# Patient Record
Sex: Female | Born: 1980
Health system: Southern US, Community
[De-identification: ages and names within clinical notes are randomized; demographics above are authoritative.]

## PROBLEM LIST (undated history)

## (undated) DIAGNOSIS — D649 Anemia, unspecified: Secondary | ICD-10-CM

## (undated) DIAGNOSIS — K219 Gastro-esophageal reflux disease without esophagitis: Secondary | ICD-10-CM

## (undated) DIAGNOSIS — J449 Chronic obstructive pulmonary disease, unspecified: Secondary | ICD-10-CM

## (undated) DIAGNOSIS — I429 Cardiomyopathy, unspecified: Secondary | ICD-10-CM

## (undated) DIAGNOSIS — M1990 Unspecified osteoarthritis, unspecified site: Secondary | ICD-10-CM

## (undated) DIAGNOSIS — F329 Major depressive disorder, single episode, unspecified: Secondary | ICD-10-CM

## (undated) DIAGNOSIS — F32A Depression, unspecified: Secondary | ICD-10-CM

## (undated) DIAGNOSIS — R51 Headache: Secondary | ICD-10-CM

## (undated) DIAGNOSIS — C50919 Malignant neoplasm of unspecified site of unspecified female breast: Secondary | ICD-10-CM

## (undated) DIAGNOSIS — R011 Cardiac murmur, unspecified: Secondary | ICD-10-CM

## (undated) DIAGNOSIS — Z9221 Personal history of antineoplastic chemotherapy: Secondary | ICD-10-CM

## (undated) DIAGNOSIS — Z809 Family history of malignant neoplasm, unspecified: Secondary | ICD-10-CM

## (undated) DIAGNOSIS — R519 Headache, unspecified: Secondary | ICD-10-CM

## (undated) DIAGNOSIS — Z1371 Encounter for nonprocreative screening for genetic disease carrier status: Secondary | ICD-10-CM

## (undated) DIAGNOSIS — J42 Unspecified chronic bronchitis: Secondary | ICD-10-CM

## (undated) HISTORY — DX: Cardiomyopathy, unspecified: I42.9

## (undated) HISTORY — DX: Encounter for nonprocreative screening for genetic disease carrier status: Z13.71

## (undated) HISTORY — DX: Depression, unspecified: F32.A

## (undated) HISTORY — DX: Malignant neoplasm of unspecified site of unspecified female breast: C50.919

## (undated) HISTORY — DX: Gastro-esophageal reflux disease without esophagitis: K21.9

## (undated) HISTORY — DX: Major depressive disorder, single episode, unspecified: F32.9

## (undated) HISTORY — DX: Family history of malignant neoplasm, unspecified: Z80.9

---

## 2008-05-25 ENCOUNTER — Inpatient Hospital Stay: Payer: Self-pay | Admitting: Obstetrics and Gynecology

## 2014-10-03 DIAGNOSIS — R519 Headache, unspecified: Secondary | ICD-10-CM | POA: Insufficient documentation

## 2014-10-03 DIAGNOSIS — F411 Generalized anxiety disorder: Secondary | ICD-10-CM | POA: Insufficient documentation

## 2016-07-22 ENCOUNTER — Ambulatory Visit
Admission: RE | Admit: 2016-07-22 | Discharge: 2016-07-22 | Disposition: A | Discharge status: Home / Self Care | Payer: BLUE CROSS/BLUE SHIELD | Source: Ambulatory Visit | Attending: Family Medicine | Admitting: Family Medicine

## 2016-07-22 ENCOUNTER — Other Ambulatory Visit: Payer: Self-pay | Admitting: Family Medicine

## 2016-07-22 DIAGNOSIS — R05 Cough: Secondary | ICD-10-CM | POA: Diagnosis present

## 2016-07-22 DIAGNOSIS — R059 Cough, unspecified: Secondary | ICD-10-CM

## 2016-07-22 IMAGING — CR DG CHEST 2V
1 series · 2 of 2 positions shown · non-contrast
Comparison: None.

CLINICAL DATA: Productive cough for several weeks, initial
encounter

EXAM:
CHEST  2 VIEW

[Series 1: w chest pa · 0.14mm/px · 2 of 2 slices shown]
[im 1/2]
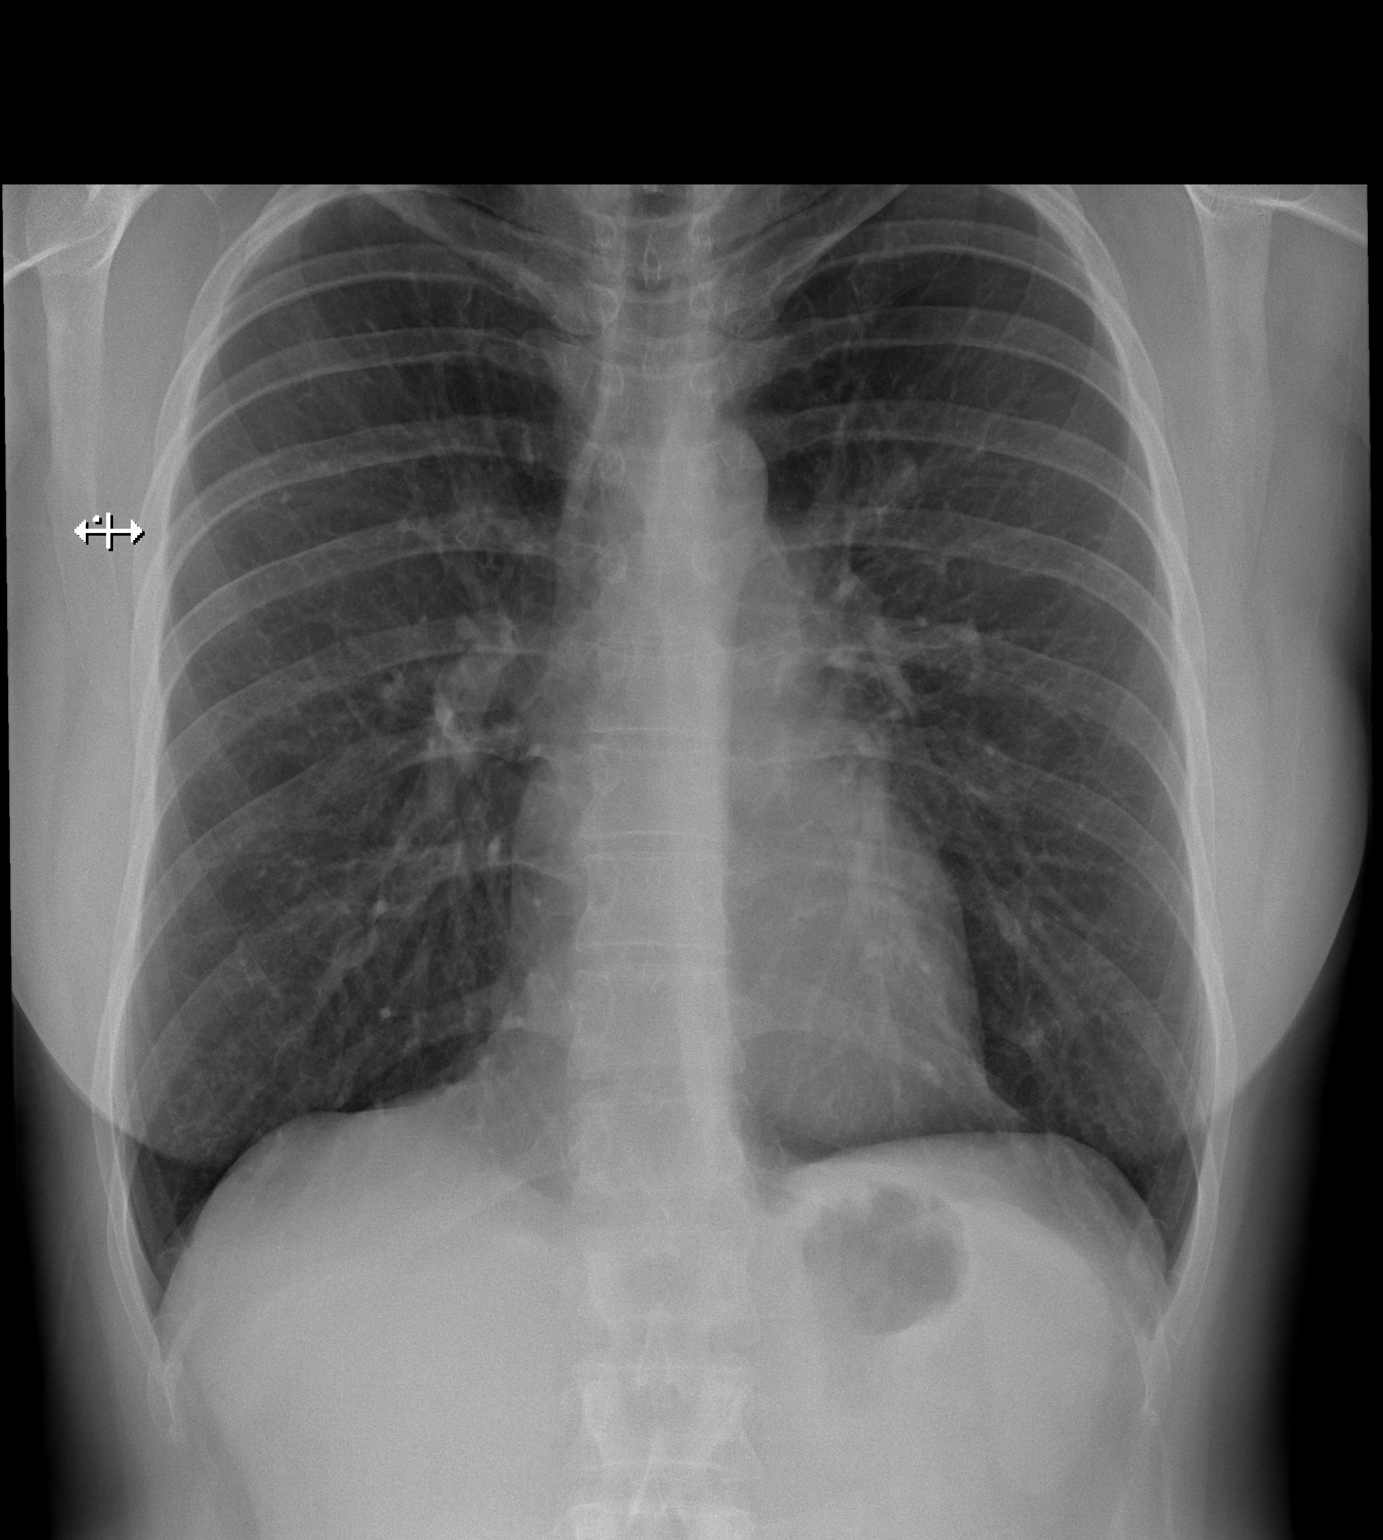
[im 2/2]
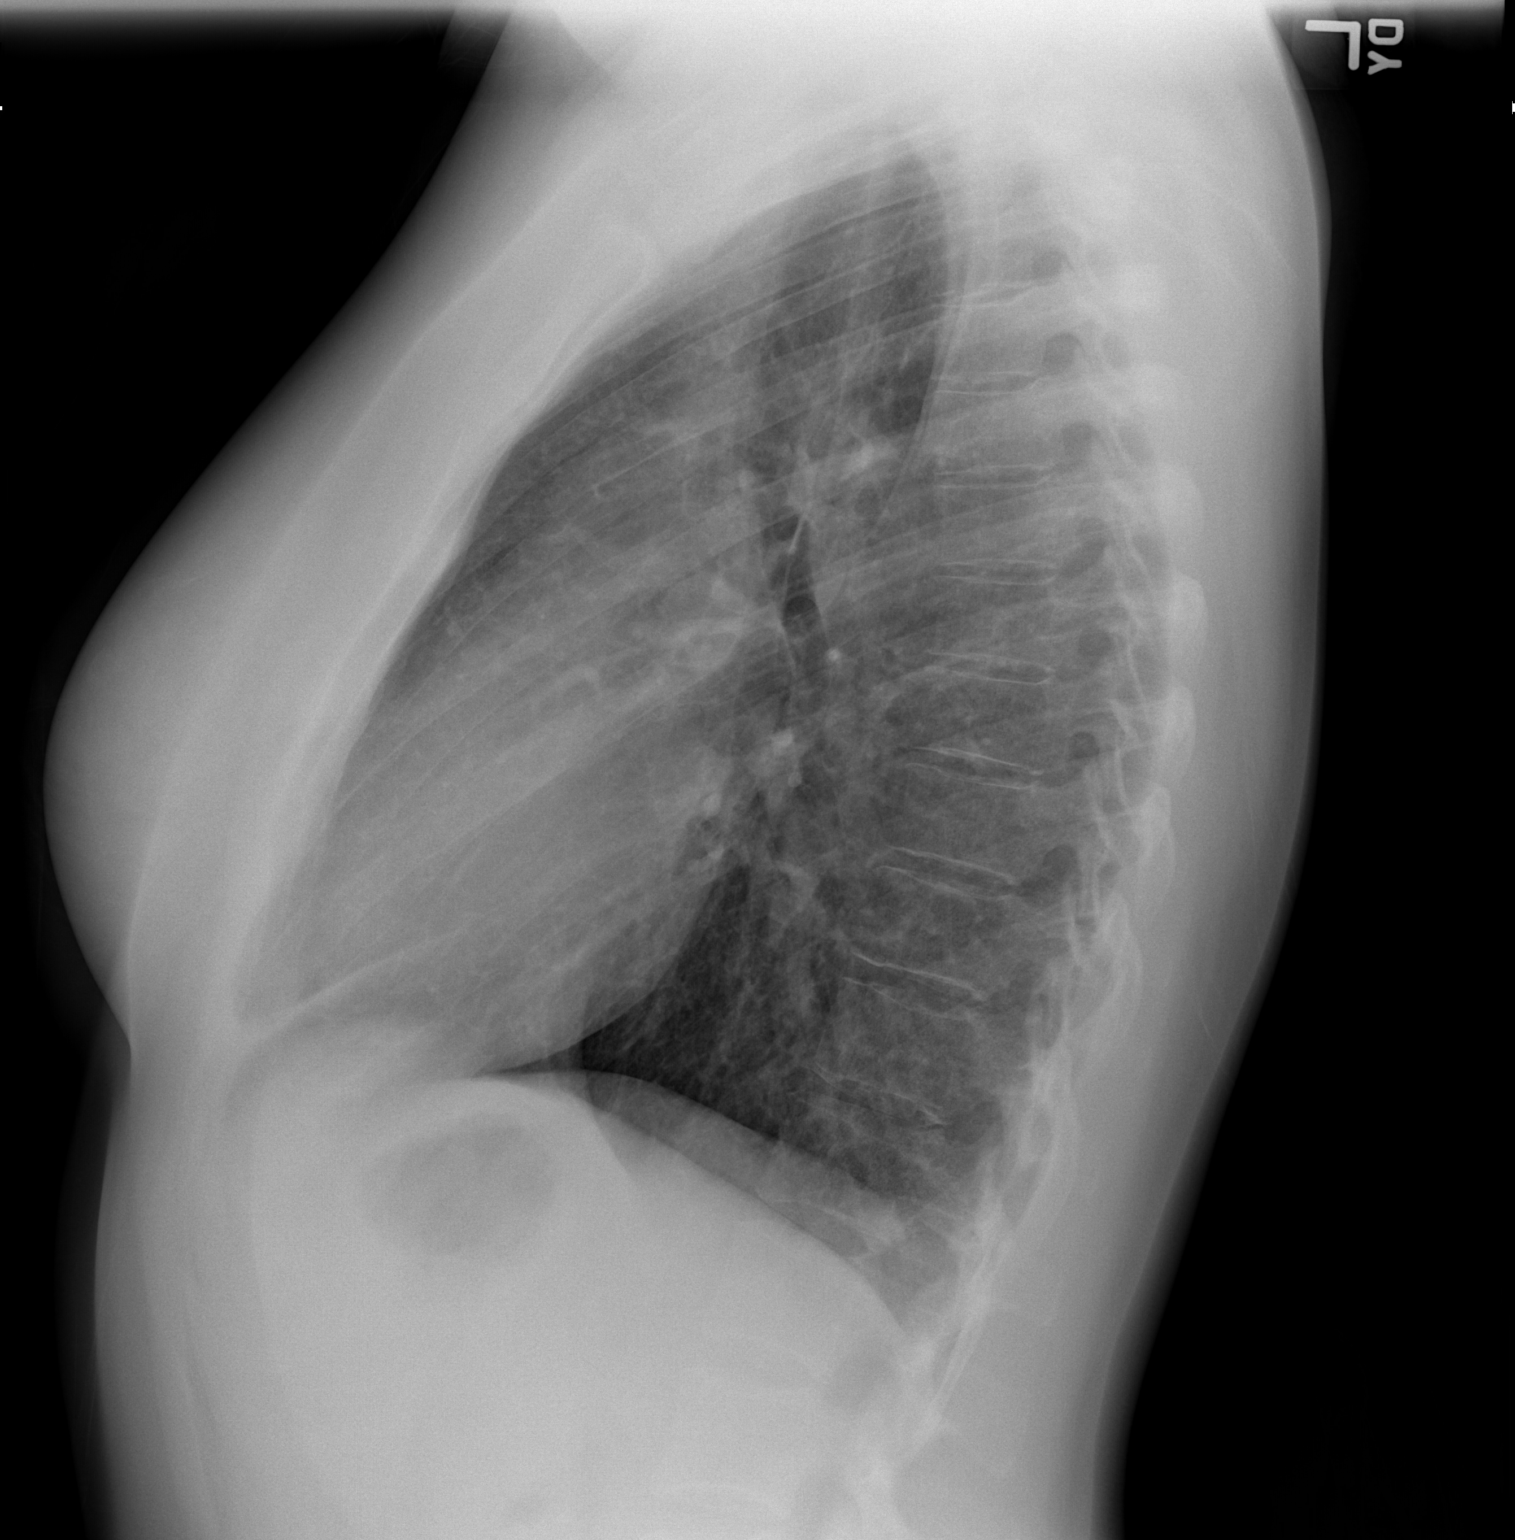

[2 of 2 positions shown; findings below may reference images not displayed]

FINDINGS: The heart size and mediastinal contours are within normal limits.
Both lungs are clear. The visualized skeletal structures are
unremarkable.
IMPRESSION: No active cardiopulmonary disease.

## 2017-02-24 DIAGNOSIS — K219 Gastro-esophageal reflux disease without esophagitis: Secondary | ICD-10-CM | POA: Insufficient documentation

## 2017-09-29 ENCOUNTER — Other Ambulatory Visit: Payer: Self-pay | Admitting: Family Medicine

## 2017-09-29 DIAGNOSIS — N6310 Unspecified lump in the right breast, unspecified quadrant: Secondary | ICD-10-CM

## 2017-10-05 ENCOUNTER — Ambulatory Visit
Admission: RE | Admit: 2017-10-05 | Discharge: 2017-10-05 | Disposition: A | Discharge status: Home / Self Care | Payer: BLUE CROSS/BLUE SHIELD | Source: Ambulatory Visit | Attending: Family Medicine | Admitting: Family Medicine

## 2017-10-05 DIAGNOSIS — N6311 Unspecified lump in the right breast, upper outer quadrant: Secondary | ICD-10-CM | POA: Insufficient documentation

## 2017-10-05 DIAGNOSIS — N6310 Unspecified lump in the right breast, unspecified quadrant: Secondary | ICD-10-CM

## 2017-10-05 DIAGNOSIS — R921 Mammographic calcification found on diagnostic imaging of breast: Secondary | ICD-10-CM | POA: Insufficient documentation

## 2017-10-05 IMAGING — US US BREAST*R* LIMITED INC AXILLA
1 series · 10 of 10 positions shown · non-contrast
Comparison: None.

ADDENDUM:
Right axilla was evaluated with ultrasound showing no enlarged or
morphologically abnormal lymph nodes.
CLINICAL DATA: 36-year-old female with palpable right breast mass.

EXAM:
2D DIGITAL DIAGNOSTIC BILATERAL MAMMOGRAM WITH CAD AND ADJUNCT TOMO
ULTRASOUND RIGHT BREAST

[Series 1: us breast*right* limited inc axilla · 0.07mm/px · 10 of 10 slices shown]
[im 1/10]
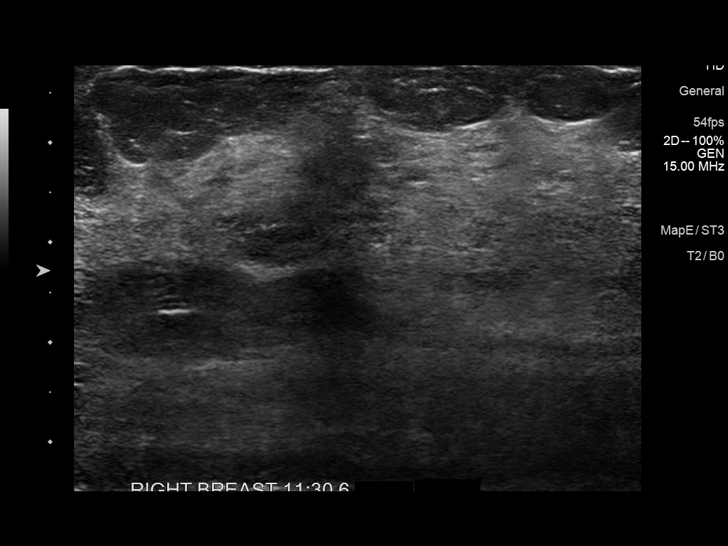
[im 2/10]
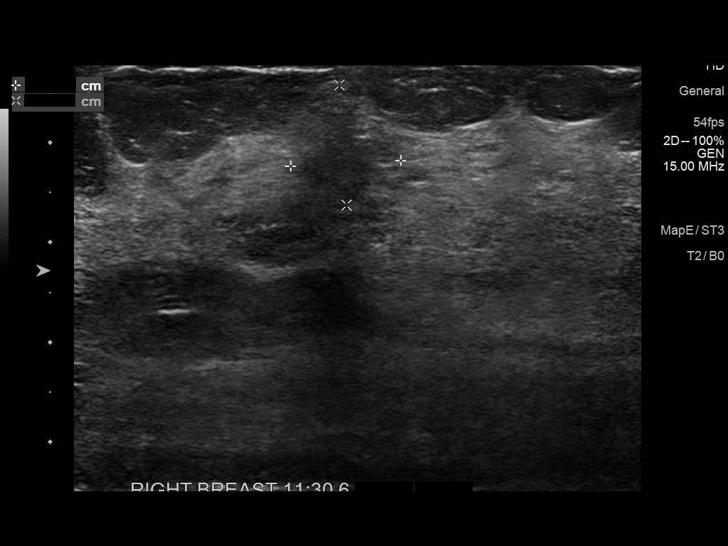
[im 3/10]
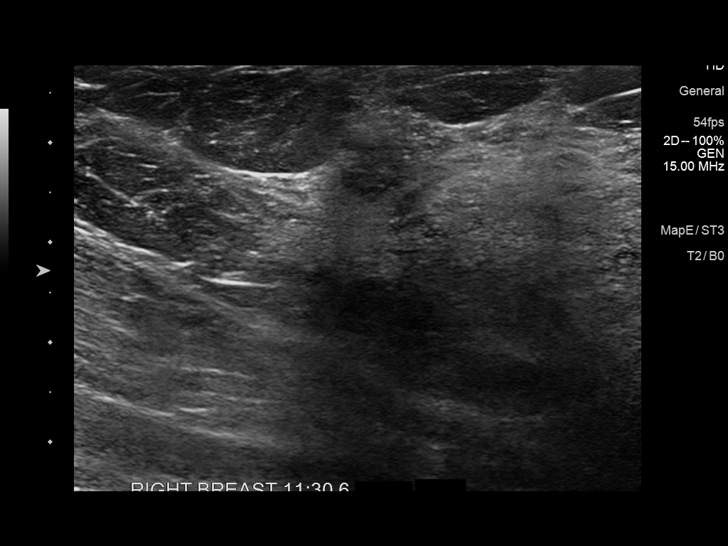
[im 4/10]
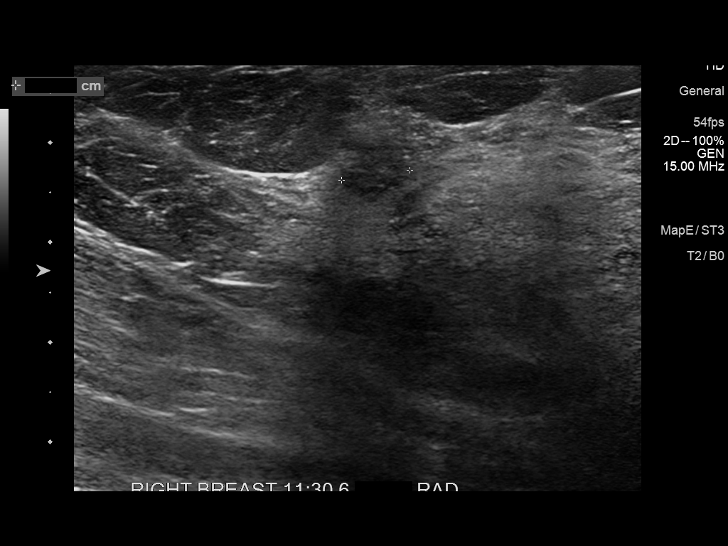
[im 5/10]
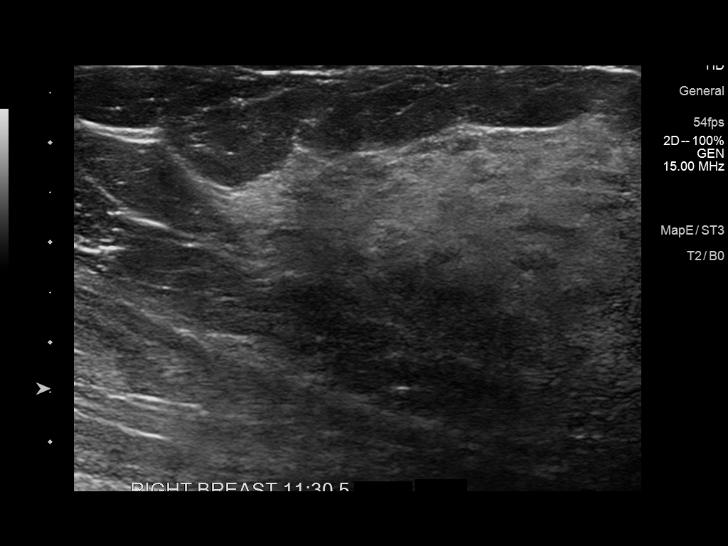
[im 6/10]
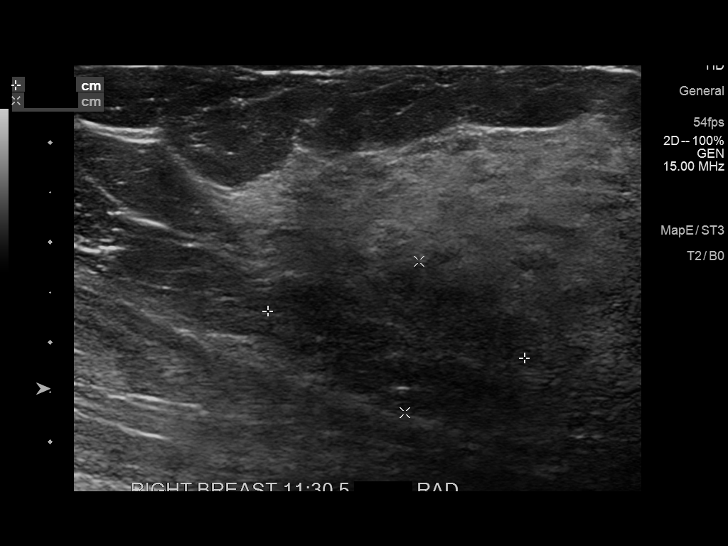
[im 7/10]
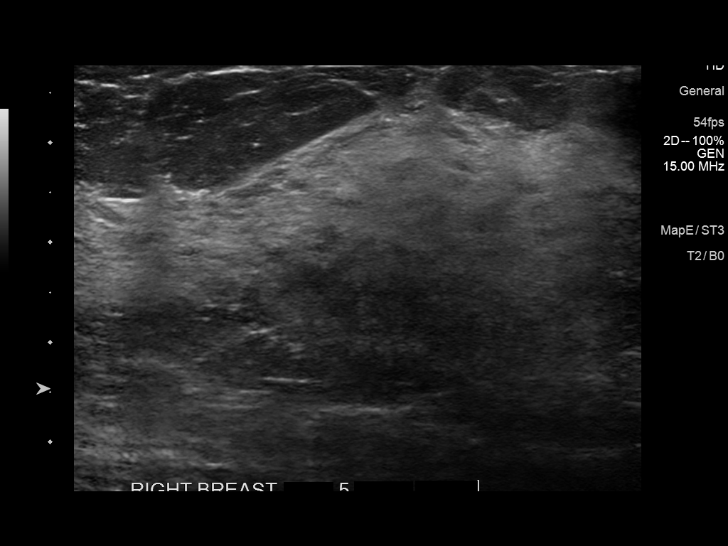
[im 8/10]
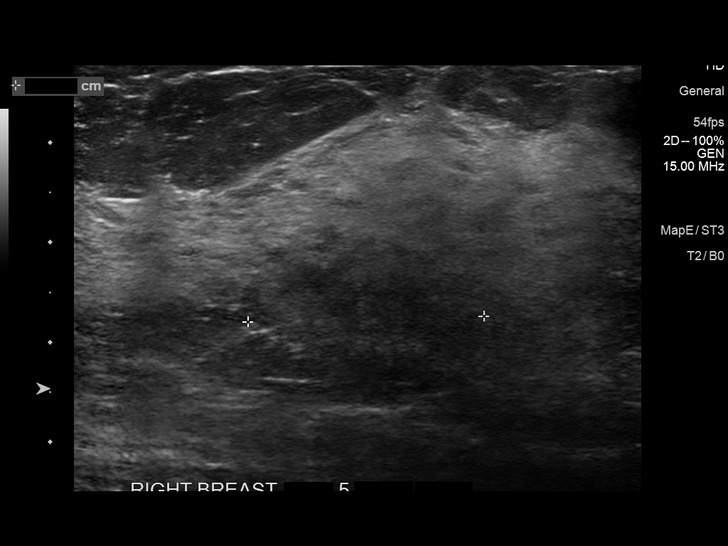
[im 9/10]
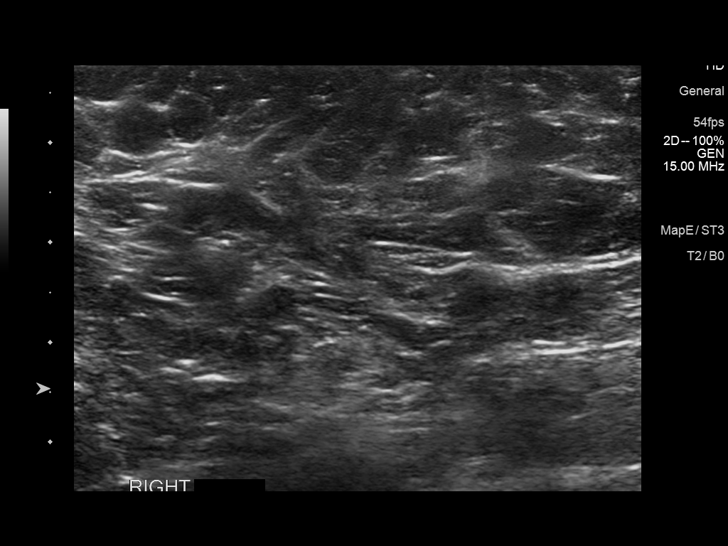
[im 10/10]
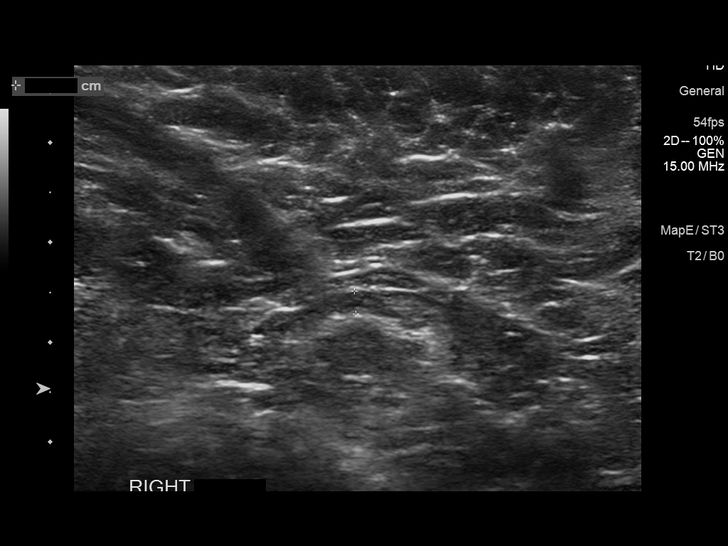

[10 of 10 positions shown; findings below may reference images not displayed]

ACR Breast Density Category c: The breast tissue is heterogeneously
dense, which may obscure small masses.
FINDINGS: Bilateral 2D CC and MLO views were obtained today, with additional
3D tomosynthesis, and with additional spot compression view of the
upper right breast corresponding to the area of clinical concern.

There is an irregular mass with architectural distortion within the
upper right breast, at posterior depth, measuring approximately 2 cm
greatest dimension, with associated architectural distortion and
pleomorphic microcalcifications.

Additional grouped pleomorphic and fine linear branching
calcifications are identified within the upper right breast, 12
o'clock axis region, measuring 1.4 cm extent.

Additional suspicious pleomorphic calcifications are identified
within the upper retroareolar right breast extending to the
subareolar right breast, spanning 3.7 cm as measured on the true
lateral view.

Mammographic images were processed with CAD.

On physical exam, firm palpable mass is identified within the upper
right breast.

Targeted ultrasound is performed, showing an irregular hypoechoic
mass in the right breast at the 11:30 o'clock axis, 6 cm from the
nipple, measuring 1.2 x 1.1 x 0.7 cm, with associated
microcalcifications, a likely correlate for the 1.4 cm extent of
pleomorphic and fine linear calcifications seen in the upper right
breast on mammogram.

Additional vague hypoechoic masslike area is identified within the
right breast at the 11:30 o'clock axis, 5 cm from the nipple, at
posterior depth, measuring 2.6 x 2.4 x 1.5 cm, a likely correlate
for the dominant mass seen on mammogram, better seen by mammogram.
IMPRESSION: 1. Highly suspicious irregular mass with architectural distortion in
the upper RIGHT breast, at POSTERIOR depth, measuring approximately
2 cm greatest dimension with associated architectural distortion and
pleomorphic microcalcifications. On ultrasound, a vague hypoechoic
masslike area is seen in the right breast at the 11:30 o'clock axis,
5 cm from the nipple, measuring 2.6 cm, a likely correlate for the
dominant mass seen on mammogram, but better seen on mammogram. As
such, stereotactic biopsy with 3D tomosynthesis guidance is
recommended for this dominant mass.
2. Irregular hypoechoic mass in the RIGHT breast at the 11:30
o'clock axis, 6 cm from the nipple, at SUPERFICIAL depth, measuring
1.2 cm, a likely correlate for the highly suspicious 1.4 cm extent
of pleomorphic and fine linear branching calcifications identified
in the upper right breast on mammogram. Recommend ultrasound-guided
biopsy to define upper extent of disease. This could be performed as
an additional stereotactic biopsy at the discretion of the
performing radiologist.
3. Additional highly suspicious pleomorphic calcifications within
the upper retroareolar right breast extending to the SUBAREOLAR
right breast, measuring 3.7 cm extent. Recommend a third same day
biopsy of the most anterior calcifications, using stereotactic
guidance, to define anterior extent of disease.

RECOMMENDATION:
Three right breast biopsies, as detailed above.

Ordering physician will be contacted with today's results and
patient will then be scheduled for biopsies at her earliest
convenience.

I have discussed the findings and recommendations with the patient.
Results were also provided in writing at the conclusion of the
visit. If applicable, a reminder letter will be sent to the patient
regarding the next appointment.

BI-RADS CATEGORY  5: Highly suggestive of malignancy.

## 2017-10-05 IMAGING — MG MM DIAG BREAST TOMO BILATERAL
8 of 11 series · 8 of 11 positions shown · non-contrast
Comparison: None.

ADDENDUM:
Right axilla was evaluated with ultrasound showing no enlarged or
morphologically abnormal lymph nodes.
CLINICAL DATA: 36-year-old female with palpable right breast mass.

EXAM:
2D DIGITAL DIAGNOSTIC BILATERAL MAMMOGRAM WITH CAD AND ADJUNCT TOMO
ULTRASOUND RIGHT BREAST

[R ML (1 of 4)]
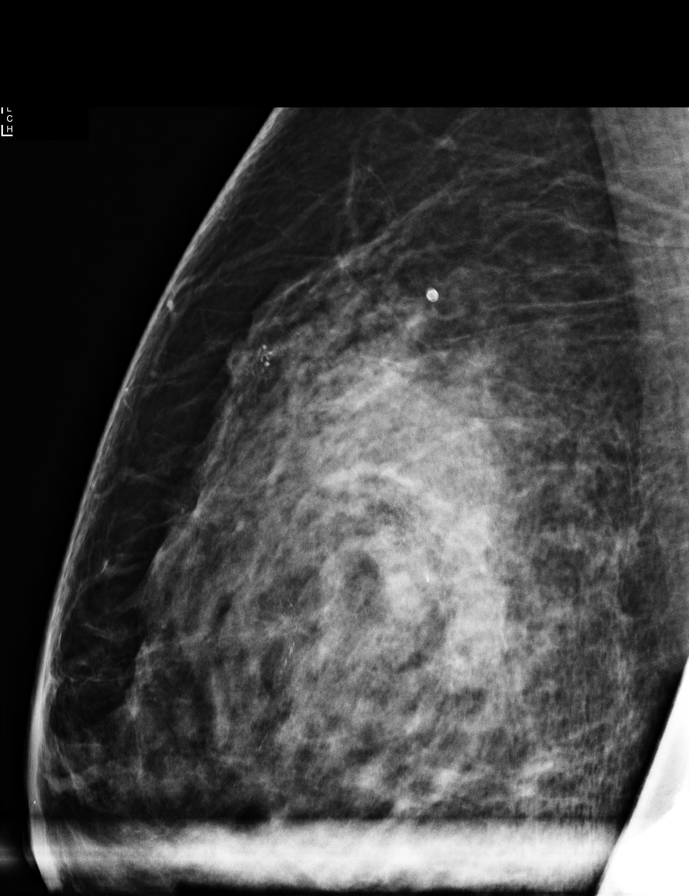

[R CC (1 of 3)]
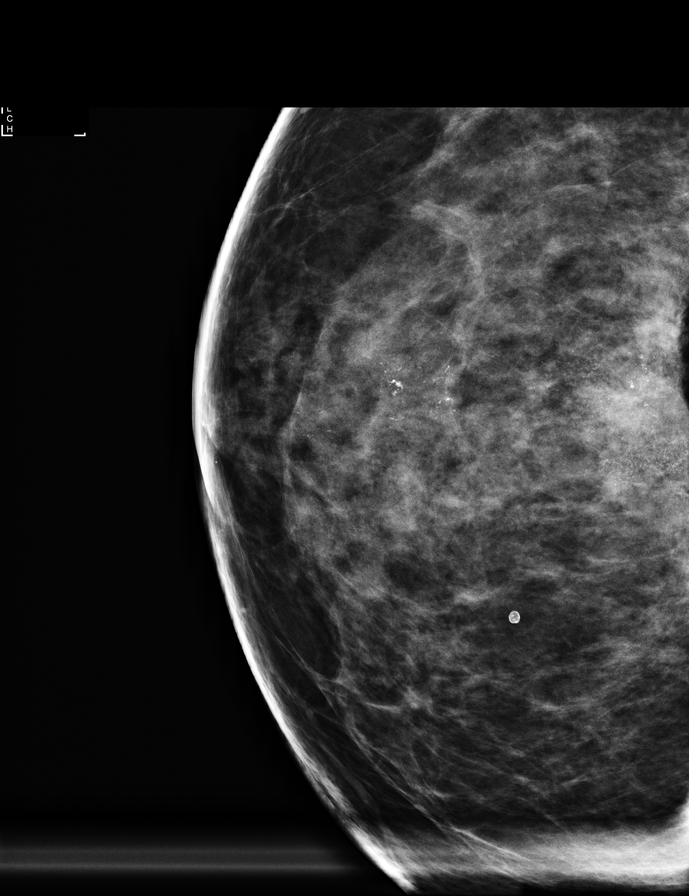

[R ML (2 of 4)]
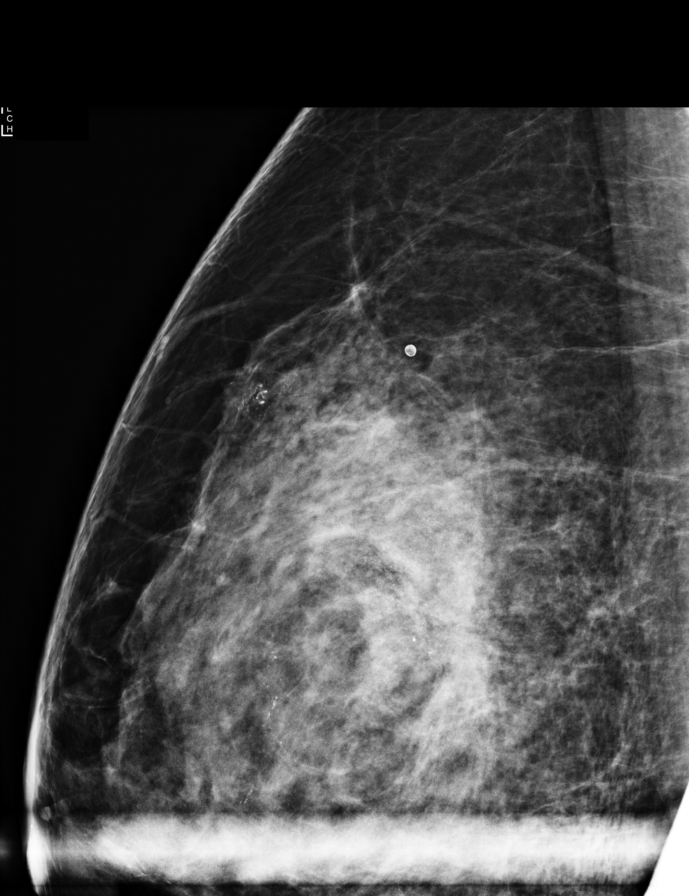

[R ML (3 of 4)]
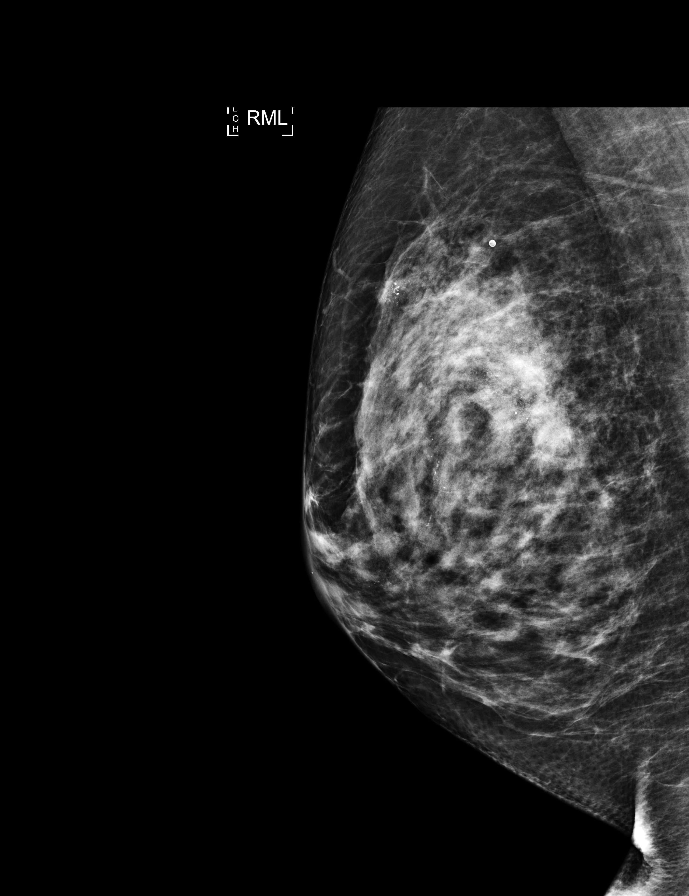

[R CC (2 of 3)]
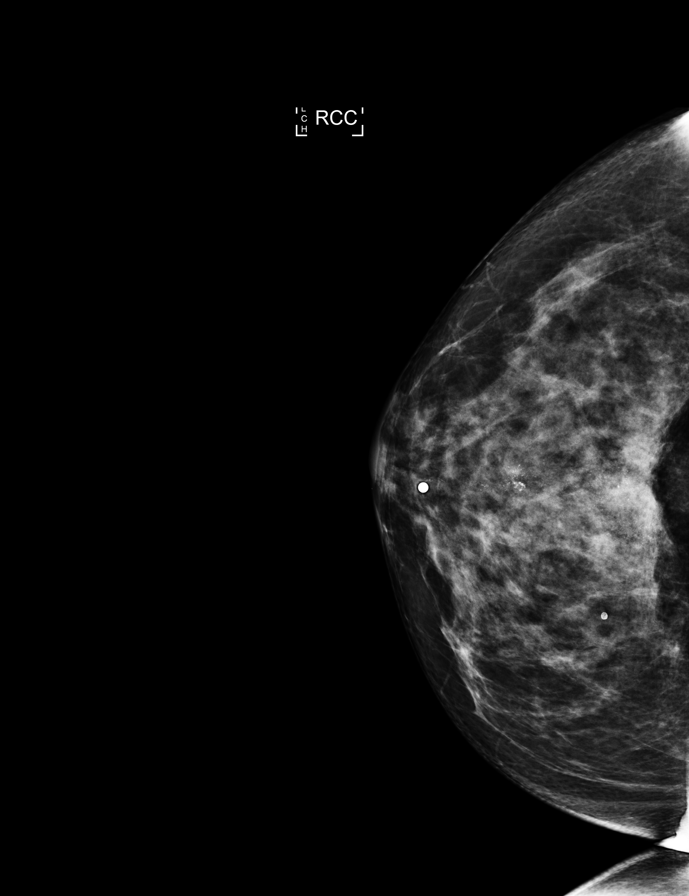

[R CC (3 of 3)]
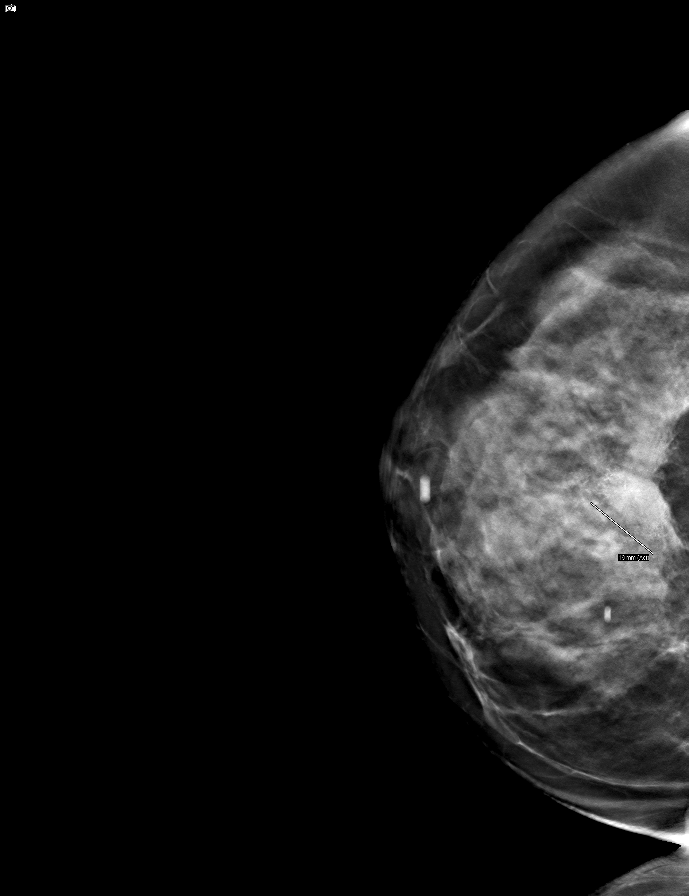

[R MLO]
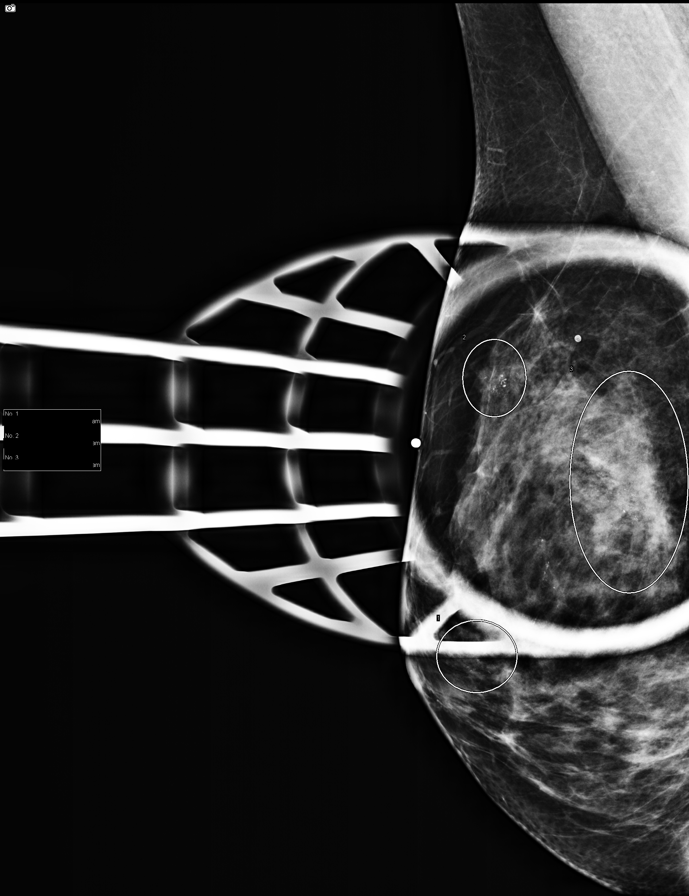

[R ML (4 of 4)]
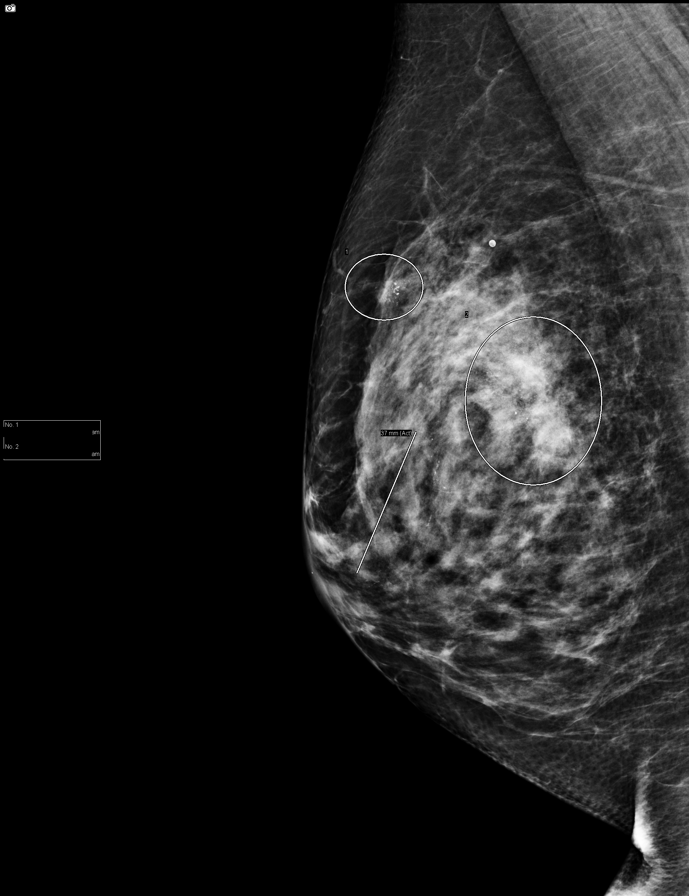

[8 of 11 positions shown; findings below may reference images not displayed]

ACR Breast Density Category c: The breast tissue is heterogeneously
dense, which may obscure small masses.
FINDINGS: Bilateral 2D CC and MLO views were obtained today, with additional
3D tomosynthesis, and with additional spot compression view of the
upper right breast corresponding to the area of clinical concern.

There is an irregular mass with architectural distortion within the
upper right breast, at posterior depth, measuring approximately 2 cm
greatest dimension, with associated architectural distortion and
pleomorphic microcalcifications.

Additional grouped pleomorphic and fine linear branching
calcifications are identified within the upper right breast, 12
o'clock axis region, measuring 1.4 cm extent.

Additional suspicious pleomorphic calcifications are identified
within the upper retroareolar right breast extending to the
subareolar right breast, spanning 3.7 cm as measured on the true
lateral view.

Mammographic images were processed with CAD.

On physical exam, firm palpable mass is identified within the upper
right breast.

Targeted ultrasound is performed, showing an irregular hypoechoic
mass in the right breast at the 11:30 o'clock axis, 6 cm from the
nipple, measuring 1.2 x 1.1 x 0.7 cm, with associated
microcalcifications, a likely correlate for the 1.4 cm extent of
pleomorphic and fine linear calcifications seen in the upper right
breast on mammogram.

Additional vague hypoechoic masslike area is identified within the
right breast at the 11:30 o'clock axis, 5 cm from the nipple, at
posterior depth, measuring 2.6 x 2.4 x 1.5 cm, a likely correlate
for the dominant mass seen on mammogram, better seen by mammogram.
IMPRESSION: 1. Highly suspicious irregular mass with architectural distortion in
the upper RIGHT breast, at POSTERIOR depth, measuring approximately
2 cm greatest dimension with associated architectural distortion and
pleomorphic microcalcifications. On ultrasound, a vague hypoechoic
masslike area is seen in the right breast at the 11:30 o'clock axis,
5 cm from the nipple, measuring 2.6 cm, a likely correlate for the
dominant mass seen on mammogram, but better seen on mammogram. As
such, stereotactic biopsy with 3D tomosynthesis guidance is
recommended for this dominant mass.
2. Irregular hypoechoic mass in the RIGHT breast at the 11:30
o'clock axis, 6 cm from the nipple, at SUPERFICIAL depth, measuring
1.2 cm, a likely correlate for the highly suspicious 1.4 cm extent
of pleomorphic and fine linear branching calcifications identified
in the upper right breast on mammogram. Recommend ultrasound-guided
biopsy to define upper extent of disease. This could be performed as
an additional stereotactic biopsy at the discretion of the
performing radiologist.
3. Additional highly suspicious pleomorphic calcifications within
the upper retroareolar right breast extending to the SUBAREOLAR
right breast, measuring 3.7 cm extent. Recommend a third same day
biopsy of the most anterior calcifications, using stereotactic
guidance, to define anterior extent of disease.

RECOMMENDATION:
Three right breast biopsies, as detailed above.

Ordering physician will be contacted with today's results and
patient will then be scheduled for biopsies at her earliest
convenience.

I have discussed the findings and recommendations with the patient.
Results were also provided in writing at the conclusion of the
visit. If applicable, a reminder letter will be sent to the patient
regarding the next appointment.

BI-RADS CATEGORY  5: Highly suggestive of malignancy.

## 2017-10-10 ENCOUNTER — Other Ambulatory Visit: Payer: Self-pay | Admitting: Family Medicine

## 2017-10-10 DIAGNOSIS — R921 Mammographic calcification found on diagnostic imaging of breast: Secondary | ICD-10-CM

## 2017-10-10 DIAGNOSIS — N6310 Unspecified lump in the right breast, unspecified quadrant: Secondary | ICD-10-CM

## 2017-10-10 DIAGNOSIS — R928 Other abnormal and inconclusive findings on diagnostic imaging of breast: Secondary | ICD-10-CM

## 2017-10-11 ENCOUNTER — Other Ambulatory Visit: Payer: Self-pay | Admitting: Family Medicine

## 2017-10-11 ENCOUNTER — Ambulatory Visit
Admission: RE | Admit: 2017-10-11 | Discharge: 2017-10-11 | Disposition: A | Discharge status: Home / Self Care | Payer: BLUE CROSS/BLUE SHIELD | Source: Ambulatory Visit | Attending: Family Medicine | Admitting: Family Medicine

## 2017-10-11 DIAGNOSIS — R928 Other abnormal and inconclusive findings on diagnostic imaging of breast: Secondary | ICD-10-CM

## 2017-10-11 DIAGNOSIS — N6310 Unspecified lump in the right breast, unspecified quadrant: Secondary | ICD-10-CM

## 2017-10-11 DIAGNOSIS — C50919 Malignant neoplasm of unspecified site of unspecified female breast: Secondary | ICD-10-CM

## 2017-10-11 DIAGNOSIS — R921 Mammographic calcification found on diagnostic imaging of breast: Secondary | ICD-10-CM | POA: Insufficient documentation

## 2017-10-11 HISTORY — PX: BREAST BIOPSY: SHX20

## 2017-10-11 HISTORY — DX: Malignant neoplasm of unspecified site of unspecified female breast: C50.919

## 2017-10-11 IMAGING — MG MM BREAST LOCALIZATION CLIP
6 series · 6 of 14 positions shown · non-contrast
Comparison: Previous exam(s).

CLINICAL DATA: Post stereotactic core needle biopsy of 3 sites in
the right breast.

EXAM:
DIAGNOSTIC RIGHT MAMMOGRAM POST STEREOTACTIC BIOPSY

[R CC]
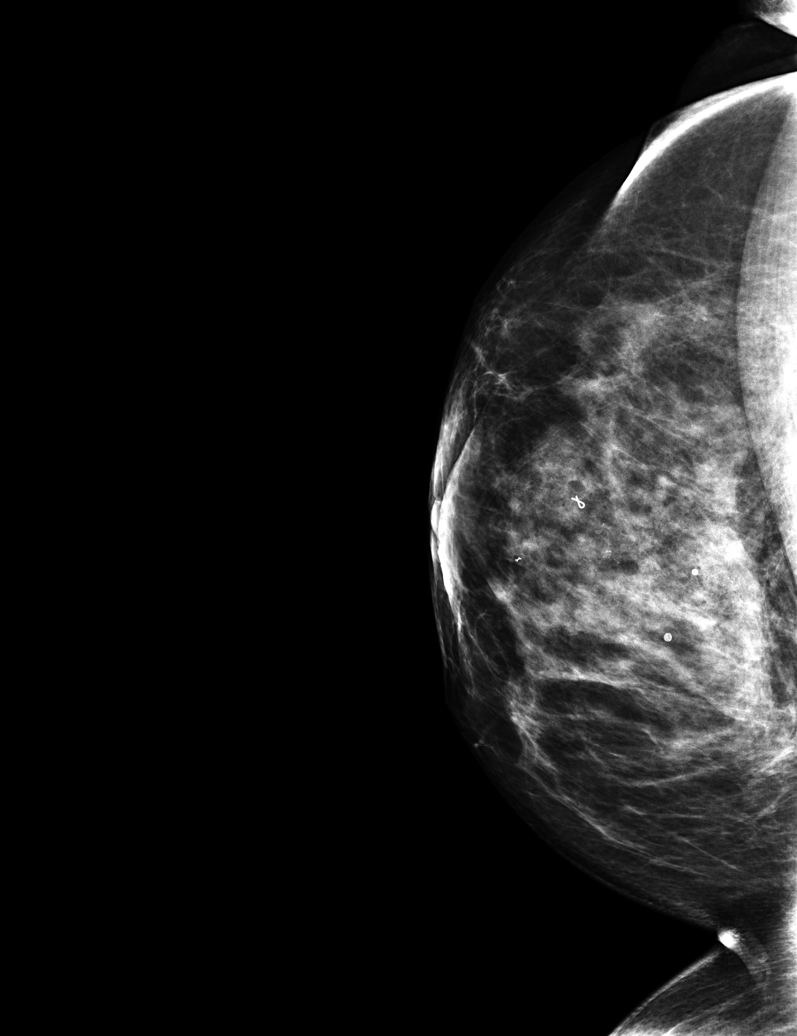

[R ML]
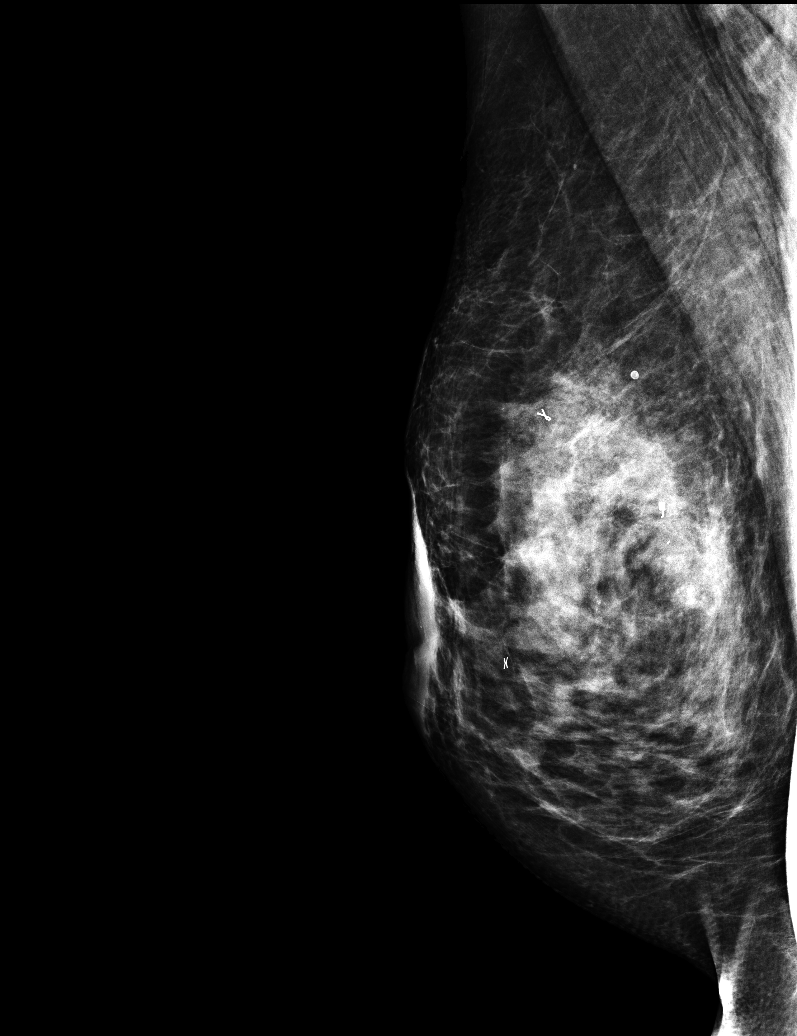

[R ML synth-2D]
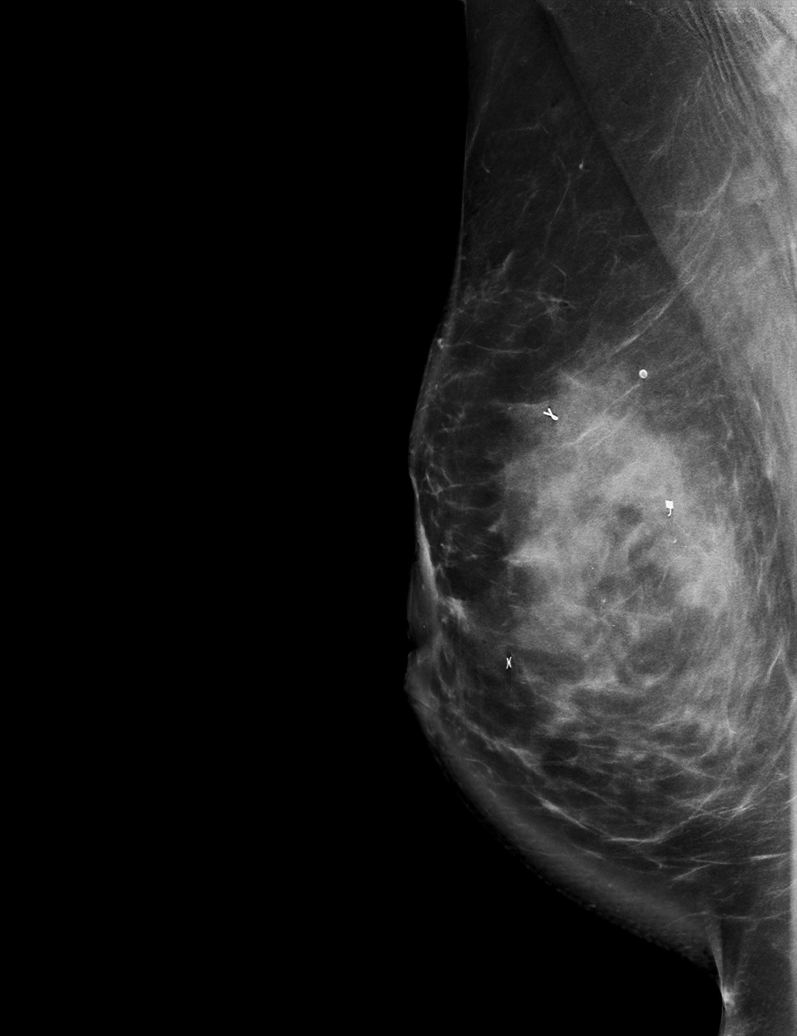

[R CC synth-2D]
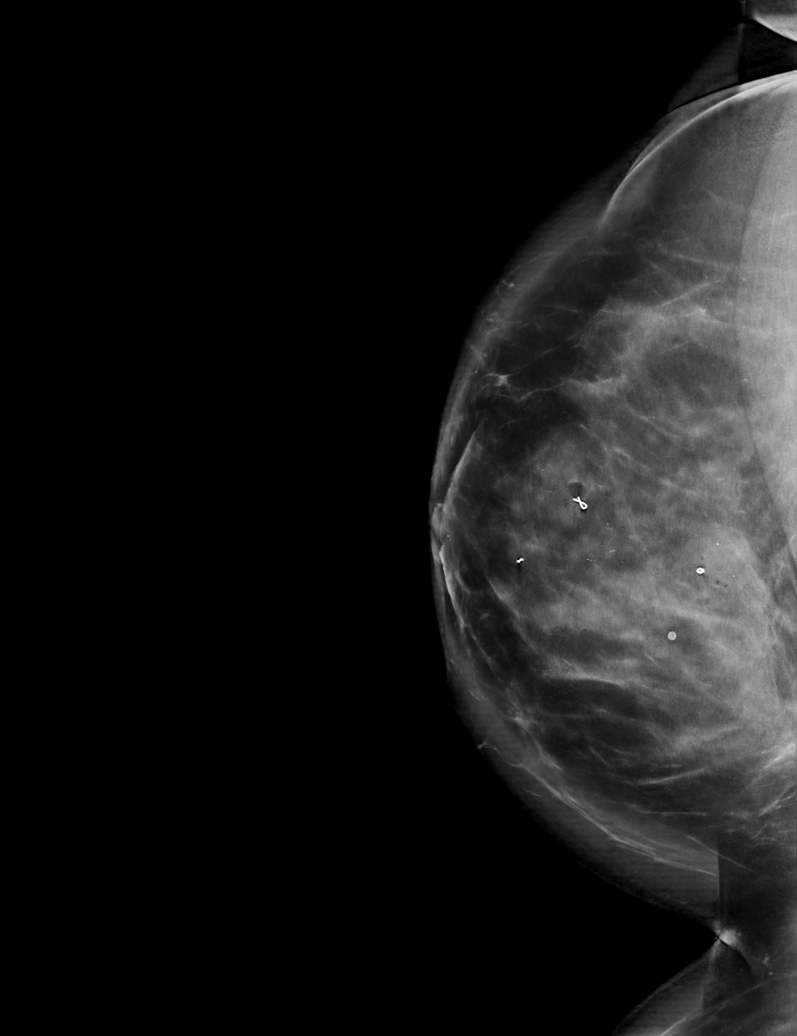

[R CC tomo · tomo slice 61/122.0]
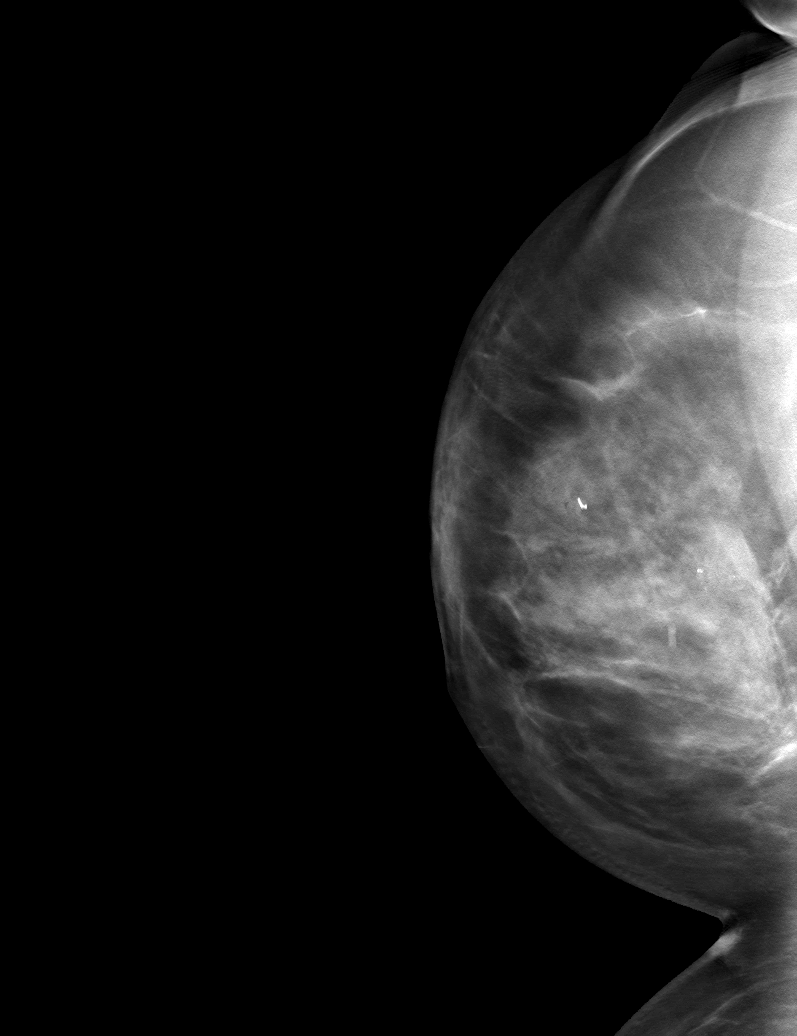

[R ML tomo · tomo slice 59/117.0]
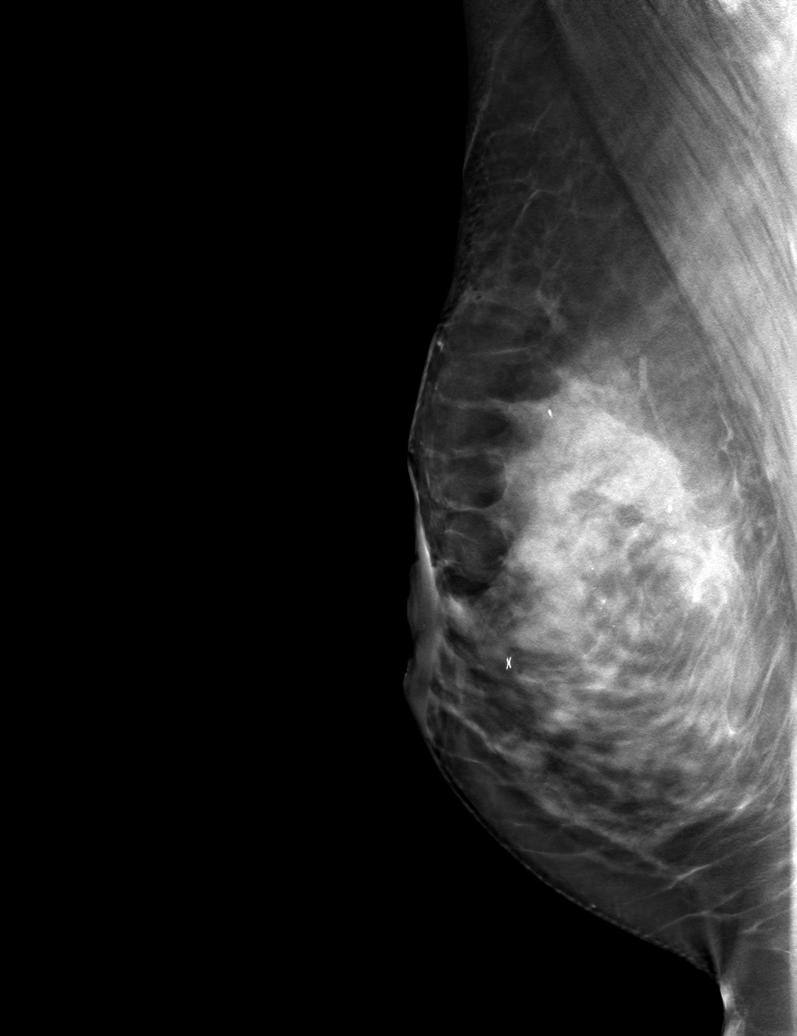

[6 of 14 positions shown; findings below may reference images not displayed]

FINDINGS: Mammographic images were obtained following stereotactic guided
biopsy of right breast. Two-view mammography demonstrates presence
of 3 tissue markers is expected:

1. Coil shaped marker at site 1, 12:30 o'clock.
2. Ribbon shaped marker has site 2, 11:30 o'clock.
3. X shaped marker, at site 3, 5:30 o'clock
IMPRESSION: Successful placement of tissue markers post 3 stereotactic core
needle biopsies of the right breast.

Final Assessment: Post Procedure Mammograms for Marker Placement

## 2017-10-11 IMAGING — MG BREAST SPECIMEN, RIGHT
8 of 9 series · 8 of 9 positions shown · non-contrast
Comparison: Previous exams.

ADDENDUM:
The pathology revealed DCIS at the right breast 11:30 o'clock,
invasive ductal carcinoma at the right breast 5:30 o'clock, invasive
mammary carcinoma at the right breast 12:30 o'clock. These are foung
to be concordant with imaging findings. I discussed the results over
the phone with the patient and answered her questions. The patient
states she is doing well post biopsy without complications. The
patient will be referred to the nurse navigator for scheduling of an
appointment for surgical consultation.
CLINICAL DATA: Right breast palpable mass and suspicious
microcalcifications.

EXAM:
RIGHT BREAST STEREOTACTIC CORE NEEDLE BIOPSY

[R (1 of 8)]
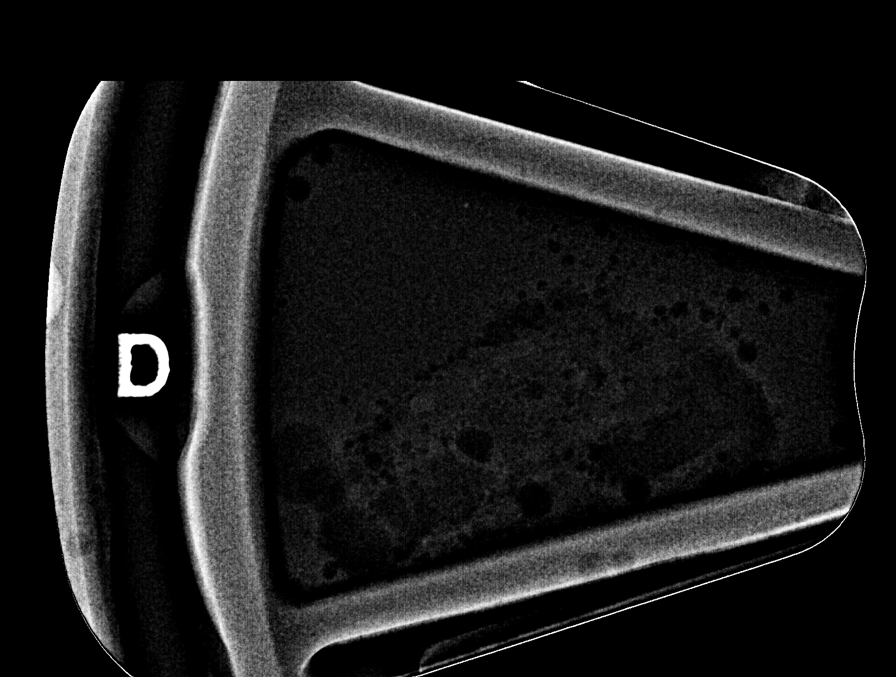

[R (2 of 8)]
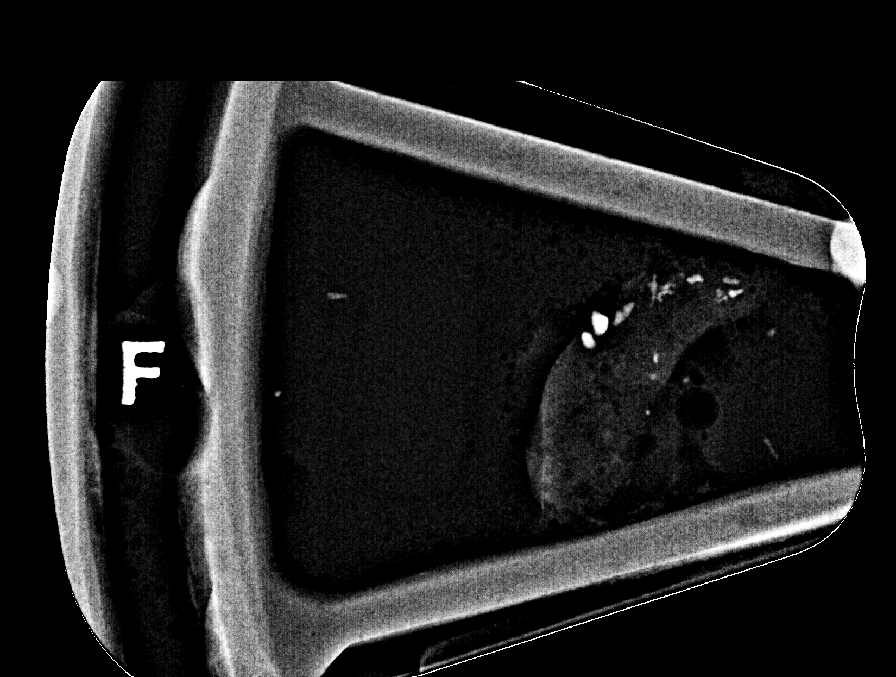

[R (3 of 8)]
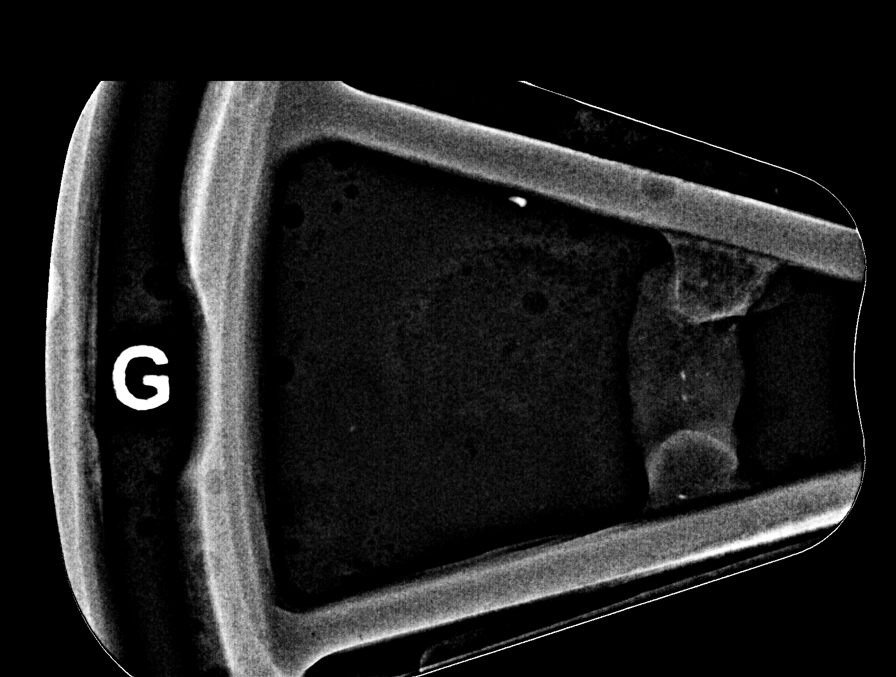

[R (4 of 8)]
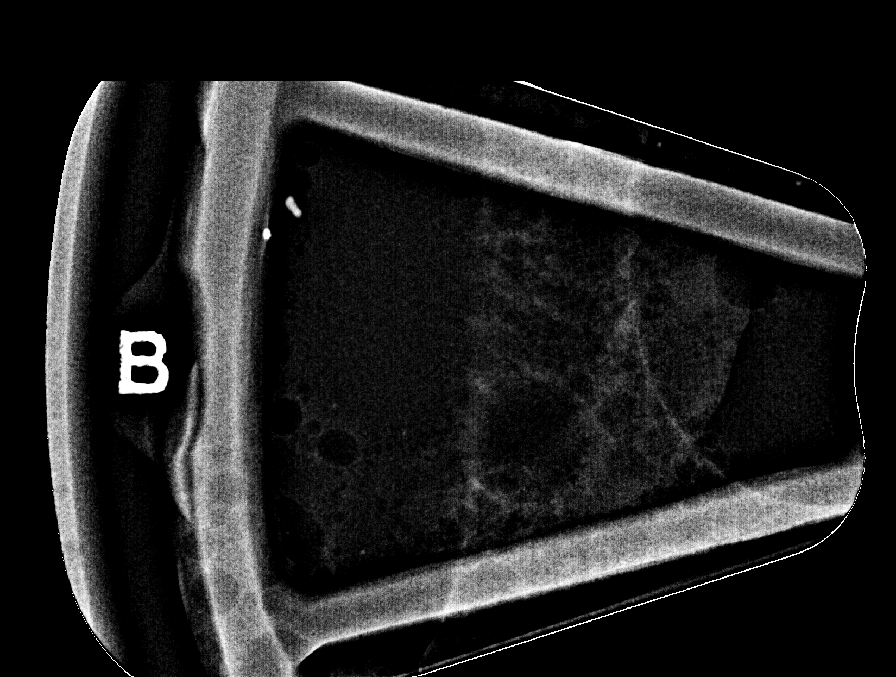

[R (5 of 8)]
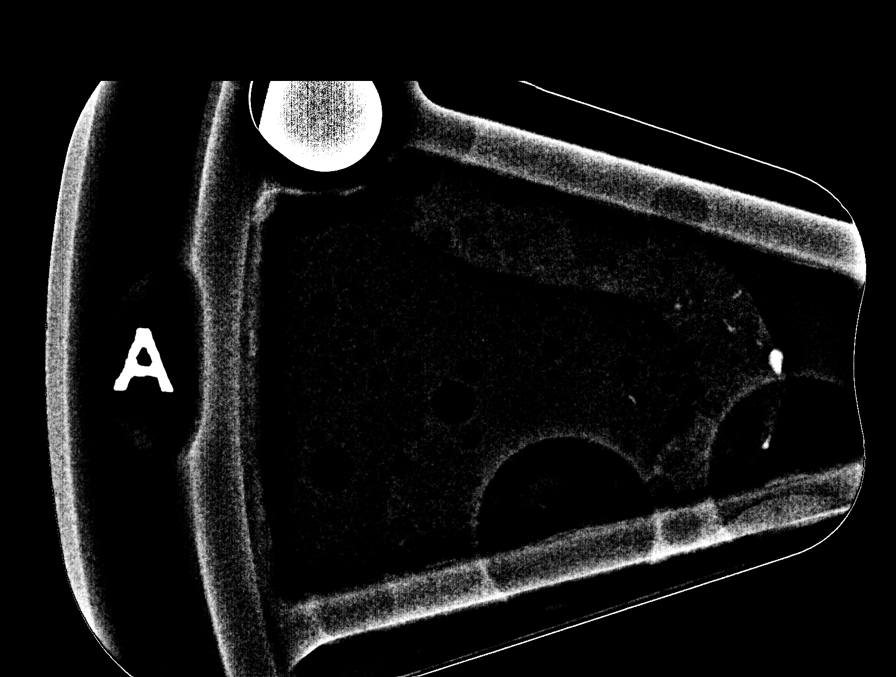

[R (6 of 8)]
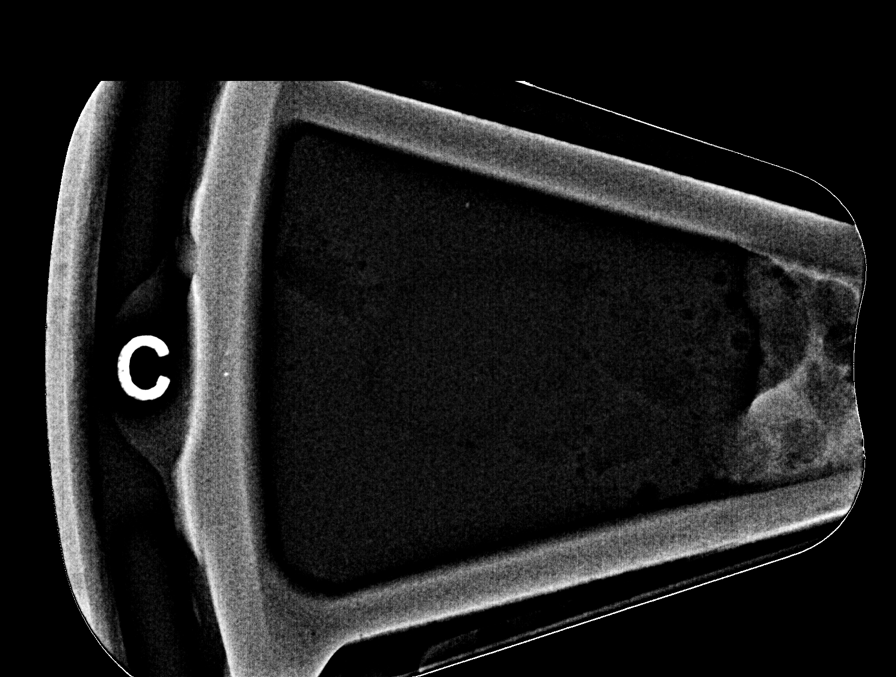

[R (7 of 8)]
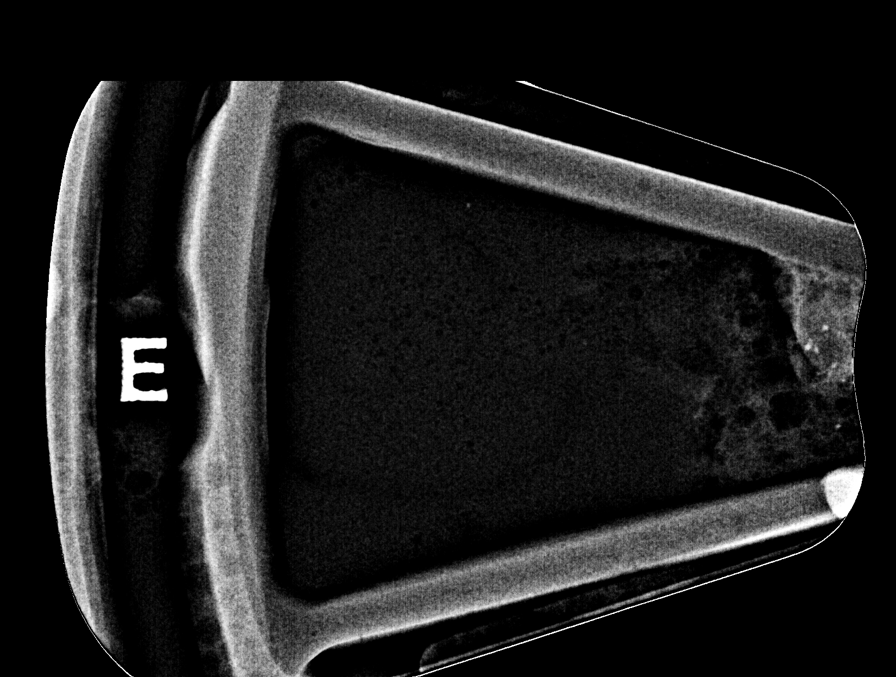

[R (8 of 8)]
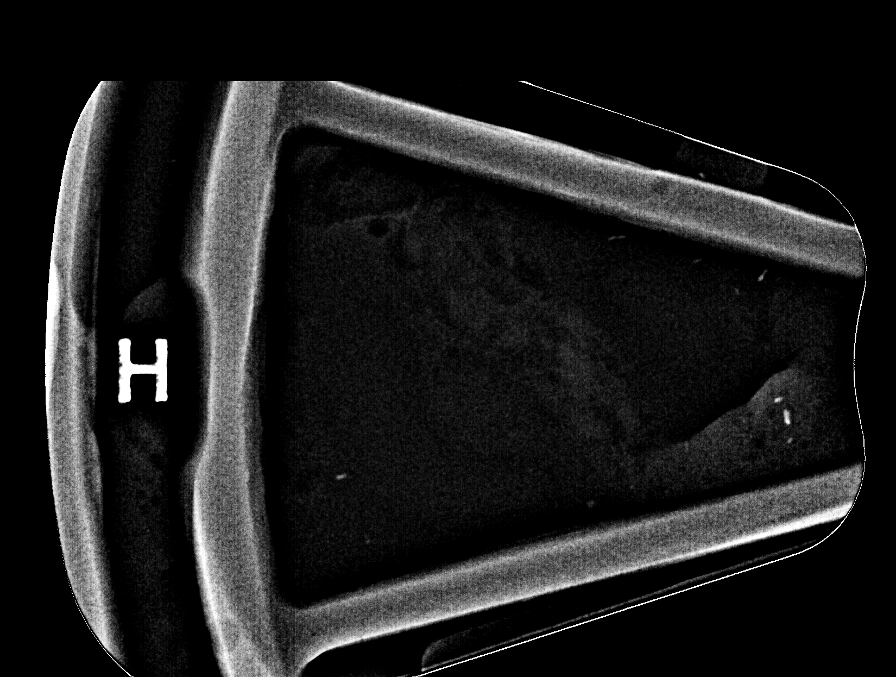

[8 of 9 positions shown; findings below may reference images not displayed]



Using sterile technique and 1% Lidocaine as local anesthetic, under
stereotactic guidance, a 9 gauge vacuum assisted device was used to
perform core needle biopsy of mass/calcifications in the right 12:30
o'clock breast using a superior approach. Specimen radiograph was
performed showing the presence of calcifications. Specimens with
calcifications are identified for pathology. Lesion quadrant: Upper
inner quadrant. At the conclusion of the procedure, a coil shaped
tissue marker clip was deployed into the biopsy cavity.

Next, using sterile technique and 1% Lidocaine as local anesthetic,
under stereotactic guidance, a 9 gauge vacuum assisted device was
used to perform core needle biopsy of calcifications in the right
11:30 o'clock breast using a superior approach. Specimen radiograph
was performed showing the presence of calcifications. Specimens with
calcifications are identified for pathology. Lesion quadrant: Upper
outer quadrant. At the conclusion of the procedure, a ribbon shaped
tissue marker clip was deployed into the biopsy cavity.

Next, using sterile technique and 1% Lidocaine as local anesthetic,
under stereotactic guidance, a 9 gauge vacuum assisted device was
used to perform core needle biopsy of calcifications in the right
5:30 o'clock breast using a superior approach. Specimen radiograph
was performed showing the presence of calcifications. Specimens with
calcifications are identified for pathology. Lesion quadrant: Lower
inner quadrant At the conclusion of the procedure, a X shaped tissue
marker clip was deployed into the biopsy cavity.

Follow-up 2-view mammogram was performed and dictated separately.
IMPRESSION: Stereotactic-guided biopsy of 3 sites in the right breast. No
apparent complications.

## 2017-10-13 ENCOUNTER — Other Ambulatory Visit: Payer: Self-pay | Admitting: Pathology

## 2017-10-16 ENCOUNTER — Encounter: Payer: Self-pay | Admitting: *Deleted

## 2017-10-16 ENCOUNTER — Other Ambulatory Visit: Payer: Self-pay | Admitting: Pathology

## 2017-10-16 LAB — SURGICAL PATHOLOGY

## 2017-10-16 NOTE — Progress Notes (Signed)
  Oncology Nurse Navigator Documentation  Navigator Location: [facility] (10/16/17 1600) Referral date to RadOnc/MedOnc: 10/19/17 (10/16/17 1600) )Navigator Encounter Type: Introductory phone call (10/16/17 1600)   Abnormal Finding Date: 10/05/17 (10/16/17 1600) Confirmed Diagnosis Date: 10/16/17 (10/16/17 1600)                   Barriers/Navigation Needs: Education;Coordination of Care (10/16/17 1600) Education: Newly Diagnosed Cancer Education (10/16/17 1600) Interventions: Coordination of Care (10/16/17 1600)   Coordination of Care: Appts (10/16/17 1600)                  Time Spent with Patient: 60 (10/16/17 1600)   Called patient to introduce navigation services.  She is newly diagnosed with right breast cancer.  I have scheduled her to see Dr. Tasia Catchings on Thursday, October 19, 2017 at 9:00 and Dr. Bary Castilla at 4:00.  She is aware I will give her educational literature at that time.  She is to call if she has any questions or needs.

## 2017-10-17 ENCOUNTER — Encounter: Payer: Self-pay | Admitting: *Deleted

## 2017-10-19 ENCOUNTER — Encounter: Payer: Self-pay | Admitting: General Surgery

## 2017-10-19 ENCOUNTER — Inpatient Hospital Stay: Payer: BLUE CROSS/BLUE SHIELD

## 2017-10-19 ENCOUNTER — Encounter: Payer: Self-pay | Admitting: Oncology

## 2017-10-19 ENCOUNTER — Encounter: Payer: Self-pay | Admitting: *Deleted

## 2017-10-19 ENCOUNTER — Inpatient Hospital Stay: Payer: BLUE CROSS/BLUE SHIELD | Attending: Oncology | Admitting: Oncology

## 2017-10-19 ENCOUNTER — Ambulatory Visit (INDEPENDENT_AMBULATORY_CARE_PROVIDER_SITE_OTHER): Payer: BLUE CROSS/BLUE SHIELD | Admitting: General Surgery

## 2017-10-19 ENCOUNTER — Other Ambulatory Visit: Payer: Self-pay

## 2017-10-19 VITALS — BP 102/69 | HR 85 | Temp 98.1°F | Ht 67.0 in | Wt 157.7 lb

## 2017-10-19 VITALS — BP 122/70 | HR 78 | Resp 14 | Ht 67.0 in | Wt 157.2 lb

## 2017-10-19 DIAGNOSIS — C50811 Malignant neoplasm of overlapping sites of right female breast: Secondary | ICD-10-CM | POA: Insufficient documentation

## 2017-10-19 DIAGNOSIS — Z85820 Personal history of malignant melanoma of skin: Secondary | ICD-10-CM | POA: Insufficient documentation

## 2017-10-19 DIAGNOSIS — R011 Cardiac murmur, unspecified: Secondary | ICD-10-CM

## 2017-10-19 DIAGNOSIS — Z808 Family history of malignant neoplasm of other organs or systems: Secondary | ICD-10-CM

## 2017-10-19 DIAGNOSIS — R51 Headache: Secondary | ICD-10-CM | POA: Diagnosis not present

## 2017-10-19 DIAGNOSIS — Z793 Long term (current) use of hormonal contraceptives: Secondary | ICD-10-CM | POA: Diagnosis not present

## 2017-10-19 DIAGNOSIS — F329 Major depressive disorder, single episode, unspecified: Secondary | ICD-10-CM | POA: Diagnosis not present

## 2017-10-19 DIAGNOSIS — Z801 Family history of malignant neoplasm of trachea, bronchus and lung: Secondary | ICD-10-CM | POA: Diagnosis not present

## 2017-10-19 DIAGNOSIS — K219 Gastro-esophageal reflux disease without esophagitis: Secondary | ICD-10-CM | POA: Diagnosis not present

## 2017-10-19 DIAGNOSIS — Z170 Estrogen receptor positive status [ER+]: Principal | ICD-10-CM

## 2017-10-19 DIAGNOSIS — Z720 Tobacco use: Secondary | ICD-10-CM

## 2017-10-19 DIAGNOSIS — Z3202 Encounter for pregnancy test, result negative: Secondary | ICD-10-CM | POA: Insufficient documentation

## 2017-10-19 DIAGNOSIS — Z79899 Other long term (current) drug therapy: Secondary | ICD-10-CM

## 2017-10-19 DIAGNOSIS — Z8052 Family history of malignant neoplasm of bladder: Secondary | ICD-10-CM | POA: Insufficient documentation

## 2017-10-19 DIAGNOSIS — Z5111 Encounter for antineoplastic chemotherapy: Secondary | ICD-10-CM | POA: Insufficient documentation

## 2017-10-19 DIAGNOSIS — F17210 Nicotine dependence, cigarettes, uncomplicated: Secondary | ICD-10-CM

## 2017-10-19 LAB — CBC WITH DIFFERENTIAL/PLATELET
Basophils Absolute: 0.1 10*3/uL (ref 0–0.1)
Basophils Relative: 1 %
Eosinophils Absolute: 0 10*3/uL (ref 0–0.7)
Eosinophils Relative: 0 %
HCT: 41.9 % (ref 35.0–47.0)
Hemoglobin: 14.4 g/dL (ref 12.0–16.0)
Lymphocytes Relative: 18 %
Lymphs Abs: 2.7 10*3/uL (ref 1.0–3.6)
MCH: 29.7 pg (ref 26.0–34.0)
MCHC: 34.3 g/dL (ref 32.0–36.0)
MCV: 86.6 fL (ref 80.0–100.0)
Monocytes Absolute: 0.9 10*3/uL (ref 0.2–0.9)
Monocytes Relative: 6 %
Neutro Abs: 11.5 10*3/uL — ABNORMAL HIGH (ref 1.4–6.5)
Neutrophils Relative %: 75 %
Platelets: 303 10*3/uL (ref 150–440)
RBC: 4.84 MIL/uL (ref 3.80–5.20)
RDW: 13 % (ref 11.5–14.5)
WBC: 15.1 10*3/uL — ABNORMAL HIGH (ref 3.6–11.0)

## 2017-10-19 LAB — COMPREHENSIVE METABOLIC PANEL
ALT: 11 U/L — ABNORMAL LOW (ref 14–54)
AST: 19 U/L (ref 15–41)
Albumin: 5 g/dL (ref 3.5–5.0)
Alkaline Phosphatase: 81 U/L (ref 38–126)
Anion gap: 10 (ref 5–15)
BUN: 12 mg/dL (ref 6–20)
CO2: 24 mmol/L (ref 22–32)
Calcium: 9.2 mg/dL (ref 8.9–10.3)
Chloride: 104 mmol/L (ref 101–111)
Creatinine, Ser: 0.69 mg/dL (ref 0.44–1.00)
GFR calc Af Amer: >60 mL/min (ref >60)
GFR calc non Af Amer: >60 mL/min (ref >60)
Glucose, Bld: 98 mg/dL (ref 65–99)
Potassium: 3.4 mmol/L — ABNORMAL LOW (ref 3.5–5.1)
Sodium: 138 mmol/L (ref 135–145)
Total Bilirubin: 0.5 mg/dL (ref 0.3–1.2)
Total Protein: 8.6 g/dL — ABNORMAL HIGH (ref 6.5–8.1)

## 2017-10-19 NOTE — Progress Notes (Signed)
Patient ID: Theresa Chaney, female   DOB: 05/10/1981, 36 y.o.   MRN: [id number]  Chief Complaint  Patient presents with  . Other    HPI Theresa Chaney is a 36 y.o. female who presents for a breast evaluation and discussion of her biopsy results. The most recent mammogram was done on 10/05/2017 and added views and right breast biopsy on 10/11/2017. Patient does perform regular self breast checks and gets regular mammograms done. She noticed a lump in her right breast about 2 months ago with a burning sensation at times. She is here today with her husband, RD.   HPI  Past Medical History:  Diagnosis Date  . Depression   . GERD (gastroesophageal reflux disease)     Past Surgical History:  Procedure Laterality Date  . BREAST BIOPSY Right 10/11/2017   12:00 posterior coil clip  . BREAST BIOPSY Right 10/11/2017   11:30 middle depth ribbon clip  . BREAST BIOPSY Right 10/11/2017   5:30 anterior depth x shape    Family History  Problem Relation Age of Onset  . Melanoma Maternal Aunt   . Melanoma Mother 70  . Diabetes Father   . Hypertension Father   . Hyperlipidemia Father   . Bladder Cancer Maternal Grandmother   . Lung cancer Maternal Aunt     Social History Social History   Tobacco Use  . Smoking status: Current Every Day Smoker    Packs/day: 1.00    Years: 5.00    Pack years: 5.00    Types: Cigarettes  . Smokeless tobacco: Never Used  Substance Use Topics  . Alcohol use: No    Frequency: Never  . Drug use: No    No Known Allergies  Current Outpatient Medications  Medication Sig Dispense Refill  . ALPRAZolam (XANAX) 0.25 MG tablet Take 0.25 mg at bedtime as needed by mouth.    . escitalopram (LEXAPRO) 20 MG tablet Take 20 mg daily by mouth.    . esomeprazole (NEXIUM) 40 MG capsule Take 40 mg daily by mouth.    . medroxyPROGESTERone (DEPO-PROVERA) 150 MG/ML injection medroxyprogesterone 150 mg/mL intramuscular suspension    . zolpidem (AMBIEN) 5 MG tablet Take 5 mg  at bedtime by mouth.     No current facility-administered medications for this visit.     Review of Systems Review of Systems  Constitutional: Negative.   Respiratory: Negative.   Cardiovascular: Negative.     Blood pressure 122/70, pulse 78, resp. rate 14, height 5' 7" (1.702 m), weight 157 lb 3.2 oz (71.3 kg).  Physical Exam Physical Exam  Constitutional: She is oriented to person, place, and time. She appears well-developed and well-nourished.  Eyes: Conjunctivae are normal. No scleral icterus.  Neck: Neck supple.  Cardiovascular: Normal rate, regular rhythm and normal heart sounds.  Pulmonary/Chest: Effort normal. Right breast exhibits mass. Right breast exhibits no inverted nipple, no nipple discharge, no skin change and no tenderness. Left breast exhibits no inverted nipple, no mass, no nipple discharge, no skin change and no tenderness.    Post biopsy changes noted in the 12o'clk position   Lymphadenopathy:    She has no cervical adenopathy.    She has no axillary adenopathy.  Neurological: She is alert and oriented to person, place, and time.  Skin: Skin is warm and dry.  Psychiatric: She has a normal mood and affect.    Data Reviewed Laboratory studies dated 10/19/2017 were notable for hemoglobin of 14.4 with an MCV of 86, white blood cell count   of 15,100, 75% polys, 18% lymphocytes, platelet count 303,000. Comprehensive metabolic panel notable for potassium of 3.4. Creatinine 0.69. Normal liver function studies.  CA 2729: Pending.  10/11/2017 right breast biopsy 3:  DIAGNOSIS:  A. RIGHT BREAST, 11:30, MIDDLE DEPTH; STEREOTACTIC BIOPSY:  - CARCINOMA IN SITU, INTERMEDIATE TO HIGH GRADE WITH NECROSIS AND  CALCIFICATIONS.   Note: Definitive classification is deferred to an excision specimen as  some areas show features of DCIS while other areas display features  ALH/LCIS. In situ carcinoma involves 6 of 7 tissue blocks and spans 7 mm  in greatest linear  dimension on a single core.   B. RIGHT BREAST, 5:30, SUBAREOLAR; STEREOTACTIC BIOPSY:  - INVASIVE CARCINOMA WITH DIFFUSELY INFILTRATIVE PATTERN AND  LYMPHOVASCULAR INVASION, IN A BACKGROUND OF INTERMEDIATE GRADE CARCINOMA  IN SITU WITH NECROSIS AND CALCIFICATIONS.   Size of invasive carcinoma: at least 1.0 cm in this sample  Histologic grade of invasive carcinoma: Grade 2    Glandular/tubular differentiation score: 3    Nuclear pleomorphism score: 2    Mitotic rate score: 1    Note: Invasive carcinoma shows some lobular cytologic features.  Definitive classification is deferred to an excision specimen. Invasive  carcinoma is present in 8 of 8 tissue blocks.   C. RIGHT BREAST, 12:30, POSTERIOR; STEREOTACTIC BIOPSY:  - INVASIVE MAMMARY CARCINOMA OF NO SPECIAL TYPE, ASSOCIATED WITH HIGH  GRADE DUCTAL CARCINOMA IN SITU WITH NECROSIS AND CALCIFICATIONS.   BREAST BIOMARKER TESTS - RIGHT BREAST 5:30 SUBAREOLAR  Estrogen Receptor (ER) Status: POSITIVE, 75% nuclear staining    Average intensity of staining: Moderate to strong  Progesterone Receptor (PgR) Status: NEGATIVE  HER2 (by immunohistochemistry): NEGATIVE, Score 0  Percentage of cells with uniform intense complete membrane staining: 0%   Diagnostic mammogram and ultrasound dated 10/05/2017 reviewed. Tumor masses up to 2.6 cm in diameter. BIRAD-5.  Assessment    Multifocal right breast cancer.    Plan    The patient's postbiopsy images were reviewed with both she and her husband. The diffuse area involvement encompassing probably 60% of the breast parenchyma makes the likelihood of breast conservation unlikely.  With her tumor diameter greater than 2 cm, she is certainly a candidate for neoadjuvant chemotherapy. With the ER-positive status she is unlikely to achieve a complete pathologic response.  Pros and cons of neoadjuvant versus adjuvant chemotherapy were reviewed.  Opportunity for mastectomy with or  without immediate reconstruction were discussed. This decision can be postponed at this time.  The risks associated with central venous access including arterial, pulmonary and venous injury were reviewed. The possible need for additional treatment if pulmonary injury occurs (chest tube placement) was discussed.  We spent the better part of an hour reviewing her present status and options for management.    HPI, Physical Exam, Assessment and Plan have been scribed under the direction and in the presence of Jeffrey W. Byrnett, MD  Caryl-Lyn Kennedy, LPN  I have completed the exam and reviewed the above documentation for accuracy and completeness.  I agree with the above.  [employer] has been used and any errors in dictation or transcription are unintentional.  Jeffrey Byrnett, M.D., F.A.C.S.  Byrnett, Jeffrey W 10/19/2017, 7:45 PM  Patient's surgery has been scheduled for 10-24-17 at [facility].   Michele J. Bailey, CMA    

## 2017-10-19 NOTE — Progress Notes (Signed)
Patient here today as a new patient with breast cancer

## 2017-10-19 NOTE — Progress Notes (Signed)
Hematology/Oncology Consult note [facility] Telephone:([phone] [phone]  Patient Care Team: Juluis Pitch, MD as PCP - General (Family Medicine)   Name of the patient: Theresa Chaney  [id number]  [birth year]   Date of visit: 10/19/17 REASON FOR [place]:  Newly diagnosed breast cancer  History of presenting illness 35 year old female with past medical history listed as below presented for evaluation of newly diagnosed right breast cancer. Patient self palpated a right breast mass 2 months ago during bathing. This triggered workup including mammogram and ultrasound. 10/05/2017 mammogram diagnostic breast tomo bilaterally showed multicentric area had a suspicion of breast cancer. 1  highly suspicious irregular mass with architectural distortion in the upper right breast at posterior depth measuring approximately 2 cm greatest dimension with associated architectural distortion and pleomorphic micro-calcifications. On ultrasound of vague hypoechoic masslike areas seen in the right breast at 11:30 o'clock axis. 2 second areas irregular hypoechoic mass in the right breast at the 11:30 o'clock axis, 6 cm from the nipple, at the superficial depth measuring 1.2 cm, likely correlate for a highly suspicious 1.4 cm extent of pleomorphic and a fine linear branching calcifications. 3 additional highly suspicious pleomorphic calcifications within the upper retroareolar right breast extending to this subareolar right breast, measuring 3.7 cm extending.  Right axilla was evaluated with ultrasound showing no enlarged or morphologically abnormal lymph nodes.  Patient underwent stereotactic biopsy, on 10/11/2017. Pathology showed A, carcinoma in situ intermediate to high grade with necrosis and calcifications at the right breast 11:30. B, right breast 5:30 subareolar, invasive carcinoma with diffusely infiltrative pattern in the lymphovascular invasion. In a background  of intermediate grade carcinoma in situ with necrosis and calcifications. C right breast 12:30, posterior common invasive mammary carcinoma of her type, associated with high-grade DCIS with necrosis and calcifications. Right breast 5:30 subareolar, ER +75%, PR negative HER-2 negative. Right breast 12:30 posterior, and ER +90%, and PR negative, and HER-2 negative.  Today patient was accompanied by her husband to the clinic to discuss about the biology results and treatment plans. She denies any back pain, headache, chest pain, shortness of breath, abdominal pain. She reports previously healthy other than taking some mood disorder medication.  She is on depo shot for contraceptive measures. Her aunt has a history of melanoma. . She is married, and she has a Psychiatrist and a biological daughter who is 84 years old. . She reports that she has a history of heart murmur which was diagnosed by primary care physician. She's never had any other health problems.    Review of Systems  Constitutional: Negative for weight loss.  HENT: Negative for hearing loss and tinnitus.   Eyes: Negative for blurred vision.  Respiratory: Negative for cough.   Cardiovascular: Negative for chest pain.  Gastrointestinal: Negative for heartburn.  Genitourinary: Negative for dysuria.  Musculoskeletal: Negative for myalgias.  Neurological: Negative for dizziness.  Endo/Heme/Allergies: Does not bruise/bleed easily.  Psychiatric/Behavioral: The patient is nervous/anxious.     No Known Allergies  There are no active problems to display for this patient.    Past Medical History:  Diagnosis Date  . Depression   . GERD (gastroesophageal reflux disease)      Past Surgical History:  Procedure Laterality Date  . BREAST BIOPSY Right 10/11/2017   12:00 posterior coil clip  . BREAST BIOPSY Right 10/11/2017   11:30 middle depth ribbon clip  . BREAST BIOPSY Right 10/11/2017   5:30 anterior depth x shape    Social  History  Socioeconomic History  . Marital status: Married    Spouse name: Not on file  . Number of children: Not on file  . Years of education: Not on file  . Highest education level: Not on file  Social Needs  . Financial resource strain: Not on file  . Food insecurity - worry: Not on file  . Food insecurity - inability: Not on file  . Transportation needs - medical: Not on file  . Transportation needs - non-medical: Not on file  Occupational History  . Occupation: Occupational psychologist    Comment: Therapist, art   Tobacco Use  . Smoking status: Current Every Day Smoker    Packs/day: 1.00    Years: 5.00    Pack years: 5.00    Types: Cigarettes  . Smokeless tobacco: Never Used  Substance and Sexual Activity  . Alcohol use: No    Frequency: Never  . Drug use: No  . Sexual activity: Yes    Birth control/protection: Injection  Other Topics Concern  . Not on file  Social History Narrative  . Not on file     Family History  Problem Relation Age of Onset  . Melanoma Maternal Aunt   . Melanoma Mother   . Diabetes Father   . Hypertension Father   . Hyperlipidemia Father   . Bladder Cancer Maternal Grandmother   . Lung cancer Maternal Aunt      Current Outpatient Medications:  .  ALPRAZolam (XANAX) 0.25 MG tablet, Take 0.25 mg at bedtime as needed by mouth., Disp: , Rfl:  .  escitalopram (LEXAPRO) 20 MG tablet, Take 20 mg daily by mouth., Disp: , Rfl:  .  esomeprazole (NEXIUM) 40 MG capsule, Take 40 mg daily by mouth., Disp: , Rfl:  .  medroxyPROGESTERone (DEPO-PROVERA) 150 MG/ML injection, medroxyprogesterone 150 mg/mL intramuscular suspension, Disp: , Rfl:  .  zolpidem (AMBIEN) 5 MG tablet, Take 5 mg at bedtime by mouth., Disp: , Rfl:    Physical exam:  Vitals:   10/19/17 0858  BP: 102/69  Pulse: 85  Temp: 98.1 F (36.7 C)  TempSrc: Tympanic  Weight: 157 lb 11.8 oz (71.6 kg)  Height: 5' 7" (1.702 m)   GENERAL:Alert, no distress and  comfortable.  EYES: no pallor or icterus OROPHARYNX: no thrush or ulceration; good dentition  NECK: supple, no masses felt LYMPH:  no palpable lymphadenopathy in the cervical, axillary or inguinal regions LUNGS: clear to auscultation and  No wheeze or crackles HEART/CVS: regular rate & rhythm and no murmurs; No lower extremity edema ABDOMEN: abdomen soft, non-tender and normal bowel sounds Musculoskeletal:no cyanosis of digits and no clubbing  PSYCH: alert & oriented x 3  NEURO: no focal motor/sensory deficits SKIN:  no rashes or significant lesions Breasts: palpable right breasts mass at 11:30, no skin or nipple changes or axillary nodes. Left breast no palpable mass or lymph node.     No flowsheet data found. No flowsheet data found.  No images are attached to the encounter.  US Breast Ltd Uni Right Inc Axilla  Addendum Date: 10/05/2017   ADDENDUM REPORT: 10/05/2017 14:07 ADDENDUM: Right axilla was evaluated with ultrasound showing no enlarged or morphologically abnormal lymph nodes. Electronically Signed   By: Franki Cabot M.D.   On: 10/05/2017 14:07   Result Date: 10/05/2017 CLINICAL DATA:  36 year old female with palpable right breast mass. EXAM: 2D DIGITAL DIAGNOSTIC BILATERAL MAMMOGRAM WITH CAD AND ADJUNCT TOMO ULTRASOUND RIGHT BREAST COMPARISON:  None. ACR Breast Density Category  c: The breast tissue is heterogeneously dense, which may obscure small masses. FINDINGS: Bilateral 2D CC and MLO views were obtained today, with additional 3D tomosynthesis, and with additional spot compression view of the upper right breast corresponding to the area of clinical concern. There is an irregular mass with architectural distortion within the upper right breast, at posterior depth, measuring approximately 2 cm greatest dimension, with associated architectural distortion and pleomorphic microcalcifications. Additional grouped pleomorphic and fine linear branching calcifications are identified  within the upper right breast, 12 o'clock axis region, measuring 1.4 cm extent. Additional suspicious pleomorphic calcifications are identified within the upper retroareolar right breast extending to the subareolar right breast, spanning 3.7 cm as measured on the true lateral view. Mammographic images were processed with CAD. On physical exam, firm palpable mass is identified within the upper right breast. Targeted ultrasound is performed, showing an irregular hypoechoic mass in the right breast at the 11:30 o'clock axis, 6 cm from the nipple, measuring 1.2 x 1.1 x 0.7 cm, with associated microcalcifications, a likely correlate for the 1.4 cm extent of pleomorphic and fine linear calcifications seen in the upper right breast on mammogram. Additional vague hypoechoic masslike area is identified within the right breast at the 11:30 o'clock axis, 5 cm from the nipple, at posterior depth, measuring 2.6 x 2.4 x 1.5 cm, a likely correlate for the dominant mass seen on mammogram, better seen by mammogram. IMPRESSION: 1. Highly suspicious irregular mass with architectural distortion in the upper RIGHT breast, at POSTERIOR depth, measuring approximately 2 cm greatest dimension with associated architectural distortion and pleomorphic microcalcifications. On ultrasound, a vague hypoechoic masslike area is seen in the right breast at the 11:30 o'clock axis, 5 cm from the nipple, measuring 2.6 cm, a likely correlate for the dominant mass seen on mammogram, but better seen on mammogram. As such, stereotactic biopsy with 3D tomosynthesis guidance is recommended for this dominant mass. 2. Irregular hypoechoic mass in the RIGHT breast at the 11:30 o'clock axis, 6 cm from the nipple, at SUPERFICIAL depth, measuring 1.2 cm, a likely correlate for the highly suspicious 1.4 cm extent of pleomorphic and fine linear branching calcifications identified in the upper right breast on mammogram. Recommend ultrasound-guided biopsy to define  upper extent of disease. This could be performed as an additional stereotactic biopsy at the discretion of the performing radiologist. 3. Additional highly suspicious pleomorphic calcifications within the upper retroareolar right breast extending to the SUBAREOLAR right breast, measuring 3.7 cm extent. Recommend a third same day biopsy of the most anterior calcifications, using stereotactic guidance, to define anterior extent of disease. RECOMMENDATION: Three right breast biopsies, as detailed above. Ordering physician will be contacted with today's results and patient will then be scheduled for biopsies at her earliest convenience. I have discussed the findings and recommendations with the patient. Results were also provided in writing at the conclusion of the visit. If applicable, a reminder letter will be sent to the patient regarding the next appointment. BI-RADS CATEGORY  5: Highly suggestive of malignancy. Electronically Signed: By: Franki Cabot M.D. On: 10/05/2017 11:32   Mm Diag Breast Tomo Bilateral  Addendum Date: 10/05/2017   ADDENDUM REPORT: 10/05/2017 14:07 ADDENDUM: Right axilla was evaluated with ultrasound showing no enlarged or morphologically abnormal lymph nodes. Electronically Signed   By: Franki Cabot M.D.   On: 10/05/2017 14:07   Result Date: 10/05/2017 CLINICAL DATA:  36 year old female with palpable right breast mass. EXAM: 2D DIGITAL DIAGNOSTIC BILATERAL MAMMOGRAM WITH CAD AND ADJUNCT  TOMO ULTRASOUND RIGHT BREAST COMPARISON:  None. ACR Breast Density Category c: The breast tissue is heterogeneously dense, which may obscure small masses. FINDINGS: Bilateral 2D CC and MLO views were obtained today, with additional 3D tomosynthesis, and with additional spot compression view of the upper right breast corresponding to the area of clinical concern. There is an irregular mass with architectural distortion within the upper right breast, at posterior depth, measuring approximately 2 cm  greatest dimension, with associated architectural distortion and pleomorphic microcalcifications. Additional grouped pleomorphic and fine linear branching calcifications are identified within the upper right breast, 12 o'clock axis region, measuring 1.4 cm extent. Additional suspicious pleomorphic calcifications are identified within the upper retroareolar right breast extending to the subareolar right breast, spanning 3.7 cm as measured on the true lateral view. Mammographic images were processed with CAD. On physical exam, firm palpable mass is identified within the upper right breast. Targeted ultrasound is performed, showing an irregular hypoechoic mass in the right breast at the 11:30 o'clock axis, 6 cm from the nipple, measuring 1.2 x 1.1 x 0.7 cm, with associated microcalcifications, a likely correlate for the 1.4 cm extent of pleomorphic and fine linear calcifications seen in the upper right breast on mammogram. Additional vague hypoechoic masslike area is identified within the right breast at the 11:30 o'clock axis, 5 cm from the nipple, at posterior depth, measuring 2.6 x 2.4 x 1.5 cm, a likely correlate for the dominant mass seen on mammogram, better seen by mammogram. IMPRESSION: 1. Highly suspicious irregular mass with architectural distortion in the upper RIGHT breast, at POSTERIOR depth, measuring approximately 2 cm greatest dimension with associated architectural distortion and pleomorphic microcalcifications. On ultrasound, a vague hypoechoic masslike area is seen in the right breast at the 11:30 o'clock axis, 5 cm from the nipple, measuring 2.6 cm, a likely correlate for the dominant mass seen on mammogram, but better seen on mammogram. As such, stereotactic biopsy with 3D tomosynthesis guidance is recommended for this dominant mass. 2. Irregular hypoechoic mass in the RIGHT breast at the 11:30 o'clock axis, 6 cm from the nipple, at SUPERFICIAL depth, measuring 1.2 cm, a likely correlate for the  highly suspicious 1.4 cm extent of pleomorphic and fine linear branching calcifications identified in the upper right breast on mammogram. Recommend ultrasound-guided biopsy to define upper extent of disease. This could be performed as an additional stereotactic biopsy at the discretion of the performing radiologist. 3. Additional highly suspicious pleomorphic calcifications within the upper retroareolar right breast extending to the SUBAREOLAR right breast, measuring 3.7 cm extent. Recommend a third same day biopsy of the most anterior calcifications, using stereotactic guidance, to define anterior extent of disease. RECOMMENDATION: Three right breast biopsies, as detailed above. Ordering physician will be contacted with today's results and patient will then be scheduled for biopsies at her earliest convenience. I have discussed the findings and recommendations with the patient. Results were also provided in writing at the conclusion of the visit. If applicable, a reminder letter will be sent to the patient regarding the next appointment. BI-RADS CATEGORY  5: Highly suggestive of malignancy. Electronically Signed: By: Franki Cabot M.D. On: 10/05/2017 11:32   Mm Clip Placement Right  Result Date: 10/11/2017 CLINICAL DATA:  Post stereotactic core needle biopsy of 3 sites in the right breast. EXAM: DIAGNOSTIC RIGHT MAMMOGRAM POST STEREOTACTIC BIOPSY COMPARISON:  Previous exam(s). FINDINGS: Mammographic images were obtained following stereotactic guided biopsy of right breast. Two-view mammography demonstrates presence of 3 tissue markers is expected: 1. Coil  shaped marker at site 1, 12:30 o'clock. 2. Ribbon shaped marker has site 2, 11:30 o'clock. 3. X shaped marker, at site 3, 5:30 o'clock IMPRESSION: Successful placement of tissue markers post 3 stereotactic core needle biopsies of the right breast. Final Assessment: Post Procedure Mammograms for Marker Placement Electronically Signed   By: Fidela Salisbury  M.D.   On: 10/11/2017 09:57   Mm Rt Breast Bx W Loc Dev 1st Lesion Image Bx Spec Stereo Guide  Addendum Date: 10/13/2017   ADDENDUM REPORT: 10/13/2017 12:36 ADDENDUM: The pathology revealed DCIS at the right breast 11:30 o'clock, invasive ductal carcinoma at the right breast 5:30 o'clock, invasive mammary carcinoma at the right breast 12:30 o'clock. These are foung to be concordant with imaging findings. I discussed the results over the phone with the patient and answered her questions. The patient states she is doing well post biopsy without complications. The patient will be referred to the nurse navigator for scheduling of an appointment for surgical consultation. Electronically Signed   By: Abelardo Diesel M.D.   On: 10/13/2017 12:36   Result Date: 10/13/2017 CLINICAL DATA:  Right breast palpable mass and suspicious microcalcifications. EXAM: RIGHT BREAST STEREOTACTIC CORE NEEDLE BIOPSY COMPARISON:  Previous exams. FINDINGS: The patient and I discussed the procedure of stereotactic-guided biopsy including benefits and alternatives. We discussed the high likelihood of a successful procedure. We discussed the risks of the procedure including infection, bleeding, tissue injury, clip migration, and inadequate sampling. Informed written consent was given. The usual time out protocol was performed immediately prior to the procedure. Using sterile technique and 1% Lidocaine as local anesthetic, under stereotactic guidance, a 9 gauge vacuum assisted device was used to perform core needle biopsy of mass/calcifications in the right 12:30 o'clock breast using a superior approach. Specimen radiograph was performed showing the presence of calcifications. Specimens with calcifications are identified for pathology. Lesion quadrant: Upper inner quadrant. At the conclusion of the procedure, a coil shaped tissue marker clip was deployed into the biopsy cavity. Next, using sterile technique and 1% Lidocaine as local  anesthetic, under stereotactic guidance, a 9 gauge vacuum assisted device was used to perform core needle biopsy of calcifications in the right 11:30 o'clock breast using a superior approach. Specimen radiograph was performed showing the presence of calcifications. Specimens with calcifications are identified for pathology. Lesion quadrant: Upper outer quadrant. At the conclusion of the procedure, a ribbon shaped tissue marker clip was deployed into the biopsy cavity. Next, using sterile technique and 1% Lidocaine as local anesthetic, under stereotactic guidance, a 9 gauge vacuum assisted device was used to perform core needle biopsy of calcifications in the right 5:30 o'clock breast using a superior approach. Specimen radiograph was performed showing the presence of calcifications. Specimens with calcifications are identified for pathology. Lesion quadrant: Lower inner quadrant At the conclusion of the procedure, a X shaped tissue marker clip was deployed into the biopsy cavity. Follow-up 2-view mammogram was performed and dictated separately. IMPRESSION: Stereotactic-guided biopsy of 3 sites in the right breast. No apparent complications. Electronically Signed: By: Fidela Salisbury M.D. 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 These are foung to be concordant with imaging findings. I discussed the results over  the phone with the patient and answered her questions. The patient states she is doing well post biopsy without complications. The patient will be referred to the nurse navigator for scheduling of an appointment for surgical consultation. Electronically Signed   By: Abelardo Diesel M.D.   On: 10/13/2017  12:36   Result Date: 10/13/2017 CLINICAL DATA:  Right breast palpable mass and suspicious microcalcifications. EXAM: RIGHT BREAST STEREOTACTIC CORE NEEDLE BIOPSY COMPARISON:  Previous exams. FINDINGS: The patient and I discussed the procedure of stereotactic-guided biopsy including benefits and alternatives. We discussed the high likelihood of a successful procedure. We discussed the risks of the procedure including infection, bleeding, tissue injury, clip migration, and inadequate sampling. Informed written consent was given. The usual time out protocol was performed immediately prior to the procedure. Using sterile technique and 1% Lidocaine as local anesthetic, under stereotactic guidance, a 9 gauge vacuum assisted device was used to perform core needle biopsy of mass/calcifications in the right 12:30 o'clock breast using a superior approach. Specimen radiograph was performed showing the presence of calcifications. Specimens with calcifications are identified for pathology. Lesion quadrant: Upper inner quadrant. At the conclusion of the procedure, a coil shaped tissue marker clip was deployed into the biopsy cavity. Next, using sterile technique and 1% Lidocaine as local anesthetic, under stereotactic guidance, a 9 gauge vacuum assisted device was used to perform core needle biopsy of calcifications in the right 11:30 o'clock breast using a superior approach. Specimen radiograph was performed showing the presence of calcifications. Specimens with calcifications are identified for pathology. Lesion quadrant: Upper outer quadrant. At the conclusion of the procedure, a ribbon shaped tissue marker clip was deployed into the biopsy cavity. Next, using sterile technique and 1% Lidocaine as local anesthetic, under stereotactic guidance, a 9 gauge vacuum assisted device was used to perform core needle biopsy of calcifications in the right 5:30 o'clock breast using a superior approach. Specimen radiograph was performed  showing the presence of calcifications. Specimens with calcifications are identified for pathology. Lesion quadrant: Lower inner quadrant At the conclusion of the procedure, a X shaped tissue marker clip was deployed into the biopsy cavity. Follow-up 2-view mammogram was performed and dictated separately. IMPRESSION: Stereotactic-guided biopsy of 3 sites in the right breast. No apparent complications. Electronically Signed: By: Fidela Salisbury M.D. 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 These are foung to be concordant with imaging findings. I discussed the results over the phone with the patient and answered her questions. The patient states she is doing well post biopsy without complications. The patient will be referred to the nurse navigator for scheduling of an appointment for surgical consultation. Electronically Signed   By: Abelardo Diesel M.D.   On: 10/13/2017 12:36   Result Date: 10/13/2017 CLINICAL DATA:  Right breast palpable mass and suspicious microcalcifications. EXAM: RIGHT BREAST STEREOTACTIC CORE NEEDLE BIOPSY COMPARISON:  Previous exams. FINDINGS: The patient and I discussed the procedure of stereotactic-guided biopsy including benefits and alternatives. We discussed the high likelihood of a successful procedure. We discussed the risks of the procedure including infection, bleeding, tissue injury, clip migration, and inadequate sampling. Informed written consent was given. The usual time out protocol was performed immediately prior  to the procedure. Using sterile technique and 1% Lidocaine as local anesthetic, under stereotactic guidance, a 9 gauge vacuum assisted device was used to  perform core needle biopsy of mass/calcifications in the right 12:30 o'clock breast using a superior approach. Specimen radiograph was performed showing the presence of calcifications. Specimens with calcifications are identified for pathology. Lesion quadrant: Upper inner quadrant. At the conclusion of the procedure, a coil shaped tissue marker clip was deployed into the biopsy cavity. Next, using sterile technique and 1% Lidocaine as local anesthetic, under stereotactic guidance, a 9 gauge vacuum assisted device was used to perform core needle biopsy of calcifications in the right 11:30 o'clock breast using a superior approach. Specimen radiograph was performed showing the presence of calcifications. Specimens with calcifications are identified for pathology. Lesion quadrant: Upper outer quadrant. At the conclusion of the procedure, a ribbon shaped tissue marker clip was deployed into the biopsy cavity. Next, using sterile technique and 1% Lidocaine as local anesthetic, under stereotactic guidance, a 9 gauge vacuum assisted device was used to perform core needle biopsy of calcifications in the right 5:30 o'clock breast using a superior approach. Specimen radiograph was performed showing the presence of calcifications. Specimens with calcifications are identified for pathology. Lesion quadrant: Lower inner quadrant At the conclusion of the procedure, a X shaped tissue marker clip was deployed into the biopsy cavity. Follow-up 2-view mammogram was performed and dictated separately. IMPRESSION: Stereotactic-guided biopsy of 3 sites in the right breast. No apparent complications. Electronically Signed: By: Fidela Salisbury M.D. On: 10/11/2017 09:54    Assessment and plan- Patient is a 36 y.o. female presents for evaluation of newly diagnosed multicentric right breast cancer.  cT2 cN0 cM0.   1. Malignant neoplasm of overlapping sites of right breast in female, estrogen receptor positive ([place])   2. Estrogen  receptor positive status (ER+)   3. Tobacco abuse    #Discussed the with patient about image results and pathology results. This is a young patient, with multicentric breast cancer, high risk. I recommend new adjuvant chemotherapy followed by mastectomy and sentinel lymph node biopsy. We will present his case at tumor board next Monday. #Also refer her to genetic testing.  # Check CBC, CMP, Ca 15-7, CA 27-29. nd 2D echo. Patient will meet Dr. Tilden Fossa this afternoon. # Smoke cessation discussed.  All questions answered.  Follow up appointment will be scheduled after breast tumor board.  Thank you for this kind referral and the opportunity to participate in the care of this patient  Dr. Earlie Server, MD, PhD [facility] at [facility] Pager- [phone] 10/19/2017

## 2017-10-19 NOTE — Progress Notes (Signed)
  Oncology Nurse Navigator Documentation  Navigator Location: [facility]Med [facility] (10/19/17 1000) Referral date to RadOnc/MedOnc: 10/19/17 (10/19/17 1000) )Navigator Encounter Type: Initial MedOnc (10/19/17 1000)                         Barriers/Navigation Needs: Education (10/19/17 1000) Education: Newly Diagnosed Cancer Education (10/19/17 1000) Interventions: Education (10/19/17 1000)     Education Method: Written;Verbal (10/19/17 1000)                Time Spent with Patient: 32 (10/19/17 1000)   Met with patient and her husband today during her initial medical oncology consult with Dr. Tasia Catchings.  Patient with 3 separate right breast masses.  Patient voices desire for mastectomy.  Dr. Tasia Catchings discussed possible neoadjuvant chemotherapy.  She also discussed need for genetic testing based on young age at diagnosis.  Patient informed that Ofri, the genetic counselor will contact her.  Patient with positive attitude. States, "Donnald Garre been through a lot, and I will not let them get me down."  Offered support.  She is to call if she has any questions or needs.  Gave patient breast cancer educational literature, "My Breast Cancer Treatment Handbook" by Josephine Igo, RN.

## 2017-10-20 ENCOUNTER — Telehealth: Payer: Self-pay

## 2017-10-20 ENCOUNTER — Telehealth: Payer: Self-pay | Admitting: Genetic Counselor

## 2017-10-20 LAB — CANCER ANTIGEN 27.29: CAN 27.29: 14.6 U/mL (ref 0.0–38.6)

## 2017-10-20 LAB — CANCER ANTIGEN 15-3: CAN 15 3: 10.7 U/mL (ref 0.0–25.0)

## 2017-10-20 NOTE — Telephone Encounter (Signed)
Spoke with the patient and she will need to be seen by per admit testing 2 hours prior to her surgery. She will [facility] to [facility] and be seen by pre admit testing on 10/24/17 at 9:20 am. The patient is aware of date, time, and instructions.

## 2017-10-20 NOTE — Telephone Encounter (Signed)
Patient referred for genetic counseling by Dr. Tasia Catchings. Called and left voice message to contact me to schedule.  Steele Berg, [place], [place] Genetic Counselor Phone: [phone]

## 2017-10-24 ENCOUNTER — Ambulatory Visit: Payer: BLUE CROSS/BLUE SHIELD

## 2017-10-24 ENCOUNTER — Encounter: Payer: Self-pay | Admitting: *Deleted

## 2017-10-24 ENCOUNTER — Encounter
Admission: RE | Disposition: A | Discharge status: Home / Self Care | Payer: Self-pay | Source: Ambulatory Visit | Attending: General Surgery

## 2017-10-24 ENCOUNTER — Ambulatory Visit: Payer: BLUE CROSS/BLUE SHIELD | Admitting: Certified Registered Nurse Anesthetist

## 2017-10-24 ENCOUNTER — Encounter: Payer: Self-pay | Admitting: Anesthesiology

## 2017-10-24 ENCOUNTER — Ambulatory Visit
Admission: RE | Admit: 2017-10-24 | Discharge: 2017-10-24 | Disposition: A | Discharge status: Home / Self Care | Payer: BLUE CROSS/BLUE SHIELD | Source: Ambulatory Visit | Attending: General Surgery | Admitting: General Surgery

## 2017-10-24 DIAGNOSIS — Z79899 Other long term (current) drug therapy: Secondary | ICD-10-CM | POA: Diagnosis not present

## 2017-10-24 DIAGNOSIS — Z95828 Presence of other vascular implants and grafts: Secondary | ICD-10-CM

## 2017-10-24 DIAGNOSIS — F329 Major depressive disorder, single episode, unspecified: Secondary | ICD-10-CM | POA: Insufficient documentation

## 2017-10-24 DIAGNOSIS — C50811 Malignant neoplasm of overlapping sites of right female breast: Secondary | ICD-10-CM

## 2017-10-24 DIAGNOSIS — Z170 Estrogen receptor positive status [ER+]: Secondary | ICD-10-CM | POA: Diagnosis not present

## 2017-10-24 DIAGNOSIS — K219 Gastro-esophageal reflux disease without esophagitis: Secondary | ICD-10-CM | POA: Diagnosis not present

## 2017-10-24 DIAGNOSIS — F17210 Nicotine dependence, cigarettes, uncomplicated: Secondary | ICD-10-CM | POA: Insufficient documentation

## 2017-10-24 DIAGNOSIS — C50411 Malignant neoplasm of upper-outer quadrant of right female breast: Secondary | ICD-10-CM | POA: Diagnosis not present

## 2017-10-24 DIAGNOSIS — C50911 Malignant neoplasm of unspecified site of right female breast: Secondary | ICD-10-CM | POA: Insufficient documentation

## 2017-10-24 HISTORY — PX: PORTACATH PLACEMENT: SHX2246

## 2017-10-24 LAB — POCT PREGNANCY, URINE: Preg Test, Ur: NEGATIVE

## 2017-10-24 IMAGING — DX DG CHEST 1V PORT
1 series · 1 of 1 positions shown · non-contrast
Comparison: [DATE]

CLINICAL DATA: Status post port placement

EXAM:
PORTABLE CHEST 1 VIEW

[chest ap]
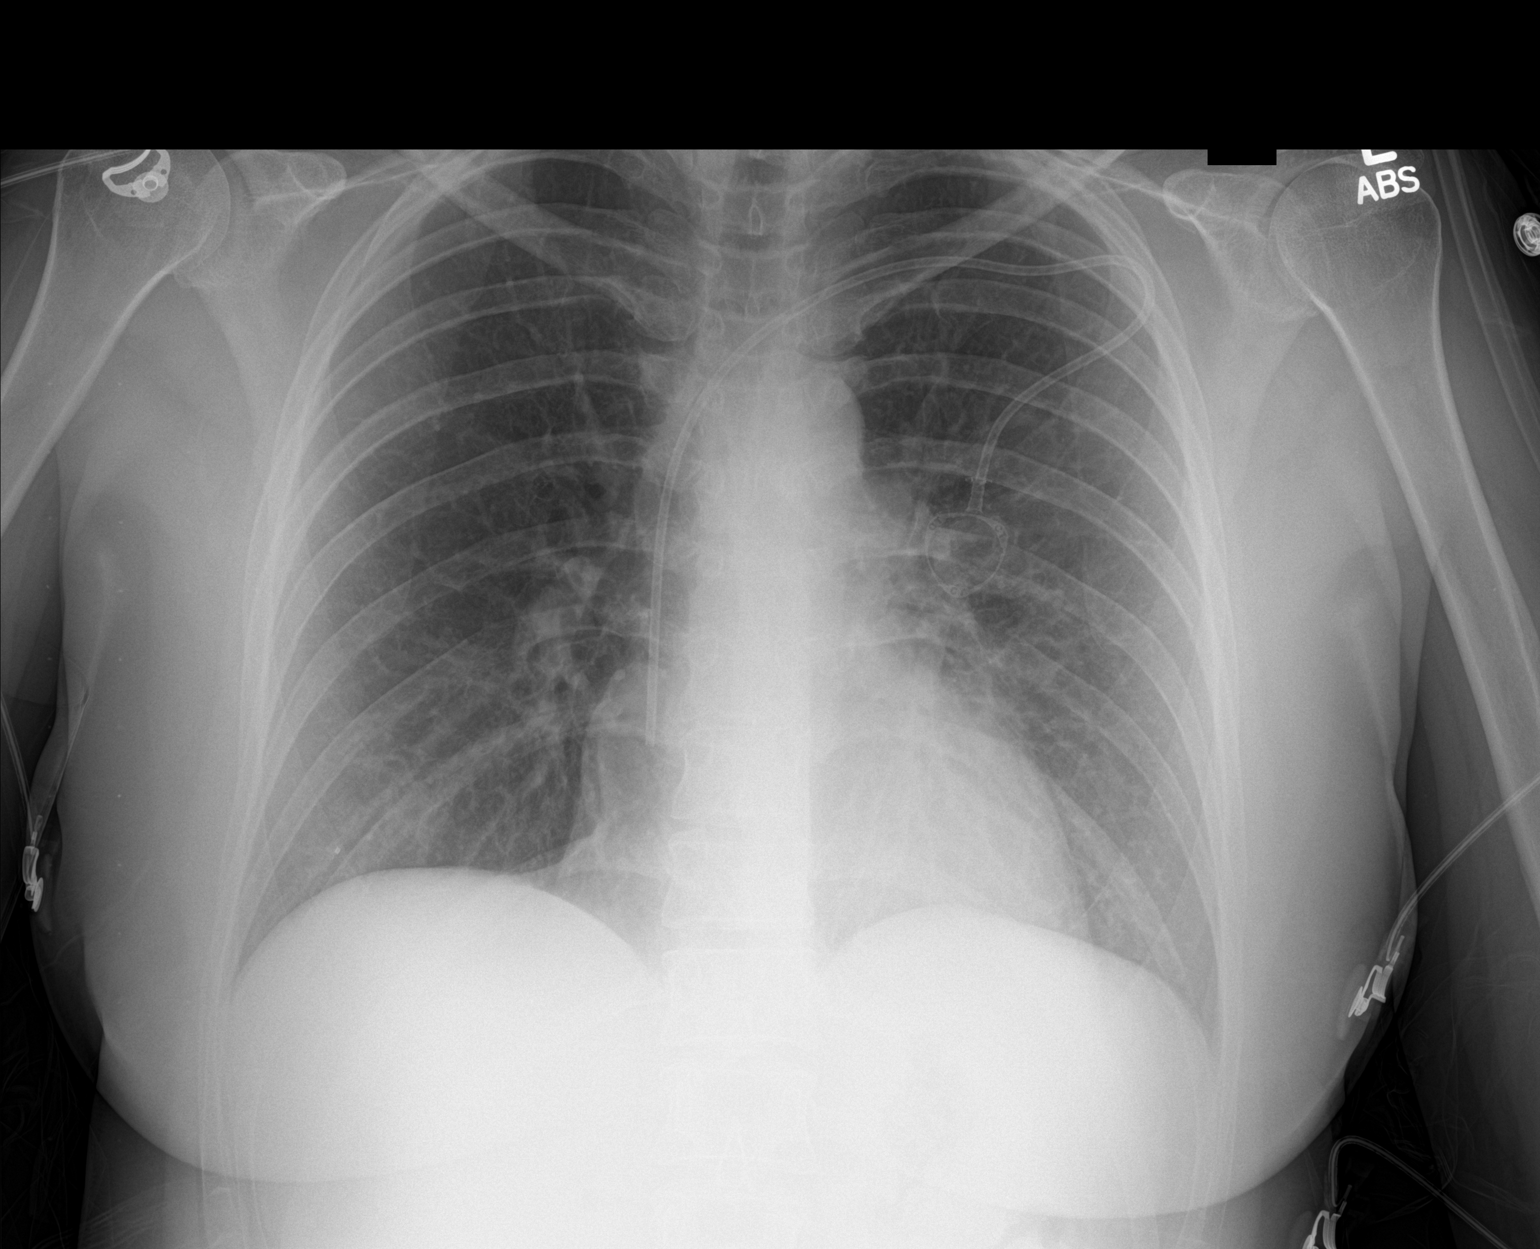

[1 of 1 positions shown; findings below may reference images not displayed]

FINDINGS: Cardiac shadow is within normal limits. The lungs are well aerated
bilaterally. No pneumothorax is noted following port placement.
Catheter tip is noted at the cavoatrial junction in satisfactory
position. No bony abnormality is seen.
IMPRESSION: No evidence of pneumothorax following port placement.

## 2017-10-24 SURGERY — INSERTION, TUNNELED CENTRAL VENOUS DEVICE, WITH PORT
Anesthesia: General | Laterality: Left | Wound class: Clean

## 2017-10-24 MED ORDER — LIDOCAINE HCL (PF) 1 % IJ SOLN
INTRAMUSCULAR | Status: AC
  Filled 2017-10-24: qty 30

## 2017-10-24 MED ORDER — SODIUM CHLORIDE 0.9 % IJ SOLN
INTRAMUSCULAR | Status: DC | PRN
  Administered 2017-10-24: 10 mL via INTRAVENOUS

## 2017-10-24 MED ORDER — SODIUM CHLORIDE 0.9 % IJ SOLN
INTRAMUSCULAR | Status: AC
  Filled 2017-10-24: qty 50

## 2017-10-24 MED ORDER — CEFAZOLIN SODIUM-DEXTROSE 2-4 GM/100ML-% IV SOLN
2.0000 g | INTRAVENOUS | Status: AC
  Administered 2017-10-24: 2 g via INTRAVENOUS

## 2017-10-24 MED ORDER — CEFAZOLIN SODIUM-DEXTROSE 2-4 GM/100ML-% IV SOLN
INTRAVENOUS | Status: AC
  Filled 2017-10-24: qty 100

## 2017-10-24 MED ORDER — FAMOTIDINE 20 MG PO TABS
ORAL_TABLET | ORAL | Status: AC
  Administered 2017-10-24: 20 mg via ORAL
  Filled 2017-10-24: qty 1

## 2017-10-24 MED ORDER — HYDROCODONE-ACETAMINOPHEN 5-325 MG PO TABS: 1.0000 | ORAL_TABLET | ORAL | 0 refills | Status: DC | PRN

## 2017-10-24 MED ORDER — FENTANYL CITRATE (PF) 100 MCG/2ML IJ SOLN
INTRAMUSCULAR | Status: DC | PRN
  Administered 2017-10-24 (×2): 50 ug via INTRAVENOUS

## 2017-10-24 MED ORDER — FAMOTIDINE 20 MG PO TABS
20.0000 mg | ORAL_TABLET | Freq: Once | ORAL | Status: AC
  Administered 2017-10-24: 20 mg via ORAL

## 2017-10-24 MED ORDER — PROPOFOL 500 MG/50ML IV EMUL
INTRAVENOUS | Status: DC | PRN
  Administered 2017-10-24: 75 ug/kg/min via INTRAVENOUS

## 2017-10-24 MED ORDER — PROPOFOL 10 MG/ML IV BOLUS
INTRAVENOUS | Status: AC
  Filled 2017-10-24: qty 20

## 2017-10-24 MED ORDER — FENTANYL CITRATE (PF) 100 MCG/2ML IJ SOLN
INTRAMUSCULAR | Status: AC
  Filled 2017-10-24: qty 2

## 2017-10-24 MED ORDER — LACTATED RINGERS IV SOLN
INTRAVENOUS | Status: DC
  Administered 2017-10-24: 11:00:00 via INTRAVENOUS

## 2017-10-24 MED ORDER — MIDAZOLAM HCL 2 MG/2ML IJ SOLN
INTRAMUSCULAR | Status: AC
Start: 2017-10-24 — End: 2017-10-24
  Filled 2017-10-24: qty 2

## 2017-10-24 MED ORDER — PROPOFOL 10 MG/ML IV BOLUS
INTRAVENOUS | Status: DC | PRN
  Administered 2017-10-24 (×5): 20 mg via INTRAVENOUS

## 2017-10-24 MED ORDER — LIDOCAINE HCL 1 % IJ SOLN
INTRAMUSCULAR | Status: DC | PRN
  Administered 2017-10-24: 16 mL

## 2017-10-24 MED ORDER — MIDAZOLAM HCL 2 MG/2ML IJ SOLN
INTRAMUSCULAR | Status: DC | PRN
  Administered 2017-10-24: 2 mg via INTRAVENOUS

## 2017-10-24 SURGICAL SUPPLY — 28 items
BLADE SURG 15 STRL SS SAFETY (BLADE) ×2 IMPLANT
CHLORAPREP W/TINT 26ML (MISCELLANEOUS) ×2 IMPLANT
COVER LIGHT HANDLE STERIS (MISCELLANEOUS) ×4 IMPLANT
DECANTER SPIKE VIAL GLASS SM (MISCELLANEOUS) ×4 IMPLANT
DRAPE C-ARM XRAY 36X54 (DRAPES) ×2 IMPLANT
DRAPE LAPAROTOMY TRNSV 106X77 (MISCELLANEOUS) ×2 IMPLANT
DRSG TEGADERM 2-3/8X2-3/4 SM (GAUZE/BANDAGES/DRESSINGS) ×2 IMPLANT
DRSG TEGADERM 4X4.75 (GAUZE/BANDAGES/DRESSINGS) ×2 IMPLANT
DRSG TELFA 4X3 1S NADH ST (GAUZE/BANDAGES/DRESSINGS) ×2 IMPLANT
ELECT REM PT RETURN 9FT ADLT (ELECTROSURGICAL) ×2
ELECTRODE REM PT RTRN 9FT ADLT (ELECTROSURGICAL) ×1 IMPLANT
GLOVE BIO SURGEON STRL SZ7.5 (GLOVE) ×2 IMPLANT
GLOVE INDICATOR 8.0 STRL GRN (GLOVE) ×2 IMPLANT
GOWN STRL REUS W/ TWL LRG LVL3 (GOWN DISPOSABLE) ×2 IMPLANT
GOWN STRL REUS W/TWL LRG LVL3 (GOWN DISPOSABLE) ×2
KIT PORT POWER 8FR ISP CVUE (Miscellaneous) ×2 IMPLANT
KIT RM TURNOVER STRD PROC AR (KITS) ×2 IMPLANT
LABEL OR SOLS (LABEL) ×2 IMPLANT
NS IRRIG 500ML POUR BTL (IV SOLUTION) ×2 IMPLANT
PACK PORT-A-CATH (MISCELLANEOUS) ×2 IMPLANT
STRIP CLOSURE SKIN 1/2X4 (GAUZE/BANDAGES/DRESSINGS) ×2 IMPLANT
SUT PROLENE 3 0 SH DA (SUTURE) ×2 IMPLANT
SUT VIC AB 3-0 SH 27 (SUTURE) ×1
SUT VIC AB 3-0 SH 27X BRD (SUTURE) ×1 IMPLANT
SUT VIC AB 4-0 FS2 27 (SUTURE) ×2 IMPLANT
SWABSTK COMLB BENZOIN TINCTURE (MISCELLANEOUS) ×2 IMPLANT
SYR 10ML SLIP (SYRINGE) ×2 IMPLANT
SYRINGE 10CC LL (SYRINGE) ×2 IMPLANT

## 2017-10-24 NOTE — Anesthesia Postprocedure Evaluation (Signed)
Anesthesia Post Note  Patient: Breon Diss  Procedure(s) Performed: INSERTION PORT-A-CATH (Left )  Patient location during evaluation: PACU Anesthesia Type: General Level of consciousness: awake and alert Pain management: pain level controlled Vital Signs Assessment: post-procedure vital signs reviewed and stable Respiratory status: spontaneous breathing, nonlabored ventilation, respiratory function stable and patient connected to nasal cannula oxygen Cardiovascular status: stable and blood pressure returned to baseline Postop Assessment: no apparent nausea or vomiting Anesthetic complications: no     Last Vitals:  Vitals:   10/24/17 1225 10/24/17 1240  BP: 98/61 107/75  Pulse: 68 84  Resp: 18 20  Temp: 36.5 C 36.4 C  SpO2: 100% 100%    Last Pain:  Vitals:   10/24/17 1002  TempSrc: Oral                 Latisia Hilaire S

## 2017-10-24 NOTE — Anesthesia Procedure Notes (Signed)
Performed by: Arthor Gorter, CRNA Pre-anesthesia Checklist: Patient identified, Emergency Drugs available, Suction available, Patient being monitored and Timeout performed Patient Re-evaluated:Patient Re-evaluated prior to induction Oxygen Delivery Method: Nasal cannula Induction Type: IV induction       

## 2017-10-24 NOTE — Transfer of Care (Signed)
Immediate Anesthesia Transfer of Care Note  Patient: Theresa Chaney  Procedure(s) Performed: INSERTION PORT-A-CATH (Left )  Patient Location: PACU  Anesthesia Type:MAC  Level of Consciousness: awake and alert   Airway & Oxygen Therapy: Patient Spontanous Breathing and Patient connected to nasal cannula oxygen  Post-op Assessment: Report given to RN and Post -op Vital signs reviewed and stable  Post vital signs: Reviewed and stable  Last Vitals:  Vitals:   10/24/17 1002  BP: 113/70  Pulse: 87  Resp: 15  Temp: 36.7 C  SpO2: 100%    Last Pain:  Vitals:   10/24/17 1002  TempSrc: Oral         Complications: No apparent anesthesia complications

## 2017-10-24 NOTE — Anesthesia Post-op Follow-up Note (Signed)
Anesthesia QCDR form completed.        

## 2017-10-24 NOTE — H&P (Signed)
No change in clinical history or exam.  For left power port placement.  

## 2017-10-24 NOTE — Progress Notes (Signed)
  Oncology Nurse Navigator Documentation  Navigator Location: [facility] (10/24/17 0900)   )Navigator Encounter Type: Telephone (10/24/17 0900)                       Treatment Phase: Pre-Tx/Tx Discussion (10/24/17 0900) Barriers/Navigation Needs: Coordination of Care (10/24/17 0900)                          Time Spent with Patient: 45 (10/24/17 0900)   Called and informed patient of her follow-up appointment with Dr. Tasia Catchings on Monday at 10:30 and her chemo class on Tuesday 11/01/17 @ 9:00.  Encouraged her to bring a support person to both appointments.  She is agreeable.

## 2017-10-24 NOTE — Op Note (Signed)
Preoperative diagnosis: Right breast cancer, need for central venous access.  Postoperative diagnosis: Same.  Operative procedure: Left subclavian PowerPort placement with ultrasound and fluoroscopic guidance.  Operating Surgeon Hervey Ard, MD.  Anesthesia: Attended local, 16 cc 1% plain Xylocaine.  Estimated blood loss: Less than 5 cc.  Clinical note: This 36 year old woman was recently diagnosed with a stage II cancer for which neoadjuvant chemotherapy is planned.  Central venous access was requested by the treating oncologist.  Operative note: With the patient under adequate sedation the left chest neck and axilla was cleansed with ChloraPrep and draped.  And neck were cleansed with ChloraPrep and draped.  Ultrasound was used to confirm patency of the subclavian vein.  Local anesthetic was instilled.  Under ultrasound guidance the subclavian vein was cannulated followed by passage of a guidewire.  A few beats of tachycardia with passed to the heart were noted.  The dilator was placed followed by the catheter itself.  Fluoroscopically, the catheter tip was placed at the junction of the SVC and right atrium.  This was tunneled to a pocket on the left anterior chest.  The port was anchored with two-point fixation making use of 3-0 Prolene sutures.  The subcutaneous tissue was closed with a running 3-0 Vicryl suture.  Skin closed with a running 4-0 Vicryl subcuticular suture.  Benzoin, Steri-Strips, Telfa and Tegaderm dressing applied.  Erect portable chest x-ray obtained on presentation of the recovery room showed no evidence of pneumothorax and the catheter tip as described above.

## 2017-10-24 NOTE — Anesthesia Preprocedure Evaluation (Signed)
Anesthesia Evaluation  Patient identified by MRN, date of birth, ID band Patient awake    Reviewed: Allergy & Precautions, NPO status , Patient's Chart, lab work & pertinent test results, reviewed documented beta blocker date and time   Airway Mallampati: II  TM Distance: >3 FB     Dental  (+) Chipped   Pulmonary Current Smoker,           Cardiovascular      Neuro/Psych PSYCHIATRIC DISORDERS Depression    GI/Hepatic   Endo/Other    Renal/GU      Musculoskeletal   Abdominal   Peds  Hematology   Anesthesia Other Findings   Reproductive/Obstetrics                             Anesthesia Physical Anesthesia Plan  ASA: II  Anesthesia Plan: General   Post-op Pain Management:    Induction: Intravenous  PONV Risk Score and Plan:   Airway Management Planned:   Additional Equipment:   Intra-op Plan:   Post-operative Plan:   Informed Consent: I have reviewed the patients History and Physical, chart, labs and discussed the procedure including the risks, benefits and alternatives for the proposed anesthesia with the patient or authorized representative who has indicated his/her understanding and acceptance.     Plan Discussed with: CRNA  Anesthesia Plan Comments:         Anesthesia Quick Evaluation

## 2017-10-24 NOTE — Discharge Instructions (Signed)

## 2017-10-25 ENCOUNTER — Encounter: Payer: Self-pay | Admitting: General Surgery

## 2017-10-26 ENCOUNTER — Ambulatory Visit
Admission: RE | Admit: 2017-10-26 | Discharge: 2017-10-26 | Disposition: A | Discharge status: Home / Self Care | Payer: BLUE CROSS/BLUE SHIELD | Source: Ambulatory Visit | Attending: Oncology | Admitting: Oncology

## 2017-10-26 DIAGNOSIS — C50811 Malignant neoplasm of overlapping sites of right female breast: Secondary | ICD-10-CM | POA: Diagnosis not present

## 2017-10-26 DIAGNOSIS — I5189 Other ill-defined heart diseases: Secondary | ICD-10-CM | POA: Insufficient documentation

## 2017-10-26 DIAGNOSIS — Z170 Estrogen receptor positive status [ER+]: Secondary | ICD-10-CM | POA: Diagnosis not present

## 2017-10-26 NOTE — Progress Notes (Signed)
*  PRELIMINARY RESULTS* Echocardiogram 2D Echocardiogram has been performed.  Sherrie Sport 10/26/2017, 10:31 AM

## 2017-10-27 NOTE — Patient Instructions (Signed)
Doxorubicin injection What is this medicine? DOXORUBICIN (dox oh ROO bi sin) is a chemotherapy drug. It is used to treat many kinds of cancer like leukemia, lymphoma, neuroblastoma, sarcoma, and Wilms' tumor. It is also used to treat bladder cancer, breast cancer, lung cancer, ovarian cancer, stomach cancer, and thyroid cancer. This medicine may be used for other purposes; ask your health care provider or pharmacist if you have questions. COMMON BRAND NAME(S): Adriamycin, Adriamycin PFS, Adriamycin RDF, Rubex What should I tell my health care provider before I take this medicine? They need to know if you have any of these conditions: -heart disease -history of low blood counts caused by a medicine -liver disease -recent or ongoing radiation therapy -an unusual or allergic reaction to doxorubicin, other chemotherapy agents, other medicines, foods, dyes, or preservatives -pregnant or trying to get pregnant -breast-feeding How should I use this medicine? This drug is given as an infusion into a vein. It is administered in a hospital or clinic by a specially trained health care professional. If you have pain, swelling, burning or any unusual feeling around the site of your injection, tell your health care professional right away. Talk to your pediatrician regarding the use of this medicine in children. Special care may be needed. Overdosage: If you think you have taken too much of this medicine contact a poison control center or emergency room at once. NOTE: This medicine is only for you. Do not share this medicine with others. What if I miss a dose? It is important not to miss your dose. Call your doctor or health care professional if you are unable to keep an appointment. What may interact with this medicine? This medicine may interact with the following medications: -6-mercaptopurine -paclitaxel -phenytoin -St. John's Wort -trastuzumab -verapamil This list may not describe all possible  interactions. Give your health care provider a list of all the medicines, herbs, non-prescription drugs, or dietary supplements you use. Also tell them if you smoke, drink alcohol, or use illegal drugs. Some items may interact with your medicine. What should I watch for while using this medicine? This drug may make you feel generally unwell. This is not uncommon, as chemotherapy can affect healthy cells as well as cancer cells. Report any side effects. Continue your course of treatment even though you feel ill unless your doctor tells you to stop. There is a maximum amount of this medicine you should receive throughout your life. The amount depends on the medical condition being treated and your overall health. Your doctor will watch how much of this medicine you receive in your lifetime. Tell your doctor if you have taken this medicine before. You may need blood work done while you are taking this medicine. Your urine may turn red for a few days after your dose. This is not blood. If your urine is dark or brown, call your doctor. In some cases, you may be given additional medicines to help with side effects. Follow all directions for their use. Call your doctor or health care professional for advice if you get a fever, chills or sore throat, or other symptoms of a cold or flu. Do not treat yourself. This drug decreases your body's ability to fight infections. Try to avoid being around people who are sick. This medicine may increase your risk to bruise or bleed. Call your doctor or health care professional if you notice any unusual bleeding. Talk to your doctor about your risk of cancer. You may be more at risk for certain   types of cancers if you take this medicine. Do not become pregnant while taking this medicine or for 6 months after stopping it. Women should inform their doctor if they wish to become pregnant or think they might be pregnant. Men should not father a child while taking this medicine and  for 6 months after stopping it. There is a potential for serious side effects to an unborn child. Talk to your health care professional or pharmacist for more information. Do not breast-feed an infant while taking this medicine. This medicine has caused ovarian failure in some women and reduced sperm counts in some men This medicine may interfere with the ability to have a child. Talk with your doctor or health care professional if you are concerned about your fertility. What side effects may I notice from receiving this medicine? Side effects that you should report to your doctor or health care professional as soon as possible: -allergic reactions like skin rash, itching or hives, swelling of the face, lips, or tongue -breathing problems -chest pain -fast or irregular heartbeat -low blood counts - this medicine may decrease the number of white blood cells, red blood cells and platelets. You may be at increased risk for infections and bleeding. -pain, redness, or irritation at site where injected -signs of infection - fever or chills, cough, sore throat, pain or difficulty passing urine -signs of decreased platelets or bleeding - bruising, pinpoint red spots on the skin, black, tarry stools, blood in the urine -swelling of the ankles, feet, hands -tiredness -weakness Side effects that usually do not require medical attention (report to your doctor or health care professional if they continue or are bothersome): -diarrhea -hair loss -mouth sores -nail discoloration or damage -nausea -red colored urine -vomiting This list may not describe all possible side effects. Call your doctor for medical advice about side effects. You may report side effects to FDA at 1-800-FDA-1088. Where should I keep my medicine? This drug is given in a hospital or clinic and will not be stored at home. NOTE: This sheet is a summary. It may not cover all possible information. If you have questions about this  medicine, talk to your doctor, pharmacist, or health care provider.  2018 [employer] Standard (2016-01-25 11:28:51) Cyclophosphamide injection What is this medicine? CYCLOPHOSPHAMIDE (sye kloe FOSS fa mide) is a chemotherapy drug. It slows the growth of cancer cells. This medicine is used to treat many types of cancer like lymphoma, myeloma, leukemia, breast cancer, and ovarian cancer, to name a few. This medicine may be used for other purposes; ask your health care provider or pharmacist if you have questions. COMMON BRAND NAME(S): Cytoxan, Neosar What should I tell my health care provider before I take this medicine? They need to know if you have any of these conditions: -blood disorders -history of other chemotherapy -infection -kidney disease -liver disease -recent or ongoing radiation therapy -tumors in the bone marrow -an unusual or allergic reaction to cyclophosphamide, other chemotherapy, other medicines, foods, dyes, or preservatives -pregnant or trying to get pregnant -breast-feeding How should I use this medicine? This drug is usually given as an injection into a vein or muscle or by infusion into a vein. It is administered in a hospital or clinic by a specially trained health care professional. Talk to your pediatrician regarding the use of this medicine in children. Special care may be needed. Overdosage: If you think you have taken too much of this medicine contact a poison control center or emergency room at   once. NOTE: This medicine is only for you. Do not share this medicine with others. What if I miss a dose? It is important not to miss your dose. Call your doctor or health care professional if you are unable to keep an appointment. What may interact with this medicine? This medicine may interact with the following medications: -amiodarone -amphotericin B -azathioprine -certain antiviral medicines for HIV or AIDS such as protease inhibitors (e.g., indinavir,  ritonavir) and zidovudine -certain blood pressure medications such as benazepril, captopril, enalapril, fosinopril, lisinopril, moexipril, monopril, perindopril, quinapril, ramipril, trandolapril -certain cancer medications such as anthracyclines (e.g., daunorubicin, doxorubicin), busulfan, cytarabine, paclitaxel, pentostatin, tamoxifen, trastuzumab -certain diuretics such as chlorothiazide, chlorthalidone, hydrochlorothiazide, indapamide, metolazone -certain medicines that treat or prevent blood clots like warfarin -certain muscle relaxants such as succinylcholine -cyclosporine -etanercept -indomethacin -medicines to increase blood counts like filgrastim, pegfilgrastim, sargramostim -medicines used as general anesthesia -metronidazole -natalizumab This list may not describe all possible interactions. Give your health care provider a list of all the medicines, herbs, non-prescription drugs, or dietary supplements you use. Also tell them if you smoke, drink alcohol, or use illegal drugs. Some items may interact with your medicine. What should I watch for while using this medicine? Visit your doctor for checks on your progress. This drug may make you feel generally unwell. This is not uncommon, as chemotherapy can affect healthy cells as well as cancer cells. Report any side effects. Continue your course of treatment even though you feel ill unless your doctor tells you to stop. Drink water or other fluids as directed. Urinate often, even at night. In some cases, you may be given additional medicines to help with side effects. Follow all directions for their use. Call your doctor or health care professional for advice if you get a fever, chills or sore throat, or other symptoms of a cold or flu. Do not treat yourself. This drug decreases your body's ability to fight infections. Try to avoid being around people who are sick. This medicine may increase your risk to bruise or bleed. Call your doctor or  health care professional if you notice any unusual bleeding. Be careful brushing and flossing your teeth or using a toothpick because you may get an infection or bleed more easily. If you have any dental work done, tell your dentist you are receiving this medicine. You may get drowsy or dizzy. Do not drive, use machinery, or do anything that needs mental alertness until you know how this medicine affects you. Do not become pregnant while taking this medicine or for 1 year after stopping it. Women should inform their doctor if they wish to become pregnant or think they might be pregnant. Men should not father a child while taking this medicine and for 4 months after stopping it. There is a potential for serious side effects to an unborn child. Talk to your health care professional or pharmacist for more information. Do not breast-feed an infant while taking this medicine. This medicine may interfere with the ability to have a child. This medicine has caused ovarian failure in some women. This medicine has caused reduced sperm counts in some men. You should talk with your doctor or health care professional if you are concerned about your fertility. If you are going to have surgery, tell your doctor or health care professional that you have taken this medicine. What side effects may I notice from receiving this medicine? Side effects that you should report to your doctor or health care professional as   soon as possible: -allergic reactions like skin rash, itching or hives, swelling of the face, lips, or tongue -low blood counts - this medicine may decrease the number of white blood cells, red blood cells and platelets. You may be at increased risk for infections and bleeding. -signs of infection - fever or chills, cough, sore throat, pain or difficulty passing urine -signs of decreased platelets or bleeding - bruising, pinpoint red spots on the skin, black, tarry stools, blood in the urine -signs of  decreased red blood cells - unusually weak or tired, fainting spells, lightheadedness -breathing problems -dark urine -dizziness -palpitations -swelling of the ankles, feet, hands -trouble passing urine or change in the amount of urine -weight gain -yellowing of the eyes or skin Side effects that usually do not require medical attention (report to your doctor or health care professional if they continue or are bothersome): -changes in nail or skin color -hair loss -missed menstrual periods -mouth sores -nausea, vomiting This list may not describe all possible side effects. Call your doctor for medical advice about side effects. You may report side effects to FDA at 1-800-FDA-1088. Where should I keep my medicine? This drug is given in a hospital or clinic and will not be stored at home. NOTE: This sheet is a summary. It may not cover all possible information. If you have questions about this medicine, talk to your doctor, pharmacist, or health care provider.  2018 [employer] Standard (2012-10-12 16:22:58) Paclitaxel injection What is this medicine? PACLITAXEL (PAK li TAX el) is a chemotherapy drug. It targets fast dividing cells, like cancer cells, and causes these cells to die. This medicine is used to treat ovarian cancer, breast cancer, and other cancers. This medicine may be used for other purposes; ask your health care provider or pharmacist if you have questions. COMMON BRAND NAME(S): Onxol, Taxol What should I tell my health care provider before I take this medicine? They need to know if you have any of these conditions: -blood disorders -irregular heartbeat -infection (especially a virus infection such as chickenpox, cold sores, or herpes) -liver disease -previous or ongoing radiation therapy -an unusual or allergic reaction to paclitaxel, alcohol, polyoxyethylated castor oil, other chemotherapy agents, other medicines, foods, dyes, or preservatives -pregnant or trying to  get pregnant -breast-feeding How should I use this medicine? This drug is given as an infusion into a vein. It is administered in a hospital or clinic by a specially trained health care professional. Talk to your pediatrician regarding the use of this medicine in children. Special care may be needed. Overdosage: If you think you have taken too much of this medicine contact a poison control center or emergency room at once. NOTE: This medicine is only for you. Do not share this medicine with others. What if I miss a dose? It is important not to miss your dose. Call your doctor or health care professional if you are unable to keep an appointment. What may interact with this medicine? Do not take this medicine with any of the following medications: -disulfiram -metronidazole This medicine may also interact with the following medications: -cyclosporine -diazepam -ketoconazole -medicines to increase blood counts like filgrastim, pegfilgrastim, sargramostim -other chemotherapy drugs like cisplatin, doxorubicin, epirubicin, etoposide, teniposide, vincristine -quinidine -testosterone -vaccines -verapamil Talk to your doctor or health care professional before taking any of these medicines: -acetaminophen -aspirin -ibuprofen -ketoprofen -naproxen This list may not describe all possible interactions. Give your health care provider a list of all the medicines, herbs, non-prescription drugs, or   dietary supplements you use. Also tell them if you smoke, drink alcohol, or use illegal drugs. Some items may interact with your medicine. What should I watch for while using this medicine? Your condition will be monitored carefully while you are receiving this medicine. You will need important blood work done while you are taking this medicine. This medicine can cause serious allergic reactions. To reduce your risk you will need to take other medicine(s) before treatment with this medicine. If you  experience allergic reactions like skin rash, itching or hives, swelling of the face, lips, or tongue, tell your doctor or health care professional right away. In some cases, you may be given additional medicines to help with side effects. Follow all directions for their use. This drug may make you feel generally unwell. This is not uncommon, as chemotherapy can affect healthy cells as well as cancer cells. Report any side effects. Continue your course of treatment even though you feel ill unless your doctor tells you to stop. Call your doctor or health care professional for advice if you get a fever, chills or sore throat, or other symptoms of a cold or flu. Do not treat yourself. This drug decreases your body's ability to fight infections. Try to avoid being around people who are sick. This medicine may increase your risk to bruise or bleed. Call your doctor or health care professional if you notice any unusual bleeding. Be careful brushing and flossing your teeth or using a toothpick because you may get an infection or bleed more easily. If you have any dental work done, tell your dentist you are receiving this medicine. Avoid taking products that contain aspirin, acetaminophen, ibuprofen, naproxen, or ketoprofen unless instructed by your doctor. These medicines may hide a fever. Do not become pregnant while taking this medicine. Women should inform their doctor if they wish to become pregnant or think they might be pregnant. There is a potential for serious side effects to an unborn child. Talk to your health care professional or pharmacist for more information. Do not breast-feed an infant while taking this medicine. Men are advised not to father a child while receiving this medicine. This product may contain alcohol. Ask your pharmacist or healthcare provider if this medicine contains alcohol. Be sure to tell all healthcare providers you are taking this medicine. Certain medicines, like metronidazole  and disulfiram, can cause an unpleasant reaction when taken with alcohol. The reaction includes flushing, headache, nausea, vomiting, sweating, and increased thirst. The reaction can last from 30 minutes to several hours. What side effects may I notice from receiving this medicine? Side effects that you should report to your doctor or health care professional as soon as possible: -allergic reactions like skin rash, itching or hives, swelling of the face, lips, or tongue -low blood counts - This drug may decrease the number of white blood cells, red blood cells and platelets. You may be at increased risk for infections and bleeding. -signs of infection - fever or chills, cough, sore throat, pain or difficulty passing urine -signs of decreased platelets or bleeding - bruising, pinpoint red spots on the skin, black, tarry stools, nosebleeds -signs of decreased red blood cells - unusually weak or tired, fainting spells, lightheadedness -breathing problems -chest pain -high or low blood pressure -mouth sores -nausea and vomiting -pain, swelling, redness or irritation at the injection site -pain, tingling, numbness in the hands or feet -slow or irregular heartbeat -swelling of the ankle, feet, hands Side effects that usually do not  that usually do not require medical attention (report to your doctor or health care professional if they continue or are bothersome): -bone pain -complete hair loss including hair on your head, underarms, pubic hair, eyebrows, and eyelashes -changes in the color of fingernails -diarrhea -loosening of the fingernails -loss of appetite -muscle or joint pain -red flush to skin -sweating This list may not describe all possible side effects. Call your doctor for medical advice about side effects. You may report side effects to FDA at 1-800-FDA-1088. Where should I keep my medicine? This drug is given in a hospital or clinic and will not be stored at home. NOTE: This sheet is  a summary. It may not cover all possible information. If you have questions about this medicine, talk to your doctor, pharmacist, or health care provider.  2018 [employer] Standard (2015-09-29 19:58:00)  Pegfilgrastim injection What is this medicine? PEGFILGRASTIM (PEG fil gra stim) is a long-acting granulocyte colony-stimulating factor that stimulates the growth of neutrophils, a type of white blood cell important in the body's fight against infection. It is used to reduce the incidence of fever and infection in patients with certain types of cancer who are receiving chemotherapy that affects the bone marrow, and to increase survival after being exposed to high doses of radiation. This medicine may be used for other purposes; ask your health care provider or pharmacist if you have questions. COMMON BRAND NAME(S): Neulasta What should I tell my health care provider before I take this medicine? They need to know if you have any of these conditions: -kidney disease -latex allergy -ongoing radiation therapy -sickle cell disease -skin reactions to acrylic adhesives (On-Body Injector only) -an unusual or allergic reaction to pegfilgrastim, filgrastim, other medicines, foods, dyes, or preservatives -pregnant or trying to get pregnant -breast-feeding How should I use this medicine? This medicine is for injection under the skin. If you get this medicine at home, you will be taught how to prepare and give the pre-filled syringe or how to use the On-body Injector. Refer to the patient Instructions for Use for detailed instructions. Use exactly as directed. Tell your healthcare provider immediately if you suspect that the On-body Injector may not have performed as intended or if you suspect the use of the On-body Injector resulted in a missed or partial dose. It is important that you put your used needles and syringes in a special sharps container. Do not put them in a trash can. If you do not have a  sharps container, call your pharmacist or healthcare provider to get one. Talk to your pediatrician regarding the use of this medicine in children. While this drug may be prescribed for selected conditions, precautions do apply. Overdosage: If you think you have taken too much of this medicine contact a poison control center or emergency room at once. NOTE: This medicine is only for you. Do not share this medicine with others. What if I miss a dose? It is important not to miss your dose. Call your doctor or health care professional if you miss your dose. If you miss a dose due to an On-body Injector failure or leakage, a new dose should be administered as soon as possible using a single prefilled syringe for manual use. What may interact with this medicine? Interactions have not been studied. Give your health care provider a list of all the medicines, herbs, non-prescription drugs, or dietary supplements you use. Also tell them if you smoke, drink alcohol, or use illegal drugs. Some items may   interact with your medicine. This list may not describe all possible interactions. Give your health care provider a list of all the medicines, herbs, non-prescription drugs, or dietary supplements you use. Also tell them if you smoke, drink alcohol, or use illegal drugs. Some items may interact with your medicine. What should I watch for while using this medicine? You may need blood work done while you are taking this medicine. If you are going to need a MRI, CT scan, or other procedure, tell your doctor that you are using this medicine (On-Body Injector only). What side effects may I notice from receiving this medicine? Side effects that you should report to your doctor or health care professional as soon as possible: -allergic reactions like skin rash, itching or hives, swelling of the face, lips, or tongue -dizziness -fever -pain, redness, or irritation at site where injected -pinpoint red spots on the  skin -red or dark-brown urine -shortness of breath or breathing problems -stomach or side pain, or pain at the shoulder -swelling -tiredness -trouble passing urine or change in the amount of urine Side effects that usually do not require medical attention (report to your doctor or health care professional if they continue or are bothersome): -bone pain -muscle pain This list may not describe all possible side effects. Call your doctor for medical advice about side effects. You may report side effects to FDA at 1-800-FDA-1088. Where should I keep my medicine? Keep out of the reach of children. Store pre-filled syringes in a refrigerator between 2 and 8 degrees C (36 and 46 degrees F). Do not freeze. Keep in carton to protect from light. Throw away this medicine if it is left out of the refrigerator for more than 48 hours. Throw away any unused medicine after the expiration date. NOTE: This sheet is a summary. It may not cover all possible information. If you have questions about this medicine, talk to your doctor, pharmacist, or health care provider.  2018 [employer] (2016-11-24 12:58:03)   

## 2017-10-30 ENCOUNTER — Encounter: Payer: Self-pay | Admitting: Oncology

## 2017-10-30 ENCOUNTER — Telehealth: Payer: Self-pay | Admitting: *Deleted

## 2017-10-30 ENCOUNTER — Inpatient Hospital Stay (HOSPITAL_BASED_OUTPATIENT_CLINIC_OR_DEPARTMENT_OTHER): Payer: BLUE CROSS/BLUE SHIELD | Admitting: Oncology

## 2017-10-30 ENCOUNTER — Other Ambulatory Visit: Payer: Self-pay | Admitting: Oncology

## 2017-10-30 ENCOUNTER — Encounter: Payer: Self-pay | Admitting: Genetic Counselor

## 2017-10-30 ENCOUNTER — Other Ambulatory Visit: Payer: Self-pay

## 2017-10-30 ENCOUNTER — Telehealth: Payer: Self-pay | Admitting: Genetic Counselor

## 2017-10-30 VITALS — BP 105/65 | HR 84 | Temp 98.4°F | Resp 16 | Wt 156.4 lb

## 2017-10-30 DIAGNOSIS — Z170 Estrogen receptor positive status [ER+]: Secondary | ICD-10-CM

## 2017-10-30 DIAGNOSIS — K219 Gastro-esophageal reflux disease without esophagitis: Secondary | ICD-10-CM | POA: Diagnosis not present

## 2017-10-30 DIAGNOSIS — C50811 Malignant neoplasm of overlapping sites of right female breast: Secondary | ICD-10-CM | POA: Diagnosis not present

## 2017-10-30 DIAGNOSIS — Z7189 Other specified counseling: Secondary | ICD-10-CM

## 2017-10-30 DIAGNOSIS — Z801 Family history of malignant neoplasm of trachea, bronchus and lung: Secondary | ICD-10-CM | POA: Diagnosis not present

## 2017-10-30 DIAGNOSIS — F17210 Nicotine dependence, cigarettes, uncomplicated: Secondary | ICD-10-CM

## 2017-10-30 DIAGNOSIS — Z8052 Family history of malignant neoplasm of bladder: Secondary | ICD-10-CM

## 2017-10-30 DIAGNOSIS — Z79810 Long term (current) use of selective estrogen receptor modulators (SERMs): Secondary | ICD-10-CM

## 2017-10-30 DIAGNOSIS — Z85820 Personal history of malignant melanoma of skin: Secondary | ICD-10-CM

## 2017-10-30 DIAGNOSIS — Z793 Long term (current) use of hormonal contraceptives: Secondary | ICD-10-CM | POA: Diagnosis not present

## 2017-10-30 DIAGNOSIS — Z79899 Other long term (current) drug therapy: Secondary | ICD-10-CM

## 2017-10-30 DIAGNOSIS — Z809 Family history of malignant neoplasm, unspecified: Secondary | ICD-10-CM | POA: Insufficient documentation

## 2017-10-30 DIAGNOSIS — Z808 Family history of malignant neoplasm of other organs or systems: Secondary | ICD-10-CM

## 2017-10-30 DIAGNOSIS — R011 Cardiac murmur, unspecified: Secondary | ICD-10-CM

## 2017-10-30 DIAGNOSIS — F329 Major depressive disorder, single episode, unspecified: Secondary | ICD-10-CM | POA: Diagnosis not present

## 2017-10-30 DIAGNOSIS — Z716 Tobacco abuse counseling: Secondary | ICD-10-CM

## 2017-10-30 DIAGNOSIS — C50919 Malignant neoplasm of unspecified site of unspecified female breast: Secondary | ICD-10-CM

## 2017-10-30 MED ORDER — LIDOCAINE-PRILOCAINE 2.5-2.5 % EX CREA: TOPICAL_CREAM | CUTANEOUS | 3 refills | Status: DC

## 2017-10-30 MED ORDER — ONDANSETRON HCL 8 MG PO TABS: 8.0000 mg | ORAL_TABLET | Freq: Two times a day (BID) | ORAL | 1 refills | Status: DC | PRN

## 2017-10-30 MED ORDER — PROCHLORPERAZINE MALEATE 10 MG PO TABS: 10.0000 mg | ORAL_TABLET | Freq: Four times a day (QID) | ORAL | 1 refills | Status: DC | PRN

## 2017-10-30 NOTE — Progress Notes (Signed)
DISCONTINUE ON PATHWAY REGIMEN - Breast   Doxorubicin + Cyclophosphamide (AC):   A cycle is every 21 days:     Doxorubicin      Cyclophosphamide   **Always confirm dose/schedule in your pharmacy ordering system**    Paclitaxel 80 mg/m2 Weekly:   Administer weekly:     Paclitaxel   **Always confirm dose/schedule in your pharmacy ordering system**    REASON: Other Reason PRIOR TREATMENT: [id number]: AC-T - [Doxorubicin + Cyclophosphamide q21 Days x 4 Cycles, Followed by Paclitaxel Weekly x 12 Weeks] TREATMENT RESPONSE: N/A - Adjuvant Therapy  START ON PATHWAY REGIMEN - Breast   Dose-Dense AC q14 days:   A cycle is every 14 days:     Doxorubicin      Cyclophosphamide      Pegfilgrastim-xxxx   **Always confirm dose/schedule in your pharmacy ordering system**    Paclitaxel 80 mg/m2 Weekly:   Administer weekly:     Paclitaxel   **Always confirm dose/schedule in your pharmacy ordering system**    Patient Characteristics: Preoperative or Nonsurgical Candidate (Clinical Staging), Neoadjuvant Therapy followed by Surgery, Invasive Disease, Chemotherapy, HER2 Negative/Unknown/Equivocal, ER Positive Therapeutic Status: Preoperative or Nonsurgical Candidate (Clinical Staging) AJCC M Category: cM0 AJCC Grade: G2 Breast Surgical Plan: Neoadjuvant Therapy followed by Surgery ER Status: Positive (+) AJCC 8 Stage Grouping: IB HER2 Status: Negative (-) AJCC T Category: cT2 AJCC N Category: cN0 PR Status: Positive (+) Intent of Therapy: Curative Intent, Discussed with Patient

## 2017-10-30 NOTE — Progress Notes (Signed)
Hematology/Oncology Follow Up Note [facility] [phone] [phone]  Patient Care Team: Juluis Pitch, MD as PCP - General (Family Medicine)   Name of the patient: Brandee Markin  [id number]  March 24, 1981   Date of visit: 10/30/17 REASON FOR [place]:  Newly diagnosed breast cancer  History of presenting illness 36 year old female with past medical history listed as below presented for evaluation of newly diagnosed right breast cancer. Patient self palpated a right breast mass 2 months ago during bathing. This triggered workup including mammogram and ultrasound. 10/05/2017 mammogram diagnostic breast tomo bilaterally showed multicentric area had a suspicion of breast cancer. 1  highly suspicious irregular mass with architectural distortion in the upper right breast at posterior depth measuring approximately 2 cm greatest dimension with associated architectural distortion and pleomorphic micro-calcifications. On ultrasound of vague hypoechoic masslike areas seen in the right breast at 11:30 o'clock axis. 2 second areas irregular hypoechoic mass in the right breast at the 11:30 o'clock axis, 6 cm from the nipple, at the superficial depth measuring 1.2 cm, likely correlate for a highly suspicious 1.4 cm extent of pleomorphic and a fine linear branching calcifications. 3 additional highly suspicious pleomorphic calcifications within the upper retroareolar right breast extending to this subareolar right breast, measuring 3.7 cm extending.  Right axilla was evaluated with ultrasound showing no enlarged or morphologically abnormal lymph nodes.  Patient underwent stereotactic biopsy, on 10/11/2017. Pathology showed A, carcinoma in situ intermediate to high grade with necrosis and calcifications at the right breast 11:30. B, right breast 5:30 subareolar, invasive carcinoma with diffusely infiltrative pattern in the lymphovascular invasion. In a background  of intermediate grade carcinoma in situ with necrosis and calcifications. C right breast 12:30, posterior common invasive mammary carcinoma of her type, associated with high-grade DCIS with necrosis and calcifications. Right breast 5:30 subareolar, ER +75%, PR negative HER-2 negative. Right breast 12:30 posterior, and ER +90%, and PR negative, and HER-2 negative.  Today patient was accompanied by her husband to the clinic to discuss about the biology results and treatment plans. She denies any back pain, headache, chest pain, shortness of breath, abdominal pain. She reports previously healthy other than taking some mood disorder medication.  She is on depo shot for contraceptive measures. Her aunt has a history of melanoma. . She is married, and she has a Psychiatrist and a biological daughter who is 22 years old. . She reports that she has a history of heart murmur which was diagnosed by primary care physician. She's never had any other health problems.   INTERVAL HISTORY Patient presents to discuss about treatment plan. She has no new complaints. She had a MediPort placed. 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 She asks if she needs to be off work during chemotherapy.   Review of Systems  Constitutional: Negative for weight loss.  HENT: Negative for hearing loss and tinnitus.   Eyes: Negative for blurred vision.  Respiratory: Negative for cough.   Cardiovascular: Negative for chest pain.  Gastrointestinal: Negative for heartburn.  Genitourinary: Negative for dysuria.  Musculoskeletal: Negative for myalgias.  Neurological: Negative for dizziness.  Endo/Heme/Allergies: Does not bruise/bleed easily.  Psychiatric/Behavioral: The patient is nervous/anxious.     No Known Allergies  Patient Active Problem List   Diagnosis Date Noted  . Malignant neoplasm of overlapping sites of right breast in female, estrogen receptor positive ([place]) 10/19/2017     Past  Medical History:  Diagnosis Date  . Depression   .  GERD (gastroesophageal reflux disease)      Past Surgical History:  Procedure Laterality Date  . BREAST BIOPSY Right 10/11/2017   12:00 posterior coil clip  . BREAST BIOPSY Right 10/11/2017   11:30 middle depth ribbon clip  . BREAST BIOPSY Right 10/11/2017   5:30 anterior depth x shape  . INSERTION PORT-A-CATH Left 10/24/2017   Performed by Robert Bellow, MD at [facility] ORS    Social History   Socioeconomic History  . Marital status: Married    Spouse name: Not on file  . Number of children: Not on file  . Years of education: Not on file  . Highest education level: Not on file  Social Needs  . Financial resource strain: Not on file  . Food insecurity - worry: Not on file  . Food insecurity - inability: Not on file  . Transportation needs - medical: Not on file  . Transportation needs - non-medical: Not on file  Occupational History  . Occupation: Occupational psychologist    Comment: Therapist, art   Tobacco Use  . Smoking status: Current Every Day Smoker    Packs/day: 1.00    Years: 5.00    Pack years: 5.00    Types: Cigarettes  . Smokeless tobacco: Never Used  Substance and Sexual Activity  . Alcohol use: No    Frequency: Never  . Drug use: No  . Sexual activity: Yes    Birth control/protection: Injection  Other Topics Concern  . Not on file  Social History Narrative  . Not on file     Family History  Problem Relation Age of Onset  . Melanoma Maternal Aunt   . Melanoma Mother 15  . Diabetes Father   . Hypertension Father   . Hyperlipidemia Father   . Bladder Cancer Maternal Grandmother   . Lung cancer Maternal Aunt      Current Outpatient Medications:  .  ALPRAZolam (XANAX) 0.25 MG tablet, Take 0.25 mg at bedtime as needed by mouth for anxiety or sleep. , Disp: , Rfl:  .  Calcium Carb-Cholecalciferol (CALCIUM 600 + D PO), Take 1 tablet daily by mouth., Disp: , Rfl:  .  escitalopram  (LEXAPRO) 20 MG tablet, Take 20 mg daily by mouth., Disp: , Rfl:  .  esomeprazole (NEXIUM) 40 MG capsule, Take 40 mg daily by mouth., Disp: , Rfl:  .  HYDROcodone-acetaminophen (NORCO) 5-325 MG tablet, Take 1 tablet every 4 (four) hours as needed by mouth., Disp: 15 tablet, Rfl: 0 .  ibuprofen (ADVIL,MOTRIN) 800 MG tablet, Take 800 mg every 8 (eight) hours as needed by mouth for moderate pain., Disp: , Rfl:  .  medroxyPROGESTERone (DEPO-PROVERA) 150 MG/ML injection, Inject 150 mg every 3 months, Disp: , Rfl:  .  zolpidem (AMBIEN) 5 MG tablet, Take 5 mg at bedtime by mouth., Disp: , Rfl:    Physical exam:  Vitals:   10/30/17 1053  BP: 105/65  Pulse: 84  Resp: 16  Temp: 98.4 F (36.9 C)  TempSrc: Tympanic  Weight: 156 lb 7 oz (71 kg)  ECOG 0 GENERAL: No distress, well nourished.  SKIN:  No rashes or significant lesions  HEAD: Normocephalic, No masses, lesions, tenderness or abnormalities  EYES: Conjunctiva are pink, non icteric ENT: External ears normal ,lips , buccal mucosa, and tongue normal and mucous membranes are moist  LYMPH: No palpable cervical and axillary lymphadenopathy  LUNGS: Clear to auscultation, no crackles or wheezes HEART: Regular rate & rhythm, no murmurs, no  gallops, S1 normal and S2 normal  ABDOMEN: Abdomen soft, non-tender, normal bowel sounds, I did not appreciate any  masses or organomegaly  MUSCULOSKELETAL: No CVA tenderness and no tenderness on percussion of the back or rib cage.  EXTREMITIES: No edema, no skin discoloration or tenderness NEURO: Alert & oriented, no focal motor/sensory deficits. Breasts: palpable right breasts mass at 11:30, no skin or nipple changes or axillary nodes. Left breast no palpable mass or lymph node.     CMP Latest Ref Rng & Units 10/19/2017  Glucose 65 - 99 mg/dL 98  BUN 6 - 20 mg/dL 12  Creatinine 0.44 - 1.00 mg/dL 0.69  Sodium 135 - 145 mmol/L 138  Potassium 3.5 - 5.1 mmol/L 3.4(L)  Chloride 101 - 111 mmol/L 104  CO2 22  - 32 mmol/L 24  Calcium 8.9 - 10.3 mg/dL 9.2  Total Protein 6.5 - 8.1 g/dL 8.6(H)  Total Bilirubin 0.3 - 1.2 mg/dL 0.5  Alkaline Phos 38 - 126 U/L 81  AST 15 - 41 U/L 19  ALT 14 - 54 U/L 11(L)   CBC Latest Ref Rng & Units 10/19/2017  WBC 3.6 - 11.0 K/uL 15.1(H)  Hemoglobin 12.0 - 16.0 g/dL 14.4  Hematocrit 35.0 - 47.0 % 41.9  Platelets 150 - 440 K/uL 303    No images are attached to the encounter.  Dg Chest Port 1 View  Result Date: 10/24/2017 CLINICAL DATA:  Status post port placement EXAM: PORTABLE CHEST 1 VIEW COMPARISON:  07/22/2016 FINDINGS: Cardiac shadow is within normal limits. The lungs are well aerated bilaterally. No pneumothorax is noted following port placement. Catheter tip is noted at the cavoatrial junction in satisfactory position. No bony abnormality is seen. IMPRESSION: No evidence of pneumothorax following port placement. Electronically Signed   By: Inez Catalina M.D.   On: 10/24/2017 12:40   Dg C-arm 1-60 Min-no Report  Result Date: 10/24/2017 Fluoroscopy was utilized by the requesting physician.  No radiographic interpretation.   US Breast Ltd Uni Right Inc Axilla  Addendum Date: 10/05/2017   ADDENDUM REPORT: 10/05/2017 14:07 ADDENDUM: Right axilla was evaluated with ultrasound showing no enlarged or morphologically abnormal lymph nodes. Electronically Signed   By: Franki Cabot M.D.   On: 10/05/2017 14:07   Result Date: 10/05/2017 CLINICAL DATA:  36 year old female with palpable right breast mass. EXAM: 2D DIGITAL DIAGNOSTIC BILATERAL MAMMOGRAM WITH CAD AND ADJUNCT TOMO ULTRASOUND RIGHT BREAST COMPARISON:  None. ACR Breast Density Category c: The breast tissue is heterogeneously dense, which may obscure small masses. FINDINGS: Bilateral 2D CC and MLO views were obtained today, with additional 3D tomosynthesis, and with additional spot compression view of the upper right breast corresponding to the area of clinical concern. There is an irregular mass with  architectural distortion within the upper right breast, at posterior depth, measuring approximately 2 cm greatest dimension, with associated architectural distortion and pleomorphic microcalcifications. Additional grouped pleomorphic and fine linear branching calcifications are identified within the upper right breast, 12 o'clock axis region, measuring 1.4 cm extent. Additional suspicious pleomorphic calcifications are identified within the upper retroareolar right breast extending to the subareolar right breast, spanning 3.7 cm as measured on the true lateral view. Mammographic images were processed with CAD. On physical exam, firm palpable mass is identified within the upper right breast. Targeted ultrasound is performed, showing an irregular hypoechoic mass in the right breast at the 11:30 o'clock axis, 6 cm from the nipple, measuring 1.2 x 1.1 x 0.7 cm, with associated microcalcifications, a likely  correlate for the 1.4 cm extent of pleomorphic and fine linear calcifications seen in the upper right breast on mammogram. Additional vague hypoechoic masslike area is identified within the right breast at the 11:30 o'clock axis, 5 cm from the nipple, at posterior depth, measuring 2.6 x 2.4 x 1.5 cm, a likely correlate for the dominant mass seen on mammogram, better seen by mammogram. IMPRESSION: 1. Highly suspicious irregular mass with architectural distortion in the upper RIGHT breast, at POSTERIOR depth, measuring approximately 2 cm greatest dimension with associated architectural distortion and pleomorphic microcalcifications. On ultrasound, a vague hypoechoic masslike area is seen in the right breast at the 11:30 o'clock axis, 5 cm from the nipple, measuring 2.6 cm, a likely correlate for the dominant mass seen on mammogram, but better seen on mammogram. As such, stereotactic biopsy with 3D tomosynthesis guidance is recommended for this dominant mass. 2. Irregular hypoechoic mass in the RIGHT breast at the 11:30  o'clock axis, 6 cm from the nipple, at SUPERFICIAL depth, measuring 1.2 cm, a likely correlate for the highly suspicious 1.4 cm extent of pleomorphic and fine linear branching calcifications identified in the upper right breast on mammogram. Recommend ultrasound-guided biopsy to define upper extent of disease. This could be performed as an additional stereotactic biopsy at the discretion of the performing radiologist. 3. Additional highly suspicious pleomorphic calcifications within the upper retroareolar right breast extending to the SUBAREOLAR right breast, measuring 3.7 cm extent. Recommend a third same day biopsy of the most anterior calcifications, using stereotactic guidance, to define anterior extent of disease. RECOMMENDATION: Three right breast biopsies, as detailed above. Ordering physician will be contacted with today's results and patient will then be scheduled for biopsies at her earliest convenience. I have discussed the findings and recommendations with the patient. Results were also provided in writing at the conclusion of the visit. If applicable, a reminder letter will be sent to the patient regarding the next appointment. BI-RADS CATEGORY  5: Highly suggestive of malignancy. Electronically Signed: By: Franki Cabot M.D. On: 10/05/2017 11:32   Mm Diag Breast Tomo Bilateral  Addendum Date: 10/05/2017   ADDENDUM REPORT: 10/05/2017 14:07 ADDENDUM: Right axilla was evaluated with ultrasound showing no enlarged or morphologically abnormal lymph nodes. Electronically Signed   By: Franki Cabot M.D.   On: 10/05/2017 14:07   Result Date: 10/05/2017 CLINICAL DATA:  36 year old female with palpable right breast mass. EXAM: 2D DIGITAL DIAGNOSTIC BILATERAL MAMMOGRAM WITH CAD AND ADJUNCT TOMO ULTRASOUND RIGHT BREAST COMPARISON:  None. ACR Breast Density Category c: The breast tissue is heterogeneously dense, which may obscure small masses. FINDINGS: Bilateral 2D CC and MLO views were obtained today,  with additional 3D tomosynthesis, and with additional spot compression view of the upper right breast corresponding to the area of clinical concern. There is an irregular mass with architectural distortion within the upper right breast, at posterior depth, measuring approximately 2 cm greatest dimension, with associated architectural distortion and pleomorphic microcalcifications. Additional grouped pleomorphic and fine linear branching calcifications are identified within the upper right breast, 12 o'clock axis region, measuring 1.4 cm extent. Additional suspicious pleomorphic calcifications are identified within the upper retroareolar right breast extending to the subareolar right breast, spanning 3.7 cm as measured on the true lateral view. Mammographic images were processed with CAD. On physical exam, firm palpable mass is identified within the upper right breast. Targeted ultrasound is performed, showing an irregular hypoechoic mass in the right breast at the 11:30 o'clock axis, 6 cm from the nipple, measuring  1.2 x 1.1 x 0.7 cm, with associated microcalcifications, a likely correlate for the 1.4 cm extent of pleomorphic and fine linear calcifications seen in the upper right breast on mammogram. Additional vague hypoechoic masslike area is identified within the right breast at the 11:30 o'clock axis, 5 cm from the nipple, at posterior depth, measuring 2.6 x 2.4 x 1.5 cm, a likely correlate for the dominant mass seen on mammogram, better seen by mammogram. IMPRESSION: 1. Highly suspicious irregular mass with architectural distortion in the upper RIGHT breast, at POSTERIOR depth, measuring approximately 2 cm greatest dimension with associated architectural distortion and pleomorphic microcalcifications. On ultrasound, a vague hypoechoic masslike area is seen in the right breast at the 11:30 o'clock axis, 5 cm from the nipple, measuring 2.6 cm, a likely correlate for the dominant mass seen on mammogram, but better  seen on mammogram. As such, stereotactic biopsy with 3D tomosynthesis guidance is recommended for this dominant mass. 2. Irregular hypoechoic mass in the RIGHT breast at the 11:30 o'clock axis, 6 cm from the nipple, at SUPERFICIAL depth, measuring 1.2 cm, a likely correlate for the highly suspicious 1.4 cm extent of pleomorphic and fine linear branching calcifications identified in the upper right breast on mammogram. Recommend ultrasound-guided biopsy to define upper extent of disease. This could be performed as an additional stereotactic biopsy at the discretion of the performing radiologist. 3. Additional highly suspicious pleomorphic calcifications within the upper retroareolar right breast extending to the SUBAREOLAR right breast, measuring 3.7 cm extent. Recommend a third same day biopsy of the most anterior calcifications, using stereotactic guidance, to define anterior extent of disease. RECOMMENDATION: Three right breast biopsies, as detailed above. Ordering physician will be contacted with today's results and patient will then be scheduled for biopsies at her earliest convenience. I have discussed the findings and recommendations with the patient. Results were also provided in writing at the conclusion of the visit. If applicable, a reminder letter will be sent to the patient regarding the next appointment. BI-RADS CATEGORY  5: Highly suggestive of malignancy. Electronically Signed: By: Franki Cabot M.D. On: 10/05/2017 11:32   Mm Clip Placement Right  Result Date: 10/11/2017 CLINICAL DATA:  Post stereotactic core needle biopsy of 3 sites in the right breast. EXAM: DIAGNOSTIC RIGHT MAMMOGRAM POST STEREOTACTIC BIOPSY COMPARISON:  Previous exam(s). FINDINGS: Mammographic images were obtained following stereotactic guided biopsy of right breast. Two-view mammography demonstrates presence of 3 tissue markers is expected: 1. Coil shaped marker at site 1, 12:30 o'clock. 2. Ribbon shaped marker has site 2,  11:30 o'clock. 3. X shaped marker, at site 3, 5:30 o'clock IMPRESSION: Successful placement of tissue markers post 3 stereotactic core needle biopsies of the right breast. Final Assessment: Post Procedure Mammograms for Marker Placement Electronically Signed   By: Fidela Salisbury M.D.   On: 10/11/2017 09:57   Mm Rt Breast Bx W Loc Dev 1st Lesion Image Bx Spec Stereo Guide  Addendum Date: 10/13/2017   ADDENDUM REPORT: 10/13/2017 12:36 ADDENDUM: The pathology revealed DCIS at the right breast 11:30 o'clock, invasive ductal carcinoma at the right breast 5:30 o'clock, invasive mammary carcinoma at the right breast 12:30 o'clock. These are foung to be concordant with imaging findings. I discussed the results over the phone with the patient and answered her questions. The patient states she is doing well post biopsy without complications. The patient will be referred to the nurse navigator for scheduling of an appointment for surgical consultation. Electronically Signed   By: Mallie Darting.D.  On: 10/13/2017 12:36   Result Date: 10/13/2017 CLINICAL DATA:  Right breast palpable mass and suspicious microcalcifications. EXAM: RIGHT BREAST STEREOTACTIC CORE NEEDLE BIOPSY COMPARISON:  Previous exams. FINDINGS: The patient and I discussed the procedure of stereotactic-guided biopsy including benefits and alternatives. We discussed the high likelihood of a successful procedure. We discussed the risks of the procedure including infection, bleeding, tissue injury, clip migration, and inadequate sampling. Informed written consent was given. The usual time out protocol was performed immediately prior to the procedure. Using sterile technique and 1% Lidocaine as local anesthetic, under stereotactic guidance, a 9 gauge vacuum assisted device was used to perform core needle biopsy of mass/calcifications in the right 12:30 o'clock breast using a superior approach. Specimen radiograph was performed showing the presence of  calcifications. Specimens with calcifications are identified for pathology. Lesion quadrant: Upper inner quadrant. At the conclusion of the procedure, a coil shaped tissue marker clip was deployed into the biopsy cavity. Next, using sterile technique and 1% Lidocaine as local anesthetic, under stereotactic guidance, a 9 gauge vacuum assisted device was used to perform core needle biopsy of calcifications in the right 11:30 o'clock breast using a superior approach. Specimen radiograph was performed showing the presence of calcifications. Specimens with calcifications are identified for pathology. Lesion quadrant: Upper outer quadrant. At the conclusion of the procedure, a ribbon shaped tissue marker clip was deployed into the biopsy cavity. Next, using sterile technique and 1% Lidocaine as local anesthetic, under stereotactic guidance, a 9 gauge vacuum assisted device was used to perform core needle biopsy of calcifications in the right 5:30 o'clock breast using a superior approach. Specimen radiograph was performed showing the presence of calcifications. Specimens with calcifications are identified for pathology. Lesion quadrant: Lower inner quadrant At the conclusion of the procedure, a X shaped tissue marker clip was deployed into the biopsy cavity. Follow-up 2-view mammogram was performed and dictated separately. IMPRESSION: Stereotactic-guided biopsy of 3 sites in the right breast. No apparent complications. Electronically Signed: By: Fidela Salisbury M.D. 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 These are foung to be concordant with imaging findings. I discussed the results over the phone with the patient  and answered her questions. The patient states she is doing well post biopsy without complications. The patient will be referred to the nurse navigator for scheduling of an appointment for surgical consultation. Electronically Signed   By: Abelardo Diesel M.D.   On: 10/13/2017 12:36   Result Date: 10/13/2017 CLINICAL DATA:  Right breast palpable mass and suspicious microcalcifications. EXAM: RIGHT BREAST STEREOTACTIC CORE NEEDLE BIOPSY COMPARISON:  Previous exams. FINDINGS: The patient and I discussed the procedure of stereotactic-guided biopsy including benefits and alternatives. We discussed the high likelihood of a successful procedure. We discussed the risks of the procedure including infection, bleeding, tissue injury, clip migration, and inadequate sampling. Informed written consent was given. The usual time out protocol was performed immediately prior to the procedure. Using sterile technique and 1% Lidocaine as local anesthetic, under stereotactic guidance, a 9 gauge vacuum assisted device was used to perform core needle biopsy of mass/calcifications in the right 12:30 o'clock breast using a superior approach. Specimen radiograph was performed showing the presence of  calcifications. Specimens with calcifications are identified for pathology. Lesion quadrant: Upper inner quadrant. At the conclusion of the procedure, a coil shaped tissue marker clip was deployed into the biopsy cavity. Next, using sterile technique and 1% Lidocaine as local anesthetic, under stereotactic guidance, a 9 gauge vacuum assisted device was used to perform core needle biopsy of calcifications in the right 11:30 o'clock breast using a superior approach. Specimen radiograph was performed showing the presence of calcifications. Specimens with calcifications are identified for pathology. Lesion quadrant: Upper outer quadrant. At the conclusion of the procedure, a ribbon shaped tissue marker clip was deployed into the biopsy cavity. Next,  using sterile technique and 1% Lidocaine as local anesthetic, under stereotactic guidance, a 9 gauge vacuum assisted device was used to perform core needle biopsy of calcifications in the right 5:30 o'clock breast using a superior approach. Specimen radiograph was performed showing the presence of calcifications. Specimens with calcifications are identified for pathology. Lesion quadrant: Lower inner quadrant At the conclusion of the procedure, a X shaped tissue marker clip was deployed into the biopsy cavity. Follow-up 2-view mammogram was performed and dictated separately. IMPRESSION: Stereotactic-guided biopsy of 3 sites in the right breast. No apparent complications. Electronically Signed: By: Fidela Salisbury M.D. 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 These are foung to be concordant with imaging findings. I discussed the results over the phone with the patient and answered her questions. The patient states she is doing well post biopsy without complications. The patient will be referred to the nurse navigator for scheduling of an appointment for surgical consultation. Electronically Signed   By: Abelardo Diesel M.D.   On: 10/13/2017 12:36   Result Date: 10/13/2017 CLINICAL DATA:  Right breast palpable mass and suspicious microcalcifications. EXAM: RIGHT BREAST STEREOTACTIC CORE NEEDLE BIOPSY COMPARISON:  Previous exams. FINDINGS: The patient and I discussed the procedure of stereotactic-guided biopsy including benefits and alternatives. We discussed the high likelihood of a successful procedure. We discussed the risks of the procedure including infection, bleeding, tissue injury, clip migration, and  inadequate sampling. Informed written consent was given. The usual time out protocol was performed immediately prior to the procedure. Using sterile technique and 1% Lidocaine as local anesthetic, under stereotactic guidance, a 9 gauge vacuum assisted device was used to perform core needle biopsy of mass/calcifications in the right 12:30 o'clock breast using a superior approach. Specimen radiograph was performed showing the presence of calcifications. Specimens with calcifications are identified for pathology. Lesion quadrant: Upper inner quadrant. At the conclusion of the procedure, a coil shaped tissue marker clip was deployed into the biopsy cavity. Next, using sterile technique and 1% Lidocaine as local anesthetic, under stereotactic guidance, a 9 gauge vacuum assisted device was used to perform core needle biopsy of calcifications in the right 11:30 o'clock breast using a superior approach. Specimen radiograph was performed showing the presence of calcifications. Specimens with calcifications are identified for pathology. Lesion quadrant: Upper outer quadrant. At the conclusion of the procedure, a ribbon shaped tissue marker clip was deployed into the biopsy cavity. Next, using sterile technique and 1% Lidocaine as local anesthetic, under stereotactic guidance, a 9 gauge vacuum assisted device was used to perform core  needle biopsy of calcifications in the right 5:30 o'clock breast using a superior approach. Specimen radiograph was performed showing the presence of calcifications. Specimens with calcifications are identified for pathology. Lesion quadrant: Lower inner quadrant At the conclusion of the procedure, a X shaped tissue marker clip was deployed into the biopsy cavity. Follow-up 2-view mammogram was performed and dictated separately. IMPRESSION: Stereotactic-guided biopsy of 3 sites in the right breast. No apparent complications. Electronically Signed: By: Fidela Salisbury M.D. On: 10/11/2017 09:54      Assessment and plan- Patient is a 36 y.o. female presents for evaluation of newly diagnosed multicentric right breast cancer.  cT2 cN0 cM0.   1. Malignant neoplasm of female breast, unspecified estrogen receptor status, unspecified laterality, unspecified site of breast ([place])   2. Estrogen receptor positive status (ER+)   3. Goals of care, counseling/discussion   4. Tobacco abuse counseling    #Discussed the with patient about image results and pathology results. This is a young patient was multicentric breast cancer, high risk, and I recommend neo adjuvant chemotherapy with ddAC-T followed by mastectomy. Her case was discussed on breast tumor board 11/12 with consensus of new adjuvant chemotherapy. # I explained to the patient the risks and benefits of chemotherapy including all but not limited to hair loss, mouth sore, nausea, vomiting, low blood counts, bleeding, and risk of life threatening infection and even death, secondary malignancy, leukemia etc.  Risk of neuropathy is associated with Taxol. Options of cold Cap reduce chance of alopecia was discussed with patient and she is not interested. # I discussed with her regarding complete staging with CT chest/abdomen pelvis prior to chemotherapy. Due to financial reasons patient preferred to defer after she obtain new insurance in the coming year. Patient has appointment of chemotherapy class tomorrow. She already had a port placed.  Antiemetics-Zofran and Compazine; EMLA cream sent to pharmacy # # Smoking cessation was discussed with patient and cessation program information was given to patient.  #Possibility of infertility after chemotherapy was discussed the patient and she tells me that she does not desire any future fertility. She was on depo provera shot. and advice her to stop and she plans to see her GYN to discuss about tubal ligation.  All questions answered. Follow up on cycle 1 day 1 dose dense AC, check cbc cmp and Urine  Hcg Thank you for this kind referral and the opportunity to participate in the care of this patient  Dr. Earlie Server, MD, PhD [facility] at [facility] Pager- [phone] 10/30/2017

## 2017-10-30 NOTE — Telephone Encounter (Signed)
Cancer Genetics            Telegenetics Initial Visit    Patient Name: Theresa Chaney Patient DOB: 08-Mar-1981 Patient Age: 36 y.o. Phone Call Date: 10/30/2017  Referring Provider: Earlie Server, MD  Reason for Visit: Evaluate for hereditary susceptibility to cancer    Assessment and Plan:  . Ms. Theresa Chaney's family history is not suggestive of a hereditary predisposition to cancer. However, Ms. Theresa Chaney' diagnosis of breast cancer at age 21 warrants a genetics evaluation. She meets NCCN criteria for genetic testing.  . Testing is recommended to determine whether she has a pathogenic mutation that will impact her screening and risk-reduction for cancer. A negative result will be reassuring.  . Ms. Theresa Chaney wished to pursue genetic testing, but was concerned about costs to her. Her insurance will be contacted and she will be called regarding the cost to her (if any) of testing. She will then be scheduled for a lab visit. Analysis will include the 47 genes on [facility]'s Common Cancers panel (APC, ATM, AXIN2, BARD1, BMPR1A, BRCA1, BRCA2, BRIP1, CDH1, CDK4, CDKN2A, CHEK2, CTNNA1, DICER1, EPCAM, GREM1, HOXB13, KIT, MEN1, MLH1, MSH2, MSH3, MSH6, MUTYH, NBN, NF1, NTHL1, PALB2, PDGFRA, PMS2, POLD1, POLE, PTEN, RAD50, RAD51C, RAD51D, SDHA, SDHB, SDHC, SDHD, SMAD4, SMARCA4, STK11, TP53, TSC1, TSC2, VHL).   . Once testing starts, results should be available in approximately 2-3 weeks, at which point we will contact her and address implications for her as well as address genetic testing for at-risk family members, if needed.      Dr. Grayland Ormond was available for questions concerning this case. Total time spent by counseling by phone was approximately 25 minutes.   _____________________________________________________________________   History of Present Illness: Ms. Theresa Chaney, a 36 y.o. female, was referred for genetic counseling to discuss the possibility of a hereditary predisposition to cancer  and discuss whether genetic testing is warranted. This was a telegenetics visit via phone.  Ms. Theresa Chaney was recently diagnosed with multicentric right breast cancer at the age of 37. She is starting neoadjuvant chemotherapy.    Past Medical History:  Diagnosis Date  . Depression   . Family history of cancer   . GERD (gastroesophageal reflux disease)     Past Surgical History:  Procedure Laterality Date  . BREAST BIOPSY Right 10/11/2017   12:00 posterior coil clip  . BREAST BIOPSY Right 10/11/2017   11:30 middle depth ribbon clip  . BREAST BIOPSY Right 10/11/2017   5:30 anterior depth x shape  . INSERTION PORT-A-CATH Left 10/24/2017   Performed by Robert Bellow, MD at [facility] ORS    Family History: Significant diagnoses include the following:  Family History  Problem Relation Age of Onset  . Melanoma Maternal Aunt        other aunts with BCC/SCC/Melanoma  . Diabetes Father   . Hypertension Father   . Hyperlipidemia Father   . Bladder Cancer Maternal Grandmother   . Cervical cancer Maternal Aunt 64       daughter w/ cervical cancer as well  . Melanoma Maternal Uncle        other uncles with BCC/SCC/Melanoma    Additionally, Ms. Theresa Chaney has one biological daughter (age 54) and one step-daughter. She has a total of 8 brothers and 2 sisters, but 3 of them are paternal half-siblings. Her mother left the family when Ms. Theresa Chaney was 36 years-old, but Ms. Theresa Chaney indicated that her mother had a total  of 8 brothers and 5 sisters. Her father (age 38) had a brother and 2 sisters.  Ms. Theresa Chaney ancestry is Caucasian - NOS. There is no known Jewish ancestry and no consanguinity.  Discussion: We reviewed the characteristics, features and inheritance patterns of hereditary cancer syndromes. We discussed her risk of harboring a mutation in the context of her personal and family history. We discussed that her somewhat unknown maternal family makes risk assessment challenging. We discussed the  process of genetic testing, insurance coverage and implications of results: positive, negative and variant of unknown significance (VUS).   Ms. Theresa Chaney questions were answered to her satisfaction today and she is welcome to call with any additional questions or concerns. Thank you for the referral and allowing [place] to share in the care of your patient.    Steele Berg, MS, [place] Certified Genetic Counselor phone: [phone]

## 2017-10-30 NOTE — Telephone Encounter (Signed)
Switch to Phenergan. Ordered. Thanks

## 2017-10-30 NOTE — Progress Notes (Signed)
START ON PATHWAY REGIMEN - Breast   Doxorubicin + Cyclophosphamide (AC):   A cycle is every 21 days:     Doxorubicin      Cyclophosphamide   **Always confirm dose/schedule in your pharmacy ordering system**    Paclitaxel 80 mg/m2 Weekly:   Administer weekly:     Paclitaxel   **Always confirm dose/schedule in your pharmacy ordering system**    Patient Characteristics: Preoperative or Nonsurgical Candidate (Clinical Staging), Neoadjuvant Therapy followed by Surgery, Invasive Disease, Chemotherapy, HER2 Negative/Unknown/Equivocal, ER Positive Therapeutic Status: Preoperative or Nonsurgical Candidate (Clinical Staging) AJCC M Category: cM0 AJCC Grade: G2 Breast Surgical Plan: Neoadjuvant Therapy followed by Surgery ER Status: Positive (+) AJCC 8 Stage Grouping: IB HER2 Status: Negative (-) AJCC T Category: cT2 AJCC N Category: cN0 PR Status: Positive (+) Intent of Therapy: Curative Intent, Discussed with Patient

## 2017-10-30 NOTE — Telephone Encounter (Signed)
Pharmacist calling due to interaction between Ondansetron and Escitalopram Prolonged QT. Please advise if ok to fill med

## 2017-10-30 NOTE — Progress Notes (Signed)
Patient here today for follow up.  Patient states no new concerns today  

## 2017-10-30 NOTE — Telephone Encounter (Signed)
Pharmacist informed. 

## 2017-10-31 ENCOUNTER — Inpatient Hospital Stay: Payer: BLUE CROSS/BLUE SHIELD

## 2017-11-07 ENCOUNTER — Ambulatory Visit: Payer: BLUE CROSS/BLUE SHIELD

## 2017-11-07 NOTE — Progress Notes (Addendum)
Hematology/Oncology Follow Up Note [facility] Telephone:([phone] [phone]  Patient Care Team: Juluis Pitch, MD as PCP - General (Family Medicine)   Name of the patient: Theresa Chaney  [id number]  Mar 18, 1981   Date of visit: 11/08/17 REASON FOR [place]:  Newly diagnosed breast cancer  History of presenting illness 36 year old female with past medical history listed as below presented for evaluation of newly diagnosed right breast cancer. Patient self palpated a right breast mass 2 months ago during bathing. This triggered workup including mammogram and ultrasound. 10/05/2017 mammogram diagnostic breast tomo bilaterally showed multicentric area had a suspicion of breast cancer. 1  highly suspicious irregular mass with architectural distortion in the upper right breast at posterior depth measuring approximately 2 cm greatest dimension with associated architectural distortion and pleomorphic micro-calcifications. On ultrasound of vague hypoechoic masslike areas seen in the right breast at 11:30 o'clock axis. 2 second areas irregular hypoechoic mass in the right breast at the 11:30 o'clock axis, 6 cm from the nipple, at the superficial depth measuring 1.2 cm, likely correlate for a highly suspicious 1.4 cm extent of pleomorphic and a fine linear branching calcifications. 3 additional highly suspicious pleomorphic calcifications within the upper retroareolar right breast extending to this subareolar right breast, measuring 3.7 cm extending.  Right axilla was evaluated with ultrasound showing no enlarged or morphologically abnormal lymph nodes.  Patient underwent stereotactic biopsy, on 10/11/2017. Pathology showed A, carcinoma in situ intermediate to high grade with necrosis and calcifications at the right breast 11:30. B, right breast 5:30 subareolar, invasive carcinoma with diffusely infiltrative pattern in the lymphovascular invasion. In a background  of intermediate grade carcinoma in situ with necrosis and calcifications. C right breast 12:30, posterior common invasive mammary carcinoma of her type, associated with high-grade DCIS with necrosis and calcifications. Right breast 5:30 subareolar, ER +75%, PR negative HER-2 negative. Right breast 12:30 posterior, and ER +90%, and PR negative, and HER-2 negative.  Her aunt has a history of melanoma. . She is married, and she has a Psychiatrist and a biological daughter who is 57 years old. . She reports that she has a history of heart murmur which was diagnosed by primary care physician. She's never had any other health problems.   INTERVAL HISTORY Patient presents for evaluation prior to cycle 1 neoadjuvant chemotherapy for breast cancer treatment. She had a MediPort placed. She denies any back pain, headache, chest pain, shortness of breath, abdominal pain. She reports previously healthy other than taking some mood disorder medication.  She is on depo shot for contraceptive measures and was advised to stop. She is currently sexually active and uses condom. She has not been evaluated by GYN yet to discuss alternative ways of contraceptive. She denies any possibility of being pregnant today. Urine hCG test is negative.     Review of Systems  Constitutional: Negative for chills and weight loss.  HENT: Negative for hearing loss and tinnitus.   Eyes: Negative for blurred vision and double vision.  Respiratory: Negative for cough.   Cardiovascular: Negative for chest pain.  Gastrointestinal: Negative for heartburn and nausea.  Genitourinary: Negative for dysuria.  Musculoskeletal: Negative for myalgias.  Neurological: Negative for dizziness and headaches.  Endo/Heme/Allergies: Negative for environmental allergies. Does not bruise/bleed easily.  Psychiatric/Behavioral: Negative for depression. The patient is not nervous/anxious.     No Known Allergies  Patient Active Problem List   Diagnosis  Date Noted  . Family history of cancer   . Malignant neoplasm of  overlapping sites of right breast in female, estrogen receptor positive ([place]) 10/19/2017     Past Medical History:  Diagnosis Date  . Depression   . Family history of cancer   . GERD (gastroesophageal reflux disease)      Past Surgical History:  Procedure Laterality Date  . BREAST BIOPSY Right 10/11/2017   12:00 posterior coil clip  . BREAST BIOPSY Right 10/11/2017   11:30 middle depth ribbon clip  . BREAST BIOPSY Right 10/11/2017   5:30 anterior depth x shape  . PORTACATH PLACEMENT Left 10/24/2017   Procedure: INSERTION PORT-A-CATH;  Surgeon: Robert Bellow, MD;  Location: [facility] ORS;  Service: General;  Laterality: Left;    Social History   Socioeconomic History  . Marital status: Married    Spouse name: Not on file  . Number of children: Not on file  . Years of education: Not on file  . Highest education level: Not on file  Social Needs  . Financial resource strain: Not on file  . Food insecurity - worry: Not on file  . Food insecurity - inability: Not on file  . Transportation needs - medical: Not on file  . Transportation needs - non-medical: Not on file  Occupational History  . Occupation: Occupational psychologist    Comment: Therapist, art   Tobacco Use  . Smoking status: Current Every Day Smoker    Packs/day: 1.00    Years: 5.00    Pack years: 5.00    Types: Cigarettes  . Smokeless tobacco: Never Used  Substance and Sexual Activity  . Alcohol use: No    Frequency: Never  . Drug use: No  . Sexual activity: Yes    Birth control/protection: Injection  Other Topics Concern  . Not on file  Social History Narrative  . Not on file     Family History  Problem Relation Age of Onset  . Melanoma Maternal Aunt        other aunts with BCC/SCC/Melanoma  . Diabetes Father   . Hypertension Father   . Hyperlipidemia Father   . Bladder Cancer Maternal Grandmother   . Cervical  cancer Maternal Aunt 64       daughter w/ cervical cancer as well  . Melanoma Maternal Uncle        other uncles with BCC/SCC/Melanoma     Current Outpatient Medications:  .  ALPRAZolam (XANAX) 0.25 MG tablet, Take 0.25 mg at bedtime as needed by mouth for anxiety or sleep. , Disp: , Rfl:  .  Calcium Carb-Cholecalciferol (CALCIUM 600 + D PO), Take 1 tablet daily by mouth., Disp: , Rfl:  .  escitalopram (LEXAPRO) 20 MG tablet, Take 20 mg daily by mouth., Disp: , Rfl:  .  esomeprazole (NEXIUM) 40 MG capsule, Take 40 mg daily by mouth., Disp: , Rfl:  .  ibuprofen (ADVIL,MOTRIN) 800 MG tablet, Take 800 mg every 8 (eight) hours as needed by mouth for moderate pain., Disp: , Rfl:  .  zolpidem (AMBIEN) 5 MG tablet, Take 5 mg at bedtime by mouth., Disp: , Rfl:    Physical exam:  Vitals:   11/08/17 0946  BP: 122/81  Pulse: 78  Resp: 14  Temp: 97.6 F (36.4 C)  Weight: 156 lb (70.8 kg)  ECOG 0 GENERAL: No distress, well nourished.  SKIN:  No rashes or significant lesions  HEAD: Normocephalic, No masses, lesions, tenderness or abnormalities  EYES: Conjunctiva are pink, non icteric ENT: External ears normal ,lips , buccal mucosa,  and tongue normal and mucous membranes are moist  LYMPH: No palpable cervical and axillary lymphadenopathy  LUNGS: Clear to auscultation, no crackles or wheezes HEART: Regular rate & rhythm, no murmurs, no gallops, S1 normal and S2 normal  ABDOMEN: Abdomen soft, non-tender, normal bowel sounds, I did not appreciate any  masses or organomegaly  MUSCULOSKELETAL: No CVA tenderness and no tenderness on percussion of the back or rib cage.  EXTREMITIES: No edema, no skin discoloration or tenderness NEURO: Alert & oriented, no focal motor/sensory deficits. Breasts: palpable right breasts mass at 11:30, no skin or nipple changes or axillary nodes. Left breast no palpable mass or lymph node.     CMP Latest Ref Rng & Units 10/19/2017  Glucose 65 - 99 mg/dL 98  BUN 6 -  20 mg/dL 12  Creatinine 0.44 - 1.00 mg/dL 0.69  Sodium 135 - 145 mmol/L 138  Potassium 3.5 - 5.1 mmol/L 3.4(L)  Chloride 101 - 111 mmol/L 104  CO2 22 - 32 mmol/L 24  Calcium 8.9 - 10.3 mg/dL 9.2  Total Protein 6.5 - 8.1 g/dL 8.6(H)  Total Bilirubin 0.3 - 1.2 mg/dL 0.5  Alkaline Phos 38 - 126 U/L 81  AST 15 - 41 U/L 19  ALT 14 - 54 U/L 11(L)   CBC Latest Ref Rng & Units 10/19/2017  WBC 3.6 - 11.0 K/uL 15.1(H)  Hemoglobin 12.0 - 16.0 g/dL 14.4  Hematocrit 35.0 - 47.0 % 41.9  Platelets 150 - 440 K/uL 303    Assessment and plan- Patient is a 36 y.o. female presents for evaluation of newly diagnosed multicentric right breast cancer.  cT2 cN0 cM0.   1. Malignant neoplasm of female breast, unspecified estrogen receptor status, unspecified laterality, unspecified site of breast ([place])   2. Estrogen receptor positive status (ER+)   3. Tobacco abuse counseling   4. Family history of cancer   5. Malignant neoplasm of overlapping sites of right breast in female, estrogen receptor positive ([place])   6. Encounter for antineoplastic chemotherapy    #ok to proceed with neuadjuvant cycle 1 ddAC with onpro.   I discussed with her regarding complete staging with CT chest/abdomen pelvis prior to chemotherapy. Due to financial reasons patient preferred to defer after she obtain new insurance in the coming year.   # intermittent headache, chronic, she uses ibuprofen 800 mg as needed. Declined MRI brain for work up.  I advise her minimizing the use of Ibuprofen especially during day 7-12 days of her chemotherapy cycle when she may have thrombocytopenia and ibuprofen can increase bleeding tendency.   Antiemetics-Phenergan and Compazine; EMLA cream sent to pharmacy. Peridex mouth rinse BID swish and spit.   #Possibility of infertility after chemotherapy was discussed the patient and she tells me that she does not desire any future fertility. She was on depo provera shot. and advice her to stop and she  plans to see her GYN to discuss about tubal ligation. Urine HCG every 2 weeks prior to chemotherapy until she has gotten good contraceptive measure.    All questions answered. Follow up on 1 week, check cbc cmp Thank you for this kind referral and the opportunity to participate in the care of this patient  Earlie Server, MD, PhD Hematology Oncology [facility] at [facility] Pager- [phone] 11/08/2017

## 2017-11-08 ENCOUNTER — Encounter: Payer: Self-pay | Admitting: Oncology

## 2017-11-08 ENCOUNTER — Inpatient Hospital Stay: Payer: BLUE CROSS/BLUE SHIELD

## 2017-11-08 ENCOUNTER — Inpatient Hospital Stay (HOSPITAL_BASED_OUTPATIENT_CLINIC_OR_DEPARTMENT_OTHER): Payer: BLUE CROSS/BLUE SHIELD | Admitting: Oncology

## 2017-11-08 VITALS — BP 122/81 | HR 78 | Temp 97.6°F | Resp 14 | Wt 156.0 lb

## 2017-11-08 DIAGNOSIS — F329 Major depressive disorder, single episode, unspecified: Secondary | ICD-10-CM | POA: Diagnosis not present

## 2017-11-08 DIAGNOSIS — Z79899 Other long term (current) drug therapy: Secondary | ICD-10-CM | POA: Diagnosis not present

## 2017-11-08 DIAGNOSIS — Z8052 Family history of malignant neoplasm of bladder: Secondary | ICD-10-CM | POA: Diagnosis not present

## 2017-11-08 DIAGNOSIS — Z809 Family history of malignant neoplasm, unspecified: Secondary | ICD-10-CM

## 2017-11-08 DIAGNOSIS — C50919 Malignant neoplasm of unspecified site of unspecified female breast: Secondary | ICD-10-CM

## 2017-11-08 DIAGNOSIS — Z793 Long term (current) use of hormonal contraceptives: Secondary | ICD-10-CM

## 2017-11-08 DIAGNOSIS — R51 Headache: Secondary | ICD-10-CM | POA: Diagnosis not present

## 2017-11-08 DIAGNOSIS — Z801 Family history of malignant neoplasm of trachea, bronchus and lung: Secondary | ICD-10-CM

## 2017-11-08 DIAGNOSIS — Z170 Estrogen receptor positive status [ER+]: Secondary | ICD-10-CM | POA: Diagnosis not present

## 2017-11-08 DIAGNOSIS — C50811 Malignant neoplasm of overlapping sites of right female breast: Secondary | ICD-10-CM

## 2017-11-08 DIAGNOSIS — Z85820 Personal history of malignant melanoma of skin: Secondary | ICD-10-CM

## 2017-11-08 DIAGNOSIS — Z5111 Encounter for antineoplastic chemotherapy: Secondary | ICD-10-CM

## 2017-11-08 DIAGNOSIS — F17210 Nicotine dependence, cigarettes, uncomplicated: Secondary | ICD-10-CM | POA: Diagnosis not present

## 2017-11-08 DIAGNOSIS — Z808 Family history of malignant neoplasm of other organs or systems: Secondary | ICD-10-CM

## 2017-11-08 DIAGNOSIS — Z716 Tobacco abuse counseling: Secondary | ICD-10-CM

## 2017-11-08 DIAGNOSIS — K219 Gastro-esophageal reflux disease without esophagitis: Secondary | ICD-10-CM | POA: Diagnosis not present

## 2017-11-08 DIAGNOSIS — R011 Cardiac murmur, unspecified: Secondary | ICD-10-CM | POA: Diagnosis not present

## 2017-11-08 DIAGNOSIS — Z3202 Encounter for pregnancy test, result negative: Secondary | ICD-10-CM | POA: Diagnosis not present

## 2017-11-08 LAB — CBC WITH DIFFERENTIAL/PLATELET
Basophils Absolute: 0.1 K/uL (ref 0–0.1)
Basophils Relative: 1 %
Eosinophils Absolute: 0.1 K/uL (ref 0–0.7)
Eosinophils Relative: 1 %
HCT: 39.4 % (ref 35.0–47.0)
Hemoglobin: 13.5 g/dL (ref 12.0–16.0)
Lymphocytes Relative: 28 %
Lymphs Abs: 2.7 K/uL (ref 1.0–3.6)
MCH: 29.9 pg (ref 26.0–34.0)
MCHC: 34.3 g/dL (ref 32.0–36.0)
MCV: 87.2 fL (ref 80.0–100.0)
Monocytes Absolute: 0.8 K/uL (ref 0.2–0.9)
Monocytes Relative: 8 %
Neutro Abs: 6.1 K/uL (ref 1.4–6.5)
Neutrophils Relative %: 62 %
Platelets: 291 K/uL (ref 150–440)
RBC: 4.52 MIL/uL (ref 3.80–5.20)
RDW: 13.1 % (ref 11.5–14.5)
WBC: 9.7 K/uL (ref 3.6–11.0)

## 2017-11-08 LAB — COMPREHENSIVE METABOLIC PANEL
ALBUMIN: 4.4 g/dL (ref 3.5–5.0)
ALT: 10 U/L — ABNORMAL LOW (ref 14–54)
ANION GAP: 8 (ref 5–15)
AST: 20 U/L (ref 15–41)
Alkaline Phosphatase: 63 U/L (ref 38–126)
BILIRUBIN TOTAL: 0.4 mg/dL (ref 0.3–1.2)
BUN: 14 mg/dL (ref 6–20)
CO2: 23 mmol/L (ref 22–32)
Calcium: 8.9 mg/dL (ref 8.9–10.3)
Chloride: 106 mmol/L (ref 101–111)
Creatinine, Ser: 0.71 mg/dL (ref 0.44–1.00)
GFR calc Af Amer: >60 mL/min (ref >60)
GFR calc non Af Amer: >60 mL/min (ref >60)
GLUCOSE: 104 mg/dL — AB (ref 65–99)
POTASSIUM: 3.7 mmol/L (ref 3.5–5.1)
Sodium: 137 mmol/L (ref 135–145)
TOTAL PROTEIN: 7.7 g/dL (ref 6.5–8.1)

## 2017-11-08 LAB — PREGNANCY, URINE: Preg Test, Ur: NEGATIVE

## 2017-11-08 MED ORDER — SODIUM CHLORIDE 0.9 % IV SOLN
Freq: Once | INTRAVENOUS | Status: AC
  Administered 2017-11-08: 11:00:00 via INTRAVENOUS
  Filled 2017-11-08: qty 1000

## 2017-11-08 MED ORDER — SODIUM CHLORIDE 0.9% FLUSH
10.0000 mL | INTRAVENOUS | Status: DC | PRN
  Administered 2017-11-08: 10 mL via INTRAVENOUS
  Filled 2017-11-08: qty 10

## 2017-11-08 MED ORDER — SODIUM CHLORIDE 0.9 % IV SOLN
600.0000 mg/m2 | Freq: Once | INTRAVENOUS | Status: AC
  Administered 2017-11-08: 1100 mg via INTRAVENOUS
  Filled 2017-11-08: qty 5

## 2017-11-08 MED ORDER — PROMETHAZINE HCL 25 MG PO TABS: 25.0000 mg | ORAL_TABLET | Freq: Four times a day (QID) | ORAL | 0 refills | Status: DC | PRN

## 2017-11-08 MED ORDER — SODIUM CHLORIDE 0.9 % IV SOLN
Freq: Once | INTRAVENOUS | Status: AC
  Administered 2017-11-08: 12:00:00 via INTRAVENOUS
  Filled 2017-11-08: qty 5

## 2017-11-08 MED ORDER — DOXORUBICIN HCL CHEMO IV INJECTION 2 MG/ML
60.0000 mg/m2 | Freq: Once | INTRAVENOUS | Status: AC
  Administered 2017-11-08: 110 mg via INTRAVENOUS
  Filled 2017-11-08: qty 55

## 2017-11-08 MED ORDER — PEGFILGRASTIM 6 MG/0.6ML ~~LOC~~ PSKT
6.0000 mg | PREFILLED_SYRINGE | Freq: Once | SUBCUTANEOUS | Status: AC
Start: 2017-11-08 — End: 2017-11-08
  Administered 2017-11-08: 6 mg via SUBCUTANEOUS
  Filled 2017-11-08: qty 0.6

## 2017-11-08 MED ORDER — PROCHLORPERAZINE MALEATE 10 MG PO TABS: 10.0000 mg | ORAL_TABLET | Freq: Four times a day (QID) | ORAL | 1 refills | Status: DC | PRN

## 2017-11-08 MED ORDER — PALONOSETRON HCL INJECTION 0.25 MG/5ML
0.2500 mg | Freq: Once | INTRAVENOUS | Status: AC
  Administered 2017-11-08: 0.25 mg via INTRAVENOUS
  Filled 2017-11-08: qty 5

## 2017-11-08 MED ORDER — CHLORHEXIDINE GLUCONATE 0.12 % MT SOLN: 15.0000 mL | Freq: Two times a day (BID) | OROMUCOSAL | 0 refills | Status: DC

## 2017-11-08 MED ORDER — HEPARIN SOD (PORK) LOCK FLUSH 100 UNIT/ML IV SOLN
500.0000 [IU] | Freq: Once | INTRAVENOUS | Status: AC
  Administered 2017-11-08: 500 [IU] via INTRAVENOUS
  Filled 2017-11-08: qty 5

## 2017-11-08 MED ORDER — LIDOCAINE-PRILOCAINE 2.5-2.5 % EX CREA: TOPICAL_CREAM | CUTANEOUS | 3 refills | Status: DC

## 2017-11-08 NOTE — Progress Notes (Signed)
Patient here for follow up with labs and initial treatment today with [facility]. She states that she is feeling well and denies having any pain.

## 2017-11-09 NOTE — Telephone Encounter (Signed)
Per [employer], the patient's cost for testing is $0 because she has met both her deductible and out-of-pocket max. Theresa Chaney was notified and will have her blood drawn for genetic testing on 11/15/17 when she comes in for a lab appointment and to see Dr. Tasia Catchings.

## 2017-11-11 DIAGNOSIS — J42 Unspecified chronic bronchitis: Secondary | ICD-10-CM

## 2017-11-11 HISTORY — DX: Unspecified chronic bronchitis: J42

## 2017-11-13 NOTE — Progress Notes (Signed)
Hematology/Oncology Follow Up Note [facility] Telephone:([phone] [phone]  Patient Care Team: Juluis Pitch, MD as PCP - General (Family Medicine)   Name of the patient: Theresa Chaney  [id number]  1981-12-06   Date of visit: 11/13/17 REASON FOR COSULTATION:  Newly diagnosed breast cancer  History of presenting illness 36 year old female with past medical history listed as below presented for evaluation of newly diagnosed right breast cancer. Patient self palpated a right breast mass 2 months ago during bathing. This triggered workup including mammogram and ultrasound. 10/05/2017 mammogram diagnostic breast tomo bilaterally showed multicentric area had a suspicion of breast cancer. 1  highly suspicious irregular mass with architectural distortion in the upper right breast at posterior depth measuring approximately 2 cm greatest dimension with associated architectural distortion and pleomorphic micro-calcifications. On ultrasound of vague hypoechoic masslike areas seen in the right breast at 11:30 o'clock axis. 2 second areas irregular hypoechoic mass in the right breast at the 11:30 o'clock axis, 6 cm from the nipple, at the superficial depth measuring 1.2 cm, likely correlate for a highly suspicious 1.4 cm extent of pleomorphic and a fine linear branching calcifications. 3 additional highly suspicious pleomorphic calcifications within the upper retroareolar right breast extending to this subareolar right breast, measuring 3.7 cm extending.  Right axilla was evaluated with ultrasound showing no enlarged or morphologically abnormal lymph nodes.  Patient underwent stereotactic biopsy, on 10/11/2017. Pathology showed A, carcinoma in situ intermediate to high grade with necrosis and calcifications at the right breast 11:30. B, right breast 5:30 subareolar, invasive carcinoma with diffusely infiltrative pattern in the lymphovascular invasion. In a background  of intermediate grade carcinoma in situ with necrosis and calcifications. C right breast 12:30, posterior common invasive mammary carcinoma of her type, associated with high-grade DCIS with necrosis and calcifications. Right breast 5:30 subareolar, ER +75%, PR negative HER-2 negative. Right breast 12:30 posterior, and ER +90%, and PR negative, and HER-2 negative.  Her aunt has a history of melanoma. . She is married, and she has a Psychiatrist and a biological daughter who is 64 years old. . She reports that she has a history of heart murmur which was diagnosed by primary care physician. She's never had any other health problems.   INTERVAL HISTORY Patient presents for evaluation of toxicity after cycle 1 neoadjuvant chemotherapy for breast cancer treatment. She reports feeling well, denies any fever or chills, nausea vomiting. Denies any mouth sore, diarrhea.    Review of Systems  Constitutional: Negative for chills, fever and weight loss.  HENT: Negative for ear pain and hearing loss.   Eyes: Negative for blurred vision and photophobia.  Respiratory: Negative for cough and hemoptysis.   Cardiovascular: Negative for palpitations and leg swelling.  Gastrointestinal: Negative for heartburn, nausea and vomiting.  Genitourinary: Negative for dysuria and urgency.  Musculoskeletal: Negative for myalgias and neck pain.  Skin: Negative for rash.  Neurological: Negative for dizziness and tingling.  Endo/Heme/Allergies: Negative for polydipsia. Does not bruise/bleed easily.  Psychiatric/Behavioral: Negative for depression and substance abuse. The patient is not nervous/anxious.     No Known Allergies  Patient Active Problem List   Diagnosis Date Noted  . Family history of cancer   . Malignant neoplasm of overlapping sites of right breast in female, estrogen receptor positive ([place]) 10/19/2017     Past Medical History:  Diagnosis Date  . Breast cancer ([place])   . Depression   . Family  history of cancer   . GERD (gastroesophageal reflux  disease)      Past Surgical History:  Procedure Laterality Date  . BREAST BIOPSY Right 10/11/2017   12:00 posterior coil clip  . BREAST BIOPSY Right 10/11/2017   11:30 middle depth ribbon clip  . BREAST BIOPSY Right 10/11/2017   5:30 anterior depth x shape  . PORTACATH PLACEMENT Left 10/24/2017   Procedure: INSERTION PORT-A-CATH;  Surgeon: Robert Bellow, MD;  Location: [facility] ORS;  Service: General;  Laterality: Left;    Social History   Socioeconomic History  . Marital status: Married    Spouse name: Not on file  . Number of children: Not on file  . Years of education: Not on file  . Highest education level: Not on file  Social Needs  . Financial resource strain: Not on file  . Food insecurity - worry: Not on file  . Food insecurity - inability: Not on file  . Transportation needs - medical: Not on file  . Transportation needs - non-medical: Not on file  Occupational History  . Occupation: Occupational psychologist    Comment: Therapist, art   Tobacco Use  . Smoking status: Current Some Day Smoker    Packs/day: 1.00    Years: 5.00    Pack years: 5.00    Types: Cigarettes  . Smokeless tobacco: Never Used  Substance and Sexual Activity  . Alcohol use: No    Frequency: Never  . Drug use: No  . Sexual activity: Yes    Birth control/protection: Injection  Other Topics Concern  . Not on file  Social History Narrative  . Not on file     Family History  Problem Relation Age of Onset  . Melanoma Maternal Aunt        other aunts with BCC/SCC/Melanoma  . Diabetes Father   . Hypertension Father   . Hyperlipidemia Father   . Bladder Cancer Maternal Grandmother   . Cervical cancer Maternal Aunt 64       daughter w/ cervical cancer as well  . Melanoma Maternal Uncle        other uncles with BCC/SCC/Melanoma     Current Outpatient Medications:  .  ALPRAZolam (XANAX) 0.25 MG tablet, Take 0.25 mg at  bedtime as needed by mouth for anxiety or sleep. , Disp: , Rfl:  .  Calcium Carb-Cholecalciferol (CALCIUM 600 + D PO), Take 1 tablet daily by mouth., Disp: , Rfl:  .  chlorhexidine (PERIDEX) 0.12 % solution, Use as directed 15 mLs in the mouth or throat 2 (two) times daily., Disp: 473 mL, Rfl: 0 .  escitalopram (LEXAPRO) 20 MG tablet, Take 20 mg daily by mouth., Disp: , Rfl:  .  esomeprazole (NEXIUM) 40 MG capsule, Take 40 mg daily by mouth., Disp: , Rfl:  .  ibuprofen (ADVIL,MOTRIN) 800 MG tablet, Take 800 mg every 8 (eight) hours as needed by mouth for moderate pain., Disp: , Rfl:  .  lidocaine-prilocaine (EMLA) cream, Apply to affected area once, Disp: 30 g, Rfl: 3 .  Loratadine (CLARITIN) 10 MG CAPS, Take by mouth., Disp: , Rfl:  .  zolpidem (AMBIEN) 5 MG tablet, Take 5 mg at bedtime by mouth., Disp: , Rfl:  .  prochlorperazine (COMPAZINE) 10 MG tablet, Take 1 tablet (10 mg total) by mouth every 6 (six) hours as needed (Nausea or vomiting). (Patient not taking: Reported on 11/08/2017), Disp: 30 tablet, Rfl: 1 .  promethazine (PHENERGAN) 25 MG tablet, Take 1 tablet (25 mg total) by mouth every 6 (six) hours  as needed for nausea or vomiting. (Patient not taking: Reported on 11/08/2017), Disp: 30 tablet, Rfl: 0   Physical exam:  Vitals:   11/15/17 0936  BP: 120/70  Pulse: 84  Resp: 14  Temp: 98.2 F (36.8 C)  TempSrc: Tympanic  Weight: 155 lb (70.3 kg)  ECOG 0 Physical Exam  Constitutional: She is oriented to person, place, and time and well-developed, well-nourished, and in no distress. No distress.  HENT:  Head: Normocephalic and atraumatic.  Mouth/Throat: No oropharyngeal exudate.  No thrush  Eyes: Conjunctivae and EOM are normal. No scleral icterus.  Neck: Normal range of motion. Neck supple.  Cardiovascular: Normal rate and regular rhythm.  Pulmonary/Chest: Effort normal and breath sounds normal. No respiratory distress.  Abdominal: Soft. Bowel sounds are normal. She exhibits  no distension.  Neurological: She is alert and oriented to person, place, and time. Gait normal.  Skin: Skin is dry.  Psychiatric: Affect normal.  NEURO: Alert & oriented, no focal motor/sensory deficits. Breasts: palpable right breasts mass at 11:30, no skin or nipple changes or axillary nodes. Left breast no palpable mass or lymph node.     CMP Latest Ref Rng & Units 11/15/2017  Glucose 65 - 99 mg/dL 93  BUN 6 - 20 mg/dL 14  Creatinine 0.44 - 1.00 mg/dL 0.62  Sodium 135 - 145 mmol/L 134(L)  Potassium 3.5 - 5.1 mmol/L 3.7  Chloride 101 - 111 mmol/L 103  CO2 22 - 32 mmol/L 25  Calcium 8.9 - 10.3 mg/dL 9.2  Total Protein 6.5 - 8.1 g/dL 7.5  Total Bilirubin 0.3 - 1.2 mg/dL 0.5  Alkaline Phos 38 - 126 U/L 104  AST 15 - 41 U/L 15  ALT 14 - 54 U/L 11(L)   CBC Latest Ref Rng & Units 11/15/2017  WBC 3.6 - 11.0 K/uL 2.0(L)  Hemoglobin 12.0 - 16.0 g/dL 12.2  Hematocrit 35.0 - 47.0 % 35.5  Platelets 150 - 440 K/uL 159    Assessment and plan- Patient is a 36 y.o. female presents for evaluation of newly diagnosed multicentric right breast cancer.  cT2 cN0 cM0.   1. Malignant neoplasm of female breast, unspecified estrogen receptor status, unspecified laterality, unspecified site of breast ([place])   2. Malignant neoplasm of overlapping sites of right breast in female, estrogen receptor positive ([place])   3. Drug-induced neutropenia ([facility])    #Tolerated cycle 1 AC well.  # Neutropenia secondary to chemotherapy. S/p onpro. Afebirle. Continue to monitor.  Encourage patient to use Pedidex mouth wash as instructed.   Antiemetics:Phenergan and Compazine; She has no nausea.  Peridex mouth rinse BID swish and spit.   #Possibility of infertility after chemotherapy was discussed the patient and she tells me that she does not desire any future fertility. She has GYN appointment in January 2019.  Check urine HCG with every cycle.   All questions answered. Follow up on 1 week, check cbc  cmp/MD/ddAC Thank you for this kind referral and the opportunity to participate in the care of this patient  Earlie Server, MD, PhD Hematology [place] at [facility] Pager- [phone]

## 2017-11-15 ENCOUNTER — Inpatient Hospital Stay: Payer: BLUE CROSS/BLUE SHIELD

## 2017-11-15 ENCOUNTER — Inpatient Hospital Stay: Payer: BLUE CROSS/BLUE SHIELD | Attending: Oncology | Admitting: Oncology

## 2017-11-15 ENCOUNTER — Encounter: Payer: Self-pay | Admitting: Oncology

## 2017-11-15 VITALS — BP 120/70 | HR 84 | Temp 98.2°F | Resp 14 | Wt 155.0 lb

## 2017-11-15 DIAGNOSIS — Z808 Family history of malignant neoplasm of other organs or systems: Secondary | ICD-10-CM | POA: Diagnosis not present

## 2017-11-15 DIAGNOSIS — Z5111 Encounter for antineoplastic chemotherapy: Secondary | ICD-10-CM | POA: Diagnosis not present

## 2017-11-15 DIAGNOSIS — R011 Cardiac murmur, unspecified: Secondary | ICD-10-CM | POA: Diagnosis not present

## 2017-11-15 DIAGNOSIS — D702 Other drug-induced agranulocytosis: Secondary | ICD-10-CM | POA: Diagnosis not present

## 2017-11-15 DIAGNOSIS — F17210 Nicotine dependence, cigarettes, uncomplicated: Secondary | ICD-10-CM | POA: Diagnosis not present

## 2017-11-15 DIAGNOSIS — Z8052 Family history of malignant neoplasm of bladder: Secondary | ICD-10-CM

## 2017-11-15 DIAGNOSIS — Z170 Estrogen receptor positive status [ER+]: Secondary | ICD-10-CM | POA: Diagnosis not present

## 2017-11-15 DIAGNOSIS — K219 Gastro-esophageal reflux disease without esophagitis: Secondary | ICD-10-CM | POA: Diagnosis not present

## 2017-11-15 DIAGNOSIS — C50811 Malignant neoplasm of overlapping sites of right female breast: Secondary | ICD-10-CM

## 2017-11-15 DIAGNOSIS — J029 Acute pharyngitis, unspecified: Secondary | ICD-10-CM | POA: Diagnosis not present

## 2017-11-15 DIAGNOSIS — Z8049 Family history of malignant neoplasm of other genital organs: Secondary | ICD-10-CM

## 2017-11-15 DIAGNOSIS — Z79899 Other long term (current) drug therapy: Secondary | ICD-10-CM | POA: Diagnosis not present

## 2017-11-15 DIAGNOSIS — T451X5S Adverse effect of antineoplastic and immunosuppressive drugs, sequela: Secondary | ICD-10-CM | POA: Diagnosis not present

## 2017-11-15 DIAGNOSIS — C50919 Malignant neoplasm of unspecified site of unspecified female breast: Secondary | ICD-10-CM

## 2017-11-15 LAB — CBC WITH DIFFERENTIAL/PLATELET
BASOS ABS: 0 10*3/uL (ref 0–0.1)
BASOS PCT: 0 %
EOS ABS: 0.1 10*3/uL (ref 0–0.7)
EOS PCT: 4 %
HCT: 35.5 % (ref 35.0–47.0)
HEMOGLOBIN: 12.2 g/dL (ref 12.0–16.0)
Lymphocytes Relative: 66 %
Lymphs Abs: 1.3 10*3/uL (ref 1.0–3.6)
MCH: 30.1 pg (ref 26.0–34.0)
MCHC: 34.3 g/dL (ref 32.0–36.0)
MCV: 87.6 fL (ref 80.0–100.0)
Monocytes Absolute: 0.1 10*3/uL — ABNORMAL LOW (ref 0.2–0.9)
Monocytes Relative: 7 %
NEUTROS PCT: 23 %
Neutro Abs: 0.5 10*3/uL — ABNORMAL LOW (ref 1.4–6.5)
PLATELETS: 159 10*3/uL (ref 150–440)
RBC: 4.06 MIL/uL (ref 3.80–5.20)
RDW: 12.7 % (ref 11.5–14.5)
WBC: 2 10*3/uL — AB (ref 3.6–11.0)

## 2017-11-15 LAB — COMPREHENSIVE METABOLIC PANEL
ALK PHOS: 104 U/L (ref 38–126)
ALT: 11 U/L — AB (ref 14–54)
AST: 15 U/L (ref 15–41)
Albumin: 4.4 g/dL (ref 3.5–5.0)
Anion gap: 6 (ref 5–15)
BILIRUBIN TOTAL: 0.5 mg/dL (ref 0.3–1.2)
BUN: 14 mg/dL (ref 6–20)
CALCIUM: 9.2 mg/dL (ref 8.9–10.3)
CO2: 25 mmol/L (ref 22–32)
CREATININE: 0.62 mg/dL (ref 0.44–1.00)
Chloride: 103 mmol/L (ref 101–111)
Glucose, Bld: 93 mg/dL (ref 65–99)
Potassium: 3.7 mmol/L (ref 3.5–5.1)
Sodium: 134 mmol/L — ABNORMAL LOW (ref 135–145)
TOTAL PROTEIN: 7.5 g/dL (ref 6.5–8.1)

## 2017-11-15 NOTE — Progress Notes (Signed)
Patient here for follow up with labs today. She states that she is feeling well and denies having any pain. She also states that she was surprised to start her menstrual period yesterday, as she has not had one in ten years due to taking Depot injections.

## 2017-11-21 NOTE — Progress Notes (Signed)
Hematology/Oncology Follow Up Note [facility] [phone] [phone]  Patient Care Team: Juluis Pitch, MD as PCP - General (Family Medicine)   Name of the patient: Theresa Chaney  [id number]  18-Mar-1981   Date of visit: 11/21/17 REASON FOR [place]:  Newly diagnosed breast cancer  History of presenting illness 36 year old female with past medical history listed as below presented for evaluation of newly diagnosed right breast cancer. Patient self palpated a right breast mass 2 months ago during bathing. This triggered workup including mammogram and ultrasound. 10/05/2017 mammogram diagnostic breast tomo bilaterally showed multicentric area had a suspicion of breast cancer. 1  highly suspicious irregular mass with architectural distortion in the upper right breast at posterior depth measuring approximately 2 cm greatest dimension with associated architectural distortion and pleomorphic micro-calcifications. On ultrasound of vague hypoechoic masslike areas seen in the right breast at 11:30 o'clock axis. 2 second areas irregular hypoechoic mass in the right breast at the 11:30 o'clock axis, 6 cm from the nipple, at the superficial depth measuring 1.2 cm, likely correlate for a highly suspicious 1.4 cm extent of pleomorphic and a fine linear branching calcifications. 3 additional highly suspicious pleomorphic calcifications within the upper retroareolar right breast extending to this subareolar right breast, measuring 3.7 cm extending.  Right axilla was evaluated with ultrasound showing no enlarged or morphologically abnormal lymph nodes.  Patient underwent stereotactic biopsy, on 10/11/2017. Pathology showed A, carcinoma in situ intermediate to high grade with necrosis and calcifications at the right breast 11:30. B, right breast 5:30 subareolar, invasive carcinoma with diffusely infiltrative pattern in the lymphovascular invasion. In a background  of intermediate grade carcinoma in situ with necrosis and calcifications. C right breast 12:30, posterior common invasive mammary carcinoma of her type, associated with high-grade DCIS with necrosis and calcifications. Right breast 5:30 subareolar, ER +75%, PR negative HER-2 negative. Right breast 12:30 posterior, and ER +90%, and PR negative, and HER-2 negative.  Her aunt has a history of melanoma. . She is married, and she has a Psychiatrist and a biological daughter who is 38 years old. . She reports that she has a history of heart murmur which was diagnosed by primary care physician. She's never had any other health problems.   TREATMENT Neoadjuvant ddAC.  INTERVAL HISTORY Patient presents for evaluation of prior to cycle 2 neoadjuvant ddAC for breast cancer treatment.  She reports feeling well. She denies any fever, chills, fatigue, diarrhea or mouth sore. She uses Peridex mouth wash.    Review of Systems  Constitutional: Negative for fever, malaise/fatigue and weight loss.  HENT: Negative for ear pain, hearing loss and tinnitus.   Eyes: Negative for blurred vision, double vision and photophobia.  Respiratory: Negative for cough, hemoptysis and sputum production.   Cardiovascular: Negative for chest pain, palpitations and leg swelling.  Gastrointestinal: Negative for abdominal pain, heartburn, nausea and vomiting.  Genitourinary: Negative for dysuria, frequency and urgency.  Musculoskeletal: Negative for back pain, myalgias and neck pain.  Skin: Negative for itching and rash.  Neurological: Negative for dizziness, tingling, tremors and headaches.  Endo/Heme/Allergies: Negative for environmental allergies and polydipsia. Does not bruise/bleed easily.  Psychiatric/Behavioral: Negative for depression, hallucinations and substance abuse. The patient is not nervous/anxious.     No Known Allergies  Patient Active Problem List   Diagnosis Date Noted  . Family history of cancer   .  Malignant neoplasm of overlapping sites of right breast in female, estrogen receptor positive ([place]) 10/19/2017  Past Medical History:  Diagnosis Date  . Breast cancer ([place])   . Depression   . Family history of cancer   . GERD (gastroesophageal reflux disease)      Past Surgical History:  Procedure Laterality Date  . BREAST BIOPSY Right 10/11/2017   12:00 posterior coil clip  . BREAST BIOPSY Right 10/11/2017   11:30 middle depth ribbon clip  . BREAST BIOPSY Right 10/11/2017   5:30 anterior depth x shape  . PORTACATH PLACEMENT Left 10/24/2017   Procedure: INSERTION PORT-A-CATH;  Surgeon: Robert Bellow, MD;  Location: [facility] ORS;  Service: General;  Laterality: Left;    Social History   Socioeconomic History  . Marital status: Married    Spouse name: Not on file  . Number of children: Not on file  . Years of education: Not on file  . Highest education level: Not on file  Social Needs  . Financial resource strain: Not on file  . Food insecurity - worry: Not on file  . Food insecurity - inability: Not on file  . Transportation needs - medical: Not on file  . Transportation needs - non-medical: Not on file  Occupational History  . Occupation: Occupational psychologist    Comment: Therapist, art   Tobacco Use  . Smoking status: Current Some Day Smoker    Packs/day: 1.00    Years: 5.00    Pack years: 5.00    Types: Cigarettes  . Smokeless tobacco: Never Used  Substance and Sexual Activity  . Alcohol use: No    Frequency: Never  . Drug use: No  . Sexual activity: Yes    Birth control/protection: Injection  Other Topics Concern  . Not on file  Social History Narrative  . Not on file     Family History  Problem Relation Age of Onset  . Melanoma Maternal Aunt        other aunts with BCC/SCC/Melanoma  . Diabetes Father   . Hypertension Father   . Hyperlipidemia Father   . Bladder Cancer Maternal Grandmother   . Cervical cancer Maternal Aunt 64        daughter w/ cervical cancer as well  . Melanoma Maternal Uncle        other uncles with BCC/SCC/Melanoma     Current Outpatient Medications:  .  ALPRAZolam (XANAX) 0.25 MG tablet, Take 0.25 mg at bedtime as needed by mouth for anxiety or sleep. , Disp: , Rfl:  .  Calcium Carb-Cholecalciferol (CALCIUM 600 + D PO), Take 1 tablet daily by mouth., Disp: , Rfl:  .  chlorhexidine (PERIDEX) 0.12 % solution, Use as directed 15 mLs in the mouth or throat 2 (two) times daily., Disp: 473 mL, Rfl: 0 .  escitalopram (LEXAPRO) 20 MG tablet, Take 20 mg daily by mouth., Disp: , Rfl:  .  esomeprazole (NEXIUM) 40 MG capsule, Take 40 mg daily by mouth., Disp: , Rfl:  .  ibuprofen (ADVIL,MOTRIN) 800 MG tablet, Take 800 mg every 8 (eight) hours as needed by mouth for moderate pain., Disp: , Rfl:  .  lidocaine-prilocaine (EMLA) cream, Apply to affected area once, Disp: 30 g, Rfl: 3 .  Loratadine (CLARITIN) 10 MG CAPS, Take by mouth., Disp: , Rfl:  .  prochlorperazine (COMPAZINE) 10 MG tablet, Take 1 tablet (10 mg total) by mouth every 6 (six) hours as needed (Nausea or vomiting). (Patient not taking: Reported on 11/08/2017), Disp: 30 tablet, Rfl: 1 .  promethazine (PHENERGAN) 25 MG tablet, Take 1  tablet (25 mg total) by mouth every 6 (six) hours as needed for nausea or vomiting. (Patient not taking: Reported on 11/08/2017), Disp: 30 tablet, Rfl: 0 .  zolpidem (AMBIEN) 5 MG tablet, Take 5 mg at bedtime by mouth., Disp: , Rfl:    Physical exam:  Vitals:   11/22/17 0900  BP: 111/70  Pulse: 80  Resp: 16  Temp: 97.8 F (36.6 C)  TempSrc: Tympanic  Weight: 157 lb 6 oz (71.4 kg)  ECOG 0 Physical Exam  Constitutional: She is oriented to person, place, and time and well-developed, well-nourished, and in no distress. No distress.  HENT:  Head: Normocephalic and atraumatic.  Mouth/Throat: No oropharyngeal exudate.  No thrush  Eyes: Conjunctivae and EOM are normal. No scleral icterus.  Neck: Normal range  of motion. Neck supple. No JVD present.  Cardiovascular: Normal rate and regular rhythm.  No murmur heard. Pulmonary/Chest: Effort normal and breath sounds normal. No respiratory distress.  Abdominal: Soft. Bowel sounds are normal. She exhibits no distension and no mass.  Musculoskeletal: Normal range of motion. She exhibits no edema or tenderness.  Lymphadenopathy:    She has no cervical adenopathy.  Neurological: She is alert and oriented to person, place, and time.  Skin: Skin is warm and dry. She is not diaphoretic. No erythema.  Psychiatric: Affect normal.  Breasts: palpable right breasts mass at 11:30, no skin or nipple changes or axillary nodes. Left breast no palpable mass or lymph node.     CMP Latest Ref Rng & Units 11/15/2017  Glucose 65 - 99 mg/dL 93  BUN 6 - 20 mg/dL 14  Creatinine 0.44 - 1.00 mg/dL 0.62  Sodium 135 - 145 mmol/L 134(L)  Potassium 3.5 - 5.1 mmol/L 3.7  Chloride 101 - 111 mmol/L 103  CO2 22 - 32 mmol/L 25  Calcium 8.9 - 10.3 mg/dL 9.2  Total Protein 6.5 - 8.1 g/dL 7.5  Total Bilirubin 0.3 - 1.2 mg/dL 0.5  Alkaline Phos 38 - 126 U/L 104  AST 15 - 41 U/L 15  ALT 14 - 54 U/L 11(L)   CBC Latest Ref Rng & Units 11/15/2017  WBC 3.6 - 11.0 K/uL 2.0(L)  Hemoglobin 12.0 - 16.0 g/dL 12.2  Hematocrit 35.0 - 47.0 % 35.5  Platelets 150 - 440 K/uL 159    Assessment and plan- Patient is a 36 y.o. female presents for evaluation of newly diagnosed multicentric right breast cancer.  cT2 cN0 cM0.   1. Malignant neoplasm of overlapping sites of right breast in female, estrogen receptor positive ([place])   2. Estrogen receptor positive status (ER+)   3. Tobacco abuse counseling   4. Encounter for antineoplastic chemotherapy    Urine HCG negative. proceed cycle 2 ddAC with onpro  # Patient was advised to follow up with GYN for contraceptive measure discussion.  Encourage patient to use Peridex mouth wash as instructed.   Antiemetics:Phenergan and Compazine; She has  no nausea.  Peridex mouth rinse BID swish and spit.    All questions answered. Follow up on 2 week, check cbc cmp/MD/ddAC/onpro Thank you for this kind referral and the opportunity to participate in the care of this patient  Earlie Server, MD, PhD Hematology [place] at [facility] Pager- [phone]

## 2017-11-22 ENCOUNTER — Inpatient Hospital Stay: Payer: BLUE CROSS/BLUE SHIELD

## 2017-11-22 ENCOUNTER — Inpatient Hospital Stay (HOSPITAL_BASED_OUTPATIENT_CLINIC_OR_DEPARTMENT_OTHER): Payer: BLUE CROSS/BLUE SHIELD | Admitting: Oncology

## 2017-11-22 ENCOUNTER — Encounter: Payer: Self-pay | Admitting: Oncology

## 2017-11-22 ENCOUNTER — Other Ambulatory Visit: Payer: Self-pay

## 2017-11-22 VITALS — BP 111/70 | HR 80 | Temp 97.8°F | Resp 16 | Wt 157.4 lb

## 2017-11-22 DIAGNOSIS — Z170 Estrogen receptor positive status [ER+]: Principal | ICD-10-CM

## 2017-11-22 DIAGNOSIS — Z79899 Other long term (current) drug therapy: Secondary | ICD-10-CM

## 2017-11-22 DIAGNOSIS — C50811 Malignant neoplasm of overlapping sites of right female breast: Secondary | ICD-10-CM | POA: Diagnosis not present

## 2017-11-22 DIAGNOSIS — Z716 Tobacco abuse counseling: Secondary | ICD-10-CM

## 2017-11-22 DIAGNOSIS — F17210 Nicotine dependence, cigarettes, uncomplicated: Secondary | ICD-10-CM

## 2017-11-22 DIAGNOSIS — R011 Cardiac murmur, unspecified: Secondary | ICD-10-CM

## 2017-11-22 DIAGNOSIS — K219 Gastro-esophageal reflux disease without esophagitis: Secondary | ICD-10-CM

## 2017-11-22 DIAGNOSIS — Z5111 Encounter for antineoplastic chemotherapy: Secondary | ICD-10-CM

## 2017-11-22 DIAGNOSIS — Z8049 Family history of malignant neoplasm of other genital organs: Secondary | ICD-10-CM

## 2017-11-22 DIAGNOSIS — Z808 Family history of malignant neoplasm of other organs or systems: Secondary | ICD-10-CM | POA: Diagnosis not present

## 2017-11-22 DIAGNOSIS — Z8052 Family history of malignant neoplasm of bladder: Secondary | ICD-10-CM

## 2017-11-22 DIAGNOSIS — C50919 Malignant neoplasm of unspecified site of unspecified female breast: Secondary | ICD-10-CM

## 2017-11-22 LAB — COMPREHENSIVE METABOLIC PANEL
ALK PHOS: 85 U/L (ref 38–126)
ALT: 23 U/L (ref 14–54)
AST: 29 U/L (ref 15–41)
Albumin: 4.3 g/dL (ref 3.5–5.0)
Anion gap: 8 (ref 5–15)
BUN: 14 mg/dL (ref 6–20)
CALCIUM: 8.9 mg/dL (ref 8.9–10.3)
CHLORIDE: 104 mmol/L (ref 101–111)
CO2: 23 mmol/L (ref 22–32)
CREATININE: 0.67 mg/dL (ref 0.44–1.00)
Glucose, Bld: 101 mg/dL — ABNORMAL HIGH (ref 65–99)
Potassium: 3.7 mmol/L (ref 3.5–5.1)
Sodium: 135 mmol/L (ref 135–145)
Total Bilirubin: 0.2 mg/dL — ABNORMAL LOW (ref 0.3–1.2)
Total Protein: 7.4 g/dL (ref 6.5–8.1)

## 2017-11-22 LAB — CBC WITH DIFFERENTIAL/PLATELET
BASOS PCT: 0 %
Basophils Absolute: 0 10*3/uL (ref 0–0.1)
EOS ABS: 0 10*3/uL (ref 0–0.7)
EOS PCT: 0 %
HCT: 36.1 % (ref 35.0–47.0)
HEMOGLOBIN: 12.4 g/dL (ref 12.0–16.0)
LYMPHS ABS: 2.4 10*3/uL (ref 1.0–3.6)
Lymphocytes Relative: 26 %
MCH: 30 pg (ref 26.0–34.0)
MCHC: 34.4 g/dL (ref 32.0–36.0)
MCV: 87.2 fL (ref 80.0–100.0)
MONOS PCT: 11 %
Monocytes Absolute: 1 10*3/uL — ABNORMAL HIGH (ref 0.2–0.9)
NEUTROS PCT: 63 %
Neutro Abs: 5.8 10*3/uL (ref 1.4–6.5)
PLATELETS: 214 10*3/uL (ref 150–440)
RBC: 4.14 MIL/uL (ref 3.80–5.20)
RDW: 12.7 % (ref 11.5–14.5)
WBC: 9.2 10*3/uL (ref 3.6–11.0)

## 2017-11-22 LAB — PREGNANCY, URINE: PREG TEST UR: NEGATIVE

## 2017-11-22 MED ORDER — SODIUM CHLORIDE 0.9 % IV SOLN
Freq: Once | INTRAVENOUS | Status: AC
  Administered 2017-11-22: 11:00:00 via INTRAVENOUS
  Filled 2017-11-22: qty 5

## 2017-11-22 MED ORDER — SODIUM CHLORIDE 0.9% FLUSH
10.0000 mL | INTRAVENOUS | Status: DC | PRN
  Administered 2017-11-22: 10 mL
  Filled 2017-11-22: qty 10

## 2017-11-22 MED ORDER — SODIUM CHLORIDE 0.9 % IV SOLN
Freq: Once | INTRAVENOUS | Status: AC
Start: 2017-11-22 — End: 2017-11-22
  Administered 2017-11-22: 10:00:00 via INTRAVENOUS
  Filled 2017-11-22: qty 1000

## 2017-11-22 MED ORDER — SODIUM CHLORIDE 0.9 % IV SOLN
Freq: Once | INTRAVENOUS | Status: AC
  Administered 2017-11-22: 10:00:00 via INTRAVENOUS
  Filled 2017-11-22: qty 1000

## 2017-11-22 MED ORDER — HEPARIN SOD (PORK) LOCK FLUSH 100 UNIT/ML IV SOLN: 500.0000 [IU] | Freq: Once | INTRAVENOUS | Status: DC | PRN

## 2017-11-22 MED ORDER — SODIUM CHLORIDE 0.9 % IV SOLN
600.0000 mg/m2 | Freq: Once | INTRAVENOUS | Status: AC
  Administered 2017-11-22: 1100 mg via INTRAVENOUS
  Filled 2017-11-22: qty 50

## 2017-11-22 MED ORDER — PALONOSETRON HCL INJECTION 0.25 MG/5ML
0.2500 mg | Freq: Once | INTRAVENOUS | Status: AC
  Administered 2017-11-22: 0.25 mg via INTRAVENOUS
  Filled 2017-11-22: qty 5

## 2017-11-22 MED ORDER — DOXORUBICIN HCL CHEMO IV INJECTION 2 MG/ML
60.0000 mg/m2 | Freq: Once | INTRAVENOUS | Status: AC
  Administered 2017-11-22: 110 mg via INTRAVENOUS
  Filled 2017-11-22: qty 55

## 2017-11-22 MED ORDER — HEPARIN SOD (PORK) LOCK FLUSH 100 UNIT/ML IV SOLN
500.0000 [IU] | Freq: Once | INTRAVENOUS | Status: AC
Start: 2017-11-22 — End: 2017-11-22
  Administered 2017-11-22: 500 [IU] via INTRAVENOUS
  Filled 2017-11-22: qty 5

## 2017-11-22 MED ORDER — PEGFILGRASTIM 6 MG/0.6ML ~~LOC~~ PSKT
6.0000 mg | PREFILLED_SYRINGE | Freq: Once | SUBCUTANEOUS | Status: AC
  Administered 2017-11-22: 6 mg via SUBCUTANEOUS
  Filled 2017-11-22: qty 0.6

## 2017-11-22 MED ORDER — SODIUM CHLORIDE 0.9% FLUSH
10.0000 mL | INTRAVENOUS | Status: DC | PRN
Start: 2017-11-22 — End: 2017-11-22
  Administered 2017-11-22: 10 mL via INTRAVENOUS
  Filled 2017-11-22: qty 10

## 2017-11-22 NOTE — Progress Notes (Signed)
Patient here today for follow up.  Patient states no new concerns today  

## 2017-11-26 ENCOUNTER — Telehealth: Payer: Self-pay | Admitting: Genetic Counselor

## 2017-11-26 DIAGNOSIS — Z1371 Encounter for nonprocreative screening for genetic disease carrier status: Secondary | ICD-10-CM

## 2017-11-26 HISTORY — DX: Encounter for nonprocreative screening for genetic disease carrier status: Z13.71

## 2017-11-26 NOTE — Telephone Encounter (Signed)
Cancer Genetics             Telegenetics Results Disclosure   Patient Name: Theresa Chaney Patient DOB: [birth year] Patient Age: 36 y.o. Phone Call Date: 11/26/2017  Referring Provider: Earlie Server, MD    [name] was called today to discuss genetic test results. Please see the Genetics telephone note from 10/30/2017 for a detailed discussion of her personal and family histories and the recommendations provided.  Genetic Testing: At the time of [name] telegenetics visit, she decided to pursue genetic testing of multiple genes associated with hereditary susceptibility to cancer. Testing included sequencing and deletion/duplication analysis. Testing did not reveal any pathogenic mutation in any of these genes.  A copy of the genetic test report will be scanned into Epic under the media tab.  The genes analyzed were the 47 genes on [facility]'s Common Cancers panel (APC, ATM, AXIN2, BARD1, BMPR1A, BRCA1, BRCA2, BRIP1, CDH1, CDK4, CDKN2A, CHEK2, CTNNA1, DICER1, EPCAM, GREM1, HOXB13, KIT, MEN1, MLH1, MSH2, MSH3, MSH6, MUTYH, NBN, NF1, NTHL1, PALB2, PDGFRA, PMS2, POLD1, POLE, PTEN, RAD50, RAD51C, RAD51D, SDHA, SDHB, SDHC, SDHD, SMAD4, SMARCA4, STK11, TP53, TSC1, TSC2, VHL).  Since the current test is not perfect, it is possible that there may be a gene mutation that current testing cannot detect, but that chance is small. It is possible that a different genetic factor, which has not yet been discovered or is not on this panel, is responsible for the cancer diagnoses in the family. Again, the likelihood of this is low. No additional testing is recommended at this time for [name].  Cancer Screening: Even though she was diagnosed at a young age, these results suggest that [name] cancer was most likely not due to an inherited predisposition. Most cancers happen by chance and this test, along with details of her family history, suggests that her cancer falls into this  category. We discussed continuing to follow the cancer screening guidelines provided by her physician.   Family Members: Given the young age of breast cancer in the family, women are recommended to speak with their own providers about initiating breast screenings in their mid-20s since [name] was diagnosed at age 82.  Any relative who had cancer at a young age or had a particularly rare cancer may also wish to pursue genetic testing. Genetic counselors can be located in other cities, by visiting the website of the [employer] ([employer]ist[employer]se) and Field seismologist for a Dietitian by zip code.   Lastly, cancer genetics is a rapidly advancing field and it is possible that new genetic tests will be appropriate for [name] in the future. We encourage [name] to remain in contact with Genetics on an annual basis so her personal and family histories can be updated.    Steele Berg, MS, [place] Certified Genetic Counselor phone: [phone]

## 2017-11-27 ENCOUNTER — Other Ambulatory Visit: Payer: Self-pay | Admitting: Oncology

## 2017-11-29 ENCOUNTER — Encounter: Payer: Self-pay | Admitting: Oncology

## 2017-11-30 ENCOUNTER — Encounter: Payer: Self-pay | Admitting: General Surgery

## 2017-12-01 ENCOUNTER — Encounter: Payer: Self-pay | Admitting: Oncology

## 2017-12-01 ENCOUNTER — Inpatient Hospital Stay (HOSPITAL_BASED_OUTPATIENT_CLINIC_OR_DEPARTMENT_OTHER): Payer: BLUE CROSS/BLUE SHIELD | Admitting: Oncology

## 2017-12-01 ENCOUNTER — Other Ambulatory Visit: Payer: Self-pay

## 2017-12-01 ENCOUNTER — Inpatient Hospital Stay: Payer: BLUE CROSS/BLUE SHIELD

## 2017-12-01 VITALS — BP 131/82 | HR 98 | Temp 96.4°F | Resp 18 | Wt 157.3 lb

## 2017-12-01 DIAGNOSIS — Z170 Estrogen receptor positive status [ER+]: Secondary | ICD-10-CM

## 2017-12-01 DIAGNOSIS — C50811 Malignant neoplasm of overlapping sites of right female breast: Secondary | ICD-10-CM

## 2017-12-01 DIAGNOSIS — Z79899 Other long term (current) drug therapy: Secondary | ICD-10-CM

## 2017-12-01 DIAGNOSIS — Z8052 Family history of malignant neoplasm of bladder: Secondary | ICD-10-CM

## 2017-12-01 DIAGNOSIS — J029 Acute pharyngitis, unspecified: Secondary | ICD-10-CM

## 2017-12-01 DIAGNOSIS — Z8049 Family history of malignant neoplasm of other genital organs: Secondary | ICD-10-CM | POA: Diagnosis not present

## 2017-12-01 DIAGNOSIS — Z808 Family history of malignant neoplasm of other organs or systems: Secondary | ICD-10-CM

## 2017-12-01 DIAGNOSIS — K219 Gastro-esophageal reflux disease without esophagitis: Secondary | ICD-10-CM

## 2017-12-01 DIAGNOSIS — D702 Other drug-induced agranulocytosis: Secondary | ICD-10-CM

## 2017-12-01 DIAGNOSIS — T451X5S Adverse effect of antineoplastic and immunosuppressive drugs, sequela: Secondary | ICD-10-CM | POA: Diagnosis not present

## 2017-12-01 DIAGNOSIS — R011 Cardiac murmur, unspecified: Secondary | ICD-10-CM | POA: Diagnosis not present

## 2017-12-01 DIAGNOSIS — F17210 Nicotine dependence, cigarettes, uncomplicated: Secondary | ICD-10-CM | POA: Diagnosis not present

## 2017-12-01 LAB — CBC WITH DIFFERENTIAL/PLATELET
BASOS ABS: 0 10*3/uL (ref 0–0.1)
Basophils Relative: 1 %
Eosinophils Absolute: 0 10*3/uL (ref 0–0.7)
Eosinophils Relative: 0 %
HEMATOCRIT: 32.5 % — AB (ref 35.0–47.0)
HEMOGLOBIN: 11.1 g/dL — AB (ref 12.0–16.0)
LYMPHS ABS: 1.7 10*3/uL (ref 1.0–3.6)
LYMPHS PCT: 56 %
MCH: 29.6 pg (ref 26.0–34.0)
MCHC: 34.1 g/dL (ref 32.0–36.0)
MCV: 87 fL (ref 80.0–100.0)
Monocytes Absolute: 0.8 10*3/uL (ref 0.2–0.9)
Monocytes Relative: 27 %
NEUTROS ABS: 0.5 10*3/uL — AB (ref 1.4–6.5)
NEUTROS PCT: 16 %
PLATELETS: 235 10*3/uL (ref 150–440)
RBC: 3.73 MIL/uL — AB (ref 3.80–5.20)
RDW: 13 % (ref 11.5–14.5)
WBC: 3 10*3/uL — AB (ref 3.6–11.0)

## 2017-12-01 MED ORDER — LIDOCAINE VISCOUS 2 % MT SOLN
10.0000 mL | OROMUCOSAL | 0 refills | Status: DC | PRN
Start: 2017-12-01 — End: 2018-07-20

## 2017-12-01 NOTE — Progress Notes (Signed)
Here for add on. Stated that x 3 days she has had excrutiating pain on swallowing. Feels like raw pain on swallowing.

## 2017-12-01 NOTE — Progress Notes (Signed)
Hematology/Oncology Follow Up Note [facility] [phone] [phone]  Patient Care Team: Juluis Pitch, MD as PCP - General (Family Medicine)   Name of the patient: Theresa Chaney  [id number]  08/24/1981   Date of visit: 12/01/17 REASON FOR [place]:  Newly diagnosed breast cancer  History of presenting illness 36 year old female with past medical history listed as below presented for evaluation of newly diagnosed right breast cancer. Patient self palpated a right breast mass 2 months ago during bathing. This triggered workup including mammogram and ultrasound. 10/05/2017 mammogram diagnostic breast tomo bilaterally showed multicentric area had a suspicion of breast cancer. 1  highly suspicious irregular mass with architectural distortion in the upper right breast at posterior depth measuring approximately 2 cm greatest dimension with associated architectural distortion and pleomorphic micro-calcifications. On ultrasound of vague hypoechoic masslike areas seen in the right breast at 11:30 o'clock axis. 2 second areas irregular hypoechoic mass in the right breast at the 11:30 o'clock axis, 6 cm from the nipple, at the superficial depth measuring 1.2 cm, likely correlate for a highly suspicious 1.4 cm extent of pleomorphic and a fine linear branching calcifications. 3 additional highly suspicious pleomorphic calcifications within the upper retroareolar right breast extending to this subareolar right breast, measuring 3.7 cm extending.  Right axilla was evaluated with ultrasound showing no enlarged or morphologically abnormal lymph nodes.  Patient underwent stereotactic biopsy, on 10/11/2017. Pathology showed A, carcinoma in situ intermediate to high grade with necrosis and calcifications at the right breast 11:30. B, right breast 5:30 subareolar, invasive carcinoma with diffusely infiltrative pattern in the lymphovascular invasion. In a background  of intermediate grade carcinoma in situ with necrosis and calcifications. C right breast 12:30, posterior common invasive mammary carcinoma of her type, associated with high-grade DCIS with necrosis and calcifications. Right breast 5:30 subareolar, ER +75%, PR negative HER-2 negative. Right breast 12:30 posterior, and ER +90%, and PR negative, and HER-2 negative.  Her aunt has a history of melanoma. . She is married, and she has a Psychiatrist and a biological daughter who is 41 years old. . She reports that she has a history of heart murmur which was diagnosed by primary care physician. She's never had any other health problems.   TREATMENT Neoadjuvant ddAC to weekly T  INTERVAL HISTORY Patient presents for evaluation of sore throat after cycle 2 neoadjuvant ddAC for breast cancer treatment.  Patient reports feeling well otherwise except that 2 days ago she started to feel swallowing pain/sore throat. Denies any fever or chills. Denies cough, congestion. Denies heartburn. She takes Nexium 40 mg daily.   Review of Systems  Constitutional: Negative for chills, fever, malaise/fatigue and weight loss.  HENT: Negative for ear pain, hearing loss and tinnitus.        Soft throat  Eyes: Negative for blurred vision and photophobia.  Respiratory: Negative for cough, hemoptysis and sputum production.   Cardiovascular: Negative for chest pain, orthopnea and leg swelling.  Gastrointestinal: Negative for abdominal pain, diarrhea, nausea and vomiting.  Genitourinary: Negative for dysuria, frequency and urgency.  Musculoskeletal: Negative for back pain, myalgias and neck pain.  Skin: Negative for itching and rash.  Neurological: Negative for tingling, tremors and sensory change.  Endo/Heme/Allergies: Negative for polydipsia. Does not bruise/bleed easily.  Psychiatric/Behavioral: Negative for depression and substance abuse. The patient is not nervous/anxious.     No Known Allergies  Patient Active  Problem List   Diagnosis Date Noted  . Family history of cancer   .  Malignant neoplasm of overlapping sites of right breast in female, estrogen receptor positive ([place]) 10/19/2017     Past Medical History:  Diagnosis Date  . Breast cancer ([place])   . Depression   . Family history of cancer   . GERD (gastroesophageal reflux disease)      Past Surgical History:  Procedure Laterality Date  . BREAST BIOPSY Right 10/11/2017   12:00 posterior coil clip  . BREAST BIOPSY Right 10/11/2017   11:30 middle depth ribbon clip  . BREAST BIOPSY Right 10/11/2017   5:30 anterior depth x shape  . PORTACATH PLACEMENT Left 10/24/2017   Procedure: INSERTION PORT-A-CATH;  Surgeon: Robert Bellow, MD;  Location: [facility] ORS;  Service: General;  Laterality: Left;    Social History   Socioeconomic History  . Marital status: Married    Spouse name: Not on file  . Number of children: Not on file  . Years of education: Not on file  . Highest education level: Not on file  Social Needs  . Financial resource strain: Not on file  . Food insecurity - worry: Not on file  . Food insecurity - inability: Not on file  . Transportation needs - medical: Not on file  . Transportation needs - non-medical: Not on file  Occupational History  . Occupation: Occupational psychologist    Comment: Therapist, art   Tobacco Use  . Smoking status: Current Some Day Smoker    Packs/day: 1.00    Years: 5.00    Pack years: 5.00    Types: Cigarettes  . Smokeless tobacco: Never Used  Substance and Sexual Activity  . Alcohol use: No    Frequency: Never  . Drug use: No  . Sexual activity: Yes    Birth control/protection: Injection  Other Topics Concern  . Not on file  Social History Narrative  . Not on file     Family History  Problem Relation Age of Onset  . Melanoma Maternal Aunt        other aunts with BCC/SCC/Melanoma  . Diabetes Father   . Hypertension Father   . Hyperlipidemia Father   .  Bladder Cancer Maternal Grandmother   . Cervical cancer Maternal Aunt 64       daughter w/ cervical cancer as well  . Melanoma Maternal Uncle        other uncles with BCC/SCC/Melanoma     Current Outpatient Medications:  .  ALPRAZolam (XANAX) 0.25 MG tablet, Take 0.25 mg at bedtime as needed by mouth for anxiety or sleep. , Disp: , Rfl:  .  chlorhexidine (PERIDEX) 0.12 % solution, USE AS DIRECTED 15MLS IN THE MOUTH OR THROAT TWICE A DAY, Disp: 473 mL, Rfl: 0 .  escitalopram (LEXAPRO) 20 MG tablet, Take 20 mg daily by mouth., Disp: , Rfl:  .  esomeprazole (NEXIUM) 40 MG capsule, Take 40 mg daily by mouth., Disp: , Rfl:  .  ibuprofen (ADVIL,MOTRIN) 800 MG tablet, Take 800 mg every 8 (eight) hours as needed by mouth for moderate pain., Disp: , Rfl:  .  Loratadine (CLARITIN) 10 MG CAPS, Take by mouth., Disp: , Rfl:  .  prochlorperazine (COMPAZINE) 10 MG tablet, Take 1 tablet (10 mg total) by mouth every 6 (six) hours as needed (Nausea or vomiting)., Disp: 30 tablet, Rfl: 1 .  promethazine (PHENERGAN) 25 MG tablet, Take 1 tablet (25 mg total) by mouth every 6 (six) hours as needed for nausea or vomiting., Disp: 30 tablet, Rfl: 0 .  Calcium Carb-Cholecalciferol (CALCIUM 600 + D PO), Take 1 tablet daily by mouth., Disp: , Rfl:  .  lidocaine-prilocaine (EMLA) cream, Apply to affected area once (Patient not taking: Reported on 12/01/2017), Disp: 30 g, Rfl: 3 .  zolpidem (AMBIEN) 5 MG tablet, Take 5 mg at bedtime by mouth., Disp: , Rfl:    Physical exam:  Vitals:   12/01/17 1329  BP: 131/82  Pulse: 98  Resp: 18  Temp: (!) 96.4 F (35.8 C)  TempSrc: Tympanic  Weight: 157 lb 4.8 oz (71.4 kg)  ECOG 0 Physical Exam  Constitutional: She is oriented to person, place, and time and well-developed, well-nourished, and in no distress. No distress.  HENT:  Head: Normocephalic and atraumatic.  Mouth/Throat: No oropharyngeal exudate.  No thrush.  Pharynx and tonsil surgeon not erythematous. No  exudate  Eyes: Conjunctivae and EOM are normal. No scleral icterus.  Neck: Normal range of motion. Neck supple. No JVD present.  Cardiovascular: Normal rate and regular rhythm.  No murmur heard. Pulmonary/Chest: Effort normal and breath sounds normal. No respiratory distress.  Abdominal: Soft. Bowel sounds are normal. She exhibits no distension and no mass.  Musculoskeletal: Normal range of motion. She exhibits no edema or tenderness.  Lymphadenopathy:    She has no cervical adenopathy.  Neurological: She is alert and oriented to person, place, and time.  Skin: Skin is warm and dry. She is not diaphoretic. No erythema.  Psychiatric: Affect normal.  Breasts: palpable right breasts mass at 11:30, no skin or nipple changes or axillary nodes. Left breast no palpable mass or lymph node.     CMP Latest Ref Rng & Units 11/22/2017  Glucose 65 - 99 mg/dL 101(H)  BUN 6 - 20 mg/dL 14  Creatinine 0.44 - 1.00 mg/dL 0.67  Sodium 135 - 145 mmol/L 135  Potassium 3.5 - 5.1 mmol/L 3.7  Chloride 101 - 111 mmol/L 104  CO2 22 - 32 mmol/L 23  Calcium 8.9 - 10.3 mg/dL 8.9  Total Protein 6.5 - 8.1 g/dL 7.4  Total Bilirubin 0.3 - 1.2 mg/dL 0.2(L)  Alkaline Phos 38 - 126 U/L 85  AST 15 - 41 U/L 29  ALT 14 - 54 U/L 23   CBC Latest Ref Rng & Units 11/22/2017  WBC 3.6 - 11.0 K/uL 9.2  Hemoglobin 12.0 - 16.0 g/dL 12.4  Hematocrit 35.0 - 47.0 % 36.1  Platelets 150 - 440 K/uL 214    Assessment and plan- Patient is a 36 y.o. female presents for evaluation of newly diagnosed multicentric right breast cancer.  cT2 cN0 cM0.   1. Sore throat   2. Other drug-induced neutropenia (HCC)    # Sore throat, without pharynx/tonsil swelling or erythematous changes. Likely mucositis. I'll give her a trial of lidocaine viscus solution orally before meal to see if that will help with ease the pain and discomfort. If she spikes fever, she needs to seek immediate medical advice. Patient voices  understanding.  #Drug-induced neutropenia, she had received.onpro-Neulasta after cycle 2 dose dense AC. Continue to monitor.   All questions answered. Follow up on 1 week, check cbc cmp/MD/ddAC/onpro. Patient knows to call if anything concerns or questions during interval.   Earlie Server, MD, PhD Hematology [facility] Pager- [phone]

## 2017-12-05 NOTE — Progress Notes (Signed)
Hematology/Oncology Follow Up Note [facility] Telephone:([phone] [phone]  Patient Care Team: Juluis Pitch, MD as PCP - General (Family Medicine)   Name of the patient: Theresa Chaney  [id number]  08-07-1981   Date of visit: 12/05/17 REASON FOR [place]:  Treatment of  breast cancer  History of presenting illness 36 year old female with past medical history listed as below presented for evaluation of newly diagnosed right breast cancer. Patient self palpated a right breast mass 2 months ago during bathing. This triggered workup including mammogram and ultrasound. 10/05/2017 mammogram diagnostic breast tomo bilaterally showed multicentric area had a suspicion of breast cancer. 1  highly suspicious irregular mass with architectural distortion in the upper right breast at posterior depth measuring approximately 2 cm greatest dimension with associated architectural distortion and pleomorphic micro-calcifications. On ultrasound of vague hypoechoic masslike areas seen in the right breast at 11:30 o'clock axis. 2 second areas irregular hypoechoic mass in the right breast at the 11:30 o'clock axis, 6 cm from the nipple, at the superficial depth measuring 1.2 cm, likely correlate for a highly suspicious 1.4 cm extent of pleomorphic and a fine linear branching calcifications. 3 additional highly suspicious pleomorphic calcifications within the upper retroareolar right breast extending to this subareolar right breast, measuring 3.7 cm extending.  Right axilla was evaluated with ultrasound showing no enlarged or morphologically abnormal lymph nodes.  Patient underwent stereotactic biopsy, on 10/11/2017. Pathology showed A, carcinoma in situ intermediate to high grade with necrosis and calcifications at the right breast 11:30. B, right breast 5:30 subareolar, invasive carcinoma with diffusely infiltrative pattern in the lymphovascular invasion. In a background  of intermediate grade carcinoma in situ with necrosis and calcifications. C right breast 12:30, posterior common invasive mammary carcinoma of her type, associated with high-grade DCIS with necrosis and calcifications. Right breast 5:30 subareolar, ER +75%, PR negative HER-2 negative. Right breast 12:30 posterior, and ER +90%, and PR negative, and HER-2 negative.  Her aunt has a history of melanoma. . She is married, and she has a Psychiatrist and a biological daughter who is 9 years old. . She reports that she has a history of heart murmur which was diagnosed by primary care physician. She's never had any other health problems.   TREATMENT Neoadjuvant ddAC.  INTERVAL HISTORY Patient presents for evaluation of prior to cycle 3 neoadjuvant ddAC for breast cancer treatment.  Patient reports doing well except soft throat. She was seen by me during interval and lidocaine viscous solution was prescribed patient reports that the solution was hard to tolerate. Her symptoms has improved. Denies any fever or chills, fatigue, diarrhea. Denies any mouth sores. She uses Peridex mouthwash.   Review of Systems  Constitutional: Negative for fever, malaise/fatigue and weight loss.  HENT: Negative for ear pain, hearing loss and tinnitus.   Eyes: Negative for blurred vision, double vision and photophobia.  Respiratory: Negative for cough, hemoptysis and sputum production.   Cardiovascular: Negative for chest pain, palpitations and leg swelling.  Gastrointestinal: Negative for abdominal pain, heartburn, nausea and vomiting.  Genitourinary: Negative for dysuria, frequency and urgency.  Musculoskeletal: Negative for back pain, myalgias and neck pain.  Skin: Negative for itching and rash.  Neurological: Negative for dizziness, tingling, tremors and headaches.  Endo/Heme/Allergies: Negative for environmental allergies and polydipsia. Does not bruise/bleed easily.  Psychiatric/Behavioral: Negative for depression,  hallucinations and substance abuse. The patient is not nervous/anxious.     No Known Allergies  Patient Active Problem List   Diagnosis Date  Noted  . Family history of cancer   . Malignant neoplasm of overlapping sites of right breast in female, estrogen receptor positive ([place]) 10/19/2017     Past Medical History:  Diagnosis Date  . Breast cancer ([place])   . Depression   . Family history of cancer   . GERD (gastroesophageal reflux disease)      Past Surgical History:  Procedure Laterality Date  . BREAST BIOPSY Right 10/11/2017   12:00 posterior coil clip  . BREAST BIOPSY Right 10/11/2017   11:30 middle depth ribbon clip  . BREAST BIOPSY Right 10/11/2017   5:30 anterior depth x shape  . PORTACATH PLACEMENT Left 10/24/2017   Procedure: INSERTION PORT-A-CATH;  Surgeon: Robert Bellow, MD;  Location: [facility] ORS;  Service: General;  Laterality: Left;    Social History   Socioeconomic History  . Marital status: Married    Spouse name: Not on file  . Number of children: Not on file  . Years of education: Not on file  . Highest education level: Not on file  Social Needs  . Financial resource strain: Not on file  . Food insecurity - worry: Not on file  . Food insecurity - inability: Not on file  . Transportation needs - medical: Not on file  . Transportation needs - non-medical: Not on file  Occupational History  . Occupation: Occupational psychologist    Comment: Therapist, art   Tobacco Use  . Smoking status: Current Some Day Smoker    Packs/day: 1.00    Years: 5.00    Pack years: 5.00    Types: Cigarettes  . Smokeless tobacco: Never Used  Substance and Sexual Activity  . Alcohol use: No    Frequency: Never  . Drug use: No  . Sexual activity: Yes    Birth control/protection: Injection  Other Topics Concern  . Not on file  Social History Narrative  . Not on file     Family History  Problem Relation Age of Onset  . Melanoma Maternal Aunt         other aunts with BCC/SCC/Melanoma  . Diabetes Father   . Hypertension Father   . Hyperlipidemia Father   . Bladder Cancer Maternal Grandmother   . Cervical cancer Maternal Aunt 64       daughter w/ cervical cancer as well  . Melanoma Maternal Uncle        other uncles with BCC/SCC/Melanoma     Current Outpatient Medications:  .  ALPRAZolam (XANAX) 0.25 MG tablet, Take 0.25 mg at bedtime as needed by mouth for anxiety or sleep. , Disp: , Rfl:  .  Calcium Carb-Cholecalciferol (CALCIUM 600 + D PO), Take 1 tablet daily by mouth., Disp: , Rfl:  .  chlorhexidine (PERIDEX) 0.12 % solution, USE AS DIRECTED 15MLS IN THE MOUTH OR THROAT TWICE A DAY, Disp: 473 mL, Rfl: 0 .  escitalopram (LEXAPRO) 20 MG tablet, Take 20 mg daily by mouth., Disp: , Rfl:  .  esomeprazole (NEXIUM) 40 MG capsule, Take 40 mg daily by mouth., Disp: , Rfl:  .  ibuprofen (ADVIL,MOTRIN) 800 MG tablet, Take 800 mg every 8 (eight) hours as needed by mouth for moderate pain., Disp: , Rfl:  .  lidocaine (XYLOCAINE) 2 % solution, Use as directed 10 mLs in the mouth or throat every 4 (four) hours as needed for mouth pain., Disp: 200 mL, Rfl: 0 .  lidocaine-prilocaine (EMLA) cream, Apply to affected area once (Patient not taking: Reported on  12/01/2017), Disp: 30 g, Rfl: 3 .  Loratadine (CLARITIN) 10 MG CAPS, Take by mouth., Disp: , Rfl:  .  prochlorperazine (COMPAZINE) 10 MG tablet, Take 1 tablet (10 mg total) by mouth every 6 (six) hours as needed (Nausea or vomiting)., Disp: 30 tablet, Rfl: 1 .  promethazine (PHENERGAN) 25 MG tablet, Take 1 tablet (25 mg total) by mouth every 6 (six) hours as needed for nausea or vomiting., Disp: 30 tablet, Rfl: 0 .  zolpidem (AMBIEN) 5 MG tablet, Take 5 mg at bedtime by mouth., Disp: , Rfl:    Physical exam:  There were no vitals filed for this visit.ECOG 0 Physical Exam  Constitutional: She is oriented to person, place, and time and well-developed, well-nourished, and in no distress. No  distress.  HENT:  Head: Normocephalic and atraumatic.  Nose: Nose normal.  Mouth/Throat: Oropharynx is clear and moist. No oropharyngeal exudate.  No thrush  Eyes: Conjunctivae and EOM are normal. No scleral icterus.  Neck: Normal range of motion. Neck supple. No JVD present.  Cardiovascular: Normal rate, regular rhythm and normal heart sounds. Exam reveals no gallop and no friction rub.  No murmur heard. Pulmonary/Chest: Effort normal and breath sounds normal. No respiratory distress. She has no rales.  Abdominal: Soft. Bowel sounds are normal. She exhibits no distension and no mass. There is no rebound.  Musculoskeletal: Normal range of motion. She exhibits no edema, tenderness or deformity.  Lymphadenopathy:    She has no cervical adenopathy.  Neurological: She is alert and oriented to person, place, and time. She displays normal reflexes. No cranial nerve deficit.  Skin: Skin is warm and dry. No erythema.  Psychiatric: Affect and judgment normal.  Breasts: palpable right breast mass at 11:30, no skin or nipple changes or axillary nodes, no changes comparing to prior exam. Left breast no palpable mass or lymph node.     CMP Latest Ref Rng & Units 11/22/2017  Glucose 65 - 99 mg/dL 101(H)  BUN 6 - 20 mg/dL 14  Creatinine 0.44 - 1.00 mg/dL 0.67  Sodium 135 - 145 mmol/L 135  Potassium 3.5 - 5.1 mmol/L 3.7  Chloride 101 - 111 mmol/L 104  CO2 22 - 32 mmol/L 23  Calcium 8.9 - 10.3 mg/dL 8.9  Total Protein 6.5 - 8.1 g/dL 7.4  Total Bilirubin 0.3 - 1.2 mg/dL 0.2(L)  Alkaline Phos 38 - 126 U/L 85  AST 15 - 41 U/L 29  ALT 14 - 54 U/L 23   CBC Latest Ref Rng & Units 12/01/2017  WBC 3.6 - 11.0 K/uL 3.0(L)  Hemoglobin 12.0 - 16.0 g/dL 11.1(L)  Hematocrit 35.0 - 47.0 % 32.5(L)  Platelets 150 - 440 K/uL 235    Assessment and plan- Patient is a 36 y.o. female presents for evaluation of newly diagnosed multicentric right breast cancer.  cT2 cN0 cM0.   1. Malignant neoplasm of  overlapping sites of right breast in female, estrogen receptor positive ([place])   2. Encounter for antineoplastic chemotherapy    Urine HCG negative. proceed cycle 3 ddAC with onpro  Encourage patient to use Peridex mouth wash as instructed.  Previous sore throat has resolved, likely mucositis due to transient neutropenia. Today ANC adequate.  Advised patient to see GYN for recommendation of no hormone contraceptive measure. Discussed with patient that chemotherapy are teratogenic and discussed about avoiding pregnancy during chemotherapy Antiemetics:Phenergan and Compazine; She has no nausea.  Peridex mouth rinse BID swish and spit.  All questions answered. Follow up on 2  week, check cbc cmp/MD/ddAC/onpro Thank you for this kind referral and the opportunity to participate in the care of this patient  Earlie Server, MD, PhD Hematology [place] at [facility] Pager- [phone]

## 2017-12-06 ENCOUNTER — Inpatient Hospital Stay: Payer: BLUE CROSS/BLUE SHIELD

## 2017-12-06 ENCOUNTER — Encounter: Payer: Self-pay | Admitting: Oncology

## 2017-12-06 ENCOUNTER — Inpatient Hospital Stay (HOSPITAL_BASED_OUTPATIENT_CLINIC_OR_DEPARTMENT_OTHER): Payer: BLUE CROSS/BLUE SHIELD | Admitting: Oncology

## 2017-12-06 ENCOUNTER — Other Ambulatory Visit: Payer: Self-pay

## 2017-12-06 VITALS — BP 102/64 | HR 94 | Temp 97.6°F | Resp 18 | Wt 157.2 lb

## 2017-12-06 DIAGNOSIS — Z5111 Encounter for antineoplastic chemotherapy: Secondary | ICD-10-CM

## 2017-12-06 DIAGNOSIS — R011 Cardiac murmur, unspecified: Secondary | ICD-10-CM | POA: Diagnosis not present

## 2017-12-06 DIAGNOSIS — Z808 Family history of malignant neoplasm of other organs or systems: Secondary | ICD-10-CM

## 2017-12-06 DIAGNOSIS — K219 Gastro-esophageal reflux disease without esophagitis: Secondary | ICD-10-CM | POA: Diagnosis not present

## 2017-12-06 DIAGNOSIS — C50811 Malignant neoplasm of overlapping sites of right female breast: Secondary | ICD-10-CM | POA: Diagnosis not present

## 2017-12-06 DIAGNOSIS — Z79899 Other long term (current) drug therapy: Secondary | ICD-10-CM | POA: Diagnosis not present

## 2017-12-06 DIAGNOSIS — T451X5S Adverse effect of antineoplastic and immunosuppressive drugs, sequela: Secondary | ICD-10-CM

## 2017-12-06 DIAGNOSIS — Z170 Estrogen receptor positive status [ER+]: Secondary | ICD-10-CM

## 2017-12-06 DIAGNOSIS — J029 Acute pharyngitis, unspecified: Secondary | ICD-10-CM | POA: Diagnosis not present

## 2017-12-06 DIAGNOSIS — F17210 Nicotine dependence, cigarettes, uncomplicated: Secondary | ICD-10-CM

## 2017-12-06 DIAGNOSIS — Z8049 Family history of malignant neoplasm of other genital organs: Secondary | ICD-10-CM | POA: Diagnosis not present

## 2017-12-06 DIAGNOSIS — D702 Other drug-induced agranulocytosis: Secondary | ICD-10-CM | POA: Diagnosis not present

## 2017-12-06 DIAGNOSIS — Z8052 Family history of malignant neoplasm of bladder: Secondary | ICD-10-CM | POA: Diagnosis not present

## 2017-12-06 DIAGNOSIS — C50919 Malignant neoplasm of unspecified site of unspecified female breast: Secondary | ICD-10-CM

## 2017-12-06 LAB — CBC WITH DIFFERENTIAL/PLATELET
BASOS ABS: 0 10*3/uL (ref 0–0.1)
BASOS PCT: 0 %
EOS ABS: 0 10*3/uL (ref 0–0.7)
Eosinophils Relative: 0 %
HEMATOCRIT: 33.6 % — AB (ref 35.0–47.0)
HEMOGLOBIN: 11.6 g/dL — AB (ref 12.0–16.0)
Lymphocytes Relative: 20 %
Lymphs Abs: 1.9 10*3/uL (ref 1.0–3.6)
MCH: 30.1 pg (ref 26.0–34.0)
MCHC: 34.4 g/dL (ref 32.0–36.0)
MCV: 87.3 fL (ref 80.0–100.0)
Monocytes Absolute: 1 10*3/uL — ABNORMAL HIGH (ref 0.2–0.9)
Monocytes Relative: 11 %
NEUTROS ABS: 6.6 10*3/uL — AB (ref 1.4–6.5)
NEUTROS PCT: 69 %
Platelets: 213 10*3/uL (ref 150–440)
RBC: 3.86 MIL/uL (ref 3.80–5.20)
RDW: 13.7 % (ref 11.5–14.5)
WBC: 9.6 10*3/uL (ref 3.6–11.0)

## 2017-12-06 LAB — COMPREHENSIVE METABOLIC PANEL
ALBUMIN: 4.2 g/dL (ref 3.5–5.0)
ALK PHOS: 84 U/L (ref 38–126)
ALT: 13 U/L — ABNORMAL LOW (ref 14–54)
ANION GAP: 8 (ref 5–15)
AST: 28 U/L (ref 15–41)
BILIRUBIN TOTAL: 0.4 mg/dL (ref 0.3–1.2)
BUN: 16 mg/dL (ref 6–20)
CO2: 24 mmol/L (ref 22–32)
Calcium: 8.9 mg/dL (ref 8.9–10.3)
Chloride: 105 mmol/L (ref 101–111)
Creatinine, Ser: 0.65 mg/dL (ref 0.44–1.00)
GFR calc Af Amer: >60 mL/min (ref >60)
GFR calc non Af Amer: >60 mL/min (ref >60)
GLUCOSE: 119 mg/dL — AB (ref 65–99)
POTASSIUM: 3.5 mmol/L (ref 3.5–5.1)
SODIUM: 137 mmol/L (ref 135–145)
TOTAL PROTEIN: 7.4 g/dL (ref 6.5–8.1)

## 2017-12-06 LAB — PREGNANCY, URINE: PREG TEST UR: NEGATIVE

## 2017-12-06 MED ORDER — SODIUM CHLORIDE 0.9 % IV SOLN
Freq: Once | INTRAVENOUS | Status: AC
  Administered 2017-12-06: 11:00:00 via INTRAVENOUS
  Filled 2017-12-06: qty 5

## 2017-12-06 MED ORDER — PALONOSETRON HCL INJECTION 0.25 MG/5ML
0.2500 mg | Freq: Once | INTRAVENOUS | Status: AC
  Administered 2017-12-06: 0.25 mg via INTRAVENOUS
  Filled 2017-12-06: qty 5

## 2017-12-06 MED ORDER — HEPARIN SOD (PORK) LOCK FLUSH 100 UNIT/ML IV SOLN
500.0000 [IU] | Freq: Once | INTRAVENOUS | Status: AC
  Administered 2017-12-06: 500 [IU] via INTRAVENOUS
  Filled 2017-12-06: qty 5

## 2017-12-06 MED ORDER — PEGFILGRASTIM 6 MG/0.6ML ~~LOC~~ PSKT
6.0000 mg | PREFILLED_SYRINGE | Freq: Once | SUBCUTANEOUS | Status: AC
  Administered 2017-12-06: 6 mg via SUBCUTANEOUS
  Filled 2017-12-06: qty 0.6

## 2017-12-06 MED ORDER — SODIUM CHLORIDE 0.9 % IV SOLN
Freq: Once | INTRAVENOUS | Status: DC
  Filled 2017-12-06: qty 1000

## 2017-12-06 MED ORDER — SODIUM CHLORIDE 0.9 % IV SOLN
600.0000 mg/m2 | Freq: Once | INTRAVENOUS | Status: AC
  Administered 2017-12-06: 1100 mg via INTRAVENOUS
  Filled 2017-12-06: qty 50

## 2017-12-06 MED ORDER — DOXORUBICIN HCL CHEMO IV INJECTION 2 MG/ML
60.0000 mg/m2 | Freq: Once | INTRAVENOUS | Status: AC
  Administered 2017-12-06: 110 mg via INTRAVENOUS
  Filled 2017-12-06: qty 55

## 2017-12-06 MED ORDER — SODIUM CHLORIDE 0.9% FLUSH
10.0000 mL | Freq: Once | INTRAVENOUS | Status: AC
  Administered 2017-12-06: 10 mL via INTRAVENOUS
  Filled 2017-12-06: qty 10

## 2017-12-06 MED ORDER — SODIUM CHLORIDE 0.9 % IV SOLN
Freq: Once | INTRAVENOUS | Status: AC
  Administered 2017-12-06: 10:00:00 via INTRAVENOUS
  Filled 2017-12-06: qty 1000

## 2017-12-06 NOTE — Progress Notes (Signed)
Here for follow up. Feeling better today  She stated.

## 2017-12-11 ENCOUNTER — Emergency Department: Payer: BLUE CROSS/BLUE SHIELD

## 2017-12-11 ENCOUNTER — Telehealth: Payer: Self-pay | Admitting: *Deleted

## 2017-12-11 ENCOUNTER — Other Ambulatory Visit: Payer: Self-pay

## 2017-12-11 ENCOUNTER — Encounter: Payer: Self-pay | Admitting: Emergency Medicine

## 2017-12-11 ENCOUNTER — Emergency Department
Admission: EM | Admit: 2017-12-11 | Discharge: 2017-12-11 | Disposition: A | Discharge status: Home / Self Care | Payer: BLUE CROSS/BLUE SHIELD | Attending: Student in an Organized Health Care Education/Training Program | Admitting: Student in an Organized Health Care Education/Training Program

## 2017-12-11 DIAGNOSIS — R059 Cough, unspecified: Secondary | ICD-10-CM

## 2017-12-11 DIAGNOSIS — F17210 Nicotine dependence, cigarettes, uncomplicated: Secondary | ICD-10-CM | POA: Diagnosis not present

## 2017-12-11 DIAGNOSIS — R509 Fever, unspecified: Secondary | ICD-10-CM | POA: Diagnosis present

## 2017-12-11 DIAGNOSIS — J101 Influenza due to other identified influenza virus with other respiratory manifestations: Secondary | ICD-10-CM | POA: Diagnosis not present

## 2017-12-11 DIAGNOSIS — M7918 Myalgia, other site: Secondary | ICD-10-CM | POA: Diagnosis not present

## 2017-12-11 DIAGNOSIS — Z79899 Other long term (current) drug therapy: Secondary | ICD-10-CM | POA: Diagnosis not present

## 2017-12-11 DIAGNOSIS — R05 Cough: Secondary | ICD-10-CM | POA: Insufficient documentation

## 2017-12-11 DIAGNOSIS — M791 Myalgia, unspecified site: Secondary | ICD-10-CM

## 2017-12-11 LAB — COMPREHENSIVE METABOLIC PANEL
ALBUMIN: 4.3 g/dL (ref 3.5–5.0)
ALK PHOS: 199 U/L — AB (ref 38–126)
ALT: 11 U/L — ABNORMAL LOW (ref 14–54)
ANION GAP: 9 (ref 5–15)
AST: 18 U/L (ref 15–41)
BILIRUBIN TOTAL: 0.8 mg/dL (ref 0.3–1.2)
BUN: 13 mg/dL (ref 6–20)
CALCIUM: 8.4 mg/dL — AB (ref 8.9–10.3)
CO2: 23 mmol/L (ref 22–32)
Chloride: 103 mmol/L (ref 101–111)
Creatinine, Ser: 0.41 mg/dL — ABNORMAL LOW (ref 0.44–1.00)
GFR calc Af Amer: >60 mL/min (ref >60)
Glucose, Bld: 107 mg/dL — ABNORMAL HIGH (ref 65–99)
POTASSIUM: 3.2 mmol/L — AB (ref 3.5–5.1)
Sodium: 135 mmol/L (ref 135–145)
TOTAL PROTEIN: 7.3 g/dL (ref 6.5–8.1)

## 2017-12-11 LAB — CBC WITH DIFFERENTIAL/PLATELET
BASOS ABS: 0 10*3/uL (ref 0–0.1)
BASOS PCT: 0 %
EOS ABS: 0 10*3/uL (ref 0–0.7)
Eosinophils Relative: 0 %
HEMATOCRIT: 30.1 % — AB (ref 35.0–47.0)
HEMOGLOBIN: 10.2 g/dL — AB (ref 12.0–16.0)
LYMPHS PCT: 2 %
Lymphs Abs: 0.3 10*3/uL — ABNORMAL LOW (ref 1.0–3.6)
MCH: 29.5 pg (ref 26.0–34.0)
MCHC: 34 g/dL (ref 32.0–36.0)
MCV: 86.8 fL (ref 80.0–100.0)
MONO ABS: 0.2 10*3/uL (ref 0.2–0.9)
Monocytes Relative: 1 %
NEUTROS PCT: 97 %
Neutro Abs: 14.6 10*3/uL — ABNORMAL HIGH (ref 1.4–6.5)
Platelets: 207 10*3/uL (ref 150–440)
RBC: 3.46 MIL/uL — AB (ref 3.80–5.20)
RDW: 14.1 % (ref 11.5–14.5)
WBC: 15.1 10*3/uL — AB (ref 3.6–11.0)

## 2017-12-11 LAB — LACTIC ACID, PLASMA: LACTIC ACID, VENOUS: 0.9 mmol/L (ref 0.5–1.9)

## 2017-12-11 LAB — PATHOLOGIST SMEAR REVIEW

## 2017-12-11 LAB — PROTIME-INR
INR: 1.04
PROTHROMBIN TIME: 13.5 s (ref 11.4–15.2)

## 2017-12-11 LAB — INFLUENZA PANEL BY PCR (TYPE A & B)
INFLAPCR: POSITIVE — AB
Influenza B By PCR: NEGATIVE

## 2017-12-11 IMAGING — CR DG CHEST 2V
1 series · 2 of 2 positions shown · non-contrast
Comparison: [DATE].

CLINICAL DATA: Fever, chills and myalgias since this morning.
Undergoing chemotherapy for breast cancer. Productive cough. Smoker.

EXAM:
CHEST  2 VIEW

[Series 1: dg chest 2 view · 0.14mm/px · 2 of 2 slices shown]
[im 1/2]
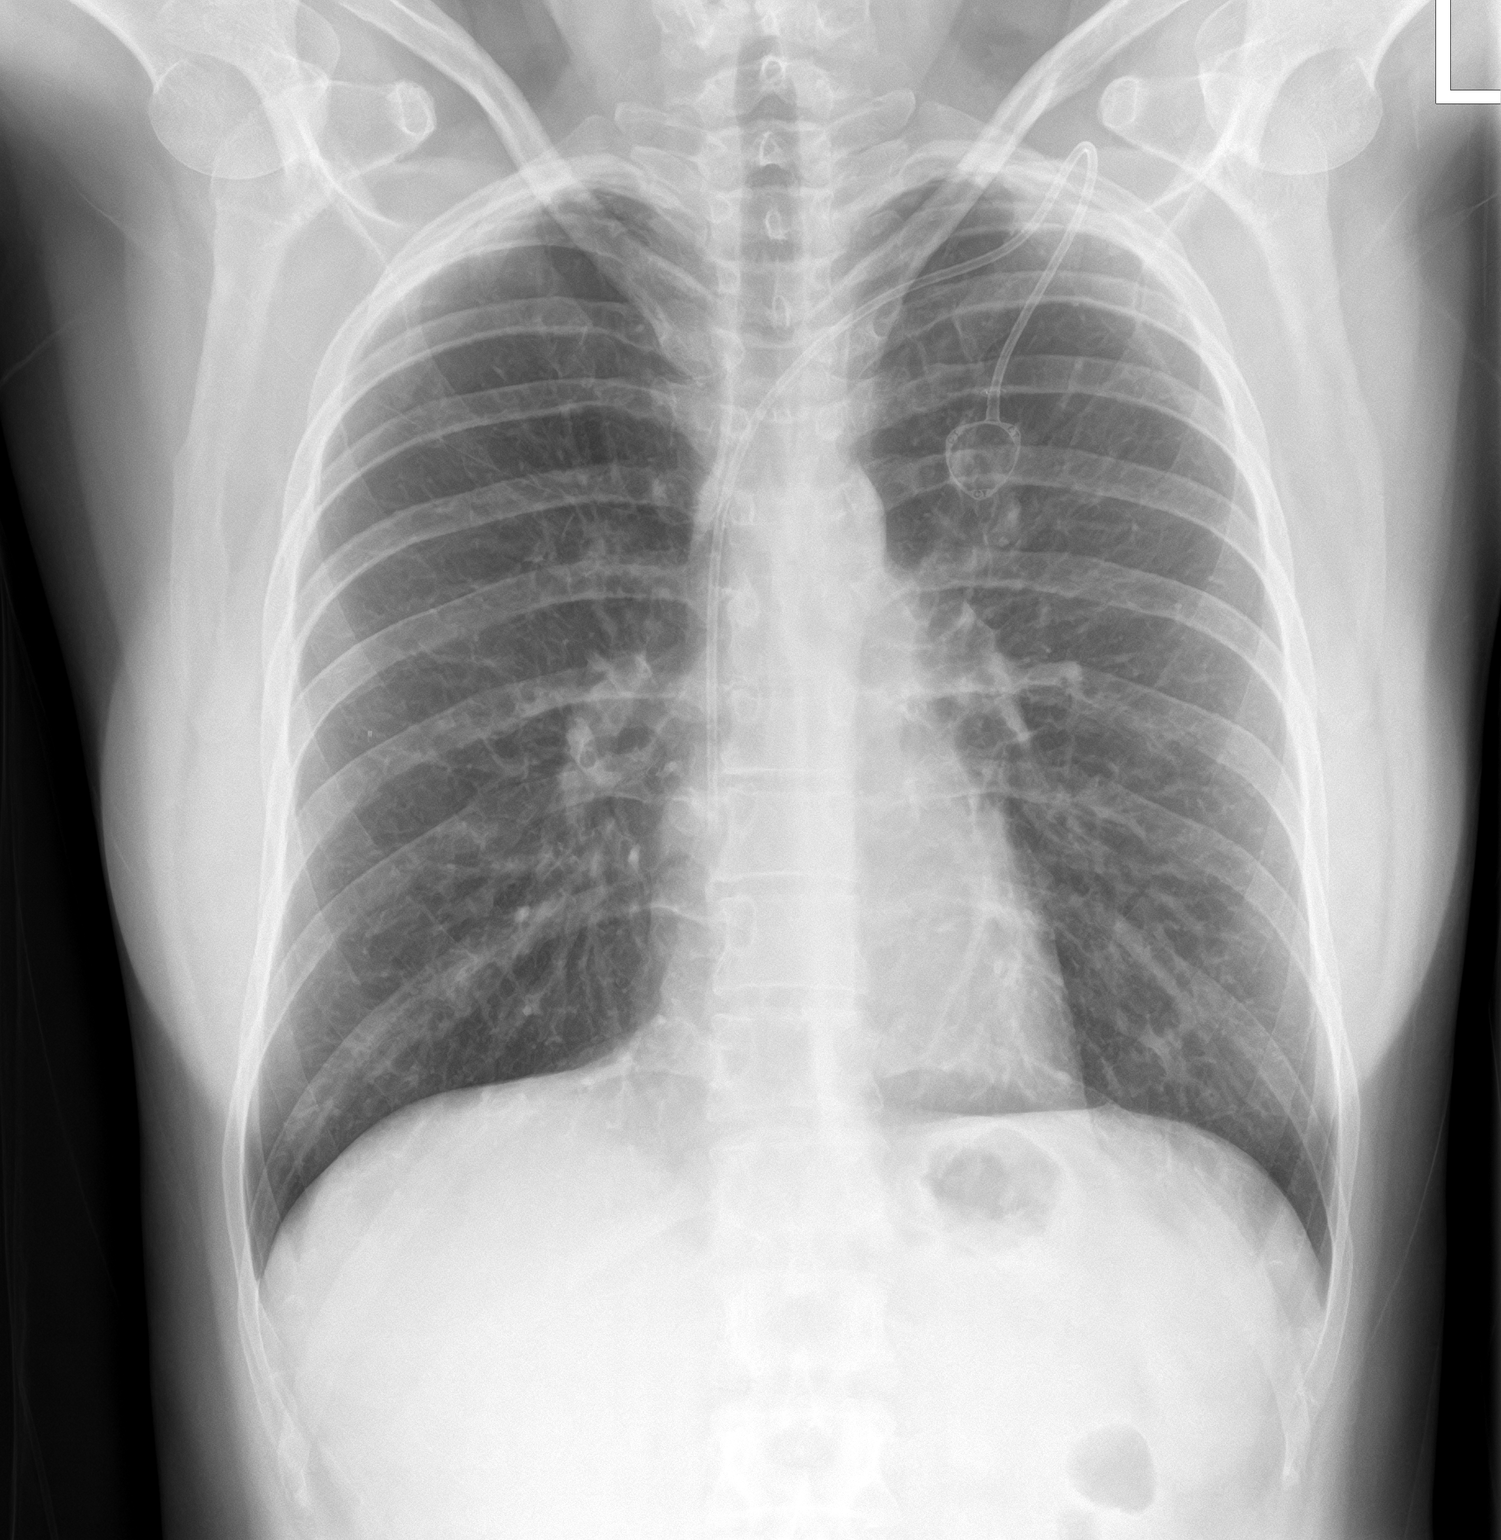
[im 2/2]
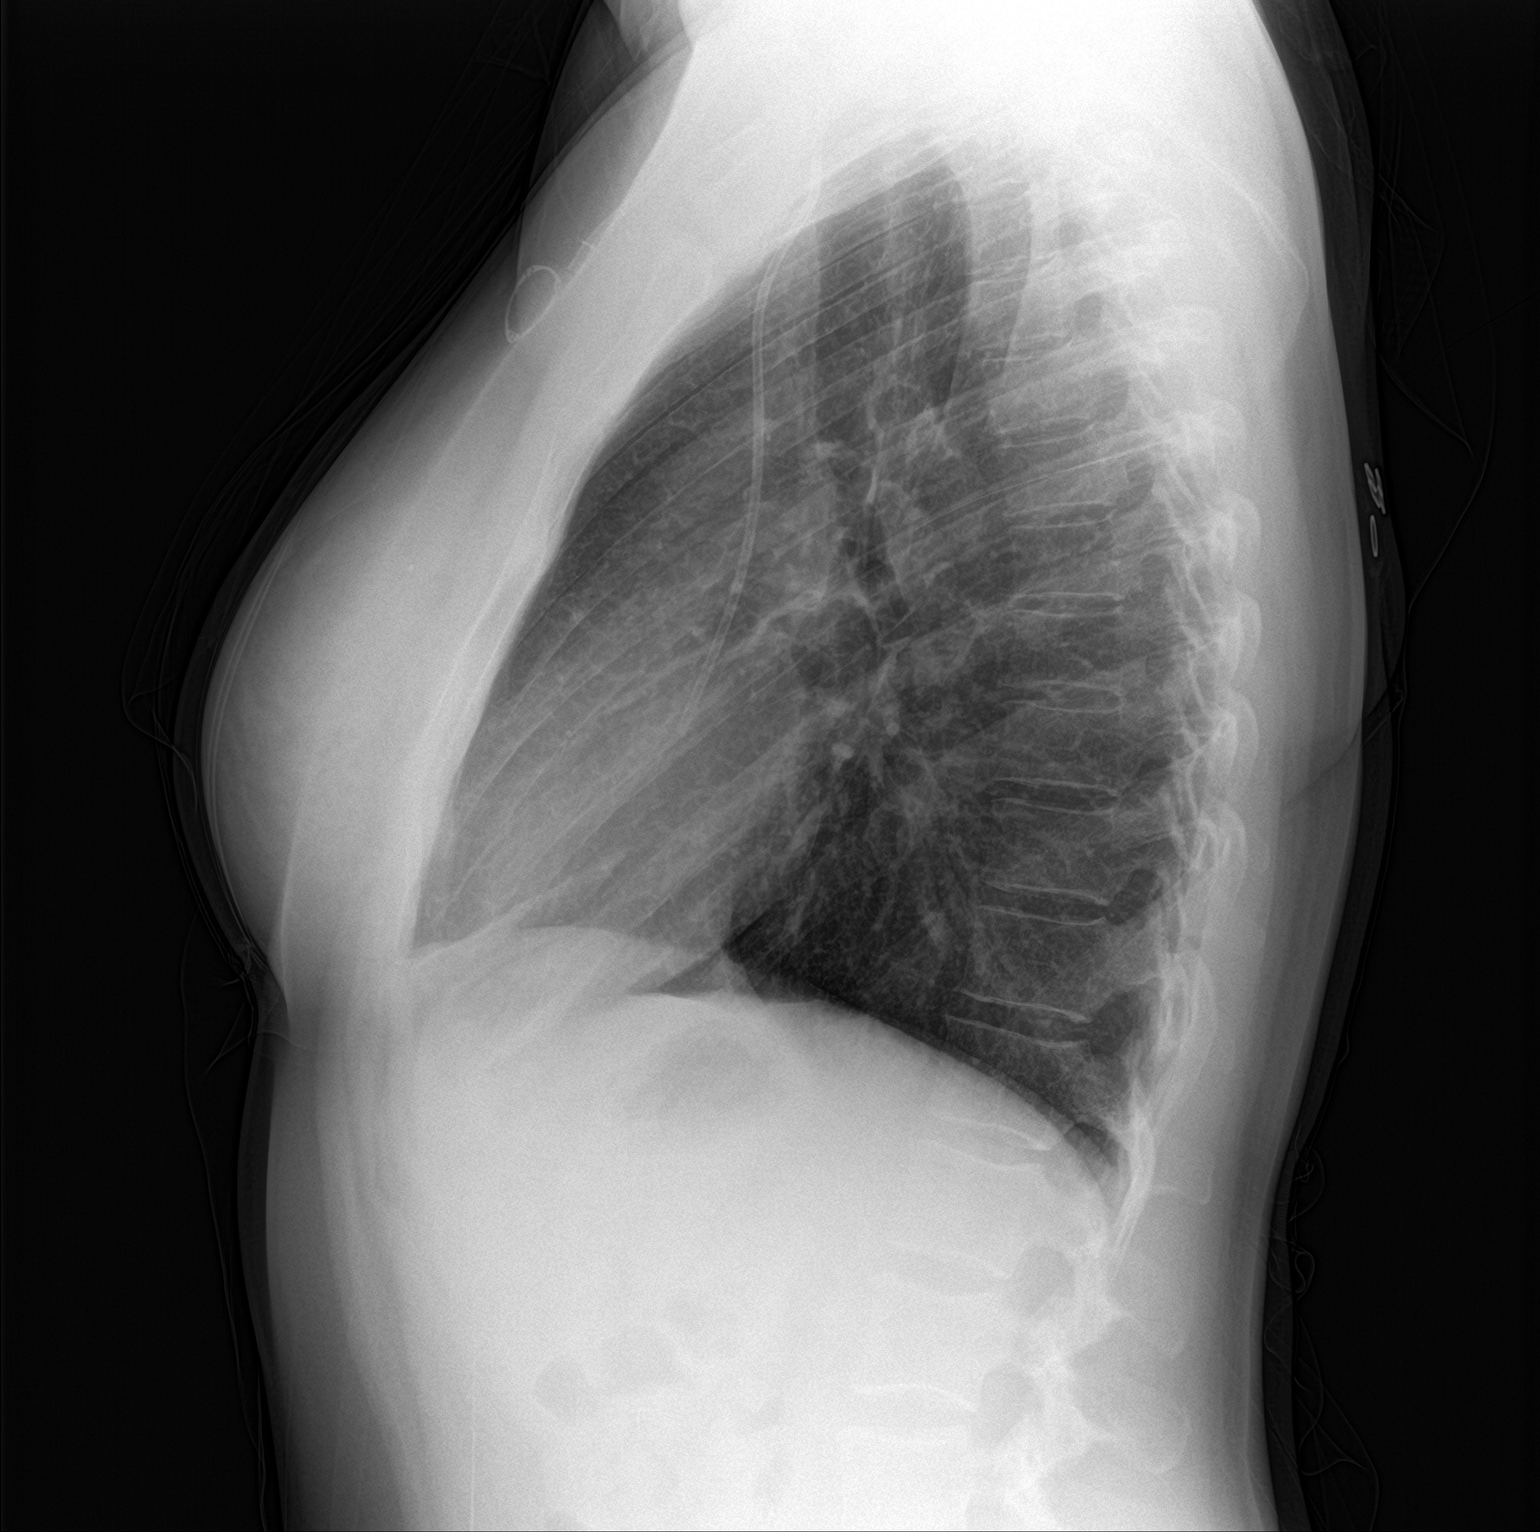

[2 of 2 positions shown; findings below may reference images not displayed]

FINDINGS: Normal sized heart. Clear lungs. The lungs are mildly hyperexpanded
mild diffuse peribronchial thickening. Left subclavian porta
catheter tip at the superior cavoatrial junction. Minimal lower
thoracic spine degenerative changes.
IMPRESSION: Mild changes of COPD and chronic bronchitis.  No acute abnormality.

## 2017-12-11 MED ORDER — HEPARIN SOD (PORK) LOCK FLUSH 100 UNIT/ML IV SOLN
INTRAVENOUS | Status: AC
  Administered 2017-12-11: 500 [IU]
  Filled 2017-12-11: qty 5

## 2017-12-11 MED ORDER — OSELTAMIVIR PHOSPHATE 75 MG PO CAPS
75.0000 mg | ORAL_CAPSULE | Freq: Once | ORAL | Status: AC
  Administered 2017-12-11: 75 mg via ORAL
  Filled 2017-12-11: qty 1

## 2017-12-11 MED ORDER — ACETAMINOPHEN 500 MG PO TABS
1000.0000 mg | ORAL_TABLET | Freq: Once | ORAL | Status: AC
  Administered 2017-12-11: 1000 mg via ORAL
  Filled 2017-12-11: qty 2

## 2017-12-11 MED ORDER — SODIUM CHLORIDE 0.9 % IV BOLUS (SEPSIS)
1000.0000 mL | Freq: Once | INTRAVENOUS | Status: AC
  Administered 2017-12-11: 1000 mL via INTRAVENOUS

## 2017-12-11 MED ORDER — OSELTAMIVIR PHOSPHATE 75 MG PO CAPS: 75.0000 mg | ORAL_CAPSULE | Freq: Two times a day (BID) | ORAL | 0 refills | Status: AC

## 2017-12-11 MED ORDER — KETOROLAC TROMETHAMINE 30 MG/ML IJ SOLN
15.0000 mg | Freq: Once | INTRAMUSCULAR | Status: AC
  Administered 2017-12-11: 15 mg via INTRAVENOUS
  Filled 2017-12-11: qty 1

## 2017-12-11 MED ORDER — ALBUTEROL SULFATE HFA 108 (90 BASE) MCG/ACT IN AERS: 2.0000 | INHALATION_SPRAY | Freq: Four times a day (QID) | RESPIRATORY_TRACT | 2 refills | Status: DC | PRN

## 2017-12-11 NOTE — Discharge Instructions (Signed)
Follow-up with PCP.  Be sure to drink plenty of fluids.  Return for worsening symptoms including inability to keep fluids down, shortness of breath chest pains or for any additional questions or concerns.  I would recommend contacting your children's pediatrician for flu prophylaxis.

## 2017-12-11 NOTE — ED Provider Notes (Signed)
[facility] Emergency Department Provider Note    First MD Initiated Contact with Patient 12/11/17 1444     (approximate)  I have reviewed the triage vital signs and the nursing notes.   HISTORY  Chief Complaint Fever    HPI Theresa Chaney is a 36 y.o. female strip breast cancer currently undergoing chemotherapy with last treatment last week presents with fever chills and body aches with nonproductive cough started this morning.  Patient was diagnosed with flu yesterday.  Denies any nausea or vomiting.  Denies any shortness of breath.  No recent antibiotic use.  Denies any dysuria.  No diarrhea.  Past Medical History:  Diagnosis Date  . Breast cancer ([place])   . Depression   . Family history of cancer   . GERD (gastroesophageal reflux disease)    Family History  Problem Relation Age of Onset  . Melanoma Maternal Aunt        other aunts with BCC/SCC/Melanoma  . Diabetes Father   . Hypertension Father   . Hyperlipidemia Father   . Bladder Cancer Maternal Grandmother   . Cervical cancer Maternal Aunt 64       daughter w/ cervical cancer as well  . Melanoma Maternal Uncle        other uncles with BCC/SCC/Melanoma   Past Surgical History:  Procedure Laterality Date  . BREAST BIOPSY Right 10/11/2017   12:00 posterior coil clip  . BREAST BIOPSY Right 10/11/2017   11:30 middle depth ribbon clip  . BREAST BIOPSY Right 10/11/2017   5:30 anterior depth x shape  . PORTACATH PLACEMENT Left 10/24/2017   Procedure: INSERTION PORT-A-CATH;  Surgeon: Robert Bellow, MD;  Location: [facility] ORS;  Service: General;  Laterality: Left;   Patient Active Problem List   Diagnosis Date Noted  . Family history of cancer   . Malignant neoplasm of overlapping sites of right breast in female, estrogen receptor positive ([place]) 10/19/2017      Prior to Admission medications   Medication Sig Start Date End Date Taking? Authorizing Provider  ALPRAZolam Duanne Moron) 0.25 MG  tablet Take 0.25 mg at bedtime as needed by mouth for anxiety or sleep.  10/10/17   [provider]  Calcium Carb-Cholecalciferol (CALCIUM 600 + D PO) Take 1 tablet daily by mouth.    [provider]  chlorhexidine (PERIDEX) 0.12 % solution USE AS DIRECTED 15MLS IN THE MOUTH OR THROAT TWICE A DAY 11/27/17   Earlie Server, MD  escitalopram (LEXAPRO) 20 MG tablet Take 20 mg daily by mouth. 07/31/17   [provider]  esomeprazole (NEXIUM) 40 MG capsule Take 40 mg daily by mouth. 12/26/14   [provider]  ibuprofen (ADVIL,MOTRIN) 800 MG tablet Take 800 mg every 8 (eight) hours as needed by mouth for moderate pain.    [provider]  lidocaine (XYLOCAINE) 2 % solution Use as directed 10 mLs in the mouth or throat every 4 (four) hours as needed for mouth pain. 12/01/17   Earlie Server, MD  lidocaine-prilocaine (EMLA) cream Apply to affected area once 11/08/17   Earlie Server, MD  Loratadine (CLARITIN) 10 MG CAPS Take by mouth.    [provider]  prochlorperazine (COMPAZINE) 10 MG tablet Take 1 tablet (10 mg total) by mouth every 6 (six) hours as needed (Nausea or vomiting). 11/08/17   Earlie Server, MD  promethazine (PHENERGAN) 25 MG tablet Take 1 tablet (25 mg total) by mouth every 6 (six) hours as needed for nausea or vomiting.  11/08/17   Earlie Server, MD  zolpidem (AMBIEN) 5 MG tablet Take 5 mg at bedtime by mouth. 10/16/17 11/15/17  [provider]    Allergies Patient has no known allergies.    Social History Social History   Tobacco Use  . Smoking status: Current Some Day Smoker    Packs/day: 1.00    Years: 5.00    Pack years: 5.00    Types: Cigarettes  . Smokeless tobacco: Never Used  Substance Use Topics  . Alcohol use: No    Frequency: Never  . Drug use: No    Review of Systems Patient denies headaches, rhinorrhea, blurry vision, numbness, shortness of breath, chest pain, edema, cough, abdominal pain, nausea, vomiting, diarrhea, dysuria,  fevers, rashes or hallucinations unless otherwise stated above in HPI. ____________________________________________   PHYSICAL EXAM:  VITAL SIGNS: Vitals:   12/11/17 1340  BP: 120/74  Pulse: (!) 108  Resp: 16  Temp: 99.1 F (37.3 C)  SpO2: 98%    Constitutional: Alert and oriented. ill appearing but in no acute distress. Eyes: Conjunctivae are normal.  Head: Atraumatic. Nose: No congestion/rhinnorhea. Mouth/Throat: Mucous membranes are moist.   Neck: No stridor. Painless ROM.  Cardiovascular: Normal rate, regular rhythm. Grossly normal heart sounds.  Good peripheral circulation. Respiratory: Normal respiratory effort.  No retractions. Lungs CTAB. Gastrointestinal: Soft and nontender. No distention. No abdominal bruits. No CVA tenderness. Genitourinary:  Musculoskeletal: No lower extremity tenderness nor edema.  No joint effusions. Neurologic:  Normal speech and language. No gross focal neurologic deficits are appreciated. No facial droop Skin:  Skin is warm, dry and intact. No rash noted. Psychiatric: Mood and affect are normal. Speech and behavior are normal.  ____________________________________________   LABS (all labs ordered are listed, but only abnormal results are displayed)  Results for orders placed or performed during the hospital encounter of 12/11/17 (from the past 24 hour(s))  Comprehensive metabolic panel     Status: Abnormal   Collection Time: 12/11/17  2:17 PM  Result Value Ref Range   Sodium 135 135 - 145 mmol/L   Potassium 3.2 (L) 3.5 - 5.1 mmol/L   Chloride 103 101 - 111 mmol/L   CO2 23 22 - 32 mmol/L   Glucose, Bld 107 (H) 65 - 99 mg/dL   BUN 13 6 - 20 mg/dL   Creatinine, Ser 0.41 (L) 0.44 - 1.00 mg/dL   Calcium 8.4 (L) 8.9 - 10.3 mg/dL   Total Protein 7.3 6.5 - 8.1 g/dL   Albumin 4.3 3.5 - 5.0 g/dL   AST 18 15 - 41 U/L   ALT 11 (L) 14 - 54 U/L   Alkaline Phosphatase 199 (H) 38 - 126 U/L   Total Bilirubin 0.8 0.3 - 1.2 mg/dL   GFR calc non Af  Amer >60 >60 mL/min   GFR calc Af Amer >60 >60 mL/min   Anion gap 9 5 - 15  Lactic acid, plasma     Status: None   Collection Time: 12/11/17  2:17 PM  Result Value Ref Range   Lactic Acid, Venous 0.9 0.5 - 1.9 mmol/L  CBC with Differential     Status: Abnormal   Collection Time: 12/11/17  2:17 PM  Result Value Ref Range   WBC 15.1 (H) 3.6 - 11.0 K/uL   RBC 3.46 (L) 3.80 - 5.20 MIL/uL   Hemoglobin 10.2 (L) 12.0 - 16.0 g/dL   HCT 30.1 (L) 35.0 - 47.0 %   MCV 86.8 80.0 - 100.0 fL  MCH 29.5 26.0 - 34.0 pg   MCHC 34.0 32.0 - 36.0 g/dL   RDW 14.1 11.5 - 14.5 %   Platelets 207 150 - 440 K/uL   Neutrophils Relative % 97 %   Lymphocytes Relative 2 %   Monocytes Relative 1 %   Eosinophils Relative 0 %   Basophils Relative 0 %   Neutro Abs 14.6 (H) 1.4 - 6.5 K/uL   Lymphs Abs 0.3 (L) 1.0 - 3.6 K/uL   Monocytes Absolute 0.2 0.2 - 0.9 K/uL   Eosinophils Absolute 0.0 0 - 0.7 K/uL   Basophils Absolute 0.0 0 - 0.1 K/uL  Protime-INR     Status: None   Collection Time: 12/11/17  2:17 PM  Result Value Ref Range   Prothrombin Time 13.5 11.4 - 15.2 seconds   INR 1.04   Influenza panel by PCR (type A & B)     Status: Abnormal   Collection Time: 12/11/17  2:17 PM  Result Value Ref Range   Influenza A By PCR POSITIVE (A) NEGATIVE   Influenza B By PCR NEGATIVE NEGATIVE   ____________________________________________  ____________________________________________  RADIOLOGY  I personally reviewed all radiographic images ordered to evaluate for the above acute complaints and reviewed radiology reports and findings.  These findings were personally discussed with the patient.  Please see medical record for radiology report.  ____________________________________________   PROCEDURES  Procedure(s) performed:  Procedures    Critical Care performed: no ____________________________________________   INITIAL IMPRESSION / ASSESSMENT AND PLAN / ED COURSE  Pertinent labs & imaging results  that were available during my care of the patient were reviewed by me and considered in my medical decision making (see chart for details).  DDX: Influenza, influenza-like illness, pneumonia, COPD, sepsis, UTI, bacteremia  Theresa Chaney is a 36 y.o. who presents to the ED with flulike illness testing positive for flu.  Patient with low-grade fever mild tachycardia and is ill-appearing but nontoxic.  Blood work shows no evidence of neutropenia.  Chest x-ray without consolidation patient without any hypoxia or significant shortness of breath.  Will provide IV fluids as well as Tamiflu as she is within that window and at high risk for flu associated complications.  The patient will be placed on continuous pulse oximetry and telemetry for monitoring.  Laboratory evaluation will be sent to evaluate for the above complaints.       Patient reassessed and updated on results of blood work.  No lactic acidosis.  At this point I do believe patient is stable and appropriate for outpatient management.  Discussed signs and symptoms for which she should return immediately to the hospital.  ____________________________________________   FINAL CLINICAL IMPRESSION(S) / ED DIAGNOSES  Final diagnoses:  Influenza A H1N1 infection  Cough  Myalgia      NEW MEDICATIONS STARTED DURING THIS VISIT:  This SmartLink is deprecated. Use AVSMEDLIST instead to display the medication list for a patient.   Note:  This document was prepared using Dragon voice recognition software and may include unintentional dictation errors.    Merlyn Lot, MD 12/11/17 [phone]

## 2017-12-11 NOTE — ED Triage Notes (Addendum)
Pt with fever, chills, body aches early this morning. Pt's last chemo tx was Wednesday, being treated for breast cancer. Pt took 2 tylenol at 11am, 101.4. Pt reports yellow, brown productive cough. Pt's husband was dx with flu yesterday.

## 2017-12-11 NOTE — ED Notes (Addendum)
Pt presents from home with fever today and chills/body aches, cough (sometimes productive). Pt states her husband had positive flu test yesterday. Her last chemo treatment was Wednesday. NAD noted.

## 2017-12-11 NOTE — Telephone Encounter (Signed)
Patient had DD [facility] 12/26, she is running fever of 101.4 this morning. Call returned to sister and advised that she go to ER for evaluation as the symptom management schedule is full. She states that patient husband has been running fever of 102 and was diagnosed with the flu yesterday.

## 2017-12-11 NOTE — ED Notes (Signed)
Pt discharged home after verbalizing understanding of discharge instructions; nad noted. 

## 2017-12-16 LAB — CULTURE, BLOOD (ROUTINE X 2)
Culture: NO GROWTH
Culture: NO GROWTH
SPECIAL REQUESTS: ADEQUATE
SPECIAL REQUESTS: ADEQUATE

## 2017-12-19 NOTE — Progress Notes (Signed)
Hematology/Oncology Follow Up Note [facility] Telephone:([phone] [phone]  Patient Care Team: Juluis Pitch, MD as PCP - General (Family Medicine)   Name of the patient: Theresa Chaney  [id number]  1981-04-03   Date of visit: 12/19/17 REASON FOR COSULTATION:  Treatment of  breast cancer  History of presenting illness 37 year old female with past medical history listed as below presented for evaluation of newly diagnosed right breast cancer. Patient self palpated a right breast mass 2 months ago during bathing. This triggered workup including mammogram and ultrasound. 10/05/2017 mammogram diagnostic breast tomo bilaterally showed multicentric area had a suspicion of breast cancer. 1  highly suspicious irregular mass with architectural distortion in the upper right breast at posterior depth measuring approximately 2 cm greatest dimension with associated architectural distortion and pleomorphic micro-calcifications. On ultrasound of vague hypoechoic masslike areas seen in the right breast at 11:30 o'clock axis. 2 second areas irregular hypoechoic mass in the right breast at the 11:30 o'clock axis, 6 cm from the nipple, at the superficial depth measuring 1.2 cm, likely correlate for a highly suspicious 1.4 cm extent of pleomorphic and a fine linear branching calcifications. 3 additional highly suspicious pleomorphic calcifications within the upper retroareolar right breast extending to this subareolar right breast, measuring 3.7 cm extending.  Right axilla was evaluated with ultrasound showing no enlarged or morphologically abnormal lymph nodes.  Patient underwent stereotactic biopsy, on 10/11/2017. Pathology showed A, carcinoma in situ intermediate to high grade with necrosis and calcifications at the right breast 11:30. B, right breast 5:30 subareolar, invasive carcinoma with diffusely infiltrative pattern in the lymphovascular invasion. In a background  of intermediate grade carcinoma in situ with necrosis and calcifications. C right breast 12:30, posterior common invasive mammary carcinoma of her type, associated with high-grade DCIS with necrosis and calcifications. Right breast 5:30 subareolar, ER +75%, PR negative HER-2 negative. Right breast 12:30 posterior, and ER +90%, and PR negative, and HER-2 negative.  Her aunt has a history of melanoma. . She is married, and she has a Psychiatrist and a biological daughter who is 83 years old. . She reports that she has a history of heart murmur which was diagnosed by primary care physician. She's never had any other health problems.   TREATMENT Neoadjuvant ddAC.  INTERVAL HISTORY Patient presents for evaluation of prior to cycle 4 neoadjuvant ddAC for breast cancer treatment.   Patient had flu and finished 5 days course of Tamiflu. She reports feeling much better although still having productive cough.  Denies fever chills, diarrhea, abd pain. She had a mouth sore and resolved. She uses Peridex mouthwash.   Review of Systems  Constitutional: Negative for fever, malaise/fatigue and weight loss.  HENT: Negative for ear pain, hearing loss and tinnitus.   Eyes: Negative for blurred vision, double vision and photophobia.  Respiratory: Positive for cough and sputum production. Negative for hemoptysis.   Cardiovascular: Negative for chest pain, palpitations and leg swelling.  Gastrointestinal: Negative for abdominal pain, heartburn, nausea and vomiting.  Genitourinary: Negative for dysuria, frequency and urgency.  Musculoskeletal: Negative for back pain, myalgias and neck pain.  Skin: Negative for itching and rash.  Neurological: Negative for dizziness, tingling, tremors and headaches.  Endo/Heme/Allergies: Negative for environmental allergies and polydipsia. Does not bruise/bleed easily.  Psychiatric/Behavioral: Negative for depression, hallucinations and substance abuse. The patient is not  nervous/anxious.     No Known Allergies  Patient Active Problem List   Diagnosis Date Noted  . Family history of cancer   .  Malignant neoplasm of overlapping sites of right breast in female, estrogen receptor positive ([place]) 10/19/2017     Past Medical History:  Diagnosis Date  . Breast cancer ([place])   . Depression   . Family history of cancer   . GERD (gastroesophageal reflux disease)      Past Surgical History:  Procedure Laterality Date  . BREAST BIOPSY Right 10/11/2017   12:00 posterior coil clip  . BREAST BIOPSY Right 10/11/2017   11:30 middle depth ribbon clip  . BREAST BIOPSY Right 10/11/2017   5:30 anterior depth x shape  . PORTACATH PLACEMENT Left 10/24/2017   Procedure: INSERTION PORT-A-CATH;  Surgeon: Robert Bellow, MD;  Location: [facility] ORS;  Service: General;  Laterality: Left;    Social History   Socioeconomic History  . Marital status: Married    Spouse name: Not on file  . Number of children: Not on file  . Years of education: Not on file  . Highest education level: Not on file  Social Needs  . Financial resource strain: Not on file  . Food insecurity - worry: Not on file  . Food insecurity - inability: Not on file  . Transportation needs - medical: Not on file  . Transportation needs - non-medical: Not on file  Occupational History  . Occupation: Occupational psychologist    Comment: Therapist, art   Tobacco Use  . Smoking status: Former Smoker    Packs/day: 1.00    Years: 5.00    Pack years: 5.00    Types: Cigarettes    Last attempt to quit: 12/12/2017    Years since quitting: 0.0  . Smokeless tobacco: Never Used  Substance and Sexual Activity  . Alcohol use: No    Frequency: Never  . Drug use: No  . Sexual activity: Yes    Birth control/protection: Injection  Other Topics Concern  . Not on file  Social History Narrative  . Not on file     Family History  Problem Relation Age of Onset  . Melanoma Maternal Aunt         other aunts with BCC/SCC/Melanoma  . Diabetes Father   . Hypertension Father   . Hyperlipidemia Father   . Bladder Cancer Maternal Grandmother   . Cervical cancer Maternal Aunt 64       daughter w/ cervical cancer as well  . Melanoma Maternal Uncle        other uncles with BCC/SCC/Melanoma     Current Outpatient Medications:  .  albuterol (PROVENTIL HFA;VENTOLIN HFA) 108 (90 Base) MCG/ACT inhaler, Inhale 2 puffs into the lungs every 6 (six) hours as needed for wheezing or shortness of breath (Cough)., Disp: 1 Inhaler, Rfl: 2 .  ALPRAZolam (XANAX) 0.25 MG tablet, Take 0.25 mg at bedtime as needed by mouth for anxiety or sleep. , Disp: , Rfl:  .  Calcium Carb-Cholecalciferol (CALCIUM 600 + D PO), Take 1 tablet daily by mouth., Disp: , Rfl:  .  chlorhexidine (PERIDEX) 0.12 % solution, USE AS DIRECTED 15MLS IN THE MOUTH OR THROAT TWICE A DAY, Disp: 473 mL, Rfl: 0 .  escitalopram (LEXAPRO) 20 MG tablet, Take 20 mg daily by mouth., Disp: , Rfl:  .  esomeprazole (NEXIUM) 40 MG capsule, Take 40 mg daily by mouth., Disp: , Rfl:  .  lidocaine (XYLOCAINE) 2 % solution, Use as directed 10 mLs in the mouth or throat every 4 (four) hours as needed for mouth pain., Disp: 200 mL, Rfl: 0 .  lidocaine-prilocaine (EMLA) cream, Apply to affected area once, Disp: 30 g, Rfl: 3 .  loratadine (CLARITIN) 10 MG tablet, Take 10 mg by mouth daily. , Disp: , Rfl:  .  prochlorperazine (COMPAZINE) 10 MG tablet, Take 1 tablet (10 mg total) by mouth every 6 (six) hours as needed (Nausea or vomiting)., Disp: 30 tablet, Rfl: 1 .  promethazine (PHENERGAN) 25 MG tablet, Take 1 tablet (25 mg total) by mouth every 6 (six) hours as needed for nausea or vomiting., Disp: 30 tablet, Rfl: 0 No current facility-administered medications for this visit.   Facility-Administered Medications Ordered in Other Visits:  .  0.9 %  sodium chloride infusion, , Intravenous, Once, Earlie Server, MD .  cyclophosphamide (CYTOXAN) 1,100 mg in sodium  chloride 0.9 % 250 mL chemo infusion, 600 mg/m2 (Treatment Plan Recorded), Intravenous, Once, Earlie Server, MD .  DOXOrubicin (ADRIAMYCIN) chemo injection 110 mg, 60 mg/m2 (Treatment Plan Recorded), Intravenous, Once, Earlie Server, MD .  fosaprepitant (EMEND) 150 mg, dexamethasone (DECADRON) 12 mg in sodium chloride 0.9 % 145 mL IVPB, , Intravenous, Once, Earlie Server, MD .  heparin lock flush 100 unit/mL, 500 Units, Intravenous, Once, Earlie Server, MD .  palonosetron (ALOXI) injection 0.25 mg, 0.25 mg, Intravenous, Once, Earlie Server, MD .  pegfilgrastim (NEULASTA ONPRO KIT) injection 6 mg, 6 mg, Subcutaneous, Once, Earlie Server, MD .  sodium chloride flush (NS) 0.9 % injection 10 mL, 10 mL, Intravenous, PRN, Earlie Server, MD, 10 mL at 12/20/17 0836   Physical exam:  Vitals:   12/20/17 0857  BP: 124/76  Pulse: 69  Resp: 16  Temp: 97.8 F (36.6 C)  TempSrc: Tympanic  Weight: 157 lb (71.2 kg)  ECOG 0 Physical Exam  Constitutional: She is oriented to person, place, and time and well-developed, well-nourished, and in no distress. No distress.  HENT:  Head: Normocephalic and atraumatic.  Mouth/Throat: No oropharyngeal exudate.  No thrush  Eyes: Conjunctivae and EOM are normal. Left eye exhibits no discharge. No scleral icterus.  Neck: Normal range of motion. Neck supple.  Cardiovascular: Normal rate, regular rhythm and normal heart sounds. Exam reveals no gallop and no friction rub.  No murmur heard. Pulmonary/Chest: Effort normal and breath sounds normal. No stridor. She has no wheezes. She has no rales. She exhibits no tenderness.  Abdominal: Soft. Bowel sounds are normal. She exhibits no distension and no mass. There is no tenderness. There is no guarding.  Musculoskeletal: Normal range of motion. She exhibits no edema.  Neurological: She is alert and oriented to person, place, and time. Coordination normal.  Skin: Skin is warm and dry. No rash noted.  Psychiatric: Mood, affect and judgment normal.  Breasts:  palpable right breast mass at 11:30, no skin or nipple changes or axillary nodes, no changes comparing to prior exam. Left breast no palpable mass or lymph node. Clinically I can not appreciate any changes of her breast mass size.     CMP Latest Ref Rng & Units 12/11/2017  Glucose 65 - 99 mg/dL 107(H)  BUN 6 - 20 mg/dL 13  Creatinine 0.44 - 1.00 mg/dL 0.41(L)  Sodium 135 - 145 mmol/L 135  Potassium 3.5 - 5.1 mmol/L 3.2(L)  Chloride 101 - 111 mmol/L 103  CO2 22 - 32 mmol/L 23  Calcium 8.9 - 10.3 mg/dL 8.4(L)  Total Protein 6.5 - 8.1 g/dL 7.3  Total Bilirubin 0.3 - 1.2 mg/dL 0.8  Alkaline Phos 38 - 126 U/L 199(H)  AST 15 - 41 U/L 18  ALT  14 - 54 U/L 11(L)   CBC Latest Ref Rng & Units 12/11/2017  WBC 3.6 - 11.0 K/uL 15.1(H)  Hemoglobin 12.0 - 16.0 g/dL 10.2(L)  Hematocrit 35.0 - 47.0 % 30.1(L)  Platelets 150 - 440 K/uL 207    Assessment and plan- Patient is a 37 y.o. female presents for evaluation of newly diagnosed multicentric right breast cancer.  cT2 cN0 cM0.   1. Malignant neoplasm of female breast, unspecified estrogen receptor status, unspecified laterality, unspecified site of breast ([place])   2. Malignant neoplasm of overlapping sites of right breast in female, estrogen receptor positive ([place])   3. Encounter for antineoplastic chemotherapy    # Urine HCG negative. proceed Cycle 4. ddAC with onpro  # Repeat diagnostic Mammogram to evaluate treatment response after cycle 4  # Cough, secondary to URI/flu,  improving. Advise using of OTC Mucinex as expectorant. # Advised patient to see GYN for recommendation of no hormone contraceptive measure. Discussed with patient that chemotherapy are teratogenic and discussed about avoiding pregnancy during chemotherapy # Antiemetics:Phenergan and Compazine; She has no nausea.  Peridex mouth rinse BID swish and spit.  All questions answered. Follow up on 2 week, CBC CMP prior to Taxol.  Thank you for this kind referral and the opportunity  to participate in the care of this patient  Earlie Server, MD, PhD Hematology [place] at [facility] Pager- [phone]

## 2017-12-20 ENCOUNTER — Inpatient Hospital Stay: Payer: BLUE CROSS/BLUE SHIELD | Attending: Oncology

## 2017-12-20 ENCOUNTER — Inpatient Hospital Stay (HOSPITAL_BASED_OUTPATIENT_CLINIC_OR_DEPARTMENT_OTHER): Payer: BLUE CROSS/BLUE SHIELD | Admitting: Oncology

## 2017-12-20 ENCOUNTER — Encounter: Payer: Self-pay | Admitting: Oncology

## 2017-12-20 ENCOUNTER — Inpatient Hospital Stay: Payer: BLUE CROSS/BLUE SHIELD

## 2017-12-20 ENCOUNTER — Other Ambulatory Visit: Payer: Self-pay | Admitting: *Deleted

## 2017-12-20 VITALS — BP 124/76 | HR 69 | Temp 97.8°F | Resp 16 | Wt 157.0 lb

## 2017-12-20 DIAGNOSIS — J069 Acute upper respiratory infection, unspecified: Secondary | ICD-10-CM | POA: Insufficient documentation

## 2017-12-20 DIAGNOSIS — C50811 Malignant neoplasm of overlapping sites of right female breast: Secondary | ICD-10-CM

## 2017-12-20 DIAGNOSIS — C50919 Malignant neoplasm of unspecified site of unspecified female breast: Secondary | ICD-10-CM

## 2017-12-20 DIAGNOSIS — Z170 Estrogen receptor positive status [ER+]: Secondary | ICD-10-CM

## 2017-12-20 DIAGNOSIS — J111 Influenza due to unidentified influenza virus with other respiratory manifestations: Secondary | ICD-10-CM | POA: Diagnosis not present

## 2017-12-20 DIAGNOSIS — Z5111 Encounter for antineoplastic chemotherapy: Secondary | ICD-10-CM | POA: Insufficient documentation

## 2017-12-20 DIAGNOSIS — Z87891 Personal history of nicotine dependence: Secondary | ICD-10-CM | POA: Insufficient documentation

## 2017-12-20 DIAGNOSIS — Z808 Family history of malignant neoplasm of other organs or systems: Secondary | ICD-10-CM | POA: Insufficient documentation

## 2017-12-20 DIAGNOSIS — R059 Cough, unspecified: Secondary | ICD-10-CM

## 2017-12-20 DIAGNOSIS — Z8049 Family history of malignant neoplasm of other genital organs: Secondary | ICD-10-CM

## 2017-12-20 DIAGNOSIS — J029 Acute pharyngitis, unspecified: Secondary | ICD-10-CM

## 2017-12-20 DIAGNOSIS — R05 Cough: Secondary | ICD-10-CM

## 2017-12-20 LAB — COMPREHENSIVE METABOLIC PANEL
ALK PHOS: 97 U/L (ref 38–126)
ALT: 19 U/L (ref 14–54)
ANION GAP: 10 (ref 5–15)
AST: 29 U/L (ref 15–41)
Albumin: 4.2 g/dL (ref 3.5–5.0)
BILIRUBIN TOTAL: 0.4 mg/dL (ref 0.3–1.2)
BUN: 11 mg/dL (ref 6–20)
CALCIUM: 9.2 mg/dL (ref 8.9–10.3)
CO2: 23 mmol/L (ref 22–32)
CREATININE: 0.57 mg/dL (ref 0.44–1.00)
Chloride: 103 mmol/L (ref 101–111)
GFR calc Af Amer: >60 mL/min (ref >60)
GFR calc non Af Amer: >60 mL/min (ref >60)
GLUCOSE: 121 mg/dL — AB (ref 65–99)
Potassium: 3.9 mmol/L (ref 3.5–5.1)
Sodium: 136 mmol/L (ref 135–145)
TOTAL PROTEIN: 7.4 g/dL (ref 6.5–8.1)

## 2017-12-20 LAB — CBC WITH DIFFERENTIAL/PLATELET
BASOS ABS: 0.1 10*3/uL (ref 0–0.1)
Basophils Relative: 1 %
Eosinophils Absolute: 0 10*3/uL (ref 0–0.7)
Eosinophils Relative: 0 %
HEMATOCRIT: 30.8 % — AB (ref 35.0–47.0)
HEMOGLOBIN: 10.6 g/dL — AB (ref 12.0–16.0)
LYMPHS PCT: 17 %
Lymphs Abs: 1.5 10*3/uL (ref 1.0–3.6)
MCH: 30.3 pg (ref 26.0–34.0)
MCHC: 34.3 g/dL (ref 32.0–36.0)
MCV: 88.2 fL (ref 80.0–100.0)
MONO ABS: 1.2 10*3/uL — AB (ref 0.2–0.9)
Monocytes Relative: 13 %
NEUTROS ABS: 6.4 10*3/uL (ref 1.4–6.5)
NEUTROS PCT: 69 %
Platelets: 281 10*3/uL (ref 150–440)
RBC: 3.49 MIL/uL — ABNORMAL LOW (ref 3.80–5.20)
RDW: 15.4 % — AB (ref 11.5–14.5)
WBC: 9.3 10*3/uL (ref 3.6–11.0)

## 2017-12-20 LAB — PREGNANCY, URINE: Preg Test, Ur: NEGATIVE

## 2017-12-20 MED ORDER — HEPARIN SOD (PORK) LOCK FLUSH 100 UNIT/ML IV SOLN
500.0000 [IU] | Freq: Once | INTRAVENOUS | Status: AC
  Administered 2017-12-20: 500 [IU] via INTRAVENOUS
  Filled 2017-12-20: qty 5

## 2017-12-20 MED ORDER — PALONOSETRON HCL INJECTION 0.25 MG/5ML
0.2500 mg | Freq: Once | INTRAVENOUS | Status: AC
  Administered 2017-12-20: 0.25 mg via INTRAVENOUS
  Filled 2017-12-20: qty 5

## 2017-12-20 MED ORDER — SODIUM CHLORIDE 0.9 % IV SOLN
600.0000 mg/m2 | Freq: Once | INTRAVENOUS | Status: AC
  Administered 2017-12-20: 1100 mg via INTRAVENOUS
  Filled 2017-12-20: qty 50

## 2017-12-20 MED ORDER — SODIUM CHLORIDE 0.9% FLUSH
10.0000 mL | INTRAVENOUS | Status: DC | PRN
  Administered 2017-12-20: 10 mL via INTRAVENOUS
  Filled 2017-12-20: qty 10

## 2017-12-20 MED ORDER — SODIUM CHLORIDE 0.9 % IV SOLN
Freq: Once | INTRAVENOUS | Status: AC
  Administered 2017-12-20: 10:00:00 via INTRAVENOUS
  Filled 2017-12-20: qty 1000

## 2017-12-20 MED ORDER — PEGFILGRASTIM 6 MG/0.6ML ~~LOC~~ PSKT
6.0000 mg | PREFILLED_SYRINGE | Freq: Once | SUBCUTANEOUS | Status: AC
  Administered 2017-12-20: 6 mg via SUBCUTANEOUS
  Filled 2017-12-20: qty 0.6

## 2017-12-20 MED ORDER — FOSAPREPITANT DIMEGLUMINE INJECTION 150 MG
Freq: Once | INTRAVENOUS | Status: AC
  Administered 2017-12-20: 10:00:00 via INTRAVENOUS
  Filled 2017-12-20: qty 5

## 2017-12-20 MED ORDER — DOXORUBICIN HCL CHEMO IV INJECTION 2 MG/ML
60.0000 mg/m2 | Freq: Once | INTRAVENOUS | Status: AC
  Administered 2017-12-20: 110 mg via INTRAVENOUS
  Filled 2017-12-20: qty 55

## 2017-12-21 ENCOUNTER — Other Ambulatory Visit: Payer: Self-pay | Admitting: *Deleted

## 2017-12-21 DIAGNOSIS — Z170 Estrogen receptor positive status [ER+]: Principal | ICD-10-CM

## 2017-12-21 DIAGNOSIS — C50811 Malignant neoplasm of overlapping sites of right female breast: Secondary | ICD-10-CM

## 2017-12-26 ENCOUNTER — Ambulatory Visit
Admission: RE | Admit: 2017-12-26 | Discharge: 2017-12-26 | Disposition: A | Discharge status: Home / Self Care | Payer: BLUE CROSS/BLUE SHIELD | Source: Ambulatory Visit | Attending: Oncology | Admitting: Oncology

## 2017-12-26 DIAGNOSIS — C50811 Malignant neoplasm of overlapping sites of right female breast: Secondary | ICD-10-CM

## 2017-12-26 DIAGNOSIS — D0511 Intraductal carcinoma in situ of right breast: Secondary | ICD-10-CM | POA: Diagnosis not present

## 2017-12-26 DIAGNOSIS — Z170 Estrogen receptor positive status [ER+]: Principal | ICD-10-CM

## 2017-12-26 HISTORY — DX: Personal history of antineoplastic chemotherapy: Z92.21

## 2017-12-26 IMAGING — US US BREAST*R* LIMITED INC AXILLA
1 series · 8 of 8 positions shown · non-contrast
Comparison: [DATE] mammograms and ultrasound

CLINICAL DATA: 36-year-old female for evaluation of neoadjuvant
chemotherapy response to known right breast DCIS and invasive
mammary carcinoma.

EXAM:
2D DIGITAL DIAGNOSTIC RIGHT MAMMOGRAM WITH CAD AND ADJUNCT TOMO
ULTRASOUND RIGHT BREAST

[Series 1: us breast*right* limited inc axilla · 0.07mm/px · 8 of 8 slices shown]
[im 1/8]
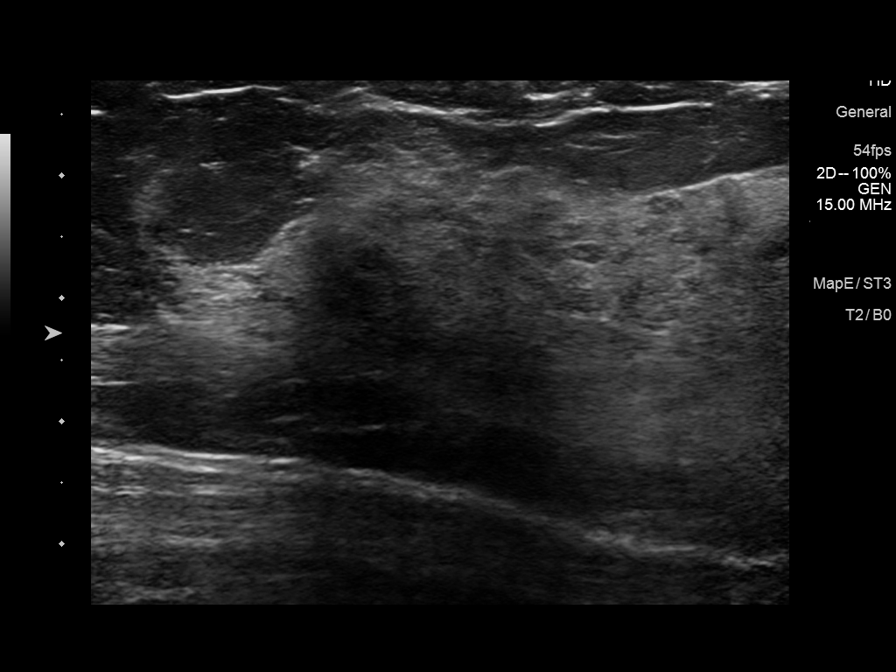
[im 2/8]
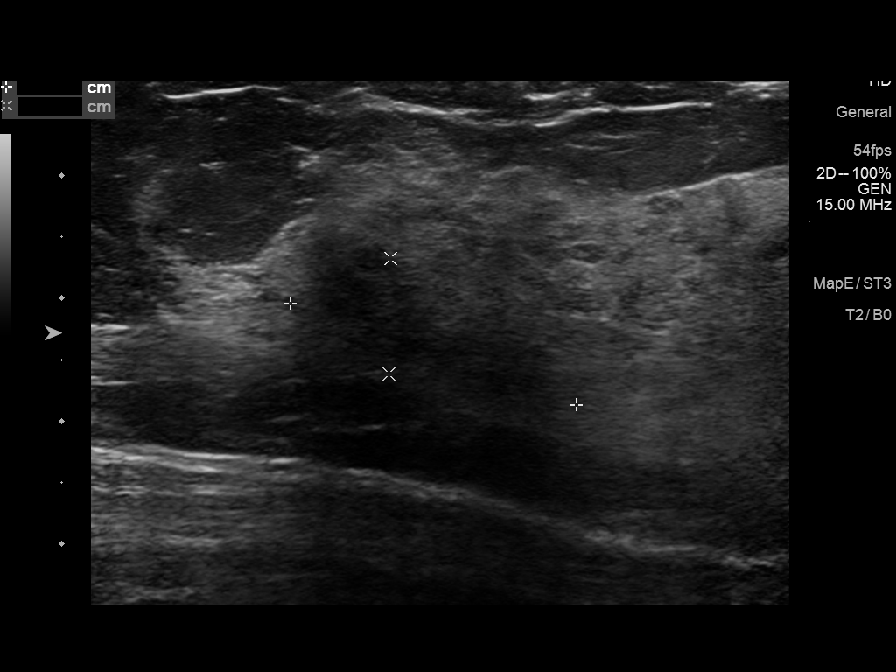
[im 3/8]
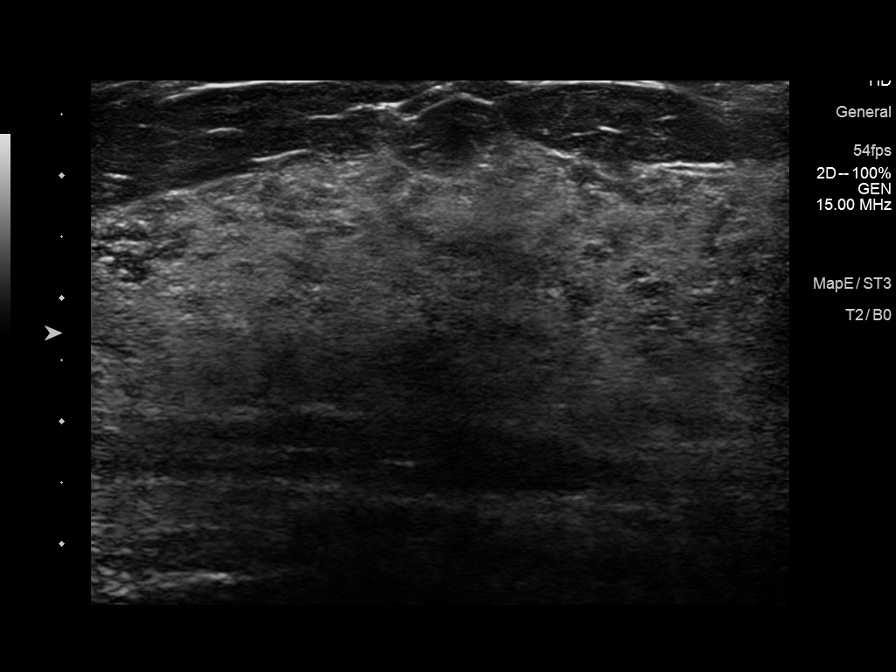
[im 4/8]
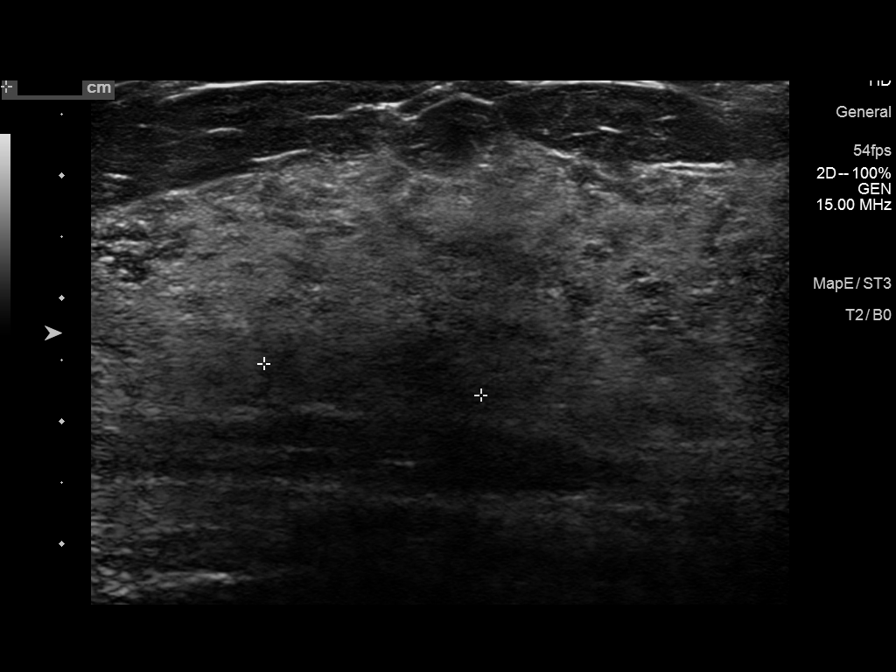
[im 5/8]
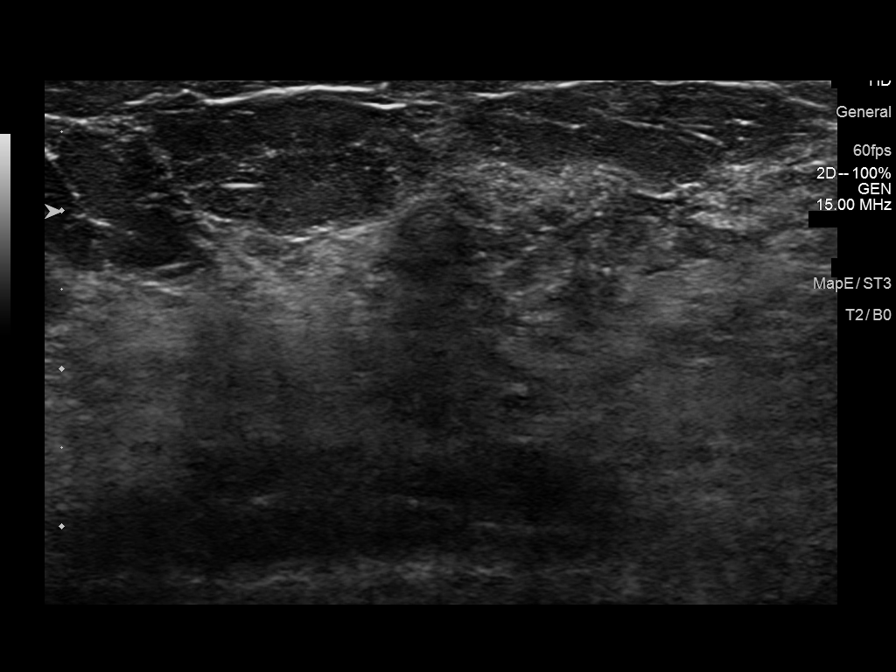
[im 6/8]
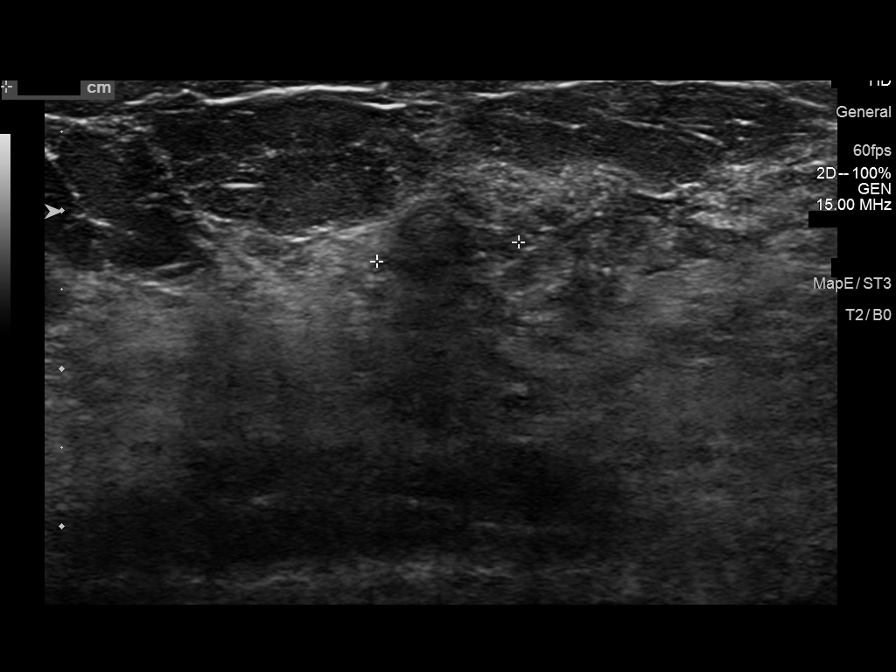
[im 7/8]
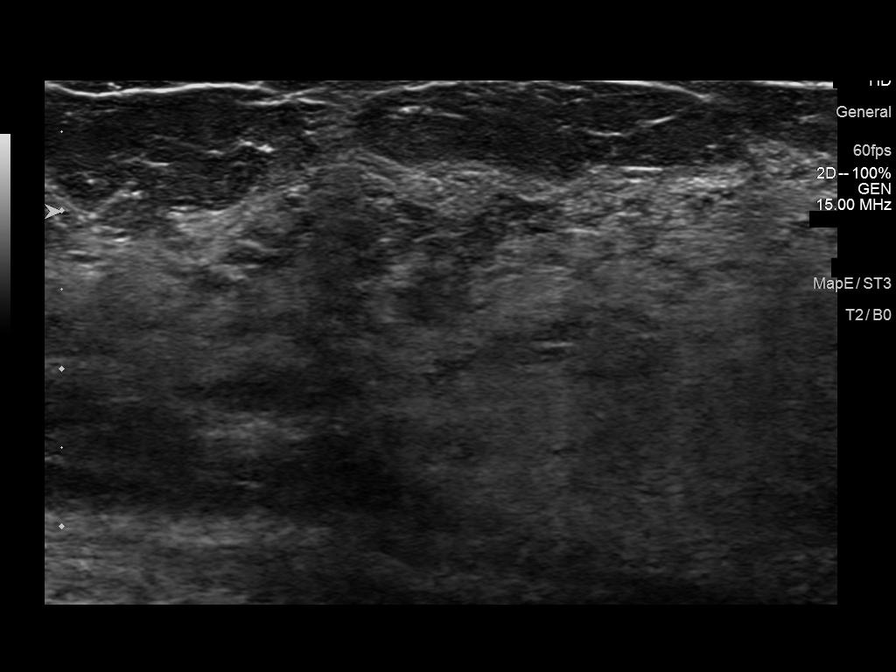
[im 8/8]
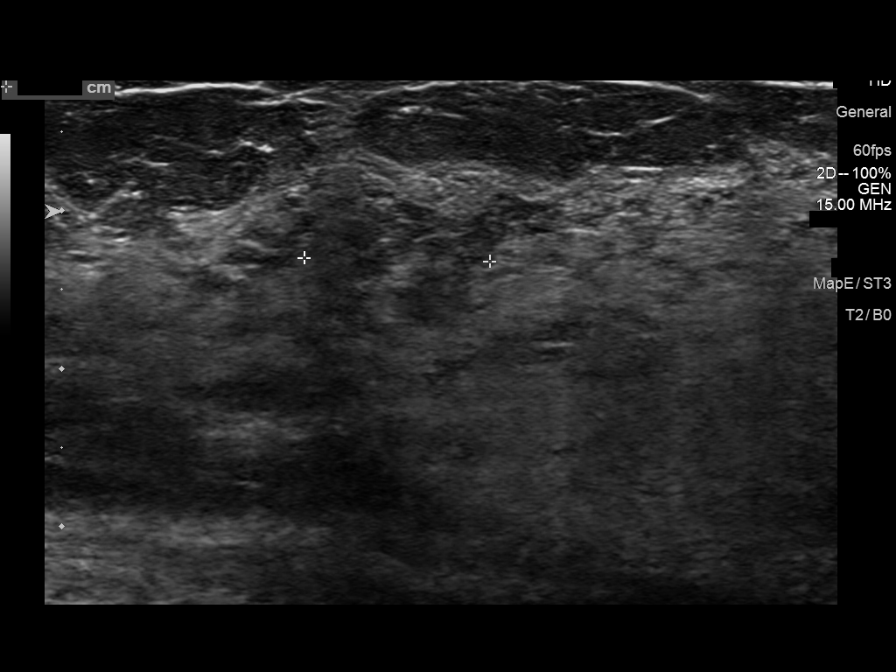

[8 of 8 positions shown; findings below may reference images not displayed]

ACR Breast Density Category c: The breast tissue is heterogeneously
dense, which may obscure small masses.
FINDINGS: 2D and 3D full field views of the right breast demonstrate slightly
more conspicuous biopsy-proven DCIS calcifications throughout the
upper right breast spanning a distance of 5 x 6.5 x 6.5 cm
(transverse X AP X CC). The calcifications are more conspicuous than
on prior mammograms, but the size of the area affected is unchanged.

Mammographic images were processed with CAD.

Targeted ultrasound is performed, showing a 2.5 x 1.3 x 1.8 cm
ill-defined hypoechoic area in the posterior right breast at the
[DATE] position 5 cm from the nipple, previously measuring 2.6 x
x 2.3 cm. Given difficulty in imaging this area, it is felt that
this mass is stable to slightly decreased in size.

A 1.2 x 1.1 x 0.9 cm ill-defined hypoechoic area in the superficial
aspect of the right breast at the [DATE] position 6 cm from the
nipple previously measured 1.1 x 1.2 x 0.7 cm.
IMPRESSION: 1. Slightly more conspicuous biopsy-proven DCIS calcifications
throughout the upper right breast but unchanged size of area
involved measuring 5 x 6.5 x 6.5 cm. Treatment response is difficult
to assess as these calcifications indicate intraductal necrosis and
may be secondary to treatment response or unchanged/slightly
progressive malignancy.
2. Relatively unchanged size of masses/malignancy within the upper
slightly outer right breast as described.

RECOMMENDATION:
Treatment plan

I have discussed the findings and recommendations with the patient.
Results were also provided in writing at the conclusion of the
visit. If applicable, a reminder letter will be sent to the patient
regarding the next appointment.

BI-RADS CATEGORY  6: Known biopsy-proven malignancy.

## 2018-01-02 NOTE — Progress Notes (Signed)
Hematology/Oncology Follow Up Note [facility] Telephone:([phone] [phone]  Patient Care Team: Juluis Pitch, MD as PCP - General (Family Medicine)   Name of the patient: Theresa Chaney  [id number]  1981/09/05   Date of visit: 01/02/18 REASON FOR COSULTATION:  Treatment of  breast cancer  History of presenting illness 37 year old female with past medical history listed as below presented for evaluation of newly diagnosed right breast cancer. Patient self palpated a right breast mass 2 months ago during bathing. This triggered workup including mammogram and ultrasound. 10/05/2017 mammogram diagnostic breast tomo bilaterally showed multicentric area had a suspicion of breast cancer. 1  highly suspicious irregular mass with architectural distortion in the upper right breast at posterior depth measuring approximately 2 cm greatest dimension with associated architectural distortion and pleomorphic micro-calcifications. On ultrasound of vague hypoechoic masslike areas seen in the right breast at 11:30 o'clock axis. 2 second areas irregular hypoechoic mass in the right breast at the 11:30 o'clock axis, 6 cm from the nipple, at the superficial depth measuring 1.2 cm, likely correlate for a highly suspicious 1.4 cm extent of pleomorphic and a fine linear branching calcifications. 3 additional highly suspicious pleomorphic calcifications within the upper retroareolar right breast extending to this subareolar right breast, measuring 3.7 cm extending.  Right axilla was evaluated with ultrasound showing no enlarged or morphologically abnormal lymph nodes.  Patient underwent stereotactic biopsy, on 10/11/2017. Pathology showed A, carcinoma in situ intermediate to high grade with necrosis and calcifications at the right breast 11:30. B, right breast 5:30 subareolar, invasive carcinoma with diffusely infiltrative pattern in the lymphovascular invasion. In a background  of intermediate grade carcinoma in situ with necrosis and calcifications. C right breast 12:30, posterior common invasive mammary carcinoma of her type, associated with high-grade DCIS with necrosis and calcifications. Right breast 5:30 subareolar, ER +75%, PR negative HER-2 negative. Right breast 12:30 posterior, and ER +90%, and PR negative, and HER-2 negative.  Her aunt has a history of melanoma. . She is married, and she has a Psychiatrist and a biological daughter who is 16 years old. . She reports that she has a history of heart murmur which was diagnosed by primary care physician. She's never had any other health problems.   TREATMENT Neoadjuvant ddAC. And weekly Taxol.   INTERVAL HISTORY Patient presents for evaluation of prior to cycle 1 neoadjuvant Taxol for breast cancer treatment.   Patient feels well today. The only complaints she has is that she continues to have a dry cough and also sore throat. She has a history of acid reflux and takes Nexium. She eats spicy food.   Denies fever chills, diarrhea, abd pain. She uses Peridex mouthwash.   Review of Systems  Constitutional: Negative for fever, malaise/fatigue and weight loss.  HENT: Negative for ear pain, hearing loss and tinnitus.        Sore throat  Eyes: Negative for blurred vision, double vision and photophobia.  Respiratory: Positive for cough. Negative for hemoptysis and sputum production.   Cardiovascular: Negative for chest pain, palpitations and leg swelling.  Gastrointestinal: Negative for abdominal pain, heartburn, nausea and vomiting.  Genitourinary: Negative for dysuria, frequency and urgency.  Musculoskeletal: Negative for back pain, myalgias and neck pain.  Skin: Negative for itching and rash.  Neurological: Negative for dizziness, tremors, speech change and headaches.  Endo/Heme/Allergies: Negative for environmental allergies and polydipsia. Does not bruise/bleed easily.  Psychiatric/Behavioral: Negative for  depression, hallucinations and substance abuse. The patient is not nervous/anxious.  No Known Allergies  Patient Active Problem List   Diagnosis Date Noted  . Family history of cancer   . Malignant neoplasm of overlapping sites of right breast in female, estrogen receptor positive ([place]) 10/19/2017     Past Medical History:  Diagnosis Date  . Breast cancer ([place])   . Depression   . Family history of cancer   . GERD (gastroesophageal reflux disease)   . Personal history of chemotherapy    current for right breast ca     Past Surgical History:  Procedure Laterality Date  . BREAST BIOPSY Right 10/11/2017   12:30 posterior coil clip invasive mammary carcinoma  . BREAST BIOPSY Right 10/11/2017   11:30 middle depth ribbon clip DCIS  . BREAST BIOPSY Right 10/11/2017   5:30 anterior depth x shape invasive ductal carcinoma  . PORTACATH PLACEMENT Left 10/24/2017   Procedure: INSERTION PORT-A-CATH;  Surgeon: Robert Bellow, MD;  Location: [facility] ORS;  Service: General;  Laterality: Left;    Social History   Socioeconomic History  . Marital status: Married    Spouse name: Not on file  . Number of children: Not on file  . Years of education: Not on file  . Highest education level: Not on file  Social Needs  . Financial resource strain: Not on file  . Food insecurity - worry: Not on file  . Food insecurity - inability: Not on file  . Transportation needs - medical: Not on file  . Transportation needs - non-medical: Not on file  Occupational History  . Occupation: Occupational psychologist    Comment: Therapist, art   Tobacco Use  . Smoking status: Former Smoker    Packs/day: 1.00    Years: 5.00    Pack years: 5.00    Types: Cigarettes    Last attempt to quit: 12/12/2017    Years since quitting: 0.0  . Smokeless tobacco: Never Used  Substance and Sexual Activity  . Alcohol use: No    Frequency: Never  . Drug use: No  . Sexual activity: Yes    Birth  control/protection: Injection  Other Topics Concern  . Not on file  Social History Narrative  . Not on file     Family History  Problem Relation Age of Onset  . Melanoma Maternal Aunt        other aunts with BCC/SCC/Melanoma  . Diabetes Father   . Hypertension Father   . Hyperlipidemia Father   . Bladder Cancer Maternal Grandmother   . Cervical cancer Maternal Aunt 64       daughter w/ cervical cancer as well  . Melanoma Maternal Uncle        other uncles with BCC/SCC/Melanoma     Current Outpatient Medications:  .  albuterol (PROVENTIL HFA;VENTOLIN HFA) 108 (90 Base) MCG/ACT inhaler, Inhale 2 puffs into the lungs every 6 (six) hours as needed for wheezing or shortness of breath (Cough)., Disp: 1 Inhaler, Rfl: 2 .  ALPRAZolam (XANAX) 0.25 MG tablet, Take 0.25 mg at bedtime as needed by mouth for anxiety or sleep. , Disp: , Rfl:  .  Calcium Carb-Cholecalciferol (CALCIUM 600 + D PO), Take 1 tablet daily by mouth., Disp: , Rfl:  .  chlorhexidine (PERIDEX) 0.12 % solution, USE AS DIRECTED 15MLS IN THE MOUTH OR THROAT TWICE A DAY, Disp: 473 mL, Rfl: 0 .  escitalopram (LEXAPRO) 20 MG tablet, Take 20 mg daily by mouth., Disp: , Rfl:  .  esomeprazole (NEXIUM) 40 MG capsule,  Take 40 mg daily by mouth., Disp: , Rfl:  .  lidocaine (XYLOCAINE) 2 % solution, Use as directed 10 mLs in the mouth or throat every 4 (four) hours as needed for mouth pain., Disp: 200 mL, Rfl: 0 .  lidocaine-prilocaine (EMLA) cream, Apply to affected area once, Disp: 30 g, Rfl: 3 .  loratadine (CLARITIN) 10 MG tablet, Take 10 mg by mouth daily. , Disp: , Rfl:  .  prochlorperazine (COMPAZINE) 10 MG tablet, Take 1 tablet (10 mg total) by mouth every 6 (six) hours as needed (Nausea or vomiting)., Disp: 30 tablet, Rfl: 1 .  promethazine (PHENERGAN) 25 MG tablet, Take 1 tablet (25 mg total) by mouth every 6 (six) hours as needed for nausea or vomiting., Disp: 30 tablet, Rfl: 0   Physical exam:  Vitals:   01/03/18 0834    BP: 112/71  Pulse: 78  Resp: 14  Temp: 97.6 F (36.4 C)  TempSrc: Tympanic  Weight: 159 lb (72.1 kg)  ECOG 0 Physical Exam  Constitutional: She is oriented to person, place, and time and well-developed, well-nourished, and in no distress. No distress.  HENT:  Head: Normocephalic and atraumatic.  Mouth/Throat: Oropharynx is clear and moist. No oropharyngeal exudate.  No thrush,  Eyes: Conjunctivae and EOM are normal. Left eye exhibits no discharge. No scleral icterus.  Neck: Normal range of motion. Neck supple.  Cardiovascular: Normal rate, regular rhythm and normal heart sounds.  No murmur heard. Pulmonary/Chest: Effort normal and breath sounds normal. No respiratory distress.  Abdominal: Soft. Bowel sounds are normal. She exhibits no distension. There is no tenderness.  Musculoskeletal: Normal range of motion. She exhibits no tenderness.  Lymphadenopathy:    She has no cervical adenopathy.  Neurological: She is alert and oriented to person, place, and time. No cranial nerve deficit.  Skin: Skin is warm and dry. No erythema.  Psychiatric: Mood, affect and judgment normal.  Breasts: palpable right breast mass at 11:30, no skin or nipple changes or axillary nodes, no changes comparing to prior exam. Left breast no palpable mass or lymph node. Clinically I can not appreciate any changes of her breast mass size.     CMP Latest Ref Rng & Units 12/20/2017  Glucose 65 - 99 mg/dL 121(H)  BUN 6 - 20 mg/dL 11  Creatinine 0.44 - 1.00 mg/dL 0.57  Sodium 135 - 145 mmol/L 136  Potassium 3.5 - 5.1 mmol/L 3.9  Chloride 101 - 111 mmol/L 103  CO2 22 - 32 mmol/L 23  Calcium 8.9 - 10.3 mg/dL 9.2  Total Protein 6.5 - 8.1 g/dL 7.4  Total Bilirubin 0.3 - 1.2 mg/dL 0.4  Alkaline Phos 38 - 126 U/L 97  AST 15 - 41 U/L 29  ALT 14 - 54 U/L 19   CBC Latest Ref Rng & Units 12/20/2017  WBC 3.6 - 11.0 K/uL 9.3  Hemoglobin 12.0 - 16.0 g/dL 10.6(L)  Hematocrit 35.0 - 47.0 % 30.8(L)  Platelets 150 - 440  K/uL 281    Assessment and plan- Patient is a 37 y.o. female presents for evaluation of newly diagnosed multicentric right breast cancer.  cT2 cN0 cM0.   1. Malignant neoplasm of overlapping sites of right breast in female, estrogen receptor positive ([place])   2. Encounter for antineoplastic chemotherapy   3. Gastroesophageal reflux disease, esophagitis presence not specified   4. Anemia due to antineoplastic chemotherapy    # Urine HCG negative. Proceed weekly Taxol.  # Repeat diagnostic Mammogram showed stable disease. Will present  her case on Tumor board.  # Advise patient to avoid spicy food, continue take Nexium 40 mg daily. If no improving can increase to 64m.  Elevate head while sleeping (two pillows) # Advised patient to see GYN for recommendation of no hormone contraceptive measure. Discussed with patient that chemotherapy are teratogenic and discussed about avoiding pregnancy during chemotherapy # Antiemetics:Phenergan and Compazine; She has no nausea.  Peridex mouth rinse BID swish and spit.  # Anemia due to chemotherapy. Stable Hb All questions answered. Follow up on 1 week CBC CMP prior to Taxol.  Thank you for this kind referral and the opportunity to participate in the care of this patient  ZEarlie Server MD, PhD Hematology O[place]bridgeat AWarm Springs Rehabilitation Hospital Of San AntonioPager- [phone]/23/19

## 2018-01-03 ENCOUNTER — Inpatient Hospital Stay: Payer: BLUE CROSS/BLUE SHIELD

## 2018-01-03 ENCOUNTER — Inpatient Hospital Stay (HOSPITAL_BASED_OUTPATIENT_CLINIC_OR_DEPARTMENT_OTHER): Payer: BLUE CROSS/BLUE SHIELD | Admitting: Oncology

## 2018-01-03 ENCOUNTER — Encounter: Payer: Self-pay | Admitting: Oncology

## 2018-01-03 VITALS — BP 112/71 | HR 78 | Temp 97.6°F | Resp 14 | Wt 159.0 lb

## 2018-01-03 VITALS — BP 111/75 | HR 80 | Resp 18

## 2018-01-03 DIAGNOSIS — C50919 Malignant neoplasm of unspecified site of unspecified female breast: Secondary | ICD-10-CM

## 2018-01-03 DIAGNOSIS — Z8049 Family history of malignant neoplasm of other genital organs: Secondary | ICD-10-CM

## 2018-01-03 DIAGNOSIS — C50811 Malignant neoplasm of overlapping sites of right female breast: Secondary | ICD-10-CM

## 2018-01-03 DIAGNOSIS — Z170 Estrogen receptor positive status [ER+]: Secondary | ICD-10-CM | POA: Diagnosis not present

## 2018-01-03 DIAGNOSIS — T451X5A Adverse effect of antineoplastic and immunosuppressive drugs, initial encounter: Secondary | ICD-10-CM

## 2018-01-03 DIAGNOSIS — Z87891 Personal history of nicotine dependence: Secondary | ICD-10-CM

## 2018-01-03 DIAGNOSIS — K219 Gastro-esophageal reflux disease without esophagitis: Secondary | ICD-10-CM

## 2018-01-03 DIAGNOSIS — D6481 Anemia due to antineoplastic chemotherapy: Secondary | ICD-10-CM

## 2018-01-03 DIAGNOSIS — Z5111 Encounter for antineoplastic chemotherapy: Secondary | ICD-10-CM

## 2018-01-03 DIAGNOSIS — R05 Cough: Secondary | ICD-10-CM

## 2018-01-03 DIAGNOSIS — Z808 Family history of malignant neoplasm of other organs or systems: Secondary | ICD-10-CM

## 2018-01-03 LAB — COMPREHENSIVE METABOLIC PANEL
ALK PHOS: 83 U/L (ref 38–126)
ALT: 15 U/L (ref 14–54)
ANION GAP: 6 (ref 5–15)
AST: 24 U/L (ref 15–41)
Albumin: 4.4 g/dL (ref 3.5–5.0)
BUN: 13 mg/dL (ref 6–20)
CALCIUM: 9.1 mg/dL (ref 8.9–10.3)
CO2: 27 mmol/L (ref 22–32)
CREATININE: 0.59 mg/dL (ref 0.44–1.00)
Chloride: 105 mmol/L (ref 101–111)
Glucose, Bld: 100 mg/dL — ABNORMAL HIGH (ref 65–99)
Potassium: 4 mmol/L (ref 3.5–5.1)
SODIUM: 138 mmol/L (ref 135–145)
Total Bilirubin: 0.4 mg/dL (ref 0.3–1.2)
Total Protein: 7.5 g/dL (ref 6.5–8.1)

## 2018-01-03 LAB — CBC WITH DIFFERENTIAL/PLATELET
BASOS ABS: 0.1 10*3/uL (ref 0–0.1)
BASOS PCT: 1 %
EOS ABS: 0 10*3/uL (ref 0–0.7)
Eosinophils Relative: 0 %
HEMATOCRIT: 29.3 % — AB (ref 35.0–47.0)
HEMOGLOBIN: 10.1 g/dL — AB (ref 12.0–16.0)
Lymphocytes Relative: 19 %
Lymphs Abs: 1.4 10*3/uL (ref 1.0–3.6)
MCH: 31.3 pg (ref 26.0–34.0)
MCHC: 34.6 g/dL (ref 32.0–36.0)
MCV: 90.4 fL (ref 80.0–100.0)
Monocytes Absolute: 1.2 10*3/uL — ABNORMAL HIGH (ref 0.2–0.9)
Monocytes Relative: 17 %
NEUTROS ABS: 4.5 10*3/uL (ref 1.4–6.5)
NEUTROS PCT: 63 %
Platelets: 240 10*3/uL (ref 150–440)
RBC: 3.23 MIL/uL — AB (ref 3.80–5.20)
RDW: 19 % — ABNORMAL HIGH (ref 11.5–14.5)
WBC: 7.1 10*3/uL (ref 3.6–11.0)

## 2018-01-03 LAB — PREGNANCY, URINE: PREG TEST UR: NEGATIVE

## 2018-01-03 MED ORDER — SODIUM CHLORIDE 0.9% FLUSH
10.0000 mL | INTRAVENOUS | Status: DC | PRN
  Administered 2018-01-03: 10 mL via INTRAVENOUS
  Filled 2018-01-03: qty 10

## 2018-01-03 MED ORDER — PACLITAXEL CHEMO INJECTION 300 MG/50ML: 80.0000 mg/m2 | Freq: Once | INTRAVENOUS | Status: DC

## 2018-01-03 MED ORDER — FAMOTIDINE IN NACL 20-0.9 MG/50ML-% IV SOLN
20.0000 mg | Freq: Once | INTRAVENOUS | Status: AC
  Administered 2018-01-03: 20 mg via INTRAVENOUS
  Filled 2018-01-03: qty 50

## 2018-01-03 MED ORDER — DOCUSATE SODIUM 100 MG PO CAPS: 100.0000 mg | ORAL_CAPSULE | Freq: Two times a day (BID) | ORAL | 0 refills | Status: DC

## 2018-01-03 MED ORDER — HEPARIN SOD (PORK) LOCK FLUSH 100 UNIT/ML IV SOLN
500.0000 [IU] | Freq: Once | INTRAVENOUS | Status: AC
  Administered 2018-01-03: 500 [IU] via INTRAVENOUS
  Filled 2018-01-03: qty 5

## 2018-01-03 MED ORDER — SODIUM CHLORIDE 0.9 % IV SOLN
20.0000 mg | Freq: Once | INTRAVENOUS | Status: AC
  Administered 2018-01-03: 20 mg via INTRAVENOUS
  Filled 2018-01-03: qty 2

## 2018-01-03 MED ORDER — CHLORHEXIDINE GLUCONATE 0.12 % MT SOLN: OROMUCOSAL | 1 refills | Status: DC

## 2018-01-03 MED ORDER — DIPHENHYDRAMINE HCL 50 MG/ML IJ SOLN
50.0000 mg | Freq: Once | INTRAMUSCULAR | Status: AC
  Administered 2018-01-03: 50 mg via INTRAVENOUS
  Filled 2018-01-03: qty 1

## 2018-01-03 MED ORDER — SODIUM CHLORIDE 0.9 % IV SOLN
80.0000 mg/m2 | Freq: Once | INTRAVENOUS | Status: AC
  Administered 2018-01-03: 144 mg via INTRAVENOUS
  Filled 2018-01-03 (×2): qty 24

## 2018-01-03 MED ORDER — SODIUM CHLORIDE 0.9 % IV SOLN
Freq: Once | INTRAVENOUS | Status: AC
  Administered 2018-01-03: 09:00:00 via INTRAVENOUS
  Filled 2018-01-03: qty 1000

## 2018-01-09 ENCOUNTER — Telehealth: Payer: Self-pay | Admitting: Oncology

## 2018-01-09 NOTE — Telephone Encounter (Signed)
Patient's case with discussed on 01/08/2018 breast tumor conference. Post ddAC treatment mammogram showed stable disease, no decrease of disease site. Consensus was agreed on proceed to surgery.  I have discussed with patient over the phone about the recommendation from breast tumor conference. She agrees with the plan. She is interested in bilateral mastectomy. She has had genetic testing and did not reveal any pathogenic mutation. Prophylactic mastectomy of contralateral side is a shared decision making between patient and surgeon. I encourage patient to discuss with Dr.Byrrnett.   RN navigator Vita Barley was updated on our phone discussion. Vita Barley will contact Dr.Byrnett and arrange patient's surgical plan.

## 2018-01-10 ENCOUNTER — Encounter: Payer: Self-pay | Admitting: *Deleted

## 2018-01-10 ENCOUNTER — Inpatient Hospital Stay: Payer: BLUE CROSS/BLUE SHIELD

## 2018-01-10 ENCOUNTER — Inpatient Hospital Stay: Payer: BLUE CROSS/BLUE SHIELD | Admitting: Oncology

## 2018-01-10 NOTE — Progress Notes (Signed)
  Oncology Nurse Navigator Documentation  Navigator Location: [facility] (01/10/18 0800)   )                          Barriers/Navigation Needs: Coordination of Care (01/10/18 0800)   Interventions: Coordination of Care (01/10/18 0800)   Coordination of Care: Appts (01/10/18 0800)                  Time Spent with Patient: 15 (01/10/18 0800)   Spoke with Dr. Tasia Catchings yesterday.  She has stopped her neoadjuvant chemotherapy and referred her back to surgery at this time.  Dr. Tasia Catchings asked that talk with the patient who desires bilateral mastectomy at this time and inform Dr. Bary Castilla of patient's plan.  I have called the patient this morning and confirmed that she desires bilateral mastectomy with reconstruction.  Informed Dr. Dwyane Luo office of patient's plan.  She has been scheduled to see him on 01/16/18 @ 3:15.

## 2018-01-16 ENCOUNTER — Encounter: Payer: Self-pay | Admitting: General Surgery

## 2018-01-16 ENCOUNTER — Ambulatory Visit (INDEPENDENT_AMBULATORY_CARE_PROVIDER_SITE_OTHER): Payer: BLUE CROSS/BLUE SHIELD | Admitting: General Surgery

## 2018-01-16 VITALS — BP 116/76 | HR 88 | Resp 12 | Ht 67.0 in | Wt 161.0 lb

## 2018-01-16 DIAGNOSIS — C50811 Malignant neoplasm of overlapping sites of right female breast: Secondary | ICD-10-CM | POA: Diagnosis not present

## 2018-01-16 DIAGNOSIS — Z170 Estrogen receptor positive status [ER+]: Secondary | ICD-10-CM | POA: Diagnosis not present

## 2018-01-16 NOTE — Patient Instructions (Addendum)
The patient is aware to call back for any questions or new concerns.  The patient is scheduled to see Dr Marla Roe on 02/09/18 at 1:15 pm. The patient is aware of date, time, and instructions.

## 2018-01-16 NOTE — Progress Notes (Signed)
Patient ID: Theresa Chaney, female   DOB: [birth year], 37 y.o.   MRN: [id number]  Chief Complaint  Patient presents with  . Other    HPI Theresa Chaney is a 37 y.o. female.  Here to discuss surgery bilateral mastectomies with reconstruciton. She states chemotherapy has not been as effective. She states she will need more chemotherapy after surgery.  Right breast mammogram and ultrasound was 12-26-17.   Bra size 38 B.  Appointment with Dr [name] was 01-11-18.  Patient is interested in hysterectomy and removal of ovaries.  She is here with her sister in law, Theresa Chaney.   HPI  Past Medical History:  Diagnosis Date  . BRCA negative 11/26/2017  . Breast cancer ([place])   . Depression   . Family history of cancer   . GERD (gastroesophageal reflux disease)   . Personal history of chemotherapy    current for right breast ca    Past Surgical History:  Procedure Laterality Date  . BREAST BIOPSY Right 10/11/2017   12:30 posterior coil clip invasive mammary carcinoma  . BREAST BIOPSY Right 10/11/2017   11:30 middle depth ribbon clip DCIS  . BREAST BIOPSY Right 10/11/2017   5:30 anterior depth x shape invasive ductal carcinoma  . PORTACATH PLACEMENT Left 10/24/2017   Procedure: INSERTION PORT-A-CATH;  Surgeon: Robert Bellow, MD;  Location: [facility] ORS;  Service: General;  Laterality: Left;    Family History  Problem Relation Age of Onset  . Melanoma Maternal Aunt        other aunts with BCC/SCC/Melanoma  . Diabetes Father   . Hypertension Father   . Hyperlipidemia Father   . Bladder Cancer Maternal Grandmother   . Cervical cancer Maternal Aunt 64       daughter w/ cervical cancer as well  . Melanoma Maternal Uncle        other uncles with BCC/SCC/Melanoma    Social History Social History   Tobacco Use  . Smoking status: Former Smoker    Packs/day: 1.00    Years: 5.00    Pack years: 5.00    Types: Cigarettes    Last attempt to quit: 12/12/2017    Years since quitting: 0.0   . Smokeless tobacco: Never Used  Substance Use Topics  . Alcohol use: No    Frequency: Never  . Drug use: No    No Known Allergies  Current Outpatient Medications  Medication Sig Dispense Refill  . albuterol (PROVENTIL HFA;VENTOLIN HFA) 108 (90 Base) MCG/ACT inhaler Inhale 2 puffs into the lungs every 6 (six) hours as needed for wheezing or shortness of breath (Cough). 1 Inhaler 2  . ALPRAZolam (XANAX) 0.25 MG tablet Take 0.25 mg at bedtime as needed by mouth for anxiety or sleep.     . Calcium Carb-Cholecalciferol (CALCIUM 600 + D PO) Take 1 tablet daily by mouth.    . chlorhexidine (PERIDEX) 0.12 % solution USE AS DIRECTED 15MLS IN THE MOUTH OR THROAT TWICE A DAY 473 mL 1  . docusate sodium (COLACE) 100 MG capsule Take 1 capsule (100 mg total) by mouth 2 (two) times daily. 60 capsule 0  . escitalopram (LEXAPRO) 20 MG tablet Take 20 mg daily by mouth.    . esomeprazole (NEXIUM) 40 MG capsule Take 40 mg daily by mouth.    . lidocaine (XYLOCAINE) 2 % solution Use as directed 10 mLs in the mouth or throat every 4 (four) hours as needed for mouth pain. 200 mL 0  . lidocaine-prilocaine (EMLA) cream Apply  to affected area once 30 g 3  . loratadine (CLARITIN) 10 MG tablet Take 10 mg by mouth daily.     . prochlorperazine (COMPAZINE) 10 MG tablet Take 1 tablet (10 mg total) by mouth every 6 (six) hours as needed (Nausea or vomiting). 30 tablet 1  . promethazine (PHENERGAN) 25 MG tablet Take 1 tablet (25 mg total) by mouth every 6 (six) hours as needed for nausea or vomiting. 30 tablet 0   No current facility-administered medications for this visit.     Review of Systems Review of Systems  Constitutional: Negative.   Respiratory: Negative.   Cardiovascular: Negative.     Blood pressure 116/76, pulse 88, resp. rate 12, height _0  (1.702 m), weight 161 lb (73 kg).  Physical Exam Physical Exam  Constitutional: She is oriented to person, place, and time. She appears well-developed and  well-nourished.  HENT:  Mouth/Throat: Oropharynx is clear and moist.  Eyes: Conjunctivae are normal. No scleral icterus.  Neck: Neck supple.  Cardiovascular: Normal rate, regular rhythm and normal heart sounds.  Pulmonary/Chest: Effort normal and breath sounds normal. Right breast exhibits no inverted nipple, no mass, no nipple discharge, no skin change and no tenderness. Left breast exhibits no inverted nipple, no mass, no nipple discharge, no skin change and no tenderness.    Lymphadenopathy:    She has no cervical adenopathy.    She has no axillary adenopathy.  Neurological: She is alert and oriented to person, place, and time.  Skin: Skin is warm and dry.  Psychiatric: Her behavior is normal.    Data Reviewed Ultrasound examination of October 05, 2017 showed a 1.5 x 2.4 x 2.6 cm hypoechoic mass corresponding to the dominant mass on mammography. Repeat ultrasound dated December 26, 2017 after initiation of neoadjuvant chemotherapy showed the dominant mass in the upper outer quadrant measuring 1.3 x 1.8 x 2.5 cm.    Assessment    No appreciable change in the right breast mass after neoadjuvant chemotherapy.    Plan    The patient came stating that she desired bilateral mastectomy with immediate reconstruction.  We had a long discussion regarding likelihood of contralateral disease, probably in the low single digits, as well as the increased morbidity from bilateral rather than unilateral breast surgery especially when adjuvant chemotherapy is planned post surgery.  The recommendation from the [place] of breast surgeons against prophylactic mastectomy was discussed.  Potential for prolonged delays in reinstituting adjuvant chemotherapy should she have wound healing issues was reviewed.  The importance of evaluation by plastic surgery prior to surgical intervention was discussed.  Options for surgical reconstruction were reviewed, but the importance of formal plastic  surgery consultation was emphasized.  The patient has spoken to her gynecologist about the possibility of having a hysterectomy and ovarian removal at the time of surgery.  In speaking with Dr. [name], I have recommended that it would be reasonable to consider laparoscopic salpingo-oophorectomy at the time of unilateral breast surgery, but violation of the vagina or cervix for a total hysterectomy would be an appropriate for the risk of infection.  Should the patient desired to proceed with bilateral surgery, I would not recommend a prolonged procedure even with ovarian removal at the same setting.  The patient's questions were answered regarding options for management of the nondiseased breast, and I think she is at least open to the idea of unilateral mastectomy with reconstruction.  Arrangements will be made for plastic surgery consultation and further follow-up based on  that discussion.  .     HPI, Physical Exam, Assessment and Plan have been scribed under the direction and in the presence of Robert Bellow, MD. Karie Fetch, RN  I have completed the exam and reviewed the above documentation for accuracy and completeness.  I agree with the above.  Haematologist has been used and any errors in dictation or transcription are unintentional.  Hervey Ard, M.D., F.A.C.S.  Forest Gleason Destiney Sanabia 01/17/2018, 8:11 PM

## 2018-02-07 ENCOUNTER — Other Ambulatory Visit: Payer: Self-pay | Admitting: *Deleted

## 2018-02-07 ENCOUNTER — Telehealth: Payer: Self-pay | Admitting: *Deleted

## 2018-02-07 DIAGNOSIS — Z170 Estrogen receptor positive status [ER+]: Principal | ICD-10-CM

## 2018-02-07 DIAGNOSIS — C50811 Malignant neoplasm of overlapping sites of right female breast: Secondary | ICD-10-CM

## 2018-02-07 NOTE — Telephone Encounter (Signed)
Patient called back and states 02-28-18 is okay for surgery but wants to see if she can have Dr. Leafy Chaney remove her ovaries at the same time. Will double check to make sure this is okay with Dr. Bary Castilla first and then work on scheduling accordingly. Patient aware date may need to be changed.

## 2018-02-07 NOTE — Telephone Encounter (Signed)
Message left for patient to call the office.   Wanting to see if 02-28-18 will work for the patient's surgery with Dr. Bary Castilla and Dr. Marla Roe.

## 2018-02-07 NOTE — Telephone Encounter (Signed)
-----   Message from Robert Bellow, MD sent at 02/06/2018  1:59 PM EST ----- Spoke with Dr. Marla Roe. Will schedule for right mastectomy and SLN biopsy follow by immediate reconstruction.

## 2018-02-08 NOTE — Telephone Encounter (Signed)
Patient's surgery has been scheduled for 03-19-18 to allow for Dr. Marla Roe and Dr. [name] to do surgery at the same time as Dr. Bary Castilla.   The patient has been scheduled for a pre-op visit with Dr. Bary Castilla.   She was instructed to call the office should she have further questions. Patient verbalizes understnding.

## 2018-02-27 ENCOUNTER — Encounter: Payer: Self-pay | Admitting: Oncology

## 2018-03-02 ENCOUNTER — Telehealth: Payer: Self-pay | Admitting: Oncology

## 2018-03-02 NOTE — Telephone Encounter (Signed)
Discussed with patient's GYN Dr. [name] will inform the patient that who informed me that patient request bilateral oophorectomy.  Given patient's age Dr. [name] feels that the benefit overweighs the risks.  I agree with Dr. Migdalia Dk opinion.  Patient can have pharmacological ways for ovarian suppression by having Pegram agonist was usually given the monthly subQ injection.  Surgical removal of bilateral ovaries are not the only option for ovarian suppression.  Per Dr. Migdalia Dk request, I called patient today and discussed with her.  I discussed with her about other option of ovarian suppression, including pharmacological and radiation ablation.  Patient tells me that she feels strong about having bilateral ovary removed so that she does not need to worry about taking medication for it.  I explained to patient that even if she has bilateral ovary removal, she will still need taking aromatase inhibitor as her adjuvant hormone treatment which will be started after she complete her surgery, adjuvant chemotherapy and radiation.  And also explained that pharmacological ovarian suppression is not permanent.  Once she complete her adjuvant hormonal treatment, she may regain her ovarian function.  Bilateral ovary removal will put her to permanent menopause and she may have severe complications from early menopause, such as bone loss/osteoporosis, pathological fracture etc. Patient still feels strong about surgical removal of her ovaries.  I tell her that this will be her preference if she decides to pursue surgical options.  She voices understanding.  Currently she is scheduled to have bilateral oophorectomy done in combination with her mastectomy surgery.  She understands that if she changes her mind she knew to call Dr. [name] and update her.  Called Dr.Beasley and updated her.

## 2018-03-08 ENCOUNTER — Encounter: Payer: Self-pay | Admitting: General Surgery

## 2018-03-08 ENCOUNTER — Ambulatory Visit (INDEPENDENT_AMBULATORY_CARE_PROVIDER_SITE_OTHER): Payer: BLUE CROSS/BLUE SHIELD | Admitting: General Surgery

## 2018-03-08 VITALS — BP 120/78 | HR 80 | Resp 14 | Ht 66.0 in | Wt 171.0 lb

## 2018-03-08 DIAGNOSIS — Z170 Estrogen receptor positive status [ER+]: Secondary | ICD-10-CM

## 2018-03-08 DIAGNOSIS — C50811 Malignant neoplasm of overlapping sites of right female breast: Secondary | ICD-10-CM | POA: Diagnosis not present

## 2018-03-08 NOTE — Patient Instructions (Addendum)
Patient is scheduled for surgery on 03/19/2018. The patient is aware to call back for any questions or concerns. Patient to use the cream on right nipple before surgery,

## 2018-03-08 NOTE — Progress Notes (Signed)
Patient ID: Theresa Chaney, female   DOB: 1981/10/04, 37 y.o.   MRN: [id number]  Chief Complaint  Patient presents with  . Pre-op Exam    HPI Adel Zeigler is a 37 y.o. female here today for her pre op right mastectomy scheduled 03/19/2018. Finished her last treatment on 01/03/2018. She is having right breast reconstruction  by Dr. Marla Roe.  HPI  Past Medical History:  Diagnosis Date  . BRCA negative 11/26/2017  . Breast cancer ([place])   . Depression   . Family history of cancer   . GERD (gastroesophageal reflux disease)   . Personal history of chemotherapy    current for right breast ca    Past Surgical History:  Procedure Laterality Date  . BREAST BIOPSY Right 10/11/2017   12:30 posterior coil clip invasive mammary carcinoma  . BREAST BIOPSY Right 10/11/2017   11:30 middle depth ribbon clip DCIS  . BREAST BIOPSY Right 10/11/2017   5:30 anterior depth x shape invasive ductal carcinoma  . PORTACATH PLACEMENT Left 10/24/2017   Procedure: INSERTION PORT-A-CATH;  Surgeon: Robert Bellow, MD;  Location: [facility] ORS;  Service: General;  Laterality: Left;    Family History  Problem Relation Age of Onset  . Melanoma Maternal Aunt        other aunts with BCC/SCC/Melanoma  . Diabetes Father   . Hypertension Father   . Hyperlipidemia Father   . Bladder Cancer Maternal Grandmother   . Cervical cancer Maternal Aunt 64       daughter w/ cervical cancer as well  . Melanoma Maternal Uncle        other uncles with BCC/SCC/Melanoma    Social History Social History   Tobacco Use  . Smoking status: Current Every Day Smoker    Packs/day: 1.00    Years: 5.00    Pack years: 5.00    Types: Cigarettes    Last attempt to quit: 12/12/2017    Years since quitting: 0.2  . Smokeless tobacco: Never Used  Substance Use Topics  . Alcohol use: No    Frequency: Never  . Drug use: No    No Known Allergies  Current Outpatient Medications  Medication Sig Dispense Refill  . albuterol  (PROVENTIL HFA;VENTOLIN HFA) 108 (90 Base) MCG/ACT inhaler Inhale 2 puffs into the lungs every 6 (six) hours as needed for wheezing or shortness of breath (Cough). 1 Inhaler 2  . ALPRAZolam (XANAX) 0.25 MG tablet Take 0.25 mg at bedtime as needed by mouth for anxiety or sleep.     . Calcium Carb-Cholecalciferol (CALCIUM 600 + D PO) Take 1 tablet daily by mouth.    . chlorhexidine (PERIDEX) 0.12 % solution USE AS DIRECTED 15MLS IN THE MOUTH OR THROAT TWICE A DAY (Patient taking differently: USE AS DIRECTED 15MLS IN THE MOUTH OR THROAT TWICE A DAY WHILE ON CHEMO) 473 mL 1  . docusate sodium (COLACE) 100 MG capsule Take 1 capsule (100 mg total) by mouth 2 (two) times daily. (Patient taking differently: Take 100 mg by mouth 2 (two) times daily. While on chemo) 60 capsule 0  . doxylamine, Sleep, (UNISOM) 25 MG tablet Take 25 mg by mouth at bedtime as needed for sleep.    [name] escitalopram (LEXAPRO) 20 MG tablet Take 20 mg daily by mouth.    . esomeprazole (NEXIUM) 40 MG capsule Take 40 mg daily by mouth.    [name] ibuprofen (ADVIL,MOTRIN) 800 MG tablet Take 800 mg by mouth daily as needed for moderate pain.    [name]  lidocaine (XYLOCAINE) 2 % solution Use as directed 10 mLs in the mouth or throat every 4 (four) hours as needed for mouth pain. 200 mL 0  . lidocaine-prilocaine (EMLA) cream Apply to affected area once (Patient taking differently: Apply 1 application topically daily as needed (port access). ) 30 g 3  . loratadine (CLARITIN) 10 MG tablet Take 10 mg by mouth daily.     . prochlorperazine (COMPAZINE) 10 MG tablet Take 1 tablet (10 mg total) by mouth every 6 (six) hours as needed (Nausea or vomiting). 30 tablet 1  . promethazine (PHENERGAN) 25 MG tablet Take 1 tablet (25 mg total) by mouth every 6 (six) hours as needed for nausea or vomiting. 30 tablet 0   No current facility-administered medications for this visit.     Review of Systems Review of Systems  Constitutional: Negative.   Respiratory:  Negative.   Cardiovascular: Negative.     Blood pressure 120/78, pulse 80, resp. rate 14, height _0  (1.676 m), weight 171 lb (77.6 kg).  Physical Exam Physical Exam  Constitutional: She is oriented to person, place, and time. She appears well-developed and well-nourished.  Eyes: Conjunctivae are normal. No scleral icterus.  Neck: Neck supple.  Cardiovascular: Normal rate, regular rhythm and normal heart sounds.  Pulmonary/Chest: Effort normal and breath sounds normal.    Right breast fullness right upper outer quadrant.   Lymphadenopathy:    She has no cervical adenopathy.  Neurological: She is alert and oriented to person, place, and time.  Skin: Skin is warm and dry.    Data Reviewed March 06, 2018 office visit with plastic surgery.  Assessment    Right breast cancer with minimal response to neoadjuvant chemotherapy, candidate for mastectomy with immediate reconstruction.    Plan  Discussed nipple sparing versus standard skin sparing mastectomy.  Patient is not interested in having her nipple preserved, planning to have an exotic tattoo applied to the area after expansion is complete.  Plans for overnight stay discussed.   Patient is scheduled for surgery on 03/19/2018. The patient is aware to call back for any questions or concerns. Patient to use the cream on right nipple before surgery,    HPI, Physical Exam, Assessment and Plan have been scribed under the direction and in the presence of Hervey Ard, MD.  Gaspar Cola, CMA  I have completed the exam and reviewed the above documentation for accuracy and completeness.  I agree with the above.  Haematologist has been used and any errors in dictation or transcription are unintentional.  Hervey Ard, M.D., F.A.C.S.  Forest Gleason Dela Sweeny 03/09/2018, 6:13 PM

## 2018-03-09 ENCOUNTER — Encounter: Payer: Self-pay | Admitting: General Surgery

## 2018-03-12 ENCOUNTER — Other Ambulatory Visit: Payer: Self-pay

## 2018-03-12 ENCOUNTER — Encounter
Admission: RE | Admit: 2018-03-12 | Discharge: 2018-03-12 | Disposition: A | Discharge status: Home / Self Care | Payer: BLUE CROSS/BLUE SHIELD | Source: Ambulatory Visit | Attending: General Surgery | Admitting: General Surgery

## 2018-03-12 HISTORY — DX: Unspecified chronic bronchitis: J42

## 2018-03-12 HISTORY — DX: Cardiac murmur, unspecified: R01.1

## 2018-03-12 HISTORY — PX: MASTECTOMY: SHX3

## 2018-03-12 HISTORY — DX: Chronic obstructive pulmonary disease, unspecified: J44.9

## 2018-03-12 HISTORY — DX: Anemia, unspecified: D64.9

## 2018-03-12 HISTORY — DX: Headache: R51

## 2018-03-12 HISTORY — DX: Headache, unspecified: R51.9

## 2018-03-12 NOTE — Patient Instructions (Addendum)
Your procedure is scheduled on: 03-19-18 MONDAY Report to [place] @ 8:15 AM (2ND DESK ON THE RIGHT)  Remember: Instructions that are not followed completely may result in serious medical risk, up to and including death, or upon the discretion of your surgeon and anesthesiologist your surgery may need to be rescheduled.    _x___ 1. Do not eat food after midnight the night before your procedure. NO GUM OR CANDY AFTER MIDNIGHT. You may drink clear liquids up to 2 hours before you are scheduled to arrive at the hospital for your procedure.  Do not drink clear liquids within 2 hours of your scheduled arrival to the hospital.  Clear liquids include  --Water or Apple juice without pulp  --Clear carbohydrate beverage such as ClearFast or Gatorade  --Black Coffee or Clear Tea (No milk, no creamers, do not add anything to the coffee or Tea    __x__ 2. No Alcohol for 24 hours before or after surgery.   __x__3. No Smoking or e-cigarettes for 24 prior to surgery.  Do not use any chewable tobacco products for at least 6 hour prior to surgery   ____  4. Bring all medications with you on the day of surgery if instructed.    __x__ 5. Notify your doctor if there is any change in your medical condition     (cold, fever, infections).    x___6. On the morning of surgery brush your teeth with toothpaste and water.  You may rinse your mouth with mouth wash if you wish.  Do not swallow any toothpaste or mouthwash.   Do not wear jewelry, make-up, hairpins, clips or nail polish.  Do not wear lotions, powders, or perfumes. You may wear deodorant.  Do not shave 48 hours prior to surgery. Men may shave face and neck.  Do not bring valuables to the hospital.    [facility] is not responsible for any belongings or valuables.               Contacts, dentures or bridgework may not be worn into surgery.  Leave your suitcase in the car. After surgery it may be brought to your room.  For patients  admitted to the hospital, discharge time is determined by your  treatment team.  _  Patients discharged the day of surgery will not be allowed to drive home.  You will need someone to drive you home and stay with you the night of your procedure.    Please read over the following fact sheets that you were given:   [facility] Preparing for Surgery and or MRSA Information   _x___ TAKE THE FOLLOWING MEDICATION THE MORNING OF SURGERY WITH A SMALL SIP OF WATER. These include:  1. LEXAPRO  2. NEXIUM   3. TAKE AN EXTRA NEXIUM THE NIGHT BEFORE SURGERY  4. YOU MAY TAKE YOUR XANAX AM OF SURGERY IF NEEDED  5.  6.  ____Fleets enema or Magnesium Citrate as directed.   _x___ Use CHG Soap or sage wipes as directed on instruction sheet   _X___ Use inhalers on the day of surgery and bring to hospital day of surgery-USE YOUR ALBUTEROL INHALER AT [place]  ____ Stop Metformin and Janumet 2 days prior to surgery.    ____ Take 1/2 of usual insulin dose the night before surgery and none on the morning surgery.   ____ Follow recommendations from Cardiologist, Pulmonologist or PCP regarding stopping Aspirin, Coumadin, Plavix ,Eliquis, Effient,  or Pradaxa, and Pletal.  X____Stop Anti-inflammatories such as Advil, Aleve, Ibuprofen, Motrin, Naproxen, Naprosyn, Goodies powders or aspirin products NOW-OK to take Tylenol   ____ Stop supplements until after surgery.     ____ Bring C-Pap to the hospital.

## 2018-03-12 NOTE — Pre-Procedure Instructions (Signed)
[name]  ECHO COMPLETE WO IMAGING ENHANCING AGENT  Order# [id number]  Reading physician: Minna Merritts, MD Ordering physician: Earlie Server, MD Study date: 10/26/17  Study Result   Result status: Final result                   *[place], [place] [id number]                            [phone]  ------------------------------------------------------------------- Transthoracic Echocardiography  Patient:    Theresa Chaney, Theresa Chaney MR #:       [id number] Study Date: 10/26/2017 Gender:     F Age:        37 Height:     170.2 cm Weight:     71.2 kg BSA:        1.84 m^2 Pt. Status: Room:   SONOGRAPHER  [facility]  PERFORMING   [facility]  ATTENDING    Earlie Server  Ventura Bruns, Zhou  Norton Pastel  cc:  ------------------------------------------------------------------- LV EF: 60% -   65%  ------------------------------------------------------------------- Indications:      Malignant neoplasm of overlapping sites of right breast in female, estrgen receptor positive.  ------------------------------------------------------------------- Study Conclusions  - Left ventricle: The cavity size was normal. Systolic function was   normal. The estimated ejection fraction was in the range of 60%   to 65%. Wall motion was normal; there were no regional wall   motion abnormalities. Doppler parameters are consistent with   abnormal left ventricular relaxation (grade 1 diastolic   dysfunction). - Left atrium: The atrium was normal in size. - Right ventricle: Systolic function was normal. - Pulmonary arteries: Systolic pressure was within the normal   range.  ------------------------------------------------------------------- Study data:   Study status:  Routine.  Procedure:  The patient reported no pain pre or post test. Transthoracic echocardiography. Image quality was  fair.          Transthoracic echocardiography. M-mode, complete 2D, spectral Doppler, and color Doppler. Birthdate:  Patient birthdate: Oct 05, 1981.  Age:  Patient is 37 yr old.  Sex:  Gender: female.    BMI: 24.6 kg/m^2.  Patient status: Outpatient.  Study date:  Study date: 10/26/2017. Study time: 09:50 AM.  Location:  Echo laboratory.  -------------------------------------------------------------------  ------------------------------------------------------------------- Left ventricle:  The cavity size was normal. Systolic function was normal. The estimated ejection fraction was in the range of 60% to 65%. Wall motion was normal; there were no regional wall motion abnormalities. Doppler parameters are consistent with abnormal left ventricular relaxation (grade 1 diastolic dysfunction).  ------------------------------------------------------------------- Aortic valve:  Poorly visualized.  Trileaflet; normal thickness leaflets. Mobility was not restricted.  Doppler:  Transvalvular velocity was within the normal range. There was no stenosis. There was no regurgitation.    Peak velocity ratio of LVOT to aortic valve: 0.79. Valve area (Vmax): 2.47 cm^2. Indexed valve area (Vmax): 1.34 cm^2/m^2.    Peak gradient (S): 6 mm Hg.  ------------------------------------------------------------------- Aorta:  Aortic root: The aortic root was normal in size.  ------------------------------------------------------------------- Mitral valve:   Structurally normal valve.   Mobility was not restricted.  Doppler:  Transvalvular velocity was within the normal range. There was no evidence for stenosis. There was no regurgitation.    Valve area by pressure half-time: 3.79 cm^2. Indexed valve area by pressure half-time: 2.06 cm^2/m^2.  ------------------------------------------------------------------- Left atrium:  The atrium was normal in  size.  ------------------------------------------------------------------- Right ventricle:  The cavity size was normal. Wall thickness was normal. Systolic function was normal.  ------------------------------------------------------------------- Pulmonic valve:    Doppler:  Transvalvular velocity was within the normal range. There was no evidence for stenosis.  ------------------------------------------------------------------- Tricuspid valve:   Structurally normal valve.    Doppler: Transvalvular velocity was within the normal range. There was no regurgitation.  ------------------------------------------------------------------- Pulmonary artery:   The main pulmonary artery was normal-sized. Systolic pressure was within the normal range.  ------------------------------------------------------------------- Right atrium:  The atrium was normal in size.  ------------------------------------------------------------------- Pericardium:  There was no pericardial effusion.  ------------------------------------------------------------------- Systemic veins: Inferior vena cava: The vessel was normal in size. The respirophasic diameter changes were in the normal range (>= 50%), consistent with normal central venous pressure.  ------------------------------------------------------------------- Measurements   Left ventricle                           Value          Reference  LV ID, ED, PLAX chordal          (L)     37.8  mm       43 - 52  LV ID, ES, PLAX chordal                  25.2  mm       23 - 38  LV fx shortening, PLAX chordal           33    %        >=29  LV PW thickness, ED                      9.19  mm       ----------  IVS/LV PW ratio, ED                      1.01           <=1.3  LV e&', lateral                           11.5  cm/s     ----------  LV E/e&', lateral                         5.31           ----------  LV e&', medial                            10.4   cm/s     ----------  LV E/e&', medial                          5.88           ----------  LV e&', average                           10.95 cm/s     ----------  LV E/e&', average  5.58           ----------    Ventricular septum                       Value          Reference  IVS thickness, ED                        9.26  mm       ----------    LVOT                                     Value          Reference  LVOT ID, S                               20    mm       ----------  LVOT area                                3.14  cm^2     ----------  LVOT peak velocity, S                    96    cm/s     ----------    Aortic valve                             Value          Reference  Aortic valve peak velocity, S            122   cm/s     ----------  Aortic peak gradient, S                  6     mm Hg    ----------  Velocity ratio, peak, LVOT/AV            0.79           ----------  Aortic valve area, peak velocity         2.47  cm^2     ----------  Aortic valve area/bsa, peak              1.34  cm^2/m^2 ----------  velocity    Aorta                                    Value          Reference  Aortic root ID, ED                       24    mm       ----------    Left atrium                              Value          Reference  LA ID, A-P, ES  23    mm       ----------  LA ID/bsa, A-P                           1.25  cm/m^2   <=2.2  LA volume, S                             42.3  ml       ----------  LA volume/bsa, S                         22.9  ml/m^2   ----------  LA volume, ES, 1-p A4C                   33.9  ml       ----------  LA volume/bsa, ES, 1-p A4C               18.4  ml/m^2   ----------  LA volume, ES, 1-p A2C                   51.3  ml       ----------  LA volume/bsa, ES, 1-p A2C               27.8  ml/m^2   ----------    Mitral valve                             Value          Reference  Mitral E-wave peak velocity               61.1  cm/s     ----------  Mitral A-wave peak velocity              77.1  cm/s     ----------  Mitral deceleration time                 197   ms       150 - 230  Mitral pressure half-time                58    ms       ----------  Mitral E/A ratio, peak                   0.8            ----------  Mitral valve area, PHT, DP               3.79  cm^2     ----------  Mitral valve area/bsa, PHT, DP           2.06  cm^2/m^2 ----------    Right atrium                             Value          Reference  RA ID, S-I, ES, A4C                      41.6  mm       34 - 49  RA area, ES, A4C  14    cm^2     8.3 - 19.5  RA volume, ES, A/L                       37.5  ml       ----------  RA volume/bsa, ES, A/L                   20.3  ml/m^2   ----------    Right ventricle                          Value          Reference  RV ID, ED, PLAX                          26.8  mm       19 - 38  RV s&', lateral, S                        11.6  cm/s     ----------    Pulmonic valve                           Value          Reference  Pulmonic valve peak velocity, S          84.9  cm/s     ----------  Legend: (L)  and  (H)  mark values outside specified reference range.  ------------------------------------------------------------------- Prepared and Electronically Authenticated by  Esmond Plants, MD, [facility] 2018-11-15T12:56:36  MERGE Images   Show images for ECHOCARDIOGRAM COMPLETE  Patient Information   Patient Name Alexandre, Lightsey Sex Female DOB 03-28-1981 SSN [id number]  Reason for Exam  Priority: Routine  Dx: Malignant neoplasm of overlapping sites of right breast in female, estrogen receptor positive ([place]) [C50.811, Z17.0 (ICD-10-CM)]  Comments: Before chemotherapy  Surgical History   Surgical History   No past medical history on file.    Other Surgical History   Procedure Laterality Date Comment Source  BREAST BIOPSY Right 10/11/2017 12:30 posterior coil clip invasive  mammary carcinoma Provider  BREAST BIOPSY Right 10/11/2017 11:30 middle depth ribbon clip DCIS Provider  BREAST BIOPSY Right 10/11/2017 5:30 anterior depth x shape invasive ductal carcinoma Provider  PORTACATH PLACEMENT Left 10/24/2017 Procedure: INSERTION PORT-A-CATH; Surgeon: Robert Bellow, MD; Location: [facility] ORS; Service: General; Laterality: Left; Provider    Performing Technologist/Nurse   Performing Technologist/Nurse: Gabriel Cirri                    Implants    No active implants to display in this view.  Order-Level Documents:   There are no order-level documents.  Encounter-Level Documents - 10/26/2017:   Electronic signature on 10/26/2017 9:33 AM - Signed      Signed   Electronically signed by Minna Merritts, MD on 10/26/17 at 1256 EST  Printable Result Report   Result Report   External Result Report   External Result Report

## 2018-03-13 NOTE — H&P (Signed)
Theresa Chaney is a 37 y.o. female here for Pre Op Consulting (sign consents (surgery with byrnette)) .  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 Bilateral mastectomy planned with Dr. Tollie Pizza in 2 weeks.   Her cancer genetic testing was pan-negative.  Past Medical History:  has a past medical history of Anxiety, unspecified, Cancer (CMS-HCC) (10/2017), Depression, unspecified, GERD (gastroesophageal reflux disease), Headache, and Heart murmur, unspecified.  Past Surgical History:  has no past surgical history on file. Family History: family history includes Alcohol abuse in her brother; Arthritis in her father; Cancer in her maternal grandmother; Colon polyps in her father; Coronary Artery Disease (Blocked arteries around heart) in her father; Diabetes type II in her father; Glaucoma in her father; High blood pressure (Hypertension) in her father; Myasthenia gravis in her sister. Social History:  reports that she has quit smoking. She has a 2.50 pack-year smoking history. She has never used smokeless tobacco. She reports that she does not drink alcohol or use drugs. OB/GYN History:          OB History    Gravida  1   Para  1   Term  1   Preterm      AB      Living  1     SAB      TAB      Ectopic      Molar      Multiple      Live Births             Allergies: has No Known Allergies. Medications:  Current Outpatient Medications:  .  albuterol 90 mcg/actuation inhaler, Inhale into the lungs, Disp: , Rfl:  .  calcium carb/vit D3/minerals (CALCIUM-VITAMIN D ORAL), Take by mouth, Disp: , Rfl:  .  cephalexin (KEFLEX) 500 MG capsule, , Disp: , Rfl: 0 .  diazePAM (VALIUM) 2 MG tablet, , Disp: , Rfl: 0 .  doxylamine succinate (UNISOM) 25 mg tablet, Take by mouth, Disp: , Rfl:  .  escitalopram oxalate (LEXAPRO) 20 MG tablet, Take 1 tablet (20 mg total) by mouth once daily., Disp: 30 tablet,  Rfl: 11 .  esomeprazole (NEXIUM) 40 MG DR capsule, Take 1 capsule (40 mg total) by mouth once daily., Disp: 90 capsule, Rfl: 1 .  ibuprofen (ADVIL,MOTRIN) 800 MG tablet, as needed., Disp: , Rfl: 1 .  lidocaine-prilocaine (EMLA) cream, , Disp: , Rfl: 2 .  prochlorperazine (COMPAZINE) 10 MG tablet, Take by mouth, Disp: , Rfl:  .  promethazine (PHENERGAN) 25 MG tablet, Take by mouth, Disp: , Rfl:   Review of Systems: No SOB, no palpitations or chest pain, no new lower extremity edema, no nausea or vomiting or bowel or bladder complaints. See HPI for gyn specific ROS.    Exam:      Vitals:   03/13/18 1029  BP: 115/84  Pulse: (!) 117   Body mass index is 26.97 kg/m.  General: Well-developed, well-nourished female in no acute distress Body mass index is 26.97 kg/m. Skin: No rashes, ulcers or skin lesions noted. No excessive hirsutism or acne noted.  CV: RRR Pulm: CTAB  Neurological: Appears alert and oriented and is a good historian. No gross abnormalities are noted. Psychological: Normal affect and mood. No signs of anxiety or depression noted.  Pelvic exam: Deferred    Impression:   The encounter diagnosis was Preop examination.    Plan:   - Discussion of  risks and benefits with early BSO discussed with patient. She strongly requests BSO, and our discussion touched on risks of early menopause, including increased risk of heart disease, osteoporosis, sexual dysfunction, severe menopausal sx, possible dementia and colon cancer increase. Her worry for recurrent breast cancer is high, and she feels these risks may not outweigh the benefit of worrying less about cancer recurrence.   After discussion with Dr. Tasia Catchings, the patient confirms she is still interested in BSO, and we will perform in conjunction with gen surg at her mastectomy. Remove ovaries from right port site. -  Return if symptoms worsen or fail to improve.  Sherrie George,  MD

## 2018-03-15 ENCOUNTER — Encounter
Admission: RE | Admit: 2018-03-15 | Discharge: 2018-03-15 | Disposition: A | Discharge status: Home / Self Care | Payer: BLUE CROSS/BLUE SHIELD | Source: Ambulatory Visit | Attending: General Surgery | Admitting: General Surgery

## 2018-03-15 DIAGNOSIS — Z8371 Family history of colonic polyps: Secondary | ICD-10-CM | POA: Diagnosis not present

## 2018-03-15 DIAGNOSIS — Z8249 Family history of ischemic heart disease and other diseases of the circulatory system: Secondary | ICD-10-CM | POA: Diagnosis not present

## 2018-03-15 DIAGNOSIS — Z820 Family history of epilepsy and other diseases of the nervous system: Secondary | ICD-10-CM | POA: Diagnosis not present

## 2018-03-15 DIAGNOSIS — Z170 Estrogen receptor positive status [ER+]: Secondary | ICD-10-CM | POA: Diagnosis not present

## 2018-03-15 DIAGNOSIS — Z811 Family history of alcohol abuse and dependence: Secondary | ICD-10-CM | POA: Insufficient documentation

## 2018-03-15 DIAGNOSIS — Z0183 Encounter for blood typing: Secondary | ICD-10-CM | POA: Diagnosis not present

## 2018-03-15 DIAGNOSIS — Z833 Family history of diabetes mellitus: Secondary | ICD-10-CM | POA: Diagnosis not present

## 2018-03-15 DIAGNOSIS — Z87891 Personal history of nicotine dependence: Secondary | ICD-10-CM | POA: Insufficient documentation

## 2018-03-15 DIAGNOSIS — F419 Anxiety disorder, unspecified: Secondary | ICD-10-CM | POA: Insufficient documentation

## 2018-03-15 DIAGNOSIS — F329 Major depressive disorder, single episode, unspecified: Secondary | ICD-10-CM | POA: Diagnosis not present

## 2018-03-15 DIAGNOSIS — Z8261 Family history of arthritis: Secondary | ICD-10-CM | POA: Insufficient documentation

## 2018-03-15 DIAGNOSIS — K219 Gastro-esophageal reflux disease without esophagitis: Secondary | ICD-10-CM | POA: Diagnosis not present

## 2018-03-15 DIAGNOSIS — Z01812 Encounter for preprocedural laboratory examination: Secondary | ICD-10-CM | POA: Diagnosis present

## 2018-03-15 DIAGNOSIS — Z79899 Other long term (current) drug therapy: Secondary | ICD-10-CM | POA: Diagnosis not present

## 2018-03-15 DIAGNOSIS — D0510 Intraductal carcinoma in situ of unspecified breast: Secondary | ICD-10-CM | POA: Diagnosis not present

## 2018-03-15 LAB — TYPE AND SCREEN
ABO/RH(D): A POS
ANTIBODY SCREEN: NEGATIVE

## 2018-03-15 LAB — BASIC METABOLIC PANEL
ANION GAP: 10 (ref 5–15)
BUN: 12 mg/dL (ref 6–20)
CO2: 25 mmol/L (ref 22–32)
Calcium: 9.5 mg/dL (ref 8.9–10.3)
Chloride: 105 mmol/L (ref 101–111)
Creatinine, Ser: 0.71 mg/dL (ref 0.44–1.00)
GFR calc Af Amer: >60 mL/min (ref >60)
GLUCOSE: 95 mg/dL (ref 65–99)
POTASSIUM: 3.2 mmol/L — AB (ref 3.5–5.1)
Sodium: 140 mmol/L (ref 135–145)

## 2018-03-15 LAB — CBC
HEMATOCRIT: 40.3 % (ref 35.0–47.0)
HEMOGLOBIN: 13.4 g/dL (ref 12.0–16.0)
MCH: 30.4 pg (ref 26.0–34.0)
MCHC: 33.2 g/dL (ref 32.0–36.0)
MCV: 91.4 fL (ref 80.0–100.0)
Platelets: 307 10*3/uL (ref 150–440)
RBC: 4.41 MIL/uL (ref 3.80–5.20)
RDW: 13.4 % (ref 11.5–14.5)
WBC: 8.2 10*3/uL (ref 3.6–11.0)

## 2018-03-15 NOTE — Pre-Procedure Instructions (Signed)
K+ 3.2-DR BEASLEY ORDERED THE MET B SO I FAXED LOW K+ TO HER OFFICE [facility] FAX CONFIRMATION RECEIVED

## 2018-03-16 ENCOUNTER — Ambulatory Visit: Payer: Self-pay | Admitting: Plastic Surgery

## 2018-03-16 DIAGNOSIS — C50911 Malignant neoplasm of unspecified site of right female breast: Secondary | ICD-10-CM

## 2018-03-18 MED ORDER — CEFAZOLIN SODIUM-DEXTROSE 2-4 GM/100ML-% IV SOLN
2.0000 g | INTRAVENOUS | Status: AC
  Administered 2018-03-19: 2 g via INTRAVENOUS
  Filled 2018-03-18: qty 100

## 2018-03-19 ENCOUNTER — Ambulatory Visit
Admission: RE | Admit: 2018-03-19 | Discharge: 2018-03-19 | Disposition: A | Discharge status: Home / Self Care | Payer: BLUE CROSS/BLUE SHIELD | Source: Ambulatory Visit | Attending: General Surgery | Admitting: General Surgery

## 2018-03-19 ENCOUNTER — Other Ambulatory Visit: Payer: Self-pay

## 2018-03-19 ENCOUNTER — Ambulatory Visit: Payer: BLUE CROSS/BLUE SHIELD | Admitting: Anesthesiology

## 2018-03-19 ENCOUNTER — Encounter: Payer: Self-pay | Admitting: *Deleted

## 2018-03-19 ENCOUNTER — Encounter
Admission: RE | Disposition: A | Discharge status: Home / Self Care | Payer: Self-pay | Source: Ambulatory Visit | Attending: General Surgery

## 2018-03-19 ENCOUNTER — Observation Stay
Admission: RE | Admit: 2018-03-19 | Discharge: 2018-03-20 | Disposition: A | Discharge status: Home / Self Care | Payer: BLUE CROSS/BLUE SHIELD | Source: Ambulatory Visit | Attending: General Surgery | Admitting: General Surgery

## 2018-03-19 DIAGNOSIS — Z302 Encounter for sterilization: Secondary | ICD-10-CM | POA: Diagnosis not present

## 2018-03-19 DIAGNOSIS — F329 Major depressive disorder, single episode, unspecified: Secondary | ICD-10-CM | POA: Diagnosis not present

## 2018-03-19 DIAGNOSIS — J45909 Unspecified asthma, uncomplicated: Secondary | ICD-10-CM | POA: Diagnosis not present

## 2018-03-19 DIAGNOSIS — J449 Chronic obstructive pulmonary disease, unspecified: Secondary | ICD-10-CM | POA: Insufficient documentation

## 2018-03-19 DIAGNOSIS — Z170 Estrogen receptor positive status [ER+]: Secondary | ICD-10-CM | POA: Diagnosis not present

## 2018-03-19 DIAGNOSIS — D649 Anemia, unspecified: Secondary | ICD-10-CM | POA: Insufficient documentation

## 2018-03-19 DIAGNOSIS — Z808 Family history of malignant neoplasm of other organs or systems: Secondary | ICD-10-CM | POA: Insufficient documentation

## 2018-03-19 DIAGNOSIS — Z87891 Personal history of nicotine dependence: Secondary | ICD-10-CM | POA: Insufficient documentation

## 2018-03-19 DIAGNOSIS — C50411 Malignant neoplasm of upper-outer quadrant of right female breast: Principal | ICD-10-CM | POA: Diagnosis present

## 2018-03-19 DIAGNOSIS — C50911 Malignant neoplasm of unspecified site of right female breast: Secondary | ICD-10-CM

## 2018-03-19 DIAGNOSIS — K219 Gastro-esophageal reflux disease without esophagitis: Secondary | ICD-10-CM | POA: Diagnosis not present

## 2018-03-19 DIAGNOSIS — R51 Headache: Secondary | ICD-10-CM | POA: Diagnosis not present

## 2018-03-19 DIAGNOSIS — Z9221 Personal history of antineoplastic chemotherapy: Secondary | ICD-10-CM | POA: Diagnosis not present

## 2018-03-19 DIAGNOSIS — Z7951 Long term (current) use of inhaled steroids: Secondary | ICD-10-CM | POA: Insufficient documentation

## 2018-03-19 DIAGNOSIS — Z809 Family history of malignant neoplasm, unspecified: Secondary | ICD-10-CM | POA: Insufficient documentation

## 2018-03-19 DIAGNOSIS — Z8249 Family history of ischemic heart disease and other diseases of the circulatory system: Secondary | ICD-10-CM | POA: Diagnosis not present

## 2018-03-19 DIAGNOSIS — C773 Secondary and unspecified malignant neoplasm of axilla and upper limb lymph nodes: Secondary | ICD-10-CM | POA: Insufficient documentation

## 2018-03-19 DIAGNOSIS — C50111 Malignant neoplasm of central portion of right female breast: Secondary | ICD-10-CM | POA: Diagnosis not present

## 2018-03-19 DIAGNOSIS — C50811 Malignant neoplasm of overlapping sites of right female breast: Secondary | ICD-10-CM

## 2018-03-19 DIAGNOSIS — Z833 Family history of diabetes mellitus: Secondary | ICD-10-CM | POA: Insufficient documentation

## 2018-03-19 HISTORY — PX: BREAST RECONSTRUCTION WITH PLACEMENT OF TISSUE EXPANDER AND FLEX HD (ACELLULAR HYDRATED DERMIS): SHX6295

## 2018-03-19 HISTORY — PX: AXILLARY LYMPH NODE DISSECTION: SHX5229

## 2018-03-19 HISTORY — PX: LAPAROSCOPIC BILATERAL SALPINGO OOPHERECTOMY: SHX5890

## 2018-03-19 HISTORY — PX: MASTECTOMY W/ SENTINEL NODE BIOPSY: SHX2001

## 2018-03-19 LAB — ABO/RH: ABO/RH(D): A POS

## 2018-03-19 LAB — POCT I-STAT 4, (NA,K, GLUC, HGB,HCT)
Glucose, Bld: 97 mg/dL (ref 65–99)
HEMATOCRIT: 38 % (ref 36.0–46.0)
Hemoglobin: 12.9 g/dL (ref 12.0–15.0)
POTASSIUM: 3.5 mmol/L (ref 3.5–5.1)
SODIUM: 142 mmol/L (ref 135–145)

## 2018-03-19 LAB — POCT PREGNANCY, URINE: PREG TEST UR: NEGATIVE

## 2018-03-19 SURGERY — MASTECTOMY WITH SENTINEL LYMPH NODE BIOPSY
Anesthesia: General | Laterality: Right | Wound class: "Clean "

## 2018-03-19 MED ORDER — FENTANYL CITRATE (PF) 250 MCG/5ML IJ SOLN
INTRAMUSCULAR | Status: AC
  Filled 2018-03-19: qty 5

## 2018-03-19 MED ORDER — DEXAMETHASONE SODIUM PHOSPHATE 10 MG/ML IJ SOLN
INTRAMUSCULAR | Status: DC | PRN
  Administered 2018-03-19: 10 mg via INTRAVENOUS

## 2018-03-19 MED ORDER — CEFAZOLIN SODIUM 1 G IJ SOLR
INTRAMUSCULAR | Status: AC
Start: 2018-03-19 — End: ?
  Filled 2018-03-19: qty 10

## 2018-03-19 MED ORDER — GABAPENTIN 800 MG PO TABS: 800.0000 mg | ORAL_TABLET | Freq: Every day | ORAL | 0 refills | Status: DC

## 2018-03-19 MED ORDER — PANTOPRAZOLE SODIUM 40 MG PO TBEC
40.0000 mg | DELAYED_RELEASE_TABLET | Freq: Every day | ORAL | Status: DC
  Filled 2018-03-19: qty 1

## 2018-03-19 MED ORDER — DOCUSATE SODIUM 100 MG PO CAPS
100.0000 mg | ORAL_CAPSULE | Freq: Two times a day (BID) | ORAL | Status: DC
  Administered 2018-03-19: 100 mg via ORAL
  Filled 2018-03-19 (×2): qty 1

## 2018-03-19 MED ORDER — DOCUSATE SODIUM 100 MG PO CAPS: 100.0000 mg | ORAL_CAPSULE | Freq: Two times a day (BID) | ORAL | 0 refills | Status: DC

## 2018-03-19 MED ORDER — OXYCODONE HCL 5 MG PO CAPS: 5.0000 mg | ORAL_CAPSULE | Freq: Four times a day (QID) | ORAL | 0 refills | Status: DC | PRN

## 2018-03-19 MED ORDER — PROPOFOL 10 MG/ML IV BOLUS
INTRAVENOUS | Status: DC | PRN
  Administered 2018-03-19: 150 mg via INTRAVENOUS

## 2018-03-19 MED ORDER — MORPHINE SULFATE (PF) 2 MG/ML IV SOLN
2.0000 mg | INTRAVENOUS | Status: DC | PRN
  Administered 2018-03-20: 2 mg via INTRAVENOUS
  Filled 2018-03-19: qty 1

## 2018-03-19 MED ORDER — ESCITALOPRAM OXALATE 20 MG PO TABS
20.0000 mg | ORAL_TABLET | Freq: Every morning | ORAL | Status: DC
  Administered 2018-03-20: 20 mg via ORAL
  Filled 2018-03-19: qty 1

## 2018-03-19 MED ORDER — CHLORHEXIDINE GLUCONATE CLOTH 2 % EX PADS: 6.0000 | MEDICATED_PAD | Freq: Once | CUTANEOUS | Status: DC

## 2018-03-19 MED ORDER — PROMETHAZINE HCL 25 MG/ML IJ SOLN: 6.2500 mg | INTRAMUSCULAR | Status: DC | PRN

## 2018-03-19 MED ORDER — OXYCODONE HCL 5 MG PO TABS
5.0000 mg | ORAL_TABLET | ORAL | Status: DC | PRN
  Administered 2018-03-19 – 2018-03-20 (×2): 5 mg via ORAL
  Filled 2018-03-19 (×2): qty 1

## 2018-03-19 MED ORDER — KETOROLAC TROMETHAMINE 30 MG/ML IJ SOLN
INTRAMUSCULAR | Status: AC
  Filled 2018-03-19: qty 1

## 2018-03-19 MED ORDER — KETOROLAC TROMETHAMINE 30 MG/ML IJ SOLN
INTRAMUSCULAR | Status: DC | PRN
  Administered 2018-03-19: 30 mg via INTRAVENOUS

## 2018-03-19 MED ORDER — ONDANSETRON HCL 4 MG/2ML IJ SOLN
INTRAMUSCULAR | Status: AC
  Filled 2018-03-19: qty 2

## 2018-03-19 MED ORDER — PROMETHAZINE HCL 25 MG/ML IJ SOLN: 12.5000 mg | INTRAMUSCULAR | Status: DC | PRN

## 2018-03-19 MED ORDER — FENTANYL CITRATE (PF) 100 MCG/2ML IJ SOLN
INTRAMUSCULAR | Status: AC
  Filled 2018-03-19: qty 2

## 2018-03-19 MED ORDER — FENTANYL CITRATE (PF) 100 MCG/2ML IJ SOLN: 25.0000 ug | INTRAMUSCULAR | Status: DC | PRN

## 2018-03-19 MED ORDER — LACTATED RINGERS IV SOLN
INTRAVENOUS | Status: DC
  Administered 2018-03-19: 16:00:00 via INTRAVENOUS

## 2018-03-19 MED ORDER — METHYLENE BLUE 0.5 % INJ SOLN
INTRAVENOUS | Status: DC | PRN
  Administered 2018-03-19: 5 mL via SUBMUCOSAL

## 2018-03-19 MED ORDER — ROCURONIUM BROMIDE 50 MG/5ML IV SOLN
INTRAVENOUS | Status: AC
  Filled 2018-03-19: qty 1

## 2018-03-19 MED ORDER — ACETAMINOPHEN 500 MG PO TABS: 1000.0000 mg | ORAL_TABLET | Freq: Four times a day (QID) | ORAL | 0 refills | Status: AC

## 2018-03-19 MED ORDER — ACETAMINOPHEN 325 MG PO TABS
650.0000 mg | ORAL_TABLET | ORAL | Status: DC
  Administered 2018-03-19 – 2018-03-20 (×4): 650 mg via ORAL
  Filled 2018-03-19 (×4): qty 2

## 2018-03-19 MED ORDER — MEPERIDINE HCL 50 MG/ML IJ SOLN: 6.2500 mg | INTRAMUSCULAR | Status: DC | PRN

## 2018-03-19 MED ORDER — TECHNETIUM TC 99M SULFUR COLLOID FILTERED
1.0000 | Freq: Once | INTRAVENOUS | Status: AC | PRN
  Administered 2018-03-19: 0.818 via INTRADERMAL

## 2018-03-19 MED ORDER — ACETAMINOPHEN 10 MG/ML IV SOLN
INTRAVENOUS | Status: DC | PRN
  Administered 2018-03-19: 1000 mg via INTRAVENOUS

## 2018-03-19 MED ORDER — ACETAMINOPHEN 10 MG/ML IV SOLN
INTRAVENOUS | Status: AC
  Filled 2018-03-19: qty 100

## 2018-03-19 MED ORDER — ALBUTEROL SULFATE (2.5 MG/3ML) 0.083% IN NEBU: 2.5000 mg | INHALATION_SOLUTION | Freq: Four times a day (QID) | RESPIRATORY_TRACT | Status: DC | PRN

## 2018-03-19 MED ORDER — MIDAZOLAM HCL 2 MG/2ML IJ SOLN
INTRAMUSCULAR | Status: AC
  Filled 2018-03-19: qty 2

## 2018-03-19 MED ORDER — BUPIVACAINE-EPINEPHRINE (PF) 0.25% -1:200000 IJ SOLN
INTRAMUSCULAR | Status: AC
  Filled 2018-03-19: qty 30

## 2018-03-19 MED ORDER — LORATADINE 10 MG PO TABS
10.0000 mg | ORAL_TABLET | Freq: Every day | ORAL | Status: DC
  Filled 2018-03-19: qty 1

## 2018-03-19 MED ORDER — CEFAZOLIN SODIUM-DEXTROSE 1-4 GM/50ML-% IV SOLN
INTRAVENOUS | Status: DC | PRN
  Administered 2018-03-19: 1 g via INTRAVENOUS

## 2018-03-19 MED ORDER — ONDANSETRON HCL 4 MG/2ML IJ SOLN
INTRAMUSCULAR | Status: DC | PRN
  Administered 2018-03-19: 4 mg via INTRAVENOUS

## 2018-03-19 MED ORDER — SUGAMMADEX SODIUM 500 MG/5ML IV SOLN
INTRAVENOUS | Status: DC | PRN
  Administered 2018-03-19: 300 mg via INTRAVENOUS

## 2018-03-19 MED ORDER — FENTANYL CITRATE (PF) 100 MCG/2ML IJ SOLN
INTRAMUSCULAR | Status: DC | PRN
  Administered 2018-03-19 (×4): 50 ug via INTRAVENOUS
  Administered 2018-03-19: 100 ug via INTRAVENOUS
  Administered 2018-03-19: 50 ug via INTRAVENOUS

## 2018-03-19 MED ORDER — METHYLENE BLUE 1 % INJ SOLN
INTRAMUSCULAR | Status: AC
  Filled 2018-03-19: qty 10

## 2018-03-19 MED ORDER — NEOMYCIN-POLYMYXIN B GU 40-200000 IR SOLN
Status: AC
  Filled 2018-03-19: qty 4

## 2018-03-19 MED ORDER — OXYCODONE HCL 5 MG/5ML PO SOLN: 5.0000 mg | Freq: Once | ORAL | Status: AC | PRN

## 2018-03-19 MED ORDER — LACTATED RINGERS IV SOLN
INTRAVENOUS | Status: DC
  Administered 2018-03-19 (×2): via INTRAVENOUS

## 2018-03-19 MED ORDER — DEXAMETHASONE SODIUM PHOSPHATE 10 MG/ML IJ SOLN
INTRAMUSCULAR | Status: AC
  Filled 2018-03-19: qty 1

## 2018-03-19 MED ORDER — DIAZEPAM 5 MG PO TABS
5.0000 mg | ORAL_TABLET | Freq: Four times a day (QID) | ORAL | Status: DC | PRN
  Administered 2018-03-20: 5 mg via ORAL
  Filled 2018-03-19: qty 1

## 2018-03-19 MED ORDER — CEFAZOLIN SODIUM-DEXTROSE 2-4 GM/100ML-% IV SOLN
2.0000 g | Freq: Three times a day (TID) | INTRAVENOUS | Status: DC
  Administered 2018-03-19 – 2018-03-20 (×3): 2 g via INTRAVENOUS
  Filled 2018-03-19 (×5): qty 100

## 2018-03-19 MED ORDER — OXYCODONE HCL 5 MG PO TABS
ORAL_TABLET | ORAL | Status: AC
  Administered 2018-03-19: 5 mg via ORAL
  Filled 2018-03-19: qty 1

## 2018-03-19 MED ORDER — ROCURONIUM BROMIDE 100 MG/10ML IV SOLN
INTRAVENOUS | Status: DC | PRN
  Administered 2018-03-19: 20 mg via INTRAVENOUS
  Administered 2018-03-19: 30 mg via INTRAVENOUS
  Administered 2018-03-19 (×2): 50 mg via INTRAVENOUS

## 2018-03-19 MED ORDER — NEOMYCIN-POLYMYXIN B GU 40-200000 IR SOLN
Status: DC | PRN
  Administered 2018-03-19: 4 mL

## 2018-03-19 MED ORDER — MIDAZOLAM HCL 2 MG/2ML IJ SOLN
INTRAMUSCULAR | Status: DC | PRN
  Administered 2018-03-19: 2 mg via INTRAVENOUS

## 2018-03-19 MED ORDER — CEFAZOLIN SODIUM-DEXTROSE 2-4 GM/100ML-% IV SOLN
2.0000 g | INTRAVENOUS | Status: DC
  Filled 2018-03-19: qty 100

## 2018-03-19 MED ORDER — SODIUM CHLORIDE 0.9 % IJ SOLN
INTRAMUSCULAR | Status: AC
  Filled 2018-03-19: qty 10

## 2018-03-19 MED ORDER — LIDOCAINE HCL (PF) 2 % IJ SOLN
INTRAMUSCULAR | Status: AC
  Filled 2018-03-19: qty 10

## 2018-03-19 MED ORDER — LIDOCAINE HCL (CARDIAC) 20 MG/ML IV SOLN
INTRAVENOUS | Status: DC | PRN
  Administered 2018-03-19: 100 mg via INTRAVENOUS

## 2018-03-19 MED ORDER — SUGAMMADEX SODIUM 500 MG/5ML IV SOLN
INTRAVENOUS | Status: AC
  Filled 2018-03-19: qty 5

## 2018-03-19 MED ORDER — IBUPROFEN 800 MG PO TABS: 800.0000 mg | ORAL_TABLET | Freq: Three times a day (TID) | ORAL | 1 refills | Status: DC | PRN

## 2018-03-19 MED ORDER — OXYCODONE HCL 5 MG PO TABS
5.0000 mg | ORAL_TABLET | Freq: Once | ORAL | Status: AC | PRN
  Administered 2018-03-19: 5 mg via ORAL

## 2018-03-19 MED ORDER — SEVOFLURANE IN SOLN
RESPIRATORY_TRACT | Status: AC
  Filled 2018-03-19: qty 250

## 2018-03-19 MED ORDER — LACTATED RINGERS IV SOLN: INTRAVENOUS | Status: DC

## 2018-03-19 MED ORDER — PHENYLEPHRINE HCL 10 MG/ML IJ SOLN
INTRAMUSCULAR | Status: DC | PRN
  Administered 2018-03-19: 100 ug via INTRAVENOUS

## 2018-03-19 MED ORDER — BUPIVACAINE HCL (PF) 0.5 % IJ SOLN
INTRAMUSCULAR | Status: AC
  Filled 2018-03-19: qty 30

## 2018-03-19 SURGICAL SUPPLY — 119 items
APPLIER CLIP 11 MED OPEN (CLIP) ×3
APPLIER CLIP 13 LRG OPEN (CLIP)
BAG DECANTER FOR FLEXI CONT (MISCELLANEOUS) ×2 IMPLANT
BAG URINE DRAINAGE (UROLOGICAL SUPPLIES) ×3 IMPLANT
BANDAGE ELASTIC 6 LF NS (GAUZE/BANDAGES/DRESSINGS) ×2 IMPLANT
BINDER BREAST LRG (GAUZE/BANDAGES/DRESSINGS) ×2 IMPLANT
BINDER BREAST MEDIUM (GAUZE/BANDAGES/DRESSINGS) ×3 IMPLANT
BINDER BREAST XLRG (GAUZE/BANDAGES/DRESSINGS) ×2 IMPLANT
BINDER BREAST XXLRG (GAUZE/BANDAGES/DRESSINGS) ×2 IMPLANT
BLADE BOVIE TIP EXT 4 (BLADE) ×2 IMPLANT
BLADE PHOTON ILLUMINATED (MISCELLANEOUS) ×1 IMPLANT
BLADE SURG 15 STRL LF DISP TIS (BLADE) ×2 IMPLANT
BLADE SURG 15 STRL SS (BLADE) ×1
BLADE SURG 15 STRL SS SAFETY (BLADE) ×3 IMPLANT
BLADE SURG SZ11 CARB STEEL (BLADE) ×3 IMPLANT
BNDG GAUZE 4.5X4.1 6PLY STRL (MISCELLANEOUS) ×6 IMPLANT
BULB RESERV EVAC DRAIN JP 100C (MISCELLANEOUS) ×5 IMPLANT
CANISTER SUCT 1200ML W/VALVE (MISCELLANEOUS) ×5 IMPLANT
CATH FOLEY 2WAY  5CC 16FR (CATHETERS) ×1
CATH URTH 16FR FL 2W BLN LF (CATHETERS) ×2 IMPLANT
CHLORAPREP W/TINT 26ML (MISCELLANEOUS) ×11 IMPLANT
CLIP APPLIE 11 MED OPEN (CLIP) IMPLANT
CLIP APPLIE 13 LRG OPEN (CLIP) IMPLANT
CNTNR SPEC 2.5X3XGRAD LEK (MISCELLANEOUS)
CONT SPEC 4OZ STER OR WHT (MISCELLANEOUS)
CONTAINER SPEC 2.5X3XGRAD LEK (MISCELLANEOUS) ×6 IMPLANT
DECANTER SPIKE VIAL GLASS SM (MISCELLANEOUS) IMPLANT
DERMABOND ADVANCED (GAUZE/BANDAGES/DRESSINGS)
DERMABOND ADVANCED .7 DNX12 (GAUZE/BANDAGES/DRESSINGS) ×8 IMPLANT
DEVICE DUBIN SPECIMEN MAMMOGRA (MISCELLANEOUS) ×3 IMPLANT
DRAIN CHANNEL 19F RND (DRAIN) ×4 IMPLANT
DRAIN CHANNEL JP 15F RND 16 (MISCELLANEOUS) IMPLANT
DRAIN CHANNEL JP 19F (MISCELLANEOUS) ×1 IMPLANT
DRAPE LAPAROTOMY TRNSV 106X77 (MISCELLANEOUS) ×3 IMPLANT
DRSG GAUZE FLUFF 36X18 (GAUZE/BANDAGES/DRESSINGS) ×2 IMPLANT
DRSG TELFA 3X8 NADH (GAUZE/BANDAGES/DRESSINGS) IMPLANT
ELECT CAUTERY BLADE 6.4 (BLADE) ×3 IMPLANT
ELECT CAUTERY BLADE TIP 2.5 (TIP) ×3
ELECT REM PT RETURN 9FT ADLT (ELECTROSURGICAL) ×3
ELECTRODE CAUTERY BLDE TIP 2.5 (TIP) ×4 IMPLANT
ELECTRODE REM PT RTRN 9FT ADLT (ELECTROSURGICAL) ×2 IMPLANT
GAUZE SPONGE 4X4 12PLY STRL (GAUZE/BANDAGES/DRESSINGS) ×3 IMPLANT
GLOVE BIO SURGEON STRL SZ 6.5 (GLOVE) ×6 IMPLANT
GLOVE BIO SURGEON STRL SZ7 (GLOVE) ×6 IMPLANT
GLOVE BIO SURGEON STRL SZ7.5 (GLOVE) ×6 IMPLANT
GLOVE INDICATOR 7.5 STRL GRN (GLOVE) ×3 IMPLANT
GLOVE INDICATOR 8.0 STRL GRN (GLOVE) ×5 IMPLANT
GOWN STRL REUS W/ TWL LRG LVL3 (GOWN DISPOSABLE) ×16 IMPLANT
GOWN STRL REUS W/TWL LRG LVL3 (GOWN DISPOSABLE) ×2
GRAFT FLEX HD 4X16 THICK (Tissue Mesh) ×5 IMPLANT
IMPL EXPANDER BREAST 455CC (Breast) IMPLANT
IMPLANT BREAST 455CC (Breast) ×1 IMPLANT
IMPLANT EXPANDER BREAST 455CC (Breast) ×2 IMPLANT
IRRIGATION STRYKERFLOW (MISCELLANEOUS) ×2 IMPLANT
IRRIGATOR STRYKERFLOW (MISCELLANEOUS)
IV NS 1000ML (IV SOLUTION) ×1
IV NS 1000ML BAXH (IV SOLUTION) ×4 IMPLANT
IV NS 500ML (IV SOLUTION) ×1
IV NS 500ML BAXH (IV SOLUTION) ×2 IMPLANT
KIT PINK PAD W/HEAD ARE REST (MISCELLANEOUS) ×3
KIT PINK PAD W/HEAD ARM REST (MISCELLANEOUS) ×2 IMPLANT
KIT TURNOVER CYSTO (KITS) ×3 IMPLANT
LABEL OR SOLS (LABEL) ×4 IMPLANT
LIGASURE VESSEL 5MM BLUNT TIP (ELECTROSURGICAL) ×1 IMPLANT
NDL 21 GA WING INFUSION (NEEDLE) ×24 IMPLANT
NDL HYPO 25X1 1.5 SAFETY (NEEDLE) IMPLANT
NEEDLE 21 GA WING INFUSION (NEEDLE) ×6 IMPLANT
NEEDLE HYPO 25X1 1.5 SAFETY (NEEDLE) ×3 IMPLANT
NS IRRIG 500ML POUR BTL (IV SOLUTION) ×3 IMPLANT
PACK BASIN MAJOR ARMC (MISCELLANEOUS) ×3 IMPLANT
PACK BASIN MINOR ARMC (MISCELLANEOUS) ×3 IMPLANT
PACK GYN LAPAROSCOPIC (MISCELLANEOUS) ×3 IMPLANT
PACK UNIVERSAL (MISCELLANEOUS) ×2 IMPLANT
PAD ABD DERMACEA PRESS 5X9 (GAUZE/BANDAGES/DRESSINGS) ×8 IMPLANT
PAD DRESSING TELFA 3X8 NADH (GAUZE/BANDAGES/DRESSINGS) ×2 IMPLANT
PAD OB MATERNITY 4.3X12.25 (PERSONAL CARE ITEMS) ×3 IMPLANT
PAD PREP 24X41 OB/GYN DISP (PERSONAL CARE ITEMS) ×7 IMPLANT
PIN SAFETY STRL (MISCELLANEOUS) ×5 IMPLANT
POUCH SPECIMEN RETRIEVAL 10MM (ENDOMECHANICALS) ×1 IMPLANT
SCISSORS METZENBAUM CVD 33 (INSTRUMENTS) IMPLANT
SET ASEPTIC TRANSFER (MISCELLANEOUS) ×7 IMPLANT
SHEARS FOC LG CVD HARMONIC 17C (MISCELLANEOUS) ×1 IMPLANT
SLEEVE ENDOPATH XCEL 5M (ENDOMECHANICALS) ×3 IMPLANT
SLEVE PROBE SENORX GAMMA FIND (MISCELLANEOUS) ×2 IMPLANT
SPONGE LAP 18X18 5 PK (GAUZE/BANDAGES/DRESSINGS) ×10 IMPLANT
STRIP CLOSURE SKIN 1/2X4 (GAUZE/BANDAGES/DRESSINGS) ×5 IMPLANT
STRIP CLOSURE SKIN 1/4X4 (GAUZE/BANDAGES/DRESSINGS) ×2 IMPLANT
SUT ETHILON 3-0 FS-10 30 BLK (SUTURE)
SUT MNCRL 3-0 UNDYED SH (SUTURE) ×4 IMPLANT
SUT MNCRL AB 4-0 PS2 18 (SUTURE) ×13 IMPLANT
SUT MNCRL+ 5-0 UNDYED PC-3 (SUTURE) ×8 IMPLANT
SUT MONOCRYL 3-0 UNDYED (SUTURE) ×2
SUT MONOCRYL 5-0 (SUTURE) ×1
SUT PDS 3-0 (SUTURE) ×1
SUT PDS AB 1 CT1 27 (SUTURE) ×1 IMPLANT
SUT PDS PLUS AB 3-0 PC-5 (SUTURE) IMPLANT
SUT SILK 0 (SUTURE) ×1
SUT SILK 0 30XBRD TIE 6 (SUTURE) ×2 IMPLANT
SUT SILK 3 0 (SUTURE)
SUT SILK 3 0 SH 30 (SUTURE) ×6 IMPLANT
SUT SILK 3-0 18XBRD TIE 12 (SUTURE) ×2 IMPLANT
SUT VIC AB 2-0 CT1 27 (SUTURE) ×1
SUT VIC AB 2-0 CT1 TAPERPNT 27 (SUTURE) ×8 IMPLANT
SUT VIC AB 2-0 UR6 27 (SUTURE) ×3 IMPLANT
SUT VIC AB 3-0 SH 27 (SUTURE)
SUT VIC AB 3-0 SH 27X BRD (SUTURE) ×2 IMPLANT
SUT VIC AB 4-0 SH 27 (SUTURE) ×1
SUT VIC AB 4-0 SH 27XANBCTRL (SUTURE) ×2 IMPLANT
SUT VICRYL+ 3-0 144IN (SUTURE) ×3 IMPLANT
SUTURE EHLN 3-0 FS-10 30 BLK (SUTURE) ×2 IMPLANT
SWABSTK COMLB BENZOIN TINCTURE (MISCELLANEOUS) ×3 IMPLANT
SYR BULB IRRIG 60ML STRL (SYRINGE) ×3 IMPLANT
SYR CONTROL 10ML (SYRINGE) IMPLANT
TAPE TRANSPORE STRL 2 31045 (GAUZE/BANDAGES/DRESSINGS) ×2 IMPLANT
TOWEL OR 17X26 4PK STRL BLUE (TOWEL DISPOSABLE) ×3 IMPLANT
TROCAR ENDO BLADELESS 11MM (ENDOMECHANICALS) ×1 IMPLANT
TROCAR XCEL NON-BLD 5MMX100MML (ENDOMECHANICALS) ×3 IMPLANT
TUBING INSUFFLATION (TUBING) ×3 IMPLANT
centerscope magnetic detection device ×1 IMPLANT

## 2018-03-19 NOTE — H&P (Signed)
37 year old woman s/p neo-adjuvant chemotherapy for a hormone receptor positive cancer. Plans for right mastectomy with immediate reconstruction ( Dr. Marla Roe) and bilateral laparoscopic oophorectomy (Dr. [name]).  Procedures reviewed with patient. Case has been discussed personally with Drs. Beasley and Dillingham.  No change in clinical exam since March 08, 2018 office visit.

## 2018-03-19 NOTE — H&P (Signed)
Theresa Chaney is an 37 y.o. female.   Chief Complaint: breast cancer HPI: The patient is a 37 y.o. yrs old wf here for breast reconstruction.  She noticed a right breast mass which lead to mammogram, ultrasound and biopsy.  The stereotactic biopsy from 10/30 showed a carcinoma in situ at the 11:30 and 5:30 position.  There was invasive carcinoma as well with ER positive and PR negative, HER-2 negative results.   The chemo did not change the size of the lesions so surgery with Dr. Bary Castilla is planned.  No sign of infection.  She will likely have more chemo after the right mastectomy with expander and FlexHD placement. She is 5 feet 7 inches tall, weight is 168 pounds and preop bra is 38 B.  She has a 72 yrs old daughter.  She has mild ptosis of the breasts.   Past Medical History:  Diagnosis Date  . Anemia   . BRCA negative 11/26/2017  . Breast cancer ([place]) 10/11/2017   Multifocal, ER positive, PR negative, HER-2 negative.  . Chronic bronchitis ([place]) 11/2017  . COPD (chronic obstructive pulmonary disease) ([place])    MILD PER CXR  . Depression   . Family history of cancer   . GERD (gastroesophageal reflux disease)   . Headache    MIGRAINES  . Heart murmur    ASYMPTOMATIC  . Personal history of chemotherapy    current for right breast ca    Past Surgical History:  Procedure Laterality Date  . BREAST BIOPSY Right 10/11/2017   12:30 posterior coil clip invasive mammary carcinoma  . BREAST BIOPSY Right 10/11/2017   11:30 middle depth ribbon clip DCIS  . BREAST BIOPSY Right 10/11/2017   5:30 anterior depth x shape invasive ductal carcinoma  . PORTACATH PLACEMENT Left 10/24/2017   Procedure: INSERTION PORT-A-CATH;  Surgeon: Robert Bellow, MD;  Location: [facility] ORS;  Service: General;  Laterality: Left;    Family History  Problem Relation Age of Onset  . Melanoma Maternal Aunt        other aunts with BCC/SCC/Melanoma  . Diabetes Father   . Hypertension Father   . Hyperlipidemia  Father   . Bladder Cancer Maternal Grandmother   . Cervical cancer Maternal Aunt 64       daughter w/ cervical cancer as well  . Melanoma Maternal Uncle        other uncles with BCC/SCC/Melanoma   Social History:  reports that she has been smoking cigarettes.  She has a 2.50 pack-year smoking history. She has never used smokeless tobacco. She reports that she does not drink alcohol or use drugs.  Allergies: No Known Allergies  Medications Prior to Admission  Medication Sig Dispense Refill  . albuterol (PROVENTIL HFA;VENTOLIN HFA) 108 (90 Base) MCG/ACT inhaler Inhale 2 puffs into the lungs every 6 (six) hours as needed for wheezing or shortness of breath (Cough). 1 Inhaler 2  . ALPRAZolam (XANAX) 0.25 MG tablet Take 0.25 mg at bedtime as needed by mouth for anxiety or sleep.     . Calcium Carb-Cholecalciferol (CALCIUM 600 + D PO) Take 1 tablet daily by mouth.    . docusate sodium (COLACE) 100 MG capsule Take 1 capsule (100 mg total) by mouth 2 (two) times daily. (Patient taking differently: Take 100 mg by mouth 2 (two) times daily. While on chemo) 60 capsule 0  . doxylamine, Sleep, (UNISOM) 25 MG tablet Take 25 mg by mouth at bedtime as needed for sleep.    [name] escitalopram (  LEXAPRO) 20 MG tablet Take 20 mg by mouth every morning.     [name] esomeprazole (NEXIUM) 40 MG capsule Take 40 mg by mouth every morning.     [name] ibuprofen (ADVIL,MOTRIN) 800 MG tablet Take 800 mg by mouth daily as needed for moderate pain.    [name] lidocaine-prilocaine (EMLA) cream Apply to affected area once (Patient taking differently: Apply 1 application topically daily as needed (port access). ) 30 g 3  . loratadine (CLARITIN) 10 MG tablet Take 10 mg by mouth daily.     . prochlorperazine (COMPAZINE) 10 MG tablet Take 1 tablet (10 mg total) by mouth every 6 (six) hours as needed (Nausea or vomiting). 30 tablet 1  . promethazine (PHENERGAN) 25 MG tablet Take 1 tablet (25 mg total) by mouth every 6 (six) hours as needed for nausea  or vomiting. 30 tablet 0  . chlorhexidine (PERIDEX) 0.12 % solution USE AS DIRECTED 15MLS IN THE MOUTH OR THROAT TWICE A DAY (Patient not taking: Reported on 03/19/2018) 473 mL 1  . lidocaine (XYLOCAINE) 2 % solution Use as directed 10 mLs in the mouth or throat every 4 (four) hours as needed for mouth pain. (Patient not taking: Reported on 03/19/2018) 200 mL 0    Results for orders placed or performed during the hospital encounter of 03/19/18 (from the past 48 hour(s))  Pregnancy, urine POC     Status: None   Collection Time: 03/19/18  8:25 AM  Result Value Ref Range   Preg Test, Ur NEGATIVE NEGATIVE    Comment:        THE SENSITIVITY OF THIS METHODOLOGY IS >24 mIU/mL   I-STAT 4, (NA,K, GLUC, HGB,HCT)     Status: None   Collection Time: 03/19/18  8:51 AM  Result Value Ref Range   Sodium 142 135 - 145 mmol/L   Potassium 3.5 3.5 - 5.1 mmol/L   Glucose, Bld 97 65 - 99 mg/dL   HCT 38.0 36.0 - 46.0 %   Hemoglobin 12.9 12.0 - 15.0 g/dL  ABO/Rh     Status: None   Collection Time: 03/19/18  9:22 AM  Result Value Ref Range   ABO/RH(D)      A POS Performed at [facility], [address] [place], [place] [address]    Nm Sentinel Node Injection  Result Date: 03/19/2018 CLINICAL DATA:  Right breast cancer. EXAM: NUCLEAR MEDICINE BREAST LYMPHOSCINTIGRAPHY RIGHT TECHNIQUE: Intradermal injection of radiopharmaceutical was performed at the 12 o'clock, 3 o'clock, 6 o'clock, and 9 o'clock positions around the RIGHT nipple. The patient was then sent to the operating room where the sentinel node(s) were identified and removed by the surgeon. RADIOPHARMACEUTICALS:  Total of 1 mCi Millipore-filtered Technetium-15msulfur colloid, injected in four aliquots of 0.25 mCi each. IMPRESSION: Uncomplicated intradermal injection of a total of 1 mCi Technetium-974mulfur colloid for purposes of sentinel node identification. Electronically Signed   By: M.Jerilynn Mages Shick M.D.   On: 03/19/2018 08:43    Review of  Systems  Constitutional: Negative.   HENT: Negative.   Eyes: Negative.   Respiratory: Negative.   Cardiovascular: Negative.   Gastrointestinal: Negative.   Genitourinary: Negative.   Musculoskeletal: Negative.   Skin: Negative.   Neurological: Negative.   Psychiatric/Behavioral: Negative.     Blood pressure 117/69, pulse 78, temperature (!) 97.1 F (36.2 C), temperature source Oral, resp. rate 16, height _0  (1.676 m), weight 77.6 kg (171 lb). Physical Exam  Constitutional: She is oriented to person, place, and  time. She appears well-developed and well-nourished.  HENT:  Head: Normocephalic and atraumatic.  Eyes: Pupils are equal, round, and reactive to light. EOM are normal.  Cardiovascular: Normal rate.  Respiratory: Effort normal.  GI: Soft.  Neurological: She is alert and oriented to person, place, and time.  Skin: Skin is warm.  Psychiatric: She has a normal mood and affect. Her behavior is normal. Thought content normal.     Assessment/Plan  Plan for right breast immediate reconstruction with expander and FlexHD placement. The risks that can be encountered with and after placement of a breast expander placement were discussed and include the following but not limited to these: bleeding, infection, delayed healing, anesthesia risks, skin sensation changes, injury to structures including nerves, blood vessels, and muscles which may be temporary or permanent, allergies to tape, suture materials and glues, blood products, topical preparations or injected agents, skin contour irregularities, skin discoloration and swelling, deep vein thrombosis, cardiac and pulmonary complications, pain, which may persist, fluid accumulation, wrinkling of the skin over the expander, changes in nipple or breast sensation, expander leakage or rupture, faulty position of the expander, persistent pain, formation of tight scar tissue around the expander (capsular contracture), possible need for  revisional surgery or staged procedures.  Bass[place], DO 03/19/2018, 10:17 AM

## 2018-03-19 NOTE — Anesthesia Procedure Notes (Signed)
Procedure Name: Intubation Date/Time: 03/19/2018 10:23 AM Performed by: Doreen Salvage, CRNA Pre-anesthesia Checklist: Patient identified, Patient being monitored, Timeout performed, Emergency Drugs available and Suction available Patient Re-evaluated:Patient Re-evaluated prior to induction Oxygen Delivery Method: Circle system utilized Preoxygenation: Pre-oxygenation with 100% oxygen Induction Type: IV induction Ventilation: Mask ventilation without difficulty Laryngoscope Size: Mac and 3 Grade View: Grade I Tube type: Oral Tube size: 7.0 mm Number of attempts: 1 Airway Equipment and Method: Stylet Placement Confirmation: ETT inserted through vocal cords under direct vision,  positive ETCO2 and breath sounds checked- equal and bilateral Secured at: 21 cm Tube secured with: Tape Dental Injury: Teeth and Oropharynx as per pre-operative assessment

## 2018-03-19 NOTE — Transfer of Care (Signed)
  Anesthesia Post-op Note  Patient: Theresa Chaney  Procedure(s) Performed: Procedure(s): MASTECTOMY WITH SENTINEL LYMPH NODE BIOPSY (Right) BREAST RECONSTRUCTION WITH PLACEMENT OF TISSUE EXPANDER AND FLEX HD (ACELLULAR HYDRATED DERMIS) (Right) LAPAROSCOPIC BILATERAL SALPINGO OOPHORECTOMY (Bilateral) AXILLARY LYMPH NODE DISSECTION (Right)  Patient Location: Mother/Baby  Anesthesia Type:Epidural  Level of Consciousness: awake, alert  and oriented  Airway and Oxygen Therapy: Patient Spontanous Breathing  Post-op Pain: 0 /10  Post-op Assessment: Post-op Vital signs reviewed, Patent Airway, No signs of Nausea or vomiting and Pain level controlled  Post-op Vital Signs: Reviewed and stable  Last Vitals:  Vitals:   03/19/18 1439 03/19/18 1440  BP:  124/77  Pulse: (!) 115 (!) 117  Resp: (!) 21 16  Temp: 36.9 C   SpO2: 91% 504%    Complications: No apparent anesthesia complications

## 2018-03-19 NOTE — Op Note (Signed)
Op report    DATE OF OPERATION:  03/19/2018  LOCATION: [facility]  SURGICAL DIVISION: Plastic Surgery  PREOPERATIVE DIAGNOSES:  1. Right Breast cancer.    POSTOPERATIVE DIAGNOSES:  1. Right Breast cancer.   PROCEDURE:  1. Right immediate breast reconstruction with placement of Acellular Dermal Matrix and tissue expanders.  SURGEON: Ailis Rigaud Sanger Tresea Heine, DO  ANESTHESIA:  General.   COMPLICATIONS: None.   IMPLANTS: Right - Mentor 450 cc. Ref #[id number].  LOG: [id number], 150 cc of saline placed in the expander Acellular Dermal Matrix 4 x 16 cm  INDICATIONS FOR PROCEDURE:  The patient, Theresa Chaney, is a 37 y.o. female born on [birth year], is here for immediate first stage breast reconstruction with placement of right tissue expander and Acellular dermal matrix. MRN: [id number]  CONSENT:  Informed consent was obtained directly from the patient. Risks, benefits and alternatives were fully discussed. Specific risks including but not limited to bleeding, infection, hematoma, seroma, scarring, pain, implant infection, implant extrusion, capsular contracture, asymmetry, wound healing problems, and need for further surgery were all discussed. The patient did have an ample opportunity to have her questions answered to her satisfaction.   DESCRIPTION OF PROCEDURE:  The patient was taken to the operating room by the general surgery team. SCDs were placed and IV antibiotics were given. The patient's chest was prepped and draped in a sterile fashion. A time out was performed and the implants to be used were identified.  A right mastectomy was performed.  Once the general surgery team had completed their portion of the case the patient was rendered to the plastic and reconstructive surgery team.  Right:  The pectoralis major muscle was lifted from the chest wall with release of the lateral edge and lateral inframammary fold.  The pocket was irrigated with antibiotic solution and  hemostasis was achieved with electrocautery.  The ADM was then prepared according to the manufacture guidelines and slits placed to help with postoperative fluid management.  The ADM was then sutured to the inferior and lateral edge of the inframammary fold with 2-0 PDS starting with an interrupted stitch and then a running stitch.  The lateral portion was sutured to with interrupted sutures after the expander was placed.  The expander was prepared according to the manufacture guidelines, the air evacuated and then it was placed under the ADM and pectoralis major muscle.  The inferior and lateral tabs were used to secure the expander to the chest wall with 2-0 PDS.  The drain was placed at the inframammary fold over the ADM and secured to the skin with 3-0 Silk.    The path then indicated a positive SLN and the decision was made for a complete lymph node dissection.  This was performed by Dr. Bary Castilla.  The patient was then rendered to the plastic surgery team again.  The deep layers were closed with 3-0 Monocryl followed by 4-0 Monocryl.  The skin was closed with 5-0 Monocryl and then dermabond was applied.  The ABD was placed with a sterile towel.  The patient was then rendered to the GYN service for their portion of the case.  At the end of the case the breast binder was placed.  The patient tolerated the procedure well and there were no complications.  The family was notified of the patient's progress.

## 2018-03-19 NOTE — Op Note (Signed)
[facility] PROCEDURE DATE: 03/19/2018  PREOPERATIVE DIAGNOSIS: Desires oophorectomy  POSTOPERATIVE DIAGNOSIS: Same PROCEDURE: Diagnostic laparoscopy, bilateral salpingoopherectomy SURGEON:  Dr. Benjaman Kindler ASSISTANT: CST ANESTHESIOLOGIST: Alphonsus Sias, MD Anesthesiologist: Alphonsus Sias, MD; Emmie Niemann, MD CRNA: Doreen Salvage, CRNA; Nelda Marseille, CRNA  INDICATIONS: 37 y.o. G1P1 with history of ER positive breast cancer, desiring surgical BSO.   Please see preoperative notes for further details.  Risks of surgery were discussed with the patient including but not limited to: bleeding which may require transfusion or reoperation; infection which may require antibiotics; injury to bowel, bladder, ureters or other surrounding organs; need for additional procedures including laparotomy; thromboembolic phenomenon, incisional problems and other postoperative/anesthesia complications. Written informed consent was obtained.    FINDINGS:  Small uterus, normal ovaries and fallopian tubes bilaterally.  No other abdominal/pelvic abnormality.  Normal upper abdomen.  ANESTHESIA:    General INTRAVENOUS FLUIDS: 1200 ml ESTIMATED BLOOD LOSS: minimal URINE OUTPUT: 100 ml SPECIMENS: Bilateral ovaries and tubes COMPLICATIONS: None immediate  PROCEDURE IN DETAIL:  The patient had sequential compression devices applied to her lower extremities while in the preoperative area.  She was then taken to the operating room where general anesthesia was administered and was found to be adequate.  She was placed in the dorsal lithotomy position, and was prepped and draped in a sterile manner.  After an adequate timeout was performed, a Foley catheter was inserted into her bladder and attached to constant drainage and a tenaculum placed on the anterior lip of the cervix. A uterine sound was then advanced into the uterus. This was fixed to the single-toothed tenaculum  Attention was turned to the abdomen where an  umbilical incision was made with the scalpel.  The Optiview 10-mm trocar and sleeve were then advanced without difficulty with the laparoscope under direct visualization into the abdomen.  The abdomen was then insufflated with carbon dioxide gas and adequate pneumoperitoneum was obtained.   A detailed survey of the patient's pelvis and abdomen revealed the findings as mentioned above.  An additional 20mm trocar was placed in the left and right lower quadrants under direct visualization.  Evaluation of the pelvic sidewalls to observe the course of the ureters was undertaken, assuring the ureter was outside the operative field.   Bilateral oophorectomy: The Right adnexa was elevated away from the pelvic sidewall. Using a Ligasure electrocautery device, the IP ligament was skeletonized, triply clamped, cauterized and transected to remove the ovary and fallopian tube in its entirety. The same procedure was completed on the left.  The operative site was surveyed, and it was found to be hemostatic.  No intraoperative injury to surrounding organs was noted. The fascia of the 10 mm trocar site was closed with 0 Vicryl.   Pictures were taken of the quadrants and pelvis. The abdomen was desufflated and all instruments were then removed from the patient's abdomen. The uterine manipulator was removed without complications.  All incisions were closed with 4-0 Vicryl and Dermabond.   The patient tolerated the procedures well.  All instruments, needles, and sponge counts were correct x 2. The patient was taken to the recovery room in stable condition.

## 2018-03-19 NOTE — Anesthesia Preprocedure Evaluation (Signed)
Anesthesia Evaluation  Patient identified by MRN, date of birth, ID band Patient awake    Reviewed: Allergy & Precautions, NPO status , Patient's Chart, lab work & pertinent test results  History of Anesthesia Complications Negative for: history of anesthetic complications  Airway Mallampati: I  TM Distance: >3 FB Neck ROM: Full    Dental no notable dental hx.    Pulmonary COPD (mild), Current Smoker,    breath sounds clear to auscultation- rhonchi (-) wheezing      Cardiovascular Exercise Tolerance: Good (-) hypertension(-) CAD, (-) Past MI, (-) Cardiac Stents and (-) CABG  Rhythm:Regular Rate:Normal - Systolic murmurs and - Diastolic murmurs    Neuro/Psych  Headaches, PSYCHIATRIC DISORDERS Depression    GI/Hepatic Neg liver ROS, GERD  ,  Endo/Other  negative endocrine ROSneg diabetes  Renal/GU negative Renal ROS     Musculoskeletal negative musculoskeletal ROS (+)   Abdominal (+) - obese,   Peds  Hematology  (+) anemia ,   Anesthesia Other Findings Past Medical History: No date: Anemia 11/26/2017: BRCA negative 10/11/2017: Breast cancer ([place])     Comment:  Multifocal, ER positive, PR negative, HER-2 negative. 11/2017: Chronic bronchitis ([facility]) No date: COPD (chronic obstructive pulmonary disease) (HCC)     Comment:  MILD PER CXR No date: Depression No date: Family history of cancer No date: GERD (gastroesophageal reflux disease) No date: Headache     Comment:  MIGRAINES No date: Heart murmur     Comment:  ASYMPTOMATIC No date: Personal history of chemotherapy     Comment:  current for right breast ca   Reproductive/Obstetrics                             Anesthesia Physical Anesthesia Plan  ASA: III  Anesthesia Plan: General   Post-op Pain Management:    Induction: Intravenous  PONV Risk Score and Plan: 1 and Ondansetron, Dexamethasone and Midazolam  Airway Management  Planned: Oral ETT  Additional Equipment:   Intra-op Plan:   Post-operative Plan: Extubation in OR  Informed Consent: I have reviewed the patients History and Physical, chart, labs and discussed the procedure including the risks, benefits and alternatives for the proposed anesthesia with the patient or authorized representative who has indicated his/her understanding and acceptance.   Dental advisory given  Plan Discussed with: CRNA and Anesthesiologist  Anesthesia Plan Comments:         Anesthesia Quick Evaluation

## 2018-03-19 NOTE — Anesthesia Post-op Follow-up Note (Signed)
Anesthesia QCDR form completed.        

## 2018-03-19 NOTE — Op Note (Signed)
Preoperative diagnosis: Right breast cancer.  Postoperative diagnosis: Same.  Operative procedure: Right simple mastectomy, sentinel node biopsy, axillary dissection.  Operating Surgeon: Hervey Ard  Assistant: Mohammed Kindle, DO.  Anesthesia: General endotracheal.  Estimated blood loss: Less than 100 cc.  Clinical note: This 37 year old woman was identified with multifocal cancer of the right breast.  She underwent neoadjuvant chemotherapy with modest improvement in tumor volume.  She is brought to the operating at this time for planned mastectomy with immediate reconstruction.  The patient was injected with technetium sulfur colloid prior to the procedure.  The patient received preoperative antibiotics as well as SCD stockings for DVT prevention.  Operative note: The patient underwent general endotracheal anesthesia without difficulty.  The right chest axilla and neck was cleansed with ChloraPrep and draped after instilling 5 cc of 0.5% methylene blue in the subareolar plexus.  The wound was draped and an elliptical incision outlined just outside the edge of the areola.  The skin was incised sharply and the remaining dissection was completed with the photon blade.  There was significant vascularity especially in the upper flap and this was controlled with electrocautery and 3-0 Vicryl ties.  5 mm flaps were maintained and extended to just below the clavicle superiorly, sternum medially, rectus fascia inferiorly and the serratus muscle laterally.  The pectoralis fascia was elevated with the specimen.  Attention was turned to the axilla and node seeker device used to identify several nodes with counts up to 4500.  These were taken to pathology and initial touch preps on the largest, first node was positive for metastatic disease and positive on frozen section for macro metastatic disease.  While pathology was pending at the beginning reconstruction was undertaken by Dr. Elisabeth Cara which  will be dictated separately.  With the finding of macro metastatic disease in the axilla it was elected to complete the axillary dissection.  Making use of the harmonic scalpel and a Thompson retractor the axilla was exposed.  The superior border of the dissection was the axillary vein.  The long thoracic nerve of Bell and thoracodorsal nerve artery and vein bundles were identified and protected.  Both nerves were functional at the end of the procedure.  The axillary contents were swept inferiorly.  2 clips were placed at the apex of the axilla near the medial border of the pectoralis minor muscle as the medial edge of dissection for later radiologic imaging.  The axillary contents were swept inferiorly and laterally and removed.  These were sent in formalin for routine histology.  The remaining breast reconstruction was completed by Dr. Elisabeth Cara and will be dictated separately.

## 2018-03-20 ENCOUNTER — Encounter: Payer: Self-pay | Admitting: General Surgery

## 2018-03-20 DIAGNOSIS — C50411 Malignant neoplasm of upper-outer quadrant of right female breast: Secondary | ICD-10-CM | POA: Diagnosis not present

## 2018-03-20 NOTE — Anesthesia Postprocedure Evaluation (Signed)
Anesthesia Post Note  Patient: Theresa Chaney  Procedure(s) Performed: MASTECTOMY WITH SENTINEL LYMPH NODE BIOPSY (Right ) AXILLARY LYMPH NODE DISSECTION (Right ) BREAST RECONSTRUCTION WITH PLACEMENT OF TISSUE EXPANDER AND FLEX HD (ACELLULAR HYDRATED DERMIS) (Right ) LAPAROSCOPIC BILATERAL SALPINGO OOPHORECTOMY (Bilateral )  Patient location during evaluation: PACU Anesthesia Type: General Level of consciousness: awake and alert Pain management: pain level controlled Vital Signs Assessment: post-procedure vital signs reviewed and stable Respiratory status: spontaneous breathing, nonlabored ventilation, respiratory function stable and patient connected to nasal cannula oxygen Cardiovascular status: blood pressure returned to baseline and stable Postop Assessment: no apparent nausea or vomiting Anesthetic complications: no     Last Vitals:  Vitals:   03/19/18 2006 03/20/18 0541  BP: 114/74 110/73  Pulse: 88 72  Resp: 20 18  Temp: 37.1 C 36.4 C  SpO2: 97% 98%    Last Pain:  Vitals:   03/20/18 0642  TempSrc:   PainSc: Asleep                 Alphonsus Sias

## 2018-03-20 NOTE — Final Progress Note (Signed)
AVSS. Sore more than pain. Comfortable with drain care. ABD: Soft. Breast flaps: Healthy. Drainage: 80 cc serosang. Reviewed home meds, RX provided previously by Dr. Marla Roe. Reviewed activity restrictions re: right shoulder.  Home today.

## 2018-03-27 ENCOUNTER — Telehealth: Payer: Self-pay | Admitting: General Surgery

## 2018-03-27 NOTE — Telephone Encounter (Signed)
Patient was notified of pathology results.  Margins clear.  Close margin posterior, fascia.  4 out of 15 lymph nodes,3 with macro metastatic disease, one with micrometastatic disease.  Lymphovascular invasion noted.     Patient reports essentially pain-free.  Drainage volume about 30 cc/day.  She will be seeing Dr. Marla Roe this morning.  If the drains not removed today, will ask for Dr. Eusebio Friendly criteria for removal next week.

## 2018-03-29 ENCOUNTER — Encounter: Payer: Self-pay | Admitting: *Deleted

## 2018-04-02 ENCOUNTER — Encounter: Payer: Self-pay | Admitting: Oncology

## 2018-04-03 ENCOUNTER — Encounter: Payer: Self-pay | Admitting: General Surgery

## 2018-04-03 ENCOUNTER — Ambulatory Visit (INDEPENDENT_AMBULATORY_CARE_PROVIDER_SITE_OTHER): Payer: BLUE CROSS/BLUE SHIELD | Admitting: General Surgery

## 2018-04-03 VITALS — BP 120/82 | HR 72 | Resp 14 | Ht 67.0 in | Wt 166.0 lb

## 2018-04-03 DIAGNOSIS — Z170 Estrogen receptor positive status [ER+]: Secondary | ICD-10-CM

## 2018-04-03 DIAGNOSIS — C50811 Malignant neoplasm of overlapping sites of right female breast: Secondary | ICD-10-CM

## 2018-04-03 DIAGNOSIS — Z9011 Acquired absence of right breast and nipple: Secondary | ICD-10-CM | POA: Insufficient documentation

## 2018-04-03 LAB — SURGICAL PATHOLOGY

## 2018-04-03 NOTE — Patient Instructions (Signed)
Return in one month.  

## 2018-04-03 NOTE — Progress Notes (Signed)
Patient ID: Theresa Chaney, female   DOB: [birth year], 37 y.o.   MRN: [id number]  Chief Complaint  Patient presents with  . Follow-up    HPI Theresa Chaney is a 37 y.o. female here today for her follow up right mastectomy and reconstruction done on 03/19/2018 Patient states she is doing well.  HPI  Past Medical History:  Diagnosis Date  . Anemia   . BRCA negative 11/26/2017  . Breast cancer ([place]) 10/11/2017   Multifocal, ER positive, PR negative, HER-2 negative.  . Chronic bronchitis ([place]) 11/2017  . COPD (chronic obstructive pulmonary disease) ([place])    MILD PER CXR  . Depression   . Family history of cancer   . GERD (gastroesophageal reflux disease)   . Headache    MIGRAINES  . Heart murmur    ASYMPTOMATIC  . Personal history of chemotherapy    current for right breast ca    Past Surgical History:  Procedure Laterality Date  . AXILLARY LYMPH NODE DISSECTION Right 03/19/2018   Procedure: AXILLARY LYMPH NODE DISSECTION;  Surgeon: Robert Bellow, MD;  Location: [facility] ORS;  Service: General;  Laterality: Right;  . BREAST BIOPSY Right 10/11/2017   12:30 posterior coil clip invasive mammary carcinoma  . BREAST BIOPSY Right 10/11/2017   11:30 middle depth ribbon clip DCIS  . BREAST BIOPSY Right 10/11/2017   5:30 anterior depth x shape invasive ductal carcinoma  . BREAST RECONSTRUCTION WITH PLACEMENT OF TISSUE EXPANDER AND FLEX HD (ACELLULAR HYDRATED DERMIS) Right 03/19/2018   Procedure: BREAST RECONSTRUCTION WITH PLACEMENT OF TISSUE EXPANDER AND FLEX HD (ACELLULAR HYDRATED DERMIS);  Surgeon: Wallace Going, DO;  Location: [facility] ORS;  Service: Plastics;  Laterality: Right;  . LAPAROSCOPIC BILATERAL SALPINGO OOPHERECTOMY Bilateral 03/19/2018   Procedure: LAPAROSCOPIC BILATERAL SALPINGO OOPHORECTOMY;  Surgeon: Benjaman Kindler, MD;  Location: [facility] ORS;  Service: Gynecology;  Laterality: Bilateral;  . MASTECTOMY W/ SENTINEL NODE BIOPSY Right 03/19/2018   Procedure: MASTECTOMY WITH SENTINEL  LYMPH NODE BIOPSY;  Surgeon: Robert Bellow, MD;  Location: [facility] ORS;  Service: General;  Laterality: Right;  . PORTACATH PLACEMENT Left 10/24/2017   Procedure: INSERTION PORT-A-CATH;  Surgeon: Robert Bellow, MD;  Location: [facility] ORS;  Service: General;  Laterality: Left;    Family History  Problem Relation Age of Onset  . Melanoma Maternal Aunt        other aunts with BCC/SCC/Melanoma  . Diabetes Father   . Hypertension Father   . Hyperlipidemia Father   . Bladder Cancer Maternal Grandmother   . Cervical cancer Maternal Aunt 64       daughter w/ cervical cancer as well  . Melanoma Maternal Uncle        other uncles with BCC/SCC/Melanoma    Social History Social History   Tobacco Use  . Smoking status: Current Every Day Smoker    Packs/day: 0.50    Years: 5.00    Pack years: 2.50    Types: Cigarettes    Last attempt to quit: 12/12/2017    Years since quitting: 0.3  . Smokeless tobacco: Never Used  Substance Use Topics  . Alcohol use: No    Frequency: Never  . Drug use: No    No Known Allergies  Current Outpatient Medications  Medication Sig Dispense Refill  . albuterol (PROVENTIL HFA;VENTOLIN HFA) 108 (90 Base) MCG/ACT inhaler Inhale 2 puffs into the lungs every 6 (six) hours as needed for wheezing or shortness of breath (Cough). 1 Inhaler 2  . ALPRAZolam (XANAX) 0.25 MG  tablet Take 0.25 mg at bedtime as needed by mouth for anxiety or sleep.     . Calcium Carb-Cholecalciferol (CALCIUM 600 + D PO) Take 1 tablet daily by mouth.    . chlorhexidine (PERIDEX) 0.12 % solution USE AS DIRECTED 15MLS IN THE MOUTH OR THROAT TWICE A DAY 473 mL 1  . docusate sodium (COLACE) 100 MG capsule Take 1 capsule (100 mg total) by mouth 2 (two) times daily. (Patient taking differently: Take 100 mg by mouth 2 (two) times daily. While on chemo) 60 capsule 0  . docusate sodium (COLACE) 100 MG capsule Take 1 capsule (100 mg total) by mouth 2 (two) times daily. To keep stools soft 30 capsule  0  . doxylamine, Sleep, (UNISOM) 25 MG tablet Take 25 mg by mouth at bedtime as needed for sleep.    [name] escitalopram (LEXAPRO) 20 MG tablet Take 20 mg by mouth every morning.     [name] esomeprazole (NEXIUM) 40 MG capsule Take 40 mg by mouth every morning.     [name] ibuprofen (ADVIL,MOTRIN) 800 MG tablet Take 800 mg by mouth daily as needed for moderate pain.    [name] ibuprofen (ADVIL,MOTRIN) 800 MG tablet Take 1 tablet (800 mg total) by mouth every 8 (eight) hours as needed for moderate pain. 30 tablet 1  . lidocaine (XYLOCAINE) 2 % solution Use as directed 10 mLs in the mouth or throat every 4 (four) hours as needed for mouth pain. 200 mL 0  . lidocaine-prilocaine (EMLA) cream Apply to affected area once (Patient taking differently: Apply 1 application topically daily as needed (port access). ) 30 g 3  . loratadine (CLARITIN) 10 MG tablet Take 10 mg by mouth daily.     [name] oxycodone (OXY-IR) 5 MG capsule Take 1 capsule (5 mg total) by mouth every 6 (six) hours as needed for pain. 15 capsule 0  . prochlorperazine (COMPAZINE) 10 MG tablet Take 1 tablet (10 mg total) by mouth every 6 (six) hours as needed (Nausea or vomiting). 30 tablet 1  . promethazine (PHENERGAN) 25 MG tablet Take 1 tablet (25 mg total) by mouth every 6 (six) hours as needed for nausea or vomiting. 30 tablet 0  . gabapentin (NEURONTIN) 800 MG tablet Take 1 tablet (800 mg total) by mouth at bedtime for 14 days. Take nightly for 3 days, then up to 14 days as needed 14 tablet 0   No current facility-administered medications for this visit.     Review of Systems Review of Systems  Constitutional: Negative.   Respiratory: Negative.   Cardiovascular: Negative.     Blood pressure 120/82, pulse 72, resp. rate 14, height '5\' 7"'$  (1.702 m), weight 166 lb (75.3 kg).  Physical Exam Physical Exam  Pulmonary/Chest:      Data Reviewed B. SENTINEL LYMPH NODE #2,3,4; EXCISION:  - ONE OF THREE LYMPH NODES POSITIVE FOR METASTASIS (1/3).   C.  RIGHT BREAST; MASTECTOMY:  - INVASIVE MAMMARY CARCINOMA, 8.7 CM.  - LYMPHOVASCULAR INVASION.  - THE SURGICAL MARGINS ARE CLOSE BUT NEGATIVE, CLOSEST MARGIN IS DEEP.  - BIOPSY SITE CHANGES, MARKER CLIPS PRESENT.   D. AXILLARY CONTENTS; DISSECTION:  - TWO OF TWELVE LYMPH NODES POSITIVE FOR MACROMETASTASIS (2/12).  - ONE OF TWELVE LYMPH NODES POSITIVE FOR MICROMETASTASIS (1/12).  ypT3 ypN2a   E. LEFT FALLOPIAN TUBE AND OVARY; SALPINGO-OOPHORECTOMY:  - NO PATHOLOGIC CHANGE.   F. RIGHT FALLOPIAN TUBE AND OVARY; SALPINGO-OOPHORECTOMY:  - NO PATHOLOGIC CHANGE.    Assessment    Doing  well post mastectomy with immediate reconstruction.    Plan  The case was reviewed at the [facility] tumor board last night.    Plans are to complete Taxol therapy followed by whole breast radiation.  Patient will follow-up later today with plastic surgery as scheduled.   Return in ine month The patient is aware to call back for any questions or concerns.  HPI, Physical Exam, Assessment and Plan have been scribed under the direction and in the presence of Hervey Ard, MD.  Gaspar Cola, CMA  I have completed the exam and reviewed the above documentation for accuracy and completeness.  I agree with the above.  Haematologist has been used and any errors in dictation or transcription are unintentional.  Hervey Ard, M.D., F.A.C.S. [place] Andreas Sobolewski 04/03/2018, 9:33 PM

## 2018-04-04 ENCOUNTER — Inpatient Hospital Stay: Payer: BLUE CROSS/BLUE SHIELD | Attending: Oncology | Admitting: Oncology

## 2018-04-04 ENCOUNTER — Encounter: Payer: Self-pay | Admitting: Oncology

## 2018-04-04 ENCOUNTER — Other Ambulatory Visit: Payer: Self-pay

## 2018-04-04 VITALS — BP 111/76 | HR 115 | Temp 97.5°F | Resp 12 | Ht 67.0 in | Wt 165.0 lb

## 2018-04-04 DIAGNOSIS — Z90722 Acquired absence of ovaries, bilateral: Secondary | ICD-10-CM

## 2018-04-04 DIAGNOSIS — Z9011 Acquired absence of right breast and nipple: Secondary | ICD-10-CM | POA: Insufficient documentation

## 2018-04-04 DIAGNOSIS — Z170 Estrogen receptor positive status [ER+]: Secondary | ICD-10-CM | POA: Insufficient documentation

## 2018-04-04 DIAGNOSIS — F17210 Nicotine dependence, cigarettes, uncomplicated: Secondary | ICD-10-CM | POA: Diagnosis not present

## 2018-04-04 DIAGNOSIS — C50811 Malignant neoplasm of overlapping sites of right female breast: Secondary | ICD-10-CM | POA: Diagnosis not present

## 2018-04-04 DIAGNOSIS — Z5111 Encounter for antineoplastic chemotherapy: Secondary | ICD-10-CM | POA: Insufficient documentation

## 2018-04-04 DIAGNOSIS — Z808 Family history of malignant neoplasm of other organs or systems: Secondary | ICD-10-CM | POA: Insufficient documentation

## 2018-04-04 NOTE — Progress Notes (Signed)
Patient here for follow up. SP surgery.

## 2018-04-04 NOTE — Progress Notes (Signed)
Hematology/Oncology Follow Up Note [facility] Telephone:([phone] [phone]  Patient Care Team: Juluis Pitch, MD as PCP - General (Family Medicine)   Name of the patient: Theresa Chaney  [id number]  [birth year]   Date of visit: 04/04/18 REASON FOR COSULTATION:  Treatment of  breast cancer  History of presenting illness 37 year old female with past medical history listed as below presented for evaluation of newly diagnosed right breast cancer. Patient self palpated a right breast mass 2 months ago during bathing. This triggered workup including mammogram and ultrasound. 10/05/2017 mammogram diagnostic breast tomo bilaterally showed multicentric area had a suspicion of breast cancer. 1  highly suspicious irregular mass with architectural distortion in the upper right breast at posterior depth measuring approximately 2 cm greatest dimension with associated architectural distortion and pleomorphic micro-calcifications. On ultrasound of vague hypoechoic masslike areas seen in the right breast at 11:30 o'clock axis. 2 second areas irregular hypoechoic mass in the right breast at the 11:30 o'clock axis, 6 cm from the nipple, at the superficial depth measuring 1.2 cm, likely correlate for a highly suspicious 1.4 cm extent of pleomorphic and a fine linear branching calcifications. 3 additional highly suspicious pleomorphic calcifications within the upper retroareolar right breast extending to this subareolar right breast, measuring 3.7 cm extending.  Right axilla was evaluated with ultrasound showing no enlarged or morphologically abnormal lymph nodes.  Patient underwent stereotactic biopsy, on 10/11/2017. Pathology showed A, carcinoma in situ intermediate to high grade with necrosis and calcifications at the right breast 11:30. B, right breast 5:30 subareolar, invasive carcinoma with diffusely infiltrative pattern in the lymphovascular invasion. In a background  of intermediate grade carcinoma in situ with necrosis and calcifications. C right breast 12:30, posterior common invasive mammary carcinoma of her type, associated with high-grade DCIS with necrosis and calcifications. Right breast 5:30 subareolar, ER +75%, PR negative HER-2 negative. Right breast 12:30 posterior, and ER +90%, and PR negative, and HER-2 negative.  Her aunt has a history of melanoma. . She is married, and she has a Psychiatrist and a biological daughter who is 30 years old. . She reports that she has a history of heart murmur which was diagnosed by primary care physician. She's never had any other health problems.   #Genetic testing  INTERVAL HISTORY Patient presents for follow up of management of breast cancer. She underwent right mastectomy and right axillary dissection. Pathology showed invasive mammary carcinoma, 8.7 cm, grade 3,  with lymphovascular invasion, surgical margins are close but negative.  1 out of 3 sentinel lymph nodes positive, right axillary dissection showed 2 out of the 12 lymph nodes positive for macro metastasis, 1 out of 12 lymph nodes positive for micrometastasis.  Pathological staging is ypT3 ypN2  She had right immediate breast reconstruction with placement of tissue expanders.Patient also chose to have bilateral salpingo-oophorectomy.    Patient presents clinic to discuss about adjuvant chemotherapy plan for breast cancer.  Minimal pain of her surgical sites.  Denies any fever, chills.  Denies any hot flushes.  Cancer TREATMENT Neoadjuvant ddAC +1 week of Taxol, due to lack of response, surgery was offered. Case was discussed on breast tumor board. 03/19/2018 S/p right mastectomy and right axillary dissection, immediate breast reconstruction with placement of expanders. Also had elective bilateral salpingo-oophorectomy..    Review of Systems  Constitutional: Negative for diaphoresis, fever, malaise/fatigue and weight loss.  HENT: Negative for ear  pain, hearing loss, nosebleeds and tinnitus.   Eyes: Negative for blurred vision, double vision,  photophobia and discharge.  Respiratory: Negative for cough, hemoptysis and sputum production.   Cardiovascular: Negative for chest pain, palpitations, orthopnea and leg swelling.  Gastrointestinal: Negative for abdominal pain, diarrhea, heartburn, nausea and vomiting.  Genitourinary: Negative for dysuria, frequency, hematuria and urgency.  Musculoskeletal: Negative for back pain, myalgias and neck pain.  Skin: Negative for itching and rash.  Neurological: Negative for dizziness, tremors, speech change, focal weakness and headaches.  Endo/Heme/Allergies: Negative for environmental allergies and polydipsia. Does not bruise/bleed easily.  Psychiatric/Behavioral: Negative for depression, hallucinations and substance abuse. The patient is not nervous/anxious.     No Known Allergies  Patient Active Problem List   Diagnosis Date Noted  . Breast cancer of upper-outer quadrant of right female breast ([place]) 03/19/2018  . Family history of cancer   . Malignant neoplasm of overlapping sites of right breast in female, estrogen receptor positive ([place]) 10/19/2017     Past Medical History:  Diagnosis Date  . Anemia   . BRCA negative 11/26/2017  . Breast cancer ([place]) 10/11/2017   Multifocal, ER positive, PR negative, HER-2 negative.  . Chronic bronchitis ([place]) 11/2017  . COPD (chronic obstructive pulmonary disease) ([place])    MILD PER CXR  . Depression   . Family history of cancer   . GERD (gastroesophageal reflux disease)   . Headache    MIGRAINES  . Heart murmur    ASYMPTOMATIC  . Personal history of chemotherapy    current for right breast ca     Past Surgical History:  Procedure Laterality Date  . AXILLARY LYMPH NODE DISSECTION Right 03/19/2018   Procedure: AXILLARY LYMPH NODE DISSECTION;  Surgeon: Robert Bellow, MD;  Location: [facility] ORS;  Service: General;  Laterality: Right;  . BREAST  BIOPSY Right 10/11/2017   12:30 posterior coil clip invasive mammary carcinoma  . BREAST BIOPSY Right 10/11/2017   11:30 middle depth ribbon clip DCIS  . BREAST BIOPSY Right 10/11/2017   5:30 anterior depth x shape invasive ductal carcinoma  . BREAST RECONSTRUCTION WITH PLACEMENT OF TISSUE EXPANDER AND FLEX HD (ACELLULAR HYDRATED DERMIS) Right 03/19/2018   Procedure: BREAST RECONSTRUCTION WITH PLACEMENT OF TISSUE EXPANDER AND FLEX HD (ACELLULAR HYDRATED DERMIS);  Surgeon: Wallace Going, DO;  Location: [facility] ORS;  Service: Plastics;  Laterality: Right;  . LAPAROSCOPIC BILATERAL SALPINGO OOPHERECTOMY Bilateral 03/19/2018   Procedure: LAPAROSCOPIC BILATERAL SALPINGO OOPHORECTOMY;  Surgeon: Benjaman Kindler, MD;  Location: [facility] ORS;  Service: Gynecology;  Laterality: Bilateral;  . MASTECTOMY W/ SENTINEL NODE BIOPSY Right 03/19/2018   Procedure: MASTECTOMY WITH SENTINEL LYMPH NODE BIOPSY;  Surgeon: Robert Bellow, MD;  Location: [facility] ORS;  Service: General;  Laterality: Right;  . PORTACATH PLACEMENT Left 10/24/2017   Procedure: INSERTION PORT-A-CATH;  Surgeon: Robert Bellow, MD;  Location: [facility] ORS;  Service: General;  Laterality: Left;    Social History   Socioeconomic History  . Marital status: Married    Spouse name: Not on file  . Number of children: Not on file  . Years of education: Not on file  . Highest education level: Not on file  Occupational History  . Occupation: Occupational psychologist    Comment: [employer]  . Financial resource strain: Not on file  . Food insecurity:    Worry: Not on file    Inability: Not on file  . Transportation needs:    Medical: Not on file    Non-medical: Not on file  Tobacco Use  . Smoking status:  Current Every Day Smoker    Packs/day: 0.50    Years: 5.00    Pack years: 2.50    Types: Cigarettes    Last attempt to quit: 12/12/2017    Years since quitting: 0.3  . Smokeless tobacco: Never Used    Substance and Sexual Activity  . Alcohol use: No    Frequency: Never  . Drug use: No  . Sexual activity: Yes    Birth control/protection: Injection  Lifestyle  . Physical activity:    Days per week: Not on file    Minutes per session: Not on file  . Stress: Not on file  Relationships  . Social connections:    Talks on phone: Not on file    Gets together: Not on file    Attends religious service: Not on file    Active member of club or organization: Not on file    Attends meetings of clubs or organizations: Not on file    Relationship status: Not on file  . Intimate partner violence:    Fear of current or ex partner: Not on file    Emotionally abused: Not on file    Physically abused: Not on file    Forced sexual activity: Not on file  Other Topics Concern  . Not on file  Social History Narrative  . Not on file     Family History  Problem Relation Age of Onset  . Melanoma Maternal Aunt        other aunts with BCC/SCC/Melanoma  . Diabetes Father   . Hypertension Father   . Hyperlipidemia Father   . Bladder Cancer Maternal Grandmother   . Cervical cancer Maternal Aunt 64       daughter w/ cervical cancer as well  . Melanoma Maternal Uncle        other uncles with BCC/SCC/Melanoma     Current Outpatient Medications:  .  albuterol (PROVENTIL HFA;VENTOLIN HFA) 108 (90 Base) MCG/ACT inhaler, Inhale 2 puffs into the lungs every 6 (six) hours as needed for wheezing or shortness of breath (Cough)., Disp: 1 Inhaler, Rfl: 2 .  ALPRAZolam (XANAX) 0.25 MG tablet, Take 0.25 mg at bedtime as needed by mouth for anxiety or sleep. , Disp: , Rfl:  .  Calcium Carb-Cholecalciferol (CALCIUM 600 + D PO), Take 1 tablet daily by mouth., Disp: , Rfl:  .  chlorhexidine (PERIDEX) 0.12 % solution, USE AS DIRECTED 15MLS IN THE MOUTH OR THROAT TWICE A DAY, Disp: 473 mL, Rfl: 1 .  docusate sodium (COLACE) 100 MG capsule, Take 1 capsule (100 mg total) by mouth 2 (two) times daily. (Patient taking  differently: Take 100 mg by mouth 2 (two) times daily. While on chemo), Disp: 60 capsule, Rfl: 0 .  docusate sodium (COLACE) 100 MG capsule, Take 1 capsule (100 mg total) by mouth 2 (two) times daily. To keep stools soft, Disp: 30 capsule, Rfl: 0 .  doxylamine, Sleep, (UNISOM) 25 MG tablet, Take 25 mg by mouth at bedtime as needed for sleep., Disp: , Rfl:  .  escitalopram (LEXAPRO) 20 MG tablet, Take 20 mg by mouth every morning. , Disp: , Rfl:  .  esomeprazole (NEXIUM) 40 MG capsule, Take 40 mg by mouth every morning. , Disp: , Rfl:  .  ibuprofen (ADVIL,MOTRIN) 800 MG tablet, Take 800 mg by mouth daily as needed for moderate pain., Disp: , Rfl:  .  ibuprofen (ADVIL,MOTRIN) 800 MG tablet, Take 1 tablet (800 mg total) by mouth every 8 (eight)  hours as needed for moderate pain., Disp: 30 tablet, Rfl: 1 .  lidocaine (XYLOCAINE) 2 % solution, Use as directed 10 mLs in the mouth or throat every 4 (four) hours as needed for mouth pain., Disp: 200 mL, Rfl: 0 .  lidocaine-prilocaine (EMLA) cream, Apply to affected area once (Patient taking differently: Apply 1 application topically daily as needed (port access). ), Disp: 30 g, Rfl: 3 .  loratadine (CLARITIN) 10 MG tablet, Take 10 mg by mouth daily. , Disp: , Rfl:  .  oxycodone (OXY-IR) 5 MG capsule, Take 1 capsule (5 mg total) by mouth every 6 (six) hours as needed for pain., Disp: 15 capsule, Rfl: 0 .  prochlorperazine (COMPAZINE) 10 MG tablet, Take 1 tablet (10 mg total) by mouth every 6 (six) hours as needed (Nausea or vomiting)., Disp: 30 tablet, Rfl: 1 .  promethazine (PHENERGAN) 25 MG tablet, Take 1 tablet (25 mg total) by mouth every 6 (six) hours as needed for nausea or vomiting., Disp: 30 tablet, Rfl: 0   Physical exam:  Vitals:   04/04/18 1432 04/04/18 1437  BP:  111/76  Pulse:  (!) 115  Resp: 12   Temp:  (!) 97.5 F (36.4 C)  Weight: 165 lb (74.8 kg)   Height: 5' 7"  (1.702 m)   ECOG 0 Physical Exam  Constitutional: She is oriented to  person, place, and time and well-developed, well-nourished, and in no distress. No distress.  HENT:  Head: Normocephalic and atraumatic.  Mouth/Throat: Oropharynx is clear and moist. No oropharyngeal exudate.  No thrush,  Eyes: Conjunctivae and EOM are normal. Left eye exhibits no discharge. No scleral icterus.  Neck: Normal range of motion. Neck supple.  Cardiovascular: Normal rate, regular rhythm and normal heart sounds.  No murmur heard. Pulmonary/Chest: Effort normal and breath sounds normal. No respiratory distress.  Abdominal: Soft. Bowel sounds are normal. She exhibits no distension. There is no tenderness.  Musculoskeletal: Normal range of motion. She exhibits no tenderness.  Lymphadenopathy:    She has no cervical adenopathy.  Neurological: She is alert and oriented to person, place, and time. No cranial nerve deficit.  Skin: Skin is warm and dry. No erythema.  Psychiatric: Mood, affect and judgment normal.  Breast exam is performed in seated and lying down position. Patient is status post right mastectomy with tissue expander.   CMP Latest Ref Rng & Units 03/19/2018  Glucose 65 - 99 mg/dL 97  BUN 6 - 20 mg/dL -  Creatinine 0.44 - 1.00 mg/dL -  Sodium 135 - 145 mmol/L 142  Potassium 3.5 - 5.1 mmol/L 3.5  Chloride 101 - 111 mmol/L -  CO2 22 - 32 mmol/L -  Calcium 8.9 - 10.3 mg/dL -  Total Protein 6.5 - 8.1 g/dL -  Total Bilirubin 0.3 - 1.2 mg/dL -  Alkaline Phos 38 - 126 U/L -  AST 15 - 41 U/L -  ALT 14 - 54 U/L -   CBC Latest Ref Rng & Units 03/19/2018  WBC 3.6 - 11.0 K/uL -  Hemoglobin 12.0 - 15.0 g/dL 12.9  Hematocrit 36.0 - 46.0 % 38.0  Platelets 150 - 440 K/uL -    Assessment and plan- Patient is a 37 y.o. female presents for evaluation of newly diagnosed multicentric right breast cancer.  Cancer Staging Breast cancer of upper-outer quadrant of right female breast [facility]) Staging form: Breast, AJCC 8th Edition - Clinical: G2, ER+, PR+, HER2- - Signed by Earlie Server,  MD on 04/05/2018 - Pathologic stage  from 04/04/2018: No Stage Recommended (ypT3, pN2, cM0, G3, ER+, PR-, HER2-) - Signed by Earlie Server, MD on 04/04/2018     1. Malignant neoplasm of overlapping sites of right breast in female, estrogen receptor positive ([place])   2. Estrogen receptor positive status (ER+)    # She has Stage IIIA disease, pathology results and disease extent were discussed with patient. I recommend to obtain CT chest abdomen pelvis w contrast prior to starting adjuvant chemotherapy. CT was not done prior to neoadjuvant treatment as patient declined due to lack of good coverage.   If clinically M0 disease, plan proceed with weekly Taxol x 11 weeks, followed by adjuvant Radiation. She prefers not to be referred to RadOnc until after she has CT done.   Unfortunately she has advanced stage and high grade disease. Strongly ER/PR positive, HER2 negative.  # After she finishes chemotherapy and Radiation, she maybe eligible for NATALEE trial in which she has 50% chance to be randomized to adjuvant Ribociclib with endocrine therapy.   # bilateral oophorectomy, plan Aromatase inhibitor along with NATALEE trial.  # Advise patient to obtain dental clearance for prophylactic bisphosphonate use.   All questions answered. Plan start Ajuvant Taxol after CT.   Total face to face encounter time for this patient visit was 50 min. >50% of the time was  spent in counseling and coordination of care.   Earlie Server, MD, PhD Hematology Oncology [facility] at [facility] Pager- [phone] 04/04/18

## 2018-04-05 ENCOUNTER — Telehealth: Payer: Self-pay | Admitting: *Deleted

## 2018-04-05 NOTE — Telephone Encounter (Addendum)
Per 04/04/18 los. To schedule,  Bone scan (before Monday)  CT Chest abdomin/pelvis ASAP (before Monday)  Lab/MD/Taxol on Monday.    When Orders were 1st put in, Both orders/Same appts.were put in on 2 different patient's of Dr.Yu. While trying to get this patient scheduled. Per Irine Seal from [place] Scheduling made me aware that both Bone Scan and CT Chest/Abd/Pelvis were placed in as Routine and Not STAT. And to call back when orders were changed. Almyra Free was made aware of this, as the patient sat patiently waiting.  Almyra Free was called twice before they were finally changed. I also made her aware that there were absolutely  (No Space on Monday in Infusion for a 3 1/2 hr.slot due to very long infusions) and asked her, if patient could come on Tuesday instead for Lab/MD/Taxol. So, today I came in checked infusion schedule again for Monday and nothing has changed. However, I was able to fine space on Tuesday 05/10/18 to accommodate this patient. If Tuesday is ok, I will get her placed on the schedule for Lab/MD/Taxol on Tuesday 04/10/18. Patient called today asking about her appts.and I assured her that I would give her a call today with her appt.Date and Time.

## 2018-04-06 ENCOUNTER — Encounter
Admission: RE | Admit: 2018-04-06 | Discharge: 2018-04-06 | Disposition: A | Discharge status: Home / Self Care | Payer: BLUE CROSS/BLUE SHIELD | Source: Ambulatory Visit | Attending: Oncology | Admitting: Oncology

## 2018-04-06 ENCOUNTER — Ambulatory Visit
Admission: RE | Admit: 2018-04-06 | Discharge: 2018-04-06 | Disposition: A | Discharge status: Home / Self Care | Payer: BLUE CROSS/BLUE SHIELD | Source: Ambulatory Visit | Attending: Oncology | Admitting: Oncology

## 2018-04-06 DIAGNOSIS — Z170 Estrogen receptor positive status [ER+]: Secondary | ICD-10-CM | POA: Diagnosis present

## 2018-04-06 DIAGNOSIS — Z9011 Acquired absence of right breast and nipple: Secondary | ICD-10-CM | POA: Diagnosis not present

## 2018-04-06 DIAGNOSIS — R918 Other nonspecific abnormal finding of lung field: Secondary | ICD-10-CM | POA: Insufficient documentation

## 2018-04-06 DIAGNOSIS — C50811 Malignant neoplasm of overlapping sites of right female breast: Secondary | ICD-10-CM | POA: Diagnosis present

## 2018-04-06 IMAGING — CT CT CHEST W/ CM
3 of 5 series · 14 of 36 positions shown, 16 images · IV contrast (iopamidol)
Comparison: Chest radiograph [DATE]

CLINICAL DATA: Patient with history of breast cancer status post
right mastectomy and axillary lymph dissection.

EXAM:
CT CHEST, ABDOMEN, AND PELVIS WITH CONTRAST
TECHNIQUE: Multidetector CT imaging of the chest, abdomen and pelvis was
performed following the standard protocol during bolus
administration of intravenous contrast.
CONTRAST:  75mL [IF] IOPAMIDOL ([IF]) INJECTION 76%

[Series 2: cap with · axial · 0.68mm/px · z∈[-562,-72]mm · 8 of 126 slices shown, 10 images]
[im 14/126  mediastinal]
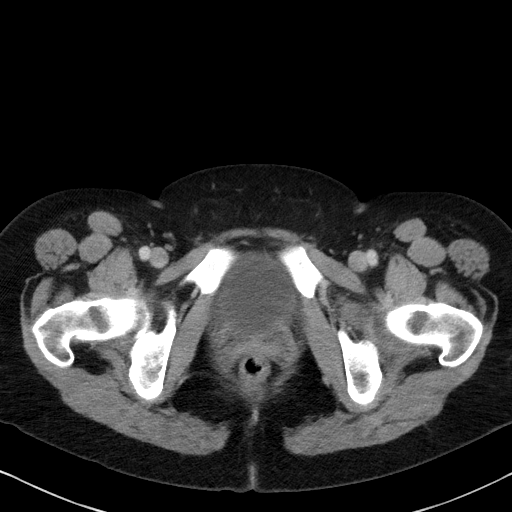
[im 14/126  lung]
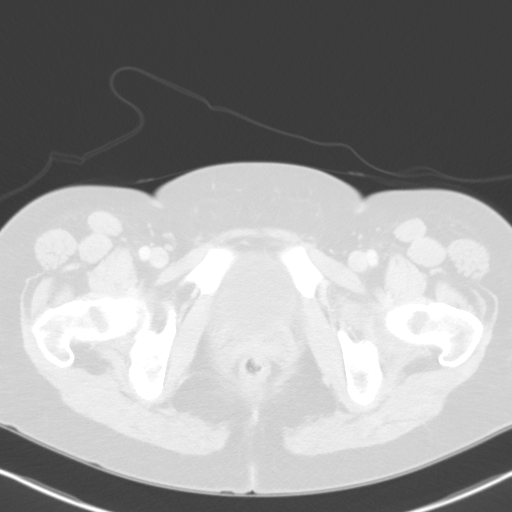
[im 28/126  lung]
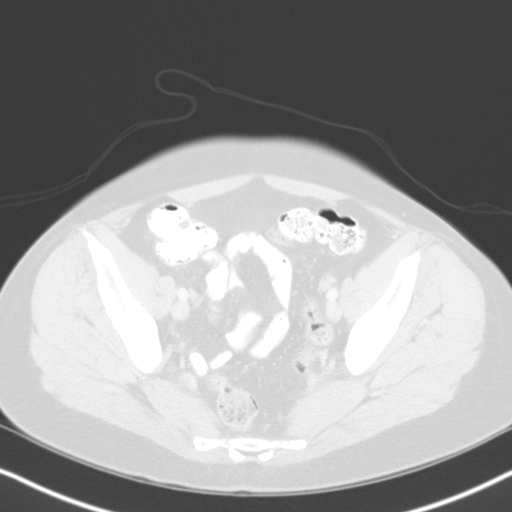
[im 42/126  lung]
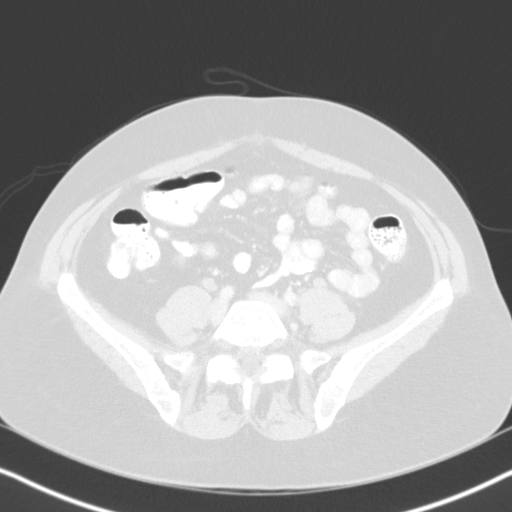
[im 56/126  lung]
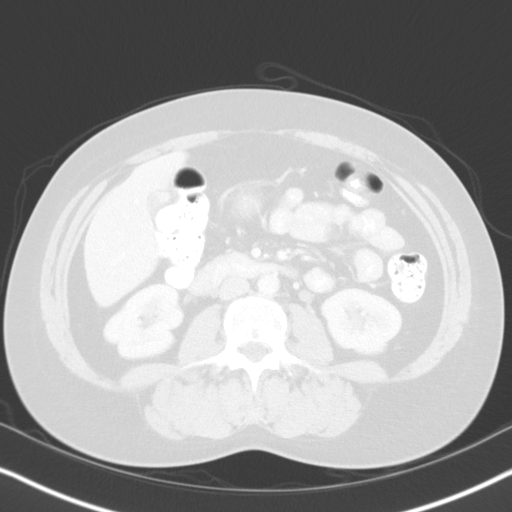
[im 70/126  mediastinal]
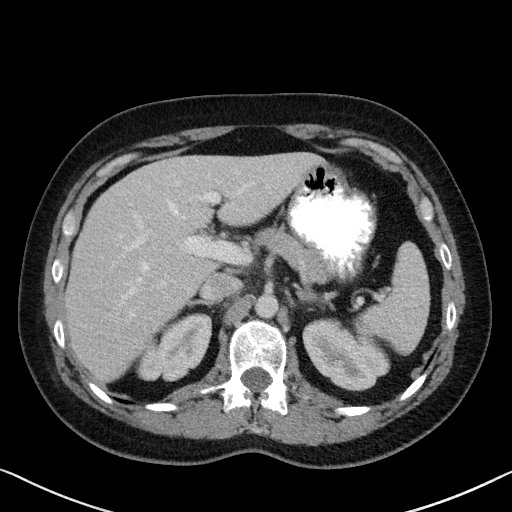
[im 70/126  lung]
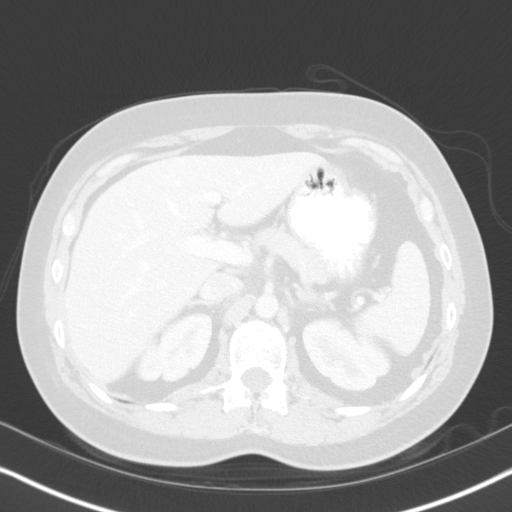
[im 84/126  lung]
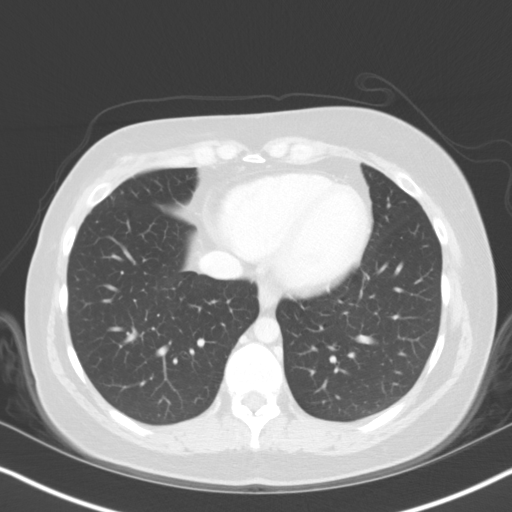
[im 98/126  lung]
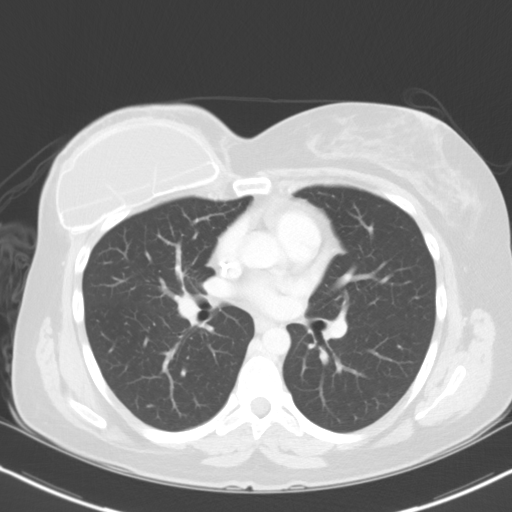
[im 112/126  lung]
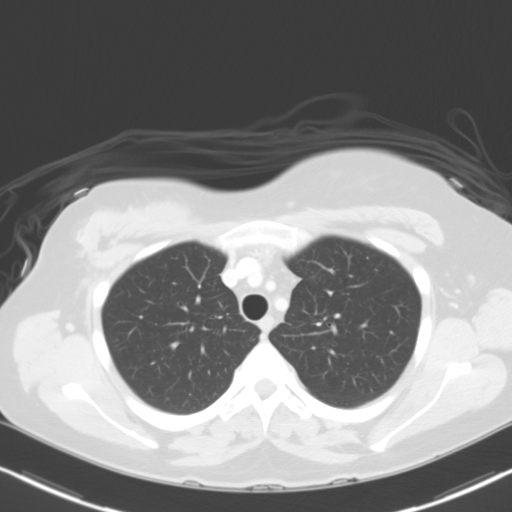

[Series 4: lung · axial · 0.68mm/px · z∈[-276,-194]mm · 3 of 151 slices shown]
[im 14/151  lung]
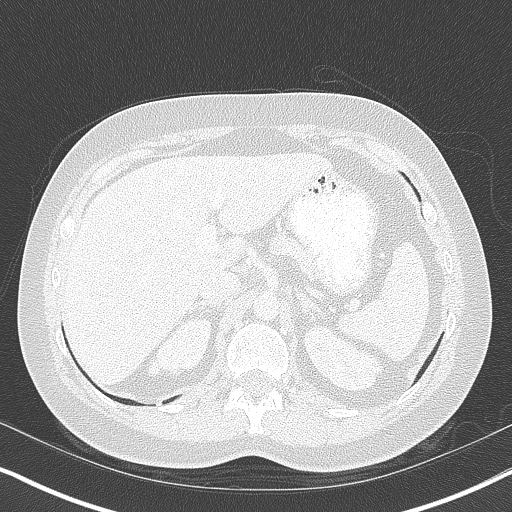
[im 28/151  lung]
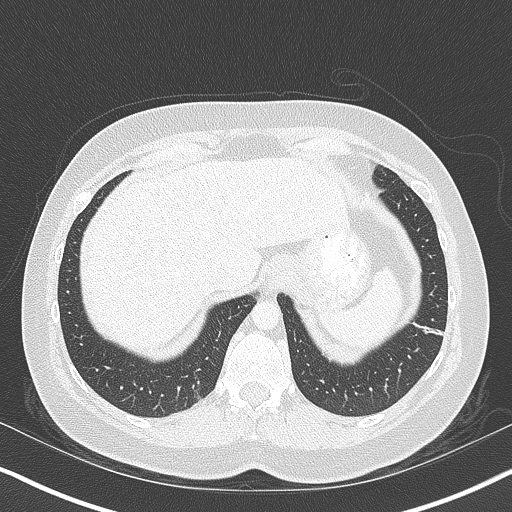
[im 55/151  lung]
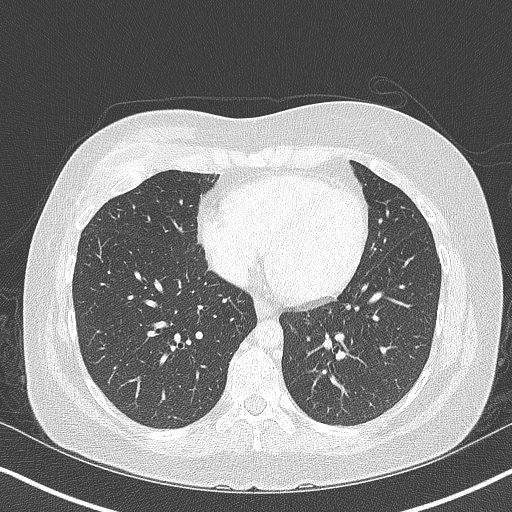

[Series 5: coronals · coronal · 0.66mm/px · 3 of 117 slices shown]
[im 24/117  lung]
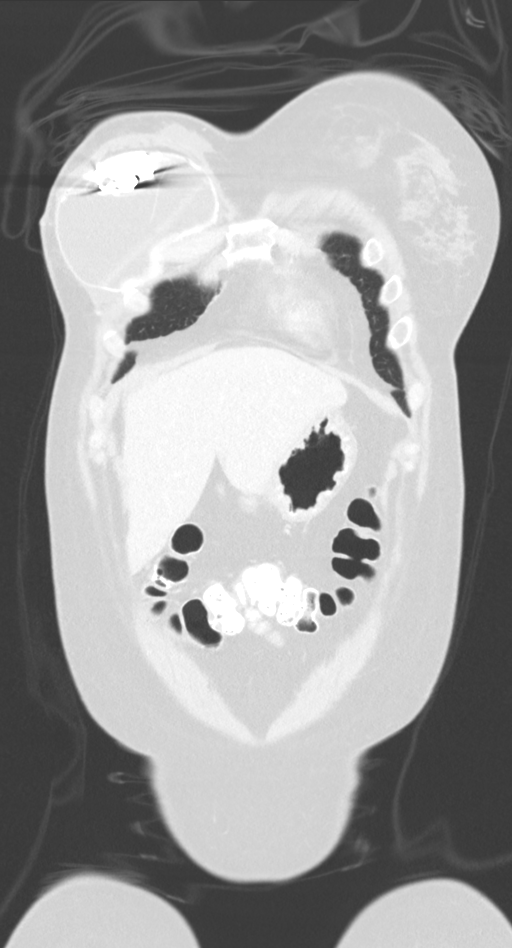
[im 47/117  lung]
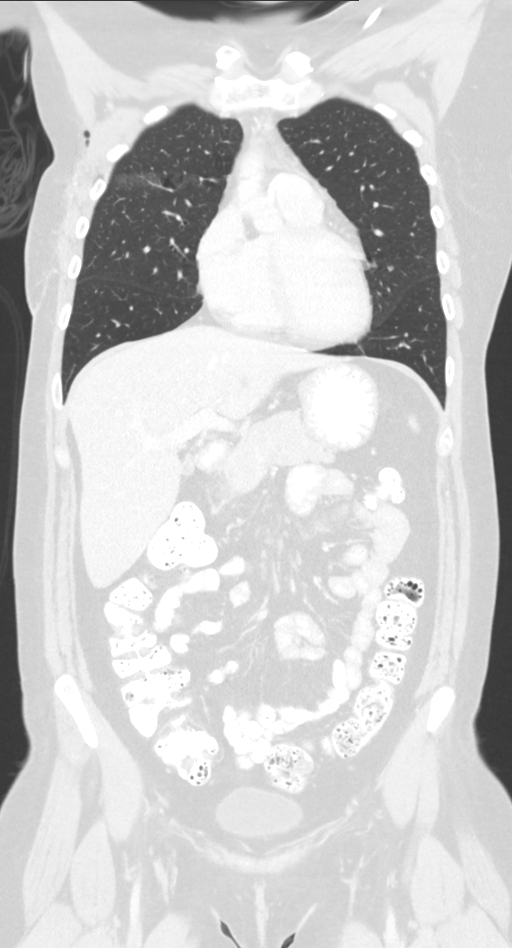
[im 70/117  lung]
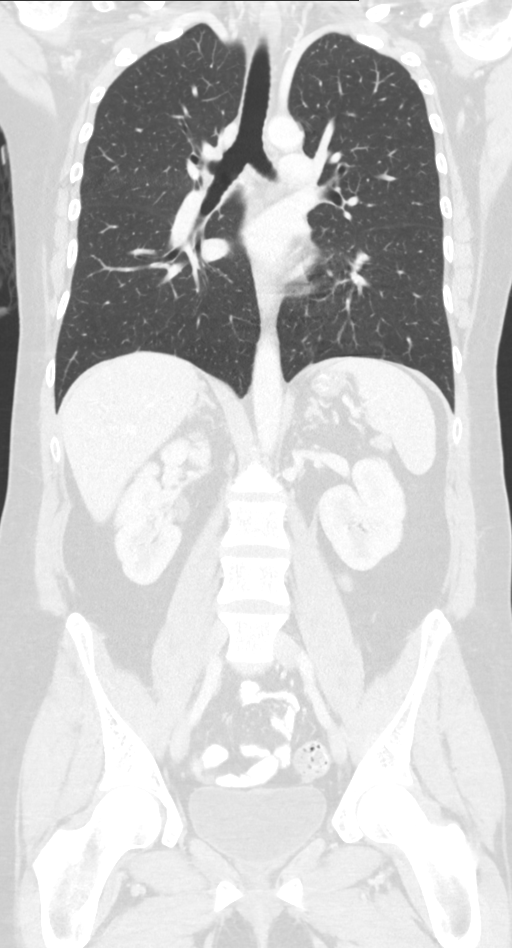

[14 of 36 positions shown; findings below may reference images not displayed]

FINDINGS: CT CHEST FINDINGS

Cardiovascular: Left anterior chest wall Port-A-Cath is present with
tip terminating in the superior vena cava. Normal heart size. Trace
fluid superior pericardial recess.

Mediastinum/Nodes: Postsurgical changes right axilla. No left
axillary adenopathy. No mediastinal or hilar lymphadenopathy. Normal
esophagus.

Lungs/Pleura: Central airways are patent. Dependent atelectasis left
lower lobe. 3 mm left lower lobe pulmonary nodule (image 113; series
4). 5 mm right upper lobe nodule (image 53; series 4). No pleural
effusion or pneumothorax.

Musculoskeletal: No aggressive or acute appearing osseous lesions.
Patient status post right mastectomy with tissue expander in place.

CT ABDOMEN PELVIS FINDINGS

Hepatobiliary: Liver is normal in size and contour. No focal hepatic
lesions identified. Fatty deposition adjacent to the falciform
ligament. Gallbladder is unremarkable. No intrahepatic or
extrahepatic biliary ductal dilatation.

Pancreas: Unremarkable

Spleen: Unremarkable

Adrenals/Urinary Tract: Adrenal glands are normal. Kidneys are
lobular in contour. No hydronephrosis. Urinary bladder is
unremarkable.

Stomach/Bowel: Normal morphology of the stomach. No abnormal bowel
wall thickening or evidence for bowel obstruction. No free fluid or
free intraperitoneal air. Normal appendix.

Vascular/Lymphatic: Normal caliber abdominal aorta. No
retroperitoneal lymphadenopathy.

Reproductive: Uterus and adnexal structures are unremarkable.

Other: None.

Musculoskeletal: Lumbar spine degenerative changes. No aggressive or
acute appearing osseous lesions.
IMPRESSION: 1. Patient status post right mastectomy with tissue expander in
place. Postsurgical changes right axilla.
2. No definite evidence for metastatic disease within the chest,
abdomen or pelvis.
3. Small pulmonary nodules measuring up to 5 mm within the right
upper lobe. Recommend attention on follow-up.

## 2018-04-06 IMAGING — NM NM BONE WHOLE BODY
1 series · 2 of 2 positions shown · non-contrast
Comparison: Concurrently obtained CT scans the chest, abdomen and
pelvis

CLINICAL DATA: 37-year-old female with a history of breast cancer.
Evaluate for osseous metastatic disease.

EXAM:
NUCLEAR MEDICINE WHOLE BODY BONE SCAN
TECHNIQUE: Whole body anterior and posterior images were obtained approximately
3 hours after intravenous injection of radiopharmaceutical.
RADIOPHARMACEUTICALS:  23.349 mCi [WK] MDP IV

[Series 1000: 3 hr wholebody · 2.40mm/px · 2 of 2 frames shown]
[frame 1/2]
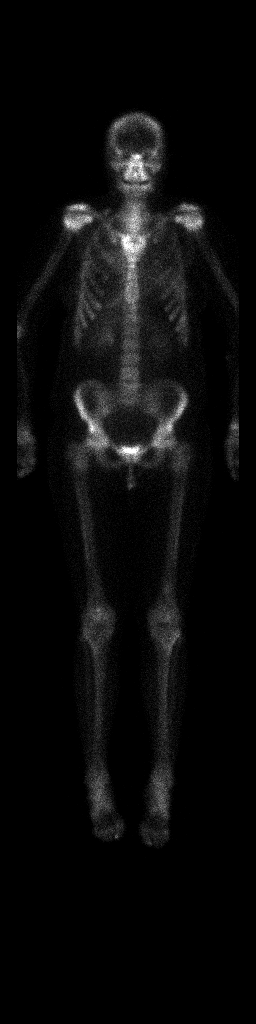
[frame 2/2]
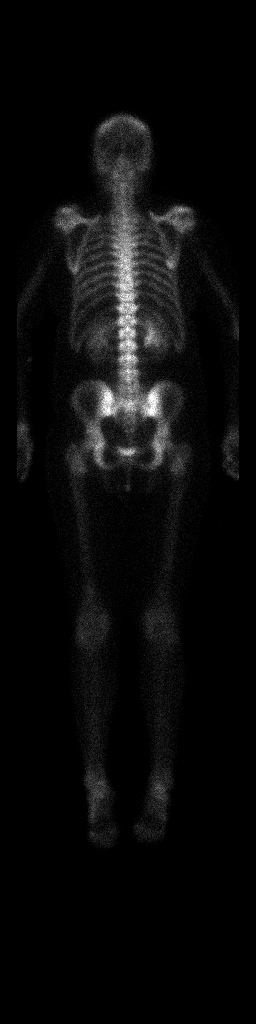

[2 of 2 positions shown; findings below may reference images not displayed]

FINDINGS: Normal radiotracer uptake distribution throughout the axial skeleton
and urinary collecting system. Degenerative range uptake present at
the pubic symphysis, and in the bilateral acromioclavicular and
glenohumeral joints. No evidence of osseous metastatic disease.
IMPRESSION: Normal exam.  No evidence of osseous metastatic disease.

## 2018-04-06 MED ORDER — IOPAMIDOL (ISOVUE-370) INJECTION 76%
75.0000 mL | Freq: Once | INTRAVENOUS | Status: AC | PRN
  Administered 2018-04-06: 75 mL via INTRAVENOUS

## 2018-04-06 MED ORDER — TECHNETIUM TC 99M MEDRONATE IV KIT
23.3490 | PACK | Freq: Once | INTRAVENOUS | Status: AC | PRN
  Administered 2018-04-06: 23.349 via INTRAVENOUS

## 2018-04-06 NOTE — Telephone Encounter (Signed)
at I would give her a call today with her appt.Date and Time.  Per 04/04/18 los. To schedule,  Bone scan (before Monday)  CT Chest abdomin/pelvis ASAP (before Monday)  Lab/MD/Taxol on Monday.    When Orders were 1st put in, Both orders/Same appts.were put in on 2 different patient's of Dr.Yu. While trying to get this patient scheduled. Per Irine Seal from [place] Scheduling made me aware that both Bone Scan and CT Chest/Abd/Pelvis were placed in as Routine and Not STAT. And to call back when orders were changed. Almyra Free was made aware of this, as the patient sat patiently waiting.  Almyra Free was called twice before they were finally changed. I also made her aware that there were absolutely  (No Space on Monday in Infusion for a 3 1/2 hr.slot due to very long infusions) and asked her, if patient could come on Tuesday instead for Lab/MD/Taxol. So, today I came in checked infusion schedule again for Monday and nothing has changed. However, I was able to fine space on Tuesday 05/10/18 to accommodate this patient. If Tuesday is ok, I will get her placed on the schedule for Lab/MD/Taxol on Tuesday 04/10/18. Patient called today asking about her appts.and I assured her th

## 2018-04-06 NOTE — Telephone Encounter (Signed)
   Per 04/04/18 los. To schedule,  Bone scan (before Monday)  CT Chest abdomin/pelvis ASAP (before Monday)  Lab/MD/Taxol on Monday.    When Orders were 1st put in, Both orders/Same appts.were put in on 2 different patient's of Dr.Yu. While trying to get this patient scheduled. Per Irine Seal from [place] Scheduling made me aware that both Bone Scan and CT Chest/Abd/Pelvis were placed in as Routine and Not STAT. And to call back when orders were changed. Almyra Free was made aware of this, as the patient sat patiently waiting.  Almyra Free was called twice before they were finally changed. I also made her aware that there were absolutely  (No Space on Monday in Infusion for a 3 1/2 hr.slot due to very long infusions) and asked her, if patient could come on Tuesday instead for Lab/MD/Taxol. So, today I came in checked infusion schedule again for Monday and nothing has changed. However, I was able to fine space on Tuesday 05/10/18 to accommodate this patient. If Tuesday is ok, I will get her placed on the schedule for Lab/MD/Taxol on Tuesday 04/10/18. Patient called today asking about her appts.and I assured her that I would give her a call today with her appt Date and Time.

## 2018-04-06 NOTE — Telephone Encounter (Signed)
at I would give her a call today with her appt.Date and Time.  Per 04/04/18 los. To schedule,  Bone scan (before Monday)  CT Chest abdomin/pelvis ASAP (before Monday)  Lab/MD/Taxol on Monday.    When Orders were 1st put in, Both orders/Same appts.were put in on 2 different patient's of Dr.Yu. While trying to get this patient scheduled. Per Irine Seal from [place] Scheduling made me aware that both Bone Scan and CT Chest/Abd/Pelvis were placed in as Routine and Not STAT. And to call back when orders were changed. Almyra Free was made aware of this, as the patient sat patiently waiting.  Almyra Free was called twice before they were finally changed. I also made her aware that there were absolutely  (No Space on Monday in Infusion for a 3 1/2 hr.slot due to very long infusions) and asked her, if patient could come on Tuesday instead for Lab/MD/Taxol. So, today I came in checked infusion schedule again for Monday and nothing has changed. However, I was able to fine space on Tuesday 05/10/18 to accommodate this patient. If Tuesday is ok, I will get her placed on the schedule for Lab/MD/Taxol on Tuesday 04/10/18. Patient called today asking about her appts.and I assured her that I would give her a call today with her appt Date and Time.

## 2018-04-09 ENCOUNTER — Inpatient Hospital Stay: Payer: BLUE CROSS/BLUE SHIELD

## 2018-04-09 ENCOUNTER — Inpatient Hospital Stay (HOSPITAL_BASED_OUTPATIENT_CLINIC_OR_DEPARTMENT_OTHER): Payer: BLUE CROSS/BLUE SHIELD | Admitting: Oncology

## 2018-04-09 ENCOUNTER — Other Ambulatory Visit: Payer: Self-pay | Admitting: Oncology

## 2018-04-09 VITALS — BP 103/70 | HR 90 | Temp 98.5°F | Resp 16 | Wt 165.0 lb

## 2018-04-09 DIAGNOSIS — F17210 Nicotine dependence, cigarettes, uncomplicated: Secondary | ICD-10-CM

## 2018-04-09 DIAGNOSIS — Z90722 Acquired absence of ovaries, bilateral: Secondary | ICD-10-CM

## 2018-04-09 DIAGNOSIS — C50811 Malignant neoplasm of overlapping sites of right female breast: Secondary | ICD-10-CM | POA: Diagnosis not present

## 2018-04-09 DIAGNOSIS — Z809 Family history of malignant neoplasm, unspecified: Secondary | ICD-10-CM

## 2018-04-09 DIAGNOSIS — Z170 Estrogen receptor positive status [ER+]: Secondary | ICD-10-CM | POA: Diagnosis not present

## 2018-04-09 DIAGNOSIS — Z9011 Acquired absence of right breast and nipple: Secondary | ICD-10-CM

## 2018-04-09 DIAGNOSIS — C50919 Malignant neoplasm of unspecified site of unspecified female breast: Secondary | ICD-10-CM

## 2018-04-09 DIAGNOSIS — Z5111 Encounter for antineoplastic chemotherapy: Secondary | ICD-10-CM

## 2018-04-09 LAB — COMPREHENSIVE METABOLIC PANEL
ALT: 15 U/L (ref 14–54)
AST: 21 U/L (ref 15–41)
Albumin: 4.6 g/dL (ref 3.5–5.0)
Alkaline Phosphatase: 75 U/L (ref 38–126)
Anion gap: 10 (ref 5–15)
BILIRUBIN TOTAL: 0.3 mg/dL (ref 0.3–1.2)
BUN: 12 mg/dL (ref 6–20)
CHLORIDE: 104 mmol/L (ref 101–111)
CO2: 23 mmol/L (ref 22–32)
CREATININE: 0.73 mg/dL (ref 0.44–1.00)
Calcium: 9.4 mg/dL (ref 8.9–10.3)
GFR calc Af Amer: >60 mL/min (ref >60)
GLUCOSE: 90 mg/dL (ref 65–99)
Potassium: 3.8 mmol/L (ref 3.5–5.1)
Sodium: 137 mmol/L (ref 135–145)
Total Protein: 7.7 g/dL (ref 6.5–8.1)

## 2018-04-09 LAB — CBC WITH DIFFERENTIAL/PLATELET
Basophils Absolute: 0.1 10*3/uL (ref 0–0.1)
Basophils Relative: 1 %
EOS ABS: 0.1 10*3/uL (ref 0–0.7)
EOS PCT: 1 %
HCT: 37.3 % (ref 35.0–47.0)
Hemoglobin: 13.1 g/dL (ref 12.0–16.0)
LYMPHS ABS: 2 10*3/uL (ref 1.0–3.6)
Lymphocytes Relative: 19 %
MCH: 30.5 pg (ref 26.0–34.0)
MCHC: 35.1 g/dL (ref 32.0–36.0)
MCV: 87 fL (ref 80.0–100.0)
MONO ABS: 0.8 10*3/uL (ref 0.2–0.9)
Monocytes Relative: 8 %
Neutro Abs: 7.5 10*3/uL — ABNORMAL HIGH (ref 1.4–6.5)
Neutrophils Relative %: 71 %
PLATELETS: 325 10*3/uL (ref 150–440)
RBC: 4.29 MIL/uL (ref 3.80–5.20)
RDW: 12.7 % (ref 11.5–14.5)
WBC: 10.4 10*3/uL (ref 3.6–11.0)

## 2018-04-09 MED ORDER — HEPARIN SOD (PORK) LOCK FLUSH 100 UNIT/ML IV SOLN
500.0000 [IU] | Freq: Once | INTRAVENOUS | Status: AC | PRN
  Administered 2018-04-09: 500 [IU]
  Filled 2018-04-09: qty 5

## 2018-04-09 MED ORDER — SODIUM CHLORIDE 0.9 % IV SOLN
Freq: Once | INTRAVENOUS | Status: AC
  Administered 2018-04-09: 15:00:00 via INTRAVENOUS
  Filled 2018-04-09: qty 1000

## 2018-04-09 MED ORDER — FAMOTIDINE IN NACL 20-0.9 MG/50ML-% IV SOLN
20.0000 mg | Freq: Once | INTRAVENOUS | Status: AC
  Administered 2018-04-09: 20 mg via INTRAVENOUS
  Filled 2018-04-09: qty 50

## 2018-04-09 MED ORDER — DIPHENHYDRAMINE HCL 50 MG/ML IJ SOLN
50.0000 mg | Freq: Once | INTRAMUSCULAR | Status: AC
  Administered 2018-04-09: 50 mg via INTRAVENOUS
  Filled 2018-04-09: qty 1

## 2018-04-09 MED ORDER — SODIUM CHLORIDE 0.9% FLUSH
10.0000 mL | INTRAVENOUS | Status: DC | PRN
  Filled 2018-04-09: qty 10

## 2018-04-09 MED ORDER — SODIUM CHLORIDE 0.9 % IV SOLN
80.0000 mg/m2 | Freq: Once | INTRAVENOUS | Status: AC
  Administered 2018-04-09: 144 mg via INTRAVENOUS
  Filled 2018-04-09: qty 24

## 2018-04-09 MED ORDER — SODIUM CHLORIDE 0.9 % IV SOLN
20.0000 mg | Freq: Once | INTRAVENOUS | Status: AC
  Administered 2018-04-09: 20 mg via INTRAVENOUS
  Filled 2018-04-09: qty 2

## 2018-04-09 NOTE — Progress Notes (Signed)
Hematology/Oncology Follow Up Note [facility] [phone] [phone]  Patient Care Team: Juluis Pitch, MD as PCP - General (Family Medicine)   Name of the patient: Theresa Chaney  [id number]  23-Apr-1981   Date of visit: 04/09/18 REASON FOR VISIT Follow up for treatment of breast cancer  History of presenting illness 37 year old female with past medical history listed as below presented for evaluation of newly diagnosed right breast cancer. Patient self palpated a right breast mass 2 months ago during bathing. This triggered workup including mammogram and ultrasound. 10/05/2017 mammogram diagnostic breast tomo bilaterally showed multicentric area had a suspicion of breast cancer. 1  highly suspicious irregular mass with architectural distortion in the upper right breast at posterior depth measuring approximately 2 cm greatest dimension with associated architectural distortion and pleomorphic micro-calcifications. On ultrasound of vague hypoechoic masslike areas seen in the right breast at 11:30 o'clock axis. 2 second areas irregular hypoechoic mass in the right breast at the 11:30 o'clock axis, 6 cm from the nipple, at the superficial depth measuring 1.2 cm, likely correlate for a highly suspicious 1.4 cm extent of pleomorphic and a fine linear branching calcifications. 3 additional highly suspicious pleomorphic calcifications within the upper retroareolar right breast extending to this subareolar right breast, measuring 3.7 cm extending.  Right axilla was evaluated with ultrasound showing no enlarged or morphologically abnormal lymph nodes.  Patient underwent stereotactic biopsy, on 10/11/2017. Pathology showed A, carcinoma in situ intermediate to high grade with necrosis and calcifications at the right breast 11:30. B, right breast 5:30 subareolar, invasive carcinoma with diffusely infiltrative pattern in the lymphovascular invasion. In a  background of intermediate grade carcinoma in situ with necrosis and calcifications. C right breast 12:30, posterior common invasive mammary carcinoma of her type, associated with high-grade DCIS with necrosis and calcifications. Right breast 5:30 subareolar, ER +75%, PR negative HER-2 negative. Right breast 12:30 posterior, and ER +90%, and PR negative, and HER-2 negative.  Her aunt has a history of melanoma. . She is married, and she has a Psychiatrist and a biological daughter who is 60 years old. . She reports that she has a history of heart murmur which was diagnosed by primary care physician. She's never had any other health problems.   #Genetic testing  INTERVAL HISTORY Patient presents for follow up of management of breast cancer. She underwent right mastectomy and right axillary dissection. Pathology showed invasive mammary carcinoma, 8.7 cm, grade 3,  with lymphovascular invasion, surgical margins are close but negative.  1 out of 3 sentinel lymph nodes positive, right axillary dissection showed 2 out of the 12 lymph nodes positive for macro metastasis, 1 out of 12 lymph nodes positive for micrometastasis.  Pathological staging is ypT3 ypN2  She had right immediate breast reconstruction with placement of tissue expanders.Patient also chose to have bilateral salpingo-oophorectomy.    Patient presents clinic to discuss about adjuvant chemotherapy plan for breast cancer.  Minimal pain of her surgical sites.  Denies any fever, chills.  Denies any hot flushes.  Cancer TREATMENT Neoadjuvant ddAC +1 week of Taxol, due to lack of response, surgery was offered. Case was discussed on breast tumor board. 03/19/2018 S/p right mastectomy and right axillary dissection, immediate breast reconstruction with placement of expanders. Also had elective bilateral salpingo-oophorectomy..    Review of Systems  Constitutional: Negative for diaphoresis, fever, malaise/fatigue and weight loss.  HENT: Negative  for ear pain, hearing loss, nosebleeds and tinnitus.   Eyes: Negative for blurred vision, double  vision, photophobia and discharge.  Respiratory: Negative for cough, hemoptysis and sputum production.   Cardiovascular: Negative for chest pain, palpitations, orthopnea and leg swelling.  Gastrointestinal: Negative for abdominal pain, diarrhea, heartburn, nausea and vomiting.  Genitourinary: Negative for dysuria, frequency, hematuria and urgency.  Musculoskeletal: Negative for back pain, myalgias and neck pain.  Skin: Negative for itching and rash.  Neurological: Negative for dizziness, tremors, speech change, focal weakness and headaches.  Endo/Heme/Allergies: Negative for environmental allergies and polydipsia. Does not bruise/bleed easily.  Psychiatric/Behavioral: Negative for depression, hallucinations and substance abuse. The patient is not nervous/anxious.     No Known Allergies  Patient Active Problem List   Diagnosis Date Noted  . Estrogen receptor positive status (ER+) 04/04/2018  . Breast cancer of upper-outer quadrant of right female breast ([place]) 03/19/2018  . Family history of cancer   . Malignant neoplasm of overlapping sites of right breast in female, estrogen receptor positive ([place]) 10/19/2017     Past Medical History:  Diagnosis Date  . Anemia   . BRCA negative 11/26/2017  . Breast cancer ([place]) 10/11/2017   Multifocal, ER positive, PR negative, HER-2 negative.  . Chronic bronchitis ([place]) 11/2017  . COPD (chronic obstructive pulmonary disease) ([place])    MILD PER CXR  . Depression   . Family history of cancer   . GERD (gastroesophageal reflux disease)   . Headache    MIGRAINES  . Heart murmur    ASYMPTOMATIC  . Personal history of chemotherapy    current for right breast ca     Past Surgical History:  Procedure Laterality Date  . AXILLARY LYMPH NODE DISSECTION Right 03/19/2018   Procedure: AXILLARY LYMPH NODE DISSECTION;  Surgeon: Robert Bellow, MD;   Location: [facility] ORS;  Service: General;  Laterality: Right;  . BREAST BIOPSY Right 10/11/2017   12:30 posterior coil clip invasive mammary carcinoma  . BREAST BIOPSY Right 10/11/2017   11:30 middle depth ribbon clip DCIS  . BREAST BIOPSY Right 10/11/2017   5:30 anterior depth x shape invasive ductal carcinoma  . BREAST RECONSTRUCTION WITH PLACEMENT OF TISSUE EXPANDER AND FLEX HD (ACELLULAR HYDRATED DERMIS) Right 03/19/2018   Procedure: BREAST RECONSTRUCTION WITH PLACEMENT OF TISSUE EXPANDER AND FLEX HD (ACELLULAR HYDRATED DERMIS);  Surgeon: Wallace Going, DO;  Location: [facility] ORS;  Service: Plastics;  Laterality: Right;  . LAPAROSCOPIC BILATERAL SALPINGO OOPHERECTOMY Bilateral 03/19/2018   Procedure: LAPAROSCOPIC BILATERAL SALPINGO OOPHORECTOMY;  Surgeon: Benjaman Kindler, MD;  Location: [facility] ORS;  Service: Gynecology;  Laterality: Bilateral;  . MASTECTOMY W/ SENTINEL NODE BIOPSY Right 03/19/2018   Procedure: MASTECTOMY WITH SENTINEL LYMPH NODE BIOPSY;  Surgeon: Robert Bellow, MD;  Location: [facility] ORS;  Service: General;  Laterality: Right;  . PORTACATH PLACEMENT Left 10/24/2017   Procedure: INSERTION PORT-A-CATH;  Surgeon: Robert Bellow, MD;  Location: [facility] ORS;  Service: General;  Laterality: Left;    Social History   Socioeconomic History  . Marital status: Married    Spouse name: Not on file  . Number of children: Not on file  . Years of education: Not on file  . Highest education level: Not on file  Occupational History  . Occupation: Occupational psychologist    Comment: [employer]  . Financial resource strain: Not on file  . Food insecurity:    Worry: Not on file    Inability: Not on file  . Transportation needs:    Medical: Not on file    Non-medical: Not  on file  Tobacco Use  . Smoking status: Current Every Day Smoker    Packs/day: 0.50    Years: 5.00    Pack years: 2.50    Types: Cigarettes    Last attempt to quit: 12/12/2017     Years since quitting: 0.3  . Smokeless tobacco: Never Used  Substance and Sexual Activity  . Alcohol use: No    Frequency: Never  . Drug use: No  . Sexual activity: Yes    Birth control/protection: Injection  Lifestyle  . Physical activity:    Days per week: Not on file    Minutes per session: Not on file  . Stress: Not on file  Relationships  . Social connections:    Talks on phone: Not on file    Gets together: Not on file    Attends religious service: Not on file    Active member of club or organization: Not on file    Attends meetings of clubs or organizations: Not on file    Relationship status: Not on file  . Intimate partner violence:    Fear of current or ex partner: Not on file    Emotionally abused: Not on file    Physically abused: Not on file    Forced sexual activity: Not on file  Other Topics Concern  . Not on file  Social History Narrative  . Not on file     Family History  Problem Relation Age of Onset  . Melanoma Maternal Aunt        other aunts with BCC/SCC/Melanoma  . Diabetes Father   . Hypertension Father   . Hyperlipidemia Father   . Bladder Cancer Maternal Grandmother   . Cervical cancer Maternal Aunt 64       daughter w/ cervical cancer as well  . Melanoma Maternal Uncle        other uncles with BCC/SCC/Melanoma     Current Outpatient Medications:  .  albuterol (PROVENTIL HFA;VENTOLIN HFA) 108 (90 Base) MCG/ACT inhaler, Inhale 2 puffs into the lungs every 6 (six) hours as needed for wheezing or shortness of breath (Cough)., Disp: 1 Inhaler, Rfl: 2 .  ALPRAZolam (XANAX) 0.25 MG tablet, Take 0.25 mg at bedtime as needed by mouth for anxiety or sleep. , Disp: , Rfl:  .  Calcium Carb-Cholecalciferol (CALCIUM 600 + D PO), Take 1 tablet daily by mouth., Disp: , Rfl:  .  chlorhexidine (PERIDEX) 0.12 % solution, USE AS DIRECTED 15MLS IN THE MOUTH OR THROAT TWICE A DAY, Disp: 473 mL, Rfl: 1 .  docusate sodium (COLACE) 100 MG capsule, Take 1 capsule  (100 mg total) by mouth 2 (two) times daily. (Patient taking differently: Take 100 mg by mouth 2 (two) times daily. While on chemo), Disp: 60 capsule, Rfl: 0 .  docusate sodium (COLACE) 100 MG capsule, Take 1 capsule (100 mg total) by mouth 2 (two) times daily. To keep stools soft, Disp: 30 capsule, Rfl: 0 .  doxylamine, Sleep, (UNISOM) 25 MG tablet, Take 25 mg by mouth at bedtime as needed for sleep., Disp: , Rfl:  .  escitalopram (LEXAPRO) 20 MG tablet, Take 20 mg by mouth every morning. , Disp: , Rfl:  .  esomeprazole (NEXIUM) 40 MG capsule, Take 40 mg by mouth every morning. , Disp: , Rfl:  .  ibuprofen (ADVIL,MOTRIN) 800 MG tablet, Take 800 mg by mouth daily as needed for moderate pain., Disp: , Rfl:  .  ibuprofen (ADVIL,MOTRIN) 800 MG tablet, Take 1 tablet (  800 mg total) by mouth every 8 (eight) hours as needed for moderate pain., Disp: 30 tablet, Rfl: 1 .  lidocaine (XYLOCAINE) 2 % solution, Use as directed 10 mLs in the mouth or throat every 4 (four) hours as needed for mouth pain., Disp: 200 mL, Rfl: 0 .  lidocaine-prilocaine (EMLA) cream, Apply to affected area once (Patient taking differently: Apply 1 application topically daily as needed (port access). ), Disp: 30 g, Rfl: 3 .  loratadine (CLARITIN) 10 MG tablet, Take 10 mg by mouth daily. , Disp: , Rfl:  .  oxycodone (OXY-IR) 5 MG capsule, Take 1 capsule (5 mg total) by mouth every 6 (six) hours as needed for pain., Disp: 15 capsule, Rfl: 0 .  prochlorperazine (COMPAZINE) 10 MG tablet, Take 1 tablet (10 mg total) by mouth every 6 (six) hours as needed (Nausea or vomiting)., Disp: 30 tablet, Rfl: 1 .  promethazine (PHENERGAN) 25 MG tablet, Take 1 tablet (25 mg total) by mouth every 6 (six) hours as needed for nausea or vomiting., Disp: 30 tablet, Rfl: 0   Physical exam:  Vitals:   04/10/18 0902  BP: 103/70  Pulse: 90  Resp: 16  Temp: 98.5 F (36.9 C)  Weight: 165 lb (74.8 kg)  ECOG 0 Physical Exam  Constitutional: She is oriented  to person, place, and time and well-developed, well-nourished, and in no distress. No distress.  HENT:  Head: Normocephalic and atraumatic.  Nose: Nose normal.  Mouth/Throat: Oropharynx is clear and moist. No oropharyngeal exudate.  No thrush,  Eyes: Pupils are equal, round, and reactive to light. Conjunctivae and EOM are normal. Left eye exhibits no discharge. No scleral icterus.  Neck: Normal range of motion. Neck supple. No JVD present.  Cardiovascular: Normal rate, regular rhythm and normal heart sounds.  No murmur heard. Pulmonary/Chest: Effort normal and breath sounds normal. No respiratory distress. She has no wheezes. She has no rales. She exhibits no tenderness.  Abdominal: Soft. Bowel sounds are normal. She exhibits no distension and no mass. There is no tenderness. There is no rebound.  Musculoskeletal: Normal range of motion. She exhibits no edema or tenderness.  Lymphadenopathy:    She has no cervical adenopathy.  Neurological: She is alert and oriented to person, place, and time. No cranial nerve deficit. She exhibits normal muscle tone. Coordination normal.  Skin: Skin is warm and dry. No rash noted. She is not diaphoretic. No erythema.  Psychiatric: Mood, affect and judgment normal.  Breast exam is performed in seated and lying down position. Patient is status post right mastectomy with tissue expander.   CMP Latest Ref Rng & Units 04/09/2018  Glucose 65 - 99 mg/dL 90  BUN 6 - 20 mg/dL 12  Creatinine 0.44 - 1.00 mg/dL 0.73  Sodium 135 - 145 mmol/L 137  Potassium 3.5 - 5.1 mmol/L 3.8  Chloride 101 - 111 mmol/L 104  CO2 22 - 32 mmol/L 23  Calcium 8.9 - 10.3 mg/dL 9.4  Total Protein 6.5 - 8.1 g/dL 7.7  Total Bilirubin 0.3 - 1.2 mg/dL 0.3  Alkaline Phos 38 - 126 U/L 75  AST 15 - 41 U/L 21  ALT 14 - 54 U/L 15   CBC Latest Ref Rng & Units 04/09/2018  WBC 3.6 - 11.0 K/uL 10.4  Hemoglobin 12.0 - 16.0 g/dL 13.1  Hematocrit 35.0 - 47.0 % 37.3  Platelets 150 - 440 K/uL 325      Assessment and plan- Patient is a 37 y.o. female presents for evaluation  of newly diagnosed multicentric right breast cancer.  Cancer Staging Breast cancer of upper-outer quadrant of right female breast [facility]) Staging form: Breast, AJCC 8th Edition - Clinical: G2, ER+, PR+, HER2- - Signed by Earlie Server, MD on 04/05/2018 - Pathologic stage from 04/04/2018: No Stage Recommended (ypT3, pN2, cM0, G3, ER+, PR-, HER2-) - Signed by Earlie Server, MD on 04/04/2018     1. Malignant neoplasm of overlapping sites of right breast in female, estrogen receptor positive ([place])   2. Estrogen receptor positive status (ER+)   3. Encounter for antineoplastic chemotherapy    # Image was reviewed independently and were discussed with patient. No obvious image evidence of metastatic disease. Small lung nodules need follow up.  Proceed with weekly Taxol to complete 11 weeks course.  Refer to RadOnc for evaluation of adjuvant Radiation after finishing chemotherapy.  .  # After she finishes chemotherapy and Radiation, she maybe eligible for NATALEE trial in which she has 50% chance to be randomized to adjuvant Ribociclib with endocrine therapy. Has referred the to clinical trial coordinator   # bilateral oophorectomy, plan Aromatase inhibitor along with NATALEE trial.  # Advise patient to obtain dental clearance for prophylactic bisphosphonate use.   Earlie Server, MD, PhD Hematology Oncology [facility] Pager- [phone] 04/09/18

## 2018-04-10 ENCOUNTER — Encounter: Payer: Self-pay | Admitting: Oncology

## 2018-04-16 ENCOUNTER — Encounter: Payer: Self-pay | Admitting: Oncology

## 2018-04-16 ENCOUNTER — Inpatient Hospital Stay: Payer: BLUE CROSS/BLUE SHIELD

## 2018-04-16 ENCOUNTER — Other Ambulatory Visit: Payer: Self-pay | Admitting: Internal Medicine

## 2018-04-16 ENCOUNTER — Inpatient Hospital Stay: Payer: BLUE CROSS/BLUE SHIELD | Attending: Oncology | Admitting: Oncology

## 2018-04-16 VITALS — BP 112/77 | HR 91 | Temp 96.7°F | Resp 14 | Wt 166.0 lb

## 2018-04-16 DIAGNOSIS — C50811 Malignant neoplasm of overlapping sites of right female breast: Secondary | ICD-10-CM | POA: Diagnosis not present

## 2018-04-16 DIAGNOSIS — R000 Tachycardia, unspecified: Secondary | ICD-10-CM | POA: Diagnosis not present

## 2018-04-16 DIAGNOSIS — Z9011 Acquired absence of right breast and nipple: Secondary | ICD-10-CM

## 2018-04-16 DIAGNOSIS — Z5111 Encounter for antineoplastic chemotherapy: Secondary | ICD-10-CM | POA: Diagnosis present

## 2018-04-16 DIAGNOSIS — Z90722 Acquired absence of ovaries, bilateral: Secondary | ICD-10-CM | POA: Diagnosis not present

## 2018-04-16 DIAGNOSIS — R918 Other nonspecific abnormal finding of lung field: Secondary | ICD-10-CM

## 2018-04-16 DIAGNOSIS — R0602 Shortness of breath: Secondary | ICD-10-CM | POA: Diagnosis not present

## 2018-04-16 DIAGNOSIS — Z170 Estrogen receptor positive status [ER+]: Secondary | ICD-10-CM

## 2018-04-16 LAB — COMPREHENSIVE METABOLIC PANEL
ALK PHOS: 61 U/L (ref 38–126)
ALT: 20 U/L (ref 14–54)
ANION GAP: 8 (ref 5–15)
AST: 26 U/L (ref 15–41)
Albumin: 4.1 g/dL (ref 3.5–5.0)
BUN: 11 mg/dL (ref 6–20)
CALCIUM: 9.1 mg/dL (ref 8.9–10.3)
CO2: 24 mmol/L (ref 22–32)
CREATININE: 0.59 mg/dL (ref 0.44–1.00)
Chloride: 104 mmol/L (ref 101–111)
GFR calc Af Amer: >60 mL/min (ref >60)
GFR calc non Af Amer: >60 mL/min (ref >60)
GLUCOSE: 106 mg/dL — AB (ref 65–99)
Potassium: 3.5 mmol/L (ref 3.5–5.1)
SODIUM: 136 mmol/L (ref 135–145)
Total Bilirubin: 0.4 mg/dL (ref 0.3–1.2)
Total Protein: 7.2 g/dL (ref 6.5–8.1)

## 2018-04-16 LAB — CBC WITH DIFFERENTIAL/PLATELET
BASOS PCT: 1 %
Basophils Absolute: 0 10*3/uL (ref 0–0.1)
Eosinophils Absolute: 0.1 10*3/uL (ref 0–0.7)
Eosinophils Relative: 1 %
HCT: 35.5 % (ref 35.0–47.0)
HEMOGLOBIN: 12.4 g/dL (ref 12.0–16.0)
Lymphocytes Relative: 27 %
Lymphs Abs: 1.9 10*3/uL (ref 1.0–3.6)
MCH: 30.2 pg (ref 26.0–34.0)
MCHC: 35 g/dL (ref 32.0–36.0)
MCV: 86.4 fL (ref 80.0–100.0)
MONOS PCT: 7 %
Monocytes Absolute: 0.5 10*3/uL (ref 0.2–0.9)
NEUTROS ABS: 4.4 10*3/uL (ref 1.4–6.5)
Neutrophils Relative %: 64 %
Platelets: 247 10*3/uL (ref 150–440)
RBC: 4.11 MIL/uL (ref 3.80–5.20)
RDW: 12.4 % (ref 11.5–14.5)
WBC: 6.8 10*3/uL (ref 3.6–11.0)

## 2018-04-16 MED ORDER — HEPARIN SOD (PORK) LOCK FLUSH 100 UNIT/ML IV SOLN: 500.0000 [IU] | Freq: Once | INTRAVENOUS | Status: DC | PRN

## 2018-04-16 MED ORDER — DIPHENHYDRAMINE HCL 50 MG/ML IJ SOLN
50.0000 mg | Freq: Once | INTRAMUSCULAR | Status: AC
  Administered 2018-04-16: 50 mg via INTRAVENOUS
  Filled 2018-04-16: qty 1

## 2018-04-16 MED ORDER — FAMOTIDINE IN NACL 20-0.9 MG/50ML-% IV SOLN
20.0000 mg | Freq: Once | INTRAVENOUS | Status: AC
  Administered 2018-04-16: 20 mg via INTRAVENOUS
  Filled 2018-04-16: qty 50

## 2018-04-16 MED ORDER — HEPARIN SOD (PORK) LOCK FLUSH 100 UNIT/ML IV SOLN
500.0000 [IU] | Freq: Once | INTRAVENOUS | Status: AC
  Administered 2018-04-16: 500 [IU] via INTRAVENOUS
  Filled 2018-04-16: qty 5

## 2018-04-16 MED ORDER — SODIUM CHLORIDE 0.9% FLUSH
10.0000 mL | INTRAVENOUS | Status: DC | PRN
  Filled 2018-04-16: qty 10

## 2018-04-16 MED ORDER — SODIUM CHLORIDE 0.9 % IV SOLN
20.0000 mg | Freq: Once | INTRAVENOUS | Status: AC
  Administered 2018-04-16: 20 mg via INTRAVENOUS
  Filled 2018-04-16: qty 2

## 2018-04-16 MED ORDER — SODIUM CHLORIDE 0.9% FLUSH
10.0000 mL | INTRAVENOUS | Status: DC | PRN
  Administered 2018-04-16: 10 mL via INTRAVENOUS
  Filled 2018-04-16: qty 10

## 2018-04-16 MED ORDER — SODIUM CHLORIDE 0.9 % IV SOLN
Freq: Once | INTRAVENOUS | Status: AC
  Administered 2018-04-16: 09:00:00 via INTRAVENOUS
  Filled 2018-04-16: qty 1000

## 2018-04-16 MED ORDER — SODIUM CHLORIDE 0.9 % IV SOLN
80.0000 mg/m2 | Freq: Once | INTRAVENOUS | Status: AC
  Administered 2018-04-16: 144 mg via INTRAVENOUS
  Filled 2018-04-16: qty 24

## 2018-04-16 NOTE — Progress Notes (Signed)
Hematology/Oncology Follow Up Note [facility] [phone] [phone]  Patient Care Team: Juluis Pitch, MD as PCP - General (Family Medicine)   Name of the patient: Stepheny Canal  [id number]  1981/04/20   Date of visit: 04/16/18 REASON FOR VISIT Treatment of breast cancer  History of presenting illness  Patient last seen by Dr. Tasia Catchings primary medical oncologist on 04/09/2018 where she was evaluated prior to cycle 2 of weekly Taxol.  Patient with recent diagnosis of right breast cancer.  Patient self palpated a right breast mass in September 2018 during bathing.  This triggered a full work-up including mammogram and ultrasound in October 2018.  Patient had biopsy on 10/11/2017.  Pathology revealed carcinoma in situ.  Had right mastectomy and reconstruction performed on 03/19/2017 with placement of expanders.  Did well with no complications.  Elected to have bilateral salpingo-oorhorectomy.   Plan is to complete Taxol therapy followed by whole  breast radiation.  INTERVAL HISTORY Patient is status post 2 cycles of Taxol.  Last cycle 04/09/2018.  At last visit patient was doing well.  Had no complaints.  She has minimal pain at her surgical sites.  Had surgery in between cycle 1 and cycle 2 due to lack of response.  She is s/p right mastectomy dissection with breast reconstruction with placement of expanders.  Elected to have bilateral salpingo-oorhorectomy.   Had CT chest/abdomen/pelvis with contrast and whole body bone on 04/06/18.  Scans revealed no definite evidence of metastatic disease.  Small pulmonary nodules measuring up to 5 mm in the right upper lobe  that will require follow-up.   Today, she offers no complaints.  She denies any neuropathy.  She denies shortness of breath, chest pain, constipation or diarrhea.     Review of Systems  Constitutional: Negative.  Negative for chills, fever, malaise/fatigue and weight loss.  HENT: Negative  for congestion and ear pain.   Eyes: Negative.  Negative for blurred vision and double vision.  Respiratory: Negative.  Negative for cough, sputum production and shortness of breath.   Cardiovascular: Negative.  Negative for chest pain, palpitations and leg swelling.  Gastrointestinal: Negative.  Negative for abdominal pain, constipation, diarrhea, nausea and vomiting.  Genitourinary: Negative for dysuria, frequency and urgency.  Musculoskeletal: Negative for back pain and falls.  Skin: Negative.  Negative for rash.  Neurological: Negative.  Negative for weakness and headaches.  Endo/Heme/Allergies: Negative.  Does not bruise/bleed easily.  Psychiatric/Behavioral: Negative.  Negative for depression. The patient is not nervous/anxious and does not have insomnia.     No Known Allergies  Patient Active Problem List   Diagnosis Date Noted  . Estrogen receptor positive status (ER+) 04/04/2018  . Breast cancer of upper-outer quadrant of right female breast ([place]) 03/19/2018  . Family history of cancer   . Malignant neoplasm of overlapping sites of right breast in female, estrogen receptor positive ([place]) 10/19/2017     Past Medical History:  Diagnosis Date  . Anemia   . BRCA negative 11/26/2017  . Breast cancer ([place]) 10/11/2017   Multifocal, ER positive, PR negative, HER-2 negative.  . Chronic bronchitis ([place]) 11/2017  . COPD (chronic obstructive pulmonary disease) ([place])    MILD PER CXR  . Depression   . Family history of cancer   . GERD (gastroesophageal reflux disease)   . Headache    MIGRAINES  . Heart murmur    ASYMPTOMATIC  . Personal history of chemotherapy    current for right breast ca  Past Surgical History:  Procedure Laterality Date  . AXILLARY LYMPH NODE DISSECTION Right 03/19/2018   Procedure: AXILLARY LYMPH NODE DISSECTION;  Surgeon: Robert Bellow, MD;  Location: [facility] ORS;  Service: General;  Laterality: Right;  . BREAST BIOPSY Right 10/11/2017   12:30  posterior coil clip invasive mammary carcinoma  . BREAST BIOPSY Right 10/11/2017   11:30 middle depth ribbon clip DCIS  . BREAST BIOPSY Right 10/11/2017   5:30 anterior depth x shape invasive ductal carcinoma  . BREAST RECONSTRUCTION WITH PLACEMENT OF TISSUE EXPANDER AND FLEX HD (ACELLULAR HYDRATED DERMIS) Right 03/19/2018   Procedure: BREAST RECONSTRUCTION WITH PLACEMENT OF TISSUE EXPANDER AND FLEX HD (ACELLULAR HYDRATED DERMIS);  Surgeon: Wallace Going, DO;  Location: [facility] ORS;  Service: Plastics;  Laterality: Right;  . LAPAROSCOPIC BILATERAL SALPINGO OOPHERECTOMY Bilateral 03/19/2018   Procedure: LAPAROSCOPIC BILATERAL SALPINGO OOPHORECTOMY;  Surgeon: Benjaman Kindler, MD;  Location: [facility] ORS;  Service: Gynecology;  Laterality: Bilateral;  . MASTECTOMY W/ SENTINEL NODE BIOPSY Right 03/19/2018   Procedure: MASTECTOMY WITH SENTINEL LYMPH NODE BIOPSY;  Surgeon: Robert Bellow, MD;  Location: [facility] ORS;  Service: General;  Laterality: Right;  . PORTACATH PLACEMENT Left 10/24/2017   Procedure: INSERTION PORT-A-CATH;  Surgeon: Robert Bellow, MD;  Location: [facility] ORS;  Service: General;  Laterality: Left;    Social History   Socioeconomic History  . Marital status: Married    Spouse name: Not on file  . Number of children: Not on file  . Years of education: Not on file  . Highest education level: Not on file  Occupational History  . Occupation: Occupational psychologist    Comment: [employer]  . Financial resource strain: Not on file  . Food insecurity:    Worry: Not on file    Inability: Not on file  . Transportation needs:    Medical: Not on file    Non-medical: Not on file  Tobacco Use  . Smoking status: Current Every Day Smoker    Packs/day: 0.50    Years: 5.00    Pack years: 2.50    Types: Cigarettes    Last attempt to quit: 12/12/2017    Years since quitting: 0.3  . Smokeless tobacco: Never Used  Substance and Sexual Activity  .  Alcohol use: No    Frequency: Never  . Drug use: No  . Sexual activity: Yes    Birth control/protection: Injection  Lifestyle  . Physical activity:    Days per week: Not on file    Minutes per session: Not on file  . Stress: Not on file  Relationships  . Social connections:    Talks on phone: Not on file    Gets together: Not on file    Attends religious service: Not on file    Active member of club or organization: Not on file    Attends meetings of clubs or organizations: Not on file    Relationship status: Not on file  . Intimate partner violence:    Fear of current or ex partner: Not on file    Emotionally abused: Not on file    Physically abused: Not on file    Forced sexual activity: Not on file  Other Topics Concern  . Not on file  Social History Narrative  . Not on file     Family History  Problem Relation Age of Onset  . Melanoma Maternal Aunt        other aunts  with BCC/SCC/Melanoma  . Diabetes Father   . Hypertension Father   . Hyperlipidemia Father   . Bladder Cancer Maternal Grandmother   . Cervical cancer Maternal Aunt 64       daughter w/ cervical cancer as well  . Melanoma Maternal Uncle        other uncles with BCC/SCC/Melanoma     Current Outpatient Medications:  .  ALPRAZolam (XANAX) 0.25 MG tablet, Take 0.25 mg at bedtime as needed by mouth for anxiety or sleep. , Disp: , Rfl:  .  Calcium Carb-Cholecalciferol (CALCIUM 600 + D PO), Take 1 tablet daily by mouth., Disp: , Rfl:  .  doxylamine, Sleep, (UNISOM) 25 MG tablet, Take 25 mg by mouth at bedtime as needed for sleep., Disp: , Rfl:  .  escitalopram (LEXAPRO) 20 MG tablet, Take 20 mg by mouth every morning. , Disp: , Rfl:  .  esomeprazole (NEXIUM) 40 MG capsule, Take 40 mg by mouth every morning. , Disp: , Rfl:  .  ibuprofen (ADVIL,MOTRIN) 800 MG tablet, Take 1 tablet (800 mg total) by mouth every 8 (eight) hours as needed for moderate pain., Disp: 30 tablet, Rfl: 1 .  lidocaine-prilocaine  (EMLA) cream, Apply to affected area once (Patient taking differently: Apply 1 application topically daily as needed (port access). ), Disp: 30 g, Rfl: 3 .  loratadine (CLARITIN) 10 MG tablet, Take 10 mg by mouth daily. , Disp: , Rfl:  .  prochlorperazine (COMPAZINE) 10 MG tablet, Take 1 tablet (10 mg total) by mouth every 6 (six) hours as needed (Nausea or vomiting)., Disp: 30 tablet, Rfl: 1 .  promethazine (PHENERGAN) 25 MG tablet, Take 1 tablet (25 mg total) by mouth every 6 (six) hours as needed for nausea or vomiting., Disp: 30 tablet, Rfl: 0 .  albuterol (PROVENTIL HFA;VENTOLIN HFA) 108 (90 Base) MCG/ACT inhaler, Inhale 2 puffs into the lungs every 6 (six) hours as needed for wheezing or shortness of breath (Cough). (Patient not taking: Reported on 04/16/2018), Disp: 1 Inhaler, Rfl: 2 .  chlorhexidine (PERIDEX) 0.12 % solution, USE AS DIRECTED 15MLS IN THE MOUTH OR THROAT TWICE A DAY (Patient not taking: Reported on 04/16/2018), Disp: 473 mL, Rfl: 1 .  lidocaine (XYLOCAINE) 2 % solution, Use as directed 10 mLs in the mouth or throat every 4 (four) hours as needed for mouth pain. (Patient not taking: Reported on 04/16/2018), Disp: 200 mL, Rfl: 0 No current facility-administered medications for this visit.   Facility-Administered Medications Ordered in Other Visits:  .  dexamethasone (DECADRON) 20 mg in sodium chloride 0.9 % 50 mL IVPB, 20 mg, Intravenous, Once, Charlaine Dalton R, MD, 20 mg at 04/16/18 0934 .  heparin lock flush 100 unit/mL, 500 Units, Intravenous, Once, Earlie Server, MD .  heparin lock flush 100 unit/mL, 500 Units, Intracatheter, Once PRN, Cammie Sickle, MD .  PACLitaxel (TAXOL) 144 mg in sodium chloride 0.9 % 250 mL chemo infusion (</= 49m/m2), 80 mg/m2 (Treatment Plan Recorded), Intravenous, Once, BCharlaine DaltonR, MD .  sodium chloride flush (NS) 0.9 % injection 10 mL, 10 mL, Intravenous, PRN, YEarlie Server MD, 10 mL at 04/16/18 0807 .  sodium chloride flush (NS) 0.9 %  injection 10 mL, 10 mL, Intracatheter, PRN, BCammie Sickle MD   Physical exam:  Vitals:   04/16/18 0839  BP: 112/77  Pulse: 91  Resp: 14  Temp: (!) 96.7 F (35.9 C)  TempSrc: Tympanic  Weight: 166 lb (75.3 kg)  ECOG 0 Physical Exam  Constitutional: She is oriented to person, place, and time and well-developed, well-nourished, and in no distress. Vital signs are normal.  HENT:  Head: Normocephalic and atraumatic.  Eyes: Pupils are equal, round, and reactive to light.  Neck: Normal range of motion.  Cardiovascular: Normal rate, regular rhythm and normal heart sounds.  No murmur heard. Pulmonary/Chest: Effort normal and breath sounds normal. She has no wheezes.  Abdominal: Soft. Normal appearance and bowel sounds are normal. She exhibits no distension. There is no tenderness.  Musculoskeletal: Normal range of motion. She exhibits no edema.  Neurological: She is alert and oriented to person, place, and time. Gait normal.  Skin: Skin is warm and dry. No rash noted.  Psychiatric: Mood, memory, affect and judgment normal.   CMP Latest Ref Rng & Units 04/16/2018  Glucose 65 - 99 mg/dL 106(H)  BUN 6 - 20 mg/dL 11  Creatinine 0.44 - 1.00 mg/dL 0.59  Sodium 135 - 145 mmol/L 136  Potassium 3.5 - 5.1 mmol/L 3.5  Chloride 101 - 111 mmol/L 104  CO2 22 - 32 mmol/L 24  Calcium 8.9 - 10.3 mg/dL 9.1  Total Protein 6.5 - 8.1 g/dL 7.2  Total Bilirubin 0.3 - 1.2 mg/dL 0.4  Alkaline Phos 38 - 126 U/L 61  AST 15 - 41 U/L 26  ALT 14 - 54 U/L 20   CBC Latest Ref Rng & Units 04/16/2018  WBC 3.6 - 11.0 K/uL 6.8  Hemoglobin 12.0 - 16.0 g/dL 12.4  Hematocrit 35.0 - 47.0 % 35.5  Platelets 150 - 440 K/uL 247    Assessment and plan- Patient is a 37 y.o. female presents for evaluation of newly diagnosed multicentric right breast cancer.  Cancer Staging Breast cancer of upper-outer quadrant of right female breast [facility]) Staging form: Breast, AJCC 8th Edition - Clinical: G2, ER+, PR+, HER2- -  Signed by Earlie Server, MD on 04/05/2018 - Pathologic stage from 04/04/2018: No Stage Recommended (ypT3, pN2, cM0, G3, ER+, PR-, HER2-) - Signed by Earlie Server, MD on 04/04/2018     1. Malignant neoplasm of overlapping sites of right breast in female, estrogen receptor positive ([place])    Proceed with Cycle 3 today of weekly Taxol. Labs look great. No neuropathy.  Already had follow-up appt scheduled for next week.  She has evaluation for adjuvant radiation after she finishes chemotherapy.  She has a consult with Dr. Donella Stade on 04/18/18.  She has follow-up with Dr. Bary Castilla on 05/03/18.   Greater than 50% was spent in counseling and coordination of care with this patient including but not limited to discussion of the relevant topics above (See A&P) including, but not limited to diagnosis and management of acute and chronic medical conditions.   Faythe Casa, NP 04/16/2018 12:05 PM

## 2018-04-18 ENCOUNTER — Ambulatory Visit: Payer: BLUE CROSS/BLUE SHIELD | Admitting: Radiation Oncology

## 2018-04-23 ENCOUNTER — Inpatient Hospital Stay (HOSPITAL_BASED_OUTPATIENT_CLINIC_OR_DEPARTMENT_OTHER): Payer: BLUE CROSS/BLUE SHIELD | Admitting: Oncology

## 2018-04-23 ENCOUNTER — Other Ambulatory Visit: Payer: Self-pay

## 2018-04-23 ENCOUNTER — Inpatient Hospital Stay: Payer: BLUE CROSS/BLUE SHIELD

## 2018-04-23 ENCOUNTER — Encounter: Payer: Self-pay | Admitting: Oncology

## 2018-04-23 VITALS — BP 111/71 | HR 98 | Temp 96.9°F | Wt 167.5 lb

## 2018-04-23 DIAGNOSIS — Z170 Estrogen receptor positive status [ER+]: Secondary | ICD-10-CM

## 2018-04-23 DIAGNOSIS — Z9011 Acquired absence of right breast and nipple: Secondary | ICD-10-CM

## 2018-04-23 DIAGNOSIS — R000 Tachycardia, unspecified: Secondary | ICD-10-CM

## 2018-04-23 DIAGNOSIS — C50811 Malignant neoplasm of overlapping sites of right female breast: Secondary | ICD-10-CM

## 2018-04-23 DIAGNOSIS — Z780 Asymptomatic menopausal state: Secondary | ICD-10-CM

## 2018-04-23 DIAGNOSIS — R0602 Shortness of breath: Secondary | ICD-10-CM

## 2018-04-23 DIAGNOSIS — Z808 Family history of malignant neoplasm of other organs or systems: Secondary | ICD-10-CM | POA: Diagnosis not present

## 2018-04-23 DIAGNOSIS — F17210 Nicotine dependence, cigarettes, uncomplicated: Secondary | ICD-10-CM

## 2018-04-23 DIAGNOSIS — Z5111 Encounter for antineoplastic chemotherapy: Secondary | ICD-10-CM

## 2018-04-23 LAB — CBC WITH DIFFERENTIAL/PLATELET
BASOS ABS: 0 10*3/uL (ref 0–0.1)
Basophils Relative: 1 %
Eosinophils Absolute: 0.1 10*3/uL (ref 0–0.7)
Eosinophils Relative: 1 %
HCT: 34.5 % — ABNORMAL LOW (ref 35.0–47.0)
Hemoglobin: 12.1 g/dL (ref 12.0–16.0)
LYMPHS PCT: 24 %
Lymphs Abs: 1.8 10*3/uL (ref 1.0–3.6)
MCH: 30.6 pg (ref 26.0–34.0)
MCHC: 35 g/dL (ref 32.0–36.0)
MCV: 87.4 fL (ref 80.0–100.0)
MONO ABS: 0.6 10*3/uL (ref 0.2–0.9)
MONOS PCT: 7 %
Neutro Abs: 5.1 10*3/uL (ref 1.4–6.5)
Neutrophils Relative %: 67 %
PLATELETS: 266 10*3/uL (ref 150–440)
RBC: 3.95 MIL/uL (ref 3.80–5.20)
RDW: 12.9 % (ref 11.5–14.5)
WBC: 7.6 10*3/uL (ref 3.6–11.0)

## 2018-04-23 LAB — COMPREHENSIVE METABOLIC PANEL
ALBUMIN: 4.1 g/dL (ref 3.5–5.0)
ALT: 22 U/L (ref 14–54)
ANION GAP: 12 (ref 5–15)
AST: 29 U/L (ref 15–41)
Alkaline Phosphatase: 66 U/L (ref 38–126)
BUN: 15 mg/dL (ref 6–20)
CO2: 22 mmol/L (ref 22–32)
Calcium: 9 mg/dL (ref 8.9–10.3)
Chloride: 105 mmol/L (ref 101–111)
Creatinine, Ser: 0.71 mg/dL (ref 0.44–1.00)
GFR calc Af Amer: >60 mL/min (ref >60)
GFR calc non Af Amer: >60 mL/min (ref >60)
GLUCOSE: 108 mg/dL — AB (ref 65–99)
POTASSIUM: 3.5 mmol/L (ref 3.5–5.1)
SODIUM: 139 mmol/L (ref 135–145)
Total Bilirubin: 0.5 mg/dL (ref 0.3–1.2)
Total Protein: 7.2 g/dL (ref 6.5–8.1)

## 2018-04-23 LAB — TSH: TSH: 1.505 u[IU]/mL (ref 0.350–4.500)

## 2018-04-23 MED ORDER — FAMOTIDINE IN NACL 20-0.9 MG/50ML-% IV SOLN
20.0000 mg | Freq: Once | INTRAVENOUS | Status: AC
  Administered 2018-04-23: 20 mg via INTRAVENOUS
  Filled 2018-04-23: qty 50

## 2018-04-23 MED ORDER — SODIUM CHLORIDE 0.9 % IV SOLN
80.0000 mg/m2 | Freq: Once | INTRAVENOUS | Status: AC
  Administered 2018-04-23: 144 mg via INTRAVENOUS
  Filled 2018-04-23: qty 24

## 2018-04-23 MED ORDER — DIPHENHYDRAMINE HCL 50 MG/ML IJ SOLN
50.0000 mg | Freq: Once | INTRAMUSCULAR | Status: AC
  Administered 2018-04-23: 50 mg via INTRAVENOUS
  Filled 2018-04-23: qty 1

## 2018-04-23 MED ORDER — SODIUM CHLORIDE 0.9 % IV SOLN
Freq: Once | INTRAVENOUS | Status: AC
  Administered 2018-04-23: 10:00:00 via INTRAVENOUS
  Filled 2018-04-23: qty 1000

## 2018-04-23 MED ORDER — HEPARIN SOD (PORK) LOCK FLUSH 100 UNIT/ML IV SOLN
500.0000 [IU] | Freq: Once | INTRAVENOUS | Status: DC | PRN
Start: 2018-04-23 — End: 2018-04-23

## 2018-04-23 MED ORDER — SODIUM CHLORIDE 0.9% FLUSH
10.0000 mL | INTRAVENOUS | Status: DC | PRN
  Administered 2018-04-23: 10 mL via INTRAVENOUS
  Filled 2018-04-23: qty 10

## 2018-04-23 MED ORDER — DEXAMETHASONE SODIUM PHOSPHATE 100 MG/10ML IJ SOLN
20.0000 mg | Freq: Once | INTRAMUSCULAR | Status: AC
  Administered 2018-04-23: 20 mg via INTRAVENOUS
  Filled 2018-04-23: qty 2

## 2018-04-23 MED ORDER — HEPARIN SOD (PORK) LOCK FLUSH 100 UNIT/ML IV SOLN
500.0000 [IU] | Freq: Once | INTRAVENOUS | Status: AC
  Administered 2018-04-23: 500 [IU] via INTRAVENOUS
  Filled 2018-04-23: qty 5

## 2018-04-23 NOTE — Progress Notes (Signed)
Patient here today for follow up.  Patient c/o shortness of breath.  

## 2018-04-23 NOTE — Progress Notes (Signed)
Hematology/Oncology Follow Up Note [facility] Telephone:([phone] [phone]  Patient Care Team: Juluis Pitch, MD as PCP - General (Family Medicine)   Name of the patient: Theresa Chaney  [id number]  09-11-1981   Date of visit: 04/23/18 REASON FOR VISIT Follow up for treatment of breast cancer  History of presenting illness 37 year old female with past medical history listed as below presented for evaluation of newly diagnosed right breast cancer. Patient self palpated a right breast mass 2 months ago during bathing. This triggered workup including mammogram and ultrasound. 10/05/2017 mammogram diagnostic breast tomo bilaterally showed multicentric area had a suspicion of breast cancer. 1  highly suspicious irregular mass with architectural distortion in the upper right breast at posterior depth measuring approximately 2 cm greatest dimension with associated architectural distortion and pleomorphic micro-calcifications. On ultrasound of vague hypoechoic masslike areas seen in the right breast at 11:30 o'clock axis. 2 second areas irregular hypoechoic mass in the right breast at the 11:30 o'clock axis, 6 cm from the nipple, at the superficial depth measuring 1.2 cm, likely correlate for a highly suspicious 1.4 cm extent of pleomorphic and a fine linear branching calcifications. 3 additional highly suspicious pleomorphic calcifications within the upper retroareolar right breast extending to this subareolar right breast, measuring 3.7 cm extending.  Right axilla was evaluated with ultrasound showing no enlarged or morphologically abnormal lymph nodes.  Patient underwent stereotactic biopsy, on 10/11/2017. Pathology showed A, carcinoma in situ intermediate to high grade with necrosis and calcifications at the right breast 11:30. B, right breast 5:30 subareolar, invasive carcinoma with diffusely infiltrative pattern in the lymphovascular invasion. In a  background of intermediate grade carcinoma in situ with necrosis and calcifications. C right breast 12:30, posterior common invasive mammary carcinoma of her type, associated with high-grade DCIS with necrosis and calcifications. Right breast 5:30 subareolar, ER +75%, PR negative HER-2 negative. Right breast 12:30 posterior, and ER +90%, and PR negative, and HER-2 negative.  Her aunt has a history of melanoma. . She is married, and she has a Psychiatrist and a biological daughter who is 67 years old. . She reports that she has a history of heart murmur which was diagnosed by primary care physician. She's never had any other health problems.   # 03/19/2018 she underwent right mastectomy and right axillary dissection. Pathology showed invasive mammary carcinoma, 8.7 cm, grade 3,  with lymphovascular invasion, surgical margins are close but negative.  1 out of 3 sentinel lymph nodes positive, right axillary dissection showed 2 out of the 12 lymph nodes positive for macro metastasis, 1 out of 12 lymph nodes positive for micrometastasis.  Pathological staging is ypT3 ypN2 She had right immediate breast reconstruction with placement of tissue expanders.Patient also chose to have bilateral salpingo-oophorectomy.    #Genetic testing  INTERVAL HISTORY Patient presents for follow up of chemotherapy management of breast cancer. Denies any nausea vomiting, numbness/tingling.  Denies any fever, chills.  Denies any hot flushes. She mentioned her heart rate has been fast since surgery, ranging from high 90s to 100s. Also mild SOB with exertion. Denies any palpitation.   Cancer TREATMENT Neoadjuvant ddAC +1 dose of Taxol, due to lack of response, surgery was offered. Case was discussed on breast tumor board. 03/19/2018 S/p right mastectomy and right axillary dissection, immediate breast reconstruction with placement of expanders. Also had elective bilateral salpingo-oophorectomy..  # Currently on adjuvant Taxol  weekly, need to finish total of 11 cycles adjuvantly.    Review of Systems  Constitutional: Negative for chills, diaphoresis, fever, malaise/fatigue and weight loss.  HENT: Negative for congestion, ear discharge, ear pain, hearing loss, nosebleeds, sinus pain, sore throat and tinnitus.   Eyes: Negative for blurred vision, double vision, photophobia, pain, discharge and redness.  Respiratory: Negative for cough, hemoptysis, sputum production, shortness of breath and wheezing.   Cardiovascular: Negative for chest pain, palpitations, orthopnea, claudication and leg swelling.       Fast heart rate.   Gastrointestinal: Negative for abdominal pain, blood in stool, constipation, diarrhea, heartburn, melena, nausea and vomiting.  Genitourinary: Negative for dysuria, flank pain, frequency, hematuria and urgency.  Musculoskeletal: Negative for back pain, myalgias and neck pain.  Skin: Negative for itching and rash.  Neurological: Negative for dizziness, tingling, tremors, speech change, focal weakness, weakness and headaches.  Endo/Heme/Allergies: Negative for environmental allergies and polydipsia. Does not bruise/bleed easily.  Psychiatric/Behavioral: Negative for depression, hallucinations and substance abuse. The patient is not nervous/anxious.     No Known Allergies  Patient Active Problem List   Diagnosis Date Noted  . Estrogen receptor positive status (ER+) 04/04/2018  . Breast cancer of upper-outer quadrant of right female breast ([place]) 03/19/2018  . Family history of cancer   . Malignant neoplasm of overlapping sites of right breast in female, estrogen receptor positive ([place]) 10/19/2017     Past Medical History:  Diagnosis Date  . Anemia   . BRCA negative 11/26/2017  . Breast cancer ([place]) 10/11/2017   Multifocal, ER positive, PR negative, HER-2 negative.  . Chronic bronchitis ([place]) 11/2017  . COPD (chronic obstructive pulmonary disease) ([place])    MILD PER CXR  . Depression   .  Family history of cancer   . GERD (gastroesophageal reflux disease)   . Headache    MIGRAINES  . Heart murmur    ASYMPTOMATIC  . Personal history of chemotherapy    current for right breast ca     Past Surgical History:  Procedure Laterality Date  . AXILLARY LYMPH NODE DISSECTION Right 03/19/2018   Procedure: AXILLARY LYMPH NODE DISSECTION;  Surgeon: Robert Bellow, MD;  Location: [facility] ORS;  Service: General;  Laterality: Right;  . BREAST BIOPSY Right 10/11/2017   12:30 posterior coil clip invasive mammary carcinoma  . BREAST BIOPSY Right 10/11/2017   11:30 middle depth ribbon clip DCIS  . BREAST BIOPSY Right 10/11/2017   5:30 anterior depth x shape invasive ductal carcinoma  . BREAST RECONSTRUCTION WITH PLACEMENT OF TISSUE EXPANDER AND FLEX HD (ACELLULAR HYDRATED DERMIS) Right 03/19/2018   Procedure: BREAST RECONSTRUCTION WITH PLACEMENT OF TISSUE EXPANDER AND FLEX HD (ACELLULAR HYDRATED DERMIS);  Surgeon: Wallace Going, DO;  Location: [facility] ORS;  Service: Plastics;  Laterality: Right;  . LAPAROSCOPIC BILATERAL SALPINGO OOPHERECTOMY Bilateral 03/19/2018   Procedure: LAPAROSCOPIC BILATERAL SALPINGO OOPHORECTOMY;  Surgeon: Benjaman Kindler, MD;  Location: [facility] ORS;  Service: Gynecology;  Laterality: Bilateral;  . MASTECTOMY W/ SENTINEL NODE BIOPSY Right 03/19/2018   Procedure: MASTECTOMY WITH SENTINEL LYMPH NODE BIOPSY;  Surgeon: Robert Bellow, MD;  Location: [facility] ORS;  Service: General;  Laterality: Right;  . PORTACATH PLACEMENT Left 10/24/2017   Procedure: INSERTION PORT-A-CATH;  Surgeon: Robert Bellow, MD;  Location: [facility] ORS;  Service: General;  Laterality: Left;    Social History   Socioeconomic History  . Marital status: Married    Spouse name: Not on file  . Number of children: Not on file  . Years of education: Not on file  . Highest education level: Not on file  Occupational History  . Occupation: Occupational psychologist    Comment: [employer]  . Financial resource strain: Not on file  . Food insecurity:    Worry: Not on file    Inability: Not on file  . Transportation needs:    Medical: Not on file    Non-medical: Not on file  Tobacco Use  . Smoking status: Current Every Day Smoker    Packs/day: 0.50    Years: 5.00    Pack years: 2.50    Types: Cigarettes    Last attempt to quit: 12/12/2017    Years since quitting: 0.3  . Smokeless tobacco: Never Used  Substance and Sexual Activity  . Alcohol use: No    Frequency: Never  . Drug use: No  . Sexual activity: Yes    Birth control/protection: Injection  Lifestyle  . Physical activity:    Days per week: Not on file    Minutes per session: Not on file  . Stress: Not on file  Relationships  . Social connections:    Talks on phone: Not on file    Gets together: Not on file    Attends religious service: Not on file    Active member of club or organization: Not on file    Attends meetings of clubs or organizations: Not on file    Relationship status: Not on file  . Intimate partner violence:    Fear of current or ex partner: Not on file    Emotionally abused: Not on file    Physically abused: Not on file    Forced sexual activity: Not on file  Other Topics Concern  . Not on file  Social History Narrative  . Not on file     Family History  Problem Relation Age of Onset  . Melanoma Maternal Aunt        other aunts with BCC/SCC/Melanoma  . Diabetes Father   . Hypertension Father   . Hyperlipidemia Father   . Bladder Cancer Maternal Grandmother   . Cervical cancer Maternal Aunt 64       daughter w/ cervical cancer as well  . Melanoma Maternal Uncle        other uncles with BCC/SCC/Melanoma     Current Outpatient Medications:  .  albuterol (PROVENTIL HFA;VENTOLIN HFA) 108 (90 Base) MCG/ACT inhaler, Inhale 2 puffs into the lungs every 6 (six) hours as needed for wheezing or shortness of breath (Cough)., Disp: 1 Inhaler, Rfl: 2 .   ALPRAZolam (XANAX) 0.25 MG tablet, Take 0.25 mg at bedtime as needed by mouth for anxiety or sleep. , Disp: , Rfl:  .  Calcium Carb-Cholecalciferol (CALCIUM 600 + D PO), Take 1 tablet daily by mouth., Disp: , Rfl:  .  chlorhexidine (PERIDEX) 0.12 % solution, USE AS DIRECTED 15MLS IN THE MOUTH OR THROAT TWICE A DAY, Disp: 473 mL, Rfl: 1 .  doxylamine, Sleep, (UNISOM) 25 MG tablet, Take 25 mg by mouth at bedtime as needed for sleep., Disp: , Rfl:  .  escitalopram (LEXAPRO) 20 MG tablet, Take 20 mg by mouth every morning. , Disp: , Rfl:  .  esomeprazole (NEXIUM) 40 MG capsule, Take 40 mg by mouth every morning. , Disp: , Rfl:  .  ibuprofen (ADVIL,MOTRIN) 800 MG tablet, Take 1 tablet (800 mg total) by mouth every 8 (eight) hours as needed for moderate pain., Disp: 30 tablet, Rfl: 1 .  lidocaine (XYLOCAINE) 2 % solution, Use as directed  10 mLs in the mouth or throat every 4 (four) hours as needed for mouth pain., Disp: 200 mL, Rfl: 0 .  lidocaine-prilocaine (EMLA) cream, Apply to affected area once (Patient taking differently: Apply 1 application topically daily as needed (port access). ), Disp: 30 g, Rfl: 3 .  loratadine (CLARITIN) 10 MG tablet, Take 10 mg by mouth daily. , Disp: , Rfl:  .  prochlorperazine (COMPAZINE) 10 MG tablet, Take 1 tablet (10 mg total) by mouth every 6 (six) hours as needed (Nausea or vomiting)., Disp: 30 tablet, Rfl: 1 .  promethazine (PHENERGAN) 25 MG tablet, Take 1 tablet (25 mg total) by mouth every 6 (six) hours as needed for nausea or vomiting., Disp: 30 tablet, Rfl: 0 No current facility-administered medications for this visit.   Facility-Administered Medications Ordered in Other Visits:  .  heparin lock flush 100 unit/mL, 500 Units, Intravenous, Once, Earlie Server, MD .  sodium chloride flush (NS) 0.9 % injection 10 mL, 10 mL, Intravenous, PRN, Earlie Server, MD, 10 mL at 04/23/18 4403   Physical exam:  Vitals:   04/23/18 0907  BP: 111/71  Pulse: 98  Temp: (!) 96.9 F  (36.1 C)  TempSrc: Tympanic  Weight: 167 lb 8 oz (76 kg)  ECOG 0 Physical Exam  Constitutional: She is oriented to person, place, and time and well-developed, well-nourished, and in no distress. No distress.  HENT:  Head: Normocephalic and atraumatic.  Nose: Nose normal.  Mouth/Throat: Oropharynx is clear and moist. No oropharyngeal exudate.  No thrush,  Eyes: Pupils are equal, round, and reactive to light. Conjunctivae and EOM are normal. Left eye exhibits no discharge. No scleral icterus.  Neck: Normal range of motion. Neck supple. No JVD present.  Cardiovascular: Normal rate, regular rhythm and normal heart sounds.  No murmur heard. Pulmonary/Chest: Effort normal and breath sounds normal. No respiratory distress. She has no wheezes. She has no rales. She exhibits no tenderness.  Abdominal: Soft. Bowel sounds are normal. She exhibits no distension and no mass. There is no tenderness. There is no rebound.  Musculoskeletal: Normal range of motion. She exhibits no edema or tenderness.  Lymphadenopathy:    She has no cervical adenopathy.  Neurological: She is alert and oriented to person, place, and time. No cranial nerve deficit. She exhibits normal muscle tone. Coordination normal.  Skin: Skin is warm and dry. No rash noted. She is not diaphoretic. No erythema.  Psychiatric: Mood, affect and judgment normal.     CMP Latest Ref Rng & Units 04/23/2018  Glucose 65 - 99 mg/dL 108(H)  BUN 6 - 20 mg/dL 15  Creatinine 0.44 - 1.00 mg/dL 0.71  Sodium 135 - 145 mmol/L 139  Potassium 3.5 - 5.1 mmol/L 3.5  Chloride 101 - 111 mmol/L 105  CO2 22 - 32 mmol/L 22  Calcium 8.9 - 10.3 mg/dL 9.0  Total Protein 6.5 - 8.1 g/dL 7.2  Total Bilirubin 0.3 - 1.2 mg/dL 0.5  Alkaline Phos 38 - 126 U/L 66  AST 15 - 41 U/L 29  ALT 14 - 54 U/L 22   CBC Latest Ref Rng & Units 04/23/2018  WBC 3.6 - 11.0 K/uL 7.6  Hemoglobin 12.0 - 16.0 g/dL 12.1  Hematocrit 35.0 - 47.0 % 34.5(L)  Platelets 150 - 440 K/uL  266    Assessment and plan- Patient is a 37 y.o. female presents for evaluation of newly diagnosed multicentric right breast cancer.  Cancer Staging Breast cancer of upper-outer quadrant of right female breast ([place])  Staging form: Breast, AJCC 8th Edition - Clinical: G2, ER+, PR+, HER2- - Signed by Earlie Server, MD on 04/05/2018 - Pathologic stage from 04/04/2018: No Stage Recommended (ypT3, pN2, cM0, G3, ER+, PR-, HER2-) - Signed by Earlie Server, MD on 04/04/2018     1. Encounter for antineoplastic chemotherapy   2. SOB (shortness of breath) on exertion   3. Malignant neoplasm of overlapping sites of right breast in female, estrogen receptor positive ([place])   4. Estrogen receptor positive status (ER+)   5. Tachycardia   6. Menopause    # Tolerates weekly taxol very well. Proceed with today's treatment.  # Tachycardia and SOB: obtain EKG in clinic today.  EKG was independently reviewed by me and discussed with patient. She has sinus rhythm with HR of 72 today. Given her previous adiramycine exposure, will obtain 2D Echo.  Other etiology includes menopause state changes.  She has been referred to Rad onc.   # She has obtained dental clearance for starting Zometa. Rationale and side effects of Zometa including but not limited to hypocalcemia, osteonecrosis of Jaw discussed with patient. She is willing to proceed with treatment. Will schedule to be given in 1 week.   # After she finishes chemotherapy and Radiation, she maybe eligible for NATALEE trial in which she has 50% chance to be randomized to adjuvant Ribociclib with endocrine therapy. Has referred the to clinical trial coordinator   # bilateral oophorectomy, plan Aromatase inhibitor along with NATALEE trial.   Follow up in 1 week for lab and taxol treatment, follow up in 2 weeks to be assessed prior to taxol treatment.   Total face to face encounter time for this patient visit was 40 min. >50% of the time was  spent in counseling and  coordination of care.   Earlie Server, MD, PhD Hematology Oncology [facility] Pager- [phone] 04/23/18

## 2018-04-26 ENCOUNTER — Ambulatory Visit
Admission: RE | Admit: 2018-04-26 | Discharge: 2018-04-26 | Disposition: A | Discharge status: Home / Self Care | Payer: BLUE CROSS/BLUE SHIELD | Source: Ambulatory Visit | Attending: Oncology | Admitting: Oncology

## 2018-04-26 DIAGNOSIS — Z853 Personal history of malignant neoplasm of breast: Secondary | ICD-10-CM | POA: Insufficient documentation

## 2018-04-26 DIAGNOSIS — Z9221 Personal history of antineoplastic chemotherapy: Secondary | ICD-10-CM | POA: Diagnosis not present

## 2018-04-26 DIAGNOSIS — J449 Chronic obstructive pulmonary disease, unspecified: Secondary | ICD-10-CM | POA: Diagnosis not present

## 2018-04-26 DIAGNOSIS — R51 Headache: Secondary | ICD-10-CM | POA: Diagnosis not present

## 2018-04-26 DIAGNOSIS — R0602 Shortness of breath: Secondary | ICD-10-CM

## 2018-04-26 DIAGNOSIS — R011 Cardiac murmur, unspecified: Secondary | ICD-10-CM | POA: Insufficient documentation

## 2018-04-26 DIAGNOSIS — K219 Gastro-esophageal reflux disease without esophagitis: Secondary | ICD-10-CM | POA: Insufficient documentation

## 2018-04-26 DIAGNOSIS — D649 Anemia, unspecified: Secondary | ICD-10-CM | POA: Insufficient documentation

## 2018-04-26 NOTE — Progress Notes (Signed)
*  PRELIMINARY RESULTS* Echocardiogram 2D Echocardiogram has been performed.  Sherrie Sport 04/26/2018, 9:57 AM

## 2018-04-30 ENCOUNTER — Ambulatory Visit: Payer: BLUE CROSS/BLUE SHIELD | Admitting: Oncology

## 2018-04-30 ENCOUNTER — Inpatient Hospital Stay: Payer: BLUE CROSS/BLUE SHIELD

## 2018-04-30 VITALS — BP 98/63 | HR 93 | Temp 96.9°F | Resp 16 | Wt 167.0 lb

## 2018-04-30 DIAGNOSIS — C50811 Malignant neoplasm of overlapping sites of right female breast: Secondary | ICD-10-CM | POA: Diagnosis not present

## 2018-04-30 DIAGNOSIS — Z170 Estrogen receptor positive status [ER+]: Principal | ICD-10-CM

## 2018-04-30 LAB — COMPREHENSIVE METABOLIC PANEL
ALBUMIN: 4 g/dL (ref 3.5–5.0)
ALK PHOS: 71 U/L (ref 38–126)
ALT: 21 U/L (ref 14–54)
AST: 28 U/L (ref 15–41)
Anion gap: 8 (ref 5–15)
BILIRUBIN TOTAL: 0.3 mg/dL (ref 0.3–1.2)
BUN: 11 mg/dL (ref 6–20)
CALCIUM: 9 mg/dL (ref 8.9–10.3)
CO2: 23 mmol/L (ref 22–32)
Chloride: 108 mmol/L (ref 101–111)
Creatinine, Ser: 0.61 mg/dL (ref 0.44–1.00)
GFR calc Af Amer: >60 mL/min (ref >60)
GFR calc non Af Amer: >60 mL/min (ref >60)
Glucose, Bld: 112 mg/dL — ABNORMAL HIGH (ref 65–99)
POTASSIUM: 3.5 mmol/L (ref 3.5–5.1)
Sodium: 139 mmol/L (ref 135–145)
TOTAL PROTEIN: 7.1 g/dL (ref 6.5–8.1)

## 2018-04-30 LAB — CBC WITH DIFFERENTIAL/PLATELET
BASOS ABS: 0 10*3/uL (ref 0–0.1)
Basophils Relative: 1 %
EOS PCT: 1 %
Eosinophils Absolute: 0.1 10*3/uL (ref 0–0.7)
HCT: 32.9 % — ABNORMAL LOW (ref 35.0–47.0)
Hemoglobin: 11.6 g/dL — ABNORMAL LOW (ref 12.0–16.0)
LYMPHS PCT: 24 %
Lymphs Abs: 1.8 10*3/uL (ref 1.0–3.6)
MCH: 30.8 pg (ref 26.0–34.0)
MCHC: 35.4 g/dL (ref 32.0–36.0)
MCV: 86.9 fL (ref 80.0–100.0)
MONOS PCT: 8 %
Monocytes Absolute: 0.6 10*3/uL (ref 0.2–0.9)
Neutro Abs: 5.1 10*3/uL (ref 1.4–6.5)
Neutrophils Relative %: 66 %
PLATELETS: 275 10*3/uL (ref 150–440)
RBC: 3.78 MIL/uL — ABNORMAL LOW (ref 3.80–5.20)
RDW: 13.1 % (ref 11.5–14.5)
WBC: 7.6 10*3/uL (ref 3.6–11.0)

## 2018-04-30 MED ORDER — HEPARIN SOD (PORK) LOCK FLUSH 100 UNIT/ML IV SOLN: 500.0000 [IU] | Freq: Once | INTRAVENOUS | Status: DC | PRN

## 2018-04-30 MED ORDER — HEPARIN SOD (PORK) LOCK FLUSH 100 UNIT/ML IV SOLN
500.0000 [IU] | Freq: Once | INTRAVENOUS | Status: AC
  Administered 2018-04-30: 500 [IU] via INTRAVENOUS
  Filled 2018-04-30: qty 5

## 2018-04-30 MED ORDER — SODIUM CHLORIDE 0.9 % IV SOLN
Freq: Once | INTRAVENOUS | Status: AC
  Administered 2018-04-30: 09:00:00 via INTRAVENOUS
  Filled 2018-04-30: qty 1000

## 2018-04-30 MED ORDER — DIPHENHYDRAMINE HCL 50 MG/ML IJ SOLN
50.0000 mg | Freq: Once | INTRAMUSCULAR | Status: AC
  Administered 2018-04-30: 50 mg via INTRAVENOUS
  Filled 2018-04-30: qty 1

## 2018-04-30 MED ORDER — SODIUM CHLORIDE 0.9 % IV SOLN
80.0000 mg/m2 | Freq: Once | INTRAVENOUS | Status: AC
  Administered 2018-04-30: 144 mg via INTRAVENOUS
  Filled 2018-04-30: qty 24

## 2018-04-30 MED ORDER — SODIUM CHLORIDE 0.9 % IV SOLN
20.0000 mg | Freq: Once | INTRAVENOUS | Status: AC
  Administered 2018-04-30: 20 mg via INTRAVENOUS
  Filled 2018-04-30: qty 2

## 2018-04-30 MED ORDER — SODIUM CHLORIDE 0.9% FLUSH
10.0000 mL | Freq: Once | INTRAVENOUS | Status: AC
  Administered 2018-04-30: 10 mL via INTRAVENOUS
  Filled 2018-04-30: qty 10

## 2018-04-30 MED ORDER — FAMOTIDINE IN NACL 20-0.9 MG/50ML-% IV SOLN
20.0000 mg | Freq: Once | INTRAVENOUS | Status: AC
Start: 2018-04-30 — End: 2018-04-30
  Administered 2018-04-30: 20 mg via INTRAVENOUS
  Filled 2018-04-30: qty 50

## 2018-04-30 NOTE — Addendum Note (Signed)
Addended by: Earlie Server on: 04/30/2018 08:48 AM   Modules accepted: Orders

## 2018-05-03 ENCOUNTER — Ambulatory Visit: Payer: BLUE CROSS/BLUE SHIELD | Admitting: General Surgery

## 2018-05-08 ENCOUNTER — Inpatient Hospital Stay (HOSPITAL_BASED_OUTPATIENT_CLINIC_OR_DEPARTMENT_OTHER): Payer: BLUE CROSS/BLUE SHIELD | Admitting: Oncology

## 2018-05-08 ENCOUNTER — Inpatient Hospital Stay: Payer: BLUE CROSS/BLUE SHIELD

## 2018-05-08 ENCOUNTER — Encounter: Payer: Self-pay | Admitting: Oncology

## 2018-05-08 ENCOUNTER — Telehealth: Payer: Self-pay

## 2018-05-08 VITALS — BP 111/68 | HR 111 | Temp 97.2°F | Resp 18 | Ht 67.0 in | Wt 167.9 lb

## 2018-05-08 DIAGNOSIS — Z170 Estrogen receptor positive status [ER+]: Principal | ICD-10-CM

## 2018-05-08 DIAGNOSIS — R000 Tachycardia, unspecified: Secondary | ICD-10-CM

## 2018-05-08 DIAGNOSIS — D6481 Anemia due to antineoplastic chemotherapy: Secondary | ICD-10-CM

## 2018-05-08 DIAGNOSIS — T451X5A Adverse effect of antineoplastic and immunosuppressive drugs, initial encounter: Secondary | ICD-10-CM

## 2018-05-08 DIAGNOSIS — C50811 Malignant neoplasm of overlapping sites of right female breast: Secondary | ICD-10-CM

## 2018-05-08 DIAGNOSIS — F17210 Nicotine dependence, cigarettes, uncomplicated: Secondary | ICD-10-CM

## 2018-05-08 DIAGNOSIS — Z9011 Acquired absence of right breast and nipple: Secondary | ICD-10-CM | POA: Diagnosis not present

## 2018-05-08 DIAGNOSIS — R0602 Shortness of breath: Secondary | ICD-10-CM | POA: Diagnosis not present

## 2018-05-08 DIAGNOSIS — Z809 Family history of malignant neoplasm, unspecified: Secondary | ICD-10-CM | POA: Diagnosis not present

## 2018-05-08 DIAGNOSIS — Z90722 Acquired absence of ovaries, bilateral: Secondary | ICD-10-CM

## 2018-05-08 DIAGNOSIS — Z5111 Encounter for antineoplastic chemotherapy: Secondary | ICD-10-CM

## 2018-05-08 LAB — COMPREHENSIVE METABOLIC PANEL
ALBUMIN: 4.1 g/dL (ref 3.5–5.0)
ALK PHOS: 72 U/L (ref 38–126)
ALT: 22 U/L (ref 14–54)
ANION GAP: 9 (ref 5–15)
AST: 31 U/L (ref 15–41)
BUN: 12 mg/dL (ref 6–20)
CHLORIDE: 106 mmol/L (ref 101–111)
CO2: 22 mmol/L (ref 22–32)
Calcium: 9.1 mg/dL (ref 8.9–10.3)
Creatinine, Ser: 0.69 mg/dL (ref 0.44–1.00)
GFR calc non Af Amer: >60 mL/min (ref >60)
GLUCOSE: 140 mg/dL — AB (ref 65–99)
POTASSIUM: 3.2 mmol/L — AB (ref 3.5–5.1)
SODIUM: 137 mmol/L (ref 135–145)
Total Bilirubin: 0.3 mg/dL (ref 0.3–1.2)
Total Protein: 7 g/dL (ref 6.5–8.1)

## 2018-05-08 LAB — CBC WITH DIFFERENTIAL/PLATELET
BASOS PCT: 1 %
Basophils Absolute: 0 10*3/uL (ref 0–0.1)
Eosinophils Absolute: 0.1 10*3/uL (ref 0–0.7)
Eosinophils Relative: 1 %
HEMATOCRIT: 32.6 % — AB (ref 35.0–47.0)
HEMOGLOBIN: 11.4 g/dL — AB (ref 12.0–16.0)
LYMPHS ABS: 1.8 10*3/uL (ref 1.0–3.6)
LYMPHS PCT: 27 %
MCH: 30.6 pg (ref 26.0–34.0)
MCHC: 34.9 g/dL (ref 32.0–36.0)
MCV: 87.7 fL (ref 80.0–100.0)
MONO ABS: 0.6 10*3/uL (ref 0.2–0.9)
MONOS PCT: 9 %
NEUTROS ABS: 4.3 10*3/uL (ref 1.4–6.5)
NEUTROS PCT: 62 %
Platelets: 280 10*3/uL (ref 150–440)
RBC: 3.72 MIL/uL — ABNORMAL LOW (ref 3.80–5.20)
RDW: 13.5 % (ref 11.5–14.5)
WBC: 6.8 10*3/uL (ref 3.6–11.0)

## 2018-05-08 MED ORDER — HEPARIN SOD (PORK) LOCK FLUSH 100 UNIT/ML IV SOLN
500.0000 [IU] | Freq: Once | INTRAVENOUS | Status: AC
  Administered 2018-05-08: 500 [IU] via INTRAVENOUS
  Filled 2018-05-08: qty 5

## 2018-05-08 MED ORDER — SODIUM CHLORIDE 0.9 % IV SOLN
20.0000 mg | Freq: Once | INTRAVENOUS | Status: AC
  Administered 2018-05-08: 20 mg via INTRAVENOUS
  Filled 2018-05-08: qty 2

## 2018-05-08 MED ORDER — DIPHENHYDRAMINE HCL 50 MG/ML IJ SOLN
50.0000 mg | Freq: Once | INTRAMUSCULAR | Status: AC
  Administered 2018-05-08: 50 mg via INTRAVENOUS
  Filled 2018-05-08: qty 1

## 2018-05-08 MED ORDER — SODIUM CHLORIDE 0.9 % IV SOLN
80.0000 mg/m2 | Freq: Once | INTRAVENOUS | Status: AC
  Administered 2018-05-08: 144 mg via INTRAVENOUS
  Filled 2018-05-08: qty 24

## 2018-05-08 MED ORDER — SODIUM CHLORIDE 0.9% FLUSH
10.0000 mL | Freq: Once | INTRAVENOUS | Status: AC
  Administered 2018-05-08: 10 mL via INTRAVENOUS
  Filled 2018-05-08: qty 10

## 2018-05-08 MED ORDER — SODIUM CHLORIDE 0.9 % IV SOLN
Freq: Once | INTRAVENOUS | Status: AC
  Administered 2018-05-08: 11:00:00 via INTRAVENOUS
  Filled 2018-05-08: qty 1000

## 2018-05-08 MED ORDER — FAMOTIDINE IN NACL 20-0.9 MG/50ML-% IV SOLN
20.0000 mg | Freq: Once | INTRAVENOUS | Status: AC
  Administered 2018-05-08: 20 mg via INTRAVENOUS
  Filled 2018-05-08: qty 50

## 2018-05-08 NOTE — Telephone Encounter (Signed)
Left message on Theresa Chaney v/m to inform her of her Cardiologist appointment 07/11/18 at 9:20AM. I have also informed the patient if she have any question or concerns please feel free to called the office for further information.

## 2018-05-08 NOTE — Progress Notes (Signed)
Hematology/Oncology Follow Up Note [facility] Telephone:([phone] [phone]  Patient Care Team: Juluis Pitch, MD as PCP - General (Family Medicine)   Name of the patient: Theresa Chaney  [id number]  1981/12/09   Date of visit: 05/08/18 REASON FOR VISIT Follow up for treatment of breast cancer  History of presenting illness 37 year old female with past medical history listed as below presented for evaluation of newly diagnosed right breast cancer. Patient self palpated a right breast mass 2 months ago during bathing. This triggered workup including mammogram and ultrasound. 10/05/2017 mammogram diagnostic breast tomo bilaterally showed multicentric area had a suspicion of breast cancer. 1  highly suspicious irregular mass with architectural distortion in the upper right breast at posterior depth measuring approximately 2 cm greatest dimension with associated architectural distortion and pleomorphic micro-calcifications. On ultrasound of vague hypoechoic masslike areas seen in the right breast at 11:30 o'clock axis. 2 second areas irregular hypoechoic mass in the right breast at the 11:30 o'clock axis, 6 cm from the nipple, at the superficial depth measuring 1.2 cm, likely correlate for a highly suspicious 1.4 cm extent of pleomorphic and a fine linear branching calcifications. 3 additional highly suspicious pleomorphic calcifications within the upper retroareolar right breast extending to this subareolar right breast, measuring 3.7 cm extending.  Right axilla was evaluated with ultrasound showing no enlarged or morphologically abnormal lymph nodes.  Patient underwent stereotactic biopsy, on 10/11/2017. Pathology showed A, carcinoma in situ intermediate to high grade with necrosis and calcifications at the right breast 11:30. B, right breast 5:30 subareolar, invasive carcinoma with diffusely infiltrative pattern in the lymphovascular invasion. In a  background of intermediate grade carcinoma in situ with necrosis and calcifications. C right breast 12:30, posterior common invasive mammary carcinoma of her type, associated with high-grade DCIS with necrosis and calcifications. Right breast 5:30 subareolar, ER +75%, PR negative HER-2 negative. Right breast 12:30 posterior, and ER +90%, and PR negative, and HER-2 negative.  Her aunt has a history of melanoma. . She is married, and she has a Psychiatrist and a biological daughter who is 81 years old. . She reports that she has a history of heart murmur which was diagnosed by primary care physician. She's never had any other health problems.   # 03/19/2018 she underwent right mastectomy and right axillary dissection. Pathology showed invasive mammary carcinoma, 8.7 cm, grade 3,  with lymphovascular invasion, surgical margins are close but negative.  1 out of 3 sentinel lymph nodes positive, right axillary dissection showed 2 out of the 12 lymph nodes positive for macro metastasis, 1 out of 12 lymph nodes positive for micrometastasis.  Pathological staging is ypT3 ypN2 She had right immediate breast reconstruction with placement of tissue expanders.Patient also chose to have bilateral salpingo-oophorectomy.    #Genetic testing Testing did not reveal any pathogenic mutation in any of these genes. A copy of the genetic test report will be scanned into Epic under the media tab. The genes analyzed were the 47 genes on [employer]'s Common Cancers panel (APC, ATM, AXIN2, BARD1, BMPR1A, BRCA1, BRCA2, BRIP1, CDH1, CDK4, CDKN2A, CHEK2, CTNNA1, DICER1, EPCAM, GREM1, HOXB13, KIT, MEN1, MLH1, MSH2, MSH3, MSH6, MUTYH, NBN, NF1, NTHL1, PALB2, PDGFRA, PMS2, POLD1, POLE, PTEN, RAD50, RAD51C, RAD51D, SDHA, SDHB, SDHC, SDHD, SMAD4, SMARCA4, STK11, TP53, TSC1, TSC2, VHL).    INTERVAL HISTORY Patient presents for follow up of chemotherapy management of breast cancer she tolerates weekly Taxol well.  Denies any nausea  vomiting, numbness or tingling.  Denies fever or  chills. She remains to be tachycardic, intermittently feels palpitation.  She has had EKG done in the clinic which showed sinus tachycardia.  Obtained 2D echo which showed normal LVEF. Continue to have shortness of breath with exertion.   Cancer TREATMENT Neoadjuvant ddAC +1 dose of Taxol, due to lack of response, surgery was offered. Case was discussed on breast tumor board. 03/19/2018 S/p right mastectomy and right axillary dissection, immediate breast reconstruction with placement of expanders. Also had elective bilateral salpingo-oophorectomy..  # Currently on adjuvant Taxol weekly, need to finish total of 11 cycles adjuvantly.    Review of Systems  Constitutional: Negative for chills, diaphoresis, fever, malaise/fatigue and weight loss.  HENT: Negative for congestion, ear discharge, ear pain, hearing loss, nosebleeds, sinus pain, sore throat and tinnitus.   Eyes: Negative for blurred vision, double vision, photophobia, pain, discharge and redness.  Respiratory: Negative for cough, hemoptysis, sputum production, shortness of breath and wheezing.   Cardiovascular: Positive for palpitations. Negative for chest pain, orthopnea, claudication and leg swelling.       Fast heart rate.   Gastrointestinal: Negative for abdominal pain, blood in stool, constipation, diarrhea, heartburn, melena, nausea and vomiting.  Genitourinary: Negative for dysuria, flank pain, frequency, hematuria and urgency.  Musculoskeletal: Negative for back pain, myalgias and neck pain.  Skin: Negative for itching and rash.  Neurological: Negative for dizziness, tingling, tremors, speech change, focal weakness, weakness and headaches.  Endo/Heme/Allergies: Negative for environmental allergies and polydipsia. Does not bruise/bleed easily.  Psychiatric/Behavioral: Negative for depression, hallucinations and substance abuse. The patient is not nervous/anxious.     No Known  Allergies  Patient Active Problem List   Diagnosis Date Noted  . Estrogen receptor positive status (ER+) 04/04/2018  . Breast cancer of upper-outer quadrant of right female breast ([place]) 03/19/2018  . Family history of cancer   . Malignant neoplasm of overlapping sites of right breast in female, estrogen receptor positive ([place]) 10/19/2017     Past Medical History:  Diagnosis Date  . Anemia   . BRCA negative 11/26/2017  . Breast cancer ([place]) 10/11/2017   Multifocal, ER positive, PR negative, HER-2 negative.  . Chronic bronchitis ([place]) 11/2017  . COPD (chronic obstructive pulmonary disease) ([place])    MILD PER CXR  . Depression   . Family history of cancer   . GERD (gastroesophageal reflux disease)   . Headache    MIGRAINES  . Heart murmur    ASYMPTOMATIC  . Personal history of chemotherapy    current for right breast ca     Past Surgical History:  Procedure Laterality Date  . AXILLARY LYMPH NODE DISSECTION Right 03/19/2018   Procedure: AXILLARY LYMPH NODE DISSECTION;  Surgeon: Robert Bellow, MD;  Location: [facility] ORS;  Service: General;  Laterality: Right;  . BREAST BIOPSY Right 10/11/2017   12:30 posterior coil clip invasive mammary carcinoma  . BREAST BIOPSY Right 10/11/2017   11:30 middle depth ribbon clip DCIS  . BREAST BIOPSY Right 10/11/2017   5:30 anterior depth x shape invasive ductal carcinoma  . BREAST RECONSTRUCTION WITH PLACEMENT OF TISSUE EXPANDER AND FLEX HD (ACELLULAR HYDRATED DERMIS) Right 03/19/2018   Procedure: BREAST RECONSTRUCTION WITH PLACEMENT OF TISSUE EXPANDER AND FLEX HD (ACELLULAR HYDRATED DERMIS);  Surgeon: Wallace Going, DO;  Location: [facility] ORS;  Service: Plastics;  Laterality: Right;  . LAPAROSCOPIC BILATERAL SALPINGO OOPHERECTOMY Bilateral 03/19/2018   Procedure: LAPAROSCOPIC BILATERAL SALPINGO OOPHORECTOMY;  Surgeon: Benjaman Kindler, MD;  Location: [facility] ORS;  Service: Gynecology;  Laterality: Bilateral;  .  MASTECTOMY W/ SENTINEL NODE  BIOPSY Right 03/19/2018   Procedure: MASTECTOMY WITH SENTINEL LYMPH NODE BIOPSY;  Surgeon: Robert Bellow, MD;  Location: [facility] ORS;  Service: General;  Laterality: Right;  . PORTACATH PLACEMENT Left 10/24/2017   Procedure: INSERTION PORT-A-CATH;  Surgeon: Robert Bellow, MD;  Location: [facility] ORS;  Service: General;  Laterality: Left;    Social History   Socioeconomic History  . Marital status: Married    Spouse name: Not on file  . Number of children: Not on file  . Years of education: Not on file  . Highest education level: Not on file  Occupational History  . Occupation: Occupational psychologist    Comment: [employer]  . Financial resource strain: Not on file  . Food insecurity:    Worry: Not on file    Inability: Not on file  . Transportation needs:    Medical: Not on file    Non-medical: Not on file  Tobacco Use  . Smoking status: Current Every Day Smoker    Packs/day: 0.50    Years: 5.00    Pack years: 2.50    Types: Cigarettes    Last attempt to quit: 12/12/2017    Years since quitting: 0.4  . Smokeless tobacco: Never Used  Substance and Sexual Activity  . Alcohol use: No    Frequency: Never  . Drug use: No  . Sexual activity: Yes    Birth control/protection: Injection  Lifestyle  . Physical activity:    Days per week: Not on file    Minutes per session: Not on file  . Stress: Not on file  Relationships  . Social connections:    Talks on phone: Not on file    Gets together: Not on file    Attends religious service: Not on file    Active member of club or organization: Not on file    Attends meetings of clubs or organizations: Not on file    Relationship status: Not on file  . Intimate partner violence:    Fear of current or ex partner: Not on file    Emotionally abused: Not on file    Physically abused: Not on file    Forced sexual activity: Not on file  Other Topics Concern  . Not on file  Social History Narrative    . Not on file     Family History  Problem Relation Age of Onset  . Melanoma Maternal Aunt        other aunts with BCC/SCC/Melanoma  . Diabetes Father   . Hypertension Father   . Hyperlipidemia Father   . Bladder Cancer Maternal Grandmother   . Cervical cancer Maternal Aunt 64       daughter w/ cervical cancer as well  . Melanoma Maternal Uncle        other uncles with BCC/SCC/Melanoma     Current Outpatient Medications:  .  Calcium Carb-Cholecalciferol (CALCIUM 600 + D PO), Take 1 tablet daily by mouth., Disp: , Rfl:  .  doxylamine, Sleep, (UNISOM) 25 MG tablet, Take 25 mg by mouth at bedtime as needed for sleep., Disp: , Rfl:  .  escitalopram (LEXAPRO) 20 MG tablet, Take 20 mg by mouth every morning. , Disp: , Rfl:  .  esomeprazole (NEXIUM) 40 MG capsule, Take 40 mg by mouth every morning. , Disp: , Rfl:  .  loratadine (CLARITIN) 10 MG tablet, Take 10 mg by mouth daily. , Disp: ,  Rfl:  .  albuterol (PROVENTIL HFA;VENTOLIN HFA) 108 (90 Base) MCG/ACT inhaler, Inhale 2 puffs into the lungs every 6 (six) hours as needed for wheezing or shortness of breath (Cough). (Patient not taking: Reported on 05/08/2018), Disp: 1 Inhaler, Rfl: 2 .  ALPRAZolam (XANAX) 0.25 MG tablet, Take 0.25 mg at bedtime as needed by mouth for anxiety or sleep. , Disp: , Rfl:  .  chlorhexidine (PERIDEX) 0.12 % solution, USE AS DIRECTED 15MLS IN THE MOUTH OR THROAT TWICE A DAY (Patient not taking: Reported on 05/08/2018), Disp: 473 mL, Rfl: 1 .  ibuprofen (ADVIL,MOTRIN) 800 MG tablet, Take 1 tablet (800 mg total) by mouth every 8 (eight) hours as needed for moderate pain. (Patient not taking: Reported on 05/08/2018), Disp: 30 tablet, Rfl: 1 .  lidocaine (XYLOCAINE) 2 % solution, Use as directed 10 mLs in the mouth or throat every 4 (four) hours as needed for mouth pain. (Patient not taking: Reported on 05/08/2018), Disp: 200 mL, Rfl: 0 .  lidocaine-prilocaine (EMLA) cream, Apply to affected area once (Patient not taking:  Reported on 05/08/2018), Disp: 30 g, Rfl: 3 .  prochlorperazine (COMPAZINE) 10 MG tablet, Take 1 tablet (10 mg total) by mouth every 6 (six) hours as needed (Nausea or vomiting). (Patient not taking: Reported on 05/08/2018), Disp: 30 tablet, Rfl: 1 .  promethazine (PHENERGAN) 25 MG tablet, Take 1 tablet (25 mg total) by mouth every 6 (six) hours as needed for nausea or vomiting. (Patient not taking: Reported on 05/08/2018), Disp: 30 tablet, Rfl: 0   Physical exam:  Vitals:   05/08/18 1014  BP: 111/68  Pulse: (!) 111  Resp: 18  Temp: (!) 97.2 F (36.2 C)  TempSrc: Tympanic  SpO2: 98%  Weight: 167 lb 14.4 oz (76.2 kg)  Height: 5' 7"  (1.702 m)  ECOG 0 Physical Exam  Constitutional: She is oriented to person, place, and time and well-developed, well-nourished, and in no distress. No distress.  HENT:  Head: Normocephalic and atraumatic.  Nose: Nose normal.  Mouth/Throat: Oropharynx is clear and moist. No oropharyngeal exudate.  No thrush,  Eyes: Pupils are equal, round, and reactive to light. Conjunctivae and EOM are normal. Left eye exhibits no discharge. No scleral icterus.  Neck: Normal range of motion. Neck supple. No JVD present.  Cardiovascular: Regular rhythm and normal heart sounds.  No murmur heard. Tachycardia  Pulmonary/Chest: Effort normal and breath sounds normal. No respiratory distress. She has no wheezes. She has no rales. She exhibits no tenderness.  Abdominal: Soft. Bowel sounds are normal. She exhibits no distension and no mass. There is no tenderness. There is no rebound.  Musculoskeletal: Normal range of motion. She exhibits no edema or tenderness.  Lymphadenopathy:    She has no cervical adenopathy.  Neurological: She is alert and oriented to person, place, and time. No cranial nerve deficit. She exhibits normal muscle tone. Coordination normal.  Skin: Skin is warm and dry. No rash noted. She is not diaphoretic. No erythema.  Psychiatric: Mood, affect and judgment  normal.     CMP Latest Ref Rng & Units 05/08/2018  Glucose 65 - 99 mg/dL 140(H)  BUN 6 - 20 mg/dL 12  Creatinine 0.44 - 1.00 mg/dL 0.69  Sodium 135 - 145 mmol/L 137  Potassium 3.5 - 5.1 mmol/L 3.2(L)  Chloride 101 - 111 mmol/L 106  CO2 22 - 32 mmol/L 22  Calcium 8.9 - 10.3 mg/dL 9.1  Total Protein 6.5 - 8.1 g/dL 7.0  Total Bilirubin 0.3 - 1.2  mg/dL 0.3  Alkaline Phos 38 - 126 U/L 72  AST 15 - 41 U/L 31  ALT 14 - 54 U/L 22   CBC Latest Ref Rng & Units 05/08/2018  WBC 3.6 - 11.0 K/uL 6.8  Hemoglobin 12.0 - 16.0 g/dL 11.4(L)  Hematocrit 35.0 - 47.0 % 32.6(L)  Platelets 150 - 440 K/uL 280    Assessment and plan- Patient is a 37 y.o. female presents for evaluation of newly diagnosed multicentric right breast cancer.  Cancer Staging Breast cancer of upper-outer quadrant of right female breast [facility]) Staging form: Breast, AJCC 8th Edition - Clinical: G2, ER+, PR+, HER2- - Signed by Earlie Server, MD on 04/05/2018 - Pathologic stage from 04/04/2018: No Stage Recommended (ypT3, pN2, cM0, G3, ER+, PR-, HER2-) - Signed by Earlie Server, MD on 04/04/2018     1. Malignant neoplasm of overlapping sites of right breast in female, estrogen receptor positive ([place])   2. Encounter for antineoplastic chemotherapy   3. Anemia due to antineoplastic chemotherapy    #Breast cancer, currently on weekly Taxol.  Tolerates very well.  Proceed with today's Taxol treatment. She has been referred to Rad onc.  Plan adjuvant radiation after she finished Taxol.  Eventually she will be started on aromatase inhibitor.  # Tachycardia and SOB: EKG and 2D echo results reviewed with patient.  TSH was normal. Although work-up is negative, given her previous exposure of Adriamycin, I refer her to see cardiologist. Other etiology includes menopause state changes.   # She has obtained dental clearance for starting Zometa.  Plan Zometa in 1 week. Recommend patient to continue take over-the-counter calcium and vitamin D  supplementation. # After she finishes chemotherapy and Radiation, she maybe eligible for NATALEE trial in which she has 50% chance to be randomized to adjuvant Ribociclib with endocrine therapy.  I have previously referred patient to clinical trial coordinator.   # bilateral oophorectomy, plan Aromatase inhibitor along with NATALEE trial.   Follow up in 1 week for lab and taxol treatment, follow up in 2 weeks to be assessed prior to taxol treatment.   Earlie Server, MD, PhD Hematology Oncology [facility] Pager- [phone] 05/08/18

## 2018-05-08 NOTE — Progress Notes (Signed)
No new changes noted today 

## 2018-05-14 ENCOUNTER — Inpatient Hospital Stay: Payer: BLUE CROSS/BLUE SHIELD

## 2018-05-14 ENCOUNTER — Inpatient Hospital Stay: Payer: BLUE CROSS/BLUE SHIELD | Attending: Oncology

## 2018-05-14 VITALS — BP 107/68 | HR 84 | Resp 20

## 2018-05-14 DIAGNOSIS — C50411 Malignant neoplasm of upper-outer quadrant of right female breast: Secondary | ICD-10-CM

## 2018-05-14 DIAGNOSIS — C50811 Malignant neoplasm of overlapping sites of right female breast: Secondary | ICD-10-CM

## 2018-05-14 DIAGNOSIS — Z5111 Encounter for antineoplastic chemotherapy: Secondary | ICD-10-CM | POA: Insufficient documentation

## 2018-05-14 DIAGNOSIS — Z170 Estrogen receptor positive status [ER+]: Secondary | ICD-10-CM | POA: Diagnosis not present

## 2018-05-14 LAB — CBC WITH DIFFERENTIAL/PLATELET
BASOS PCT: 0 %
Basophils Absolute: 0 10*3/uL (ref 0–0.1)
EOS ABS: 0 10*3/uL (ref 0–0.7)
EOS PCT: 1 %
HCT: 31.9 % — ABNORMAL LOW (ref 35.0–47.0)
HEMOGLOBIN: 11.1 g/dL — AB (ref 12.0–16.0)
LYMPHS ABS: 1.7 10*3/uL (ref 1.0–3.6)
Lymphocytes Relative: 24 %
MCH: 30.5 pg (ref 26.0–34.0)
MCHC: 35 g/dL (ref 32.0–36.0)
MCV: 87.4 fL (ref 80.0–100.0)
MONO ABS: 0.5 10*3/uL (ref 0.2–0.9)
MONOS PCT: 7 %
Neutro Abs: 4.8 10*3/uL (ref 1.4–6.5)
Neutrophils Relative %: 68 %
PLATELETS: 257 10*3/uL (ref 150–440)
RBC: 3.65 MIL/uL — ABNORMAL LOW (ref 3.80–5.20)
RDW: 14.3 % (ref 11.5–14.5)
WBC: 7.1 10*3/uL (ref 3.6–11.0)

## 2018-05-14 LAB — COMPREHENSIVE METABOLIC PANEL
ALK PHOS: 72 U/L (ref 38–126)
ALT: 20 U/L (ref 14–54)
AST: 27 U/L (ref 15–41)
Albumin: 4.1 g/dL (ref 3.5–5.0)
Anion gap: 7 (ref 5–15)
BUN: 9 mg/dL (ref 6–20)
CALCIUM: 9.1 mg/dL (ref 8.9–10.3)
CHLORIDE: 107 mmol/L (ref 101–111)
CO2: 23 mmol/L (ref 22–32)
CREATININE: 0.51 mg/dL (ref 0.44–1.00)
GFR calc Af Amer: >60 mL/min (ref >60)
GFR calc non Af Amer: >60 mL/min (ref >60)
Glucose, Bld: 122 mg/dL — ABNORMAL HIGH (ref 65–99)
Potassium: 3.7 mmol/L (ref 3.5–5.1)
SODIUM: 137 mmol/L (ref 135–145)
Total Bilirubin: 0.5 mg/dL (ref 0.3–1.2)
Total Protein: 6.9 g/dL (ref 6.5–8.1)

## 2018-05-14 MED ORDER — SODIUM CHLORIDE 0.9 % IV SOLN
80.0000 mg/m2 | Freq: Once | INTRAVENOUS | Status: AC
  Administered 2018-05-14: 144 mg via INTRAVENOUS
  Filled 2018-05-14: qty 24

## 2018-05-14 MED ORDER — ZOLEDRONIC ACID 4 MG/100ML IV SOLN
4.0000 mg | Freq: Once | INTRAVENOUS | Status: AC
  Administered 2018-05-14: 4 mg via INTRAVENOUS
  Filled 2018-05-14: qty 100

## 2018-05-14 MED ORDER — SODIUM CHLORIDE 0.9 % IV SOLN
Freq: Once | INTRAVENOUS | Status: AC
  Administered 2018-05-14: 11:00:00 via INTRAVENOUS
  Filled 2018-05-14: qty 1000

## 2018-05-14 MED ORDER — FAMOTIDINE IN NACL 20-0.9 MG/50ML-% IV SOLN
20.0000 mg | Freq: Once | INTRAVENOUS | Status: AC
  Administered 2018-05-14: 20 mg via INTRAVENOUS
  Filled 2018-05-14: qty 50

## 2018-05-14 MED ORDER — HEPARIN SOD (PORK) LOCK FLUSH 100 UNIT/ML IV SOLN
500.0000 [IU] | Freq: Once | INTRAVENOUS | Status: AC | PRN
  Administered 2018-05-14: 500 [IU]

## 2018-05-14 MED ORDER — DIPHENHYDRAMINE HCL 50 MG/ML IJ SOLN
50.0000 mg | Freq: Once | INTRAMUSCULAR | Status: AC
  Administered 2018-05-14: 50 mg via INTRAVENOUS
  Filled 2018-05-14: qty 1

## 2018-05-14 MED ORDER — SODIUM CHLORIDE 0.9 % IV SOLN
20.0000 mg | Freq: Once | INTRAVENOUS | Status: AC
  Administered 2018-05-14: 20 mg via INTRAVENOUS
  Filled 2018-05-14: qty 2

## 2018-05-14 MED ORDER — SODIUM CHLORIDE 0.9% FLUSH
10.0000 mL | Freq: Once | INTRAVENOUS | Status: AC
  Administered 2018-05-14: 10 mL via INTRAVENOUS
  Filled 2018-05-14: qty 10

## 2018-05-14 MED ORDER — HEPARIN SOD (PORK) LOCK FLUSH 100 UNIT/ML IV SOLN
500.0000 [IU] | Freq: Once | INTRAVENOUS | Status: DC
  Filled 2018-05-14: qty 5

## 2018-05-21 ENCOUNTER — Other Ambulatory Visit: Payer: Self-pay

## 2018-05-21 ENCOUNTER — Inpatient Hospital Stay: Payer: BLUE CROSS/BLUE SHIELD

## 2018-05-21 ENCOUNTER — Inpatient Hospital Stay (HOSPITAL_BASED_OUTPATIENT_CLINIC_OR_DEPARTMENT_OTHER): Payer: BLUE CROSS/BLUE SHIELD | Admitting: Oncology

## 2018-05-21 ENCOUNTER — Encounter: Payer: Self-pay | Admitting: Oncology

## 2018-05-21 VITALS — BP 118/73 | HR 102 | Temp 96.8°F | Resp 18 | Wt 169.9 lb

## 2018-05-21 DIAGNOSIS — Z5111 Encounter for antineoplastic chemotherapy: Secondary | ICD-10-CM | POA: Diagnosis not present

## 2018-05-21 DIAGNOSIS — F17210 Nicotine dependence, cigarettes, uncomplicated: Secondary | ICD-10-CM

## 2018-05-21 DIAGNOSIS — C50411 Malignant neoplasm of upper-outer quadrant of right female breast: Secondary | ICD-10-CM

## 2018-05-21 DIAGNOSIS — Z170 Estrogen receptor positive status [ER+]: Secondary | ICD-10-CM | POA: Diagnosis not present

## 2018-05-21 DIAGNOSIS — Z9011 Acquired absence of right breast and nipple: Secondary | ICD-10-CM | POA: Diagnosis not present

## 2018-05-21 DIAGNOSIS — R000 Tachycardia, unspecified: Secondary | ICD-10-CM | POA: Diagnosis not present

## 2018-05-21 DIAGNOSIS — T451X5A Adverse effect of antineoplastic and immunosuppressive drugs, initial encounter: Secondary | ICD-10-CM

## 2018-05-21 DIAGNOSIS — D6481 Anemia due to antineoplastic chemotherapy: Secondary | ICD-10-CM

## 2018-05-21 DIAGNOSIS — C50811 Malignant neoplasm of overlapping sites of right female breast: Secondary | ICD-10-CM

## 2018-05-21 DIAGNOSIS — R5383 Other fatigue: Secondary | ICD-10-CM | POA: Diagnosis not present

## 2018-05-21 LAB — CBC WITH DIFFERENTIAL/PLATELET
Basophils Absolute: 0 10*3/uL (ref 0–0.1)
Basophils Relative: 0 %
EOS ABS: 0.1 10*3/uL (ref 0–0.7)
EOS PCT: 1 %
HCT: 32.2 % — ABNORMAL LOW (ref 35.0–47.0)
Hemoglobin: 11.4 g/dL — ABNORMAL LOW (ref 12.0–16.0)
LYMPHS ABS: 1.7 10*3/uL (ref 1.0–3.6)
Lymphocytes Relative: 20 %
MCH: 31.3 pg (ref 26.0–34.0)
MCHC: 35.5 g/dL (ref 32.0–36.0)
MCV: 88.1 fL (ref 80.0–100.0)
MONO ABS: 0.8 10*3/uL (ref 0.2–0.9)
MONOS PCT: 9 %
Neutro Abs: 5.9 10*3/uL (ref 1.4–6.5)
Neutrophils Relative %: 70 %
Platelets: 313 10*3/uL (ref 150–440)
RBC: 3.66 MIL/uL — ABNORMAL LOW (ref 3.80–5.20)
RDW: 15 % — AB (ref 11.5–14.5)
WBC: 8.6 10*3/uL (ref 3.6–11.0)

## 2018-05-21 LAB — COMPREHENSIVE METABOLIC PANEL
ALK PHOS: 82 U/L (ref 38–126)
ALT: 29 U/L (ref 14–54)
AST: 37 U/L (ref 15–41)
Albumin: 4.1 g/dL (ref 3.5–5.0)
Anion gap: 8 (ref 5–15)
BUN: 14 mg/dL (ref 6–20)
CALCIUM: 9.3 mg/dL (ref 8.9–10.3)
CHLORIDE: 106 mmol/L (ref 101–111)
CO2: 23 mmol/L (ref 22–32)
CREATININE: 0.64 mg/dL (ref 0.44–1.00)
GFR calc Af Amer: >60 mL/min (ref >60)
GFR calc non Af Amer: >60 mL/min (ref >60)
GLUCOSE: 140 mg/dL — AB (ref 65–99)
Potassium: 3.8 mmol/L (ref 3.5–5.1)
SODIUM: 137 mmol/L (ref 135–145)
Total Bilirubin: 0.6 mg/dL (ref 0.3–1.2)
Total Protein: 7.2 g/dL (ref 6.5–8.1)

## 2018-05-21 MED ORDER — HEPARIN SOD (PORK) LOCK FLUSH 100 UNIT/ML IV SOLN
500.0000 [IU] | Freq: Once | INTRAVENOUS | Status: AC
  Administered 2018-05-21: 500 [IU] via INTRAVENOUS
  Filled 2018-05-21: qty 5

## 2018-05-21 MED ORDER — DIPHENHYDRAMINE HCL 50 MG/ML IJ SOLN
50.0000 mg | Freq: Once | INTRAMUSCULAR | Status: AC
  Administered 2018-05-21: 50 mg via INTRAVENOUS
  Filled 2018-05-21: qty 1

## 2018-05-21 MED ORDER — SODIUM CHLORIDE 0.9% FLUSH
10.0000 mL | Freq: Once | INTRAVENOUS | Status: AC
  Administered 2018-05-21: 10 mL via INTRAVENOUS
  Filled 2018-05-21: qty 10

## 2018-05-21 MED ORDER — SODIUM CHLORIDE 0.9 % IV SOLN
20.0000 mg | Freq: Once | INTRAVENOUS | Status: AC
  Administered 2018-05-21: 20 mg via INTRAVENOUS
  Filled 2018-05-21: qty 2

## 2018-05-21 MED ORDER — SODIUM CHLORIDE 0.9 % IV SOLN
Freq: Once | INTRAVENOUS | Status: AC
  Administered 2018-05-21: 10:00:00 via INTRAVENOUS
  Filled 2018-05-21: qty 1000

## 2018-05-21 MED ORDER — SODIUM CHLORIDE 0.9 % IV SOLN
80.0000 mg/m2 | Freq: Once | INTRAVENOUS | Status: AC
  Administered 2018-05-21: 144 mg via INTRAVENOUS
  Filled 2018-05-21: qty 24

## 2018-05-21 MED ORDER — FAMOTIDINE IN NACL 20-0.9 MG/50ML-% IV SOLN
20.0000 mg | Freq: Once | INTRAVENOUS | Status: AC
  Administered 2018-05-21: 20 mg via INTRAVENOUS
  Filled 2018-05-21: qty 50

## 2018-05-21 NOTE — Progress Notes (Signed)
Hematology/Oncology Follow Up Note [facility] Telephone:([phone] [phone]  Patient Care Team: Juluis Pitch, MD as PCP - General (Family Medicine)   Name of the patient: Theresa Chaney  [id number]  1981/05/18   Date of visit: 05/21/18 REASON FOR VISIT Follow up for Assessment prior to chemotherapy treatment of breast cancer  History of presenting illness 37 year old female with past medical history listed as below presented for evaluation of newly diagnosed right breast cancer. Patient self palpated a right breast mass 2 months ago during bathing. This triggered workup including mammogram and ultrasound. 10/05/2017 mammogram diagnostic breast tomo bilaterally showed multicentric area had a suspicion of breast cancer. 1  highly suspicious irregular mass with architectural distortion in the upper right breast at posterior depth measuring approximately 2 cm greatest dimension with associated architectural distortion and pleomorphic micro-calcifications. On ultrasound of vague hypoechoic masslike areas seen in the right breast at 11:30 o'clock axis. 2 second areas irregular hypoechoic mass in the right breast at the 11:30 o'clock axis, 6 cm from the nipple, at the superficial depth measuring 1.2 cm, likely correlate for a highly suspicious 1.4 cm extent of pleomorphic and a fine linear branching calcifications. 3 additional highly suspicious pleomorphic calcifications within the upper retroareolar right breast extending to this subareolar right breast, measuring 3.7 cm extending.  Right axilla was evaluated with ultrasound showing no enlarged or morphologically abnormal lymph nodes.  Patient underwent stereotactic biopsy, on 10/11/2017. Pathology showed A, carcinoma in situ intermediate to high grade with necrosis and calcifications at the right breast 11:30. B, right breast 5:30 subareolar, invasive carcinoma with diffusely infiltrative pattern in the  lymphovascular invasion. In a background of intermediate grade carcinoma in situ with necrosis and calcifications. C right breast 12:30, posterior common invasive mammary carcinoma of her type, associated with high-grade DCIS with necrosis and calcifications. Right breast 5:30 subareolar, ER +75%, PR negative HER-2 negative. Right breast 12:30 posterior, and ER +90%, and PR negative, and HER-2 negative.  Her aunt has a history of melanoma. . She is married, and she has a Psychiatrist and a biological daughter who is 45 years old. . She reports that she has a history of heart murmur which was diagnosed by primary care physician. She's never had any other health problems.   # 03/19/2018 she underwent right mastectomy and right axillary dissection. Pathology showed invasive mammary carcinoma, 8.7 cm, grade 3,  with lymphovascular invasion, surgical margins are close but negative.  1 out of 3 sentinel lymph nodes positive, right axillary dissection showed 2 out of the 12 lymph nodes positive for macro metastasis, 1 out of 12 lymph nodes positive for micrometastasis.  Pathological staging is ypT3 ypN2 She had right immediate breast reconstruction with placement of tissue expanders.Patient also chose to have bilateral salpingo-oophorectomy.    #Genetic testing Testing did not reveal any pathogenic mutation in any of these genes. A copy of the genetic test report will be scanned into Epic under the media tab. The genes analyzed were the 47 genes on [employer]'s Common Cancers panel (APC, ATM, AXIN2, BARD1, BMPR1A, BRCA1, BRCA2, BRIP1, CDH1, CDK4, CDKN2A, CHEK2, CTNNA1, DICER1, EPCAM, GREM1, HOXB13, KIT, MEN1, MLH1, MSH2, MSH3, MSH6, MUTYH, NBN, NF1, NTHL1, PALB2, PDGFRA, PMS2, POLD1, POLE, PTEN, RAD50, RAD51C, RAD51D, SDHA, SDHB, SDHC, SDHD, SMAD4, SMARCA4, STK11, TP53, TSC1, TSC2, VHL).    INTERVAL HISTORY Patient presents for assessment prior to weekly Taxol for breast cancer treatment.  Chemotherapy:  Patient reports tolerating Taxol.  Denies any numbness tingling, nausea  vomiting.  Denies fever or chills. Tachycardia in the palpitation: Slightly better.  recent 2D echo during the interval which showed normal LVEF.  She has been referred to cardiology and has an appointment this month. Shortness of breath with exertion : Stable at baseline. Fatigue: reports worsening fatigue. Chronic onset, perisistent, aggravated after zometa treatments.  no associated symptoms.     Cancer TREATMENT Neoadjuvant ddAC +1 dose of Taxol, due to lack of response, surgery was offered. Case was discussed on breast tumor board. 03/19/2018 S/p right mastectomy and right axillary dissection, immediate breast reconstruction with placement of expanders. Also had elective bilateral salpingo-oophorectomy..  # Currently on adjuvant Taxol weekly, need to finish total of 11 cycles adjuvantly.    Review of Systems  Constitutional: Positive for malaise/fatigue. Negative for chills, diaphoresis, fever and weight loss.  HENT: Negative for congestion, ear discharge, ear pain, hearing loss, nosebleeds, sinus pain, sore throat and tinnitus.   Eyes: Negative for blurred vision, double vision, photophobia, pain, discharge and redness.  Respiratory: Negative for cough, hemoptysis, sputum production, shortness of breath and wheezing.   Cardiovascular: Positive for palpitations. Negative for chest pain, orthopnea, claudication and leg swelling.       Fast heart rate.   Gastrointestinal: Negative for abdominal pain, blood in stool, constipation, diarrhea, heartburn, melena, nausea and vomiting.  Genitourinary: Negative for dysuria, flank pain, frequency, hematuria and urgency.  Musculoskeletal: Negative for back pain, myalgias and neck pain.  Skin: Negative for itching and rash.  Neurological: Negative for dizziness, tingling, tremors, speech change, focal weakness, weakness and headaches.  Endo/Heme/Allergies: Negative for environmental  allergies and polydipsia. Does not bruise/bleed easily.  Psychiatric/Behavioral: Negative for depression, hallucinations and substance abuse. The patient is not nervous/anxious.     No Known Allergies  Patient Active Problem List   Diagnosis Date Noted  . Estrogen receptor positive status (ER+) 04/04/2018  . Breast cancer of upper-outer quadrant of right female breast ([place]) 03/19/2018  . Family history of cancer   . Malignant neoplasm of overlapping sites of right breast in female, estrogen receptor positive ([place]) 10/19/2017     Past Medical History:  Diagnosis Date  . Anemia   . BRCA negative 11/26/2017  . Breast cancer ([place]) 10/11/2017   Multifocal, ER positive, PR negative, HER-2 negative.  . Chronic bronchitis ([place]) 11/2017  . COPD (chronic obstructive pulmonary disease) ([place])    MILD PER CXR  . Depression   . Family history of cancer   . GERD (gastroesophageal reflux disease)   . Headache    MIGRAINES  . Heart murmur    ASYMPTOMATIC  . Personal history of chemotherapy    current for right breast ca     Past Surgical History:  Procedure Laterality Date  . AXILLARY LYMPH NODE DISSECTION Right 03/19/2018   Procedure: AXILLARY LYMPH NODE DISSECTION;  Surgeon: Robert Bellow, MD;  Location: [facility] ORS;  Service: General;  Laterality: Right;  . BREAST BIOPSY Right 10/11/2017   12:30 posterior coil clip invasive mammary carcinoma  . BREAST BIOPSY Right 10/11/2017   11:30 middle depth ribbon clip DCIS  . BREAST BIOPSY Right 10/11/2017   5:30 anterior depth x shape invasive ductal carcinoma  . BREAST RECONSTRUCTION WITH PLACEMENT OF TISSUE EXPANDER AND FLEX HD (ACELLULAR HYDRATED DERMIS) Right 03/19/2018   Procedure: BREAST RECONSTRUCTION WITH PLACEMENT OF TISSUE EXPANDER AND FLEX HD (ACELLULAR HYDRATED DERMIS);  Surgeon: Wallace Going, DO;  Location: [facility] ORS;  Service: Plastics;  Laterality: Right;  . LAPAROSCOPIC BILATERAL SALPINGO  OOPHERECTOMY Bilateral 03/19/2018     Procedure: LAPAROSCOPIC BILATERAL SALPINGO OOPHORECTOMY;  Surgeon: Benjaman Kindler, MD;  Location: [facility] ORS;  Service: Gynecology;  Laterality: Bilateral;  . MASTECTOMY W/ SENTINEL NODE BIOPSY Right 03/19/2018   Procedure: MASTECTOMY WITH SENTINEL LYMPH NODE BIOPSY;  Surgeon: Robert Bellow, MD;  Location: [facility] ORS;  Service: General;  Laterality: Right;  . PORTACATH PLACEMENT Left 10/24/2017   Procedure: INSERTION PORT-A-CATH;  Surgeon: Robert Bellow, MD;  Location: [facility] ORS;  Service: General;  Laterality: Left;    Social History   Socioeconomic History  . Marital status: Married    Spouse name: Not on file  . Number of children: Not on file  . Years of education: Not on file  . Highest education level: Not on file  Occupational History  . Occupation: Occupational psychologist    Comment: [employer]  . Financial resource strain: Not on file  . Food insecurity:    Worry: Not on file    Inability: Not on file  . Transportation needs:    Medical: Not on file    Non-medical: Not on file  Tobacco Use  . Smoking status: Current Every Day Smoker    Packs/day: 0.50    Years: 5.00    Pack years: 2.50    Types: Cigarettes    Last attempt to quit: 12/12/2017    Years since quitting: 0.4  . Smokeless tobacco: Never Used  Substance and Sexual Activity  . Alcohol use: No    Frequency: Never  . Drug use: No  . Sexual activity: Yes    Birth control/protection: Injection  Lifestyle  . Physical activity:    Days per week: Not on file    Minutes per session: Not on file  . Stress: Not on file  Relationships  . Social connections:    Talks on phone: Not on file    Gets together: Not on file    Attends religious service: Not on file    Active member of club or organization: Not on file    Attends meetings of clubs or organizations: Not on file    Relationship status: Not on file  . Intimate partner violence:    Fear of current or ex partner:  Not on file    Emotionally abused: Not on file    Physically abused: Not on file    Forced sexual activity: Not on file  Other Topics Concern  . Not on file  Social History Narrative  . Not on file     Family History  Problem Relation Age of Onset  . Melanoma Maternal Aunt        other aunts with BCC/SCC/Melanoma  . Diabetes Father   . Hypertension Father   . Hyperlipidemia Father   . Bladder Cancer Maternal Grandmother   . Cervical cancer Maternal Aunt 64       daughter w/ cervical cancer as well  . Melanoma Maternal Uncle        other uncles with BCC/SCC/Melanoma     Current Outpatient Medications:  .  albuterol (PROVENTIL HFA;VENTOLIN HFA) 108 (90 Base) MCG/ACT inhaler, Inhale 2 puffs into the lungs every 6 (six) hours as needed for wheezing or shortness of breath (Cough)., Disp: 1 Inhaler, Rfl: 2 .  ALPRAZolam (XANAX) 0.25 MG tablet, Take 0.25 mg at bedtime as needed by mouth for anxiety or sleep. , Disp: , Rfl:  .  Calcium Carb-Cholecalciferol (CALCIUM 600 + D PO),  Take 1 tablet daily by mouth., Disp: , Rfl:  .  chlorhexidine (PERIDEX) 0.12 % solution, USE AS DIRECTED 15MLS IN THE MOUTH OR THROAT TWICE A DAY, Disp: 473 mL, Rfl: 1 .  doxylamine, Sleep, (UNISOM) 25 MG tablet, Take 25 mg by mouth at bedtime as needed for sleep., Disp: , Rfl:  .  escitalopram (LEXAPRO) 20 MG tablet, Take 20 mg by mouth every morning. , Disp: , Rfl:  .  esomeprazole (NEXIUM) 40 MG capsule, Take 40 mg by mouth every morning. , Disp: , Rfl:  .  ibuprofen (ADVIL,MOTRIN) 800 MG tablet, Take 1 tablet (800 mg total) by mouth every 8 (eight) hours as needed for moderate pain., Disp: 30 tablet, Rfl: 1 .  lidocaine-prilocaine (EMLA) cream, Apply to affected area once, Disp: 30 g, Rfl: 3 .  loratadine (CLARITIN) 10 MG tablet, Take 10 mg by mouth daily. , Disp: , Rfl:  .  prochlorperazine (COMPAZINE) 10 MG tablet, Take 1 tablet (10 mg total) by mouth every 6 (six) hours as needed (Nausea or vomiting)., Disp:  30 tablet, Rfl: 1 .  promethazine (PHENERGAN) 25 MG tablet, Take 1 tablet (25 mg total) by mouth every 6 (six) hours as needed for nausea or vomiting., Disp: 30 tablet, Rfl: 0 .  lidocaine (XYLOCAINE) 2 % solution, Use as directed 10 mLs in the mouth or throat every 4 (four) hours as needed for mouth pain. (Patient not taking: Reported on 05/08/2018), Disp: 200 mL, Rfl: 0 No current facility-administered medications for this visit.   Facility-Administered Medications Ordered in Other Visits:  .  dexamethasone (DECADRON) 20 mg in sodium chloride 0.9 % 50 mL IVPB, 20 mg, Intravenous, Once, Earlie Server, MD .  famotidine (PEPCID) IVPB 20 mg premix, 20 mg, Intravenous, Once, Earlie Server, MD, Last Rate: 200 mL/hr at 05/21/18 1013, 20 mg at 05/21/18 1013 .  heparin lock flush 100 unit/mL, 500 Units, Intravenous, Once, Earlie Server, MD .  PACLitaxel (TAXOL) 144 mg in sodium chloride 0.9 % 250 mL chemo infusion (</= '80mg'$ /m2), 80 mg/m2 (Treatment Plan Recorded), Intravenous, Once, Earlie Server, MD   Physical exam:  Vitals:   05/21/18 0919  BP: 118/73  Pulse: (!) 102  Resp: 18  Temp: (!) 96.8 F (36 C)  TempSrc: Tympanic  Weight: 169 lb 14.4 oz (77.1 kg)  ECOG 0 Physical Exam  Constitutional: She is oriented to person, place, and time and well-developed, well-nourished, and in no distress. No distress.  HENT:  Head: Normocephalic and atraumatic.  Nose: Nose normal.  Mouth/Throat: Oropharynx is clear and moist. No oropharyngeal exudate.  No thrush,  Eyes: Pupils are equal, round, and reactive to light. Conjunctivae and EOM are normal. Left eye exhibits no discharge. No scleral icterus.  Neck: Normal range of motion. Neck supple. No JVD present.  Cardiovascular: Normal rate, regular rhythm and normal heart sounds.  No murmur heard. Tachycardia  Pulmonary/Chest: Effort normal and breath sounds normal. No respiratory distress. She has no wheezes. She has no rales. She exhibits no tenderness.  Abdominal: Soft.  Bowel sounds are normal. She exhibits no distension and no mass. There is no tenderness. There is no rebound.  Musculoskeletal: Normal range of motion. She exhibits no edema or tenderness.  Lymphadenopathy:    She has no cervical adenopathy.  Neurological: She is alert and oriented to person, place, and time. No cranial nerve deficit. She exhibits normal muscle tone. Coordination normal.  Skin: Skin is warm and dry. No rash noted. She is not diaphoretic. No  erythema.  Psychiatric: Mood, affect and judgment normal.     CMP Latest Ref Rng & Units 05/21/2018  Glucose 65 - 99 mg/dL 140(H)  BUN 6 - 20 mg/dL 14  Creatinine 0.44 - 1.00 mg/dL 0.64  Sodium 135 - 145 mmol/L 137  Potassium 3.5 - 5.1 mmol/L 3.8  Chloride 101 - 111 mmol/L 106  CO2 22 - 32 mmol/L 23  Calcium 8.9 - 10.3 mg/dL 9.3  Total Protein 6.5 - 8.1 g/dL 7.2  Total Bilirubin 0.3 - 1.2 mg/dL 0.6  Alkaline Phos 38 - 126 U/L 82  AST 15 - 41 U/L 37  ALT 14 - 54 U/L 29   CBC Latest Ref Rng & Units 05/21/2018  WBC 3.6 - 11.0 K/uL 8.6  Hemoglobin 12.0 - 16.0 g/dL 11.4(L)  Hematocrit 35.0 - 47.0 % 32.2(L)  Platelets 150 - 440 K/uL 313    Assessment and plan- Patient is a 37 y.o. female presents for evaluation of newly diagnosed multicentric right breast cancer.  Cancer Staging Breast cancer of upper-outer quadrant of right female breast [facility]) Staging form: Breast, AJCC 8th Edition - Clinical: G2, ER+, PR+, HER2- - Signed by Earlie Server, MD on 04/05/2018 - Pathologic stage from 04/04/2018: No Stage Recommended (ypT3, pN2, cM0, G3, ER+, PR-, HER2-) - Signed by Earlie Server, MD on 04/04/2018     1. Encounter for antineoplastic chemotherapy   2. Malignant neoplasm of upper-outer quadrant of right breast in female, estrogen receptor positive ([place])   3. Anemia due to antineoplastic chemotherapy   4. Tachycardia   5. Other fatigue   Lumpectomy with sentinel lymph node biopsy.  Lumpectomy and sentinel lymph node biopsy  #Breast cancer,  currently on weekly Taxol.  Tolerates well.  Proceed with today's weekly Taxol. She has been previously referred to her radiation oncology.  To be started on RT after she completed adjuvant chemotherapy.# She has obtained dental clearance for starting Zometa.  S/p Zometa on 6/3/ 2019.  Plan Zometa every 6 months for 2 years.  Recommend over-the-counter calcium and vitamin D supplementation. After she finishes chemotherapy and Radiation, she maybe eligible for NATALEE trial in which she has 50% chance to be randomized to adjuvant Ribociclib with endocrine therapy.  I have previously referred patient to clinical trial coordinator.   # Tachycardia and SOB: EKG and 2D echo results reviewed with patient.  TSH was normal.  Likely secondary to anxiety or postmenopausal changes.. I have previously referred patient to see cardiologist given her previous exposure of Adriamycin.  During the interval.  Patient actually has felt improvement.  Continue monitor.  #Fatigue: Which is a new problem for her.  Gradual onset, seems last week.  Likely associated with chemotherapy and anemia due to anti-neoplasm treatment.  Continue monitor.  Anticipate to improve after she finish adjuvant chemotherapy. Normal TSH.   #Anemia due to anti-neoplasm treatment: Stable, continue to monitor. Check iron TIBC ferritin at next visit.    # bilateral oophorectomy, plan Aromatase inhibitor along with NATALEE trial.   Follow up in 1 week for lab and assessment prior to next Taxol  Orders Placed This Encounter  Procedures  . Iron and TIBC    Standing Status:   Future    Standing Expiration Date:   05/22/2019  . Ferritin    Standing Status:   Future    Standing Expiration Date:   05/22/2019   Earlie Server, MD, PhD Hematology Oncology [facility] Pager- [phone] 05/21/18

## 2018-05-21 NOTE — Progress Notes (Signed)
Patient here today for follow.  °

## 2018-05-24 ENCOUNTER — Telehealth: Payer: Self-pay | Admitting: General Surgery

## 2018-05-24 NOTE — Telephone Encounter (Signed)
I called patient to reschedule her appt from 05-03-18 she had cancelled.(being seen f/u rt mastectomy w/ reconstruction done 03-19-18. She is having problems now where the area where the areola was has separated a little(like tip of a finger) & Dr Marla Roe is putting a powder on it to help the skin cells regrow.Patient states it's been 2wks & it's working. She hasn't been able to have the spacers or the surgery. The surgery is scheduled in August. Do you want to see her back? If so when?

## 2018-05-28 ENCOUNTER — Inpatient Hospital Stay: Payer: BLUE CROSS/BLUE SHIELD

## 2018-05-28 ENCOUNTER — Other Ambulatory Visit: Payer: Self-pay

## 2018-05-28 ENCOUNTER — Inpatient Hospital Stay (HOSPITAL_BASED_OUTPATIENT_CLINIC_OR_DEPARTMENT_OTHER): Payer: BLUE CROSS/BLUE SHIELD | Admitting: Oncology

## 2018-05-28 ENCOUNTER — Encounter: Payer: Self-pay | Admitting: Oncology

## 2018-05-28 VITALS — BP 121/77 | HR 101 | Temp 97.5°F | Resp 18 | Wt 170.9 lb

## 2018-05-28 DIAGNOSIS — R5383 Other fatigue: Secondary | ICD-10-CM

## 2018-05-28 DIAGNOSIS — D6481 Anemia due to antineoplastic chemotherapy: Secondary | ICD-10-CM

## 2018-05-28 DIAGNOSIS — G620 Drug-induced polyneuropathy: Secondary | ICD-10-CM

## 2018-05-28 DIAGNOSIS — C50811 Malignant neoplasm of overlapping sites of right female breast: Secondary | ICD-10-CM | POA: Diagnosis not present

## 2018-05-28 DIAGNOSIS — Z170 Estrogen receptor positive status [ER+]: Secondary | ICD-10-CM | POA: Diagnosis not present

## 2018-05-28 DIAGNOSIS — Z9011 Acquired absence of right breast and nipple: Secondary | ICD-10-CM

## 2018-05-28 DIAGNOSIS — C50411 Malignant neoplasm of upper-outer quadrant of right female breast: Secondary | ICD-10-CM

## 2018-05-28 DIAGNOSIS — T451X5A Adverse effect of antineoplastic and immunosuppressive drugs, initial encounter: Secondary | ICD-10-CM | POA: Diagnosis not present

## 2018-05-28 DIAGNOSIS — Z5111 Encounter for antineoplastic chemotherapy: Secondary | ICD-10-CM

## 2018-05-28 DIAGNOSIS — R000 Tachycardia, unspecified: Secondary | ICD-10-CM | POA: Diagnosis not present

## 2018-05-28 DIAGNOSIS — F17210 Nicotine dependence, cigarettes, uncomplicated: Secondary | ICD-10-CM

## 2018-05-28 LAB — CBC WITH DIFFERENTIAL/PLATELET
Basophils Absolute: 0 10*3/uL (ref 0–0.1)
Basophils Relative: 1 %
EOS PCT: 1 %
Eosinophils Absolute: 0.1 10*3/uL (ref 0–0.7)
HCT: 33.8 % — ABNORMAL LOW (ref 35.0–47.0)
Hemoglobin: 11.7 g/dL — ABNORMAL LOW (ref 12.0–16.0)
LYMPHS ABS: 1.9 10*3/uL (ref 1.0–3.6)
LYMPHS PCT: 21 %
MCH: 30.8 pg (ref 26.0–34.0)
MCHC: 34.7 g/dL (ref 32.0–36.0)
MCV: 88.8 fL (ref 80.0–100.0)
Monocytes Absolute: 0.6 10*3/uL (ref 0.2–0.9)
Monocytes Relative: 7 %
Neutro Abs: 6.4 10*3/uL (ref 1.4–6.5)
Neutrophils Relative %: 70 %
PLATELETS: 313 10*3/uL (ref 150–440)
RBC: 3.81 MIL/uL (ref 3.80–5.20)
RDW: 15.5 % — AB (ref 11.5–14.5)
WBC: 9 10*3/uL (ref 3.6–11.0)

## 2018-05-28 LAB — COMPREHENSIVE METABOLIC PANEL
ALBUMIN: 4.1 g/dL (ref 3.5–5.0)
ALT: 25 U/L (ref 14–54)
AST: 28 U/L (ref 15–41)
Alkaline Phosphatase: 89 U/L (ref 38–126)
Anion gap: 8 (ref 5–15)
BILIRUBIN TOTAL: 0.6 mg/dL (ref 0.3–1.2)
BUN: 11 mg/dL (ref 6–20)
CHLORIDE: 106 mmol/L (ref 101–111)
CO2: 23 mmol/L (ref 22–32)
CREATININE: 0.68 mg/dL (ref 0.44–1.00)
Calcium: 9 mg/dL (ref 8.9–10.3)
GFR calc Af Amer: >60 mL/min (ref >60)
Glucose, Bld: 144 mg/dL — ABNORMAL HIGH (ref 65–99)
POTASSIUM: 3.4 mmol/L — AB (ref 3.5–5.1)
Sodium: 137 mmol/L (ref 135–145)
TOTAL PROTEIN: 7.2 g/dL (ref 6.5–8.1)

## 2018-05-28 LAB — IRON AND TIBC
Iron: 41 ug/dL (ref 28–170)
SATURATION RATIOS: 10 % — AB (ref 10.4–31.8)
TIBC: 404 ug/dL (ref 250–450)
UIBC: 363 ug/dL

## 2018-05-28 LAB — FERRITIN: FERRITIN: 70 ng/mL (ref 11–307)

## 2018-05-28 MED ORDER — SODIUM CHLORIDE 0.9% FLUSH
10.0000 mL | Freq: Once | INTRAVENOUS | Status: AC
  Administered 2018-05-28: 10 mL via INTRAVENOUS
  Filled 2018-05-28: qty 10

## 2018-05-28 MED ORDER — SODIUM CHLORIDE 0.9 % IV SOLN
Freq: Once | INTRAVENOUS | Status: AC
  Administered 2018-05-28: 12:00:00 via INTRAVENOUS
  Filled 2018-05-28: qty 1000

## 2018-05-28 MED ORDER — DEXAMETHASONE SODIUM PHOSPHATE 100 MG/10ML IJ SOLN
20.0000 mg | Freq: Once | INTRAMUSCULAR | Status: AC
  Administered 2018-05-28: 20 mg via INTRAVENOUS
  Filled 2018-05-28: qty 2

## 2018-05-28 MED ORDER — GABAPENTIN 300 MG PO CAPS: 300.0000 mg | ORAL_CAPSULE | Freq: Three times a day (TID) | ORAL | 0 refills | Status: DC

## 2018-05-28 MED ORDER — HEPARIN SOD (PORK) LOCK FLUSH 100 UNIT/ML IV SOLN
500.0000 [IU] | Freq: Once | INTRAVENOUS | Status: AC
  Administered 2018-05-28: 500 [IU] via INTRAVENOUS
  Filled 2018-05-28: qty 5

## 2018-05-28 MED ORDER — PACLITAXEL CHEMO INJECTION 300 MG/50ML
80.0000 mg/m2 | Freq: Once | INTRAVENOUS | Status: AC
  Administered 2018-05-28: 144 mg via INTRAVENOUS
  Filled 2018-05-28: qty 24

## 2018-05-28 MED ORDER — VITAMIN B-6 100 MG PO TABS: 100.0000 mg | ORAL_TABLET | Freq: Every day | ORAL | 1 refills | Status: DC

## 2018-05-28 MED ORDER — FAMOTIDINE IN NACL 20-0.9 MG/50ML-% IV SOLN
20.0000 mg | Freq: Once | INTRAVENOUS | Status: AC
  Administered 2018-05-28: 20 mg via INTRAVENOUS
  Filled 2018-05-28: qty 50

## 2018-05-28 MED ORDER — DIPHENHYDRAMINE HCL 50 MG/ML IJ SOLN
50.0000 mg | Freq: Once | INTRAMUSCULAR | Status: AC
  Administered 2018-05-28: 50 mg via INTRAVENOUS
  Filled 2018-05-28: qty 1

## 2018-05-28 NOTE — Progress Notes (Signed)
Hematology/Oncology Follow Up Note [facility] [phone] [phone]  Patient Care Team: Juluis Pitch, MD as PCP - General (Family Medicine)   Name of the patient: Theresa Chaney  [id number]  01/03/1981   Date of visit: 05/28/18 REASON FOR VISIT Follow up for Assessment prior to chemotherapy treatment of breast cancer  History of presenting illness 37 year old female with past medical history listed as below presented for evaluation of newly diagnosed right breast cancer. Patient self palpated a right breast mass 2 months ago during bathing. This triggered workup including mammogram and ultrasound. 10/05/2017 mammogram diagnostic breast tomo bilaterally showed multicentric area had a suspicion of breast cancer. 1  highly suspicious irregular mass with architectural distortion in the upper right breast at posterior depth measuring approximately 2 cm greatest dimension with associated architectural distortion and pleomorphic micro-calcifications. On ultrasound of vague hypoechoic masslike areas seen in the right breast at 11:30 o'clock axis. 2 second areas irregular hypoechoic mass in the right breast at the 11:30 o'clock axis, 6 cm from the nipple, at the superficial depth measuring 1.2 cm, likely correlate for a highly suspicious 1.4 cm extent of pleomorphic and a fine linear branching calcifications. 3 additional highly suspicious pleomorphic calcifications within the upper retroareolar right breast extending to this subareolar right breast, measuring 3.7 cm extending.  Right axilla was evaluated with ultrasound showing no enlarged or morphologically abnormal lymph nodes.  Patient underwent stereotactic biopsy, on 10/11/2017. Pathology showed A, carcinoma in situ intermediate to high grade with necrosis and calcifications at the right breast 11:30. B, right breast 5:30 subareolar, invasive carcinoma with diffusely infiltrative pattern in the  lymphovascular invasion. In a background of intermediate grade carcinoma in situ with necrosis and calcifications. C right breast 12:30, posterior common invasive mammary carcinoma of her type, associated with high-grade DCIS with necrosis and calcifications. Right breast 5:30 subareolar, ER +75%, PR negative HER-2 negative. Right breast 12:30 posterior, and ER +90%, and PR negative, and HER-2 negative.  Her aunt has a history of melanoma. . She is married, and she has a Psychiatrist and a biological daughter who is 53 years old. . She reports that she has a history of heart murmur which was diagnosed by primary care physician. She's never had any other health problems.   # 03/19/2018 she underwent right mastectomy and right axillary dissection. Pathology showed invasive mammary carcinoma, 8.7 cm, grade 3,  with lymphovascular invasion, surgical margins are close but negative.  1 out of 3 sentinel lymph nodes positive, right axillary dissection showed 2 out of the 12 lymph nodes positive for macro metastasis, 1 out of 12 lymph nodes positive for micrometastasis.  Pathological staging is ypT3 ypN2 She had right immediate breast reconstruction with placement of tissue expanders.Patient also chose to have bilateral salpingo-oophorectomy.    #Genetic testing Testing did not reveal any pathogenic mutation in any of these genes. A copy of the genetic test report will be scanned into Epic under the media tab. The genes analyzed were the 47 genes on [employer]'s Common Cancers panel (APC, ATM, AXIN2, BARD1, BMPR1A, BRCA1, BRCA2, BRIP1, CDH1, CDK4, CDKN2A, CHEK2, CTNNA1, DICER1, EPCAM, GREM1, HOXB13, KIT, MEN1, MLH1, MSH2, MSH3, MSH6, MUTYH, NBN, NF1, NTHL1, PALB2, PDGFRA, PMS2, POLD1, POLE, PTEN, RAD50, RAD51C, RAD51D, SDHA, SDHB, SDHC, SDHD, SMAD4, SMARCA4, STK11, TP53, TSC1, TSC2, VHL).    INTERVAL HISTORY Patient presents for assessment prior to be intact so for breast cancer treatment.  #Chemotherapy:  Taxol well.  Denies nausea vomiting denies any fever  or chills. #Bilateral fingertip and toe tingling, this is a new problem for her.  Started about 1 to 2 weeks ago, gradual onset, aggravating each Taxol treatment, and holding cold food.  Initially, the numbness tingling feeling was intermittent.  It has been worsened in her persistently.  No improving factor  #Tachycardia in the palpitation: Better.   # Shortness of breath with exertion : Stable at baseline. Fatigue: Stable,    Cancer TREATMENT Neoadjuvant ddAC +1 dose of Taxol, due to lack of response, surgery was offered. Case was discussed on breast tumor board. 03/19/2018 S/p right mastectomy and right axillary dissection, immediate breast reconstruction with placement of expanders. Also had elective bilateral salpingo-oophorectomy..  # Currently on adjuvant Taxol weekly, need to finish total of 11 cycles adjuvantly.  # .# She has obtained dental clearance for starting Zometa.  S/p Zometa on 6/3/ 2019  Review of Systems  Constitutional: Positive for malaise/fatigue. Negative for chills, diaphoresis, fever and weight loss.  HENT: Negative for congestion, ear discharge, ear pain, hearing loss, nosebleeds, sinus pain, sore throat and tinnitus.   Eyes: Negative for blurred vision, double vision, photophobia, pain, discharge and redness.  Respiratory: Negative for cough, hemoptysis, sputum production, shortness of breath and wheezing.   Cardiovascular: Negative for chest pain, palpitations, orthopnea, claudication and leg swelling.       Fast heart rate.   Gastrointestinal: Negative for abdominal pain, blood in stool, constipation, diarrhea, heartburn, melena, nausea and vomiting.  Genitourinary: Negative for dysuria, flank pain, frequency, hematuria and urgency.  Musculoskeletal: Negative for back pain, myalgias and neck pain.  Skin: Negative for itching and rash.  Neurological: Positive for tingling. Negative for dizziness, tremors, speech  change, focal weakness, weakness and headaches.  Endo/Heme/Allergies: Negative for environmental allergies and polydipsia. Does not bruise/bleed easily.  Psychiatric/Behavioral: Negative for depression, hallucinations and substance abuse. The patient is not nervous/anxious.     No Known Allergies  Patient Active Problem List   Diagnosis Date Noted  . Estrogen receptor positive status (ER+) 04/04/2018  . Breast cancer of upper-outer quadrant of right female breast ([place]) 03/19/2018  . Family history of cancer   . Malignant neoplasm of overlapping sites of right breast in female, estrogen receptor positive ([place]) 10/19/2017     Past Medical History:  Diagnosis Date  . Anemia   . BRCA negative 11/26/2017  . Breast cancer ([place]) 10/11/2017   Multifocal, ER positive, PR negative, HER-2 negative.  . Chronic bronchitis ([place]) 11/2017  . COPD (chronic obstructive pulmonary disease) ([place])    MILD PER CXR  . Depression   . Family history of cancer   . GERD (gastroesophageal reflux disease)   . Headache    MIGRAINES  . Heart murmur    ASYMPTOMATIC  . Personal history of chemotherapy    current for right breast ca     Past Surgical History:  Procedure Laterality Date  . AXILLARY LYMPH NODE DISSECTION Right 03/19/2018   Procedure: AXILLARY LYMPH NODE DISSECTION;  Surgeon: Robert Bellow, MD;  Location: [facility] ORS;  Service: General;  Laterality: Right;  . BREAST BIOPSY Right 10/11/2017   12:30 posterior coil clip invasive mammary carcinoma  . BREAST BIOPSY Right 10/11/2017   11:30 middle depth ribbon clip DCIS  . BREAST BIOPSY Right 10/11/2017   5:30 anterior depth x shape invasive ductal carcinoma  . BREAST RECONSTRUCTION WITH PLACEMENT OF TISSUE EXPANDER AND FLEX HD (ACELLULAR HYDRATED DERMIS) Right 03/19/2018   Procedure: BREAST RECONSTRUCTION WITH PLACEMENT OF TISSUE EXPANDER AND  FLEX HD (ACELLULAR HYDRATED DERMIS);  Surgeon: Wallace Going, DO;  Location: [facility] ORS;  Service:  Plastics;  Laterality: Right;  . LAPAROSCOPIC BILATERAL SALPINGO OOPHERECTOMY Bilateral 03/19/2018   Procedure: LAPAROSCOPIC BILATERAL SALPINGO OOPHORECTOMY;  Surgeon: Benjaman Kindler, MD;  Location: [facility] ORS;  Service: Gynecology;  Laterality: Bilateral;  . MASTECTOMY W/ SENTINEL NODE BIOPSY Right 03/19/2018   Procedure: MASTECTOMY WITH SENTINEL LYMPH NODE BIOPSY;  Surgeon: Robert Bellow, MD;  Location: [facility] ORS;  Service: General;  Laterality: Right;  . PORTACATH PLACEMENT Left 10/24/2017   Procedure: INSERTION PORT-A-CATH;  Surgeon: Robert Bellow, MD;  Location: [facility] ORS;  Service: General;  Laterality: Left;    Social History   Socioeconomic History  . Marital status: Married    Spouse name: Not on file  . Number of children: Not on file  . Years of education: Not on file  . Highest education level: Not on file  Occupational History  . Occupation: Occupational psychologist    Comment: [employer] [place]  . Financial resource strain: Not on file  . Food insecurity:    Worry: Not on file    Inability: Not on file  . Transportation needs:    Medical: Not on file    Non-medical: Not on file  Tobacco Use  . Smoking status: Current Every Day Smoker    Packs/day: 0.50    Years: 5.00    Pack years: 2.50    Types: Cigarettes    Last attempt to quit: 12/12/2017    Years since quitting: 0.4  . Smokeless tobacco: Never Used  Substance and Sexual Activity  . Alcohol use: No    Frequency: Never  . Drug use: No  . Sexual activity: Yes    Birth control/protection: Injection  Lifestyle  . Physical activity:    Days per week: Not on file    Minutes per session: Not on file  . Stress: Not on file  Relationships  . Social connections:    Talks on phone: Not on file    Gets together: Not on file    Attends religious service: Not on file    Active member of club or organization: Not on file    Attends meetings of clubs or organizations: Not on file     Relationship status: Not on file  . Intimate partner violence:    Fear of current or ex partner: Not on file    Emotionally abused: Not on file    Physically abused: Not on file    Forced sexual activity: Not on file  Other Topics Concern  . Not on file  Social History Narrative  . Not on file     Family History  Problem Relation Age of Onset  . Melanoma Maternal Aunt        other aunts with BCC/SCC/Melanoma  . Diabetes Father   . Hypertension Father   . Hyperlipidemia Father   . Bladder Cancer Maternal Grandmother   . Cervical cancer Maternal Aunt 64       daughter w/ cervical cancer as well  . Melanoma Maternal Uncle        other uncles with BCC/SCC/Melanoma     Current Outpatient Medications:  .  albuterol (PROVENTIL HFA;VENTOLIN HFA) 108 (90 Base) MCG/ACT inhaler, Inhale 2 puffs into the lungs every 6 (six) hours as needed for wheezing or shortness of breath (Cough)., Disp: 1 Inhaler, Rfl: 2 .  ALPRAZolam (XANAX) 0.25 MG tablet, Take 0.25  mg at bedtime as needed by mouth for anxiety or sleep. , Disp: , Rfl:  .  Calcium Carb-Cholecalciferol (CALCIUM 600 + D PO), Take 1 tablet daily by mouth., Disp: , Rfl:  .  chlorhexidine (PERIDEX) 0.12 % solution, USE AS DIRECTED 15MLS IN THE MOUTH OR THROAT TWICE A DAY, Disp: 473 mL, Rfl: 1 .  doxylamine, Sleep, (UNISOM) 25 MG tablet, Take 25 mg by mouth at bedtime as needed for sleep., Disp: , Rfl:  .  escitalopram (LEXAPRO) 20 MG tablet, Take 20 mg by mouth every morning. , Disp: , Rfl:  .  esomeprazole (NEXIUM) 40 MG capsule, Take 40 mg by mouth every morning. , Disp: , Rfl:  .  ibuprofen (ADVIL,MOTRIN) 800 MG tablet, Take 1 tablet (800 mg total) by mouth every 8 (eight) hours as needed for moderate pain., Disp: 30 tablet, Rfl: 1 .  loratadine (CLARITIN) 10 MG tablet, Take 10 mg by mouth daily. , Disp: , Rfl:  .  prochlorperazine (COMPAZINE) 10 MG tablet, Take 1 tablet (10 mg total) by mouth every 6 (six) hours as needed (Nausea or  vomiting)., Disp: 30 tablet, Rfl: 1 .  promethazine (PHENERGAN) 25 MG tablet, Take 1 tablet (25 mg total) by mouth every 6 (six) hours as needed for nausea or vomiting., Disp: 30 tablet, Rfl: 0 .  gabapentin (NEURONTIN) 300 MG capsule, Take 1 capsule (300 mg total) by mouth 3 (three) times daily., Disp: 90 capsule, Rfl: 0 .  lidocaine (XYLOCAINE) 2 % solution, Use as directed 10 mLs in the mouth or throat every 4 (four) hours as needed for mouth pain. (Patient not taking: Reported on 05/28/2018), Disp: 200 mL, Rfl: 0 .  lidocaine-prilocaine (EMLA) cream, Apply to affected area once (Patient not taking: Reported on 05/28/2018), Disp: 30 g, Rfl: 3 .  pyridOXINE (VITAMIN B-6) 100 MG tablet, Take 1 tablet (100 mg total) by mouth daily., Disp: 30 tablet, Rfl: 1   Physical exam:  Vitals:   05/28/18 1102  BP: 121/77  Pulse: (!) 101  Resp: 18  Temp: (!) 97.5 F (36.4 C)  TempSrc: Oral  Weight: 170 lb 14.4 oz (77.5 kg)  ECOG 0 Physical Exam  Constitutional: She is oriented to person, place, and time and well-developed, well-nourished, and in no distress. No distress.  HENT:  Head: Normocephalic and atraumatic.  Nose: Nose normal.  Mouth/Throat: Oropharynx is clear and moist. No oropharyngeal exudate.  No thrush,  Eyes: Pupils are equal, round, and reactive to light. Conjunctivae and EOM are normal. Left eye exhibits no discharge. No scleral icterus.  Neck: Normal range of motion. Neck supple. No JVD present.  Cardiovascular: Normal rate, regular rhythm and normal heart sounds.  No murmur heard. Tachycardia  Pulmonary/Chest: Effort normal and breath sounds normal. No respiratory distress. She has no wheezes. She has no rales. She exhibits no tenderness.  Abdominal: Soft. Bowel sounds are normal. She exhibits no distension and no mass. There is no tenderness. There is no rebound.  Musculoskeletal: Normal range of motion. She exhibits no edema or tenderness.  Lymphadenopathy:    She has no  cervical adenopathy.  Neurological: She is alert and oriented to person, place, and time. No cranial nerve deficit. She exhibits normal muscle tone. Coordination normal.  Skin: Skin is warm and dry. No rash noted. She is not diaphoretic. No erythema.  Psychiatric: Mood, affect and judgment normal.     CMP Latest Ref Rng & Units 05/28/2018  Glucose 65 - 99 mg/dL 144(H)  BUN  6 - 20 mg/dL 11  Creatinine 0.44 - 1.00 mg/dL 0.68  Sodium 135 - 145 mmol/L 137  Potassium 3.5 - 5.1 mmol/L 3.4(L)  Chloride 101 - 111 mmol/L 106  CO2 22 - 32 mmol/L 23  Calcium 8.9 - 10.3 mg/dL 9.0  Total Protein 6.5 - 8.1 g/dL 7.2  Total Bilirubin 0.3 - 1.2 mg/dL 0.6  Alkaline Phos 38 - 126 U/L 89  AST 15 - 41 U/L 28  ALT 14 - 54 U/L 25   CBC Latest Ref Rng & Units 05/28/2018  WBC 3.6 - 11.0 K/uL 9.0  Hemoglobin 12.0 - 16.0 g/dL 11.7(L)  Hematocrit 35.0 - 47.0 % 33.8(L)  Platelets 150 - 440 K/uL 313    Assessment and plan- Patient is a 37 y.o. female presents for evaluation of newly diagnosed multicentric right breast cancer.  Cancer Staging Breast cancer of upper-outer quadrant of right female breast [facility]) Staging form: Breast, AJCC 8th Edition - Clinical: G2, ER+, PR+, HER2- - Signed by Earlie Server, MD on 04/05/2018 - Pathologic stage from 04/04/2018: No Stage Recommended (ypT3, pN2, cM0, G3, ER+, PR-, HER2-) - Signed by Earlie Server, MD on 04/04/2018     1. Encounter for antineoplastic chemotherapy   2. Malignant neoplasm of overlapping sites of right breast in female, estrogen receptor positive ([place])   3. Anemia due to antineoplastic chemotherapy   4. Tachycardia   5. Other fatigue   6. Neuropathy due to chemotherapeutic drug ([place])     #Breast cancer, currently on weekly Taxol.  Tolerates well except worsening of the.  Neuropathy.  Tolerating well. We will proceed with today's weekly Taxol. She needs to follow-up with radiation oncology and start radiation after she completes adjuvant  chemotherapy.  #Neuropathy secondary to chemotherapy drugs: Start gabapentin,  advised patient to take 300 mg x 3 days, if tolerating then to 300 mg twice daily for 3 days eventually asked to 300 mg 3 times a day.  Sent prescription of vitamin B6 as well. Discussed with patient that we may have to decrease the dose of if this medication not helping with her neuropathy or worse. #After she finishes chemotherapy and Radiation, she maybe eligible for NATALEE trial in which she has 50% chance to be randomized to adjuvant Ribociclib with endocrine therapy.  I have previously referred patient to clinical trial coordinator.   # Tachycardia and SOB: Stable.  Not better or worse.  Continue monitor.  #Fatigue: Stable, monitor. #Anemia due to anti-neoplasm treatment: Stable, continue to monitor.    # bilateral oophorectomy, after she completes chemoradiation, plan Aromatase inhibitor along with NATALEE trial.  #  Follow up in 1 week for laband  Taxol and  follow-up 2 weeks for assessment prior to last Taxol treatment.  Earlie Server, MD, PhD Hematology Oncology [facility] at [facility] Pager- [phone] 05/28/18

## 2018-05-28 NOTE — Progress Notes (Signed)
Patient here today for follow up. Voices concerned that numbness and tingling to hands and feet is getting worse.

## 2018-06-04 ENCOUNTER — Inpatient Hospital Stay: Payer: BLUE CROSS/BLUE SHIELD

## 2018-06-04 ENCOUNTER — Ambulatory Visit: Payer: BLUE CROSS/BLUE SHIELD | Admitting: Nurse Practitioner

## 2018-06-04 VITALS — BP 101/66 | HR 99 | Temp 97.4°F | Resp 20 | Wt 172.0 lb

## 2018-06-04 DIAGNOSIS — Z170 Estrogen receptor positive status [ER+]: Principal | ICD-10-CM

## 2018-06-04 DIAGNOSIS — Z5111 Encounter for antineoplastic chemotherapy: Secondary | ICD-10-CM | POA: Diagnosis not present

## 2018-06-04 DIAGNOSIS — C50811 Malignant neoplasm of overlapping sites of right female breast: Secondary | ICD-10-CM

## 2018-06-04 LAB — CBC WITH DIFFERENTIAL/PLATELET
Basophils Absolute: 0.1 10*3/uL (ref 0–0.1)
Basophils Relative: 1 %
EOS ABS: 0.1 10*3/uL (ref 0–0.7)
EOS PCT: 1 %
HCT: 32.3 % — ABNORMAL LOW (ref 35.0–47.0)
Hemoglobin: 11.3 g/dL — ABNORMAL LOW (ref 12.0–16.0)
LYMPHS ABS: 1.9 10*3/uL (ref 1.0–3.6)
Lymphocytes Relative: 23 %
MCH: 31.4 pg (ref 26.0–34.0)
MCHC: 35 g/dL (ref 32.0–36.0)
MCV: 89.7 fL (ref 80.0–100.0)
MONOS PCT: 8 %
Monocytes Absolute: 0.6 10*3/uL (ref 0.2–0.9)
Neutro Abs: 5.5 10*3/uL (ref 1.4–6.5)
Neutrophils Relative %: 67 %
Platelets: 316 10*3/uL (ref 150–440)
RBC: 3.6 MIL/uL — ABNORMAL LOW (ref 3.80–5.20)
RDW: 15.8 % — ABNORMAL HIGH (ref 11.5–14.5)
WBC: 8.1 10*3/uL (ref 3.6–11.0)

## 2018-06-04 LAB — COMPREHENSIVE METABOLIC PANEL
ALT: 22 U/L (ref 14–54)
AST: 27 U/L (ref 15–41)
Albumin: 3.9 g/dL (ref 3.5–5.0)
Alkaline Phosphatase: 86 U/L (ref 38–126)
Anion gap: 7 (ref 5–15)
BUN: 8 mg/dL (ref 6–20)
CHLORIDE: 108 mmol/L (ref 101–111)
CO2: 23 mmol/L (ref 22–32)
CREATININE: 0.67 mg/dL (ref 0.44–1.00)
Calcium: 8.7 mg/dL — ABNORMAL LOW (ref 8.9–10.3)
GFR calc Af Amer: >60 mL/min (ref >60)
GFR calc non Af Amer: >60 mL/min (ref >60)
GLUCOSE: 124 mg/dL — AB (ref 65–99)
Potassium: 3.6 mmol/L (ref 3.5–5.1)
SODIUM: 138 mmol/L (ref 135–145)
Total Bilirubin: 0.5 mg/dL (ref 0.3–1.2)
Total Protein: 7 g/dL (ref 6.5–8.1)

## 2018-06-04 MED ORDER — SODIUM CHLORIDE 0.9 % IV SOLN
80.0000 mg/m2 | Freq: Once | INTRAVENOUS | Status: AC
  Administered 2018-06-04: 144 mg via INTRAVENOUS
  Filled 2018-06-04: qty 24

## 2018-06-04 MED ORDER — SODIUM CHLORIDE 0.9% FLUSH
10.0000 mL | INTRAVENOUS | Status: AC | PRN
  Administered 2018-06-04: 10 mL
  Filled 2018-06-04: qty 10

## 2018-06-04 MED ORDER — SODIUM CHLORIDE 0.9 % IV SOLN
Freq: Once | INTRAVENOUS | Status: AC
  Administered 2018-06-04: 12:00:00 via INTRAVENOUS
  Filled 2018-06-04: qty 1000

## 2018-06-04 MED ORDER — SODIUM CHLORIDE 0.9 % IV SOLN
Freq: Once | INTRAVENOUS | Status: AC
Start: 2018-06-04 — End: 2018-06-04
  Administered 2018-06-04: 12:00:00 via INTRAVENOUS
  Filled 2018-06-04: qty 1000

## 2018-06-04 MED ORDER — FAMOTIDINE IN NACL 20-0.9 MG/50ML-% IV SOLN
20.0000 mg | Freq: Once | INTRAVENOUS | Status: AC
Start: 2018-06-04 — End: 2018-06-04
  Administered 2018-06-04: 20 mg via INTRAVENOUS
  Filled 2018-06-04: qty 50

## 2018-06-04 MED ORDER — HEPARIN SOD (PORK) LOCK FLUSH 100 UNIT/ML IV SOLN
500.0000 [IU] | INTRAVENOUS | Status: AC | PRN
  Administered 2018-06-04: 500 [IU]
  Filled 2018-06-04: qty 5

## 2018-06-04 MED ORDER — DIPHENHYDRAMINE HCL 50 MG/ML IJ SOLN
50.0000 mg | Freq: Once | INTRAMUSCULAR | Status: AC
  Administered 2018-06-04: 50 mg via INTRAVENOUS
  Filled 2018-06-04: qty 1

## 2018-06-04 MED ORDER — DEXAMETHASONE SODIUM PHOSPHATE 100 MG/10ML IJ SOLN
20.0000 mg | Freq: Once | INTRAMUSCULAR | Status: AC
  Administered 2018-06-04: 20 mg via INTRAVENOUS
  Filled 2018-06-04: qty 2

## 2018-06-08 ENCOUNTER — Encounter: Payer: Self-pay | Admitting: *Deleted

## 2018-06-11 ENCOUNTER — Inpatient Hospital Stay: Payer: BLUE CROSS/BLUE SHIELD | Attending: Oncology

## 2018-06-11 ENCOUNTER — Inpatient Hospital Stay: Payer: BLUE CROSS/BLUE SHIELD

## 2018-06-11 ENCOUNTER — Encounter: Payer: Self-pay | Admitting: Oncology

## 2018-06-11 ENCOUNTER — Other Ambulatory Visit: Payer: Self-pay

## 2018-06-11 ENCOUNTER — Inpatient Hospital Stay (HOSPITAL_BASED_OUTPATIENT_CLINIC_OR_DEPARTMENT_OTHER): Payer: BLUE CROSS/BLUE SHIELD | Admitting: Oncology

## 2018-06-11 VITALS — BP 119/81 | HR 108 | Temp 97.0°F | Resp 18 | Wt 171.9 lb

## 2018-06-11 DIAGNOSIS — Z5111 Encounter for antineoplastic chemotherapy: Secondary | ICD-10-CM

## 2018-06-11 DIAGNOSIS — R0602 Shortness of breath: Secondary | ICD-10-CM

## 2018-06-11 DIAGNOSIS — G620 Drug-induced polyneuropathy: Secondary | ICD-10-CM | POA: Diagnosis not present

## 2018-06-11 DIAGNOSIS — D6481 Anemia due to antineoplastic chemotherapy: Secondary | ICD-10-CM | POA: Diagnosis not present

## 2018-06-11 DIAGNOSIS — R000 Tachycardia, unspecified: Secondary | ICD-10-CM | POA: Insufficient documentation

## 2018-06-11 DIAGNOSIS — Z90722 Acquired absence of ovaries, bilateral: Secondary | ICD-10-CM

## 2018-06-11 DIAGNOSIS — Z853 Personal history of malignant neoplasm of breast: Secondary | ICD-10-CM

## 2018-06-11 DIAGNOSIS — C50811 Malignant neoplasm of overlapping sites of right female breast: Secondary | ICD-10-CM

## 2018-06-11 DIAGNOSIS — Z170 Estrogen receptor positive status [ER+]: Secondary | ICD-10-CM | POA: Diagnosis not present

## 2018-06-11 DIAGNOSIS — R5383 Other fatigue: Secondary | ICD-10-CM | POA: Insufficient documentation

## 2018-06-11 DIAGNOSIS — C50411 Malignant neoplasm of upper-outer quadrant of right female breast: Secondary | ICD-10-CM | POA: Insufficient documentation

## 2018-06-11 DIAGNOSIS — T451X5A Adverse effect of antineoplastic and immunosuppressive drugs, initial encounter: Secondary | ICD-10-CM

## 2018-06-11 LAB — CBC WITH DIFFERENTIAL/PLATELET
BASOS ABS: 0.1 10*3/uL (ref 0–0.1)
Basophils Relative: 1 %
Eosinophils Absolute: 0.1 10*3/uL (ref 0–0.7)
Eosinophils Relative: 1 %
HEMATOCRIT: 32.4 % — AB (ref 35.0–47.0)
Hemoglobin: 11.2 g/dL — ABNORMAL LOW (ref 12.0–16.0)
LYMPHS ABS: 1.9 10*3/uL (ref 1.0–3.6)
LYMPHS PCT: 24 %
MCH: 31.6 pg (ref 26.0–34.0)
MCHC: 34.6 g/dL (ref 32.0–36.0)
MCV: 91.3 fL (ref 80.0–100.0)
Monocytes Absolute: 0.6 10*3/uL (ref 0.2–0.9)
Monocytes Relative: 8 %
NEUTROS ABS: 5.4 10*3/uL (ref 1.4–6.5)
NEUTROS PCT: 66 %
Platelets: 288 10*3/uL (ref 150–440)
RBC: 3.55 MIL/uL — AB (ref 3.80–5.20)
RDW: 16.7 % — ABNORMAL HIGH (ref 11.5–14.5)
WBC: 8.1 10*3/uL (ref 3.6–11.0)

## 2018-06-11 LAB — COMPREHENSIVE METABOLIC PANEL
ALT: 25 U/L (ref 0–44)
AST: 32 U/L (ref 15–41)
Albumin: 3.8 g/dL (ref 3.5–5.0)
Alkaline Phosphatase: 78 U/L (ref 38–126)
Anion gap: 10 (ref 5–15)
BUN: 12 mg/dL (ref 6–20)
CO2: 22 mmol/L (ref 22–32)
Calcium: 8.5 mg/dL — ABNORMAL LOW (ref 8.9–10.3)
Chloride: 105 mmol/L (ref 98–111)
Creatinine, Ser: 0.64 mg/dL (ref 0.44–1.00)
Glucose, Bld: 135 mg/dL — ABNORMAL HIGH (ref 70–99)
POTASSIUM: 3.7 mmol/L (ref 3.5–5.1)
Sodium: 137 mmol/L (ref 135–145)
Total Bilirubin: 0.6 mg/dL (ref 0.3–1.2)
Total Protein: 6.6 g/dL (ref 6.5–8.1)

## 2018-06-11 MED ORDER — LETROZOLE 2.5 MG PO TABS: 2.5000 mg | ORAL_TABLET | Freq: Every day | ORAL | 3 refills | Status: DC

## 2018-06-11 MED ORDER — DIPHENHYDRAMINE HCL 50 MG/ML IJ SOLN
50.0000 mg | Freq: Once | INTRAMUSCULAR | Status: AC
  Administered 2018-06-11: 50 mg via INTRAVENOUS
  Filled 2018-06-11: qty 1

## 2018-06-11 MED ORDER — DEXAMETHASONE SODIUM PHOSPHATE 100 MG/10ML IJ SOLN
20.0000 mg | Freq: Once | INTRAMUSCULAR | Status: AC
  Administered 2018-06-11: 20 mg via INTRAVENOUS
  Filled 2018-06-11: qty 2

## 2018-06-11 MED ORDER — SODIUM CHLORIDE 0.9 % IV SOLN
Freq: Once | INTRAVENOUS | Status: AC
  Administered 2018-06-11: 09:00:00 via INTRAVENOUS
  Filled 2018-06-11: qty 1000

## 2018-06-11 MED ORDER — HEPARIN SOD (PORK) LOCK FLUSH 100 UNIT/ML IV SOLN
500.0000 [IU] | Freq: Once | INTRAVENOUS | Status: AC
  Administered 2018-06-11: 500 [IU] via INTRAVENOUS
  Filled 2018-06-11: qty 5

## 2018-06-11 MED ORDER — SODIUM CHLORIDE 0.9 % IV SOLN
65.0000 mg/m2 | Freq: Once | INTRAVENOUS | Status: AC
  Administered 2018-06-11: 120 mg via INTRAVENOUS
  Filled 2018-06-11: qty 20

## 2018-06-11 MED ORDER — FAMOTIDINE IN NACL 20-0.9 MG/50ML-% IV SOLN
20.0000 mg | Freq: Once | INTRAVENOUS | Status: AC
  Administered 2018-06-11: 20 mg via INTRAVENOUS
  Filled 2018-06-11: qty 50

## 2018-06-11 MED ORDER — SODIUM CHLORIDE 0.9% FLUSH
10.0000 mL | Freq: Once | INTRAVENOUS | Status: AC
  Administered 2018-06-11: 10 mL via INTRAVENOUS
  Filled 2018-06-11: qty 10

## 2018-06-11 NOTE — Progress Notes (Signed)
Hematology/Oncology Follow Up Note [facility] [phone] [phone]  Patient Care Team: Juluis Pitch, MD as PCP - General (Family Medicine)   Name of the patient: Theresa Chaney  [id number]  1981-02-02   Date of visit: 06/11/18 REASON FOR VISIT Follow up for Assessment prior to chemotherapy treatment of breast cancer  Oncology History 10/11/2017 Diagnosed with [id number]  Neoadjuvant dose dense AC and 1 cycle of Taxol.  Interim image showed no treatment response. 03/19/2018 right mastectomy and right axillary dissection, bilateral salpingo-oophorectomy. ypT3 ypN2 #Negative genetic testing #06/11/2018 Finish adjuvant weekly Taxol x11 Grade 2 neuropathy  #right immediate breast reconstruction with placement of tissue expanders.  INTERVAL HISTORY Patient presents for Assessment prior to adjuvant chemotherapy.  #Chemotherapy, she tolerates Taxol well.  Denies any nausea vomiting denies any fever or chills. #Neuropathy, which has been getting worse.  She has started taking gabapentin 300 mg daily.  Has not feel any difference. assessment prior to be intact so for breast cancer treatment.  #Tachycardia in the palpitation: Stable.  Not worse.  She has upcoming cardiology appointment # Fatigue: Stable   Cancer TREATMENT Neoadjuvant ddAC +1 dose of Taxol, due to lack of response, surgery was offered. Case was discussed on breast tumor board. 03/19/2018 S/p right mastectomy and right axillary dissection, immediate breast reconstruction with placement of expanders. Also had elective bilateral salpingo-oophorectomy..  # Currently on adjuvant Taxol weekly, need to finish total of 11 cycles adjuvantly.  # .# She has obtained dental clearance for starting Zometa.  S/p Zometa on 6/3/ 2019   Review of Systems  Constitutional: Positive for malaise/fatigue. Negative for chills, diaphoresis, fever and weight loss.  HENT: Negative for congestion, ear  discharge, ear pain, hearing loss, nosebleeds, sinus pain, sore throat and tinnitus.   Eyes: Negative for blurred vision, double vision, photophobia, pain, discharge and redness.  Respiratory: Negative for cough, hemoptysis, sputum production, shortness of breath and wheezing.   Cardiovascular: Negative for chest pain, palpitations, orthopnea, claudication and leg swelling.       Fast heart rate.   Gastrointestinal: Negative for abdominal pain, blood in stool, constipation, diarrhea, heartburn, melena, nausea and vomiting.  Genitourinary: Negative for dysuria, flank pain, frequency, hematuria and urgency.  Musculoskeletal: Negative for back pain, myalgias and neck pain.  Skin: Negative for itching and rash.  Neurological: Positive for tingling. Negative for dizziness, tremors, speech change, focal weakness, weakness and headaches.  Endo/Heme/Allergies: Negative for environmental allergies and polydipsia. Does not bruise/bleed easily.  Psychiatric/Behavioral: Negative for depression, hallucinations and substance abuse. The patient is not nervous/anxious.     No Known Allergies  Patient Active Problem List   Diagnosis Date Noted  . Estrogen receptor positive status (ER+) 04/04/2018  . Breast cancer of upper-outer quadrant of right female breast ([facility] 03/19/2018  . Family history of cancer   . Malignant neoplasm of overlapping sites of right breast in female, estrogen receptor positive ([facility] [place] 10/19/2017     Past Medical History:  Diagnosis Date  . Anemia   . BRCA negative 11/26/2017  . Breast cancer ([facility] 10/11/2017   Multifocal, ER positive, PR negative, HER-2 negative.  . Chronic bronchitis ([facility] 11/2017  . COPD (chronic obstructive pulmonary disease) ([facility]    MILD PER CXR  . Depression   . Family history of cancer   . GERD (gastroesophageal reflux disease)   . Headache    MIGRAINES  . Heart murmur    ASYMPTOMATIC  . Personal history of chemotherapy  current for right breast  ca     Past Surgical History:  Procedure Laterality Date  . AXILLARY LYMPH NODE DISSECTION Right 03/19/2018   Procedure: AXILLARY LYMPH NODE DISSECTION;  Surgeon: Robert Bellow, MD;  Location: [facility] ORS;  Service: General;  Laterality: Right;  . BREAST BIOPSY Right 10/11/2017   12:30 posterior coil clip invasive mammary carcinoma  . BREAST BIOPSY Right 10/11/2017   11:30 middle depth ribbon clip DCIS  . BREAST BIOPSY Right 10/11/2017   5:30 anterior depth x shape invasive ductal carcinoma  . BREAST RECONSTRUCTION WITH PLACEMENT OF TISSUE EXPANDER AND FLEX HD (ACELLULAR HYDRATED DERMIS) Right 03/19/2018   Procedure: BREAST RECONSTRUCTION WITH PLACEMENT OF TISSUE EXPANDER AND FLEX HD (ACELLULAR HYDRATED DERMIS);  Surgeon: Wallace Going, DO;  Location: [facility] ORS;  Service: Plastics;  Laterality: Right;  . LAPAROSCOPIC BILATERAL SALPINGO OOPHERECTOMY Bilateral 03/19/2018   Procedure: LAPAROSCOPIC BILATERAL SALPINGO OOPHORECTOMY;  Surgeon: Benjaman Kindler, MD;  Location: [facility] ORS;  Service: Gynecology;  Laterality: Bilateral;  . MASTECTOMY W/ SENTINEL NODE BIOPSY Right 03/19/2018   Procedure: MASTECTOMY WITH SENTINEL LYMPH NODE BIOPSY;  Surgeon: Robert Bellow, MD;  Location: [facility] OR[facility];  Service: General;  Laterality: Right;  . PORTACATH PLACEMENT Left 10/24/2017   Procedure: INSERTION PORT-A-CATH;  Surgeon: Robert Bellow, MD;  Location: [facility] ORS;  Service: General;  Laterality: Left;    Social History   Socioeconomic History  . Marital status: Married    Spouse name: Not on file  . Number of children: Not on file  . Years of education: Not on file  . Highest education level: Not on file  Occupational History  . Occupation: Occupational psychologist    Comment: [employer]  . Financial resource strain: Not on file  . Food insecurity:    Worry: Not on file    Inability: Not on file  . Transportation needs:    Medical: Not on file     Non-medical: Not on file  Tobacco Use  . Smoking status: Current Every Day Smoker    Packs/day: 0.50    Years: 5.00    Pack years: 2.50    Types: Cigarettes    Last attempt to quit: 12/12/2017    Years since quitting: 0.4  . Smokeless tobacco: Never Used  Substance and Sexual Activity  . Alcohol use: No    Frequency: Never  . Drug use: No  . Sexual activity: Yes    Birth control/protection: Injection  Lifestyle  . Physical activity:    Days per week: Not on file    Minutes per session: Not on file  . Stress: Not on file  Relationships  . Social connections:    Talks on phone: Not on file    Gets together: Not on file    Attends religious service: Not on file    Active member of club or organization: Not on file    Attends meetings of clubs or organizations: Not on file    Relationship status: Not on file  . Intimate partner violence:    Fear of current or ex partner: Not on file    Emotionally abused: Not on file    Physically abused: Not on file    Forced sexual activity: Not on file  Other Topics Concern  . Not on file  Social History Narrative  . Not on file     Family History  Problem Relation Age of Onset  . Melanoma Maternal Aunt  other aunts with BCC/SCC/Melanoma  . Diabetes Father   . Hypertension Father   . Hyperlipidemia Father   . Bladder Cancer Maternal Grandmother   . Cervical cancer Maternal Aunt 64       daughter w/ cervical cancer as well  . Melanoma Maternal Uncle        other uncles with BCC/SCC/Melanoma     Current Outpatient Medications:  .  albuterol (PROVENTIL HFA;VENTOLIN HFA) 108 (90 Base) MCG/ACT inhaler, Inhale 2 puffs into the lungs every 6 (six) hours as needed for wheezing or shortness of breath (Cough)., Disp: 1 Inhaler, Rfl: 2 .  ALPRAZolam (XANAX) 0.25 MG tablet, Take 0.25 mg at bedtime as needed by mouth for anxiety or sleep. , Disp: , Rfl:  .  Calcium Carb-Cholecalciferol (CALCIUM 600 + D PO), Take 1 tablet daily by  mouth., Disp: , Rfl:  .  chlorhexidine (PERIDEX) 0.12 % solution, USE AS DIRECTED 15MLS IN THE MOUTH OR THROAT TWICE A DAY, Disp: 473 mL, Rfl: 1 .  doxylamine, Sleep, (UNISOM) 25 MG tablet, Take 25 mg by mouth at bedtime as needed for sleep., Disp: , Rfl:  .  escitalopram (LEXAPRO) 20 MG tablet, Take 20 mg by mouth every morning. , Disp: , Rfl:  .  esomeprazole (NEXIUM) 40 MG capsule, Take 40 mg by mouth every morning. , Disp: , Rfl:  .  gabapentin (NEURONTIN) 300 MG capsule, Take 1 capsule (300 mg total) by mouth 3 (three) times daily., Disp: 90 capsule, Rfl: 0 .  ibuprofen (ADVIL,MOTRIN) 800 MG tablet, Take 1 tablet (800 mg total) by mouth every 8 (eight) hours as needed for moderate pain., Disp: 30 tablet, Rfl: 1 .  lidocaine (XYLOCAINE) 2 % solution, Use as directed 10 mLs in the mouth or throat every 4 (four) hours as needed for mouth pain. (Patient not taking: Reported on 05/28/2018), Disp: 200 mL, Rfl: 0 .  lidocaine-prilocaine (EMLA) cream, Apply to affected area once (Patient not taking: Reported on 05/28/2018), Disp: 30 g, Rfl: 3 .  loratadine (CLARITIN) 10 MG tablet, Take 10 mg by mouth daily. , Disp: , Rfl:  .  prochlorperazine (COMPAZINE) 10 MG tablet, Take 1 tablet (10 mg total) by mouth every 6 (six) hours as needed (Nausea or vomiting)., Disp: 30 tablet, Rfl: 1 .  promethazine (PHENERGAN) 25 MG tablet, Take 1 tablet (25 mg total) by mouth every 6 (six) hours as needed for nausea or vomiting., Disp: 30 tablet, Rfl: 0 .  pyridOXINE (VITAMIN B-6) 100 MG tablet, Take 1 tablet (100 mg total) by mouth daily., Disp: 30 tablet, Rfl: 1 No current facility-administered medications for this visit.   Facility-Administered Medications Ordered in Other Visits:  .  heparin lock flush 100 unit/mL, 500 Units, Intravenous, Once, Earlie Server, MD   Physical exam:  Vitals:   06/11/18 0828  BP: 119/81  Pulse: (!) 108  Resp: 18  Temp: (!) 97 F (36.1 C)  TempSrc: Tympanic  Weight: 171 lb 14.4 oz (78  kg)  ECOG 0 Physical Exam  Constitutional: She is oriented to person, place, and time and well-developed, well-nourished, and in no distress. No distress.  HENT:  Head: Normocephalic and atraumatic.  Nose: Nose normal.  Mouth/Throat: Oropharynx is clear and moist. No oropharyngeal exudate.  No thrush,  Eyes: Pupils are equal, round, and reactive to light. Conjunctivae and EOM are normal. Left eye exhibits no discharge. No scleral icterus.  Neck: Normal range of motion. Neck supple. No JVD present.  Cardiovascular: Normal rate, regular  rhythm and normal heart sounds.  No murmur heard. Tachycardia  Pulmonary/Chest: Effort normal and breath sounds normal. No respiratory distress. She has no wheezes. She has no rales. She exhibits no tenderness.  Abdominal: Soft. Bowel sounds are normal. She exhibits no distension and no mass. There is no tenderness. There is no rebound.  Musculoskeletal: Normal range of motion. She exhibits no edema or tenderness.  Lymphadenopathy:    She has no cervical adenopathy.  Neurological: She is alert and oriented to person, place, and time. No cranial nerve deficit. She exhibits normal muscle tone.  Skin: Skin is warm and dry. No rash noted. She is not diaphoretic. No erythema.  Psychiatric: Mood, affect and judgment normal.     CMP Latest Ref Rng & Units 06/04/2018  Glucose 65 - 99 mg/dL 124(H)  BUN 6 - 20 mg/dL 8  Creatinine 0.44 - 1.00 mg/dL 0.67  Sodium 135 - 145 mmol/L 138  Potassium 3.5 - 5.1 mmol/L 3.6  Chloride 101 - 111 mmol/L 108  CO2 22 - 32 mmol/L 23  Calcium 8.9 - 10.3 mg/dL 8.7(L)  Total Protein 6.5 - 8.1 g/dL 7.0  Total Bilirubin 0.3 - 1.2 mg/dL 0.5  Alkaline Phos 38 - 126 U/L 86  AST 15 - 41 U/L 27  ALT 14 - 54 U/L 22   CBC Latest Ref Rng & Units 06/04/2018  WBC 3.6 - 11.0 K/uL 8.1  Hemoglobin 12.0 - 16.0 g/dL 11.3(L)  Hematocrit 35.0 - 47.0 % 32.3(L)  Platelets 150 - 440 K/uL 316    Assessment and plan- Patient is a 37 y.o. female  presents for evaluation of newly diagnosed multicentric right breast cancer.  Cancer Staging Breast cancer of upper-outer quadrant of right female breast [facility]) Staging form: Breast, AJCC 8th Edition - Clinical: G2, ER+, PR+, HER2- - Signed by Earlie Server, MD on 04/05/2018 - Pathologic stage from 04/04/2018: No Stage Recommended (ypT3, pN2, cM0, G3, ER+, PR-, HER2-) - Signed by Earlie Server, MD on 04/04/2018     1. Encounter for antineoplastic chemotherapy   2. Malignant neoplasm of overlapping sites of right breast in female, estrogen receptor positive ([place])   3. Other fatigue   4. Neuropathy due to chemotherapeutic drug (H[facility])     #Breast cancer, counts acceptable.  Proceed with cycle 12 weekly Taxol.  Reduce Taxol given her worsening neuropathy. Follow-up with radiation oncology and start radiation.   #Neuropathy secondary to chemotherapy drugs, advised patient to escalate gabapentin 300 mg twice a day for 3 days and for increase to 300 mg 3 times a day.  She also takes vitamin B6. After she finished chemotherapy and radiation, she may be eligible for Natalee trial.  Previously referred to clinical trial coordinator.  # Tachycardia and SOB: Stable.  Continue to monitor. #Fatigue: Stable #Anemia due to anti-neoplasm treatment: Stable continue to monitor..    # bilateral oophorectomy, after she completes chemoradiation, plan Aromatase inhibitor along with NATALEE trial.   Follow up in 6 weeks  Earlie Server, MD, PhD Hematology Oncology [facility] at [facility] Pager- [phone] 06/11/18

## 2018-06-11 NOTE — Progress Notes (Signed)
Patient here for follow up. No concerns voiced.  °

## 2018-06-18 ENCOUNTER — Other Ambulatory Visit: Payer: BLUE CROSS/BLUE SHIELD

## 2018-06-18 ENCOUNTER — Ambulatory Visit: Payer: BLUE CROSS/BLUE SHIELD

## 2018-06-25 ENCOUNTER — Telehealth: Payer: Self-pay | Admitting: *Deleted

## 2018-06-25 NOTE — Telephone Encounter (Signed)
Insurance company called requesting the date of patients last chemotherapy and her current physical status.

## 2018-07-02 ENCOUNTER — Other Ambulatory Visit: Payer: Self-pay | Admitting: Oncology

## 2018-07-02 ENCOUNTER — Encounter: Payer: Self-pay | Admitting: Oncology

## 2018-07-02 MED ORDER — LETROZOLE 2.5 MG PO TABS: 2.5000 mg | ORAL_TABLET | Freq: Every day | ORAL | 0 refills | Status: DC

## 2018-07-02 MED ORDER — GABAPENTIN 300 MG PO CAPS
300.0000 mg | ORAL_CAPSULE | Freq: Three times a day (TID) | ORAL | 0 refills | Status: DC
Start: 2018-07-02 — End: 2018-08-07

## 2018-07-04 ENCOUNTER — Ambulatory Visit
Admission: RE | Admit: 2018-07-04 | Discharge: 2018-07-04 | Disposition: A | Discharge status: Home / Self Care | Payer: BLUE CROSS/BLUE SHIELD | Source: Ambulatory Visit | Attending: Radiation Oncology | Admitting: Radiation Oncology

## 2018-07-04 ENCOUNTER — Encounter: Payer: Self-pay | Admitting: Radiation Oncology

## 2018-07-04 ENCOUNTER — Other Ambulatory Visit: Payer: Self-pay

## 2018-07-04 VITALS — BP 127/77 | HR 98 | Temp 97.8°F | Resp 18 | Ht 67.0 in | Wt 172.1 lb

## 2018-07-04 DIAGNOSIS — K219 Gastro-esophageal reflux disease without esophagitis: Secondary | ICD-10-CM | POA: Diagnosis not present

## 2018-07-04 DIAGNOSIS — C50811 Malignant neoplasm of overlapping sites of right female breast: Secondary | ICD-10-CM | POA: Diagnosis not present

## 2018-07-04 DIAGNOSIS — F17210 Nicotine dependence, cigarettes, uncomplicated: Secondary | ICD-10-CM | POA: Insufficient documentation

## 2018-07-04 DIAGNOSIS — Z170 Estrogen receptor positive status [ER+]: Secondary | ICD-10-CM | POA: Diagnosis not present

## 2018-07-04 DIAGNOSIS — Z79899 Other long term (current) drug therapy: Secondary | ICD-10-CM | POA: Insufficient documentation

## 2018-07-04 DIAGNOSIS — Z9221 Personal history of antineoplastic chemotherapy: Secondary | ICD-10-CM | POA: Insufficient documentation

## 2018-07-04 DIAGNOSIS — J449 Chronic obstructive pulmonary disease, unspecified: Secondary | ICD-10-CM | POA: Insufficient documentation

## 2018-07-04 NOTE — Consult Note (Signed)
NEW PATIENT EVALUATION  Name: Theresa Chaney  MRN: [id number]  Date:   07/04/2018     DOB: [birth year]   This 37 y.o. female patient presents to the clinic for initial evaluation of stage IIIa (PT 3 PN2 cM0ER positive PR negative HER-2/neu negative invasive mammary carcinoma status post chemotherapy and right modified radical mastectomy for evaluation of adjuvant right chest wall and peripheral lymphatic radiation.  REFERRING PHYSICIAN: Juluis Pitch, MD  CHIEF COMPLAINT:  Chief Complaint  Patient presents with  . Breast Cancer    Pt is here for initial consult of breast cancer    DIAGNOSIS: The encounter diagnosis was Malignant neoplasm of overlapping sites of right breast in female, estrogen receptor positive ([place]).   PREVIOUS INVESTIGATIONS:  Pathology reports reviewed Clinical notes reviewed Bone scan CT scans mammograms and ultrasound all reviewed   HPI: patient is a 37 year old female who presented with a self discovered large right breast mass.mammogram confirmed a wide stance of abnormality in the right breast both on mammogram and ultrasound. There is architectural distortion the upper right breast as well as multifocal areas of architectural distortion highly suspicious for carcinoma. Stereotactic biopsy was performed in October 2018 showing high-grade ductal carcinoma in situ as well as invasive carcinoma with diffusely infiltrative pattern lymphovascular invasion. Tumor was ER/PR positive PR negative HER-2/neu not overexpressed. She underwent neoadjuvant chemotherapy although tumor did not respond well to treatment and she underwent a a right modified radical mastectomy with axillary lymph node dissection as well as bilateral oophorectomy.tumor measured 8.7 cm with margins closeat 1-2 mm from the deep margin. Tumor was overall grade 3 ductal carcinoma in situ was present dermal lymphovascular invasion was present. 416 axillary lymph nodes were positive for metastatic  disease.She's gone on to have adjuvant chemotherapy including 11 cycles of Taxol. She initially had dose dense before meals as new adjuvant therapy. She is orally had expanders placed in her right chest wall and is set to have reconstructive surgery in the beginning of August. She seen today for radiation oncology opinion. She specifically denies any bone pain cough. She's having no swelling in her right upper extremity. PLANNED TREATMENT REGIMEN: right chest wall and peripheral lymphatic radiation  PAST MEDICAL HISTORY:  has a past medical history of Anemia, BRCA negative (11/26/2017), Breast cancer ([place]) (10/11/2017), Chronic bronchitis ([place]) (11/2017), COPD (chronic obstructive pulmonary disease) ([place]), Depression, Family history of cancer, GERD (gastroesophageal reflux disease), Headache, Heart murmur, and Personal history of chemotherapy.    PAST SURGICAL HISTORY:  Past Surgical History:  Procedure Laterality Date  . AXILLARY LYMPH NODE DISSECTION Right 03/19/2018   Procedure: AXILLARY LYMPH NODE DISSECTION;  Surgeon: Robert Bellow, MD;  Location: [facility];  Service: General;  Laterality: Right;  . BREAST BIOPSY Right 10/11/2017   12:30 posterior coil clip invasive mammary carcinoma  . BREAST BIOPSY Right 10/11/2017   11:30 middle depth ribbon clip DCIS  . BREAST BIOPSY Right 10/11/2017   5:30 anterior depth x shape invasive ductal carcinoma  . BREAST RECONSTRUCTION WITH PLACEMENT OF TISSUE EXPANDER AND FLEX HD (ACELLULAR HYDRATED DERMIS) Right 03/19/2018   Procedure: BREAST RECONSTRUCTION WITH PLACEMENT OF TISSUE EXPANDER AND FLEX HD (ACELLULAR HYDRATED DERMIS);  Surgeon: Wallace Going, DO;  Location: [facility];  Service: Plastics;  Laterality: Right;  . LAPAROSCOPIC BILATERAL SALPINGO OOPHERECTOMY Bilateral 03/19/2018   Procedure: LAPAROSCOPIC BILATERAL SALPINGO OOPHORECTOMY;  Surgeon: Benjaman Kindler, MD;  Location: [facility];  Service: Gynecology;  Laterality: Bilateral;  .  MASTECTOMY W/ SENTINEL NODE BIOPSY Right  03/19/2018   Procedure: MASTECTOMY WITH SENTINEL LYMPH NODE BIOPSY;  Surgeon: Robert Bellow, MD;  Location: [facility];  Service: General;  Laterality: Right;  . PORTACATH PLACEMENT Left 10/24/2017   Procedure: INSERTION PORT-A-CATH;  Surgeon: Robert Bellow, MD;  Location: [facility];  Service: General;  Laterality: Left;    FAMILY HISTORY: family history includes Bladder Cancer in her maternal grandmother; Cervical cancer (age of onset: 47) in her maternal aunt; Diabetes in her father; Hyperlipidemia in her father; Hypertension in her father; Melanoma in her maternal aunt and maternal uncle.  SOCIAL HISTORY:  reports that she has been smoking cigarettes.  She has a 2.50 pack-year smoking history. She has never used smokeless tobacco. She reports that she does not drink alcohol or use drugs.  ALLERGIES: Patient has no known allergies.  MEDICATIONS:  Current Outpatient Medications  Medication Sig Dispense Refill  . albuterol (PROVENTIL HFA;VENTOLIN HFA) 108 (90 Base) MCG/ACT inhaler Inhale 2 puffs into the lungs every 6 (six) hours as needed for wheezing or shortness of breath (Cough). 1 Inhaler 2  . ALPRAZolam (XANAX) 0.25 MG tablet Take 0.25 mg at bedtime as needed by mouth for anxiety or sleep.     . Calcium Carb-Cholecalciferol (CALCIUM 600 + D PO) Take 1 tablet daily by mouth.    . chlorhexidine (PERIDEX) 0.12 % solution USE AS DIRECTED 15MLS IN THE MOUTH OR THROAT TWICE A DAY 473 mL 1  . doxylamine, Sleep, (UNISOM) 25 MG tablet Take 25 mg by mouth at bedtime as needed for sleep.    [name] escitalopram (LEXAPRO) 20 MG tablet Take 20 mg by mouth every morning.     [name] esomeprazole (NEXIUM) 40 MG capsule Take 40 mg by mouth every morning.     . gabapentin (NEURONTIN) 300 MG capsule Take 1 capsule (300 mg total) by mouth 3 (three) times daily. 270 capsule 0  . ibuprofen (ADVIL,MOTRIN) 800 MG tablet Take 1 tablet (800 mg total) by mouth every 8 (eight)  hours as needed for moderate pain. 30 tablet 1  . letrozole (FEMARA) 2.5 MG tablet Take 1 tablet (2.5 mg total) by mouth daily. 90 tablet 0  . lidocaine (XYLOCAINE) 2 % solution Use as directed 10 mLs in the mouth or throat every 4 (four) hours as needed for mouth pain. 200 mL 0  . lidocaine-prilocaine (EMLA) cream Apply to affected area once 30 g 3  . loratadine (CLARITIN) 10 MG tablet Take 10 mg by mouth daily.     . potassium chloride (KLOR-CON) 20 MEQ packet Take 20 mEq by mouth 4 (four) times daily.    . prochlorperazine (COMPAZINE) 10 MG tablet Take 1 tablet (10 mg total) by mouth every 6 (six) hours as needed (Nausea or vomiting). 30 tablet 1  . promethazine (PHENERGAN) 25 MG tablet Take 1 tablet (25 mg total) by mouth every 6 (six) hours as needed for nausea or vomiting. 30 tablet 0  . pyridOXINE (VITAMIN B-6) 100 MG tablet Take 1 tablet (100 mg total) by mouth daily. 30 tablet 1   No current facility-administered medications for this encounter.     ECOG PERFORMANCE STATUS:  0 - Asymptomatic  REVIEW OF SYSTEMS:  Patient denies any weight loss, fatigue, weakness, fever, chills or night sweats. Patient denies any loss of vision, blurred vision. Patient denies any ringing  of the ears or hearing loss. No irregular heartbeat. Patient denies heart murmur or history of fainting. Patient denies any chest pain or pain radiating to her upper  extremities. Patient denies any shortness of breath, difficulty breathing at night, cough or hemoptysis. Patient denies any swelling in the lower legs. Patient denies any nausea vomiting, vomiting of blood, or coffee ground material in the vomitus. Patient denies any stomach pain. Patient states has had normal bowel movements no significant constipation or diarrhea. Patient denies any dysuria, hematuria or significant nocturia. Patient denies any problems walking, swelling in the joints or loss of balance. Patient denies any skin changes, loss of hair or loss of  weight. Patient denies any excessive worrying or anxiety or significant depression. Patient denies any problems with insomnia. Patient denies excessive thirst, polyuria, polydipsia. Patient denies any swollen glands, patient denies easy bruising or easy bleeding. Patient denies any recent infections, allergies or URI. Patient "s visual fields have not changed significantly in recent time.    PHYSICAL EXAM: BP 127/77   Pulse 98   Temp 97.8 F (36.6 C)   Resp 18   Ht 5' 7" (1.702 m)   Wt 172 lb 1.1 oz (78 kg)   LMP  (LMP Unknown) Comment: Ovaries and tubes removed.  BMI 26.95 kg/m  Left breast is free of dominant mass or nodularity in 2 positions examined she has expanders in the right chest wall no evidence of mass or nodularity the skin axilla or supraclavicular region is noted.Well-developed well-nourished patient in NAD. HEENT reveals PERLA, EOMI, discs not visualized.  Oral cavity is clear. No oral mucosal lesions are identified. Neck is clear without evidence of cervical or supraclavicular adenopathy. Lungs are clear to A&P. Cardiac examination is essentially unremarkable with regular rate and rhythm without murmur rub or thrill. Abdomen is benign with no organomegaly or masses noted. Motor sensory and DTR levels are equal and symmetric in the upper and lower extremities. Cranial nerves II through XII are grossly intact. Proprioception is intact. No peripheral adenopathy or edema is identified. No motor or sensory levels are noted. Crude visual fields are within normal range.  LABORATORY DATA: pathology reports reviewed    RADIOLOGY RESULTS:cT scans bone scan mammogram and ultrasound all reviewed and compatible with the above-stated findings   IMPRESSION: tage III (T3 N2 pathologically invasive mammary carcinoma the right breast status post neoadjuvant chemotherapy right modified radical mastectomy and axillary dissection as well as bilateral salpingo-oophorectomy in 37 year old female now  for adjuvant radiation.  PLAN: at this time like to go ahead with adjuvant radiation therapy to her right chest wall peripheral lymphatics. I will see her after proximal me 3 weeks after her breast reconstruction for evaluation and whether we can start treatment planning for her right chest wall peripheral lymphatics. Would plan on delivering 5040 cGy in 28 fractions. Depending on CT findings may also boost the posterior margin if we can determine the exact area from her previous CT scans to target that area. Risks and benefits of treatment including skin reaction fatigue alteration of blood counts possible inclusion of superficial lung and slight chance of lymphedema of her right upper extremity were all discussed in detail. I've emphasized she needs to exercise right upper extremity is much is possible to prevent lymphedema. I've set up a follow-up appointment after her surgery and we'll reevaluate her for simulation at that time. Patient is to call with any concerns.  I would like to take this opportunity to thank you for allowing me to participate in the care of your patient.Noreene Filbert, MD

## 2018-07-10 ENCOUNTER — Encounter: Payer: Self-pay | Admitting: General Surgery

## 2018-07-10 ENCOUNTER — Ambulatory Visit (INDEPENDENT_AMBULATORY_CARE_PROVIDER_SITE_OTHER): Payer: BLUE CROSS/BLUE SHIELD | Admitting: General Surgery

## 2018-07-10 VITALS — BP 118/70 | HR 96 | Resp 14 | Ht 67.0 in | Wt 171.0 lb

## 2018-07-10 DIAGNOSIS — Z170 Estrogen receptor positive status [ER+]: Secondary | ICD-10-CM

## 2018-07-10 DIAGNOSIS — C50811 Malignant neoplasm of overlapping sites of right female breast: Secondary | ICD-10-CM

## 2018-07-10 NOTE — Progress Notes (Signed)
Patient ID: Theresa Chaney, female   DOB: [birth year], 37 y.o.   MRN: [id number]  Chief Complaint  Patient presents with  . Routine Post Op    HPI Theresa Chaney is a 37 y.o. female.  Here for postoperative visit, right mastectomy with reconstruction on 03-19-18. She has up coming surgery with Dr Marla Roe 07-20-18, she has the expander in place and plans on having the implant placed. Patient states after her implant she has to get radiation.  She is here with her daughter, 70.  HPI  Past Medical History:  Diagnosis Date  . Anemia   . BRCA negative 11/26/2017  . Breast cancer ([place]) 10/11/2017   Multifocal, ER positive, PR negative, HER-2 negative.  . Chronic bronchitis ([place]) 11/2017  . COPD (chronic obstructive pulmonary disease) ([place])    MILD PER CXR  . Depression   . Family history of cancer   . GERD (gastroesophageal reflux disease)   . Headache    MIGRAINES  . Heart murmur    ASYMPTOMATIC  . Personal history of chemotherapy    current for right breast ca    Past Surgical History:  Procedure Laterality Date  . AXILLARY LYMPH NODE DISSECTION Right 03/19/2018   Procedure: AXILLARY LYMPH NODE DISSECTION;  Surgeon: Robert Bellow, MD;  Location: [facility];  Service: General;  Laterality: Right;  . BREAST BIOPSY Right 10/11/2017   12:30 posterior coil clip invasive mammary carcinoma  . BREAST BIOPSY Right 10/11/2017   11:30 middle depth ribbon clip DCIS  . BREAST BIOPSY Right 10/11/2017   5:30 anterior depth x shape invasive ductal carcinoma  . BREAST RECONSTRUCTION WITH PLACEMENT OF TISSUE EXPANDER AND FLEX HD (ACELLULAR HYDRATED DERMIS) Right 03/19/2018   Procedure: BREAST RECONSTRUCTION WITH PLACEMENT OF TISSUE EXPANDER AND FLEX HD (ACELLULAR HYDRATED DERMIS);  Surgeon: Wallace Going, DO;  Location: [facility];  Service: Plastics;  Laterality: Right;  . LAPAROSCOPIC BILATERAL SALPINGO OOPHERECTOMY Bilateral 03/19/2018   Procedure: LAPAROSCOPIC BILATERAL SALPINGO  OOPHORECTOMY;  Surgeon: Benjaman Kindler, MD;  Location: [facility];  Service: Gynecology;  Laterality: Bilateral;  . MASTECTOMY W/ SENTINEL NODE BIOPSY Right 03/19/2018   Procedure: MASTECTOMY WITH SENTINEL LYMPH NODE BIOPSY;  Surgeon: Robert Bellow, MD;  Location: [facility];  Service: General;  Laterality: Right;  . PORTACATH PLACEMENT Left 10/24/2017   Procedure: INSERTION PORT-A-CATH;  Surgeon: Robert Bellow, MD;  Location: [facility];  Service: General;  Laterality: Left;    Family History  Problem Relation Age of Onset  . Melanoma Maternal Aunt        other aunts with BCC/SCC/Melanoma  . Diabetes Father   . Hypertension Father   . Hyperlipidemia Father   . Heart attack Father 60       "mild"  . Bladder Cancer Maternal Grandmother   . Cervical cancer Maternal Aunt 64       daughter w/ cervical cancer as well  . Melanoma Maternal Uncle        other uncles with BCC/SCC/Melanoma    Social History Social History   Tobacco Use  . Smoking status: Current Every Day Smoker    Packs/day: 0.50    Years: 18.00    Pack years: 9.00    Types: Cigarettes    Last attempt to quit: 12/12/2017    Years since quitting: 0.5  . Smokeless tobacco: Never Used  Substance Use Topics  . Alcohol use: No    Frequency: Never  . Drug use: No    No Known Allergies  Current Outpatient Medications  Medication Sig Dispense Refill  . albuterol (PROVENTIL HFA;VENTOLIN HFA) 108 (90 Base) MCG/ACT inhaler Inhale 2 puffs into the lungs every 6 (six) hours as needed for wheezing or shortness of breath (Cough). 1 Inhaler 2  . ALPRAZolam (XANAX) 0.25 MG tablet Take 0.25 mg at bedtime as needed by mouth for anxiety or sleep.     . Calcium Carb-Cholecalciferol (CALCIUM 600 + D PO) Take 1 tablet daily by mouth.    . doxylamine, Sleep, (UNISOM) 25 MG tablet Take 25 mg by mouth at bedtime as needed for sleep.    [name] escitalopram (LEXAPRO) 20 MG tablet Take 20 mg by mouth every morning.     [name] esomeprazole  (NEXIUM) 40 MG capsule Take 40 mg by mouth every morning.     . gabapentin (NEURONTIN) 300 MG capsule Take 1 capsule (300 mg total) by mouth 3 (three) times daily. 270 capsule 0  . ibuprofen (ADVIL,MOTRIN) 800 MG tablet Take 1 tablet (800 mg total) by mouth every 8 (eight) hours as needed for moderate pain. 30 tablet 1  . letrozole (FEMARA) 2.5 MG tablet Take 1 tablet (2.5 mg total) by mouth daily. 90 tablet 0  . loratadine (CLARITIN) 10 MG tablet Take 10 mg by mouth daily.     . prochlorperazine (COMPAZINE) 10 MG tablet Take 1 tablet (10 mg total) by mouth every 6 (six) hours as needed (Nausea or vomiting). 30 tablet 1  . promethazine (PHENERGAN) 25 MG tablet Take 1 tablet (25 mg total) by mouth every 6 (six) hours as needed for nausea or vomiting. 30 tablet 0  . pyridOXINE (VITAMIN B-6) 100 MG tablet Take 1 tablet (100 mg total) by mouth daily. 30 tablet 1  . chlorhexidine (PERIDEX) 0.12 % solution USE AS DIRECTED 15MLS IN THE MOUTH OR THROAT TWICE A DAY 473 mL 1  . lidocaine (XYLOCAINE) 2 % solution Use as directed 10 mLs in the mouth or throat every 4 (four) hours as needed for mouth pain. 200 mL 0  . lidocaine-prilocaine (EMLA) cream Apply to affected area once 30 g 3   No current facility-administered medications for this visit.     Review of Systems Review of Systems  Constitutional: Negative.   Respiratory: Negative.   Cardiovascular: Negative.     Blood pressure 118/70, pulse 96, resp. rate 14, height 5' 7"  (1.702 m), weight 171 lb (77.6 kg).  Physical Exam Physical Exam  Constitutional: She is oriented to person, place, and time. She appears well-developed and well-nourished.  Cardiovascular: Normal rate, regular rhythm and normal heart sounds.  Pulmonary/Chest: Effort normal and breath sounds normal.    Lymphadenopathy:    She has no axillary adenopathy.       Right: No supraclavicular adenopathy present.  Neurological: She is alert and oriented to person, place, and  time.  Skin: Skin is warm and dry.  Excellent shoulder range of motion.     Assessment       Doing well post mastectomy with immediate reconstruction, for final implant replacement next week.  Plan  Patient to have a left diagnotic mammogram in October 2019. The patient is aware to call back for any questions or concerns.   HPI, Physical Exam, Assessment and Plan have been scribed under the direction and in the presence of Hervey Ard, MD.  Gaspar Cola, CMA  I have completed the exam and reviewed the above documentation for accuracy and completeness.  I agree with the above.  Haematologist has been  used and any errors in dictation or transcription are unintentional.  Hervey Ard, M.D., F.A.C.S.  Theresa Chaney 07/11/2018, 9:55 PM

## 2018-07-10 NOTE — Progress Notes (Signed)
New Outpatient Visit Date: 07/11/2018  Referring Provider: Earlie Server, MD [place]  Chief Complaint: Elevated heart rate  HPI:  Theresa Chaney is a 37 y.o. female who is being seen today for the evaluation of tachycardia and shortness of breath in the setting of breast cancer and previous Adriamycin exposure at the request of Dr. Tasia Catchings.  She has a history of breast cancer status post mastectomy and chemotherapy.  She underwent transthoracic echocardiograms in 10/2017 and 04/2018, both of which showed normal LVEF.  Today, Theresa Chaney reports feeling well other than chronic shortness of breath with modest activity that began around the time of her mastectomy and chemotherapy in April.  Dyspnea has not gotten any worse, though she gets out of breath when mopping at home or walking for extended periods.  She has also been told that her heart rate is high at doctor's visits, though she is usually asymptomatic.  At times, when she feels that her heart might be beating fast, her pulse rate is actually lower than usual, near 60 bpm.  Theresa Chaney denies a history of prior cardiac disease.  She has not had any chest pain, palpitations, lightheadedness, orthopnea, or PND.  She notes occasional swelling in her hands and feet.  She is planning to undergo breast reconstruction next week followed by radiation therapy to begin the second week of September.  She completed chemotherapy on 06/11/2018.  Theresa Chaney continues to smoke but is in the process of trying to quit.  She has used nicotine replacement and Chantix in the past, but did not have success with either.  --------------------------------------------------------------------------------------------------  Cardiovascular History & Procedures: Cardiovascular Problems:  Shortness of breath  Elevated heart rate  Risk Factors:  None  Cath/PCI:  None  CV Surgery:  None  EP Procedures and Devices:  None  Non-Invasive Evaluation(s):  TTE  (04/26/2018): Normal LV size and wall thickness.  LVEF 55 to 60% with normal wall motion.  Normal diastolic function.  Normal RV size and function.  No significant valvular abnormalities.  TTE (10/26/2017): Normal LV size.  LVEF 60 to 65% with normal wall motion.  Grade 1 diastolic dysfunction.  Normal RV size and function.  Normal pulmonary artery pressure.  Recent CV Pertinent Labs: Lab Results  Component Value Date   INR 1.04 12/11/2017   K 3.7 06/11/2018   BUN 12 06/11/2018   CREATININE 0.64 06/11/2018    --------------------------------------------------------------------------------------------------  Past Medical History:  Diagnosis Date  . Anemia   . BRCA negative 11/26/2017  . Breast cancer ([place]) 10/11/2017   Multifocal, ER positive, PR negative, HER-2 negative.  . Chronic bronchitis ([place]) 11/2017  . COPD (chronic obstructive pulmonary disease) ([place])    MILD PER CXR  . Depression   . Family history of cancer   . GERD (gastroesophageal reflux disease)   . Headache    MIGRAINES  . Heart murmur    ASYMPTOMATIC  . Personal history of chemotherapy    current for right breast ca    Past Surgical History:  Procedure Laterality Date  . AXILLARY LYMPH NODE DISSECTION Right 03/19/2018   Procedure: AXILLARY LYMPH NODE DISSECTION;  Surgeon: Robert Bellow, MD;  Location: [facility];  Service: General;  Laterality: Right;  . BREAST BIOPSY Right 10/11/2017   12:30 posterior coil clip invasive mammary carcinoma  . BREAST BIOPSY Right 10/11/2017   11:30 middle depth ribbon clip DCIS  . BREAST BIOPSY Right 10/11/2017   5:30 anterior depth x shape invasive ductal  carcinoma  . BREAST RECONSTRUCTION WITH PLACEMENT OF TISSUE EXPANDER AND FLEX HD (ACELLULAR HYDRATED DERMIS) Right 03/19/2018   Procedure: BREAST RECONSTRUCTION WITH PLACEMENT OF TISSUE EXPANDER AND FLEX HD (ACELLULAR HYDRATED DERMIS);  Surgeon: Wallace Going, DO;  Location: [facility];  Service: Plastics;  Laterality:  Right;  . LAPAROSCOPIC BILATERAL SALPINGO OOPHERECTOMY Bilateral 03/19/2018   Procedure: LAPAROSCOPIC BILATERAL SALPINGO OOPHORECTOMY;  Surgeon: Benjaman Kindler, MD;  Location: [facility];  Service: Gynecology;  Laterality: Bilateral;  . MASTECTOMY W/ SENTINEL NODE BIOPSY Right 03/19/2018   Procedure: MASTECTOMY WITH SENTINEL LYMPH NODE BIOPSY;  Surgeon: Robert Bellow, MD;  Location: [facility];  Service: General;  Laterality: Right;  . PORTACATH PLACEMENT Left 10/24/2017   Procedure: INSERTION PORT-A-CATH;  Surgeon: Robert Bellow, MD;  Location: [facility];  Service: General;  Laterality: Left;    Current Meds  Medication Sig  . albuterol (PROVENTIL HFA;VENTOLIN HFA) 108 (90 Base) MCG/ACT inhaler Inhale 2 puffs into the lungs every 6 (six) hours as needed for wheezing or shortness of breath (Cough).  . ALPRAZolam (XANAX) 0.25 MG tablet Take 0.25 mg at bedtime as needed by mouth for anxiety or sleep.   . Calcium Carb-Cholecalciferol (CALCIUM 600 + D PO) Take 1 tablet daily by mouth.  . chlorhexidine (PERIDEX) 0.12 % solution USE AS DIRECTED 15MLS IN THE MOUTH OR THROAT TWICE A DAY  . doxylamine, Sleep, (UNISOM) 25 MG tablet Take 25 mg by mouth at bedtime as needed for sleep.  [name] escitalopram (LEXAPRO) 20 MG tablet Take 20 mg by mouth every morning.   [name] esomeprazole (NEXIUM) 40 MG capsule Take 40 mg by mouth every morning.   . gabapentin (NEURONTIN) 300 MG capsule Take 1 capsule (300 mg total) by mouth 3 (three) times daily.  [name] ibuprofen (ADVIL,MOTRIN) 800 MG tablet Take 1 tablet (800 mg total) by mouth every 8 (eight) hours as needed for moderate pain.  [name] letrozole (FEMARA) 2.5 MG tablet Take 1 tablet (2.5 mg total) by mouth daily.  [name] lidocaine (XYLOCAINE) 2 % solution Use as directed 10 mLs in the mouth or throat every 4 (four) hours as needed for mouth pain.  [name] lidocaine-prilocaine (EMLA) cream Apply to affected area once  . loratadine (CLARITIN) 10 MG tablet Take 10 mg by mouth daily.   .  prochlorperazine (COMPAZINE) 10 MG tablet Take 1 tablet (10 mg total) by mouth every 6 (six) hours as needed (Nausea or vomiting).  . promethazine (PHENERGAN) 25 MG tablet Take 1 tablet (25 mg total) by mouth every 6 (six) hours as needed for nausea or vomiting.  . pyridOXINE (VITAMIN B-6) 100 MG tablet Take 1 tablet (100 mg total) by mouth daily.    Allergies: Patient has no known allergies.  Social History   Tobacco Use  . Smoking status: Current Every Day Smoker    Packs/day: 0.50    Years: 5.00    Pack years: 2.50    Types: Cigarettes    Last attempt to quit: 12/12/2017    Years since quitting: 0.5  . Smokeless tobacco: Never Used  Substance Use Topics  . Alcohol use: No    Frequency: Never  . Drug use: No    Family History  Problem Relation Age of Onset  . Melanoma Maternal Aunt        other aunts with BCC/SCC/Melanoma  . Diabetes Father   . Hypertension Father   . Hyperlipidemia Father   . Bladder Cancer Maternal Grandmother   . Cervical cancer Maternal Aunt  62       daughter w/ cervical cancer as well  . Melanoma Maternal Uncle        other uncles with BCC/SCC/Melanoma    Review of Systems: A 12-system review of systems was performed and was negative except as noted in the HPI.  --------------------------------------------------------------------------------------------------  Physical Exam: BP 110/66 (BP Location: Left Arm, Patient Position: Sitting, Cuff Size: Normal)   Pulse 93   Ht _0  (1.702 m)   Wt 172 lb (78 kg)   LMP  (LMP Unknown) Comment: Ovaries and tubes removed.  BMI 26.94 kg/m   General: NAD. HEENT: No conjunctival pallor or scleral icterus. Moist mucous membranes. OP clear. Neck: Supple without lymphadenopathy, thyromegaly, JVD, or HJR. Lungs: Normal work of breathing. Clear to auscultation bilaterally without wheezes or crackles. Heart: Regular rate and rhythm without murmurs, rubs, or gallops. Non-displaced PMI. Abd: Bowel sounds  present. Soft, NT/ND without hepatosplenomegaly Ext: No lower extremity edema. Radial, PT, and DP pulses are 2+ bilaterally Skin: Warm and dry without rash. Neuro: CNIII-XII intact. Strength and fine-touch sensation intact in upper and lower extremities bilaterally. Psych: Normal mood and affect.  EKG: Normal sinus rhythm with possible left atrial enlargement.  Heart rate 93 bpm.  No significant abnormalities.  Lab Results  Component Value Date   WBC 8.1 06/11/2018   HGB 11.2 (L) 06/11/2018   HCT 32.4 (L) 06/11/2018   MCV 91.3 06/11/2018   PLT 288 06/11/2018    Lab Results  Component Value Date   NA 137 06/11/2018   K 3.7 06/11/2018   CL 105 06/11/2018   CO2 22 06/11/2018   BUN 12 06/11/2018   CREATININE 0.64 06/11/2018   GLUCOSE 135 (H) 06/11/2018   ALT 25 06/11/2018    No results found for: CHOL, HDL, LDLCALC, LDLDIRECT, TRIG, CHOLHDL   --------------------------------------------------------------------------------------------------  ASSESSMENT AND PLAN: [place] This has been incidentally noted at past physician visits.  Heart rate today is normal at 93 bpm.  Two prior echocardiograms as recently as May have not shown any structural abnormalities.  Theresa Chaney appears euvolemic.  We discussed the possibility that elevated heart rate may be related to physiologic effects from her cancer and chemotherapy.  Pulmonary embolism is also a consideration, but given stable symptoms for several months and lack of chest pain, I think this is relatively unlikely.  Additionally, CT of the chest with contrast (albeit not a CTA) in late April did not show evidence of a control pulmonary embolism; We have agreed to defer any work-up at this time.  Shortness of breath Chronic over the last several months following mastectomy and chemotherapy.  Symptoms are not worsening.  Theresa Chaney appears euvolemic on exam today.  Echo showed no structural abnormalities.  EKG does not show any significant  abnormalities either.  We have agreed to defer additional work-up for now.  If her shortness of breath worsens, she should contact us for reevaluation.  I have a low suspicion for coronary insufficiency as underlying cause; personal review of chest CT 04/06/2018 showed no significant coronary artery calcification.  Follow-up: Return to clinic first week of September to reassess heart rate and shortness of breath.  Nelva Bush, MD 07/11/2018 9:43 AM

## 2018-07-10 NOTE — Patient Instructions (Signed)
  Patient to have a left diagnotic mammogram in October 2019. The patient is aware to call back for any questions or concerns.

## 2018-07-11 ENCOUNTER — Ambulatory Visit (INDEPENDENT_AMBULATORY_CARE_PROVIDER_SITE_OTHER): Payer: BLUE CROSS/BLUE SHIELD | Admitting: Internal Medicine

## 2018-07-11 ENCOUNTER — Encounter: Payer: Self-pay | Admitting: Internal Medicine

## 2018-07-11 VITALS — BP 110/66 | HR 93 | Ht 67.0 in | Wt 172.0 lb

## 2018-07-11 DIAGNOSIS — R000 Tachycardia, unspecified: Secondary | ICD-10-CM | POA: Diagnosis not present

## 2018-07-11 DIAGNOSIS — R0602 Shortness of breath: Secondary | ICD-10-CM | POA: Diagnosis not present

## 2018-07-11 NOTE — Patient Instructions (Signed)
Medication Instructions:  Your physician recommends that you continue on your current medications as directed. Please refer to the Current Medication list given to you today.   Labwork: NONE  Testing/Procedures: NONE  Follow-Up: Your physician recommends that you schedule a follow-up appointment in: [place] September WITH APP.

## 2018-07-12 ENCOUNTER — Other Ambulatory Visit: Payer: Self-pay

## 2018-07-12 ENCOUNTER — Encounter (HOSPITAL_BASED_OUTPATIENT_CLINIC_OR_DEPARTMENT_OTHER): Payer: Self-pay | Admitting: *Deleted

## 2018-07-17 ENCOUNTER — Ambulatory Visit: Payer: Self-pay | Admitting: Plastic Surgery

## 2018-07-17 DIAGNOSIS — Z9011 Acquired absence of right breast and nipple: Secondary | ICD-10-CM

## 2018-07-20 ENCOUNTER — Ambulatory Visit (HOSPITAL_BASED_OUTPATIENT_CLINIC_OR_DEPARTMENT_OTHER): Payer: BLUE CROSS/BLUE SHIELD | Admitting: Certified Registered Nurse Anesthetist

## 2018-07-20 ENCOUNTER — Encounter (HOSPITAL_BASED_OUTPATIENT_CLINIC_OR_DEPARTMENT_OTHER)
Admission: RE | Disposition: A | Discharge status: Home / Self Care | Payer: Self-pay | Source: Ambulatory Visit | Attending: Plastic Surgery

## 2018-07-20 ENCOUNTER — Ambulatory Visit (HOSPITAL_BASED_OUTPATIENT_CLINIC_OR_DEPARTMENT_OTHER)
Admission: RE | Admit: 2018-07-20 | Discharge: 2018-07-20 | Disposition: A | Discharge status: Home / Self Care | Payer: BLUE CROSS/BLUE SHIELD | Source: Ambulatory Visit | Attending: Plastic Surgery | Admitting: Plastic Surgery

## 2018-07-20 ENCOUNTER — Other Ambulatory Visit: Payer: Self-pay

## 2018-07-20 ENCOUNTER — Encounter (HOSPITAL_BASED_OUTPATIENT_CLINIC_OR_DEPARTMENT_OTHER): Payer: Self-pay

## 2018-07-20 DIAGNOSIS — R011 Cardiac murmur, unspecified: Secondary | ICD-10-CM | POA: Insufficient documentation

## 2018-07-20 DIAGNOSIS — Z9011 Acquired absence of right breast and nipple: Secondary | ICD-10-CM | POA: Insufficient documentation

## 2018-07-20 DIAGNOSIS — F17210 Nicotine dependence, cigarettes, uncomplicated: Secondary | ICD-10-CM | POA: Insufficient documentation

## 2018-07-20 DIAGNOSIS — Z853 Personal history of malignant neoplasm of breast: Secondary | ICD-10-CM | POA: Insufficient documentation

## 2018-07-20 DIAGNOSIS — Z79899 Other long term (current) drug therapy: Secondary | ICD-10-CM | POA: Insufficient documentation

## 2018-07-20 DIAGNOSIS — Z421 Encounter for breast reconstruction following mastectomy: Secondary | ICD-10-CM | POA: Insufficient documentation

## 2018-07-20 DIAGNOSIS — Z8249 Family history of ischemic heart disease and other diseases of the circulatory system: Secondary | ICD-10-CM | POA: Insufficient documentation

## 2018-07-20 DIAGNOSIS — J449 Chronic obstructive pulmonary disease, unspecified: Secondary | ICD-10-CM | POA: Diagnosis not present

## 2018-07-20 DIAGNOSIS — K219 Gastro-esophageal reflux disease without esophagitis: Secondary | ICD-10-CM | POA: Insufficient documentation

## 2018-07-20 DIAGNOSIS — F329 Major depressive disorder, single episode, unspecified: Secondary | ICD-10-CM | POA: Insufficient documentation

## 2018-07-20 HISTORY — PX: REMOVAL OF TISSUE EXPANDER AND PLACEMENT OF IMPLANT: SHX6457

## 2018-07-20 SURGERY — REMOVAL, TISSUE EXPANDER, BREAST, WITH IMPLANT INSERTION
Anesthesia: General | Site: Breast | Laterality: Right

## 2018-07-20 MED ORDER — FENTANYL CITRATE (PF) 100 MCG/2ML IJ SOLN
50.0000 ug | INTRAMUSCULAR | Status: DC | PRN
  Administered 2018-07-20 (×2): 50 ug via INTRAVENOUS

## 2018-07-20 MED ORDER — PROPOFOL 10 MG/ML IV BOLUS
INTRAVENOUS | Status: AC
  Filled 2018-07-20: qty 20

## 2018-07-20 MED ORDER — OXYCODONE HCL 5 MG/5ML PO SOLN: 5.0000 mg | Freq: Once | ORAL | Status: DC | PRN

## 2018-07-20 MED ORDER — DEXAMETHASONE SODIUM PHOSPHATE 4 MG/ML IJ SOLN
INTRAMUSCULAR | Status: DC | PRN
  Administered 2018-07-20: 5 mg via INTRAVENOUS

## 2018-07-20 MED ORDER — MIDAZOLAM HCL 2 MG/2ML IJ SOLN
1.0000 mg | INTRAMUSCULAR | Status: DC | PRN
  Administered 2018-07-20: 2 mg via INTRAVENOUS

## 2018-07-20 MED ORDER — SODIUM CHLORIDE 0.9 % IV SOLN
INTRAVENOUS | Status: DC | PRN
  Administered 2018-07-20: 500 mL

## 2018-07-20 MED ORDER — LIDOCAINE 2% (20 MG/ML) 5 ML SYRINGE
INTRAMUSCULAR | Status: AC
  Filled 2018-07-20: qty 5

## 2018-07-20 MED ORDER — CEFAZOLIN SODIUM-DEXTROSE 2-4 GM/100ML-% IV SOLN
2.0000 g | INTRAVENOUS | Status: AC
  Administered 2018-07-20: 2 g via INTRAVENOUS

## 2018-07-20 MED ORDER — ACETAMINOPHEN 325 MG PO TABS: 650.0000 mg | ORAL_TABLET | ORAL | Status: DC | PRN

## 2018-07-20 MED ORDER — FENTANYL CITRATE (PF) 100 MCG/2ML IJ SOLN
INTRAMUSCULAR | Status: AC
  Filled 2018-07-20: qty 2

## 2018-07-20 MED ORDER — CEFAZOLIN SODIUM-DEXTROSE 2-4 GM/100ML-% IV SOLN
INTRAVENOUS | Status: AC
  Filled 2018-07-20: qty 100

## 2018-07-20 MED ORDER — LACTATED RINGERS IV SOLN
INTRAVENOUS | Status: DC
  Administered 2018-07-20 (×2): via INTRAVENOUS

## 2018-07-20 MED ORDER — OXYCODONE HCL 5 MG PO TABS: 5.0000 mg | ORAL_TABLET | ORAL | Status: DC | PRN

## 2018-07-20 MED ORDER — ONDANSETRON HCL 4 MG/2ML IJ SOLN
INTRAMUSCULAR | Status: AC
  Filled 2018-07-20: qty 2

## 2018-07-20 MED ORDER — ACETAMINOPHEN 650 MG RE SUPP
650.0000 mg | RECTAL | Status: DC | PRN
Start: 2018-07-20 — End: 2018-07-20

## 2018-07-20 MED ORDER — PROPOFOL 500 MG/50ML IV EMUL
INTRAVENOUS | Status: AC
  Filled 2018-07-20: qty 50

## 2018-07-20 MED ORDER — LIDOCAINE HCL (PF) 1 % IJ SOLN
INTRAMUSCULAR | Status: AC
Start: 2018-07-20 — End: ?
  Filled 2018-07-20: qty 30

## 2018-07-20 MED ORDER — FENTANYL CITRATE (PF) 100 MCG/2ML IJ SOLN: 25.0000 ug | INTRAMUSCULAR | Status: DC | PRN

## 2018-07-20 MED ORDER — EPHEDRINE SULFATE 50 MG/ML IJ SOLN
INTRAMUSCULAR | Status: AC
  Filled 2018-07-20: qty 1

## 2018-07-20 MED ORDER — BUPIVACAINE-EPINEPHRINE 0.25% -1:200000 IJ SOLN
INTRAMUSCULAR | Status: DC | PRN
  Administered 2018-07-20: 5 mL

## 2018-07-20 MED ORDER — PROPOFOL 10 MG/ML IV BOLUS
INTRAVENOUS | Status: DC | PRN
  Administered 2018-07-20: 100 mg via INTRAVENOUS
  Administered 2018-07-20: 200 mg via INTRAVENOUS

## 2018-07-20 MED ORDER — DEXAMETHASONE SODIUM PHOSPHATE 10 MG/ML IJ SOLN
INTRAMUSCULAR | Status: AC
Start: 2018-07-20 — End: ?
  Filled 2018-07-20: qty 1

## 2018-07-20 MED ORDER — MIDAZOLAM HCL 2 MG/2ML IJ SOLN
INTRAMUSCULAR | Status: AC
  Filled 2018-07-20: qty 2

## 2018-07-20 MED ORDER — OXYCODONE HCL 5 MG PO TABS
5.0000 mg | ORAL_TABLET | Freq: Once | ORAL | Status: DC | PRN
Start: 2018-07-20 — End: 2018-07-20

## 2018-07-20 MED ORDER — BUPIVACAINE-EPINEPHRINE (PF) 0.25% -1:200000 IJ SOLN
INTRAMUSCULAR | Status: AC
  Filled 2018-07-20: qty 30

## 2018-07-20 MED ORDER — SCOPOLAMINE 1 MG/3DAYS TD PT72: 1.0000 | MEDICATED_PATCH | Freq: Once | TRANSDERMAL | Status: DC | PRN

## 2018-07-20 MED ORDER — SODIUM CHLORIDE 0.9% FLUSH: 3.0000 mL | Freq: Two times a day (BID) | INTRAVENOUS | Status: DC

## 2018-07-20 MED ORDER — SODIUM CHLORIDE 0.9% FLUSH: 3.0000 mL | INTRAVENOUS | Status: DC | PRN

## 2018-07-20 MED ORDER — LIDOCAINE HCL (CARDIAC) PF 100 MG/5ML IV SOSY
PREFILLED_SYRINGE | INTRAVENOUS | Status: DC | PRN
  Administered 2018-07-20: 50 mg via INTRAVENOUS

## 2018-07-20 MED ORDER — KETOROLAC TROMETHAMINE 30 MG/ML IJ SOLN
INTRAMUSCULAR | Status: AC
  Filled 2018-07-20: qty 1

## 2018-07-20 MED ORDER — ONDANSETRON HCL 4 MG/2ML IJ SOLN
INTRAMUSCULAR | Status: DC | PRN
  Administered 2018-07-20: 4 mg via INTRAVENOUS

## 2018-07-20 MED ORDER — SODIUM CHLORIDE 0.9 % IV SOLN: 250.0000 mL | INTRAVENOUS | Status: DC | PRN

## 2018-07-20 MED ORDER — EPINEPHRINE 30 MG/30ML IJ SOLN
INTRAMUSCULAR | Status: AC
  Filled 2018-07-20: qty 1

## 2018-07-20 MED ORDER — BUPIVACAINE-EPINEPHRINE (PF) 0.25% -1:200000 IJ SOLN
INTRAMUSCULAR | Status: AC
  Filled 2018-07-20: qty 90

## 2018-07-20 SURGICAL SUPPLY — 64 items
BAG DECANTER FOR FLEXI CONT (MISCELLANEOUS) ×2 IMPLANT
BINDER BREAST LRG (GAUZE/BANDAGES/DRESSINGS) ×2 IMPLANT
BINDER BREAST MEDIUM (GAUZE/BANDAGES/DRESSINGS) IMPLANT
BINDER BREAST XLRG (GAUZE/BANDAGES/DRESSINGS) IMPLANT
BINDER BREAST XXLRG (GAUZE/BANDAGES/DRESSINGS) IMPLANT
BIOPATCH RED 1 DISK 7.0 (GAUZE/BANDAGES/DRESSINGS) IMPLANT
BLADE SURG 15 STRL LF DISP TIS (BLADE) ×1 IMPLANT
BLADE SURG 15 STRL SS (BLADE) ×1
BNDG GAUZE ELAST 4 BULKY (GAUZE/BANDAGES/DRESSINGS) ×4 IMPLANT
CANISTER SUCT 1200ML W/VALVE (MISCELLANEOUS) ×2 IMPLANT
CHLORAPREP W/TINT 26ML (MISCELLANEOUS) ×2 IMPLANT
COVER BACK TABLE 60X90IN (DRAPES) ×2 IMPLANT
COVER MAYO STAND STRL (DRAPES) ×2 IMPLANT
DECANTER SPIKE VIAL GLASS SM (MISCELLANEOUS) IMPLANT
DERMABOND ADVANCED (GAUZE/BANDAGES/DRESSINGS) ×1
DERMABOND ADVANCED .7 DNX12 (GAUZE/BANDAGES/DRESSINGS) ×1 IMPLANT
DRAIN CHANNEL 19F RND (DRAIN) IMPLANT
DRAPE LAPAROSCOPIC ABDOMINAL (DRAPES) ×2 IMPLANT
DRSG PAD ABDOMINAL 8X10 ST (GAUZE/BANDAGES/DRESSINGS) ×4 IMPLANT
ELECT BLADE 4.0 EZ CLEAN MEGAD (MISCELLANEOUS) ×2
ELECT COATED BLADE 2.86 ST (ELECTRODE) ×2 IMPLANT
ELECT REM PT RETURN 9FT ADLT (ELECTROSURGICAL) ×2
ELECTRODE BLDE 4.0 EZ CLN MEGD (MISCELLANEOUS) ×1 IMPLANT
ELECTRODE REM PT RTRN 9FT ADLT (ELECTROSURGICAL) ×1 IMPLANT
EVACUATOR SILICONE 100CC (DRAIN) IMPLANT
GAUZE SPONGE 4X4 12PLY STRL LF (GAUZE/BANDAGES/DRESSINGS) IMPLANT
GLOVE BIO SURGEON STRL SZ 6.5 (GLOVE) ×4 IMPLANT
GOWN STRL REUS W/ TWL LRG LVL3 (GOWN DISPOSABLE) ×2 IMPLANT
GOWN STRL REUS W/TWL LRG LVL3 (GOWN DISPOSABLE) ×2
IMPL GEL HI PROFILE 375CC (Breast) ×1 IMPLANT
IMPLANT GEL HI PROFILE 375CC (Breast) ×2 IMPLANT
IV NS 1000ML (IV SOLUTION)
IV NS 1000ML BAXH (IV SOLUTION) IMPLANT
NDL SAFETY ECLIPSE 18X1.5 (NEEDLE) ×1 IMPLANT
NEEDLE HYPO 18GX1.5 SHARP (NEEDLE) ×1
NEEDLE HYPO 25X1 1.5 SAFETY (NEEDLE) ×2 IMPLANT
PACK BASIN DAY SURGERY FS (CUSTOM PROCEDURE TRAY) ×2 IMPLANT
PENCIL BUTTON HOLSTER BLD 10FT (ELECTRODE) ×2 IMPLANT
PIN SAFETY STERILE (MISCELLANEOUS) IMPLANT
SIZER BREAST REUSE 375CC (SIZER) ×1
SIZER BREAST REUSE STRL 350CC (SIZER) ×2
SIZER BRST REUSE P4.8 12 375CC (SIZER) ×1 IMPLANT
SIZER BRST REUSE STRL 350CC (SIZER) ×1 IMPLANT
SLEEVE SCD COMPRESS KNEE MED (MISCELLANEOUS) ×2 IMPLANT
SPONGE LAP 18X18 RF (DISPOSABLE) ×4 IMPLANT
STRIP SUTURE WOUND CLOSURE 1/2 (SUTURE) IMPLANT
SUT MNCRL AB 3-0 PS2 18 (SUTURE) IMPLANT
SUT MNCRL AB 4-0 PS2 18 (SUTURE) ×4 IMPLANT
SUT MON AB 3-0 SH 27 (SUTURE) ×3
SUT MON AB 3-0 SH27 (SUTURE) ×3 IMPLANT
SUT MON AB 5-0 PS2 18 (SUTURE) IMPLANT
SUT PDS 3-0 CT2 (SUTURE)
SUT PDS AB 2-0 CT2 27 (SUTURE) IMPLANT
SUT PDS II 3-0 CT2 27 ABS (SUTURE) IMPLANT
SUT SILK 3 0 PS 1 (SUTURE) IMPLANT
SUT VIC AB 3-0 SH 27 (SUTURE) ×1
SUT VIC AB 3-0 SH 27X BRD (SUTURE) ×1 IMPLANT
SUT VICRYL 4-0 PS2 18IN ABS (SUTURE) ×2 IMPLANT
SYR BULB IRRIGATION 50ML (SYRINGE) ×2 IMPLANT
SYR CONTROL 10ML LL (SYRINGE) ×2 IMPLANT
TOWEL GREEN STERILE FF (TOWEL DISPOSABLE) ×4 IMPLANT
TUBE CONNECTING 20X1/4 (TUBING) ×2 IMPLANT
UNDERPAD 30X30 (UNDERPADS AND DIAPERS) ×4 IMPLANT
YANKAUER SUCT BULB TIP NO VENT (SUCTIONS) ×2 IMPLANT

## 2018-07-20 NOTE — H&P (Signed)
Theresa Chaney is an 37 y.o. female.   Chief Complaint: acquired absence of right breast HPI: The patient is a 37 yrs old wf here for right breast reconstruction after expander and FlexHD placement 03/19/18.  Originally she noticed a right breast mass which lead to mammogram, ultrasound and biopsy.  The stereotactic biopsy from 10/30 showed a carcinoma in situ.  There was invasive carcinoma as well with ER positive and PR negative, HER-2 negative results.  The chemo did not change the size of the lesions.  She is going to have exchange surgery prior to the start of radiation. She completed Taxol 06/11/2018, Taxol. We discussed the possible need for a latissimus flap to the right breast following radiation therapy with the patient at her last visit.   Past Medical History:  Diagnosis Date  . Anemia   . BRCA negative 11/26/2017  . Breast cancer ([place]) 10/11/2017   Multifocal, ER positive, PR negative, HER-2 negative.  . Chronic bronchitis ([place]) 11/2017  . COPD (chronic obstructive pulmonary disease) ([place])    MILD PER CXR  . Depression   . Family history of cancer   . GERD (gastroesophageal reflux disease)   . Headache    MIGRAINES  . Heart murmur    ASYMPTOMATIC  . Personal history of chemotherapy    current for right breast ca    Past Surgical History:  Procedure Laterality Date  . AXILLARY LYMPH NODE DISSECTION Right 03/19/2018   Procedure: AXILLARY LYMPH NODE DISSECTION;  Surgeon: Robert Bellow, MD;  Location: [facility];  Service: General;  Laterality: Right;  . BREAST BIOPSY Right 10/11/2017   12:30 posterior coil clip invasive mammary carcinoma  . BREAST BIOPSY Right 10/11/2017   11:30 middle depth ribbon clip DCIS  . BREAST BIOPSY Right 10/11/2017   5:30 anterior depth x shape invasive ductal carcinoma  . BREAST RECONSTRUCTION WITH PLACEMENT OF TISSUE EXPANDER AND FLEX HD (ACELLULAR HYDRATED DERMIS) Right 03/19/2018   Procedure: BREAST RECONSTRUCTION WITH PLACEMENT OF TISSUE EXPANDER  AND FLEX HD (ACELLULAR HYDRATED DERMIS);  Surgeon: Wallace Going, DO;  Location: [facility];  Service: Plastics;  Laterality: Right;  . LAPAROSCOPIC BILATERAL SALPINGO OOPHERECTOMY Bilateral 03/19/2018   Procedure: LAPAROSCOPIC BILATERAL SALPINGO OOPHORECTOMY;  Surgeon: Benjaman Kindler, MD;  Location: [facility];  Service: Gynecology;  Laterality: Bilateral;  . MASTECTOMY W/ SENTINEL NODE BIOPSY Right 03/19/2018   Procedure: MASTECTOMY WITH SENTINEL LYMPH NODE BIOPSY;  Surgeon: Robert Bellow, MD;  Location: [facility];  Service: General;  Laterality: Right;  . PORTACATH PLACEMENT Left 10/24/2017   Procedure: INSERTION PORT-A-CATH;  Surgeon: Robert Bellow, MD;  Location: [facility];  Service: General;  Laterality: Left;    Family History  Problem Relation Age of Onset  . Melanoma Maternal Aunt        other aunts with BCC/SCC/Melanoma  . Diabetes Father   . Hypertension Father   . Hyperlipidemia Father   . Heart attack Father 80       "mild"  . Bladder Cancer Maternal Grandmother   . Cervical cancer Maternal Aunt 64       daughter w/ cervical cancer as well  . Melanoma Maternal Uncle        other uncles with BCC/SCC/Melanoma   Social History:  reports that she has been smoking cigarettes. She has a 9.00 pack-year smoking history. She has never used smokeless tobacco. She reports that she does not drink alcohol or use drugs.  Allergies: No Known Allergies  Medications Prior to Admission  Medication Sig Dispense Refill  . ALPRAZolam (XANAX) 0.25 MG tablet Take 0.25 mg at bedtime as needed by mouth for anxiety or sleep.     . Calcium Carb-Cholecalciferol (CALCIUM 600 + D PO) Take 1 tablet daily by mouth.    . escitalopram (LEXAPRO) 20 MG tablet Take 20 mg by mouth every morning.     [name] esomeprazole (NEXIUM) 40 MG capsule Take 40 mg by mouth every morning.     . gabapentin (NEURONTIN) 300 MG capsule Take 1 capsule (300 mg total) by mouth 3 (three) times daily. 270 capsule 0  .  ibuprofen (ADVIL,MOTRIN) 800 MG tablet Take 1 tablet (800 mg total) by mouth every 8 (eight) hours as needed for moderate pain. 30 tablet 1  . letrozole (FEMARA) 2.5 MG tablet Take 1 tablet (2.5 mg total) by mouth daily. 90 tablet 0  . loratadine (CLARITIN) 10 MG tablet Take 10 mg by mouth daily.     [name] LORazepam (ATIVAN) 1 MG tablet Take 1 mg by mouth every 8 (eight) hours.    . pyridOXINE (VITAMIN B-6) 100 MG tablet Take 1 tablet (100 mg total) by mouth daily. 30 tablet 1  . vitamin C (ASCORBIC ACID) 500 MG tablet Take 500 mg by mouth daily.    [name] albuterol (PROVENTIL HFA;VENTOLIN HFA) 108 (90 Base) MCG/ACT inhaler Inhale 2 puffs into the lungs every 6 (six) hours as needed for wheezing or shortness of breath (Cough). 1 Inhaler 2  . chlorhexidine (PERIDEX) 0.12 % solution USE AS DIRECTED 15MLS IN THE MOUTH OR THROAT TWICE A DAY 473 mL 1  . doxylamine, Sleep, (UNISOM) 25 MG tablet Take 25 mg by mouth at bedtime as needed for sleep.    [name] lidocaine (XYLOCAINE) 2 % solution Use as directed 10 mLs in the mouth or throat every 4 (four) hours as needed for mouth pain. 200 mL 0  . lidocaine-prilocaine (EMLA) cream Apply to affected area once 30 g 3  . prochlorperazine (COMPAZINE) 10 MG tablet Take 1 tablet (10 mg total) by mouth every 6 (six) hours as needed (Nausea or vomiting). 30 tablet 1  . promethazine (PHENERGAN) 25 MG tablet Take 1 tablet (25 mg total) by mouth every 6 (six) hours as needed for nausea or vomiting. 30 tablet 0    No results found for this or any previous visit (from the past 48 hour(s)). No results found.  Review of Systems  Constitutional: Negative.   HENT: Negative.   Eyes: Negative.   Respiratory: Negative.   Cardiovascular: Negative.   Gastrointestinal: Negative.   Genitourinary: Negative.   Musculoskeletal: Negative.   Skin: Negative.   Neurological: Negative.   Psychiatric/Behavioral: Negative.     Blood pressure 103/65, pulse 75, temperature 98.2 F (36.8 C),  temperature source Oral, resp. rate 18, height 5' 7"  (1.702 m), weight 77.3 kg, SpO2 100 %. Physical Exam  Constitutional: She is oriented to person, place, and time. She appears well-developed and well-nourished.  HENT:  Head: Normocephalic and atraumatic.  Eyes: Pupils are equal, round, and reactive to light. Conjunctivae are normal.  Cardiovascular: Normal rate.  Respiratory: Effort normal.  GI: Soft.  Neurological: She is alert and oriented to person, place, and time.  Skin: Skin is warm.  Psychiatric: She has a normal mood and affect. Her behavior is normal. Judgment and thought content normal.     Assessment/Plan Plan for removal of right breast expander and placement of a silicone implant.  She has 330/450 cc in place. Loel Lofty  Dillingham, DO 07/20/2018, 7:02 AM

## 2018-07-20 NOTE — Discharge Instructions (Addendum)
May shower tomorrow, No driving while on pain meds. Continue with the binder or sports bra. No heavy lifting.    Call your surgeon if you experience:   1.  Fever over 101.0. 2.  Inability to urinate. 3.  Nausea and/or vomiting. 4.  Extreme swelling or bruising at the surgical site. 5.  Continued bleeding from the incision. 6.  Increased pain, redness or drainage from the incision. 7.  Problems related to your pain medication. 8.  Any problems and/or concerns    Post Anesthesia Home Care Instructions  Activity: Get plenty of rest for the remainder of the day. A responsible individual must stay with you for 24 hours following the procedure.  For the next 24 hours, DO NOT: -Drive a car -Paediatric nurse -Drink alcoholic beverages -Take any medication unless instructed by your physician -Make any legal decisions or sign important papers.  Meals: Start with liquid foods such as gelatin or soup. Progress to regular foods as tolerated. Avoid greasy, spicy, heavy foods. If nausea and/or vomiting occur, drink only clear liquids until the nausea and/or vomiting subsides. Call your physician if vomiting continues.  Special Instructions/Symptoms: Your throat may feel dry or sore from the anesthesia or the breathing tube placed in your throat during surgery. If this causes discomfort, gargle with warm salt water. The discomfort should disappear within 24 hours.  If you had a scopolamine patch placed behind your ear for the management of post- operative nausea and/or vomiting:  1. The medication in the patch is effective for 72 hours, after which it should be removed.  Wrap patch in a tissue and discard in the trash. Wash hands thoroughly with soap and water. 2. You may remove the patch earlier than 72 hours if you experience unpleasant side effects which may include dry mouth, dizziness or visual disturbances. 3. Avoid touching the patch. Wash your hands with soap and water after contact  with the patch.

## 2018-07-20 NOTE — Op Note (Signed)
Op report Unilateral Breast Exchange   DATE OF OPERATION:  07/20/2018  LOCATION: [facility]  SURGICAL DIVISION: Plastic Surgery  PREOPERATIVE DIAGNOSES:  1. History of right breast cancer.  2. Acquired absence of right breast.   POSTOPERATIVE DIAGNOSES:  1. History of right breast cancer.  2. Acquired absence of right breast.   PROCEDURE:  1. Exchange of tissue expander for implant. 2. Capsulotomies for implant respositioning.  SURGEON: Annia Gomm Sanger Jermine Bibbee, DO  ANESTHESIA:  General.   COMPLICATIONS: None.   IMPLANTS: [place] Smooth Round High Profile Gel 375cc. Ref #[id number].  Serial Number [id number]  INDICATIONS FOR PROCEDURE:  The patient, Theresa Chaney, is a 37 y.o. female born on 1981/03/09, is here for treatment for further treatment after a mastectomy and placement of a tissue expander. She now presents for exchange of her expander for an implant.  She requires capsulotomies to better position the implant. MRN: [id number]  CONSENT:  Informed consent was obtained directly from the patient. Risks, benefits and alternatives were fully discussed. Specific risks including but not limited to bleeding, infection, hematoma, seroma, scarring, pain, implant infection, implant extrusion, capsular contracture, asymmetry, wound healing problems, and need for further surgery were all discussed. The patient did have an ample opportunity to have her questions answered to her satisfaction.   DESCRIPTION OF PROCEDURE:  The patient was taken to the operating room. SCDs were placed and IV antibiotics were given. The patient's chest was prepped and draped in a sterile fashion. A time out was performed and the implants to be used were identified.  One percent Xylocaine with epinephrine was used to infiltrate the area.   The old mastectomy scar was excised and superior mastectomy and inferior mastectomy flaps were re-raised over the pectoralis major muscle. The  pectoralis was split to expose the tissue expander which was removed. Inspection of the pocket showed a normal healthy capsule and good integration of the biologic matrix.   Circumferential capsulotomies were performed to allow for breast pocket expansion.  Measurements were made to confirm adequate pocket size for the implant dimensions.  Hemostasis was ensured with electrocautery.  The pocket was irrigated with antibiotic solution.  New gloves were placed.  The implant was placed in the pocket and oriented appropriately. The pectoralis major muscle and capsule on the anterior surface were re-closed with a 3-0 running Monocryl suture. The remaining skin was closed with 4-0 Monocryl deep dermal and 5-0 Monocryl subcuticular stitches.  Dermabond was applied.  A breast binder and ABD was applied.  The patient was allowed to wake from anesthesia and taken to the recovery room in satisfactory condition.

## 2018-07-20 NOTE — Anesthesia Procedure Notes (Signed)
Procedure Name: LMA Insertion Date/Time: 07/20/2018 8:45 AM Performed by: Bufford Spikes, CRNA Pre-anesthesia Checklist: Patient identified, Emergency Drugs available, Suction available and Patient being monitored Patient Re-evaluated:Patient Re-evaluated prior to induction Oxygen Delivery Method: Circle system utilized Preoxygenation: Pre-oxygenation with 100% oxygen Induction Type: IV induction Ventilation: Mask ventilation without difficulty LMA: LMA inserted LMA Size: 4.0 Number of attempts: 1 Airway Equipment and Method: Bite block Placement Confirmation: positive ETCO2 Tube secured with: Tape Dental Injury: Teeth and Oropharynx as per pre-operative assessment

## 2018-07-20 NOTE — Anesthesia Postprocedure Evaluation (Signed)
Anesthesia Post Note  Patient: Theresa Chaney  Procedure(s) Performed: REMOVAL OF RIGHT BREAST TISSUE EXPANDER AND PLACEMENT OF IMPLANT (Right Breast)     Patient location during evaluation: PACU Anesthesia Type: General Level of consciousness: awake and alert Pain management: pain level controlled Vital Signs Assessment: post-procedure vital signs reviewed and stable Respiratory status: spontaneous breathing, nonlabored ventilation, respiratory function stable and patient connected to nasal cannula oxygen Cardiovascular status: blood pressure returned to baseline and stable Postop Assessment: no apparent nausea or vomiting Anesthetic complications: no    Last Vitals:  Vitals:   07/20/18 1000 07/20/18 1032  BP: 130/81 131/84  Pulse: 93 93  Resp: 14 16  Temp:  36.8 C  SpO2: 99% 95%    Last Pain:  Vitals:   07/20/18 1032  TempSrc:   PainSc: 1                  Brogan England

## 2018-07-20 NOTE — Transfer of Care (Signed)
Immediate Anesthesia Transfer of Care Note  Patient: Theresa Chaney  Procedure(s) Performed: REMOVAL OF RIGHT BREAST TISSUE EXPANDER AND PLACEMENT OF IMPLANT (Right Breast)  Patient Location: PACU  Anesthesia Type:General  Level of Consciousness: awake, alert  and oriented  Airway & Oxygen Therapy: Patient Spontanous Breathing and Patient connected to nasal cannula oxygen  Post-op Assessment: Report given to RN and Post -op Vital signs reviewed and stable  Post vital signs: Reviewed and stable  Last Vitals:  Vitals Value Taken Time  BP    Temp    Pulse 106 07/20/2018  9:55 AM  Resp 19 07/20/2018  9:55 AM  SpO2 94 % 07/20/2018  9:55 AM  Vitals shown include unvalidated device data.  Last Pain:  Vitals:   07/20/18 0642  TempSrc: Oral  PainSc: 0-No pain         Complications: No apparent anesthesia complications

## 2018-07-20 NOTE — Anesthesia Preprocedure Evaluation (Signed)
Anesthesia Evaluation  Patient identified by MRN, date of birth, ID band Patient awake    Reviewed: Allergy & Precautions, NPO status , Patient's Chart, lab work & pertinent test results  History of Anesthesia Complications Negative for: history of anesthetic complications  Airway Mallampati: I  TM Distance: >3 FB Neck ROM: Full    Dental  (+) Teeth Intact   Pulmonary neg shortness of breath, COPD: mild., neg recent URI, Current Smoker,    breath sounds clear to auscultation- rhonchi (-) wheezing      Cardiovascular Exercise Tolerance: Good (-) hypertension(-) CAD, (-) Past MI, (-) Cardiac Stents and (-) CABG  Rhythm:Regular Rate:Normal - Systolic murmurs and - Diastolic murmurs    Neuro/Psych  Headaches, PSYCHIATRIC DISORDERS Depression    GI/Hepatic Neg liver ROS, GERD  Controlled and Medicated,  Endo/Other  negative endocrine ROSneg diabetes  Renal/GU negative Renal ROS     Musculoskeletal negative musculoskeletal ROS (+)   Abdominal (+) - obese,   Peds  Hematology  (+) anemia ,   Anesthesia Other Findings   Reproductive/Obstetrics                             Anesthesia Physical Anesthesia Plan  ASA: II  Anesthesia Plan: General   Post-op Pain Management:    Induction: Intravenous  PONV Risk Score and Plan: 2 and Ondansetron and Dexamethasone  Airway Management Planned: LMA  Additional Equipment: None  Intra-op Plan:   Post-operative Plan: Extubation in OR  Informed Consent: I have reviewed the patients History and Physical, chart, labs and discussed the procedure including the risks, benefits and alternatives for the proposed anesthesia with the patient or authorized representative who has indicated his/her understanding and acceptance.   Dental advisory given  Plan Discussed with: CRNA and Surgeon  Anesthesia Plan Comments:         Anesthesia Quick  Evaluation

## 2018-07-23 ENCOUNTER — Encounter (HOSPITAL_BASED_OUTPATIENT_CLINIC_OR_DEPARTMENT_OTHER): Payer: Self-pay | Admitting: Plastic Surgery

## 2018-07-25 ENCOUNTER — Encounter: Payer: Self-pay | Admitting: Oncology

## 2018-07-30 ENCOUNTER — Inpatient Hospital Stay: Payer: BLUE CROSS/BLUE SHIELD

## 2018-07-30 ENCOUNTER — Inpatient Hospital Stay: Payer: BLUE CROSS/BLUE SHIELD | Attending: Oncology | Admitting: Oncology

## 2018-07-30 ENCOUNTER — Encounter: Payer: Self-pay | Admitting: Oncology

## 2018-07-30 ENCOUNTER — Other Ambulatory Visit: Payer: Self-pay

## 2018-07-30 VITALS — BP 105/68 | HR 91 | Temp 96.4°F | Resp 18 | Wt 174.4 lb

## 2018-07-30 DIAGNOSIS — Z7983 Long term (current) use of bisphosphonates: Secondary | ICD-10-CM | POA: Diagnosis not present

## 2018-07-30 DIAGNOSIS — C50411 Malignant neoplasm of upper-outer quadrant of right female breast: Secondary | ICD-10-CM | POA: Diagnosis present

## 2018-07-30 DIAGNOSIS — C50811 Malignant neoplasm of overlapping sites of right female breast: Secondary | ICD-10-CM

## 2018-07-30 DIAGNOSIS — Z780 Asymptomatic menopausal state: Secondary | ICD-10-CM | POA: Diagnosis not present

## 2018-07-30 DIAGNOSIS — Z79811 Long term (current) use of aromatase inhibitors: Secondary | ICD-10-CM | POA: Diagnosis not present

## 2018-07-30 DIAGNOSIS — Z905 Acquired absence of kidney: Secondary | ICD-10-CM | POA: Diagnosis not present

## 2018-07-30 DIAGNOSIS — Z95828 Presence of other vascular implants and grafts: Secondary | ICD-10-CM

## 2018-07-30 DIAGNOSIS — Z170 Estrogen receptor positive status [ER+]: Secondary | ICD-10-CM | POA: Diagnosis not present

## 2018-07-30 DIAGNOSIS — Z90722 Acquired absence of ovaries, bilateral: Secondary | ICD-10-CM | POA: Insufficient documentation

## 2018-07-30 LAB — COMPREHENSIVE METABOLIC PANEL
ALBUMIN: 4.1 g/dL (ref 3.5–5.0)
ALK PHOS: 69 U/L (ref 38–126)
ALT: 18 U/L (ref 0–44)
AST: 25 U/L (ref 15–41)
Anion gap: 11 (ref 5–15)
BILIRUBIN TOTAL: 0.5 mg/dL (ref 0.3–1.2)
BUN: 19 mg/dL (ref 6–20)
CALCIUM: 9.2 mg/dL (ref 8.9–10.3)
CO2: 23 mmol/L (ref 22–32)
CREATININE: 0.62 mg/dL (ref 0.44–1.00)
Chloride: 104 mmol/L (ref 98–111)
GFR calc Af Amer: >60 mL/min (ref >60)
GLUCOSE: 130 mg/dL — AB (ref 70–99)
POTASSIUM: 3.8 mmol/L (ref 3.5–5.1)
Sodium: 138 mmol/L (ref 135–145)
TOTAL PROTEIN: 7.1 g/dL (ref 6.5–8.1)

## 2018-07-30 LAB — CBC WITH DIFFERENTIAL/PLATELET
BASOS ABS: 0 10*3/uL (ref 0–0.1)
Basophils Relative: 0 %
EOS ABS: 0.1 10*3/uL (ref 0–0.7)
Eosinophils Relative: 1 %
HEMATOCRIT: 35.4 % (ref 35.0–47.0)
HEMOGLOBIN: 12.2 g/dL (ref 12.0–16.0)
LYMPHS PCT: 19 %
Lymphs Abs: 1.6 10*3/uL (ref 1.0–3.6)
MCH: 31.4 pg (ref 26.0–34.0)
MCHC: 34.4 g/dL (ref 32.0–36.0)
MCV: 91.4 fL (ref 80.0–100.0)
MONO ABS: 0.7 10*3/uL (ref 0.2–0.9)
Monocytes Relative: 9 %
Neutro Abs: 6 10*3/uL (ref 1.4–6.5)
Neutrophils Relative %: 71 %
Platelets: 269 10*3/uL (ref 150–440)
RBC: 3.87 MIL/uL (ref 3.80–5.20)
RDW: 13.5 % (ref 11.5–14.5)
WBC: 8.5 10*3/uL (ref 3.6–11.0)

## 2018-07-30 MED ORDER — SODIUM CHLORIDE 0.9% FLUSH
10.0000 mL | Freq: Once | INTRAVENOUS | Status: AC
  Administered 2018-07-30: 10 mL via INTRAVENOUS
  Filled 2018-07-30: qty 10

## 2018-07-30 MED ORDER — HEPARIN SOD (PORK) LOCK FLUSH 100 UNIT/ML IV SOLN
500.0000 [IU] | Freq: Once | INTRAVENOUS | Status: AC
  Administered 2018-07-30: 500 [IU] via INTRAVENOUS

## 2018-07-30 NOTE — Progress Notes (Signed)
Hematology/Oncology Follow Up Note [facility] [phone] [phone]  Patient Care Team: Juluis Pitch, MD as PCP - General (Family Medicine)   Name of the patient: Theresa Chaney  [id number]  [birth year]   Date of visit: 07/30/18 REASON FOR VISIT Follow up for Assessment prior to chemotherapy treatment of breast cancer  Oncology History 10/11/2017 Diagnosed with [id number]  Neoadjuvant dose dense AC and 1 cycle of Taxol.  Interim image showed no treatment response. 03/19/2018 right mastectomy and right axillary dissection, bilateral salpingo-oophorectomy. ypT3 ypN2 #Negative genetic testing #06/11/2018 Finish adjuvant weekly Taxol x11 Grade 2 neuropathy  #right immediate breast reconstruction with placement of tissue expanders.  INTERVAL HISTORY Patient presents for follow-up of breast cancer management. Patient has completed right breast reconstruction on 07/20/2018 and is going to start adjuvant radiation in a few weeks. Per patient, she was told that she needs a 6 weeks break before starting her radiation. Otherwise doing well.  Good energy. Quit smoking during the interval. Some weight gain Also had gained 3 pounds in the past 3 weeks. # Neuropathy, stable.   Cancer TREATMENT Neoadjuvant ddAC +1 dose of Taxol, due to lack of response, surgery was offered. Case was discussed on breast tumor board. 03/19/2018 S/p right mastectomy and right axillary dissection, immediate breast reconstruction with placement of expanders. Also had elective bilateral salpingo-oophorectomy..  # Currently on adjuvant Taxol weekly, need to finish total of 11 cycles adjuvantly.  # .# She has obtained dental clearance for starting Zometa.  S/p Zometa on 6/3/ 2019   Review of Systems  Constitutional: Positive for malaise/fatigue. Negative for chills, diaphoresis, fever and weight loss.  HENT: Negative for congestion, ear discharge, ear pain, hearing loss,  nosebleeds, sinus pain, sore throat and tinnitus.   Eyes: Negative for blurred vision, double vision, photophobia, pain, discharge and redness.  Respiratory: Negative for cough, hemoptysis, sputum production, shortness of breath and wheezing.   Cardiovascular: Negative for chest pain, palpitations, orthopnea, claudication and leg swelling.  Gastrointestinal: Negative for abdominal pain, blood in stool, constipation, diarrhea, heartburn, melena, nausea and vomiting.  Genitourinary: Negative for dysuria, flank pain, frequency, hematuria and urgency.  Musculoskeletal: Negative for back pain, myalgias and neck pain.  Skin: Negative for itching and rash.  Neurological: Positive for tingling. Negative for dizziness, tremors, speech change, focal weakness, weakness and headaches.  Endo/Heme/Allergies: Negative for environmental allergies and polydipsia. Does not bruise/bleed easily.  Psychiatric/Behavioral: Negative for depression, hallucinations and substance abuse. The patient is not nervous/anxious.     No Known Allergies  Patient Active Problem List   Diagnosis Date Noted  . Estrogen receptor positive status (ER+) 04/04/2018  . Breast cancer of upper-outer quadrant of right female breast ([facility] 03/19/2018  . Family history of cancer   . Malignant neoplasm of overlapping sites of right breast in female, estrogen receptor positive ([place] 10/19/2017     Past Medical History:  Diagnosis Date  . Anemia   . BRCA negative 11/26/2017  . Breast cancer ([place] 10/11/2017   Multifocal, ER positive, PR negative, HER-2 negative.  . Chronic bronchitis ([facility] 11/2017  . COPD (chronic obstructive pulmonary disease) ([facility]    MILD PER CXR  . Depression   . Family history of cancer   . GERD (gastroesophageal reflux disease)   . Headache    MIGRAINES  . Heart murmur    ASYMPTOMATIC  . Personal history of chemotherapy    current for right breast ca     Past Surgical History:  Procedure Laterality  Date  . AXILLARY LYMPH NODE DISSECTION Right 03/19/2018   Procedure: AXILLARY LYMPH NODE DISSECTION;  Surgeon: Robert Bellow, MD;  Location: [facility] ORS;  Service: General;  Laterality: Right;  . BREAST BIOPSY Right 10/11/2017   12:30 posterior coil clip invasive mammary carcinoma  . BREAST BIOPSY Right 10/11/2017   11:30 middle depth ribbon clip DCIS  . BREAST BIOPSY Right 10/11/2017   5:30 anterior depth x shape invasive ductal carcinoma  . BREAST RECONSTRUCTION WITH PLACEMENT OF TISSUE EXPANDER AND FLEX HD (ACELLULAR HYDRATED DERMIS) Right 03/19/2018   Procedure: BREAST RECONSTRUCTION WITH PLACEMENT OF TISSUE EXPANDER AND FLEX HD (ACELLULAR HYDRATED DERMIS);  Surgeon: Wallace Going, DO;  Location: [facility] ORS;  Service: Plastics;  Laterality: Right;  . LAPAROSCOPIC BILATERAL SALPINGO OOPHERECTOMY Bilateral 03/19/2018   Procedure: LAPAROSCOPIC BILATERAL SALPINGO OOPHORECTOMY;  Surgeon: Benjaman Kindler, MD;  Location: [facility] ORS;  Service: Gynecology;  Laterality: Bilateral;  . MASTECTOMY W/ SENTINEL NODE BIOPSY Right 03/19/2018   Procedure: MASTECTOMY WITH SENTINEL LYMPH NODE BIOPSY;  Surgeon: Robert Bellow, MD;  Location: [facility] OR[facility];  Service: General;  Laterality: Right;  . PORTACATH PLACEMENT Left 10/24/2017   Procedure: INSERTION PORT-A-CATH;  Surgeon: Robert Bellow, MD;  Location: [facility] ORS;  Service: General;  Laterality: Left;  . REMOVAL OF TISSUE EXPANDER AND PLACEMENT OF IMPLANT Right 07/20/2018   Procedure: REMOVAL OF RIGHT BREAST TISSUE EXPANDER AND PLACEMENT OF IMPLANT;  Surgeon: Wallace Going, DO;  Location: [place];  Service: Plastics;  Laterality: Right;    Social History   Socioeconomic History  . Marital status: Married    Spouse name: Not on file  . Number of children: Not on file  . Years of education: Not on file  . Highest education level: Not on file  Occupational History  . Occupation: Occupational psychologist    Comment: [employer]  . Financial resource strain: Not on file  . Food insecurity:    Worry: Not on file    Inability: Not on file  . Transportation needs:    Medical: Not on file    Non-medical: Not on file  Tobacco Use  . Smoking status: Current Every Day Smoker    Packs/day: 0.50    Years: 18.00    Pack years: 9.00    Types: Cigarettes    Last attempt to quit: 12/12/2017    Years since quitting: 0.6  . Smokeless tobacco: Never Used  Substance and Sexual Activity  . Alcohol use: No    Frequency: Never  . Drug use: No  . Sexual activity: Yes    Birth control/protection: Injection  Lifestyle  . Physical activity:    Days per week: Not on file    Minutes per session: Not on file  . Stress: Not on file  Relationships  . Social connections:    Talks on phone: Not on file    Gets together: Not on file    Attends religious service: Not on file    Active member of club or organization: Not on file    Attends meetings of clubs or organizations: Not on file    Relationship status: Not on file  . Intimate partner violence:    Fear of current or ex partner: Not on file    Emotionally abused: Not on file    Physically abused: Not on file    Forced sexual activity: Not on file  Other Topics Concern  .  Not on file  Social History Narrative  . Not on file     Family History  Problem Relation Age of Onset  . Melanoma Maternal Aunt        other aunts with BCC/SCC/Melanoma  . Diabetes Father   . Hypertension Father   . Hyperlipidemia Father   . Heart attack Father 7       "mild"  . Bladder Cancer Maternal Grandmother   . Cervical cancer Maternal Aunt 64       daughter w/ cervical cancer as well  . Melanoma Maternal Uncle        other uncles with BCC/SCC/Melanoma     Current Outpatient Medications:  .  albuterol (PROVENTIL HFA;VENTOLIN HFA) 108 (90 Base) MCG/ACT inhaler, Inhale 2 puffs into the lungs every 6 (six) hours as needed for wheezing or shortness of  breath (Cough)., Disp: 1 Inhaler, Rfl: 2 .  Calcium Carb-Cholecalciferol (CALCIUM 600 + D PO), Take 1 tablet daily by mouth., Disp: , Rfl:  .  cephALEXin (KEFLEX) 500 MG capsule, , Disp: , Rfl:  .  chlorhexidine (PERIDEX) 0.12 % solution, USE AS DIRECTED 15MLS IN THE MOUTH OR THROAT TWICE A DAY, Disp: 473 mL, Rfl: 1 .  doxylamine, Sleep, (UNISOM) 25 MG tablet, Take 25 mg by mouth at bedtime as needed for sleep., Disp: , Rfl:  .  escitalopram (LEXAPRO) 20 MG tablet, Take 20 mg by mouth every morning. , Disp: , Rfl:  .  esomeprazole (NEXIUM) 40 MG capsule, Take 40 mg by mouth every morning. , Disp: , Rfl:  .  gabapentin (NEURONTIN) 300 MG capsule, Take 1 capsule (300 mg total) by mouth 3 (three) times daily., Disp: 270 capsule, Rfl: 0 .  ibuprofen (ADVIL,MOTRIN) 800 MG tablet, Take 1 tablet (800 mg total) by mouth every 8 (eight) hours as needed for moderate pain., Disp: 30 tablet, Rfl: 1 .  loratadine (CLARITIN) 10 MG tablet, Take 10 mg by mouth daily. , Disp: , Rfl:  .  LORazepam (ATIVAN) 1 MG tablet, Take 1 mg by mouth every 8 (eight) hours., Disp: , Rfl:  .  pyridOXINE (VITAMIN B-6) 100 MG tablet, Take 1 tablet (100 mg total) by mouth daily., Disp: 30 tablet, Rfl: 1 .  vitamin C (ASCORBIC ACID) 500 MG tablet, Take 500 mg by mouth daily., Disp: , Rfl:  .  ALPRAZolam (XANAX) 0.25 MG tablet, Take 0.25 mg at bedtime as needed by mouth for anxiety or sleep. , Disp: , Rfl:  .  letrozole (FEMARA) 2.5 MG tablet, Take 1 tablet (2.5 mg total) by mouth daily. (Patient not taking: Reported on 07/30/2018), Disp: 90 tablet, Rfl: 0 .  prochlorperazine (COMPAZINE) 10 MG tablet, Take 1 tablet (10 mg total) by mouth every 6 (six) hours as needed (Nausea or vomiting). (Patient not taking: Reported on 07/30/2018), Disp: 30 tablet, Rfl: 1 .  promethazine (PHENERGAN) 25 MG tablet, Take 1 tablet (25 mg total) by mouth every 6 (six) hours as needed for nausea or vomiting. (Patient not taking: Reported on 07/30/2018), Disp:  30 tablet, Rfl: 0   Physical exam:  Vitals:   07/30/18 1029  BP: 105/68  Pulse: 91  Resp: 18  Temp: (!) 96.4 F (35.8 C)  Weight: 174 lb 6.4 oz (79.1 kg)  ECOG 0 Physical Exam  Constitutional: She is oriented to person, place, and time and well-developed, well-nourished, and in no distress. No distress.  HENT:  Head: Normocephalic and atraumatic.  Nose: Nose normal.  Mouth/Throat: Oropharynx is clear  and moist. No oropharyngeal exudate.  No thrush,  Eyes: Pupils are equal, round, and reactive to light. Conjunctivae and EOM are normal. Left eye exhibits no discharge. No scleral icterus.  Neck: Normal range of motion. Neck supple. No JVD present.  Cardiovascular: Normal rate, regular rhythm and normal heart sounds.  No murmur heard. Pulmonary/Chest: Effort normal and breath sounds normal. No respiratory distress. She has no wheezes. She has no rales. She exhibits no tenderness.  Abdominal: Soft. Bowel sounds are normal. She exhibits no distension and no mass. There is no tenderness. There is no rebound.  Musculoskeletal: Normal range of motion. She exhibits no edema or tenderness.  Lymphadenopathy:    She has no cervical adenopathy.  Neurological: She is alert and oriented to person, place, and time. No cranial nerve deficit. She exhibits normal muscle tone.  Skin: Skin is warm and dry. No rash noted. She is not diaphoretic. No erythema.  Psychiatric: Mood, affect and judgment normal.  Breast exam was performed in seated and lying down position. Patient is status post right breast lumpectomy and reconstruction with a well-healed surgical scar. No evidence of any palpable masses. No evidence of axillary adenopathy. No evidence of any palpable masses or lumps in the left breast. No evidence of left axillary adenopathy     CMP Latest Ref Rng & Units 07/30/2018  Glucose 70 - 99 mg/dL 130(H)  BUN 6 - 20 mg/dL 19  Creatinine 0.44 - 1.00 mg/dL 0.62  Sodium 135 - 145 mmol/L 138    Potassium 3.5 - 5.1 mmol/L 3.8  Chloride 98 - 111 mmol/L 104  CO2 22 - 32 mmol/L 23  Calcium 8.9 - 10.3 mg/dL 9.2  Total Protein 6.5 - 8.1 g/dL 7.1  Total Bilirubin 0.3 - 1.2 mg/dL 0.5  Alkaline Phos 38 - 126 U/L 69  AST 15 - 41 U/L 25  ALT 0 - 44 U/L 18   CBC Latest Ref Rng & Units 07/30/2018  WBC 3.6 - 11.0 K/uL 8.5  Hemoglobin 12.0 - 16.0 g/dL 12.2  Hematocrit 35.0 - 47.0 % 35.4  Platelets 150 - 440 K/uL 269    Assessment and plan- Patient is a 37 y.o. female presents for evaluation of newly diagnosed multicentric right breast cancer.  Cancer Staging Breast cancer of upper-outer quadrant of right female breast [facility]) Staging form: Breast, AJCC 8th Edition - Clinical: G2, ER+, PR+, HER2- - Signed by Earlie Server, MD on 04/05/2018 - Pathologic stage from 04/04/2018: No Stage Recommended (ypT3, pN2, cM0, G3, ER+, PR-, HER2-) - Signed by Earlie Server, MD on 04/04/2018 - Pathologic: No stage assigned - Unsigned     1. Malignant neoplasm of overlapping sites of right breast in female, estrogen receptor positive ([place])   2. Menopause   3. Aromatase inhibitor use   4. Port-A-Cath in place     #Breast cancer, counts acceptable.   Since patient is not going to start on radiation in until 3 to 4 weeks later, I recommend patient to start on letrozole 2.5 mg daily. She can stop letrozole when she is ready to start on radiation.  She can resume letrozole after radiation is completed. Patient has bilateral nephrectomy, in menopause state.  #  bilateral oophorectomy, Obtain bone density study.  Patient has had received Zometa on 05/14/2018, due in December.  Plan bisphosphonate every 6 months for 2 years. Recommend patient to continue take OTC calcium and vitamin D supplements.  #Follow-up with radiation oncology to start radiation.  #Anemia resolved. #  Medi port flush every 8 weeks, will schedule for the next 6 months.  #refer to NATALEE and other clinical trial. .   Follow up in 4 weeks.   Total face to face encounter time for this patient visit was 25 min. >50% of the time was  spent in counseling and coordination of care.  Earlie Server, MD, PhD Hematology Oncology [facility] Pager- [phone] 07/30/18

## 2018-07-30 NOTE — Progress Notes (Signed)
Patient here for follow up. She states she has quit smoking since 4 days ago.

## 2018-08-03 ENCOUNTER — Encounter: Payer: Self-pay | Admitting: Oncology

## 2018-08-07 ENCOUNTER — Encounter: Payer: Self-pay | Admitting: Oncology

## 2018-08-07 ENCOUNTER — Other Ambulatory Visit: Payer: Self-pay | Admitting: Oncology

## 2018-08-07 DIAGNOSIS — M7989 Other specified soft tissue disorders: Secondary | ICD-10-CM

## 2018-08-07 MED ORDER — GABAPENTIN 300 MG PO CAPS: 600.0000 mg | ORAL_CAPSULE | Freq: Two times a day (BID) | ORAL | 0 refills | Status: DC

## 2018-08-07 NOTE — Progress Notes (Signed)
2d 

## 2018-08-15 ENCOUNTER — Ambulatory Visit
Admission: RE | Admit: 2018-08-15 | Discharge: 2018-08-15 | Disposition: A | Discharge status: Home / Self Care | Payer: BLUE CROSS/BLUE SHIELD | Source: Ambulatory Visit | Attending: Oncology | Admitting: Oncology

## 2018-08-15 ENCOUNTER — Encounter: Payer: Self-pay | Admitting: Oncology

## 2018-08-15 ENCOUNTER — Ambulatory Visit: Payer: BLUE CROSS/BLUE SHIELD | Admitting: Radiation Oncology

## 2018-08-15 DIAGNOSIS — R011 Cardiac murmur, unspecified: Secondary | ICD-10-CM | POA: Insufficient documentation

## 2018-08-15 DIAGNOSIS — J449 Chronic obstructive pulmonary disease, unspecified: Secondary | ICD-10-CM | POA: Diagnosis not present

## 2018-08-15 DIAGNOSIS — Z853 Personal history of malignant neoplasm of breast: Secondary | ICD-10-CM | POA: Insufficient documentation

## 2018-08-15 DIAGNOSIS — M7989 Other specified soft tissue disorders: Secondary | ICD-10-CM

## 2018-08-15 DIAGNOSIS — Z9221 Personal history of antineoplastic chemotherapy: Secondary | ICD-10-CM | POA: Insufficient documentation

## 2018-08-15 NOTE — Progress Notes (Signed)
*  PRELIMINARY RESULTS* Echocardiogram 2D Echocardiogram has been performed.  Theresa Chaney Jaegar Croft 08/15/2018, 11:28 AM

## 2018-08-16 ENCOUNTER — Other Ambulatory Visit: Payer: Self-pay | Admitting: Oncology

## 2018-08-16 ENCOUNTER — Other Ambulatory Visit: Payer: Self-pay | Admitting: *Deleted

## 2018-08-16 MED ORDER — PREGABALIN 300 MG PO CAPS: 300.0000 mg | ORAL_CAPSULE | Freq: Every day | ORAL | 0 refills | Status: DC

## 2018-08-16 NOTE — Telephone Encounter (Signed)
[place] [facility] cannot accept escript - must print and fax.    Printed Lyrica rx and faxed to [place] [facility] pharmacy as requested.

## 2018-08-20 ENCOUNTER — Ambulatory Visit
Admission: RE | Admit: 2018-08-20 | Discharge: 2018-08-20 | Disposition: A | Discharge status: Home / Self Care | Payer: BLUE CROSS/BLUE SHIELD | Source: Ambulatory Visit | Attending: Radiation Oncology | Admitting: Radiation Oncology

## 2018-08-20 ENCOUNTER — Other Ambulatory Visit: Payer: Self-pay

## 2018-08-20 ENCOUNTER — Encounter: Payer: Self-pay | Admitting: Radiation Oncology

## 2018-08-20 VITALS — BP 125/76 | HR 89 | Temp 96.3°F | Resp 18 | Wt 177.9 lb

## 2018-08-20 DIAGNOSIS — Z923 Personal history of irradiation: Secondary | ICD-10-CM | POA: Insufficient documentation

## 2018-08-20 DIAGNOSIS — C50811 Malignant neoplasm of overlapping sites of right female breast: Secondary | ICD-10-CM

## 2018-08-20 DIAGNOSIS — Z170 Estrogen receptor positive status [ER+]: Principal | ICD-10-CM

## 2018-08-20 DIAGNOSIS — Z9011 Acquired absence of right breast and nipple: Secondary | ICD-10-CM | POA: Insufficient documentation

## 2018-08-20 DIAGNOSIS — Z9221 Personal history of antineoplastic chemotherapy: Secondary | ICD-10-CM | POA: Insufficient documentation

## 2018-08-20 DIAGNOSIS — Z79811 Long term (current) use of aromatase inhibitors: Secondary | ICD-10-CM | POA: Diagnosis not present

## 2018-08-20 NOTE — Progress Notes (Signed)
Radiation Oncology Follow up Note  Name: Theresa Chaney   Date:   08/20/2018 MRN:  [id number] DOB: [birth year]    This 37 y.o. female presents to the clinic today for initiation of radiation therapy to right chest and peripheral lymphatics the patient was stage IIIa ER positive PR negative HER-2/neu negative invasive mammary carcinoma status post right modified radical mastectomy and breast reconstruction.  REFERRING PROVIDER: Juluis Pitch, MD  HPI: patient is a 37 year old female originally consult back in July 2019..she presented with a self discovered large right breast mass stereotactic biopsy showing high-grade ductal carcinoma in situ as well as invasive carcinoma with lymphovascular invasion. Tumor was ER positive PR negative HER-2/neu not overexpressed. She underwent neoadjuvant chemotherapy with no significant response then underwent a right modified radical mastectomy. Tumor at that time measured 8.7 cm with margins close at 1-2 mm from the deep margin. For of 16 axillary lymph nodes were positive for metastatic disease she went on to have additional 11 cycles of Taxol and then underwent breast reconstruction. She is seen today to initiate radiation therapy she is doing well breast reconstructionis excellent with excellent conformer the of the contralateral side.because of the lay she has been started on letrozole will ask her to discontinue that during radiation.patient specifically denies any chest wall pain or tenderness or any swelling of her right upper extremity.  COMPLICATIONS OF TREATMENT: none  FOLLOW UP COMPLIANCE: keeps appointments   PHYSICAL EXAM:  BP 125/76 (Patient Position: Sitting)   Pulse 89   Temp (!) 96.3 F (35.7 C) (Tympanic)   Resp 18   Wt 177 lb 14.6 oz (80.7 kg)   LMP  (LMP Unknown) Comment: Ovaries and tubes removed.  BMI 27.86 kg/m  Patient hasreconstructed right breast. No dominant mass or nodularity is noted in the bilateral breasts. No axillary or  supraclavicular adenopathy is appreciated. There is no nipple areolar complex in the right breast.Well-developed well-nourished patient in NAD. HEENT reveals PERLA, EOMI, discs not visualized.  Oral cavity is clear. No oral mucosal lesions are identified. Neck is clear without evidence of cervical or supraclavicular adenopathy. Lungs are clear to A&P. Cardiac examination is essentially unremarkable with regular rate and rhythm without murmur rub or thrill. Abdomen is benign with no organomegaly or masses noted. Motor sensory and DTR levels are equal and symmetric in the upper and lower extremities. Cranial nerves II through XII are grossly intact. Proprioception is intact. No peripheral adenopathy or edema is identified. No motor or sensory levels are noted. Crude visual fields are within normal range.  RADIOLOGY RESULTS: no current films for review  PLAN: at this time I to go ahead with right chest wall and peripheral lymphatic radiation will treat to 5040 cGy in 28 fractions.risks and benefits of treatment including skin reaction fatigue alteration of blood counts possible inclusion of superficial lung and chance of lymphedema of her right upper extremity all were discussed in detail. I do not see rationale for boosting her scar since there is nosignificant radiologic findings to guide my field placement. I have personally set up and ordered CT simulation for later this week. Also will discontinue her letrozole during radiation and resume afterwards.Patient seems to comprehend my treatment plan well.  I would like to take this opportunity to thank you for allowing me to participate in the care of your patient.Noreene Filbert, MD

## 2018-08-22 ENCOUNTER — Encounter: Payer: Self-pay | Admitting: Internal Medicine

## 2018-08-22 ENCOUNTER — Ambulatory Visit
Admission: RE | Admit: 2018-08-22 | Discharge: 2018-08-22 | Disposition: A | Discharge status: Home / Self Care | Payer: BLUE CROSS/BLUE SHIELD | Source: Ambulatory Visit | Attending: Radiation Oncology | Admitting: Radiation Oncology

## 2018-08-22 ENCOUNTER — Ambulatory Visit (INDEPENDENT_AMBULATORY_CARE_PROVIDER_SITE_OTHER): Payer: BLUE CROSS/BLUE SHIELD | Admitting: Internal Medicine

## 2018-08-22 VITALS — BP 112/64 | HR 78 | Ht 67.0 in | Wt 180.5 lb

## 2018-08-22 DIAGNOSIS — Z170 Estrogen receptor positive status [ER+]: Secondary | ICD-10-CM | POA: Diagnosis not present

## 2018-08-22 DIAGNOSIS — I429 Cardiomyopathy, unspecified: Secondary | ICD-10-CM | POA: Diagnosis not present

## 2018-08-22 DIAGNOSIS — C50411 Malignant neoplasm of upper-outer quadrant of right female breast: Secondary | ICD-10-CM | POA: Insufficient documentation

## 2018-08-22 DIAGNOSIS — Z01810 Encounter for preprocedural cardiovascular examination: Secondary | ICD-10-CM | POA: Diagnosis not present

## 2018-08-22 DIAGNOSIS — Z510 Encounter for antineoplastic radiation therapy: Secondary | ICD-10-CM | POA: Diagnosis not present

## 2018-08-22 DIAGNOSIS — R002 Palpitations: Secondary | ICD-10-CM

## 2018-08-22 DIAGNOSIS — R000 Tachycardia, unspecified: Secondary | ICD-10-CM | POA: Diagnosis not present

## 2018-08-22 DIAGNOSIS — R0602 Shortness of breath: Secondary | ICD-10-CM

## 2018-08-22 MED ORDER — METOPROLOL SUCCINATE ER 25 MG PO TB24: 12.5000 mg | ORAL_TABLET | Freq: Every day | ORAL | 3 refills | Status: DC

## 2018-08-22 NOTE — Progress Notes (Signed)
Follow-up Outpatient Visit Date: 08/22/2018  Primary Care Provider: Juluis Pitch, MD [address] [id number]  Chief Complaint: Follow-up tachycardia and abnormal echo  HPI:  Theresa Chaney is a 37 y.o. year-old female with history of breast cancer status post mastectomy and chemotherapy, who presents for follow-up of tachycardia.  I met her in late July for evaluation of elevated heart rates and palpitations.  Preceding echoes in 10/2017 and 04/2018 both showed normal LVEF.  Heart rate was upper normal at the time of our visit.  Tachycardia and chronic dyspnea on exertion were felt to be due to her malignancy and recently completed chemotherapy.  No further intervention was undertaken at that time.  Due to leg swelling, repeat echocardiogram was obtained a week ago and was reported to have interval decline in LVEF to 45%.  There was also question of anteroseptal hypokinesis and moderate right ventricular enlargement.  Today, Theresa Chaney reports that her heart rate is gradually coming down.  She has not had any readings at home over 100 bpm in several weeks.  She continues to feel fatigued with exertional dyspnea when walking for extended periods or going up a few steps.  She denies chest pain, palpitations, lightheadedness, and edema.  She is scheduled to undergo radiation therapy in the near future.  Theresa Chaney his primary complaint is of pain and swelling in both hands.  She describes the pain as a severe throbbing in the bones.  This has been attributed to her chemotherapy.  She did not have any improvement with gabapentin and was recently switched to Lyrica.  This hand swelling is what prompted the recent echocardiogram.  --------------------------------------------------------------------------------------------------  Cardiovascular History & Procedures: Cardiovascular Problems:  Shortness of breath  Elevated heart rate  Risk Factors:  None  Cath/PCI:  None  CV  Surgery:  None  EP Procedures and Devices:  None  Non-Invasive Evaluation(s):  TTE (08/15/2018): Mildly dilated LV with LVEF of 45% and diffuse hypokinesis.  Possible hypokinesis of the anteroseptal myocardium.  Grade 1 diastolic dysfunction.  Mild MR.  Mild left atrial enlargement.  Moderately dilated RV.  Mild right atrial enlargement.  TTE (04/26/2018): Normal LV size and wall thickness.  LVEF 55 to 60% with normal wall motion.  Normal diastolic function.  Normal RV size and function.  No significant valvular abnormalities.  TTE (10/26/2017): Normal LV size.  LVEF 60 to 65% with normal wall motion.  Grade 1 diastolic dysfunction.  Normal RV size and function.  Normal pulmonary artery pressure.  Recent CV Pertinent Labs: Lab Results  Component Value Date   INR 1.04 12/11/2017   K 3.8 07/30/2018   BUN 19 07/30/2018   CREATININE 0.62 07/30/2018    Past medical and surgical history were reviewed and updated in EPIC.  Current Meds  Medication Sig  . albuterol (PROVENTIL HFA;VENTOLIN HFA) 108 (90 Base) MCG/ACT inhaler Inhale 2 puffs into the lungs every 6 (six) hours as needed for wheezing or shortness of breath (Cough).  . ALPRAZolam (XANAX) 0.25 MG tablet Take 0.25 mg at bedtime as needed by mouth for anxiety or sleep.   . Calcium Carb-Cholecalciferol (CALCIUM 600 + D PO) Take 1 tablet daily by mouth.  . chlorhexidine (PERIDEX) 0.12 % solution USE AS DIRECTED 15MLS IN THE MOUTH OR THROAT TWICE A DAY  . doxylamine, Sleep, (UNISOM) 25 MG tablet Take 25 mg by mouth at bedtime as needed for sleep.  [name] escitalopram (LEXAPRO) 20 MG tablet Take 20 mg by mouth  every morning.   [name] esomeprazole (NEXIUM) 40 MG capsule Take 40 mg by mouth every morning.   [name] ibuprofen (ADVIL,MOTRIN) 800 MG tablet Take 1 tablet (800 mg total) by mouth every 8 (eight) hours as needed for moderate pain.  [name] letrozole (FEMARA) 2.5 MG tablet Take 1 tablet (2.5 mg total) by mouth daily.  [name] loratadine (CLARITIN) 10 MG  tablet Take 10 mg by mouth daily.   [name] LORazepam (ATIVAN) 1 MG tablet Take 1 mg by mouth every 8 (eight) hours.  . pregabalin (LYRICA) 300 MG capsule Take 1 capsule (300 mg total) by mouth daily.  . prochlorperazine (COMPAZINE) 10 MG tablet Take 1 tablet (10 mg total) by mouth every 6 (six) hours as needed (Nausea or vomiting).  . promethazine (PHENERGAN) 25 MG tablet Take 1 tablet (25 mg total) by mouth every 6 (six) hours as needed for nausea or vomiting.  . pyridOXINE (VITAMIN B-6) 100 MG tablet Take 1 tablet (100 mg total) by mouth daily.  . vitamin C (ASCORBIC ACID) 500 MG tablet Take 500 mg by mouth daily.    Allergies: Patient has no known allergies.  Social History   Tobacco Use  . Smoking status: Former Smoker    Packs/day: 0.50    Years: 18.00    Pack years: 9.00    Types: Cigarettes    Start date: 06/21/2018  . Smokeless tobacco: Never Used  Substance Use Topics  . Alcohol use: No    Frequency: Never  . Drug use: No    Family History  Problem Relation Age of Onset  . Melanoma Maternal Aunt        other aunts with BCC/SCC/Melanoma  . Diabetes Father   . Hypertension Father   . Hyperlipidemia Father   . Heart attack Father 68       "mild"  . Bladder Cancer Maternal Grandmother   . Cervical cancer Maternal Aunt 64       daughter w/ cervical cancer as well  . Melanoma Maternal Uncle        other uncles with BCC/SCC/Melanoma    Review of Systems: A 12-system review of systems was performed and was negative except as noted in the HPI.  --------------------------------------------------------------------------------------------------  Physical Exam: BP 112/64 (BP Location: Left Arm, Patient Position: Sitting, Cuff Size: Normal)   Pulse 78   Ht _0  (1.702 m)   Wt 180 lb 8 oz (81.9 kg)   LMP  (LMP Unknown) Comment: Ovaries and tubes removed.  BMI 28.27 kg/m   General: NAD. HEENT: No conjunctival pallor or scleral icterus. Moist mucous membranes.  OP  clear. Neck: Supple without lymphadenopathy, thyromegaly, JVD, or HJR. Lungs: Normal work of breathing. Clear to auscultation bilaterally without wheezes or crackles. Heart: Regular rate and rhythm without murmurs, rubs, or gallops. Non-displaced PMI. Abd: Bowel sounds present. Soft, NT/ND without hepatosplenomegaly Ext: No lower extremity edema.  Mild diffuse hand and finger edema noted bilaterally. Skin: Warm and dry without rash.  EKG: Normal sinus rhythm without abnormalities.  Lab Results  Component Value Date   WBC 8.5 07/30/2018   HGB 12.2 07/30/2018   HCT 35.4 07/30/2018   MCV 91.4 07/30/2018   PLT 269 07/30/2018    Lab Results  Component Value Date   NA 138 07/30/2018   K 3.8 07/30/2018   CL 104 07/30/2018   CO2 23 07/30/2018   BUN 19 07/30/2018   CREATININE 0.62 07/30/2018   GLUCOSE 130 (H) 07/30/2018   ALT 18 07/30/2018  No results found for: CHOL, HDL, LDLCALC, LDLDIRECT, TRIG, CHOLHDL  --------------------------------------------------------------------------------------------------  ASSESSMENT AND PLAN: Cardiomyopathy and shortness of breath Recent echo reported interval decline in LVEF to ~45%.  I have personally reviewed the images and feel that the LVEF is probably closer to 50 to 55%.  This is low normal but down slightly from prior echoes.  The patient has chronic exertional dyspnea which is likely multifactorial.  I am not convinced that her hand pain and swelling is primarily cardiac in nature.  We have agreed to perform a pharmacologic myocardial perfusion stress test to exclude underlying ischemia (though low risk based on age and lack of coronary calcium on prior CTs) and also reassess her LVEF on the gated images.  We have agreed to start metoprolol succinate 12.5 mg daily for evidence-based heart failure therapy.  If she tolerates this well, gradual up titration and addition of an ACE inhibitor or ARB is advisable to improve or at least maintain LVEF  given prior chemotherapy.  Tachycardia and palpitations Improving.  As above, we will start low-dose metoprolol and obtain a pharmacologic myocardial perfusion stress test.  Breast cancer Continue close follow-up with Dr. Tasia Catchings.  Follow-up: Return to clinic in 1 month.Harrell Gave Ledell Codrington, MD 08/22/2018 3:22 PM

## 2018-08-22 NOTE — Patient Instructions (Addendum)
Medication Instructions:  Your physician has recommended you make the following change in your medication:  1- START Metoprolol succinate 12.5 mg (1/2 tablet) by mouth once a day.   Labwork: none  Testing/Procedures: [place]  Your caregiver has ordered a Stress Test with nuclear imaging. The purpose of this test is to evaluate the blood supply to your heart muscle. This procedure is referred to as a "Non-Invasive Stress Test." This is because other than having an IV started in your vein, nothing is inserted or "invades" your body. Cardiac stress tests are done to find areas of poor blood flow to the heart by determining the extent of coronary artery disease (CAD). Some patients exercise on a treadmill, which naturally increases the blood flow to your heart, while others who are  unable to walk on a treadmill due to physical limitations have a pharmacologic/chemical stress agent called Lexiscan . This medicine will mimic walking on a treadmill by temporarily increasing your coronary blood flow.   Please note: these test may take anywhere between 2-4 hours to complete  PLEASE REPORT TO [place] AT THE FIRST DESK WILL DIRECT YOU WHERE TO GO  Date of Procedure:_____________________________________  Arrival Time for Procedure:______________________________    PLEASE NOTIFY THE OFFICE AT LEAST 24 HOURS IN ADVANCE IF YOU ARE UNABLE TO KEEP YOUR APPOINTMENT.  [phone] AND  PLEASE NOTIFY NUCLEAR MEDICINE AT [facility] AT LEAST 24 HOURS IN ADVANCE IF YOU ARE UNABLE TO KEEP YOUR APPOINTMENT. [phone]  How to prepare for your Myoview test:  1. Do not eat or drink after midnight 2. No caffeine for 24 hours prior to test 3. No smoking 24 hours prior to test. 4. Your medication may be taken with water.  If your doctor stopped a medication because of this test, do not take that medication. 5. Ladies, please do not wear dresses.  Skirts or pants are  appropriate. Please wear a short sleeve shirt. 6. No perfume, cologne or lotion. 7. Wear comfortable walking shoes. No heels!   Follow-Up: Your physician recommends that you schedule a follow-up appointment in: 1 MONTH WITH APP.  If you need a refill on your cardiac medications before your next appointment, please call your pharmacy.    Cardiac Nuclear Scan A cardiac nuclear scan is a test that measures blood flow to the heart when a person is resting and when he or she is exercising. The test looks for problems such as:  Not enough blood reaching a portion of the heart.  The heart muscle not working normally.  You may need this test if:  You have heart disease.  You have had abnormal lab results.  You have had heart surgery or angioplasty.  You have chest pain.  You have shortness of breath.  In this test, a radioactive dye (tracer) is injected into your bloodstream. After the tracer has traveled to your heart, an imaging device is used to measure how much of the tracer is absorbed by or distributed to various areas of your heart. This procedure is usually done at a hospital and takes 2-4 hours. Tell a health care provider about:  Any allergies you have.  All medicines you are taking, including vitamins, herbs, eye drops, creams, and over-the-counter medicines.  Any problems you or family members have had with the use of anesthetic medicines.  Any blood disorders you have.  Any surgeries you have had.  Any medical conditions you have.  Whether you are pregnant  or may be pregnant. What are the risks? Generally, this is a safe procedure. However, problems may occur, including:  Serious chest pain and heart attack. This is only a risk if the stress portion of the test is done.  Rapid heartbeat.  Sensation of warmth in your chest. This usually passes quickly.  What happens before the procedure?  Ask your health care provider about changing or stopping your  regular medicines. This is especially important if you are taking diabetes medicines or blood thinners.  Remove your jewelry on the day of the procedure. What happens during the procedure?  An IV tube will be inserted into one of your veins.  Your health care provider will inject a small amount of radioactive tracer through the tube.  You will wait for 20-40 minutes while the tracer travels through your bloodstream.  Your heart activity will be monitored with an electrocardiogram (ECG).  You will lie down on an exam table.  Images of your heart will be taken for about 15-20 minutes.  You may be asked to exercise on a treadmill or stationary bike. While you exercise, your heart's activity will be monitored with an ECG, and your blood pressure will be checked. If you are unable to exercise, you may be given a medicine to increase blood flow to parts of your heart.  When blood flow to your heart has peaked, a tracer will again be injected through the IV tube.  After 20-40 minutes, you will get back on the exam table and have more images taken of your heart.  When the procedure is over, your IV tube will be removed. The procedure may vary among health care providers and hospitals. Depending on the type of tracer used, scans may need to be repeated 3-4 hours later. What happens after the procedure?  Unless your health care provider tells you otherwise, you may return to your normal schedule, including diet, activities, and medicines.  Unless your health care provider tells you otherwise, you may increase your fluid intake. This will help flush the contrast dye from your body. Drink enough fluid to keep your urine clear or pale yellow.  It is up to you to get your test results. Ask your health care provider, or the department that is doing the test, when your results will be ready. Summary  A cardiac nuclear scan measures the blood flow to the heart when a person is resting and when he or  she is exercising.  You may need this test if you are at risk for heart disease.  Tell your health care provider if you are pregnant.  Unless your health care provider tells you otherwise, increase your fluid intake. This will help flush the contrast dye from your body. Drink enough fluid to keep your urine clear or pale yellow. This information is not intended to replace advice given to you by your health care provider. Make sure you discuss any questions you have with your health care provider. Document Released: 12/23/2004 Document Revised: 11/30/2016 Document Reviewed: 11/06/2013 Elsevier Interactive Patient Education  2017 [employer].

## 2018-08-23 ENCOUNTER — Ambulatory Visit
Admission: RE | Admit: 2018-08-23 | Discharge: 2018-08-23 | Disposition: A | Discharge status: Home / Self Care | Payer: BLUE CROSS/BLUE SHIELD | Source: Ambulatory Visit | Attending: Oncology | Admitting: Oncology

## 2018-08-23 ENCOUNTER — Encounter: Payer: Self-pay | Admitting: Internal Medicine

## 2018-08-23 DIAGNOSIS — Z01810 Encounter for preprocedural cardiovascular examination: Secondary | ICD-10-CM | POA: Insufficient documentation

## 2018-08-23 DIAGNOSIS — R002 Palpitations: Secondary | ICD-10-CM | POA: Insufficient documentation

## 2018-08-23 DIAGNOSIS — C50811 Malignant neoplasm of overlapping sites of right female breast: Secondary | ICD-10-CM | POA: Insufficient documentation

## 2018-08-23 DIAGNOSIS — R0602 Shortness of breath: Secondary | ICD-10-CM | POA: Insufficient documentation

## 2018-08-23 DIAGNOSIS — Z170 Estrogen receptor positive status [ER+]: Secondary | ICD-10-CM | POA: Insufficient documentation

## 2018-08-23 DIAGNOSIS — R000 Tachycardia, unspecified: Secondary | ICD-10-CM | POA: Insufficient documentation

## 2018-08-23 DIAGNOSIS — Z780 Asymptomatic menopausal state: Secondary | ICD-10-CM | POA: Diagnosis present

## 2018-08-23 DIAGNOSIS — I428 Other cardiomyopathies: Secondary | ICD-10-CM | POA: Insufficient documentation

## 2018-08-24 ENCOUNTER — Other Ambulatory Visit: Payer: Self-pay | Admitting: *Deleted

## 2018-08-24 DIAGNOSIS — C50411 Malignant neoplasm of upper-outer quadrant of right female breast: Secondary | ICD-10-CM | POA: Diagnosis not present

## 2018-08-24 DIAGNOSIS — C50811 Malignant neoplasm of overlapping sites of right female breast: Secondary | ICD-10-CM

## 2018-08-24 DIAGNOSIS — Z170 Estrogen receptor positive status [ER+]: Principal | ICD-10-CM

## 2018-08-24 NOTE — Addendum Note (Signed)
Addended by: Alba Destine on: 08/24/2018 09:21 AM   Modules accepted: Orders

## 2018-08-27 ENCOUNTER — Inpatient Hospital Stay: Payer: BLUE CROSS/BLUE SHIELD

## 2018-08-27 ENCOUNTER — Other Ambulatory Visit: Payer: Self-pay

## 2018-08-27 ENCOUNTER — Encounter: Payer: Self-pay | Admitting: Oncology

## 2018-08-27 ENCOUNTER — Inpatient Hospital Stay: Payer: BLUE CROSS/BLUE SHIELD | Attending: Oncology | Admitting: Oncology

## 2018-08-27 VITALS — BP 107/60 | HR 80 | Temp 96.2°F | Resp 18 | Wt 178.9 lb

## 2018-08-27 DIAGNOSIS — T451X5A Adverse effect of antineoplastic and immunosuppressive drugs, initial encounter: Secondary | ICD-10-CM

## 2018-08-27 DIAGNOSIS — Z808 Family history of malignant neoplasm of other organs or systems: Secondary | ICD-10-CM | POA: Diagnosis not present

## 2018-08-27 DIAGNOSIS — Z8049 Family history of malignant neoplasm of other genital organs: Secondary | ICD-10-CM | POA: Insufficient documentation

## 2018-08-27 DIAGNOSIS — Z8052 Family history of malignant neoplasm of bladder: Secondary | ICD-10-CM | POA: Diagnosis not present

## 2018-08-27 DIAGNOSIS — C50811 Malignant neoplasm of overlapping sites of right female breast: Secondary | ICD-10-CM

## 2018-08-27 DIAGNOSIS — Z79811 Long term (current) use of aromatase inhibitors: Secondary | ICD-10-CM | POA: Insufficient documentation

## 2018-08-27 DIAGNOSIS — Z170 Estrogen receptor positive status [ER+]: Secondary | ICD-10-CM

## 2018-08-27 DIAGNOSIS — Z87891 Personal history of nicotine dependence: Secondary | ICD-10-CM | POA: Diagnosis not present

## 2018-08-27 DIAGNOSIS — I5021 Acute systolic (congestive) heart failure: Secondary | ICD-10-CM | POA: Diagnosis not present

## 2018-08-27 DIAGNOSIS — G620 Drug-induced polyneuropathy: Secondary | ICD-10-CM | POA: Diagnosis not present

## 2018-08-27 DIAGNOSIS — M7989 Other specified soft tissue disorders: Secondary | ICD-10-CM | POA: Insufficient documentation

## 2018-08-27 LAB — COMPREHENSIVE METABOLIC PANEL
ALBUMIN: 4.5 g/dL (ref 3.5–5.0)
ALT: 20 U/L (ref 0–44)
AST: 23 U/L (ref 15–41)
Alkaline Phosphatase: 68 U/L (ref 38–126)
Anion gap: 9 (ref 5–15)
BILIRUBIN TOTAL: 0.4 mg/dL (ref 0.3–1.2)
BUN: 17 mg/dL (ref 6–20)
CO2: 25 mmol/L (ref 22–32)
Calcium: 9.6 mg/dL (ref 8.9–10.3)
Chloride: 104 mmol/L (ref 98–111)
Creatinine, Ser: 0.85 mg/dL (ref 0.44–1.00)
GFR calc Af Amer: >60 mL/min (ref >60)
GFR calc non Af Amer: >60 mL/min (ref >60)
GLUCOSE: 89 mg/dL (ref 70–99)
POTASSIUM: 3.9 mmol/L (ref 3.5–5.1)
Sodium: 138 mmol/L (ref 135–145)
TOTAL PROTEIN: 7.4 g/dL (ref 6.5–8.1)

## 2018-08-27 LAB — CBC WITH DIFFERENTIAL/PLATELET
Basophils Absolute: 0 10*3/uL (ref 0–0.1)
Basophils Relative: 0 %
EOS PCT: 1 %
Eosinophils Absolute: 0.1 10*3/uL (ref 0–0.7)
HCT: 37 % (ref 35.0–47.0)
Hemoglobin: 12.6 g/dL (ref 12.0–16.0)
LYMPHS ABS: 2.2 10*3/uL (ref 1.0–3.6)
LYMPHS PCT: 30 %
MCH: 30.3 pg (ref 26.0–34.0)
MCHC: 34.1 g/dL (ref 32.0–36.0)
MCV: 88.8 fL (ref 80.0–100.0)
MONO ABS: 0.7 10*3/uL (ref 0.2–0.9)
MONOS PCT: 9 %
Neutro Abs: 4.3 10*3/uL (ref 1.4–6.5)
Neutrophils Relative %: 60 %
Platelets: 285 10*3/uL (ref 150–440)
RBC: 4.17 MIL/uL (ref 3.80–5.20)
RDW: 12.4 % (ref 11.5–14.5)
WBC: 7.3 10*3/uL (ref 3.6–11.0)

## 2018-08-27 MED ORDER — TRAMADOL HCL 50 MG PO TABS: 50.0000 mg | ORAL_TABLET | Freq: Four times a day (QID) | ORAL | 0 refills | Status: DC | PRN

## 2018-08-27 NOTE — Progress Notes (Signed)
Hematology/Oncology Follow Up Note [facility] [phone] [phone]  Patient Care Team: Juluis Pitch, MD as PCP - General (Family Medicine)   Name of the patient: Theresa Chaney  [id number]  [birth year]   Date of visit: 08/27/18 REASON FOR VISIT Follow up for Assessment prior to chemotherapy treatment of breast cancer  Oncology History 10/11/2017 Diagnosed with [id number]  Neoadjuvant dose dense AC and 1 cycle of Taxol.  Interim image showed no treatment response. 03/19/2018 right mastectomy and right axillary dissection, bilateral salpingo-oophorectomy. ypT3 ypN2 #Negative genetic testing #06/11/2018 Finish adjuvant weekly Taxol x11 Grade 2 neuropathy  #right immediate breast reconstruction with placement of tissue expanders.  INTERVAL HISTORY Patient presents for follow-up of breast cancer management.  Patient has completed right breast reconstruction on 07/20/2018 and is going to start adjuvant radiation next week. #Patient's major complaint today is worsening of her bilateral hand neuropathy, numbness/tingling/burning sensation as worsened during the interval.  She has ca called and we adjusted gabapentin dosage without any improvement.  Switch to Lyrica 300 mg daily and she has been on Lyrica for about 2 to 3 weeks.  Symptoms continue to be uncontrolled.  She can barely hold anything in her hands. Also bad neuropathy of her bilateral foot/toes. #  She also reports bilateral hand swelling. This is a new problem for her. Started 2-3 weeks ago, progressively worsened. No aggravating factors or alleviating factors.   #CHF: due to the extremity swelling, I ordered 2D echo during the interval and she had the test completed. 2D echo showed decreased LVEF to 45%.  She has been referred back to see Dr. ESaunders Reveland is going to have additional cardiac work-ups.  She has been started on low-dose metoprolol   #Bone pain, reports generalized bone pain, not  relieved by taking tylenol.   Associated with hot flushes fatigue.    Cancer TREATMENT Neoadjuvant ddAC +1 dose of Taxol, due to lack of response, surgery was offered. Case was discussed on breast tumor board. 03/19/2018 S/p right mastectomy and right axillary dissection, immediate breast reconstruction with placement of expanders. Also had elective bilateral salpingo-oophorectomy..  # s/p 11 cycles Taxol adjuvantly. Tolerated well.  # .# She has obtained dental clearance for starting Zometa.  S/p Zometa on 6/3/ 2019   Review of Systems  Constitutional: Positive for malaise/fatigue. Negative for chills, diaphoresis, fever and weight loss.  HENT: Negative for congestion, ear discharge, ear pain, hearing loss, nosebleeds, sinus pain, sore throat and tinnitus.   Eyes: Negative for blurred vision, double vision, photophobia, pain, discharge and redness.  Respiratory: Negative for cough, hemoptysis, sputum production, shortness of breath and wheezing.   Cardiovascular: Negative for chest pain, palpitations, orthopnea, claudication and leg swelling.  Gastrointestinal: Negative for abdominal pain, blood in stool, constipation, diarrhea, heartburn, melena, nausea and vomiting.  Genitourinary: Negative for dysuria, flank pain, frequency, hematuria and urgency.  Musculoskeletal: Negative for back pain, myalgias and neck pain.       Hand swelling  Skin: Negative for itching and rash.  Neurological: Positive for tingling. Negative for dizziness, tremors, speech change, focal weakness, weakness and headaches.       Worsening tingling numbness burning sensation of bilateral hands and toes.  Not relieved with taking either gabapentin or Lyrica.  Endo/Heme/Allergies: Negative for environmental allergies and polydipsia. Does not bruise/bleed easily.  Psychiatric/Behavioral: Negative for depression, hallucinations and substance abuse. The patient is not nervous/anxious.     No Known Allergies  Patient Active  Problem List  Diagnosis Date Noted  . Shortness of breath 08/23/2018  . Cardiomyopathy ([place]) 08/23/2018  . Preprocedural cardiovascular examination 08/23/2018  . Tachycardia 08/23/2018  . Palpitations 08/23/2018  . Estrogen receptor positive status (ER+) 04/04/2018  . Breast cancer of upper-outer quadrant of right female breast ([place]) 03/19/2018  . Family history of cancer   . Malignant neoplasm of overlapping sites of right breast in female, estrogen receptor positive ([place]) 10/19/2017     Past Medical History:  Diagnosis Date  . Anemia   . BRCA negative 11/26/2017  . Breast cancer ([place]) 10/11/2017   Multifocal, ER positive, PR negative, HER-2 negative.  . Chronic bronchitis ([place]) 11/2017  . COPD (chronic obstructive pulmonary disease) ([place])    MILD PER CXR  . Depression   . Family history of cancer   . GERD (gastroesophageal reflux disease)   . Headache    MIGRAINES  . Heart murmur    ASYMPTOMATIC  . Personal history of chemotherapy    current for right breast ca     Past Surgical History:  Procedure Laterality Date  . AXILLARY LYMPH NODE DISSECTION Right 03/19/2018   Procedure: AXILLARY LYMPH NODE DISSECTION;  Surgeon: Robert Bellow, MD;  Location: [facility] ORS;  Service: General;  Laterality: Right;  . BREAST BIOPSY Right 10/11/2017   12:30 posterior coil clip invasive mammary carcinoma  . BREAST BIOPSY Right 10/11/2017   11:30 middle depth ribbon clip DCIS  . BREAST BIOPSY Right 10/11/2017   5:30 anterior depth x shape invasive ductal carcinoma  . BREAST RECONSTRUCTION WITH PLACEMENT OF TISSUE EXPANDER AND FLEX HD (ACELLULAR HYDRATED DERMIS) Right 03/19/2018   Procedure: BREAST RECONSTRUCTION WITH PLACEMENT OF TISSUE EXPANDER AND FLEX HD (ACELLULAR HYDRATED DERMIS);  Surgeon: Wallace Going, DO;  Location: [facility] ORS;  Service: Plastics;  Laterality: Right;  . LAPAROSCOPIC BILATERAL SALPINGO OOPHERECTOMY Bilateral 03/19/2018   Procedure: LAPAROSCOPIC BILATERAL  SALPINGO OOPHORECTOMY;  Surgeon: Benjaman Kindler, MD;  Location: [facility] ORS;  Service: Gynecology;  Laterality: Bilateral;  . MASTECTOMY Right   . MASTECTOMY W/ SENTINEL NODE BIOPSY Right 03/19/2018   Procedure: MASTECTOMY WITH SENTINEL LYMPH NODE BIOPSY;  Surgeon: Robert Bellow, MD;  Location: [facility] OR[facility];  Service: General;  Laterality: Right;  . PORTACATH PLACEMENT Left 10/24/2017   Procedure: INSERTION PORT-A-CATH;  Surgeon: Robert Bellow, MD;  Location: [facility] ORS;  Service: General;  Laterality: Left;  . REMOVAL OF TISSUE EXPANDER AND PLACEMENT OF IMPLANT Right 07/20/2018   Procedure: REMOVAL OF RIGHT BREAST TISSUE EXPANDER AND PLACEMENT OF IMPLANT;  Surgeon: Wallace Going, DO;  Location: [place];  Service: Plastics;  Laterality: Right;    Social History   Socioeconomic History  . Marital status: Married    Spouse name: Not on file  . Number of children: Not on file  . Years of education: Not on file  . Highest education level: Not on file  Occupational History  . Occupation: Occupational psychologist    Comment: [employer]  . Financial resource strain: Not on file  . Food insecurity:    Worry: Not on file    Inability: Not on file  . Transportation needs:    Medical: Not on file    Non-medical: Not on file  Tobacco Use  . Smoking status: Former Smoker    Packs/day: 0.50    Years: 18.00    Pack years: 9.00    Types: Cigarettes    Start date: 06/21/2018  . Smokeless  tobacco: Never Used  Substance and Sexual Activity  . Alcohol use: No    Frequency: Never  . Drug use: No  . Sexual activity: Yes    Birth control/protection: Injection  Lifestyle  . Physical activity:    Days per week: Not on file    Minutes per session: Not on file  . Stress: Not on file  Relationships  . Social connections:    Talks on phone: Not on file    Gets together: Not on file    Attends religious service: Not on file    Active  member of club or organization: Not on file    Attends meetings of clubs or organizations: Not on file    Relationship status: Not on file  . Intimate partner violence:    Fear of current or ex partner: Not on file    Emotionally abused: Not on file    Physically abused: Not on file    Forced sexual activity: Not on file  Other Topics Concern  . Not on file  Social History Narrative  . Not on file     Family History  Problem Relation Age of Onset  . Melanoma Maternal Aunt        other aunts with BCC/SCC/Melanoma  . Diabetes Father   . Hypertension Father   . Hyperlipidemia Father   . Heart attack Father 42       "mild"  . Bladder Cancer Maternal Grandmother   . Cervical cancer Maternal Aunt 64       daughter w/ cervical cancer as well  . Melanoma Maternal Uncle        other uncles with BCC/SCC/Melanoma     Current Outpatient Medications:  .  albuterol (PROVENTIL HFA;VENTOLIN HFA) 108 (90 Base) MCG/ACT inhaler, Inhale 2 puffs into the lungs every 6 (six) hours as needed for wheezing or shortness of breath (Cough)., Disp: 1 Inhaler, Rfl: 2 .  ALPRAZolam (XANAX) 0.25 MG tablet, Take 0.25 mg at bedtime as needed by mouth for anxiety or sleep. , Disp: , Rfl:  .  Calcium Carb-Cholecalciferol (CALCIUM 600 + D PO), Take 1 tablet daily by mouth., Disp: , Rfl:  .  chlorhexidine (PERIDEX) 0.12 % solution, USE AS DIRECTED 15MLS IN THE MOUTH OR THROAT TWICE A DAY, Disp: 473 mL, Rfl: 1 .  doxylamine, Sleep, (UNISOM) 25 MG tablet, Take 25 mg by mouth at bedtime as needed for sleep., Disp: , Rfl:  .  escitalopram (LEXAPRO) 20 MG tablet, Take 20 mg by mouth every morning. , Disp: , Rfl:  .  esomeprazole (NEXIUM) 40 MG capsule, Take 40 mg by mouth every morning. , Disp: , Rfl:  .  ibuprofen (ADVIL,MOTRIN) 800 MG tablet, Take 1 tablet (800 mg total) by mouth every 8 (eight) hours as needed for moderate pain., Disp: 30 tablet, Rfl: 1 .  letrozole (FEMARA) 2.5 MG tablet, Take 1 tablet (2.5 mg  total) by mouth daily., Disp: 90 tablet, Rfl: 0 .  loratadine (CLARITIN) 10 MG tablet, Take 10 mg by mouth daily. , Disp: , Rfl:  .  LORazepam (ATIVAN) 1 MG tablet, Take 1 mg by mouth every 8 (eight) hours., Disp: , Rfl:  .  metoprolol succinate (TOPROL XL) 25 MG 24 hr tablet, Take 0.5 tablets (12.5 mg total) by mouth daily., Disp: 45 tablet, Rfl: 3 .  pregabalin (LYRICA) 300 MG capsule, Take 1 capsule (300 mg total) by mouth daily., Disp: 30 capsule, Rfl: 0 .  pyridOXINE (VITAMIN B-6) 100  MG tablet, Take 1 tablet (100 mg total) by mouth daily., Disp: 30 tablet, Rfl: 1 .  vitamin C (ASCORBIC ACID) 500 MG tablet, Take 500 mg by mouth daily., Disp: , Rfl:  .  prochlorperazine (COMPAZINE) 10 MG tablet, Take 1 tablet (10 mg total) by mouth every 6 (six) hours as needed (Nausea or vomiting). (Patient not taking: Reported on 08/27/2018), Disp: 30 tablet, Rfl: 1 .  promethazine (PHENERGAN) 25 MG tablet, Take 1 tablet (25 mg total) by mouth every 6 (six) hours as needed for nausea or vomiting. (Patient not taking: Reported on 08/22/2018), Disp: 30 tablet, Rfl: 0 .  traMADol (ULTRAM) 50 MG tablet, Take 1 tablet (50 mg total) by mouth every 6 (six) hours as needed for moderate pain or severe pain., Disp: 30 tablet, Rfl: 0   Physical exam:  Vitals:   08/27/18 1344  BP: 107/60  Pulse: 80  Resp: 18  Temp: (!) 96.2 F (35.7 C)  TempSrc: Tympanic  Weight: 178 lb 14.4 oz (81.1 kg)  ECOG 1 Physical Exam  Constitutional: She is oriented to person, place, and time. No distress.  HENT:  Head: Normocephalic and atraumatic.  Nose: Nose normal.  Mouth/Throat: Oropharynx is clear and moist. No oropharyngeal exudate.  No thrush,  Eyes: Pupils are equal, round, and reactive to light. Conjunctivae and EOM are normal. Left eye exhibits no discharge. No scleral icterus.  Neck: Normal range of motion. Neck supple. No JVD present.  Cardiovascular: Normal rate, regular rhythm and normal heart sounds.  No murmur  heard. Pulmonary/Chest: Effort normal and breath sounds normal. No respiratory distress. She has no wheezes. She has no rales. She exhibits no tenderness.  Abdominal: Soft. Bowel sounds are normal. She exhibits no distension and no mass. There is no tenderness. There is no rebound.  Musculoskeletal: Normal range of motion.  Bilateral hand swelling.   Lymphadenopathy:    She has no cervical adenopathy.  Neurological: She is alert and oriented to person, place, and time. No cranial nerve deficit. She exhibits normal muscle tone.  Skin: Skin is warm and dry. No rash noted. She is not diaphoretic. No erythema.  Psychiatric: Affect normal.       CMP Latest Ref Rng & Units 08/27/2018  Glucose 70 - 99 mg/dL 89  BUN 6 - 20 mg/dL 17  Creatinine 0.44 - 1.00 mg/dL 0.85  Sodium 135 - 145 mmol/L 138  Potassium 3.5 - 5.1 mmol/L 3.9  Chloride 98 - 111 mmol/L 104  CO2 22 - 32 mmol/L 25  Calcium 8.9 - 10.3 mg/dL 9.6  Total Protein 6.5 - 8.1 g/dL 7.4  Total Bilirubin 0.3 - 1.2 mg/dL 0.4  Alkaline Phos 38 - 126 U/L 68  AST 15 - 41 U/L 23  ALT 0 - 44 U/L 20   CBC Latest Ref Rng & Units 08/27/2018  WBC 3.6 - 11.0 K/uL 7.3  Hemoglobin 12.0 - 16.0 g/dL 12.6  Hematocrit 35.0 - 47.0 % 37.0  Platelets 150 - 440 K/uL 285   RADIOGRAPHIC STUDIES: I have personally reviewed the radiological images as listed and agreed with the findings in the report. 08/23/2018 DEXA The BMD measured at Femur Neck Right is 0.900 g/cm2 with a T-score of -1.0. This patient is considered normal according to [facility]) criteria. Site Region Measured Measured WHO Young Adult BMD Date       Age      Classification T-score AP Spine L1-L4 08/23/2018 35.5 Normal -0.8 1.091 g/cm2 DualFemur Neck Right  08/23/2018 35.5 Normal -1.0 0.900 g/cm2  08/15/2018 2D echo Study Conclusions - Left ventricle: The cavity size was mildly dilated. Systolic   function was mildly reduced. The estimated ejection fraction  was   45%. Diffuse hypokinesis. Hypokinesis of the anteroseptal   myocardium. Doppler parameters are consistent with abnormal left   ventricular relaxation (grade 1 diastolic dysfunction). - Aortic valve: Valve area (VTI): 1.88 cm^2. Valve area (Vmax): 1.8   cm^2. Valve area (Vmean): 1.74 cm^2. - Mitral valve: There was mild regurgitation. Valve area by   continuity equation (using LVOT flow): 2.22 cm^2. - Left atrium: The atrium was mildly dilated. - Right ventricle: The cavity size was moderately dilated. - Right atrium: The atrium was mildly dilated.   Assessment and plan- Patient is a 37 y.o. female presents for evaluation of newly diagnosed multicentric right breast cancer.  Cancer Staging Breast cancer of upper-outer quadrant of right female breast [facility]) Staging form: Breast, AJCC 8th Edition - Clinical: G2, ER+, PR+, HER2- - Signed by Earlie Server, MD on 04/05/2018 - Pathologic stage from 04/04/2018: No Stage Recommended (ypT3, pN2, cM0, G3, ER+, PR-, HER2-) - Signed by Earlie Server, MD on 04/04/2018 - Pathologic: No stage assigned - Unsigned     1. Malignant neoplasm of overlapping sites of right breast in female, estrogen receptor positive ([place])   2. Aromatase inhibitor use   3. Neuropathy due to chemotherapeutic drug ([place])   4. Acute systolic congestive heart failure ([place])   5. Bilateral hand swelling     #Breast cancer,  Labs reviewed and discussed with patient. Counts are stable. Anemia completely resolved.   # Worsening neuropathy, ? Chemotherapy induced.  On Lyrica 339m daily for 2-3 weeks without relief of symptoms.  Add Tramadol 556mQ6h PRN.   # Hand swelling, bilaterally.  Refer to Physical therapy for lymphedema evaluation [ right axillary LN dissection].   # Generalized bone pain, likely due to monopause state induced by bilateral oophorectomy/ letrozole use,. Since she is about to start RT, I advise patient to stop letrozole now. She can restart letrozole after  finishes RT.  Continue calcium and vitamin D supplements.  Plan adjuvant bisphosphonate every 6 months for 2 years.  #Follow-up with radiation oncology to start radiation.  # CHF: LVEF has decreased to 45% She has been evaluated by cardiology and will have additional work up done. On beta blocker  # Medi port flush every 8 weeks, will schedule for the next 6 months.  #May not be eligible for NATALEE clinical trial due to ? CHF. Awaiting cardiology work up. .   Follow up in 2 weeks for assessement .    ZhEarlie ServerMD, PhD Hematology Oncology [facility]- [phone]/16/19

## 2018-08-27 NOTE — Progress Notes (Signed)
Patient here for follow. She states that pain to hands/feet is getting worse and constant, and she also has bone pain to shoulders. Unable to sleep because pain wakes her up.

## 2018-08-29 ENCOUNTER — Inpatient Hospital Stay: Payer: BLUE CROSS/BLUE SHIELD | Admitting: Occupational Therapy

## 2018-08-29 ENCOUNTER — Encounter: Payer: Self-pay | Admitting: Occupational Therapy

## 2018-08-29 ENCOUNTER — Other Ambulatory Visit: Payer: Self-pay

## 2018-08-29 ENCOUNTER — Encounter: Payer: Self-pay | Admitting: Oncology

## 2018-08-29 DIAGNOSIS — G620 Drug-induced polyneuropathy: Secondary | ICD-10-CM

## 2018-08-29 DIAGNOSIS — T451X5A Adverse effect of antineoplastic and immunosuppressive drugs, initial encounter: Principal | ICD-10-CM

## 2018-08-29 NOTE — Therapy (Unsigned)
[facility] [address] [place], [place], [place], [address] Phone: [phone]   Fax:  [phone]  Occupational Therapy Screen  Patient Details  Name: Theresa Chaney MRN: [id number] Date of Birth: 12-04-1981 No data recorded  Encounter Date: 08/29/2018    Past Medical History:  Diagnosis Date  . Anemia   . BRCA negative 11/26/2017  . Breast cancer ([place]) 10/11/2017   Multifocal, ER positive, PR negative, HER-2 negative.  . Chronic bronchitis ([place]) 11/2017  . COPD (chronic obstructive pulmonary disease) ([place])    MILD PER CXR  . Depression   . Family history of cancer   . GERD (gastroesophageal reflux disease)   . Headache    MIGRAINES  . Heart murmur    ASYMPTOMATIC  . Personal history of chemotherapy    current for right breast ca    Past Surgical History:  Procedure Laterality Date  . AXILLARY LYMPH NODE DISSECTION Right 03/19/2018   Procedure: AXILLARY LYMPH NODE DISSECTION;  Surgeon: Robert Bellow, MD;  Location: [facility] ORS;  Service: General;  Laterality: Right;  . BREAST BIOPSY Right 10/11/2017   12:30 posterior coil clip invasive mammary carcinoma  . BREAST BIOPSY Right 10/11/2017   11:30 middle depth ribbon clip DCIS  . BREAST BIOPSY Right 10/11/2017   5:30 anterior depth x shape invasive ductal carcinoma  . BREAST RECONSTRUCTION WITH PLACEMENT OF TISSUE EXPANDER AND FLEX HD (ACELLULAR HYDRATED DERMIS) Right 03/19/2018   Procedure: BREAST RECONSTRUCTION WITH PLACEMENT OF TISSUE EXPANDER AND FLEX HD (ACELLULAR HYDRATED DERMIS);  Surgeon: Wallace Going, DO;  Location: [facility];  Service: Plastics;  Laterality: Right;  . LAPAROSCOPIC BILATERAL SALPINGO OOPHERECTOMY Bilateral 03/19/2018   Procedure: LAPAROSCOPIC BILATERAL SALPINGO OOPHORECTOMY;  Surgeon: Benjaman Kindler, MD;  Location: [facility];  Service: Gynecology;  Laterality: Bilateral;  . MASTECTOMY Right   . MASTECTOMY W/ SENTINEL NODE BIOPSY Right  03/19/2018   Procedure: MASTECTOMY WITH SENTINEL LYMPH NODE BIOPSY;  Surgeon: Robert Bellow, MD;  Location: [facility];  Service: General;  Laterality: Right;  . PORTACATH PLACEMENT Left 10/24/2017   Procedure: INSERTION PORT-A-CATH;  Surgeon: Robert Bellow, MD;  Location: [facility];  Service: General;  Laterality: Left;  . REMOVAL OF TISSUE EXPANDER AND PLACEMENT OF IMPLANT Right 07/20/2018   Procedure: REMOVAL OF RIGHT BREAST TISSUE EXPANDER AND PLACEMENT OF IMPLANT;  Surgeon: Wallace Going, DO;  Location: [place];  Service: Plastics;  Laterality: Right;    There were no vitals filed for this visit.  Subjective Assessment - 08/29/18 1003    Subjective   The neuropathy started just after chemo, and now since about 2 wks ago my hands are swollen - and the gabapentin and lyrica did not work -as well as acupucture -the pain goes into my elbow  on the R - my R is the worse     Patient Stated Goals  I just want to pain better - and swelling     Currently in Pain?  Yes    Pain Score  10-Worst pain ever    Pain Location  Hand    Pain Orientation  Right;Left    Pain Descriptors / Indicators  Burning;Constant;Pins and needles;Shooting;Sharp;Tightness;Throbbing    Pain Onset  More than a month ago    Pain Frequency  Constant          LYMPHEDEMA/ONCOLOGY QUESTIONNAIRE - 08/29/18 1014      Right Upper Extremity Lymphedema   15 cm Proximal to Olecranon  Process  34 cm    10 cm Proximal to Olecranon Process  30.5 cm    Olecranon Process  27.5 cm    15 cm Proximal to Ulnar Styloid Process  26.3 cm    10 cm Proximal to Ulnar Styloid Process  22.5 cm    Just Proximal to Ulnar Styloid Process  17.3 cm    Across Hand at [employer]  20.6 cm    At [place] of 2nd Digit  7 cm    At [facility] of Thumb  7 cm      Left Upper Extremity Lymphedema   15 cm Proximal to Olecranon Process  31.5 cm    10 cm Proximal to Olecranon Process  29.1 cm    Olecranon Process  26.6 cm    15  cm Proximal to Ulnar Styloid Process  26 cm    10 cm Proximal to Ulnar Styloid Process  22 cm    Just Proximal to Ulnar Styloid Process  17 cm    Across Hand at [employer]  20.1 cm    At [place] of 2nd Digit  7 cm    At [facility] of Thumb  7 cm        Pt present at OT screen with severe neuropathy in bilateral hands and feet- but hands worse and R hand worse than L.- Swelling in bilateral hands - unable to make tight or full fist  Pt report pain from 5-15/10 in the R  And going up into elbow and at times worse over thenar eminence- and at night time worse -and L 5-8/10 pain  Neuropathy did not get better with Gabapentin or Lyrica , as well as acupuncture  Pt had last reconstruction surgery on R breast about 5 wks ago  Starting radiation on Monday  Pt has rounded shoulders and tightness in pects End range for shoulder on the R is tight but [facility] Nerve tightness in R UE -   Did assess her circumference in L and R UE- she is R hand dominant - but upper arm is increase by 2.5 cm - will cont to monitor the next month or 2   Pt ed on Homeprogram - and will follow up with her in 2 wks:    Contrast 3-5 x day  Use isotoner gloves to tolerance after contrast  [place]on glides - AROM  8 reps Med N glides  5 reps Ulnar N glide  5 reps  3 x day after contrast  Then pect stretches 10 reps  in doorway AAROM for shoulder flexion and ABD against wall  10 reps  composite nerve glide - out to side  5 reps Scapula squeezes 10 rep- 5 x day                  OT Education - 08/29/18 1008    Education Details  findings and homeprogrem    Person(s) Educated  Patient    Methods  Explanation;Demonstration;Handout    Comprehension  Verbalized understanding;Returned demonstration                   Patient will benefit from skilled therapeutic intervention in order to improve the following deficits and impairments:     Visit Diagnosis: Neuropathy due to chemotherapeutic drug  [facility])    Problem List Patient Active Problem List   Diagnosis Date Noted  . Shortness of breath 08/23/2018  . Cardiomyopathy ([place]) 08/23/2018  . Preprocedural cardiovascular examination 08/23/2018  .  Tachycardia 08/23/2018  . Palpitations 08/23/2018  . Estrogen receptor positive status (ER+) 04/04/2018  . Breast cancer of upper-outer quadrant of right female breast ([place]) 03/19/2018  . Family history of cancer   . Malignant neoplasm of overlapping sites of right breast in female, estrogen receptor positive ([place]) 10/19/2017    Rosalyn Gess OTR/L,CLT 08/29/2018, 10:15 AM  [facility] [address] [place], [place], [place], [address] Phone: [phone]   Fax:  [phone]  Name: Vi Biddinger MRN: [id number] Date of Birth: September 08, 1981

## 2018-08-29 NOTE — Patient Instructions (Signed)
Contrast 3-5 x day  Use isotoner glove to tolerance after contrast  Tedon glides - AROM  8 reps Med N glides  5 reps Ulnar N glide  5 reps  3 x day after contrast  Then pect stretches 10 reps  in doorway AAROM for shoulder flexion and ABD against wall  10 reps  composite nerve glide - out to side  5 reps Scapula squeezes 10 rep- 5 x day

## 2018-08-30 ENCOUNTER — Other Ambulatory Visit: Payer: Self-pay | Admitting: Oncology

## 2018-08-30 ENCOUNTER — Ambulatory Visit
Admission: RE | Admit: 2018-08-30 | Discharge: 2018-08-30 | Disposition: A | Discharge status: Home / Self Care | Payer: BLUE CROSS/BLUE SHIELD | Source: Ambulatory Visit | Attending: Radiation Oncology | Admitting: Radiation Oncology

## 2018-08-30 ENCOUNTER — Other Ambulatory Visit: Payer: Self-pay | Admitting: *Deleted

## 2018-08-30 ENCOUNTER — Telehealth: Payer: Self-pay | Admitting: *Deleted

## 2018-08-30 DIAGNOSIS — C50411 Malignant neoplasm of upper-outer quadrant of right female breast: Secondary | ICD-10-CM | POA: Diagnosis not present

## 2018-08-30 MED ORDER — HYDROCODONE-ACETAMINOPHEN 5-325 MG PO TABS: 1.0000 | ORAL_TABLET | Freq: Four times a day (QID) | ORAL | 0 refills | Status: DC | PRN

## 2018-08-30 NOTE — Progress Notes (Signed)
Patient reports tramadol give her headache and she does not want to take it.  She requests alternatives to alleviate her neuropathy pain. .  Prescribe a trial of Norco.

## 2018-08-30 NOTE — Telephone Encounter (Signed)
Printed Norco and manually faxed [facility]

## 2018-08-30 NOTE — Telephone Encounter (Signed)
Pharmacy does not accept electronic orders . Patient must bring signed script. Refill was for hydrocodone.

## 2018-08-30 NOTE — Telephone Encounter (Signed)
Patients pharmacy, [facility], requires controlled substance rx's to be printed and manual faxed rather than electronic.  Printed Norco, faxed to [facility]

## 2018-09-03 ENCOUNTER — Ambulatory Visit
Admission: RE | Admit: 2018-09-03 | Discharge: 2018-09-03 | Disposition: A | Discharge status: Home / Self Care | Payer: BLUE CROSS/BLUE SHIELD | Source: Ambulatory Visit | Attending: Radiation Oncology | Admitting: Radiation Oncology

## 2018-09-03 DIAGNOSIS — C50411 Malignant neoplasm of upper-outer quadrant of right female breast: Secondary | ICD-10-CM | POA: Diagnosis not present

## 2018-09-04 ENCOUNTER — Ambulatory Visit
Admission: RE | Admit: 2018-09-04 | Discharge: 2018-09-04 | Disposition: A | Discharge status: Home / Self Care | Payer: BLUE CROSS/BLUE SHIELD | Source: Ambulatory Visit | Attending: Radiation Oncology | Admitting: Radiation Oncology

## 2018-09-04 DIAGNOSIS — C50411 Malignant neoplasm of upper-outer quadrant of right female breast: Secondary | ICD-10-CM | POA: Diagnosis not present

## 2018-09-05 ENCOUNTER — Ambulatory Visit
Admission: RE | Admit: 2018-09-05 | Discharge: 2018-09-05 | Disposition: A | Discharge status: Home / Self Care | Payer: BLUE CROSS/BLUE SHIELD | Source: Ambulatory Visit | Attending: Radiation Oncology | Admitting: Radiation Oncology

## 2018-09-05 DIAGNOSIS — C50411 Malignant neoplasm of upper-outer quadrant of right female breast: Secondary | ICD-10-CM | POA: Diagnosis not present

## 2018-09-06 ENCOUNTER — Ambulatory Visit
Admission: RE | Admit: 2018-09-06 | Discharge: 2018-09-06 | Disposition: A | Discharge status: Home / Self Care | Payer: BLUE CROSS/BLUE SHIELD | Source: Ambulatory Visit | Attending: Radiation Oncology | Admitting: Radiation Oncology

## 2018-09-06 DIAGNOSIS — C50411 Malignant neoplasm of upper-outer quadrant of right female breast: Secondary | ICD-10-CM | POA: Diagnosis not present

## 2018-09-07 ENCOUNTER — Ambulatory Visit
Admission: RE | Admit: 2018-09-07 | Discharge: 2018-09-07 | Disposition: A | Discharge status: Home / Self Care | Payer: BLUE CROSS/BLUE SHIELD | Source: Ambulatory Visit | Attending: Radiation Oncology | Admitting: Radiation Oncology

## 2018-09-07 DIAGNOSIS — C50411 Malignant neoplasm of upper-outer quadrant of right female breast: Secondary | ICD-10-CM | POA: Diagnosis not present

## 2018-09-10 ENCOUNTER — Ambulatory Visit
Admission: RE | Admit: 2018-09-10 | Discharge: 2018-09-10 | Disposition: A | Discharge status: Home / Self Care | Payer: BLUE CROSS/BLUE SHIELD | Source: Ambulatory Visit | Attending: Radiation Oncology | Admitting: Radiation Oncology

## 2018-09-10 ENCOUNTER — Ambulatory Visit: Payer: BLUE CROSS/BLUE SHIELD | Admitting: Oncology

## 2018-09-10 ENCOUNTER — Other Ambulatory Visit: Payer: BLUE CROSS/BLUE SHIELD

## 2018-09-10 DIAGNOSIS — C50411 Malignant neoplasm of upper-outer quadrant of right female breast: Secondary | ICD-10-CM | POA: Diagnosis not present

## 2018-09-11 ENCOUNTER — Ambulatory Visit
Admission: RE | Admit: 2018-09-11 | Discharge: 2018-09-11 | Disposition: A | Discharge status: Home / Self Care | Payer: BLUE CROSS/BLUE SHIELD | Source: Ambulatory Visit | Attending: Radiation Oncology | Admitting: Radiation Oncology

## 2018-09-11 DIAGNOSIS — Z510 Encounter for antineoplastic radiation therapy: Secondary | ICD-10-CM | POA: Diagnosis not present

## 2018-09-11 DIAGNOSIS — Z170 Estrogen receptor positive status [ER+]: Secondary | ICD-10-CM | POA: Diagnosis not present

## 2018-09-11 DIAGNOSIS — C50411 Malignant neoplasm of upper-outer quadrant of right female breast: Secondary | ICD-10-CM | POA: Diagnosis not present

## 2018-09-12 ENCOUNTER — Encounter: Payer: Self-pay | Admitting: Oncology

## 2018-09-12 ENCOUNTER — Ambulatory Visit
Admission: RE | Admit: 2018-09-12 | Discharge: 2018-09-12 | Disposition: A | Discharge status: Home / Self Care | Payer: BLUE CROSS/BLUE SHIELD | Source: Ambulatory Visit | Attending: Oncology | Admitting: Oncology

## 2018-09-12 ENCOUNTER — Inpatient Hospital Stay (HOSPITAL_BASED_OUTPATIENT_CLINIC_OR_DEPARTMENT_OTHER): Payer: BLUE CROSS/BLUE SHIELD | Admitting: Oncology

## 2018-09-12 ENCOUNTER — Ambulatory Visit
Admission: RE | Admit: 2018-09-12 | Discharge: 2018-09-12 | Disposition: A | Discharge status: Home / Self Care | Payer: BLUE CROSS/BLUE SHIELD | Source: Ambulatory Visit | Attending: Radiation Oncology | Admitting: Radiation Oncology

## 2018-09-12 ENCOUNTER — Other Ambulatory Visit: Payer: Self-pay

## 2018-09-12 ENCOUNTER — Inpatient Hospital Stay: Payer: BLUE CROSS/BLUE SHIELD | Admitting: Occupational Therapy

## 2018-09-12 ENCOUNTER — Other Ambulatory Visit: Payer: Self-pay | Admitting: Oncology

## 2018-09-12 ENCOUNTER — Inpatient Hospital Stay: Payer: BLUE CROSS/BLUE SHIELD | Attending: Oncology

## 2018-09-12 VITALS — BP 118/71 | HR 73 | Temp 96.3°F | Resp 73 | Wt 181.7 lb

## 2018-09-12 DIAGNOSIS — Z808 Family history of malignant neoplasm of other organs or systems: Secondary | ICD-10-CM | POA: Insufficient documentation

## 2018-09-12 DIAGNOSIS — Z87891 Personal history of nicotine dependence: Secondary | ICD-10-CM

## 2018-09-12 DIAGNOSIS — M7989 Other specified soft tissue disorders: Secondary | ICD-10-CM

## 2018-09-12 DIAGNOSIS — Z170 Estrogen receptor positive status [ER+]: Secondary | ICD-10-CM | POA: Diagnosis not present

## 2018-09-12 DIAGNOSIS — C50811 Malignant neoplasm of overlapping sites of right female breast: Secondary | ICD-10-CM | POA: Diagnosis not present

## 2018-09-12 DIAGNOSIS — Z905 Acquired absence of kidney: Secondary | ICD-10-CM | POA: Diagnosis not present

## 2018-09-12 DIAGNOSIS — Z79899 Other long term (current) drug therapy: Secondary | ICD-10-CM

## 2018-09-12 DIAGNOSIS — I509 Heart failure, unspecified: Secondary | ICD-10-CM | POA: Insufficient documentation

## 2018-09-12 DIAGNOSIS — Z9011 Acquired absence of right breast and nipple: Secondary | ICD-10-CM | POA: Insufficient documentation

## 2018-09-12 DIAGNOSIS — G620 Drug-induced polyneuropathy: Secondary | ICD-10-CM | POA: Diagnosis not present

## 2018-09-12 DIAGNOSIS — I5021 Acute systolic (congestive) heart failure: Secondary | ICD-10-CM | POA: Insufficient documentation

## 2018-09-12 DIAGNOSIS — Z8049 Family history of malignant neoplasm of other genital organs: Secondary | ICD-10-CM | POA: Diagnosis not present

## 2018-09-12 DIAGNOSIS — R221 Localized swelling, mass and lump, neck: Secondary | ICD-10-CM | POA: Insufficient documentation

## 2018-09-12 DIAGNOSIS — Z8052 Family history of malignant neoplasm of bladder: Secondary | ICD-10-CM | POA: Diagnosis not present

## 2018-09-12 DIAGNOSIS — T451X5A Adverse effect of antineoplastic and immunosuppressive drugs, initial encounter: Secondary | ICD-10-CM | POA: Insufficient documentation

## 2018-09-12 DIAGNOSIS — Z79811 Long term (current) use of aromatase inhibitors: Secondary | ICD-10-CM | POA: Insufficient documentation

## 2018-09-12 DIAGNOSIS — I429 Cardiomyopathy, unspecified: Secondary | ICD-10-CM

## 2018-09-12 DIAGNOSIS — C50411 Malignant neoplasm of upper-outer quadrant of right female breast: Secondary | ICD-10-CM | POA: Diagnosis not present

## 2018-09-12 LAB — COMPREHENSIVE METABOLIC PANEL
ALBUMIN: 4.3 g/dL (ref 3.5–5.0)
ALT: 18 U/L (ref 0–44)
AST: 23 U/L (ref 15–41)
Alkaline Phosphatase: 65 U/L (ref 38–126)
Anion gap: 9 (ref 5–15)
BUN: 16 mg/dL (ref 6–20)
CHLORIDE: 103 mmol/L (ref 98–111)
CO2: 28 mmol/L (ref 22–32)
Calcium: 9.8 mg/dL (ref 8.9–10.3)
Creatinine, Ser: 0.49 mg/dL (ref 0.44–1.00)
GFR calc Af Amer: >60 mL/min (ref >60)
GFR calc non Af Amer: >60 mL/min (ref >60)
GLUCOSE: 109 mg/dL — AB (ref 70–99)
POTASSIUM: 4 mmol/L (ref 3.5–5.1)
Sodium: 140 mmol/L (ref 135–145)
TOTAL PROTEIN: 7.5 g/dL (ref 6.5–8.1)
Total Bilirubin: 0.3 mg/dL (ref 0.3–1.2)

## 2018-09-12 LAB — CBC WITH DIFFERENTIAL/PLATELET
BASOS ABS: 0 10*3/uL (ref 0–0.1)
BASOS PCT: 1 %
EOS ABS: 0.1 10*3/uL (ref 0–0.7)
Eosinophils Relative: 2 %
HEMATOCRIT: 36.8 % (ref 35.0–47.0)
HEMOGLOBIN: 12.7 g/dL (ref 12.0–16.0)
Lymphocytes Relative: 25 %
Lymphs Abs: 1.4 10*3/uL (ref 1.0–3.6)
MCH: 30.1 pg (ref 26.0–34.0)
MCHC: 34.5 g/dL (ref 32.0–36.0)
MCV: 87.2 fL (ref 80.0–100.0)
Monocytes Absolute: 0.6 10*3/uL (ref 0.2–0.9)
Monocytes Relative: 11 %
NEUTROS ABS: 3.4 10*3/uL (ref 1.4–6.5)
NEUTROS PCT: 61 %
Platelets: 284 10*3/uL (ref 150–440)
RBC: 4.23 MIL/uL (ref 3.80–5.20)
RDW: 12.6 % (ref 11.5–14.5)
WBC: 5.5 10*3/uL (ref 3.6–11.0)

## 2018-09-12 IMAGING — US US EXTREM  UP VENOUS BILAT
1 series · 13 of 24 positions shown · non-contrast
Comparison: None.

CLINICAL DATA: 37-year-old female with a history of bilateral hand
swelling

EXAM:
BILATERAL UPPER EXTREMITY VENOUS DOPPLER ULTRASOUND
TECHNIQUE: Gray-scale sonography with graded compression, as well as color
Doppler and duplex ultrasound were performed to evaluate the
bilateral upper extremity deep venous systems from the level of the
subclavian vein and including the jugular, axillary, basilic,
radial, ulnar and upper cephalic vein. Spectral Doppler was utilized
to evaluate flow at rest and with distal augmentation maneuvers.

[Series 1: us extrem up venous bilat · 13 of 61 slices shown]
[im 1/61]
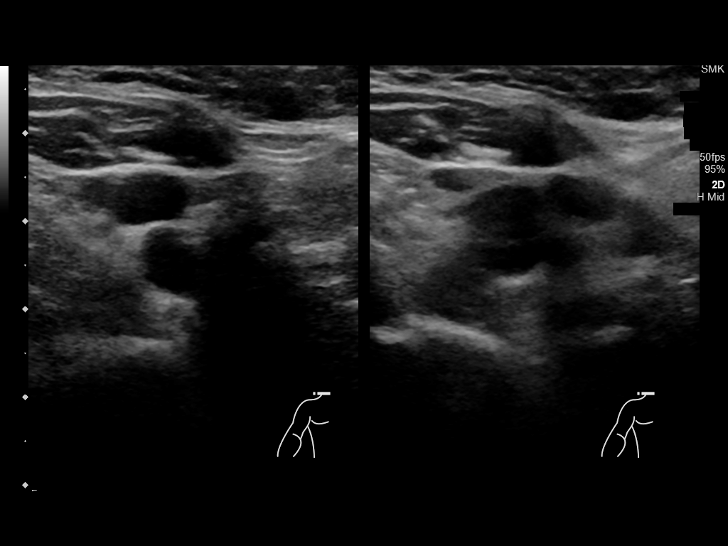
[im 6/61]
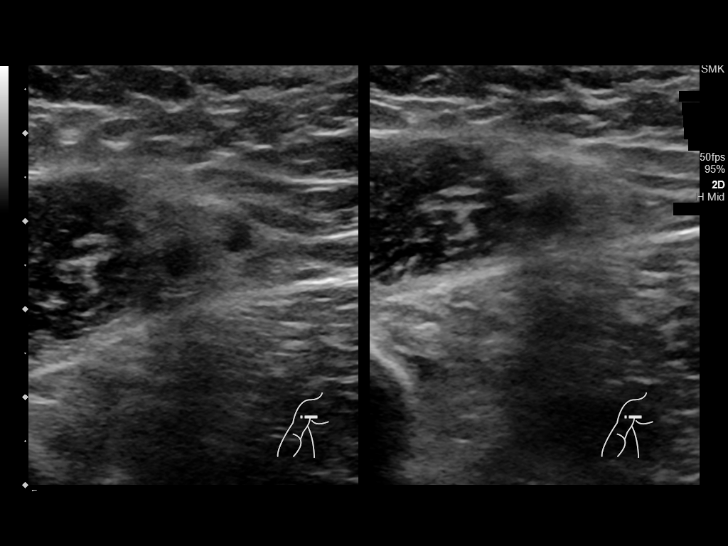
[im 11/61]
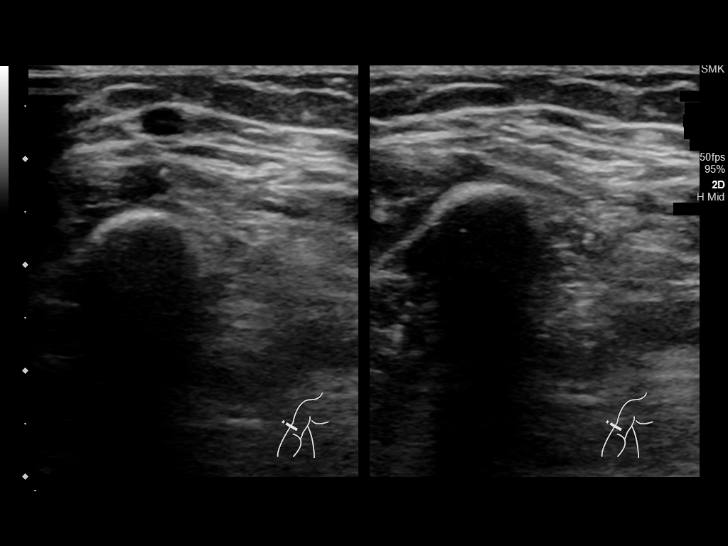
[im 16/61]
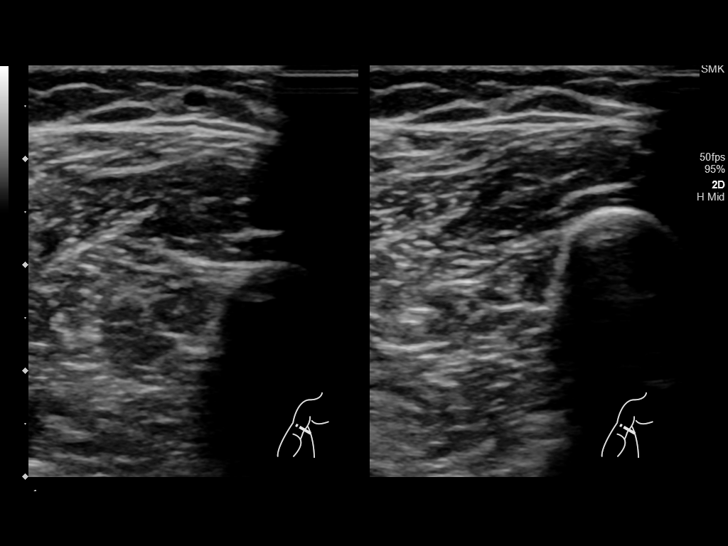
[im 21/61]
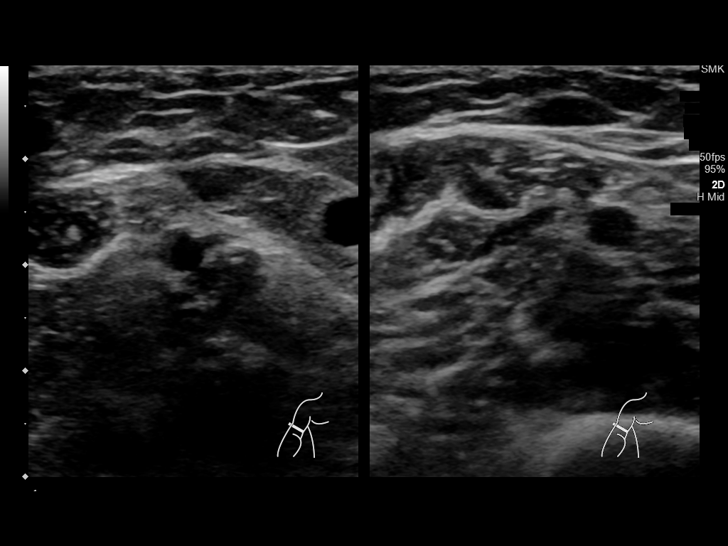
[im 27/61]
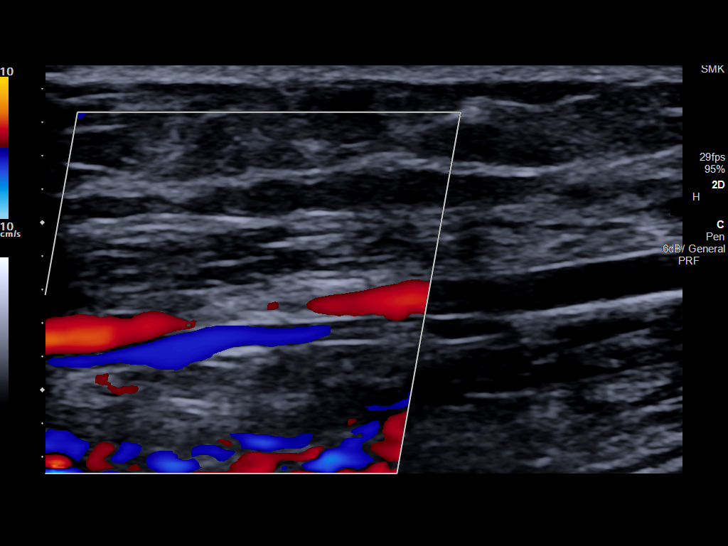
[im 32/61]
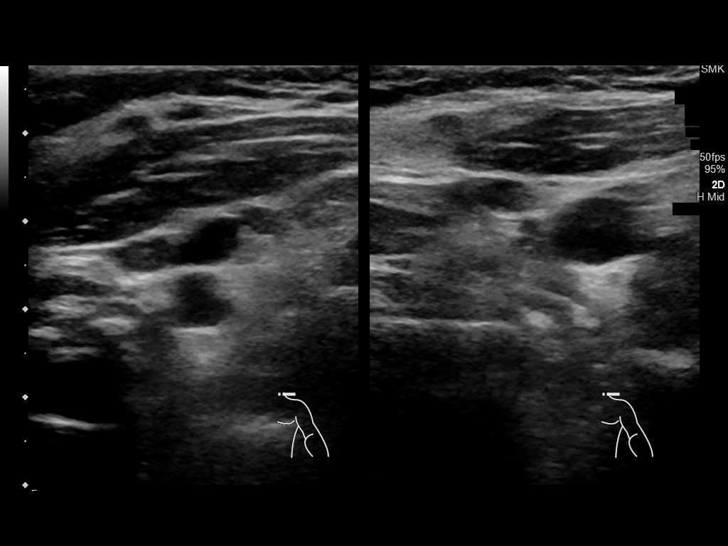
[im 34/61]
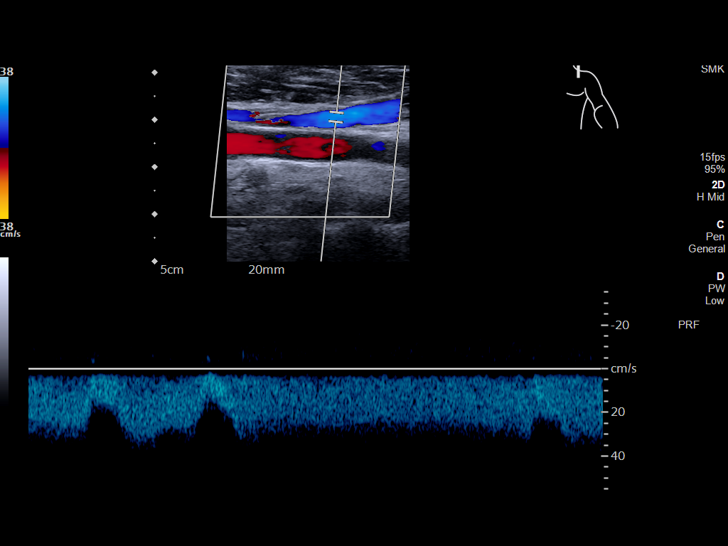
[im 40/61]
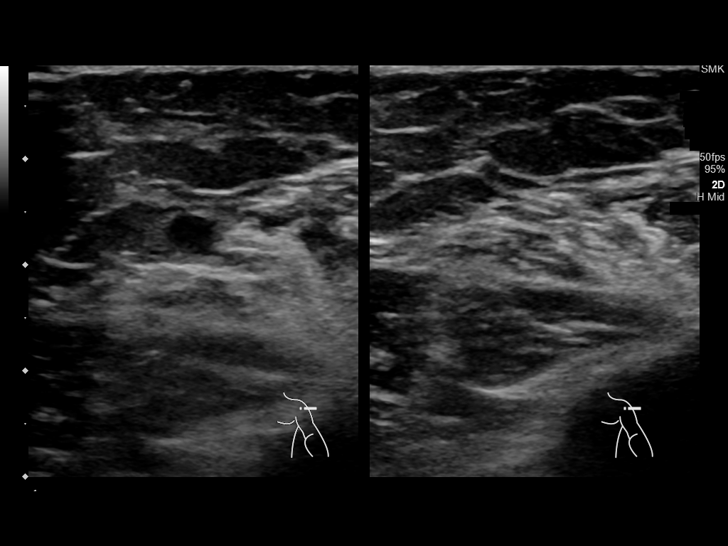
[im 45/61]
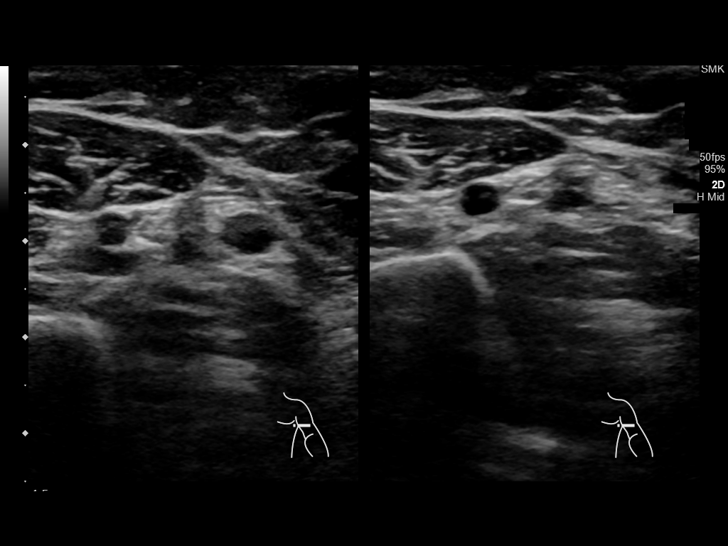
[im 50/61]
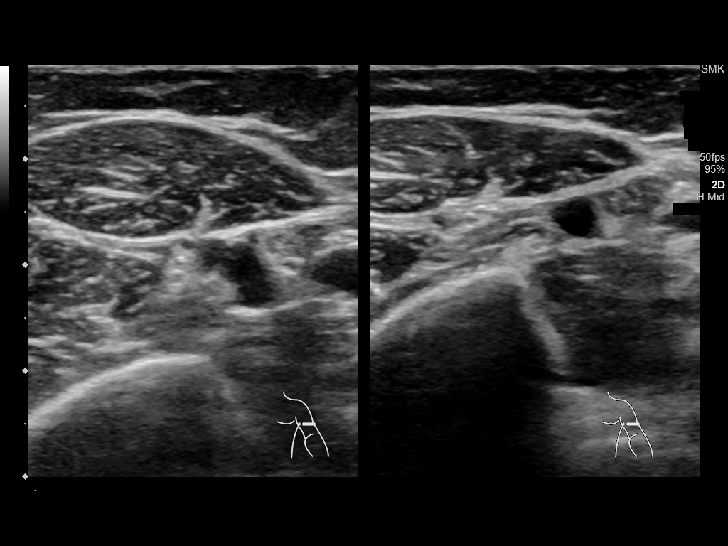
[im 55/61]
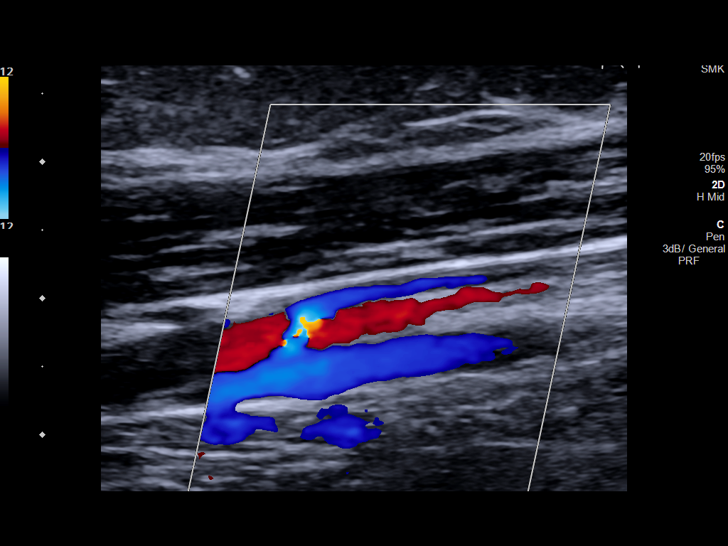
[im 61/61]
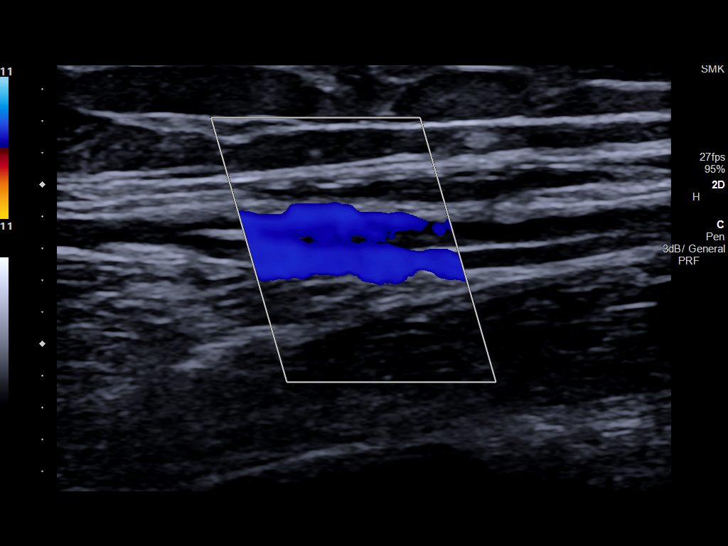

[13 of 24 positions shown; findings below may reference images not displayed]

FINDINGS: RIGHT UPPER EXTREMITY

Internal Jugular Vein: No evidence of thrombus. Normal
compressibility, respiratory phasicity and response to augmentation.

Subclavian Vein: No evidence of thrombus. Normal compressibility,
respiratory phasicity and response to augmentation.

Axillary Vein: No evidence of thrombus. Normal compressibility,
respiratory phasicity and response to augmentation.

Cephalic Vein: No evidence of thrombus. Normal compressibility,
respiratory phasicity and response to augmentation.

Basilic Vein: No evidence of thrombus. Normal compressibility,
respiratory phasicity and response to augmentation.

Brachial Veins: No evidence of thrombus. Normal compressibility,
respiratory phasicity and response to augmentation.

Radial Veins: No evidence of thrombus. Normal compressibility,
respiratory phasicity and response to augmentation.

Ulnar Veins: No evidence of thrombus. Normal compressibility,
respiratory phasicity and response to augmentation.

Other Findings:  None.

LEFT UPPER EXTREMITY

Internal Jugular Vein: No evidence of thrombus. Normal
compressibility, respiratory phasicity and response to augmentation.

Subclavian Vein: No evidence of thrombus. Normal compressibility,
respiratory phasicity and response to augmentation.

Axillary Vein: No evidence of thrombus. Normal compressibility,
respiratory phasicity and response to augmentation.

Cephalic Vein: No evidence of thrombus. Normal compressibility,
respiratory phasicity and response to augmentation.

Basilic Vein: No evidence of thrombus. Normal compressibility,
respiratory phasicity and response to augmentation.

Brachial Veins: No evidence of thrombus. Normal compressibility,
respiratory phasicity and response to augmentation.

Radial Veins: No evidence of thrombus. Normal compressibility,
respiratory phasicity and response to augmentation.

Ulnar Veins: No evidence of thrombus. Normal compressibility,
respiratory phasicity and response to augmentation.

Other Findings:  None.
IMPRESSION: Sonographic survey of the bilateral upper extremities negative for
DVT.

## 2018-09-12 NOTE — Progress Notes (Signed)
Patient here for follow up. States she get nauseated at times, unsure if it is related to radiation. Pain is still the same, has not gotten better. Its now constant in her shoulder.

## 2018-09-12 NOTE — Progress Notes (Signed)
Hematology/Oncology Follow Up Note [facility] [phone] [phone]  Patient Care Team: Juluis Pitch, MD as PCP - General (Family Medicine)   Name of the patient: Theresa Chaney  [id number]  01-Nov-1981   Date of visit: 09/12/18 REASON FOR VISIT Follow up for Assessment prior to chemotherapy treatment of breast cancer  Oncology History 10/11/2017 Diagnosed with cT30mcN0  Neoadjuvant dose dense AC and 1 cycle of Taxol.  Interim image showed no treatment response. 03/19/2018 right mastectomy and right axillary dissection, bilateral salpingo-oophorectomy. ypT3 ypN2 #Negative genetic testing #06/11/2018 Finish adjuvant weekly Taxol x11 Grade 2 neuropathy  #right immediate breast reconstruction with placement of tissue expanders.  INTERVAL HISTORY Patient presents for follow-up of breast cancer management.  Patient has completed right breast reconstruction on 07/20/2018 and is going to start adjuvant radiation next week. #Continue to have persistent bilateral hand numbness tingling/burning pain and swelling: Reports that Norco does not alleviate the pain.  She has been taking Lyrica 300 mg daily.  Symptoms continue to be uncontrolled. She has mild neuropathy of her bilateral foot/toes.  #Bilateral upper extremity swelling, ongoing problem.  No aggravating factors or alleviating factors.  She has establish care with lymphedema clinic. #CHF: due to the extremity swelling, LVEF to 45%.    She has been started on low-dose metoprolol.  Follows up with cardiology. #Bone pain, reports generalized bone pain, not relieved by taking tylenol.  Also with hot flushes fatigue.    Cancer TREATMENT Neoadjuvant ddAC +1 dose of Taxol, due to lack of response, surgery was offered. Case was discussed on breast tumor board. 03/19/2018 S/p right mastectomy and right axillary dissection, immediate breast reconstruction with placement of expanders. Also had elective  bilateral salpingo-oophorectomy..  # s/p 11 cycles Taxol adjuvantly. Tolerated well.  # .# She has obtained dental clearance for starting Zometa.  S/p Zometa on 6/3/ 2019   Review of Systems  Constitutional: Positive for malaise/fatigue. Negative for chills, diaphoresis, fever and weight loss.  HENT: Negative for congestion, ear discharge, ear pain, hearing loss, nosebleeds, sinus pain, sore throat and tinnitus.   Eyes: Negative for blurred vision, double vision, photophobia, pain, discharge and redness.  Respiratory: Negative for cough, hemoptysis, sputum production, shortness of breath and wheezing.   Cardiovascular: Negative for chest pain, palpitations, orthopnea, claudication and leg swelling.  Gastrointestinal: Negative for abdominal pain, blood in stool, constipation, diarrhea, heartburn, melena, nausea and vomiting.  Genitourinary: Negative for dysuria, flank pain, frequency, hematuria and urgency.  Musculoskeletal: Negative for back pain, myalgias and neck pain.       Hand swelling  Skin: Negative for itching and rash.  Neurological: Positive for tingling. Negative for dizziness, tremors, speech change, focal weakness, weakness and headaches.       Worsening tingling numbness burning sensation of bilateral hands and toes.  Not relieved with taking either gabapentin or Lyrica.  Endo/Heme/Allergies: Negative for environmental allergies and polydipsia. Does not bruise/bleed easily.  Psychiatric/Behavioral: Negative for depression, hallucinations and substance abuse. The patient is not nervous/anxious.     No Known Allergies  Patient Active Problem List   Diagnosis Date Noted  . Shortness of breath 08/23/2018  . Cardiomyopathy ([place] 08/23/2018  . Preprocedural cardiovascular examination 08/23/2018  . Tachycardia 08/23/2018  . Palpitations 08/23/2018  . Estrogen receptor positive status (ER+) 04/04/2018  . Breast cancer of upper-outer quadrant of right female breast [place]  03/19/2018  . Family history of cancer   . Malignant neoplasm of overlapping sites of right breast in  female, estrogen receptor positive ([place]) 10/19/2017     Past Medical History:  Diagnosis Date  . Anemia   . BRCA negative 11/26/2017  . Breast cancer ([place]) 10/11/2017   Multifocal, ER positive, PR negative, HER-2 negative.  . Chronic bronchitis ([place]) 11/2017  . COPD (chronic obstructive pulmonary disease) ([place])    MILD PER CXR  . Depression   . Family history of cancer   . GERD (gastroesophageal reflux disease)   . Headache    MIGRAINES  . Heart murmur    ASYMPTOMATIC  . Personal history of chemotherapy    current for right breast ca     Past Surgical History:  Procedure Laterality Date  . AXILLARY LYMPH NODE DISSECTION Right 03/19/2018   Procedure: AXILLARY LYMPH NODE DISSECTION;  Surgeon: Robert Bellow, MD;  Location: [facility] ORS;  Service: General;  Laterality: Right;  . BREAST BIOPSY Right 10/11/2017   12:30 posterior coil clip invasive mammary carcinoma  . BREAST BIOPSY Right 10/11/2017   11:30 middle depth ribbon clip DCIS  . BREAST BIOPSY Right 10/11/2017   5:30 anterior depth x shape invasive ductal carcinoma  . BREAST RECONSTRUCTION WITH PLACEMENT OF TISSUE EXPANDER AND FLEX HD (ACELLULAR HYDRATED DERMIS) Right 03/19/2018   Procedure: BREAST RECONSTRUCTION WITH PLACEMENT OF TISSUE EXPANDER AND FLEX HD (ACELLULAR HYDRATED DERMIS);  Surgeon: Wallace Going, DO;  Location: [facility] ORS;  Service: Plastics;  Laterality: Right;  . LAPAROSCOPIC BILATERAL SALPINGO OOPHERECTOMY Bilateral 03/19/2018   Procedure: LAPAROSCOPIC BILATERAL SALPINGO OOPHORECTOMY;  Surgeon: Benjaman Kindler, MD;  Location: [facility];  Service: Gynecology;  Laterality: Bilateral;  . MASTECTOMY Right   . MASTECTOMY W/ SENTINEL NODE BIOPSY Right 03/19/2018   Procedure: MASTECTOMY WITH SENTINEL LYMPH NODE BIOPSY;  Surgeon: Robert Bellow, MD;  Location: [facility];  Service: General;  Laterality: Right;    . PORTACATH PLACEMENT Left 10/24/2017   Procedure: INSERTION PORT-A-CATH;  Surgeon: Robert Bellow, MD;  Location: [facility];  Service: General;  Laterality: Left;  . REMOVAL OF TISSUE EXPANDER AND PLACEMENT OF IMPLANT Right 07/20/2018   Procedure: REMOVAL OF RIGHT BREAST TISSUE EXPANDER AND PLACEMENT OF IMPLANT;  Surgeon: Wallace Going, DO;  Location: [place];  Service: Plastics;  Laterality: Right;    Social History   Socioeconomic History  . Marital status: Married    Spouse name: Not on file  . Number of children: Not on file  . Years of education: Not on file  . Highest education level: Not on file  Occupational History  . Occupation: Occupational psychologist    Comment: [employer]  . Financial resource strain: Not on file  . Food insecurity:    Worry: Not on file    Inability: Not on file  . Transportation needs:    Medical: Not on file    Non-medical: Not on file  Tobacco Use  . Smoking status: Former Smoker    Packs/day: 0.50    Years: 18.00    Pack years: 9.00    Types: Cigarettes    Start date: 06/21/2018  . Smokeless tobacco: Never Used  Substance and Sexual Activity  . Alcohol use: No    Frequency: Never  . Drug use: No  . Sexual activity: Yes    Birth control/protection: Injection  Lifestyle  . Physical activity:    Days per week: Not on file    Minutes per session: Not on file  . Stress: Not on file  Relationships  .  Social connections:    Talks on phone: Not on file    Gets together: Not on file    Attends religious service: Not on file    Active member of club or organization: Not on file    Attends meetings of clubs or organizations: Not on file    Relationship status: Not on file  . Intimate partner violence:    Fear of current or ex partner: Not on file    Emotionally abused: Not on file    Physically abused: Not on file    Forced sexual activity: Not on file  Other Topics Concern   . Not on file  Social History Narrative  . Not on file     Family History  Problem Relation Age of Onset  . Melanoma Maternal Aunt        other aunts with BCC/SCC/Melanoma  . Diabetes Father   . Hypertension Father   . Hyperlipidemia Father   . Heart attack Father 44       "mild"  . Bladder Cancer Maternal Grandmother   . Cervical cancer Maternal Aunt 64       daughter w/ cervical cancer as well  . Melanoma Maternal Uncle        other uncles with BCC/SCC/Melanoma     Current Outpatient Medications:  .  albuterol (PROVENTIL HFA;VENTOLIN HFA) 108 (90 Base) MCG/ACT inhaler, Inhale 2 puffs into the lungs every 6 (six) hours as needed for wheezing or shortness of breath (Cough)., Disp: 1 Inhaler, Rfl: 2 .  ALPRAZolam (XANAX) 0.25 MG tablet, Take 0.25 mg at bedtime as needed by mouth for anxiety or sleep. , Disp: , Rfl:  .  Calcium Carb-Cholecalciferol (CALCIUM 600 + D PO), Take 1 tablet daily by mouth., Disp: , Rfl:  .  chlorhexidine (PERIDEX) 0.12 % solution, USE AS DIRECTED 15MLS IN THE MOUTH OR THROAT TWICE A DAY, Disp: 473 mL, Rfl: 1 .  doxylamine, Sleep, (UNISOM) 25 MG tablet, Take 25 mg by mouth at bedtime as needed for sleep., Disp: , Rfl:  .  escitalopram (LEXAPRO) 20 MG tablet, Take 20 mg by mouth every morning. , Disp: , Rfl:  .  esomeprazole (NEXIUM) 40 MG capsule, Take 40 mg by mouth every morning. , Disp: , Rfl:  .  ibuprofen (ADVIL,MOTRIN) 800 MG tablet, Take 1 tablet (800 mg total) by mouth every 8 (eight) hours as needed for moderate pain., Disp: 30 tablet, Rfl: 1 .  letrozole (FEMARA) 2.5 MG tablet, Take 1 tablet (2.5 mg total) by mouth daily., Disp: 90 tablet, Rfl: 0 .  loratadine (CLARITIN) 10 MG tablet, Take 10 mg by mouth daily. , Disp: , Rfl:  .  LORazepam (ATIVAN) 1 MG tablet, Take 1 mg by mouth every 8 (eight) hours., Disp: , Rfl:  .  metoprolol succinate (TOPROL XL) 25 MG 24 hr tablet, Take 0.5 tablets (12.5 mg total) by mouth daily., Disp: 45 tablet, Rfl: 3 .   pregabalin (LYRICA) 300 MG capsule, Take 1 capsule (300 mg total) by mouth daily., Disp: 30 capsule, Rfl: 0 .  prochlorperazine (COMPAZINE) 10 MG tablet, Take 1 tablet (10 mg total) by mouth every 6 (six) hours as needed (Nausea or vomiting)., Disp: 30 tablet, Rfl: 1 .  promethazine (PHENERGAN) 25 MG tablet, Take 1 tablet (25 mg total) by mouth every 6 (six) hours as needed for nausea or vomiting., Disp: 30 tablet, Rfl: 0 .  pyridOXINE (VITAMIN B-6) 100 MG tablet, Take 1 tablet (100 mg total) by  mouth daily., Disp: 30 tablet, Rfl: 1 .  vitamin C (ASCORBIC ACID) 500 MG tablet, Take 500 mg by mouth daily., Disp: , Rfl:  .  HYDROcodone-acetaminophen (NORCO) 5-325 MG tablet, Take 1 tablet by mouth every 6 (six) hours as needed for moderate pain. (Patient not taking: Reported on 09/12/2018), Disp: 20 tablet, Rfl: 0   Physical exam:  Vitals:   09/12/18 0907  BP: 118/71  Pulse: 73  Resp: (!) 73  Temp: (!) 96.3 F (35.7 C)  Weight: 181 lb 11.2 oz (82.4 kg)  ECOG 1 Physical Exam  Constitutional: She is oriented to person, place, and time. No distress.  HENT:  Head: Normocephalic and atraumatic.  Nose: Nose normal.  Mouth/Throat: Oropharynx is clear and moist. No oropharyngeal exudate.  No thrush,  Eyes: Pupils are equal, round, and reactive to light. Conjunctivae and EOM are normal. Left eye exhibits no discharge. No scleral icterus.  Neck: Normal range of motion. Neck supple. No JVD present.  Cardiovascular: Normal rate, regular rhythm and normal heart sounds.  No murmur heard. Pulmonary/Chest: Effort normal and breath sounds normal. No respiratory distress. She has no wheezes. She has no rales. She exhibits no tenderness.  Abdominal: Soft. Bowel sounds are normal. She exhibits no distension and no mass. There is no tenderness. There is no rebound.  Musculoskeletal: Normal range of motion. She exhibits no edema.  Bilateral hand swelling.   Lymphadenopathy:    She has no cervical adenopathy.   Neurological: She is alert and oriented to person, place, and time. No cranial nerve deficit. She exhibits normal muscle tone. Coordination normal.  Skin: Skin is warm and dry. No rash noted. She is not diaphoretic. No erythema.  Psychiatric: Affect normal.       CMP Latest Ref Rng & Units 09/12/2018  Glucose 70 - 99 mg/dL 109(H)  BUN 6 - 20 mg/dL 16  Creatinine 0.44 - 1.00 mg/dL 0.49  Sodium 135 - 145 mmol/L 140  Potassium 3.5 - 5.1 mmol/L 4.0  Chloride 98 - 111 mmol/L 103  CO2 22 - 32 mmol/L 28  Calcium 8.9 - 10.3 mg/dL 9.8  Total Protein 6.5 - 8.1 g/dL 7.5  Total Bilirubin 0.3 - 1.2 mg/dL 0.3  Alkaline Phos 38 - 126 U/L 65  AST 15 - 41 U/L 23  ALT 0 - 44 U/L 18   CBC Latest Ref Rng & Units 09/12/2018  WBC 3.6 - 11.0 K/uL 5.5  Hemoglobin 12.0 - 16.0 g/dL 12.7  Hematocrit 35.0 - 47.0 % 36.8  Platelets 150 - 440 K/uL 284   RADIOGRAPHIC STUDIES: I have personally reviewed the radiological images as listed and agreed with the findings in the report. 08/23/2018 DEXA The BMD measured at Femur Neck Right is 0.900 g/cm2 with a T-score of -1.0. This patient is considered normal according to [facility]) criteria. Site Region Measured Measured WHO Young Adult BMD Date       Age      Classification T-score AP Spine L1-L4 08/23/2018 35.5 Normal -0.8 1.091 g/cm2 DualFemur Neck Right 08/23/2018 35.5 Normal -1.0 0.900 g/cm2  08/15/2018 2D echo Study Conclusions - Left ventricle: The cavity size was mildly dilated. Systolic   function was mildly reduced. The estimated ejection fraction was   45%. Diffuse hypokinesis. Hypokinesis of the anteroseptal   myocardium. Doppler parameters are consistent with abnormal left   ventricular relaxation (grade 1 diastolic dysfunction). - Aortic valve: Valve area (VTI): 1.88 cm^2. Valve area (Vmax): 1.8   cm^2. Valve  area (Vmean): 1.74 cm^2. - Mitral valve: There was mild regurgitation. Valve area by   continuity equation  (using LVOT flow): 2.22 cm^2. - Left atrium: The atrium was mildly dilated. - Right ventricle: The cavity size was moderately dilated. - Right atrium: The atrium was mildly dilated.   Assessment and plan- Patient is a 37 y.o. female presents for evaluation of newly diagnosed multicentric right breast cancer.  Cancer Staging Breast cancer of upper-outer quadrant of right female breast [facility]) Staging form: Breast, AJCC 8th Edition - Clinical: G2, ER+, PR+, HER2- - Signed by Earlie Server, MD on 04/05/2018 - Pathologic stage from 04/04/2018: No Stage Recommended (ypT3, pN2, cM0, G3, ER+, PR-, HER2-) - Signed by Earlie Server, MD on 04/04/2018 - Pathologic: No stage assigned - Unsigned     1. Bilateral hand swelling   2. Neck swelling   3. Neuropathy due to chemotherapeutic drug ([place])   4. Malignant neoplasm of overlapping sites of right breast in female, estrogen receptor positive ([place])   5. Aromatase inhibitor use   6. Acute systolic congestive heart failure ([place])     #Breast cancer,  Labs reviewed and discussed with patient. Counts are stable. Anemia completely resolved.  Status post bilateral nephrectomy.  Continue letrozole 2.5 mg daily.  # Worsening neuropathy, ? Chemotherapy induced.  Continue Lyrica 300 mg daily.  Added Norco every 6 hours as needed for pain control.  # Hand swelling, bilaterally.  She has establish care with lymphedema evaluation However bilateral upper extremity/hand swelling after only right side x-ray lymph node dissection. Will obtain bilateral upper extremity ultrasound to rule out DVT  #Neck puffy/swelling, obtain CT images for follow-up.  Rule out SVC syndrome.  #Status post nephrectomy, on aromatase inhibitor continue calcium and vitamin D supplements.  Plan adjuvant bisphosphonate every 6 months for 2 years. Next dose due in December  #Follow-up with radiation oncology to start radiation.  She has started on radiation.  # CHF: LVEF has decreased to 45% She has  been evaluated by cardiology and will have additional work up done. On beta blocker  # Medi port flush every 8 weeks, will schedule for the next 6 months.  #Not eligible for NATALEE clinical trial due to ? CHF. Awaiting cardiology work up. .   Follow up in 2 weeks for assessement .  Orders Placed This Encounter  Procedures  . CT Chest W Contrast    Standing Status:   Future    Standing Expiration Date:   09/12/2019    Order Specific Question:   ** REASON FOR EXAM (FREE TEXT)    Answer:   bilaterally upper extremity swelling, neck puffy.    Order Specific Question:   If indicated for the ordered procedure, I authorize the administration of contrast media per Radiology protocol    Answer:   Yes    Order Specific Question:   Is patient pregnant?    Answer:   No    Order Specific Question:   Preferred imaging location?    Answer:   [facility]    Order Specific Question:   Radiology Contrast Protocol - do NOT remove file path    Answer:   \\[email or web address]  . CT Abdomen Pelvis W Contrast    Standing Status:   Future    Standing Expiration Date:   09/12/2019    Order Specific Question:   ** REASON FOR EXAM (FREE TEXT)    Answer:   swelling. SVC syndrome? histroy of breast cancer  Order Specific Question:   If indicated for the ordered procedure, I authorize the administration of contrast media per Radiology protocol    Answer:   Yes    Order Specific Question:   Is patient pregnant?    Answer:   No    Order Specific Question:   Preferred imaging location?    Answer:   [facility]    Order Specific Question:   Is Oral Contrast requested for this exam?    Answer:   Yes, Per Radiology protocol    Order Specific Question:   Radiology Contrast Protocol - do NOT remove file path    Answer:   \\[email or web address]  . US Venous Img Upper Bilat    Standing Status:   Future    Number of Occurrences:   1    Standing Expiration Date:    09/12/2019    Order Specific Question:   Reason for Exam (SYMPTOM  OR DIAGNOSIS REQUIRED)    Answer:   Swelling    Order Specific Question:   Preferred imaging location?    Answer:   [facility]    Order Specific Question:   Call Results- Best Contact Number?    Answer:   [phone]....Marland KitchenPT CAN LEAVE   Total face to face encounter time for this patient visit was 25 min. >50% of the time was  spent in counseling and coordination of care.  Earlie Server, MD, PhD Hematology Oncology [facility] Pager- [phone] 09/12/18

## 2018-09-12 NOTE — Therapy (Signed)
[facility] [address] [place], [place], [place], [address] Phone: [phone]   Fax:  [phone]  Occupational Therapy Evaluation  Patient Details  Name: Theresa Chaney MRN: [id number] Date of Birth: April 10, 1981 No data recorded  Encounter Date: 09/12/2018    Past Medical History:  Diagnosis Date  . Anemia   . BRCA negative 11/26/2017  . Breast cancer ([place]) 10/11/2017   Multifocal, ER positive, PR negative, HER-2 negative.  . Chronic bronchitis ([place]) 11/2017  . COPD (chronic obstructive pulmonary disease) ([place])    MILD PER CXR  . Depression   . Family history of cancer   . GERD (gastroesophageal reflux disease)   . Headache    MIGRAINES  . Heart murmur    ASYMPTOMATIC  . Personal history of chemotherapy    current for right breast ca    Past Surgical History:  Procedure Laterality Date  . AXILLARY LYMPH NODE DISSECTION Right 03/19/2018   Procedure: AXILLARY LYMPH NODE DISSECTION;  Surgeon: Robert Bellow, MD;  Location: [facility];  Service: General;  Laterality: Right;  . BREAST BIOPSY Right 10/11/2017   12:30 posterior coil clip invasive mammary carcinoma  . BREAST BIOPSY Right 10/11/2017   11:30 middle depth ribbon clip DCIS  . BREAST BIOPSY Right 10/11/2017   5:30 anterior depth x shape invasive ductal carcinoma  . BREAST RECONSTRUCTION WITH PLACEMENT OF TISSUE EXPANDER AND FLEX HD (ACELLULAR HYDRATED DERMIS) Right 03/19/2018   Procedure: BREAST RECONSTRUCTION WITH PLACEMENT OF TISSUE EXPANDER AND FLEX HD (ACELLULAR HYDRATED DERMIS);  Surgeon: Wallace Going, DO;  Location: [facility];  Service: Plastics;  Laterality: Right;  . LAPAROSCOPIC BILATERAL SALPINGO OOPHERECTOMY Bilateral 03/19/2018   Procedure: LAPAROSCOPIC BILATERAL SALPINGO OOPHORECTOMY;  Surgeon: Benjaman Kindler, MD;  Location: [facility];  Service: Gynecology;  Laterality: Bilateral;  . MASTECTOMY Right   . MASTECTOMY W/ SENTINEL NODE BIOPSY  Right 03/19/2018   Procedure: MASTECTOMY WITH SENTINEL LYMPH NODE BIOPSY;  Surgeon: Robert Bellow, MD;  Location: [facility];  Service: General;  Laterality: Right;  . PORTACATH PLACEMENT Left 10/24/2017   Procedure: INSERTION PORT-A-CATH;  Surgeon: Robert Bellow, MD;  Location: [facility];  Service: General;  Laterality: Left;  . REMOVAL OF TISSUE EXPANDER AND PLACEMENT OF IMPLANT Right 07/20/2018   Procedure: REMOVAL OF RIGHT BREAST TISSUE EXPANDER AND PLACEMENT OF IMPLANT;  Surgeon: Wallace Going, DO;  Location: [facility];  Service: Plastics;  Laterality: Right;    There were no vitals filed for this visit.       LYMPHEDEMA/ONCOLOGY QUESTIONNAIRE - 09/12/18 0939      Right Upper Extremity Lymphedema   15 cm Proximal to Olecranon Process  33 cm    10 cm Proximal to Olecranon Process  30.8 cm    Olecranon Process  28 cm    15 cm Proximal to Ulnar Styloid Process  26 cm    10 cm Proximal to Ulnar Styloid Process  22.5 cm    Just Proximal to Ulnar Styloid Process  16.6 cm    Across Hand at [employer]  20.9 cm    At [place] of 2nd Digit  6.9 cm    At [facility] of Thumb  6.9 cm      Left Upper Extremity Lymphedema   15 cm Proximal to Olecranon Process  31.5 cm    10 cm Proximal to Olecranon Process  29.3 cm    Olecranon Process  26.8 cm    15  cm Proximal to Ulnar Styloid Process  25.5 cm    10 cm Proximal to Ulnar Styloid Process  22.5 cm    Just Proximal to Ulnar Styloid Process  17 cm    Across Hand at [employer]  20 cm    At [place] of 2nd Digit  7 cm    At [facility] of Thumb  7 cm         Pt measurements stayed same since last time - and wrist decrease in bilateral hand Pt positive for Tinel over CT and cubital tunnel on R worse than L  Pt report that her Tendon glides and Nerve glides - sometimes irritate things  did discuss with pt no propping up elbows and sleeping and sitting with hands cradle on chest - or sleeping with elbows flex more than 90  degrees  Discus with dr Tasia Catchings - recommend her getting some massage to help with swelling - pt report her face and neck sometimes swollen , as well as feet and ankles when on feet for extended time.  Dr Tasia Catchings ordering Doppler first to rule out any blood clots   Pt to follow up with me again in about 2 wks                          Patient will benefit from skilled therapeutic intervention in order to improve the following deficits and impairments:     Visit Diagnosis: No diagnosis found.    Problem List Patient Active Problem List   Diagnosis Date Noted  . Shortness of breath 08/23/2018  . Cardiomyopathy ([place]) 08/23/2018  . Preprocedural cardiovascular examination 08/23/2018  . Tachycardia 08/23/2018  . Palpitations 08/23/2018  . Estrogen receptor positive status (ER+) 04/04/2018  . Breast cancer of upper-outer quadrant of right female breast ([place]) 03/19/2018  . Family history of cancer   . Malignant neoplasm of overlapping sites of right breast in female, estrogen receptor positive ([place]) 10/19/2017    Rosalyn Gess  OTR/L,CLT 09/12/2018, 10:55 AM  [facility] [address] [place], [place], [place], [address] Phone: [phone]   Fax:  [phone]  Name: Theresa Chaney MRN: [id number] Date of Birth: 1981-06-26

## 2018-09-13 ENCOUNTER — Ambulatory Visit
Admission: RE | Admit: 2018-09-13 | Discharge: 2018-09-13 | Disposition: A | Discharge status: Home / Self Care | Payer: BLUE CROSS/BLUE SHIELD | Source: Ambulatory Visit | Attending: Radiation Oncology | Admitting: Radiation Oncology

## 2018-09-13 DIAGNOSIS — C50411 Malignant neoplasm of upper-outer quadrant of right female breast: Secondary | ICD-10-CM | POA: Diagnosis not present

## 2018-09-13 MED ORDER — PROMETHAZINE HCL 25 MG PO TABS: 25.0000 mg | ORAL_TABLET | Freq: Four times a day (QID) | ORAL | 0 refills | Status: DC | PRN

## 2018-09-13 MED ORDER — PREGABALIN 300 MG PO CAPS: 300.0000 mg | ORAL_CAPSULE | Freq: Every day | ORAL | 0 refills | Status: DC

## 2018-09-14 ENCOUNTER — Ambulatory Visit
Admission: RE | Admit: 2018-09-14 | Discharge: 2018-09-14 | Disposition: A | Discharge status: Home / Self Care | Payer: BLUE CROSS/BLUE SHIELD | Source: Ambulatory Visit | Attending: Radiation Oncology | Admitting: Radiation Oncology

## 2018-09-14 DIAGNOSIS — C50411 Malignant neoplasm of upper-outer quadrant of right female breast: Secondary | ICD-10-CM | POA: Diagnosis not present

## 2018-09-17 ENCOUNTER — Ambulatory Visit
Admission: RE | Admit: 2018-09-17 | Discharge: 2018-09-17 | Disposition: A | Discharge status: Home / Self Care | Payer: BLUE CROSS/BLUE SHIELD | Source: Ambulatory Visit | Attending: Radiation Oncology | Admitting: Radiation Oncology

## 2018-09-17 DIAGNOSIS — C50411 Malignant neoplasm of upper-outer quadrant of right female breast: Secondary | ICD-10-CM | POA: Diagnosis not present

## 2018-09-18 ENCOUNTER — Ambulatory Visit
Admission: RE | Admit: 2018-09-18 | Discharge: 2018-09-18 | Disposition: A | Discharge status: Home / Self Care | Payer: BLUE CROSS/BLUE SHIELD | Source: Ambulatory Visit | Attending: Radiation Oncology | Admitting: Radiation Oncology

## 2018-09-18 ENCOUNTER — Inpatient Hospital Stay: Payer: BLUE CROSS/BLUE SHIELD

## 2018-09-18 DIAGNOSIS — Z170 Estrogen receptor positive status [ER+]: Principal | ICD-10-CM

## 2018-09-18 DIAGNOSIS — C50811 Malignant neoplasm of overlapping sites of right female breast: Secondary | ICD-10-CM

## 2018-09-18 DIAGNOSIS — C50411 Malignant neoplasm of upper-outer quadrant of right female breast: Secondary | ICD-10-CM | POA: Diagnosis not present

## 2018-09-18 LAB — CBC
HCT: 35.2 % (ref 35.0–47.0)
Hemoglobin: 12.2 g/dL (ref 12.0–16.0)
MCH: 30.3 pg (ref 26.0–34.0)
MCHC: 34.6 g/dL (ref 32.0–36.0)
MCV: 87.4 fL (ref 80.0–100.0)
PLATELETS: 263 10*3/uL (ref 150–440)
RBC: 4.03 MIL/uL (ref 3.80–5.20)
RDW: 12.6 % (ref 11.5–14.5)
WBC: 5.4 10*3/uL (ref 3.6–11.0)

## 2018-09-19 ENCOUNTER — Ambulatory Visit
Admission: RE | Admit: 2018-09-19 | Discharge: 2018-09-19 | Disposition: A | Discharge status: Home / Self Care | Payer: BLUE CROSS/BLUE SHIELD | Source: Ambulatory Visit | Attending: Radiation Oncology | Admitting: Radiation Oncology

## 2018-09-19 DIAGNOSIS — C50411 Malignant neoplasm of upper-outer quadrant of right female breast: Secondary | ICD-10-CM | POA: Diagnosis not present

## 2018-09-20 ENCOUNTER — Ambulatory Visit
Admission: RE | Admit: 2018-09-20 | Discharge: 2018-09-20 | Disposition: A | Discharge status: Home / Self Care | Payer: BLUE CROSS/BLUE SHIELD | Source: Ambulatory Visit | Attending: Radiation Oncology | Admitting: Radiation Oncology

## 2018-09-20 DIAGNOSIS — C50411 Malignant neoplasm of upper-outer quadrant of right female breast: Secondary | ICD-10-CM | POA: Diagnosis not present

## 2018-09-21 ENCOUNTER — Encounter: Payer: Self-pay | Admitting: Nurse Practitioner

## 2018-09-21 ENCOUNTER — Ambulatory Visit (INDEPENDENT_AMBULATORY_CARE_PROVIDER_SITE_OTHER): Payer: BLUE CROSS/BLUE SHIELD | Admitting: Nurse Practitioner

## 2018-09-21 ENCOUNTER — Ambulatory Visit
Admission: RE | Admit: 2018-09-21 | Discharge: 2018-09-21 | Disposition: A | Discharge status: Home / Self Care | Payer: BLUE CROSS/BLUE SHIELD | Source: Ambulatory Visit | Attending: Radiation Oncology | Admitting: Radiation Oncology

## 2018-09-21 VITALS — BP 112/70 | HR 63 | Ht 66.0 in | Wt 181.8 lb

## 2018-09-21 DIAGNOSIS — R000 Tachycardia, unspecified: Secondary | ICD-10-CM

## 2018-09-21 DIAGNOSIS — C50411 Malignant neoplasm of upper-outer quadrant of right female breast: Secondary | ICD-10-CM | POA: Diagnosis not present

## 2018-09-21 DIAGNOSIS — I429 Cardiomyopathy, unspecified: Secondary | ICD-10-CM | POA: Diagnosis not present

## 2018-09-21 NOTE — Progress Notes (Signed)
Office Visit    Patient Name: Theresa Chaney Date of Encounter: 09/21/2018  Primary Care Provider:  Marguerita Merles, MD Primary Cardiologist:  Nelva Bush, MD  Chief Complaint    37 y/o ? with a h/o breast cancer and recent finding of mild LV dysfxn, who presents for f/u after recent initiation of  blocker therapy.  Past Medical History    Past Medical History:  Diagnosis Date  . Anemia   . BRCA negative 11/26/2017  . Breast cancer ([place]) 10/11/2017   Multifocal, ER positive, PR negative, HER-2 negative.  . Cardiomyopathy ([place])    a. 10/2017 Echo: EF 60-65%, no rwma, Gr1 DD, nl RV size/fxn; b. 04/2018 Echo: EF 55-60%, no rwma, Nl RV size/fxn; c. 08/2018 Echo: EF 45%, diff HK, ? HK of antsept wall. Gr1 DD. Mild MR. Mild LAE/RAE. Mod dil RV.   [name] Chronic bronchitis ([place]) 11/2017  . COPD (chronic obstructive pulmonary disease) ([place])    MILD PER CXR  . Depression   . Family history of cancer   . GERD (gastroesophageal reflux disease)   . Headache    MIGRAINES  . Heart murmur    ASYMPTOMATIC  . Personal history of chemotherapy    current for right breast ca   Past Surgical History:  Procedure Laterality Date  . AXILLARY LYMPH NODE DISSECTION Right 03/19/2018   Procedure: AXILLARY LYMPH NODE DISSECTION;  Surgeon: Robert Bellow, MD;  Location: [facility];  Service: General;  Laterality: Right;  . BREAST BIOPSY Right 10/11/2017   12:30 posterior coil clip invasive mammary carcinoma  . BREAST BIOPSY Right 10/11/2017   11:30 middle depth ribbon clip DCIS  . BREAST BIOPSY Right 10/11/2017   5:30 anterior depth x shape invasive ductal carcinoma  . BREAST RECONSTRUCTION WITH PLACEMENT OF TISSUE EXPANDER AND FLEX HD (ACELLULAR HYDRATED DERMIS) Right 03/19/2018   Procedure: BREAST RECONSTRUCTION WITH PLACEMENT OF TISSUE EXPANDER AND FLEX HD (ACELLULAR HYDRATED DERMIS);  Surgeon: Wallace Going, DO;  Location: [facility];  Service: Plastics;  Laterality: Right;  . LAPAROSCOPIC  BILATERAL SALPINGO OOPHERECTOMY Bilateral 03/19/2018   Procedure: LAPAROSCOPIC BILATERAL SALPINGO OOPHORECTOMY;  Surgeon: Benjaman Kindler, MD;  Location: [facility];  Service: Gynecology;  Laterality: Bilateral;  . MASTECTOMY Right   . MASTECTOMY W/ SENTINEL NODE BIOPSY Right 03/19/2018   Procedure: MASTECTOMY WITH SENTINEL LYMPH NODE BIOPSY;  Surgeon: Robert Bellow, MD;  Location: [facility];  Service: General;  Laterality: Right;  . PORTACATH PLACEMENT Left 10/24/2017   Procedure: INSERTION PORT-A-CATH;  Surgeon: Robert Bellow, MD;  Location: [facility];  Service: General;  Laterality: Left;  . REMOVAL OF TISSUE EXPANDER AND PLACEMENT OF IMPLANT Right 07/20/2018   Procedure: REMOVAL OF RIGHT BREAST TISSUE EXPANDER AND PLACEMENT OF IMPLANT;  Surgeon: Wallace Going, DO;  Location: [facility];  Service: Plastics;  Laterality: Right;    Allergies  No Known Allergies  History of Present Illness    37 year old female with the above past medical history including right breast cancer status post mastectomy with chemotherapy and current ongoing daily radiation.  She was previously evaluated by Dr. Saunders Revel in the setting of intermittent sinus tachycardia and dyspnea on exertion.  Echocardiograms in November 2018 and May 2019, showed normal LV function.  He was ultimately felt that her symptoms were secondary to chemotherapy.  More recently, she experienced upper extremity swelling and an echocardiogram was repeated in September of this year, showing an EF of 45% with question of anteroseptal hypokinesis  and moderate right ventricular enlargement.  She saw Dr. Saunders Revel on September 11 and echo was reviewed.  It was felt that the EF was closer to 50 to 55%.  She was placed on Toprol-XL 12.5 mg daily and scheduled for a Lexiscan Myoview to rule out ischemia.  She has yet to undergo stress testing in the setting of ongoing radiation but is scheduled for her Myoview on November 1.  Since her last  visit, she believes that she has tolerated beta-blocker therapy well.  She has some degree of chronic fatigue, which she attributes to radiation, but does not feel that it has worsened any since starting beta-blocker.  She has not been having any significant tachycardia.  She has somewhat chronic bilateral hand swelling which has been stable.  She has not been having any lower extremity edema, PND, orthopnea, dizziness, syncope, chest pain, dyspnea, or early satiety.  Home Medications    Prior to Admission medications   Medication Sig Start Date End Date Taking? Authorizing Provider  albuterol (PROVENTIL HFA;VENTOLIN HFA) 108 (90 Base) MCG/ACT inhaler Inhale 2 puffs into the lungs every 6 (six) hours as needed for wheezing or shortness of breath (Cough). 12/11/17  Yes Merlyn Lot, MD  ALPRAZolam Duanne Moron) 0.25 MG tablet Take 0.25 mg at bedtime as needed by mouth for anxiety or sleep.  10/10/17  Yes [provider]  Calcium Carb-Cholecalciferol (CALCIUM 600 + D PO) Take 1 tablet daily by mouth.   Yes [provider]  chlorhexidine (PERIDEX) 0.12 % solution USE AS DIRECTED 15MLS IN THE MOUTH OR THROAT TWICE A DAY 01/03/18  Yes Earlie Server, MD  doxylamine, Sleep, (UNISOM) 25 MG tablet Take 25 mg by mouth at bedtime as needed for sleep.   Yes [provider]  escitalopram (LEXAPRO) 20 MG tablet Take 20 mg by mouth every morning.  07/31/17  Yes [provider]  esomeprazole (NEXIUM) 40 MG capsule Take 40 mg by mouth every morning.  12/26/14  Yes [provider]  ibuprofen (ADVIL,MOTRIN) 800 MG tablet Take 1 tablet (800 mg total) by mouth every 8 (eight) hours as needed for moderate pain. 03/19/18  Yes Benjaman Kindler, MD  letrozole [facility]) 2.5 MG tablet Take 1 tablet (2.5 mg total) by mouth daily. 07/02/18  Yes Earlie Server, MD  loratadine (CLARITIN) 10 MG tablet Take 10 mg by mouth daily.    Yes [provider]  LORazepam (ATIVAN) 1 MG tablet Take 1 mg by  mouth every 8 (eight) hours.   Yes [provider]  metoprolol succinate (TOPROL XL) 25 MG 24 hr tablet Take 0.5 tablets (12.5 mg total) by mouth daily. 08/22/18  Yes End, Harrell Gave, MD  pregabalin (LYRICA) 300 MG capsule Take 1 capsule (300 mg total) by mouth daily. 09/13/18  Yes Earlie Server, MD  prochlorperazine (COMPAZINE) 10 MG tablet Take 1 tablet (10 mg total) by mouth every 6 (six) hours as needed (Nausea or vomiting). 11/08/17  Yes Earlie Server, MD  promethazine (PHENERGAN) 25 MG tablet Take 1 tablet (25 mg total) by mouth every 6 (six) hours as needed for nausea or vomiting. 09/13/18  Yes Earlie Server, MD  pyridOXINE (VITAMIN B-6) 100 MG tablet Take 1 tablet (100 mg total) by mouth daily. 05/28/18  Yes Earlie Server, MD  vitamin C (ASCORBIC ACID) 500 MG tablet Take 500 mg by mouth daily.   Yes [provider]    Review of Systems    Some degree of chronic fatigue and bilateral hand swelling as  outlined above.  She denies chest pain, palpitations, dyspnea, pnd, orthopnea, n, v, dizziness, syncope, lower extremity edema, weight gain, or early satiety. .  All other systems reviewed and are otherwise negative except as noted above.  Physical Exam    VS:  BP 112/70 (BP Location: Left Arm, Patient Position: Sitting, Cuff Size: Normal)   Pulse 63   Ht _0  (1.676 m)   Wt 181 lb 12 oz (82.4 kg)   LMP  (LMP Unknown) Comment: Ovaries and tubes removed.  BMI 29.34 kg/m  , BMI Body mass index is 29.34 kg/m. GEN: Well nourished, well developed, in no acute distress. HEENT: normal. Neck: Supple, no JVD, carotid bruits, or masses. Cardiac: RRR, no murmurs, rubs, or gallops. No clubbing, cyanosis, edema.  Radials/DP/PT 2+ and equal bilaterally.  Respiratory:  Respirations regular and unlabored, clear to auscultation bilaterally. GI: Soft, nontender, nondistended, BS + x 4. MS: no deformity or atrophy. Skin: warm and dry, no rash. Neuro:  Strength and sensation are intact. Psych: Normal  affect.  Accessory Clinical Findings    ECG personally reviewed by me today -regular sinus rhythm, 63, no acute ST or T changes.  Assessment & Plan    1.  Cardiomyopathy: As outlined above, patient had echo in September in the setting of hand swelling and this showed mild LV dysfunction with an EF of 45 to 50%.  She saw Dr. Saunders Revel on September 11 and was placed on low-dose metoprolol, which she has tolerated well.  She has not been having any recurrent tachycardia and also denies any presyncope or syncope.  She is euvolemic on exam today.  Her blood pressure is 112/70 today.  We did briefly discussed the role of medical therapy and LV dysfunction along with titration of medications plus minus addition of ACE/ARB.  We collectively agreed that given somewhat soft blood pressure, we would hold off on any further titration or addition of medications at this time.  She is scheduled for stress testing on November 1.  She is waiting to complete radiation prior to proceeding.  We will contact her following her stress test with the results and otherwise plan to see her back in 2 months.  2.  Sinus tachycardia: Stable on beta-blocker therapy.  3.  Breast cancer: Status post right mastectomy and chemotherapy.  Currently undergoing daily radiation.  Overall, she feels as though she is tolerating things well.  4.  Disposition: Follow-up stress testing November 1.  Follow-up in clinic in 2 months or sooner if necessary.   Murray Hodgkins, NP 09/21/2018, 12:18 PM

## 2018-09-21 NOTE — Patient Instructions (Signed)
Medication Instructions:  - Your physician recommends that you continue on your current medications as directed. Please refer to the Current Medication list given to you today.  If you need a refill on your cardiac medications before your next appointment, please call your pharmacy.   Lab work: - none ordered  If you have labs (blood work) drawn today and your tests are completely normal, you will receive your results only by: [name] MyChart Message (if you have MyChart) OR . A paper copy in the mail If you have any lab test that is abnormal or we need to change your treatment, we will call you to review the results.  Testing/Procedures: Leane Call (nuclear stress test) as scheduled on 10/12/18.  Follow-Up: At [facility], you and your health needs are our priority.  As part of our continuing mission to provide you with exceptional heart care, we have created designated Provider Care Teams.  These Care Teams include your primary Cardiologist (physician) and Advanced Practice Providers (APPs -  Physician Assistants and Nurse Practitioners) who all work together to provide you with the care you need, when you need it. You will need a follow up appointment in 2 months.  You may see Nelva Bush, MD or one of the following Advanced Practice Providers on your designated Care Team:   Murray Hodgkins, NP Christell Faith, PA-C . Marrianne Mood, PA-C  Any Other Special Instructions Will Be Listed Below (If Applicable). - N/A

## 2018-09-24 ENCOUNTER — Inpatient Hospital Stay: Payer: BLUE CROSS/BLUE SHIELD

## 2018-09-24 ENCOUNTER — Ambulatory Visit
Admission: RE | Admit: 2018-09-24 | Discharge: 2018-09-24 | Disposition: A | Discharge status: Home / Self Care | Payer: BLUE CROSS/BLUE SHIELD | Source: Ambulatory Visit | Attending: Radiation Oncology | Admitting: Radiation Oncology

## 2018-09-24 ENCOUNTER — Encounter: Payer: Self-pay | Admitting: *Deleted

## 2018-09-24 ENCOUNTER — Ambulatory Visit
Admission: RE | Admit: 2018-09-24 | Discharge: 2018-09-24 | Disposition: A | Discharge status: Home / Self Care | Payer: BLUE CROSS/BLUE SHIELD | Source: Ambulatory Visit | Attending: Oncology | Admitting: Oncology

## 2018-09-24 DIAGNOSIS — R918 Other nonspecific abnormal finding of lung field: Secondary | ICD-10-CM | POA: Diagnosis not present

## 2018-09-24 DIAGNOSIS — M7989 Other specified soft tissue disorders: Secondary | ICD-10-CM | POA: Diagnosis not present

## 2018-09-24 DIAGNOSIS — Z95828 Presence of other vascular implants and grafts: Secondary | ICD-10-CM

## 2018-09-24 DIAGNOSIS — R221 Localized swelling, mass and lump, neck: Secondary | ICD-10-CM

## 2018-09-24 DIAGNOSIS — C50411 Malignant neoplasm of upper-outer quadrant of right female breast: Secondary | ICD-10-CM | POA: Diagnosis not present

## 2018-09-24 DIAGNOSIS — C50811 Malignant neoplasm of overlapping sites of right female breast: Secondary | ICD-10-CM | POA: Diagnosis not present

## 2018-09-24 IMAGING — CT CT ABD-PELV W/ CM
2 of 4 series · 12 of 36 positions shown, 15 images · IV contrast (iopamidol)
Comparison: CT the chest, abdomen and pelvis [DATE].

CLINICAL DATA: 37-year-old female with history of breast cancer
status post right mastectomy and breast reconstruction. Followup
study.

EXAM:
CT CHEST, ABDOMEN, AND PELVIS WITH CONTRAST
TECHNIQUE: Multidetector CT imaging of the chest, abdomen and pelvis was
performed following the standard protocol during bolus
administration of intravenous contrast.
CONTRAST:  100mL [7F] IOPAMIDOL ([7F]) INJECTION 61%

[Series 2: axials cap · axial · 0.70mm/px · z∈[-1621,-1081]mm · 9 of 130 slices shown, 12 images]
[im 11/130  mediastinal]
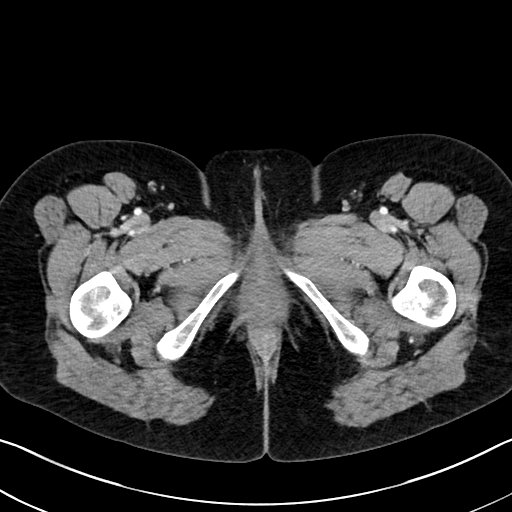
[im 11/130  lung]
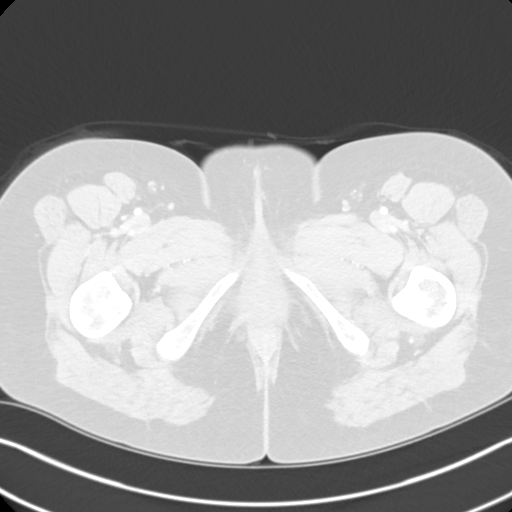
[im 22/130  lung]
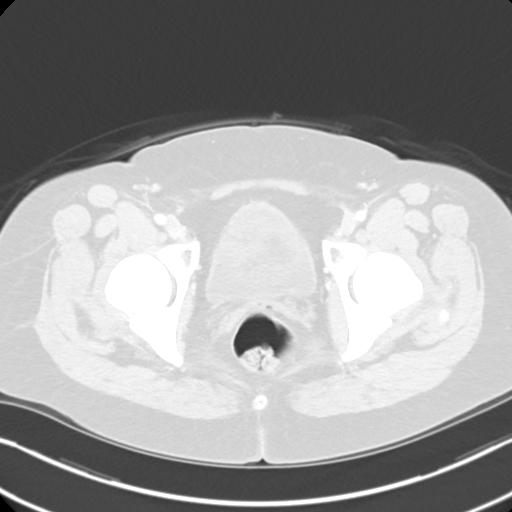
[im 44/130  lung]
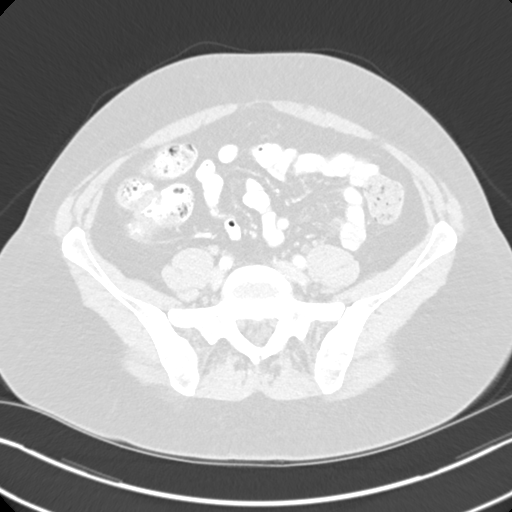
[im 54/130  lung]
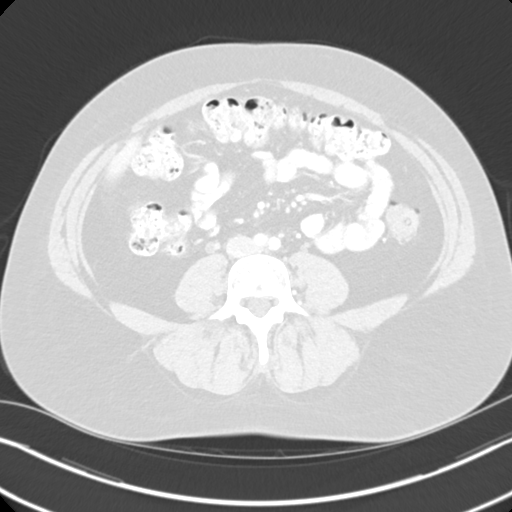
[im 65/130  mediastinal]
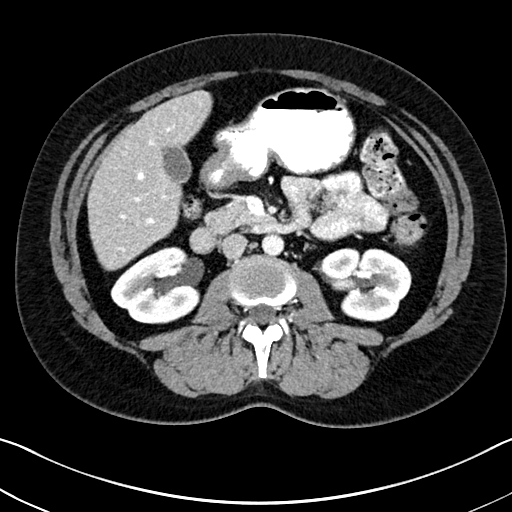
[im 65/130  lung]
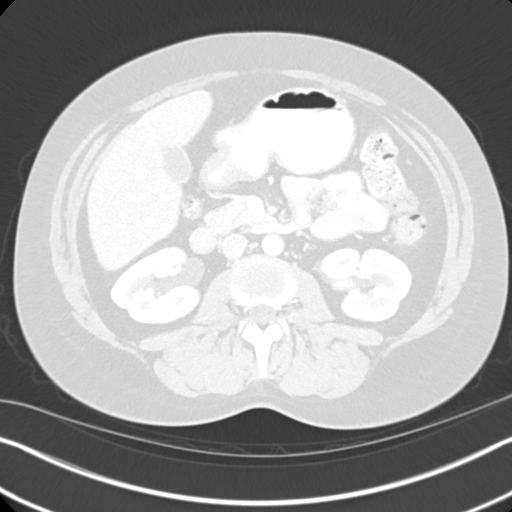
[im 76/130  lung]
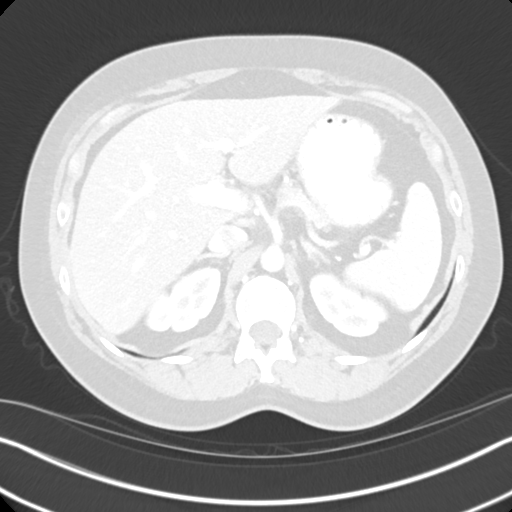
[im 87/130  lung]
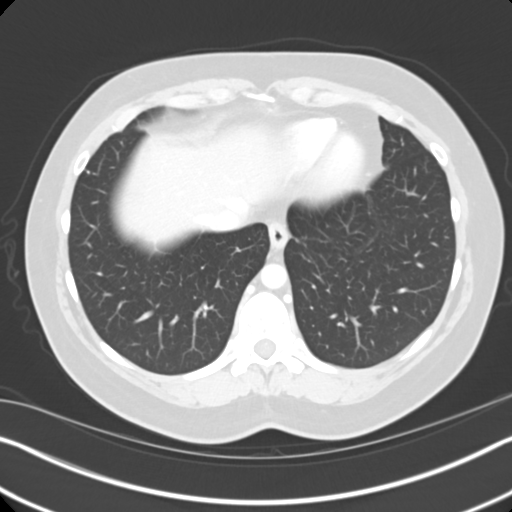
[im 108/130  lung]
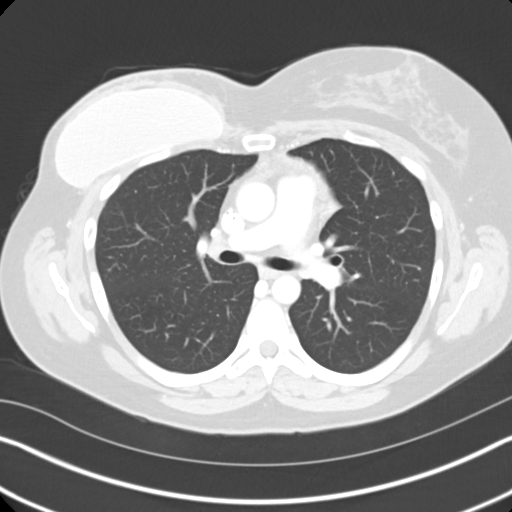
[im 119/130  mediastinal]
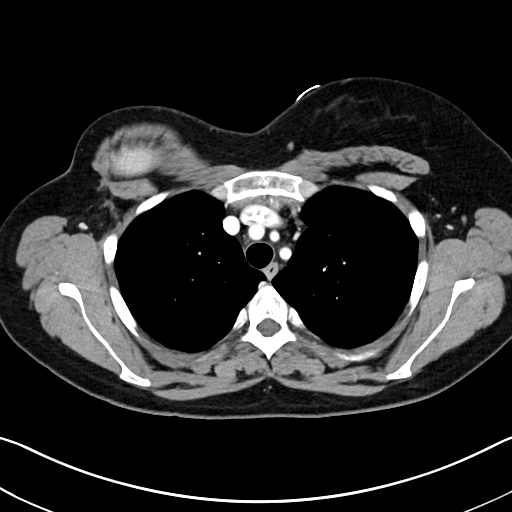
[im 119/130  lung]
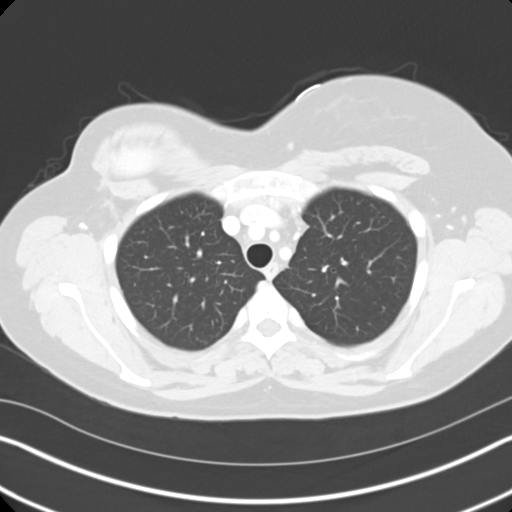

[Series 4: coronals cap · coronal · 0.70mm/px · 3 of 153 slices shown]
[im 31/153  lung]
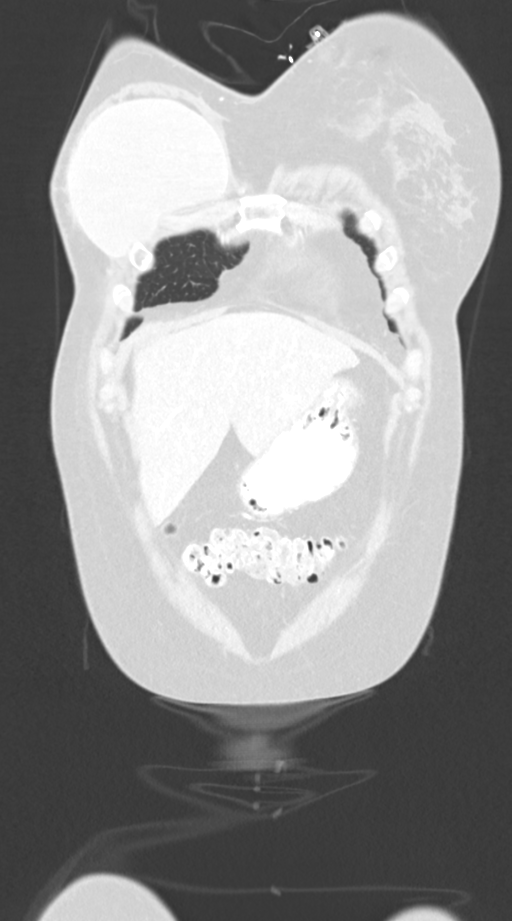
[im 61/153  lung]
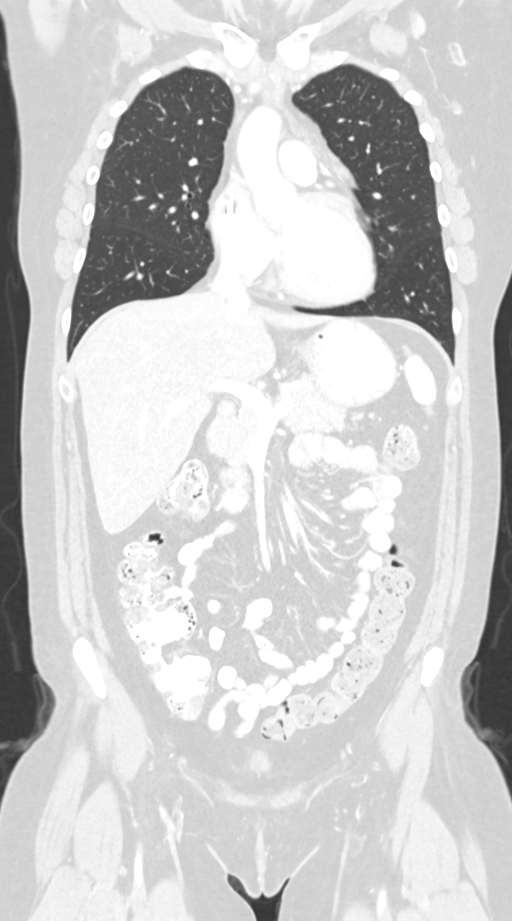
[im 92/153  lung]
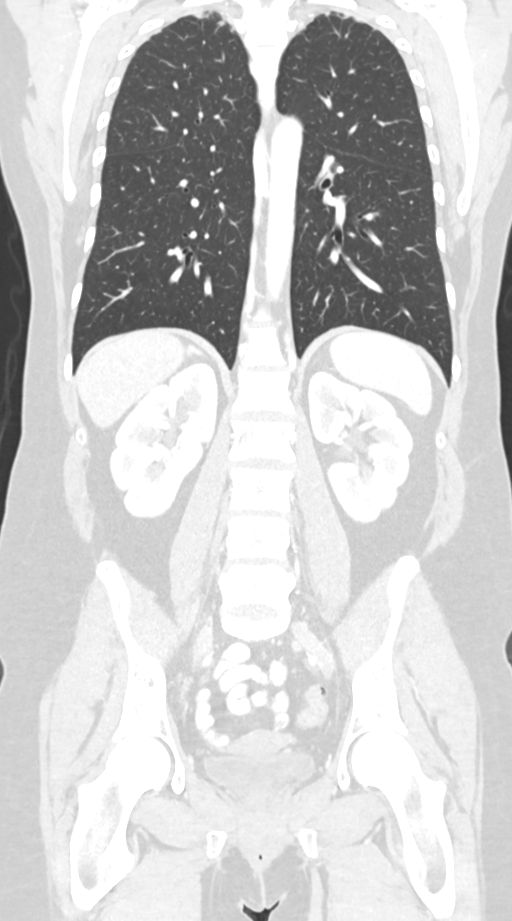

[12 of 36 positions shown; findings below may reference images not displayed]

FINDINGS: CT CHEST FINDINGS

Cardiovascular: Heart size is normal. There is no significant
pericardial fluid, thickening or pericardial calcification. No
atherosclerotic calcifications noted in the thoracic aorta or the
coronary arteries. Left-sided single-lumen subclavian porta cath in
place with tip terminating in the right atrium.

Mediastinum/Nodes: No pathologically enlarged mediastinal, internal
mammary or hilar lymph nodes. Esophagus is unremarkable in
appearance. No axillary lymphadenopathy. Surgical clips in the right
axilla from prior lymph node dissection.

Lungs/Pleura: 5 mm nodule in the posterior aspect of the right upper
lobe (axial image 50 of series 3) and 3 mm nodule in the periphery
of the left lower lobe (axial image 106 of series 3) are both
unchanged compared to prior examination from [DATE], and favored
to be benign. No other larger more suspicious appearing pulmonary
nodules or masses are noted. No acute consolidative airspace
disease. No pleural effusions.

Musculoskeletal: Status post right modified radical mastectomy with
subpectoral breast implant. There are no aggressive appearing lytic
or blastic lesions noted in the visualized portions of the skeleton.

CT ABDOMEN PELVIS FINDINGS

Hepatobiliary: No suspicious cystic or solid hepatic lesions. No
intra or extrahepatic biliary ductal dilatation. Gallbladder is
normal in appearance.

Pancreas: No pancreatic mass. No pancreatic ductal dilatation. No
pancreatic or peripancreatic fluid or inflammatory changes.

Spleen: Unremarkable.

Adrenals/Urinary Tract: Bilateral kidneys and bilateral adrenal
glands are normal in appearance. No hydroureteronephrosis. Urinary
bladder is normal in appearance.

Stomach/Bowel: Normal appearance of the stomach. No pathologic
dilatation of small bowel or colon. Normal appendix.

Vascular/Lymphatic: No significant atherosclerotic disease, aneurysm
or dissection noted in the abdominal or pelvic vasculature. No
lymphadenopathy noted in the abdomen or pelvis.

Reproductive: Uterus and ovaries are unremarkable in appearance.

Other: No significant volume of ascites.  No pneumoperitoneum.

Musculoskeletal: There are no aggressive appearing lytic or blastic
lesions noted in the visualized portions of the skeleton.
IMPRESSION: 1. Status post right modified radical mastectomy and breast
reconstruction. No findings to suggest metastatic disease in the
chest, abdomen or pelvis.
2. Tiny pulmonary nodules measuring 5 mm or less in size, stable
compared to the prior study. These are nonspecific, but favored to
be benign. Continued attention on follow-up studies is recommended.

## 2018-09-24 MED ORDER — HEPARIN SOD (PORK) LOCK FLUSH 100 UNIT/ML IV SOLN: 500.0000 [IU] | Freq: Once | INTRAVENOUS | Status: AC

## 2018-09-24 MED ORDER — IOPAMIDOL (ISOVUE-300) INJECTION 61%
100.0000 mL | Freq: Once | INTRAVENOUS | Status: AC | PRN
  Administered 2018-09-24: 100 mL via INTRAVENOUS

## 2018-09-24 MED ORDER — SODIUM CHLORIDE 0.9% FLUSH
10.0000 mL | Freq: Once | INTRAVENOUS | Status: AC
  Administered 2018-09-24: 10 mL via INTRAVENOUS
  Filled 2018-09-24: qty 10

## 2018-09-25 ENCOUNTER — Ambulatory Visit
Admission: RE | Admit: 2018-09-25 | Discharge: 2018-09-25 | Disposition: A | Discharge status: Home / Self Care | Payer: BLUE CROSS/BLUE SHIELD | Source: Ambulatory Visit | Attending: Radiation Oncology | Admitting: Radiation Oncology

## 2018-09-25 DIAGNOSIS — C50411 Malignant neoplasm of upper-outer quadrant of right female breast: Secondary | ICD-10-CM | POA: Diagnosis not present

## 2018-09-26 ENCOUNTER — Ambulatory Visit
Admission: RE | Admit: 2018-09-26 | Discharge: 2018-09-26 | Disposition: A | Discharge status: Home / Self Care | Payer: BLUE CROSS/BLUE SHIELD | Source: Ambulatory Visit | Attending: Radiation Oncology | Admitting: Radiation Oncology

## 2018-09-26 DIAGNOSIS — C50411 Malignant neoplasm of upper-outer quadrant of right female breast: Secondary | ICD-10-CM | POA: Diagnosis not present

## 2018-09-27 ENCOUNTER — Ambulatory Visit
Admission: RE | Admit: 2018-09-27 | Discharge: 2018-09-27 | Disposition: A | Discharge status: Home / Self Care | Payer: BLUE CROSS/BLUE SHIELD | Source: Ambulatory Visit | Attending: Radiation Oncology | Admitting: Radiation Oncology

## 2018-09-27 ENCOUNTER — Ambulatory Visit: Payer: BLUE CROSS/BLUE SHIELD

## 2018-09-27 DIAGNOSIS — C50411 Malignant neoplasm of upper-outer quadrant of right female breast: Secondary | ICD-10-CM | POA: Diagnosis not present

## 2018-09-28 ENCOUNTER — Ambulatory Visit
Admission: RE | Admit: 2018-09-28 | Discharge: 2018-09-28 | Disposition: A | Discharge status: Home / Self Care | Payer: BLUE CROSS/BLUE SHIELD | Source: Ambulatory Visit | Attending: Radiation Oncology | Admitting: Radiation Oncology

## 2018-09-28 DIAGNOSIS — C50411 Malignant neoplasm of upper-outer quadrant of right female breast: Secondary | ICD-10-CM | POA: Diagnosis not present

## 2018-10-01 ENCOUNTER — Ambulatory Visit
Admission: RE | Admit: 2018-10-01 | Discharge: 2018-10-01 | Disposition: A | Discharge status: Home / Self Care | Payer: BLUE CROSS/BLUE SHIELD | Source: Ambulatory Visit | Attending: Radiation Oncology | Admitting: Radiation Oncology

## 2018-10-01 DIAGNOSIS — C50411 Malignant neoplasm of upper-outer quadrant of right female breast: Secondary | ICD-10-CM | POA: Diagnosis not present

## 2018-10-02 ENCOUNTER — Inpatient Hospital Stay: Payer: BLUE CROSS/BLUE SHIELD

## 2018-10-02 ENCOUNTER — Ambulatory Visit
Admission: RE | Admit: 2018-10-02 | Discharge: 2018-10-02 | Disposition: A | Discharge status: Home / Self Care | Payer: BLUE CROSS/BLUE SHIELD | Source: Ambulatory Visit | Attending: Radiation Oncology | Admitting: Radiation Oncology

## 2018-10-02 DIAGNOSIS — C50411 Malignant neoplasm of upper-outer quadrant of right female breast: Secondary | ICD-10-CM | POA: Diagnosis not present

## 2018-10-03 ENCOUNTER — Ambulatory Visit
Admission: RE | Admit: 2018-10-03 | Discharge: 2018-10-03 | Disposition: A | Discharge status: Home / Self Care | Payer: BLUE CROSS/BLUE SHIELD | Source: Ambulatory Visit | Attending: Radiation Oncology | Admitting: Radiation Oncology

## 2018-10-03 DIAGNOSIS — C50411 Malignant neoplasm of upper-outer quadrant of right female breast: Secondary | ICD-10-CM | POA: Diagnosis not present

## 2018-10-04 ENCOUNTER — Ambulatory Visit
Admission: RE | Admit: 2018-10-04 | Discharge: 2018-10-04 | Disposition: A | Discharge status: Home / Self Care | Payer: BLUE CROSS/BLUE SHIELD | Source: Ambulatory Visit | Attending: Radiation Oncology | Admitting: Radiation Oncology

## 2018-10-04 DIAGNOSIS — C50411 Malignant neoplasm of upper-outer quadrant of right female breast: Secondary | ICD-10-CM | POA: Diagnosis not present

## 2018-10-05 ENCOUNTER — Ambulatory Visit
Admission: RE | Admit: 2018-10-05 | Discharge: 2018-10-05 | Disposition: A | Discharge status: Home / Self Care | Payer: BLUE CROSS/BLUE SHIELD | Source: Ambulatory Visit | Attending: Radiation Oncology | Admitting: Radiation Oncology

## 2018-10-05 DIAGNOSIS — C50411 Malignant neoplasm of upper-outer quadrant of right female breast: Secondary | ICD-10-CM | POA: Diagnosis not present

## 2018-10-08 ENCOUNTER — Ambulatory Visit
Admission: RE | Admit: 2018-10-08 | Discharge: 2018-10-08 | Disposition: A | Discharge status: Home / Self Care | Payer: BLUE CROSS/BLUE SHIELD | Source: Ambulatory Visit | Attending: Radiation Oncology | Admitting: Radiation Oncology

## 2018-10-08 ENCOUNTER — Ambulatory Visit
Admission: RE | Admit: 2018-10-08 | Discharge: 2018-10-08 | Disposition: A | Discharge status: Home / Self Care | Payer: BLUE CROSS/BLUE SHIELD | Source: Ambulatory Visit | Attending: General Surgery | Admitting: General Surgery

## 2018-10-08 DIAGNOSIS — C50411 Malignant neoplasm of upper-outer quadrant of right female breast: Secondary | ICD-10-CM | POA: Diagnosis not present

## 2018-10-08 DIAGNOSIS — Z170 Estrogen receptor positive status [ER+]: Secondary | ICD-10-CM | POA: Diagnosis present

## 2018-10-08 DIAGNOSIS — C50811 Malignant neoplasm of overlapping sites of right female breast: Secondary | ICD-10-CM | POA: Diagnosis not present

## 2018-10-09 ENCOUNTER — Ambulatory Visit
Admission: RE | Admit: 2018-10-09 | Discharge: 2018-10-09 | Disposition: A | Discharge status: Home / Self Care | Payer: BLUE CROSS/BLUE SHIELD | Source: Ambulatory Visit | Attending: Radiation Oncology | Admitting: Radiation Oncology

## 2018-10-09 DIAGNOSIS — C50411 Malignant neoplasm of upper-outer quadrant of right female breast: Secondary | ICD-10-CM | POA: Diagnosis not present

## 2018-10-10 ENCOUNTER — Ambulatory Visit
Admission: RE | Admit: 2018-10-10 | Discharge: 2018-10-10 | Disposition: A | Discharge status: Home / Self Care | Payer: BLUE CROSS/BLUE SHIELD | Source: Ambulatory Visit | Attending: Radiation Oncology | Admitting: Radiation Oncology

## 2018-10-10 DIAGNOSIS — C50411 Malignant neoplasm of upper-outer quadrant of right female breast: Secondary | ICD-10-CM | POA: Diagnosis not present

## 2018-10-12 ENCOUNTER — Encounter
Admission: RE | Admit: 2018-10-12 | Discharge: 2018-10-12 | Disposition: A | Discharge status: Home / Self Care | Payer: BLUE CROSS/BLUE SHIELD | Source: Ambulatory Visit | Attending: Internal Medicine | Admitting: Internal Medicine

## 2018-10-12 DIAGNOSIS — R0602 Shortness of breath: Secondary | ICD-10-CM | POA: Insufficient documentation

## 2018-10-12 DIAGNOSIS — I429 Cardiomyopathy, unspecified: Secondary | ICD-10-CM | POA: Diagnosis present

## 2018-10-12 LAB — NM MYOCAR MULTI W/SPECT W/WALL MOTION / EF
CSEPEDS: 0 s
Estimated workload: 1 METS
Exercise duration (min): 0 min
LV dias vol: 77 mL (ref 46–106)
LVSYSVOL: 45 mL
MPHR: 185 {beats}/min
NUC STRESS TID: 0.89
Peak HR: 122 {beats}/min
Percent HR: 65 %
Rest HR: 70 {beats}/min
SDS: 2
SRS: 1
SSS: 2

## 2018-10-12 MED ORDER — TECHNETIUM TC 99M TETROFOSMIN IV KIT
31.2000 | PACK | Freq: Once | INTRAVENOUS | Status: AC | PRN
  Administered 2018-10-12: 31.2 via INTRAVENOUS

## 2018-10-12 MED ORDER — REGADENOSON 0.4 MG/5ML IV SOLN
0.4000 mg | Freq: Once | INTRAVENOUS | Status: AC
  Administered 2018-10-12: 0.4 mg via INTRAVENOUS
  Filled 2018-10-12: qty 5

## 2018-10-12 MED ORDER — TECHNETIUM TC 99M TETROFOSMIN IV KIT
10.0000 | PACK | Freq: Once | INTRAVENOUS | Status: AC | PRN
  Administered 2018-10-12: 10.6 via INTRAVENOUS

## 2018-10-19 ENCOUNTER — Ambulatory Visit
Admission: RE | Admit: 2018-10-19 | Discharge: 2018-10-19 | Disposition: A | Discharge status: Home / Self Care | Payer: BLUE CROSS/BLUE SHIELD | Source: Ambulatory Visit | Attending: Oncology | Admitting: Oncology

## 2018-10-19 ENCOUNTER — Inpatient Hospital Stay (HOSPITAL_BASED_OUTPATIENT_CLINIC_OR_DEPARTMENT_OTHER): Payer: BLUE CROSS/BLUE SHIELD | Admitting: Oncology

## 2018-10-19 ENCOUNTER — Encounter: Payer: Self-pay | Admitting: Oncology

## 2018-10-19 ENCOUNTER — Other Ambulatory Visit: Payer: Self-pay

## 2018-10-19 ENCOUNTER — Inpatient Hospital Stay: Payer: BLUE CROSS/BLUE SHIELD | Attending: Oncology

## 2018-10-19 ENCOUNTER — Other Ambulatory Visit: Payer: Self-pay | Admitting: Oncology

## 2018-10-19 VITALS — BP 117/74 | HR 80 | Temp 96.8°F | Resp 18 | Wt 189.6 lb

## 2018-10-19 DIAGNOSIS — Z170 Estrogen receptor positive status [ER+]: Secondary | ICD-10-CM | POA: Insufficient documentation

## 2018-10-19 DIAGNOSIS — Z923 Personal history of irradiation: Secondary | ICD-10-CM | POA: Diagnosis not present

## 2018-10-19 DIAGNOSIS — Z79899 Other long term (current) drug therapy: Secondary | ICD-10-CM | POA: Insufficient documentation

## 2018-10-19 DIAGNOSIS — G620 Drug-induced polyneuropathy: Secondary | ICD-10-CM

## 2018-10-19 DIAGNOSIS — M7989 Other specified soft tissue disorders: Secondary | ICD-10-CM

## 2018-10-19 DIAGNOSIS — Z87891 Personal history of nicotine dependence: Secondary | ICD-10-CM | POA: Insufficient documentation

## 2018-10-19 DIAGNOSIS — Z90722 Acquired absence of ovaries, bilateral: Secondary | ICD-10-CM

## 2018-10-19 DIAGNOSIS — M25562 Pain in left knee: Secondary | ICD-10-CM | POA: Diagnosis not present

## 2018-10-19 DIAGNOSIS — M25511 Pain in right shoulder: Secondary | ICD-10-CM

## 2018-10-19 DIAGNOSIS — M25561 Pain in right knee: Secondary | ICD-10-CM | POA: Diagnosis not present

## 2018-10-19 DIAGNOSIS — Z9221 Personal history of antineoplastic chemotherapy: Secondary | ICD-10-CM | POA: Diagnosis not present

## 2018-10-19 DIAGNOSIS — T451X5D Adverse effect of antineoplastic and immunosuppressive drugs, subsequent encounter: Secondary | ICD-10-CM | POA: Diagnosis not present

## 2018-10-19 DIAGNOSIS — F1190 Opioid use, unspecified, uncomplicated: Secondary | ICD-10-CM

## 2018-10-19 DIAGNOSIS — Z421 Encounter for breast reconstruction following mastectomy: Secondary | ICD-10-CM | POA: Insufficient documentation

## 2018-10-19 DIAGNOSIS — M25512 Pain in left shoulder: Secondary | ICD-10-CM | POA: Diagnosis not present

## 2018-10-19 DIAGNOSIS — Z9011 Acquired absence of right breast and nipple: Secondary | ICD-10-CM

## 2018-10-19 DIAGNOSIS — Z79811 Long term (current) use of aromatase inhibitors: Secondary | ICD-10-CM | POA: Diagnosis not present

## 2018-10-19 DIAGNOSIS — Z90710 Acquired absence of both cervix and uterus: Secondary | ICD-10-CM | POA: Insufficient documentation

## 2018-10-19 DIAGNOSIS — Z9079 Acquired absence of other genital organ(s): Secondary | ICD-10-CM | POA: Diagnosis not present

## 2018-10-19 DIAGNOSIS — R0602 Shortness of breath: Secondary | ICD-10-CM | POA: Insufficient documentation

## 2018-10-19 DIAGNOSIS — C50811 Malignant neoplasm of overlapping sites of right female breast: Secondary | ICD-10-CM

## 2018-10-19 DIAGNOSIS — F329 Major depressive disorder, single episode, unspecified: Secondary | ICD-10-CM | POA: Insufficient documentation

## 2018-10-19 DIAGNOSIS — Z780 Asymptomatic menopausal state: Secondary | ICD-10-CM

## 2018-10-19 DIAGNOSIS — M899 Disorder of bone, unspecified: Secondary | ICD-10-CM | POA: Diagnosis not present

## 2018-10-19 DIAGNOSIS — I5021 Acute systolic (congestive) heart failure: Secondary | ICD-10-CM

## 2018-10-19 DIAGNOSIS — T451X5A Adverse effect of antineoplastic and immunosuppressive drugs, initial encounter: Secondary | ICD-10-CM

## 2018-10-19 LAB — CBC WITH DIFFERENTIAL/PLATELET
Abs Immature Granulocytes: 0.01 10*3/uL (ref 0.00–0.07)
Basophils Absolute: 0 10*3/uL (ref 0.0–0.1)
Basophils Relative: 1 %
EOS ABS: 0.1 10*3/uL (ref 0.0–0.5)
EOS PCT: 1 %
HEMATOCRIT: 35.1 % — AB (ref 36.0–46.0)
HEMOGLOBIN: 11.5 g/dL — AB (ref 12.0–15.0)
IMMATURE GRANULOCYTES: 0 %
LYMPHS ABS: 1.5 10*3/uL (ref 0.7–4.0)
Lymphocytes Relative: 23 %
MCH: 28.9 pg (ref 26.0–34.0)
MCHC: 32.8 g/dL (ref 30.0–36.0)
MCV: 88.2 fL (ref 80.0–100.0)
MONOS PCT: 11 %
Monocytes Absolute: 0.7 10*3/uL (ref 0.1–1.0)
NEUTROS PCT: 64 %
Neutro Abs: 4.1 10*3/uL (ref 1.7–7.7)
Platelets: 249 10*3/uL (ref 150–400)
RBC: 3.98 MIL/uL (ref 3.87–5.11)
RDW: 12.6 % (ref 11.5–15.5)
WBC: 6.5 10*3/uL (ref 4.0–10.5)
nRBC: 0 % (ref 0.0–0.2)

## 2018-10-19 IMAGING — CR DG SHOULDER 2+V*R*
1 series · 3 of 3 positions shown · non-contrast
Comparison: None.

CLINICAL DATA: No known injury, hx of right breast CA, chemo
finished [DM], radiation finished 10-19. Pain bilat shoulders and
knees when moving. No swelling, no bruising, no deformity.

EXAM:
RIGHT SHOULDER - 2+ VIEW

[Series 1: dg shoulder right · 0.14mm/px · 3 of 3 slices shown]
[im 1/3]
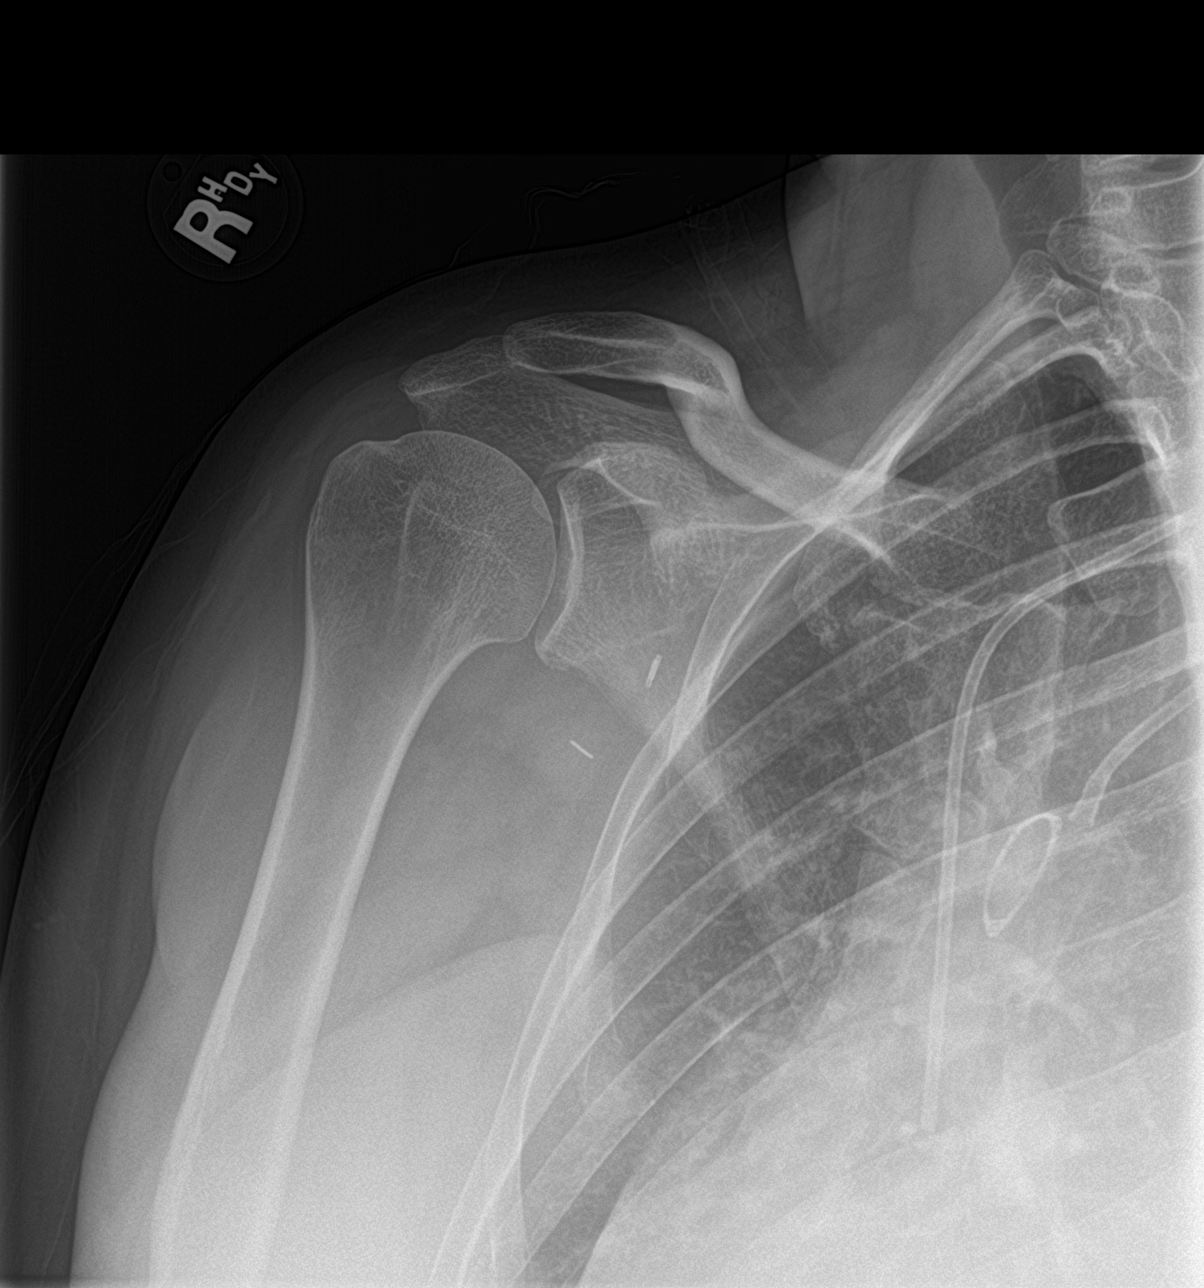
[im 2/3]
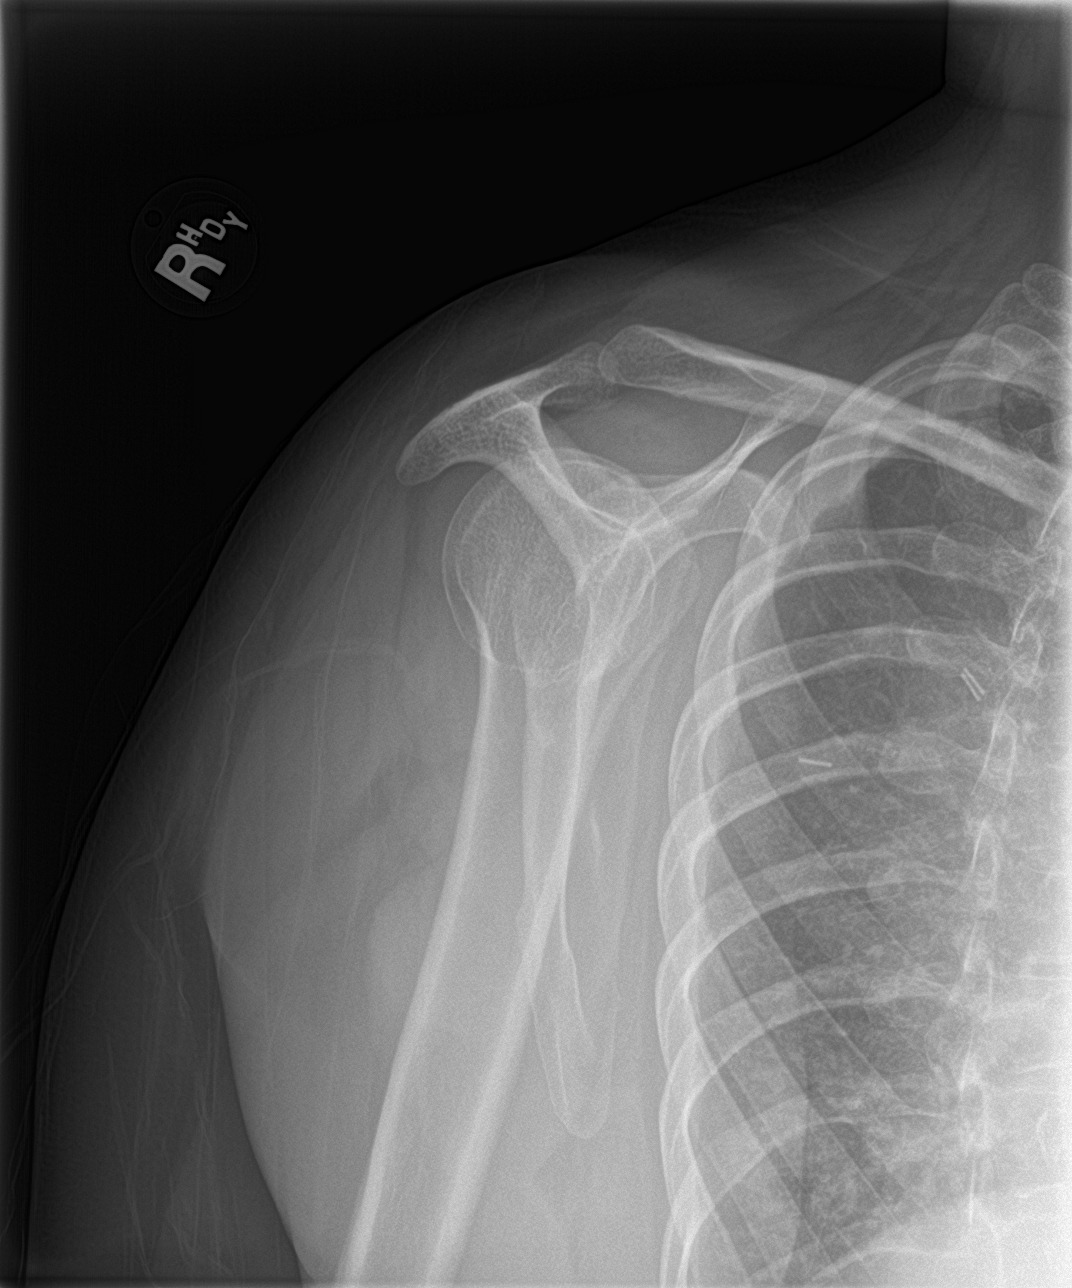
[im 3/3]
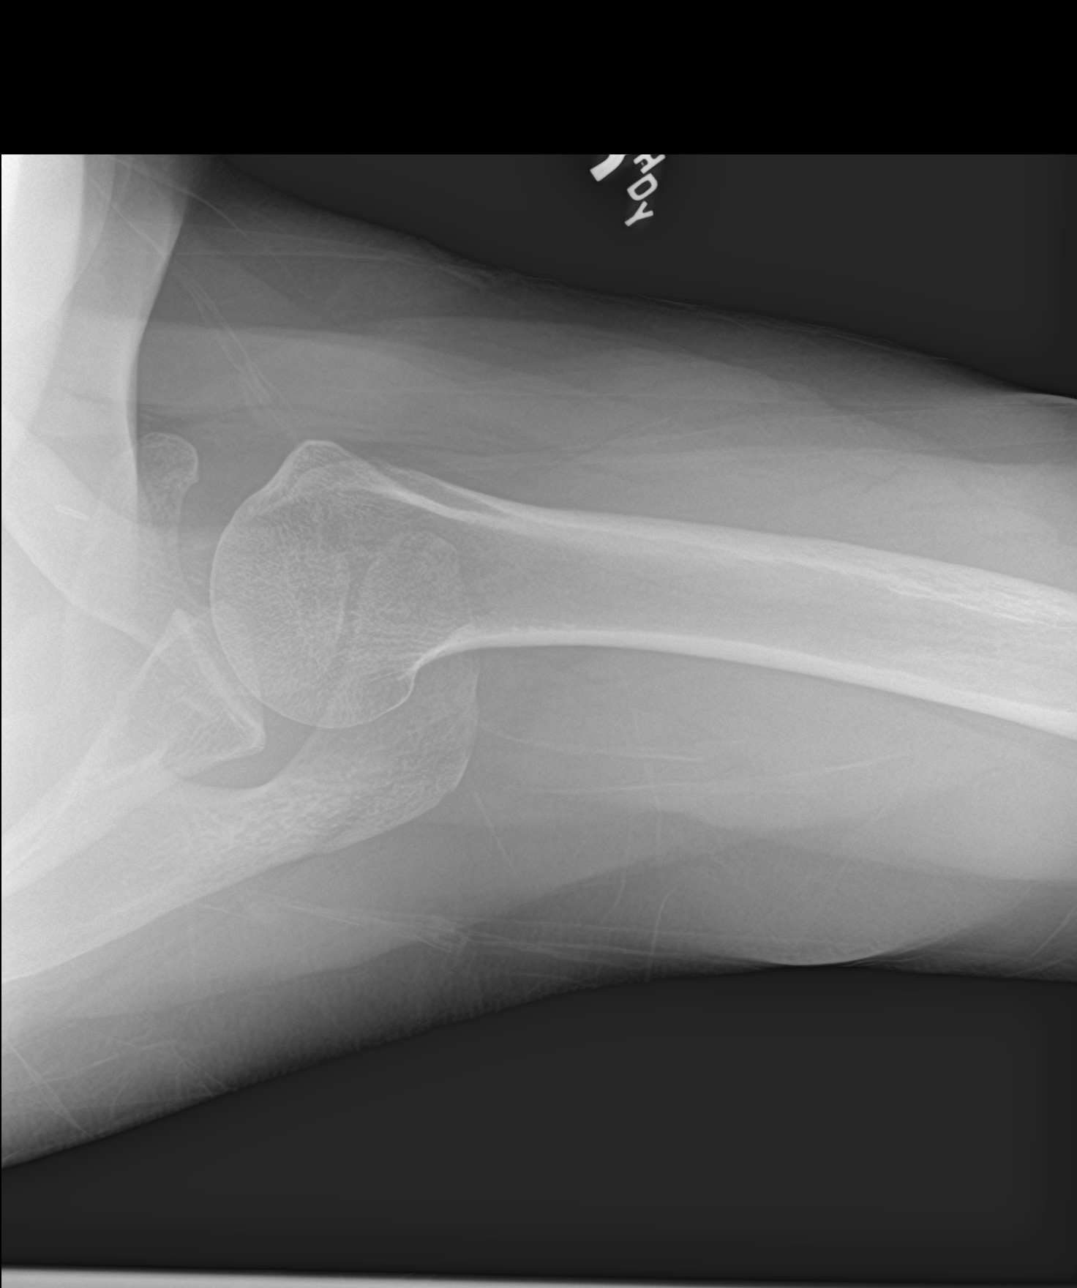

[3 of 3 positions shown; findings below may reference images not displayed]

FINDINGS: There is no evidence of fracture or dislocation. There is no
evidence of arthropathy or other focal bone abnormality. Soft
tissues are unremarkable.
IMPRESSION: Negative.

## 2018-10-19 IMAGING — CR DG SHOULDER 2+V*L*
1 series · 3 of 3 positions shown · non-contrast
Comparison: None.

CLINICAL DATA: No known injury, hx of right breast CA, chemo
finished [KL], radiation finished 10-19. Pain bilat shoulders and
knees when moving. No swelling, no bruising, no deformity.

EXAM:
LEFT SHOULDER - 2+ VIEW

[Series 1: dg shoulder left · 0.14mm/px · 3 of 3 slices shown]
[im 1/3]
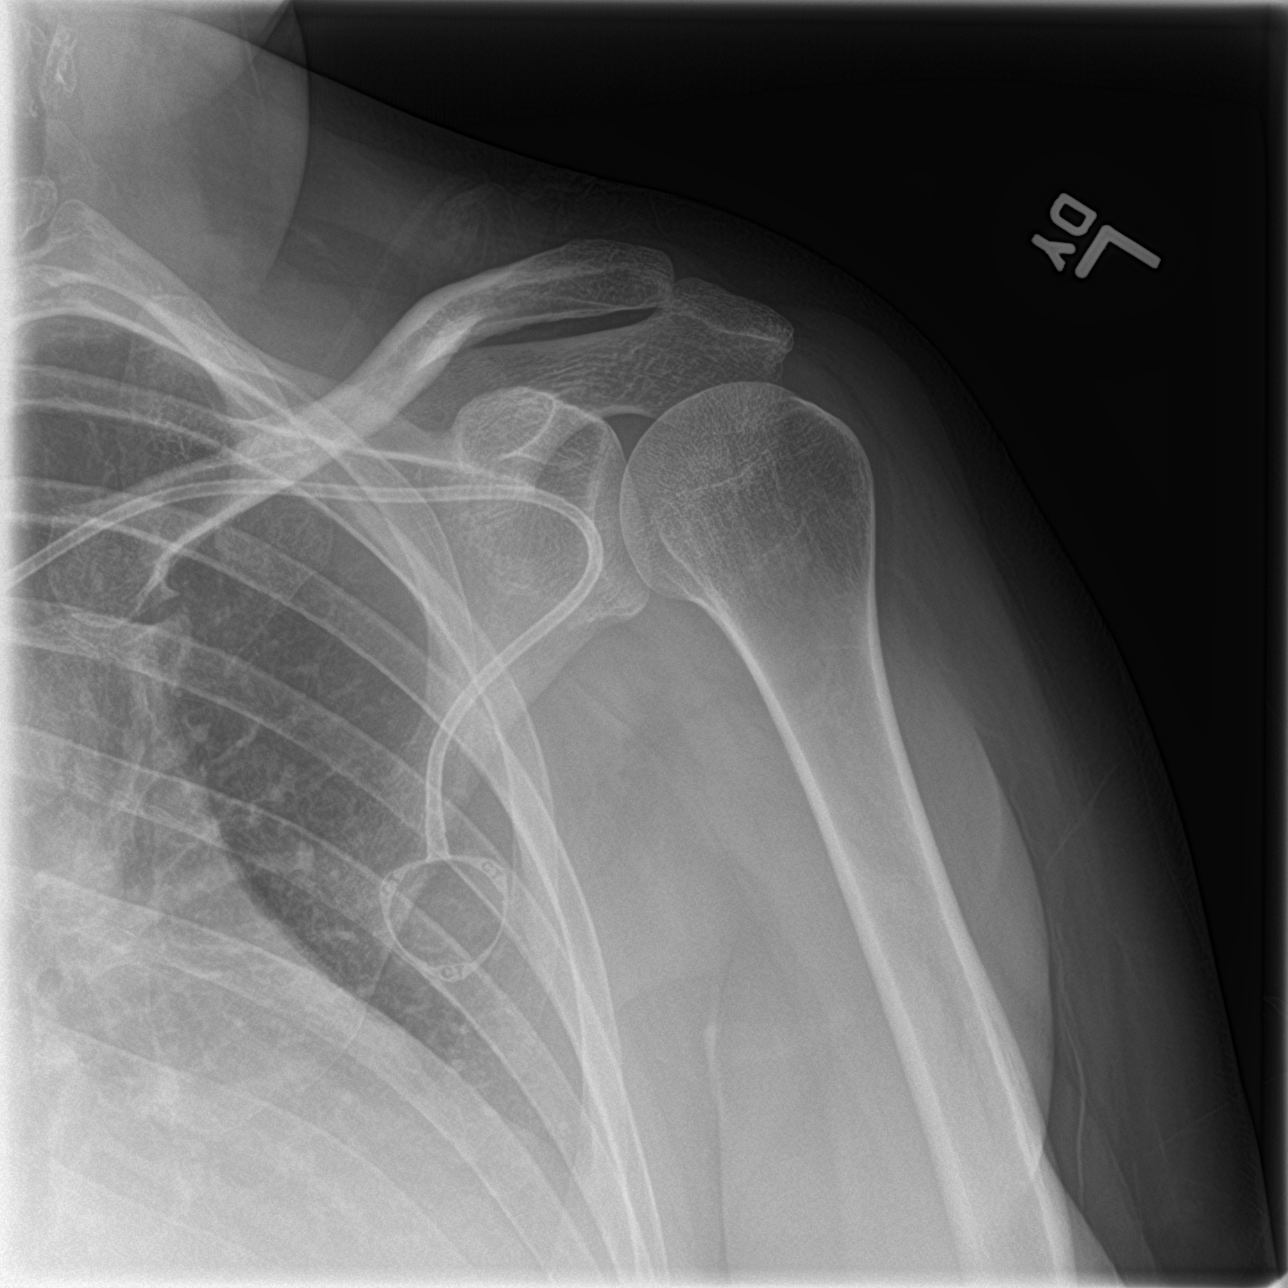
[im 2/3]
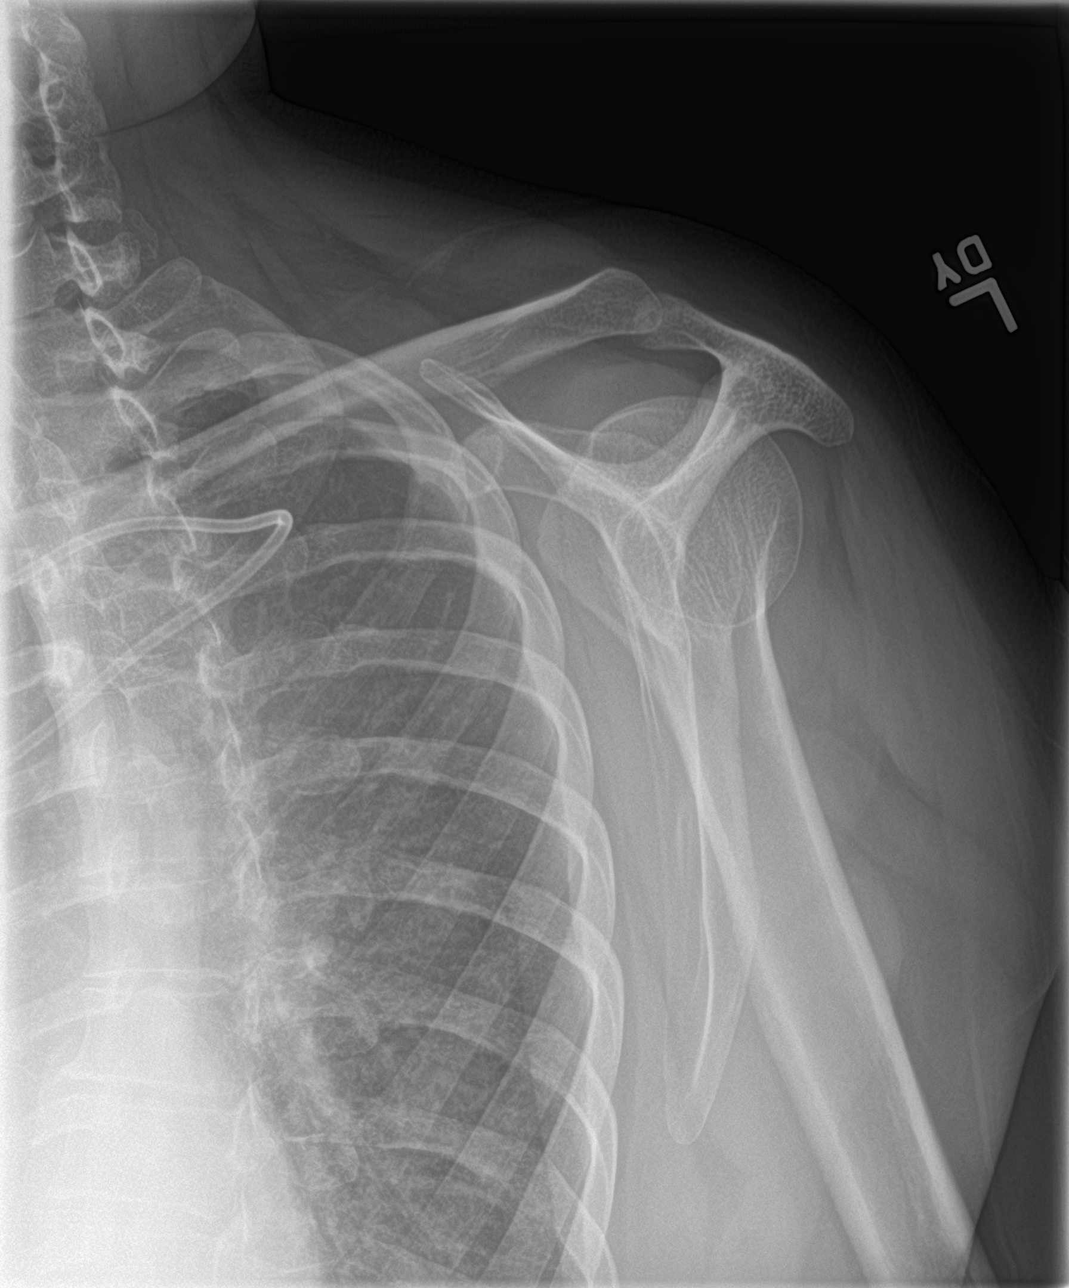
[im 3/3]
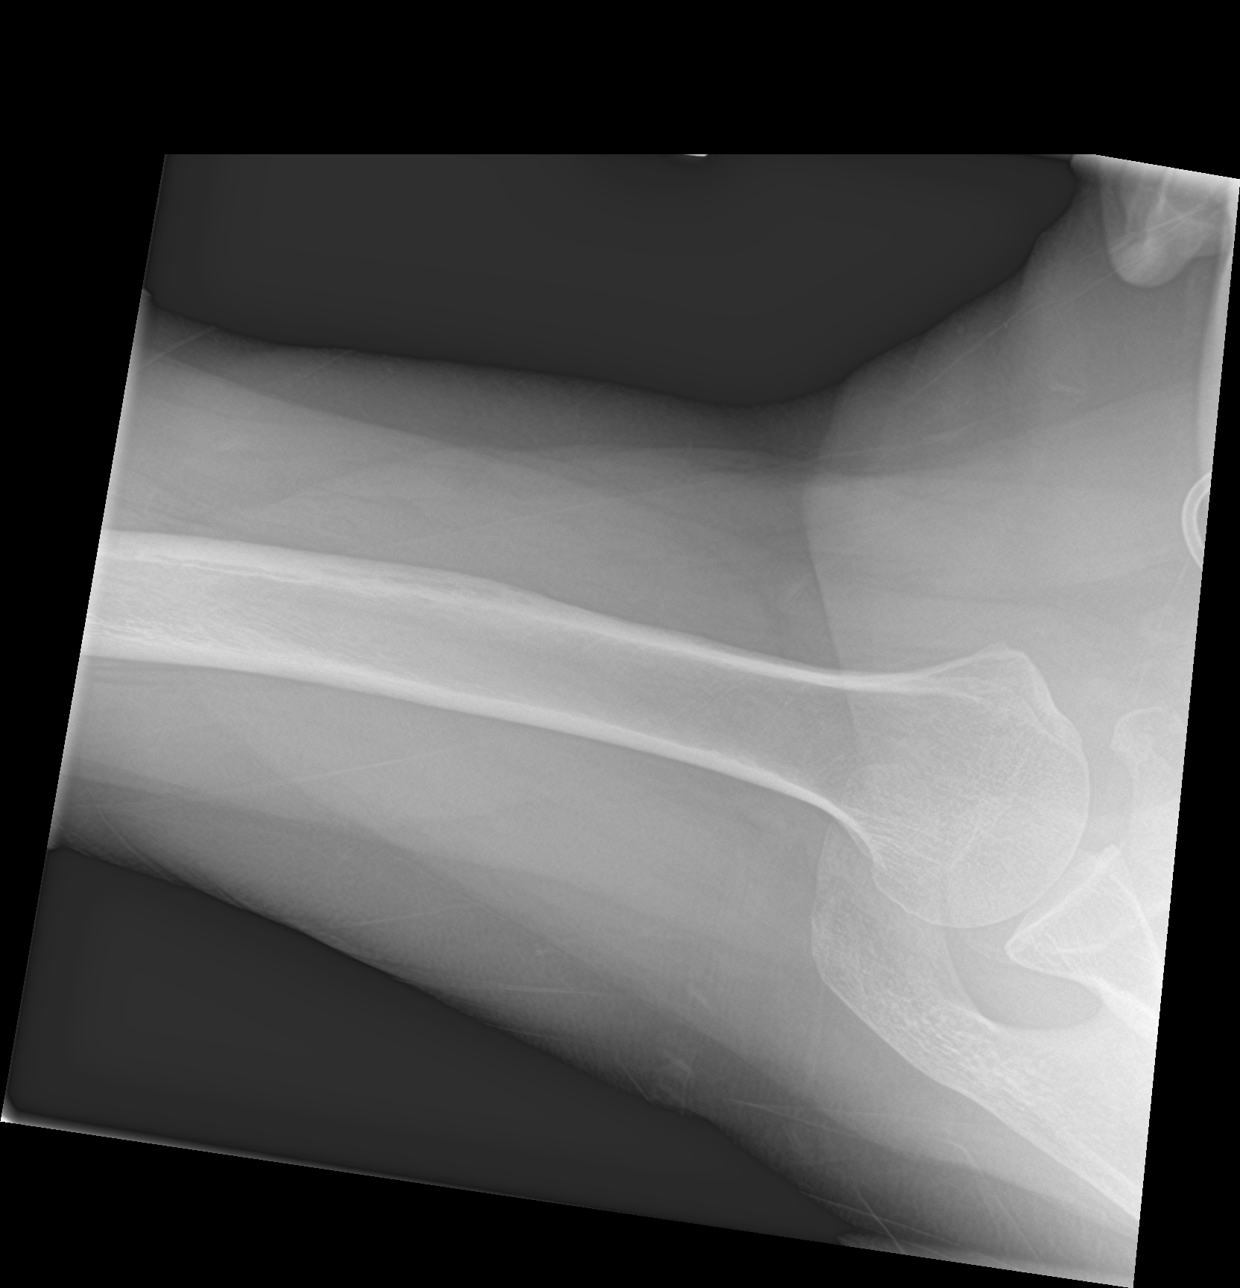

[3 of 3 positions shown; findings below may reference images not displayed]

FINDINGS: There is no evidence of fracture or dislocation. There is no
evidence of arthropathy or other focal bone abnormality. Soft
tissues are unremarkable.
IMPRESSION: Negative.

## 2018-10-19 IMAGING — CR DG KNEE COMPLETE 4+V*R*
1 series · 4 of 4 positions shown · non-contrast
Comparison: None.

CLINICAL DATA: No known injury, hx of right breast CA, chemo
finished [XN], radiation finished 10-19. Pain bilat shoulders and
knees when moving. No swelling, no bruising, no deformity.

EXAM:
RIGHT KNEE - COMPLETE 4+ VIEW

[Series 1: dg knee complete 4 views right · 0.14mm/px · 4 of 4 slices shown]
[im 1/4]
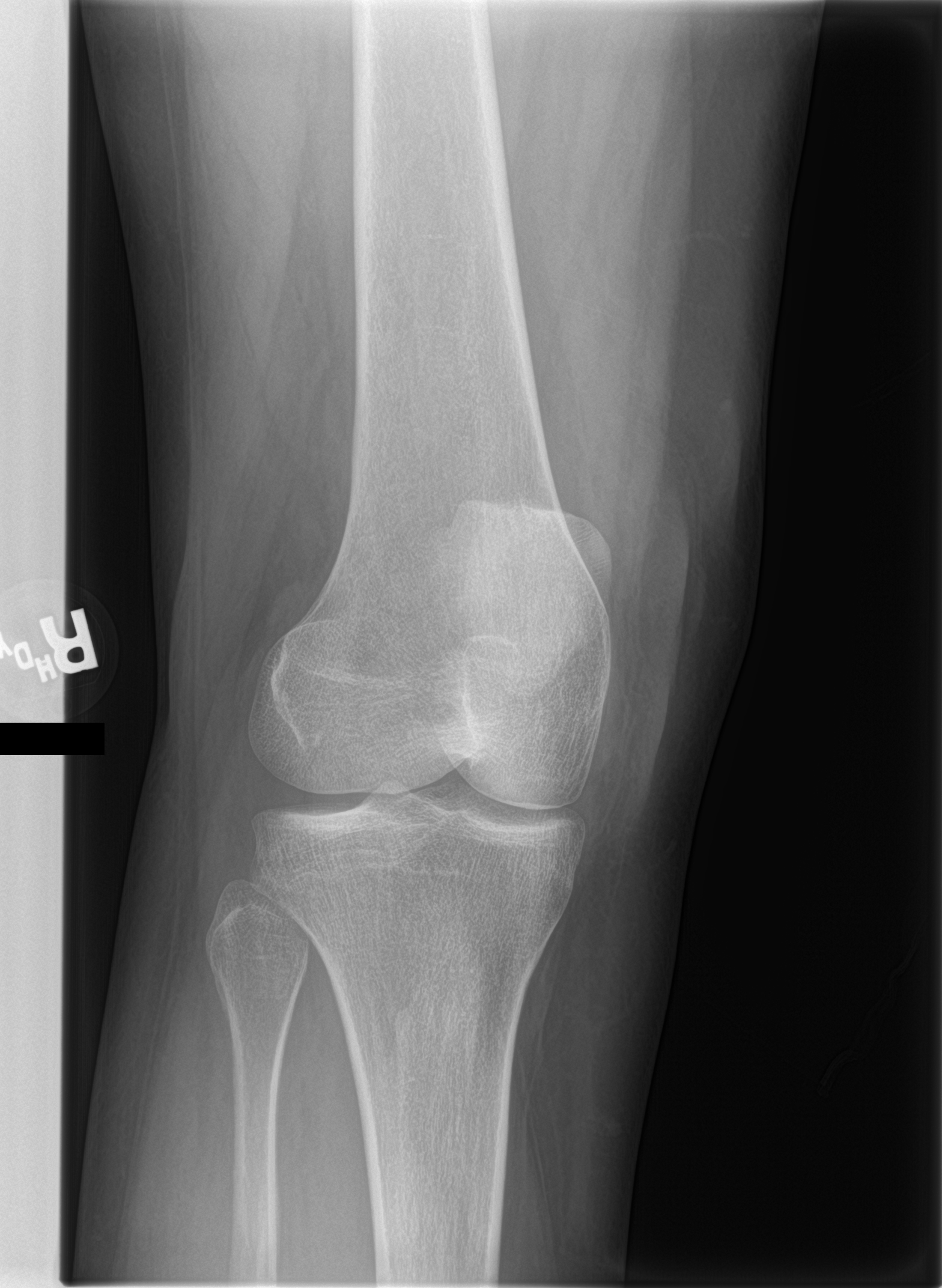
[im 2/4]
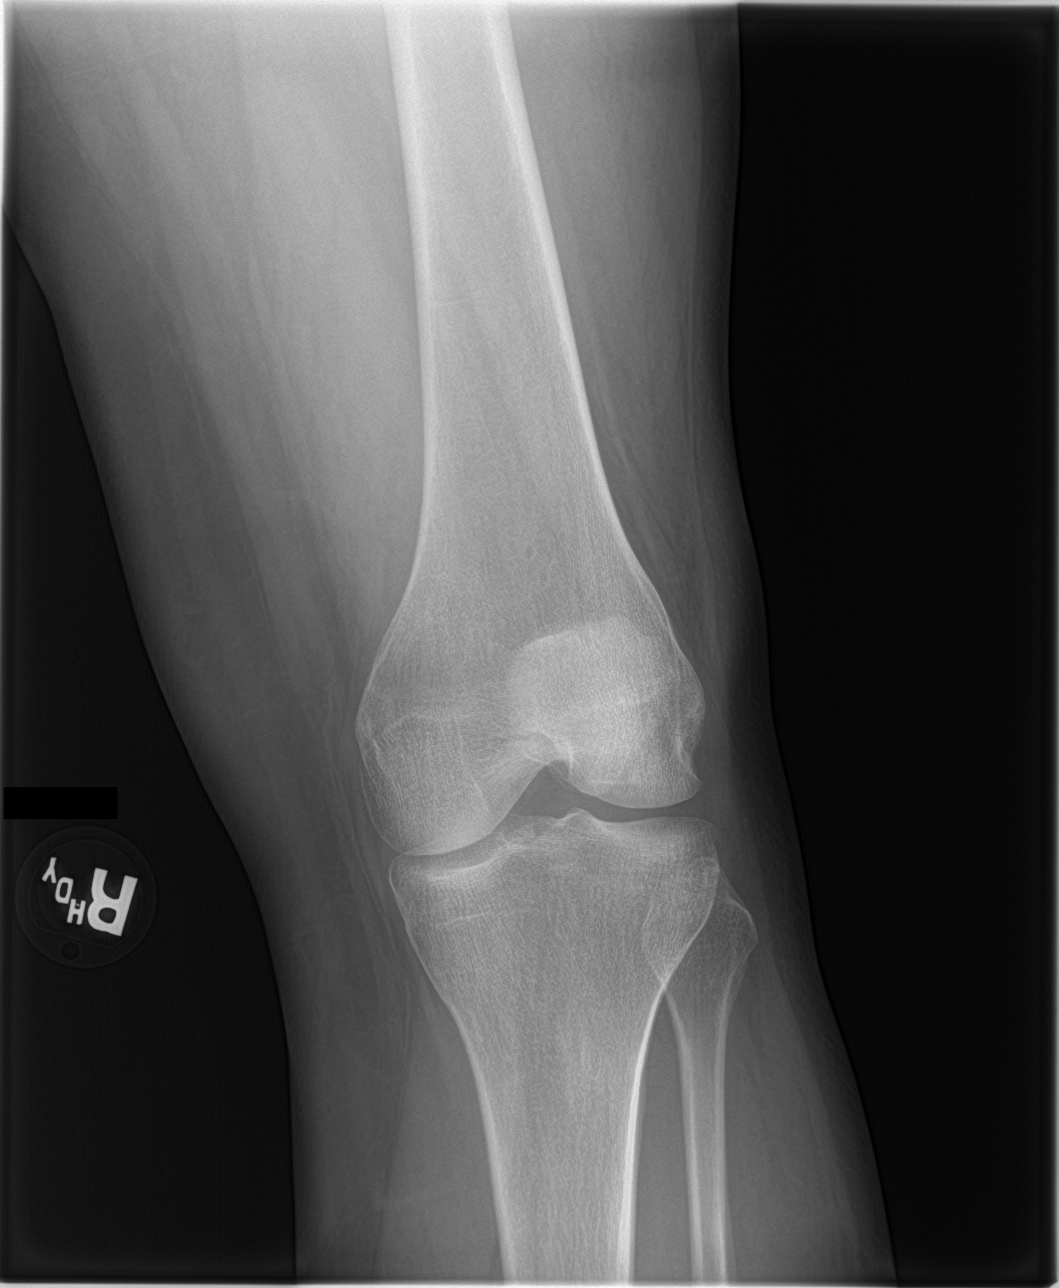
[im 3/4]
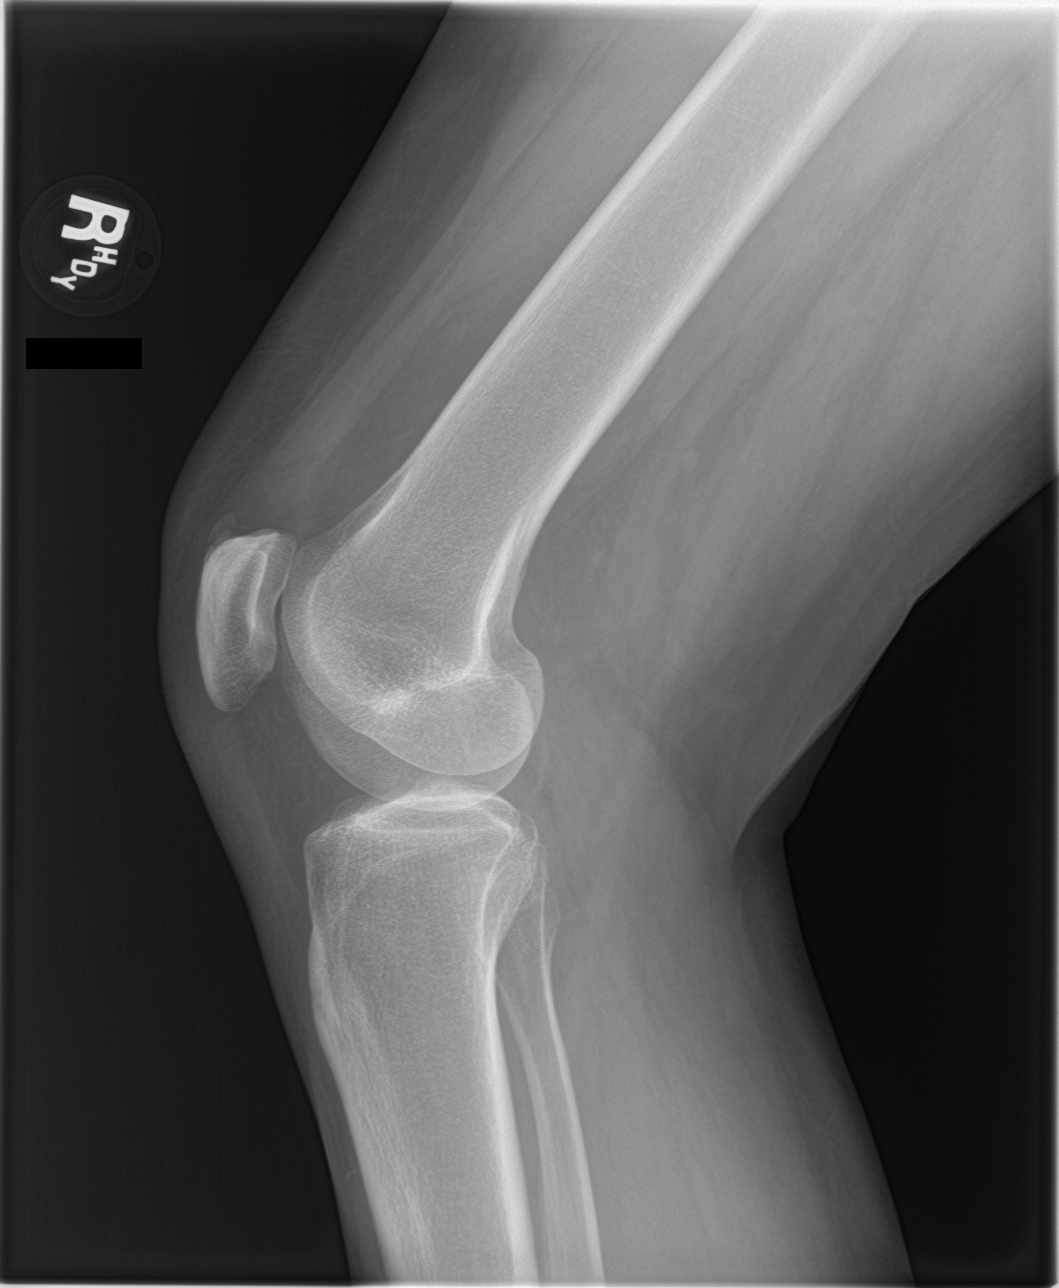
[im 4/4]
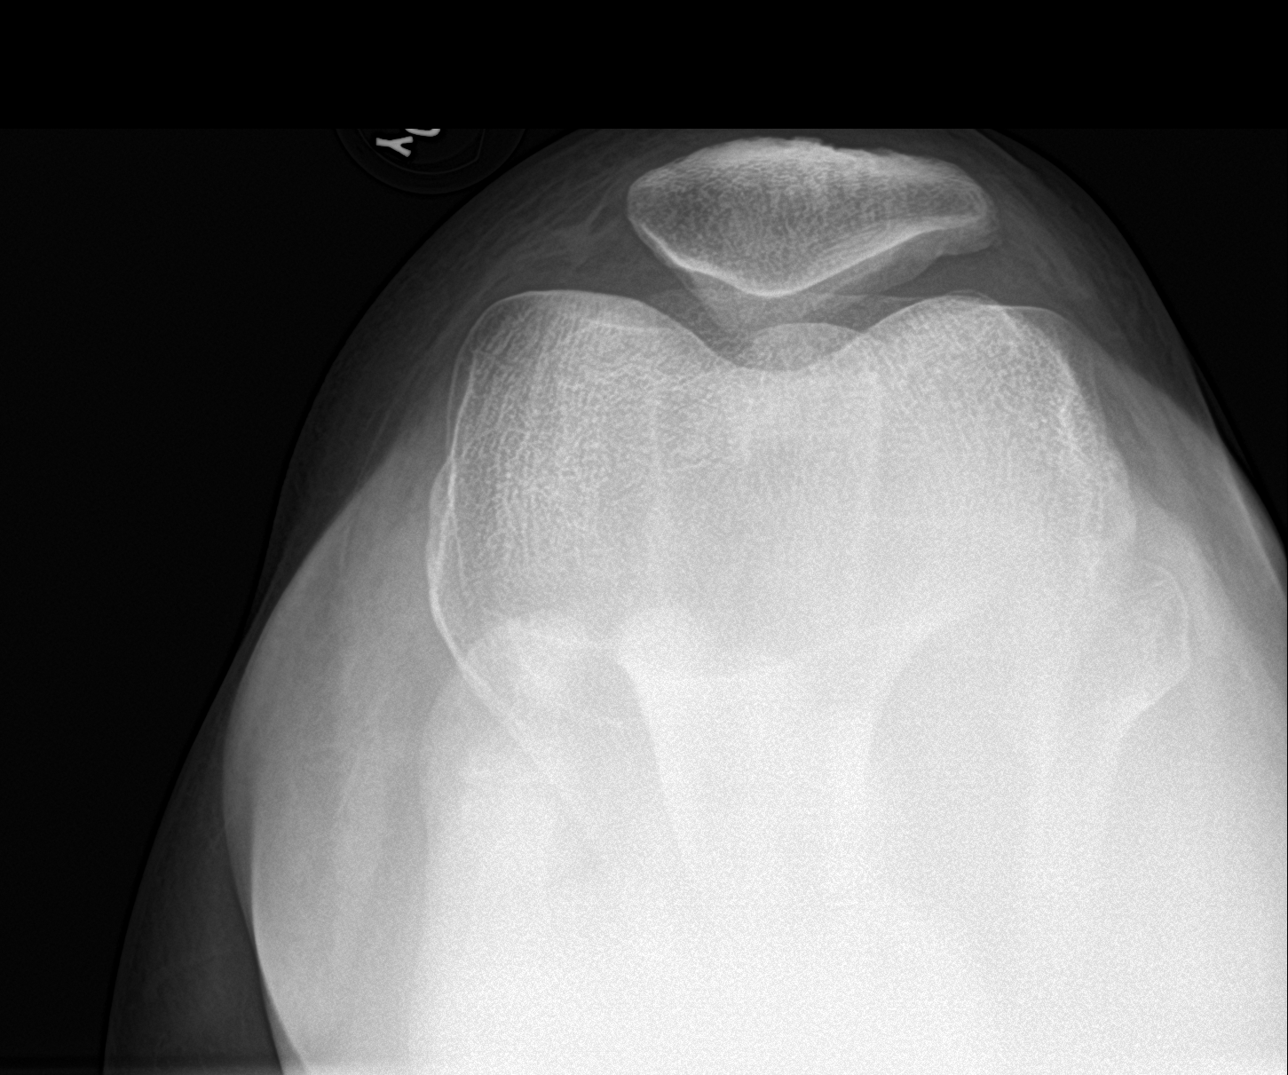

[4 of 4 positions shown; findings below may reference images not displayed]

FINDINGS: No evidence of fracture, dislocation, or joint effusion. No evidence
of arthropathy or other focal bone abnormality. Soft tissues are
unremarkable.
IMPRESSION: Negative.

## 2018-10-19 IMAGING — CR DG KNEE COMPLETE 4+V*L*
1 series · 4 of 4 positions shown · non-contrast
Comparison: None.

CLINICAL DATA: No known injury, hx of right breast CA, chemo
finished [NB], radiation finished 10-19. Pain bilat shoulders and
knees when moving. No swelling, no bruising, no deformity.

EXAM:
LEFT KNEE - COMPLETE 4+ VIEW

[Series 1: dg knee complete 4 views left · 0.14mm/px · 4 of 4 slices shown]
[im 1/4]
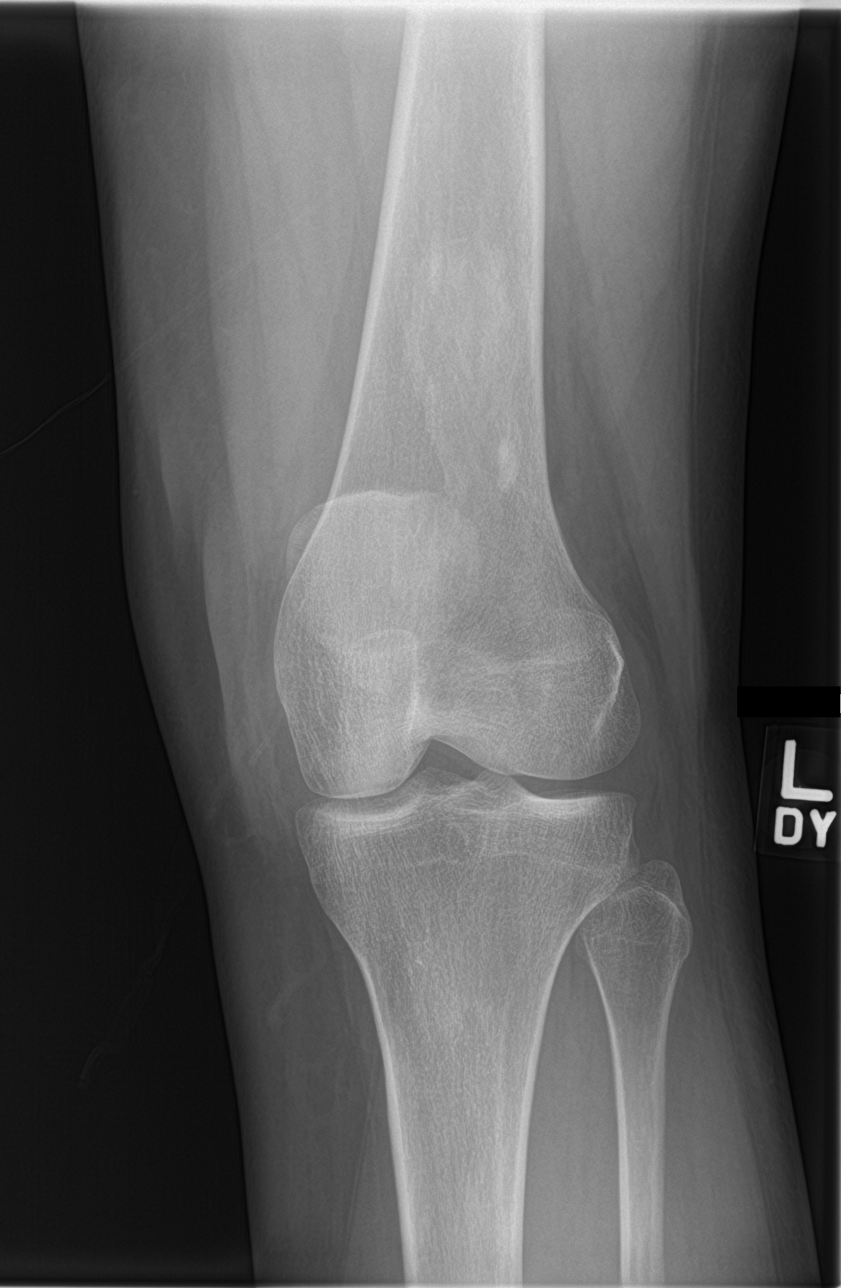
[im 2/4]
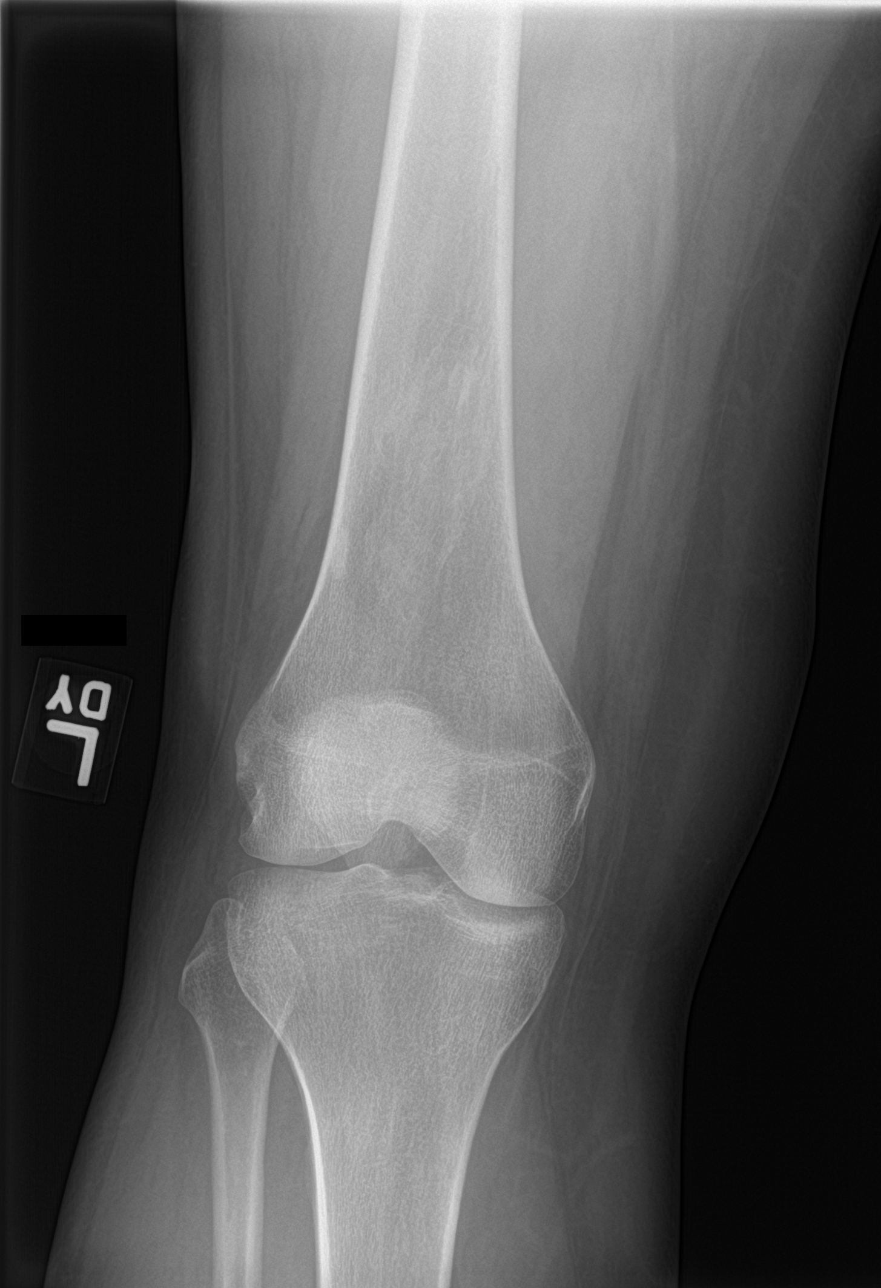
[im 3/4]
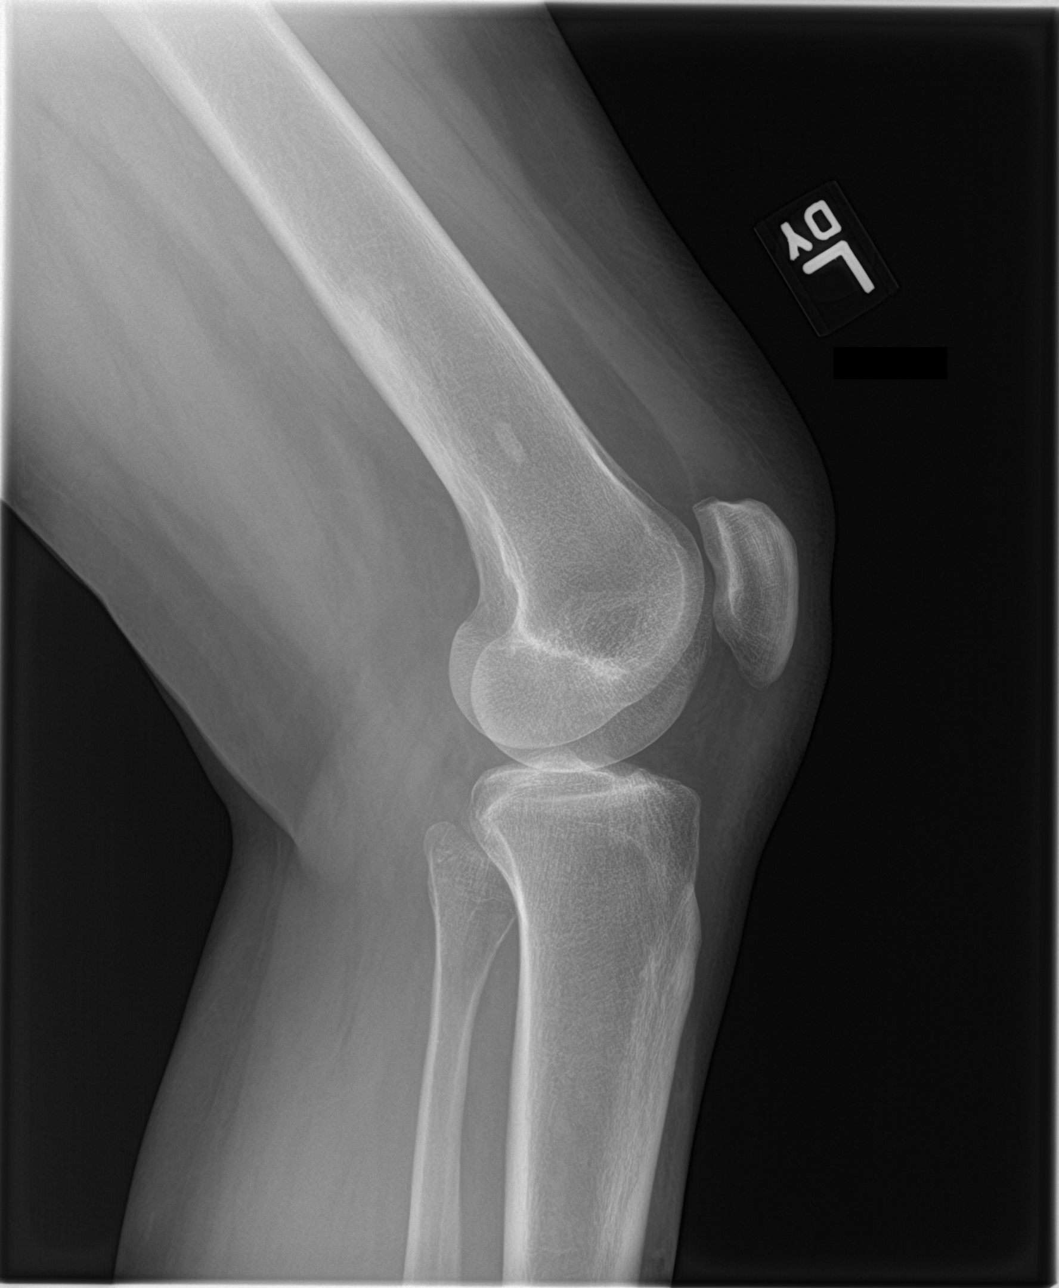
[im 4/4]
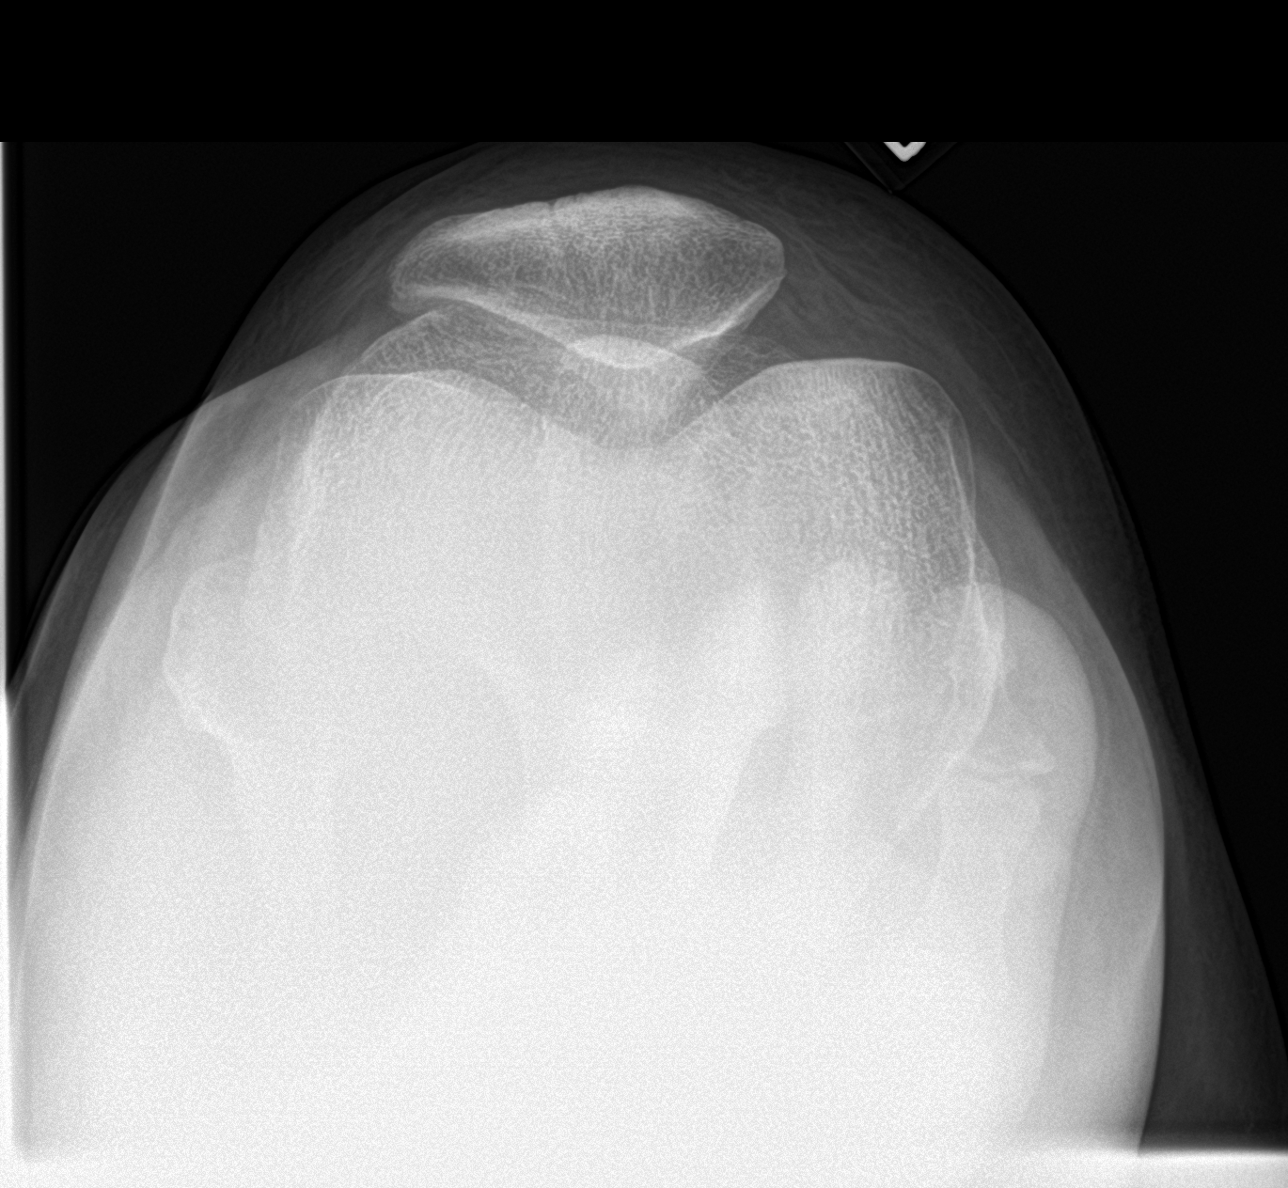

[4 of 4 positions shown; findings below may reference images not displayed]

FINDINGS: No fracture.

Knee joint normally spaced and aligned. No arthropathic changes. No
joint effusion.

Area of sclerosis is noted along the posterior aspect of the distal
left femoral shaft and adjacent metadiaphysis, extending from the
endosteal cortical margin. There is no bone resorption. The
juxtacortical position suggests that this is benign such as a healed
nonossifying fibroma. Sclerotic metastatic disease is not excluded.

Soft tissues are unremarkable
IMPRESSION: 1. No fracture or joint abnormality.
2. Sclerotic bone lesion along the posterior margin of the distal
left femur. Etiology is unclear. Consider follow-up whole body bone
scan to assess for possible metastatic disease.

## 2018-10-19 MED ORDER — HYDROCODONE-ACETAMINOPHEN 5-325 MG PO TABS: 1.0000 | ORAL_TABLET | Freq: Four times a day (QID) | ORAL | 0 refills | Status: DC | PRN

## 2018-10-19 MED ORDER — CYCLOBENZAPRINE HCL 5 MG PO TABS: 5.0000 mg | ORAL_TABLET | Freq: Three times a day (TID) | ORAL | 0 refills | Status: DC | PRN

## 2018-10-19 NOTE — Progress Notes (Signed)
Patient here for follow up. Pt states that she continues to have neuropathy pain and now joint pain. Left big toe has burning sensation.

## 2018-10-20 NOTE — Progress Notes (Signed)
Hematology/Oncology Follow Up Note [facility] [phone] [phone]  Patient Care Team: Marguerita Merles, MD as PCP - General (Family Medicine) End, Harrell Gave, MD as PCP - Cardiology (Cardiology)   Name of the patient: Theresa Chaney  [id number]  10/04/1981   Date of visit: 10/20/18 REASON FOR VISIT Follow up for Assessment prior to chemotherapy treatment of breast cancer  Oncology History 10/11/2017 Diagnosed with [id number]  Neoadjuvant dose dense AC and 1 cycle of Taxol.  Interim image showed no treatment response. 03/19/2018 right mastectomy and right axillary dissection, bilateral salpingo-oophorectomy. ypT3 ypN2 #Negative genetic testing #06/11/2018 Finish adjuvant weekly Taxol x11 Grade 2 neuropathy  #right immediate breast reconstruction with placement of tissue expanders.  Cancer TREATMENT Neoadjuvant ddAC +one dose of Taxol, due to lack of response, surgery was offered. Case was discussed on breast tumor board. 03/19/2018 S/p right mastectomy and right axillary dissection, immediate breast reconstruction with placement of expanders. Also had elective bilateral salpingo-oophorectomy..  # s/p 11 cycles Taxol adjuvantly. Tolerated well.  # She has obtained dental clearance for starting Zometa.  S/p Zometa on 6/3/ 2019 # s/p adjuvant radiation, finished 10/10/2018  INTERVAL HISTORY Patient presents for follow-up of management of breast cancer.  Patient has completed right breast reconstruction on 07/20/2018 and s/p adjuvant radiation, finished 10/10/2018.   # Continues to have persistent bilateral finger tip and feet neuropathy, gabapentin did not work. She is on Lyrica 306mdaily, which she feels working to some extent, but still not well controlled. Can't wear tennis shoes due to neuropathy.   # Also reports still having body aches/bone pain , especially neck, shoulders, and knees. Tried tramadol but tramadol causes headache so she  reports not using it.  # Bilateral upper extremity swelling, has establish care with lymphedema clinic.   #CHF: due to the extremity swelling, LVEF to 45%. She is on low dose metoprolol. Denies any SOB, chest pain.       Review of Systems  Constitutional: Positive for malaise/fatigue. Negative for chills, diaphoresis, fever and weight loss.  HENT: Negative for congestion, ear discharge, ear pain, hearing loss, nosebleeds, sinus pain, sore throat and tinnitus.   Eyes: Negative for blurred vision, double vision, photophobia, pain, discharge and redness.  Respiratory: Negative for cough, hemoptysis, sputum production, shortness of breath and wheezing.   Cardiovascular: Negative for chest pain, palpitations, orthopnea, claudication and leg swelling.  Gastrointestinal: Negative for abdominal pain, blood in stool, constipation, diarrhea, heartburn, melena, nausea and vomiting.  Genitourinary: Negative for dysuria, flank pain, frequency, hematuria and urgency.  Musculoskeletal: Positive for joint pain and myalgias. Negative for back pain and neck pain.       Hand swelling  Skin: Negative for itching and rash.  Neurological: Positive for tingling. Negative for dizziness, tremors, speech change, focal weakness, weakness and headaches.        tingling numbness burning sensation of bilateral hands and toes.    Endo/Heme/Allergies: Negative for environmental allergies and polydipsia. Does not bruise/bleed easily.  Psychiatric/Behavioral: Negative for depression, hallucinations and substance abuse. The patient is not nervous/anxious.     No Known Allergies  Patient Active Problem List   Diagnosis Date Noted  . Shortness of breath 08/23/2018  . Cardiomyopathy ([facility]/11/2018  . Preprocedural cardiovascular examination 08/23/2018  . Tachycardia 08/23/2018  . Palpitations 08/23/2018  . Estrogen receptor positive status (ER+) 04/04/2018  . Breast cancer of upper-outer quadrant of right female  breast ([facility]/07/2018  . Family history of cancer   .  Malignant neoplasm of overlapping sites of right breast in female, estrogen receptor positive ([place]) 10/19/2017     Past Medical History:  Diagnosis Date  . Anemia   . BRCA negative 11/26/2017  . Breast cancer ([place]) 10/11/2017   Multifocal, ER positive, PR negative, HER-2 negative.  . Cardiomyopathy ([place])    a. 10/2017 Echo: EF 60-65%, no rwma, Gr1 DD, nl RV size/fxn; b. 04/2018 Echo: EF 55-60%, no rwma, Nl RV size/fxn; c. 08/2018 Echo: EF 45%, diff HK, ? HK of antsept wall. Gr1 DD. Mild MR. Mild LAE/RAE. Mod dil RV.   [name] Chronic bronchitis ([place]) 11/2017  . COPD (chronic obstructive pulmonary disease) ([place])    MILD PER CXR  . Depression   . Family history of cancer   . GERD (gastroesophageal reflux disease)   . Headache    MIGRAINES  . Heart murmur    ASYMPTOMATIC  . Personal history of chemotherapy    current for right breast ca     Past Surgical History:  Procedure Laterality Date  . AXILLARY LYMPH NODE DISSECTION Right 03/19/2018   Procedure: AXILLARY LYMPH NODE DISSECTION;  Surgeon: Robert Bellow, MD;  Location: [facility] ORS;  Service: General;  Laterality: Right;  . BREAST BIOPSY Right 10/11/2017   12:30 posterior coil clip invasive mammary carcinoma  . BREAST BIOPSY Right 10/11/2017   11:30 middle depth ribbon clip DCIS  . BREAST BIOPSY Right 10/11/2017   5:30 anterior depth x shape invasive ductal carcinoma  . BREAST RECONSTRUCTION WITH PLACEMENT OF TISSUE EXPANDER AND FLEX HD (ACELLULAR HYDRATED DERMIS) Right 03/19/2018   Procedure: BREAST RECONSTRUCTION WITH PLACEMENT OF TISSUE EXPANDER AND FLEX HD (ACELLULAR HYDRATED DERMIS);  Surgeon: Wallace Going, DO;  Location: [facility] ORS;  Service: Plastics;  Laterality: Right;  . LAPAROSCOPIC BILATERAL SALPINGO OOPHERECTOMY Bilateral 03/19/2018   Procedure: LAPAROSCOPIC BILATERAL SALPINGO OOPHORECTOMY;  Surgeon: Benjaman Kindler, MD;  Location: [facility] ORS;  Service:  Gynecology;  Laterality: Bilateral;  . MASTECTOMY Right 03/2018  . MASTECTOMY W/ SENTINEL NODE BIOPSY Right 03/19/2018   Procedure: MASTECTOMY WITH SENTINEL LYMPH NODE BIOPSY;  Surgeon: Robert Bellow, MD;  Location: [facility] OR[facility];  Service: General;  Laterality: Right;  . PORTACATH PLACEMENT Left 10/24/2017   Procedure: INSERTION PORT-A-CATH;  Surgeon: Robert Bellow, MD;  Location: [facility] OR[facility];  Service: General;  Laterality: Left;  . REMOVAL OF TISSUE EXPANDER AND PLACEMENT OF IMPLANT Right 07/20/2018   Procedure: REMOVAL OF RIGHT BREAST TISSUE EXPANDER AND PLACEMENT OF IMPLANT;  Surgeon: Wallace Going, DO;  Location: [facility];  Service: Plastics;  Laterality: Right;    Social History   Socioeconomic History  . Marital status: Married    Spouse name: Not on file  . Number of children: Not on file  . Years of education: Not on file  . Highest education level: Not on file  Occupational History  . Occupation: Occupational psychologist    Comment: [employer]  . Financial resource strain: Not on file  . Food insecurity:    Worry: Not on file    Inability: Not on file  . Transportation needs:    Medical: Not on file    Non-medical: Not on file  Tobacco Use  . Smoking status: Former Smoker    Packs/day: 0.50    Years: 18.00    Pack years: 9.00    Types: Cigarettes    Start date: 06/21/2018  . Smokeless tobacco: Never Used  Substance and Sexual  Activity  . Alcohol use: No    Frequency: Never  . Drug use: No  . Sexual activity: Yes    Birth control/protection: Injection  Lifestyle  . Physical activity:    Days per week: Not on file    Minutes per session: Not on file  . Stress: Not on file  Relationships  . Social connections:    Talks on phone: Not on file    Gets together: Not on file    Attends religious service: Not on file    Active member of club or organization: Not on file    Attends meetings of clubs or  organizations: Not on file    Relationship status: Not on file  . Intimate partner violence:    Fear of current or ex partner: Not on file    Emotionally abused: Not on file    Physically abused: Not on file    Forced sexual activity: Not on file  Other Topics Concern  . Not on file  Social History Narrative  . Not on file     Family History  Problem Relation Age of Onset  . Melanoma Maternal Aunt        other aunts with BCC/SCC/Melanoma  . Diabetes Father   . Hypertension Father   . Hyperlipidemia Father   . Heart attack Father 42       "mild"  . Bladder Cancer Maternal Grandmother   . Cervical cancer Maternal Aunt 64       daughter w/ cervical cancer as well  . Melanoma Maternal Uncle        other uncles with BCC/SCC/Melanoma     Current Outpatient Medications:  .  albuterol (PROVENTIL HFA;VENTOLIN HFA) 108 (90 Base) MCG/ACT inhaler, Inhale 2 puffs into the lungs every 6 (six) hours as needed for wheezing or shortness of breath (Cough)., Disp: 1 Inhaler, Rfl: 2 .  ALPRAZolam (XANAX) 0.25 MG tablet, Take 0.25 mg at bedtime as needed by mouth for anxiety or sleep. , Disp: , Rfl:  .  Calcium Carb-Cholecalciferol (CALCIUM 600 + D PO), Take 1 tablet daily by mouth., Disp: , Rfl:  .  chlorhexidine (PERIDEX) 0.12 % solution, USE AS DIRECTED 15MLS IN THE MOUTH OR THROAT TWICE A DAY, Disp: 473 mL, Rfl: 1 .  doxylamine, Sleep, (UNISOM) 25 MG tablet, Take 25 mg by mouth at bedtime as needed for sleep., Disp: , Rfl:  .  escitalopram (LEXAPRO) 20 MG tablet, Take 20 mg by mouth every morning. , Disp: , Rfl:  .  esomeprazole (NEXIUM) 40 MG capsule, Take 40 mg by mouth every morning. , Disp: , Rfl:  .  ibuprofen (ADVIL,MOTRIN) 800 MG tablet, Take 1 tablet (800 mg total) by mouth every 8 (eight) hours as needed for moderate pain., Disp: 30 tablet, Rfl: 1 .  letrozole (FEMARA) 2.5 MG tablet, Take 1 tablet (2.5 mg total) by mouth daily., Disp: 90 tablet, Rfl: 0 .  loratadine (CLARITIN) 10 MG  tablet, Take 10 mg by mouth daily. , Disp: , Rfl:  .  LORazepam (ATIVAN) 1 MG tablet, Take 1 mg by mouth every 8 (eight) hours., Disp: , Rfl:  .  metoprolol succinate (TOPROL XL) 25 MG 24 hr tablet, Take 0.5 tablets (12.5 mg total) by mouth daily., Disp: 45 tablet, Rfl: 3 .  pregabalin (LYRICA) 300 MG capsule, Take 1 capsule (300 mg total) by mouth daily., Disp: 30 capsule, Rfl: 0 .  prochlorperazine (COMPAZINE) 10 MG tablet, Take 1 tablet (10 mg total)  by mouth every 6 (six) hours as needed (Nausea or vomiting)., Disp: 30 tablet, Rfl: 1 .  promethazine (PHENERGAN) 25 MG tablet, Take 1 tablet (25 mg total) by mouth every 6 (six) hours as needed for nausea or vomiting., Disp: 30 tablet, Rfl: 0 .  pyridOXINE (VITAMIN B-6) 100 MG tablet, Take 1 tablet (100 mg total) by mouth daily., Disp: 30 tablet, Rfl: 1 .  vitamin C (ASCORBIC ACID) 500 MG tablet, Take 500 mg by mouth daily., Disp: , Rfl:  .  cyclobenzaprine (FLEXERIL) 5 MG tablet, Take 1 tablet (5 mg total) by mouth 3 (three) times daily as needed for muscle spasms., Disp: 90 tablet, Rfl: 0 .  HYDROcodone-acetaminophen (NORCO) 5-325 MG tablet, Take 1 tablet by mouth every 6 (six) hours as needed for moderate pain., Disp: 30 tablet, Rfl: 0 No current facility-administered medications for this visit.   Facility-Administered Medications Ordered in Other Visits:  .  heparin lock flush 100 unit/mL, 500 Units, Intravenous, Once, Earlie Server, MD   Physical exam:  Vitals:   10/19/18 1337  BP: 117/74  Pulse: 80  Resp: 18  Temp: (!) 96.8 F (36 C)  TempSrc: Tympanic  Weight: 189 lb 9.6 oz (86 kg)  ECOG 1 Physical Exam  Constitutional: She is oriented to person, place, and time. No distress.  HENT:  Head: Normocephalic and atraumatic.  Mouth/Throat: No oropharyngeal exudate.  No thrush,  Eyes: Pupils are equal, round, and reactive to light. Conjunctivae and EOM are normal. Left eye exhibits no discharge. No scleral icterus.  Neck: Normal range of  motion. Neck supple. No JVD present.  Cardiovascular: Normal rate, regular rhythm and normal heart sounds.  No murmur heard. Pulmonary/Chest: Effort normal and breath sounds normal. No respiratory distress. She has no wheezes. She has no rales. She exhibits no tenderness.  Abdominal: Soft. Bowel sounds are normal. She exhibits no distension and no mass. There is no tenderness. There is no rebound.  Musculoskeletal: Normal range of motion. She exhibits no edema.  Bilateral hand swelling.   Lymphadenopathy:    She has no cervical adenopathy.  Neurological: She is alert and oriented to person, place, and time. No cranial nerve deficit. She exhibits normal muscle tone. Coordination normal.  Skin: Skin is warm and dry. No rash noted. She is not diaphoretic. No erythema.  Psychiatric: Affect normal.       CMP Latest Ref Rng & Units 09/12/2018  Glucose 70 - 99 mg/dL 109(H)  BUN 6 - 20 mg/dL 16  Creatinine 0.44 - 1.00 mg/dL 0.49  Sodium 135 - 145 mmol/L 140  Potassium 3.5 - 5.1 mmol/L 4.0  Chloride 98 - 111 mmol/L 103  CO2 22 - 32 mmol/L 28  Calcium 8.9 - 10.3 mg/dL 9.8  Total Protein 6.5 - 8.1 g/dL 7.5  Total Bilirubin 0.3 - 1.2 mg/dL 0.3  Alkaline Phos 38 - 126 U/L 65  AST 15 - 41 U/L 23  ALT 0 - 44 U/L 18   CBC Latest Ref Rng & Units 10/19/2018  WBC 4.0 - 10.5 K/uL 6.5  Hemoglobin 12.0 - 15.0 g/dL 11.5(L)  Hematocrit 36.0 - 46.0 % 35.1(L)  Platelets 150 - 400 K/uL 249   RADIOGRAPHIC STUDIES: I have personally reviewed the radiological images as listed and agreed with the findings in the report. 08/23/2018 DEXA The BMD measured at Femur Neck Right is 0.900 g/cm2 with a T-score of -1.0. This patient is considered normal according to [facility]) criteria. Site Region Measured Measured  WHO Young Adult BMD Date       Age      Classification T-score AP Spine L1-L4 08/23/2018 35.5 Normal -0.8 1.091 g/cm2 DualFemur Neck Right 08/23/2018 35.5 Normal -1.0 0.900  g/cm2  08/15/2018 2D echo Study Conclusions - Left ventricle: The cavity size was mildly dilated. Systolic   function was mildly reduced. The estimated ejection fraction was   45%. Diffuse hypokinesis. Hypokinesis of the anteroseptal   myocardium. Doppler parameters are consistent with abnormal left   ventricular relaxation (grade 1 diastolic dysfunction). - Aortic valve: Valve area (VTI): 1.88 cm^2. Valve area (Vmax): 1.8   cm^2. Valve area (Vmean): 1.74 cm^2. - Mitral valve: There was mild regurgitation. Valve area by   continuity equation (using LVOT flow): 2.22 cm^2. - Left atrium: The atrium was mildly dilated. - Right ventricle: The cavity size was moderately dilated. - Right atrium: The atrium was mildly dilated.   Assessment and plan- Patient is a 37 y.o. female presents for evaluation of newly diagnosed multicentric right breast cancer.  Cancer Staging Breast cancer of upper-outer quadrant of right female breast [facility]) Staging form: Breast, AJCC 8th Edition - Clinical: G2, ER+, PR+, HER2- - Signed by Earlie Server, MD on 04/05/2018 - Pathologic stage from 04/04/2018: No Stage Recommended (ypT3, pN2, cM0, G3, ER+, PR-, HER2-) - Signed by Earlie Server, MD on 04/04/2018 - Pathologic: No stage assigned - Unsigned     1. Malignant neoplasm of overlapping sites of right breast in female, estrogen receptor positive ([place])   2. Pain of both shoulder joints   3. Pain in both knees, unspecified chronicity   4. Chronic narcotic use   5. Neuropathy due to chemotherapeutic drug ([place])   6. Acute systolic congestive heart failure ([place])   7. Menopause   8. Bilateral hand swelling   9. Aromatase inhibitor use     #Breast cancer, finished adjuvant RT.  Labs reviewed. Mild anemia due to RT.  She was off letrozole during RT, just started back on letrozole for a week.   # Chemotherapy induced neuropathy, continue Lyrica 366m daily. Refer to acupuncture   # Bilateral hand swelling, ?lymphedema. It  is not typical that she also developed edema on the non surgery side.  # Generalized body aches and joint pain.  Check X ray shoulder and knees.   vasomotor symptoms due to menopause vs skeletal metastatic disease.   Reports tramadol not working, I prescribed her a course of Norco 5/3222mQ6h as needed. Pain contract was signed.  Patient verbalized understanding that medications should not be sold or shared, taken with alcohol, or used while driving. Patient educated that medications should not be bitten, chewed, or crushed. patient has been made aware of the sside effects and treatment/ prevention  of constipation,  respiratory depression,  and mental status changes, even lead to overdose that may result in death, if used outside of the parameters that we discussed.     # Continue Calcium and vitamin D supplements.  Plan adjuvant bisphosphonate every 6 months for 2 years. Next dose due in December  # CHF: LVEF has decreased to 45% She has been evaluated by cardiology and will have additional work up done. On beta blocker   # Medi port flush every 8 weeks, scheduled till  February.   #Not eligible for NATALEE clinical trial due to CHF.   X ray bilateral Knees and shoulders independently reviewed by me..  Sclerotic bone lesion along the posterior margin of the distal left femur.  Obtain Bone scan.   Follow up in 2 weeks for assessement .  Orders Placed This Encounter  Procedures  . DG Shoulder Left    Standing Status:   Future    Number of Occurrences:   1    Standing Expiration Date:   12/20/2019    Order Specific Question:   Reason for Exam (SYMPTOM  OR DIAGNOSIS REQUIRED)    Answer:   pain    Order Specific Question:   Is patient pregnant?    Answer:   No    Order Specific Question:   Preferred imaging location?    Answer:   [facility]    Order Specific Question:   Radiology Contrast Protocol - do NOT remove file path    Answer:    \\[email or web address]\DXFluoroContrastProtocols.pdf  . DG Shoulder Right    Standing Status:   Future    Number of Occurrences:   1    Standing Expiration Date:   12/20/2019    Order Specific Question:   Reason for Exam (SYMPTOM  OR DIAGNOSIS REQUIRED)    Answer:   pain    Order Specific Question:   Is patient pregnant?    Answer:   No    Order Specific Question:   Preferred imaging location?    Answer:   [facility]    Order Specific Question:   Radiology Contrast Protocol - do NOT remove file path    Answer:   \\[email or web address]\Radiant\DXFluoroContrastProtocols.pdf  . [id number] 11+Oxyco+Alc+Crt-Bund    Standing Status:   Future    Standing Expiration Date:   10/20/2019  . Total face to face encounter time for this patient visit was 45 min. >50% of the time was  spent in counseling and coordination of care.   Earlie Server, MD, PhD Hematology Oncology [facility] Pager- [phone] 10/20/18

## 2018-10-22 ENCOUNTER — Telehealth: Payer: Self-pay | Admitting: *Deleted

## 2018-10-22 ENCOUNTER — Encounter: Payer: Self-pay | Admitting: *Deleted

## 2018-10-22 NOTE — Telephone Encounter (Signed)
Pharmacy notified.

## 2018-10-22 NOTE — Telephone Encounter (Signed)
Patient is expecting a prescription for Cymbalta to replace another of her antidepressants. Please order if agree

## 2018-10-22 NOTE — Telephone Encounter (Signed)
Per Dr Tasia Catchings, Cymbalta not being ordered at this time, patient to undergo more testing for sclerotic lesions

## 2018-10-23 ENCOUNTER — Ambulatory Visit (INDEPENDENT_AMBULATORY_CARE_PROVIDER_SITE_OTHER): Payer: BLUE CROSS/BLUE SHIELD | Admitting: General Surgery

## 2018-10-23 ENCOUNTER — Other Ambulatory Visit: Payer: Self-pay

## 2018-10-23 ENCOUNTER — Encounter: Payer: Self-pay | Admitting: General Surgery

## 2018-10-23 VITALS — BP 136/69 | HR 102 | Temp 97.2°F | Resp 12 | Ht 67.0 in | Wt 189.0 lb

## 2018-10-23 DIAGNOSIS — Z170 Estrogen receptor positive status [ER+]: Secondary | ICD-10-CM | POA: Diagnosis not present

## 2018-10-23 DIAGNOSIS — C50811 Malignant neoplasm of overlapping sites of right female breast: Secondary | ICD-10-CM

## 2018-10-23 NOTE — Progress Notes (Signed)
Patient ID: Theresa Chaney, female   DOB: 02-18-1981, 37 y.o.   MRN: [id number]  Chief Complaint  Patient presents with  . Follow-up    HPI Theresa Chaney is a 37 y.o. female who presents for her follow up right breast cancer and a breast evaluation. The most recent left mammogram was done on 10/05/2018. She states the right implant is hard and uncomfortable which started before radiation. Tolerating her letrozole( it was held for radiation).  Patient does perform regular self breast checks and gets regular mammograms done.    HPI  Past Medical History:  Diagnosis Date  . Anemia   . BRCA negative 11/26/2017  . Breast cancer ([place]) 10/11/2017   Multifocal, ER positive, PR negative, HER-2 negative. ypT3 ypN2a 8.7 cm, 4/15 nodes  . Cardiomyopathy ([place])    a. 10/2017 Echo: EF 60-65%, no rwma, Gr1 DD, nl RV size/fxn; b. 04/2018 Echo: EF 55-60%, no rwma, Nl RV size/fxn; c. 08/2018 Echo: EF 45%, diff HK, ? HK of antsept wall. Gr1 DD. Mild MR. Mild LAE/RAE. Mod dil RV.   [name] Chronic bronchitis ([place]) 11/2017  . COPD (chronic obstructive pulmonary disease) ([place])    MILD PER CXR  . Depression   . Family history of cancer   . GERD (gastroesophageal reflux disease)   . Headache    MIGRAINES  . Heart murmur    ASYMPTOMATIC  . Personal history of chemotherapy    current for right breast ca    Past Surgical History:  Procedure Laterality Date  . AXILLARY LYMPH NODE DISSECTION Right 03/19/2018   Procedure: AXILLARY LYMPH NODE DISSECTION;  Surgeon: Robert Bellow, MD;  Location: [facility];  Service: General;  Laterality: Right;  . BREAST BIOPSY Right 10/11/2017   12:30 posterior coil clip invasive mammary carcinoma  . BREAST BIOPSY Right 10/11/2017   11:30 middle depth ribbon clip DCIS  . BREAST BIOPSY Right 10/11/2017   5:30 anterior depth x shape invasive ductal carcinoma  . BREAST RECONSTRUCTION WITH PLACEMENT OF TISSUE EXPANDER AND FLEX HD (ACELLULAR HYDRATED DERMIS) Right 03/19/2018   Procedure:  BREAST RECONSTRUCTION WITH PLACEMENT OF TISSUE EXPANDER AND FLEX HD (ACELLULAR HYDRATED DERMIS);  Surgeon: Wallace Going, DO;  Location: [facility];  Service: Plastics;  Laterality: Right;  . LAPAROSCOPIC BILATERAL SALPINGO OOPHERECTOMY Bilateral 03/19/2018   Procedure: LAPAROSCOPIC BILATERAL SALPINGO OOPHORECTOMY;  Surgeon: Benjaman Kindler, MD;  Location: [facility];  Service: Gynecology;  Laterality: Bilateral;  . MASTECTOMY Right 03/2018  . MASTECTOMY W/ SENTINEL NODE BIOPSY Right 03/19/2018   Procedure: MASTECTOMY WITH SENTINEL LYMPH NODE BIOPSY;  Surgeon: Robert Bellow, MD;  Location: [facility];  Service: General;  Laterality: Right;  . PORTACATH PLACEMENT Left 10/24/2017   Procedure: INSERTION PORT-A-CATH;  Surgeon: Robert Bellow, MD;  Location: [facility];  Service: General;  Laterality: Left;  . REMOVAL OF TISSUE EXPANDER AND PLACEMENT OF IMPLANT Right 07/20/2018   Procedure: REMOVAL OF RIGHT BREAST TISSUE EXPANDER AND PLACEMENT OF IMPLANT;  Surgeon: Wallace Going, DO;  Location: [facility];  Service: Plastics;  Laterality: Right;    Family History  Problem Relation Age of Onset  . Melanoma Maternal Aunt        other aunts with BCC/SCC/Melanoma  . Diabetes Father   . Hypertension Father   . Hyperlipidemia Father   . Heart attack Father 65       "mild"  . Bladder Cancer Maternal Grandmother   . Cervical cancer Maternal Aunt 83  daughter w/ cervical cancer as well  . Melanoma Maternal Uncle        other uncles with BCC/SCC/Melanoma    Social History Social History   Tobacco Use  . Smoking status: Former Smoker    Packs/day: 0.50    Years: 18.00    Pack years: 9.00    Types: Cigarettes    Start date: 06/21/2018  . Smokeless tobacco: Never Used  Substance Use Topics  . Alcohol use: No    Frequency: Never  . Drug use: No    No Known Allergies  Current Outpatient Medications  Medication Sig Dispense Refill  . albuterol (PROVENTIL  HFA;VENTOLIN HFA) 108 (90 Base) MCG/ACT inhaler Inhale 2 puffs into the lungs every 6 (six) hours as needed for wheezing or shortness of breath (Cough). 1 Inhaler 2  . ALPRAZolam (XANAX) 0.25 MG tablet Take 0.25 mg at bedtime as needed by mouth for anxiety or sleep.     . Calcium Carb-Cholecalciferol (CALCIUM 600 + D PO) Take 1 tablet daily by mouth.    . chlorhexidine (PERIDEX) 0.12 % solution USE AS DIRECTED 15MLS IN THE MOUTH OR THROAT TWICE A DAY 473 mL 1  . cyclobenzaprine (FLEXERIL) 5 MG tablet Take 1 tablet (5 mg total) by mouth 3 (three) times daily as needed for muscle spasms. 90 tablet 0  . doxylamine, Sleep, (UNISOM) 25 MG tablet Take 25 mg by mouth at bedtime as needed for sleep.    [name] escitalopram (LEXAPRO) 20 MG tablet Take 20 mg by mouth every morning.     [name] esomeprazole (NEXIUM) 40 MG capsule Take 40 mg by mouth every morning.     [name] HYDROcodone-acetaminophen (NORCO) 5-325 MG tablet Take 1 tablet by mouth every 6 (six) hours as needed for moderate pain. 30 tablet 0  . ibuprofen (ADVIL,MOTRIN) 800 MG tablet Take 1 tablet (800 mg total) by mouth every 8 (eight) hours as needed for moderate pain. 30 tablet 1  . letrozole (FEMARA) 2.5 MG tablet Take 1 tablet (2.5 mg total) by mouth daily. 90 tablet 0  . loratadine (CLARITIN) 10 MG tablet Take 10 mg by mouth daily.     [name] LORazepam (ATIVAN) 1 MG tablet Take 1 mg by mouth every 8 (eight) hours.    . metoprolol succinate (TOPROL XL) 25 MG 24 hr tablet Take 0.5 tablets (12.5 mg total) by mouth daily. 45 tablet 3  . pregabalin (LYRICA) 300 MG capsule Take 1 capsule (300 mg total) by mouth daily. 30 capsule 0  . prochlorperazine (COMPAZINE) 10 MG tablet Take 1 tablet (10 mg total) by mouth every 6 (six) hours as needed (Nausea or vomiting). 30 tablet 1  . promethazine (PHENERGAN) 25 MG tablet Take 1 tablet (25 mg total) by mouth every 6 (six) hours as needed for nausea or vomiting. 30 tablet 0  . pyridOXINE (VITAMIN B-6) 100 MG tablet Take 1  tablet (100 mg total) by mouth daily. 30 tablet 1  . vitamin C (ASCORBIC ACID) 500 MG tablet Take 500 mg by mouth daily.     No current facility-administered medications for this visit.    Facility-Administered Medications Ordered in Other Visits  Medication Dose Route Frequency Provider Last Rate Last Dose  . heparin lock flush 100 unit/mL  500 Units Intravenous Once Earlie Server, MD        Review of Systems Review of Systems  Constitutional: Negative.   Respiratory: Negative.   Cardiovascular: Negative.     Blood pressure 136/69, pulse (!) 102,  temperature (!) 97.2 F (36.2 C), temperature source Skin, resp. rate 12, height 5' 7"  (1.702 m), weight 189 lb (85.7 kg), SpO2 98 %.  Physical Exam Physical Exam  Constitutional: She is oriented to person, place, and time. She appears well-developed and well-nourished.  HENT:  Mouth/Throat: Oropharynx is clear and moist.  Eyes: Conjunctivae are normal. No scleral icterus.  Neck: Neck supple.  Cardiovascular: Normal rate, regular rhythm and normal heart sounds.  The patient was noted to be modestly tachycardic at about 105-110 while seated, falling promptly into the mid 90s when supine.  When reexamined when seated her pulse rose again.  Regular rhythm.  Pulmonary/Chest: Effort normal and breath sounds normal. Left breast exhibits no inverted nipple, no mass, no nipple discharge, no skin change and no tenderness.  Right breast implant in place, incision well healed. Mild radiation changes.    Lymphadenopathy:    She has no cervical adenopathy.    She has no axillary adenopathy.       Right: No supraclavicular adenopathy present.       Left: No supraclavicular adenopathy present.  Neurological: She is alert and oriented to person, place, and time.  Skin: Skin is warm and dry.  Psychiatric: Her behavior is normal.   Measurement of the upper extremities at a location 15 cm above as well as 10 and 20 cm below the olecranon process were  completed.  Right: 35, 28, 20 cm.  Left: 33.5, 28, 21 cm.    Data Reviewed 8 cm tumor post neoadjuvant treatment, 4/15 nodes positive.  Assessment    No evidence of recurrent disease.  Possible modest, chronic dehydration based on small amount of free water the patient consumes.    Plan  Recommend increasing water intake to help with increased heart rate.  The patient had somehow gotten the impression that removing her breast implant would lower her risk of recurrent disease on the right side.  She underwent a formal modified radical mastectomy with immediate reconstruction.  I see no indication for additional chest wall surgery at this time.  She had concerns about the long-term risk of contralateral cancer, thinking it might be up to 50%.  It certainly much more likely to be in the mid single digits, and I reviewed with her recommendations did not complete prophylactic mastectomies.  At this time I think she is a little bit more comfortable with giving the right reconstructed breast little more time for the comfort level and to continue observation on the left.  Discussed risk of breast cancer in contralateral breast (left).  While I would normally have her follow-up 1 year postop, to help reassess the changes she is appreciating in the breast and to provide her some reassurance that were keeping a careful eye on the left breast we will have her return in 6 months.  No mammograms at that time.    HPI, Physical Exam, Assessment and Plan have been scribed under the direction and in the presence of Robert Bellow, MD. Theresa Fetch, RN  HPI, Physical Exam, Assessment and Plan have been scribed under the direction and in the presence of Hervey Ard, MD.  Theresa Chaney, CMA  I have completed the exam and reviewed the above documentation for accuracy and completeness.  I agree with the above.  Haematologist has been used and any errors in dictation or transcription  are unintentional.  Hervey Ard, M.D., F.A.C.S.  Theresa Chaney 10/23/2018, 9:06 PM

## 2018-10-23 NOTE — Patient Instructions (Addendum)
Recommend increasing water intake to help with increased heart rate.  The patient is aware to call back for any questions or new concerns.  Patient will be asked to return to the office in 6 months.

## 2018-10-27 ENCOUNTER — Other Ambulatory Visit: Payer: BLUE CROSS/BLUE SHIELD

## 2018-10-30 ENCOUNTER — Other Ambulatory Visit: Payer: Self-pay | Admitting: *Deleted

## 2018-10-30 ENCOUNTER — Encounter
Admission: RE | Admit: 2018-10-30 | Discharge: 2018-10-30 | Disposition: A | Discharge status: Home / Self Care | Payer: BLUE CROSS/BLUE SHIELD | Source: Ambulatory Visit | Attending: Oncology | Admitting: Oncology

## 2018-10-30 DIAGNOSIS — M25562 Pain in left knee: Secondary | ICD-10-CM | POA: Diagnosis present

## 2018-10-30 DIAGNOSIS — M25561 Pain in right knee: Secondary | ICD-10-CM | POA: Insufficient documentation

## 2018-10-30 DIAGNOSIS — M25512 Pain in left shoulder: Secondary | ICD-10-CM | POA: Diagnosis present

## 2018-10-30 DIAGNOSIS — Z170 Estrogen receptor positive status [ER+]: Secondary | ICD-10-CM | POA: Diagnosis present

## 2018-10-30 DIAGNOSIS — C50811 Malignant neoplasm of overlapping sites of right female breast: Secondary | ICD-10-CM | POA: Diagnosis present

## 2018-10-30 DIAGNOSIS — M25511 Pain in right shoulder: Secondary | ICD-10-CM | POA: Diagnosis not present

## 2018-10-30 IMAGING — NM NM BONE WHOLE BODY
1 series · 2 of 2 positions shown · non-contrast
Comparison: [DATE]

Correlation: CT chest abdomen pelvis [DATE]

CLINICAL DATA: RIGHT breast cancer, staging, bone pain all over, no
trauma or fractures

EXAM:
NUCLEAR MEDICINE WHOLE BODY BONE SCAN
TECHNIQUE: Whole body anterior and posterior images were obtained approximately
3 hours after intravenous injection of radiopharmaceutical.
RADIOPHARMACEUTICALS:  23.34 mCi [PB] MDP IV

[Series 1000: 3 hr wholebody · 2.40mm/px · 2 of 2 frames shown]
[frame 1/2]
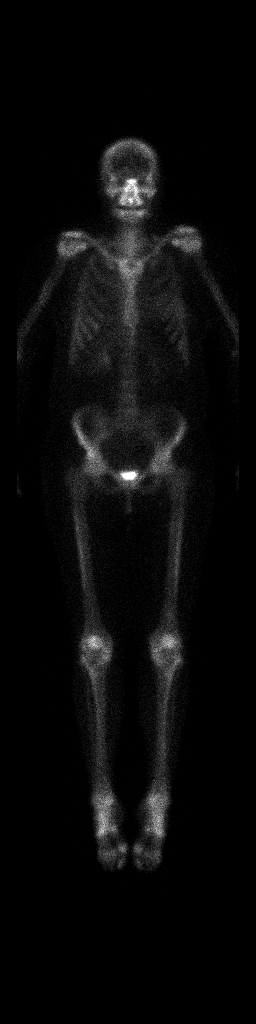
[frame 2/2]
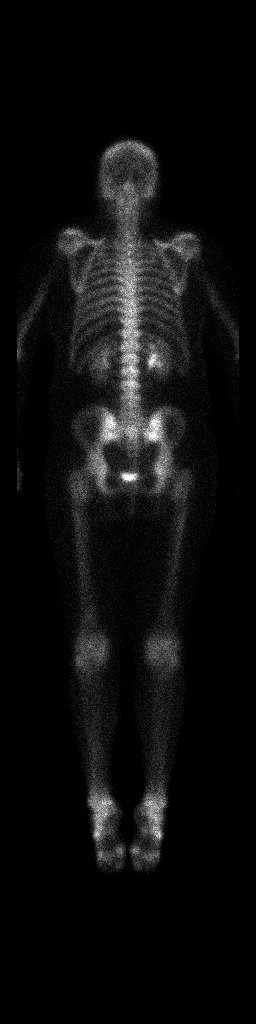

[2 of 2 positions shown; findings below may reference images not displayed]

FINDINGS: Minimal uptake of tracer in the feet typically degenerative.

Otherwise normal distribution of tracer within the axial and
appendicular skeleton.

No abnormal foci of increased osseous tracer accumulation are
identified to suggest osseous metastatic disease.

Expected urinary tract and soft tissue distribution of tracer.
IMPRESSION: No scintigraphic evidence of osseous metastatic disease.

## 2018-10-30 MED ORDER — TECHNETIUM TC 99M MEDRONATE IV KIT
20.0000 | PACK | Freq: Once | INTRAVENOUS | Status: AC | PRN
  Administered 2018-10-30: 23.34 via INTRAVENOUS

## 2018-10-31 MED ORDER — PREGABALIN 300 MG PO CAPS: 300.0000 mg | ORAL_CAPSULE | Freq: Every day | ORAL | 3 refills | Status: DC

## 2018-11-02 ENCOUNTER — Encounter: Payer: Self-pay | Admitting: Oncology

## 2018-11-05 ENCOUNTER — Other Ambulatory Visit: Payer: Self-pay

## 2018-11-05 ENCOUNTER — Ambulatory Visit
Admission: RE | Admit: 2018-11-05 | Discharge: 2018-11-05 | Disposition: A | Discharge status: Home / Self Care | Payer: BLUE CROSS/BLUE SHIELD | Source: Ambulatory Visit | Attending: Radiation Oncology | Admitting: Radiation Oncology

## 2018-11-05 ENCOUNTER — Encounter: Payer: Self-pay | Admitting: Radiation Oncology

## 2018-11-05 ENCOUNTER — Inpatient Hospital Stay (HOSPITAL_BASED_OUTPATIENT_CLINIC_OR_DEPARTMENT_OTHER): Payer: BLUE CROSS/BLUE SHIELD | Admitting: Oncology

## 2018-11-05 ENCOUNTER — Encounter: Payer: Self-pay | Admitting: Oncology

## 2018-11-05 ENCOUNTER — Inpatient Hospital Stay: Payer: BLUE CROSS/BLUE SHIELD

## 2018-11-05 VITALS — BP 111/72 | HR 88 | Temp 96.9°F | Wt 192.4 lb

## 2018-11-05 DIAGNOSIS — G620 Drug-induced polyneuropathy: Secondary | ICD-10-CM

## 2018-11-05 DIAGNOSIS — I5021 Acute systolic (congestive) heart failure: Secondary | ICD-10-CM

## 2018-11-05 DIAGNOSIS — Z90710 Acquired absence of both cervix and uterus: Secondary | ICD-10-CM

## 2018-11-05 DIAGNOSIS — M7989 Other specified soft tissue disorders: Secondary | ICD-10-CM

## 2018-11-05 DIAGNOSIS — C50911 Malignant neoplasm of unspecified site of right female breast: Secondary | ICD-10-CM | POA: Diagnosis not present

## 2018-11-05 DIAGNOSIS — N951 Menopausal and female climacteric states: Secondary | ICD-10-CM

## 2018-11-05 DIAGNOSIS — Z79811 Long term (current) use of aromatase inhibitors: Secondary | ICD-10-CM

## 2018-11-05 DIAGNOSIS — C50811 Malignant neoplasm of overlapping sites of right female breast: Secondary | ICD-10-CM

## 2018-11-05 DIAGNOSIS — Z90722 Acquired absence of ovaries, bilateral: Secondary | ICD-10-CM

## 2018-11-05 DIAGNOSIS — Z923 Personal history of irradiation: Secondary | ICD-10-CM

## 2018-11-05 DIAGNOSIS — Z79899 Other long term (current) drug therapy: Secondary | ICD-10-CM

## 2018-11-05 DIAGNOSIS — Z9221 Personal history of antineoplastic chemotherapy: Secondary | ICD-10-CM

## 2018-11-05 DIAGNOSIS — M25561 Pain in right knee: Secondary | ICD-10-CM

## 2018-11-05 DIAGNOSIS — R0602 Shortness of breath: Secondary | ICD-10-CM | POA: Diagnosis not present

## 2018-11-05 DIAGNOSIS — M25511 Pain in right shoulder: Secondary | ICD-10-CM

## 2018-11-05 DIAGNOSIS — Z170 Estrogen receptor positive status [ER+]: Secondary | ICD-10-CM

## 2018-11-05 DIAGNOSIS — F329 Major depressive disorder, single episode, unspecified: Secondary | ICD-10-CM

## 2018-11-05 DIAGNOSIS — Z9011 Acquired absence of right breast and nipple: Secondary | ICD-10-CM

## 2018-11-05 DIAGNOSIS — M25562 Pain in left knee: Secondary | ICD-10-CM

## 2018-11-05 DIAGNOSIS — Z9079 Acquired absence of other genital organ(s): Secondary | ICD-10-CM

## 2018-11-05 DIAGNOSIS — Z87891 Personal history of nicotine dependence: Secondary | ICD-10-CM

## 2018-11-05 DIAGNOSIS — Z421 Encounter for breast reconstruction following mastectomy: Secondary | ICD-10-CM

## 2018-11-05 DIAGNOSIS — M25512 Pain in left shoulder: Principal | ICD-10-CM

## 2018-11-05 DIAGNOSIS — F1190 Opioid use, unspecified, uncomplicated: Secondary | ICD-10-CM

## 2018-11-05 DIAGNOSIS — T451X5A Adverse effect of antineoplastic and immunosuppressive drugs, initial encounter: Secondary | ICD-10-CM

## 2018-11-05 LAB — CBC
HCT: 36.2 % (ref 36.0–46.0)
Hemoglobin: 12.1 g/dL (ref 12.0–15.0)
MCH: 28.6 pg (ref 26.0–34.0)
MCHC: 33.4 g/dL (ref 30.0–36.0)
MCV: 85.6 fL (ref 80.0–100.0)
Platelets: 286 10*3/uL (ref 150–400)
RBC: 4.23 MIL/uL (ref 3.87–5.11)
RDW: 12.8 % (ref 11.5–15.5)
WBC: 6.7 10*3/uL (ref 4.0–10.5)
nRBC: 0 % (ref 0.0–0.2)

## 2018-11-05 MED ORDER — DULOXETINE HCL 30 MG PO CPEP: ORAL_CAPSULE | ORAL | 3 refills | Status: DC

## 2018-11-05 MED ORDER — ESCITALOPRAM OXALATE 5 MG PO TABS: ORAL_TABLET | ORAL | 0 refills | Status: DC

## 2018-11-05 NOTE — Progress Notes (Signed)
Patient here for follow up. If she stands for a long time, feet swell. Left elbow and hand pain has remained the same.

## 2018-11-05 NOTE — Progress Notes (Signed)
Radiation Oncology Follow up Note  Name: Theresa Chaney   Date:   11/05/2018 MRN:  [id number] DOB: 04-Aug-1981    This 37 y.o. female presents to the clinic today for ne-month follow-up status post right chest and peripheral lymphatic radiation for stage IIIa ER positive PR negative HER-2/neu negative invasive mammary carcinoma status post right modified radical mastectomy and breast reconstruction.  REFERRING PROVIDER: Marguerita Merles, MD  HPI: patient is a 37 year old female now seen out 1 month having completed right chest and peripheral lymphatic radiation for stage IIIa ER positive PR negative invasive mammary carcinoma status post right modified radical mastectomy with reconstruction. Seen today in routine follow up she is doing well. She specifically denies breast tenderness cough or bone pain..she is having some hand pain not sure this is lymphedema since the upper extremity is unaffected. She's restarted letrozole tolerating that well.  COMPLICATIONS OF TREATMENT: none  FOLLOW UP COMPLIANCE: keeps appointments   PHYSICAL EXAM:  BP (P) 111/72 (BP Location: Left Arm, Patient Position: Sitting)   Pulse (P) 88   Temp (!) (P) 96.9 F (36.1 C) (Tympanic)   Wt (P) 192 lb 5.6 oz (87.2 kg)   LMP  (LMP Unknown) Comment: Ovaries and tubes removed.  BMI (P) 30.13 kg/m  She's had her reconstructed right breast no evidence of mass or nodularity in either breast is noted no axillary or supraclavicular adenopathy is identified.Well-developed well-nourished patient in NAD. HEENT reveals PERLA, EOMI, discs not visualized.  Oral cavity is clear. No oral mucosal lesions are identified. Neck is clear without evidence of cervical or supraclavicular adenopathy. Lungs are clear to A&P. Cardiac examination is essentially unremarkable with regular rate and rhythm without murmur rub or thrill. Abdomen is benign with no organomegaly or masses noted. Motor sensory and DTR levels are equal and symmetric in the  upper and lower extremities. Cranial nerves II through XII are grossly intact. Proprioception is intact. No peripheral adenopathy or edema is identified. No motor or sensory levels are noted. Crude visual fields are within normal range.  RADIOLOGY RESULTS: no current films for review  PLAN: the present time patient is doing well 1 month out. I'm please were overall progress. She continues on letrozole without side effect. I've asked to see her back in 4-5 months for follow-up. Patient is to call with any concerns. She continues close follow-up care with medical oncology.  I would like to take this opportunity to thank you for allowing me to participate in the care of your patient.Noreene Filbert, MD

## 2018-11-05 NOTE — Progress Notes (Signed)
Hematology/Oncology Follow Up Note [facility] [phone] [phone]  Patient Care Team: Marguerita Merles, MD as PCP - General (Family Medicine) End, Harrell Gave, MD as PCP - Cardiology (Cardiology) Rico Junker, RN as Oncology Nurse Navigator Earlie Server, MD as Consulting Physician (Oncology) Bary Castilla, Forest Gleason, MD (General Surgery) Noreene Filbert, MD as Referring Physician (Radiation Oncology)   Name of the patient: Theresa Chaney  [id number]  Feb 06, 1981   Date of visit: 11/05/18 REASON FOR VISIT Follow up for Assessment prior to chemotherapy treatment of breast cancer  Oncology History 10/11/2017 Diagnosed with [id number]  Neoadjuvant dose dense AC and 1 cycle of Taxol.  Interim image showed no treatment response. 03/19/2018 right mastectomy and right axillary dissection, bilateral salpingo-oophorectomy. ypT3 ypN2 #Negative genetic testing #06/11/2018 Finish adjuvant weekly Taxol x11 Grade 2 neuropathy  #right immediate breast reconstruction with placement of tissue expanders.  Cancer TREATMENT Neoadjuvant ddAC +one dose of Taxol, due to lack of response, surgery was offered. Case was discussed on breast tumor board. 03/19/2018 S/p right mastectomy and right axillary dissection, immediate breast reconstruction with placement of expanders. Also had elective bilateral salpingo-oophorectomy..  # s/p 11 cycles Taxol adjuvantly. Tolerated well.  # She has obtained dental clearance for starting Zometa.  S/p Zometa on 6/3/ 2019 # s/p adjuvant radiation, finished 10/10/2018  INTERVAL HISTORY 37yo female with above oncology history reviewed by me presents for follow-up of management of breast cancer.  Patient has completed right breast reconstruction on 07/20/2018 and s/p adjuvant radiation, finished 10/10/2018.   # chemotherapy induced neuropathy, on lyrica. She feels " it just making it". Although symptoms are somewhat controlled by Lyrica, she  continues to experience foot numbness after walking certain distance.   # SOB with moderate exertion. She has follow up appt with cardiology.  LVEF to 45%. She is on low dose metoprolol. Denies any SOB, chest pain.   # Bilateral upper extremity swelling, follows  Up with lymphedema clinic.   # Continues to have persistent bilateral finger tip and feet neuropathy, gabapentin did not work. She is on Lyrica 3072mdaily, which she feels working to some extent, but still not well controlled. Can't wear tennis shoes due to neuropathy.   # continues to have generalized bone pain. Bone scan was done during the interval.  No scintigraphic eveidence of osseous metastatic disease. She tried taking norco but don't feel bone pain is getting better.      Review of Systems  Constitutional: Positive for malaise/fatigue. Negative for chills, diaphoresis, fever and weight loss.  HENT: Negative for congestion, ear discharge, ear pain, hearing loss, nosebleeds, sinus pain, sore throat and tinnitus.   Eyes: Negative for blurred vision, double vision, photophobia, pain, discharge and redness.  Respiratory: Negative for cough, hemoptysis, sputum production, shortness of breath and wheezing.   Cardiovascular: Negative for chest pain, palpitations, orthopnea, claudication and leg swelling.  Gastrointestinal: Negative for abdominal pain, blood in stool, constipation, diarrhea, heartburn, melena, nausea and vomiting.  Genitourinary: Negative for dysuria, flank pain, frequency, hematuria and urgency.  Musculoskeletal: Positive for joint pain and myalgias. Negative for back pain and neck pain.       Hand swelling  Skin: Negative for itching and rash.  Neurological: Positive for tingling. Negative for dizziness, tremors, speech change, focal weakness, weakness and headaches.        tingling numbness burning sensation of bilateral hands and toes.    Endo/Heme/Allergies: Negative for environmental allergies and  polydipsia. Does not bruise/bleed  easily.  Psychiatric/Behavioral: Negative for depression, hallucinations and substance abuse. The patient is not nervous/anxious.     No Known Allergies  Patient Active Problem List   Diagnosis Date Noted  . Shortness of breath 08/23/2018  . Cardiomyopathy ([place]) 08/23/2018  . Preprocedural cardiovascular examination 08/23/2018  . Tachycardia 08/23/2018  . Palpitations 08/23/2018  . Estrogen receptor positive status (ER+) 04/04/2018  . Breast cancer of upper-outer quadrant of right female breast ([place]) 03/19/2018  . Family history of cancer   . Malignant neoplasm of overlapping sites of right breast in female, estrogen receptor positive ([place]) 10/19/2017     Past Medical History:  Diagnosis Date  . Anemia   . BRCA negative 11/26/2017  . Breast cancer ([place]) 10/11/2017   Multifocal, ER positive, PR negative, HER-2 negative. ypT3 ypN2a 8.7 cm, 4/15 nodes  . Cardiomyopathy ([place])    a. 10/2017 Echo: EF 60-65%, no rwma, Gr1 DD, nl RV size/fxn; b. 04/2018 Echo: EF 55-60%, no rwma, Nl RV size/fxn; c. 08/2018 Echo: EF 45%, diff HK, ? HK of antsept wall. Gr1 DD. Mild MR. Mild LAE/RAE. Mod dil RV.   [name] Chronic bronchitis ([place]) 11/2017  . COPD (chronic obstructive pulmonary disease) ([place])    MILD PER CXR  . Depression   . Family history of cancer   . GERD (gastroesophageal reflux disease)   . Headache    MIGRAINES  . Heart murmur    ASYMPTOMATIC  . Personal history of chemotherapy    current for right breast ca     Past Surgical History:  Procedure Laterality Date  . AXILLARY LYMPH NODE DISSECTION Right 03/19/2018   Procedure: AXILLARY LYMPH NODE DISSECTION;  Surgeon: Robert Bellow, MD;  Location: [facility] ORS;  Service: General;  Laterality: Right;  . BREAST BIOPSY Right 10/11/2017   12:30 posterior coil clip invasive mammary carcinoma  . BREAST BIOPSY Right 10/11/2017   11:30 middle depth ribbon clip DCIS  . BREAST BIOPSY Right 10/11/2017   5:30  anterior depth x shape invasive ductal carcinoma  . BREAST RECONSTRUCTION WITH PLACEMENT OF TISSUE EXPANDER AND FLEX HD (ACELLULAR HYDRATED DERMIS) Right 03/19/2018   Procedure: BREAST RECONSTRUCTION WITH PLACEMENT OF TISSUE EXPANDER AND FLEX HD (ACELLULAR HYDRATED DERMIS);  Surgeon: Wallace Going, DO;  Location: [facility] ORS;  Service: Plastics;  Laterality: Right;  . LAPAROSCOPIC BILATERAL SALPINGO OOPHERECTOMY Bilateral 03/19/2018   Procedure: LAPAROSCOPIC BILATERAL SALPINGO OOPHORECTOMY;  Surgeon: Benjaman Kindler, MD;  Location: [facility] ORS;  Service: Gynecology;  Laterality: Bilateral;  . MASTECTOMY Right 03/2018  . MASTECTOMY W/ SENTINEL NODE BIOPSY Right 03/19/2018   Procedure: MASTECTOMY WITH SENTINEL LYMPH NODE BIOPSY;  Surgeon: Robert Bellow, MD;  Location: [facility] ORS;  Service: General;  Laterality: Right;  . PORTACATH PLACEMENT Left 10/24/2017   Procedure: INSERTION PORT-A-CATH;  Surgeon: Robert Bellow, MD;  Location: [facility] ORS;  Service: General;  Laterality: Left;  . REMOVAL OF TISSUE EXPANDER AND PLACEMENT OF IMPLANT Right 07/20/2018   Procedure: REMOVAL OF RIGHT BREAST TISSUE EXPANDER AND PLACEMENT OF IMPLANT;  Surgeon: Wallace Going, DO;  Location: [place];  Service: Plastics;  Laterality: Right;    Social History   Socioeconomic History  . Marital status: Married    Spouse name: Not on file  . Number of children: Not on file  . Years of education: Not on file  . Highest education level: Not on file  Occupational History  . Occupation: Occupational psychologist    Comment: Therapist, art  Social Needs  . Financial resource strain: Not on file  . Food insecurity:    Worry: Not on file    Inability: Not on file  . Transportation needs:    Medical: Not on file    Non-medical: Not on file  Tobacco Use  . Smoking status: Former Smoker    Packs/day: 0.50    Years: 18.00    Pack years: 9.00    Types: Cigarettes    Start date:  06/21/2018  . Smokeless tobacco: Never Used  Substance and Sexual Activity  . Alcohol use: No    Frequency: Never  . Drug use: No  . Sexual activity: Yes    Birth control/protection: Injection  Lifestyle  . Physical activity:    Days per week: Not on file    Minutes per session: Not on file  . Stress: Not on file  Relationships  . Social connections:    Talks on phone: Not on file    Gets together: Not on file    Attends religious service: Not on file    Active member of club or organization: Not on file    Attends meetings of clubs or organizations: Not on file    Relationship status: Not on file  . Intimate partner violence:    Fear of current or ex partner: Not on file    Emotionally abused: Not on file    Physically abused: Not on file    Forced sexual activity: Not on file  Other Topics Concern  . Not on file  Social History Narrative  . Not on file     Family History  Problem Relation Age of Onset  . Melanoma Maternal Aunt        other aunts with BCC/SCC/Melanoma  . Diabetes Father   . Hypertension Father   . Hyperlipidemia Father   . Heart attack Father 7       "mild"  . Bladder Cancer Maternal Grandmother   . Cervical cancer Maternal Aunt 64       daughter w/ cervical cancer as well  . Melanoma Maternal Uncle        other uncles with BCC/SCC/Melanoma     Current Outpatient Medications:  .  albuterol (PROVENTIL HFA;VENTOLIN HFA) 108 (90 Base) MCG/ACT inhaler, Inhale 2 puffs into the lungs every 6 (six) hours as needed for wheezing or shortness of breath (Cough)., Disp: 1 Inhaler, Rfl: 2 .  ALPRAZolam (XANAX) 0.25 MG tablet, Take 0.25 mg at bedtime as needed by mouth for anxiety or sleep. , Disp: , Rfl:  .  Calcium Carb-Cholecalciferol (CALCIUM 600 + D PO), Take 1 tablet daily by mouth., Disp: , Rfl:  .  chlorhexidine (PERIDEX) 0.12 % solution, USE AS DIRECTED 15MLS IN THE MOUTH OR THROAT TWICE A DAY, Disp: 473 mL, Rfl: 1 .  cyclobenzaprine (FLEXERIL) 5 MG  tablet, Take 1 tablet (5 mg total) by mouth 3 (three) times daily as needed for muscle spasms., Disp: 90 tablet, Rfl: 0 .  doxylamine, Sleep, (UNISOM) 25 MG tablet, Take 25 mg by mouth at bedtime as needed for sleep., Disp: , Rfl:  .  esomeprazole (NEXIUM) 40 MG capsule, Take 40 mg by mouth every morning. , Disp: , Rfl:  .  HYDROcodone-acetaminophen (NORCO) 5-325 MG tablet, Take 1 tablet by mouth every 6 (six) hours as needed for moderate pain., Disp: 30 tablet, Rfl: 0 .  ibuprofen (ADVIL,MOTRIN) 800 MG tablet, Take 1 tablet (800 mg total) by mouth every 8 (eight) hours  as needed for moderate pain., Disp: 30 tablet, Rfl: 1 .  letrozole (FEMARA) 2.5 MG tablet, Take 1 tablet (2.5 mg total) by mouth daily., Disp: 90 tablet, Rfl: 0 .  loratadine (CLARITIN) 10 MG tablet, Take 10 mg by mouth daily. , Disp: , Rfl:  .  LORazepam (ATIVAN) 1 MG tablet, Take 1 mg by mouth every 8 (eight) hours., Disp: , Rfl:  .  metoprolol succinate (TOPROL XL) 25 MG 24 hr tablet, Take 0.5 tablets (12.5 mg total) by mouth daily., Disp: 45 tablet, Rfl: 3 .  pregabalin (LYRICA) 300 MG capsule, Take 1 capsule (300 mg total) by mouth daily. (Patient taking differently: Take 150 mg by mouth 2 (two) times daily. ), Disp: 30 capsule, Rfl: 3 .  prochlorperazine (COMPAZINE) 10 MG tablet, Take 1 tablet (10 mg total) by mouth every 6 (six) hours as needed (Nausea or vomiting)., Disp: 30 tablet, Rfl: 1 .  promethazine (PHENERGAN) 25 MG tablet, Take 1 tablet (25 mg total) by mouth every 6 (six) hours as needed for nausea or vomiting., Disp: 30 tablet, Rfl: 0 .  pyridOXINE (VITAMIN B-6) 100 MG tablet, Take 1 tablet (100 mg total) by mouth daily., Disp: 30 tablet, Rfl: 1 .  vitamin C (ASCORBIC ACID) 500 MG tablet, Take 500 mg by mouth daily., Disp: , Rfl:  .  DULoxetine (CYMBALTA) 30 MG capsule, Take 42m dialy x 2 weeks, Then take 365mBID., Disp: 60 capsule, Rfl: 3 .  escitalopram (LEXAPRO) 5 MG tablet, Take 1523maily x 7 days. Then 41m68m 7 days, Then 5mg 105mly, then stop, Disp: 45 tablet, Rfl: 0 No current facility-administered medications for this visit.   Facility-Administered Medications Ordered in Other Visits:  .  heparin lock flush 100 unit/mL, 500 Units, Intravenous, Once, Nakota Ackert, ZEarlie Server  Physical exam:  Vitals:   11/05/18 1409  BP: 111/72  Pulse: 88  Temp: (!) 96.9 F (36.1 C)  TempSrc: Tympanic  Weight: 192 lb 6.4 oz (87.3 kg)  ECOG 1 Physical Exam  Constitutional: She is oriented to person, place, and time. No distress.  HENT:  Head: Normocephalic and atraumatic.  Nose: Nose normal.  Mouth/Throat: Oropharynx is clear and moist. No oropharyngeal exudate.  No thrush,  Eyes: Pupils are equal, round, and reactive to light. Conjunctivae and EOM are normal. Left eye exhibits no discharge. No scleral icterus.  Neck: Normal range of motion. Neck supple. No JVD present.  Cardiovascular: Normal rate, regular rhythm and normal heart sounds.  No murmur heard. Pulmonary/Chest: Effort normal and breath sounds normal. No respiratory distress. She has no wheezes. She has no rales. She exhibits no tenderness.  Abdominal: Soft. Bowel sounds are normal. She exhibits no distension and no mass. There is no tenderness. There is no rebound.  Musculoskeletal: Normal range of motion. She exhibits no edema.  Bilateral hand swelling.   Lymphadenopathy:    She has no cervical adenopathy.  Neurological: She is alert and oriented to person, place, and time. No cranial nerve deficit. She exhibits normal muscle tone. Coordination normal.  Skin: Skin is warm and dry. No rash noted. She is not diaphoretic. No erythema.  Psychiatric: Affect normal.       CMP Latest Ref Rng & Units 09/12/2018  Glucose 70 - 99 mg/dL 109(H)  BUN 6 - 20 mg/dL 16  Creatinine 0.44 - 1.00 mg/dL 0.49  Sodium 135 - 145 mmol/L 140  Potassium 3.5 - 5.1 mmol/L 4.0  Chloride 98 - 111 mmol/L 103  CO2 22 - 32 mmol/L 28  Calcium 8.9 - 10.3 mg/dL 9.8  Total  Protein 6.5 - 8.1 g/dL 7.5  Total Bilirubin 0.3 - 1.2 mg/dL 0.3  Alkaline Phos 38 - 126 U/L 65  AST 15 - 41 U/L 23  ALT 0 - 44 U/L 18   CBC Latest Ref Rng & Units 11/05/2018  WBC 4.0 - 10.5 K/uL 6.7  Hemoglobin 12.0 - 15.0 g/dL 12.1  Hematocrit 36.0 - 46.0 % 36.2  Platelets 150 - 400 K/uL 286   RADIOGRAPHIC STUDIES: I have personally reviewed the radiological images as listed and agreed with the findings in the report. 08/23/2018 DEXA The BMD measured at Femur Neck Right is 0.900 g/cm2 with a T-score of -1.0. This patient is considered normal according to [facility]) criteria. Site Region Measured Measured WHO Young Adult BMD Date       Age      Classification T-score AP Spine L1-L4 08/23/2018 35.5 Normal -0.8 1.091 g/cm2 DualFemur Neck Right 08/23/2018 35.5 Normal -1.0 0.900 g/cm2  08/15/2018 2D echo Study Conclusions - Left ventricle: The cavity size was mildly dilated. Systolic   function was mildly reduced. The estimated ejection fraction was   45%. Diffuse hypokinesis. Hypokinesis of the anteroseptal   myocardium. Doppler parameters are consistent with abnormal left   ventricular relaxation (grade 1 diastolic dysfunction). - Aortic valve: Valve area (VTI): 1.88 cm^2. Valve area (Vmax): 1.8   cm^2. Valve area (Vmean): 1.74 cm^2. - Mitral valve: There was mild regurgitation. Valve area by   continuity equation (using LVOT flow): 2.22 cm^2. - Left atrium: The atrium was mildly dilated. - Right ventricle: The cavity size was moderately dilated. - Right atrium: The atrium was mildly dilated.   Assessment and plan- Patient is a 37 y.o. female presents for evaluation of newly diagnosed multicentric right breast cancer.  Cancer Staging Breast cancer of upper-outer quadrant of right female breast [facility]) Staging form: Breast, AJCC 8th Edition - Clinical: G2, ER+, PR+, HER2- - Signed by Earlie Server, MD on 04/05/2018 - Pathologic stage from 04/04/2018: No Stage  Recommended (ypT3, pN2, cM0, G3, ER+, PR-, HER2-) - Signed by Earlie Server, MD on 04/04/2018 - Pathologic: No stage assigned - Unsigned     1. Malignant neoplasm of overlapping sites of right breast in female, estrogen receptor positive ([place])   2. Aromatase inhibitor use   3. Neuropathy due to chemotherapeutic drug ([place])   4. Acute systolic congestive heart failure ([place])   5. Bilateral hand swelling   6. Vasomotor symptoms due to menopause   continue Letrozole.   Bone scan was independently reviewed with patient. No osseous metastatic disease.  Discussed with patient that she is experiencing vasomotor symptoms [ bone pain, weight gain, neuropathy, hot flashes]. of menopause.   Discussed about switching Effexor to Cymbalta. Hopefully having more neuropathy control.  Also discussed that her depression may be less controlled. Will monitor her symptoms. She voices understanding and willing to give a trial.   # Chemotherapy induced neuropathy, continue Lyrica 331m daily. Refer to acupuncture  # refer to breast cancer survivorship # she will need unilateral mammogram in January 2020. # Zometa due in December.  Follow up in 4 weeks.  .  Orders Placed This Encounter  Procedures  . CBC with Differential/Platelet    Standing Status:   Future    Standing Expiration Date:   11/06/2019  . Comprehensive metabolic panel    Standing Status:   Future  Standing Expiration Date:   11/06/2019  . VITAMIN D 25 Hydroxy (Vit-D Deficiency, Fractures)    Standing Status:   Future    Standing Expiration Date:   01/05/2019  . Total face to face encounter time for this patient visit was 25 min. >50% of the time was  spent in counseling and coordination of care.  Earlie Server, MD, PhD Hematology Oncology [facility] Pager- [phone] 11/05/18

## 2018-11-07 LAB — DRUG SCREEN 764883 11+OXYCO+ALC+CRT-BUND
Amphetamines, Urine: NEGATIVE ng/mL
BENZODIAZ UR QL: NEGATIVE ng/mL
Barbiturate: NEGATIVE ng/mL
CREATININE: 84.7 mg/dL (ref 20.0–300.0)
Cannabinoid Quant, Ur: NEGATIVE ng/mL
Cocaine (Metabolite): NEGATIVE ng/mL
Ethanol: NEGATIVE %
MEPERIDINE: NEGATIVE ng/mL
METHADONE SCREEN, URINE: NEGATIVE ng/mL
OPIATE SCREEN URINE: NEGATIVE ng/mL
OXYCODONE+OXYMORPHONE UR QL SCN: NEGATIVE ng/mL
PROPOXYPHENE, URINE: NEGATIVE ng/mL
Phencyclidine: NEGATIVE ng/mL
Tramadol: NEGATIVE ng/mL
pH, Urine: 5.5 (ref 4.5–8.9)

## 2018-11-13 ENCOUNTER — Encounter: Payer: Self-pay | Admitting: Oncology

## 2018-11-13 ENCOUNTER — Encounter: Payer: Self-pay | Admitting: Occupational Therapy

## 2018-11-13 ENCOUNTER — Encounter: Payer: Self-pay | Admitting: Radiation Oncology

## 2018-11-16 ENCOUNTER — Ambulatory Visit: Payer: BLUE CROSS/BLUE SHIELD | Admitting: Internal Medicine

## 2018-11-19 ENCOUNTER — Ambulatory Visit (INDEPENDENT_AMBULATORY_CARE_PROVIDER_SITE_OTHER): Payer: BLUE CROSS/BLUE SHIELD | Admitting: Internal Medicine

## 2018-11-19 ENCOUNTER — Ambulatory Visit (INDEPENDENT_AMBULATORY_CARE_PROVIDER_SITE_OTHER): Payer: BLUE CROSS/BLUE SHIELD

## 2018-11-19 ENCOUNTER — Encounter: Payer: Self-pay | Admitting: Internal Medicine

## 2018-11-19 ENCOUNTER — Inpatient Hospital Stay: Payer: BLUE CROSS/BLUE SHIELD | Attending: Oncology

## 2018-11-19 VITALS — BP 106/76 | HR 86 | Ht 67.0 in | Wt 197.2 lb

## 2018-11-19 DIAGNOSIS — R002 Palpitations: Secondary | ICD-10-CM

## 2018-11-19 DIAGNOSIS — M7989 Other specified soft tissue disorders: Secondary | ICD-10-CM | POA: Insufficient documentation

## 2018-11-19 DIAGNOSIS — C50411 Malignant neoplasm of upper-outer quadrant of right female breast: Secondary | ICD-10-CM | POA: Insufficient documentation

## 2018-11-19 DIAGNOSIS — Z923 Personal history of irradiation: Secondary | ICD-10-CM | POA: Diagnosis not present

## 2018-11-19 DIAGNOSIS — I428 Other cardiomyopathies: Secondary | ICD-10-CM

## 2018-11-19 DIAGNOSIS — Z170 Estrogen receptor positive status [ER+]: Secondary | ICD-10-CM | POA: Insufficient documentation

## 2018-11-19 DIAGNOSIS — T451X5A Adverse effect of antineoplastic and immunosuppressive drugs, initial encounter: Secondary | ICD-10-CM | POA: Insufficient documentation

## 2018-11-19 DIAGNOSIS — Z452 Encounter for adjustment and management of vascular access device: Secondary | ICD-10-CM | POA: Diagnosis present

## 2018-11-19 DIAGNOSIS — G620 Drug-induced polyneuropathy: Secondary | ICD-10-CM | POA: Insufficient documentation

## 2018-11-19 DIAGNOSIS — F329 Major depressive disorder, single episode, unspecified: Secondary | ICD-10-CM | POA: Insufficient documentation

## 2018-11-19 DIAGNOSIS — M898X9 Other specified disorders of bone, unspecified site: Secondary | ICD-10-CM | POA: Diagnosis not present

## 2018-11-19 DIAGNOSIS — Z79899 Other long term (current) drug therapy: Secondary | ICD-10-CM | POA: Diagnosis not present

## 2018-11-19 DIAGNOSIS — Z9011 Acquired absence of right breast and nipple: Secondary | ICD-10-CM | POA: Diagnosis not present

## 2018-11-19 DIAGNOSIS — Z9221 Personal history of antineoplastic chemotherapy: Secondary | ICD-10-CM | POA: Diagnosis not present

## 2018-11-19 DIAGNOSIS — Z87891 Personal history of nicotine dependence: Secondary | ICD-10-CM | POA: Diagnosis not present

## 2018-11-19 DIAGNOSIS — N951 Menopausal and female climacteric states: Secondary | ICD-10-CM | POA: Insufficient documentation

## 2018-11-19 DIAGNOSIS — Z9882 Breast implant status: Secondary | ICD-10-CM | POA: Insufficient documentation

## 2018-11-19 DIAGNOSIS — Z95828 Presence of other vascular implants and grafts: Secondary | ICD-10-CM

## 2018-11-19 DIAGNOSIS — Z90722 Acquired absence of ovaries, bilateral: Secondary | ICD-10-CM | POA: Diagnosis not present

## 2018-11-19 DIAGNOSIS — M2550 Pain in unspecified joint: Secondary | ICD-10-CM | POA: Insufficient documentation

## 2018-11-19 DIAGNOSIS — Z79811 Long term (current) use of aromatase inhibitors: Secondary | ICD-10-CM | POA: Diagnosis not present

## 2018-11-19 MED ORDER — HEPARIN SOD (PORK) LOCK FLUSH 100 UNIT/ML IV SOLN
500.0000 [IU] | Freq: Once | INTRAVENOUS | Status: AC
  Administered 2018-11-19: 500 [IU] via INTRAVENOUS
  Filled 2018-11-19: qty 5

## 2018-11-19 MED ORDER — SODIUM CHLORIDE 0.9% FLUSH
10.0000 mL | INTRAVENOUS | Status: AC | PRN
  Administered 2018-11-19: 10 mL via INTRAVENOUS
  Filled 2018-11-19: qty 10

## 2018-11-19 NOTE — Progress Notes (Signed)
Follow-up Outpatient Visit Date: 11/19/2018  Primary Care Provider: Marguerita Merles, [place] [address]  Chief Complaint: Follow-up cardiomyopathy  HPI:  Theresa Chaney is a 37 y.o. year-old female with history of  breast cancer status post mastectomy and chemotherapy complicated by cardiomyopathy with mildly reduced LVEF, who presents for follow-up of tachycardia.  She was last seen in our office in 09/2018 by Ignacia Bayley, NP, at which time she was doing relatively well.  Myocardial perfusion stress test showed basal and mid anterior defect felt to be most consistent with artifact.  LVEF was normal.  Today, [name] reports overall feeling well other than intermittent palpitations with elevated heart rates over the last week or 2.  She notes that her heart rate will sometimes abruptly jump up to close to 150 bpm.  Most recent episode occurred while she was sitting in her car.  She notes associated shortness of breath.  Episodes typically last a few minutes.  She otherwise denies shortness of breath as well as chest pain, lightheadedness, orthopnea, and PND.  She notes occasional dependent edema in her legs and chronic swelling of her hands ever since her cancer diagnosis and treatment.  --------------------------------------------------------------------------------------------------  Cardiovascular History & Procedures: Cardiovascular Problems:  Cardiomyopathy  Palpitations  Risk Factors:  None  Cath/PCI:  None  CV Surgery:  None  EP Procedures and Devices:  None  Non-Invasive Evaluation(s):  Pharmacologic myocardial perfusion stress test (10/12/2018): Suboptimal study due to breast attenuation and extracardiac activity.  Moderate in size, moderate in severity, basal and mid anterior defect most consistent with artifact.  LVEF 55 to 65%.  TTE (08/15/2018): Mildly dilated LV with LVEF of 45% and diffuse hypokinesis.  Possible hypokinesis of the  anteroseptal myocardium.  Grade 1 diastolic dysfunction.  Mild MR.  Mild left atrial enlargement.  Moderately dilated RV.  Mild right atrial enlargement.  TTE (04/26/2018): Normal LV size and wall thickness. LVEF 55 to 60% with normal wall motion. Normal diastolic function. Normal RV size and function. No significant valvular abnormalities.  TTE (10/26/2017): Normal LV size. LVEF 60 to 65% with normal wall motion. Grade 1 diastolic dysfunction. Normal RV size and function. Normal pulmonary artery pressure.  Recent CV Pertinent Labs: Lab Results  Component Value Date   INR 1.04 12/11/2017   K 4.0 09/12/2018   BUN 16 09/12/2018   CREATININE 0.49 09/12/2018    Past medical and surgical history were reviewed and updated in EPIC.  Current Meds  Medication Sig  . albuterol (PROVENTIL HFA;VENTOLIN HFA) 108 (90 Base) MCG/ACT inhaler Inhale 2 puffs into the lungs every 6 (six) hours as needed for wheezing or shortness of breath (Cough).  . ALPRAZolam (XANAX) 0.25 MG tablet Take 0.25 mg at bedtime as needed by mouth for anxiety or sleep.   . Calcium Carb-Cholecalciferol (CALCIUM 600 + D PO) Take 1 tablet daily by mouth.  . chlorhexidine (PERIDEX) 0.12 % solution USE AS DIRECTED 15MLS IN THE MOUTH OR THROAT TWICE A DAY  . cyclobenzaprine (FLEXERIL) 5 MG tablet Take 1 tablet (5 mg total) by mouth 3 (three) times daily as needed for muscle spasms.  [name] doxylamine, Sleep, (UNISOM) 25 MG tablet Take 25 mg by mouth at bedtime as needed for sleep.  . DULoxetine (CYMBALTA) 30 MG capsule Take 30mg  dialy x 2 weeks, Then take 30mg  BID.  [name] escitalopram (LEXAPRO) 5 MG tablet Take 15mg  daily x 7 days. Then 10mg  x 7 days, Then 5mg  daily, then stop  .  esomeprazole (NEXIUM) 40 MG capsule Take 40 mg by mouth every morning.   [name] HYDROcodone-acetaminophen (NORCO) 5-325 MG tablet Take 1 tablet by mouth every 6 (six) hours as needed for moderate pain.  [name] ibuprofen (ADVIL,MOTRIN) 800 MG tablet Take 1 tablet (800 mg  total) by mouth every 8 (eight) hours as needed for moderate pain.  [name] letrozole (FEMARA) 2.5 MG tablet Take 1 tablet (2.5 mg total) by mouth daily.  [name] loratadine (CLARITIN) 10 MG tablet Take 10 mg by mouth daily.   [name] LORazepam (ATIVAN) 1 MG tablet Take 1 mg by mouth every 8 (eight) hours.  . metoprolol succinate (TOPROL XL) 25 MG 24 hr tablet Take 0.5 tablets (12.5 mg total) by mouth daily.  . pregabalin (LYRICA) 300 MG capsule Take 1 capsule (300 mg total) by mouth daily. (Patient taking differently: Take 150 mg by mouth 2 (two) times daily. )  . prochlorperazine (COMPAZINE) 10 MG tablet Take 1 tablet (10 mg total) by mouth every 6 (six) hours as needed (Nausea or vomiting).  . promethazine (PHENERGAN) 25 MG tablet Take 1 tablet (25 mg total) by mouth every 6 (six) hours as needed for nausea or vomiting.  . pyridOXINE (VITAMIN B-6) 100 MG tablet Take 1 tablet (100 mg total) by mouth daily.  . vitamin C (ASCORBIC ACID) 500 MG tablet Take 500 mg by mouth daily.    Allergies: Patient has no known allergies.  Social History   Tobacco Use  . Smoking status: Former Smoker    Packs/day: 0.50    Years: 18.00    Pack years: 9.00    Types: Cigarettes    Start date: 06/21/2018  . Smokeless tobacco: Never Used  Substance Use Topics  . Alcohol use: No    Frequency: Never  . Drug use: No    Family History  Problem Relation Age of Onset  . Melanoma Maternal Aunt        other aunts with BCC/SCC/Melanoma  . Diabetes Father   . Hypertension Father   . Hyperlipidemia Father   . Heart attack Father 32       "mild"  . Bladder Cancer Maternal Grandmother   . Cervical cancer Maternal Aunt 64       daughter w/ cervical cancer as well  . Melanoma Maternal Uncle        other uncles with BCC/SCC/Melanoma    Review of Systems: A 12-system review of systems was performed and was negative except as noted in the  HPI.  --------------------------------------------------------------------------------------------------  Physical Exam: BP 106/76 (BP Location: Left Arm, Patient Position: Sitting, Cuff Size: Normal)   Pulse 86   Ht 5\' 7"  (1.702 m)   Wt 197 lb 4 oz (89.5 kg)   LMP  (LMP Unknown) Comment: Ovaries and tubes removed.  BMI 30.89 kg/m   General: NAD. HEENT: No conjunctival pallor or scleral icterus. Moist mucous membranes.  OP clear. Neck: Supple without lymphadenopathy, thyromegaly, JVD, or HJR. Lungs: Normal work of breathing. Clear to auscultation bilaterally without wheezes or crackles. Heart: Regular rate and rhythm without murmurs, rubs, or gallops. Non-displaced PMI. Abd: Bowel sounds present. Soft, NT/ND without hepatosplenomegaly Ext: No lower extremity edema. Radial, PT, and DP pulses are 2+ bilaterally. Skin: Warm and dry without rash.  EKG: Normal sinus rhythm without abnormalities.  Lab Results  Component Value Date   WBC 6.7 11/05/2018   HGB 12.1 11/05/2018   HCT 36.2 11/05/2018   MCV 85.6 11/05/2018   PLT 286 11/05/2018  Lab Results  Component Value Date   NA 140 09/12/2018   K 4.0 09/12/2018   CL 103 09/12/2018   CO2 28 09/12/2018   BUN 16 09/12/2018   CREATININE 0.49 09/12/2018   GLUCOSE 109 (H) 09/12/2018   ALT 18 09/12/2018    No results found for: CHOL, HDL, LDLCALC, LDLDIRECT, TRIG, CHOLHDL  --------------------------------------------------------------------------------------------------  ASSESSMENT AND PLAN: Palpitations Symptoms most suggestive of paroxysmal atrial arrhythmia.  EKG today is normal.  Given several episodes over the last week, we have agreed to perform a 14-day event monitor for further evaluation.  Nonischemic cardiomyopathy Ms. Tupou appears euvolemic with stable NYHA class II symptoms, driven predominantly, I suspect, by her cancer and subsequent treatment.  As previously noted, I found her LVEF to be low normal on prior  echoes within normal limits on most recent myocardial perfusion stress test.  We will continue current medications today.  Follow-up: Return to clinic in 6 weeks.  Nelva Bush, MD 11/19/2018 10:01 AM

## 2018-11-19 NOTE — Patient Instructions (Signed)
Medication Instructions:  Your physician recommends that you continue on your current medications as directed. Please refer to the Current Medication list given to you today.  If you need a refill on your cardiac medications before your next appointment, please call your pharmacy.   Lab work: none If you have labs (blood work) drawn today and your tests are completely normal, you will receive your results only by: [name] MyChart Message (if you have MyChart) OR . A paper copy in the mail If you have any lab test that is abnormal or we need to change your treatment, we will call you to review the results.  Testing/Procedures: Your physician has recommended that you wear an 14 DAY ZIO event monitor. Event monitors are medical devices that record the heart's electrical activity. Doctors most often [place] these monitors to diagnose arrhythmias. Arrhythmias are problems with the speed or rhythm of the heartbeat. The monitor is a small, portable device. You can wear one while you do your normal daily activities. This is usually used to diagnose what is causing palpitations/syncope (passing out). A Zio Patch Event Heart monitor will be applied to your chest today.  You will wear the patch for 14 days. After 24 hours, you may shower with the heart monitor on. If you feel any PALPITATIONS, you may press and release the button in the middle of the monitor.    Follow-Up: At [facility], you and your health needs are our priority.  As part of our continuing mission to provide you with exceptional heart care, we have created designated Provider Care Teams.  These Care Teams include your primary Cardiologist (physician) and Advanced Practice Providers (APPs -  Physician Assistants and Nurse Practitioners) who all work together to provide you with the care you need, when you need it. You will need a follow up appointment in 6 weeks.  Please call our office 2 months in advance to schedule this appointment.  You may  see Nelva Bush, MD or one of the following Advanced Practice Providers on your designated Care Team:   Murray Hodgkins, NP Christell Faith, PA-C . Marrianne Mood, PA-C

## 2018-11-20 ENCOUNTER — Encounter: Payer: Self-pay | Admitting: Internal Medicine

## 2018-11-21 ENCOUNTER — Encounter: Payer: Self-pay | Admitting: Oncology

## 2018-12-06 ENCOUNTER — Other Ambulatory Visit: Payer: Self-pay | Admitting: Oncology

## 2018-12-07 ENCOUNTER — Telehealth: Payer: Self-pay | Admitting: Internal Medicine

## 2018-12-07 ENCOUNTER — Inpatient Hospital Stay: Payer: BLUE CROSS/BLUE SHIELD

## 2018-12-07 ENCOUNTER — Inpatient Hospital Stay (HOSPITAL_BASED_OUTPATIENT_CLINIC_OR_DEPARTMENT_OTHER): Payer: BLUE CROSS/BLUE SHIELD | Admitting: Oncology

## 2018-12-07 ENCOUNTER — Encounter: Payer: Self-pay | Admitting: Oncology

## 2018-12-07 ENCOUNTER — Other Ambulatory Visit: Payer: Self-pay

## 2018-12-07 VITALS — BP 116/76 | HR 93 | Temp 96.4°F | Resp 18 | Wt 199.9 lb

## 2018-12-07 DIAGNOSIS — N951 Menopausal and female climacteric states: Secondary | ICD-10-CM

## 2018-12-07 DIAGNOSIS — Z923 Personal history of irradiation: Secondary | ICD-10-CM

## 2018-12-07 DIAGNOSIS — M898X9 Other specified disorders of bone, unspecified site: Secondary | ICD-10-CM

## 2018-12-07 DIAGNOSIS — Z87891 Personal history of nicotine dependence: Secondary | ICD-10-CM

## 2018-12-07 DIAGNOSIS — T451X5A Adverse effect of antineoplastic and immunosuppressive drugs, initial encounter: Secondary | ICD-10-CM

## 2018-12-07 DIAGNOSIS — Z9882 Breast implant status: Secondary | ICD-10-CM

## 2018-12-07 DIAGNOSIS — C50811 Malignant neoplasm of overlapping sites of right female breast: Secondary | ICD-10-CM

## 2018-12-07 DIAGNOSIS — Z90722 Acquired absence of ovaries, bilateral: Secondary | ICD-10-CM

## 2018-12-07 DIAGNOSIS — Z79811 Long term (current) use of aromatase inhibitors: Secondary | ICD-10-CM

## 2018-12-07 DIAGNOSIS — Z9221 Personal history of antineoplastic chemotherapy: Secondary | ICD-10-CM

## 2018-12-07 DIAGNOSIS — M2550 Pain in unspecified joint: Secondary | ICD-10-CM

## 2018-12-07 DIAGNOSIS — C50411 Malignant neoplasm of upper-outer quadrant of right female breast: Secondary | ICD-10-CM | POA: Diagnosis not present

## 2018-12-07 DIAGNOSIS — M7989 Other specified soft tissue disorders: Secondary | ICD-10-CM | POA: Diagnosis not present

## 2018-12-07 DIAGNOSIS — G620 Drug-induced polyneuropathy: Secondary | ICD-10-CM

## 2018-12-07 DIAGNOSIS — R002 Palpitations: Secondary | ICD-10-CM

## 2018-12-07 DIAGNOSIS — Z170 Estrogen receptor positive status [ER+]: Secondary | ICD-10-CM

## 2018-12-07 DIAGNOSIS — F329 Major depressive disorder, single episode, unspecified: Secondary | ICD-10-CM

## 2018-12-07 DIAGNOSIS — Z9011 Acquired absence of right breast and nipple: Secondary | ICD-10-CM

## 2018-12-07 DIAGNOSIS — Z79899 Other long term (current) drug therapy: Secondary | ICD-10-CM

## 2018-12-07 LAB — COMPREHENSIVE METABOLIC PANEL
ALBUMIN: 4.3 g/dL (ref 3.5–5.0)
ALK PHOS: 85 U/L (ref 38–126)
ALT: 19 U/L (ref 0–44)
ANION GAP: 8 (ref 5–15)
AST: 27 U/L (ref 15–41)
BILIRUBIN TOTAL: 0.5 mg/dL (ref 0.3–1.2)
BUN: 16 mg/dL (ref 6–20)
CO2: 26 mmol/L (ref 22–32)
Calcium: 9.2 mg/dL (ref 8.9–10.3)
Chloride: 106 mmol/L (ref 98–111)
Creatinine, Ser: 0.77 mg/dL (ref 0.44–1.00)
GFR calc Af Amer: >60 mL/min (ref >60)
GLUCOSE: 124 mg/dL — AB (ref 70–99)
POTASSIUM: 3.7 mmol/L (ref 3.5–5.1)
Sodium: 140 mmol/L (ref 135–145)
TOTAL PROTEIN: 7.3 g/dL (ref 6.5–8.1)

## 2018-12-07 LAB — CBC WITH DIFFERENTIAL/PLATELET
Abs Immature Granulocytes: 0.02 10*3/uL (ref 0.00–0.07)
BASOS PCT: 0 %
Basophils Absolute: 0 10*3/uL (ref 0.0–0.1)
EOS ABS: 0.1 10*3/uL (ref 0.0–0.5)
Eosinophils Relative: 1 %
HCT: 36.1 % (ref 36.0–46.0)
Hemoglobin: 11.8 g/dL — ABNORMAL LOW (ref 12.0–15.0)
Immature Granulocytes: 0 %
Lymphocytes Relative: 32 %
Lymphs Abs: 1.9 10*3/uL (ref 0.7–4.0)
MCH: 28.4 pg (ref 26.0–34.0)
MCHC: 32.7 g/dL (ref 30.0–36.0)
MCV: 87 fL (ref 80.0–100.0)
Monocytes Absolute: 0.6 10*3/uL (ref 0.1–1.0)
Monocytes Relative: 11 %
Neutro Abs: 3.4 10*3/uL (ref 1.7–7.7)
Neutrophils Relative %: 56 %
Platelets: 283 10*3/uL (ref 150–400)
RBC: 4.15 MIL/uL (ref 3.87–5.11)
RDW: 12.6 % (ref 11.5–15.5)
WBC: 6 10*3/uL (ref 4.0–10.5)
nRBC: 0 % (ref 0.0–0.2)

## 2018-12-07 MED ORDER — SODIUM CHLORIDE 0.9% FLUSH
10.0000 mL | Freq: Once | INTRAVENOUS | Status: AC
  Administered 2018-12-07: 10 mL via INTRAVENOUS
  Filled 2018-12-07: qty 10

## 2018-12-07 MED ORDER — HEPARIN SOD (PORK) LOCK FLUSH 100 UNIT/ML IV SOLN
500.0000 [IU] | Freq: Once | INTRAVENOUS | Status: AC
  Administered 2018-12-07: 500 [IU] via INTRAVENOUS
  Filled 2018-12-07: qty 5

## 2018-12-07 MED ORDER — ZOLEDRONIC ACID 4 MG/100ML IV SOLN
4.0000 mg | Freq: Once | INTRAVENOUS | Status: AC
  Administered 2018-12-07: 4 mg via INTRAVENOUS
  Filled 2018-12-07: qty 100

## 2018-12-07 MED ORDER — ZOLEDRONIC ACID 4 MG/5ML IV CONC
4.0000 mg | Freq: Once | INTRAVENOUS | Status: DC
  Filled 2018-12-07: qty 5

## 2018-12-07 MED ORDER — SODIUM CHLORIDE 0.9 % IV SOLN
INTRAVENOUS | Status: DC
  Administered 2018-12-07: 15:00:00 via INTRAVENOUS
  Filled 2018-12-07: qty 250

## 2018-12-07 NOTE — Progress Notes (Signed)
Survivorship Care Plan visit completed.  Treatment summary reviewed and given to patient.  ASCO answers booklet reviewed and given to patient.  CARE program and Cancer Transitions discussed with patient along with other resources cancer center offers to patients and caregivers.  Patient verbalized understanding.    

## 2018-12-07 NOTE — Telephone Encounter (Signed)
Spoke with [employer] and they will send [place] a box to return monitor. Monitor placed in "Save" bin above Pamela's desk.

## 2018-12-07 NOTE — Progress Notes (Signed)
Patient here for follow up. Complains of nausea x2 weeks, it gets worse in the evening. Antiemetics not helping. Continues to have neuropathy and joint pain.

## 2018-12-07 NOTE — Telephone Encounter (Signed)
Patient has misplaced box to return ZIO monitor Dropped off at office - given to [facility]

## 2018-12-08 NOTE — Progress Notes (Signed)
Hematology/Oncology Follow Up Note [facility] [phone] [phone]  Patient Care Team: Marguerita Merles, MD as PCP - General (Family Medicine) End, Harrell Gave, MD as PCP - Cardiology (Cardiology) Rico Junker, RN as Oncology Nurse Navigator Earlie Server, MD as Consulting Physician (Oncology) Bary Castilla, Forest Gleason, MD (General Surgery) Noreene Filbert, MD as Referring Physician (Radiation Oncology)   Name of the patient: Theresa Chaney  [id number]  1981/10/04   Date of visit: 12/08/18 REASON FOR VISIT Follow up for Assessment prior to chemotherapy treatment of breast cancer  Oncology History 10/11/2017 Diagnosed with [id number]  Neoadjuvant dose dense AC and 1 cycle of Taxol.  Interim image showed no treatment response. 03/19/2018 right mastectomy and right axillary dissection, bilateral salpingo-oophorectomy. ypT3 ypN2 #Negative genetic testing #06/11/2018 Finish adjuvant weekly Taxol x11 Grade 2 neuropathy  #right immediate breast reconstruction with placement of tissue expanders.  Cancer TREATMENT Neoadjuvant ddAC +one dose of Taxol, due to lack of response, surgery was offered. Case was discussed on breast tumor board. 03/19/2018 S/p right mastectomy and right axillary dissection, immediate breast reconstruction with placement of expanders. Also had elective bilateral salpingo-oophorectomy..  # s/p 11 cycles Taxol adjuvantly. Tolerated well.  # She has obtained dental clearance for starting Zometa.  S/p Zometa on 6/3/ 2019 # s/p adjuvant radiation, finished 10/10/2018  INTERVAL HISTORY 37yo female with above oncology history reviewed by me presents for follow-up of management of breast cancer.  Patient has completed right breast reconstruction on 07/20/2018 and s/p adjuvant radiation, finished 10/10/2018.    #Chemotherapy-induced neuropathy, on Lyrica 300 mg.  She feels Lyrica only slightly improved her symptoms. Continues to have foot  numbness after walking certain distance.  On Cymbalta reports no significant improvement so far. She also has been referred to acupuncture clinic and reports acupuncture therapy did not help her neuropathies.  #She recently was evaluated by cardiology for periodic palpitation.  Just finished 14 days course of [place] monitoring. #Bilateral upper extremity swelling, she follows up with lymphedema clinic.  Not better or worse.  # continues to have hot flashes, generalized bone pain.  Generalized bone and joint pain.  Patient has tried narcotics which did not relieve her pain.    Review of Systems  Constitutional: Positive for malaise/fatigue. Negative for chills, fever and weight loss.  HENT: Negative for sore throat.   Eyes: Negative for redness.  Respiratory: Positive for shortness of breath. Negative for cough and wheezing.   Cardiovascular: Positive for palpitations and leg swelling. Negative for chest pain.  Gastrointestinal: Negative for abdominal pain, blood in stool, nausea and vomiting.  Genitourinary: Negative for dysuria.  Musculoskeletal: Positive for joint pain and myalgias.  Skin: Negative for rash.  Neurological: Negative for dizziness, tingling and tremors.  Endo/Heme/Allergies: Does not bruise/bleed easily.  Psychiatric/Behavioral: Negative for hallucinations.    No Known Allergies  Patient Active Problem List   Diagnosis Date Noted  . Shortness of breath 08/23/2018  . Nonischemic cardiomyopathy ([facility] 08/23/2018  . Preprocedural cardiovascular examination 08/23/2018  . Tachycardia 08/23/2018  . Palpitations 08/23/2018  . Estrogen receptor positive status (ER+) 04/04/2018  . Breast cancer of upper-outer quadrant of right female breast ([facility] 03/19/2018  . Family history of cancer   . Malignant neoplasm of overlapping sites of right breast in female, estrogen receptor positive ([facility] 10/19/2017     Past Medical History:  Diagnosis Date  . Anemia   . BRCA negative  11/26/2017  . Breast cancer ([facility] 10/11/2017   Multifocal,  ER positive, PR negative, HER-2 negative. ypT3 ypN2a 8.7 cm, 4/15 nodes  . Cardiomyopathy ([place])    a. 10/2017 Echo: EF 60-65%, no rwma, Gr1 DD, nl RV size/fxn; b. 04/2018 Echo: EF 55-60%, no rwma, Nl RV size/fxn; c. 08/2018 Echo: EF 45%, diff HK, ? HK of antsept wall. Gr1 DD. Mild MR. Mild LAE/RAE. Mod dil RV.   [name] Chronic bronchitis ([place]) 11/2017  . COPD (chronic obstructive pulmonary disease) ([place])    MILD PER CXR  . Depression   . Family history of cancer   . GERD (gastroesophageal reflux disease)   . Headache    MIGRAINES  . Heart murmur    ASYMPTOMATIC  . Personal history of chemotherapy    current for right breast ca     Past Surgical History:  Procedure Laterality Date  . AXILLARY LYMPH NODE DISSECTION Right 03/19/2018   Procedure: AXILLARY LYMPH NODE DISSECTION;  Surgeon: Robert Bellow, MD;  Location: [facility] ORS;  Service: General;  Laterality: Right;  . BREAST BIOPSY Right 10/11/2017   12:30 posterior coil clip invasive mammary carcinoma  . BREAST BIOPSY Right 10/11/2017   11:30 middle depth ribbon clip DCIS  . BREAST BIOPSY Right 10/11/2017   5:30 anterior depth x shape invasive ductal carcinoma  . BREAST RECONSTRUCTION WITH PLACEMENT OF TISSUE EXPANDER AND FLEX HD (ACELLULAR HYDRATED DERMIS) Right 03/19/2018   Procedure: BREAST RECONSTRUCTION WITH PLACEMENT OF TISSUE EXPANDER AND FLEX HD (ACELLULAR HYDRATED DERMIS);  Surgeon: Wallace Going, DO;  Location: [facility];  Service: Plastics;  Laterality: Right;  . LAPAROSCOPIC BILATERAL SALPINGO OOPHERECTOMY Bilateral 03/19/2018   Procedure: LAPAROSCOPIC BILATERAL SALPINGO OOPHORECTOMY;  Surgeon: Benjaman Kindler, MD;  Location: [facility];  Service: Gynecology;  Laterality: Bilateral;  . MASTECTOMY Right 03/2018  . MASTECTOMY W/ SENTINEL NODE BIOPSY Right 03/19/2018   Procedure: MASTECTOMY WITH SENTINEL LYMPH NODE BIOPSY;  Surgeon: Robert Bellow, MD;  Location: [facility]  ORS;  Service: General;  Laterality: Right;  . PORTACATH PLACEMENT Left 10/24/2017   Procedure: INSERTION PORT-A-CATH;  Surgeon: Robert Bellow, MD;  Location: [facility]S;  Service: General;  Laterality: Left;  . REMOVAL OF TISSUE EXPANDER AND PLACEMENT OF IMPLANT Right 07/20/2018   Procedure: REMOVAL OF RIGHT BREAST TISSUE EXPANDER AND PLACEMENT OF IMPLANT;  Surgeon: Wallace Going, DO;  Location: [facility];  Service: Plastics;  Laterality: Right;    Social History   Socioeconomic History  . Marital status: Married    Spouse name: Not on file  . Number of children: Not on file  . Years of education: Not on file  . Highest education level: Not on file  Occupational History  . Occupation: Occupational psychologist    Comment: [employer]  . Financial resource strain: Not on file  . Food insecurity:    Worry: Not on file    Inability: Not on file  . Transportation needs:    Medical: Not on file    Non-medical: Not on file  Tobacco Use  . Smoking status: Former Smoker    Packs/day: 0.50    Years: 18.00    Pack years: 9.00    Types: Cigarettes    Start date: 06/21/2018  . Smokeless tobacco: Never Used  Substance and Sexual Activity  . Alcohol use: No    Frequency: Never  . Drug use: No  . Sexual activity: Yes    Birth control/protection: Injection  Lifestyle  . Physical activity:    Days  per week: Not on file    Minutes per session: Not on file  . Stress: Not on file  Relationships  . Social connections:    Talks on phone: Not on file    Gets together: Not on file    Attends religious service: Not on file    Active member of club or organization: Not on file    Attends meetings of clubs or organizations: Not on file    Relationship status: Not on file  . Intimate partner violence:    Fear of current or ex partner: Not on file    Emotionally abused: Not on file    Physically abused: Not on file    Forced sexual  activity: Not on file  Other Topics Concern  . Not on file  Social History Narrative  . Not on file     Family History  Problem Relation Age of Onset  . Melanoma Maternal Aunt        other aunts with BCC/SCC/Melanoma  . Diabetes Father   . Hypertension Father   . Hyperlipidemia Father   . Heart attack Father 80       "mild"  . Bladder Cancer Maternal Grandmother   . Cervical cancer Maternal Aunt 64       daughter w/ cervical cancer as well  . Melanoma Maternal Uncle        other uncles with BCC/SCC/Melanoma     Current Outpatient Medications:  .  albuterol (PROVENTIL HFA;VENTOLIN HFA) 108 (90 Base) MCG/ACT inhaler, Inhale 2 puffs into the lungs every 6 (six) hours as needed for wheezing or shortness of breath (Cough)., Disp: 1 Inhaler, Rfl: 2 .  ALPRAZolam (XANAX) 0.25 MG tablet, Take 0.25 mg at bedtime as needed by mouth for anxiety or sleep. , Disp: , Rfl:  .  Calcium Carb-Cholecalciferol (CALCIUM 600 + D PO), Take 1 tablet daily by mouth., Disp: , Rfl:  .  chlorhexidine (PERIDEX) 0.12 % solution, USE AS DIRECTED 15MLS IN THE MOUTH OR THROAT TWICE A DAY, Disp: 473 mL, Rfl: 1 .  cyclobenzaprine (FLEXERIL) 5 MG tablet, Take 1 tablet (5 mg total) by mouth 3 (three) times daily as needed for muscle spasms., Disp: 90 tablet, Rfl: 0 .  doxylamine, Sleep, (UNISOM) 25 MG tablet, Take 25 mg by mouth at bedtime as needed for sleep., Disp: , Rfl:  .  DULoxetine (CYMBALTA) 30 MG capsule, Take '30mg'$  dialy x 2 weeks, Then take '30mg'$  BID., Disp: 60 capsule, Rfl: 3 .  esomeprazole (NEXIUM) 40 MG capsule, Take 40 mg by mouth every morning. , Disp: , Rfl:  .  HYDROcodone-acetaminophen (NORCO) 5-325 MG tablet, Take 1 tablet by mouth every 6 (six) hours as needed for moderate pain., Disp: 30 tablet, Rfl: 0 .  ibuprofen (ADVIL,MOTRIN) 800 MG tablet, Take 1 tablet (800 mg total) by mouth every 8 (eight) hours as needed for moderate pain., Disp: 30 tablet, Rfl: 1 .  letrozole (FEMARA) 2.5 MG tablet, Take  1 tablet (2.5 mg total) by mouth daily., Disp: 90 tablet, Rfl: 0 .  loratadine (CLARITIN) 10 MG tablet, Take 10 mg by mouth daily. , Disp: , Rfl:  .  LORazepam (ATIVAN) 1 MG tablet, Take 1 mg by mouth every 8 (eight) hours., Disp: , Rfl:  .  metoprolol succinate (TOPROL XL) 25 MG 24 hr tablet, Take 0.5 tablets (12.5 mg total) by mouth daily., Disp: 45 tablet, Rfl: 3 .  pregabalin (LYRICA) 300 MG capsule, Take 1 capsule (300 mg total)  by mouth daily. (Patient taking differently: Take 150 mg by mouth 2 (two) times daily. ), Disp: 30 capsule, Rfl: 3 .  prochlorperazine (COMPAZINE) 10 MG tablet, Take 1 tablet (10 mg total) by mouth every 6 (six) hours as needed (Nausea or vomiting)., Disp: 30 tablet, Rfl: 1 .  promethazine (PHENERGAN) 25 MG tablet, Take 1 tablet (25 mg total) by mouth every 6 (six) hours as needed for nausea or vomiting., Disp: 30 tablet, Rfl: 0 .  pyridOXINE (VITAMIN B-6) 100 MG tablet, Take 1 tablet (100 mg total) by mouth daily., Disp: 30 tablet, Rfl: 1 .  vitamin C (ASCORBIC ACID) 500 MG tablet, Take 500 mg by mouth daily., Disp: , Rfl:  .  escitalopram (LEXAPRO) 5 MG tablet, Take '15mg'$  daily x 7 days. Then '10mg'$  x 7 days, Then '5mg'$  daily, then stop (Patient not taking: Reported on 12/07/2018), Disp: 45 tablet, Rfl: 0 No current facility-administered medications for this visit.   Facility-Administered Medications Ordered in Other Visits:  .  heparin lock flush 100 unit/mL, 500 Units, Intravenous, Once, Earlie Server, MD .  sodium chloride flush (NS) 0.9 % injection 10 mL, 10 mL, Intravenous, PRN, Earlie Server, MD, 10 mL at 11/19/18 1249   Physical exam:  Vitals:   12/07/18 1315  BP: 116/76  Pulse: 93  Resp: 18  Temp: (!) 96.4 F (35.8 C)  TempSrc: Tympanic  Weight: 199 lb 14.4 oz (90.7 kg)  ECOG 1 Physical Exam  Constitutional: She is oriented to person, place, and time. No distress.  HENT:  Head: Normocephalic and atraumatic.  Mouth/Throat: No oropharyngeal exudate.  Eyes:  Pupils are equal, round, and reactive to light. Conjunctivae and EOM are normal. Left eye exhibits no discharge. No scleral icterus.  Neck: Normal range of motion. Neck supple. No JVD present.  Cardiovascular: Normal rate, regular rhythm and normal heart sounds.  No murmur heard. Pulmonary/Chest: Effort normal and breath sounds normal. No respiratory distress. She has no wheezes. She has no rales. She exhibits no tenderness.  Abdominal: Soft. Bowel sounds are normal. She exhibits no distension and no mass. There is no abdominal tenderness. There is no rebound.  Musculoskeletal: Normal range of motion.        General: No edema.     Comments: Bilateral hand swelling.   Lymphadenopathy:    She has no cervical adenopathy.  Neurological: She is alert and oriented to person, place, and time.  Skin: Skin is warm and dry. No rash noted. She is not diaphoretic. No erythema.  Psychiatric: Affect normal.       CMP Latest Ref Rng & Units 12/07/2018  Glucose 70 - 99 mg/dL 124(H)  BUN 6 - 20 mg/dL 16  Creatinine 0.44 - 1.00 mg/dL 0.77  Sodium 135 - 145 mmol/L 140  Potassium 3.5 - 5.1 mmol/L 3.7  Chloride 98 - 111 mmol/L 106  CO2 22 - 32 mmol/L 26  Calcium 8.9 - 10.3 mg/dL 9.2  Total Protein 6.5 - 8.1 g/dL 7.3  Total Bilirubin 0.3 - 1.2 mg/dL 0.5  Alkaline Phos 38 - 126 U/L 85  AST 15 - 41 U/L 27  ALT 0 - 44 U/L 19   CBC Latest Ref Rng & Units 12/07/2018  WBC 4.0 - 10.5 K/uL 6.0  Hemoglobin 12.0 - 15.0 g/dL 11.8(L)  Hematocrit 36.0 - 46.0 % 36.1  Platelets 150 - 400 K/uL 283   RADIOGRAPHIC STUDIES: I have personally reviewed the radiological images as listed and agreed with the findings in the report.  08/23/2018 DEXA The BMD measured at Femur Neck Right is 0.900 g/cm2 with a T-score of -1.0. This patient is considered normal according to [facility]) criteria. Site Region Measured Measured WHO Young Adult BMD Date       Age      Classification T-score AP Spine  L1-L4 08/23/2018 35.5 Normal -0.8 1.091 g/cm2 DualFemur Neck Right 08/23/2018 35.5 Normal -1.0 0.900 g/cm2  08/15/2018 2D echo Study Conclusions - Left ventricle: The cavity size was mildly dilated. Systolic   function was mildly reduced. The estimated ejection fraction was   45%. Diffuse hypokinesis. Hypokinesis of the anteroseptal   myocardium. Doppler parameters are consistent with abnormal left   ventricular relaxation (grade 1 diastolic dysfunction). - Aortic valve: Valve area (VTI): 1.88 cm^2. Valve area (Vmax): 1.8   cm^2. Valve area (Vmean): 1.74 cm^2. - Mitral valve: There was mild regurgitation. Valve area by   continuity equation (using LVOT flow): 2.22 cm^2. - Left atrium: The atrium was mildly dilated. - Right ventricle: The cavity size was moderately dilated. - Right atrium: The atrium was mildly dilated.  RADIOGRAPHIC STUDIES: I have personally reviewed the radiological images as listed and agreed with the findings in the report. 10/30/2018 Bone scan whole body Non scintigraphic evidence of osseous metastatic disease.  Assessment and plan- Patient is a 37 y.o. female presents for evaluation of newly diagnosed multicentric right breast cancer.  Cancer Staging Breast cancer of upper-outer quadrant of right female breast [facility]) Staging form: Breast, AJCC 8th Edition - Clinical: G2, ER+, PR+, HER2- - Signed by Earlie Server, MD on 04/05/2018 - Pathologic stage from 04/04/2018: No Stage Recommended (ypT3, pN2, cM0, G3, ER+, PR-, HER2-) - Signed by Earlie Server, MD on 04/04/2018    1. Malignant neoplasm of overlapping sites of right breast in female, estrogen receptor positive ([place])   2. Neuropathy due to chemotherapeutic drug ([place])   3. Bilateral hand swelling   4. Aromatase inhibitor use   5. Vasomotor symptoms due to menopause     #Breast cancer, status post bilateral oophorectomy, continue letrozole.   #Discussed with patient that most of her symptoms are vasomotor symptoms  secondary to menopause. Bone density reviewed which showed no evidence of metastatic disease.    #Chemotherapy induced neuropathy, has switched Effexor to Cymbalta.  Reports depression is well controlled on current regimen. Neuropathy has not improved after Cymbalta. Acupuncture therapy did not improve her symptoms as well. I recommend patient to neuropathy clinic for further evaluation.  Continue Lyrica for now. She has been referred to breast cancer survivorship.  #Unilateral mammogram in January 2020. #Proceed with Zometa today #Port flush in 6 weeks. Follow up in 3 months, check cbc, cmp.  We spent sufficient time to discuss many aspect of care, questions were answered to patient's satisfaction. Earlie Server, MD, PhD Hematology Oncology [facility] at [facility] Pager- [phone] 12/08/18

## 2018-12-10 LAB — VITAMIN D 25 HYDROXY (VIT D DEFICIENCY, FRACTURES): Vit D, 25-Hydroxy: 25.3 ng/mL — ABNORMAL LOW (ref 30.0–100.0)

## 2018-12-11 ENCOUNTER — Other Ambulatory Visit: Payer: Self-pay | Admitting: Oncology

## 2018-12-11 ENCOUNTER — Other Ambulatory Visit: Payer: BLUE CROSS/BLUE SHIELD

## 2018-12-11 MED ORDER — ERGOCALCIFEROL 1.25 MG (50000 UT) PO CAPS: 50000.0000 [IU] | ORAL_CAPSULE | ORAL | 0 refills | Status: DC

## 2018-12-11 NOTE — Telephone Encounter (Signed)
Monitor returned in replacement box and placed in outgoing mail.

## 2018-12-12 HISTORY — PX: CARPAL TUNNEL RELEASE: SHX101

## 2018-12-13 ENCOUNTER — Encounter: Payer: Self-pay | Admitting: *Deleted

## 2018-12-21 ENCOUNTER — Telehealth: Payer: Self-pay

## 2018-12-21 NOTE — Telephone Encounter (Signed)
Call to pt to discuss results of zio monitor. Pt reports that she does feel palpitations but they are not "bothering her" at this time. I encouraged her to call back if that changed.  Advised pt to call for any further questions or concerns

## 2018-12-21 NOTE — Telephone Encounter (Signed)
-----   Message from Nelva Bush, MD sent at 12/21/2018  4:21 PM EST ----- Please let Theresa Chaney know that her monitor showed a few extra beats but no significant arrhythmias.  If she continues to have palpitations that are bothering her, she could try increasing metoprolol succinate to 25 mg daily.  I do not recommend any other medications or additional testing at this time.

## 2018-12-28 ENCOUNTER — Other Ambulatory Visit: Payer: BLUE CROSS/BLUE SHIELD

## 2018-12-31 NOTE — Progress Notes (Deleted)
Cardiology Office Note Date:  12/31/2018  Patient ID:  Zakya, Halabi 1981-11-04, MRN [id number] PCP:  Marguerita Merles, MD  Cardiologist:  Dr. Saunders Revel, MD  ***refresh   Chief Complaint: Follow up  History of Present Illness: Na Waldrip is a 38 y.o. female with history of right-sided breast cancer status post mastectomy and chemoradiation complicated by mild cardiomyopathy who presents for follow-up of palpitations.  Patient was previously evaluated by cardiology in the setting of intermittent sinus tachycardia and dyspnea on exertion.  Echocardiograms in 10/2017 and 04/2018 showed normal LV systolic function.  It was ultimately felt that her symptoms were secondary to her chemotherapy.  More recently, she experienced upper extremity swelling with repeat echocardiogram in 08/2018 demonstrating an EF of 45% with question of anteroseptal hypokinesis and moderate RV enlargement.  Upon primary cardiologist review of echocardiogram that was felt her EF was closer to 50 to 55%.  She was placed on Toprol-XL.  She underwent Lexiscan Myoview on 10/12/2018 was suboptimal due to breast attenuation and extracardiac activity.  There was a moderate in size, moderate in severity, basal and mid anterior defect most consistent with artifact.  LVEF 55 to 65%.  She was last seen in the office on 11/19/2018 and was overall feeling well outside of intermittent palpitations and elevated heart rates over the prior 1 to 2 weeks.  She noted her heart rate will sometimes abruptly jump close to 150 bpm.  There was associated shortness of breath.  Symptoms typically lasted a few minutes.  In this setting, she underwent outpatient cardiac monitoring which showed the predominant rhythm was sinus with an average rate of 82 bpm with a range of 48 to 175 bpm.  Rare PACs and PVCs were noted with a single atrial run lasting 4 beats.  There were no sustained arrhythmias or prolonged pauses.  Patient triggered events corresponded to sinus  rhythm and artifact.  ***   Past Medical History:  Diagnosis Date  . Anemia   . BRCA negative 11/26/2017  . Breast cancer ([place]) 10/11/2017   Multifocal, ER positive, PR negative, HER-2 negative. ypT3 ypN2a 8.7 cm, 4/15 nodes  . Cardiomyopathy ([place])    a. 10/2017 Echo: EF 60-65%, no rwma, Gr1 DD, nl RV size/fxn; b. 04/2018 Echo: EF 55-60%, no rwma, Nl RV size/fxn; c. 08/2018 Echo: EF 45%, diff HK, ? HK of antsept wall. Gr1 DD. Mild MR. Mild LAE/RAE. Mod dil RV.   [name] Chronic bronchitis ([place]) 11/2017  . COPD (chronic obstructive pulmonary disease) ([place])    MILD PER CXR  . Depression   . Family history of cancer   . GERD (gastroesophageal reflux disease)   . Headache    MIGRAINES  . Heart murmur    ASYMPTOMATIC  . Personal history of chemotherapy    current for right breast ca    Past Surgical History:  Procedure Laterality Date  . AXILLARY LYMPH NODE DISSECTION Right 03/19/2018   Procedure: AXILLARY LYMPH NODE DISSECTION;  Surgeon: Robert Bellow, MD;  Location: [facility] ORS;  Service: General;  Laterality: Right;  . BREAST BIOPSY Right 10/11/2017   12:30 posterior coil clip invasive mammary carcinoma  . BREAST BIOPSY Right 10/11/2017   11:30 middle depth ribbon clip DCIS  . BREAST BIOPSY Right 10/11/2017   5:30 anterior depth x shape invasive ductal carcinoma  . BREAST RECONSTRUCTION WITH PLACEMENT OF TISSUE EXPANDER AND FLEX HD (ACELLULAR HYDRATED DERMIS) Right 03/19/2018   Procedure: BREAST RECONSTRUCTION WITH PLACEMENT OF TISSUE EXPANDER AND FLEX  HD (ACELLULAR HYDRATED DERMIS);  Surgeon: Wallace Going, DO;  Location: [facility];  Service: Plastics;  Laterality: Right;  . LAPAROSCOPIC BILATERAL SALPINGO OOPHERECTOMY Bilateral 03/19/2018   Procedure: LAPAROSCOPIC BILATERAL SALPINGO OOPHORECTOMY;  Surgeon: Benjaman Kindler, MD;  Location: [facility];  Service: Gynecology;  Laterality: Bilateral;  . MASTECTOMY Right 03/2018  . MASTECTOMY W/ SENTINEL NODE BIOPSY Right 03/19/2018    Procedure: MASTECTOMY WITH SENTINEL LYMPH NODE BIOPSY;  Surgeon: Robert Bellow, MD;  Location: [facility];  Service: General;  Laterality: Right;  . PORTACATH PLACEMENT Left 10/24/2017   Procedure: INSERTION PORT-A-CATH;  Surgeon: Robert Bellow, MD;  Location: [facility];  Service: General;  Laterality: Left;  . REMOVAL OF TISSUE EXPANDER AND PLACEMENT OF IMPLANT Right 07/20/2018   Procedure: REMOVAL OF RIGHT BREAST TISSUE EXPANDER AND PLACEMENT OF IMPLANT;  Surgeon: Wallace Going, DO;  Location: [facility];  Service: Plastics;  Laterality: Right;    No outpatient medications have been marked as taking for the 01/01/19 encounter (Appointment) with Rise Mu, PA-C.    Allergies:   Patient has no known allergies.   Social History:  The patient  reports that she has quit smoking. Her smoking use included cigarettes. She started smoking about 6 months ago. She has a 9.00 pack-year smoking history. She has never used smokeless tobacco. She reports that she does not drink alcohol or use drugs.   Family History:  The patient's family history includes Bladder Cancer in her maternal grandmother; Cervical cancer (age of onset: 66) in her maternal aunt; Diabetes in her father; Heart attack (age of onset: 87) in her father; Hyperlipidemia in her father; Hypertension in her father; Melanoma in her maternal aunt and maternal uncle.  ROS:   ROS   PHYSICAL EXAM: *** VS:  LMP  (LMP Unknown) Comment: Ovaries and tubes removed. BMI: There is no height or weight on file to calculate BMI.  Physical Exam   EKG:  Was ordered and interpreted by me today. Shows ***  Recent Labs: 04/23/2018: TSH 1.505 12/07/2018: ALT 19; BUN 16; Creatinine, Ser 0.77; Hemoglobin 11.8; Platelets 283; Potassium 3.7; Sodium 140  No results found for requested labs within last 8760 hours.   CrCl cannot be calculated (Patient's most recent lab result is older than the maximum 21 days allowed.).   Wt  Readings from Last 3 Encounters:  12/07/18 199 lb 14.4 oz (90.7 kg)  11/19/18 197 lb 4 oz (89.5 kg)  11/05/18 (P) 192 lb 5.6 oz (87.2 kg)     Other studies reviewed: Additional studies/records reviewed today include: summarized above  ASSESSMENT AND PLAN:  1. ***  Disposition: F/u with Dr. Saunders Revel or an APP in ***  Current medicines are reviewed at length with the patient today.  The patient did not have any concerns regarding medicines.  Signed, Christell Faith, PA-C 12/31/2018 7:54 AM     [facility] [address] [place], [place] [address] [phone]

## 2019-01-01 ENCOUNTER — Ambulatory Visit: Payer: BLUE CROSS/BLUE SHIELD | Admitting: Physician Assistant

## 2019-01-03 ENCOUNTER — Encounter: Payer: Self-pay | Admitting: Oncology

## 2019-01-03 MED ORDER — METOPROLOL SUCCINATE ER 25 MG PO TB24: 25.0000 mg | ORAL_TABLET | Freq: Every day | ORAL | 3 refills | Status: DC

## 2019-01-03 NOTE — Addendum Note (Signed)
Addended by: Vanessa Ralphs on: 01/03/2019 04:46 PM   Modules accepted: Orders

## 2019-01-07 ENCOUNTER — Other Ambulatory Visit: Payer: Self-pay | Admitting: *Deleted

## 2019-01-07 DIAGNOSIS — G629 Polyneuropathy, unspecified: Secondary | ICD-10-CM

## 2019-01-14 ENCOUNTER — Inpatient Hospital Stay: Payer: Managed Care, Other (non HMO)

## 2019-01-17 ENCOUNTER — Inpatient Hospital Stay: Payer: Managed Care, Other (non HMO) | Attending: Oncology

## 2019-01-17 DIAGNOSIS — Z452 Encounter for adjustment and management of vascular access device: Secondary | ICD-10-CM | POA: Diagnosis present

## 2019-01-17 DIAGNOSIS — Z95828 Presence of other vascular implants and grafts: Secondary | ICD-10-CM

## 2019-01-17 DIAGNOSIS — C50411 Malignant neoplasm of upper-outer quadrant of right female breast: Secondary | ICD-10-CM | POA: Insufficient documentation

## 2019-01-17 MED ORDER — HEPARIN SOD (PORK) LOCK FLUSH 100 UNIT/ML IV SOLN
500.0000 [IU] | Freq: Once | INTRAVENOUS | Status: AC
  Administered 2019-01-17: 500 [IU] via INTRAVENOUS
  Filled 2019-01-17: qty 5

## 2019-01-17 MED ORDER — SODIUM CHLORIDE 0.9% FLUSH
10.0000 mL | Freq: Once | INTRAVENOUS | Status: AC
  Administered 2019-01-17: 10 mL via INTRAVENOUS
  Filled 2019-01-17: qty 10

## 2019-01-21 NOTE — Progress Notes (Signed)
Cardiology Office Note Date:  01/22/2019  Patient ID:  Theresa, Chaney [birth year], MRN [id number] PCP:  Marguerita Merles, MD  Cardiologist:  Dr. Saunders Revel, MD    Chief Complaint: Follow up  History of Present Illness: Theresa Chaney is a 38 y.o. female with history of breast Theresa Chaney s/p mastectomy and chemoradiation complicated by a mildly reduced LVEF, COPD, migraine disorder, and GERD who presents for follow up of palpitations.   She was previously evaluated in 10/2017 for intermittent sinus tachycardia and DOE. At that time echoes in 10/2017 and 04/2018 showed normal LV function and it was ultimately felt her symptoms were secondary to chemotherapy. In 08/2018, she experienced upper extremity swelling with repeat echo at that time showing an EF of 45% with question of anteroseptal hypokinesis and moderate right ventricular enlargement. Upon her primary cardiologist reviewing the echo, it was felt her EF was closer to 50-55%. She was placed on Toprol XL. Lexiscan Myoview in 10/2018 showed a medium defect of moderate severity present in the the basal anterior and mild anterior location, EF 55-65%. This was a very suboptimal study due to breast attenuation artifact as well as extracardiac uptake at the inferior border of the heart. There was reversible anterior wall defect suggestive of ischemia, though it was felt this was likely artifact given the defect did not extend to the apex.   She was last seen in the office in 11/2018 for follow up and was feeling well outside of intermittent palpitations over the prior couple of weeks with report of heart rates of 150 bpm. Episodes would typically last a few minutes. In this setting, she wore a Zio monitor that showed the predominant rhythm was sinus with an average heart rate of 82 bpm (range 48-175 bpm). Rare PACs and PVCs were noted with a single atrial run lasting 4 beats. There were no sustained arrhythmias or prolonged pauses. Patient triggered events  corresponded to sinus rhythm and artifact. She has since increased her Toprol XL to 25 mg daily in the setting of increased palpitations.   Labs: 11/2018 - K+ 3.7, SCr 0.77, LFT normal, HGB 11.8 04/2018 - TSH normal  She comes in today continuing to note palpitations on a daily basis, occurring multiple times per day.  Palpitations are typically short-lived lasting a minute or 2 and spontaneously resolving.  She notes the palpitations will occur at any point in time during the day including with activity however they are more noticeable at nighttime when she is laying down trying to sleep.  She does note some shortness of breath though feels like this may be related to her focusing on her breathing in an effort to improve the palpitations.  There is no associated chest pain, dizziness, presyncope, or syncope.  She does not feel like metoprolol has helped significantly.  She reports the palpitations appear to be more frequent than when she wore her outpatient cardiac monitoring in 11/2018.  She reports drinking 2 to 3 glasses of tea and typically 1 soda per day.  She does not drink much water as she does not like it.  She denies any tobacco, alcohol, or illegal drug use.  She continues to note stable, mild bilateral upper extremity swelling.  Of note, she had 17 lymph nodes removed along the right side at the time of her mastectomy.  She reports her father has a history of hypertension, diabetes, and CAD with an MI in his mid 28s.  She was not close with her mother and  had not spoken with her in 20+ years though her mother did pass away on 01/21/2019 from "Theresa Chaney."  Past Medical History:  Diagnosis Date  . Anemia   . BRCA negative 11/26/2017  . Breast Theresa Chaney ([place]) 10/11/2017   Multifocal, ER positive, PR negative, HER-2 negative. ypT3 ypN2a 8.7 cm, 4/15 nodes  . Cardiomyopathy ([place])    a. 10/2017 Echo: EF 60-65%, no rwma, Gr1 DD, nl RV size/fxn; b. 04/2018 Echo: EF 55-60%, no rwma, Nl RV size/fxn; c.  08/2018 Echo: EF 45%, diff HK, ? HK of antsept wall. Gr1 DD. Mild MR. Mild LAE/RAE. Mod dil RV.   [name] Chronic bronchitis ([place]) 11/2017  . COPD (chronic obstructive pulmonary disease) ([place])    MILD PER CXR  . Depression   . Family history of Theresa Chaney   . GERD (gastroesophageal reflux disease)   . Headache    MIGRAINES  . Heart murmur    ASYMPTOMATIC  . Personal history of chemotherapy    current for right breast ca    Past Surgical History:  Procedure Laterality Date  . AXILLARY LYMPH NODE DISSECTION Right 03/19/2018   Procedure: AXILLARY LYMPH NODE DISSECTION;  Surgeon: Robert Bellow, MD;  Location: [facility];  Service: General;  Laterality: Right;  . BREAST BIOPSY Right 10/11/2017   12:30 posterior coil clip invasive mammary carcinoma  . BREAST BIOPSY Right 10/11/2017   11:30 middle depth ribbon clip DCIS  . BREAST BIOPSY Right 10/11/2017   5:30 anterior depth x shape invasive ductal carcinoma  . BREAST RECONSTRUCTION WITH PLACEMENT OF TISSUE EXPANDER AND FLEX HD (ACELLULAR HYDRATED DERMIS) Right 03/19/2018   Procedure: BREAST RECONSTRUCTION WITH PLACEMENT OF TISSUE EXPANDER AND FLEX HD (ACELLULAR HYDRATED DERMIS);  Surgeon: Wallace Going, DO;  Location: [facility];  Service: Plastics;  Laterality: Right;  . LAPAROSCOPIC BILATERAL SALPINGO OOPHERECTOMY Bilateral 03/19/2018   Procedure: LAPAROSCOPIC BILATERAL SALPINGO OOPHORECTOMY;  Surgeon: Benjaman Kindler, MD;  Location: [facility];  Service: Gynecology;  Laterality: Bilateral;  . MASTECTOMY Right 03/2018  . MASTECTOMY W/ SENTINEL NODE BIOPSY Right 03/19/2018   Procedure: MASTECTOMY WITH SENTINEL LYMPH NODE BIOPSY;  Surgeon: Robert Bellow, MD;  Location: [facility];  Service: General;  Laterality: Right;  . PORTACATH PLACEMENT Left 10/24/2017   Procedure: INSERTION PORT-A-CATH;  Surgeon: Robert Bellow, MD;  Location: [facility];  Service: General;  Laterality: Left;  . REMOVAL OF TISSUE EXPANDER AND PLACEMENT OF IMPLANT Right  07/20/2018   Procedure: REMOVAL OF RIGHT BREAST TISSUE EXPANDER AND PLACEMENT OF IMPLANT;  Surgeon: Wallace Going, DO;  Location: [facility];  Service: Plastics;  Laterality: Right;    Current Meds  Medication Sig  . albuterol (PROVENTIL HFA;VENTOLIN HFA) 108 (90 Base) MCG/ACT inhaler Inhale 2 puffs into the lungs every 6 (six) hours as needed for wheezing or shortness of breath (Cough).  . ALPRAZolam (XANAX) 0.25 MG tablet Take 0.25 mg at bedtime as needed by mouth for anxiety or sleep.   . Calcium Carb-Cholecalciferol (CALCIUM 600 + D PO) Take 1 tablet daily by mouth.  . chlorhexidine (PERIDEX) 0.12 % solution USE AS DIRECTED 15MLS IN THE MOUTH OR THROAT TWICE A DAY  . cyclobenzaprine (FLEXERIL) 5 MG tablet Take 1 tablet (5 mg total) by mouth 3 (three) times daily as needed for muscle spasms.  [name] doxylamine, Sleep, (UNISOM) 25 MG tablet Take 25 mg by mouth at bedtime as needed for sleep.  . DULoxetine (CYMBALTA) 30 MG capsule Take 54m dialy x 2 weeks, Then take  44m BID.  .Marland Kitchenergocalciferol (VITAMIN D2) 1.25 MG (50000 UT) capsule Take 1 capsule (50,000 Units total) by mouth once a week.  . esomeprazole (NEXIUM) 40 MG capsule Take 40 mg by mouth every morning.   .Marland KitchenHYDROcodone-acetaminophen (NORCO) 5-325 MG tablet Take 1 tablet by mouth every 6 (six) hours as needed for moderate pain.  .Marland Kitchenibuprofen (ADVIL,MOTRIN) 800 MG tablet Take 1 tablet (800 mg total) by mouth every 8 (eight) hours as needed for moderate pain.  .Marland Kitchenletrozole (FEMARA) 2.5 MG tablet Take 1 tablet (2.5 mg total) by mouth daily.  .Marland Kitchenloratadine (CLARITIN) 10 MG tablet Take 10 mg by mouth daily.   .Marland KitchenLORazepam (ATIVAN) 1 MG tablet Take 1 mg by mouth every 8 (eight) hours.  . metoprolol succinate (TOPROL XL) 25 MG 24 hr tablet Take 1 tablet (25 mg total) by mouth daily.  . pregabalin (LYRICA) 300 MG capsule Take 1 capsule (300 mg total) by mouth daily. (Patient taking differently: Take 150 mg by mouth 2 (two) times  daily. )  . prochlorperazine (COMPAZINE) 10 MG tablet Take 1 tablet (10 mg total) by mouth every 6 (six) hours as needed (Nausea or vomiting).  . promethazine (PHENERGAN) 25 MG tablet Take 1 tablet (25 mg total) by mouth every 6 (six) hours as needed for nausea or vomiting.  . pyridOXINE (VITAMIN B-6) 100 MG tablet Take 1 tablet (100 mg total) by mouth daily.  . vitamin C (ASCORBIC ACID) 500 MG tablet Take 500 mg by mouth daily.    Allergies:   Patient has no known allergies.   Social History:  The patient  reports that she has quit smoking. Her smoking use included cigarettes. She started smoking about 7 months ago. She has a 9.00 pack-year smoking history. She has never used smokeless tobacco. She reports that she does not drink alcohol or use drugs.   Family History:  The patient's family history includes Bladder Theresa Chaney in her maternal grandmother; Cervical Theresa Chaney (age of onset: 645 in her maternal aunt; Diabetes in her father; Heart attack (age of onset: 515 in her father; Hyperlipidemia in her father; Hypertension in her father; Melanoma in her maternal aunt and maternal uncle.  ROS:   Review of Systems  Constitutional: Positive for malaise/fatigue. Negative for chills, diaphoresis, fever and weight loss.  HENT: Negative for congestion.   Eyes: Negative for discharge and redness.  Respiratory: Positive for shortness of breath. Negative for cough, hemoptysis, sputum production and wheezing.   Cardiovascular: Positive for palpitations. Negative for chest pain, orthopnea, claudication, leg swelling and PND.  Gastrointestinal: Negative for abdominal pain, blood in stool, heartburn, melena, nausea and vomiting.  Genitourinary: Negative for hematuria.  Musculoskeletal: Negative for falls and myalgias.  Skin: Negative for rash.  Neurological: Positive for weakness. Negative for dizziness, tingling, tremors, sensory change, speech change, focal weakness and loss of consciousness.    Endo/Heme/Allergies: Does not bruise/bleed easily.  Psychiatric/Behavioral: Negative for substance abuse. The patient is not nervous/anxious.   All other systems reviewed and are negative.    PHYSICAL EXAM:  VS:  BP 118/78 (BP Location: Left Arm, Patient Position: Sitting, Cuff Size: Normal)   Pulse 96   Ht 5' 7"  (1.702 m)   Wt 194 lb 8 oz (88.2 kg)   LMP  (LMP Unknown) Comment: Ovaries and tubes removed.  BMI 30.46 kg/m  BMI: Body mass index is 30.46 kg/m.  Physical Exam  Constitutional: She is oriented to person, place, and time. She appears well-developed and well-nourished.  HENT:  Head: Normocephalic and atraumatic.  Eyes: Right eye exhibits no discharge. Left eye exhibits no discharge.  Neck: Normal range of motion. No JVD present.  Cardiovascular: Normal rate, regular rhythm, S1 normal, S2 normal and normal heart sounds. Exam reveals no distant heart sounds, no friction rub, no midsystolic click and no opening snap.  No murmur heard. Pulses:      Posterior tibial pulses are 2+ on the right side and 2+ on the left side.  Pulmonary/Chest: Effort normal and breath sounds normal. No respiratory distress. She has no decreased breath sounds. She has no wheezes. She has no rales. She exhibits no tenderness.  Abdominal: Soft. She exhibits no distension. There is no abdominal tenderness.  Musculoskeletal:        General: No edema.  Neurological: She is alert and oriented to person, place, and time.  Skin: Skin is warm and dry. No cyanosis. Nails show no clubbing.  Psychiatric: She has a normal mood and affect. Her speech is normal and behavior is normal. Judgment and thought content normal.     EKG:  Was ordered and interpreted by me today. Shows NSR, 98 bpm, normal axis, no acute ST-T changes  Recent Labs: 04/23/2018: TSH 1.505 12/07/2018: ALT 19; BUN 16; Creatinine, Ser 0.77; Hemoglobin 11.8; Platelets 283; Potassium 3.7; Sodium 140  No results found for requested labs within  last 8760 hours.   CrCl cannot be calculated (Patient's most recent lab result is older than the maximum 21 days allowed.).   Wt Readings from Last 3 Encounters:  01/22/19 194 lb 8 oz (88.2 kg)  12/07/18 199 lb 14.4 oz (90.7 kg)  11/19/18 197 lb 4 oz (89.5 kg)     Other studies reviewed: Additional studies/records reviewed today include: summarized above  ASSESSMENT AND PLAN:  1. Palpitations/paroxysmal SVT: Continues to note multiple episodes of daily palpitations.  Increase Toprol-XL to 37.5 mg daily.  I do suspect further titration of her beta-blocker will be limited by her blood pressure.  Could consider transition to calcium channel blocker if palpitations persist with escalation of beta-blocker.  Check TSH, CMP, magnesium, and CBC.  If she continues to exhibit significant palpitation burden, consider repeat outpatient cardiac monitoring to demonstrate for escalation in ectopy burden.  If she is found to have significant breakthrough arrhythmia when compared to prior outpatient cardiac monitoring could consider referral to EP for SVT ablation.  2. Nonischemic cardiomyopathy: She appears euvolemic on exam today.  Her weight is down 3 pounds from our last office visit.  Given her increase in palpitation burden and upper extremity swelling, we will repeat a limited echo to evaluate her EF.  Continue Toprol XL as above.  Disposition: F/u with Dr. Saunders Revel or an APP in 4 weeks.  Current medicines are reviewed at length with the patient today.  The patient did not have any concerns regarding medicines.  Signed, Christell Faith, PA-C 01/22/2019 8:45 AM     [address] [place], [place] [address] [phone]

## 2019-01-22 ENCOUNTER — Encounter: Payer: Self-pay | Admitting: Physician Assistant

## 2019-01-22 ENCOUNTER — Ambulatory Visit (INDEPENDENT_AMBULATORY_CARE_PROVIDER_SITE_OTHER): Payer: Managed Care, Other (non HMO) | Admitting: Physician Assistant

## 2019-01-22 VITALS — BP 118/78 | HR 96 | Ht 67.0 in | Wt 194.5 lb

## 2019-01-22 DIAGNOSIS — R0602 Shortness of breath: Secondary | ICD-10-CM | POA: Diagnosis not present

## 2019-01-22 DIAGNOSIS — R002 Palpitations: Secondary | ICD-10-CM | POA: Diagnosis not present

## 2019-01-22 DIAGNOSIS — I471 Supraventricular tachycardia, unspecified: Secondary | ICD-10-CM

## 2019-01-22 DIAGNOSIS — I428 Other cardiomyopathies: Secondary | ICD-10-CM | POA: Diagnosis not present

## 2019-01-22 MED ORDER — METOPROLOL SUCCINATE ER 25 MG PO TB24: 37.5000 mg | ORAL_TABLET | Freq: Every day | ORAL | 3 refills | Status: DC

## 2019-01-22 NOTE — Patient Instructions (Signed)
Medication Instructions:  Your physician has recommended you make the following change in your medication: 1- INCREASE Toprol to 1.5 tablets (37.5 mg total) once daily   If you need a refill on your cardiac medications before your next appointment, please call your pharmacy.   Lab work: Your physician recommends that you return for lab work today (CMET, TSH, Mag, CBC)   If you have labs (blood work) drawn today and your tests are completely normal, you will receive your results only by: [name] MyChart Message (if you have MyChart) OR . A paper copy in the mail If you have any lab test that is abnormal or we need to change your treatment, we will call you to review the results.  Testing/Procedures: 1- Limited Echo Your physician has requested that you have an echocardiogram. Echocardiography is a painless test that uses sound waves to create images of your heart. It provides your doctor with information about the size and shape of your heart and how well your heart's chambers and valves are working. This procedure takes approximately one hour. There are no restrictions for this procedure.   Follow-Up: At [facility], you and your health needs are our priority.  As part of our continuing mission to provide you with exceptional heart care, we have created designated Provider Care Teams.  These Care Teams include your primary Cardiologist (physician) and Advanced Practice Providers (APPs -  Physician Assistants and Nurse Practitioners) who all work together to provide you with the care you need, when you need it. You will need a follow up appointment in 4 weeks. You may see Nelva Bush, MD or Christell Faith, PA-C

## 2019-01-23 ENCOUNTER — Telehealth: Payer: Self-pay

## 2019-01-23 LAB — COMPREHENSIVE METABOLIC PANEL
ALT: 20 IU/L (ref 0–32)
AST: 19 IU/L (ref 0–40)
Albumin/Globulin Ratio: 1.8 (ref 1.2–2.2)
Albumin: 4.8 g/dL (ref 3.8–4.8)
Alkaline Phosphatase: 106 IU/L (ref 39–117)
BUN/Creatinine Ratio: 18 (ref 9–23)
BUN: 17 mg/dL (ref 6–20)
CHLORIDE: 102 mmol/L (ref 96–106)
CO2: 19 mmol/L — ABNORMAL LOW (ref 20–29)
Calcium: 9.9 mg/dL (ref 8.7–10.2)
Creatinine, Ser: 0.94 mg/dL (ref 0.57–1.00)
GFR calc Af Amer: 91 mL/min/{1.73_m2} (ref >59)
GFR calc non Af Amer: 79 mL/min/{1.73_m2} (ref >59)
Globulin, Total: 2.6 g/dL (ref 1.5–4.5)
Glucose: 116 mg/dL — ABNORMAL HIGH (ref 65–99)
Potassium: 4.1 mmol/L (ref 3.5–5.2)
Sodium: 140 mmol/L (ref 134–144)
Total Protein: 7.4 g/dL (ref 6.0–8.5)

## 2019-01-23 LAB — CBC
Hematocrit: 37.7 % (ref 34.0–46.6)
Hemoglobin: 12.9 g/dL (ref 11.1–15.9)
MCH: 29.3 pg (ref 26.6–33.0)
MCHC: 34.2 g/dL (ref 31.5–35.7)
MCV: 86 fL (ref 79–97)
Platelets: 334 10*3/uL (ref 150–450)
RBC: 4.4 x10E6/uL (ref 3.77–5.28)
RDW: 12.5 % (ref 11.7–15.4)
WBC: 6.1 10*3/uL (ref 3.4–10.8)

## 2019-01-23 LAB — MAGNESIUM: Magnesium: 2.1 mg/dL (ref 1.6–2.3)

## 2019-01-23 LAB — TSH: TSH: 0.701 u[IU]/mL (ref 0.450–4.500)

## 2019-01-23 NOTE — Telephone Encounter (Signed)
-----   Message from Rise Mu, PA-C sent at 01/23/2019 10:21 AM EST ----- Random glucose is ok.  Renal function is normal.  Potassium is at goal.  Liver function is normal.  Thyroid function is normal.  Magnesium is at goal.  Blood count is normal.   No laboratory abnormalities to account for the patient's symptoms.

## 2019-01-23 NOTE — Telephone Encounter (Signed)
incoming call from patient. Went over lab work. She verbalized understanding and had no further questions at this time.  No new orders placed.   Advised pt to call for any further questions or concerns.

## 2019-01-23 NOTE — Telephone Encounter (Signed)
Attempted to call patient. LMTCB 2/12

## 2019-02-11 ENCOUNTER — Encounter: Payer: Self-pay | Admitting: Oncology

## 2019-02-14 ENCOUNTER — Ambulatory Visit (INDEPENDENT_AMBULATORY_CARE_PROVIDER_SITE_OTHER): Payer: Managed Care, Other (non HMO) | Admitting: General Surgery

## 2019-02-14 ENCOUNTER — Encounter: Payer: Self-pay | Admitting: General Surgery

## 2019-02-14 ENCOUNTER — Other Ambulatory Visit: Payer: Self-pay

## 2019-02-14 ENCOUNTER — Inpatient Hospital Stay: Payer: Managed Care, Other (non HMO) | Attending: Oncology | Admitting: Oncology

## 2019-02-14 ENCOUNTER — Encounter: Payer: Self-pay | Admitting: Oncology

## 2019-02-14 VITALS — BP 127/81 | HR 96 | Temp 98.6°F | Resp 16 | Ht 67.0 in | Wt 193.6 lb

## 2019-02-14 VITALS — BP 113/74 | HR 96 | Temp 96.1°F | Resp 18 | Wt 195.1 lb

## 2019-02-14 DIAGNOSIS — C50811 Malignant neoplasm of overlapping sites of right female breast: Secondary | ICD-10-CM | POA: Insufficient documentation

## 2019-02-14 DIAGNOSIS — Z87891 Personal history of nicotine dependence: Secondary | ICD-10-CM | POA: Insufficient documentation

## 2019-02-14 DIAGNOSIS — Z9221 Personal history of antineoplastic chemotherapy: Secondary | ICD-10-CM | POA: Diagnosis not present

## 2019-02-14 DIAGNOSIS — Z90722 Acquired absence of ovaries, bilateral: Secondary | ICD-10-CM | POA: Diagnosis not present

## 2019-02-14 DIAGNOSIS — G620 Drug-induced polyneuropathy: Secondary | ICD-10-CM | POA: Insufficient documentation

## 2019-02-14 DIAGNOSIS — Z170 Estrogen receptor positive status [ER+]: Secondary | ICD-10-CM | POA: Diagnosis not present

## 2019-02-14 DIAGNOSIS — Z923 Personal history of irradiation: Secondary | ICD-10-CM | POA: Diagnosis not present

## 2019-02-14 DIAGNOSIS — F329 Major depressive disorder, single episode, unspecified: Secondary | ICD-10-CM | POA: Insufficient documentation

## 2019-02-14 DIAGNOSIS — N644 Mastodynia: Secondary | ICD-10-CM | POA: Diagnosis not present

## 2019-02-14 DIAGNOSIS — Z79811 Long term (current) use of aromatase inhibitors: Secondary | ICD-10-CM | POA: Diagnosis not present

## 2019-02-14 DIAGNOSIS — Z9011 Acquired absence of right breast and nipple: Secondary | ICD-10-CM | POA: Diagnosis not present

## 2019-02-14 DIAGNOSIS — Z95828 Presence of other vascular implants and grafts: Secondary | ICD-10-CM

## 2019-02-14 DIAGNOSIS — G629 Polyneuropathy, unspecified: Secondary | ICD-10-CM

## 2019-02-14 DIAGNOSIS — N951 Menopausal and female climacteric states: Secondary | ICD-10-CM

## 2019-02-14 MED ORDER — ESCITALOPRAM OXALATE 10 MG PO TABS: 10.0000 mg | ORAL_TABLET | Freq: Every day | ORAL | 2 refills | Status: DC

## 2019-02-14 MED ORDER — PROMETHAZINE HCL 25 MG PO TABS: 25.0000 mg | ORAL_TABLET | Freq: Four times a day (QID) | ORAL | 3 refills | Status: DC | PRN

## 2019-02-14 NOTE — Patient Instructions (Addendum)
The patient is aware to call back for any questions or new concerns.  Follow up pending appointment with Dr Marla Roe.

## 2019-02-14 NOTE — Progress Notes (Signed)
Patient here for follow up. Requesting refill on promethazine. Still complains of pain to hands.

## 2019-02-14 NOTE — Progress Notes (Signed)
Hematology/Oncology Follow Up Note [facility] Telephone:([phone] [phone]  Patient Care Team: Marguerita Merles, MD as PCP - General (Family Medicine) End, Harrell Gave, MD as PCP - Cardiology (Cardiology) Rico Junker, RN as Oncology Nurse Navigator Earlie Server, MD as Consulting Physician (Oncology) Bary Castilla, Forest Gleason, MD (General Surgery) Noreene Filbert, MD as Referring Physician (Radiation Oncology)   Name of the patient: Theresa Chaney  [id number]  1981/12/06   Date of visit: 02/14/19 REASON FOR VISIT Follow up for Assessment prior to chemotherapy treatment of breast cancer  Oncology History 10/11/2017 Diagnosed with cT3mcN0  Neoadjuvant dose dense AC and 1 cycle of Taxol.  Interim image showed no treatment response. 03/19/2018 right mastectomy and right axillary dissection, bilateral salpingo-oophorectomy. ypT3 ypN2 #Negative genetic testing #06/11/2018 Finish adjuvant weekly Taxol x11 Grade 2 neuropathy  #right immediate breast reconstruction with placement of tissue expanders.  Cancer TREATMENT Neoadjuvant ddAC +one dose of Taxol, due to lack of response, surgery was offered. Case was discussed on breast tumor board. 03/19/2018 S/p right mastectomy and right axillary dissection, immediate breast reconstruction with placement of expanders. Also had elective bilateral salpingo-oophorectomy..  # s/p 11 cycles Taxol adjuvantly. Tolerated well.  # She has obtained dental clearance for starting Zometa.  S/p Zometa on 6/3/ 2019 # s/p adjuvant radiation, finished 10/10/2018  INTERVAL HISTORY 38yo female with above oncology history reviewed by me presents for follow-up of management of breast cancer.  Patient has completed right breast reconstruction on 07/20/2018 and s/p adjuvant radiation, finished 10/10/2018.   #Chemotherapy-induced neuropathy, patient is on Lyrica 300 mg daily.  Feels that neuropathy is slightly better controlled. She was  taking Cymbalta which did not improve her symptoms.  Also reports that the Cymbalta makes patient feel nauseous. So she stopped the Cymbalta and ask for other medications to help with her depression.  She used to be on Lexapro which works fine for her.  #Takes letrozole 2.5 mg daily.  Reports ongoing manageable hot flash. Chronic achiness in the body pain slightly better. She follows up with cardiology for periodic palpitation. Bilateral upper extremity swelling, slightly better.  She follows up with lymphedema clinic. #Reports right breast reconstruction site warmth, uncomfortable.  She does not like the reconstruction and wanted the implants to be taken out.  Denies any fever or chills.   Review of Systems  Constitutional: Positive for malaise/fatigue. Negative for chills, fever and weight loss.  HENT: Negative for sore throat.   Eyes: Negative for redness.  Respiratory: Positive for shortness of breath. Negative for cough and wheezing.   Cardiovascular: Positive for palpitations. Negative for chest pain and leg swelling.  Gastrointestinal: Negative for abdominal pain, blood in stool, nausea and vomiting.  Genitourinary: Negative for dysuria.  Musculoskeletal: Positive for joint pain and myalgias.  Skin: Negative for rash.  Neurological: Negative for dizziness, tingling and tremors.  Endo/Heme/Allergies: Does not bruise/bleed easily.  Psychiatric/Behavioral: Negative for hallucinations.    No Known Allergies  Patient Active Problem List   Diagnosis Date Noted  . Shortness of breath 08/23/2018  . Nonischemic cardiomyopathy ([place] 08/23/2018  . Preprocedural cardiovascular examination 08/23/2018  . Tachycardia 08/23/2018  . Palpitations 08/23/2018  . Estrogen receptor positive status (ER+) 04/04/2018  . Breast cancer of upper-outer quadrant of right female breast ([facility] 03/19/2018  . Family history of cancer   . Malignant neoplasm of overlapping sites of right breast in female,  estrogen receptor positive ([facility] 10/19/2017     Past Medical History:  Diagnosis Date  . Anemia   . BRCA negative 11/26/2017  . Breast cancer ([place]) 10/11/2017   Multifocal, ER positive, PR negative, HER-2 negative. ypT3 ypN2a 8.7 cm, 4/15 nodes  . Cardiomyopathy ([place])    a. 10/2017 Echo: EF 60-65%, no rwma, Gr1 DD, nl RV size/fxn; b. 04/2018 Echo: EF 55-60%, no rwma, Nl RV size/fxn; c. 08/2018 Echo: EF 45%, diff HK, ? HK of antsept wall. Gr1 DD. Mild MR. Mild LAE/RAE. Mod dil RV.   [name] Chronic bronchitis ([place]) 11/2017  . COPD (chronic obstructive pulmonary disease) ([place])    MILD PER CXR  . Depression   . Family history of cancer   . GERD (gastroesophageal reflux disease)   . Headache    MIGRAINES  . Heart murmur    ASYMPTOMATIC  . Personal history of chemotherapy    current for right breast ca     Past Surgical History:  Procedure Laterality Date  . AXILLARY LYMPH NODE DISSECTION Right 03/19/2018   Procedure: AXILLARY LYMPH NODE DISSECTION;  Surgeon: Robert Bellow, MD;  Location: [facility] ORS;  Service: General;  Laterality: Right;  . BREAST BIOPSY Right 10/11/2017   12:30 posterior coil clip invasive mammary carcinoma  . BREAST BIOPSY Right 10/11/2017   11:30 middle depth ribbon clip DCIS  . BREAST BIOPSY Right 10/11/2017   5:30 anterior depth x shape invasive ductal carcinoma  . BREAST RECONSTRUCTION WITH PLACEMENT OF TISSUE EXPANDER AND FLEX HD (ACELLULAR HYDRATED DERMIS) Right 03/19/2018   Procedure: BREAST RECONSTRUCTION WITH PLACEMENT OF TISSUE EXPANDER AND FLEX HD (ACELLULAR HYDRATED DERMIS);  Surgeon: Wallace Going, DO;  Location: [facility] OR[facility];  Service: Plastics;  Laterality: Right;  . LAPAROSCOPIC BILATERAL SALPINGO OOPHERECTOMY Bilateral 03/19/2018   Procedure: LAPAROSCOPIC BILATERAL SALPINGO OOPHORECTOMY;  Surgeon: Benjaman Kindler, MD;  Location: [facility];  Service: Gynecology;  Laterality: Bilateral;  . MASTECTOMY Right 03/2018  . MASTECTOMY W/ SENTINEL NODE BIOPSY  Right 03/19/2018   Procedure: MASTECTOMY WITH SENTINEL LYMPH NODE BIOPSY;  Surgeon: Robert Bellow, MD;  Location: [facility];  Service: General;  Laterality: Right;  . PORTACATH PLACEMENT Left 10/24/2017   Procedure: INSERTION PORT-A-CATH;  Surgeon: Robert Bellow, MD;  Location: [facility] OR[facility];  Service: General;  Laterality: Left;  . REMOVAL OF TISSUE EXPANDER AND PLACEMENT OF IMPLANT Right 07/20/2018   Procedure: REMOVAL OF RIGHT BREAST TISSUE EXPANDER AND PLACEMENT OF IMPLANT;  Surgeon: Wallace Going, DO;  Location: [place];  Service: Plastics;  Laterality: Right;    Social History   Socioeconomic History  . Marital status: Married    Spouse name: Not on file  . Number of children: Not on file  . Years of education: Not on file  . Highest education level: Not on file  Occupational History  . Occupation: Occupational psychologist    Comment: [employer]  . Financial resource strain: Not on file  . Food insecurity:    Worry: Not on file    Inability: Not on file  . Transportation needs:    Medical: Not on file    Non-medical: Not on file  Tobacco Use  . Smoking status: Former Smoker    Packs/day: 0.50    Years: 18.00    Pack years: 9.00    Types: Cigarettes    Start date: 06/21/2018  . Smokeless tobacco: Never Used  Substance and Sexual Activity  . Alcohol use: No    Frequency: Never  . Drug use: No  .  Sexual activity: Yes    Birth control/protection: Injection  Lifestyle  . Physical activity:    Days per week: Not on file    Minutes per session: Not on file  . Stress: Not on file  Relationships  . Social connections:    Talks on phone: Not on file    Gets together: Not on file    Attends religious service: Not on file    Active member of club or organization: Not on file    Attends meetings of clubs or organizations: Not on file    Relationship status: Not on file  . Intimate partner violence:    Fear of  current or ex partner: Not on file    Emotionally abused: Not on file    Physically abused: Not on file    Forced sexual activity: Not on file  Other Topics Concern  . Not on file  Social History Narrative  . Not on file     Family History  Problem Relation Age of Onset  . Melanoma Maternal Aunt        other aunts with BCC/SCC/Melanoma  . Diabetes Father   . Hypertension Father   . Hyperlipidemia Father   . Heart attack Father 67       "mild"  . Bladder Cancer Maternal Grandmother   . Cervical cancer Maternal Aunt 64       daughter w/ cervical cancer as well  . Melanoma Maternal Uncle        other uncles with BCC/SCC/Melanoma     Current Outpatient Medications:  .  albuterol (PROVENTIL HFA;VENTOLIN HFA) 108 (90 Base) MCG/ACT inhaler, Inhale 2 puffs into the lungs every 6 (six) hours as needed for wheezing or shortness of breath (Cough)., Disp: 1 Inhaler, Rfl: 2 .  ALPRAZolam (XANAX) 0.25 MG tablet, Take 0.25 mg at bedtime as needed by mouth for anxiety or sleep. , Disp: , Rfl:  .  Calcium Carb-Cholecalciferol (CALCIUM 600 + D PO), Take 1 tablet daily by mouth., Disp: , Rfl:  .  chlorhexidine (PERIDEX) 0.12 % solution, USE AS DIRECTED 15MLS IN THE MOUTH OR THROAT TWICE A DAY, Disp: 473 mL, Rfl: 1 .  cyclobenzaprine (FLEXERIL) 5 MG tablet, Take 1 tablet (5 mg total) by mouth 3 (three) times daily as needed for muscle spasms., Disp: 90 tablet, Rfl: 0 .  doxylamine, Sleep, (UNISOM) 25 MG tablet, Take 25 mg by mouth at bedtime as needed for sleep., Disp: , Rfl:  .  esomeprazole (NEXIUM) 40 MG capsule, Take 40 mg by mouth every morning. , Disp: , Rfl:  .  HYDROcodone-acetaminophen (NORCO) 5-325 MG tablet, Take 1 tablet by mouth every 6 (six) hours as needed for moderate pain., Disp: 30 tablet, Rfl: 0 .  ibuprofen (ADVIL,MOTRIN) 800 MG tablet, Take 1 tablet (800 mg total) by mouth every 8 (eight) hours as needed for moderate pain., Disp: 30 tablet, Rfl: 1 .  letrozole (FEMARA) 2.5 MG  tablet, Take 1 tablet (2.5 mg total) by mouth daily., Disp: 90 tablet, Rfl: 0 .  loratadine (CLARITIN) 10 MG tablet, Take 10 mg by mouth daily. , Disp: , Rfl:  .  LORazepam (ATIVAN) 1 MG tablet, Take 1 mg by mouth every 8 (eight) hours., Disp: , Rfl:  .  metoprolol succinate (TOPROL XL) 25 MG 24 hr tablet, Take 1.5 tablets (37.5 mg total) by mouth daily., Disp: 135 tablet, Rfl: 3 .  pregabalin (LYRICA) 300 MG capsule, Take 1 capsule (300 mg total) by mouth daily. (  Patient taking differently: Take 150 mg by mouth 2 (two) times daily. ), Disp: 30 capsule, Rfl: 3 .  prochlorperazine (COMPAZINE) 10 MG tablet, Take 1 tablet (10 mg total) by mouth every 6 (six) hours as needed (Nausea or vomiting)., Disp: 30 tablet, Rfl: 1 .  promethazine (PHENERGAN) 25 MG tablet, Take 1 tablet (25 mg total) by mouth every 6 (six) hours as needed for nausea or vomiting., Disp: 30 tablet, Rfl: 3 .  pyridOXINE (VITAMIN B-6) 100 MG tablet, Take 1 tablet (100 mg total) by mouth daily., Disp: 30 tablet, Rfl: 1 .  vitamin C (ASCORBIC ACID) 500 MG tablet, Take 500 mg by mouth daily., Disp: , Rfl:  .  ergocalciferol (VITAMIN D2) 1.25 MG (50000 UT) capsule, Take 1 capsule (50,000 Units total) by mouth once a week., Disp: 6 capsule, Rfl: 0 .  escitalopram (LEXAPRO) 10 MG tablet, Take 1 tablet (10 mg total) by mouth daily., Disp: 30 tablet, Rfl: 2 No current facility-administered medications for this visit.   Facility-Administered Medications Ordered in Other Visits:  .  heparin lock flush 100 unit/mL, 500 Units, Intravenous, Once, Earlie Server, MD .  sodium chloride flush (NS) 0.9 % injection 10 mL, 10 mL, Intravenous, PRN, Earlie Server, MD, 10 mL at 11/19/18 1249   Physical exam:  Vitals:   02/14/19 0839  BP: 113/74  Pulse: 96  Resp: 18  Temp: (!) 96.1 F (35.6 C)  TempSrc: Tympanic  Weight: 195 lb 1.6 oz (88.5 kg)  ECOG 1 Physical Exam  Constitutional: She is oriented to person, place, and time. No distress.  HENT:  Head:  Normocephalic and atraumatic.  Mouth/Throat: No oropharyngeal exudate.  Eyes: Pupils are equal, round, and reactive to light. Conjunctivae and EOM are normal. Left eye exhibits no discharge. No scleral icterus.  Neck: Normal range of motion. Neck supple. No JVD present.  Cardiovascular: Normal rate, regular rhythm and normal heart sounds.  No murmur heard. Pulmonary/Chest: Effort normal and breath sounds normal. No respiratory distress. She has no wheezes. She has no rales. She exhibits no tenderness.  Abdominal: Soft. Bowel sounds are normal. She exhibits no distension and no mass. There is no abdominal tenderness. There is no rebound.  Musculoskeletal: Normal range of motion.        General: No edema.     Comments: Bilateral hand swelling.   Lymphadenopathy:    She has no cervical adenopathy.  Neurological: She is alert and oriented to person, place, and time. No cranial nerve deficit. She exhibits normal muscle tone. Coordination normal.  Skin: Skin is warm and dry. No rash noted. She is not diaphoretic. No erythema.  Psychiatric: Affect normal.  Breast exam is performed in seated position. Patient is status post right mastectomy with reconstruction. The implant edges are intact and there is no evidence of any chest wall recurrence. No erythema or fluctuance. No evidence of bilateral axillary adenopathy.       CMP Latest Ref Rng & Units 01/22/2019  Glucose 65 - 99 mg/dL 116(H)  BUN 6 - 20 mg/dL 17  Creatinine 0.57 - 1.00 mg/dL 0.94  Sodium 134 - 144 mmol/L 140  Potassium 3.5 - 5.2 mmol/L 4.1  Chloride 96 - 106 mmol/L 102  CO2 20 - 29 mmol/L 19(L)  Calcium 8.7 - 10.2 mg/dL 9.9  Total Protein 6.0 - 8.5 g/dL 7.4  Total Bilirubin 0.0 - 1.2 mg/dL <0.2  Alkaline Phos 39 - 117 IU/L 106  AST 0 - 40 IU/L 19  ALT 0 -  32 IU/L 20   CBC Latest Ref Rng & Units 01/22/2019  WBC 3.4 - 10.8 x10E3/uL 6.1  Hemoglobin 11.1 - 15.9 g/dL 12.9  Hematocrit 34.0 - 46.6 % 37.7  Platelets 150 - 450  x10E3/uL 334   RADIOGRAPHIC STUDIES: I have personally reviewed the radiological images as listed and agreed with the findings in the report. 08/23/2018 DEXA The BMD measured at Femur Neck Right is 0.900 g/cm2 with a T-score of -1.0. This patient is considered normal according to [facility]) criteria. Site Region Measured Measured WHO Young Adult BMD Date       Age      Classification T-score AP Spine L1-L4 08/23/2018 35.5 Normal -0.8 1.091 g/cm2 DualFemur Neck Right 08/23/2018 35.5 Normal -1.0 0.900 g/cm2  08/15/2018 2D echo Study Conclusions - Left ventricle: The cavity size was mildly dilated. Systolic   function was mildly reduced. The estimated ejection fraction was   45%. Diffuse hypokinesis. Hypokinesis of the anteroseptal   myocardium. Doppler parameters are consistent with abnormal left   ventricular relaxation (grade 1 diastolic dysfunction). - Aortic valve: Valve area (VTI): 1.88 cm^2. Valve area (Vmax): 1.8   cm^2. Valve area (Vmean): 1.74 cm^2. - Mitral valve: There was mild regurgitation. Valve area by   continuity equation (using LVOT flow): 2.22 cm^2. - Left atrium: The atrium was mildly dilated. - Right ventricle: The cavity size was moderately dilated. - Right atrium: The atrium was mildly dilated.  RADIOGRAPHIC STUDIES: I have personally reviewed the radiological images as listed and agreed with the findings in the report. 10/30/2018 Bone scan whole body Non scintigraphic evidence of osseous metastatic disease.  Assessment and plan- Patient is a 38 y.o. female presents for evaluation of newly diagnosed multicentric right breast cancer.  Cancer Staging Breast cancer of upper-outer quadrant of right female breast [facility]) Staging form: Breast, AJCC 8th Edition - Clinical: G2, ER+, PR+, HER2- - Signed by Earlie Server, MD on 04/05/2018 - Pathologic stage from 04/04/2018: No Stage Recommended (ypT3, pN2, cM0, G3, ER+, PR-, HER2-) - Signed by Earlie Server, MD  on 04/04/2018    1. Malignant neoplasm of overlapping sites of right breast in female, estrogen receptor positive ([place])   2. Neuropathy   3. Port-A-Cath in place   4. Aromatase inhibitor use   5. Vasomotor symptoms due to menopause    #Breast cancer, status post bilateral oophorectomy, continue letrozole.   Her breast implant examination is benign.  Subjectively she does not feel comfortable about having the implant. I discussed with Dr. Fleet Contras and his office will contact patient to being seen by him for further evaluation.  #Adjuvant bisphosphonate:  Zometa on 12/07/2018.  Due in June 2020. #Depression, she discontinued Cymbalta due to side effects of GI toxicities. Discussed with patient that I will start her back on her previous antidepressant which is Lexapro.  She tells me that she was on 10 mg daily.  Prescription sent to pharmacy.  #Chemotherapy induced neuropathy, on Lyrica 300 mg.  Reports neuropathy is slightly better.  She has tried acupuncture which did not improve her symptoms. I have previously referred her to neurology for further evaluation and management.  She has an appointment next week. She has been referred to breast cancer survivorship.  #Due for Unilateral mammogram in January 2020. #Port flush in 6 weeks. Keep her original appointment.   We spent sufficient time to discuss many aspect of care, questions were answered to patient's satisfaction.  Earlie Server, MD, PhD Hematology Oncology [facility]  [place] [facility] [place]- [phone] 02/14/19

## 2019-02-14 NOTE — Progress Notes (Signed)
Patient ID: Theresa Chaney, female   DOB: Dec 13, 1980, 38 y.o.   MRN: [id number]  Chief Complaint  Patient presents with  . Follow-up    Post reconstruction-right    HPI Theresa Chaney is a 38 y.o. female.  The patient was seen today as an urgent work in at the request of Dr. Tasia Catchings from medical oncology.  The patient reports that her right breast feels hard and has warmth, for 2 weeks. No drainage.  No trauma to the area noted.  While she has had difficulty with discomfort in the area to some degree another since the expander was replaced for the implant, these last 2 weeks have been more uncomfortable.  She stated that she wants the implant removed.  She is having bone pain and wearing a wrist brace, she sees Dr Melrose Nakayama next week.  HPI  Past Medical History:  Diagnosis Date  . Anemia   . BRCA negative 11/26/2017  . Breast cancer ([place]) 10/11/2017   Multifocal, ER positive, PR negative, HER-2 negative. ypT3 ypN2a 8.7 cm, 4/15 nodes  . Cardiomyopathy ([place])    a. 10/2017 Echo: EF 60-65%, no rwma, Gr1 DD, nl RV size/fxn; b. 04/2018 Echo: EF 55-60%, no rwma, Nl RV size/fxn; c. 08/2018 Echo: EF 45%, diff HK, ? HK of antsept wall. Gr1 DD. Mild MR. Mild LAE/RAE. Mod dil RV.   [name] Chronic bronchitis ([place]) 11/2017  . COPD (chronic obstructive pulmonary disease) ([place])    MILD PER CXR  . Depression   . Family history of cancer   . GERD (gastroesophageal reflux disease)   . Headache    MIGRAINES  . Heart murmur    ASYMPTOMATIC  . Personal history of chemotherapy    current for right breast ca    Past Surgical History:  Procedure Laterality Date  . AXILLARY LYMPH NODE DISSECTION Right 03/19/2018   Procedure: AXILLARY LYMPH NODE DISSECTION;  Surgeon: Robert Bellow, MD;  Location: [facility];  Service: General;  Laterality: Right;  . BREAST BIOPSY Right 10/11/2017   12:30 posterior coil clip invasive mammary carcinoma  . BREAST BIOPSY Right 10/11/2017   11:30 middle depth ribbon clip DCIS  . BREAST  BIOPSY Right 10/11/2017   5:30 anterior depth x shape invasive ductal carcinoma  . BREAST RECONSTRUCTION WITH PLACEMENT OF TISSUE EXPANDER AND FLEX HD (ACELLULAR HYDRATED DERMIS) Right 03/19/2018   Procedure: BREAST RECONSTRUCTION WITH PLACEMENT OF TISSUE EXPANDER AND FLEX HD (ACELLULAR HYDRATED DERMIS);  Surgeon: Wallace Going, DO;  Location: [facility];  Service: Plastics;  Laterality: Right;  . LAPAROSCOPIC BILATERAL SALPINGO OOPHERECTOMY Bilateral 03/19/2018   Procedure: LAPAROSCOPIC BILATERAL SALPINGO OOPHORECTOMY;  Surgeon: Benjaman Kindler, MD;  Location: [facility];  Service: Gynecology;  Laterality: Bilateral;  . MASTECTOMY Right 03/2018  . MASTECTOMY W/ SENTINEL NODE BIOPSY Right 03/19/2018   Procedure: MASTECTOMY WITH SENTINEL LYMPH NODE BIOPSY;  Surgeon: Robert Bellow, MD;  Location: [facility];  Service: General;  Laterality: Right;  . PORTACATH PLACEMENT Left 10/24/2017   Procedure: INSERTION PORT-A-CATH;  Surgeon: Robert Bellow, MD;  Location: [facility];  Service: General;  Laterality: Left;  . REMOVAL OF TISSUE EXPANDER AND PLACEMENT OF IMPLANT Right 07/20/2018   Procedure: REMOVAL OF RIGHT BREAST TISSUE EXPANDER AND PLACEMENT OF IMPLANT;  Surgeon: Wallace Going, DO;  Location: [facility];  Service: Plastics;  Laterality: Right;    Family History  Problem Relation Age of Onset  . Melanoma Maternal Aunt        other aunts  with BCC/SCC/Melanoma  . Diabetes Father   . Hypertension Father   . Hyperlipidemia Father   . Heart attack Father 72       "mild"  . Bladder Cancer Maternal Grandmother   . Cervical cancer Maternal Aunt 64       daughter w/ cervical cancer as well  . Melanoma Maternal Uncle        other uncles with BCC/SCC/Melanoma    Social History Social History   Tobacco Use  . Smoking status: Former Smoker    Packs/day: 0.50    Years: 18.00    Pack years: 9.00    Types: Cigarettes    Start date: 06/21/2018  . Smokeless tobacco:  Never Used  Substance Use Topics  . Alcohol use: No    Frequency: Never  . Drug use: No    No Known Allergies  Current Outpatient Medications  Medication Sig Dispense Refill  . albuterol (PROVENTIL HFA;VENTOLIN HFA) 108 (90 Base) MCG/ACT inhaler Inhale 2 puffs into the lungs every 6 (six) hours as needed for wheezing or shortness of breath (Cough). 1 Inhaler 2  . ALPRAZolam (XANAX) 0.25 MG tablet Take 0.25 mg at bedtime as needed by mouth for anxiety or sleep.     . Calcium Carb-Cholecalciferol (CALCIUM 600 + D PO) Take 1 tablet daily by mouth.    . chlorhexidine (PERIDEX) 0.12 % solution USE AS DIRECTED 15MLS IN THE MOUTH OR THROAT TWICE A DAY 473 mL 1  . cyclobenzaprine (FLEXERIL) 5 MG tablet Take 1 tablet (5 mg total) by mouth 3 (three) times daily as needed for muscle spasms. 90 tablet 0  . doxylamine, Sleep, (UNISOM) 25 MG tablet Take 25 mg by mouth at bedtime as needed for sleep.    . ergocalciferol (VITAMIN D2) 1.25 MG (50000 UT) capsule Take 1 capsule (50,000 Units total) by mouth once a week. 6 capsule 0  . escitalopram (LEXAPRO) 10 MG tablet Take 1 tablet (10 mg total) by mouth daily. 30 tablet 2  . esomeprazole (NEXIUM) 40 MG capsule Take 40 mg by mouth every morning.     [name] HYDROcodone-acetaminophen (NORCO) 5-325 MG tablet Take 1 tablet by mouth every 6 (six) hours as needed for moderate pain. 30 tablet 0  . ibuprofen (ADVIL,MOTRIN) 800 MG tablet Take 1 tablet (800 mg total) by mouth every 8 (eight) hours as needed for moderate pain. 30 tablet 1  . letrozole (FEMARA) 2.5 MG tablet Take 1 tablet (2.5 mg total) by mouth daily. 90 tablet 0  . loratadine (CLARITIN) 10 MG tablet Take 10 mg by mouth daily.     [name] LORazepam (ATIVAN) 1 MG tablet Take 1 mg by mouth every 8 (eight) hours.    . metoprolol succinate (TOPROL XL) 25 MG 24 hr tablet Take 1.5 tablets (37.5 mg total) by mouth daily. 135 tablet 3  . pregabalin (LYRICA) 300 MG capsule Take 1 capsule (300 mg total) by mouth daily.  (Patient taking differently: Take 150 mg by mouth 2 (two) times daily. ) 30 capsule 3  . prochlorperazine (COMPAZINE) 10 MG tablet Take 1 tablet (10 mg total) by mouth every 6 (six) hours as needed (Nausea or vomiting). 30 tablet 1  . promethazine (PHENERGAN) 25 MG tablet Take 1 tablet (25 mg total) by mouth every 6 (six) hours as needed for nausea or vomiting. 30 tablet 3  . pyridOXINE (VITAMIN B-6) 100 MG tablet Take 1 tablet (100 mg total) by mouth daily. 30 tablet 1  . vitamin C (ASCORBIC  ACID) 500 MG tablet Take 500 mg by mouth daily.     No current facility-administered medications for this visit.    Facility-Administered Medications Ordered in Other Visits  Medication Dose Route Frequency Provider Last Rate Last Dose  . heparin lock flush 100 unit/mL  500 Units Intravenous Once Earlie Server, MD      . sodium chloride flush (NS) 0.9 % injection 10 mL  10 mL Intravenous PRN Earlie Server, MD   10 mL at 11/19/18 1249    Review of Systems Review of Systems  Constitutional: Negative.   Respiratory: Negative.   Cardiovascular: Negative.     Blood pressure 127/81, pulse 96, temperature 98.6 F (37 C), temperature source Temporal, resp. rate 16, height 5' 7"  (1.702 m), weight 193 lb 9.6 oz (87.8 kg), SpO2 98 %.  Physical Exam Physical Exam Exam conducted with a chaperone present.  Constitutional:      Appearance: She is well-developed.  Eyes:     General: No scleral icterus.    Conjunctiva/sclera: Conjunctivae normal.  Neck:     Musculoskeletal: Normal range of motion and neck supple.  Cardiovascular:     Rate and Rhythm: Normal rate and regular rhythm.     Heart sounds: Normal heart sounds.  Pulmonary:     Effort: Pulmonary effort is normal.     Breath sounds: Normal breath sounds.  Chest:     Breasts:        Left: No inverted nipple, mass, nipple discharge, skin change or tenderness.    Lymphadenopathy:     Cervical: No cervical adenopathy.     Upper Body:     Right upper  body: No supraclavicular or axillary adenopathy.     Left upper body: No supraclavicular or axillary adenopathy.  Skin:    General: Skin is warm and dry.  Neurological:     Mental Status: She is alert and oriented to person, place, and time.  Psychiatric:        Behavior: Behavior normal.     Data Reviewed Operative report from August 09, 2018 at which time the permanent implant was placed.  Assessment Persistent breast discomfort post reconstruction.  Plan  Options for management including reassessment by plastics, potential change in implant and other modalities were reviewed.  The patient states she wants the implant out.  She desired to have this completed in [place] if possible.  I would like the patient to at least follow-up with Dr. Elisabeth Cara before any action is taken regarding the implant, and she was amenable in this regard.  Hopefully, this can be completed at the end of the month and Dr. Tedra Coupe him will be present for other procedures.     HPI, assessment, plan and physical exam has been scribed under the direction and in the presence of Robert Bellow, MD. Karie Fetch, RN  HPI, Physical Exam, Assessment and Plan have been scribed under the direction and in the presence of Robert Bellow, MD. Jonnie Finner, CMA  I have completed the exam and reviewed the above documentation for accuracy and completeness.  I agree with the above.  Haematologist has been used and any errors in dictation or transcription are unintentional.  Hervey Ard, M.D., F.A.C.S.  Forest Gleason Ebelin Dillehay 02/15/2019, 6:55 AM  Patient has been scheduled for an appointment with Dr. Audelia Hives for 02-26-19 at 9:15 am. The patient is aware of date and time.   She will follow up in the office with Dr. Bary Castilla as scheduled  for 02-28-19 at 9:15 am.  Dominga Ferry, CMA

## 2019-02-15 DIAGNOSIS — N644 Mastodynia: Secondary | ICD-10-CM | POA: Insufficient documentation

## 2019-02-19 ENCOUNTER — Telehealth: Payer: Self-pay | Admitting: *Deleted

## 2019-02-19 ENCOUNTER — Ambulatory Visit (INDEPENDENT_AMBULATORY_CARE_PROVIDER_SITE_OTHER): Payer: Managed Care, Other (non HMO)

## 2019-02-19 DIAGNOSIS — R0602 Shortness of breath: Secondary | ICD-10-CM

## 2019-02-19 NOTE — Progress Notes (Signed)
Cardiology Office Note Date:  02/20/2019  Patient ID:  Theresa Chaney, Theresa Chaney [birth year], MRN [id number] PCP:  Marguerita Merles, MD  Cardiologist:  Dr. Saunders Revel, MD    Chief Complaint: Follow up  History of Present Illness: Theresa Chaney is a 38 y.o. female with history of breast cancer s/p mastectomy and chemoradiation complicated by a mildly reduced LVEF, COPD, migraine disorder, and GERD who presents for evaluation of syncope.   She was previously evaluated in 10/2017 for intermittent sinus tachycardia and DOE. At that time echoes in 10/2017 and 04/2018 showed normal LV function and it was ultimately felt her symptoms were secondary to chemotherapy. In 08/2018, she experienced upper extremity swelling with repeat echo at that time showing an EF of 45% with question of anteroseptal hypokinesis and moderate right ventricular enlargement. Upon her primary cardiologist reviewing the echo, it was felt her EF was closer to 50-55%. She was placed on Toprol XL. Lexiscan Myoview in 10/2018 showed a medium defect of moderate severity present in the the basal anterior and mild anterior location, EF 55-65%. This was a very suboptimal study due to breast attenuation artifact as well as extracardiac uptake at the inferior border of the heart. There was reversible anterior wall defect suggestive of ischemia, though it was felt this was likely artifact given the defect did not extend to the apex. She was seen in the office in 11/2018 for follow up and was feeling well outside of intermittent palpitations over the prior couple of weeks with report of heart rates of 150 bpm. Episodes would typically last a few minutes. In this setting, she wore a Zio monitor that showed the predominant rhythm was sinus with an average heart rate of 82 bpm (range 48-175 bpm). Rare PACs and PVCs were noted with a single atrial run lasting 4 beats. There were no sustained arrhythmias or prolonged pauses. Patient triggered events corresponded to sinus  rhythm and artifact. In this setting, her Toprol XL was increased to 25 mg. She was last seen in the office on 01/22/2019 continuing to note palpitations on a daily basis, lasting 1-2 minutes and spontaneously resolving, and more noticeable at nighttime. Her Toprol XL was increased to 37.5 mg daily with further titration felt to possibly be limited to her BP.   She underwent echo on 02/19/2019 to evaluate for improvement in her cardiomyopathy which showed improvement in her EF to 99-77%, normal diastolic function, and no significant valvular abnormalities. During the echo, she mentioned to the echo tech that she suffered a syncopal episode on 3/9 with associated flushing. She felt fine at the time of her echo appointment. She was assessed by RN and appointment was scheduled for me today.   Patient comes in today to discuss syncopal episode that occurred on 3/9.  Patient indicates that she was sitting at the kitchen table and felt warm, consistent with prior hot flash.  She leaned over to pick up something and felt dizzy.  She did not suffer loss of consciousness with this.  She got up drink some water and placed a cold wash rag on the back of her neck for approximately 10 minutes.  Following this, she again had symptoms consistent with prior hot flash and stood up.  In this setting, she reportedly suffered loss of consciousness though did not hit the ground.  Her sister-in-law was at the house and caught her and was able to keep the patient in a standing position.  There was one episode of associated emesis.  No loss of bowel or bladder function.  No seizure-like activity.  Patient also indicates a syncopal episode while at work in 2018 in which she did not seek medical care.  This episode also was associated with symptoms of possible hot flashes/flushing.  She denies any associated chest pain, tachypalpitations, shortness of breath leading up to these episodes.  Since her episode on 3/9, she has felt back to  baseline and is asymptomatic.  Labs: 01/2019 - SCr 0.94, K+ 4.1, AST/ALT normal, TSH normal, magnesium 2.1, HGB 12.9    Past Medical History:  Diagnosis Date  . Anemia   . BRCA negative 11/26/2017  . Breast cancer ([place]) 10/11/2017   Multifocal, ER positive, PR negative, HER-2 negative. ypT3 ypN2a 8.7 cm, 4/15 nodes  . Cardiomyopathy ([place])    a. 10/2017 Echo: EF 60-65%, no rwma, Gr1 DD, nl RV size/fxn; b. 04/2018 Echo: EF 55-60%, no rwma, Nl RV size/fxn; c. 08/2018 Echo: EF 45%, diff HK, ? HK of antsept wall. Gr1 DD. Mild MR. Mild LAE/RAE. Mod dil RV.   [name] Chronic bronchitis ([place]) 11/2017  . COPD (chronic obstructive pulmonary disease) ([place])    MILD PER CXR  . Depression   . Family history of cancer   . GERD (gastroesophageal reflux disease)   . Headache    MIGRAINES  . Heart murmur    ASYMPTOMATIC  . Personal history of chemotherapy    current for right breast ca    Past Surgical History:  Procedure Laterality Date  . AXILLARY LYMPH NODE DISSECTION Right 03/19/2018   Procedure: AXILLARY LYMPH NODE DISSECTION;  Surgeon: Robert Bellow, MD;  Location: [facility];  Service: General;  Laterality: Right;  . BREAST BIOPSY Right 10/11/2017   12:30 posterior coil clip invasive mammary carcinoma  . BREAST BIOPSY Right 10/11/2017   11:30 middle depth ribbon clip DCIS  . BREAST BIOPSY Right 10/11/2017   5:30 anterior depth x shape invasive ductal carcinoma  . BREAST RECONSTRUCTION WITH PLACEMENT OF TISSUE EXPANDER AND FLEX HD (ACELLULAR HYDRATED DERMIS) Right 03/19/2018   Procedure: BREAST RECONSTRUCTION WITH PLACEMENT OF TISSUE EXPANDER AND FLEX HD (ACELLULAR HYDRATED DERMIS);  Surgeon: Wallace Going, DO;  Location: [facility];  Service: Plastics;  Laterality: Right;  . LAPAROSCOPIC BILATERAL SALPINGO OOPHERECTOMY Bilateral 03/19/2018   Procedure: LAPAROSCOPIC BILATERAL SALPINGO OOPHORECTOMY;  Surgeon: Benjaman Kindler, MD;  Location: [facility];  Service: Gynecology;  Laterality:  Bilateral;  . MASTECTOMY Right 03/2018  . MASTECTOMY W/ SENTINEL NODE BIOPSY Right 03/19/2018   Procedure: MASTECTOMY WITH SENTINEL LYMPH NODE BIOPSY;  Surgeon: Robert Bellow, MD;  Location: [facility];  Service: General;  Laterality: Right;  . PORTACATH PLACEMENT Left 10/24/2017   Procedure: INSERTION PORT-A-CATH;  Surgeon: Robert Bellow, MD;  Location: [facility];  Service: General;  Laterality: Left;  . REMOVAL OF TISSUE EXPANDER AND PLACEMENT OF IMPLANT Right 07/20/2018   Procedure: REMOVAL OF RIGHT BREAST TISSUE EXPANDER AND PLACEMENT OF IMPLANT;  Surgeon: Wallace Going, DO;  Location: [facility];  Service: Plastics;  Laterality: Right;    Current Meds  Medication Sig  . albuterol (PROVENTIL HFA;VENTOLIN HFA) 108 (90 Base) MCG/ACT inhaler Inhale 2 puffs into the lungs every 6 (six) hours as needed for wheezing or shortness of breath (Cough).  . ALPRAZolam (XANAX) 0.25 MG tablet Take 0.25 mg at bedtime as needed by mouth for anxiety or sleep.   . Calcium Carb-Cholecalciferol (CALCIUM 600 + D PO) Take 1 tablet daily by mouth.  . chlorhexidine (  PERIDEX) 0.12 % solution USE AS DIRECTED 15MLS IN THE MOUTH OR THROAT TWICE A DAY  . cyclobenzaprine (FLEXERIL) 5 MG tablet Take 1 tablet (5 mg total) by mouth 3 (three) times daily as needed for muscle spasms.  [name] doxylamine, Sleep, (UNISOM) 25 MG tablet Take 25 mg by mouth at bedtime as needed for sleep.  [name] escitalopram (LEXAPRO) 10 MG tablet Take 1 tablet (10 mg total) by mouth daily.  [name] esomeprazole (NEXIUM) 40 MG capsule Take 40 mg by mouth every morning.   [name] HYDROcodone-acetaminophen (NORCO) 5-325 MG tablet Take 1 tablet by mouth every 6 (six) hours as needed for moderate pain.  [name] ibuprofen (ADVIL,MOTRIN) 800 MG tablet Take 1 tablet (800 mg total) by mouth every 8 (eight) hours as needed for moderate pain.  [name] letrozole (FEMARA) 2.5 MG tablet Take 1 tablet (2.5 mg total) by mouth daily.  [name] loratadine (CLARITIN) 10 MG tablet  Take 10 mg by mouth daily.   [name] LORazepam (ATIVAN) 1 MG tablet Take 1 mg by mouth every 8 (eight) hours.  . metoprolol succinate (TOPROL XL) 25 MG 24 hr tablet Take 1.5 tablets (37.5 mg total) by mouth daily.  . nortriptyline (PAMELOR) 10 MG capsule Take 10 mg by mouth at bedtime. Take 10 MG daily for 7 days then increase to 20 MG. Will increase again if needed  . pregabalin (LYRICA) 300 MG capsule Take 1 capsule (300 mg total) by mouth daily. (Patient taking differently: Take 150 mg by mouth 2 (two) times daily. )  . prochlorperazine (COMPAZINE) 10 MG tablet Take 1 tablet (10 mg total) by mouth every 6 (six) hours as needed (Nausea or vomiting).  . promethazine (PHENERGAN) 25 MG tablet Take 1 tablet (25 mg total) by mouth every 6 (six) hours as needed for nausea or vomiting.  . pyridOXINE (VITAMIN B-6) 100 MG tablet Take 1 tablet (100 mg total) by mouth daily.  . vitamin C (ASCORBIC ACID) 500 MG tablet Take 500 mg by mouth daily.    Allergies:   Patient has no known allergies.   Social History:  The patient  reports that she has quit smoking. Her smoking use included cigarettes. She started smoking about 8 months ago. She has a 9.00 pack-year smoking history. She has never used smokeless tobacco. She reports that she does not drink alcohol or use drugs.   Family History:  The patient's family history includes Bladder Cancer in her maternal grandmother; Cervical cancer (age of onset: 20) in her maternal aunt; Diabetes in her father; Heart attack (age of onset: 65) in her father; Hyperlipidemia in her father; Hypertension in her father; Melanoma in her maternal aunt and maternal uncle.  ROS:   Review of Systems  Constitutional: Positive for malaise/fatigue. Negative for chills, diaphoresis, fever and weight loss.  HENT: Negative for congestion.   Eyes: Negative for discharge and redness.  Respiratory: Negative for cough, hemoptysis, sputum production, shortness of breath and wheezing.     Cardiovascular: Negative for chest pain, palpitations, orthopnea, claudication, leg swelling and PND.  Gastrointestinal: Negative for abdominal pain, blood in stool, heartburn, melena, nausea and vomiting.  Genitourinary: Negative for hematuria.  Musculoskeletal: Negative for falls and myalgias.  Skin: Negative for rash.  Neurological: Positive for dizziness, loss of consciousness and weakness. Negative for tingling, tremors, sensory change, speech change and focal weakness.  Endo/Heme/Allergies: Does not bruise/bleed easily.       Hot flashes  Psychiatric/Behavioral: Negative for substance abuse. The patient is not nervous/anxious.  All other systems reviewed and are negative.    PHYSICAL EXAM:  VS:  BP 108/64 (BP Location: Left Arm, Patient Position: Sitting, Cuff Size: Normal)   Pulse (!) 102   Ht _0  (1.702 m)   Wt 194 lb (88 kg)   LMP  (LMP Unknown) Comment: Ovaries and tubes removed.  BMI 30.38 kg/m  BMI: Body mass index is 30.38 kg/m.  Physical Exam  Constitutional: She is oriented to person, place, and time. She appears well-developed and well-nourished.  HENT:  Head: Normocephalic and atraumatic.  Eyes: Right eye exhibits no discharge. Left eye exhibits no discharge.  Neck: Normal range of motion. No JVD present.  Cardiovascular: Normal rate, regular rhythm, S1 normal, S2 normal and normal heart sounds. Exam reveals no distant heart sounds, no friction rub, no midsystolic click and no opening snap.  No murmur heard. Pulses:      Posterior tibial pulses are 2+ on the right side and 2+ on the left side.  Pulmonary/Chest: Effort normal and breath sounds normal. No respiratory distress. She has no decreased breath sounds. She has no wheezes. She has no rales. She exhibits no tenderness.  Abdominal: Soft. She exhibits no distension. There is no abdominal tenderness.  Musculoskeletal:        General: No edema.  Neurological: She is alert and oriented to person, place, and  time.  Skin: Skin is warm and dry. No cyanosis. Nails show no clubbing.  Psychiatric: She has a normal mood and affect. Her speech is normal and behavior is normal. Judgment and thought content normal.     EKG:  Was not ordered today.   Recent Labs: 01/22/2019: ALT 20; BUN 17; Creatinine, Ser 0.94; Hemoglobin 12.9; Magnesium 2.1; Platelets 334; Potassium 4.1; Sodium 140; TSH 0.701  No results found for requested labs within last 8760 hours.   CrCl cannot be calculated (Patient's most recent lab result is older than the maximum 21 days allowed.).   Wt Readings from Last 3 Encounters:  02/20/19 194 lb (88 kg)  02/14/19 193 lb 9.6 oz (87.8 kg)  02/14/19 195 lb 1.6 oz (88.5 kg)    Orthostatic vital signs: Lying: 110/71, 87 bpm Sitting: 105/71, 88 bpm Standing: 108/74, 94 bpm Standing x3 minutes: 110/75, 93 bpm   Other studies reviewed: Additional studies/records reviewed today include: summarized above  ASSESSMENT AND PLAN:  1. Presyncope: It is unclear if the patient actually suffered LOC given she never hit the ground and her sister-in-law was able to easily hold her up.  This is possibly in the setting of increased vagal tone with associated hot flash.  She is not orthostatic in the office today though indicates she historically does not drink much water.  She does have a bottle of water with her today.  Recent ischemic eval low risk and echo essentially normal as outlined above.  Prior outpatient cardiac monitoring for palpitations has been unrevealing.  We will repeat outpatient cardiac monitoring with a 14-day ZIO monitor to evaluate for significant arrhythmia.  2. Paroxysmal SVT/palpitations.  Symptoms improved.  Continue current dose of Toprol-XL.  Recent labs unrevealing.  Should she have significant recurrence of SVT, would consider referral to EP for SVT ablation.  3. Nonischemic cardiomyopathy: She appears well compensated and euvolemic on exam.  Weight is stable.  Most  recent echo from 02/18/2019 demonstrated normalization of EF.  Continue Toprol-XL.  4. Hot flashes: She attributes these to her underlying malignancy.  Disposition: F/u with Dr. Saunders Revel or an APP  in 6 to 8 weeks.  Current medicines are reviewed at length with the patient today.  The patient did not have any concerns regarding medicines.  Signed, Christell Faith, PA-C 02/20/2019 2:48 PM     [address] [place], [place] [id number] [phone]

## 2019-02-19 NOTE — Progress Notes (Unsigned)
   Patient ID: Theresa Chaney, female    DOB: 03/13/1981, 38 y.o.   MRN: [id number]  Patient was scheduled for an echocardiogram and stated that she had a syncopal episode with LOC yesterday. I completed the echo and RN Lars Pinks transferred her to an exam room in order to assess her condition.     Review of Systems    Physical Exam

## 2019-02-19 NOTE — Telephone Encounter (Signed)
Patient here for echo today. Dominica Severin, echo technician, felt patient needed to be assessed by a nurse. Spoke with patient. States yesterday she had an episode of passing out. She was sitting and suddenly felt hot, had to lean down to pick something up and got dizzy.  Dizziness subsided after sitting for about 10 minutes.  Then she got up and was dizzy again.  Her friend helped her safely to not fall and she passed out for less than 1 minute. Today, patient denies chest pain, dizziness or any other symptoms. Reports she feels fine today. This is the first time this has happened since fall 2018, prior to finding out she had cancer.  BP on her Apple watch was 121/83, HR 83.  She is scheduled to see Thurmond Butts tomorrow. Pt verbalized understanding to call 911 or go to the emergency room, if he develops any new or worsening symptoms.

## 2019-02-20 ENCOUNTER — Other Ambulatory Visit: Payer: Self-pay

## 2019-02-20 ENCOUNTER — Ambulatory Visit (INDEPENDENT_AMBULATORY_CARE_PROVIDER_SITE_OTHER): Payer: Managed Care, Other (non HMO) | Admitting: Physician Assistant

## 2019-02-20 ENCOUNTER — Encounter: Payer: Self-pay | Admitting: Physician Assistant

## 2019-02-20 ENCOUNTER — Ambulatory Visit (INDEPENDENT_AMBULATORY_CARE_PROVIDER_SITE_OTHER): Payer: Managed Care, Other (non HMO)

## 2019-02-20 VITALS — BP 108/64 | HR 102 | Ht 67.0 in | Wt 194.0 lb

## 2019-02-20 DIAGNOSIS — I471 Supraventricular tachycardia, unspecified: Secondary | ICD-10-CM

## 2019-02-20 DIAGNOSIS — R232 Flushing: Secondary | ICD-10-CM

## 2019-02-20 DIAGNOSIS — R55 Syncope and collapse: Secondary | ICD-10-CM

## 2019-02-20 DIAGNOSIS — I428 Other cardiomyopathies: Secondary | ICD-10-CM | POA: Diagnosis not present

## 2019-02-20 DIAGNOSIS — R42 Dizziness and giddiness: Secondary | ICD-10-CM | POA: Diagnosis not present

## 2019-02-20 NOTE — Patient Instructions (Signed)
Medication Instructions:  Your physician recommends that you continue on your current medications as directed. Please refer to the Current Medication list given to you today.  If you need a refill on your cardiac medications before your next appointment, please call your pharmacy.   Lab work: None ordered  If you have labs (blood work) drawn today and your tests are completely normal, you will receive your results only by: [name] MyChart Message (if you have MyChart) OR . A paper copy in the mail If you have any lab test that is abnormal or we need to change your treatment, we will call you to review the results.  Testing/Procedures: 1- Zio XT  A zio monitor was placed today. It will remain on for 14 days. You will then return monitor and event diary in provided box. It takes 1-2 weeks for report to be downloaded and returned to [place]. We will call you with the results. If monitor falls of or has orange flashing light, please call Zio for further instructions.     Follow-Up: At [facility], you and your health needs are our priority.  As part of our continuing mission to provide you with exceptional heart care, we have created designated Provider Care Teams.  These Care Teams include your primary Cardiologist (physician) and Advanced Practice Providers (APPs -  Physician Assistants and Nurse Practitioners) who all work together to provide you with the care you need, when you need it. You will need a follow up appointment in 4-6 weeks.  You may see Nelva Bush, MD or Christell Faith, PA-C

## 2019-02-21 ENCOUNTER — Ambulatory Visit: Payer: Self-pay | Admitting: General Surgery

## 2019-02-26 ENCOUNTER — Encounter: Payer: Self-pay | Admitting: Plastic Surgery

## 2019-02-26 ENCOUNTER — Other Ambulatory Visit: Payer: Self-pay

## 2019-02-26 ENCOUNTER — Ambulatory Visit (INDEPENDENT_AMBULATORY_CARE_PROVIDER_SITE_OTHER): Payer: Managed Care, Other (non HMO) | Admitting: Plastic Surgery

## 2019-02-26 DIAGNOSIS — Z9011 Acquired absence of right breast and nipple: Secondary | ICD-10-CM

## 2019-02-26 DIAGNOSIS — Z9889 Other specified postprocedural states: Secondary | ICD-10-CM

## 2019-02-26 NOTE — Progress Notes (Signed)
   Subjective:    Patient ID: Theresa Chaney, female    DOB: [birth year], 38 y.o.   MRN: [id number]  The patent is a 38 year old female here for a discussion about her right breast reconstruction.  The patient feels that the implant is tight.  It is tight in the lower medial aspect of the breast.  She states she has tried massage without improvement.  There is no redness and no sign of infection.  The incision is well-healed.  The upper portion is soft.  She originally had a mastectomy due to invasive carcinoma that was estrogen positive and progesterone negative and HER-2 negative.  She has decided that she does not want to keep the reconstruction.  She would like to have the implant removed on the right breast.   Review of Systems  Constitutional: Negative.   HENT: Negative.   Eyes: Negative.   Respiratory: Negative.  Negative for shortness of breath.   Cardiovascular: Negative.   Gastrointestinal: Negative.   Endocrine: Negative.   Genitourinary: Negative.   Musculoskeletal: Negative.  Negative for back pain.  Skin: Negative for color change and wound.  Hematological: Negative.   Psychiatric/Behavioral: Negative.        Objective:   Physical Exam Vitals signs and nursing note reviewed.  Constitutional:      Appearance: Normal appearance.  HENT:     Head: Normocephalic and atraumatic.     Nose: Nose normal.     Mouth/Throat:     Mouth: Mucous membranes are moist.  Eyes:     Extraocular Movements: Extraocular movements intact.  Neck:     Musculoskeletal: Normal range of motion.  Cardiovascular:     Rate and Rhythm: Normal rate.     Pulses: Normal pulses.  Pulmonary:     Effort: Pulmonary effort is normal.  Abdominal:     General: Abdomen is flat.  Musculoskeletal:        General: No swelling.  Neurological:     General: No focal deficit present.     Mental Status: She is alert.  Psychiatric:        Mood and Affect: Mood normal.        Behavior: Behavior normal.      Thought Content: Thought content normal.        Judgment: Judgment normal.        Assessment & Plan:  Status post right breast reconstruction  Status post right mastectomy  Plan for right breast implant removal with excision of excess skin. We had a long discussion about options.  The patient wants to proceed with removal of the implant.  We will move in that direction.  I will plan on talking with her again to be sure this is what she wants to do as we get closer to the surgery.  She would like to do this in [place].

## 2019-02-28 ENCOUNTER — Ambulatory Visit: Payer: Managed Care, Other (non HMO) | Admitting: General Surgery

## 2019-03-05 ENCOUNTER — Encounter: Payer: Self-pay | Admitting: *Deleted

## 2019-03-05 ENCOUNTER — Other Ambulatory Visit: Payer: Self-pay | Admitting: *Deleted

## 2019-03-05 DIAGNOSIS — C50919 Malignant neoplasm of unspecified site of unspecified female breast: Secondary | ICD-10-CM

## 2019-03-06 ENCOUNTER — Other Ambulatory Visit: Payer: BLUE CROSS/BLUE SHIELD

## 2019-03-06 ENCOUNTER — Other Ambulatory Visit: Payer: Self-pay

## 2019-03-06 ENCOUNTER — Ambulatory Visit: Payer: BLUE CROSS/BLUE SHIELD | Admitting: Oncology

## 2019-03-06 ENCOUNTER — Inpatient Hospital Stay: Payer: Managed Care, Other (non HMO)

## 2019-03-06 ENCOUNTER — Telehealth: Payer: Self-pay | Admitting: *Deleted

## 2019-03-06 VITALS — BP 115/78 | HR 79 | Temp 97.0°F

## 2019-03-06 DIAGNOSIS — Z170 Estrogen receptor positive status [ER+]: Secondary | ICD-10-CM

## 2019-03-06 DIAGNOSIS — C50919 Malignant neoplasm of unspecified site of unspecified female breast: Secondary | ICD-10-CM

## 2019-03-06 DIAGNOSIS — C50811 Malignant neoplasm of overlapping sites of right female breast: Secondary | ICD-10-CM | POA: Diagnosis not present

## 2019-03-06 DIAGNOSIS — T451X5A Adverse effect of antineoplastic and immunosuppressive drugs, initial encounter: Secondary | ICD-10-CM

## 2019-03-06 DIAGNOSIS — N951 Menopausal and female climacteric states: Secondary | ICD-10-CM

## 2019-03-06 DIAGNOSIS — M7989 Other specified soft tissue disorders: Secondary | ICD-10-CM

## 2019-03-06 DIAGNOSIS — G620 Drug-induced polyneuropathy: Secondary | ICD-10-CM

## 2019-03-06 DIAGNOSIS — Z79811 Long term (current) use of aromatase inhibitors: Secondary | ICD-10-CM

## 2019-03-06 DIAGNOSIS — Z95828 Presence of other vascular implants and grafts: Secondary | ICD-10-CM

## 2019-03-06 LAB — COMPREHENSIVE METABOLIC PANEL
ALBUMIN: 4.3 g/dL (ref 3.5–5.0)
ALT: 16 U/L (ref 0–44)
AST: 22 U/L (ref 15–41)
Alkaline Phosphatase: 78 U/L (ref 38–126)
Anion gap: 7 (ref 5–15)
BUN: 13 mg/dL (ref 6–20)
CO2: 25 mmol/L (ref 22–32)
Calcium: 9.5 mg/dL (ref 8.9–10.3)
Chloride: 106 mmol/L (ref 98–111)
Creatinine, Ser: 0.68 mg/dL (ref 0.44–1.00)
GFR calc Af Amer: >60 mL/min (ref >60)
GFR calc non Af Amer: >60 mL/min (ref >60)
GLUCOSE: 102 mg/dL — AB (ref 70–99)
Potassium: 3.7 mmol/L (ref 3.5–5.1)
Sodium: 138 mmol/L (ref 135–145)
Total Bilirubin: 0.4 mg/dL (ref 0.3–1.2)
Total Protein: 7.8 g/dL (ref 6.5–8.1)

## 2019-03-06 LAB — CBC WITH DIFFERENTIAL/PLATELET
ABS IMMATURE GRANULOCYTES: 0.01 10*3/uL (ref 0.00–0.07)
Basophils Absolute: 0 10*3/uL (ref 0.0–0.1)
Basophils Relative: 1 %
Eosinophils Absolute: 0.1 10*3/uL (ref 0.0–0.5)
Eosinophils Relative: 1 %
HCT: 38.4 % (ref 36.0–46.0)
Hemoglobin: 12.5 g/dL (ref 12.0–15.0)
IMMATURE GRANULOCYTES: 0 %
Lymphocytes Relative: 30 %
Lymphs Abs: 2.1 10*3/uL (ref 0.7–4.0)
MCH: 28.3 pg (ref 26.0–34.0)
MCHC: 32.6 g/dL (ref 30.0–36.0)
MCV: 87.1 fL (ref 80.0–100.0)
Monocytes Absolute: 0.5 10*3/uL (ref 0.1–1.0)
Monocytes Relative: 8 %
Neutro Abs: 4.4 10*3/uL (ref 1.7–7.7)
Neutrophils Relative %: 60 %
Platelets: 296 10*3/uL (ref 150–400)
RBC: 4.41 MIL/uL (ref 3.87–5.11)
RDW: 13.1 % (ref 11.5–15.5)
WBC: 7.2 10*3/uL (ref 4.0–10.5)
nRBC: 0 % (ref 0.0–0.2)

## 2019-03-06 MED ORDER — SODIUM CHLORIDE 0.9% FLUSH
10.0000 mL | Freq: Once | INTRAVENOUS | Status: AC
  Administered 2019-03-06: 10 mL via INTRAVENOUS
  Filled 2019-03-06: qty 10

## 2019-03-06 MED ORDER — HEPARIN SOD (PORK) LOCK FLUSH 100 UNIT/ML IV SOLN
500.0000 [IU] | Freq: Once | INTRAVENOUS | Status: AC
  Administered 2019-03-06: 500 [IU] via INTRAVENOUS

## 2019-03-06 NOTE — Telephone Encounter (Signed)
Reviewed results and recommendations with patient in detail. Instructed her to increase fluids and medications. She was appreciative for the call with no further questions at this time.

## 2019-03-06 NOTE — Telephone Encounter (Signed)
-----   Message from Rise Mu, [place] sent at 03/05/2019  4:22 PM EDT ----- Heart monitor showed a predominant rhythm of sinus with an average heart rate of 88 bpm with a range of 47-181 bpm. There were rare PACs/PVCs. Two episodes of WCT, favored to be NSVT over SVT with aberrancy occurred, lasting up to 7 beats. Patient triggered events corresponded to NSR and narrow complex tachycardia, favored to be sinus tachycardia.   Recommendation: Her BP has been on the softer side, though stable. She is not currently on any other antihypertensives that we could stop to allow for escalation of her Toprol. In this setting, she should continue current dose of Toprol XL 37.5 mg daily. I would like for her to continue to push fluids. The patient triggered events did not correspond with significant arrhythmia.

## 2019-03-07 ENCOUNTER — Ambulatory Visit: Payer: BLUE CROSS/BLUE SHIELD | Admitting: Oncology

## 2019-03-07 ENCOUNTER — Other Ambulatory Visit: Payer: BLUE CROSS/BLUE SHIELD

## 2019-03-07 LAB — VITAMIN D 25 HYDROXY (VIT D DEFICIENCY, FRACTURES): Vit D, 25-Hydroxy: 31.8 ng/mL (ref 30.0–100.0)

## 2019-03-08 ENCOUNTER — Inpatient Hospital Stay: Payer: Managed Care, Other (non HMO)

## 2019-03-08 ENCOUNTER — Other Ambulatory Visit: Payer: Self-pay

## 2019-03-08 ENCOUNTER — Inpatient Hospital Stay: Payer: Managed Care, Other (non HMO) | Admitting: Oncology

## 2019-03-08 ENCOUNTER — Inpatient Hospital Stay (HOSPITAL_BASED_OUTPATIENT_CLINIC_OR_DEPARTMENT_OTHER): Payer: Managed Care, Other (non HMO) | Admitting: Oncology

## 2019-03-08 DIAGNOSIS — R911 Solitary pulmonary nodule: Secondary | ICD-10-CM

## 2019-03-08 DIAGNOSIS — Z79811 Long term (current) use of aromatase inhibitors: Secondary | ICD-10-CM

## 2019-03-08 DIAGNOSIS — Z9011 Acquired absence of right breast and nipple: Secondary | ICD-10-CM

## 2019-03-08 DIAGNOSIS — C50811 Malignant neoplasm of overlapping sites of right female breast: Secondary | ICD-10-CM | POA: Diagnosis not present

## 2019-03-08 DIAGNOSIS — Z170 Estrogen receptor positive status [ER+]: Secondary | ICD-10-CM

## 2019-03-08 DIAGNOSIS — Z95828 Presence of other vascular implants and grafts: Secondary | ICD-10-CM

## 2019-03-08 DIAGNOSIS — Z79899 Other long term (current) drug therapy: Secondary | ICD-10-CM

## 2019-03-08 DIAGNOSIS — T451X5A Adverse effect of antineoplastic and immunosuppressive drugs, initial encounter: Principal | ICD-10-CM

## 2019-03-08 DIAGNOSIS — R918 Other nonspecific abnormal finding of lung field: Secondary | ICD-10-CM

## 2019-03-08 DIAGNOSIS — Z87891 Personal history of nicotine dependence: Secondary | ICD-10-CM

## 2019-03-08 DIAGNOSIS — N951 Menopausal and female climacteric states: Secondary | ICD-10-CM

## 2019-03-08 DIAGNOSIS — G620 Drug-induced polyneuropathy: Secondary | ICD-10-CM

## 2019-03-08 MED ORDER — ESCITALOPRAM OXALATE 20 MG PO TABS: 20.0000 mg | ORAL_TABLET | Freq: Every day | ORAL | 1 refills | Status: DC

## 2019-03-08 NOTE — Progress Notes (Signed)
Patient conducted via telephone. Patient continues to complain of pain to legs and feet, not getting worse. Medications continue to be the same.

## 2019-03-08 NOTE — Progress Notes (Signed)
Virtual Visit via Telephone Note  I connected with Theresa Chaney on 03/08/19 at  8:30 AM EDT by telephone and verified that I am speaking with the correct person using two identifiers.   I discussed the limitations, risks, security and privacy concerns of performing an evaluation and management service by telephone and the availability of in person appointments. I also discussed with the patient that there may be a patient responsible charge related to this service. The patient expressed understanding and agreed to proceed.  Location of patient: Home  Location of provider: work   History of Present Illness:  Oncology History 10/11/2017 Diagnosed with [id number]  Neoadjuvant dose dense AC and 1 cycle of Taxol.  Interim image showed no treatment response. 03/19/2018 right mastectomy and right axillary dissection, bilateral salpingo-oophorectomy. ypT3 ypN2 #Negative genetic testing #06/11/2018 Finish adjuvant weekly Taxol x11 Grade 2 neuropathy  #right immediate breast reconstruction with placement of tissue expanders.  Cancer TREATMENT Neoadjuvant ddAC +one dose of Taxol, due to lack of response, surgery was offered. Case was discussed on breast tumor board. 03/19/2018 S/p right mastectomy and right axillary dissection, immediate breast reconstruction with placement of expanders. Also had elective bilateral salpingo-oophorectomy..  # s/p 11 cycles Taxol adjuvantly. Tolerated well.  # She has obtained dental clearance for starting Zometa.  S/p Zometa on 6/3/ 2019 # s/p adjuvant radiation, finished 10/10/2018 INTERVAL HISTORY Theresa Chaney a 38y.o. female who has above history reviewed by me today presents for follow up visit for management of 3 months follow-up for history of breast cancer. # NeuropothyDuring the interval, patient has establish care with neurology for her chemotherapy-induced neuropathy. Nortriptyline has been added and she has been taking it for 3 to 4 weeks feeling  neuropathy symptoms is better controlled.  #At last visit she feels Cymbalta is not controlling her depression symptoms.  Request to be switched back to Lexapro for which she has been taking long-term previously.  She thought she was on Lexapro 10 mg.  Prescription was sent. Then she realized that she was taking Lexapro 20 mg in the past so she took 10 mg of Lexapro for a week and doubled her Lexapro dose to 20 mg daily.  Request more refills.  Doing well.  Depression is better controlled.  #She is taking letrozole 2.5 mg daily.  Reports ongoing manageable hot flash.  Chronic achiness in the body slightly better. #During the interval she has also been seen by Dr. BBary Castillat as she does not like her implant and feels uncomfortable.  Review of Systems  Constitutional: Positive for fatigue. Negative for appetite change, chills and fever.  HENT:   Negative for hearing loss and voice change.   Eyes: Negative for eye problems.  Respiratory: Negative for chest tightness and cough.   Cardiovascular: Negative for chest pain.  Gastrointestinal: Negative for abdominal distention, abdominal pain and blood in stool.  Endocrine: Negative for hot flashes.  Genitourinary: Negative for difficulty urinating and frequency.   Musculoskeletal: Negative for arthralgias.  Skin: Negative for itching and rash.  Neurological: Positive for numbness. Negative for extremity weakness.  Hematological: Negative for adenopathy.  Psychiatric/Behavioral: Negative for confusion. The patient is not nervous/anxious.     Past Medical History:  Diagnosis Date  . Anemia   . BRCA negative 11/26/2017  . [place]   Multifocal, ER positive, PR negative, HER-2 negative. ypT3 ypN2a 8.7 cm, 4/15 nodes  . Cardiomyopathy ([place]    a. 10/2017 Echo: EF 60-65%, no rwma,  Gr1 DD, nl RV size/fxn; b. 04/2018 Echo: EF 55-60%, no rwma, Nl RV size/fxn; c. 08/2018 Echo: EF 45%, diff HK, ? HK of antsept wall. Gr1 DD. Mild MR.  Mild LAE/RAE. Mod dil RV.   [name] Chronic bronchitis ([place]) 11/2017  . COPD (chronic obstructive pulmonary disease) ([place])    MILD PER CXR  . Depression   . Family history of cancer   . GERD (gastroesophageal reflux disease)   . Headache    MIGRAINES  . Heart murmur    ASYMPTOMATIC  . Personal history of chemotherapy    current for right breast ca   Past Surgical History:  Procedure Laterality Date  . AXILLARY LYMPH NODE DISSECTION Right 03/19/2018   Procedure: AXILLARY LYMPH NODE DISSECTION;  Surgeon: Robert Bellow, MD;  Location: [facility] ORS;  Service: General;  Laterality: Right;  . BREAST BIOPSY Right 10/11/2017   12:30 posterior coil clip invasive mammary carcinoma  . BREAST BIOPSY Right 10/11/2017   11:30 middle depth ribbon clip DCIS  . BREAST BIOPSY Right 10/11/2017   5:30 anterior depth x shape invasive ductal carcinoma  . BREAST RECONSTRUCTION WITH PLACEMENT OF TISSUE EXPANDER AND FLEX HD (ACELLULAR HYDRATED DERMIS) Right 03/19/2018   Procedure: BREAST RECONSTRUCTION WITH PLACEMENT OF TISSUE EXPANDER AND FLEX HD (ACELLULAR HYDRATED DERMIS);  Surgeon: Wallace Going, DO;  Location: [facility] ORS;  Service: Plastics;  Laterality: Right;  . LAPAROSCOPIC BILATERAL SALPINGO OOPHERECTOMY Bilateral 03/19/2018   Procedure: LAPAROSCOPIC BILATERAL SALPINGO OOPHORECTOMY;  Surgeon: Benjaman Kindler, MD;  Location: [facility] ORS;  Service: Gynecology;  Laterality: Bilateral;  . MASTECTOMY Right 03/2018  . MASTECTOMY W/ SENTINEL NODE BIOPSY Right 03/19/2018   Procedure: MASTECTOMY WITH SENTINEL LYMPH NODE BIOPSY;  Surgeon: Robert Bellow, MD;  Location: [facility] OR[facility];  Service: General;  Laterality: Right;  . PORTACATH PLACEMENT Left 10/24/2017   Procedure: INSERTION PORT-A-CATH;  Surgeon: Robert Bellow, MD;  Location: [facility] OR[facility];  Service: General;  Laterality: Left;  . REMOVAL OF TISSUE EXPANDER AND PLACEMENT OF IMPLANT Right 07/20/2018   Procedure: REMOVAL OF RIGHT BREAST TISSUE EXPANDER AND  PLACEMENT OF IMPLANT;  Surgeon: Wallace Going, DO;  Location: [place];  Service: Plastics;  Laterality: Right;    Family History  Problem Relation Age of Onset  . Melanoma Maternal Aunt        other aunts with BCC/SCC/Melanoma  . Diabetes Father   . Hypertension Father   . Hyperlipidemia Father   . Heart attack Father 24       "mild"  . Bladder Cancer Maternal Grandmother   . Cervical cancer Maternal Aunt 64       daughter w/ cervical cancer as well  . Melanoma Maternal Uncle        other uncles with BCC/SCC/Melanoma    Social History   Socioeconomic History  . Marital status: Married    Spouse name: Not on file  . Number of children: Not on file  . Years of education: Not on file  . Highest education level: Not on file  Occupational History  . Occupation: Occupational psychologist    Comment: [employer]  . Financial resource strain: Not on file  . Food insecurity:    Worry: Not on file    Inability: Not on file  . Transportation needs:    Medical: Not on file    Non-medical: Not on file  Tobacco Use  . Smoking status: Former Smoker    Packs/day:  0.50    Years: 18.00    Pack years: 9.00    Types: Cigarettes    Start date: 06/21/2018  . Smokeless tobacco: Never Used  Substance and Sexual Activity  . Alcohol use: No    Frequency: Never  . Drug use: No  . Sexual activity: Yes    Birth control/protection: Injection  Lifestyle  . Physical activity:    Days per week: Not on file    Minutes per session: Not on file  . Stress: Not on file  Relationships  . Social connections:    Talks on phone: Not on file    Gets together: Not on file    Attends religious service: Not on file    Active member of club or organization: Not on file    Attends meetings of clubs or organizations: Not on file    Relationship status: Not on file  . Intimate partner violence:    Fear of current or ex partner: Not on file     Emotionally abused: Not on file    Physically abused: Not on file    Forced sexual activity: Not on file  Other Topics Concern  . Not on file  Social History Narrative  . Not on file    Current Outpatient Medications on File Prior to Visit  Medication Sig Dispense Refill  . albuterol (PROVENTIL HFA;VENTOLIN HFA) 108 (90 Base) MCG/ACT inhaler Inhale 2 puffs into the lungs every 6 (six) hours as needed for wheezing or shortness of breath (Cough). 1 Inhaler 2  . ALPRAZolam (XANAX) 0.25 MG tablet Take 0.25 mg at bedtime as needed by mouth for anxiety or sleep.     . Calcium Carb-Cholecalciferol (CALCIUM 600 + D PO) Take 1 tablet daily by mouth.    . chlorhexidine (PERIDEX) 0.12 % solution USE AS DIRECTED 15MLS IN THE MOUTH OR THROAT TWICE A DAY 473 mL 1  . cyclobenzaprine (FLEXERIL) 5 MG tablet Take 1 tablet (5 mg total) by mouth 3 (three) times daily as needed for muscle spasms. 90 tablet 0  . doxylamine, Sleep, (UNISOM) 25 MG tablet Take 25 mg by mouth at bedtime as needed for sleep.    [name] escitalopram (LEXAPRO) 10 MG tablet Take 1 tablet (10 mg total) by mouth daily. 30 tablet 2  . esomeprazole (NEXIUM) 40 MG capsule Take 40 mg by mouth every morning.     [name] HYDROcodone-acetaminophen (NORCO) 5-325 MG tablet Take 1 tablet by mouth every 6 (six) hours as needed for moderate pain. 30 tablet 0  . ibuprofen (ADVIL,MOTRIN) 800 MG tablet Take 1 tablet (800 mg total) by mouth every 8 (eight) hours as needed for moderate pain. 30 tablet 1  . letrozole (FEMARA) 2.5 MG tablet Take 1 tablet (2.5 mg total) by mouth daily. 90 tablet 0  . loratadine (CLARITIN) 10 MG tablet Take 10 mg by mouth daily.     [name] LORazepam (ATIVAN) 1 MG tablet Take 1 mg by mouth every 8 (eight) hours.    . metoprolol succinate (TOPROL XL) 25 MG 24 hr tablet Take 1.5 tablets (37.5 mg total) by mouth daily. 135 tablet 3  . nortriptyline (PAMELOR) 10 MG capsule Take 10 mg by mouth at bedtime. Take 10 MG daily for 7 days then increase to  20 MG. Will increase again if needed    . pregabalin (LYRICA) 300 MG capsule Take 1 capsule (300 mg total) by mouth daily. (Patient taking differently: Take 150 mg by mouth 2 (two) times daily. )  30 capsule 3  . prochlorperazine (COMPAZINE) 10 MG tablet Take 1 tablet (10 mg total) by mouth every 6 (six) hours as needed (Nausea or vomiting). 30 tablet 1  . promethazine (PHENERGAN) 25 MG tablet Take 1 tablet (25 mg total) by mouth every 6 (six) hours as needed for nausea or vomiting. 30 tablet 3  . pyridOXINE (VITAMIN B-6) 100 MG tablet Take 1 tablet (100 mg total) by mouth daily. 30 tablet 1  . vitamin C (ASCORBIC ACID) 500 MG tablet Take 500 mg by mouth daily.     Current Facility-Administered Medications on File Prior to Visit  Medication Dose Route Frequency Provider Last Rate Last Dose  . heparin lock flush 100 unit/mL  500 Units Intravenous Once Earlie Server, MD      . sodium chloride flush (NS) 0.9 % injection 10 mL  10 mL Intravenous PRN Earlie Server, MD   10 mL at 11/19/18 1249    No Known Allergies     Observations/Objective: There were no vitals filed for this visit. There is no height or weight on file to calculate BMI.   CBC    Component Value Date/Time   WBC 7.2 03/06/2019 1029   RBC 4.41 03/06/2019 1029   HGB 12.5 03/06/2019 1029   HGB 12.9 01/22/2019 0915   HCT 38.4 03/06/2019 1029   HCT 37.7 01/22/2019 0915   PLT 296 03/06/2019 1029   PLT 334 01/22/2019 0915   MCV 87.1 03/06/2019 1029   MCV 86 01/22/2019 0915   MCH 28.3 03/06/2019 1029   MCHC 32.6 03/06/2019 1029   RDW 13.1 03/06/2019 1029   RDW 12.5 01/22/2019 0915   LYMPHSABS 2.1 03/06/2019 1029   MONOABS 0.5 03/06/2019 1029   EOSABS 0.1 03/06/2019 1029   BASOSABS 0.0 03/06/2019 1029    CMP     Component Value Date/Time   NA 138 03/06/2019 1029   NA 140 01/22/2019 0915   K 3.7 03/06/2019 1029   CL 106 03/06/2019 1029   CO2 25 03/06/2019 1029   GLUCOSE 102 (H) 03/06/2019 1029   BUN 13 03/06/2019 1029   BUN  17 01/22/2019 0915   CREATININE 0.68 03/06/2019 1029   CALCIUM 9.5 03/06/2019 1029   PROT 7.8 03/06/2019 1029   PROT 7.4 01/22/2019 0915   ALBUMIN 4.3 03/06/2019 1029   ALBUMIN 4.8 01/22/2019 0915   AST 22 03/06/2019 1029   ALT 16 03/06/2019 1029   ALKPHOS 78 03/06/2019 1029   BILITOT 0.4 03/06/2019 1029   BILITOT <0.2 01/22/2019 0915   GFRNONAA >60 03/06/2019 1029   GFRAA >60 03/06/2019 1029     Assessment and Plan: 1. Neuropathy due to chemotherapeutic drug ([place])   2. Malignant neoplasm of overlapping sites of right breast in female, estrogen receptor positive ([place])   3. Port-A-Cath in place   4. Aromatase inhibitor use   5. Vasomotor symptoms due to menopause   6. Lung nodule     #History of breast cancer, continue letrozole.  Tolerating well with some vasomotor symptoms.  Appears that she is getting used to it. #Neuropathy, during the interval she has establish care with neurologist who placed her on nortriptyline.  She feels that neuropathy has been better controlled now.  Continue Lyrica and vitamin B6. #Port-A-Cath in place.  We will schedule patient to have port flush every 6 weeks #Screening mammogram of unilateral breast was done on 10/08/2018 through Dr. Barbette Hair office.  No mammographic evidence of malignancy.  Due for screening mammogram in October  2020. #Lung nodules, 5 mm, has been stable on 09/24/2018 CT scan..  Follow Up Instructions: Port flush Q6 weeks.  Follow up for labs MD assessment in 3 months.    I discussed the assessment and treatment plan with the patient. The patient was provided an opportunity to ask questions and all were answered. The patient agreed with the plan and demonstrated an understanding of the instructions.   The patient was advised to call back or seek an in-person evaluation if the symptoms worsen or if the condition fails to improve as anticipated.  I provided 15 minutes of non-face-to-face time during this encounter.   Earlie Server,  MD

## 2019-03-26 ENCOUNTER — Telehealth: Payer: Self-pay

## 2019-03-26 NOTE — Telephone Encounter (Signed)
Dr Marla Roe wants to schedule this surgery for 06/03/19. We will contact the patient to review instructions and let her know if she needs to be seen in the office prior to surgery.

## 2019-03-26 NOTE — Telephone Encounter (Signed)
-----   Message from Robert Bellow, MD sent at 03/26/2019  9:37 AM EDT ----- Regarding: RE: mastectomy Yes. Both events after the pandemic issues have resolved.  ----- Message ----- From: Lesly Rubenstein, LPN Sent: 05/30/121   8:52 AM EDT To: Robert Bellow, MD Subject: mastectomy                                     I spoke with Frances Furbish at Dr Dillingham's office and they said that the patient will be getting her right breast implant removed and also wanted at the same time to have a Left breast mastectomy. The patient said you had spoken about this. I just wanted to know if that was something you were willing to do.

## 2019-03-28 ENCOUNTER — Telehealth: Payer: Self-pay

## 2019-03-28 NOTE — Telephone Encounter (Signed)
Patient's surgery to be scheduled for 06/03/19 at [facility] with Dr. Bary Castilla and Dr Marla Roe.   The patient is aware to Pre-Admit on 05/20/19 at 9:00 am. Patient will check in at the [facility] and then will be directed to the [place], [address] (first floor).   The patient will be seen for a Pre Op appointment in the office with Dr Bary Castilla on 05/14/19 at 9:00 am.  Patient aware to be NPO after midnight and have a driver.   She is aware to check in at the [place] entrance where she will be screened for the coronavirus and then sent to [facility].   Patient aware that she may have no visitors and driver will need to wait in the car due to COVID-19 restrictions.   The patient verbalizes understanding of the above.   The patient is aware to call the office should she have further questions.

## 2019-04-07 ENCOUNTER — Other Ambulatory Visit: Payer: Self-pay

## 2019-04-08 ENCOUNTER — Ambulatory Visit: Payer: Managed Care, Other (non HMO) | Admitting: Radiation Oncology

## 2019-04-09 DIAGNOSIS — R5382 Chronic fatigue, unspecified: Secondary | ICD-10-CM | POA: Insufficient documentation

## 2019-04-09 DIAGNOSIS — M2550 Pain in unspecified joint: Secondary | ICD-10-CM | POA: Insufficient documentation

## 2019-04-10 ENCOUNTER — Telehealth: Payer: Self-pay | Admitting: Internal Medicine

## 2019-04-10 NOTE — Telephone Encounter (Signed)
Received forms from [employer] to be completed Placed in interoffice mail and sent to [facility]

## 2019-04-12 ENCOUNTER — Inpatient Hospital Stay: Payer: Managed Care, Other (non HMO)

## 2019-04-25 ENCOUNTER — Ambulatory Visit: Payer: Self-pay | Admitting: General Surgery

## 2019-05-14 ENCOUNTER — Encounter: Payer: Self-pay | Admitting: General Surgery

## 2019-05-14 ENCOUNTER — Ambulatory Visit (INDEPENDENT_AMBULATORY_CARE_PROVIDER_SITE_OTHER): Payer: Managed Care, Other (non HMO) | Admitting: General Surgery

## 2019-05-14 ENCOUNTER — Other Ambulatory Visit: Payer: Self-pay

## 2019-05-14 VITALS — BP 121/82 | HR 94 | Temp 98.1°F | Resp 16 | Ht 67.0 in | Wt 184.4 lb

## 2019-05-14 DIAGNOSIS — C50811 Malignant neoplasm of overlapping sites of right female breast: Secondary | ICD-10-CM | POA: Diagnosis not present

## 2019-05-14 DIAGNOSIS — Z170 Estrogen receptor positive status [ER+]: Secondary | ICD-10-CM

## 2019-05-14 DIAGNOSIS — S62339A Displaced fracture of neck of unspecified metacarpal bone, initial encounter for closed fracture: Secondary | ICD-10-CM | POA: Insufficient documentation

## 2019-05-14 NOTE — Progress Notes (Signed)
Patient ID: Theresa Chaney, female   DOB: 09/08/1981, 38 y.o.   MRN: [id number]  Chief Complaint  Patient presents with  . Pre-op Exam    HPI Theresa Chaney is a 38 y.o. female. Here today in preparation for planned left prophylactic mastectomy and right implant removal.  The patient reports feeling well.    HPI  Past Medical History:  Diagnosis Date  . Anemia   . BRCA negative 11/26/2017  . Breast cancer (HCC) 10/11/2017   Multifocal, ER positive, PR negative, HER-2 negative. ypT3 ypN2a 8.7 cm, 4/15 nodes  . Cardiomyopathy (HCC)    a. 10/2017 Echo: EF 60-65%, no rwma, Gr1 DD, nl RV size/fxn; b. 04/2018 Echo: EF 55-60%, no rwma, Nl RV size/fxn; c. 08/2018 Echo: EF 45%, diff HK, ? HK of antsept wall. Gr1 DD. Mild MR. Mild LAE/RAE. Mod dil RV.   . Chronic bronchitis (HCC) 11/2017  . COPD (chronic obstructive pulmonary disease) (HCC)    MILD PER CXR  . Depression   . Family history of cancer   . GERD (gastroesophageal reflux disease)   . Headache    MIGRAINES  . Heart murmur    ASYMPTOMATIC  . Personal history of chemotherapy    current for right breast ca    Past Surgical History:  Procedure Laterality Date  . AXILLARY LYMPH NODE DISSECTION Right 03/19/2018   Procedure: AXILLARY LYMPH NODE DISSECTION;  Surgeon: Byrnett, Jeffrey W, MD;  Location: [facility];  Service: General;  Laterality: Right;  . BREAST BIOPSY Right 10/11/2017   12:30 posterior coil clip invasive mammary carcinoma  . BREAST BIOPSY Right 10/11/2017   11:30 middle depth ribbon clip DCIS  . BREAST BIOPSY Right 10/11/2017   5:30 anterior depth x shape invasive ductal carcinoma  . BREAST RECONSTRUCTION WITH PLACEMENT OF TISSUE EXPANDER AND FLEX HD (ACELLULAR HYDRATED DERMIS) Right 03/19/2018   Procedure: BREAST RECONSTRUCTION WITH PLACEMENT OF TISSUE EXPANDER AND FLEX HD (ACELLULAR HYDRATED DERMIS);  Surgeon: Dillingham, Claire S, DO;  Location: [facility];  Service: Plastics;  Laterality: Right;  . LAPAROSCOPIC  BILATERAL SALPINGO OOPHERECTOMY Bilateral 03/19/2018   Procedure: LAPAROSCOPIC BILATERAL SALPINGO OOPHORECTOMY;  Surgeon: Beasley, Bethany, MD;  Location: [facility];  Service: Gynecology;  Laterality: Bilateral;  . MASTECTOMY Right 03/2018  . MASTECTOMY W/ SENTINEL NODE BIOPSY Right 03/19/2018   Procedure: MASTECTOMY WITH SENTINEL LYMPH NODE BIOPSY;  Surgeon: Byrnett, Jeffrey W, MD;  Location: [facility];  Service: General;  Laterality: Right;  . PORTACATH PLACEMENT Left 10/24/2017   Procedure: INSERTION PORT-A-CATH;  Surgeon: Byrnett, Jeffrey W, MD;  Location: [facility];  Service: General;  Laterality: Left;  . REMOVAL OF TISSUE EXPANDER AND PLACEMENT OF IMPLANT Right 07/20/2018   Procedure: REMOVAL OF RIGHT BREAST TISSUE EXPANDER AND PLACEMENT OF IMPLANT;  Surgeon: Dillingham, Claire S, DO;  Location: [facility];  Service: Plastics;  Laterality: Right;    Family History  Problem Relation Age of Onset  . Melanoma Maternal Aunt        other aunts with BCC/SCC/Melanoma  . Diabetes Father   . Hypertension Father   . Hyperlipidemia Father   . Heart attack Father 55       "mild"  . Bladder Cancer Maternal Grandmother   . Cervical cancer Maternal Aunt 64       daughter w/ cervical cancer as well  . Melanoma Maternal Uncle        other uncles with BCC/SCC/Melanoma    Social History Social History   Tobacco Use  .   Smoking status: Former Smoker    Packs/day: 0.50    Years: 18.00    Pack years: 9.00    Types: Cigarettes    Start date: 06/21/2018  . Smokeless tobacco: Never Used  Substance Use Topics  . Alcohol use: No    Frequency: Never  . Drug use: No    No Known Allergies  Current Outpatient Medications  Medication Sig Dispense Refill  . albuterol (PROVENTIL HFA;VENTOLIN HFA) 108 (90 Base) MCG/ACT inhaler Inhale 2 puffs into the lungs every 6 (six) hours as needed for wheezing or shortness of breath (Cough). 1 Inhaler 2  . ALPRAZolam (XANAX) 0.25 MG tablet Take 0.25 mg  at bedtime as needed by mouth for anxiety or sleep.     . Calcium Carb-Cholecalciferol (CALCIUM 600 + D PO) Take 1 tablet daily by mouth.    . escitalopram (LEXAPRO) 20 MG tablet Take 1 tablet (20 mg total) by mouth daily. 90 tablet 1  . esomeprazole (NEXIUM) 40 MG capsule Take 40 mg by mouth every morning.     [name] HYDROcodone-acetaminophen (NORCO) 5-325 MG tablet Take 1 tablet by mouth every 6 (six) hours as needed for moderate pain. 30 tablet 0  . letrozole (FEMARA) 2.5 MG tablet Take 1 tablet (2.5 mg total) by mouth daily. 90 tablet 0  . loratadine (CLARITIN) 10 MG tablet Take 10 mg by mouth daily.     [name] LORazepam (ATIVAN) 1 MG tablet Take 1 mg by mouth every 8 (eight) hours.    . metoprolol succinate (TOPROL XL) 25 MG 24 hr tablet Take 1.5 tablets (37.5 mg total) by mouth daily. 135 tablet 3  . nortriptyline (PAMELOR) 10 MG capsule Take 10 mg by mouth at bedtime. Take 10 MG daily for 7 days then increase to 20 MG. Will increase again if needed    . pregabalin (LYRICA) 300 MG capsule Take 1 capsule (300 mg total) by mouth daily. (Patient taking differently: Take 150 mg by mouth 2 (two) times daily. ) 30 capsule 3  . promethazine (PHENERGAN) 25 MG tablet Take 1 tablet (25 mg total) by mouth every 6 (six) hours as needed for nausea or vomiting. 30 tablet 3  . pyridOXINE (VITAMIN B-6) 100 MG tablet Take 1 tablet (100 mg total) by mouth daily. 30 tablet 1  . vitamin C (ASCORBIC ACID) 500 MG tablet Take 500 mg by mouth daily.     No current facility-administered medications for this visit.    Facility-Administered Medications Ordered in Other Visits  Medication Dose Route Frequency Provider Last Rate Last Dose  . heparin lock flush 100 unit/mL  500 Units Intravenous Once Earlie Server, MD      . sodium chloride flush (NS) 0.9 % injection 10 mL  10 mL Intravenous PRN Earlie Server, MD   10 mL at 11/19/18 1249    Review of Systems Review of Systems  Constitutional: Negative.   Respiratory: Negative.    Cardiovascular: Negative.     Blood pressure 121/82, pulse 94, temperature 98.1 F (36.7 C), temperature source Temporal, resp. rate 16, height 5' 7" (1.702 m), weight 184 lb 6.4 oz (83.6 kg), SpO2 97 %.  Physical Exam Physical Exam Constitutional:      Appearance: She is well-developed.  Eyes:     General: No scleral icterus.    Conjunctiva/sclera: Conjunctivae normal.  Neck:     Musculoskeletal: Normal range of motion.  Cardiovascular:     Rate and Rhythm: Normal rate and regular rhythm.  Heart sounds: Normal heart sounds.  Pulmonary:     Effort: Pulmonary effort is normal.     Breath sounds: Normal breath sounds.  Chest:     Breasts:        Left: Normal. No inverted nipple, mass, nipple discharge, skin change or tenderness.    Lymphadenopathy:     Cervical: No cervical adenopathy.     Upper Body:     Right upper body: No supraclavicular or axillary adenopathy.     Left upper body: No supraclavicular or axillary adenopathy.  Skin:    General: Skin is warm and dry.  Neurological:     Mental Status: She is alert and oriented to person, place, and time.     Data Reviewed October 08, 2018 left breast screening mammogram: [id number]-1.  Assessment Previous right breast cancer, desire for implant removal and contralateral prophylactic mastectomy.  Plan Surgery is scheduled for June 03, 2019 as an outpatient.  Please call with any questions or concerns.  HPI, Physical Exam, Assessment and Plan have been scribed under the direction and in the presence of Jeffrey W. Byrnett, MD. Sheila Childers, CMA  I have completed the exam and reviewed the above documentation for accuracy and completeness.  I agree with the above.  [employer] has been used and any errors in dictation or transcription are unintentional.  Jeffrey Byrnett, M.D., F.A.C.S.  Jeffrey W Byrnett 05/15/2019, 8:42 PM   

## 2019-05-14 NOTE — Patient Instructions (Signed)
Please call with any questions or concerns.

## 2019-05-15 ENCOUNTER — Other Ambulatory Visit: Payer: Self-pay | Admitting: General Surgery

## 2019-05-15 DIAGNOSIS — C50811 Malignant neoplasm of overlapping sites of right female breast: Secondary | ICD-10-CM

## 2019-05-16 ENCOUNTER — Encounter: Payer: Self-pay | Admitting: General Surgery

## 2019-05-17 ENCOUNTER — Telehealth: Payer: Self-pay

## 2019-05-17 NOTE — Telephone Encounter (Signed)
Call to patient to review surgery instructions.  Patient's surgery is scheduled for 06/03/19 at [facility] with Dr. Bary Castilla and Dr Marla Roe.  The patient is aware she will have COVID-19 testing done when she goes to Pre Admit testing on 05/30/19 at 8:00 am. Patient will check in at the [place] entrance due to COVID-19 restrictions and will then be escorted to the [place], [address] (first floor).   Patient aware to be NPO after midnight and have a driver.   She is aware to check in at the [place] entrance where she will be screened for the coronavirus and then sent to Same Day Surgery.   Patient aware that she may have no visitors and driver will need to wait in the car due to COVID-19 restrictions.   The patient verbalizes understanding of the above.   The patient is aware to call the office should she have further questions.

## 2019-05-17 NOTE — Telephone Encounter (Signed)
Dr. Tasia Catchings has approved for port removal.

## 2019-05-17 NOTE — Telephone Encounter (Signed)
The patient wanted to know if Dr Bary Castilla had spoken with Dr Tasia Catchings about having her port removed at the time of her surgery on 06/03/19.

## 2019-05-20 ENCOUNTER — Other Ambulatory Visit: Payer: Managed Care, Other (non HMO)

## 2019-05-27 ENCOUNTER — Telehealth: Payer: Self-pay | Admitting: Plastic Surgery

## 2019-05-27 ENCOUNTER — Ambulatory Visit: Payer: Managed Care, Other (non HMO) | Admitting: Radiation Oncology

## 2019-05-27 NOTE — Telephone Encounter (Signed)

## 2019-05-28 ENCOUNTER — Encounter: Payer: Self-pay | Admitting: Plastic Surgery

## 2019-05-28 ENCOUNTER — Ambulatory Visit (INDEPENDENT_AMBULATORY_CARE_PROVIDER_SITE_OTHER): Payer: Managed Care, Other (non HMO) | Admitting: Plastic Surgery

## 2019-05-28 ENCOUNTER — Other Ambulatory Visit: Payer: Self-pay

## 2019-05-28 ENCOUNTER — Encounter: Payer: Managed Care, Other (non HMO) | Admitting: Plastic Surgery

## 2019-05-28 VITALS — BP 121/77 | HR 94 | Temp 98.4°F | Ht 67.0 in | Wt 188.0 lb

## 2019-05-28 DIAGNOSIS — C50811 Malignant neoplasm of overlapping sites of right female breast: Secondary | ICD-10-CM

## 2019-05-28 DIAGNOSIS — Z9011 Acquired absence of right breast and nipple: Secondary | ICD-10-CM

## 2019-05-28 DIAGNOSIS — Z170 Estrogen receptor positive status [ER+]: Secondary | ICD-10-CM

## 2019-05-28 MED ORDER — ONDANSETRON HCL 4 MG PO TABS: 4.0000 mg | ORAL_TABLET | Freq: Three times a day (TID) | ORAL | 0 refills | Status: AC | PRN

## 2019-05-28 MED ORDER — DIAZEPAM 2 MG PO TABS: 2.0000 mg | ORAL_TABLET | Freq: Two times a day (BID) | ORAL | 0 refills | Status: AC | PRN

## 2019-05-28 MED ORDER — CEPHALEXIN 500 MG PO CAPS: 500.0000 mg | ORAL_CAPSULE | Freq: Four times a day (QID) | ORAL | 0 refills | Status: AC

## 2019-05-28 MED ORDER — HYDROCODONE-ACETAMINOPHEN 5-325 MG PO TABS: 1.0000 | ORAL_TABLET | Freq: Four times a day (QID) | ORAL | 0 refills | Status: AC | PRN

## 2019-05-28 NOTE — H&P (View-Only) (Signed)
Patient ID: Theresa Chaney, female    DOB: [birth year], 38 y.o.   MRN: [id number]   Chief Complaint  Patient presents with  . Pre-op Exam    for double mast    The patient is a 38 year old white female here for her preoperative history and physical for surgery.  She is planning on undergoing removal of the right breast implant.  She had a mastectomy for invasive carcinoma.  It was estrogen positive and progesterone negative.  She then had radiation.  She has a tight feeling around the chest and does not want to proceed with any reconstruction.  We have spoken about this several times and she assures me that she is confident in her decision.  She is planning on having a left mastectomy as well.  The incision on the right is well-healed.  The area is firm.  She also complains of pain in the lateral breast area.   Review of Systems  Constitutional: Negative for activity change and appetite change.  HENT: Negative.   Eyes: Negative.   Respiratory: Positive for chest tightness. Negative for shortness of breath.   Cardiovascular: Negative for leg swelling.  Gastrointestinal: Negative for abdominal distention.  Endocrine: Negative.   Genitourinary: Negative.   Musculoskeletal: Negative.   Neurological: Negative.   Hematological: Negative.   Psychiatric/Behavioral: Negative.     Past Medical History:  Diagnosis Date  . Anemia   . BRCA negative 11/26/2017  . Breast cancer ([place]) 10/11/2017   Multifocal, ER positive, PR negative, HER-2 negative. ypT3 ypN2a 8.7 cm, 4/15 nodes  . Cardiomyopathy ([place])    a. 10/2017 Echo: EF 60-65%, no rwma, Gr1 DD, nl RV size/fxn; b. 04/2018 Echo: EF 55-60%, no rwma, Nl RV size/fxn; c. 08/2018 Echo: EF 45%, diff HK, ? HK of antsept wall. Gr1 DD. Mild MR. Mild LAE/RAE. Mod dil RV.   [name] Chronic bronchitis ([place]) 11/2017  . COPD (chronic obstructive pulmonary disease) ([place])    MILD PER CXR  . Depression   . Family history of cancer   . GERD (gastroesophageal  reflux disease)   . Headache    MIGRAINES  . Heart murmur    ASYMPTOMATIC  . Personal history of chemotherapy    current for right breast ca    Past Surgical History:  Procedure Laterality Date  . AXILLARY LYMPH NODE DISSECTION Right 03/19/2018   Procedure: AXILLARY LYMPH NODE DISSECTION;  Surgeon: Robert Bellow, MD;  Location: [facility];  Service: General;  Laterality: Right;  . BREAST BIOPSY Right 10/11/2017   12:30 posterior coil clip invasive mammary carcinoma  . BREAST BIOPSY Right 10/11/2017   11:30 middle depth ribbon clip DCIS  . BREAST BIOPSY Right 10/11/2017   5:30 anterior depth x shape invasive ductal carcinoma  . BREAST RECONSTRUCTION WITH PLACEMENT OF TISSUE EXPANDER AND FLEX HD (ACELLULAR HYDRATED DERMIS) Right 03/19/2018   Procedure: BREAST RECONSTRUCTION WITH PLACEMENT OF TISSUE EXPANDER AND FLEX HD (ACELLULAR HYDRATED DERMIS);  Surgeon: Wallace Going, DO;  Location: [facility];  Service: Plastics;  Laterality: Right;  . LAPAROSCOPIC BILATERAL SALPINGO OOPHERECTOMY Bilateral 03/19/2018   Procedure: LAPAROSCOPIC BILATERAL SALPINGO OOPHORECTOMY;  Surgeon: Benjaman Kindler, MD;  Location: [facility];  Service: Gynecology;  Laterality: Bilateral;  . MASTECTOMY Right 03/2018  . MASTECTOMY W/ SENTINEL NODE BIOPSY Right 03/19/2018   Procedure: MASTECTOMY WITH SENTINEL LYMPH NODE BIOPSY;  Surgeon: Robert Bellow, MD;  Location: [facility];  Service: General;  Laterality: Right;  . PORTACATH PLACEMENT Left 10/24/2017  Procedure: INSERTION PORT-A-CATH;  Surgeon: Robert Bellow, MD;  Location: [facility];  Service: General;  Laterality: Left;  . REMOVAL OF TISSUE EXPANDER AND PLACEMENT OF IMPLANT Right 07/20/2018   Procedure: REMOVAL OF RIGHT BREAST TISSUE EXPANDER AND PLACEMENT OF IMPLANT;  Surgeon: Wallace Going, DO;  Location: [facility];  Service: Plastics;  Laterality: Right;      Current Outpatient Medications:  .  albuterol (PROVENTIL HFA;VENTOLIN  HFA) 108 (90 Base) MCG/ACT inhaler, Inhale 2 puffs into the lungs every 6 (six) hours as needed for wheezing or shortness of breath (Cough)., Disp: 1 Inhaler, Rfl: 2 .  Calcium Carb-Cholecalciferol (CALCIUM 600 + D PO), Take 1 tablet daily by mouth., Disp: , Rfl:  .  escitalopram (LEXAPRO) 20 MG tablet, Take 1 tablet (20 mg total) by mouth daily., Disp: 90 tablet, Rfl: 1 .  esomeprazole (NEXIUM) 40 MG capsule, Take 40 mg by mouth every morning. , Disp: , Rfl:  .  HYDROcodone-acetaminophen (NORCO) 5-325 MG tablet, Take 1 tablet by mouth every 6 (six) hours as needed for moderate pain., Disp: 30 tablet, Rfl: 0 .  ibuprofen (ADVIL) 800 MG tablet, Take 800 mg by mouth every 8 (eight) hours as needed for moderate pain., Disp: , Rfl:  .  letrozole (FEMARA) 2.5 MG tablet, Take 1 tablet (2.5 mg total) by mouth daily., Disp: 90 tablet, Rfl: 0 .  loratadine (CLARITIN) 10 MG tablet, Take 10 mg by mouth daily. , Disp: , Rfl:  .  LORazepam (ATIVAN) 1 MG tablet, Take 1 mg by mouth 3 (three) times daily. , Disp: , Rfl:  .  meloxicam (MOBIC) 7.5 MG tablet, Take 15 mg by mouth daily., Disp: , Rfl:  .  metoprolol succinate (TOPROL XL) 25 MG 24 hr tablet, Take 1.5 tablets (37.5 mg total) by mouth daily., Disp: 135 tablet, Rfl: 3 .  nortriptyline (PAMELOR) 10 MG capsule, Take 20 mg by mouth at bedtime. , Disp: , Rfl:  .  pregabalin (LYRICA) 150 MG capsule, Take 150 mg by mouth 2 (two) times daily., Disp: , Rfl:  .  pregabalin (LYRICA) 300 MG capsule, Take 1 capsule (300 mg total) by mouth daily., Disp: 30 capsule, Rfl: 3 .  promethazine (PHENERGAN) 25 MG tablet, Take 1 tablet (25 mg total) by mouth every 6 (six) hours as needed for nausea or vomiting., Disp: 30 tablet, Rfl: 3 .  pyridOXINE (VITAMIN B-6) 100 MG tablet, Take 1 tablet (100 mg total) by mouth daily., Disp: 30 tablet, Rfl: 1 .  vitamin C (ASCORBIC ACID) 500 MG tablet, Take 500 mg by mouth daily., Disp: , Rfl:  No current facility-administered medications  for this visit.   Facility-Administered Medications Ordered in Other Visits:  .  heparin lock flush 100 unit/mL, 500 Units, Intravenous, Once, Earlie Server, MD .  sodium chloride flush (NS) 0.9 % injection 10 mL, 10 mL, Intravenous, PRN, Earlie Server, MD, 10 mL at 11/19/18 1249   Objective:   Vitals:   05/28/19 0949  BP: 121/77  Pulse: 94  Temp: 98.4 F (36.9 C)  SpO2: 98%    Physical Exam Vitals signs and nursing note reviewed.  Constitutional:      Appearance: Normal appearance.  HENT:     Head: Normocephalic and atraumatic.  Cardiovascular:     Rate and Rhythm: Normal rate.  Pulmonary:     Effort: Pulmonary effort is normal. No respiratory distress.  Abdominal:     General: Abdomen is flat. There is no distension.  Neurological:     General: No focal deficit present.     Mental Status: She is alert and oriented to person, place, and time.  Psychiatric:        Mood and Affect: Mood normal.        Behavior: Behavior normal.     Assessment & Plan:  Malignant neoplasm of overlapping sites of right breast in female, estrogen receptor positive ([place])  Acquired absence of right breast and nipple Plan for removal of right breast expander and excess tissue.  Prescriptions sent to pharmacy.  The risks that can be encountered with and after removal of a breast expander placement were discussed and include the following but not limited to these: bleeding, infection, delayed healing, anesthesia risks, skin sensation changes, injury to structures including nerves, blood vessels, and muscles which may be temporary or permanent, allergies to tape, suture materials and glues, blood products, topical preparations or injected agents, skin contour irregularities, skin discoloration and swelling, deep vein thrombosis, cardiac and pulmonary complications, pain, which may persist, fluid accumulation, wrinkling of the skin, persistent pain, formation of tight scar tissue and possible need for  revisional surgery or staged procedures.     [place], DO

## 2019-05-28 NOTE — Progress Notes (Signed)
Patient ID: Theresa Chaney, female    DOB: [birth year], 38 y.o.   MRN: [id number]   Chief Complaint  Patient presents with  . Pre-op Exam    for double mast    The patient is a 38 year old white female here for her preoperative history and physical for surgery.  She is planning on undergoing removal of the right breast implant.  She had a mastectomy for invasive carcinoma.  It was estrogen positive and progesterone negative.  She then had radiation.  She has a tight feeling around the chest and does not want to proceed with any reconstruction.  We have spoken about this several times and she assures me that she is confident in her decision.  She is planning on having a left mastectomy as well.  The incision on the right is well-healed.  The area is firm.  She also complains of pain in the lateral breast area.   Review of Systems  Constitutional: Negative for activity change and appetite change.  HENT: Negative.   Eyes: Negative.   Respiratory: Positive for chest tightness. Negative for shortness of breath.   Cardiovascular: Negative for leg swelling.  Gastrointestinal: Negative for abdominal distention.  Endocrine: Negative.   Genitourinary: Negative.   Musculoskeletal: Negative.   Neurological: Negative.   Hematological: Negative.   Psychiatric/Behavioral: Negative.     Past Medical History:  Diagnosis Date  . Anemia   . BRCA negative 11/26/2017  . Breast cancer ([place]) 10/11/2017   Multifocal, ER positive, PR negative, HER-2 negative. ypT3 ypN2a 8.7 cm, 4/15 nodes  . Cardiomyopathy ([place])    a. 10/2017 Echo: EF 60-65%, no rwma, Gr1 DD, nl RV size/fxn; b. 04/2018 Echo: EF 55-60%, no rwma, Nl RV size/fxn; c. 08/2018 Echo: EF 45%, diff HK, ? HK of antsept wall. Gr1 DD. Mild MR. Mild LAE/RAE. Mod dil RV.   [name] Chronic bronchitis ([place] 11/2017  . COPD (chronic obstructive pulmonary disease) ([place])    MILD PER CXR  . Depression   . Family history of cancer   . GERD (gastroesophageal  reflux disease)   . Headache    MIGRAINES  . Heart murmur    ASYMPTOMATIC  . Personal history of chemotherapy    current for right breast ca    Past Surgical History:  Procedure Laterality Date  . AXILLARY LYMPH NODE DISSECTION Right 03/19/2018   Procedure: AXILLARY LYMPH NODE DISSECTION;  Surgeon: Robert Bellow, MD;  Location: [facility];  Service: General;  Laterality: Right;  . BREAST BIOPSY Right 10/11/2017   12:30 posterior coil clip invasive mammary carcinoma  . BREAST BIOPSY Right 10/11/2017   11:30 middle depth ribbon clip DCIS  . BREAST BIOPSY Right 10/11/2017   5:30 anterior depth x shape invasive ductal carcinoma  . BREAST RECONSTRUCTION WITH PLACEMENT OF TISSUE EXPANDER AND FLEX HD (ACELLULAR HYDRATED DERMIS) Right 03/19/2018   Procedure: BREAST RECONSTRUCTION WITH PLACEMENT OF TISSUE EXPANDER AND FLEX HD (ACELLULAR HYDRATED DERMIS);  Surgeon: Wallace Going, DO;  Location: [facility];  Service: Plastics;  Laterality: Right;  . LAPAROSCOPIC BILATERAL SALPINGO OOPHERECTOMY Bilateral 03/19/2018   Procedure: LAPAROSCOPIC BILATERAL SALPINGO OOPHORECTOMY;  Surgeon: Benjaman Kindler, MD;  Location: [facility];  Service: Gynecology;  Laterality: Bilateral;  . MASTECTOMY Right 03/2018  . MASTECTOMY W/ SENTINEL NODE BIOPSY Right 03/19/2018   Procedure: MASTECTOMY WITH SENTINEL LYMPH NODE BIOPSY;  Surgeon: Robert Bellow, MD;  Location: [facility];  Service: General;  Laterality: Right;  . PORTACATH PLACEMENT Left 10/24/2017  Procedure: INSERTION PORT-A-CATH;  Surgeon: Robert Bellow, MD;  Location: [facility];  Service: General;  Laterality: Left;  . REMOVAL OF TISSUE EXPANDER AND PLACEMENT OF IMPLANT Right 07/20/2018   Procedure: REMOVAL OF RIGHT BREAST TISSUE EXPANDER AND PLACEMENT OF IMPLANT;  Surgeon: Wallace Going, DO;  Location: [place];  Service: Plastics;  Laterality: Right;      Current Outpatient Medications:  .  albuterol (PROVENTIL HFA;VENTOLIN  HFA) 108 (90 Base) MCG/ACT inhaler, Inhale 2 puffs into the lungs every 6 (six) hours as needed for wheezing or shortness of breath (Cough)., Disp: 1 Inhaler, Rfl: 2 .  Calcium Carb-Cholecalciferol (CALCIUM 600 + D PO), Take 1 tablet daily by mouth., Disp: , Rfl:  .  escitalopram (LEXAPRO) 20 MG tablet, Take 1 tablet (20 mg total) by mouth daily., Disp: 90 tablet, Rfl: 1 .  esomeprazole (NEXIUM) 40 MG capsule, Take 40 mg by mouth every morning. , Disp: , Rfl:  .  HYDROcodone-acetaminophen (NORCO) 5-325 MG tablet, Take 1 tablet by mouth every 6 (six) hours as needed for moderate pain., Disp: 30 tablet, Rfl: 0 .  ibuprofen (ADVIL) 800 MG tablet, Take 800 mg by mouth every 8 (eight) hours as needed for moderate pain., Disp: , Rfl:  .  letrozole (FEMARA) 2.5 MG tablet, Take 1 tablet (2.5 mg total) by mouth daily., Disp: 90 tablet, Rfl: 0 .  loratadine (CLARITIN) 10 MG tablet, Take 10 mg by mouth daily. , Disp: , Rfl:  .  LORazepam (ATIVAN) 1 MG tablet, Take 1 mg by mouth 3 (three) times daily. , Disp: , Rfl:  .  meloxicam (MOBIC) 7.5 MG tablet, Take 15 mg by mouth daily., Disp: , Rfl:  .  metoprolol succinate (TOPROL XL) 25 MG 24 hr tablet, Take 1.5 tablets (37.5 mg total) by mouth daily., Disp: 135 tablet, Rfl: 3 .  nortriptyline (PAMELOR) 10 MG capsule, Take 20 mg by mouth at bedtime. , Disp: , Rfl:  .  pregabalin (LYRICA) 150 MG capsule, Take 150 mg by mouth 2 (two) times daily., Disp: , Rfl:  .  pregabalin (LYRICA) 300 MG capsule, Take 1 capsule (300 mg total) by mouth daily., Disp: 30 capsule, Rfl: 3 .  promethazine (PHENERGAN) 25 MG tablet, Take 1 tablet (25 mg total) by mouth every 6 (six) hours as needed for nausea or vomiting., Disp: 30 tablet, Rfl: 3 .  pyridOXINE (VITAMIN B-6) 100 MG tablet, Take 1 tablet (100 mg total) by mouth daily., Disp: 30 tablet, Rfl: 1 .  vitamin C (ASCORBIC ACID) 500 MG tablet, Take 500 mg by mouth daily., Disp: , Rfl:  No current facility-administered medications  for this visit.   Facility-Administered Medications Ordered in Other Visits:  .  heparin lock flush 100 unit/mL, 500 Units, Intravenous, Once, Earlie Server, MD .  sodium chloride flush (NS) 0.9 % injection 10 mL, 10 mL, Intravenous, PRN, Earlie Server, MD, 10 mL at 11/19/18 1249   Objective:   Vitals:   05/28/19 0949  BP: 121/77  Pulse: 94  Temp: 98.4 F (36.9 C)  SpO2: 98%    Physical Exam Vitals signs and nursing note reviewed.  Constitutional:      Appearance: Normal appearance.  HENT:     Head: Normocephalic and atraumatic.  Cardiovascular:     Rate and Rhythm: Normal rate.  Pulmonary:     Effort: Pulmonary effort is normal. No respiratory distress.  Abdominal:     General: Abdomen is flat. There is no distension.  Neurological:     General: No focal deficit present.     Mental Status: She is alert and oriented to person, place, and time.  Psychiatric:        Mood and Affect: Mood normal.        Behavior: Behavior normal.     Assessment & Plan:  Malignant neoplasm of overlapping sites of right breast in female, estrogen receptor positive ([place])  Acquired absence of right breast and nipple Plan for removal of right breast expander and excess tissue.  Prescriptions sent to pharmacy.  The risks that can be encountered with and after removal of a breast expander placement were discussed and include the following but not limited to these: bleeding, infection, delayed healing, anesthesia risks, skin sensation changes, injury to structures including nerves, blood vessels, and muscles which may be temporary or permanent, allergies to tape, suture materials and glues, blood products, topical preparations or injected agents, skin contour irregularities, skin discoloration and swelling, deep vein thrombosis, cardiac and pulmonary complications, pain, which may persist, fluid accumulation, wrinkling of the skin, persistent pain, formation of tight scar tissue and possible need for  revisional surgery or staged procedures.     Ravanna, DO

## 2019-05-30 ENCOUNTER — Other Ambulatory Visit: Payer: Self-pay | Admitting: General Surgery

## 2019-05-30 ENCOUNTER — Other Ambulatory Visit: Payer: Self-pay

## 2019-05-30 ENCOUNTER — Encounter
Admission: RE | Admit: 2019-05-30 | Discharge: 2019-05-30 | Disposition: A | Discharge status: Home / Self Care | Payer: Managed Care, Other (non HMO) | Source: Ambulatory Visit | Attending: General Surgery | Admitting: General Surgery

## 2019-05-30 DIAGNOSIS — Z1159 Encounter for screening for other viral diseases: Secondary | ICD-10-CM | POA: Insufficient documentation

## 2019-05-30 NOTE — Pre-Procedure Instructions (Signed)
Dr Dillingham's office notified of need for order for consent for surgery.

## 2019-05-30 NOTE — Pre-Procedure Instructions (Signed)
Dr Dwyane Luo office notified of need for consent order for port removal

## 2019-05-30 NOTE — Patient Instructions (Signed)
Your procedure is scheduled on: June 03, 2019 [place] Report to Day Surgery on the 2nd floor of the [facility]. To find out your arrival time, please call [phone] between 1PM - 3PM on: Friday May 31, 2019  REMEMBER: Instructions that are not followed completely may result in serious medical risk, up to and including death; or upon the discretion of your surgeon and anesthesiologist your surgery may need to be rescheduled.  Do not eat food after midnight the night before surgery.  No gum chewing, lozengers or hard candies.  You may however, drink CLEAR liquids up to 2 hours before you are scheduled to arrive for your surgery. Do not drink anything within 2 hours of the start of your surgery.  Clear liquids include: - water  - apple juice without pulp -CLEAR  gatorade - black coffee or tea (Do NOT add milk or creamers to the coffee or tea) Do NOT drink anything that is not on this list.  Type 1 and Type 2 diabetics should only drink water.    No Alcohol for 24 hours before or after surgery.  No Smoking including e-cigarettes for 24 hours prior to surgery.  No chewable tobacco products for at least 6 hours prior to surgery.  No nicotine patches on the day of surgery.  On the morning of surgery brush your teeth with toothpaste and water, you may rinse your mouth with mouthwash if you wish. Do not swallow any toothpaste or mouthwash.  Notify your doctor if there is any change in your medical condition (cold, fever, infection).  Do not wear jewelry, make-up, hairpins, clips or nail polish.  Do not wear lotions, powders, or perfumes  OR DEODORANT   Do not shave 48 hours prior to surgery.   Contacts and dentures may not be worn into surgery.  Do not bring valuables to the hospital, including drivers license, insurance or credit cards.  [facility] is not responsible for any belongings or valuables.   TAKE THESE MEDICATIONS THE MORNING OF SURGERY: [place] (take one the  night before and one on the morning of surgery - helps to prevent nausea after surgery.) LEXAPRO FEMARA CLARITIN METOPROLOL LYRICA   Use CHG Soap  as directed on instruction sheet.  Use inhalers on the day of surgery    Stop Anti-inflammatories (NSAIDS) such as Advil, Aleve, Ibuprofen, Motrin, Naproxen, Naprosyn MELOXICAM and ASPIRIN OR  Aspirin based products such as Excedrin, [place]ys Powder, BC Powder. (May take Tylenol or Acetaminophen if needed.)  Stop ANY OVER THE COUNTER supplements until after surgery - VITAMIN C (May continue Vitamin D, Vitamin B, and multivitamin.)  Wear comfortable clothing (specific to your surgery type) to the hospital.  Plan for stool softeners for home use.  If you are being admitted to the hospital overnight, leave your suitcase in the car. After surgery it may be brought to your room.  If you are being discharged the day of surgery, you will not be allowed to drive home. You will need a responsible adult to drive you home and stay with you that night.   If you are taking public transportation, you will need to have a responsible adult with you. Please confirm with your physician that it is acceptable to use public transportation.   Please call [phone] if you have any questions about these instructions.

## 2019-05-31 LAB — NOVEL CORONAVIRUS, NAA (HOSP ORDER, SEND-OUT TO REF LAB; TAT 18-24 HRS): SARS-CoV-2, NAA: NOT DETECTED

## 2019-06-02 MED ORDER — CEFAZOLIN SODIUM-DEXTROSE 2-4 GM/100ML-% IV SOLN
2.0000 g | INTRAVENOUS | Status: AC
  Administered 2019-06-03: 08:00:00 2 g via INTRAVENOUS

## 2019-06-03 ENCOUNTER — Encounter: Payer: Self-pay | Admitting: *Deleted

## 2019-06-03 ENCOUNTER — Ambulatory Visit
Admission: RE | Admit: 2019-06-03 | Discharge: 2019-06-03 | Disposition: A | Discharge status: Home / Self Care | Payer: Managed Care, Other (non HMO) | Attending: General Surgery | Admitting: General Surgery

## 2019-06-03 ENCOUNTER — Encounter
Admission: RE | Disposition: A | Discharge status: Home / Self Care | Payer: Self-pay | Source: Home / Self Care | Attending: General Surgery

## 2019-06-03 ENCOUNTER — Ambulatory Visit: Payer: Managed Care, Other (non HMO) | Admitting: Certified Registered Nurse Anesthetist

## 2019-06-03 DIAGNOSIS — F329 Major depressive disorder, single episode, unspecified: Secondary | ICD-10-CM | POA: Insufficient documentation

## 2019-06-03 DIAGNOSIS — Z170 Estrogen receptor positive status [ER+]: Secondary | ICD-10-CM | POA: Insufficient documentation

## 2019-06-03 DIAGNOSIS — J449 Chronic obstructive pulmonary disease, unspecified: Secondary | ICD-10-CM | POA: Diagnosis not present

## 2019-06-03 DIAGNOSIS — Z79899 Other long term (current) drug therapy: Secondary | ICD-10-CM | POA: Diagnosis not present

## 2019-06-03 DIAGNOSIS — Z79811 Long term (current) use of aromatase inhibitors: Secondary | ICD-10-CM | POA: Diagnosis not present

## 2019-06-03 DIAGNOSIS — Z923 Personal history of irradiation: Secondary | ICD-10-CM | POA: Diagnosis not present

## 2019-06-03 DIAGNOSIS — N6042 Mammary duct ectasia of left breast: Secondary | ICD-10-CM | POA: Diagnosis not present

## 2019-06-03 DIAGNOSIS — Z4001 Encounter for prophylactic removal of breast: Secondary | ICD-10-CM | POA: Diagnosis not present

## 2019-06-03 DIAGNOSIS — Z45811 Encounter for adjustment or removal of right breast implant: Secondary | ICD-10-CM | POA: Diagnosis not present

## 2019-06-03 DIAGNOSIS — Z9011 Acquired absence of right breast and nipple: Secondary | ICD-10-CM | POA: Insufficient documentation

## 2019-06-03 DIAGNOSIS — C50811 Malignant neoplasm of overlapping sites of right female breast: Secondary | ICD-10-CM | POA: Diagnosis not present

## 2019-06-03 DIAGNOSIS — Z791 Long term (current) use of non-steroidal anti-inflammatories (NSAID): Secondary | ICD-10-CM | POA: Diagnosis not present

## 2019-06-03 DIAGNOSIS — K219 Gastro-esophageal reflux disease without esophagitis: Secondary | ICD-10-CM | POA: Insufficient documentation

## 2019-06-03 DIAGNOSIS — Z853 Personal history of malignant neoplasm of breast: Secondary | ICD-10-CM | POA: Diagnosis not present

## 2019-06-03 DIAGNOSIS — F17200 Nicotine dependence, unspecified, uncomplicated: Secondary | ICD-10-CM | POA: Diagnosis not present

## 2019-06-03 HISTORY — PX: PORT-A-CATH REMOVAL: SHX5289

## 2019-06-03 HISTORY — PX: SIMPLE MASTECTOMY WITH AXILLARY SENTINEL NODE BIOPSY: SHX6098

## 2019-06-03 HISTORY — PX: BREAST IMPLANT REMOVAL: SHX5361

## 2019-06-03 SURGERY — SIMPLE MASTECTOMY
Anesthesia: General | Laterality: Right

## 2019-06-03 MED ORDER — ONDANSETRON HCL 4 MG/2ML IJ SOLN
INTRAMUSCULAR | Status: AC
  Filled 2019-06-03: qty 2

## 2019-06-03 MED ORDER — LIDOCAINE-EPINEPHRINE 1 %-1:100000 IJ SOLN
INTRAMUSCULAR | Status: AC
  Filled 2019-06-03: qty 1

## 2019-06-03 MED ORDER — DEXAMETHASONE SODIUM PHOSPHATE 10 MG/ML IJ SOLN
INTRAMUSCULAR | Status: DC | PRN
  Administered 2019-06-03: 10 mg via INTRAVENOUS

## 2019-06-03 MED ORDER — CHLORHEXIDINE GLUCONATE CLOTH 2 % EX PADS: 6.0000 | MEDICATED_PAD | Freq: Once | CUTANEOUS | Status: DC

## 2019-06-03 MED ORDER — LIDOCAINE-EPINEPHRINE (PF) 1 %-1:200000 IJ SOLN
INTRAMUSCULAR | Status: AC
  Filled 2019-06-03: qty 30

## 2019-06-03 MED ORDER — MIDAZOLAM HCL 2 MG/2ML IJ SOLN
INTRAMUSCULAR | Status: AC
  Filled 2019-06-03: qty 2

## 2019-06-03 MED ORDER — FENTANYL CITRATE (PF) 100 MCG/2ML IJ SOLN: 25.0000 ug | INTRAMUSCULAR | Status: DC | PRN

## 2019-06-03 MED ORDER — PROPOFOL 10 MG/ML IV BOLUS
INTRAVENOUS | Status: AC
  Filled 2019-06-03: qty 20

## 2019-06-03 MED ORDER — BACITRACIN 50000 UNITS IM SOLR
INTRAMUSCULAR | Status: AC
  Filled 2019-06-03: qty 1

## 2019-06-03 MED ORDER — MIDAZOLAM HCL 2 MG/2ML IJ SOLN
INTRAMUSCULAR | Status: DC | PRN
  Administered 2019-06-03: 2 mg via INTRAVENOUS

## 2019-06-03 MED ORDER — FENTANYL CITRATE (PF) 100 MCG/2ML IJ SOLN
INTRAMUSCULAR | Status: AC
  Filled 2019-06-03: qty 2

## 2019-06-03 MED ORDER — LIDOCAINE-EPINEPHRINE (PF) 1 %-1:200000 IJ SOLN
INTRAMUSCULAR | Status: DC | PRN
  Administered 2019-06-03: 6 mL

## 2019-06-03 MED ORDER — FENTANYL CITRATE (PF) 100 MCG/2ML IJ SOLN
INTRAMUSCULAR | Status: DC | PRN
  Administered 2019-06-03: 50 ug via INTRAVENOUS
  Administered 2019-06-03: 25 ug via INTRAVENOUS
  Administered 2019-06-03: 50 ug via INTRAVENOUS
  Administered 2019-06-03: 25 ug via INTRAVENOUS

## 2019-06-03 MED ORDER — GLYCOPYRROLATE 0.2 MG/ML IJ SOLN
INTRAMUSCULAR | Status: DC | PRN
  Administered 2019-06-03: 0.2 mg via INTRAVENOUS

## 2019-06-03 MED ORDER — METHYLENE BLUE 0.5 % INJ SOLN
INTRAVENOUS | Status: AC
  Filled 2019-06-03: qty 10

## 2019-06-03 MED ORDER — DEXAMETHASONE SODIUM PHOSPHATE 10 MG/ML IJ SOLN
INTRAMUSCULAR | Status: AC
  Filled 2019-06-03: qty 1

## 2019-06-03 MED ORDER — LIDOCAINE HCL (CARDIAC) PF 100 MG/5ML IV SOSY
PREFILLED_SYRINGE | INTRAVENOUS | Status: DC | PRN
  Administered 2019-06-03: 80 mg via INTRAVENOUS

## 2019-06-03 MED ORDER — PROPOFOL 10 MG/ML IV BOLUS
INTRAVENOUS | Status: DC | PRN
  Administered 2019-06-03: 50 mg via INTRAVENOUS
  Administered 2019-06-03: 200 mg via INTRAVENOUS

## 2019-06-03 MED ORDER — ONDANSETRON HCL 4 MG/2ML IJ SOLN
INTRAMUSCULAR | Status: DC | PRN
  Administered 2019-06-03: 4 mg via INTRAVENOUS

## 2019-06-03 MED ORDER — CEFAZOLIN SODIUM-DEXTROSE 2-4 GM/100ML-% IV SOLN
INTRAVENOUS | Status: AC
  Filled 2019-06-03: qty 100

## 2019-06-03 MED ORDER — LACTATED RINGERS IV SOLN
INTRAVENOUS | Status: DC
  Administered 2019-06-03: 07:00:00 via INTRAVENOUS

## 2019-06-03 MED ORDER — DEXMEDETOMIDINE HCL IN NACL 200 MCG/50ML IV SOLN
INTRAVENOUS | Status: DC | PRN
  Administered 2019-06-03: 12 ug via INTRAVENOUS

## 2019-06-03 MED ORDER — ACETAMINOPHEN 10 MG/ML IV SOLN
INTRAVENOUS | Status: DC | PRN
  Administered 2019-06-03: 1000 mg via INTRAVENOUS

## 2019-06-03 MED ORDER — PROMETHAZINE HCL 25 MG/ML IJ SOLN: 6.2500 mg | INTRAMUSCULAR | Status: DC | PRN

## 2019-06-03 MED ORDER — HYDROCODONE-ACETAMINOPHEN 5-325 MG PO TABS: 1.0000 | ORAL_TABLET | ORAL | 0 refills | Status: DC | PRN

## 2019-06-03 MED ORDER — SEVOFLURANE IN SOLN
RESPIRATORY_TRACT | Status: AC
  Filled 2019-06-03: qty 250

## 2019-06-03 MED ORDER — KETOROLAC TROMETHAMINE 30 MG/ML IJ SOLN
INTRAMUSCULAR | Status: DC | PRN
  Administered 2019-06-03: 30 mg via INTRAVENOUS

## 2019-06-03 MED ORDER — BUPIVACAINE-EPINEPHRINE (PF) 0.5% -1:200000 IJ SOLN
INTRAMUSCULAR | Status: AC
  Filled 2019-06-03: qty 30

## 2019-06-03 MED ORDER — EPHEDRINE SULFATE 50 MG/ML IJ SOLN
INTRAMUSCULAR | Status: DC | PRN
  Administered 2019-06-03: 5 mg via INTRAVENOUS
  Administered 2019-06-03: 10 mg via INTRAVENOUS

## 2019-06-03 MED ORDER — ACETAMINOPHEN 10 MG/ML IV SOLN
INTRAVENOUS | Status: AC
  Filled 2019-06-03: qty 100

## 2019-06-03 MED ORDER — BUPIVACAINE-EPINEPHRINE (PF) 0.25% -1:200000 IJ SOLN
INTRAMUSCULAR | Status: AC
  Filled 2019-06-03: qty 30

## 2019-06-03 MED ORDER — METHYLENE BLUE 0.5 % INJ SOLN
INTRAVENOUS | Status: DC | PRN
  Administered 2019-06-03: 5 mL

## 2019-06-03 MED ORDER — PHENYLEPHRINE HCL (PRESSORS) 10 MG/ML IV SOLN
INTRAVENOUS | Status: DC | PRN
  Administered 2019-06-03 (×2): 50 ug via INTRAVENOUS

## 2019-06-03 SURGICAL SUPPLY — 106 items
"PENCIL ELECTRO HAND CTR " (MISCELLANEOUS) ×2 IMPLANT
APPLIER CLIP 11 MED OPEN (CLIP)
APPLIER CLIP 13 LRG OPEN (CLIP)
BAG BIOHAZARD 6X9 CLR ZIPLOCK (MISCELLANEOUS) ×2 IMPLANT
BAG DECANTER FOR FLEXI CONT (MISCELLANEOUS) ×2 IMPLANT
BINDER BREAST LRG (GAUZE/BANDAGES/DRESSINGS) IMPLANT
BINDER BREAST MEDIUM (GAUZE/BANDAGES/DRESSINGS) IMPLANT
BINDER BREAST XLRG (GAUZE/BANDAGES/DRESSINGS) IMPLANT
BINDER BREAST XXLRG (GAUZE/BANDAGES/DRESSINGS) IMPLANT
BIOPATCH RED 1 DISK 7.0 (GAUZE/BANDAGES/DRESSINGS) IMPLANT
BLADE PHOTON ILLUMINATED (MISCELLANEOUS) IMPLANT
BLADE SURG 15 STRL LF DISP TIS (BLADE) ×2 IMPLANT
BLADE SURG 15 STRL SS (BLADE) ×1
BLADE SURG 15 STRL SS SAFETY (BLADE) ×3 IMPLANT
BULB RESERV EVAC DRAIN JP 100C (MISCELLANEOUS) ×4 IMPLANT
CANISTER SUCT 1200ML W/VALVE (MISCELLANEOUS) ×7 IMPLANT
CHLORAPREP W/TINT 26 (MISCELLANEOUS) ×6 IMPLANT
CLIP APPLIE 11 MED OPEN (CLIP) IMPLANT
CLIP APPLIE 13 LRG OPEN (CLIP) IMPLANT
CNTNR SPEC 2.5X3XGRAD LEK (MISCELLANEOUS) ×6
CONT SPEC 4OZ STER OR WHT (MISCELLANEOUS) ×3
CONTAINER SPEC 2.5X3XGRAD LEK (MISCELLANEOUS) ×6 IMPLANT
COVER BACK TABLE 60X90IN (DRAPES) ×2 IMPLANT
COVER LIGHT HANDLE STERIS (MISCELLANEOUS) ×6 IMPLANT
COVER MAYO STAND STRL (DRAPES) ×2 IMPLANT
COVER PROBE FLX POLY STRL (MISCELLANEOUS) ×2 IMPLANT
COVER WAND RF STERILE (DRAPES) ×2 IMPLANT
DECANTER SPIKE VIAL GLASS SM (MISCELLANEOUS) IMPLANT
DERMABOND ADVANCED (GAUZE/BANDAGES/DRESSINGS)
DERMABOND ADVANCED .7 DNX12 (GAUZE/BANDAGES/DRESSINGS) IMPLANT
DEVICE DUBIN SPECIMEN MAMMOGRA (MISCELLANEOUS) IMPLANT
DRAIN CHANNEL 19F RND (DRAIN) IMPLANT
DRAIN CHANNEL JP 15F RND 16 (MISCELLANEOUS) ×4 IMPLANT
DRAPE LAPAROSCOPIC ABDOMINAL (DRAPES) ×2 IMPLANT
DRAPE LAPAROTOMY TRNSV 106X77 (MISCELLANEOUS) ×3 IMPLANT
DRSG GAUZE FLUFF 36X18 (GAUZE/BANDAGES/DRESSINGS) ×6 IMPLANT
DRSG TEGADERM 2-3/8X2-3/4 SM (GAUZE/BANDAGES/DRESSINGS) ×3 IMPLANT
DRSG TELFA 3X8 NADH (GAUZE/BANDAGES/DRESSINGS) ×3 IMPLANT
DRSG TELFA 4X3 1S NADH ST (GAUZE/BANDAGES/DRESSINGS) ×3 IMPLANT
ELECT BLADE 4.0 EZ CLEAN MEGAD (MISCELLANEOUS) ×3
ELECT CAUTERY BLADE TIP 2.5 (TIP) ×3
ELECT COATED BLADE 2.86 ST (ELECTRODE) ×2 IMPLANT
ELECT REM PT RETURN 9FT ADLT (ELECTROSURGICAL) ×6
ELECTRODE BLDE 4.0 EZ CLN MEGD (MISCELLANEOUS) ×2 IMPLANT
ELECTRODE CAUTERY BLDE TIP 2.5 (TIP) ×2 IMPLANT
ELECTRODE REM PT RTRN 9FT ADLT (ELECTROSURGICAL) ×4 IMPLANT
GAUZE SPONGE 4X4 12PLY STRL (GAUZE/BANDAGES/DRESSINGS) ×2 IMPLANT
GAUZE SPONGE 4X4 12PLY STRL LF (GAUZE/BANDAGES/DRESSINGS) IMPLANT
GLOVE BIO SURGEON STRL SZ 6.5 (GLOVE) ×4 IMPLANT
GLOVE BIO SURGEON STRL SZ7 (GLOVE) ×6 IMPLANT
GLOVE BIO SURGEON STRL SZ7.5 (GLOVE) ×5 IMPLANT
GLOVE INDICATOR 8.0 STRL GRN (GLOVE) ×5 IMPLANT
GOWN STRL REUS W/ TWL LRG LVL3 (GOWN DISPOSABLE) ×8 IMPLANT
GOWN STRL REUS W/TWL LRG LVL3 (GOWN DISPOSABLE) ×6
HANDLE YANKAUER SUCT BULB TIP (MISCELLANEOUS) ×3 IMPLANT
IV NS 1000ML (IV SOLUTION)
IV NS 1000ML BAXH (IV SOLUTION) IMPLANT
KIT TURNOVER KIT A (KITS) ×3 IMPLANT
LABEL OR SOLS (LABEL) ×3 IMPLANT
NDL HYPO 25X1 1.5 SAFETY (NEEDLE) ×4 IMPLANT
NDL SAFETY ECLIPSE 18X1.5 (NEEDLE) ×2 IMPLANT
NEEDLE HYPO 18GX1.5 SHARP (NEEDLE) ×1
NEEDLE HYPO 25X1 1.5 SAFETY (NEEDLE) ×9 IMPLANT
NS IRRIG 500ML POUR BTL (IV SOLUTION) ×2 IMPLANT
PACK BASIN MINOR ARMC (MISCELLANEOUS) ×6 IMPLANT
PACK PORT-A-CATH (MISCELLANEOUS) ×2 IMPLANT
PAD ABD DERMACEA PRESS 5X9 (GAUZE/BANDAGES/DRESSINGS) ×6 IMPLANT
PAD DRESSING TELFA 3X8 NADH (GAUZE/BANDAGES/DRESSINGS) ×2 IMPLANT
PENCIL ELECTRO HAND CTR (MISCELLANEOUS) ×2 IMPLANT
PIN SAFETY STERILE (MISCELLANEOUS) IMPLANT
PIN SAFETY STRL (MISCELLANEOUS) ×3 IMPLANT
RETRACTOR RING XSMALL (MISCELLANEOUS) IMPLANT
RTRCTR WOUND ALEXIS 13CM XS SH (MISCELLANEOUS)
SHEARS FOC LG CVD HARMONIC 17C (MISCELLANEOUS) IMPLANT
SLEVE PROBE SENORX GAMMA FIND (MISCELLANEOUS) ×2 IMPLANT
SPONGE LAP 18X18 RF (DISPOSABLE) ×8 IMPLANT
STRIP CLOSURE SKIN 1/2X4 (GAUZE/BANDAGES/DRESSINGS) ×6 IMPLANT
STRIP SUTURE WOUND CLOSURE 1/2 (SUTURE) IMPLANT
SUT ETHILON 3-0 FS-10 30 BLK (SUTURE) ×3
SUT MNCRL AB 3-0 PS2 18 (SUTURE) ×2 IMPLANT
SUT MNCRL AB 4-0 PS2 18 (SUTURE) ×5 IMPLANT
SUT MON AB 3-0 SH 27 (SUTURE)
SUT MON AB 3-0 SH27 (SUTURE) ×2 IMPLANT
SUT MON AB 5-0 PS2 18 (SUTURE) IMPLANT
SUT PDS 3-0 CT2 (SUTURE)
SUT PDS AB 2-0 CT2 27 (SUTURE) IMPLANT
SUT PDS II 3-0 CT2 27 ABS (SUTURE) IMPLANT
SUT SILK 2 0 (SUTURE)
SUT SILK 2-0 30XBRD TIE 12 (SUTURE) ×2 IMPLANT
SUT SILK 3 0 PS 1 (SUTURE) IMPLANT
SUT VIC AB 2-0 CT1 27 (SUTURE) ×3
SUT VIC AB 2-0 CT1 TAPERPNT 27 (SUTURE) ×6 IMPLANT
SUT VIC AB 2-0 SH 27 (SUTURE) ×1
SUT VIC AB 2-0 SH 27XBRD (SUTURE) ×2 IMPLANT
SUT VIC AB 3-0 SH 27 (SUTURE) ×2
SUT VIC AB 3-0 SH 27X BRD (SUTURE) ×4 IMPLANT
SUT VIC AB 4-0 FS2 27 (SUTURE) ×3 IMPLANT
SUT VICRYL 4-0 PS2 18IN ABS (SUTURE) ×3 IMPLANT
SUT VICRYL+ 3-0 144IN (SUTURE) ×3 IMPLANT
SUTURE EHLN 3-0 FS-10 30 BLK (SUTURE) ×2 IMPLANT
SWABSTK COMLB BENZOIN TINCTURE (MISCELLANEOUS) ×3 IMPLANT
SYR 10ML LL (SYRINGE) ×3 IMPLANT
SYR BULB IRRIGATION 50ML (SYRINGE) ×2 IMPLANT
TAPE TRANSPORE STRL 2 31045 (GAUZE/BANDAGES/DRESSINGS) ×3 IMPLANT
TOWEL OR 17X26 4PK STRL BLUE (TOWEL DISPOSABLE) ×5 IMPLANT
TUBE CONNECTING 20X1/4 (TUBING) ×2 IMPLANT

## 2019-06-03 NOTE — Op Note (Signed)
Preoperative diagnosis: Desire for prophylactic mastectomy.  Postoperative diagnosis: Same.  Operative procedure: Left simple mastectomy with sentinel node biopsy.  Removal left subclavian PowerPort.  Operating Surgeon: Hervey Ard, MD.  Anesthesia: General by LMA.  Estimated blood loss: 30 cc.  Clinical note: This 37 year old woman had previously undergone a contralateral mastectomy with immediate reconstruction.  She is desired to have the right implant removed and procedure prophylactic mastectomy.  The right implant removal was completed by Hazel Sams, DO and will be dictated separately.  The patient received Ancef prior to the procedure.  SCD stockings for DVT prevention.  Operative note: With the patient under adequate general anesthesia the areola was cleansed with alcohol and 5 cc of 0.5% methylene blue instilled.  The breast chest and axilla was then prepped with ChloraPrep and draped.  A large transverse sheet was used and the 2 team approach employed.  Elliptical incision was outlined on the chest.  The skin was incised sharply and remaining dissection completed with electrocautery.  Hemostasis was with electrocautery and 3-0 Vicryl ties.  Margins of resection with the sternum medially, clavicle superiorly, rectus fascia inferiorly and the serratus muscle laterally.  A single blue node was identified in the axilla and sent separately as sentinel node #1.  No additional lymphatics were noted.  The breast was elevated off the underlying pectoralis muscle taking the fascia of the muscle with the specimen.  This was sent fresh to pathology per protocol.  The port was identified in the left upper inner quadrant of the mastectomy specimen.  Previously placed Prolene sutures removed and the port with an intact tip was removed without incident.  The wound was closed with running 2-0 Vicryl and a deep dermal layer in 2 segments.  Benzoin Steri-Strips were applied followed by Telfa,  fluff gauze and a compressive wrap.  The patient tolerated the procedures well and was taken to the recovery room in stable condition.

## 2019-06-03 NOTE — Anesthesia Procedure Notes (Signed)
Procedure Name: LMA Insertion Date/Time: 06/03/2019 7:34 AM Performed by: Johnna Acosta, CRNA Pre-anesthesia Checklist: Patient identified, Emergency Drugs available, Suction available, Patient being monitored and Timeout performed Patient Re-evaluated:Patient Re-evaluated prior to induction Oxygen Delivery Method: Circle system utilized Preoxygenation: Pre-oxygenation with 100% oxygen Induction Type: IV induction LMA: LMA inserted LMA Size: 4.5 Tube type: Oral Number of attempts: 1 Placement Confirmation: positive ETCO2 and breath sounds checked- equal and bilateral Tube secured with: Tape Dental Injury: Teeth and Oropharynx as per pre-operative assessment

## 2019-06-03 NOTE — Anesthesia Post-op Follow-up Note (Signed)
Anesthesia QCDR form completed.        

## 2019-06-03 NOTE — Discharge Instructions (Addendum)
AMBULATORY SURGERY  DISCHARGE INSTRUCTIONS   1) The drugs that you were given will stay in your system until tomorrow so for the next 24 hours you should not:  A) Drive an automobile B) Make any legal decisions C) Drink any alcoholic beverage   2) You may resume regular meals tomorrow.  Today it is better to start with liquids and gradually work up to solid foods.  You may eat anything you prefer, but it is better to start with liquids, then soup and crackers, and gradually work up to solid foods.   3) Please notify your doctor immediately if you have any unusual bleeding, trouble breathing, redness and pain at the surgery site, drainage, fever, or pain not relieved by medication.    4) Additional Instructions:        Please contact your physician with any problems or Same Day Surgery at [phone], Monday through Friday 6 am to 4 pm, or [place] at [facility] number at [phone].   INSTRUCTIONS FOR AFTER BREAST SURGERY   You are getting ready to undergo breast surgery.  You will likely have some questions about what to expect following your operation.  The following information will help you and your family understand what to expect when you are discharged from the hospital.  Following these guidelines will help ensure a smooth recovery and reduce risks of complications.   Postoperative instructions include information on: diet, wound care, medications and physical activity.  AFTER SURGERY You will spend one night in the hospital for observation.  DIET Breast surgery does not require a specific diet.  However, I have to mention that the healthier you eat the better your body can start healing. It is important to increasing your protein intake.  This means limiting the foods with sugar and carbohydrates.  Focus on vegetables and some meat.  If you have any liposuction during your procedure be sure to drink water.  If your urine is bright yellow, then it is  concentrated, and you need to drink more water.  As a general rule after surgery, you should have 8 ounces of water every hour while awake.  If you find you are persistently nauseated or unable to take in liquids let us know.  NO TOBACCO USE or EXPOSURE.  This will slow your healing process and increase the risk of a wound.  WOUND CARE Use fragrance free soap.  Dial, [place] and [place] are usually mild on the skin. If you have a drain clean with baby wipes until the drain is removed.  If you have steri-strips / tape directly attached to your skin leave them in place. It is OK to get these wet.  No baths, pools or hot tubs for two weeks. We close your incision to leave the smallest and best-looking scar. No ointment or creams on your incisions until given the go ahead.  Especially not Neosporin (Too many skin reactions with this one).  A few weeks after surgery you can use Mederma and start massaging the scar. We ask you to wear your binder or sports bra for the first 6 weeks around the clock, including while sleeping. This provides added comfort and helps reduce the fluid accumulation at the surgery site.  ACTIVITY No heavy lifting until cleared by the doctor.  This usually means no more than a half-gallon of milk.  It is OK to walk and climb stairs. In fact, moving your legs is very important to decrease your risk of a  blood clot.  It will also help keep you from getting deconditioned.  Every 1 to 2 hours get up and walk for 5 minutes. This will help with a quicker recovery back to normal.  Let pain be your guide so you don't do too much.  NO, you cannot do the spring cleaning and don't plan on taking care of anyone else.  This is your time for TLC.  You will be more comfortable if you sleep and rest with your head elevated either with a few pillows under you or in a recliner.  No stomach sleeping for a few months.  WORK Everyone returns to work at different times. As a rough guide, most people take at  least 1 - 2 weeks off prior to returning to work. If you need documentation for your job, bring the forms to your postoperative follow up visit.  DRIVING Arrange for someone to bring you home from the hospital.  You may be able to drive a few days after surgery but not while taking any narcotics or valium.  BOWEL MOVEMENTS Constipation can occur after anesthesia and while taking pain medication.  It is important to stay ahead for your comfort.  We recommend taking Milk of Magnesia (2 tablespoons; twice a day) while taking the pain pills.  SEROMA This is fluid your body tried to put in the surgical site.  This is normal but if it creates tight skinny skin let us know.  It usually decreases in a few weeks.  WHEN TO CALL Call your surgeon's office if any of the following occur:  Fever 101 degrees F or greater  Excessive bleeding or fluid from the incision site.  Pain that increases over time without aid from the medications  Redness, warmth, or pus draining from incision sites  Persistent nausea or inability to take in liquids  Severe misshapen area that underwent the operation.  Here are some resources:  1. Plastic surgery website: https://www.plasticsurgery.org/for-medical-professionals/education-and-resources/publications/breast-reconstruction-magazine 2. Breast Reconstruction Awareness Campaign:  HotelLives.co.nz 3. Plastic surgery Implant information:  https://www.plasticsurgery.org/patient-safety/breast-implant-safety

## 2019-06-03 NOTE — Op Note (Signed)
DATE OF OPERATION: 06/03/2019  LOCATION: [facility]  PREOPERATIVE DIAGNOSIS: acquired absence of right breast  POSTOPERATIVE DIAGNOSIS: Same  PROCEDURE: removal or right breast implant  SURGEON: Kaoru Rezendes Sanger Camren Lipsett, DO  EBL: 10 cc  CONDITION: Stable  COMPLICATIONS: None  INDICATION: The patient, Theresa Chaney, is a 38 y.o. female born on 30-Jul-1981, is here for treatment of acquired absence of right breast.  She underwent an implant placement and patient has decided to remove the implant.  She is undergoing a left mastectomy today as well.  PROCEDURE DETAILS:  The patient was seen prior to surgery and marked.  The IV antibiotics were given. The patient was taken to the operating room and given a general anesthetic. A standard time out was performed and all information was confirmed by those in the room. SCDs were placed.   The chest was prepped and draped.  The previous incision site was marked and injected with local.  The #15 blade was used to make an incision.  The bovie was used to dissect to the pectoralis muscle.  The junction between the muscle and the dermal matrix was noted and split.  The 173 cc silicone implant was removed. The muscle and dermal matrix were closed with the 3-0 Monocryl.  The decision was made to do this instead of complete resection so it will hold the muscle down and prevent window shading.  The bovie was used to perform capsulotomies for release and scar the capsule.  A drain was placed and secured to the chest skin with a 4-0 Silk. The deep layers were closed with the 4-0 Monocryl. The skin was re-approximated with the 5-0 Monocryl running subcuticular closure.  Derma bond was applied.  The patient was allowed to wake up and taken to recovery room in stable condition at the end of the case. The family was notified at the end of the case.

## 2019-06-03 NOTE — Transfer of Care (Signed)
Immediate Anesthesia Transfer of Care Note  Patient: Theresa Chaney  Procedure(s) Performed: SIMPLE MASTECTOMY LEFT (Left ) REMOVAL PORT-A-CATH (Left ) REMOVAL OF RIGHT BREAST IMPLANTS (Right )  Patient Location: PACU  Anesthesia Type:General  Level of Consciousness: sedated  Airway & Oxygen Therapy: Patient Spontanous Breathing and Patient connected to face mask oxygen  Post-op Assessment: Report given to RN and Post -op Vital signs reviewed and stable  Post vital signs: Reviewed and stable  Last Vitals:  Vitals Value Taken Time  BP 114/60 06/03/19 0909  Temp 36.6 C 06/03/19 0909  Pulse 93 06/03/19 0910  Resp 20 06/03/19 0910  SpO2 100 % 06/03/19 0910  Vitals shown include unvalidated device data.  Last Pain:  Vitals:   06/03/19 0615  TempSrc: Tympanic  PainSc: 0-No pain         Complications: No apparent anesthesia complications

## 2019-06-03 NOTE — Interval H&P Note (Signed)
History and Physical Interval Note:  06/03/2019 7:06 AM  Theresa Chaney  has presented today for surgery, with the diagnosis of BREAST CANCER C50.1, 212.0.  The various methods of treatment have been discussed with the patient and family. After consideration of risks, benefits and other options for treatment, the patient has consented to  Procedure(s): SIMPLE MASTECTOMY LEFT (Left) REMOVAL PORT-A-CATH (Left) REMOVAL OF RIGHT BREAST IMPLANTS (Right) as a surgical intervention.  The patient's history has been reviewed, patient examined, no change in status, stable for surgery.  I have reviewed the patient's chart and labs.  Questions were answered to the patient's satisfaction.     Loel Lofty Dillingham

## 2019-06-03 NOTE — Anesthesia Postprocedure Evaluation (Signed)
Anesthesia Post Note  Patient: Theresa Chaney  Procedure(s) Performed: SIMPLE MASTECTOMY LEFT (Left ) REMOVAL PORT-A-CATH (Left ) REMOVAL OF RIGHT BREAST IMPLANTS (Right )  Patient location during evaluation: PACU Anesthesia Type: General Level of consciousness: awake and alert Pain management: pain level controlled Vital Signs Assessment: post-procedure vital signs reviewed and stable Respiratory status: spontaneous breathing, nonlabored ventilation, respiratory function stable and patient connected to nasal cannula oxygen Cardiovascular status: blood pressure returned to baseline and stable Postop Assessment: no apparent nausea or vomiting Anesthetic complications: no     Last Vitals:  Vitals:   06/03/19 0943 06/03/19 0951  BP:  129/60  Pulse: 90 87  Resp: 14 16  Temp: 36.7 C (!) 36.3 C  SpO2: 97% 97%    Last Pain:  Vitals:   06/03/19 0951  TempSrc: Temporal  PainSc: 0-No pain                 Martha Clan

## 2019-06-03 NOTE — OR Nursing (Signed)
Per Dr. Bary Castilla, via tele OR nurse, ok to discharge pt home without his visit to postop.

## 2019-06-03 NOTE — H&P (Signed)
No change in clinical history or exam. For left prophylactic mastectomy and right breast prosthesis.

## 2019-06-03 NOTE — Anesthesia Preprocedure Evaluation (Addendum)
Anesthesia Evaluation  Patient identified by MRN, date of birth, ID band Patient awake    Reviewed: Allergy & Precautions, NPO status , Patient's Chart, lab work & pertinent test results  History of Anesthesia Complications Negative for: history of anesthetic complications  Airway Mallampati: I  TM Distance: >3 FB Neck ROM: Full    Dental no notable dental hx.    Pulmonary COPD (mild), Current Smoker,    breath sounds clear to auscultation- rhonchi (-) wheezing      Cardiovascular Exercise Tolerance: Good (-) hypertension(-) CAD, (-) Past MI, (-) Cardiac Stents and (-) CABG  Rhythm:Regular Rate:Normal - Systolic murmurs and - Diastolic murmurs    Neuro/Psych  Headaches, PSYCHIATRIC DISORDERS Depression    GI/Hepatic Neg liver ROS, GERD  ,  Endo/Other  negative endocrine ROSneg diabetes  Renal/GU negative Renal ROS     Musculoskeletal negative musculoskeletal ROS (+)   Abdominal (+) - obese,   Peds  Hematology  (+) Blood dyscrasia, anemia ,   Anesthesia Other Findings Past Medical History: No date: Anemia 11/26/2017: BRCA negative 10/11/2017: Breast cancer ([place])     Comment:  Multifocal, ER positive, PR negative, HER-2 negative. 11/2017: Chronic bronchitis (HCC) No date: COPD (chronic obstructive pulmonary disease) (HCC)     Comment:  MILD PER CXR No date: Depression No date: Family history of cancer No date: GERD (gastroesophageal reflux disease) No date: Headache     Comment:  MIGRAINES No date: Heart murmur     Comment:  ASYMPTOMATIC No date: Personal history of chemotherapy     Comment:  current for right breast ca   Reproductive/Obstetrics                            Anesthesia Physical  Anesthesia Plan  ASA: III  Anesthesia Plan: General   Post-op Pain Management:    Induction: Intravenous  PONV Risk Score and Plan: 1 and Ondansetron, Dexamethasone, Midazolam and  Promethazine  Airway Management Planned: Oral ETT and LMA  Additional Equipment:   Intra-op Plan:   Post-operative Plan: Extubation in OR  Informed Consent: I have reviewed the patients History and Physical, chart, labs and discussed the procedure including the risks, benefits and alternatives for the proposed anesthesia with the patient or authorized representative who has indicated his/her understanding and acceptance.     Dental advisory given  Plan Discussed with: CRNA and Anesthesiologist  Anesthesia Plan Comments:        Anesthesia Quick Evaluation

## 2019-06-05 ENCOUNTER — Encounter: Payer: Self-pay | Admitting: General Surgery

## 2019-06-05 LAB — SURGICAL PATHOLOGY

## 2019-06-06 ENCOUNTER — Ambulatory Visit (INDEPENDENT_AMBULATORY_CARE_PROVIDER_SITE_OTHER): Payer: Managed Care, Other (non HMO)

## 2019-06-06 ENCOUNTER — Other Ambulatory Visit: Payer: Self-pay

## 2019-06-06 DIAGNOSIS — C50811 Malignant neoplasm of overlapping sites of right female breast: Secondary | ICD-10-CM

## 2019-06-06 DIAGNOSIS — Z170 Estrogen receptor positive status [ER+]: Secondary | ICD-10-CM

## 2019-06-06 NOTE — Progress Notes (Signed)
Patient ID: Theresa Chaney, female   DOB: 06-May-1981, 38 y.o.   MRN: [id number] Patient came in today for a wound check, post op left mastectomy and port removal. She reports that she is doing well. Site unwrapped and re wrapped, Drains in place  The wound is clean, with no signs of infection noted. Follow up as scheduled.

## 2019-06-10 ENCOUNTER — Inpatient Hospital Stay: Payer: Managed Care, Other (non HMO)

## 2019-06-10 ENCOUNTER — Ambulatory Visit: Payer: Managed Care, Other (non HMO) | Admitting: Radiation Oncology

## 2019-06-10 ENCOUNTER — Telehealth: Payer: Self-pay | Admitting: Plastic Surgery

## 2019-06-10 ENCOUNTER — Inpatient Hospital Stay: Payer: Managed Care, Other (non HMO) | Admitting: Oncology

## 2019-06-10 NOTE — Telephone Encounter (Signed)

## 2019-06-11 ENCOUNTER — Ambulatory Visit (INDEPENDENT_AMBULATORY_CARE_PROVIDER_SITE_OTHER): Payer: Managed Care, Other (non HMO) | Admitting: Plastic Surgery

## 2019-06-11 ENCOUNTER — Encounter: Payer: Self-pay | Admitting: Plastic Surgery

## 2019-06-11 ENCOUNTER — Other Ambulatory Visit: Payer: Self-pay

## 2019-06-11 VITALS — BP 112/72 | HR 99 | Temp 98.1°F | Ht 67.0 in | Wt 181.6 lb

## 2019-06-11 DIAGNOSIS — Z9011 Acquired absence of right breast and nipple: Secondary | ICD-10-CM

## 2019-06-11 NOTE — Progress Notes (Signed)
   Subjective:    Patient ID: Cleopatra Cedar, female    DOB: 06/02/1981, 38 y.o.   MRN: [id number]  The patient is a 38 year old female here with her daughter for follow-up after undergoing breast surgery.  She had a left mastectomy in [place].  We also did a removal of right breast implant.  She had gotten radiation and was extremely tight.  She states today she is very happy with having the implant out.  She does not want any reconstruction.  Her drain output has been minimal.  There is no sign of from hematoma or seroma.  Her incisions are healing well.  No sign of infection.     Review of Systems  Constitutional: Negative.   HENT: Negative.   Respiratory: Negative.   Cardiovascular: Negative.   Musculoskeletal: Negative.  Negative for gait problem.  Psychiatric/Behavioral: Negative.        Objective:   Physical Exam Vitals signs and nursing note reviewed.  Constitutional:      Appearance: Normal appearance.  HENT:     Head: Normocephalic and atraumatic.  Cardiovascular:     Rate and Rhythm: Normal rate.  Pulmonary:     Effort: Pulmonary effort is normal.  Neurological:     General: No focal deficit present.     Mental Status: She is alert. Mental status is at baseline.  Psychiatric:        Mood and Affect: Mood normal.        Thought Content: Thought content normal.         Assessment & Plan:     ICD-10-CM   1. Acquired absence of right breast and nipple  Z90.11     The drains were removed bilaterally.  Continue with the sports bra or breast binder.  Follow-up in 2 to 3 weeks.

## 2019-06-16 ENCOUNTER — Encounter: Payer: Self-pay | Admitting: General Surgery

## 2019-06-18 ENCOUNTER — Encounter: Payer: Self-pay | Admitting: General Surgery

## 2019-06-18 ENCOUNTER — Other Ambulatory Visit: Payer: Self-pay

## 2019-06-18 ENCOUNTER — Ambulatory Visit (INDEPENDENT_AMBULATORY_CARE_PROVIDER_SITE_OTHER): Payer: Managed Care, Other (non HMO) | Admitting: General Surgery

## 2019-06-18 VITALS — BP 104/61 | HR 96 | Temp 97.9°F | Ht 65.0 in | Wt 184.0 lb

## 2019-06-18 DIAGNOSIS — Z170 Estrogen receptor positive status [ER+]: Secondary | ICD-10-CM

## 2019-06-18 DIAGNOSIS — C50811 Malignant neoplasm of overlapping sites of right female breast: Secondary | ICD-10-CM

## 2019-06-18 NOTE — Patient Instructions (Signed)
Please see your appointment scheduled below.

## 2019-06-18 NOTE — Progress Notes (Signed)
Patient ID: Theresa Chaney, female   DOB: Mar 22, 1981, 38 y.o.   MRN: [id number]  Chief Complaint  Patient presents with  . Routine Post Op    HPI Theresa Chaney is a 38 y.o. female here today for her post op left mastectomy and port removal completed  on 06/03/2019.  The patient reports doing well.  She does think she is accumulated some fluid in the mastectomy flap.  Drain has been removed by Dr. Marla Roe as drainage record had fallen to 5 cc/day.  Past Medical History:  Diagnosis Date  . Anemia   . BRCA negative 11/26/2017  . Breast cancer ([place]) 10/11/2017   Multifocal, ER positive, PR negative, HER-2 negative. ypT3 ypN2a 8.7 cm, 4/15 nodes  . Cardiomyopathy ([place])    a. 10/2017 Echo: EF 60-65%, no rwma, Gr1 DD, nl RV size/fxn; b. 04/2018 Echo: EF 55-60%, no rwma, Nl RV size/fxn; c. 08/2018 Echo: EF 45%, diff HK, ? HK of antsept wall. Gr1 DD. Mild MR. Mild LAE/RAE. Mod dil RV.   [name] Chronic bronchitis ([place]) 11/2017  . COPD (chronic obstructive pulmonary disease) ([place])    MILD PER CXR  . Depression   . Family history of cancer   . GERD (gastroesophageal reflux disease)   . Headache    MIGRAINES  . Heart murmur    ASYMPTOMATIC  . Personal history of chemotherapy    current for right breast ca    Past Surgical History:  Procedure Laterality Date  . AXILLARY LYMPH NODE DISSECTION Right 03/19/2018   Procedure: AXILLARY LYMPH NODE DISSECTION;  Surgeon: Robert Bellow, MD;  Location: [facility];  Service: General;  Laterality: Right;  . BREAST BIOPSY Right 10/11/2017   12:30 posterior coil clip invasive mammary carcinoma  . BREAST BIOPSY Right 10/11/2017   11:30 middle depth ribbon clip DCIS  . BREAST BIOPSY Right 10/11/2017   5:30 anterior depth x shape invasive ductal carcinoma  . BREAST IMPLANT REMOVAL Right 06/03/2019   Procedure: REMOVAL OF RIGHT BREAST IMPLANTS;  Surgeon: Wallace Going, DO;  Location: [facility];  Service: Plastics;  Laterality: Right;  . BREAST  RECONSTRUCTION WITH PLACEMENT OF TISSUE EXPANDER AND FLEX HD (ACELLULAR HYDRATED DERMIS) Right 03/19/2018   Procedure: BREAST RECONSTRUCTION WITH PLACEMENT OF TISSUE EXPANDER AND FLEX HD (ACELLULAR HYDRATED DERMIS);  Surgeon: Wallace Going, DO;  Location: [facility];  Service: Plastics;  Laterality: Right;  . LAPAROSCOPIC BILATERAL SALPINGO OOPHERECTOMY Bilateral 03/19/2018   Procedure: LAPAROSCOPIC BILATERAL SALPINGO OOPHORECTOMY;  Surgeon: Benjaman Kindler, MD;  Location: [facility];  Service: Gynecology;  Laterality: Bilateral;  . MASTECTOMY Right 03/2018  . MASTECTOMY W/ SENTINEL NODE BIOPSY Right 03/19/2018   Procedure: MASTECTOMY WITH SENTINEL LYMPH NODE BIOPSY;  Surgeon: Robert Bellow, MD;  Location: [facility];  Service: General;  Laterality: Right;  . PORT-A-CATH REMOVAL Left 06/03/2019   Procedure: REMOVAL PORT-A-CATH;  Surgeon: Robert Bellow, MD;  Location: [facility];  Service: General;  Laterality: Left;  . PORTACATH PLACEMENT Left 10/24/2017   Procedure: INSERTION PORT-A-CATH;  Surgeon: Robert Bellow, MD;  Location: [facility];  Service: General;  Laterality: Left;  . REMOVAL OF TISSUE EXPANDER AND PLACEMENT OF IMPLANT Right 07/20/2018   Procedure: REMOVAL OF RIGHT BREAST TISSUE EXPANDER AND PLACEMENT OF IMPLANT;  Surgeon: Wallace Going, DO;  Location: [facility];  Service: Plastics;  Laterality: Right;  . SIMPLE MASTECTOMY WITH AXILLARY SENTINEL NODE BIOPSY Left 06/03/2019   Procedure: SIMPLE MASTECTOMY LEFT;  Surgeon: Robert Bellow,  MD;  Location: [facility];  Service: General;  Laterality: Left;    Family History  Problem Relation Age of Onset  . Melanoma Maternal Aunt        other aunts with BCC/SCC/Melanoma  . Diabetes Father   . Hypertension Father   . Hyperlipidemia Father   . Heart attack Father 53       "mild"  . Bladder Cancer Maternal Grandmother   . Cervical cancer Maternal Aunt 64       daughter w/ cervical cancer as well  . Melanoma  Maternal Uncle        other uncles with BCC/SCC/Melanoma    Social History Social History   Tobacco Use  . Smoking status: Current Every Day Smoker    Packs/day: 0.50    Years: 18.00    Pack years: 9.00    Types: Cigarettes    Start date: 06/21/2018  . Smokeless tobacco: Never Used  Substance Use Topics  . Alcohol use: No    Frequency: Never  . Drug use: No    No Known Allergies  Current Outpatient Medications  Medication Sig Dispense Refill  . calcium-vitamin D (OSCAL WITH D) 500-200 MG-UNIT tablet Take 1 tablet by mouth daily.    [name] escitalopram (LEXAPRO) 20 MG tablet Take 20 mg by mouth daily.    [name] esomeprazole (NEXIUM) 40 MG capsule Take 40 mg by mouth daily at 12 noon.    [name] HYDROcodone-acetaminophen (NORCO/VICODIN) 5-325 MG tablet Take 1 tablet by mouth every 4 (four) hours as needed for moderate pain. 12 tablet 0  . ibuprofen (ADVIL) 800 MG tablet Take 800 mg by mouth every 8 (eight) hours as needed.    . Investigational letrozole/placebo tablet NSABP B-42 Take 1 tablet by mouth daily.    [name] loratadine (CLARITIN) 10 MG tablet Take 10 mg by mouth daily.    [name] LORazepam (ATIVAN) 1 MG tablet Take 1 mg by mouth 3 (three) times daily.    . nortriptyline (PAMELOR) 10 MG capsule Take 10 mg by mouth 2 (two) times daily.    . pregabalin (LYRICA) 300 MG capsule Take 1 capsule (300 mg total) by mouth daily. 30 capsule 3  . pyridOXINE (VITAMIN B-6) 100 MG tablet Take 100 mg by mouth daily.    . vitamin C (ASCORBIC ACID) 500 MG tablet Take 500 mg by mouth daily.     No current facility-administered medications for this visit.    Facility-Administered Medications Ordered in Other Visits  Medication Dose Route Frequency Provider Last Rate Last Dose  . heparin lock flush 100 unit/mL  500 Units Intravenous Once Earlie Server, MD      . sodium chloride flush (NS) 0.9 % injection 10 mL  10 mL Intravenous PRN Earlie Server, MD   10 mL at 11/19/18 1249    Review of Systems Review of Systems   Constitutional: Negative.   Respiratory: Negative.   Cardiovascular: Negative.     Blood pressure 104/61, pulse 96, temperature 97.9 F (36.6 C), temperature source Skin, height _0  (1.651 m), weight 184 lb (83.5 kg), SpO2 98 %.  Physical Exam Physical Exam  Data Reviewed Aspiration of 90 cc after chloraprep and 1 cc of 1% Xylocaine.  Tolerated well.   Assessment Small seroma post mastectomy  Plan Repeat exam in one week.   HPI, Physical Exam, Assessment and Plan have been scribed under the direction and in the presence of Robert Bellow, MD. Jonnie Finner, CMA  I have completed the  exam and reviewed the above documentation for accuracy and completeness.  I agree with the above.  Haematologist has been used and any errors in dictation or transcription are unintentional.  Hervey Ard, M.D., F.A.C.S.    Forest Gleason Neilson Oehlert 06/19/2019, 7:24 AM

## 2019-06-25 ENCOUNTER — Ambulatory Visit (INDEPENDENT_AMBULATORY_CARE_PROVIDER_SITE_OTHER): Payer: Managed Care, Other (non HMO) | Admitting: General Surgery

## 2019-06-25 ENCOUNTER — Other Ambulatory Visit: Payer: Self-pay

## 2019-06-25 ENCOUNTER — Encounter: Payer: Self-pay | Admitting: General Surgery

## 2019-06-25 VITALS — BP 124/70 | HR 78 | Temp 97.8°F | Ht 67.0 in | Wt 181.0 lb

## 2019-06-25 DIAGNOSIS — Z170 Estrogen receptor positive status [ER+]: Secondary | ICD-10-CM

## 2019-06-25 DIAGNOSIS — C50811 Malignant neoplasm of overlapping sites of right female breast: Secondary | ICD-10-CM

## 2019-06-25 NOTE — Progress Notes (Signed)
Patient ID: Theresa Chaney, female   DOB: 10/05/1981, 38 y.o.   MRN: [id number]  Chief Complaint  Patient presents with  . Routine Post Op    left mastectomy     HPI [name] is a 38 y.o. female here today for her post op left mastectomy and port removal completed  on 06/03/2019.  The patient reports doing well.   HPI  Past Medical History:  Diagnosis Date  . Anemia   . BRCA negative 11/26/2017  . Breast cancer ([place]) 10/11/2017   Multifocal, ER positive, PR negative, HER-2 negative. ypT3 ypN2a 8.7 cm, 4/15 nodes  . Cardiomyopathy ([place])    a. 10/2017 Echo: EF 60-65%, no rwma, Gr1 DD, nl RV size/fxn; b. 04/2018 Echo: EF 55-60%, no rwma, Nl RV size/fxn; c. 08/2018 Echo: EF 45%, diff HK, ? HK of antsept wall. Gr1 DD. Mild MR. Mild LAE/RAE. Mod dil RV.   [name] Chronic bronchitis ([place]) 11/2017  . COPD (chronic obstructive pulmonary disease) ([place])    MILD PER CXR  . Depression   . Family history of cancer   . GERD (gastroesophageal reflux disease)   . Headache    MIGRAINES  . Heart murmur    ASYMPTOMATIC  . Personal history of chemotherapy    current for right breast ca    Past Surgical History:  Procedure Laterality Date  . AXILLARY LYMPH NODE DISSECTION Right 03/19/2018   Procedure: AXILLARY LYMPH NODE DISSECTION;  Surgeon: Robert Bellow, MD;  Location: [facility];  Service: General;  Laterality: Right;  . BREAST BIOPSY Right 10/11/2017   12:30 posterior coil clip invasive mammary carcinoma  . BREAST BIOPSY Right 10/11/2017   11:30 middle depth ribbon clip DCIS  . BREAST BIOPSY Right 10/11/2017   5:30 anterior depth x shape invasive ductal carcinoma  . BREAST IMPLANT REMOVAL Right 06/03/2019   Procedure: REMOVAL OF RIGHT BREAST IMPLANTS;  Surgeon: Wallace Going, DO;  Location: [facility];  Service: Plastics;  Laterality: Right;  . BREAST RECONSTRUCTION WITH PLACEMENT OF TISSUE EXPANDER AND FLEX HD (ACELLULAR HYDRATED DERMIS) Right 03/19/2018   Procedure: BREAST  RECONSTRUCTION WITH PLACEMENT OF TISSUE EXPANDER AND FLEX HD (ACELLULAR HYDRATED DERMIS);  Surgeon: Wallace Going, DO;  Location: [facility];  Service: Plastics;  Laterality: Right;  . LAPAROSCOPIC BILATERAL SALPINGO OOPHERECTOMY Bilateral 03/19/2018   Procedure: LAPAROSCOPIC BILATERAL SALPINGO OOPHORECTOMY;  Surgeon: Benjaman Kindler, MD;  Location: [facility];  Service: Gynecology;  Laterality: Bilateral;  . MASTECTOMY Right 03/2018  . MASTECTOMY W/ SENTINEL NODE BIOPSY Right 03/19/2018   Procedure: MASTECTOMY WITH SENTINEL LYMPH NODE BIOPSY;  Surgeon: Robert Bellow, MD;  Location: [facility];  Service: General;  Laterality: Right;  . PORT-A-CATH REMOVAL Left 06/03/2019   Procedure: REMOVAL PORT-A-CATH;  Surgeon: Robert Bellow, MD;  Location: [facility];  Service: General;  Laterality: Left;  . PORTACATH PLACEMENT Left 10/24/2017   Procedure: INSERTION PORT-A-CATH;  Surgeon: Robert Bellow, MD;  Location: [facility];  Service: General;  Laterality: Left;  . REMOVAL OF TISSUE EXPANDER AND PLACEMENT OF IMPLANT Right 07/20/2018   Procedure: REMOVAL OF RIGHT BREAST TISSUE EXPANDER AND PLACEMENT OF IMPLANT;  Surgeon: Wallace Going, DO;  Location: [facility];  Service: Plastics;  Laterality: Right;  . SIMPLE MASTECTOMY WITH AXILLARY SENTINEL NODE BIOPSY Left 06/03/2019   Procedure: SIMPLE MASTECTOMY LEFT;  Surgeon: Robert Bellow, MD;  Location: [facility];  Service: General;  Laterality: Left;    Family History  Problem Relation Age  of Onset  . Melanoma Maternal Aunt        other aunts with BCC/SCC/Melanoma  . Diabetes Father   . Hypertension Father   . Hyperlipidemia Father   . Heart attack Father 75       "mild"  . Bladder Cancer Maternal Grandmother   . Cervical cancer Maternal Aunt 64       daughter w/ cervical cancer as well  . Melanoma Maternal Uncle        other uncles with BCC/SCC/Melanoma    Social History Social History   Tobacco Use  . Smoking  status: Current Every Day Smoker    Packs/day: 0.50    Years: 18.00    Pack years: 9.00    Types: Cigarettes    Start date: 06/21/2018  . Smokeless tobacco: Never Used  Substance Use Topics  . Alcohol use: No    Frequency: Never  . Drug use: No    No Known Allergies  Current Outpatient Medications  Medication Sig Dispense Refill  . calcium-vitamin D (OSCAL WITH D) 500-200 MG-UNIT tablet Take 1 tablet by mouth daily.    [name] escitalopram (LEXAPRO) 20 MG tablet Take 20 mg by mouth daily.    [name] esomeprazole (NEXIUM) 40 MG capsule Take 40 mg by mouth daily at 12 noon.    [name] HYDROcodone-acetaminophen (NORCO/VICODIN) 5-325 MG tablet Take 1 tablet by mouth every 4 (four) hours as needed for moderate pain. 12 tablet 0  . ibuprofen (ADVIL) 800 MG tablet Take 800 mg by mouth every 8 (eight) hours as needed.    . Investigational letrozole/placebo tablet NSABP B-42 Take 1 tablet by mouth daily.    [name] loratadine (CLARITIN) 10 MG tablet Take 10 mg by mouth daily.    [name] LORazepam (ATIVAN) 1 MG tablet Take 1 mg by mouth 3 (three) times daily.    . nortriptyline (PAMELOR) 10 MG capsule Take 10 mg by mouth 2 (two) times daily.    . pregabalin (LYRICA) 300 MG capsule Take 1 capsule (300 mg total) by mouth daily. 30 capsule 3  . pyridOXINE (VITAMIN B-6) 100 MG tablet Take 100 mg by mouth daily.    . vitamin C (ASCORBIC ACID) 500 MG tablet Take 500 mg by mouth daily.     No current facility-administered medications for this visit.    Facility-Administered Medications Ordered in Other Visits  Medication Dose Route Frequency Provider Last Rate Last Dose  . heparin lock flush 100 unit/mL  500 Units Intravenous Once Earlie Server, MD      . sodium chloride flush (NS) 0.9 % injection 10 mL  10 mL Intravenous PRN Earlie Server, MD   10 mL at 11/19/18 1249    Review of Systems Review of Systems  Constitutional: Negative.   Respiratory: Negative.   Cardiovascular: Negative.     Blood pressure 124/70, pulse 78,  temperature 97.8 F (36.6 C), temperature source Skin, height 5' 7" (1.702 m), weight 181 lb (82.1 kg), SpO2 99 %.  Physical Exam Physical Exam Exam conducted with a chaperone present.  Chest:          Assessment Doing well post left prophylactic mastectomy.  Plan   Return in two weeks.The patient is aware to call back for any questions or concerns.   HPI, Physical Exam, Assessment and Plan have been scribed under the direction and in the presence of Hervey Ard, MD.  Gaspar Cola, [place] Karla Pavone 06/26/2019, 6:05 PM

## 2019-06-25 NOTE — Patient Instructions (Addendum)
Return in three weeks. The patient is aware to call back for any questions or concerns.  

## 2019-07-05 ENCOUNTER — Other Ambulatory Visit: Payer: Self-pay

## 2019-07-05 DIAGNOSIS — C50811 Malignant neoplasm of overlapping sites of right female breast: Secondary | ICD-10-CM

## 2019-07-05 DIAGNOSIS — Z170 Estrogen receptor positive status [ER+]: Secondary | ICD-10-CM

## 2019-07-08 ENCOUNTER — Encounter: Payer: Self-pay | Admitting: Oncology

## 2019-07-08 ENCOUNTER — Inpatient Hospital Stay (HOSPITAL_BASED_OUTPATIENT_CLINIC_OR_DEPARTMENT_OTHER): Payer: Managed Care, Other (non HMO) | Admitting: Oncology

## 2019-07-08 ENCOUNTER — Inpatient Hospital Stay: Payer: Managed Care, Other (non HMO) | Attending: Oncology

## 2019-07-08 ENCOUNTER — Other Ambulatory Visit: Payer: Self-pay

## 2019-07-08 ENCOUNTER — Inpatient Hospital Stay: Payer: Managed Care, Other (non HMO)

## 2019-07-08 VITALS — BP 105/70 | HR 82 | Temp 98.1°F | Resp 18 | Wt 179.5 lb

## 2019-07-08 DIAGNOSIS — F329 Major depressive disorder, single episode, unspecified: Secondary | ICD-10-CM

## 2019-07-08 DIAGNOSIS — Z923 Personal history of irradiation: Secondary | ICD-10-CM

## 2019-07-08 DIAGNOSIS — R911 Solitary pulmonary nodule: Secondary | ICD-10-CM

## 2019-07-08 DIAGNOSIS — Z7983 Long term (current) use of bisphosphonates: Secondary | ICD-10-CM

## 2019-07-08 DIAGNOSIS — Z170 Estrogen receptor positive status [ER+]: Secondary | ICD-10-CM

## 2019-07-08 DIAGNOSIS — Z9221 Personal history of antineoplastic chemotherapy: Secondary | ICD-10-CM

## 2019-07-08 DIAGNOSIS — N951 Menopausal and female climacteric states: Secondary | ICD-10-CM

## 2019-07-08 DIAGNOSIS — Z79811 Long term (current) use of aromatase inhibitors: Secondary | ICD-10-CM

## 2019-07-08 DIAGNOSIS — C50411 Malignant neoplasm of upper-outer quadrant of right female breast: Secondary | ICD-10-CM

## 2019-07-08 DIAGNOSIS — R918 Other nonspecific abnormal finding of lung field: Secondary | ICD-10-CM

## 2019-07-08 DIAGNOSIS — G620 Drug-induced polyneuropathy: Secondary | ICD-10-CM

## 2019-07-08 DIAGNOSIS — Z90722 Acquired absence of ovaries, bilateral: Secondary | ICD-10-CM

## 2019-07-08 DIAGNOSIS — C50811 Malignant neoplasm of overlapping sites of right female breast: Secondary | ICD-10-CM

## 2019-07-08 DIAGNOSIS — M7989 Other specified soft tissue disorders: Secondary | ICD-10-CM

## 2019-07-08 DIAGNOSIS — T451X5A Adverse effect of antineoplastic and immunosuppressive drugs, initial encounter: Secondary | ICD-10-CM

## 2019-07-08 DIAGNOSIS — Z8049 Family history of malignant neoplasm of other genital organs: Secondary | ICD-10-CM

## 2019-07-08 DIAGNOSIS — Z95828 Presence of other vascular implants and grafts: Secondary | ICD-10-CM

## 2019-07-08 DIAGNOSIS — Z9013 Acquired absence of bilateral breasts and nipples: Secondary | ICD-10-CM

## 2019-07-08 DIAGNOSIS — Z8052 Family history of malignant neoplasm of bladder: Secondary | ICD-10-CM

## 2019-07-08 DIAGNOSIS — Z808 Family history of malignant neoplasm of other organs or systems: Secondary | ICD-10-CM

## 2019-07-08 LAB — CBC WITH DIFFERENTIAL/PLATELET
Abs Immature Granulocytes: 0.02 10*3/uL (ref 0.00–0.07)
Basophils Absolute: 0 10*3/uL (ref 0.0–0.1)
Basophils Relative: 0 %
Eosinophils Absolute: 0.2 10*3/uL (ref 0.0–0.5)
Eosinophils Relative: 2 %
HCT: 41.6 % (ref 36.0–46.0)
Hemoglobin: 13.6 g/dL (ref 12.0–15.0)
Immature Granulocytes: 0 %
Lymphocytes Relative: 32 %
Lymphs Abs: 2.9 10*3/uL (ref 0.7–4.0)
MCH: 28.8 pg (ref 26.0–34.0)
MCHC: 32.7 g/dL (ref 30.0–36.0)
MCV: 88.1 fL (ref 80.0–100.0)
Monocytes Absolute: 0.8 10*3/uL (ref 0.1–1.0)
Monocytes Relative: 8 %
Neutro Abs: 5.2 10*3/uL (ref 1.7–7.7)
Neutrophils Relative %: 58 %
Platelets: 280 10*3/uL (ref 150–400)
RBC: 4.72 MIL/uL (ref 3.87–5.11)
RDW: 13 % (ref 11.5–15.5)
WBC: 9.1 10*3/uL (ref 4.0–10.5)
nRBC: 0 % (ref 0.0–0.2)

## 2019-07-08 LAB — COMPREHENSIVE METABOLIC PANEL
ALT: 14 U/L (ref 0–44)
AST: 19 U/L (ref 15–41)
Albumin: 4.6 g/dL (ref 3.5–5.0)
Alkaline Phosphatase: 110 U/L (ref 38–126)
Anion gap: 10 (ref 5–15)
BUN: 21 mg/dL — ABNORMAL HIGH (ref 6–20)
CO2: 25 mmol/L (ref 22–32)
Calcium: 9.2 mg/dL (ref 8.9–10.3)
Chloride: 105 mmol/L (ref 98–111)
Creatinine, Ser: 0.71 mg/dL (ref 0.44–1.00)
GFR calc Af Amer: >60 mL/min (ref >60)
GFR calc non Af Amer: >60 mL/min (ref >60)
Glucose, Bld: 114 mg/dL — ABNORMAL HIGH (ref 70–99)
Potassium: 3.5 mmol/L (ref 3.5–5.1)
Sodium: 140 mmol/L (ref 135–145)
Total Bilirubin: 0.6 mg/dL (ref 0.3–1.2)
Total Protein: 7.5 g/dL (ref 6.5–8.1)

## 2019-07-08 MED ORDER — ZOLEDRONIC ACID 4 MG/5ML IV CONC
4.0000 mg | Freq: Once | INTRAVENOUS | Status: DC
  Filled 2019-07-08: qty 5

## 2019-07-08 MED ORDER — SODIUM CHLORIDE 0.9% FLUSH
10.0000 mL | Freq: Once | INTRAVENOUS | Status: AC
  Filled 2019-07-08: qty 10

## 2019-07-08 MED ORDER — ZOLEDRONIC ACID 4 MG/100ML IV SOLN
4.0000 mg | Freq: Once | INTRAVENOUS | Status: AC
  Administered 2019-07-08: 4 mg via INTRAVENOUS
  Filled 2019-07-08: qty 100

## 2019-07-08 MED ORDER — SODIUM CHLORIDE 0.9 % IV SOLN
INTRAVENOUS | Status: DC
  Administered 2019-07-08: 09:00:00 via INTRAVENOUS
  Filled 2019-07-08: qty 250

## 2019-07-08 NOTE — Progress Notes (Signed)
Patient reports having good and bad days with her neuropathy.  She is now followed by neurology regarding the neuropathy.  She had double mastectomy on 06/03/19.

## 2019-07-08 NOTE — Progress Notes (Addendum)
Hematology/Oncology Follow Up Note [facility] [phone] [phone]  Patient Care Team: Marguerita Merles, MD as PCP - General (Family Medicine) End, Harrell Gave, MD as PCP - Cardiology (Cardiology) Rico Junker, RN as Oncology Nurse Navigator Earlie Server, MD as Consulting Physician (Oncology) Bary Castilla, Forest Gleason, MD (General Surgery) Noreene Filbert, MD as Referring Physician (Radiation Oncology)   Name of the patient: Theresa Chaney  [id number]  Apr 22, 1981   Date of visit: 07/08/19 REASON FOR VISIT Follow up for Assessment prior to chemotherapy treatment of breast cancer  Oncology History 10/11/2017 Diagnosed with [id number]  Neoadjuvant dose dense AC and 1 cycle of Taxol.  Interim image showed no treatment response. 03/19/2018 right mastectomy and right axillary dissection, bilateral salpingo-oophorectomy. ypT3 ypN2 #Negative genetic testing #06/11/2018 Finish adjuvant weekly Taxol x11 Grade 2 neuropathy  #right immediate breast reconstruction with placement of tissue expanders.   Chemotherapy-induced neuropathy, patient  Lyrica 300 mg daily.   She was taking Cymbalta which did not improve her symptoms.  Also reports that the Cymbalta makes patient feel nauseous. So she stopped the Cymbalta and ask for other medications to help with her depression.  She used to be on Lexapro which works fine for her.  Patient was restarted on Prozac 20 mg daily.   Cancer TREATMENT Neoadjuvant ddAC +one dose of Taxol, due to lack of response, surgery was offered. Case was discussed on breast tumor board. 03/19/2018 S/p right mastectomy and right axillary dissection, immediate breast reconstruction with placement of expanders. Also had elective bilateral salpingo-oophorectomy..  # s/p 11 cycles Taxol adjuvantly. Tolerated well.  # She has obtained dental clearance for starting Zometa.  S/p Zometa on 6/3/ 2019 # s/p adjuvant radiation, finished 10/10/2018  #06/03/2019 underwent elective left prophylactic mastectomy and sentinel lymph node biopsy of left axilla.  INTERVAL HISTORY 38yo female with above oncology history reviewed by me presents for follow-up of management of breast cancer.  Patient has completed right breast reconstruction on 07/20/2018 and s/p adjuvant radiation, finished 10/10/2018.  ##06/03/2019 underwent elective left prophylactic mastectomy and sentinel lymph node biopsy of left axilla. Pathology negative for malignancy. Status post Mediport removal on 06/03/2019. She also underwent right implant removal on 06/03/2019.  Patient followed up with Dr.Byrnett for post op evaluation.  Note reviewed.  #Chemotherapy-induced neuropathy, currently on Lyrica 300 mg daily, reports that she has had good days and bad days for the neuropathy.  Follows up with neurology regarding the neuropathy  #Takes letrozole 2.5 mg daily.  Ongoing manageable hot flash. Continues to have chronic joint aches.. Bilateral upper extremity swelling, slightly better.  She follows up with lymphedema clinic. Appetite has been fair.   Review of Systems  Constitutional: Negative for chills, fever, malaise/fatigue and weight loss.  HENT: Negative for sore throat.   Eyes: Negative for redness.  Respiratory: Negative for cough, shortness of breath and wheezing.   Cardiovascular: Negative for chest pain, palpitations and leg swelling.  Gastrointestinal: Negative for abdominal pain, blood in stool, nausea and vomiting.  Genitourinary: Negative for dysuria.  Musculoskeletal: Positive for joint pain. Negative for myalgias.  Skin: Negative for rash.  Neurological: Negative for dizziness, tingling and tremors.  Endo/Heme/Allergies: Does not bruise/bleed easily.  Psychiatric/Behavioral: Negative for hallucinations.    No Known Allergies  Patient Active Problem List   Diagnosis Date Noted  . Fracture of neck of metacarpal bone 05/14/2019  . Chronic fatigue  04/09/2019  . Polyarthralgia 04/09/2019  . Status post right breast reconstruction 02/26/2019  .  Status post right mastectomy 02/26/2019  . Mastalgia 02/15/2019  . Shortness of breath 08/23/2018  . Nonischemic cardiomyopathy ([place]) 08/23/2018  . Preprocedural cardiovascular examination 08/23/2018  . Tachycardia 08/23/2018  . Palpitations 08/23/2018  . Estrogen receptor positive status (ER+) 04/04/2018  . Acquired absence of right breast and nipple 04/03/2018  . Breast cancer of upper-outer quadrant of right female breast ([place]) 03/19/2018  . Family history of cancer   . Malignant neoplasm of overlapping sites of right breast in female, estrogen receptor positive ([place]) 10/19/2017  . Gastroesophageal reflux disease without esophagitis 02/24/2017  . Generalized anxiety disorder 10/03/2014  . Headache 10/03/2014     Past Medical History:  Diagnosis Date  . Anemia   . BRCA negative 11/26/2017  . Breast cancer ([place]) 10/11/2017   Multifocal, ER positive, PR negative, HER-2 negative. ypT3 ypN2a 8.7 cm, 4/15 nodes  . Cardiomyopathy ([place])    a. 10/2017 Echo: EF 60-65%, no rwma, Gr1 DD, nl RV size/fxn; b. 04/2018 Echo: EF 55-60%, no rwma, Nl RV size/fxn; c. 08/2018 Echo: EF 45%, diff HK, ? HK of antsept wall. Gr1 DD. Mild MR. Mild LAE/RAE. Mod dil RV.   [name] Chronic bronchitis ([place]) 11/2017  . COPD (chronic obstructive pulmonary disease) ([place])    MILD PER CXR  . Depression   . Family history of cancer   . GERD (gastroesophageal reflux disease)   . Headache    MIGRAINES  . Heart murmur    ASYMPTOMATIC  . Personal history of chemotherapy    current for right breast ca     Past Surgical History:  Procedure Laterality Date  . AXILLARY LYMPH NODE DISSECTION Right 03/19/2018   Procedure: AXILLARY LYMPH NODE DISSECTION;  Surgeon: Robert Bellow, MD;  Location: [facility];  Service: General;  Laterality: Right;  . BREAST BIOPSY Right 10/11/2017   12:30 posterior coil clip invasive mammary  carcinoma  . BREAST BIOPSY Right 10/11/2017   11:30 middle depth ribbon clip DCIS  . BREAST BIOPSY Right 10/11/2017   5:30 anterior depth x shape invasive ductal carcinoma  . BREAST IMPLANT REMOVAL Right 06/03/2019   Procedure: REMOVAL OF RIGHT BREAST IMPLANTS;  Surgeon: Wallace Going, DO;  Location: [facility];  Service: Plastics;  Laterality: Right;  . BREAST RECONSTRUCTION WITH PLACEMENT OF TISSUE EXPANDER AND FLEX HD (ACELLULAR HYDRATED DERMIS) Right 03/19/2018   Procedure: BREAST RECONSTRUCTION WITH PLACEMENT OF TISSUE EXPANDER AND FLEX HD (ACELLULAR HYDRATED DERMIS);  Surgeon: Wallace Going, DO;  Location: [facility];  Service: Plastics;  Laterality: Right;  . LAPAROSCOPIC BILATERAL SALPINGO OOPHERECTOMY Bilateral 03/19/2018   Procedure: LAPAROSCOPIC BILATERAL SALPINGO OOPHORECTOMY;  Surgeon: Benjaman Kindler, MD;  Location: [facility];  Service: Gynecology;  Laterality: Bilateral;  . MASTECTOMY Right 03/2018  . MASTECTOMY W/ SENTINEL NODE BIOPSY Right 03/19/2018   Procedure: MASTECTOMY WITH SENTINEL LYMPH NODE BIOPSY;  Surgeon: Robert Bellow, MD;  Location: [facility];  Service: General;  Laterality: Right;  . PORT-A-CATH REMOVAL Left 06/03/2019   Procedure: REMOVAL PORT-A-CATH;  Surgeon: Robert Bellow, MD;  Location: [facility];  Service: General;  Laterality: Left;  . PORTACATH PLACEMENT Left 10/24/2017   Procedure: INSERTION PORT-A-CATH;  Surgeon: Robert Bellow, MD;  Location: [facility];  Service: General;  Laterality: Left;  . REMOVAL OF TISSUE EXPANDER AND PLACEMENT OF IMPLANT Right 07/20/2018   Procedure: REMOVAL OF RIGHT BREAST TISSUE EXPANDER AND PLACEMENT OF IMPLANT;  Surgeon: Wallace Going, DO;  Location: [place];  Service: Plastics;  Laterality: Right;  .  SIMPLE MASTECTOMY WITH AXILLARY SENTINEL NODE BIOPSY Left 06/03/2019   Procedure: SIMPLE MASTECTOMY LEFT;  Surgeon: Robert Bellow, MD;  Location: [facility];  Service: General;  Laterality:  Left;    Social History   Socioeconomic History  . Marital status: Married    Spouse name: Not on file  . Number of children: Not on file  . Years of education: Not on file  . Highest education level: Not on file  Occupational History  . Occupation: Occupational psychologist    Comment: [employer]  . Financial resource strain: Not on file  . Food insecurity    Worry: Not on file    Inability: Not on file  . Transportation needs    Medical: Not on file    Non-medical: Not on file  Tobacco Use  . Smoking status: Current Every Day Smoker    Packs/day: 0.50    Years: 18.00    Pack years: 9.00    Types: Cigarettes    Start date: 06/21/2018  . Smokeless tobacco: Never Used  Substance and Sexual Activity  . Alcohol use: No    Frequency: Never  . Drug use: No  . Sexual activity: Yes    Birth control/protection: Injection  Lifestyle  . Physical activity    Days per week: Not on file    Minutes per session: Not on file  . Stress: Not on file  Relationships  . Social Herbalist on phone: Not on file    Gets together: Not on file    Attends religious service: Not on file    Active member of club or organization: Not on file    Attends meetings of clubs or organizations: Not on file    Relationship status: Not on file  . Intimate partner violence    Fear of current or ex partner: Not on file    Emotionally abused: Not on file    Physically abused: Not on file    Forced sexual activity: Not on file  Other Topics Concern  . Not on file  Social History Narrative  . Not on file     Family History  Problem Relation Age of Onset  . Melanoma Maternal Aunt        other aunts with BCC/SCC/Melanoma  . Diabetes Father   . Hypertension Father   . Hyperlipidemia Father   . Heart attack Father 6       "mild"  . Bladder Cancer Maternal Grandmother   . Cervical cancer Maternal Aunt 64       daughter w/ cervical cancer as well  .  Melanoma Maternal Uncle        other uncles with BCC/SCC/Melanoma     Current Outpatient Medications:  .  calcium-vitamin D (OSCAL WITH D) 500-200 MG-UNIT tablet, Take 1 tablet by mouth daily., Disp: , Rfl:  .  escitalopram (LEXAPRO) 20 MG tablet, Take 20 mg by mouth daily., Disp: , Rfl:  .  esomeprazole (NEXIUM) 40 MG capsule, Take 40 mg by mouth daily at 12 noon., Disp: , Rfl:  .  HYDROcodone-acetaminophen (NORCO/VICODIN) 5-325 MG tablet, Take 1 tablet by mouth every 4 (four) hours as needed for moderate pain., Disp: 12 tablet, Rfl: 0 .  ibuprofen (ADVIL) 800 MG tablet, Take 800 mg by mouth every 8 (eight) hours as needed., Disp: , Rfl:  .  Investigational letrozole/placebo tablet NSABP B-42, Take 1 tablet by mouth daily., Disp: , Rfl:  .  loratadine (CLARITIN) 10 MG tablet, Take 10 mg by mouth daily., Disp: , Rfl:  .  LORazepam (ATIVAN) 1 MG tablet, Take 1 mg by mouth 3 (three) times daily., Disp: , Rfl:  .  meloxicam (MOBIC) 7.5 MG tablet, Take 7.5 mg by mouth 2 (two) times a day., Disp: , Rfl:  .  nortriptyline (PAMELOR) 10 MG capsule, Take 10 mg by mouth 2 (two) times daily., Disp: , Rfl:  .  pregabalin (LYRICA) 300 MG capsule, Take 1 capsule (300 mg total) by mouth daily., Disp: 30 capsule, Rfl: 3 .  pyridOXINE (VITAMIN B-6) 100 MG tablet, Take 100 mg by mouth daily., Disp: , Rfl:  .  vitamin C (ASCORBIC ACID) 500 MG tablet, Take 500 mg by mouth daily., Disp: , Rfl:  No current facility-administered medications for this visit.   Facility-Administered Medications Ordered in Other Visits:  .  heparin lock flush 100 unit/mL, 500 Units, Intravenous, Once, Earlie Server, MD .  sodium chloride flush (NS) 0.9 % injection 10 mL, 10 mL, Intravenous, PRN, Earlie Server, MD, 10 mL at 11/19/18 1249 .  sodium chloride flush (NS) 0.9 % injection 10 mL, 10 mL, Intravenous, Once, Earlie Server, MD   Physical exam:  Vitals:   07/08/19 0848  BP: 105/70  Pulse: 82  Resp: 18  Temp: 98.1 F (36.7 C)  Weight:  179 lb 8 oz (81.4 kg)  ECOG 1 Physical Exam  Constitutional: She is oriented to person, place, and time. No distress.  HENT:  Head: Normocephalic and atraumatic.  Nose: Nose normal.  Mouth/Throat: Oropharynx is clear and moist. No oropharyngeal exudate.  Eyes: Pupils are equal, round, and reactive to light. Conjunctivae and EOM are normal. Left eye exhibits no discharge. No scleral icterus.  Neck: Normal range of motion. Neck supple. No JVD present.  Cardiovascular: Normal rate, regular rhythm and normal heart sounds.  No murmur heard. Pulmonary/Chest: Effort normal and breath sounds normal. No respiratory distress. She has no wheezes. She has no rales. She exhibits no tenderness.  Abdominal: Soft. Bowel sounds are normal. She exhibits no distension and no mass. There is no abdominal tenderness. There is no rebound.  Musculoskeletal: Normal range of motion.        General: No edema.     Comments: Bilateral hand swelling.   Lymphadenopathy:    She has no cervical adenopathy.  Neurological: She is alert and oriented to person, place, and time. No cranial nerve deficit. She exhibits normal muscle tone. Coordination normal.  Skin: Skin is warm and dry. No rash noted. She is not diaphoretic. No erythema.  Psychiatric: Affect normal.        CMP Latest Ref Rng & Units 07/08/2019  Glucose 70 - 99 mg/dL 114(H)  BUN 6 - 20 mg/dL 21(H)  Creatinine 0.44 - 1.00 mg/dL 0.71  Sodium 135 - 145 mmol/L 140  Potassium 3.5 - 5.1 mmol/L 3.5  Chloride 98 - 111 mmol/L 105  CO2 22 - 32 mmol/L 25  Calcium 8.9 - 10.3 mg/dL 9.2  Total Protein 6.5 - 8.1 g/dL 7.5  Total Bilirubin 0.3 - 1.2 mg/dL 0.6  Alkaline Phos 38 - 126 U/L 110  AST 15 - 41 U/L 19  ALT 0 - 44 U/L 14   CBC Latest Ref Rng & Units 07/08/2019  WBC 4.0 - 10.5 K/uL 9.1  Hemoglobin 12.0 - 15.0 g/dL 13.6  Hematocrit 36.0 - 46.0 % 41.6  Platelets 150 - 400 K/uL 280   RADIOGRAPHIC STUDIES: I have personally  reviewed the radiological  images as listed and agreed with the findings in the report. 08/23/2018 DEXA The BMD measured at Femur Neck Right is 0.900 g/cm2 with a T-score of -1.0. This patient is considered normal according to [facility]) criteria. Site Region Measured Measured WHO Young Adult BMD Date       Age      Classification T-score AP Spine L1-L4 08/23/2018 35.5 Normal -0.8 1.091 g/cm2 DualFemur Neck Right 08/23/2018 35.5 Normal -1.0 0.900 g/cm2  08/15/2018 2D echo Study Conclusions - Left ventricle: The cavity size was mildly dilated. Systolic   function was mildly reduced. The estimated ejection fraction was   45%. Diffuse hypokinesis. Hypokinesis of the anteroseptal   myocardium. Doppler parameters are consistent with abnormal left   ventricular relaxation (grade 1 diastolic dysfunction). - Aortic valve: Valve area (VTI): 1.88 cm^2. Valve area (Vmax): 1.8   cm^2. Valve area (Vmean): 1.74 cm^2. - Mitral valve: There was mild regurgitation. Valve area by   continuity equation (using LVOT flow): 2.22 cm^2. - Left atrium: The atrium was mildly dilated. - Right ventricle: The cavity size was moderately dilated. - Right atrium: The atrium was mildly dilated.  RADIOGRAPHIC STUDIES: I have personally reviewed the radiological images as listed and agreed with the findings in the report. 10/30/2018 Bone scan whole body Non scintigraphic evidence of osseous metastatic disease.  Assessment and plan- Patient is a 38 y.o. female presents for evaluation of newly diagnosed multicentric right breast cancer.  Cancer Staging Breast cancer of upper-outer quadrant of right female breast [facility]) Staging form: Breast, AJCC 8th Edition - Clinical: G2, ER+, PR+, HER2- - Signed by Earlie Server, MD on 04/05/2018 - Pathologic stage from 04/04/2018: No Stage Recommended (ypT3, pN2, cM0, G3, ER+, PR-, HER2-) - Signed by Earlie Server, MD on 04/04/2018    1. Malignant neoplasm of overlapping sites of right breast in  female, estrogen receptor positive ([place])   2. Neuropathy due to chemotherapeutic drug ([place])   3. Aromatase inhibitor use   4. Vasomotor symptoms due to menopause   5. Lung nodule   6. Bilateral hand swelling    # TNM Stage IIIA right breast cancer, ER/ PR positive, HER-2 negative status post bilateral oophorectomy, Right mastectomy and right axillary lymph node dissection, status post implant, and implant removal.  Status post left mastectomy and axillary sentinel lymph node biopsy. Clinically doing well. Now she has had a bilateral mastectomy.,  Annual mammogram surveillance is no longer She has surgical ovarian suppression. Currently on letrozole. Has manageable vasovagal symptoms. Continue letrozole.  #Adjuvant bisphosphonate:  Zometa on 12/07/2018.  Due in June 2020, which was delayed due to surgery. Labs are reviewed and discussed with patient today. Counts acceptable to proceed with Zometa on 07/08/2019.  Recommend patient continue  #Chemotherapy-induced neuropathy, continue Lyrica.  Symptoms are stable.  Follow-up with neurology. #Depression.  Continue Lexapro 20 mg daily. #Mediport has been removed. #Lung nodule, small pulmonary nodules attention on follow-up studies.    Follow-up in 3 months.  Earlie Server, MD, PhD Hematology Oncology [place] at [facility]  07/08/19

## 2019-07-09 ENCOUNTER — Ambulatory Visit (INDEPENDENT_AMBULATORY_CARE_PROVIDER_SITE_OTHER): Payer: Managed Care, Other (non HMO) | Admitting: Plastic Surgery

## 2019-07-09 ENCOUNTER — Encounter: Payer: Self-pay | Admitting: Plastic Surgery

## 2019-07-09 VITALS — BP 118/80 | HR 98 | Temp 98.2°F | Ht 67.0 in | Wt 181.4 lb

## 2019-07-09 DIAGNOSIS — C50811 Malignant neoplasm of overlapping sites of right female breast: Secondary | ICD-10-CM

## 2019-07-09 DIAGNOSIS — Z170 Estrogen receptor positive status [ER+]: Secondary | ICD-10-CM

## 2019-07-09 DIAGNOSIS — Z9011 Acquired absence of right breast and nipple: Secondary | ICD-10-CM

## 2019-07-09 NOTE — Progress Notes (Signed)
   Subjective:    Patient ID: Theresa Chaney, female    DOB: March 12, 1981, 38 y.o.   MRN: [id number]  The patient is a 38 year old female here for follow-up on her bilateral breast surgery.  She underwent mastectomies and decided on no reconstruction.  Incisions are healing well.  There is no sign of seroma or hematoma.  No sign of infection.  She has a little bit of fullness on the medial aspect of the both incisions in the lateral aspect of the left.  We will see if this settles down.  She has not started to do any massage as of yet.  She is pleased with the results.   Review of Systems  Constitutional: Negative for activity change and appetite change.  Respiratory: Negative.  Negative for chest tightness and shortness of breath.   Cardiovascular: Negative for leg swelling.  Gastrointestinal: Negative for abdominal pain.  Musculoskeletal: Negative.   Skin: Negative for color change and wound.  Psychiatric/Behavioral: Negative.        Objective:   Physical Exam Vitals signs and nursing note reviewed.  Constitutional:      Appearance: Normal appearance.  HENT:     Head: Normocephalic and atraumatic.  Cardiovascular:     Rate and Rhythm: Normal rate.  Pulmonary:     Effort: Pulmonary effort is normal.  Abdominal:     General: Abdomen is flat.  Neurological:     General: No focal deficit present.     Mental Status: She is alert and oriented to person, place, and time.  Psychiatric:        Mood and Affect: Mood normal.        Behavior: Behavior normal.        Thought Content: Thought content normal.         Assessment & Plan:     ICD-10-CM   1. Status post right mastectomy  Z90.11   2. Malignant neoplasm of overlapping sites of right breast in female, estrogen receptor positive ([place])  C50.811    Z17.0   3. Acquired absence of right breast and nipple  Z90.11   Follow-up in several months.  Continue or start massage to the thicker areas.  Can do revision if needed.  Probably  could do in the office. Pictures were obtained of the patient and placed in the chart with the patient's or guardian's permission.

## 2019-07-16 ENCOUNTER — Encounter: Payer: Managed Care, Other (non HMO) | Admitting: General Surgery

## 2019-07-17 ENCOUNTER — Encounter: Payer: Self-pay | Admitting: Surgery

## 2019-07-17 ENCOUNTER — Other Ambulatory Visit: Payer: Self-pay

## 2019-07-17 ENCOUNTER — Ambulatory Visit (INDEPENDENT_AMBULATORY_CARE_PROVIDER_SITE_OTHER): Payer: Managed Care, Other (non HMO) | Admitting: Surgery

## 2019-07-17 VITALS — Ht 67.0 in

## 2019-07-17 DIAGNOSIS — Z09 Encounter for follow-up examination after completed treatment for conditions other than malignant neoplasm: Secondary | ICD-10-CM

## 2019-07-17 NOTE — Patient Instructions (Signed)
The patient is aware to call back for any questions or new concerns.  

## 2019-07-17 NOTE — Progress Notes (Signed)
S/p L prophylactic simple mastectomy and removal of prosthesis By Dr. Bary Castilla and Dr. Marla Roe She did have a history of right breast cancer Er + PR and HEr 2 (-)  (03/2018)requiring neoadjuvant chemo followed byt modified radical mastectomy and immediate reconstruction  She sis doing well No complaints   PE NAD Incisions healing well, no infection no recurrence either on axilla or chest wall  A/p Doing very well\ No surgical issues  F/u w oncology

## 2019-07-19 ENCOUNTER — Ambulatory Visit
Admission: RE | Admit: 2019-07-19 | Discharge: 2019-07-19 | Disposition: A | Discharge status: Home / Self Care | Payer: Managed Care, Other (non HMO) | Source: Ambulatory Visit | Attending: Radiation Oncology | Admitting: Radiation Oncology

## 2019-07-19 ENCOUNTER — Encounter: Payer: Self-pay | Admitting: Radiation Oncology

## 2019-07-19 ENCOUNTER — Other Ambulatory Visit: Payer: Self-pay

## 2019-07-19 VITALS — BP 111/77 | HR 87 | Temp 97.4°F | Resp 18 | Wt 184.0 lb

## 2019-07-19 DIAGNOSIS — Z9011 Acquired absence of right breast and nipple: Secondary | ICD-10-CM | POA: Insufficient documentation

## 2019-07-19 DIAGNOSIS — Z923 Personal history of irradiation: Secondary | ICD-10-CM | POA: Insufficient documentation

## 2019-07-19 DIAGNOSIS — G620 Drug-induced polyneuropathy: Secondary | ICD-10-CM | POA: Diagnosis not present

## 2019-07-19 DIAGNOSIS — Z79811 Long term (current) use of aromatase inhibitors: Secondary | ICD-10-CM | POA: Insufficient documentation

## 2019-07-19 DIAGNOSIS — C50811 Malignant neoplasm of overlapping sites of right female breast: Secondary | ICD-10-CM

## 2019-07-19 DIAGNOSIS — T451X5A Adverse effect of antineoplastic and immunosuppressive drugs, initial encounter: Secondary | ICD-10-CM | POA: Insufficient documentation

## 2019-07-19 DIAGNOSIS — D701 Agranulocytosis secondary to cancer chemotherapy: Secondary | ICD-10-CM | POA: Insufficient documentation

## 2019-07-19 DIAGNOSIS — Z170 Estrogen receptor positive status [ER+]: Secondary | ICD-10-CM | POA: Insufficient documentation

## 2019-07-19 NOTE — Progress Notes (Signed)
Radiation Oncology Follow up Note  Name: Theresa Chaney   Date:   07/19/2019 MRN:  [id number] DOB: January 17, 1981    This 38 y.o. female presents to the clinic today for 27-monthfollow-up status post right chest and peripheral emphatic radiation therapy for stage IIIa ER positive invasive mammary carcinoma status post right modified radical mastectomy..[name] REFERRING PROVIDER: MMarguerita Merles MD  HPI: Patient is a 38year old female status post bilateral mastectomies who received right chest wall and peripheral emphatic radiation therapy for stage IIIa ER positive PR negative HER-2/neu not overexpressed invasive mammary carcinoma.  Seen today in routine follow-up she is doing well she specifically denies any new nodularity or discomfort in her mastectomy scars.  She also specifically denies cough, chest tightness or bone pain.  She is currently on letrozole tolerating that well.  She is not had any imaging studies.  She does have chemotherapy-induced peripheral neuropathy.  She has specifically denies any swelling in her right upper extremity.  COMPLICATIONS OF TREATMENT: none  FOLLOW UP COMPLIANCE: keeps appointments   PHYSICAL EXAM:  BP 111/77 (BP Location: Left Arm, Patient Position: Sitting)   Pulse 87   Temp (!) 97.4 F (36.3 C) (Tympanic)   Resp 18   Wt 184 lb (83.5 kg)   LMP  (LMP Unknown) Comment: Ovaries and tubes removed.  BMI 28.82 kg/m  Patient status post bilateral mastectomies no evidence of chest wall mass or nodularity is noted no axillary or supraclavicular adenopathy is identified no evidence of lymphedema in her right upper extremity is noted.  Well-developed well-nourished patient in NAD. HEENT reveals PERLA, EOMI, discs not visualized.  Oral cavity is clear. No oral mucosal lesions are identified. Neck is clear without evidence of cervical or supraclavicular adenopathy. Lungs are clear to A&P. Cardiac examination is essentially unremarkable with regular rate and rhythm without  murmur rub or thrill. Abdomen is benign with no organomegaly or masses noted. Motor sensory and DTR levels are equal and symmetric in the upper and lower extremities. Cranial nerves II through XII are grossly intact. Proprioception is intact. No peripheral adenopathy or edema is identified. No motor or sensory levels are noted. Crude visual fields are within normal range.  RADIOLOGY RESULTS: No current films for review  PLAN: Present time she is now at 11 months radiation therapy to her right chest wall is doing well with no evidence of disease.  I am pleased with her overall progress.  I have asked to see her back in 1 year for follow-up.  Patient knows to call sooner with any concerns.  I would like to take this opportunity to thank you for allowing me to participate in the care of your patient..Noreene Filbert MD

## 2019-07-19 NOTE — Addendum Note (Signed)
Encounter addended by: Noreene Filbert, MD on: 07/19/2019 11:40 AM  Actions taken: Level of Service modified

## 2019-08-05 ENCOUNTER — Other Ambulatory Visit: Payer: Self-pay | Admitting: *Deleted

## 2019-08-05 NOTE — Telephone Encounter (Signed)
Please send requests to PCP thanks.

## 2019-08-12 ENCOUNTER — Ambulatory Visit: Payer: Managed Care, Other (non HMO) | Attending: Rheumatology | Admitting: Occupational Therapy

## 2019-09-23 ENCOUNTER — Encounter: Payer: Self-pay | Admitting: Oncology

## 2019-09-23 ENCOUNTER — Other Ambulatory Visit: Payer: Self-pay

## 2019-09-23 NOTE — Progress Notes (Signed)
Patient sent MyChart message with new medications she is taking. Medications have been added to medication list. Magnesium, albuterol, folic acid, Vitamin [id number] injection and Flexeril.

## 2019-10-03 DIAGNOSIS — M7989 Other specified soft tissue disorders: Secondary | ICD-10-CM | POA: Insufficient documentation

## 2019-10-04 ENCOUNTER — Ambulatory Visit: Payer: Self-pay | Admitting: Plastic Surgery

## 2019-10-09 ENCOUNTER — Other Ambulatory Visit: Payer: Self-pay

## 2019-10-09 ENCOUNTER — Encounter: Payer: Self-pay | Admitting: Oncology

## 2019-10-09 DIAGNOSIS — C50411 Malignant neoplasm of upper-outer quadrant of right female breast: Secondary | ICD-10-CM

## 2019-10-09 DIAGNOSIS — Z170 Estrogen receptor positive status [ER+]: Secondary | ICD-10-CM

## 2019-10-09 NOTE — Progress Notes (Signed)
Patient stated that she had been having trouble with her not been able to sleep at night.

## 2019-10-10 ENCOUNTER — Inpatient Hospital Stay: Payer: Managed Care, Other (non HMO) | Attending: Oncology

## 2019-10-10 ENCOUNTER — Inpatient Hospital Stay (HOSPITAL_BASED_OUTPATIENT_CLINIC_OR_DEPARTMENT_OTHER): Payer: Managed Care, Other (non HMO) | Admitting: Oncology

## 2019-10-10 ENCOUNTER — Encounter: Payer: Self-pay | Admitting: Oncology

## 2019-10-10 ENCOUNTER — Other Ambulatory Visit: Payer: Self-pay

## 2019-10-10 ENCOUNTER — Telehealth: Payer: Self-pay | Admitting: *Deleted

## 2019-10-10 VITALS — BP 116/70 | HR 96 | Temp 97.7°F | Resp 16 | Wt 185.7 lb

## 2019-10-10 DIAGNOSIS — G4700 Insomnia, unspecified: Secondary | ICD-10-CM | POA: Insufficient documentation

## 2019-10-10 DIAGNOSIS — F329 Major depressive disorder, single episode, unspecified: Secondary | ICD-10-CM | POA: Insufficient documentation

## 2019-10-10 DIAGNOSIS — C50411 Malignant neoplasm of upper-outer quadrant of right female breast: Secondary | ICD-10-CM

## 2019-10-10 DIAGNOSIS — Z90722 Acquired absence of ovaries, bilateral: Secondary | ICD-10-CM | POA: Diagnosis not present

## 2019-10-10 DIAGNOSIS — Z9221 Personal history of antineoplastic chemotherapy: Secondary | ICD-10-CM | POA: Diagnosis not present

## 2019-10-10 DIAGNOSIS — Z79811 Long term (current) use of aromatase inhibitors: Secondary | ICD-10-CM | POA: Insufficient documentation

## 2019-10-10 DIAGNOSIS — Z170 Estrogen receptor positive status [ER+]: Secondary | ICD-10-CM

## 2019-10-10 DIAGNOSIS — G620 Drug-induced polyneuropathy: Secondary | ICD-10-CM

## 2019-10-10 DIAGNOSIS — R911 Solitary pulmonary nodule: Secondary | ICD-10-CM

## 2019-10-10 DIAGNOSIS — T451X5A Adverse effect of antineoplastic and immunosuppressive drugs, initial encounter: Secondary | ICD-10-CM

## 2019-10-10 DIAGNOSIS — R918 Other nonspecific abnormal finding of lung field: Secondary | ICD-10-CM | POA: Insufficient documentation

## 2019-10-10 DIAGNOSIS — Z9013 Acquired absence of bilateral breasts and nipples: Secondary | ICD-10-CM | POA: Insufficient documentation

## 2019-10-10 DIAGNOSIS — Z923 Personal history of irradiation: Secondary | ICD-10-CM | POA: Diagnosis not present

## 2019-10-10 DIAGNOSIS — N951 Menopausal and female climacteric states: Secondary | ICD-10-CM

## 2019-10-10 DIAGNOSIS — M2550 Pain in unspecified joint: Secondary | ICD-10-CM | POA: Diagnosis not present

## 2019-10-10 DIAGNOSIS — R110 Nausea: Secondary | ICD-10-CM | POA: Diagnosis not present

## 2019-10-10 LAB — COMPREHENSIVE METABOLIC PANEL
ALT: 15 U/L (ref 0–44)
AST: 22 U/L (ref 15–41)
Albumin: 4.4 g/dL (ref 3.5–5.0)
Alkaline Phosphatase: 85 U/L (ref 38–126)
Anion gap: 11 (ref 5–15)
BUN: 15 mg/dL (ref 6–20)
CO2: 25 mmol/L (ref 22–32)
Calcium: 9.2 mg/dL (ref 8.9–10.3)
Chloride: 102 mmol/L (ref 98–111)
Creatinine, Ser: 0.77 mg/dL (ref 0.44–1.00)
GFR calc Af Amer: >60 mL/min (ref >60)
GFR calc non Af Amer: >60 mL/min (ref >60)
Glucose, Bld: 146 mg/dL — ABNORMAL HIGH (ref 70–99)
Potassium: 4.1 mmol/L (ref 3.5–5.1)
Sodium: 138 mmol/L (ref 135–145)
Total Bilirubin: 0.5 mg/dL (ref 0.3–1.2)
Total Protein: 7.8 g/dL (ref 6.5–8.1)

## 2019-10-10 LAB — CBC WITH DIFFERENTIAL/PLATELET
Abs Immature Granulocytes: 0.03 10*3/uL (ref 0.00–0.07)
Basophils Absolute: 0.1 10*3/uL (ref 0.0–0.1)
Basophils Relative: 1 %
Eosinophils Absolute: 0.1 10*3/uL (ref 0.0–0.5)
Eosinophils Relative: 1 %
HCT: 36.7 % (ref 36.0–46.0)
Hemoglobin: 12.2 g/dL (ref 12.0–15.0)
Immature Granulocytes: 0 %
Lymphocytes Relative: 30 %
Lymphs Abs: 3.3 10*3/uL (ref 0.7–4.0)
MCH: 29.8 pg (ref 26.0–34.0)
MCHC: 33.2 g/dL (ref 30.0–36.0)
MCV: 89.7 fL (ref 80.0–100.0)
Monocytes Absolute: 0.9 10*3/uL (ref 0.1–1.0)
Monocytes Relative: 8 %
Neutro Abs: 6.5 10*3/uL (ref 1.7–7.7)
Neutrophils Relative %: 60 %
Platelets: 295 10*3/uL (ref 150–400)
RBC: 4.09 MIL/uL (ref 3.87–5.11)
RDW: 13.5 % (ref 11.5–15.5)
WBC: 10.9 10*3/uL — ABNORMAL HIGH (ref 4.0–10.5)
nRBC: 0 % (ref 0.0–0.2)

## 2019-10-10 MED ORDER — CALCIUM CARBONATE-VITAMIN D3 600-400 MG-UNIT PO TABS: 2.0000 | ORAL_TABLET | Freq: Every day | ORAL | 0 refills | Status: DC

## 2019-10-10 MED ORDER — CALCIUM 1000 + D 1000-800 MG-UNIT PO TABS: 1000.0000 mg | ORAL_TABLET | Freq: Every day | ORAL | 0 refills | Status: DC

## 2019-10-10 MED ORDER — PROMETHAZINE HCL 25 MG PO TABS: 25.0000 mg | ORAL_TABLET | Freq: Three times a day (TID) | ORAL | 0 refills | Status: DC | PRN

## 2019-10-10 NOTE — Telephone Encounter (Signed)
OK to change Dr. Tasia Catchings?  I will let them know and make change in chart.

## 2019-10-10 NOTE — Telephone Encounter (Signed)
Ok with the change.

## 2019-10-10 NOTE — Telephone Encounter (Signed)
Pharmacy informed.

## 2019-10-10 NOTE — Telephone Encounter (Signed)
Prescription for Calcium and Vit D is not available in strength ordered and asking if it can be changed to 600/400 tablet and take 2. Please return call to [phone]

## 2019-10-11 ENCOUNTER — Ambulatory Visit: Payer: Self-pay | Admitting: Plastic Surgery

## 2019-10-13 DIAGNOSIS — M069 Rheumatoid arthritis, unspecified: Secondary | ICD-10-CM | POA: Insufficient documentation

## 2019-10-13 DIAGNOSIS — M0520 Rheumatoid vasculitis with rheumatoid arthritis of unspecified site: Secondary | ICD-10-CM | POA: Insufficient documentation

## 2019-10-13 NOTE — Progress Notes (Signed)
Hematology/Oncology Follow Up Note [facility] [phone] [phone]  Patient Care Team: Marguerita Merles, MD as PCP - General (Family Medicine) End, Harrell Gave, MD as PCP - Cardiology (Cardiology) Rico Junker, RN as Oncology Nurse Navigator Earlie Server, MD as Consulting Physician (Oncology) Bary Castilla, Forest Gleason, MD (General Surgery) Noreene Filbert, MD as Referring Physician (Radiation Oncology)   Name of the patient: Theresa Chaney  [id number]  Jul 12, 1981   Date of visit: 10/13/19 REASON FOR VISIT Follow up for Assessment prior to chemotherapy treatment of breast cancer  Oncology History 10/11/2017 Diagnosed with [id number]  Neoadjuvant dose dense AC and 1 cycle of Taxol.  Interim image showed no treatment response. 03/19/2018 right mastectomy and right axillary dissection, bilateral salpingo-oophorectomy. ypT3 ypN2 #Negative genetic testing #06/11/2018 Finish adjuvant weekly Taxol x11 Grade 2 neuropathy  #right immediate breast reconstruction with placement of tissue expanders.   Chemotherapy-induced neuropathy, patient  Lyrica 300 mg daily.   She was taking Cymbalta which did not improve her symptoms.  Also reports that the Cymbalta makes patient feel nauseous. So she stopped the Cymbalta and ask for other medications to help with her depression.  She used to be on Lexapro which works fine for her.  Patient was restarted on Prozac 20 mg daily.   Cancer TREATMENT Neoadjuvant ddAC +one dose of Taxol, due to lack of response, surgery was offered. Case was discussed on breast tumor board. 03/19/2018 S/p right mastectomy and right axillary dissection, immediate breast reconstruction with placement of expanders. Also had elective bilateral salpingo-oophorectomy..  # s/p 11 cycles Taxol adjuvantly. Tolerated well.  # She has obtained dental clearance for starting Zometa.  S/p Zometa on 6/3/ 2019 # s/p adjuvant radiation, finished 10/10/2018  #06/03/2019 underwent elective left prophylactic mastectomy and sentinel lymph node biopsy of left axilla.  # 06/03/2019 underwent elective left prophylactic mastectomy and sentinel lymph node biopsy of left axilla. Pathology negative for malignancy. Status post Mediport removal on 06/03/2019. She also underwent right implant removal on 06/03/2019.    INTERVAL HISTORY 38yo female with above oncology history reviewed by me presents for follow-up of management of breast cancer.  Patient has completed right breast reconstruction on 07/20/2018 and s/p adjuvant radiation, finished 10/10/2018.  ##Patient followed up with Dr.Byrnett for post op evaluation.  Note reviewed.  #Chemotherapy-induced neuropathy, currently on Lyrica 300 mg daily, nortriptyline she follows up with neurology  #Takes letrozole 2.5 mg daily.  She has chronic hot flashes, intermittently. Continue to have chronic joint pains. Bilateral upper extremity swelling as improved .  She follows up with lymphedema clinic. Appetite has been fair.  She has gained 4 pounds since last visit.  She has intermittent nausea in the morning.  Requests refill of Phenergan.    Review of Systems  Constitutional: Negative for chills, fever, malaise/fatigue and weight loss.  HENT: Negative for sore throat.   Eyes: Negative for redness.  Respiratory: Negative for cough, shortness of breath and wheezing.   Cardiovascular: Negative for chest pain, palpitations and leg swelling.  Gastrointestinal: Negative for abdominal pain, blood in stool, nausea and vomiting.  Genitourinary: Negative for dysuria.  Musculoskeletal: Positive for joint pain. Negative for myalgias.  Skin: Negative for rash.  Neurological: Positive for tingling. Negative for dizziness and tremors.  Endo/Heme/Allergies: Does not bruise/bleed easily.  Psychiatric/Behavioral: Negative for hallucinations.    No Known Allergies  Patient Active Problem List   Diagnosis Date Noted  .  Fracture of neck of metacarpal bone 05/14/2019  .  Chronic fatigue 04/09/2019  . Polyarthralgia 04/09/2019  . Status post right breast reconstruction 02/26/2019  . Status post right mastectomy 02/26/2019  . Mastalgia 02/15/2019  . Shortness of breath 08/23/2018  . Nonischemic cardiomyopathy ([place]) 08/23/2018  . Preprocedural cardiovascular examination 08/23/2018  . Tachycardia 08/23/2018  . Palpitations 08/23/2018  . Estrogen receptor positive status (ER+) 04/04/2018  . Acquired absence of right breast and nipple 04/03/2018  . Breast cancer of upper-outer quadrant of right female breast ([place]) 03/19/2018  . Family history of cancer   . Malignant neoplasm of overlapping sites of right breast in female, estrogen receptor positive ([place]) 10/19/2017  . Gastroesophageal reflux disease without esophagitis 02/24/2017  . Generalized anxiety disorder 10/03/2014  . Headache 10/03/2014     Past Medical History:  Diagnosis Date  . Anemia   . BRCA negative 11/26/2017  . Breast cancer ([place]) 10/11/2017   Multifocal, ER positive, PR negative, HER-2 negative. ypT3 ypN2a 8.7 cm, 4/15 nodes  . Cardiomyopathy ([place])    a. 10/2017 Echo: EF 60-65%, no rwma, Gr1 DD, nl RV size/fxn; b. 04/2018 Echo: EF 55-60%, no rwma, Nl RV size/fxn; c. 08/2018 Echo: EF 45%, diff HK, ? HK of antsept wall. Gr1 DD. Mild MR. Mild LAE/RAE. Mod dil RV.   [name] Chronic bronchitis ([place]) 11/2017  . COPD (chronic obstructive pulmonary disease) ([place])    MILD PER CXR  . Depression   . Family history of cancer   . GERD (gastroesophageal reflux disease)   . Headache    MIGRAINES  . Heart murmur    ASYMPTOMATIC  . Personal history of chemotherapy    current for right breast ca     Past Surgical History:  Procedure Laterality Date  . AXILLARY LYMPH NODE DISSECTION Right 03/19/2018   Procedure: AXILLARY LYMPH NODE DISSECTION;  Surgeon: Robert Bellow, MD;  Location: [facility];  Service: General;  Laterality: Right;  . BREAST BIOPSY  Right 10/11/2017   12:30 posterior coil clip invasive mammary carcinoma  . BREAST BIOPSY Right 10/11/2017   11:30 middle depth ribbon clip DCIS  . BREAST BIOPSY Right 10/11/2017   5:30 anterior depth x shape invasive ductal carcinoma  . BREAST IMPLANT REMOVAL Right 06/03/2019   Procedure: REMOVAL OF RIGHT BREAST IMPLANTS;  Surgeon: Wallace Going, DO;  Location: [facility];  Service: Plastics;  Laterality: Right;  . BREAST RECONSTRUCTION WITH PLACEMENT OF TISSUE EXPANDER AND FLEX HD (ACELLULAR HYDRATED DERMIS) Right 03/19/2018   Procedure: BREAST RECONSTRUCTION WITH PLACEMENT OF TISSUE EXPANDER AND FLEX HD (ACELLULAR HYDRATED DERMIS);  Surgeon: Wallace Going, DO;  Location: [facility];  Service: Plastics;  Laterality: Right;  . LAPAROSCOPIC BILATERAL SALPINGO OOPHERECTOMY Bilateral 03/19/2018   Procedure: LAPAROSCOPIC BILATERAL SALPINGO OOPHORECTOMY;  Surgeon: Benjaman Kindler, MD;  Location: [facility];  Service: Gynecology;  Laterality: Bilateral;  . MASTECTOMY Right 03/2018  . MASTECTOMY W/ SENTINEL NODE BIOPSY Right 03/19/2018   Procedure: MASTECTOMY WITH SENTINEL LYMPH NODE BIOPSY;  Surgeon: Robert Bellow, MD;  Location: [facility];  Service: General;  Laterality: Right;  . PORT-A-CATH REMOVAL Left 06/03/2019   Procedure: REMOVAL PORT-A-CATH;  Surgeon: Robert Bellow, MD;  Location: [facility];  Service: General;  Laterality: Left;  . PORTACATH PLACEMENT Left 10/24/2017   Procedure: INSERTION PORT-A-CATH;  Surgeon: Robert Bellow, MD;  Location: [facility];  Service: General;  Laterality: Left;  . REMOVAL OF TISSUE EXPANDER AND PLACEMENT OF IMPLANT Right 07/20/2018   Procedure: REMOVAL OF RIGHT BREAST TISSUE EXPANDER AND PLACEMENT OF IMPLANT;  Surgeon: Wallace Going, DO;  Location: [place];  Service: Plastics;  Laterality: Right;  . SIMPLE MASTECTOMY WITH AXILLARY SENTINEL NODE BIOPSY Left 06/03/2019   Procedure: SIMPLE MASTECTOMY LEFT;  Surgeon: Robert Bellow, MD;  Location: [facility];  Service: General;  Laterality: Left;    Social History   Socioeconomic History  . Marital status: Married    Spouse name: Not on file  . Number of children: Not on file  . Years of education: Not on file  . Highest education level: Not on file  Occupational History  . Occupation: Occupational psychologist    Comment: [employer]  . Financial resource strain: Not on file  . Food insecurity    Worry: Not on file    Inability: Not on file  . Transportation needs    Medical: Not on file    Non-medical: Not on file  Tobacco Use  . Smoking status: Current Every Day Smoker    Packs/day: 0.50    Years: 18.00    Pack years: 9.00    Types: Cigarettes    Start date: 06/21/2018  . Smokeless tobacco: Never Used  Substance and Sexual Activity  . Alcohol use: No    Frequency: Never  . Drug use: No  . Sexual activity: Yes    Birth control/protection: Injection  Lifestyle  . Physical activity    Days per week: Not on file    Minutes per session: Not on file  . Stress: Not on file  Relationships  . Social Herbalist on phone: Not on file    Gets together: Not on file    Attends religious service: Not on file    Active member of club or organization: Not on file    Attends meetings of clubs or organizations: Not on file    Relationship status: Not on file  . Intimate partner violence    Fear of current or ex partner: Not on file    Emotionally abused: Not on file    Physically abused: Not on file    Forced sexual activity: Not on file  Other Topics Concern  . Not on file  Social History Narrative  . Not on file     Family History  Problem Relation Age of Onset  . Melanoma Maternal Aunt        other aunts with BCC/SCC/Melanoma  . Diabetes Father   . Hypertension Father   . Hyperlipidemia Father   . Heart attack Father 47       "mild"  . Bladder Cancer Maternal Grandmother   . Cervical cancer  Maternal Aunt 64       daughter w/ cervical cancer as well  . Melanoma Maternal Uncle        other uncles with BCC/SCC/Melanoma     Current Outpatient Medications:  .  albuterol (VENTOLIN HFA) 108 (90 Base) MCG/ACT inhaler, Inhale into the lungs every 6 (six) hours as needed., Disp: , Rfl:  .  buPROPion (WELLBUTRIN SR) 150 MG 12 hr tablet, Take 150 mg by mouth 2 (two) times daily., Disp: , Rfl:  .  cyanocobalamin (,VITAMIN B-12,) 1000 MCG/ML injection, Inject 1,000 mcg into the skin every 30 (thirty) days., Disp: , Rfl:  .  escitalopram (LEXAPRO) 20 MG tablet, Take 20 mg by mouth daily., Disp: , Rfl:  .  esomeprazole (NEXIUM) 40 MG capsule, Take 40 mg by mouth daily at 12 noon.,  Disp: , Rfl:  .  folic acid (FOLVITE) 1 MG tablet, Take 1 mg by mouth daily., Disp: , Rfl:  .  Investigational letrozole/placebo tablet NSABP B-42, Take 1 tablet by mouth daily., Disp: , Rfl:  .  loratadine (CLARITIN) 10 MG tablet, Take 10 mg by mouth daily., Disp: , Rfl:  .  LORazepam (ATIVAN) 1 MG tablet, Take 1 mg by mouth 3 (three) times daily., Disp: , Rfl:  .  Magnesium 400 MG TABS, Take 400 mg by mouth 2 (two) times daily., Disp: , Rfl:  .  meloxicam (MOBIC) 7.5 MG tablet, Take 7.5 mg by mouth 2 (two) times a day., Disp: , Rfl:  .  methotrexate (RHEUMATREX) 2.5 MG tablet, Take 2.5 mg by mouth once a week. Take 8 tablets once a week, Disp: , Rfl:  .  metoprolol succinate (TOPROL-XL) 25 MG 24 hr tablet, Take 11/2 tablets by mouth daily., Disp: , Rfl:  .  nortriptyline (PAMELOR) 10 MG capsule, Take 30 mg by mouth at bedtime. , Disp: , Rfl:  .  pregabalin (LYRICA) 300 MG capsule, Take 1 capsule (300 mg total) by mouth daily., Disp: 30 capsule, Rfl: 3 .  pyridOXINE (VITAMIN B-6) 100 MG tablet, Take 100 mg by mouth daily., Disp: , Rfl:  .  vitamin C (ASCORBIC ACID) 500 MG tablet, Take 500 mg by mouth daily., Disp: , Rfl:  .  Calcium Carbonate-Vitamin D3 (CALCIUM 600/VITAMIN D) 600-400 MG-UNIT TABS, Take 2 tablets by  mouth daily., Disp: 180 tablet, Rfl: 0 .  cyclobenzaprine (FLEXERIL) 5 MG tablet, Take 5 mg by mouth 3 (three) times daily., Disp: , Rfl:  .  ibuprofen (ADVIL) 800 MG tablet, Take 800 mg by mouth every 8 (eight) hours as needed., Disp: , Rfl:  .  promethazine (PHENERGAN) 25 MG tablet, Take 1 tablet (25 mg total) by mouth every 8 (eight) hours as needed for nausea or vomiting., Disp: 90 tablet, Rfl: 0 No current facility-administered medications for this visit.   Facility-Administered Medications Ordered in Other Visits:  .  heparin lock flush 100 unit/mL, 500 Units, Intravenous, Once, Earlie Server, MD .  sodium chloride flush (NS) 0.9 % injection 10 mL, 10 mL, Intravenous, PRN, Earlie Server, MD, 10 mL at 11/19/18 1249 .  sodium chloride flush (NS) 0.9 % injection 10 mL, 10 mL, Intravenous, Once, Earlie Server, MD   Physical exam:  Vitals:   10/10/19 0959  BP: 116/70  Pulse: 96  Resp: 16  Temp: 97.7 F (36.5 C)  TempSrc: Temporal  SpO2: 98%  Weight: 185 lb 11.2 oz (84.2 kg)  ECOG 1 Physical Exam  Constitutional: She is oriented to person, place, and time. No distress.  HENT:  Head: Normocephalic and atraumatic.  Nose: Nose normal.  Mouth/Throat: Oropharynx is clear and moist. No oropharyngeal exudate.  Eyes: Pupils are equal, round, and reactive to light. Conjunctivae and EOM are normal. Left eye exhibits no discharge. No scleral icterus.  Neck: Normal range of motion. Neck supple. No JVD present.  Cardiovascular: Normal rate, regular rhythm and normal heart sounds.  No murmur heard. Pulmonary/Chest: Effort normal and breath sounds normal. No respiratory distress. She has no wheezes. She has no rales. She exhibits no tenderness.  Abdominal: Soft. Bowel sounds are normal. She exhibits no distension and no mass. There is no abdominal tenderness. There is no rebound.  Musculoskeletal: Normal range of motion.        General: No edema.  Lymphadenopathy:    She has no cervical adenopathy.  Neurological: She is alert and oriented to person, place, and time. No cranial nerve deficit. She exhibits normal muscle tone. Coordination normal.  Skin: Skin is warm and dry. No rash noted. She is not diaphoretic. No erythema.  Psychiatric: Affect normal.        CMP Latest Ref Rng & Units 10/10/2019  Glucose 70 - 99 mg/dL 146(H)  BUN 6 - 20 mg/dL 15  Creatinine 0.44 - 1.00 mg/dL 0.77  Sodium 135 - 145 mmol/L 138  Potassium 3.5 - 5.1 mmol/L 4.1  Chloride 98 - 111 mmol/L 102  CO2 22 - 32 mmol/L 25  Calcium 8.9 - 10.3 mg/dL 9.2  Total Protein 6.5 - 8.1 g/dL 7.8  Total Bilirubin 0.3 - 1.2 mg/dL 0.5  Alkaline Phos 38 - 126 U/L 85  AST 15 - 41 U/L 22  ALT 0 - 44 U/L 15   CBC Latest Ref Rng & Units 10/10/2019  WBC 4.0 - 10.5 K/uL 10.9(H)  Hemoglobin 12.0 - 15.0 g/dL 12.2  Hematocrit 36.0 - 46.0 % 36.7  Platelets 150 - 400 K/uL 295   RADIOGRAPHIC STUDIES: I have personally reviewed the radiological images as listed and agreed with the findings in the report. 08/23/2018 DEXA The BMD measured at Femur Neck Right is 0.900 g/cm2 with a T-score of -1.0. This patient is considered normal according to [facility]) criteria. Site Region Measured Measured WHO Young Adult BMD Date       Age      Classification T-score AP Spine L1-L4 08/23/2018 35.5 Normal -0.8 1.091 g/cm2 DualFemur Neck Right 08/23/2018 35.5 Normal -1.0 0.900 g/cm2  08/15/2018 2D echo Study Conclusions - Left ventricle: The cavity size was mildly dilated. Systolic   function was mildly reduced. The estimated ejection fraction was   45%. Diffuse hypokinesis. Hypokinesis of the anteroseptal   myocardium. Doppler parameters are consistent with abnormal left   ventricular relaxation (grade 1 diastolic dysfunction). - Aortic valve: Valve area (VTI): 1.88 cm^2. Valve area (Vmax): 1.8   cm^2. Valve area (Vmean): 1.74 cm^2. - Mitral valve: There was mild regurgitation. Valve area by   continuity  equation (using LVOT flow): 2.22 cm^2. - Left atrium: The atrium was mildly dilated. - Right ventricle: The cavity size was moderately dilated. - Right atrium: The atrium was mildly dilated.  RADIOGRAPHIC STUDIES: I have personally reviewed the radiological images as listed and agreed with the findings in the report. 10/30/2018 Bone scan whole body Non scintigraphic evidence of osseous metastatic disease.  Assessment and plan- Patient is a 38 y.o. female presents for evaluation of newly diagnosed multicentric right breast cancer.  Cancer Staging Breast cancer of upper-outer quadrant of right female breast [facility]) Staging form: Breast, AJCC 8th Edition - Clinical: G2, ER+, PR+, HER2- - Signed by Earlie Server, MD on 04/05/2018 - Pathologic stage from 04/04/2018: No Stage Recommended (ypT3, pN2, cM0, G3, ER+, PR-, HER2-) - Signed by Earlie Server, MD on 04/04/2018    1. Malignant neoplasm of upper-outer quadrant of right breast in female, estrogen receptor positive ([place])   2. Neuropathy due to chemotherapeutic drug ([place])   3. Aromatase inhibitor use   4. Vasomotor symptoms due to menopause   5. Lung nodule    # TNM Stage IIIA right breast cancer, ER/ PR positive, HER-2 negative status post bilateral oophorectomy, Right mastectomy and right axillary lymph node dissection, status post implant, and implant removal.  Status post left mastectomy and axillary sentinel lymph node biopsy. Status post port removal.  She is clinically doing well. Labs reviewed and discussed with patient. Continue letrozole.  Plan for total of 10 years.  Discussed with patient. She has manageable hot flashes.  Intermittent nausea, Phenergan prescription was refilled. #Adjuvant bisphosphonate:  Zometa on 07/08/2019.  Due for next dose of Zometa at next visit in 3 months. Recommend patient to take calcium and vitamin supplementations.  #Insomnia, lifestyle modification discussed with patient. #Chemotherapy-induced  neuropathy, continue Lyrica and nortriptyline.  Symptoms are stable.  Follow-up with neurology.. . #Depression.  Continue Lexapro 20 mg daily. #Mediport has been removed. #Lung nodule, small pulmonary nodules attention on follow-up studies.  Plan repeat CT chest, discuss at next visit.  Follow-up in 3 months.  Earlie Server, MD, PhD Hematology Oncology [place] at [facility]  10/13/19

## 2019-11-22 ENCOUNTER — Encounter: Payer: Self-pay | Admitting: Plastic Surgery

## 2019-11-22 NOTE — Telephone Encounter (Signed)
Called and spoke with the patient regarding the MyChart message.  Informed the patient that she did not have a visit on (05/07/19).  Her visits were (02/26/19) then (05/28/19) and other visits after that.  Patient verbalized understanding and stated that she did not need those visits, and I can disregard the MyChart message.//AB/CMA

## 2020-01-09 ENCOUNTER — Other Ambulatory Visit: Payer: Self-pay

## 2020-01-09 NOTE — Progress Notes (Signed)
Patient pre screened for office appointment, no questions or concerns today. Patient reminded of upcoming appointment time and date. 

## 2020-01-10 ENCOUNTER — Inpatient Hospital Stay: Payer: Managed Care, Other (non HMO) | Admitting: *Deleted

## 2020-01-10 ENCOUNTER — Inpatient Hospital Stay: Payer: Managed Care, Other (non HMO)

## 2020-01-10 ENCOUNTER — Encounter: Payer: Self-pay | Admitting: Oncology

## 2020-01-10 ENCOUNTER — Inpatient Hospital Stay: Payer: Managed Care, Other (non HMO) | Attending: Oncology | Admitting: Oncology

## 2020-01-10 ENCOUNTER — Other Ambulatory Visit: Payer: Self-pay

## 2020-01-10 VITALS — BP 113/76 | HR 89 | Temp 96.5°F | Resp 18 | Wt 194.0 lb

## 2020-01-10 DIAGNOSIS — C50411 Malignant neoplasm of upper-outer quadrant of right female breast: Secondary | ICD-10-CM

## 2020-01-10 DIAGNOSIS — Z95828 Presence of other vascular implants and grafts: Secondary | ICD-10-CM

## 2020-01-10 DIAGNOSIS — Z90722 Acquired absence of ovaries, bilateral: Secondary | ICD-10-CM | POA: Insufficient documentation

## 2020-01-10 DIAGNOSIS — Z170 Estrogen receptor positive status [ER+]: Secondary | ICD-10-CM

## 2020-01-10 DIAGNOSIS — T451X5A Adverse effect of antineoplastic and immunosuppressive drugs, initial encounter: Secondary | ICD-10-CM

## 2020-01-10 DIAGNOSIS — R0602 Shortness of breath: Secondary | ICD-10-CM

## 2020-01-10 DIAGNOSIS — G4700 Insomnia, unspecified: Secondary | ICD-10-CM

## 2020-01-10 DIAGNOSIS — G620 Drug-induced polyneuropathy: Secondary | ICD-10-CM

## 2020-01-10 DIAGNOSIS — Z79899 Other long term (current) drug therapy: Secondary | ICD-10-CM | POA: Diagnosis not present

## 2020-01-10 DIAGNOSIS — R918 Other nonspecific abnormal finding of lung field: Secondary | ICD-10-CM | POA: Diagnosis not present

## 2020-01-10 DIAGNOSIS — F329 Major depressive disorder, single episode, unspecified: Secondary | ICD-10-CM

## 2020-01-10 DIAGNOSIS — Z9013 Acquired absence of bilateral breasts and nipples: Secondary | ICD-10-CM | POA: Diagnosis not present

## 2020-01-10 DIAGNOSIS — Z9011 Acquired absence of right breast and nipple: Secondary | ICD-10-CM

## 2020-01-10 DIAGNOSIS — F17210 Nicotine dependence, cigarettes, uncomplicated: Secondary | ICD-10-CM

## 2020-01-10 DIAGNOSIS — Z79811 Long term (current) use of aromatase inhibitors: Secondary | ICD-10-CM | POA: Diagnosis not present

## 2020-01-10 LAB — COMPREHENSIVE METABOLIC PANEL
ALT: 18 U/L (ref 0–44)
AST: 23 U/L (ref 15–41)
Albumin: 4.5 g/dL (ref 3.5–5.0)
Alkaline Phosphatase: 94 U/L (ref 38–126)
Anion gap: 7 (ref 5–15)
BUN: 14 mg/dL (ref 6–20)
CO2: 24 mmol/L (ref 22–32)
Calcium: 9.3 mg/dL (ref 8.9–10.3)
Chloride: 106 mmol/L (ref 98–111)
Creatinine, Ser: 0.8 mg/dL (ref 0.44–1.00)
GFR calc Af Amer: >60 mL/min (ref >60)
GFR calc non Af Amer: >60 mL/min (ref >60)
Glucose, Bld: 91 mg/dL (ref 70–99)
Potassium: 3.5 mmol/L (ref 3.5–5.1)
Sodium: 137 mmol/L (ref 135–145)
Total Bilirubin: 0.4 mg/dL (ref 0.3–1.2)
Total Protein: 7.7 g/dL (ref 6.5–8.1)

## 2020-01-10 LAB — VITAMIN D 25 HYDROXY (VIT D DEFICIENCY, FRACTURES): Vit D, 25-Hydroxy: 22.69 ng/mL — ABNORMAL LOW (ref 30–100)

## 2020-01-10 LAB — CBC WITH DIFFERENTIAL/PLATELET
Abs Immature Granulocytes: 0.01 10*3/uL (ref 0.00–0.07)
Basophils Absolute: 0 10*3/uL (ref 0.0–0.1)
Basophils Relative: 0 %
Eosinophils Absolute: 0.1 10*3/uL (ref 0.0–0.5)
Eosinophils Relative: 1 %
HCT: 37 % (ref 36.0–46.0)
Hemoglobin: 12 g/dL (ref 12.0–15.0)
Immature Granulocytes: 0 %
Lymphocytes Relative: 37 %
Lymphs Abs: 3.1 10*3/uL (ref 0.7–4.0)
MCH: 29.9 pg (ref 26.0–34.0)
MCHC: 32.4 g/dL (ref 30.0–36.0)
MCV: 92.3 fL (ref 80.0–100.0)
Monocytes Absolute: 0.6 10*3/uL (ref 0.1–1.0)
Monocytes Relative: 7 %
Neutro Abs: 4.6 10*3/uL (ref 1.7–7.7)
Neutrophils Relative %: 55 %
Platelets: 298 10*3/uL (ref 150–400)
RBC: 4.01 MIL/uL (ref 3.87–5.11)
RDW: 13.4 % (ref 11.5–15.5)
WBC: 8.5 10*3/uL (ref 4.0–10.5)
nRBC: 0 % (ref 0.0–0.2)

## 2020-01-10 MED ORDER — ZOLEDRONIC ACID 4 MG/100ML IV SOLN
4.0000 mg | Freq: Once | INTRAVENOUS | Status: DC
  Filled 2020-01-10: qty 100

## 2020-01-10 MED ORDER — SODIUM CHLORIDE 0.9% FLUSH
10.0000 mL | Freq: Once | INTRAVENOUS | Status: DC
  Filled 2020-01-10: qty 10

## 2020-01-10 MED ORDER — SODIUM CHLORIDE 0.9 % IV SOLN
Freq: Once | INTRAVENOUS | Status: AC
  Filled 2020-01-10: qty 250

## 2020-01-10 MED ORDER — ZOLEDRONIC ACID 4 MG/100ML IV SOLN
4.0000 mg | Freq: Once | INTRAVENOUS | Status: AC
  Administered 2020-01-10: 4 mg via INTRAVENOUS

## 2020-01-10 NOTE — Progress Notes (Signed)
Patient does not offer any problems today.  

## 2020-01-11 ENCOUNTER — Encounter: Payer: Self-pay | Admitting: Oncology

## 2020-01-11 ENCOUNTER — Other Ambulatory Visit: Payer: Self-pay | Admitting: Oncology

## 2020-01-11 NOTE — Progress Notes (Addendum)
Hematology/Oncology Follow Up Note [facility] Telephone:([phone] [phone]  Patient Care Team: Marguerita Merles, MD as PCP - General (Family Medicine) End, Harrell Gave, MD as PCP - Cardiology (Cardiology) Rico Junker, RN as Oncology Nurse Navigator Earlie Server, MD as Consulting Physician (Oncology) Bary Castilla, Forest Gleason, MD (General Surgery) Noreene Filbert, MD as Referring Physician (Radiation Oncology)   Name of the patient: Theresa Chaney  [id number]  12/18/1980   Date of visit: 01/11/20 REASON FOR VISIT Follow up for Assessment prior to chemotherapy treatment of breast cancer  Oncology History 10/11/2017 Diagnosed with cT92mcN0  Neoadjuvant dose dense AC and 1 cycle of Taxol.  Interim image showed no treatment response. 03/19/2018 right mastectomy and right axillary dissection, bilateral salpingo-oophorectomy. ypT3 ypN2 #Negative genetic testing #06/11/2018 Finish adjuvant weekly Taxol x11 Grade 2 neuropathy  #right immediate breast reconstruction with placement of tissue expanders.   Chemotherapy-induced neuropathy, patient  Lyrica 300 mg daily.   She was taking Cymbalta which did not improve her symptoms.  Also reports that the Cymbalta makes patient feel nauseous. So she stopped the Cymbalta and ask for other medications to help with her depression.  She used to be on Lexapro which works fine for her.  Patient was restarted on Prozac 20 mg daily.   Cancer TREATMENT Neoadjuvant ddAC +one dose of Taxol, due to lack of response, surgery was offered. Case was discussed on breast tumor board. 03/19/2018 S/p right mastectomy and right axillary dissection, immediate breast reconstruction with placement of expanders. Also had elective bilateral salpingo-oophorectomy..  # s/p 11 cycles Taxol adjuvantly. Tolerated well.  # She has obtained dental clearance for starting Zometa.  S/p Zometa on 6/3/ 2019 # s/p adjuvant radiation, finished  10/10/2018 #06/03/2019 underwent elective left prophylactic mastectomy and sentinel lymph node biopsy of left axilla.  # 06/03/2019 underwent elective left prophylactic mastectomy and sentinel lymph node biopsy of left axilla. Pathology negative for malignancy. Status post Mediport removal on 06/03/2019. She also underwent right implant removal on 06/03/2019.    INTERVAL HISTORY 39yo female with above oncology history reviewed by me presents for follow-up of management of breast cancer.  Patient has completed right breast reconstruction on 07/20/2018 and s/p adjuvant radiation, finished 10/10/2018.  Patient continues to have chemotherapy-induced neuropathy, bilateral fingertips and lower extremities.  Patient is on Lyrica and nortriptyline.  Follows up with neurology.  Bilateral chronic joint pain. Appetite is fair. Patient takes letrozole 2.5 mg daily.  Chronic hot flush.   #Chemotherapy-induced neuropathy, currently on Lyrica 300 mg daily, nortriptyline she follows up with neurology     Review of Systems  Constitutional: Negative for chills, fever, malaise/fatigue and weight loss.  HENT: Negative for sore throat.   Eyes: Negative for redness.  Respiratory: Positive for shortness of breath. Negative for cough and wheezing.   Cardiovascular: Negative for chest pain, palpitations and leg swelling.  Gastrointestinal: Negative for abdominal pain, blood in stool, nausea and vomiting.  Genitourinary: Negative for dysuria.  Musculoskeletal: Positive for joint pain. Negative for myalgias.  Skin: Negative for rash.  Neurological: Positive for tingling. Negative for dizziness and tremors.  Endo/Heme/Allergies: Does not bruise/bleed easily.  Psychiatric/Behavioral: Negative for hallucinations.    No Known Allergies  Patient Active Problem List   Diagnosis Date Noted  . Fracture of neck of metacarpal bone 05/14/2019  . Chronic fatigue 04/09/2019  . Polyarthralgia 04/09/2019  . Status  post right breast reconstruction 02/26/2019  . Status post right mastectomy 02/26/2019  . Mastalgia  02/15/2019  . Shortness of breath 08/23/2018  . Nonischemic cardiomyopathy ([place]) 08/23/2018  . Preprocedural cardiovascular examination 08/23/2018  . Tachycardia 08/23/2018  . Palpitations 08/23/2018  . Estrogen receptor positive status (ER+) 04/04/2018  . Acquired absence of right breast and nipple 04/03/2018  . Breast cancer of upper-outer quadrant of right female breast ([place]) 03/19/2018  . Family history of cancer   . Malignant neoplasm of overlapping sites of right breast in female, estrogen receptor positive ([place]) 10/19/2017  . Gastroesophageal reflux disease without esophagitis 02/24/2017  . Generalized anxiety disorder 10/03/2014  . Headache 10/03/2014     Past Medical History:  Diagnosis Date  . Anemia   . BRCA negative 11/26/2017  . Breast cancer ([place]) 10/11/2017   Multifocal, ER positive, PR negative, HER-2 negative. ypT3 ypN2a 8.7 cm, 4/15 nodes  . Cardiomyopathy ([place])    a. 10/2017 Echo: EF 60-65%, no rwma, Gr1 DD, nl RV size/fxn; b. 04/2018 Echo: EF 55-60%, no rwma, Nl RV size/fxn; c. 08/2018 Echo: EF 45%, diff HK, ? HK of antsept wall. Gr1 DD. Mild MR. Mild LAE/RAE. Mod dil RV.   [name] Chronic bronchitis ([place]) 11/2017  . COPD (chronic obstructive pulmonary disease) ([place])    MILD PER CXR  . Depression   . Family history of cancer   . GERD (gastroesophageal reflux disease)   . Headache    MIGRAINES  . Heart murmur    ASYMPTOMATIC  . Personal history of chemotherapy    current for right breast ca     Past Surgical History:  Procedure Laterality Date  . AXILLARY LYMPH NODE DISSECTION Right 03/19/2018   Procedure: AXILLARY LYMPH NODE DISSECTION;  Surgeon: Robert Bellow, MD;  Location: [facility];  Service: General;  Laterality: Right;  . BREAST BIOPSY Right 10/11/2017   12:30 posterior coil clip invasive mammary carcinoma  . BREAST BIOPSY Right 10/11/2017   11:30  middle depth ribbon clip DCIS  . BREAST BIOPSY Right 10/11/2017   5:30 anterior depth x shape invasive ductal carcinoma  . BREAST IMPLANT REMOVAL Right 06/03/2019   Procedure: REMOVAL OF RIGHT BREAST IMPLANTS;  Surgeon: [place] Going, DO;  Location: [facility];  Service: Plastics;  Laterality: Right;  . BREAST RECONSTRUCTION WITH PLACEMENT OF TISSUE EXPANDER AND FLEX HD (ACELLULAR HYDRATED DERMIS) Right 03/19/2018   Procedure: BREAST RECONSTRUCTION WITH PLACEMENT OF TISSUE EXPANDER AND FLEX HD (ACELLULAR HYDRATED DERMIS);  Surgeon: [place] Going, DO;  Location: [facility];  Service: Plastics;  Laterality: Right;  . LAPAROSCOPIC BILATERAL SALPINGO OOPHERECTOMY Bilateral 03/19/2018   Procedure: LAPAROSCOPIC BILATERAL SALPINGO OOPHORECTOMY;  Surgeon: Benjaman Kindler, MD;  Location: [facility];  Service: Gynecology;  Laterality: Bilateral;  . MASTECTOMY Right 03/2018  . MASTECTOMY W/ SENTINEL NODE BIOPSY Right 03/19/2018   Procedure: MASTECTOMY WITH SENTINEL LYMPH NODE BIOPSY;  Surgeon: Robert Bellow, MD;  Location: [facility];  Service: General;  Laterality: Right;  . PORT-A-CATH REMOVAL Left 06/03/2019   Procedure: REMOVAL PORT-A-CATH;  Surgeon: Robert Bellow, MD;  Location: [facility];  Service: General;  Laterality: Left;  . PORTACATH PLACEMENT Left 10/24/2017   Procedure: INSERTION PORT-A-CATH;  Surgeon: Robert Bellow, MD;  Location: [facility];  Service: General;  Laterality: Left;  . REMOVAL OF TISSUE EXPANDER AND PLACEMENT OF IMPLANT Right 07/20/2018   Procedure: REMOVAL OF RIGHT BREAST TISSUE EXPANDER AND PLACEMENT OF IMPLANT;  Surgeon: [place] Going, DO;  Location: [place];  Service: Plastics;  Laterality: Right;  . SIMPLE MASTECTOMY WITH AXILLARY SENTINEL NODE  BIOPSY Left 06/03/2019   Procedure: SIMPLE MASTECTOMY LEFT;  Surgeon: Robert Bellow, MD;  Location: [facility];  Service: General;  Laterality: Left;    Social History   Socioeconomic History   . Marital status: Married    Spouse name: Not on file  . Number of children: Not on file  . Years of education: Not on file  . Highest education level: Not on file  Occupational History  . Occupation: Occupational psychologist    Comment: Therapist, art   Tobacco Use  . Smoking status: Current Every Day Smoker    Packs/day: 0.50    Years: 18.00    Pack years: 9.00    Types: Cigarettes    Start date: 06/21/2018  . Smokeless tobacco: Never Used  Substance and Sexual Activity  . Alcohol use: No  . Drug use: No  . Sexual activity: Yes    Birth control/protection: Injection  Other Topics Concern  . Not on file  Social History Narrative  . Not on file   Social Determinants of Health   Financial Resource Strain:   . Difficulty of Paying Living Expenses: Not on file  Food Insecurity:   . Worried About Charity fundraiser in the Last Year: Not on file  . Ran Out of Food in the Last Year: Not on file  Transportation Needs:   . Lack of Transportation (Medical): Not on file  . Lack of Transportation (Non-Medical): Not on file  Physical Activity:   . Days of Exercise per Week: Not on file  . Minutes of Exercise per Session: Not on file  Stress:   . Feeling of Stress : Not on file  Social Connections:   . Frequency of Communication with Friends and Family: Not on file  . Frequency of Social Gatherings with Friends and Family: Not on file  . Attends Religious Services: Not on file  . Active Member of Clubs or Organizations: Not on file  . Attends Archivist Meetings: Not on file  . Marital Status: Not on file  Intimate Partner Violence:   . Fear of Current or Ex-Partner: Not on file  . Emotionally Abused: Not on file  . Physically Abused: Not on file  . Sexually Abused: Not on file     Family History  Problem Relation Age of Onset  . Melanoma Maternal Aunt        other aunts with BCC/SCC/Melanoma  . Diabetes Father   . Hypertension Father   .  Hyperlipidemia Father   . Heart attack Father 39       "mild"  . Bladder Cancer Maternal Grandmother   . Cervical cancer Maternal Aunt 64       daughter w/ cervical cancer as well  . Melanoma Maternal Uncle        other uncles with BCC/SCC/Melanoma     Current Outpatient Medications:  .  albuterol (VENTOLIN HFA) 108 (90 Base) MCG/ACT inhaler, Inhale into the lungs every 6 (six) hours as needed., Disp: , Rfl:  .  buPROPion (WELLBUTRIN SR) 150 MG 12 hr tablet, Take 150 mg by mouth 2 (two) times daily., Disp: , Rfl:  .  Calcium Carbonate-Vitamin D3 (CALCIUM 600/VITAMIN D) 600-400 MG-UNIT TABS, Take 2 tablets by mouth daily., Disp: 180 tablet, Rfl: 0 .  cyanocobalamin (,VITAMIN B-12,) 1000 MCG/ML injection, Inject 1,000 mcg into the skin every 30 (thirty) days., Disp: , Rfl:  .  cyclobenzaprine (FLEXERIL) 5 MG tablet, Take 5 mg by  mouth 3 (three) times daily., Disp: , Rfl:  .  escitalopram (LEXAPRO) 20 MG tablet, Take 20 mg by mouth daily., Disp: , Rfl:  .  esomeprazole (NEXIUM) 40 MG capsule, Take 40 mg by mouth daily at 12 noon., Disp: , Rfl:  .  folic acid (FOLVITE) 1 MG tablet, Take 1 mg by mouth daily., Disp: , Rfl:  .  ibuprofen (ADVIL) 800 MG tablet, Take 800 mg by mouth every 8 (eight) hours as needed., Disp: , Rfl:  .  letrozole (FEMARA) 2.5 MG tablet, Take 2.5 mg by mouth daily., Disp: , Rfl:  .  loratadine (CLARITIN) 10 MG tablet, Take 10 mg by mouth daily., Disp: , Rfl:  .  LORazepam (ATIVAN) 1 MG tablet, Take 1 mg by mouth 3 (three) times daily., Disp: , Rfl:  .  Magnesium 400 MG TABS, Take 400 mg by mouth 2 (two) times daily., Disp: , Rfl:  .  meloxicam (MOBIC) 7.5 MG tablet, Take 7.5 mg by mouth 2 (two) times a day., Disp: , Rfl:  .  methotrexate (RHEUMATREX) 2.5 MG tablet, Take 2.5 mg by mouth once a week. Take 8 tablets once a week, Disp: , Rfl:  .  metoprolol succinate (TOPROL-XL) 25 MG 24 hr tablet, Take 11/2 tablets by mouth daily., Disp: , Rfl:  .  nortriptyline (PAMELOR)  10 MG capsule, Take 30 mg by mouth at bedtime. , Disp: , Rfl:  .  phentermine (ADIPEX-P) 37.5 MG tablet, Take by mouth., Disp: , Rfl:  .  pregabalin (LYRICA) 300 MG capsule, Take 1 capsule (300 mg total) by mouth daily., Disp: 30 capsule, Rfl: 3 .  promethazine (PHENERGAN) 25 MG tablet, Take 1 tablet (25 mg total) by mouth every 8 (eight) hours as needed for nausea or vomiting., Disp: 90 tablet, Rfl: 0 .  pyridOXINE (VITAMIN B-6) 100 MG tablet, Take 100 mg by mouth daily., Disp: , Rfl:  .  topiramate (TOPAMAX) 25 MG tablet, Take by mouth., Disp: , Rfl:  .  vitamin C (ASCORBIC ACID) 500 MG tablet, Take 500 mg by mouth daily., Disp: , Rfl:  .  Investigational letrozole/placebo tablet NSABP B-42, Take 1 tablet by mouth daily., Disp: , Rfl:  No current facility-administered medications for this visit.  Facility-Administered Medications Ordered in Other Visits:  .  heparin lock flush 100 unit/mL, 500 Units, Intravenous, Once, Earlie Server, MD .  sodium chloride flush (NS) 0.9 % injection 10 mL, 10 mL, Intravenous, PRN, Earlie Server, MD, 10 mL at 11/19/18 1249 .  sodium chloride flush (NS) 0.9 % injection 10 mL, 10 mL, Intravenous, Once, Earlie Server, MD .  sodium chloride flush (NS) 0.9 % injection 10 mL, 10 mL, Intravenous, Once, Earlie Server, MD   Physical exam:  Vitals:   01/10/20 1341  BP: 113/76  Pulse: 89  Resp: 18  Temp: (!) 96.5 F (35.8 C)  Weight: 194 lb (88 kg)     Hematology/Oncology Follow Up Note [facility] [phone] Fax:([phone]  Patient Care Team: Marguerita Merles, MD as PCP - General (Family Medicine) End, Harrell Gave, MD as PCP - Cardiology (Cardiology) Rico Junker, RN as Oncology Nurse Navigator Earlie Server, MD as Consulting Physician (Oncology) Bary Castilla, Forest Gleason, MD (General Surgery) Noreene Filbert, MD as Referring Physician (Radiation Oncology)   Name of the patient: Theresa Chaney  [id number]  [birth year]   Date of visit:  01/11/20 REASON FOR VISIT Follow up for Assessment prior to chemotherapy treatment of breast cancer  Oncology  History 10/11/2017 Diagnosed with cT26mcN0  Neoadjuvant dose dense AC and 1 cycle of Taxol.  Interim image showed no treatment response. 03/19/2018 right mastectomy and right axillary dissection, bilateral salpingo-oophorectomy. ypT3 ypN2 #Negative genetic testing #06/11/2018 Finish adjuvant weekly Taxol x11 Grade 2 neuropathy  #right immediate breast reconstruction with placement of tissue expanders.   Chemotherapy-induced neuropathy, patient  Lyrica 300 mg daily.   She was taking Cymbalta which did not improve her symptoms.  Also reports that the Cymbalta makes patient feel nauseous. So she stopped the Cymbalta and ask for other medications to help with her depression.  She used to be on Lexapro which works fine for her.  Patient was restarted on Prozac 20 mg daily.   Cancer TREATMENT Neoadjuvant ddAC +one dose of Taxol, due to lack of response, surgery was offered. Case was discussed on breast tumor board. 03/19/2018 S/p right mastectomy and right axillary dissection, immediate breast reconstruction with placement of expanders. Also had elective bilateral salpingo-oophorectomy..  # s/p 11 cycles Taxol adjuvantly. Tolerated well.  # She has obtained dental clearance for starting Zometa.  S/p Zometa on 6/3/ 2019 # s/p adjuvant radiation, finished 10/10/2018 #06/03/2019 underwent elective left prophylactic mastectomy and sentinel lymph node biopsy of left axilla.  # 06/03/2019 underwent elective left prophylactic mastectomy and sentinel lymph node biopsy of left axilla. Pathology negative for malignancy. Status post Mediport removal on 06/03/2019. She also underwent right implant removal on 06/03/2019.    INTERVAL HISTORY 39yo female with above oncology history reviewed by me presents for follow-up of management of breast cancer.  Patient has completed right breast reconstruction on  07/20/2018 and s/p adjuvant radiation, finished 10/10/2018.  ##Patient followed up with Dr.Byrnett for post op evaluation.  Note reviewed.  #Chemotherapy-induced neuropathy, currently on Lyrica 300 mg daily, nortriptyline she follows up with neurology  #Takes letrozole 2.5 mg daily.  She has chronic hot flashes, intermittently. Continue to have chronic joint pains. Bilateral upper extremity swelling as improved .  She follows up with lymphedema clinic. Appetite has been fair.  She has gained 4 pounds since last visit.  She has intermittent nausea in the morning.  Requests refill of Phenergan.  Patient reports feeling more shortness of breath with exertion.  No exacerbating or alleviating factors.  Review of Systems  Constitutional: Negative for chills, fever, malaise/fatigue and weight loss.  HENT:   Negative for sore throat.   Eyes: Negative for redness.  Respiratory: Positive for shortness of breath. Negative for cough and wheezing.   Cardiovascular: Negative for chest pain, leg swelling and palpitations.  Gastrointestinal: Negative for abdominal pain, blood in stool, nausea and vomiting.  Genitourinary: Negative for dysuria.   Musculoskeletal: Positive for joint pain. Negative for myalgias.  Skin: Negative for rash.  Neurological: Positive for tingling. Negative for dizziness and tremors.  Hematological: Does not bruise/bleed easily.  Psychiatric/Behavioral: Negative for hallucinations.    No Known Allergies  Patient Active Problem List   Diagnosis Date Noted  . Fracture of neck of metacarpal bone 05/14/2019  . Chronic fatigue 04/09/2019  . Polyarthralgia 04/09/2019  . Status post right breast reconstruction 02/26/2019  . Status post right mastectomy 02/26/2019  . Mastalgia 02/15/2019  . Shortness of breath 08/23/2018  . Nonischemic cardiomyopathy ([place] 08/23/2018  . Preprocedural cardiovascular examination 08/23/2018  . Tachycardia 08/23/2018  . Palpitations 08/23/2018   . Estrogen receptor positive status (ER+) 04/04/2018  . Acquired absence of right breast and nipple 04/03/2018  . Breast cancer of upper-outer quadrant of  right female breast ([place]) 03/19/2018  . Family history of cancer   . Malignant neoplasm of overlapping sites of right breast in female, estrogen receptor positive ([place]) 10/19/2017  . Gastroesophageal reflux disease without esophagitis 02/24/2017  . Generalized anxiety disorder 10/03/2014  . Headache 10/03/2014     Past Medical History:  Diagnosis Date  . Anemia   . BRCA negative 11/26/2017  . Breast cancer ([place]) 10/11/2017   Multifocal, ER positive, PR negative, HER-2 negative. ypT3 ypN2a 8.7 cm, 4/15 nodes  . Cardiomyopathy ([place])    a. 10/2017 Echo: EF 60-65%, no rwma, Gr1 DD, nl RV size/fxn; b. 04/2018 Echo: EF 55-60%, no rwma, Nl RV size/fxn; c. 08/2018 Echo: EF 45%, diff HK, ? HK of antsept wall. Gr1 DD. Mild MR. Mild LAE/RAE. Mod dil RV.   [name] Chronic bronchitis ([place]) 11/2017  . COPD (chronic obstructive pulmonary disease) ([place])    MILD PER CXR  . Depression   . Family history of cancer   . GERD (gastroesophageal reflux disease)   . Headache    MIGRAINES  . Heart murmur    ASYMPTOMATIC  . Personal history of chemotherapy    current for right breast ca     Past Surgical History:  Procedure Laterality Date  . AXILLARY LYMPH NODE DISSECTION Right 03/19/2018   Procedure: AXILLARY LYMPH NODE DISSECTION;  Surgeon: Robert Bellow, MD;  Location: [facility];  Service: General;  Laterality: Right;  . BREAST BIOPSY Right 10/11/2017   12:30 posterior coil clip invasive mammary carcinoma  . BREAST BIOPSY Right 10/11/2017   11:30 middle depth ribbon clip DCIS  . BREAST BIOPSY Right 10/11/2017   5:30 anterior depth x shape invasive ductal carcinoma  . BREAST IMPLANT REMOVAL Right 06/03/2019   Procedure: REMOVAL OF RIGHT BREAST IMPLANTS;  Surgeon: [place] Going, DO;  Location: [facility];  Service: Plastics;  Laterality: Right;   . BREAST RECONSTRUCTION WITH PLACEMENT OF TISSUE EXPANDER AND FLEX HD (ACELLULAR HYDRATED DERMIS) Right 03/19/2018   Procedure: BREAST RECONSTRUCTION WITH PLACEMENT OF TISSUE EXPANDER AND FLEX HD (ACELLULAR HYDRATED DERMIS);  Surgeon: [place] Going, DO;  Location: [facility];  Service: Plastics;  Laterality: Right;  . LAPAROSCOPIC BILATERAL SALPINGO OOPHERECTOMY Bilateral 03/19/2018   Procedure: LAPAROSCOPIC BILATERAL SALPINGO OOPHORECTOMY;  Surgeon: Benjaman Kindler, MD;  Location: [facility];  Service: Gynecology;  Laterality: Bilateral;  . MASTECTOMY Right 03/2018  . MASTECTOMY W/ SENTINEL NODE BIOPSY Right 03/19/2018   Procedure: MASTECTOMY WITH SENTINEL LYMPH NODE BIOPSY;  Surgeon: Robert Bellow, MD;  Location: [facility];  Service: General;  Laterality: Right;  . PORT-A-CATH REMOVAL Left 06/03/2019   Procedure: REMOVAL PORT-A-CATH;  Surgeon: Robert Bellow, MD;  Location: [facility];  Service: General;  Laterality: Left;  . PORTACATH PLACEMENT Left 10/24/2017   Procedure: INSERTION PORT-A-CATH;  Surgeon: Robert Bellow, MD;  Location: [facility];  Service: General;  Laterality: Left;  . REMOVAL OF TISSUE EXPANDER AND PLACEMENT OF IMPLANT Right 07/20/2018   Procedure: REMOVAL OF RIGHT BREAST TISSUE EXPANDER AND PLACEMENT OF IMPLANT;  Surgeon: [place] Going, DO;  Location: [facility];  Service: Plastics;  Laterality: Right;  . SIMPLE MASTECTOMY WITH AXILLARY SENTINEL NODE BIOPSY Left 06/03/2019   Procedure: SIMPLE MASTECTOMY LEFT;  Surgeon: Robert Bellow, MD;  Location: [facility];  Service: General;  Laterality: Left;    Social History   Socioeconomic History  . Marital status: Married    Spouse name: Not on file  . Number of children: Not  on file  . Years of education: Not on file  . Highest education level: Not on file  Occupational History  . Occupation: Occupational psychologist    Comment: Therapist, art   Tobacco Use  . Smoking status:  Current Every Day Smoker    Packs/day: 0.50    Years: 18.00    Pack years: 9.00    Types: Cigarettes    Start date: 06/21/2018  . Smokeless tobacco: Never Used  Substance and Sexual Activity  . Alcohol use: No  . Drug use: No  . Sexual activity: Yes    Birth control/protection: Injection  Other Topics Concern  . Not on file  Social History Narrative  . Not on file   Social Determinants of Health   Financial Resource Strain:   . Difficulty of Paying Living Expenses: Not on file  Food Insecurity:   . Worried About Charity fundraiser in the Last Year: Not on file  . Ran Out of Food in the Last Year: Not on file  Transportation Needs:   . Lack of Transportation (Medical): Not on file  . Lack of Transportation (Non-Medical): Not on file  Physical Activity:   . Days of Exercise per Week: Not on file  . Minutes of Exercise per Session: Not on file  Stress:   . Feeling of Stress : Not on file  Social Connections:   . Frequency of Communication with Friends and Family: Not on file  . Frequency of Social Gatherings with Friends and Family: Not on file  . Attends Religious Services: Not on file  . Active Member of Clubs or Organizations: Not on file  . Attends Archivist Meetings: Not on file  . Marital Status: Not on file  Intimate Partner Violence:   . Fear of Current or Ex-Partner: Not on file  . Emotionally Abused: Not on file  . Physically Abused: Not on file  . Sexually Abused: Not on file     Family History  Problem Relation Age of Onset  . Melanoma Maternal Aunt        other aunts with BCC/SCC/Melanoma  . Diabetes Father   . Hypertension Father   . Hyperlipidemia Father   . Heart attack Father 88       "mild"  . Bladder Cancer Maternal Grandmother   . Cervical cancer Maternal Aunt 64       daughter w/ cervical cancer as well  . Melanoma Maternal Uncle        other uncles with BCC/SCC/Melanoma     Current Outpatient Medications:  .  albuterol  (VENTOLIN HFA) 108 (90 Base) MCG/ACT inhaler, Inhale into the lungs every 6 (six) hours as needed., Disp: , Rfl:  .  buPROPion (WELLBUTRIN SR) 150 MG 12 hr tablet, Take 150 mg by mouth 2 (two) times daily., Disp: , Rfl:  .  Calcium Carbonate-Vitamin D3 (CALCIUM 600/VITAMIN D) 600-400 MG-UNIT TABS, Take 2 tablets by mouth daily., Disp: 180 tablet, Rfl: 0 .  cyanocobalamin (,VITAMIN B-12,) 1000 MCG/ML injection, Inject 1,000 mcg into the skin every 30 (thirty) days., Disp: , Rfl:  .  cyclobenzaprine (FLEXERIL) 5 MG tablet, Take 5 mg by mouth 3 (three) times daily., Disp: , Rfl:  .  escitalopram (LEXAPRO) 20 MG tablet, Take 20 mg by mouth daily., Disp: , Rfl:  .  esomeprazole (NEXIUM) 40 MG capsule, Take 40 mg by mouth daily at 12 noon., Disp: , Rfl:  .  folic acid (FOLVITE) 1 MG  tablet, Take 1 mg by mouth daily., Disp: , Rfl:  .  ibuprofen (ADVIL) 800 MG tablet, Take 800 mg by mouth every 8 (eight) hours as needed., Disp: , Rfl:  .  letrozole (FEMARA) 2.5 MG tablet, Take 2.5 mg by mouth daily., Disp: , Rfl:  .  loratadine (CLARITIN) 10 MG tablet, Take 10 mg by mouth daily., Disp: , Rfl:  .  LORazepam (ATIVAN) 1 MG tablet, Take 1 mg by mouth 3 (three) times daily., Disp: , Rfl:  .  Magnesium 400 MG TABS, Take 400 mg by mouth 2 (two) times daily., Disp: , Rfl:  .  meloxicam (MOBIC) 7.5 MG tablet, Take 7.5 mg by mouth 2 (two) times a day., Disp: , Rfl:  .  methotrexate (RHEUMATREX) 2.5 MG tablet, Take 2.5 mg by mouth once a week. Take 8 tablets once a week, Disp: , Rfl:  .  metoprolol succinate (TOPROL-XL) 25 MG 24 hr tablet, Take 11/2 tablets by mouth daily., Disp: , Rfl:  .  nortriptyline (PAMELOR) 10 MG capsule, Take 30 mg by mouth at bedtime. , Disp: , Rfl:  .  phentermine (ADIPEX-P) 37.5 MG tablet, Take by mouth., Disp: , Rfl:  .  pregabalin (LYRICA) 300 MG capsule, Take 1 capsule (300 mg total) by mouth daily., Disp: 30 capsule, Rfl: 3 .  promethazine (PHENERGAN) 25 MG tablet, Take 1 tablet (25  mg total) by mouth every 8 (eight) hours as needed for nausea or vomiting., Disp: 90 tablet, Rfl: 0 .  pyridOXINE (VITAMIN B-6) 100 MG tablet, Take 100 mg by mouth daily., Disp: , Rfl:  .  topiramate (TOPAMAX) 25 MG tablet, Take by mouth., Disp: , Rfl:  .  vitamin C (ASCORBIC ACID) 500 MG tablet, Take 500 mg by mouth daily., Disp: , Rfl:  .  Investigational letrozole/placebo tablet NSABP B-42, Take 1 tablet by mouth daily., Disp: , Rfl:  No current facility-administered medications for this visit.  Facility-Administered Medications Ordered in Other Visits:  .  heparin lock flush 100 unit/mL, 500 Units, Intravenous, Once, Earlie Server, MD .  sodium chloride flush (NS) 0.9 % injection 10 mL, 10 mL, Intravenous, PRN, Earlie Server, MD, 10 mL at 11/19/18 1249 .  sodium chloride flush (NS) 0.9 % injection 10 mL, 10 mL, Intravenous, Once, Earlie Server, MD .  sodium chloride flush (NS) 0.9 % injection 10 mL, 10 mL, Intravenous, Once, Earlie Server, MD   Physical exam:  Vitals:   01/10/20 1341  BP: 113/76  Pulse: 89  Resp: 18  Temp: (!) 96.5 F (35.8 C)  Weight: 194 lb (88 kg)  ECOG 1 .physic  CBC Latest Ref Rng & Units 01/10/2020  WBC 4.0 - 10.5 K/uL 8.5  Hemoglobin 12.0 - 15.0 g/dL 12.0  Hematocrit 36.0 - 46.0 % 37.0  Platelets 150 - 400 K/uL 298   RADIOGRAPHIC STUDIES: I have personally reviewed the radiological images as listed and agreed with the findings in the report. 08/23/2018 DEXA The BMD measured at Femur Neck Right is 0.900 g/cm2 with a T-score of -1.0. This patient is considered normal according to [facility]) criteria. Site Region Measured Measured WHO Young Adult BMD Date       Age      Classification T-score AP Spine L1-L4 08/23/2018 35.5 Normal -0.8 1.091 g/cm2 DualFemur Neck Right 08/23/2018 35.5 Normal -1.0 0.900 g/cm2  08/15/2018 2D echo Study Conclusions - Left ventricle: The cavity size was mildly dilated. Systolic   function was mildly reduced. The  estimated ejection fraction was   45%. Diffuse hypokinesis. Hypokinesis of the anteroseptal   myocardium. Doppler parameters are consistent with abnormal left   ventricular relaxation (grade 1 diastolic dysfunction). - Aortic valve: Valve area (VTI): 1.88 cm^2. Valve area (Vmax): 1.8   cm^2. Valve area (Vmean): 1.74 cm^2. - Mitral valve: There was mild regurgitation. Valve area by   continuity equation (using LVOT flow): 2.22 cm^2. - Left atrium: The atrium was mildly dilated. - Right ventricle: The cavity size was moderately dilated. - Right atrium: The atrium was mildly dilated.  RADIOGRAPHIC STUDIES: I have personally reviewed the radiological images as listed and agreed with the findings in the report. 10/30/2018 Bone scan whole body Non scintigraphic evidence of osseous metastatic disease.  Assessment and plan- Patient is a 40 y.o. female presents for evaluation of newly diagnosed multicentric right breast cancer.  Cancer Staging Breast cancer of upper-outer quadrant of right female breast [facility]) Staging form: Breast, AJCC 8th Edition - Clinical: G2, ER+, PR+, HER2- - Signed by Earlie Server, MD on 04/05/2018 - Pathologic stage from 04/04/2018: No Stage Recommended (ypT3, pN2, cM0, G3, ER+, PR-, HER2-) - Signed by Earlie Server, MD on 04/04/2018    1. Malignant neoplasm of upper-outer quadrant of right breast in female, estrogen receptor positive ([place])   2. Shortness of breath    # TNM Stage IIIA right breast cancer, ER/ PR positive, HER-2 negative status post bilateral oophorectomy, Right mastectomy and right axillary lymph node dissection, status post implant, and implant removal.  Status post left mastectomy and axillary sentinel lymph node biopsy. Status post port removal.  She is clinically doing well. Labs reviewed and discussed with patient. Continue letrozole.  Plan for total of 10 years.  Discussed with patient. She has manageable hot flashes.  Intermittent nausea, Phenergan  prescription was refilled. #Adjuvant bisphosphonate:  Zometa on 07/08/2019.  Due for next dose of Zometa at next visit in 3 months. Recommend patient to take calcium and vitamin supplementations.  #Insomnia, lifestyle modification discussed with patient. #Chemotherapy-induced neuropathy, continue Lyrica and nortriptyline.  Symptoms are stable.  Follow-up with neurology.. . #Depression.  Continue Lexapro 20 mg daily. #Mediport has been removed. #Lung nodule, small pulmonary nodules attention on follow-up studies.  Plan repeat CT chest, discuss at next visit.  Follow-up in 3 months.  Earlie Server, MD, PhD Hematology Oncology [place] at [facility]  01/11/20 ECOG 1 Physical Exam  Constitutional: She is oriented to person, place, and time. No distress.  HENT:  Head: Normocephalic and atraumatic.  Mouth/Throat: No oropharyngeal exudate.  Eyes: Pupils are equal, round, and reactive to light. Conjunctivae and EOM are normal. Left eye exhibits no discharge. No scleral icterus.  Neck: No JVD present.  Cardiovascular: Normal rate, regular rhythm and normal heart sounds.  No murmur heard. Pulmonary/Chest: Effort normal and breath sounds normal. No respiratory distress. She exhibits no tenderness.  Abdominal: Soft. Bowel sounds are normal. She exhibits no distension. There is no abdominal tenderness.  Musculoskeletal:        General: No edema. Normal range of motion.     Cervical back: Normal range of motion and neck supple.  Lymphadenopathy:    She has no cervical adenopathy.  Neurological: She is alert and oriented to person, place, and time. Coordination normal.  Skin: Skin is warm and dry. No rash noted. She is not diaphoretic. No erythema.  Psychiatric: Affect normal.        CMP Latest Ref Rng & Units 01/10/2020  Glucose 70 - 99 mg/dL 91  BUN 6 - 20 mg/dL 14  Creatinine 0.44 - 1.00 mg/dL 0.80  Sodium 135 - 145 mmol/L 137  Potassium 3.5 - 5.1 mmol/L 3.5   Chloride 98 - 111 mmol/L 106  CO2 22 - 32 mmol/L 24  Calcium 8.9 - 10.3 mg/dL 9.3  Total Protein 6.5 - 8.1 g/dL 7.7  Total Bilirubin 0.3 - 1.2 mg/dL 0.4  Alkaline Phos 38 - 126 U/L 94  AST 15 - 41 U/L 23  ALT 0 - 44 U/L 18   CBC Latest Ref Rng & Units 01/10/2020  WBC 4.0 - 10.5 K/uL 8.5  Hemoglobin 12.0 - 15.0 g/dL 12.0  Hematocrit 36.0 - 46.0 % 37.0  Platelets 150 - 400 K/uL 298   RADIOGRAPHIC STUDIES: I have personally reviewed the radiological images as listed and agreed with the findings in the report. 08/23/2018 DEXA The BMD measured at Femur Neck Right is 0.900 g/cm2 with a T-score of -1.0. This patient is considered normal according to [facility]) criteria. Site Region Measured Measured WHO Young Adult BMD Date       Age      Classification T-score AP Spine L1-L4 08/23/2018 35.5 Normal -0.8 1.091 g/cm2 DualFemur Neck Right 08/23/2018 35.5 Normal -1.0 0.900 g/cm2  08/15/2018 2D echo Study Conclusions - Left ventricle: The cavity size was mildly dilated. Systolic   function was mildly reduced. The estimated ejection fraction was   45%. Diffuse hypokinesis. Hypokinesis of the anteroseptal   myocardium. Doppler parameters are consistent with abnormal left   ventricular relaxation (grade 1 diastolic dysfunction). - Aortic valve: Valve area (VTI): 1.88 cm^2. Valve area (Vmax): 1.8   cm^2. Valve area (Vmean): 1.74 cm^2. - Mitral valve: There was mild regurgitation. Valve area by   continuity equation (using LVOT flow): 2.22 cm^2. - Left atrium: The atrium was mildly dilated. - Right ventricle: The cavity size was moderately dilated. - Right atrium: The atrium was mildly dilated.  RADIOGRAPHIC STUDIES: I have personally reviewed the radiological images as listed and agreed with the findings in the report. 10/30/2018 Bone scan whole body Non scintigraphic evidence of osseous metastatic disease.  Assessment and plan- Patient is a 39 y.o. female presents  for evaluation of newly diagnosed multicentric right breast cancer.  Cancer Staging Breast cancer of upper-outer quadrant of right female breast [facility]) Staging form: Breast, AJCC 8th Edition - Clinical: G2, ER+, PR+, HER2- - Signed by Earlie Server, MD on 04/05/2018 - Pathologic stage from 04/04/2018: No Stage Recommended (ypT3, pN2, cM0, G3, ER+, PR-, HER2-) - Signed by Earlie Server, MD on 04/04/2018    1. Malignant neoplasm of upper-outer quadrant of right breast in female, estrogen receptor positive ([place])   2. Shortness of breath   3. Neuropathy due to chemotherapeutic drug ([place])   4. Aromatase inhibitor use    # TNM Stage IIIA right breast cancer, ER/ PR positive, HER-2 negative status post bilateral oophorectomy, Right mastectomy and right axillary lymph node dissection, status post implant, and implant removal.  Status post left mastectomy and axillary sentinel lymph node biopsy. Status post medi port removal. Labs are reviewed and discussed with patient.  Continue letrozole.  Plan for total of 10 years.  Discussed with patient. Patient will receive adjuvant Zometa every 6 months for 3 to 5 years..  Proceed with Zometa today.  #Shortness of breath with exertion. Repeat 2D echo.  #Lung nodules on previous CT. I will repeat CT chest without contrast.  #Chemotherapy-induced  neuropathy, continue Lyrica and nortriptyline.  Symptoms are stable.  Follow-up with neurology.. . #Depression.  Continue Lexapro 20 mg daily. #Mediport has been removed.  Follow-up in 3 months.  Earlie Server, MD, PhD Hematology Oncology [place] at [facility]  01/11/20

## 2020-01-13 ENCOUNTER — Other Ambulatory Visit: Payer: Self-pay | Admitting: Oncology

## 2020-01-13 MED ORDER — VITAMIN D 25 MCG (1000 UNIT) PO TABS: 1000.0000 [IU] | ORAL_TABLET | Freq: Every day | ORAL | 0 refills | Status: DC

## 2020-01-24 ENCOUNTER — Other Ambulatory Visit: Payer: Self-pay

## 2020-01-24 ENCOUNTER — Ambulatory Visit
Admission: RE | Admit: 2020-01-24 | Discharge: 2020-01-24 | Disposition: A | Discharge status: Home / Self Care | Payer: Managed Care, Other (non HMO) | Source: Ambulatory Visit | Attending: Oncology | Admitting: Oncology

## 2020-01-24 DIAGNOSIS — R0602 Shortness of breath: Secondary | ICD-10-CM | POA: Diagnosis present

## 2020-01-24 IMAGING — CT CT CHEST W/O CM
2 of 3 series · 15 of 36 positions shown, 18 images · non-contrast
Comparison: [DATE]

CLINICAL DATA: Shortness of breath. History of breast cancer. Lung
nodule follow-up.

EXAM:
CT CHEST WITHOUT CONTRAST
TECHNIQUE: Multidetector CT imaging of the chest was performed following the
standard protocol without IV contrast.

[Series 2: thorax · axial · 0.64mm/px · z∈[-331,-63]mm · 12 of 158 slices shown, 15 images]
[im 12/158  mediastinal]
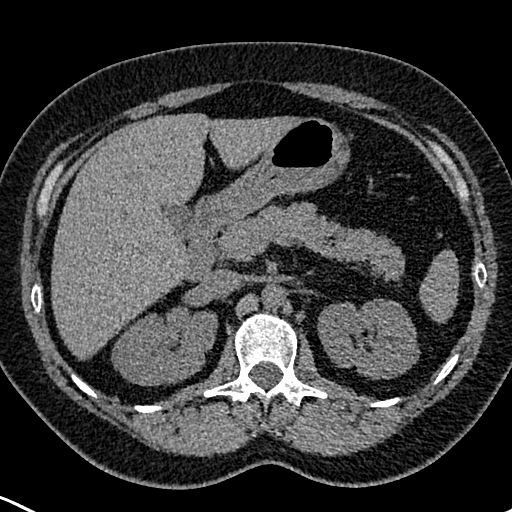
[im 12/158  lung]
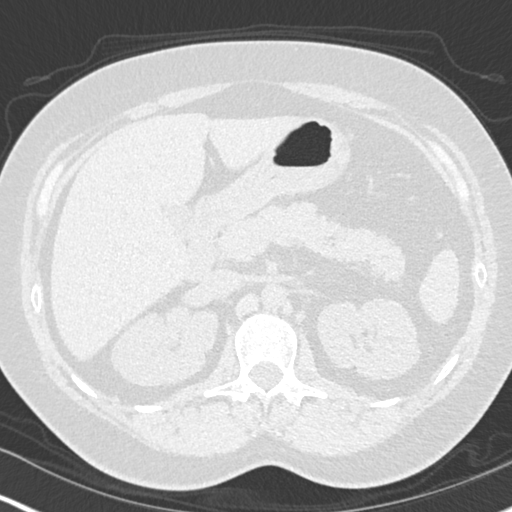
[im 24/158  lung]
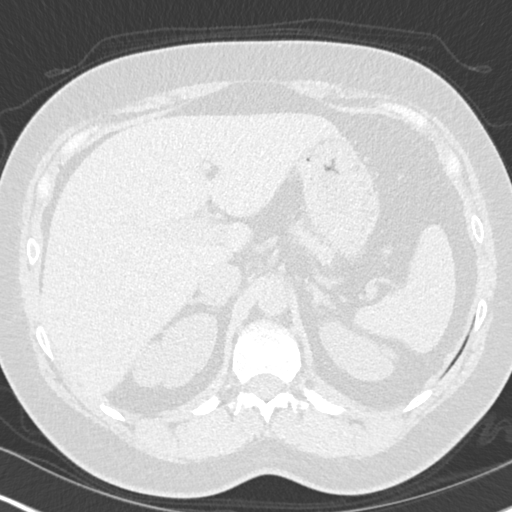
[im 35/158  lung]
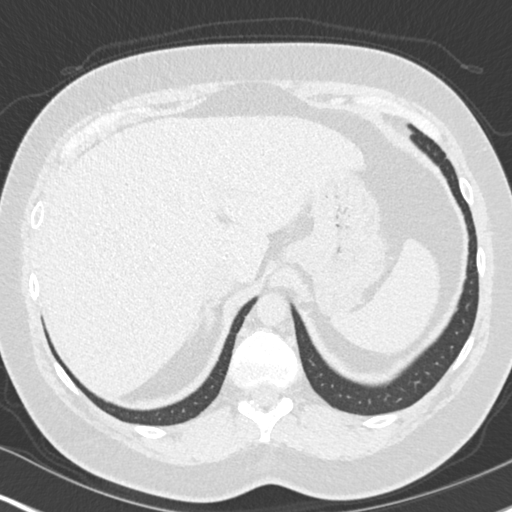
[im 47/158  lung]
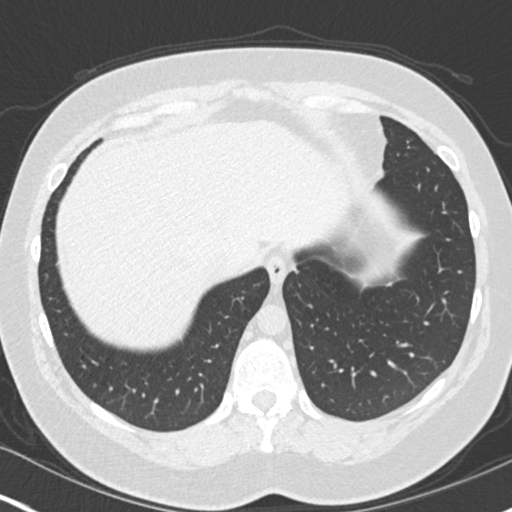
[im 59/158  mediastinal]
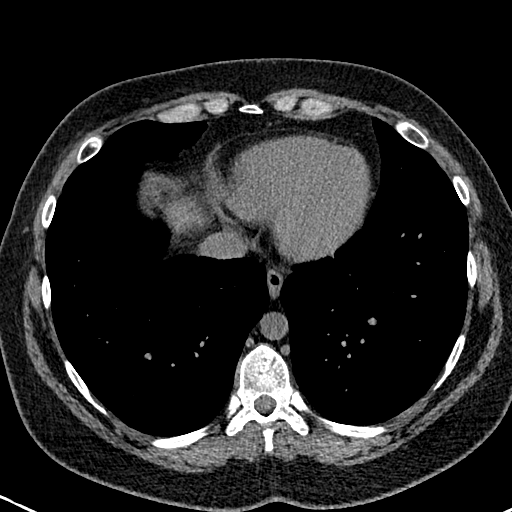
[im 59/158  lung]
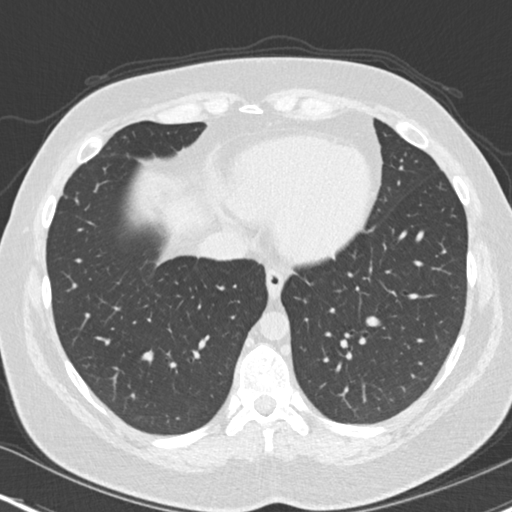
[im 70/158  lung]
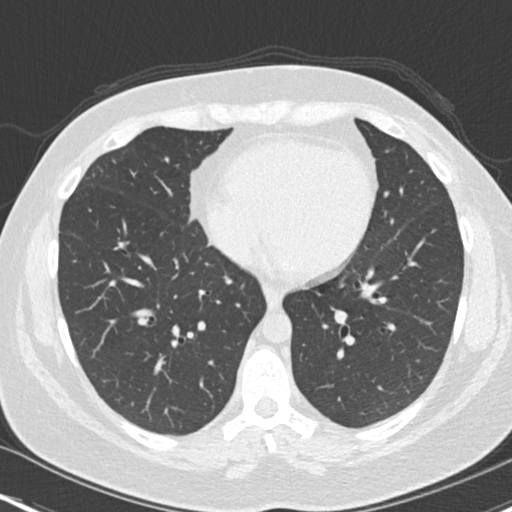
[im 88/158  lung]
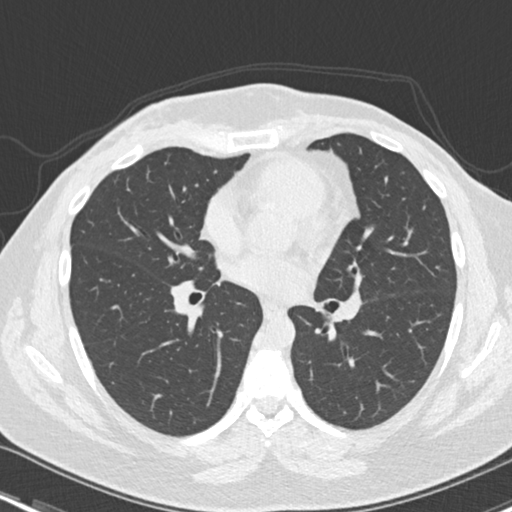
[im 99/158  lung]
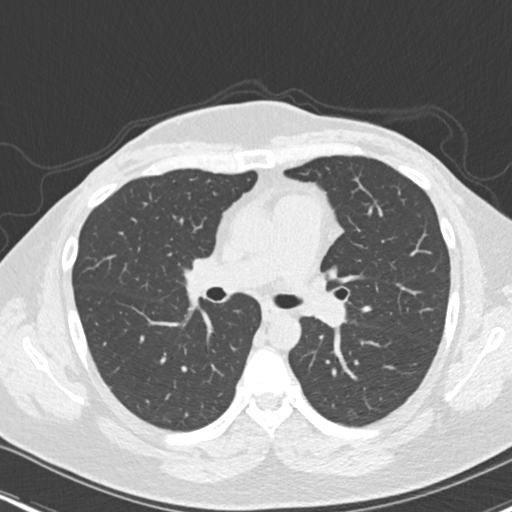
[im 111/158  mediastinal]
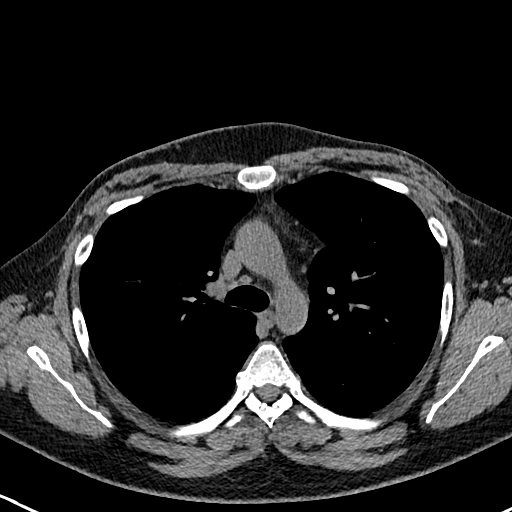
[im 111/158  lung]
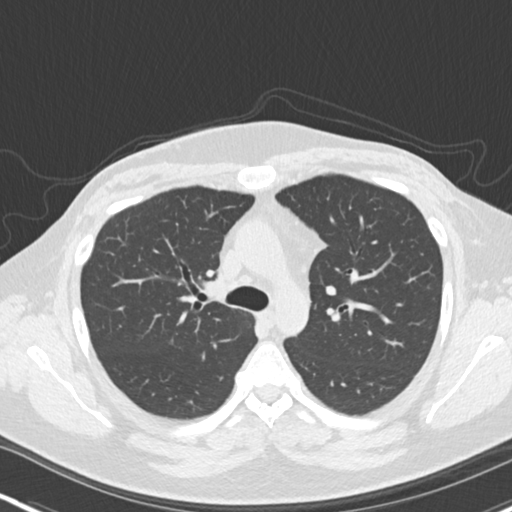
[im 123/158  lung]
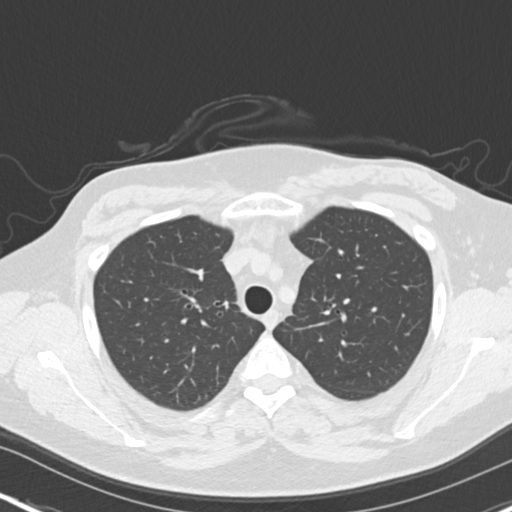
[im 134/158  lung]
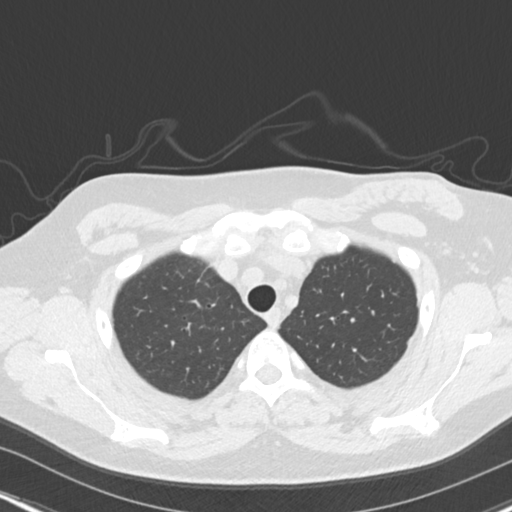
[im 146/158  lung]
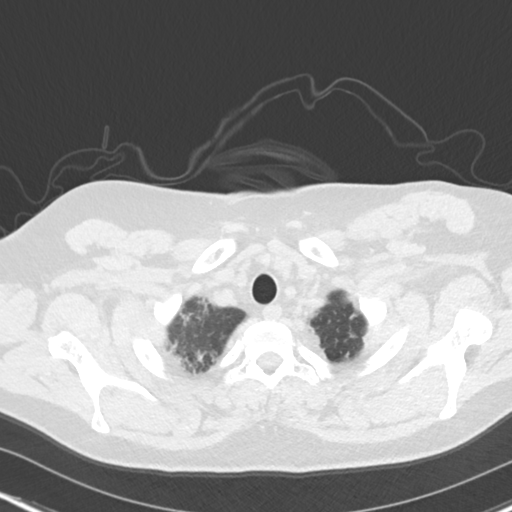

[Series 5: coronal · coronal · 0.63mm/px · 3 of 148 slices shown]
[im 30/148  lung]
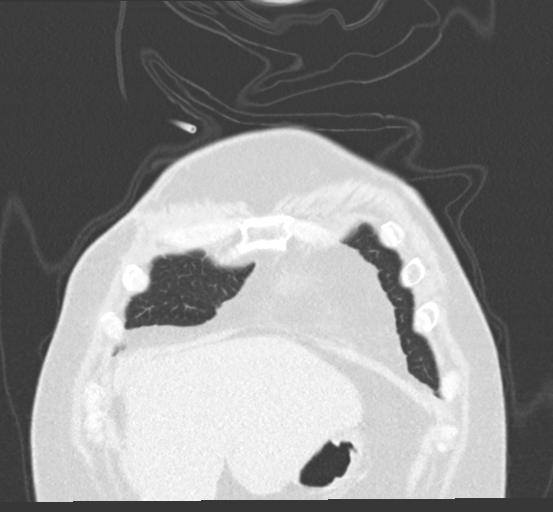
[im 59/148  lung]
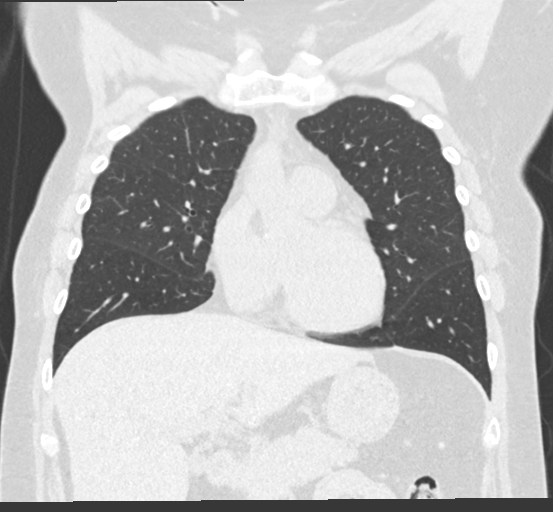
[im 89/148  lung]
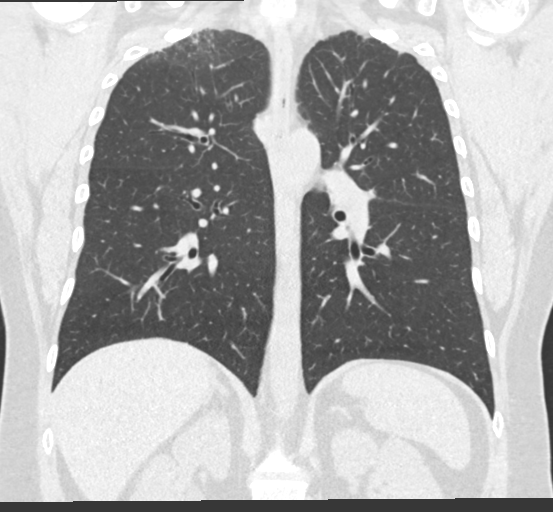

[15 of 36 positions shown; findings below may reference images not displayed]

FINDINGS: Cardiovascular: No significant vascular findings. Normal heart size.
No pericardial effusion.

Mediastinum/Nodes: No enlarged mediastinal or axillary lymph nodes.
Thyroid gland, trachea, and esophagus demonstrate no significant
findings.

Lungs/Pleura: No pleural effusion. Asymmetric ground-glass
attenuation and interstitial reticulation in the right apex likely
reflects changes from external beam radiation. Calcified granuloma
noted in the anterolateral right lung base. Stable 3 mm left lower
lobe lung nodule, image 106/3. Unchanged 2 mm anterolateral left
upper lobe lung nodule, image [DATE]. 3 mm right middle lobe lung
nodule is unchanged, image 68/3. 0.6 x 0.4 cm posterior right upper
lobe lung nodule is identified, image 46/3. Not significantly
changed when compared with previous exam.

Upper Abdomen: No acute abnormality.

Musculoskeletal: No chest wall mass or suspicious bone lesions
identified. Status post bilateral mastectomy. Asymmetric soft tissue
density within the right chest wall is identified measuring 2.4 by
1.3, image 63/2.
IMPRESSION: 1. No acute cardiopulmonary abnormalities.
2. Small nonspecific pulmonary nodules are unchanged from previous
exam.
3. Status post bilateral mastectomy. Asymmetric soft tissue density
within the right chest wall is identified and may represent
postsurgical change. Correlation with patient's surgical history is
advised.
4. Asymmetric ground-glass attenuation and interstitial reticulation
in the right apex likely reflects changes from external beam
radiation.

## 2020-01-28 ENCOUNTER — Encounter: Payer: Self-pay | Admitting: Oncology

## 2020-01-29 ENCOUNTER — Other Ambulatory Visit: Payer: Self-pay

## 2020-01-29 ENCOUNTER — Ambulatory Visit
Admission: RE | Admit: 2020-01-29 | Discharge: 2020-01-29 | Disposition: A | Discharge status: Home / Self Care | Payer: Managed Care, Other (non HMO) | Source: Ambulatory Visit | Attending: Oncology | Admitting: Oncology

## 2020-01-29 DIAGNOSIS — Z170 Estrogen receptor positive status [ER+]: Secondary | ICD-10-CM | POA: Diagnosis not present

## 2020-01-29 DIAGNOSIS — J449 Chronic obstructive pulmonary disease, unspecified: Secondary | ICD-10-CM | POA: Diagnosis not present

## 2020-01-29 DIAGNOSIS — C50411 Malignant neoplasm of upper-outer quadrant of right female breast: Secondary | ICD-10-CM | POA: Insufficient documentation

## 2020-01-29 DIAGNOSIS — R0602 Shortness of breath: Secondary | ICD-10-CM

## 2020-01-29 NOTE — Progress Notes (Signed)
*  PRELIMINARY RESULTS* Echocardiogram 2D Echocardiogram has been performed.  Sherrie Sport 01/29/2020, 11:44 AM

## 2020-04-10 ENCOUNTER — Inpatient Hospital Stay: Payer: Medicare HMO | Attending: Oncology

## 2020-04-10 ENCOUNTER — Ambulatory Visit: Payer: Managed Care, Other (non HMO)

## 2020-04-10 ENCOUNTER — Encounter: Payer: Self-pay | Admitting: Oncology

## 2020-04-10 ENCOUNTER — Other Ambulatory Visit: Payer: Self-pay

## 2020-04-10 ENCOUNTER — Inpatient Hospital Stay (HOSPITAL_BASED_OUTPATIENT_CLINIC_OR_DEPARTMENT_OTHER): Payer: Medicare HMO | Admitting: Oncology

## 2020-04-10 VITALS — BP 109/70 | HR 89 | Temp 97.6°F | Resp 18 | Wt 191.6 lb

## 2020-04-10 DIAGNOSIS — C50411 Malignant neoplasm of upper-outer quadrant of right female breast: Secondary | ICD-10-CM | POA: Diagnosis present

## 2020-04-10 DIAGNOSIS — R112 Nausea with vomiting, unspecified: Secondary | ICD-10-CM | POA: Insufficient documentation

## 2020-04-10 DIAGNOSIS — M2550 Pain in unspecified joint: Secondary | ICD-10-CM | POA: Diagnosis not present

## 2020-04-10 DIAGNOSIS — Z905 Acquired absence of kidney: Secondary | ICD-10-CM | POA: Insufficient documentation

## 2020-04-10 DIAGNOSIS — Z780 Asymptomatic menopausal state: Secondary | ICD-10-CM | POA: Diagnosis not present

## 2020-04-10 DIAGNOSIS — Z90722 Acquired absence of ovaries, bilateral: Secondary | ICD-10-CM | POA: Diagnosis not present

## 2020-04-10 DIAGNOSIS — Z79811 Long term (current) use of aromatase inhibitors: Secondary | ICD-10-CM | POA: Diagnosis not present

## 2020-04-10 DIAGNOSIS — F329 Major depressive disorder, single episode, unspecified: Secondary | ICD-10-CM | POA: Insufficient documentation

## 2020-04-10 DIAGNOSIS — K219 Gastro-esophageal reflux disease without esophagitis: Secondary | ICD-10-CM | POA: Diagnosis not present

## 2020-04-10 DIAGNOSIS — Z8049 Family history of malignant neoplasm of other genital organs: Secondary | ICD-10-CM | POA: Diagnosis not present

## 2020-04-10 DIAGNOSIS — E875 Hyperkalemia: Secondary | ICD-10-CM | POA: Insufficient documentation

## 2020-04-10 DIAGNOSIS — F17210 Nicotine dependence, cigarettes, uncomplicated: Secondary | ICD-10-CM | POA: Diagnosis not present

## 2020-04-10 DIAGNOSIS — Z9011 Acquired absence of right breast and nipple: Secondary | ICD-10-CM | POA: Diagnosis not present

## 2020-04-10 DIAGNOSIS — Z808 Family history of malignant neoplasm of other organs or systems: Secondary | ICD-10-CM | POA: Diagnosis not present

## 2020-04-10 DIAGNOSIS — Z170 Estrogen receptor positive status [ER+]: Secondary | ICD-10-CM

## 2020-04-10 DIAGNOSIS — Z9079 Acquired absence of other genital organ(s): Secondary | ICD-10-CM | POA: Diagnosis not present

## 2020-04-10 DIAGNOSIS — T451X5A Adverse effect of antineoplastic and immunosuppressive drugs, initial encounter: Secondary | ICD-10-CM | POA: Insufficient documentation

## 2020-04-10 DIAGNOSIS — G620 Drug-induced polyneuropathy: Secondary | ICD-10-CM | POA: Insufficient documentation

## 2020-04-10 DIAGNOSIS — N951 Menopausal and female climacteric states: Secondary | ICD-10-CM | POA: Diagnosis not present

## 2020-04-10 DIAGNOSIS — R918 Other nonspecific abnormal finding of lung field: Secondary | ICD-10-CM | POA: Diagnosis not present

## 2020-04-10 LAB — COMPREHENSIVE METABOLIC PANEL
ALT: 23 U/L (ref 0–44)
AST: 30 U/L (ref 15–41)
Albumin: 4.2 g/dL (ref 3.5–5.0)
Alkaline Phosphatase: 80 U/L (ref 38–126)
Anion gap: 9 (ref 5–15)
BUN: 17 mg/dL (ref 6–20)
CO2: 25 mmol/L (ref 22–32)
Calcium: 8.8 mg/dL — ABNORMAL LOW (ref 8.9–10.3)
Chloride: 106 mmol/L (ref 98–111)
Creatinine, Ser: 0.85 mg/dL (ref 0.44–1.00)
GFR calc Af Amer: >60 mL/min (ref >60)
GFR calc non Af Amer: >60 mL/min (ref >60)
Glucose, Bld: 115 mg/dL — ABNORMAL HIGH (ref 70–99)
Potassium: 3.3 mmol/L — ABNORMAL LOW (ref 3.5–5.1)
Sodium: 140 mmol/L (ref 135–145)
Total Bilirubin: 0.6 mg/dL (ref 0.3–1.2)
Total Protein: 7.3 g/dL (ref 6.5–8.1)

## 2020-04-10 LAB — CBC WITH DIFFERENTIAL/PLATELET
Abs Immature Granulocytes: 0.01 10*3/uL (ref 0.00–0.07)
Basophils Absolute: 0 10*3/uL (ref 0.0–0.1)
Basophils Relative: 0 %
Eosinophils Absolute: 0.1 10*3/uL (ref 0.0–0.5)
Eosinophils Relative: 2 %
HCT: 34.2 % — ABNORMAL LOW (ref 36.0–46.0)
Hemoglobin: 11.3 g/dL — ABNORMAL LOW (ref 12.0–15.0)
Immature Granulocytes: 0 %
Lymphocytes Relative: 41 %
Lymphs Abs: 3.3 10*3/uL (ref 0.7–4.0)
MCH: 30.1 pg (ref 26.0–34.0)
MCHC: 33 g/dL (ref 30.0–36.0)
MCV: 91 fL (ref 80.0–100.0)
Monocytes Absolute: 0.6 10*3/uL (ref 0.1–1.0)
Monocytes Relative: 8 %
Neutro Abs: 3.9 10*3/uL (ref 1.7–7.7)
Neutrophils Relative %: 49 %
Platelets: 290 10*3/uL (ref 150–400)
RBC: 3.76 MIL/uL — ABNORMAL LOW (ref 3.87–5.11)
RDW: 13.8 % (ref 11.5–15.5)
Smear Review: ADEQUATE
WBC: 8 10*3/uL (ref 4.0–10.5)
nRBC: 0 % (ref 0.0–0.2)

## 2020-04-10 MED ORDER — PROMETHAZINE HCL 25 MG PO TABS: 25.0000 mg | ORAL_TABLET | Freq: Three times a day (TID) | ORAL | 0 refills | Status: DC | PRN

## 2020-04-10 NOTE — Progress Notes (Signed)
Patient reports feeling nauseas for the past week and requesting a refill on Promethazine.

## 2020-04-10 NOTE — Progress Notes (Signed)
Hematology/Oncology Follow Up Note [facility] [phone] [phone]  Patient Care Team: Marguerita Merles, MD as PCP - General (Family Medicine) End, Harrell Gave, MD as PCP - Cardiology (Cardiology) Rico Junker, RN as Oncology Nurse Navigator Earlie Server, MD as Consulting Physician (Oncology) Bary Castilla, Forest Gleason, MD (General Surgery) Noreene Filbert, MD as Referring Physician (Radiation Oncology)   Name of the patient: Theresa Chaney  [id number]  05-20-1981   Date of visit: 04/10/20 REASON FOR VISIT Follow up for Assessment prior to chemotherapy treatment of breast cancer  Oncology History 10/11/2017 Diagnosed with [id number]  Neoadjuvant dose dense AC and 1 cycle of Taxol.  Interim image showed no treatment response. 03/19/2018 right mastectomy and right axillary dissection, bilateral salpingo-oophorectomy. ypT3 ypN2 #Negative genetic testing #06/11/2018 Finish adjuvant weekly Taxol x11 Grade 2 neuropathy  #right immediate breast reconstruction with placement of tissue expanders.   Chemotherapy-induced neuropathy, patient  Lyrica 300 mg daily.   She was taking Cymbalta which did not improve her symptoms.  Also reports that the Cymbalta makes patient feel nauseous. So she stopped the Cymbalta and ask for other medications to help with her depression.  She used to be on Lexapro which works fine for her.  Patient was restarted on Prozac 20 mg daily.   Cancer TREATMENT Neoadjuvant ddAC +one dose of Taxol, due to lack of response, surgery was offered. Case was discussed on breast tumor board. 03/19/2018 S/p right mastectomy and right axillary dissection, immediate breast reconstruction with placement of expanders. Also had elective bilateral salpingo-oophorectomy..  # s/p 11 cycles Taxol adjuvantly. Tolerated well.  # She has obtained dental clearance for starting Zometa.  S/p Zometa on 6/3/ 2019 # s/p adjuvant right chest wall radiation, finished  10/10/2018 #06/03/2019 underwent elective left prophylactic mastectomy and sentinel lymph node biopsy of left axilla.  # 06/03/2019 underwent elective left prophylactic mastectomy and sentinel lymph node biopsy of left axilla. Pathology negative for malignancy. Status post Mediport removal on 06/03/2019. She also underwent right implant removal on 06/03/2019.   She also underwent right implant removal on 06/03/2019.  #  chemotherapy-induced neuropathy, bilateral fingertips and lower extremities.  Patient is on Lyrica and nortriptyline.  Follows up with neurology. She follows up with lymphedema clinic.  INTERVAL HISTORY 39yo female with above oncology history reviewed by me presents for follow-up of management of breast cancer.  Patient is on letrozole 2.5 mg daily. Patient has chronic joint stiffness and pain.  Manageable hot flash Neuropathy, takes Lyrica and nortriptyline follows up with neurology. She continues to have intermittent nausea and takes Phenergan.  Requests refills. Patient has GERD, takes Nexium.   Review of Systems  Constitutional: Negative for chills, fever, malaise/fatigue and weight loss.  HENT: Negative for sore throat.   Eyes: Negative for redness.  Respiratory: Negative for cough, shortness of breath and wheezing.   Cardiovascular: Negative for chest pain, palpitations and leg swelling.  Gastrointestinal: Positive for nausea. Negative for abdominal pain, blood in stool and vomiting.  Genitourinary: Negative for dysuria.  Musculoskeletal: Positive for joint pain. Negative for myalgias.  Skin: Negative for rash.  Neurological: Positive for tingling. Negative for dizziness and tremors.  Endo/Heme/Allergies: Does not bruise/bleed easily.  Psychiatric/Behavioral: Negative for hallucinations.    No Known Allergies  Patient Active Problem List   Diagnosis Date Noted  . Fracture of neck of metacarpal bone 05/14/2019  . Chronic fatigue 04/09/2019  . Polyarthralgia  04/09/2019  . Status post right breast reconstruction 02/26/2019  .  Status post right mastectomy 02/26/2019  . Mastalgia 02/15/2019  . Shortness of breath 08/23/2018  . Nonischemic cardiomyopathy ([place]) 08/23/2018  . Preprocedural cardiovascular examination 08/23/2018  . Tachycardia 08/23/2018  . Palpitations 08/23/2018  . Estrogen receptor positive status (ER+) 04/04/2018  . Acquired absence of right breast and nipple 04/03/2018  . Breast cancer of upper-outer quadrant of right female breast ([place]) 03/19/2018  . Family history of cancer   . Malignant neoplasm of overlapping sites of right breast in female, estrogen receptor positive ([place]) 10/19/2017  . Gastroesophageal reflux disease without esophagitis 02/24/2017  . Generalized anxiety disorder 10/03/2014  . Headache 10/03/2014     Past Medical History:  Diagnosis Date  . Anemia   . BRCA negative 11/26/2017  . Breast cancer ([place]) 10/11/2017   Multifocal, ER positive, PR negative, HER-2 negative. ypT3 ypN2a 8.7 cm, 4/15 nodes  . Cardiomyopathy ([place])    a. 10/2017 Echo: EF 60-65%, no rwma, Gr1 DD, nl RV size/fxn; b. 04/2018 Echo: EF 55-60%, no rwma, Nl RV size/fxn; c. 08/2018 Echo: EF 45%, diff HK, ? HK of antsept wall. Gr1 DD. Mild MR. Mild LAE/RAE. Mod dil RV.   [name] Chronic bronchitis ([place]) 11/2017  . COPD (chronic obstructive pulmonary disease) ([place])    MILD PER CXR  . Depression   . Family history of cancer   . GERD (gastroesophageal reflux disease)   . Headache    MIGRAINES  . Heart murmur    ASYMPTOMATIC  . Personal history of chemotherapy    current for right breast ca     Past Surgical History:  Procedure Laterality Date  . AXILLARY LYMPH NODE DISSECTION Right 03/19/2018   Procedure: AXILLARY LYMPH NODE DISSECTION;  Surgeon: Robert Bellow, MD;  Location: [facility] ORS;  Service: General;  Laterality: Right;  . BREAST BIOPSY Right 10/11/2017   12:30 posterior coil clip invasive mammary carcinoma  . BREAST BIOPSY Right  10/11/2017   11:30 middle depth ribbon clip DCIS  . BREAST BIOPSY Right 10/11/2017   5:30 anterior depth x shape invasive ductal carcinoma  . BREAST IMPLANT REMOVAL Right 06/03/2019   Procedure: REMOVAL OF RIGHT BREAST IMPLANTS;  Surgeon: Wallace Going, DO;  Location: [facility] ORS;  Service: Plastics;  Laterality: Right;  . BREAST RECONSTRUCTION WITH PLACEMENT OF TISSUE EXPANDER AND FLEX HD (ACELLULAR HYDRATED DERMIS) Right 03/19/2018   Procedure: BREAST RECONSTRUCTION WITH PLACEMENT OF TISSUE EXPANDER AND FLEX HD (ACELLULAR HYDRATED DERMIS);  Surgeon: Wallace Going, DO;  Location: [facility];  Service: Plastics;  Laterality: Right;  . LAPAROSCOPIC BILATERAL SALPINGO OOPHERECTOMY Bilateral 03/19/2018   Procedure: LAPAROSCOPIC BILATERAL SALPINGO OOPHORECTOMY;  Surgeon: Benjaman Kindler, MD;  Location: [facility];  Service: Gynecology;  Laterality: Bilateral;  . MASTECTOMY Right 03/2018  . MASTECTOMY W/ SENTINEL NODE BIOPSY Right 03/19/2018   Procedure: MASTECTOMY WITH SENTINEL LYMPH NODE BIOPSY;  Surgeon: Robert Bellow, MD;  Location: [facility];  Service: General;  Laterality: Right;  . PORT-A-CATH REMOVAL Left 06/03/2019   Procedure: REMOVAL PORT-A-CATH;  Surgeon: Robert Bellow, MD;  Location: [facility];  Service: General;  Laterality: Left;  . PORTACATH PLACEMENT Left 10/24/2017   Procedure: INSERTION PORT-A-CATH;  Surgeon: Robert Bellow, MD;  Location: [facility];  Service: General;  Laterality: Left;  . REMOVAL OF TISSUE EXPANDER AND PLACEMENT OF IMPLANT Right 07/20/2018   Procedure: REMOVAL OF RIGHT BREAST TISSUE EXPANDER AND PLACEMENT OF IMPLANT;  Surgeon: Wallace Going, DO;  Location: [place];  Service: Plastics;  Laterality: Right;  .  SIMPLE MASTECTOMY WITH AXILLARY SENTINEL NODE BIOPSY Left 06/03/2019   Procedure: SIMPLE MASTECTOMY LEFT;  Surgeon: Robert Bellow, MD;  Location: [facility];  Service: General;  Laterality: Left;    Social History    Socioeconomic History  . Marital status: Married    Spouse name: Not on file  . Number of children: Not on file  . Years of education: Not on file  . Highest education level: Not on file  Occupational History  . Occupation: Occupational psychologist    Comment: Therapist, art   Tobacco Use  . Smoking status: Current Every Day Smoker    Packs/day: 0.50    Years: 18.00    Pack years: 9.00    Types: Cigarettes    Start date: 06/21/2018  . Smokeless tobacco: Never Used  Substance and Sexual Activity  . Alcohol use: No  . Drug use: No  . Sexual activity: Yes    Birth control/protection: Injection  Other Topics Concern  . Not on file  Social History Narrative  . Not on file   Social Determinants of Health   Financial Resource Strain:   . Difficulty of Paying Living Expenses:   Food Insecurity:   . Worried About Charity fundraiser in the Last Year:   . Arboriculturist in the Last Year:   Transportation Needs:   . Film/video editor (Medical):   [name] Lack of Transportation (Non-Medical):   Physical Activity:   . Days of Exercise per Week:   . Minutes of Exercise per Session:   Stress:   . Feeling of Stress :   Social Connections:   . Frequency of Communication with Friends and Family:   . Frequency of Social Gatherings with Friends and Family:   . Attends Religious Services:   . Active Member of Clubs or Organizations:   . Attends Archivist Meetings:   [name] Marital Status:   Intimate Partner Violence:   . Fear of Current or Ex-Partner:   . Emotionally Abused:   [name] Physically Abused:   . Sexually Abused:      Family History  Problem Relation Age of Onset  . Melanoma Maternal Aunt        other aunts with BCC/SCC/Melanoma  . Diabetes Father   . Hypertension Father   . Hyperlipidemia Father   . Heart attack Father 76       "mild"  . Bladder Cancer Maternal Grandmother   . Cervical cancer Maternal Aunt 64       daughter w/ cervical cancer  as well  . Melanoma Maternal Uncle        other uncles with BCC/SCC/Melanoma     Current Outpatient Medications:  .  albuterol (VENTOLIN HFA) 108 (90 Base) MCG/ACT inhaler, Inhale into the lungs every 6 (six) hours as needed., Disp: , Rfl:  .  buPROPion (WELLBUTRIN SR) 150 MG 12 hr tablet, Take 150 mg by mouth 2 (two) times daily., Disp: , Rfl:  .  Calcium Carbonate-Vitamin D3 (CALCIUM 600/VITAMIN D) 600-400 MG-UNIT TABS, Take 2 tablets by mouth daily., Disp: 180 tablet, Rfl: 0 .  cholecalciferol (VITAMIN D3) 25 MCG (1000 UNIT) tablet, Take 1 tablet (1,000 Units total) by mouth daily., Disp: 90 tablet, Rfl: 0 .  cyanocobalamin (,VITAMIN B-12,) 1000 MCG/ML injection, Inject 1,000 mcg into the skin every 30 (thirty) days., Disp: , Rfl:  .  cyclobenzaprine (FLEXERIL) 5 MG tablet, Take 5 mg by mouth 3 (three) times daily., Disp: ,  Rfl:  .  escitalopram (LEXAPRO) 20 MG tablet, Take 20 mg by mouth daily., Disp: , Rfl:  .  esomeprazole (NEXIUM) 40 MG capsule, Take 40 mg by mouth daily at 12 noon., Disp: , Rfl:  .  folic acid (FOLVITE) 1 MG tablet, Take 1 mg by mouth daily., Disp: , Rfl:  .  ibuprofen (ADVIL) 800 MG tablet, Take 800 mg by mouth every 8 (eight) hours as needed., Disp: , Rfl:  .  Investigational letrozole/placebo tablet NSABP B-42, Take 1 tablet by mouth daily., Disp: , Rfl:  .  letrozole (FEMARA) 2.5 MG tablet, Take 2.5 mg by mouth daily., Disp: , Rfl:  .  loratadine (CLARITIN) 10 MG tablet, Take 10 mg by mouth daily., Disp: , Rfl:  .  LORazepam (ATIVAN) 1 MG tablet, Take 1 mg by mouth 3 (three) times daily., Disp: , Rfl:  .  Magnesium 400 MG TABS, Take 400 mg by mouth 2 (two) times daily., Disp: , Rfl:  .  meloxicam (MOBIC) 7.5 MG tablet, Take 7.5 mg by mouth 2 (two) times a day., Disp: , Rfl:  .  methotrexate (RHEUMATREX) 2.5 MG tablet, Take 2.5 mg by mouth once a week. Take 8 tablets once a week, Disp: , Rfl:  .  metoprolol succinate (TOPROL-XL) 25 MG 24 hr tablet, Take 11/2  tablets by mouth daily., Disp: , Rfl:  .  nortriptyline (PAMELOR) 10 MG capsule, Take 30 mg by mouth at bedtime. , Disp: , Rfl:  .  phentermine (ADIPEX-P) 37.5 MG tablet, Take by mouth., Disp: , Rfl:  .  pregabalin (LYRICA) 300 MG capsule, Take 1 capsule (300 mg total) by mouth daily., Disp: 30 capsule, Rfl: 3 .  promethazine (PHENERGAN) 25 MG tablet, Take 1 tablet (25 mg total) by mouth every 8 (eight) hours as needed for nausea or vomiting., Disp: 90 tablet, Rfl: 0 .  pyridOXINE (VITAMIN B-6) 100 MG tablet, Take 100 mg by mouth daily., Disp: , Rfl:  .  topiramate (TOPAMAX) 25 MG tablet, Take by mouth., Disp: , Rfl:  .  vitamin C (ASCORBIC ACID) 500 MG tablet, Take 500 mg by mouth daily., Disp: , Rfl:  No current facility-administered medications for this visit.  Facility-Administered Medications Ordered in Other Visits:  .  heparin lock flush 100 unit/mL, 500 Units, Intravenous, Once, Earlie Server, MD .  sodium chloride flush (NS) 0.9 % injection 10 mL, 10 mL, Intravenous, PRN, Earlie Server, MD, 10 mL at 11/19/18 1249 .  sodium chloride flush (NS) 0.9 % injection 10 mL, 10 mL, Intravenous, Once, Earlie Server, MD   Physical exam:  Vitals:   04/10/20 1313  BP: 109/70  Pulse: 89  Resp: 18  Temp: 97.6 F (36.4 C)  Weight: 191 lb 9.6 oz (86.9 kg)  ECOG 1 Physical Exam  Constitutional: She is oriented to person, place, and time. No distress.  HENT:  Head: Normocephalic and atraumatic.  Nose: Nose normal.  Mouth/Throat: Oropharynx is clear and moist. No oropharyngeal exudate.  Eyes: Pupils are equal, round, and reactive to light. Conjunctivae and EOM are normal. Left eye exhibits no discharge. No scleral icterus.  Neck: No JVD present.  Cardiovascular: Normal rate, regular rhythm and normal heart sounds.  No murmur heard. Pulmonary/Chest: Effort normal and breath sounds normal. No respiratory distress. She has no wheezes. She has no rales. She exhibits no tenderness.  Abdominal: Soft. Bowel  sounds are normal. She exhibits no distension and no mass. There is no abdominal tenderness. There is no rebound.  Musculoskeletal:        General: No edema. Normal range of motion.     Cervical back: Normal range of motion and neck supple.  Lymphadenopathy:    She has no cervical adenopathy.  Neurological: She is alert and oriented to person, place, and time. No cranial nerve deficit. She exhibits normal muscle tone. Coordination normal.  Skin: Skin is warm and dry. No rash noted. She is not diaphoretic. No erythema.  Psychiatric: Affect normal.        CMP Latest Ref Rng & Units 04/10/2020  Glucose 70 - 99 mg/dL 115(H)  BUN 6 - 20 mg/dL 17  Creatinine 0.44 - 1.00 mg/dL 0.85  Sodium 135 - 145 mmol/L 140  Potassium 3.5 - 5.1 mmol/L 3.3(L)  Chloride 98 - 111 mmol/L 106  CO2 22 - 32 mmol/L 25  Calcium 8.9 - 10.3 mg/dL 8.8(L)  Total Protein 6.5 - 8.1 g/dL 7.3  Total Bilirubin 0.3 - 1.2 mg/dL 0.6  Alkaline Phos 38 - 126 U/L 80  AST 15 - 41 U/L 30  ALT 0 - 44 U/L 23   CBC Latest Ref Rng & Units 04/10/2020  WBC 4.0 - 10.5 K/uL 8.0  Hemoglobin 12.0 - 15.0 g/dL 11.3(L)  Hematocrit 36.0 - 46.0 % 34.2(L)  Platelets 150 - 400 K/uL 290   RADIOGRAPHIC STUDIES: I have personally reviewed the radiological images as listed and agreed with the findings in the report. 08/23/2018 DEXA The BMD measured at Femur Neck Right is 0.900 g/cm2 with a T-score of -1.0. This patient is considered normal according to [facility]) criteria. Site Region Measured Measured WHO Young Adult BMD Date       Age      Classification T-score AP Spine L1-L4 08/23/2018 35.5 Normal -0.8 1.091 g/cm2 DualFemur Neck Right 08/23/2018 35.5 Normal -1.0 0.900 g/cm2  08/15/2018 2D echo Study Conclusions - Left ventricle: The cavity size was mildly dilated. Systolic   function was mildly reduced. The estimated ejection fraction was   45%. Diffuse hypokinesis. Hypokinesis of the anteroseptal    myocardium. Doppler parameters are consistent with abnormal left   ventricular relaxation (grade 1 diastolic dysfunction). - Aortic valve: Valve area (VTI): 1.88 cm^2. Valve area (Vmax): 1.8   cm^2. Valve area (Vmean): 1.74 cm^2. - Mitral valve: There was mild regurgitation. Valve area by   continuity equation (using LVOT flow): 2.22 cm^2. - Left atrium: The atrium was mildly dilated. - Right ventricle: The cavity size was moderately dilated. - Right atrium: The atrium was mildly dilated.  RADIOGRAPHIC STUDIES: I have personally reviewed the radiological images as listed and agreed with the findings in the report. 10/30/2018 Bone scan whole body Non scintigraphic evidence of osseous metastatic disease.  Assessment and plan- Patient is a 39 y.o. female presents for evaluation of newly diagnosed multicentric right breast cancer.  Cancer Staging Breast cancer of upper-outer quadrant of right female breast [facility]) Staging form: Breast, AJCC 8th Edition - Clinical: G2, ER+, PR+, HER2- - Signed by Earlie Server, MD on 04/05/2018 - Pathologic stage from 04/04/2018: No Stage Recommended (ypT3, pN2, cM0, G3, ER+, PR-, HER2-) - Signed by Earlie Server, MD on 04/04/2018    1. Malignant neoplasm of upper-outer quadrant of right breast in female, estrogen receptor positive ([place])   2. Neuropathy due to chemotherapeutic drug ([place])   3. Aromatase inhibitor use   4. Vasomotor symptoms due to menopause   5. Nausea and vomiting, intractability of vomiting not specified, unspecified vomiting type  Cancer Staging Breast cancer of upper-outer quadrant of right female breast [facility]) Staging form: Breast, AJCC 8th Edition - Clinical: G2, ER+, PR+, HER2- - Signed by Earlie Server, MD on 04/05/2018 - Pathologic stage from 04/04/2018: No Stage Recommended (ypT3, pN2, cM0, G3, ER+, PR-, HER2-) - Signed by Earlie Server, MD on 04/04/2018   # Stage IIIA right breast cancer, ER/ PR positive, HER-2 negative status post bilateral  oophorectomy, Right mastectomy and right axillary lymph node dissection, status post implant, and implant removal.  Status post left mastectomy and axillary sentinel lymph node biopsy. Status post port removal. Labs reviewed and discussed with patient. Continue letrozole 2.5 mg daily.  Recommend total of 10 years of treatment if she is able to tolerate. She is post menopause status post bilateral nephrectomy.  She does have severe chronic joint pain and stiffness I recommend that she may try hold off letrozole for short period of time-i.e. 1 week-to see if a transient break from letrozole would help her symptoms.  Intermittent nausea, Phenergan prescription was refilled. Etiology is unclear.  Possible due to hormonal changes/postmenopausal. She does have a history of GERD on Nexium and I recommend patient to see GI for further evaluation of any possible gastroenterology etiology for her persistent nausea Nausea has been a chronic issue for her.  We could also consider brain images if her nausea symptom gets worse  #Adjuvant bisphosphonate:  Zometa on 01/10/2020.  Due for next dose of Zometa in July 2021. Recommend patient to take calcium and vitamin supplementations. #Chemotherapy-induced neuropathy, continue Lyrica and nortriptyline.  Symptoms are stable.  Follow-up with neurology.. . #Depression.  Continue Lexapro 20 mg daily. #Mediport has been removed. #Lung nodule, small pulmonary nodules attention on follow-up studies.   01/24/2020 CT chest without contrast was independently reviewed and discussed with patient.  Small nonspecific lung nodules are again changed from previous exam.  Asymmetric groundglass attenuation and interstitial reticulation in the right apex. #Mild hyperkalemia, advised patient to increase consumption of potassium rich food.  Follow-up in 3 to 4 months Earlie Server, MD, PhD Hematology Oncology [place] at [facility]  04/10/20

## 2020-04-13 ENCOUNTER — Other Ambulatory Visit: Payer: Self-pay | Admitting: Oncology

## 2020-04-13 ENCOUNTER — Other Ambulatory Visit: Payer: Self-pay

## 2020-04-13 ENCOUNTER — Other Ambulatory Visit: Payer: Self-pay | Admitting: General Surgery

## 2020-04-13 DIAGNOSIS — Z170 Estrogen receptor positive status [ER+]: Secondary | ICD-10-CM

## 2020-04-13 DIAGNOSIS — D649 Anemia, unspecified: Secondary | ICD-10-CM

## 2020-04-13 DIAGNOSIS — R1011 Right upper quadrant pain: Secondary | ICD-10-CM

## 2020-04-13 DIAGNOSIS — R112 Nausea with vomiting, unspecified: Secondary | ICD-10-CM

## 2020-04-13 DIAGNOSIS — C50411 Malignant neoplasm of upper-outer quadrant of right female breast: Secondary | ICD-10-CM

## 2020-04-13 LAB — PATHOLOGIST SMEAR REVIEW

## 2020-04-14 ENCOUNTER — Ambulatory Visit
Admission: RE | Admit: 2020-04-14 | Discharge: 2020-04-14 | Disposition: A | Discharge status: Home / Self Care | Payer: Managed Care, Other (non HMO) | Source: Ambulatory Visit | Attending: General Surgery | Admitting: General Surgery

## 2020-04-14 ENCOUNTER — Other Ambulatory Visit: Payer: Self-pay

## 2020-04-14 DIAGNOSIS — R1011 Right upper quadrant pain: Secondary | ICD-10-CM

## 2020-04-14 DIAGNOSIS — R112 Nausea with vomiting, unspecified: Secondary | ICD-10-CM | POA: Diagnosis not present

## 2020-04-14 IMAGING — US US ABDOMEN LIMITED
1 series · 14 of 25 positions shown · non-contrast
Comparison: None.

CLINICAL DATA: Right upper quadrant pain with nausea and vomiting

EXAM:
ULTRASOUND ABDOMEN LIMITED RIGHT UPPER QUADRANT

[Series 1: us abdomen limited · 0.23mm/px · 14 of 37 slices shown]
[im 1/37]
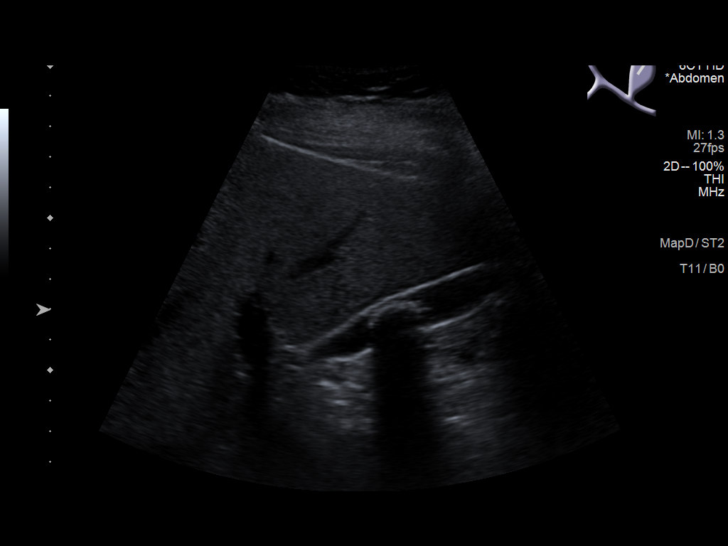
[im 4/37]
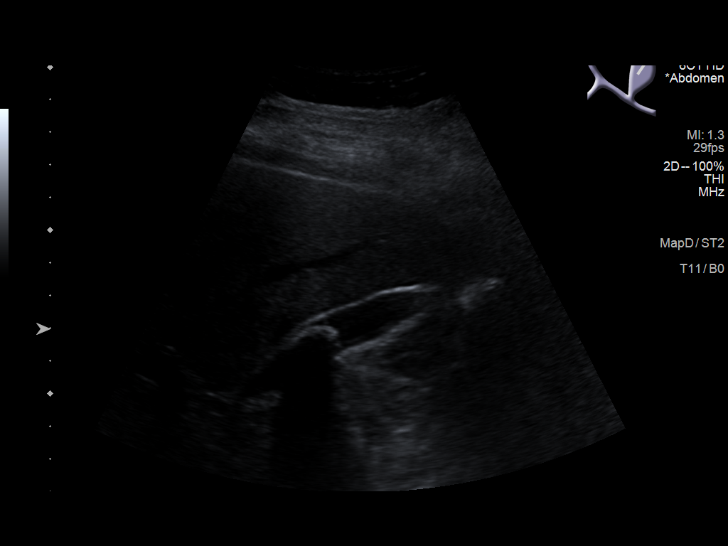
[im 7/37]
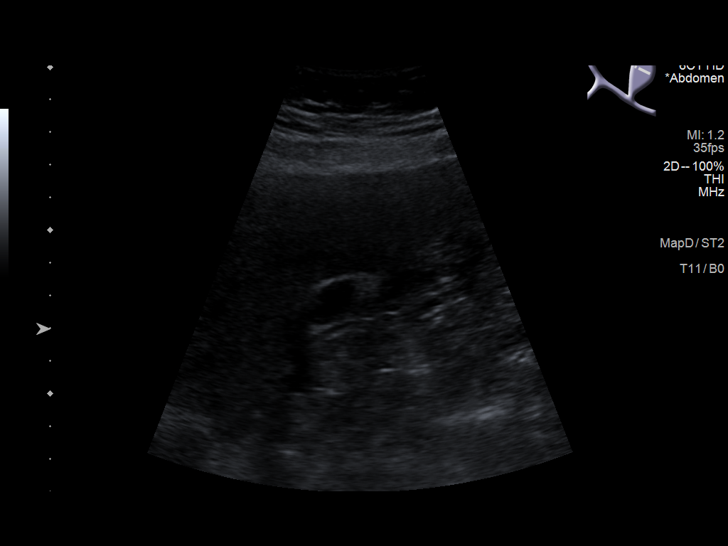
[im 10/37]
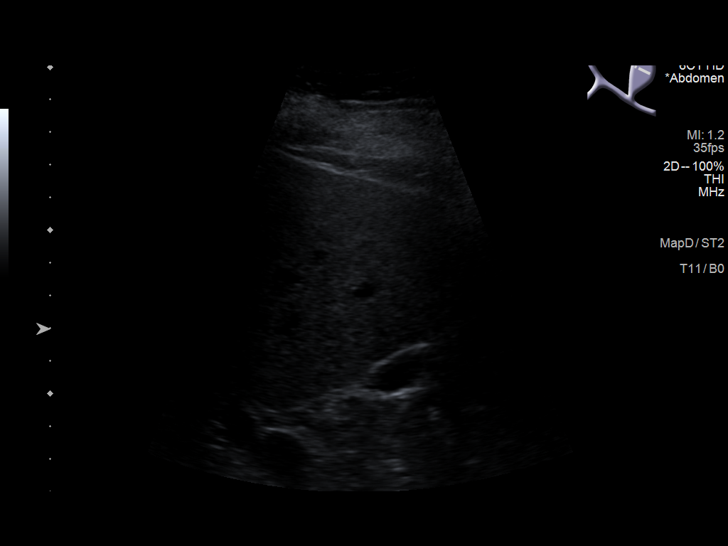
[im 13/37]
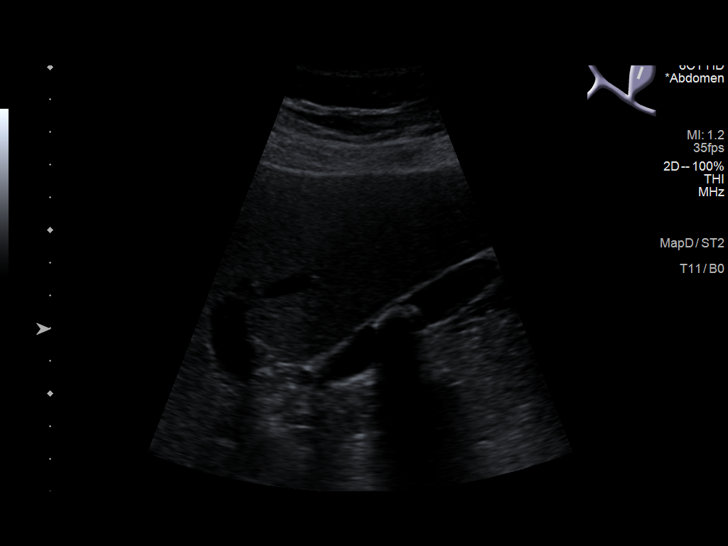
[im 14/37]
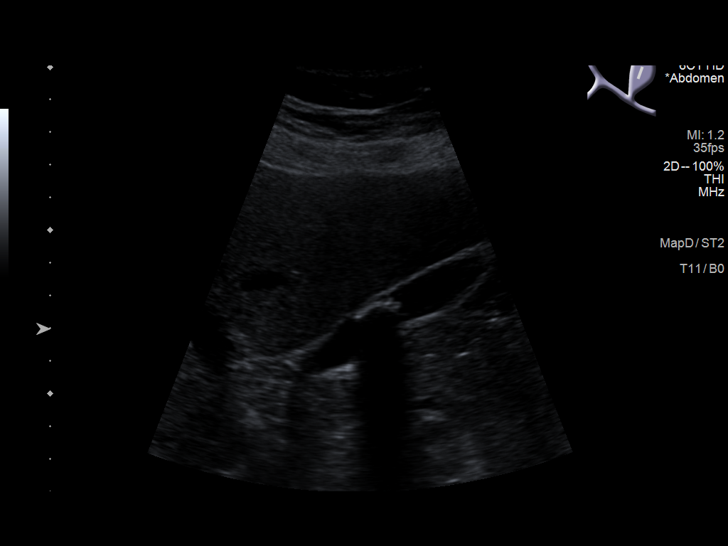
[im 17/37]
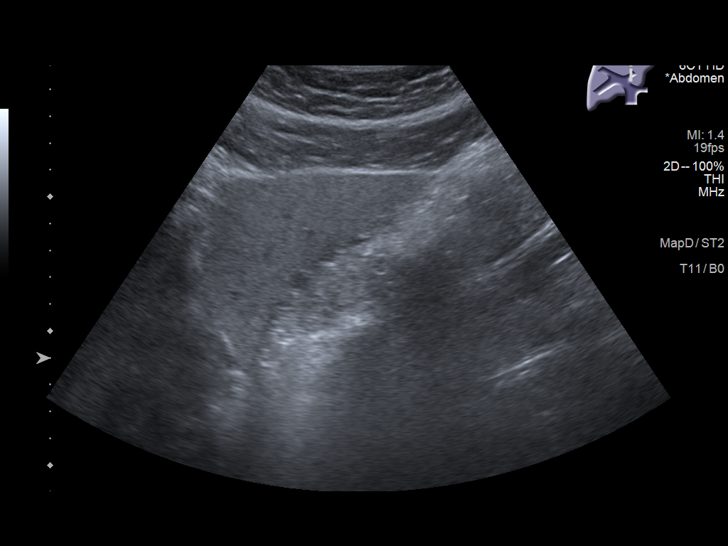
[im 20/37]
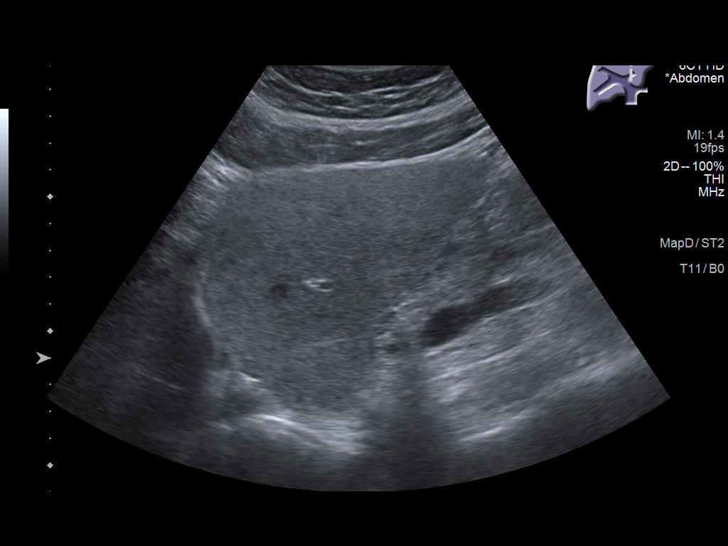
[im 23/37]
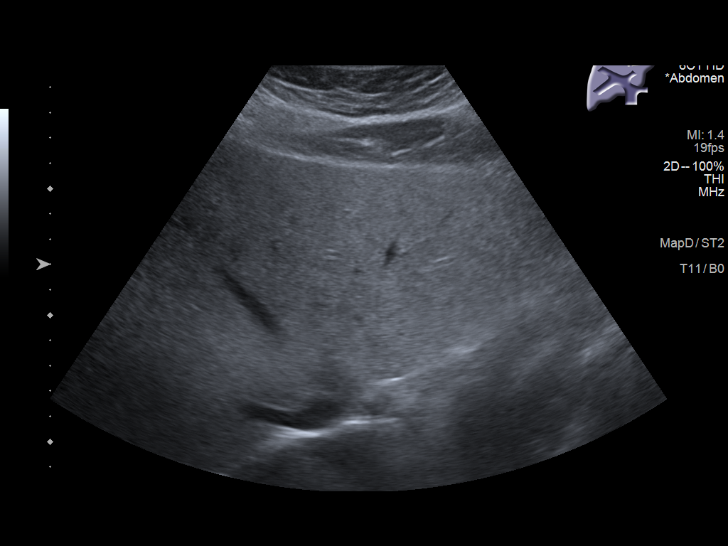
[im 25/37]
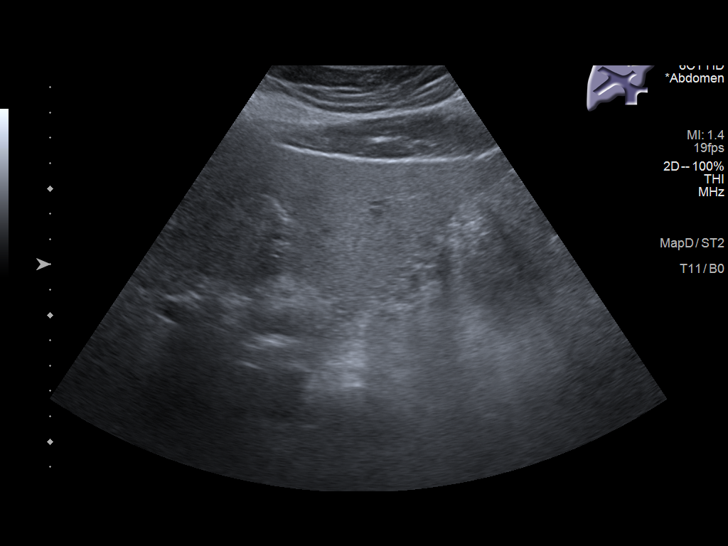
[im 28/37]
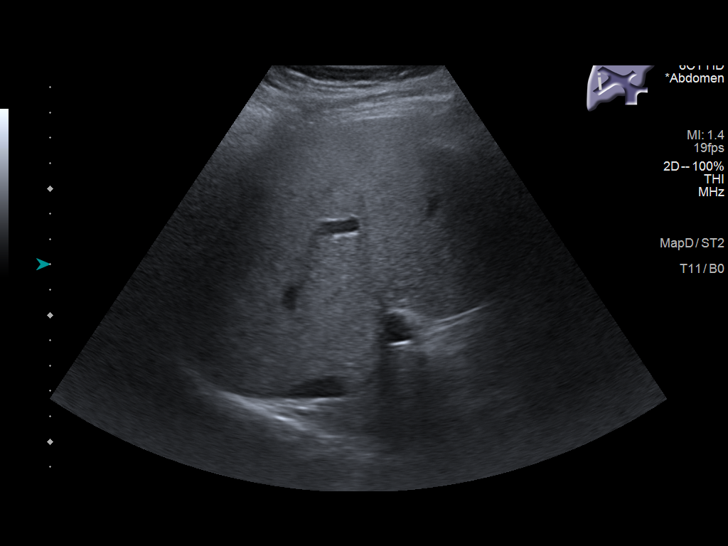
[im 31/37]
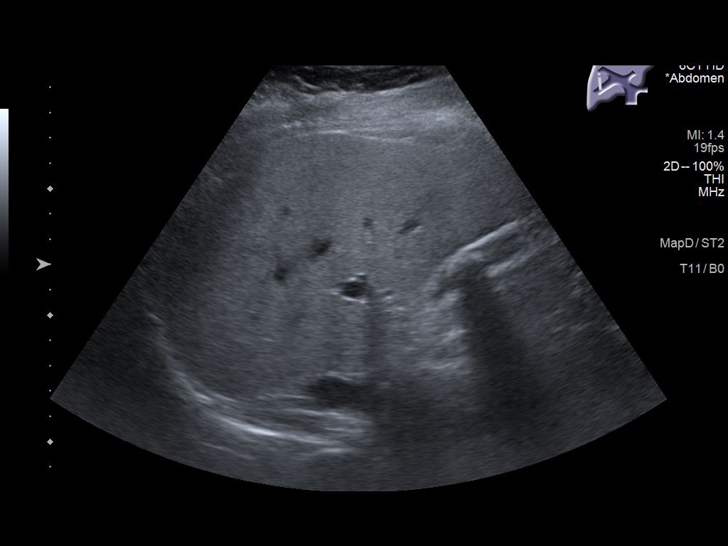
[im 34/37]
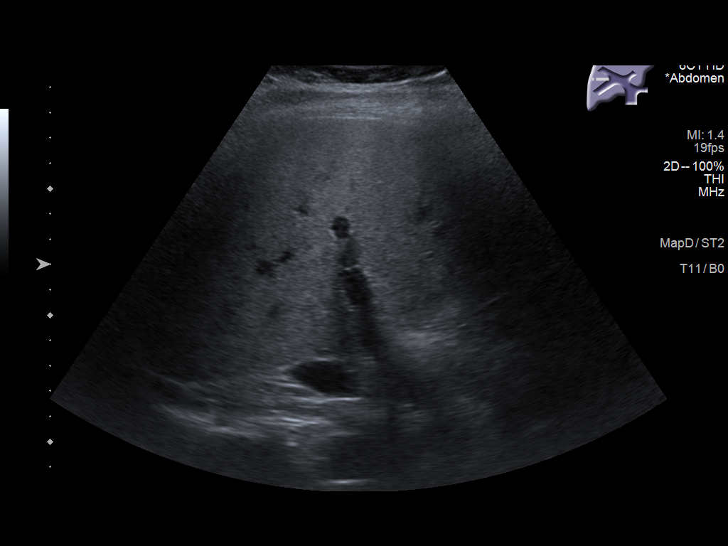
[im 37/37]
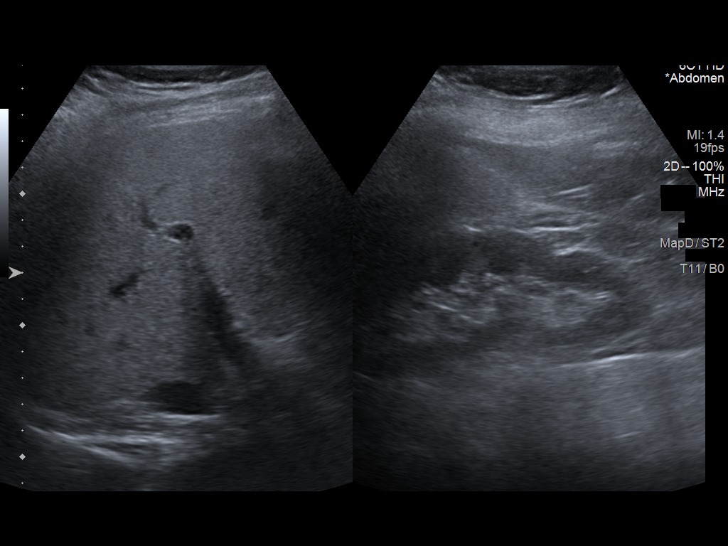

[14 of 25 positions shown; findings below may reference images not displayed]

FINDINGS: Gallbladder:

Within the gallbladder, there is a 2.0 cm echogenic focus which
moves and shadows consistent with cholelithiasis. There is no
gallbladder wall thickening or pericholecystic fluid. No sonographic
Murphy sign noted by sonographer.

Common bile duct:

Diameter: 3 mm. No intrahepatic or extrahepatic biliary duct
dilatation.

Liver:

No focal lesion identified. Liver echogenicity overall is increased.
Portal vein is patent on color Doppler imaging with normal direction
of blood flow towards the liver.

Other: None.
IMPRESSION: 1. Cholelithiasis. No gallbladder wall thickening or pericholecystic
fluid.

2. Increase in liver echogenicity, a finding indicative of hepatic
steatosis. No focal liver lesions evident.

## 2020-04-15 ENCOUNTER — Other Ambulatory Visit
Admission: RE | Admit: 2020-04-15 | Discharge: 2020-04-15 | Disposition: A | Discharge status: Home / Self Care | Payer: Medicare HMO | Source: Ambulatory Visit | Attending: General Surgery | Admitting: General Surgery

## 2020-04-15 DIAGNOSIS — Z20822 Contact with and (suspected) exposure to covid-19: Secondary | ICD-10-CM | POA: Diagnosis not present

## 2020-04-15 DIAGNOSIS — Z01812 Encounter for preprocedural laboratory examination: Secondary | ICD-10-CM | POA: Insufficient documentation

## 2020-04-15 LAB — SARS CORONAVIRUS 2 (TAT 6-24 HRS): SARS Coronavirus 2: NEGATIVE

## 2020-04-16 ENCOUNTER — Encounter: Payer: Self-pay | Admitting: *Deleted

## 2020-04-17 ENCOUNTER — Ambulatory Visit: Payer: Medicare HMO | Admitting: Anesthesiology

## 2020-04-17 ENCOUNTER — Encounter
Admission: RE | Disposition: A | Discharge status: Home / Self Care | Payer: Self-pay | Source: Home / Self Care | Attending: General Surgery

## 2020-04-17 ENCOUNTER — Other Ambulatory Visit: Payer: Self-pay

## 2020-04-17 ENCOUNTER — Ambulatory Visit
Admission: RE | Admit: 2020-04-17 | Discharge: 2020-04-17 | Disposition: A | Discharge status: Home / Self Care | Payer: Medicare HMO | Attending: General Surgery | Admitting: General Surgery

## 2020-04-17 DIAGNOSIS — J449 Chronic obstructive pulmonary disease, unspecified: Secondary | ICD-10-CM | POA: Diagnosis not present

## 2020-04-17 DIAGNOSIS — R1013 Epigastric pain: Secondary | ICD-10-CM | POA: Insufficient documentation

## 2020-04-17 DIAGNOSIS — K3189 Other diseases of stomach and duodenum: Secondary | ICD-10-CM | POA: Diagnosis not present

## 2020-04-17 DIAGNOSIS — Z9011 Acquired absence of right breast and nipple: Secondary | ICD-10-CM | POA: Diagnosis not present

## 2020-04-17 DIAGNOSIS — Z170 Estrogen receptor positive status [ER+]: Secondary | ICD-10-CM | POA: Insufficient documentation

## 2020-04-17 DIAGNOSIS — Z79899 Other long term (current) drug therapy: Secondary | ICD-10-CM | POA: Insufficient documentation

## 2020-04-17 DIAGNOSIS — Z791 Long term (current) use of non-steroidal anti-inflammatories (NSAID): Secondary | ICD-10-CM | POA: Diagnosis not present

## 2020-04-17 DIAGNOSIS — I429 Cardiomyopathy, unspecified: Secondary | ICD-10-CM | POA: Insufficient documentation

## 2020-04-17 DIAGNOSIS — Z9221 Personal history of antineoplastic chemotherapy: Secondary | ICD-10-CM | POA: Diagnosis not present

## 2020-04-17 DIAGNOSIS — F17210 Nicotine dependence, cigarettes, uncomplicated: Secondary | ICD-10-CM | POA: Insufficient documentation

## 2020-04-17 DIAGNOSIS — F329 Major depressive disorder, single episode, unspecified: Secondary | ICD-10-CM | POA: Diagnosis not present

## 2020-04-17 DIAGNOSIS — C50911 Malignant neoplasm of unspecified site of right female breast: Secondary | ICD-10-CM | POA: Diagnosis not present

## 2020-04-17 DIAGNOSIS — K2900 Acute gastritis without bleeding: Secondary | ICD-10-CM | POA: Insufficient documentation

## 2020-04-17 DIAGNOSIS — G43909 Migraine, unspecified, not intractable, without status migrainosus: Secondary | ICD-10-CM | POA: Diagnosis not present

## 2020-04-17 DIAGNOSIS — Z79811 Long term (current) use of aromatase inhibitors: Secondary | ICD-10-CM | POA: Insufficient documentation

## 2020-04-17 DIAGNOSIS — Z853 Personal history of malignant neoplasm of breast: Secondary | ICD-10-CM | POA: Insufficient documentation

## 2020-04-17 HISTORY — PX: ESOPHAGOGASTRODUODENOSCOPY (EGD) WITH PROPOFOL: SHX5813

## 2020-04-17 SURGERY — ESOPHAGOGASTRODUODENOSCOPY (EGD) WITH PROPOFOL
Anesthesia: General

## 2020-04-17 MED ORDER — PHENYLEPHRINE HCL (PRESSORS) 10 MG/ML IV SOLN
INTRAVENOUS | Status: AC
  Filled 2020-04-17: qty 1

## 2020-04-17 MED ORDER — LIDOCAINE HCL (PF) 2 % IJ SOLN
INTRAMUSCULAR | Status: AC
  Filled 2020-04-17: qty 5

## 2020-04-17 MED ORDER — LIDOCAINE HCL (CARDIAC) PF 100 MG/5ML IV SOSY
PREFILLED_SYRINGE | INTRAVENOUS | Status: DC | PRN
  Administered 2020-04-17: 80 mg via INTRAVENOUS

## 2020-04-17 MED ORDER — SODIUM CHLORIDE 0.9 % IV SOLN
INTRAVENOUS | Status: DC
  Administered 2020-04-17: 1000 mL via INTRAVENOUS

## 2020-04-17 MED ORDER — PROPOFOL 10 MG/ML IV BOLUS
INTRAVENOUS | Status: DC | PRN
  Administered 2020-04-17: 100 mg via INTRAVENOUS
  Administered 2020-04-17: 50 mg via INTRAVENOUS

## 2020-04-17 MED ORDER — LACTATED RINGERS IV SOLN
INTRAVENOUS | Status: DC | PRN
Start: 2020-04-17 — End: 2020-04-17

## 2020-04-17 MED ORDER — PROPOFOL 10 MG/ML IV BOLUS
INTRAVENOUS | Status: AC
  Filled 2020-04-17: qty 40

## 2020-04-17 NOTE — Anesthesia Preprocedure Evaluation (Signed)
Anesthesia Evaluation  Patient identified by MRN, date of birth, ID band Patient awake    Reviewed: Allergy & Precautions, NPO status , Patient's Chart, lab work & pertinent test results  History of Anesthesia Complications Negative for: history of anesthetic complications  Airway Mallampati: I  TM Distance: >3 FB Neck ROM: Full    Dental no notable dental hx.    Pulmonary neg sleep apnea, COPD (mild), Current SmokerPatient did not abstain from smoking.,    breath sounds clear to auscultation- rhonchi (-) wheezing      Cardiovascular Exercise Tolerance: Good METS(-) hypertension(-) CAD, (-) Past MI, (-) Cardiac Stents and (-) CABG (-) dysrhythmias  Rhythm:Regular Rate:Normal - Systolic murmurs and - Diastolic murmurs TTE 7034: 1. Left ventricular ejection fraction, by estimation, is 60 to 65%. The  left ventricle has normal function. The left ventricle has no regional  wall motion abnormalities. Indeterminate diastolic filling due to E-A  fusion.  2. Right ventricular systolic function is normal. The right ventricular  size is normal.  3. The mitral valve is normal in structure and function. No evidence of  mitral valve regurgitation. No evidence of mitral stenosis.  4. The aortic valve is normal in structure and function. Aortic valve  regurgitation is not visualized. No aortic stenosis is present.    Neuro/Psych  Headaches, PSYCHIATRIC DISORDERS Anxiety Depression    GI/Hepatic Neg liver ROS, GERD  ,  Endo/Other  negative endocrine ROSneg diabetes  Renal/GU negative Renal ROS     Musculoskeletal negative musculoskeletal ROS (+)   Abdominal (+) - obese,   Peds  Hematology  (+) Blood dyscrasia, anemia ,   Anesthesia Other Findings Past Medical History: No date: Anemia 11/26/2017: BRCA negative 10/11/2017: Breast cancer ([place])     Comment:  Multifocal, ER positive, PR negative, HER-2 negative. 11/2017:  Chronic bronchitis ([place]) No date: COPD (chronic obstructive pulmonary disease) ([facility])     Comment:  MILD PER CXR No date: Depression No date: Family history of cancer No date: GERD (gastroesophageal reflux disease) No date: Headache     Comment:  MIGRAINES No date: Heart murmur     Comment:  ASYMPTOMATIC No date: Personal history of chemotherapy     Comment:  current for right breast ca   Reproductive/Obstetrics                             Anesthesia Physical  Anesthesia Plan  ASA: III  Anesthesia Plan: General   Post-op Pain Management:    Induction: Intravenous  PONV Risk Score and Plan: 2 and Ondansetron, Dexamethasone, Midazolam and Promethazine  Airway Management Planned: Natural Airway and Nasal Cannula  Additional Equipment: None  Intra-op Plan:   Post-operative Plan: Extubation in OR  Informed Consent: I have reviewed the patients History and Physical, chart, labs and discussed the procedure including the risks, benefits and alternatives for the proposed anesthesia with the patient or authorized representative who has indicated his/her understanding and acceptance.       Plan Discussed with: CRNA and Anesthesiologist  Anesthesia Plan Comments: (Discussed risks of anesthesia with patient, including possibility of difficulty with spontaneous ventilation under anesthesia necessitating airway intervention, PONV, and rare risks such as cardiac or respiratory or neurological events. Patient understands.)        Anesthesia Quick Evaluation

## 2020-04-17 NOTE — Transfer of Care (Signed)
Immediate Anesthesia Transfer of Care Note  Patient: Theresa Chaney  Procedure(s) Performed: ESOPHAGOGASTRODUODENOSCOPY (EGD) WITH PROPOFOL (N/A )  Patient Location: PACU and Endoscopy Unit  Anesthesia Type:General  Level of Consciousness: awake  Airway & Oxygen Therapy: Patient Spontanous Breathing and Patient connected to nasal cannula oxygen  Post-op Assessment: Report given to RN and Post -op Vital signs reviewed and stable  Post vital signs: Reviewed and stable  Last Vitals:  Vitals Value Taken Time  BP    Temp    Pulse    Resp    SpO2      Last Pain:  Vitals:   04/17/20 0705  TempSrc: Temporal  PainSc: 0-No pain         Complications: No apparent anesthesia complications

## 2020-04-17 NOTE — Anesthesia Procedure Notes (Signed)
Procedure Name: MAC Date/Time: 04/17/2020 7:39 AM Performed by: Arita Miss, MD Pre-anesthesia Checklist: Patient identified, Emergency Drugs available, Suction available, Patient being monitored and Timeout performed Patient Re-evaluated:Patient Re-evaluated prior to induction Oxygen Delivery Method: Nasal cannula Induction Type: IV induction

## 2020-04-17 NOTE — H&P (Signed)
Theresa Chaney [id number] 03-01-1981     HPI: 39 year old with epigastric pain. Constant in nature. For EGD.   Medications Prior to Admission  Medication Sig Dispense Refill Last Dose  . albuterol (VENTOLIN HFA) 108 (90 Base) MCG/ACT inhaler Inhale into the lungs every 6 (six) hours as needed.   Past Week at Unknown time  . buPROPion (WELLBUTRIN SR) 150 MG 12 hr tablet Take 150 mg by mouth 2 (two) times daily.   04/16/2020 at Unknown time  . Calcium Carbonate-Vitamin D3 (CALCIUM 600/VITAMIN D) 600-400 MG-UNIT TABS Take 2 tablets by mouth daily. 180 tablet 0 04/16/2020 at Unknown time  . cholecalciferol (VITAMIN D3) 25 MCG (1000 UNIT) tablet Take 1 tablet (1,000 Units total) by mouth daily. 90 tablet 0 04/16/2020 at Unknown time  . cyanocobalamin (,VITAMIN B-12,) 1000 MCG/ML injection Inject 1,000 mcg into the skin every 30 (thirty) days.   04/16/2020 at Unknown time  . cyclobenzaprine (FLEXERIL) 5 MG tablet Take 5 mg by mouth 3 (three) times daily.   Past Week at Unknown time  . escitalopram (LEXAPRO) 20 MG tablet Take 20 mg by mouth daily.   04/16/2020 at Unknown time  . esomeprazole (NEXIUM) 40 MG capsule Take 40 mg by mouth daily at 12 noon.   04/16/2020 at Unknown time  . folic acid (FOLVITE) 1 MG tablet Take 1 mg by mouth daily.   04/16/2020 at Unknown time  . ibuprofen (ADVIL) 800 MG tablet Take 800 mg by mouth every 8 (eight) hours as needed.   Past Week at Unknown time  . Investigational letrozole/placebo tablet NSABP B-42 Take 1 tablet by mouth daily.   04/16/2020 at Unknown time  . letrozole (FEMARA) 2.5 MG tablet Take 2.5 mg by mouth daily.   04/16/2020 at Unknown time  . loratadine (CLARITIN) 10 MG tablet Take 10 mg by mouth daily.   04/16/2020 at Unknown time  . LORazepam (ATIVAN) 1 MG tablet Take 1 mg by mouth 3 (three) times daily.   04/16/2020 at Unknown time  . Magnesium 400 MG TABS Take 400 mg by mouth 2 (two) times daily.   04/16/2020 at Unknown time  . meloxicam (MOBIC) 7.5 MG tablet Take 7.5 mg  by mouth 2 (two) times a day.   Past Week at Unknown time  . methotrexate (RHEUMATREX) 2.5 MG tablet Take 2.5 mg by mouth once a week. Take 8 tablets once a week   Past Week at Unknown time  . metoprolol succinate (TOPROL-XL) 25 MG 24 hr tablet Take 11/2 tablets by mouth daily.   04/16/2020 at Unknown time  . nortriptyline (PAMELOR) 10 MG capsule Take 30 mg by mouth at bedtime.    04/16/2020 at Unknown time  . phentermine (ADIPEX-P) 37.5 MG tablet Take by mouth.   04/16/2020 at Unknown time  . pregabalin (LYRICA) 300 MG capsule Take 1 capsule (300 mg total) by mouth daily. 30 capsule 3 04/16/2020 at Unknown time  . promethazine (PHENERGAN) 25 MG tablet Take 1 tablet (25 mg total) by mouth every 8 (eight) hours as needed for nausea or vomiting. 90 tablet 0 04/16/2020 at Unknown time  . pyridOXINE (VITAMIN B-6) 100 MG tablet Take 100 mg by mouth daily.   04/16/2020 at Unknown time  . topiramate (TOPAMAX) 25 MG tablet Take by mouth.   04/16/2020 at Unknown time  . vitamin C (ASCORBIC ACID) 500 MG tablet Take 500 mg by mouth daily.   04/16/2020 at Unknown time   No Known Allergies Past Medical  History:  Diagnosis Date  . Anemia   . BRCA negative 11/26/2017  . Breast cancer ([place]) 10/11/2017   Multifocal, ER positive, PR negative, HER-2 negative. ypT3 ypN2a 8.7 cm, 4/15 nodes  . Cardiomyopathy ([place])    a. 10/2017 Echo: EF 60-65%, no rwma, Gr1 DD, nl RV size/fxn; b. 04/2018 Echo: EF 55-60%, no rwma, Nl RV size/fxn; c. 08/2018 Echo: EF 45%, diff HK, ? HK of antsept wall. Gr1 DD. Mild MR. Mild LAE/RAE. Mod dil RV.   [name] Chronic bronchitis ([place]) 11/2017  . COPD (chronic obstructive pulmonary disease) ([place])    MILD PER CXR  . Depression   . Family history of cancer   . GERD (gastroesophageal reflux disease)   . Headache    MIGRAINES  . Heart murmur    ASYMPTOMATIC  . Personal history of chemotherapy    current for right breast ca   Past Surgical History:  Procedure Laterality Date  . AXILLARY LYMPH NODE  DISSECTION Right 03/19/2018   Procedure: AXILLARY LYMPH NODE DISSECTION;  Surgeon: Robert Bellow, MD;  Location: [facility] ORS;  Service: General;  Laterality: Right;  . BREAST BIOPSY Right 10/11/2017   12:30 posterior coil clip invasive mammary carcinoma  . BREAST BIOPSY Right 10/11/2017   11:30 middle depth ribbon clip DCIS  . BREAST BIOPSY Right 10/11/2017   5:30 anterior depth x shape invasive ductal carcinoma  . BREAST IMPLANT REMOVAL Right 06/03/2019   Procedure: REMOVAL OF RIGHT BREAST IMPLANTS;  Surgeon: Wallace Going, DO;  Location: [facility] ORS;  Service: Plastics;  Laterality: Right;  . BREAST RECONSTRUCTION WITH PLACEMENT OF TISSUE EXPANDER AND FLEX HD (ACELLULAR HYDRATED DERMIS) Right 03/19/2018   Procedure: BREAST RECONSTRUCTION WITH PLACEMENT OF TISSUE EXPANDER AND FLEX HD (ACELLULAR HYDRATED DERMIS);  Surgeon: Wallace Going, DO;  Location: [facility] ORS;  Service: Plastics;  Laterality: Right;  . LAPAROSCOPIC BILATERAL SALPINGO OOPHERECTOMY Bilateral 03/19/2018   Procedure: LAPAROSCOPIC BILATERAL SALPINGO OOPHORECTOMY;  Surgeon: Benjaman Kindler, MD;  Location: [facility] OR[facility];  Service: Gynecology;  Laterality: Bilateral;  . MASTECTOMY Right 03/2018  . MASTECTOMY W/ SENTINEL NODE BIOPSY Right 03/19/2018   Procedure: MASTECTOMY WITH SENTINEL LYMPH NODE BIOPSY;  Surgeon: Robert Bellow, MD;  Location: [facility] OR[facility];  Service: General;  Laterality: Right;  . PORT-A-CATH REMOVAL Left 06/03/2019   Procedure: REMOVAL PORT-A-CATH;  Surgeon: Robert Bellow, MD;  Location: [facility] OR[facility];  Service: General;  Laterality: Left;  . PORTACATH PLACEMENT Left 10/24/2017   Procedure: INSERTION PORT-A-CATH;  Surgeon: Robert Bellow, MD;  Location: [facility] OR[facility];  Service: General;  Laterality: Left;  . REMOVAL OF TISSUE EXPANDER AND PLACEMENT OF IMPLANT Right 07/20/2018   Procedure: REMOVAL OF RIGHT BREAST TISSUE EXPANDER AND PLACEMENT OF IMPLANT;  Surgeon: Wallace Going, DO;  Location: [facility];  Service: Plastics;  Laterality: Right;  . SIMPLE MASTECTOMY WITH AXILLARY SENTINEL NODE BIOPSY Left 06/03/2019   Procedure: SIMPLE MASTECTOMY LEFT;  Surgeon: Robert Bellow, MD;  Location: [facility];  Service: General;  Laterality: Left;   Social History   Socioeconomic History  . Marital status: Married    Spouse name: Not on file  . Number of children: Not on file  . Years of education: Not on file  . Highest education level: Not on file  Occupational History  . Occupation: Occupational psychologist    Comment: Therapist, art   Tobacco Use  . Smoking status: Current Every Day Smoker    Packs/day: 0.50  Years: 18.00    Pack years: 9.00    Types: Cigarettes    Start date: 06/21/2018  . Smokeless tobacco: Never Used  Substance and Sexual Activity  . Alcohol use: No  . Drug use: No  . Sexual activity: Yes    Birth control/protection: Injection  Other Topics Concern  . Not on file  Social History Narrative  . Not on file   Social Determinants of Health   Financial Resource Strain:   . Difficulty of Paying Living Expenses:   Food Insecurity:   . Worried About Charity fundraiser in the Last Year:   . Arboriculturist in the Last Year:   Transportation Needs:   . Film/video editor (Medical):   [name] Lack of Transportation (Non-Medical):   Physical Activity:   . Days of Exercise per Week:   . Minutes of Exercise per Session:   Stress:   . Feeling of Stress :   Social Connections:   . Frequency of Communication with Friends and Family:   . Frequency of Social Gatherings with Friends and Family:   . Attends Religious Services:   . Active Member of Clubs or Organizations:   . Attends Archivist Meetings:   [name] Marital Status:   Intimate Partner Violence:   . Fear of Current or Ex-Partner:   . Emotionally Abused:   [name] Physically Abused:   . Sexually Abused:    Social History   Social History Narrative  . Not on file     ROS:  Negative.     PE: HEENT: Negative. Lungs: Clear. Cardio: RR.  Assessment/Plan:  Proceed with planned endoscopy.   Forest Gleason [facility] 04/17/2020

## 2020-04-17 NOTE — Anesthesia Postprocedure Evaluation (Signed)
Anesthesia Post Note  Patient: Theresa Chaney  Procedure(s) Performed: ESOPHAGOGASTRODUODENOSCOPY (EGD) WITH PROPOFOL (N/A )  Patient location during evaluation: Endoscopy Anesthesia Type: General Level of consciousness: awake and alert Pain management: pain level controlled Vital Signs Assessment: post-procedure vital signs reviewed and stable Respiratory status: spontaneous breathing, nonlabored ventilation, respiratory function stable and patient connected to nasal cannula oxygen Cardiovascular status: blood pressure returned to baseline and stable Postop Assessment: no apparent nausea or vomiting Anesthetic complications: no     Last Vitals:  Vitals:   04/17/20 0705 04/17/20 0750  BP: 104/62 98/75  Pulse: (!) 107 96  Resp: 20 20  Temp: (!) 35.8 C   SpO2: 99% 94%    Last Pain:  Vitals:   04/17/20 0705  TempSrc: Temporal  PainSc: 0-No pain                 Arita Miss

## 2020-04-17 NOTE — Op Note (Signed)
[facility] Gastroenterology Patient Name: Theresa Chaney Procedure Date: 04/17/2020 7:22 AM MRN: [id number] Account #: [id number] Date of Birth: [birth year] Admit Type: Outpatient Age: 39 Room: [facility] ENDO ROOM 1 Gender: Female Note Status: Finalized Procedure:             Upper GI endoscopy Indications:           Epigastric abdominal pain Providers:             Robert Bellow, MD Referring MD:          Marguerita Merles, MD (Referring MD) Medicines:             Monitored Anesthesia Care Complications:         No immediate complications. Procedure:             Pre-Anesthesia Assessment:                        - Prior to the procedure, a History and Physical was                         performed, and patient medications, allergies and                         sensitivities were reviewed. The patient's tolerance                         of previous anesthesia was reviewed.                        - The risks and benefits of the procedure and the                         sedation options and risks were discussed with the                         patient. All questions were answered and informed                         consent was obtained.                        After obtaining informed consent, the endoscope was                         passed under direct vision. Throughout the procedure,                         the patient's blood pressure, pulse, and oxygen                         saturations were monitored continuously. The Endoscope                         was introduced through the mouth, and advanced to the                         third part of duodenum. The upper GI endoscopy was  accomplished without difficulty. The patient tolerated                         the procedure well. Findings:      The esophagus was normal.      The examined duodenum was normal.      Diffuse moderate inflammation characterized by erythema was found in the   gastric antrum and in the prepyloric region of the stomach. Biopsies       were taken with a cold forceps for histology.      A small amount of food (residue) was found in the prepyloric region of       the stomach. Impression:            - Normal esophagus.                        - Normal examined duodenum.                        - Acute gastritis. Biopsied.                        - A small amount of food (residue) in the stomach. Recommendation:        - Telephone endoscopist for pathology results in 1 day. Procedure Code(s):     --- Professional ---                        [phone], Esophagogastroduodenoscopy, flexible,                         transoral; with biopsy, single or multiple Diagnosis Code(s):     --- Professional ---                        R10.13, Epigastric pain                        K29.00, Acute gastritis without bleeding CPT copyright 2019 American Medical Association. All rights reserved. The codes documented in this report are preliminary and upon coder review may  be revised to meet current compliance requirements. Robert Bellow, MD 04/17/2020 7:47:56 AM This report has been signed electronically. Number of Addenda: 0 Note Initiated On: 04/17/2020 7:22 AM Estimated Blood Loss:  Estimated blood loss: none.      [facility]

## 2020-04-20 ENCOUNTER — Encounter: Payer: Self-pay | Admitting: Oncology

## 2020-04-20 ENCOUNTER — Encounter: Payer: Self-pay | Admitting: *Deleted

## 2020-04-21 ENCOUNTER — Other Ambulatory Visit: Payer: Self-pay | Admitting: General Surgery

## 2020-04-21 LAB — SURGICAL PATHOLOGY

## 2020-04-23 ENCOUNTER — Encounter
Admission: RE | Admit: 2020-04-23 | Discharge: 2020-04-23 | Disposition: A | Discharge status: Home / Self Care | Payer: Medicare HMO | Source: Ambulatory Visit | Attending: General Surgery | Admitting: General Surgery

## 2020-04-23 ENCOUNTER — Other Ambulatory Visit: Payer: Managed Care, Other (non HMO)

## 2020-04-23 ENCOUNTER — Other Ambulatory Visit: Payer: Self-pay

## 2020-04-23 DIAGNOSIS — R000 Tachycardia, unspecified: Secondary | ICD-10-CM | POA: Diagnosis not present

## 2020-04-23 DIAGNOSIS — Z20822 Contact with and (suspected) exposure to covid-19: Secondary | ICD-10-CM | POA: Diagnosis not present

## 2020-04-23 DIAGNOSIS — J449 Chronic obstructive pulmonary disease, unspecified: Secondary | ICD-10-CM | POA: Diagnosis not present

## 2020-04-23 DIAGNOSIS — Z01818 Encounter for other preprocedural examination: Secondary | ICD-10-CM | POA: Diagnosis present

## 2020-04-23 HISTORY — DX: Unspecified osteoarthritis, unspecified site: M19.90

## 2020-04-23 NOTE — Patient Instructions (Addendum)
Your procedure is scheduled on: 04-27-20 MONDAY Report to Same Day Surgery 2nd floor medical mall [facility] Entrance-take elevator on left to 2nd floor.  Check in with surgery information desk.) To find out your arrival time please call [phone] between 1PM - 3PM on 04-24-20 FRIDAY  Remember: Instructions that are not followed completely may result in serious medical risk, up to and including death, or upon the discretion of your surgeon and anesthesiologist your surgery may need to be rescheduled.    _x___ 1. Do not eat food after midnight the night before your procedure. NO GUM OR CANDY AFTER MIDNIGHT. You may drink clear liquids up to 2 hours before you are scheduled to arrive at the hospital for your procedure.  Do not drink clear liquids within 2 hours of your scheduled arrival to the hospital.  Clear liquids include  --Water or Apple juice without pulp  --Gatorade  --Black Coffee or Clear Tea (No milk, no creamers, do not add anything to the coffee or Tea-sugar ok to add)   ____Ensure clear carbohydrate drink on the way to the hospital for bariatric patients  ____Ensure clear carbohydrate drink 3 hours before surgery.     __x__ 2. No Alcohol for 24 hours before or after surgery.   __x__3. No Smoking or e-cigarettes for 24 prior to surgery.  Do not use any chewable tobacco products for at least 6 hour prior to surgery   ____  4. Bring all medications with you on the day of surgery if instructed.    __x__ 5. Notify your doctor if there is any change in your medical condition     (cold, fever, infections).    x___6. On the morning of surgery brush your teeth with toothpaste and water.  You may rinse your mouth with mouth wash if you wish.  Do not swallow any toothpaste or mouthwash.   Do not wear jewelry, make-up, hairpins, clips or nail polish.  Do not wear lotions, powders, or perfumes.   Do not shave 48 hours prior to surgery. Men may shave face and neck.  Do not bring  valuables to the hospital.    [facility] is not responsible for any belongings or valuables.               Contacts, dentures or bridgework may not be worn into surgery.  Leave your suitcase in the car. After surgery it may be brought to your room.  For patients admitted to the hospital, discharge time is determined by your treatment team.  _  Patients discharged the day of surgery will not be allowed to drive home.  You will need someone to drive you home and stay with you the night of your procedure.    Please read over the following fact sheets that you were given:   [facility] Preparing for Surgery   _x___ TAKE THE FOLLOWING MEDICATION THE MORNING OF SURGERY WITH A SMALL SIP OF WATER. These include:  1. WELLBUTRIN (BUPROPION)  2. FLEXERIL (CYCLOBENAZEPRINE)  3. LEXAPRO (ESCITALOPRAM)  4. NEXIUM (ESOMEPRAZOLE)  5. CLARITIN (LORATADINE)  6. ATIVAN (LORAZEPAM)  7. METOPROLOL (TOPROL)  8. LYRICA (PREGABALIN)  9. TOPAMAX (TOPIRAMATE)  10.TAKE AN EXTRA NEXIUM THE NIGHT BEFORE YOUR SURGERY  ____Fleets enema or Magnesium Citrate as directed.   _x___ Use CHG Soap or sage wipes as directed on instruction sheet   _X___ Use inhalers on the day of surgery and bring to hospital day of surgery-USE YOUR ALBUTEROL INHALER DAY OF SURGERY  [place]   ____ Stop Metformin and Janumet 2 days prior to surgery.    ____ Take 1/2 of usual insulin dose the night before surgery and none on the morning surgery.   ____ Follow recommendations from Cardiologist, Pulmonologist or PCP regarding stopping Aspirin, Coumadin, Plavix ,Eliquis, Effient, or Pradaxa, and Pletal.  X____Stop Anti-inflammatories such as Advil, Aleve, Ibuprofen, Motrin, Naproxen,MOBIC (MELOXICAM) Naprosyn, Goodies powders or aspirin products NOW-OK to take Tylenol    _x___ Stop supplements until after surgery-STOP VITAMIN C AND PHENTERMINE NOW-MAY RESUME AFTER SURGERY   ____ Bring C-Pap to the hospital.

## 2020-04-24 ENCOUNTER — Encounter
Admission: RE | Admit: 2020-04-24 | Discharge: 2020-04-24 | Disposition: A | Discharge status: Home / Self Care | Payer: Medicare HMO | Source: Ambulatory Visit | Attending: General Surgery | Admitting: General Surgery

## 2020-04-24 ENCOUNTER — Other Ambulatory Visit: Payer: Managed Care, Other (non HMO)

## 2020-04-24 DIAGNOSIS — Z01818 Encounter for other preprocedural examination: Secondary | ICD-10-CM | POA: Diagnosis not present

## 2020-04-24 DIAGNOSIS — R000 Tachycardia, unspecified: Secondary | ICD-10-CM | POA: Diagnosis not present

## 2020-04-24 LAB — POTASSIUM: Potassium: 3.5 mmol/L (ref 3.5–5.1)

## 2020-04-24 LAB — SARS CORONAVIRUS 2 (TAT 6-24 HRS): SARS Coronavirus 2: NEGATIVE

## 2020-04-27 ENCOUNTER — Encounter: Payer: Self-pay | Admitting: General Surgery

## 2020-04-27 ENCOUNTER — Ambulatory Visit
Admission: RE | Admit: 2020-04-27 | Discharge: 2020-04-27 | Disposition: A | Discharge status: Home / Self Care | Payer: Medicare HMO | Attending: General Surgery | Admitting: General Surgery

## 2020-04-27 ENCOUNTER — Ambulatory Visit: Payer: Medicare HMO | Admitting: Registered Nurse

## 2020-04-27 ENCOUNTER — Other Ambulatory Visit: Payer: Self-pay

## 2020-04-27 ENCOUNTER — Encounter
Admission: RE | Disposition: A | Discharge status: Home / Self Care | Payer: Self-pay | Source: Home / Self Care | Attending: General Surgery

## 2020-04-27 ENCOUNTER — Ambulatory Visit: Payer: Medicare HMO

## 2020-04-27 DIAGNOSIS — K8010 Calculus of gallbladder with chronic cholecystitis without obstruction: Secondary | ICD-10-CM | POA: Insufficient documentation

## 2020-04-27 DIAGNOSIS — M1990 Unspecified osteoarthritis, unspecified site: Secondary | ICD-10-CM | POA: Insufficient documentation

## 2020-04-27 DIAGNOSIS — Z791 Long term (current) use of non-steroidal anti-inflammatories (NSAID): Secondary | ICD-10-CM | POA: Diagnosis not present

## 2020-04-27 DIAGNOSIS — F419 Anxiety disorder, unspecified: Secondary | ICD-10-CM | POA: Diagnosis not present

## 2020-04-27 DIAGNOSIS — I429 Cardiomyopathy, unspecified: Secondary | ICD-10-CM | POA: Diagnosis not present

## 2020-04-27 DIAGNOSIS — F17210 Nicotine dependence, cigarettes, uncomplicated: Secondary | ICD-10-CM | POA: Diagnosis not present

## 2020-04-27 DIAGNOSIS — K219 Gastro-esophageal reflux disease without esophagitis: Secondary | ICD-10-CM | POA: Insufficient documentation

## 2020-04-27 DIAGNOSIS — Z79899 Other long term (current) drug therapy: Secondary | ICD-10-CM | POA: Insufficient documentation

## 2020-04-27 DIAGNOSIS — F329 Major depressive disorder, single episode, unspecified: Secondary | ICD-10-CM | POA: Insufficient documentation

## 2020-04-27 DIAGNOSIS — Z419 Encounter for procedure for purposes other than remedying health state, unspecified: Secondary | ICD-10-CM

## 2020-04-27 DIAGNOSIS — K2970 Gastritis, unspecified, without bleeding: Secondary | ICD-10-CM | POA: Diagnosis not present

## 2020-04-27 DIAGNOSIS — J449 Chronic obstructive pulmonary disease, unspecified: Secondary | ICD-10-CM | POA: Insufficient documentation

## 2020-04-27 HISTORY — PX: CHOLECYSTECTOMY: SHX55

## 2020-04-27 IMAGING — XA DG CHOLANGIOGRAM OPERATIVE
1 series · 4 of 4 positions shown · non-contrast
Comparison: Ultrasound [DATE]

CLINICAL DATA: Cholelithiasis

EXAM:
INTRAOPERATIVE CHOLANGIOGRAM
TECHNIQUE: Cholangiographic images from the C-arm fluoroscopic device were
submitted for interpretation post-operatively. Please see the
procedural report for the amount of contrast and the fluoroscopy
time utilized.

[Series 2: cont. · 4 of 100 frames shown]
[frame 1/100]
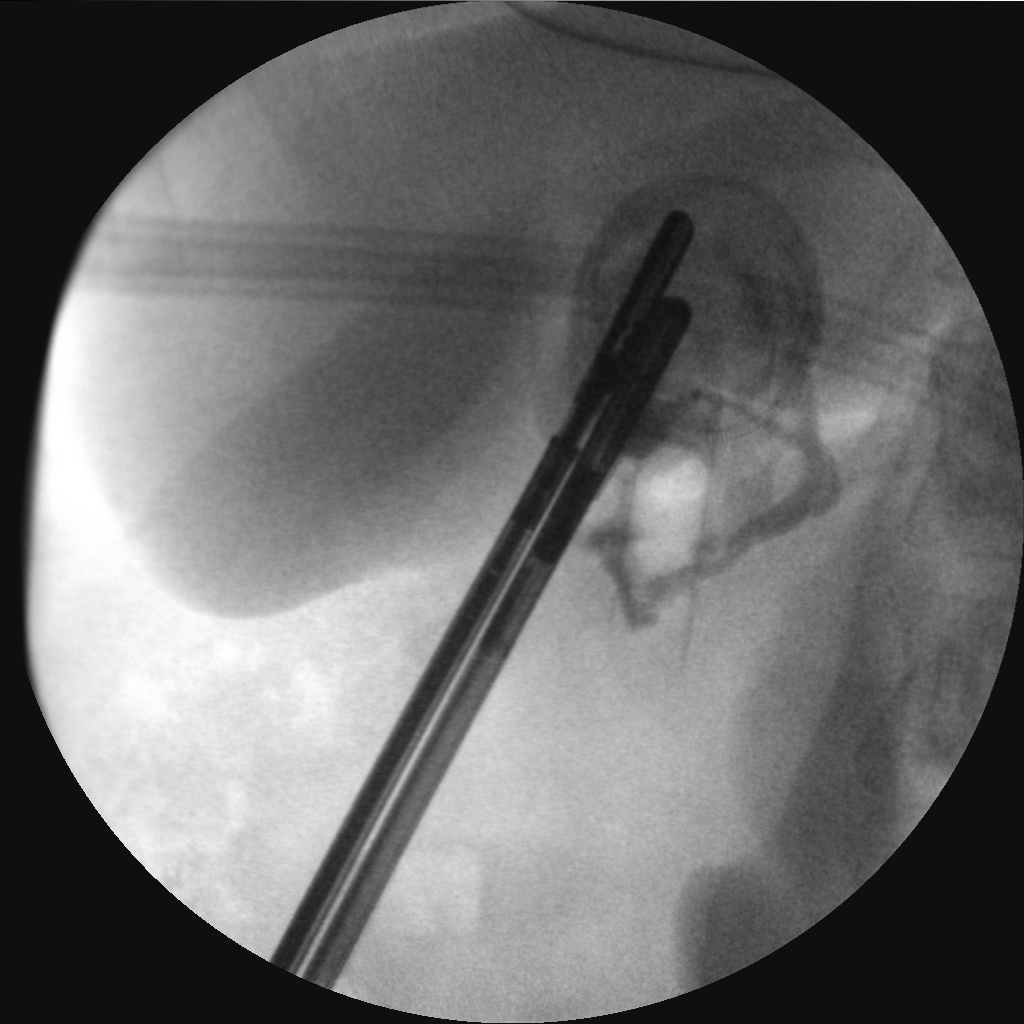
[frame 16/100]
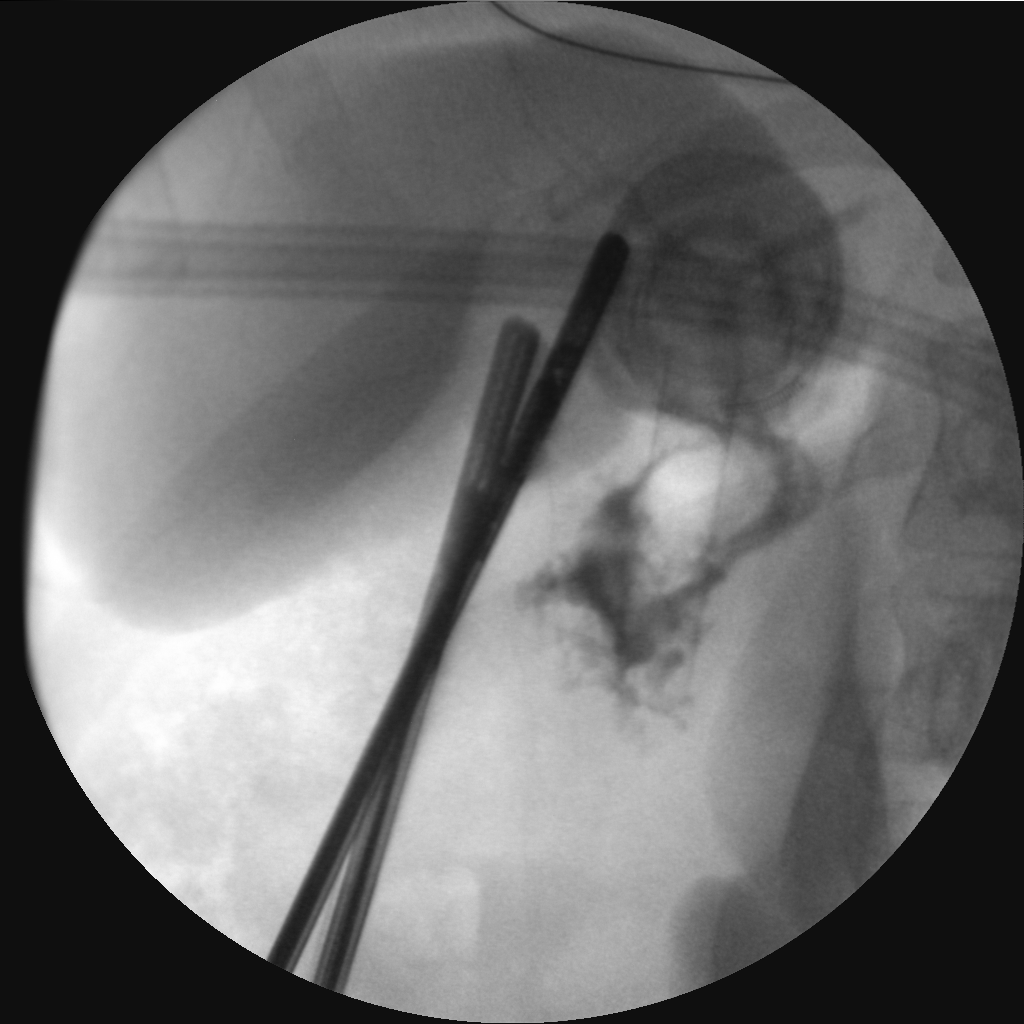
[frame 51/100]
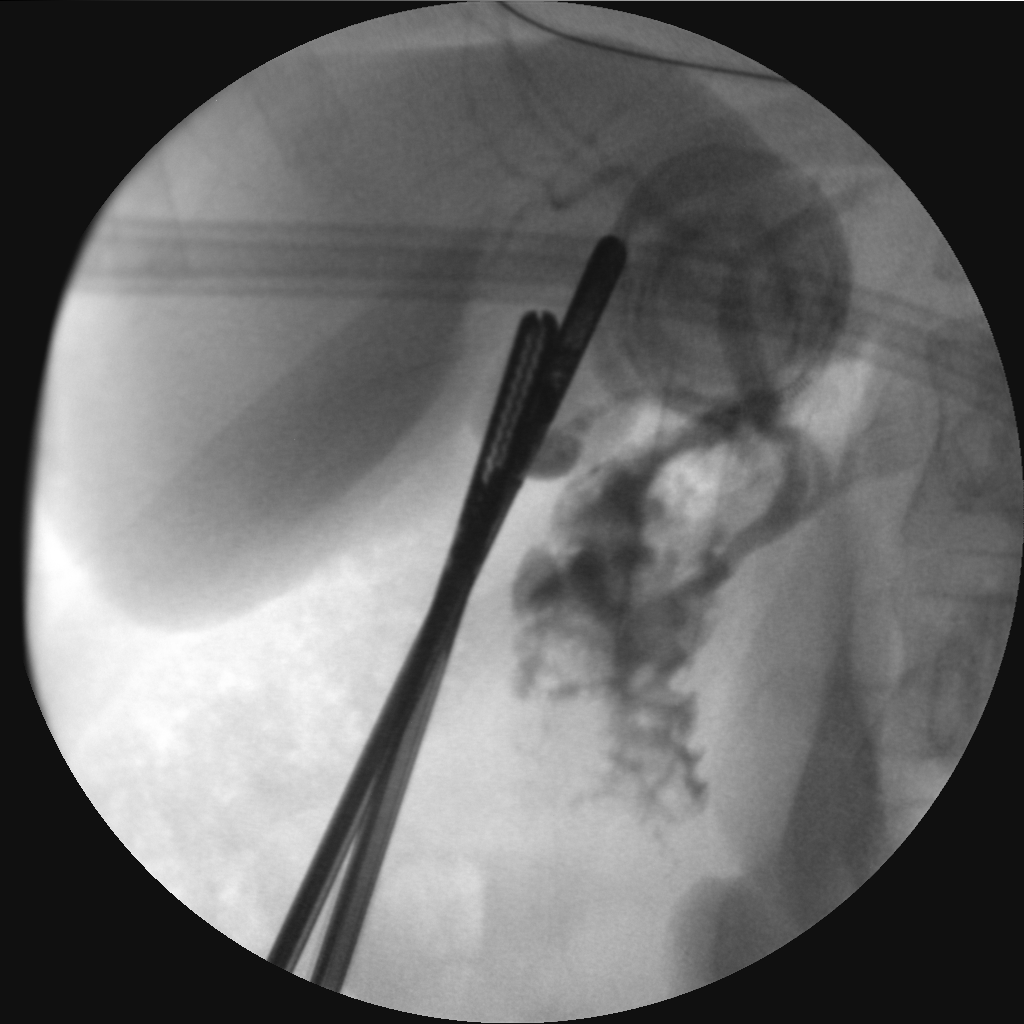
[frame 86/100]
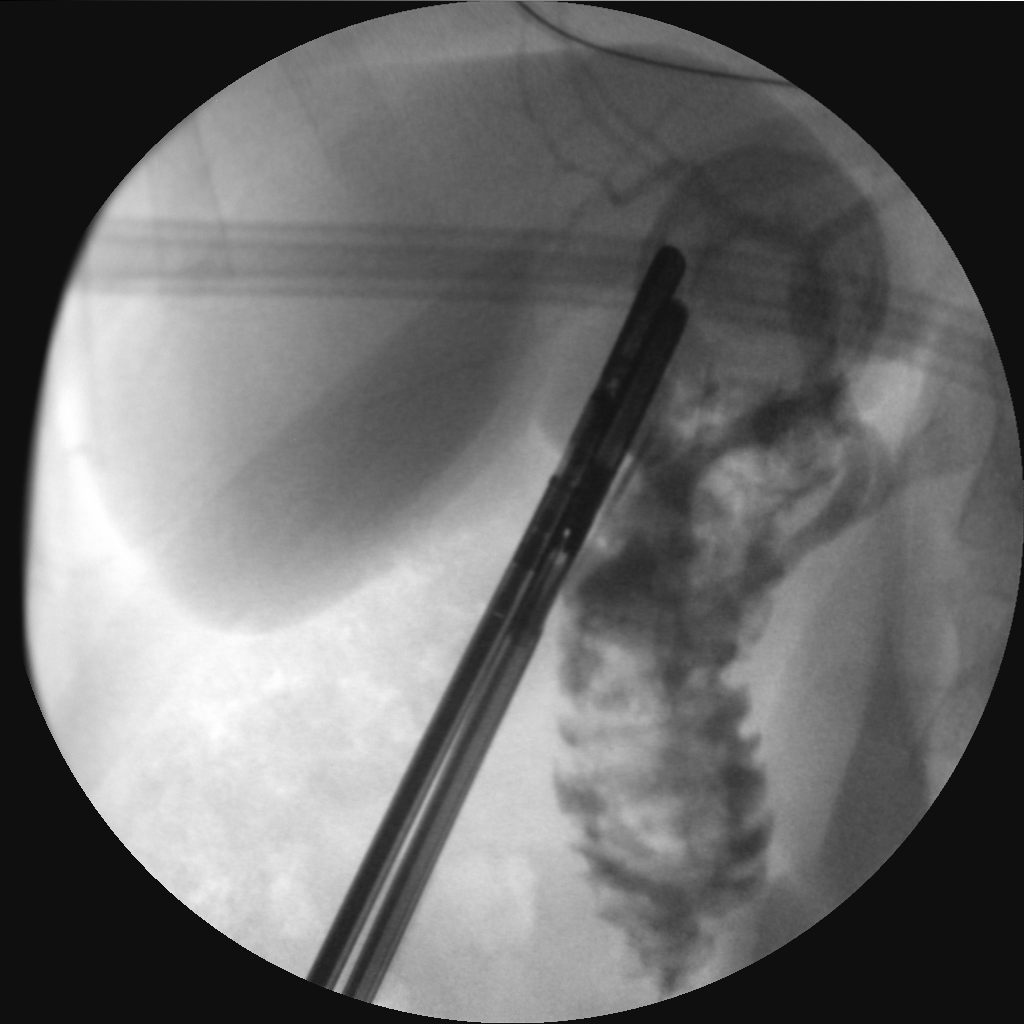

[4 of 4 positions shown; findings below may reference images not displayed]

FINDINGS: No persistent filling defects in the common duct. Intrahepatic ducts
are incompletely visualized, appearing decompressed centrally.
Contrast passes into the duodenum.

:
Negative for retained common duct stone.

## 2020-04-27 SURGERY — LAPAROSCOPIC CHOLECYSTECTOMY WITH INTRAOPERATIVE CHOLANGIOGRAM
Anesthesia: General

## 2020-04-27 MED ORDER — DEXMEDETOMIDINE HCL 200 MCG/2ML IV SOLN
INTRAVENOUS | Status: DC | PRN
  Administered 2020-04-27: 8 ug via INTRAVENOUS
  Administered 2020-04-27: 12 ug via INTRAVENOUS

## 2020-04-27 MED ORDER — LACTATED RINGERS IV SOLN: INTRAVENOUS | Status: DC

## 2020-04-27 MED ORDER — FENTANYL CITRATE (PF) 100 MCG/2ML IJ SOLN
INTRAMUSCULAR | Status: DC | PRN
  Administered 2020-04-27: 50 ug via INTRAVENOUS

## 2020-04-27 MED ORDER — ONDANSETRON HCL 4 MG/2ML IJ SOLN
INTRAMUSCULAR | Status: DC | PRN
  Administered 2020-04-27: 4 mg via INTRAVENOUS

## 2020-04-27 MED ORDER — MIDAZOLAM HCL 2 MG/2ML IJ SOLN
INTRAMUSCULAR | Status: AC
  Filled 2020-04-27: qty 2

## 2020-04-27 MED ORDER — PHENYLEPHRINE HCL (PRESSORS) 10 MG/ML IV SOLN
INTRAVENOUS | Status: DC | PRN
  Administered 2020-04-27 (×2): 100 ug via INTRAVENOUS

## 2020-04-27 MED ORDER — KETOROLAC TROMETHAMINE 30 MG/ML IJ SOLN
INTRAMUSCULAR | Status: DC | PRN
  Administered 2020-04-27: 30 mg via INTRAVENOUS

## 2020-04-27 MED ORDER — ALBUTEROL SULFATE HFA 108 (90 BASE) MCG/ACT IN AERS
INHALATION_SPRAY | RESPIRATORY_TRACT | Status: AC
  Filled 2020-04-27: qty 6.7

## 2020-04-27 MED ORDER — KETOROLAC TROMETHAMINE 30 MG/ML IJ SOLN
INTRAMUSCULAR | Status: AC
  Filled 2020-04-27: qty 1

## 2020-04-27 MED ORDER — ONDANSETRON HCL 4 MG/2ML IJ SOLN: 4.0000 mg | Freq: Once | INTRAMUSCULAR | Status: DC | PRN

## 2020-04-27 MED ORDER — GLYCOPYRROLATE 0.2 MG/ML IJ SOLN
INTRAMUSCULAR | Status: DC | PRN
  Administered 2020-04-27: .2 mg via INTRAVENOUS

## 2020-04-27 MED ORDER — SUGAMMADEX SODIUM 200 MG/2ML IV SOLN
INTRAVENOUS | Status: DC | PRN
  Administered 2020-04-27: 180 mg via INTRAVENOUS

## 2020-04-27 MED ORDER — ONDANSETRON HCL 4 MG/2ML IJ SOLN
INTRAMUSCULAR | Status: AC
  Filled 2020-04-27: qty 2

## 2020-04-27 MED ORDER — PROPOFOL 10 MG/ML IV BOLUS
INTRAVENOUS | Status: DC | PRN
  Administered 2020-04-27: 160 mg via INTRAVENOUS

## 2020-04-27 MED ORDER — ACETAMINOPHEN 10 MG/ML IV SOLN
INTRAVENOUS | Status: DC | PRN
  Administered 2020-04-27: 1000 mg via INTRAVENOUS

## 2020-04-27 MED ORDER — FENTANYL CITRATE (PF) 100 MCG/2ML IJ SOLN
INTRAMUSCULAR | Status: AC
  Filled 2020-04-27: qty 2

## 2020-04-27 MED ORDER — ALBUTEROL SULFATE HFA 108 (90 BASE) MCG/ACT IN AERS
INHALATION_SPRAY | RESPIRATORY_TRACT | Status: DC | PRN
  Administered 2020-04-27: 4 via RESPIRATORY_TRACT

## 2020-04-27 MED ORDER — KETAMINE HCL 10 MG/ML IJ SOLN
INTRAMUSCULAR | Status: DC | PRN
  Administered 2020-04-27: 20 mg via INTRAVENOUS

## 2020-04-27 MED ORDER — EPHEDRINE 5 MG/ML INJ
INTRAVENOUS | Status: AC
  Filled 2020-04-27: qty 10

## 2020-04-27 MED ORDER — CEFAZOLIN SODIUM-DEXTROSE 2-4 GM/100ML-% IV SOLN
2.0000 g | INTRAVENOUS | Status: AC
  Administered 2020-04-27: 2 g via INTRAVENOUS

## 2020-04-27 MED ORDER — CEFAZOLIN SODIUM-DEXTROSE 2-4 GM/100ML-% IV SOLN
INTRAVENOUS | Status: AC
  Filled 2020-04-27: qty 100

## 2020-04-27 MED ORDER — PROPOFOL 10 MG/ML IV BOLUS
INTRAVENOUS | Status: AC
  Filled 2020-04-27: qty 20

## 2020-04-27 MED ORDER — ACETAMINOPHEN 10 MG/ML IV SOLN
INTRAVENOUS | Status: AC
  Filled 2020-04-27: qty 100

## 2020-04-27 MED ORDER — SODIUM CHLORIDE (PF) 0.9 % IJ SOLN
INTRAMUSCULAR | Status: AC
  Filled 2020-04-27: qty 50

## 2020-04-27 MED ORDER — SODIUM CHLORIDE 0.9 % IV SOLN
INTRAVENOUS | Status: DC | PRN
  Administered 2020-04-27: 7 mL

## 2020-04-27 MED ORDER — LIDOCAINE HCL (CARDIAC) PF 100 MG/5ML IV SOSY
PREFILLED_SYRINGE | INTRAVENOUS | Status: DC | PRN
  Administered 2020-04-27: 60 mg via INTRAVENOUS

## 2020-04-27 MED ORDER — ROCURONIUM BROMIDE 10 MG/ML (PF) SYRINGE
PREFILLED_SYRINGE | INTRAVENOUS | Status: AC
  Filled 2020-04-27: qty 10

## 2020-04-27 MED ORDER — HYDROCODONE-ACETAMINOPHEN 5-325 MG PO TABS
1.0000 | ORAL_TABLET | ORAL | 0 refills | Status: DC | PRN
Start: 2020-04-27 — End: 2020-07-23

## 2020-04-27 MED ORDER — MIDAZOLAM HCL 2 MG/2ML IJ SOLN
INTRAMUSCULAR | Status: DC | PRN
  Administered 2020-04-27: 2 mg via INTRAVENOUS

## 2020-04-27 MED ORDER — DEXAMETHASONE SODIUM PHOSPHATE 10 MG/ML IJ SOLN
INTRAMUSCULAR | Status: AC
  Filled 2020-04-27: qty 1

## 2020-04-27 MED ORDER — LIDOCAINE HCL (PF) 2 % IJ SOLN
INTRAMUSCULAR | Status: AC
  Filled 2020-04-27: qty 5

## 2020-04-27 MED ORDER — GLYCOPYRROLATE 0.2 MG/ML IJ SOLN
INTRAMUSCULAR | Status: AC
  Filled 2020-04-27: qty 1

## 2020-04-27 MED ORDER — EPHEDRINE SULFATE 50 MG/ML IJ SOLN
INTRAMUSCULAR | Status: DC | PRN
  Administered 2020-04-27: 10 mg via INTRAVENOUS

## 2020-04-27 MED ORDER — DEXAMETHASONE SODIUM PHOSPHATE 10 MG/ML IJ SOLN
INTRAMUSCULAR | Status: DC | PRN
  Administered 2020-04-27: 8 mg via INTRAVENOUS

## 2020-04-27 MED ORDER — ROCURONIUM BROMIDE 100 MG/10ML IV SOLN
INTRAVENOUS | Status: DC | PRN
  Administered 2020-04-27: 40 mg via INTRAVENOUS

## 2020-04-27 SURGICAL SUPPLY — 38 items
APPLIER CLIP ROT 10 11.4 M/L (STAPLE) ×2
BLADE SURG 11 STRL SS SAFETY (MISCELLANEOUS) ×2 IMPLANT
CANISTER SUCT 1200ML W/VALVE (MISCELLANEOUS) ×2 IMPLANT
CANNULA DILATOR 10 W/SLV (CANNULA) ×2 IMPLANT
CANNULA DILATOR 5 W/SLV (CANNULA) ×4 IMPLANT
CATH CHOLANG 76X19 KUMAR (CATHETERS) ×2 IMPLANT
CHLORAPREP W/TINT 26 (MISCELLANEOUS) ×2 IMPLANT
CLIP APPLIE ROT 10 11.4 M/L (STAPLE) ×1 IMPLANT
CONRAY 60ML FOR OR (MISCELLANEOUS) ×2 IMPLANT
COVER WAND RF STERILE (DRAPES) ×2 IMPLANT
DISSECTOR KITTNER STICK (MISCELLANEOUS) IMPLANT
DISSECTORS/KITTNER STICK (MISCELLANEOUS)
DRAPE 3/4 80X56 (DRAPES) ×2 IMPLANT
DRSG TEGADERM 2-3/8X2-3/4 SM (GAUZE/BANDAGES/DRESSINGS) ×8 IMPLANT
DRSG TELFA 4X3 1S NADH ST (GAUZE/BANDAGES/DRESSINGS) ×2 IMPLANT
ELECT REM PT RETURN 9FT ADLT (ELECTROSURGICAL) ×2
ELECTRODE REM PT RTRN 9FT ADLT (ELECTROSURGICAL) ×1 IMPLANT
GLOVE BIO SURGEON STRL SZ7.5 (GLOVE) ×2 IMPLANT
GLOVE INDICATOR 8.0 STRL GRN (GLOVE) ×2 IMPLANT
GOWN STRL REUS W/ TWL LRG LVL3 (GOWN DISPOSABLE) ×3 IMPLANT
GOWN STRL REUS W/TWL LRG LVL3 (GOWN DISPOSABLE) ×3
IRRIGATION STRYKERFLOW (MISCELLANEOUS) ×1 IMPLANT
IRRIGATOR STRYKERFLOW (MISCELLANEOUS) ×2
IV LACTATED RINGERS 1000ML (IV SOLUTION) ×2 IMPLANT
KIT TURNOVER KIT A (KITS) ×2 IMPLANT
LABEL OR SOLS (LABEL) ×2 IMPLANT
NDL INSUFF ACCESS 14 VERSASTEP (NEEDLE) ×2 IMPLANT
NS IRRIG 500ML POUR BTL (IV SOLUTION) ×2 IMPLANT
PACK LAP CHOLECYSTECTOMY (MISCELLANEOUS) ×2 IMPLANT
POUCH SPECIMEN RETRIEVAL 10MM (ENDOMECHANICALS) IMPLANT
SCISSORS METZENBAUM CVD 33 (INSTRUMENTS) ×2 IMPLANT
SET TUBE SMOKE EVAC HIGH FLOW (TUBING) ×2 IMPLANT
STRIP CLOSURE SKIN 1/2X4 (GAUZE/BANDAGES/DRESSINGS) ×2 IMPLANT
SUT VIC AB 0 CT2 27 (SUTURE) ×2 IMPLANT
SUT VIC AB 4-0 FS2 27 (SUTURE) ×2 IMPLANT
SWABSTK COMLB BENZOIN TINCTURE (MISCELLANEOUS) ×2 IMPLANT
TROCAR XCEL NON-BLD 11X100MML (ENDOMECHANICALS) ×2 IMPLANT
WATER STERILE IRR 1000ML POUR (IV SOLUTION) ×2 IMPLANT

## 2020-04-27 NOTE — H&P (Signed)
Theresa Chaney [id number] [birth year]     HPI: This 65 woman has been troubled with epigastric pain.  Recent upper endoscopy showed gastritis but no evidence of H. pylori infection.  Previous ultrasound showed evidence of cholelithiasis without frank cholecystitis.  She is brought to the operating at this time for planned cholecystectomy.  Medications Prior to Admission  Medication Sig Dispense Refill Last Dose  . albuterol (VENTOLIN HFA) 108 (90 Base) MCG/ACT inhaler Inhale 2 puffs into the lungs every 6 (six) hours as needed for wheezing or shortness of breath.    04/27/2020 at Unknown time  . buPROPion (WELLBUTRIN SR) 150 MG 12 hr tablet Take 150 mg by mouth 2 (two) times daily.   04/27/2020 at Unknown time  . Calcium Carbonate-Vitamin D3 (CALCIUM 600/VITAMIN D) 600-400 MG-UNIT TABS Take 2 tablets by mouth daily. 180 tablet 0 04/24/2020  . cholecalciferol (VITAMIN D3) 25 MCG (1000 UNIT) tablet Take 1 tablet (1,000 Units total) by mouth daily. 90 tablet 0 04/24/2020  . cyanocobalamin (,VITAMIN B-12,) 1000 MCG/ML injection Inject 1,000 mcg into the skin every 30 (thirty) days.   04/24/2020  . cyclobenzaprine (FLEXERIL) 5 MG tablet Take 5 mg by mouth 3 (three) times daily.   04/24/2020  . escitalopram (LEXAPRO) 20 MG tablet Take 20 mg by mouth every morning.    04/27/2020 at Unknown time  . esomeprazole (NEXIUM) 40 MG capsule Take 40 mg by mouth every morning.    04/27/2020 at Unknown time  . folic acid (FOLVITE) 1 MG tablet Take 1 mg by mouth daily.   04/24/2020  . ibuprofen (ADVIL) 800 MG tablet Take 800 mg by mouth every 8 (eight) hours as needed for moderate pain.    04/24/2020  . letrozole (FEMARA) 2.5 MG tablet Take 2.5 mg by mouth every morning.    04/27/2020 at Unknown time  . loratadine (CLARITIN) 10 MG tablet Take 10 mg by mouth every morning.    04/27/2020 at Unknown time  . LORazepam (ATIVAN) 1 MG tablet Take 1 mg by mouth 3 (three) times daily.   04/27/2020 at Unknown time  . Magnesium 400 MG  TABS Take 400 mg by mouth 2 (two) times daily.   04/24/2020  . meloxicam (MOBIC) 7.5 MG tablet Take 7.5 mg by mouth 2 (two) times a day.   04/24/2020  . methotrexate (RHEUMATREX) 2.5 MG tablet Take 20 mg by mouth every Sunday.    04/26/2020 at Unknown time  . metoprolol succinate (TOPROL-XL) 25 MG 24 hr tablet Take 37.5 mg by mouth every morning.    04/27/2020 at Unknown time  . nortriptyline (PAMELOR) 10 MG capsule Take 30 mg by mouth at bedtime.    04/26/2020 at Unknown time  . phentermine (ADIPEX-P) 37.5 MG tablet Take 37.5 mg by mouth daily before breakfast.    04/24/2020  . pregabalin (LYRICA) 300 MG capsule Take 1 capsule (300 mg total) by mouth daily. (Patient taking differently: Take 300 mg by mouth every morning. ) 30 capsule 3 04/27/2020 at Unknown time  . promethazine (PHENERGAN) 25 MG tablet Take 1 tablet (25 mg total) by mouth every 8 (eight) hours as needed for nausea or vomiting. 90 tablet 0 Past Week at Unknown time  . pyridOXINE (VITAMIN B-6) 100 MG tablet Take 100 mg by mouth daily.   04/24/2020  . topiramate (TOPAMAX) 50 MG tablet Take 50 mg by mouth 2 (two) times daily.    04/27/2020 at Unknown time  . vitamin C (ASCORBIC ACID) 500 MG tablet Take  500 mg by mouth daily.   04/24/2020   No Known Allergies Past Medical History:  Diagnosis Date  . Anemia   . Arthritis   . BRCA negative 11/26/2017  . Breast cancer ([place]) 10/11/2017   Multifocal, ER positive, PR negative, HER-2 negative. ypT3 ypN2a 8.7 cm, 4/15 nodes  . Cardiomyopathy ([place])    a. 10/2017 Echo: EF 60-65%, no rwma, Gr1 DD, nl RV size/fxn; b. 04/2018 Echo: EF 55-60%, no rwma, Nl RV size/fxn; c. 08/2018 Echo: EF 45%, diff HK, ? HK of antsept wall. Gr1 DD. Mild MR. Mild LAE/RAE. Mod dil RV.   [name] Chronic bronchitis ([place]) 11/2017  . COPD (chronic obstructive pulmonary disease) ([place])    MILD PER CXR  . Depression   . Family history of cancer   . GERD (gastroesophageal reflux disease)   . Headache    MIGRAINES  . Heart murmur     ASYMPTOMATIC  . Personal history of chemotherapy    current for right breast ca   Past Surgical History:  Procedure Laterality Date  . AXILLARY LYMPH NODE DISSECTION Right 03/19/2018   Procedure: AXILLARY LYMPH NODE DISSECTION;  Surgeon: Robert Bellow, MD;  Location: [facility];  Service: General;  Laterality: Right;  . BREAST BIOPSY Right 10/11/2017   12:30 posterior coil clip invasive mammary carcinoma  . BREAST BIOPSY Right 10/11/2017   11:30 middle depth ribbon clip DCIS  . BREAST BIOPSY Right 10/11/2017   5:30 anterior depth x shape invasive ductal carcinoma  . BREAST IMPLANT REMOVAL Right 06/03/2019   Procedure: REMOVAL OF RIGHT BREAST IMPLANTS;  Surgeon: Wallace Going, DO;  Location: [facility];  Service: Plastics;  Laterality: Right;  . BREAST RECONSTRUCTION WITH PLACEMENT OF TISSUE EXPANDER AND FLEX HD (ACELLULAR HYDRATED DERMIS) Right 03/19/2018   Procedure: BREAST RECONSTRUCTION WITH PLACEMENT OF TISSUE EXPANDER AND FLEX HD (ACELLULAR HYDRATED DERMIS);  Surgeon: Wallace Going, DO;  Location: [facility];  Service: Plastics;  Laterality: Right;  . ESOPHAGOGASTRODUODENOSCOPY (EGD) WITH PROPOFOL N/A 04/17/2020   Procedure: ESOPHAGOGASTRODUODENOSCOPY (EGD) WITH PROPOFOL;  Surgeon: Robert Bellow, MD;  Location: [facility];  Service: Endoscopy;  Laterality: N/A;  with biopsy  . LAPAROSCOPIC BILATERAL SALPINGO OOPHERECTOMY Bilateral 03/19/2018   Procedure: LAPAROSCOPIC BILATERAL SALPINGO OOPHORECTOMY;  Surgeon: Benjaman Kindler, MD;  Location: [facility];  Service: Gynecology;  Laterality: Bilateral;  . MASTECTOMY Right 03/2018  . MASTECTOMY W/ SENTINEL NODE BIOPSY Right 03/19/2018   Procedure: MASTECTOMY WITH SENTINEL LYMPH NODE BIOPSY;  Surgeon: Robert Bellow, MD;  Location: [facility];  Service: General;  Laterality: Right;  . PORT-A-CATH REMOVAL Left 06/03/2019   Procedure: REMOVAL PORT-A-CATH;  Surgeon: Robert Bellow, MD;  Location: [facility];  Service: General;   Laterality: Left;  . PORTACATH PLACEMENT Left 10/24/2017   Procedure: INSERTION PORT-A-CATH;  Surgeon: Robert Bellow, MD;  Location: [facility];  Service: General;  Laterality: Left;  . REMOVAL OF TISSUE EXPANDER AND PLACEMENT OF IMPLANT Right 07/20/2018   Procedure: REMOVAL OF RIGHT BREAST TISSUE EXPANDER AND PLACEMENT OF IMPLANT;  Surgeon: Wallace Going, DO;  Location: [place];  Service: Plastics;  Laterality: Right;  . SIMPLE MASTECTOMY WITH AXILLARY SENTINEL NODE BIOPSY Left 06/03/2019   Procedure: SIMPLE MASTECTOMY LEFT;  Surgeon: Robert Bellow, MD;  Location: [facility];  Service: General;  Laterality: Left;   Social History   Socioeconomic History  . Marital status: Married    Spouse name: Not on file  . Number of children: Not on file  .  Years of education: Not on file  . Highest education level: Not on file  Occupational History  . Occupation: Occupational psychologist    Comment: Therapist, art   Tobacco Use  . Smoking status: Current Every Day Smoker    Packs/day: 0.50    Years: 18.00    Pack years: 9.00    Types: Cigarettes    Start date: 06/21/2018  . Smokeless tobacco: Never Used  Substance and Sexual Activity  . Alcohol use: No  . Drug use: No  . Sexual activity: Yes    Birth control/protection: Injection  Other Topics Concern  . Not on file  Social History Narrative  . Not on file   Social Determinants of Health   Financial Resource Strain:   . Difficulty of Paying Living Expenses:   Food Insecurity:   . Worried About Charity fundraiser in the Last Year:   . Arboriculturist in the Last Year:   Transportation Needs:   . Film/video editor (Medical):   [name] Lack of Transportation (Non-Medical):   Physical Activity:   . Days of Exercise per Week:   . Minutes of Exercise per Session:   Stress:   . Feeling of Stress :   Social Connections:   . Frequency of Communication with Friends and Family:   . Frequency of  Social Gatherings with Friends and Family:   . Attends Religious Services:   . Active Member of Clubs or Organizations:   . Attends Archivist Meetings:   [name] Marital Status:   Intimate Partner Violence:   . Fear of Current or Ex-Partner:   . Emotionally Abused:   [name] Physically Abused:   . Sexually Abused:    Social History   Social History Narrative  . Not on file     ROS: Negative.     PE: HEENT: Negative. Lungs: Clear. Cardio: RR.   Assessment/Plan:  Proceed with planned cholecystectomy.  Forest Gleason [facility] 04/27/2020

## 2020-04-27 NOTE — Anesthesia Postprocedure Evaluation (Signed)
Anesthesia Post Note  Patient: Theresa Chaney  Procedure(s) Performed: LAPAROSCOPIC CHOLECYSTECTOMY WITH INTRAOPERATIVE CHOLANGIOGRAM (N/A )  Patient location during evaluation: PACU Anesthesia Type: General Level of consciousness: awake and alert Pain management: pain level controlled Vital Signs Assessment: post-procedure vital signs reviewed and stable Respiratory status: spontaneous breathing, nonlabored ventilation, respiratory function stable and patient connected to nasal cannula oxygen Cardiovascular status: blood pressure returned to baseline and stable Postop Assessment: no apparent nausea or vomiting Anesthetic complications: no     Last Vitals:  Vitals:   04/27/20 1215 04/27/20 1230  BP: 95/60 96/63  Pulse: 80 78  Resp: 20 19  Temp:    SpO2: 95% 96%    Last Pain:  Vitals:   04/27/20 1230  TempSrc:   PainSc: 0-No pain                 Molli Barrows

## 2020-04-27 NOTE — Op Note (Signed)
Preoperative diagnosis: Chronic cholecystitis, epigastric pain.  Cholelithiasis.  Postoperative diagnosis: Same.  Operative procedure: Laparoscopic cholecystectomy with intraoperative cholangiograms.  Operating surgeon: Charlie Pitter, MD.  Assistant Arvilla Meres, RNFA.  Anesthesia: General endotracheal.  Estimated blood loss: Less than 5 cc.  Clinical note: This 39 year old woman is but troubled by epigastric pain, some in the postprandial period.  Recent upper endoscopy showed only gastritis.  Prior ultrasound showed evidence of cholelithiasis.  She was felt to be a candidate for cholecystectomy based on her age and symptoms.  She received Ancef prior to the procedure due to her longstanding symptoms.  SCD stockings for DVT prevention.  Operative note: With the patient under adequate general anesthesia the abdomen was cleansed with ChloraPrep and draped.  With the patient in Trendelenburg position a varies needle was placed through a transumbilical incision.  After assuring intra-abdominal location with a hanging drop test the abdomen was insufflated with CO2 at 10 mmHg pressure.  A 10 mm Neck step port was expanded and inspection showed no evidence of injury from initial port placement.  The patient was placed in reverse Trendelenburg position and rolled to the left.  An 11 mm XL port was placed in the epigastrium followed by 2-5 mm Step ports.  The gallbladder was shown to have color change from robin's egg blue to a creamy color, no adhesions.  The duodenum adjacent was unremarkable.  Visualized portion of the stomach was unremarkable.  Slight rounding of the liver edge.  The gallbladder was placed on cephalad traction.  The neck of the gallbladder was cleared and the cystic duct and cystic artery identified.  Due to her longstanding symptoms it was elected to complete cholangiograms.  This was completed with 7 cc of contrast with prompt filling of the duodenum which obscured much of the anatomy  of the area.  It was possible identify the cystic duct, right and left hepatic ducts, common hepatic duct and common bile duct without evidence of stone defect.  The cystic duct and cystic artery were doubly clipped and divided.  The gallbladder was removed from the liver bed making use of hook cautery dissection.  It was delivered without incident through the umbilical port site.  Inspection from the epigastric site showed no evidence of injury from initial port placement.  The right upper quadrant was irrigated with lactated Ringer solution.  Good hemostasis was noted.  The abdomen was desufflated and ports removed under direct vision.  Skin incisions were closed with 4-0 Vicryl subcuticular suture.  Benzoin, Steri-Strips, Telfa and Tegaderm dressings were applied.  The patient tolerated the procedure well and was brought to the recovery room in stable condition.

## 2020-04-27 NOTE — Anesthesia Preprocedure Evaluation (Signed)
Anesthesia Evaluation  Patient identified by MRN, date of birth, ID band Patient awake    Reviewed: Allergy & Precautions, H&P , NPO status , Patient's Chart, lab work & pertinent test results, reviewed documented beta blocker date and time   Airway Mallampati: III  TM Distance: >3 FB Neck ROM: full    Dental  (+) Teeth Intact   Pulmonary shortness of breath, COPD, Current Smoker,    Pulmonary exam normal        Cardiovascular Exercise Tolerance: Good Normal cardiovascular exam+ Valvular Problems/Murmurs  Rhythm:regular Rate:Normal     Neuro/Psych  Headaches, PSYCHIATRIC DISORDERS Anxiety Depression    GI/Hepatic Neg liver ROS, GERD  ,  Endo/Other  negative endocrine ROS  Renal/GU negative Renal ROS  negative genitourinary   Musculoskeletal   Abdominal   Peds  Hematology  (+) Blood dyscrasia, anemia ,   Anesthesia Other Findings Past Medical History: No date: Anemia No date: Arthritis 11/26/2017: BRCA negative 10/11/2017: Breast cancer ([place])     Comment:  Multifocal, ER positive, PR negative, HER-2 negative.               ypT3 ypN2a 8.7 cm, 4/15 nodes No date: Cardiomyopathy [facility])     Comment:  a. 10/2017 Echo: EF 60-65%, no rwma, Gr1 DD, nl RV               size/fxn; b. 04/2018 Echo: EF 55-60%, no rwma, Nl RV               size/fxn; c. 08/2018 Echo: EF 45%, diff HK, ? HK of               antsept wall. Gr1 DD. Mild MR. Mild LAE/RAE. Mod dil RV.  11/2017: Chronic bronchitis ([place]) No date: COPD (chronic obstructive pulmonary disease) ([facility])     Comment:  MILD PER CXR No date: Depression No date: Family history of cancer No date: GERD (gastroesophageal reflux disease) No date: Headache     Comment:  MIGRAINES No date: Heart murmur     Comment:  ASYMPTOMATIC No date: Personal history of chemotherapy     Comment:  current for right breast ca Past Surgical History: 03/19/2018: AXILLARY LYMPH NODE DISSECTION;  Right     Comment:  Procedure: AXILLARY LYMPH NODE DISSECTION;  Surgeon:               Robert Bellow, MD;  Location: [facility];  Service:               General;  Laterality: Right; 10/11/2017: BREAST BIOPSY; Right     Comment:  12:30 posterior coil clip invasive mammary carcinoma 10/11/2017: BREAST BIOPSY; Right     Comment:  11:30 middle depth ribbon clip DCIS 10/11/2017: BREAST BIOPSY; Right     Comment:  5:30 anterior depth x shape invasive ductal carcinoma 06/03/2019: BREAST IMPLANT REMOVAL; Right     Comment:  Procedure: REMOVAL OF RIGHT BREAST IMPLANTS;  Surgeon:               Wallace Going, DO;  Location: [facility];  Service:               Plastics;  Laterality: Right; 03/19/2018: BREAST RECONSTRUCTION WITH PLACEMENT OF TISSUE EXPANDER AND  FLEX HD (ACELLULAR HYDRATED DERMIS); Right     Comment:  Procedure: BREAST RECONSTRUCTION WITH PLACEMENT OF               TISSUE EXPANDER AND FLEX HD (ACELLULAR HYDRATED DERMIS);  Surgeon: Wallace Going, DO;  Location: [facility];                Service: Plastics;  Laterality: Right; 04/17/2020: ESOPHAGOGASTRODUODENOSCOPY (EGD) WITH PROPOFOL; N/A     Comment:  Procedure: ESOPHAGOGASTRODUODENOSCOPY (EGD) WITH               PROPOFOL;  Surgeon: Robert Bellow, MD;  Location:               [facility];  Service: Endoscopy;  Laterality: N/A;                with biopsy 03/19/2018: LAPAROSCOPIC BILATERAL SALPINGO OOPHERECTOMY; Bilateral     Comment:  Procedure: LAPAROSCOPIC BILATERAL SALPINGO OOPHORECTOMY;              Surgeon: Benjaman Kindler, MD;  Location: [facility];                Service: Gynecology;  Laterality: Bilateral; 03/2018: MASTECTOMY; Right 03/19/2018: MASTECTOMY W/ SENTINEL NODE BIOPSY; Right     Comment:  Procedure: MASTECTOMY WITH SENTINEL LYMPH NODE BIOPSY;                Surgeon: Robert Bellow, MD;  Location: [facility];                Service: General;  Laterality: Right; 06/03/2019: PORT-A-CATH  REMOVAL; Left     Comment:  Procedure: REMOVAL PORT-A-CATH;  Surgeon: Robert Bellow, MD;  Location: [facility];  Service: General;                Laterality: Left; 10/24/2017: PORTACATH PLACEMENT; Left     Comment:  Procedure: INSERTION PORT-A-CATH;  Surgeon: Robert Bellow, MD;  Location: [facility];  Service: General;                Laterality: Left; 07/20/2018: REMOVAL OF TISSUE EXPANDER AND PLACEMENT OF IMPLANT; Right     Comment:  Procedure: REMOVAL OF RIGHT BREAST TISSUE EXPANDER AND               PLACEMENT OF IMPLANT;  Surgeon: Wallace Going, DO;              Location: [place];  Service: Plastics;               Laterality: Right; 06/03/2019: SIMPLE MASTECTOMY WITH AXILLARY SENTINEL NODE BIOPSY; Left     Comment:  Procedure: SIMPLE MASTECTOMY LEFT;  Surgeon: Robert Bellow, MD;  Location: [facility];  Service: General;                Laterality: Left;   Reproductive/Obstetrics negative OB ROS                             Anesthesia Physical Anesthesia Plan  ASA: III  Anesthesia Plan: General ETT   Post-op Pain Management:    Induction:   PONV Risk Score and Plan: 3  Airway Management Planned:   Additional Equipment:   Intra-op Plan:   Post-operative Plan:   Informed Consent: I have reviewed the patients History and Physical, chart, labs and discussed the procedure including the risks, benefits and alternatives for the proposed  anesthesia with the patient or authorized representative who has indicated his/her understanding and acceptance.     Dental Advisory Given  Plan Discussed with: CRNA  Anesthesia Plan Comments:         Anesthesia Quick Evaluation

## 2020-04-27 NOTE — Transfer of Care (Signed)
Immediate Anesthesia Transfer of Care Note  Patient: Theresa Chaney  Procedure(s) Performed: LAPAROSCOPIC CHOLECYSTECTOMY WITH INTRAOPERATIVE CHOLANGIOGRAM (N/A )  Patient Location: PACU  Anesthesia Type:General  Level of Consciousness: drowsy  Airway & Oxygen Therapy: Patient Spontanous Breathing and Patient connected to face mask oxygen  Post-op Assessment: Report given to RN and Post -op Vital signs reviewed and stable  Post vital signs: Reviewed and stable  Last Vitals:  Vitals Value Taken Time  BP 108/62 04/27/20 1113  Temp    Pulse 78 04/27/20 1115  Resp 13 04/27/20 1115  SpO2 99 % 04/27/20 1115  Vitals shown include unvalidated device data.  Last Pain:  Vitals:   04/27/20 0739  TempSrc: Oral  PainSc: 0-No pain         Complications: No apparent anesthesia complications

## 2020-04-27 NOTE — Anesthesia Procedure Notes (Signed)
Procedure Name: Intubation Date/Time: 04/27/2020 10:13 AM Performed by: Lia Foyer, CRNA Pre-anesthesia Checklist: Patient identified, Emergency Drugs available, Suction available and Patient being monitored Patient Re-evaluated:Patient Re-evaluated prior to induction Oxygen Delivery Method: Circle system utilized Preoxygenation: Pre-oxygenation with 100% oxygen Induction Type: IV induction Ventilation: Mask ventilation without difficulty Laryngoscope Size: McGraph and 4 Grade View: Grade I Tube type: Oral Tube size: 7.0 mm Number of attempts: 1 Airway Equipment and Method: Stylet,  Oral airway and Video-laryngoscopy Placement Confirmation: ETT inserted through vocal cords under direct vision,  positive ETCO2 and breath sounds checked- equal and bilateral Secured at: 19 cm Tube secured with: Tape Dental Injury: Teeth and Oropharynx as per pre-operative assessment

## 2020-04-28 LAB — SURGICAL PATHOLOGY

## 2020-06-10 ENCOUNTER — Emergency Department: Payer: Medicare HMO

## 2020-06-10 ENCOUNTER — Other Ambulatory Visit: Payer: Self-pay

## 2020-06-10 ENCOUNTER — Emergency Department
Admission: EM | Admit: 2020-06-10 | Discharge: 2020-06-10 | Disposition: A | Discharge status: Home / Self Care | Payer: Medicare HMO | Attending: Emergency Medicine | Admitting: Emergency Medicine

## 2020-06-10 DIAGNOSIS — R0789 Other chest pain: Secondary | ICD-10-CM | POA: Insufficient documentation

## 2020-06-10 DIAGNOSIS — M5410 Radiculopathy, site unspecified: Secondary | ICD-10-CM | POA: Diagnosis not present

## 2020-06-10 DIAGNOSIS — F17210 Nicotine dependence, cigarettes, uncomplicated: Secondary | ICD-10-CM | POA: Insufficient documentation

## 2020-06-10 DIAGNOSIS — M545 Low back pain: Secondary | ICD-10-CM | POA: Diagnosis present

## 2020-06-10 LAB — CBC
HCT: 35.6 % — ABNORMAL LOW (ref 36.0–46.0)
Hemoglobin: 12 g/dL (ref 12.0–15.0)
MCH: 30.3 pg (ref 26.0–34.0)
MCHC: 33.7 g/dL (ref 30.0–36.0)
MCV: 89.9 fL (ref 80.0–100.0)
Platelets: 310 10*3/uL (ref 150–400)
RBC: 3.96 MIL/uL (ref 3.87–5.11)
RDW: 13.3 % (ref 11.5–15.5)
WBC: 9.8 10*3/uL (ref 4.0–10.5)
nRBC: 0 % (ref 0.0–0.2)

## 2020-06-10 LAB — BASIC METABOLIC PANEL
Anion gap: 8 (ref 5–15)
BUN: 19 mg/dL (ref 6–20)
CO2: 24 mmol/L (ref 22–32)
Calcium: 9.2 mg/dL (ref 8.9–10.3)
Chloride: 105 mmol/L (ref 98–111)
Creatinine, Ser: 0.92 mg/dL (ref 0.44–1.00)
GFR calc Af Amer: >60 mL/min (ref >60)
GFR calc non Af Amer: >60 mL/min (ref >60)
Glucose, Bld: 117 mg/dL — ABNORMAL HIGH (ref 70–99)
Potassium: 3.5 mmol/L (ref 3.5–5.1)
Sodium: 137 mmol/L (ref 135–145)

## 2020-06-10 LAB — TROPONIN I (HIGH SENSITIVITY)
Troponin I (High Sensitivity): 2 ng/L (ref <18)
Troponin I (High Sensitivity): 2 ng/L (ref <18)

## 2020-06-10 IMAGING — CT CT ANGIO CHEST
2 of 6 series · 18 of 46 positions shown · IV contrast (APPLIED)
Comparison: Chest radiograph earlier this day. Chest CT [DATE]

CLINICAL DATA: Shortness of breath. Back pain.

EXAM:
CT ANGIOGRAPHY CHEST WITH CONTRAST
TECHNIQUE: Multidetector CT imaging of the chest was performed using the
standard protocol during bolus administration of intravenous
contrast. Multiplanar CT image reconstructions and MIPs were
obtained to evaluate the vascular anatomy.
CONTRAST:  75mL OMNIPAQUE IOHEXOL 350 MG/ML SOLN

[Series 5: thins · axial · 0.67mm/px · z∈[-640,-387]mm · 15 of 279 slices shown]
[im 13/279  lung]
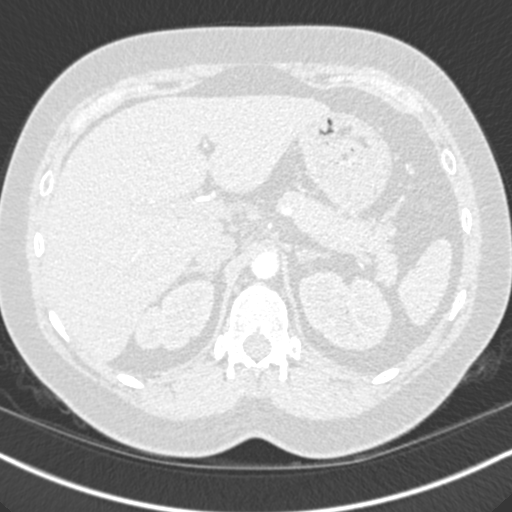
[im 37/279  soft-tissue]
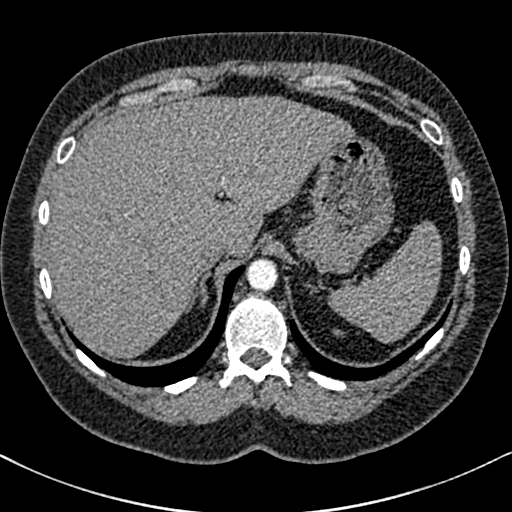
[im 49/279  lung]
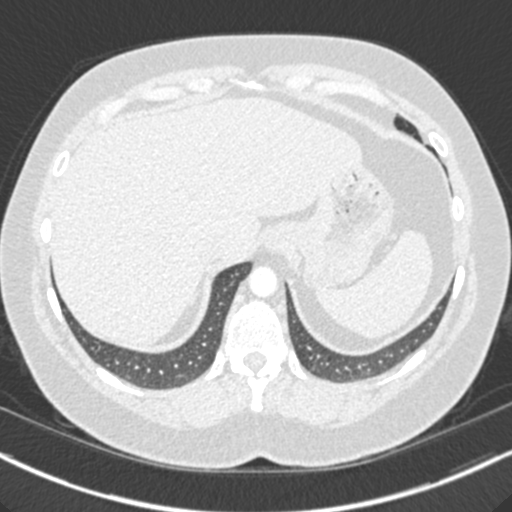
[im 73/279  soft-tissue]
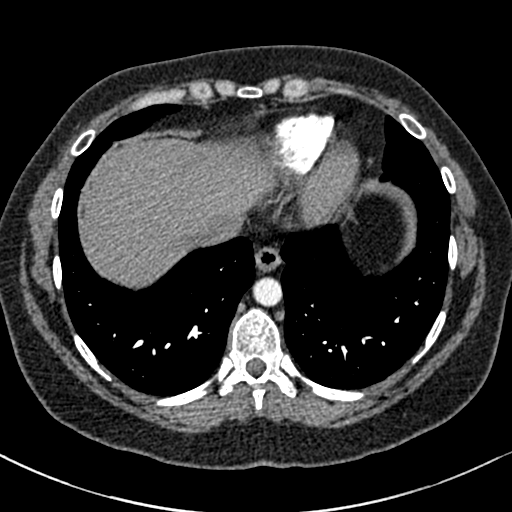
[im 85/279  lung]
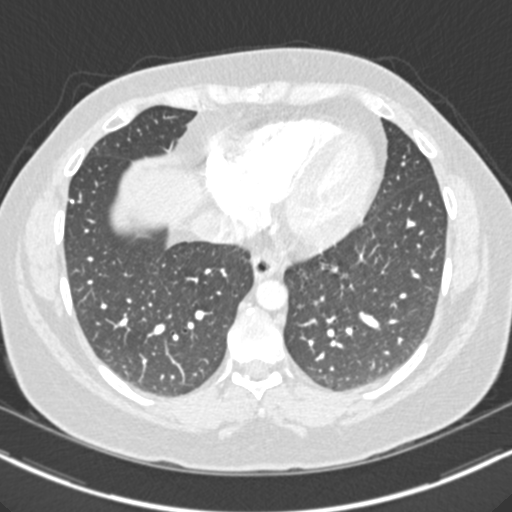
[im 109/279  soft-tissue]
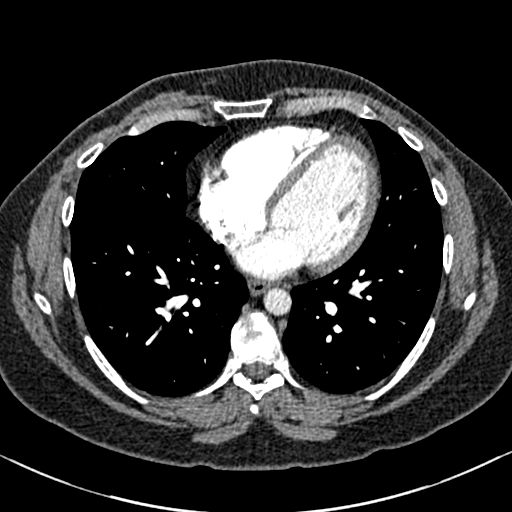
[im 121/279  lung]
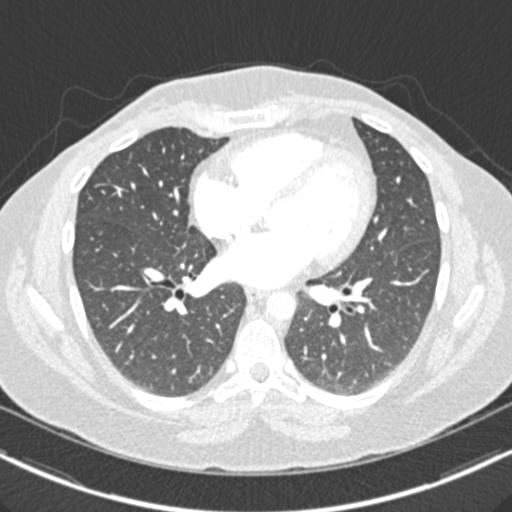
[im 146/279  soft-tissue]
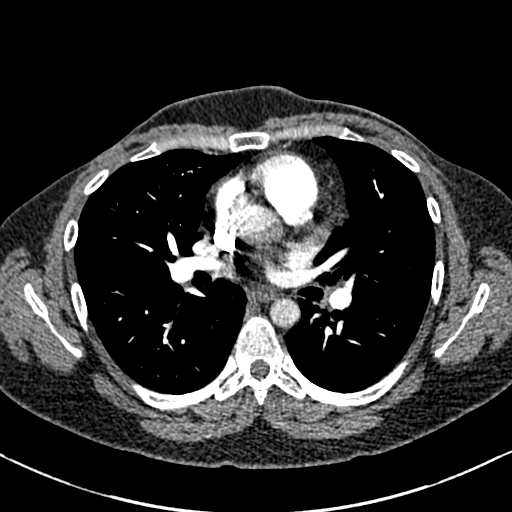
[im 158/279  lung]
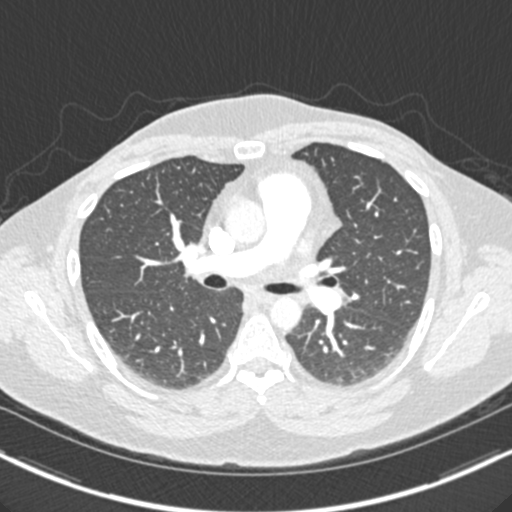
[im 170/279  soft-tissue]
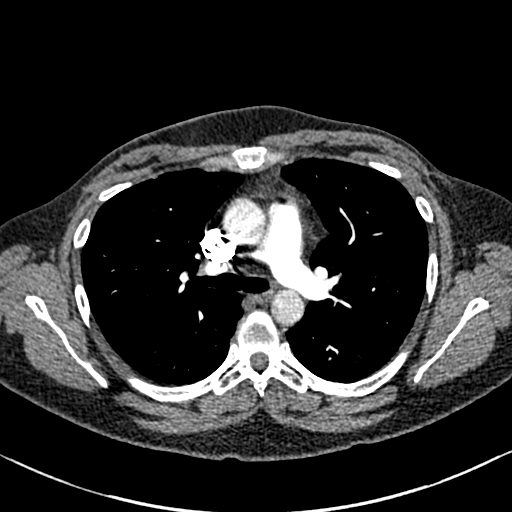
[im 194/279  lung]
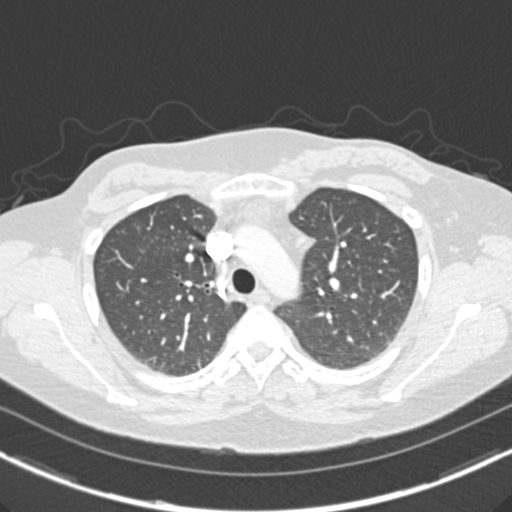
[im 206/279  soft-tissue]
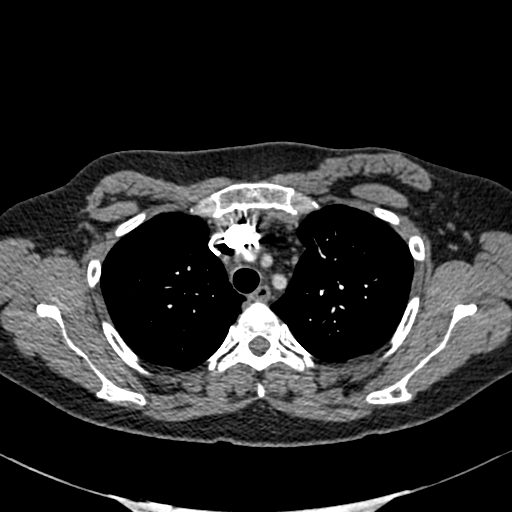
[im 230/279  lung]
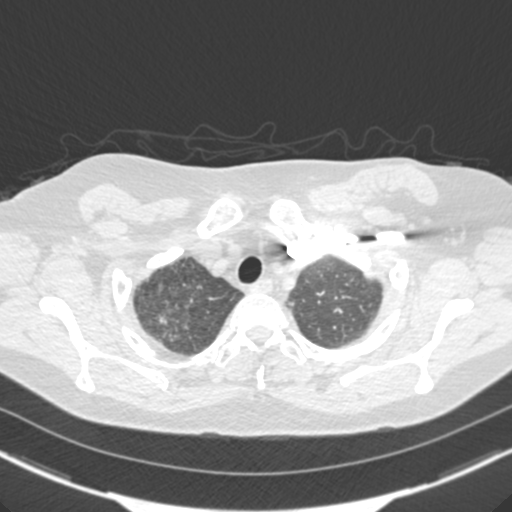
[im 242/279  soft-tissue]
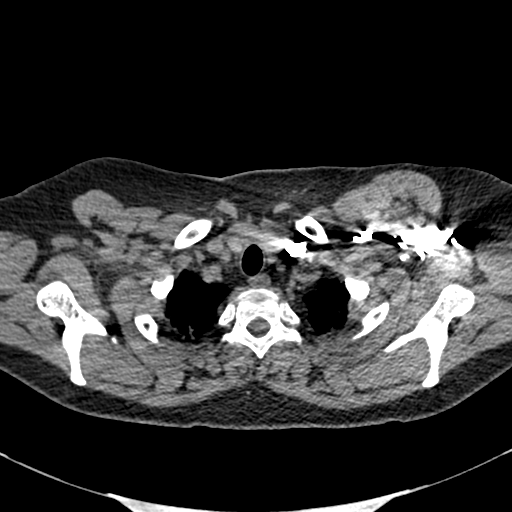
[im 266/279  lung]
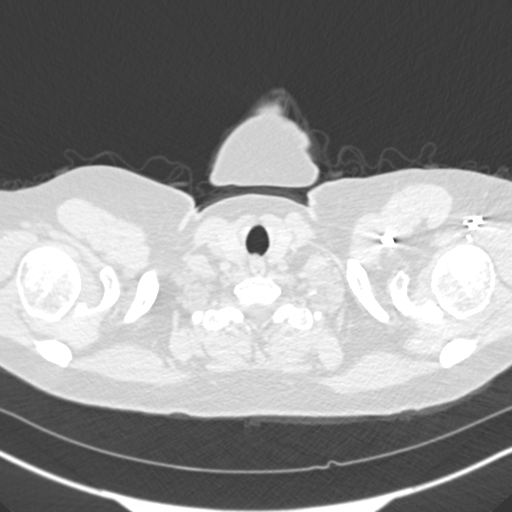

[Series 7: coronal mpr · coronal · 0.54mm/px · 3 of 84 slices shown]
[im 21/84  soft-tissue]
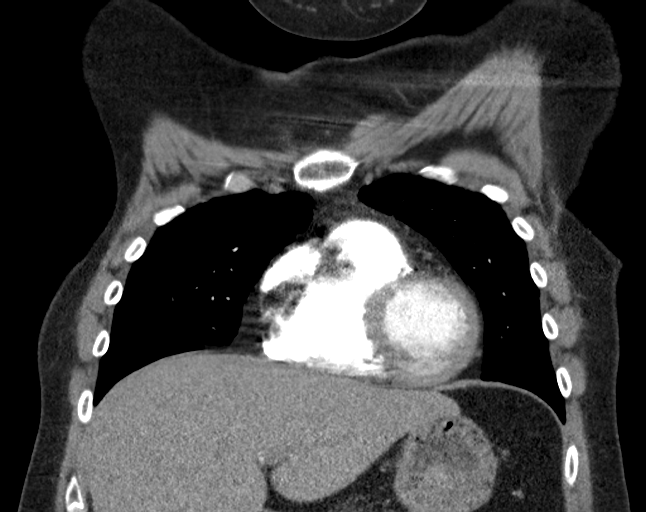
[im 42/84  soft-tissue]
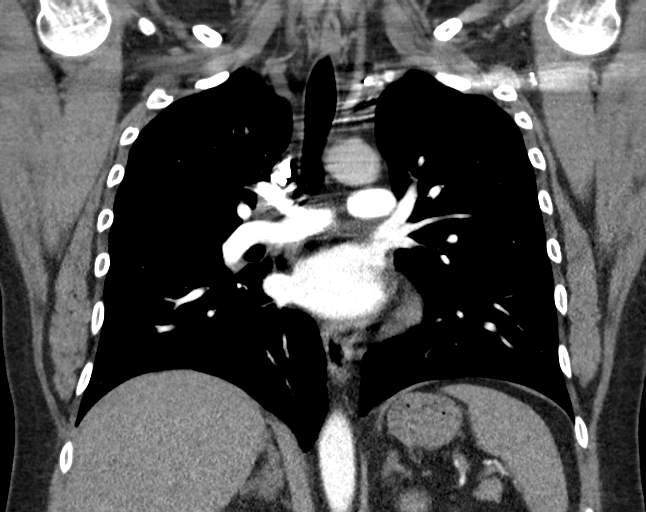
[im 63/84  soft-tissue]
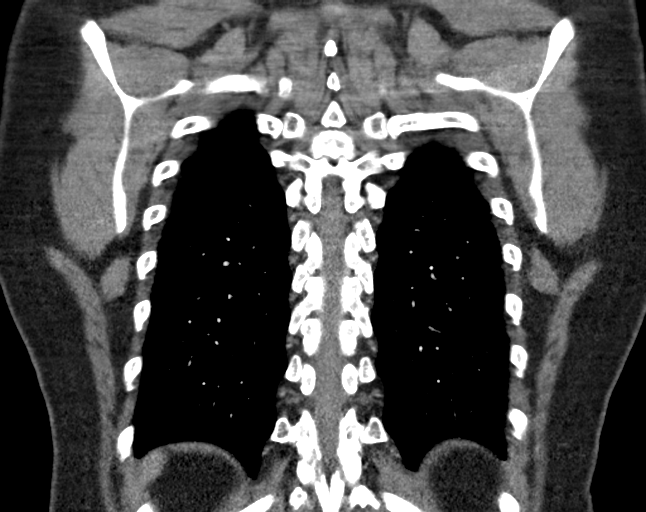

[18 of 46 positions shown; findings below may reference images not displayed]

FINDINGS: Cardiovascular: There are no filling defects within the pulmonary
arteries to suggest pulmonary embolus. Thoracic aorta is normal in
caliber without dissection. Heart is normal in size. No pericardial
effusion.

Mediastinum/Nodes: No enlarged mediastinal or hilar lymph nodes. No
axillary adenopathy. Surgical clips in the right axilla. Minimal
patchy soft tissue density in the anterior mediastinum is stable may
represent residual thymus.

Lungs/Pleura: No acute or focal airspace disease. No pleural fluid.
No pulmonary edema. Small pulmonary nodules as followed. 2 mm
anterolateral left upper lobe nodule, series 6, image 21, unchanged.
5 mm perifissural right upper lobe nodule, series 6, image 35,
unchanged. 2 mm subpleural right middle lobe nodule, series 6, image
49, previously 3 mm. 2 mm subpleural left lower lobe pulmonary
nodule, series 6, image 66, decreased from 3 mm. Stable benign
calcified granuloma in the right middle lobe, series 6, image 66. No
evidence of new pulmonary nodule or mass. Trachea and mainstem
bronchi are patent. Mild right apical scarring.

Upper Abdomen: No acute findings.

Musculoskeletal: No focal bone lesion or acute osseous abnormality.
Bilateral mastectomy. Stable asymmetric soft tissue density in the
right chest wall, series 4, image 45.

Review of the MIP images confirms the above findings.
IMPRESSION: 1. No pulmonary embolus or acute intrathoracic abnormality.
2. Unchanged small bilateral pulmonary nodules.
3. Bilateral mastectomy. Stable asymmetric soft tissue density in
the right chest wall.

## 2020-06-10 IMAGING — CR DG CHEST 2V
2 series · 2 of 2 positions shown · non-contrast
Comparison: [DATE]

CLINICAL DATA: Back pain.

EXAM:
CHEST - 2 VIEW

[chest pa]
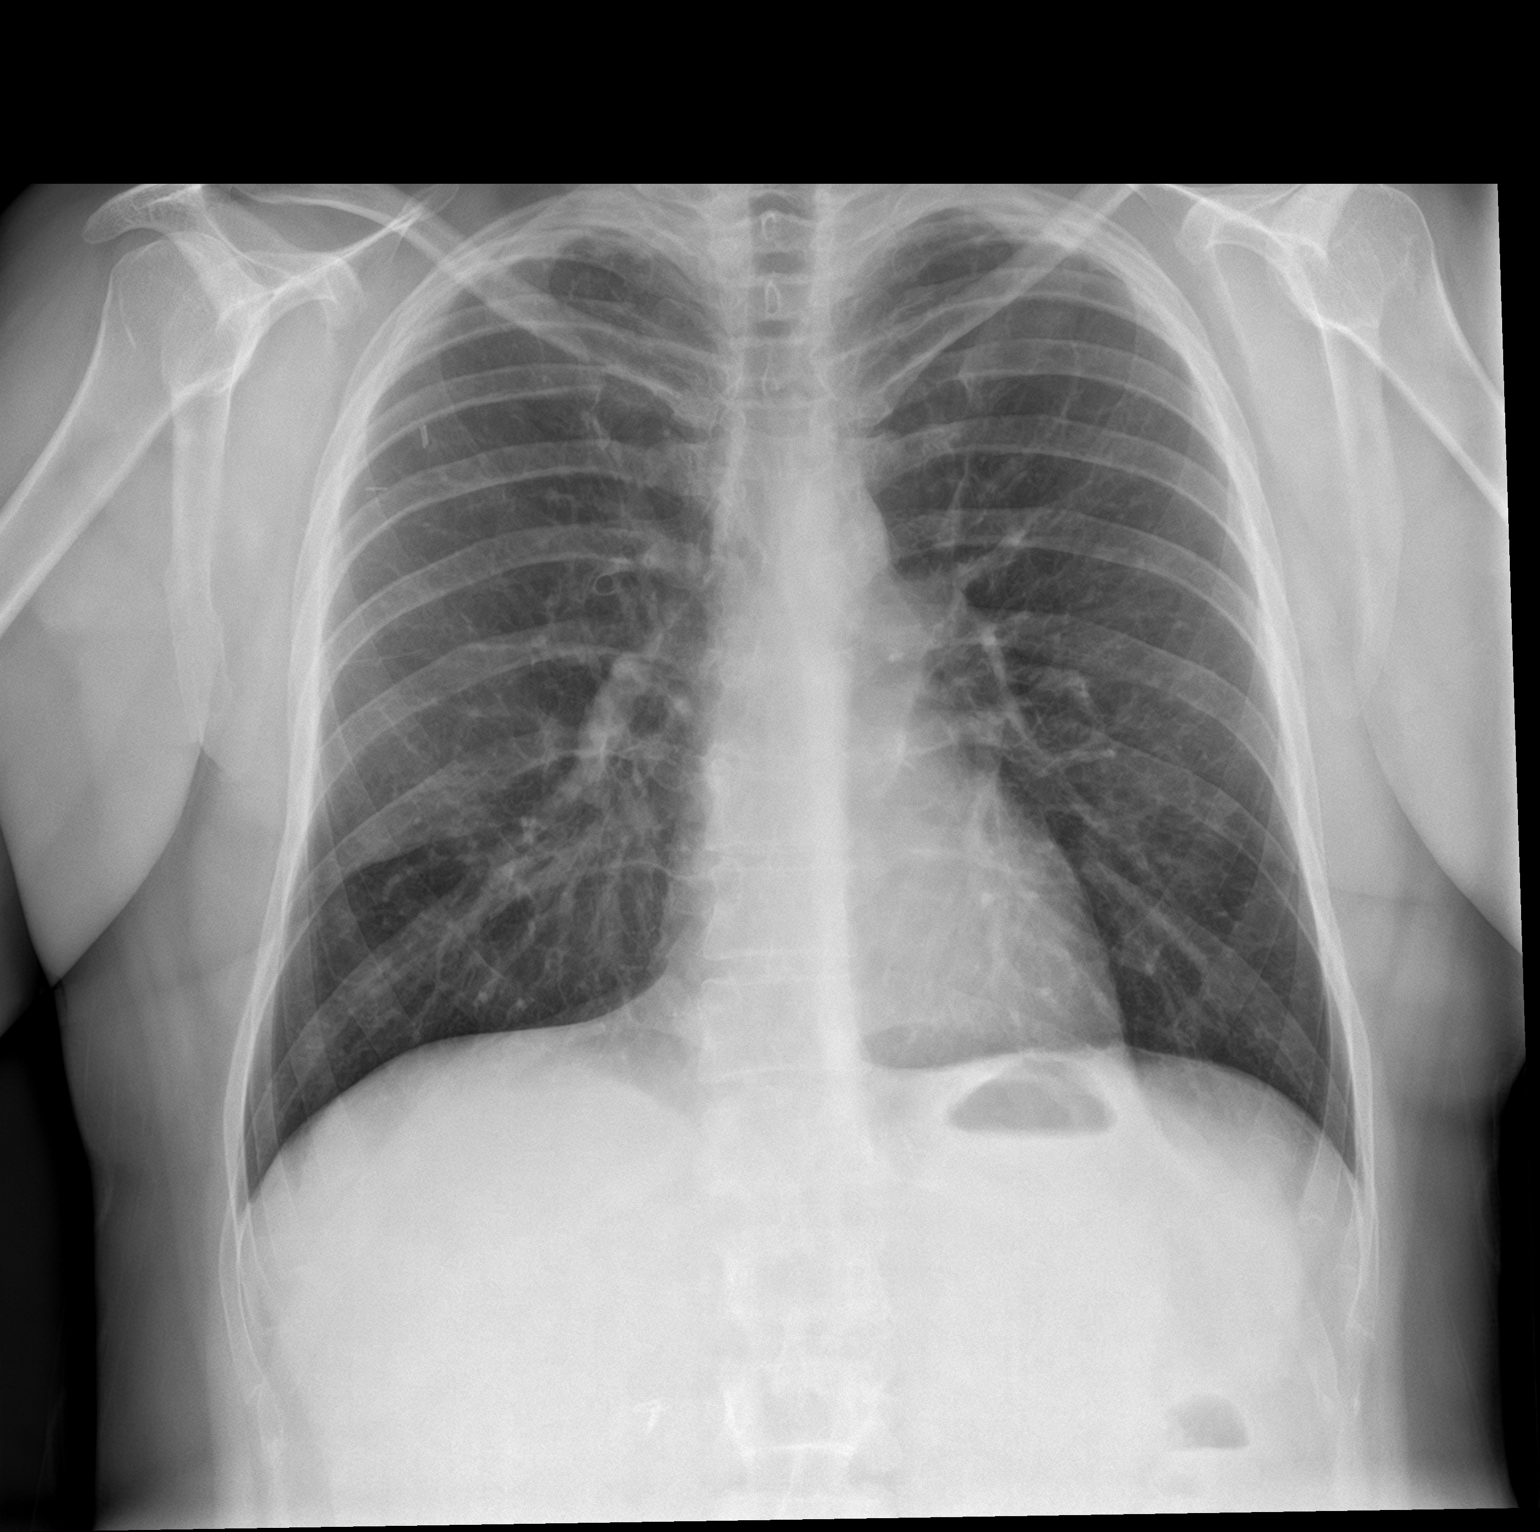

[chest lat]
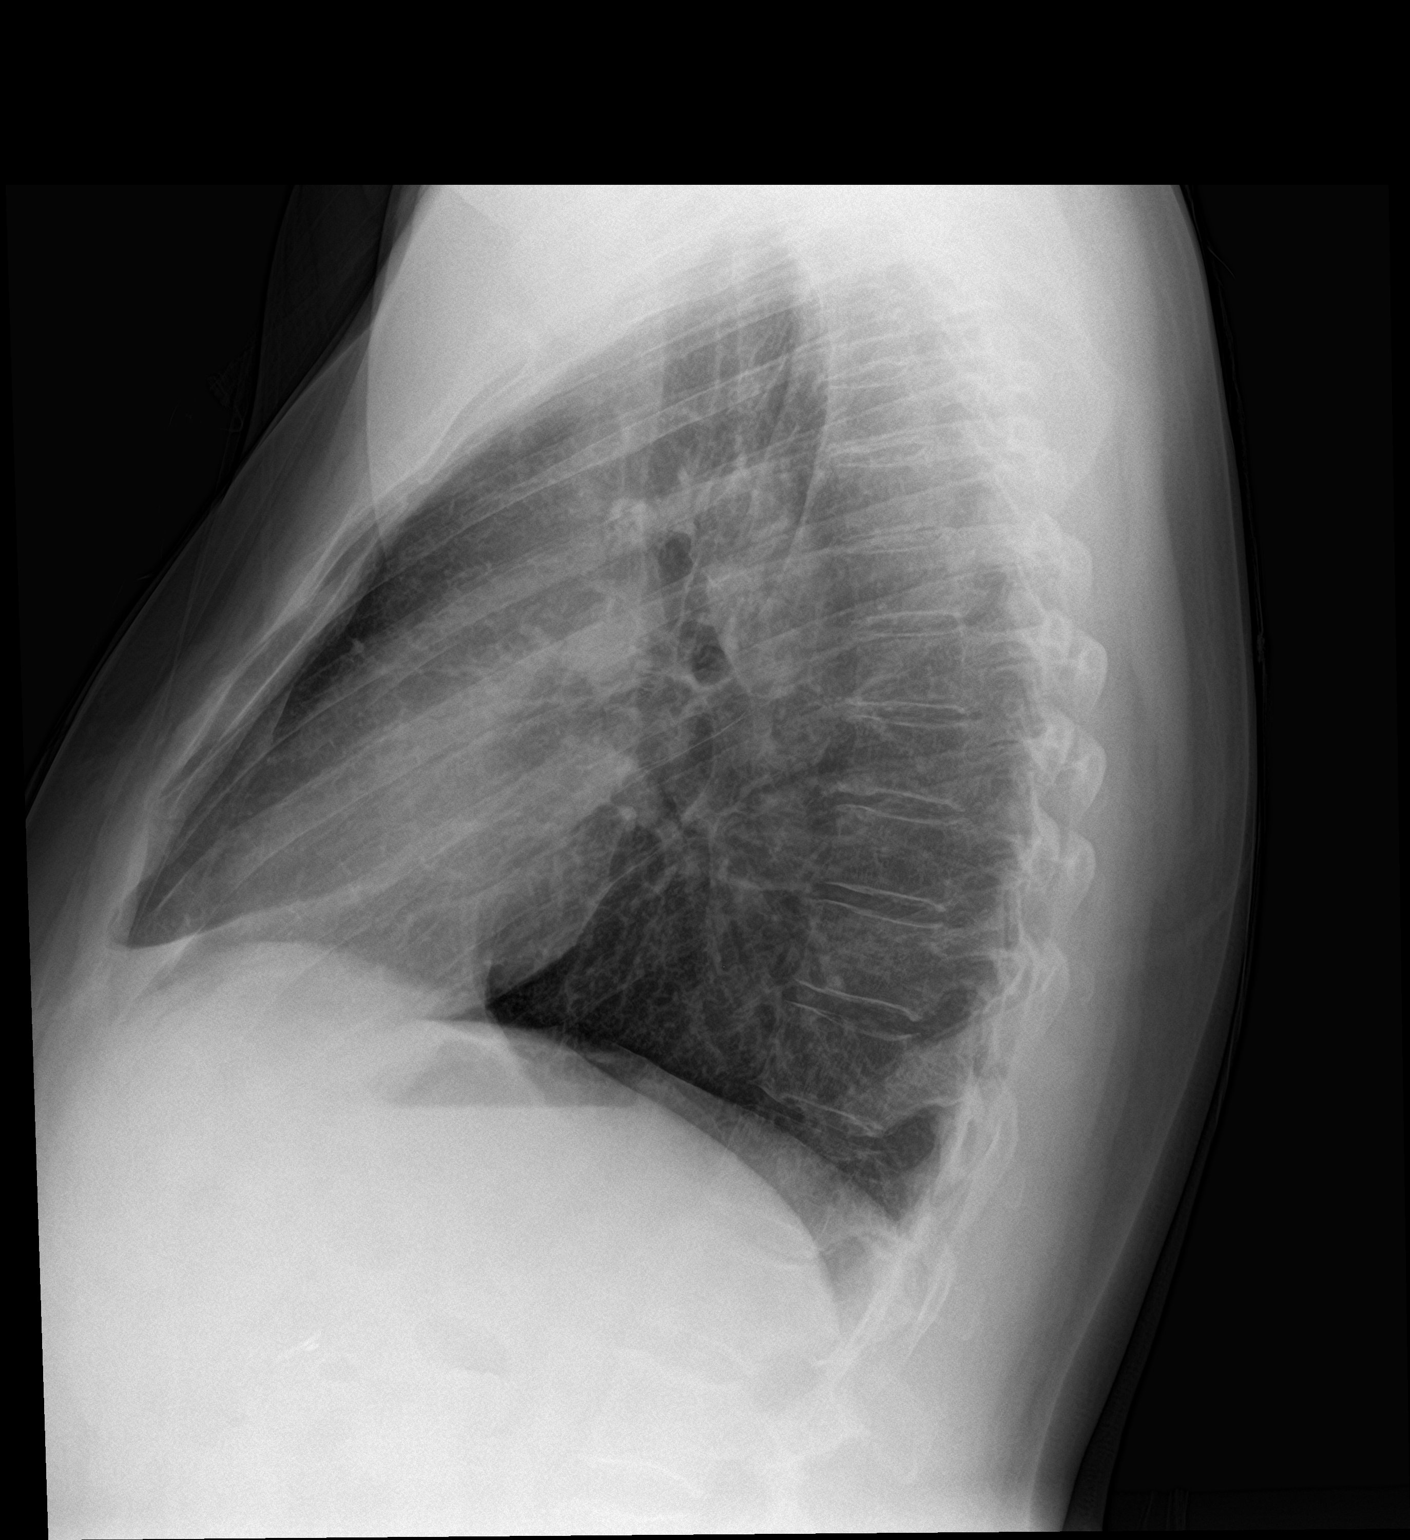

[2 of 2 positions shown; findings below may reference images not displayed]

FINDINGS: The previously demonstrated left-sided Port-A-Cath has been removed.
The lungs are clear. There is no pneumothorax. No pleural effusion.
No focal infiltrate. The heart size is stable. There is a small
nodular density projecting over the anterior right anterior sixth
rib.
IMPRESSION: 1. No acute cardiopulmonary process.
2. A follow-up outpatient two-view chest x-ray is recommended in 4-6
weeks for further evaluation.

## 2020-06-10 MED ORDER — SODIUM CHLORIDE 0.9% FLUSH
3.0000 mL | Freq: Once | INTRAVENOUS | Status: AC
  Administered 2020-06-10: 3 mL via INTRAVENOUS

## 2020-06-10 MED ORDER — LIDOCAINE 5 % EX PTCH: 1.0000 | MEDICATED_PATCH | Freq: Two times a day (BID) | CUTANEOUS | 0 refills | Status: DC

## 2020-06-10 MED ORDER — IOHEXOL 350 MG/ML SOLN
75.0000 mL | Freq: Once | INTRAVENOUS | Status: AC | PRN
  Administered 2020-06-10: 75 mL via INTRAVENOUS

## 2020-06-10 MED ORDER — METHOCARBAMOL 500 MG PO TABS: 500.0000 mg | ORAL_TABLET | Freq: Three times a day (TID) | ORAL | 0 refills | Status: DC | PRN

## 2020-06-10 MED ORDER — NAPROXEN 375 MG PO TABS
375.0000 mg | ORAL_TABLET | Freq: Two times a day (BID) | ORAL | 0 refills | Status: AC
Start: 2020-06-10 — End: 2020-06-17

## 2020-06-10 MED ORDER — DEXAMETHASONE SODIUM PHOSPHATE 10 MG/ML IJ SOLN
10.0000 mg | Freq: Once | INTRAMUSCULAR | Status: AC
  Administered 2020-06-10: 10 mg via INTRAVENOUS
  Filled 2020-06-10: qty 1

## 2020-06-10 MED ORDER — KETOROLAC TROMETHAMINE 30 MG/ML IJ SOLN
15.0000 mg | Freq: Once | INTRAMUSCULAR | Status: AC
  Administered 2020-06-10: 15 mg via INTRAVENOUS
  Filled 2020-06-10: qty 1

## 2020-06-10 MED ORDER — PREDNISONE 20 MG PO TABS
40.0000 mg | ORAL_TABLET | Freq: Every day | ORAL | 0 refills | Status: AC
Start: 2020-06-10 — End: 2020-06-15

## 2020-06-10 NOTE — ED Notes (Signed)
Pt states coming in for pain to the back, after sneezing.

## 2020-06-10 NOTE — ED Triage Notes (Signed)
PT to ED with complaints of back pain for a few months. Went [facility] few months [facility] and had normal xrays and kidney functions. PT had an episode this morning where she sneezed and felt unbearble pain. Pain is located in upper back, lower than shoulder blade slightly to the right of spine. PT describes it as a pain on the inside that sometimes takes her breath.

## 2020-06-10 NOTE — ED Provider Notes (Signed)
[facility] Emergency Department Provider Note  ____________________________________________   First MD Initiated Contact with Patient 06/10/20 1832     (approximate)  I have reviewed the triage vital signs and the nursing notes.   HISTORY  Chief Complaint Back Pain    HPI Theresa Chaney is a 39 y.o. female  With PMHx breast CA, CM, COPD, here with right sided CP. Pt reports approx 3-4 week history of intermittent, but progressively worsening right sided paraspinal thoracic back and chest wall pain radiating to her anterior chest. Pt reports the sx began shortly after her Gb surgery and she initially thought it was a pulled muscle in her back. She has had aching, throbbing pain that is intermittently sharp and shoots around her chest. Earlier today, she sneezed and this pain acutely, severely worsened and is now 10/10. Pain is worse with palpation, movement but also inspiration. Pain is worse with coughing though she has not necessarily had an increased cough. No SOB, diaphoresis. No h/o CAD. No h/o DVT/PE. No leg swelling.        Past Medical History:  Diagnosis Date   Anemia    Arthritis    BRCA negative 11/26/2017   Breast cancer ([place]) 10/11/2017   Multifocal, ER positive, PR negative, HER-2 negative. ypT3 ypN2a 8.7 cm, 4/15 nodes   Cardiomyopathy ([place])    a. 10/2017 Echo: EF 60-65%, no rwma, Gr1 DD, nl RV size/fxn; b. 04/2018 Echo: EF 55-60%, no rwma, Nl RV size/fxn; c. 08/2018 Echo: EF 45%, diff HK, ? HK of antsept wall. Gr1 DD. Mild MR. Mild LAE/RAE. Mod dil RV.    Chronic bronchitis ([place]) 11/2017   COPD (chronic obstructive pulmonary disease) (HCC)    MILD PER CXR   Depression    Family history of cancer    GERD (gastroesophageal reflux disease)    Headache    MIGRAINES   Heart murmur    ASYMPTOMATIC   Personal history of chemotherapy    current for right breast ca    Patient Active Problem List   Diagnosis Date Noted    Fracture of neck of metacarpal bone 05/14/2019   Chronic fatigue 04/09/2019   Polyarthralgia 04/09/2019   Status post right breast reconstruction 02/26/2019   Status post right mastectomy 02/26/2019   Mastalgia 02/15/2019   Shortness of breath 08/23/2018   Nonischemic cardiomyopathy ([employer]) 08/23/2018   Preprocedural cardiovascular examination 08/23/2018   Tachycardia 08/23/2018   Palpitations 08/23/2018   Estrogen receptor positive status (ER+) 04/04/2018   Acquired absence of right breast and nipple 04/03/2018   Breast cancer of upper-outer quadrant of right female breast ([place]) 03/19/2018   Family history of cancer    Malignant neoplasm of overlapping sites of right breast in female, estrogen receptor positive ([place]) 10/19/2017   Gastroesophageal reflux disease without esophagitis 02/24/2017   Generalized anxiety disorder 10/03/2014   Headache 10/03/2014    Past Surgical History:  Procedure Laterality Date   AXILLARY LYMPH NODE DISSECTION Right 03/19/2018   Procedure: AXILLARY LYMPH NODE DISSECTION;  Surgeon: Robert Bellow, MD;  Location: [facility] OR[facility];  Service: General;  Laterality: Right;   BREAST BIOPSY Right 10/11/2017   12:30 posterior coil clip invasive mammary carcinoma   BREAST BIOPSY Right 10/11/2017   11:30 middle depth ribbon clip DCIS   BREAST BIOPSY Right 10/11/2017   5:30 anterior depth x shape invasive ductal carcinoma   BREAST IMPLANT REMOVAL Right 06/03/2019   Procedure: REMOVAL OF RIGHT BREAST IMPLANTS;  Surgeon: Audelia Hives  S, DO;  Location: [facility];  Service: Plastics;  Laterality: Right;   BREAST RECONSTRUCTION WITH PLACEMENT OF TISSUE EXPANDER AND FLEX HD (ACELLULAR HYDRATED DERMIS) Right 03/19/2018   Procedure: BREAST RECONSTRUCTION WITH PLACEMENT OF TISSUE EXPANDER AND FLEX HD (ACELLULAR HYDRATED DERMIS);  Surgeon: Wallace Going, DO;  Location: [facility];  Service: Plastics;  Laterality: Right;   CHOLECYSTECTOMY N/A  04/27/2020   Procedure: LAPAROSCOPIC CHOLECYSTECTOMY WITH INTRAOPERATIVE CHOLANGIOGRAM;  Surgeon: Robert Bellow, MD;  Location: [facility];  Service: General;  Laterality: N/A;   ESOPHAGOGASTRODUODENOSCOPY (EGD) WITH PROPOFOL N/A 04/17/2020   Procedure: ESOPHAGOGASTRODUODENOSCOPY (EGD) WITH PROPOFOL;  Surgeon: Robert Bellow, MD;  Location: [facility];  Service: Endoscopy;  Laterality: N/A;  with biopsy   LAPAROSCOPIC BILATERAL SALPINGO OOPHERECTOMY Bilateral 03/19/2018   Procedure: LAPAROSCOPIC BILATERAL SALPINGO OOPHORECTOMY;  Surgeon: Benjaman Kindler, MD;  Location: [facility];  Service: Gynecology;  Laterality: Bilateral;   MASTECTOMY Right 03/2018   MASTECTOMY W/ SENTINEL NODE BIOPSY Right 03/19/2018   Procedure: MASTECTOMY WITH SENTINEL LYMPH NODE BIOPSY;  Surgeon: Robert Bellow, MD;  Location: [place] [facility];  Service: General;  Laterality: Right;   PORT-A-CATH REMOVAL Left 06/03/2019   Procedure: REMOVAL PORT-A-CATH;  Surgeon: Robert Bellow, MD;  Location: [facility];  Service: General;  Laterality: Left;   PORTACATH PLACEMENT Left 10/24/2017   Procedure: INSERTION PORT-A-CATH;  Surgeon: Robert Bellow, MD;  Location: [facility];  Service: General;  Laterality: Left;   REMOVAL OF TISSUE EXPANDER AND PLACEMENT OF IMPLANT Right 07/20/2018   Procedure: REMOVAL OF RIGHT BREAST TISSUE EXPANDER AND PLACEMENT OF IMPLANT;  Surgeon: Wallace Going, DO;  Location: [place];  Service: Plastics;  Laterality: Right;   SIMPLE MASTECTOMY WITH AXILLARY SENTINEL NODE BIOPSY Left 06/03/2019   Procedure: SIMPLE MASTECTOMY LEFT;  Surgeon: Robert Bellow, MD;  Location: [facility];  Service: General;  Laterality: Left;    Prior to Admission medications   Medication Sig Start Date End Date Taking? Authorizing Provider  albuterol (VENTOLIN HFA) 108 (90 Base) MCG/ACT inhaler Inhale 2 puffs into the lungs every 6 (six) hours as needed for wheezing or shortness of breath.      [provider]  buPROPion (WELLBUTRIN SR) 150 MG 12 hr tablet Take 150 mg by mouth 2 (two) times daily.    [provider]  Calcium Carbonate-Vitamin D3 (CALCIUM 600/VITAMIN D) 600-400 MG-UNIT TABS Take 2 tablets by mouth daily. 10/10/19   Earlie Server, MD  cholecalciferol (VITAMIN D3) 25 MCG (1000 UNIT) tablet Take 1 tablet (1,000 Units total) by mouth daily. 01/13/20   Earlie Server, MD  cyanocobalamin (,VITAMIN B-12,) 1000 MCG/ML injection Inject 1,000 mcg into the skin every 30 (thirty) days. 09/02/19   [provider]  cyclobenzaprine (FLEXERIL) 5 MG tablet Take 5 mg by mouth 3 (three) times daily. 08/06/19   [provider]  escitalopram (LEXAPRO) 20 MG tablet Take 20 mg by mouth every morning.     [provider]  esomeprazole (NEXIUM) 40 MG capsule Take 40 mg by mouth every morning.     [provider]  folic acid (FOLVITE) 1 MG tablet Take 1 mg by mouth daily. 09/02/19   [provider]  HYDROcodone-acetaminophen (NORCO/VICODIN) 5-325 MG tablet Take 1 tablet by mouth every 4 (four) hours as needed for moderate pain. 04/27/20 04/27/21  Robert Bellow, MD  ibuprofen (ADVIL) 800 MG tablet Take 800 mg by mouth every 8 (eight) hours as needed for moderate pain.  [provider]  letrozole (FEMARA) 2.5 MG tablet Take 2.5 mg by mouth every morning.     [provider]  lidocaine (LIDODERM) 5 % Place 1 patch onto the skin every 12 (twelve) hours. Remove & Discard patch within 12 hours or as directed by MD 06/10/20 06/10/21  Duffy Bruce, MD  loratadine (CLARITIN) 10 MG tablet Take 10 mg by mouth every morning.     [provider]  LORazepam (ATIVAN) 1 MG tablet Take 1 mg by mouth 3 (three) times daily.    [provider]  Magnesium 400 MG TABS Take 400 mg by mouth 2 (two) times daily.    [provider]  meloxicam (MOBIC) 7.5 MG tablet Take 7.5 mg by mouth 2 (two) times a day.    [provider]  methocarbamol (ROBAXIN) 500 MG tablet Take 1 tablet (500 mg total) by mouth every 8 (eight) hours as needed for muscle spasms. 06/10/20   Duffy Bruce, MD  methotrexate (RHEUMATREX) 2.5 MG tablet Take 20 mg by mouth every Sunday.  09/02/19   [provider]  metoprolol succinate (TOPROL-XL) 25 MG 24 hr tablet Take 37.5 mg by mouth every morning.  07/08/19   [provider]  naproxen (NAPROSYN) 375 MG tablet Take 1 tablet (375 mg total) by mouth 2 (two) times daily with a meal for 7 days. 06/10/20 06/17/20  Duffy Bruce, MD  nortriptyline (PAMELOR) 10 MG capsule Take 30 mg by mouth at bedtime.     [provider]  phentermine (ADIPEX-P) 37.5 MG tablet Take 37.5 mg by mouth daily before breakfast.  09/17/19   [provider]  predniSONE (DELTASONE) 20 MG tablet Take 2 tablets (40 mg total) by mouth daily for 5 days. 06/10/20 06/15/20  Duffy Bruce, MD  pregabalin (LYRICA) 300 MG capsule Take 1 capsule (300 mg total) by mouth daily. Patient taking differently: Take 300 mg by mouth every morning.  10/31/18   Earlie Server, MD  promethazine (PHENERGAN) 25 MG tablet Take 1 tablet (25 mg total) by mouth every 8 (eight) hours as needed for nausea or vomiting. 04/10/20   Earlie Server, MD  pyridOXINE (VITAMIN B-6) 100 MG tablet Take 100 mg by mouth daily.    [provider]  topiramate (TOPAMAX) 50 MG tablet Take 50 mg by mouth 2 (two) times daily.  11/06/19   [provider]  vitamin C (ASCORBIC ACID) 500 MG tablet Take 500 mg by mouth daily.    [provider]    Allergies Patient has no known allergies.  Family History  Problem Relation Age of Onset   Melanoma Maternal Aunt        other aunts with BCC/SCC/Melanoma   Diabetes Father    Hypertension Father    Hyperlipidemia Father    Heart attack Father 58       "mild"   Bladder Cancer Maternal Grandmother    Cervical cancer Maternal Aunt 29       daughter w/ cervical  cancer as well   Melanoma Maternal Uncle        other uncles with BCC/SCC/Melanoma    Social History Social History   Tobacco Use   Smoking status: Current Every Day Smoker    Packs/day: 0.50    Years: 18.00    Pack years: 9.00    Types: Cigarettes    Start date: 06/21/2018   Smokeless tobacco: Never Used  Vaping Use   Vaping Use: Never used  Substance Use Topics  Alcohol use: No   Drug use: No    Review of Systems  Review of Systems  Constitutional: Negative for chills and fever.  HENT: Negative for sore throat.   Respiratory: Positive for chest tightness. Negative for shortness of breath.   Cardiovascular: Positive for chest pain.  Gastrointestinal: Negative for abdominal pain.  Genitourinary: Negative for flank pain.  Musculoskeletal: Negative for neck pain.  Skin: Negative for rash and wound.  Allergic/Immunologic: Negative for immunocompromised state.  Neurological: Negative for weakness and numbness.  Hematological: Does not bruise/bleed easily.  All other systems reviewed and are negative.    ____________________________________________  PHYSICAL EXAM:      VITAL SIGNS: ED Triage Vitals  Enc Vitals Group     BP 06/10/20 1511 108/84     Pulse Rate 06/10/20 1511 (!) 105     Resp 06/10/20 1511 (!) 22     Temp 06/10/20 1511 98.4 F (36.9 C)     Temp Source 06/10/20 1511 Oral     SpO2 06/10/20 1511 100 %     Weight 06/10/20 1512 185 lb (83.9 kg)     Height 06/10/20 1512 5' 6"  (1.676 m)     Head Circumference --      Peak Flow --      Pain Score 06/10/20 1511 6     Pain Loc --      Pain Edu? --      Excl. in [place]? --      Physical Exam Vitals and nursing note reviewed.  Constitutional:      General: She is not in acute distress.    Appearance: She is well-developed.  HENT:     Head: Normocephalic and atraumatic.  Eyes:     Conjunctiva/sclera: Conjunctivae normal.  Cardiovascular:     Rate and Rhythm: Normal rate and regular rhythm.      Heart sounds: Normal heart sounds. No murmur heard.  No friction rub.  Pulmonary:     Effort: Pulmonary effort is normal. No respiratory distress.     Breath sounds: Normal breath sounds. No wheezing or rales.  Chest:     Comments: Moderate TTP along right inferior and lateral intercostal spaces. No deformity, bruising, or swelling. No skin lesions. Abdominal:     General: There is no distension.     Palpations: Abdomen is soft.     Tenderness: There is no abdominal tenderness.  Musculoskeletal:     Cervical back: Neck supple.     Comments: Moderate tenderness along medial edge of right scapula with some paraspinal thoracic TTP.  Skin:    General: Skin is warm.     Capillary Refill: Capillary refill takes less than 2 seconds.  Neurological:     Mental Status: She is alert and oriented to person, place, and time.     Motor: No abnormal muscle tone.       ____________________________________________   LABS (all labs ordered are listed, but only abnormal results are displayed)  Labs Reviewed  BASIC METABOLIC PANEL - Abnormal; Notable for the following components:      Result Value   Glucose, Bld 117 (*)    All other components within normal limits  CBC - Abnormal; Notable for the following components:   HCT 35.6 (*)    All other components within normal limits  POC URINE PREG, ED  TROPONIN I (HIGH SENSITIVITY)  TROPONIN I (HIGH SENSITIVITY)    ____________________________________________  EKG: Sinus tachycardia, VR 105. PR 134, QRS 92, QTc 444.  Non specific changes, no acute St elevations or depressions. ________________________________________  RADIOLOGY All imaging, including plain films, CT scans, and ultrasounds, independently reviewed by me, and interpretations confirmed via formal radiology reads.  ED MD interpretation:   CXR: Clear, lung nodule noted on right CT Angio: No PE, stable lung nodules  Official radiology report(s): DG Chest 2 View  Result Date:  06/10/2020 CLINICAL DATA:  Back pain. EXAM: CHEST - 2 VIEW COMPARISON:  December 11, 2017 FINDINGS: The previously demonstrated left-sided Port-A-Cath has been removed. The lungs are clear. There is no pneumothorax. No pleural effusion. No focal infiltrate. The heart size is stable. There is a small nodular density projecting over the anterior right anterior sixth rib. IMPRESSION: 1. No acute cardiopulmonary process. 2. A follow-up outpatient two-view chest x-ray is recommended in 4-6 weeks for further evaluation. Electronically Signed   By: Constance Holster M.D.   On: 06/10/2020 16:10   CT Angio Chest PE W and/or Wo Contrast  Result Date: 06/10/2020 CLINICAL DATA:  Shortness of breath. Back pain. EXAM: CT ANGIOGRAPHY CHEST WITH CONTRAST TECHNIQUE: Multidetector CT imaging of the chest was performed using the standard protocol during bolus administration of intravenous contrast. Multiplanar CT image reconstructions and MIPs were obtained to evaluate the vascular anatomy. CONTRAST:  58m OMNIPAQUE IOHEXOL 350 MG/ML SOLN COMPARISON:  Chest radiograph earlier this day. Chest CT 01/24/2020 FINDINGS: Cardiovascular: There are no filling defects within the pulmonary arteries to suggest pulmonary embolus. Thoracic aorta is normal in caliber without dissection. Heart is normal in size. No pericardial effusion. Mediastinum/Nodes: No enlarged mediastinal or hilar lymph nodes. No axillary adenopathy. Surgical clips in the right axilla. Minimal patchy soft tissue density in the anterior mediastinum is stable may represent residual thymus. Lungs/Pleura: No acute or focal airspace disease. No pleural fluid. No pulmonary edema. Small pulmonary nodules as followed. 2 mm anterolateral left upper lobe nodule, series 6, image 21, unchanged. 5 mm perifissural right upper lobe nodule, series 6, image 35, unchanged. 2 mm subpleural right middle lobe nodule, series 6, image 49, previously 3 mm. 2 mm subpleural left lower lobe  pulmonary nodule, series 6, image 66, decreased from 3 mm. Stable benign calcified granuloma in the right middle lobe, series 6, image 66. No evidence of new pulmonary nodule or mass. Trachea and mainstem bronchi are patent. Mild right apical scarring. Upper Abdomen: No acute findings. Musculoskeletal: No focal bone lesion or acute osseous abnormality. Bilateral mastectomy. Stable asymmetric soft tissue density in the right chest wall, series 4, image 45. Review of the MIP images confirms the above findings. IMPRESSION: 1. No pulmonary embolus or acute intrathoracic abnormality. 2. Unchanged small bilateral pulmonary nodules. 3. Bilateral mastectomy. Stable asymmetric soft tissue density in the right chest wall. Electronically Signed   By: MKeith RakeM.D.   On: 06/10/2020 20:09    ____________________________________________  PROCEDURES   Procedure(s) performed (including Critical Care):  Procedures  ____________________________________________  INITIAL IMPRESSION / MDM / [place]/ ED COURSE  As part of my medical decision making, I reviewed the following data within the e[place]notes reviewed and incorporated, Old chart reviewed, Notes from prior ED visits, and [place] Controlled Substance Database       *DLaneisha MinoHMurtaghwas evaluated in Emergency Department on 06/10/2020 for the symptoms described in the history of present illness. She was evaluated in the context of the global COVID-19 pandemic, which necessitated consideration that the patient might be at risk for infection with the  SARS-CoV-2 virus that causes COVID-19. Institutional protocols and algorithms that pertain to the evaluation of patients at risk for COVID-19 are in a state of rapid change based on information released by regulatory bodies including the CDC and federal and state organizations. These policies and algorithms were followed during the patient's care in the ED.  Some ED  evaluations and interventions may be delayed as a result of limited staffing during the pandemic.*     Medical Decision Making:  39 yo F with PMHx as above here with atypical R sided chst pain. Suspect MSK chest pain, possibly related to thoracic radiculopathy or underlying exacerbation of her RA. She has no skin lesions to suggest Zoster. EKG is nonischemic and trop neg x 2, with low-risk HEART score, do not suspect ACS. Given her h/o RA, surgery, and pleuritic component, CT angio obtained and reviewed as above, fortunately neg for PE or other abnormality.  Will treat with NSAIDs, also trial of prednisone given her h/o RA. Will have her call her PCP and Rheum. Return precautions given.  ____________________________________________  FINAL CLINICAL IMPRESSION(S) / ED DIAGNOSES  Final diagnoses:  Atypical chest pain  Radicular pain     MEDICATIONS GIVEN DURING THIS VISIT:  Medications  sodium chloride flush (NS) 0.9 % injection 3 mL (3 mLs Intravenous Given 06/10/20 1911)  ketorolac (TORADOL) 30 MG/ML injection 15 mg (15 mg Intravenous Given 06/10/20 1951)  iohexol (OMNIPAQUE) 350 MG/ML injection 75 mL (75 mLs Intravenous Contrast Given 06/10/20 1935)  dexamethasone (DECADRON) injection 10 mg (10 mg Intravenous Given 06/10/20 2039)     ED Discharge Orders         Ordered    naproxen (NAPROSYN) 375 MG tablet  2 times daily with meals     Discontinue  Reprint     06/10/20 2021    methocarbamol (ROBAXIN) 500 MG tablet  Every 8 hours PRN     Discontinue  Reprint     06/10/20 2021    lidocaine (LIDODERM) 5 %  Every 12 hours     Discontinue  Reprint     06/10/20 2021    predniSONE (DELTASONE) 20 MG tablet  Daily     Discontinue  Reprint     06/10/20 2110           Note:  This document was prepared using Dragon voice recognition software and may include unintentional dictation errors.   Duffy Bruce, MD 06/10/20 [id number]

## 2020-06-22 ENCOUNTER — Other Ambulatory Visit: Payer: Self-pay | Admitting: Rheumatology

## 2020-06-22 DIAGNOSIS — M546 Pain in thoracic spine: Secondary | ICD-10-CM

## 2020-07-07 ENCOUNTER — Ambulatory Visit
Admission: RE | Admit: 2020-07-07 | Discharge: 2020-07-07 | Disposition: A | Discharge status: Home / Self Care | Payer: Medicare HMO | Source: Ambulatory Visit | Attending: Rheumatology | Admitting: Rheumatology

## 2020-07-07 ENCOUNTER — Other Ambulatory Visit: Payer: Self-pay

## 2020-07-07 DIAGNOSIS — M546 Pain in thoracic spine: Secondary | ICD-10-CM

## 2020-07-07 IMAGING — MR MR THORACIC SPINE W/O CM
6 of 7 series · 37 of 48 positions shown · non-contrast
Comparison: Chest CTA [DATE]

CLINICAL DATA: Thoracic spine pain for 2 months. Right-sided rib
pain status post sneezing. History of breast cancer.

EXAM:
MRI THORACIC SPINE WITHOUT CONTRAST
TECHNIQUE: Multiplanar, multisequence MR imaging of the thoracic spine was
performed. No intravenous contrast was administered.

[Series 16: T1 · sagittal · 5.0mm · 1.41mm/px · 3 of 9 slices shown (1 of 2)]
[im 1/9]
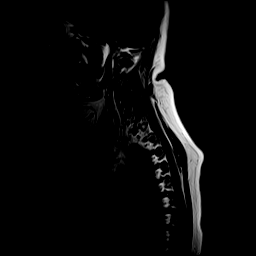
[im 5/9]
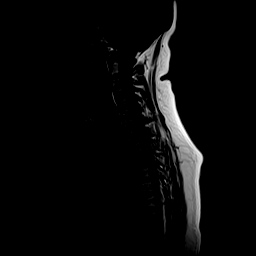
[im 9/9]
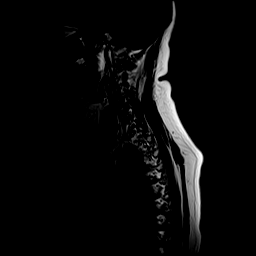

[Series 17: T2 · sagittal · 3.0mm · 1.06mm/px · 4 of 17 slices shown (1 of 3)]
[im 1/17]
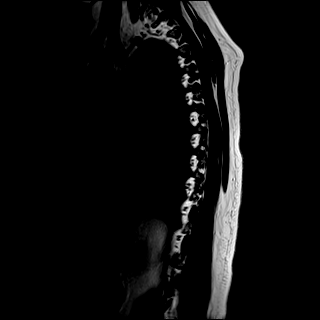
[im 6/17]
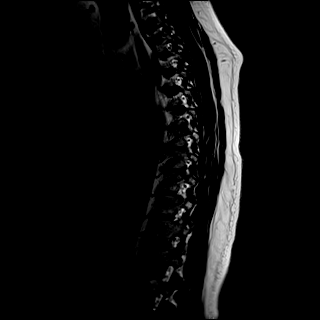
[im 11/17]
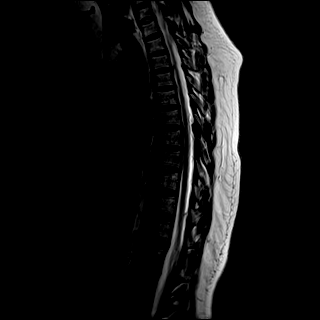
[im 17/17]
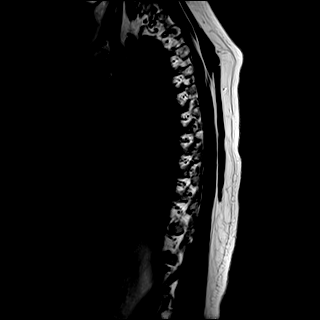

[Series 18: T1 · sagittal · 3.0mm · 1.06mm/px · 4 of 17 slices shown (2 of 2)]
[im 1/17]
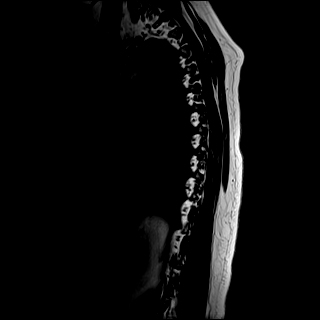
[im 6/17]
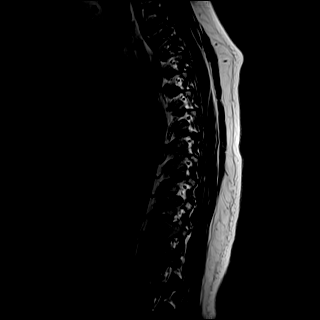
[im 11/17]
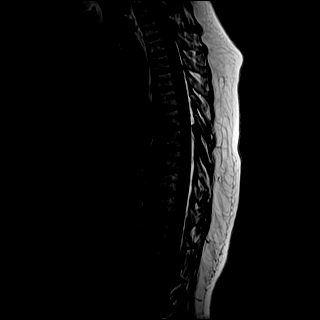
[im 17/17]
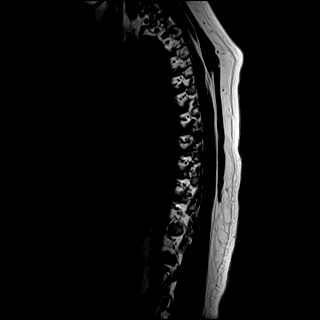

[Series 19: STIR · sagittal · 3.0mm · 0.53mm/px · 4 of 17 slices shown]
[im 1/17]
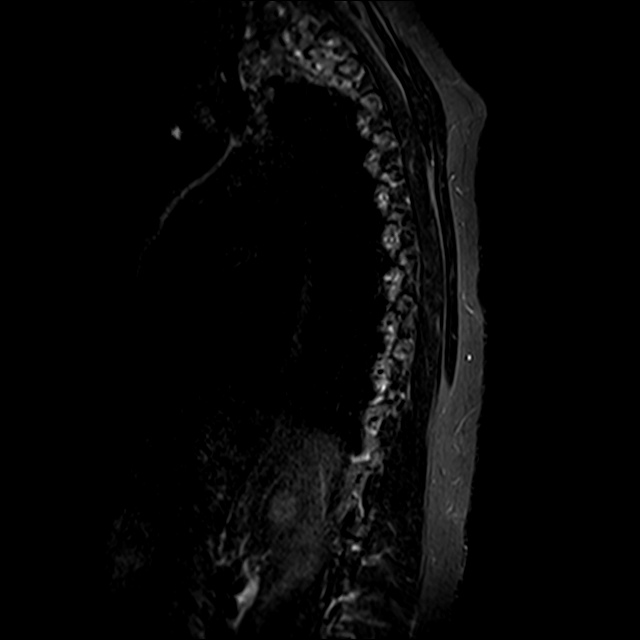
[im 6/17]
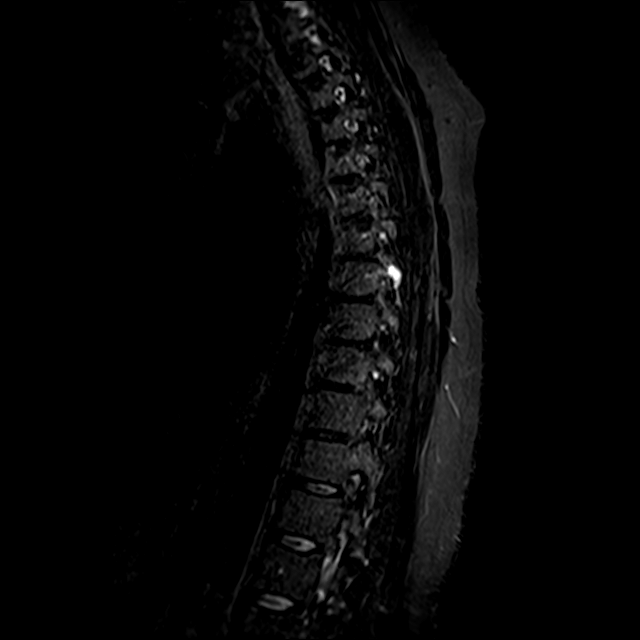
[im 11/17]
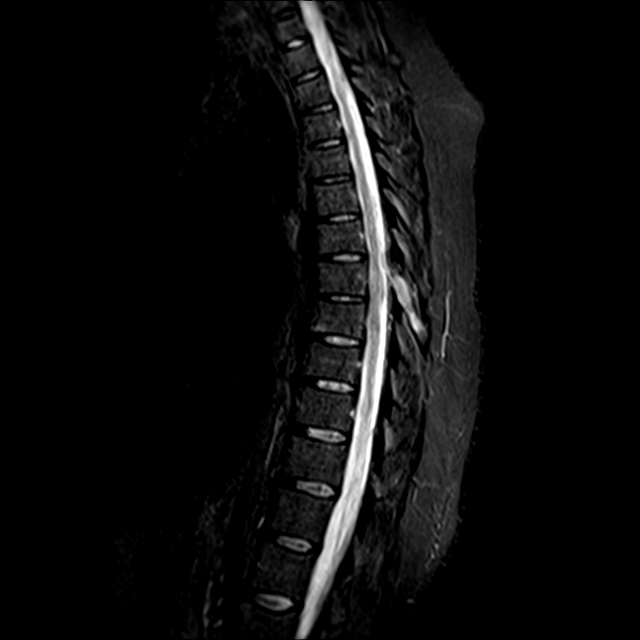
[im 17/17]
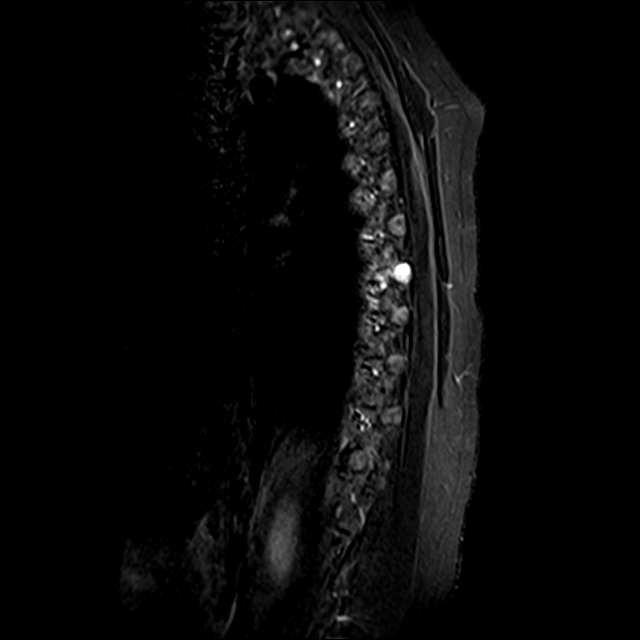

[Series 20: T2 · axial · 4.0mm · 0.59mm/px · z∈[-277,-46]mm · 11 of 41 slices shown (2 of 3)]
[im 1/41]
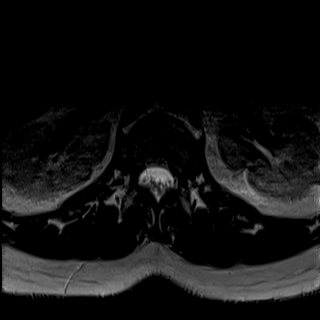
[im 5/41]
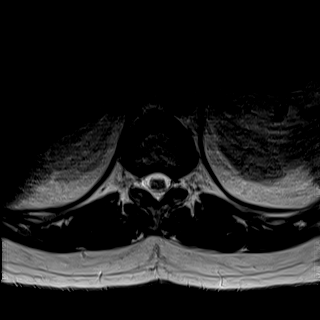
[im 9/41]
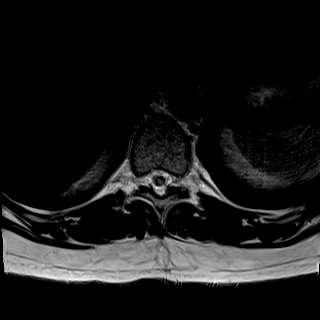
[im 13/41]
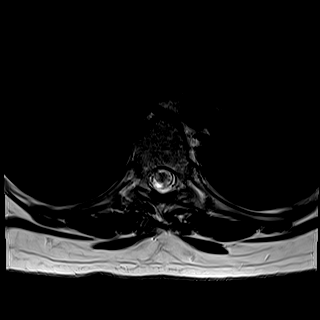
[im 17/41]
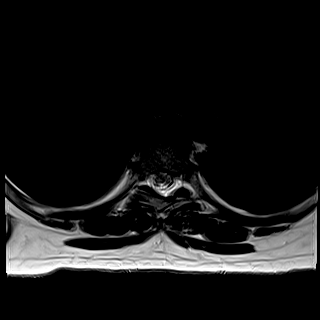
[im 21/41]
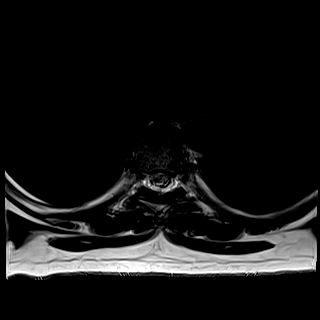
[im 25/41]
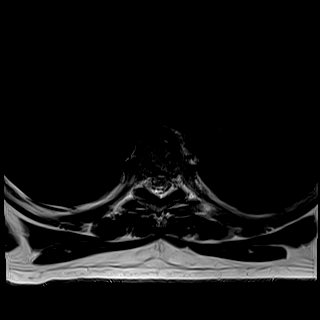
[im 29/41]
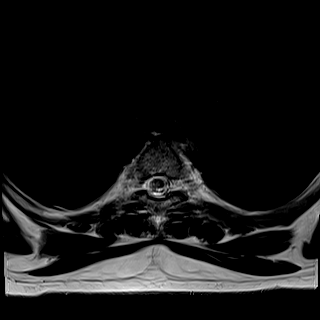
[im 33/41]
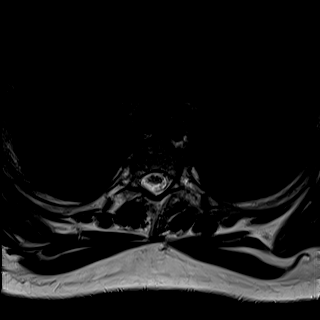
[im 37/41]
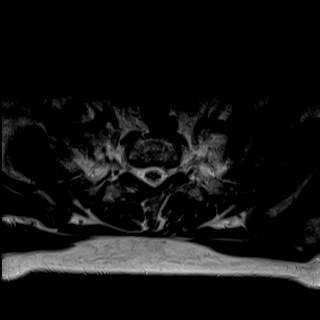
[im 41/41]
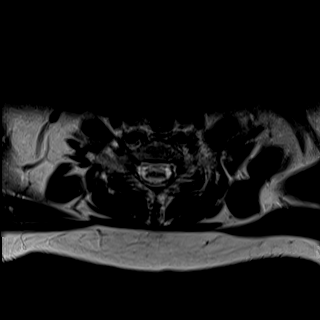

[Series 21: T2 · axial · 4.0mm · 0.59mm/px · z∈[-277,-46]mm · 11 of 41 slices shown (3 of 3)]
[im 1/41]
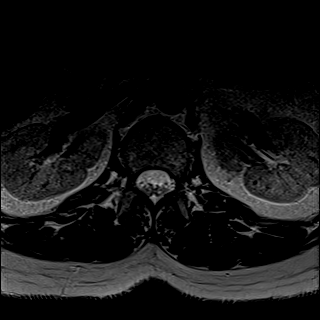
[im 5/41]
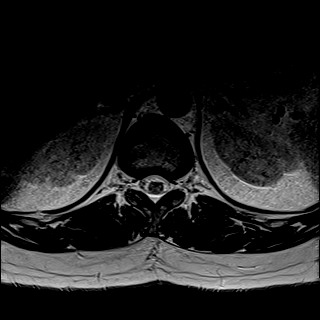
[im 9/41]
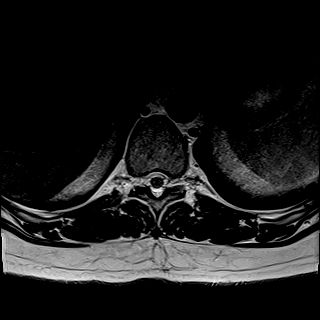
[im 13/41]
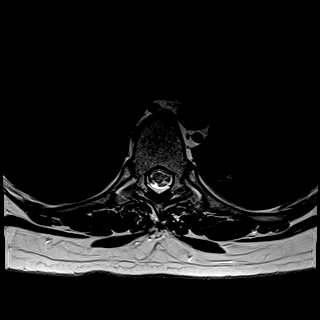
[im 17/41]
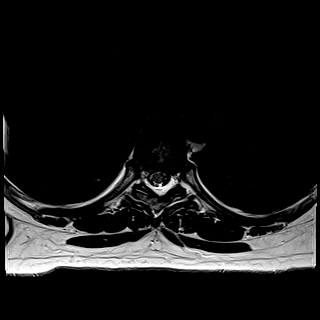
[im 21/41]
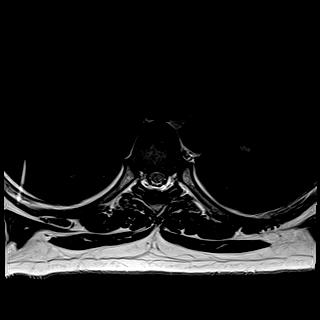
[im 25/41]
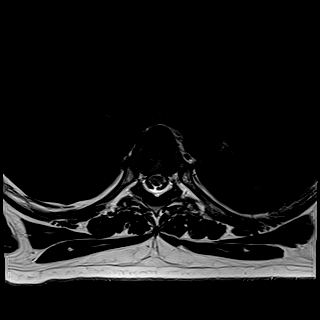
[im 29/41]
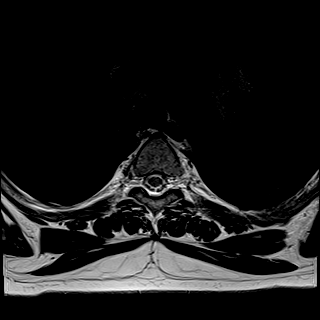
[im 33/41]
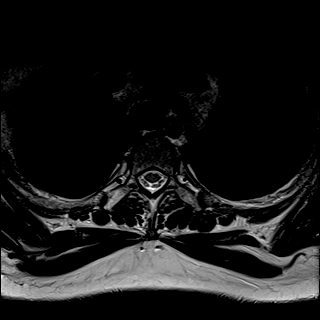
[im 37/41]
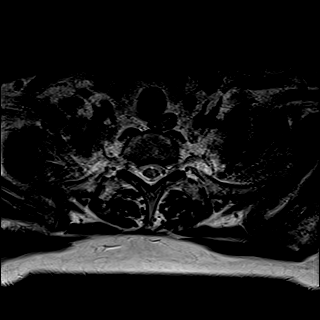
[im 41/41]
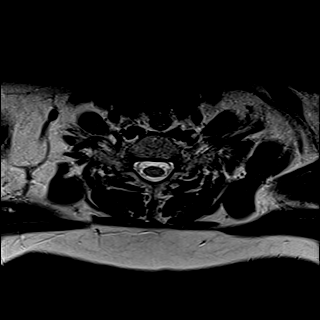

[37 of 48 positions shown; findings below may reference images not displayed]

FINDINGS: Alignment:  Normal.

Vertebrae: No fracture. There is abnormal T1 hypointensity and STIR
hyperintensity within the posterior elements at T7 including in the
right pars and lamina which are expanded and where there is a lytic
lesion on CT with cortical disruption consistent with tumor.
Abnormal signal extending into the left-sided posterior elements at
T7 as well as the right-sided posterior elements at T8 has an
appearance more resembling edema. No epidural tumor is evident on
this unenhanced study.

Cord:  Normal signal and morphology.

Paraspinal and other soft tissues: Unremarkable.

Disc levels:

Minimal spondylosis in the midthoracic spine. No sizable disc
herniation. Ligamentum flavum ossification at T6-7. No spinal or
neural foraminal stenosis.
IMPRESSION: 1. Lesion involving the T7 posterior elements most concerning for a
metastasis given the history of breast cancer. No evidence of
epidural tumor.
2. Minimal thoracic spondylosis without stenosis.

## 2020-07-10 ENCOUNTER — Other Ambulatory Visit: Payer: Managed Care, Other (non HMO)

## 2020-07-10 ENCOUNTER — Ambulatory Visit: Payer: Managed Care, Other (non HMO)

## 2020-07-10 ENCOUNTER — Ambulatory Visit: Payer: Managed Care, Other (non HMO) | Admitting: Oncology

## 2020-07-13 ENCOUNTER — Other Ambulatory Visit: Payer: Self-pay

## 2020-07-13 ENCOUNTER — Encounter: Payer: Self-pay | Admitting: Oncology

## 2020-07-13 ENCOUNTER — Telehealth: Payer: Self-pay

## 2020-07-13 DIAGNOSIS — C50411 Malignant neoplasm of upper-outer quadrant of right female breast: Secondary | ICD-10-CM

## 2020-07-13 DIAGNOSIS — Z170 Estrogen receptor positive status [ER+]: Secondary | ICD-10-CM

## 2020-07-13 NOTE — Telephone Encounter (Signed)
Patient sent a MyChart message in regards to an abnormal MRI obtained by Dr. Jefm Bryant.  Dr. Tasia Catchings reviewed her MRI and she would like the patient be scheduled PET- breast cancer, spine lesion. Suspect Mets. To see Dr. Tasia Catchings after PET.  Also will get the appt with Dr. Baruch Gouty r/s to after the PET.  Will send a scheduling pool message to have PET and MD scheduled.

## 2020-07-14 NOTE — Telephone Encounter (Signed)
Patient's 8/6 lab/MD/Zometa appt was moved to 8/12 for MD due to addition of PET scan.  MD would like for patient to receive Zometa as well.  Please make her appt after the PET to include lab/MD/Zometa.  Thanks!

## 2020-07-14 NOTE — Telephone Encounter (Signed)
Done

## 2020-07-16 ENCOUNTER — Ambulatory Visit: Payer: Medicare HMO | Admitting: Radiation Oncology

## 2020-07-16 ENCOUNTER — Telehealth: Payer: Self-pay | Admitting: *Deleted

## 2020-07-16 NOTE — Telephone Encounter (Signed)
Cx 07/20/20 PET scan due to pending Auth per [facility] PET was cx as requested. Pt was notified on 07/16/20 and made aware.

## 2020-07-17 ENCOUNTER — Inpatient Hospital Stay: Payer: Medicare HMO

## 2020-07-17 ENCOUNTER — Inpatient Hospital Stay: Payer: Medicare HMO | Admitting: Oncology

## 2020-07-20 ENCOUNTER — Encounter: Payer: Self-pay | Admitting: Oncology

## 2020-07-20 ENCOUNTER — Ambulatory Visit: Admission: RE | Admit: 2020-07-20 | Payer: Medicare HMO | Source: Ambulatory Visit

## 2020-07-20 ENCOUNTER — Ambulatory Visit
Admission: RE | Admit: 2020-07-20 | Discharge: 2020-07-20 | Disposition: A | Discharge status: Home / Self Care | Payer: Medicare HMO | Source: Ambulatory Visit | Attending: Oncology | Admitting: Oncology

## 2020-07-20 ENCOUNTER — Other Ambulatory Visit: Payer: Self-pay

## 2020-07-20 DIAGNOSIS — C7951 Secondary malignant neoplasm of bone: Secondary | ICD-10-CM | POA: Insufficient documentation

## 2020-07-20 DIAGNOSIS — Z170 Estrogen receptor positive status [ER+]: Secondary | ICD-10-CM | POA: Insufficient documentation

## 2020-07-20 DIAGNOSIS — C50411 Malignant neoplasm of upper-outer quadrant of right female breast: Secondary | ICD-10-CM | POA: Diagnosis not present

## 2020-07-20 LAB — GLUCOSE, CAPILLARY: Glucose-Capillary: 103 mg/dL — ABNORMAL HIGH (ref 70–99)

## 2020-07-20 IMAGING — CT NM PET TUM IMG INITIAL (PI) SKULL BASE T - THIGH
1 of 9 series · 3 of 25 positions shown · non-contrast
Comparison: CT chest [DATE] and CT chest abdomen pelvis
[DATE].

CLINICAL DATA: Initial treatment strategy for breast cancer, spine
lesion.

EXAM:
NUCLEAR MEDICINE PET SKULL BASE TO THIGH
TECHNIQUE: 10.2 mCi F-18 FDG was injected intravenously. Full-ring PET imaging
was performed from the skull base to thigh after the radiotracer. CT
data was obtained and used for attenuation correction and anatomic
localization.
Fasting blood glucose: 103 mg/dl

[Series 3: ct wb 5.0 b30f · axial · 5.0mm · 0.98mm/px · z∈[-980,-113]mm · 3 of 290 slices shown]
[im 1/290  brain]
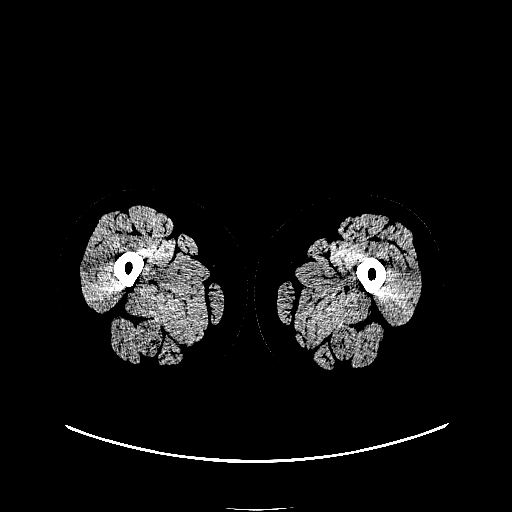
[im 145/290  brain]
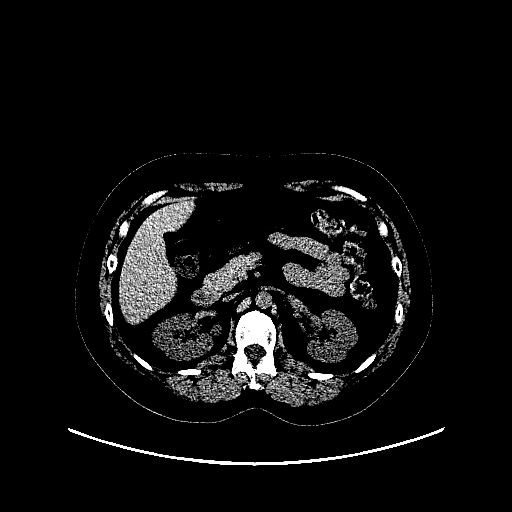
[im 290/290  brain]
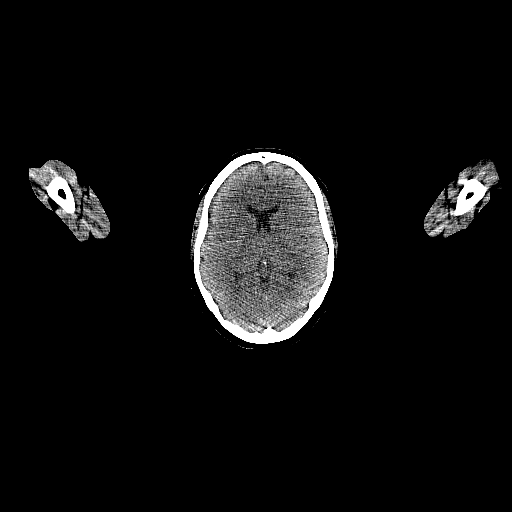

[3 of 25 positions shown; findings below may reference images not displayed]

FINDINGS: Mediastinal blood pool activity: SUV max

Liver activity: SUV max NA

NECK:

No abnormal hypermetabolism.

Incidental CT findings:

None.

CHEST:

No hypermetabolic mediastinal, hilar or axillary lymph nodes. No
hypermetabolic pulmonary nodules.

Incidental CT findings:

Heart size normal. No pericardial or pleural effusion. Surgical
clips in the right axilla. Bilateral mastectomies.

ABDOMEN/PELVIS:

No abnormal hypermetabolism in the liver, adrenal glands, spleen or
pancreas. No hypermetabolic lymph nodes.

Incidental CT findings:

Liver is unremarkable. Cholecystectomy. Adrenal glands, kidneys,
spleen, pancreas, stomach and bowel are unremarkable. Small
umbilical hernia contains fat. No free fluid.

SKELETON:

A lytic lesion in the right lamina of T7 has an SUV max of 8.4. No
additional hypermetabolic osseous lesions.

Incidental CT findings:

None.
IMPRESSION: Hypermetabolic metastasis involving the posterior elements of T7. No
additional evidence of metastatic disease in the neck, chest,
abdomen or pelvis.

## 2020-07-20 MED ORDER — FLUDEOXYGLUCOSE F - 18 (FDG) INJECTION
9.6000 | Freq: Once | INTRAVENOUS | Status: AC | PRN
  Administered 2020-07-20: 10.2 via INTRAVENOUS

## 2020-07-22 ENCOUNTER — Other Ambulatory Visit: Payer: Self-pay

## 2020-07-22 ENCOUNTER — Encounter: Payer: Self-pay | Admitting: Oncology

## 2020-07-22 ENCOUNTER — Inpatient Hospital Stay: Payer: Medicare HMO | Admitting: Oncology

## 2020-07-22 ENCOUNTER — Inpatient Hospital Stay: Payer: Medicare HMO | Attending: Oncology

## 2020-07-22 ENCOUNTER — Inpatient Hospital Stay: Payer: Medicare HMO

## 2020-07-22 VITALS — BP 91/66 | HR 108 | Temp 96.8°F | Resp 16 | Wt 182.4 lb

## 2020-07-22 DIAGNOSIS — G620 Drug-induced polyneuropathy: Secondary | ICD-10-CM | POA: Diagnosis not present

## 2020-07-22 DIAGNOSIS — F17210 Nicotine dependence, cigarettes, uncomplicated: Secondary | ICD-10-CM | POA: Diagnosis not present

## 2020-07-22 DIAGNOSIS — M899 Disorder of bone, unspecified: Secondary | ICD-10-CM | POA: Diagnosis not present

## 2020-07-22 DIAGNOSIS — Z79811 Long term (current) use of aromatase inhibitors: Secondary | ICD-10-CM

## 2020-07-22 DIAGNOSIS — C50411 Malignant neoplasm of upper-outer quadrant of right female breast: Secondary | ICD-10-CM

## 2020-07-22 DIAGNOSIS — Z90722 Acquired absence of ovaries, bilateral: Secondary | ICD-10-CM | POA: Insufficient documentation

## 2020-07-22 DIAGNOSIS — Z8049 Family history of malignant neoplasm of other genital organs: Secondary | ICD-10-CM | POA: Diagnosis not present

## 2020-07-22 DIAGNOSIS — Z8052 Family history of malignant neoplasm of bladder: Secondary | ICD-10-CM | POA: Diagnosis not present

## 2020-07-22 DIAGNOSIS — Z170 Estrogen receptor positive status [ER+]: Secondary | ICD-10-CM | POA: Insufficient documentation

## 2020-07-22 DIAGNOSIS — Z808 Family history of malignant neoplasm of other organs or systems: Secondary | ICD-10-CM | POA: Diagnosis not present

## 2020-07-22 DIAGNOSIS — T451X5A Adverse effect of antineoplastic and immunosuppressive drugs, initial encounter: Secondary | ICD-10-CM | POA: Diagnosis not present

## 2020-07-22 DIAGNOSIS — Z9013 Acquired absence of bilateral breasts and nipples: Secondary | ICD-10-CM | POA: Insufficient documentation

## 2020-07-22 DIAGNOSIS — F329 Major depressive disorder, single episode, unspecified: Secondary | ICD-10-CM | POA: Insufficient documentation

## 2020-07-22 LAB — CBC WITH DIFFERENTIAL/PLATELET
Abs Immature Granulocytes: 0.02 10*3/uL (ref 0.00–0.07)
Basophils Absolute: 0 10*3/uL (ref 0.0–0.1)
Basophils Relative: 0 %
Eosinophils Absolute: 0.1 10*3/uL (ref 0.0–0.5)
Eosinophils Relative: 1 %
HCT: 38.1 % (ref 36.0–46.0)
Hemoglobin: 12.8 g/dL (ref 12.0–15.0)
Immature Granulocytes: 0 %
Lymphocytes Relative: 34 %
Lymphs Abs: 2.9 10*3/uL (ref 0.7–4.0)
MCH: 29.8 pg (ref 26.0–34.0)
MCHC: 33.6 g/dL (ref 30.0–36.0)
MCV: 88.8 fL (ref 80.0–100.0)
Monocytes Absolute: 0.6 10*3/uL (ref 0.1–1.0)
Monocytes Relative: 7 %
Neutro Abs: 4.9 10*3/uL (ref 1.7–7.7)
Neutrophils Relative %: 58 %
Platelets: 309 10*3/uL (ref 150–400)
RBC: 4.29 MIL/uL (ref 3.87–5.11)
RDW: 13.5 % (ref 11.5–15.5)
WBC: 8.5 10*3/uL (ref 4.0–10.5)
nRBC: 0 % (ref 0.0–0.2)

## 2020-07-22 LAB — COMPREHENSIVE METABOLIC PANEL
ALT: 22 U/L (ref 0–44)
AST: 25 U/L (ref 15–41)
Albumin: 4.5 g/dL (ref 3.5–5.0)
Alkaline Phosphatase: 90 U/L (ref 38–126)
Anion gap: 9 (ref 5–15)
BUN: 17 mg/dL (ref 6–20)
CO2: 25 mmol/L (ref 22–32)
Calcium: 9.2 mg/dL (ref 8.9–10.3)
Chloride: 105 mmol/L (ref 98–111)
Creatinine, Ser: 0.75 mg/dL (ref 0.44–1.00)
GFR calc Af Amer: >60 mL/min (ref >60)
GFR calc non Af Amer: >60 mL/min (ref >60)
Glucose, Bld: 107 mg/dL — ABNORMAL HIGH (ref 70–99)
Potassium: 3.8 mmol/L (ref 3.5–5.1)
Sodium: 139 mmol/L (ref 135–145)
Total Bilirubin: 0.5 mg/dL (ref 0.3–1.2)
Total Protein: 7.9 g/dL (ref 6.5–8.1)

## 2020-07-22 NOTE — Progress Notes (Signed)
Hematology/Oncology Follow Up Note [facility] [phone] [phone]  Patient Care Team: Marguerita Merles, MD as PCP - General (Family Medicine) End, Harrell Gave, MD as PCP - Cardiology (Cardiology) Rico Junker, RN as Oncology Nurse Navigator Earlie Server, MD as Consulting Physician (Oncology) Bary Castilla, Forest Gleason, MD (General Surgery) Noreene Filbert, MD as Referring Physician (Radiation Oncology)   Name of the patient: Theresa Chaney  [id number]  1981-02-23   Date of visit: 07/22/20 REASON FOR VISIT Follow up for Assessment prior to chemotherapy treatment of breast cancer  Oncology History Cancer TREATMENT Neoadjuvant ddAC +one dose of Taxol, due to lack of response, surgery was offered. Case was discussed on breast tumor board. 03/19/2018 S/p right mastectomy and right axillary dissection, immediate breast reconstruction with placement of expanders.  ypT3 ypN2, + lymphovascular invasion,  Grade 3, margin is negative, close. ER 90%, PR 0%, HER2 IHC negative.  Also had elective bilateral salpingo-oophorectomy..  # 06/11/2018 s/p 11 cycles Taxol adjuvantly. Tolerated well.  # She has obtained dental clearance for starting Zometa.  S/p Zometa on 6/3/ 2019 # s/p adjuvant right chest wall radiation, finished 10/10/2018 #06/03/2019 underwent elective left prophylactic mastectomy and sentinel lymph node biopsy of left axilla.  # 06/03/2019 underwent elective left prophylactic mastectomy and sentinel lymph node biopsy of left axilla. Pathology negative for malignancy. Status post Mediport removal on 06/03/2019. She also underwent right implant removal on 06/03/2019. She also underwent right implant removal on 06/03/2019. #Negative genetic testing  #  chemotherapy-induced neuropathy, bilateral fingertips and lower extremities.  Patient is on Lyrica and nortriptyline.  Follows up with neurology. She follows up with lymphedema clinic.  INTERVAL HISTORY 39  yo female with above oncology history reviewed by me presents for follow-up of management of breast cancer.  Patient is on letrozole 2.5 mg daily. Has had increased back pain for the past 3 months.  Patient follows up with rheumatology Dr. Jefm Bryant will obtain MRI of thoracic spine.  07/07/2020, MRI thoracic spine without contrast showed lesions involving the T7 posterior elements most concerning for metastatic lesion.  No evidence of epidural tumor.  Minimal thoracic spondylosis without stenosis. MRI was reviewed by me and a PET scan was obtained for further evaluation. 07/20/2020, PET scan showed hypermetabolic metastasis involving the posterior element of T7, no additional evidence of metastasis in the neck, chest, abdomen or pelvis.  Today patient was accompanied by her husband for discussion of results and management plans.   Review of Systems  Constitutional: Negative for chills, fever, malaise/fatigue and weight loss.  HENT: Negative for sore throat.   Eyes: Negative for redness.  Respiratory: Negative for cough, shortness of breath and wheezing.   Cardiovascular: Negative for chest pain, palpitations and leg swelling.  Gastrointestinal: Negative for abdominal pain, blood in stool, nausea and vomiting.  Genitourinary: Negative for dysuria.  Musculoskeletal: Positive for back pain and joint pain. Negative for myalgias.  Skin: Negative for rash.  Neurological: Positive for tingling. Negative for dizziness and tremors.  Endo/Heme/Allergies: Does not bruise/bleed easily.  Psychiatric/Behavioral: Negative for hallucinations.    No Known Allergies  Patient Active Problem List   Diagnosis Date Noted  . Fracture of neck of metacarpal bone 05/14/2019  . Chronic fatigue 04/09/2019  . Polyarthralgia 04/09/2019  . Status post right breast reconstruction 02/26/2019  . Status post right mastectomy 02/26/2019  . Mastalgia 02/15/2019  . Shortness of breath 08/23/2018  . Nonischemic  cardiomyopathy ([place]) 08/23/2018  . Preprocedural cardiovascular examination 08/23/2018  .  Tachycardia 08/23/2018  . Palpitations 08/23/2018  . Estrogen receptor positive status (ER+) 04/04/2018  . Acquired absence of right breast and nipple 04/03/2018  . Breast cancer of upper-outer quadrant of right female breast ([place]) 03/19/2018  . Family history of cancer   . Malignant neoplasm of overlapping sites of right breast in female, estrogen receptor positive ([place]) 10/19/2017  . Gastroesophageal reflux disease without esophagitis 02/24/2017  . Generalized anxiety disorder 10/03/2014  . Headache 10/03/2014     Past Medical History:  Diagnosis Date  . Anemia   . Arthritis   . BRCA negative 11/26/2017  . Breast cancer ([place]) 10/11/2017   Multifocal, ER positive, PR negative, HER-2 negative. ypT3 ypN2a 8.7 cm, 4/15 nodes  . Cardiomyopathy ([place])    a. 10/2017 Echo: EF 60-65%, no rwma, Gr1 DD, nl RV size/fxn; b. 04/2018 Echo: EF 55-60%, no rwma, Nl RV size/fxn; c. 08/2018 Echo: EF 45%, diff HK, ? HK of antsept wall. Gr1 DD. Mild MR. Mild LAE/RAE. Mod dil RV.   [name] Chronic bronchitis ([place]) 11/2017  . COPD (chronic obstructive pulmonary disease) ([place])    MILD PER CXR  . Depression   . Family history of cancer   . GERD (gastroesophageal reflux disease)   . Headache    MIGRAINES  . Heart murmur    ASYMPTOMATIC  . Personal history of chemotherapy    current for right breast ca     Past Surgical History:  Procedure Laterality Date  . AXILLARY LYMPH NODE DISSECTION Right 03/19/2018   Procedure: AXILLARY LYMPH NODE DISSECTION;  Surgeon: Robert Bellow, MD;  Location: [facility];  Service: General;  Laterality: Right;  . BREAST BIOPSY Right 10/11/2017   12:30 posterior coil clip invasive mammary carcinoma  . BREAST BIOPSY Right 10/11/2017   11:30 middle depth ribbon clip DCIS  . BREAST BIOPSY Right 10/11/2017   5:30 anterior depth x shape invasive ductal carcinoma  . BREAST IMPLANT REMOVAL  Right 06/03/2019   Procedure: REMOVAL OF RIGHT BREAST IMPLANTS;  Surgeon: Wallace Going, DO;  Location: [facility];  Service: Plastics;  Laterality: Right;  . BREAST RECONSTRUCTION WITH PLACEMENT OF TISSUE EXPANDER AND FLEX HD (ACELLULAR HYDRATED DERMIS) Right 03/19/2018   Procedure: BREAST RECONSTRUCTION WITH PLACEMENT OF TISSUE EXPANDER AND FLEX HD (ACELLULAR HYDRATED DERMIS);  Surgeon: Wallace Going, DO;  Location: [facility];  Service: Plastics;  Laterality: Right;  . CHOLECYSTECTOMY N/A 04/27/2020   Procedure: LAPAROSCOPIC CHOLECYSTECTOMY WITH INTRAOPERATIVE CHOLANGIOGRAM;  Surgeon: Robert Bellow, MD;  Location: [facility];  Service: General;  Laterality: N/A;  . ESOPHAGOGASTRODUODENOSCOPY (EGD) WITH PROPOFOL N/A 04/17/2020   Procedure: ESOPHAGOGASTRODUODENOSCOPY (EGD) WITH PROPOFOL;  Surgeon: Robert Bellow, MD;  Location: [facility];  Service: Endoscopy;  Laterality: N/A;  with biopsy  . LAPAROSCOPIC BILATERAL SALPINGO OOPHERECTOMY Bilateral 03/19/2018   Procedure: LAPAROSCOPIC BILATERAL SALPINGO OOPHORECTOMY;  Surgeon: Benjaman Kindler, MD;  Location: [facility];  Service: Gynecology;  Laterality: Bilateral;  . MASTECTOMY Right 03/2018  . MASTECTOMY W/ SENTINEL NODE BIOPSY Right 03/19/2018   Procedure: MASTECTOMY WITH SENTINEL LYMPH NODE BIOPSY;  Surgeon: Robert Bellow, MD;  Location: [facility];  Service: General;  Laterality: Right;  . PORT-A-CATH REMOVAL Left 06/03/2019   Procedure: REMOVAL PORT-A-CATH;  Surgeon: Robert Bellow, MD;  Location: [facility];  Service: General;  Laterality: Left;  . PORTACATH PLACEMENT Left 10/24/2017   Procedure: INSERTION PORT-A-CATH;  Surgeon: Robert Bellow, MD;  Location: [facility];  Service: General;  Laterality: Left;  . REMOVAL OF TISSUE  EXPANDER AND PLACEMENT OF IMPLANT Right 07/20/2018   Procedure: REMOVAL OF RIGHT BREAST TISSUE EXPANDER AND PLACEMENT OF IMPLANT;  Surgeon: Wallace Going, DO;  Location: [place];  Service: Plastics;  Laterality: Right;  . SIMPLE MASTECTOMY WITH AXILLARY SENTINEL NODE BIOPSY Left 06/03/2019   Procedure: SIMPLE MASTECTOMY LEFT;  Surgeon: Robert Bellow, MD;  Location: [facility];  Service: General;  Laterality: Left;    Social History   Socioeconomic History  . Marital status: Married    Spouse name: Not on file  . Number of children: Not on file  . Years of education: Not on file  . Highest education level: Not on file  Occupational History  . Occupation: Occupational psychologist    Comment: Therapist, art   Tobacco Use  . Smoking status: Current Every Day Smoker    Packs/day: 0.50    Years: 18.00    Pack years: 9.00    Types: Cigarettes    Start date: 06/21/2018  . Smokeless tobacco: Never Used  Vaping Use  . Vaping Use: Never used  Substance and Sexual Activity  . Alcohol use: No  . Drug use: No  . Sexual activity: Yes    Birth control/protection: Injection  Other Topics Concern  . Not on file  Social History Narrative  . Not on file   Social Determinants of Health   Financial Resource Strain:   . Difficulty of Paying Living Expenses:   Food Insecurity:   . Worried About Charity fundraiser in the Last Year:   . Arboriculturist in the Last Year:   Transportation Needs:   . Film/video editor (Medical):   [name] Lack of Transportation (Non-Medical):   Physical Activity:   . Days of Exercise per Week:   . Minutes of Exercise per Session:   Stress:   . Feeling of Stress :   Social Connections:   . Frequency of Communication with Friends and Family:   . Frequency of Social Gatherings with Friends and Family:   . Attends Religious Services:   . Active Member of Clubs or Organizations:   . Attends Archivist Meetings:   [name] Marital Status:   Intimate Partner Violence:   . Fear of Current or Ex-Partner:   . Emotionally Abused:   [name] Physically Abused:   . Sexually Abused:      Family History  Problem  Relation Age of Onset  . Melanoma Maternal Aunt        other aunts with BCC/SCC/Melanoma  . Diabetes Father   . Hypertension Father   . Hyperlipidemia Father   . Heart attack Father 44       "mild"  . Bladder Cancer Maternal Grandmother   . Cervical cancer Maternal Aunt 64       daughter w/ cervical cancer as well  . Melanoma Maternal Uncle        other uncles with BCC/SCC/Melanoma     Current Outpatient Medications:  .  albuterol (VENTOLIN HFA) 108 (90 Base) MCG/ACT inhaler, Inhale 2 puffs into the lungs every 6 (six) hours as needed for wheezing or shortness of breath. , Disp: , Rfl:  .  buPROPion (WELLBUTRIN SR) 150 MG 12 hr tablet, Take 150 mg by mouth 2 (two) times daily., Disp: , Rfl:  .  Calcium Carbonate-Vitamin D3 (CALCIUM 600/VITAMIN D) 600-400 MG-UNIT TABS, Take 2 tablets by mouth daily., Disp: 180 tablet, Rfl: 0 .  cholecalciferol (VITAMIN D3)  25 MCG (1000 UNIT) tablet, Take 1 tablet (1,000 Units total) by mouth daily., Disp: 90 tablet, Rfl: 0 .  cyanocobalamin (,VITAMIN B-12,) 1000 MCG/ML injection, Inject 1,000 mcg into the skin every 30 (thirty) days., Disp: , Rfl:  .  cyclobenzaprine (FLEXERIL) 5 MG tablet, Take 5 mg by mouth 3 (three) times daily., Disp: , Rfl:  .  escitalopram (LEXAPRO) 20 MG tablet, Take 20 mg by mouth every morning. , Disp: , Rfl:  .  esomeprazole (NEXIUM) 40 MG capsule, Take 40 mg by mouth every morning. , Disp: , Rfl:  .  folic acid (FOLVITE) 1 MG tablet, Take 1 mg by mouth daily., Disp: , Rfl:  .  ibuprofen (ADVIL) 800 MG tablet, Take 800 mg by mouth every 8 (eight) hours as needed for moderate pain. , Disp: , Rfl:  .  letrozole (FEMARA) 2.5 MG tablet, Take 2.5 mg by mouth every morning. , Disp: , Rfl:  .  lidocaine (LIDODERM) 5 %, Place 1 patch onto the skin every 12 (twelve) hours. Remove & Discard patch within 12 hours or as directed by MD, Disp: 10 patch, Rfl: 0 .  loratadine (CLARITIN) 10 MG tablet, Take 10 mg by mouth every morning. , Disp: ,  Rfl:  .  LORazepam (ATIVAN) 1 MG tablet, Take 1 mg by mouth 3 (three) times daily., Disp: , Rfl:  .  Magnesium 400 MG TABS, Take 400 mg by mouth 2 (two) times daily., Disp: , Rfl:  .  meloxicam (MOBIC) 7.5 MG tablet, Take 7.5 mg by mouth 2 (two) times a day., Disp: , Rfl:  .  methocarbamol (ROBAXIN) 500 MG tablet, Take 1 tablet (500 mg total) by mouth every 8 (eight) hours as needed for muscle spasms., Disp: 20 tablet, Rfl: 0 .  methotrexate (RHEUMATREX) 2.5 MG tablet, Take 20 mg by mouth every Sunday. , Disp: , Rfl:  .  metoprolol succinate (TOPROL-XL) 25 MG 24 hr tablet, Take 37.5 mg by mouth every morning. , Disp: , Rfl:  .  nortriptyline (PAMELOR) 10 MG capsule, Take 30 mg by mouth at bedtime. , Disp: , Rfl:  .  phentermine (ADIPEX-P) 37.5 MG tablet, Take 37.5 mg by mouth daily before breakfast. , Disp: , Rfl:  .  pregabalin (LYRICA) 300 MG capsule, Take 1 capsule (300 mg total) by mouth daily. (Patient taking differently: Take 300 mg by mouth every morning. ), Disp: 30 capsule, Rfl: 3 .  promethazine (PHENERGAN) 25 MG tablet, Take 1 tablet (25 mg total) by mouth every 8 (eight) hours as needed for nausea or vomiting., Disp: 90 tablet, Rfl: 0 .  pyridOXINE (VITAMIN B-6) 100 MG tablet, Take 100 mg by mouth daily., Disp: , Rfl:  .  topiramate (TOPAMAX) 50 MG tablet, Take 50 mg by mouth 2 (two) times daily. , Disp: , Rfl:  .  vitamin C (ASCORBIC ACID) 500 MG tablet, Take 500 mg by mouth daily., Disp: , Rfl:  .  HYDROcodone-acetaminophen (NORCO/VICODIN) 5-325 MG tablet, Take 1 tablet by mouth every 4 (four) hours as needed for moderate pain. (Patient not taking: Reported on 07/22/2020), Disp: 10 tablet, Rfl: 0 No current facility-administered medications for this visit.  Facility-Administered Medications Ordered in Other Visits:  .  heparin lock flush 100 unit/mL, 500 Units, Intravenous, Once, Earlie Server, MD .  sodium chloride flush (NS) 0.9 % injection 10 mL, 10 mL, Intravenous, PRN, Earlie Server, MD,  10 mL at 11/19/18 1249 .  sodium chloride flush (NS) 0.9 % injection 10 mL,  10 mL, Intravenous, Once, Earlie Server, MD   Physical exam:  Vitals:   07/22/20 1345  BP: 91/66  Pulse: (!) 108  Resp: 16  Temp: (!) 96.8 F (36 C)  Weight: 182 lb 6.4 oz (82.7 kg)  ECOG 1 Physical Exam Constitutional:      General: She is not in acute distress.    Appearance: She is not diaphoretic.  HENT:     Head: Normocephalic and atraumatic.     Nose: Nose normal.     Mouth/Throat:     Pharynx: No oropharyngeal exudate.  Eyes:     General: No scleral icterus.       Left eye: No discharge.     Conjunctiva/sclera: Conjunctivae normal.     Pupils: Pupils are equal, round, and reactive to light.  Neck:     Vascular: No JVD.  Cardiovascular:     Rate and Rhythm: Normal rate and regular rhythm.     Heart sounds: Normal heart sounds. No murmur heard.   Pulmonary:     Effort: Pulmonary effort is normal. No respiratory distress.     Breath sounds: Normal breath sounds. No wheezing or rales.  Chest:     Chest wall: No tenderness.  Abdominal:     General: Bowel sounds are normal. There is no distension.     Palpations: Abdomen is soft. There is no mass.     Tenderness: There is no abdominal tenderness. There is no rebound.  Musculoskeletal:        General: Normal range of motion.     Cervical back: Normal range of motion and neck supple.  Lymphadenopathy:     Cervical: No cervical adenopathy.  Skin:    General: Skin is warm and dry.     Findings: No erythema or rash.  Neurological:     Mental Status: She is alert and oriented to person, place, and time.     Cranial Nerves: No cranial nerve deficit.     Motor: No abnormal muscle tone.     Coordination: Coordination normal.  Psychiatric:        Mood and Affect: Affect normal.         CMP Latest Ref Rng & Units 07/22/2020  Glucose 70 - 99 mg/dL 107(H)  BUN 6 - 20 mg/dL 17  Creatinine 0.44 - 1.00 mg/dL 0.75  Sodium 135 - 145 mmol/L 139    Potassium 3.5 - 5.1 mmol/L 3.8  Chloride 98 - 111 mmol/L 105  CO2 22 - 32 mmol/L 25  Calcium 8.9 - 10.3 mg/dL 9.2  Total Protein 6.5 - 8.1 g/dL 7.9  Total Bilirubin 0.3 - 1.2 mg/dL 0.5  Alkaline Phos 38 - 126 U/L 90  AST 15 - 41 U/L 25  ALT 0 - 44 U/L 22   CBC Latest Ref Rng & Units 07/22/2020  WBC 4.0 - 10.5 K/uL 8.5  Hemoglobin 12.0 - 15.0 g/dL 12.8  Hematocrit 36 - 46 % 38.1  Platelets 150 - 400 K/uL 309   RADIOGRAPHIC STUDIES: I have personally reviewed the radiological images as listed and agreed with the findings in the report. 08/23/2018 DEXA The BMD measured at Femur Neck Right is 0.900 g/cm2 with a T-score of -1.0. This patient is considered normal according to [facility]) criteria. Site Region Measured Measured WHO Young Adult BMD Date       Age      Classification T-score AP Spine L1-L4 08/23/2018 35.5 Normal -0.8 1.091 g/cm2 DualFemur Neck Right  08/23/2018 35.5 Normal -1.0 0.900 g/cm2  08/15/2018 2D echo Study Conclusions - Left ventricle: The cavity size was mildly dilated. Systolic   function was mildly reduced. The estimated ejection fraction was   45%. Diffuse hypokinesis. Hypokinesis of the anteroseptal   myocardium. Doppler parameters are consistent with abnormal left   ventricular relaxation (grade 1 diastolic dysfunction). - Aortic valve: Valve area (VTI): 1.88 cm^2. Valve area (Vmax): 1.8   cm^2. Valve area (Vmean): 1.74 cm^2. - Mitral valve: There was mild regurgitation. Valve area by   continuity equation (using LVOT flow): 2.22 cm^2. - Left atrium: The atrium was mildly dilated. - Right ventricle: The cavity size was moderately dilated. - Right atrium: The atrium was mildly dilated.  RADIOGRAPHIC STUDIES: I have personally reviewed the radiological images as listed and agreed with the findings in the report. 10/30/2018 Bone scan whole body Non scintigraphic evidence of osseous metastatic disease.  Assessment and plan- Patient  is a 39 y.o. female presents for evaluation of newly diagnosed multicentric right breast cancer.  Cancer Staging Breast cancer of upper-outer quadrant of right female breast [facility]) Staging form: Breast, AJCC 8th Edition - Clinical: G2, ER+, PR+, HER2- - Signed by Earlie Server, MD on 04/05/2018 - Pathologic stage from 04/04/2018: No Stage Recommended (ypT3, pN2, cM0, G3, ER+, PR-, HER2-) - Signed by Earlie Server, MD on 04/04/2018    1. Malignant neoplasm of upper-outer quadrant of right breast in female, estrogen receptor positive ([place])   2. Neuropathy due to chemotherapeutic drug ([place])   3. Aromatase inhibitor use   4. Bone lesion   Cancer Staging Breast cancer of upper-outer quadrant of right female breast ([place]) Staging form: Breast, AJCC 8th Edition - Clinical: G2, ER+, PR+, HER2- - Signed by Earlie Server, MD on 04/05/2018 - Pathologic stage from 04/04/2018: No Stage Recommended (ypT3, pN2, cM0, G3, ER+, PR-, HER2-) - Signed by Earlie Server, MD on 04/04/2018   # Stage IIIA right breast cancer, ER/ PR positive, HER-2 negative status post bilateral oophorectomy, Right mastectomy and right axillary lymph node dissection, status post implant, and implant removal.  Status post left mastectomy and axillary sentinel lymph node biopsy. Status post port removal. Images were independently reviewed by me and discussed with the patient Both MRI thoracic spine and PET scan findings are consistent with metastatic disease. No other suspicious lesions. I recommend patient to proceed with CT-guided biopsy by interventional radiology to confirm distant metastasis and confirm status of estrogen receptor-although bone tissue may reveal false negative findings. I discussed with interventional radiologist who feels that biopsy may be of surgical risk given the anatomy position.  IR may try but if not successful, I will refer patient to neurosurgery for evaluation.  Further management plan depends on work-up results.  For now  continue letrozole 2.5 mg daily.  Patient is status post bilateral oophorectomy.   #Adjuvant bisphosphonate: Patient has been on Zometa every 6 months. I will hold off Zometa today due to possible upcoming thoracic spine biopsy procedure/neurosurgery  #Chemotherapy-induced neuropathy, continue Lyrica and nortriptyline.  Symptoms are stable.  Follow-up with neurology.. . #Depression.  Continue Lexapro 20 mg daily. #Mediport has been removed.  Follow-up to be determined Earlie Server, MD, PhD Hematology Oncology [place] [facility]  07/22/20

## 2020-07-22 NOTE — Progress Notes (Signed)
Patient is here to discuss PET results.

## 2020-07-23 ENCOUNTER — Encounter: Payer: Self-pay | Admitting: Oncology

## 2020-07-23 ENCOUNTER — Ambulatory Visit: Payer: Medicare HMO | Admitting: Radiation Oncology

## 2020-07-23 ENCOUNTER — Ambulatory Visit: Payer: Medicare HMO | Admitting: Oncology

## 2020-07-23 MED ORDER — OXYCODONE HCL 5 MG PO TABS: 5.0000 mg | ORAL_TABLET | Freq: Four times a day (QID) | ORAL | 0 refills | Status: DC | PRN

## 2020-07-23 NOTE — Addendum Note (Signed)
Addended by: Earlie Server on: 07/23/2020 01:46 PM   Modules accepted: Orders

## 2020-07-24 NOTE — Progress Notes (Signed)
Patient on schedule for T7 lesion biopsy on 07/29/2120, spoke with Theresa Chaney on phone with pre procedure questions answered,made aware to be here @ 0930, NPO after 0300 along with driver post procedure for home after discharge. Stated understanding.

## 2020-07-27 ENCOUNTER — Encounter: Payer: Self-pay | Admitting: Oncology

## 2020-07-28 ENCOUNTER — Other Ambulatory Visit: Payer: Self-pay | Admitting: Radiology

## 2020-07-29 ENCOUNTER — Ambulatory Visit
Admission: RE | Admit: 2020-07-29 | Discharge: 2020-07-29 | Disposition: A | Discharge status: Home / Self Care | Payer: Medicare HMO | Source: Ambulatory Visit | Attending: Oncology | Admitting: Oncology

## 2020-07-29 ENCOUNTER — Other Ambulatory Visit: Payer: Self-pay

## 2020-07-29 DIAGNOSIS — Z9013 Acquired absence of bilateral breasts and nipples: Secondary | ICD-10-CM | POA: Insufficient documentation

## 2020-07-29 DIAGNOSIS — Z853 Personal history of malignant neoplasm of breast: Secondary | ICD-10-CM | POA: Diagnosis not present

## 2020-07-29 DIAGNOSIS — Z79899 Other long term (current) drug therapy: Secondary | ICD-10-CM | POA: Diagnosis not present

## 2020-07-29 DIAGNOSIS — D649 Anemia, unspecified: Secondary | ICD-10-CM | POA: Diagnosis not present

## 2020-07-29 DIAGNOSIS — M899 Disorder of bone, unspecified: Secondary | ICD-10-CM | POA: Diagnosis present

## 2020-07-29 DIAGNOSIS — Z90722 Acquired absence of ovaries, bilateral: Secondary | ICD-10-CM | POA: Insufficient documentation

## 2020-07-29 DIAGNOSIS — I429 Cardiomyopathy, unspecified: Secondary | ICD-10-CM | POA: Insufficient documentation

## 2020-07-29 DIAGNOSIS — Z9079 Acquired absence of other genital organ(s): Secondary | ICD-10-CM | POA: Diagnosis not present

## 2020-07-29 DIAGNOSIS — C7951 Secondary malignant neoplasm of bone: Secondary | ICD-10-CM | POA: Diagnosis not present

## 2020-07-29 DIAGNOSIS — Z170 Estrogen receptor positive status [ER+]: Secondary | ICD-10-CM | POA: Diagnosis not present

## 2020-07-29 DIAGNOSIS — M1990 Unspecified osteoarthritis, unspecified site: Secondary | ICD-10-CM | POA: Insufficient documentation

## 2020-07-29 DIAGNOSIS — F17210 Nicotine dependence, cigarettes, uncomplicated: Secondary | ICD-10-CM | POA: Diagnosis not present

## 2020-07-29 DIAGNOSIS — Z9049 Acquired absence of other specified parts of digestive tract: Secondary | ICD-10-CM | POA: Insufficient documentation

## 2020-07-29 DIAGNOSIS — J449 Chronic obstructive pulmonary disease, unspecified: Secondary | ICD-10-CM | POA: Insufficient documentation

## 2020-07-29 DIAGNOSIS — Z809 Family history of malignant neoplasm, unspecified: Secondary | ICD-10-CM | POA: Diagnosis not present

## 2020-07-29 DIAGNOSIS — K219 Gastro-esophageal reflux disease without esophagitis: Secondary | ICD-10-CM | POA: Insufficient documentation

## 2020-07-29 DIAGNOSIS — C50411 Malignant neoplasm of upper-outer quadrant of right female breast: Secondary | ICD-10-CM

## 2020-07-29 LAB — CBC
HCT: 36.9 % (ref 36.0–46.0)
Hemoglobin: 12.1 g/dL (ref 12.0–15.0)
MCH: 30.3 pg (ref 26.0–34.0)
MCHC: 32.8 g/dL (ref 30.0–36.0)
MCV: 92.3 fL (ref 80.0–100.0)
Platelets: 315 10*3/uL (ref 150–400)
RBC: 4 MIL/uL (ref 3.87–5.11)
RDW: 13.6 % (ref 11.5–15.5)
WBC: 8.5 10*3/uL (ref 4.0–10.5)
nRBC: 0 % (ref 0.0–0.2)

## 2020-07-29 LAB — PROTIME-INR
INR: 0.9 (ref 0.8–1.2)
Prothrombin Time: 11.7 seconds (ref 11.4–15.2)

## 2020-07-29 LAB — APTT: aPTT: 29 seconds (ref 24–36)

## 2020-07-29 IMAGING — CT CT BIOPSY
1 of 2 series · 14 of 32 positions shown, 19 images · non-contrast
Comparison: none

INDICATION: 39-year-old female with a history of breast carcinoma referred for
biopsy of a new FDG avid T7 right lamina lesion.

[Series 2: i-spiral 5.0 b30f · axial · 0.71mm/px · z∈[-156,-54]mm · 14 of 33 slices shown, 19 images]
[im 2/33  soft-tissue]
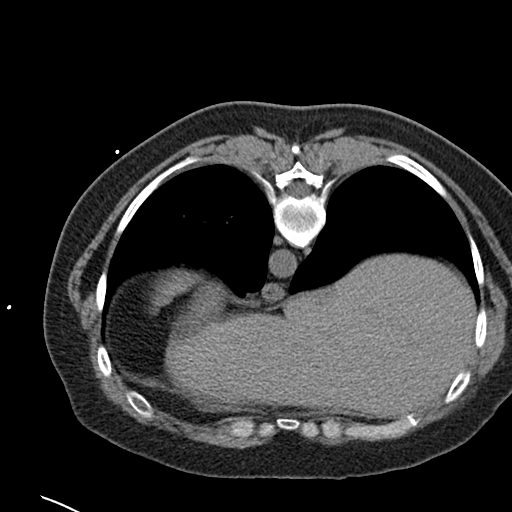
[im 2/33  bone]
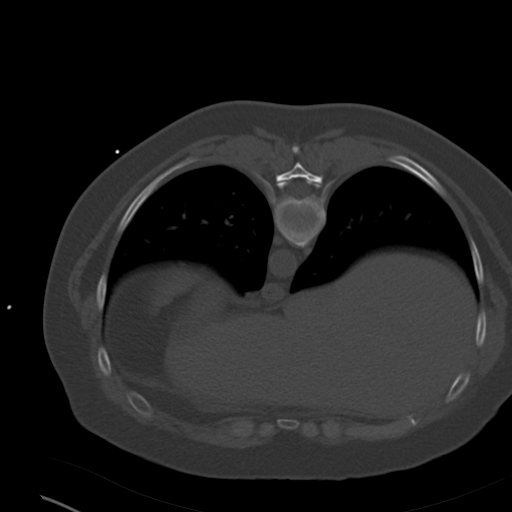
[im 4/33  soft-tissue]
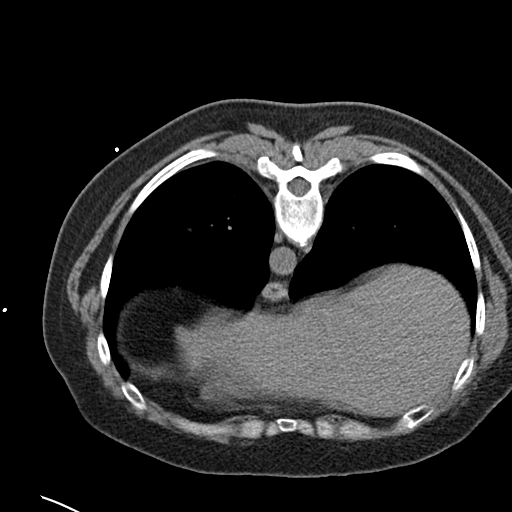
[im 8/33  soft-tissue]
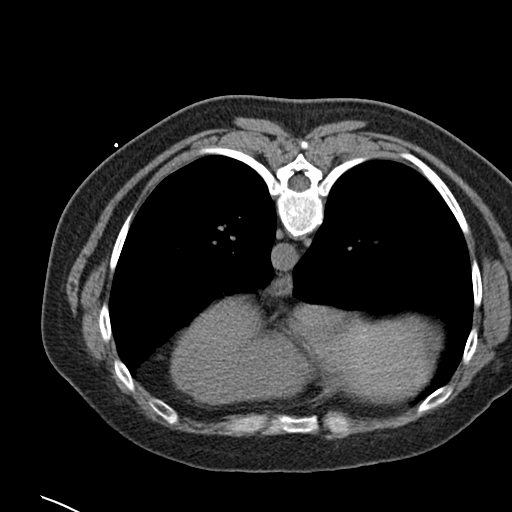
[im 9/33  soft-tissue]
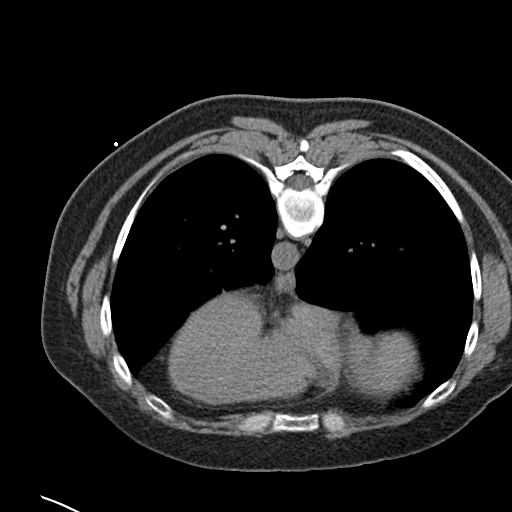
[im 11/33  soft-tissue]
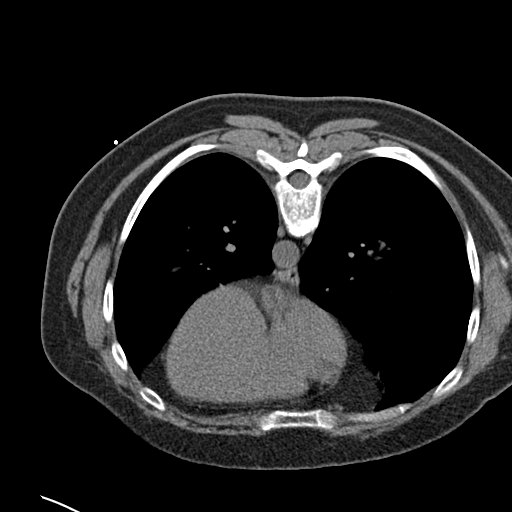
[im 15/33  soft-tissue]
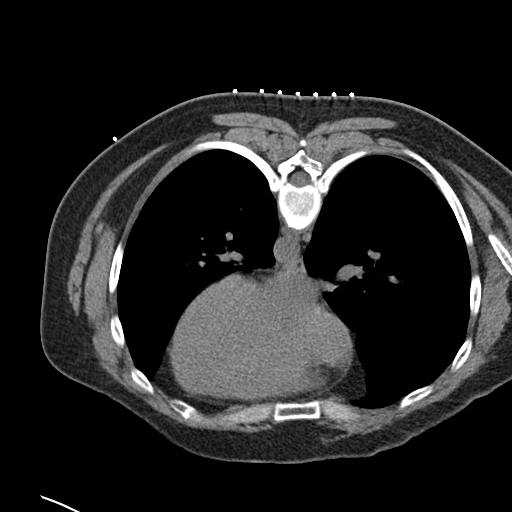
[im 17/33  soft-tissue]
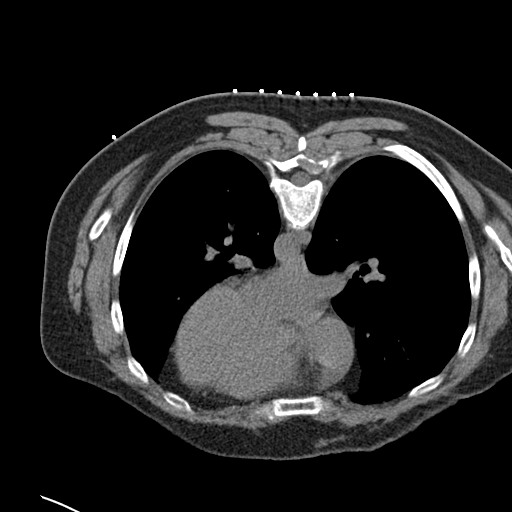
[im 18/33  soft-tissue]
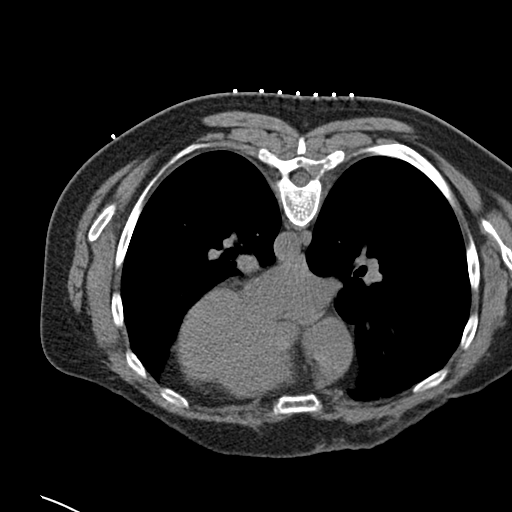
[im 22/33  soft-tissue]
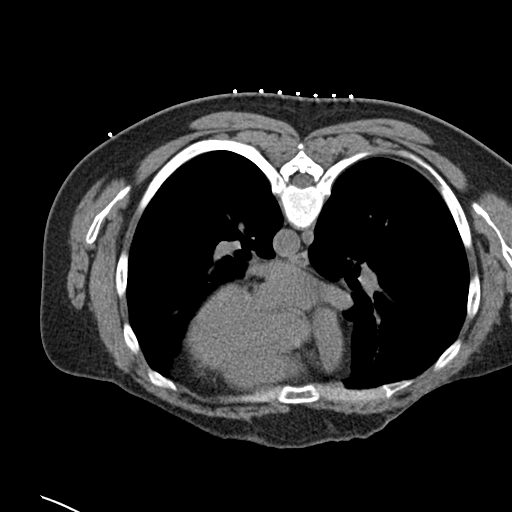
[im 22/33  bone]
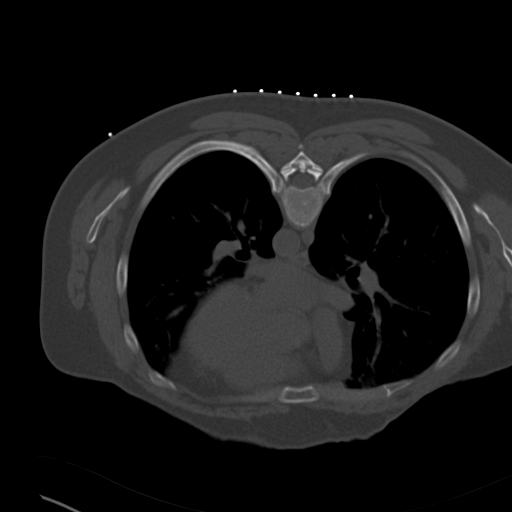
[im 24/33  soft-tissue]
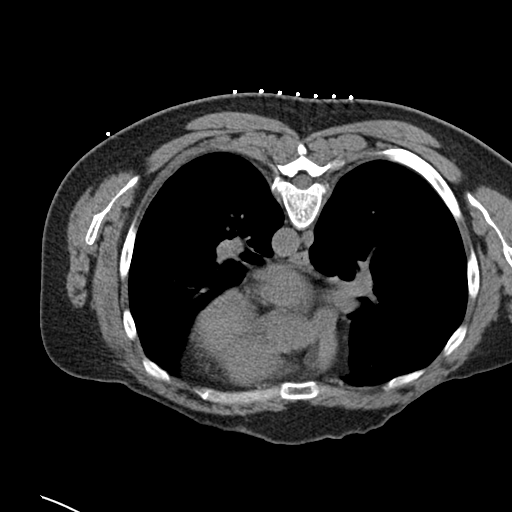
[im 25/33  soft-tissue]
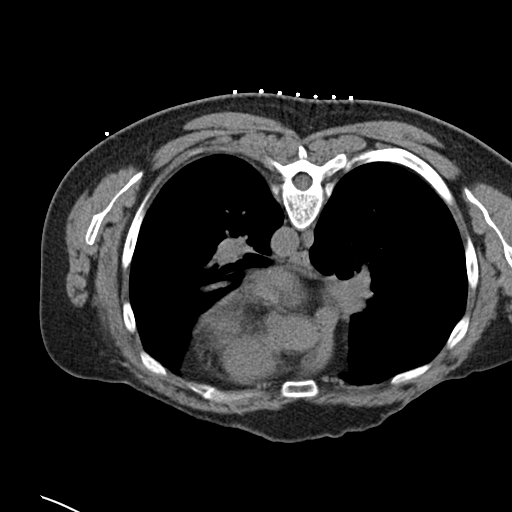
[im 25/33  lung]
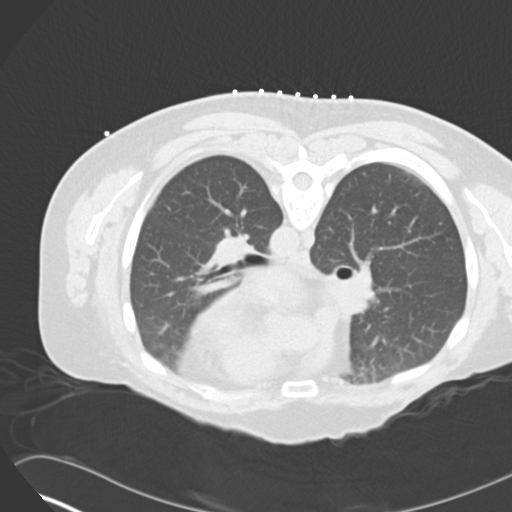
[im 27/33  lung]
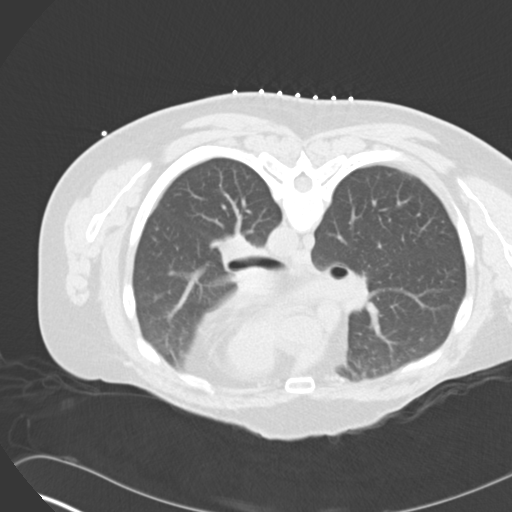
[im 29/33  soft-tissue]
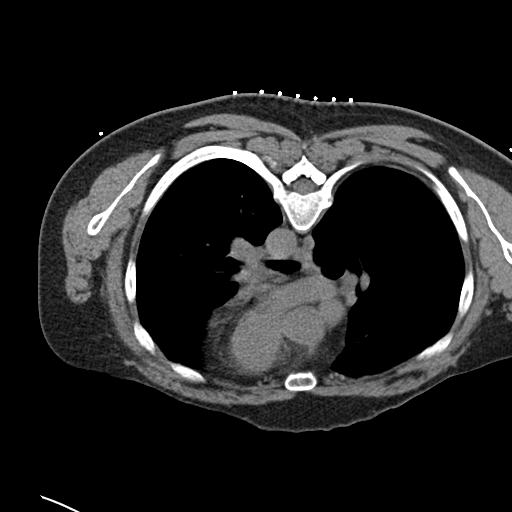
[im 29/33  lung]
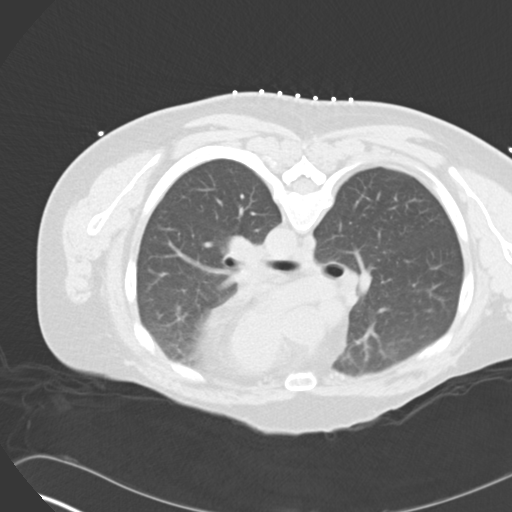
[im 31/33  soft-tissue]
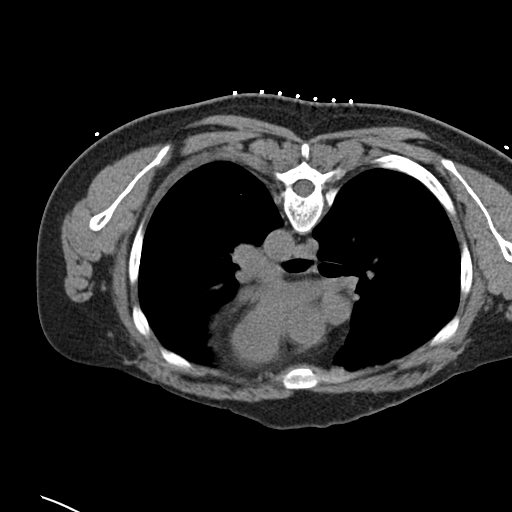
[im 31/33  lung]
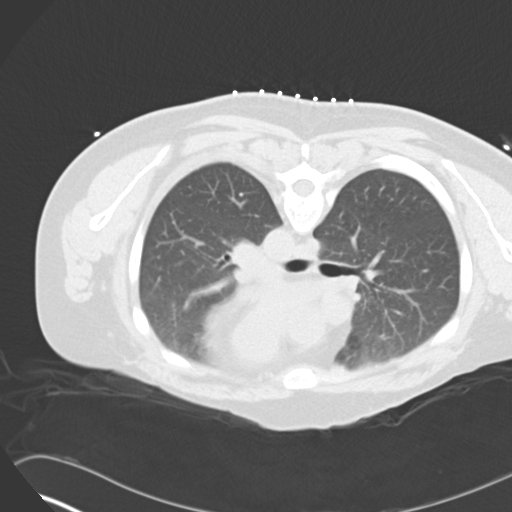

[14 of 32 positions shown; findings below may reference images not displayed]

EXAM:
CT BIOPSY

MEDICATIONS:
None.

ANESTHESIA/SEDATION:
Moderate (conscious) sedation was employed during this procedure. A
total of Versed 2.0 mg and Fentanyl 100 mcg was administered
intravenously.

Moderate Sedation Time: 13 minutes. The patient's level of
consciousness and vital signs were monitored continuously by
radiology nursing throughout the procedure under my direct
supervision.

FLUOROSCOPY TIME:  CT

COMPLICATIONS:
None

PROCEDURE:
Informed written consent was obtained from the patient after a
thorough discussion of the procedural risks, benefits and
alternatives. All questions were addressed. Maximal Sterile Barrier
Technique was utilized including caps, mask, sterile gowns, sterile
gloves, sterile drape, hand hygiene and skin antiseptic. A timeout
was performed prior to the initiation of the procedure.

Patient position prone position on CT gantry table. Scout CT was
acquired for planning purposes.

The patient was then prepped and draped in the usual sterile
fashion. 1% lidocaine was used for local anesthesia.

Introducer needle was then advanced in a longitudinal orientation to
the right lamina. Once we confirmed needle tip position, multiple 18
gauge core biopsy were achieved. These were placed into formalin.

Needle was removed and a final image was stored.

Patient tolerated the procedure well and remained hemodynamically
stable throughout.

No complications were encountered and no significant blood loss.
IMPRESSION: Status post CT-guided right T7 lamina lesion biopsy.

## 2020-07-29 MED ORDER — FENTANYL CITRATE (PF) 100 MCG/2ML IJ SOLN
INTRAMUSCULAR | Status: AC | PRN
  Administered 2020-07-29 (×2): 50 ug via INTRAVENOUS

## 2020-07-29 MED ORDER — MIDAZOLAM HCL 2 MG/2ML IJ SOLN
INTRAMUSCULAR | Status: AC | PRN
  Administered 2020-07-29 (×2): 1 mg via INTRAVENOUS

## 2020-07-29 MED ORDER — SODIUM CHLORIDE 0.9 % IV SOLN: INTRAVENOUS | Status: DC

## 2020-07-29 MED ORDER — MIDAZOLAM HCL 2 MG/2ML IJ SOLN
INTRAMUSCULAR | Status: AC
  Filled 2020-07-29: qty 2

## 2020-07-29 MED ORDER — FENTANYL CITRATE (PF) 100 MCG/2ML IJ SOLN
INTRAMUSCULAR | Status: AC
  Filled 2020-07-29: qty 2

## 2020-07-29 NOTE — Discharge Instructions (Signed)
Moderate Conscious Sedation, Adult, Care After These instructions provide you with information about caring for yourself after your procedure. Your health care provider may also give you more specific instructions. Your treatment has been planned according to current medical practices, but problems sometimes occur. Call your health care provider if you have any problems or questions after your procedure. What can I expect after the procedure? After your procedure, it is common:  To feel sleepy for several hours.  To feel clumsy and have poor balance for several hours.  To have poor judgment for several hours.  To vomit if you eat too soon. Follow these instructions at home: For at least 24 hours after the procedure:   Do not: ? Participate in activities where you could fall or become injured. ? Drive. ? Use heavy machinery. ? Drink alcohol. ? Take sleeping pills or medicines that cause drowsiness. ? Make important decisions or sign legal documents. ? Take care of children on your own.  Rest. Eating and drinking  Follow the diet recommended by your health care provider.  If you vomit: ? Drink water, juice, or soup when you can drink without vomiting. ? Make sure you have little or no nausea before eating solid foods. General instructions  Have a responsible adult stay with you until you are awake and alert.  Take over-the-counter and prescription medicines only as told by your health care provider.  If you smoke, do not smoke without supervision.  Keep all follow-up visits as told by your health care provider. This is important. Contact a health care provider if:  You keep feeling nauseous or you keep vomiting.  You feel light-headed.  You develop a rash.  You have a fever. Get help right away if:  You have trouble breathing. This information is not intended to replace advice given to you by your health care provider. Make sure you discuss any questions you have  with your health care provider. Document Revised: 11/10/2017 Document Reviewed: 03/19/2016 Elsevier Patient Education  2020 Elsevier Inc[employer] Needle Biopsy, Care After These instructions tell you how to care for yourself after your procedure. Your doctor may also give you more specific instructions. Call your doctor if you have any problems or questions. What can I expect after the procedure? After the procedure, it is common to have:  Soreness.  Bruising.  Mild pain. Follow these instructions at home:   Return to your normal activities as told by your doctor. Ask your doctor what activities are safe for you.  Take over-the-counter and prescription medicines only as told by your doctor.  Wash your hands with soap and water before you change your bandage (dressing). If you cannot use soap and water, use hand sanitizer.  Follow instructions from your doctor about: ? How to take care of your puncture site. ? When and how to change your bandage. ? When to remove your bandage.  Check your puncture site every day for signs of infection. Watch for: ? Redness, swelling, or pain. ? Fluid or blood. ? Pus or a bad smell. ? Warmth.  Do not take baths, swim, or use a hot tub until your doctor approves. Ask your doctor if you may take showers. You may only be allowed to take sponge baths.  Keep all follow-up visits as told by your doctor. This is important. Contact a doctor if you have:  A fever.  Redness, swelling, or pain at the puncture site, and it lasts longer than a few days.  Fluid,   blood, or pus coming from the puncture site.  Warmth coming from the puncture site. Get help right away if:  You have a lot of bleeding from the puncture site. Summary  After the procedure, it is common to have soreness, bruising, or mild pain at the puncture site.  Check your puncture site every day for signs of infection, such as redness, swelling, or pain.  Get help right away if you have  severe bleeding from your puncture site. This information is not intended to replace advice given to you by your health care provider. Make sure you discuss any questions you have with your health care provider. Document Revised: 12/11/2017 Document Reviewed: 12/11/2017 Elsevier Patient Education  2020 Elsevier Inc.  

## 2020-07-29 NOTE — Procedures (Signed)
Interventional Radiology Procedure Note  Procedure: Image guided biopsy of right T7 lamina lesion Complications: None EBL: None Recommendations: - Bedrest 1 hours.   - Routine wound care - Follow up pathology - Advance diet   Signed,  Corrie Mckusick, DO

## 2020-07-29 NOTE — H&P (Signed)
Chief Complaint: Patient was seen in consultation today for No chief complaint on file.  at the request of Yu,Zhou  Referring Physician(s): Yu,Zhou  Supervising Physician: Corrie Mckusick  Patient Status: [facility] - Out-pt  History of Present Illness: Theresa Chaney is a 39 y.o. female presenting for image guided biopsy of FDG avid T7 lesion, concerning for metastatic disease of a previously treated breast primary.   She denies any new symptoms.     Past Medical History:  Diagnosis Date  . Anemia   . Arthritis   . BRCA negative 11/26/2017  . Breast cancer ([place]) 10/11/2017   Multifocal, ER positive, PR negative, HER-2 negative. ypT3 ypN2a 8.7 cm, 4/15 nodes  . Cardiomyopathy ([place])    a. 10/2017 Echo: EF 60-65%, no rwma, Gr1 DD, nl RV size/fxn; b. 04/2018 Echo: EF 55-60%, no rwma, Nl RV size/fxn; c. 08/2018 Echo: EF 45%, diff HK, ? HK of antsept wall. Gr1 DD. Mild MR. Mild LAE/RAE. Mod dil RV.   [name] Chronic bronchitis ([place]) 11/2017  . COPD (chronic obstructive pulmonary disease) ([place])    MILD PER CXR  . Depression   . Family history of cancer   . GERD (gastroesophageal reflux disease)   . Headache    MIGRAINES  . Heart murmur    ASYMPTOMATIC  . Personal history of chemotherapy    current for right breast ca    Past Surgical History:  Procedure Laterality Date  . AXILLARY LYMPH NODE DISSECTION Right 03/19/2018   Procedure: AXILLARY LYMPH NODE DISSECTION;  Surgeon: Robert Bellow, MD;  Location: [facility] ORS;  Service: General;  Laterality: Right;  . BREAST BIOPSY Right 10/11/2017   12:30 posterior coil clip invasive mammary carcinoma  . BREAST BIOPSY Right 10/11/2017   11:30 middle depth ribbon clip DCIS  . BREAST BIOPSY Right 10/11/2017   5:30 anterior depth x shape invasive ductal carcinoma  . BREAST IMPLANT REMOVAL Right 06/03/2019   Procedure: REMOVAL OF RIGHT BREAST IMPLANTS;  Surgeon: Wallace Going, DO;  Location: [facility] ORS;  Service: Plastics;  Laterality:  Right;  . BREAST RECONSTRUCTION WITH PLACEMENT OF TISSUE EXPANDER AND FLEX HD (ACELLULAR HYDRATED DERMIS) Right 03/19/2018   Procedure: BREAST RECONSTRUCTION WITH PLACEMENT OF TISSUE EXPANDER AND FLEX HD (ACELLULAR HYDRATED DERMIS);  Surgeon: Wallace Going, DO;  Location: [facility] ORS;  Service: Plastics;  Laterality: Right;  . CHOLECYSTECTOMY N/A 04/27/2020   Procedure: LAPAROSCOPIC CHOLECYSTECTOMY WITH INTRAOPERATIVE CHOLANGIOGRAM;  Surgeon: Robert Bellow, MD;  Location: [facility] ORS;  Service: General;  Laterality: N/A;  . ESOPHAGOGASTRODUODENOSCOPY (EGD) WITH PROPOFOL N/A 04/17/2020   Procedure: ESOPHAGOGASTRODUODENOSCOPY (EGD) WITH PROPOFOL;  Surgeon: Robert Bellow, MD;  Location: [facility];  Service: Endoscopy;  Laterality: N/A;  with biopsy  . LAPAROSCOPIC BILATERAL SALPINGO OOPHERECTOMY Bilateral 03/19/2018   Procedure: LAPAROSCOPIC BILATERAL SALPINGO OOPHORECTOMY;  Surgeon: Benjaman Kindler, MD;  Location: [facility] ORS;  Service: Gynecology;  Laterality: Bilateral;  . MASTECTOMY Right 03/2018  . MASTECTOMY W/ SENTINEL NODE BIOPSY Right 03/19/2018   Procedure: MASTECTOMY WITH SENTINEL LYMPH NODE BIOPSY;  Surgeon: Robert Bellow, MD;  Location: [facility] OR[facility];  Service: General;  Laterality: Right;  . PORT-A-CATH REMOVAL Left 06/03/2019   Procedure: REMOVAL PORT-A-CATH;  Surgeon: Robert Bellow, MD;  Location: [facility];  Service: General;  Laterality: Left;  . PORTACATH PLACEMENT Left 10/24/2017   Procedure: INSERTION PORT-A-CATH;  Surgeon: Robert Bellow, MD;  Location: [facility];  Service: General;  Laterality: Left;  . REMOVAL OF TISSUE EXPANDER AND PLACEMENT OF  IMPLANT Right 07/20/2018   Procedure: REMOVAL OF RIGHT BREAST TISSUE EXPANDER AND PLACEMENT OF IMPLANT;  Surgeon: Wallace Going, DO;  Location: [facility];  Service: Plastics;  Laterality: Right;  . SIMPLE MASTECTOMY WITH AXILLARY SENTINEL NODE BIOPSY Left 06/03/2019   Procedure: SIMPLE MASTECTOMY LEFT;   Surgeon: Robert Bellow, MD;  Location: [facility];  Service: General;  Laterality: Left;    Allergies: Patient has no known allergies.  Medications: Prior to Admission medications   Medication Sig Start Date End Date Taking? Authorizing Provider  albuterol (VENTOLIN HFA) 108 (90 Base) MCG/ACT inhaler Inhale 2 puffs into the lungs every 6 (six) hours as needed for wheezing or shortness of breath.    Yes [provider]  Calcium Carbonate-Vitamin D3 (CALCIUM 600/VITAMIN D) 600-400 MG-UNIT TABS Take 2 tablets by mouth daily. 10/10/19  Yes Earlie Server, MD  cholecalciferol (VITAMIN D3) 25 MCG (1000 UNIT) tablet Take 1 tablet (1,000 Units total) by mouth daily. 01/13/20  Yes Earlie Server, MD  cyanocobalamin (,VITAMIN B-12,) 1000 MCG/ML injection Inject 1,000 mcg into the skin every 30 (thirty) days. 09/02/19  Yes [provider]  cyclobenzaprine (FLEXERIL) 5 MG tablet Take 5 mg by mouth 3 (three) times daily. 08/06/19  Yes [provider]  escitalopram (LEXAPRO) 20 MG tablet Take 20 mg by mouth every morning.    Yes [provider]  esomeprazole (NEXIUM) 40 MG capsule Take 40 mg by mouth every morning.    Yes [provider]  folic acid (FOLVITE) 1 MG tablet Take 1 mg by mouth daily. 09/02/19  Yes [provider]  ibuprofen (ADVIL) 800 MG tablet Take 800 mg by mouth every 8 (eight) hours as needed for moderate pain.    Yes [provider]  letrozole (FEMARA) 2.5 MG tablet Take 2.5 mg by mouth every morning.    Yes [provider]  lidocaine (LIDODERM) 5 % Place 1 patch onto the skin every 12 (twelve) hours. Remove & Discard patch within 12 hours or as directed by MD 06/10/20 06/10/21 Yes Duffy Bruce, MD  loratadine (CLARITIN) 10 MG tablet Take 10 mg by mouth every morning.    Yes [provider]  LORazepam (ATIVAN) 1 MG tablet Take 1 mg by mouth 3 (three) times daily.   Yes [provider]  Magnesium 400 MG TABS Take  400 mg by mouth 2 (two) times daily.   Yes [provider]  meloxicam (MOBIC) 7.5 MG tablet Take 7.5 mg by mouth 2 (two) times a day.   Yes [provider]  methocarbamol (ROBAXIN) 500 MG tablet Take 1 tablet (500 mg total) by mouth every 8 (eight) hours as needed for muscle spasms. 06/10/20  Yes Duffy Bruce, MD  methotrexate (RHEUMATREX) 2.5 MG tablet Take 20 mg by mouth every Sunday.  09/02/19  Yes [provider]  metoprolol succinate (TOPROL-XL) 25 MG 24 hr tablet Take 37.5 mg by mouth every morning.  07/08/19  Yes [provider]  nortriptyline (PAMELOR) 10 MG capsule Take 30 mg by mouth at bedtime.    Yes [provider]  oxyCODONE (OXY IR/ROXICODONE) 5 MG immediate release tablet Take 1 tablet (5 mg total) by mouth every 6 (six) hours as needed for severe pain. 07/23/20  Yes Earlie Server, MD  phentermine (ADIPEX-P) 37.5 MG tablet Take 37.5 mg by mouth daily before breakfast.  09/17/19  Yes [provider]  pregabalin (LYRICA) 300 MG capsule Take 1 capsule (300 mg total) by mouth  daily. Patient taking differently: Take 300 mg by mouth every morning.  10/31/18  Yes Earlie Server, MD  promethazine (PHENERGAN) 25 MG tablet Take 1 tablet (25 mg total) by mouth every 8 (eight) hours as needed for nausea or vomiting. 04/10/20  Yes Earlie Server, MD  pyridOXINE (VITAMIN B-6) 100 MG tablet Take 100 mg by mouth daily.   Yes [provider]  topiramate (TOPAMAX) 50 MG tablet Take 50 mg by mouth 2 (two) times daily.  11/06/19  Yes [provider]  vitamin C (ASCORBIC ACID) 500 MG tablet Take 500 mg by mouth daily.   Yes [provider]  buPROPion (WELLBUTRIN SR) 150 MG 12 hr tablet Take 150 mg by mouth 2 (two) times daily. Patient not taking: Reported on 07/29/2020    [provider]     Family History  Problem Relation Age of Onset  . Melanoma Maternal Aunt        other aunts with BCC/SCC/Melanoma  . Diabetes Father   .  Hypertension Father   . Hyperlipidemia Father   . Heart attack Father 2       "mild"  . Bladder Cancer Maternal Grandmother   . Cervical cancer Maternal Aunt 64       daughter w/ cervical cancer as well  . Melanoma Maternal Uncle        other uncles with BCC/SCC/Melanoma    Social History   Socioeconomic History  . Marital status: Married    Spouse name: Not on file  . Number of children: Not on file  . Years of education: Not on file  . Highest education level: Not on file  Occupational History  . Occupation: Occupational psychologist    Comment: Therapist, art   Tobacco Use  . Smoking status: Current Every Day Smoker    Packs/day: 0.50    Years: 18.00    Pack years: 9.00    Types: Cigarettes    Start date: 06/21/2018  . Smokeless tobacco: Never Used  Vaping Use  . Vaping Use: Never used  Substance and Sexual Activity  . Alcohol use: No  . Drug use: No  . Sexual activity: Yes    Birth control/protection: Injection, Other-see comments    Comment: has had hysterectomy  Other Topics Concern  . Not on file  Social History Narrative   Lives at home with husband and daughter   Social Determinants of Health   Financial Resource Strain:   . Difficulty of Paying Living Expenses:   Food Insecurity:   . Worried About Charity fundraiser in the Last Year:   . Arboriculturist in the Last Year:   Transportation Needs:   . Film/video editor (Medical):   [name] Lack of Transportation (Non-Medical):   Physical Activity:   . Days of Exercise per Week:   . Minutes of Exercise per Session:   Stress:   . Feeling of Stress :   Social Connections:   . Frequency of Communication with Friends and Family:   . Frequency of Social Gatherings with Friends and Family:   . Attends Religious Services:   . Active Member of Clubs or Organizations:   . Attends Archivist Meetings:   [name] Marital Status:       Review of Systems: A 12 point ROS discussed and  pertinent positives are indicated in the HPI above.  All other systems are negative.  Review of Systems  Vital Signs: BP 113/71  Pulse 86   Temp 97.9 F (36.6 C) (Oral)   Resp 15   Ht _0  (1.702 m)   Wt 83.9 kg   LMP  (LMP Unknown) Comment: Ovaries and tubes removed.April 2019  SpO2 99%   BMI 28.98 kg/m   Physical Exam General: 39 yo female appearing stated age.  Well-developed, well-nourished.  No distress. HEENT: Atraumatic, normocephalic.  Conjugate gaze, extra-ocular motor intact. No scleral icterus or scleral injection. No lesions on external ears, nose, lips, or gums.  Oral mucosa moist, pink.  Neck: Symmetric with no goiter enlargement.  Chest/Lungs:  Symmetric chest with inspiration/expiration.  No labored breathing.  Clear to auscultation with no wheezes, rhonchi, or rales.  Heart:  RRR, with no third heart sounds appreciated. No JVD appreciated.  Abdomen:  Soft, NT/ND, with + bowel sounds.   Genito-urinary: Deferred Neurologic: Alert & Oriented to person, place, and time.   Normal affect and insight.  Appropriate questions.  Moving all 4 extremities with gross sensory intact.        Imaging: MR THORACIC SPINE WO CONTRAST  Result Date: 07/07/2020 CLINICAL DATA:  Thoracic spine pain for 2 months. Right-sided rib pain status post sneezing. History of breast cancer. EXAM: MRI THORACIC SPINE WITHOUT CONTRAST TECHNIQUE: Multiplanar, multisequence MR imaging of the thoracic spine was performed. No intravenous contrast was administered. COMPARISON:  Chest CTA 06/10/2020 FINDINGS: Alignment:  Normal. Vertebrae: No fracture. There is abnormal T1 hypointensity and STIR hyperintensity within the posterior elements at T7 including in the right pars and lamina which are expanded and where there is a lytic lesion on CT with cortical disruption consistent with tumor. Abnormal signal extending into the left-sided posterior elements at T7 as well as the right-sided posterior elements at T8  has an appearance more resembling edema. No epidural tumor is evident on this unenhanced study. Cord:  Normal signal and morphology. Paraspinal and other soft tissues: Unremarkable. Disc levels: Minimal spondylosis in the midthoracic spine. No sizable disc herniation. Ligamentum flavum ossification at T6-7. No spinal or neural foraminal stenosis. IMPRESSION: 1. Lesion involving the T7 posterior elements most concerning for a metastasis given the history of breast cancer. No evidence of epidural tumor. 2. Minimal thoracic spondylosis without stenosis. Electronically Signed   By: Logan Bores M.D.   On: 07/07/2020 09:27   NM PET Image Initial (PI) Skull Base To Thigh  Result Date: 07/20/2020 CLINICAL DATA:  Initial treatment strategy for breast cancer, spine lesion. EXAM: NUCLEAR MEDICINE PET SKULL BASE TO THIGH TECHNIQUE: 10.2 mCi F-18 FDG was injected intravenously. Full-ring PET imaging was performed from the skull base to thigh after the radiotracer. CT data was obtained and used for attenuation correction and anatomic localization. Fasting blood glucose: 103 mg/dl COMPARISON:  CT chest 06/10/2020 and CT chest abdomen pelvis 09/24/2018. FINDINGS: Mediastinal blood pool activity: SUV max 1.9 Liver activity: SUV max NA NECK: No abnormal hypermetabolism. Incidental CT findings: None. CHEST: No hypermetabolic mediastinal, hilar or axillary lymph nodes. No hypermetabolic pulmonary nodules. Incidental CT findings: Heart size normal. No pericardial or pleural effusion. Surgical clips in the right axilla. Bilateral mastectomies. ABDOMEN/PELVIS: No abnormal hypermetabolism in the liver, adrenal glands, spleen or pancreas. No hypermetabolic lymph nodes. Incidental CT findings: Liver is unremarkable. Cholecystectomy. Adrenal glands, kidneys, spleen, pancreas, stomach and bowel are unremarkable. Small umbilical hernia contains fat. No free fluid. SKELETON: A lytic lesion in the right lamina of T7 has an SUV max of 8.4. No  additional hypermetabolic osseous lesions. Incidental CT findings: None.  IMPRESSION: Hypermetabolic metastasis involving the posterior elements of T7. No additional evidence of metastatic disease in the neck, chest, abdomen or pelvis. Electronically Signed   By: Lorin Picket M.D.   On: 07/20/2020 13:38    Labs:  CBC: Recent Labs    04/10/20 1246 06/10/20 1527 07/22/20 1328 07/29/20 0947  WBC 8.0 9.8 8.5 8.5  HGB 11.3* 12.0 12.8 12.1  HCT 34.2* 35.6* 38.1 36.9  PLT 290 310 309 315    COAGS: Recent Labs    07/29/20 0947  INR 0.9  APTT 29    BMP: Recent Labs    01/10/20 1324 01/10/20 1324 04/10/20 1246 04/24/20 1050 06/10/20 1527 07/22/20 1328  NA 137  --  140  --  137 139  K 3.5   < > 3.3* 3.5 3.5 3.8  CL 106  --  106  --  105 105  CO2 24  --  25  --  24 25  GLUCOSE 91  --  115*  --  117* 107*  BUN 14  --  17  --  19 17  CALCIUM 9.3  --  8.8*  --  9.2 9.2  CREATININE 0.80  --  0.85  --  0.92 0.75  GFRNONAA >60  --  >60  --  >60 >60  GFRAA >60  --  >60  --  >60 >60   < > = values in this interval not displayed.    LIVER FUNCTION TESTS: Recent Labs    10/10/19 0936 01/10/20 1324 04/10/20 1246 07/22/20 1328  BILITOT 0.5 0.4 0.6 0.5  AST _0 ALT _1 ALKPHOS 85 94 80 90  PROT 7.8 7.7 7.3 7.9  ALBUMIN 4.4 4.5 4.2 4.5    TUMOR MARKERS: No results for input(s): AFPTM, CEA, CA199, CHROMGRNA in the last 8760 hours.  Assessment and Plan:   39 yo female presents for biopsy of T7 spine lesion, concerning for metastatic disease.   Risks and benefits of biopsy was discussed with the patient and/or patient's family including, but not limited to bleeding, infection, damage to adjacent structures or low yield requiring additional tests.  All of the questions were answered and there is agreement to proceed.  Consent signed and in chart.    Thank you for this interesting consult.  I greatly enjoyed meeting HAIDE KLINKER and look forward to  participating in their care.  A copy of this report was sent to the requesting provider on this date.  Electronically Signed: Corrie Mckusick, DO 07/29/2020, 10:44 AM   I spent a total of  15 Minutes   in face to face in clinical consultation, greater than 50% of which was counseling/coordinating care for image guided bone lesion biopsy, T7

## 2020-08-03 ENCOUNTER — Encounter: Payer: Self-pay | Admitting: Oncology

## 2020-08-04 ENCOUNTER — Telehealth: Payer: Self-pay | Admitting: Oncology

## 2020-08-04 ENCOUNTER — Other Ambulatory Visit: Payer: Self-pay | Admitting: Oncology

## 2020-08-04 MED ORDER — OXYCODONE HCL 5 MG PO TABS: 5.0000 mg | ORAL_TABLET | Freq: Four times a day (QID) | ORAL | 0 refills | Status: DC | PRN

## 2020-08-04 MED ORDER — DEXAMETHASONE 4 MG PO TABS
4.0000 mg | ORAL_TABLET | Freq: Every day | ORAL | 0 refills | Status: DC
Start: 2020-08-04 — End: 2020-08-19

## 2020-08-04 MED ORDER — FENTANYL 25 MCG/HR TD PT72: 1.0000 | MEDICATED_PATCH | TRANSDERMAL | 0 refills | Status: DC

## 2020-08-04 NOTE — Telephone Encounter (Signed)
Pathology result was communicated with patient. T7 biopsy metastatic carcinoma. Awaiting for ER/PR/HER2 status.  Pt reports severe pain which is not relieved by oxycodone 37m Q4 hours.  Suggest to add fentanyl 222m every 72 hours, patient may use oxycodone 1-2 tablets every 6 hours if pain is not controlled.  Add Dexamethasone 60m20maily and hopefully to improve her pain.

## 2020-08-10 ENCOUNTER — Telehealth: Payer: Self-pay

## 2020-08-10 ENCOUNTER — Encounter: Payer: Self-pay | Admitting: Oncology

## 2020-08-10 LAB — SURGICAL PATHOLOGY

## 2020-08-10 NOTE — Telephone Encounter (Signed)
Dr. Tasia Catchings requested Dr. Baruch Gouty see patient ASAP for Radiation of her spine. T7 mets confirmed to be breast origin. Neurosurgeon recommends no surgical intervention  Patient has been scheduled to see Dr. Baruch Gouty on 08/11/20 @ 9:00.  Cori Razor, RN will notify patient of appt details.

## 2020-08-11 ENCOUNTER — Inpatient Hospital Stay (HOSPITAL_BASED_OUTPATIENT_CLINIC_OR_DEPARTMENT_OTHER): Payer: Medicare HMO | Admitting: Oncology

## 2020-08-11 ENCOUNTER — Ambulatory Visit
Admission: RE | Admit: 2020-08-11 | Discharge: 2020-08-11 | Disposition: A | Discharge status: Home / Self Care | Payer: Medicare HMO | Source: Ambulatory Visit | Attending: Radiation Oncology | Admitting: Radiation Oncology

## 2020-08-11 ENCOUNTER — Other Ambulatory Visit: Payer: Self-pay

## 2020-08-11 ENCOUNTER — Encounter: Payer: Self-pay | Admitting: Radiation Oncology

## 2020-08-11 ENCOUNTER — Encounter: Payer: Self-pay | Admitting: Oncology

## 2020-08-11 VITALS — BP 110/80 | HR 100 | Temp 97.0°F | Resp 18 | Wt 189.0 lb

## 2020-08-11 VITALS — BP 110/80 | HR 100 | Temp 97.0°F | Wt 189.0 lb

## 2020-08-11 DIAGNOSIS — C50919 Malignant neoplasm of unspecified site of unspecified female breast: Secondary | ICD-10-CM

## 2020-08-11 DIAGNOSIS — Z7189 Other specified counseling: Secondary | ICD-10-CM

## 2020-08-11 DIAGNOSIS — C50411 Malignant neoplasm of upper-outer quadrant of right female breast: Secondary | ICD-10-CM | POA: Diagnosis not present

## 2020-08-11 DIAGNOSIS — T451X5A Adverse effect of antineoplastic and immunosuppressive drugs, initial encounter: Secondary | ICD-10-CM

## 2020-08-11 DIAGNOSIS — Z170 Estrogen receptor positive status [ER+]: Secondary | ICD-10-CM

## 2020-08-11 DIAGNOSIS — G620 Drug-induced polyneuropathy: Secondary | ICD-10-CM | POA: Insufficient documentation

## 2020-08-11 DIAGNOSIS — Z1731 Malignant neoplasm of unspecified site of unspecified female breast: Secondary | ICD-10-CM | POA: Insufficient documentation

## 2020-08-11 DIAGNOSIS — G893 Neoplasm related pain (acute) (chronic): Secondary | ICD-10-CM

## 2020-08-11 DIAGNOSIS — C7951 Secondary malignant neoplasm of bone: Secondary | ICD-10-CM

## 2020-08-11 NOTE — Progress Notes (Signed)
Hematology/Oncology Follow Up Note [facility] [phone] [phone]  Patient Care Team: Marguerita Merles, MD as PCP - General (Family Medicine) End, Harrell Gave, MD as PCP - Cardiology (Cardiology) Rico Junker, RN as Oncology Nurse Navigator Earlie Server, MD as Consulting Physician (Oncology) Bary Castilla, Forest Gleason, MD (General Surgery) Noreene Filbert, MD as Referring Physician (Radiation Oncology) Noreene Filbert, MD as Radiation Oncologist (Radiation Oncology)   Name of the patient: Theresa Chaney  [id number]  22-May-1981   Date of visit: 08/11/20 REASON FOR VISIT Follow up for Assessment prior to chemotherapy treatment of breast cancer  Oncology History Cancer TREATMENT Neoadjuvant ddAC +one dose of Taxol, due to lack of response, surgery was offered. Case was discussed on breast tumor board. 03/19/2018 S/p right mastectomy and right axillary dissection, immediate breast reconstruction with placement of expanders.  ypT3 ypN2, + lymphovascular invasion,  Grade 3, margin is negative, close. ER 90%, PR 0%, HER2 IHC negative.  Also had elective bilateral salpingo-oophorectomy..  # 06/11/2018 s/p 11 cycles Taxol adjuvantly. Tolerated well.  # She has obtained dental clearance for starting Zometa.  S/p Zometa on 6/3/ 2019 # s/p adjuvant right chest wall radiation, finished 10/10/2018 #06/03/2019 underwent elective left prophylactic mastectomy and sentinel lymph node biopsy of left axilla.  # 06/03/2019 underwent elective left prophylactic mastectomy and sentinel lymph node biopsy of left axilla. Pathology negative for malignancy. Status post Mediport removal on 06/03/2019. She also underwent right implant removal on 06/03/2019. She also underwent right implant removal on 06/03/2019. #Negative genetic testing  #  chemotherapy-induced neuropathy, bilateral fingertips and lower extremities.  Patient is on Lyrica and nortriptyline.  Follows up with  neurology. She follows up with lymphedema clinic.  #  07/07/2020, MRI thoracic spine without contrast showed lesions involving the T7 posterior elements most concerning for metastatic lesion.  No evidence of epidural tumor.  Minimal thoracic spondylosis without stenosis. MRI was reviewed by me and a PET scan was obtained for further evaluation. 07/20/2020, PET scan showed hypermetabolic metastasis involving the posterior element of T7, no additional evidence of metastasis in the neck, chest, abdomen or pelvis.   INTERVAL HISTORY 39 yo female with above oncology history reviewed by me presents for follow-up of management of breast cancer.  Patient is on letrozole 2.5 mg daily, status post bilateral lumpectomy 07/29/2020 T7 lesion biopsy showed metastatic carcinoma, compatible with breast origin.  Receptor status staining showed ER 71-80% positive, PR negative, HER-2 positive IHC 3+ Patient presents to discuss management plan. She continues to have severe back pain.  Currently her pain management is fentanyl patch 25 MCG every 72 hours plus Oxycodone 5 to 10 mg every 6 hours as needed.  She feels that pain has improved however only partially relieved.    Review of Systems  Constitutional: Negative for chills, fever, malaise/fatigue and weight loss.  HENT: Negative for sore throat.   Eyes: Negative for redness.  Respiratory: Negative for cough, shortness of breath and wheezing.   Cardiovascular: Negative for chest pain, palpitations and leg swelling.  Gastrointestinal: Negative for abdominal pain, blood in stool, nausea and vomiting.  Genitourinary: Negative for dysuria.  Musculoskeletal: Positive for back pain and joint pain. Negative for myalgias.  Skin: Negative for rash.  Neurological: Positive for tingling. Negative for dizziness and tremors.  Endo/Heme/Allergies: Does not bruise/bleed easily.  Psychiatric/Behavioral: Negative for hallucinations.    No Known Allergies  Patient Active  Problem List   Diagnosis Date Noted  . Fracture of neck of metacarpal  bone 05/14/2019  . Chronic fatigue 04/09/2019  . Polyarthralgia 04/09/2019  . Status post right breast reconstruction 02/26/2019  . Status post right mastectomy 02/26/2019  . Mastalgia 02/15/2019  . Shortness of breath 08/23/2018  . Nonischemic cardiomyopathy ([place]) 08/23/2018  . Preprocedural cardiovascular examination 08/23/2018  . Tachycardia 08/23/2018  . Palpitations 08/23/2018  . Estrogen receptor positive status (ER+) 04/04/2018  . Acquired absence of right breast and nipple 04/03/2018  . Breast cancer of upper-outer quadrant of right female breast ([place]) 03/19/2018  . Family history of cancer   . Malignant neoplasm of overlapping sites of right breast in female, estrogen receptor positive ([place]) 10/19/2017  . Gastroesophageal reflux disease without esophagitis 02/24/2017  . Generalized anxiety disorder 10/03/2014  . Headache 10/03/2014     Past Medical History:  Diagnosis Date  . Anemia   . Arthritis   . BRCA negative 11/26/2017  . Breast cancer ([place]) 10/11/2017   Multifocal, ER positive, PR negative, HER-2 negative. ypT3 ypN2a 8.7 cm, 4/15 nodes  . Cardiomyopathy ([place])    a. 10/2017 Echo: EF 60-65%, no rwma, Gr1 DD, nl RV size/fxn; b. 04/2018 Echo: EF 55-60%, no rwma, Nl RV size/fxn; c. 08/2018 Echo: EF 45%, diff HK, ? HK of antsept wall. Gr1 DD. Mild MR. Mild LAE/RAE. Mod dil RV.   [name] Chronic bronchitis ([place]) 11/2017  . COPD (chronic obstructive pulmonary disease) ([place])    MILD PER CXR  . Depression   . Family history of cancer   . GERD (gastroesophageal reflux disease)   . Headache    MIGRAINES  . Heart murmur    ASYMPTOMATIC  . Personal history of chemotherapy    current for right breast ca     Past Surgical History:  Procedure Laterality Date  . AXILLARY LYMPH NODE DISSECTION Right 03/19/2018   Procedure: AXILLARY LYMPH NODE DISSECTION;  Surgeon: Robert Bellow, MD;  Location: [facility];   Service: General;  Laterality: Right;  . BREAST BIOPSY Right 10/11/2017   12:30 posterior coil clip invasive mammary carcinoma  . BREAST BIOPSY Right 10/11/2017   11:30 middle depth ribbon clip DCIS  . BREAST BIOPSY Right 10/11/2017   5:30 anterior depth x shape invasive ductal carcinoma  . BREAST IMPLANT REMOVAL Right 06/03/2019   Procedure: REMOVAL OF RIGHT BREAST IMPLANTS;  Surgeon: Wallace Going, DO;  Location: [facility];  Service: Plastics;  Laterality: Right;  . BREAST RECONSTRUCTION WITH PLACEMENT OF TISSUE EXPANDER AND FLEX HD (ACELLULAR HYDRATED DERMIS) Right 03/19/2018   Procedure: BREAST RECONSTRUCTION WITH PLACEMENT OF TISSUE EXPANDER AND FLEX HD (ACELLULAR HYDRATED DERMIS);  Surgeon: Wallace Going, DO;  Location: [facility];  Service: Plastics;  Laterality: Right;  . CHOLECYSTECTOMY N/A 04/27/2020   Procedure: LAPAROSCOPIC CHOLECYSTECTOMY WITH INTRAOPERATIVE CHOLANGIOGRAM;  Surgeon: Robert Bellow, MD;  Location: [facility];  Service: General;  Laterality: N/A;  . ESOPHAGOGASTRODUODENOSCOPY (EGD) WITH PROPOFOL N/A 04/17/2020   Procedure: ESOPHAGOGASTRODUODENOSCOPY (EGD) WITH PROPOFOL;  Surgeon: Robert Bellow, MD;  Location: [facility];  Service: Endoscopy;  Laterality: N/A;  with biopsy  . LAPAROSCOPIC BILATERAL SALPINGO OOPHERECTOMY Bilateral 03/19/2018   Procedure: LAPAROSCOPIC BILATERAL SALPINGO OOPHORECTOMY;  Surgeon: Benjaman Kindler, MD;  Location: [facility];  Service: Gynecology;  Laterality: Bilateral;  . MASTECTOMY Right 03/2018  . MASTECTOMY W/ SENTINEL NODE BIOPSY Right 03/19/2018   Procedure: MASTECTOMY WITH SENTINEL LYMPH NODE BIOPSY;  Surgeon: Robert Bellow, MD;  Location: [facility];  Service: General;  Laterality: Right;  . PORT-A-CATH REMOVAL Left 06/03/2019   Procedure:  REMOVAL PORT-A-CATH;  Surgeon: Robert Bellow, MD;  Location: [facility];  Service: General;  Laterality: Left;  . PORTACATH PLACEMENT Left 10/24/2017   Procedure: INSERTION  PORT-A-CATH;  Surgeon: Robert Bellow, MD;  Location: [facility];  Service: General;  Laterality: Left;  . REMOVAL OF TISSUE EXPANDER AND PLACEMENT OF IMPLANT Right 07/20/2018   Procedure: REMOVAL OF RIGHT BREAST TISSUE EXPANDER AND PLACEMENT OF IMPLANT;  Surgeon: Wallace Going, DO;  Location: [place];  Service: Plastics;  Laterality: Right;  . SIMPLE MASTECTOMY WITH AXILLARY SENTINEL NODE BIOPSY Left 06/03/2019   Procedure: SIMPLE MASTECTOMY LEFT;  Surgeon: Robert Bellow, MD;  Location: [facility];  Service: General;  Laterality: Left;    Social History   Socioeconomic History  . Marital status: Married    Spouse name: Not on file  . Number of children: Not on file  . Years of education: Not on file  . Highest education level: Not on file  Occupational History  . Occupation: Occupational psychologist    Comment: Therapist, art   Tobacco Use  . Smoking status: Current Every Day Smoker    Packs/day: 0.50    Years: 18.00    Pack years: 9.00    Types: Cigarettes    Start date: 06/21/2018  . Smokeless tobacco: Never Used  Vaping Use  . Vaping Use: Never used  Substance and Sexual Activity  . Alcohol use: No  . Drug use: No  . Sexual activity: Yes    Birth control/protection: Injection, Other-see comments    Comment: has had hysterectomy  Other Topics Concern  . Not on file  Social History Narrative   Lives at home with husband and daughter   Social Determinants of Health   Financial Resource Strain:   . Difficulty of Paying Living Expenses: Not on file  Food Insecurity:   . Worried About Charity fundraiser in the Last Year: Not on file  . Ran Out of Food in the Last Year: Not on file  Transportation Needs:   . Lack of Transportation (Medical): Not on file  . Lack of Transportation (Non-Medical): Not on file  Physical Activity:   . Days of Exercise per Week: Not on file  . Minutes of Exercise per Session: Not on file  Stress:   .  Feeling of Stress : Not on file  Social Connections:   . Frequency of Communication with Friends and Family: Not on file  . Frequency of Social Gatherings with Friends and Family: Not on file  . Attends Religious Services: Not on file  . Active Member of Clubs or Organizations: Not on file  . Attends Archivist Meetings: Not on file  . Marital Status: Not on file  Intimate Partner Violence:   . Fear of Current or Ex-Partner: Not on file  . Emotionally Abused: Not on file  . Physically Abused: Not on file  . Sexually Abused: Not on file     Family History  Problem Relation Age of Onset  . Melanoma Maternal Aunt        other aunts with BCC/SCC/Melanoma  . Diabetes Father   . Hypertension Father   . Hyperlipidemia Father   . Heart attack Father 46       "mild"  . Bladder Cancer Maternal Grandmother   . Cervical cancer Maternal Aunt 64       daughter w/ cervical cancer as well  . Melanoma Maternal Uncle  other uncles with BCC/SCC/Melanoma     Current Outpatient Medications:  .  albuterol (VENTOLIN HFA) 108 (90 Base) MCG/ACT inhaler, Inhale 2 puffs into the lungs every 6 (six) hours as needed for wheezing or shortness of breath. , Disp: , Rfl:  .  Calcium Carbonate-Vitamin D3 (CALCIUM 600/VITAMIN D) 600-400 MG-UNIT TABS, Take 2 tablets by mouth daily., Disp: 180 tablet, Rfl: 0 .  cholecalciferol (VITAMIN D3) 25 MCG (1000 UNIT) tablet, Take 1 tablet (1,000 Units total) by mouth daily., Disp: 90 tablet, Rfl: 0 .  cyanocobalamin (,VITAMIN B-12,) 1000 MCG/ML injection, Inject 1,000 mcg into the skin every 30 (thirty) days., Disp: , Rfl:  .  cyclobenzaprine (FLEXERIL) 5 MG tablet, Take 5 mg by mouth 3 (three) times daily., Disp: , Rfl:  .  escitalopram (LEXAPRO) 20 MG tablet, Take 20 mg by mouth every morning. , Disp: , Rfl:  .  esomeprazole (NEXIUM) 40 MG capsule, Take 40 mg by mouth every morning. , Disp: , Rfl:  .  fentaNYL (DURAGESIC) 25 MCG/HR, Place 1 patch onto  the skin every 3 (three) days., Disp: 5 patch, Rfl: 0 .  folic acid (FOLVITE) 1 MG tablet, Take 1 mg by mouth daily., Disp: , Rfl:  .  ibuprofen (ADVIL) 800 MG tablet, Take 800 mg by mouth every 8 (eight) hours as needed for moderate pain. , Disp: , Rfl:  .  letrozole (FEMARA) 2.5 MG tablet, Take 2.5 mg by mouth every morning. , Disp: , Rfl:  .  lidocaine (LIDODERM) 5 %, Place 1 patch onto the skin every 12 (twelve) hours. Remove & Discard patch within 12 hours or as directed by MD, Disp: 10 patch, Rfl: 0 .  loratadine (CLARITIN) 10 MG tablet, Take 10 mg by mouth every morning. , Disp: , Rfl:  .  LORazepam (ATIVAN) 1 MG tablet, Take 1 mg by mouth 3 (three) times daily., Disp: , Rfl:  .  Magnesium 400 MG TABS, Take 400 mg by mouth 2 (two) times daily., Disp: , Rfl:  .  meloxicam (MOBIC) 7.5 MG tablet, Take 7.5 mg by mouth 2 (two) times a day., Disp: , Rfl:  .  methotrexate (RHEUMATREX) 2.5 MG tablet, Take 20 mg by mouth every Sunday. , Disp: , Rfl:  .  metoprolol succinate (TOPROL-XL) 25 MG 24 hr tablet, Take 37.5 mg by mouth every morning. , Disp: , Rfl:  .  nortriptyline (PAMELOR) 10 MG capsule, Take 30 mg by mouth at bedtime. , Disp: , Rfl:  .  oxyCODONE (OXY IR/ROXICODONE) 5 MG immediate release tablet, Take 1-2 tablets (5-10 mg total) by mouth every 6 (six) hours as needed for moderate pain or severe pain., Disp: 60 tablet, Rfl: 0 .  phentermine (ADIPEX-P) 37.5 MG tablet, Take 37.5 mg by mouth daily before breakfast. , Disp: , Rfl:  .  pregabalin (LYRICA) 300 MG capsule, Take 1 capsule (300 mg total) by mouth daily. (Patient taking differently: Take 300 mg by mouth every morning. ), Disp: 30 capsule, Rfl: 3 .  promethazine (PHENERGAN) 25 MG tablet, Take 1 tablet (25 mg total) by mouth every 8 (eight) hours as needed for nausea or vomiting., Disp: 90 tablet, Rfl: 0 .  pyridOXINE (VITAMIN B-6) 100 MG tablet, Take 100 mg by mouth daily., Disp: , Rfl:  .  topiramate (TOPAMAX) 50 MG tablet, Take 50  mg by mouth 2 (two) times daily. , Disp: , Rfl:  .  vitamin C (ASCORBIC ACID) 500 MG tablet, Take 500 mg by mouth daily.,  Disp: , Rfl:  .  dexamethasone (DECADRON) 4 MG tablet, Take 1 tablet (4 mg total) by mouth daily. (Patient not taking: Reported on 08/11/2020), Disp: 4 tablet, Rfl: 0 No current facility-administered medications for this visit.  Facility-Administered Medications Ordered in Other Visits:  .  heparin lock flush 100 unit/mL, 500 Units, Intravenous, Once, Earlie Server, MD .  sodium chloride flush (NS) 0.9 % injection 10 mL, 10 mL, Intravenous, PRN, Earlie Server, MD, 10 mL at 11/19/18 1249 .  sodium chloride flush (NS) 0.9 % injection 10 mL, 10 mL, Intravenous, Once, Earlie Server, MD   Physical exam:  Vitals:   08/11/20 0934  BP: 110/80  Pulse: 100  Resp: 18  Temp: (!) 97 F (36.1 C)  Weight: 189 lb (85.7 kg)  ECOG 1 Physical Exam Constitutional:      General: She is not in acute distress.    Appearance: She is not diaphoretic.  HENT:     Head: Normocephalic and atraumatic.     Nose: Nose normal.     Mouth/Throat:     Pharynx: No oropharyngeal exudate.  Eyes:     General: No scleral icterus.       Left eye: No discharge.     Conjunctiva/sclera: Conjunctivae normal.     Pupils: Pupils are equal, round, and reactive to light.  Neck:     Vascular: No JVD.  Cardiovascular:     Rate and Rhythm: Normal rate and regular rhythm.     Heart sounds: Normal heart sounds. No murmur heard.   Pulmonary:     Effort: Pulmonary effort is normal. No respiratory distress.     Breath sounds: Normal breath sounds. No wheezing or rales.  Chest:     Chest wall: No tenderness.  Abdominal:     General: Bowel sounds are normal. There is no distension.     Palpations: Abdomen is soft. There is no mass.     Tenderness: There is no abdominal tenderness. There is no rebound.  Musculoskeletal:        General: Normal range of motion.     Cervical back: Normal range of motion and neck supple.    Lymphadenopathy:     Cervical: No cervical adenopathy.  Skin:    General: Skin is warm and dry.     Findings: No erythema or rash.  Neurological:     Mental Status: She is alert and oriented to person, place, and time.     Cranial Nerves: No cranial nerve deficit.     Motor: No abnormal muscle tone.     Coordination: Coordination normal.  Psychiatric:        Mood and Affect: Affect normal.         CMP Latest Ref Rng & Units 07/22/2020  Glucose 70 - 99 mg/dL 107(H)  BUN 6 - 20 mg/dL 17  Creatinine 0.44 - 1.00 mg/dL 0.75  Sodium 135 - 145 mmol/L 139  Potassium 3.5 - 5.1 mmol/L 3.8  Chloride 98 - 111 mmol/L 105  CO2 22 - 32 mmol/L 25  Calcium 8.9 - 10.3 mg/dL 9.2  Total Protein 6.5 - 8.1 g/dL 7.9  Total Bilirubin 0.3 - 1.2 mg/dL 0.5  Alkaline Phos 38 - 126 U/L 90  AST 15 - 41 U/L 25  ALT 0 - 44 U/L 22   CBC Latest Ref Rng & Units 07/29/2020  WBC 4.0 - 10.5 K/uL 8.5  Hemoglobin 12.0 - 15.0 g/dL 12.1  Hematocrit 36 - 46 % 36.9  Platelets 150 - 400 K/uL 315   RADIOGRAPHIC STUDIES: I have personally reviewed the radiological images as listed and agreed with the findings in the report. 08/23/2018 DEXA The BMD measured at Femur Neck Right is 0.900 g/cm2 with a T-score of -1.0. This patient is considered normal according to [facility]) criteria. Site Region Measured Measured WHO Young Adult BMD Date       Age      Classification T-score AP Spine L1-L4 08/23/2018 35.5 Normal -0.8 1.091 g/cm2 DualFemur Neck Right 08/23/2018 35.5 Normal -1.0 0.900 g/cm2  08/15/2018 2D echo Study Conclusions - Left ventricle: The cavity size was mildly dilated. Systolic   function was mildly reduced. The estimated ejection fraction was   45%. Diffuse hypokinesis. Hypokinesis of the anteroseptal   myocardium. Doppler parameters are consistent with abnormal left   ventricular relaxation (grade 1 diastolic dysfunction). - Aortic valve: Valve area (VTI): 1.88 cm^2. Valve  area (Vmax): 1.8   cm^2. Valve area (Vmean): 1.74 cm^2. - Mitral valve: There was mild regurgitation. Valve area by   continuity equation (using LVOT flow): 2.22 cm^2. - Left atrium: The atrium was mildly dilated. - Right ventricle: The cavity size was moderately dilated. - Right atrium: The atrium was mildly dilated.  RADIOGRAPHIC STUDIES: I have personally reviewed the radiological images as listed and agreed with the findings in the report. 10/30/2018 Bone scan whole body Non scintigraphic evidence of osseous metastatic disease.  Assessment and plan- Patient is a 39 y.o. female presents for evaluation of newly diagnosed multicentric right breast cancer.  Cancer Staging Breast cancer of upper-outer quadrant of right female breast [facility]) Staging form: Breast, AJCC 8th Edition - Clinical: G2, ER+, PR+, HER2- - Signed by Earlie Server, MD on 04/05/2018 - Pathologic stage from 04/04/2018: No Stage Recommended (ypT3, pN2, cM0, G3, ER+, PR-, HER2-) - Signed by Earlie Server, MD on 04/04/2018    1. Malignant neoplasm of upper-outer quadrant of right breast in female, estrogen receptor positive ([place])   2. Goals of care, counseling/discussion   3. Neuropathy due to chemotherapeutic drug ([place])   4. HER2-positive carcinoma of breast ([place])   5. Neoplasm related pain   Cancer Staging Breast cancer of upper-outer quadrant of right female breast [facility]) Staging form: Breast, AJCC 8th Edition - Clinical: G2, ER+, PR+, HER2- - Signed by Earlie Server, MD on 04/05/2018 - Pathologic stage from 04/04/2018: No Stage Recommended (ypT3, pN2, cM0, G3, ER+, PR-, HER2-) - Signed by Earlie Server, MD on 04/04/2018   #Initially stage IIIA right breast cancer, ER positive, HER-2 negative status post bilateral oophorectomy, Right mastectomy and right axillary lymph node dissection, status post implant, and implant removal.  Status post left mastectomy and axillary sentinel lymph node biopsy. Now she has developed stage IV disease with  biopsy-proven thoracic spine bone metastasis. Estrogen positive, HER-2 positive breast cancer. Pathology report was reviewed and discussed with the patient. PET scan images was reviewed and discussed with her.  This is an isolated metastasis.  I have discussed with [facility] neurosurgery Dr. Lacinda Axon will does not recommend surgical intervention at this point.  I referred patient to see radiation oncology Dr. Baruch Gouty for plan of palliative radiation.  Plan to give her HER-2 directed targeted therapy after radiation.  Also switch to Faslodex.  #Adjuvant bisphosphonate: Patient has been on Zometa every 6 months.  Will increase to Zometa monthly given that she has developed metastatic bone disease.  #Chemotherapy-induced neuropathy, continue Lyrica and nortriptyline.  Symptoms are stable.  Follow-up with neurology.. . #Depression.  Continue Lexapro 20 mg daily. #Mediport has been removed.  Follow-up to be determined Earlie Server, MD, PhD Hematology Oncology [place] [facility]  08/11/20

## 2020-08-11 NOTE — Progress Notes (Signed)
ON PATHWAY REGIMEN - Breast  No Change  Continue With Treatment as Ordered.  Original Decision Date/Time: 10/30/2017 09:02   Dose-Dense AC q14 days:   A cycle is every 14 days:     Doxorubicin      Cyclophosphamide      [id number]   **Always confirm dose/schedule in your pharmacy ordering system**  Paclitaxel 80 mg/m2 Weekly:   Administer weekly:     Paclitaxel   **Always confirm dose/schedule in your pharmacy ordering system**  Patient Characteristics: Preoperative or Nonsurgical Candidate (Clinical Staging), Neoadjuvant Therapy followed by Surgery, Invasive Disease, Chemotherapy, HER2 Negative/Unknown/Equivocal, ER Positive Therapeutic Status: Preoperative or Nonsurgical Candidate (Clinical Staging) AJCC M Category: cM0 AJCC Grade: G2 Breast Surgical Plan: Neoadjuvant Therapy followed by Surgery ER Status: Positive (+) AJCC 8 Stage Grouping: IB HER2 Status: Negative (-) AJCC T Category: cT2 AJCC N Category: cN0 PR Status: Positive (+) Intent of Therapy: Curative Intent, Discussed with Patient

## 2020-08-11 NOTE — Progress Notes (Signed)
Radiation Oncology Follow up Note (OP N/A) bone metastasis  Name: Theresa Chaney   Date:   08/11/2020 MRN:  [id number] DOB: [birth year]    This 39 y.o. female presents to the clinic today for T7 metastatic disease in patient previously treated for stage IIIa ER positive invasive mammary carcinoma of the right breast status post right modified radical mastectomy.  REFERRING PROVIDER: Marguerita Merles, MD  HPI: Patient is a 39 year old female treated back in 2019 for locally advanced stage IIIa ER positive invasive mammary carcinoma of the right breast status post right modified radical radical mastectomy she received right chest wall and peripheral emphatic radiation.  She had been doing well although the past 4 months has been complaining of mid back pain..  Work-up including PET scan and MRI scan showed lesion in the posterior aspect of T7 vertebral body she underwent a CT-guided biopsy positive for adenocarcinoma consistent with breast origin.  She is seen today for consideration of palliative radiation therapy.  She continues to have narcotic dependent pain in her back.  She is having no focal neurologic deficits.  COMPLICATIONS OF TREATMENT: none  FOLLOW UP COMPLIANCE: keeps appointments   PHYSICAL EXAM:  BP 110/80 (BP Location: Left Arm, Patient Position: Sitting, Cuff Size: Normal)   Pulse 100   Temp (!) 97 F (36.1 C) (Tympanic)   Wt 189 lb (85.7 kg)   LMP  (LMP Unknown) Comment: Ovaries and tubes removed.April 2019  BMI 29.60 kg/m  There is some focal pain on deep palpation of her thoracic spine.  No focal neurologic deficits are appreciated motor or sensory and DTR levels are equal and symmetric in upper or lower extremities.  Well-developed well-nourished patient in NAD. HEENT reveals PERLA, EOMI, discs not visualized.  Oral cavity is clear. No oral mucosal lesions are identified. Neck is clear without evidence of cervical or supraclavicular adenopathy. Lungs are clear to A&P. Cardiac  examination is essentially unremarkable with regular rate and rhythm without murmur rub or thrill. Abdomen is benign with no organomegaly or masses noted. Motor sensory and DTR levels are equal and symmetric in the upper and lower extremities. Cranial nerves II through XII are grossly intact. Proprioception is intact. No peripheral adenopathy or edema is identified. No motor or sensory levels are noted. Crude visual fields are within normal range.  RADIOLOGY RESULTS: PET CT and MRI scans reviewed compatible with above-stated findings  PLAN: At this time elected with palliative radiation therapy to her T7 vertebral body.  We will plan on delivering 3000 cGy in 10 fractions.  Risks and benefits of treatment including possible dysphagia from radiation esophagitis fatigue alteration blood count skin reaction all were described in detail to the patient.  I have personally set up and ordered CT simulation for tomorrow.  Patient comprehends my recommendations well.  I would like to take this opportunity to thank you for allowing me to participate in the care of your patient.Noreene Filbert, MD

## 2020-08-11 NOTE — Progress Notes (Signed)
Pt here as an addon with questions and what her plan will be regarding her cancer.

## 2020-08-13 ENCOUNTER — Other Ambulatory Visit: Payer: Self-pay | Admitting: Oncology

## 2020-08-13 ENCOUNTER — Ambulatory Visit
Admission: RE | Admit: 2020-08-13 | Discharge: 2020-08-13 | Disposition: A | Discharge status: Home / Self Care | Payer: Medicare HMO | Source: Ambulatory Visit | Attending: Radiation Oncology | Admitting: Radiation Oncology

## 2020-08-13 ENCOUNTER — Encounter: Payer: Self-pay | Admitting: Oncology

## 2020-08-13 ENCOUNTER — Other Ambulatory Visit: Payer: Self-pay | Admitting: *Deleted

## 2020-08-13 DIAGNOSIS — Z510 Encounter for antineoplastic radiation therapy: Secondary | ICD-10-CM | POA: Insufficient documentation

## 2020-08-13 DIAGNOSIS — C7951 Secondary malignant neoplasm of bone: Secondary | ICD-10-CM | POA: Diagnosis present

## 2020-08-13 DIAGNOSIS — C50919 Malignant neoplasm of unspecified site of unspecified female breast: Secondary | ICD-10-CM

## 2020-08-14 ENCOUNTER — Telehealth: Payer: Self-pay | Admitting: Oncology

## 2020-08-14 ENCOUNTER — Other Ambulatory Visit: Payer: Self-pay | Admitting: Oncology

## 2020-08-14 DIAGNOSIS — Z510 Encounter for antineoplastic radiation therapy: Secondary | ICD-10-CM | POA: Diagnosis not present

## 2020-08-14 MED ORDER — XTAMPZA ER 13.5 MG PO C12A: 13.5000 mg | EXTENDED_RELEASE_CAPSULE | Freq: Two times a day (BID) | ORAL | 0 refills | Status: DC

## 2020-08-14 NOTE — Telephone Encounter (Signed)
I called patient regarding request of diazepam at night.  Patient is already taking Ativan.  I will not be able to give her another diazepam prescription.  She feels that insomnia is secondary to pain not fully relieved.  I made a suggestion of increasing fentanyl patch to improve pain control.  Patient reports that fentanyl patch is causing her to have a headache and she does not want further increase her fentanyl patch.  She is already taking oxycodone 10 mg every 6 hours and pain is not very controlled.  Recommend to switch to [facility] ER 13.5 mg every 12 hours and can further titrate long-acting dose for pain control level.  She agrees with the plan.

## 2020-08-18 ENCOUNTER — Ambulatory Visit
Admission: RE | Admit: 2020-08-18 | Discharge: 2020-08-18 | Disposition: A | Discharge status: Home / Self Care | Payer: Medicare HMO | Source: Ambulatory Visit | Attending: Radiation Oncology | Admitting: Radiation Oncology

## 2020-08-18 DIAGNOSIS — Z510 Encounter for antineoplastic radiation therapy: Secondary | ICD-10-CM | POA: Diagnosis not present

## 2020-08-19 ENCOUNTER — Ambulatory Visit
Admission: RE | Admit: 2020-08-19 | Discharge: 2020-08-19 | Disposition: A | Discharge status: Home / Self Care | Payer: Medicare HMO | Source: Ambulatory Visit | Attending: Radiation Oncology | Admitting: Radiation Oncology

## 2020-08-19 ENCOUNTER — Other Ambulatory Visit: Payer: Self-pay | Admitting: Oncology

## 2020-08-19 DIAGNOSIS — Z510 Encounter for antineoplastic radiation therapy: Secondary | ICD-10-CM | POA: Diagnosis not present

## 2020-08-20 ENCOUNTER — Encounter: Payer: Self-pay | Admitting: Oncology

## 2020-08-20 ENCOUNTER — Ambulatory Visit
Admission: RE | Admit: 2020-08-20 | Discharge: 2020-08-20 | Disposition: A | Discharge status: Home / Self Care | Payer: Medicare HMO | Source: Ambulatory Visit | Attending: Radiation Oncology | Admitting: Radiation Oncology

## 2020-08-20 DIAGNOSIS — Z510 Encounter for antineoplastic radiation therapy: Secondary | ICD-10-CM | POA: Diagnosis not present

## 2020-08-20 NOTE — Telephone Encounter (Signed)
MyChart message sent to patient.

## 2020-08-20 NOTE — Telephone Encounter (Signed)
Please follow up with her about her pain control. See my last phone note.

## 2020-08-21 ENCOUNTER — Ambulatory Visit
Admission: RE | Admit: 2020-08-21 | Discharge: 2020-08-21 | Disposition: A | Discharge status: Home / Self Care | Payer: Medicare HMO | Source: Ambulatory Visit | Attending: Radiation Oncology | Admitting: Radiation Oncology

## 2020-08-21 ENCOUNTER — Encounter: Payer: Self-pay | Admitting: Oncology

## 2020-08-21 DIAGNOSIS — Z510 Encounter for antineoplastic radiation therapy: Secondary | ICD-10-CM | POA: Diagnosis not present

## 2020-08-21 MED ORDER — DEXAMETHASONE 4 MG PO TABS: 4.0000 mg | ORAL_TABLET | Freq: Every day | ORAL | 0 refills | Status: DC

## 2020-08-21 NOTE — Telephone Encounter (Addendum)
Patient has not started new pain med yet due to pharmacy needing to order.  Do you want to refill the Dexamethasone?

## 2020-08-24 ENCOUNTER — Ambulatory Visit
Admission: RE | Admit: 2020-08-24 | Discharge: 2020-08-24 | Disposition: A | Discharge status: Home / Self Care | Payer: Medicare HMO | Source: Ambulatory Visit | Attending: Radiation Oncology | Admitting: Radiation Oncology

## 2020-08-24 DIAGNOSIS — Z510 Encounter for antineoplastic radiation therapy: Secondary | ICD-10-CM | POA: Diagnosis not present

## 2020-08-25 ENCOUNTER — Other Ambulatory Visit: Payer: Self-pay

## 2020-08-25 ENCOUNTER — Ambulatory Visit
Admission: RE | Admit: 2020-08-25 | Discharge: 2020-08-25 | Disposition: A | Discharge status: Home / Self Care | Payer: Medicare HMO | Source: Ambulatory Visit | Attending: Radiation Oncology | Admitting: Radiation Oncology

## 2020-08-25 ENCOUNTER — Inpatient Hospital Stay: Payer: Medicare HMO | Attending: Oncology

## 2020-08-25 DIAGNOSIS — Z8052 Family history of malignant neoplasm of bladder: Secondary | ICD-10-CM | POA: Diagnosis not present

## 2020-08-25 DIAGNOSIS — Z9013 Acquired absence of bilateral breasts and nipples: Secondary | ICD-10-CM | POA: Diagnosis not present

## 2020-08-25 DIAGNOSIS — F17210 Nicotine dependence, cigarettes, uncomplicated: Secondary | ICD-10-CM | POA: Diagnosis not present

## 2020-08-25 DIAGNOSIS — Z90722 Acquired absence of ovaries, bilateral: Secondary | ICD-10-CM | POA: Diagnosis not present

## 2020-08-25 DIAGNOSIS — Z79899 Other long term (current) drug therapy: Secondary | ICD-10-CM | POA: Diagnosis not present

## 2020-08-25 DIAGNOSIS — K208 Other esophagitis without bleeding: Secondary | ICD-10-CM | POA: Insufficient documentation

## 2020-08-25 DIAGNOSIS — F329 Major depressive disorder, single episode, unspecified: Secondary | ICD-10-CM | POA: Diagnosis not present

## 2020-08-25 DIAGNOSIS — G620 Drug-induced polyneuropathy: Secondary | ICD-10-CM | POA: Diagnosis not present

## 2020-08-25 DIAGNOSIS — T451X5A Adverse effect of antineoplastic and immunosuppressive drugs, initial encounter: Secondary | ICD-10-CM | POA: Insufficient documentation

## 2020-08-25 DIAGNOSIS — Z9221 Personal history of antineoplastic chemotherapy: Secondary | ICD-10-CM | POA: Insufficient documentation

## 2020-08-25 DIAGNOSIS — C50919 Malignant neoplasm of unspecified site of unspecified female breast: Secondary | ICD-10-CM

## 2020-08-25 DIAGNOSIS — C7951 Secondary malignant neoplasm of bone: Secondary | ICD-10-CM | POA: Diagnosis not present

## 2020-08-25 DIAGNOSIS — G893 Neoplasm related pain (acute) (chronic): Secondary | ICD-10-CM | POA: Insufficient documentation

## 2020-08-25 DIAGNOSIS — Z808 Family history of malignant neoplasm of other organs or systems: Secondary | ICD-10-CM | POA: Insufficient documentation

## 2020-08-25 DIAGNOSIS — Z170 Estrogen receptor positive status [ER+]: Secondary | ICD-10-CM | POA: Diagnosis not present

## 2020-08-25 DIAGNOSIS — Z510 Encounter for antineoplastic radiation therapy: Secondary | ICD-10-CM | POA: Diagnosis not present

## 2020-08-25 DIAGNOSIS — Z8049 Family history of malignant neoplasm of other genital organs: Secondary | ICD-10-CM | POA: Diagnosis not present

## 2020-08-25 DIAGNOSIS — C50411 Malignant neoplasm of upper-outer quadrant of right female breast: Secondary | ICD-10-CM | POA: Diagnosis not present

## 2020-08-25 LAB — CBC
HCT: 36 % (ref 36.0–46.0)
Hemoglobin: 12.2 g/dL (ref 12.0–15.0)
MCH: 30.2 pg (ref 26.0–34.0)
MCHC: 33.9 g/dL (ref 30.0–36.0)
MCV: 89.1 fL (ref 80.0–100.0)
Platelets: 317 10*3/uL (ref 150–400)
RBC: 4.04 MIL/uL (ref 3.87–5.11)
RDW: 13.5 % (ref 11.5–15.5)
WBC: 9.2 10*3/uL (ref 4.0–10.5)
nRBC: 0 % (ref 0.0–0.2)

## 2020-08-26 ENCOUNTER — Ambulatory Visit
Admission: RE | Admit: 2020-08-26 | Discharge: 2020-08-26 | Disposition: A | Discharge status: Home / Self Care | Payer: Medicare HMO | Source: Ambulatory Visit | Attending: Radiation Oncology | Admitting: Radiation Oncology

## 2020-08-26 DIAGNOSIS — Z510 Encounter for antineoplastic radiation therapy: Secondary | ICD-10-CM | POA: Diagnosis not present

## 2020-08-27 ENCOUNTER — Ambulatory Visit
Admission: RE | Admit: 2020-08-27 | Discharge: 2020-08-27 | Disposition: A | Discharge status: Home / Self Care | Payer: Medicare HMO | Source: Ambulatory Visit | Attending: Radiation Oncology | Admitting: Radiation Oncology

## 2020-08-27 DIAGNOSIS — Z510 Encounter for antineoplastic radiation therapy: Secondary | ICD-10-CM | POA: Diagnosis not present

## 2020-08-28 ENCOUNTER — Ambulatory Visit
Admission: RE | Admit: 2020-08-28 | Discharge: 2020-08-28 | Disposition: A | Discharge status: Home / Self Care | Payer: Medicare HMO | Source: Ambulatory Visit | Attending: Radiation Oncology | Admitting: Radiation Oncology

## 2020-08-28 DIAGNOSIS — Z510 Encounter for antineoplastic radiation therapy: Secondary | ICD-10-CM | POA: Diagnosis not present

## 2020-08-30 ENCOUNTER — Other Ambulatory Visit: Payer: Self-pay

## 2020-08-30 ENCOUNTER — Ambulatory Visit
Admission: RE | Admit: 2020-08-30 | Discharge: 2020-08-30 | Disposition: A | Discharge status: Home / Self Care | Payer: Medicare HMO | Source: Ambulatory Visit | Attending: Oncology | Admitting: Oncology

## 2020-08-30 DIAGNOSIS — Z170 Estrogen receptor positive status [ER+]: Secondary | ICD-10-CM | POA: Insufficient documentation

## 2020-08-30 DIAGNOSIS — C50411 Malignant neoplasm of upper-outer quadrant of right female breast: Secondary | ICD-10-CM | POA: Diagnosis present

## 2020-08-30 IMAGING — MR MR HEAD WO/W CM
10 series · 48 of 48 positions shown · IV contrast (gadavist)
Comparison: None.

CLINICAL DATA: Metastatic breast cancer, staging

EXAM:
MRI HEAD WITHOUT AND WITH CONTRAST
TECHNIQUE: Multiplanar, multiecho pulse sequences of the brain and surrounding
structures were obtained without and with intravenous contrast.
CONTRAST:  9mL GADAVIST GADOBUTROL 1 MMOL/ML IV SOLN

[Series 5: ax dwi_tracew · axial · 3.0mm · 0.60mm/px · z∈[-61,+94]mm · 6 of 96 slices shown]
[im 1/96]
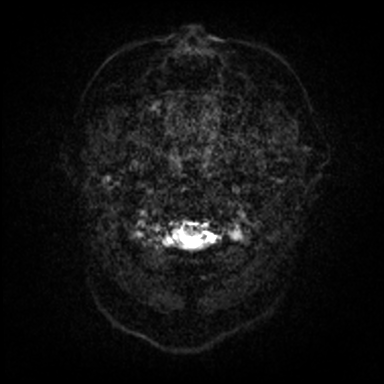
[im 20/96]
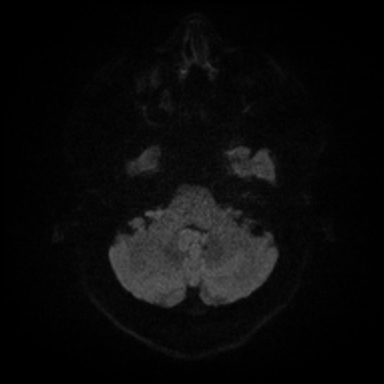
[im 39/96]
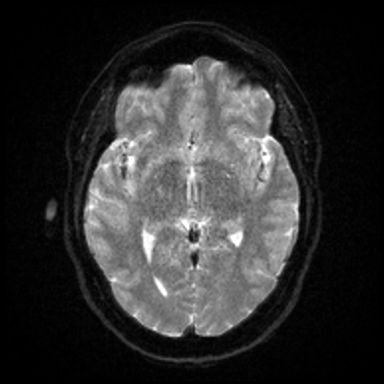
[im 58/96]
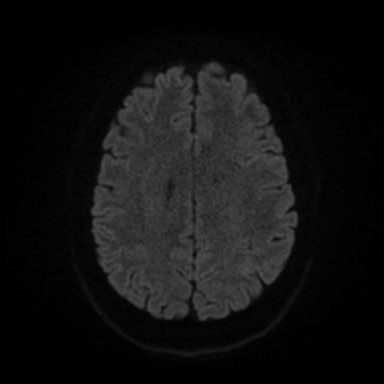
[im 77/96]
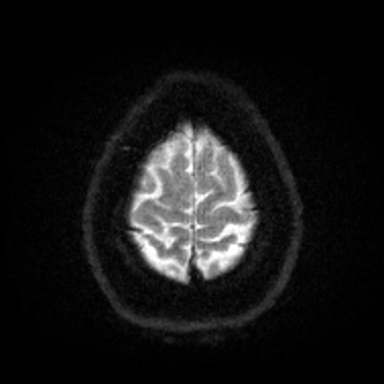
[im 96/96]
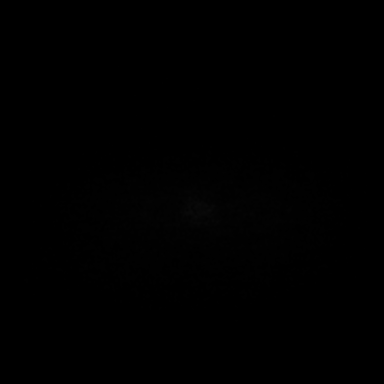

[Series 8: cor dwi_adc · coronal · 5.0mm · 0.60mm/px · 2 of 38 slices shown]
[im 1/38]
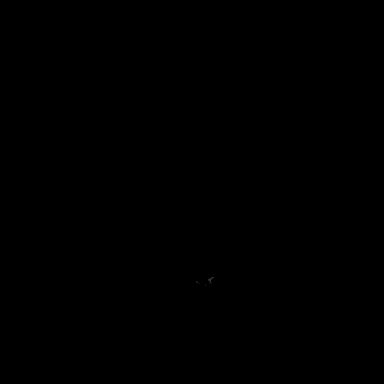
[im 38/38]
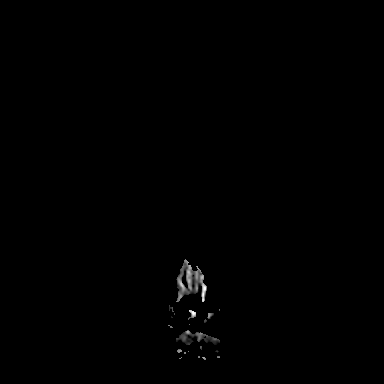

[Series 9: T1 · sagittal · 5.0mm · 0.62mm/px · 2 of 24 slices shown (1 of 2)]
[im 1/24]
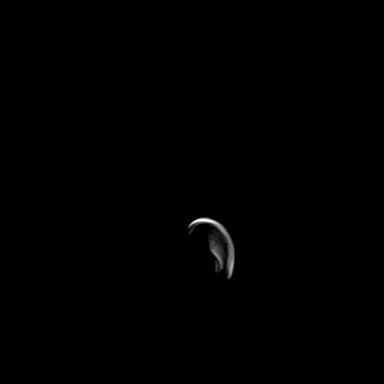
[im 24/24]
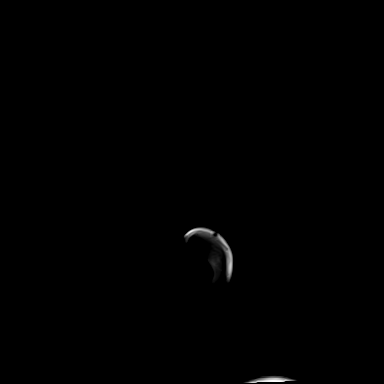

[Series 10: T2 · axial · 5.0mm · 0.53mm/px · z∈[-56,+88]mm · 2 of 25 slices shown]
[im 1/25]
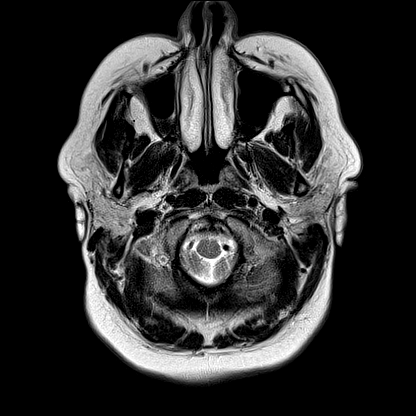
[im 25/25]
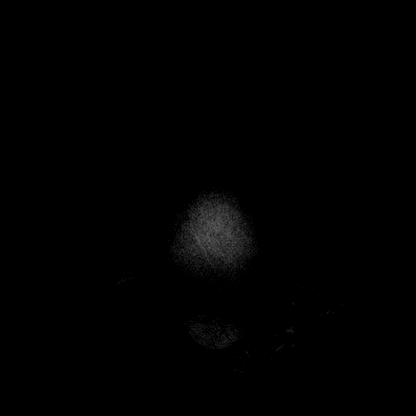

[Series 12: pha_images · axial · 3.0mm · 0.90mm/px · z∈[-72,+105]mm · 4 of 59 slices shown]
[im 1/59]
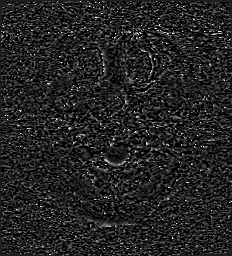
[im 20/59]
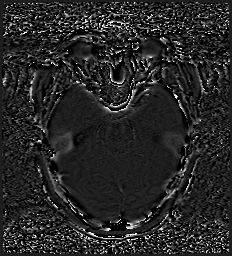
[im 39/59]
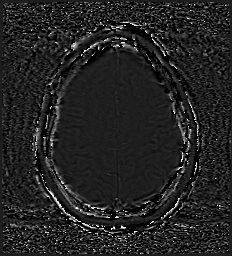
[im 59/59]
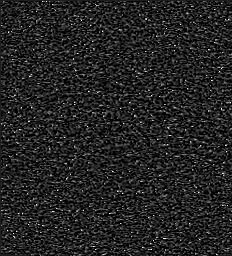

[Series 15: FLAIR · axial · 3.0mm · 0.53mm/px · z∈[-65,+97]mm · 4 of 55 slices shown]
[im 1/55]
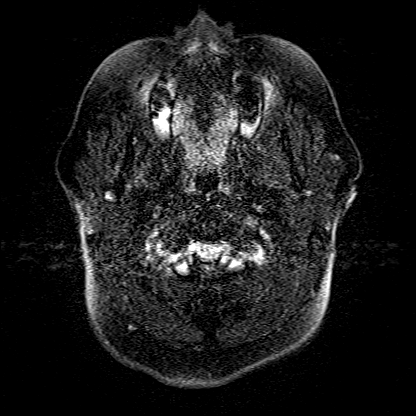
[im 19/55]
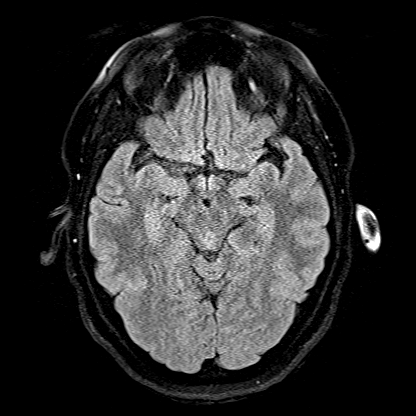
[im 37/55]
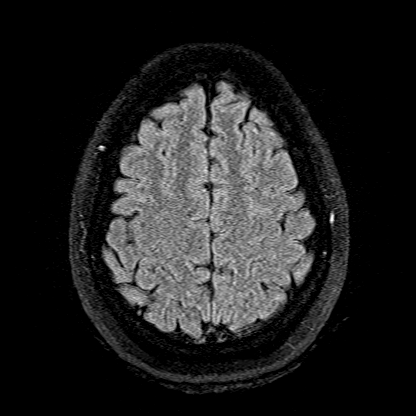
[im 55/55]
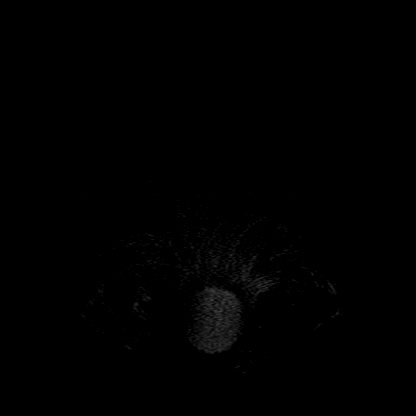

[Series 16: T1 · axial · 1.0mm · 0.98mm/px · z∈[-71,+104]mm · 12 of 176 slices shown (2 of 2)]
[im 1/176]
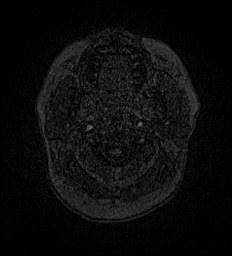
[im 16/176]
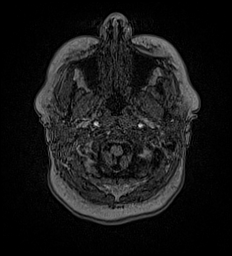
[im 32/176]
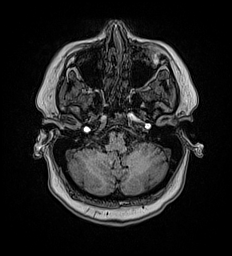
[im 48/176]
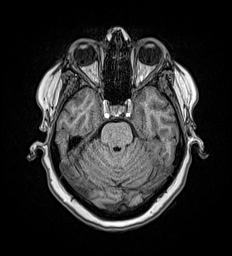
[im 64/176]
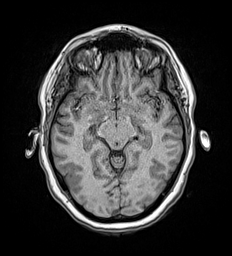
[im 80/176]
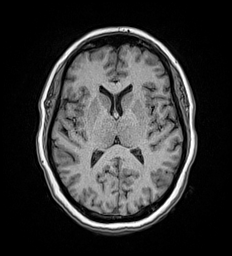
[im 96/176]
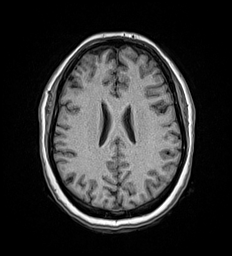
[im 112/176]
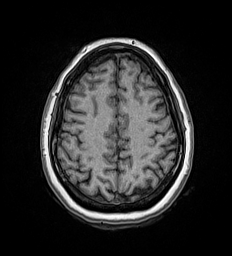
[im 128/176]
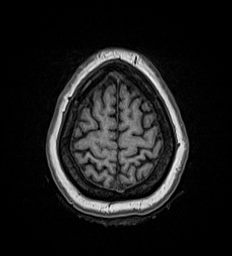
[im 144/176]
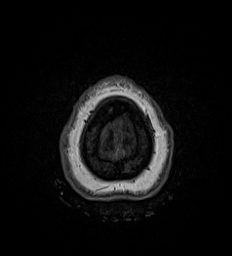
[im 160/176]
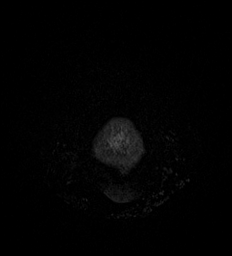
[im 176/176]
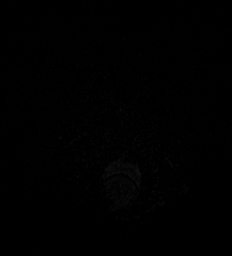

[Series 17: T2 post-contrast · coronal · 5.0mm · 0.57mm/px · 2 of 29 slices shown]
[im 1/29]
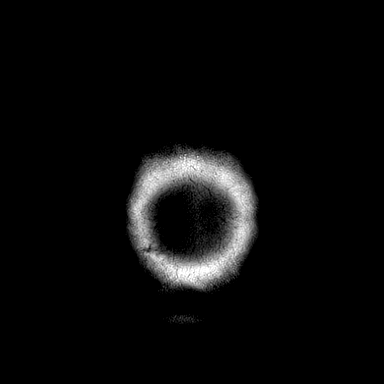
[im 29/29]
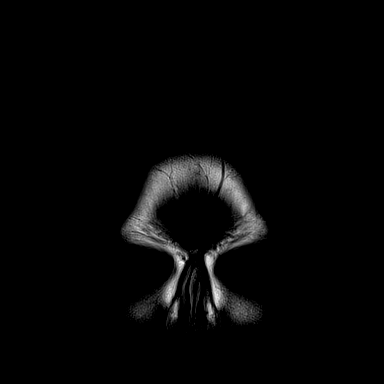

[Series 18: T1 post-contrast · axial · 1.0mm · 0.98mm/px · z∈[-71,+104]mm · 12 of 176 slices shown (1 of 2)]
[im 1/176]
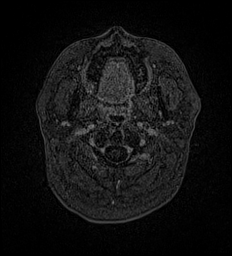
[im 16/176]
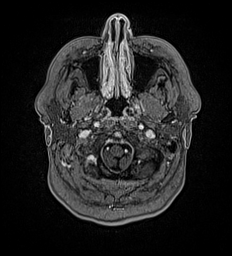
[im 32/176]
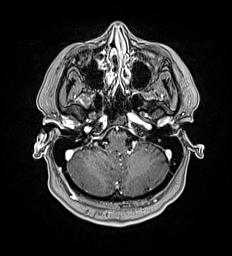
[im 48/176]
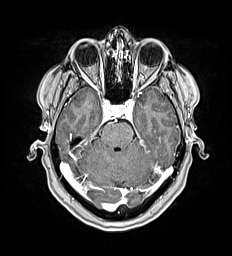
[im 64/176]
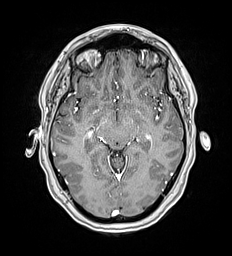
[im 80/176]
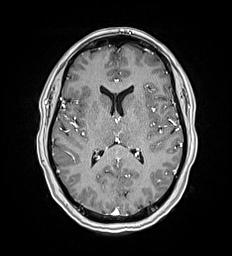
[im 96/176]
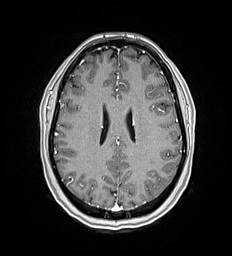
[im 112/176]
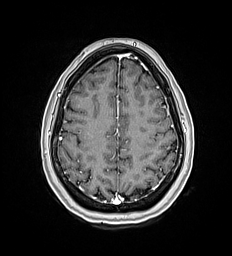
[im 128/176]
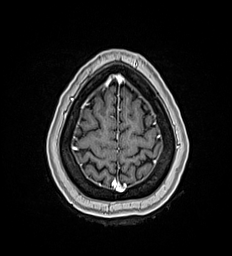
[im 144/176]
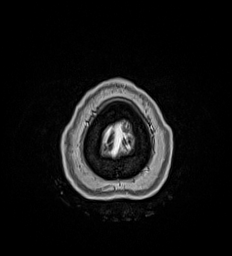
[im 160/176]
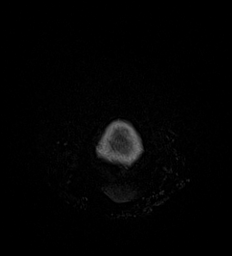
[im 176/176]
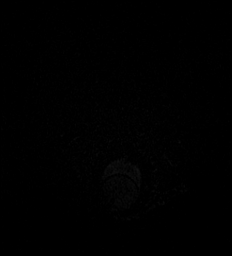

[Series 20: T1 post-contrast · sagittal · 5.0mm · 0.62mm/px · 2 of 25 slices shown (2 of 2)]
[im 1/25]
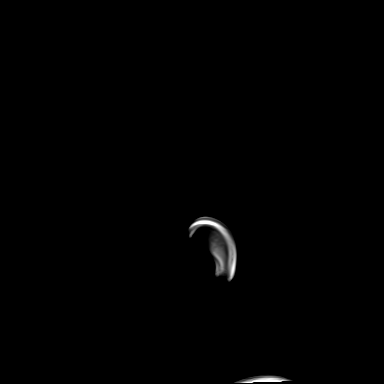
[im 25/25]
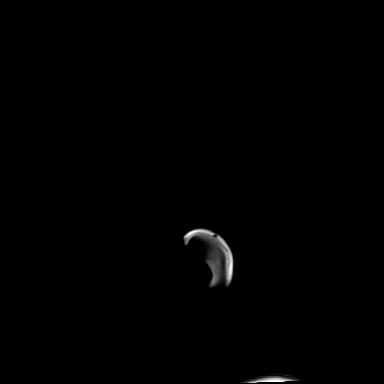

[48 of 48 positions shown; findings below may reference images not displayed]

FINDINGS: Brain: No acute infarction, hemorrhage, hydrocephalus, extra-axial
collection or mass lesion. Normal white matter

Normal enhancement.  Negative for metastatic disease.

Vascular: Normal arterial flow voids.  Normal venous enhancement.

Skull and upper cervical spine: Negative

Sinuses/Orbits: Paranasal sinuses clear.  Normal orbit

Other: None
IMPRESSION: Normal MRI head with contrast. Negative for metastatic disease to
the brain.

## 2020-08-30 MED ORDER — GADOBUTROL 1 MMOL/ML IV SOLN
9.0000 mL | Freq: Once | INTRAVENOUS | Status: AC | PRN
  Administered 2020-08-30: 9 mL via INTRAVENOUS

## 2020-08-31 ENCOUNTER — Inpatient Hospital Stay: Payer: Medicare HMO | Admitting: Oncology

## 2020-08-31 ENCOUNTER — Ambulatory Visit
Admission: RE | Admit: 2020-08-31 | Discharge: 2020-08-31 | Disposition: A | Discharge status: Home / Self Care | Payer: Medicare HMO | Source: Ambulatory Visit | Attending: Radiation Oncology | Admitting: Radiation Oncology

## 2020-08-31 ENCOUNTER — Encounter: Payer: Self-pay | Admitting: Oncology

## 2020-08-31 ENCOUNTER — Other Ambulatory Visit: Payer: Self-pay | Admitting: *Deleted

## 2020-08-31 ENCOUNTER — Other Ambulatory Visit: Payer: Self-pay | Admitting: Oncology

## 2020-08-31 VITALS — BP 110/66 | HR 110 | Temp 97.4°F | Resp 18 | Wt 190.7 lb

## 2020-08-31 DIAGNOSIS — K208 Other esophagitis without bleeding: Secondary | ICD-10-CM

## 2020-08-31 DIAGNOSIS — C50919 Malignant neoplasm of unspecified site of unspecified female breast: Secondary | ICD-10-CM

## 2020-08-31 DIAGNOSIS — Z7189 Other specified counseling: Secondary | ICD-10-CM

## 2020-08-31 DIAGNOSIS — C50411 Malignant neoplasm of upper-outer quadrant of right female breast: Secondary | ICD-10-CM | POA: Diagnosis not present

## 2020-08-31 DIAGNOSIS — G893 Neoplasm related pain (acute) (chronic): Secondary | ICD-10-CM

## 2020-08-31 DIAGNOSIS — Z170 Estrogen receptor positive status [ER+]: Secondary | ICD-10-CM

## 2020-08-31 DIAGNOSIS — T66XXXA Radiation sickness, unspecified, initial encounter: Secondary | ICD-10-CM

## 2020-08-31 DIAGNOSIS — C50811 Malignant neoplasm of overlapping sites of right female breast: Secondary | ICD-10-CM

## 2020-08-31 DIAGNOSIS — Z510 Encounter for antineoplastic radiation therapy: Secondary | ICD-10-CM | POA: Diagnosis not present

## 2020-08-31 MED ORDER — SUCRALFATE 1 G PO TABS: 1.0000 g | ORAL_TABLET | Freq: Three times a day (TID) | ORAL | 1 refills | Status: DC

## 2020-08-31 MED ORDER — MAGIC MOUTHWASH: 5.0000 mL | Freq: Four times a day (QID) | ORAL | 1 refills | Status: DC

## 2020-08-31 MED ORDER — DEXAMETHASONE 4 MG PO TABS
4.0000 mg | ORAL_TABLET | Freq: Every day | ORAL | 0 refills | Status: DC
Start: 2020-08-31 — End: 2020-10-02

## 2020-08-31 NOTE — Progress Notes (Signed)
DISCONTINUE ON PATHWAY REGIMEN - Breast   Dose-Dense AC q14 days:   A cycle is every 14 days:     Doxorubicin      Cyclophosphamide      Pegfilgrastim-xxxx   **Always confirm dose/schedule in your pharmacy ordering system**  Paclitaxel 80 mg/m2 Weekly:   Administer weekly:     Paclitaxel   **Always confirm dose/schedule in your pharmacy ordering system**  REASON: Disease Progression PRIOR TREATMENT: [id number]: Dose-Dense AC-T (Paclitaxel Weekly) - [Doxorubicin + Cyclophosphamide q14 Days x 4 Cycles, Followed by Paclitaxel 80 mg/m2 Weekly x 12 Weeks] TREATMENT RESPONSE: Progressive Disease (PD)  START ON PATHWAY REGIMEN - Breast     A cycle is every 21 days:     Pertuzumab      Pertuzumab      Trastuzumab-xxxx      Trastuzumab-xxxx   **Always confirm dose/schedule in your pharmacy ordering system**  Patient Characteristics: Distant Metastases or Locoregional Recurrent Disease - Unresected or Locally Advanced Unresectable Disease Progressing after Neoadjuvant and Local Therapies, HER2 Positive, ER Positive, HER2-Targeted Therapy (Concurrent with Endocrine Therapy) Therapeutic Status: Distant Metastases ER Status: Positive (+) HER2 Status: Positive (+) PR Status: Negative (-) Intent of Therapy: Non-Curative / Palliative Intent, Not Discussed with Patient 

## 2020-08-31 NOTE — Progress Notes (Signed)
Patient here for oncology follow-up appointment, expresses complaints of extreme breast pain from radiation especially with eating.

## 2020-08-31 NOTE — Progress Notes (Signed)
ON PATHWAY REGIMEN - Breast  No Change  Continue With Treatment as Ordered.  Original Decision Date/Time: 08/11/2020 17:16   Dose-Dense AC q14 days:   A cycle is every 14 days:     Doxorubicin      Cyclophosphamide      Pegfilgrastim-xxxx   **Always confirm dose/schedule in your pharmacy ordering system**  Paclitaxel 80 mg/m2 Weekly:   Administer weekly:     Paclitaxel   **Always confirm dose/schedule in your pharmacy ordering system**  Patient Characteristics: Preoperative or Nonsurgical Candidate (Clinical Staging), Neoadjuvant Therapy followed by Surgery, Invasive Disease, Chemotherapy, HER2 Negative/Unknown/Equivocal, ER Positive Therapeutic Status: Preoperative or Nonsurgical Candidate (Clinical Staging) AJCC M Category: cM0 AJCC Grade: G2 Breast Surgical Plan: Neoadjuvant Therapy followed by Surgery ER Status: Positive (+) AJCC 8 Stage Grouping: IB HER2 Status: Negative (-) AJCC T Category: cT2 AJCC N Category: cN0 PR Status: Positive (+) Intent of Therapy: Curative Intent, Discussed with Patient

## 2020-08-31 NOTE — Progress Notes (Signed)
Hematology/Oncology Follow Up Note [place] [phone] [phone]  Patient Care Team: Marguerita Merles, MD as PCP - General (Family Medicine) End, Harrell Gave, MD as PCP - Cardiology (Cardiology) Rico Junker, RN as Oncology Nurse Navigator Earlie Server, MD as Consulting Physician (Oncology) Bary Castilla, Forest Gleason, MD (General Surgery) Noreene Filbert, MD as Referring Physician (Radiation Oncology) Noreene Filbert, MD as Radiation Oncologist (Radiation Oncology) Noreene Filbert, MD as Radiation Oncologist (Radiation Oncology)   Name of the patient: Theresa Chaney  [id number]  1981/05/24   Date of visit: 08/31/20 REASON FOR VISIT Follow up for Assessment prior to chemotherapy treatment of breast cancer  Oncology History Cancer TREATMENT Neoadjuvant ddAC +one dose of Taxol, due to lack of response, surgery was offered. Case was discussed on breast tumor board. 03/19/2018 S/p right mastectomy and right axillary dissection, immediate breast reconstruction with placement of expanders.  ypT3 ypN2, + lymphovascular invasion,  Grade 3, margin is negative, close. ER 90%, PR 0%, HER2 IHC negative.  Also had elective bilateral salpingo-oophorectomy..  # 06/11/2018 s/p 11 cycles Taxol adjuvantly. Tolerated well.  # She has obtained dental clearance for starting Zometa.  S/p Zometa on 6/3/ 2019 # s/p adjuvant right chest wall radiation, finished 10/10/2018 #06/03/2019 underwent elective left prophylactic mastectomy and sentinel lymph node biopsy of left axilla.  # 06/03/2019 underwent elective left prophylactic mastectomy and sentinel lymph node biopsy of left axilla. Pathology negative for malignancy. Status post Mediport removal on 06/03/2019. She also underwent right implant removal on 06/03/2019. She also underwent right implant removal on 06/03/2019. #Negative genetic testing  #  chemotherapy-induced neuropathy, bilateral fingertips and lower extremities.   Patient is on Lyrica and nortriptyline.  Follows up with neurology. She follows up with lymphedema clinic.  #  07/07/2020, MRI thoracic spine without contrast showed lesions involving the T7 posterior elements most concerning for metastatic lesion.  No evidence of epidural tumor.  Minimal thoracic spondylosis without stenosis. MRI was reviewed by me and a PET scan was obtained for further evaluation. 07/20/2020, PET scan showed hypermetabolic metastasis involving the posterior element of T7, no additional evidence of metastasis in the neck, chest, abdomen or pelvis.  # 07/29/2020 T7 lesion biopsy showed metastatic carcinoma, compatible with breast origin.  Receptor status staining showed ER 71-80% positive, PR negative, HER-2 positive IHC 3+  INTERVAL HISTORY 39 yo female with above oncology history reviewed by me presents for follow-up of management of metastatic breast cancer.  Patient finishes spine radiation today.  She reports having odynophagia, acid reflux symptoms.  She is on PPI, sucralfate, and a course of steroid. back pain is somewhat better, still needs pain medications.  She is currently on oxycodone 5 to 10 mg every 6 hours as needed plus Xtampza ER 13.5 mg twice daily.    Review of Systems  Constitutional: Negative for chills, fever, malaise/fatigue and weight loss.  HENT: Negative for sore throat.   Eyes: Negative for redness.  Respiratory: Negative for cough, shortness of breath and wheezing.   Cardiovascular: Negative for chest pain, palpitations and leg swelling.  Gastrointestinal: Positive for heartburn. Negative for abdominal pain, blood in stool, nausea and vomiting.       Odynophagia  Genitourinary: Negative for dysuria.  Musculoskeletal: Positive for back pain and joint pain. Negative for myalgias.  Skin: Negative for rash.  Neurological: Positive for tingling. Negative for dizziness and tremors.  Endo/Heme/Allergies: Does not bruise/bleed easily.    Psychiatric/Behavioral: Negative for hallucinations.    No Known Allergies  Patient Active Problem List   Diagnosis Date Noted  . Goals of care, counseling/discussion 08/11/2020  . Neuropathy due to chemotherapeutic drug ([place]) 08/11/2020  . HER2-positive carcinoma of breast ([place]) 08/11/2020  . Fracture of neck of metacarpal bone 05/14/2019  . Chronic fatigue 04/09/2019  . Polyarthralgia 04/09/2019  . Status post right breast reconstruction 02/26/2019  . Status post right mastectomy 02/26/2019  . Mastalgia 02/15/2019  . Shortness of breath 08/23/2018  . Nonischemic cardiomyopathy ([place]) 08/23/2018  . Preprocedural cardiovascular examination 08/23/2018  . Tachycardia 08/23/2018  . Palpitations 08/23/2018  . Estrogen receptor positive status (ER+) 04/04/2018  . Acquired absence of right breast and nipple 04/03/2018  . Breast cancer of upper-outer quadrant of right female breast ([place]) 03/19/2018  . Family history of cancer   . Malignant neoplasm of overlapping sites of right breast in female, estrogen receptor positive ([place]) 10/19/2017  . Gastroesophageal reflux disease without esophagitis 02/24/2017  . Generalized anxiety disorder 10/03/2014  . Headache 10/03/2014     Past Medical History:  Diagnosis Date  . Anemia   . Arthritis   . BRCA negative 11/26/2017  . Breast cancer ([place]) 10/11/2017   Multifocal, ER positive, PR negative, HER-2 negative. ypT3 ypN2a 8.7 cm, 4/15 nodes  . Cardiomyopathy ([place])    a. 10/2017 Echo: EF 60-65%, no rwma, Gr1 DD, nl RV size/fxn; b. 04/2018 Echo: EF 55-60%, no rwma, Nl RV size/fxn; c. 08/2018 Echo: EF 45%, diff HK, ? HK of antsept wall. Gr1 DD. Mild MR. Mild LAE/RAE. Mod dil RV.   [name] Chronic bronchitis ([place]) 11/2017  . COPD (chronic obstructive pulmonary disease) ([place])    MILD PER CXR  . Depression   . Family history of cancer   . GERD (gastroesophageal reflux disease)   . Headache    MIGRAINES  . Heart murmur    ASYMPTOMATIC  . Personal  history of chemotherapy    current for right breast ca     Past Surgical History:  Procedure Laterality Date  . AXILLARY LYMPH NODE DISSECTION Right 03/19/2018   Procedure: AXILLARY LYMPH NODE DISSECTION;  Surgeon: Robert Bellow, MD;  Location: [facility];  Service: General;  Laterality: Right;  . BREAST BIOPSY Right 10/11/2017   12:30 posterior coil clip invasive mammary carcinoma  . BREAST BIOPSY Right 10/11/2017   11:30 middle depth ribbon clip DCIS  . BREAST BIOPSY Right 10/11/2017   5:30 anterior depth x shape invasive ductal carcinoma  . BREAST IMPLANT REMOVAL Right 06/03/2019   Procedure: REMOVAL OF RIGHT BREAST IMPLANTS;  Surgeon: Wallace Going, DO;  Location: [facility];  Service: Plastics;  Laterality: Right;  . BREAST RECONSTRUCTION WITH PLACEMENT OF TISSUE EXPANDER AND FLEX HD (ACELLULAR HYDRATED DERMIS) Right 03/19/2018   Procedure: BREAST RECONSTRUCTION WITH PLACEMENT OF TISSUE EXPANDER AND FLEX HD (ACELLULAR HYDRATED DERMIS);  Surgeon: Wallace Going, DO;  Location: [facility];  Service: Plastics;  Laterality: Right;  . CHOLECYSTECTOMY N/A 04/27/2020   Procedure: LAPAROSCOPIC CHOLECYSTECTOMY WITH INTRAOPERATIVE CHOLANGIOGRAM;  Surgeon: Robert Bellow, MD;  Location: [facility];  Service: General;  Laterality: N/A;  . ESOPHAGOGASTRODUODENOSCOPY (EGD) WITH PROPOFOL N/A 04/17/2020   Procedure: ESOPHAGOGASTRODUODENOSCOPY (EGD) WITH PROPOFOL;  Surgeon: Robert Bellow, MD;  Location: [facility];  Service: Endoscopy;  Laterality: N/A;  with biopsy  . LAPAROSCOPIC BILATERAL SALPINGO OOPHERECTOMY Bilateral 03/19/2018   Procedure: LAPAROSCOPIC BILATERAL SALPINGO OOPHORECTOMY;  Surgeon: Benjaman Kindler, MD;  Location: [facility];  Service: Gynecology;  Laterality: Bilateral;  . MASTECTOMY Right 03/2018  . MASTECTOMY  W/ SENTINEL NODE BIOPSY Right 03/19/2018   Procedure: MASTECTOMY WITH SENTINEL LYMPH NODE BIOPSY;  Surgeon: Robert Bellow, MD;  Location: [facility];  Service:  General;  Laterality: Right;  . PORT-A-CATH REMOVAL Left 06/03/2019   Procedure: REMOVAL PORT-A-CATH;  Surgeon: Robert Bellow, MD;  Location: [facility];  Service: General;  Laterality: Left;  . PORTACATH PLACEMENT Left 10/24/2017   Procedure: INSERTION PORT-A-CATH;  Surgeon: Robert Bellow, MD;  Location: [facility];  Service: General;  Laterality: Left;  . REMOVAL OF TISSUE EXPANDER AND PLACEMENT OF IMPLANT Right 07/20/2018   Procedure: REMOVAL OF RIGHT BREAST TISSUE EXPANDER AND PLACEMENT OF IMPLANT;  Surgeon: Wallace Going, DO;  Location: [facility];  Service: Plastics;  Laterality: Right;  . SIMPLE MASTECTOMY WITH AXILLARY SENTINEL NODE BIOPSY Left 06/03/2019   Procedure: SIMPLE MASTECTOMY LEFT;  Surgeon: Robert Bellow, MD;  Location: [facility];  Service: General;  Laterality: Left;    Social History   Socioeconomic History  . Marital status: Married    Spouse name: Not on file  . Number of children: Not on file  . Years of education: Not on file  . Highest education level: Not on file  Occupational History  . Occupation: Occupational psychologist    Comment: Therapist, art   Tobacco Use  . Smoking status: Current Every Day Smoker    Packs/day: 0.50    Years: 18.00    Pack years: 9.00    Types: Cigarettes    Start date: 06/21/2018  . Smokeless tobacco: Never Used  Vaping Use  . Vaping Use: Never used  Substance and Sexual Activity  . Alcohol use: No  . Drug use: No  . Sexual activity: Yes    Birth control/protection: Injection, Other-see comments    Comment: has had hysterectomy  Other Topics Concern  . Not on file  Social History Narrative   Lives at home with husband and daughter   Social Determinants of Health   Financial Resource Strain:   . Difficulty of Paying Living Expenses: Not on file  Food Insecurity:   . Worried About Charity fundraiser in the Last Year: Not on file  . Ran Out of Food in the Last Year: Not on  file  Transportation Needs:   . Lack of Transportation (Medical): Not on file  . Lack of Transportation (Non-Medical): Not on file  Physical Activity:   . Days of Exercise per Week: Not on file  . Minutes of Exercise per Session: Not on file  Stress:   . Feeling of Stress : Not on file  Social Connections:   . Frequency of Communication with Friends and Family: Not on file  . Frequency of Social Gatherings with Friends and Family: Not on file  . Attends Religious Services: Not on file  . Active Member of Clubs or Organizations: Not on file  . Attends Archivist Meetings: Not on file  . Marital Status: Not on file  Intimate Partner Violence:   . Fear of Current or Ex-Partner: Not on file  . Emotionally Abused: Not on file  . Physically Abused: Not on file  . Sexually Abused: Not on file     Family History  Problem Relation Age of Onset  . Melanoma Maternal Aunt        other aunts with BCC/SCC/Melanoma  . Diabetes Father   . Hypertension Father   . Hyperlipidemia Father   . Heart attack Father 33       "  mild"  . Bladder Cancer Maternal Grandmother   . Cervical cancer Maternal Aunt 64       daughter w/ cervical cancer as well  . Melanoma Maternal Uncle        other uncles with BCC/SCC/Melanoma     Current Outpatient Medications:  .  albuterol (VENTOLIN HFA) 108 (90 Base) MCG/ACT inhaler, Inhale 2 puffs into the lungs every 6 (six) hours as needed for wheezing or shortness of breath. , Disp: , Rfl:  .  Calcium Carbonate-Vitamin D3 (CALCIUM 600/VITAMIN D) 600-400 MG-UNIT TABS, Take 2 tablets by mouth daily., Disp: 180 tablet, Rfl: 0 .  cholecalciferol (VITAMIN D3) 25 MCG (1000 UNIT) tablet, Take 1 tablet (1,000 Units total) by mouth daily., Disp: 90 tablet, Rfl: 0 .  cyanocobalamin (,VITAMIN B-12,) 1000 MCG/ML injection, Inject 1,000 mcg into the skin every 30 (thirty) days., Disp: , Rfl:  .  cyclobenzaprine (FLEXERIL) 5 MG tablet, Take 5 mg by mouth 3 (three) times  daily., Disp: , Rfl:  .  dexamethasone (DECADRON) 4 MG tablet, Take 1 tablet (4 mg total) by mouth daily., Disp: 20 tablet, Rfl: 0 .  escitalopram (LEXAPRO) 20 MG tablet, Take 20 mg by mouth every morning. , Disp: , Rfl:  .  esomeprazole (NEXIUM) 40 MG capsule, Take 40 mg by mouth every morning. , Disp: , Rfl:  .  Eszopiclone 3 MG TABS, Take 3 mg by mouth at bedtime as needed., Disp: , Rfl:  .  folic acid (FOLVITE) 1 MG tablet, Take 1 mg by mouth daily., Disp: , Rfl:  .  ibuprofen (ADVIL) 800 MG tablet, Take 800 mg by mouth every 8 (eight) hours as needed for moderate pain. , Disp: , Rfl:  .  letrozole (FEMARA) 2.5 MG tablet, Take 2.5 mg by mouth every morning. , Disp: , Rfl:  .  loratadine (CLARITIN) 10 MG tablet, Take 10 mg by mouth every morning. , Disp: , Rfl:  .  LORazepam (ATIVAN) 1 MG tablet, Take 1 mg by mouth 3 (three) times daily., Disp: , Rfl:  .  Magnesium 400 MG TABS, Take 400 mg by mouth 2 (two) times daily., Disp: , Rfl:  .  meloxicam (MOBIC) 7.5 MG tablet, Take 7.5 mg by mouth 2 (two) times a day., Disp: , Rfl:  .  methotrexate (RHEUMATREX) 2.5 MG tablet, Take 20 mg by mouth every Sunday. , Disp: , Rfl:  .  metoprolol succinate (TOPROL-XL) 25 MG 24 hr tablet, Take 37.5 mg by mouth every morning. , Disp: , Rfl:  .  nortriptyline (PAMELOR) 10 MG capsule, Take 30 mg by mouth at bedtime. , Disp: , Rfl:  .  oxyCODONE (OXY IR/ROXICODONE) 5 MG immediate release tablet, Take 1-2 tablets (5-10 mg total) by mouth every 6 (six) hours as needed for moderate pain or severe pain., Disp: 60 tablet, Rfl: 0 .  oxyCODONE ER (XTAMPZA ER) 13.5 MG C12A, Take 13.5 mg by mouth every 12 (twelve) hours., Disp: 14 capsule, Rfl: 0 .  phentermine (ADIPEX-P) 37.5 MG tablet, Take 37.5 mg by mouth daily before breakfast. , Disp: , Rfl:  .  pregabalin (LYRICA) 300 MG capsule, Take 1 capsule (300 mg total) by mouth daily. (Patient taking differently: Take 300 mg by mouth every morning. ), Disp: 30 capsule, Rfl:  3 .  promethazine (PHENERGAN) 25 MG tablet, Take 1 tablet (25 mg total) by mouth every 8 (eight) hours as needed for nausea or vomiting., Disp: 90 tablet, Rfl: 0 .  pyridOXINE (VITAMIN B-6) 100  MG tablet, Take 100 mg by mouth daily., Disp: , Rfl:  .  sucralfate (CARAFATE) 1 g tablet, Take 1 tablet (1 g total) by mouth 3 (three) times daily. Dissolve in 3-4 tbsp warm water, swish and swallow., Disp: 90 tablet, Rfl: 1 .  topiramate (TOPAMAX) 50 MG tablet, Take 50 mg by mouth 2 (two) times daily. , Disp: , Rfl:  .  vitamin C (ASCORBIC ACID) 500 MG tablet, Take 500 mg by mouth daily., Disp: , Rfl:  .  lidocaine (LIDODERM) 5 %, Place 1 patch onto the skin every 12 (twelve) hours. Remove & Discard patch within 12 hours or as directed by MD (Patient not taking: Reported on 08/31/2020), Disp: 10 patch, Rfl: 0 .  magic mouthwash SOLN, Take 5 mLs by mouth 4 (four) times daily., Disp: 480 mL, Rfl: 1 No current facility-administered medications for this visit.  Facility-Administered Medications Ordered in Other Visits:  .  heparin lock flush 100 unit/mL, 500 Units, Intravenous, Once, Earlie Server, MD .  sodium chloride flush (NS) 0.9 % injection 10 mL, 10 mL, Intravenous, PRN, Earlie Server, MD, 10 mL at 11/19/18 1249 .  sodium chloride flush (NS) 0.9 % injection 10 mL, 10 mL, Intravenous, Once, Earlie Server, MD   Physical exam:  Vitals:   08/31/20 1107  BP: 110/66  Pulse: (!) 110  Resp: 18  Temp: (!) 97.4 F (36.3 C)  TempSrc: Tympanic  SpO2: 98%  Weight: 190 lb 11.2 oz (86.5 kg)  ECOG 1 Physical Exam Constitutional:      General: She is not in acute distress.    Appearance: She is not diaphoretic.  HENT:     Head: Normocephalic and atraumatic.     Nose: Nose normal.     Mouth/Throat:     Pharynx: No oropharyngeal exudate.  Eyes:     General: No scleral icterus.       Left eye: No discharge.     Conjunctiva/sclera: Conjunctivae normal.     Pupils: Pupils are equal, round, and reactive to light.   Neck:     Vascular: No JVD.  Cardiovascular:     Rate and Rhythm: Normal rate and regular rhythm.     Heart sounds: Normal heart sounds. No murmur heard.   Pulmonary:     Effort: Pulmonary effort is normal. No respiratory distress.     Breath sounds: Normal breath sounds. No wheezing or rales.  Chest:     Chest wall: No tenderness.  Abdominal:     General: Bowel sounds are normal. There is no distension.     Palpations: Abdomen is soft. There is no mass.     Tenderness: There is no abdominal tenderness. There is no rebound.  Musculoskeletal:        General: Normal range of motion.     Cervical back: Normal range of motion and neck supple.  Lymphadenopathy:     Cervical: No cervical adenopathy.  Skin:    General: Skin is warm and dry.     Findings: No erythema or rash.  Neurological:     Mental Status: She is alert and oriented to person, place, and time.     Cranial Nerves: No cranial nerve deficit.     Motor: No abnormal muscle tone.     Coordination: Coordination normal.  Psychiatric:        Mood and Affect: Affect normal.         CMP Latest Ref Rng & Units 07/22/2020  Glucose 70 - 99  mg/dL 107(H)  BUN 6 - 20 mg/dL 17  Creatinine 0.44 - 1.00 mg/dL 0.75  Sodium 135 - 145 mmol/L 139  Potassium 3.5 - 5.1 mmol/L 3.8  Chloride 98 - 111 mmol/L 105  CO2 22 - 32 mmol/L 25  Calcium 8.9 - 10.3 mg/dL 9.2  Total Protein 6.5 - 8.1 g/dL 7.9  Total Bilirubin 0.3 - 1.2 mg/dL 0.5  Alkaline Phos 38 - 126 U/L 90  AST 15 - 41 U/L 25  ALT 0 - 44 U/L 22   CBC Latest Ref Rng & Units 08/25/2020  WBC 4.0 - 10.5 K/uL 9.2  Hemoglobin 12.0 - 15.0 g/dL 12.2  Hematocrit 36 - 46 % 36.0  Platelets 150 - 400 K/uL 317   RADIOGRAPHIC STUDIES: I have personally reviewed the radiological images as listed and agreed with the findings in the report. 08/23/2018 DEXA The BMD measured at Femur Neck Right is 0.900 g/cm2 with a T-score of -1.0. This patient is considered normal according to [facility]) criteria. Site Region Measured Measured WHO Young Adult BMD Date       Age      Classification T-score AP Spine L1-L4 08/23/2018 35.5 Normal -0.8 1.091 g/cm2 DualFemur Neck Right 08/23/2018 35.5 Normal -1.0 0.900 g/cm2  08/15/2018 2D echo Study Conclusions - Left ventricle: The cavity size was mildly dilated. Systolic   function was mildly reduced. The estimated ejection fraction was   45%. Diffuse hypokinesis. Hypokinesis of the anteroseptal   myocardium. Doppler parameters are consistent with abnormal left   ventricular relaxation (grade 1 diastolic dysfunction). - Aortic valve: Valve area (VTI): 1.88 cm^2. Valve area (Vmax): 1.8   cm^2. Valve area (Vmean): 1.74 cm^2. - Mitral valve: There was mild regurgitation. Valve area by   continuity equation (using LVOT flow): 2.22 cm^2. - Left atrium: The atrium was mildly dilated. - Right ventricle: The cavity size was moderately dilated. - Right atrium: The atrium was mildly dilated.  RADIOGRAPHIC STUDIES: I have personally reviewed the radiological images as listed and agreed with the findings in the report. 10/30/2018 Bone scan whole body Non scintigraphic evidence of osseous metastatic disease.  Assessment and plan- Patient is a 39 y.o. female presents for evaluation of newly diagnosed multicentric right breast cancer.  Cancer Staging Breast cancer of upper-outer quadrant of right female breast [facility]) Staging form: Breast, AJCC 8th Edition - Clinical: G2, ER+, PR+, HER2- - Signed by Earlie Server, MD on 04/05/2018 - Pathologic stage from 04/04/2018: No Stage Recommended (ypT3, pN2, cM0, G3, ER+, PR-, HER2-) - Signed by Earlie Server, MD on 04/04/2018    1. HER2-positive carcinoma of breast ([place])   2. Metastatic breast cancer ([place])   3. Goals of care, counseling/discussion   4. Neoplasm related pain   5. Radiation-induced esophagitis   Cancer Staging Breast cancer of upper-outer quadrant of right female breast  ([place]) Staging form: Breast, AJCC 8th Edition - Clinical: G2, ER+, PR+, HER2- - Signed by Earlie Server, MD on 04/05/2018 - Pathologic stage from 04/04/2018: No Stage Recommended (ypT3, pN2, cM0, G3, ER+, PR-, HER2-) - Signed by Earlie Server, MD on 04/04/2018  Breast cancer with isolated bone metastasis #Initially stage IIIA right breast cancer, ER positive, HER-2 negative status post bilateral oophorectomy, Right mastectomy and right axillary lymph node dissection, status post implant, and implant removal.  Status post left mastectomy and axillary sentinel lymph node biopsy. Now she has developed stage IV disease with biopsy-proven thoracic spine bone metastasis. Estrogen positive, HER-2 positive breast  cancer. Status post radiation to T7 bone metastasis.  Goals of care is with palliative intent.  Patient understands that her disease is not curable Plan to proceed with HER-2 directed therapy trastuzumab and Pertuzumab.  I will obtain echocardiogram. Rationale and potential side effects were discussed with the patient.  She agrees with the treatment plan. Patient has underlying grade 2 neuropathy from previous chemotherapy.  I will hold off Taxol for now. Patient is on letrozole with bilateral oophorectomy, plan to switch to fulvestrant as second line of treatment. We discussed about timing of repeat images.   #Radiation induced esophagitis, continue PPI, sucralfate, finished a course of steroids Dysphagia, recommend Magic mouthwash with lidocaine, swish and swallow  #Neoplasm related pain, continue current pain regimen  #Bone metastasis, previously patient has been on Zometa every 6 months.  Now she has bone disease I will switch to Zometa monthly.  Continue calcium and vitamin D supplementation.  #Chemotherapy-induced neuropathy, continue Lyrica and nortriptyline.  #Depression.  Continue Lexapro 20 mg daily. #Mediport has been removed.  Will use patient peripheral IV line for now.   Follow-up 2  weeks  Earlie Server, MD, PhD Hematology Oncology [place] at [facility] 08/31/20

## 2020-09-01 ENCOUNTER — Telehealth: Payer: Self-pay

## 2020-09-01 ENCOUNTER — Other Ambulatory Visit: Payer: Self-pay | Admitting: Oncology

## 2020-09-01 DIAGNOSIS — C50811 Malignant neoplasm of overlapping sites of right female breast: Secondary | ICD-10-CM

## 2020-09-01 DIAGNOSIS — Z170 Estrogen receptor positive status [ER+]: Secondary | ICD-10-CM

## 2020-09-01 NOTE — Telephone Encounter (Signed)
Called Dr. Darnelle Bos office and they do not have availability for an echo until 09/22/20.

## 2020-09-01 NOTE — Telephone Encounter (Addendum)
Confirmed with patient that she has been seen by Dr. Saunders Revel.

## 2020-09-01 NOTE — Telephone Encounter (Signed)
-----   Message from Earlie Server, MD sent at 08/31/2020  8:17 PM EDT ----- Please schedule her to get 2D echo asap, it needs to be before her next chemo.  She has been seen by cardiology before, in case if no available date, please communicate with her cardiologist to get echo done earlier. Thanks.

## 2020-09-01 NOTE — Telephone Encounter (Signed)
MD wants to get scheduled with [facility] for the echo.  Please let me know when you get it scheduled.

## 2020-09-01 NOTE — Telephone Encounter (Signed)
Message left on [place] D. VM to see about getting pt sched. for a 2D Echo per MD.

## 2020-09-02 NOTE — Telephone Encounter (Signed)
Patient notified of appts for 2d Echo on 10/4 arrive at 9:45 for a 10 :00.  The appts at [facility] moved to 10/7 starting with labs at 8:00.

## 2020-09-04 ENCOUNTER — Encounter: Payer: Self-pay | Admitting: Oncology

## 2020-09-09 NOTE — Progress Notes (Signed)
Pharmacist Chemotherapy Monitoring - Initial Assessment    Anticipated start date: 09/17/20  Regimen:  . Are orders appropriate based on the patient's diagnosis, regimen, and cycle? Yes . Does the plan date match the patient's scheduled date? Yes . Is the sequencing of drugs appropriate? Yes . Are the premedications appropriate for the patient's regimen? Yes . Prior Authorization for treatment is: Approved o If applicable, is the correct biosimilar selected based on the patient's insurance? yes  Organ Function and Labs: [name] Are dose adjustments needed based on the patient's renal function, hepatic function, or hematologic function? Yes . Are appropriate labs ordered prior to the start of patient's treatment? Yes . Other organ system assessment, if indicated: trastuzumab: Echo/ MUGA, pertuzumab: Echo/ MUGA and women of childbearing potential: pregnancy status  . The following baseline labs, if indicated, have been ordered: N/A  Dose Assessment: . Are the drug doses appropriate? Yes . Are the following correct: o Drug concentrations Yes o IV fluid compatible with drug Yes o Administration routes Yes o Timing of therapy Yes . If applicable, does the patient have documented access for treatment and/or plans for port-a-cath placement? no . If applicable, have lifetime cumulative doses been properly documented and assessed? yes Lifetime Dose Tracking  . Doxorubicin: 240.114 mg/m2 (440 mg) = 53.36 % of the maximum lifetime dose of 450 mg/m2  o   Toxicity Monitoring/Prevention: . The patient has the following take home antiemetics prescribed: Dexamethasone and Lorazepam . The patient has the following take home medications prescribed: N/A . Medication allergies and previous infusion related reactions, if applicable, have been reviewed and addressed. Yes . The patient's current medication list has been assessed for drug-drug interactions with their chemotherapy regimen. no significant  drug-drug interactions were identified on review.  Order Review: . Are the treatment plan orders signed? No . Is the patient scheduled to see a provider prior to their treatment? Yes  I verify that I have reviewed each item in the above checklist and answered each question accordingly.  Adelina Mings 09/09/2020 3:49 PM

## 2020-09-14 ENCOUNTER — Ambulatory Visit
Admission: RE | Admit: 2020-09-14 | Discharge: 2020-09-14 | Disposition: A | Discharge status: Home / Self Care | Payer: Medicare HMO | Source: Ambulatory Visit | Attending: Oncology | Admitting: Oncology

## 2020-09-14 ENCOUNTER — Other Ambulatory Visit: Payer: Self-pay

## 2020-09-14 ENCOUNTER — Ambulatory Visit: Payer: Medicare HMO | Admitting: Oncology

## 2020-09-14 ENCOUNTER — Ambulatory Visit: Payer: Medicare HMO

## 2020-09-14 ENCOUNTER — Other Ambulatory Visit: Payer: Medicare HMO

## 2020-09-14 DIAGNOSIS — I429 Cardiomyopathy, unspecified: Secondary | ICD-10-CM | POA: Insufficient documentation

## 2020-09-14 DIAGNOSIS — Z170 Estrogen receptor positive status [ER+]: Secondary | ICD-10-CM | POA: Insufficient documentation

## 2020-09-14 DIAGNOSIS — C50811 Malignant neoplasm of overlapping sites of right female breast: Secondary | ICD-10-CM | POA: Diagnosis not present

## 2020-09-14 DIAGNOSIS — J449 Chronic obstructive pulmonary disease, unspecified: Secondary | ICD-10-CM | POA: Diagnosis not present

## 2020-09-14 DIAGNOSIS — Z01818 Encounter for other preprocedural examination: Secondary | ICD-10-CM | POA: Insufficient documentation

## 2020-09-14 LAB — ECHOCARDIOGRAM LIMITED: S' Lateral: 2.95 cm

## 2020-09-14 NOTE — Progress Notes (Signed)
*  PRELIMINARY RESULTS* Echocardiogram 2D Echocardiogram has been performed.  Theresa Chaney 09/14/2020, 10:08 AM

## 2020-09-17 ENCOUNTER — Encounter: Payer: Self-pay | Admitting: Oncology

## 2020-09-17 ENCOUNTER — Inpatient Hospital Stay: Payer: Medicare HMO

## 2020-09-17 ENCOUNTER — Other Ambulatory Visit: Payer: Self-pay | Admitting: General Surgery

## 2020-09-17 ENCOUNTER — Inpatient Hospital Stay: Payer: Medicare HMO | Attending: Oncology

## 2020-09-17 ENCOUNTER — Other Ambulatory Visit: Payer: Self-pay

## 2020-09-17 ENCOUNTER — Inpatient Hospital Stay: Payer: Medicare HMO | Admitting: Oncology

## 2020-09-17 VITALS — BP 118/72 | HR 101 | Resp 20

## 2020-09-17 VITALS — BP 113/75 | HR 102 | Temp 97.8°F | Resp 18 | Wt 189.8 lb

## 2020-09-17 DIAGNOSIS — C50919 Malignant neoplasm of unspecified site of unspecified female breast: Secondary | ICD-10-CM | POA: Diagnosis not present

## 2020-09-17 DIAGNOSIS — Z170 Estrogen receptor positive status [ER+]: Secondary | ICD-10-CM

## 2020-09-17 DIAGNOSIS — K208 Other esophagitis without bleeding: Secondary | ICD-10-CM

## 2020-09-17 DIAGNOSIS — C7951 Secondary malignant neoplasm of bone: Secondary | ICD-10-CM | POA: Diagnosis not present

## 2020-09-17 DIAGNOSIS — C50812 Malignant neoplasm of overlapping sites of left female breast: Secondary | ICD-10-CM | POA: Insufficient documentation

## 2020-09-17 DIAGNOSIS — C50811 Malignant neoplasm of overlapping sites of right female breast: Secondary | ICD-10-CM | POA: Diagnosis not present

## 2020-09-17 DIAGNOSIS — T66XXXA Radiation sickness, unspecified, initial encounter: Secondary | ICD-10-CM

## 2020-09-17 DIAGNOSIS — C50411 Malignant neoplasm of upper-outer quadrant of right female breast: Secondary | ICD-10-CM | POA: Insufficient documentation

## 2020-09-17 DIAGNOSIS — G620 Drug-induced polyneuropathy: Secondary | ICD-10-CM | POA: Diagnosis not present

## 2020-09-17 DIAGNOSIS — T451X5A Adverse effect of antineoplastic and immunosuppressive drugs, initial encounter: Secondary | ICD-10-CM

## 2020-09-17 DIAGNOSIS — Z5111 Encounter for antineoplastic chemotherapy: Secondary | ICD-10-CM | POA: Diagnosis not present

## 2020-09-17 LAB — CBC WITH DIFFERENTIAL/PLATELET
Abs Immature Granulocytes: 0.05 10*3/uL (ref 0.00–0.07)
Basophils Absolute: 0.1 10*3/uL (ref 0.0–0.1)
Basophils Relative: 1 %
Eosinophils Absolute: 0.1 10*3/uL (ref 0.0–0.5)
Eosinophils Relative: 1 %
HCT: 35.5 % — ABNORMAL LOW (ref 36.0–46.0)
Hemoglobin: 12.1 g/dL (ref 12.0–15.0)
Immature Granulocytes: 1 %
Lymphocytes Relative: 18 %
Lymphs Abs: 1.9 10*3/uL (ref 0.7–4.0)
MCH: 30.4 pg (ref 26.0–34.0)
MCHC: 34.1 g/dL (ref 30.0–36.0)
MCV: 89.2 fL (ref 80.0–100.0)
Monocytes Absolute: 0.9 10*3/uL (ref 0.1–1.0)
Monocytes Relative: 9 %
Neutro Abs: 7.5 10*3/uL (ref 1.7–7.7)
Neutrophils Relative %: 70 %
Platelets: 300 10*3/uL (ref 150–400)
RBC: 3.98 MIL/uL (ref 3.87–5.11)
RDW: 13.9 % (ref 11.5–15.5)
WBC: 10.6 10*3/uL — ABNORMAL HIGH (ref 4.0–10.5)
nRBC: 0 % (ref 0.0–0.2)

## 2020-09-17 LAB — COMPREHENSIVE METABOLIC PANEL
ALT: 21 U/L (ref 0–44)
AST: 24 U/L (ref 15–41)
Albumin: 4.2 g/dL (ref 3.5–5.0)
Alkaline Phosphatase: 77 U/L (ref 38–126)
Anion gap: 9 (ref 5–15)
BUN: 16 mg/dL (ref 6–20)
CO2: 22 mmol/L (ref 22–32)
Calcium: 9.1 mg/dL (ref 8.9–10.3)
Chloride: 107 mmol/L (ref 98–111)
Creatinine, Ser: 0.7 mg/dL (ref 0.44–1.00)
GFR calc non Af Amer: >60 mL/min (ref >60)
Glucose, Bld: 104 mg/dL — ABNORMAL HIGH (ref 70–99)
Potassium: 3.6 mmol/L (ref 3.5–5.1)
Sodium: 138 mmol/L (ref 135–145)
Total Bilirubin: 0.6 mg/dL (ref 0.3–1.2)
Total Protein: 7.4 g/dL (ref 6.5–8.1)

## 2020-09-17 MED ORDER — ZOLEDRONIC ACID 4 MG/100ML IV SOLN
4.0000 mg | Freq: Once | INTRAVENOUS | Status: AC
  Administered 2020-09-17: 4 mg via INTRAVENOUS
  Filled 2020-09-17: qty 100

## 2020-09-17 MED ORDER — DIPHENHYDRAMINE HCL 25 MG PO CAPS
50.0000 mg | ORAL_CAPSULE | Freq: Once | ORAL | Status: AC
  Administered 2020-09-17: 50 mg via ORAL
  Filled 2020-09-17: qty 2

## 2020-09-17 MED ORDER — FULVESTRANT 250 MG/5ML IM SOLN
500.0000 mg | Freq: Once | INTRAMUSCULAR | Status: AC
  Administered 2020-09-17: 500 mg via INTRAMUSCULAR
  Filled 2020-09-17: qty 10

## 2020-09-17 MED ORDER — TRASTUZUMAB-ANNS CHEMO 150 MG IV SOLR
8.0000 mg/kg | Freq: Once | INTRAVENOUS | Status: AC
  Administered 2020-09-17: 693 mg via INTRAVENOUS
  Filled 2020-09-17: qty 33

## 2020-09-17 MED ORDER — LOPERAMIDE HCL 2 MG PO TABS: 2.0000 mg | ORAL_TABLET | ORAL | 0 refills | Status: DC

## 2020-09-17 MED ORDER — SODIUM CHLORIDE 0.9 % IV SOLN
Freq: Once | INTRAVENOUS | Status: AC
  Filled 2020-09-17: qty 250

## 2020-09-17 MED ORDER — ACETAMINOPHEN 325 MG PO TABS
650.0000 mg | ORAL_TABLET | Freq: Once | ORAL | Status: AC
  Administered 2020-09-17: 650 mg via ORAL
  Filled 2020-09-17: qty 2

## 2020-09-17 MED ORDER — SODIUM CHLORIDE 0.9 % IV SOLN
840.0000 mg | Freq: Once | INTRAVENOUS | Status: AC
  Administered 2020-09-17: 840 mg via INTRAVENOUS
  Filled 2020-09-17: qty 28

## 2020-09-17 MED ORDER — XTAMPZA ER 13.5 MG PO C12A
13.5000 mg | EXTENDED_RELEASE_CAPSULE | Freq: Two times a day (BID) | ORAL | 0 refills | Status: DC
Start: 2020-09-17 — End: 2020-12-10

## 2020-09-17 NOTE — Progress Notes (Signed)
Patient would like a refill on Oxycodone ER and discuss a prescription for possible mouth sores.

## 2020-09-17 NOTE — Progress Notes (Signed)
Hematology/Oncology Follow Up Note [place] [phone] [phone]  Patient Care Team: Marguerita Merles, MD as PCP - General (Family Medicine) End, Harrell Gave, MD as PCP - Cardiology (Cardiology) Rico Junker, RN as Oncology Nurse Navigator Earlie Server, MD as Consulting Physician (Oncology) Bary Castilla, Forest Gleason, MD (General Surgery) Noreene Filbert, MD as Referring Physician (Radiation Oncology) Noreene Filbert, MD as Radiation Oncologist (Radiation Oncology) Noreene Filbert, MD as Radiation Oncologist (Radiation Oncology)   Name of the patient: Theresa Chaney  [id number]  06-Jul-1981   Date of visit: 09/17/20 REASON FOR VISIT Follow up for Assessment prior to chemotherapy treatment of breast cancer  Oncology History Cancer TREATMENT Neoadjuvant ddAC +one dose of Taxol, due to lack of response, surgery was offered. Case was discussed on breast tumor board. 03/19/2018 S/p right mastectomy and right axillary dissection, immediate breast reconstruction with placement of expanders.  ypT3 ypN2, + lymphovascular invasion,  Grade 3, margin is negative, close. ER 90%, PR 0%, HER2 IHC negative.  Also had elective bilateral salpingo-oophorectomy..  # 06/11/2018 s/p 11 cycles Taxol adjuvantly. Tolerated well.  # She has obtained dental clearance for starting Zometa.  S/p Zometa on 6/3/ 2019 # s/p adjuvant right chest wall radiation, finished 10/10/2018 #06/03/2019 underwent elective left prophylactic mastectomy and sentinel lymph node biopsy of left axilla.  # 06/03/2019 underwent elective left prophylactic mastectomy and sentinel lymph node biopsy of left axilla. Pathology negative for malignancy. Status post Mediport removal on 06/03/2019. She also underwent right implant removal on 06/03/2019. She also underwent right implant removal on 06/03/2019. #Negative genetic testing  #  chemotherapy-induced neuropathy, bilateral fingertips and lower extremities.   Patient is on Lyrica and nortriptyline.  Follows up with neurology. She follows up with lymphedema clinic.  #  07/07/2020, MRI thoracic spine without contrast showed lesions involving the T7 posterior elements most concerning for metastatic lesion.  No evidence of epidural tumor.  Minimal thoracic spondylosis without stenosis. MRI was reviewed by me and a PET scan was obtained for further evaluation. 07/20/2020, PET scan showed hypermetabolic metastasis involving the posterior element of T7, no additional evidence of metastasis in the neck, chest, abdomen or pelvis.  # 07/29/2020 T7 lesion biopsy showed metastatic carcinoma, compatible with breast origin.  Receptor status staining showed ER 71-80% positive, PR negative, HER-2 positive IHC 3+ # Patient finished spine radiation on 08/31/2020  INTERVAL HISTORY 39 yo female with above oncology history reviewed by me presents for follow-up of management of metastatic breast cancer.  Patient reports that back pain has improved.  Sometimes 3 out of 10, sometimes 8 out of 10. She is currently on oxycodone 5 to 10 mg every 6 hours as needed plus Xtampza ER 13.5 mg twice daily. She requests  Xtampza ER to be reviewed.   Review of Systems  Constitutional: Negative for chills, fever, malaise/fatigue and weight loss.  HENT: Negative for sore throat.   Eyes: Negative for redness.  Respiratory: Negative for cough, shortness of breath and wheezing.   Cardiovascular: Negative for chest pain, palpitations and leg swelling.  Gastrointestinal: Negative for abdominal pain, blood in stool, heartburn, nausea and vomiting.       Odynophagia  Genitourinary: Negative for dysuria.  Musculoskeletal: Positive for back pain and joint pain. Negative for myalgias.  Skin: Negative for rash.  Neurological: Positive for tingling. Negative for dizziness and tremors.  Endo/Heme/Allergies: Does not bruise/bleed easily.  Psychiatric/Behavioral: Negative for hallucinations.    No  Known Allergies  Patient Active Problem List  Diagnosis Date Noted  . Metastatic breast cancer ([place]) 08/31/2020  . Goals of care, counseling/discussion 08/11/2020  . Neuropathy due to chemotherapeutic drug ([place]) 08/11/2020  . HER2-positive carcinoma of breast ([place]) 08/11/2020  . Fracture of neck of metacarpal bone 05/14/2019  . Chronic fatigue 04/09/2019  . Polyarthralgia 04/09/2019  . Status post right breast reconstruction 02/26/2019  . Status post right mastectomy 02/26/2019  . Mastalgia 02/15/2019  . Shortness of breath 08/23/2018  . Nonischemic cardiomyopathy ([place]) 08/23/2018  . Preprocedural cardiovascular examination 08/23/2018  . Tachycardia 08/23/2018  . Palpitations 08/23/2018  . Estrogen receptor positive status (ER+) 04/04/2018  . Acquired absence of right breast and nipple 04/03/2018  . Breast cancer of upper-outer quadrant of right female breast ([place]) 03/19/2018  . Family history of cancer   . Malignant neoplasm of overlapping sites of right breast in female, estrogen receptor positive ([place]) 10/19/2017  . Gastroesophageal reflux disease without esophagitis 02/24/2017  . Generalized anxiety disorder 10/03/2014  . Headache 10/03/2014     Past Medical History:  Diagnosis Date  . Anemia   . Arthritis   . BRCA negative 11/26/2017  . Breast cancer ([place]) 10/11/2017   Multifocal, ER positive, PR negative, HER-2 negative. ypT3 ypN2a 8.7 cm, 4/15 nodes  . Cardiomyopathy ([place])    a. 10/2017 Echo: EF 60-65%, no rwma, Gr1 DD, nl RV size/fxn; b. 04/2018 Echo: EF 55-60%, no rwma, Nl RV size/fxn; c. 08/2018 Echo: EF 45%, diff HK, ? HK of antsept wall. Gr1 DD. Mild MR. Mild LAE/RAE. Mod dil RV.   [name] Chronic bronchitis ([place]) 11/2017  . COPD (chronic obstructive pulmonary disease) ([place])    MILD PER CXR  . Depression   . Family history of cancer   . GERD (gastroesophageal reflux disease)   . Headache    MIGRAINES  . Heart murmur    ASYMPTOMATIC  . Personal history of  chemotherapy    current for right breast ca     Past Surgical History:  Procedure Laterality Date  . AXILLARY LYMPH NODE DISSECTION Right 03/19/2018   Procedure: AXILLARY LYMPH NODE DISSECTION;  Surgeon: Robert Bellow, MD;  Location: [facility];  Service: General;  Laterality: Right;  . BREAST BIOPSY Right 10/11/2017   12:30 posterior coil clip invasive mammary carcinoma  . BREAST BIOPSY Right 10/11/2017   11:30 middle depth ribbon clip DCIS  . BREAST BIOPSY Right 10/11/2017   5:30 anterior depth x shape invasive ductal carcinoma  . BREAST IMPLANT REMOVAL Right 06/03/2019   Procedure: REMOVAL OF RIGHT BREAST IMPLANTS;  Surgeon: Wallace Going, DO;  Location: [facility];  Service: Plastics;  Laterality: Right;  . BREAST RECONSTRUCTION WITH PLACEMENT OF TISSUE EXPANDER AND FLEX HD (ACELLULAR HYDRATED DERMIS) Right 03/19/2018   Procedure: BREAST RECONSTRUCTION WITH PLACEMENT OF TISSUE EXPANDER AND FLEX HD (ACELLULAR HYDRATED DERMIS);  Surgeon: Wallace Going, DO;  Location: [facility];  Service: Plastics;  Laterality: Right;  . CHOLECYSTECTOMY N/A 04/27/2020   Procedure: LAPAROSCOPIC CHOLECYSTECTOMY WITH INTRAOPERATIVE CHOLANGIOGRAM;  Surgeon: Robert Bellow, MD;  Location: [facility];  Service: General;  Laterality: N/A;  . ESOPHAGOGASTRODUODENOSCOPY (EGD) WITH PROPOFOL N/A 04/17/2020   Procedure: ESOPHAGOGASTRODUODENOSCOPY (EGD) WITH PROPOFOL;  Surgeon: Robert Bellow, MD;  Location: [facility];  Service: Endoscopy;  Laterality: N/A;  with biopsy  . LAPAROSCOPIC BILATERAL SALPINGO OOPHERECTOMY Bilateral 03/19/2018   Procedure: LAPAROSCOPIC BILATERAL SALPINGO OOPHORECTOMY;  Surgeon: Benjaman Kindler, MD;  Location: [facility];  Service: Gynecology;  Laterality: Bilateral;  . MASTECTOMY Right 03/2018  .  MASTECTOMY W/ SENTINEL NODE BIOPSY Right 03/19/2018   Procedure: MASTECTOMY WITH SENTINEL LYMPH NODE BIOPSY;  Surgeon: Robert Bellow, MD;  Location: [facility];  Service: General;   Laterality: Right;  . PORT-A-CATH REMOVAL Left 06/03/2019   Procedure: REMOVAL PORT-A-CATH;  Surgeon: Robert Bellow, MD;  Location: [facility];  Service: General;  Laterality: Left;  . PORTACATH PLACEMENT Left 10/24/2017   Procedure: INSERTION PORT-A-CATH;  Surgeon: Robert Bellow, MD;  Location: [facility];  Service: General;  Laterality: Left;  . REMOVAL OF TISSUE EXPANDER AND PLACEMENT OF IMPLANT Right 07/20/2018   Procedure: REMOVAL OF RIGHT BREAST TISSUE EXPANDER AND PLACEMENT OF IMPLANT;  Surgeon: Wallace Going, DO;  Location: [place];  Service: Plastics;  Laterality: Right;  . SIMPLE MASTECTOMY WITH AXILLARY SENTINEL NODE BIOPSY Left 06/03/2019   Procedure: SIMPLE MASTECTOMY LEFT;  Surgeon: Robert Bellow, MD;  Location: [facility];  Service: General;  Laterality: Left;    Social History   Socioeconomic History  . Marital status: Married    Spouse name: Not on file  . Number of children: Not on file  . Years of education: Not on file  . Highest education level: Not on file  Occupational History  . Occupation: Occupational psychologist    Comment: Therapist, art   Tobacco Use  . Smoking status: Current Every Day Smoker    Packs/day: 0.50    Years: 18.00    Pack years: 9.00    Types: Cigarettes    Start date: 06/21/2018  . Smokeless tobacco: Never Used  Vaping Use  . Vaping Use: Never used  Substance and Sexual Activity  . Alcohol use: No  . Drug use: No  . Sexual activity: Yes    Birth control/protection: Injection, Other-see comments    Comment: has had hysterectomy  Other Topics Concern  . Not on file  Social History Narrative   Lives at home with husband and daughter   Social Determinants of Health   Financial Resource Strain:   . Difficulty of Paying Living Expenses: Not on file  Food Insecurity:   . Worried About Charity fundraiser in the Last Year: Not on file  . Ran Out of Food in the Last Year: Not on file   Transportation Needs:   . Lack of Transportation (Medical): Not on file  . Lack of Transportation (Non-Medical): Not on file  Physical Activity:   . Days of Exercise per Week: Not on file  . Minutes of Exercise per Session: Not on file  Stress:   . Feeling of Stress : Not on file  Social Connections:   . Frequency of Communication with Friends and Family: Not on file  . Frequency of Social Gatherings with Friends and Family: Not on file  . Attends Religious Services: Not on file  . Active Member of Clubs or Organizations: Not on file  . Attends Archivist Meetings: Not on file  . Marital Status: Not on file  Intimate Partner Violence:   . Fear of Current or Ex-Partner: Not on file  . Emotionally Abused: Not on file  . Physically Abused: Not on file  . Sexually Abused: Not on file     Family History  Problem Relation Age of Onset  . Melanoma Maternal Aunt        other aunts with BCC/SCC/Melanoma  . Diabetes Father   . Hypertension Father   . Hyperlipidemia Father   . Heart attack Father 47       "  mild"  . Bladder Cancer Maternal Grandmother   . Cervical cancer Maternal Aunt 64       daughter w/ cervical cancer as well  . Melanoma Maternal Uncle        other uncles with BCC/SCC/Melanoma     Current Outpatient Medications:  .  albuterol (VENTOLIN HFA) 108 (90 Base) MCG/ACT inhaler, Inhale 2 puffs into the lungs every 6 (six) hours as needed for wheezing or shortness of breath. , Disp: , Rfl:  .  Calcium Carbonate-Vitamin D3 (CALCIUM 600/VITAMIN D) 600-400 MG-UNIT TABS, Take 2 tablets by mouth daily., Disp: 180 tablet, Rfl: 0 .  cholecalciferol (VITAMIN D3) 25 MCG (1000 UNIT) tablet, Take 1 tablet (1,000 Units total) by mouth daily., Disp: 90 tablet, Rfl: 0 .  cyanocobalamin (,VITAMIN B-12,) 1000 MCG/ML injection, Inject 1,000 mcg into the skin every 30 (thirty) days., Disp: , Rfl:  .  cyclobenzaprine (FLEXERIL) 5 MG tablet, Take 5 mg by mouth 3 (three) times  daily., Disp: , Rfl:  .  dexamethasone (DECADRON) 4 MG tablet, Take 1 tablet (4 mg total) by mouth daily., Disp: 20 tablet, Rfl: 0 .  escitalopram (LEXAPRO) 20 MG tablet, Take 20 mg by mouth every morning. , Disp: , Rfl:  .  esomeprazole (NEXIUM) 40 MG capsule, Take 40 mg by mouth every morning. , Disp: , Rfl:  .  Eszopiclone 3 MG TABS, Take 3 mg by mouth at bedtime as needed., Disp: , Rfl:  .  folic acid (FOLVITE) 1 MG tablet, Take 1 mg by mouth daily., Disp: , Rfl:  .  ibuprofen (ADVIL) 800 MG tablet, Take 800 mg by mouth every 8 (eight) hours as needed for moderate pain. , Disp: , Rfl:  .  letrozole (FEMARA) 2.5 MG tablet, Take 2.5 mg by mouth every morning. , Disp: , Rfl:  .  loratadine (CLARITIN) 10 MG tablet, Take 10 mg by mouth every morning. , Disp: , Rfl:  .  LORazepam (ATIVAN) 1 MG tablet, Take 1 mg by mouth 3 (three) times daily., Disp: , Rfl:  .  magic mouthwash SOLN, Take 5 mLs by mouth 4 (four) times daily., Disp: 480 mL, Rfl: 1 .  Magnesium 400 MG TABS, Take 400 mg by mouth 2 (two) times daily., Disp: , Rfl:  .  meloxicam (MOBIC) 7.5 MG tablet, Take 7.5 mg by mouth 2 (two) times a day., Disp: , Rfl:  .  methotrexate (RHEUMATREX) 2.5 MG tablet, Take 20 mg by mouth every Sunday. , Disp: , Rfl:  .  metoprolol succinate (TOPROL-XL) 25 MG 24 hr tablet, Take 37.5 mg by mouth every morning. , Disp: , Rfl:  .  nortriptyline (PAMELOR) 10 MG capsule, Take 30 mg by mouth at bedtime. , Disp: , Rfl:  .  oxyCODONE (OXY IR/ROXICODONE) 5 MG immediate release tablet, Take 1-2 tablets (5-10 mg total) by mouth every 6 (six) hours as needed for moderate pain or severe pain., Disp: 60 tablet, Rfl: 0 .  oxyCODONE ER (XTAMPZA ER) 13.5 MG C12A, Take 13.5 mg by mouth every 12 (twelve) hours., Disp: 14 capsule, Rfl: 0 .  phentermine (ADIPEX-P) 37.5 MG tablet, Take 37.5 mg by mouth daily before breakfast. , Disp: , Rfl:  .  pregabalin (LYRICA) 300 MG capsule, Take 1 capsule (300 mg total) by mouth daily.  (Patient taking differently: Take 300 mg by mouth every morning. ), Disp: 30 capsule, Rfl: 3 .  promethazine (PHENERGAN) 25 MG tablet, Take 1 tablet (25 mg total) by mouth every 8 (  eight) hours as needed for nausea or vomiting., Disp: 90 tablet, Rfl: 0 .  pyridOXINE (VITAMIN B-6) 100 MG tablet, Take 100 mg by mouth daily., Disp: , Rfl:  .  sucralfate (CARAFATE) 1 g tablet, Take 1 tablet (1 g total) by mouth 3 (three) times daily. Dissolve in 3-4 tbsp warm water, swish and swallow., Disp: 90 tablet, Rfl: 1 .  topiramate (TOPAMAX) 50 MG tablet, Take 50 mg by mouth 2 (two) times daily. , Disp: , Rfl:  .  vitamin C (ASCORBIC ACID) 500 MG tablet, Take 500 mg by mouth daily., Disp: , Rfl:  .  lidocaine (LIDODERM) 5 %, Place 1 patch onto the skin every 12 (twelve) hours. Remove & Discard patch within 12 hours or as directed by MD (Patient not taking: Reported on 08/31/2020), Disp: 10 patch, Rfl: 0 No current facility-administered medications for this visit.  Facility-Administered Medications Ordered in Other Visits:  .  fulvestrant (FASLODEX) injection 500 mg, 500 mg, Intramuscular, Once, Earlie Server, MD .  heparin lock flush 100 unit/mL, 500 Units, Intravenous, Once, Earlie Server, MD .  pertuzumab (PERJETA) 840 mg in sodium chloride 0.9 % 250 mL chemo infusion, 840 mg, Intravenous, Once, Earlie Server, MD, Last Rate: 278 mL/hr at 09/17/20 1256, 840 mg at 09/17/20 1256 .  sodium chloride flush (NS) 0.9 % injection 10 mL, 10 mL, Intravenous, PRN, Earlie Server, MD, 10 mL at 11/19/18 1249 .  sodium chloride flush (NS) 0.9 % injection 10 mL, 10 mL, Intravenous, Once, Earlie Server, MD   Physical exam:  Vitals:   09/17/20 0838 09/17/20 0928  BP: 113/75   Pulse: (!) 105 (!) 102  Resp: 18   Temp: 97.8 F (36.6 C)   Weight: 189 lb 12.8 oz (86.1 kg)   ECOG 1 Physical Exam Constitutional:      General: She is not in acute distress.    Appearance: She is not diaphoretic.  HENT:     Head: Normocephalic and atraumatic.      Nose: Nose normal.     Mouth/Throat:     Pharynx: No oropharyngeal exudate.  Eyes:     General: No scleral icterus.       Left eye: No discharge.     Conjunctiva/sclera: Conjunctivae normal.     Pupils: Pupils are equal, round, and reactive to light.  Neck:     Vascular: No JVD.  Cardiovascular:     Rate and Rhythm: Normal rate and regular rhythm.     Heart sounds: Normal heart sounds. No murmur heard.   Pulmonary:     Effort: Pulmonary effort is normal. No respiratory distress.     Breath sounds: Normal breath sounds. No wheezing or rales.  Chest:     Chest wall: No tenderness.  Abdominal:     General: Bowel sounds are normal. There is no distension.     Palpations: Abdomen is soft. There is no mass.     Tenderness: There is no abdominal tenderness. There is no rebound.  Musculoskeletal:        General: Normal range of motion.     Cervical back: Normal range of motion and neck supple.  Lymphadenopathy:     Cervical: No cervical adenopathy.  Skin:    General: Skin is warm and dry.     Findings: No erythema or rash.  Neurological:     Mental Status: She is alert and oriented to person, place, and time.     Cranial Nerves: No cranial nerve deficit.  Motor: No abnormal muscle tone.     Coordination: Coordination normal.  Psychiatric:        Mood and Affect: Affect normal.         CMP Latest Ref Rng & Units 09/17/2020  Glucose 70 - 99 mg/dL 104(H)  BUN 6 - 20 mg/dL 16  Creatinine 0.44 - 1.00 mg/dL 0.70  Sodium 135 - 145 mmol/L 138  Potassium 3.5 - 5.1 mmol/L 3.6  Chloride 98 - 111 mmol/L 107  CO2 22 - 32 mmol/L 22  Calcium 8.9 - 10.3 mg/dL 9.1  Total Protein 6.5 - 8.1 g/dL 7.4  Total Bilirubin 0.3 - 1.2 mg/dL 0.6  Alkaline Phos 38 - 126 U/L 77  AST 15 - 41 U/L 24  ALT 0 - 44 U/L 21   CBC Latest Ref Rng & Units 09/17/2020  WBC 4.0 - 10.5 K/uL 10.6(H)  Hemoglobin 12.0 - 15.0 g/dL 12.1  Hematocrit 36 - 46 % 35.5(L)  Platelets 150 - 400 K/uL 300    RADIOGRAPHIC STUDIES: I have personally reviewed the radiological images as listed and agreed with the findings in the report. 08/23/2018 DEXA The BMD measured at Femur Neck Right is 0.900 g/cm2 with a T-score of -1.0. This patient is considered normal according to [facility]) criteria. Site Region Measured Measured WHO Young Adult BMD Date       Age      Classification T-score AP Spine L1-L4 08/23/2018 35.5 Normal -0.8 1.091 g/cm2 DualFemur Neck Right 08/23/2018 35.5 Normal -1.0 0.900 g/cm2  08/15/2018 2D echo Study Conclusions - Left ventricle: The cavity size was mildly dilated. Systolic   function was mildly reduced. The estimated ejection fraction was   45%. Diffuse hypokinesis. Hypokinesis of the anteroseptal   myocardium. Doppler parameters are consistent with abnormal left   ventricular relaxation (grade 1 diastolic dysfunction). - Aortic valve: Valve area (VTI): 1.88 cm^2. Valve area (Vmax): 1.8   cm^2. Valve area (Vmean): 1.74 cm^2. - Mitral valve: There was mild regurgitation. Valve area by   continuity equation (using LVOT flow): 2.22 cm^2. - Left atrium: The atrium was mildly dilated. - Right ventricle: The cavity size was moderately dilated. - Right atrium: The atrium was mildly dilated.  RADIOGRAPHIC STUDIES: I have personally reviewed the radiological images as listed and agreed with the findings in the report. 10/30/2018 Bone scan whole body Non scintigraphic evidence of osseous metastatic disease.  Assessment and plan- Patient is a 39 y.o. female presents for evaluation of newly diagnosed multicentric right breast cancer.  Cancer Staging Breast cancer of upper-outer quadrant of right female breast [facility]) Staging form: Breast, AJCC 8th Edition - Clinical: G2, ER+, PR+, HER2- - Signed by Earlie Server, MD on 04/05/2018 - Pathologic stage from 04/04/2018: No Stage Recommended (ypT3, pN2, cM0, G3, ER+, PR-, HER2-) - Signed by Earlie Server, MD on  04/04/2018    1. HER2-positive carcinoma of breast ([place])   2. Malignant neoplasm of overlapping sites of right breast in female, estrogen receptor positive ([place])   3. Radiation-induced esophagitis   4. Neuropathy due to chemotherapeutic drug [facility])   Cancer Staging Breast cancer of upper-outer quadrant of right female breast [facility]) Staging form: Breast, AJCC 8th Edition - Clinical: G2, ER+, PR+, HER2- - Signed by Earlie Server, MD on 04/05/2018 - Pathologic stage from 04/04/2018: No Stage Recommended (ypT3, pN2, cM0, G3, ER+, PR-, HER2-) - Signed by Earlie Server, MD on 04/04/2018  Breast cancer with isolated bone metastasis, stage IV #Initially stage IIIA  right breast cancer, ER positive, HER-2 negative status post bilateral oophorectomy, Right mastectomy and right axillary lymph node dissection, status post implant, and implant removal.  Status post left mastectomy and axillary sentinel lymph node biopsy. developed stage IV disease with biopsy-proven thoracic spine bone metastasis. Estrogen positive, HER-2 positive breast cancer. Status post radiation to T7 bone metastasis.    Recommend to switch to fulvestrant injections.  Rationale and potential side effects were discussed with patient. Recommend to start trastuzumab and Pertuzumab treatments.  She does not have significant tumor burden at this point and already has chemotherapy-induced neuropathy, I will hold off Taxol for now.   #Radiation induced esophagitis, improved.  Continue PPI, sucralfate,Dysphagia, recommend Magic mouthwash with lidocaine, swish and swallow  #Neoplasm related pain, continue current pain regimen.  Prescription was reviewed.  #Bone metastasis, labs are reviewed and discussed with patient.  Continue calcium and vitamin D supplementation.  Proceed with Zometa today.    #Chemotherapy-induced neuropathy, continue Lyrica and nortriptyline.  #Depression.  Continue Lexapro 20 mg daily. #Mediport has been removed.   use patient  peripheral IV line for now.   Follow-up 2 weeks for fulvestrant injection.  Follow-up in 3 weeks for the next cycle of trastuzumab and Pertuzumab treatments.  Patient knows to call clinic if she experiences concerning side effects  Earlie Server, MD, PhD Hematology Oncology [place] [facility] 09/17/20

## 2020-09-17 NOTE — Progress Notes (Signed)
Recurrent breast cancer on extended IV therapy. Central access requested by medical oncology.  Side to be determined the day of procedure.

## 2020-09-18 ENCOUNTER — Other Ambulatory Visit: Payer: Self-pay | Admitting: *Deleted

## 2020-09-18 MED ORDER — OXYCODONE HCL 5 MG PO TABS: 5.0000 mg | ORAL_TABLET | Freq: Four times a day (QID) | ORAL | 0 refills | Status: DC | PRN

## 2020-09-23 ENCOUNTER — Other Ambulatory Visit: Payer: Self-pay

## 2020-09-23 ENCOUNTER — Encounter
Admission: RE | Admit: 2020-09-23 | Discharge: 2020-09-23 | Disposition: A | Discharge status: Home / Self Care | Payer: Medicare HMO | Source: Ambulatory Visit | Attending: General Surgery | Admitting: General Surgery

## 2020-09-23 NOTE — Patient Instructions (Signed)
Your procedure is scheduled on: Monday September 28, 2020. Report to Day Surgery inside [facility] 2nd floor. To find out your arrival time please call [phone] between 1PM - 3PM on Friday September 23, 2020.  Remember: Instructions that are not followed completely may result in serious medical risk,  up to and including death, or upon the discretion of your surgeon and anesthesiologist your  surgery may need to be rescheduled.     _X__ 1. Do not eat food after midnight the night before your procedure.                 No chewing gum or hard candies. You may drink clear liquids up to 2 hours                 before you are scheduled to arrive for your surgery- DO not drink clear                 liquids within 2 hours of the start of your surgery.                 Clear Liquids include:  water, apple juice without pulp, clear Gatorade, G2 or                  Gatorade Zero (avoid Red/Purple/Blue), Black Coffee or Tea (Do not add                 anything to coffee or tea).  __X__2.  On the morning of surgery brush your teeth with toothpaste and water, you                may rinse your mouth with mouthwash if you wish.  Do not swallow any toothpaste of mouthwash.     _X__ 3.  No Alcohol for 24 hours before or after surgery.   _X__ 4.  Do Not Smoke or use e-cigarettes For 24 Hours Prior to Your Surgery.                 Do not use any chewable tobacco products for at least 6 hours prior to                 Surgery.  _X__  5.  Do not use any recreational drugs (marijuana, cocaine, heroin, ecstasy, MDMA or other)                For at least one week prior to your surgery.  Combination of these drugs with anesthesia                May have life threatening results.  __x__ 6.  Notify your doctor if there is any change in your medical condition      (cold, fever, infections).     Do not wear jewelry, make-up, hairpins, clips or nail polish. Do not wear lotions, powders,  or perfumes. You may wear deodorant. Do not shave 48 hours prior to surgery. Men may shave face and neck. Do not bring valuables to the hospital.    [facility] is not responsible for any belongings or valuables.  Contacts, dentures or bridgework may not be worn into surgery. Leave your suitcase in the car. After surgery it may be brought to your room. For patients admitted to the hospital, discharge time is determined by your treatment team.   Patients discharged the day of surgery will not be allowed to drive home.   Make arrangements for someone to be  with you for the first 24 hours of your Same Day Discharge.  __x__ Take these medicines the morning of surgery with A SIP OF WATER:    1. esomeprazole (NEXIUM) 40 MG  2. metoprolol succinate (TOPROL-XL) 25 MG   3. phentermine  4.pregabalin (LYRICA) 150 MG  5.escitalopram (LEXAPRO) 20 MG    __x__ Use CHG Soap (or wipes) as directed  ____ Use Benzoyl Peroxide Gel as instructed  __x__ Use inhalers on the day of surgery    albuterol (VENTOLIN HFA) 108 (90 Base) MCG/ACT inhaler   ____ Stop metformin 2 days prior to surgery    ____ Take 1/2 of usual insulin dose the night before surgery. No insulin the morning          of surgery.   ____ Stop Coumadin/Plavix/aspirin   __x__ Stop Anti-inflammatories such as Ibuprofen, Aleve, Advil, naproxen, aspirin and or BC powders.   __x__ Stop supplements until after surgery.    __x__ Do not start any herbal supplements before your procedure.     If you have any questions regarding your pre-procedure instructions,  Please call Pre-admit Testing at [phone].

## 2020-09-24 ENCOUNTER — Other Ambulatory Visit
Admission: RE | Admit: 2020-09-24 | Discharge: 2020-09-24 | Disposition: A | Discharge status: Home / Self Care | Payer: Medicare HMO | Source: Ambulatory Visit | Attending: General Surgery | Admitting: General Surgery

## 2020-09-24 DIAGNOSIS — Z20822 Contact with and (suspected) exposure to covid-19: Secondary | ICD-10-CM | POA: Insufficient documentation

## 2020-09-24 DIAGNOSIS — Z01812 Encounter for preprocedural laboratory examination: Secondary | ICD-10-CM | POA: Diagnosis present

## 2020-09-24 LAB — SARS CORONAVIRUS 2 (TAT 6-24 HRS): SARS Coronavirus 2: NEGATIVE

## 2020-09-28 ENCOUNTER — Ambulatory Visit: Payer: Medicare HMO | Admitting: Certified Registered"

## 2020-09-28 ENCOUNTER — Ambulatory Visit: Payer: Medicare HMO

## 2020-09-28 ENCOUNTER — Encounter
Admission: RE | Disposition: A | Discharge status: Home / Self Care | Payer: Self-pay | Source: Home / Self Care | Attending: General Surgery

## 2020-09-28 ENCOUNTER — Ambulatory Visit
Admission: RE | Admit: 2020-09-28 | Discharge: 2020-09-28 | Disposition: A | Discharge status: Home / Self Care | Payer: Medicare HMO | Attending: General Surgery | Admitting: General Surgery

## 2020-09-28 ENCOUNTER — Other Ambulatory Visit: Payer: Self-pay

## 2020-09-28 ENCOUNTER — Encounter: Payer: Self-pay | Admitting: General Surgery

## 2020-09-28 DIAGNOSIS — Z9049 Acquired absence of other specified parts of digestive tract: Secondary | ICD-10-CM | POA: Diagnosis not present

## 2020-09-28 DIAGNOSIS — C50411 Malignant neoplasm of upper-outer quadrant of right female breast: Secondary | ICD-10-CM | POA: Insufficient documentation

## 2020-09-28 DIAGNOSIS — D649 Anemia, unspecified: Secondary | ICD-10-CM | POA: Diagnosis not present

## 2020-09-28 DIAGNOSIS — M1990 Unspecified osteoarthritis, unspecified site: Secondary | ICD-10-CM | POA: Diagnosis not present

## 2020-09-28 DIAGNOSIS — I429 Cardiomyopathy, unspecified: Secondary | ICD-10-CM | POA: Insufficient documentation

## 2020-09-28 DIAGNOSIS — G43909 Migraine, unspecified, not intractable, without status migrainosus: Secondary | ICD-10-CM | POA: Insufficient documentation

## 2020-09-28 DIAGNOSIS — Z7952 Long term (current) use of systemic steroids: Secondary | ICD-10-CM | POA: Diagnosis not present

## 2020-09-28 DIAGNOSIS — Z170 Estrogen receptor positive status [ER+]: Secondary | ICD-10-CM | POA: Insufficient documentation

## 2020-09-28 DIAGNOSIS — F17210 Nicotine dependence, cigarettes, uncomplicated: Secondary | ICD-10-CM | POA: Insufficient documentation

## 2020-09-28 DIAGNOSIS — R011 Cardiac murmur, unspecified: Secondary | ICD-10-CM | POA: Insufficient documentation

## 2020-09-28 DIAGNOSIS — Z9221 Personal history of antineoplastic chemotherapy: Secondary | ICD-10-CM | POA: Insufficient documentation

## 2020-09-28 DIAGNOSIS — Z90722 Acquired absence of ovaries, bilateral: Secondary | ICD-10-CM | POA: Insufficient documentation

## 2020-09-28 DIAGNOSIS — C7951 Secondary malignant neoplasm of bone: Secondary | ICD-10-CM | POA: Diagnosis not present

## 2020-09-28 DIAGNOSIS — C50919 Malignant neoplasm of unspecified site of unspecified female breast: Secondary | ICD-10-CM | POA: Diagnosis present

## 2020-09-28 DIAGNOSIS — Z791 Long term (current) use of non-steroidal anti-inflammatories (NSAID): Secondary | ICD-10-CM | POA: Insufficient documentation

## 2020-09-28 DIAGNOSIS — K219 Gastro-esophageal reflux disease without esophagitis: Secondary | ICD-10-CM | POA: Insufficient documentation

## 2020-09-28 DIAGNOSIS — J449 Chronic obstructive pulmonary disease, unspecified: Secondary | ICD-10-CM | POA: Diagnosis not present

## 2020-09-28 DIAGNOSIS — F32A Depression, unspecified: Secondary | ICD-10-CM | POA: Diagnosis not present

## 2020-09-28 DIAGNOSIS — Z79899 Other long term (current) drug therapy: Secondary | ICD-10-CM | POA: Insufficient documentation

## 2020-09-28 DIAGNOSIS — Z95828 Presence of other vascular implants and grafts: Secondary | ICD-10-CM

## 2020-09-28 HISTORY — PX: PORTACATH PLACEMENT: SHX2246

## 2020-09-28 IMAGING — DX DG CHEST 1V PORT
1 series · 1 of 1 positions shown · non-contrast
Comparison: [DATE] chest radiograph and prior.

CLINICAL DATA: Status post Port-A-Cath insertion.

EXAM:
PORTABLE CHEST 1 VIEW

[chest ap]
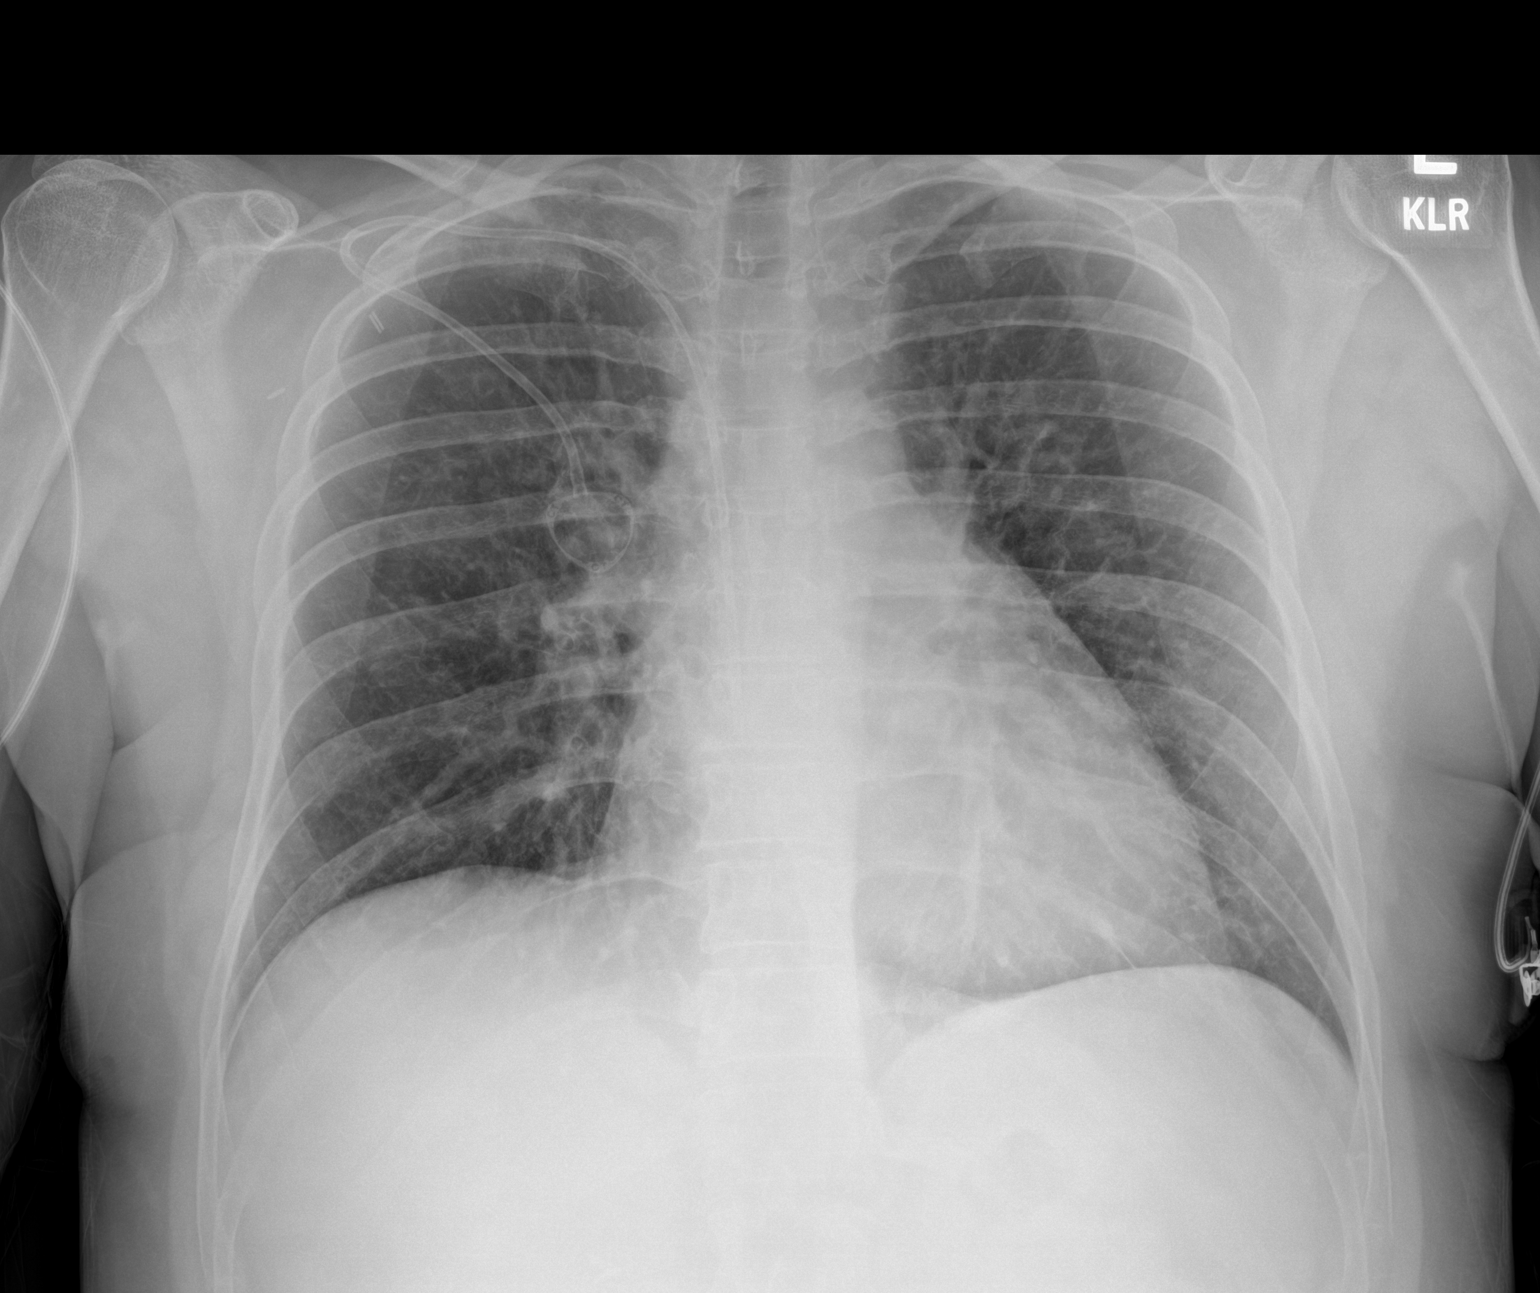

[1 of 1 positions shown; findings below may reference images not displayed]

FINDINGS: Right chest wall Port-A-Cath tip overlies the upper atrium. No focal
consolidation, pneumothorax or pleural effusion. No pulmonary edema.
Prominence of the cardiomediastinal silhouette likely secondary to
mild hypoinflation and AP technique. No acute osseous abnormality.
IMPRESSION: Indwelling right chest wall Port-A-Cath.  No acute airspace disease.

## 2020-09-28 SURGERY — INSERTION, TUNNELED CENTRAL VENOUS DEVICE, WITH PORT
Anesthesia: General | Site: Chest | Laterality: Right

## 2020-09-28 MED ORDER — FENTANYL CITRATE (PF) 100 MCG/2ML IJ SOLN
INTRAMUSCULAR | Status: DC | PRN
  Administered 2020-09-28: 50 ug via INTRAVENOUS

## 2020-09-28 MED ORDER — PROPOFOL 500 MG/50ML IV EMUL
INTRAVENOUS | Status: DC | PRN
  Administered 2020-09-28: 80 ug/kg/min via INTRAVENOUS

## 2020-09-28 MED ORDER — LIDOCAINE HCL (PF) 1 % IJ SOLN
INTRAMUSCULAR | Status: AC
  Filled 2020-09-28: qty 30

## 2020-09-28 MED ORDER — PROPOFOL 10 MG/ML IV BOLUS
INTRAVENOUS | Status: AC
  Filled 2020-09-28: qty 20

## 2020-09-28 MED ORDER — CHLORHEXIDINE GLUCONATE 0.12 % MT SOLN
OROMUCOSAL | Status: AC
  Administered 2020-09-28: 15 mL via OROMUCOSAL
  Filled 2020-09-28: qty 15

## 2020-09-28 MED ORDER — FENTANYL CITRATE (PF) 100 MCG/2ML IJ SOLN: 25.0000 ug | INTRAMUSCULAR | Status: DC | PRN

## 2020-09-28 MED ORDER — LIDOCAINE HCL (PF) 1 % IJ SOLN
INTRAMUSCULAR | Status: DC | PRN
  Administered 2020-09-28: 10 mL

## 2020-09-28 MED ORDER — LACTATED RINGERS IV SOLN: INTRAVENOUS | Status: DC

## 2020-09-28 MED ORDER — ORAL CARE MOUTH RINSE: 15.0000 mL | Freq: Once | OROMUCOSAL | Status: AC

## 2020-09-28 MED ORDER — FENTANYL CITRATE (PF) 100 MCG/2ML IJ SOLN
INTRAMUSCULAR | Status: AC
  Filled 2020-09-28: qty 2

## 2020-09-28 MED ORDER — DEXMEDETOMIDINE HCL IN NACL 200 MCG/50ML IV SOLN
INTRAVENOUS | Status: DC | PRN
  Administered 2020-09-28: 20 ug via INTRAVENOUS

## 2020-09-28 MED ORDER — DEXMEDETOMIDINE (PRECEDEX) IN NS 20 MCG/5ML (4 MCG/ML) IV SYRINGE
PREFILLED_SYRINGE | INTRAVENOUS | Status: AC
  Filled 2020-09-28: qty 5

## 2020-09-28 MED ORDER — ONDANSETRON HCL 4 MG/2ML IJ SOLN: 4.0000 mg | Freq: Once | INTRAMUSCULAR | Status: DC | PRN

## 2020-09-28 MED ORDER — SODIUM CHLORIDE (PF) 0.9 % IJ SOLN
INTRAMUSCULAR | Status: DC | PRN
  Administered 2020-09-28: 10 mL via INTRAVENOUS

## 2020-09-28 MED ORDER — LIDOCAINE HCL (PF) 2 % IJ SOLN
INTRAMUSCULAR | Status: AC
  Filled 2020-09-28: qty 5

## 2020-09-28 MED ORDER — DEXAMETHASONE SODIUM PHOSPHATE 10 MG/ML IJ SOLN
INTRAMUSCULAR | Status: DC | PRN
  Administered 2020-09-28: 10 mg via INTRAVENOUS

## 2020-09-28 MED ORDER — ACETAMINOPHEN 10 MG/ML IV SOLN
INTRAVENOUS | Status: DC | PRN
  Administered 2020-09-28: 1000 mg via INTRAVENOUS

## 2020-09-28 MED ORDER — MIDAZOLAM HCL 2 MG/2ML IJ SOLN
INTRAMUSCULAR | Status: DC | PRN
  Administered 2020-09-28: 2 mg via INTRAVENOUS

## 2020-09-28 MED ORDER — CEFAZOLIN SODIUM-DEXTROSE 2-4 GM/100ML-% IV SOLN
2.0000 g | INTRAVENOUS | Status: AC
  Administered 2020-09-28: 2 g via INTRAVENOUS

## 2020-09-28 MED ORDER — CHLORHEXIDINE GLUCONATE 0.12 % MT SOLN: 15.0000 mL | Freq: Once | OROMUCOSAL | Status: AC

## 2020-09-28 MED ORDER — MIDAZOLAM HCL 2 MG/2ML IJ SOLN
INTRAMUSCULAR | Status: AC
  Filled 2020-09-28: qty 2

## 2020-09-28 MED ORDER — CEFAZOLIN SODIUM-DEXTROSE 2-4 GM/100ML-% IV SOLN
INTRAVENOUS | Status: AC
  Filled 2020-09-28: qty 100

## 2020-09-28 MED ORDER — PROPOFOL 10 MG/ML IV BOLUS
INTRAVENOUS | Status: DC | PRN
  Administered 2020-09-28: 10 mg via INTRAVENOUS

## 2020-09-28 MED ORDER — SODIUM CHLORIDE (PF) 0.9 % IJ SOLN
INTRAMUSCULAR | Status: AC
  Filled 2020-09-28: qty 50

## 2020-09-28 MED ORDER — ACETAMINOPHEN 10 MG/ML IV SOLN
INTRAVENOUS | Status: AC
  Filled 2020-09-28: qty 100

## 2020-09-28 SURGICAL SUPPLY — 32 items
APL PRP STRL LF DISP 70% ISPRP (MISCELLANEOUS) ×1
BAG DECANTER FOR FLEXI CONT (MISCELLANEOUS) ×2 IMPLANT
BLADE SURG 15 STRL SS SAFETY (BLADE) ×2 IMPLANT
CHLORAPREP W/TINT 26 (MISCELLANEOUS) ×2 IMPLANT
COVER LIGHT HANDLE STERIS (MISCELLANEOUS) ×4 IMPLANT
COVER WAND RF STERILE (DRAPES) ×2 IMPLANT
DECANTER SPIKE VIAL GLASS SM (MISCELLANEOUS) ×4 IMPLANT
DRAPE C-ARM XRAY 36X54 (DRAPES) ×2 IMPLANT
DRAPE LAPAROTOMY TRNSV 106X77 (MISCELLANEOUS) ×2 IMPLANT
DRSG TEGADERM 2-3/8X2-3/4 SM (GAUZE/BANDAGES/DRESSINGS) ×2 IMPLANT
DRSG TEGADERM 4X4.75 (GAUZE/BANDAGES/DRESSINGS) ×2 IMPLANT
DRSG TELFA 4X3 1S NADH ST (GAUZE/BANDAGES/DRESSINGS) ×2 IMPLANT
ELECT REM PT RETURN 9FT ADLT (ELECTROSURGICAL) ×2
ELECTRODE REM PT RTRN 9FT ADLT (ELECTROSURGICAL) ×1 IMPLANT
GLOVE BIO SURGEON STRL SZ7.5 (GLOVE) ×2 IMPLANT
GLOVE INDICATOR 8.0 STRL GRN (GLOVE) ×2 IMPLANT
GOWN STRL REUS W/ TWL LRG LVL3 (GOWN DISPOSABLE) ×2 IMPLANT
GOWN STRL REUS W/TWL LRG LVL3 (GOWN DISPOSABLE) ×4
IV NS 500ML (IV SOLUTION) ×2
IV NS 500ML BAXH (IV SOLUTION) ×1 IMPLANT
KIT PORT POWER 8FR ISP CVUE (Port) ×2 IMPLANT
KIT TURNOVER KIT A (KITS) ×2 IMPLANT
LABEL OR SOLS (LABEL) ×2 IMPLANT
PACK PORT-A-CATH (MISCELLANEOUS) ×2 IMPLANT
STRIP CLOSURE SKIN 1/2X4 (GAUZE/BANDAGES/DRESSINGS) ×2 IMPLANT
SUT PROLENE 3 0 SH DA (SUTURE) ×2 IMPLANT
SUT VIC AB 3-0 SH 27 (SUTURE) ×2
SUT VIC AB 3-0 SH 27X BRD (SUTURE) ×1 IMPLANT
SUT VIC AB 4-0 FS2 27 (SUTURE) ×2 IMPLANT
SWABSTK COMLB BENZOIN TINCTURE (MISCELLANEOUS) ×2 IMPLANT
SYR 10ML LL (SYRINGE) ×2 IMPLANT
SYR 10ML SLIP (SYRINGE) ×2 IMPLANT

## 2020-09-28 NOTE — Transfer of Care (Signed)
Immediate Anesthesia Transfer of Care Note  Patient: Theresa Chaney  Procedure(s) Performed: Procedure(s): INSERTION PORT-A-CATH (Right)  Patient Location: PACU  Anesthesia Type:General  Level of Consciousness: sedated  Airway & Oxygen Therapy: Patient Spontanous Breathing and Patient connected to face mask oxygen  Post-op Assessment: Report given to RN and Post -op Vital signs reviewed and stable  Post vital signs: Reviewed and stable  Last Vitals:  Vitals:   09/28/20 1240 09/28/20 1525  BP: 117/73 109/63  Pulse: 98 77  Resp: 16 11  Temp: (!) 36.1 C (!) 36.2 C  SpO2: 11% 216%    Complications: No apparent anesthesia complications

## 2020-09-28 NOTE — Progress Notes (Signed)
Dr. Bary Castilla notified that pt was ready to go home. Per Dr. Bary Castilla pt could leave without waiting to see him.

## 2020-09-28 NOTE — Discharge Instructions (Signed)

## 2020-09-28 NOTE — H&P (Signed)
Theresa Chaney [id number] [birth year]     HPI:  39 y/o with metastatic breast cancer. Central venous access.  U/S showed left subclavian vein occluded. For right power port placement.    Medications Prior to Admission  Medication Sig Dispense Refill Last Dose  . albuterol (VENTOLIN HFA) 108 (90 Base) MCG/ACT inhaler Inhale 2 puffs into the lungs every 6 (six) hours as needed for wheezing or shortness of breath.    09/28/2020 at Unknown time  . Calcium Carbonate-Vitamin D3 (CALCIUM 600/VITAMIN D) 600-400 MG-UNIT TABS Take 2 tablets by mouth daily. 180 tablet 0 Past Week at Unknown time  . cholecalciferol (VITAMIN D3) 25 MCG (1000 UNIT) tablet Take 1 tablet (1,000 Units total) by mouth daily. 90 tablet 0 Past Week at Unknown time  . cyclobenzaprine (FLEXERIL) 5 MG tablet Take 5 mg by mouth 3 (three) times daily as needed for muscle spasms.    Past Month at Unknown time  . dexamethasone (DECADRON) 4 MG tablet Take 1 tablet (4 mg total) by mouth daily. 20 tablet 0 09/15/2020  . escitalopram (LEXAPRO) 20 MG tablet Take 20 mg by mouth daily.    09/28/2020 at Unknown time  . esomeprazole (NEXIUM) 40 MG capsule Take 40 mg by mouth daily before breakfast.    09/28/2020 at Unknown time  . Eszopiclone 3 MG TABS Take 3 mg by mouth at bedtime as needed (sleep).    09/27/2020 at Unknown time  . folic acid (FOLVITE) 1 MG tablet Take 1 mg by mouth daily.   Past Week at Unknown time  . ibuprofen (ADVIL) 800 MG tablet Take 800 mg by mouth every 8 (eight) hours as needed for moderate pain.    09/14/2020  . loperamide (IMODIUM A-D) 2 MG tablet Take 1 tablet (2 mg total) by mouth See admin instructions. Take 2 tablets with onset of diarrhea, then take 1 tablet every 2 hours until diarrhea stops. Maximum 8 tablets in 24 hours 30 tablet 0 09/26/2020  . loratadine (CLARITIN) 10 MG tablet Take 10 mg by mouth daily.    09/27/2020 at Unknown time  . LORazepam (ATIVAN) 1 MG tablet Take 1 mg by mouth 3 (three) times daily.    09/27/2020 at Unknown time  . magic mouthwash SOLN Take 5 mLs by mouth 4 (four) times daily. (Patient taking differently: Take 5 mLs by mouth 4 (four) times daily as needed (esophagus pain). ) 480 mL 1 Past Week at Unknown time  . Magnesium 400 MG TABS Take 400 mg by mouth 2 (two) times daily.   Past Week at Unknown time  . meloxicam (MOBIC) 7.5 MG tablet Take 7.5 mg by mouth 2 (two) times daily as needed (pain/inflammation.).    Past Month at Unknown time  . methotrexate (RHEUMATREX) 2.5 MG tablet Take 20 mg by mouth every Sunday.    09/27/2020 at Unknown time  . metoprolol succinate (TOPROL-XL) 25 MG 24 hr tablet Take 37.5 mg by mouth every morning.    09/28/2020 at 0900  . nortriptyline (PAMELOR) 10 MG capsule Take 30 mg by mouth at bedtime.    09/27/2020 at Unknown time  . oxyCODONE ER (XTAMPZA ER) 13.5 MG C12A Take 13.5 mg by mouth every 12 (twelve) hours. 14 capsule 0 Past Week at Unknown time  . phentermine (ADIPEX-P) 37.5 MG tablet Take 37.5 mg by mouth daily before breakfast.    09/27/2020 at Unknown time  . pregabalin (LYRICA) 150 MG capsule Take 150 mg by mouth 2 (two) times daily.  09/28/2020 at Unknown time  . promethazine (PHENERGAN) 25 MG tablet Take 1 tablet (25 mg total) by mouth every 8 (eight) hours as needed for nausea or vomiting. 90 tablet 0 09/14/2020  . pyridOXINE (VITAMIN B-6) 100 MG tablet Take 100 mg by mouth daily.   Past Week at Unknown time  . sucralfate (CARAFATE) 1 g tablet Take 1 tablet (1 g total) by mouth 3 (three) times daily. Dissolve in 3-4 tbsp warm water, swish and swallow. 90 tablet 1 09/14/2020  . topiramate (TOPAMAX) 50 MG tablet Take 50 mg by mouth 2 (two) times daily.    09/27/2020 at Unknown time  . vitamin C (ASCORBIC ACID) 500 MG tablet Take 500 mg by mouth daily.   Past Week at Unknown time  . acetaminophen (TYLENOL) 500 MG tablet Take 500 mg by mouth every 6 (six) hours as needed.   09/14/2020  . cyanocobalamin (,VITAMIN B-12,) 1000 MCG/ML injection  Inject 1,000 mcg into the skin every 30 (thirty) days.   09/11/2020  . lidocaine (LIDODERM) 5 % Place 1 patch onto the skin every 12 (twelve) hours. Remove & Discard patch within 12 hours or as directed by MD (Patient not taking: Reported on 09/23/2020) 10 patch 0 Completed Course at Unknown time  . oxyCODONE (OXY IR/ROXICODONE) 5 MG immediate release tablet Take 1-2 tablets (5-10 mg total) by mouth every 6 (six) hours as needed for moderate pain or severe pain. 60 tablet 0 09/24/2020   No Known Allergies Past Medical History:  Diagnosis Date  . Anemia   . Arthritis   . BRCA negative 11/26/2017  . Breast cancer ([place]) 10/11/2017   Multifocal, ER positive, PR negative, HER-2 negative. ypT3 ypN2a 8.7 cm, 4/15 nodes  . Cardiomyopathy ([place])    a. 10/2017 Echo: EF 60-65%, no rwma, Gr1 DD, nl RV size/fxn; b. 04/2018 Echo: EF 55-60%, no rwma, Nl RV size/fxn; c. 08/2018 Echo: EF 45%, diff HK, ? HK of antsept wall. Gr1 DD. Mild MR. Mild LAE/RAE. Mod dil RV.   [name] Chronic bronchitis ([place]) 11/2017  . COPD (chronic obstructive pulmonary disease) ([place])    MILD PER CXR  . Depression   . Family history of cancer   . GERD (gastroesophageal reflux disease)   . Headache    MIGRAINES  . Heart murmur    ASYMPTOMATIC  . Personal history of chemotherapy    current for right breast ca   Past Surgical History:  Procedure Laterality Date  . AXILLARY LYMPH NODE DISSECTION Right 03/19/2018   Procedure: AXILLARY LYMPH NODE DISSECTION;  Surgeon: Robert Bellow, MD;  Location: [facility] ORS;  Service: General;  Laterality: Right;  . BREAST BIOPSY Right 10/11/2017   12:30 posterior coil clip invasive mammary carcinoma  . BREAST BIOPSY Right 10/11/2017   11:30 middle depth ribbon clip DCIS  . BREAST BIOPSY Right 10/11/2017   5:30 anterior depth x shape invasive ductal carcinoma  . BREAST IMPLANT REMOVAL Right 06/03/2019   Procedure: REMOVAL OF RIGHT BREAST IMPLANTS;  Surgeon: Wallace Going, DO;  Location: [facility]  ORS;  Service: Plastics;  Laterality: Right;  . BREAST RECONSTRUCTION WITH PLACEMENT OF TISSUE EXPANDER AND FLEX HD (ACELLULAR HYDRATED DERMIS) Right 03/19/2018   Procedure: BREAST RECONSTRUCTION WITH PLACEMENT OF TISSUE EXPANDER AND FLEX HD (ACELLULAR HYDRATED DERMIS);  Surgeon: Wallace Going, DO;  Location: [facility] ORS;  Service: Plastics;  Laterality: Right;  . CARPAL TUNNEL RELEASE Bilateral 2020  . CHOLECYSTECTOMY N/A 04/27/2020   Procedure: LAPAROSCOPIC CHOLECYSTECTOMY WITH INTRAOPERATIVE CHOLANGIOGRAM;  Surgeon: Robert Bellow, MD;  Location: [facility] ORS;  Service: General;  Laterality: N/A;  . ESOPHAGOGASTRODUODENOSCOPY (EGD) WITH PROPOFOL N/A 04/17/2020   Procedure: ESOPHAGOGASTRODUODENOSCOPY (EGD) WITH PROPOFOL;  Surgeon: Robert Bellow, MD;  Location: [facility];  Service: Endoscopy;  Laterality: N/A;  with biopsy  . LAPAROSCOPIC BILATERAL SALPINGO OOPHERECTOMY Bilateral 03/19/2018   Procedure: LAPAROSCOPIC BILATERAL SALPINGO OOPHORECTOMY;  Surgeon: Benjaman Kindler, MD;  Location: [facility];  Service: Gynecology;  Laterality: Bilateral;  . MASTECTOMY Right 03/2018  . MASTECTOMY W/ SENTINEL NODE BIOPSY Right 03/19/2018   Procedure: MASTECTOMY WITH SENTINEL LYMPH NODE BIOPSY;  Surgeon: Robert Bellow, MD;  Location: [facility];  Service: General;  Laterality: Right;  . PORT-A-CATH REMOVAL Left 06/03/2019   Procedure: REMOVAL PORT-A-CATH;  Surgeon: Robert Bellow, MD;  Location: [facility];  Service: General;  Laterality: Left;  . PORTACATH PLACEMENT Left 10/24/2017   Procedure: INSERTION PORT-A-CATH;  Surgeon: Robert Bellow, MD;  Location: [facility];  Service: General;  Laterality: Left;  . REMOVAL OF TISSUE EXPANDER AND PLACEMENT OF IMPLANT Right 07/20/2018   Procedure: REMOVAL OF RIGHT BREAST TISSUE EXPANDER AND PLACEMENT OF IMPLANT;  Surgeon: Wallace Going, DO;  Location: [place];  Service: Plastics;  Laterality: Right;  . SIMPLE MASTECTOMY WITH  AXILLARY SENTINEL NODE BIOPSY Left 06/03/2019   Procedure: SIMPLE MASTECTOMY LEFT;  Surgeon: Robert Bellow, MD;  Location: [facility];  Service: General;  Laterality: Left;   Social History   Socioeconomic History  . Marital status: Married    Spouse name: Not on file  . Number of children: Not on file  . Years of education: Not on file  . Highest education level: Not on file  Occupational History  . Occupation: Occupational psychologist    Comment: Therapist, art   Tobacco Use  . Smoking status: Current Every Day Smoker    Packs/day: 0.50    Years: 18.00    Pack years: 9.00    Types: Cigarettes    Start date: 06/21/2018  . Smokeless tobacco: Never Used  Vaping Use  . Vaping Use: Never used  Substance and Sexual Activity  . Alcohol use: No  . Drug use: No  . Sexual activity: Yes    Birth control/protection: Injection, Other-see comments    Comment: has had hysterectomy  Other Topics Concern  . Not on file  Social History Narrative   Lives at home with husband and daughter   Social Determinants of Health   Financial Resource Strain:   . Difficulty of Paying Living Expenses: Not on file  Food Insecurity:   . Worried About Charity fundraiser in the Last Year: Not on file  . Ran Out of Food in the Last Year: Not on file  Transportation Needs:   . Lack of Transportation (Medical): Not on file  . Lack of Transportation (Non-Medical): Not on file  Physical Activity:   . Days of Exercise per Week: Not on file  . Minutes of Exercise per Session: Not on file  Stress:   . Feeling of Stress : Not on file  Social Connections:   . Frequency of Communication with Friends and Family: Not on file  . Frequency of Social Gatherings with Friends and Family: Not on file  . Attends Religious Services: Not on file  . Active Member of Clubs or Organizations: Not on file  . Attends Archivist Meetings: Not on file  . Marital Status: Not on file  Intimate  Partner Violence:   . Fear of Current or Ex-Partner: Not on file  . Emotionally Abused: Not on file  . Physically Abused: Not on file  . Sexually Abused: Not on file   Social History   Social History Narrative   Lives at home with husband and daughter     ROS: Negative.     PE: HEENT: Negative. Lungs: Clear. Cardio: RR.   Assessment/Plan:  Proceed with planned right power port placement.    Forest Gleason Jung [place]ak 09/28/2020

## 2020-09-28 NOTE — Anesthesia Preprocedure Evaluation (Addendum)
Anesthesia Evaluation  Patient identified by MRN, date of birth, ID band Patient awake    Reviewed: Allergy & Precautions, H&P , NPO status , Patient's Chart, lab work & pertinent test results  History of Anesthesia Complications Negative for: history of anesthetic complications  Airway Mallampati: I  TM Distance: >3 FB     Dental  (+) Chipped   Pulmonary neg sleep apnea, COPD, Current SmokerPatient did not abstain from smoking.,    breath sounds clear to auscultation       Cardiovascular (-) angina(-) Past MI and (-) Cardiac Stents (-) dysrhythmias  Rhythm:regular Rate:Normal  1. Left ventricular ejection fraction, by estimation, is 60 to 65%. Left  ventricular ejection fraction by PLAX is 68 %. The left ventricle has  normal function. Left ventricular diastolic parameters are consistent with  Grade I diastolic dysfunction  (impaired relaxation).  2. Right ventricular systolic function is normal. The right ventricular  size is normal.  3. The mitral valve was not well visualized. No evidence of mitral valve  regurgitation.  4. Aortic valve regurgitation is not visualized.    Neuro/Psych  Headaches, PSYCHIATRIC DISORDERS Anxiety Depression    GI/Hepatic Neg liver ROS, GERD  Controlled,  Endo/Other  negative endocrine ROS  Renal/GU negative Renal ROS  negative genitourinary   Musculoskeletal   Abdominal   Peds  Hematology negative hematology ROS (+)   Anesthesia Other Findings Past Medical History: No date: Anemia No date: Arthritis 11/26/2017: BRCA negative 10/11/2017: Breast cancer ([place])     Comment:  Multifocal, ER positive, PR negative, HER-2 negative.               ypT3 ypN2a 8.7 cm, 4/15 nodes No date: Cardiomyopathy [facility])     Comment:  a. 10/2017 Echo: EF 60-65%, no rwma, Gr1 DD, nl RV               size/fxn; b. 04/2018 Echo: EF 55-60%, no rwma, Nl RV               size/fxn; c. 08/2018 Echo: EF 45%,  diff HK, ? HK of               antsept wall. Gr1 DD. Mild MR. Mild LAE/RAE. Mod dil RV.  11/2017: Chronic bronchitis ([place]) No date: COPD (chronic obstructive pulmonary disease) ([facility])     Comment:  MILD PER CXR No date: Depression No date: Family history of cancer No date: GERD (gastroesophageal reflux disease) No date: Headache     Comment:  MIGRAINES No date: Heart murmur     Comment:  ASYMPTOMATIC No date: Personal history of chemotherapy     Comment:  current for right breast ca  Past Surgical History: 03/19/2018: AXILLARY LYMPH NODE DISSECTION; Right     Comment:  Procedure: AXILLARY LYMPH NODE DISSECTION;  Surgeon:               Robert Bellow, MD;  Location: [facility];  Service:               General;  Laterality: Right; 10/11/2017: BREAST BIOPSY; Right     Comment:  12:30 posterior coil clip invasive mammary carcinoma 10/11/2017: BREAST BIOPSY; Right     Comment:  11:30 middle depth ribbon clip DCIS 10/11/2017: BREAST BIOPSY; Right     Comment:  5:30 anterior depth x shape invasive ductal carcinoma 06/03/2019: BREAST IMPLANT REMOVAL; Right     Comment:  Procedure: REMOVAL OF RIGHT BREAST IMPLANTS;  Surgeon:  Dillingham, Loel Lofty, DO;  Location: [facility];  Service:               Plastics;  Laterality: Right; 03/19/2018: BREAST RECONSTRUCTION WITH PLACEMENT OF TISSUE EXPANDER AND  FLEX HD (ACELLULAR HYDRATED DERMIS); Right     Comment:  Procedure: BREAST RECONSTRUCTION WITH PLACEMENT OF               TISSUE EXPANDER AND FLEX HD (ACELLULAR HYDRATED DERMIS);               Surgeon: Wallace Going, DO;  Location: [facility];                Service: Plastics;  Laterality: Right; 2020: CARPAL TUNNEL RELEASE; Bilateral 04/27/2020: CHOLECYSTECTOMY; N/A     Comment:  Procedure: LAPAROSCOPIC CHOLECYSTECTOMY WITH               INTRAOPERATIVE CHOLANGIOGRAM;  Surgeon: Robert Bellow, MD;  Location: [facility];  Service: General;                 Laterality: N/A; 04/17/2020: ESOPHAGOGASTRODUODENOSCOPY (EGD) WITH PROPOFOL; N/A     Comment:  Procedure: ESOPHAGOGASTRODUODENOSCOPY (EGD) WITH               PROPOFOL;  Surgeon: Robert Bellow, MD;  Location:               [facility];  Service: Endoscopy;  Laterality: N/A;                with biopsy 03/19/2018: LAPAROSCOPIC BILATERAL SALPINGO OOPHERECTOMY; Bilateral     Comment:  Procedure: LAPAROSCOPIC BILATERAL SALPINGO OOPHORECTOMY;              Surgeon: Benjaman Kindler, MD;  Location: [facility];                Service: Gynecology;  Laterality: Bilateral; 03/2018: MASTECTOMY; Right 03/19/2018: MASTECTOMY W/ SENTINEL NODE BIOPSY; Right     Comment:  Procedure: MASTECTOMY WITH SENTINEL LYMPH NODE BIOPSY;                Surgeon: Robert Bellow, MD;  Location: [facility];                Service: General;  Laterality: Right; 06/03/2019: PORT-A-CATH REMOVAL; Left     Comment:  Procedure: REMOVAL PORT-A-CATH;  Surgeon: Robert Bellow, MD;  Location: [facility];  Service: General;                Laterality: Left; 10/24/2017: PORTACATH PLACEMENT; Left     Comment:  Procedure: INSERTION PORT-A-CATH;  Surgeon: Robert Bellow, MD;  Location: [facility];  Service: General;                Laterality: Left; 07/20/2018: REMOVAL OF TISSUE EXPANDER AND PLACEMENT OF IMPLANT; Right     Comment:  Procedure: REMOVAL OF RIGHT BREAST TISSUE EXPANDER AND               PLACEMENT OF IMPLANT;  Surgeon: Wallace Going, DO;              Location: [place];  Service: Plastics;               Laterality:  Right; 06/03/2019: SIMPLE MASTECTOMY WITH AXILLARY SENTINEL NODE BIOPSY; Left     Comment:  Procedure: SIMPLE MASTECTOMY LEFT;  Surgeon: Robert Bellow, MD;  Location: [facility];  Service: General;                Laterality: Left;  BMI    Body Mass Index: 30.54 kg/m      Reproductive/Obstetrics negative OB ROS                             Anesthesia Physical Anesthesia Plan  ASA: II  Anesthesia Plan: General   Post-op Pain Management:    Induction:   PONV Risk Score and Plan: Propofol infusion and TIVA  Airway Management Planned: Nasal Cannula  Additional Equipment:   Intra-op Plan:   Post-operative Plan:   Informed Consent: I have reviewed the patients History and Physical, chart, labs and discussed the procedure including the risks, benefits and alternatives for the proposed anesthesia with the patient or authorized representative who has indicated his/her understanding and acceptance.     Dental Advisory Given  Plan Discussed with: Anesthesiologist, CRNA and Surgeon  Anesthesia Plan Comments:         Anesthesia Quick Evaluation

## 2020-09-28 NOTE — Op Note (Signed)
Preoperative diagnosis: Metastatic breast cancer, need for central venous access.  Postoperative diagnosis: Same.  Operative procedure: Right subclavian PowerPort placement with ultrasound and fluoroscopic guidance.  Operating surgeon: Hervey Ard, MD.  Anesthesia: Attended local, 10 cc 1% plain Xylocaine.  Estimated blood loss: Minimal.  Clinical note: This 39 year old woman is developed metastatic breast cancer and is felt to be a candidate for adjuvant chemotherapy.  Central venous access was requested by her treating oncologist.  The patient had a previous left subclavian PowerPort.  Ultrasound evaluation prior to presentation of the operating theater showed the vein to be significantly scarred and without respiratory variation.  The right subclavian vein however was felt to be normal.  With the patient under adequate sedation the area was cleansed with ChloraPrep and draped.  Ultrasound was used to identify the right subclavian vein and local anesthesia was infiltrated.  The vein was cannulated under ultrasound guidance followed by guidewire and catheter placement.  The catheter was positioned at the distal SVC based on fluoroscopy.  It easily irrigated and aspirated.  It was tunneled to a pocket on the right anterior chest where the port was anchored to the deep fascia with interrupted 3-0 Prolene sutures x2.  The adipose layer was closed with a running 3-0 Vicryl.  The skin closed with a running 4-0 Vicryl subcuticular suture.  Benzoin and Steri-Strips followed by Telfa and Tegaderm dressings were applied.  The patient was transported to PACU in good condition.  Erect portable chest x-ray obtained after arrival in PACU showed the catheter tip as described above and no evidence of pneumothorax.

## 2020-09-29 ENCOUNTER — Encounter: Payer: Self-pay | Admitting: General Surgery

## 2020-09-29 NOTE — Anesthesia Postprocedure Evaluation (Signed)
Anesthesia Post Note  Patient: Theresa Chaney  Procedure(s) Performed: INSERTION PORT-A-CATH (Right Chest)  Patient location during evaluation: PACU Anesthesia Type: General Level of consciousness: awake and alert and oriented Pain management: pain level controlled Vital Signs Assessment: post-procedure vital signs reviewed and stable Respiratory status: spontaneous breathing Cardiovascular status: blood pressure returned to baseline Anesthetic complications: no   No complications documented.   Last Vitals:  Vitals:   09/28/20 1550 09/28/20 1557  BP: 119/81 116/77  Pulse: 84 86  Resp: 19 16  Temp: (!) 36.3 C (!) 36.3 C  SpO2: 100% 98%    Last Pain:  Vitals:   09/28/20 1557  TempSrc: Temporal  PainSc: 0-No pain                 Brae Gartman

## 2020-10-01 ENCOUNTER — Ambulatory Visit: Payer: Medicare HMO

## 2020-10-05 ENCOUNTER — Inpatient Hospital Stay: Payer: Medicare HMO

## 2020-10-05 ENCOUNTER — Encounter: Payer: Self-pay | Admitting: Radiation Oncology

## 2020-10-05 ENCOUNTER — Ambulatory Visit
Admission: RE | Admit: 2020-10-05 | Discharge: 2020-10-05 | Disposition: A | Discharge status: Home / Self Care | Payer: Medicare HMO | Source: Ambulatory Visit | Attending: Radiation Oncology | Admitting: Radiation Oncology

## 2020-10-05 ENCOUNTER — Other Ambulatory Visit: Payer: Self-pay

## 2020-10-05 VITALS — BP 118/83 | HR 72 | Temp 96.2°F | Wt 189.0 lb

## 2020-10-05 DIAGNOSIS — C50919 Malignant neoplasm of unspecified site of unspecified female breast: Secondary | ICD-10-CM

## 2020-10-05 DIAGNOSIS — Z5111 Encounter for antineoplastic chemotherapy: Secondary | ICD-10-CM | POA: Diagnosis not present

## 2020-10-05 DIAGNOSIS — Z170 Estrogen receptor positive status [ER+]: Secondary | ICD-10-CM

## 2020-10-05 DIAGNOSIS — C50411 Malignant neoplasm of upper-outer quadrant of right female breast: Secondary | ICD-10-CM

## 2020-10-05 MED ORDER — FULVESTRANT 250 MG/5ML IM SOLN
500.0000 mg | Freq: Once | INTRAMUSCULAR | Status: AC
  Administered 2020-10-05: 500 mg via INTRAMUSCULAR

## 2020-10-05 NOTE — Progress Notes (Signed)
Radiation Oncology Follow up Note  Name: Theresa Chaney   Date:   10/05/2020 MRN:  [id number] DOB: 1981/03/01    This 39 y.o. female presents to the clinic today for 1 month follow-up status post palliative radiation therapy to her T7 vertebral body for stage IV involvement of her breast cancer. REFERRING PROVIDER: Marguerita Merles, MD  HPI: Patient is a 39 year old female now at 1 month having completed palliative radiation therapy to her T7 vertebral body.  She had been previously treated for stage IIIa ER/PR positive invasive mammary carcinoma the right breast status post right modified radical mastectomy..  She is recently been started ontrastuzumab and Pertuzumab treatments which she is tolerating well.  She states the pain in her back is markedly improved.  She is having no other areas of pain at this time.  She has some dysphagia during treatments that has cleared although occasionally does still have some slight irritation on certain foods and I have suggested continuing the Carafate as much as possible over the next month or 2  COMPLICATIONS OF TREATMENT: none  FOLLOW UP COMPLIANCE: keeps appointments   PHYSICAL EXAM:  BP 118/83   Pulse 72   Temp (!) 96.2 F (35.7 C) (Tympanic)   Wt 189 lb (85.7 kg)   LMP  (LMP Unknown) Comment: Ovaries and tubes removed.April 2019  BMI 29.60 kg/m  The palpation of her spine does not elicit pain.  Motor or sensory details are equal symmetric upper lower extremities.  Well-developed well-nourished patient in NAD. HEENT reveals PERLA, EOMI, discs not visualized.  Oral cavity is clear. No oral mucosal lesions are identified. Neck is clear without evidence of cervical or supraclavicular adenopathy. Lungs are clear to A&P. Cardiac examination is essentially unremarkable with regular rate and rhythm without murmur rub or thrill. Abdomen is benign with no organomegaly or masses noted. Motor sensory and DTR levels are equal and symmetric in the upper and  lower extremities. Cranial nerves II through XII are grossly intact. Proprioception is intact. No peripheral adenopathy or edema is identified. No motor or sensory levels are noted. Crude visual fields are within normal range.  RADIOLOGY RESULTS: No current films to review  PLAN: Present time she has had excellent palliative benefit from radiation therapy.  She continues immunotherapy under the direction of medical oncology.  I am turning follow-up care over to them I would happy to reevaluate the patient in any time should further palliative treatment be indicated.  I would like to take this opportunity to thank you for allowing me to participate in the care of your patient.Noreene Filbert, MD

## 2020-10-06 ENCOUNTER — Other Ambulatory Visit: Payer: Self-pay

## 2020-10-06 ENCOUNTER — Encounter: Payer: Self-pay | Admitting: Oncology

## 2020-10-06 DIAGNOSIS — Z170 Estrogen receptor positive status [ER+]: Secondary | ICD-10-CM

## 2020-10-06 DIAGNOSIS — C50811 Malignant neoplasm of overlapping sites of right female breast: Secondary | ICD-10-CM

## 2020-10-06 MED ORDER — LIDOCAINE-PRILOCAINE 2.5-2.5 % EX CREA: TOPICAL_CREAM | CUTANEOUS | 3 refills | Status: DC

## 2020-10-07 ENCOUNTER — Other Ambulatory Visit: Payer: Self-pay

## 2020-10-07 DIAGNOSIS — Z170 Estrogen receptor positive status [ER+]: Secondary | ICD-10-CM

## 2020-10-07 DIAGNOSIS — C50411 Malignant neoplasm of upper-outer quadrant of right female breast: Secondary | ICD-10-CM

## 2020-10-08 ENCOUNTER — Inpatient Hospital Stay: Payer: Medicare HMO

## 2020-10-08 ENCOUNTER — Inpatient Hospital Stay (HOSPITAL_BASED_OUTPATIENT_CLINIC_OR_DEPARTMENT_OTHER): Payer: Medicare HMO | Admitting: Oncology

## 2020-10-08 ENCOUNTER — Other Ambulatory Visit: Payer: Self-pay

## 2020-10-08 ENCOUNTER — Encounter: Payer: Self-pay | Admitting: *Deleted

## 2020-10-08 ENCOUNTER — Encounter: Payer: Self-pay | Admitting: Oncology

## 2020-10-08 VITALS — BP 112/73 | HR 100 | Temp 97.4°F | Resp 16 | Wt 191.8 lb

## 2020-10-08 DIAGNOSIS — C50919 Malignant neoplasm of unspecified site of unspecified female breast: Secondary | ICD-10-CM

## 2020-10-08 DIAGNOSIS — G620 Drug-induced polyneuropathy: Secondary | ICD-10-CM

## 2020-10-08 DIAGNOSIS — C50811 Malignant neoplasm of overlapping sites of right female breast: Secondary | ICD-10-CM

## 2020-10-08 DIAGNOSIS — Z95828 Presence of other vascular implants and grafts: Secondary | ICD-10-CM | POA: Diagnosis not present

## 2020-10-08 DIAGNOSIS — Z170 Estrogen receptor positive status [ER+]: Secondary | ICD-10-CM

## 2020-10-08 DIAGNOSIS — C50411 Malignant neoplasm of upper-outer quadrant of right female breast: Secondary | ICD-10-CM | POA: Diagnosis not present

## 2020-10-08 DIAGNOSIS — Z5111 Encounter for antineoplastic chemotherapy: Secondary | ICD-10-CM

## 2020-10-08 DIAGNOSIS — T451X5A Adverse effect of antineoplastic and immunosuppressive drugs, initial encounter: Secondary | ICD-10-CM

## 2020-10-08 LAB — CBC WITH DIFFERENTIAL/PLATELET
Abs Immature Granulocytes: 0.02 10*3/uL (ref 0.00–0.07)
Basophils Absolute: 0 10*3/uL (ref 0.0–0.1)
Basophils Relative: 1 %
Eosinophils Absolute: 0.1 10*3/uL (ref 0.0–0.5)
Eosinophils Relative: 2 %
HCT: 32.6 % — ABNORMAL LOW (ref 36.0–46.0)
Hemoglobin: 11 g/dL — ABNORMAL LOW (ref 12.0–15.0)
Immature Granulocytes: 0 %
Lymphocytes Relative: 24 %
Lymphs Abs: 1.9 10*3/uL (ref 0.7–4.0)
MCH: 30.5 pg (ref 26.0–34.0)
MCHC: 33.7 g/dL (ref 30.0–36.0)
MCV: 90.3 fL (ref 80.0–100.0)
Monocytes Absolute: 0.7 10*3/uL (ref 0.1–1.0)
Monocytes Relative: 9 %
Neutro Abs: 4.9 10*3/uL (ref 1.7–7.7)
Neutrophils Relative %: 64 %
Platelets: 292 10*3/uL (ref 150–400)
RBC: 3.61 MIL/uL — ABNORMAL LOW (ref 3.87–5.11)
RDW: 14 % (ref 11.5–15.5)
WBC: 7.7 10*3/uL (ref 4.0–10.5)
nRBC: 0 % (ref 0.0–0.2)

## 2020-10-08 LAB — COMPREHENSIVE METABOLIC PANEL
ALT: 21 U/L (ref 0–44)
AST: 26 U/L (ref 15–41)
Albumin: 4.1 g/dL (ref 3.5–5.0)
Alkaline Phosphatase: 80 U/L (ref 38–126)
Anion gap: 7 (ref 5–15)
BUN: 16 mg/dL (ref 6–20)
CO2: 23 mmol/L (ref 22–32)
Calcium: 8.8 mg/dL — ABNORMAL LOW (ref 8.9–10.3)
Chloride: 108 mmol/L (ref 98–111)
Creatinine, Ser: 0.92 mg/dL (ref 0.44–1.00)
GFR, Estimated: >60 mL/min (ref >60)
Glucose, Bld: 114 mg/dL — ABNORMAL HIGH (ref 70–99)
Potassium: 3.5 mmol/L (ref 3.5–5.1)
Sodium: 138 mmol/L (ref 135–145)
Total Bilirubin: 0.5 mg/dL (ref 0.3–1.2)
Total Protein: 7.1 g/dL (ref 6.5–8.1)

## 2020-10-08 MED ORDER — DIPHENOXYLATE-ATROPINE 2.5-0.025 MG PO TABS: 1.0000 | ORAL_TABLET | Freq: Four times a day (QID) | ORAL | 0 refills | Status: DC | PRN

## 2020-10-08 MED ORDER — ACETAMINOPHEN 325 MG PO TABS
650.0000 mg | ORAL_TABLET | Freq: Once | ORAL | Status: AC
  Administered 2020-10-08: 650 mg via ORAL
  Filled 2020-10-08: qty 2

## 2020-10-08 MED ORDER — SODIUM CHLORIDE 0.9 % IV SOLN
Freq: Once | INTRAVENOUS | Status: AC
  Filled 2020-10-08: qty 250

## 2020-10-08 MED ORDER — HEPARIN SOD (PORK) LOCK FLUSH 100 UNIT/ML IV SOLN
500.0000 [IU] | Freq: Once | INTRAVENOUS | Status: AC
  Administered 2020-10-08: 500 [IU] via INTRAVENOUS
  Filled 2020-10-08: qty 5

## 2020-10-08 MED ORDER — DIPHENHYDRAMINE HCL 25 MG PO CAPS
50.0000 mg | ORAL_CAPSULE | Freq: Once | ORAL | Status: AC
  Administered 2020-10-08: 50 mg via ORAL
  Filled 2020-10-08: qty 2

## 2020-10-08 MED ORDER — SODIUM CHLORIDE 0.9% FLUSH
10.0000 mL | INTRAVENOUS | Status: DC | PRN
  Administered 2020-10-08: 10 mL via INTRAVENOUS
  Filled 2020-10-08: qty 10

## 2020-10-08 MED ORDER — HEPARIN SOD (PORK) LOCK FLUSH 100 UNIT/ML IV SOLN
INTRAVENOUS | Status: AC
  Filled 2020-10-08: qty 5

## 2020-10-08 MED ORDER — TRASTUZUMAB-ANNS CHEMO 150 MG IV SOLR
6.0000 mg/kg | Freq: Once | INTRAVENOUS | Status: AC
  Administered 2020-10-08: 504 mg via INTRAVENOUS
  Filled 2020-10-08: qty 24

## 2020-10-08 NOTE — Progress Notes (Signed)
Hematology/Oncology Follow Up Note [place] Telephone:(336) [phone] Fax:(336) [phone]  Patient Care Team: Marguerita Merles, MD as PCP - General (Family Medicine) End, Harrell Gave, MD as PCP - Cardiology (Cardiology) Rico Junker, RN as Oncology Nurse Navigator Earlie Server, MD as Consulting Physician (Oncology) Bary Castilla, Forest Gleason, MD (General Surgery) Noreene Filbert, MD as Referring Physician (Radiation Oncology) Noreene Filbert, MD as Radiation Oncologist (Radiation Oncology) Noreene Filbert, MD as Radiation Oncologist (Radiation Oncology)   Name of the patient: Theresa Chaney  [id number]  [birth year]   Date of visit: 10/08/20 REASON FOR VISIT Follow up for Assessment prior to chemotherapy treatment of breast cancer  Oncology History Cancer TREATMENT Neoadjuvant ddAC +one dose of Taxol, due to lack of response, surgery was offered. Case was discussed on breast tumor board. 03/19/2018 S/p right mastectomy and right axillary dissection, immediate breast reconstruction with placement of expanders.  ypT3 ypN2, + lymphovascular invasion,  Grade 3, margin is negative, close. ER 90%, PR 0%, HER2 IHC negative.  Also had elective bilateral salpingo-oophorectomy..  # 06/11/2018 s/p 11 cycles Taxol adjuvantly. Tolerated well.  # She has obtained dental clearance for starting Zometa.  S/p Zometa on 6/3/ 2019 # s/p adjuvant right chest wall radiation, finished 10/10/2018 #06/03/2019 underwent elective left prophylactic mastectomy and sentinel lymph node biopsy of left axilla.  # 06/03/2019 underwent elective left prophylactic mastectomy and sentinel lymph node biopsy of left axilla. Pathology negative for malignancy. Status post Mediport removal on 06/03/2019. She also underwent right implant removal on 06/03/2019. She also underwent right implant removal on 06/03/2019. #Negative genetic testing  #  chemotherapy-induced neuropathy, bilateral fingertips and lower extremities.   Patient is on Lyrica and nortriptyline.  Follows up with neurology. She follows up with lymphedema clinic.  #  07/07/2020, MRI thoracic spine without contrast showed lesions involving the T7 posterior elements most concerning for metastatic lesion.  No evidence of epidural tumor.  Minimal thoracic spondylosis without stenosis. MRI was reviewed by me and a PET scan was obtained for further evaluation. 07/20/2020, PET scan showed hypermetabolic metastasis involving the posterior element of T7, no additional evidence of metastasis in the neck, chest, abdomen or pelvis.  # 07/29/2020 T7 lesion biopsy showed metastatic carcinoma, compatible with breast origin.  Receptor status staining showed ER 71-80% positive, PR negative, HER-2 positive IHC 3+ # Patient finished spine radiation on 08/31/2020  INTERVAL HISTORY 39 yo female with above oncology history reviewed by me presents for follow-up of management of metastatic breast cancer.  Patient reports that back pain has improved.   Diarrhea 8-9 loose bowel movements since last visit. No abdominal pain Some joint pain after fulvestrant and zometa infusion.    Review of Systems  Constitutional: Negative for chills, fever, malaise/fatigue and weight loss.  HENT: Negative for sore throat.   Eyes: Negative for redness.  Respiratory: Negative for cough, shortness of breath and wheezing.   Cardiovascular: Negative for chest pain, palpitations and leg swelling.  Gastrointestinal: Negative for abdominal pain, blood in stool, heartburn, nausea and vomiting.  Genitourinary: Negative for dysuria.  Musculoskeletal: Positive for joint pain. Negative for back pain and myalgias.  Skin: Negative for rash.  Neurological: Positive for tingling. Negative for dizziness and tremors.  Endo/Heme/Allergies: Does not bruise/bleed easily.  Psychiatric/Behavioral: Negative for hallucinations.    No Known Allergies  Patient Active Problem List   Diagnosis Date Noted  .  Metastatic breast cancer ([place]) 08/31/2020  . Goals of care, counseling/discussion 08/11/2020  . Neuropathy due to chemotherapeutic  drug ([place]) 08/11/2020  . HER2-positive carcinoma of breast ([place]) 08/11/2020  . Rheumatoid arteritis ([place]) 10/13/2019  . Bilateral hand swelling 10/03/2019  . Fracture of neck of metacarpal bone 05/14/2019  . Chronic fatigue 04/09/2019  . Polyarthralgia 04/09/2019  . Status post right breast reconstruction 02/26/2019  . Status post right mastectomy 02/26/2019  . Mastalgia 02/15/2019  . Shortness of breath 08/23/2018  . Nonischemic cardiomyopathy ([place]) 08/23/2018  . Preprocedural cardiovascular examination 08/23/2018  . Tachycardia 08/23/2018  . Palpitations 08/23/2018  . Estrogen receptor positive status (ER+) 04/04/2018  . Acquired absence of right breast and nipple 04/03/2018  . Breast cancer of upper-outer quadrant of right female breast ([place]) 03/19/2018  . Family history of cancer   . Malignant neoplasm of overlapping sites of right breast in female, estrogen receptor positive ([place]) 10/19/2017  . Gastroesophageal reflux disease without esophagitis 02/24/2017  . Generalized anxiety disorder 10/03/2014  . Headache 10/03/2014     Past Medical History:  Diagnosis Date  . Anemia   . Arthritis   . BRCA negative 11/26/2017  . Breast cancer ([place]) 10/11/2017   Multifocal, ER positive, PR negative, HER-2 negative. ypT3 ypN2a 8.7 cm, 4/15 nodes  . Cardiomyopathy ([place])    a. 10/2017 Echo: EF 60-65%, no rwma, Gr1 DD, nl RV size/fxn; b. 04/2018 Echo: EF 55-60%, no rwma, Nl RV size/fxn; c. 08/2018 Echo: EF 45%, diff HK, ? HK of antsept wall. Gr1 DD. Mild MR. Mild LAE/RAE. Mod dil RV.   [name] Chronic bronchitis ([place]) 11/2017  . COPD (chronic obstructive pulmonary disease) ([place])    MILD PER CXR  . Depression   . Family history of cancer   . GERD (gastroesophageal reflux disease)   . Headache    MIGRAINES  . Heart murmur    ASYMPTOMATIC  . Personal history of  chemotherapy    current for right breast ca     Past Surgical History:  Procedure Laterality Date  . AXILLARY LYMPH NODE DISSECTION Right 03/19/2018   Procedure: AXILLARY LYMPH NODE DISSECTION;  Surgeon: Robert Bellow, MD;  Location: [facility] OR[facility];  Service: General;  Laterality: Right;  . BREAST BIOPSY Right 10/11/2017   12:30 posterior coil clip invasive mammary carcinoma  . BREAST BIOPSY Right 10/11/2017   11:30 middle depth ribbon clip DCIS  . BREAST BIOPSY Right 10/11/2017   5:30 anterior depth x shape invasive ductal carcinoma  . BREAST IMPLANT REMOVAL Right 06/03/2019   Procedure: REMOVAL OF RIGHT BREAST IMPLANTS;  Surgeon: Wallace Going, DO;  Location: [facility];  Service: Plastics;  Laterality: Right;  . BREAST RECONSTRUCTION WITH PLACEMENT OF TISSUE EXPANDER AND FLEX HD (ACELLULAR HYDRATED DERMIS) Right 03/19/2018   Procedure: BREAST RECONSTRUCTION WITH PLACEMENT OF TISSUE EXPANDER AND FLEX HD (ACELLULAR HYDRATED DERMIS);  Surgeon: Wallace Going, DO;  Location: [facility];  Service: Plastics;  Laterality: Right;  . CARPAL TUNNEL RELEASE Bilateral 2020  . CHOLECYSTECTOMY N/A 04/27/2020   Procedure: LAPAROSCOPIC CHOLECYSTECTOMY WITH INTRAOPERATIVE CHOLANGIOGRAM;  Surgeon: Robert Bellow, MD;  Location: [facility];  Service: General;  Laterality: N/A;  . ESOPHAGOGASTRODUODENOSCOPY (EGD) WITH PROPOFOL N/A 04/17/2020   Procedure: ESOPHAGOGASTRODUODENOSCOPY (EGD) WITH PROPOFOL;  Surgeon: Robert Bellow, MD;  Location: [facility];  Service: Endoscopy;  Laterality: N/A;  with biopsy  . LAPAROSCOPIC BILATERAL SALPINGO OOPHERECTOMY Bilateral 03/19/2018   Procedure: LAPAROSCOPIC BILATERAL SALPINGO OOPHORECTOMY;  Surgeon: Benjaman Kindler, MD;  Location: [facility];  Service: Gynecology;  Laterality: Bilateral;  . MASTECTOMY Right 03/2018  . MASTECTOMY W/ SENTINEL NODE  BIOPSY Right 03/19/2018   Procedure: MASTECTOMY WITH SENTINEL LYMPH NODE BIOPSY;  Surgeon: Robert Bellow, MD;   Location: [facility];  Service: General;  Laterality: Right;  . PORT-A-CATH REMOVAL Left 06/03/2019   Procedure: REMOVAL PORT-A-CATH;  Surgeon: Robert Bellow, MD;  Location: [facility];  Service: General;  Laterality: Left;  . PORTACATH PLACEMENT Left 10/24/2017   Procedure: INSERTION PORT-A-CATH;  Surgeon: Robert Bellow, MD;  Location: [facility];  Service: General;  Laterality: Left;  . PORTACATH PLACEMENT Right 09/28/2020   Procedure: INSERTION PORT-A-CATH;  Surgeon: Robert Bellow, MD;  Location: [facility];  Service: General;  Laterality: Right;  . REMOVAL OF TISSUE EXPANDER AND PLACEMENT OF IMPLANT Right 07/20/2018   Procedure: REMOVAL OF RIGHT BREAST TISSUE EXPANDER AND PLACEMENT OF IMPLANT;  Surgeon: Wallace Going, DO;  Location: [facility];  Service: Plastics;  Laterality: Right;  . SIMPLE MASTECTOMY WITH AXILLARY SENTINEL NODE BIOPSY Left 06/03/2019   Procedure: SIMPLE MASTECTOMY LEFT;  Surgeon: Robert Bellow, MD;  Location: [facility];  Service: General;  Laterality: Left;    Social History   Socioeconomic History  . Marital status: Married    Spouse name: Not on file  . Number of children: Not on file  . Years of education: Not on file  . Highest education level: Not on file  Occupational History  . Occupation: Occupational psychologist    Comment: Therapist, art   Tobacco Use  . Smoking status: Current Every Day Smoker    Packs/day: 0.50    Years: 18.00    Pack years: 9.00    Types: Cigarettes    Start date: 06/21/2018  . Smokeless tobacco: Never Used  Vaping Use  . Vaping Use: Never used  Substance and Sexual Activity  . Alcohol use: No  . Drug use: No  . Sexual activity: Yes    Birth control/protection: Injection, Other-see comments    Comment: has had hysterectomy  Other Topics Concern  . Not on file  Social History Narrative   Lives at home with husband and daughter   Social Determinants of Health   Financial  Resource Strain:   . Difficulty of Paying Living Expenses: Not on file  Food Insecurity:   . Worried About Charity fundraiser in the Last Year: Not on file  . Ran Out of Food in the Last Year: Not on file  Transportation Needs:   . Lack of Transportation (Medical): Not on file  . Lack of Transportation (Non-Medical): Not on file  Physical Activity:   . Days of Exercise per Week: Not on file  . Minutes of Exercise per Session: Not on file  Stress:   . Feeling of Stress : Not on file  Social Connections:   . Frequency of Communication with Friends and Family: Not on file  . Frequency of Social Gatherings with Friends and Family: Not on file  . Attends Religious Services: Not on file  . Active Member of Clubs or Organizations: Not on file  . Attends Archivist Meetings: Not on file  . Marital Status: Not on file  Intimate Partner Violence:   . Fear of Current or Ex-Partner: Not on file  . Emotionally Abused: Not on file  . Physically Abused: Not on file  . Sexually Abused: Not on file     Family History  Problem Relation Age of Onset  . Melanoma Maternal Aunt        other aunts  with BCC/SCC/Melanoma  . Diabetes Father   . Hypertension Father   . Hyperlipidemia Father   . Heart attack Father 107       "mild"  . Bladder Cancer Maternal Grandmother   . Cervical cancer Maternal Aunt 64       daughter w/ cervical cancer as well  . Melanoma Maternal Uncle        other uncles with BCC/SCC/Melanoma     Current Outpatient Medications:  .  acetaminophen (TYLENOL) 500 MG tablet, Take 500 mg by mouth every 6 (six) hours as needed., Disp: , Rfl:  .  albuterol (VENTOLIN HFA) 108 (90 Base) MCG/ACT inhaler, Inhale 2 puffs into the lungs every 6 (six) hours as needed for wheezing or shortness of breath. , Disp: , Rfl:  .  Calcium Carbonate-Vitamin D3 (CALCIUM 600/VITAMIN D) 600-400 MG-UNIT TABS, Take 2 tablets by mouth daily., Disp: 180 tablet, Rfl: 0 .  cholecalciferol (VITAMIN  D3) 25 MCG (1000 UNIT) tablet, Take 1 tablet (1,000 Units total) by mouth daily., Disp: 90 tablet, Rfl: 0 .  cyanocobalamin (,VITAMIN B-12,) 1000 MCG/ML injection, Inject 1,000 mcg into the skin every 30 (thirty) days., Disp: , Rfl:  .  cyclobenzaprine (FLEXERIL) 5 MG tablet, Take 5 mg by mouth 3 (three) times daily as needed for muscle spasms. , Disp: , Rfl:  .  escitalopram (LEXAPRO) 20 MG tablet, Take 20 mg by mouth daily. , Disp: , Rfl:  .  esomeprazole (NEXIUM) 40 MG capsule, Take 40 mg by mouth daily before breakfast. , Disp: , Rfl:  .  Eszopiclone 3 MG TABS, Take 3 mg by mouth at bedtime as needed (sleep). , Disp: , Rfl:  .  folic acid (FOLVITE) 1 MG tablet, Take 1 mg by mouth daily., Disp: , Rfl:  .  ibuprofen (ADVIL) 800 MG tablet, Take 800 mg by mouth every 8 (eight) hours as needed for moderate pain. , Disp: , Rfl:  .  lidocaine (LIDODERM) 5 %, Place 1 patch onto the skin every 12 (twelve) hours. Remove & Discard patch within 12 hours or as directed by MD, Disp: 10 patch, Rfl: 0 .  lidocaine-prilocaine (EMLA) cream, Apply to port and cover 1-2 hours prior to appointment, Disp: 30 g, Rfl: 3 .  loperamide (IMODIUM A-D) 2 MG tablet, Take 1 tablet (2 mg total) by mouth See admin instructions. Take 2 tablets with onset of diarrhea, then take 1 tablet every 2 hours until diarrhea stops. Maximum 8 tablets in 24 hours, Disp: 30 tablet, Rfl: 0 .  loratadine (CLARITIN) 10 MG tablet, Take 10 mg by mouth daily. , Disp: , Rfl:  .  LORazepam (ATIVAN) 1 MG tablet, Take 1 mg by mouth 3 (three) times daily., Disp: , Rfl:  .  magic mouthwash SOLN, Take 5 mLs by mouth 4 (four) times daily. (Patient taking differently: Take 5 mLs by mouth 4 (four) times daily as needed (esophagus pain). ), Disp: 480 mL, Rfl: 1 .  Magnesium 400 MG TABS, Take 400 mg by mouth 2 (two) times daily., Disp: , Rfl:  .  meloxicam (MOBIC) 7.5 MG tablet, Take 7.5 mg by mouth 2 (two) times daily as needed (pain/inflammation.). , Disp: ,  Rfl:  .  methotrexate (RHEUMATREX) 2.5 MG tablet, Take 20 mg by mouth every Sunday. , Disp: , Rfl:  .  metoprolol succinate (TOPROL-XL) 25 MG 24 hr tablet, Take 37.5 mg by mouth every morning. , Disp: , Rfl:  .  nortriptyline (PAMELOR) 10 MG capsule, Take  30 mg by mouth at bedtime. , Disp: , Rfl:  .  oxyCODONE (OXY IR/ROXICODONE) 5 MG immediate release tablet, Take 1-2 tablets (5-10 mg total) by mouth every 6 (six) hours as needed for moderate pain or severe pain., Disp: 60 tablet, Rfl: 0 .  oxyCODONE ER (XTAMPZA ER) 13.5 MG C12A, Take 13.5 mg by mouth every 12 (twelve) hours., Disp: 14 capsule, Rfl: 0 .  phentermine (ADIPEX-P) 37.5 MG tablet, Take 37.5 mg by mouth daily before breakfast. , Disp: , Rfl:  .  pregabalin (LYRICA) 150 MG capsule, Take 150 mg by mouth 2 (two) times daily., Disp: , Rfl:  .  promethazine (PHENERGAN) 25 MG tablet, Take 1 tablet (25 mg total) by mouth every 8 (eight) hours as needed for nausea or vomiting., Disp: 90 tablet, Rfl: 0 .  pyridOXINE (VITAMIN B-6) 100 MG tablet, Take 100 mg by mouth daily., Disp: , Rfl:  .  sucralfate (CARAFATE) 1 g tablet, Take 1 tablet (1 g total) by mouth 3 (three) times daily. Dissolve in 3-4 tbsp warm water, swish and swallow., Disp: 90 tablet, Rfl: 1 .  topiramate (TOPAMAX) 50 MG tablet, Take 50 mg by mouth 2 (two) times daily. , Disp: , Rfl:  .  VICTOZA 18 MG/3ML SOPN, Inject 1.8 mg into the skin daily., Disp: , Rfl:  .  vitamin C (ASCORBIC ACID) 500 MG tablet, Take 500 mg by mouth daily., Disp: , Rfl:  No current facility-administered medications for this visit.  Facility-Administered Medications Ordered in Other Visits:  .  heparin lock flush 100 unit/mL, 500 Units, Intravenous, Once, Earlie Server, MD .  heparin lock flush 100 unit/mL, 500 Units, Intravenous, Once, Earlie Server, MD .  sodium chloride flush (NS) 0.9 % injection 10 mL, 10 mL, Intravenous, PRN, Earlie Server, MD, 10 mL at 11/19/18 1249 .  sodium chloride flush (NS) 0.9 % injection 10  mL, 10 mL, Intravenous, Once, Earlie Server, MD .  sodium chloride flush (NS) 0.9 % injection 10 mL, 10 mL, Intravenous, PRN, Earlie Server, MD   Physical exam:  Vitals:   10/08/20 0843  BP: 112/73  Pulse: 100  Resp: 16  Temp: (!) 97.4 F (36.3 C)  Weight: 191 lb 12.8 oz (87 kg)  ECOG 1 Physical Exam Constitutional:      General: She is not in acute distress.    Appearance: She is not diaphoretic.  HENT:     Head: Normocephalic and atraumatic.     Nose: Nose normal.     Mouth/Throat:     Pharynx: No oropharyngeal exudate.  Eyes:     General: No scleral icterus.       Left eye: No discharge.     Conjunctiva/sclera: Conjunctivae normal.     Pupils: Pupils are equal, round, and reactive to light.  Neck:     Vascular: No JVD.  Cardiovascular:     Rate and Rhythm: Normal rate and regular rhythm.     Heart sounds: Normal heart sounds. No murmur heard.   Pulmonary:     Effort: Pulmonary effort is normal. No respiratory distress.     Breath sounds: Normal breath sounds. No wheezing or rales.  Chest:     Chest wall: No tenderness.  Abdominal:     General: Bowel sounds are normal. There is no distension.     Palpations: Abdomen is soft. There is no mass.     Tenderness: There is no abdominal tenderness. There is no rebound.  Musculoskeletal:  General: Normal range of motion.     Cervical back: Normal range of motion and neck supple.  Lymphadenopathy:     Cervical: No cervical adenopathy.  Skin:    General: Skin is warm and dry.     Findings: No erythema or rash.  Neurological:     Mental Status: She is alert and oriented to person, place, and time.     Cranial Nerves: No cranial nerve deficit.     Motor: No abnormal muscle tone.     Coordination: Coordination normal.  Psychiatric:        Mood and Affect: Affect normal.           CMP Latest Ref Rng & Units 09/17/2020  Glucose 70 - 99 mg/dL 104(H)  BUN 6 - 20 mg/dL 16  Creatinine 0.44 - 1.00 mg/dL 0.70  Sodium  135 - 145 mmol/L 138  Potassium 3.5 - 5.1 mmol/L 3.6  Chloride 98 - 111 mmol/L 107  CO2 22 - 32 mmol/L 22  Calcium 8.9 - 10.3 mg/dL 9.1  Total Protein 6.5 - 8.1 g/dL 7.4  Total Bilirubin 0.3 - 1.2 mg/dL 0.6  Alkaline Phos 38 - 126 U/L 77  AST 15 - 41 U/L 24  ALT 0 - 44 U/L 21   CBC Latest Ref Rng & Units 09/17/2020  WBC 4.0 - 10.5 K/uL 10.6(H)  Hemoglobin 12.0 - 15.0 g/dL 12.1  Hematocrit 36 - 46 % 35.5(L)  Platelets 150 - 400 K/uL 300   RADIOGRAPHIC STUDIES: I have personally reviewed the radiological images as listed and agreed with the findings in the report. MR Brain W Wo Contrast  Result Date: 08/30/2020 CLINICAL DATA:  Metastatic breast cancer, staging EXAM: MRI HEAD WITHOUT AND WITH CONTRAST TECHNIQUE: Multiplanar, multiecho pulse sequences of the brain and surrounding structures were obtained without and with intravenous contrast. CONTRAST:  64m GADAVIST GADOBUTROL 1 MMOL/ML IV SOLN COMPARISON:  None. FINDINGS: Brain: No acute infarction, hemorrhage, hydrocephalus, extra-axial collection or mass lesion. Normal white matter Normal enhancement.  Negative for metastatic disease. Vascular: Normal arterial flow voids.  Normal venous enhancement. Skull and upper cervical spine: Negative Sinuses/Orbits: Paranasal sinuses clear.  Normal orbit Other: None IMPRESSION: Normal MRI head with contrast. Negative for metastatic disease to the brain. Electronically Signed   By: CFranchot GalloM.D.   On: 08/30/2020 13:41   NM PET Image Initial (PI) Skull Base To Thigh  Result Date: 07/20/2020 CLINICAL DATA:  Initial treatment strategy for breast cancer, spine lesion. EXAM: NUCLEAR MEDICINE PET SKULL BASE TO THIGH TECHNIQUE: 10.2 mCi F-18 FDG was injected intravenously. Full-ring PET imaging was performed from the skull base to thigh after the radiotracer. CT data was obtained and used for attenuation correction and anatomic localization. Fasting blood glucose: 103 mg/dl COMPARISON:  CT chest 06/10/2020  and CT chest abdomen pelvis 09/24/2018. FINDINGS: Mediastinal blood pool activity: SUV max 1.9 Liver activity: SUV max NA NECK: No abnormal hypermetabolism. Incidental CT findings: None. CHEST: No hypermetabolic mediastinal, hilar or axillary lymph nodes. No hypermetabolic pulmonary nodules. Incidental CT findings: Heart size normal. No pericardial or pleural effusion. Surgical clips in the right axilla. Bilateral mastectomies. ABDOMEN/PELVIS: No abnormal hypermetabolism in the liver, adrenal glands, spleen or pancreas. No hypermetabolic lymph nodes. Incidental CT findings: Liver is unremarkable. Cholecystectomy. Adrenal glands, kidneys, spleen, pancreas, stomach and bowel are unremarkable. Small umbilical hernia contains fat. No free fluid. SKELETON: A lytic lesion in the right lamina of T7 has an SUV max of 8.4. No additional hypermetabolic  osseous lesions. Incidental CT findings: None. IMPRESSION: Hypermetabolic metastasis involving the posterior elements of T7. No additional evidence of metastatic disease in the neck, chest, abdomen or pelvis. Electronically Signed   By: Lorin Picket M.D.   On: 07/20/2020 13:38   CT Biopsy  Result Date: 07/29/2020 INDICATION: 39 year old female with a history of breast carcinoma referred for biopsy of a new FDG avid T7 right lamina lesion. EXAM: CT BIOPSY MEDICATIONS: None. ANESTHESIA/SEDATION: Moderate (conscious) sedation was employed during this procedure. A total of Versed 2.0 mg and Fentanyl 100 mcg was administered intravenously. Moderate Sedation Time: 13 minutes. The patient's level of consciousness and vital signs were monitored continuously by radiology nursing throughout the procedure under my direct supervision. FLUOROSCOPY TIME:  CT COMPLICATIONS: None PROCEDURE: Informed written consent was obtained from the patient after a thorough discussion of the procedural risks, benefits and alternatives. All questions were addressed. Maximal Sterile Barrier Technique  was utilized including caps, mask, sterile gowns, sterile gloves, sterile drape, hand hygiene and skin antiseptic. A timeout was performed prior to the initiation of the procedure. Patient position prone position on CT gantry table. Scout CT was acquired for planning purposes. The patient was then prepped and draped in the usual sterile fashion. 1% lidocaine was used for local anesthesia. Introducer needle was then advanced in a longitudinal orientation to the right lamina. Once we confirmed needle tip position, multiple 18 gauge core biopsy were achieved. These were placed into formalin. Needle was removed and a final image was stored. Patient tolerated the procedure well and remained hemodynamically stable throughout. No complications were encountered and no significant blood loss. IMPRESSION: Status post CT-guided right T7 lamina lesion biopsy. Signed, Dulcy Fanny. Dellia Nims, RPVI [facility] Electronically Signed   By: Corrie Mckusick D.O.   On: 07/29/2020 12:04   DG Chest Port 1 View  Result Date: 09/28/2020 CLINICAL DATA:  Status post Port-A-Cath insertion. EXAM: PORTABLE CHEST 1 VIEW COMPARISON:  06/10/2020 chest radiograph and prior. FINDINGS: Right chest wall Port-A-Cath tip overlies the upper atrium. No focal consolidation, pneumothorax or pleural effusion. No pulmonary edema. Prominence of the cardiomediastinal silhouette likely secondary to mild hypoinflation and AP technique. No acute osseous abnormality. IMPRESSION: Indwelling right chest wall Port-A-Cath.  No acute airspace disease. Electronically Signed   By: Primitivo Gauze M.D.   On: 09/28/2020 15:58   DG C-Arm 1-60 Min-No Report  Result Date: 09/28/2020 Fluoroscopy was utilized by the requesting physician.  No radiographic interpretation.   ECHOCARDIOGRAM LIMITED  Result Date: 09/14/2020    ECHOCARDIOGRAM LIMITED REPORT   Patient Name:   CECILEE ROSNER Date of Exam: 09/14/2020  Medical Rec #:  [id number]      Height:       67.0 in Accession #:    [id number]     Weight:       190.7 lb Date of Birth:  [birth year]      BSA:          1.982 m Patient Age:    39 years       BP:           110/66 mmHg Patient Gender: F              HR:           90 bpm. Exam Location:  [facility] Procedure: Limited Echo, Limited Color Doppler and Cardiac Doppler Indications:     v58.11 Chemotherapy evaluation  History:  Patient has prior history of Echocardiogram examinations, most                  recent 01/29/2020. Cardiomyopathy; COPD.  Sonographer:     Charmayne Sheer RDCS (AE) Referring Phys:  [id number] [place] Diagnosing Phys: Bartholome Bill MD  Sonographer Comments: Technically difficult study due to poor echo windows. Image acquisition challenging due to mastectomy. IMPRESSIONS  1. Left ventricular ejection fraction, by estimation, is 60 to 65%. Left ventricular ejection fraction by PLAX is 68 %. The left ventricle has normal function. Left ventricular diastolic parameters are consistent with Grade I diastolic dysfunction (impaired relaxation).  2. Right ventricular systolic function is normal. The right ventricular size is normal.  3. The mitral valve was not well visualized. No evidence of mitral valve regurgitation.  4. Aortic valve regurgitation is not visualized. FINDINGS  Left Ventricle: Left ventricular ejection fraction, by estimation, is 60 to 65%. Left ventricular ejection fraction by PLAX is 68 %. The left ventricle has normal function. The left ventricular internal cavity size was normal in size. There is borderline left ventricular hypertrophy. Left ventricular diastolic parameters are consistent with Grade I diastolic dysfunction (impaired relaxation). Right Ventricle: The right ventricular size is normal. Right ventricular systolic function is normal. Left Atrium: Left atrial size was normal in size. Right Atrium: Right atrial size was normal in size. Pericardium: There is no evidence of pericardial  effusion. Mitral Valve: The mitral valve was not well visualized. Tricuspid Valve: The tricuspid valve is not well visualized. Tricuspid valve regurgitation is not demonstrated. Aortic Valve: Aortic valve regurgitation is not visualized. Pulmonic Valve: The pulmonic valve was not well visualized. Pulmonic valve regurgitation is trivial. Aorta: The aortic root is normal in size and structure. IAS/Shunts: The interatrial septum was not well visualized. LEFT VENTRICLE PLAX 2D LV EF:         Left ventricular ejection fraction by PLAX is 68 %. LVIDd:         4.75 cm LVIDs:         2.95 cm LV PW:         0.88 cm LV IVS:        0.61 cm LVOT diam:     2.00 cm LVOT Area:     3.14 cm  LEFT ATRIUM         Index LA diam:    2.80 cm 1.41 cm/m                        PULMONIC VALVE AORTA                 PV Vmax:       0.94 m/s Ao Root diam: 2.90 cm PV Vmean:      62.500 cm/s                       PV VTI:        0.173 m                       PV Peak grad:  3.5 mmHg                       PV Mean grad:  2.0 mmHg   SHUNTS Systemic Diam: 2.00 cm Bartholome Bill MD Electronically signed by Bartholome Bill MD Signature Date/Time: 09/14/2020/12:03:33 PM    Final  Assessment and plan- Patient is a 39 y.o. female presents for evaluation of newly diagnosed multicentric right breast cancer.  Cancer Staging Breast cancer of upper-outer quadrant of right female breast [facility]) Staging form: Breast, AJCC 8th Edition - Clinical: G2, ER+, PR+, HER2- - Signed by Earlie Server, MD on 04/05/2018 - Pathologic stage from 04/04/2018: No Stage Recommended (ypT3, pN2, cM0, G3, ER+, PR-, HER2-) - Signed by Earlie Server, MD on 04/04/2018    1. Malignant neoplasm of upper-outer quadrant of right breast in female, estrogen receptor positive ([place])   2. HER2-positive carcinoma of breast ([place])   3. Neuropathy due to chemotherapeutic drug ([place])   4. Encounter for antineoplastic chemotherapy   5. Port-A-Cath in place   Cancer Staging Breast cancer of  upper-outer quadrant of right female breast [facility]) Staging form: Breast, AJCC 8th Edition - Clinical: G2, ER+, PR+, HER2- - Signed by Earlie Server, MD on 04/05/2018 - Pathologic stage from 04/04/2018: No Stage Recommended (ypT3, pN2, cM0, G3, ER+, PR-, HER2-) - Signed by Earlie Server, MD on 04/04/2018  Breast cancer with isolated bone metastasis, stage IV #Initially stage IIIA right breast cancer, ER positive, HER-2 negative status post bilateral oophorectomy, Right mastectomy and right axillary lymph node dissection, status post implant, and implant removal.  Status post left mastectomy and axillary sentinel lymph node biopsy. developed stage IV disease with biopsy-proven thoracic spine bone metastasis. Estrogen positive, HER-2 positive breast cancer. Status post radiation to T7 bone metastasis.    Continue Fulvestrant injection, D1, 15, 29, and then monthly. Her D15 injection was delayed (due to patient's preference), will schedule next injection to be 2 weeks from D15.  Also Zometa monthly.  Labs are reviewed and discussed with patient. Counts are stable.  Proceed with Transtuzumab treatment today. Hold Pertuzumab.   Diarrhea, likely chemotherapy induced.  I discussed with her about the imodium instruction. She feels that she may not be using enough imodium.  Also sent her prescription of Lomotil.  Encourage   #Neoplasm related pain, improved.   #Bone metastasis, labs are reviewed and discussed with patient.  Continue calcium and vitamin D supplementation.  Zometa monthly.   #Chemotherapy-induced neuropathy, continue Lyrica and nortriptyline.  #Depression.  Continue Lexapro 20 mg daily. #s/p medi port placement.  Mild erythema at the [facility] port insertion site. No purulent discharge or swelling. No significant sign of infection.  Close monitor.    Follow-up 2 weeks for fulvestrant injection.  Follow-up in 3 weeks for the next cycle of trastuzumab and Pertuzumab treatments.  Patient knows to  call clinic if she experiences concerning side effects  Earlie Server, MD, PhD Hematology Oncology [place] at [facility] 10/08/20

## 2020-10-08 NOTE — Progress Notes (Signed)
Patient has diarrhea that started a few days after her last treatment 3 weeks ago.  Imodium OTC does help short term.

## 2020-10-08 NOTE — Progress Notes (Signed)
0824- Patient has a small reddened area located on distal side of port incision site. Port was accessed this morning and the area that needle was inserted was clean, dry, intact, and without redness. MD, Dr. Tasia Catchings, notified and aware. MD to look at port incision site during visit this morning.  0916- Per MD, Dr. Tasia Catchings, order: okay to proceed with using port-a-cath for treatment today.  1103- Patient tolerated treatment well. Patient discharged to home at this time.

## 2020-10-09 ENCOUNTER — Other Ambulatory Visit: Payer: Self-pay | Admitting: Oncology

## 2020-10-09 ENCOUNTER — Encounter: Payer: Self-pay | Admitting: Oncology

## 2020-10-09 MED ORDER — LOPERAMIDE HCL 2 MG PO CAPS
ORAL_CAPSULE | ORAL | 3 refills | Status: DC
Start: 2020-10-09 — End: 2022-08-19

## 2020-10-09 NOTE — Addendum Note (Signed)
Addended by: Earlie Server on: 10/09/2020 04:57 PM   Modules accepted: Orders

## 2020-10-16 ENCOUNTER — Encounter: Payer: Self-pay | Admitting: Oncology

## 2020-10-19 ENCOUNTER — Ambulatory Visit: Payer: Medicare HMO

## 2020-10-19 ENCOUNTER — Other Ambulatory Visit: Payer: Medicare HMO

## 2020-10-19 ENCOUNTER — Other Ambulatory Visit: Payer: Self-pay

## 2020-10-19 ENCOUNTER — Inpatient Hospital Stay: Payer: Medicare HMO

## 2020-10-19 ENCOUNTER — Ambulatory Visit: Payer: Medicare HMO | Admitting: Oncology

## 2020-10-19 ENCOUNTER — Inpatient Hospital Stay: Payer: Medicare HMO | Attending: Oncology

## 2020-10-19 ENCOUNTER — Telehealth: Payer: Self-pay

## 2020-10-19 VITALS — BP 122/77 | HR 92 | Temp 96.4°F | Resp 18

## 2020-10-19 DIAGNOSIS — M1990 Unspecified osteoarthritis, unspecified site: Secondary | ICD-10-CM

## 2020-10-19 DIAGNOSIS — Z5111 Encounter for antineoplastic chemotherapy: Secondary | ICD-10-CM | POA: Diagnosis not present

## 2020-10-19 DIAGNOSIS — Z170 Estrogen receptor positive status [ER+]: Secondary | ICD-10-CM

## 2020-10-19 DIAGNOSIS — C50411 Malignant neoplasm of upper-outer quadrant of right female breast: Secondary | ICD-10-CM | POA: Insufficient documentation

## 2020-10-19 LAB — SEDIMENTATION RATE: Sed Rate: 33 mm/hr — ABNORMAL HIGH (ref 0–20)

## 2020-10-19 LAB — CBC WITH DIFFERENTIAL/PLATELET
Abs Immature Granulocytes: 0.03 10*3/uL (ref 0.00–0.07)
Basophils Absolute: 0 10*3/uL (ref 0.0–0.1)
Basophils Relative: 0 %
Eosinophils Absolute: 0.2 10*3/uL (ref 0.0–0.5)
Eosinophils Relative: 2 %
HCT: 32.9 % — ABNORMAL LOW (ref 36.0–46.0)
Hemoglobin: 11.1 g/dL — ABNORMAL LOW (ref 12.0–15.0)
Immature Granulocytes: 0 %
Lymphocytes Relative: 25 %
Lymphs Abs: 1.9 10*3/uL (ref 0.7–4.0)
MCH: 30.3 pg (ref 26.0–34.0)
MCHC: 33.7 g/dL (ref 30.0–36.0)
MCV: 89.9 fL (ref 80.0–100.0)
Monocytes Absolute: 0.6 10*3/uL (ref 0.1–1.0)
Monocytes Relative: 8 %
Neutro Abs: 4.7 10*3/uL (ref 1.7–7.7)
Neutrophils Relative %: 65 %
Platelets: 295 10*3/uL (ref 150–400)
RBC: 3.66 MIL/uL — ABNORMAL LOW (ref 3.87–5.11)
RDW: 14 % (ref 11.5–15.5)
WBC: 7.3 10*3/uL (ref 4.0–10.5)
nRBC: 0 % (ref 0.0–0.2)

## 2020-10-19 LAB — COMPREHENSIVE METABOLIC PANEL
ALT: 21 U/L (ref 0–44)
AST: 24 U/L (ref 15–41)
Albumin: 4.1 g/dL (ref 3.5–5.0)
Alkaline Phosphatase: 78 U/L (ref 38–126)
Anion gap: 6 (ref 5–15)
BUN: 12 mg/dL (ref 6–20)
CO2: 24 mmol/L (ref 22–32)
Calcium: 9 mg/dL (ref 8.9–10.3)
Chloride: 109 mmol/L (ref 98–111)
Creatinine, Ser: 0.76 mg/dL (ref 0.44–1.00)
GFR, Estimated: >60 mL/min (ref >60)
Glucose, Bld: 131 mg/dL — ABNORMAL HIGH (ref 70–99)
Potassium: 3.3 mmol/L — ABNORMAL LOW (ref 3.5–5.1)
Sodium: 139 mmol/L (ref 135–145)
Total Bilirubin: 0.3 mg/dL (ref 0.3–1.2)
Total Protein: 7.2 g/dL (ref 6.5–8.1)

## 2020-10-19 MED ORDER — HEPARIN SOD (PORK) LOCK FLUSH 100 UNIT/ML IV SOLN
500.0000 [IU] | Freq: Once | INTRAVENOUS | Status: AC
  Administered 2020-10-19: 500 [IU] via INTRAVENOUS
  Filled 2020-10-19: qty 5

## 2020-10-19 MED ORDER — SODIUM CHLORIDE 0.9 % IV SOLN
Freq: Once | INTRAVENOUS | Status: AC
  Filled 2020-10-19: qty 250

## 2020-10-19 MED ORDER — FULVESTRANT 250 MG/5ML IM SOLN
500.0000 mg | Freq: Once | INTRAMUSCULAR | Status: AC
  Administered 2020-10-19: 500 mg via INTRAMUSCULAR
  Filled 2020-10-19: qty 10

## 2020-10-19 MED ORDER — HEPARIN SOD (PORK) LOCK FLUSH 100 UNIT/ML IV SOLN
INTRAVENOUS | Status: AC
  Filled 2020-10-19: qty 5

## 2020-10-19 MED ORDER — ZOLEDRONIC ACID 4 MG/100ML IV SOLN
4.0000 mg | Freq: Once | INTRAVENOUS | Status: AC
  Administered 2020-10-19: 4 mg via INTRAVENOUS
  Filled 2020-10-19: qty 100

## 2020-10-19 MED ORDER — SODIUM CHLORIDE 0.9% FLUSH
10.0000 mL | Freq: Once | INTRAVENOUS | Status: AC
  Administered 2020-10-19: 10 mL via INTRAVENOUS
  Filled 2020-10-19: qty 10

## 2020-10-19 NOTE — Telephone Encounter (Addendum)
Patient brought printed lab orders for sedimentation rate, CBC, Albumin, Creatinine, AST, to be drawn for Dr. Jefm Bryant.  Orders for CBC, CMP, Sed Rate entered and results faxed to requesting MD.

## 2020-10-19 NOTE — Progress Notes (Signed)
Potassium: 3.3. MD, Dr. Tasia Catchings, notified and aware. Per MD order: ask patient to eat a banana a day and MD will follow up with Potassium level at next visit. Patient educated and verbalized understanding.  1445- Patient tolerated treatment well. Patient discharged to home at this time.

## 2020-10-26 ENCOUNTER — Other Ambulatory Visit: Payer: Self-pay | Admitting: Oncology

## 2020-10-28 ENCOUNTER — Other Ambulatory Visit: Payer: Self-pay

## 2020-10-28 MED ORDER — DIPHENOXYLATE-ATROPINE 2.5-0.025 MG PO TABS: 1.0000 | ORAL_TABLET | Freq: Four times a day (QID) | ORAL | 0 refills | Status: AC | PRN

## 2020-10-29 ENCOUNTER — Inpatient Hospital Stay: Payer: Medicare HMO

## 2020-10-29 ENCOUNTER — Encounter: Payer: Self-pay | Admitting: Oncology

## 2020-10-29 ENCOUNTER — Inpatient Hospital Stay (HOSPITAL_BASED_OUTPATIENT_CLINIC_OR_DEPARTMENT_OTHER): Payer: Medicare HMO | Admitting: Oncology

## 2020-10-29 VITALS — BP 113/73 | HR 85 | Temp 96.8°F | Resp 17

## 2020-10-29 VITALS — BP 120/77 | HR 97 | Temp 97.2°F | Resp 16 | Wt 192.0 lb

## 2020-10-29 DIAGNOSIS — G620 Drug-induced polyneuropathy: Secondary | ICD-10-CM | POA: Diagnosis not present

## 2020-10-29 DIAGNOSIS — C50919 Malignant neoplasm of unspecified site of unspecified female breast: Secondary | ICD-10-CM

## 2020-10-29 DIAGNOSIS — C50411 Malignant neoplasm of upper-outer quadrant of right female breast: Secondary | ICD-10-CM

## 2020-10-29 DIAGNOSIS — Z5111 Encounter for antineoplastic chemotherapy: Secondary | ICD-10-CM | POA: Diagnosis not present

## 2020-10-29 DIAGNOSIS — T451X5A Adverse effect of antineoplastic and immunosuppressive drugs, initial encounter: Secondary | ICD-10-CM | POA: Diagnosis not present

## 2020-10-29 DIAGNOSIS — Z170 Estrogen receptor positive status [ER+]: Secondary | ICD-10-CM

## 2020-10-29 DIAGNOSIS — C50811 Malignant neoplasm of overlapping sites of right female breast: Secondary | ICD-10-CM

## 2020-10-29 DIAGNOSIS — C50911 Malignant neoplasm of unspecified site of right female breast: Secondary | ICD-10-CM | POA: Diagnosis not present

## 2020-10-29 LAB — COMPREHENSIVE METABOLIC PANEL
ALT: 22 U/L (ref 0–44)
AST: 26 U/L (ref 15–41)
Albumin: 4 g/dL (ref 3.5–5.0)
Alkaline Phosphatase: 77 U/L (ref 38–126)
Anion gap: 8 (ref 5–15)
BUN: 14 mg/dL (ref 6–20)
CO2: 23 mmol/L (ref 22–32)
Calcium: 8.8 mg/dL — ABNORMAL LOW (ref 8.9–10.3)
Chloride: 107 mmol/L (ref 98–111)
Creatinine, Ser: 0.71 mg/dL (ref 0.44–1.00)
GFR, Estimated: >60 mL/min (ref >60)
Glucose, Bld: 104 mg/dL — ABNORMAL HIGH (ref 70–99)
Potassium: 3.5 mmol/L (ref 3.5–5.1)
Sodium: 138 mmol/L (ref 135–145)
Total Bilirubin: 0.4 mg/dL (ref 0.3–1.2)
Total Protein: 6.9 g/dL (ref 6.5–8.1)

## 2020-10-29 LAB — CBC WITH DIFFERENTIAL/PLATELET
Abs Immature Granulocytes: 0.02 10*3/uL (ref 0.00–0.07)
Basophils Absolute: 0.1 10*3/uL (ref 0.0–0.1)
Basophils Relative: 1 %
Eosinophils Absolute: 0.2 10*3/uL (ref 0.0–0.5)
Eosinophils Relative: 2 %
HCT: 33.1 % — ABNORMAL LOW (ref 36.0–46.0)
Hemoglobin: 11.3 g/dL — ABNORMAL LOW (ref 12.0–15.0)
Immature Granulocytes: 0 %
Lymphocytes Relative: 22 %
Lymphs Abs: 1.7 10*3/uL (ref 0.7–4.0)
MCH: 30.5 pg (ref 26.0–34.0)
MCHC: 34.1 g/dL (ref 30.0–36.0)
MCV: 89.2 fL (ref 80.0–100.0)
Monocytes Absolute: 0.6 10*3/uL (ref 0.1–1.0)
Monocytes Relative: 8 %
Neutro Abs: 5.2 10*3/uL (ref 1.7–7.7)
Neutrophils Relative %: 67 %
Platelets: 318 10*3/uL (ref 150–400)
RBC: 3.71 MIL/uL — ABNORMAL LOW (ref 3.87–5.11)
RDW: 13.8 % (ref 11.5–15.5)
WBC: 7.7 10*3/uL (ref 4.0–10.5)
nRBC: 0 % (ref 0.0–0.2)

## 2020-10-29 MED ORDER — HEPARIN SOD (PORK) LOCK FLUSH 100 UNIT/ML IV SOLN
500.0000 [IU] | Freq: Once | INTRAVENOUS | Status: AC
  Administered 2020-10-29: 500 [IU] via INTRAVENOUS
  Filled 2020-10-29: qty 5

## 2020-10-29 MED ORDER — TRASTUZUMAB-ANNS CHEMO 150 MG IV SOLR
6.0000 mg/kg | Freq: Once | INTRAVENOUS | Status: AC
  Administered 2020-10-29: 504 mg via INTRAVENOUS
  Filled 2020-10-29: qty 24

## 2020-10-29 MED ORDER — HEPARIN SOD (PORK) LOCK FLUSH 100 UNIT/ML IV SOLN
500.0000 [IU] | Freq: Once | INTRAVENOUS | Status: DC | PRN
  Filled 2020-10-29: qty 5

## 2020-10-29 MED ORDER — SODIUM CHLORIDE 0.9 % IV SOLN
420.0000 mg | Freq: Once | INTRAVENOUS | Status: AC
  Administered 2020-10-29: 420 mg via INTRAVENOUS
  Filled 2020-10-29: qty 14

## 2020-10-29 MED ORDER — FUROSEMIDE 20 MG PO TABS: 20.0000 mg | ORAL_TABLET | Freq: Every day | ORAL | 0 refills | Status: DC | PRN

## 2020-10-29 MED ORDER — SODIUM CHLORIDE 0.9% FLUSH
10.0000 mL | INTRAVENOUS | Status: DC | PRN
  Administered 2020-10-29: 10 mL via INTRAVENOUS
  Filled 2020-10-29: qty 10

## 2020-10-29 MED ORDER — SODIUM CHLORIDE 0.9 % IV SOLN
Freq: Once | INTRAVENOUS | Status: AC
  Filled 2020-10-29: qty 250

## 2020-10-29 MED ORDER — DIPHENHYDRAMINE HCL 25 MG PO CAPS
50.0000 mg | ORAL_CAPSULE | Freq: Once | ORAL | Status: AC
  Administered 2020-10-29: 50 mg via ORAL
  Filled 2020-10-29: qty 2

## 2020-10-29 MED ORDER — ACETAMINOPHEN 325 MG PO TABS
650.0000 mg | ORAL_TABLET | Freq: Once | ORAL | Status: AC
  Administered 2020-10-29: 650 mg via ORAL
  Filled 2020-10-29: qty 2

## 2020-10-29 NOTE — Progress Notes (Signed)
Pt tolerated infusion well. No s/s of distress or reaction noted, pt declines 30 minute post observation. Pt and VS stable at discharge.

## 2020-10-29 NOTE — Progress Notes (Signed)
Hematology/Oncology Follow Up Note [place] [phone] [phone]  Patient Care Team: Marguerita Merles, MD as PCP - General (Family Medicine) End, Harrell Gave, MD as PCP - Cardiology (Cardiology) Rico Junker, RN as Oncology Nurse Navigator Earlie Server, MD as Consulting Physician (Oncology) Bary Castilla, Forest Gleason, MD (General Surgery) Noreene Filbert, MD as Referring Physician (Radiation Oncology) Noreene Filbert, MD as Radiation Oncologist (Radiation Oncology) Noreene Filbert, MD as Radiation Oncologist (Radiation Oncology)   Name of the patient: Theresa Chaney  [id number]  03-01-1981   Date of visit: 10/29/20 REASON FOR VISIT Follow up for Assessment prior to chemotherapy treatment of breast cancer  Oncology History Cancer TREATMENT Neoadjuvant ddAC +one dose of Taxol, due to lack of response, surgery was offered. Case was discussed on breast tumor board. 03/19/2018 S/p right mastectomy and right axillary dissection, immediate breast reconstruction with placement of expanders.  ypT3 ypN2, + lymphovascular invasion,  Grade 3, margin is negative, close. ER 90%, PR 0%, HER2 IHC negative.  Also had elective bilateral salpingo-oophorectomy..  # 06/11/2018 s/p 11 cycles Taxol adjuvantly. Tolerated well.  # She has obtained dental clearance for starting Zometa.  S/p Zometa on 6/3/ 2019 # s/p adjuvant right chest wall radiation, finished 10/10/2018 #06/03/2019 underwent elective left prophylactic mastectomy and sentinel lymph node biopsy of left axilla.  # 06/03/2019 underwent elective left prophylactic mastectomy and sentinel lymph node biopsy of left axilla. Pathology negative for malignancy. Status post Mediport removal on 06/03/2019. She also underwent right implant removal on 06/03/2019. She also underwent right implant removal on 06/03/2019. #Negative genetic testing  #  chemotherapy-induced neuropathy, bilateral fingertips and lower extremities.   Patient is on Lyrica and nortriptyline.  Follows up with neurology. She follows up with lymphedema clinic.  #  07/07/2020, MRI thoracic spine without contrast showed lesions involving the T7 posterior elements most concerning for metastatic lesion.  No evidence of epidural tumor.  Minimal thoracic spondylosis without stenosis. MRI was reviewed by me and a PET scan was obtained for further evaluation. 07/20/2020, PET scan showed hypermetabolic metastasis involving the posterior element of T7, no additional evidence of metastasis in the neck, chest, abdomen or pelvis.  # 07/29/2020 T7 lesion biopsy showed metastatic carcinoma, compatible with breast origin.  Receptor status staining showed ER 71-80% positive, PR negative, HER-2 positive IHC 3+ # Patient finished spine radiation on 08/31/2020  INTERVAL HISTORY 39 yo female with above oncology history reviewed by me presents for follow-up of management of metastatic breast cancer.  Back pain has significantly improved.  Occasionally she has some discomfort Diarrhea has improved after utilizing Lomotil. Patient reports bilateral hand joint swelling.  She is on methotrexate for rheumatoid arthritis.  No leg swelling. She is on monthly fulvestrant and zometa infusion.    Review of Systems  Constitutional: Negative for chills, fever, malaise/fatigue and weight loss.  HENT: Negative for sore throat.   Eyes: Negative for redness.  Respiratory: Negative for cough, shortness of breath and wheezing.   Cardiovascular: Negative for chest pain, palpitations and leg swelling.  Gastrointestinal: Negative for abdominal pain, blood in stool, heartburn, nausea and vomiting.  Genitourinary: Negative for dysuria.  Musculoskeletal: Positive for joint pain. Negative for back pain and myalgias.       Hand swelling  Skin: Negative for rash.  Neurological: Positive for tingling. Negative for dizziness and tremors.  Endo/Heme/Allergies: Does not bruise/bleed easily.   Psychiatric/Behavioral: Negative for hallucinations.    No Known Allergies  Patient Active Problem List  Diagnosis Date Noted   Encounter for antineoplastic chemotherapy 10/29/2020   Metastatic breast cancer ([place]) 08/31/2020   Goals of care, counseling/discussion 08/11/2020   Neuropathy due to chemotherapeutic drug ([place]) 08/11/2020   HER2-positive carcinoma of breast ([place]) 08/11/2020   Rheumatoid arteritis ([place]) 10/13/2019   Bilateral hand swelling 10/03/2019   Fracture of neck of metacarpal bone 05/14/2019   Chronic fatigue 04/09/2019   Polyarthralgia 04/09/2019   Status post right breast reconstruction 02/26/2019   Status post right mastectomy 02/26/2019   Mastalgia 02/15/2019   Shortness of breath 08/23/2018   Nonischemic cardiomyopathy ([place]) 08/23/2018   Preprocedural cardiovascular examination 08/23/2018   Tachycardia 08/23/2018   Palpitations 08/23/2018   Estrogen receptor positive status (ER+) 04/04/2018   Acquired absence of right breast and nipple 04/03/2018   Breast cancer of upper-outer quadrant of right female breast ([place]) 03/19/2018   Family history of cancer    Malignant neoplasm of overlapping sites of right breast in female, estrogen receptor positive ([place]) 10/19/2017   Gastroesophageal reflux disease without esophagitis 02/24/2017   Generalized anxiety disorder 10/03/2014   Headache 10/03/2014     Past Medical History:  Diagnosis Date   Anemia    Arthritis    BRCA negative 11/26/2017   Breast cancer ([place]) 10/11/2017   Multifocal, ER positive, PR negative, HER-2 negative. ypT3 ypN2a 8.7 cm, 4/15 nodes   Cardiomyopathy ([place])    a. 10/2017 Echo: EF 60-65%, no rwma, Gr1 DD, nl RV size/fxn; b. 04/2018 Echo: EF 55-60%, no rwma, Nl RV size/fxn; c. 08/2018 Echo: EF 45%, diff HK, ? HK of antsept wall. Gr1 DD. Mild MR. Mild LAE/RAE. Mod dil RV.    Chronic bronchitis ([place]) 11/2017   COPD (chronic obstructive pulmonary disease)  (HCC)    MILD PER CXR   Depression    Family history of cancer    GERD (gastroesophageal reflux disease)    Headache    MIGRAINES   Heart murmur    ASYMPTOMATIC   Personal history of chemotherapy    current for right breast ca     Past Surgical History:  Procedure Laterality Date   AXILLARY LYMPH NODE DISSECTION Right 03/19/2018   Procedure: AXILLARY LYMPH NODE DISSECTION;  Surgeon: Robert Bellow, MD;  Location: [facility];  Service: General;  Laterality: Right;   BREAST BIOPSY Right 10/11/2017   12:30 posterior coil clip invasive mammary carcinoma   BREAST BIOPSY Right 10/11/2017   11:30 middle depth ribbon clip DCIS   BREAST BIOPSY Right 10/11/2017   5:30 anterior depth x shape invasive ductal carcinoma   BREAST IMPLANT REMOVAL Right 06/03/2019   Procedure: REMOVAL OF RIGHT BREAST IMPLANTS;  Surgeon: Wallace Going, DO;  Location: [facility];  Service: Plastics;  Laterality: Right;   BREAST RECONSTRUCTION WITH PLACEMENT OF TISSUE EXPANDER AND FLEX HD (ACELLULAR HYDRATED DERMIS) Right 03/19/2018   Procedure: BREAST RECONSTRUCTION WITH PLACEMENT OF TISSUE EXPANDER AND FLEX HD (ACELLULAR HYDRATED DERMIS);  Surgeon: Wallace Going, DO;  Location: [facility];  Service: Plastics;  Laterality: Right;   CARPAL TUNNEL RELEASE Bilateral 2020   CHOLECYSTECTOMY N/A 04/27/2020   Procedure: LAPAROSCOPIC CHOLECYSTECTOMY WITH INTRAOPERATIVE CHOLANGIOGRAM;  Surgeon: Robert Bellow, MD;  Location: [facility];  Service: General;  Laterality: N/A;   ESOPHAGOGASTRODUODENOSCOPY (EGD) WITH PROPOFOL N/A 04/17/2020   Procedure: ESOPHAGOGASTRODUODENOSCOPY (EGD) WITH PROPOFOL;  Surgeon: Robert Bellow, MD;  Location: [facility];  Service: Endoscopy;  Laterality: N/A;  with biopsy   LAPAROSCOPIC BILATERAL SALPINGO OOPHERECTOMY Bilateral 03/19/2018   Procedure:  LAPAROSCOPIC BILATERAL SALPINGO OOPHORECTOMY;  Surgeon: Benjaman Kindler, MD;  Location: [facility];  Service: Gynecology;   Laterality: Bilateral;   MASTECTOMY Right 03/2018   MASTECTOMY W/ SENTINEL NODE BIOPSY Right 03/19/2018   Procedure: MASTECTOMY WITH SENTINEL LYMPH NODE BIOPSY;  Surgeon: Robert Bellow, MD;  Location: [facility];  Service: General;  Laterality: Right;   PORT-A-CATH REMOVAL Left 06/03/2019   Procedure: REMOVAL PORT-A-CATH;  Surgeon: Robert Bellow, MD;  Location: [facility];  Service: General;  Laterality: Left;   PORTACATH PLACEMENT Left 10/24/2017   Procedure: INSERTION PORT-A-CATH;  Surgeon: Robert Bellow, MD;  Location: [facility];  Service: General;  Laterality: Left;   PORTACATH PLACEMENT Right 09/28/2020   Procedure: INSERTION PORT-A-CATH;  Surgeon: Robert Bellow, MD;  Location: [facility];  Service: General;  Laterality: Right;   REMOVAL OF TISSUE EXPANDER AND PLACEMENT OF IMPLANT Right 07/20/2018   Procedure: REMOVAL OF RIGHT BREAST TISSUE EXPANDER AND PLACEMENT OF IMPLANT;  Surgeon: Wallace Going, DO;  Location: [place];  Service: Plastics;  Laterality: Right;   SIMPLE MASTECTOMY WITH AXILLARY SENTINEL NODE BIOPSY Left 06/03/2019   Procedure: SIMPLE MASTECTOMY LEFT;  Surgeon: Robert Bellow, MD;  Location: [facility];  Service: General;  Laterality: Left;    Social History   Socioeconomic History   Marital status: Married    Spouse name: Not on file   Number of children: Not on file   Years of education: Not on file   Highest education level: Not on file  Occupational History   Occupation: pharmacy tech    Comment: [employer] [facility]   Tobacco Use   Smoking status: Current Every Day Smoker    Packs/day: 0.50    Years: 18.00    Pack years: 9.00    Types: Cigarettes    Start date: 06/21/2018   Smokeless tobacco: Never Used  Vaping Use   Vaping Use: Never used  Substance and Sexual Activity   Alcohol use: No   Drug use: No   Sexual activity: Yes    Birth control/protection: Injection, Other-see  comments    Comment: has had hysterectomy  Other Topics Concern   Not on file  Social History Narrative   Lives at home with husband and daughter   Social Determinants of Health   Financial Resource Strain:    Difficulty of Paying Living Expenses: Not on file  Food Insecurity:    Worried About Charity fundraiser in the Last Year: Not on file   [employer] of Food in the Last Year: Not on file  Transportation Needs:    Lack of Transportation (Medical): Not on file   Lack of Transportation (Non-Medical): Not on file  Physical Activity:    Days of Exercise per Week: Not on file   Minutes of Exercise per Session: Not on file  Stress:    Feeling of Stress : Not on file  Social Connections:    Frequency of Communication with Friends and Family: Not on file   Frequency of Social Gatherings with Friends and Family: Not on file   Attends Religious Services: Not on file   Active Member of Clubs or Organizations: Not on file   Attends Archivist Meetings: Not on file   Marital Status: Not on file  Intimate Partner Violence:    Fear of Current or Ex-Partner: Not on file   Emotionally Abused: Not on file   Physically Abused: Not on file   Sexually  Abused: Not on file     Family History  Problem Relation Age of Onset   Melanoma Maternal Aunt        other aunts with BCC/SCC/Melanoma   Diabetes Father    Hypertension Father    Hyperlipidemia Father    Heart attack Father 12       "mild"   Bladder Cancer Maternal Grandmother    Cervical cancer Maternal Aunt 39       daughter w/ cervical cancer as well   Melanoma Maternal Uncle        other uncles with BCC/SCC/Melanoma     Current Outpatient Medications:    acetaminophen (TYLENOL) 500 MG tablet, Take 500 mg by mouth every 6 (six) hours as needed., Disp: , Rfl:    albuterol (VENTOLIN HFA) 108 (90 Base) MCG/ACT inhaler, Inhale 2 puffs into the lungs every 6 (six) hours as needed for wheezing or  shortness of breath. , Disp: , Rfl:    Calcium Carbonate-Vitamin D3 (CALCIUM 600/VITAMIN D) 600-400 MG-UNIT TABS, Take 2 tablets by mouth daily., Disp: 180 tablet, Rfl: 0   cholecalciferol (VITAMIN D3) 25 MCG (1000 UNIT) tablet, Take 1 tablet (1,000 Units total) by mouth daily., Disp: 90 tablet, Rfl: 0   cyanocobalamin (,VITAMIN B-12,) 1000 MCG/ML injection, Inject 1,000 mcg into the skin every 30 (thirty) days., Disp: , Rfl:    cyclobenzaprine (FLEXERIL) 5 MG tablet, Take 5 mg by mouth 3 (three) times daily as needed for muscle spasms. , Disp: , Rfl:    diphenoxylate-atropine (LOMOTIL) 2.5-0.025 MG tablet, Take 1 tablet by mouth 4 (four) times daily as needed for diarrhea or loose stools., Disp: 60 tablet, Rfl: 0   escitalopram (LEXAPRO) 20 MG tablet, Take 20 mg by mouth daily. , Disp: , Rfl:    esomeprazole (NEXIUM) 40 MG capsule, Take 40 mg by mouth daily before breakfast. , Disp: , Rfl:    Eszopiclone 3 MG TABS, Take 3 mg by mouth at bedtime as needed (sleep). , Disp: , Rfl:    folic acid (FOLVITE) 1 MG tablet, Take 1 mg by mouth daily., Disp: , Rfl:    ibuprofen (ADVIL) 800 MG tablet, Take 800 mg by mouth every 8 (eight) hours as needed for moderate pain. , Disp: , Rfl:    lidocaine-prilocaine (EMLA) cream, Apply to port and cover 1-2 hours prior to appointment, Disp: 30 g, Rfl: 3   loperamide (IMODIUM) 2 MG capsule, Take 2 tablets with onset of diarrhea, then take 1 tablet every 2 hours until diarrhea stops. Maximum 8 tablets in 24hours, Disp: 30 capsule, Rfl: 3   loratadine (CLARITIN) 10 MG tablet, Take 10 mg by mouth daily. , Disp: , Rfl:    LORazepam (ATIVAN) 1 MG tablet, Take 1 mg by mouth 3 (three) times daily., Disp: , Rfl:    Magnesium 400 MG TABS, Take 400 mg by mouth 2 (two) times daily., Disp: , Rfl:    meloxicam (MOBIC) 7.5 MG tablet, Take 7.5 mg by mouth 2 (two) times daily as needed (pain/inflammation.). , Disp: , Rfl:    methotrexate (RHEUMATREX) 2.5 MG tablet,  Take 20 mg by mouth every Sunday. , Disp: , Rfl:    metoprolol succinate (TOPROL-XL) 25 MG 24 hr tablet, Take 37.5 mg by mouth every morning. , Disp: , Rfl:    nortriptyline (PAMELOR) 10 MG capsule, Take 30 mg by mouth at bedtime. , Disp: , Rfl:    oxyCODONE (OXY IR/ROXICODONE) 5 MG immediate release tablet, Take 1-2 tablets (  5-10 mg total) by mouth every 6 (six) hours as needed for moderate pain or severe pain., Disp: 60 tablet, Rfl: 0   oxyCODONE ER (XTAMPZA ER) 13.5 MG C12A, Take 13.5 mg by mouth every 12 (twelve) hours., Disp: 14 capsule, Rfl: 0   phentermine (ADIPEX-P) 37.5 MG tablet, Take 37.5 mg by mouth daily before breakfast. , Disp: , Rfl:    pregabalin (LYRICA) 150 MG capsule, Take 150 mg by mouth 2 (two) times daily., Disp: , Rfl:    promethazine (PHENERGAN) 25 MG tablet, Take 1 tablet (25 mg total) by mouth every 8 (eight) hours as needed for nausea or vomiting., Disp: 90 tablet, Rfl: 0   pyridOXINE (VITAMIN B-6) 100 MG tablet, Take 100 mg by mouth daily., Disp: , Rfl:    sucralfate (CARAFATE) 1 g tablet, Take 1 tablet (1 g total) by mouth 3 (three) times daily. Dissolve in 3-4 tbsp warm water, swish and swallow., Disp: 90 tablet, Rfl: 1   topiramate (TOPAMAX) 50 MG tablet, Take 50 mg by mouth 2 (two) times daily. , Disp: , Rfl:    vitamin C (ASCORBIC ACID) 500 MG tablet, Take 500 mg by mouth daily., Disp: , Rfl:    furosemide (LASIX) 20 MG tablet, Take 1 tablet (20 mg total) by mouth daily as needed., Disp: 7 tablet, Rfl: 0   magic mouthwash SOLN, Take 5 mLs by mouth 4 (four) times daily. (Patient not taking: Reported on 10/08/2020), Disp: 480 mL, Rfl: 1 No current facility-administered medications for this visit.  Facility-Administered Medications Ordered in Other Visits:    heparin lock flush 100 unit/mL, 500 Units, Intravenous, Once, Earlie Server, MD   heparin lock flush 100 unit/mL, 500 Units, Intravenous, Once, Earlie Server, MD   heparin lock flush 100 unit/mL, 500  Units, Intracatheter, Once PRN, Earlie Server, MD   pertuzumab (PERJETA) 420 mg in sodium chloride 0.9 % 250 mL chemo infusion, 420 mg, Intravenous, Once, Earlie Server, MD, Last Rate: 528 mL/hr at 10/29/20 1040, 420 mg at 10/29/20 1040   sodium chloride flush (NS) 0.9 % injection 10 mL, 10 mL, Intravenous, PRN, Earlie Server, MD, 10 mL at 11/19/18 1249   sodium chloride flush (NS) 0.9 % injection 10 mL, 10 mL, Intravenous, Once, Earlie Server, MD   sodium chloride flush (NS) 0.9 % injection 10 mL, 10 mL, Intravenous, PRN, Earlie Server, MD, 10 mL at 10/29/20 0809   Physical exam:  Vitals:   10/29/20 0835  BP: 120/77  Pulse: 97  Resp: 16  Temp: (!) 97.2 F (36.2 C)  TempSrc: Tympanic  SpO2: 100%  Weight: 192 lb (87.1 kg)  ECOG 1 Physical Exam Constitutional:      General: She is not in acute distress.    Appearance: She is not diaphoretic.  HENT:     Head: Normocephalic and atraumatic.     Nose: Nose normal.     Mouth/Throat:     Pharynx: No oropharyngeal exudate.  Eyes:     General: No scleral icterus.       Left eye: No discharge.     Conjunctiva/sclera: Conjunctivae normal.     Pupils: Pupils are equal, round, and reactive to light.  Neck:     Vascular: No JVD.  Cardiovascular:     Rate and Rhythm: Normal rate and regular rhythm.     Heart sounds: Normal heart sounds. No murmur heard.   Pulmonary:     Effort: Pulmonary effort is normal. No respiratory distress.     Breath  sounds: Normal breath sounds. No wheezing or rales.  Chest:     Chest wall: No tenderness.  Abdominal:     General: Bowel sounds are normal. There is no distension.     Palpations: Abdomen is soft. There is no mass.     Tenderness: There is no abdominal tenderness. There is no rebound.  Musculoskeletal:        General: Normal range of motion.     Cervical back: Normal range of motion and neck supple.  Lymphadenopathy:     Cervical: No cervical adenopathy.  Skin:    General: Skin is warm and dry.     Findings: No  erythema or rash.  Neurological:     Mental Status: She is alert and oriented to person, place, and time.     Cranial Nerves: No cranial nerve deficit.     Motor: No abnormal muscle tone.     Coordination: Coordination normal.  Psychiatric:        Mood and Affect: Affect normal.           CMP Latest Ref Rng & Units 10/29/2020  Glucose 70 - 99 mg/dL 104(H)  BUN 6 - 20 mg/dL 14  Creatinine 0.44 - 1.00 mg/dL 0.71  Sodium 135 - 145 mmol/L 138  Potassium 3.5 - 5.1 mmol/L 3.5  Chloride 98 - 111 mmol/L 107  CO2 22 - 32 mmol/L 23  Calcium 8.9 - 10.3 mg/dL 8.8(L)  Total Protein 6.5 - 8.1 g/dL 6.9  Total Bilirubin 0.3 - 1.2 mg/dL 0.4  Alkaline Phos 38 - 126 U/L 77  AST 15 - 41 U/L 26  ALT 0 - 44 U/L 22   CBC Latest Ref Rng & Units 10/29/2020  WBC 4.0 - 10.5 K/uL 7.7  Hemoglobin 12.0 - 15.0 g/dL 11.3(L)  Hematocrit 36 - 46 % 33.1(L)  Platelets 150 - 400 K/uL 318   RADIOGRAPHIC STUDIES: I have personally reviewed the radiological images as listed and agreed with the findings in the report. MR Brain W Wo Contrast  Result Date: 08/30/2020 CLINICAL DATA:  Metastatic breast cancer, staging EXAM: MRI HEAD WITHOUT AND WITH CONTRAST TECHNIQUE: Multiplanar, multiecho pulse sequences of the brain and surrounding structures were obtained without and with intravenous contrast. CONTRAST:  17m GADAVIST GADOBUTROL 1 MMOL/ML IV SOLN COMPARISON:  None. FINDINGS: Brain: No acute infarction, hemorrhage, hydrocephalus, extra-axial collection or mass lesion. Normal white matter Normal enhancement.  Negative for metastatic disease. Vascular: Normal arterial flow voids.  Normal venous enhancement. Skull and upper cervical spine: Negative Sinuses/Orbits: Paranasal sinuses clear.  Normal orbit Other: None IMPRESSION: Normal MRI head with contrast. Negative for metastatic disease to the brain. Electronically Signed   By: CFranchot GalloM.D.   On: 08/30/2020 13:41   DG Chest Port 1 View  Result Date:  09/28/2020 CLINICAL DATA:  Status post Port-A-Cath insertion. EXAM: PORTABLE CHEST 1 VIEW COMPARISON:  06/10/2020 chest radiograph and prior. FINDINGS: Right chest wall Port-A-Cath tip overlies the upper atrium. No focal consolidation, pneumothorax or pleural effusion. No pulmonary edema. Prominence of the cardiomediastinal silhouette likely secondary to mild hypoinflation and AP technique. No acute osseous abnormality. IMPRESSION: Indwelling right chest wall Port-A-Cath.  No acute airspace disease. Electronically Signed   By: CPrimitivo GauzeM.D.   On: 09/28/2020 15:58   DG C-Arm 1-60 Min-No Report  Result Date: 09/28/2020 Fluoroscopy was utilized by the requesting physician.  No radiographic interpretation.   ECHOCARDIOGRAM LIMITED  Result Date: 09/14/2020    ECHOCARDIOGRAM LIMITED REPORT  Patient Name:   GLESSIE EUSTICE Date of Exam: 09/14/2020 Medical Rec #:  [id number]      Height:       67.0 in Accession #:    [id number]     Weight:       190.7 lb Date of Birth:  12-Mar-1981      BSA:          1.982 m Patient Age:    39 years       BP:           110/66 mmHg Patient Gender: F              HR:           90 bpm. Exam Location:  [facility] Procedure: Limited Echo, Limited Color Doppler and Cardiac Doppler Indications:     v58.11 Chemotherapy evaluation  History:         Patient has prior history of Echocardiogram examinations, most                  recent 01/29/2020. Cardiomyopathy; COPD.  Sonographer:     Charmayne Sheer RDCS (AE) Referring Phys:  [id number] [place] Diagnosing Phys: Bartholome Bill MD  Sonographer Comments: Technically difficult study due to poor echo windows. Image acquisition challenging due to mastectomy. IMPRESSIONS  1. Left ventricular ejection fraction, by estimation, is 60 to 65%. Left ventricular ejection fraction by PLAX is 68 %. The left ventricle has normal function. Left ventricular diastolic parameters are consistent with Grade I diastolic dysfunction (impaired relaxation).  2. Right  ventricular systolic function is normal. The right ventricular size is normal.  3. The mitral valve was not well visualized. No evidence of mitral valve regurgitation.  4. Aortic valve regurgitation is not visualized. FINDINGS  Left Ventricle: Left ventricular ejection fraction, by estimation, is 60 to 65%. Left ventricular ejection fraction by PLAX is 68 %. The left ventricle has normal function. The left ventricular internal cavity size was normal in size. There is borderline left ventricular hypertrophy. Left ventricular diastolic parameters are consistent with Grade I diastolic dysfunction (impaired relaxation). Right Ventricle: The right ventricular size is normal. Right ventricular systolic function is normal. Left Atrium: Left atrial size was normal in size. Right Atrium: Right atrial size was normal in size. Pericardium: There is no evidence of pericardial effusion. Mitral Valve: The mitral valve was not well visualized. Tricuspid Valve: The tricuspid valve is not well visualized. Tricuspid valve regurgitation is not demonstrated. Aortic Valve: Aortic valve regurgitation is not visualized. Pulmonic Valve: The pulmonic valve was not well visualized. Pulmonic valve regurgitation is trivial. Aorta: The aortic root is normal in size and structure. IAS/Shunts: The interatrial septum was not well visualized. LEFT VENTRICLE PLAX 2D LV EF:         Left ventricular ejection fraction by PLAX is 68 %. LVIDd:         4.75 cm LVIDs:         2.95 cm LV PW:         0.88 cm LV IVS:        0.61 cm LVOT diam:     2.00 cm LVOT Area:     3.14 cm  LEFT ATRIUM         Index LA diam:    2.80 cm 1.41 cm/m                        PULMONIC VALVE AORTA  PV Vmax:       0.94 m/s Ao Root diam: 2.90 cm PV Vmean:      62.500 cm/s                       PV VTI:        0.173 m                       PV Peak grad:  3.5 mmHg                       PV Mean grad:  2.0 mmHg   SHUNTS Systemic Diam: 2.00 cm Bartholome Bill MD  Electronically signed by Bartholome Bill MD Signature Date/Time: 09/14/2020/12:03:33 PM    Final         Assessment and plan- Patient is a 39 y.o. female presents for evaluation of newly diagnosed multicentric right breast cancer.  Cancer Staging Breast cancer of upper-outer quadrant of right female breast [facility]) Staging form: Breast, AJCC 8th Edition - Clinical: G2, ER+, PR+, HER2- - Signed by Earlie Server, MD on 04/05/2018 - Pathologic stage from 04/04/2018: No Stage Recommended (ypT3, pN2, cM0, G3, ER+, PR-, HER2-) - Signed by Earlie Server, MD on 04/04/2018    1. HER2-positive carcinoma of breast ([employer])   2. Neuropathy due to chemotherapeutic drug ([place])   3. Encounter for antineoplastic chemotherapy   4. Metastatic breast cancer ([place])   Cancer Staging Breast cancer of upper-outer quadrant of right female breast ([place]) Staging form: Breast, AJCC 8th Edition - Clinical: G2, ER+, PR+, HER2- - Signed by Earlie Server, MD on 04/05/2018 - Pathologic stage from 04/04/2018: No Stage Recommended (ypT3, pN2, cM0, G3, ER+, PR-, HER2-) - Signed by Earlie Server, MD on 04/04/2018  Breast cancer with isolated bone metastasis, stage IV #Initially stage IIIA right breast cancer, ER positive, HER-2 negative status post bilateral oophorectomy, Right mastectomy and right axillary lymph node dissection, status post implant, and implant removal.  Status post left mastectomy and axillary sentinel lymph node biopsy. developed stage IV disease with biopsy-proven thoracic spine bone metastasis. Estrogen positive, HER-2 positive breast cancer. Status post radiation to T7 bone metastasis.    Continue Fulvestrant and Zometa monthly.  Labs are reviewed and discussed with patient. Counts are stable. Diarrhea has improved.  Symptoms are controllable with Lomotil Proceed with trastuzumab and Pertuzumab treatment today Plan repeat scan 3 months after finishing radiation.   #Bone metastasis, labs are reviewed and discussed with patient.   Continue calcium and vitamin D supplementation.  Zometa monthly.   #Chemotherapy-induced neuropathy, continue Lyrica and nortriptyline.  #Depression.  Continue Lexapro 20 mg daily. #Hand swelling, pre-existing condition.  Probably due to chronic inflammation from arthritis. Worse trial of Lasix 20 mg daily as needed.  I sent a prescription of 1 week supply for patient to try.   Follow-up in 3 weeks for the next cycle of trastuzumab and Pertuzumab treatments.  Patient knows to call clinic if she experiences concerning side effects  Earlie Server, MD, PhD Hematology Oncology [place] at [facility] 10/29/20

## 2020-11-08 ENCOUNTER — Encounter: Payer: Self-pay | Admitting: Oncology

## 2020-11-08 DIAGNOSIS — Z170 Estrogen receptor positive status [ER+]: Secondary | ICD-10-CM

## 2020-11-08 DIAGNOSIS — C50411 Malignant neoplasm of upper-outer quadrant of right female breast: Secondary | ICD-10-CM

## 2020-11-08 DIAGNOSIS — C50919 Malignant neoplasm of unspecified site of unspecified female breast: Secondary | ICD-10-CM

## 2020-11-18 ENCOUNTER — Other Ambulatory Visit: Payer: Medicare HMO

## 2020-11-18 ENCOUNTER — Ambulatory Visit: Payer: Medicare HMO

## 2020-11-18 ENCOUNTER — Telehealth: Payer: Self-pay

## 2020-11-18 ENCOUNTER — Ambulatory Visit: Payer: Medicare HMO | Admitting: Oncology

## 2020-11-18 NOTE — Telephone Encounter (Signed)
Long term Disability claim form completed and faxed to [employer].   Fax #: [phone]

## 2020-11-19 ENCOUNTER — Inpatient Hospital Stay (HOSPITAL_BASED_OUTPATIENT_CLINIC_OR_DEPARTMENT_OTHER): Payer: Medicare HMO | Admitting: Oncology

## 2020-11-19 ENCOUNTER — Inpatient Hospital Stay: Payer: Medicare HMO | Attending: Oncology

## 2020-11-19 ENCOUNTER — Encounter: Payer: Self-pay | Admitting: Oncology

## 2020-11-19 ENCOUNTER — Inpatient Hospital Stay: Payer: Medicare HMO

## 2020-11-19 ENCOUNTER — Other Ambulatory Visit: Payer: Self-pay

## 2020-11-19 VITALS — BP 105/71 | HR 90 | Temp 96.9°F | Resp 16 | Wt 193.3 lb

## 2020-11-19 DIAGNOSIS — C50411 Malignant neoplasm of upper-outer quadrant of right female breast: Secondary | ICD-10-CM | POA: Insufficient documentation

## 2020-11-19 DIAGNOSIS — M1990 Unspecified osteoarthritis, unspecified site: Secondary | ICD-10-CM

## 2020-11-19 DIAGNOSIS — C7951 Secondary malignant neoplasm of bone: Secondary | ICD-10-CM | POA: Insufficient documentation

## 2020-11-19 DIAGNOSIS — T451X5A Adverse effect of antineoplastic and immunosuppressive drugs, initial encounter: Secondary | ICD-10-CM

## 2020-11-19 DIAGNOSIS — Z5111 Encounter for antineoplastic chemotherapy: Secondary | ICD-10-CM | POA: Diagnosis not present

## 2020-11-19 DIAGNOSIS — M899 Disorder of bone, unspecified: Secondary | ICD-10-CM | POA: Insufficient documentation

## 2020-11-19 DIAGNOSIS — Z79899 Other long term (current) drug therapy: Secondary | ICD-10-CM | POA: Diagnosis not present

## 2020-11-19 DIAGNOSIS — C50919 Malignant neoplasm of unspecified site of unspecified female breast: Secondary | ICD-10-CM

## 2020-11-19 DIAGNOSIS — Z170 Estrogen receptor positive status [ER+]: Secondary | ICD-10-CM

## 2020-11-19 DIAGNOSIS — Z5112 Encounter for antineoplastic immunotherapy: Secondary | ICD-10-CM | POA: Diagnosis present

## 2020-11-19 DIAGNOSIS — G620 Drug-induced polyneuropathy: Secondary | ICD-10-CM | POA: Diagnosis not present

## 2020-11-19 DIAGNOSIS — C50811 Malignant neoplasm of overlapping sites of right female breast: Secondary | ICD-10-CM

## 2020-11-19 LAB — COMPREHENSIVE METABOLIC PANEL
ALT: 21 U/L (ref 0–44)
AST: 26 U/L (ref 15–41)
Albumin: 4 g/dL (ref 3.5–5.0)
Alkaline Phosphatase: 79 U/L (ref 38–126)
Anion gap: 9 (ref 5–15)
BUN: 14 mg/dL (ref 6–20)
CO2: 23 mmol/L (ref 22–32)
Calcium: 8.4 mg/dL — ABNORMAL LOW (ref 8.9–10.3)
Chloride: 106 mmol/L (ref 98–111)
Creatinine, Ser: 0.77 mg/dL (ref 0.44–1.00)
GFR, Estimated: >60 mL/min (ref >60)
Glucose, Bld: 111 mg/dL — ABNORMAL HIGH (ref 70–99)
Potassium: 3.4 mmol/L — ABNORMAL LOW (ref 3.5–5.1)
Sodium: 138 mmol/L (ref 135–145)
Total Bilirubin: 0.5 mg/dL (ref 0.3–1.2)
Total Protein: 7.1 g/dL (ref 6.5–8.1)

## 2020-11-19 LAB — CBC WITH DIFFERENTIAL/PLATELET
Abs Immature Granulocytes: 0.03 10*3/uL (ref 0.00–0.07)
Basophils Absolute: 0 10*3/uL (ref 0.0–0.1)
Basophils Relative: 1 %
Eosinophils Absolute: 0.2 10*3/uL (ref 0.0–0.5)
Eosinophils Relative: 2 %
HCT: 32.9 % — ABNORMAL LOW (ref 36.0–46.0)
Hemoglobin: 11.1 g/dL — ABNORMAL LOW (ref 12.0–15.0)
Immature Granulocytes: 0 %
Lymphocytes Relative: 23 %
Lymphs Abs: 1.9 10*3/uL (ref 0.7–4.0)
MCH: 30.6 pg (ref 26.0–34.0)
MCHC: 33.7 g/dL (ref 30.0–36.0)
MCV: 90.6 fL (ref 80.0–100.0)
Monocytes Absolute: 0.8 10*3/uL (ref 0.1–1.0)
Monocytes Relative: 9 %
Neutro Abs: 5.3 10*3/uL (ref 1.7–7.7)
Neutrophils Relative %: 65 %
Platelets: 345 10*3/uL (ref 150–400)
RBC: 3.63 MIL/uL — ABNORMAL LOW (ref 3.87–5.11)
RDW: 13.6 % (ref 11.5–15.5)
WBC: 8.3 10*3/uL (ref 4.0–10.5)
nRBC: 0 % (ref 0.0–0.2)

## 2020-11-19 MED ORDER — ACETAMINOPHEN 325 MG PO TABS
650.0000 mg | ORAL_TABLET | Freq: Once | ORAL | Status: AC
  Administered 2020-11-19: 650 mg via ORAL
  Filled 2020-11-19: qty 2

## 2020-11-19 MED ORDER — FULVESTRANT 250 MG/5ML IM SOLN
500.0000 mg | Freq: Once | INTRAMUSCULAR | Status: AC
  Administered 2020-11-19: 500 mg via INTRAMUSCULAR
  Filled 2020-11-19: qty 10

## 2020-11-19 MED ORDER — SODIUM CHLORIDE 0.9 % IV SOLN
420.0000 mg | Freq: Once | INTRAVENOUS | Status: AC
  Administered 2020-11-19: 420 mg via INTRAVENOUS
  Filled 2020-11-19: qty 14

## 2020-11-19 MED ORDER — DIPHENHYDRAMINE HCL 25 MG PO CAPS
50.0000 mg | ORAL_CAPSULE | Freq: Once | ORAL | Status: AC
  Administered 2020-11-19: 50 mg via ORAL
  Filled 2020-11-19: qty 2

## 2020-11-19 MED ORDER — HEPARIN SOD (PORK) LOCK FLUSH 100 UNIT/ML IV SOLN
500.0000 [IU] | Freq: Once | INTRAVENOUS | Status: AC | PRN
  Administered 2020-11-19: 500 [IU]
  Filled 2020-11-19: qty 5

## 2020-11-19 MED ORDER — TRASTUZUMAB-ANNS CHEMO 150 MG IV SOLR
6.0000 mg/kg | Freq: Once | INTRAVENOUS | Status: AC
  Administered 2020-11-19: 504 mg via INTRAVENOUS
  Filled 2020-11-19: qty 24

## 2020-11-19 MED ORDER — PROMETHAZINE HCL 25 MG PO TABS: 25.0000 mg | ORAL_TABLET | Freq: Three times a day (TID) | ORAL | 0 refills | Status: DC | PRN

## 2020-11-19 MED ORDER — ZOLEDRONIC ACID 4 MG/100ML IV SOLN
4.0000 mg | Freq: Once | INTRAVENOUS | Status: AC
  Administered 2020-11-19: 4 mg via INTRAVENOUS
  Filled 2020-11-19: qty 100

## 2020-11-19 MED ORDER — SODIUM CHLORIDE 0.9 % IV SOLN
Freq: Once | INTRAVENOUS | Status: AC
  Filled 2020-11-19: qty 250

## 2020-11-19 NOTE — Progress Notes (Signed)
Patient is having new muscle cramps.  The furosemide did not increase urine output so still has bloating.

## 2020-11-19 NOTE — Progress Notes (Signed)
Per Benjamine Mola RN Per Dr. Tasia Catchings okay to proceed with scheduled treatment at this time. Per Janeann Merl RN per Dr. Tasia Catchings okay to proceed with Zometa with Calcium of 8.4.   1125: Pt tolerated infusion well. Pt stable at discharge.

## 2020-11-19 NOTE — Progress Notes (Signed)
Hematology/Oncology Follow Up Note [place] [phone] [phone]  Patient Care Team: Marguerita Merles, MD as PCP - General (Family Medicine) End, Harrell Gave, MD as PCP - Cardiology (Cardiology) Rico Junker, RN as Oncology Nurse Navigator Earlie Server, MD as Consulting Physician (Oncology) Bary Castilla, Forest Gleason, MD (General Surgery) Noreene Filbert, MD as Referring Physician (Radiation Oncology) Noreene Filbert, MD as Radiation Oncologist (Radiation Oncology) Noreene Filbert, MD as Radiation Oncologist (Radiation Oncology)   Name of the patient: Theresa Chaney  [id number]  12-Jul-1981   Date of visit: 11/19/20 REASON FOR VISIT Follow up for Assessment prior to chemotherapy treatment of breast cancer  Oncology History Cancer TREATMENT Neoadjuvant ddAC +one dose of Taxol, due to lack of response, surgery was offered. Case was discussed on breast tumor board. 03/19/2018 S/p right mastectomy and right axillary dissection, immediate breast reconstruction with placement of expanders.  ypT3 ypN2, + lymphovascular invasion,  Grade 3, margin is negative, close. ER 90%, PR 0%, HER2 IHC negative.  Also had elective bilateral salpingo-oophorectomy..  # 06/11/2018 s/p 11 cycles Taxol adjuvantly. Tolerated well.  # She has obtained dental clearance for starting Zometa.  S/p Zometa on 6/3/ 2019 # s/p adjuvant right chest wall radiation, finished 10/10/2018 #06/03/2019 underwent elective left prophylactic mastectomy and sentinel lymph node biopsy of left axilla.  # 06/03/2019 underwent elective left prophylactic mastectomy and sentinel lymph node biopsy of left axilla. Pathology negative for malignancy. Status post Mediport removal on 06/03/2019. She also underwent right implant removal on 06/03/2019. She also underwent right implant removal on 06/03/2019. #Negative genetic testing  #  chemotherapy-induced neuropathy, bilateral fingertips and lower extremities.   Patient is on Lyrica and nortriptyline.  Follows up with neurology. She follows up with lymphedema clinic.  #  07/07/2020, MRI thoracic spine without contrast showed lesions involving the T7 posterior elements most concerning for metastatic lesion.  No evidence of epidural tumor.  Minimal thoracic spondylosis without stenosis. MRI was reviewed by me and a PET scan was obtained for further evaluation. 07/20/2020, PET scan showed hypermetabolic metastasis involving the posterior element of T7, no additional evidence of metastasis in the neck, chest, abdomen or pelvis.  # 07/29/2020 T7 lesion biopsy showed metastatic carcinoma, compatible with breast origin.  Receptor status staining showed ER 71-80% positive, PR negative, HER-2 positive IHC 3+ # Patient finished spine radiation on 08/31/2020   INTERVAL HISTORY 39 yo female with above oncology history reviewed by me presents for follow-up of management of metastatic breast cancer.  Back pain has significantly improved.  Occasionally she has some discomfort Diarrhea symptoms are controlled with Lomotil PRN.  Patient reports bilateral hand joint swelling. Lasix did not help.  She is on methotrexate for rheumatoid arthritis.  No leg swelling. She is on monthly fulvestrant and zometa infusion.  She only takes Calcium 6100m and Vitamin D 400 units.  Leg cramps. And also mild back pain, same area where she got RT.    Review of Systems  Constitutional: Negative for chills, fever, malaise/fatigue and weight loss.  HENT: Negative for sore throat.   Eyes: Negative for redness.  Respiratory: Negative for cough, shortness of breath and wheezing.   Cardiovascular: Negative for chest pain, palpitations and leg swelling.  Gastrointestinal: Negative for abdominal pain, blood in stool, heartburn, nausea and vomiting.  Genitourinary: Negative for dysuria.  Musculoskeletal: Positive for joint pain. Negative for back pain and myalgias.       Hand swelling  Skin:  Negative for rash.  Neurological:  Positive for tingling. Negative for dizziness and tremors.  Endo/Heme/Allergies: Does not bruise/bleed easily.  Psychiatric/Behavioral: Negative for hallucinations.    No Known Allergies  Patient Active Problem List   Diagnosis Date Noted  . Encounter for antineoplastic chemotherapy 10/29/2020  . Metastatic breast cancer ([place]) 08/31/2020  . Goals of care, counseling/discussion 08/11/2020  . Neuropathy due to chemotherapeutic drug ([place]) 08/11/2020  . HER2-positive carcinoma of breast ([place]) 08/11/2020  . Rheumatoid arteritis ([place]) 10/13/2019  . Bilateral hand swelling 10/03/2019  . Fracture of neck of metacarpal bone 05/14/2019  . Chronic fatigue 04/09/2019  . Polyarthralgia 04/09/2019  . Status post right breast reconstruction 02/26/2019  . Status post right mastectomy 02/26/2019  . Mastalgia 02/15/2019  . Shortness of breath 08/23/2018  . Nonischemic cardiomyopathy ([place]) 08/23/2018  . Preprocedural cardiovascular examination 08/23/2018  . Tachycardia 08/23/2018  . Palpitations 08/23/2018  . Estrogen receptor positive status (ER+) 04/04/2018  . Acquired absence of right breast and nipple 04/03/2018  . Breast cancer of upper-outer quadrant of right female breast ([place]) 03/19/2018  . Family history of cancer   . Malignant neoplasm of overlapping sites of right breast in female, estrogen receptor positive ([place]) 10/19/2017  . Gastroesophageal reflux disease without esophagitis 02/24/2017  . Generalized anxiety disorder 10/03/2014  . Headache 10/03/2014     Past Medical History:  Diagnosis Date  . Anemia   . Arthritis   . BRCA negative 11/26/2017  . Breast cancer ([place]) 10/11/2017   Multifocal, ER positive, PR negative, HER-2 negative. ypT3 ypN2a 8.7 cm, 4/15 nodes  . Cardiomyopathy ([place])    a. 10/2017 Echo: EF 60-65%, no rwma, Gr1 DD, nl RV size/fxn; b. 04/2018 Echo: EF 55-60%, no rwma, Nl RV size/fxn; c. 08/2018 Echo: EF 45%, diff HK, ? HK of  antsept wall. Gr1 DD. Mild MR. Mild LAE/RAE. Mod dil RV.   [name] Chronic bronchitis ([place]) 11/2017  . COPD (chronic obstructive pulmonary disease) ([place])    MILD PER CXR  . Depression   . Family history of cancer   . GERD (gastroesophageal reflux disease)   . Headache    MIGRAINES  . Heart murmur    ASYMPTOMATIC  . Personal history of chemotherapy    current for right breast ca     Past Surgical History:  Procedure Laterality Date  . AXILLARY LYMPH NODE DISSECTION Right 03/19/2018   Procedure: AXILLARY LYMPH NODE DISSECTION;  Surgeon: Robert Bellow, MD;  Location: [facility] ORS;  Service: General;  Laterality: Right;  . BREAST BIOPSY Right 10/11/2017   12:30 posterior coil clip invasive mammary carcinoma  . BREAST BIOPSY Right 10/11/2017   11:30 middle depth ribbon clip DCIS  . BREAST BIOPSY Right 10/11/2017   5:30 anterior depth x shape invasive ductal carcinoma  . BREAST IMPLANT REMOVAL Right 06/03/2019   Procedure: REMOVAL OF RIGHT BREAST IMPLANTS;  Surgeon: Wallace Going, DO;  Location: [facility] ORS;  Service: Plastics;  Laterality: Right;  . BREAST RECONSTRUCTION WITH PLACEMENT OF TISSUE EXPANDER AND FLEX HD (ACELLULAR HYDRATED DERMIS) Right 03/19/2018   Procedure: BREAST RECONSTRUCTION WITH PLACEMENT OF TISSUE EXPANDER AND FLEX HD (ACELLULAR HYDRATED DERMIS);  Surgeon: Wallace Going, DO;  Location: [facility] ORS;  Service: Plastics;  Laterality: Right;  . CARPAL TUNNEL RELEASE Bilateral 2020  . CHOLECYSTECTOMY N/A 04/27/2020   Procedure: LAPAROSCOPIC CHOLECYSTECTOMY WITH INTRAOPERATIVE CHOLANGIOGRAM;  Surgeon: Robert Bellow, MD;  Location: [facility] OR[facility];  Service: General;  Laterality: N/A;  . ESOPHAGOGASTRODUODENOSCOPY (EGD) WITH PROPOFOL N/A 04/17/2020   Procedure: ESOPHAGOGASTRODUODENOSCOPY (EGD)  WITH PROPOFOL;  Surgeon: Robert Bellow, MD;  Location[facility];  Service: Endoscopy;  Laterality: N/A;  with biopsy  . LAPAROSCOPIC BILATERAL SALPINGO OOPHERECTOMY Bilateral  03/19/2018   Procedure: LAPAROSCOPIC BILATERAL SALPINGO OOPHORECTOMY;  Surgeon: Benjaman Kindler, MD;  Location: [facility] ORS;  Service: Gynecology;  Laterality: Bilateral;  . MASTECTOMY Right 03/2018  . MASTECTOMY W/ SENTINEL NODE BIOPSY Right 03/19/2018   Procedure: MASTECTOMY WITH SENTINEL LYMPH NODE BIOPSY;  Surgeon: Robert Bellow, MD;  Location: [facility];  Service: General;  Laterality: Right;  . PORT-A-CATH REMOVAL Left 06/03/2019   Procedure: REMOVAL PORT-A-CATH;  Surgeon: Robert Bellow, MD;  Location: [facility];  Service: General;  Laterality: Left;  . PORTACATH PLACEMENT Left 10/24/2017   Procedure: INSERTION PORT-A-CATH;  Surgeon: Robert Bellow, MD;  Location: [facility];  Service: General;  Laterality: Left;  . PORTACATH PLACEMENT Right 09/28/2020   Procedure: INSERTION PORT-A-CATH;  Surgeon: Robert Bellow, MD;  Location: [facility];  Service: General;  Laterality: Right;  . REMOVAL OF TISSUE EXPANDER AND PLACEMENT OF IMPLANT Right 07/20/2018   Procedure: REMOVAL OF RIGHT BREAST TISSUE EXPANDER AND PLACEMENT OF IMPLANT;  Surgeon: Wallace Going, DO;  Location: [place];  Service: Plastics;  Laterality: Right;  . SIMPLE MASTECTOMY WITH AXILLARY SENTINEL NODE BIOPSY Left 06/03/2019   Procedure: SIMPLE MASTECTOMY LEFT;  Surgeon: Robert Bellow, MD;  Location: [facility];  Service: General;  Laterality: Left;    Social History   Socioeconomic History  . Marital status: Married    Spouse name: Not on file  . Number of children: Not on file  . Years of education: Not on file  . Highest education level: Not on file  Occupational History  . Occupation: Occupational psychologist    Comment: Therapist, art   Tobacco Use  . Smoking status: Current Every Day Smoker    Packs/day: 0.50    Years: 18.00    Pack years: 9.00    Types: Cigarettes    Start date: 06/21/2018  . Smokeless tobacco: Never Used  Vaping Use  . Vaping Use: Never used   Substance and Sexual Activity  . Alcohol use: No  . Drug use: No  . Sexual activity: Yes    Birth control/protection: Injection, Other-see comments    Comment: has had hysterectomy  Other Topics Concern  . Not on file  Social History Narrative   Lives at home with husband and daughter   Social Determinants of Health   Financial Resource Strain: Not on file  Food Insecurity: Not on file  Transportation Needs: Not on file  Physical Activity: Not on file  Stress: Not on file  Social Connections: Not on file  Intimate Partner Violence: Not on file     Family History  Problem Relation Age of Onset  . Melanoma Maternal Aunt        other aunts with BCC/SCC/Melanoma  . Diabetes Father   . Hypertension Father   . Hyperlipidemia Father   . Heart attack Father 49       "mild"  . Bladder Cancer Maternal Grandmother   . Cervical cancer Maternal Aunt 64       daughter w/ cervical cancer as well  . Melanoma Maternal Uncle        other uncles with BCC/SCC/Melanoma     Current Outpatient Medications:  .  acetaminophen (TYLENOL) 500 MG tablet, Take 500 mg by mouth every 6 (six) hours as needed., Disp: , Rfl:  .  albuterol (VENTOLIN HFA) 108 (90 Base) MCG/ACT inhaler, Inhale 2 puffs into the lungs every 6 (six) hours as needed for wheezing or shortness of breath. , Disp: , Rfl:  .  Calcium Carbonate-Vitamin D3 (CALCIUM 600/VITAMIN D) 600-400 MG-UNIT TABS, Take 2 tablets by mouth daily., Disp: 180 tablet, Rfl: 0 .  cholecalciferol (VITAMIN D3) 25 MCG (1000 UNIT) tablet, Take 1 tablet (1,000 Units total) by mouth daily., Disp: 90 tablet, Rfl: 0 .  cyanocobalamin (,VITAMIN B-12,) 1000 MCG/ML injection, Inject 1,000 mcg into the skin every 30 (thirty) days., Disp: , Rfl:  .  cyclobenzaprine (FLEXERIL) 5 MG tablet, Take 5 mg by mouth 3 (three) times daily as needed for muscle spasms. , Disp: , Rfl:  .  diphenoxylate-atropine (LOMOTIL) 2.5-0.025 MG tablet, Take 1 tablet by mouth 4 (four) times  daily as needed for diarrhea or loose stools., Disp: 60 tablet, Rfl: 0 .  escitalopram (LEXAPRO) 20 MG tablet, Take 20 mg by mouth daily. , Disp: , Rfl:  .  esomeprazole (NEXIUM) 40 MG capsule, Take 40 mg by mouth daily before breakfast. , Disp: , Rfl:  .  Eszopiclone 3 MG TABS, Take 3 mg by mouth at bedtime as needed (sleep). , Disp: , Rfl:  .  folic acid (FOLVITE) 1 MG tablet, Take 1 mg by mouth daily., Disp: , Rfl:  .  ibuprofen (ADVIL) 800 MG tablet, Take 800 mg by mouth every 8 (eight) hours as needed for moderate pain. , Disp: , Rfl:  .  lidocaine-prilocaine (EMLA) cream, Apply to port and cover 1-2 hours prior to appointment, Disp: 30 g, Rfl: 3 .  loperamide (IMODIUM) 2 MG capsule, Take 2 tablets with onset of diarrhea, then take 1 tablet every 2 hours until diarrhea stops. Maximum 8 tablets in 24hours, Disp: 30 capsule, Rfl: 3 .  loratadine (CLARITIN) 10 MG tablet, Take 10 mg by mouth daily. , Disp: , Rfl:  .  LORazepam (ATIVAN) 1 MG tablet, Take 1 mg by mouth 3 (three) times daily., Disp: , Rfl:  .  Magnesium 400 MG TABS, Take 400 mg by mouth 2 (two) times daily., Disp: , Rfl:  .  meloxicam (MOBIC) 7.5 MG tablet, Take 7.5 mg by mouth 2 (two) times daily as needed (pain/inflammation.). , Disp: , Rfl:  .  methotrexate (RHEUMATREX) 2.5 MG tablet, Take 20 mg by mouth every Sunday. , Disp: , Rfl:  .  metoprolol succinate (TOPROL-XL) 25 MG 24 hr tablet, Take 37.5 mg by mouth every morning. , Disp: , Rfl:  .  nortriptyline (PAMELOR) 10 MG capsule, Take 30 mg by mouth at bedtime. , Disp: , Rfl:  .  oxyCODONE (OXY IR/ROXICODONE) 5 MG immediate release tablet, Take 1-2 tablets (5-10 mg total) by mouth every 6 (six) hours as needed for moderate pain or severe pain., Disp: 60 tablet, Rfl: 0 .  oxyCODONE ER (XTAMPZA ER) 13.5 MG C12A, Take 13.5 mg by mouth every 12 (twelve) hours., Disp: 14 capsule, Rfl: 0 .  phentermine (ADIPEX-P) 37.5 MG tablet, Take 37.5 mg by mouth daily before breakfast. , Disp: ,  Rfl:  .  pregabalin (LYRICA) 150 MG capsule, Take 150 mg by mouth 2 (two) times daily., Disp: , Rfl:  .  pyridOXINE (VITAMIN B-6) 100 MG tablet, Take 100 mg by mouth daily., Disp: , Rfl:  .  sucralfate (CARAFATE) 1 g tablet, Take 1 tablet (1 g total) by mouth 3 (three) times daily. Dissolve in 3-4 tbsp warm water, swish and swallow., Disp: 90 tablet,  Rfl: 1 .  topiramate (TOPAMAX) 50 MG tablet, Take 50 mg by mouth 2 (two) times daily. , Disp: , Rfl:  .  vitamin C (ASCORBIC ACID) 500 MG tablet, Take 500 mg by mouth daily., Disp: , Rfl:  .  furosemide (LASIX) 20 MG tablet, Take 1 tablet (20 mg total) by mouth daily as needed. (Patient not taking: Reported on 11/19/2020), Disp: 7 tablet, Rfl: 0 .  magic mouthwash SOLN, Take 5 mLs by mouth 4 (four) times daily. (Patient not taking: No sig reported), Disp: 480 mL, Rfl: 1 .  promethazine (PHENERGAN) 25 MG tablet, Take 1 tablet (25 mg total) by mouth every 8 (eight) hours as needed for nausea or vomiting., Disp: 90 tablet, Rfl: 0 No current facility-administered medications for this visit.  Facility-Administered Medications Ordered in Other Visits:  .  heparin lock flush 100 unit/mL, 500 Units, Intravenous, Once, Earlie Server, MD .  sodium chloride flush (NS) 0.9 % injection 10 mL, 10 mL, Intravenous, PRN, Earlie Server, MD, 10 mL at 11/19/18 1249 .  sodium chloride flush (NS) 0.9 % injection 10 mL, 10 mL, Intravenous, Once, Earlie Server, MD   Physical exam:  Vitals:   11/19/20 0845  BP: 105/71  Pulse: 90  Resp: 16  Temp: (!) 96.9 F (36.1 C)  Weight: 193 lb 4.8 oz (87.7 kg)  ECOG 1 Physical Exam Constitutional:      General: She is not in acute distress.    Appearance: She is not diaphoretic.  HENT:     Head: Normocephalic and atraumatic.     Nose: Nose normal.     Mouth/Throat:     Pharynx: No oropharyngeal exudate.  Eyes:     General: No scleral icterus.       Left eye: No discharge.     Conjunctiva/sclera: Conjunctivae normal.     Pupils:  Pupils are equal, round, and reactive to light.  Neck:     Vascular: No JVD.  Cardiovascular:     Rate and Rhythm: Normal rate and regular rhythm.     Heart sounds: Normal heart sounds. No murmur heard.   Pulmonary:     Effort: Pulmonary effort is normal. No respiratory distress.     Breath sounds: Normal breath sounds. No wheezing or rales.  Chest:     Chest wall: No tenderness.  Abdominal:     General: Bowel sounds are normal. There is no distension.     Palpations: Abdomen is soft. There is no mass.     Tenderness: There is no abdominal tenderness. There is no rebound.  Musculoskeletal:        General: Normal range of motion.     Cervical back: Normal range of motion and neck supple.  Lymphadenopathy:     Cervical: No cervical adenopathy.  Skin:    General: Skin is warm and dry.     Findings: No erythema or rash.  Neurological:     Mental Status: She is alert and oriented to person, place, and time.     Cranial Nerves: No cranial nerve deficit.     Motor: No abnormal muscle tone.     Coordination: Coordination normal.  Psychiatric:        Mood and Affect: Affect normal.           CMP Latest Ref Rng & Units 11/19/2020  Glucose 70 - 99 mg/dL 111(H)  BUN 6 - 20 mg/dL 14  Creatinine 0.44 - 1.00 mg/dL 0.77  Sodium 135 - 145 mmol/L  138  Potassium 3.5 - 5.1 mmol/L 3.4(L)  Chloride 98 - 111 mmol/L 106  CO2 22 - 32 mmol/L 23  Calcium 8.9 - 10.3 mg/dL 8.4(L)  Total Protein 6.5 - 8.1 g/dL 7.1  Total Bilirubin 0.3 - 1.2 mg/dL 0.5  Alkaline Phos 38 - 126 U/L 79  AST 15 - 41 U/L 26  ALT 0 - 44 U/L 21   CBC Latest Ref Rng & Units 11/19/2020  WBC 4.0 - 10.5 K/uL 8.3  Hemoglobin 12.0 - 15.0 g/dL 11.1(L)  Hematocrit 36.0 - 46.0 % 32.9(L)  Platelets 150 - 400 K/uL 345   RADIOGRAPHIC STUDIES: I have personally reviewed the radiological images as listed and agreed with the findings in the report. MR Brain W Wo Contrast  Result Date: 08/30/2020 CLINICAL DATA:  Metastatic  breast cancer, staging EXAM: MRI HEAD WITHOUT AND WITH CONTRAST TECHNIQUE: Multiplanar, multiecho pulse sequences of the brain and surrounding structures were obtained without and with intravenous contrast. CONTRAST:  76m GADAVIST GADOBUTROL 1 MMOL/ML IV SOLN COMPARISON:  None. FINDINGS: Brain: No acute infarction, hemorrhage, hydrocephalus, extra-axial collection or mass lesion. Normal white matter Normal enhancement.  Negative for metastatic disease. Vascular: Normal arterial flow voids.  Normal venous enhancement. Skull and upper cervical spine: Negative Sinuses/Orbits: Paranasal sinuses clear.  Normal orbit Other: None IMPRESSION: Normal MRI head with contrast. Negative for metastatic disease to the brain. Electronically Signed   By: CFranchot GalloM.D.   On: 08/30/2020 13:41   DG Chest Port 1 View  Result Date: 09/28/2020 CLINICAL DATA:  Status post Port-A-Cath insertion. EXAM: PORTABLE CHEST 1 VIEW COMPARISON:  06/10/2020 chest radiograph and prior. FINDINGS: Right chest wall Port-A-Cath tip overlies the upper atrium. No focal consolidation, pneumothorax or pleural effusion. No pulmonary edema. Prominence of the cardiomediastinal silhouette likely secondary to mild hypoinflation and AP technique. No acute osseous abnormality. IMPRESSION: Indwelling right chest wall Port-A-Cath.  No acute airspace disease. Electronically Signed   By: CPrimitivo GauzeM.D.   On: 09/28/2020 15:58   DG C-Arm 1-60 Min-No Report  Result Date: 09/28/2020 Fluoroscopy was utilized by the requesting physician.  No radiographic interpretation.   ECHOCARDIOGRAM LIMITED  Result Date: 09/14/2020    ECHOCARDIOGRAM LIMITED REPORT   Patient Name:   DAVEREIGH SPAINHOWERDate of Exam: 09/14/2020 Medical Rec #:  [id number]     Height:       67.0 in Accession #:    [id number]    Weight:       190.7 lb Date of Birth:  2January 14, 1982     BSA:          1.982 m Patient Age:    89years       BP:           110/66 mmHg Patient Gender: F               HR:           90 bpm. Exam Location:  [facility] Procedure: Limited Echo, Limited Color Doppler and Cardiac Doppler Indications:     v58.11 Chemotherapy evaluation  History:         Patient has prior history of Echocardiogram examinations, most                  recent 01/29/2020. Cardiomyopathy; COPD.  Sonographer:     JCharmayne SheerRDCS (AE) Referring Phys:  [id number] Phys: KBartholome BillMD  Sonographer Comments: Technically difficult study due to poor echo  windows. Image acquisition challenging due to mastectomy. IMPRESSIONS  1. Left ventricular ejection fraction, by estimation, is 60 to 65%. Left ventricular ejection fraction by PLAX is 68 %. The left ventricle has normal function. Left ventricular diastolic parameters are consistent with Grade I diastolic dysfunction (impaired relaxation).  2. Right ventricular systolic function is normal. The right ventricular size is normal.  3. The mitral valve was not well visualized. No evidence of mitral valve regurgitation.  4. Aortic valve regurgitation is not visualized. FINDINGS  Left Ventricle: Left ventricular ejection fraction, by estimation, is 60 to 65%. Left ventricular ejection fraction by PLAX is 68 %. The left ventricle has normal function. The left ventricular internal cavity size was normal in size. There is borderline left ventricular hypertrophy. Left ventricular diastolic parameters are consistent with Grade I diastolic dysfunction (impaired relaxation). Right Ventricle: The right ventricular size is normal. Right ventricular systolic function is normal. Left Atrium: Left atrial size was normal in size. Right Atrium: Right atrial size was normal in size. Pericardium: There is no evidence of pericardial effusion. Mitral Valve: The mitral valve was not well visualized. Tricuspid Valve: The tricuspid valve is not well visualized. Tricuspid valve regurgitation is not demonstrated. Aortic Valve: Aortic valve regurgitation is not visualized. Pulmonic  Valve: The pulmonic valve was not well visualized. Pulmonic valve regurgitation is trivial. Aorta: The aortic root is normal in size and structure. IAS/Shunts: The interatrial septum was not well visualized. LEFT VENTRICLE PLAX 2D LV EF:         Left ventricular ejection fraction by PLAX is 68 %. LVIDd:         4.75 cm LVIDs:         2.95 cm LV PW:         0.88 cm LV IVS:        0.61 cm LVOT diam:     2.00 cm LVOT Area:     3.14 cm  LEFT ATRIUM         Index LA diam:    2.80 cm 1.41 cm/m                        PULMONIC VALVE AORTA                 PV Vmax:       0.94 m/s Ao Root diam: 2.90 cm PV Vmean:      62.500 cm/s                       PV VTI:        0.173 m                       PV Peak grad:  3.5 mmHg                       PV Mean grad:  2.0 mmHg   SHUNTS Systemic Diam: 2.00 cm Bartholome Bill MD Electronically signed by Bartholome Bill MD Signature Date/Time: 09/14/2020/12:03:33 PM    Final         Assessment and plan- Patient is a 39 y.o. female presents for evaluation of newly diagnosed multicentric right breast cancer.  Cancer Staging Breast cancer of upper-outer quadrant of right female breast [facility]) Staging form: Breast, AJCC 8th Edition - Clinical: G2, ER+, PR+, HER2- - Signed by Earlie Server, MD on 04/05/2018 - Pathologic stage from 04/04/2018: No Stage Recommended (  ypT3, pN2, cM0, G3, ER+, PR-, HER2-) - Signed by Earlie Server, MD on 04/04/2018    1. Metastatic breast cancer ([place])   2. HER2-positive carcinoma of breast ([place])   3. Encounter for antineoplastic chemotherapy   4. Neuropathy due to chemotherapeutic drug ([place])   5. Bone metastasis ([place])   6. Hypocalcemia   Cancer Staging Breast cancer of upper-outer quadrant of right female breast ([place]) Staging form: Breast, AJCC 8th Edition - Clinical: G2, ER+, PR+, HER2- - Signed by Earlie Server, MD on 04/05/2018 - Pathologic stage from 04/04/2018: No Stage Recommended (ypT3, pN2, cM0, G3, ER+, PR-, HER2-) - Signed by Earlie Server, MD on 04/04/2018  Breast  cancer with isolated bone metastasis, stage IV #Initially stage IIIA right breast cancer, ER positive, HER-2 negative status post bilateral oophorectomy, Right mastectomy and right axillary lymph node dissection, status post implant, and implant removal.  Status post left mastectomy and axillary sentinel lymph node biopsy. developed stage IV disease with biopsy-proven thoracic spine bone metastasis. Estrogen positive, HER-2 positive breast cancer. Status post radiation to T7 bone metastasis.   Labs are reviewed and discussed with patient.  -Continue fulvestrant monthly -Proceed with trastuzumab and Pertuzumab treatment today. -PET scan in December for evaluation of treatment response.  #Chemotherapy-induced diarrhea, Imodium as instructed. #Bone metastasis,  Continue Zometa monthly. # Back pain, monitor symptoms. Pain regimen PRN.  #Chemotherapy-induced neuropathy, continue Lyrica and nortriptyline.  #Depression.  Continue Lexapro 20 mg daily. #Hand swelling, pre-existing condition.  Probably due to chronic inflammation from arthritis.  trial of Lasix 20 mg daily PRN did not work. Discontinue.  # Hypokalemia, increase potassium rich food.  # Hypocalcemia, she only take 1 calcium/vit d. Discussed and advise her to increase to 2 tablets daily.     Follow-up in 3 weeks for the next cycle of trastuzumab and Pertuzumab treatments.  Patient knows to call clinic if she experiences concerning side effects  Earlie Server, MD, PhD Hematology Oncology [place] at [facility] 11/19/20

## 2020-11-24 ENCOUNTER — Telehealth: Payer: Self-pay | Admitting: *Deleted

## 2020-11-24 NOTE — Telephone Encounter (Signed)
PET approved Pt was made aware of her sched 12/03/20 PET scan @ [place] NPO 6hrs prior, must arrived by 1pm.

## 2020-12-03 ENCOUNTER — Ambulatory Visit
Admission: RE | Admit: 2020-12-03 | Discharge: 2020-12-03 | Disposition: A | Discharge status: Home / Self Care | Payer: Medicare HMO | Source: Ambulatory Visit | Attending: Oncology | Admitting: Oncology

## 2020-12-03 ENCOUNTER — Other Ambulatory Visit: Payer: Self-pay

## 2020-12-03 DIAGNOSIS — C50919 Malignant neoplasm of unspecified site of unspecified female breast: Secondary | ICD-10-CM

## 2020-12-03 DIAGNOSIS — Z170 Estrogen receptor positive status [ER+]: Secondary | ICD-10-CM | POA: Insufficient documentation

## 2020-12-03 DIAGNOSIS — C50411 Malignant neoplasm of upper-outer quadrant of right female breast: Secondary | ICD-10-CM

## 2020-12-03 LAB — GLUCOSE, CAPILLARY: Glucose-Capillary: 95 mg/dL (ref 70–99)

## 2020-12-03 IMAGING — CT NM PET TUM IMG RESTAG (PS) SKULL BASE T - THIGH
1 of 9 series · 2 of 25 positions shown · non-contrast
Comparison: PET-CT scan [DATE]
COMPARISON: PET-CT scan [DATE]

Addendum:
CLINICAL DATA: Subsequent treatment strategy for breast carcinoma.

EXAM:
NUCLEAR MEDICINE PET SKULL BASE TO THIGH
TECHNIQUE: 10.9 mCi F-18 FDG was injected intravenously. Full-ring PET imaging
was performed from the skull base to thigh after the radiotracer. CT
data was obtained and used for attenuation correction and anatomic
localization.
Fasting blood glucose: 98 mg/dl

[Series 3: ct wb 5.0 b30f · axial · 5.0mm · 0.98mm/px · z∈[+252,+688]mm · 2 of 290 slices shown]
[im 145/290  brain]
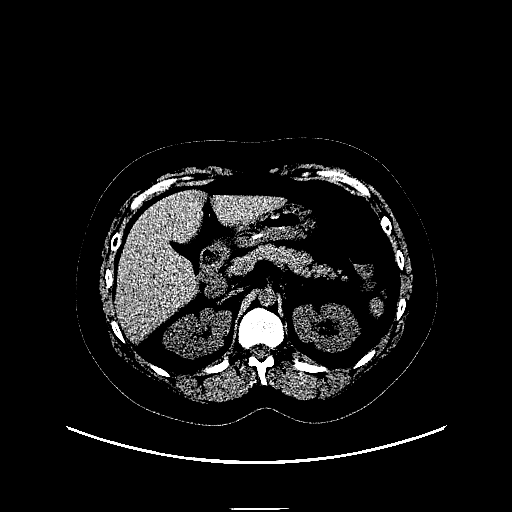
[im 290/290  brain]
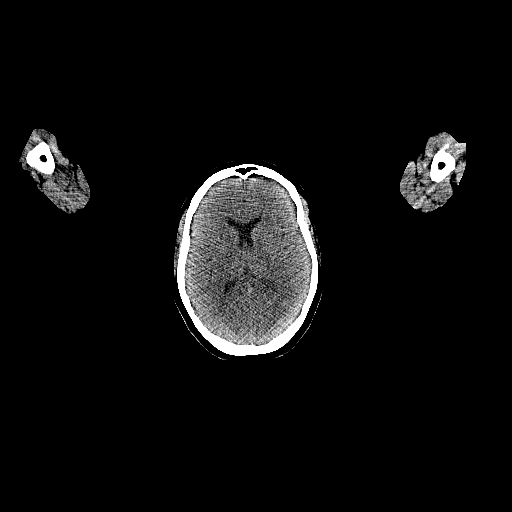

[2 of 25 positions shown; findings below may reference images not displayed]

FINDINGS: Mediastinal blood pool activity: SUV max

Liver activity: SUV max NA

NECK: No hypermetabolic lymph nodes in the neck.

Incidental CT findings: none

CHEST: No hypermetabolic mediastinal or hilar nodes. No suspicious
pulmonary nodules on the CT scan.

Incidental CT findings: none

ABDOMEN/PELVIS: No abnormal hypermetabolic activity within the
liver, pancreas, adrenal glands, or spleen. No hypermetabolic lymph
nodes in the abdomen or pelvis.

Incidental CT findings: none

SKELETON: Resolution hypermetabolic activity at the T7 vertebral
body. There is minimal perispinal musculature activity. No new
skeletal lesions.

Incidental CT findings: none
IMPRESSION: 1. Resolution of metabolic activity in the T7 vertebral body
metastasis.
2. Paraspinal musculature uptake at T7 related to treatment.
3. No evidence of new or progressive breast cancer metastasis.

ADDENDUM:
Mild para spinal muscular uptake noted on subsequent FDG PET scan
[DATE]. There is a very subtle uptake in the paraspinal
musculature on PET/CT exam [DATE] at the T7 vertebral body level
presumed related to mild benign post treatment inflammation with SUV
max equal 3.5.

*** End of Addendum ***
FINDINGS: Mediastinal blood pool activity: SUV max

Liver activity: SUV max NA

NECK: No hypermetabolic lymph nodes in the neck.

Incidental CT findings: none

CHEST: No hypermetabolic mediastinal or hilar nodes. No suspicious
pulmonary nodules on the CT scan.

Incidental CT findings: none

ABDOMEN/PELVIS: No abnormal hypermetabolic activity within the
liver, pancreas, adrenal glands, or spleen. No hypermetabolic lymph
nodes in the abdomen or pelvis.

Incidental CT findings: none

SKELETON: Resolution hypermetabolic activity at the T7 vertebral
body. There is minimal perispinal musculature activity. No new
skeletal lesions.

Incidental CT findings: none
IMPRESSION: 1. Resolution of metabolic activity in the T7 vertebral body
metastasis.
2. Paraspinal musculature uptake at T7 related to treatment.
3. No evidence of new or progressive breast cancer metastasis.

## 2020-12-03 MED ORDER — FLUDEOXYGLUCOSE F - 18 (FDG) INJECTION
10.0000 | Freq: Once | INTRAVENOUS | Status: AC | PRN
  Administered 2020-12-03: 10.92 via INTRAVENOUS

## 2020-12-07 ENCOUNTER — Encounter: Payer: Self-pay | Admitting: Oncology

## 2020-12-10 ENCOUNTER — Inpatient Hospital Stay (HOSPITAL_BASED_OUTPATIENT_CLINIC_OR_DEPARTMENT_OTHER): Payer: Medicare HMO | Admitting: Oncology

## 2020-12-10 ENCOUNTER — Encounter: Payer: Self-pay | Admitting: Oncology

## 2020-12-10 ENCOUNTER — Inpatient Hospital Stay: Payer: Medicare HMO

## 2020-12-10 VITALS — BP 116/76 | HR 103 | Temp 97.4°F | Resp 16 | Wt 192.4 lb

## 2020-12-10 DIAGNOSIS — Z5111 Encounter for antineoplastic chemotherapy: Secondary | ICD-10-CM | POA: Diagnosis not present

## 2020-12-10 DIAGNOSIS — Z170 Estrogen receptor positive status [ER+]: Secondary | ICD-10-CM

## 2020-12-10 DIAGNOSIS — C50411 Malignant neoplasm of upper-outer quadrant of right female breast: Secondary | ICD-10-CM

## 2020-12-10 DIAGNOSIS — C50919 Malignant neoplasm of unspecified site of unspecified female breast: Secondary | ICD-10-CM

## 2020-12-10 DIAGNOSIS — C50811 Malignant neoplasm of overlapping sites of right female breast: Secondary | ICD-10-CM

## 2020-12-10 DIAGNOSIS — Z5112 Encounter for antineoplastic immunotherapy: Secondary | ICD-10-CM | POA: Insufficient documentation

## 2020-12-10 LAB — CBC WITH DIFFERENTIAL/PLATELET
Abs Immature Granulocytes: 0.03 10*3/uL (ref 0.00–0.07)
Basophils Absolute: 0.1 10*3/uL (ref 0.0–0.1)
Basophils Relative: 1 %
Eosinophils Absolute: 0.1 10*3/uL (ref 0.0–0.5)
Eosinophils Relative: 2 %
HCT: 32.7 % — ABNORMAL LOW (ref 36.0–46.0)
Hemoglobin: 11.3 g/dL — ABNORMAL LOW (ref 12.0–15.0)
Immature Granulocytes: 0 %
Lymphocytes Relative: 22 %
Lymphs Abs: 1.9 10*3/uL (ref 0.7–4.0)
MCH: 31 pg (ref 26.0–34.0)
MCHC: 34.6 g/dL (ref 30.0–36.0)
MCV: 89.6 fL (ref 80.0–100.0)
Monocytes Absolute: 0.8 10*3/uL (ref 0.1–1.0)
Monocytes Relative: 9 %
Neutro Abs: 5.6 10*3/uL (ref 1.7–7.7)
Neutrophils Relative %: 66 %
Platelets: 331 10*3/uL (ref 150–400)
RBC: 3.65 MIL/uL — ABNORMAL LOW (ref 3.87–5.11)
RDW: 13.1 % (ref 11.5–15.5)
WBC: 8.4 10*3/uL (ref 4.0–10.5)
nRBC: 0 % (ref 0.0–0.2)

## 2020-12-10 LAB — COMPREHENSIVE METABOLIC PANEL
ALT: 17 U/L (ref 0–44)
AST: 25 U/L (ref 15–41)
Albumin: 4 g/dL (ref 3.5–5.0)
Alkaline Phosphatase: 70 U/L (ref 38–126)
Anion gap: 12 (ref 5–15)
BUN: 15 mg/dL (ref 6–20)
CO2: 19 mmol/L — ABNORMAL LOW (ref 22–32)
Calcium: 8.2 mg/dL — ABNORMAL LOW (ref 8.9–10.3)
Chloride: 106 mmol/L (ref 98–111)
Creatinine, Ser: 0.72 mg/dL (ref 0.44–1.00)
GFR, Estimated: >60 mL/min (ref >60)
Glucose, Bld: 119 mg/dL — ABNORMAL HIGH (ref 70–99)
Potassium: 3 mmol/L — ABNORMAL LOW (ref 3.5–5.1)
Sodium: 137 mmol/L (ref 135–145)
Total Bilirubin: 0.5 mg/dL (ref 0.3–1.2)
Total Protein: 7 g/dL (ref 6.5–8.1)

## 2020-12-10 MED ORDER — SODIUM CHLORIDE 0.9 % IV SOLN
420.0000 mg | Freq: Once | INTRAVENOUS | Status: AC
  Administered 2020-12-10: 420 mg via INTRAVENOUS
  Filled 2020-12-10: qty 14

## 2020-12-10 MED ORDER — TRASTUZUMAB-ANNS CHEMO 150 MG IV SOLR
6.0000 mg/kg | Freq: Once | INTRAVENOUS | Status: AC
  Administered 2020-12-10: 504 mg via INTRAVENOUS
  Filled 2020-12-10: qty 24

## 2020-12-10 MED ORDER — SODIUM CHLORIDE 0.9 % IV SOLN
Freq: Once | INTRAVENOUS | Status: AC
Start: 2020-12-10 — End: 2020-12-10
  Filled 2020-12-10: qty 250

## 2020-12-10 MED ORDER — HEPARIN SOD (PORK) LOCK FLUSH 100 UNIT/ML IV SOLN
500.0000 [IU] | Freq: Once | INTRAVENOUS | Status: AC | PRN
  Administered 2020-12-10: 500 [IU]
  Filled 2020-12-10: qty 5

## 2020-12-10 MED ORDER — ACETAMINOPHEN 325 MG PO TABS
650.0000 mg | ORAL_TABLET | Freq: Once | ORAL | Status: AC
  Administered 2020-12-10: 650 mg via ORAL
  Filled 2020-12-10: qty 2

## 2020-12-10 MED ORDER — POTASSIUM CHLORIDE ER 10 MEQ PO TBCR: 10.0000 meq | EXTENDED_RELEASE_TABLET | ORAL | 0 refills | Status: DC

## 2020-12-10 MED ORDER — OXYCODONE HCL 5 MG PO TABS: 5.0000 mg | ORAL_TABLET | Freq: Four times a day (QID) | ORAL | 0 refills | Status: DC | PRN

## 2020-12-10 MED ORDER — DIPHENHYDRAMINE HCL 25 MG PO CAPS
50.0000 mg | ORAL_CAPSULE | Freq: Once | ORAL | Status: AC
Start: 2020-12-10 — End: 2020-12-10
  Administered 2020-12-10: 50 mg via ORAL
  Filled 2020-12-10: qty 2

## 2020-12-10 MED ORDER — HEPARIN SOD (PORK) LOCK FLUSH 100 UNIT/ML IV SOLN
INTRAVENOUS | Status: AC
  Filled 2020-12-10: qty 5

## 2020-12-10 NOTE — Progress Notes (Signed)
Hematology/Oncology Follow Up Note [place] [phone] [phone]  Patient Care Team: Marguerita Merles, MD as PCP - General (Family Medicine) End, Harrell Gave, MD as PCP - Cardiology (Cardiology) Rico Junker, RN as Oncology Nurse Navigator Earlie Server, MD as Consulting Physician (Oncology) Bary Castilla, Forest Gleason, MD (General Surgery) Noreene Filbert, MD as Referring Physician (Radiation Oncology) Noreene Filbert, MD as Radiation Oncologist (Radiation Oncology) Noreene Filbert, MD as Radiation Oncologist (Radiation Oncology)   Name of the patient: Theresa Chaney  [id number]  Mar 25, 1981   Date of visit: 12/10/20 REASON FOR VISIT Follow up for Assessment prior to chemotherapy treatment of breast cancer  Oncology History Cancer TREATMENT Neoadjuvant ddAC +one dose of Taxol, due to lack of response, surgery was offered. Case was discussed on breast tumor board. 03/19/2018 S/p right mastectomy and right axillary dissection, immediate breast reconstruction with placement of expanders.  ypT3 ypN2, + lymphovascular invasion,  Grade 3, margin is negative, close. ER 90%, PR 0%, HER2 IHC negative.  Also had elective bilateral salpingo-oophorectomy..  # 06/11/2018 s/p 11 cycles Taxol adjuvantly. Tolerated well.  # She has obtained dental clearance for starting Zometa.  S/p Zometa on 6/3/ 2019 # s/p adjuvant right chest wall radiation, finished 10/10/2018 #06/03/2019 underwent elective left prophylactic mastectomy and sentinel lymph node biopsy of left axilla.  # 06/03/2019 underwent elective left prophylactic mastectomy and sentinel lymph node biopsy of left axilla. Pathology negative for malignancy. Status post Mediport removal on 06/03/2019. She also underwent right implant removal on 06/03/2019. She also underwent right implant removal on 06/03/2019. #Negative genetic testing  #  chemotherapy-induced neuropathy, bilateral fingertips and lower extremities.   Patient is on Lyrica and nortriptyline.  Follows up with neurology. She follows up with lymphedema clinic.  #  07/07/2020, MRI thoracic spine without contrast showed lesions involving the T7 posterior elements most concerning for metastatic lesion.  No evidence of epidural tumor.  Minimal thoracic spondylosis without stenosis. MRI was reviewed by me and a PET scan was obtained for further evaluation. 07/20/2020, PET scan showed hypermetabolic metastasis involving the posterior element of T7, no additional evidence of metastasis in the neck, chest, abdomen or pelvis.  # 07/29/2020 T7 lesion biopsy showed metastatic carcinoma, compatible with breast origin.  Receptor status staining showed ER 71-80% positive, PR negative, HER-2 positive IHC 3+ # Patient finished spine radiation on 08/31/2020   INTERVAL HISTORY 39 yo female with above oncology history reviewed by me presents for follow-up of management of metastatic breast cancer.  Back pain has significantly improved.  Occasionally she has some discomfort Diarrhea symptoms are stable, controlled with Imodium. Today she reports back pain, same as previously. 7 out of 10, she wants pain medication to be refilled. She is on monthly fulvestrant and zometa infusion.    Review of Systems  Constitutional: Negative for chills, fever, malaise/fatigue and weight loss.  HENT: Negative for sore throat.   Eyes: Negative for redness.  Respiratory: Negative for cough, shortness of breath and wheezing.   Cardiovascular: Negative for chest pain, palpitations and leg swelling.  Gastrointestinal: Negative for abdominal pain, blood in stool, heartburn, nausea and vomiting.  Genitourinary: Negative for dysuria.  Musculoskeletal: Positive for joint pain. Negative for back pain and myalgias.       Hand swelling  Skin: Negative for rash.  Neurological: Positive for tingling. Negative for dizziness and tremors.  Endo/Heme/Allergies: Does not bruise/bleed easily.   Psychiatric/Behavioral: Negative for hallucinations.    No Known Allergies  Patient Active Problem  List   Diagnosis Date Noted  . Encounter for monoclonal antibody treatment for malignancy 12/10/2020  . Bone lesion 11/19/2020  . Hypocalcemia 11/19/2020  . Inflammatory arthritis 11/19/2020  . Encounter for antineoplastic chemotherapy 10/29/2020  . Metastatic breast cancer ([place]) 08/31/2020  . Goals of care, counseling/discussion 08/11/2020  . Neuropathy due to chemotherapeutic drug ([place]) 08/11/2020  . HER2-positive carcinoma of breast ([place]) 08/11/2020  . Rheumatoid arteritis ([place]) 10/13/2019  . Bilateral hand swelling 10/03/2019  . Fracture of neck of metacarpal bone 05/14/2019  . Chronic fatigue 04/09/2019  . Polyarthralgia 04/09/2019  . Status post right breast reconstruction 02/26/2019  . Status post right mastectomy 02/26/2019  . Mastalgia 02/15/2019  . Shortness of breath 08/23/2018  . Nonischemic cardiomyopathy ([place]) 08/23/2018  . Preprocedural cardiovascular examination 08/23/2018  . Tachycardia 08/23/2018  . Palpitations 08/23/2018  . Estrogen receptor positive status (ER+) 04/04/2018  . Acquired absence of right breast and nipple 04/03/2018  . Breast cancer of upper-outer quadrant of right female breast ([place]) 03/19/2018  . Family history of cancer   . Malignant neoplasm of overlapping sites of right breast in female, estrogen receptor positive ([place]) 10/19/2017  . Gastroesophageal reflux disease without esophagitis 02/24/2017  . Generalized anxiety disorder 10/03/2014  . Headache 10/03/2014     Past Medical History:  Diagnosis Date  . Anemia   . Arthritis   . BRCA negative 11/26/2017  . Breast cancer ([place]) 10/11/2017   Multifocal, ER positive, PR negative, HER-2 negative. ypT3 ypN2a 8.7 cm, 4/15 nodes  . Cardiomyopathy ([place])    a. 10/2017 Echo: EF 60-65%, no rwma, Gr1 DD, nl RV size/fxn; b. 04/2018 Echo: EF 55-60%, no rwma, Nl RV size/fxn; c. 08/2018 Echo: EF  45%, diff HK, ? HK of antsept wall. Gr1 DD. Mild MR. Mild LAE/RAE. Mod dil RV.   [name] Chronic bronchitis ([place]) 11/2017  . COPD (chronic obstructive pulmonary disease) ([place])    MILD PER CXR  . Depression   . Family history of cancer   . GERD (gastroesophageal reflux disease)   . Headache    MIGRAINES  . Heart murmur    ASYMPTOMATIC  . Personal history of chemotherapy    current for right breast ca     Past Surgical History:  Procedure Laterality Date  . AXILLARY LYMPH NODE DISSECTION Right 03/19/2018   Procedure: AXILLARY LYMPH NODE DISSECTION;  Surgeon: Robert Bellow, MD;  Location: [facility] ORS;  Service: General;  Laterality: Right;  . BREAST BIOPSY Right 10/11/2017   12:30 posterior coil clip invasive mammary carcinoma  . BREAST BIOPSY Right 10/11/2017   11:30 middle depth ribbon clip DCIS  . BREAST BIOPSY Right 10/11/2017   5:30 anterior depth x shape invasive ductal carcinoma  . BREAST IMPLANT REMOVAL Right 06/03/2019   Procedure: REMOVAL OF RIGHT BREAST IMPLANTS;  Surgeon: Wallace Going, DO;  Location: [facility];  Service: Plastics;  Laterality: Right;  . BREAST RECONSTRUCTION WITH PLACEMENT OF TISSUE EXPANDER AND FLEX HD (ACELLULAR HYDRATED DERMIS) Right 03/19/2018   Procedure: BREAST RECONSTRUCTION WITH PLACEMENT OF TISSUE EXPANDER AND FLEX HD (ACELLULAR HYDRATED DERMIS);  Surgeon: Wallace Going, DO;  Location: [facility];  Service: Plastics;  Laterality: Right;  . CARPAL TUNNEL RELEASE Bilateral 2020  . CHOLECYSTECTOMY N/A 04/27/2020   Procedure: LAPAROSCOPIC CHOLECYSTECTOMY WITH INTRAOPERATIVE CHOLANGIOGRAM;  Surgeon: Robert Bellow, MD;  Location: [facility];  Service: General;  Laterality: N/A;  . ESOPHAGOGASTRODUODENOSCOPY (EGD) WITH PROPOFOL N/A 04/17/2020   Procedure: ESOPHAGOGASTRODUODENOSCOPY (EGD) WITH PROPOFOL;  Surgeon: Bary Castilla,  Forest Gleason, MD;  Location: [facility];  Service: Endoscopy;  Laterality: N/A;  with biopsy  . LAPAROSCOPIC BILATERAL SALPINGO  OOPHERECTOMY Bilateral 03/19/2018   Procedure: LAPAROSCOPIC BILATERAL SALPINGO OOPHORECTOMY;  Surgeon: Benjaman Kindler, MD;  Location: [facility];  Service: Gynecology;  Laterality: Bilateral;  . MASTECTOMY Right 03/2018  . MASTECTOMY W/ SENTINEL NODE BIOPSY Right 03/19/2018   Procedure: MASTECTOMY WITH SENTINEL LYMPH NODE BIOPSY;  Surgeon: Robert Bellow, MD;  Location: [facility];  Service: General;  Laterality: Right;  . PORT-A-CATH REMOVAL Left 06/03/2019   Procedure: REMOVAL PORT-A-CATH;  Surgeon: Robert Bellow, MD;  Location: [facility];  Service: General;  Laterality: Left;  . PORTACATH PLACEMENT Left 10/24/2017   Procedure: INSERTION PORT-A-CATH;  Surgeon: Robert Bellow, MD;  Location: [facility];  Service: General;  Laterality: Left;  . PORTACATH PLACEMENT Right 09/28/2020   Procedure: INSERTION PORT-A-CATH;  Surgeon: Robert Bellow, MD;  Location: [facility];  Service: General;  Laterality: Right;  . REMOVAL OF TISSUE EXPANDER AND PLACEMENT OF IMPLANT Right 07/20/2018   Procedure: REMOVAL OF RIGHT BREAST TISSUE EXPANDER AND PLACEMENT OF IMPLANT;  Surgeon: Wallace Going, DO;  Location: [facility];  Service: Plastics;  Laterality: Right;  . SIMPLE MASTECTOMY WITH AXILLARY SENTINEL NODE BIOPSY Left 06/03/2019   Procedure: SIMPLE MASTECTOMY LEFT;  Surgeon: Robert Bellow, MD;  Location: [facility];  Service: General;  Laterality: Left;    Social History   Socioeconomic History  . Marital status: Married    Spouse name: Not on file  . Number of children: Not on file  . Years of education: Not on file  . Highest education level: Not on file  Occupational History  . Occupation: Occupational psychologist    Comment: Therapist, art   Tobacco Use  . Smoking status: Current Every Day Smoker    Packs/day: 0.50    Years: 18.00    Pack years: 9.00    Types: Cigarettes    Start date: 06/21/2018  . Smokeless tobacco: Never Used  Vaping Use  . Vaping  Use: Never used  Substance and Sexual Activity  . Alcohol use: No  . Drug use: No  . Sexual activity: Yes    Birth control/protection: Injection, Other-see comments    Comment: has had hysterectomy  Other Topics Concern  . Not on file  Social History Narrative   Lives at home with husband and daughter   Social Determinants of Health   Financial Resource Strain: Not on file  Food Insecurity: Not on file  Transportation Needs: Not on file  Physical Activity: Not on file  Stress: Not on file  Social Connections: Not on file  Intimate Partner Violence: Not on file     Family History  Problem Relation Age of Onset  . Melanoma Maternal Aunt        other aunts with BCC/SCC/Melanoma  . Diabetes Father   . Hypertension Father   . Hyperlipidemia Father   . Heart attack Father 22       "mild"  . Bladder Cancer Maternal Grandmother   . Cervical cancer Maternal Aunt 64       daughter w/ cervical cancer as well  . Melanoma Maternal Uncle        other uncles with BCC/SCC/Melanoma     Current Outpatient Medications:  .  acetaminophen (TYLENOL) 500 MG tablet, Take 500 mg by mouth every 6 (six) hours as needed., Disp: , Rfl:  .  albuterol (VENTOLIN  HFA) 108 (90 Base) MCG/ACT inhaler, Inhale 2 puffs into the lungs every 6 (six) hours as needed for wheezing or shortness of breath. , Disp: , Rfl:  .  Calcium Carbonate-Vitamin D3 (CALCIUM 600/VITAMIN D) 600-400 MG-UNIT TABS, Take 2 tablets by mouth daily., Disp: 180 tablet, Rfl: 0 .  cholecalciferol (VITAMIN D3) 25 MCG (1000 UNIT) tablet, Take 1 tablet (1,000 Units total) by mouth daily., Disp: 90 tablet, Rfl: 0 .  cyanocobalamin (,VITAMIN B-12,) 1000 MCG/ML injection, Inject 1,000 mcg into the skin every 30 (thirty) days., Disp: , Rfl:  .  cyclobenzaprine (FLEXERIL) 5 MG tablet, Take 5 mg by mouth 3 (three) times daily as needed for muscle spasms. , Disp: , Rfl:  .  diphenoxylate-atropine (LOMOTIL) 2.5-0.025 MG tablet, Take 1 tablet by  mouth 4 (four) times daily as needed for diarrhea or loose stools., Disp: 60 tablet, Rfl: 0 .  escitalopram (LEXAPRO) 20 MG tablet, Take 20 mg by mouth daily. , Disp: , Rfl:  .  esomeprazole (NEXIUM) 40 MG capsule, Take 40 mg by mouth daily before breakfast. , Disp: , Rfl:  .  Eszopiclone 3 MG TABS, Take 3 mg by mouth at bedtime as needed (sleep). , Disp: , Rfl:  .  folic acid (FOLVITE) 1 MG tablet, Take 1 mg by mouth daily., Disp: , Rfl:  .  furosemide (LASIX) 20 MG tablet, Take 1 tablet (20 mg total) by mouth daily as needed. (Patient not taking: Reported on 11/19/2020), Disp: 7 tablet, Rfl: 0 .  ibuprofen (ADVIL) 800 MG tablet, Take 800 mg by mouth every 8 (eight) hours as needed for moderate pain. , Disp: , Rfl:  .  lidocaine-prilocaine (EMLA) cream, Apply to port and cover 1-2 hours prior to appointment, Disp: 30 g, Rfl: 3 .  loperamide (IMODIUM) 2 MG capsule, Take 2 tablets with onset of diarrhea, then take 1 tablet every 2 hours until diarrhea stops. Maximum 8 tablets in 24hours, Disp: 30 capsule, Rfl: 3 .  loratadine (CLARITIN) 10 MG tablet, Take 10 mg by mouth daily. , Disp: , Rfl:  .  LORazepam (ATIVAN) 1 MG tablet, Take 1 mg by mouth 3 (three) times daily., Disp: , Rfl:  .  magic mouthwash SOLN, Take 5 mLs by mouth 4 (four) times daily. (Patient not taking: No sig reported), Disp: 480 mL, Rfl: 1 .  Magnesium 400 MG TABS, Take 400 mg by mouth 2 (two) times daily., Disp: , Rfl:  .  meloxicam (MOBIC) 7.5 MG tablet, Take 7.5 mg by mouth 2 (two) times daily as needed (pain/inflammation.). , Disp: , Rfl:  .  methotrexate (RHEUMATREX) 2.5 MG tablet, Take 20 mg by mouth every Sunday. , Disp: , Rfl:  .  metoprolol succinate (TOPROL-XL) 25 MG 24 hr tablet, Take 37.5 mg by mouth every morning. , Disp: , Rfl:  .  nortriptyline (PAMELOR) 10 MG capsule, Take 30 mg by mouth at bedtime. , Disp: , Rfl:  .  oxyCODONE (OXY IR/ROXICODONE) 5 MG immediate release tablet, Take 1-2 tablets (5-10 mg total) by  mouth every 6 (six) hours as needed for moderate pain or severe pain., Disp: 60 tablet, Rfl: 0 .  oxyCODONE ER (XTAMPZA ER) 13.5 MG C12A, Take 13.5 mg by mouth every 12 (twelve) hours., Disp: 14 capsule, Rfl: 0 .  phentermine (ADIPEX-P) 37.5 MG tablet, Take 37.5 mg by mouth daily before breakfast. , Disp: , Rfl:  .  pregabalin (LYRICA) 150 MG capsule, Take 150 mg by mouth 2 (two) times daily., Disp: ,  Rfl:  .  promethazine (PHENERGAN) 25 MG tablet, Take 1 tablet (25 mg total) by mouth every 8 (eight) hours as needed for nausea or vomiting., Disp: 90 tablet, Rfl: 0 .  pyridOXINE (VITAMIN B-6) 100 MG tablet, Take 100 mg by mouth daily., Disp: , Rfl:  .  sucralfate (CARAFATE) 1 g tablet, Take 1 tablet (1 g total) by mouth 3 (three) times daily. Dissolve in 3-4 tbsp warm water, swish and swallow., Disp: 90 tablet, Rfl: 1 .  topiramate (TOPAMAX) 50 MG tablet, Take 50 mg by mouth 2 (two) times daily. , Disp: , Rfl:  .  vitamin C (ASCORBIC ACID) 500 MG tablet, Take 500 mg by mouth daily., Disp: , Rfl:  No current facility-administered medications for this visit.  Facility-Administered Medications Ordered in Other Visits:  .  heparin lock flush 100 unit/mL, 500 Units, Intravenous, Once, Earlie Server, MD .  sodium chloride flush (NS) 0.9 % injection 10 mL, 10 mL, Intravenous, PRN, Earlie Server, MD, 10 mL at 11/19/18 1249 .  sodium chloride flush (NS) 0.9 % injection 10 mL, 10 mL, Intravenous, Once, Earlie Server, MD   Physical exam:  Vitals:   12/10/20 0828  BP: 116/76  Pulse: (!) 103  Resp: 16  Temp: (!) 97.4 F (36.3 C)  Weight: 192 lb 6.4 oz (87.3 kg)  ECOG 1 Physical Exam Constitutional:      General: She is not in acute distress.    Appearance: She is not diaphoretic.  HENT:     Head: Normocephalic and atraumatic.     Nose: Nose normal.     Mouth/Throat:     Pharynx: No oropharyngeal exudate.  Eyes:     General: No scleral icterus.       Left eye: No discharge.     Conjunctiva/sclera:  Conjunctivae normal.     Pupils: Pupils are equal, round, and reactive to light.  Neck:     Vascular: No JVD.  Cardiovascular:     Rate and Rhythm: Normal rate and regular rhythm.     Heart sounds: Normal heart sounds. No murmur heard.   Pulmonary:     Effort: Pulmonary effort is normal. No respiratory distress.     Breath sounds: Normal breath sounds. No wheezing or rales.  Chest:     Chest wall: No tenderness.  Abdominal:     General: Bowel sounds are normal. There is no distension.     Palpations: Abdomen is soft. There is no mass.     Tenderness: There is no abdominal tenderness. There is no rebound.  Musculoskeletal:        General: Normal range of motion.     Cervical back: Normal range of motion and neck supple.  Lymphadenopathy:     Cervical: No cervical adenopathy.  Skin:    General: Skin is warm and dry.     Findings: No erythema or rash.  Neurological:     Mental Status: She is alert and oriented to person, place, and time.     Cranial Nerves: No cranial nerve deficit.     Motor: No abnormal muscle tone.     Coordination: Coordination normal.  Psychiatric:        Mood and Affect: Affect normal.           CMP Latest Ref Rng & Units 11/19/2020  Glucose 70 - 99 mg/dL 111(H)  BUN 6 - 20 mg/dL 14  Creatinine 0.44 - 1.00 mg/dL 0.77  Sodium 135 - 145 mmol/L 138  Potassium 3.5 - 5.1 mmol/L 3.4(L)  Chloride 98 - 111 mmol/L 106  CO2 22 - 32 mmol/L 23  Calcium 8.9 - 10.3 mg/dL 8.4(L)  Total Protein 6.5 - 8.1 g/dL 7.1  Total Bilirubin 0.3 - 1.2 mg/dL 0.5  Alkaline Phos 38 - 126 U/L 79  AST 15 - 41 U/L 26  ALT 0 - 44 U/L 21   CBC Latest Ref Rng & Units 11/19/2020  WBC 4.0 - 10.5 K/uL 8.3  Hemoglobin 12.0 - 15.0 g/dL 11.1(L)  Hematocrit 36.0 - 46.0 % 32.9(L)  Platelets 150 - 400 K/uL 345   RADIOGRAPHIC STUDIES: I have personally reviewed the radiological images as listed and agreed with the findings in the report. NM PET Image Restage (PS) Skull Base to  Thigh  Result Date: 12/03/2020 CLINICAL DATA:  Subsequent treatment strategy for breast carcinoma. EXAM: NUCLEAR MEDICINE PET SKULL BASE TO THIGH TECHNIQUE: 10.9 mCi F-18 FDG was injected intravenously. Full-ring PET imaging was performed from the skull base to thigh after the radiotracer. CT data was obtained and used for attenuation correction and anatomic localization. Fasting blood glucose: 98 mg/dl COMPARISON:  PET-CT scan 07/20/2020 FINDINGS: Mediastinal blood pool activity: SUV max 2.54 Liver activity: SUV max NA NECK: No hypermetabolic lymph nodes in the neck. Incidental CT findings: none CHEST: No hypermetabolic mediastinal or hilar nodes. No suspicious pulmonary nodules on the CT scan. Incidental CT findings: none ABDOMEN/PELVIS: No abnormal hypermetabolic activity within the liver, pancreas, adrenal glands, or spleen. No hypermetabolic lymph nodes in the abdomen or pelvis. Incidental CT findings: none SKELETON: Resolution hypermetabolic activity at the T7 vertebral body. There is minimal perispinal musculature activity. No new skeletal lesions. Incidental CT findings: none IMPRESSION: 1. Resolution of metabolic activity in the T7 vertebral body metastasis. 2. Paraspinal musculature uptake at T7 related to treatment. 3. No evidence of new or progressive breast cancer metastasis. Electronically Signed   By: Suzy Bouchard M.D.   On: 12/03/2020 15:41   DG Chest Port 1 View  Result Date: 09/28/2020 CLINICAL DATA:  Status post Port-A-Cath insertion. EXAM: PORTABLE CHEST 1 VIEW COMPARISON:  06/10/2020 chest radiograph and prior. FINDINGS: Right chest wall Port-A-Cath tip overlies the upper atrium. No focal consolidation, pneumothorax or pleural effusion. No pulmonary edema. Prominence of the cardiomediastinal silhouette likely secondary to mild hypoinflation and AP technique. No acute osseous abnormality. IMPRESSION: Indwelling right chest wall Port-A-Cath.  No acute airspace disease. Electronically  Signed   By: Primitivo Gauze M.D.   On: 09/28/2020 15:58   DG C-Arm 1-60 Min-No Report  Result Date: 09/28/2020 Fluoroscopy was utilized by the requesting physician.  No radiographic interpretation.   ECHOCARDIOGRAM LIMITED  Result Date: 09/14/2020    ECHOCARDIOGRAM LIMITED REPORT   Patient Name:   AAYANA REINERTSEN Date of Exam: 09/14/2020 Medical Rec #:  [id number]      Height:       67.0 in Accession #:    [id number]     Weight:       190.7 lb Date of Birth:  1981-03-16      BSA:          1.982 m Patient Age:    39 years       BP:           110/66 mmHg Patient Gender: F              HR:           90 bpm. Exam Location:  [facility] Procedure: Limited Echo,  Limited Color Doppler and Cardiac Doppler Indications:     v58.11 Chemotherapy evaluation  History:         Patient has prior history of Echocardiogram examinations, most                  recent 01/29/2020. Cardiomyopathy; COPD.  Sonographer:     Charmayne Sheer RDCS (AE) Referring Phys:  [id number] [place] Diagnosing Phys: Bartholome Bill MD  Sonographer Comments: Technically difficult study due to poor echo windows. Image acquisition challenging due to mastectomy. IMPRESSIONS  1. Left ventricular ejection fraction, by estimation, is 60 to 65%. Left ventricular ejection fraction by PLAX is 68 %. The left ventricle has normal function. Left ventricular diastolic parameters are consistent with Grade I diastolic dysfunction (impaired relaxation).  2. Right ventricular systolic function is normal. The right ventricular size is normal.  3. The mitral valve was not well visualized. No evidence of mitral valve regurgitation.  4. Aortic valve regurgitation is not visualized. FINDINGS  Left Ventricle: Left ventricular ejection fraction, by estimation, is 60 to 65%. Left ventricular ejection fraction by PLAX is 68 %. The left ventricle has normal function. The left ventricular internal cavity size was normal in size. There is borderline left ventricular hypertrophy. Left  ventricular diastolic parameters are consistent with Grade I diastolic dysfunction (impaired relaxation). Right Ventricle: The right ventricular size is normal. Right ventricular systolic function is normal. Left Atrium: Left atrial size was normal in size. Right Atrium: Right atrial size was normal in size. Pericardium: There is no evidence of pericardial effusion. Mitral Valve: The mitral valve was not well visualized. Tricuspid Valve: The tricuspid valve is not well visualized. Tricuspid valve regurgitation is not demonstrated. Aortic Valve: Aortic valve regurgitation is not visualized. Pulmonic Valve: The pulmonic valve was not well visualized. Pulmonic valve regurgitation is trivial. Aorta: The aortic root is normal in size and structure. IAS/Shunts: The interatrial septum was not well visualized. LEFT VENTRICLE PLAX 2D LV EF:         Left ventricular ejection fraction by PLAX is 68 %. LVIDd:         4.75 cm LVIDs:         2.95 cm LV PW:         0.88 cm LV IVS:        0.61 cm LVOT diam:     2.00 cm LVOT Area:     3.14 cm  LEFT ATRIUM         Index LA diam:    2.80 cm 1.41 cm/m                        PULMONIC VALVE AORTA                 PV Vmax:       0.94 m/s Ao Root diam: 2.90 cm PV Vmean:      62.500 cm/s                       PV VTI:        0.173 m                       PV Peak grad:  3.5 mmHg                       PV Mean grad:  2.0 mmHg   SHUNTS Systemic Diam: 2.00 cm Chrissie Noa  Fath MD Electronically signed by Bartholome Bill MD Signature Date/Time: 09/14/2020/12:03:33 PM    Final         Assessment and plan- Patient is a 39 y.o. female presents for evaluation of newly diagnosed multicentric right breast cancer.  Cancer Staging Breast cancer of upper-outer quadrant of right female breast [facility]) Staging form: Breast, AJCC 8th Edition - Clinical: G2, ER+, PR+, HER2- - Signed by Earlie Server, MD on 04/05/2018 - Pathologic stage from 04/04/2018: No Stage Recommended (ypT3, pN2, cM0, G3, ER+, PR-, HER2-) -  Signed by Earlie Server, MD on 04/04/2018    1. Metastatic breast cancer ([place])   2. HER2-positive carcinoma of breast ([place])   3. Encounter for monoclonal antibody treatment for malignancy   Cancer Staging Breast cancer of upper-outer quadrant of right female breast ([place]) Staging form: Breast, AJCC 8th Edition - Clinical: G2, ER+, PR+, HER2- - Signed by Earlie Server, MD on 04/05/2018 - Pathologic stage from 04/04/2018: No Stage Recommended (ypT3, pN2, cM0, G3, ER+, PR-, HER2-) - Signed by Earlie Server, MD on 04/04/2018  Breast cancer with isolated bone metastasis, stage IV #Initially stage IIIA right breast cancer, ER positive, HER-2 negative status post bilateral oophorectomy, Right mastectomy and right axillary lymph node dissection, status post implant, and implant removal.  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 Estrogen positive, HER-2 positive breast cancer. Status post  radiation to T7  Labs reviewed and discussed with patient. Proceed with maintenance trastuzumab and Pertuzumab today.  PET scan images were reviewed and discussed with patient.  No detectable disease.  Stage IV- NED #Fulvestrant monthly  #Treatment induced diarrhea, symptoms are stable.  Continue Lomotil as needed. #Bone metastasis, status post radiation. Continue Zometa monthly. # Back pain, monitor symptoms.  I reviewed her oxycodone 5 mg daily 6 hours as needed dispense 60 tablets.  #Chemotherapy-induced neuropathy, continue Lyrica and nortriptyline.  #Depression.  Continue Lexapro 20 mg daily.   # Hypokalemia, recommend patient to take potassium chloride 20 mEq daily for a week followed by potassium chloride 10 mEq daily # Hypocalcemia, recommend patient to take calcium 1200 mg and vitamin D 800 to 1000 units.    Follow-up in 3 weeks for the next cycle of trastuzumab and Pertuzumab treatments.  Patient knows to call clinic if she  experiences concerning side effects  Earlie Server, MD, PhD Hematology Oncology [place] at [facility] 12/10/20

## 2020-12-10 NOTE — Progress Notes (Signed)
Pulse Rate: 103. MD, Dr. Tasia Catchings, aware. Per MD order: proceed with scheduled Kanjinti and Perjeta treatment today.

## 2020-12-10 NOTE — Progress Notes (Signed)
Patient has an  Increase in back pain since last MD visit.

## 2020-12-16 ENCOUNTER — Inpatient Hospital Stay: Payer: Medicare HMO

## 2020-12-16 ENCOUNTER — Ambulatory Visit: Payer: Medicare HMO | Admitting: Oncology

## 2020-12-16 ENCOUNTER — Inpatient Hospital Stay: Payer: Medicare HMO | Attending: Oncology

## 2020-12-16 VITALS — BP 102/69 | HR 102 | Temp 98.9°F | Resp 18

## 2020-12-16 DIAGNOSIS — C7951 Secondary malignant neoplasm of bone: Secondary | ICD-10-CM | POA: Insufficient documentation

## 2020-12-16 DIAGNOSIS — Z79899 Other long term (current) drug therapy: Secondary | ICD-10-CM | POA: Diagnosis not present

## 2020-12-16 DIAGNOSIS — C50411 Malignant neoplasm of upper-outer quadrant of right female breast: Secondary | ICD-10-CM | POA: Diagnosis present

## 2020-12-16 DIAGNOSIS — Z5111 Encounter for antineoplastic chemotherapy: Secondary | ICD-10-CM | POA: Diagnosis not present

## 2020-12-16 LAB — COMPREHENSIVE METABOLIC PANEL
ALT: 19 U/L (ref 0–44)
AST: 27 U/L (ref 15–41)
Albumin: 4.3 g/dL (ref 3.5–5.0)
Alkaline Phosphatase: 78 U/L (ref 38–126)
Anion gap: 10 (ref 5–15)
BUN: 14 mg/dL (ref 6–20)
CO2: 20 mmol/L — ABNORMAL LOW (ref 22–32)
Calcium: 8.5 mg/dL — ABNORMAL LOW (ref 8.9–10.3)
Chloride: 106 mmol/L (ref 98–111)
Creatinine, Ser: 0.8 mg/dL (ref 0.44–1.00)
GFR, Estimated: >60 mL/min (ref >60)
Glucose, Bld: 109 mg/dL — ABNORMAL HIGH (ref 70–99)
Potassium: 3.7 mmol/L (ref 3.5–5.1)
Sodium: 136 mmol/L (ref 135–145)
Total Bilirubin: 0.3 mg/dL (ref 0.3–1.2)
Total Protein: 7.4 g/dL (ref 6.5–8.1)

## 2020-12-16 LAB — CBC WITH DIFFERENTIAL/PLATELET
Abs Immature Granulocytes: 0.03 10*3/uL (ref 0.00–0.07)
Basophils Absolute: 0 10*3/uL (ref 0.0–0.1)
Basophils Relative: 1 %
Eosinophils Absolute: 0.1 10*3/uL (ref 0.0–0.5)
Eosinophils Relative: 2 %
HCT: 32.2 % — ABNORMAL LOW (ref 36.0–46.0)
Hemoglobin: 11.1 g/dL — ABNORMAL LOW (ref 12.0–15.0)
Immature Granulocytes: 0 %
Lymphocytes Relative: 25 %
Lymphs Abs: 2 10*3/uL (ref 0.7–4.0)
MCH: 31.1 pg (ref 26.0–34.0)
MCHC: 34.5 g/dL (ref 30.0–36.0)
MCV: 90.2 fL (ref 80.0–100.0)
Monocytes Absolute: 0.7 10*3/uL (ref 0.1–1.0)
Monocytes Relative: 8 %
Neutro Abs: 5.3 10*3/uL (ref 1.7–7.7)
Neutrophils Relative %: 64 %
Platelets: 289 10*3/uL (ref 150–400)
RBC: 3.57 MIL/uL — ABNORMAL LOW (ref 3.87–5.11)
RDW: 13.2 % (ref 11.5–15.5)
WBC: 8.2 10*3/uL (ref 4.0–10.5)
nRBC: 0 % (ref 0.0–0.2)

## 2020-12-16 MED ORDER — FULVESTRANT 250 MG/5ML IM SOLN
500.0000 mg | Freq: Once | INTRAMUSCULAR | Status: AC
  Administered 2020-12-16: 500 mg via INTRAMUSCULAR
  Filled 2020-12-16: qty 10

## 2020-12-16 MED ORDER — HEPARIN SOD (PORK) LOCK FLUSH 100 UNIT/ML IV SOLN
INTRAVENOUS | Status: AC
  Filled 2020-12-16: qty 5

## 2020-12-16 MED ORDER — ZOLEDRONIC ACID 4 MG/100ML IV SOLN
4.0000 mg | Freq: Once | INTRAVENOUS | Status: AC
  Administered 2020-12-16: 4 mg via INTRAVENOUS
  Filled 2020-12-16: qty 100

## 2020-12-16 MED ORDER — SODIUM CHLORIDE 0.9 % IV SOLN
INTRAVENOUS | Status: DC
  Filled 2020-12-16: qty 250

## 2020-12-16 NOTE — Progress Notes (Signed)
Pt here for tx in infusion. Calcium 8.5 .  Pt states taking calcium with vit d 600 mg two daily. MD aware. Ok to treat. Pt states unable to find 400 mg of calcium- which she states MD wanted her to add to her daily regimen.  Per MD ok to add another  600 mg of calcium with D . Pt aware.

## 2020-12-22 ENCOUNTER — Encounter: Payer: Self-pay | Admitting: Oncology

## 2020-12-24 ENCOUNTER — Other Ambulatory Visit: Payer: Self-pay | Admitting: Oncology

## 2020-12-30 ENCOUNTER — Other Ambulatory Visit: Payer: Self-pay

## 2020-12-30 DIAGNOSIS — C50411 Malignant neoplasm of upper-outer quadrant of right female breast: Secondary | ICD-10-CM

## 2020-12-30 DIAGNOSIS — Z170 Estrogen receptor positive status [ER+]: Secondary | ICD-10-CM

## 2020-12-31 ENCOUNTER — Inpatient Hospital Stay: Payer: Medicare HMO

## 2020-12-31 ENCOUNTER — Encounter: Payer: Self-pay | Admitting: Oncology

## 2020-12-31 ENCOUNTER — Inpatient Hospital Stay (HOSPITAL_BASED_OUTPATIENT_CLINIC_OR_DEPARTMENT_OTHER): Payer: Medicare HMO | Admitting: Oncology

## 2020-12-31 VITALS — BP 103/74 | HR 107 | Temp 98.7°F | Resp 18 | Wt 194.3 lb

## 2020-12-31 DIAGNOSIS — C50919 Malignant neoplasm of unspecified site of unspecified female breast: Secondary | ICD-10-CM | POA: Diagnosis not present

## 2020-12-31 DIAGNOSIS — C50411 Malignant neoplasm of upper-outer quadrant of right female breast: Secondary | ICD-10-CM

## 2020-12-31 DIAGNOSIS — Z170 Estrogen receptor positive status [ER+]: Secondary | ICD-10-CM

## 2020-12-31 DIAGNOSIS — Z5112 Encounter for antineoplastic immunotherapy: Secondary | ICD-10-CM

## 2020-12-31 DIAGNOSIS — C50811 Malignant neoplasm of overlapping sites of right female breast: Secondary | ICD-10-CM

## 2020-12-31 DIAGNOSIS — Z5111 Encounter for antineoplastic chemotherapy: Secondary | ICD-10-CM | POA: Diagnosis not present

## 2020-12-31 DIAGNOSIS — C7951 Secondary malignant neoplasm of bone: Secondary | ICD-10-CM | POA: Diagnosis not present

## 2020-12-31 DIAGNOSIS — T451X5A Adverse effect of antineoplastic and immunosuppressive drugs, initial encounter: Secondary | ICD-10-CM

## 2020-12-31 DIAGNOSIS — G620 Drug-induced polyneuropathy: Secondary | ICD-10-CM

## 2020-12-31 LAB — COMPREHENSIVE METABOLIC PANEL
ALT: 24 U/L (ref 0–44)
AST: 31 U/L (ref 15–41)
Albumin: 4.1 g/dL (ref 3.5–5.0)
Alkaline Phosphatase: 79 U/L (ref 38–126)
Anion gap: 8 (ref 5–15)
BUN: 15 mg/dL (ref 6–20)
CO2: 21 mmol/L — ABNORMAL LOW (ref 22–32)
Calcium: 9.2 mg/dL (ref 8.9–10.3)
Chloride: 107 mmol/L (ref 98–111)
Creatinine, Ser: 0.79 mg/dL (ref 0.44–1.00)
GFR, Estimated: >60 mL/min (ref >60)
Glucose, Bld: 127 mg/dL — ABNORMAL HIGH (ref 70–99)
Potassium: 3.5 mmol/L (ref 3.5–5.1)
Sodium: 136 mmol/L (ref 135–145)
Total Bilirubin: 0.4 mg/dL (ref 0.3–1.2)
Total Protein: 7.2 g/dL (ref 6.5–8.1)

## 2020-12-31 LAB — CBC WITH DIFFERENTIAL/PLATELET
Abs Immature Granulocytes: 0.02 10*3/uL (ref 0.00–0.07)
Basophils Absolute: 0 10*3/uL (ref 0.0–0.1)
Basophils Relative: 1 %
Eosinophils Absolute: 0.1 10*3/uL (ref 0.0–0.5)
Eosinophils Relative: 2 %
HCT: 33.3 % — ABNORMAL LOW (ref 36.0–46.0)
Hemoglobin: 11.3 g/dL — ABNORMAL LOW (ref 12.0–15.0)
Immature Granulocytes: 0 %
Lymphocytes Relative: 22 %
Lymphs Abs: 1.6 10*3/uL (ref 0.7–4.0)
MCH: 30.8 pg (ref 26.0–34.0)
MCHC: 33.9 g/dL (ref 30.0–36.0)
MCV: 90.7 fL (ref 80.0–100.0)
Monocytes Absolute: 0.6 10*3/uL (ref 0.1–1.0)
Monocytes Relative: 8 %
Neutro Abs: 4.9 10*3/uL (ref 1.7–7.7)
Neutrophils Relative %: 67 %
Platelets: 328 10*3/uL (ref 150–400)
RBC: 3.67 MIL/uL — ABNORMAL LOW (ref 3.87–5.11)
RDW: 12.8 % (ref 11.5–15.5)
WBC: 7.3 10*3/uL (ref 4.0–10.5)
nRBC: 0 % (ref 0.0–0.2)

## 2020-12-31 MED ORDER — HEPARIN SOD (PORK) LOCK FLUSH 100 UNIT/ML IV SOLN
500.0000 [IU] | Freq: Once | INTRAVENOUS | Status: AC | PRN
  Administered 2020-12-31: 500 [IU]
  Filled 2020-12-31: qty 5

## 2020-12-31 MED ORDER — SODIUM CHLORIDE 0.9 % IV SOLN
420.0000 mg | Freq: Once | INTRAVENOUS | Status: AC
  Administered 2020-12-31: 420 mg via INTRAVENOUS
  Filled 2020-12-31: qty 14

## 2020-12-31 MED ORDER — DIPHENHYDRAMINE HCL 25 MG PO CAPS
50.0000 mg | ORAL_CAPSULE | Freq: Once | ORAL | Status: AC
  Administered 2020-12-31: 50 mg via ORAL
  Filled 2020-12-31: qty 2

## 2020-12-31 MED ORDER — SODIUM CHLORIDE 0.9% FLUSH
10.0000 mL | Freq: Once | INTRAVENOUS | Status: AC
  Administered 2020-12-31: 10 mL via INTRAVENOUS
  Filled 2020-12-31: qty 10

## 2020-12-31 MED ORDER — TRASTUZUMAB-ANNS CHEMO 150 MG IV SOLR
6.0000 mg/kg | Freq: Once | INTRAVENOUS | Status: AC
  Administered 2020-12-31: 504 mg via INTRAVENOUS
  Filled 2020-12-31: qty 24

## 2020-12-31 MED ORDER — ACETAMINOPHEN 325 MG PO TABS
650.0000 mg | ORAL_TABLET | Freq: Once | ORAL | Status: AC
Start: 2020-12-31 — End: 2020-12-31
  Administered 2020-12-31: 650 mg via ORAL
  Filled 2020-12-31: qty 2

## 2020-12-31 MED ORDER — HEPARIN SOD (PORK) LOCK FLUSH 100 UNIT/ML IV SOLN
INTRAVENOUS | Status: AC
  Filled 2020-12-31: qty 5

## 2020-12-31 MED ORDER — SODIUM CHLORIDE 0.9 % IV SOLN
Freq: Once | INTRAVENOUS | Status: AC
  Filled 2020-12-31: qty 250

## 2020-12-31 NOTE — Progress Notes (Signed)
Hematology/Oncology Follow Up Note [place] [phone] [phone]  Patient Care Team: Marguerita Merles, MD as PCP - General (Family Medicine) End, Harrell Gave, MD as PCP - Cardiology (Cardiology) Rico Junker, RN as Oncology Nurse Navigator Earlie Server, MD as Consulting Physician (Oncology) Bary Castilla, Forest Gleason, MD (General Surgery) Noreene Filbert, MD as Referring Physician (Radiation Oncology) Noreene Filbert, MD as Radiation Oncologist (Radiation Oncology) Noreene Filbert, MD as Radiation Oncologist (Radiation Oncology)   Name of the patient: Theresa Chaney  [id number]  [birth year]   Date of visit: 12/31/20 REASON FOR VISIT Follow up for Assessment prior to chemotherapy treatment of breast cancer  Oncology History Cancer TREATMENT Neoadjuvant ddAC +one dose of Taxol, due to lack of response, surgery was offered. Case was discussed on breast tumor board. 03/19/2018 S/p right mastectomy and right axillary dissection, immediate breast reconstruction with placement of expanders.  ypT3 ypN2, + lymphovascular invasion,  Grade 3, margin is negative, close. ER 90%, PR 0%, HER2 IHC negative.  Also had elective bilateral salpingo-oophorectomy..  # 06/11/2018 s/p 11 cycles Taxol adjuvantly. Tolerated well.  # She has obtained dental clearance for starting Zometa.  S/p Zometa on 6/3/ 2019 # s/p adjuvant right chest wall radiation, finished 10/10/2018 #06/03/2019 underwent elective left prophylactic mastectomy and sentinel lymph node biopsy of left axilla.  # 06/03/2019 underwent elective left prophylactic mastectomy and sentinel lymph node biopsy of left axilla. Pathology negative for malignancy. Status post Mediport removal on 06/03/2019. She also underwent right implant removal on 06/03/2019. She also underwent right implant removal on 06/03/2019. #Negative genetic testing  #  chemotherapy-induced neuropathy, bilateral fingertips and lower extremities.   Patient is on Lyrica and nortriptyline.  Follows up with neurology. She follows up with lymphedema clinic.  #  07/07/2020, MRI thoracic spine without contrast showed lesions involving the T7 posterior elements most concerning for metastatic lesion.  No evidence of epidural tumor.  Minimal thoracic spondylosis without stenosis. MRI was reviewed by me and a PET scan was obtained for further evaluation. 07/20/2020, PET scan showed hypermetabolic metastasis involving the posterior element of T7, no additional evidence of metastasis in the neck, chest, abdomen or pelvis.  # 07/29/2020 T7 lesion biopsy showed metastatic carcinoma, compatible with breast origin.  Receptor status staining showed ER 71-80% positive, PR negative, HER-2 positive IHC 3+ # Patient finished spine radiation on 08/31/2020   INTERVAL HISTORY 40 yo female with above oncology history reviewed by me presents for follow-up of management of metastatic breast cancer.  Right shoulder pain, intermittently, worse at night.  No right upper extremity swelling, erythema.  Pain was worse shoulder motion.  She takes Tylenol, ibuprofen, sometimes her narcotic medications for pain. Occasionally nausea in the evening. Diarrhea 2 days ago, she attributes that to [employer]".     Review of Systems  Constitutional: Negative for chills, fever, malaise/fatigue and weight loss.  HENT: Negative for sore throat.   Eyes: Negative for redness.  Respiratory: Negative for cough, shortness of breath and wheezing.   Cardiovascular: Negative for chest pain, palpitations and leg swelling.  Gastrointestinal: Negative for abdominal pain, blood in stool, heartburn, nausea and vomiting.  Genitourinary: Negative for dysuria.  Musculoskeletal: Positive for joint pain. Negative for back pain and myalgias.  Skin: Negative for rash.  Neurological: Positive for tingling. Negative for dizziness and tremors.  Endo/Heme/Allergies: Does not bruise/bleed easily.   Psychiatric/Behavioral: Negative for hallucinations.    No Known Allergies  Patient Active Problem List   Diagnosis Date  Noted  . Encounter for monoclonal antibody treatment for malignancy 12/10/2020  . Bone lesion 11/19/2020  . Hypocalcemia 11/19/2020  . Inflammatory arthritis 11/19/2020  . Encounter for antineoplastic chemotherapy 10/29/2020  . Metastatic breast cancer ([place]) 08/31/2020  . Goals of care, counseling/discussion 08/11/2020  . Neuropathy due to chemotherapeutic drug ([place]) 08/11/2020  . HER2-positive carcinoma of breast ([place]) 08/11/2020  . 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 Bilateral hand swelling 10/03/2019  . Fracture of neck of metacarpal bone 05/14/2019  . Chronic fatigue 04/09/2019  . Polyarthralgia 04/09/2019  . Status post right breast reconstruction 02/26/2019  . Status post right mastectomy 02/26/2019  . Mastalgia 02/15/2019  . Shortness of breath 08/23/2018  . Nonischemic cardiomyopathy ([place]) 08/23/2018  . Preprocedural cardiovascular examination 08/23/2018  . Tachycardia 08/23/2018  . Palpitations 08/23/2018  . Estrogen receptor positive status (ER+) 04/04/2018  . Acquired absence of right breast and nipple 04/03/2018  . Breast cancer of upper-outer quadrant of right female breast ([place]) 03/19/2018  . Family history of cancer   . Malignant neoplasm of overlapping sites of right breast in female, estrogen receptor positive ([place]) 10/19/2017  . Gastroesophageal reflux disease without esophagitis 02/24/2017  . Generalized anxiety disorder 10/03/2014  . Headache 10/03/2014     Past Medical History:  Diagnosis Date  . Anemia   . Arthritis   . BRCA negative 11/26/2017  . Breast cancer ([place]) 10/11/2017   Multifocal, ER positive, PR negative, HER-2 negative. ypT3 ypN2a 8.7 cm, 4/15 nodes  . Cardiomyopathy ([place])    a. 10/2017 Echo: EF 60-65%, no rwma, Gr1 DD, nl RV size/fxn; b. 04/2018 Echo: EF 55-60%, no rwma, Nl RV size/fxn; c. 08/2018 Echo: EF  45%, diff HK, ? HK of antsept wall. Gr1 DD. Mild MR. Mild LAE/RAE. Mod dil RV.   [name] Chronic bronchitis ([place]) 11/2017  . COPD (chronic obstructive pulmonary disease) ([place])    MILD PER CXR  . Depression   . Family history of cancer   . GERD (gastroesophageal reflux disease)   . Headache    MIGRAINES  . Heart murmur    ASYMPTOMATIC  . Personal history of chemotherapy    current for right breast ca     Past Surgical History:  Procedure Laterality Date  . AXILLARY LYMPH NODE DISSECTION Right 03/19/2018   Procedure: AXILLARY LYMPH NODE DISSECTION;  Surgeon: Robert Bellow, MD;  Location: [facility];  Service: General;  Laterality: Right;  . BREAST BIOPSY Right 10/11/2017   12:30 posterior coil clip invasive mammary carcinoma  . BREAST BIOPSY Right 10/11/2017   11:30 middle depth ribbon clip DCIS  . BREAST BIOPSY Right 10/11/2017   5:30 anterior depth x shape invasive ductal carcinoma  . BREAST IMPLANT REMOVAL Right 06/03/2019   Procedure: REMOVAL OF RIGHT BREAST IMPLANTS;  Surgeon: Wallace Going, DO;  Location: [facility];  Service: Plastics;  Laterality: Right;  . BREAST RECONSTRUCTION WITH PLACEMENT OF TISSUE EXPANDER AND FLEX HD (ACELLULAR HYDRATED DERMIS) Right 03/19/2018   Procedure: BREAST RECONSTRUCTION WITH PLACEMENT OF TISSUE EXPANDER AND FLEX HD (ACELLULAR HYDRATED DERMIS);  Surgeon: Wallace Going, DO;  Location: [facility];  Service: Plastics;  Laterality: Right;  . CARPAL TUNNEL RELEASE Bilateral 2020  . CHOLECYSTECTOMY N/A 04/27/2020   Procedure: LAPAROSCOPIC CHOLECYSTECTOMY WITH INTRAOPERATIVE CHOLANGIOGRAM;  Surgeon: Robert Bellow, MD;  Location: [facility];  Service: General;  Laterality: N/A;  . ESOPHAGOGASTRODUODENOSCOPY (EGD) WITH PROPOFOL N/A 04/17/2020   Procedure: ESOPHAGOGASTRODUODENOSCOPY (EGD) WITH PROPOFOL;  Surgeon: Robert Bellow, MD;  Location:  [facility];  Service: Endoscopy;  Laterality: N/A;  with biopsy  . LAPAROSCOPIC BILATERAL SALPINGO  OOPHERECTOMY Bilateral 03/19/2018   Procedure: LAPAROSCOPIC BILATERAL SALPINGO OOPHORECTOMY;  Surgeon: Benjaman Kindler, MD;  Location: [facility];  Service: Gynecology;  Laterality: Bilateral;  . MASTECTOMY Right 03/2018  . MASTECTOMY W/ SENTINEL NODE BIOPSY Right 03/19/2018   Procedure: MASTECTOMY WITH SENTINEL LYMPH NODE BIOPSY;  Surgeon: Robert Bellow, MD;  Location: [facility];  Service: General;  Laterality: Right;  . PORT-A-CATH REMOVAL Left 06/03/2019   Procedure: REMOVAL PORT-A-CATH;  Surgeon: Robert Bellow, MD;  Location: [facility];  Service: General;  Laterality: Left;  . PORTACATH PLACEMENT Left 10/24/2017   Procedure: INSERTION PORT-A-CATH;  Surgeon: Robert Bellow, MD;  Location: [facility];  Service: General;  Laterality: Left;  . PORTACATH PLACEMENT Right 09/28/2020   Procedure: INSERTION PORT-A-CATH;  Surgeon: Robert Bellow, MD;  Location: [facility];  Service: General;  Laterality: Right;  . REMOVAL OF TISSUE EXPANDER AND PLACEMENT OF IMPLANT Right 07/20/2018   Procedure: REMOVAL OF RIGHT BREAST TISSUE EXPANDER AND PLACEMENT OF IMPLANT;  Surgeon: Wallace Going, DO;  Location: [place];  Service: Plastics;  Laterality: Right;  . SIMPLE MASTECTOMY WITH AXILLARY SENTINEL NODE BIOPSY Left 06/03/2019   Procedure: SIMPLE MASTECTOMY LEFT;  Surgeon: Robert Bellow, MD;  Location: [facility];  Service: General;  Laterality: Left;    Social History   Socioeconomic History  . Marital status: Married    Spouse name: Not on file  . Number of children: Not on file  . Years of education: Not on file  . Highest education level: Not on file  Occupational History  . Occupation: Occupational psychologist    Comment: Therapist, art   Tobacco Use  . Smoking status: Current Every Day Smoker    Packs/day: 0.50    Years: 18.00    Pack years: 9.00    Types: Cigarettes    Start date: 06/21/2018  . Smokeless tobacco: Never Used  Vaping Use  . Vaping  Use: Never used  Substance and Sexual Activity  . Alcohol use: No  . Drug use: No  . Sexual activity: Yes    Birth control/protection: Injection, Other-see comments    Comment: has had hysterectomy  Other Topics Concern  . Not on file  Social History Narrative   Lives at home with husband and daughter   Social Determinants of Health   Financial Resource Strain: Not on file  Food Insecurity: Not on file  Transportation Needs: Not on file  Physical Activity: Not on file  Stress: Not on file  Social Connections: Not on file  Intimate Partner Violence: Not on file     Family History  Problem Relation Age of Onset  . Melanoma Maternal Aunt        other aunts with BCC/SCC/Melanoma  . Diabetes Father   . Hypertension Father   . Hyperlipidemia Father   . Heart attack Father 66       "mild"  . Bladder Cancer Maternal Grandmother   . Cervical cancer Maternal Aunt 64       daughter w/ cervical cancer as well  . Melanoma Maternal Uncle        other uncles with BCC/SCC/Melanoma     Current Outpatient Medications:  .  acetaminophen (TYLENOL) 500 MG tablet, Take 500 mg by mouth every 6 (six) hours as needed., Disp: , Rfl:  .  albuterol (VENTOLIN HFA) 108 (90 Base) MCG/ACT  inhaler, Inhale 2 puffs into the lungs every 6 (six) hours as needed for wheezing or shortness of breath. , Disp: , Rfl:  .  Calcium Carbonate-Vitamin D3 (CALCIUM 600/VITAMIN D) 600-400 MG-UNIT TABS, Take 2 tablets by mouth daily., Disp: 180 tablet, Rfl: 0 .  cholecalciferol (VITAMIN D3) 25 MCG (1000 UNIT) tablet, Take 1 tablet (1,000 Units total) by mouth daily., Disp: 90 tablet, Rfl: 0 .  cyanocobalamin (,VITAMIN B-12,) 1000 MCG/ML injection, Inject 1,000 mcg into the skin every 30 (thirty) days., Disp: , Rfl:  .  cyclobenzaprine (FLEXERIL) 5 MG tablet, Take 5 mg by mouth 3 (three) times daily as needed for muscle spasms. , Disp: , Rfl:  .  diphenoxylate-atropine (LOMOTIL) 2.5-0.025 MG tablet, Take 1 tablet by  mouth 4 (four) times daily as needed for diarrhea or loose stools., Disp: 60 tablet, Rfl: 0 .  escitalopram (LEXAPRO) 20 MG tablet, Take 20 mg by mouth daily. , Disp: , Rfl:  .  esomeprazole (NEXIUM) 40 MG capsule, Take 40 mg by mouth daily before breakfast. , Disp: , Rfl:  .  Eszopiclone 3 MG TABS, Take 3 mg by mouth at bedtime as needed (sleep). , Disp: , Rfl:  .  folic acid (FOLVITE) 1 MG tablet, Take 1 mg by mouth daily., Disp: , Rfl:  .  furosemide (LASIX) 20 MG tablet, Take 1 tablet (20 mg total) by mouth daily as needed. (Patient not taking: No sig reported), Disp: 7 tablet, Rfl: 0 .  ibuprofen (ADVIL) 800 MG tablet, Take 800 mg by mouth every 8 (eight) hours as needed for moderate pain. , Disp: , Rfl:  .  lidocaine-prilocaine (EMLA) cream, Apply to port and cover 1-2 hours prior to appointment, Disp: 30 g, Rfl: 3 .  loperamide (IMODIUM) 2 MG capsule, Take 2 tablets with onset of diarrhea, then take 1 tablet every 2 hours until diarrhea stops. Maximum 8 tablets in 24hours, Disp: 30 capsule, Rfl: 3 .  loratadine (CLARITIN) 10 MG tablet, Take 10 mg by mouth daily. , Disp: , Rfl:  .  LORazepam (ATIVAN) 1 MG tablet, Take 1 mg by mouth 3 (three) times daily., Disp: , Rfl:  .  magic mouthwash SOLN, Take 5 mLs by mouth 4 (four) times daily. (Patient not taking: No sig reported), Disp: 480 mL, Rfl: 1 .  Magnesium 400 MG TABS, Take 400 mg by mouth 2 (two) times daily., Disp: , Rfl:  .  meloxicam (MOBIC) 7.5 MG tablet, Take 7.5 mg by mouth 2 (two) times daily as needed (pain/inflammation.). , Disp: , Rfl:  .  methotrexate (RHEUMATREX) 2.5 MG tablet, Take 20 mg by mouth every Sunday. , Disp: , Rfl:  .  metoprolol succinate (TOPROL-XL) 25 MG 24 hr tablet, Take 37.5 mg by mouth every morning. , Disp: , Rfl:  .  nortriptyline (PAMELOR) 10 MG capsule, Take 30 mg by mouth at bedtime. , Disp: , Rfl:  .  oxyCODONE (OXY IR/ROXICODONE) 5 MG immediate release tablet, Take 1 tablet (5 mg total) by mouth every 6  (six) hours as needed for moderate pain or severe pain., Disp: 60 tablet, Rfl: 0 .  phentermine (ADIPEX-P) 37.5 MG tablet, Take 37.5 mg by mouth daily before breakfast. , Disp: , Rfl:  .  potassium chloride (KLOR-CON) 10 MEQ tablet, Take 1 tablet (10 mEq total) by mouth as directed. Take 2 tablet (20 mEq) daily for 7 days, then 1 tablet (10 mEq) daily until finished., Disp: 45 tablet, Rfl: 0 .  pregabalin (LYRICA) 150  MG capsule, Take 150 mg by mouth 2 (two) times daily., Disp: , Rfl:  .  promethazine (PHENERGAN) 25 MG tablet, Take 1 tablet (25 mg total) by mouth every 8 (eight) hours as needed for nausea or vomiting., Disp: 90 tablet, Rfl: 0 .  pyridOXINE (VITAMIN B-6) 100 MG tablet, Take 100 mg by mouth daily., Disp: , Rfl:  .  sucralfate (CARAFATE) 1 g tablet, Take 1 tablet (1 g total) by mouth 3 (three) times daily. Dissolve in 3-4 tbsp warm water, swish and swallow., Disp: 90 tablet, Rfl: 1 .  topiramate (TOPAMAX) 50 MG tablet, Take 50 mg by mouth 2 (two) times daily. , Disp: , Rfl:  .  vitamin C (ASCORBIC ACID) 500 MG tablet, Take 500 mg by mouth daily., Disp: , Rfl:  No current facility-administered medications for this visit.  Facility-Administered Medications Ordered in Other Visits:  .  heparin lock flush 100 unit/mL, 500 Units, Intravenous, Once, Earlie Server, MD .  sodium chloride flush (NS) 0.9 % injection 10 mL, 10 mL, Intravenous, PRN, Earlie Server, MD, 10 mL at 11/19/18 1249 .  sodium chloride flush (NS) 0.9 % injection 10 mL, 10 mL, Intravenous, Once, Earlie Server, MD .  sodium chloride flush (NS) 0.9 % injection 10 mL, 10 mL, Intravenous, Once, Earlie Server, MD   Physical exam:  Vitals:   12/31/20 0837  BP: 103/74  Pulse: (!) 107  Resp: 18  Temp: 98.7 F (37.1 C)  Weight: 194 lb 4.8 oz (88.1 kg)  ECOG 1 Physical Exam Constitutional:      General: She is not in acute distress.    Appearance: She is not diaphoretic.  HENT:     Head: Normocephalic and atraumatic.     Nose: Nose  normal.     Mouth/Throat:     Pharynx: No oropharyngeal exudate.  Eyes:     General: No scleral icterus.       Left eye: No discharge.     Conjunctiva/sclera: Conjunctivae normal.     Pupils: Pupils are equal, round, and reactive to light.  Neck:     Vascular: No JVD.  Cardiovascular:     Rate and Rhythm: Normal rate and regular rhythm.     Heart sounds: Normal heart sounds. No murmur heard.   Pulmonary:     Effort: Pulmonary effort is normal. No respiratory distress.     Breath sounds: Normal breath sounds. No wheezing or rales.  Chest:     Chest wall: No tenderness.  Abdominal:     General: Bowel sounds are normal. There is no distension.     Palpations: Abdomen is soft. There is no mass.     Tenderness: There is no abdominal tenderness. There is no rebound.  Musculoskeletal:        General: Normal range of motion.     Cervical back: Normal range of motion and neck supple.  Lymphadenopathy:     Cervical: No cervical adenopathy.  Skin:    General: Skin is warm and dry.     Findings: No erythema or rash.  Neurological:     Mental Status: She is alert and oriented to person, place, and time.     Cranial Nerves: No cranial nerve deficit.     Motor: No abnormal muscle tone.     Coordination: Coordination normal.  Psychiatric:        Mood and Affect: Affect normal.           CMP Latest Ref Rng & Units  12/16/2020  Glucose 70 - 99 mg/dL 109(H)  BUN 6 - 20 mg/dL 14  Creatinine 0.44 - 1.00 mg/dL 0.80  Sodium 135 - 145 mmol/L 136  Potassium 3.5 - 5.1 mmol/L 3.7  Chloride 98 - 111 mmol/L 106  CO2 22 - 32 mmol/L 20(L)  Calcium 8.9 - 10.3 mg/dL 8.5(L)  Total Protein 6.5 - 8.1 g/dL 7.4  Total Bilirubin 0.3 - 1.2 mg/dL 0.3  Alkaline Phos 38 - 126 U/L 78  AST 15 - 41 U/L 27  ALT 0 - 44 U/L 19   CBC Latest Ref Rng & Units 12/16/2020  WBC 4.0 - 10.5 K/uL 8.2  Hemoglobin 12.0 - 15.0 g/dL 11.1(L)  Hematocrit 36.0 - 46.0 % 32.2(L)  Platelets 150 - 400 K/uL 289    RADIOGRAPHIC STUDIES: I have personally reviewed the radiological images as listed and agreed with the findings in the report. NM PET Image Restage (PS) Skull Base to Thigh  Result Date: 12/03/2020 CLINICAL DATA:  Subsequent treatment strategy for breast carcinoma. EXAM: NUCLEAR MEDICINE PET SKULL BASE TO THIGH TECHNIQUE: 10.9 mCi F-18 FDG was injected intravenously. Full-ring PET imaging was performed from the skull base to thigh after the radiotracer. CT data was obtained and used for attenuation correction and anatomic localization. Fasting blood glucose: 98 mg/dl COMPARISON:  PET-CT scan 07/20/2020 FINDINGS: Mediastinal blood pool activity: SUV max 2.54 Liver activity: SUV max NA NECK: No hypermetabolic lymph nodes in the neck. Incidental CT findings: none CHEST: No hypermetabolic mediastinal or hilar nodes. No suspicious pulmonary nodules on the CT scan. Incidental CT findings: none ABDOMEN/PELVIS: No abnormal hypermetabolic activity within the liver, pancreas, adrenal glands, or spleen. No hypermetabolic lymph nodes in the abdomen or pelvis. Incidental CT findings: none SKELETON: Resolution hypermetabolic activity at the T7 vertebral body. There is minimal perispinal musculature activity. No new skeletal lesions. Incidental CT findings: none IMPRESSION: 1. Resolution of metabolic activity in the T7 vertebral body metastasis. 2. Paraspinal musculature uptake at T7 related to treatment. 3. No evidence of new or progressive breast cancer metastasis. Electronically Signed   By: Suzy Bouchard M.D.   On: 12/03/2020 15:41        Assessment and plan- Patient is a 40 y.o. female presents for evaluation of newly diagnosed multicentric right breast cancer.  Cancer Staging Breast cancer of upper-outer quadrant of right female breast [facility]) Staging form: Breast, AJCC 8th Edition - Clinical: G2, ER+, PR+, HER2- - Signed by Earlie Server, MD on 04/05/2018 - Pathologic stage from 04/04/2018: No Stage  Recommended (ypT3, pN2, cM0, G3, ER+, PR-, HER2-) - Signed by Earlie Server, MD on 04/04/2018 - Pathologic: Stage IV (rpTX, pNX, cM1, ER+, PR-, HER2+) - Signed by Earlie Server, MD on 12/10/2020    1. Metastatic breast cancer ([place])   2. HER2-positive carcinoma of breast ([place])   3. Encounter for monoclonal antibody treatment for malignancy   4. Encounter for antineoplastic chemotherapy   5. Bone metastasis ([place])   Cancer Staging Breast cancer of upper-outer quadrant of right female breast ([place]) Staging form: Breast, AJCC 8th Edition - Clinical: G2, ER+, PR+, HER2- - Signed by Earlie Server, MD on 04/05/2018 - Pathologic stage from 04/04/2018: No Stage Recommended (ypT3, pN2, cM0, G3, ER+, PR-, HER2-) - Signed by Earlie Server, MD on 04/04/2018 - Pathologic: Stage IV (rpTX, pNX, cM1, ER+, PR-, HER2+) - Signed by Earlie Server, MD on 12/10/2020  Breast cancer with isolated bone metastasis, stage IV #Initially stage IIIA right breast cancer, ER positive, HER-2 negative  status post bilateral oophorectomy, Right mastectomy and right axillary lymph node dissection, status post implant, and implant removal.  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 Estrogen positive, HER-2 positive breast cancer. Status post  radiation to T7  Labs reviewed and discussed with patient Proceed with trastuzumab and epratuzumab maintenance today. Fulvestrant monthly.  #Tachycardia, chronic intermittent issue for her. Will obtain 2D echo  #Treatment induced diarrhea, symptoms are stable.  Continue Lomotil as needed. #Bone metastasis, status post radiation. Continue Zometa monthly.  # Back pain, monitor symptoms.  Continue oxycodone 5 mg daily 6 hours as needed #Right shoulder pain, continue supportive care, pain control.  Recommend patient to discuss with primary care provider if pain is not improving.  Suspect capsulitis  #Chemotherapy-induced neuropathy,  continue Lyrica and nortriptyline.  #Depression.  Continue Lexapro 20 mg daily.   # Hypokalemia, normal potassium today. # Hypocalcemia, calcium has improved.  Recommend patient to take calcium and vitamin D supplementation.   Follow-up in 3 weeks for the next cycle of trastuzumab and Pertuzumab treatments.  Patient knows to call clinic if she experiences concerning side effects  Earlie Server, MD, PhD Hematology Oncology [place] at [facility] 12/31/20

## 2020-12-31 NOTE — Progress Notes (Signed)
Patient has bone main mainly in her shoulders that is worse at night.

## 2020-12-31 NOTE — Progress Notes (Signed)
HR 107. Per Dr Tasia Catchings okay to proceed with treatment today  Stable at discharge

## 2021-01-11 ENCOUNTER — Telehealth: Payer: Self-pay | Admitting: Oncology

## 2021-01-11 NOTE — Telephone Encounter (Signed)
Pt called to report she tested pos for COVID today (01/11/21) and needs to r/s her appts.

## 2021-01-12 ENCOUNTER — Encounter: Payer: Self-pay | Admitting: Oncology

## 2021-01-12 NOTE — Telephone Encounter (Signed)
Done

## 2021-01-12 NOTE — Telephone Encounter (Signed)
Done   01/13/21 appt has ben cx

## 2021-01-13 ENCOUNTER — Inpatient Hospital Stay: Payer: Medicare HMO

## 2021-01-13 ENCOUNTER — Ambulatory Visit: Payer: Medicare HMO | Admitting: Oncology

## 2021-01-20 ENCOUNTER — Other Ambulatory Visit: Payer: Self-pay | Admitting: Oncology

## 2021-01-20 NOTE — Telephone Encounter (Signed)
Please check if she is using lomotil or imodium. thanks

## 2021-01-20 NOTE — Telephone Encounter (Signed)
Patient states she is using Lomotil so I told her that we will not refill the Imodium

## 2021-01-21 ENCOUNTER — Inpatient Hospital Stay (HOSPITAL_BASED_OUTPATIENT_CLINIC_OR_DEPARTMENT_OTHER): Payer: Medicare HMO | Admitting: Oncology

## 2021-01-21 ENCOUNTER — Inpatient Hospital Stay: Payer: Medicare HMO

## 2021-01-21 ENCOUNTER — Other Ambulatory Visit: Payer: Self-pay

## 2021-01-21 ENCOUNTER — Inpatient Hospital Stay: Payer: Medicare HMO | Attending: Oncology

## 2021-01-21 ENCOUNTER — Encounter: Payer: Self-pay | Admitting: Oncology

## 2021-01-21 VITALS — BP 112/70 | HR 101 | Temp 96.6°F | Resp 18 | Wt 193.3 lb

## 2021-01-21 VITALS — HR 96

## 2021-01-21 DIAGNOSIS — G620 Drug-induced polyneuropathy: Secondary | ICD-10-CM

## 2021-01-21 DIAGNOSIS — C50411 Malignant neoplasm of upper-outer quadrant of right female breast: Secondary | ICD-10-CM | POA: Insufficient documentation

## 2021-01-21 DIAGNOSIS — Z1731 Human epidermal growth factor receptor 2 positive status: Secondary | ICD-10-CM

## 2021-01-21 DIAGNOSIS — C50919 Malignant neoplasm of unspecified site of unspecified female breast: Secondary | ICD-10-CM | POA: Diagnosis not present

## 2021-01-21 DIAGNOSIS — C7951 Secondary malignant neoplasm of bone: Secondary | ICD-10-CM | POA: Diagnosis not present

## 2021-01-21 DIAGNOSIS — T451X5A Adverse effect of antineoplastic and immunosuppressive drugs, initial encounter: Secondary | ICD-10-CM

## 2021-01-21 DIAGNOSIS — Z79899 Other long term (current) drug therapy: Secondary | ICD-10-CM | POA: Insufficient documentation

## 2021-01-21 DIAGNOSIS — Z5111 Encounter for antineoplastic chemotherapy: Secondary | ICD-10-CM | POA: Diagnosis not present

## 2021-01-21 DIAGNOSIS — Z5112 Encounter for antineoplastic immunotherapy: Secondary | ICD-10-CM

## 2021-01-21 DIAGNOSIS — Z170 Estrogen receptor positive status [ER+]: Secondary | ICD-10-CM

## 2021-01-21 DIAGNOSIS — C50811 Malignant neoplasm of overlapping sites of right female breast: Secondary | ICD-10-CM

## 2021-01-21 DIAGNOSIS — G893 Neoplasm related pain (acute) (chronic): Secondary | ICD-10-CM

## 2021-01-21 LAB — CBC WITH DIFFERENTIAL/PLATELET
Abs Immature Granulocytes: 0.03 10*3/uL (ref 0.00–0.07)
Basophils Absolute: 0.1 10*3/uL (ref 0.0–0.1)
Basophils Relative: 1 %
Eosinophils Absolute: 0.2 10*3/uL (ref 0.0–0.5)
Eosinophils Relative: 2 %
HCT: 32.5 % — ABNORMAL LOW (ref 36.0–46.0)
Hemoglobin: 11.2 g/dL — ABNORMAL LOW (ref 12.0–15.0)
Immature Granulocytes: 0 %
Lymphocytes Relative: 27 %
Lymphs Abs: 1.9 10*3/uL (ref 0.7–4.0)
MCH: 30.5 pg (ref 26.0–34.0)
MCHC: 34.5 g/dL (ref 30.0–36.0)
MCV: 88.6 fL (ref 80.0–100.0)
Monocytes Absolute: 0.7 10*3/uL (ref 0.1–1.0)
Monocytes Relative: 10 %
Neutro Abs: 4.3 10*3/uL (ref 1.7–7.7)
Neutrophils Relative %: 60 %
Platelets: 284 10*3/uL (ref 150–400)
RBC: 3.67 MIL/uL — ABNORMAL LOW (ref 3.87–5.11)
RDW: 12.9 % (ref 11.5–15.5)
WBC: 7.2 10*3/uL (ref 4.0–10.5)
nRBC: 0 % (ref 0.0–0.2)

## 2021-01-21 LAB — COMPREHENSIVE METABOLIC PANEL
ALT: 33 U/L (ref 0–44)
AST: 33 U/L (ref 15–41)
Albumin: 4.1 g/dL (ref 3.5–5.0)
Alkaline Phosphatase: 84 U/L (ref 38–126)
Anion gap: 11 (ref 5–15)
BUN: 15 mg/dL (ref 6–20)
CO2: 21 mmol/L — ABNORMAL LOW (ref 22–32)
Calcium: 9 mg/dL (ref 8.9–10.3)
Chloride: 108 mmol/L (ref 98–111)
Creatinine, Ser: 0.8 mg/dL (ref 0.44–1.00)
GFR, Estimated: >60 mL/min (ref >60)
Glucose, Bld: 143 mg/dL — ABNORMAL HIGH (ref 70–99)
Potassium: 3.4 mmol/L — ABNORMAL LOW (ref 3.5–5.1)
Sodium: 140 mmol/L (ref 135–145)
Total Bilirubin: 0.5 mg/dL (ref 0.3–1.2)
Total Protein: 7.3 g/dL (ref 6.5–8.1)

## 2021-01-21 LAB — MAGNESIUM: Magnesium: 1.9 mg/dL (ref 1.7–2.4)

## 2021-01-21 MED ORDER — SODIUM CHLORIDE 0.9 % IV SOLN
6.0000 mg/kg | Freq: Once | INTRAVENOUS | Status: AC
  Administered 2021-01-21: 504 mg via INTRAVENOUS
  Filled 2021-01-21: qty 24

## 2021-01-21 MED ORDER — SODIUM CHLORIDE 0.9 % IV SOLN
Freq: Once | INTRAVENOUS | Status: AC
  Filled 2021-01-21: qty 250

## 2021-01-21 MED ORDER — SODIUM CHLORIDE 0.9% FLUSH
10.0000 mL | INTRAVENOUS | Status: DC | PRN
  Administered 2021-01-21: 10 mL via INTRAVENOUS
  Filled 2021-01-21: qty 10

## 2021-01-21 MED ORDER — DIPHENHYDRAMINE HCL 25 MG PO CAPS
50.0000 mg | ORAL_CAPSULE | Freq: Once | ORAL | Status: AC
  Administered 2021-01-21: 50 mg via ORAL
  Filled 2021-01-21: qty 2

## 2021-01-21 MED ORDER — HEPARIN SOD (PORK) LOCK FLUSH 100 UNIT/ML IV SOLN
500.0000 [IU] | Freq: Once | INTRAVENOUS | Status: AC
  Administered 2021-01-21: 500 [IU] via INTRAVENOUS
  Filled 2021-01-21: qty 5

## 2021-01-21 MED ORDER — HEPARIN SOD (PORK) LOCK FLUSH 100 UNIT/ML IV SOLN
INTRAVENOUS | Status: AC
  Filled 2021-01-21: qty 5

## 2021-01-21 MED ORDER — PERTUZUMAB CHEMO INJECTION 420 MG/14ML
420.0000 mg | Freq: Once | INTRAVENOUS | Status: AC
  Administered 2021-01-21: 420 mg via INTRAVENOUS
  Filled 2021-01-21: qty 14

## 2021-01-21 MED ORDER — FULVESTRANT 250 MG/5ML IM SOLN
500.0000 mg | Freq: Once | INTRAMUSCULAR | Status: AC
  Administered 2021-01-21: 500 mg via INTRAMUSCULAR
  Filled 2021-01-21: qty 10

## 2021-01-21 MED ORDER — ZOLEDRONIC ACID 4 MG/100ML IV SOLN
4.0000 mg | Freq: Once | INTRAVENOUS | Status: AC
  Administered 2021-01-21: 4 mg via INTRAVENOUS
  Filled 2021-01-21: qty 100

## 2021-01-21 MED ORDER — ACETAMINOPHEN 325 MG PO TABS
650.0000 mg | ORAL_TABLET | Freq: Once | ORAL | Status: AC
  Administered 2021-01-21: 650 mg via ORAL
  Filled 2021-01-21: qty 2

## 2021-01-21 NOTE — Progress Notes (Signed)
Patient tolerated treatment well. Patient stable and discharged to home. 

## 2021-01-21 NOTE — Progress Notes (Signed)
Patient here for follow up. Pt was COVID positive on 1/31 and now has residual sinus drainage. Would like to talk about possibly getting and antibiotic.

## 2021-01-21 NOTE — Progress Notes (Signed)
Hematology/Oncology Follow Up Note [place] [phone] [phone]  Patient Care Team: Marguerita Merles, MD as PCP - General (Family Medicine) End, Harrell Gave, MD as PCP - Cardiology (Cardiology) Rico Junker, RN as Oncology Nurse Navigator Earlie Server, MD as Consulting Physician (Oncology) Bary Castilla, Forest Gleason, MD (General Surgery) Noreene Filbert, MD as Referring Physician (Radiation Oncology) Noreene Filbert, MD as Radiation Oncologist (Radiation Oncology) Noreene Filbert, MD as Radiation Oncologist (Radiation Oncology)   Name of the patient: Theresa Chaney  [id number]  December 11, 1981   Date of visit: 01/21/21 REASON FOR VISIT Follow up for Assessment prior to chemotherapy treatment of breast cancer  Oncology History Cancer TREATMENT Neoadjuvant ddAC +one dose of Taxol, due to lack of response, surgery was offered. Case was discussed on breast tumor board. 03/19/2018 S/p right mastectomy and right axillary dissection, immediate breast reconstruction with placement of expanders.  ypT3 ypN2, + lymphovascular invasion,  Grade 3, margin is negative, close. ER 90%, PR 0%, HER2 IHC negative.  Also had elective bilateral salpingo-oophorectomy..  # 06/11/2018 s/p 11 cycles Taxol adjuvantly. Tolerated well.  # She has obtained dental clearance for starting Zometa.  S/p Zometa on 6/3/ 2019 # s/p adjuvant right chest wall radiation, finished 10/10/2018 #06/03/2019 underwent elective left prophylactic mastectomy and sentinel lymph node biopsy of left axilla.  # 06/03/2019 underwent elective left prophylactic mastectomy and sentinel lymph node biopsy of left axilla. Pathology negative for malignancy. Status post Mediport removal on 06/03/2019. She also underwent right implant removal on 06/03/2019. She also underwent right implant removal on 06/03/2019. #Negative genetic testing  #  chemotherapy-induced neuropathy, bilateral fingertips and lower extremities.   Patient is on Lyrica and nortriptyline.  Follows up with neurology. She follows up with lymphedema clinic.  #  07/07/2020, MRI thoracic spine without contrast showed lesions involving the T7 posterior elements most concerning for metastatic lesion.  No evidence of epidural tumor.  Minimal thoracic spondylosis without stenosis. MRI was reviewed by me and a PET scan was obtained for further evaluation. 07/20/2020, PET scan showed hypermetabolic metastasis involving the posterior element of T7, no additional evidence of metastasis in the neck, chest, abdomen or pelvis.  # 07/29/2020 T7 lesion biopsy showed metastatic carcinoma, compatible with breast origin.  Receptor status staining showed ER 71-80% positive, PR negative, HER-2 positive IHC 3+ # Patient finished spine radiation on 08/31/2020   INTERVAL HISTORY 40 yo female with above oncology history reviewed by me presents for follow-up of management of metastatic breast cancer.  Patient is positive for COVID-19 on 01/11/2021 and rescheduled her fulvestrant and Zometa appointment. Symptoms are better.  She continues to have some sinus drainage and ear pressure..  Some cough.  No fever, chills.    Review of Systems  Constitutional: Negative for chills, fever, malaise/fatigue and weight loss.  HENT: Negative for sore throat.   Eyes: Negative for redness.  Respiratory: Negative for cough, shortness of breath and wheezing.   Cardiovascular: Negative for chest pain, palpitations and leg swelling.  Gastrointestinal: Negative for abdominal pain, blood in stool, heartburn, nausea and vomiting.  Genitourinary: Negative for dysuria.  Musculoskeletal: Positive for joint pain. Negative for back pain and myalgias.  Skin: Negative for rash.  Neurological: Positive for tingling. Negative for dizziness and tremors.  Endo/Heme/Allergies: Does not bruise/bleed easily.  Psychiatric/Behavioral: Negative for hallucinations.   Sinus drainage and ear pressure  No  Known Allergies  Patient Active Problem List   Diagnosis Date Noted  . Encounter for monoclonal antibody treatment  for malignancy 12/10/2020  . Bone lesion 11/19/2020  . Hypocalcemia 11/19/2020  . Inflammatory arthritis 11/19/2020  . Encounter for antineoplastic chemotherapy 10/29/2020  . Metastatic breast cancer ([place]) 08/31/2020  . Goals of care, counseling/discussion 08/11/2020  . Neuropathy due to chemotherapeutic drug ([place]) 08/11/2020  . HER2-positive carcinoma of breast ([place]) 08/11/2020  . Rheumatoid arteritis ([place]) 10/13/2019  . Bilateral hand swelling 10/03/2019  . Fracture of neck of metacarpal bone 05/14/2019  . Chronic fatigue 04/09/2019  . Polyarthralgia 04/09/2019  . Status post right breast reconstruction 02/26/2019  . Status post right mastectomy 02/26/2019  . Mastalgia 02/15/2019  . Shortness of breath 08/23/2018  . Nonischemic cardiomyopathy ([place]) 08/23/2018  . Preprocedural cardiovascular examination 08/23/2018  . Tachycardia 08/23/2018  . Palpitations 08/23/2018  . Estrogen receptor positive status (ER+) 04/04/2018  . Acquired absence of right breast and nipple 04/03/2018  . Breast cancer of upper-outer quadrant of right female breast ([place]) 03/19/2018  . Family history of cancer   . Malignant neoplasm of overlapping sites of right breast in female, estrogen receptor positive ([place]) 10/19/2017  . Gastroesophageal reflux disease without esophagitis 02/24/2017  . Generalized anxiety disorder 10/03/2014  . Headache 10/03/2014     Past Medical History:  Diagnosis Date  . Anemia   . Arthritis   . BRCA negative 11/26/2017  . Breast cancer ([place]) 10/11/2017   Multifocal, ER positive, PR negative, HER-2 negative. ypT3 ypN2a 8.7 cm, 4/15 nodes  . Cardiomyopathy ([place])    a. 10/2017 Echo: EF 60-65%, no rwma, Gr1 DD, nl RV size/fxn; b. 04/2018 Echo: EF 55-60%, no rwma, Nl RV size/fxn; c. 08/2018 Echo: EF 45%, diff HK, ? HK of antsept wall. Gr1 DD. Mild MR. Mild LAE/RAE.  Mod dil RV.   [name] Chronic bronchitis ([place]) 11/2017  . COPD (chronic obstructive pulmonary disease) ([place])    MILD PER CXR  . Depression   . Family history of cancer   . GERD (gastroesophageal reflux disease)   . Headache    MIGRAINES  . Heart murmur    ASYMPTOMATIC  . Personal history of chemotherapy    current for right breast ca     Past Surgical History:  Procedure Laterality Date  . AXILLARY LYMPH NODE DISSECTION Right 03/19/2018   Procedure: AXILLARY LYMPH NODE DISSECTION;  Surgeon: Robert Bellow, MD;  Location: [facility];  Service: General;  Laterality: Right;  . BREAST BIOPSY Right 10/11/2017   12:30 posterior coil clip invasive mammary carcinoma  . BREAST BIOPSY Right 10/11/2017   11:30 middle depth ribbon clip DCIS  . BREAST BIOPSY Right 10/11/2017   5:30 anterior depth x shape invasive ductal carcinoma  . BREAST IMPLANT REMOVAL Right 06/03/2019   Procedure: REMOVAL OF RIGHT BREAST IMPLANTS;  Surgeon: Wallace Going, DO;  Location: [facility];  Service: Plastics;  Laterality: Right;  . BREAST RECONSTRUCTION WITH PLACEMENT OF TISSUE EXPANDER AND FLEX HD (ACELLULAR HYDRATED DERMIS) Right 03/19/2018   Procedure: BREAST RECONSTRUCTION WITH PLACEMENT OF TISSUE EXPANDER AND FLEX HD (ACELLULAR HYDRATED DERMIS);  Surgeon: Wallace Going, DO;  Location: [facility];  Service: Plastics;  Laterality: Right;  . CARPAL TUNNEL RELEASE Bilateral 2020  . CHOLECYSTECTOMY N/A 04/27/2020   Procedure: LAPAROSCOPIC CHOLECYSTECTOMY WITH INTRAOPERATIVE CHOLANGIOGRAM;  Surgeon: Robert Bellow, MD;  Location: [facility];  Service: General;  Laterality: N/A;  . ESOPHAGOGASTRODUODENOSCOPY (EGD) WITH PROPOFOL N/A 04/17/2020   Procedure: ESOPHAGOGASTRODUODENOSCOPY (EGD) WITH PROPOFOL;  Surgeon: Robert Bellow, MD;  Location: [facility];  Service: Endoscopy;  Laterality: N/A;  with biopsy  . LAPAROSCOPIC BILATERAL SALPINGO OOPHERECTOMY Bilateral 03/19/2018   Procedure: LAPAROSCOPIC BILATERAL  SALPINGO OOPHORECTOMY;  Surgeon: Benjaman Kindler, MD;  Location: [facility];  Service: Gynecology;  Laterality: Bilateral;  . MASTECTOMY Right 03/2018  . MASTECTOMY W/ SENTINEL NODE BIOPSY Right 03/19/2018   Procedure: MASTECTOMY WITH SENTINEL LYMPH NODE BIOPSY;  Surgeon: Robert Bellow, MD;  Location: [facility];  Service: General;  Laterality: Right;  . PORT-A-CATH REMOVAL Left 06/03/2019   Procedure: REMOVAL PORT-A-CATH;  Surgeon: Robert Bellow, MD;  Location: [facility];  Service: General;  Laterality: Left;  . PORTACATH PLACEMENT Left 10/24/2017   Procedure: INSERTION PORT-A-CATH;  Surgeon: Robert Bellow, MD;  Location: [facility];  Service: General;  Laterality: Left;  . PORTACATH PLACEMENT Right 09/28/2020   Procedure: INSERTION PORT-A-CATH;  Surgeon: Robert Bellow, MD;  Location: [facility];  Service: General;  Laterality: Right;  . REMOVAL OF TISSUE EXPANDER AND PLACEMENT OF IMPLANT Right 07/20/2018   Procedure: REMOVAL OF RIGHT BREAST TISSUE EXPANDER AND PLACEMENT OF IMPLANT;  Surgeon: Wallace Going, DO;  Location: [facility];  Service: Plastics;  Laterality: Right;  . SIMPLE MASTECTOMY WITH AXILLARY SENTINEL NODE BIOPSY Left 06/03/2019   Procedure: SIMPLE MASTECTOMY LEFT;  Surgeon: Robert Bellow, MD;  Location: [facility];  Service: General;  Laterality: Left;    Social History   Socioeconomic History  . Marital status: Married    Spouse name: Not on file  . Number of children: Not on file  . Years of education: Not on file  . Highest education level: Not on file  Occupational History  . Occupation: Occupational psychologist    Comment: Therapist, art   Tobacco Use  . Smoking status: Current Every Day Smoker    Packs/day: 0.50    Years: 18.00    Pack years: 9.00    Types: Cigarettes    Start date: 06/21/2018  . Smokeless tobacco: Never Used  Vaping Use  . Vaping Use: Never used  Substance and Sexual Activity  . Alcohol use: No   . Drug use: No  . Sexual activity: Yes    Birth control/protection: Injection, Other-see comments    Comment: has had hysterectomy  Other Topics Concern  . Not on file  Social History Narrative   Lives at home with husband and daughter   Social Determinants of Health   Financial Resource Strain: Not on file  Food Insecurity: Not on file  Transportation Needs: Not on file  Physical Activity: Not on file  Stress: Not on file  Social Connections: Not on file  Intimate Partner Violence: Not on file     Family History  Problem Relation Age of Onset  . Melanoma Maternal Aunt        other aunts with BCC/SCC/Melanoma  . Diabetes Father   . Hypertension Father   . Hyperlipidemia Father   . Heart attack Father 8       "mild"  . Bladder Cancer Maternal Grandmother   . Cervical cancer Maternal Aunt 64       daughter w/ cervical cancer as well  . Melanoma Maternal Uncle        other uncles with BCC/SCC/Melanoma     Current Outpatient Medications:  .  acetaminophen (TYLENOL) 500 MG tablet, Take 500 mg by mouth every 6 (six) hours as needed., Disp: , Rfl:  .  albuterol (VENTOLIN HFA) 108 (90 Base) MCG/ACT inhaler, Inhale 2 puffs into the lungs every 6 (  six) hours as needed for wheezing or shortness of breath. , Disp: , Rfl:  .  Calcium Carbonate-Vitamin D3 (CALCIUM 600/VITAMIN D) 600-400 MG-UNIT TABS, Take 2 tablets by mouth daily. (Patient taking differently: Take 3 tablets by mouth daily.), Disp: 180 tablet, Rfl: 0 .  cholecalciferol (VITAMIN D3) 25 MCG (1000 UNIT) tablet, Take 1 tablet (1,000 Units total) by mouth daily., Disp: 90 tablet, Rfl: 0 .  cyanocobalamin (,VITAMIN B-12,) 1000 MCG/ML injection, Inject 1,000 mcg into the skin every 30 (thirty) days., Disp: , Rfl:  .  cyclobenzaprine (FLEXERIL) 5 MG tablet, Take 5 mg by mouth 3 (three) times daily as needed for muscle spasms. , Disp: , Rfl:  .  diphenoxylate-atropine (LOMOTIL) 2.5-0.025 MG tablet, Take 1 tablet by mouth 4  (four) times daily as needed for diarrhea or loose stools., Disp: 60 tablet, Rfl: 0 .  escitalopram (LEXAPRO) 20 MG tablet, Take 20 mg by mouth daily. , Disp: , Rfl:  .  esomeprazole (NEXIUM) 40 MG capsule, Take 40 mg by mouth daily before breakfast. , Disp: , Rfl:  .  Eszopiclone 3 MG TABS, Take 3 mg by mouth at bedtime as needed (sleep). , Disp: , Rfl:  .  folic acid (FOLVITE) 1 MG tablet, Take 1 mg by mouth daily., Disp: , Rfl:  .  furosemide (LASIX) 20 MG tablet, Take 1 tablet (20 mg total) by mouth daily as needed. (Patient not taking: No sig reported), Disp: 7 tablet, Rfl: 0 .  ibuprofen (ADVIL) 800 MG tablet, Take 800 mg by mouth every 8 (eight) hours as needed for moderate pain. , Disp: , Rfl:  .  lidocaine-prilocaine (EMLA) cream, Apply to port and cover 1-2 hours prior to appointment, Disp: 30 g, Rfl: 3 .  loperamide (IMODIUM) 2 MG capsule, Take 2 tablets with onset of diarrhea, then take 1 tablet every 2 hours until diarrhea stops. Maximum 8 tablets in 24hours, Disp: 30 capsule, Rfl: 3 .  loratadine (CLARITIN) 10 MG tablet, Take 10 mg by mouth daily. , Disp: , Rfl:  .  LORazepam (ATIVAN) 1 MG tablet, Take 1 mg by mouth 3 (three) times daily., Disp: , Rfl:  .  magic mouthwash SOLN, Take 5 mLs by mouth 4 (four) times daily. (Patient taking differently: Take 5 mLs by mouth 4 (four) times daily as needed.), Disp: 480 mL, Rfl: 1 .  Magnesium 400 MG TABS, Take 400 mg by mouth 2 (two) times daily., Disp: , Rfl:  .  meloxicam (MOBIC) 7.5 MG tablet, Take 7.5 mg by mouth 2 (two) times daily as needed (pain/inflammation.). , Disp: , Rfl:  .  methotrexate (RHEUMATREX) 2.5 MG tablet, Take 20 mg by mouth every Sunday. , Disp: , Rfl:  .  metoprolol succinate (TOPROL-XL) 25 MG 24 hr tablet, Take 37.5 mg by mouth every morning. , Disp: , Rfl:  .  nortriptyline (PAMELOR) 10 MG capsule, Take 30 mg by mouth at bedtime. , Disp: , Rfl:  .  oxyCODONE (OXY IR/ROXICODONE) 5 MG immediate release tablet, Take 1  tablet (5 mg total) by mouth every 6 (six) hours as needed for moderate pain or severe pain., Disp: 60 tablet, Rfl: 0 .  phentermine (ADIPEX-P) 37.5 MG tablet, Take 37.5 mg by mouth daily before breakfast. , Disp: , Rfl:  .  potassium chloride (KLOR-CON) 10 MEQ tablet, Take 1 tablet (10 mEq total) by mouth as directed. Take 2 tablet (20 mEq) daily for 7 days, then 1 tablet (10 mEq) daily until finished., Disp: 45  tablet, Rfl: 0 .  pregabalin (LYRICA) 150 MG capsule, Take 150 mg by mouth 2 (two) times daily., Disp: , Rfl:  .  promethazine (PHENERGAN) 25 MG tablet, Take 1 tablet (25 mg total) by mouth every 8 (eight) hours as needed for nausea or vomiting., Disp: 90 tablet, Rfl: 0 .  pyridOXINE (VITAMIN B-6) 100 MG tablet, Take 100 mg by mouth daily., Disp: , Rfl:  .  sucralfate (CARAFATE) 1 g tablet, Take 1 tablet (1 g total) by mouth 3 (three) times daily. Dissolve in 3-4 tbsp warm water, swish and swallow. (Patient taking differently: Take 1 g by mouth 3 (three) times daily as needed. Dissolve in 3-4 tbsp warm water, swish and swallow.), Disp: 90 tablet, Rfl: 1 .  topiramate (TOPAMAX) 50 MG tablet, Take 50 mg by mouth 2 (two) times daily. , Disp: , Rfl:  .  vitamin C (ASCORBIC ACID) 500 MG tablet, Take 500 mg by mouth daily., Disp: , Rfl:  No current facility-administered medications for this visit.  Facility-Administered Medications Ordered in Other Visits:  .  heparin lock flush 100 unit/mL, 500 Units, Intravenous, Once, Earlie Server, MD .  heparin lock flush 100 unit/mL, 500 Units, Intravenous, Once, Earlie Server, MD .  sodium chloride flush (NS) 0.9 % injection 10 mL, 10 mL, Intravenous, PRN, Earlie Server, MD, 10 mL at 11/19/18 1249 .  sodium chloride flush (NS) 0.9 % injection 10 mL, 10 mL, Intravenous, Once, Earlie Server, MD .  sodium chloride flush (NS) 0.9 % injection 10 mL, 10 mL, Intravenous, PRN, Earlie Server, MD   Physical exam:  Vitals:   01/21/21 0844  BP: 112/70  Pulse: (!) 101  Resp: 18  Temp:  (!) 96.6 F (35.9 C)  Weight: 193 lb 4.8 oz (87.7 kg)  ECOG 1 Physical Exam Constitutional:      General: She is not in acute distress.    Appearance: She is not diaphoretic.  HENT:     Head: Normocephalic and atraumatic.     Nose: Nose normal.     Mouth/Throat:     Pharynx: No oropharyngeal exudate.  Eyes:     General: No scleral icterus.       Left eye: No discharge.     Conjunctiva/sclera: Conjunctivae normal.     Pupils: Pupils are equal, round, and reactive to light.  Neck:     Vascular: No JVD.  Cardiovascular:     Rate and Rhythm: Regular rhythm. Tachycardia present.     Heart sounds: Normal heart sounds. No murmur heard.   Pulmonary:     Effort: Pulmonary effort is normal. No respiratory distress.     Breath sounds: Normal breath sounds. No wheezing or rales.  Chest:     Chest wall: No tenderness.  Abdominal:     General: Bowel sounds are normal. There is no distension.     Palpations: Abdomen is soft. There is no mass.     Tenderness: There is no abdominal tenderness. There is no rebound.  Musculoskeletal:        General: Normal range of motion.     Cervical back: Normal range of motion and neck supple.  Lymphadenopathy:     Cervical: No cervical adenopathy.  Skin:    General: Skin is warm and dry.     Findings: No erythema or rash.  Neurological:     Mental Status: She is alert and oriented to person, place, and time.     Cranial Nerves: No cranial nerve deficit.  Motor: No abnormal muscle tone.     Coordination: Coordination normal.  Psychiatric:        Mood and Affect: Affect normal.           CMP Latest Ref Rng & Units 12/31/2020  Glucose 70 - 99 mg/dL 127(H)  BUN 6 - 20 mg/dL 15  Creatinine 0.44 - 1.00 mg/dL 0.79  Sodium 135 - 145 mmol/L 136  Potassium 3.5 - 5.1 mmol/L 3.5  Chloride 98 - 111 mmol/L 107  CO2 22 - 32 mmol/L 21(L)  Calcium 8.9 - 10.3 mg/dL 9.2  Total Protein 6.5 - 8.1 g/dL 7.2  Total Bilirubin 0.3 - 1.2 mg/dL 0.4   Alkaline Phos 38 - 126 U/L 79  AST 15 - 41 U/L 31  ALT 0 - 44 U/L 24   CBC Latest Ref Rng & Units 12/31/2020  WBC 4.0 - 10.5 K/uL 7.3  Hemoglobin 12.0 - 15.0 g/dL 11.3(L)  Hematocrit 36.0 - 46.0 % 33.3(L)  Platelets 150 - 400 K/uL 328   RADIOGRAPHIC STUDIES: I have personally reviewed the radiological images as listed and agreed with the findings in the report. NM PET Image Restage (PS) Skull Base to Thigh  Result Date: 12/03/2020 CLINICAL DATA:  Subsequent treatment strategy for breast carcinoma. EXAM: NUCLEAR MEDICINE PET SKULL BASE TO THIGH TECHNIQUE: 10.9 mCi F-18 FDG was injected intravenously. Full-ring PET imaging was performed from the skull base to thigh after the radiotracer. CT data was obtained and used for attenuation correction and anatomic localization. Fasting blood glucose: 98 mg/dl COMPARISON:  PET-CT scan 07/20/2020 FINDINGS: Mediastinal blood pool activity: SUV max 2.54 Liver activity: SUV max NA NECK: No hypermetabolic lymph nodes in the neck. Incidental CT findings: none CHEST: No hypermetabolic mediastinal or hilar nodes. No suspicious pulmonary nodules on the CT scan. Incidental CT findings: none ABDOMEN/PELVIS: No abnormal hypermetabolic activity within the liver, pancreas, adrenal glands, or spleen. No hypermetabolic lymph nodes in the abdomen or pelvis. Incidental CT findings: none SKELETON: Resolution hypermetabolic activity at the T7 vertebral body. There is minimal perispinal musculature activity. No new skeletal lesions. Incidental CT findings: none IMPRESSION: 1. Resolution of metabolic activity in the T7 vertebral body metastasis. 2. Paraspinal musculature uptake at T7 related to treatment. 3. No evidence of new or progressive breast cancer metastasis. Electronically Signed   By: Suzy Bouchard M.D.   On: 12/03/2020 15:41        Assessment and plan- Patient is a 40 y.o. female presents for evaluation of newly diagnosed multicentric right breast cancer.   Cancer Staging Breast cancer of upper-outer quadrant of right female breast [facility]) Staging form: Breast, AJCC 8th Edition - Clinical: G2, ER+, PR+, HER2- - Signed by Earlie Server, MD on 04/05/2018 Histologic grading system: 3 grade system - Pathologic stage from 04/04/2018: No Stage Recommended (ypT3, pN2, cM0, G3, ER+, PR-, HER2-) - Signed by Earlie Server, MD on 04/04/2018 Stage prefix: Post-therapy Neoadjuvant therapy: Yes Response to neoadjuvant therapy: No response Method of lymph node assessment: Axillary lymph node dissection Multigene prognostic tests performed: None Histologic grading system: 3 grade system Laterality: Right - Pathologic: Stage IV (rpTX, pNX, cM1, ER+, PR-, HER2+) - Signed by Earlie Server, MD on 12/10/2020 Stage prefix: Recurrence    1. HER2-positive carcinoma of breast ([place])   2. Neuropathy due to chemotherapeutic drug ([place])   3. Metastatic breast cancer ([place])   4. Encounter for monoclonal antibody treatment for malignancy   5. Bone metastasis ([place])   Cancer Staging Breast cancer of  upper-outer quadrant of right female breast [facility]) Staging form: Breast, AJCC 8th Edition - Clinical: G2, ER+, PR+, HER2- - Signed by Earlie Server, MD on 04/05/2018 - Pathologic stage from 04/04/2018: No Stage Recommended (ypT3, pN2, cM0, G3, ER+, PR-, HER2-) - Signed by Earlie Server, MD on 04/04/2018 - Pathologic: Stage IV (rpTX, pNX, cM1, ER+, PR-, HER2+) - Signed by Earlie Server, MD on 12/10/2020  Breast cancer with isolated bone metastasis, stage IV #Initially stage IIIA right breast cancer, ER positive, HER-2 negative status post bilateral oophorectomy, Right mastectomy and right axillary lymph node dissection, status post implant, and implant removal.  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 Estrogen positive, HER-2 positive breast cancer. Status post  radiation to T7  Reviewed and discussed with patient. Proceed with  trastuzumab and Pertuzumab maintenance today. Fulvestrant monthly.  #Tachycardia, chronic intermittent issue for her. Obtain 2D echocardiogram.  #Treatment induced diarrhea, symptoms are stable.  Continue Lomotil as needed. #Bone metastasis, status post radiation. Continue Zometa monthly.  # Back pain, monitor symptoms.  Continue oxycodone 5 mg daily 6 hours as needed #Chemotherapy-induced neuropathy, continue Lyrica and nortriptyline.  #Depression.  Continue Lexapro 20 mg daily.  # Hypokalemia, potassium 3.4.   Patient reports that eating potassium rich food like banana will take potassium tablets as caused her to have right side muscle cramps. She prefers not to take anymore potassium tablets at this point.  Will monitor potassium if too low, will consider IV potassium tablets. Check magnesium level. # Hypocalcemia, calcium has improved.  Recommend patient to take calcium 1541m and vitamin D supplementation. #Sinus drainage, recommend patient to use over-the-counter Flonase nasal spray.  If symptoms does not improve, consider antibiotics.  Follow-up in 3 weeks for the next cycle of trastuzumab and Pertuzumab treatments.  Patient knows to call clinic if she experiences concerning side effects  ZEarlie Server MD, PhD Hematology Oncology [facility]02/10/22

## 2021-01-29 ENCOUNTER — Other Ambulatory Visit: Payer: Self-pay

## 2021-01-29 ENCOUNTER — Ambulatory Visit
Admission: RE | Admit: 2021-01-29 | Discharge: 2021-01-29 | Disposition: A | Discharge status: Home / Self Care | Payer: Medicare HMO | Source: Ambulatory Visit | Attending: Oncology | Admitting: Oncology

## 2021-01-29 DIAGNOSIS — R011 Cardiac murmur, unspecified: Secondary | ICD-10-CM | POA: Diagnosis not present

## 2021-01-29 DIAGNOSIS — Z08 Encounter for follow-up examination after completed treatment for malignant neoplasm: Secondary | ICD-10-CM | POA: Diagnosis not present

## 2021-01-29 DIAGNOSIS — I429 Cardiomyopathy, unspecified: Secondary | ICD-10-CM | POA: Insufficient documentation

## 2021-01-29 DIAGNOSIS — C50919 Malignant neoplasm of unspecified site of unspecified female breast: Secondary | ICD-10-CM

## 2021-01-29 DIAGNOSIS — J449 Chronic obstructive pulmonary disease, unspecified: Secondary | ICD-10-CM | POA: Diagnosis not present

## 2021-01-29 NOTE — Progress Notes (Signed)
*  PRELIMINARY RESULTS* Echocardiogram 2D Echocardiogram has been performed.  Sherrie Sport 01/29/2021, 10:17 AM

## 2021-01-30 LAB — ECHOCARDIOGRAM LIMITED: S' Lateral: 2.85 cm

## 2021-02-10 ENCOUNTER — Ambulatory Visit: Payer: Medicare HMO

## 2021-02-10 ENCOUNTER — Ambulatory Visit: Payer: Medicare HMO | Admitting: Oncology

## 2021-02-10 ENCOUNTER — Other Ambulatory Visit: Payer: Medicare HMO

## 2021-02-11 ENCOUNTER — Inpatient Hospital Stay (HOSPITAL_BASED_OUTPATIENT_CLINIC_OR_DEPARTMENT_OTHER): Payer: Medicare HMO | Admitting: Oncology

## 2021-02-11 ENCOUNTER — Inpatient Hospital Stay: Payer: Medicare HMO | Attending: Oncology

## 2021-02-11 ENCOUNTER — Inpatient Hospital Stay: Payer: Medicare HMO

## 2021-02-11 ENCOUNTER — Encounter: Payer: Self-pay | Admitting: Oncology

## 2021-02-11 VITALS — BP 101/63 | HR 102 | Temp 98.0°F | Resp 18 | Wt 193.3 lb

## 2021-02-11 DIAGNOSIS — Z5112 Encounter for antineoplastic immunotherapy: Secondary | ICD-10-CM | POA: Insufficient documentation

## 2021-02-11 DIAGNOSIS — C50411 Malignant neoplasm of upper-outer quadrant of right female breast: Secondary | ICD-10-CM

## 2021-02-11 DIAGNOSIS — Z5111 Encounter for antineoplastic chemotherapy: Secondary | ICD-10-CM | POA: Diagnosis not present

## 2021-02-11 DIAGNOSIS — C50919 Malignant neoplasm of unspecified site of unspecified female breast: Secondary | ICD-10-CM | POA: Diagnosis not present

## 2021-02-11 DIAGNOSIS — C7951 Secondary malignant neoplasm of bone: Secondary | ICD-10-CM

## 2021-02-11 DIAGNOSIS — C50811 Malignant neoplasm of overlapping sites of right female breast: Secondary | ICD-10-CM

## 2021-02-11 DIAGNOSIS — G893 Neoplasm related pain (acute) (chronic): Secondary | ICD-10-CM

## 2021-02-11 DIAGNOSIS — Z79899 Other long term (current) drug therapy: Secondary | ICD-10-CM | POA: Insufficient documentation

## 2021-02-11 DIAGNOSIS — Z170 Estrogen receptor positive status [ER+]: Secondary | ICD-10-CM

## 2021-02-11 LAB — COMPREHENSIVE METABOLIC PANEL
ALT: 23 U/L (ref 0–44)
AST: 29 U/L (ref 15–41)
Albumin: 4.1 g/dL (ref 3.5–5.0)
Alkaline Phosphatase: 86 U/L (ref 38–126)
Anion gap: 11 (ref 5–15)
BUN: 17 mg/dL (ref 6–20)
CO2: 22 mmol/L (ref 22–32)
Calcium: 9 mg/dL (ref 8.9–10.3)
Chloride: 107 mmol/L (ref 98–111)
Creatinine, Ser: 0.82 mg/dL (ref 0.44–1.00)
GFR, Estimated: >60 mL/min (ref >60)
Glucose, Bld: 84 mg/dL (ref 70–99)
Potassium: 3.5 mmol/L (ref 3.5–5.1)
Sodium: 140 mmol/L (ref 135–145)
Total Bilirubin: 0.3 mg/dL (ref 0.3–1.2)
Total Protein: 7.5 g/dL (ref 6.5–8.1)

## 2021-02-11 LAB — CBC WITH DIFFERENTIAL/PLATELET
Abs Immature Granulocytes: 0.03 10*3/uL (ref 0.00–0.07)
Basophils Absolute: 0.1 10*3/uL (ref 0.0–0.1)
Basophils Relative: 1 %
Eosinophils Absolute: 0.1 10*3/uL (ref 0.0–0.5)
Eosinophils Relative: 2 %
HCT: 33.4 % — ABNORMAL LOW (ref 36.0–46.0)
Hemoglobin: 11.4 g/dL — ABNORMAL LOW (ref 12.0–15.0)
Immature Granulocytes: 0 %
Lymphocytes Relative: 26 %
Lymphs Abs: 2.1 10*3/uL (ref 0.7–4.0)
MCH: 31 pg (ref 26.0–34.0)
MCHC: 34.1 g/dL (ref 30.0–36.0)
MCV: 90.8 fL (ref 80.0–100.0)
Monocytes Absolute: 1 10*3/uL (ref 0.1–1.0)
Monocytes Relative: 12 %
Neutro Abs: 4.7 10*3/uL (ref 1.7–7.7)
Neutrophils Relative %: 59 %
Platelets: 329 10*3/uL (ref 150–400)
RBC: 3.68 MIL/uL — ABNORMAL LOW (ref 3.87–5.11)
RDW: 13.8 % (ref 11.5–15.5)
WBC: 8 10*3/uL (ref 4.0–10.5)
nRBC: 0 % (ref 0.0–0.2)

## 2021-02-11 MED ORDER — SODIUM CHLORIDE 0.9 % IV SOLN
Freq: Once | INTRAVENOUS | Status: AC
Start: 2021-02-11 — End: 2021-02-11
  Filled 2021-02-11: qty 250

## 2021-02-11 MED ORDER — ACETAMINOPHEN 325 MG PO TABS
650.0000 mg | ORAL_TABLET | Freq: Once | ORAL | Status: AC
  Administered 2021-02-11: 650 mg via ORAL
  Filled 2021-02-11: qty 2

## 2021-02-11 MED ORDER — DIPHENHYDRAMINE HCL 25 MG PO CAPS
50.0000 mg | ORAL_CAPSULE | Freq: Once | ORAL | Status: AC
  Administered 2021-02-11: 50 mg via ORAL
  Filled 2021-02-11: qty 2

## 2021-02-11 MED ORDER — SODIUM CHLORIDE 0.9% FLUSH
10.0000 mL | INTRAVENOUS | Status: DC | PRN
  Administered 2021-02-11: 10 mL via INTRAVENOUS
  Filled 2021-02-11: qty 10

## 2021-02-11 MED ORDER — TRASTUZUMAB-ANNS CHEMO 150 MG IV SOLR
6.0000 mg/kg | Freq: Once | INTRAVENOUS | Status: AC
  Administered 2021-02-11: 504 mg via INTRAVENOUS
  Filled 2021-02-11: qty 24

## 2021-02-11 MED ORDER — HEPARIN SOD (PORK) LOCK FLUSH 100 UNIT/ML IV SOLN
500.0000 [IU] | Freq: Once | INTRAVENOUS | Status: AC
  Administered 2021-02-11: 500 [IU] via INTRAVENOUS
  Filled 2021-02-11: qty 5

## 2021-02-11 MED ORDER — HEPARIN SOD (PORK) LOCK FLUSH 100 UNIT/ML IV SOLN
INTRAVENOUS | Status: AC
  Filled 2021-02-11: qty 5

## 2021-02-11 MED ORDER — SODIUM CHLORIDE 0.9 % IV SOLN
420.0000 mg | Freq: Once | INTRAVENOUS | Status: AC
  Administered 2021-02-11: 420 mg via INTRAVENOUS
  Filled 2021-02-11: qty 14

## 2021-02-11 NOTE — Progress Notes (Signed)
HR 102 ok to proceed per MD °

## 2021-02-11 NOTE — Progress Notes (Signed)
Hematology/Oncology Follow Up Note [facility] [phone] [phone]  Patient Care Team: Marguerita Merles, MD as PCP - General (Family Medicine) End, Harrell Gave, MD as PCP - Cardiology (Cardiology) Rico Junker, RN as Oncology Nurse Navigator Earlie Server, MD as Consulting Physician (Oncology) Bary Castilla, Forest Gleason, MD (General Surgery) Noreene Filbert, MD as Referring Physician (Radiation Oncology) Noreene Filbert, MD as Radiation Oncologist (Radiation Oncology) Noreene Filbert, MD as Radiation Oncologist (Radiation Oncology)   Name of the patient: Theresa Chaney  [id number]  01-09-1981   Date of visit: 02/11/21 REASON FOR VISIT Follow up for Assessment prior to chemotherapy treatment of breast cancer  Oncology History Cancer TREATMENT Neoadjuvant ddAC +one dose of Taxol, due to lack of response, surgery was offered. Case was discussed on breast tumor board. 03/19/2018 S/p right mastectomy and right axillary dissection, immediate breast reconstruction with placement of expanders.  ypT3 ypN2, + lymphovascular invasion,  Grade 3, margin is negative, close. ER 90%, PR 0%, HER2 IHC negative.  Also had elective bilateral salpingo-oophorectomy..  # 06/11/2018 s/p 11 cycles Taxol adjuvantly. Tolerated well.  # She has obtained dental clearance for starting Zometa.  S/p Zometa on 6/3/ 2019 # s/p adjuvant right chest wall radiation, finished 10/10/2018 #06/03/2019 underwent elective left prophylactic mastectomy and sentinel lymph node biopsy of left axilla.  # 06/03/2019 underwent elective left prophylactic mastectomy and sentinel lymph node biopsy of left axilla. Pathology negative for malignancy. Status post Mediport removal on 06/03/2019. She also underwent right implant removal on 06/03/2019. She also underwent right implant removal on 06/03/2019. #Negative genetic testing  #  chemotherapy-induced neuropathy, bilateral fingertips and lower extremities.   Patient is on Lyrica and nortriptyline.  Follows up with neurology. She follows up with lymphedema clinic.  #  07/07/2020, MRI thoracic spine without contrast showed lesions involving the T7 posterior elements most concerning for metastatic lesion.  No evidence of epidural tumor.  Minimal thoracic spondylosis without stenosis. MRI was reviewed by me and a PET scan was obtained for further evaluation. 07/20/2020, PET scan showed hypermetabolic metastasis involving the posterior element of T7, no additional evidence of metastasis in the neck, chest, abdomen or pelvis.  # 07/29/2020 T7 lesion biopsy showed metastatic carcinoma, compatible with breast origin.  Receptor status staining showed ER 71-80% positive, PR negative, HER-2 positive IHC 3+ # Patient finished spine radiation on 08/31/2020   positive for COVID-19 on 01/11/2021  INTERVAL HISTORY 40 yo female with above oncology history reviewed by me presents for follow-up of management of metastatic breast cancer.  No new complaints. sensitivity to spicy foods.  Chronic back pain, controlled with current pain regimen. No nausea/vomtiing. Diarrhea symptom is stable, improved.     Review of Systems  Constitutional: Negative for chills, fever, malaise/fatigue and weight loss.  HENT: Negative for sore throat.   Eyes: Negative for redness.  Respiratory: Negative for cough, shortness of breath and wheezing.   Cardiovascular: Negative for chest pain, palpitations and leg swelling.  Gastrointestinal: Negative for abdominal pain, blood in stool, heartburn, nausea and vomiting.  Genitourinary: Negative for dysuria.  Musculoskeletal: Positive for joint pain. Negative for back pain and myalgias.  Skin: Negative for rash.  Neurological: Positive for tingling. Negative for dizziness and tremors.  Endo/Heme/Allergies: Does not bruise/bleed easily.  Psychiatric/Behavioral: Negative for hallucinations.   Sinus drainage and ear pressure  No Known  Allergies  Patient Active Problem List   Diagnosis Date Noted  . Encounter for monoclonal antibody treatment for malignancy 12/10/2020  .  Bone lesion 11/19/2020  . Hypocalcemia 11/19/2020  . Inflammatory arthritis 11/19/2020  . Encounter for antineoplastic chemotherapy 10/29/2020  . Metastatic breast cancer ([place]) 08/31/2020  . Goals of care, counseling/discussion 08/11/2020  . Neuropathy due to chemotherapeutic drug ([place]) 08/11/2020  . HER2-positive carcinoma of breast ([place]) 08/11/2020  . Rheumatoid arteritis ([place]) 10/13/2019  . Bilateral hand swelling 10/03/2019  . Fracture of neck of metacarpal bone 05/14/2019  . Chronic fatigue 04/09/2019  . Polyarthralgia 04/09/2019  . Status post right breast reconstruction 02/26/2019  . Status post right mastectomy 02/26/2019  . Mastalgia 02/15/2019  . Shortness of breath 08/23/2018  . Nonischemic cardiomyopathy ([place]) 08/23/2018  . Preprocedural cardiovascular examination 08/23/2018  . Tachycardia 08/23/2018  . Palpitations 08/23/2018  . Estrogen receptor positive status (ER+) 04/04/2018  . Acquired absence of right breast and nipple 04/03/2018  . Breast cancer of upper-outer quadrant of right female breast ([place]) 03/19/2018  . Family history of cancer   . Malignant neoplasm of overlapping sites of right breast in female, estrogen receptor positive ([place]) 10/19/2017  . Gastroesophageal reflux disease without esophagitis 02/24/2017  . Generalized anxiety disorder 10/03/2014  . Headache 10/03/2014     Past Medical History:  Diagnosis Date  . Anemia   . Arthritis   . BRCA negative 11/26/2017  . Breast cancer ([place]) 10/11/2017   Multifocal, ER positive, PR negative, HER-2 negative. ypT3 ypN2a 8.7 cm, 4/15 nodes  . Cardiomyopathy ([place])    a. 10/2017 Echo: EF 60-65%, no rwma, Gr1 DD, nl RV size/fxn; b. 04/2018 Echo: EF 55-60%, no rwma, Nl RV size/fxn; c. 08/2018 Echo: EF 45%, diff HK, ? HK of antsept wall. Gr1 DD. Mild MR. Mild LAE/RAE. Mod  dil RV.   [name] Chronic bronchitis ([place]) 11/2017  . COPD (chronic obstructive pulmonary disease) ([place])    MILD PER CXR  . Depression   . Family history of cancer   . GERD (gastroesophageal reflux disease)   . Headache    MIGRAINES  . Heart murmur    ASYMPTOMATIC  . Personal history of chemotherapy    current for right breast ca     Past Surgical History:  Procedure Laterality Date  . AXILLARY LYMPH NODE DISSECTION Right 03/19/2018   Procedure: AXILLARY LYMPH NODE DISSECTION;  Surgeon: Robert Bellow, MD;  Location: [facility];  Service: General;  Laterality: Right;  . BREAST BIOPSY Right 10/11/2017   12:30 posterior coil clip invasive mammary carcinoma  . BREAST BIOPSY Right 10/11/2017   11:30 middle depth ribbon clip DCIS  . BREAST BIOPSY Right 10/11/2017   5:30 anterior depth x shape invasive ductal carcinoma  . BREAST IMPLANT REMOVAL Right 06/03/2019   Procedure: REMOVAL OF RIGHT BREAST IMPLANTS;  Surgeon: Wallace Going, DO;  Location: [facility];  Service: Plastics;  Laterality: Right;  . BREAST RECONSTRUCTION WITH PLACEMENT OF TISSUE EXPANDER AND FLEX HD (ACELLULAR HYDRATED DERMIS) Right 03/19/2018   Procedure: BREAST RECONSTRUCTION WITH PLACEMENT OF TISSUE EXPANDER AND FLEX HD (ACELLULAR HYDRATED DERMIS);  Surgeon: Wallace Going, DO;  Location: [facility];  Service: Plastics;  Laterality: Right;  . CARPAL TUNNEL RELEASE Bilateral 2020  . CHOLECYSTECTOMY N/A 04/27/2020   Procedure: LAPAROSCOPIC CHOLECYSTECTOMY WITH INTRAOPERATIVE CHOLANGIOGRAM;  Surgeon: Robert Bellow, MD;  Location: [facility];  Service: General;  Laterality: N/A;  . ESOPHAGOGASTRODUODENOSCOPY (EGD) WITH PROPOFOL N/A 04/17/2020   Procedure: ESOPHAGOGASTRODUODENOSCOPY (EGD) WITH PROPOFOL;  Surgeon: Robert Bellow, MD;  Location: [facility];  Service: Endoscopy;  Laterality: N/A;  with biopsy  .  LAPAROSCOPIC BILATERAL SALPINGO OOPHERECTOMY Bilateral 03/19/2018   Procedure: LAPAROSCOPIC BILATERAL  SALPINGO OOPHORECTOMY;  Surgeon: Benjaman Kindler, MD;  Location: [facility];  Service: Gynecology;  Laterality: Bilateral;  . MASTECTOMY Right 03/2018  . MASTECTOMY W/ SENTINEL NODE BIOPSY Right 03/19/2018   Procedure: MASTECTOMY WITH SENTINEL LYMPH NODE BIOPSY;  Surgeon: Robert Bellow, MD;  Location: [facility];  Service: General;  Laterality: Right;  . PORT-A-CATH REMOVAL Left 06/03/2019   Procedure: REMOVAL PORT-A-CATH;  Surgeon: Robert Bellow, MD;  Location: [facility];  Service: General;  Laterality: Left;  . PORTACATH PLACEMENT Left 10/24/2017   Procedure: INSERTION PORT-A-CATH;  Surgeon: Robert Bellow, MD;  Location: [facility];  Service: General;  Laterality: Left;  . PORTACATH PLACEMENT Right 09/28/2020   Procedure: INSERTION PORT-A-CATH;  Surgeon: Robert Bellow, MD;  Location: [facility];  Service: General;  Laterality: Right;  . REMOVAL OF TISSUE EXPANDER AND PLACEMENT OF IMPLANT Right 07/20/2018   Procedure: REMOVAL OF RIGHT BREAST TISSUE EXPANDER AND PLACEMENT OF IMPLANT;  Surgeon: Wallace Going, DO;  Location: [facility];  Service: Plastics;  Laterality: Right;  . SIMPLE MASTECTOMY WITH AXILLARY SENTINEL NODE BIOPSY Left 06/03/2019   Procedure: SIMPLE MASTECTOMY LEFT;  Surgeon: Robert Bellow, MD;  Location: [facility];  Service: General;  Laterality: Left;    Social History   Socioeconomic History  . Marital status: Married    Spouse name: Not on file  . Number of children: Not on file  . Years of education: Not on file  . Highest education level: Not on file  Occupational History  . Occupation: Occupational psychologist    Comment: Therapist, art   Tobacco Use  . Smoking status: Current Every Day Smoker    Packs/day: 0.50    Years: 18.00    Pack years: 9.00    Types: Cigarettes    Start date: 06/21/2018  . Smokeless tobacco: Never Used  Vaping Use  . Vaping Use: Never used  Substance and Sexual Activity  . Alcohol use: No   . Drug use: No  . Sexual activity: Yes    Birth control/protection: Injection, Other-see comments    Comment: has had hysterectomy  Other Topics Concern  . Not on file  Social History Narrative   Lives at home with husband and daughter   Social Determinants of Health   Financial Resource Strain: Not on file  Food Insecurity: Not on file  Transportation Needs: Not on file  Physical Activity: Not on file  Stress: Not on file  Social Connections: Not on file  Intimate Partner Violence: Not on file     Family History  Problem Relation Age of Onset  . Melanoma Maternal Aunt        other aunts with BCC/SCC/Melanoma  . Diabetes Father   . Hypertension Father   . Hyperlipidemia Father   . Heart attack Father 35       "mild"  . Bladder Cancer Maternal Grandmother   . Cervical cancer Maternal Aunt 64       daughter w/ cervical cancer as well  . Melanoma Maternal Uncle        other uncles with BCC/SCC/Melanoma     Current Outpatient Medications:  .  acetaminophen (TYLENOL) 500 MG tablet, Take 500 mg by mouth every 6 (six) hours as needed., Disp: , Rfl:  .  albuterol (VENTOLIN HFA) 108 (90 Base) MCG/ACT inhaler, Inhale 2 puffs into the lungs every 6 (six) hours as needed  for wheezing or shortness of breath. , Disp: , Rfl:  .  Calcium Carbonate-Vitamin D3 (CALCIUM 600/VITAMIN D) 600-400 MG-UNIT TABS, Take 2 tablets by mouth daily. (Patient taking differently: Take 3 tablets by mouth daily.), Disp: 180 tablet, Rfl: 0 .  cholecalciferol (VITAMIN D3) 25 MCG (1000 UNIT) tablet, Take 1 tablet (1,000 Units total) by mouth daily., Disp: 90 tablet, Rfl: 0 .  cyanocobalamin (,VITAMIN B-12,) 1000 MCG/ML injection, Inject 1,000 mcg into the skin every 30 (thirty) days., Disp: , Rfl:  .  cyclobenzaprine (FLEXERIL) 5 MG tablet, Take 5 mg by mouth 3 (three) times daily as needed for muscle spasms. , Disp: , Rfl:  .  diphenoxylate-atropine (LOMOTIL) 2.5-0.025 MG tablet, Take 1 tablet by mouth 4  (four) times daily as needed for diarrhea or loose stools., Disp: 60 tablet, Rfl: 0 .  escitalopram (LEXAPRO) 20 MG tablet, Take 20 mg by mouth daily. , Disp: , Rfl:  .  esomeprazole (NEXIUM) 40 MG capsule, Take 40 mg by mouth daily before breakfast. , Disp: , Rfl:  .  Eszopiclone 3 MG TABS, Take 3 mg by mouth at bedtime as needed (sleep). , Disp: , Rfl:  .  folic acid (FOLVITE) 1 MG tablet, Take 1 mg by mouth daily., Disp: , Rfl:  .  furosemide (LASIX) 20 MG tablet, Take 1 tablet (20 mg total) by mouth daily as needed. (Patient not taking: No sig reported), Disp: 7 tablet, Rfl: 0 .  ibuprofen (ADVIL) 800 MG tablet, Take 800 mg by mouth every 8 (eight) hours as needed for moderate pain. , Disp: , Rfl:  .  lidocaine-prilocaine (EMLA) cream, Apply to port and cover 1-2 hours prior to appointment, Disp: 30 g, Rfl: 3 .  loperamide (IMODIUM) 2 MG capsule, Take 2 tablets with onset of diarrhea, then take 1 tablet every 2 hours until diarrhea stops. Maximum 8 tablets in 24hours, Disp: 30 capsule, Rfl: 3 .  loratadine (CLARITIN) 10 MG tablet, Take 10 mg by mouth daily. , Disp: , Rfl:  .  LORazepam (ATIVAN) 1 MG tablet, Take 1 mg by mouth 3 (three) times daily., Disp: , Rfl:  .  magic mouthwash SOLN, Take 5 mLs by mouth 4 (four) times daily. (Patient taking differently: Take 5 mLs by mouth 4 (four) times daily as needed.), Disp: 480 mL, Rfl: 1 .  Magnesium 400 MG TABS, Take 400 mg by mouth 2 (two) times daily., Disp: , Rfl:  .  meloxicam (MOBIC) 7.5 MG tablet, Take 7.5 mg by mouth 2 (two) times daily as needed (pain/inflammation.). , Disp: , Rfl:  .  methotrexate (RHEUMATREX) 2.5 MG tablet, Take 20 mg by mouth every Sunday. , Disp: , Rfl:  .  metoprolol succinate (TOPROL-XL) 25 MG 24 hr tablet, Take 37.5 mg by mouth every morning. , Disp: , Rfl:  .  nortriptyline (PAMELOR) 10 MG capsule, Take 30 mg by mouth at bedtime. , Disp: , Rfl:  .  oxyCODONE (OXY IR/ROXICODONE) 5 MG immediate release tablet, Take 1  tablet (5 mg total) by mouth every 6 (six) hours as needed for moderate pain or severe pain., Disp: 60 tablet, Rfl: 0 .  phentermine (ADIPEX-P) 37.5 MG tablet, Take 37.5 mg by mouth daily before breakfast. , Disp: , Rfl:  .  potassium chloride (KLOR-CON) 10 MEQ tablet, Take 1 tablet (10 mEq total) by mouth as directed. Take 2 tablet (20 mEq) daily for 7 days, then 1 tablet (10 mEq) daily until finished., Disp: 45 tablet, Rfl: 0 .  pregabalin (LYRICA) 150 MG capsule, Take 150 mg by mouth 2 (two) times daily., Disp: , Rfl:  .  promethazine (PHENERGAN) 25 MG tablet, Take 1 tablet (25 mg total) by mouth every 8 (eight) hours as needed for nausea or vomiting., Disp: 90 tablet, Rfl: 0 .  pyridOXINE (VITAMIN B-6) 100 MG tablet, Take 100 mg by mouth daily., Disp: , Rfl:  .  sucralfate (CARAFATE) 1 g tablet, Take 1 tablet (1 g total) by mouth 3 (three) times daily. Dissolve in 3-4 tbsp warm water, swish and swallow. (Patient taking differently: Take 1 g by mouth 3 (three) times daily as needed. Dissolve in 3-4 tbsp warm water, swish and swallow.), Disp: 90 tablet, Rfl: 1 .  topiramate (TOPAMAX) 50 MG tablet, Take 50 mg by mouth 2 (two) times daily. , Disp: , Rfl:  .  vitamin C (ASCORBIC ACID) 500 MG tablet, Take 500 mg by mouth daily., Disp: , Rfl:  No current facility-administered medications for this visit.  Facility-Administered Medications Ordered in Other Visits:  .  heparin lock flush 100 unit/mL, 500 Units, Intravenous, Once, Earlie Server, MD .  heparin lock flush 100 unit/mL, 500 Units, Intravenous, Once, Earlie Server, MD .  sodium chloride flush (NS) 0.9 % injection 10 mL, 10 mL, Intravenous, PRN, Earlie Server, MD, 10 mL at 11/19/18 1249 .  sodium chloride flush (NS) 0.9 % injection 10 mL, 10 mL, Intravenous, Once, Earlie Server, MD .  sodium chloride flush (NS) 0.9 % injection 10 mL, 10 mL, Intravenous, PRN, Earlie Server, MD   Physical exam:  There were no vitals filed for this visit.ECOG 1 Physical  Exam Constitutional:      General: She is not in acute distress.    Appearance: She is not diaphoretic.  HENT:     Head: Normocephalic and atraumatic.     Nose: Nose normal.     Mouth/Throat:     Pharynx: No oropharyngeal exudate.  Eyes:     General: No scleral icterus.       Left eye: No discharge.     Conjunctiva/sclera: Conjunctivae normal.     Pupils: Pupils are equal, round, and reactive to light.  Neck:     Vascular: No JVD.  Cardiovascular:     Rate and Rhythm: Regular rhythm. Tachycardia present.     Heart sounds: Normal heart sounds. No murmur heard.   Pulmonary:     Effort: Pulmonary effort is normal. No respiratory distress.     Breath sounds: Normal breath sounds. No wheezing or rales.  Chest:     Chest wall: No tenderness.  Abdominal:     General: Bowel sounds are normal. There is no distension.     Palpations: Abdomen is soft. There is no mass.     Tenderness: There is no abdominal tenderness. There is no rebound.  Musculoskeletal:        General: Normal range of motion.     Cervical back: Normal range of motion and neck supple.  Lymphadenopathy:     Cervical: No cervical adenopathy.  Skin:    General: Skin is warm and dry.     Findings: No erythema or rash.  Neurological:     Mental Status: She is alert and oriented to person, place, and time.     Cranial Nerves: No cranial nerve deficit.     Motor: No abnormal muscle tone.     Coordination: Coordination normal.  Psychiatric:        Mood and Affect: Affect normal.  CMP Latest Ref Rng & Units 01/21/2021  Glucose 70 - 99 mg/dL 143(H)  BUN 6 - 20 mg/dL 15  Creatinine 0.44 - 1.00 mg/dL 0.80  Sodium 135 - 145 mmol/L 140  Potassium 3.5 - 5.1 mmol/L 3.4(L)  Chloride 98 - 111 mmol/L 108  CO2 22 - 32 mmol/L 21(L)  Calcium 8.9 - 10.3 mg/dL 9.0  Total Protein 6.5 - 8.1 g/dL 7.3  Total Bilirubin 0.3 - 1.2 mg/dL 0.5  Alkaline Phos 38 - 126 U/L 84  AST 15 - 41 U/L 33  ALT 0 - 44 U/L 33    CBC Latest Ref Rng & Units 01/21/2021  WBC 4.0 - 10.5 K/uL 7.2  Hemoglobin 12.0 - 15.0 g/dL 11.2(L)  Hematocrit 36.0 - 46.0 % 32.5(L)  Platelets 150 - 400 K/uL 284   RADIOGRAPHIC STUDIES: I have personally reviewed the radiological images as listed and agreed with the findings in the report. NM PET Image Restage (PS) Skull Base to Thigh  Result Date: 12/03/2020 CLINICAL DATA:  Subsequent treatment strategy for breast carcinoma. EXAM: NUCLEAR MEDICINE PET SKULL BASE TO THIGH TECHNIQUE: 10.9 mCi F-18 FDG was injected intravenously. Full-ring PET imaging was performed from the skull base to thigh after the radiotracer. CT data was obtained and used for attenuation correction and anatomic localization. Fasting blood glucose: 98 mg/dl COMPARISON:  PET-CT scan 07/20/2020 FINDINGS: Mediastinal blood pool activity: SUV max 2.54 Liver activity: SUV max NA NECK: No hypermetabolic lymph nodes in the neck. Incidental CT findings: none CHEST: No hypermetabolic mediastinal or hilar nodes. No suspicious pulmonary nodules on the CT scan. Incidental CT findings: none ABDOMEN/PELVIS: No abnormal hypermetabolic activity within the liver, pancreas, adrenal glands, or spleen. No hypermetabolic lymph nodes in the abdomen or pelvis. Incidental CT findings: none SKELETON: Resolution hypermetabolic activity at the T7 vertebral body. There is minimal perispinal musculature activity. No new skeletal lesions. Incidental CT findings: none IMPRESSION: 1. Resolution of metabolic activity in the T7 vertebral body metastasis. 2. Paraspinal musculature uptake at T7 related to treatment. 3. No evidence of new or progressive breast cancer metastasis. Electronically Signed   By: Suzy Bouchard M.D.   On: 12/03/2020 15:41   ECHOCARDIOGRAM LIMITED  Result Date: 01/30/2021    ECHOCARDIOGRAM LIMITED REPORT   Patient Name:   CLEA DUBACH Date of Exam: 01/29/2021 Medical Rec #:  [id number]      Height:       67.0 in Accession #:     [id number]     Weight:       193.3 lb Date of Birth:  14-Nov-1981      BSA:          1.993 m Patient Age:    68 years       BP:           112/70 mmHg Patient Gender: F              HR:           96 bpm. Exam Location:  [facility] Procedure: Limited Echo Indications:     Chemotherapy follow- up evaluation V 67.2  History:         Patient has prior history of Echocardiogram examinations, most                  recent 09/14/2020. Cardiomyopathy, COPD; Signs/Symptoms:Murmur.  Sonographer:     Sherrie Sport RDCS (AE) Referring Phys:  [id number] [place] Diagnosing Phys: Serafina Royals MD IMPRESSIONS  1. Left ventricular ejection  fraction, by estimation, is 55 to 60%. The left ventricle has normal function. The left ventricle has no regional wall motion abnormalities. Left ventricular diastolic function could not be evaluated.  2. Right ventricular systolic function is normal. The right ventricular size is normal.  3. The mitral valve is normal in structure. Trivial mitral valve regurgitation.  4. The aortic valve is normal in structure. Aortic valve regurgitation is not visualized. FINDINGS  Left Ventricle: Left ventricular ejection fraction, by estimation, is 55 to 60%. The left ventricle has normal function. The left ventricle has no regional wall motion abnormalities. The left ventricular internal cavity size was normal in size. There is  no left ventricular hypertrophy. Left ventricular diastolic function could not be evaluated. Right Ventricle: The right ventricular size is normal. No increase in right ventricular wall thickness. Right ventricular systolic function is normal. Left Atrium: Left atrial size was normal in size. Right Atrium: Right atrial size was normal in size. Pericardium: Trivial pericardial effusion is present. Mitral Valve: The mitral valve is normal in structure. Trivial mitral valve regurgitation. Tricuspid Valve: The tricuspid valve is normal in structure. Tricuspid valve regurgitation is trivial. Aortic  Valve: The aortic valve is normal in structure. Aortic valve regurgitation is not visualized. Pulmonic Valve: The pulmonic valve was normal in structure. Pulmonic valve regurgitation is not visualized. Aorta: The aortic root and ascending aorta are structurally normal, with no evidence of dilitation. IAS/Shunts: No atrial level shunt detected by color flow Doppler. LEFT VENTRICLE PLAX 2D LVIDd:         4.08 cm LVIDs:         2.85 cm LV PW:         0.83 cm LV IVS:        0.87 cm LVOT diam:     2.00 cm  3D Volume EF: LVOT Area:     3.14 cm 3D EF:        27 %                         LV EDV:       156 ml                         LV ESV:       113 ml                         LV SV:        42 ml LEFT ATRIUM             Index       RIGHT ATRIUM           Index LA diam:        2.80 cm 1.40 cm/m  RA Area:     11.80 cm LA Vol (A2C):   22.8 ml 11.44 ml/m RA Volume:   29.10 ml  14.60 ml/m LA Vol (A4C):   12.9 ml 6.47 ml/m LA Biplane Vol: 17.7 ml 8.88 ml/m                        PULMONIC VALVE AORTA                 PV Vmax:        0.65 m/s Ao Root diam: 2.90 cm PV Peak grad:   1.7 mmHg  RVOT Peak grad: 3 mmHg  TRICUSPID VALVE TR Peak grad:   16.2 mmHg TR Vmax:        201.00 cm/s  SHUNTS Systemic Diam: 2.00 cm Serafina Royals MD Electronically signed by Serafina Royals MD Signature Date/Time: 01/30/2021/7:54:56 AM    Final         Assessment and plan- Patient is a 40 y.o. female presents for evaluation of newly diagnosed multicentric right breast cancer.  Cancer Staging Breast cancer of upper-outer quadrant of right female breast [facility]) Staging form: Breast, AJCC 8th Edition - Clinical: G2, ER+, PR+, HER2- - Signed by Earlie Server, MD on 04/05/2018 Histologic grading system: 3 grade system - Pathologic stage from 04/04/2018: No Stage Recommended (ypT3, pN2, cM0, G3, ER+, PR-, HER2-) - Signed by Earlie Server, MD on 04/04/2018 Stage prefix: Post-therapy Neoadjuvant therapy: Yes Response to neoadjuvant  therapy: No response Method of lymph node assessment: Axillary lymph node dissection Multigene prognostic tests performed: None Histologic grading system: 3 grade system Laterality: Right - Pathologic: Stage IV (rpTX, pNX, cM1, ER+, PR-, HER2+) - Signed by Earlie Server, MD on 12/10/2020 Stage prefix: Recurrence    1. Metastatic breast cancer ([place])   2. HER2-positive carcinoma of breast ([place])   3. Encounter for monoclonal antibody treatment for malignancy   4. Bone metastasis ([place])   5. Hypocalcemia   6. Neoplasm related pain    Breast cancer with isolated bone metastasis, stage IV #Initially stage IIIA right breast cancer, ER positive, HER-2 negative status post bilateral oophorectomy, Right mastectomy and right axillary lymph node dissection, status post implant, and implant removal.  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 Estrogen positive, HER-2 positive breast cancer. Status post  radiation to T7  Labs are reviewed and discussed with patient. Proceed with trastuzumab and Pertuzumab maintenance today. Fulvestrant monthly.  #Tachycardia, chronic intermittent issue for her. 01/29/21 2D echocardiogram LVEF 55%-60%. Monitor.   #Treatment induced diarrhea, symptom is stable.  Continue Lomotil as needed. #Bone metastasis, status post radiation. Continue Zometa monthly.  # Back pain, monitor symptoms.  continue oxycodone 5 mg daily 6 hours as needed #Chemotherapy-induced neuropathy, on Lyrica and nortriptyline, continue.  #Depression.  Continue Lexapro 20 mg daily.  # Hypokalemia, potassium 3.5, can not tolerate oral potassium supplementation.     # Hypocalcemia, calcium has normalized.  Recommend patient to take calcium 1532m and vitamin D supplementation.   Follow-up in 3 weeks for the next cycle of trastuzumab and Pertuzumab treatments.  Patient knows to call clinic if she experiences concerning  side effects  ZEarlie Server MD, PhD [facility]ology [facility] [place]03/03/22

## 2021-02-11 NOTE — Progress Notes (Signed)
Patient here for follow up. Pt has sensitivity to spicy foods.

## 2021-02-18 ENCOUNTER — Inpatient Hospital Stay: Payer: Medicare HMO

## 2021-02-18 VITALS — BP 110/73 | HR 99 | Temp 96.2°F | Resp 20

## 2021-02-18 DIAGNOSIS — C50411 Malignant neoplasm of upper-outer quadrant of right female breast: Secondary | ICD-10-CM

## 2021-02-18 DIAGNOSIS — Z170 Estrogen receptor positive status [ER+]: Secondary | ICD-10-CM

## 2021-02-18 DIAGNOSIS — Z5111 Encounter for antineoplastic chemotherapy: Secondary | ICD-10-CM | POA: Diagnosis not present

## 2021-02-18 LAB — CBC WITH DIFFERENTIAL/PLATELET
Abs Immature Granulocytes: 0.01 10*3/uL (ref 0.00–0.07)
Basophils Absolute: 0 10*3/uL (ref 0.0–0.1)
Basophils Relative: 1 %
Eosinophils Absolute: 0.1 10*3/uL (ref 0.0–0.5)
Eosinophils Relative: 2 %
HCT: 33.4 % — ABNORMAL LOW (ref 36.0–46.0)
Hemoglobin: 11 g/dL — ABNORMAL LOW (ref 12.0–15.0)
Immature Granulocytes: 0 %
Lymphocytes Relative: 30 %
Lymphs Abs: 2 10*3/uL (ref 0.7–4.0)
MCH: 30.2 pg (ref 26.0–34.0)
MCHC: 32.9 g/dL (ref 30.0–36.0)
MCV: 91.8 fL (ref 80.0–100.0)
Monocytes Absolute: 0.5 10*3/uL (ref 0.1–1.0)
Monocytes Relative: 8 %
Neutro Abs: 3.9 10*3/uL (ref 1.7–7.7)
Neutrophils Relative %: 59 %
Platelets: 336 10*3/uL (ref 150–400)
RBC: 3.64 MIL/uL — ABNORMAL LOW (ref 3.87–5.11)
RDW: 14.1 % (ref 11.5–15.5)
Smear Review: NORMAL
WBC: 6.5 10*3/uL (ref 4.0–10.5)
nRBC: 0 % (ref 0.0–0.2)

## 2021-02-18 LAB — COMPREHENSIVE METABOLIC PANEL
ALT: 22 U/L (ref 0–44)
AST: 31 U/L (ref 15–41)
Albumin: 4.1 g/dL (ref 3.5–5.0)
Alkaline Phosphatase: 74 U/L (ref 38–126)
Anion gap: 7 (ref 5–15)
BUN: 12 mg/dL (ref 6–20)
CO2: 22 mmol/L (ref 22–32)
Calcium: 9 mg/dL (ref 8.9–10.3)
Chloride: 110 mmol/L (ref 98–111)
Creatinine, Ser: 0.83 mg/dL (ref 0.44–1.00)
GFR, Estimated: >60 mL/min (ref >60)
Glucose, Bld: 117 mg/dL — ABNORMAL HIGH (ref 70–99)
Potassium: 3.5 mmol/L (ref 3.5–5.1)
Sodium: 139 mmol/L (ref 135–145)
Total Bilirubin: 0.6 mg/dL (ref 0.3–1.2)
Total Protein: 7.1 g/dL (ref 6.5–8.1)

## 2021-02-18 MED ORDER — FULVESTRANT 250 MG/5ML IM SOLN
500.0000 mg | Freq: Once | INTRAMUSCULAR | Status: AC
  Administered 2021-02-18: 500 mg via INTRAMUSCULAR
  Filled 2021-02-18: qty 10

## 2021-02-18 MED ORDER — HEPARIN SOD (PORK) LOCK FLUSH 100 UNIT/ML IV SOLN
INTRAVENOUS | Status: AC
  Filled 2021-02-18: qty 5

## 2021-02-18 MED ORDER — ZOLEDRONIC ACID 4 MG/100ML IV SOLN
4.0000 mg | Freq: Once | INTRAVENOUS | Status: AC
  Administered 2021-02-18: 4 mg via INTRAVENOUS
  Filled 2021-02-18: qty 100

## 2021-02-18 MED ORDER — HEPARIN SOD (PORK) LOCK FLUSH 100 UNIT/ML IV SOLN
500.0000 [IU] | Freq: Once | INTRAVENOUS | Status: AC
  Administered 2021-02-18: 500 [IU] via INTRAVENOUS
  Filled 2021-02-18: qty 5

## 2021-02-18 MED ORDER — SODIUM CHLORIDE 0.9% FLUSH
10.0000 mL | Freq: Once | INTRAVENOUS | Status: AC
  Administered 2021-02-18: 10 mL via INTRAVENOUS
  Filled 2021-02-18: qty 10

## 2021-02-18 MED ORDER — SODIUM CHLORIDE 0.9 % IV SOLN
INTRAVENOUS | Status: DC
  Filled 2021-02-18: qty 250

## 2021-03-04 ENCOUNTER — Telehealth: Payer: Self-pay | Admitting: *Deleted

## 2021-03-04 ENCOUNTER — Encounter: Payer: Self-pay | Admitting: Oncology

## 2021-03-04 ENCOUNTER — Inpatient Hospital Stay: Payer: Medicare HMO

## 2021-03-04 ENCOUNTER — Inpatient Hospital Stay (HOSPITAL_BASED_OUTPATIENT_CLINIC_OR_DEPARTMENT_OTHER): Payer: Medicare HMO | Admitting: Oncology

## 2021-03-04 VITALS — BP 116/68 | HR 94 | Temp 97.1°F | Wt 193.0 lb

## 2021-03-04 DIAGNOSIS — G893 Neoplasm related pain (acute) (chronic): Secondary | ICD-10-CM | POA: Diagnosis not present

## 2021-03-04 DIAGNOSIS — G620 Drug-induced polyneuropathy: Secondary | ICD-10-CM

## 2021-03-04 DIAGNOSIS — C7951 Secondary malignant neoplasm of bone: Secondary | ICD-10-CM

## 2021-03-04 DIAGNOSIS — C50911 Malignant neoplasm of unspecified site of right female breast: Secondary | ICD-10-CM | POA: Diagnosis not present

## 2021-03-04 DIAGNOSIS — T451X5A Adverse effect of antineoplastic and immunosuppressive drugs, initial encounter: Secondary | ICD-10-CM

## 2021-03-04 DIAGNOSIS — C50411 Malignant neoplasm of upper-outer quadrant of right female breast: Secondary | ICD-10-CM

## 2021-03-04 DIAGNOSIS — Z95828 Presence of other vascular implants and grafts: Secondary | ICD-10-CM

## 2021-03-04 DIAGNOSIS — C50919 Malignant neoplasm of unspecified site of unspecified female breast: Secondary | ICD-10-CM

## 2021-03-04 DIAGNOSIS — Z5112 Encounter for antineoplastic immunotherapy: Secondary | ICD-10-CM

## 2021-03-04 DIAGNOSIS — Z170 Estrogen receptor positive status [ER+]: Secondary | ICD-10-CM

## 2021-03-04 DIAGNOSIS — C50811 Malignant neoplasm of overlapping sites of right female breast: Secondary | ICD-10-CM

## 2021-03-04 DIAGNOSIS — Z5111 Encounter for antineoplastic chemotherapy: Secondary | ICD-10-CM | POA: Diagnosis not present

## 2021-03-04 LAB — CBC WITH DIFFERENTIAL/PLATELET
Abs Immature Granulocytes: 0.01 10*3/uL (ref 0.00–0.07)
Basophils Absolute: 0.1 10*3/uL (ref 0.0–0.1)
Basophils Relative: 1 %
Eosinophils Absolute: 0.1 10*3/uL (ref 0.0–0.5)
Eosinophils Relative: 2 %
HCT: 33.2 % — ABNORMAL LOW (ref 36.0–46.0)
Hemoglobin: 11.1 g/dL — ABNORMAL LOW (ref 12.0–15.0)
Immature Granulocytes: 0 %
Lymphocytes Relative: 24 %
Lymphs Abs: 1.7 10*3/uL (ref 0.7–4.0)
MCH: 30.3 pg (ref 26.0–34.0)
MCHC: 33.4 g/dL (ref 30.0–36.0)
MCV: 90.7 fL (ref 80.0–100.0)
Monocytes Absolute: 0.7 10*3/uL (ref 0.1–1.0)
Monocytes Relative: 10 %
Neutro Abs: 4.5 10*3/uL (ref 1.7–7.7)
Neutrophils Relative %: 63 %
Platelets: 309 10*3/uL (ref 150–400)
RBC: 3.66 MIL/uL — ABNORMAL LOW (ref 3.87–5.11)
RDW: 13.9 % (ref 11.5–15.5)
WBC: 7.1 10*3/uL (ref 4.0–10.5)
nRBC: 0 % (ref 0.0–0.2)

## 2021-03-04 LAB — COMPREHENSIVE METABOLIC PANEL
ALT: 20 U/L (ref 0–44)
AST: 29 U/L (ref 15–41)
Albumin: 4.1 g/dL (ref 3.5–5.0)
Alkaline Phosphatase: 79 U/L (ref 38–126)
Anion gap: 10 (ref 5–15)
BUN: 17 mg/dL (ref 6–20)
CO2: 21 mmol/L — ABNORMAL LOW (ref 22–32)
Calcium: 8.7 mg/dL — ABNORMAL LOW (ref 8.9–10.3)
Chloride: 106 mmol/L (ref 98–111)
Creatinine, Ser: 0.84 mg/dL (ref 0.44–1.00)
GFR, Estimated: >60 mL/min (ref >60)
Glucose, Bld: 111 mg/dL — ABNORMAL HIGH (ref 70–99)
Potassium: 3.7 mmol/L (ref 3.5–5.1)
Sodium: 137 mmol/L (ref 135–145)
Total Bilirubin: 0.5 mg/dL (ref 0.3–1.2)
Total Protein: 7.5 g/dL (ref 6.5–8.1)

## 2021-03-04 MED ORDER — TRASTUZUMAB-ANNS CHEMO 150 MG IV SOLR
6.0000 mg/kg | Freq: Once | INTRAVENOUS | Status: AC
  Administered 2021-03-04: 504 mg via INTRAVENOUS
  Filled 2021-03-04: qty 24

## 2021-03-04 MED ORDER — HEPARIN SOD (PORK) LOCK FLUSH 100 UNIT/ML IV SOLN
INTRAVENOUS | Status: AC
  Filled 2021-03-04: qty 5

## 2021-03-04 MED ORDER — MUPIROCIN CALCIUM 2 % NA OINT: 1.0000 "application " | TOPICAL_OINTMENT | Freq: Two times a day (BID) | NASAL | 0 refills | Status: DC

## 2021-03-04 MED ORDER — HEPARIN SOD (PORK) LOCK FLUSH 100 UNIT/ML IV SOLN
500.0000 [IU] | Freq: Once | INTRAVENOUS | Status: DC | PRN
  Filled 2021-03-04: qty 5

## 2021-03-04 MED ORDER — SODIUM CHLORIDE 0.9 % IV SOLN
Freq: Once | INTRAVENOUS | Status: AC
  Filled 2021-03-04: qty 250

## 2021-03-04 MED ORDER — SODIUM CHLORIDE 0.9 % IV SOLN
420.0000 mg | Freq: Once | INTRAVENOUS | Status: AC
  Administered 2021-03-04: 420 mg via INTRAVENOUS
  Filled 2021-03-04: qty 14

## 2021-03-04 MED ORDER — SODIUM CHLORIDE 0.9% FLUSH
10.0000 mL | Freq: Once | INTRAVENOUS | Status: AC
  Administered 2021-03-04: 10 mL via INTRAVENOUS
  Filled 2021-03-04: qty 10

## 2021-03-04 MED ORDER — DIPHENHYDRAMINE HCL 25 MG PO CAPS
50.0000 mg | ORAL_CAPSULE | Freq: Once | ORAL | Status: AC
  Administered 2021-03-04: 50 mg via ORAL
  Filled 2021-03-04: qty 2

## 2021-03-04 MED ORDER — ACETAMINOPHEN 325 MG PO TABS
650.0000 mg | ORAL_TABLET | Freq: Once | ORAL | Status: AC
  Administered 2021-03-04: 650 mg via ORAL
  Filled 2021-03-04: qty 2

## 2021-03-04 MED ORDER — HEPARIN SOD (PORK) LOCK FLUSH 100 UNIT/ML IV SOLN
500.0000 [IU] | Freq: Once | INTRAVENOUS | Status: AC
  Administered 2021-03-04: 500 [IU] via INTRAVENOUS
  Filled 2021-03-04: qty 5

## 2021-03-04 NOTE — Progress Notes (Signed)
Hematology/Oncology Follow Up Note [place] [phone] [phone]  Patient Care Team: Marguerita Merles, MD as PCP - General (Family Medicine) End, Harrell Gave, MD as PCP - Cardiology (Cardiology) Rico Junker, RN as Oncology Nurse Navigator Earlie Server, MD as Consulting Physician (Oncology) Bary Castilla, Forest Gleason, MD (General Surgery) Noreene Filbert, MD as Referring Physician (Radiation Oncology) Noreene Filbert, MD as Radiation Oncologist (Radiation Oncology) Noreene Filbert, MD as Radiation Oncologist (Radiation Oncology)   Name of the patient: Theresa Chaney  [id number]  March 24, 1981   Date of visit: 03/04/21 REASON FOR VISIT Follow up for Assessment prior to chemotherapy treatment of breast cancer  Oncology History Cancer TREATMENT Neoadjuvant ddAC +one dose of Taxol, due to lack of response, surgery was offered. Case was discussed on breast tumor board. 03/19/2018 S/p right mastectomy and right axillary dissection, immediate breast reconstruction with placement of expanders.  ypT3 ypN2, + lymphovascular invasion,  Grade 3, margin is negative, close. ER 90%, PR 0%, HER2 IHC negative.  Also had elective bilateral salpingo-oophorectomy..  # 06/11/2018 s/p 11 cycles Taxol adjuvantly. Tolerated well.  # She has obtained dental clearance for starting Zometa.  S/p Zometa on 6/3/ 2019 # s/p adjuvant right chest wall radiation, finished 10/10/2018 #06/03/2019 underwent elective left prophylactic mastectomy and sentinel lymph node biopsy of left axilla.  # 06/03/2019 underwent elective left prophylactic mastectomy and sentinel lymph node biopsy of left axilla. Pathology negative for malignancy. Status post Mediport removal on 06/03/2019. She also underwent right implant removal on 06/03/2019. She also underwent right implant removal on 06/03/2019. #Negative genetic testing  #  chemotherapy-induced neuropathy, bilateral fingertips and lower extremities.   Patient is on Lyrica and nortriptyline.  Follows up with neurology. She follows up with lymphedema clinic.  #  07/07/2020, MRI thoracic spine without contrast showed lesions involving the T7 posterior elements most concerning for metastatic lesion.  No evidence of epidural tumor.  Minimal thoracic spondylosis without stenosis. MRI was reviewed by me and a PET scan was obtained for further evaluation. 07/20/2020, PET scan showed hypermetabolic metastasis involving the posterior element of T7, no additional evidence of metastasis in the neck, chest, abdomen or pelvis.  # 07/29/2020 T7 lesion biopsy showed metastatic carcinoma, compatible with breast origin.  Receptor status staining showed ER 71-80% positive, PR negative, HER-2 positive IHC 3+ # Patient finished spine radiation on 08/31/2020   positive for COVID-19 on 01/11/2021  INTERVAL HISTORY 40 yo female with above oncology history reviewed by me presents for follow-up of management of metastatic breast cancer.  She reports having sores in her nose which she previously used Mupirocin nasal  Chronic back pain, intermittent, she takes pain medication as needed.  No nausea/vomtiing. Diarrhea symptom is stable, controlled with Imodium.    Review of Systems  Constitutional: Negative for chills, fever, malaise/fatigue and weight loss.  HENT: Negative for sore throat.   Eyes: Negative for redness.  Respiratory: Negative for cough, shortness of breath and wheezing.   Cardiovascular: Negative for chest pain, palpitations and leg swelling.  Gastrointestinal: Negative for abdominal pain, blood in stool, heartburn, nausea and vomiting.  Genitourinary: Negative for dysuria.  Musculoskeletal: Positive for joint pain. Negative for back pain and myalgias.  Skin: Negative for rash.  Neurological: Positive for tingling. Negative for dizziness and tremors.  Endo/Heme/Allergies: Does not bruise/bleed easily.  Psychiatric/Behavioral: Negative for  hallucinations.    No Known Allergies  Patient Active Problem List   Diagnosis Date Noted  . Encounter for monoclonal antibody treatment  for malignancy 12/10/2020  . Bone lesion 11/19/2020  . Hypocalcemia 11/19/2020  . Inflammatory arthritis 11/19/2020  . Encounter for antineoplastic chemotherapy 10/29/2020  . Metastatic breast cancer ([place]) 08/31/2020  . Goals of care, counseling/discussion 08/11/2020  . Neuropathy due to chemotherapeutic drug ([place]) 08/11/2020  . HER2-positive carcinoma of breast ([place]) 08/11/2020  . Rheumatoid arteritis ([place]) 10/13/2019  . Bilateral hand swelling 10/03/2019  . Fracture of neck of metacarpal bone 05/14/2019  . Chronic fatigue 04/09/2019  . Polyarthralgia 04/09/2019  . Status post right breast reconstruction 02/26/2019  . Status post right mastectomy 02/26/2019  . Mastalgia 02/15/2019  . Shortness of breath 08/23/2018  . Nonischemic cardiomyopathy ([place]) 08/23/2018  . Preprocedural cardiovascular examination 08/23/2018  . Tachycardia 08/23/2018  . Palpitations 08/23/2018  . Estrogen receptor positive status (ER+) 04/04/2018  . Acquired absence of right breast and nipple 04/03/2018  . Breast cancer of upper-outer quadrant of right female breast ([place]) 03/19/2018  . Family history of cancer   . Malignant neoplasm of overlapping sites of right breast in female, estrogen receptor positive ([place]) 10/19/2017  . Gastroesophageal reflux disease without esophagitis 02/24/2017  . Generalized anxiety disorder 10/03/2014  . Headache 10/03/2014     Past Medical History:  Diagnosis Date  . Anemia   . Arthritis   . BRCA negative 11/26/2017  . Breast cancer ([place]) 10/11/2017   Multifocal, ER positive, PR negative, HER-2 negative. ypT3 ypN2a 8.7 cm, 4/15 nodes  . Cardiomyopathy ([place])    a. 10/2017 Echo: EF 60-65%, no rwma, Gr1 DD, nl RV size/fxn; b. 04/2018 Echo: EF 55-60%, no rwma, Nl RV size/fxn; c. 08/2018 Echo: EF 45%, diff HK, ? HK of antsept wall. Gr1  DD. Mild MR. Mild LAE/RAE. Mod dil RV.   [name] Chronic bronchitis ([place]) 11/2017  . COPD (chronic obstructive pulmonary disease) ([place])    MILD PER CXR  . Depression   . Family history of cancer   . GERD (gastroesophageal reflux disease)   . Headache    MIGRAINES  . Heart murmur    ASYMPTOMATIC  . Personal history of chemotherapy    current for right breast ca     Past Surgical History:  Procedure Laterality Date  . AXILLARY LYMPH NODE DISSECTION Right 03/19/2018   Procedure: AXILLARY LYMPH NODE DISSECTION;  Surgeon: Robert Bellow, MD;  Location: [facility];  Service: General;  Laterality: Right;  . BREAST BIOPSY Right 10/11/2017   12:30 posterior coil clip invasive mammary carcinoma  . BREAST BIOPSY Right 10/11/2017   11:30 middle depth ribbon clip DCIS  . BREAST BIOPSY Right 10/11/2017   5:30 anterior depth x shape invasive ductal carcinoma  . BREAST IMPLANT REMOVAL Right 06/03/2019   Procedure: REMOVAL OF RIGHT BREAST IMPLANTS;  Surgeon: Wallace Going, DO;  Location: [facility];  Service: Plastics;  Laterality: Right;  . BREAST RECONSTRUCTION WITH PLACEMENT OF TISSUE EXPANDER AND FLEX HD (ACELLULAR HYDRATED DERMIS) Right 03/19/2018   Procedure: BREAST RECONSTRUCTION WITH PLACEMENT OF TISSUE EXPANDER AND FLEX HD (ACELLULAR HYDRATED DERMIS);  Surgeon: Wallace Going, DO;  Location: [facility];  Service: Plastics;  Laterality: Right;  . CARPAL TUNNEL RELEASE Bilateral 2020  . CHOLECYSTECTOMY N/A 04/27/2020   Procedure: LAPAROSCOPIC CHOLECYSTECTOMY WITH INTRAOPERATIVE CHOLANGIOGRAM;  Surgeon: Robert Bellow, MD;  Location: [facility];  Service: General;  Laterality: N/A;  . ESOPHAGOGASTRODUODENOSCOPY (EGD) WITH PROPOFOL N/A 04/17/2020   Procedure: ESOPHAGOGASTRODUODENOSCOPY (EGD) WITH PROPOFOL;  Surgeon: Robert Bellow, MD;  Location: [facility];  Service: Endoscopy;  Laterality: N/A;  with biopsy  . LAPAROSCOPIC BILATERAL SALPINGO OOPHERECTOMY Bilateral 03/19/2018    Procedure: LAPAROSCOPIC BILATERAL SALPINGO OOPHORECTOMY;  Surgeon: Benjaman Kindler, MD;  Location: [facility];  Service: Gynecology;  Laterality: Bilateral;  . MASTECTOMY Right 03/2018  . MASTECTOMY W/ SENTINEL NODE BIOPSY Right 03/19/2018   Procedure: MASTECTOMY WITH SENTINEL LYMPH NODE BIOPSY;  Surgeon: Robert Bellow, MD;  Location: [facility];  Service: General;  Laterality: Right;  . PORT-A-CATH REMOVAL Left 06/03/2019   Procedure: REMOVAL PORT-A-CATH;  Surgeon: Robert Bellow, MD;  Location: [facility];  Service: General;  Laterality: Left;  . PORTACATH PLACEMENT Left 10/24/2017   Procedure: INSERTION PORT-A-CATH;  Surgeon: Robert Bellow, MD;  Location: [facility];  Service: General;  Laterality: Left;  . PORTACATH PLACEMENT Right 09/28/2020   Procedure: INSERTION PORT-A-CATH;  Surgeon: Robert Bellow, MD;  Location: [facility];  Service: General;  Laterality: Right;  . REMOVAL OF TISSUE EXPANDER AND PLACEMENT OF IMPLANT Right 07/20/2018   Procedure: REMOVAL OF RIGHT BREAST TISSUE EXPANDER AND PLACEMENT OF IMPLANT;  Surgeon: Wallace Going, DO;  Location: [facility];  Service: Plastics;  Laterality: Right;  . SIMPLE MASTECTOMY WITH AXILLARY SENTINEL NODE BIOPSY Left 06/03/2019   Procedure: SIMPLE MASTECTOMY LEFT;  Surgeon: Robert Bellow, MD;  Location: [facility];  Service: General;  Laterality: Left;    Social History   Socioeconomic History  . Marital status: Married    Spouse name: Not on file  . Number of children: Not on file  . Years of education: Not on file  . Highest education level: Not on file  Occupational History  . Occupation: Occupational psychologist    Comment: Therapist, art   Tobacco Use  . Smoking status: Current Every Day Smoker    Packs/day: 0.50    Years: 18.00    Pack years: 9.00    Types: Cigarettes    Start date: 06/21/2018  . Smokeless tobacco: Never Used  Vaping Use  . Vaping Use: Never used  Substance and  Sexual Activity  . Alcohol use: No  . Drug use: No  . Sexual activity: Yes    Birth control/protection: Injection, Other-see comments    Comment: has had hysterectomy  Other Topics Concern  . Not on file  Social History Narrative   Lives at home with husband and daughter   Social Determinants of Health   Financial Resource Strain: Not on file  Food Insecurity: Not on file  Transportation Needs: Not on file  Physical Activity: Not on file  Stress: Not on file  Social Connections: Not on file  Intimate Partner Violence: Not on file     Family History  Problem Relation Age of Onset  . Melanoma Maternal Aunt        other aunts with BCC/SCC/Melanoma  . Diabetes Father   . Hypertension Father   . Hyperlipidemia Father   . Heart attack Father 55       "mild"  . Bladder Cancer Maternal Grandmother   . Cervical cancer Maternal Aunt 64       daughter w/ cervical cancer as well  . Melanoma Maternal Uncle        other uncles with BCC/SCC/Melanoma     Current Outpatient Medications:  .  acetaminophen (TYLENOL) 500 MG tablet, Take 500 mg by mouth every 6 (six) hours as needed., Disp: , Rfl:  .  albuterol (VENTOLIN HFA) 108 (90 Base) MCG/ACT inhaler, Inhale 2 puffs into the lungs every 6 (  six) hours as needed for wheezing or shortness of breath. , Disp: , Rfl:  .  Calcium Carbonate-Vitamin D3 (CALCIUM 600/VITAMIN D) 600-400 MG-UNIT TABS, Take 2 tablets by mouth daily. (Patient taking differently: Take 3 tablets by mouth daily.), Disp: 180 tablet, Rfl: 0 .  cholecalciferol (VITAMIN D3) 25 MCG (1000 UNIT) tablet, Take 1 tablet (1,000 Units total) by mouth daily., Disp: 90 tablet, Rfl: 0 .  cyanocobalamin (,VITAMIN B-12,) 1000 MCG/ML injection, Inject 1,000 mcg into the skin every 30 (thirty) days., Disp: , Rfl:  .  cyclobenzaprine (FLEXERIL) 5 MG tablet, Take 5 mg by mouth 3 (three) times daily as needed for muscle spasms. , Disp: , Rfl:  .  diphenoxylate-atropine (LOMOTIL) 2.5-0.025 MG  tablet, Take 1 tablet by mouth 4 (four) times daily as needed for diarrhea or loose stools. (Patient not taking: Reported on 02/11/2021), Disp: 60 tablet, Rfl: 0 .  escitalopram (LEXAPRO) 20 MG tablet, Take 20 mg by mouth daily. , Disp: , Rfl:  .  esomeprazole (NEXIUM) 40 MG capsule, Take 40 mg by mouth daily before breakfast. , Disp: , Rfl:  .  Eszopiclone 3 MG TABS, Take 3 mg by mouth at bedtime as needed (sleep). , Disp: , Rfl:  .  folic acid (FOLVITE) 1 MG tablet, Take 1 mg by mouth daily., Disp: , Rfl:  .  ibuprofen (ADVIL) 800 MG tablet, Take 800 mg by mouth every 8 (eight) hours as needed for moderate pain. , Disp: , Rfl:  .  lidocaine-prilocaine (EMLA) cream, Apply to port and cover 1-2 hours prior to appointment, Disp: 30 g, Rfl: 3 .  loperamide (IMODIUM) 2 MG capsule, Take 2 tablets with onset of diarrhea, then take 1 tablet every 2 hours until diarrhea stops. Maximum 8 tablets in 24hours (Patient not taking: Reported on 02/11/2021), Disp: 30 capsule, Rfl: 3 .  loratadine (CLARITIN) 10 MG tablet, Take 10 mg by mouth daily. , Disp: , Rfl:  .  LORazepam (ATIVAN) 1 MG tablet, Take 1 mg by mouth 3 (three) times daily., Disp: , Rfl:  .  magic mouthwash SOLN, Take 5 mLs by mouth 4 (four) times daily. (Patient taking differently: Take 5 mLs by mouth 4 (four) times daily as needed.), Disp: 480 mL, Rfl: 1 .  Magnesium 400 MG TABS, Take 400 mg by mouth 2 (two) times daily., Disp: , Rfl:  .  meloxicam (MOBIC) 7.5 MG tablet, Take 7.5 mg by mouth 2 (two) times daily as needed (pain/inflammation.). , Disp: , Rfl:  .  methotrexate (RHEUMATREX) 2.5 MG tablet, Take 20 mg by mouth every Sunday. , Disp: , Rfl:  .  metoprolol succinate (TOPROL-XL) 25 MG 24 hr tablet, Take 37.5 mg by mouth every morning. , Disp: , Rfl:  .  nortriptyline (PAMELOR) 10 MG capsule, Take 30 mg by mouth at bedtime. , Disp: , Rfl:  .  oxyCODONE (OXY IR/ROXICODONE) 5 MG immediate release tablet, Take 1 tablet (5 mg total) by mouth every 6  (six) hours as needed for moderate pain or severe pain., Disp: 60 tablet, Rfl: 0 .  phentermine (ADIPEX-P) 37.5 MG tablet, Take 37.5 mg by mouth daily before breakfast. , Disp: , Rfl:  .  potassium chloride (KLOR-CON) 10 MEQ tablet, Take 1 tablet (10 mEq total) by mouth as directed. Take 2 tablet (20 mEq) daily for 7 days, then 1 tablet (10 mEq) daily until finished. (Patient not taking: Reported on 02/11/2021), Disp: 45 tablet, Rfl: 0 .  pregabalin (LYRICA) 150 MG capsule, Take  150 mg by mouth 2 (two) times daily., Disp: , Rfl:  .  promethazine (PHENERGAN) 25 MG tablet, Take 1 tablet (25 mg total) by mouth every 8 (eight) hours as needed for nausea or vomiting., Disp: 90 tablet, Rfl: 0 .  pyridOXINE (VITAMIN B-6) 100 MG tablet, Take 100 mg by mouth daily., Disp: , Rfl:  .  sucralfate (CARAFATE) 1 g tablet, Take 1 tablet (1 g total) by mouth 3 (three) times daily. Dissolve in 3-4 tbsp warm water, swish and swallow. (Patient not taking: Reported on 02/11/2021), Disp: 90 tablet, Rfl: 1 .  topiramate (TOPAMAX) 50 MG tablet, Take 50 mg by mouth 2 (two) times daily. , Disp: , Rfl:  .  vitamin C (ASCORBIC ACID) 500 MG tablet, Take 500 mg by mouth daily., Disp: , Rfl:  No current facility-administered medications for this visit.  Facility-Administered Medications Ordered in Other Visits:  .  heparin lock flush 100 unit/mL, 500 Units, Intravenous, Once, Earlie Server, MD .  sodium chloride flush (NS) 0.9 % injection 10 mL, 10 mL, Intravenous, PRN, Earlie Server, MD, 10 mL at 11/19/18 1249 .  sodium chloride flush (NS) 0.9 % injection 10 mL, 10 mL, Intravenous, Once, Earlie Server, MD   Physical exam:  Vitals:   03/04/21 0832  BP: 116/68  Pulse: 94  Temp: (!) 97.1 F (36.2 C)  TempSrc: Tympanic  Weight: 193 lb (87.5 kg)  ECOG 1 Physical Exam Constitutional:      General: She is not in acute distress.    Appearance: She is not diaphoretic.  HENT:     Head: Normocephalic and atraumatic.     Nose: Nose normal.      Mouth/Throat:     Pharynx: No oropharyngeal exudate.  Eyes:     General: No scleral icterus.       Left eye: No discharge.     Conjunctiva/sclera: Conjunctivae normal.     Pupils: Pupils are equal, round, and reactive to light.  Neck:     Vascular: No JVD.  Cardiovascular:     Rate and Rhythm: Regular rhythm. Tachycardia present.     Heart sounds: Normal heart sounds. No murmur heard.   Pulmonary:     Effort: Pulmonary effort is normal. No respiratory distress.     Breath sounds: Normal breath sounds. No wheezing or rales.  Chest:     Chest wall: No tenderness.  Abdominal:     General: Bowel sounds are normal. There is no distension.     Palpations: Abdomen is soft. There is no mass.     Tenderness: There is no abdominal tenderness. There is no rebound.  Musculoskeletal:        General: Normal range of motion.     Cervical back: Normal range of motion and neck supple.  Lymphadenopathy:     Cervical: No cervical adenopathy.  Skin:    General: Skin is warm and dry.     Findings: No erythema or rash.  Neurological:     Mental Status: She is alert and oriented to person, place, and time.     Cranial Nerves: No cranial nerve deficit.     Motor: No abnormal muscle tone.     Coordination: Coordination normal.  Psychiatric:        Mood and Affect: Affect normal.           CMP Latest Ref Rng & Units 02/18/2021  Glucose 70 - 99 mg/dL 117(H)  BUN 6 - 20 mg/dL 12  Creatinine 0.44 -  1.00 mg/dL 0.83  Sodium 135 - 145 mmol/L 139  Potassium 3.5 - 5.1 mmol/L 3.5  Chloride 98 - 111 mmol/L 110  CO2 22 - 32 mmol/L 22  Calcium 8.9 - 10.3 mg/dL 9.0  Total Protein 6.5 - 8.1 g/dL 7.1  Total Bilirubin 0.3 - 1.2 mg/dL 0.6  Alkaline Phos 38 - 126 U/L 74  AST 15 - 41 U/L 31  ALT 0 - 44 U/L 22   CBC Latest Ref Rng & Units 02/18/2021  WBC 4.0 - 10.5 K/uL 6.5  Hemoglobin 12.0 - 15.0 g/dL 11.0(L)  Hematocrit 36.0 - 46.0 % 33.4(L)  Platelets 150 - 400 K/uL 336   RADIOGRAPHIC  STUDIES: I have personally reviewed the radiological images as listed and agreed with the findings in the report. ECHOCARDIOGRAM LIMITED  Result Date: 01/30/2021    ECHOCARDIOGRAM LIMITED REPORT   Patient Name:   DARLIN STENSETH Date of Exam: 01/29/2021 Medical Rec #:  [id number]      Height:       67.0 in Accession #:    [id number]     Weight:       193.3 lb Date of Birth:  June 24, 1981      BSA:          1.993 m Patient Age:    63 years       BP:           112/70 mmHg Patient Gender: F              HR:           96 bpm. Exam Location:  [facility] Procedure: Limited Echo Indications:     Chemotherapy follow- up evaluation V 67.2  History:         Patient has prior history of Echocardiogram examinations, most                  recent 09/14/2020. Cardiomyopathy, COPD; Signs/Symptoms:Murmur.  Sonographer:     Sherrie Sport RDCS (AE) Referring Phys:  [id number] [place] Diagnosing Phys: Serafina Royals MD IMPRESSIONS  1. Left ventricular ejection fraction, by estimation, is 55 to 60%. The left ventricle has normal function. The left ventricle has no regional wall motion abnormalities. Left ventricular diastolic function could not be evaluated.  2. Right ventricular systolic function is normal. The right ventricular size is normal.  3. The mitral valve is normal in structure. Trivial mitral valve regurgitation.  4. The aortic valve is normal in structure. Aortic valve regurgitation is not visualized. FINDINGS  Left Ventricle: Left ventricular ejection fraction, by estimation, is 55 to 60%. The left ventricle has normal function. The left ventricle has no regional wall motion abnormalities. The left ventricular internal cavity size was normal in size. There is  no left ventricular hypertrophy. Left ventricular diastolic function could not be evaluated. Right Ventricle: The right ventricular size is normal. No increase in right ventricular wall thickness. Right ventricular systolic function is normal. Left Atrium: Left atrial size was  normal in size. Right Atrium: Right atrial size was normal in size. Pericardium: Trivial pericardial effusion is present. Mitral Valve: The mitral valve is normal in structure. Trivial mitral valve regurgitation. Tricuspid Valve: The tricuspid valve is normal in structure. Tricuspid valve regurgitation is trivial. Aortic Valve: The aortic valve is normal in structure. Aortic valve regurgitation is not visualized. Pulmonic Valve: The pulmonic valve was normal in structure. Pulmonic valve regurgitation is not visualized. Aorta: The aortic root and ascending aorta are structurally normal, with  no evidence of dilitation. IAS/Shunts: No atrial level shunt detected by color flow Doppler. LEFT VENTRICLE PLAX 2D LVIDd:         4.08 cm LVIDs:         2.85 cm LV PW:         0.83 cm LV IVS:        0.87 cm LVOT diam:     2.00 cm  3D Volume EF: LVOT Area:     3.14 cm 3D EF:        27 %                         LV EDV:       156 ml                         LV ESV:       113 ml                         LV SV:        42 ml LEFT ATRIUM             Index       RIGHT ATRIUM           Index LA diam:        2.80 cm 1.40 cm/m  RA Area:     11.80 cm LA Vol (A2C):   22.8 ml 11.44 ml/m RA Volume:   29.10 ml  14.60 ml/m LA Vol (A4C):   12.9 ml 6.47 ml/m LA Biplane Vol: 17.7 ml 8.88 ml/m                        PULMONIC VALVE AORTA                 PV Vmax:        0.65 m/s Ao Root diam: 2.90 cm PV Peak grad:   1.7 mmHg                       RVOT Peak grad: 3 mmHg  TRICUSPID VALVE TR Peak grad:   16.2 mmHg TR Vmax:        201.00 cm/s  SHUNTS Systemic Diam: 2.00 cm Serafina Royals MD Electronically signed by Serafina Royals MD Signature Date/Time: 01/30/2021/7:54:56 AM    Final         Assessment and plan- Patient is a 40 y.o. female presents for evaluation of newly diagnosed multicentric right breast cancer.  Cancer Staging Breast cancer of upper-outer quadrant of right female breast [facility]) Staging form: Breast, AJCC 8th Edition -  Clinical: G2, ER+, PR+, HER2- - Signed by Earlie Server, MD on 04/05/2018 Histologic grading system: 3 grade system - Pathologic stage from 04/04/2018: No Stage Recommended (ypT3, pN2, cM0, G3, ER+, PR-, HER2-) - Signed by Earlie Server, MD on 04/04/2018 Stage prefix: Post-therapy Neoadjuvant therapy: Yes Response to neoadjuvant therapy: No response Method of lymph node assessment: Axillary lymph node dissection Multigene prognostic tests performed: None Histologic grading system: 3 grade system Laterality: Right - Pathologic: Stage IV (rpTX, pNX, cM1, ER+, PR-, HER2+) - Signed by Earlie Server, MD on 12/10/2020 Stage prefix: Recurrence    1. Metastatic breast cancer ([place])   2. HER2-positive carcinoma of breast ([place])   3. Encounter for monoclonal antibody treatment for malignancy   4. Bone metastasis ([place])  5. Hypocalcemia   6. Neoplasm related pain   7. Neuropathy due to chemotherapeutic drug ([place])    Breast cancer with isolated bone metastasis, stage IV #Initially stage IIIA right breast cancer, ER positive, HER-2 negative status post bilateral oophorectomy, Right mastectomy and right axillary lymph node dissection, status post implant, and implant removal.  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 Estrogen positive, HER-2 positive breast cancer. Status post  radiation to T7  Labs are reviewed and discussed with patient.  Overall she tolerates trastuzumab and Pertuzumab maintenance.  Proceed with treatment today. Fulvestrant monthly. Obtain surveillance PET in April. #Tachycardia, chronic intermittent issue for her.  Heart rate is normal today. 01/29/21 2D echocardiogram LVEF 55%-60%. Monitor.   #Treatment induced diarrhea,Continue Lomotil as needed.  Symptoms are stable. #Bone metastasis, status post radiation. Continue Zometa monthly.  # Back pain, monitor symptoms.  continue oxycodone 5 mg daily 6 hours as  needed #Chemotherapy-induced neuropathy, on Lyrica and nortriptyline, continue.  #Depression.  Continue Lexapro 20 mg daily.  # Hypokalemia, potassium 3.5, can not tolerate oral potassium supplementation.    #Nasal ulceration, I sent a prescription of mupirocin nasal cream.  Advised patient to update me if no improvement. # Hypocalcemia, continue calcium 1566m and vitamin D supplementation. #Rheumatoid arthritis, patient needs lab work for rheumatologist.  We will add ESR to her next lab.   Follow-up in 3 weeks for the next cycle of trastuzumab and Pertuzumab treatments.  Patient knows to call clinic if she experiences concerning side effects  ZEarlie Server MD, PhD Hematology Oncology CLymanat [facility]03/24/22

## 2021-03-04 NOTE — Telephone Encounter (Signed)
Per Dr. Tasia Catchings: pt wants nasal ointment as she is going to apply intranasally.  [facility] back but unable to reach anyone. Left VM letting them know Mds response and to call back with any questions.

## 2021-03-04 NOTE — Telephone Encounter (Signed)
Cindy with [facility] called asking if Prescription sent for Bactroban can be changed to regular Bactroban instead of nasal Bactroban. She said the ingredients are the same

## 2021-03-10 ENCOUNTER — Other Ambulatory Visit: Payer: Medicare HMO

## 2021-03-10 ENCOUNTER — Ambulatory Visit: Payer: Medicare HMO

## 2021-03-12 ENCOUNTER — Other Ambulatory Visit: Payer: Medicare HMO

## 2021-03-12 ENCOUNTER — Ambulatory Visit: Payer: Medicare HMO | Admitting: Oncology

## 2021-03-12 ENCOUNTER — Ambulatory Visit: Payer: Medicare HMO

## 2021-03-15 ENCOUNTER — Other Ambulatory Visit: Payer: Self-pay

## 2021-03-15 ENCOUNTER — Ambulatory Visit
Admission: RE | Admit: 2021-03-15 | Discharge: 2021-03-15 | Disposition: A | Discharge status: Home / Self Care | Payer: Medicare HMO | Source: Ambulatory Visit | Attending: Oncology | Admitting: Oncology

## 2021-03-15 DIAGNOSIS — Z9013 Acquired absence of bilateral breasts and nipples: Secondary | ICD-10-CM | POA: Diagnosis not present

## 2021-03-15 DIAGNOSIS — C50919 Malignant neoplasm of unspecified site of unspecified female breast: Secondary | ICD-10-CM

## 2021-03-15 LAB — GLUCOSE, CAPILLARY: Glucose-Capillary: 108 mg/dL — ABNORMAL HIGH (ref 70–99)

## 2021-03-15 IMAGING — CT NM PET TUM IMG RESTAG (PS) SKULL BASE T - THIGH
1 of 10 series · 1 of 25 positions shown · non-contrast
Comparison: PET-CT dated [DATE]

CLINICAL DATA: Subsequent treatment strategy for metastatic breast
cancer.

EXAM:
NUCLEAR MEDICINE PET SKULL BASE TO THIGH
TECHNIQUE: 10.9 mCi F-18 FDG was injected intravenously. Full-ring PET imaging
was performed from the skull base to thigh after the radiotracer. CT
data was obtained and used for attenuation correction and anatomic
localization.
Fasting blood glucose: 108 mg/dl

[Series 3: ct wb 5.0 b30f · axial · 5.0mm · 0.98mm/px · 1 of 290 slices shown]
[im 290/290  brain]
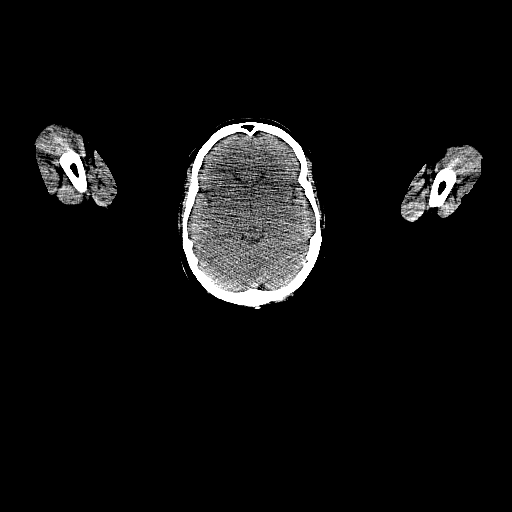

[1 of 25 positions shown; findings below may reference images not displayed]

FINDINGS: Mediastinal blood pool activity: SUV max

Liver activity: SUV max NA

NECK: No hypermetabolic cervical lymphadenopathy.

Incidental CT findings: none

CHEST: No suspicious pulmonary nodules.

Status post bilateral mastectomy.

No metabolic thoracic lymphadenopathy. Status post right axillary
lymph node dissection.

Right chest port terminates the cavoatrial junction.

Incidental CT findings: none

ABDOMEN/PELVIS: No abnormal hypermetabolism in the liver, spleen,
pancreas, or adrenal glands.

No hypermetabolic abdominopelvic lymphadenopathy.

Incidental CT findings: Status post cholecystectomy.

SKELETON: No focal hypermetabolic activity to suggest skeletal
metastasis.

Mild hypermetabolism along the right T7-8 paraspinal musculature,
max SUV 3.3, likely postprocedural.

Incidental CT findings: none
IMPRESSION: Status post bilateral mastectomy and right axillary lymph node
dissection.

Mild residual post procedure hypermetabolism involving the right
T7-8 paraspinal musculature.

No findings suspicious for recurrent or metastatic disease.

## 2021-03-15 MED ORDER — FLUDEOXYGLUCOSE F - 18 (FDG) INJECTION
10.0000 | Freq: Once | INTRAVENOUS | Status: AC | PRN
  Administered 2021-03-15: 10.85 via INTRAVENOUS

## 2021-03-18 ENCOUNTER — Inpatient Hospital Stay: Payer: Medicare HMO | Attending: Oncology

## 2021-03-18 ENCOUNTER — Inpatient Hospital Stay: Payer: Medicare HMO

## 2021-03-18 VITALS — BP 124/81 | HR 95 | Temp 97.4°F | Resp 20

## 2021-03-18 DIAGNOSIS — Z79899 Other long term (current) drug therapy: Secondary | ICD-10-CM | POA: Diagnosis not present

## 2021-03-18 DIAGNOSIS — Z170 Estrogen receptor positive status [ER+]: Secondary | ICD-10-CM

## 2021-03-18 DIAGNOSIS — C50411 Malignant neoplasm of upper-outer quadrant of right female breast: Secondary | ICD-10-CM | POA: Insufficient documentation

## 2021-03-18 DIAGNOSIS — Z5111 Encounter for antineoplastic chemotherapy: Secondary | ICD-10-CM | POA: Diagnosis not present

## 2021-03-18 DIAGNOSIS — C7951 Secondary malignant neoplasm of bone: Secondary | ICD-10-CM | POA: Insufficient documentation

## 2021-03-18 DIAGNOSIS — C50919 Malignant neoplasm of unspecified site of unspecified female breast: Secondary | ICD-10-CM

## 2021-03-18 DIAGNOSIS — Z5112 Encounter for antineoplastic immunotherapy: Secondary | ICD-10-CM | POA: Insufficient documentation

## 2021-03-18 LAB — CBC WITH DIFFERENTIAL/PLATELET
Abs Immature Granulocytes: 0.03 10*3/uL (ref 0.00–0.07)
Basophils Absolute: 0 10*3/uL (ref 0.0–0.1)
Basophils Relative: 1 %
Eosinophils Absolute: 0.1 10*3/uL (ref 0.0–0.5)
Eosinophils Relative: 2 %
HCT: 33.8 % — ABNORMAL LOW (ref 36.0–46.0)
Hemoglobin: 11.4 g/dL — ABNORMAL LOW (ref 12.0–15.0)
Immature Granulocytes: 0 %
Lymphocytes Relative: 26 %
Lymphs Abs: 1.8 10*3/uL (ref 0.7–4.0)
MCH: 30.4 pg (ref 26.0–34.0)
MCHC: 33.7 g/dL (ref 30.0–36.0)
MCV: 90.1 fL (ref 80.0–100.0)
Monocytes Absolute: 0.6 10*3/uL (ref 0.1–1.0)
Monocytes Relative: 9 %
Neutro Abs: 4.4 10*3/uL (ref 1.7–7.7)
Neutrophils Relative %: 62 %
Platelets: 298 10*3/uL (ref 150–400)
RBC: 3.75 MIL/uL — ABNORMAL LOW (ref 3.87–5.11)
RDW: 13.5 % (ref 11.5–15.5)
WBC: 7 10*3/uL (ref 4.0–10.5)
nRBC: 0 % (ref 0.0–0.2)

## 2021-03-18 LAB — BASIC METABOLIC PANEL
Anion gap: 10 (ref 5–15)
BUN: 15 mg/dL (ref 6–20)
CO2: 22 mmol/L (ref 22–32)
Calcium: 9.2 mg/dL (ref 8.9–10.3)
Chloride: 106 mmol/L (ref 98–111)
Creatinine, Ser: 0.76 mg/dL (ref 0.44–1.00)
GFR, Estimated: >60 mL/min (ref >60)
Glucose, Bld: 106 mg/dL — ABNORMAL HIGH (ref 70–99)
Potassium: 3.8 mmol/L (ref 3.5–5.1)
Sodium: 138 mmol/L (ref 135–145)

## 2021-03-18 LAB — SEDIMENTATION RATE: Sed Rate: 39 mm/hr — ABNORMAL HIGH (ref 0–20)

## 2021-03-18 MED ORDER — FULVESTRANT 250 MG/5ML IM SOLN
500.0000 mg | Freq: Once | INTRAMUSCULAR | Status: AC
Start: 2021-03-18 — End: 2021-03-18
  Administered 2021-03-18: 500 mg via INTRAMUSCULAR
  Filled 2021-03-18: qty 10

## 2021-03-18 MED ORDER — SODIUM CHLORIDE 0.9% FLUSH
10.0000 mL | INTRAVENOUS | Status: DC | PRN
  Administered 2021-03-18: 10 mL via INTRAVENOUS
  Filled 2021-03-18: qty 10

## 2021-03-18 MED ORDER — SODIUM CHLORIDE 0.9 % IV SOLN
INTRAVENOUS | Status: DC
  Filled 2021-03-18: qty 250

## 2021-03-18 MED ORDER — ZOLEDRONIC ACID 4 MG/5ML IV CONC
4.0000 mg | Freq: Once | INTRAVENOUS | Status: AC
  Administered 2021-03-18: 4 mg via INTRAVENOUS
  Filled 2021-03-18: qty 5

## 2021-03-18 MED ORDER — HEPARIN SOD (PORK) LOCK FLUSH 100 UNIT/ML IV SOLN
500.0000 [IU] | Freq: Once | INTRAVENOUS | Status: AC
  Administered 2021-03-18: 500 [IU] via INTRAVENOUS
  Filled 2021-03-18: qty 5

## 2021-03-18 MED ORDER — ZOLEDRONIC ACID 4 MG/100ML IV SOLN: 4.0000 mg | Freq: Once | INTRAVENOUS | Status: DC

## 2021-03-25 ENCOUNTER — Inpatient Hospital Stay (HOSPITAL_BASED_OUTPATIENT_CLINIC_OR_DEPARTMENT_OTHER): Payer: Medicare HMO | Admitting: Oncology

## 2021-03-25 ENCOUNTER — Inpatient Hospital Stay: Payer: Medicare HMO

## 2021-03-25 ENCOUNTER — Encounter: Payer: Self-pay | Admitting: Oncology

## 2021-03-25 VITALS — BP 105/73 | HR 101 | Temp 97.3°F | Resp 16 | Wt 198.4 lb

## 2021-03-25 VITALS — Resp 16

## 2021-03-25 DIAGNOSIS — Z5112 Encounter for antineoplastic immunotherapy: Secondary | ICD-10-CM

## 2021-03-25 DIAGNOSIS — Z170 Estrogen receptor positive status [ER+]: Secondary | ICD-10-CM

## 2021-03-25 DIAGNOSIS — C7951 Secondary malignant neoplasm of bone: Secondary | ICD-10-CM | POA: Insufficient documentation

## 2021-03-25 DIAGNOSIS — G620 Drug-induced polyneuropathy: Secondary | ICD-10-CM

## 2021-03-25 DIAGNOSIS — C50911 Malignant neoplasm of unspecified site of right female breast: Secondary | ICD-10-CM | POA: Diagnosis not present

## 2021-03-25 DIAGNOSIS — C50411 Malignant neoplasm of upper-outer quadrant of right female breast: Secondary | ICD-10-CM

## 2021-03-25 DIAGNOSIS — C50919 Malignant neoplasm of unspecified site of unspecified female breast: Secondary | ICD-10-CM

## 2021-03-25 DIAGNOSIS — T451X5A Adverse effect of antineoplastic and immunosuppressive drugs, initial encounter: Secondary | ICD-10-CM

## 2021-03-25 DIAGNOSIS — C50811 Malignant neoplasm of overlapping sites of right female breast: Secondary | ICD-10-CM

## 2021-03-25 DIAGNOSIS — Z95828 Presence of other vascular implants and grafts: Secondary | ICD-10-CM

## 2021-03-25 DIAGNOSIS — Z5111 Encounter for antineoplastic chemotherapy: Secondary | ICD-10-CM | POA: Diagnosis not present

## 2021-03-25 LAB — COMPREHENSIVE METABOLIC PANEL
ALT: 21 U/L (ref 0–44)
AST: 31 U/L (ref 15–41)
Albumin: 4.2 g/dL (ref 3.5–5.0)
Alkaline Phosphatase: 75 U/L (ref 38–126)
Anion gap: 9 (ref 5–15)
BUN: 15 mg/dL (ref 6–20)
CO2: 23 mmol/L (ref 22–32)
Calcium: 8.6 mg/dL — ABNORMAL LOW (ref 8.9–10.3)
Chloride: 107 mmol/L (ref 98–111)
Creatinine, Ser: 0.9 mg/dL (ref 0.44–1.00)
GFR, Estimated: >60 mL/min (ref >60)
Glucose, Bld: 115 mg/dL — ABNORMAL HIGH (ref 70–99)
Potassium: 3.6 mmol/L (ref 3.5–5.1)
Sodium: 139 mmol/L (ref 135–145)
Total Bilirubin: 0.3 mg/dL (ref 0.3–1.2)
Total Protein: 7.3 g/dL (ref 6.5–8.1)

## 2021-03-25 LAB — CBC WITH DIFFERENTIAL/PLATELET
Abs Immature Granulocytes: 0.02 10*3/uL (ref 0.00–0.07)
Basophils Absolute: 0 10*3/uL (ref 0.0–0.1)
Basophils Relative: 0 %
Eosinophils Absolute: 0.1 10*3/uL (ref 0.0–0.5)
Eosinophils Relative: 2 %
HCT: 31 % — ABNORMAL LOW (ref 36.0–46.0)
Hemoglobin: 10.4 g/dL — ABNORMAL LOW (ref 12.0–15.0)
Immature Granulocytes: 0 %
Lymphocytes Relative: 25 %
Lymphs Abs: 1.7 10*3/uL (ref 0.7–4.0)
MCH: 30.5 pg (ref 26.0–34.0)
MCHC: 33.5 g/dL (ref 30.0–36.0)
MCV: 90.9 fL (ref 80.0–100.0)
Monocytes Absolute: 0.7 10*3/uL (ref 0.1–1.0)
Monocytes Relative: 10 %
Neutro Abs: 4.3 10*3/uL (ref 1.7–7.7)
Neutrophils Relative %: 63 %
Platelets: 293 10*3/uL (ref 150–400)
RBC: 3.41 MIL/uL — ABNORMAL LOW (ref 3.87–5.11)
RDW: 13.6 % (ref 11.5–15.5)
WBC: 6.8 10*3/uL (ref 4.0–10.5)
nRBC: 0 % (ref 0.0–0.2)

## 2021-03-25 MED ORDER — TRASTUZUMAB-ANNS CHEMO 150 MG IV SOLR
6.0000 mg/kg | Freq: Once | INTRAVENOUS | Status: AC
  Administered 2021-03-25: 504 mg via INTRAVENOUS
  Filled 2021-03-25: qty 24

## 2021-03-25 MED ORDER — SODIUM CHLORIDE 0.9 % IV SOLN
420.0000 mg | Freq: Once | INTRAVENOUS | Status: AC
  Administered 2021-03-25: 420 mg via INTRAVENOUS
  Filled 2021-03-25: qty 14

## 2021-03-25 MED ORDER — HEPARIN SOD (PORK) LOCK FLUSH 100 UNIT/ML IV SOLN
INTRAVENOUS | Status: AC
  Filled 2021-03-25: qty 5

## 2021-03-25 MED ORDER — DIPHENHYDRAMINE HCL 25 MG PO CAPS
50.0000 mg | ORAL_CAPSULE | Freq: Once | ORAL | Status: AC
  Administered 2021-03-25: 50 mg via ORAL
  Filled 2021-03-25: qty 2

## 2021-03-25 MED ORDER — HEPARIN SOD (PORK) LOCK FLUSH 100 UNIT/ML IV SOLN
500.0000 [IU] | Freq: Once | INTRAVENOUS | Status: AC
Start: 2021-03-25 — End: 2021-03-25
  Administered 2021-03-25: 500 [IU] via INTRAVENOUS
  Filled 2021-03-25: qty 5

## 2021-03-25 MED ORDER — SODIUM CHLORIDE 0.9 % IV SOLN
Freq: Once | INTRAVENOUS | Status: AC
  Filled 2021-03-25: qty 250

## 2021-03-25 MED ORDER — SODIUM CHLORIDE 0.9% FLUSH
10.0000 mL | Freq: Once | INTRAVENOUS | Status: AC
  Administered 2021-03-25: 10 mL via INTRAVENOUS
  Filled 2021-03-25: qty 10

## 2021-03-25 MED ORDER — ACETAMINOPHEN 325 MG PO TABS
650.0000 mg | ORAL_TABLET | Freq: Once | ORAL | Status: AC
  Administered 2021-03-25: 650 mg via ORAL
  Filled 2021-03-25: qty 2

## 2021-03-25 NOTE — Progress Notes (Signed)
Hematology/Oncology Follow Up Note [place] [phone] [phone]  Patient Care Team: Marguerita Merles, MD as PCP - General (Family Medicine) End, Harrell Gave, MD as PCP - Cardiology (Cardiology) Rico Junker, RN as Oncology Nurse Navigator Earlie Server, MD as Consulting Physician (Oncology) Bary Castilla, Forest Gleason, MD (General Surgery) Noreene Filbert, MD as Referring Physician (Radiation Oncology) Noreene Filbert, MD as Radiation Oncologist (Radiation Oncology) Noreene Filbert, MD as Radiation Oncologist (Radiation Oncology)   Name of the patient: Theresa Chaney  [id number]  1981/05/03   Date of visit: 03/25/21 REASON FOR VISIT Follow up for Assessment prior to chemotherapy treatment of breast cancer  Oncology History Cancer TREATMENT Neoadjuvant ddAC +one dose of Taxol, due to lack of response, surgery was offered. Case was discussed on breast tumor board. 03/19/2018 S/p right mastectomy and right axillary dissection, immediate breast reconstruction with placement of expanders.  ypT3 ypN2, + lymphovascular invasion,  Grade 3, margin is negative, close. ER 90%, PR 0%, HER2 IHC negative.  Also had elective bilateral salpingo-oophorectomy..  # 06/11/2018 s/p 11 cycles Taxol adjuvantly. Tolerated well.  # She has obtained dental clearance for starting Zometa.  S/p Zometa on 6/3/ 2019 # s/p adjuvant right chest wall radiation, finished 10/10/2018 #06/03/2019 underwent elective left prophylactic mastectomy and sentinel lymph node biopsy of left axilla.  # 06/03/2019 underwent elective left prophylactic mastectomy and sentinel lymph node biopsy of left axilla. Pathology negative for malignancy. Status post Mediport removal on 06/03/2019. She also underwent right implant removal on 06/03/2019. She also underwent right implant removal on 06/03/2019. #Negative genetic testing  #  chemotherapy-induced neuropathy, bilateral fingertips and lower extremities.   Patient is on Lyrica and nortriptyline.  Follows up with neurology. She follows up with lymphedema clinic.  #  07/07/2020, MRI thoracic spine without contrast showed lesions involving the T7 posterior elements most concerning for metastatic lesion.  No evidence of epidural tumor.  Minimal thoracic spondylosis without stenosis. MRI was reviewed by me and a PET scan was obtained for further evaluation. 07/20/2020, PET scan showed hypermetabolic metastasis involving the posterior element of T7, no additional evidence of metastasis in the neck, chest, abdomen or pelvis.  # 07/29/2020 T7 lesion biopsy showed metastatic carcinoma, compatible with breast origin.  Receptor status staining showed ER 71-80% positive, PR negative, HER-2 positive IHC 3+ # Patient finished spine radiation on 08/31/2020   positive for COVID-19 on 01/11/2021  INTERVAL HISTORY 40 yo female with above oncology history reviewed by me presents for follow-up of management of metastatic breast cancer.  Intermittent chronic back pain, sometimes triggered by certain position, i.e. prolonged standing position. No nausea vomiting diarrhea. She has had  bilateral flank muscle cramps and reported to RN when she was here for fulvestrant and Zometa.    Review of Systems  Constitutional: Negative for chills, fever, malaise/fatigue and weight loss.  HENT: Negative for sore throat.   Eyes: Negative for redness.  Respiratory: Negative for cough, shortness of breath and wheezing.   Cardiovascular: Negative for chest pain, palpitations and leg swelling.  Gastrointestinal: Negative for abdominal pain, blood in stool, heartburn, nausea and vomiting.  Genitourinary: Negative for dysuria.  Musculoskeletal: Positive for back pain and joint pain. Negative for myalgias.  Skin: Negative for rash.  Neurological: Positive for tingling. Negative for dizziness and tremors.  Endo/Heme/Allergies: Does not bruise/bleed easily.  Psychiatric/Behavioral:  Negative for hallucinations.    No Known Allergies  Patient Active Problem List   Diagnosis Date Noted  . Bone metastasis (  [place]) 03/25/2021  . Encounter for monoclonal antibody treatment for malignancy 12/10/2020  . Bone lesion 11/19/2020  . Hypocalcemia 11/19/2020  . Inflammatory arthritis 11/19/2020  . Encounter for antineoplastic chemotherapy 10/29/2020  . Metastatic breast cancer ([place]) 08/31/2020  . Goals of care, counseling/discussion 08/11/2020  . Neuropathy due to chemotherapeutic drug ([place]) 08/11/2020  . HER2-positive carcinoma of breast ([employer]) 08/11/2020  . 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 Bilateral hand swelling 10/03/2019  . Fracture of neck of metacarpal bone 05/14/2019  . Chronic fatigue 04/09/2019  . Polyarthralgia 04/09/2019  . Status post right breast reconstruction 02/26/2019  . Status post right mastectomy 02/26/2019  . Mastalgia 02/15/2019  . Shortness of breath 08/23/2018  . Nonischemic cardiomyopathy ([place]) 08/23/2018  . Preprocedural cardiovascular examination 08/23/2018  . Tachycardia 08/23/2018  . Palpitations 08/23/2018  . Estrogen receptor positive status (ER+) 04/04/2018  . Acquired absence of right breast and nipple 04/03/2018  . Breast cancer of upper-outer quadrant of right female breast ([place]) 03/19/2018  . Family history of cancer   . Malignant neoplasm of overlapping sites of right breast in female, estrogen receptor positive ([place]) 10/19/2017  . Gastroesophageal reflux disease without esophagitis 02/24/2017  . Generalized anxiety disorder 10/03/2014  . Headache 10/03/2014     Past Medical History:  Diagnosis Date  . Anemia   . Arthritis   . BRCA negative 11/26/2017  . Breast cancer ([place]) 10/11/2017   Multifocal, ER positive, PR negative, HER-2 negative. ypT3 ypN2a 8.7 cm, 4/15 nodes  . Cardiomyopathy ([place])    a. 10/2017 Echo: EF 60-65%, no rwma, Gr1 DD, nl RV size/fxn; b. 04/2018 Echo: EF 55-60%, no rwma, Nl RV size/fxn; c. 08/2018  Echo: EF 45%, diff HK, ? HK of antsept wall. Gr1 DD. Mild MR. Mild LAE/RAE. Mod dil RV.   [name] Chronic bronchitis ([place]) 11/2017  . COPD (chronic obstructive pulmonary disease) ([place])    MILD PER CXR  . Depression   . Family history of cancer   . GERD (gastroesophageal reflux disease)   . Headache    MIGRAINES  . Heart murmur    ASYMPTOMATIC  . Personal history of chemotherapy    current for right breast ca     Past Surgical History:  Procedure Laterality Date  . AXILLARY LYMPH NODE DISSECTION Right 03/19/2018   Procedure: AXILLARY LYMPH NODE DISSECTION;  Surgeon: Robert Bellow, MD;  Location: [facility] OR[facility];  Service: General;  Laterality: Right;  . BREAST BIOPSY Right 10/11/2017   12:30 posterior coil clip invasive mammary carcinoma  . BREAST BIOPSY Right 10/11/2017   11:30 middle depth ribbon clip DCIS  . BREAST BIOPSY Right 10/11/2017   5:30 anterior depth x shape invasive ductal carcinoma  . BREAST IMPLANT REMOVAL Right 06/03/2019   Procedure: REMOVAL OF RIGHT BREAST IMPLANTS;  Surgeon: Wallace Going, DO;  Location: [facility];  Service: Plastics;  Laterality: Right;  . BREAST RECONSTRUCTION WITH PLACEMENT OF TISSUE EXPANDER AND FLEX HD (ACELLULAR HYDRATED DERMIS) Right 03/19/2018   Procedure: BREAST RECONSTRUCTION WITH PLACEMENT OF TISSUE EXPANDER AND FLEX HD (ACELLULAR HYDRATED DERMIS);  Surgeon: Wallace Going, DO;  Location: [facility];  Service: Plastics;  Laterality: Right;  . CARPAL TUNNEL RELEASE Bilateral 2020  . CHOLECYSTECTOMY N/A 04/27/2020   Procedure: LAPAROSCOPIC CHOLECYSTECTOMY WITH INTRAOPERATIVE CHOLANGIOGRAM;  Surgeon: Robert Bellow, MD;  Location: [facility];  Service: General;  Laterality: N/A;  . ESOPHAGOGASTRODUODENOSCOPY (EGD) WITH PROPOFOL N/A 04/17/2020   Procedure: ESOPHAGOGASTRODUODENOSCOPY (EGD) WITH PROPOFOL;  Surgeon: Robert Bellow, MD;  Location: [facility];  Service: Endoscopy;  Laterality: N/A;  with biopsy  . LAPAROSCOPIC BILATERAL  SALPINGO OOPHERECTOMY Bilateral 03/19/2018   Procedure: LAPAROSCOPIC BILATERAL SALPINGO OOPHORECTOMY;  Surgeon: Benjaman Kindler, MD;  Location: [facility];  Service: Gynecology;  Laterality: Bilateral;  . MASTECTOMY Right 03/2018  . MASTECTOMY W/ SENTINEL NODE BIOPSY Right 03/19/2018   Procedure: MASTECTOMY WITH SENTINEL LYMPH NODE BIOPSY;  Surgeon: Robert Bellow, MD;  Location: [facility];  Service: General;  Laterality: Right;  . PORT-A-CATH REMOVAL Left 06/03/2019   Procedure: REMOVAL PORT-A-CATH;  Surgeon: Robert Bellow, MD;  Location: [facility];  Service: General;  Laterality: Left;  . PORTACATH PLACEMENT Left 10/24/2017   Procedure: INSERTION PORT-A-CATH;  Surgeon: Robert Bellow, MD;  Location: [facility];  Service: General;  Laterality: Left;  . PORTACATH PLACEMENT Right 09/28/2020   Procedure: INSERTION PORT-A-CATH;  Surgeon: Robert Bellow, MD;  Location: [facility];  Service: General;  Laterality: Right;  . REMOVAL OF TISSUE EXPANDER AND PLACEMENT OF IMPLANT Right 07/20/2018   Procedure: REMOVAL OF RIGHT BREAST TISSUE EXPANDER AND PLACEMENT OF IMPLANT;  Surgeon: Wallace Going, DO;  Location: [place] [place];  Service: Plastics;  Laterality: Right;  . SIMPLE MASTECTOMY WITH AXILLARY SENTINEL NODE BIOPSY Left 06/03/2019   Procedure: SIMPLE MASTECTOMY LEFT;  Surgeon: Robert Bellow, MD;  Location: [facility];  Service: General;  Laterality: Left;    Social History   Socioeconomic History  . Marital status: Married    Spouse name: Not on file  . Number of children: Not on file  . Years of education: Not on file  . Highest education level: Not on file  Occupational History  . Occupation: Occupational psychologist    Comment: Therapist, art   Tobacco Use  . Smoking status: Current Every Day Smoker    Packs/day: 0.50    Years: 18.00    Pack years: 9.00    Types: Cigarettes    Start date: 06/21/2018  . Smokeless tobacco: Never Used  Vaping Use   . Vaping Use: Never used  Substance and Sexual Activity  . Alcohol use: No  . Drug use: No  . Sexual activity: Yes    Birth control/protection: Injection, Other-see comments    Comment: has had hysterectomy  Other Topics Concern  . Not on file  Social History Narrative   Lives at home with husband and daughter   Social Determinants of Health   Financial Resource Strain: Not on file  Food Insecurity: Not on file  Transportation Needs: Not on file  Physical Activity: Not on file  Stress: Not on file  Social Connections: Not on file  Intimate Partner Violence: Not on file     Family History  Problem Relation Age of Onset  . Melanoma Maternal Aunt        other aunts with BCC/SCC/Melanoma  . Diabetes Father   . Hypertension Father   . Hyperlipidemia Father   . Heart attack Father 67       "mild"  . Bladder Cancer Maternal Grandmother   . Cervical cancer Maternal Aunt 64       daughter w/ cervical cancer as well  . Melanoma Maternal Uncle        other uncles with BCC/SCC/Melanoma     Current Outpatient Medications:  .  acetaminophen (TYLENOL) 500 MG tablet, Take 500 mg by mouth every 6 (six) hours as needed., Disp: , Rfl:  .  albuterol (VENTOLIN HFA) 108 (90 Base)  MCG/ACT inhaler, Inhale 2 puffs into the lungs every 6 (six) hours as needed for wheezing or shortness of breath. , Disp: , Rfl:  .  Calcium Carbonate-Vitamin D3 (CALCIUM 600/VITAMIN D) 600-400 MG-UNIT TABS, Take 2 tablets by mouth daily. (Patient taking differently: Take 3 tablets by mouth daily.), Disp: 180 tablet, Rfl: 0 .  cyanocobalamin (,VITAMIN B-12,) 1000 MCG/ML injection, Inject 1,000 mcg into the skin every 30 (thirty) days., Disp: , Rfl:  .  cyclobenzaprine (FLEXERIL) 5 MG tablet, Take 5 mg by mouth 3 (three) times daily as needed for muscle spasms. , Disp: , Rfl:  .  escitalopram (LEXAPRO) 20 MG tablet, Take 20 mg by mouth daily. , Disp: , Rfl:  .  esomeprazole (NEXIUM) 40 MG capsule, Take 40 mg by  mouth daily before breakfast. , Disp: , Rfl:  .  Eszopiclone 3 MG TABS, Take 3 mg by mouth at bedtime as needed (sleep). , Disp: , Rfl:  .  folic acid (FOLVITE) 1 MG tablet, Take 1 mg by mouth daily., Disp: , Rfl:  .  lidocaine-prilocaine (EMLA) cream, Apply to port and cover 1-2 hours prior to appointment, Disp: 30 g, Rfl: 3 .  loratadine (CLARITIN) 10 MG tablet, Take 10 mg by mouth daily. , Disp: , Rfl:  .  LORazepam (ATIVAN) 1 MG tablet, Take 1 mg by mouth 3 (three) times daily., Disp: , Rfl:  .  Magnesium 400 MG TABS, Take 400 mg by mouth 2 (two) times daily., Disp: , Rfl:  .  meloxicam (MOBIC) 7.5 MG tablet, Take 7.5 mg by mouth 2 (two) times daily as needed (pain/inflammation.). , Disp: , Rfl:  .  methotrexate (RHEUMATREX) 2.5 MG tablet, Take 20 mg by mouth every Sunday. , Disp: , Rfl:  .  metoprolol succinate (TOPROL-XL) 25 MG 24 hr tablet, Take 37.5 mg by mouth every morning. , Disp: , Rfl:  .  mupirocin nasal ointment (BACTROBAN) 2 %, Place 1 application into the nose 2 (two) times daily. Use one-half of tube in each nostril twice daily for five (5) days. After application, press sides of nose together and gently massage., Disp: 10 g, Rfl: 0 .  nortriptyline (PAMELOR) 10 MG capsule, Take 30 mg by mouth at bedtime. , Disp: , Rfl:  .  oxyCODONE (OXY IR/ROXICODONE) 5 MG immediate release tablet, Take 1 tablet (5 mg total) by mouth every 6 (six) hours as needed for moderate pain or severe pain., Disp: 60 tablet, Rfl: 0 .  phentermine (ADIPEX-P) 37.5 MG tablet, Take 37.5 mg by mouth daily before breakfast. , Disp: , Rfl:  .  pregabalin (LYRICA) 150 MG capsule, Take 150 mg by mouth 2 (two) times daily., Disp: , Rfl:  .  promethazine (PHENERGAN) 25 MG tablet, Take 1 tablet (25 mg total) by mouth every 8 (eight) hours as needed for nausea or vomiting., Disp: 90 tablet, Rfl: 0 .  pyridOXINE (VITAMIN B-6) 100 MG tablet, Take 100 mg by mouth daily., Disp: , Rfl:  .  topiramate (TOPAMAX) 50 MG tablet,  Take 50 mg by mouth 2 (two) times daily. , Disp: , Rfl:  .  vitamin C (ASCORBIC ACID) 500 MG tablet, Take 500 mg by mouth daily., Disp: , Rfl:  .  cholecalciferol (VITAMIN D3) 25 MCG (1000 UNIT) tablet, Take 1 tablet (1,000 Units total) by mouth daily., Disp: 90 tablet, Rfl: 0 .  diphenoxylate-atropine (LOMOTIL) 2.5-0.025 MG tablet, Take 1 tablet by mouth 4 (four) times daily as needed for diarrhea or loose stools. (  Patient not taking: No sig reported), Disp: 60 tablet, Rfl: 0 .  ibuprofen (ADVIL) 800 MG tablet, Take 800 mg by mouth every 8 (eight) hours as needed for moderate pain.  (Patient not taking: Reported on 03/25/2021), Disp: , Rfl:  .  loperamide (IMODIUM) 2 MG capsule, Take 2 tablets with onset of diarrhea, then take 1 tablet every 2 hours until diarrhea stops. Maximum 8 tablets in 24hours (Patient not taking: No sig reported), Disp: 30 capsule, Rfl: 3 No current facility-administered medications for this visit.  Facility-Administered Medications Ordered in Other Visits:  .  heparin lock flush 100 unit/mL, 500 Units, Intravenous, Once, Earlie Server, MD .  heparin lock flush 100 unit/mL, 500 Units, Intravenous, Once, Earlie Server, MD .  sodium chloride flush (NS) 0.9 % injection 10 mL, 10 mL, Intravenous, PRN, Earlie Server, MD, 10 mL at 11/19/18 1249 .  sodium chloride flush (NS) 0.9 % injection 10 mL, 10 mL, Intravenous, Once, Earlie Server, MD   Physical exam:  Vitals:   03/25/21 0815  BP: 105/73  Pulse: (!) 101  Resp: 16  Temp: (!) 97.3 F (36.3 C)  SpO2: 98%  Weight: 198 lb 6.4 oz (90 kg)  ECOG 1 Physical Exam Constitutional:      General: She is not in acute distress.    Appearance: She is not diaphoretic.  HENT:     Head: Normocephalic and atraumatic.     Nose: Nose normal.     Mouth/Throat:     Pharynx: No oropharyngeal exudate.  Eyes:     General: No scleral icterus.       Left eye: No discharge.     Conjunctiva/sclera: Conjunctivae normal.     Pupils: Pupils are equal, round,  and reactive to light.  Neck:     Vascular: No JVD.  Cardiovascular:     Rate and Rhythm: Regular rhythm. Tachycardia present.     Heart sounds: Normal heart sounds. No murmur heard.   Pulmonary:     Effort: Pulmonary effort is normal. No respiratory distress.     Breath sounds: Normal breath sounds. No wheezing or rales.  Chest:     Chest wall: No tenderness.  Abdominal:     General: Bowel sounds are normal. There is no distension.     Palpations: Abdomen is soft. There is no mass.     Tenderness: There is no abdominal tenderness. There is no rebound.  Musculoskeletal:        General: Normal range of motion.     Cervical back: Normal range of motion and neck supple.  Lymphadenopathy:     Cervical: No cervical adenopathy.  Skin:    General: Skin is warm and dry.     Findings: No erythema or rash.  Neurological:     Mental Status: She is alert and oriented to person, place, and time.     Cranial Nerves: No cranial nerve deficit.     Motor: No abnormal muscle tone.     Coordination: Coordination normal.  Psychiatric:        Mood and Affect: Affect normal.           CMP Latest Ref Rng & Units 03/25/2021  Glucose 70 - 99 mg/dL 115(H)  BUN 6 - 20 mg/dL 15  Creatinine 0.44 - 1.00 mg/dL 0.90  Sodium 135 - 145 mmol/L 139  Potassium 3.5 - 5.1 mmol/L 3.6  Chloride 98 - 111 mmol/L 107  CO2 22 - 32 mmol/L 23  Calcium 8.9 -  10.3 mg/dL 8.6(L)  Total Protein 6.5 - 8.1 g/dL 7.3  Total Bilirubin 0.3 - 1.2 mg/dL 0.3  Alkaline Phos 38 - 126 U/L 75  AST 15 - 41 U/L 31  ALT 0 - 44 U/L 21   CBC Latest Ref Rng & Units 03/25/2021  WBC 4.0 - 10.5 K/uL 6.8  Hemoglobin 12.0 - 15.0 g/dL 10.4(L)  Hematocrit 36.0 - 46.0 % 31.0(L)  Platelets 150 - 400 K/uL 293   RADIOGRAPHIC STUDIES: I have personally reviewed the radiological images as listed and agreed with the findings in the report. NM PET Image Restag (PS) Skull Base To Thigh  Result Date: 03/16/2021 CLINICAL DATA:  Subsequent  treatment strategy for metastatic breast cancer. EXAM: NUCLEAR MEDICINE PET SKULL BASE TO THIGH TECHNIQUE: 10.9 mCi F-18 FDG was injected intravenously. Full-ring PET imaging was performed from the skull base to thigh after the radiotracer. CT data was obtained and used for attenuation correction and anatomic localization. Fasting blood glucose: 108 mg/dl COMPARISON:  PET-CT dated 12/03/2020 FINDINGS: Mediastinal blood pool activity: SUV max 2.0 Liver activity: SUV max NA NECK: No hypermetabolic cervical lymphadenopathy. Incidental CT findings: none CHEST: No suspicious pulmonary nodules. Status post bilateral mastectomy. No metabolic thoracic lymphadenopathy. Status post right axillary lymph node dissection. Right chest port terminates the cavoatrial junction. Incidental CT findings: none ABDOMEN/PELVIS: No abnormal hypermetabolism in the liver, spleen, pancreas, or adrenal glands. No hypermetabolic abdominopelvic lymphadenopathy. Incidental CT findings: Status post cholecystectomy. SKELETON: No focal hypermetabolic activity to suggest skeletal metastasis. Mild hypermetabolism along the right T7-8 paraspinal musculature, max SUV 3.3, likely postprocedural. Incidental CT findings: none IMPRESSION: Status post bilateral mastectomy and right axillary lymph node dissection. Mild residual post procedure hypermetabolism involving the right T7-8 paraspinal musculature. No findings suspicious for recurrent or metastatic disease. Electronically Signed   By: Julian Hy M.D.   On: 03/16/2021 09:11   ECHOCARDIOGRAM LIMITED  Result Date: 01/30/2021    ECHOCARDIOGRAM LIMITED REPORT   Patient Name:   Theresa Chaney Date of Exam: 01/29/2021 Medical Rec #:  [id number]      Height:       67.0 in Accession #:    [id number]     Weight:       193.3 lb Date of Birth:  October 03, 1981      BSA:          1.993 m Patient Age:    55 years       BP:           112/70 mmHg Patient Gender: F              HR:           96 bpm. Exam Location:   [facility] Procedure: Limited Echo Indications:     Chemotherapy follow- up evaluation V 67.2  History:         Patient has prior history of Echocardiogram examinations, most                  recent 09/14/2020. Cardiomyopathy, COPD; Signs/Symptoms:Murmur.  Sonographer:     Sherrie Sport RDCS (AE) Referring Phys:  [id number] [place] Diagnosing Phys: Serafina Royals MD IMPRESSIONS  1. Left ventricular ejection fraction, by estimation, is 55 to 60%. The left ventricle has normal function. The left ventricle has no regional wall motion abnormalities. Left ventricular diastolic function could not be evaluated.  2. Right ventricular systolic function is normal. The right ventricular size is normal.  3. The mitral valve is normal in  structure. Trivial mitral valve regurgitation.  4. The aortic valve is normal in structure. Aortic valve regurgitation is not visualized. FINDINGS  Left Ventricle: Left ventricular ejection fraction, by estimation, is 55 to 60%. The left ventricle has normal function. The left ventricle has no regional wall motion abnormalities. The left ventricular internal cavity size was normal in size. There is  no left ventricular hypertrophy. Left ventricular diastolic function could not be evaluated. Right Ventricle: The right ventricular size is normal. No increase in right ventricular wall thickness. Right ventricular systolic function is normal. Left Atrium: Left atrial size was normal in size. Right Atrium: Right atrial size was normal in size. Pericardium: Trivial pericardial effusion is present. Mitral Valve: The mitral valve is normal in structure. Trivial mitral valve regurgitation. Tricuspid Valve: The tricuspid valve is normal in structure. Tricuspid valve regurgitation is trivial. Aortic Valve: The aortic valve is normal in structure. Aortic valve regurgitation is not visualized. Pulmonic Valve: The pulmonic valve was normal in structure. Pulmonic valve regurgitation is not visualized. Aorta: The aortic  root and ascending aorta are structurally normal, with no evidence of dilitation. IAS/Shunts: No atrial level shunt detected by color flow Doppler. LEFT VENTRICLE PLAX 2D LVIDd:         4.08 cm LVIDs:         2.85 cm LV PW:         0.83 cm LV IVS:        0.87 cm LVOT diam:     2.00 cm  3D Volume EF: LVOT Area:     3.14 cm 3D EF:        27 %                         LV EDV:       156 ml                         LV ESV:       113 ml                         LV SV:        42 ml LEFT ATRIUM             Index       RIGHT ATRIUM           Index LA diam:        2.80 cm 1.40 cm/m  RA Area:     11.80 cm LA Vol (A2C):   22.8 ml 11.44 ml/m RA Volume:   29.10 ml  14.60 ml/m LA Vol (A4C):   12.9 ml 6.47 ml/m LA Biplane Vol: 17.7 ml 8.88 ml/m                        PULMONIC VALVE AORTA                 PV Vmax:        0.65 m/s Ao Root diam: 2.90 cm PV Peak grad:   1.7 mmHg                       RVOT Peak grad: 3 mmHg  TRICUSPID VALVE TR Peak grad:   16.2 mmHg TR Vmax:        201.00 cm/s  SHUNTS Systemic Diam: 2.00 cm Serafina Royals MD Electronically signed by  Serafina Royals MD Signature Date/Time: 01/30/2021/7:54:56 AM    Final         Assessment and plan- Patient is a 40 y.o. female presents for evaluation of newly diagnosed multicentric right breast cancer.  Cancer Staging Breast cancer of upper-outer quadrant of right female breast [facility]) Staging form: Breast, AJCC 8th Edition - Clinical: G2, ER+, PR+, HER2- - Signed by Earlie Server, MD on 04/05/2018 Histologic grading system: 3 grade system - Pathologic stage from 04/04/2018: No Stage Recommended (ypT3, pN2, cM0, G3, ER+, PR-, HER2-) - Signed by Earlie Server, MD on 04/04/2018 Stage prefix: Post-therapy Neoadjuvant therapy: Yes Response to neoadjuvant therapy: No response Method of lymph node assessment: Axillary lymph node dissection Multigene prognostic tests performed: None Histologic grading system: 3 grade system Laterality: Right - Pathologic: Stage IV (rpTX,  pNX, cM1, ER+, PR-, HER2+) - Signed by Earlie Server, MD on 12/10/2020 Stage prefix: Recurrence    1. HER2-positive carcinoma of breast ([place])   2. Encounter for monoclonal antibody treatment for malignancy   3. Metastatic breast cancer ([place])   4. Bone metastasis ([place])   5. Hypocalcemia   6. Neuropathy due to chemotherapeutic drug ([place])    Breast cancer with isolated bone metastasis, stage IV #Initially stage IIIA right breast cancer, ER positive, HER-2 negative status post bilateral oophorectomy, Right mastectomy and right axillary lymph node dissection, status post implant, and implant removal.  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 Estrogen positive, HER-2 positive breast cancer. Status post  radiation to T7  03/15/2021, PET scan showed no focal hypermetabolic activity to suggest skeletal metastasis.  Mild hypermetabolic activity along the right T7-8 paraspinal musculature.  Max SUV 3.3.  Likely postprocedural-SUV was not quantified in her December PET scan.  Will reach out to Dr. Delsa Grana are reviewed and discussed with patient. Proceed with trastuzumab and Pertuzumab today. Fulvestrant monthly.   #Tachycardia, chronic intermittent issue for her.  Heart rate is normal today. 01/29/21 2D echocardiogram LVEF 55%-60%. Monitor.   #Treatment induced diarrhea,Continue Lomotil as needed.  Symptoms are stable. #Bone metastasis, status post radiation. Continue Zometa monthly.  # Back pain, monitor symptoms.  continue oxycodone 5 mg daily 6 hours as needed #Muscle cramps likely secondary to hypocalcemia.  Recommend patient to continue calcium 1500 mg daily and vitamin D supplementation. #Chemotherapy-induced neuropathy, on Lyrica and nortriptyline, continue.  #Depression.  Continue Lexapro 20 mg daily. # Hypokalemia, potassium 3.6, off oral potassium supplementation due to not able to tolerate.  Potassium  level stable. #Rheumatoid arthritis,   ESR was added per patient's request.  Results were discussed with patient.  She will further discuss with rheumatology.   Follow-up in 3 weeks for the next cycle of trastuzumab and Pertuzumab treatments.  Patient knows to call clinic if she experiences concerning side effects  Earlie Server, MD, PhD Hematology Oncology [place] at [facility] 03/25/21

## 2021-04-07 ENCOUNTER — Other Ambulatory Visit: Payer: Medicare HMO

## 2021-04-07 ENCOUNTER — Ambulatory Visit: Payer: Medicare HMO

## 2021-04-12 ENCOUNTER — Other Ambulatory Visit: Payer: Medicare HMO

## 2021-04-12 ENCOUNTER — Ambulatory Visit: Payer: Medicare HMO

## 2021-04-12 ENCOUNTER — Ambulatory Visit: Payer: Medicare HMO | Admitting: Oncology

## 2021-04-15 ENCOUNTER — Inpatient Hospital Stay (HOSPITAL_BASED_OUTPATIENT_CLINIC_OR_DEPARTMENT_OTHER): Payer: Medicare HMO | Admitting: Oncology

## 2021-04-15 ENCOUNTER — Other Ambulatory Visit: Payer: Self-pay

## 2021-04-15 ENCOUNTER — Other Ambulatory Visit: Payer: Medicare HMO

## 2021-04-15 ENCOUNTER — Encounter: Payer: Self-pay | Admitting: Oncology

## 2021-04-15 ENCOUNTER — Ambulatory Visit: Payer: Medicare HMO

## 2021-04-15 ENCOUNTER — Inpatient Hospital Stay: Payer: Medicare HMO | Attending: Oncology

## 2021-04-15 ENCOUNTER — Inpatient Hospital Stay: Payer: Medicare HMO

## 2021-04-15 VITALS — BP 116/72 | HR 102 | Temp 97.1°F | Resp 18 | Wt 192.1 lb

## 2021-04-15 DIAGNOSIS — Z5112 Encounter for antineoplastic immunotherapy: Secondary | ICD-10-CM

## 2021-04-15 DIAGNOSIS — C50919 Malignant neoplasm of unspecified site of unspecified female breast: Secondary | ICD-10-CM | POA: Diagnosis not present

## 2021-04-15 DIAGNOSIS — C50811 Malignant neoplasm of overlapping sites of right female breast: Secondary | ICD-10-CM

## 2021-04-15 DIAGNOSIS — Z79899 Other long term (current) drug therapy: Secondary | ICD-10-CM | POA: Insufficient documentation

## 2021-04-15 DIAGNOSIS — G620 Drug-induced polyneuropathy: Secondary | ICD-10-CM

## 2021-04-15 DIAGNOSIS — Z5111 Encounter for antineoplastic chemotherapy: Secondary | ICD-10-CM | POA: Insufficient documentation

## 2021-04-15 DIAGNOSIS — C7951 Secondary malignant neoplasm of bone: Secondary | ICD-10-CM | POA: Diagnosis not present

## 2021-04-15 DIAGNOSIS — Z170 Estrogen receptor positive status [ER+]: Secondary | ICD-10-CM

## 2021-04-15 DIAGNOSIS — T451X5A Adverse effect of antineoplastic and immunosuppressive drugs, initial encounter: Secondary | ICD-10-CM

## 2021-04-15 DIAGNOSIS — C50411 Malignant neoplasm of upper-outer quadrant of right female breast: Secondary | ICD-10-CM | POA: Diagnosis present

## 2021-04-15 DIAGNOSIS — R000 Tachycardia, unspecified: Secondary | ICD-10-CM

## 2021-04-15 LAB — COMPREHENSIVE METABOLIC PANEL
ALT: 23 U/L (ref 0–44)
AST: 30 U/L (ref 15–41)
Albumin: 4.4 g/dL (ref 3.5–5.0)
Alkaline Phosphatase: 86 U/L (ref 38–126)
Anion gap: 11 (ref 5–15)
BUN: 17 mg/dL (ref 6–20)
CO2: 23 mmol/L (ref 22–32)
Calcium: 8.9 mg/dL (ref 8.9–10.3)
Chloride: 103 mmol/L (ref 98–111)
Creatinine, Ser: 0.83 mg/dL (ref 0.44–1.00)
GFR, Estimated: >60 mL/min (ref >60)
Glucose, Bld: 128 mg/dL — ABNORMAL HIGH (ref 70–99)
Potassium: 3.4 mmol/L — ABNORMAL LOW (ref 3.5–5.1)
Sodium: 137 mmol/L (ref 135–145)
Total Bilirubin: 0.5 mg/dL (ref 0.3–1.2)
Total Protein: 7.6 g/dL (ref 6.5–8.1)

## 2021-04-15 LAB — CBC WITH DIFFERENTIAL/PLATELET
Abs Immature Granulocytes: 0.01 10*3/uL (ref 0.00–0.07)
Basophils Absolute: 0.1 10*3/uL (ref 0.0–0.1)
Basophils Relative: 1 %
Eosinophils Absolute: 0.1 10*3/uL (ref 0.0–0.5)
Eosinophils Relative: 1 %
HCT: 34.6 % — ABNORMAL LOW (ref 36.0–46.0)
Hemoglobin: 11.6 g/dL — ABNORMAL LOW (ref 12.0–15.0)
Immature Granulocytes: 0 %
Lymphocytes Relative: 26 %
Lymphs Abs: 2 10*3/uL (ref 0.7–4.0)
MCH: 30.2 pg (ref 26.0–34.0)
MCHC: 33.5 g/dL (ref 30.0–36.0)
MCV: 90.1 fL (ref 80.0–100.0)
Monocytes Absolute: 0.8 10*3/uL (ref 0.1–1.0)
Monocytes Relative: 10 %
Neutro Abs: 4.7 10*3/uL (ref 1.7–7.7)
Neutrophils Relative %: 62 %
Platelets: 311 10*3/uL (ref 150–400)
RBC: 3.84 MIL/uL — ABNORMAL LOW (ref 3.87–5.11)
RDW: 13.2 % (ref 11.5–15.5)
WBC: 7.7 10*3/uL (ref 4.0–10.5)
nRBC: 0 % (ref 0.0–0.2)

## 2021-04-15 LAB — TSH: TSH: 2.732 u[IU]/mL (ref 0.350–4.500)

## 2021-04-15 LAB — T4, FREE: Free T4: 0.77 ng/dL (ref 0.61–1.12)

## 2021-04-15 MED ORDER — ACETAMINOPHEN 325 MG PO TABS
650.0000 mg | ORAL_TABLET | Freq: Once | ORAL | Status: AC
  Administered 2021-04-15: 650 mg via ORAL
  Filled 2021-04-15: qty 2

## 2021-04-15 MED ORDER — TRASTUZUMAB-ANNS CHEMO 150 MG IV SOLR
6.0000 mg/kg | Freq: Once | INTRAVENOUS | Status: AC
  Administered 2021-04-15: 504 mg via INTRAVENOUS
  Filled 2021-04-15: qty 24

## 2021-04-15 MED ORDER — ZOLEDRONIC ACID 4 MG/100ML IV SOLN
4.0000 mg | Freq: Once | INTRAVENOUS | Status: AC
  Administered 2021-04-15: 4 mg via INTRAVENOUS
  Filled 2021-04-15: qty 100

## 2021-04-15 MED ORDER — HEPARIN SOD (PORK) LOCK FLUSH 100 UNIT/ML IV SOLN
500.0000 [IU] | Freq: Once | INTRAVENOUS | Status: AC | PRN
  Administered 2021-04-15: 500 [IU]
  Filled 2021-04-15: qty 5

## 2021-04-15 MED ORDER — SODIUM CHLORIDE 0.9 % IV SOLN
420.0000 mg | Freq: Once | INTRAVENOUS | Status: AC
  Administered 2021-04-15: 420 mg via INTRAVENOUS
  Filled 2021-04-15: qty 14

## 2021-04-15 MED ORDER — POTASSIUM CHLORIDE IN NACL 20-0.9 MEQ/L-% IV SOLN
Freq: Once | INTRAVENOUS | Status: AC
  Filled 2021-04-15: qty 1000

## 2021-04-15 MED ORDER — SODIUM CHLORIDE 0.9 % IV SOLN
Freq: Once | INTRAVENOUS | Status: AC
  Filled 2021-04-15: qty 250

## 2021-04-15 MED ORDER — DIPHENHYDRAMINE HCL 25 MG PO CAPS
50.0000 mg | ORAL_CAPSULE | Freq: Once | ORAL | Status: AC
  Administered 2021-04-15: 50 mg via ORAL
  Filled 2021-04-15: qty 2

## 2021-04-15 MED ORDER — FULVESTRANT 250 MG/5ML IM SOLN
500.0000 mg | Freq: Once | INTRAMUSCULAR | Status: AC
  Administered 2021-04-15: 500 mg via INTRAMUSCULAR
  Filled 2021-04-15: qty 10

## 2021-04-15 NOTE — Progress Notes (Signed)
Attempted to schedule.  LMOV to call office.  ° °

## 2021-04-15 NOTE — Progress Notes (Signed)
Patient has a change in taste for the past 2 weeks, occ nausea episodes, increase in hot flashes, and feels like her face/neck are swollen.

## 2021-04-15 NOTE — Patient Instructions (Signed)
[facility] [place] [facility]  Discharge Instructions: Thank you for choosing [facility] to provide your oncology and hematology care.  If you have a lab appointment with the Cancer Center, please go directly to the Cancer Center and check in at the registration area.  Wear comfortable clothing and clothing appropriate for easy access to any Portacath or PICC line.   We strive to give you quality time with your provider. You may need to reschedule your appointment if you arrive late (15 or more minutes).  Arriving late affects you and other patients whose appointments are after yours.  Also, if you miss three or more appointments without notifying the office, you may be dismissed from the clinic at the provider's discretion.      For prescription refill requests, have your pharmacy contact our office and allow 72 hours for refills to be completed.    Today you received the following chemotherapy and/or immunotherapy agents : Trastuzumab, Pertuzumab      To help prevent nausea and vomiting after your treatment, we encourage you to take your nausea medication as directed.  BELOW ARE SYMPTOMS THAT SHOULD BE REPORTED IMMEDIATELY: *FEVER GREATER THAN 100.4 F (38 C) OR HIGHER *CHILLS OR SWEATING *NAUSEA AND VOMITING THAT IS NOT CONTROLLED WITH YOUR NAUSEA MEDICATION *UNUSUAL SHORTNESS OF BREATH *UNUSUAL BRUISING OR BLEEDING *URINARY PROBLEMS (pain or burning when urinating, or frequent urination) *BOWEL PROBLEMS (unusual diarrhea, constipation, pain near the anus) TENDERNESS IN MOUTH AND THROAT WITH OR WITHOUT PRESENCE OF ULCERS (sore throat, sores in mouth, or a toothache) UNUSUAL RASH, SWELLING OR PAIN  UNUSUAL VAGINAL DISCHARGE OR ITCHING   Items with * indicate a potential emergency and should be followed up as soon as possible or go to the Emergency Department if any problems should occur.  Please show the CHEMOTHERAPY ALERT CARD or IMMUNOTHERAPY ALERT  CARD at check-in to the Emergency Department and triage nurse.  Should you have questions after your visit or need to cancel or reschedule your appointment, please contact [facility] [place] [facility]  [phone] and follow the prompts.  Office hours are 8:00 a.m. to 4:30 p.m. Monday - Friday. Please note that voicemails left after 4:00 p.m. may not be returned until the following business day.  We are closed weekends and major holidays. You have access to a nurse at all times for urgent questions. Please call the main number to the clinic [phone] and follow the prompts.  For any non-urgent questions, you may also contact your provider using MyChart. We now offer e-Visits for anyone 18 and older to request care online for non-urgent symptoms. For details visit [email or web address].   Also download the MyChart app! Go to the app store, search "MyChart", open the app, select [place], and log in with your MyChart username and password.  Due to Covid, a mask is required upon entering the hospital/clinic. If you do not have a mask, one will be given to you upon arrival. For doctor visits, patients may have 1 support person aged 18 or older with them. For treatment visits, patients cannot have anyone with them due to current Covid guidelines and our immunocompromised population.  

## 2021-04-15 NOTE — Progress Notes (Signed)
Per Dr. Tasia Catchings, [place] to treat with elevated HR.

## 2021-04-15 NOTE — Progress Notes (Signed)
Hematology/Oncology Follow Up Note [place] [phone] [phone]  Patient Care Team: Marguerita Merles, MD as PCP - General (Family Medicine) End, Harrell Gave, MD as PCP - Cardiology (Cardiology) Rico Junker, RN as Oncology Nurse Navigator Earlie Server, MD as Consulting Physician (Oncology) Bary Castilla, Forest Gleason, MD (General Surgery) Noreene Filbert, MD as Referring Physician (Radiation Oncology) Noreene Filbert, MD as Radiation Oncologist (Radiation Oncology) Noreene Filbert, MD as Radiation Oncologist (Radiation Oncology)   Name of the patient: Theresa Chaney  [id number]  13-Feb-1981   Date of visit: 04/15/21 REASON FOR VISIT Follow up for Assessment prior to chemotherapy treatment of breast cancer  Oncology History Cancer TREATMENT Neoadjuvant ddAC +one dose of Taxol, due to lack of response, surgery was offered. Case was discussed on breast tumor board. 03/19/2018 S/p right mastectomy and right axillary dissection, immediate breast reconstruction with placement of expanders.  ypT3 ypN2, + lymphovascular invasion,  Grade 3, margin is negative, close. ER 90%, PR 0%, HER2 IHC negative.  Also had elective bilateral salpingo-oophorectomy..  # 06/11/2018 s/p 11 cycles Taxol adjuvantly. Tolerated well.  # She has obtained dental clearance for starting Zometa.  S/p Zometa on 6/3/ 2019 # s/p adjuvant right chest wall radiation, finished 10/10/2018 #06/03/2019 underwent elective left prophylactic mastectomy and sentinel lymph node biopsy of left axilla.  # 06/03/2019 underwent elective left prophylactic mastectomy and sentinel lymph node biopsy of left axilla. Pathology negative for malignancy. Status post Mediport removal on 06/03/2019. She also underwent right implant removal on 06/03/2019. She also underwent right implant removal on 06/03/2019. #Negative genetic testing  #  chemotherapy-induced neuropathy, bilateral fingertips and lower extremities.   Patient is on Lyrica and nortriptyline.  Follows up with neurology. She follows up with lymphedema clinic.  #  07/07/2020, MRI thoracic spine without contrast showed lesions involving the T7 posterior elements most concerning for metastatic lesion.  No evidence of epidural tumor.  Minimal thoracic spondylosis without stenosis. MRI was reviewed by me and a PET scan was obtained for further evaluation. 07/20/2020, PET scan showed hypermetabolic metastasis involving the posterior element of T7, no additional evidence of metastasis in the neck, chest, abdomen or pelvis.  # 07/29/2020 T7 lesion biopsy showed metastatic carcinoma, compatible with breast origin.  Receptor status staining showed ER 71-80% positive, PR negative, HER-2 positive IHC 3+ # Patient finished spine radiation on 08/31/2020   positive for COVID-19 on 01/11/2021  # 03/15/2021, PET scan showed no focal hypermetabolic activity to suggest skeletal metastasis.  Mild hypermetabolic activity along the right T7-8 paraspinal musculature.  Max SUV 3.3.  Likely postprocedural-    INTERVAL HISTORY 40 yo female with above oncology history reviewed by me presents for follow-up of management of metastatic breast cancer.  Intermittent chronic back pain, sometimes triggered by certain position, i.e. prolonged standing position,  She has felt nausea this morning and had a heart rate of 145.  Nausea has resolved Today in the clinic her heart rate was 111.  She feels palpitation. No vomiting or diarrhea Hot flashes getting worse She feels her neck is swollen more    Review of Systems  Constitutional: Negative for chills, fever, malaise/fatigue and weight loss.  HENT: Negative for sore throat.   Eyes: Negative for redness.  Respiratory: Negative for cough, shortness of breath and wheezing.   Cardiovascular: Negative for chest pain, palpitations and leg swelling.  Gastrointestinal: Positive for nausea. Negative for abdominal pain, blood in stool,  heartburn and vomiting.  Genitourinary: Negative for dysuria.  Musculoskeletal: Positive for back pain and joint pain. Negative for myalgias.  Skin: Negative for rash.  Neurological: Positive for tingling. Negative for dizziness and tremors.  Endo/Heme/Allergies: Does not bruise/bleed easily.  Psychiatric/Behavioral: Negative for hallucinations.    No Known Allergies  Patient Active Problem List   Diagnosis Date Noted  . Bone metastasis ([place]) 03/25/2021  . Encounter for monoclonal antibody treatment for malignancy 12/10/2020  . Bone lesion 11/19/2020  . Hypocalcemia 11/19/2020  . Inflammatory arthritis 11/19/2020  . Encounter for antineoplastic chemotherapy 10/29/2020  . Metastatic breast cancer ([place]) 08/31/2020  . Goals of care, counseling/discussion 08/11/2020  . Neuropathy due to chemotherapeutic drug ([place]) 08/11/2020  . HER2-positive carcinoma of breast ([place]) 08/11/2020  . Rheumatoid arteritis ([place]) 10/13/2019  . Bilateral hand swelling 10/03/2019  . Fracture of neck of metacarpal bone 05/14/2019  . Chronic fatigue 04/09/2019  . Polyarthralgia 04/09/2019  . Status post right breast reconstruction 02/26/2019  . Status post right mastectomy 02/26/2019  . Mastalgia 02/15/2019  . Shortness of breath 08/23/2018  . Nonischemic cardiomyopathy ([place]) 08/23/2018  . Preprocedural cardiovascular examination 08/23/2018  . Tachycardia 08/23/2018  . Palpitations 08/23/2018  . Estrogen receptor positive status (ER+) 04/04/2018  . Acquired absence of right breast and nipple 04/03/2018  . Breast cancer of upper-outer quadrant of right female breast ([place]) 03/19/2018  . Family history of cancer   . Malignant neoplasm of overlapping sites of right breast in female, estrogen receptor positive ([place]) 10/19/2017  . Gastroesophageal reflux disease without esophagitis 02/24/2017  . Generalized anxiety disorder 10/03/2014  . Headache 10/03/2014     Past Medical History:  Diagnosis Date  .  Anemia   . Arthritis   . BRCA negative 11/26/2017  . Breast cancer ([place]) 10/11/2017   Multifocal, ER positive, PR negative, HER-2 negative. ypT3 ypN2a 8.7 cm, 4/15 nodes  . Cardiomyopathy ([place])    a. 10/2017 Echo: EF 60-65%, no rwma, Gr1 DD, nl RV size/fxn; b. 04/2018 Echo: EF 55-60%, no rwma, Nl RV size/fxn; c. 08/2018 Echo: EF 45%, diff HK, ? HK of antsept wall. Gr1 DD. Mild MR. Mild LAE/RAE. Mod dil RV.   [name] Chronic bronchitis ([place]) 11/2017  . COPD (chronic obstructive pulmonary disease) ([place])    MILD PER CXR  . Depression   . Family history of cancer   . GERD (gastroesophageal reflux disease)   . Headache    MIGRAINES  . Heart murmur    ASYMPTOMATIC  . Personal history of chemotherapy    current for right breast ca     Past Surgical History:  Procedure Laterality Date  . AXILLARY LYMPH NODE DISSECTION Right 03/19/2018   Procedure: AXILLARY LYMPH NODE DISSECTION;  Surgeon: Robert Bellow, MD;  Location: [facility] OR[facility];  Service: General;  Laterality: Right;  . BREAST BIOPSY Right 10/11/2017   12:30 posterior coil clip invasive mammary carcinoma  . BREAST BIOPSY Right 10/11/2017   11:30 middle depth ribbon clip DCIS  . BREAST BIOPSY Right 10/11/2017   5:30 anterior depth x shape invasive ductal carcinoma  . BREAST IMPLANT REMOVAL Right 06/03/2019   Procedure: REMOVAL OF RIGHT BREAST IMPLANTS;  Surgeon: Wallace Going, DO;  Location: [facility];  Service: Plastics;  Laterality: Right;  . BREAST RECONSTRUCTION WITH PLACEMENT OF TISSUE EXPANDER AND FLEX HD (ACELLULAR HYDRATED DERMIS) Right 03/19/2018   Procedure: BREAST RECONSTRUCTION WITH PLACEMENT OF TISSUE EXPANDER AND FLEX HD (ACELLULAR HYDRATED DERMIS);  Surgeon: Wallace Going, DO;  Location: [facility];  Service: Plastics;  Laterality: Right;  .  CARPAL TUNNEL RELEASE Bilateral 2020  . CHOLECYSTECTOMY N/A 04/27/2020   Procedure: LAPAROSCOPIC CHOLECYSTECTOMY WITH INTRAOPERATIVE CHOLANGIOGRAM;  Surgeon: Robert Bellow, MD;   Location: [facility];  Service: General;  Laterality: N/A;  . ESOPHAGOGASTRODUODENOSCOPY (EGD) WITH PROPOFOL N/A 04/17/2020   Procedure: ESOPHAGOGASTRODUODENOSCOPY (EGD) WITH PROPOFOL;  Surgeon: Robert Bellow, MD;  Location: [facility];  Service: Endoscopy;  Laterality: N/A;  with biopsy  . LAPAROSCOPIC BILATERAL SALPINGO OOPHERECTOMY Bilateral 03/19/2018   Procedure: LAPAROSCOPIC BILATERAL SALPINGO OOPHORECTOMY;  Surgeon: Benjaman Kindler, MD;  Location: [facility];  Service: Gynecology;  Laterality: Bilateral;  . MASTECTOMY Right 03/2018  . MASTECTOMY W/ SENTINEL NODE BIOPSY Right 03/19/2018   Procedure: MASTECTOMY WITH SENTINEL LYMPH NODE BIOPSY;  Surgeon: Robert Bellow, MD;  Location: [facility];  Service: General;  Laterality: Right;  . PORT-A-CATH REMOVAL Left 06/03/2019   Procedure: REMOVAL PORT-A-CATH;  Surgeon: Robert Bellow, MD;  Location: [facility];  Service: General;  Laterality: Left;  . PORTACATH PLACEMENT Left 10/24/2017   Procedure: INSERTION PORT-A-CATH;  Surgeon: Robert Bellow, MD;  Location: [facility];  Service: General;  Laterality: Left;  . PORTACATH PLACEMENT Right 09/28/2020   Procedure: INSERTION PORT-A-CATH;  Surgeon: Robert Bellow, MD;  Location: [facility];  Service: General;  Laterality: Right;  . REMOVAL OF TISSUE EXPANDER AND PLACEMENT OF IMPLANT Right 07/20/2018   Procedure: REMOVAL OF RIGHT BREAST TISSUE EXPANDER AND PLACEMENT OF IMPLANT;  Surgeon: Wallace Going, DO;  Location: [facility];  Service: Plastics;  Laterality: Right;  . SIMPLE MASTECTOMY WITH AXILLARY SENTINEL NODE BIOPSY Left 06/03/2019   Procedure: SIMPLE MASTECTOMY LEFT;  Surgeon: Robert Bellow, MD;  Location: [facility];  Service: General;  Laterality: Left;    Social History   Socioeconomic History  . Marital status: Married    Spouse name: Not on file  . Number of children: Not on file  . Years of education: Not on file  . Highest education level: Not on  file  Occupational History  . Occupation: Occupational psychologist    Comment: Therapist, art   Tobacco Use  . Smoking status: Current Every Day Smoker    Packs/day: 0.50    Years: 18.00    Pack years: 9.00    Types: Cigarettes    Start date: 06/21/2018  . Smokeless tobacco: Never Used  Vaping Use  . Vaping Use: Never used  Substance and Sexual Activity  . Alcohol use: No  . Drug use: No  . Sexual activity: Yes    Birth control/protection: Injection, Other-see comments    Comment: has had hysterectomy  Other Topics Concern  . Not on file  Social History Narrative   Lives at home with husband and daughter   Social Determinants of Health   Financial Resource Strain: Not on file  Food Insecurity: Not on file  Transportation Needs: Not on file  Physical Activity: Not on file  Stress: Not on file  Social Connections: Not on file  Intimate Partner Violence: Not on file     Family History  Problem Relation Age of Onset  . Melanoma Maternal Aunt        other aunts with BCC/SCC/Melanoma  . Diabetes Father   . Hypertension Father   . Hyperlipidemia Father   . Heart attack Father 34       "mild"  . Bladder Cancer Maternal Grandmother   . Cervical cancer Maternal Aunt 73       daughter w/ cervical cancer  as well  . Melanoma Maternal Uncle        other uncles with BCC/SCC/Melanoma     Current Outpatient Medications:  .  acetaminophen (TYLENOL) 500 MG tablet, Take 500 mg by mouth every 6 (six) hours as needed., Disp: , Rfl:  .  albuterol (VENTOLIN HFA) 108 (90 Base) MCG/ACT inhaler, Inhale 2 puffs into the lungs every 6 (six) hours as needed for wheezing or shortness of breath. , Disp: , Rfl:  .  Calcium Carbonate-Vitamin D3 (CALCIUM 600/VITAMIN D) 600-400 MG-UNIT TABS, Take 2 tablets by mouth daily. (Patient taking differently: Take 3 tablets by mouth daily.), Disp: 180 tablet, Rfl: 0 .  cholecalciferol (VITAMIN D3) 25 MCG (1000 UNIT) tablet, Take 1 tablet  (1,000 Units total) by mouth daily., Disp: 90 tablet, Rfl: 0 .  cyanocobalamin (,VITAMIN B-12,) 1000 MCG/ML injection, Inject 1,000 mcg into the skin every 30 (thirty) days., Disp: , Rfl:  .  cyclobenzaprine (FLEXERIL) 5 MG tablet, Take 5 mg by mouth 3 (three) times daily as needed for muscle spasms. , Disp: , Rfl:  .  diphenoxylate-atropine (LOMOTIL) 2.5-0.025 MG tablet, Take 1 tablet by mouth 4 (four) times daily as needed for diarrhea or loose stools. (Patient not taking: No sig reported), Disp: 60 tablet, Rfl: 0 .  escitalopram (LEXAPRO) 20 MG tablet, Take 20 mg by mouth daily. , Disp: , Rfl:  .  esomeprazole (NEXIUM) 40 MG capsule, Take 40 mg by mouth daily before breakfast. , Disp: , Rfl:  .  Eszopiclone 3 MG TABS, Take 3 mg by mouth at bedtime as needed (sleep). , Disp: , Rfl:  .  folic acid (FOLVITE) 1 MG tablet, Take 1 mg by mouth daily., Disp: , Rfl:  .  ibuprofen (ADVIL) 800 MG tablet, Take 800 mg by mouth every 8 (eight) hours as needed for moderate pain.  (Patient not taking: Reported on 03/25/2021), Disp: , Rfl:  .  lidocaine-prilocaine (EMLA) cream, Apply to port and cover 1-2 hours prior to appointment, Disp: 30 g, Rfl: 3 .  loperamide (IMODIUM) 2 MG capsule, Take 2 tablets with onset of diarrhea, then take 1 tablet every 2 hours until diarrhea stops. Maximum 8 tablets in 24hours (Patient not taking: No sig reported), Disp: 30 capsule, Rfl: 3 .  loratadine (CLARITIN) 10 MG tablet, Take 10 mg by mouth daily. , Disp: , Rfl:  .  LORazepam (ATIVAN) 1 MG tablet, Take 1 mg by mouth 3 (three) times daily., Disp: , Rfl:  .  Magnesium 400 MG TABS, Take 400 mg by mouth 2 (two) times daily., Disp: , Rfl:  .  meloxicam (MOBIC) 7.5 MG tablet, Take 7.5 mg by mouth 2 (two) times daily as needed (pain/inflammation.). , Disp: , Rfl:  .  methotrexate (RHEUMATREX) 2.5 MG tablet, Take 20 mg by mouth every Sunday. , Disp: , Rfl:  .  metoprolol succinate (TOPROL-XL) 25 MG 24 hr tablet, Take 37.5 mg by mouth  every morning. , Disp: , Rfl:  .  mupirocin nasal ointment (BACTROBAN) 2 %, Place 1 application into the nose 2 (two) times daily. Use one-half of tube in each nostril twice daily for five (5) days. After application, press sides of nose together and gently massage., Disp: 10 g, Rfl: 0 .  nortriptyline (PAMELOR) 10 MG capsule, Take 30 mg by mouth at bedtime. , Disp: , Rfl:  .  oxyCODONE (OXY IR/ROXICODONE) 5 MG immediate release tablet, Take 1 tablet (5 mg total) by mouth every 6 (six) hours as needed  for moderate pain or severe pain., Disp: 60 tablet, Rfl: 0 .  phentermine (ADIPEX-P) 37.5 MG tablet, Take 37.5 mg by mouth daily before breakfast. , Disp: , Rfl:  .  pregabalin (LYRICA) 150 MG capsule, Take 150 mg by mouth 2 (two) times daily., Disp: , Rfl:  .  promethazine (PHENERGAN) 25 MG tablet, Take 1 tablet (25 mg total) by mouth every 8 (eight) hours as needed for nausea or vomiting., Disp: 90 tablet, Rfl: 0 .  pyridOXINE (VITAMIN B-6) 100 MG tablet, Take 100 mg by mouth daily., Disp: , Rfl:  .  topiramate (TOPAMAX) 50 MG tablet, Take 50 mg by mouth 2 (two) times daily. , Disp: , Rfl:  .  vitamin C (ASCORBIC ACID) 500 MG tablet, Take 500 mg by mouth daily., Disp: , Rfl:  No current facility-administered medications for this visit.  Facility-Administered Medications Ordered in Other Visits:  .  heparin lock flush 100 unit/mL, 500 Units, Intravenous, Once, Earlie Server, MD .  sodium chloride flush (NS) 0.9 % injection 10 mL, 10 mL, Intravenous, PRN, Earlie Server, MD, 10 mL at 11/19/18 1249 .  sodium chloride flush (NS) 0.9 % injection 10 mL, 10 mL, Intravenous, Once, Earlie Server, MD   Physical exam:  Vitals:   04/15/21 0840 04/15/21 0917  BP: 116/72   Pulse: (!) 111 (!) 102  Resp: 18   Temp: (!) 97.1 F (36.2 C)   Weight: 192 lb 1.6 oz (87.1 kg)   ECOG 1 Physical Exam Constitutional:      General: She is not in acute distress.    Appearance: She is not diaphoretic.  HENT:     Head:  Normocephalic and atraumatic.     Nose: Nose normal.     Mouth/Throat:     Pharynx: No oropharyngeal exudate.  Eyes:     General: No scleral icterus.       Left eye: No discharge.     Conjunctiva/sclera: Conjunctivae normal.     Pupils: Pupils are equal, round, and reactive to light.  Neck:     Vascular: No JVD.  Cardiovascular:     Rate and Rhythm: Regular rhythm. Tachycardia present.     Heart sounds: Normal heart sounds. No murmur heard.   Pulmonary:     Effort: Pulmonary effort is normal. No respiratory distress.     Breath sounds: Normal breath sounds. No wheezing or rales.  Chest:     Chest wall: No tenderness.  Abdominal:     General: Bowel sounds are normal. There is no distension.     Palpations: Abdomen is soft. There is no mass.     Tenderness: There is no abdominal tenderness. There is no rebound.  Musculoskeletal:        General: Normal range of motion.     Cervical back: Normal range of motion and neck supple.  Lymphadenopathy:     Cervical: No cervical adenopathy.  Skin:    General: Skin is warm and dry.     Findings: No erythema or rash.  Neurological:     Mental Status: She is alert and oriented to person, place, and time.     Cranial Nerves: No cranial nerve deficit.     Motor: No abnormal muscle tone.     Coordination: Coordination normal.  Psychiatric:        Mood and Affect: Affect normal.           CMP Latest Ref Rng & Units 03/25/2021  Glucose 70 - 99 mg/dL  115(H)  BUN 6 - 20 mg/dL 15  Creatinine 0.44 - 1.00 mg/dL 0.90  Sodium 135 - 145 mmol/L 139  Potassium 3.5 - 5.1 mmol/L 3.6  Chloride 98 - 111 mmol/L 107  CO2 22 - 32 mmol/L 23  Calcium 8.9 - 10.3 mg/dL 8.6(L)  Total Protein 6.5 - 8.1 g/dL 7.3  Total Bilirubin 0.3 - 1.2 mg/dL 0.3  Alkaline Phos 38 - 126 U/L 75  AST 15 - 41 U/L 31  ALT 0 - 44 U/L 21   CBC Latest Ref Rng & Units 03/25/2021  WBC 4.0 - 10.5 K/uL 6.8  Hemoglobin 12.0 - 15.0 g/dL 10.4(L)  Hematocrit 36.0 - 46.0 %  31.0(L)  Platelets 150 - 400 K/uL 293   RADIOGRAPHIC STUDIES: I have personally reviewed the radiological images as listed and agreed with the findings in the report. NM PET Image Restag (PS) Skull Base To Thigh  Result Date: 03/16/2021 CLINICAL DATA:  Subsequent treatment strategy for metastatic breast cancer. EXAM: NUCLEAR MEDICINE PET SKULL BASE TO THIGH TECHNIQUE: 10.9 mCi F-18 FDG was injected intravenously. Full-ring PET imaging was performed from the skull base to thigh after the radiotracer. CT data was obtained and used for attenuation correction and anatomic localization. Fasting blood glucose: 108 mg/dl COMPARISON:  PET-CT dated 12/03/2020 FINDINGS: Mediastinal blood pool activity: SUV max 2.0 Liver activity: SUV max NA NECK: No hypermetabolic cervical lymphadenopathy. Incidental CT findings: none CHEST: No suspicious pulmonary nodules. Status post bilateral mastectomy. No metabolic thoracic lymphadenopathy. Status post right axillary lymph node dissection. Right chest port terminates the cavoatrial junction. Incidental CT findings: none ABDOMEN/PELVIS: No abnormal hypermetabolism in the liver, spleen, pancreas, or adrenal glands. No hypermetabolic abdominopelvic lymphadenopathy. Incidental CT findings: Status post cholecystectomy. SKELETON: No focal hypermetabolic activity to suggest skeletal metastasis. Mild hypermetabolism along the right T7-8 paraspinal musculature, max SUV 3.3, likely postprocedural. Incidental CT findings: none IMPRESSION: Status post bilateral mastectomy and right axillary lymph node dissection. Mild residual post procedure hypermetabolism involving the right T7-8 paraspinal musculature. No findings suspicious for recurrent or metastatic disease. Electronically Signed   By: Julian Hy M.D.   On: 03/16/2021 09:11   ECHOCARDIOGRAM LIMITED  Result Date: 01/30/2021    ECHOCARDIOGRAM LIMITED REPORT   Patient Name:   NICHOLE NEYER Date of Exam: 01/29/2021 Medical Rec #:   [id number]      Height:       67.0 in Accession #:    [id number]     Weight:       193.3 lb Date of Birth:  10/25/1981      BSA:          1.993 m Patient Age:    76 years       BP:           112/70 mmHg Patient Gender: F              HR:           96 bpm. Exam Location:  [facility] Procedure: Limited Echo Indications:     Chemotherapy follow- up evaluation V 67.2  History:         Patient has prior history of Echocardiogram examinations, most                  recent 09/14/2020. Cardiomyopathy, COPD; Signs/Symptoms:Murmur.  Sonographer:     Sherrie Sport RDCS (AE) Referring Phys:  [id number] [place] Diagnosing Phys: Serafina Royals MD IMPRESSIONS  1. Left ventricular ejection fraction, by estimation, is 55  to 60%. The left ventricle has normal function. The left ventricle has no regional wall motion abnormalities. Left ventricular diastolic function could not be evaluated.  2. Right ventricular systolic function is normal. The right ventricular size is normal.  3. The mitral valve is normal in structure. Trivial mitral valve regurgitation.  4. The aortic valve is normal in structure. Aortic valve regurgitation is not visualized. FINDINGS  Left Ventricle: Left ventricular ejection fraction, by estimation, is 55 to 60%. The left ventricle has normal function. The left ventricle has no regional wall motion abnormalities. The left ventricular internal cavity size was normal in size. There is  no left ventricular hypertrophy. Left ventricular diastolic function could not be evaluated. Right Ventricle: The right ventricular size is normal. No increase in right ventricular wall thickness. Right ventricular systolic function is normal. Left Atrium: Left atrial size was normal in size. Right Atrium: Right atrial size was normal in size. Pericardium: Trivial pericardial effusion is present. Mitral Valve: The mitral valve is normal in structure. Trivial mitral valve regurgitation. Tricuspid Valve: The tricuspid valve is normal in structure.  Tricuspid valve regurgitation is trivial. Aortic Valve: The aortic valve is normal in structure. Aortic valve regurgitation is not visualized. Pulmonic Valve: The pulmonic valve was normal in structure. Pulmonic valve regurgitation is not visualized. Aorta: The aortic root and ascending aorta are structurally normal, with no evidence of dilitation. IAS/Shunts: No atrial level shunt detected by color flow Doppler. LEFT VENTRICLE PLAX 2D LVIDd:         4.08 cm LVIDs:         2.85 cm LV PW:         0.83 cm LV IVS:        0.87 cm LVOT diam:     2.00 cm  3D Volume EF: LVOT Area:     3.14 cm 3D EF:        27 %                         LV EDV:       156 ml                         LV ESV:       113 ml                         LV SV:        42 ml LEFT ATRIUM             Index       RIGHT ATRIUM           Index LA diam:        2.80 cm 1.40 cm/m  RA Area:     11.80 cm LA Vol (A2C):   22.8 ml 11.44 ml/m RA Volume:   29.10 ml  14.60 ml/m LA Vol (A4C):   12.9 ml 6.47 ml/m LA Biplane Vol: 17.7 ml 8.88 ml/m                        PULMONIC VALVE AORTA                 PV Vmax:        0.65 m/s Ao Root diam: 2.90 cm PV Peak grad:   1.7 mmHg  RVOT Peak grad: 3 mmHg  TRICUSPID VALVE TR Peak grad:   16.2 mmHg TR Vmax:        201.00 cm/s  SHUNTS Systemic Diam: 2.00 cm Serafina Royals MD Electronically signed by Serafina Royals MD Signature Date/Time: 01/30/2021/7:54:56 AM    Final         Assessment and plan- Patient is a 40 y.o. female presents for evaluation of newly diagnosed multicentric right breast cancer.  Cancer Staging Breast cancer of upper-outer quadrant of right female breast [facility]) Staging form: Breast, AJCC 8th Edition - Clinical: G2, ER+, PR+, HER2- - Signed by Earlie Server, MD on 04/05/2018 Histologic grading system: 3 grade system - Pathologic stage from 04/04/2018: No Stage Recommended (ypT3, pN2, cM0, G3, ER+, PR-, HER2-) - Signed by Earlie Server, MD on 04/04/2018 Stage prefix:  Post-therapy Neoadjuvant therapy: Yes Response to neoadjuvant therapy: No response Method of lymph node assessment: Axillary lymph node dissection Multigene prognostic tests performed: None Histologic grading system: 3 grade system Laterality: Right - Pathologic: Stage IV (rpTX, pNX, cM1, ER+, PR-, HER2+) - Signed by Earlie Server, MD on 12/10/2020 Stage prefix: Recurrence    1. Malignant neoplasm of overlapping sites of right breast in female, estrogen receptor positive ([place])   2. HER2-positive carcinoma of breast ([place])   3. Encounter for monoclonal antibody treatment for malignancy   4. Bone metastasis ([place])   5. 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 Tachycardia, unspecified    Breast cancer with isolated bone metastasis, stage IV #Initially stage IIIA right breast cancer, ER positive, HER-2 negative status post bilateral oophorectomy, Right mastectomy and right axillary lymph node dissection, status post implant, and implant removal.  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 Estrogen positive, HER-2 positive breast cancer. Status post  radiation to T7  Labs are reviewed and discussed with patient. Proceed with transtuzumab and pertuzumab today.  Fulvestrant monthly.    #Tachycardia, chronic intermittent issue for her.  Heart rate was higher than her baseline.  01/29/21 2D echocardiogram LVEF 55%-60%. Check EKG.  EKG showed HR of 102, sinus tachycardia. Recommend patient to repeat Echo, and follow up with Dr.End- last seen -2019.  She is on metoprolol.   # Hypokalemia, she reports oral potassium causes her to have cramps. IV Kcl 35mq over 1 hour.  #Treatment induced diarrhea,Continue Lomotil as needed.  Symptoms are stable. #Bone metastasis, status post radiation. Continue Zometa monthly.  #Back pain, monitor symptoms.  continue oxycodone 5 mg daily 6 hours as  needed #Chemotherapy-induced neuropathy, on Lyrica and nortriptyline, continue.  #Depression.  Continue Lexapro 20 mg daily. #Rheumatoid arthritis,   Follow up with  rheumatology. # Hot flash due to postmenopausal state, already on Lexapro. Can not combine with Effexor.    Follow-up in 3 weeks for the next cycle of trastuzumab and Pertuzumab treatments.  Patient knows to call clinic if she experiences concerning side effects  ZEarlie Server MD, PhD Hematology Oncology [facility]05/05/22

## 2021-04-16 NOTE — Progress Notes (Signed)
Scheduled

## 2021-04-20 ENCOUNTER — Other Ambulatory Visit: Payer: Self-pay | Admitting: Oncology

## 2021-04-20 ENCOUNTER — Encounter: Payer: Self-pay | Admitting: Oncology

## 2021-04-20 MED ORDER — OXYCODONE HCL 5 MG PO TABS: 5.0000 mg | ORAL_TABLET | Freq: Four times a day (QID) | ORAL | 0 refills | Status: DC | PRN

## 2021-04-22 ENCOUNTER — Encounter: Payer: Self-pay | Admitting: Oncology

## 2021-04-23 ENCOUNTER — Ambulatory Visit: Payer: Medicare HMO | Admitting: Physician Assistant

## 2021-05-05 ENCOUNTER — Ambulatory Visit
Admission: RE | Admit: 2021-05-05 | Discharge: 2021-05-05 | Disposition: A | Discharge status: Home / Self Care | Payer: Medicare HMO | Source: Ambulatory Visit | Attending: Oncology | Admitting: Oncology

## 2021-05-05 ENCOUNTER — Other Ambulatory Visit: Payer: Self-pay

## 2021-05-05 ENCOUNTER — Other Ambulatory Visit: Payer: Medicare HMO

## 2021-05-05 ENCOUNTER — Ambulatory Visit: Payer: Medicare HMO

## 2021-05-05 DIAGNOSIS — R011 Cardiac murmur, unspecified: Secondary | ICD-10-CM

## 2021-05-05 DIAGNOSIS — Z9221 Personal history of antineoplastic chemotherapy: Secondary | ICD-10-CM | POA: Diagnosis not present

## 2021-05-05 DIAGNOSIS — I429 Cardiomyopathy, unspecified: Secondary | ICD-10-CM | POA: Insufficient documentation

## 2021-05-05 DIAGNOSIS — R000 Tachycardia, unspecified: Secondary | ICD-10-CM | POA: Diagnosis not present

## 2021-05-05 DIAGNOSIS — J449 Chronic obstructive pulmonary disease, unspecified: Secondary | ICD-10-CM | POA: Insufficient documentation

## 2021-05-05 LAB — ECHOCARDIOGRAM LIMITED: S' Lateral: 2.98 cm

## 2021-05-05 NOTE — Progress Notes (Signed)
*  PRELIMINARY RESULTS* Echocardiogram 2D Echocardiogram has been performed.  Theresa Chaney 05/05/2021, 9:55 AM

## 2021-05-07 ENCOUNTER — Encounter: Payer: Self-pay | Admitting: Oncology

## 2021-05-07 ENCOUNTER — Inpatient Hospital Stay: Payer: Medicare HMO

## 2021-05-07 ENCOUNTER — Inpatient Hospital Stay (HOSPITAL_BASED_OUTPATIENT_CLINIC_OR_DEPARTMENT_OTHER): Payer: Medicare HMO | Admitting: Oncology

## 2021-05-07 ENCOUNTER — Other Ambulatory Visit: Payer: Self-pay

## 2021-05-07 VITALS — BP 115/78 | HR 89 | Resp 16

## 2021-05-07 VITALS — BP 115/74 | HR 98 | Temp 98.0°F | Resp 16 | Wt 190.2 lb

## 2021-05-07 DIAGNOSIS — T451X5A Adverse effect of antineoplastic and immunosuppressive drugs, initial encounter: Secondary | ICD-10-CM

## 2021-05-07 DIAGNOSIS — C50919 Malignant neoplasm of unspecified site of unspecified female breast: Secondary | ICD-10-CM

## 2021-05-07 DIAGNOSIS — Z5111 Encounter for antineoplastic chemotherapy: Secondary | ICD-10-CM

## 2021-05-07 DIAGNOSIS — Z5112 Encounter for antineoplastic immunotherapy: Secondary | ICD-10-CM

## 2021-05-07 DIAGNOSIS — C50811 Malignant neoplasm of overlapping sites of right female breast: Secondary | ICD-10-CM

## 2021-05-07 DIAGNOSIS — C50411 Malignant neoplasm of upper-outer quadrant of right female breast: Secondary | ICD-10-CM

## 2021-05-07 DIAGNOSIS — C7951 Secondary malignant neoplasm of bone: Secondary | ICD-10-CM | POA: Diagnosis not present

## 2021-05-07 DIAGNOSIS — G620 Drug-induced polyneuropathy: Secondary | ICD-10-CM | POA: Diagnosis not present

## 2021-05-07 DIAGNOSIS — Z170 Estrogen receptor positive status [ER+]: Secondary | ICD-10-CM

## 2021-05-07 LAB — CBC WITH DIFFERENTIAL/PLATELET
Abs Immature Granulocytes: 0.02 10*3/uL (ref 0.00–0.07)
Basophils Absolute: 0 10*3/uL (ref 0.0–0.1)
Basophils Relative: 1 %
Eosinophils Absolute: 0.1 10*3/uL (ref 0.0–0.5)
Eosinophils Relative: 1 %
HCT: 32.2 % — ABNORMAL LOW (ref 36.0–46.0)
Hemoglobin: 10.9 g/dL — ABNORMAL LOW (ref 12.0–15.0)
Immature Granulocytes: 0 %
Lymphocytes Relative: 27 %
Lymphs Abs: 2.1 10*3/uL (ref 0.7–4.0)
MCH: 30 pg (ref 26.0–34.0)
MCHC: 33.9 g/dL (ref 30.0–36.0)
MCV: 88.7 fL (ref 80.0–100.0)
Monocytes Absolute: 0.8 10*3/uL (ref 0.1–1.0)
Monocytes Relative: 10 %
Neutro Abs: 4.9 10*3/uL (ref 1.7–7.7)
Neutrophils Relative %: 61 %
Platelets: 269 10*3/uL (ref 150–400)
RBC: 3.63 MIL/uL — ABNORMAL LOW (ref 3.87–5.11)
RDW: 13 % (ref 11.5–15.5)
WBC: 8 10*3/uL (ref 4.0–10.5)
nRBC: 0 % (ref 0.0–0.2)

## 2021-05-07 LAB — COMPREHENSIVE METABOLIC PANEL
ALT: 18 U/L (ref 0–44)
AST: 27 U/L (ref 15–41)
Albumin: 4.2 g/dL (ref 3.5–5.0)
Alkaline Phosphatase: 79 U/L (ref 38–126)
Anion gap: 11 (ref 5–15)
BUN: 16 mg/dL (ref 6–20)
CO2: 24 mmol/L (ref 22–32)
Calcium: 9.6 mg/dL (ref 8.9–10.3)
Chloride: 103 mmol/L (ref 98–111)
Creatinine, Ser: 0.81 mg/dL (ref 0.44–1.00)
GFR, Estimated: >60 mL/min (ref >60)
Glucose, Bld: 117 mg/dL — ABNORMAL HIGH (ref 70–99)
Potassium: 3.2 mmol/L — ABNORMAL LOW (ref 3.5–5.1)
Sodium: 138 mmol/L (ref 135–145)
Total Bilirubin: 0.6 mg/dL (ref 0.3–1.2)
Total Protein: 7.1 g/dL (ref 6.5–8.1)

## 2021-05-07 LAB — MAGNESIUM: Magnesium: 1.7 mg/dL (ref 1.7–2.4)

## 2021-05-07 MED ORDER — SODIUM CHLORIDE 0.9 % IV SOLN
Freq: Once | INTRAVENOUS | Status: AC
  Filled 2021-05-07: qty 250

## 2021-05-07 MED ORDER — TRASTUZUMAB-ANNS CHEMO 150 MG IV SOLR
6.0000 mg/kg | Freq: Once | INTRAVENOUS | Status: AC
  Administered 2021-05-07: 504 mg via INTRAVENOUS
  Filled 2021-05-07: qty 24

## 2021-05-07 MED ORDER — SODIUM CHLORIDE 0.9 % IV SOLN
420.0000 mg | Freq: Once | INTRAVENOUS | Status: AC
  Administered 2021-05-07: 420 mg via INTRAVENOUS
  Filled 2021-05-07: qty 14

## 2021-05-07 MED ORDER — ACETAMINOPHEN 325 MG PO TABS
650.0000 mg | ORAL_TABLET | Freq: Once | ORAL | Status: AC
  Administered 2021-05-07: 650 mg via ORAL
  Filled 2021-05-07: qty 2

## 2021-05-07 MED ORDER — POTASSIUM CHLORIDE 20 MEQ/100ML IV SOLN
20.0000 meq | Freq: Once | INTRAVENOUS | Status: AC
  Administered 2021-05-07: 20 meq via INTRAVENOUS

## 2021-05-07 MED ORDER — DIPHENHYDRAMINE HCL 25 MG PO CAPS
50.0000 mg | ORAL_CAPSULE | Freq: Once | ORAL | Status: AC
  Administered 2021-05-07: 50 mg via ORAL
  Filled 2021-05-07: qty 2

## 2021-05-07 MED ORDER — HEPARIN SOD (PORK) LOCK FLUSH 100 UNIT/ML IV SOLN
500.0000 [IU] | Freq: Once | INTRAVENOUS | Status: AC | PRN
  Administered 2021-05-07: 500 [IU]
  Filled 2021-05-07: qty 5

## 2021-05-07 NOTE — Progress Notes (Signed)
Patient declined 30 minute post observation. Patient and VSS. Discharged home.

## 2021-05-07 NOTE — Progress Notes (Signed)
Patient would like to discuss checking magnesium level today due to muscle cramps.

## 2021-05-07 NOTE — Progress Notes (Signed)
Hematology/Oncology Follow Up Note [facility] [phone] [phone]  Patient Care Team: Marguerita Merles, MD as PCP - General (Family Medicine) End, Harrell Gave, MD as PCP - Cardiology (Cardiology) Rico Junker, RN as Oncology Nurse Navigator Earlie Server, MD as Consulting Physician (Oncology) Bary Castilla, Forest Gleason, MD (General Surgery) Noreene Filbert, MD as Referring Physician (Radiation Oncology) Noreene Filbert, MD as Radiation Oncologist (Radiation Oncology) Noreene Filbert, MD as Radiation Oncologist (Radiation Oncology)   Name of the patient: Theresa Chaney  [id number]  1981/11/10   Date of visit: 05/07/21 REASON FOR VISIT Follow up for Assessment prior to chemotherapy treatment of breast cancer  Oncology History Cancer TREATMENT Neoadjuvant ddAC +one dose of Taxol, due to lack of response, surgery was offered. Case was discussed on breast tumor board. 03/19/2018 S/p right mastectomy and right axillary dissection, immediate breast reconstruction with placement of expanders.  ypT3 ypN2, + lymphovascular invasion,  Grade 3, margin is negative, close. ER 90%, PR 0%, HER2 IHC negative.  Also had elective bilateral salpingo-oophorectomy..  # 06/11/2018 s/p 11 cycles Taxol adjuvantly. Tolerated well.  # She has obtained dental clearance for starting Zometa.  S/p Zometa on 6/3/ 2019 # s/p adjuvant right chest wall radiation, finished 10/10/2018 #06/03/2019 underwent elective left prophylactic mastectomy and sentinel lymph node biopsy of left axilla.  # 06/03/2019 underwent elective left prophylactic mastectomy and sentinel lymph node biopsy of left axilla. Pathology negative for malignancy. Status post Mediport removal on 06/03/2019. She also underwent right implant removal on 06/03/2019. She also underwent right implant removal on 06/03/2019. #Negative genetic testing  #  chemotherapy-induced neuropathy, bilateral fingertips and lower extremities.   Patient is on Lyrica and nortriptyline.  Follows up with neurology. She follows up with lymphedema clinic.  #  07/07/2020, MRI thoracic spine without contrast showed lesions involving the T7 posterior elements most concerning for metastatic lesion.  No evidence of epidural tumor.  Minimal thoracic spondylosis without stenosis. MRI was reviewed by me and a PET scan was obtained for further evaluation. 07/20/2020, PET scan showed hypermetabolic metastasis involving the posterior element of T7, no additional evidence of metastasis in the neck, chest, abdomen or pelvis.  # 07/29/2020 T7 lesion biopsy showed metastatic carcinoma, compatible with breast origin.  Receptor status staining showed ER 71-80% positive, PR negative, HER-2 positive IHC 3+ # Patient finished spine radiation on 08/31/2020   positive for COVID-19 on 01/11/2021  # 03/15/2021, PET scan showed no focal hypermetabolic activity to suggest skeletal metastasis.  Mild hypermetabolic activity along the right T7-8 paraspinal musculature.  Max SUV 3.3.  Likely postprocedural-    INTERVAL HISTORY 40 yo female with above oncology history reviewed by me presents for follow-up of management of metastatic breast cancer.  Chronic intermittent back pain. Ongoing hot flash No nausea vomiting. Lower extremity cramps    Review of Systems  Constitutional: Negative for chills, fever, malaise/fatigue and weight loss.  HENT: Negative for sore throat.   Eyes: Negative for redness.  Respiratory: Negative for cough, shortness of breath and wheezing.   Cardiovascular: Negative for chest pain, palpitations and leg swelling.  Gastrointestinal: Positive for nausea. Negative for abdominal pain, blood in stool, heartburn and vomiting.  Genitourinary: Negative for dysuria.  Musculoskeletal: Positive for back pain and joint pain. Negative for myalgias.  Skin: Negative for rash.  Neurological: Positive for tingling. Negative for dizziness and tremors.   Endo/Heme/Allergies: Does not bruise/bleed easily.  Psychiatric/Behavioral: Negative for hallucinations.    No Known Allergies  Patient Active  Problem List   Diagnosis Date Noted  . Tachycardia, unspecified 04/15/2021  . Bone metastasis ([place]) 03/25/2021  . Encounter for monoclonal antibody treatment for malignancy 12/10/2020  . Bone lesion 11/19/2020  . Hypocalcemia 11/19/2020  . Inflammatory arthritis 11/19/2020  . Encounter for antineoplastic chemotherapy 10/29/2020  . Metastatic breast cancer ([place]) 08/31/2020  . Goals of care, counseling/discussion 08/11/2020  . Neuropathy due to chemotherapeutic drug ([place]) 08/11/2020  . HER2-positive carcinoma of breast ([place]) 08/11/2020  . Rheumatoid arteritis ([place]) 10/13/2019  . Bilateral hand swelling 10/03/2019  . Fracture of neck of metacarpal bone 05/14/2019  . Chronic fatigue 04/09/2019  . Polyarthralgia 04/09/2019  . Status post right breast reconstruction 02/26/2019  . Status post right mastectomy 02/26/2019  . Mastalgia 02/15/2019  . Shortness of breath 08/23/2018  . Nonischemic cardiomyopathy ([place]) 08/23/2018  . Preprocedural cardiovascular examination 08/23/2018  . Tachycardia 08/23/2018  . Palpitations 08/23/2018  . Estrogen receptor positive status (ER+) 04/04/2018  . Acquired absence of right breast and nipple 04/03/2018  . Breast cancer of upper-outer quadrant of right female breast ([place]) 03/19/2018  . Family history of cancer   . Malignant neoplasm of overlapping sites of right breast in female, estrogen receptor positive ([place]) 10/19/2017  . Gastroesophageal reflux disease without esophagitis 02/24/2017  . Generalized anxiety disorder 10/03/2014  . Headache 10/03/2014     Past Medical History:  Diagnosis Date  . Anemia   . Arthritis   . BRCA negative 11/26/2017  . Breast cancer ([place]) 10/11/2017   Multifocal, ER positive, PR negative, HER-2 negative. ypT3 ypN2a 8.7 cm, 4/15 nodes  . Cardiomyopathy ([employer])    a.  10/2017 Echo: EF 60-65%, no rwma, Gr1 DD, nl RV size/fxn; b. 04/2018 Echo: EF 55-60%, no rwma, Nl RV size/fxn; c. 08/2018 Echo: EF 45%, diff HK, ? HK of antsept wall. Gr1 DD. Mild MR. Mild LAE/Theresa. Mod dil RV.   [name] Chronic bronchitis ([place]) 11/2017  . COPD (chronic obstructive pulmonary disease) ([place])    MILD PER CXR  . Depression   . Family history of cancer   . GERD (gastroesophageal reflux disease)   . Headache    MIGRAINES  . Heart murmur    ASYMPTOMATIC  . Personal history of chemotherapy    current for right breast ca     Past Surgical History:  Procedure Laterality Date  . AXILLARY LYMPH NODE DISSECTION Right 03/19/2018   Procedure: AXILLARY LYMPH NODE DISSECTION;  Surgeon: Robert Bellow, MD;  Location: [facility];  Service: General;  Laterality: Right;  . BREAST BIOPSY Right 10/11/2017   12:30 posterior coil clip invasive mammary carcinoma  . BREAST BIOPSY Right 10/11/2017   11:30 middle depth ribbon clip DCIS  . BREAST BIOPSY Right 10/11/2017   5:30 anterior depth x shape invasive ductal carcinoma  . BREAST IMPLANT REMOVAL Right 06/03/2019   Procedure: REMOVAL OF RIGHT BREAST IMPLANTS;  Surgeon: Wallace Going, DO;  Location: [facility];  Service: Plastics;  Laterality: Right;  . BREAST RECONSTRUCTION WITH PLACEMENT OF TISSUE EXPANDER AND FLEX HD (ACELLULAR HYDRATED DERMIS) Right 03/19/2018   Procedure: BREAST RECONSTRUCTION WITH PLACEMENT OF TISSUE EXPANDER AND FLEX HD (ACELLULAR HYDRATED DERMIS);  Surgeon: Wallace Going, DO;  Location: [facility];  Service: Plastics;  Laterality: Right;  . CARPAL TUNNEL RELEASE Bilateral 2020  . CHOLECYSTECTOMY N/A 04/27/2020   Procedure: LAPAROSCOPIC CHOLECYSTECTOMY WITH INTRAOPERATIVE CHOLANGIOGRAM;  Surgeon: Robert Bellow, MD;  Location: [facility];  Service: General;  Laterality: N/A;  . ESOPHAGOGASTRODUODENOSCOPY (EGD) WITH PROPOFOL  N/A 04/17/2020   Procedure: ESOPHAGOGASTRODUODENOSCOPY (EGD) WITH PROPOFOL;  Surgeon: Robert Bellow, MD;  Location: [facility];  Service: Endoscopy;  Laterality: N/A;  with biopsy  . LAPAROSCOPIC BILATERAL SALPINGO OOPHERECTOMY Bilateral 03/19/2018   Procedure: LAPAROSCOPIC BILATERAL SALPINGO OOPHORECTOMY;  Surgeon: Benjaman Kindler, MD;  Location: [facility];  Service: Gynecology;  Laterality: Bilateral;  . MASTECTOMY Right 03/2018  . MASTECTOMY W/ SENTINEL NODE BIOPSY Right 03/19/2018   Procedure: MASTECTOMY WITH SENTINEL LYMPH NODE BIOPSY;  Surgeon: Robert Bellow, MD;  Location: [facility];  Service: General;  Laterality: Right;  . PORT-A-CATH REMOVAL Left 06/03/2019   Procedure: REMOVAL PORT-A-CATH;  Surgeon: Robert Bellow, MD;  Location: [facility];  Service: General;  Laterality: Left;  . PORTACATH PLACEMENT Left 10/24/2017   Procedure: INSERTION PORT-A-CATH;  Surgeon: Robert Bellow, MD;  Location: [facility];  Service: General;  Laterality: Left;  . PORTACATH PLACEMENT Right 09/28/2020   Procedure: INSERTION PORT-A-CATH;  Surgeon: Robert Bellow, MD;  Location: [facility];  Service: General;  Laterality: Right;  . REMOVAL OF TISSUE EXPANDER AND PLACEMENT OF IMPLANT Right 07/20/2018   Procedure: REMOVAL OF RIGHT BREAST TISSUE EXPANDER AND PLACEMENT OF IMPLANT;  Surgeon: Wallace Going, DO;  Location: [place] [facility];  Service: Plastics;  Laterality: Right;  . SIMPLE MASTECTOMY WITH AXILLARY SENTINEL NODE BIOPSY Left 06/03/2019   Procedure: SIMPLE MASTECTOMY LEFT;  Surgeon: Robert Bellow, MD;  Location: [facility];  Service: General;  Laterality: Left;    Social History   Socioeconomic History  . Marital status: Married    Spouse name: Not on file  . Number of children: Not on file  . Years of education: Not on file  . Highest education level: Not on file  Occupational History  . Occupation: Occupational psychologist    Comment: Therapist, art   Tobacco Use  . Smoking status: Current Every Day Smoker    Packs/day: 0.50    Years:  18.00    Pack years: 9.00    Types: Cigarettes    Start date: 06/21/2018  . Smokeless tobacco: Never Used  Vaping Use  . Vaping Use: Never used  Substance and Sexual Activity  . Alcohol use: No  . Drug use: No  . Sexual activity: Yes    Birth control/protection: Injection, Other-see comments    Comment: has had hysterectomy  Other Topics Concern  . Not on file  Social History Narrative   Lives at home with husband and daughter   Social Determinants of Health   Financial Resource Strain: Not on file  Food Insecurity: Not on file  Transportation Needs: Not on file  Physical Activity: Not on file  Stress: Not on file  Social Connections: Not on file  Intimate Partner Violence: Not on file     Family History  Problem Relation Age of Onset  . Melanoma Maternal Aunt        other aunts with BCC/SCC/Melanoma  . Diabetes Father   . Hypertension Father   . Hyperlipidemia Father   . Heart attack Father 35       "mild"  . Bladder Cancer Maternal Grandmother   . Cervical cancer Maternal Aunt 64       daughter w/ cervical cancer as well  . Melanoma Maternal Uncle        other uncles with BCC/SCC/Melanoma     Current Outpatient Medications:  .  acetaminophen (TYLENOL) 500 MG tablet, Take 500 mg by mouth every 6 (  six) hours as needed., Disp: , Rfl:  .  albuterol (VENTOLIN HFA) 108 (90 Base) MCG/ACT inhaler, Inhale 2 puffs into the lungs every 6 (six) hours as needed for wheezing or shortness of breath. , Disp: , Rfl:  .  Calcium Carbonate-Vitamin D3 (CALCIUM 600/VITAMIN D) 600-400 MG-UNIT TABS, Take 2 tablets by mouth daily. (Patient taking differently: Take 3 tablets by mouth daily.), Disp: 180 tablet, Rfl: 0 .  cholecalciferol (VITAMIN D3) 25 MCG (1000 UNIT) tablet, Take 1 tablet (1,000 Units total) by mouth daily., Disp: 90 tablet, Rfl: 0 .  cyanocobalamin (,VITAMIN B-12,) 1000 MCG/ML injection, Inject 1,000 mcg into the skin every 30 (thirty) days., Disp: , Rfl:  .   cyclobenzaprine (FLEXERIL) 5 MG tablet, Take 5 mg by mouth 3 (three) times daily as needed for muscle spasms. , Disp: , Rfl:  .  escitalopram (LEXAPRO) 20 MG tablet, Take 20 mg by mouth daily. , Disp: , Rfl:  .  esomeprazole (NEXIUM) 40 MG capsule, Take 40 mg by mouth daily before breakfast. , Disp: , Rfl:  .  Eszopiclone 3 MG TABS, Take 3 mg by mouth at bedtime as needed (sleep). , Disp: , Rfl:  .  folic acid (FOLVITE) 1 MG tablet, Take 1 mg by mouth daily., Disp: , Rfl:  .  lidocaine-prilocaine (EMLA) cream, Apply to port and cover 1-2 hours prior to appointment, Disp: 30 g, Rfl: 3 .  loperamide (IMODIUM) 2 MG capsule, Take 2 tablets with onset of diarrhea, then take 1 tablet every 2 hours until diarrhea stops. Maximum 8 tablets in 24hours, Disp: 30 capsule, Rfl: 3 .  loratadine (CLARITIN) 10 MG tablet, Take 10 mg by mouth daily. , Disp: , Rfl:  .  LORazepam (ATIVAN) 1 MG tablet, Take 1 mg by mouth 3 (three) times daily., Disp: , Rfl:  .  Magnesium 400 MG TABS, Take 400 mg by mouth 2 (two) times daily., Disp: , Rfl:  .  meloxicam (MOBIC) 7.5 MG tablet, Take 7.5 mg by mouth 2 (two) times daily as needed (pain/inflammation.). , Disp: , Rfl:  .  methotrexate (RHEUMATREX) 2.5 MG tablet, Take 25 mg by mouth every Sunday. 10 tablets once a week, Disp: , Rfl:  .  metoprolol succinate (TOPROL-XL) 25 MG 24 hr tablet, Take 37.5 mg by mouth every morning. , Disp: , Rfl:  .  mupirocin nasal ointment (BACTROBAN) 2 %, Place 1 application into the nose 2 (two) times daily. Use one-half of tube in each nostril twice daily for five (5) days. After application, press sides of nose together and gently massage., Disp: 10 g, Rfl: 0 .  nortriptyline (PAMELOR) 10 MG capsule, Take 30 mg by mouth at bedtime. , Disp: , Rfl:  .  oxyCODONE (OXY IR/ROXICODONE) 5 MG immediate release tablet, Take 1 tablet (5 mg total) by mouth every 6 (six) hours as needed for moderate pain or severe pain., Disp: 60 tablet, Rfl: 0 .   phentermine (ADIPEX-P) 37.5 MG tablet, Take 37.5 mg by mouth daily before breakfast. , Disp: , Rfl:  .  pregabalin (LYRICA) 150 MG capsule, Take 150 mg by mouth 2 (two) times daily., Disp: , Rfl:  .  promethazine (PHENERGAN) 25 MG tablet, Take 1 tablet (25 mg total) by mouth every 8 (eight) hours as needed for nausea or vomiting., Disp: 90 tablet, Rfl: 0 .  pyridOXINE (VITAMIN B-6) 100 MG tablet, Take 100 mg by mouth daily., Disp: , Rfl:  .  topiramate (TOPAMAX) 50 MG tablet, Take 50  mg by mouth 2 (two) times daily. , Disp: , Rfl:  .  vitamin C (ASCORBIC ACID) 500 MG tablet, Take 500 mg by mouth daily., Disp: , Rfl:  .  diphenoxylate-atropine (LOMOTIL) 2.5-0.025 MG tablet, Take 1 tablet by mouth 4 (four) times daily as needed for diarrhea or loose stools. (Patient not taking: No sig reported), Disp: 60 tablet, Rfl: 0 .  ibuprofen (ADVIL) 800 MG tablet, Take 800 mg by mouth every 8 (eight) hours as needed for moderate pain.  (Patient not taking: No sig reported), Disp: , Rfl:  No current facility-administered medications for this visit.  Facility-Administered Medications Ordered in Other Visits:  .  heparin lock flush 100 unit/mL, 500 Units, Intravenous, Once, Earlie Server, MD .  sodium chloride flush (NS) 0.9 % injection 10 mL, 10 mL, Intravenous, PRN, Earlie Server, MD, 10 mL at 11/19/18 1249 .  sodium chloride flush (NS) 0.9 % injection 10 mL, 10 mL, Intravenous, Once, Earlie Server, MD   Physical exam:  Vitals:   05/07/21 0848  BP: 115/74  Pulse: 98  Resp: 16  Temp: 98 F (36.7 C)  Weight: 190 lb 3.2 oz (86.3 kg)  ECOG 1 Physical Exam Constitutional:      General: She is not in acute distress.    Appearance: She is not diaphoretic.  HENT:     Head: Normocephalic and atraumatic.     Nose: Nose normal.     Mouth/Throat:     Pharynx: No oropharyngeal exudate.  Eyes:     General: No scleral icterus.       Left eye: No discharge.     Conjunctiva/sclera: Conjunctivae normal.     Pupils: Pupils  are equal, round, and reactive to light.  Neck:     Vascular: No JVD.  Cardiovascular:     Rate and Rhythm: Regular rhythm. Tachycardia present.     Heart sounds: Normal heart sounds. No murmur heard.   Pulmonary:     Effort: Pulmonary effort is normal. No respiratory distress.     Breath sounds: Normal breath sounds. No wheezing or rales.  Chest:     Chest wall: No tenderness.  Abdominal:     General: Bowel sounds are normal. There is no distension.     Palpations: Abdomen is soft. There is no mass.     Tenderness: There is no abdominal tenderness. There is no rebound.  Musculoskeletal:        General: Normal range of motion.     Cervical back: Normal range of motion and neck supple.  Lymphadenopathy:     Cervical: No cervical adenopathy.  Skin:    General: Skin is warm and dry.     Findings: No erythema or rash.  Neurological:     Mental Status: She is alert and oriented to person, place, and time.     Cranial Nerves: No cranial nerve deficit.     Motor: No abnormal muscle tone.     Coordination: Coordination normal.  Psychiatric:        Mood and Affect: Affect normal.           CMP Latest Ref Rng & Units 05/07/2021  Glucose 70 - 99 mg/dL 117(H)  BUN 6 - 20 mg/dL 16  Creatinine 0.44 - 1.00 mg/dL 0.81  Sodium 135 - 145 mmol/L 138  Potassium 3.5 - 5.1 mmol/L 3.2(L)  Chloride 98 - 111 mmol/L 103  CO2 22 - 32 mmol/L 24  Calcium 8.9 - 10.3 mg/dL 9.6  Total  Protein 6.5 - 8.1 g/dL 7.1  Total Bilirubin 0.3 - 1.2 mg/dL 0.6  Alkaline Phos 38 - 126 U/L 79  AST 15 - 41 U/L 27  ALT 0 - 44 U/L 18   CBC Latest Ref Rng & Units 05/07/2021  WBC 4.0 - 10.5 K/uL 8.0  Hemoglobin 12.0 - 15.0 g/dL 10.9(L)  Hematocrit 36.0 - 46.0 % 32.2(L)  Platelets 150 - 400 K/uL 269   RADIOGRAPHIC STUDIES: I have personally reviewed the radiological images as listed and agreed with the findings in the report. NM PET Image Restag (PS) Skull Base To Thigh  Result Date: 03/16/2021 CLINICAL  DATA:  Subsequent treatment strategy for metastatic breast cancer. EXAM: NUCLEAR MEDICINE PET SKULL BASE TO THIGH TECHNIQUE: 10.9 mCi F-18 FDG was injected intravenously. Full-ring PET imaging was performed from the skull base to thigh after the radiotracer. CT data was obtained and used for attenuation correction and anatomic localization. Fasting blood glucose: 108 mg/dl COMPARISON:  PET-CT dated 12/03/2020 FINDINGS: Mediastinal blood pool activity: SUV max 2.0 Liver activity: SUV max NA NECK: No hypermetabolic cervical lymphadenopathy. Incidental CT findings: none CHEST: No suspicious pulmonary nodules. Status post bilateral mastectomy. No metabolic thoracic lymphadenopathy. Status post right axillary lymph node dissection. Right chest port terminates the cavoatrial junction. Incidental CT findings: none ABDOMEN/PELVIS: No abnormal hypermetabolism in the liver, spleen, pancreas, or adrenal glands. No hypermetabolic abdominopelvic lymphadenopathy. Incidental CT findings: Status post cholecystectomy. SKELETON: No focal hypermetabolic activity to suggest skeletal metastasis. Mild hypermetabolism along the right T7-8 paraspinal musculature, max SUV 3.3, likely postprocedural. Incidental CT findings: none IMPRESSION: Status post bilateral mastectomy and right axillary lymph node dissection. Mild residual post procedure hypermetabolism involving the right T7-8 paraspinal musculature. No findings suspicious for recurrent or metastatic disease. Electronically Signed   By: Julian Hy M.D.   On: 03/16/2021 09:11   ECHOCARDIOGRAM LIMITED  Result Date: 05/05/2021    ECHOCARDIOGRAM LIMITED REPORT   Patient Name:   IMOGENE GRAVELLE Date of Exam: 05/05/2021 Medical Rec #:  [id number]      Height:       67.0 in Accession #:    [id number]     Weight:       192.1 lb Date of Birth:  04-24-1981      BSA:          1.988 m Patient Age:    81 years       BP:           116/72 mmHg Patient Gender: F              HR:           102  bpm. Exam Location:  [facility] Procedure: Limited Echo, Cardiac Doppler, Color Doppler and Strain Analysis Indications:     R00.0 Tachycardia-unspecified.  History:         Patient has prior history of Echocardiogram examinations, most                  recent 01/29/2021. Cardiomyopathy, COPD; Signs/Symptoms:Murmur.                  Personal history of chemotherapy.  Sonographer:     Sherrie Sport RDCS (AE) Referring Phys:  [id number] Shahira Fiske Diagnosing Phys: Kate Sable MD  Sonographer Comments: Global longitudinal strain was attempted. IMPRESSIONS  1. Left ventricular ejection fraction, by estimation, is 55 to 60%. The left ventricle has normal function. The left ventricle has no regional wall motion abnormalities.  2. Right  ventricular systolic function is normal. The right ventricular size is normal.  3. The mitral valve is normal in structure. No evidence of mitral valve regurgitation.  4. The aortic valve is tricuspid. Aortic valve regurgitation is not visualized. FINDINGS  Left Ventricle: Left ventricular ejection fraction, by estimation, is 55 to 60%. The left ventricle has normal function. The left ventricle has no regional wall motion abnormalities. Global longitudinal strain performed but not reported based on interpreter judgement due to suboptimal tracking. 3D left ventricular ejection fraction analysis performed but not reported based on interpreter judgement due to suboptimal quality. The left ventricular internal cavity size was normal in size. There is no left ventricular hypertrophy. Right Ventricle: The right ventricular size is normal. No increase in right ventricular wall thickness. Right ventricular systolic function is normal. Left Atrium: Left atrial size was normal in size. Right Atrium: Right atrial size was normal in size. Mitral Valve: The mitral valve is normal in structure. Tricuspid Valve: The tricuspid valve is normal in structure. Tricuspid valve regurgitation is trivial. Aortic Valve: The  aortic valve is tricuspid. Aortic valve regurgitation is not visualized. Aorta: The aortic root is normal in size and structure. IAS/Shunts: No atrial level shunt detected by color flow Doppler. LEFT VENTRICLE PLAX 2D LVIDd:         4.23 cm LVIDs:         2.98 cm LV PW:         1.09 cm LV IVS:        0.87 cm LVOT diam:     2.00 cm  3D Volume EF: LV SV:         45       3D EF:        53 % LV SV Index:   23       LV EDV:       122 ml LVOT Area:     3.14 cm LV ESV:       57 ml                         LV SV:        65 ml LEFT ATRIUM             Index       RIGHT ATRIUM           Index LA diam:        3.20 cm 1.61 cm/m  RA Area:     12.40 cm LA Vol (A2C):   35.7 ml 17.96 ml/m RA Volume:   26.50 ml  13.33 ml/m LA Vol (A4C):   30.2 ml 15.19 ml/m LA Biplane Vol: 33.2 ml 16.70 ml/m  AORTIC VALVE             PULMONIC VALVE LVOT Vmax:   73.80 cm/s  PV Vmax:        0.69 m/s LVOT Vmean:  53.300 cm/s PV Peak grad:   1.9 mmHg LVOT VTI:    0.144 m     RVOT Peak grad: 2 mmHg  AORTA Ao Root diam: 2.80 cm  SHUNTS Systemic VTI:  0.14 m Systemic Diam: 2.00 cm Kate Sable MD Electronically signed by Kate Sable MD Signature Date/Time: 05/05/2021/1:18:12 PM    Final         Assessment and plan-  Cancer Staging Breast cancer of upper-outer quadrant of right female breast [facility]) Staging form: Breast, AJCC 8th Edition - Clinical: G2, ER+, PR+,  HER2- - Signed by Earlie Server, MD on 04/05/2018 Histologic grading system: 3 grade system - Pathologic stage from 04/04/2018: No Stage Recommended (ypT3, pN2, cM0, G3, ER+, PR-, HER2-) - Signed by Earlie Server, MD on 04/04/2018 Stage prefix: Post-therapy Neoadjuvant therapy: Yes Response to neoadjuvant therapy: No response Method of lymph node assessment: Axillary lymph node dissection Multigene prognostic tests performed: None Histologic grading system: 3 grade system Laterality: Right - Pathologic: Stage IV (rpTX, pNX, cM1, ER+, PR-, HER2+) - Signed by Earlie Server, MD on  12/10/2020 Stage prefix: Recurrence    1. Encounter for monoclonal antibody treatment for malignancy   2. Metastatic breast cancer ([place])   3. HER2-positive carcinoma of breast ([place])   4. Neuropathy due to chemotherapeutic drug ([place])   5. Bone metastasis ([place])   6. Hypocalcemia   7. Encounter for antineoplastic chemotherapy    Recurrent breast cancer with isolated bone metastasis, stage IV #Initially stage IIIA right breast cancer, ER positive, HER-2 negative status post bilateral oophorectomy, Right mastectomy and right axillary lymph node dissection, status post implant, and implant removal.  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 Estrogen positive, HER-2 positive breast cancer. Palliative radiation to T7  Labs are reviewed and discussed with patient. Proceed with transtuzumab and pertuzumab today.  Fulvestrant monthly.   #Anemia, mild.  Likely secondary to methotrexate.  Monitor. #Tachycardia, chronic intermittent issue for her.  Heart rate is normal today at 98 01/29/21 2D echocardiogram LVEF 55%-60%. Check EKG.  EKG showed HR of 102, sinus tachycardia. Recommend patient to repeat Echo, and follow up with Dr.End- last seen -2019.  She is on metoprolol.   # Hypokalemia, she reports oral potassium causes her to have cramps.  Proceed with IV Kcl 94mq over 1 hour.  #Treatment induced diarrhea,Continue Lomotil as needed.  Symptoms are stable. #Bone metastasis, status post radiation. Continue Zometa monthly.  #Back pain, monitor symptoms.  continue oxycodone 5 mg daily 6 hours as needed #Chemotherapy-induced neuropathy, on Lyrica and nortriptyline, continue.  #Depression.  Continue Lexapro 20 mg daily. #Rheumatoid arthritis,   Follow up with  rheumatology.  Patient is on methotrexate. # Hot flash due to postmenopausal state, already on Lexapro. Can not combine with Effexor.    Follow-up in 3 weeks  for the next cycle of trastuzumab and Pertuzumab treatments.  Patient knows to call clinic if she experiences concerning side effects  ZEarlie Server MD, PhD Hematology Oncology [facility]05/27/22

## 2021-05-07 NOTE — Patient Instructions (Signed)
[facility] ONCOLOGY  Discharge Instructions: Thank you for choosing [facility] to provide your oncology and hematology care.  If you have a lab appointment with the [facility], please go directly to the [facility] and check in at the registration area.  Wear comfortable clothing and clothing appropriate for easy access to any Portacath or PICC line.   We strive to give you quality time with your provider. You may need to reschedule your appointment if you arrive late (15 or more minutes).  Arriving late affects you and other patients whose appointments are after yours.  Also, if you miss three or more appointments without notifying the office, you may be dismissed from the clinic at the provider's discretion.      For prescription refill requests, have your pharmacy contact our office and allow 72 hours for refills to be completed.    Today you received the following chemotherapy and/or immunotherapy agents Kanjinti & PerjetaPertuzumab injection What is this medicine? PERTUZUMAB (per TOOZ ue mab) is a monoclonal antibody. It is used to treat breast cancer. This medicine may be used for other purposes; ask your health care provider or pharmacist if you have questions. COMMON BRAND NAME(S): PERJETA What should I tell my health care provider before I take this medicine? They need to know if you have any of these conditions:  heart disease  heart failure  high blood pressure  history of irregular heart beat  recent or ongoing radiation therapy  an unusual or allergic reaction to pertuzumab, other medicines, foods, dyes, or preservatives  pregnant or trying to get pregnant  breast-feeding How should I use this medicine? This medicine is for infusion into a vein. It is given by a health care professional in a hospital or clinic setting. Talk to your pediatrician regarding the use of this medicine in children. Special care may be  needed. Overdosage: If you think you have taken too much of this medicine contact a poison control center or emergency room at once. NOTE: This medicine is only for you. Do not share this medicine with others. What if I miss a dose? It is important not to miss your dose. Call your doctor or health care professional if you are unable to keep an appointment. What may interact with this medicine? Interactions are not expected. Give your health care provider a list of all the medicines, herbs, non-prescription drugs, or dietary supplements you use. Also tell them if you smoke, drink alcohol, or use illegal drugs. Some items may interact with your medicine. This list may not describe all possible interactions. Give your health care provider a list of all the medicines, herbs, non-prescription drugs, or dietary supplements you use. Also tell them if you smoke, drink alcohol, or use illegal drugs. Some items may interact with your medicine. What should I watch for while using this medicine? Your condition will be monitored carefully while you are receiving this medicine. Report any side effects. Continue your course of treatment even though you feel ill unless your doctor tells you to stop. Do not become pregnant while taking this medicine or for 7 months after stopping it. Women should inform their doctor if they wish to become pregnant or think they might be pregnant. Women of child-bearing potential will need to have a negative pregnancy test before starting this medicine. There is a potential for serious side effects to an unborn child. Talk to your health care professional or pharmacist for more information. Do  not breast-feed an infant while taking this medicine or for 7 months after stopping it. Women must use effective birth control with this medicine. Call your doctor or health care professional for advice if you get a fever, chills or sore throat, or other symptoms of a cold or flu. Do not treat  yourself. Try to avoid being around people who are sick. You may experience fever, chills, and headache during the infusion. Report any side effects during the infusion to your health care professional. What side effects may I notice from receiving this medicine? Side effects that you should report to your doctor or health care professional as soon as possible:  breathing problems  chest pain or palpitations  dizziness  feeling faint or lightheaded  fever or chills  skin rash, itching or hives  sore throat  swelling of the face, lips, or tongue  swelling of the legs or ankles  unusually weak or tired Side effects that usually do not require medical attention (report to your doctor or health care professional if they continue or are bothersome):  diarrhea  hair loss  nausea, vomiting  tiredness This list may not describe all possible side effects. Call your doctor for medical advice about side effects. You may report side effects to FDA at 1-800-FDA-1088. Where should I keep my medicine? This drug is given in a hospital or clinic and will not be stored at home. NOTE: This sheet is a summary. It may not cover all possible information. If you have questions about this medicine, talk to your doctor, pharmacist, or health care provider.  2021 Elsevier/Gold Standard (2015-12-31 12:08:50) Trastuzumab injection for infusion What is this medicine? TRASTUZUMAB (tras TOO zoo mab) is a monoclonal antibody. It is used to treat breast cancer and stomach cancer. This medicine may be used for other purposes; ask your health care provider or pharmacist if you have questions. COMMON BRAND NAME(S): Herceptin, Galvin Proffer, Trazimera What should I tell my health care provider before I take this medicine? They need to know if you have any of these conditions:  heart disease  heart failure  lung or breathing disease, like asthma  an unusual or allergic reaction  to trastuzumab, benzyl alcohol, or other medications, foods, dyes, or preservatives  pregnant or trying to get pregnant  breast-feeding How should I use this medicine? This drug is given as an infusion into a vein. It is administered in a hospital or clinic by a specially trained health care professional. Talk to your pediatrician regarding the use of this medicine in children. This medicine is not approved for use in children. Overdosage: If you think you have taken too much of this medicine contact a poison control center or emergency room at once. NOTE: This medicine is only for you. Do not share this medicine with others. What if I miss a dose? It is important not to miss a dose. Call your doctor or health care professional if you are unable to keep an appointment. What may interact with this medicine? This medicine may interact with the following medications:  certain types of chemotherapy, such as daunorubicin, doxorubicin, epirubicin, and idarubicin This list may not describe all possible interactions. Give your health care provider a list of all the medicines, herbs, non-prescription drugs, or dietary supplements you use. Also tell them if you smoke, drink alcohol, or use illegal drugs. Some items may interact with your medicine. What should I watch for while using this medicine? Visit your doctor for checks  on your progress. Report any side effects. Continue your course of treatment even though you feel ill unless your doctor tells you to stop. Call your doctor or health care professional for advice if you get a fever, chills or sore throat, or other symptoms of a cold or flu. Do not treat yourself. Try to avoid being around people who are sick. You may experience fever, chills and shaking during your first infusion. These effects are usually mild and can be treated with other medicines. Report any side effects during the infusion to your health care professional. Fever and chills  usually do not happen with later infusions. Do not become pregnant while taking this medicine or for 7 months after stopping it. Women should inform their doctor if they wish to become pregnant or think they might be pregnant. Women of child-bearing potential will need to have a negative pregnancy test before starting this medicine. There is a potential for serious side effects to an unborn child. Talk to your health care professional or pharmacist for more information. Do not breast-feed an infant while taking this medicine or for 7 months after stopping it. Women must use effective birth control with this medicine. What side effects may I notice from receiving this medicine? Side effects that you should report to your doctor or health care professional as soon as possible:  allergic reactions like skin rash, itching or hives, swelling of the face, lips, or tongue  chest pain or palpitations  cough  dizziness  feeling faint or lightheaded, falls  fever  general ill feeling or flu-like symptoms  signs of worsening heart failure like breathing problems; swelling in your legs and feet  unusually weak or tired Side effects that usually do not require medical attention (report to your doctor or health care professional if they continue or are bothersome):  bone pain  changes in taste  diarrhea  joint pain  nausea/vomiting  weight loss This list may not describe all possible side effects. Call your doctor for medical advice about side effects. You may report side effects to FDA at 1-800-FDA-1088. Where should I keep my medicine? This drug is given in a hospital or clinic and will not be stored at home. NOTE: This sheet is a summary. It may not cover all possible information. If you have questions about this medicine, talk to your doctor, pharmacist, or health care provider.  2021 Elsevier/Gold Standard (2016-11-22 14:37:52)       To help prevent nausea and vomiting after your  treatment, we encourage you to take your nausea medication as directed.  BELOW ARE SYMPTOMS THAT SHOULD BE REPORTED IMMEDIATELY: . *FEVER GREATER THAN 100.4 F (38 C) OR HIGHER . *CHILLS OR SWEATING . *NAUSEA AND VOMITING THAT IS NOT CONTROLLED WITH YOUR NAUSEA MEDICATION . *UNUSUAL SHORTNESS OF BREATH . *UNUSUAL BRUISING OR BLEEDING . *URINARY PROBLEMS (pain or burning when urinating, or frequent urination) . *BOWEL PROBLEMS (unusual diarrhea, constipation, pain near the anus) . TENDERNESS IN MOUTH AND THROAT WITH OR WITHOUT PRESENCE OF ULCERS (sore throat, sores in mouth, or a toothache) . UNUSUAL RASH, SWELLING OR PAIN  . UNUSUAL VAGINAL DISCHARGE OR ITCHING   Items with * indicate a potential emergency and should be followed up as soon as possible or go to the Emergency Department if any problems should occur.  Please show the CHEMOTHERAPY ALERT CARD or IMMUNOTHERAPY ALERT CARD at check-in to the Emergency Department and triage nurse.  Should you have questions after your visit or need  to cancel or reschedule your appointment, please contact [facility]  [phone] and follow the prompts.  Office hours are 8:00 a.m. to 4:30 p.m. Monday - Friday. Please note that voicemails left after 4:00 p.m. may not be returned until the following business day.  We are closed weekends and major holidays. You have access to a nurse at all times for urgent questions. Please call the main number to the clinic [phone] and follow the prompts.  For any non-urgent questions, you may also contact your provider using MyChart. We now offer e-Visits for anyone 21 and older to request care online for non-urgent symptoms. For details visit [email or web address].   Also download the MyChart app! Go to the app store, search "MyChart", open the app, select [facility], and log in with your MyChart username and password.  Due to Covid, a mask is required upon entering the  hospital/clinic. If you do not have a mask, one will be given to you upon arrival. For doctor visits, patients may have 1 support person aged 76 or older with them. For treatment visits, patients cannot have anyone with them due to current Covid guidelines and our immunocompromised population.

## 2021-05-13 ENCOUNTER — Other Ambulatory Visit: Payer: Self-pay | Admitting: Oncology

## 2021-05-13 ENCOUNTER — Inpatient Hospital Stay: Payer: Medicare HMO | Attending: Oncology

## 2021-05-13 ENCOUNTER — Inpatient Hospital Stay: Payer: Medicare HMO

## 2021-05-13 DIAGNOSIS — C50411 Malignant neoplasm of upper-outer quadrant of right female breast: Secondary | ICD-10-CM | POA: Diagnosis present

## 2021-05-13 DIAGNOSIS — Z79899 Other long term (current) drug therapy: Secondary | ICD-10-CM | POA: Insufficient documentation

## 2021-05-13 DIAGNOSIS — Z5112 Encounter for antineoplastic immunotherapy: Secondary | ICD-10-CM | POA: Diagnosis present

## 2021-05-13 DIAGNOSIS — Z5111 Encounter for antineoplastic chemotherapy: Secondary | ICD-10-CM | POA: Diagnosis not present

## 2021-05-13 DIAGNOSIS — Z170 Estrogen receptor positive status [ER+]: Secondary | ICD-10-CM

## 2021-05-13 DIAGNOSIS — C50811 Malignant neoplasm of overlapping sites of right female breast: Secondary | ICD-10-CM | POA: Insufficient documentation

## 2021-05-13 DIAGNOSIS — C7951 Secondary malignant neoplasm of bone: Secondary | ICD-10-CM | POA: Diagnosis not present

## 2021-05-13 LAB — CBC WITH DIFFERENTIAL/PLATELET
Abs Immature Granulocytes: 0.01 K/uL (ref 0.00–0.07)
Basophils Absolute: 0 K/uL (ref 0.0–0.1)
Basophils Relative: 0 %
Eosinophils Absolute: 0.1 K/uL (ref 0.0–0.5)
Eosinophils Relative: 2 %
HCT: 34.2 % — ABNORMAL LOW (ref 36.0–46.0)
Hemoglobin: 11.5 g/dL — ABNORMAL LOW (ref 12.0–15.0)
Immature Granulocytes: 0 %
Lymphocytes Relative: 35 %
Lymphs Abs: 2.4 K/uL (ref 0.7–4.0)
MCH: 30 pg (ref 26.0–34.0)
MCHC: 33.6 g/dL (ref 30.0–36.0)
MCV: 89.3 fL (ref 80.0–100.0)
Monocytes Absolute: 0.6 K/uL (ref 0.1–1.0)
Monocytes Relative: 9 %
Neutro Abs: 3.7 K/uL (ref 1.7–7.7)
Neutrophils Relative %: 54 %
Platelets: 283 K/uL (ref 150–400)
RBC: 3.83 MIL/uL — ABNORMAL LOW (ref 3.87–5.11)
RDW: 13 % (ref 11.5–15.5)
WBC: 6.9 K/uL (ref 4.0–10.5)
nRBC: 0 % (ref 0.0–0.2)

## 2021-05-13 LAB — COMPREHENSIVE METABOLIC PANEL WITH GFR
ALT: 20 U/L (ref 0–44)
AST: 29 U/L (ref 15–41)
Albumin: 4.3 g/dL (ref 3.5–5.0)
Alkaline Phosphatase: 85 U/L (ref 38–126)
Anion gap: 10 (ref 5–15)
BUN: 12 mg/dL (ref 6–20)
CO2: 23 mmol/L (ref 22–32)
Calcium: 9.1 mg/dL (ref 8.9–10.3)
Chloride: 106 mmol/L (ref 98–111)
Creatinine, Ser: 0.82 mg/dL (ref 0.44–1.00)
GFR, Estimated: >60 mL/min
Glucose, Bld: 117 mg/dL — ABNORMAL HIGH (ref 70–99)
Potassium: 3.4 mmol/L — ABNORMAL LOW (ref 3.5–5.1)
Sodium: 139 mmol/L (ref 135–145)
Total Bilirubin: 0.4 mg/dL (ref 0.3–1.2)
Total Protein: 7.7 g/dL (ref 6.5–8.1)

## 2021-05-13 LAB — MAGNESIUM: Magnesium: 2.1 mg/dL (ref 1.7–2.4)

## 2021-05-13 MED ORDER — HEPARIN SOD (PORK) LOCK FLUSH 100 UNIT/ML IV SOLN
INTRAVENOUS | Status: AC
  Filled 2021-05-13: qty 5

## 2021-05-13 MED ORDER — ZOLEDRONIC ACID 4 MG/100ML IV SOLN
4.0000 mg | Freq: Once | INTRAVENOUS | Status: AC
  Administered 2021-05-13: 4 mg via INTRAVENOUS
  Filled 2021-05-13: qty 100

## 2021-05-13 MED ORDER — SODIUM CHLORIDE 0.9% FLUSH
10.0000 mL | Freq: Once | INTRAVENOUS | Status: AC
  Administered 2021-05-13: 10 mL via INTRAVENOUS
  Filled 2021-05-13: qty 10

## 2021-05-13 MED ORDER — FULVESTRANT 250 MG/5ML IM SOLN
500.0000 mg | INTRAMUSCULAR | Status: DC
  Administered 2021-05-13: 500 mg via INTRAMUSCULAR
  Filled 2021-05-13: qty 10

## 2021-05-13 MED ORDER — HEPARIN SOD (PORK) LOCK FLUSH 100 UNIT/ML IV SOLN
500.0000 [IU] | Freq: Once | INTRAVENOUS | Status: AC
Start: 2021-05-13 — End: 2021-05-13
  Administered 2021-05-13: 500 [IU] via INTRAVENOUS
  Filled 2021-05-13: qty 5

## 2021-05-13 NOTE — Patient Instructions (Signed)
[facility]  Discharge Instructions: Thank you for choosing [facility] to provide your oncology and hematology care.  If you have a lab appointment with the [facility], please go directly to the [facility] and check in at the registration area.  Wear comfortable clothing and clothing appropriate for easy access to any Portacath or PICC line.   We strive to give you quality time with your provider. You may need to reschedule your appointment if you arrive late (15 or more minutes).  Arriving late affects you and other patients whose appointments are after yours.  Also, if you miss three or more appointments without notifying the office, you may be dismissed from the clinic at the provider's discretion.      For prescription refill requests, have your pharmacy contact our office and allow 72 hours for refills to be completed.    Today you received the following chemotherapy and/or immunotherapy agents       To help prevent nausea and vomiting after your treatment, we encourage you to take your nausea medication as directed.  BELOW ARE SYMPTOMS THAT SHOULD BE REPORTED IMMEDIATELY: . *FEVER GREATER THAN 100.4 F (38 C) OR HIGHER . *CHILLS OR SWEATING . *NAUSEA AND VOMITING THAT IS NOT CONTROLLED WITH YOUR NAUSEA MEDICATION . *UNUSUAL SHORTNESS OF BREATH . *UNUSUAL BRUISING OR BLEEDING . *URINARY PROBLEMS (pain or burning when urinating, or frequent urination) . *BOWEL PROBLEMS (unusual diarrhea, constipation, pain near the anus) . TENDERNESS IN MOUTH AND THROAT WITH OR WITHOUT PRESENCE OF ULCERS (sore throat, sores in mouth, or a toothache) . UNUSUAL RASH, SWELLING OR PAIN  . UNUSUAL VAGINAL DISCHARGE OR ITCHING   Items with * indicate a potential emergency and should be followed up as soon as possible or go to the Emergency Department if any problems should occur.  Please show the CHEMOTHERAPY ALERT CARD or IMMUNOTHERAPY ALERT CARD at  check-in to the Emergency Department and triage nurse.  Should you have questions after your visit or need to cancel or reschedule your appointment, please contact [facility]  [phone] and follow the prompts.  Office hours are 8:00 a.m. to 4:30 p.m. Monday - Friday. Please note that voicemails left after 4:00 p.m. may not be returned until the following business day.  We are closed weekends and major holidays. You have access to a nurse at all times for urgent questions. Please call the main number to the clinic [phone] and follow the prompts.  For any non-urgent questions, you may also contact your provider using MyChart. We now offer e-Visits for anyone 38 and older to request care online for non-urgent symptoms. For details visit [email or web address].   Also download the MyChart app! Go to the app store, search "MyChart", open the app, select [facility], and log in with your MyChart username and password.  Due to Covid, a mask is required upon entering the hospital/clinic. If you do not have a mask, one will be given to you upon arrival. For doctor visits, patients may have 1 support person aged 38 or older with them. For treatment visits, patients cannot have anyone with them due to current Covid guidelines and our immunocompromised population. Fulvestrant injection What is this medicine? FULVESTRANT (ful VES trant) blocks the effects of estrogen. It is used to treat breast cancer. This medicine may be used for other purposes; ask your health care provider or pharmacist if you have questions. COMMON BRAND NAME(S): FASLODEX What should  I tell my health care provider before I take this medicine? They need to know if you have any of these conditions:  bleeding disorders  liver disease  low blood counts, like low white cell, platelet, or red cell counts  an unusual or allergic reaction to fulvestrant, other medicines, foods, dyes, or  preservatives  pregnant or trying to get pregnant  breast-feeding How should I use this medicine? This medicine is for injection into a muscle. It is usually given by a health care professional in a hospital or clinic setting. Talk to your pediatrician regarding the use of this medicine in children. Special care may be needed. Overdosage: If you think you have taken too much of this medicine contact a poison control center or emergency room at once. NOTE: This medicine is only for you. Do not share this medicine with others. What if I miss a dose? It is important not to miss your dose. Call your doctor or health care professional if you are unable to keep an appointment. What may interact with this medicine?  medicines that treat or prevent blood clots like warfarin, enoxaparin, dalteparin, apixaban, dabigatran, and rivaroxaban This list may not describe all possible interactions. Give your health care provider a list of all the medicines, herbs, non-prescription drugs, or dietary supplements you use. Also tell them if you smoke, drink alcohol, or use illegal drugs. Some items may interact with your medicine. What should I watch for while using this medicine? Your condition will be monitored carefully while you are receiving this medicine. You will need important blood work done while you are taking this medicine. Do not become pregnant while taking this medicine or for at least 1 year after stopping it. Women of child-bearing potential will need to have a negative pregnancy test before starting this medicine. Women should inform their doctor if they wish to become pregnant or think they might be pregnant. There is a potential for serious side effects to an unborn child. Men should inform their doctors if they wish to father a child. This medicine may lower sperm counts. Talk to your health care professional or pharmacist for more information. Do not breast-feed an infant while taking this medicine  or for 1 year after the last dose. What side effects may I notice from receiving this medicine? Side effects that you should report to your doctor or health care professional as soon as possible:  allergic reactions like skin rash, itching or hives, swelling of the face, lips, or tongue  feeling faint or lightheaded, falls  pain, tingling, numbness, or weakness in the legs  signs and symptoms of infection like fever or chills; cough; flu-like symptoms; sore throat  vaginal bleeding Side effects that usually do not require medical attention (report to your doctor or health care professional if they continue or are bothersome):  aches, pains  constipation  diarrhea  headache  hot flashes  nausea, vomiting  pain at site where injected  stomach pain This list may not describe all possible side effects. Call your doctor for medical advice about side effects. You may report side effects to FDA at 1-800-FDA-1088. Where should I keep my medicine? This drug is given in a hospital or clinic and will not be stored at home. NOTE: This sheet is a summary. It may not cover all possible information. If you have questions about this medicine, talk to your doctor, pharmacist, or health care provider.  2021 Elsevier/Gold Standard (2018-03-08 11:34:41) Zoledronic Acid Injection (Hypercalcemia, Oncology) What  is this medicine? ZOLEDRONIC ACID (ZOE le dron ik AS id) slows calcium loss from bones. It high calcium levels in the blood from some kinds of cancer. It may be used in other people at risk for bone loss. This medicine may be used for other purposes; ask your health care provider or pharmacist if you have questions. COMMON BRAND NAME(S): Zometa What should I tell my health care provider before I take this medicine? They need to know if you have any of these conditions:  cancer  dehydration  dental disease  kidney disease  liver disease  low levels of calcium in the  blood  lung or breathing disease (asthma)  receiving steroids like dexamethasone or prednisone  an unusual or allergic reaction to zoledronic acid, other medicines, foods, dyes, or preservatives  pregnant or trying to get pregnant  breast-feeding How should I use this medicine? This drug is injected into a vein. It is given by a health care provider in a hospital or clinic setting. Talk to your health care provider about the use of this drug in children. Special care may be needed. Overdosage: If you think you have taken too much of this medicine contact a poison control center or emergency room at once. NOTE: This medicine is only for you. Do not share this medicine with others. What if I miss a dose? Keep appointments for follow-up doses. It is important not to miss your dose. Call your health care provider if you are unable to keep an appointment. What may interact with this medicine?  certain antibiotics given by injection  NSAIDs, medicines for pain and inflammation, like ibuprofen or naproxen  some diuretics like bumetanide, furosemide  teriparatide  thalidomide This list may not describe all possible interactions. Give your health care provider a list of all the medicines, herbs, non-prescription drugs, or dietary supplements you use. Also tell them if you smoke, drink alcohol, or use illegal drugs. Some items may interact with your medicine. What should I watch for while using this medicine? Visit your health care provider for regular checks on your progress. It may be some time before you see the benefit from this drug. Some people who take this drug have severe bone, joint, or muscle pain. This drug may also increase your risk for jaw problems or a broken thigh bone. Tell your health care provider right away if you have severe pain in your jaw, bones, joints, or muscles. Tell you health care provider if you have any pain that does not go away or that gets worse. Tell your  dentist and dental surgeon that you are taking this drug. You should not have major dental surgery while on this drug. See your dentist to have a dental exam and fix any dental problems before starting this drug. Take good care of your teeth while on this drug. Make sure you see your dentist for regular follow-up appointments. You should make sure you get enough calcium and vitamin D while you are taking this drug. Discuss the foods you eat and the vitamins you take with your health care provider. Check with your health care provider if you have severe diarrhea, nausea, and vomiting, or if you sweat a lot. The loss of too much body fluid may make it dangerous for you to take this drug. You may need blood work done while you are taking this drug. Do not become pregnant while taking this drug. Women should inform their health care provider if they wish to become  pregnant or think they might be pregnant. There is potential for serious harm to an unborn child. Talk to your health care provider for more information. What side effects may I notice from receiving this medicine? Side effects that you should report to your doctor or health care provider as soon as possible:  allergic reactions (skin rash, itching or hives; swelling of the face, lips, or tongue)  bone pain  infection (fever, chills, cough, sore throat, pain or trouble passing urine)  jaw pain, especially after dental work  joint pain  kidney injury (trouble passing urine or change in the amount of urine)  low blood pressure (dizziness; feeling faint or lightheaded, falls; unusually weak or tired)  low calcium levels (fast heartbeat; muscle cramps or pain; pain, tingling, or numbness in the hands or feet; seizures)  low magnesium levels (fast, irregular heartbeat; muscle cramp or pain; muscle weakness; tremors; seizures)  low red blood cell counts (trouble breathing; feeling faint; lightheaded, falls; unusually weak or  tired)  muscle pain  redness, blistering, peeling, or loosening of the skin, including inside the mouth  severe diarrhea  swelling of the ankles, feet, hands  trouble breathing Side effects that usually do not require medical attention (report to your doctor or health care provider if they continue or are bothersome):  anxious  constipation  coughing  depressed mood  eye irritation, itching, or pain  fever  general ill feeling or flu-like symptoms  nausea  pain, redness, or irritation at site where injected  trouble sleeping This list may not describe all possible side effects. Call your doctor for medical advice about side effects. You may report side effects to FDA at 1-800-FDA-1088. Where should I keep my medicine? This drug is given in a hospital or clinic. It will not be stored at home. NOTE: This sheet is a summary. It may not cover all possible information. If you have questions about this medicine, talk to your doctor, pharmacist, or health care provider.  2021 Elsevier/Gold Standard (2019-09-12 09:13:00)

## 2021-05-25 ENCOUNTER — Other Ambulatory Visit: Payer: Self-pay | Admitting: Oncology

## 2021-05-27 ENCOUNTER — Encounter: Payer: Self-pay | Admitting: Oncology

## 2021-05-28 ENCOUNTER — Inpatient Hospital Stay: Payer: Medicare HMO

## 2021-05-28 ENCOUNTER — Inpatient Hospital Stay (HOSPITAL_BASED_OUTPATIENT_CLINIC_OR_DEPARTMENT_OTHER): Payer: Medicare HMO | Admitting: Oncology

## 2021-05-28 ENCOUNTER — Encounter: Payer: Self-pay | Admitting: Oncology

## 2021-05-28 VITALS — BP 113/71 | HR 103 | Temp 96.6°F | Resp 16 | Ht 67.0 in | Wt 190.0 lb

## 2021-05-28 DIAGNOSIS — C7951 Secondary malignant neoplasm of bone: Secondary | ICD-10-CM

## 2021-05-28 DIAGNOSIS — T451X5A Adverse effect of antineoplastic and immunosuppressive drugs, initial encounter: Secondary | ICD-10-CM

## 2021-05-28 DIAGNOSIS — Z170 Estrogen receptor positive status [ER+]: Secondary | ICD-10-CM

## 2021-05-28 DIAGNOSIS — G620 Drug-induced polyneuropathy: Secondary | ICD-10-CM

## 2021-05-28 DIAGNOSIS — Z5111 Encounter for antineoplastic chemotherapy: Secondary | ICD-10-CM | POA: Diagnosis not present

## 2021-05-28 DIAGNOSIS — C50811 Malignant neoplasm of overlapping sites of right female breast: Secondary | ICD-10-CM | POA: Diagnosis not present

## 2021-05-28 DIAGNOSIS — Z5112 Encounter for antineoplastic immunotherapy: Secondary | ICD-10-CM

## 2021-05-28 DIAGNOSIS — C50919 Malignant neoplasm of unspecified site of unspecified female breast: Secondary | ICD-10-CM

## 2021-05-28 DIAGNOSIS — C50411 Malignant neoplasm of upper-outer quadrant of right female breast: Secondary | ICD-10-CM

## 2021-05-28 LAB — COMPREHENSIVE METABOLIC PANEL
ALT: 20 U/L (ref 0–44)
AST: 26 U/L (ref 15–41)
Albumin: 4.2 g/dL (ref 3.5–5.0)
Alkaline Phosphatase: 78 U/L (ref 38–126)
Anion gap: 9 (ref 5–15)
BUN: 18 mg/dL (ref 6–20)
CO2: 24 mmol/L (ref 22–32)
Calcium: 9.5 mg/dL (ref 8.9–10.3)
Chloride: 103 mmol/L (ref 98–111)
Creatinine, Ser: 0.78 mg/dL (ref 0.44–1.00)
GFR, Estimated: >60 mL/min (ref >60)
Glucose, Bld: 128 mg/dL — ABNORMAL HIGH (ref 70–99)
Potassium: 3.3 mmol/L — ABNORMAL LOW (ref 3.5–5.1)
Sodium: 136 mmol/L (ref 135–145)
Total Bilirubin: 0.4 mg/dL (ref 0.3–1.2)
Total Protein: 7.5 g/dL (ref 6.5–8.1)

## 2021-05-28 LAB — CBC WITH DIFFERENTIAL/PLATELET
Abs Immature Granulocytes: 0.03 10*3/uL (ref 0.00–0.07)
Basophils Absolute: 0.1 10*3/uL (ref 0.0–0.1)
Basophils Relative: 1 %
Eosinophils Absolute: 0.1 10*3/uL (ref 0.0–0.5)
Eosinophils Relative: 2 %
HCT: 33.2 % — ABNORMAL LOW (ref 36.0–46.0)
Hemoglobin: 11.1 g/dL — ABNORMAL LOW (ref 12.0–15.0)
Immature Granulocytes: 0 %
Lymphocytes Relative: 25 %
Lymphs Abs: 2.2 10*3/uL (ref 0.7–4.0)
MCH: 30.2 pg (ref 26.0–34.0)
MCHC: 33.4 g/dL (ref 30.0–36.0)
MCV: 90.5 fL (ref 80.0–100.0)
Monocytes Absolute: 0.7 10*3/uL (ref 0.1–1.0)
Monocytes Relative: 8 %
Neutro Abs: 5.7 10*3/uL (ref 1.7–7.7)
Neutrophils Relative %: 64 %
Platelets: 299 10*3/uL (ref 150–400)
RBC: 3.67 MIL/uL — ABNORMAL LOW (ref 3.87–5.11)
RDW: 13 % (ref 11.5–15.5)
WBC: 8.9 10*3/uL (ref 4.0–10.5)
nRBC: 0 % (ref 0.0–0.2)

## 2021-05-28 LAB — MAGNESIUM: Magnesium: 2 mg/dL (ref 1.7–2.4)

## 2021-05-28 MED ORDER — SODIUM CHLORIDE 0.9% FLUSH
10.0000 mL | Freq: Once | INTRAVENOUS | Status: AC
  Filled 2021-05-28: qty 10

## 2021-05-28 MED ORDER — SODIUM CHLORIDE 0.9 % IV SOLN
Freq: Once | INTRAVENOUS | Status: AC
  Filled 2021-05-28: qty 250

## 2021-05-28 MED ORDER — HEPARIN SOD (PORK) LOCK FLUSH 100 UNIT/ML IV SOLN
500.0000 [IU] | Freq: Once | INTRAVENOUS | Status: AC | PRN
  Administered 2021-05-28: 500 [IU]
  Filled 2021-05-28: qty 5

## 2021-05-28 MED ORDER — DIPHENHYDRAMINE HCL 25 MG PO CAPS
50.0000 mg | ORAL_CAPSULE | Freq: Once | ORAL | Status: AC
  Administered 2021-05-28: 50 mg via ORAL
  Filled 2021-05-28: qty 2

## 2021-05-28 MED ORDER — TRASTUZUMAB-ANNS CHEMO 150 MG IV SOLR
6.0000 mg/kg | Freq: Once | INTRAVENOUS | Status: AC
  Administered 2021-05-28: 504 mg via INTRAVENOUS
  Filled 2021-05-28: qty 24

## 2021-05-28 MED ORDER — HEPARIN SOD (PORK) LOCK FLUSH 100 UNIT/ML IV SOLN
INTRAVENOUS | Status: AC
  Filled 2021-05-28: qty 5

## 2021-05-28 MED ORDER — SODIUM CHLORIDE 0.9% FLUSH
10.0000 mL | Freq: Once | INTRAVENOUS | Status: AC
Start: 2021-05-28 — End: 2021-05-28
  Administered 2021-05-28: 10 mL via INTRAVENOUS
  Filled 2021-05-28: qty 10

## 2021-05-28 MED ORDER — SODIUM CHLORIDE 0.9 % IV SOLN
420.0000 mg | Freq: Once | INTRAVENOUS | Status: AC
  Administered 2021-05-28: 420 mg via INTRAVENOUS
  Filled 2021-05-28: qty 14

## 2021-05-28 MED ORDER — ACETAMINOPHEN 325 MG PO TABS
650.0000 mg | ORAL_TABLET | Freq: Once | ORAL | Status: AC
  Administered 2021-05-28: 650 mg via ORAL
  Filled 2021-05-28: qty 2

## 2021-05-28 NOTE — Patient Instructions (Signed)
[facility]   Discharge Instructions: Thank you for choosing [facility] to provide your oncology and hematology care.  If you have a lab appointment with the [facility], please go directly to the [facility] and check in at the registration area.  Wear comfortable clothing and clothing appropriate for easy access to any Portacath or PICC line.   We strive to give you quality time with your provider. You may need to reschedule your appointment if you arrive late (15 or more minutes).  Arriving late affects you and other patients whose appointments are after yours.  Also, if you miss three or more appointments without notifying the office, you may be dismissed from the clinic at the provider's discretion.      For prescription refill requests, have your pharmacy contact our office and allow 72 hours for refills to be completed.    Today you received the following chemotherapy and/or immunotherapy agents: Kanjinti, Perjeta.      To help prevent nausea and vomiting after your treatment, we encourage you to take your nausea medication as directed.  BELOW ARE SYMPTOMS THAT SHOULD BE REPORTED IMMEDIATELY: *FEVER GREATER THAN 100.4 F (38 C) OR HIGHER *CHILLS OR SWEATING *NAUSEA AND VOMITING THAT IS NOT CONTROLLED WITH YOUR NAUSEA MEDICATION *UNUSUAL SHORTNESS OF BREATH *UNUSUAL BRUISING OR BLEEDING *URINARY PROBLEMS (pain or burning when urinating, or frequent urination) *BOWEL PROBLEMS (unusual diarrhea, constipation, pain near the anus) TENDERNESS IN MOUTH AND THROAT WITH OR WITHOUT PRESENCE OF ULCERS (sore throat, sores in mouth, or a toothache) UNUSUAL RASH, SWELLING OR PAIN  UNUSUAL VAGINAL DISCHARGE OR ITCHING   Items with * indicate a potential emergency and should be followed up as soon as possible or go to the Emergency Department if any problems should occur.  Please show the CHEMOTHERAPY ALERT CARD or IMMUNOTHERAPY ALERT CARD  at check-in to the Emergency Department and triage nurse.  Should you have questions after your visit or need to cancel or reschedule your appointment, please contact [facility]  [phone] and follow the prompts.  Office hours are 8:00 a.m. to 4:30 p.m. Monday - Friday. Please note that voicemails left after 4:00 p.m. may not be returned until the following business day.  We are closed weekends and major holidays. You have access to a nurse at all times for urgent questions. Please call the main number to the clinic [phone] and follow the prompts.  For any non-urgent questions, you may also contact your provider using MyChart. We now offer e-Visits for anyone 64 and older to request care online for non-urgent symptoms. For details visit [email or web address].   Also download the MyChart app! Go to the app store, search "MyChart", open the app, select [facility], and log in with your MyChart username and password.  Due to Covid, a mask is required upon entering the hospital/clinic. If you do not have a mask, one will be given to you upon arrival. For doctor visits, patients may have 1 support person aged 79 or older with them. For treatment visits, patients cannot have anyone with them due to current Covid guidelines and our immunocompromised population.

## 2021-05-28 NOTE — Progress Notes (Signed)
Pulse Rate: 103. MD, Dr. Tasia Catchings, aware. Per MD order: proceed with scheduled Kanjinti and Perjeta treatment today.

## 2021-05-28 NOTE — Progress Notes (Signed)
Hematology/Oncology Follow Up Note [place] [phone] [phone]  Patient Care Team: Marguerita Merles, MD as PCP - General (Family Medicine) End, Harrell Gave, MD as PCP - Cardiology (Cardiology) Rico Junker, RN as Oncology Nurse Navigator Earlie Server, MD as Consulting Physician (Oncology) Bary Castilla, Forest Gleason, MD (General Surgery) Noreene Filbert, MD as Referring Physician (Radiation Oncology) Noreene Filbert, MD as Radiation Oncologist (Radiation Oncology) Noreene Filbert, MD as Radiation Oncologist (Radiation Oncology)   Name of the patient: Theresa Chaney  [id number]  June 17, 1981   Date of visit: 05/28/21 REASON FOR VISIT Follow up for Assessment prior to chemotherapy treatment of breast cancer  Oncology History Cancer TREATMENT Neoadjuvant ddAC +one dose of Taxol, due to lack of response, surgery was offered. Case was discussed on breast tumor board. 03/19/2018 S/p right mastectomy and right axillary dissection, immediate breast reconstruction with placement of expanders.  ypT3 ypN2, + lymphovascular invasion,  Grade 3, margin is negative, close. ER 90%, PR 0%, HER2 IHC negative.  Also had elective bilateral salpingo-oophorectomy..  # 06/11/2018 s/p 11 cycles Taxol adjuvantly. Tolerated well.  # She has obtained dental clearance for starting Zometa.  S/p Zometa on 6/3/ 2019 # s/p adjuvant right chest wall radiation, finished 10/10/2018 #06/03/2019 underwent elective left prophylactic mastectomy and sentinel lymph node biopsy of left axilla.  # 06/03/2019 underwent elective left prophylactic mastectomy and sentinel lymph node biopsy of left axilla. Pathology negative for malignancy. Status post Mediport removal on 06/03/2019. She also underwent right implant removal on 06/03/2019. She also underwent right implant removal on 06/03/2019. #Negative genetic testing  #  chemotherapy-induced neuropathy, bilateral fingertips and lower extremities.   Patient is on Lyrica and nortriptyline.  Follows up with neurology. She follows up with lymphedema clinic.  #  07/07/2020, MRI thoracic spine without contrast showed lesions involving the T7 posterior elements most concerning for metastatic lesion.  No evidence of epidural tumor.  Minimal thoracic spondylosis without stenosis. MRI was reviewed by me and a PET scan was obtained for further evaluation. 07/20/2020, PET scan showed hypermetabolic metastasis involving the posterior element of T7, no additional evidence of metastasis in the neck, chest, abdomen or pelvis.  # 07/29/2020 T7 lesion biopsy showed metastatic carcinoma, compatible with breast origin.  Receptor status staining showed ER 71-80% positive, PR negative, HER-2 positive IHC 3+ # Patient finished spine radiation on 08/31/2020   positive for COVID-19 on 01/11/2021  # 03/15/2021, PET scan showed no focal hypermetabolic activity to suggest skeletal metastasis.  Mild hypermetabolic activity along the right T7-8 paraspinal musculature.  Max SUV 3.3.  Likely postprocedural-    INTERVAL HISTORY 40 yo female with above oncology history reviewed by me presents for follow-up of management of metastatic breast cancer.  Chronic intermittent back pain, unchanged Chronic cough/. Denies any palpitation or chest pain, shortness of breath..No nausea vomiting. Lower extremity cramps when she eats banana or oral potassium supplementation.    Review of Systems  Constitutional:  Negative for chills, fever, malaise/fatigue and weight loss.  HENT:  Negative for sore throat.   Eyes:  Negative for redness.  Respiratory:  Negative for cough, shortness of breath and wheezing.   Cardiovascular:  Negative for chest pain, palpitations and leg swelling.  Gastrointestinal:  Positive for nausea. Negative for abdominal pain, blood in stool, heartburn and vomiting.  Genitourinary:  Negative for dysuria.  Musculoskeletal:  Positive for back pain and joint pain.  Negative for myalgias.  Skin:  Negative for rash.  Neurological:  Positive for  tingling. Negative for dizziness and tremors.  Endo/Heme/Allergies:  Does not bruise/bleed easily.  Psychiatric/Behavioral:  Negative for hallucinations.    No Known Allergies  Patient Active Problem List   Diagnosis Date Noted   Tachycardia, unspecified 04/15/2021   Bone metastasis ([place]) 03/25/2021   Encounter for monoclonal antibody treatment for malignancy 12/10/2020   Bone lesion 11/19/2020   Hypocalcemia 11/19/2020   Inflammatory arthritis 11/19/2020   Encounter for antineoplastic chemotherapy 10/29/2020   Metastatic breast cancer ([place]) 08/31/2020   Goals of care, counseling/discussion 08/11/2020   Neuropathy due to chemotherapeutic drug ([place]) 08/11/2020   HER2-positive carcinoma of breast ([place]) 08/11/2020   Rheumatoid arteritis ([place]) 10/13/2019   Bilateral hand swelling 10/03/2019   Fracture of neck of metacarpal bone 05/14/2019   Chronic fatigue 04/09/2019   Polyarthralgia 04/09/2019   Status post right breast reconstruction 02/26/2019   Status post right mastectomy 02/26/2019   Mastalgia 02/15/2019   Shortness of breath 08/23/2018   Nonischemic cardiomyopathy ([place]) 08/23/2018   Preprocedural cardiovascular examination 08/23/2018   Tachycardia 08/23/2018   Palpitations 08/23/2018   Estrogen receptor positive status (ER+) 04/04/2018   Acquired absence of right breast and nipple 04/03/2018   Breast cancer of upper-outer quadrant of right female breast ([place]) 03/19/2018   Family history of cancer    Malignant neoplasm of overlapping sites of right breast in female, estrogen receptor positive ([place]) 10/19/2017   Gastroesophageal reflux disease without esophagitis 02/24/2017   Generalized anxiety disorder 10/03/2014   Headache 10/03/2014     Past Medical History:  Diagnosis Date   Anemia    Arthritis    BRCA negative 11/26/2017   Breast cancer ([place]) 10/11/2017   Multifocal, ER positive,  PR negative, HER-2 negative. ypT3 ypN2a 8.7 cm, 4/15 nodes   Cardiomyopathy ([place])    a. 10/2017 Echo: EF 60-65%, no rwma, Gr1 DD, nl RV size/fxn; b. 04/2018 Echo: EF 55-60%, no rwma, Nl RV size/fxn; c. 08/2018 Echo: EF 45%, diff HK, ? HK of antsept wall. Gr1 DD. Mild MR. Mild LAE/RAE. Mod dil RV.    Chronic bronchitis ([place]) 11/2017   COPD (chronic obstructive pulmonary disease) (HCC)    MILD PER CXR   Depression    Family history of cancer    GERD (gastroesophageal reflux disease)    Headache    MIGRAINES   Heart murmur    ASYMPTOMATIC   Personal history of chemotherapy    current for right breast ca     Past Surgical History:  Procedure Laterality Date   AXILLARY LYMPH NODE DISSECTION Right 03/19/2018   Procedure: AXILLARY LYMPH NODE DISSECTION;  Surgeon: Robert Bellow, MD;  Location: [facility];  Service: General;  Laterality: Right;   BREAST BIOPSY Right 10/11/2017   12:30 posterior coil clip invasive mammary carcinoma   BREAST BIOPSY Right 10/11/2017   11:30 middle depth ribbon clip DCIS   BREAST BIOPSY Right 10/11/2017   5:30 anterior depth x shape invasive ductal carcinoma   BREAST IMPLANT REMOVAL Right 06/03/2019   Procedure: REMOVAL OF RIGHT BREAST IMPLANTS;  Surgeon: Wallace Going, DO;  Location: [facility];  Service: Plastics;  Laterality: Right;   BREAST RECONSTRUCTION WITH PLACEMENT OF TISSUE EXPANDER AND FLEX HD (ACELLULAR HYDRATED DERMIS) Right 03/19/2018   Procedure: BREAST RECONSTRUCTION WITH PLACEMENT OF TISSUE EXPANDER AND FLEX HD (ACELLULAR HYDRATED DERMIS);  Surgeon: Wallace Going, DO;  Location: [facility];  Service: Plastics;  Laterality: Right;   CARPAL TUNNEL RELEASE Bilateral 2020   CHOLECYSTECTOMY N/A 04/27/2020  Procedure: LAPAROSCOPIC CHOLECYSTECTOMY WITH INTRAOPERATIVE CHOLANGIOGRAM;  Surgeon: Robert Bellow, MD;  Location: [facility];  Service: General;  Laterality: N/A;   ESOPHAGOGASTRODUODENOSCOPY (EGD) WITH PROPOFOL N/A 04/17/2020    Procedure: ESOPHAGOGASTRODUODENOSCOPY (EGD) WITH PROPOFOL;  Surgeon: Robert Bellow, MD;  Location: [facility];  Service: Endoscopy;  Laterality: N/A;  with biopsy   LAPAROSCOPIC BILATERAL SALPINGO OOPHERECTOMY Bilateral 03/19/2018   Procedure: LAPAROSCOPIC BILATERAL SALPINGO OOPHORECTOMY;  Surgeon: Benjaman Kindler, MD;  Location: [facility];  Service: Gynecology;  Laterality: Bilateral;   MASTECTOMY Right 03/2018   MASTECTOMY W/ SENTINEL NODE BIOPSY Right 03/19/2018   Procedure: MASTECTOMY WITH SENTINEL LYMPH NODE BIOPSY;  Surgeon: Robert Bellow, MD;  Location: [facility];  Service: General;  Laterality: Right;   PORT-A-CATH REMOVAL Left 06/03/2019   Procedure: REMOVAL PORT-A-CATH;  Surgeon: Robert Bellow, MD;  Location: [facility];  Service: General;  Laterality: Left;   PORTACATH PLACEMENT Left 10/24/2017   Procedure: INSERTION PORT-A-CATH;  Surgeon: Robert Bellow, MD;  Location: [facility];  Service: General;  Laterality: Left;   PORTACATH PLACEMENT Right 09/28/2020   Procedure: INSERTION PORT-A-CATH;  Surgeon: Robert Bellow, MD;  Location: [facility];  Service: General;  Laterality: Right;   REMOVAL OF TISSUE EXPANDER AND PLACEMENT OF IMPLANT Right 07/20/2018   Procedure: REMOVAL OF RIGHT BREAST TISSUE EXPANDER AND PLACEMENT OF IMPLANT;  Surgeon: Wallace Going, DO;  Location: [facility];  Service: Plastics;  Laterality: Right;   SIMPLE MASTECTOMY WITH AXILLARY SENTINEL NODE BIOPSY Left 06/03/2019   Procedure: SIMPLE MASTECTOMY LEFT;  Surgeon: Robert Bellow, MD;  Location: [facility];  Service: General;  Laterality: Left;    Social History   Socioeconomic History   Marital status: Married    Spouse name: Not on file   Number of children: Not on file   Years of education: Not on file   Highest education level: Not on file  Occupational History   Occupation: pharmacy tech    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 [employer]   Tobacco Use   Smoking  status: Every Day    Packs/day: 0.50    Years: 18.00    Pack years: 9.00    Types: Cigarettes    Start date: 06/21/2018   Smokeless tobacco: Never  Vaping Use   Vaping Use: Never used  Substance and Sexual Activity   Alcohol use: No   Drug use: No   Sexual activity: Yes    Birth control/protection: Injection, Other-see comments    Comment: has had hysterectomy  Other Topics Concern   Not on file  Social History Narrative   Lives at home with husband and daughter   Social Determinants of Health   Financial Resource Strain: Not on file  Food Insecurity: Not on file  Transportation Needs: Not on file  Physical Activity: Not on file  Stress: Not on file  Social Connections: Not on file  Intimate Partner Violence: Not on file     Family History  Problem Relation Age of Onset   Melanoma Maternal Aunt        other aunts with BCC/SCC/Melanoma   Diabetes Father    Hypertension Father    Hyperlipidemia Father    Heart attack Father 37       "mild"   Bladder Cancer Maternal Grandmother    Cervical cancer Maternal Aunt 64       daughter w/ cervical cancer as well   Melanoma Maternal Uncle        other  uncles with BCC/SCC/Melanoma     Current Outpatient Medications:    acetaminophen (TYLENOL) 500 MG tablet, Take 500 mg by mouth every 6 (six) hours as needed., Disp: , Rfl:    albuterol (VENTOLIN HFA) 108 (90 Base) MCG/ACT inhaler, Inhale 2 puffs into the lungs every 6 (six) hours as needed for wheezing or shortness of breath. , Disp: , Rfl:    Calcium Carbonate-Vitamin D3 (CALCIUM 600/VITAMIN D) 600-400 MG-UNIT TABS, Take 2 tablets by mouth daily. (Patient taking differently: Take 3 tablets by mouth daily.), Disp: 180 tablet, Rfl: 0   cholecalciferol (VITAMIN D3) 25 MCG (1000 UNIT) tablet, Take 1 tablet (1,000 Units total) by mouth daily., Disp: 90 tablet, Rfl: 0   cyanocobalamin (,VITAMIN B-12,) 1000 MCG/ML injection, Inject 1,000 mcg into the skin every 30 (thirty) days.,  Disp: , Rfl:    cyclobenzaprine (FLEXERIL) 5 MG tablet, Take 5 mg by mouth 3 (three) times daily as needed for muscle spasms. , Disp: , Rfl:    escitalopram (LEXAPRO) 20 MG tablet, Take 20 mg by mouth daily. , Disp: , Rfl:    esomeprazole (NEXIUM) 40 MG capsule, Take 40 mg by mouth daily before breakfast. , Disp: , Rfl:    Eszopiclone 3 MG TABS, Take 3 mg by mouth at bedtime as needed (sleep). , Disp: , Rfl:    folic acid (FOLVITE) 1 MG tablet, Take 1 mg by mouth daily., Disp: , Rfl:    lidocaine-prilocaine (EMLA) cream, Apply to port and cover 1-2 hours prior to appointment, Disp: 30 g, Rfl: 3   loperamide (IMODIUM) 2 MG capsule, Take 2 tablets with onset of diarrhea, then take 1 tablet every 2 hours until diarrhea stops. Maximum 8 tablets in 24hours, Disp: 30 capsule, Rfl: 3   loratadine (CLARITIN) 10 MG tablet, Take 10 mg by mouth daily. , Disp: , Rfl:    LORazepam (ATIVAN) 1 MG tablet, Take 1 mg by mouth 3 (three) times daily., Disp: , Rfl:    Magnesium 400 MG TABS, Take 400 mg by mouth 2 (two) times daily., Disp: , Rfl:    meloxicam (MOBIC) 7.5 MG tablet, Take 7.5 mg by mouth 2 (two) times daily as needed (pain/inflammation.). , Disp: , Rfl:    methotrexate (RHEUMATREX) 2.5 MG tablet, Take 25 mg by mouth every Sunday. 10 tablets once a week, Disp: , Rfl:    metoprolol succinate (TOPROL-XL) 25 MG 24 hr tablet, Take 37.5 mg by mouth every morning. , Disp: , Rfl:    mupirocin ointment (BACTROBAN) 2 %, Place 1 application into the nose 2 (two) times daily. Use in each nostril twice daily for five (5) days., Disp: 22 g, Rfl: 5   nortriptyline (PAMELOR) 10 MG capsule, Take 30 mg by mouth at bedtime. , Disp: , Rfl:    oxyCODONE (OXY IR/ROXICODONE) 5 MG immediate release tablet, Take 1 tablet (5 mg total) by mouth every 6 (six) hours as needed for moderate pain or severe pain., Disp: 60 tablet, Rfl: 0   phentermine (ADIPEX-P) 37.5 MG tablet, Take 37.5 mg by mouth daily before breakfast. , Disp: , Rfl:     pregabalin (LYRICA) 150 MG capsule, Take 150 mg by mouth 2 (two) times daily., Disp: , Rfl:    promethazine (PHENERGAN) 25 MG tablet, Take 1 tablet (25 mg total) by mouth every 8 (eight) hours as needed for nausea or vomiting., Disp: 90 tablet, Rfl: 0   pyridOXINE (VITAMIN B-6) 100 MG tablet, Take 100 mg by mouth daily., Disp: ,  Rfl:    topiramate (TOPAMAX) 50 MG tablet, Take 50 mg by mouth 2 (two) times daily. , Disp: , Rfl:    vitamin C (ASCORBIC ACID) 500 MG tablet, Take 500 mg by mouth daily., Disp: , Rfl:    diphenoxylate-atropine (LOMOTIL) 2.5-0.025 MG tablet, Take 1 tablet by mouth 4 (four) times daily as needed for diarrhea or loose stools. (Patient not taking: No sig reported), Disp: 60 tablet, Rfl: 0   ibuprofen (ADVIL) 800 MG tablet, Take 800 mg by mouth every 8 (eight) hours as needed for moderate pain.  (Patient not taking: No sig reported), Disp: , Rfl:  No current facility-administered medications for this visit.  Facility-Administered Medications Ordered in Other Visits:    heparin lock flush 100 unit/mL, 500 Units, Intravenous, Once, Earlie Server, MD   sodium chloride flush (NS) 0.9 % injection 10 mL, 10 mL, Intravenous, PRN, Earlie Server, MD, 10 mL at 11/19/18 1249   sodium chloride flush (NS) 0.9 % injection 10 mL, 10 mL, Intravenous, Once, Earlie Server, MD   Physical exam:  Vitals:   05/28/21 0856 05/28/21 0934  BP: 113/71   Pulse: (!) 112 (!) 103  Resp: 16   Temp: (!) 96.6 F (35.9 C)   TempSrc: Tympanic   SpO2: 100%   Weight: 190 lb (86.2 kg)   Height: 5' 7" (1.702 m)   ECOG 1 Physical Exam Constitutional:      General: She is not in acute distress.    Appearance: She is not diaphoretic.  HENT:     Head: Normocephalic and atraumatic.     Nose: Nose normal.     Mouth/Throat:     Pharynx: No oropharyngeal exudate.  Eyes:     General: No scleral icterus.       Left eye: No discharge.     Conjunctiva/sclera: Conjunctivae normal.     Pupils: Pupils are equal, round,  and reactive to light.  Neck:     Vascular: No JVD.  Cardiovascular:     Rate and Rhythm: Regular rhythm. Tachycardia present.     Heart sounds: Normal heart sounds. No murmur heard. Pulmonary:     Effort: Pulmonary effort is normal. No respiratory distress.     Breath sounds: Normal breath sounds. No wheezing or rales.  Chest:     Chest wall: No tenderness.  Abdominal:     General: Bowel sounds are normal. There is no distension.     Palpations: Abdomen is soft. There is no mass.     Tenderness: There is no abdominal tenderness. There is no rebound.  Musculoskeletal:        General: Normal range of motion.     Cervical back: Normal range of motion and neck supple.  Lymphadenopathy:     Cervical: No cervical adenopathy.  Skin:    General: Skin is warm and dry.     Findings: No erythema or rash.  Neurological:     Mental Status: She is alert and oriented to person, place, and time.     Cranial Nerves: No cranial nerve deficit.     Motor: No abnormal muscle tone.     Coordination: Coordination normal.  Psychiatric:        Mood and Affect: Affect normal.          CMP Latest Ref Rng & Units 05/28/2021  Glucose 70 - 99 mg/dL 128(H)  BUN 6 - 20 mg/dL 18  Creatinine 0.44 - 1.00 mg/dL 0.78  Sodium 135 - 145 mmol/L  136  Potassium 3.5 - 5.1 mmol/L 3.3(L)  Chloride 98 - 111 mmol/L 103  CO2 22 - 32 mmol/L 24  Calcium 8.9 - 10.3 mg/dL 9.5  Total Protein 6.5 - 8.1 g/dL 7.5  Total Bilirubin 0.3 - 1.2 mg/dL 0.4  Alkaline Phos 38 - 126 U/L 78  AST 15 - 41 U/L 26  ALT 0 - 44 U/L 20   CBC Latest Ref Rng & Units 05/28/2021  WBC 4.0 - 10.5 K/uL 8.9  Hemoglobin 12.0 - 15.0 g/dL 11.1(L)  Hematocrit 36.0 - 46.0 % 33.2(L)  Platelets 150 - 400 K/uL 299   RADIOGRAPHIC STUDIES: I have personally reviewed the radiological images as listed and agreed with the findings in the report. NM PET Image Restag (PS) Skull Base To Thigh  Result Date: 03/16/2021 CLINICAL DATA:  Subsequent  treatment strategy for metastatic breast cancer. EXAM: NUCLEAR MEDICINE PET SKULL BASE TO THIGH TECHNIQUE: 10.9 mCi F-18 FDG was injected intravenously. Full-ring PET imaging was performed from the skull base to thigh after the radiotracer. CT data was obtained and used for attenuation correction and anatomic localization. Fasting blood glucose: 108 mg/dl COMPARISON:  PET-CT dated 12/03/2020 FINDINGS: Mediastinal blood pool activity: SUV max 2.0 Liver activity: SUV max NA NECK: No hypermetabolic cervical lymphadenopathy. Incidental CT findings: none CHEST: No suspicious pulmonary nodules. Status post bilateral mastectomy. No metabolic thoracic lymphadenopathy. Status post right axillary lymph node dissection. Right chest port terminates the cavoatrial junction. Incidental CT findings: none ABDOMEN/PELVIS: No abnormal hypermetabolism in the liver, spleen, pancreas, or adrenal glands. No hypermetabolic abdominopelvic lymphadenopathy. Incidental CT findings: Status post cholecystectomy. SKELETON: No focal hypermetabolic activity to suggest skeletal metastasis. Mild hypermetabolism along the right T7-8 paraspinal musculature, max SUV 3.3, likely postprocedural. Incidental CT findings: none IMPRESSION: Status post bilateral mastectomy and right axillary lymph node dissection. Mild residual post procedure hypermetabolism involving the right T7-8 paraspinal musculature. No findings suspicious for recurrent or metastatic disease. Electronically Signed   By: Julian Hy M.D.   On: 03/16/2021 09:11   ECHOCARDIOGRAM LIMITED  Result Date: 05/05/2021    ECHOCARDIOGRAM LIMITED REPORT   Patient Name:   LILLAN MCCREADIE Date of Exam: 05/05/2021 Medical Rec #:  [id number]      Height:       67.0 in Accession #:    [id number]     Weight:       192.1 lb Date of Birth:  [birth year]      BSA:          1.988 m Patient Age:    29 years       BP:           116/72 mmHg Patient Gender: F              HR:           102 bpm. Exam  Location:  [facility] Procedure: Limited Echo, Cardiac Doppler, Color Doppler and Strain Analysis Indications:     R00.0 Tachycardia-unspecified.  History:         Patient has prior history of Echocardiogram examinations, most                  recent 01/29/2021. Cardiomyopathy, COPD; Signs/Symptoms:Murmur.                  Personal history of chemotherapy.  Sonographer:     Sherrie Sport RDCS (AE) Referring Phys:  [id number] ZHOU YU Diagnosing Phys: Kate Sable MD  Sonographer Comments: Global longitudinal strain was attempted.  IMPRESSIONS  1. Left ventricular ejection fraction, by estimation, is 55 to 60%. The left ventricle has normal function. The left ventricle has no regional wall motion abnormalities.  2. Right ventricular systolic function is normal. The right ventricular size is normal.  3. The mitral valve is normal in structure. No evidence of mitral valve regurgitation.  4. The aortic valve is tricuspid. Aortic valve regurgitation is not visualized. FINDINGS  Left Ventricle: Left ventricular ejection fraction, by estimation, is 55 to 60%. The left ventricle has normal function. The left ventricle has no regional wall motion abnormalities. Global longitudinal strain performed but not reported based on interpreter judgement due to suboptimal tracking. 3D left ventricular ejection fraction analysis performed but not reported based on interpreter judgement due to suboptimal quality. The left ventricular internal cavity size was normal in size. There is no left ventricular hypertrophy. Right Ventricle: The right ventricular size is normal. No increase in right ventricular wall thickness. Right ventricular systolic function is normal. Left Atrium: Left atrial size was normal in size. Right Atrium: Right atrial size was normal in size. Mitral Valve: The mitral valve is normal in structure. Tricuspid Valve: The tricuspid valve is normal in structure. Tricuspid valve regurgitation is trivial. Aortic Valve: The aortic  valve is tricuspid. Aortic valve regurgitation is not visualized. Aorta: The aortic root is normal in size and structure. IAS/Shunts: No atrial level shunt detected by color flow Doppler. LEFT VENTRICLE PLAX 2D LVIDd:         4.23 cm LVIDs:         2.98 cm LV PW:         1.09 cm LV IVS:        0.87 cm LVOT diam:     2.00 cm  3D Volume EF: LV SV:         45       3D EF:        53 % LV SV Index:   23       LV EDV:       122 ml LVOT Area:     3.14 cm LV ESV:       57 ml                         LV SV:        65 ml LEFT ATRIUM             Index       RIGHT ATRIUM           Index LA diam:        3.20 cm 1.61 cm/m  RA Area:     12.40 cm LA Vol (A2C):   35.7 ml 17.96 ml/m RA Volume:   26.50 ml  13.33 ml/m LA Vol (A4C):   30.2 ml 15.19 ml/m LA Biplane Vol: 33.2 ml 16.70 ml/m  AORTIC VALVE             PULMONIC VALVE LVOT Vmax:   73.80 cm/s  PV Vmax:        0.69 m/s LVOT Vmean:  53.300 cm/s PV Peak grad:   1.9 mmHg LVOT VTI:    0.144 m     RVOT Peak grad: 2 mmHg  AORTA Ao Root diam: 2.80 cm  SHUNTS Systemic VTI:  0.14 m Systemic Diam: 2.00 cm Kate Sable MD Electronically signed by Kate Sable MD Signature Date/Time: 05/05/2021/1:18:12 PM    Final  Assessment and plan-  Cancer Staging Breast cancer of upper-outer quadrant of right female breast ([place]) Staging form: Breast, AJCC 8th Edition - Clinical: G2, ER+, PR+, HER2- - Signed by Earlie Server, MD on 04/05/2018 Histologic grading system: 3 grade system - Pathologic stage from 04/04/2018: No Stage Recommended (ypT3, pN2, cM0, G3, ER+, PR-, HER2-) - Signed by Earlie Server, MD on 04/04/2018 Stage prefix: Post-therapy Neoadjuvant therapy: Yes Response to neoadjuvant therapy: No response Method of lymph node assessment: Axillary lymph node dissection Multigene prognostic tests performed: None Histologic grading system: 3 grade system Laterality: Right - Pathologic: Stage IV (rpTX, pNX, cM1, ER+, PR-, HER2+) - Signed by Earlie Server, MD on  12/10/2020 Stage prefix: Recurrence    1. Encounter for monoclonal antibody treatment for malignancy   2. Malignant neoplasm of overlapping sites of right breast in female, estrogen receptor positive ([place])   3. HER2-positive carcinoma of breast ([place])   4. Neuropathy due to chemotherapeutic drug ([place])   5. Bone metastasis ([facility])    Recurrent breast cancer with isolated bone metastasis, stage IV #Initially stage IIIA right breast cancer, ER positive, HER-2 negative status post bilateral oophorectomy, Right mastectomy and right axillary lymph node dissection, status post implant, and implant removal.  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 Estrogen positive, HER-2 positive breast cancer. Palliative radiation to T7  Labs reviewed and discussed with patient. Proceed with transtuzumab and pertuzumab today.  Fulvestrant monthly.   #Anemia, mild.  Likely secondary to methotrexate.  Monitor. #Tachycardia, chronic intermittent issue for her.  Heart rate is normal today at 98 525/22 2D echocardiogram LVEF 55%-60%. She is on metoprolol.   # Hypokalemia, she reports oral potassium causes her to have cramps.  Proceed with IV Kcl 55mq over 1 hour.  #Treatment induced diarrhea,Continue Lomotil as needed.  Symptoms are stable. #Bone metastasis, status post radiation. Continue Zometa monthly. #Chronic hypokalemia, not tolerating oral potassium supplementation and banana.  Discussed about other potassium rich food and the patient will try and update me. #Back pain, monitor symptoms.  continue oxycodone 5 mg daily 6 hours as needed #Chemotherapy-induced neuropathy, on Lyrica and nortriptyline, continue.  #Depression.  Continue Lexapro 20 mg daily. #Rheumatoid arthritis,   Follow up with  rheumatology.  Patient is on methotrexate. # Hot flash due to postmenopausal state, already on Lexapro. Can not combine with Effexor.     Follow-up in 3 weeks for the next cycle of trastuzumab and Pertuzumab treatments.  Patient knows to call clinic if she experiences concerning side effects  ZEarlie Server MD, PhD Hematology Oncology [facility]06/17/22

## 2021-06-13 NOTE — Progress Notes (Signed)
Cardiology Office Note    Date:  06/15/2021   ID:  AMAKA GLUTH, DOB November 05, 1981, MRN [id number]  PCP:  Marguerita Merles, MD  Cardiologist:  Nelva Bush, MD  Electrophysiologist:  None   Chief Complaint: Follow up  History of Present Illness:   Theresa Chaney is a 40 y.o. female with history of recurrent breast cancer with isolated bone metastasis s/p mastectomy and chemoradiation complicated by a mildly reduced LVEF with subsequent normalization of LVSF, WCT, paroxysmal SVT, lymphedema followed by the lymphedema clinic, COPD, anemia, migraine disorder, and GERD who presents for follow up of recent echo.   She was previously evaluated in 10/2017 for intermittent sinus tachycardia and DOE. At that time echoes in 10/2017 and 04/2018 showed normal LV function and it was ultimately felt her symptoms were secondary to chemotherapy. In 08/2018, she experienced upper extremity swelling with repeat echo at that time showing an EF of 45% with question of anteroseptal hypokinesis and moderate right ventricular enlargement. Upon her primary cardiologist reviewing the echo, it was felt her EF was closer to 50-55%. She was placed on Toprol XL. Lexiscan Myoview in 10/2018 showed a medium defect of moderate severity present in the the basal anterior and mild anterior location, EF 55-65%. This was a very suboptimal study due to breast attenuation artifact as well as extracardiac uptake at the inferior border of the heart. There was reversible anterior wall defect suggestive of ischemia, though it was felt this was likely artifact given the defect did not extend to the apex. She was seen in the office in 11/2018 for follow up and was feeling well outside of intermittent palpitations over the prior couple of weeks with report of heart rates of 150 bpm. Episodes would typically last a few minutes. In this setting, she wore a Zio monitor that showed the predominant rhythm was sinus with an average heart rate of 82 bpm  (range 48-175 bpm). Rare PACs and PVCs were noted with a single atrial run lasting 4 beats. There were no sustained arrhythmias or prolonged pauses. Patient triggered events corresponded to sinus rhythm and artifact. In this setting, her Toprol XL was increased to 25 mg. She underwent echo on 02/19/2019 which showed improvement in her EF to 44-31%, normal diastolic function, and no significant valvular abnormalities. She was last seen in the office in 02/2019 for presyncope. Repeat cardiac monitoring at that time showed a predominant rhythm of sinus with an average heart rate of 88 bpm (range 47-181 bpm in sinus), 2 episodes of WCT favored to be NSVT over SVT with aberrancy lasting up to 7 beats, and rare PACs/PVCs. She has continued to undergo periodic echoes for chemotherapy evaluation as outlined below.   Most recent echo, performed for chemotherapy evaluation and sinus tachycardia of 102 bpm at the oncology clinic showed a normal LVSF with an EF of 55-60%, no RWMA, normal RV systolic function and ventricular cavity size, and no significant valvular abnormalities.   She comes in doing well from a cardiac perspective.  No exertional angina or dyspnea.  She does note some shortness of breath associated with the heat.  She does continue to note randomly occurring brief tachypalpitations that are unchanged from prior.  No dizziness, presyncope, or syncope.  No lower extremity swelling.  Overall, she is doing well from a cardiac perspective and does not have any issues or concerns at this time.   Labs independently reviewed: 05/2021 - HGB 11.1, PLT 299, potassium 3.3, BUN 18, SCr  0.78, albumin 4.2, AST/ALT normal, magnesium 2.0 04/2021 - TSH normal  Past Medical History:  Diagnosis Date   Anemia    Arthritis    BRCA negative 11/26/2017   Breast cancer ([place]) 10/11/2017   Multifocal, ER positive, PR negative, HER-2 negative. ypT3 ypN2a 8.7 cm, 4/15 nodes   Cardiomyopathy ([facility])    a. 10/2017 Echo: EF  60-65%, no rwma, Gr1 DD, nl RV size/fxn; b. 04/2018 Echo: EF 55-60%, no rwma, Nl RV size/fxn; c. 08/2018 Echo: EF 45%, diff HK, ? HK of antsept wall. Gr1 DD. Mild MR. Mild LAE/RAE. Mod dil RV.    Chronic bronchitis ([place]) 11/2017   COPD (chronic obstructive pulmonary disease) ([facility])    MILD PER CXR   Depression    Family history of cancer    GERD (gastroesophageal reflux disease)    Headache    MIGRAINES   Heart murmur    ASYMPTOMATIC   Personal history of chemotherapy    current for right breast ca    Past Surgical History:  Procedure Laterality Date   AXILLARY LYMPH NODE DISSECTION Right 03/19/2018   Procedure: AXILLARY LYMPH NODE DISSECTION;  Surgeon: Robert Bellow, MD;  Location: [facility];  Service: General;  Laterality: Right;   BREAST BIOPSY Right 10/11/2017   12:30 posterior coil clip invasive mammary carcinoma   BREAST BIOPSY Right 10/11/2017   11:30 middle depth ribbon clip DCIS   BREAST BIOPSY Right 10/11/2017   5:30 anterior depth x shape invasive ductal carcinoma   BREAST IMPLANT REMOVAL Right 06/03/2019   Procedure: REMOVAL OF RIGHT BREAST IMPLANTS;  Surgeon: Wallace Going, DO;  Location: [facility];  Service: Plastics;  Laterality: Right;   BREAST RECONSTRUCTION WITH PLACEMENT OF TISSUE EXPANDER AND FLEX HD (ACELLULAR HYDRATED DERMIS) Right 03/19/2018   Procedure: BREAST RECONSTRUCTION WITH PLACEMENT OF TISSUE EXPANDER AND FLEX HD (ACELLULAR HYDRATED DERMIS);  Surgeon: Wallace Going, DO;  Location: [facility];  Service: Plastics;  Laterality: Right;   CARPAL TUNNEL RELEASE Bilateral 2020   CHOLECYSTECTOMY N/A 04/27/2020   Procedure: LAPAROSCOPIC CHOLECYSTECTOMY WITH INTRAOPERATIVE CHOLANGIOGRAM;  Surgeon: Robert Bellow, MD;  Location: [facility];  Service: General;  Laterality: N/A;   ESOPHAGOGASTRODUODENOSCOPY (EGD) WITH PROPOFOL N/A 04/17/2020   Procedure: ESOPHAGOGASTRODUODENOSCOPY (EGD) WITH PROPOFOL;  Surgeon: Robert Bellow, MD;  Location: [facility];  Service: Endoscopy;  Laterality: N/A;  with biopsy   LAPAROSCOPIC BILATERAL SALPINGO OOPHERECTOMY Bilateral 03/19/2018   Procedure: LAPAROSCOPIC BILATERAL SALPINGO OOPHORECTOMY;  Surgeon: Benjaman Kindler, MD;  Location: [facility];  Service: Gynecology;  Laterality: Bilateral;   MASTECTOMY Right 03/2018   MASTECTOMY W/ SENTINEL NODE BIOPSY Right 03/19/2018   Procedure: MASTECTOMY WITH SENTINEL LYMPH NODE BIOPSY;  Surgeon: Robert Bellow, MD;  Location: [facility];  Service: General;  Laterality: Right;   PORT-A-CATH REMOVAL Left 06/03/2019   Procedure: REMOVAL PORT-A-CATH;  Surgeon: Robert Bellow, MD;  Location: [facility];  Service: General;  Laterality: Left;   PORTACATH PLACEMENT Left 10/24/2017   Procedure: INSERTION PORT-A-CATH;  Surgeon: Robert Bellow, MD;  Location: [facility];  Service: General;  Laterality: Left;   PORTACATH PLACEMENT Right 09/28/2020   Procedure: INSERTION PORT-A-CATH;  Surgeon: Robert Bellow, MD;  Location: [facility];  Service: General;  Laterality: Right;   REMOVAL OF TISSUE EXPANDER AND PLACEMENT OF IMPLANT Right 07/20/2018   Procedure: REMOVAL OF RIGHT BREAST TISSUE EXPANDER AND PLACEMENT OF IMPLANT;  Surgeon: Wallace Going, DO;  Location: [facility];  Service: Plastics;  Laterality:  Right;   SIMPLE MASTECTOMY WITH AXILLARY SENTINEL NODE BIOPSY Left 06/03/2019   Procedure: SIMPLE MASTECTOMY LEFT;  Surgeon: Robert Bellow, MD;  Location: [facility];  Service: General;  Laterality: Left;    Current Medications: Current Meds  Medication Sig   acetaminophen (TYLENOL) 500 MG tablet Take 500 mg by mouth every 6 (six) hours as needed.   albuterol (VENTOLIN HFA) 108 (90 Base) MCG/ACT inhaler Inhale 2 puffs into the lungs every 6 (six) hours as needed for wheezing or shortness of breath.    Calcium Carbonate-Vitamin D 600-400 MG-UNIT tablet Take 3 tablets by mouth daily.   cholecalciferol (VITAMIN D3) 25 MCG (1000 UNIT) tablet Take  1 tablet (1,000 Units total) by mouth daily.   cyanocobalamin (,VITAMIN B-12,) 1000 MCG/ML injection Inject 1,000 mcg into the skin every 30 (thirty) days.   cyclobenzaprine (FLEXERIL) 5 MG tablet Take 5 mg by mouth 3 (three) times daily as needed for muscle spasms.    diphenoxylate-atropine (LOMOTIL) 2.5-0.025 MG tablet Take 1 tablet by mouth 4 (four) times daily as needed for diarrhea or loose stools.   esomeprazole (NEXIUM) 40 MG capsule Take 40 mg by mouth daily before breakfast.    Eszopiclone 3 MG TABS Take 3 mg by mouth at bedtime as needed (sleep).    folic acid (FOLVITE) 1 MG tablet Take 1 mg by mouth daily.   ibuprofen (ADVIL) 800 MG tablet Take 800 mg by mouth every 8 (eight) hours as needed for moderate pain.   lidocaine-prilocaine (EMLA) cream Apply to port and cover 1-2 hours prior to appointment   loperamide (IMODIUM) 2 MG capsule Take 2 tablets with onset of diarrhea, then take 1 tablet every 2 hours until diarrhea stops. Maximum 8 tablets in 24hours   loratadine (CLARITIN) 10 MG tablet Take 10 mg by mouth daily.    LORazepam (ATIVAN) 1 MG tablet Take 1 mg by mouth 3 (three) times daily.   Magnesium 400 MG TABS Take 400 mg by mouth 2 (two) times daily.   meloxicam (MOBIC) 7.5 MG tablet Take 7.5 mg by mouth 2 (two) times daily as needed (pain/inflammation.).    methotrexate (RHEUMATREX) 2.5 MG tablet Take 25 mg by mouth every Sunday. 10 tablets once a week   metoprolol succinate (TOPROL-XL) 25 MG 24 hr tablet Take 37.5 mg by mouth every morning.    mupirocin ointment (BACTROBAN) 2 % Place 1 application into the nose 2 (two) times daily. Use in each nostril twice daily for five (5) days.   nortriptyline (PAMELOR) 10 MG capsule Take 30 mg by mouth at bedtime.    oxyCODONE (OXY IR/ROXICODONE) 5 MG immediate release tablet Take 1 tablet (5 mg total) by mouth every 6 (six) hours as needed for moderate pain or severe pain.   phentermine (ADIPEX-P) 37.5 MG tablet Take 37.5 mg by mouth  daily before breakfast.    pregabalin (LYRICA) 150 MG capsule Take 150 mg by mouth 2 (two) times daily.   promethazine (PHENERGAN) 25 MG tablet Take 1 tablet (25 mg total) by mouth every 8 (eight) hours as needed for nausea or vomiting.   pyridOXINE (VITAMIN B-6) 100 MG tablet Take 100 mg by mouth daily.   topiramate (TOPAMAX) 50 MG tablet Take 50 mg by mouth 2 (two) times daily.    vitamin C (ASCORBIC ACID) 500 MG tablet Take 500 mg by mouth daily.    Allergies:   Patient has no known allergies.   Social History   Socioeconomic History   Marital  status: Married    Spouse name: Not on file   Number of children: Not on file   Years of education: Not on file   Highest education level: Not on file  Occupational History   Occupation: pharmacy tech    Comment: [employer]   Tobacco Use   Smoking status: Every Day    Packs/day: 0.50    Years: 18.00    Pack years: 9.00    Types: Cigarettes    Start date: 06/21/2018   Smokeless tobacco: Never  Vaping Use   Vaping Use: Never used  Substance and Sexual Activity   Alcohol use: No   Drug use: No   Sexual activity: Yes    Birth control/protection: Injection, Other-see comments    Comment: has had hysterectomy  Other Topics Concern   Not on file  Social History Narrative   Lives at home with husband and daughter   Social Determinants of Health   Financial Resource Strain: Not on file  Food Insecurity: Not on file  Transportation Needs: Not on file  Physical Activity: Not on file  Stress: Not on file  Social Connections: Not on file     Family History:  The patient's family history includes Bladder Cancer in her maternal grandmother; Cervical cancer (age of onset: 66) in her maternal aunt; Diabetes in her father; Heart attack (age of onset: 82) in her father; Hyperlipidemia in her father; Hypertension in her father; Melanoma in her maternal aunt and maternal uncle.  ROS:   Review of Systems   Constitutional:  Positive for malaise/fatigue. Negative for chills, diaphoresis, fever and weight loss.  HENT:  Negative for congestion.   Eyes:  Negative for discharge and redness.  Respiratory:  Positive for shortness of breath. Negative for cough, hemoptysis, sputum production and wheezing.   Cardiovascular:  Positive for chest pain and palpitations. Negative for orthopnea, claudication, leg swelling and PND.  Gastrointestinal:  Negative for abdominal pain, heartburn, nausea and vomiting.  Musculoskeletal:  Negative for falls and myalgias.  Skin:  Negative for rash.  Neurological:  Negative for dizziness, tingling, tremors, sensory change, speech change, focal weakness, loss of consciousness and weakness.  Endo/Heme/Allergies:  Does not bruise/bleed easily.  Psychiatric/Behavioral:  Negative for substance abuse. The patient is not nervous/anxious.   All other systems reviewed and are negative.   EKGs/Labs/Other Studies Reviewed:    Studies reviewed were summarized above. The additional studies were reviewed today:  Limited 2D echo 05/05/2021: 1. Left ventricular ejection fraction, by estimation, is 55 to 60%. The  left ventricle has normal function. The left ventricle has no regional  wall motion abnormalities.   2. Right ventricular systolic function is normal. The right ventricular  size is normal.   3. The mitral valve is normal in structure. No evidence of mitral valve  regurgitation.   4. The aortic valve is tricuspid. Aortic valve regurgitation is not  visualized.  __________  Limited 2D echo 01/29/2021:  1. Left ventricular ejection fraction, by estimation, is 55 to 60%. The  left ventricle has normal function. The left ventricle has no regional  wall motion abnormalities. Left ventricular diastolic function could not  be evaluated.   2. Right ventricular systolic function is normal. The right ventricular  size is normal.   3. The mitral valve is normal in structure.  Trivial mitral valve  regurgitation.   4. The aortic valve is normal in structure. Aortic valve regurgitation is  not visualized.  __________  Limited  2D echo 09/14/2020: 1. Left ventricular ejection fraction, by estimation, is 60 to 65%. Left  ventricular ejection fraction by PLAX is 68 %. The left ventricle has  normal function. Left ventricular diastolic parameters are consistent with  Grade I diastolic dysfunction  (impaired relaxation).   2. Right ventricular systolic function is normal. The right ventricular  size is normal.   3. The mitral valve was not well visualized. No evidence of mitral valve  regurgitation.   4. Aortic valve regurgitation is not visualized.  __________  2D echo 01/29/2020: 1. Left ventricular ejection fraction, by estimation, is 60 to 65%. The  left ventricle has normal function. The left ventricle has no regional  wall motion abnormalities. Indeterminate diastolic filling due to E-A  fusion.   2. Right ventricular systolic function is normal. The right ventricular  size is normal.   3. The mitral valve is normal in structure and function. No evidence of  mitral valve regurgitation. No evidence of mitral stenosis.   4. The aortic valve is normal in structure and function. Aortic valve  regurgitation is not visualized. No aortic stenosis is present.  __________  Elwyn Reach patch 02/2019: The patient was monitored for 7 days. The predominant rhythm was sinus with an average rate of 88 bpm (range 47 to 181 bpm in sinus). Rare PAC's and PVC's were noted. Two episodes of wide-complex tachycardia (favor nonsustained ventricular tachycardia over supraventricular tachycardia with aberrancy) occurred, lasting up to 7 beats. Patient triggered events correspond to normal sinus rhythm and narrow complex tachycardia (favor sinus tachycardia).   Predominantly sinus rhythm with two brief episodes of wide-complex tachycardia (favor NSVT over SVT with aberrancy).  Rare PAC's  and PVC's also noted.  Patient triggered events correspond to sinus rhythm and narrow complex tachycardia (favor sinus tachycardia but cannot exclude atrial tachycardia). __________  Limited 2D echo 02/19/2019: 1. The left ventricle has normal systolic function, with an ejection  fraction of 55-60%. Left ventricular diastolic parameters were normal.   2. The right ventricle has normal systolc function. The cavity was  normal. There is no increase in right ventricular wall thickness.   3. The aortic valve was not well visualized. __________  Elwyn Reach patch 11/2018: The patient was monitored for 13 days, 8 hours. The predominant rhythm was sinus with an average rate of 82 bpm (range 48 to 175 bpm). Rare PACs and PVCs were noted. A single atrial run lasting 4 beats was noted. There was no sustained arrhythmia or prolonged pause. Patient triggered events correspond to sinus rhythm and artifact.   Predominantly sinus rhythm with rare PACs and PVCs.  Brief atrial run lasting 4 beats noted. __________  Carlton Adam MPI 10/12/2018: T wave inversion was noted during stress in the III, II, aVF, V3, V2, V4, V5 and V6 leads, and returning to baseline after 1-5 mins of recovery. There was no ST segment deviation noted during stress. Defect 1: There is a medium defect of moderate severity present in the basal anterior and mid anterior location. The left ventricular ejection fraction is normal (55-65%). Very suboptimal study due to breast attenuation artifact as well as extracardiac uptake at the inferior border of the heart. There is a reversible anterior wall defect suggestive of ischemia. However, I suspect that this is likely artifact given that the defect does not extend to the apex. Correlate clinically. __________  2D echo 08/15/2018: - Left ventricle: The cavity size was mildly dilated. Systolic    function was mildly reduced. The estimated ejection fraction  was    45%. Diffuse hypokinesis. Hypokinesis  of the anteroseptal    myocardium. Doppler parameters are consistent with abnormal left    ventricular relaxation (grade 1 diastolic dysfunction).  - Aortic valve: Valve area (VTI): 1.88 cm^2. Valve area (Vmax): 1.8    cm^2. Valve area (Vmean): 1.74 cm^2.  - Mitral valve: There was mild regurgitation. Valve area by    continuity equation (using LVOT flow): 2.22 cm^2.  - Left atrium: The atrium was mildly dilated.  - Right ventricle: The cavity size was moderately dilated.  - Right atrium: The atrium was mildly dilated.   Impressions:   - Mild LV systolic dysfunction with wall motion abnormalities    suggestive of CAD.  __________  2D echo 04/26/2018: - Left ventricle: The cavity size was normal. Wall thickness was    normal. Systolic function was normal. The estimated ejection    fraction was in the range of 55% to 60%. Wall motion was normal;    there were no regional wall motion abnormalities. Left    ventricular diastolic function parameters were normal.  - Pulmonary arteries: Systolic pressure could not be accurately    estimated.  __________  2D echo 10/26/2017: - Left ventricle: The cavity size was normal. Systolic function was    normal. The estimated ejection fraction was in the range of 60%    to 65%. Wall motion was normal; there were no regional wall    motion abnormalities. Doppler parameters are consistent with    abnormal left ventricular relaxation (grade 1 diastolic    dysfunction).  - Left atrium: The atrium was normal in size.  - Right ventricle: Systolic function was normal.  - Pulmonary arteries: Systolic pressure was within the normal    range.    EKG:  EKG is ordered today.  The EKG ordered today demonstrates NSR, 91 bpm, no acute ST-T changes  Recent Labs: 04/15/2021: TSH 2.732 05/28/2021: ALT 20; BUN 18; Creatinine, Ser 0.78; Hemoglobin 11.1; Magnesium 2.0; Platelets 299; Potassium 3.3; Sodium 136  Recent Lipid Panel No results found for: CHOL, TRIG,  HDL, CHOLHDL, VLDL, LDLCALC, LDLDIRECT  PHYSICAL EXAM:    VS:  BP 110/70 (BP Location: Left Arm, Patient Position: Sitting, Cuff Size: Normal)   Pulse 91   Ht 5' 7"  (1.702 m)   Wt 189 lb 6 oz (85.9 kg)   LMP  (LMP Unknown) Comment: Ovaries and tubes removed.April 2019  SpO2 99%   BMI 29.66 kg/m   BMI: Body mass index is 29.66 kg/m.  Physical Exam Vitals reviewed.  Constitutional:      Appearance: She is well-developed.  HENT:     Head: Normocephalic and atraumatic.  Eyes:     General:        Right eye: No discharge.        Left eye: No discharge.  Neck:     Vascular: No JVD.  Cardiovascular:     Rate and Rhythm: Normal rate and regular rhythm.     Pulses:          Posterior tibial pulses are 2+ on the right side and 2+ on the left side.     Heart sounds: Normal heart sounds, S1 normal and S2 normal. Heart sounds not distant. No midsystolic click and no opening snap. No murmur heard.   No friction rub.  Pulmonary:     Effort: Pulmonary effort is normal. No respiratory distress.     Breath sounds: Normal breath sounds. No decreased breath sounds,  wheezing or rales.  Chest:     Chest wall: No tenderness.  Abdominal:     General: There is no distension.     Palpations: Abdomen is soft.     Tenderness: There is no abdominal tenderness.  Musculoskeletal:     Cervical back: Normal range of motion.  Skin:    General: Skin is warm and dry.     Nails: There is no clubbing.  Neurological:     Mental Status: She is alert and oriented to person, place, and time.  Psychiatric:        Speech: Speech normal.        Behavior: Behavior normal.        Thought Content: Thought content normal.        Judgment: Judgment normal.    Wt Readings from Last 3 Encounters:  06/15/21 189 lb 6 oz (85.9 kg)  05/28/21 190 lb (86.2 kg)  05/07/21 190 lb 3.2 oz (86.3 kg)     ASSESSMENT & PLAN:   Systolic dysfunction/NICM: She is doing well without any symptoms of volume overload or  decompensation.  Echo continues to demonstrate preserved LV systolic function.  Continue Toprol-XL.  WCT/paroxysmal SVT/palpitations: Symptoms are overall stable.  Should they become more frequent or longer lasting could consider as needed Lopressor.  She remains on Toprol-XL as above.  Hypokalemia: Repleted by oncology.  Anemia: Stable with management per oncology.  Recurrent breast cancer with isolated bone metastasis: Management per oncology.  Disposition: F/u with Dr. Saunders Revel or an APP in 6 months, sooner if needed.   Medication Adjustments/Labs and Tests Ordered: Current medicines are reviewed at length with the patient today.  Concerns regarding medicines are outlined above. Medication changes, Labs and Tests ordered today are summarized above and listed in the Patient Instructions accessible in Encounters.   Signed, Christell Faith, PA-C 06/15/2021 9:02 AM     [facility] [address] [place] [place], [place] [address] [phone]

## 2021-06-15 ENCOUNTER — Ambulatory Visit (INDEPENDENT_AMBULATORY_CARE_PROVIDER_SITE_OTHER): Payer: Medicare HMO | Admitting: Physician Assistant

## 2021-06-15 ENCOUNTER — Other Ambulatory Visit: Payer: Self-pay

## 2021-06-15 ENCOUNTER — Encounter: Payer: Self-pay | Admitting: Physician Assistant

## 2021-06-15 VITALS — BP 110/70 | HR 91 | Ht 67.0 in | Wt 189.4 lb

## 2021-06-15 DIAGNOSIS — Z170 Estrogen receptor positive status [ER+]: Secondary | ICD-10-CM

## 2021-06-15 DIAGNOSIS — C50411 Malignant neoplasm of upper-outer quadrant of right female breast: Secondary | ICD-10-CM

## 2021-06-15 DIAGNOSIS — I428 Other cardiomyopathies: Secondary | ICD-10-CM | POA: Diagnosis not present

## 2021-06-15 DIAGNOSIS — R000 Tachycardia, unspecified: Secondary | ICD-10-CM

## 2021-06-15 DIAGNOSIS — E876 Hypokalemia: Secondary | ICD-10-CM

## 2021-06-15 DIAGNOSIS — I471 Supraventricular tachycardia: Secondary | ICD-10-CM | POA: Diagnosis not present

## 2021-06-15 DIAGNOSIS — I472 Ventricular tachycardia: Secondary | ICD-10-CM

## 2021-06-15 DIAGNOSIS — D649 Anemia, unspecified: Secondary | ICD-10-CM

## 2021-06-15 NOTE — Patient Instructions (Signed)
Medication Instructions:  ?No changes at this time.  ? ?*If you need a refill on your cardiac medications before your next appointment, please call your pharmacy* ? ? ?Lab Work: ?None ? ?If you have labs (blood work) drawn today and your tests are completely normal, you will receive your results only by: ?MyChart Message (if you have MyChart) OR ?A paper copy in the mail ?If you have any lab test that is abnormal or we need to change your treatment, we will call you to review the results. ? ? ?Testing/Procedures: ?None ? ? ?Follow-Up: ?At [facility], you and your health needs are our priority.  As part of our continuing mission to provide you with exceptional heart care, we have created designated Provider Care Teams.  These Care Teams include your primary Cardiologist (physician) and Advanced Practice Providers (APPs -  Physician Assistants and Nurse Practitioners) who all work together to provide you with the care you need, when you need it. ? ? ?Your next appointment:   ?6 month(s) ? ?The format for your next appointment:   ?In Person ? ?Provider:   ?Christopher End, MD or Ryan Dunn, PA-C ?

## 2021-06-17 ENCOUNTER — Inpatient Hospital Stay: Payer: Medicare HMO

## 2021-06-17 ENCOUNTER — Other Ambulatory Visit: Payer: Self-pay | Admitting: *Deleted

## 2021-06-18 ENCOUNTER — Inpatient Hospital Stay: Payer: Medicare HMO | Attending: Oncology

## 2021-06-18 ENCOUNTER — Inpatient Hospital Stay (HOSPITAL_BASED_OUTPATIENT_CLINIC_OR_DEPARTMENT_OTHER): Payer: Medicare HMO | Admitting: Oncology

## 2021-06-18 ENCOUNTER — Inpatient Hospital Stay: Payer: Medicare HMO

## 2021-06-18 ENCOUNTER — Encounter: Payer: Self-pay | Admitting: Oncology

## 2021-06-18 VITALS — BP 105/73 | HR 79 | Temp 97.9°F | Resp 18 | Wt 189.0 lb

## 2021-06-18 DIAGNOSIS — Z5111 Encounter for antineoplastic chemotherapy: Secondary | ICD-10-CM | POA: Diagnosis not present

## 2021-06-18 DIAGNOSIS — Z5112 Encounter for antineoplastic immunotherapy: Secondary | ICD-10-CM

## 2021-06-18 DIAGNOSIS — C50411 Malignant neoplasm of upper-outer quadrant of right female breast: Secondary | ICD-10-CM

## 2021-06-18 DIAGNOSIS — C7951 Secondary malignant neoplasm of bone: Secondary | ICD-10-CM

## 2021-06-18 DIAGNOSIS — C50919 Malignant neoplasm of unspecified site of unspecified female breast: Secondary | ICD-10-CM | POA: Diagnosis not present

## 2021-06-18 DIAGNOSIS — Z79899 Other long term (current) drug therapy: Secondary | ICD-10-CM | POA: Diagnosis not present

## 2021-06-18 DIAGNOSIS — C50811 Malignant neoplasm of overlapping sites of right female breast: Secondary | ICD-10-CM

## 2021-06-18 LAB — CBC WITH DIFFERENTIAL/PLATELET
Abs Immature Granulocytes: 0.02 10*3/uL (ref 0.00–0.07)
Basophils Absolute: 0 10*3/uL (ref 0.0–0.1)
Basophils Relative: 1 %
Eosinophils Absolute: 0.1 10*3/uL (ref 0.0–0.5)
Eosinophils Relative: 2 %
HCT: 32.5 % — ABNORMAL LOW (ref 36.0–46.0)
Hemoglobin: 10.6 g/dL — ABNORMAL LOW (ref 12.0–15.0)
Immature Granulocytes: 0 %
Lymphocytes Relative: 27 %
Lymphs Abs: 2 10*3/uL (ref 0.7–4.0)
MCH: 29.5 pg (ref 26.0–34.0)
MCHC: 32.6 g/dL (ref 30.0–36.0)
MCV: 90.5 fL (ref 80.0–100.0)
Monocytes Absolute: 0.8 10*3/uL (ref 0.1–1.0)
Monocytes Relative: 11 %
Neutro Abs: 4.4 10*3/uL (ref 1.7–7.7)
Neutrophils Relative %: 59 %
Platelets: 281 10*3/uL (ref 150–400)
RBC: 3.59 MIL/uL — ABNORMAL LOW (ref 3.87–5.11)
RDW: 13.5 % (ref 11.5–15.5)
WBC: 7.4 10*3/uL (ref 4.0–10.5)
nRBC: 0 % (ref 0.0–0.2)

## 2021-06-18 LAB — COMPREHENSIVE METABOLIC PANEL
ALT: 20 U/L (ref 0–44)
AST: 29 U/L (ref 15–41)
Albumin: 4.2 g/dL (ref 3.5–5.0)
Alkaline Phosphatase: 80 U/L (ref 38–126)
Anion gap: 8 (ref 5–15)
BUN: 19 mg/dL (ref 6–20)
CO2: 23 mmol/L (ref 22–32)
Calcium: 9 mg/dL (ref 8.9–10.3)
Chloride: 107 mmol/L (ref 98–111)
Creatinine, Ser: 1.05 mg/dL — ABNORMAL HIGH (ref 0.44–1.00)
GFR, Estimated: >60 mL/min (ref >60)
Glucose, Bld: 122 mg/dL — ABNORMAL HIGH (ref 70–99)
Potassium: 3.3 mmol/L — ABNORMAL LOW (ref 3.5–5.1)
Sodium: 138 mmol/L (ref 135–145)
Total Bilirubin: 0.7 mg/dL (ref 0.3–1.2)
Total Protein: 7.1 g/dL (ref 6.5–8.1)

## 2021-06-18 LAB — MAGNESIUM: Magnesium: 2.1 mg/dL (ref 1.7–2.4)

## 2021-06-18 MED ORDER — HEPARIN SOD (PORK) LOCK FLUSH 100 UNIT/ML IV SOLN
500.0000 [IU] | Freq: Once | INTRAVENOUS | Status: AC | PRN
  Administered 2021-06-18: 500 [IU]
  Filled 2021-06-18: qty 5

## 2021-06-18 MED ORDER — FULVESTRANT 250 MG/5ML IM SOLN
500.0000 mg | INTRAMUSCULAR | Status: DC
  Administered 2021-06-18: 500 mg via INTRAMUSCULAR
  Filled 2021-06-18: qty 10

## 2021-06-18 MED ORDER — SODIUM CHLORIDE 0.9% FLUSH
10.0000 mL | Freq: Once | INTRAVENOUS | Status: AC
Start: 2021-06-18 — End: 2021-06-18
  Administered 2021-06-18: 10 mL via INTRAVENOUS
  Filled 2021-06-18: qty 10

## 2021-06-18 MED ORDER — HEPARIN SOD (PORK) LOCK FLUSH 100 UNIT/ML IV SOLN
INTRAVENOUS | Status: AC
  Filled 2021-06-18: qty 5

## 2021-06-18 MED ORDER — SODIUM CHLORIDE 0.9 % IV SOLN
Freq: Once | INTRAVENOUS | Status: AC
  Filled 2021-06-18: qty 250

## 2021-06-18 MED ORDER — SODIUM CHLORIDE 0.9 % IV SOLN
420.0000 mg | Freq: Once | INTRAVENOUS | Status: AC
  Administered 2021-06-18: 420 mg via INTRAVENOUS
  Filled 2021-06-18: qty 14

## 2021-06-18 MED ORDER — ACETAMINOPHEN 325 MG PO TABS
650.0000 mg | ORAL_TABLET | Freq: Once | ORAL | Status: AC
  Administered 2021-06-18: 650 mg via ORAL
  Filled 2021-06-18: qty 2

## 2021-06-18 MED ORDER — POTASSIUM CHLORIDE IN NACL 20-0.9 MEQ/L-% IV SOLN
Freq: Once | INTRAVENOUS | Status: AC
  Filled 2021-06-18: qty 1000

## 2021-06-18 MED ORDER — DIPHENHYDRAMINE HCL 25 MG PO CAPS
50.0000 mg | ORAL_CAPSULE | Freq: Once | ORAL | Status: AC
  Administered 2021-06-18: 50 mg via ORAL
  Filled 2021-06-18: qty 2

## 2021-06-18 MED ORDER — TRASTUZUMAB-ANNS CHEMO 150 MG IV SOLR
6.0000 mg/kg | Freq: Once | INTRAVENOUS | Status: AC
  Administered 2021-06-18: 504 mg via INTRAVENOUS
  Filled 2021-06-18: qty 24

## 2021-06-18 MED ORDER — ZOLEDRONIC ACID 4 MG/100ML IV SOLN
4.0000 mg | Freq: Once | INTRAVENOUS | Status: AC
  Administered 2021-06-18: 4 mg via INTRAVENOUS
  Filled 2021-06-18: qty 100

## 2021-06-18 MED ORDER — POTASSIUM CHLORIDE IN NACL 20-0.9 MEQ/L-% IV SOLN
Freq: Once | INTRAVENOUS | Status: DC
  Filled 2021-06-18: qty 1000

## 2021-06-18 NOTE — Progress Notes (Signed)
Pt here for follow up. Reports new redness and tenderness to right eye, that started yesterday.

## 2021-06-18 NOTE — Progress Notes (Signed)
Hematology/Oncology Follow Up Note [place] [phone] [phone]  Patient Care Team: Marguerita Merles, MD as PCP - General (Family Medicine) End, Harrell Gave, MD as PCP - Cardiology (Cardiology) Rico Junker, RN as Oncology Nurse Navigator Earlie Server, MD as Consulting Physician (Oncology) Bary Castilla, Forest Gleason, MD (General Surgery) Noreene Filbert, MD as Referring Physician (Radiation Oncology) Noreene Filbert, MD as Radiation Oncologist (Radiation Oncology) Noreene Filbert, MD as Radiation Oncologist (Radiation Oncology)   Name of the patient: Theresa Chaney  [id number]  12-31-1980   Date of visit: 06/18/21 REASON FOR VISIT Follow up for Assessment prior to  treatment of breast cancer  Oncology History Cancer TREATMENT Neoadjuvant ddAC +one dose of Taxol, due to lack of response, surgery was offered. Case was discussed on breast tumor board. 03/19/2018 S/p right mastectomy and right axillary dissection, immediate breast reconstruction with placement of expanders.  ypT3 ypN2, + lymphovascular invasion,  Grade 3, margin is negative, close. ER 90%, PR 0%, HER2 IHC negative.  Also had elective bilateral salpingo-oophorectomy..  # 06/11/2018 s/p 11 cycles Taxol adjuvantly. Tolerated well.  # She has obtained dental clearance for starting Zometa.  S/p Zometa on 6/3/ 2019 # s/p adjuvant right chest wall radiation, finished 10/10/2018 #06/03/2019 underwent elective left prophylactic mastectomy and sentinel lymph node biopsy of left axilla.  # 06/03/2019 underwent elective left prophylactic mastectomy and sentinel lymph node biopsy of left axilla. Pathology negative for malignancy. Status post Mediport removal on 06/03/2019. She also underwent right implant removal on 06/03/2019. She also underwent right implant removal on 06/03/2019. #Negative genetic testing  #  chemotherapy-induced neuropathy, bilateral fingertips and lower extremities.  Patient is  on Lyrica and nortriptyline.  Follows up with neurology. She follows up with lymphedema clinic.  #  07/07/2020, MRI thoracic spine without contrast showed lesions involving the T7 posterior elements most concerning for metastatic lesion.  No evidence of epidural tumor.  Minimal thoracic spondylosis without stenosis. MRI was reviewed by me and a PET scan was obtained for further evaluation. 07/20/2020, PET scan showed hypermetabolic metastasis involving the posterior element of T7, no additional evidence of metastasis in the neck, chest, abdomen or pelvis.  # 07/29/2020 T7 lesion biopsy showed metastatic carcinoma, compatible with breast origin.  Receptor status staining showed ER 71-80% positive, PR negative, HER-2 positive IHC 3+ # Patient finished spine radiation on 08/31/2020   positive for COVID-19 on 01/11/2021  # 03/15/2021, PET scan showed no focal hypermetabolic activity to suggest skeletal metastasis.  Mild hypermetabolic activity along the right T7-8 paraspinal musculature.  Max SUV 3.3.  Likely postprocedural-  #Lower extremity cramps when she eats banana or oral potassium supplementation.not able to tolerate po potassium.   INTERVAL HISTORY 40 yo female with above oncology history reviewed by me presents for follow-up of management of metastatic breast cancer.  Chronic intermittent back pain, ongoing issue for her. She takes pain medication if needed.  Denies any palpitation or chest pain, shortness of breath..No nausea vomiting. Right eye turned red yesterday, no itching or discharge. No vision changes.  She help her daughter to move yesterday.    Review of Systems  Constitutional:  Negative for chills, fever, malaise/fatigue and weight loss.  HENT:  Negative for sore throat.   Eyes:  Negative for redness.  Respiratory:  Negative for cough, shortness of breath and wheezing.   Cardiovascular:  Negative for chest pain, palpitations and leg swelling.  Gastrointestinal:  Negative for  abdominal pain, blood in stool, heartburn, nausea and  vomiting.  Genitourinary:  Negative for dysuria.  Musculoskeletal:  Positive for back pain and joint pain. Negative for myalgias.  Skin:  Negative for rash.  Neurological:  Positive for tingling. Negative for dizziness and tremors.  Endo/Heme/Allergies:  Does not bruise/bleed easily.  Psychiatric/Behavioral:  Negative for hallucinations.    No Known Allergies  Patient Active Problem List   Diagnosis Date Noted   Tachycardia, unspecified 04/15/2021   Bone metastasis ([place]) 03/25/2021   Encounter for monoclonal antibody treatment for malignancy 12/10/2020   Bone lesion 11/19/2020   Hypocalcemia 11/19/2020   Inflammatory arthritis 11/19/2020   Encounter for antineoplastic chemotherapy 10/29/2020   Metastatic breast cancer ([place]) 08/31/2020   Goals of care, counseling/discussion 08/11/2020   Neuropathy due to chemotherapeutic drug ([place]) 08/11/2020   HER2-positive carcinoma of breast ([place]) 08/11/2020   Rheumatoid arteritis ([place]) 10/13/2019   Bilateral hand swelling 10/03/2019   Fracture of neck of metacarpal bone 05/14/2019   Chronic fatigue 04/09/2019   Polyarthralgia 04/09/2019   Status post right breast reconstruction 02/26/2019   Status post right mastectomy 02/26/2019   Mastalgia 02/15/2019   Shortness of breath 08/23/2018   Nonischemic cardiomyopathy ([place]) 08/23/2018   Preprocedural cardiovascular examination 08/23/2018   Tachycardia 08/23/2018   Palpitations 08/23/2018   Estrogen receptor positive status (ER+) 04/04/2018   Acquired absence of right breast and nipple 04/03/2018   Breast cancer of upper-outer quadrant of right female breast ([place]) 03/19/2018   Family history of cancer    Malignant neoplasm of overlapping sites of right breast in female, estrogen receptor positive ([place]) 10/19/2017   Gastroesophageal reflux disease without esophagitis 02/24/2017   Generalized anxiety disorder 10/03/2014   Headache 10/03/2014      Past Medical History:  Diagnosis Date   Anemia    Arthritis    BRCA negative 11/26/2017   Breast cancer ([place]) 10/11/2017   Multifocal, ER positive, PR negative, HER-2 negative. ypT3 ypN2a 8.7 cm, 4/15 nodes   Cardiomyopathy ([place])    a. 10/2017 Echo: EF 60-65%, no rwma, Gr1 DD, nl RV size/fxn; b. 04/2018 Echo: EF 55-60%, no rwma, Nl RV size/fxn; c. 08/2018 Echo: EF 45%, diff HK, ? HK of antsept wall. Gr1 DD. Mild MR. Mild LAE/RAE. Mod dil RV.    Chronic bronchitis ([place]) 11/2017   COPD (chronic obstructive pulmonary disease) ([facility])    MILD PER CXR   Depression    Family history of cancer    GERD (gastroesophageal reflux disease)    Headache    MIGRAINES   Heart murmur    ASYMPTOMATIC   Personal history of chemotherapy    current for right breast ca     Past Surgical History:  Procedure Laterality Date   AXILLARY LYMPH NODE DISSECTION Right 03/19/2018   Procedure: AXILLARY LYMPH NODE DISSECTION;  Surgeon: Robert Bellow, MD;  Location: [facility];  Service: General;  Laterality: Right;   BREAST BIOPSY Right 10/11/2017   12:30 posterior coil clip invasive mammary carcinoma   BREAST BIOPSY Right 10/11/2017   11:30 middle depth ribbon clip DCIS   BREAST BIOPSY Right 10/11/2017   5:30 anterior depth x shape invasive ductal carcinoma   BREAST IMPLANT REMOVAL Right 06/03/2019   Procedure: REMOVAL OF RIGHT BREAST IMPLANTS;  Surgeon: Wallace Going, DO;  Location: [facility];  Service: Plastics;  Laterality: Right;   BREAST RECONSTRUCTION WITH PLACEMENT OF TISSUE EXPANDER AND FLEX HD (ACELLULAR HYDRATED DERMIS) Right 03/19/2018   Procedure: BREAST RECONSTRUCTION WITH PLACEMENT OF TISSUE EXPANDER AND FLEX HD (ACELLULAR HYDRATED  DERMIS);  Surgeon: Wallace Going, DO;  Location: [facility];  Service: Plastics;  Laterality: Right;   CARPAL TUNNEL RELEASE Bilateral 2020   CHOLECYSTECTOMY N/A 04/27/2020   Procedure: LAPAROSCOPIC CHOLECYSTECTOMY WITH INTRAOPERATIVE CHOLANGIOGRAM;   Surgeon: Robert Bellow, MD;  Location: [facility];  Service: General;  Laterality: N/A;   ESOPHAGOGASTRODUODENOSCOPY (EGD) WITH PROPOFOL N/A 04/17/2020   Procedure: ESOPHAGOGASTRODUODENOSCOPY (EGD) WITH PROPOFOL;  Surgeon: Robert Bellow, MD;  Location: [facility];  Service: Endoscopy;  Laterality: N/A;  with biopsy   LAPAROSCOPIC BILATERAL SALPINGO OOPHERECTOMY Bilateral 03/19/2018   Procedure: LAPAROSCOPIC BILATERAL SALPINGO OOPHORECTOMY;  Surgeon: Benjaman Kindler, MD;  Location: [facility];  Service: Gynecology;  Laterality: Bilateral;   MASTECTOMY Right 03/2018   MASTECTOMY W/ SENTINEL NODE BIOPSY Right 03/19/2018   Procedure: MASTECTOMY WITH SENTINEL LYMPH NODE BIOPSY;  Surgeon: Robert Bellow, MD;  Location: [place] [facility];  Service: General;  Laterality: Right;   PORT-A-CATH REMOVAL Left 06/03/2019   Procedure: REMOVAL PORT-A-CATH;  Surgeon: Robert Bellow, MD;  Location: [place] [facility] [facility];  Service: General;  Laterality: Left;   PORTACATH PLACEMENT Left 10/24/2017   Procedure: INSERTION PORT-A-CATH;  Surgeon: Robert Bellow, MD;  Location: [facility];  Service: General;  Laterality: Left;   PORTACATH PLACEMENT Right 09/28/2020   Procedure: INSERTION PORT-A-CATH;  Surgeon: Robert Bellow, MD;  Location: [facility];  Service: General;  Laterality: Right;   REMOVAL OF TISSUE EXPANDER AND PLACEMENT OF IMPLANT Right 07/20/2018   Procedure: REMOVAL OF RIGHT BREAST TISSUE EXPANDER AND PLACEMENT OF IMPLANT;  Surgeon: Wallace Going, DO;  Location: [place];  Service: Plastics;  Laterality: Right;   SIMPLE MASTECTOMY WITH AXILLARY SENTINEL NODE BIOPSY Left 06/03/2019   Procedure: SIMPLE MASTECTOMY LEFT;  Surgeon: Robert Bellow, MD;  Location: [facility];  Service: General;  Laterality: Left;    Social History   Socioeconomic History   Marital status: Married    Spouse name: Not on file   Number of children: Not on file   Years of education: Not on file   Highest  education level: Not on file  Occupational History   Occupation: pharmacy tech    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 [employer]   Tobacco Use   Smoking status: Every Day    Packs/day: 0.50    Years: 18.00    Pack years: 9.00    Types: Cigarettes    Start date: 06/21/2018   Smokeless tobacco: Never  Vaping Use   Vaping Use: Never used  Substance and Sexual Activity   Alcohol use: No   Drug use: No   Sexual activity: Yes    Birth control/protection: Injection, Other-see comments    Comment: has had hysterectomy  Other Topics Concern   Not on file  Social History Narrative   Lives at home with husband and daughter   Social Determinants of Health   Financial Resource Strain: Not on file  Food Insecurity: Not on file  Transportation Needs: Not on file  Physical Activity: Not on file  Stress: Not on file  Social Connections: Not on file  Intimate Partner Violence: Not on file     Family History  Problem Relation Age of Onset   Melanoma Maternal Aunt        other aunts with BCC/SCC/Melanoma   Diabetes Father    Hypertension Father    Hyperlipidemia Father    Heart attack Father 54       "mild"   Bladder Cancer Maternal Grandmother  Cervical cancer Maternal Aunt 8       daughter w/ cervical cancer as well   Melanoma Maternal Uncle        other uncles with BCC/SCC/Melanoma     Current Outpatient Medications:    acetaminophen (TYLENOL) 500 MG tablet, Take 500 mg by mouth every 6 (six) hours as needed., Disp: , Rfl:    albuterol (VENTOLIN HFA) 108 (90 Base) MCG/ACT inhaler, Inhale 2 puffs into the lungs every 6 (six) hours as needed for wheezing or shortness of breath. , Disp: , Rfl:    Calcium Carbonate-Vitamin D 600-400 MG-UNIT tablet, Take 3 tablets by mouth daily., Disp: , Rfl:    cholecalciferol (VITAMIN D3) 25 MCG (1000 UNIT) tablet, Take 1 tablet (1,000 Units total) by mouth daily., Disp: 90 tablet, Rfl: 0   cyanocobalamin (,VITAMIN B-12,) 1000 MCG/ML  injection, Inject 1,000 mcg into the skin every 30 (thirty) days., Disp: , Rfl:    cyclobenzaprine (FLEXERIL) 5 MG tablet, Take 5 mg by mouth 3 (three) times daily as needed for muscle spasms. , Disp: , Rfl:    diphenoxylate-atropine (LOMOTIL) 2.5-0.025 MG tablet, Take 1 tablet by mouth 4 (four) times daily as needed for diarrhea or loose stools., Disp: 60 tablet, Rfl: 0   esomeprazole (NEXIUM) 40 MG capsule, Take 40 mg by mouth daily before breakfast. , Disp: , Rfl:    Eszopiclone 3 MG TABS, Take 3 mg by mouth at bedtime as needed (sleep). , Disp: , Rfl:    folic acid (FOLVITE) 1 MG tablet, Take 1 mg by mouth daily., Disp: , Rfl:    hydroxychloroquine (PLAQUENIL) 200 MG tablet, Take 200 mg by mouth daily., Disp: , Rfl:    ibuprofen (ADVIL) 800 MG tablet, Take 800 mg by mouth every 8 (eight) hours as needed for moderate pain., Disp: , Rfl:    lidocaine-prilocaine (EMLA) cream, Apply to port and cover 1-2 hours prior to appointment, Disp: 30 g, Rfl: 3   loperamide (IMODIUM) 2 MG capsule, Take 2 tablets with onset of diarrhea, then take 1 tablet every 2 hours until diarrhea stops. Maximum 8 tablets in 24hours, Disp: 30 capsule, Rfl: 3   loratadine (CLARITIN) 10 MG tablet, Take 10 mg by mouth daily. , Disp: , Rfl:    LORazepam (ATIVAN) 1 MG tablet, Take 1 mg by mouth 3 (three) times daily., Disp: , Rfl:    Magnesium 400 MG TABS, Take 400 mg by mouth 2 (two) times daily., Disp: , Rfl:    meloxicam (MOBIC) 7.5 MG tablet, Take 7.5 mg by mouth 2 (two) times daily as needed (pain/inflammation.). , Disp: , Rfl:    methotrexate (RHEUMATREX) 2.5 MG tablet, Take 25 mg by mouth every Sunday. 10 tablets once a week, Disp: , Rfl:    metoprolol succinate (TOPROL-XL) 25 MG 24 hr tablet, Take 37.5 mg by mouth every morning. , Disp: , Rfl:    mupirocin ointment (BACTROBAN) 2 %, Place 1 application into the nose 2 (two) times daily. Use in each nostril twice daily for five (5) days., Disp: 22 g, Rfl: 5   nortriptyline  (PAMELOR) 10 MG capsule, Take 30 mg by mouth at bedtime. , Disp: , Rfl:    oxyCODONE (OXY IR/ROXICODONE) 5 MG immediate release tablet, Take 1 tablet (5 mg total) by mouth every 6 (six) hours as needed for moderate pain or severe pain., Disp: 60 tablet, Rfl: 0   PAXIL CR 12.5 MG 24 hr tablet, Take 12.5 mg by mouth daily., Disp: ,  Rfl:    phentermine (ADIPEX-P) 37.5 MG tablet, Take 37.5 mg by mouth daily before breakfast. , Disp: , Rfl:    pregabalin (LYRICA) 150 MG capsule, Take 150 mg by mouth 2 (two) times daily., Disp: , Rfl:    promethazine (PHENERGAN) 25 MG tablet, Take 1 tablet (25 mg total) by mouth every 8 (eight) hours as needed for nausea or vomiting., Disp: 90 tablet, Rfl: 0   pyridOXINE (VITAMIN B-6) 100 MG tablet, Take 100 mg by mouth daily., Disp: , Rfl:    topiramate (TOPAMAX) 50 MG tablet, Take 50 mg by mouth 2 (two) times daily. , Disp: , Rfl:    vitamin C (ASCORBIC ACID) 500 MG tablet, Take 500 mg by mouth daily., Disp: , Rfl:    hydroxychloroquine (PLAQUENIL) 200 MG tablet, Take by mouth. (Patient not taking: Reported on 06/18/2021), Disp: , Rfl:  No current facility-administered medications for this visit.  Facility-Administered Medications Ordered in Other Visits:    fulvestrant (FASLODEX) injection 500 mg, 500 mg, Intramuscular, Q30 days, Earlie Server, MD, 500 mg at 06/18/21 1156   heparin lock flush 100 unit/mL, 500 Units, Intravenous, Once, Earlie Server, MD   sodium chloride flush (NS) 0.9 % injection 10 mL, 10 mL, Intravenous, PRN, Earlie Server, MD, 10 mL at 11/19/18 1249   sodium chloride flush (NS) 0.9 % injection 10 mL, 10 mL, Intravenous, Once, Earlie Server, MD   Physical exam:  Vitals:   06/18/21 0949  BP: 105/73  Pulse: 79  Resp: 18  Temp: 97.9 F (36.6 C)  Weight: 189 lb (85.7 kg)  ECOG 1 Physical Exam Constitutional:      General: She is not in acute distress.    Appearance: She is not diaphoretic.  HENT:     Head: Normocephalic and atraumatic.     Nose: Nose normal.      Mouth/Throat:     Pharynx: No oropharyngeal exudate.  Eyes:     General: No scleral icterus.    Pupils: Pupils are equal, round, and reactive to light.     Comments: Right  subconjunctival hemorrhage  Neck:     Vascular: No JVD.  Cardiovascular:     Rate and Rhythm: Normal rate and regular rhythm.     Heart sounds: Normal heart sounds. No murmur heard. Pulmonary:     Effort: Pulmonary effort is normal. No respiratory distress.     Breath sounds: Normal breath sounds. No wheezing or rales.  Chest:     Chest wall: No tenderness.  Abdominal:     General: Bowel sounds are normal. There is no distension.     Palpations: Abdomen is soft. There is no mass.     Tenderness: There is no abdominal tenderness. There is no rebound.  Musculoskeletal:        General: Normal range of motion.     Cervical back: Normal range of motion and neck supple.  Lymphadenopathy:     Cervical: No cervical adenopathy.  Skin:    General: Skin is warm and dry.     Findings: No erythema or rash.  Neurological:     Mental Status: She is alert and oriented to person, place, and time.     Cranial Nerves: No cranial nerve deficit.     Motor: No abnormal muscle tone.     Coordination: Coordination normal.  Psychiatric:        Mood and Affect: Affect normal.          CMP Latest Ref Rng &  Units 06/18/2021  Glucose 70 - 99 mg/dL 122(H)  BUN 6 - 20 mg/dL 19  Creatinine 0.44 - 1.00 mg/dL 1.05(H)  Sodium 135 - 145 mmol/L 138  Potassium 3.5 - 5.1 mmol/L 3.3(L)  Chloride 98 - 111 mmol/L 107  CO2 22 - 32 mmol/L 23  Calcium 8.9 - 10.3 mg/dL 9.0  Total Protein 6.5 - 8.1 g/dL 7.1  Total Bilirubin 0.3 - 1.2 mg/dL 0.7  Alkaline Phos 38 - 126 U/L 80  AST 15 - 41 U/L 29  ALT 0 - 44 U/L 20   CBC Latest Ref Rng & Units 06/18/2021  WBC 4.0 - 10.5 K/uL 7.4  Hemoglobin 12.0 - 15.0 g/dL 10.6(L)  Hematocrit 36.0 - 46.0 % 32.5(L)  Platelets 150 - 400 K/uL 281   RADIOGRAPHIC STUDIES: I have personally reviewed  the radiological images as listed and agreed with the findings in the report. ECHOCARDIOGRAM LIMITED  Result Date: 05/05/2021    ECHOCARDIOGRAM LIMITED REPORT   Patient Name:   Theresa Chaney Date of Exam: 05/05/2021 Medical Rec #:  [id number]      Height:       67.0 in Accession #:    [id number]     Weight:       192.1 lb Date of Birth:  [birth year]      BSA:          1.988 m Patient Age:    43 years       BP:           116/72 mmHg Patient Gender: F              HR:           102 bpm. Exam Location:  [facility] Procedure: Limited Echo, Cardiac Doppler, Color Doppler and Strain Analysis Indications:     R00.0 Tachycardia-unspecified.  History:         Patient has prior history of Echocardiogram examinations, most                  recent 01/29/2021. Cardiomyopathy, COPD; Signs/Symptoms:Murmur.                  Personal history of chemotherapy.  Sonographer:     Sherrie Sport RDCS ([facility]) Referring Phys:  [id number] Siedah Sedor Diagnosing Phys: Kate Sable MD  Sonographer Comments: Global longitudinal strain was attempted. IMPRESSIONS  1. Left ventricular ejection fraction, by estimation, is 55 to 60%. The left ventricle has normal function. The left ventricle has no regional wall motion abnormalities.  2. Right ventricular systolic function is normal. The right ventricular size is normal.  3. The mitral valve is normal in structure. No evidence of mitral valve regurgitation.  4. The aortic valve is tricuspid. Aortic valve regurgitation is not visualized. FINDINGS  Left Ventricle: Left ventricular ejection fraction, by estimation, is 55 to 60%. The left ventricle has normal function. The left ventricle has no regional wall motion abnormalities. Global longitudinal strain performed but not reported based on interpreter judgement due to suboptimal tracking. 3D left ventricular ejection fraction analysis performed but not reported based on interpreter judgement due to suboptimal quality. The left ventricular internal cavity size was  normal in size. There is no left ventricular hypertrophy. Right Ventricle: The right ventricular size is normal. No increase in right ventricular wall thickness. Right ventricular systolic function is normal. Left Atrium: Left atrial size was normal in size. Right Atrium: Right atrial size was normal in size. Mitral Valve: The mitral valve is  normal in structure. Tricuspid Valve: The tricuspid valve is normal in structure. Tricuspid valve regurgitation is trivial. Aortic Valve: The aortic valve is tricuspid. Aortic valve regurgitation is not visualized. Aorta: The aortic root is normal in size and structure. IAS/Shunts: No atrial level shunt detected by color flow Doppler. LEFT VENTRICLE PLAX 2D LVIDd:         4.23 cm LVIDs:         2.98 cm LV PW:         1.09 cm LV IVS:        0.87 cm LVOT diam:     2.00 cm  3D Volume EF: LV SV:         45       3D EF:        53 % LV SV Index:   23       LV EDV:       122 ml LVOT Area:     3.14 cm LV ESV:       57 ml                         LV SV:        65 ml LEFT ATRIUM             Index       RIGHT ATRIUM           Index LA diam:        3.20 cm 1.61 cm/m  RA Area:     12.40 cm LA Vol (A2C):   35.7 ml 17.96 ml/m RA Volume:   26.50 ml  13.33 ml/m LA Vol (A4C):   30.2 ml 15.19 ml/m LA Biplane Vol: 33.2 ml 16.70 ml/m  AORTIC VALVE             PULMONIC VALVE LVOT Vmax:   73.80 cm/s  PV Vmax:        0.69 m/s LVOT Vmean:  53.300 cm/s PV Peak grad:   1.9 mmHg LVOT VTI:    0.144 m     RVOT Peak grad: 2 mmHg  AORTA Ao Root diam: 2.80 cm  SHUNTS Systemic VTI:  0.14 m Systemic Diam: 2.00 cm Kate Sable MD Electronically signed by Kate Sable MD Signature Date/Time: 05/05/2021/1:18:12 PM    Final          Assessment and plan-  Cancer Staging Breast cancer of upper-outer quadrant of right female breast [facility]) Staging form: Breast, AJCC 8th Edition - Clinical: G2, ER+, PR+, HER2- - Signed by Earlie Server, MD on 04/05/2018 Histologic grading system: 3 grade system -  Pathologic stage from 04/04/2018: No Stage Recommended (ypT3, pN2, cM0, G3, ER+, PR-, HER2-) - Signed by Earlie Server, MD on 04/04/2018 Stage prefix: Post-therapy Neoadjuvant therapy: Yes Response to neoadjuvant therapy: No response Method of lymph node assessment: Axillary lymph node dissection Multigene prognostic tests performed: None Histologic grading system: 3 grade system Laterality: Right - Pathologic: Stage IV (rpTX, pNX, cM1, ER+, PR-, HER2+) - Signed by Earlie Server, MD on 12/10/2020 Stage prefix: Recurrence    1. Encounter for monoclonal antibody treatment for malignancy   2. Metastatic breast cancer ([place])   3. HER2-positive carcinoma of breast ([place])   4. Encounter for antineoplastic chemotherapy   5. Bone metastasis ([place])    Recurrent breast cancer with isolated bone metastasis, stage IV #Initially stage IIIA right breast cancer, ER positive, HER-2 negative status post bilateral oophorectomy, Right mastectomy and  right axillary lymph node dissection, status post implant, and implant removal.  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 Estrogen positive, HER-2 positive breast cancer.- s/p Palliative radiation to T7  Labs are reviewed and discussed with patient. Proceed with transtuzumab and pertuzumab today.  Fulvestrant monthly.   # Elevated creatinine, IV NS 1L x 1 #Anemia, slightly worse  Likely secondary to methotrexate.  Monitor. #Tachycardia, chronic intermittent issue for her.  Heart rate is normal today 525/22 2D echocardiogram LVEF 55%-60%. She is on metoprolol.   # Hypokalemia, does not tolerate oral potassium.  Proceed with IV Kcl 42mq over 1 hour.  #Treatment induced diarrhea,Continue Lomotil as needed.  Symptoms are stable. #Bone metastasis, status post radiation. Continue Zometa monthly.  #Back pain, monitor symptoms.  continue oxycodone 5 mg daily 6 hours as needed. Recommend  topical votalren #Chemotherapy-induced neuropathy, on Lyrica and nortriptyline, continue.  #Depression.  Continue Lexapro 20 mg daily. #Rheumatoid arthritis,   Follow up with  rheumatology.  Patient is on methotrexate. # Hot flash due to postmenopausal state, already on Lexapro. Can not combine with Effexor.    Follow-up in 3 weeks for the next cycle of trastuzumab and Pertuzumab treatments.  Patient knows to call clinic if she experiences concerning side effects  ZEarlie Server MD, PhD Hematology Oncology [facility]07/08/22

## 2021-06-18 NOTE — Patient Instructions (Signed)
[facility]   Discharge Instructions: Thank you for choosing [facility] to provide your oncology and hematology care.  If you have a lab appointment with the [facility], please go directly to the [facility] and check in at the registration area.  Wear comfortable clothing and clothing appropriate for easy access to any Portacath or PICC line.   We strive to give you quality time with your provider. You may need to reschedule your appointment if you arrive late (15 or more minutes).  Arriving late affects you and other patients whose appointments are after yours.  Also, if you miss three or more appointments without notifying the office, you may be dismissed from the clinic at the provider's discretion.      For prescription refill requests, have your pharmacy contact our office and allow 72 hours for refills to be completed.    Today you received the following chemotherapy and/or immunotherapy agents: Kanjinti, Perjeta. Today you also received the following: Potassium Chloride infusion, Zometa infusion, and Faslodex injection.      To help prevent nausea and vomiting after your treatment, we encourage you to take your nausea medication as directed.  BELOW ARE SYMPTOMS THAT SHOULD BE REPORTED IMMEDIATELY: *FEVER GREATER THAN 100.4 F (38 C) OR HIGHER *CHILLS OR SWEATING *NAUSEA AND VOMITING THAT IS NOT CONTROLLED WITH YOUR NAUSEA MEDICATION *UNUSUAL SHORTNESS OF BREATH *UNUSUAL BRUISING OR BLEEDING *URINARY PROBLEMS (pain or burning when urinating, or frequent urination) *BOWEL PROBLEMS (unusual diarrhea, constipation, pain near the anus) TENDERNESS IN MOUTH AND THROAT WITH OR WITHOUT PRESENCE OF ULCERS (sore throat, sores in mouth, or a toothache) UNUSUAL RASH, SWELLING OR PAIN  UNUSUAL VAGINAL DISCHARGE OR ITCHING   Items with * indicate a potential emergency and should be followed up as soon as possible or go to the Emergency  Department if any problems should occur.  Please show the CHEMOTHERAPY ALERT CARD or IMMUNOTHERAPY ALERT CARD at check-in to the Emergency Department and triage nurse.  Should you have questions after your visit or need to cancel or reschedule your appointment, please contact [facility]  [phone] and follow the prompts.  Office hours are 8:00 a.m. to 4:30 p.m. Monday - Friday. Please note that voicemails left after 4:00 p.m. may not be returned until the following business day.  We are closed weekends and major holidays. You have access to a nurse at all times for urgent questions. Please call the main number to the clinic [phone] and follow the prompts.  For any non-urgent questions, you may also contact your provider using MyChart. We now offer e-Visits for anyone 81 and older to request care online for non-urgent symptoms. For details visit [email or web address].   Also download the MyChart app! Go to the app store, search "MyChart", open the app, select , and log in with your MyChart username and password.  Due to Covid, a mask is required upon entering the hospital/clinic. If you do not have a mask, one will be given to you upon arrival. For doctor visits, patients may have 1 support person aged 85 or older with them. For treatment visits, patients cannot have anyone with them due to current Covid guidelines and our immunocompromised population.

## 2021-06-21 NOTE — Telephone Encounter (Signed)
Please let Theresa Chaney know that the main concern with Paxil and hydroxychloroquine would be changes to her EKG, specifically QT prolongation.  Her QTc was normal on EKG last week.  I recommend that an EKG be repeated about a week after her change to Paxil to ensure that her QT has not prolonged significantly.  EKG could be done through her PCP or our office.  Nelva Bush, MD [facility]

## 2021-06-22 NOTE — Telephone Encounter (Signed)
Called and spoke to pt. Made aware of Dr. Darnelle Bos recc.  Pt voiced understanding.  She has an appt to see her PCP, Dr. Delight Stare in 1 week 06/28/21.  Pt will have EKG done at PCP office and have sent to our office.  Fax number given.  Pt has no further questions at this time.

## 2021-07-02 ENCOUNTER — Ambulatory Visit: Payer: Medicare HMO | Admitting: Internal Medicine

## 2021-07-09 ENCOUNTER — Inpatient Hospital Stay: Payer: Medicare HMO

## 2021-07-09 ENCOUNTER — Inpatient Hospital Stay (HOSPITAL_BASED_OUTPATIENT_CLINIC_OR_DEPARTMENT_OTHER): Payer: Medicare HMO | Admitting: Oncology

## 2021-07-09 ENCOUNTER — Encounter: Payer: Self-pay | Admitting: Oncology

## 2021-07-09 VITALS — BP 110/74 | HR 97 | Temp 95.7°F | Resp 18 | Wt 183.6 lb

## 2021-07-09 DIAGNOSIS — E876 Hypokalemia: Secondary | ICD-10-CM

## 2021-07-09 DIAGNOSIS — C7951 Secondary malignant neoplasm of bone: Secondary | ICD-10-CM | POA: Diagnosis not present

## 2021-07-09 DIAGNOSIS — M546 Pain in thoracic spine: Secondary | ICD-10-CM

## 2021-07-09 DIAGNOSIS — C50919 Malignant neoplasm of unspecified site of unspecified female breast: Secondary | ICD-10-CM | POA: Diagnosis not present

## 2021-07-09 DIAGNOSIS — T451X5A Adverse effect of antineoplastic and immunosuppressive drugs, initial encounter: Secondary | ICD-10-CM

## 2021-07-09 DIAGNOSIS — G620 Drug-induced polyneuropathy: Secondary | ICD-10-CM | POA: Diagnosis not present

## 2021-07-09 DIAGNOSIS — C50811 Malignant neoplasm of overlapping sites of right female breast: Secondary | ICD-10-CM

## 2021-07-09 DIAGNOSIS — C50411 Malignant neoplasm of upper-outer quadrant of right female breast: Secondary | ICD-10-CM

## 2021-07-09 DIAGNOSIS — Z5112 Encounter for antineoplastic immunotherapy: Secondary | ICD-10-CM

## 2021-07-09 DIAGNOSIS — Z5111 Encounter for antineoplastic chemotherapy: Secondary | ICD-10-CM | POA: Diagnosis not present

## 2021-07-09 DIAGNOSIS — G8929 Other chronic pain: Secondary | ICD-10-CM

## 2021-07-09 DIAGNOSIS — Z170 Estrogen receptor positive status [ER+]: Secondary | ICD-10-CM

## 2021-07-09 LAB — CBC WITH DIFFERENTIAL/PLATELET
Abs Immature Granulocytes: 0.02 10*3/uL (ref 0.00–0.07)
Basophils Absolute: 0 10*3/uL (ref 0.0–0.1)
Basophils Relative: 0 %
Eosinophils Absolute: 0.2 10*3/uL (ref 0.0–0.5)
Eosinophils Relative: 2 %
HCT: 33.2 % — ABNORMAL LOW (ref 36.0–46.0)
Hemoglobin: 11.3 g/dL — ABNORMAL LOW (ref 12.0–15.0)
Immature Granulocytes: 0 %
Lymphocytes Relative: 15 %
Lymphs Abs: 1.3 10*3/uL (ref 0.7–4.0)
MCH: 30.1 pg (ref 26.0–34.0)
MCHC: 34 g/dL (ref 30.0–36.0)
MCV: 88.5 fL (ref 80.0–100.0)
Monocytes Absolute: 0.8 10*3/uL (ref 0.1–1.0)
Monocytes Relative: 9 %
Neutro Abs: 6.2 10*3/uL (ref 1.7–7.7)
Neutrophils Relative %: 74 %
Platelets: 286 10*3/uL (ref 150–400)
RBC: 3.75 MIL/uL — ABNORMAL LOW (ref 3.87–5.11)
RDW: 13.2 % (ref 11.5–15.5)
WBC: 8.4 10*3/uL (ref 4.0–10.5)
nRBC: 0 % (ref 0.0–0.2)

## 2021-07-09 LAB — COMPREHENSIVE METABOLIC PANEL
ALT: 20 U/L (ref 0–44)
AST: 30 U/L (ref 15–41)
Albumin: 4.2 g/dL (ref 3.5–5.0)
Alkaline Phosphatase: 76 U/L (ref 38–126)
Anion gap: 10 (ref 5–15)
BUN: 13 mg/dL (ref 6–20)
CO2: 19 mmol/L — ABNORMAL LOW (ref 22–32)
Calcium: 8.6 mg/dL — ABNORMAL LOW (ref 8.9–10.3)
Chloride: 109 mmol/L (ref 98–111)
Creatinine, Ser: 0.77 mg/dL (ref 0.44–1.00)
GFR, Estimated: >60 mL/min (ref >60)
Glucose, Bld: 126 mg/dL — ABNORMAL HIGH (ref 70–99)
Potassium: 3.1 mmol/L — ABNORMAL LOW (ref 3.5–5.1)
Sodium: 138 mmol/L (ref 135–145)
Total Bilirubin: 0.2 mg/dL — ABNORMAL LOW (ref 0.3–1.2)
Total Protein: 7.5 g/dL (ref 6.5–8.1)

## 2021-07-09 LAB — MAGNESIUM: Magnesium: 2.1 mg/dL (ref 1.7–2.4)

## 2021-07-09 MED ORDER — HEPARIN SOD (PORK) LOCK FLUSH 100 UNIT/ML IV SOLN
500.0000 [IU] | Freq: Once | INTRAVENOUS | Status: AC | PRN
  Administered 2021-07-09: 500 [IU]
  Filled 2021-07-09: qty 5

## 2021-07-09 MED ORDER — SODIUM CHLORIDE 0.9% FLUSH
10.0000 mL | Freq: Once | INTRAVENOUS | Status: AC
  Administered 2021-07-09: 10 mL via INTRAVENOUS
  Filled 2021-07-09: qty 10

## 2021-07-09 MED ORDER — POTASSIUM CHLORIDE 20 MEQ/100ML IV SOLN
20.0000 meq | Freq: Once | INTRAVENOUS | Status: AC
  Administered 2021-07-09: 20 meq via INTRAVENOUS

## 2021-07-09 MED ORDER — OXYCODONE HCL 5 MG PO TABS: 5.0000 mg | ORAL_TABLET | Freq: Four times a day (QID) | ORAL | 0 refills | Status: DC | PRN

## 2021-07-09 MED ORDER — TRASTUZUMAB-ANNS CHEMO 150 MG IV SOLR
6.0000 mg/kg | Freq: Once | INTRAVENOUS | Status: AC
  Administered 2021-07-09: 504 mg via INTRAVENOUS
  Filled 2021-07-09: qty 24

## 2021-07-09 MED ORDER — DIPHENHYDRAMINE HCL 25 MG PO CAPS
50.0000 mg | ORAL_CAPSULE | Freq: Once | ORAL | Status: AC
  Administered 2021-07-09: 50 mg via ORAL
  Filled 2021-07-09: qty 2

## 2021-07-09 MED ORDER — SODIUM CHLORIDE 0.9 % IV SOLN
Freq: Once | INTRAVENOUS | Status: AC
  Filled 2021-07-09: qty 250

## 2021-07-09 MED ORDER — HEPARIN SOD (PORK) LOCK FLUSH 100 UNIT/ML IV SOLN
INTRAVENOUS | Status: AC
  Filled 2021-07-09: qty 5

## 2021-07-09 MED ORDER — ACETAMINOPHEN 325 MG PO TABS
650.0000 mg | ORAL_TABLET | Freq: Once | ORAL | Status: AC
  Administered 2021-07-09: 650 mg via ORAL

## 2021-07-09 MED ORDER — SODIUM CHLORIDE 0.9 % IV SOLN
420.0000 mg | Freq: Once | INTRAVENOUS | Status: AC
  Administered 2021-07-09: 420 mg via INTRAVENOUS
  Filled 2021-07-09: qty 14

## 2021-07-09 NOTE — Progress Notes (Signed)
Hematology/Oncology Follow Up Note [place] [phone] [phone]  Patient Care Team: Marguerita Merles, MD as PCP - General (Family Medicine) End, Harrell Gave, MD as PCP - Cardiology (Cardiology) Rico Junker, RN as Oncology Nurse Navigator Earlie Server, MD as Consulting Physician (Oncology) Bary Castilla, Forest Gleason, MD (General Surgery) Noreene Filbert, MD as Referring Physician (Radiation Oncology) Noreene Filbert, MD as Radiation Oncologist (Radiation Oncology) Noreene Filbert, MD as Radiation Oncologist (Radiation Oncology)   Name of the patient: Theresa Chaney  [id number]  04-25-1981   Date of visit: 07/09/21 REASON FOR VISIT Follow up for  treatment of breast cancer  Oncology History Cancer TREATMENT Neoadjuvant ddAC +one dose of Taxol, due to lack of response, surgery was offered. Case was discussed on breast tumor board. 03/19/2018 S/p right mastectomy and right axillary dissection, immediate breast reconstruction with placement of expanders.  ypT3 ypN2, + lymphovascular invasion,  Grade 3, margin is negative, close. ER 90%, PR 0%, HER2 IHC negative.  Also had elective bilateral salpingo-oophorectomy..  # 06/11/2018 s/p 11 cycles Taxol adjuvantly. Tolerated well.  # She has obtained dental clearance for starting Zometa.  S/p Zometa on 6/3/ 2019 # s/p adjuvant right chest wall radiation, finished 10/10/2018 #06/03/2019 underwent elective left prophylactic mastectomy and sentinel lymph node biopsy of left axilla.  # 06/03/2019 underwent elective left prophylactic mastectomy and sentinel lymph node biopsy of left axilla. Pathology negative for malignancy. Status post Mediport removal on 06/03/2019. She also underwent right implant removal on 06/03/2019. She also underwent right implant removal on 06/03/2019. #Negative genetic testing  #  chemotherapy-induced neuropathy, bilateral fingertips and lower extremities.  Patient is on Lyrica and  nortriptyline.  Follows up with neurology. She follows up with lymphedema clinic.  #  07/07/2020, MRI thoracic spine without contrast showed lesions involving the T7 posterior elements most concerning for metastatic lesion.  No evidence of epidural tumor.  Minimal thoracic spondylosis without stenosis. MRI was reviewed by me and a PET scan was obtained for further evaluation. 07/20/2020, PET scan showed hypermetabolic metastasis involving the posterior element of T7, no additional evidence of metastasis in the neck, chest, abdomen or pelvis.  # 07/29/2020 T7 lesion biopsy showed metastatic carcinoma, compatible with breast origin.  Receptor status staining showed ER 71-80% positive, PR negative, HER-2 positive IHC 3+ # Patient finished spine radiation on 08/31/2020   positive for COVID-19 on 01/11/2021  # 03/15/2021, PET scan showed no focal hypermetabolic activity to suggest skeletal metastasis.  Mild hypermetabolic activity along the right T7-8 paraspinal musculature.  Max SUV 3.3.  Likely postprocedural-  #Lower extremity cramps when she eats banana or oral potassium supplementation.not able to tolerate po potassium.   INTERVAL HISTORY 40 yo female with above oncology history reviewed by me presents for follow-up of management of metastatic breast cancer.  Chronic intermittent back pain, symptoms is worse. She ran out of oxycondone, requests refill.  Hand joint pain and tightness   Review of Systems  Constitutional:  Negative for chills, fever, malaise/fatigue and weight loss.  HENT:  Negative for sore throat.   Eyes:  Negative for redness.  Respiratory:  Negative for cough, shortness of breath and wheezing.   Cardiovascular:  Negative for chest pain, palpitations and leg swelling.  Gastrointestinal:  Negative for abdominal pain, blood in stool, heartburn, nausea and vomiting.  Genitourinary:  Negative for dysuria.  Musculoskeletal:  Positive for back pain and joint pain. Negative for  myalgias.  Skin:  Negative for rash.  Neurological:  Positive  for tingling. Negative for dizziness and tremors.  Endo/Heme/Allergies:  Does not bruise/bleed easily.  Psychiatric/Behavioral:  Negative for hallucinations.    No Known Allergies  Patient Active Problem List   Diagnosis Date Noted   Tachycardia, unspecified 04/15/2021   Bone metastasis ([place]) 03/25/2021   Encounter for monoclonal antibody treatment for malignancy 12/10/2020   Bone lesion 11/19/2020   Hypocalcemia 11/19/2020   Inflammatory arthritis 11/19/2020   Encounter for antineoplastic chemotherapy 10/29/2020   Metastatic breast cancer ([place]) 08/31/2020   Goals of care, counseling/discussion 08/11/2020   Neuropathy due to chemotherapeutic drug ([place]) 08/11/2020   HER2-positive carcinoma of breast ([place]) 08/11/2020   Rheumatoid arteritis ([place]) 10/13/2019   Bilateral hand swelling 10/03/2019   Fracture of neck of metacarpal bone 05/14/2019   Chronic fatigue 04/09/2019   Polyarthralgia 04/09/2019   Status post right breast reconstruction 02/26/2019   Status post right mastectomy 02/26/2019   Mastalgia 02/15/2019   Shortness of breath 08/23/2018   Nonischemic cardiomyopathy ([place]) 08/23/2018   Preprocedural cardiovascular examination 08/23/2018   Tachycardia 08/23/2018   Palpitations 08/23/2018   Estrogen receptor positive status (ER+) 04/04/2018   Acquired absence of right breast and nipple 04/03/2018   Breast cancer of upper-outer quadrant of right female breast ([place]) 03/19/2018   Family history of cancer    Malignant neoplasm of overlapping sites of right breast in female, estrogen receptor positive ([place]) 10/19/2017   Gastroesophageal reflux disease without esophagitis 02/24/2017   Generalized anxiety disorder 10/03/2014   Headache 10/03/2014     Past Medical History:  Diagnosis Date   Anemia    Arthritis    BRCA negative 11/26/2017   Breast cancer ([place]) 10/11/2017   Multifocal, ER positive, PR negative,  HER-2 negative. ypT3 ypN2a 8.7 cm, 4/15 nodes   Cardiomyopathy ([place])    a. 10/2017 Echo: EF 60-65%, no rwma, Gr1 DD, nl RV size/fxn; b. 04/2018 Echo: EF 55-60%, no rwma, Nl RV size/fxn; c. 08/2018 Echo: EF 45%, diff HK, ? HK of antsept wall. Gr1 DD. Mild MR. Mild LAE/RAE. Mod dil RV.    Chronic bronchitis ([place]) 11/2017   COPD (chronic obstructive pulmonary disease) (HCC)    MILD PER CXR   Depression    Family history of cancer    GERD (gastroesophageal reflux disease)    Headache    MIGRAINES   Heart murmur    ASYMPTOMATIC   Personal history of chemotherapy    current for right breast ca     Past Surgical History:  Procedure Laterality Date   AXILLARY LYMPH NODE DISSECTION Right 03/19/2018   Procedure: AXILLARY LYMPH NODE DISSECTION;  Surgeon: Robert Bellow, MD;  Location: [facility];  Service: General;  Laterality: Right;   BREAST BIOPSY Right 10/11/2017   12:30 posterior coil clip invasive mammary carcinoma   BREAST BIOPSY Right 10/11/2017   11:30 middle depth ribbon clip DCIS   BREAST BIOPSY Right 10/11/2017   5:30 anterior depth x shape invasive ductal carcinoma   BREAST IMPLANT REMOVAL Right 06/03/2019   Procedure: REMOVAL OF RIGHT BREAST IMPLANTS;  Surgeon: Wallace Going, DO;  Location: [facility];  Service: Plastics;  Laterality: Right;   BREAST RECONSTRUCTION WITH PLACEMENT OF TISSUE EXPANDER AND FLEX HD (ACELLULAR HYDRATED DERMIS) Right 03/19/2018   Procedure: BREAST RECONSTRUCTION WITH PLACEMENT OF TISSUE EXPANDER AND FLEX HD (ACELLULAR HYDRATED DERMIS);  Surgeon: Wallace Going, DO;  Location: [facility];  Service: Plastics;  Laterality: Right;   CARPAL TUNNEL RELEASE Bilateral 2020   CHOLECYSTECTOMY N/A 04/27/2020  Procedure: LAPAROSCOPIC CHOLECYSTECTOMY WITH INTRAOPERATIVE CHOLANGIOGRAM;  Surgeon: Robert Bellow, MD;  Location: [facility];  Service: General;  Laterality: N/A;   ESOPHAGOGASTRODUODENOSCOPY (EGD) WITH PROPOFOL N/A 04/17/2020   Procedure:  ESOPHAGOGASTRODUODENOSCOPY (EGD) WITH PROPOFOL;  Surgeon: Robert Bellow, MD;  Location: [facility];  Service: Endoscopy;  Laterality: N/A;  with biopsy   LAPAROSCOPIC BILATERAL SALPINGO OOPHERECTOMY Bilateral 03/19/2018   Procedure: LAPAROSCOPIC BILATERAL SALPINGO OOPHORECTOMY;  Surgeon: Benjaman Kindler, MD;  Location: [facility];  Service: Gynecology;  Laterality: Bilateral;   MASTECTOMY Right 03/2018   MASTECTOMY W/ SENTINEL NODE BIOPSY Right 03/19/2018   Procedure: MASTECTOMY WITH SENTINEL LYMPH NODE BIOPSY;  Surgeon: Robert Bellow, MD;  Location: [facility] [facility];  Service: General;  Laterality: Right;   PORT-A-CATH REMOVAL Left 06/03/2019   Procedure: REMOVAL PORT-A-CATH;  Surgeon: Robert Bellow, MD;  Location: [place] [facility];  Service: General;  Laterality: Left;   PORTACATH PLACEMENT Left 10/24/2017   Procedure: INSERTION PORT-A-CATH;  Surgeon: Robert Bellow, MD;  Location: [facility];  Service: General;  Laterality: Left;   PORTACATH PLACEMENT Right 09/28/2020   Procedure: INSERTION PORT-A-CATH;  Surgeon: Robert Bellow, MD;  Location: [facility];  Service: General;  Laterality: Right;   REMOVAL OF TISSUE EXPANDER AND PLACEMENT OF IMPLANT Right 07/20/2018   Procedure: REMOVAL OF RIGHT BREAST TISSUE EXPANDER AND PLACEMENT OF IMPLANT;  Surgeon: Wallace Going, DO;  Location: [place];  Service: Plastics;  Laterality: Right;   SIMPLE MASTECTOMY WITH AXILLARY SENTINEL NODE BIOPSY Left 06/03/2019   Procedure: SIMPLE MASTECTOMY LEFT;  Surgeon: Robert Bellow, MD;  Location: [facility];  Service: General;  Laterality: Left;    Social History   Socioeconomic History   Marital status: Married    Spouse name: Not on file   Number of children: Not on file   Years of education: Not on file   Highest education level: Not on file  Occupational History   Occupation: pharmacy tech    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 [employer]   Tobacco Use   Smoking status: Every  Day    Packs/day: 0.50    Years: 18.00    Pack years: 9.00    Types: Cigarettes    Start date: 06/21/2018   Smokeless tobacco: Never  Vaping Use   Vaping Use: Never used  Substance and Sexual Activity   Alcohol use: No   Drug use: No   Sexual activity: Yes    Birth control/protection: Injection, Other-see comments    Comment: has had hysterectomy  Other Topics Concern   Not on file  Social History Narrative   Lives at home with husband and daughter   Social Determinants of Health   Financial Resource Strain: Not on file  Food Insecurity: Not on file  Transportation Needs: Not on file  Physical Activity: Not on file  Stress: Not on file  Social Connections: Not on file  Intimate Partner Violence: Not on file     Family History  Problem Relation Age of Onset   Melanoma Maternal Aunt        other aunts with BCC/SCC/Melanoma   Diabetes Father    Hypertension Father    Hyperlipidemia Father    Heart attack Father 73       "mild"   Bladder Cancer Maternal Grandmother    Cervical cancer Maternal Aunt 64       daughter w/ cervical cancer as well   Melanoma Maternal Uncle        other  uncles with BCC/SCC/Melanoma     Current Outpatient Medications:    acetaminophen (TYLENOL) 500 MG tablet, Take 500 mg by mouth every 6 (six) hours as needed., Disp: , Rfl:    albuterol (VENTOLIN HFA) 108 (90 Base) MCG/ACT inhaler, Inhale 2 puffs into the lungs every 6 (six) hours as needed for wheezing or shortness of breath. , Disp: , Rfl:    Calcium Carbonate-Vitamin D 600-400 MG-UNIT tablet, Take 3 tablets by mouth daily., Disp: , Rfl:    cholecalciferol (VITAMIN D3) 25 MCG (1000 UNIT) tablet, Take 1 tablet (1,000 Units total) by mouth daily., Disp: 90 tablet, Rfl: 0   cyanocobalamin (,VITAMIN B-12,) 1000 MCG/ML injection, Inject 1,000 mcg into the skin every 30 (thirty) days., Disp: , Rfl:    cyclobenzaprine (FLEXERIL) 5 MG tablet, Take 5 mg by mouth 3 (three) times daily as needed for  muscle spasms. , Disp: , Rfl:    diphenoxylate-atropine (LOMOTIL) 2.5-0.025 MG tablet, Take 1 tablet by mouth 4 (four) times daily as needed for diarrhea or loose stools., Disp: 60 tablet, Rfl: 0   escitalopram (LEXAPRO) 20 MG tablet, Take 20 mg by mouth daily., Disp: , Rfl:    esomeprazole (NEXIUM) 40 MG capsule, Take 40 mg by mouth daily before breakfast. , Disp: , Rfl:    Eszopiclone 3 MG TABS, Take 3 mg by mouth at bedtime as needed (sleep). , Disp: , Rfl:    folic acid (FOLVITE) 1 MG tablet, Take 1 mg by mouth daily., Disp: , Rfl:    hydroxychloroquine (PLAQUENIL) 200 MG tablet, Take 200 mg by mouth daily., Disp: , Rfl:    ibuprofen (ADVIL) 800 MG tablet, Take 800 mg by mouth every 8 (eight) hours as needed for moderate pain., Disp: , Rfl:    lidocaine-prilocaine (EMLA) cream, Apply to port and cover 1-2 hours prior to appointment, Disp: 30 g, Rfl: 3   loperamide (IMODIUM) 2 MG capsule, Take 2 tablets with onset of diarrhea, then take 1 tablet every 2 hours until diarrhea stops. Maximum 8 tablets in 24hours, Disp: 30 capsule, Rfl: 3   loratadine (CLARITIN) 10 MG tablet, Take 10 mg by mouth daily. , Disp: , Rfl:    LORazepam (ATIVAN) 1 MG tablet, Take 1 mg by mouth 3 (three) times daily., Disp: , Rfl:    Magnesium 400 MG TABS, Take 400 mg by mouth 2 (two) times daily., Disp: , Rfl:    meloxicam (MOBIC) 7.5 MG tablet, Take 7.5 mg by mouth 2 (two) times daily as needed (pain/inflammation.). , Disp: , Rfl:    methotrexate (RHEUMATREX) 2.5 MG tablet, Take 25 mg by mouth every Sunday. 10 tablets once a week, Disp: , Rfl:    metoprolol succinate (TOPROL-XL) 25 MG 24 hr tablet, Take 37.5 mg by mouth every morning. , Disp: , Rfl:    mupirocin ointment (BACTROBAN) 2 %, Place 1 application into the nose 2 (two) times daily. Use in each nostril twice daily for five (5) days., Disp: 22 g, Rfl: 5   nortriptyline (PAMELOR) 10 MG capsule, Take 30 mg by mouth at bedtime. , Disp: , Rfl:    phentermine  (ADIPEX-P) 37.5 MG tablet, Take 37.5 mg by mouth daily before breakfast. , Disp: , Rfl:    pregabalin (LYRICA) 150 MG capsule, Take 150 mg by mouth 2 (two) times daily., Disp: , Rfl:    promethazine (PHENERGAN) 25 MG tablet, Take 1 tablet (25 mg total) by mouth every 8 (eight) hours as needed for nausea or vomiting.,  Disp: 90 tablet, Rfl: 0   pyridOXINE (VITAMIN B-6) 100 MG tablet, Take 100 mg by mouth daily., Disp: , Rfl:    topiramate (TOPAMAX) 50 MG tablet, Take 50 mg by mouth 2 (two) times daily. , Disp: , Rfl:    vitamin C (ASCORBIC ACID) 500 MG tablet, Take 500 mg by mouth daily., Disp: , Rfl:    oxyCODONE (OXY IR/ROXICODONE) 5 MG immediate release tablet, Take 1 tablet (5 mg total) by mouth every 6 (six) hours as needed for moderate pain or severe pain., Disp: 90 tablet, Rfl: 0 No current facility-administered medications for this visit.  Facility-Administered Medications Ordered in Other Visits:    heparin lock flush 100 unit/mL, 500 Units, Intravenous, Once, Earlie Server, MD   sodium chloride flush (NS) 0.9 % injection 10 mL, 10 mL, Intravenous, PRN, Earlie Server, MD, 10 mL at 11/19/18 1249   sodium chloride flush (NS) 0.9 % injection 10 mL, 10 mL, Intravenous, Once, Earlie Server, MD   Physical exam:  Vitals:   07/09/21 0859  BP: 110/74  Pulse: 97  Resp: 18  Temp: (!) 95.7 F (35.4 C)  TempSrc: Tympanic  Weight: 183 lb 9.6 oz (83.3 kg)  ECOG 1 Physical Exam Constitutional:      General: She is not in acute distress.    Appearance: She is not diaphoretic.  HENT:     Head: Normocephalic and atraumatic.     Nose: Nose normal.     Mouth/Throat:     Pharynx: No oropharyngeal exudate.  Eyes:     General: No scleral icterus.    Pupils: Pupils are equal, round, and reactive to light.     Comments: Right  subconjunctival hemorrhage resolved.  Neck:     Vascular: No JVD.  Cardiovascular:     Rate and Rhythm: Normal rate and regular rhythm.     Heart sounds: Normal heart sounds. No  murmur heard. Pulmonary:     Effort: Pulmonary effort is normal. No respiratory distress.     Breath sounds: Normal breath sounds. No wheezing or rales.  Chest:     Chest wall: No tenderness.  Abdominal:     General: Bowel sounds are normal. There is no distension.     Palpations: Abdomen is soft. There is no mass.     Tenderness: There is no abdominal tenderness. There is no rebound.  Musculoskeletal:        General: Normal range of motion.     Cervical back: Normal range of motion and neck supple.  Lymphadenopathy:     Cervical: No cervical adenopathy.  Skin:    General: Skin is warm and dry.     Findings: No erythema or rash.  Neurological:     Mental Status: She is alert and oriented to person, place, and time.     Cranial Nerves: No cranial nerve deficit.     Motor: No abnormal muscle tone.     Coordination: Coordination normal.  Psychiatric:        Mood and Affect: Affect normal.          CMP Latest Ref Rng & Units 07/09/2021  Glucose 70 - 99 mg/dL 126(H)  BUN 6 - 20 mg/dL 13  Creatinine 0.44 - 1.00 mg/dL 0.77  Sodium 135 - 145 mmol/L 138  Potassium 3.5 - 5.1 mmol/L 3.1(L)  Chloride 98 - 111 mmol/L 109  CO2 22 - 32 mmol/L 19(L)  Calcium 8.9 - 10.3 mg/dL 8.6(L)  Total Protein 6.5 - 8.1 g/dL  7.5  Total Bilirubin 0.3 - 1.2 mg/dL 0.2(L)  Alkaline Phos 38 - 126 U/L 76  AST 15 - 41 U/L 30  ALT 0 - 44 U/L 20   CBC Latest Ref Rng & Units 07/09/2021  WBC 4.0 - 10.5 K/uL 8.4  Hemoglobin 12.0 - 15.0 g/dL 11.3(L)  Hematocrit 36.0 - 46.0 % 33.2(L)  Platelets 150 - 400 K/uL 286   RADIOGRAPHIC STUDIES: I have personally reviewed the radiological images as listed and agreed with the findings in the report. ECHOCARDIOGRAM LIMITED  Result Date: 05/05/2021    ECHOCARDIOGRAM LIMITED REPORT   Patient Name:   SARYA LINENBERGER Date of Exam: 05/05/2021 Medical Rec #:  [id number]      Height:       67.0 in Accession #:    [id number]     Weight:       192.1 lb Date of Birth:   20-Aug-1981      BSA:          1.988 m Patient Age:    59 years       BP:           116/72 mmHg Patient Gender: F              HR:           102 bpm. Exam Location:  [facility] Procedure: Limited Echo, Cardiac Doppler, Color Doppler and Strain Analysis Indications:     R00.0 Tachycardia-unspecified.  History:         Patient has prior history of Echocardiogram examinations, most                  recent 01/29/2021. Cardiomyopathy, COPD; Signs/Symptoms:Murmur.                  Personal history of chemotherapy.  Sonographer:     Sherrie Sport RDCS (AE) Referring Phys:  [id number] Mazen Marcin Diagnosing Phys: Kate Sable MD  Sonographer Comments: Global longitudinal strain was attempted. IMPRESSIONS  1. Left ventricular ejection fraction, by estimation, is 55 to 60%. The left ventricle has normal function. The left ventricle has no regional wall motion abnormalities.  2. Right ventricular systolic function is normal. The right ventricular size is normal.  3. The mitral valve is normal in structure. No evidence of mitral valve regurgitation.  4. The aortic valve is tricuspid. Aortic valve regurgitation is not visualized. FINDINGS  Left Ventricle: Left ventricular ejection fraction, by estimation, is 55 to 60%. The left ventricle has normal function. The left ventricle has no regional wall motion abnormalities. Global longitudinal strain performed but not reported based on interpreter judgement due to suboptimal tracking. 3D left ventricular ejection fraction analysis performed but not reported based on interpreter judgement due to suboptimal quality. The left ventricular internal cavity size was normal in size. There is no left ventricular hypertrophy. Right Ventricle: The right ventricular size is normal. No increase in right ventricular wall thickness. Right ventricular systolic function is normal. Left Atrium: Left atrial size was normal in size. Right Atrium: Right atrial size was normal in size. Mitral Valve: The mitral valve  is normal in structure. Tricuspid Valve: The tricuspid valve is normal in structure. Tricuspid valve regurgitation is trivial. Aortic Valve: The aortic valve is tricuspid. Aortic valve regurgitation is not visualized. Aorta: The aortic root is normal in size and structure. IAS/Shunts: No atrial level shunt detected by color flow Doppler. LEFT VENTRICLE PLAX 2D LVIDd:         4.23 cm  LVIDs:         2.98 cm LV PW:         1.09 cm LV IVS:        0.87 cm LVOT diam:     2.00 cm  3D Volume EF: LV SV:         45       3D EF:        53 % LV SV Index:   23       LV EDV:       122 ml LVOT Area:     3.14 cm LV ESV:       57 ml                         LV SV:        65 ml LEFT ATRIUM             Index       RIGHT ATRIUM           Index LA diam:        3.20 cm 1.61 cm/m  RA Area:     12.40 cm LA Vol (A2C):   35.7 ml 17.96 ml/m RA Volume:   26.50 ml  13.33 ml/m LA Vol (A4C):   30.2 ml 15.19 ml/m LA Biplane Vol: 33.2 ml 16.70 ml/m  AORTIC VALVE             PULMONIC VALVE LVOT Vmax:   73.80 cm/s  PV Vmax:        0.69 m/s LVOT Vmean:  53.300 cm/s PV Peak grad:   1.9 mmHg LVOT VTI:    0.144 m     RVOT Peak grad: 2 mmHg  AORTA Ao Root diam: 2.80 cm  SHUNTS Systemic VTI:  0.14 m Systemic Diam: 2.00 cm Kate Sable MD Electronically signed by Kate Sable MD Signature Date/Time: 05/05/2021/1:18:12 PM    Final          Assessment and plan-  Cancer Staging Breast cancer of upper-outer quadrant of right female breast [facility]) Staging form: Breast, AJCC 8th Edition - Clinical: G2, ER+, PR+, HER2- - Signed by Earlie Server, MD on 04/05/2018 Histologic grading system: 3 grade system - Pathologic stage from 04/04/2018: No Stage Recommended (ypT3, pN2, cM0, G3, ER+, PR-, HER2-) - Signed by Earlie Server, MD on 04/04/2018 Stage prefix: Post-therapy Neoadjuvant therapy: Yes Response to neoadjuvant therapy: No response Method of lymph node assessment: Axillary lymph node dissection Multigene prognostic tests performed:  None Histologic grading system: 3 grade system Laterality: Right - Pathologic: Stage IV (rpTX, pNX, cM1, ER+, PR-, HER2+) - Signed by Earlie Server, MD on 12/10/2020 Stage prefix: Recurrence    1. Encounter for monoclonal antibody treatment for malignancy   2. Metastatic breast cancer ([place])   3. HER2-positive carcinoma of breast ([place])   4. Bone metastasis ([place])   5. Neuropathy due to chemotherapeutic drug ([place])   6. Hypocalcemia   7. Chronic midline thoracic back pain    Recurrent breast cancer with isolated bone metastasis, stage IV #Initially stage IIIA right breast cancer, ER positive, HER-2 negative status post bilateral oophorectomy, Right mastectomy and right axillary lymph node dissection, status post implant, and implant removal.  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 Estrogen positive, HER-2 positive breast cancer.- s/p Palliative radiation to T7  Labs are reviewed and discussed with patient. Proceed with transtuzumab and  pertuzumab today.  Fulvestrant monthly.   # Elevated creatinine,normalized #Anemia,stable.   Likely secondary to methotrexate.  Monitor. #Tachycardia, chronic intermittent issue for her.  Heart rate is normal today 525/22 2D echocardiogram LVEF 55%-60%. She is on metoprolol.   # Hypokalemia, does not tolerate oral potassium.  Proceed with IV Kcl 18mq over 1 hour.  #Treatment induced diarrhea,Continue Lomotil as needed.  Symptoms are stable. #Bone metastasis, status post radiation. Continue Zometa monthly.  #Back pain, symptoms are worse. Mainly at the lower thoracic spine. continue oxycodone 5 mg daily 6 hours as needed. Refilled prescription. Obtain MRI thoracic spine #Chemotherapy-induced neuropathy, on Lyrica and nortriptyline, continue.  #Depression.  Continue Lexapro 20 mg daily. #Rheumatoid arthritis,   Follow up with  rheumatology.  Patient is on methotrexate,  recently added plaquenil.  # Hot flash due to postmenopausal state, already on Lexapro. Can not combine with Effexor.    Follow-up in 3 weeks for the next cycle of trastuzumab and Pertuzumab treatments.  Patient knows to call clinic if she experiences concerning side effects  ZEarlie Server MD, PhD Hematology Oncology [facility]07/29/22

## 2021-07-09 NOTE — Patient Instructions (Signed)
[facility]  Discharge Instructions: Thank you for choosing [facility] to provide your oncology and hematology care.  If you have a lab appointment with the [facility], please go directly to the [facility] and check in at the registration area.  Wear comfortable clothing and clothing appropriate for easy access to any Portacath or PICC line.   We strive to give you quality time with your provider. You may need to reschedule your appointment if you arrive late (15 or more minutes).  Arriving late affects you and other patients whose appointments are after yours.  Also, if you miss three or more appointments without notifying the office, you may be dismissed from the clinic at the provider's discretion.      For prescription refill requests, have your pharmacy contact our office and allow 72 hours for refills to be completed.    Today you received the following chemotherapy and/or immunotherapy agents: Herceptin, Perjeta       To help prevent nausea and vomiting after your treatment, we encourage you to take your nausea medication as directed.  BELOW ARE SYMPTOMS THAT SHOULD BE REPORTED IMMEDIATELY: *FEVER GREATER THAN 100.4 F (38 C) OR HIGHER *CHILLS OR SWEATING *NAUSEA AND VOMITING THAT IS NOT CONTROLLED WITH YOUR NAUSEA MEDICATION *UNUSUAL SHORTNESS OF BREATH *UNUSUAL BRUISING OR BLEEDING *URINARY PROBLEMS (pain or burning when urinating, or frequent urination) *BOWEL PROBLEMS (unusual diarrhea, constipation, pain near the anus) TENDERNESS IN MOUTH AND THROAT WITH OR WITHOUT PRESENCE OF ULCERS (sore throat, sores in mouth, or a toothache) UNUSUAL RASH, SWELLING OR PAIN  UNUSUAL VAGINAL DISCHARGE OR ITCHING   Items with * indicate a potential emergency and should be followed up as soon as possible or go to the Emergency Department if any problems should occur.  Please show the CHEMOTHERAPY ALERT CARD or IMMUNOTHERAPY ALERT CARD  at check-in to the Emergency Department and triage nurse.  Should you have questions after your visit or need to cancel or reschedule your appointment, please contact [facility]  [phone] and follow the prompts.  Office hours are 8:00 a.m. to 4:30 p.m. Monday - Friday. Please note that voicemails left after 4:00 p.m. may not be returned until the following business day.  We are closed weekends and major holidays. You have access to a nurse at all times for urgent questions. Please call the main number to the clinic [phone] and follow the prompts.  For any non-urgent questions, you may also contact your provider using MyChart. We now offer e-Visits for anyone 20 and older to request care online for non-urgent symptoms. For details visit [email or web address].   Also download the MyChart app! Go to the app store, search "MyChart", open the app, select [facility], and log in with your MyChart username and password.  Due to Covid, a mask is required upon entering the hospital/clinic. If you do not have a mask, one will be given to you upon arrival. For doctor visits, patients may have 1 support person aged 50 or older with them. For treatment visits, patients cannot have anyone with them due to current Covid guidelines and our immunocompromised population. Pertuzumab injection What is this medication? PERTUZUMAB (per TOOZ ue mab) is a monoclonal antibody. It is used to treatbreast cancer. This medicine may be used for other purposes; ask your health care provider orpharmacist if you have questions. COMMON BRAND NAME(S): PERJETA What should I tell my care team before I take this medication?  They need to know if you have any of these conditions: heart disease heart failure high blood pressure history of irregular heart beat recent or ongoing radiation therapy an unusual or allergic reaction to pertuzumab, other medicines, foods, dyes, or  preservatives pregnant or trying to get pregnant breast-feeding How should I use this medication? This medicine is for infusion into a vein. It is given by a health careprofessional in a hospital or clinic setting. Talk to your pediatrician regarding the use of this medicine in children.Special care may be needed. Overdosage: If you think you have taken too much of this medicine contact apoison control center or emergency room at once. NOTE: This medicine is only for you. Do not share this medicine with others. What if I miss a dose? It is important not to miss your dose. Call your doctor or health careprofessional if you are unable to keep an appointment. What may interact with this medication? Interactions are not expected. Give your health care provider a list of all the medicines, herbs, non-prescription drugs, or dietary supplements you use. Also tell them if you smoke, drink alcohol, or use illegal drugs. Some items may interact with yourmedicine. This list may not describe all possible interactions. Give your health care provider a list of all the medicines, herbs, non-prescription drugs, or dietary supplements you use. Also tell them if you smoke, drink alcohol, or use illegaldrugs. Some items may interact with your medicine. What should I watch for while using this medication? Your condition will be monitored carefully while you are receiving this medicine. Report any side effects. Continue your course of treatment eventhough you feel ill unless your doctor tells you to stop. Do not become pregnant while taking this medicine or for 7 months after stopping it. Women should inform their doctor if they wish to become pregnant or think they might be pregnant. Women of child-bearing potential will need to have a negative pregnancy test before starting this medicine. There is a potential for serious side effects to an unborn child. Talk to your health care professional or pharmacist for more  information. Do not breast-feed an infantwhile taking this medicine or for 7 months after stopping it. Women must use effective birth control with this medicine. Call your doctor or health care professional for advice if you get a fever, chills or sore throat, or other symptoms of a cold or flu. Do not treatyourself. Try to avoid being around people who are sick. You may experience fever, chills, and headache during the infusion. Report anyside effects during the infusion to your health care professional. What side effects may I notice from receiving this medication? Side effects that you should report to your doctor or health care professionalas soon as possible: breathing problems chest pain or palpitations dizziness feeling faint or lightheaded fever or chills skin rash, itching or hives sore throat swelling of the face, lips, or tongue swelling of the legs or ankles unusually weak or tired Side effects that usually do not require medical attention (report to yourdoctor or health care professional if they continue or are bothersome): diarrhea hair loss nausea, vomiting tiredness This list may not describe all possible side effects. Call your doctor for medical advice about side effects. You may report side effects to FDA at1-800-FDA-1088. Where should I keep my medication? This drug is given in a hospital or clinic and will not be stored at home. NOTE: This sheet is a summary. It may not cover all possible information. If you have questions  about this medicine, talk to your doctor, pharmacist, orhealth care provider.  2022 Elsevier/Gold Standard (2015-12-31 12:08:50) Trastuzumab injection for infusion What is this medication? TRASTUZUMAB (tras TOO zoo mab) is a monoclonal antibody. It is used to treatbreast cancer and stomach cancer. This medicine may be used for other purposes; ask your health care provider orpharmacist if you have questions. COMMON BRAND NAME(S): Herceptin,  Theresa Chaney, Ontruzant, Trazimera What should I tell my care team before I take this medication? They need to know if you have any of these conditions: heart disease heart failure lung or breathing disease, like asthma an unusual or allergic reaction to trastuzumab, benzyl alcohol, or other medications, foods, dyes, or preservatives pregnant or trying to get pregnant breast-feeding How should I use this medication? This drug is given as an infusion into a vein. It is administered in a hospitalor clinic by a specially trained health care professional. Talk to your pediatrician regarding the use of this medicine in children. Thismedicine is not approved for use in children. Overdosage: If you think you have taken too much of this medicine contact apoison control center or emergency room at once. NOTE: This medicine is only for you. Do not share this medicine with others. What if I miss a dose? It is important not to miss a dose. Call your doctor or health careprofessional if you are unable to keep an appointment. What may interact with this medication? This medicine may interact with the following medications: certain types of chemotherapy, such as daunorubicin, doxorubicin, epirubicin, and idarubicin This list may not describe all possible interactions. Give your health care provider a list of all the medicines, herbs, non-prescription drugs, or dietary supplements you use. Also tell them if you smoke, drink alcohol, or use illegaldrugs. Some items may interact with your medicine. What should I watch for while using this medication? Visit your doctor for checks on your progress. Report any side effects. Continue your course of treatment even though you feel ill unless your doctortells you to stop. Call your doctor or health care professional for advice if you get a fever, chills or sore throat, or other symptoms of a cold or flu. Do not treatyourself. Try to avoid being around  people who are sick. You may experience fever, chills and shaking during your first infusion. These effects are usually mild and can be treated with other medicines. Report any side effects during the infusion to your health care professional. Fever andchills usually do not happen with later infusions. Do not become pregnant while taking this medicine or for 7 months after stopping it. Women should inform their doctor if they wish to become pregnant or think they might be pregnant. Women of child-bearing potential will need to have a negative pregnancy test before starting this medicine. There is a potential for serious side effects to an unborn child. Talk to your health care professional or pharmacist for more information. Do not breast-feed an infantwhile taking this medicine or for 7 months after stopping it. Women must use effective birth control with this medicine. What side effects may I notice from receiving this medication? Side effects that you should report to your doctor or health care professionalas soon as possible: allergic reactions like skin rash, itching or hives, swelling of the face, lips, or tongue chest pain or palpitations cough dizziness feeling faint or lightheaded, falls fever general ill feeling or flu-like symptoms signs of worsening heart failure like breathing problems; swelling in your legs and feet unusually weak  or tired Side effects that usually do not require medical attention (report to yourdoctor or health care professional if they continue or are bothersome): bone pain changes in taste diarrhea joint pain nausea/vomiting weight loss This list may not describe all possible side effects. Call your doctor for medical advice about side effects. You may report side effects to FDA at1-800-FDA-1088. Where should I keep my medication? This drug is given in a hospital or clinic and will not be stored at home. NOTE: This sheet is a summary. It may not cover all  possible information. If you have questions about this medicine, talk to your doctor, pharmacist, orhealth care provider.  2022 Elsevier/Gold Standard (2016-11-22 14:37:52)

## 2021-07-09 NOTE — Progress Notes (Signed)
Worsening mid back pain x2 days, worsens through out the day.

## 2021-07-15 ENCOUNTER — Other Ambulatory Visit: Payer: Self-pay

## 2021-07-15 ENCOUNTER — Inpatient Hospital Stay: Payer: Medicare HMO

## 2021-07-15 ENCOUNTER — Inpatient Hospital Stay: Payer: Medicare HMO | Attending: Oncology

## 2021-07-15 DIAGNOSIS — Z5112 Encounter for antineoplastic immunotherapy: Secondary | ICD-10-CM | POA: Diagnosis present

## 2021-07-15 DIAGNOSIS — C50411 Malignant neoplasm of upper-outer quadrant of right female breast: Secondary | ICD-10-CM | POA: Insufficient documentation

## 2021-07-15 DIAGNOSIS — Z79899 Other long term (current) drug therapy: Secondary | ICD-10-CM | POA: Insufficient documentation

## 2021-07-15 DIAGNOSIS — Z170 Estrogen receptor positive status [ER+]: Secondary | ICD-10-CM

## 2021-07-15 DIAGNOSIS — Z5111 Encounter for antineoplastic chemotherapy: Secondary | ICD-10-CM | POA: Diagnosis not present

## 2021-07-15 DIAGNOSIS — C7951 Secondary malignant neoplasm of bone: Secondary | ICD-10-CM | POA: Diagnosis not present

## 2021-07-15 LAB — COMPREHENSIVE METABOLIC PANEL
ALT: 24 U/L (ref 0–44)
AST: 30 U/L (ref 15–41)
Albumin: 4.5 g/dL (ref 3.5–5.0)
Alkaline Phosphatase: 77 U/L (ref 38–126)
Anion gap: 8 (ref 5–15)
BUN: 15 mg/dL (ref 6–20)
CO2: 24 mmol/L (ref 22–32)
Calcium: 9.5 mg/dL (ref 8.9–10.3)
Chloride: 104 mmol/L (ref 98–111)
Creatinine, Ser: 0.95 mg/dL (ref 0.44–1.00)
GFR, Estimated: >60 mL/min (ref >60)
Glucose, Bld: 103 mg/dL — ABNORMAL HIGH (ref 70–99)
Potassium: 3.3 mmol/L — ABNORMAL LOW (ref 3.5–5.1)
Sodium: 136 mmol/L (ref 135–145)
Total Bilirubin: 0.5 mg/dL (ref 0.3–1.2)
Total Protein: 7.7 g/dL (ref 6.5–8.1)

## 2021-07-15 LAB — CBC WITH DIFFERENTIAL/PLATELET
Abs Immature Granulocytes: 0.02 10*3/uL (ref 0.00–0.07)
Basophils Absolute: 0 10*3/uL (ref 0.0–0.1)
Basophils Relative: 0 %
Eosinophils Absolute: 0.1 10*3/uL (ref 0.0–0.5)
Eosinophils Relative: 2 %
HCT: 34.3 % — ABNORMAL LOW (ref 36.0–46.0)
Hemoglobin: 11.5 g/dL — ABNORMAL LOW (ref 12.0–15.0)
Immature Granulocytes: 0 %
Lymphocytes Relative: 29 %
Lymphs Abs: 2 10*3/uL (ref 0.7–4.0)
MCH: 29.9 pg (ref 26.0–34.0)
MCHC: 33.5 g/dL (ref 30.0–36.0)
MCV: 89.1 fL (ref 80.0–100.0)
Monocytes Absolute: 0.7 10*3/uL (ref 0.1–1.0)
Monocytes Relative: 10 %
Neutro Abs: 4 10*3/uL (ref 1.7–7.7)
Neutrophils Relative %: 59 %
Platelets: 280 10*3/uL (ref 150–400)
RBC: 3.85 MIL/uL — ABNORMAL LOW (ref 3.87–5.11)
RDW: 13.1 % (ref 11.5–15.5)
WBC: 6.8 10*3/uL (ref 4.0–10.5)
nRBC: 0 % (ref 0.0–0.2)

## 2021-07-15 LAB — SEDIMENTATION RATE: Sed Rate: 37 mm/hr — ABNORMAL HIGH (ref 0–20)

## 2021-07-15 LAB — MAGNESIUM: Magnesium: 1.9 mg/dL (ref 1.7–2.4)

## 2021-07-15 MED ORDER — SODIUM CHLORIDE 0.9% FLUSH
10.0000 mL | Freq: Once | INTRAVENOUS | Status: DC
  Filled 2021-07-15: qty 10

## 2021-07-15 MED ORDER — HEPARIN SOD (PORK) LOCK FLUSH 100 UNIT/ML IV SOLN
500.0000 [IU] | Freq: Once | INTRAVENOUS | Status: AC
  Administered 2021-07-15: 500 [IU] via INTRAVENOUS
  Filled 2021-07-15: qty 5

## 2021-07-15 MED ORDER — ZOLEDRONIC ACID 4 MG/100ML IV SOLN
4.0000 mg | Freq: Once | INTRAVENOUS | Status: AC
  Administered 2021-07-15: 4 mg via INTRAVENOUS
  Filled 2021-07-15: qty 100

## 2021-07-15 MED ORDER — FULVESTRANT 250 MG/5ML IM SOLN
500.0000 mg | INTRAMUSCULAR | Status: DC
  Administered 2021-07-15: 500 mg via INTRAMUSCULAR
  Filled 2021-07-15: qty 10

## 2021-07-15 MED ORDER — HEPARIN SOD (PORK) LOCK FLUSH 100 UNIT/ML IV SOLN
INTRAVENOUS | Status: AC
  Filled 2021-07-15: qty 5

## 2021-07-15 MED ORDER — SODIUM CHLORIDE 0.9 % IV SOLN
INTRAVENOUS | Status: DC
  Filled 2021-07-15: qty 250

## 2021-07-15 NOTE — Patient Instructions (Signed)
[facility]  Discharge Instructions: Thank you for choosing [facility] to provide your oncology and hematology care.  If you have a lab appointment with the [facility], please go directly to the [facility] and check in at the registration area.  Wear comfortable clothing and clothing appropriate for easy access to any Portacath or PICC line.   We strive to give you quality time with your provider. You may need to reschedule your appointment if you arrive late (15 or more minutes).  Arriving late affects you and other patients whose appointments are after yours.  Also, if you miss three or more appointments without notifying the office, you may be dismissed from the clinic at the provider's discretion.      For prescription refill requests, have your pharmacy contact our office and allow 72 hours for refills to be completed.    Today you received the following chemotherapy and/or immunotherapy agents : Zometa / Faslodex     To help prevent nausea and vomiting after your treatment, we encourage you to take your nausea medication as directed.  BELOW ARE SYMPTOMS THAT SHOULD BE REPORTED IMMEDIATELY: *FEVER GREATER THAN 100.4 F (38 C) OR HIGHER *CHILLS OR SWEATING *NAUSEA AND VOMITING THAT IS NOT CONTROLLED WITH YOUR NAUSEA MEDICATION *UNUSUAL SHORTNESS OF BREATH *UNUSUAL BRUISING OR BLEEDING *URINARY PROBLEMS (pain or burning when urinating, or frequent urination) *BOWEL PROBLEMS (unusual diarrhea, constipation, pain near the anus) TENDERNESS IN MOUTH AND THROAT WITH OR WITHOUT PRESENCE OF ULCERS (sore throat, sores in mouth, or a toothache) UNUSUAL RASH, SWELLING OR PAIN  UNUSUAL VAGINAL DISCHARGE OR ITCHING   Items with * indicate a potential emergency and should be followed up as soon as possible or go to the Emergency Department if any problems should occur.  Please show the CHEMOTHERAPY ALERT CARD or IMMUNOTHERAPY ALERT CARD at  check-in to the Emergency Department and triage nurse.  Should you have questions after your visit or need to cancel or reschedule your appointment, please contact [facility]  [phone] and follow the prompts.  Office hours are 8:00 a.m. to 4:30 p.m. Monday - Friday. Please note that voicemails left after 4:00 p.m. may not be returned until the following business day.  We are closed weekends and major holidays. You have access to a nurse at all times for urgent questions. Please call the main number to the clinic [phone] and follow the prompts.  For any non-urgent questions, you may also contact your provider using MyChart. We now offer e-Visits for anyone 43 and older to request care online for non-urgent symptoms. For details visit [email or web address].   Also download the MyChart app! Go to the app store, search "MyChart", open the app, select [facility], and log in with your MyChart username and password.  Due to Covid, a mask is required upon entering the hospital/clinic. If you do not have a mask, one will be given to you upon arrival. For doctor visits, patients may have 1 support person aged 31 or older with them. For treatment visits, patients cannot have anyone with them due to current Covid guidelines and our immunocompromised population.

## 2021-07-20 ENCOUNTER — Other Ambulatory Visit: Payer: Self-pay

## 2021-07-20 ENCOUNTER — Ambulatory Visit
Admission: RE | Admit: 2021-07-20 | Discharge: 2021-07-20 | Disposition: A | Discharge status: Home / Self Care | Payer: Medicare HMO | Source: Ambulatory Visit | Attending: Oncology | Admitting: Oncology

## 2021-07-20 DIAGNOSIS — C50919 Malignant neoplasm of unspecified site of unspecified female breast: Secondary | ICD-10-CM | POA: Insufficient documentation

## 2021-07-20 DIAGNOSIS — C7951 Secondary malignant neoplasm of bone: Secondary | ICD-10-CM | POA: Diagnosis present

## 2021-07-20 IMAGING — MR MR THORACIC SPINE WO/W CM
6 of 9 series · 28 of 48 positions shown · IV contrast (7.5ml Gadavist)
Comparison: MRI of the thoracic spine [DATE].

CLINICAL DATA: Metastatic breast cancer. Back pain. Bone
metastasis.

EXAM:
MRI THORACIC WITHOUT AND WITH CONTRAST
TECHNIQUE: Multiplanar and multiecho pulse sequences of the thoracic spine were
obtained without and with intravenous contrast.
CONTRAST:  7.5mL GADAVIST GADOBUTROL 1 MMOL/ML IV SOLN

[Series 17: T2 · sagittal · 3.0mm · 1.06mm/px · 3 of 17 slices shown (1 of 2)]
[im 1/17]
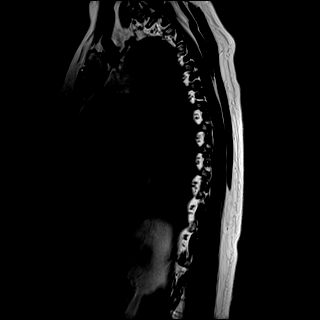
[im 9/17]
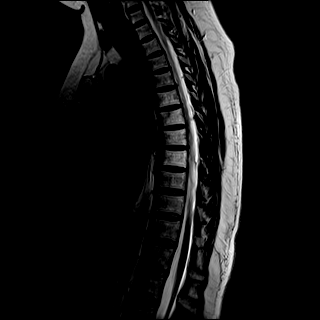
[im 17/17]
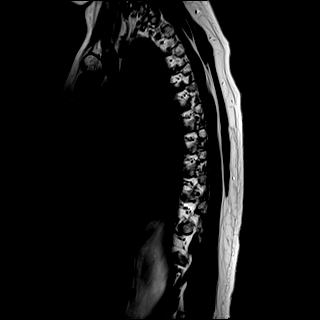

[Series 18: T1 · sagittal · 5.0mm · 1.41mm/px · 1 of 9 slices shown (1 of 3)]
[im 1/9]
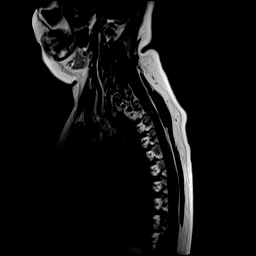

[Series 19: T1 · sagittal · 3.0mm · 1.06mm/px · 4 of 17 slices shown (2 of 3)]
[im 1/17]
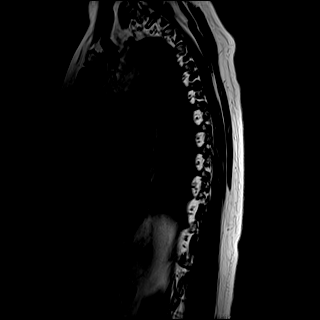
[im 6/17]
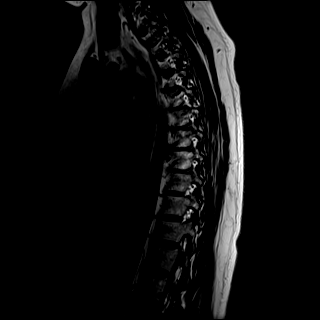
[im 11/17]
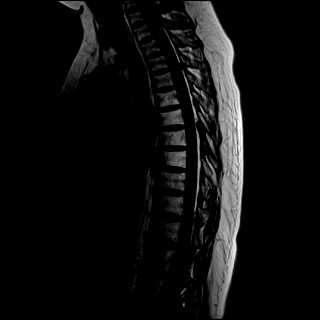
[im 17/17]
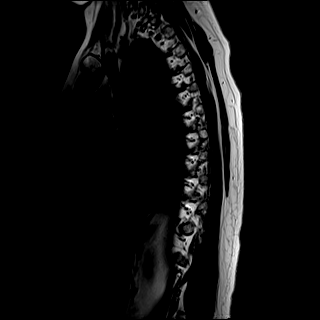

[Series 21: T2 · axial · 4.0mm · 0.59mm/px · z∈[-161,+59]mm · 8 of 39 slices shown (2 of 2)]
[im 1/39]
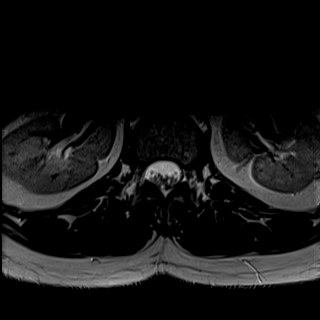
[im 6/39]
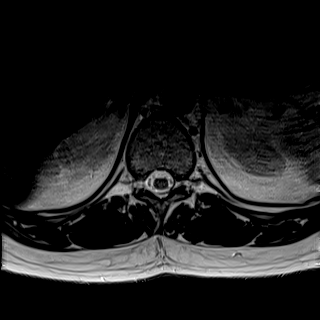
[im 11/39]
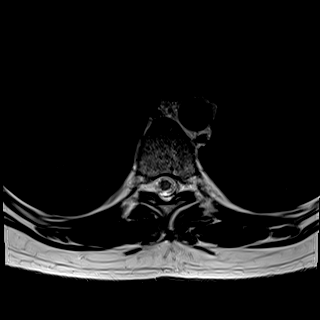
[im 17/39]
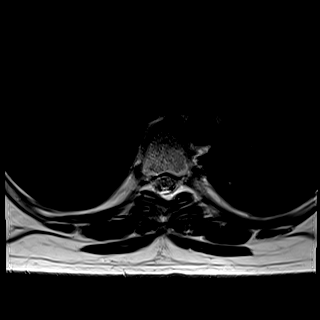
[im 22/39]
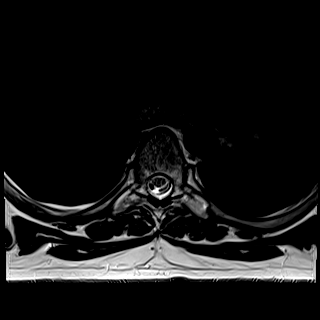
[im 28/39]
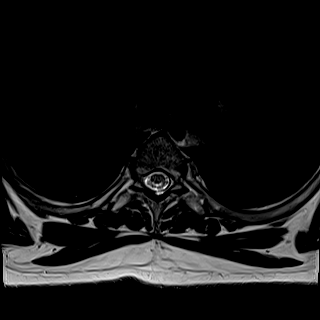
[im 33/39]
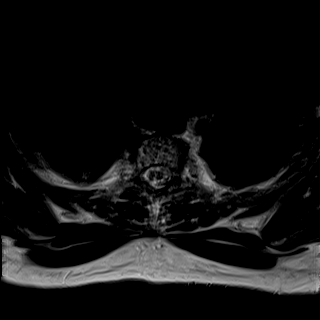
[im 39/39]
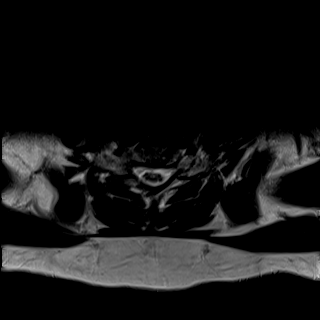

[Series 23: T1 · axial · non-contrast · 4.0mm · 0.31mm/px · z∈[-161,+59]mm · 8 of 39 slices shown (3 of 3)]
[im 1/39]
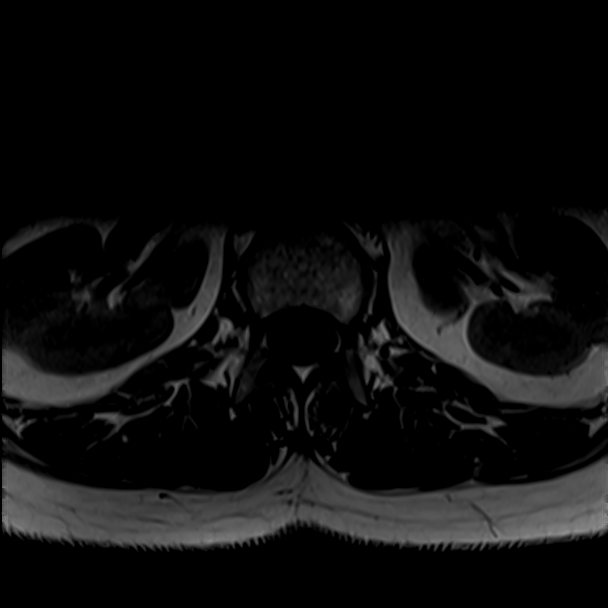
[im 6/39]
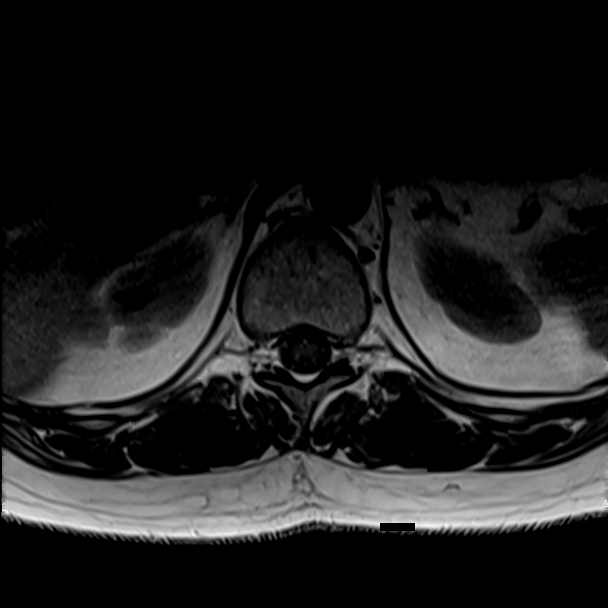
[im 11/39]
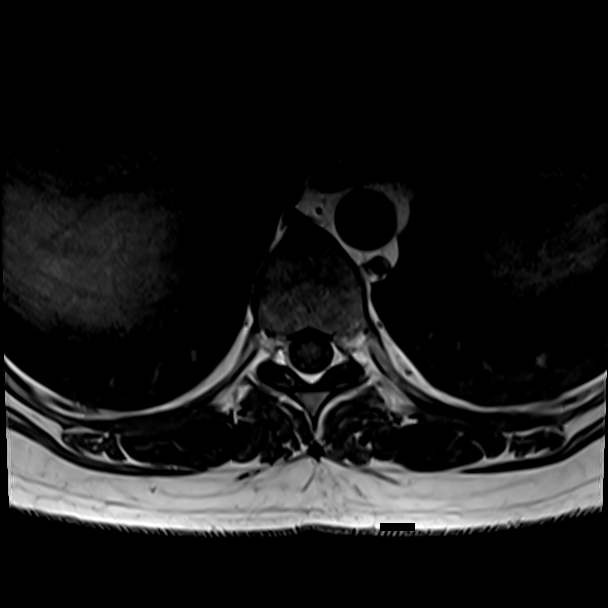
[im 17/39]
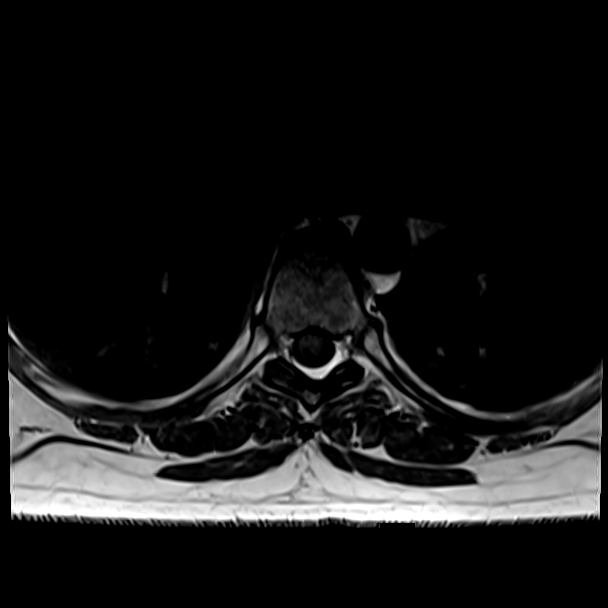
[im 22/39]
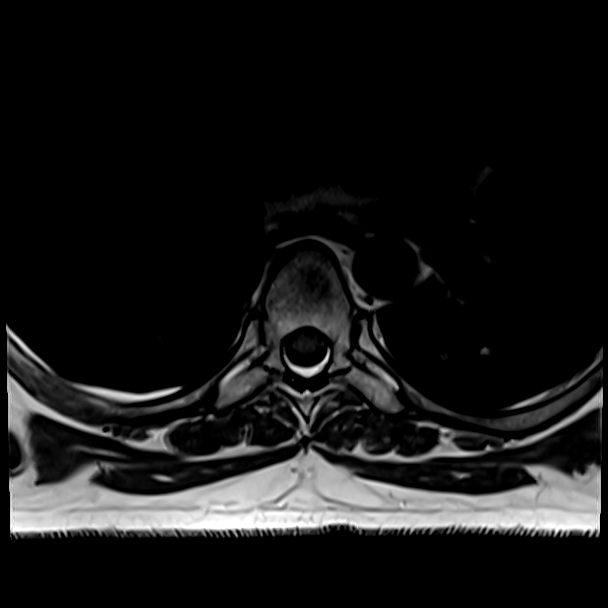
[im 28/39]
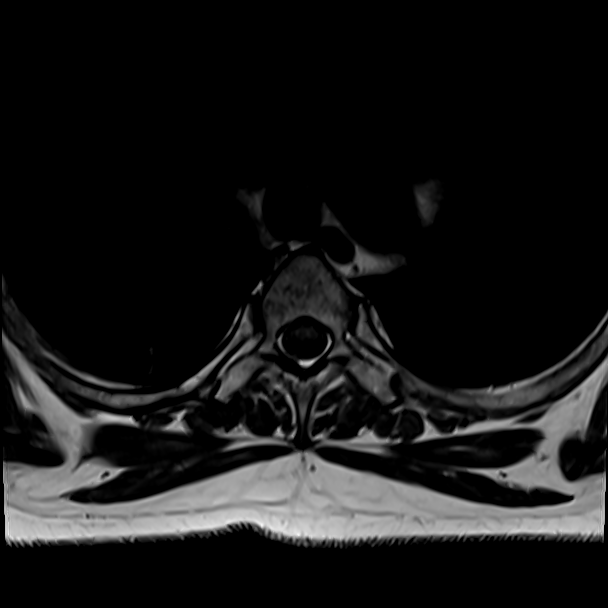
[im 33/39]
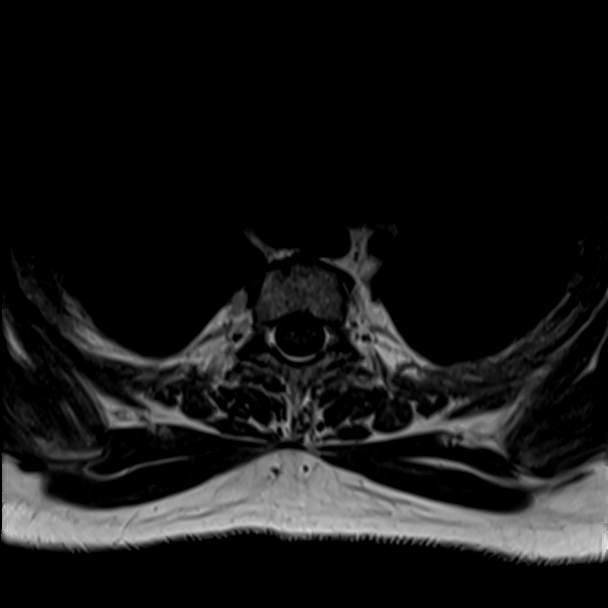
[im 39/39]
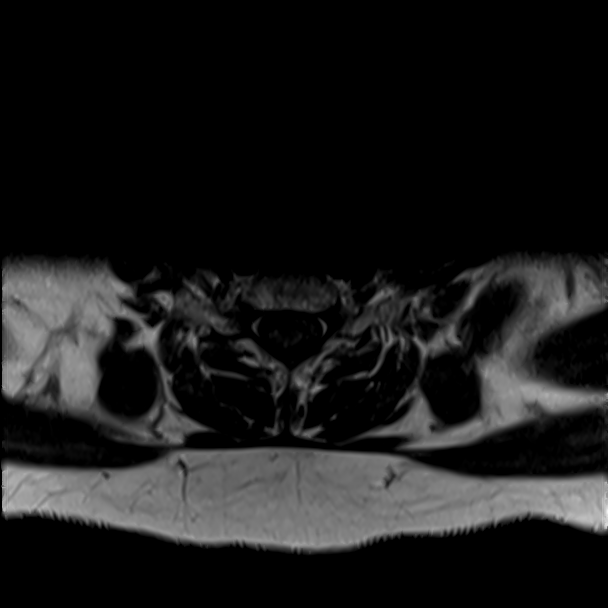

[Series 25: T1 fat-sat post-contrast · sagittal · 3.0mm · 1.06mm/px · 4 of 17 slices shown]
[im 1/17]
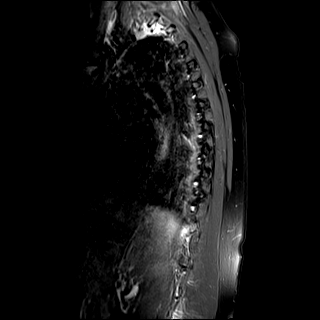
[im 6/17]
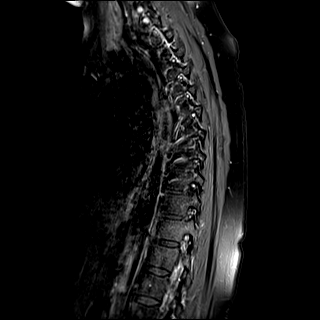
[im 11/17]
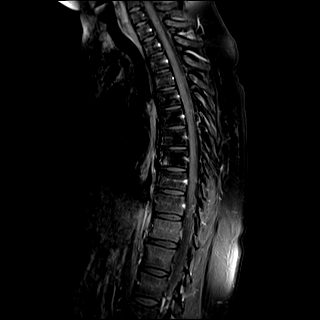
[im 17/17]
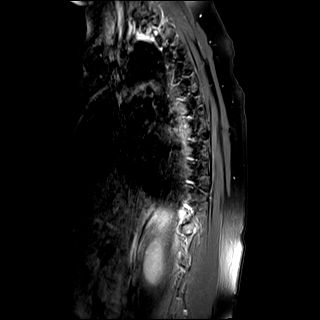

[28 of 48 positions shown; findings below may reference images not displayed]

FINDINGS: Alignment:  Physiologic.

Vertebrae: Interval resolution of the increased T2 signal and
contrast enhancement in the posterior elements of T7. No flow new
focus of abnormal contrast enhancement identified. No fracture,
evidence of discitis, or aggressive bone lesion.

Cord:  Normal signal and morphology.

Paraspinal and other soft tissues: 8 mm right thyroid lobe nodule.
Paraspinal soft tissues are unremarkable.

Disc levels:

Again seen is ossification of the ligamentum flavum at C6-7. No
significant disc herniation, spinal canal or neural foraminal
stenosis at any level.
IMPRESSION: 1. Interval resolution of the increased T2 signal contrast
enhancement in the posterior elements of T7. No new lesion
identified.
2. No spinal canal or neural foraminal stenosis.

## 2021-07-20 MED ORDER — GADOBUTROL 1 MMOL/ML IV SOLN
7.5000 mL | Freq: Once | INTRAVENOUS | Status: AC | PRN
  Administered 2021-07-20: 7.5 mL via INTRAVENOUS

## 2021-07-23 ENCOUNTER — Encounter: Payer: Self-pay | Admitting: Oncology

## 2021-07-23 NOTE — Telephone Encounter (Signed)
Please advise 

## 2021-07-26 ENCOUNTER — Other Ambulatory Visit: Payer: Self-pay

## 2021-07-26 DIAGNOSIS — E041 Nontoxic single thyroid nodule: Secondary | ICD-10-CM

## 2021-07-26 NOTE — Telephone Encounter (Signed)
Seems like pt has more questions/ concerns did you want to call her or schedule her for a Mychart visit to go over results?

## 2021-07-26 NOTE — Telephone Encounter (Signed)
Can one of you please schedule US thyroid- first available and notify pt of appt please.

## 2021-07-30 ENCOUNTER — Other Ambulatory Visit: Payer: Self-pay

## 2021-07-30 ENCOUNTER — Encounter: Payer: Self-pay | Admitting: Oncology

## 2021-07-30 ENCOUNTER — Inpatient Hospital Stay: Payer: Medicare HMO

## 2021-07-30 ENCOUNTER — Inpatient Hospital Stay (HOSPITAL_BASED_OUTPATIENT_CLINIC_OR_DEPARTMENT_OTHER): Payer: Medicare HMO | Admitting: Oncology

## 2021-07-30 VITALS — BP 119/74 | HR 101 | Temp 97.7°F | Wt 180.0 lb

## 2021-07-30 DIAGNOSIS — E041 Nontoxic single thyroid nodule: Secondary | ICD-10-CM

## 2021-07-30 DIAGNOSIS — R634 Abnormal weight loss: Secondary | ICD-10-CM

## 2021-07-30 DIAGNOSIS — C50811 Malignant neoplasm of overlapping sites of right female breast: Secondary | ICD-10-CM

## 2021-07-30 DIAGNOSIS — C50919 Malignant neoplasm of unspecified site of unspecified female breast: Secondary | ICD-10-CM | POA: Diagnosis not present

## 2021-07-30 DIAGNOSIS — Z5111 Encounter for antineoplastic chemotherapy: Secondary | ICD-10-CM | POA: Diagnosis not present

## 2021-07-30 DIAGNOSIS — Z170 Estrogen receptor positive status [ER+]: Secondary | ICD-10-CM

## 2021-07-30 DIAGNOSIS — Z5112 Encounter for antineoplastic immunotherapy: Secondary | ICD-10-CM

## 2021-07-30 DIAGNOSIS — C7951 Secondary malignant neoplasm of bone: Secondary | ICD-10-CM

## 2021-07-30 DIAGNOSIS — C50411 Malignant neoplasm of upper-outer quadrant of right female breast: Secondary | ICD-10-CM

## 2021-07-30 LAB — CBC WITH DIFFERENTIAL/PLATELET
Abs Immature Granulocytes: 0.03 10*3/uL (ref 0.00–0.07)
Basophils Absolute: 0.1 10*3/uL (ref 0.0–0.1)
Basophils Relative: 1 %
Eosinophils Absolute: 0.2 10*3/uL (ref 0.0–0.5)
Eosinophils Relative: 2 %
HCT: 33.2 % — ABNORMAL LOW (ref 36.0–46.0)
Hemoglobin: 11 g/dL — ABNORMAL LOW (ref 12.0–15.0)
Immature Granulocytes: 0 %
Lymphocytes Relative: 22 %
Lymphs Abs: 1.9 10*3/uL (ref 0.7–4.0)
MCH: 29.6 pg (ref 26.0–34.0)
MCHC: 33.1 g/dL (ref 30.0–36.0)
MCV: 89.5 fL (ref 80.0–100.0)
Monocytes Absolute: 0.6 10*3/uL (ref 0.1–1.0)
Monocytes Relative: 7 %
Neutro Abs: 6 10*3/uL (ref 1.7–7.7)
Neutrophils Relative %: 68 %
Platelets: 304 10*3/uL (ref 150–400)
RBC: 3.71 MIL/uL — ABNORMAL LOW (ref 3.87–5.11)
RDW: 13.1 % (ref 11.5–15.5)
WBC: 8.7 10*3/uL (ref 4.0–10.5)
nRBC: 0 % (ref 0.0–0.2)

## 2021-07-30 LAB — COMPREHENSIVE METABOLIC PANEL
ALT: 19 U/L (ref 0–44)
AST: 25 U/L (ref 15–41)
Albumin: 4.2 g/dL (ref 3.5–5.0)
Alkaline Phosphatase: 64 U/L (ref 38–126)
Anion gap: 9 (ref 5–15)
BUN: 14 mg/dL (ref 6–20)
CO2: 23 mmol/L (ref 22–32)
Calcium: 9.2 mg/dL (ref 8.9–10.3)
Chloride: 106 mmol/L (ref 98–111)
Creatinine, Ser: 0.86 mg/dL (ref 0.44–1.00)
GFR, Estimated: >60 mL/min (ref >60)
Glucose, Bld: 147 mg/dL — ABNORMAL HIGH (ref 70–99)
Potassium: 3.3 mmol/L — ABNORMAL LOW (ref 3.5–5.1)
Sodium: 138 mmol/L (ref 135–145)
Total Bilirubin: 0.3 mg/dL (ref 0.3–1.2)
Total Protein: 7.5 g/dL (ref 6.5–8.1)

## 2021-07-30 LAB — T4, FREE: Free T4: 0.69 ng/dL (ref 0.61–1.12)

## 2021-07-30 LAB — TSH: TSH: 0.819 u[IU]/mL (ref 0.350–4.500)

## 2021-07-30 LAB — MAGNESIUM: Magnesium: 2.1 mg/dL (ref 1.7–2.4)

## 2021-07-30 MED ORDER — SODIUM CHLORIDE 0.9 % IV SOLN
420.0000 mg | Freq: Once | INTRAVENOUS | Status: AC
  Administered 2021-07-30: 420 mg via INTRAVENOUS
  Filled 2021-07-30: qty 14

## 2021-07-30 MED ORDER — DIPHENHYDRAMINE HCL 25 MG PO CAPS
50.0000 mg | ORAL_CAPSULE | Freq: Once | ORAL | Status: AC
  Administered 2021-07-30: 50 mg via ORAL
  Filled 2021-07-30: qty 2

## 2021-07-30 MED ORDER — HEPARIN SOD (PORK) LOCK FLUSH 100 UNIT/ML IV SOLN
INTRAVENOUS | Status: AC
  Filled 2021-07-30: qty 5

## 2021-07-30 MED ORDER — TRASTUZUMAB-ANNS CHEMO 150 MG IV SOLR
6.0000 mg/kg | Freq: Once | INTRAVENOUS | Status: AC
  Administered 2021-07-30: 504 mg via INTRAVENOUS
  Filled 2021-07-30: qty 24

## 2021-07-30 MED ORDER — SODIUM CHLORIDE 0.9% FLUSH
10.0000 mL | INTRAVENOUS | Status: DC | PRN
  Administered 2021-07-30: 10 mL via INTRAVENOUS
  Filled 2021-07-30: qty 10

## 2021-07-30 MED ORDER — SODIUM CHLORIDE 0.9 % IV SOLN
Freq: Once | INTRAVENOUS | Status: AC
  Filled 2021-07-30: qty 250

## 2021-07-30 MED ORDER — ACETAMINOPHEN 325 MG PO TABS
650.0000 mg | ORAL_TABLET | Freq: Once | ORAL | Status: AC
  Administered 2021-07-30: 650 mg via ORAL
  Filled 2021-07-30: qty 2

## 2021-07-30 MED ORDER — HEPARIN SOD (PORK) LOCK FLUSH 100 UNIT/ML IV SOLN
500.0000 [IU] | Freq: Once | INTRAVENOUS | Status: AC
Start: 2021-07-30 — End: 2021-07-30
  Administered 2021-07-30: 500 [IU] via INTRAVENOUS
  Filled 2021-07-30: qty 5

## 2021-07-30 MED ORDER — HEPARIN SOD (PORK) LOCK FLUSH 100 UNIT/ML IV SOLN
500.0000 [IU] | Freq: Once | INTRAVENOUS | Status: DC | PRN
  Filled 2021-07-30: qty 5

## 2021-07-30 NOTE — Progress Notes (Signed)
Hematology/Oncology Follow Up Note [place] Telephone:(336) [phone] Fax:(336) [phone]  Patient Care Team: Marguerita Merles, MD as PCP - General (Family Medicine) End, Harrell Gave, MD as PCP - Cardiology (Cardiology) Rico Junker, RN as Oncology Nurse Navigator Earlie Server, MD as Consulting Physician (Oncology) Bary Castilla, Forest Gleason, MD (General Surgery) Noreene Filbert, MD as Referring Physician (Radiation Oncology) Noreene Filbert, MD as Radiation Oncologist (Radiation Oncology) Noreene Filbert, MD as Radiation Oncologist (Radiation Oncology)   Name of the patient: Theresa Chaney  [id number]  [birth year]   Date of visit: 07/30/21 REASON FOR VISIT Follow up for  treatment of breast cancer  Oncology History Cancer TREATMENT Neoadjuvant ddAC +one dose of Taxol, due to lack of response, surgery was offered. Case was discussed on breast tumor board. 03/19/2018 S/p right mastectomy and right axillary dissection, immediate breast reconstruction with placement of expanders.  ypT3 ypN2, + lymphovascular invasion,  Grade 3, margin is negative, close. ER 90%, PR 0%, HER2 IHC negative.  Also had elective bilateral salpingo-oophorectomy..  # 06/11/2018 s/p 11 cycles Taxol adjuvantly. Tolerated well.  # She has obtained dental clearance for starting Zometa.  S/p Zometa on 6/3/ 2019 # s/p adjuvant right chest wall radiation, finished 10/10/2018 #06/03/2019 underwent elective left prophylactic mastectomy and sentinel lymph node biopsy of left axilla.  # 06/03/2019 underwent elective left prophylactic mastectomy and sentinel lymph node biopsy of left axilla. Pathology negative for malignancy. Status post Mediport removal on 06/03/2019. She also underwent right implant removal on 06/03/2019. She also underwent right implant removal on 06/03/2019. #Negative genetic testing  #  chemotherapy-induced neuropathy, bilateral fingertips and lower extremities.  Patient is on Lyrica and  nortriptyline.  Follows up with neurology. She follows up with lymphedema clinic.  #  07/07/2020, MRI thoracic spine without contrast showed lesions involving the T7 posterior elements most concerning for metastatic lesion.  No evidence of epidural tumor.  Minimal thoracic spondylosis without stenosis. MRI was reviewed by me and a PET scan was obtained for further evaluation. 07/20/2020, PET scan showed hypermetabolic metastasis involving the posterior element of T7, no additional evidence of metastasis in the neck, chest, abdomen or pelvis.  # 07/29/2020 T7 lesion biopsy showed metastatic carcinoma, compatible with breast origin.  Receptor status staining showed ER 71-80% positive, PR negative, HER-2 positive IHC 3+ # Patient finished spine radiation on 08/31/2020   positive for COVID-19 on 01/11/2021  # 03/15/2021, PET scan showed no focal hypermetabolic activity to suggest skeletal metastasis.  Mild hypermetabolic activity along the right T7-8 paraspinal musculature.  Max SUV 3.3.  Likely postprocedural-  #Lower extremity cramps when she eats banana or oral potassium supplementation.not able to tolerate po potassium.   INTERVAL HISTORY 40 yo female with above oncology history reviewed by me presents for follow-up of management of metastatic breast cancer.  Chronic intermittent back pain, symptoms is worse. She has had MRI done recently.  Weight loss +   Review of Systems  Constitutional:  Positive for weight loss. Negative for chills, fever and malaise/fatigue.  HENT:  Negative for sore throat.   Eyes:  Negative for redness.  Respiratory:  Negative for cough, shortness of breath and wheezing.   Cardiovascular:  Negative for chest pain, palpitations and leg swelling.  Gastrointestinal:  Negative for abdominal pain, blood in stool, heartburn, nausea and vomiting.  Genitourinary:  Negative for dysuria.  Musculoskeletal:  Positive for back pain and joint pain. Negative for myalgias.  Skin:   Negative for rash.  Neurological:  Positive for  tingling. Negative for dizziness and tremors.  Endo/Heme/Allergies:  Does not bruise/bleed easily.  Psychiatric/Behavioral:  Negative for hallucinations.    No Known Allergies  Patient Active Problem List   Diagnosis Date Noted   Tachycardia, unspecified 04/15/2021   Bone metastasis ([place]) 03/25/2021   Encounter for monoclonal antibody treatment for malignancy 12/10/2020   Bone lesion 11/19/2020   Hypocalcemia 11/19/2020   Inflammatory arthritis 11/19/2020   Encounter for antineoplastic chemotherapy 10/29/2020   Metastatic breast cancer ([place]) 08/31/2020   Goals of care, counseling/discussion 08/11/2020   Neuropathy due to chemotherapeutic drug ([place]) 08/11/2020   HER2-positive carcinoma of breast ([place]) 08/11/2020   Rheumatoid arteritis ([place]) 10/13/2019   Bilateral hand swelling 10/03/2019   Fracture of neck of metacarpal bone 05/14/2019   Chronic fatigue 04/09/2019   Polyarthralgia 04/09/2019   Status post right breast reconstruction 02/26/2019   Status post right mastectomy 02/26/2019   Mastalgia 02/15/2019   Shortness of breath 08/23/2018   Nonischemic cardiomyopathy ([place]) 08/23/2018   Preprocedural cardiovascular examination 08/23/2018   Tachycardia 08/23/2018   Palpitations 08/23/2018   Estrogen receptor positive status (ER+) 04/04/2018   Acquired absence of right breast and nipple 04/03/2018   Breast cancer of upper-outer quadrant of right female breast ([place]) 03/19/2018   Family history of cancer    Malignant neoplasm of overlapping sites of right breast in female, estrogen receptor positive ([place]) 10/19/2017   Gastroesophageal reflux disease without esophagitis 02/24/2017   Generalized anxiety disorder 10/03/2014   Headache 10/03/2014     Past Medical History:  Diagnosis Date   Anemia    Arthritis    BRCA negative 11/26/2017   Breast cancer ([place]) 10/11/2017   Multifocal, ER positive, PR negative, HER-2 negative. ypT3  ypN2a 8.7 cm, 4/15 nodes   Cardiomyopathy ([place])    a. 10/2017 Echo: EF 60-65%, no rwma, Gr1 DD, nl RV size/fxn; b. 04/2018 Echo: EF 55-60%, no rwma, Nl RV size/fxn; c. 08/2018 Echo: EF 45%, diff HK, ? HK of antsept wall. Gr1 DD. Mild MR. Mild LAE/RAE. Mod dil RV.    Chronic bronchitis ([place]) 11/2017   COPD (chronic obstructive pulmonary disease) ([facility])    MILD PER CXR   Depression    Family history of cancer    GERD (gastroesophageal reflux disease)    Headache    MIGRAINES   Heart murmur    ASYMPTOMATIC   Personal history of chemotherapy    current for right breast ca     Past Surgical History:  Procedure Laterality Date   AXILLARY LYMPH NODE DISSECTION Right 03/19/2018   Procedure: AXILLARY LYMPH NODE DISSECTION;  Surgeon: Robert Bellow, MD;  Location: [facility];  Service: General;  Laterality: Right;   BREAST BIOPSY Right 10/11/2017   12:30 posterior coil clip invasive mammary carcinoma   BREAST BIOPSY Right 10/11/2017   11:30 middle depth ribbon clip DCIS   BREAST BIOPSY Right 10/11/2017   5:30 anterior depth x shape invasive ductal carcinoma   BREAST IMPLANT REMOVAL Right 06/03/2019   Procedure: REMOVAL OF RIGHT BREAST IMPLANTS;  Surgeon: Wallace Going, DO;  Location: [facility];  Service: Plastics;  Laterality: Right;   BREAST RECONSTRUCTION WITH PLACEMENT OF TISSUE EXPANDER AND FLEX HD (ACELLULAR HYDRATED DERMIS) Right 03/19/2018   Procedure: BREAST RECONSTRUCTION WITH PLACEMENT OF TISSUE EXPANDER AND FLEX HD (ACELLULAR HYDRATED DERMIS);  Surgeon: Wallace Going, DO;  Location: [facility];  Service: Plastics;  Laterality: Right;   CARPAL TUNNEL RELEASE Bilateral 2020   CHOLECYSTECTOMY N/A 04/27/2020  Procedure: LAPAROSCOPIC CHOLECYSTECTOMY WITH INTRAOPERATIVE CHOLANGIOGRAM;  Surgeon: Robert Bellow, MD;  Location: [facility];  Service: General;  Laterality: N/A;   ESOPHAGOGASTRODUODENOSCOPY (EGD) WITH PROPOFOL N/A 04/17/2020   Procedure: ESOPHAGOGASTRODUODENOSCOPY (EGD)  WITH PROPOFOL;  Surgeon: Robert Bellow, MD;  Location: [facility];  Service: Endoscopy;  Laterality: N/A;  with biopsy   LAPAROSCOPIC BILATERAL SALPINGO OOPHERECTOMY Bilateral 03/19/2018   Procedure: LAPAROSCOPIC BILATERAL SALPINGO OOPHORECTOMY;  Surgeon: Benjaman Kindler, MD;  Location: [facility];  Service: Gynecology;  Laterality: Bilateral;   MASTECTOMY Right 03/2018   MASTECTOMY W/ SENTINEL NODE BIOPSY Right 03/19/2018   Procedure: MASTECTOMY WITH SENTINEL LYMPH NODE BIOPSY;  Surgeon: Robert Bellow, MD;  Location: [facility];  Service: General;  Laterality: Right;   PORT-A-CATH REMOVAL Left 06/03/2019   Procedure: REMOVAL PORT-A-CATH;  Surgeon: Robert Bellow, MD;  Location: [facility];  Service: General;  Laterality: Left;   PORTACATH PLACEMENT Left 10/24/2017   Procedure: INSERTION PORT-A-CATH;  Surgeon: Robert Bellow, MD;  Location: [facility];  Service: General;  Laterality: Left;   PORTACATH PLACEMENT Right 09/28/2020   Procedure: INSERTION PORT-A-CATH;  Surgeon: Robert Bellow, MD;  Location: [facility];  Service: General;  Laterality: Right;   REMOVAL OF TISSUE EXPANDER AND PLACEMENT OF IMPLANT Right 07/20/2018   Procedure: REMOVAL OF RIGHT BREAST TISSUE EXPANDER AND PLACEMENT OF IMPLANT;  Surgeon: Wallace Going, DO;  Location: [facility];  Service: Plastics;  Laterality: Right;   SIMPLE MASTECTOMY WITH AXILLARY SENTINEL NODE BIOPSY Left 06/03/2019   Procedure: SIMPLE MASTECTOMY LEFT;  Surgeon: Robert Bellow, MD;  Location: [facility];  Service: General;  Laterality: Left;    Social History   Socioeconomic History   Marital status: Married    Spouse name: Not on file   Number of children: Not on file   Years of education: Not on file   Highest education level: Not on file  Occupational History   Occupation: pharmacy tech    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 [employer]   Tobacco Use   Smoking status: Every Day    Packs/day: 0.50     Years: 18.00    Pack years: 9.00    Types: Cigarettes    Start date: 06/21/2018   Smokeless tobacco: Never  Vaping Use   Vaping Use: Never used  Substance and Sexual Activity   Alcohol use: No   Drug use: No   Sexual activity: Yes    Birth control/protection: Injection, Other-see comments    Comment: has had hysterectomy  Other Topics Concern   Not on file  Social History Narrative   Lives at home with husband and daughter   Social Determinants of Health   Financial Resource Strain: Not on file  Food Insecurity: Not on file  Transportation Needs: Not on file  Physical Activity: Not on file  Stress: Not on file  Social Connections: Not on file  Intimate Partner Violence: Not on file     Family History  Problem Relation Age of Onset   Melanoma Maternal Aunt        other aunts with BCC/SCC/Melanoma   Diabetes Father    Hypertension Father    Hyperlipidemia Father    Heart attack Father 54       "mild"   Bladder Cancer Maternal Grandmother    Cervical cancer Maternal Aunt 64       daughter w/ cervical cancer as well   Melanoma Maternal Uncle        other  uncles with BCC/SCC/Melanoma     Current Outpatient Medications:    acetaminophen (TYLENOL) 500 MG tablet, Take 500 mg by mouth every 6 (six) hours as needed., Disp: , Rfl:    albuterol (VENTOLIN HFA) 108 (90 Base) MCG/ACT inhaler, Inhale 2 puffs into the lungs every 6 (six) hours as needed for wheezing or shortness of breath. , Disp: , Rfl:    Calcium Carbonate-Vitamin D 600-400 MG-UNIT tablet, Take 3 tablets by mouth daily., Disp: , Rfl:    cholecalciferol (VITAMIN D3) 25 MCG (1000 UNIT) tablet, Take 1 tablet (1,000 Units total) by mouth daily., Disp: 90 tablet, Rfl: 0   cyanocobalamin (,VITAMIN B-12,) 1000 MCG/ML injection, Inject 1,000 mcg into the skin every 30 (thirty) days., Disp: , Rfl:    cyclobenzaprine (FLEXERIL) 5 MG tablet, Take 5 mg by mouth 3 (three) times daily as needed for muscle spasms. , Disp: , Rfl:     diphenoxylate-atropine (LOMOTIL) 2.5-0.025 MG tablet, Take 1 tablet by mouth 4 (four) times daily as needed for diarrhea or loose stools., Disp: 60 tablet, Rfl: 0   escitalopram (LEXAPRO) 20 MG tablet, Take 20 mg by mouth daily., Disp: , Rfl:    esomeprazole (NEXIUM) 40 MG capsule, Take 40 mg by mouth daily before breakfast. , Disp: , Rfl:    Eszopiclone 3 MG TABS, Take 3 mg by mouth at bedtime as needed (sleep). , Disp: , Rfl:    folic acid (FOLVITE) 1 MG tablet, Take 1 mg by mouth daily., Disp: , Rfl:    hydroxychloroquine (PLAQUENIL) 200 MG tablet, Take 200 mg by mouth daily., Disp: , Rfl:    ibuprofen (ADVIL) 800 MG tablet, Take 800 mg by mouth every 8 (eight) hours as needed for moderate pain., Disp: , Rfl:    lidocaine-prilocaine (EMLA) cream, Apply to port and cover 1-2 hours prior to appointment, Disp: 30 g, Rfl: 3   loperamide (IMODIUM) 2 MG capsule, Take 2 tablets with onset of diarrhea, then take 1 tablet every 2 hours until diarrhea stops. Maximum 8 tablets in 24hours, Disp: 30 capsule, Rfl: 3   loratadine (CLARITIN) 10 MG tablet, Take 10 mg by mouth daily. , Disp: , Rfl:    LORazepam (ATIVAN) 1 MG tablet, Take 1 mg by mouth 3 (three) times daily., Disp: , Rfl:    Magnesium 400 MG TABS, Take 400 mg by mouth 2 (two) times daily., Disp: , Rfl:    meloxicam (MOBIC) 7.5 MG tablet, Take 7.5 mg by mouth 2 (two) times daily as needed (pain/inflammation.). , Disp: , Rfl:    methotrexate (RHEUMATREX) 2.5 MG tablet, Take 25 mg by mouth every Sunday. 10 tablets once a week, Disp: , Rfl:    metoprolol succinate (TOPROL-XL) 25 MG 24 hr tablet, Take 37.5 mg by mouth every morning. , Disp: , Rfl:    mupirocin ointment (BACTROBAN) 2 %, Place 1 application into the nose 2 (two) times daily. Use in each nostril twice daily for five (5) days., Disp: 22 g, Rfl: 5   nortriptyline (PAMELOR) 10 MG capsule, Take 30 mg by mouth at bedtime. , Disp: , Rfl:    oxyCODONE (OXY IR/ROXICODONE) 5 MG immediate  release tablet, Take 1 tablet (5 mg total) by mouth every 6 (six) hours as needed for moderate pain or severe pain., Disp: 90 tablet, Rfl: 0   phentermine (ADIPEX-P) 37.5 MG tablet, Take 37.5 mg by mouth daily before breakfast. , Disp: , Rfl:    pregabalin (LYRICA) 150 MG capsule, Take 150 mg  by mouth 2 (two) times daily., Disp: , Rfl:    promethazine (PHENERGAN) 25 MG tablet, Take 1 tablet (25 mg total) by mouth every 8 (eight) hours as needed for nausea or vomiting., Disp: 90 tablet, Rfl: 0   pyridOXINE (VITAMIN B-6) 100 MG tablet, Take 100 mg by mouth daily., Disp: , Rfl:    topiramate (TOPAMAX) 50 MG tablet, Take 50 mg by mouth 2 (two) times daily. , Disp: , Rfl:    vitamin C (ASCORBIC ACID) 500 MG tablet, Take 500 mg by mouth daily., Disp: , Rfl:  No current facility-administered medications for this visit.  Facility-Administered Medications Ordered in Other Visits:    heparin lock flush 100 unit/mL, 500 Units, Intravenous, Once, Earlie Server, MD   sodium chloride flush (NS) 0.9 % injection 10 mL, 10 mL, Intravenous, PRN, Earlie Server, MD, 10 mL at 11/19/18 1249   sodium chloride flush (NS) 0.9 % injection 10 mL, 10 mL, Intravenous, Once, Earlie Server, MD   Physical exam:  Vitals:   07/30/21 0849  BP: 119/74  Pulse: (!) 101  Temp: 97.7 F (36.5 C)  TempSrc: Tympanic  Weight: 180 lb (81.6 kg)  ECOG 1 Physical Exam Constitutional:      General: She is not in acute distress.    Appearance: She is not diaphoretic.  HENT:     Head: Normocephalic and atraumatic.     Nose: Nose normal.     Mouth/Throat:     Pharynx: No oropharyngeal exudate.  Eyes:     General: No scleral icterus.    Pupils: Pupils are equal, round, and reactive to light.     Comments: Right  subconjunctival hemorrhage resolved.  Neck:     Vascular: No JVD.  Cardiovascular:     Rate and Rhythm: Normal rate and regular rhythm.     Heart sounds: Normal heart sounds. No murmur heard. Pulmonary:     Effort: Pulmonary effort  is normal. No respiratory distress.     Breath sounds: Normal breath sounds. No wheezing or rales.  Chest:     Chest wall: No tenderness.  Abdominal:     General: Bowel sounds are normal. There is no distension.     Palpations: Abdomen is soft. There is no mass.     Tenderness: There is no abdominal tenderness. There is no rebound.  Musculoskeletal:        General: Normal range of motion.     Cervical back: Normal range of motion and neck supple.  Lymphadenopathy:     Cervical: No cervical adenopathy.  Skin:    General: Skin is warm and dry.     Findings: No erythema or rash.  Neurological:     Mental Status: She is alert and oriented to person, place, and time.     Cranial Nerves: No cranial nerve deficit.     Motor: No abnormal muscle tone.     Coordination: Coordination normal.  Psychiatric:        Mood and Affect: Affect normal.       Labs   CMP Latest Ref Rng & Units 07/15/2021  Glucose 70 - 99 mg/dL 103(H)  BUN 6 - 20 mg/dL 15  Creatinine 0.44 - 1.00 mg/dL 0.95  Sodium 135 - 145 mmol/L 136  Potassium 3.5 - 5.1 mmol/L 3.3(L)  Chloride 98 - 111 mmol/L 104  CO2 22 - 32 mmol/L 24  Calcium 8.9 - 10.3 mg/dL 9.5  Total Protein 6.5 - 8.1 g/dL 7.7  Total Bilirubin 0.3 -  1.2 mg/dL 0.5  Alkaline Phos 38 - 126 U/L 77  AST 15 - 41 U/L 30  ALT 0 - 44 U/L 24   CBC Latest Ref Rng & Units 07/15/2021  WBC 4.0 - 10.5 K/uL 6.8  Hemoglobin 12.0 - 15.0 g/dL 11.5(L)  Hematocrit 36.0 - 46.0 % 34.3(L)  Platelets 150 - 400 K/uL 280   RADIOGRAPHIC STUDIES: I have personally reviewed the radiological images as listed and agreed with the findings in the report. MR Thoracic Spine W Wo Contrast  Result Date: 07/20/2021 CLINICAL DATA:  Metastatic breast cancer. Back pain. Bone metastasis. EXAM: MRI THORACIC WITHOUT AND WITH CONTRAST TECHNIQUE: Multiplanar and multiecho pulse sequences of the thoracic spine were obtained without and with intravenous contrast. CONTRAST:  7.67m GADAVIST  GADOBUTROL 1 MMOL/ML IV SOLN COMPARISON:  MRI of the thoracic spine July 07, 2020. FINDINGS: Alignment:  Physiologic. Vertebrae: Interval resolution of the increased T2 signal and contrast enhancement in the posterior elements of T7. No flow new focus of abnormal contrast enhancement identified. No fracture, evidence of discitis, or aggressive bone lesion. Cord:  Normal signal and morphology. Paraspinal and other soft tissues: 8 mm right thyroid lobe nodule. Paraspinal soft tissues are unremarkable. Disc levels: Again seen is ossification of the ligamentum flavum at C6-7. No significant disc herniation, spinal canal or neural foraminal stenosis at any level. IMPRESSION: 1. Interval resolution of the increased T2 signal contrast enhancement in the posterior elements of T7. No new lesion identified. 2. No spinal canal or neural foraminal stenosis. Electronically Signed   By: KPedro EarlsM.D.   On: 07/20/2021 09:40   ECHOCARDIOGRAM LIMITED  Result Date: 05/05/2021    ECHOCARDIOGRAM LIMITED REPORT   Patient Name:   DJUANELLE TRUEHEARTDate of Exam: 05/05/2021 Medical Rec #:  [id number]     Height:       67.0 in Accession #:    [id number]    Weight:       192.1 lb Date of Birth:  211/01/1981     BSA:          1.988 m Patient Age:    89years       BP:           116/72 mmHg Patient Gender: F              HR:           102 bpm. Exam Location:  [facility] Procedure: Limited Echo, Cardiac Doppler, Color Doppler and Strain Analysis Indications:     R00.0 Tachycardia-unspecified.  History:         Patient has prior history of Echocardiogram examinations, most                  recent 01/29/2021. Cardiomyopathy, COPD; Signs/Symptoms:Murmur.                  Personal history of chemotherapy.  Sonographer:     JSherrie SportRDCS (AE) Referring Phys:  [id number] Rosiland Sen Diagnosing Phys: BKate SableMD  Sonographer Comments: Global longitudinal strain was attempted. IMPRESSIONS  1. Left ventricular ejection fraction, by  estimation, is 55 to 60%. The left ventricle has normal function. The left ventricle has no regional wall motion abnormalities.  2. Right ventricular systolic function is normal. The right ventricular size is normal.  3. The mitral valve is normal in structure. No evidence of mitral valve regurgitation.  4. The aortic valve is tricuspid. Aortic valve regurgitation is  not visualized. FINDINGS  Left Ventricle: Left ventricular ejection fraction, by estimation, is 55 to 60%. The left ventricle has normal function. The left ventricle has no regional wall motion abnormalities. Global longitudinal strain performed but not reported based on interpreter judgement due to suboptimal tracking. 3D left ventricular ejection fraction analysis performed but not reported based on interpreter judgement due to suboptimal quality. The left ventricular internal cavity size was normal in size. There is no left ventricular hypertrophy. Right Ventricle: The right ventricular size is normal. No increase in right ventricular wall thickness. Right ventricular systolic function is normal. Left Atrium: Left atrial size was normal in size. Right Atrium: Right atrial size was normal in size. Mitral Valve: The mitral valve is normal in structure. Tricuspid Valve: The tricuspid valve is normal in structure. Tricuspid valve regurgitation is trivial. Aortic Valve: The aortic valve is tricuspid. Aortic valve regurgitation is not visualized. Aorta: The aortic root is normal in size and structure. IAS/Shunts: No atrial level shunt detected by color flow Doppler. LEFT VENTRICLE PLAX 2D LVIDd:         4.23 cm LVIDs:         2.98 cm LV PW:         1.09 cm LV IVS:        0.87 cm LVOT diam:     2.00 cm  3D Volume EF: LV SV:         45       3D EF:        53 % LV SV Index:   23       LV EDV:       122 ml LVOT Area:     3.14 cm LV ESV:       57 ml                         LV SV:        65 ml LEFT ATRIUM             Index       RIGHT ATRIUM           Index LA  diam:        3.20 cm 1.61 cm/m  RA Area:     12.40 cm LA Vol (A2C):   35.7 ml 17.96 ml/m RA Volume:   26.50 ml  13.33 ml/m LA Vol (A4C):   30.2 ml 15.19 ml/m LA Biplane Vol: 33.2 ml 16.70 ml/m  AORTIC VALVE             PULMONIC VALVE LVOT Vmax:   73.80 cm/s  PV Vmax:        0.69 m/s LVOT Vmean:  53.300 cm/s PV Peak grad:   1.9 mmHg LVOT VTI:    0.144 m     RVOT Peak grad: 2 mmHg  AORTA Ao Root diam: 2.80 cm  SHUNTS Systemic VTI:  0.14 m Systemic Diam: 2.00 cm Kate Sable MD Electronically signed by Kate Sable MD Signature Date/Time: 05/05/2021/1:18:12 PM    Final          Assessment and plan-  Cancer Staging Breast cancer of upper-outer quadrant of right female breast [place]) Staging form: Breast, AJCC 8th Edition - Clinical: G2, ER+, PR+, HER2- - Signed by Earlie Server, MD on 04/05/2018 Histologic grading system: 3 grade system - Pathologic stage from 04/04/2018: No Stage Recommended (ypT3, pN2, cM0, G3, ER+, PR-, HER2-) - Signed by Earlie Server, MD  on 04/04/2018 Stage prefix: Post-therapy Neoadjuvant therapy: Yes Response to neoadjuvant therapy: No response Method of lymph node assessment: Axillary lymph node dissection Multigene prognostic tests performed: None Histologic grading system: 3 grade system Laterality: Right - Pathologic: Stage IV (rpTX, pNX, cM1, ER+, PR-, HER2+) - Signed by Earlie Server, MD on 12/10/2020 Stage prefix: Recurrence    1. Encounter for monoclonal antibody treatment for malignancy   2. Metastatic breast cancer ([place])   3. HER2-positive carcinoma of breast ([place])   4. Bone metastasis ([place])   5. Thyroid nodule   6. Weight loss    Recurrent breast cancer with isolated bone metastasis, stage IV #Initially stage IIIA right breast cancer, ER positive, HER-2 negative status post bilateral oophorectomy, Right mastectomy and right axillary lymph node dissection, status post implant, and implant removal.  Status post left mastectomy and axillary SLNB.-developed  stage IV disease with biopsy-proven thoracic spine bone metastasis. Estrogen positive, HER-2 positive breast cancer.- s/p Palliative radiation to T7  Labs are reviewed and discussed with patient. Proceed with maintenance trastuzumab and pertuzumab Ful[place]rant monthly  #Anemia,stable.   Likely secondary to methotrexate.   #Tachycardia, chronic intermittent issue for her.   525/22 2D echocardiogram LVEF 55%-60%. She is on metoprolol.   # Hypokalemia, does not tolerate oral potassium.   #Treatment induced diarrhea,Continue Lomotil as needed.  Symptoms are stable. #Bone metastasis, status post radiation. Continue Zometa monthly.  #Back pain, symptoms are worse. Mainly at the lower thoracic spine. continue oxycodone 5 mg daily 6 hours as needed. Refilled prescription. MRI was reviewed and discussed with patient. T7 lesion has improved.   #Chemotherapy-induced neuropathy, on Lyrica and nortriptyline, continue.  #Depression.  Continue Lexapro 20 mg daily. #Rheumatoid arthritis,   Follow up with  rheumatology.  Patient is on methotrexate, recently added plaquenil.  # thyroid nodule - obtain thyroid US.  # Weight loss, unknown etiology. Drug side effect from plaquenil?   Follow-up in 3 weeks for the next cycle of trastuzumab and Pertuzumab treatments.  Patient knows to call clinic if she experiences concerning side effects  Earlie Server, MD, PhD Hematology Oncology [place] at [facility] 07/30/21

## 2021-07-30 NOTE — Patient Instructions (Addendum)
[facility] ONCOLOGY  Discharge Instructions: Thank you for choosing [facility] to provide your oncology and hematology care.  If you have a lab appointment with the [facility], please go directly to the [facility] and check in at the registration area.  Wear comfortable clothing and clothing appropriate for easy access to any Portacath or PICC line.   We strive to give you quality time with your provider. You may need to reschedule your appointment if you arrive late (15 or more minutes).  Arriving late affects you and other patients whose appointments are after yours.  Also, if you miss three or more appointments without notifying the office, you may be dismissed from the clinic at the provider's discretion.      For prescription refill requests, have your pharmacy contact our office and allow 72 hours for refills to be completed.    Today you received the following chemotherapy and/or immunotherapy agents Perjeta, kanjiniti    To help prevent nausea and vomiting after your treatment, we encourage you to take your nausea medication as directed.  BELOW ARE SYMPTOMS THAT SHOULD BE REPORTED IMMEDIATELY: *FEVER GREATER THAN 100.4 F (38 C) OR HIGHER *CHILLS OR SWEATING *NAUSEA AND VOMITING THAT IS NOT CONTROLLED WITH YOUR NAUSEA MEDICATION *UNUSUAL SHORTNESS OF BREATH *UNUSUAL BRUISING OR BLEEDING *URINARY PROBLEMS (pain or burning when urinating, or frequent urination) *BOWEL PROBLEMS (unusual diarrhea, constipation, pain near the anus) TENDERNESS IN MOUTH AND THROAT WITH OR WITHOUT PRESENCE OF ULCERS (sore throat, sores in mouth, or a toothache) UNUSUAL RASH, SWELLING OR PAIN  UNUSUAL VAGINAL DISCHARGE OR ITCHING   Items with * indicate a potential emergency and should be followed up as soon as possible or go to the Emergency Department if any problems should occur.  Please show the CHEMOTHERAPY ALERT CARD or IMMUNOTHERAPY ALERT CARD at  check-in to the Emergency Department and triage nurse.  Should you have questions after your visit or need to cancel or reschedule your appointment, please contact [facility]  [phone] and follow the prompts.  Office hours are 8:00 a.m. to 4:30 p.m. Monday - Friday. Please note that voicemails left after 4:00 p.m. may not be returned until the following business day.  We are closed weekends and major holidays. You have access to a nurse at all times for urgent questions. Please call the main number to the clinic [phone] and follow the prompts.  For any non-urgent questions, you may also contact your provider using MyChart. We now offer e-Visits for anyone 22 and older to request care online for non-urgent symptoms. For details visit [email or web address].   Also download the MyChart app! Go to the app store, search "MyChart", open the app, select [facility], and log in with your MyChart username and password.  Due to Covid, a mask is required upon entering the hospital/clinic. If you do not have a mask, one will be given to you upon arrival. For doctor visits, patients may have 1 support person aged 52 or older with them. For treatment visits, patients cannot have anyone with them due to current Covid guidelines and our immunocompromised population.

## 2021-07-31 ENCOUNTER — Encounter: Payer: Self-pay | Admitting: Oncology

## 2021-08-09 ENCOUNTER — Other Ambulatory Visit: Payer: Self-pay | Admitting: Oncology

## 2021-08-09 ENCOUNTER — Other Ambulatory Visit: Payer: Self-pay

## 2021-08-09 ENCOUNTER — Ambulatory Visit
Admission: RE | Admit: 2021-08-09 | Discharge: 2021-08-09 | Disposition: A | Discharge status: Home / Self Care | Payer: Medicare HMO | Source: Ambulatory Visit | Attending: Oncology | Admitting: Oncology

## 2021-08-09 DIAGNOSIS — E041 Nontoxic single thyroid nodule: Secondary | ICD-10-CM

## 2021-08-09 IMAGING — US US THYROID
1 series · 13 of 25 positions shown · non-contrast
Comparison: [DATE]

CLINICAL DATA: 8 mm thyroid nodule on the right by thoracic spine
MRI

EXAM:
THYROID ULTRASOUND
TECHNIQUE: Ultrasound examination of the thyroid gland and adjacent soft
tissues was performed.

[Series 1: us thyroid · 0.07mm/px · 13 of 80 slices shown]
[im 1/80]
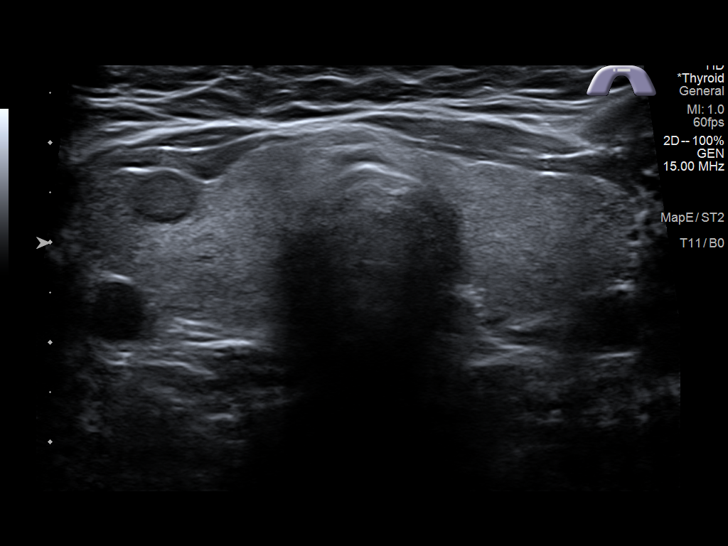
[im 7/80]
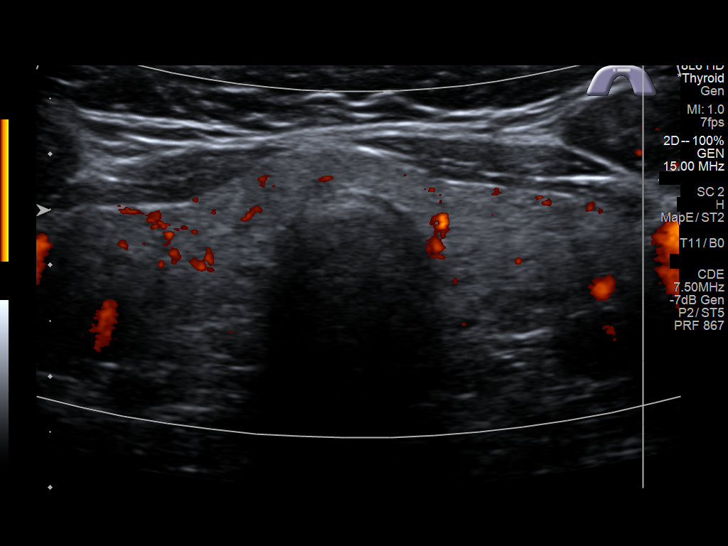
[im 14/80]
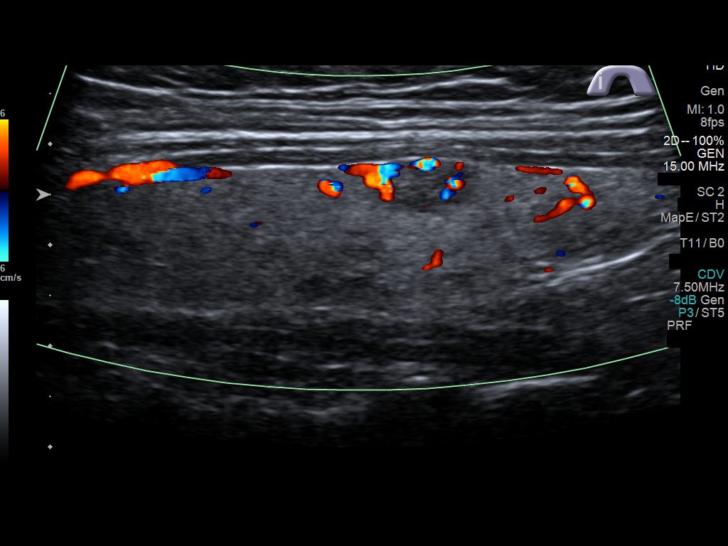
[im 20/80]
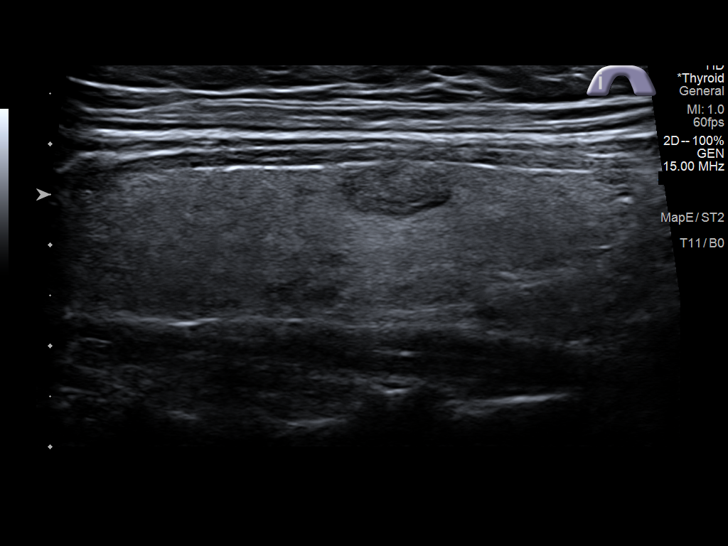
[im 27/80]
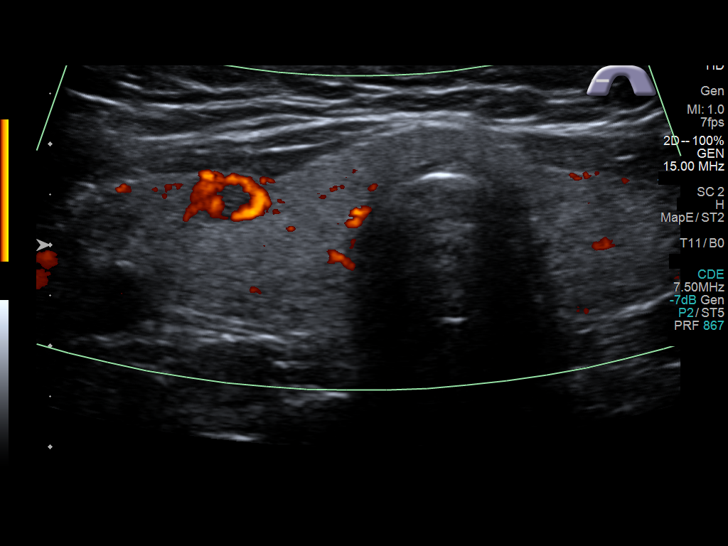
[im 33/80]
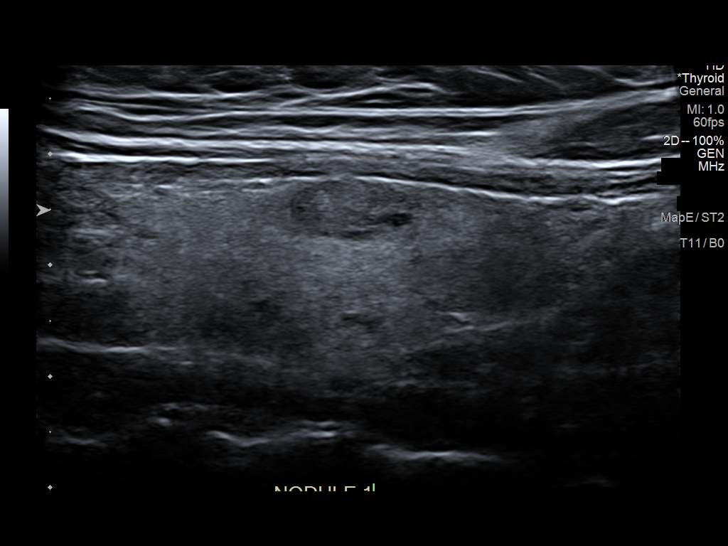
[im 40/80]
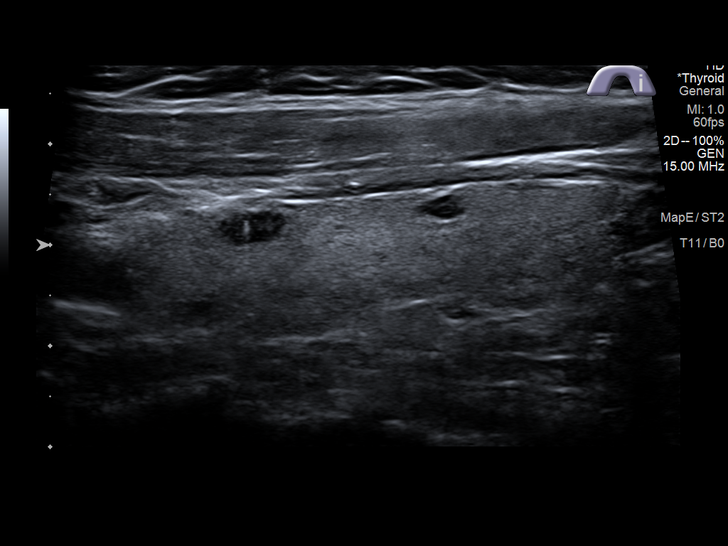
[im 47/80]
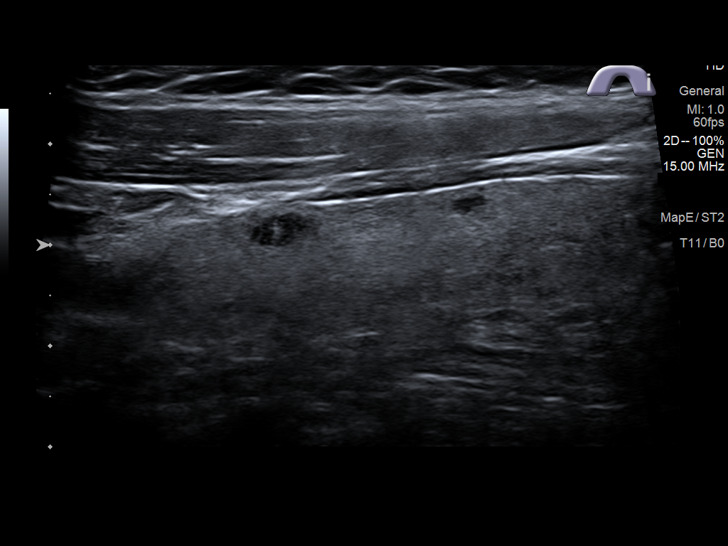
[im 53/80]
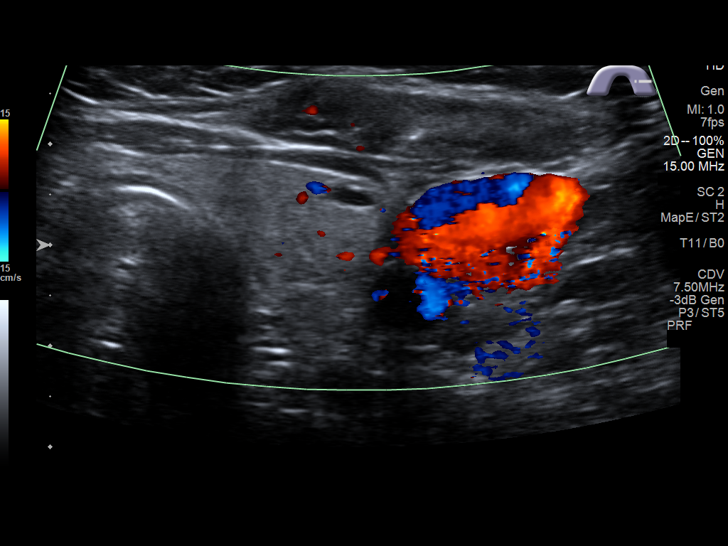
[im 60/80]
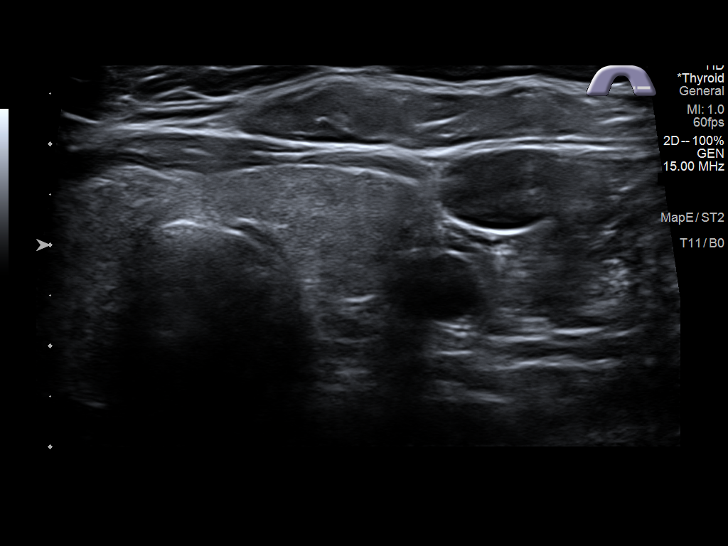
[im 66/80]
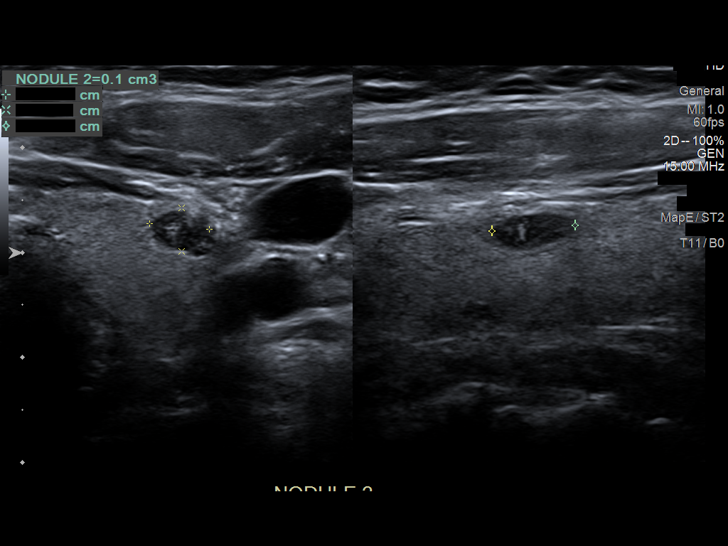
[im 73/80]
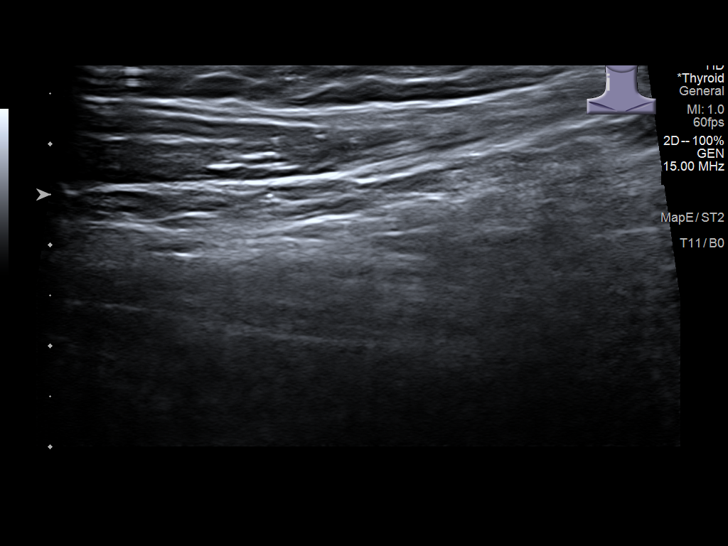
[im 80/80]
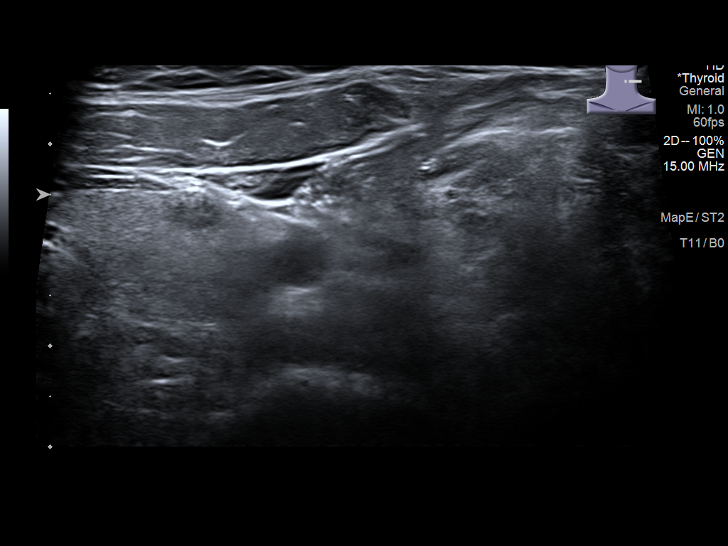

[13 of 25 positions shown; findings below may reference images not displayed]

FINDINGS: Parenchymal Echotexture: Normal

Isthmus: 5 mm

Right lobe: 5.8 x 1.3 x 2.7 cm

Left lobe: 5.3 x 1.3 x 1.6 cm

_________________________________________________________

Estimated total number of nodules >/= 1 cm: 1

Number of spongiform nodules >/=  2 cm not described below (TR1): 0

Number of mixed cystic and solid nodules >/= 1.5 cm not described
below (TR2): 0

_________________________________________________________

Nodule # 1:

Location: Right; Mid

Maximum size: 1.1 cm; Other 2 dimensions: 0.8 x 0.5 cm

Composition: solid/almost completely solid (2)

Echogenicity: hypoechoic (2)

Shape: not taller-than-wide (0)

Margins: smooth (0)

Echogenic foci: none (0)

ACR TI-RADS total points: 4.

ACR TI-RADS risk category: TR4 (4-6 points).

ACR TI-RADS recommendations:

*Given size (>/= 1 - 1.4 cm) and appearance, a follow-up ultrasound
in 1 year should be considered based on TI-RADS criteria.

_________________________________________________________

Left thyroid lobe demonstrates subcentimeter cystic nodules all
measuring 8 mm or less in size. These would not meet criteria for
any biopsy or follow-up and are not fully described by TI rads
criteria.

No hypervascularity.  No regional adenopathy.
IMPRESSION: 1.1 cm right mid thyroid TR 4 nodule meets criteria follow-up in 1
year. This correlates with the MR finding.

The above is in keeping with the ACR TI-RADS recommendations - [HOSPITAL] [PP];[DATE].

## 2021-08-12 ENCOUNTER — Inpatient Hospital Stay: Payer: Medicare HMO

## 2021-08-12 ENCOUNTER — Other Ambulatory Visit: Payer: Self-pay

## 2021-08-12 ENCOUNTER — Inpatient Hospital Stay: Payer: Medicare HMO | Attending: Oncology

## 2021-08-12 VITALS — BP 109/75 | HR 98 | Temp 97.0°F | Resp 18

## 2021-08-12 DIAGNOSIS — Z95828 Presence of other vascular implants and grafts: Secondary | ICD-10-CM

## 2021-08-12 DIAGNOSIS — C50411 Malignant neoplasm of upper-outer quadrant of right female breast: Secondary | ICD-10-CM | POA: Diagnosis present

## 2021-08-12 DIAGNOSIS — Z79899 Other long term (current) drug therapy: Secondary | ICD-10-CM | POA: Insufficient documentation

## 2021-08-12 DIAGNOSIS — Z5112 Encounter for antineoplastic immunotherapy: Secondary | ICD-10-CM | POA: Insufficient documentation

## 2021-08-12 DIAGNOSIS — C7951 Secondary malignant neoplasm of bone: Secondary | ICD-10-CM | POA: Insufficient documentation

## 2021-08-12 DIAGNOSIS — Z170 Estrogen receptor positive status [ER+]: Secondary | ICD-10-CM

## 2021-08-12 DIAGNOSIS — C50919 Malignant neoplasm of unspecified site of unspecified female breast: Secondary | ICD-10-CM

## 2021-08-12 LAB — CBC WITH DIFFERENTIAL/PLATELET
Abs Immature Granulocytes: 0.02 K/uL (ref 0.00–0.07)
Basophils Absolute: 0 K/uL (ref 0.0–0.1)
Basophils Relative: 0 %
Eosinophils Absolute: 0.1 K/uL (ref 0.0–0.5)
Eosinophils Relative: 1 %
HCT: 29.8 % — ABNORMAL LOW (ref 36.0–46.0)
Hemoglobin: 10.2 g/dL — ABNORMAL LOW (ref 12.0–15.0)
Immature Granulocytes: 0 %
Lymphocytes Relative: 26 %
Lymphs Abs: 2.2 K/uL (ref 0.7–4.0)
MCH: 30.4 pg (ref 26.0–34.0)
MCHC: 34.2 g/dL (ref 30.0–36.0)
MCV: 89 fL (ref 80.0–100.0)
Monocytes Absolute: 0.7 K/uL (ref 0.1–1.0)
Monocytes Relative: 8 %
Neutro Abs: 5.7 K/uL (ref 1.7–7.7)
Neutrophils Relative %: 65 %
Platelets: 270 K/uL (ref 150–400)
RBC: 3.35 MIL/uL — ABNORMAL LOW (ref 3.87–5.11)
RDW: 13.4 % (ref 11.5–15.5)
WBC: 8.7 K/uL (ref 4.0–10.5)
nRBC: 0 % (ref 0.0–0.2)

## 2021-08-12 LAB — COMPREHENSIVE METABOLIC PANEL WITH GFR
ALT: 21 U/L (ref 0–44)
AST: 28 U/L (ref 15–41)
Albumin: 4.4 g/dL (ref 3.5–5.0)
Alkaline Phosphatase: 63 U/L (ref 38–126)
Anion gap: 7 (ref 5–15)
BUN: 19 mg/dL (ref 6–20)
CO2: 22 mmol/L (ref 22–32)
Calcium: 8.6 mg/dL — ABNORMAL LOW (ref 8.9–10.3)
Chloride: 105 mmol/L (ref 98–111)
Creatinine, Ser: 0.86 mg/dL (ref 0.44–1.00)
GFR, Estimated: >60 mL/min
Glucose, Bld: 110 mg/dL — ABNORMAL HIGH (ref 70–99)
Potassium: 3.3 mmol/L — ABNORMAL LOW (ref 3.5–5.1)
Sodium: 134 mmol/L — ABNORMAL LOW (ref 135–145)
Total Bilirubin: 0.5 mg/dL (ref 0.3–1.2)
Total Protein: 7.2 g/dL (ref 6.5–8.1)

## 2021-08-12 LAB — MAGNESIUM: Magnesium: 2 mg/dL (ref 1.7–2.4)

## 2021-08-12 MED ORDER — HEPARIN SOD (PORK) LOCK FLUSH 100 UNIT/ML IV SOLN
500.0000 [IU] | Freq: Once | INTRAVENOUS | Status: AC
  Administered 2021-08-12: 500 [IU] via INTRAVENOUS
  Filled 2021-08-12: qty 5

## 2021-08-12 MED ORDER — FULVESTRANT 250 MG/5ML IM SOLN
500.0000 mg | INTRAMUSCULAR | Status: DC
  Administered 2021-08-12: 500 mg via INTRAMUSCULAR
  Filled 2021-08-12: qty 10

## 2021-08-12 MED ORDER — HEPARIN SOD (PORK) LOCK FLUSH 100 UNIT/ML IV SOLN
INTRAVENOUS | Status: AC
  Filled 2021-08-12: qty 5

## 2021-08-12 MED ORDER — ZOLEDRONIC ACID 4 MG/100ML IV SOLN
4.0000 mg | Freq: Once | INTRAVENOUS | Status: AC
  Administered 2021-08-12: 4 mg via INTRAVENOUS
  Filled 2021-08-12: qty 100

## 2021-08-12 MED ORDER — SODIUM CHLORIDE 0.9 % IV SOLN
Freq: Once | INTRAVENOUS | Status: AC
  Filled 2021-08-12: qty 250

## 2021-08-12 NOTE — Progress Notes (Signed)
Labs reviewed with MD,Per MD to proceed with Zometa. Treatment team updated.   Ciella Obi [employer]

## 2021-08-12 NOTE — Patient Instructions (Signed)
[facility] ONCOLOGY  Discharge Instructions: Thank you for choosing [facility] to provide your oncology and hematology care.  If you have a lab appointment with the [facility], please go directly to the [facility] and check in at the registration area.  Wear comfortable clothing and clothing appropriate for easy access to any Portacath or PICC line.   We strive to give you quality time with your provider. You may need to reschedule your appointment if you arrive late (15 or more minutes).  Arriving late affects you and other patients whose appointments are after yours.  Also, if you miss three or more appointments without notifying the office, you may be dismissed from the clinic at the provider's discretion.      For prescription refill requests, have your pharmacy contact our office and allow 72 hours for refills to be completed.    Today you received the following chemotherapy and/or immunotherapy agents Zometa  and Fulvestrant   To help prevent nausea and vomiting after your treatment, we encourage you to take your nausea medication as directed.  BELOW ARE SYMPTOMS THAT SHOULD BE REPORTED IMMEDIATELY: *FEVER GREATER THAN 100.4 F (38 C) OR HIGHER *CHILLS OR SWEATING *NAUSEA AND VOMITING THAT IS NOT CONTROLLED WITH YOUR NAUSEA MEDICATION *UNUSUAL SHORTNESS OF BREATH *UNUSUAL BRUISING OR BLEEDING *URINARY PROBLEMS (pain or burning when urinating, or frequent urination) *BOWEL PROBLEMS (unusual diarrhea, constipation, pain near the anus) TENDERNESS IN MOUTH AND THROAT WITH OR WITHOUT PRESENCE OF ULCERS (sore throat, sores in mouth, or a toothache) UNUSUAL RASH, SWELLING OR PAIN  UNUSUAL VAGINAL DISCHARGE OR ITCHING   Items with * indicate a potential emergency and should be followed up as soon as possible or go to the Emergency Department if any problems should occur.  Please show the CHEMOTHERAPY ALERT CARD or IMMUNOTHERAPY ALERT CARD at  check-in to the Emergency Department and triage nurse.  Should you have questions after your visit or need to cancel or reschedule your appointment, please contact [facility]  [phone] and follow the prompts.  Office hours are 8:00 a.m. to 4:30 p.m. Monday - Friday. Please note that voicemails left after 4:00 p.m. may not be returned until the following business day.  We are closed weekends and major holidays. You have access to a nurse at all times for urgent questions. Please call the main number to the clinic [phone] and follow the prompts.  For any non-urgent questions, you may also contact your provider using MyChart. We now offer e-Visits for anyone 38 and older to request care online for non-urgent symptoms. For details visit [email or web address].   Also download the MyChart app! Go to the app store, search "MyChart", open the app, select [facility], and log in with your MyChart username and password.  Due to Covid, a mask is required upon entering the hospital/clinic. If you do not have a mask, one will be given to you upon arrival. For doctor visits, patients may have 1 support person aged 71 or older with them. For treatment visits, patients cannot have anyone with them due to current Covid guidelines and our immunocompromised population.

## 2021-08-17 ENCOUNTER — Encounter: Payer: Self-pay | Admitting: Oncology

## 2021-08-17 NOTE — Telephone Encounter (Signed)
Please advise 

## 2021-08-20 ENCOUNTER — Inpatient Hospital Stay: Payer: Medicare HMO

## 2021-08-20 ENCOUNTER — Inpatient Hospital Stay (HOSPITAL_BASED_OUTPATIENT_CLINIC_OR_DEPARTMENT_OTHER): Payer: Medicare HMO | Admitting: Oncology

## 2021-08-20 ENCOUNTER — Other Ambulatory Visit: Payer: Self-pay

## 2021-08-20 ENCOUNTER — Encounter: Payer: Self-pay | Admitting: Oncology

## 2021-08-20 VITALS — BP 116/65 | HR 88 | Temp 96.8°F | Resp 18 | Wt 183.3 lb

## 2021-08-20 DIAGNOSIS — M546 Pain in thoracic spine: Secondary | ICD-10-CM

## 2021-08-20 DIAGNOSIS — G620 Drug-induced polyneuropathy: Secondary | ICD-10-CM

## 2021-08-20 DIAGNOSIS — E041 Nontoxic single thyroid nodule: Secondary | ICD-10-CM

## 2021-08-20 DIAGNOSIS — Z170 Estrogen receptor positive status [ER+]: Secondary | ICD-10-CM

## 2021-08-20 DIAGNOSIS — G8929 Other chronic pain: Secondary | ICD-10-CM

## 2021-08-20 DIAGNOSIS — C50919 Malignant neoplasm of unspecified site of unspecified female breast: Secondary | ICD-10-CM | POA: Diagnosis not present

## 2021-08-20 DIAGNOSIS — Z5112 Encounter for antineoplastic immunotherapy: Secondary | ICD-10-CM

## 2021-08-20 DIAGNOSIS — E876 Hypokalemia: Secondary | ICD-10-CM | POA: Diagnosis not present

## 2021-08-20 DIAGNOSIS — Z95828 Presence of other vascular implants and grafts: Secondary | ICD-10-CM

## 2021-08-20 DIAGNOSIS — T451X5A Adverse effect of antineoplastic and immunosuppressive drugs, initial encounter: Secondary | ICD-10-CM

## 2021-08-20 DIAGNOSIS — C50811 Malignant neoplasm of overlapping sites of right female breast: Secondary | ICD-10-CM

## 2021-08-20 DIAGNOSIS — C50411 Malignant neoplasm of upper-outer quadrant of right female breast: Secondary | ICD-10-CM

## 2021-08-20 LAB — COMPREHENSIVE METABOLIC PANEL
ALT: 20 U/L (ref 0–44)
AST: 29 U/L (ref 15–41)
Albumin: 4 g/dL (ref 3.5–5.0)
Alkaline Phosphatase: 68 U/L (ref 38–126)
Anion gap: 7 (ref 5–15)
BUN: 14 mg/dL (ref 6–20)
CO2: 24 mmol/L (ref 22–32)
Calcium: 9 mg/dL (ref 8.9–10.3)
Chloride: 108 mmol/L (ref 98–111)
Creatinine, Ser: 0.74 mg/dL (ref 0.44–1.00)
GFR, Estimated: >60 mL/min (ref >60)
Glucose, Bld: 106 mg/dL — ABNORMAL HIGH (ref 70–99)
Potassium: 3.3 mmol/L — ABNORMAL LOW (ref 3.5–5.1)
Sodium: 139 mmol/L (ref 135–145)
Total Bilirubin: 0.5 mg/dL (ref 0.3–1.2)
Total Protein: 7.1 g/dL (ref 6.5–8.1)

## 2021-08-20 LAB — CBC WITH DIFFERENTIAL/PLATELET
Abs Immature Granulocytes: 0.02 10*3/uL (ref 0.00–0.07)
Basophils Absolute: 0 10*3/uL (ref 0.0–0.1)
Basophils Relative: 0 %
Eosinophils Absolute: 0.1 10*3/uL (ref 0.0–0.5)
Eosinophils Relative: 2 %
HCT: 30.3 % — ABNORMAL LOW (ref 36.0–46.0)
Hemoglobin: 10.2 g/dL — ABNORMAL LOW (ref 12.0–15.0)
Immature Granulocytes: 0 %
Lymphocytes Relative: 28 %
Lymphs Abs: 1.9 10*3/uL (ref 0.7–4.0)
MCH: 30.2 pg (ref 26.0–34.0)
MCHC: 33.7 g/dL (ref 30.0–36.0)
MCV: 89.6 fL (ref 80.0–100.0)
Monocytes Absolute: 0.6 10*3/uL (ref 0.1–1.0)
Monocytes Relative: 9 %
Neutro Abs: 4.1 10*3/uL (ref 1.7–7.7)
Neutrophils Relative %: 61 %
Platelets: 280 10*3/uL (ref 150–400)
RBC: 3.38 MIL/uL — ABNORMAL LOW (ref 3.87–5.11)
RDW: 13.6 % (ref 11.5–15.5)
WBC: 6.8 10*3/uL (ref 4.0–10.5)
nRBC: 0 % (ref 0.0–0.2)

## 2021-08-20 LAB — MAGNESIUM: Magnesium: 2 mg/dL (ref 1.7–2.4)

## 2021-08-20 MED ORDER — SODIUM CHLORIDE 0.9% FLUSH
10.0000 mL | Freq: Once | INTRAVENOUS | Status: AC
  Administered 2021-08-20: 10 mL via INTRAVENOUS
  Filled 2021-08-20: qty 10

## 2021-08-20 MED ORDER — HEPARIN SOD (PORK) LOCK FLUSH 100 UNIT/ML IV SOLN
500.0000 [IU] | Freq: Once | INTRAVENOUS | Status: AC | PRN
  Administered 2021-08-20: 500 [IU]
  Filled 2021-08-20: qty 5

## 2021-08-20 MED ORDER — HEPARIN SOD (PORK) LOCK FLUSH 100 UNIT/ML IV SOLN
500.0000 [IU] | Freq: Once | INTRAVENOUS | Status: DC
  Filled 2021-08-20: qty 5

## 2021-08-20 MED ORDER — SODIUM CHLORIDE 0.9 % IV SOLN
420.0000 mg | Freq: Once | INTRAVENOUS | Status: AC
  Administered 2021-08-20: 420 mg via INTRAVENOUS
  Filled 2021-08-20: qty 14

## 2021-08-20 MED ORDER — SODIUM CHLORIDE 0.9 % IV SOLN
Freq: Once | INTRAVENOUS | Status: AC
  Filled 2021-08-20: qty 250

## 2021-08-20 MED ORDER — DIPHENHYDRAMINE HCL 25 MG PO CAPS
50.0000 mg | ORAL_CAPSULE | Freq: Once | ORAL | Status: AC
  Administered 2021-08-20: 50 mg via ORAL
  Filled 2021-08-20: qty 2

## 2021-08-20 MED ORDER — TRASTUZUMAB-ANNS CHEMO 150 MG IV SOLR
6.0000 mg/kg | Freq: Once | INTRAVENOUS | Status: AC
  Administered 2021-08-20: 504 mg via INTRAVENOUS
  Filled 2021-08-20: qty 24

## 2021-08-20 MED ORDER — ACETAMINOPHEN 325 MG PO TABS
650.0000 mg | ORAL_TABLET | Freq: Once | ORAL | Status: AC
  Administered 2021-08-20: 650 mg via ORAL
  Filled 2021-08-20: qty 2

## 2021-08-20 NOTE — Progress Notes (Signed)
Last ECHO 05/05/21. Per MD ok to treat. Will order repeat echo and follow up with cardiology.

## 2021-08-20 NOTE — Patient Instructions (Signed)
[facility] ONCOLOGY  Discharge Instructions: Thank you for choosing [facility] to provide your oncology and hematology care.  If you have a lab appointment with the [facility], please go directly to the [facility] and check in at the registration area.  Wear comfortable clothing and clothing appropriate for easy access to any Portacath or PICC line.   We strive to give you quality time with your provider. You may need to reschedule your appointment if you arrive late (15 or more minutes).  Arriving late affects you and other patients whose appointments are after yours.  Also, if you miss three or more appointments without notifying the office, you may be dismissed from the clinic at the provider's discretion.      For prescription refill requests, have your pharmacy contact our office and allow 72 hours for refills to be completed.    Today you received the following chemotherapy and/or immunotherapy agents: Perjeta, Herceptin      To help prevent nausea and vomiting after your treatment, we encourage you to take your nausea medication as directed.  BELOW ARE SYMPTOMS THAT SHOULD BE REPORTED IMMEDIATELY: *FEVER GREATER THAN 100.4 F (38 C) OR HIGHER *CHILLS OR SWEATING *NAUSEA AND VOMITING THAT IS NOT CONTROLLED WITH YOUR NAUSEA MEDICATION *UNUSUAL SHORTNESS OF BREATH *UNUSUAL BRUISING OR BLEEDING *URINARY PROBLEMS (pain or burning when urinating, or frequent urination) *BOWEL PROBLEMS (unusual diarrhea, constipation, pain near the anus) TENDERNESS IN MOUTH AND THROAT WITH OR WITHOUT PRESENCE OF ULCERS (sore throat, sores in mouth, or a toothache) UNUSUAL RASH, SWELLING OR PAIN  UNUSUAL VAGINAL DISCHARGE OR ITCHING   Items with * indicate a potential emergency and should be followed up as soon as possible or go to the Emergency Department if any problems should occur.  Please show the CHEMOTHERAPY ALERT CARD or IMMUNOTHERAPY ALERT CARD at  check-in to the Emergency Department and triage nurse.  Should you have questions after your visit or need to cancel or reschedule your appointment, please contact [facility]  [phone] and follow the prompts.  Office hours are 8:00 a.m. to 4:30 p.m. Monday - Friday. Please note that voicemails left after 4:00 p.m. may not be returned until the following business day.  We are closed weekends and major holidays. You have access to a nurse at all times for urgent questions. Please call the main number to the clinic [phone] and follow the prompts.  For any non-urgent questions, you may also contact your provider using MyChart. We now offer e-Visits for anyone 38 and older to request care online for non-urgent symptoms. For details visit [email or web address].   Also download the MyChart app! Go to the app store, search "MyChart", open the app, select [facility], and log in with your MyChart username and password.  Due to Covid, a mask is required upon entering the hospital/clinic. If you do not have a mask, one will be given to you upon arrival. For doctor visits, patients may have 1 support person aged 22 or older with them. For treatment visits, patients cannot have anyone with them due to current Covid guidelines and our immunocompromised population. Trastuzumab injection for infusion What is this medication? TRASTUZUMAB (tras TOO zoo mab) is a monoclonal antibody. It is used to treat breast cancer and stomach cancer. This medicine may be used for other purposes; ask your health care provider or pharmacist if you have questions. COMMON BRAND NAME(S): Herceptin, Donnald Garre What  should I tell my care team before I take this medication? They need to know if you have any of these conditions: heart disease heart failure lung or breathing disease, like asthma an unusual or allergic reaction to trastuzumab, benzyl  alcohol, or other medications, foods, dyes, or preservatives pregnant or trying to get pregnant breast-feeding How should I use this medication? This drug is given as an infusion into a vein. It is administered in a hospital or clinic by a specially trained health care professional. Talk to your pediatrician regarding the use of this medicine in children. This medicine is not approved for use in children. Overdosage: If you think you have taken too much of this medicine contact a poison control center or emergency room at once. NOTE: This medicine is only for you. Do not share this medicine with others. What if I miss a dose? It is important not to miss a dose. Call your doctor or health care professional if you are unable to keep an appointment. What may interact with this medication? This medicine may interact with the following medications: certain types of chemotherapy, such as daunorubicin, doxorubicin, epirubicin, and idarubicin This list may not describe all possible interactions. Give your health care provider a list of all the medicines, herbs, non-prescription drugs, or dietary supplements you use. Also tell them if you smoke, drink alcohol, or use illegal drugs. Some items may interact with your medicine. What should I watch for while using this medication? Visit your doctor for checks on your progress. Report any side effects. Continue your course of treatment even though you feel ill unless your doctor tells you to stop. Call your doctor or health care professional for advice if you get a fever, chills or sore throat, or other symptoms of a cold or flu. Do not treat yourself. Try to avoid being around people who are sick. You may experience fever, chills and shaking during your first infusion. These effects are usually mild and can be treated with other medicines. Report any side effects during the infusion to your health care professional. Fever and chills usually do not happen with  later infusions. Do not become pregnant while taking this medicine or for 7 months after stopping it. Women should inform their doctor if they wish to become pregnant or think they might be pregnant. Women of child-bearing potential will need to have a negative pregnancy test before starting this medicine. There is a potential for serious side effects to an unborn child. Talk to your health care professional or pharmacist for more information. Do not breast-feed an infant while taking this medicine or for 7 months after stopping it. Women must use effective birth control with this medicine. What side effects may I notice from receiving this medication? Side effects that you should report to your doctor or health care professional as soon as possible: allergic reactions like skin rash, itching or hives, swelling of the face, lips, or tongue chest pain or palpitations cough dizziness feeling faint or lightheaded, falls fever general ill feeling or flu-like symptoms signs of worsening heart failure like breathing problems; swelling in your legs and feet unusually weak or tired Side effects that usually do not require medical attention (report to your doctor or health care professional if they continue or are bothersome): bone pain changes in taste diarrhea joint pain nausea/vomiting weight loss This list may not describe all possible side effects. Call your doctor for medical advice about side effects. You may report side effects to FDA  at 1-800-FDA-1088. Where should I keep my medication? This drug is given in a hospital or clinic and will not be stored at home. NOTE: This sheet is a summary. It may not cover all possible information. If you have questions about this medicine, talk to your doctor, pharmacist, or health care provider.  2022 Elsevier/Gold Standard (2016-11-22 14:37:52) Pertuzumab injection What is this medication? PERTUZUMAB (per TOOZ ue mab) is a monoclonal antibody. It is  used to treat breast cancer. This medicine may be used for other purposes; ask your health care provider or pharmacist if you have questions. COMMON BRAND NAME(S): PERJETA What should I tell my care team before I take this medication? They need to know if you have any of these conditions: heart disease heart failure high blood pressure history of irregular heart beat recent or ongoing radiation therapy an unusual or allergic reaction to pertuzumab, other medicines, foods, dyes, or preservatives pregnant or trying to get pregnant breast-feeding How should I use this medication? This medicine is for infusion into a vein. It is given by a health care professional in a hospital or clinic setting. Talk to your pediatrician regarding the use of this medicine in children. Special care may be needed. Overdosage: If you think you have taken too much of this medicine contact a poison control center or emergency room at once. NOTE: This medicine is only for you. Do not share this medicine with others. What if I miss a dose? It is important not to miss your dose. Call your doctor or health care professional if you are unable to keep an appointment. What may interact with this medication? Interactions are not expected. Give your health care provider a list of all the medicines, herbs, non-prescription drugs, or dietary supplements you use. Also tell them if you smoke, drink alcohol, or use illegal drugs. Some items may interact with your medicine. This list may not describe all possible interactions. Give your health care provider a list of all the medicines, herbs, non-prescription drugs, or dietary supplements you use. Also tell them if you smoke, drink alcohol, or use illegal drugs. Some items may interact with your medicine. What should I watch for while using this medication? Your condition will be monitored carefully while you are receiving this medicine. Report any side effects. Continue your  course of treatment even though you feel ill unless your doctor tells you to stop. Do not become pregnant while taking this medicine or for 7 months after stopping it. Women should inform their doctor if they wish to become pregnant or think they might be pregnant. Women of child-bearing potential will need to have a negative pregnancy test before starting this medicine. There is a potential for serious side effects to an unborn child. Talk to your health care professional or pharmacist for more information. Do not breast-feed an infant while taking this medicine or for 7 months after stopping it. Women must use effective birth control with this medicine. Call your doctor or health care professional for advice if you get a fever, chills or sore throat, or other symptoms of a cold or flu. Do not treat yourself. Try to avoid being around people who are sick. You may experience fever, chills, and headache during the infusion. Report any side effects during the infusion to your health care professional. What side effects may I notice from receiving this medication? Side effects that you should report to your doctor or health care professional as soon as possible: breathing problems chest pain  or palpitations dizziness feeling faint or lightheaded fever or chills skin rash, itching or hives sore throat swelling of the face, lips, or tongue swelling of the legs or ankles unusually weak or tired Side effects that usually do not require medical attention (report to your doctor or health care professional if they continue or are bothersome): diarrhea hair loss nausea, vomiting tiredness This list may not describe all possible side effects. Call your doctor for medical advice about side effects. You may report side effects to FDA at 1-800-FDA-1088. Where should I keep my medication? This drug is given in a hospital or clinic and will not be stored at home. NOTE: This sheet is a summary. It may not  cover all possible information. If you have questions about this medicine, talk to your doctor, pharmacist, or health care provider.  2022 Elsevier/Gold Standard (2015-12-31 12:08:50)

## 2021-08-20 NOTE — Progress Notes (Signed)
Hematology/Oncology Follow Up Note [place] [phone] [phone]  Patient Care Team: Marguerita Merles, MD as PCP - General (Family Medicine) End, Harrell Gave, MD as PCP - Cardiology (Cardiology) Rico Junker, RN as Oncology Nurse Navigator Earlie Server, MD as Consulting Physician (Oncology) Bary Castilla, Forest Gleason, MD (General Surgery) Noreene Filbert, MD as Referring Physician (Radiation Oncology) Noreene Filbert, MD as Radiation Oncologist (Radiation Oncology) Noreene Filbert, MD as Radiation Oncologist (Radiation Oncology)   Name of the patient: Theresa Chaney  [id number]  1981/09/02   Date of visit: 08/20/21 REASON FOR VISIT Follow up for  treatment of breast cancer  Oncology History Cancer TREATMENT Neoadjuvant ddAC +one dose of Taxol, due to lack of response, surgery was offered. Case was discussed on breast tumor board. 03/19/2018 S/p right mastectomy and right axillary dissection, immediate breast reconstruction with placement of expanders.  ypT3 ypN2, + lymphovascular invasion,  Grade 3, margin is negative, close. ER 90%, PR 0%, HER2 IHC negative.  Also had elective bilateral salpingo-oophorectomy..  # 06/11/2018 s/p 11 cycles Taxol adjuvantly. Tolerated well.  # She has obtained dental clearance for starting Zometa.  S/p Zometa on 6/3/ 2019 # s/p adjuvant right chest wall radiation, finished 10/10/2018 #06/03/2019 underwent elective left prophylactic mastectomy and sentinel lymph node biopsy of left axilla.  # 06/03/2019 underwent elective left prophylactic mastectomy and sentinel lymph node biopsy of left axilla. Pathology negative for malignancy. Status post Mediport removal on 06/03/2019. She also underwent right implant removal on 06/03/2019. She also underwent right implant removal on 06/03/2019. #Negative genetic testing  #  chemotherapy-induced neuropathy, bilateral fingertips and lower extremities.  Patient is on Lyrica and  nortriptyline.  Follows up with neurology. She follows up with lymphedema clinic.  #  07/07/2020, MRI thoracic spine without contrast showed lesions involving the T7 posterior elements most concerning for metastatic lesion.  No evidence of epidural tumor.  Minimal thoracic spondylosis without stenosis. MRI was reviewed by me and a PET scan was obtained for further evaluation. 07/20/2020, PET scan showed hypermetabolic metastasis involving the posterior element of T7, no additional evidence of metastasis in the neck, chest, abdomen or pelvis.  # 07/29/2020 T7 lesion biopsy showed metastatic carcinoma, compatible with breast origin.  Receptor status staining showed ER 71-80% positive, PR negative, HER-2 positive IHC 3+ # Patient finished spine radiation on 08/31/2020   positive for COVID-19 on 01/11/2021  # 03/15/2021, PET scan showed no focal hypermetabolic activity to suggest skeletal metastasis.  Mild hypermetabolic activity along the right T7-8 paraspinal musculature.  Max SUV 3.3.  Likely postprocedural-  #Lower extremity cramps when she eats banana or oral potassium supplementation.not able to tolerate po potassium.   INTERVAL HISTORY 40 yo female with above oncology history reviewed by me presents for follow-up of management of metastatic breast cancer.  Chronic intermittent back pain Patient has had ultrasound thyroid done recently for evaluation of thyroid nodules.  No new complaints.  Chronic neuropathy.   Review of Systems  Constitutional:  Positive for weight loss. Negative for chills, fever and malaise/fatigue.  HENT:  Negative for sore throat.   Eyes:  Negative for redness.  Respiratory:  Negative for cough, shortness of breath and wheezing.   Cardiovascular:  Negative for chest pain, palpitations and leg swelling.  Gastrointestinal:  Negative for abdominal pain, blood in stool, heartburn, nausea and vomiting.  Genitourinary:  Negative for dysuria.  Musculoskeletal:  Positive for  back pain and joint pain. Negative for myalgias.  Skin:  Negative for  rash.  Neurological:  Positive for tingling. Negative for dizziness and tremors.  Endo/Heme/Allergies:  Does not bruise/bleed easily.  Psychiatric/Behavioral:  Negative for hallucinations.    No Known Allergies  Patient Active Problem List   Diagnosis Date Noted   Tachycardia, unspecified 04/15/2021   Bone metastasis ([place]) 03/25/2021   Encounter for monoclonal antibody treatment for malignancy 12/10/2020   Bone lesion 11/19/2020   Hypocalcemia 11/19/2020   Inflammatory arthritis 11/19/2020   Encounter for antineoplastic chemotherapy 10/29/2020   Metastatic breast cancer ([place]) 08/31/2020   Goals of care, counseling/discussion 08/11/2020   Neuropathy due to chemotherapeutic drug ([place]) 08/11/2020   HER2-positive carcinoma of breast ([place]) 08/11/2020   Rheumatoid arteritis ([place]) 10/13/2019   Bilateral hand swelling 10/03/2019   Fracture of neck of metacarpal bone 05/14/2019   Chronic fatigue 04/09/2019   Polyarthralgia 04/09/2019   Status post right breast reconstruction 02/26/2019   Status post right mastectomy 02/26/2019   Mastalgia 02/15/2019   Shortness of breath 08/23/2018   Nonischemic cardiomyopathy ([place]) 08/23/2018   Preprocedural cardiovascular examination 08/23/2018   Tachycardia 08/23/2018   Palpitations 08/23/2018   Estrogen receptor positive status (ER+) 04/04/2018   Acquired absence of right breast and nipple 04/03/2018   Breast cancer of upper-outer quadrant of right female breast ([place]) 03/19/2018   Family history of cancer    Malignant neoplasm of overlapping sites of right breast in female, estrogen receptor positive ([place]) 10/19/2017   Gastroesophageal reflux disease without esophagitis 02/24/2017   Generalized anxiety disorder 10/03/2014   Headache 10/03/2014     Past Medical History:  Diagnosis Date   Anemia    Arthritis    BRCA negative 11/26/2017   Breast cancer ([place]) 10/11/2017    Multifocal, ER positive, PR negative, HER-2 negative. ypT3 ypN2a 8.7 cm, 4/15 nodes   Cardiomyopathy ([place])    a. 10/2017 Echo: EF 60-65%, no rwma, Gr1 DD, nl RV size/fxn; b. 04/2018 Echo: EF 55-60%, no rwma, Nl RV size/fxn; c. 08/2018 Echo: EF 45%, diff HK, ? HK of antsept wall. Gr1 DD. Mild MR. Mild LAE/RAE. Mod dil RV.    Chronic bronchitis ([place]) 11/2017   COPD (chronic obstructive pulmonary disease) (HCC)    MILD PER CXR   Depression    Family history of cancer    GERD (gastroesophageal reflux disease)    Headache    MIGRAINES   Heart murmur    ASYMPTOMATIC   Personal history of chemotherapy    current for right breast ca     Past Surgical History:  Procedure Laterality Date   AXILLARY LYMPH NODE DISSECTION Right 03/19/2018   Procedure: AXILLARY LYMPH NODE DISSECTION;  Surgeon: Robert Bellow, MD;  Location: [facility];  Service: General;  Laterality: Right;   BREAST BIOPSY Right 10/11/2017   12:30 posterior coil clip invasive mammary carcinoma   BREAST BIOPSY Right 10/11/2017   11:30 middle depth ribbon clip DCIS   BREAST BIOPSY Right 10/11/2017   5:30 anterior depth x shape invasive ductal carcinoma   BREAST IMPLANT REMOVAL Right 06/03/2019   Procedure: REMOVAL OF RIGHT BREAST IMPLANTS;  Surgeon: Wallace Going, DO;  Location: [facility];  Service: Plastics;  Laterality: Right;   BREAST RECONSTRUCTION WITH PLACEMENT OF TISSUE EXPANDER AND FLEX HD (ACELLULAR HYDRATED DERMIS) Right 03/19/2018   Procedure: BREAST RECONSTRUCTION WITH PLACEMENT OF TISSUE EXPANDER AND FLEX HD (ACELLULAR HYDRATED DERMIS);  Surgeon: Wallace Going, DO;  Location: [facility];  Service: Plastics;  Laterality: Right;   CARPAL TUNNEL RELEASE Bilateral 2020  CHOLECYSTECTOMY N/A 04/27/2020   Procedure: LAPAROSCOPIC CHOLECYSTECTOMY WITH INTRAOPERATIVE CHOLANGIOGRAM;  Surgeon: Robert Bellow, MD;  Location: [facility];  Service: General;  Laterality: N/A;   ESOPHAGOGASTRODUODENOSCOPY (EGD) WITH PROPOFOL  N/A 04/17/2020   Procedure: ESOPHAGOGASTRODUODENOSCOPY (EGD) WITH PROPOFOL;  Surgeon: Robert Bellow, MD;  Location: [facility];  Service: Endoscopy;  Laterality: N/A;  with biopsy   LAPAROSCOPIC BILATERAL SALPINGO OOPHERECTOMY Bilateral 03/19/2018   Procedure: LAPAROSCOPIC BILATERAL SALPINGO OOPHORECTOMY;  Surgeon: Benjaman Kindler, MD;  Location: [facility];  Service: Gynecology;  Laterality: Bilateral;   MASTECTOMY Right 03/2018   MASTECTOMY W/ SENTINEL NODE BIOPSY Right 03/19/2018   Procedure: MASTECTOMY WITH SENTINEL LYMPH NODE BIOPSY;  Surgeon: Robert Bellow, MD;  Location: [facility];  Service: General;  Laterality: Right;   PORT-A-CATH REMOVAL Left 06/03/2019   Procedure: REMOVAL PORT-A-CATH;  Surgeon: Robert Bellow, MD;  Location: [facility];  Service: General;  Laterality: Left;   PORTACATH PLACEMENT Left 10/24/2017   Procedure: INSERTION PORT-A-CATH;  Surgeon: Robert Bellow, MD;  Location: [facility];  Service: General;  Laterality: Left;   PORTACATH PLACEMENT Right 09/28/2020   Procedure: INSERTION PORT-A-CATH;  Surgeon: Robert Bellow, MD;  Location: [facility];  Service: General;  Laterality: Right;   REMOVAL OF TISSUE EXPANDER AND PLACEMENT OF IMPLANT Right 07/20/2018   Procedure: REMOVAL OF RIGHT BREAST TISSUE EXPANDER AND PLACEMENT OF IMPLANT;  Surgeon: Wallace Going, DO;  Location: [facility];  Service: Plastics;  Laterality: Right;   SIMPLE MASTECTOMY WITH AXILLARY SENTINEL NODE BIOPSY Left 06/03/2019   Procedure: SIMPLE MASTECTOMY LEFT;  Surgeon: Robert Bellow, MD;  Location: [facility];  Service: General;  Laterality: Left;    Social History   Socioeconomic History   Marital status: Married    Spouse name: Not on file   Number of children: Not on file   Years of education: Not on file   Highest education level: Not on file  Occupational History   Occupation: pharmacy tech    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 [employer]   Tobacco  Use   Smoking status: Every Day    Packs/day: 0.50    Years: 18.00    Pack years: 9.00    Types: Cigarettes    Start date: 06/21/2018   Smokeless tobacco: Never  Vaping Use   Vaping Use: Never used  Substance and Sexual Activity   Alcohol use: No   Drug use: No   Sexual activity: Yes    Birth control/protection: Injection, Other-see comments    Comment: has had hysterectomy  Other Topics Concern   Not on file  Social History Narrative   Lives at home with husband and daughter   Social Determinants of Health   Financial Resource Strain: Not on file  Food Insecurity: Not on file  Transportation Needs: Not on file  Physical Activity: Not on file  Stress: Not on file  Social Connections: Not on file  Intimate Partner Violence: Not on file     Family History  Problem Relation Age of Onset   Melanoma Maternal Aunt        other aunts with BCC/SCC/Melanoma   Diabetes Father    Hypertension Father    Hyperlipidemia Father    Heart attack Father 80       "mild"   Bladder Cancer Maternal Grandmother    Cervical cancer Maternal Aunt 64       daughter w/ cervical cancer as well   Melanoma Maternal Uncle  other uncles with BCC/SCC/Melanoma     Current Outpatient Medications:    acetaminophen (TYLENOL) 500 MG tablet, Take 500 mg by mouth every 6 (six) hours as needed., Disp: , Rfl:    albuterol (VENTOLIN HFA) 108 (90 Base) MCG/ACT inhaler, Inhale 2 puffs into the lungs every 6 (six) hours as needed for wheezing or shortness of breath. , Disp: , Rfl:    Calcium Carbonate-Vitamin D 600-400 MG-UNIT tablet, Take 3 tablets by mouth daily., Disp: , Rfl:    cholecalciferol (VITAMIN D3) 25 MCG (1000 UNIT) tablet, Take 1 tablet (1,000 Units total) by mouth daily., Disp: 90 tablet, Rfl: 0   cyanocobalamin (,VITAMIN B-12,) 1000 MCG/ML injection, Inject 1,000 mcg into the skin every 30 (thirty) days., Disp: , Rfl:    cyclobenzaprine (FLEXERIL) 5 MG tablet, Take 5 mg by mouth 3  (three) times daily as needed for muscle spasms. , Disp: , Rfl:    diphenoxylate-atropine (LOMOTIL) 2.5-0.025 MG tablet, Take 1 tablet by mouth 4 (four) times daily as needed for diarrhea or loose stools., Disp: 60 tablet, Rfl: 0   escitalopram (LEXAPRO) 20 MG tablet, Take 20 mg by mouth daily., Disp: , Rfl:    esomeprazole (NEXIUM) 40 MG capsule, Take 40 mg by mouth daily before breakfast. , Disp: , Rfl:    Eszopiclone 3 MG TABS, Take 3 mg by mouth at bedtime as needed (sleep). , Disp: , Rfl:    folic acid (FOLVITE) 1 MG tablet, Take 1 mg by mouth daily., Disp: , Rfl:    hydroxychloroquine (PLAQUENIL) 200 MG tablet, Take 200 mg by mouth daily., Disp: , Rfl:    ibuprofen (ADVIL) 800 MG tablet, Take 800 mg by mouth every 8 (eight) hours as needed for moderate pain., Disp: , Rfl:    lidocaine-prilocaine (EMLA) cream, Apply to port and cover 1-2 hours prior to appointment, Disp: 30 g, Rfl: 3   loperamide (IMODIUM) 2 MG capsule, Take 2 tablets with onset of diarrhea, then take 1 tablet every 2 hours until diarrhea stops. Maximum 8 tablets in 24hours, Disp: 30 capsule, Rfl: 3   loratadine (CLARITIN) 10 MG tablet, Take 10 mg by mouth daily. , Disp: , Rfl:    LORazepam (ATIVAN) 1 MG tablet, Take 1 mg by mouth 3 (three) times daily., Disp: , Rfl:    Magnesium 400 MG TABS, Take 400 mg by mouth 2 (two) times daily., Disp: , Rfl:    meloxicam (MOBIC) 7.5 MG tablet, Take 7.5 mg by mouth 2 (two) times daily as needed (pain/inflammation.). , Disp: , Rfl:    methotrexate (RHEUMATREX) 2.5 MG tablet, Take 25 mg by mouth every Sunday. 10 tablets once a week, Disp: , Rfl:    metoprolol succinate (TOPROL-XL) 25 MG 24 hr tablet, Take 37.5 mg by mouth every morning. , Disp: , Rfl:    mupirocin ointment (BACTROBAN) 2 %, Place 1 application into the nose 2 (two) times daily. Use in each nostril twice daily for five (5) days., Disp: 22 g, Rfl: 5   nortriptyline (PAMELOR) 10 MG capsule, Take 30 mg by mouth at bedtime. ,  Disp: , Rfl:    oxyCODONE (OXY IR/ROXICODONE) 5 MG immediate release tablet, Take 1 tablet (5 mg total) by mouth every 6 (six) hours as needed for moderate pain or severe pain., Disp: 90 tablet, Rfl: 0   phentermine (ADIPEX-P) 37.5 MG tablet, Take 37.5 mg by mouth daily before breakfast. , Disp: , Rfl:    pregabalin (LYRICA) 150 MG capsule, Take 150  mg by mouth 2 (two) times daily., Disp: , Rfl:    promethazine (PHENERGAN) 25 MG tablet, Take 1 tablet (25 mg total) by mouth every 8 (eight) hours as needed for nausea or vomiting., Disp: 90 tablet, Rfl: 0   pyridOXINE (VITAMIN B-6) 100 MG tablet, Take 100 mg by mouth daily., Disp: , Rfl:    topiramate (TOPAMAX) 50 MG tablet, Take 50 mg by mouth 2 (two) times daily. , Disp: , Rfl:    vitamin C (ASCORBIC ACID) 500 MG tablet, Take 500 mg by mouth daily., Disp: , Rfl:  No current facility-administered medications for this visit.  Facility-Administered Medications Ordered in Other Visits:    heparin lock flush 100 unit/mL, 500 Units, Intravenous, Once, Earlie Server, MD   sodium chloride flush (NS) 0.9 % injection 10 mL, 10 mL, Intravenous, PRN, Earlie Server, MD, 10 mL at 11/19/18 1249   sodium chloride flush (NS) 0.9 % injection 10 mL, 10 mL, Intravenous, Once, Earlie Server, MD   Physical exam:  Vitals:   08/20/21 0904  BP: 116/65  Pulse: 88  Resp: 18  Temp: (!) 96.8 F (36 C)  TempSrc: Tympanic  Weight: 183 lb 4.8 oz (83.1 kg)  ECOG 1 Physical Exam Constitutional:      General: She is not in acute distress.    Appearance: She is not diaphoretic.  HENT:     Head: Normocephalic and atraumatic.     Nose: Nose normal.     Mouth/Throat:     Pharynx: No oropharyngeal exudate.  Eyes:     General: No scleral icterus.    Pupils: Pupils are equal, round, and reactive to light.  Neck:     Vascular: No JVD.  Cardiovascular:     Rate and Rhythm: Normal rate and regular rhythm.     Heart sounds: Normal heart sounds. No murmur heard. Pulmonary:      Effort: Pulmonary effort is normal. No respiratory distress.     Breath sounds: Normal breath sounds. No wheezing or rales.  Chest:     Chest wall: No tenderness.  Abdominal:     General: Bowel sounds are normal. There is no distension.     Palpations: Abdomen is soft. There is no mass.     Tenderness: There is no abdominal tenderness. There is no rebound.  Musculoskeletal:        General: Normal range of motion.     Cervical back: Normal range of motion and neck supple.  Lymphadenopathy:     Cervical: No cervical adenopathy.  Skin:    General: Skin is warm and dry.     Findings: No erythema or rash.  Neurological:     Mental Status: She is alert and oriented to person, place, and time.     Cranial Nerves: No cranial nerve deficit.     Motor: No abnormal muscle tone.     Coordination: Coordination normal.  Psychiatric:        Mood and Affect: Affect normal.       Labs  CMP Latest Ref Rng & Units 08/20/2021  Glucose 70 - 99 mg/dL 106(H)  BUN 6 - 20 mg/dL 14  Creatinine 0.44 - 1.00 mg/dL 0.74  Sodium 135 - 145 mmol/L 139  Potassium 3.5 - 5.1 mmol/L 3.3(L)  Chloride 98 - 111 mmol/L 108  CO2 22 - 32 mmol/L 24  Calcium 8.9 - 10.3 mg/dL 9.0  Total Protein 6.5 - 8.1 g/dL 7.1  Total Bilirubin 0.3 - 1.2 mg/dL 0.5  Alkaline Phos 38 - 126 U/L 68  AST 15 - 41 U/L 29  ALT 0 - 44 U/L 20   CBC Latest Ref Rng & Units 08/20/2021  WBC 4.0 - 10.5 K/uL 6.8  Hemoglobin 12.0 - 15.0 g/dL 10.2(L)  Hematocrit 36.0 - 46.0 % 30.3(L)  Platelets 150 - 400 K/uL 280   RADIOGRAPHIC STUDIES: I have personally reviewed the radiological images as listed and agreed with the findings in the report. MR Thoracic Spine W Wo Contrast  Result Date: 07/20/2021 CLINICAL DATA:  Metastatic breast cancer. Back pain. Bone metastasis. EXAM: MRI THORACIC WITHOUT AND WITH CONTRAST TECHNIQUE: Multiplanar and multiecho pulse sequences of the thoracic spine were obtained without and with intravenous contrast. CONTRAST:   7.65m GADAVIST GADOBUTROL 1 MMOL/ML IV SOLN COMPARISON:  MRI of the thoracic spine July 07, 2020. FINDINGS: Alignment:  Physiologic. Vertebrae: Interval resolution of the increased T2 signal and contrast enhancement in the posterior elements of T7. No flow new focus of abnormal contrast enhancement identified. No fracture, evidence of discitis, or aggressive bone lesion. Cord:  Normal signal and morphology. Paraspinal and other soft tissues: 8 mm right thyroid lobe nodule. Paraspinal soft tissues are unremarkable. Disc levels: Again seen is ossification of the ligamentum flavum at C6-7. No significant disc herniation, spinal canal or neural foraminal stenosis at any level. IMPRESSION: 1. Interval resolution of the increased T2 signal contrast enhancement in the posterior elements of T7. No new lesion identified. 2. No spinal canal or neural foraminal stenosis. Electronically Signed   By: KPedro EarlsM.D.   On: 07/20/2021 09:40   [place]  Result Date: 08/09/2021 CLINICAL DATA:  8 mm thyroid nodule on the right by thoracic spine MRI EXAM: THYROID ULTRASOUND TECHNIQUE: Ultrasound examination of the thyroid gland and adjacent soft tissues was performed. COMPARISON:  07/20/2021 FINDINGS: Parenchymal Echotexture: Normal Isthmus: 5 mm Right lobe: 5.8 x 1.3 x 2.7 cm Left lobe: 5.3 x 1.3 x 1.6 cm _________________________________________________________ Estimated total number of nodules >/= 1 cm: 1 Number of spongiform nodules >/=  2 cm not described below (TR1): 0 Number of mixed cystic and solid nodules >/= 1.5 cm not described below ([place]: 0 _________________________________________________________ Nodule # 1: Location: Right; Mid Maximum size: 1.1 cm; Other 2 dimensions: 0.8 x 0.5 cm Composition: solid/almost completely solid (2) Echogenicity: hypoechoic (2) Shape: not taller-than-wide (0) Margins: smooth (0) Echogenic foci: none (0) ACR TI-RADS total points: 4. ACR TI-RADS risk category: TR4  (4-6 points). ACR TI-RADS recommendations: *Given size (>/= 1 - 1.4 cm) and appearance, a follow-up ultrasound in 1 year should be considered based on TI-RADS criteria. _________________________________________________________ Left thyroid lobe demonstrates subcentimeter cystic nodules all measuring 8 mm or less in size. These would not meet criteria for any biopsy or follow-up and are not fully described by TI rads criteria. No hypervascularity.  No regional adenopathy. IMPRESSION: 1.1 cm right mid thyroid TR 4 nodule meets criteria follow-up in 1 year. This correlates with the MR finding. The above is in keeping with the ACR TI-RADS recommendations - J Am Coll Radiol 2017;14:587-595. Electronically Signed   By: MJerilynn Mages  Shick M.D.   On: 08/09/2021 11:31         Assessment and plan-  Cancer Staging Breast cancer of upper-outer quadrant of right female breast ([facility] Staging form: Breast, AJCC 8th Edition - Clinical: G2, ER+, PR+, HER2- - Signed by YEarlie Server MD on 04/05/2018 Histologic grading system: 3 grade system - Pathologic stage from 04/04/2018: No Stage  Recommended (ypT3, pN2, cM0, G3, ER+, PR-, HER2-) - Signed by Earlie Server, MD on 04/04/2018 Stage prefix: Post-therapy Neoadjuvant therapy: Yes Response to neoadjuvant therapy: No response Method of lymph node assessment: Axillary lymph node dissection Multigene prognostic tests performed: None Histologic grading system: 3 grade system Laterality: Right - Pathologic: Stage IV (rpTX, pNX, cM1, ER+, PR-, HER2+) - Signed by Earlie Server, MD on 12/10/2020 Stage prefix: Recurrence    1. Metastatic breast cancer ([place])   2. Hypocalcemia   3. Thyroid nodule   4. Encounter for monoclonal antibody treatment for malignancy   5. HER2-positive carcinoma of breast ([place])   6. Hypokalemia   7. Chronic midline thoracic back pain   8. Neuropathy due to chemotherapeutic drug ([place])    Recurrent breast cancer with isolated bone metastasis, stage IV #Initially  stage IIIA right breast cancer, ER positive, HER-2 negative status post bilateral oophorectomy, Right mastectomy and right axillary lymph node dissection, status post implant, and implant removal.  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 Estrogen positive, HER-2 positive breast cancer.- s/p Palliative radiation to T7  Labs are reviewed and discussed with patient.  Proceed with maintenance trastuzumab and pertuzumab. Fulvestrant monthly  #Anemia,stable.   Likely secondary to methotrexate.   #Tachycardia, chronic intermittent issue for her.   525/22 2D echocardiogram LVEF 55%-60%. She is on metoprolol.  Obtain echocardiogram.  # Hypokalemia, does not tolerate oral potassium.  Potassium is stable. #Treatment induced diarrhea,Continue Lomotil as needed.  Stable symptom  # Bone metastasis, status post radiation. Continue Zometa monthly.  #Back pain,  MRI was reviewed and discussed with patient. T7 lesion has improved.   #Chemotherapy-induced neuropathy, on Lyrica and nortriptyline, continue.  #Depression.  Continue Lexapro 20 mg daily. #Rheumatoid arthritis,   Follow up with  rheumatology.  Patient is on methotrexate, recently added plaquenil.  # thyroid nodules -  thyroid US.  Left lobe nodules are small no need for follow-up.  Right lobe nodule 1.1 cm, repeat thyroid ultrasound in 1 year.    Follow-up in 3 weeks for the next cycle of trastuzumab and Pertuzumab treatments.  Patient knows to call clinic if she experiences concerning side effects  Earlie Server, MD, PhD Hematology Oncology [place] [facility] 08/20/21

## 2021-08-23 ENCOUNTER — Telehealth: Payer: Self-pay

## 2021-08-23 ENCOUNTER — Other Ambulatory Visit: Payer: Self-pay

## 2021-08-23 DIAGNOSIS — Z5181 Encounter for therapeutic drug level monitoring: Secondary | ICD-10-CM

## 2021-08-23 DIAGNOSIS — C50811 Malignant neoplasm of overlapping sites of right female breast: Secondary | ICD-10-CM

## 2021-08-23 NOTE — Telephone Encounter (Signed)
-----   Message from Earlie Server, MD sent at 08/20/2021  8:16 PM EDT ----- Please let her know that it is time to repeat her echo.  Please arrange.

## 2021-08-23 NOTE — Telephone Encounter (Signed)
Pt informed via Mychart that repeat echo is needed.

## 2021-08-31 ENCOUNTER — Other Ambulatory Visit: Payer: Self-pay

## 2021-08-31 ENCOUNTER — Ambulatory Visit
Admission: RE | Admit: 2021-08-31 | Discharge: 2021-08-31 | Disposition: A | Discharge status: Home / Self Care | Payer: Medicare HMO | Source: Ambulatory Visit | Attending: Oncology | Admitting: Oncology

## 2021-08-31 DIAGNOSIS — Z01818 Encounter for other preprocedural examination: Secondary | ICD-10-CM | POA: Diagnosis not present

## 2021-08-31 DIAGNOSIS — Z170 Estrogen receptor positive status [ER+]: Secondary | ICD-10-CM | POA: Diagnosis not present

## 2021-08-31 DIAGNOSIS — Z5181 Encounter for therapeutic drug level monitoring: Secondary | ICD-10-CM | POA: Insufficient documentation

## 2021-08-31 DIAGNOSIS — Z79899 Other long term (current) drug therapy: Secondary | ICD-10-CM | POA: Diagnosis not present

## 2021-08-31 DIAGNOSIS — C50811 Malignant neoplasm of overlapping sites of right female breast: Secondary | ICD-10-CM | POA: Diagnosis not present

## 2021-08-31 DIAGNOSIS — Z0189 Encounter for other specified special examinations: Secondary | ICD-10-CM | POA: Diagnosis not present

## 2021-08-31 NOTE — Progress Notes (Signed)
*  PRELIMINARY RESULTS* Echocardiogram 2D Echocardiogram has been performed.  Theresa Chaney 08/31/2021, 11:04 AM

## 2021-09-10 ENCOUNTER — Inpatient Hospital Stay: Payer: Medicare HMO

## 2021-09-10 ENCOUNTER — Inpatient Hospital Stay (HOSPITAL_BASED_OUTPATIENT_CLINIC_OR_DEPARTMENT_OTHER): Payer: Medicare HMO | Admitting: Oncology

## 2021-09-10 ENCOUNTER — Encounter: Payer: Self-pay | Admitting: Oncology

## 2021-09-10 VITALS — BP 105/72 | HR 93 | Temp 96.9°F | Resp 18 | Wt 184.2 lb

## 2021-09-10 DIAGNOSIS — E041 Nontoxic single thyroid nodule: Secondary | ICD-10-CM | POA: Diagnosis not present

## 2021-09-10 DIAGNOSIS — Z79899 Other long term (current) drug therapy: Secondary | ICD-10-CM

## 2021-09-10 DIAGNOSIS — Z5112 Encounter for antineoplastic immunotherapy: Secondary | ICD-10-CM

## 2021-09-10 DIAGNOSIS — C50411 Malignant neoplasm of upper-outer quadrant of right female breast: Secondary | ICD-10-CM

## 2021-09-10 DIAGNOSIS — C50811 Malignant neoplasm of overlapping sites of right female breast: Secondary | ICD-10-CM | POA: Diagnosis not present

## 2021-09-10 DIAGNOSIS — Z170 Estrogen receptor positive status [ER+]: Secondary | ICD-10-CM

## 2021-09-10 DIAGNOSIS — Z5181 Encounter for therapeutic drug level monitoring: Secondary | ICD-10-CM

## 2021-09-10 DIAGNOSIS — C50919 Malignant neoplasm of unspecified site of unspecified female breast: Secondary | ICD-10-CM

## 2021-09-10 LAB — CBC WITH DIFFERENTIAL/PLATELET
Abs Immature Granulocytes: 0.02 10*3/uL (ref 0.00–0.07)
Basophils Absolute: 0.1 10*3/uL (ref 0.0–0.1)
Basophils Relative: 1 %
Eosinophils Absolute: 0.1 10*3/uL (ref 0.0–0.5)
Eosinophils Relative: 2 %
HCT: 33.9 % — ABNORMAL LOW (ref 36.0–46.0)
Hemoglobin: 11.3 g/dL — ABNORMAL LOW (ref 12.0–15.0)
Immature Granulocytes: 0 %
Lymphocytes Relative: 28 %
Lymphs Abs: 1.9 10*3/uL (ref 0.7–4.0)
MCH: 30.3 pg (ref 26.0–34.0)
MCHC: 33.3 g/dL (ref 30.0–36.0)
MCV: 90.9 fL (ref 80.0–100.0)
Monocytes Absolute: 0.6 10*3/uL (ref 0.1–1.0)
Monocytes Relative: 9 %
Neutro Abs: 4.1 10*3/uL (ref 1.7–7.7)
Neutrophils Relative %: 60 %
Platelets: 314 10*3/uL (ref 150–400)
RBC: 3.73 MIL/uL — ABNORMAL LOW (ref 3.87–5.11)
RDW: 13.5 % (ref 11.5–15.5)
WBC: 6.8 10*3/uL (ref 4.0–10.5)
nRBC: 0 % (ref 0.0–0.2)

## 2021-09-10 LAB — COMPREHENSIVE METABOLIC PANEL
ALT: 20 U/L (ref 0–44)
AST: 28 U/L (ref 15–41)
Albumin: 4.4 g/dL (ref 3.5–5.0)
Alkaline Phosphatase: 71 U/L (ref 38–126)
Anion gap: 8 (ref 5–15)
BUN: 13 mg/dL (ref 6–20)
CO2: 22 mmol/L (ref 22–32)
Calcium: 9 mg/dL (ref 8.9–10.3)
Chloride: 105 mmol/L (ref 98–111)
Creatinine, Ser: 0.85 mg/dL (ref 0.44–1.00)
GFR, Estimated: >60 mL/min (ref >60)
Glucose, Bld: 120 mg/dL — ABNORMAL HIGH (ref 70–99)
Potassium: 3.6 mmol/L (ref 3.5–5.1)
Sodium: 135 mmol/L (ref 135–145)
Total Bilirubin: 0.2 mg/dL — ABNORMAL LOW (ref 0.3–1.2)
Total Protein: 7.8 g/dL (ref 6.5–8.1)

## 2021-09-10 LAB — MAGNESIUM: Magnesium: 2.2 mg/dL (ref 1.7–2.4)

## 2021-09-10 MED ORDER — SODIUM CHLORIDE 0.9 % IV SOLN
Freq: Once | INTRAVENOUS | Status: AC
  Filled 2021-09-10: qty 250

## 2021-09-10 MED ORDER — SODIUM CHLORIDE 0.9% FLUSH
10.0000 mL | Freq: Once | INTRAVENOUS | Status: AC
  Administered 2021-09-10: 10 mL via INTRAVENOUS
  Filled 2021-09-10: qty 10

## 2021-09-10 MED ORDER — HEPARIN SOD (PORK) LOCK FLUSH 100 UNIT/ML IV SOLN
INTRAVENOUS | Status: AC
  Filled 2021-09-10: qty 5

## 2021-09-10 MED ORDER — TRASTUZUMAB-ANNS CHEMO 150 MG IV SOLR
6.0000 mg/kg | Freq: Once | INTRAVENOUS | Status: AC
  Administered 2021-09-10: 504 mg via INTRAVENOUS
  Filled 2021-09-10: qty 24

## 2021-09-10 MED ORDER — DIPHENHYDRAMINE HCL 25 MG PO CAPS
50.0000 mg | ORAL_CAPSULE | Freq: Once | ORAL | Status: AC
  Administered 2021-09-10: 50 mg via ORAL
  Filled 2021-09-10: qty 2

## 2021-09-10 MED ORDER — SODIUM CHLORIDE 0.9 % IV SOLN
420.0000 mg | Freq: Once | INTRAVENOUS | Status: AC
  Administered 2021-09-10: 420 mg via INTRAVENOUS
  Filled 2021-09-10: qty 14

## 2021-09-10 MED ORDER — ACETAMINOPHEN 325 MG PO TABS
650.0000 mg | ORAL_TABLET | Freq: Once | ORAL | Status: AC
  Administered 2021-09-10: 650 mg via ORAL
  Filled 2021-09-10: qty 2

## 2021-09-10 NOTE — Progress Notes (Signed)
Hematology/Oncology Follow Up Note [place] [phone] [phone]) [phone]  Patient Care Team: Marguerita Merles, MD as PCP - General (Family Medicine) End, Harrell Gave, MD as PCP - Cardiology (Cardiology) Rico Junker, RN as Oncology Nurse Navigator Earlie Server, MD as Consulting Physician (Oncology) Bary Castilla, Forest Gleason, MD (General Surgery) Noreene Filbert, MD as Referring Physician (Radiation Oncology) Noreene Filbert, MD as Radiation Oncologist (Radiation Oncology) Noreene Filbert, MD as Radiation Oncologist (Radiation Oncology)   Name of the patient: Theresa Chaney  [id number]  28-Jan-1981   Date of visit: 09/10/21 REASON FOR VISIT Follow up for  treatment of breast cancer  Oncology History Cancer TREATMENT Neoadjuvant ddAC +one dose of Taxol, due to lack of response, surgery was offered. Case was discussed on breast tumor board. 03/19/2018 S/p right mastectomy and right axillary dissection, immediate breast reconstruction with placement of expanders.  ypT3 ypN2, + lymphovascular invasion,  Grade 3, margin is negative, close. ER 90%, PR 0%, HER2 IHC negative.  Also had elective bilateral salpingo-oophorectomy..  # 06/11/2018 s/p 11 cycles Taxol adjuvantly. Tolerated well.  # She has obtained dental clearance for starting Zometa.  S/p Zometa on 6/3/ 2019 # s/p adjuvant right chest wall radiation, finished 10/10/2018 #06/03/2019 underwent elective left prophylactic mastectomy and sentinel lymph node biopsy of left axilla.  # 06/03/2019 underwent elective left prophylactic mastectomy and sentinel lymph node biopsy of left axilla. Pathology negative for malignancy. Status post Mediport removal on 06/03/2019. She also underwent right implant removal on 06/03/2019. She also underwent right implant removal on 06/03/2019. #Negative genetic testing  #  chemotherapy-induced neuropathy, bilateral fingertips and lower extremities.  Patient is on Lyrica and  nortriptyline.  Follows up with neurology. She follows up with lymphedema clinic.  #  07/07/2020, MRI thoracic spine without contrast showed lesions involving the T7 posterior elements most concerning for metastatic lesion.  No evidence of epidural tumor.  Minimal thoracic spondylosis without stenosis. MRI was reviewed by me and a PET scan was obtained for further evaluation. 07/20/2020, PET scan showed hypermetabolic metastasis involving the posterior element of T7, no additional evidence of metastasis in the neck, chest, abdomen or pelvis.  # 07/29/2020 T7 lesion biopsy showed metastatic carcinoma, compatible with breast origin.  Receptor status staining showed ER 71-80% positive, PR negative, HER-2 positive IHC 3+ # Patient finished spine radiation on 08/31/2020   positive for COVID-19 on 01/11/2021  # 03/15/2021, PET scan showed no focal hypermetabolic activity to suggest skeletal metastasis.  Mild hypermetabolic activity along the right T7-8 paraspinal musculature.  Max SUV 3.3.  Likely postprocedural-  #Lower extremity cramps when she eats banana or oral potassium supplementation.not able to tolerate po potassium.   INTERVAL HISTORY 40 yo female with above oncology history reviewed by me presents for follow-up of management of metastatic breast cancer.  Chronic intermittent back pain No new complaints.  Chronic neuropathy.   Review of Systems  Constitutional:  Positive for weight loss. Negative for chills, fever and malaise/fatigue.  HENT:  Negative for sore throat.   Eyes:  Negative for redness.  Respiratory:  Negative for cough, shortness of breath and wheezing.   Cardiovascular:  Negative for chest pain, palpitations and leg swelling.  Gastrointestinal:  Negative for abdominal pain, blood in stool, heartburn, nausea and vomiting.  Genitourinary:  Negative for dysuria.  Musculoskeletal:  Positive for back pain and joint pain. Negative for myalgias.  Skin:  Negative for rash.   Neurological:  Positive for tingling. Negative for dizziness and tremors.  Endo/Heme/Allergies:  Does not bruise/bleed easily.  Psychiatric/Behavioral:  Negative for hallucinations.    No Known Allergies  Patient Active Problem List   Diagnosis Date Noted   Tachycardia, unspecified 04/15/2021   Bone metastasis ([place]) 03/25/2021   Encounter for monoclonal antibody treatment for malignancy 12/10/2020   Bone lesion 11/19/2020   Hypocalcemia 11/19/2020   Inflammatory arthritis 11/19/2020   Encounter for antineoplastic chemotherapy 10/29/2020   Metastatic breast cancer ([place]) 08/31/2020   Goals of care, counseling/discussion 08/11/2020   Neuropathy due to chemotherapeutic drug ([place]) 08/11/2020   HER2-positive carcinoma of breast ([place]) 08/11/2020   Rheumatoid arteritis ([place]) 10/13/2019   Bilateral hand swelling 10/03/2019   Fracture of neck of metacarpal bone 05/14/2019   Chronic fatigue 04/09/2019   Polyarthralgia 04/09/2019   Status post right breast reconstruction 02/26/2019   Status post right mastectomy 02/26/2019   Mastalgia 02/15/2019   Shortness of breath 08/23/2018   Nonischemic cardiomyopathy ([place]) 08/23/2018   Preprocedural cardiovascular examination 08/23/2018   Tachycardia 08/23/2018   Palpitations 08/23/2018   Estrogen receptor positive status (ER+) 04/04/2018   Acquired absence of right breast and nipple 04/03/2018   Breast cancer of upper-outer quadrant of right female breast ([place]) 03/19/2018   Family history of cancer    Malignant neoplasm of overlapping sites of right breast in female, estrogen receptor positive ([place]) 10/19/2017   Gastroesophageal reflux disease without esophagitis 02/24/2017   Generalized anxiety disorder 10/03/2014   Headache 10/03/2014     Past Medical History:  Diagnosis Date   Anemia    Arthritis    BRCA negative 11/26/2017   Breast cancer ([place]) 10/11/2017   Multifocal, ER positive, PR negative, HER-2 negative. ypT3 ypN2a 8.7 cm, 4/15  nodes   Cardiomyopathy ([place])    a. 10/2017 Echo: EF 60-65%, no rwma, Gr1 DD, nl RV size/fxn; b. 04/2018 Echo: EF 55-60%, no rwma, Nl RV size/fxn; c. 08/2018 Echo: EF 45%, diff HK, ? HK of antsept wall. Gr1 DD. Mild MR. Mild LAE/RAE. Mod dil RV.    Chronic bronchitis ([place]) 11/2017   COPD (chronic obstructive pulmonary disease) ([facility])    MILD PER CXR   Depression    Family history of cancer    GERD (gastroesophageal reflux disease)    Headache    MIGRAINES   Heart murmur    ASYMPTOMATIC   Personal history of chemotherapy    current for right breast ca     Past Surgical History:  Procedure Laterality Date   AXILLARY LYMPH NODE DISSECTION Right 03/19/2018   Procedure: AXILLARY LYMPH NODE DISSECTION;  Surgeon: Robert Bellow, MD;  Location: [facility];  Service: General;  Laterality: Right;   BREAST BIOPSY Right 10/11/2017   12:30 posterior coil clip invasive mammary carcinoma   BREAST BIOPSY Right 10/11/2017   11:30 middle depth ribbon clip DCIS   BREAST BIOPSY Right 10/11/2017   5:30 anterior depth x shape invasive ductal carcinoma   BREAST IMPLANT REMOVAL Right 06/03/2019   Procedure: REMOVAL OF RIGHT BREAST IMPLANTS;  Surgeon: Wallace Going, DO;  Location: [facility];  Service: Plastics;  Laterality: Right;   BREAST RECONSTRUCTION WITH PLACEMENT OF TISSUE EXPANDER AND FLEX HD (ACELLULAR HYDRATED DERMIS) Right 03/19/2018   Procedure: BREAST RECONSTRUCTION WITH PLACEMENT OF TISSUE EXPANDER AND FLEX HD (ACELLULAR HYDRATED DERMIS);  Surgeon: Wallace Going, DO;  Location: [facility];  Service: Plastics;  Laterality: Right;   CARPAL TUNNEL RELEASE Bilateral 2020   CHOLECYSTECTOMY N/A 04/27/2020   Procedure: LAPAROSCOPIC CHOLECYSTECTOMY WITH INTRAOPERATIVE CHOLANGIOGRAM;  Surgeon: Robert Bellow, MD;  Location: [facility];  Service: General;  Laterality: N/A;   ESOPHAGOGASTRODUODENOSCOPY (EGD) WITH PROPOFOL N/A 04/17/2020   Procedure: ESOPHAGOGASTRODUODENOSCOPY (EGD) WITH PROPOFOL;   Surgeon: Robert Bellow, MD;  Location: [facility];  Service: Endoscopy;  Laterality: N/A;  with biopsy   LAPAROSCOPIC BILATERAL SALPINGO OOPHERECTOMY Bilateral 03/19/2018   Procedure: LAPAROSCOPIC BILATERAL SALPINGO OOPHORECTOMY;  Surgeon: Benjaman Kindler, MD;  Location: [facility];  Service: Gynecology;  Laterality: Bilateral;   MASTECTOMY Right 03/2018   MASTECTOMY W/ SENTINEL NODE BIOPSY Right 03/19/2018   Procedure: MASTECTOMY WITH SENTINEL LYMPH NODE BIOPSY;  Surgeon: Robert Bellow, MD;  Location: [facility];  Service: General;  Laterality: Right;   PORT-A-CATH REMOVAL Left 06/03/2019   Procedure: REMOVAL PORT-A-CATH;  Surgeon: Robert Bellow, MD;  Location: [facility];  Service: General;  Laterality: Left;   PORTACATH PLACEMENT Left 10/24/2017   Procedure: INSERTION PORT-A-CATH;  Surgeon: Robert Bellow, MD;  Location: [facility];  Service: General;  Laterality: Left;   PORTACATH PLACEMENT Right 09/28/2020   Procedure: INSERTION PORT-A-CATH;  Surgeon: Robert Bellow, MD;  Location: [facility];  Service: General;  Laterality: Right;   REMOVAL OF TISSUE EXPANDER AND PLACEMENT OF IMPLANT Right 07/20/2018   Procedure: REMOVAL OF RIGHT BREAST TISSUE EXPANDER AND PLACEMENT OF IMPLANT;  Surgeon: Wallace Going, DO;  Location: [place];  Service: Plastics;  Laterality: Right;   SIMPLE MASTECTOMY WITH AXILLARY SENTINEL NODE BIOPSY Left 06/03/2019   Procedure: SIMPLE MASTECTOMY LEFT;  Surgeon: Robert Bellow, MD;  Location: [facility];  Service: General;  Laterality: Left;    Social History   Socioeconomic History   Marital status: Married    Spouse name: Not on file   Number of children: Not on file   Years of education: Not on file   Highest education level: Not on file  Occupational History   Occupation: pharmacy tech    Comment: [employer]   Tobacco Use   Smoking status: Every Day    Packs/day: 0.50    Years: 18.00    Pack  years: 9.00    Types: Cigarettes    Start date: 06/21/2018   Smokeless tobacco: Never  Vaping Use   Vaping Use: Never used  Substance and Sexual Activity   Alcohol use: No   Drug use: No   Sexual activity: Yes    Birth control/protection: Injection, Other-see comments    Comment: has had hysterectomy  Other Topics Concern   Not on file  Social History Narrative   Lives at home with husband and daughter   Social Determinants of Health   Financial Resource Strain: Not on file  Food Insecurity: Not on file  Transportation Needs: Not on file  Physical Activity: Not on file  Stress: Not on file  Social Connections: Not on file  Intimate Partner Violence: Not on file     Family History  Problem Relation Age of Onset   Melanoma Maternal Aunt        other aunts with BCC/SCC/Melanoma   Diabetes Father    Hypertension Father    Hyperlipidemia Father    Heart attack Father 22       "mild"   Bladder Cancer Maternal Grandmother    Cervical cancer Maternal Aunt 60       daughter w/ cervical cancer as well   Melanoma Maternal Uncle        other uncles with BCC/SCC/Melanoma  Biological mother had  Grave's disease.    Current Outpatient Medications:    acetaminophen (TYLENOL) 500 MG tablet, Take 500 mg by mouth every 6 (six) hours as needed., Disp: , Rfl:    albuterol (VENTOLIN HFA) 108 (90 Base) MCG/ACT inhaler, Inhale 2 puffs into the lungs every 6 (six) hours as needed for wheezing or shortness of breath. , Disp: , Rfl:    Calcium Carbonate-Vitamin D 600-400 MG-UNIT tablet, Take 3 tablets by mouth daily., Disp: , Rfl:    cholecalciferol (VITAMIN D3) 25 MCG (1000 UNIT) tablet, Take 1 tablet (1,000 Units total) by mouth daily., Disp: 90 tablet, Rfl: 0   cyanocobalamin (,VITAMIN B-12,) 1000 MCG/ML injection, Inject 1,000 mcg into the skin every 30 (thirty) days., Disp: , Rfl:    cyclobenzaprine (FLEXERIL) 5 MG tablet, Take 5 mg by mouth 3 (three) times daily as needed for muscle  spasms. , Disp: , Rfl:    diphenoxylate-atropine (LOMOTIL) 2.5-0.025 MG tablet, Take 1 tablet by mouth 4 (four) times daily as needed for diarrhea or loose stools., Disp: 60 tablet, Rfl: 0   escitalopram (LEXAPRO) 20 MG tablet, Take 20 mg by mouth daily., Disp: , Rfl:    esomeprazole (NEXIUM) 40 MG capsule, Take 40 mg by mouth daily before breakfast. , Disp: , Rfl:    Eszopiclone 3 MG TABS, Take 3 mg by mouth at bedtime as needed (sleep). , Disp: , Rfl:    folic acid (FOLVITE) 1 MG tablet, Take 1 mg by mouth daily., Disp: , Rfl:    hydroxychloroquine (PLAQUENIL) 200 MG tablet, Take 200 mg by mouth daily., Disp: , Rfl:    ibuprofen (ADVIL) 800 MG tablet, Take 800 mg by mouth every 8 (eight) hours as needed for moderate pain., Disp: , Rfl:    lidocaine-prilocaine (EMLA) cream, Apply to port and cover 1-2 hours prior to appointment, Disp: 30 g, Rfl: 3   loperamide (IMODIUM) 2 MG capsule, Take 2 tablets with onset of diarrhea, then take 1 tablet every 2 hours until diarrhea stops. Maximum 8 tablets in 24hours, Disp: 30 capsule, Rfl: 3   loratadine (CLARITIN) 10 MG tablet, Take 10 mg by mouth daily. , Disp: , Rfl:    LORazepam (ATIVAN) 1 MG tablet, Take 1 mg by mouth 3 (three) times daily., Disp: , Rfl:    Magnesium 400 MG TABS, Take 400 mg by mouth 2 (two) times daily., Disp: , Rfl:    meloxicam (MOBIC) 7.5 MG tablet, Take 7.5 mg by mouth 2 (two) times daily as needed (pain/inflammation.). , Disp: , Rfl:    methotrexate (RHEUMATREX) 2.5 MG tablet, Take 25 mg by mouth every Sunday. 10 tablets once a week, Disp: , Rfl:    metoprolol succinate (TOPROL-XL) 25 MG 24 hr tablet, Take 37.5 mg by mouth every morning. , Disp: , Rfl:    mupirocin ointment (BACTROBAN) 2 %, Place 1 application into the nose 2 (two) times daily. Use in each nostril twice daily for five (5) days., Disp: 22 g, Rfl: 5   nortriptyline (PAMELOR) 10 MG capsule, Take 30 mg by mouth at bedtime. , Disp: , Rfl:    oxyCODONE (OXY  IR/ROXICODONE) 5 MG immediate release tablet, Take 1 tablet (5 mg total) by mouth every 6 (six) hours as needed for moderate pain or severe pain., Disp: 90 tablet, Rfl: 0   phentermine (ADIPEX-P) 37.5 MG tablet, Take 37.5 mg by mouth daily before breakfast. , Disp: , Rfl:    pregabalin (LYRICA) 150 MG capsule, Take 150 mg by mouth  2 (two) times daily., Disp: , Rfl:    promethazine (PHENERGAN) 25 MG tablet, Take 1 tablet (25 mg total) by mouth every 8 (eight) hours as needed for nausea or vomiting., Disp: 90 tablet, Rfl: 0   pyridOXINE (VITAMIN B-6) 100 MG tablet, Take 100 mg by mouth daily., Disp: , Rfl:    topiramate (TOPAMAX) 50 MG tablet, Take 50 mg by mouth 2 (two) times daily. , Disp: , Rfl:    vitamin C (ASCORBIC ACID) 500 MG tablet, Take 500 mg by mouth daily., Disp: , Rfl:  No current facility-administered medications for this visit.  Facility-Administered Medications Ordered in Other Visits:    heparin lock flush 100 unit/mL, 500 Units, Intravenous, Once, Earlie Server, MD   sodium chloride flush (NS) 0.9 % injection 10 mL, 10 mL, Intravenous, PRN, Earlie Server, MD, 10 mL at 11/19/18 1249   sodium chloride flush (NS) 0.9 % injection 10 mL, 10 mL, Intravenous, Once, Earlie Server, MD   Physical exam:  Vitals:   09/10/21 0840  BP: 105/72  Pulse: 93  Resp: 18  Temp: (!) 96.9 F (36.1 C)  SpO2: 95%  Weight: 184 lb 3.2 oz (83.6 kg)  ECOG 1 Physical Exam Constitutional:      General: She is not in acute distress.    Appearance: She is not diaphoretic.  HENT:     Head: Normocephalic and atraumatic.     Nose: Nose normal.     Mouth/Throat:     Pharynx: No oropharyngeal exudate.  Eyes:     General: No scleral icterus.    Pupils: Pupils are equal, round, and reactive to light.  Neck:     Vascular: No JVD.  Cardiovascular:     Rate and Rhythm: Normal rate and regular rhythm.     Heart sounds: Normal heart sounds. No murmur heard. Pulmonary:     Effort: Pulmonary effort is normal. No  respiratory distress.     Breath sounds: Normal breath sounds. No wheezing or rales.  Chest:     Chest wall: No tenderness.  Abdominal:     General: Bowel sounds are normal. There is no distension.     Palpations: Abdomen is soft. There is no mass.     Tenderness: There is no abdominal tenderness. There is no rebound.  Musculoskeletal:        General: Normal range of motion.     Cervical back: Normal range of motion and neck supple.  Lymphadenopathy:     Cervical: No cervical adenopathy.  Skin:    General: Skin is warm and dry.     Findings: No erythema or rash.  Neurological:     Mental Status: She is alert and oriented to person, place, and time.     Cranial Nerves: No cranial nerve deficit.     Motor: No abnormal muscle tone.     Coordination: Coordination normal.  Psychiatric:        Mood and Affect: Affect normal.       Labs  CMP Latest Ref Rng & Units 08/20/2021  Glucose 70 - 99 mg/dL 106(H)  BUN 6 - 20 mg/dL 14  Creatinine 0.44 - 1.00 mg/dL 0.74  Sodium 135 - 145 mmol/L 139  Potassium 3.5 - 5.1 mmol/L 3.3(L)  Chloride 98 - 111 mmol/L 108  CO2 22 - 32 mmol/L 24  Calcium 8.9 - 10.3 mg/dL 9.0  Total Protein 6.5 - 8.1 g/dL 7.1  Total Bilirubin 0.3 - 1.2 mg/dL 0.5  Alkaline Phos 38 -  126 U/L 68  AST 15 - 41 U/L 29  ALT 0 - 44 U/L 20   CBC Latest Ref Rng & Units 09/10/2021  WBC 4.0 - 10.5 K/uL 6.8  Hemoglobin 12.0 - 15.0 g/dL 11.3(L)  Hematocrit 36.0 - 46.0 % 33.9(L)  Platelets 150 - 400 K/uL 314   RADIOGRAPHIC STUDIES: I have personally reviewed the radiological images as listed and agreed with the findings in the report. MR Thoracic Spine W Wo Contrast  Result Date: 07/20/2021 CLINICAL DATA:  Metastatic breast cancer. Back pain. Bone metastasis. EXAM: MRI THORACIC WITHOUT AND WITH CONTRAST TECHNIQUE: Multiplanar and multiecho pulse sequences of the thoracic spine were obtained without and with intravenous contrast. CONTRAST:  7.43m GADAVIST GADOBUTROL 1 MMOL/ML  IV SOLN COMPARISON:  MRI of the thoracic spine July 07, 2020. FINDINGS: Alignment:  Physiologic. Vertebrae: Interval resolution of the increased T2 signal and contrast enhancement in the posterior elements of T7. No flow new focus of abnormal contrast enhancement identified. No fracture, evidence of discitis, or aggressive bone lesion. Cord:  Normal signal and morphology. Paraspinal and other soft tissues: 8 mm right thyroid lobe nodule. Paraspinal soft tissues are unremarkable. Disc levels: Again seen is ossification of the ligamentum flavum at C6-7. No significant disc herniation, spinal canal or neural foraminal stenosis at any level. IMPRESSION: 1. Interval resolution of the increased T2 signal contrast enhancement in the posterior elements of T7. No new lesion identified. 2. No spinal canal or neural foraminal stenosis. Electronically Signed   By: KPedro EarlsM.D.   On: 07/20/2021 09:40   ECHOCARDIOGRAM COMPLETE  Result Date: 08/31/2021    ECHOCARDIOGRAM REPORT   Patient Name:   DROVENA HEARLDDate of Exam: 08/31/2021 Medical Rec #:  [id number]     Height:       67.0 in Accession #:    [id number]    Weight:       183.3 lb Date of Birth:  [birth year]     BSA:          1.949 m Patient Age:    89years       BP:           116/65 mmHg Patient Gender: F              HR:           81 bpm. Exam Location:  [facility] Procedure: 2D Echo, Color Doppler and Cardiac Doppler Indications:     Z09 Chemotherapy  History:         Patient has prior history of Echocardiogram examinations, most                  recent 05/05/2021. Breast cancer.  Sonographer:     JCharmayne SheerReferring Phys:  [id number] Amareon Phung Diagnosing Phys: CNelva BushMD  Sonographer Comments: Suboptimal apical window. Image acquisition challenging due to mastectomy. Global longitudinal strain was attempted. IMPRESSIONS  1. Left ventricular ejection fraction, by estimation, is 50 to 55%. The left ventricle has low normal function. Left  ventricular endocardial border not optimally defined to evaluate regional wall motion. Left ventricular diastolic parameters were normal. The average left ventricular global longitudinal strain is -13.4 %. The global longitudinal strain is abnormal.  2. Right ventricular systolic function is normal. The right ventricular size is normal.  3. The mitral valve is normal in structure. No evidence of mitral valve regurgitation. No evidence of mitral stenosis.  4. The aortic valve is  tricuspid. Aortic valve regurgitation is trivial. No aortic stenosis is present. FINDINGS  Left Ventricle: Left ventricular ejection fraction, by estimation, is 50 to 55%. The left ventricle has low normal function. Left ventricular endocardial border not optimally defined to evaluate regional wall motion. The average left ventricular global longitudinal strain is -13.4 %. The global longitudinal strain is abnormal. The left ventricular internal cavity size was normal in size. There is no left ventricular hypertrophy. Left ventricular diastolic parameters were normal. Right Ventricle: The right ventricular size is normal. No increase in right ventricular wall thickness. Right ventricular systolic function is normal. Left Atrium: Left atrial size was normal in size. Right Atrium: Right atrial size was normal in size. Pericardium: There is no evidence of pericardial effusion. Mitral Valve: The mitral valve is normal in structure. No evidence of mitral valve regurgitation. No evidence of mitral valve stenosis. Tricuspid Valve: The tricuspid valve is normal in structure. Tricuspid valve regurgitation is trivial. Aortic Valve: The aortic valve is tricuspid. Aortic valve regurgitation is trivial. No aortic stenosis is present. Pulmonic Valve: The pulmonic valve was grossly normal. Pulmonic valve regurgitation is not visualized. No evidence of pulmonic stenosis. Aorta: The aortic root is normal in size and structure. Pulmonary Artery: The pulmonary  artery is of normal size. Venous: The inferior vena cava was not well visualized. IAS/Shunts: The interatrial septum was not well visualized.   2D Longitudinal Strain 2D Strain GLS Avg:     -13.4 % Nelva Bush MD Electronically signed by Nelva Bush MD Signature Date/Time: 08/31/2021/5:31:00 PM    Final    US THYROID  Result Date: 08/09/2021 CLINICAL DATA:  8 mm thyroid nodule on the right by thoracic spine MRI EXAM: THYROID ULTRASOUND TECHNIQUE: Ultrasound examination of the thyroid gland and adjacent soft tissues was performed. COMPARISON:  07/20/2021 FINDINGS: Parenchymal Echotexture: Normal Isthmus: 5 mm Right lobe: 5.8 x 1.3 x 2.7 cm Left lobe: 5.3 x 1.3 x 1.6 cm _________________________________________________________ Estimated total number of nodules >/= 1 cm: 1 Number of spongiform nodules >/=  2 cm not described below (TR1): 0 Number of mixed cystic and solid nodules >/= 1.5 cm not described below ([place]): 0 _________________________________________________________ Nodule # 1: Location: Right; Mid Maximum size: 1.1 cm; Other 2 dimensions: 0.8 x 0.5 cm Composition: solid/almost completely solid (2) Echogenicity: hypoechoic (2) Shape: not taller-than-wide (0) Margins: smooth (0) Echogenic foci: none (0) ACR TI-RADS total points: 4. ACR TI-RADS risk category: TR4 (4-6 points). ACR TI-RADS recommendations: *Given size (>/= 1 - 1.4 cm) and appearance, a follow-up ultrasound in 1 year should be considered based on TI-RADS criteria. _________________________________________________________ Left thyroid lobe demonstrates subcentimeter cystic nodules all measuring 8 mm or less in size. These would not meet criteria for any biopsy or follow-up and are not fully described by TI rads criteria. No hypervascularity.  No regional adenopathy. IMPRESSION: 1.1 cm right mid thyroid TR 4 nodule meets criteria follow-up in 1 year. This correlates with the MR finding. The above is in keeping with the ACR TI-RADS  recommendations - J Am Coll Radiol 2017;14:587-595. Electronically Signed   By: Jerilynn Mages.  Shick M.D.   On: 08/09/2021 11:31         Assessment and plan-  Cancer Staging Breast cancer of upper-outer quadrant of right female breast [facility]) Staging form: Breast, AJCC 8th Edition - Clinical: G2, ER+, PR+, HER2- - Signed by Earlie Server, MD on 04/05/2018 Histologic grading system: 3 grade system - Pathologic stage from 04/04/2018: No Stage Recommended (ypT3, pN2, cM0,  G3, ER+, PR-, HER2-) - Signed by Earlie Server, MD on 04/04/2018 Stage prefix: Post-therapy Neoadjuvant therapy: Yes Response to neoadjuvant therapy: No response Method of lymph node assessment: Axillary lymph node dissection Multigene prognostic tests performed: None Histologic grading system: 3 grade system Laterality: Right - Pathologic: Stage IV (rpTX, pNX, cM1, ER+, PR-, HER2+) - Signed by Earlie Server, MD on 12/10/2020 Stage prefix: Recurrence    1. Malignant neoplasm of overlapping sites of right breast in female, estrogen receptor positive ([place])   2. Metastatic breast cancer ([place])   3. Encounter for monoclonal antibody treatment for malignancy   4. HER2-positive carcinoma of breast ([place])   5. Encounter for monitoring cardiotoxic drug therapy   6. Thyroid nodule    Recurrent breast cancer with isolated bone metastasis, stage IV #Initially stage IIIA right breast cancer, ER positive, HER-2 negative status post bilateral oophorectomy, Right mastectomy and right axillary lymph node dissection, status post implant, and implant removal.  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 Estrogen positive, HER-2 positive breast cancer.- s/p Palliative radiation to T7  Labs are reviewed and discussed with patient. Recent echo showed LVEF of 50%-55%.  Discussed with her cardiologist Dr.End and we agree upon repeat Echo in 1 month and proceed with current treatment.   Proceed with transtuzumab and pertuzumab.  Fulvestrant monthly Repeat PET in Nov 2022  #Anemia,stable.   Likely secondary to methotrexate.   Hb is stable.   # Hypokalemia, does not tolerate oral potassium.  Potassium is stable. #Treatment induced diarrhea,Continue Lomotil as needed.  Stable symptom  # Bone metastasis, status post radiation. Continue Zometa monthly.  #Back pain,  MRI was reviewed and discussed with patient. T7 lesion has improved.   #Chemotherapy-induced neuropathy, continue Lyrica and nortriptyline #Depression.  Continue Lexapro 20 mg daily. #Rheumatoid arthritis,   Follow up with  rheumatology.  Patient is on methotrexate, recently added plaquenil.  # thyroid nodules -  thyroid US showed left lobe nodules are small no need for follow-up.  Right lobe nodule 1.1 cm, repeat thyroid ultrasound in Aug 2023.   Follow-up 3 weeks for the next cycle of trastuzumab and Pertuzumab treatments. Echo 3-4 weeks.  PET in Nov 2022   Earlie Server, MD, PhD Hematology Oncology [place] [facility] 09/10/21

## 2021-09-16 ENCOUNTER — Inpatient Hospital Stay: Payer: Medicare HMO | Attending: Oncology

## 2021-09-16 ENCOUNTER — Inpatient Hospital Stay: Payer: Medicare HMO

## 2021-09-16 VITALS — BP 99/66 | HR 96 | Temp 96.7°F | Resp 20

## 2021-09-16 DIAGNOSIS — Z170 Estrogen receptor positive status [ER+]: Secondary | ICD-10-CM | POA: Insufficient documentation

## 2021-09-16 DIAGNOSIS — Z5111 Encounter for antineoplastic chemotherapy: Secondary | ICD-10-CM | POA: Insufficient documentation

## 2021-09-16 DIAGNOSIS — M069 Rheumatoid arthritis, unspecified: Secondary | ICD-10-CM | POA: Insufficient documentation

## 2021-09-16 DIAGNOSIS — Z5112 Encounter for antineoplastic immunotherapy: Secondary | ICD-10-CM | POA: Diagnosis present

## 2021-09-16 DIAGNOSIS — E042 Nontoxic multinodular goiter: Secondary | ICD-10-CM | POA: Insufficient documentation

## 2021-09-16 DIAGNOSIS — Z79899 Other long term (current) drug therapy: Secondary | ICD-10-CM | POA: Diagnosis not present

## 2021-09-16 DIAGNOSIS — E876 Hypokalemia: Secondary | ICD-10-CM | POA: Diagnosis not present

## 2021-09-16 DIAGNOSIS — C7951 Secondary malignant neoplasm of bone: Secondary | ICD-10-CM | POA: Diagnosis present

## 2021-09-16 DIAGNOSIS — R197 Diarrhea, unspecified: Secondary | ICD-10-CM | POA: Insufficient documentation

## 2021-09-16 DIAGNOSIS — G620 Drug-induced polyneuropathy: Secondary | ICD-10-CM | POA: Diagnosis not present

## 2021-09-16 DIAGNOSIS — T451X5A Adverse effect of antineoplastic and immunosuppressive drugs, initial encounter: Secondary | ICD-10-CM | POA: Insufficient documentation

## 2021-09-16 DIAGNOSIS — D649 Anemia, unspecified: Secondary | ICD-10-CM | POA: Diagnosis not present

## 2021-09-16 DIAGNOSIS — M549 Dorsalgia, unspecified: Secondary | ICD-10-CM | POA: Insufficient documentation

## 2021-09-16 DIAGNOSIS — C50411 Malignant neoplasm of upper-outer quadrant of right female breast: Secondary | ICD-10-CM | POA: Diagnosis present

## 2021-09-16 DIAGNOSIS — Z9013 Acquired absence of bilateral breasts and nipples: Secondary | ICD-10-CM | POA: Insufficient documentation

## 2021-09-16 DIAGNOSIS — R252 Cramp and spasm: Secondary | ICD-10-CM | POA: Diagnosis not present

## 2021-09-16 LAB — CBC WITH DIFFERENTIAL/PLATELET
Abs Immature Granulocytes: 0.03 10*3/uL (ref 0.00–0.07)
Basophils Absolute: 0 10*3/uL (ref 0.0–0.1)
Basophils Relative: 1 %
Eosinophils Absolute: 0.1 10*3/uL (ref 0.0–0.5)
Eosinophils Relative: 1 %
HCT: 33.7 % — ABNORMAL LOW (ref 36.0–46.0)
Hemoglobin: 11 g/dL — ABNORMAL LOW (ref 12.0–15.0)
Immature Granulocytes: 0 %
Lymphocytes Relative: 21 %
Lymphs Abs: 1.8 10*3/uL (ref 0.7–4.0)
MCH: 29.5 pg (ref 26.0–34.0)
MCHC: 32.6 g/dL (ref 30.0–36.0)
MCV: 90.3 fL (ref 80.0–100.0)
Monocytes Absolute: 0.6 10*3/uL (ref 0.1–1.0)
Monocytes Relative: 8 %
Neutro Abs: 5.9 10*3/uL (ref 1.7–7.7)
Neutrophils Relative %: 69 %
Platelets: 300 10*3/uL (ref 150–400)
RBC: 3.73 MIL/uL — ABNORMAL LOW (ref 3.87–5.11)
RDW: 13.6 % (ref 11.5–15.5)
WBC: 8.5 10*3/uL (ref 4.0–10.5)
nRBC: 0 % (ref 0.0–0.2)

## 2021-09-16 LAB — COMPREHENSIVE METABOLIC PANEL
ALT: 20 U/L (ref 0–44)
AST: 28 U/L (ref 15–41)
Albumin: 4.4 g/dL (ref 3.5–5.0)
Alkaline Phosphatase: 71 U/L (ref 38–126)
Anion gap: 7 (ref 5–15)
BUN: 14 mg/dL (ref 6–20)
CO2: 23 mmol/L (ref 22–32)
Calcium: 8.9 mg/dL (ref 8.9–10.3)
Chloride: 107 mmol/L (ref 98–111)
Creatinine, Ser: 0.83 mg/dL (ref 0.44–1.00)
GFR, Estimated: >60 mL/min (ref >60)
Glucose, Bld: 100 mg/dL — ABNORMAL HIGH (ref 70–99)
Potassium: 3.5 mmol/L (ref 3.5–5.1)
Sodium: 137 mmol/L (ref 135–145)
Total Bilirubin: 0.4 mg/dL (ref 0.3–1.2)
Total Protein: 7.9 g/dL (ref 6.5–8.1)

## 2021-09-16 LAB — MAGNESIUM: Magnesium: 2 mg/dL (ref 1.7–2.4)

## 2021-09-16 MED ORDER — SODIUM CHLORIDE 0.9 % IV SOLN
INTRAVENOUS | Status: DC
  Filled 2021-09-16: qty 250

## 2021-09-16 MED ORDER — FULVESTRANT 250 MG/5ML IM SOLN
500.0000 mg | INTRAMUSCULAR | Status: DC
  Administered 2021-09-16: 500 mg via INTRAMUSCULAR
  Filled 2021-09-16: qty 10

## 2021-09-16 MED ORDER — HEPARIN SOD (PORK) LOCK FLUSH 100 UNIT/ML IV SOLN
500.0000 [IU] | Freq: Once | INTRAVENOUS | Status: AC
  Administered 2021-09-16: 500 [IU] via INTRAVENOUS
  Filled 2021-09-16: qty 5

## 2021-09-16 MED ORDER — ZOLEDRONIC ACID 4 MG/100ML IV SOLN
4.0000 mg | Freq: Once | INTRAVENOUS | Status: AC
  Administered 2021-09-16: 4 mg via INTRAVENOUS
  Filled 2021-09-16: qty 100

## 2021-09-16 NOTE — Patient Instructions (Signed)
Zoledronic Acid Injection (Hypercalcemia, Oncology) What is this medication? ZOLEDRONIC ACID (ZOE le dron ik AS id) slows calcium loss from bones. It high calcium levels in the blood from some kinds of cancer. It may be used in other people at risk for bone loss. This medicine may be used for other purposes; ask your health care provider or pharmacist if you have questions. COMMON BRAND NAME(S): Zometa What should I tell my care team before I take this medication? They need to know if you have any of these conditions: cancer dehydration dental disease kidney disease liver disease low levels of calcium in the blood lung or breathing disease (asthma) receiving steroids like dexamethasone or prednisone an unusual or allergic reaction to zoledronic acid, other medicines, foods, dyes, or preservatives pregnant or trying to get pregnant breast-feeding How should I use this medication? This drug is injected into a vein. It is given by a health care provider in a hospital or clinic setting. Talk to your health care provider about the use of this drug in children. Special care may be needed. Overdosage: If you think you have taken too much of this medicine contact a poison control center or emergency room at once. NOTE: This medicine is only for you. Do not share this medicine with others. What if I miss a dose? Keep appointments for follow-up doses. It is important not to miss your dose. Call your health care provider if you are unable to keep an appointment. What may interact with this medication? certain antibiotics given by injection NSAIDs, medicines for pain and inflammation, like ibuprofen or naproxen some diuretics like bumetanide, furosemide teriparatide thalidomide This list may not describe all possible interactions. Give your health care provider a list of all the medicines, herbs, non-prescription drugs, or dietary supplements you use. Also tell them if you smoke, drink alcohol, or  use illegal drugs. Some items may interact with your medicine. What should I watch for while using this medication? Visit your health care provider for regular checks on your progress. It may be some time before you see the benefit from this drug. Some people who take this drug have severe bone, joint, or muscle pain. This drug may also increase your risk for jaw problems or a broken thigh bone. Tell your health care provider right away if you have severe pain in your jaw, bones, joints, or muscles. Tell you health care provider if you have any pain that does not go away or that gets worse. Tell your dentist and dental surgeon that you are taking this drug. You should not have major dental surgery while on this drug. See your dentist to have a dental exam and fix any dental problems before starting this drug. Take good care of your teeth while on this drug. Make sure you see your dentist for regular follow-up appointments. You should make sure you get enough calcium and vitamin D while you are taking this drug. Discuss the foods you eat and the vitamins you take with your health care provider. Check with your health care provider if you have severe diarrhea, nausea, and vomiting, or if you sweat a lot. The loss of too much body fluid may make it dangerous for you to take this drug. You may need blood work done while you are taking this drug. Do not become pregnant while taking this drug. Women should inform their health care provider if they wish to become pregnant or think they might be pregnant. There is potential for serious  harm to an unborn child. Talk to your health care provider for more information. What side effects may I notice from receiving this medication? Side effects that you should report to your doctor or health care provider as soon as possible: allergic reactions (skin rash, itching or hives; swelling of the face, lips, or tongue) bone pain infection (fever, chills, cough, sore  throat, pain or trouble passing urine) jaw pain, especially after dental work joint pain kidney injury (trouble passing urine or change in the amount of urine) low blood pressure (dizziness; feeling faint or lightheaded, falls; unusually weak or tired) low calcium levels (fast heartbeat; muscle cramps or pain; pain, tingling, or numbness in the hands or feet; seizures) low magnesium levels (fast, irregular heartbeat; muscle cramp or pain; muscle weakness; tremors; seizures) low red blood cell counts (trouble breathing; feeling faint; lightheaded, falls; unusually weak or tired) muscle pain redness, blistering, peeling, or loosening of the skin, including inside the mouth severe diarrhea swelling of the ankles, feet, hands trouble breathing Side effects that usually do not require medical attention (report to your doctor or health care provider if they continue or are bothersome): anxious constipation coughing depressed mood eye irritation, itching, or pain fever general ill feeling or flu-like symptoms nausea pain, redness, or irritation at site where injected trouble sleeping This list may not describe all possible side effects. Call your doctor for medical advice about side effects. You may report side effects to FDA at 1-800-FDA-1088. Where should I keep my medication? This drug is given in a hospital or clinic. It will not be stored at home. NOTE: This sheet is a summary. It may not cover all possible information. If you have questions about this medicine, talk to your doctor, pharmacist, or health care provider.  2022 Elsevier/Gold Standard (2019-09-12 09:13:00) Fulvestrant injection What is this medication? FULVESTRANT (ful VES trant) blocks the effects of estrogen. It is used to treat breast cancer. This medicine may be used for other purposes; ask your health care provider or pharmacist if you have questions. COMMON BRAND NAME(S): FASLODEX What should I tell my care team  before I take this medication? They need to know if you have any of these conditions: bleeding disorders liver disease low blood counts, like low white cell, platelet, or red cell counts an unusual or allergic reaction to fulvestrant, other medicines, foods, dyes, or preservatives pregnant or trying to get pregnant breast-feeding How should I use this medication? This medicine is for injection into a muscle. It is usually given by a health care professional in a hospital or clinic setting. Talk to your pediatrician regarding the use of this medicine in children. Special care may be needed. Overdosage: If you think you have taken too much of this medicine contact a poison control center or emergency room at once. NOTE: This medicine is only for you. Do not share this medicine with others. What if I miss a dose? It is important not to miss your dose. Call your doctor or health care professional if you are unable to keep an appointment. What may interact with this medication? medicines that treat or prevent blood clots like warfarin, enoxaparin, dalteparin, apixaban, dabigatran, and rivaroxaban This list may not describe all possible interactions. Give your health care provider a list of all the medicines, herbs, non-prescription drugs, or dietary supplements you use. Also tell them if you smoke, drink alcohol, or use illegal drugs. Some items may interact with your medicine. What should I watch for while using this  medication? Your condition will be monitored carefully while you are receiving this medicine. You will need important blood work done while you are taking this medicine. Do not become pregnant while taking this medicine or for at least 1 year after stopping it. Women of child-bearing potential will need to have a negative pregnancy test before starting this medicine. Women should inform their doctor if they wish to become pregnant or think they might be pregnant. There is a potential for  serious side effects to an unborn child. Men should inform their doctors if they wish to father a child. This medicine may lower sperm counts. Talk to your health care professional or pharmacist for more information. Do not breast-feed an infant while taking this medicine or for 1 year after the last dose. What side effects may I notice from receiving this medication? Side effects that you should report to your doctor or health care professional as soon as possible: allergic reactions like skin rash, itching or hives, swelling of the face, lips, or tongue feeling faint or lightheaded, falls pain, tingling, numbness, or weakness in the legs signs and symptoms of infection like fever or chills; cough; flu-like symptoms; sore throat vaginal bleeding Side effects that usually do not require medical attention (report to your doctor or health care professional if they continue or are bothersome): aches, pains constipation diarrhea headache hot flashes nausea, vomiting pain at site where injected stomach pain This list may not describe all possible side effects. Call your doctor for medical advice about side effects. You may report side effects to FDA at 1-800-FDA-1088. Where should I keep my medication? This drug is given in a hospital or clinic and will not be stored at home. NOTE: This sheet is a summary. It may not cover all possible information. If you have questions about this medicine, talk to your doctor, pharmacist, or health care provider.  2022 Elsevier/Gold Standard (2018-03-08 11:34:41)

## 2021-09-28 ENCOUNTER — Ambulatory Visit
Admission: RE | Admit: 2021-09-28 | Discharge: 2021-09-28 | Disposition: A | Discharge status: Home / Self Care | Payer: Medicare HMO | Source: Ambulatory Visit | Attending: Oncology | Admitting: Oncology

## 2021-09-28 ENCOUNTER — Other Ambulatory Visit: Payer: Self-pay

## 2021-09-28 ENCOUNTER — Other Ambulatory Visit: Payer: Self-pay | Admitting: *Deleted

## 2021-09-28 DIAGNOSIS — C50811 Malignant neoplasm of overlapping sites of right female breast: Secondary | ICD-10-CM

## 2021-09-28 DIAGNOSIS — Z0189 Encounter for other specified special examinations: Secondary | ICD-10-CM

## 2021-09-28 DIAGNOSIS — Z01810 Encounter for preprocedural cardiovascular examination: Secondary | ICD-10-CM | POA: Diagnosis present

## 2021-09-28 DIAGNOSIS — I340 Nonrheumatic mitral (valve) insufficiency: Secondary | ICD-10-CM | POA: Diagnosis not present

## 2021-09-28 DIAGNOSIS — Z170 Estrogen receptor positive status [ER+]: Secondary | ICD-10-CM | POA: Diagnosis not present

## 2021-09-28 LAB — ECHOCARDIOGRAM COMPLETE
AR max vel: 2.35 cm2
AV Area VTI: 2.48 cm2
AV Area mean vel: 2.6 cm2
AV Mean grad: 3 mmHg
AV Peak grad: 5.5 mmHg
Ao pk vel: 1.17 m/s
Area-P 1/2: 5.5 cm2
Calc EF: 25.2 %
MV VTI: 3.1 cm2
S' Lateral: 3.89 cm
Single Plane A2C EF: 24.1 %
Single Plane A4C EF: 27.8 %

## 2021-09-28 MED ORDER — PERFLUTREN LIPID MICROSPHERE
1.0000 mL | INTRAVENOUS | Status: AC | PRN
  Administered 2021-09-28: 2 mL via INTRAVENOUS
  Filled 2021-09-28: qty 10

## 2021-09-28 MED ORDER — OXYCODONE HCL 5 MG PO TABS: 5.0000 mg | ORAL_TABLET | Freq: Four times a day (QID) | ORAL | 0 refills | Status: DC | PRN

## 2021-09-28 NOTE — Progress Notes (Signed)
*  PRELIMINARY RESULTS* Echocardiogram 2D Echocardiogram has been performed.  Wallie Char Boysie Bonebrake 09/28/2021, 11:01 AM

## 2021-09-29 ENCOUNTER — Telehealth: Payer: Self-pay | Admitting: *Deleted

## 2021-09-29 ENCOUNTER — Telehealth: Payer: Self-pay

## 2021-09-29 NOTE — Telephone Encounter (Signed)
-----   Message from Nelva Bush, MD sent at 09/29/2021  2:36 PM EDT ----- Regarding: Abnormal echo Hello,  Ms. Sweezy had an echo ordered by oncology performed yesterday that showed slight interval reduction in her LVEF.  Could you arrange for her to see me or an APP at her earliest convenience to discuss medical therapy of her cardiomyopathy?  Thanks.  Gerald Stabs

## 2021-09-29 NOTE — Telephone Encounter (Signed)
Patient notified via mychart    Per Dr. Tasia Catchings..Marland KitchenMarland KitchenPlease cancel patient  trastuzumab treatment appointment on 10/21. Thank

## 2021-10-01 ENCOUNTER — Inpatient Hospital Stay: Payer: Medicare HMO

## 2021-10-01 ENCOUNTER — Inpatient Hospital Stay (HOSPITAL_BASED_OUTPATIENT_CLINIC_OR_DEPARTMENT_OTHER): Payer: Medicare HMO | Admitting: Oncology

## 2021-10-01 ENCOUNTER — Other Ambulatory Visit: Payer: Self-pay

## 2021-10-01 ENCOUNTER — Encounter: Payer: Self-pay | Admitting: Oncology

## 2021-10-01 VITALS — BP 110/71 | HR 101 | Temp 97.4°F | Resp 16 | Wt 186.7 lb

## 2021-10-01 DIAGNOSIS — C50919 Malignant neoplasm of unspecified site of unspecified female breast: Secondary | ICD-10-CM | POA: Diagnosis not present

## 2021-10-01 DIAGNOSIS — E041 Nontoxic single thyroid nodule: Secondary | ICD-10-CM

## 2021-10-01 DIAGNOSIS — Z5111 Encounter for antineoplastic chemotherapy: Secondary | ICD-10-CM | POA: Diagnosis not present

## 2021-10-01 LAB — CBC WITH DIFFERENTIAL/PLATELET
Abs Immature Granulocytes: 0.03 10*3/uL (ref 0.00–0.07)
Basophils Absolute: 0 10*3/uL (ref 0.0–0.1)
Basophils Relative: 0 %
Eosinophils Absolute: 0.1 10*3/uL (ref 0.0–0.5)
Eosinophils Relative: 1 %
HCT: 32.5 % — ABNORMAL LOW (ref 36.0–46.0)
Hemoglobin: 10.8 g/dL — ABNORMAL LOW (ref 12.0–15.0)
Immature Granulocytes: 0 %
Lymphocytes Relative: 22 %
Lymphs Abs: 2.1 10*3/uL (ref 0.7–4.0)
MCH: 30 pg (ref 26.0–34.0)
MCHC: 33.2 g/dL (ref 30.0–36.0)
MCV: 90.3 fL (ref 80.0–100.0)
Monocytes Absolute: 0.6 10*3/uL (ref 0.1–1.0)
Monocytes Relative: 7 %
Neutro Abs: 6.4 10*3/uL (ref 1.7–7.7)
Neutrophils Relative %: 70 %
Platelets: 270 10*3/uL (ref 150–400)
RBC: 3.6 MIL/uL — ABNORMAL LOW (ref 3.87–5.11)
RDW: 13.2 % (ref 11.5–15.5)
WBC: 9.2 10*3/uL (ref 4.0–10.5)
nRBC: 0 % (ref 0.0–0.2)

## 2021-10-01 LAB — COMPREHENSIVE METABOLIC PANEL
ALT: 19 U/L (ref 0–44)
AST: 25 U/L (ref 15–41)
Albumin: 4.2 g/dL (ref 3.5–5.0)
Alkaline Phosphatase: 64 U/L (ref 38–126)
Anion gap: 9 (ref 5–15)
BUN: 17 mg/dL (ref 6–20)
CO2: 23 mmol/L (ref 22–32)
Calcium: 8.7 mg/dL — ABNORMAL LOW (ref 8.9–10.3)
Chloride: 104 mmol/L (ref 98–111)
Creatinine, Ser: 0.86 mg/dL (ref 0.44–1.00)
GFR, Estimated: >60 mL/min (ref >60)
Glucose, Bld: 128 mg/dL — ABNORMAL HIGH (ref 70–99)
Potassium: 3.4 mmol/L — ABNORMAL LOW (ref 3.5–5.1)
Sodium: 136 mmol/L (ref 135–145)
Total Bilirubin: 0.6 mg/dL (ref 0.3–1.2)
Total Protein: 7.3 g/dL (ref 6.5–8.1)

## 2021-10-01 LAB — MAGNESIUM: Magnesium: 2 mg/dL (ref 1.7–2.4)

## 2021-10-01 MED ORDER — SODIUM CHLORIDE 0.9% FLUSH
10.0000 mL | Freq: Once | INTRAVENOUS | Status: AC
Start: 2021-10-01 — End: 2021-10-01
  Administered 2021-10-01: 10 mL via INTRAVENOUS
  Filled 2021-10-01: qty 10

## 2021-10-01 MED ORDER — HEPARIN SOD (PORK) LOCK FLUSH 100 UNIT/ML IV SOLN
500.0000 [IU] | Freq: Once | INTRAVENOUS | Status: AC
  Administered 2021-10-01: 500 [IU] via INTRAVENOUS
  Filled 2021-10-01: qty 5

## 2021-10-01 NOTE — Progress Notes (Signed)
Patient has low back back 8/10 on pain scale for the past week.

## 2021-10-01 NOTE — Progress Notes (Signed)
Hematology/Oncology Follow Up Note [facility] [phone] [phone]) [phone]  Patient Care Team: Marguerita Merles, MD as PCP - General (Family Medicine) End, Harrell Gave, MD as PCP - Cardiology (Cardiology) Rico Junker, RN as Oncology Nurse Navigator Earlie Server, MD as Consulting Physician (Oncology) Bary Castilla, Forest Gleason, MD (General Surgery) Noreene Filbert, MD as Referring Physician (Radiation Oncology) Noreene Filbert, MD as Radiation Oncologist (Radiation Oncology) Noreene Filbert, MD as Radiation Oncologist (Radiation Oncology)   Name of the patient: Theresa Chaney  [id number]  [birth year]   Date of visit: 10/01/21 REASON FOR VISIT Follow up for  treatment of breast cancer  Oncology History Cancer TREATMENT Neoadjuvant ddAC +one dose of Taxol, due to lack of response, surgery was offered. Case was discussed on breast tumor board. 03/19/2018 S/p right mastectomy and right axillary dissection, immediate breast reconstruction with placement of expanders.  ypT3 ypN2, + lymphovascular invasion,  Grade 3, margin is negative, close. ER 90%, PR 0%, HER2 IHC negative.  Also had elective bilateral salpingo-oophorectomy..  # 06/11/2018 s/p 11 cycles Taxol adjuvantly. Tolerated well.  # She has obtained dental clearance for starting Zometa.  S/p Zometa on 6/3/ 2019 # s/p adjuvant right chest wall radiation, finished 10/10/2018 #06/03/2019 underwent elective left prophylactic mastectomy and sentinel lymph node biopsy of left axilla.  # 06/03/2019 underwent elective left prophylactic mastectomy and sentinel lymph node biopsy of left axilla. Pathology negative for malignancy. Status post Mediport removal on 06/03/2019. She also underwent right implant removal on 06/03/2019. She also underwent right implant removal on 06/03/2019. #Negative genetic testing  #  chemotherapy-induced neuropathy, bilateral fingertips and lower extremities.  Patient is on Lyrica and  nortriptyline.  Follows up with neurology. She follows up with lymphedema clinic.  #  07/07/2020, MRI thoracic spine without contrast showed lesions involving the T7 posterior elements most concerning for metastatic lesion.  No evidence of epidural tumor.  Minimal thoracic spondylosis without stenosis. MRI was reviewed by me and a PET scan was obtained for further evaluation. 07/20/2020, PET scan showed hypermetabolic metastasis involving the posterior element of T7, no additional evidence of metastasis in the neck, chest, abdomen or pelvis.  # 07/29/2020 T7 lesion biopsy showed metastatic carcinoma, compatible with breast origin.  Receptor status staining showed ER 71-80% positive, PR negative, HER-2 positive IHC 3+ # Patient finished spine radiation on 08/31/2020   positive for COVID-19 on 01/11/2021  # 03/15/2021, PET scan showed no focal hypermetabolic activity to suggest skeletal metastasis.  Mild hypermetabolic activity along the right T7-8 paraspinal musculature.  Max SUV 3.3.  Likely postprocedural-  #Lower extremity cramps when she eats banana or oral potassium supplementation.not able to tolerate po potassium.   INTERVAL HISTORY 40 yo female with above oncology history reviewed by me presents for follow-up of management of metastatic breast cancer.  Chronic intermittent back pain, patient reports that her back pain has worsened recently.  She took ibuprofen with no relief.  Started taking oxycodone with some relief.   Review of Systems  Constitutional:  Negative for chills, fever, malaise/fatigue and weight loss.  HENT:  Negative for sore throat.   Eyes:  Negative for redness.  Respiratory:  Negative for cough, shortness of breath and wheezing.   Cardiovascular:  Negative for chest pain, palpitations and leg swelling.  Gastrointestinal:  Negative for abdominal pain, blood in stool, heartburn, nausea and vomiting.  Genitourinary:  Negative for dysuria.  Musculoskeletal:  Positive for  back pain and joint pain. Negative for myalgias.  Skin:  Negative for rash.  Neurological:  Positive for tingling. Negative for dizziness and tremors.  Endo/Heme/Allergies:  Does not bruise/bleed easily.  Psychiatric/Behavioral:  Negative for hallucinations.    No Known Allergies  Patient Active Problem List   Diagnosis Date Noted   Tachycardia, unspecified 04/15/2021   Bone metastasis ([place]) 03/25/2021   Encounter for monoclonal antibody treatment for malignancy 12/10/2020   Bone lesion 11/19/2020   Hypocalcemia 11/19/2020   Inflammatory arthritis 11/19/2020   Encounter for antineoplastic chemotherapy 10/29/2020   Metastatic breast cancer ([place]) 08/31/2020   Goals of care, counseling/discussion 08/11/2020   Neuropathy due to chemotherapeutic drug ([place]) 08/11/2020   HER2-positive carcinoma of breast ([place]) 08/11/2020   Rheumatoid arteritis ([place]) 10/13/2019   Bilateral hand swelling 10/03/2019   Fracture of neck of metacarpal bone 05/14/2019   Chronic fatigue 04/09/2019   Polyarthralgia 04/09/2019   Status post right breast reconstruction 02/26/2019   Status post right mastectomy 02/26/2019   Mastalgia 02/15/2019   Shortness of breath 08/23/2018   Nonischemic cardiomyopathy ([place]) 08/23/2018   Preprocedural cardiovascular examination 08/23/2018   Tachycardia 08/23/2018   Palpitations 08/23/2018   Estrogen receptor positive status (ER+) 04/04/2018   Acquired absence of right breast and nipple 04/03/2018   Breast cancer of upper-outer quadrant of right female breast ([place]) 03/19/2018   Family history of cancer    Malignant neoplasm of overlapping sites of right breast in female, estrogen receptor positive ([place]) 10/19/2017   Gastroesophageal reflux disease without esophagitis 02/24/2017   Generalized anxiety disorder 10/03/2014   Headache 10/03/2014     Past Medical History:  Diagnosis Date   Anemia    Arthritis    BRCA negative 11/26/2017   Breast cancer ([place]) 10/11/2017    Multifocal, ER positive, PR negative, HER-2 negative. ypT3 ypN2a 8.7 cm, 4/15 nodes   Cardiomyopathy ([place])    a. 10/2017 Echo: EF 60-65%, no rwma, Gr1 DD, nl RV size/fxn; b. 04/2018 Echo: EF 55-60%, no rwma, Nl RV size/fxn; c. 08/2018 Echo: EF 45%, diff HK, ? HK of antsept wall. Gr1 DD. Mild MR. Mild LAE/RAE. Mod dil RV.    Chronic bronchitis ([place]) 11/2017   COPD (chronic obstructive pulmonary disease) ([facility])    MILD PER CXR   Depression    Family history of cancer    GERD (gastroesophageal reflux disease)    Headache    MIGRAINES   Heart murmur    ASYMPTOMATIC   Personal history of chemotherapy    current for right breast ca     Past Surgical History:  Procedure Laterality Date   AXILLARY LYMPH NODE DISSECTION Right 03/19/2018   Procedure: AXILLARY LYMPH NODE DISSECTION;  Surgeon: Robert Bellow, MD;  Location: [facility];  Service: General;  Laterality: Right;   BREAST BIOPSY Right 10/11/2017   12:30 posterior coil clip invasive mammary carcinoma   BREAST BIOPSY Right 10/11/2017   11:30 middle depth ribbon clip DCIS   BREAST BIOPSY Right 10/11/2017   5:30 anterior depth x shape invasive ductal carcinoma   BREAST IMPLANT REMOVAL Right 06/03/2019   Procedure: REMOVAL OF RIGHT BREAST IMPLANTS;  Surgeon: Wallace Going, DO;  Location: [facility];  Service: Plastics;  Laterality: Right;   BREAST RECONSTRUCTION WITH PLACEMENT OF TISSUE EXPANDER AND FLEX HD (ACELLULAR HYDRATED DERMIS) Right 03/19/2018   Procedure: BREAST RECONSTRUCTION WITH PLACEMENT OF TISSUE EXPANDER AND FLEX HD (ACELLULAR HYDRATED DERMIS);  Surgeon: Wallace Going, DO;  Location: [facility];  Service: Plastics;  Laterality: Right;   CARPAL TUNNEL RELEASE Bilateral  2020   CHOLECYSTECTOMY N/A 04/27/2020   Procedure: LAPAROSCOPIC CHOLECYSTECTOMY WITH INTRAOPERATIVE CHOLANGIOGRAM;  Surgeon: Robert Bellow, MD;  Location: [facility];  Service: General;  Laterality: N/A;   ESOPHAGOGASTRODUODENOSCOPY (EGD) WITH PROPOFOL  N/A 04/17/2020   Procedure: ESOPHAGOGASTRODUODENOSCOPY (EGD) WITH PROPOFOL;  Surgeon: Robert Bellow, MD;  Location: [facility];  Service: Endoscopy;  Laterality: N/A;  with biopsy   LAPAROSCOPIC BILATERAL SALPINGO OOPHERECTOMY Bilateral 03/19/2018   Procedure: LAPAROSCOPIC BILATERAL SALPINGO OOPHORECTOMY;  Surgeon: Benjaman Kindler, MD;  Location: [facility];  Service: Gynecology;  Laterality: Bilateral;   MASTECTOMY Right 03/2018   MASTECTOMY W/ SENTINEL NODE BIOPSY Right 03/19/2018   Procedure: MASTECTOMY WITH SENTINEL LYMPH NODE BIOPSY;  Surgeon: Robert Bellow, MD;  Location: [facility];  Service: General;  Laterality: Right;   PORT-A-CATH REMOVAL Left 06/03/2019   Procedure: REMOVAL PORT-A-CATH;  Surgeon: Robert Bellow, MD;  Location: [facility];  Service: General;  Laterality: Left;   PORTACATH PLACEMENT Left 10/24/2017   Procedure: INSERTION PORT-A-CATH;  Surgeon: Robert Bellow, MD;  Location: [facility];  Service: General;  Laterality: Left;   PORTACATH PLACEMENT Right 09/28/2020   Procedure: INSERTION PORT-A-CATH;  Surgeon: Robert Bellow, MD;  Location: [facility];  Service: General;  Laterality: Right;   REMOVAL OF TISSUE EXPANDER AND PLACEMENT OF IMPLANT Right 07/20/2018   Procedure: REMOVAL OF RIGHT BREAST TISSUE EXPANDER AND PLACEMENT OF IMPLANT;  Surgeon: Wallace Going, DO;  Location: [facility];  Service: Plastics;  Laterality: Right;   SIMPLE MASTECTOMY WITH AXILLARY SENTINEL NODE BIOPSY Left 06/03/2019   Procedure: SIMPLE MASTECTOMY LEFT;  Surgeon: Robert Bellow, MD;  Location: [facility];  Service: General;  Laterality: Left;    Social History   Socioeconomic History   Marital status: Married    Spouse name: Not on file   Number of children: Not on file   Years of education: Not on file   Highest education level: Not on file  Occupational History   Occupation: pharmacy tech    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 [employer]   Tobacco  Use   Smoking status: Every Day    Packs/day: 0.50    Years: 18.00    Pack years: 9.00    Types: Cigarettes    Start date: 06/21/2018   Smokeless tobacco: Never  Vaping Use   Vaping Use: Never used  Substance and Sexual Activity   Alcohol use: No   Drug use: No   Sexual activity: Yes    Birth control/protection: Injection, Other-see comments    Comment: has had hysterectomy  Other Topics Concern   Not on file  Social History Narrative   Lives at home with husband and daughter   Social Determinants of Health   Financial Resource Strain: Not on file  Food Insecurity: Not on file  Transportation Needs: Not on file  Physical Activity: Not on file  Stress: Not on file  Social Connections: Not on file  Intimate Partner Violence: Not on file     Family History  Problem Relation Age of Onset   Melanoma Maternal Aunt        other aunts with BCC/SCC/Melanoma   Diabetes Father    Hypertension Father    Hyperlipidemia Father    Heart attack Father 7       "mild"   Bladder Cancer Maternal Grandmother    Cervical cancer Maternal Aunt 64       daughter w/ cervical cancer as well   Melanoma Maternal Uncle  other uncles with BCC/SCC/Melanoma  Biological mother had Grave's disease.    Current Outpatient Medications:    acetaminophen (TYLENOL) 500 MG tablet, Take 500 mg by mouth every 6 (six) hours as needed., Disp: , Rfl:    albuterol (VENTOLIN HFA) 108 (90 Base) MCG/ACT inhaler, Inhale 2 puffs into the lungs every 6 (six) hours as needed for wheezing or shortness of breath. , Disp: , Rfl:    Calcium Carbonate-Vitamin D 600-400 MG-UNIT tablet, Take 3 tablets by mouth daily., Disp: , Rfl:    cholecalciferol (VITAMIN D3) 25 MCG (1000 UNIT) tablet, Take 1 tablet (1,000 Units total) by mouth daily., Disp: 90 tablet, Rfl: 0   cyanocobalamin (,VITAMIN B-12,) 1000 MCG/ML injection, Inject 1,000 mcg into the skin every 30 (thirty) days., Disp: , Rfl:    diphenoxylate-atropine  (LOMOTIL) 2.5-0.025 MG tablet, Take 1 tablet by mouth 4 (four) times daily as needed for diarrhea or loose stools., Disp: 60 tablet, Rfl: 0   escitalopram (LEXAPRO) 20 MG tablet, Take 20 mg by mouth daily., Disp: , Rfl:    esomeprazole (NEXIUM) 40 MG capsule, Take 40 mg by mouth daily before breakfast. , Disp: , Rfl:    Eszopiclone 3 MG TABS, Take 3 mg by mouth at bedtime as needed (sleep). , Disp: , Rfl:    folic acid (FOLVITE) 1 MG tablet, Take 1 mg by mouth daily., Disp: , Rfl:    hydroxychloroquine (PLAQUENIL) 200 MG tablet, Take 200 mg by mouth daily., Disp: , Rfl:    ibuprofen (ADVIL) 800 MG tablet, Take 800 mg by mouth every 8 (eight) hours as needed for moderate pain., Disp: , Rfl:    lidocaine-prilocaine (EMLA) cream, Apply to port and cover 1-2 hours prior to appointment, Disp: 30 g, Rfl: 3   loperamide (IMODIUM) 2 MG capsule, Take 2 tablets with onset of diarrhea, then take 1 tablet every 2 hours until diarrhea stops. Maximum 8 tablets in 24hours, Disp: 30 capsule, Rfl: 3   loratadine (CLARITIN) 10 MG tablet, Take 10 mg by mouth daily. , Disp: , Rfl:    LORazepam (ATIVAN) 1 MG tablet, Take 1 mg by mouth 3 (three) times daily., Disp: , Rfl:    Magnesium 400 MG TABS, Take 400 mg by mouth 2 (two) times daily., Disp: , Rfl:    methotrexate (RHEUMATREX) 2.5 MG tablet, Take 25 mg by mouth every Sunday. 10 tablets once a week, Disp: , Rfl:    metoprolol succinate (TOPROL-XL) 25 MG 24 hr tablet, Take 37.5 mg by mouth every morning. , Disp: , Rfl:    mupirocin ointment (BACTROBAN) 2 %, Place 1 application into the nose 2 (two) times daily. Use in each nostril twice daily for five (5) days., Disp: 22 g, Rfl: 5   nortriptyline (PAMELOR) 10 MG capsule, Take 30 mg by mouth at bedtime. , Disp: , Rfl:    oxyCODONE (OXY IR/ROXICODONE) 5 MG immediate release tablet, Take 1 tablet (5 mg total) by mouth every 6 (six) hours as needed for moderate pain or severe pain., Disp: 90 tablet, Rfl: 0   phentermine  (ADIPEX-P) 37.5 MG tablet, Take 37.5 mg by mouth daily before breakfast. , Disp: , Rfl:    pregabalin (LYRICA) 150 MG capsule, Take 150 mg by mouth 2 (two) times daily., Disp: , Rfl:    promethazine (PHENERGAN) 25 MG tablet, Take 1 tablet (25 mg total) by mouth every 8 (eight) hours as needed for nausea or vomiting., Disp: 90 tablet, Rfl: 0   pyridOXINE (VITAMIN  B-6) 100 MG tablet, Take 100 mg by mouth daily., Disp: , Rfl:    topiramate (TOPAMAX) 50 MG tablet, Take 50 mg by mouth 2 (two) times daily. , Disp: , Rfl:    vitamin C (ASCORBIC ACID) 500 MG tablet, Take 500 mg by mouth daily., Disp: , Rfl:    cyclobenzaprine (FLEXERIL) 5 MG tablet, Take 5 mg by mouth 3 (three) times daily as needed for muscle spasms.  (Patient not taking: Reported on 10/01/2021), Disp: , Rfl:  No current facility-administered medications for this visit.  Facility-Administered Medications Ordered in Other Visits:    heparin lock flush 100 unit/mL, 500 Units, Intravenous, Once, Earlie Server, MD   sodium chloride flush (NS) 0.9 % injection 10 mL, 10 mL, Intravenous, PRN, Earlie Server, MD, 10 mL at 11/19/18 1249   sodium chloride flush (NS) 0.9 % injection 10 mL, 10 mL, Intravenous, Once, Earlie Server, MD   Physical exam:  Vitals:   10/01/21 0840  BP: 110/71  Pulse: (!) 101  Resp: 16  Temp: (!) 97.4 F (36.3 C)  Weight: 186 lb 11.2 oz (84.7 kg)  ECOG 1 Physical Exam Constitutional:      General: She is not in acute distress.    Appearance: She is not diaphoretic.  HENT:     Head: Normocephalic and atraumatic.     Nose: Nose normal.     Mouth/Throat:     Pharynx: No oropharyngeal exudate.  Eyes:     General: No scleral icterus.    Pupils: Pupils are equal, round, and reactive to light.  Neck:     Vascular: No JVD.  Cardiovascular:     Rate and Rhythm: Normal rate and regular rhythm.     Heart sounds: Normal heart sounds. No murmur heard. Pulmonary:     Effort: Pulmonary effort is normal. No respiratory distress.      Breath sounds: Normal breath sounds. No wheezing or rales.  Chest:     Chest wall: No tenderness.  Abdominal:     General: Bowel sounds are normal. There is no distension.     Palpations: Abdomen is soft. There is no mass.     Tenderness: There is no abdominal tenderness. There is no rebound.  Musculoskeletal:        General: Normal range of motion.     Cervical back: Normal range of motion and neck supple.  Lymphadenopathy:     Cervical: No cervical adenopathy.  Skin:    General: Skin is warm and dry.     Findings: No erythema or rash.  Neurological:     Mental Status: She is alert and oriented to person, place, and time.     Cranial Nerves: No cranial nerve deficit.     Motor: No abnormal muscle tone.     Coordination: Coordination normal.  Psychiatric:        Mood and Affect: Affect normal.       Labs  CMP Latest Ref Rng & Units 10/01/2021  Glucose 70 - 99 mg/dL 128(H)  BUN 6 - 20 mg/dL 17  Creatinine 0.44 - 1.00 mg/dL 0.86  Sodium 135 - 145 mmol/L 136  Potassium 3.5 - 5.1 mmol/L 3.4(L)  Chloride 98 - 111 mmol/L 104  CO2 22 - 32 mmol/L 23  Calcium 8.9 - 10.3 mg/dL 8.7(L)  Total Protein 6.5 - 8.1 g/dL 7.3  Total Bilirubin 0.3 - 1.2 mg/dL 0.6  Alkaline Phos 38 - 126 U/L 64  AST 15 - 41 U/L 25  ALT 0 - 44 U/L 19   CBC Latest Ref Rng & Units 10/01/2021  WBC 4.0 - 10.5 K/uL 9.2  Hemoglobin 12.0 - 15.0 g/dL 10.8(L)  Hematocrit 36.0 - 46.0 % 32.5(L)  Platelets 150 - 400 K/uL 270   RADIOGRAPHIC STUDIES: I have personally reviewed the radiological images as listed and agreed with the findings in the report. MR Thoracic Spine W Wo Contrast  Result Date: 07/20/2021 CLINICAL DATA:  Metastatic breast cancer. Back pain. Bone metastasis. EXAM: MRI THORACIC WITHOUT AND WITH CONTRAST TECHNIQUE: Multiplanar and multiecho pulse sequences of the thoracic spine were obtained without and with intravenous contrast. CONTRAST:  7.42m GADAVIST GADOBUTROL 1 MMOL/ML IV SOLN  COMPARISON:  MRI of the thoracic spine July 07, 2020. FINDINGS: Alignment:  Physiologic. Vertebrae: Interval resolution of the increased T2 signal and contrast enhancement in the posterior elements of T7. No flow new focus of abnormal contrast enhancement identified. No fracture, evidence of discitis, or aggressive bone lesion. Cord:  Normal signal and morphology. Paraspinal and other soft tissues: 8 mm right thyroid lobe nodule. Paraspinal soft tissues are unremarkable. Disc levels: Again seen is ossification of the ligamentum flavum at C6-7. No significant disc herniation, spinal canal or neural foraminal stenosis at any level. IMPRESSION: 1. Interval resolution of the increased T2 signal contrast enhancement in the posterior elements of T7. No new lesion identified. 2. No spinal canal or neural foraminal stenosis. Electronically Signed   By: KPedro EarlsM.D.   On: 07/20/2021 09:40   ECHOCARDIOGRAM COMPLETE  Result Date: 09/28/2021    ECHOCARDIOGRAM REPORT   Patient Name:   DSHIRAH ROSEMANDate of Exam: 09/28/2021 Medical Rec #:  [id number]     Height:       67.0 in Accession #:    [id number]    Weight:       184.2 lb Date of Birth:  2Feb 02, 1982     BSA:          1.953 m Patient Age:    89years       BP:           99/66 mmHg Patient Gender: F              HR:           81 bpm. Exam Location:  [facility] Procedure: 2D Echo, Color Doppler, Cardiac Doppler and Intracardiac            Opacification Agent Indications:     Z09 Chemo  History:         Patient has prior history of Echocardiogram examinations, most                  recent 08/31/2021. Malignant neoplasm of overlapping sites of                  right breast in female, estrogen receptor positive.  Sonographer:     JCharmayne SheerReferring Phys:  [id number] Jaksen Fiorella Diagnosing Phys: CNelva BushMD  Sonographer Comments: Suboptimal apical window. Global longitudinal strain was attempted. IMPRESSIONS  1. Left ventricular ejection fraction, by  estimation, is 45%. The left ventricle has mild to moderately decreased function. The left ventricle demonstrates global hypokinesis. Left ventricular diastolic parameters were normal. The average left ventricular global longitudinal strain is -15.0 %. The global longitudinal strain is abnormal.  2. Right ventricular systolic function is normal. The right ventricular size is normal. Tricuspid regurgitation signal is inadequate for assessing  PA pressure.  3. The mitral valve is normal in structure. Trivial mitral valve regurgitation. No evidence of mitral stenosis.  4. The aortic valve is tricuspid. Aortic valve regurgitation is not visualized. No aortic stenosis is present. Comparison(s): A prior study was performed on 08/31/2021. The left ventricular function is slightly worse. FINDINGS  Left Ventricle: Left ventricular ejection fraction, by estimation, is 45%. The left ventricle has mild to moderately decreased function. The left ventricle demonstrates global hypokinesis. Definity contrast agent was given IV to delineate the left ventricular endocardial borders. The average left ventricular global longitudinal strain is -15.0 %. The global longitudinal strain is abnormal. The left ventricular internal cavity size was normal in size. There is no left ventricular hypertrophy. Left ventricular diastolic parameters were normal. Right Ventricle: The right ventricular size is normal. No increase in right ventricular wall thickness. Right ventricular systolic function is normal. Tricuspid regurgitation signal is inadequate for assessing PA pressure. Left Atrium: Left atrial size was normal in size. Right Atrium: Right atrial size was normal in size. Pericardium: There is no evidence of pericardial effusion. Mitral Valve: The mitral valve is normal in structure. Trivial mitral valve regurgitation. No evidence of mitral valve stenosis. MV peak gradient, 3.3 mmHg. The mean mitral valve gradient is 2.0 mmHg. Tricuspid Valve:  The tricuspid valve is normal in structure. Tricuspid valve regurgitation is trivial. Aortic Valve: The aortic valve is tricuspid. Aortic valve regurgitation is not visualized. No aortic stenosis is present. Aortic valve mean gradient measures 3.0 mmHg. Aortic valve peak gradient measures 5.5 mmHg. Aortic valve area, by VTI measures 2.48 cm. Pulmonic Valve: The pulmonic valve was not well visualized. Pulmonic valve regurgitation is not visualized. No evidence of pulmonic stenosis. Aorta: The aortic root is normal in size and structure. Pulmonary Artery: The pulmonary artery is of normal size. IAS/Shunts: The interatrial septum was not well visualized.  LEFT VENTRICLE PLAX 2D LVIDd:         5.00 cm      Diastology LVIDs:         3.88 cm      LV e' medial:    11.00 cm/s LV PW:         0.78 cm      LV E/e' medial:  7.5 LV IVS:        0.66 cm      LV e' lateral:   16.50 cm/s LVOT diam:     2.00 cm      LV E/e' lateral: 5.0 LV SV:         51 LV SV Index:   26           2D Longitudinal Strain LVOT Area:     3.14 cm     2D Strain GLS Avg:     -15.0 %  LV Volumes (MOD) LV vol d, MOD A2C: 107.0 ml LV vol d, MOD A4C: 85.4 ml LV vol s, MOD A2C: 81.2 ml LV vol s, MOD A4C: 61.7 ml LV SV MOD A2C:     25.8 ml LV SV MOD A4C:     85.4 ml LV SV MOD BP:      24.5 ml RIGHT VENTRICLE RV Basal diam:  3.58 cm RV S prime:     12.10 cm/s LEFT ATRIUM             Index        RIGHT ATRIUM           Index LA diam:  3.50 cm 1.79 cm/m   RA Area:     11.20 cm LA Vol (A2C):   28.3 ml 14.49 ml/m  RA Volume:   25.30 ml  12.96 ml/m LA Vol (A4C):   33.0 ml 16.90 ml/m LA Biplane Vol: 32.7 ml 16.75 ml/m  AORTIC VALVE                    PULMONIC VALVE AV Area (Vmax):    2.35 cm     PV Vmax:       0.90 m/s AV Area (Vmean):   2.60 cm     PV Vmean:      59.000 cm/s AV Area (VTI):     2.48 cm     PV VTI:        0.159 m AV Vmax:           117.00 cm/s  PV Peak grad:  3.2 mmHg AV Vmean:          75.700 cm/s  PV Mean grad:  2.0 mmHg AV VTI:             0.204 m AV Peak Grad:      5.5 mmHg AV Mean Grad:      3.0 mmHg LVOT Vmax:         87.50 cm/s LVOT Vmean:        62.600 cm/s LVOT VTI:          0.161 m LVOT/AV VTI ratio: 0.79  AORTA Ao Root diam: 2.70 cm MITRAL VALVE MV Area (PHT): 5.50 cm    SHUNTS MV Area VTI:   3.10 cm    Systemic VTI:  0.16 m MV Peak grad:  3.3 mmHg    Systemic Diam: 2.00 cm MV Mean grad:  2.0 mmHg MV Vmax:       0.90 m/s MV Vmean:      62.6 cm/s MV Decel Time: 138 msec MV E velocity: 82.30 cm/s MV A velocity: 58.70 cm/s MV E/A ratio:  1.40 Nelva Bush MD Electronically signed by Nelva Bush MD Signature Date/Time: 09/28/2021/5:14:34 PM    Final    ECHOCARDIOGRAM COMPLETE  Result Date: 08/31/2021    ECHOCARDIOGRAM REPORT   Patient Name:   DENEISHA DADE Date of Exam: 08/31/2021 Medical Rec #:  [id number]      Height:       67.0 in Accession #:    [id number]     Weight:       183.3 lb Date of Birth:  04/01/1981      BSA:          1.949 m Patient Age:    65 years       BP:           116/65 mmHg Patient Gender: F              HR:           81 bpm. Exam Location:  [facility] Procedure: 2D Echo, Color Doppler and Cardiac Doppler Indications:     Z09 Chemotherapy  History:         Patient has prior history of Echocardiogram examinations, most                  recent 05/05/2021. Breast cancer.  Sonographer:     Charmayne Sheer Referring Phys:  [id number] Anayelli Lai Diagnosing Phys: Nelva Bush MD  Sonographer Comments: Suboptimal apical window. Image acquisition challenging due to mastectomy. Global longitudinal strain was attempted.  IMPRESSIONS  1. Left ventricular ejection fraction, by estimation, is 50 to 55%. The left ventricle has low normal function. Left ventricular endocardial border not optimally defined to evaluate regional wall motion. Left ventricular diastolic parameters were normal. The average left ventricular global longitudinal strain is -13.4 %. The global longitudinal strain is abnormal.  2. Right ventricular systolic function  is normal. The right ventricular size is normal.  3. The mitral valve is normal in structure. No evidence of mitral valve regurgitation. No evidence of mitral stenosis.  4. The aortic valve is tricuspid. Aortic valve regurgitation is trivial. No aortic stenosis is present. FINDINGS  Left Ventricle: Left ventricular ejection fraction, by estimation, is 50 to 55%. The left ventricle has low normal function. Left ventricular endocardial border not optimally defined to evaluate regional wall motion. The average left ventricular global longitudinal strain is -13.4 %. The global longitudinal strain is abnormal. The left ventricular internal cavity size was normal in size. There is no left ventricular hypertrophy. Left ventricular diastolic parameters were normal. Right Ventricle: The right ventricular size is normal. No increase in right ventricular wall thickness. Right ventricular systolic function is normal. Left Atrium: Left atrial size was normal in size. Right Atrium: Right atrial size was normal in size. Pericardium: There is no evidence of pericardial effusion. Mitral Valve: The mitral valve is normal in structure. No evidence of mitral valve regurgitation. No evidence of mitral valve stenosis. Tricuspid Valve: The tricuspid valve is normal in structure. Tricuspid valve regurgitation is trivial. Aortic Valve: The aortic valve is tricuspid. Aortic valve regurgitation is trivial. No aortic stenosis is present. Pulmonic Valve: The pulmonic valve was grossly normal. Pulmonic valve regurgitation is not visualized. No evidence of pulmonic stenosis. Aorta: The aortic root is normal in size and structure. Pulmonary Artery: The pulmonary artery is of normal size. Venous: The inferior vena cava was not well visualized. IAS/Shunts: The interatrial septum was not well visualized.   2D Longitudinal Strain 2D Strain GLS Avg:     -13.4 % Nelva Bush MD Electronically signed by Nelva Bush MD Signature Date/Time:  08/31/2021/5:31:00 PM    Final    US THYROID  Result Date: 08/09/2021 CLINICAL DATA:  8 mm thyroid nodule on the right by thoracic spine MRI EXAM: THYROID ULTRASOUND TECHNIQUE: Ultrasound examination of the thyroid gland and adjacent soft tissues was performed. COMPARISON:  07/20/2021 FINDINGS: Parenchymal Echotexture: Normal Isthmus: 5 mm Right lobe: 5.8 x 1.3 x 2.7 cm Left lobe: 5.3 x 1.3 x 1.6 cm _________________________________________________________ Estimated total number of nodules >/= 1 cm: 1 Number of spongiform nodules >/=  2 cm not described below (TR1): 0 Number of mixed cystic and solid nodules >/= 1.5 cm not described below (Garza): 0 _________________________________________________________ Nodule # 1: Location: Right; Mid Maximum size: 1.1 cm; Other 2 dimensions: 0.8 x 0.5 cm Composition: solid/almost completely solid (2) Echogenicity: hypoechoic (2) Shape: not taller-than-wide (0) Margins: smooth (0) Echogenic foci: none (0) ACR TI-RADS total points: 4. ACR TI-RADS risk category: TR4 (4-6 points). ACR TI-RADS recommendations: *Given size (>/= 1 - 1.4 cm) and appearance, a follow-up ultrasound in 1 year should be considered based on TI-RADS criteria. _________________________________________________________ Left thyroid lobe demonstrates subcentimeter cystic nodules all measuring 8 mm or less in size. These would not meet criteria for any biopsy or follow-up and are not fully described by TI rads criteria. No hypervascularity.  No regional adenopathy. IMPRESSION: 1.1 cm right mid thyroid TR 4 nodule meets criteria follow-up in 1 year. This correlates with  the MR finding. The above is in keeping with the ACR TI-RADS recommendations - J Am Coll Radiol 2017;14:587-595. Electronically Signed   By: Jerilynn Mages.  Shick M.D.   On: 08/09/2021 11:31         Assessment and plan-  Cancer Staging Breast cancer of upper-outer quadrant of right female breast [facility]) Staging form: Breast, AJCC 8th Edition -  Clinical: G2, ER+, PR+, HER2- - Signed by Earlie Server, MD on 04/05/2018 Histologic grading system: 3 grade system - Pathologic stage from 04/04/2018: No Stage Recommended (ypT3, pN2, cM0, G3, ER+, PR-, HER2-) - Signed by Earlie Server, MD on 04/04/2018 Stage prefix: Post-therapy Neoadjuvant therapy: Yes Response to neoadjuvant therapy: No response Method of lymph node assessment: Axillary lymph node dissection Multigene prognostic tests performed: None Histologic grading system: 3 grade system Laterality: Right - Pathologic: Stage IV (rpTX, pNX, cM1, ER+, PR-, HER2+) - Signed by Earlie Server, MD on 12/10/2020 Stage prefix: Recurrence    1. Metastatic breast cancer ([place])   2. HER2-positive carcinoma of breast ([place])   3. Thyroid nodule    Recurrent breast cancer with isolated bone metastasis, stage IV #Initially stage IIIA right breast cancer, ER positive, HER-2 negative status post bilateral oophorectomy, Right mastectomy and right axillary lymph node dissection, status post implant, and implant removal.  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 Estrogen positive, HER-2 positive breast cancer.- s/p Palliative radiation to T7  Hold trastuzumab and pertuzumab due to further decrease of heart function. 09/28/2021, echocardiogram showed further decrease of LVEF to 45%.  Hopefully reduction of LVEF is reversible. Patient has an appointment with cardiologist Dr.End.  Appreciate input Continue fulvestrant monthly. Repeat PET in Nov 2022  #Anemia, hemoglobin stable   likely secondary to methotrexate.   Hb is stable.   # Hypokalemia, does not tolerate oral potassium.  Potassium is stable. #Treatment induced diarrhea,Continue Lomotil as needed.  Stable symptom  # Bone metastasis, status post radiation. Continue Zometa monthly.  #Back pain worsening.  Continue oxycodone as needed.  Pending PET scan for evaluation. Consider  MRI lumbar spine if needed in the future.  #Chemotherapy-induced neuropathy, continue Lyrica and nortriptyline #Depression.  Continue Lexapro 20 mg daily. #Rheumatoid arthritis,   Follow up with  rheumatology.  Patient is on methotrexate, recently added plaquenil.  # thyroid nodules -  thyroid US showed left lobe nodules are small no need for follow-up.  Right lobe nodule 1.1 cm, repeat thyroid ultrasound in Aug 2023.   Follow-up To be determined.  Pending cardiology evaluation and recommendation. PET in Nov 2022   Earlie Server, MD, PhD [facility] [place] [facility] 10/01/21

## 2021-10-04 ENCOUNTER — Encounter: Payer: Self-pay | Admitting: Oncology

## 2021-10-04 NOTE — Progress Notes (Signed)
Cardiology Office Note    Date:  10/06/2021   ID:  Theresa Chaney, Theresa Chaney [birth year], MRN [id number]  PCP:  Marguerita Merles, MD  Cardiologist:  Nelva Bush, MD  Electrophysiologist:  None   Chief Complaint: Follow-up  History of Present Illness:   Theresa Chaney is a 40 y.o. female with history of recurrent breast cancer with isolated bone metastasis s/p mastectomy and chemoradiation complicated by a mildly reduced LVEF, WCT, paroxysmal SVT, lymphedema followed by the lymphedema clinic, COPD, anemia, migraine disorder, and GERD who presents for follow up of echo.   She was previously evaluated in 10/2017 for intermittent sinus tachycardia and DOE. At that time echoes in 10/2017 and 04/2018 showed normal LV function and it was ultimately felt her symptoms were secondary to chemotherapy. In 08/2018, she experienced upper extremity swelling with repeat echo at that time showing an EF of 45% with question of anteroseptal hypokinesis and moderate right ventricular enlargement. Upon her primary cardiologist reviewing the echo, it was felt her EF was closer to 50-55%. She was placed on Toprol XL. Lexiscan Myoview in 10/2018 showed a medium defect of moderate severity present in the the basal anterior and mild anterior location, EF 55-65%. This was a very suboptimal study due to breast attenuation artifact as well as extracardiac uptake at the inferior border of the heart. There was reversible anterior wall defect suggestive of ischemia, though it was felt this was likely artifact given the defect did not extend to the apex. She was seen in the office in 11/2018 for follow up and was feeling well outside of intermittent palpitations over the prior couple of weeks with report of heart rates of 150 bpm. Episodes would typically last a few minutes. In this setting, she wore a Zio monitor that showed the predominant rhythm was sinus with an average heart rate of 82 bpm (range 48-175 bpm). Rare PACs and PVCs were  noted with a single atrial run lasting 4 beats. There were no sustained arrhythmias or prolonged pauses. Patient triggered events corresponded to sinus rhythm and artifact. In this setting, her Toprol XL was increased to 25 mg. She underwent echo on 02/19/2019 which showed improvement in her EF to 45-80%, normal diastolic function, and no significant valvular abnormalities. She was seen in the office in 02/2019 for presyncope. Repeat cardiac monitoring at that time showed a predominant rhythm of sinus with an average heart rate of 88 bpm (range 47-181 bpm in sinus), 2 episodes of WCT favored to be NSVT over SVT with aberrancy lasting up to 7 beats, and rare PACs/PVCs. She has continued to undergo periodic echoes for chemotherapy evaluation as outlined below.   She has undergone periodic echocardiograms for chemotherapy monitoring which have demonstrated:    Echo in 10/2017 demonstrated an EF of 60 to 65%, normal wall motion, grade 1 diastolic dysfunction, normal RV systolic function, normal PASP, and no significant valvular abnormalities.  Echo in 04/2018 showed an EF of 55 to 60%, normal wall motion, normal LV diastolic function.  Echo in 08/2018 showed an EF of 45%, diffuse hypokinesis, grade 1 diastolic dysfunction, mild mitral regurgitation, mild biatrial enlargement, and moderately dilated RV.  Echo in 01/2020 demonstrated an EF of 60 to 65%, no regional wall motion abnormalities, normal RV systolic function and ventricular cavity size, and no significant valvular abnormalities.    Echo in 09/2020 continues to show a preserved LV systolic function with an EF of 60 to 99%, grade 1 diastolic dysfunction, normal RV  systolic function and ventricular cavity size, and no significant valvular abnormalities.    Echo in 01/2021 demonstrated an EF of 55-60%, no RWMA, normal RV systolic function and ventricular cavity size, and no significant valvular abnormalities.   Echo in 04/2021 showed an EF of 55 to 60%,  no regional wall motion abnormalities, normal RV systolic function and ventricular cavity size, and no significant valvular abnormalities.  Echo in 08/2021 showed a low normal LV systolic function with an EF of 50 to 55%, normal LV diastolic function parameters, normal RV systolic function and ventricular cavity size, and no significant valvular abnormalities.    Most recent echo from 09/28/2021 demonstrated an EF of 45%, global hypokinesis, normal RV systolic function and ventricular cavity size, and trivial mitral regurgitation.    Given slight reduction in her LVEF, appointment was scheduled for today.  She comes in today doing reasonably well from a cardiac perspective.  She does note fatigue.  She is without angina, dyspnea, palpitations, presyncope, or syncope.  No lower extremity swelling, abdominal distention, or orthopnea.  No early satiety.  Tolerating Toprol-XL without issues.  With her noted noted reduction in EF, her trastuzumab and pertuzumab have been held.     Labs independently reviewed: 09/2021 - Hgb 10.8, PLT 270, potassium 3.4, BUN 17, serum creatinine 0.86, albumin 4.2, AST/ALT normal, magnesium 2.0 07/2021 - TSH normal    Past Medical History:  Diagnosis Date   Anemia    Arthritis    BRCA negative 11/26/2017   Breast cancer ([place]) 10/11/2017   Multifocal, ER positive, PR negative, HER-2 negative. ypT3 ypN2a 8.7 cm, 4/15 nodes   Cardiomyopathy ([place])    a. 10/2017 Echo: EF 60-65%, no rwma, Gr1 DD, nl RV size/fxn; b. 04/2018 Echo: EF 55-60%, no rwma, Nl RV size/fxn; c. 08/2018 Echo: EF 45%, diff HK, ? HK of antsept wall. Gr1 DD. Mild MR. Mild LAE/RAE. Mod dil RV.    Chronic bronchitis ([place]) 11/2017   COPD (chronic obstructive pulmonary disease) ([facility])    MILD PER CXR   Depression    Family history of cancer    GERD (gastroesophageal reflux disease)    Headache    MIGRAINES   Heart murmur    ASYMPTOMATIC   Personal history of chemotherapy    current for right breast  ca    Past Surgical History:  Procedure Laterality Date   AXILLARY LYMPH NODE DISSECTION Right 03/19/2018   Procedure: AXILLARY LYMPH NODE DISSECTION;  Surgeon: Robert Bellow, MD;  Location: [facility];  Service: General;  Laterality: Right;   BREAST BIOPSY Right 10/11/2017   12:30 posterior coil clip invasive mammary carcinoma   BREAST BIOPSY Right 10/11/2017   11:30 middle depth ribbon clip DCIS   BREAST BIOPSY Right 10/11/2017   5:30 anterior depth x shape invasive ductal carcinoma   BREAST IMPLANT REMOVAL Right 06/03/2019   Procedure: REMOVAL OF RIGHT BREAST IMPLANTS;  Surgeon: Wallace Going, DO;  Location: [facility];  Service: Plastics;  Laterality: Right;   BREAST RECONSTRUCTION WITH PLACEMENT OF TISSUE EXPANDER AND FLEX HD (ACELLULAR HYDRATED DERMIS) Right 03/19/2018   Procedure: BREAST RECONSTRUCTION WITH PLACEMENT OF TISSUE EXPANDER AND FLEX HD (ACELLULAR HYDRATED DERMIS);  Surgeon: Wallace Going, DO;  Location: [facility];  Service: Plastics;  Laterality: Right;   CARPAL TUNNEL RELEASE Bilateral 2020   CHOLECYSTECTOMY N/A 04/27/2020   Procedure: LAPAROSCOPIC CHOLECYSTECTOMY WITH INTRAOPERATIVE CHOLANGIOGRAM;  Surgeon: Robert Bellow, MD;  Location: [facility];  Service: General;  Laterality: N/A;  ESOPHAGOGASTRODUODENOSCOPY (EGD) WITH PROPOFOL N/A 04/17/2020   Procedure: ESOPHAGOGASTRODUODENOSCOPY (EGD) WITH PROPOFOL;  Surgeon: Robert Bellow, MD;  Location: [facility];  Service: Endoscopy;  Laterality: N/A;  with biopsy   LAPAROSCOPIC BILATERAL SALPINGO OOPHERECTOMY Bilateral 03/19/2018   Procedure: LAPAROSCOPIC BILATERAL SALPINGO OOPHORECTOMY;  Surgeon: Benjaman Kindler, MD;  Location: [facility];  Service: Gynecology;  Laterality: Bilateral;   MASTECTOMY Right 03/2018   MASTECTOMY W/ SENTINEL NODE BIOPSY Right 03/19/2018   Procedure: MASTECTOMY WITH SENTINEL LYMPH NODE BIOPSY;  Surgeon: Robert Bellow, MD;  Location: [facility];  Service: General;  Laterality:  Right;   PORT-A-CATH REMOVAL Left 06/03/2019   Procedure: REMOVAL PORT-A-CATH;  Surgeon: Robert Bellow, MD;  Location: [facility];  Service: General;  Laterality: Left;   PORTACATH PLACEMENT Left 10/24/2017   Procedure: INSERTION PORT-A-CATH;  Surgeon: Robert Bellow, MD;  Location: [facility];  Service: General;  Laterality: Left;   PORTACATH PLACEMENT Right 09/28/2020   Procedure: INSERTION PORT-A-CATH;  Surgeon: Robert Bellow, MD;  Location: [facility];  Service: General;  Laterality: Right;   REMOVAL OF TISSUE EXPANDER AND PLACEMENT OF IMPLANT Right 07/20/2018   Procedure: REMOVAL OF RIGHT BREAST TISSUE EXPANDER AND PLACEMENT OF IMPLANT;  Surgeon: Wallace Going, DO;  Location: [facility];  Service: Plastics;  Laterality: Right;   SIMPLE MASTECTOMY WITH AXILLARY SENTINEL NODE BIOPSY Left 06/03/2019   Procedure: SIMPLE MASTECTOMY LEFT;  Surgeon: Robert Bellow, MD;  Location: [facility];  Service: General;  Laterality: Left;    Current Medications: Current Meds  Medication Sig   acetaminophen (TYLENOL) 500 MG tablet Take 500 mg by mouth every 6 (six) hours as needed.   albuterol (VENTOLIN HFA) 108 (90 Base) MCG/ACT inhaler Inhale 2 puffs into the lungs every 6 (six) hours as needed for wheezing or shortness of breath.    CALCIUM CARBONATE-VITAMIN D PO Take 600-800 mg by mouth in the morning, at noon, and at bedtime.   cyanocobalamin (,VITAMIN B-12,) 1000 MCG/ML injection Inject 1,000 mcg into the skin every 30 (thirty) days.   cyclobenzaprine (FLEXERIL) 5 MG tablet Take 5 mg by mouth 3 (three) times daily as needed for muscle spasms.   diphenoxylate-atropine (LOMOTIL) 2.5-0.025 MG tablet Take 1 tablet by mouth 4 (four) times daily as needed for diarrhea or loose stools.   escitalopram (LEXAPRO) 20 MG tablet Take 20 mg by mouth daily.   esomeprazole (NEXIUM) 40 MG capsule Take 40 mg by mouth daily before breakfast.    Eszopiclone 3 MG TABS Take 3 mg by mouth at  bedtime as needed (sleep).    folic acid (FOLVITE) 1 MG tablet Take 1 mg by mouth daily.   hydroxychloroquine (PLAQUENIL) 200 MG tablet Take 200 mg by mouth daily.   ibuprofen (ADVIL) 800 MG tablet Take 800 mg by mouth every 8 (eight) hours as needed for moderate pain.   lidocaine-prilocaine (EMLA) cream Apply to port and cover 1-2 hours prior to appointment   loperamide (IMODIUM) 2 MG capsule Take 2 tablets with onset of diarrhea, then take 1 tablet every 2 hours until diarrhea stops. Maximum 8 tablets in 24hours   loratadine (CLARITIN) 10 MG tablet Take 10 mg by mouth daily.    LORazepam (ATIVAN) 1 MG tablet Take 1 mg by mouth 3 (three) times daily.   Magnesium 500 MG TABS Take 500 mg by mouth 2 (two) times daily.   methotrexate (RHEUMATREX) 2.5 MG tablet Take 25 mg by mouth every Sunday. 10 tablets once a week  metoprolol succinate (TOPROL-XL) 25 MG 24 hr tablet Take 37.5 mg by mouth every morning.    mupirocin ointment (BACTROBAN) 2 % Place 1 application into the nose 2 (two) times daily. Use in each nostril twice daily for five (5) days.   nortriptyline (PAMELOR) 10 MG capsule Take 30 mg by mouth at bedtime.    oxyCODONE (OXY IR/ROXICODONE) 5 MG immediate release tablet Take 1 tablet (5 mg total) by mouth every 6 (six) hours as needed for moderate pain or severe pain.   phentermine (ADIPEX-P) 37.5 MG tablet Take 37.5 mg by mouth daily before breakfast.    pregabalin (LYRICA) 150 MG capsule Take 150 mg by mouth 2 (two) times daily.   promethazine (PHENERGAN) 25 MG tablet Take 1 tablet (25 mg total) by mouth every 8 (eight) hours as needed for nausea or vomiting.   pyridOXINE (VITAMIN B-6) 100 MG tablet Take 100 mg by mouth daily.   topiramate (TOPAMAX) 50 MG tablet Take 50 mg by mouth 2 (two) times daily.    vitamin C (ASCORBIC ACID) 500 MG tablet Take 500 mg by mouth daily.    Allergies:   Patient has no known allergies.   Social History   Socioeconomic History   Marital status:  Married    Spouse name: Not on file   Number of children: Not on file   Years of education: Not on file   Highest education level: Not on file  Occupational History   Occupation: pharmacy tech    Comment: [employer]   Tobacco Use   Smoking status: Every Day    Packs/day: 0.50    Years: 18.00    Pack years: 9.00    Types: Cigarettes    Start date: 06/21/2018   Smokeless tobacco: Never  Vaping Use   Vaping Use: Never used  Substance and Sexual Activity   Alcohol use: No   Drug use: No   Sexual activity: Yes    Birth control/protection: Injection, Other-see comments    Comment: has had hysterectomy  Other Topics Concern   Not on file  Social History Narrative   Lives at home with husband and daughter   Social Determinants of Health   Financial Resource Strain: Not on file  Food Insecurity: Not on file  Transportation Needs: Not on file  Physical Activity: Not on file  Stress: Not on file  Social Connections: Not on file     Family History:  The patient's family history includes Bladder Cancer in her maternal grandmother; Cervical cancer (age of onset: 27) in her maternal aunt; Diabetes in her father; Heart attack (age of onset: 61) in her father; Hyperlipidemia in her father; Hypertension in her father; Melanoma in her maternal aunt and maternal uncle.  ROS:   12-point review of systems is negative unless otherwise noted in the HPI.   EKGs/Labs/Other Studies Reviewed:    Studies reviewed were summarized above. The additional studies were reviewed today:  2D echo 09/28/2021: 1. Left ventricular ejection fraction, by estimation, is 45%. The left  ventricle has mild to moderately decreased function. The left ventricle  demonstrates global hypokinesis. Left ventricular diastolic parameters  were normal. The average left  ventricular global longitudinal strain is -15.0 %. The global longitudinal  strain is abnormal.   2. Right ventricular  systolic function is normal. The right ventricular  size is normal. Tricuspid regurgitation signal is inadequate for assessing  PA pressure.   3. The mitral valve is normal in structure.  Trivial mitral valve  regurgitation. No evidence of mitral stenosis.   4. The aortic valve is tricuspid. Aortic valve regurgitation is not  visualized. No aortic stenosis is present.   Comparison(s): A prior study was performed on 08/31/2021. The left  ventricular function is slightly worse.  __________  2D echo 08/31/2021: 1. Left ventricular ejection fraction, by estimation, is 50 to 55%. The  left ventricle has low normal function. Left ventricular endocardial  border not optimally defined to evaluate regional wall motion. Left  ventricular diastolic parameters were  normal. The average left ventricular global longitudinal strain is -13.4  %. The global longitudinal strain is abnormal.   2. Right ventricular systolic function is normal. The right ventricular  size is normal.   3. The mitral valve is normal in structure. No evidence of mitral valve  regurgitation. No evidence of mitral stenosis.   4. The aortic valve is tricuspid. Aortic valve regurgitation is trivial.  No aortic stenosis is present.  __________  Limited 2D echo 05/05/2021: 1. Left ventricular ejection fraction, by estimation, is 55 to 60%. The  left ventricle has normal function. The left ventricle has no regional  wall motion abnormalities.   2. Right ventricular systolic function is normal. The right ventricular  size is normal.   3. The mitral valve is normal in structure. No evidence of mitral valve  regurgitation.   4. The aortic valve is tricuspid. Aortic valve regurgitation is not  visualized.  __________   Limited 2D echo 01/29/2021:  1. Left ventricular ejection fraction, by estimation, is 55 to 60%. The  left ventricle has normal function. The left ventricle has no regional  wall motion abnormalities. Left  ventricular diastolic function could not  be evaluated.   2. Right ventricular systolic function is normal. The right ventricular  size is normal.   3. The mitral valve is normal in structure. Trivial mitral valve  regurgitation.   4. The aortic valve is normal in structure. Aortic valve regurgitation is  not visualized.  __________   Limited 2D echo 09/14/2020: 1. Left ventricular ejection fraction, by estimation, is 60 to 65%. Left  ventricular ejection fraction by PLAX is 68 %. The left ventricle has  normal function. Left ventricular diastolic parameters are consistent with  Grade I diastolic dysfunction  (impaired relaxation).   2. Right ventricular systolic function is normal. The right ventricular  size is normal.   3. The mitral valve was not well visualized. No evidence of mitral valve  regurgitation.   4. Aortic valve regurgitation is not visualized.  __________   2D echo 01/29/2020: 1. Left ventricular ejection fraction, by estimation, is 60 to 65%. The  left ventricle has normal function. The left ventricle has no regional  wall motion abnormalities. Indeterminate diastolic filling due to E-A  fusion.   2. Right ventricular systolic function is normal. The right ventricular  size is normal.   3. The mitral valve is normal in structure and function. No evidence of  mitral valve regurgitation. No evidence of mitral stenosis.   4. The aortic valve is normal in structure and function. Aortic valve  regurgitation is not visualized. No aortic stenosis is present.  __________   Elwyn Reach patch 02/2019: The patient was monitored for 7 days. The predominant rhythm was sinus with an average rate of 88 bpm (range 47 to 181 bpm in sinus). Rare PAC's and PVC's were noted. Two episodes of wide-complex tachycardia (favor nonsustained ventricular tachycardia over supraventricular tachycardia with aberrancy) occurred,  lasting up to 7 beats. Patient triggered events correspond to normal  sinus rhythm and narrow complex tachycardia (favor sinus tachycardia).   Predominantly sinus rhythm with two brief episodes of wide-complex tachycardia (favor NSVT over SVT with aberrancy).  Rare PAC's and PVC's also noted.  Patient triggered events correspond to sinus rhythm and narrow complex tachycardia (favor sinus tachycardia but cannot exclude atrial tachycardia). __________   Limited 2D echo 02/19/2019: 1. The left ventricle has normal systolic function, with an ejection  fraction of 55-60%. Left ventricular diastolic parameters were normal.   2. The right ventricle has normal systolc function. The cavity was  normal. There is no increase in right ventricular wall thickness.   3. The aortic valve was not well visualized. __________   Elwyn Reach patch 11/2018: The patient was monitored for 13 days, 8 hours. The predominant rhythm was sinus with an average rate of 82 bpm (range 48 to 175 bpm). Rare PACs and PVCs were noted. A single atrial run lasting 4 beats was noted. There was no sustained arrhythmia or prolonged pause. Patient triggered events correspond to sinus rhythm and artifact.   Predominantly sinus rhythm with rare PACs and PVCs.  Brief atrial run lasting 4 beats noted. __________   Carlton Adam MPI 10/12/2018: T wave inversion was noted during stress in the III, II, aVF, V3, V2, V4, V5 and V6 leads, and returning to baseline after 1-5 mins of recovery. There was no ST segment deviation noted during stress. Defect 1: There is a medium defect of moderate severity present in the basal anterior and mid anterior location. The left ventricular ejection fraction is normal (55-65%). Very suboptimal study due to breast attenuation artifact as well as extracardiac uptake at the inferior border of the heart. There is a reversible anterior wall defect suggestive of ischemia. However, I suspect that this is likely artifact given that the defect does not extend to the apex. Correlate  clinically. __________   2D echo 08/15/2018: - Left ventricle: The cavity size was mildly dilated. Systolic    function was mildly reduced. The estimated ejection fraction was    45%. Diffuse hypokinesis. Hypokinesis of the anteroseptal    myocardium. Doppler parameters are consistent with abnormal left    ventricular relaxation (grade 1 diastolic dysfunction).  - Aortic valve: Valve area (VTI): 1.88 cm^2. Valve area (Vmax): 1.8    cm^2. Valve area (Vmean): 1.74 cm^2.  - Mitral valve: There was mild regurgitation. Valve area by    continuity equation (using LVOT flow): 2.22 cm^2.  - Left atrium: The atrium was mildly dilated.  - Right ventricle: The cavity size was moderately dilated.  - Right atrium: The atrium was mildly dilated.   Impressions:   - Mild LV systolic dysfunction with wall motion abnormalities    suggestive of CAD.  __________   2D echo 04/26/2018: - Left ventricle: The cavity size was normal. Wall thickness was    normal. Systolic function was normal. The estimated ejection    fraction was in the range of 55% to 60%. Wall motion was normal;    there were no regional wall motion abnormalities. Left    ventricular diastolic function parameters were normal.  - Pulmonary arteries: Systolic pressure could not be accurately    estimated.  __________   2D echo 10/26/2017: - Left ventricle: The cavity size was normal. Systolic function was    normal. The estimated ejection fraction was in the range of 60%    to 65%. Wall motion was normal;  there were no regional wall    motion abnormalities. Doppler parameters are consistent with    abnormal left ventricular relaxation (grade 1 diastolic    dysfunction).  - Left atrium: The atrium was normal in size.  - Right ventricle: Systolic function was normal.  - Pulmonary arteries: Systolic pressure was within the normal    range.     EKG:  EKG is ordered today.  The EKG ordered today demonstrates NSR, 86 bpm, no acute ST-T  changes  Recent Labs: 07/30/2021: TSH 0.819 10/01/2021: ALT 19; BUN 17; Creatinine, Ser 0.86; Hemoglobin 10.8; Magnesium 2.0; Platelets 270; Potassium 3.4; Sodium 136  Recent Lipid Panel No results found for: CHOL, TRIG, HDL, CHOLHDL, VLDL, LDLCALC, LDLDIRECT  PHYSICAL EXAM:    VS:  BP 90/70 (BP Location: Left Arm, Patient Position: Sitting, Cuff Size: Normal)   Pulse 87   Ht 5' 7"  (1.702 m)   Wt 177 lb (80.3 kg)   LMP  (LMP Unknown) Comment: Ovaries and tubes removed.April 2019  SpO2 98%   BMI 27.72 kg/m   BMI: Body mass index is 27.72 kg/m.  Physical Exam Vitals reviewed.  Constitutional:      Appearance: She is well-developed.  HENT:     Head: Normocephalic and atraumatic.  Eyes:     General:        Right eye: No discharge.        Left eye: No discharge.  Neck:     Vascular: No JVD.  Cardiovascular:     Rate and Rhythm: Normal rate and regular rhythm.     Pulses:          Posterior tibial pulses are 2+ on the right side and 2+ on the left side.     Heart sounds: Normal heart sounds, S1 normal and S2 normal. Heart sounds not distant. No midsystolic click and no opening snap. No murmur heard.   No friction rub.  Pulmonary:     Effort: Pulmonary effort is normal. No respiratory distress.     Breath sounds: Normal breath sounds. No decreased breath sounds, wheezing or rales.  Chest:     Chest wall: No tenderness.  Abdominal:     General: There is no distension.     Palpations: Abdomen is soft.     Tenderness: There is no abdominal tenderness.  Musculoskeletal:     Cervical back: Normal range of motion.     Right lower leg: No edema.     Left lower leg: No edema.  Skin:    General: Skin is warm and dry.     Nails: There is no clubbing.  Neurological:     Mental Status: She is alert and oriented to person, place, and time.  Psychiatric:        Speech: Speech normal.        Behavior: Behavior normal.        Thought Content: Thought content normal.         Judgment: Judgment normal.    Wt Readings from Last 3 Encounters:  10/06/21 177 lb (80.3 kg)  10/01/21 186 lb 11.2 oz (84.7 kg)  09/10/21 184 lb 3.2 oz (83.6 kg)     ASSESSMENT & PLAN:   Chemotherapy-induced systolic dysfunction/NICM: She appears euvolemic and well compensated.  Hopefully, her cardiomyopathy is reversible following the holding of pertuzumab and pertuzumab.  Ideally, would like to start low-dose ACE inhibitor or ARB, however hypotension precludes that at this time.  She does remain on Toprol-XL which  will be continued.  In follow-up, if BP allows would recommend addition of low-dose losartan or lisinopril.  We will obtain a limited echo in 1 month to reevaluate her LV systolic function of cardiotoxic medication.  If her cardiomyopathy persists at that time further evaluation will be indicated along with escalation of medical therapy as able.  CHF education.  WCT/paroxysmal SVT/palpitations: Quiescent.  She remains on Toprol-XL.  This was not discussed in detail at today's visit given the above.  Recurrent breast cancer with isolated bone metastasis: Management per oncology.  Disposition: F/u with Dr. Saunders Revel or an APP in approximately 6 weeks.   Medication Adjustments/Labs and Tests Ordered: Current medicines are reviewed at length with the patient today.  Concerns regarding medicines are outlined above. Medication changes, Labs and Tests ordered today are summarized above and listed in the Patient Instructions accessible in Encounters.   Signed, Christell Faith, PA-C 10/06/2021 3:17 PM     [place] [address] [place], [place] [address] [phone]

## 2021-10-06 ENCOUNTER — Encounter: Payer: Self-pay | Admitting: Physician Assistant

## 2021-10-06 ENCOUNTER — Other Ambulatory Visit: Payer: Self-pay

## 2021-10-06 ENCOUNTER — Ambulatory Visit (INDEPENDENT_AMBULATORY_CARE_PROVIDER_SITE_OTHER): Payer: Medicare HMO | Admitting: Physician Assistant

## 2021-10-06 VITALS — BP 90/70 | HR 87 | Ht 67.0 in | Wt 177.0 lb

## 2021-10-06 DIAGNOSIS — I428 Other cardiomyopathies: Secondary | ICD-10-CM | POA: Diagnosis not present

## 2021-10-06 DIAGNOSIS — T451X5A Adverse effect of antineoplastic and immunosuppressive drugs, initial encounter: Secondary | ICD-10-CM

## 2021-10-06 DIAGNOSIS — C50411 Malignant neoplasm of upper-outer quadrant of right female breast: Secondary | ICD-10-CM

## 2021-10-06 DIAGNOSIS — I427 Cardiomyopathy due to drug and external agent: Secondary | ICD-10-CM | POA: Diagnosis not present

## 2021-10-06 DIAGNOSIS — I471 Supraventricular tachycardia: Secondary | ICD-10-CM | POA: Diagnosis not present

## 2021-10-06 DIAGNOSIS — R000 Tachycardia, unspecified: Secondary | ICD-10-CM | POA: Diagnosis not present

## 2021-10-06 DIAGNOSIS — Z170 Estrogen receptor positive status [ER+]: Secondary | ICD-10-CM

## 2021-10-06 NOTE — Patient Instructions (Signed)
Medication Instructions:  Your physician recommends that you continue on your current medications as directed. Please refer to the Current Medication list given to you today.  *If you need a refill on your cardiac medications before your next appointment, please call your pharmacy*   Lab Work: None ordered If you have labs (blood work) drawn today and your tests are completely normal, you will receive your results only by: [facility] (if you have MyChart) OR A paper copy in the mail If you have any lab test that is abnormal or we need to change your treatment, we will call you to review the results.   Testing/Procedures:  Your physician has requested that you have an echocardiogram after 10/29/21. Echocardiography is a painless test that uses sound waves to create images of your heart. It provides your doctor with information about the size and shape of your heart and how well your heart's chambers and valves are working. This procedure takes approximately one hour. There are no restrictions for this procedure.    Follow-Up: At [facility], you and your health needs are our priority.  As part of our continuing mission to provide you with exceptional heart care, we have created designated Provider Care Teams.  These Care Teams include your primary Cardiologist (physician) and Advanced Practice Providers (APPs -  Physician Assistants and Nurse Practitioners) who all work together to provide you with the care you need, when you need it.  We recommend signing up for the patient portal called "MyChart".  Sign up information is provided on this After Visit Summary.  MyChart is used to connect with patients for Virtual Visits (Telemedicine).  Patients are able to view lab/test results, encounter notes, upcoming appointments, etc.  Non-urgent messages can be sent to your provider as well.   To learn more about what you can do with MyChart, go to [email or web address].    Your next  appointment:   6 week(s)  The format for your next appointment:   In Person  Provider:   You may see Nelva Bush, MD or one of the following Advanced Practice Providers on your designated Care Team:   Murray Hodgkins, NP Christell Faith, PA-C Marrianne Mood, PA-C Cadence Kathlen Mody, [place]   Other Instructions

## 2021-10-12 ENCOUNTER — Other Ambulatory Visit: Payer: Self-pay

## 2021-10-12 DIAGNOSIS — C50919 Malignant neoplasm of unspecified site of unspecified female breast: Secondary | ICD-10-CM

## 2021-10-14 ENCOUNTER — Other Ambulatory Visit: Payer: Self-pay | Admitting: Oncology

## 2021-10-14 ENCOUNTER — Inpatient Hospital Stay: Payer: Medicare HMO | Attending: Oncology

## 2021-10-14 ENCOUNTER — Other Ambulatory Visit: Payer: Self-pay

## 2021-10-14 ENCOUNTER — Inpatient Hospital Stay: Payer: Medicare HMO

## 2021-10-14 ENCOUNTER — Ambulatory Visit
Admission: RE | Admit: 2021-10-14 | Discharge: 2021-10-14 | Disposition: A | Discharge status: Home / Self Care | Payer: Medicare HMO | Source: Ambulatory Visit | Attending: Oncology | Admitting: Oncology

## 2021-10-14 DIAGNOSIS — Z5111 Encounter for antineoplastic chemotherapy: Secondary | ICD-10-CM | POA: Insufficient documentation

## 2021-10-14 DIAGNOSIS — Z79899 Other long term (current) drug therapy: Secondary | ICD-10-CM | POA: Insufficient documentation

## 2021-10-14 DIAGNOSIS — C7951 Secondary malignant neoplasm of bone: Secondary | ICD-10-CM | POA: Diagnosis present

## 2021-10-14 DIAGNOSIS — Z9013 Acquired absence of bilateral breasts and nipples: Secondary | ICD-10-CM | POA: Insufficient documentation

## 2021-10-14 DIAGNOSIS — C50919 Malignant neoplasm of unspecified site of unspecified female breast: Secondary | ICD-10-CM | POA: Diagnosis present

## 2021-10-14 DIAGNOSIS — C50411 Malignant neoplasm of upper-outer quadrant of right female breast: Secondary | ICD-10-CM | POA: Insufficient documentation

## 2021-10-14 DIAGNOSIS — Z170 Estrogen receptor positive status [ER+]: Secondary | ICD-10-CM

## 2021-10-14 LAB — CBC WITH DIFFERENTIAL/PLATELET
Abs Immature Granulocytes: 0.02 10*3/uL (ref 0.00–0.07)
Basophils Absolute: 0.1 10*3/uL (ref 0.0–0.1)
Basophils Relative: 1 %
Eosinophils Absolute: 0.1 10*3/uL (ref 0.0–0.5)
Eosinophils Relative: 1 %
HCT: 32.6 % — ABNORMAL LOW (ref 36.0–46.0)
Hemoglobin: 11 g/dL — ABNORMAL LOW (ref 12.0–15.0)
Immature Granulocytes: 0 %
Lymphocytes Relative: 29 %
Lymphs Abs: 2.3 10*3/uL (ref 0.7–4.0)
MCH: 30.5 pg (ref 26.0–34.0)
MCHC: 33.7 g/dL (ref 30.0–36.0)
MCV: 90.3 fL (ref 80.0–100.0)
Monocytes Absolute: 0.6 10*3/uL (ref 0.1–1.0)
Monocytes Relative: 7 %
Neutro Abs: 4.9 10*3/uL (ref 1.7–7.7)
Neutrophils Relative %: 62 %
Platelets: 272 10*3/uL (ref 150–400)
RBC: 3.61 MIL/uL — ABNORMAL LOW (ref 3.87–5.11)
RDW: 13.2 % (ref 11.5–15.5)
WBC: 7.9 10*3/uL (ref 4.0–10.5)
nRBC: 0 % (ref 0.0–0.2)

## 2021-10-14 LAB — COMPREHENSIVE METABOLIC PANEL
ALT: 20 U/L (ref 0–44)
AST: 27 U/L (ref 15–41)
Albumin: 4.2 g/dL (ref 3.5–5.0)
Alkaline Phosphatase: 67 U/L (ref 38–126)
Anion gap: 10 (ref 5–15)
BUN: 20 mg/dL (ref 6–20)
CO2: 22 mmol/L (ref 22–32)
Calcium: 8.6 mg/dL — ABNORMAL LOW (ref 8.9–10.3)
Chloride: 105 mmol/L (ref 98–111)
Creatinine, Ser: 0.93 mg/dL (ref 0.44–1.00)
GFR, Estimated: >60 mL/min (ref >60)
Glucose, Bld: 137 mg/dL — ABNORMAL HIGH (ref 70–99)
Potassium: 3.6 mmol/L (ref 3.5–5.1)
Sodium: 137 mmol/L (ref 135–145)
Total Bilirubin: 0.3 mg/dL (ref 0.3–1.2)
Total Protein: 7.5 g/dL (ref 6.5–8.1)

## 2021-10-14 LAB — GLUCOSE, CAPILLARY: Glucose-Capillary: 90 mg/dL (ref 70–99)

## 2021-10-14 LAB — MAGNESIUM: Magnesium: 2.3 mg/dL (ref 1.7–2.4)

## 2021-10-14 IMAGING — PT NM PET TUM IMG RESTAG (PS) SKULL BASE T - THIGH
1 of 9 series · 1 of 25 positions shown · non-contrast
Comparison: [DATE]

CLINICAL DATA: Subsequent treatment strategy for breast cancer.

EXAM:
NUCLEAR MEDICINE PET SKULL BASE TO THIGH
TECHNIQUE: 9.69 mCi F-18 FDG was injected intravenously. Full-ring PET imaging
was performed from the skull base to thigh after the radiotracer. CT
data was obtained and used for attenuation correction and anatomic
localization.
Fasting blood glucose: 90 mg/dl

[Series 3: ct wb 5.0 b30f · axial · 5.0mm · 0.98mm/px · 1 of 290 slices shown]
[im 290/290  brain]
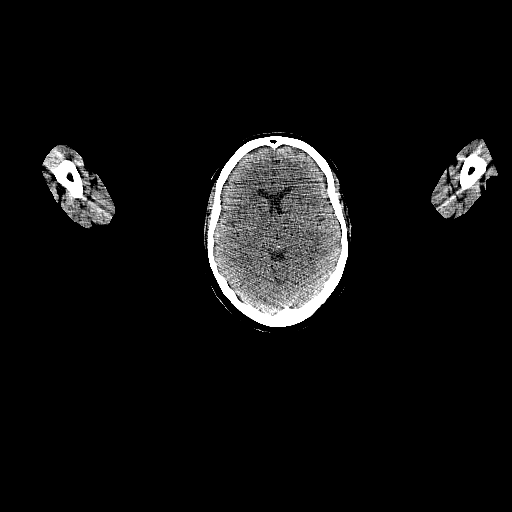

[1 of 25 positions shown; findings below may reference images not displayed]

FINDINGS: Mediastinal blood pool activity: SUV max

Liver activity: SUV max NA

NECK: No hypermetabolic lymph nodes in the neck.

Incidental CT findings: none

CHEST: No hypermetabolic mediastinal or hilar nodes. No suspicious
pulmonary nodules on the CT scan.

Incidental CT findings: Post bilateral mastectomy with RIGHT
axillary dissection. RIGHT-sided Port-A-Cath terminates in the RIGHT
atrium as before. No consolidation or effusion. Airways are patent.
Noncontrast appearance of the heart and great vessels is
unremarkable.

ABDOMEN/PELVIS: No abnormal hypermetabolic activity within the
liver, pancreas, adrenal glands, or spleen. No hypermetabolic lymph
nodes in the abdomen or pelvis.

Incidental CT findings: none

SKELETON: No focal hypermetabolic activity to suggest skeletal
metastasis.

Incidental CT findings: none
IMPRESSION: Post bilateral mastectomy and RIGHT axillary dissection without
signs of recurrent or metastatic disease.

## 2021-10-14 MED ORDER — FLUDEOXYGLUCOSE F - 18 (FDG) INJECTION
9.6900 | Freq: Once | INTRAVENOUS | Status: AC | PRN
  Administered 2021-10-14: 9.69 via INTRAVENOUS

## 2021-10-14 MED ORDER — ZOLEDRONIC ACID 4 MG/100ML IV SOLN
4.0000 mg | Freq: Once | INTRAVENOUS | Status: AC
  Administered 2021-10-14: 4 mg via INTRAVENOUS
  Filled 2021-10-14: qty 100

## 2021-10-14 MED ORDER — FULVESTRANT 250 MG/5ML IM SOSY
500.0000 mg | PREFILLED_SYRINGE | INTRAMUSCULAR | Status: DC
  Administered 2021-10-14: 500 mg via INTRAMUSCULAR

## 2021-10-14 MED ORDER — HEPARIN SOD (PORK) LOCK FLUSH 100 UNIT/ML IV SOLN
INTRAVENOUS | Status: AC
  Administered 2021-10-14: 500 [IU]
  Filled 2021-10-14: qty 5

## 2021-10-14 MED ORDER — SODIUM CHLORIDE 0.9 % IV SOLN
INTRAVENOUS | Status: DC
  Filled 2021-10-14: qty 250

## 2021-10-14 MED ORDER — SODIUM CHLORIDE 0.9% FLUSH
10.0000 mL | Freq: Once | INTRAVENOUS | Status: AC
Start: 2021-10-14 — End: 2021-10-14
  Administered 2021-10-14: 10 mL
  Filled 2021-10-14: qty 10

## 2021-10-14 NOTE — Patient Instructions (Signed)
Fulvestrant injection What is this medication? FULVESTRANT (ful VES trant) blocks the effects of estrogen. It is used to treat breast cancer. This medicine may be used for other purposes; ask your health care provider or pharmacist if you have questions. COMMON BRAND NAME(S): FASLODEX What should I tell my care team before I take this medication? They need to know if you have any of these conditions: bleeding disorders liver disease low blood counts, like low white cell, platelet, or red cell counts an unusual or allergic reaction to fulvestrant, other medicines, foods, dyes, or preservatives pregnant or trying to get pregnant breast-feeding How should I use this medication? This medicine is for injection into a muscle. It is usually given by a health care professional in a hospital or clinic setting. Talk to your pediatrician regarding the use of this medicine in children. Special care may be needed. Overdosage: If you think you have taken too much of this medicine contact a poison control center or emergency room at once. NOTE: This medicine is only for you. Do not share this medicine with others. What if I miss a dose? It is important not to miss your dose. Call your doctor or health care professional if you are unable to keep an appointment. What may interact with this medication? medicines that treat or prevent blood clots like warfarin, enoxaparin, dalteparin, apixaban, dabigatran, and rivaroxaban This list may not describe all possible interactions. Give your health care provider a list of all the medicines, herbs, non-prescription drugs, or dietary supplements you use. Also tell them if you smoke, drink alcohol, or use illegal drugs. Some items may interact with your medicine. What should I watch for while using this medication? Your condition will be monitored carefully while you are receiving this medicine. You will need important blood work done while you are taking this  medicine. Do not become pregnant while taking this medicine or for at least 1 year after stopping it. Women of child-bearing potential will need to have a negative pregnancy test before starting this medicine. Women should inform their doctor if they wish to become pregnant or think they might be pregnant. There is a potential for serious side effects to an unborn child. Men should inform their doctors if they wish to father a child. This medicine may lower sperm counts. Talk to your health care professional or pharmacist for more information. Do not breast-feed an infant while taking this medicine or for 1 year after the last dose. What side effects may I notice from receiving this medication? Side effects that you should report to your doctor or health care professional as soon as possible: allergic reactions like skin rash, itching or hives, swelling of the face, lips, or tongue feeling faint or lightheaded, falls pain, tingling, numbness, or weakness in the legs signs and symptoms of infection like fever or chills; cough; flu-like symptoms; sore throat vaginal bleeding Side effects that usually do not require medical attention (report to your doctor or health care professional if they continue or are bothersome): aches, pains constipation diarrhea headache hot flashes nausea, vomiting pain at site where injected stomach pain This list may not describe all possible side effects. Call your doctor for medical advice about side effects. You may report side effects to FDA at 1-800-FDA-1088. Where should I keep my medication? This drug is given in a hospital or clinic and will not be stored at home. NOTE: This sheet is a summary. It may not cover all possible information. If you have   questions about this medicine, talk to your doctor, pharmacist, or health care provider.  2022 Elsevier/Gold Standard (2018-03-08 11:34:41) Zoledronic Acid Injection (Hypercalcemia, Oncology) What is this  medication? ZOLEDRONIC ACID (ZOE le dron ik AS id) slows calcium loss from bones. It high calcium levels in the blood from some kinds of cancer. It may be used in other people at risk for bone loss. This medicine may be used for other purposes; ask your health care provider or pharmacist if you have questions. COMMON BRAND NAME(S): Zometa What should I tell my care team before I take this medication? They need to know if you have any of these conditions: cancer dehydration dental disease kidney disease liver disease low levels of calcium in the blood lung or breathing disease (asthma) receiving steroids like dexamethasone or prednisone an unusual or allergic reaction to zoledronic acid, other medicines, foods, dyes, or preservatives pregnant or trying to get pregnant breast-feeding How should I use this medication? This drug is injected into a vein. It is given by a health care provider in a hospital or clinic setting. Talk to your health care provider about the use of this drug in children. Special care may be needed. Overdosage: If you think you have taken too much of this medicine contact a poison control center or emergency room at once. NOTE: This medicine is only for you. Do not share this medicine with others. What if I miss a dose? Keep appointments for follow-up doses. It is important not to miss your dose. Call your health care provider if you are unable to keep an appointment. What may interact with this medication? certain antibiotics given by injection NSAIDs, medicines for pain and inflammation, like ibuprofen or naproxen some diuretics like bumetanide, furosemide teriparatide thalidomide This list may not describe all possible interactions. Give your health care provider a list of all the medicines, herbs, non-prescription drugs, or dietary supplements you use. Also tell them if you smoke, drink alcohol, or use illegal drugs. Some items may interact with your  medicine. What should I watch for while using this medication? Visit your health care provider for regular checks on your progress. It may be some time before you see the benefit from this drug. Some people who take this drug have severe bone, joint, or muscle pain. This drug may also increase your risk for jaw problems or a broken thigh bone. Tell your health care provider right away if you have severe pain in your jaw, bones, joints, or muscles. Tell you health care provider if you have any pain that does not go away or that gets worse. Tell your dentist and dental surgeon that you are taking this drug. You should not have major dental surgery while on this drug. See your dentist to have a dental exam and fix any dental problems before starting this drug. Take good care of your teeth while on this drug. Make sure you see your dentist for regular follow-up appointments. You should make sure you get enough calcium and vitamin D while you are taking this drug. Discuss the foods you eat and the vitamins you take with your health care provider. Check with your health care provider if you have severe diarrhea, nausea, and vomiting, or if you sweat a lot. The loss of too much body fluid may make it dangerous for you to take this drug. You may need blood work done while you are taking this drug. Do not become pregnant while taking this drug. Women should inform their   health care provider if they wish to become pregnant or think they might be pregnant. There is potential for serious harm to an unborn child. Talk to your health care provider for more information. What side effects may I notice from receiving this medication? Side effects that you should report to your doctor or health care provider as soon as possible: allergic reactions (skin rash, itching or hives; swelling of the face, lips, or tongue) bone pain infection (fever, chills, cough, sore throat, pain or trouble passing urine) jaw pain,  especially after dental work joint pain kidney injury (trouble passing urine or change in the amount of urine) low blood pressure (dizziness; feeling faint or lightheaded, falls; unusually weak or tired) low calcium levels (fast heartbeat; muscle cramps or pain; pain, tingling, or numbness in the hands or feet; seizures) low magnesium levels (fast, irregular heartbeat; muscle cramp or pain; muscle weakness; tremors; seizures) low red blood cell counts (trouble breathing; feeling faint; lightheaded, falls; unusually weak or tired) muscle pain redness, blistering, peeling, or loosening of the skin, including inside the mouth severe diarrhea swelling of the ankles, feet, hands trouble breathing Side effects that usually do not require medical attention (report to your doctor or health care provider if they continue or are bothersome): anxious constipation coughing depressed mood eye irritation, itching, or pain fever general ill feeling or flu-like symptoms nausea pain, redness, or irritation at site where injected trouble sleeping This list may not describe all possible side effects. Call your doctor for medical advice about side effects. You may report side effects to FDA at 1-800-FDA-1088. Where should I keep my medication? This drug is given in a hospital or clinic. It will not be stored at home. NOTE: This sheet is a summary. It may not cover all possible information. If you have questions about this medicine, talk to your doctor, pharmacist, or health care provider.  2022 Elsevier/Gold Standard (2019-09-12 09:13:00)  

## 2021-10-20 ENCOUNTER — Encounter: Payer: Self-pay | Admitting: Oncology

## 2021-10-20 NOTE — Telephone Encounter (Signed)
Last bone density was on 08/23/2018. Does she need to be scheduled for another one? Please advise

## 2021-10-22 ENCOUNTER — Other Ambulatory Visit: Payer: Self-pay

## 2021-10-22 DIAGNOSIS — C50919 Malignant neoplasm of unspecified site of unspecified female breast: Secondary | ICD-10-CM

## 2021-10-22 DIAGNOSIS — C7951 Secondary malignant neoplasm of bone: Secondary | ICD-10-CM

## 2021-10-25 ENCOUNTER — Other Ambulatory Visit: Payer: Medicare HMO

## 2021-11-08 ENCOUNTER — Ambulatory Visit
Admission: RE | Admit: 2021-11-08 | Discharge: 2021-11-08 | Disposition: A | Discharge status: Home / Self Care | Payer: Medicare HMO | Source: Ambulatory Visit | Attending: Oncology | Admitting: Oncology

## 2021-11-08 ENCOUNTER — Other Ambulatory Visit: Payer: Self-pay

## 2021-11-08 DIAGNOSIS — Z780 Asymptomatic menopausal state: Secondary | ICD-10-CM | POA: Diagnosis not present

## 2021-11-08 DIAGNOSIS — Z13820 Encounter for screening for osteoporosis: Secondary | ICD-10-CM | POA: Diagnosis not present

## 2021-11-08 DIAGNOSIS — Z9221 Personal history of antineoplastic chemotherapy: Secondary | ICD-10-CM | POA: Diagnosis not present

## 2021-11-08 DIAGNOSIS — C7951 Secondary malignant neoplasm of bone: Secondary | ICD-10-CM | POA: Insufficient documentation

## 2021-11-08 DIAGNOSIS — C50919 Malignant neoplasm of unspecified site of unspecified female breast: Secondary | ICD-10-CM | POA: Insufficient documentation

## 2021-11-08 DIAGNOSIS — Z923 Personal history of irradiation: Secondary | ICD-10-CM | POA: Insufficient documentation

## 2021-11-11 ENCOUNTER — Other Ambulatory Visit: Payer: Medicare HMO

## 2021-11-11 ENCOUNTER — Ambulatory Visit (INDEPENDENT_AMBULATORY_CARE_PROVIDER_SITE_OTHER): Payer: Medicare HMO

## 2021-11-11 ENCOUNTER — Telehealth: Payer: Self-pay | Admitting: Oncology

## 2021-11-11 ENCOUNTER — Other Ambulatory Visit: Payer: Self-pay

## 2021-11-11 DIAGNOSIS — I428 Other cardiomyopathies: Secondary | ICD-10-CM | POA: Diagnosis not present

## 2021-11-11 LAB — ECHOCARDIOGRAM LIMITED
Calc EF: 46.8 %
S' Lateral: 2.1 cm
Single Plane A2C EF: 49.4 %
Single Plane A4C EF: 44.4 %

## 2021-11-11 MED ORDER — PERFLUTREN LIPID MICROSPHERE
1.0000 mL | INTRAVENOUS | Status: AC | PRN
  Administered 2021-11-11: 2 mL via INTRAVENOUS

## 2021-11-11 NOTE — Telephone Encounter (Signed)
Pt called in to cancel her appt. Please give her a call back at [phone]

## 2021-11-12 NOTE — Telephone Encounter (Signed)
Will you please reach out to pt to r/s appts, per her request. IF appts on Monday are rescheduled, other lab/Fulvestrant/ zometa appts will need to be r/s as well to be at least 28 days apart.   Dr. Tasia Catchings, is there a follow up plan for pt, she is not scheduled to see MD.

## 2021-11-15 ENCOUNTER — Inpatient Hospital Stay: Payer: Medicare HMO

## 2021-11-15 ENCOUNTER — Other Ambulatory Visit: Payer: Self-pay | Admitting: *Deleted

## 2021-11-15 ENCOUNTER — Other Ambulatory Visit: Payer: Self-pay

## 2021-11-15 ENCOUNTER — Telehealth: Payer: Self-pay | Admitting: *Deleted

## 2021-11-15 ENCOUNTER — Inpatient Hospital Stay: Payer: Medicare HMO | Attending: Oncology

## 2021-11-15 VITALS — BP 110/92 | HR 96 | Temp 96.8°F | Resp 18

## 2021-11-15 DIAGNOSIS — C50919 Malignant neoplasm of unspecified site of unspecified female breast: Secondary | ICD-10-CM

## 2021-11-15 DIAGNOSIS — E876 Hypokalemia: Secondary | ICD-10-CM

## 2021-11-15 DIAGNOSIS — Z79899 Other long term (current) drug therapy: Secondary | ICD-10-CM | POA: Diagnosis not present

## 2021-11-15 DIAGNOSIS — I427 Cardiomyopathy due to drug and external agent: Secondary | ICD-10-CM

## 2021-11-15 DIAGNOSIS — T451X5A Adverse effect of antineoplastic and immunosuppressive drugs, initial encounter: Secondary | ICD-10-CM

## 2021-11-15 DIAGNOSIS — C50411 Malignant neoplasm of upper-outer quadrant of right female breast: Secondary | ICD-10-CM | POA: Insufficient documentation

## 2021-11-15 DIAGNOSIS — I428 Other cardiomyopathies: Secondary | ICD-10-CM

## 2021-11-15 DIAGNOSIS — Z170 Estrogen receptor positive status [ER+]: Secondary | ICD-10-CM

## 2021-11-15 DIAGNOSIS — Z5111 Encounter for antineoplastic chemotherapy: Secondary | ICD-10-CM | POA: Diagnosis not present

## 2021-11-15 DIAGNOSIS — C7951 Secondary malignant neoplasm of bone: Secondary | ICD-10-CM | POA: Diagnosis present

## 2021-11-15 LAB — COMPREHENSIVE METABOLIC PANEL
ALT: 20 U/L (ref 0–44)
AST: 25 U/L (ref 15–41)
Albumin: 4.4 g/dL (ref 3.5–5.0)
Alkaline Phosphatase: 66 U/L (ref 38–126)
Anion gap: 11 (ref 5–15)
BUN: 17 mg/dL (ref 6–20)
CO2: 22 mmol/L (ref 22–32)
Calcium: 9.4 mg/dL (ref 8.9–10.3)
Chloride: 103 mmol/L (ref 98–111)
Creatinine, Ser: 0.85 mg/dL (ref 0.44–1.00)
GFR, Estimated: >60 mL/min (ref >60)
Glucose, Bld: 118 mg/dL — ABNORMAL HIGH (ref 70–99)
Potassium: 3.4 mmol/L — ABNORMAL LOW (ref 3.5–5.1)
Sodium: 136 mmol/L (ref 135–145)
Total Bilirubin: 0.4 mg/dL (ref 0.3–1.2)
Total Protein: 7.6 g/dL (ref 6.5–8.1)

## 2021-11-15 LAB — CBC WITH DIFFERENTIAL/PLATELET
Abs Immature Granulocytes: 0.03 10*3/uL (ref 0.00–0.07)
Basophils Absolute: 0 10*3/uL (ref 0.0–0.1)
Basophils Relative: 0 %
Eosinophils Absolute: 0.1 10*3/uL (ref 0.0–0.5)
Eosinophils Relative: 1 %
HCT: 33.2 % — ABNORMAL LOW (ref 36.0–46.0)
Hemoglobin: 11.1 g/dL — ABNORMAL LOW (ref 12.0–15.0)
Immature Granulocytes: 0 %
Lymphocytes Relative: 27 %
Lymphs Abs: 2.1 10*3/uL (ref 0.7–4.0)
MCH: 30.1 pg (ref 26.0–34.0)
MCHC: 33.4 g/dL (ref 30.0–36.0)
MCV: 90 fL (ref 80.0–100.0)
Monocytes Absolute: 0.6 10*3/uL (ref 0.1–1.0)
Monocytes Relative: 8 %
Neutro Abs: 4.8 10*3/uL (ref 1.7–7.7)
Neutrophils Relative %: 64 %
Platelets: 290 10*3/uL (ref 150–400)
RBC: 3.69 MIL/uL — ABNORMAL LOW (ref 3.87–5.11)
RDW: 13.3 % (ref 11.5–15.5)
WBC: 7.7 10*3/uL (ref 4.0–10.5)
nRBC: 0 % (ref 0.0–0.2)

## 2021-11-15 LAB — MAGNESIUM: Magnesium: 2 mg/dL (ref 1.7–2.4)

## 2021-11-15 MED ORDER — FULVESTRANT 250 MG/5ML IM SOSY
500.0000 mg | PREFILLED_SYRINGE | INTRAMUSCULAR | Status: DC
  Administered 2021-11-15: 500 mg via INTRAMUSCULAR
  Filled 2021-11-15: qty 10

## 2021-11-15 MED ORDER — LOSARTAN POTASSIUM 25 MG PO TABS: 12.5000 mg | ORAL_TABLET | Freq: Every day | ORAL | 3 refills | Status: DC

## 2021-11-15 MED ORDER — METOPROLOL SUCCINATE ER 25 MG PO TB24: 12.5000 mg | ORAL_TABLET | Freq: Every day | ORAL | 3 refills | Status: DC

## 2021-11-15 MED ORDER — ZOLEDRONIC ACID 4 MG/100ML IV SOLN
4.0000 mg | Freq: Once | INTRAVENOUS | Status: AC
  Administered 2021-11-15: 4 mg via INTRAVENOUS
  Filled 2021-11-15: qty 100

## 2021-11-15 MED ORDER — SODIUM CHLORIDE 0.9% FLUSH
10.0000 mL | INTRAVENOUS | Status: DC | PRN
  Administered 2021-11-15: 10 mL via INTRAVENOUS
  Filled 2021-11-15: qty 10

## 2021-11-15 MED ORDER — HEPARIN SOD (PORK) LOCK FLUSH 100 UNIT/ML IV SOLN
500.0000 [IU] | Freq: Once | INTRAVENOUS | Status: AC
  Administered 2021-11-15: 500 [IU] via INTRAVENOUS
  Filled 2021-11-15: qty 5

## 2021-11-15 NOTE — Patient Instructions (Signed)
[facility] AT [place]  Discharge Instructions: Thank you for choosing [facility] to provide your oncology and hematology care.  If you have a lab appointment with the [facility], please go directly to the [facility] and check in at the registration area.  Wear comfortable clothing and clothing appropriate for easy access to any Portacath or PICC line.   We strive to give you quality time with your provider. You may need to reschedule your appointment if you arrive late (15 or more minutes).  Arriving late affects you and other patients whose appointments are after yours.  Also, if you miss three or more appointments without notifying the office, you may be dismissed from the clinic at the provider's discretion.      For prescription refill requests, have your pharmacy contact our office and allow 72 hours for refills to be completed.    Today you received the following chemotherapy and/or immunotherapy agents zometa, faslodex      To help prevent nausea and vomiting after your treatment, we encourage you to take your nausea medication as directed.  BELOW ARE SYMPTOMS THAT SHOULD BE REPORTED IMMEDIATELY: *FEVER GREATER THAN 100.4 F (38 C) OR HIGHER *CHILLS OR SWEATING *NAUSEA AND VOMITING THAT IS NOT CONTROLLED WITH YOUR NAUSEA MEDICATION *UNUSUAL SHORTNESS OF BREATH *UNUSUAL BRUISING OR BLEEDING *URINARY PROBLEMS (pain or burning when urinating, or frequent urination) *BOWEL PROBLEMS (unusual diarrhea, constipation, pain near the anus) TENDERNESS IN MOUTH AND THROAT WITH OR WITHOUT PRESENCE OF ULCERS (sore throat, sores in mouth, or a toothache) UNUSUAL RASH, SWELLING OR PAIN  UNUSUAL VAGINAL DISCHARGE OR ITCHING   Items with * indicate a potential emergency and should be followed up as soon as possible or go to the Emergency Department if any problems should occur.  Please show the CHEMOTHERAPY ALERT CARD or IMMUNOTHERAPY ALERT CARD at  check-in to the Emergency Department and triage nurse.  Should you have questions after your visit or need to cancel or reschedule your appointment, please contact [facility] [place] AT [place]  [phone] and follow the prompts.  Office hours are 8:00 a.m. to 4:30 p.m. Monday - Friday. Please note that voicemails left after 4:00 p.m. may not be returned until the following business day.  We are closed weekends and major holidays. You have access to a nurse at all times for urgent questions. Please call the main number to the clinic [phone] and follow the prompts.  For any non-urgent questions, you may also contact your provider using MyChart. We now offer e-Visits for anyone 73 and older to request care online for non-urgent symptoms. For details visit [email or web address].   Also download the MyChart app! Go to the app store, search "MyChart", open the app, select [facility], and log in with your MyChart username and password.  Due to Covid, a mask is required upon entering the hospital/clinic. If you do not have a mask, one will be given to you upon arrival. For doctor visits, patients may have 1 support person aged 49 or older with them. For treatment visits, patients cannot have anyone with them due to current Covid guidelines and our immunocompromised population.   Zoledronic Acid Injection (Hypercalcemia, Oncology) What is this medication? ZOLEDRONIC ACID (ZOE le dron ik AS id) slows calcium loss from bones. It high calcium levels in the blood from some kinds of cancer. It may be used in other people at risk for bone loss. This medicine may be used for other purposes;  ask your health care provider or pharmacist if you have questions. COMMON BRAND NAME(S): Zometa What should I tell my care team before I take this medication? They need to know if you have any of these conditions: cancer dehydration dental disease kidney disease liver disease low levels of calcium  in the blood lung or breathing disease (asthma) receiving steroids like dexamethasone or prednisone an unusual or allergic reaction to zoledronic acid, other medicines, foods, dyes, or preservatives pregnant or trying to get pregnant breast-feeding How should I use this medication? This drug is injected into a vein. It is given by a health care provider in a hospital or clinic setting. Talk to your health care provider about the use of this drug in children. Special care may be needed. Overdosage: If you think you have taken too much of this medicine contact a poison control center or emergency room at once. NOTE: This medicine is only for you. Do not share this medicine with others. What if I miss a dose? Keep appointments for follow-up doses. It is important not to miss your dose. Call your health care provider if you are unable to keep an appointment. What may interact with this medication? certain antibiotics given by injection NSAIDs, medicines for pain and inflammation, like ibuprofen or naproxen some diuretics like bumetanide, furosemide teriparatide thalidomide This list may not describe all possible interactions. Give your health care provider a list of all the medicines, herbs, non-prescription drugs, or dietary supplements you use. Also tell them if you smoke, drink alcohol, or use illegal drugs. Some items may interact with your medicine. What should I watch for while using this medication? Visit your health care provider for regular checks on your progress. It may be some time before you see the benefit from this drug. Some people who take this drug have severe bone, joint, or muscle pain. This drug may also increase your risk for jaw problems or a broken thigh bone. Tell your health care provider right away if you have severe pain in your jaw, bones, joints, or muscles. Tell you health care provider if you have any pain that does not go away or that gets worse. Tell your dentist  and dental surgeon that you are taking this drug. You should not have major dental surgery while on this drug. See your dentist to have a dental exam and fix any dental problems before starting this drug. Take good care of your teeth while on this drug. Make sure you see your dentist for regular follow-up appointments. You should make sure you get enough calcium and vitamin D while you are taking this drug. Discuss the foods you eat and the vitamins you take with your health care provider. Check with your health care provider if you have severe diarrhea, nausea, and vomiting, or if you sweat a lot. The loss of too much body fluid may make it dangerous for you to take this drug. You may need blood work done while you are taking this drug. Do not become pregnant while taking this drug. Women should inform their health care provider if they wish to become pregnant or think they might be pregnant. There is potential for serious harm to an unborn child. Talk to your health care provider for more information. What side effects may I notice from receiving this medication? Side effects that you should report to your doctor or health care provider as soon as possible: allergic reactions (skin rash, itching or hives; swelling of the face, lips,  or tongue) bone pain infection (fever, chills, cough, sore throat, pain or trouble passing urine) jaw pain, especially after dental work joint pain kidney injury (trouble passing urine or change in the amount of urine) low blood pressure (dizziness; feeling faint or lightheaded, falls; unusually weak or tired) low calcium levels (fast heartbeat; muscle cramps or pain; pain, tingling, or numbness in the hands or feet; seizures) low magnesium levels (fast, irregular heartbeat; muscle cramp or pain; muscle weakness; tremors; seizures) low red blood cell counts (trouble breathing; feeling faint; lightheaded, falls; unusually weak or tired) muscle pain redness, blistering,  peeling, or loosening of the skin, including inside the mouth severe diarrhea swelling of the ankles, feet, hands trouble breathing Side effects that usually do not require medical attention (report to your doctor or health care provider if they continue or are bothersome): anxious constipation coughing depressed mood eye irritation, itching, or pain fever general ill feeling or flu-like symptoms nausea pain, redness, or irritation at site where injected trouble sleeping This list may not describe all possible side effects. Call your doctor for medical advice about side effects. You may report side effects to FDA at 1-800-FDA-1088. Where should I keep my medication? This drug is given in a hospital or clinic. It will not be stored at home. NOTE: This sheet is a summary. It may not cover all possible information. If you have questions about this medicine, talk to your doctor, pharmacist, or health care provider.  2022 Elsevier/Gold Standard (2021-08-17 00:00:00)  Fulvestrant injection What is this medication? FULVESTRANT (ful VES trant) blocks the effects of estrogen. It is used to treat breast cancer. This medicine may be used for other purposes; ask your health care provider or pharmacist if you have questions. COMMON BRAND NAME(S): FASLODEX What should I tell my care team before I take this medication? They need to know if you have any of these conditions: bleeding disorders liver disease low blood counts, like low white cell, platelet, or red cell counts an unusual or allergic reaction to fulvestrant, other medicines, foods, dyes, or preservatives pregnant or trying to get pregnant breast-feeding How should I use this medication? This medicine is for injection into a muscle. It is usually given by a health care professional in a hospital or clinic setting. Talk to your pediatrician regarding the use of this medicine in children. Special care may be needed. Overdosage: If you  think you have taken too much of this medicine contact a poison control center or emergency room at once. NOTE: This medicine is only for you. Do not share this medicine with others. What if I miss a dose? It is important not to miss your dose. Call your doctor or health care professional if you are unable to keep an appointment. What may interact with this medication? medicines that treat or prevent blood clots like warfarin, enoxaparin, dalteparin, apixaban, dabigatran, and rivaroxaban This list may not describe all possible interactions. Give your health care provider a list of all the medicines, herbs, non-prescription drugs, or dietary supplements you use. Also tell them if you smoke, drink alcohol, or use illegal drugs. Some items may interact with your medicine. What should I watch for while using this medication? Your condition will be monitored carefully while you are receiving this medicine. You will need important blood work done while you are taking this medicine. Do not become pregnant while taking this medicine or for at least 1 year after stopping it. Women of child-bearing potential will need to have a  negative pregnancy test before starting this medicine. Women should inform their doctor if they wish to become pregnant or think they might be pregnant. There is a potential for serious side effects to an unborn child. Men should inform their doctors if they wish to father a child. This medicine may lower sperm counts. Talk to your health care professional or pharmacist for more information. Do not breast-feed an infant while taking this medicine or for 1 year after the last dose. What side effects may I notice from receiving this medication? Side effects that you should report to your doctor or health care professional as soon as possible: allergic reactions like skin rash, itching or hives, swelling of the face, lips, or tongue feeling faint or lightheaded, falls pain, tingling,  numbness, or weakness in the legs signs and symptoms of infection like fever or chills; cough; flu-like symptoms; sore throat vaginal bleeding Side effects that usually do not require medical attention (report to your doctor or health care professional if they continue or are bothersome): aches, pains constipation diarrhea headache hot flashes nausea, vomiting pain at site where injected stomach pain This list may not describe all possible side effects. Call your doctor for medical advice about side effects. You may report side effects to FDA at 1-800-FDA-1088. Where should I keep my medication? This drug is given in a hospital or clinic and will not be stored at home. NOTE: This sheet is a summary. It may not cover all possible information. If you have questions about this medicine, talk to your doctor, pharmacist, or health care provider.  2022 Elsevier/Gold Standard (2018-03-13 00:00:00)

## 2021-11-15 NOTE — Telephone Encounter (Signed)
-----   Message from Rise Mu, [place] sent at 11/15/2021 12:13 PM EST ----- If she is feeling well, can postpone the appointment until after follow up echo in 3 months. I would like a BMET 1 week after starting losartan.  ----- Message ----- From: Valora Corporal, RN Sent: 11/15/2021  12:08 PM EST To: Rise Mu, PA-C  She has appt on Friday with you. She did ask if it was needed or wait for 3 months?   Thanks ----- Message ----- From: Sindy Messing Sent: 11/12/2021   8:56 AM EST To: Valora Corporal, RN  Please inform patient echo showed a slight improvement in her pump function with EF now 45 to 50%.  Recommendations: -Please verify if she remains on Toprol-XL 37.5 mg daily, if so, recommend she decrease this to 12.5 mg daily with plan to start losartan 12.5 mg daily (patient has a history of bilateral salpingo-oophorectomy) -Recheck limited echo on low-dose Toprol and losartan in 3 months -Discussed with primary cardiologist

## 2021-11-15 NOTE — Telephone Encounter (Signed)
Pt had echo on 12/1, cardiology appt on 12/9.

## 2021-11-15 NOTE — Telephone Encounter (Signed)
Reviewed provider recommendations that we can postpone her appointment on Friday if she is feeling well. Advised I would have scheduling call her to arrange a 3 month follow up (after echo) and 3 month limited echocardiogram. Will send to scheduling to assist with these appointments. Patient verbalized understanding of plan, was agreeable, lab scheduled, and now sending to scheduling for further help with other appointments.

## 2021-11-19 ENCOUNTER — Ambulatory Visit: Payer: Medicare HMO | Admitting: Physician Assistant

## 2021-11-22 ENCOUNTER — Encounter: Payer: Self-pay | Admitting: Oncology

## 2021-11-22 NOTE — Telephone Encounter (Signed)
Per Cardiology note: Christell Faith PA-C on 10/26:   Chemotherapy-induced systolic dysfunction/NICM: She appears euvolemic and well compensated.  Hopefully, her cardiomyopathy is reversible following the holding of pertuzumab and pertuzumab.  Ideally, would like to start low-dose ACE inhibitor or ARB, however hypotension precludes that at this time.  She does remain on Toprol-XL which will be continued.  In follow-up, if BP allows would recommend addition of low-dose losartan or lisinopril.  We will obtain a limited echo in 1 month to reevaluate her LV systolic function of cardiotoxic medication.  If her cardiomyopathy persists at that time further evaluation will be indicated along with escalation of medical therapy as able.  CHF education.   WCT/paroxysmal SVT/palpitations: Quiescent.  She remains on Toprol-XL.  This was not discussed in detail at today's visit given the above.   Recurrent breast cancer with isolated bone metastasis: Management per oncology.   Disposition: F/u with Dr. Saunders Revel or an APP in approximately 6 weeks.

## 2021-11-23 ENCOUNTER — Telehealth: Payer: Self-pay

## 2021-11-23 NOTE — Telephone Encounter (Signed)
Please schedule patient for Lab/MD/ Fulvestrant/ zometa around 1/4 and notify pt of appt. Keep other appts as scheduled.

## 2021-11-23 NOTE — Telephone Encounter (Signed)
Can you please schedule?

## 2021-11-24 NOTE — Telephone Encounter (Signed)
Please see pt's response. FYI. She is scheduled to see you on 1/4.

## 2021-11-26 ENCOUNTER — Other Ambulatory Visit: Payer: Self-pay

## 2021-11-26 ENCOUNTER — Other Ambulatory Visit (INDEPENDENT_AMBULATORY_CARE_PROVIDER_SITE_OTHER): Payer: Medicare HMO

## 2021-11-26 DIAGNOSIS — T451X5A Adverse effect of antineoplastic and immunosuppressive drugs, initial encounter: Secondary | ICD-10-CM

## 2021-11-26 DIAGNOSIS — E876 Hypokalemia: Secondary | ICD-10-CM

## 2021-11-26 DIAGNOSIS — I428 Other cardiomyopathies: Secondary | ICD-10-CM

## 2021-11-27 ENCOUNTER — Encounter: Payer: Self-pay | Admitting: Oncology

## 2021-11-27 LAB — BASIC METABOLIC PANEL
BUN/Creatinine Ratio: 14 (ref 9–23)
BUN: 12 mg/dL (ref 6–20)
CO2: 22 mmol/L (ref 20–29)
Calcium: 9.6 mg/dL (ref 8.7–10.2)
Chloride: 106 mmol/L (ref 96–106)
Creatinine, Ser: 0.87 mg/dL (ref 0.57–1.00)
Glucose: 110 mg/dL — ABNORMAL HIGH (ref 70–99)
Potassium: 4.1 mmol/L (ref 3.5–5.2)
Sodium: 142 mmol/L (ref 134–144)
eGFR: 87 mL/min/{1.73_m2} (ref >59)

## 2021-12-15 ENCOUNTER — Inpatient Hospital Stay: Payer: Medicare HMO | Admitting: Oncology

## 2021-12-15 ENCOUNTER — Inpatient Hospital Stay (HOSPITAL_BASED_OUTPATIENT_CLINIC_OR_DEPARTMENT_OTHER): Payer: Medicare HMO | Admitting: Oncology

## 2021-12-15 ENCOUNTER — Encounter: Payer: Self-pay | Admitting: Oncology

## 2021-12-15 ENCOUNTER — Inpatient Hospital Stay: Payer: Medicare HMO

## 2021-12-15 ENCOUNTER — Inpatient Hospital Stay: Payer: Medicare HMO | Attending: Oncology

## 2021-12-15 ENCOUNTER — Encounter: Payer: Self-pay | Admitting: Internal Medicine

## 2021-12-15 ENCOUNTER — Other Ambulatory Visit: Payer: Self-pay

## 2021-12-15 VITALS — BP 110/72 | HR 106 | Temp 96.6°F | Wt 182.0 lb

## 2021-12-15 DIAGNOSIS — E876 Hypokalemia: Secondary | ICD-10-CM | POA: Diagnosis not present

## 2021-12-15 DIAGNOSIS — Z95828 Presence of other vascular implants and grafts: Secondary | ICD-10-CM

## 2021-12-15 DIAGNOSIS — Z79811 Long term (current) use of aromatase inhibitors: Secondary | ICD-10-CM | POA: Diagnosis not present

## 2021-12-15 DIAGNOSIS — C7951 Secondary malignant neoplasm of bone: Secondary | ICD-10-CM | POA: Diagnosis present

## 2021-12-15 DIAGNOSIS — C50411 Malignant neoplasm of upper-outer quadrant of right female breast: Secondary | ICD-10-CM | POA: Diagnosis present

## 2021-12-15 DIAGNOSIS — Z5111 Encounter for antineoplastic chemotherapy: Secondary | ICD-10-CM | POA: Diagnosis present

## 2021-12-15 DIAGNOSIS — C50919 Malignant neoplasm of unspecified site of unspecified female breast: Secondary | ICD-10-CM | POA: Diagnosis not present

## 2021-12-15 DIAGNOSIS — Z170 Estrogen receptor positive status [ER+]: Secondary | ICD-10-CM

## 2021-12-15 LAB — CBC WITH DIFFERENTIAL/PLATELET
Abs Immature Granulocytes: 0.02 10*3/uL (ref 0.00–0.07)
Basophils Absolute: 0.1 10*3/uL (ref 0.0–0.1)
Basophils Relative: 1 %
Eosinophils Absolute: 0.1 10*3/uL (ref 0.0–0.5)
Eosinophils Relative: 1 %
HCT: 33.2 % — ABNORMAL LOW (ref 36.0–46.0)
Hemoglobin: 11.2 g/dL — ABNORMAL LOW (ref 12.0–15.0)
Immature Granulocytes: 0 %
Lymphocytes Relative: 26 %
Lymphs Abs: 1.5 10*3/uL (ref 0.7–4.0)
MCH: 30 pg (ref 26.0–34.0)
MCHC: 33.7 g/dL (ref 30.0–36.0)
MCV: 89 fL (ref 80.0–100.0)
Monocytes Absolute: 0.7 10*3/uL (ref 0.1–1.0)
Monocytes Relative: 13 %
Neutro Abs: 3.4 10*3/uL (ref 1.7–7.7)
Neutrophils Relative %: 59 %
Platelets: 305 10*3/uL (ref 150–400)
RBC: 3.73 MIL/uL — ABNORMAL LOW (ref 3.87–5.11)
RDW: 13.5 % (ref 11.5–15.5)
WBC: 5.8 10*3/uL (ref 4.0–10.5)
nRBC: 0 % (ref 0.0–0.2)

## 2021-12-15 LAB — MAGNESIUM: Magnesium: 2 mg/dL (ref 1.7–2.4)

## 2021-12-15 LAB — COMPREHENSIVE METABOLIC PANEL
ALT: 21 U/L (ref 0–44)
AST: 30 U/L (ref 15–41)
Albumin: 4.3 g/dL (ref 3.5–5.0)
Alkaline Phosphatase: 71 U/L (ref 38–126)
Anion gap: 9 (ref 5–15)
BUN: 20 mg/dL (ref 6–20)
CO2: 21 mmol/L — ABNORMAL LOW (ref 22–32)
Calcium: 8.8 mg/dL — ABNORMAL LOW (ref 8.9–10.3)
Chloride: 106 mmol/L (ref 98–111)
Creatinine, Ser: 0.88 mg/dL (ref 0.44–1.00)
GFR, Estimated: >60 mL/min (ref >60)
Glucose, Bld: 124 mg/dL — ABNORMAL HIGH (ref 70–99)
Potassium: 3.4 mmol/L — ABNORMAL LOW (ref 3.5–5.1)
Sodium: 136 mmol/L (ref 135–145)
Total Bilirubin: 0.3 mg/dL (ref 0.3–1.2)
Total Protein: 7.5 g/dL (ref 6.5–8.1)

## 2021-12-15 MED ORDER — FULVESTRANT 250 MG/5ML IM SOSY
500.0000 mg | PREFILLED_SYRINGE | INTRAMUSCULAR | Status: DC
  Administered 2021-12-15: 500 mg via INTRAMUSCULAR
  Filled 2021-12-15: qty 10

## 2021-12-15 MED ORDER — HEPARIN SOD (PORK) LOCK FLUSH 100 UNIT/ML IV SOLN
500.0000 [IU] | Freq: Once | INTRAVENOUS | Status: AC
  Administered 2021-12-15: 500 [IU] via INTRAVENOUS
  Filled 2021-12-15: qty 5

## 2021-12-15 MED ORDER — SODIUM CHLORIDE 0.9 % IV SOLN
INTRAVENOUS | Status: DC
  Filled 2021-12-15: qty 250

## 2021-12-15 MED ORDER — SODIUM CHLORIDE 0.9% FLUSH
10.0000 mL | Freq: Once | INTRAVENOUS | Status: AC
  Administered 2021-12-15: 10 mL via INTRAVENOUS
  Filled 2021-12-15: qty 10

## 2021-12-15 MED ORDER — ZOLEDRONIC ACID 4 MG/100ML IV SOLN
4.0000 mg | Freq: Once | INTRAVENOUS | Status: AC
  Administered 2021-12-15: 4 mg via INTRAVENOUS
  Filled 2021-12-15: qty 100

## 2021-12-15 NOTE — Patient Instructions (Signed)
[facility] AT [place]  Discharge Instructions: Thank you for choosing [facility] to provide your oncology and hematology care.  If you have a lab appointment with the [facility], please go directly to the [facility] and check in at the registration area.  Wear comfortable clothing and clothing appropriate for easy access to any Portacath or PICC line.   We strive to give you quality time with your provider. You may need to reschedule your appointment if you arrive late (15 or more minutes).  Arriving late affects you and other patients whose appointments are after yours.  Also, if you miss three or more appointments without notifying the office, you may be dismissed from the clinic at the providers discretion.      For prescription refill requests, have your pharmacy contact our office and allow 72 hours for refills to be completed.    Today you received the following chemotherapy and/or immunotherapy agents FASLODEX and ZOMETA      To help prevent nausea and vomiting after your treatment, we encourage you to take your nausea medication as directed.  BELOW ARE SYMPTOMS THAT SHOULD BE REPORTED IMMEDIATELY: *FEVER GREATER THAN 100.4 F (38 C) OR HIGHER *CHILLS OR SWEATING *NAUSEA AND VOMITING THAT IS NOT CONTROLLED WITH YOUR NAUSEA MEDICATION *UNUSUAL SHORTNESS OF BREATH *UNUSUAL BRUISING OR BLEEDING *URINARY PROBLEMS (pain or burning when urinating, or frequent urination) *BOWEL PROBLEMS (unusual diarrhea, constipation, pain near the anus) TENDERNESS IN MOUTH AND THROAT WITH OR WITHOUT PRESENCE OF ULCERS (sore throat, sores in mouth, or a toothache) UNUSUAL RASH, SWELLING OR PAIN  UNUSUAL VAGINAL DISCHARGE OR ITCHING   Items with * indicate a potential emergency and should be followed up as soon as possible or go to the Emergency Department if any problems should occur.  Please show the CHEMOTHERAPY ALERT CARD or IMMUNOTHERAPY ALERT CARD at  check-in to the Emergency Department and triage nurse.  Should you have questions after your visit or need to cancel or reschedule your appointment, please contact [facility] [place] AT [place]  [phone] and follow the prompts.  Office hours are 8:00 a.m. to 4:30 p.m. Monday - Friday. Please note that voicemails left after 4:00 p.m. may not be returned until the following business day.  We are closed weekends and major holidays. You have access to a nurse at all times for urgent questions. Please call the main number to the clinic [phone] and follow the prompts.  For any non-urgent questions, you may also contact your provider using MyChart. We now offer e-Visits for anyone 29 and older to request care online for non-urgent symptoms. For details visit [email or web address].   Also download the MyChart app! Go to the app store, search "MyChart", open the app, select [place], and log in with your MyChart username and password.  Due to Covid, a mask is required upon entering the hospital/clinic. If you do not have a mask, one will be given to you upon arrival. For doctor visits, patients may have 1 support person aged 20 or older with them. For treatment visits, patients cannot have anyone with them due to current Covid guidelines and our immunocompromised population.   Fulvestrant injection What is this medication? FULVESTRANT (ful VES trant) blocks the effects of estrogen. It is used to treat breast cancer. This medicine may be used for other purposes; ask your health care provider or pharmacist if you have questions. COMMON BRAND NAME(S): FASLODEX What should I tell my care team before  I take this medication? They need to know if you have any of these conditions: bleeding disorders liver disease low blood counts, like low white cell, platelet, or red cell counts an unusual or allergic reaction to fulvestrant, other medicines, foods, dyes, or preservatives pregnant  or trying to get pregnant breast-feeding How should I use this medication? This medicine is for injection into a muscle. It is usually given by a health care professional in a hospital or clinic setting. Talk to your pediatrician regarding the use of this medicine in children. Special care may be needed. Overdosage: If you think you have taken too much of this medicine contact a poison control center or emergency room at once. NOTE: This medicine is only for you. Do not share this medicine with others. What if I miss a dose? It is important not to miss your dose. Call your doctor or health care professional if you are unable to keep an appointment. What may interact with this medication? medicines that treat or prevent blood clots like warfarin, enoxaparin, dalteparin, apixaban, dabigatran, and rivaroxaban This list may not describe all possible interactions. Give your health care provider a list of all the medicines, herbs, non-prescription drugs, or dietary supplements you use. Also tell them if you smoke, drink alcohol, or use illegal drugs. Some items may interact with your medicine. What should I watch for while using this medication? Your condition will be monitored carefully while you are receiving this medicine. You will need important blood work done while you are taking this medicine. Do not become pregnant while taking this medicine or for at least 1 year after stopping it. Women of child-bearing potential will need to have a negative pregnancy test before starting this medicine. Women should inform their doctor if they wish to become pregnant or think they might be pregnant. There is a potential for serious side effects to an unborn child. Men should inform their doctors if they wish to father a child. This medicine may lower sperm counts. Talk to your health care professional or pharmacist for more information. Do not breast-feed an infant while taking this medicine or for 1 year after the  last dose. What side effects may I notice from receiving this medication? Side effects that you should report to your doctor or health care professional as soon as possible: allergic reactions like skin rash, itching or hives, swelling of the face, lips, or tongue feeling faint or lightheaded, falls pain, tingling, numbness, or weakness in the legs signs and symptoms of infection like fever or chills; cough; flu-like symptoms; sore throat vaginal bleeding Side effects that usually do not require medical attention (report to your doctor or health care professional if they continue or are bothersome): aches, pains constipation diarrhea headache hot flashes nausea, vomiting pain at site where injected stomach pain This list may not describe all possible side effects. Call your doctor for medical advice about side effects. You may report side effects to FDA at 1-800-FDA-1088. Where should I keep my medication? This drug is given in a hospital or clinic and will not be stored at home. NOTE: This sheet is a summary. It may not cover all possible information. If you have questions about this medicine, talk to your doctor, pharmacist, or health care provider.  2022 Elsevier/Gold Standard (2018-03-13 00:00:00)  Zoledronic Acid Injection (Hypercalcemia, Oncology) What is this medication? ZOLEDRONIC ACID (ZOE le dron ik AS id) slows calcium loss from bones. It high calcium levels in the blood from some kinds  of cancer. It may be used in other people at risk for bone loss. This medicine may be used for other purposes; ask your health care provider or pharmacist if you have questions. COMMON BRAND NAME(S): Zometa What should I tell my care team before I take this medication? They need to know if you have any of these conditions: cancer dehydration dental disease kidney disease liver disease low levels of calcium in the blood lung or breathing disease (asthma) receiving steroids like  dexamethasone or prednisone an unusual or allergic reaction to zoledronic acid, other medicines, foods, dyes, or preservatives pregnant or trying to get pregnant breast-feeding How should I use this medication? This drug is injected into a vein. It is given by a health care provider in a hospital or clinic setting. Talk to your health care provider about the use of this drug in children. Special care may be needed. Overdosage: If you think you have taken too much of this medicine contact a poison control center or emergency room at once. NOTE: This medicine is only for you. Do not share this medicine with others. What if I miss a dose? Keep appointments for follow-up doses. It is important not to miss your dose. Call your health care provider if you are unable to keep an appointment. What may interact with this medication? certain antibiotics given by injection NSAIDs, medicines for pain and inflammation, like ibuprofen or naproxen some diuretics like bumetanide, furosemide teriparatide thalidomide This list may not describe all possible interactions. Give your health care provider a list of all the medicines, herbs, non-prescription drugs, or dietary supplements you use. Also tell them if you smoke, drink alcohol, or use illegal drugs. Some items may interact with your medicine. What should I watch for while using this medication? Visit your health care provider for regular checks on your progress. It may be some time before you see the benefit from this drug. Some people who take this drug have severe bone, joint, or muscle pain. This drug may also increase your risk for jaw problems or a broken thigh bone. Tell your health care provider right away if you have severe pain in your jaw, bones, joints, or muscles. Tell you health care provider if you have any pain that does not go away or that gets worse. Tell your dentist and dental surgeon that you are taking this drug. You should not have  major dental surgery while on this drug. See your dentist to have a dental exam and fix any dental problems before starting this drug. Take good care of your teeth while on this drug. Make sure you see your dentist for regular follow-up appointments. You should make sure you get enough calcium and vitamin D while you are taking this drug. Discuss the foods you eat and the vitamins you take with your health care provider. Check with your health care provider if you have severe diarrhea, nausea, and vomiting, or if you sweat a lot. The loss of too much body fluid may make it dangerous for you to take this drug. You may need blood work done while you are taking this drug. Do not become pregnant while taking this drug. Women should inform their health care provider if they wish to become pregnant or think they might be pregnant. There is potential for serious harm to an unborn child. Talk to your health care provider for more information. What side effects may I notice from receiving this medication? Side effects that you should report to  your doctor or health care provider as soon as possible: allergic reactions (skin rash, itching or hives; swelling of the face, lips, or tongue) bone pain infection (fever, chills, cough, sore throat, pain or trouble passing urine) jaw pain, especially after dental work joint pain kidney injury (trouble passing urine or change in the amount of urine) low blood pressure (dizziness; feeling faint or lightheaded, falls; unusually weak or tired) low calcium levels (fast heartbeat; muscle cramps or pain; pain, tingling, or numbness in the hands or feet; seizures) low magnesium levels (fast, irregular heartbeat; muscle cramp or pain; muscle weakness; tremors; seizures) low red blood cell counts (trouble breathing; feeling faint; lightheaded, falls; unusually weak or tired) muscle pain redness, blistering, peeling, or loosening of the skin, including inside the mouth severe  diarrhea swelling of the ankles, feet, hands trouble breathing Side effects that usually do not require medical attention (report to your doctor or health care provider if they continue or are bothersome): anxious constipation coughing depressed mood eye irritation, itching, or pain fever general ill feeling or flu-like symptoms nausea pain, redness, or irritation at site where injected trouble sleeping This list may not describe all possible side effects. Call your doctor for medical advice about side effects. You may report side effects to FDA at 1-800-FDA-1088. Where should I keep my medication? This drug is given in a hospital or clinic. It will not be stored at home. NOTE: This sheet is a summary. It may not cover all possible information. If you have questions about this medicine, talk to your doctor, pharmacist, or health care provider.  2022 Elsevier/Gold Standard (2021-08-17 00:00:00)

## 2021-12-15 NOTE — Progress Notes (Signed)
Hematology/Oncology Follow Up Note [phone] [phone]  Patient Care Team: Marguerita Merles, MD as PCP - General (Family Medicine) End, Harrell Gave, MD as PCP - Cardiology (Cardiology) Rico Junker, RN as Oncology Nurse Navigator Earlie Server, MD as Consulting Physician (Oncology) Bary Castilla, Forest Gleason, MD (General Surgery) Noreene Filbert, MD as Referring Physician (Radiation Oncology) Noreene Filbert, MD as Radiation Oncologist (Radiation Oncology) Noreene Filbert, MD as Radiation Oncologist (Radiation Oncology)   Name of the patient: Theresa Chaney  [id number]  Apr 18, 1981   Date of visit: 12/15/21 REASON FOR VISIT Follow up for  treatment of breast cancer  Oncology History Cancer TREATMENT Neoadjuvant ddAC +one dose of Taxol, due to lack of response, surgery was offered. Case was discussed on breast tumor board. 03/19/2018 S/p right mastectomy and right axillary dissection, immediate breast reconstruction with placement of expanders.  ypT3 ypN2, + lymphovascular invasion,  Grade 3, margin is negative, close. ER 90%, PR 0%, HER2 IHC negative.  Also had elective bilateral salpingo-oophorectomy..  # 06/11/2018 s/p 11 cycles Taxol adjuvantly. Tolerated well.  # She has obtained dental clearance for starting Zometa.  S/p Zometa on 6/3/ 2019 # s/p adjuvant right chest wall radiation, finished 10/10/2018 #06/03/2019 underwent elective left prophylactic mastectomy and sentinel lymph node biopsy of left axilla.  # 06/03/2019 underwent elective left prophylactic mastectomy and sentinel lymph node biopsy of left axilla. Pathology negative for malignancy. Status post Mediport removal on 06/03/2019. She also underwent right implant removal on 06/03/2019. She also underwent right implant removal on 06/03/2019. #Negative genetic testing  #  chemotherapy-induced neuropathy, bilateral fingertips and lower extremities.  Patient is on Lyrica and nortriptyline.  Follows up with  neurology. She follows up with lymphedema clinic.  #  07/07/2020, MRI thoracic spine without contrast showed lesions involving the T7 posterior elements most concerning for metastatic lesion.  No evidence of epidural tumor.  Minimal thoracic spondylosis without stenosis. MRI was reviewed by me and a PET scan was obtained for further evaluation. 07/20/2020, PET scan showed hypermetabolic metastasis involving the posterior element of T7, no additional evidence of metastasis in the neck, chest, abdomen or pelvis.  # 07/29/2020 T7 lesion biopsy showed metastatic carcinoma, compatible with breast origin.  Receptor status staining showed ER 71-80% positive, PR negative, HER-2 positive IHC 3+ # Patient finished spine radiation on 08/31/2020   positive for COVID-19 on 01/11/2021  # 03/15/2021, PET scan showed no focal hypermetabolic activity to suggest skeletal metastasis.  Mild hypermetabolic activity along the right T7-8 paraspinal musculature.  Max SUV 3.3.  Likely postprocedural-  #Lower extremity cramps when she eats banana or oral potassium supplementation.not able to tolerate po potassium.   # 09/28/2021, echocardiogram showed further decrease of LVEF to 45%  INTERVAL HISTORY 41 yo female with above oncology history reviewed by me presents for follow-up of management of metastatic breast cancer.  Currently she has been off trastuzumab due to decreased LVEF/CHF.  She recently had a 2D echo done which showed some improvement of LVEF, not totally normalized yet. Patient reports shortness of breath with walking a flight of stairs.   Review of Systems  Constitutional:  Negative for chills, fever, malaise/fatigue and weight loss.  HENT:  Negative for sore throat.   Eyes:  Negative for redness.  Respiratory:  Negative for cough, shortness of breath and wheezing.   Cardiovascular:  Negative for chest pain, palpitations and leg swelling.  Gastrointestinal:  Negative for abdominal pain, blood in stool,  heartburn, nausea and vomiting.  Genitourinary:  Negative for dysuria.  Musculoskeletal:  Positive for back pain and joint pain. Negative for myalgias.  Skin:  Negative for rash.  Neurological:  Positive for tingling. Negative for dizziness and tremors.  Endo/Heme/Allergies:  Does not bruise/bleed easily.  Psychiatric/Behavioral:  Negative for hallucinations.    No Known Allergies  Patient Active Problem List   Diagnosis Date Noted   Tachycardia, unspecified 04/15/2021   Bone metastasis ([place]) 03/25/2021   Encounter for monoclonal antibody treatment for malignancy 12/10/2020   Bone lesion 11/19/2020   Hypocalcemia 11/19/2020   Inflammatory arthritis 11/19/2020   Encounter for antineoplastic chemotherapy 10/29/2020   Metastatic breast cancer ([place]) 08/31/2020   Goals of care, counseling/discussion 08/11/2020   Neuropathy due to chemotherapeutic drug ([place]) 08/11/2020   HER2-positive carcinoma of breast ([place]) 08/11/2020   Rheumatoid arteritis ([place]) 10/13/2019   Bilateral hand swelling 10/03/2019   Fracture of neck of metacarpal bone 05/14/2019   Chronic fatigue 04/09/2019   Polyarthralgia 04/09/2019   Status post right breast reconstruction 02/26/2019   Status post right mastectomy 02/26/2019   Mastalgia 02/15/2019   Shortness of breath 08/23/2018   Nonischemic cardiomyopathy ([place]) 08/23/2018   Preprocedural cardiovascular examination 08/23/2018   Tachycardia 08/23/2018   Palpitations 08/23/2018   Estrogen receptor positive status (ER+) 04/04/2018   Acquired absence of right breast and nipple 04/03/2018   Breast cancer of upper-outer quadrant of right female breast ([place]) 03/19/2018   Family history of cancer    Malignant neoplasm of overlapping sites of right breast in female, estrogen receptor positive ([place]) 10/19/2017   Gastroesophageal reflux disease without esophagitis 02/24/2017   Generalized anxiety disorder 10/03/2014   Headache 10/03/2014     Past Medical History:   Diagnosis Date   Anemia    Arthritis    BRCA negative 11/26/2017   Breast cancer ([place]) 10/11/2017   Multifocal, ER positive, PR negative, HER-2 negative. ypT3 ypN2a 8.7 cm, 4/15 nodes   Cardiomyopathy ([place])    a. 10/2017 Echo: EF 60-65%, no rwma, Gr1 DD, nl RV size/fxn; b. 04/2018 Echo: EF 55-60%, no rwma, Nl RV size/fxn; c. 08/2018 Echo: EF 45%, diff HK, ? HK of antsept wall. Gr1 DD. Mild MR. Mild LAE/RAE. Mod dil RV.    Chronic bronchitis ([place]) 11/2017   COPD (chronic obstructive pulmonary disease) ([facility])    MILD PER CXR   Depression    Family history of cancer    GERD (gastroesophageal reflux disease)    Headache    MIGRAINES   Heart murmur    ASYMPTOMATIC   Personal history of chemotherapy    current for right breast ca     Past Surgical History:  Procedure Laterality Date   AXILLARY LYMPH NODE DISSECTION Right 03/19/2018   Procedure: AXILLARY LYMPH NODE DISSECTION;  Surgeon: Robert Bellow, MD;  Location: [facility];  Service: General;  Laterality: Right;   BREAST BIOPSY Right 10/11/2017   12:30 posterior coil clip invasive mammary carcinoma   BREAST BIOPSY Right 10/11/2017   11:30 middle depth ribbon clip DCIS   BREAST BIOPSY Right 10/11/2017   5:30 anterior depth x shape invasive ductal carcinoma   BREAST IMPLANT REMOVAL Right 06/03/2019   Procedure: REMOVAL OF RIGHT BREAST IMPLANTS;  Surgeon: Wallace Going, DO;  Location: [facility];  Service: Plastics;  Laterality: Right;   BREAST RECONSTRUCTION WITH PLACEMENT OF TISSUE EXPANDER AND FLEX HD (ACELLULAR HYDRATED DERMIS) Right 03/19/2018   Procedure: BREAST RECONSTRUCTION WITH PLACEMENT OF TISSUE EXPANDER AND FLEX HD (ACELLULAR HYDRATED DERMIS);  Surgeon: Marla Roe,  Loel Lofty, DO;  Location: [facility];  Service: Plastics;  Laterality: Right;   CARPAL TUNNEL RELEASE Bilateral 2020   CHOLECYSTECTOMY N/A 04/27/2020   Procedure: LAPAROSCOPIC CHOLECYSTECTOMY WITH INTRAOPERATIVE CHOLANGIOGRAM;  Surgeon: Robert Bellow, MD;   Location: [facility];  Service: General;  Laterality: N/A;   ESOPHAGOGASTRODUODENOSCOPY (EGD) WITH PROPOFOL N/A 04/17/2020   Procedure: ESOPHAGOGASTRODUODENOSCOPY (EGD) WITH PROPOFOL;  Surgeon: Robert Bellow, MD;  Location: [facility];  Service: Endoscopy;  Laterality: N/A;  with biopsy   LAPAROSCOPIC BILATERAL SALPINGO OOPHERECTOMY Bilateral 03/19/2018   Procedure: LAPAROSCOPIC BILATERAL SALPINGO OOPHORECTOMY;  Surgeon: Benjaman Kindler, MD;  Location: [facility];  Service: Gynecology;  Laterality: Bilateral;   MASTECTOMY Right 03/2018   MASTECTOMY W/ SENTINEL NODE BIOPSY Right 03/19/2018   Procedure: MASTECTOMY WITH SENTINEL LYMPH NODE BIOPSY;  Surgeon: Robert Bellow, MD;  Location: [facility];  Service: General;  Laterality: Right;   PORT-A-CATH REMOVAL Left 06/03/2019   Procedure: REMOVAL PORT-A-CATH;  Surgeon: Robert Bellow, MD;  Location: [facility];  Service: General;  Laterality: Left;   PORTACATH PLACEMENT Left 10/24/2017   Procedure: INSERTION PORT-A-CATH;  Surgeon: Robert Bellow, MD;  Location: [facility];  Service: General;  Laterality: Left;   PORTACATH PLACEMENT Right 09/28/2020   Procedure: INSERTION PORT-A-CATH;  Surgeon: Robert Bellow, MD;  Location: [facility];  Service: General;  Laterality: Right;   REMOVAL OF TISSUE EXPANDER AND PLACEMENT OF IMPLANT Right 07/20/2018   Procedure: REMOVAL OF RIGHT BREAST TISSUE EXPANDER AND PLACEMENT OF IMPLANT;  Surgeon: Wallace Going, DO;  Location: [facility];  Service: Plastics;  Laterality: Right;   SIMPLE MASTECTOMY WITH AXILLARY SENTINEL NODE BIOPSY Left 06/03/2019   Procedure: SIMPLE MASTECTOMY LEFT;  Surgeon: Robert Bellow, MD;  Location: [facility];  Service: General;  Laterality: Left;    Social History   Socioeconomic History   Marital status: Married    Spouse name: Not on file   Number of children: Not on file   Years of education: Not on file   Highest education level: Not on file   Occupational History   Occupation: pharmacy tech    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 [employer]   Tobacco Use   Smoking status: Every Day    Packs/day: 0.50    Years: 18.00    Pack years: 9.00    Types: Cigarettes    Start date: 06/21/2018   Smokeless tobacco: Never  Vaping Use   Vaping Use: Never used  Substance and Sexual Activity   Alcohol use: No   Drug use: No   Sexual activity: Yes    Birth control/protection: Injection, Other-see comments    Comment: has had hysterectomy  Other Topics Concern   Not on file  Social History Narrative   Lives at home with husband and daughter   Social Determinants of Health   Financial Resource Strain: Not on file  Food Insecurity: Not on file  Transportation Needs: Not on file  Physical Activity: Not on file  Stress: Not on file  Social Connections: Not on file  Intimate Partner Violence: Not on file     Family History  Problem Relation Age of Onset   Diabetes Father    Hypertension Father    Hyperlipidemia Father    Heart attack Father 64       "mild"   Melanoma Maternal Aunt        other aunts with BCC/SCC/Melanoma   Cervical cancer Maternal Aunt 64  daughter w/ cervical cancer as well   Lung cancer Maternal Aunt    Melanoma Maternal Uncle        other uncles with BCC/SCC/Melanoma   Breast cancer Paternal Aunt    Bladder Cancer Maternal Grandmother   Biological mother had Grave's disease.    Current Outpatient Medications:    acetaminophen (TYLENOL) 500 MG tablet, Take 500 mg by mouth every 6 (six) hours as needed., Disp: , Rfl:    albuterol (VENTOLIN HFA) 108 (90 Base) MCG/ACT inhaler, Inhale 2 puffs into the lungs every 6 (six) hours as needed for wheezing or shortness of breath. , Disp: , Rfl:    CALCIUM CARBONATE-VITAMIN D PO, Take 600-800 mg by mouth in the morning, at noon, and at bedtime., Disp: , Rfl:    cyanocobalamin (,VITAMIN B-12,) 1000 MCG/ML injection, Inject 1,000 mcg into the skin every 30  (thirty) days., Disp: , Rfl:    cyclobenzaprine (FLEXERIL) 5 MG tablet, Take 5 mg by mouth 3 (three) times daily as needed for muscle spasms., Disp: , Rfl:    diphenoxylate-atropine (LOMOTIL) 2.5-0.025 MG tablet, Take 1 tablet by mouth 4 (four) times daily as needed for diarrhea or loose stools., Disp: 60 tablet, Rfl: 0   escitalopram (LEXAPRO) 20 MG tablet, Take 20 mg by mouth daily., Disp: , Rfl:    esomeprazole (NEXIUM) 40 MG capsule, Take 40 mg by mouth daily before breakfast. , Disp: , Rfl:    Eszopiclone 3 MG TABS, Take 3 mg by mouth at bedtime as needed (sleep). , Disp: , Rfl:    folic acid (FOLVITE) 1 MG tablet, Take 1 mg by mouth daily., Disp: , Rfl:    hydroxychloroquine (PLAQUENIL) 200 MG tablet, Take 200 mg by mouth daily., Disp: , Rfl:    ibuprofen (ADVIL) 800 MG tablet, Take 800 mg by mouth every 8 (eight) hours as needed for moderate pain., Disp: , Rfl:    lidocaine-prilocaine (EMLA) cream, Apply to port and cover 1-2 hours prior to appointment, Disp: 30 g, Rfl: 3   loperamide (IMODIUM) 2 MG capsule, Take 2 tablets with onset of diarrhea, then take 1 tablet every 2 hours until diarrhea stops. Maximum 8 tablets in 24hours, Disp: 30 capsule, Rfl: 3   loratadine (CLARITIN) 10 MG tablet, Take 10 mg by mouth daily. , Disp: , Rfl:    LORazepam (ATIVAN) 1 MG tablet, Take 1 mg by mouth 3 (three) times daily., Disp: , Rfl:    losartan (COZAAR) 25 MG tablet, Take 0.5 tablets (12.5 mg total) by mouth daily., Disp: 45 tablet, Rfl: 3   Magnesium 500 MG TABS, Take 500 mg by mouth 2 (two) times daily., Disp: , Rfl:    methotrexate (RHEUMATREX) 2.5 MG tablet, Take 25 mg by mouth every Sunday. 10 tablets once a week, Disp: , Rfl:    metoprolol succinate (TOPROL XL) 25 MG 24 hr tablet, Take 0.5 tablets (12.5 mg total) by mouth daily., Disp: 45 tablet, Rfl: 3   mupirocin ointment (BACTROBAN) 2 %, Place 1 application into the nose 2 (two) times daily. Use in each nostril twice daily for five (5) days.,  Disp: 22 g, Rfl: 5   nortriptyline (PAMELOR) 10 MG capsule, Take 30 mg by mouth at bedtime. , Disp: , Rfl:    oxyCODONE (OXY IR/ROXICODONE) 5 MG immediate release tablet, Take 1 tablet (5 mg total) by mouth every 6 (six) hours as needed for moderate pain or severe pain., Disp: 90 tablet, Rfl: 0   phentermine (ADIPEX-P) 37.5  MG tablet, Take 37.5 mg by mouth daily before breakfast. , Disp: , Rfl:    pregabalin (LYRICA) 150 MG capsule, Take 150 mg by mouth 2 (two) times daily., Disp: , Rfl:    promethazine (PHENERGAN) 25 MG tablet, Take 1 tablet (25 mg total) by mouth every 8 (eight) hours as needed for nausea or vomiting., Disp: 90 tablet, Rfl: 0   pyridOXINE (VITAMIN B-6) 100 MG tablet, Take 100 mg by mouth daily., Disp: , Rfl:    topiramate (TOPAMAX) 50 MG tablet, Take 50 mg by mouth 2 (two) times daily. , Disp: , Rfl:    vitamin C (ASCORBIC ACID) 500 MG tablet, Take 500 mg by mouth daily., Disp: , Rfl:  No current facility-administered medications for this visit.  Facility-Administered Medications Ordered in Other Visits:    0.9 %  sodium chloride infusion, , Intravenous, Continuous, Earlie Server, MD, Stopped at 12/15/21 1123   fulvestrant (FASLODEX) injection 500 mg, 500 mg, Intramuscular, Q30 days, Earlie Server, MD, 500 mg at 12/15/21 1123   heparin lock flush 100 unit/mL, 500 Units, Intravenous, Once, Earlie Server, MD   sodium chloride flush (NS) 0.9 % injection 10 mL, 10 mL, Intravenous, PRN, Earlie Server, MD, 10 mL at 11/19/18 1249   sodium chloride flush (NS) 0.9 % injection 10 mL, 10 mL, Intravenous, Once, Earlie Server, MD   Physical exam:  Vitals:   12/15/21 0955  BP: 110/72  Pulse: (!) 106  Temp: (!) 96.6 F (35.9 C)  TempSrc: Tympanic  Weight: 182 lb (82.6 kg)  ECOG 1 Physical Exam Constitutional:      General: She is not in acute distress.    Appearance: She is not diaphoretic.  HENT:     Head: Normocephalic and atraumatic.     Nose: Nose normal.     Mouth/Throat:     Pharynx: No  oropharyngeal exudate.  Eyes:     General: No scleral icterus.    Pupils: Pupils are equal, round, and reactive to light.  Neck:     Vascular: No JVD.  Cardiovascular:     Rate and Rhythm: Normal rate and regular rhythm.     Heart sounds: Normal heart sounds. No murmur heard. Pulmonary:     Effort: Pulmonary effort is normal. No respiratory distress.     Breath sounds: Normal breath sounds. No wheezing or rales.  Chest:     Chest wall: No tenderness.  Abdominal:     General: Bowel sounds are normal. There is no distension.     Palpations: Abdomen is soft. There is no mass.     Tenderness: There is no abdominal tenderness. There is no rebound.  Musculoskeletal:        General: Normal range of motion.     Cervical back: Normal range of motion and neck supple.  Lymphadenopathy:     Cervical: No cervical adenopathy.  Skin:    General: Skin is warm and dry.     Findings: No erythema or rash.  Neurological:     Mental Status: She is alert and oriented to person, place, and time.     Cranial Nerves: No cranial nerve deficit.     Motor: No abnormal muscle tone.     Coordination: Coordination normal.  Psychiatric:        Mood and Affect: Affect normal.       Labs  CMP Latest Ref Rng & Units 12/15/2021  Glucose 70 - 99 mg/dL 124(H)  BUN 6 - 20 mg/dL 20  Creatinine 0.44 - 1.00 mg/dL 0.88  Sodium 135 - 145 mmol/L 136  Potassium 3.5 - 5.1 mmol/L 3.4(L)  Chloride 98 - 111 mmol/L 106  CO2 22 - 32 mmol/L 21(L)  Calcium 8.9 - 10.3 mg/dL 8.8(L)  Total Protein 6.5 - 8.1 g/dL 7.5  Total Bilirubin 0.3 - 1.2 mg/dL 0.3  Alkaline Phos 38 - 126 U/L 71  AST 15 - 41 U/L 30  ALT 0 - 44 U/L 21   CBC Latest Ref Rng & Units 12/15/2021  WBC 4.0 - 10.5 K/uL 5.8  Hemoglobin 12.0 - 15.0 g/dL 11.2(L)  Hematocrit 36.0 - 46.0 % 33.2(L)  Platelets 150 - 400 K/uL 305   RADIOGRAPHIC STUDIES: I have personally reviewed the radiological images as listed and agreed with the findings in the report. NM  PET Image Restag (PS) Skull Base To Thigh  Result Date: 10/14/2021 CLINICAL DATA:  Subsequent treatment strategy for breast cancer. EXAM: NUCLEAR MEDICINE PET SKULL BASE TO THIGH TECHNIQUE: 9.69 mCi F-18 FDG was injected intravenously. Full-ring PET imaging was performed from the skull base to thigh after the radiotracer. CT data was obtained and used for attenuation correction and anatomic localization. Fasting blood glucose: 90 mg/dl COMPARISON:  March 15, 2021 FINDINGS: Mediastinal blood pool activity: SUV max 1.67 Liver activity: SUV max NA NECK: No hypermetabolic lymph nodes in the neck. Incidental CT findings: none CHEST: No hypermetabolic mediastinal or hilar nodes. No suspicious pulmonary nodules on the CT scan. Incidental CT findings: Post bilateral mastectomy with RIGHT axillary dissection. RIGHT-sided Port-A-Cath terminates in the RIGHT atrium as before. No consolidation or effusion. Airways are patent. Noncontrast appearance of the heart and great vessels is unremarkable. ABDOMEN/PELVIS: No abnormal hypermetabolic activity within the liver, pancreas, adrenal glands, or spleen. No hypermetabolic lymph nodes in the abdomen or pelvis. Incidental CT findings: none SKELETON: No focal hypermetabolic activity to suggest skeletal metastasis. Incidental CT findings: none IMPRESSION: Post bilateral mastectomy and RIGHT axillary dissection without signs of recurrent or metastatic disease. Electronically Signed   By: Zetta Bills M.D.   On: 10/14/2021 17:04   DG Bone Density  Result Date: 11/08/2021 EXAM: DUAL X-RAY ABSORPTIOMETRY (DXA) FOR BONE MINERAL DENSITY IMPRESSION: Your patient Zayana Salvador completed a BMD test on 11/08/2021 using the [place] (software version: 14.10) manufactured by [employer]. The following summarizes the results of our evaluation. Technologist: SCE PATIENT BIOGRAPHICAL: Name: Aquanetta, Schwarz Patient ID: [id number] Birth Date: Jul 28, 1981 Height: 66.5 in.  Gender: Female Exam Date: 11/08/2021 Weight: 181.7 lbs. Indications: Breast CA, Caucasian, History of Chemo, History of Radiation, Hysterectomy, Oophorectomy Bilateral, Postmenopausal, Surgical Induced Menopause Fractures: Treatments: calcium w/ vit D, Claritin, Nexium, Zometa DENSITOMETRY RESULTS: Site      Region     Measured Date Measured Age WHO Classification Young Adult T-score BMD         %Change vs. Previous Significant Change (*) AP Spine L1-L4 11/08/2021 38.7 Normal -0.5 1.128 g/cm2 3.4% Yes AP Spine L1-L4 08/23/2018 35.5 Normal -0.8 1.091 g/cm2 - - DualFemur Neck Right 11/08/2021 38.7 Normal -0.6 0.954 g/cm2 6.0% Yes DualFemur Neck Right 08/23/2018 35.5 Normal -1.0 0.900 g/cm2 - - DualFemur Total Mean 11/08/2021 38.7 Normal 0.1 1.014 g/cm2 6.3% Yes DualFemur Total Mean 08/23/2018 35.5 Normal -0.4 0.954 g/cm2 - - ASSESSMENT: The BMD measured at Femur Neck Right is 0.954 g/cm2 with a T-score of -0.6. This patient is considered normal according to [facility]) criteria. The scan quality is good. Compared with prior study,  there has been a significant increase in the spine. Compared with prior study, there has been a significant increase in the total hip. World Pharmacologist [facility]) criteria for post-menopausal, Caucasian Women: Normal:                   T-score at or above -1 SD Osteopenia/low bone mass: T-score between -1 and -2.5 SD Osteoporosis:             T-score at or below -2.5 SD RECOMMENDATIONS: 1. All patients should optimize calcium and vitamin D intake. 2. Consider FDA-approved medical therapies in postmenopausal women and men aged 17 years and older, based on the following: a. A hip or vertebral(clinical or morphometric) fracture b. T-score < -2.5 at the femoral neck or spine after appropriate evaluation to exclude secondary causes c. Low bone mass (T-score between -1.0 and -2.5 at the femoral neck or spine) and a 10-year probability of a hip fracture > 3% or a 10-year  probability of a major osteoporosis-related fracture > 20% based on the [place]-adapted WHO algorithm 3. Clinician judgment and/or patient preferences may indicate treatment for people with 10-year fracture probabilities above or below these levels FOLLOW-UP: People with diagnosed cases of osteoporosis or at high risk for fracture should have regular bone mineral density tests. For patients eligible for Medicare, routine testing is allowed once every 2 years. The testing frequency can be increased to one year for patients who have rapidly progressing disease, those who are receiving or discontinuing medical therapy to restore bone mass, or have additional risk factors. I have reviewed this report, and agree with the above findings. Mark A. Thornton Papas, M.D. [facility] Electronically Signed   By: Lavonia Dana M.D.   On: 11/08/2021 15:22   ECHOCARDIOGRAM COMPLETE  Result Date: 09/28/2021    ECHOCARDIOGRAM REPORT   Patient Name:   DARNISE MONTAG Date of Exam: 09/28/2021 Medical Rec #:  [id number]      Height:       67.0 in Accession #:    [id number]     Weight:       184.2 lb Date of Birth:  Sep 19, 1981      BSA:          1.953 m Patient Age:    21 years       BP:           99/66 mmHg Patient Gender: F              HR:           81 bpm. Exam Location:  [facility] Procedure: 2D Echo, Color Doppler, Cardiac Doppler and Intracardiac            Opacification Agent Indications:     Z09 Chemo  History:         Patient has prior history of Echocardiogram examinations, most                  recent 08/31/2021. Malignant neoplasm of overlapping sites of                  right breast in female, estrogen receptor positive.  Sonographer:     Charmayne Sheer Referring Phys:  [id number] Nicol Herbig Diagnosing Phys: Nelva Bush MD  Sonographer Comments: Suboptimal apical window. Global longitudinal strain was attempted. IMPRESSIONS  1. Left ventricular ejection fraction, by estimation, is 45%. The left ventricle has mild to moderately  decreased function. The left ventricle demonstrates global hypokinesis. Left  ventricular diastolic parameters were normal. The average left ventricular global longitudinal strain is -15.0 %. The global longitudinal strain is abnormal.  2. Right ventricular systolic function is normal. The right ventricular size is normal. Tricuspid regurgitation signal is inadequate for assessing PA pressure.  3. The mitral valve is normal in structure. Trivial mitral valve regurgitation. No evidence of mitral stenosis.  4. The aortic valve is tricuspid. Aortic valve regurgitation is not visualized. No aortic stenosis is present. Comparison(s): A prior study was performed on 08/31/2021. The left ventricular function is slightly worse. FINDINGS  Left Ventricle: Left ventricular ejection fraction, by estimation, is 45%. The left ventricle has mild to moderately decreased function. The left ventricle demonstrates global hypokinesis. Definity contrast agent was given IV to delineate the left ventricular endocardial borders. The average left ventricular global longitudinal strain is -15.0 %. The global longitudinal strain is abnormal. The left ventricular internal cavity size was normal in size. There is no left ventricular hypertrophy. Left ventricular diastolic parameters were normal. Right Ventricle: The right ventricular size is normal. No increase in right ventricular wall thickness. Right ventricular systolic function is normal. Tricuspid regurgitation signal is inadequate for assessing PA pressure. Left Atrium: Left atrial size was normal in size. Right Atrium: Right atrial size was normal in size. Pericardium: There is no evidence of pericardial effusion. Mitral Valve: The mitral valve is normal in structure. Trivial mitral valve regurgitation. No evidence of mitral valve stenosis. MV peak gradient, 3.3 mmHg. The mean mitral valve gradient is 2.0 mmHg. Tricuspid Valve: The tricuspid valve is normal in structure. Tricuspid valve  regurgitation is trivial. Aortic Valve: The aortic valve is tricuspid. Aortic valve regurgitation is not visualized. No aortic stenosis is present. Aortic valve mean gradient measures 3.0 mmHg. Aortic valve peak gradient measures 5.5 mmHg. Aortic valve area, by VTI measures 2.48 cm. Pulmonic Valve: The pulmonic valve was not well visualized. Pulmonic valve regurgitation is not visualized. No evidence of pulmonic stenosis. Aorta: The aortic root is normal in size and structure. Pulmonary Artery: The pulmonary artery is of normal size. IAS/Shunts: The interatrial septum was not well visualized.  LEFT VENTRICLE PLAX 2D LVIDd:         5.00 cm      Diastology LVIDs:         3.88 cm      LV e' medial:    11.00 cm/s LV PW:         0.78 cm      LV E/e' medial:  7.5 LV IVS:        0.66 cm      LV e' lateral:   16.50 cm/s LVOT diam:     2.00 cm      LV E/e' lateral: 5.0 LV SV:         51 LV SV Index:   26           2D Longitudinal Strain LVOT Area:     3.14 cm     2D Strain GLS Avg:     -15.0 %  LV Volumes (MOD) LV vol d, MOD A2C: 107.0 ml LV vol d, MOD A4C: 85.4 ml LV vol s, MOD A2C: 81.2 ml LV vol s, MOD A4C: 61.7 ml LV SV MOD A2C:     25.8 ml LV SV MOD A4C:     85.4 ml LV SV MOD BP:      24.5 ml RIGHT VENTRICLE RV Basal diam:  3.58 cm RV S prime:  12.10 cm/s LEFT ATRIUM             Index        RIGHT ATRIUM           Index LA diam:        3.50 cm 1.79 cm/m   RA Area:     11.20 cm LA Vol (A2C):   28.3 ml 14.49 ml/m  RA Volume:   25.30 ml  12.96 ml/m LA Vol (A4C):   33.0 ml 16.90 ml/m LA Biplane Vol: 32.7 ml 16.75 ml/m  AORTIC VALVE                    PULMONIC VALVE AV Area (Vmax):    2.35 cm     PV Vmax:       0.90 m/s AV Area (Vmean):   2.60 cm     PV Vmean:      59.000 cm/s AV Area (VTI):     2.48 cm     PV VTI:        0.159 m AV Vmax:           117.00 cm/s  PV Peak grad:  3.2 mmHg AV Vmean:          75.700 cm/s  PV Mean grad:  2.0 mmHg AV VTI:            0.204 m AV Peak Grad:      5.5 mmHg AV Mean Grad:       3.0 mmHg LVOT Vmax:         87.50 cm/s LVOT Vmean:        62.600 cm/s LVOT VTI:          0.161 m LVOT/AV VTI ratio: 0.79  AORTA Ao Root diam: 2.70 cm MITRAL VALVE MV Area (PHT): 5.50 cm    SHUNTS MV Area VTI:   3.10 cm    Systemic VTI:  0.16 m MV Peak grad:  3.3 mmHg    Systemic Diam: 2.00 cm MV Mean grad:  2.0 mmHg MV Vmax:       0.90 m/s MV Vmean:      62.6 cm/s MV Decel Time: 138 msec MV E velocity: 82.30 cm/s MV A velocity: 58.70 cm/s MV E/A ratio:  1.40 Nelva Bush MD Electronically signed by Nelva Bush MD Signature Date/Time: 09/28/2021/5:14:34 PM    Final    ECHOCARDIOGRAM LIMITED  Result Date: 11/11/2021    ECHOCARDIOGRAM LIMITED REPORT   Patient Name:   BETHZAIDA BOORD Date of Exam: 11/11/2021 Medical Rec #:  [id number]      Height:       67.0 in Accession #:    [id number]     Weight:       183.0 lb Date of Birth:  Aug 15, 1981      BSA:          1.947 m Patient Age:    40 years       BP:           99/66 mmHg Patient Gender: F              HR:           100 bpm. Exam Location:  [facility] Procedure: Limited Echo, Intracardiac Opacification Agent, Color Doppler and            Strain Analysis Indications:    I42.80 Non-ischemic cardiomyopathy  History:        Patient has prior history of  Echocardiogram examinations, most                 recent 09/28/2021. Cardiomyopathy, COPD, Arrythmias:Tachycardia,                 Signs/Symptoms:Shortness of Breath; Risk Factors:Current Smoker.  Sonographer:    Pilar Jarvis RDMS, RVT, RDCS Sonographer#2:  Wilkie Aye RVT Referring Phys: [id number]  1. Left ventricular ejection fraction, by estimation, is 45 to 50%. Left ventricular ejection fraction by 2D MOD biplane is 46.8 %. The left ventricle has mildly decreased function.  2. Right ventricular systolic function is normal. The right ventricular size is normal.  3. The mitral valve is normal in structure. No evidence of mitral valve regurgitation. FINDINGS  Left Ventricle: Left ventricular  ejection fraction, by estimation, is 45 to 50%. Left ventricular ejection fraction by 2D MOD biplane is 46.8 %. The left ventricle has mildly decreased function. Definity contrast agent was given IV to delineate the left  ventricular endocardial borders. 3D left ventricular ejection fraction analysis performed but not reported based on interpreter judgement due to suboptimal tracking. The left ventricular internal cavity size was normal in size. There is no left ventricular hypertrophy. Right Ventricle: The right ventricular size is normal. No increase in right ventricular wall thickness. Right ventricular systolic function is normal. Mitral Valve: The mitral valve is normal in structure. LEFT VENTRICLE PLAX 2D                        Biplane EF (MOD) LVIDd:         4.60 cm         LV Biplane EF:   Left LVIDs:         2.10 cm                          ventricular LV PW:         0.90 cm                          ejection LV IVS:        0.60 cm                          fraction by                                                 2D MOD                                                 biplane is LV Volumes (MOD)                                46.8 %. LV vol d, MOD    143.0 ml A2C: LV vol d, MOD    126.0 ml A4C: LV vol s, MOD    72.4 ml A2C:                           3D Volume  EF: LV vol s, MOD    70.0 ml       3D EF:        40 % A4C:                           LV EDV:       121 ml LV SV MOD A2C:   70.6 ml       LV ESV:       73 ml LV SV MOD A4C:   126.0 ml      LV SV:        49 ml LV SV MOD BP:    63.3 ml RIGHT VENTRICLE RV Basal diam:  4.10 cm RV S prime:     11.60 cm/s TAPSE (M-mode): 2.0 cm Kate Sable MD Electronically signed by Kate Sable MD Signature Date/Time: 11/11/2021/5:17:43 PM    Final          Assessment and plan-   Cancer Staging  Breast cancer of upper-outer quadrant of right female breast [facility]) Staging form: Breast, AJCC 8th Edition - Clinical: G2, ER+, PR+, HER2- - Signed by Earlie Server,  MD on 04/05/2018 Histologic grading system: 3 grade system - Pathologic stage from 04/04/2018: No Stage Recommended (ypT3, pN2, cM0, G3, ER+, PR-, HER2-) - Signed by Earlie Server, MD on 04/04/2018 Stage prefix: Post-therapy Neoadjuvant therapy: Yes Response to neoadjuvant therapy: No response Method of lymph node assessment: Axillary lymph node dissection Multigene prognostic tests performed: None Histologic grading system: 3 grade system Laterality: Right - Pathologic: Stage IV (rpTX, pNX, cM1, ER+, PR-, HER2+) - Signed by Earlie Server, MD on 12/10/2020 Stage prefix: Recurrence    1. Metastatic breast cancer ([place])   2. Bone metastasis ([place])   3. HER2-positive carcinoma of breast ([place])   4. Hypokalemia   5. Aromatase inhibitor use    Recurrent breast cancer with isolated bone metastasis, stage IV #Initially stage IIIA right breast cancer, ER positive, HER-2 negative status post bilateral oophorectomy,Right mastectomy and right axillary lymph node dissection, status post implant, and implant removal.  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 Estrogen positive, HER-2 positive breast cancer.- s/p Palliative radiation to T7  Previously on maintenance trastuzumab and pertuzumab. Currently treatments on hold due to decreased heart function. 11/12/21 echocardiogram showed LVEF of 45-50%. I discussed with the patient that I recommend continue to hold treatment and allowed her heart function to further improve.  She is NED on recent PET scan.  Patient agrees with this plan.  Repeat PET scan in March 2023 for close monitoring of her disease status given that she is currently off treatment. Patient also has repeat echocardiogram scheduled in March 2023.  We will decide whether to resume on treatment or not after both PET and echocardiogram are done. Continue fulvestrant monthly.  # Hypokalemia, does not tolerate oral  potassium.  Potassium is stable. # Bone metastasis, status post radiation. Continue Zometa monthly.  #Back pain worsening.  Continue oxycodone as needed.  Pending PET scan for evaluation. Consider MRI lumbar spine if needed in the future.  #Chemotherapy-induced neuropathy, continue Lyrica and nortriptyline #Depression.  Continue Lexapro 20 mg daily. #Rheumatoid arthritis,   Follow up with  rheumatology.  Patient is on methotrexate, recently added plaquenil.  # thyroid nodules -  thyroid US showed left lobe nodules are small no need for follow-up.  Right lobe nodule 1.1 cm, repeat thyroid  ultrasound in Aug 2023.   Follow-up continue monthly fulvestrant/Zometa Early April.  Lab MD fulvestrant/Zometa.  PET in March 2023.   Earlie Server, MD, PhD  12/15/21

## 2021-12-15 NOTE — Patient Instructions (Signed)
[facility]CER CTR AT [place]  Discharge Instructions: Thank you for choosing [facility] to provide your oncology and hematology care.  If you have a lab appointment with the [facility], please go directly to the [facility] and check in at the registration area.  Wear comfortable clothing and clothing appropriate for easy access to any Portacath or PICC line.   We strive to give you quality time with your provider. You may need to reschedule your appointment if you arrive late (15 or more minutes).  Arriving late affects you and other patients whose appointments are after yours.  Also, if you miss three or more appointments without notifying the office, you may be dismissed from the clinic at the providers discretion.      For prescription refill requests, have your pharmacy contact our office and allow 72 hours for refills to be completed.    Today you received the following chemotherapy and/or immunotherapy agents FASLODEX and ZOMETA      To help prevent nausea and vomiting after your treatment, we encourage you to take your nausea medication as directed.  BELOW ARE SYMPTOMS THAT SHOULD BE REPORTED IMMEDIATELY: *FEVER GREATER THAN 100.4 F (38 C) OR HIGHER *CHILLS OR SWEATING *NAUSEA AND VOMITING THAT IS NOT CONTROLLED WITH YOUR NAUSEA MEDICATION *UNUSUAL SHORTNESS OF BREATH *UNUSUAL BRUISING OR BLEEDING *URINARY PROBLEMS (pain or burning when urinating, or frequent urination) *BOWEL PROBLEMS (unusual diarrhea, constipation, pain near the anus) TENDERNESS IN MOUTH AND THROAT WITH OR WITHOUT PRESENCE OF ULCERS (sore throat, sores in mouth, or a toothache) UNUSUAL RASH, SWELLING OR PAIN  UNUSUAL VAGINAL DISCHARGE OR ITCHING   Items with * indicate a potential emergency and should be followed up as soon as possible or go to the Emergency Department if any problems should occur.  Please show the CHEMOTHERAPY ALERT CARD or IMMUNOTHERAPY ALERT CARD at  check-in to the Emergency Department and triage nurse.  Should you have questions after your visit or need to cancel or reschedule your appointment, please contact [facility] [place] AT [place]  [phone] and follow the prompts.  Office hours are 8:00 a.m. to 4:30 p.m. Monday - Friday. Please note that voicemails left after 4:00 p.m. may not be returned until the following business day.  We are closed weekends and major holidays. You have access to a nurse at all times for urgent questions. Please call the main number to the clinic [phone] and follow the prompts.  For any non-urgent questions, you may also contact your provider using MyChart. We now offer e-Visits for anyone 47 and older to request care online for non-urgent symptoms. For details visit [email or web address].   Also download the MyChart app! Go to the app store, search "MyChart", open the app, select [facility], and log in with your MyChart username and password.  Due to Covid, a mask is required upon entering the hospital/clinic. If you do not have a mask, one will be given to you upon arrival. For doctor visits, patients may have 1 support person aged 34 or older with them. For treatment visits, patients cannot have anyone with them due to current Covid guidelines and our immunocompromised population.   Zoledronic Acid Injection (Hypercalcemia, Oncology) What is this medication? ZOLEDRONIC ACID (ZOE le dron ik AS id) slows calcium loss from bones. It high calcium levels in the blood from some kinds of cancer. It may be used in other people at risk for bone loss. This medicine may be used for other  purposes; ask your health care provider or pharmacist if you have questions. COMMON BRAND NAME(S): Zometa What should I tell my care team before I take this medication? They need to know if you have any of these conditions: cancer dehydration dental disease kidney disease liver disease low levels of calcium  in the blood lung or breathing disease (asthma) receiving steroids like dexamethasone or prednisone an unusual or allergic reaction to zoledronic acid, other medicines, foods, dyes, or preservatives pregnant or trying to get pregnant breast-feeding How should I use this medication? This drug is injected into a vein. It is given by a health care provider in a hospital or clinic setting. Talk to your health care provider about the use of this drug in children. Special care may be needed. Overdosage: If you think you have taken too much of this medicine contact a poison control center or emergency room at once. NOTE: This medicine is only for you. Do not share this medicine with others. What if I miss a dose? Keep appointments for follow-up doses. It is important not to miss your dose. Call your health care provider if you are unable to keep an appointment. What may interact with this medication? certain antibiotics given by injection NSAIDs, medicines for pain and inflammation, like ibuprofen or naproxen some diuretics like bumetanide, furosemide teriparatide thalidomide This list may not describe all possible interactions. Give your health care provider a list of all the medicines, herbs, non-prescription drugs, or dietary supplements you use. Also tell them if you smoke, drink alcohol, or use illegal drugs. Some items may interact with your medicine. What should I watch for while using this medication? Visit your health care provider for regular checks on your progress. It may be some time before you see the benefit from this drug. Some people who take this drug have severe bone, joint, or muscle pain. This drug may also increase your risk for jaw problems or a broken thigh bone. Tell your health care provider right away if you have severe pain in your jaw, bones, joints, or muscles. Tell you health care provider if you have any pain that does not go away or that gets worse. Tell your dentist  and dental surgeon that you are taking this drug. You should not have major dental surgery while on this drug. See your dentist to have a dental exam and fix any dental problems before starting this drug. Take good care of your teeth while on this drug. Make sure you see your dentist for regular follow-up appointments. You should make sure you get enough calcium and vitamin D while you are taking this drug. Discuss the foods you eat and the vitamins you take with your health care provider. Check with your health care provider if you have severe diarrhea, nausea, and vomiting, or if you sweat a lot. The loss of too much body fluid may make it dangerous for you to take this drug. You may need blood work done while you are taking this drug. Do not become pregnant while taking this drug. Women should inform their health care provider if they wish to become pregnant or think they might be pregnant. There is potential for serious harm to an unborn child. Talk to your health care provider for more information. What side effects may I notice from receiving this medication? Side effects that you should report to your doctor or health care provider as soon as possible: allergic reactions (skin rash, itching or hives; swelling of the face,  lips, or tongue) bone pain infection (fever, chills, cough, sore throat, pain or trouble passing urine) jaw pain, especially after dental work joint pain kidney injury (trouble passing urine or change in the amount of urine) low blood pressure (dizziness; feeling faint or lightheaded, falls; unusually weak or tired) low calcium levels (fast heartbeat; muscle cramps or pain; pain, tingling, or numbness in the hands or feet; seizures) low magnesium levels (fast, irregular heartbeat; muscle cramp or pain; muscle weakness; tremors; seizures) low red blood cell counts (trouble breathing; feeling faint; lightheaded, falls; unusually weak or tired) muscle pain redness, blistering,  peeling, or loosening of the skin, including inside the mouth severe diarrhea swelling of the ankles, feet, hands trouble breathing Side effects that usually do not require medical attention (report to your doctor or health care provider if they continue or are bothersome): anxious constipation coughing depressed mood eye irritation, itching, or pain fever general ill feeling or flu-like symptoms nausea pain, redness, or irritation at site where injected trouble sleeping This list may not describe all possible side effects. Call your doctor for medical advice about side effects. You may report side effects to FDA at 1-800-FDA-1088. Where should I keep my medication? This drug is given in a hospital or clinic. It will not be stored at home. NOTE: This sheet is a summary. It may not cover all possible information. If you have questions about this medicine, talk to your doctor, pharmacist, or health care provider.  2022 Elsevier/Gold Standard (2021-08-17 00:00:00)  Fulvestrant injection What is this medication? FULVESTRANT (ful VES trant) blocks the effects of estrogen. It is used to treat breast cancer. This medicine may be used for other purposes; ask your health care provider or pharmacist if you have questions. COMMON BRAND NAME(S): FASLODEX What should I tell my care team before I take this medication? They need to know if you have any of these conditions: bleeding disorders liver disease low blood counts, like low white cell, platelet, or red cell counts an unusual or allergic reaction to fulvestrant, other medicines, foods, dyes, or preservatives pregnant or trying to get pregnant breast-feeding How should I use this medication? This medicine is for injection into a muscle. It is usually given by a health care professional in a hospital or clinic setting. Talk to your pediatrician regarding the use of this medicine in children. Special care may be needed. Overdosage: If you  think you have taken too much of this medicine contact a poison control center or emergency room at once. NOTE: This medicine is only for you. Do not share this medicine with others. What if I miss a dose? It is important not to miss your dose. Call your doctor or health care professional if you are unable to keep an appointment. What may interact with this medication? medicines that treat or prevent blood clots like warfarin, enoxaparin, dalteparin, apixaban, dabigatran, and rivaroxaban This list may not describe all possible interactions. Give your health care provider a list of all the medicines, herbs, non-prescription drugs, or dietary supplements you use. Also tell them if you smoke, drink alcohol, or use illegal drugs. Some items may interact with your medicine. What should I watch for while using this medication? Your condition will be monitored carefully while you are receiving this medicine. You will need important blood work done while you are taking this medicine. Do not become pregnant while taking this medicine or for at least 1 year after stopping it. Women of child-bearing potential will need to have  a negative pregnancy test before starting this medicine. Women should inform their doctor if they wish to become pregnant or think they might be pregnant. There is a potential for serious side effects to an unborn child. Men should inform their doctors if they wish to father a child. This medicine may lower sperm counts. Talk to your health care professional or pharmacist for more information. Do not breast-feed an infant while taking this medicine or for 1 year after the last dose. What side effects may I notice from receiving this medication? Side effects that you should report to your doctor or health care professional as soon as possible: allergic reactions like skin rash, itching or hives, swelling of the face, lips, or tongue feeling faint or lightheaded, falls pain, tingling,  numbness, or weakness in the legs signs and symptoms of infection like fever or chills; cough; flu-like symptoms; sore throat vaginal bleeding Side effects that usually do not require medical attention (report to your doctor or health care professional if they continue or are bothersome): aches, pains constipation diarrhea headache hot flashes nausea, vomiting pain at site where injected stomach pain This list may not describe all possible side effects. Call your doctor for medical advice about side effects. You may report side effects to FDA at 1-800-FDA-1088. Where should I keep my medication? This drug is given in a hospital or clinic and will not be stored at home. NOTE: This sheet is a summary. It may not cover all possible information. If you have questions about this medicine, talk to your doctor, pharmacist, or health care provider.  2022 Elsevier/Gold Standard (2018-03-13 00:00:00)

## 2022-01-11 ENCOUNTER — Other Ambulatory Visit: Payer: Self-pay | Admitting: Oncology

## 2022-01-11 MED ORDER — OXYCODONE HCL 5 MG PO TABS: 5.0000 mg | ORAL_TABLET | Freq: Four times a day (QID) | ORAL | 0 refills | Status: DC | PRN

## 2022-01-17 ENCOUNTER — Other Ambulatory Visit: Payer: Self-pay

## 2022-01-17 ENCOUNTER — Inpatient Hospital Stay: Payer: Medicare HMO

## 2022-01-17 ENCOUNTER — Inpatient Hospital Stay: Payer: Medicare HMO | Attending: Oncology

## 2022-01-17 VITALS — BP 107/59 | HR 99 | Temp 96.0°F | Resp 18

## 2022-01-17 DIAGNOSIS — C7951 Secondary malignant neoplasm of bone: Secondary | ICD-10-CM | POA: Diagnosis present

## 2022-01-17 DIAGNOSIS — Z5111 Encounter for antineoplastic chemotherapy: Secondary | ICD-10-CM | POA: Diagnosis not present

## 2022-01-17 DIAGNOSIS — C50919 Malignant neoplasm of unspecified site of unspecified female breast: Secondary | ICD-10-CM

## 2022-01-17 DIAGNOSIS — Z79899 Other long term (current) drug therapy: Secondary | ICD-10-CM | POA: Diagnosis not present

## 2022-01-17 DIAGNOSIS — C50411 Malignant neoplasm of upper-outer quadrant of right female breast: Secondary | ICD-10-CM | POA: Diagnosis present

## 2022-01-17 LAB — MAGNESIUM: Magnesium: 1.8 mg/dL (ref 1.7–2.4)

## 2022-01-17 LAB — COMPREHENSIVE METABOLIC PANEL
ALT: 17 U/L (ref 0–44)
AST: 28 U/L (ref 15–41)
Albumin: 4.3 g/dL (ref 3.5–5.0)
Alkaline Phosphatase: 61 U/L (ref 38–126)
Anion gap: 9 (ref 5–15)
BUN: 14 mg/dL (ref 6–20)
CO2: 24 mmol/L (ref 22–32)
Calcium: 8.9 mg/dL (ref 8.9–10.3)
Chloride: 103 mmol/L (ref 98–111)
Creatinine, Ser: 0.76 mg/dL (ref 0.44–1.00)
GFR, Estimated: >60 mL/min (ref >60)
Glucose, Bld: 100 mg/dL — ABNORMAL HIGH (ref 70–99)
Potassium: 3.5 mmol/L (ref 3.5–5.1)
Sodium: 136 mmol/L (ref 135–145)
Total Bilirubin: 0.3 mg/dL (ref 0.3–1.2)
Total Protein: 7.1 g/dL (ref 6.5–8.1)

## 2022-01-17 LAB — CBC WITH DIFFERENTIAL/PLATELET
Abs Immature Granulocytes: 0.03 10*3/uL (ref 0.00–0.07)
Basophils Absolute: 0 10*3/uL (ref 0.0–0.1)
Basophils Relative: 0 %
Eosinophils Absolute: 0.1 10*3/uL (ref 0.0–0.5)
Eosinophils Relative: 1 %
HCT: 32.8 % — ABNORMAL LOW (ref 36.0–46.0)
Hemoglobin: 10.8 g/dL — ABNORMAL LOW (ref 12.0–15.0)
Immature Granulocytes: 0 %
Lymphocytes Relative: 29 %
Lymphs Abs: 2.6 10*3/uL (ref 0.7–4.0)
MCH: 29.4 pg (ref 26.0–34.0)
MCHC: 32.9 g/dL (ref 30.0–36.0)
MCV: 89.4 fL (ref 80.0–100.0)
Monocytes Absolute: 0.8 10*3/uL (ref 0.1–1.0)
Monocytes Relative: 9 %
Neutro Abs: 5.4 10*3/uL (ref 1.7–7.7)
Neutrophils Relative %: 61 %
Platelets: 278 10*3/uL (ref 150–400)
RBC: 3.67 MIL/uL — ABNORMAL LOW (ref 3.87–5.11)
RDW: 13.9 % (ref 11.5–15.5)
WBC: 9 10*3/uL (ref 4.0–10.5)
nRBC: 0 % (ref 0.0–0.2)

## 2022-01-17 MED ORDER — SODIUM CHLORIDE 0.9 % IV SOLN
INTRAVENOUS | Status: DC
  Filled 2022-01-17: qty 250

## 2022-01-17 MED ORDER — FULVESTRANT 250 MG/5ML IM SOSY
500.0000 mg | PREFILLED_SYRINGE | INTRAMUSCULAR | Status: DC
  Administered 2022-01-17: 500 mg via INTRAMUSCULAR
  Filled 2022-01-17: qty 10

## 2022-01-17 MED ORDER — ZOLEDRONIC ACID 4 MG/100ML IV SOLN
4.0000 mg | Freq: Once | INTRAVENOUS | Status: AC
  Administered 2022-01-17: 4 mg via INTRAVENOUS
  Filled 2022-01-17: qty 100

## 2022-01-17 NOTE — Patient Instructions (Signed)
[facility] AT [place]  Discharge Instructions: Thank you for choosing [facility] to provide your oncology and hematology care.  If you have a lab appointment with the [facility], please go directly to the [facility] and check in at the registration area.  Wear comfortable clothing and clothing appropriate for easy access to any Portacath or PICC line.   We strive to give you quality time with your provider. You may need to reschedule your appointment if you arrive late (15 or more minutes).  Arriving late affects you and other patients whose appointments are after yours.  Also, if you miss three or more appointments without notifying the office, you may be dismissed from the clinic at the providers discretion.      For prescription refill requests, have your pharmacy contact our office and allow 72 hours for refills to be completed.    Today you received the following chemotherapy and/or immunotherapy agents FASLODEX and Zometa      To help prevent nausea and vomiting after your treatment, we encourage you to take your nausea medication as directed.  BELOW ARE SYMPTOMS THAT SHOULD BE REPORTED IMMEDIATELY: *FEVER GREATER THAN 100.4 F (38 C) OR HIGHER *CHILLS OR SWEATING *NAUSEA AND VOMITING THAT IS NOT CONTROLLED WITH YOUR NAUSEA MEDICATION *UNUSUAL SHORTNESS OF BREATH *UNUSUAL BRUISING OR BLEEDING *URINARY PROBLEMS (pain or burning when urinating, or frequent urination) *BOWEL PROBLEMS (unusual diarrhea, constipation, pain near the anus) TENDERNESS IN MOUTH AND THROAT WITH OR WITHOUT PRESENCE OF ULCERS (sore throat, sores in mouth, or a toothache) UNUSUAL RASH, SWELLING OR PAIN  UNUSUAL VAGINAL DISCHARGE OR ITCHING   Items with * indicate a potential emergency and should be followed up as soon as possible or go to the Emergency Department if any problems should occur.  Please show the CHEMOTHERAPY ALERT CARD or IMMUNOTHERAPY ALERT CARD at  check-in to the Emergency Department and triage nurse.  Should you have questions after your visit or need to cancel or reschedule your appointment, please contact [facility]CER [place] AT [place]  [phone] and follow the prompts.  Office hours are 8:00 a.m. to 4:30 p.m. Monday - Friday. Please note that voicemails left after 4:00 p.m. may not be returned until the following business day.  We are closed weekends and major holidays. You have access to a nurse at all times for urgent questions. Please call the main number to the clinic [phone] and follow the prompts.  For any non-urgent questions, you may also contact your provider using MyChart. We now offer e-Visits for anyone 48 and older to request care online for non-urgent symptoms. For details visit [email or web address].   Also download the MyChart app! Go to the app store, search "MyChart", open the app, select [facility], and log in with your MyChart username and password.  Due to Covid, a mask is required upon entering the hospital/clinic. If you do not have a mask, one will be given to you upon arrival. For doctor visits, patients may have 1 support person aged 63 or older with them. For treatment visits, patients cannot have anyone with them due to current Covid guidelines and our immunocompromised population.   Fulvestrant injection What is this medication? FULVESTRANT (ful VES trant) blocks the effects of estrogen. It is used to treat breast cancer. This medicine may be used for other purposes; ask your health care provider or pharmacist if you have questions. COMMON BRAND NAME(S): FASLODEX What should I tell my care team before  I take this medication? They need to know if you have any of these conditions: bleeding disorders liver disease low blood counts, like low white cell, platelet, or red cell counts an unusual or allergic reaction to fulvestrant, other medicines, foods, dyes, or preservatives pregnant  or trying to get pregnant breast-feeding How should I use this medication? This medicine is for injection into a muscle. It is usually given by a health care professional in a hospital or clinic setting. Talk to your pediatrician regarding the use of this medicine in children. Special care may be needed. Overdosage: If you think you have taken too much of this medicine contact a poison control center or emergency room at once. NOTE: This medicine is only for you. Do not share this medicine with others. What if I miss a dose? It is important not to miss your dose. Call your doctor or health care professional if you are unable to keep an appointment. What may interact with this medication? medicines that treat or prevent blood clots like warfarin, enoxaparin, dalteparin, apixaban, dabigatran, and rivaroxaban This list may not describe all possible interactions. Give your health care provider a list of all the medicines, herbs, non-prescription drugs, or dietary supplements you use. Also tell them if you smoke, drink alcohol, or use illegal drugs. Some items may interact with your medicine. What should I watch for while using this medication? Your condition will be monitored carefully while you are receiving this medicine. You will need important blood work done while you are taking this medicine. Do not become pregnant while taking this medicine or for at least 1 year after stopping it. Women of child-bearing potential will need to have a negative pregnancy test before starting this medicine. Women should inform their doctor if they wish to become pregnant or think they might be pregnant. There is a potential for serious side effects to an unborn child. Men should inform their doctors if they wish to father a child. This medicine may lower sperm counts. Talk to your health care professional or pharmacist for more information. Do not breast-feed an infant while taking this medicine or for 1 year after the  last dose. What side effects may I notice from receiving this medication? Side effects that you should report to your doctor or health care professional as soon as possible: allergic reactions like skin rash, itching or hives, swelling of the face, lips, or tongue feeling faint or lightheaded, falls pain, tingling, numbness, or weakness in the legs signs and symptoms of infection like fever or chills; cough; flu-like symptoms; sore throat vaginal bleeding Side effects that usually do not require medical attention (report to your doctor or health care professional if they continue or are bothersome): aches, pains constipation diarrhea headache hot flashes nausea, vomiting pain at site where injected stomach pain This list may not describe all possible side effects. Call your doctor for medical advice about side effects. You may report side effects to FDA at 1-800-FDA-1088. Where should I keep my medication? This drug is given in a hospital or clinic and will not be stored at home. NOTE: This sheet is a summary. It may not cover all possible information. If you have questions about this medicine, talk to your doctor, pharmacist, or health care provider.  2022 Elsevier/Gold Standard (2018-03-13 00:00:00)  Zoledronic Acid Injection (Hypercalcemia, Oncology) What is this medication? ZOLEDRONIC ACID (ZOE le dron ik AS id) slows calcium loss from bones. It high calcium levels in the blood from some kinds  of cancer. It may be used in other people at risk for bone loss. This medicine may be used for other purposes; ask your health care provider or pharmacist if you have questions. COMMON BRAND NAME(S): Zometa What should I tell my care team before I take this medication? They need to know if you have any of these conditions: cancer dehydration dental disease kidney disease liver disease low levels of calcium in the blood lung or breathing disease (asthma) receiving steroids like  dexamethasone or prednisone an unusual or allergic reaction to zoledronic acid, other medicines, foods, dyes, or preservatives pregnant or trying to get pregnant breast-feeding How should I use this medication? This drug is injected into a vein. It is given by a health care provider in a hospital or clinic setting. Talk to your health care provider about the use of this drug in children. Special care may be needed. Overdosage: If you think you have taken too much of this medicine contact a poison control center or emergency room at once. NOTE: This medicine is only for you. Do not share this medicine with others. What if I miss a dose? Keep appointments for follow-up doses. It is important not to miss your dose. Call your health care provider if you are unable to keep an appointment. What may interact with this medication? certain antibiotics given by injection NSAIDs, medicines for pain and inflammation, like ibuprofen or naproxen some diuretics like bumetanide, furosemide teriparatide thalidomide This list may not describe all possible interactions. Give your health care provider a list of all the medicines, herbs, non-prescription drugs, or dietary supplements you use. Also tell them if you smoke, drink alcohol, or use illegal drugs. Some items may interact with your medicine. What should I watch for while using this medication? Visit your health care provider for regular checks on your progress. It may be some time before you see the benefit from this drug. Some people who take this drug have severe bone, joint, or muscle pain. This drug may also increase your risk for jaw problems or a broken thigh bone. Tell your health care provider right away if you have severe pain in your jaw, bones, joints, or muscles. Tell you health care provider if you have any pain that does not go away or that gets worse. Tell your dentist and dental surgeon that you are taking this drug. You should not have  major dental surgery while on this drug. See your dentist to have a dental exam and fix any dental problems before starting this drug. Take good care of your teeth while on this drug. Make sure you see your dentist for regular follow-up appointments. You should make sure you get enough calcium and vitamin D while you are taking this drug. Discuss the foods you eat and the vitamins you take with your health care provider. Check with your health care provider if you have severe diarrhea, nausea, and vomiting, or if you sweat a lot. The loss of too much body fluid may make it dangerous for you to take this drug. You may need blood work done while you are taking this drug. Do not become pregnant while taking this drug. Women should inform their health care provider if they wish to become pregnant or think they might be pregnant. There is potential for serious harm to an unborn child. Talk to your health care provider for more information. What side effects may I notice from receiving this medication? Side effects that you should report to  your doctor or health care provider as soon as possible: allergic reactions (skin rash, itching or hives; swelling of the face, lips, or tongue) bone pain infection (fever, chills, cough, sore throat, pain or trouble passing urine) jaw pain, especially after dental work joint pain kidney injury (trouble passing urine or change in the amount of urine) low blood pressure (dizziness; feeling faint or lightheaded, falls; unusually weak or tired) low calcium levels (fast heartbeat; muscle cramps or pain; pain, tingling, or numbness in the hands or feet; seizures) low magnesium levels (fast, irregular heartbeat; muscle cramp or pain; muscle weakness; tremors; seizures) low red blood cell counts (trouble breathing; feeling faint; lightheaded, falls; unusually weak or tired) muscle pain redness, blistering, peeling, or loosening of the skin, including inside the mouth severe  diarrhea swelling of the ankles, feet, hands trouble breathing Side effects that usually do not require medical attention (report to your doctor or health care provider if they continue or are bothersome): anxious constipation coughing depressed mood eye irritation, itching, or pain fever general ill feeling or flu-like symptoms nausea pain, redness, or irritation at site where injected trouble sleeping This list may not describe all possible side effects. Call your doctor for medical advice about side effects. You may report side effects to FDA at 1-800-FDA-1088. Where should I keep my medication? This drug is given in a hospital or clinic. It will not be stored at home. NOTE: This sheet is a summary. It may not cover all possible information. If you have questions about this medicine, talk to your doctor, pharmacist, or health care provider.  2022 Elsevier/Gold Standard (2021-08-17 00:00:00)

## 2022-01-20 ENCOUNTER — Encounter: Payer: Self-pay | Admitting: Oncology

## 2022-01-20 NOTE — Telephone Encounter (Signed)
Please advise 

## 2022-02-09 ENCOUNTER — Encounter: Payer: Self-pay | Admitting: Oncology

## 2022-02-09 NOTE — Telephone Encounter (Signed)
Please advise 

## 2022-02-10 NOTE — Telephone Encounter (Signed)
Patient wants to know if you have any other recommendations besides PET scan being moved up?  ?

## 2022-02-11 ENCOUNTER — Telehealth: Payer: Self-pay | Admitting: Oncology

## 2022-02-11 ENCOUNTER — Telehealth: Payer: Self-pay

## 2022-02-11 DIAGNOSIS — C7951 Secondary malignant neoplasm of bone: Secondary | ICD-10-CM

## 2022-02-11 DIAGNOSIS — M545 Low back pain, unspecified: Secondary | ICD-10-CM

## 2022-02-11 DIAGNOSIS — C50919 Malignant neoplasm of unspecified site of unspecified female breast: Secondary | ICD-10-CM

## 2022-02-11 NOTE — Telephone Encounter (Signed)
Patient sent Mychart message reporting mid back pain. Attempt made to move up PET to next week, but no appt available until 3/20. Per Dr. Tasia Catchings, schedule STAT MRI lumbar/ thoracic. Message sent to scheduling to set up appt and inform pt of appts ?

## 2022-02-11 NOTE — Telephone Encounter (Signed)
Scheduled MRI's per 3/3 secure chat, pt has been called and confirmed appts ?

## 2022-02-11 NOTE — Telephone Encounter (Signed)
Patient scheduled for MRI on 3/10. Scheduling to notify pt of appts.  ?

## 2022-02-11 NOTE — Telephone Encounter (Signed)
Theresa Chaney, can you check to see if PET can be done at [facility] within a week? If not, will you let me know and we will schedule her and MRI.  ?

## 2022-02-14 ENCOUNTER — Inpatient Hospital Stay: Payer: Medicare HMO | Attending: Oncology

## 2022-02-14 ENCOUNTER — Inpatient Hospital Stay: Payer: Medicare HMO

## 2022-02-14 ENCOUNTER — Other Ambulatory Visit: Payer: Self-pay | Admitting: Oncology

## 2022-02-14 ENCOUNTER — Other Ambulatory Visit: Payer: Self-pay

## 2022-02-14 VITALS — BP 127/71 | HR 108 | Temp 96.1°F | Resp 20 | Wt 186.1 lb

## 2022-02-14 DIAGNOSIS — C50411 Malignant neoplasm of upper-outer quadrant of right female breast: Secondary | ICD-10-CM | POA: Diagnosis present

## 2022-02-14 DIAGNOSIS — Z79899 Other long term (current) drug therapy: Secondary | ICD-10-CM | POA: Insufficient documentation

## 2022-02-14 DIAGNOSIS — Z5111 Encounter for antineoplastic chemotherapy: Secondary | ICD-10-CM | POA: Insufficient documentation

## 2022-02-14 DIAGNOSIS — C7951 Secondary malignant neoplasm of bone: Secondary | ICD-10-CM | POA: Diagnosis present

## 2022-02-14 DIAGNOSIS — C50919 Malignant neoplasm of unspecified site of unspecified female breast: Secondary | ICD-10-CM

## 2022-02-14 LAB — CBC WITH DIFFERENTIAL/PLATELET
Abs Immature Granulocytes: 0.03 10*3/uL (ref 0.00–0.07)
Basophils Absolute: 0 10*3/uL (ref 0.0–0.1)
Basophils Relative: 0 %
Eosinophils Absolute: 0.1 10*3/uL (ref 0.0–0.5)
Eosinophils Relative: 1 %
HCT: 35.2 % — ABNORMAL LOW (ref 36.0–46.0)
Hemoglobin: 11.6 g/dL — ABNORMAL LOW (ref 12.0–15.0)
Immature Granulocytes: 0 %
Lymphocytes Relative: 30 %
Lymphs Abs: 2.1 10*3/uL (ref 0.7–4.0)
MCH: 30.1 pg (ref 26.0–34.0)
MCHC: 33 g/dL (ref 30.0–36.0)
MCV: 91.2 fL (ref 80.0–100.0)
Monocytes Absolute: 0.7 10*3/uL (ref 0.1–1.0)
Monocytes Relative: 10 %
Neutro Abs: 4.1 10*3/uL (ref 1.7–7.7)
Neutrophils Relative %: 59 %
Platelets: 278 10*3/uL (ref 150–400)
RBC: 3.86 MIL/uL — ABNORMAL LOW (ref 3.87–5.11)
RDW: 13.5 % (ref 11.5–15.5)
WBC: 7.1 10*3/uL (ref 4.0–10.5)
nRBC: 0 % (ref 0.0–0.2)

## 2022-02-14 LAB — COMPREHENSIVE METABOLIC PANEL
ALT: 19 U/L (ref 0–44)
AST: 27 U/L (ref 15–41)
Albumin: 4.1 g/dL (ref 3.5–5.0)
Alkaline Phosphatase: 60 U/L (ref 38–126)
Anion gap: 10 (ref 5–15)
BUN: 16 mg/dL (ref 6–20)
CO2: 22 mmol/L (ref 22–32)
Calcium: 8.7 mg/dL — ABNORMAL LOW (ref 8.9–10.3)
Chloride: 104 mmol/L (ref 98–111)
Creatinine, Ser: 0.83 mg/dL (ref 0.44–1.00)
GFR, Estimated: >60 mL/min (ref >60)
Glucose, Bld: 117 mg/dL — ABNORMAL HIGH (ref 70–99)
Potassium: 3.3 mmol/L — ABNORMAL LOW (ref 3.5–5.1)
Sodium: 136 mmol/L (ref 135–145)
Total Bilirubin: 0.1 mg/dL — ABNORMAL LOW (ref 0.3–1.2)
Total Protein: 7.3 g/dL (ref 6.5–8.1)

## 2022-02-14 LAB — MAGNESIUM: Magnesium: 2 mg/dL (ref 1.7–2.4)

## 2022-02-14 MED ORDER — FULVESTRANT 250 MG/5ML IM SOSY
500.0000 mg | PREFILLED_SYRINGE | INTRAMUSCULAR | Status: DC
  Administered 2022-02-14: 500 mg via INTRAMUSCULAR
  Filled 2022-02-14: qty 10

## 2022-02-14 MED ORDER — ZOLEDRONIC ACID 4 MG/100ML IV SOLN
4.0000 mg | Freq: Once | INTRAVENOUS | Status: AC
  Administered 2022-02-14: 4 mg via INTRAVENOUS
  Filled 2022-02-14: qty 100

## 2022-02-14 MED ORDER — HEPARIN SOD (PORK) LOCK FLUSH 100 UNIT/ML IV SOLN
500.0000 [IU] | Freq: Once | INTRAVENOUS | Status: AC
  Administered 2022-02-14: 500 [IU] via INTRAVENOUS
  Filled 2022-02-14: qty 5

## 2022-02-14 MED ORDER — SODIUM CHLORIDE 0.9 % IV SOLN
INTRAVENOUS | Status: DC
  Filled 2022-02-14: qty 250

## 2022-02-14 NOTE — Patient Instructions (Signed)
[facility] AT [place]  Discharge Instructions: Thank you for choosing [facility] to provide your oncology and hematology care.  If you have a lab appointment with the [facility], please go directly to the [facility] and check in at the registration area.  Wear comfortable clothing and clothing appropriate for easy access to any Portacath or PICC line.   We strive to give you quality time with your provider. You may need to reschedule your appointment if you arrive late (15 or more minutes).  Arriving late affects you and other patients whose appointments are after yours.  Also, if you miss three or more appointments without notifying the office, you may be dismissed from the clinic at the providers discretion.      For prescription refill requests, have your pharmacy contact our office and allow 72 hours for refills to be completed.    Today you received the following chemotherapy and/or immunotherapy agents: Faslodex, Zometa       To help prevent nausea and vomiting after your treatment, we encourage you to take your nausea medication as directed.  BELOW ARE SYMPTOMS THAT SHOULD BE REPORTED IMMEDIATELY: *FEVER GREATER THAN 100.4 F (38 C) OR HIGHER *CHILLS OR SWEATING *NAUSEA AND VOMITING THAT IS NOT CONTROLLED WITH YOUR NAUSEA MEDICATION *UNUSUAL SHORTNESS OF BREATH *UNUSUAL BRUISING OR BLEEDING *URINARY PROBLEMS (pain or burning when urinating, or frequent urination) *BOWEL PROBLEMS (unusual diarrhea, constipation, pain near the anus) TENDERNESS IN MOUTH AND THROAT WITH OR WITHOUT PRESENCE OF ULCERS (sore throat, sores in mouth, or a toothache) UNUSUAL RASH, SWELLING OR PAIN  UNUSUAL VAGINAL DISCHARGE OR ITCHING   Items with * indicate a potential emergency and should be followed up as soon as possible or go to the Emergency Department if any problems should occur.  Please show the CHEMOTHERAPY ALERT CARD or IMMUNOTHERAPY ALERT CARD at  check-in to the Emergency Department and triage nurse.  Should you have questions after your visit or need to cancel or reschedule your appointment, please contact [facility] [place] [facility] [place]  [phone] and follow the prompts.  Office hours are 8:00 a.m. to 4:30 p.m. Monday - Friday. Please note that voicemails left after 4:00 p.m. may not be returned until the following business day.  We are closed weekends and major holidays. You have access to a nurse at all times for urgent questions. Please call the main number to the clinic [phone] and follow the prompts.  For any non-urgent questions, you may also contact your provider using MyChart. We now offer e-Visits for anyone 67 and older to request care online for non-urgent symptoms. For details visit [email or web address].   Also download the MyChart app! Go to the app store, search "MyChart", open the app, select [facility], and log in with your MyChart username and password.  Due to Covid, a mask is required upon entering the hospital/clinic. If you do not have a mask, one will be given to you upon arrival. For doctor visits, patients may have 1 support person aged 98 or older with them. For treatment visits, patients cannot have anyone with them due to current Covid guidelines and our immunocompromised population. Zoledronic Acid Injection (Hypercalcemia, Oncology) What is this medication? ZOLEDRONIC ACID (ZOE le dron ik AS id) slows calcium loss from bones. It high calcium levels in the blood from some kinds of cancer. It may be used in other people at risk for bone loss. This medicine may be used for other purposes; ask  your health care provider or pharmacist if you have questions. COMMON BRAND NAME(S): Zometa What should I tell my care team before I take this medication? They need to know if you have any of these conditions: cancer dehydration dental disease kidney disease liver disease low levels of calcium in  the blood lung or breathing disease (asthma) receiving steroids like dexamethasone or prednisone an unusual or allergic reaction to zoledronic acid, other medicines, foods, dyes, or preservatives pregnant or trying to get pregnant breast-feeding How should I use this medication? This drug is injected into a vein. It is given by a health care provider in a hospital or clinic setting. Talk to your health care provider about the use of this drug in children. Special care may be needed. Overdosage: If you think you have taken too much of this medicine contact a poison control center or emergency room at once. NOTE: This medicine is only for you. Do not share this medicine with others. What if I miss a dose? Keep appointments for follow-up doses. It is important not to miss your dose. Call your health care provider if you are unable to keep an appointment. What may interact with this medication? certain antibiotics given by injection NSAIDs, medicines for pain and inflammation, like ibuprofen or naproxen some diuretics like bumetanide, furosemide teriparatide thalidomide This list may not describe all possible interactions. Give your health care provider a list of all the medicines, herbs, non-prescription drugs, or dietary supplements you use. Also tell them if you smoke, drink alcohol, or use illegal drugs. Some items may interact with your medicine. What should I watch for while using this medication? Visit your health care provider for regular checks on your progress. It may be some time before you see the benefit from this drug. Some people who take this drug have severe bone, joint, or muscle pain. This drug may also increase your risk for jaw problems or a broken thigh bone. Tell your health care provider right away if you have severe pain in your jaw, bones, joints, or muscles. Tell you health care provider if you have any pain that does not go away or that gets worse. Tell your dentist and  dental surgeon that you are taking this drug. You should not have major dental surgery while on this drug. See your dentist to have a dental exam and fix any dental problems before starting this drug. Take good care of your teeth while on this drug. Make sure you see your dentist for regular follow-up appointments. You should make sure you get enough calcium and vitamin D while you are taking this drug. Discuss the foods you eat and the vitamins you take with your health care provider. Check with your health care provider if you have severe diarrhea, nausea, and vomiting, or if you sweat a lot. The loss of too much body fluid may make it dangerous for you to take this drug. You may need blood work done while you are taking this drug. Do not become pregnant while taking this drug. Women should inform their health care provider if they wish to become pregnant or think they might be pregnant. There is potential for serious harm to an unborn child. Talk to your health care provider for more information. What side effects may I notice from receiving this medication? Side effects that you should report to your doctor or health care provider as soon as possible: allergic reactions (skin rash, itching or hives; swelling of the face, lips, or  tongue) bone pain infection (fever, chills, cough, sore throat, pain or trouble passing urine) jaw pain, especially after dental work joint pain kidney injury (trouble passing urine or change in the amount of urine) low blood pressure (dizziness; feeling faint or lightheaded, falls; unusually weak or tired) low calcium levels (fast heartbeat; muscle cramps or pain; pain, tingling, or numbness in the hands or feet; seizures) low magnesium levels (fast, irregular heartbeat; muscle cramp or pain; muscle weakness; tremors; seizures) low red blood cell counts (trouble breathing; feeling faint; lightheaded, falls; unusually weak or tired) muscle pain redness, blistering,  peeling, or loosening of the skin, including inside the mouth severe diarrhea swelling of the ankles, feet, hands trouble breathing Side effects that usually do not require medical attention (report to your doctor or health care provider if they continue or are bothersome): anxious constipation coughing depressed mood eye irritation, itching, or pain fever general ill feeling or flu-like symptoms nausea pain, redness, or irritation at site where injected trouble sleeping This list may not describe all possible side effects. Call your doctor for medical advice about side effects. You may report side effects to FDA at 1-800-FDA-1088. Where should I keep my medication? This drug is given in a hospital or clinic. It will not be stored at home. NOTE: This sheet is a summary. It may not cover all possible information. If you have questions about this medicine, talk to your doctor, pharmacist, or health care provider.  2022 Elsevier/Gold Standard (2021-08-17 00:00:00) Fulvestrant injection What is this medication? FULVESTRANT (ful VES trant) blocks the effects of estrogen. It is used to treat breast cancer. This medicine may be used for other purposes; ask your health care provider or pharmacist if you have questions. COMMON BRAND NAME(S): FASLODEX What should I tell my care team before I take this medication? They need to know if you have any of these conditions: bleeding disorders liver disease low blood counts, like low white cell, platelet, or red cell counts an unusual or allergic reaction to fulvestrant, other medicines, foods, dyes, or preservatives pregnant or trying to get pregnant breast-feeding How should I use this medication? This medicine is for injection into a muscle. It is usually given by a health care professional in a hospital or clinic setting. Talk to your pediatrician regarding the use of this medicine in children. Special care may be needed. Overdosage: If you  think you have taken too much of this medicine contact a poison control center or emergency room at once. NOTE: This medicine is only for you. Do not share this medicine with others. What if I miss a dose? It is important not to miss your dose. Call your doctor or health care professional if you are unable to keep an appointment. What may interact with this medication? medicines that treat or prevent blood clots like warfarin, enoxaparin, dalteparin, apixaban, dabigatran, and rivaroxaban This list may not describe all possible interactions. Give your health care provider a list of all the medicines, herbs, non-prescription drugs, or dietary supplements you use. Also tell them if you smoke, drink alcohol, or use illegal drugs. Some items may interact with your medicine. What should I watch for while using this medication? Your condition will be monitored carefully while you are receiving this medicine. You will need important blood work done while you are taking this medicine. Do not become pregnant while taking this medicine or for at least 1 year after stopping it. Women of child-bearing potential will need to have a negative pregnancy  test before starting this medicine. Women should inform their doctor if they wish to become pregnant or think they might be pregnant. There is a potential for serious side effects to an unborn child. Men should inform their doctors if they wish to father a child. This medicine may lower sperm counts. Talk to your health care professional or pharmacist for more information. Do not breast-feed an infant while taking this medicine or for 1 year after the last dose. What side effects may I notice from receiving this medication? Side effects that you should report to your doctor or health care professional as soon as possible: allergic reactions like skin rash, itching or hives, swelling of the face, lips, or tongue feeling faint or lightheaded, falls pain, tingling,  numbness, or weakness in the legs signs and symptoms of infection like fever or chills; cough; flu-like symptoms; sore throat vaginal bleeding Side effects that usually do not require medical attention (report to your doctor or health care professional if they continue or are bothersome): aches, pains constipation diarrhea headache hot flashes nausea, vomiting pain at site where injected stomach pain This list may not describe all possible side effects. Call your doctor for medical advice about side effects. You may report side effects to FDA at 1-800-FDA-1088. Where should I keep my medication? This drug is given in a hospital or clinic and will not be stored at home. NOTE: This sheet is a summary. It may not cover all possible information. If you have questions about this medicine, talk to your doctor, pharmacist, or health care provider.  2022 Elsevier/Gold Standard (2018-03-13 00:00:00)

## 2022-02-15 ENCOUNTER — Encounter: Payer: Self-pay | Admitting: Oncology

## 2022-02-15 MED ORDER — PROMETHAZINE HCL 25 MG PO TABS: 25.0000 mg | ORAL_TABLET | Freq: Three times a day (TID) | ORAL | 0 refills | Status: AC | PRN

## 2022-02-18 ENCOUNTER — Ambulatory Visit
Admission: RE | Admit: 2022-02-18 | Discharge: 2022-02-18 | Disposition: A | Discharge status: Home / Self Care | Payer: Medicare HMO | Source: Ambulatory Visit | Attending: Oncology | Admitting: Oncology

## 2022-02-18 ENCOUNTER — Other Ambulatory Visit: Payer: Self-pay

## 2022-02-18 DIAGNOSIS — C50919 Malignant neoplasm of unspecified site of unspecified female breast: Secondary | ICD-10-CM | POA: Diagnosis present

## 2022-02-18 DIAGNOSIS — C7951 Secondary malignant neoplasm of bone: Secondary | ICD-10-CM

## 2022-02-18 DIAGNOSIS — M545 Low back pain, unspecified: Secondary | ICD-10-CM | POA: Insufficient documentation

## 2022-02-18 IMAGING — MR MR THORACIC SPINE WO/W CM
6 of 9 series · 27 of 48 positions shown · IV contrast (gadavist)
Comparison: Prior MRI from [DATE].

CLINICAL DATA: Initial evaluation for mid lower back pain. History
of metastatic breast cancer.

EXAM:
MRI THORACIC AND LUMBAR SPINE WITHOUT AND WITH CONTRAST
TECHNIQUE: Multiplanar and multiecho pulse sequences of the thoracic and lumbar
spine were obtained without and with intravenous contrast.
CONTRAST:  7.5mL GADAVIST GADOBUTROL 1 MMOL/ML IV SOLN

[Series 16: T1 · sagittal · 5.0mm · 1.41mm/px · 3 of 9 slices shown (1 of 3)]
[im 1/9]
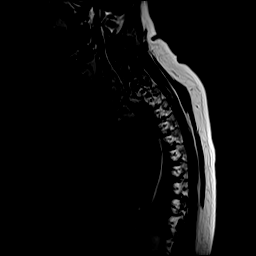
[im 5/9]
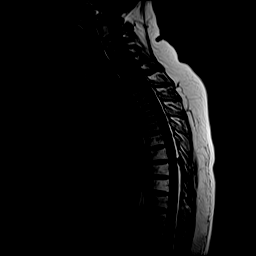
[im 9/9]
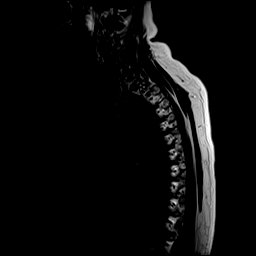

[Series 17: T2 · sagittal · 3.0mm · 1.06mm/px · 4 of 17 slices shown (1 of 2)]
[im 1/17]
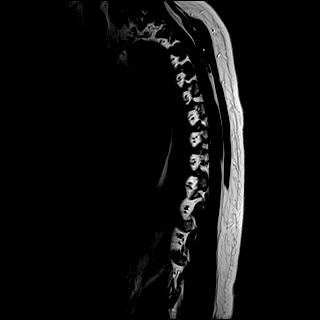
[im 6/17]
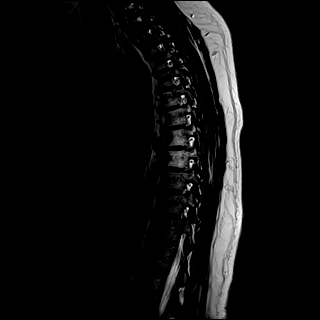
[im 11/17]
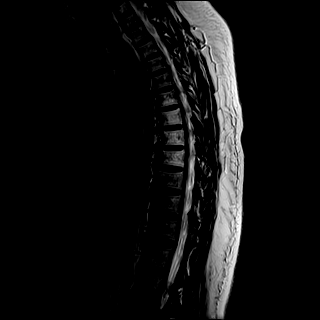
[im 17/17]
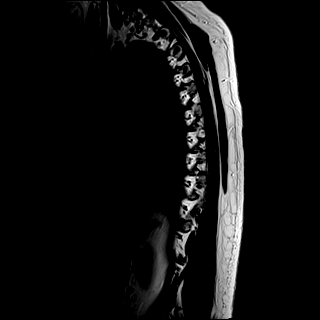

[Series 18: T1 · sagittal · 3.0mm · 1.06mm/px · 3 of 17 slices shown (2 of 3)]
[im 1/17]
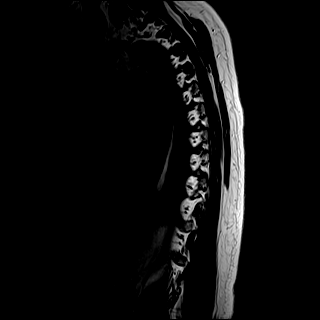
[im 9/17]
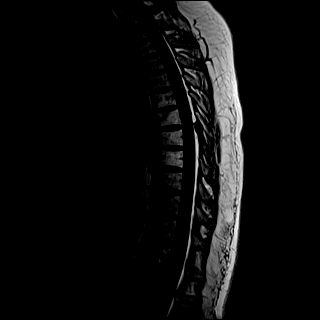
[im 17/17]
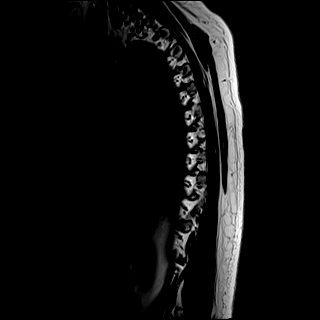

[Series 20: T2 · axial · 4.0mm · 0.59mm/px · z∈[-238,-34]mm · 8 of 40 slices shown (2 of 2)]
[im 1/40]
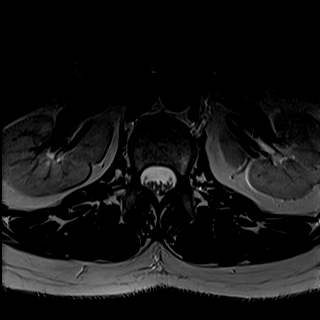
[im 6/40]
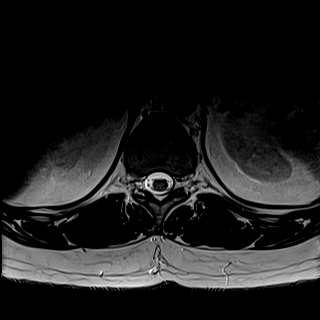
[im 12/40]
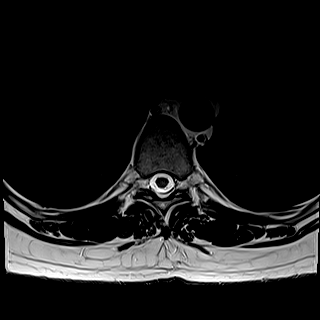
[im 17/40]
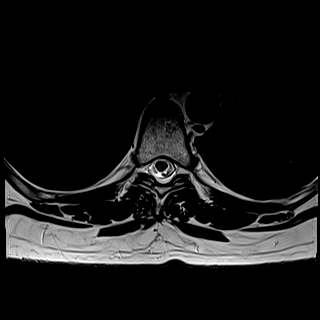
[im 23/40]
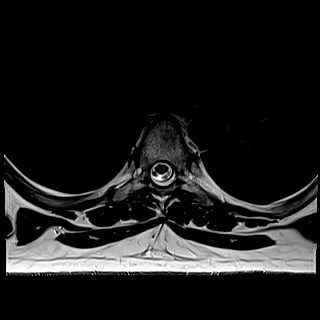
[im 28/40]
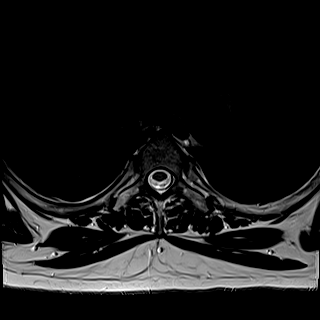
[im 34/40]
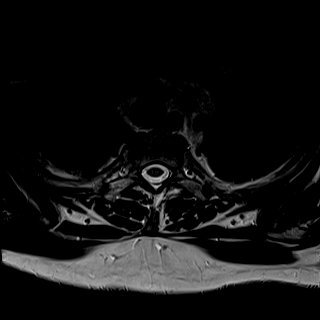
[im 40/40]
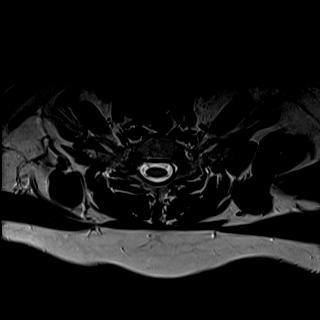

[Series 22: T1 · axial · non-contrast · 4.0mm · 0.31mm/px · z∈[-238,-34]mm · 8 of 40 slices shown (3 of 3)]
[im 1/40]
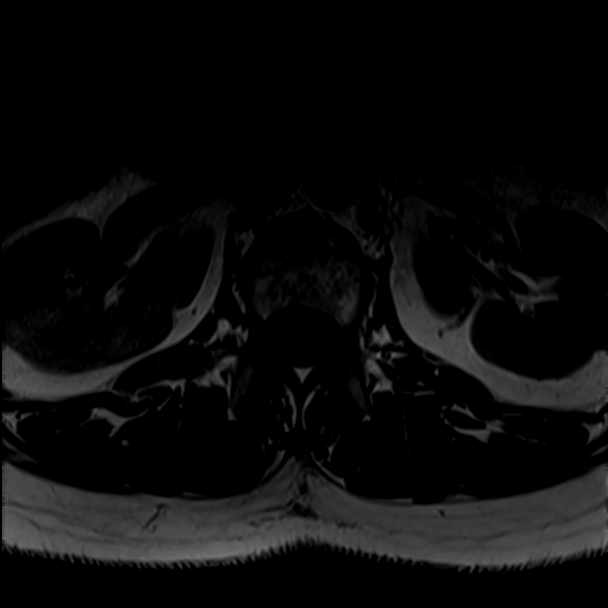
[im 6/40]
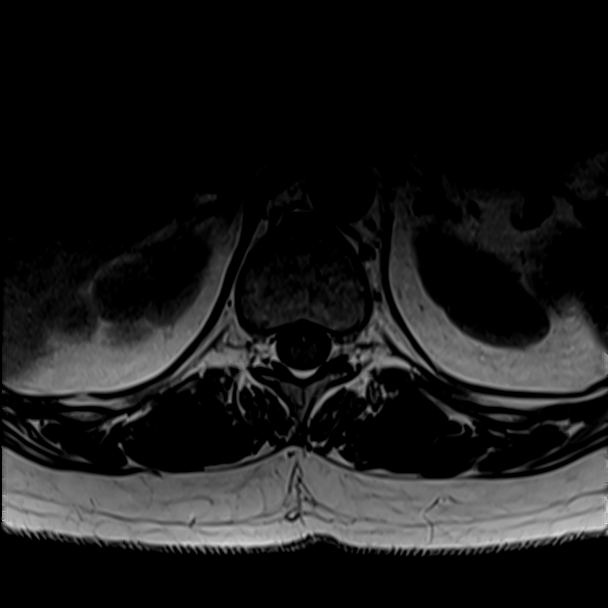
[im 12/40]
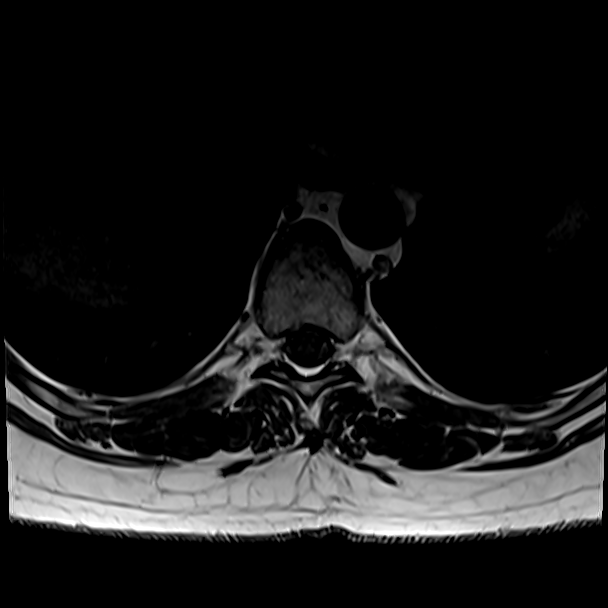
[im 17/40]
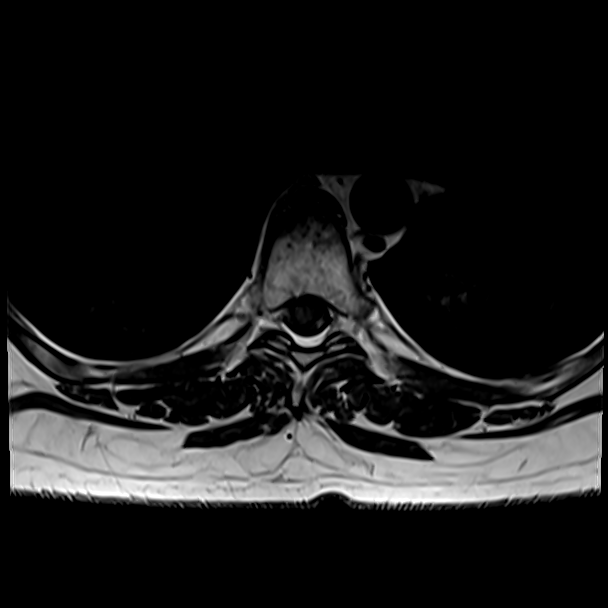
[im 23/40]
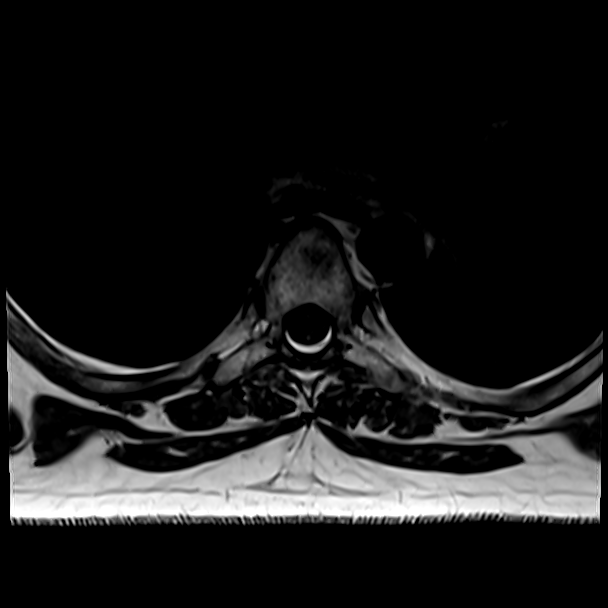
[im 28/40]
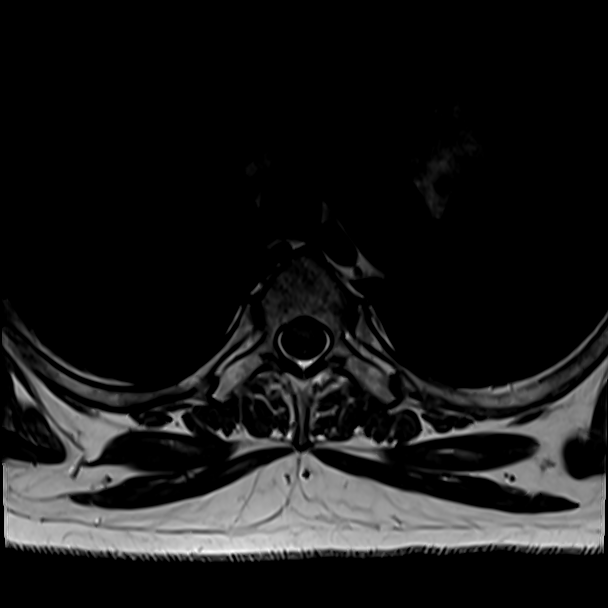
[im 34/40]
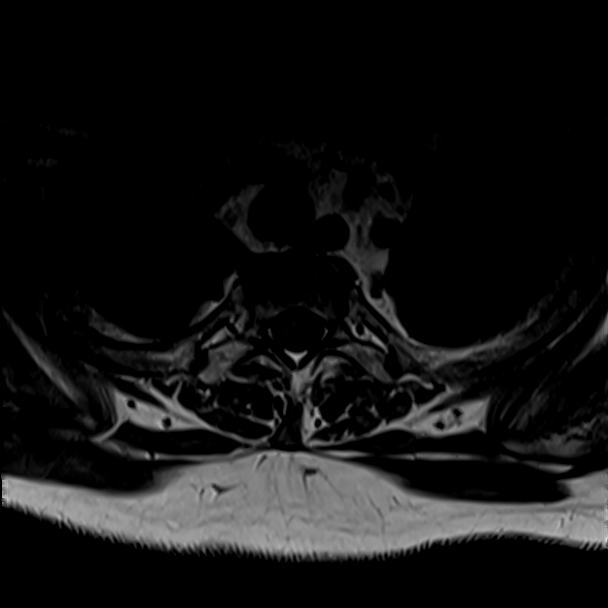
[im 40/40]
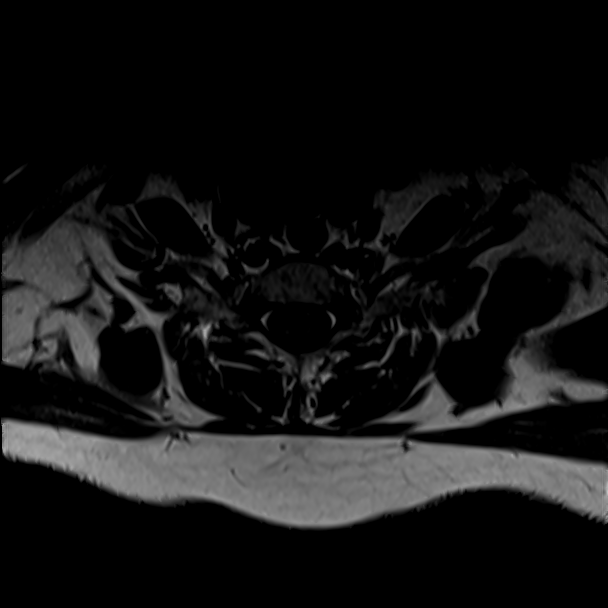

[Series 24: T1 fat-sat post-contrast · sagittal · 3.0mm · 1.06mm/px · 1 of 17 slices shown]
[im 1/17]
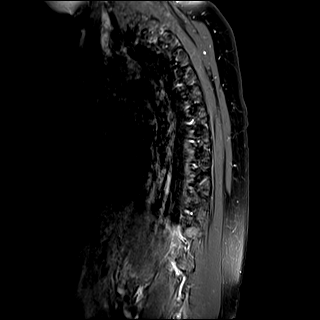

[27 of 48 positions shown; findings below may reference images not displayed]

FINDINGS: MRI THORACIC SPINE FINDINGS

Alignment: Physiologic with preservation of the normal thoracic
kyphosis. No listhesis.

Vertebrae: Vertebral body height maintained without acute or chronic
fracture. Bone marrow signal intensity within normal limits. No
discrete or worrisome osseous lesions or evidence for metastatic
disease. No evidence for locally recurrent disease about the
posterior elements of T7. No abnormal marrow edema or enhancement.

Cord: Normal signal and morphology. No abnormal enhancement or
epidural tumor.

Paraspinal and other soft tissues: Unremarkable.

Disc levels:

No significant disc pathology seen within the thoracic spine. No
disc bulge or focal disc herniation. Residual posterior element
hypertrophy present at T6-7, stable from prior. No canal or
foraminal stenosis or evidence for neural impingement.

MRI LUMBAR SPINE FINDINGS

Segmentation: Standard. Lowest well-formed disc space labeled the
L5-S1 level.

Alignment: Physiologic with preservation of the normal lumbar
lordosis. No listhesis.

Vertebrae: Vertebral body height maintained without acute or chronic
fracture. Bone marrow signal intensity within normal limits. No
discrete or worrisome osseous lesions or evidence for metastatic
disease. No abnormal marrow edema or enhancement.

Conus medullaris: Extends to the T12-L1 level and appears normal. No
abnormal enhancement.

Paraspinal and other soft tissues: Unremarkable.

Disc levels:

No significant disc pathology seen within the lumbar spine. No focal
disc herniation. No significant facet pathology. No significant
canal or lateral recess stenosis. Neural foramina remain patent. No
neural impingement.
IMPRESSION: 1. Negative MRIs of the thoracic and lumbar spine. No evidence for
locally recurrent metastasis at the level of T7. No other new
metastatic disease elsewhere with thoracolumbar spine.
2. No significant disc pathology, stenosis, or evidence for neural
impingement.

## 2022-02-18 IMAGING — MR MR LUMBAR SPINE WO/W CM
5 of 7 series · 34 of 48 positions shown · IV contrast (gadavist)
Comparison: Prior MRI from [DATE].

CLINICAL DATA: Initial evaluation for mid lower back pain. History
of metastatic breast cancer.

EXAM:
MRI THORACIC AND LUMBAR SPINE WITHOUT AND WITH CONTRAST
TECHNIQUE: Multiplanar and multiecho pulse sequences of the thoracic and lumbar
spine were obtained without and with intravenous contrast.
CONTRAST:  7.5mL GADAVIST GADOBUTROL 1 MMOL/ML IV SOLN

[Series 8: T2 · sagittal · 4.0mm · 0.81mm/px · 6 of 15 slices shown (1 of 2)]
[im 1/15]
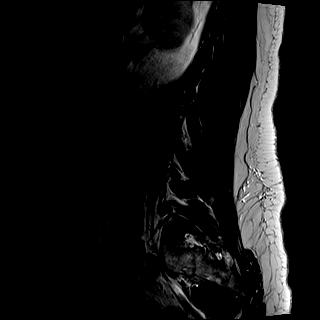
[im 3/15]
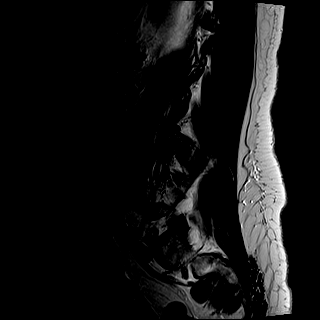
[im 6/15]
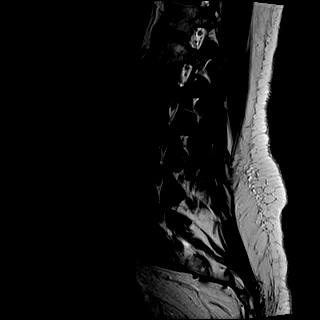
[im 9/15]
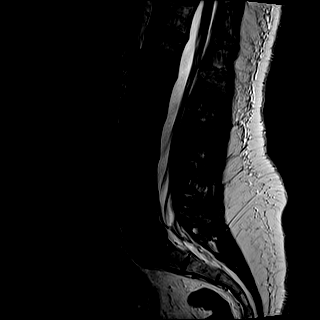
[im 12/15]
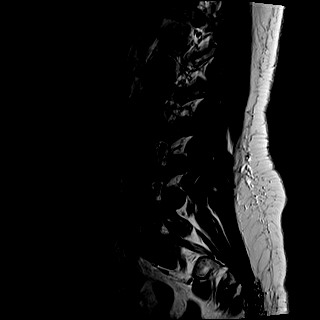
[im 15/15]
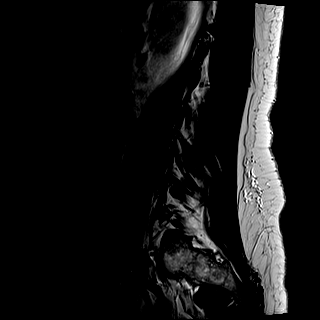

[Series 9: T1 · sagittal · 4.0mm · 0.81mm/px · 5 of 15 slices shown (1 of 2)]
[im 1/15]
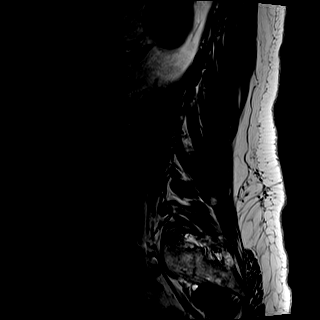
[im 4/15]
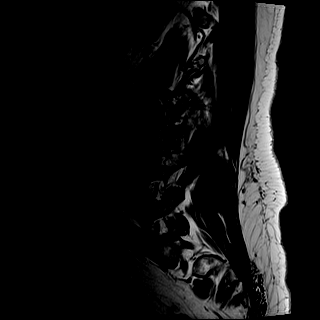
[im 8/15]
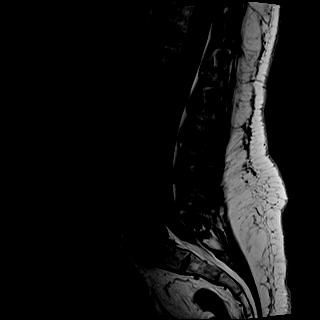
[im 11/15]
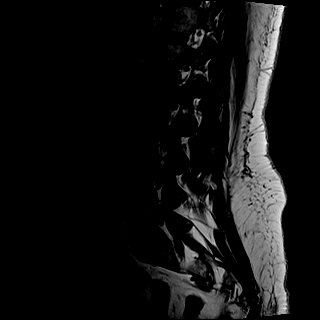
[im 15/15]
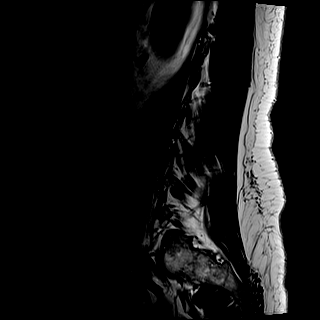

[Series 11: T2 · axial · 4.0mm · 0.78mm/px · z∈[-449,-265]mm · 9 of 29 slices shown (2 of 2)]
[im 1/29]
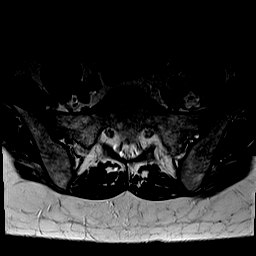
[im 4/29]
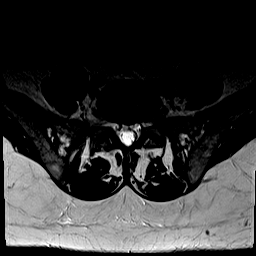
[im 8/29]
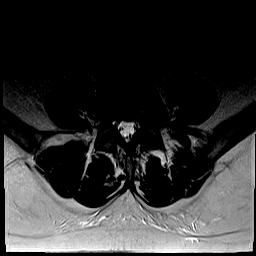
[im 11/29]
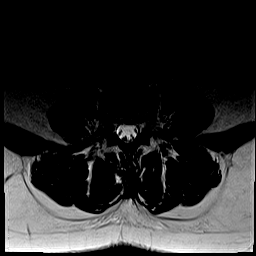
[im 15/29]
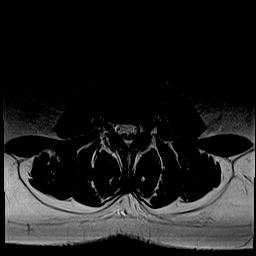
[im 18/29]
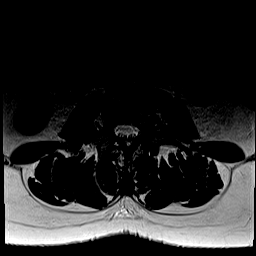
[im 22/29]
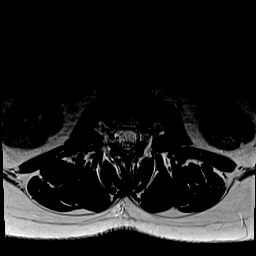
[im 25/29]
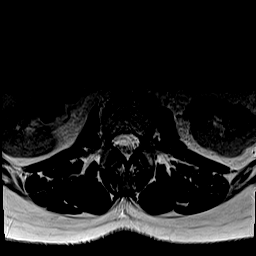
[im 29/29]
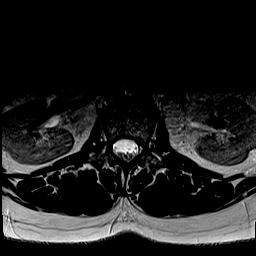

[Series 12: T1 · axial · 4.0mm · 0.39mm/px · z∈[-449,-265]mm · 9 of 29 slices shown (2 of 2)]
[im 1/29]
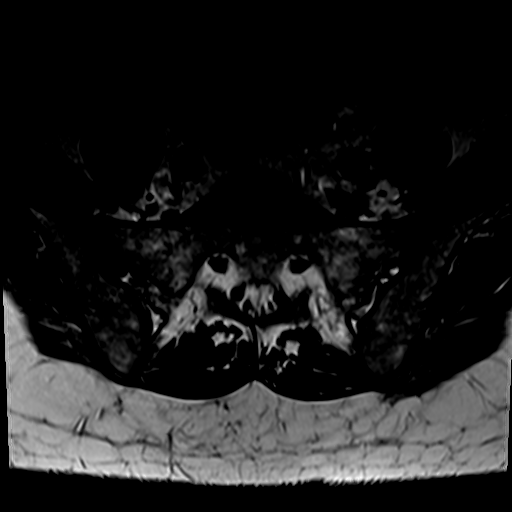
[im 4/29]
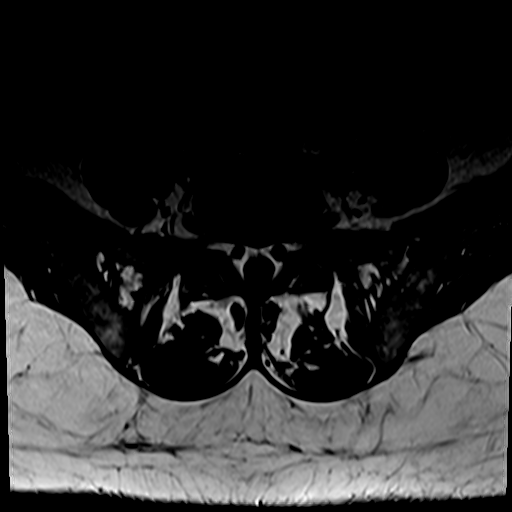
[im 8/29]
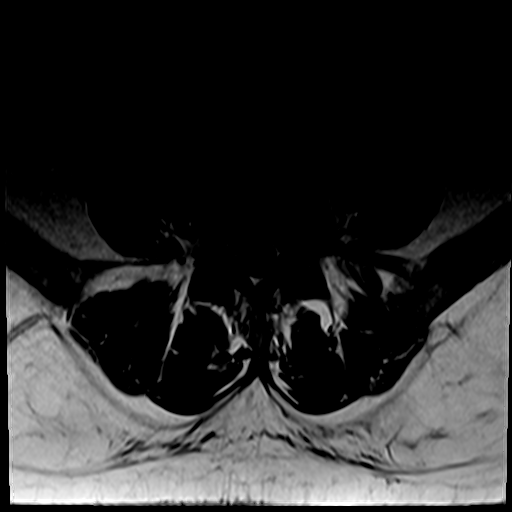
[im 11/29]
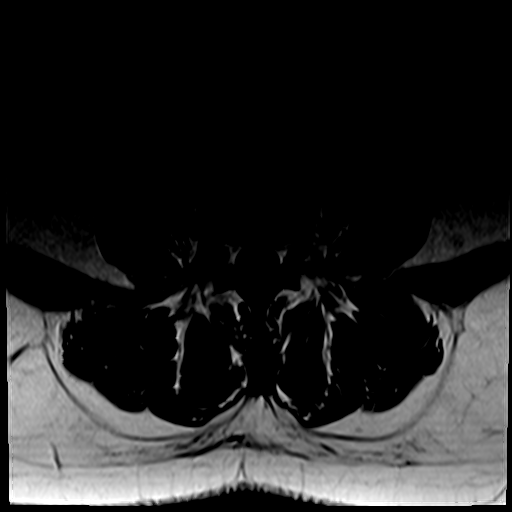
[im 15/29]
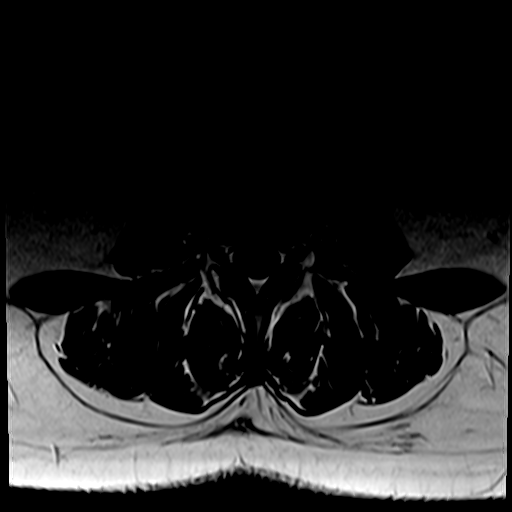
[im 18/29]
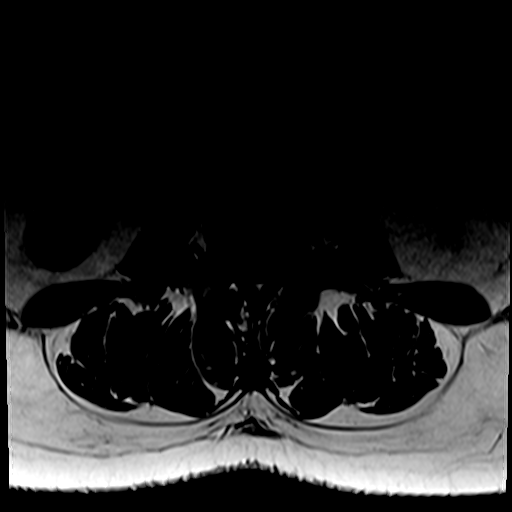
[im 22/29]
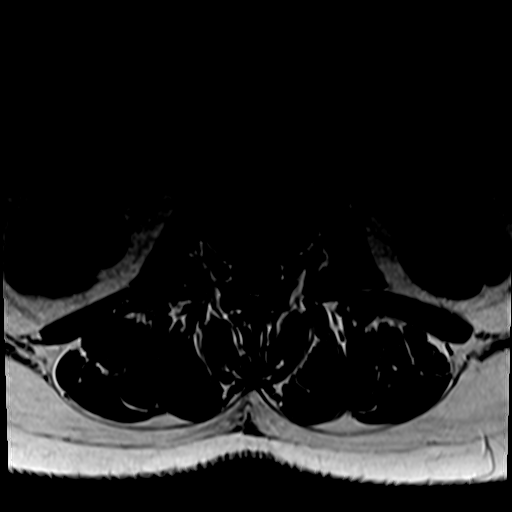
[im 25/29]
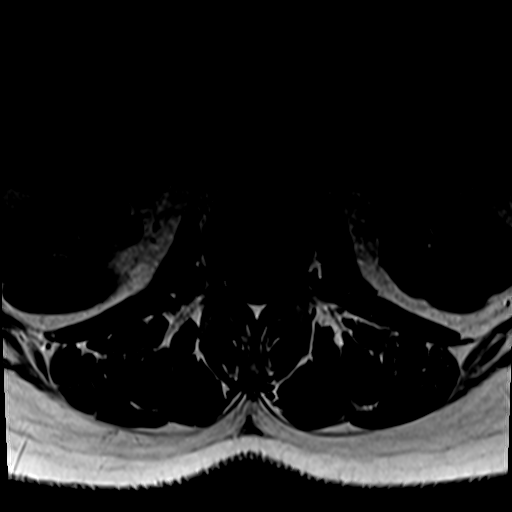
[im 29/29]
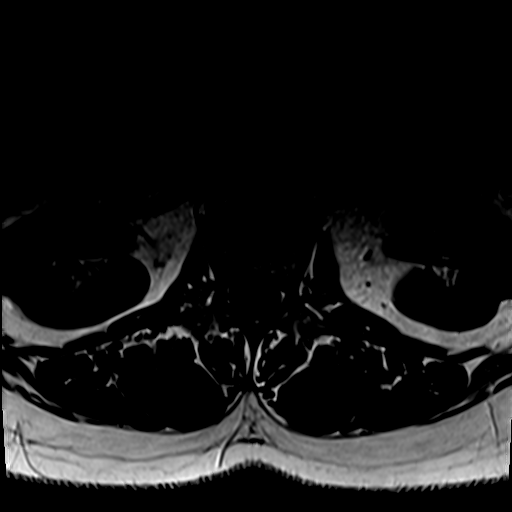

[Series 13: T1 fat-sat post-contrast · sagittal · 4.0mm · 0.81mm/px · 5 of 15 slices shown]
[im 1/15]
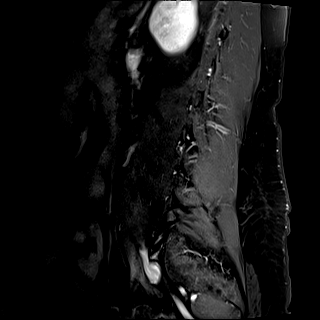
[im 4/15]
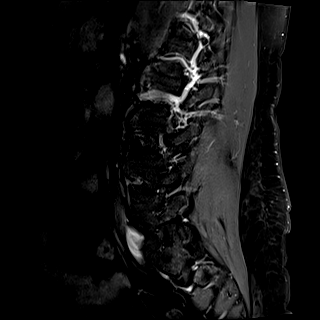
[im 8/15]
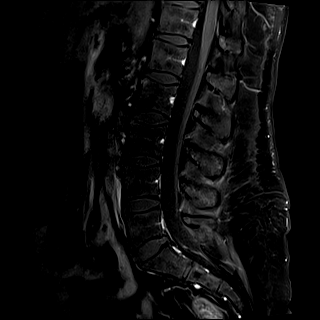
[im 11/15]
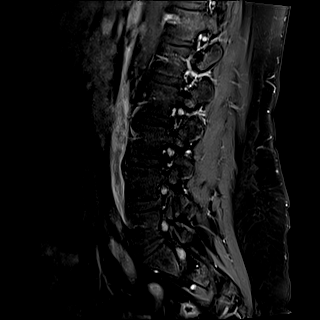
[im 15/15]
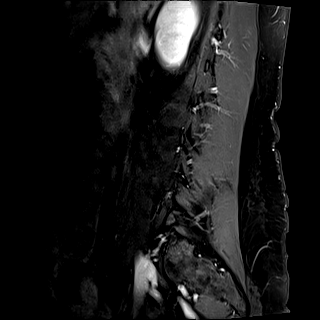

[34 of 48 positions shown; findings below may reference images not displayed]

FINDINGS: MRI THORACIC SPINE FINDINGS

Alignment: Physiologic with preservation of the normal thoracic
kyphosis. No listhesis.

Vertebrae: Vertebral body height maintained without acute or chronic
fracture. Bone marrow signal intensity within normal limits. No
discrete or worrisome osseous lesions or evidence for metastatic
disease. No evidence for locally recurrent disease about the
posterior elements of T7. No abnormal marrow edema or enhancement.

Cord: Normal signal and morphology. No abnormal enhancement or
epidural tumor.

Paraspinal and other soft tissues: Unremarkable.

Disc levels:

No significant disc pathology seen within the thoracic spine. No
disc bulge or focal disc herniation. Residual posterior element
hypertrophy present at T6-7, stable from prior. No canal or
foraminal stenosis or evidence for neural impingement.

MRI LUMBAR SPINE FINDINGS

Segmentation: Standard. Lowest well-formed disc space labeled the
L5-S1 level.

Alignment: Physiologic with preservation of the normal lumbar
lordosis. No listhesis.

Vertebrae: Vertebral body height maintained without acute or chronic
fracture. Bone marrow signal intensity within normal limits. No
discrete or worrisome osseous lesions or evidence for metastatic
disease. No abnormal marrow edema or enhancement.

Conus medullaris: Extends to the T12-L1 level and appears normal. No
abnormal enhancement.

Paraspinal and other soft tissues: Unremarkable.

Disc levels:

No significant disc pathology seen within the lumbar spine. No focal
disc herniation. No significant facet pathology. No significant
canal or lateral recess stenosis. Neural foramina remain patent. No
neural impingement.
IMPRESSION: 1. Negative MRIs of the thoracic and lumbar spine. No evidence for
locally recurrent metastasis at the level of T7. No other new
metastatic disease elsewhere with thoracolumbar spine.
2. No significant disc pathology, stenosis, or evidence for neural
impingement.

## 2022-02-18 MED ORDER — GADOBUTROL 1 MMOL/ML IV SOLN
7.5000 mL | Freq: Once | INTRAVENOUS | Status: AC | PRN
  Administered 2022-02-18: 7.5 mL via INTRAVENOUS

## 2022-02-25 ENCOUNTER — Ambulatory Visit (INDEPENDENT_AMBULATORY_CARE_PROVIDER_SITE_OTHER): Payer: Medicare HMO

## 2022-02-25 ENCOUNTER — Other Ambulatory Visit: Payer: Self-pay

## 2022-02-25 DIAGNOSIS — I428 Other cardiomyopathies: Secondary | ICD-10-CM | POA: Diagnosis not present

## 2022-02-25 DIAGNOSIS — I427 Cardiomyopathy due to drug and external agent: Secondary | ICD-10-CM

## 2022-02-25 DIAGNOSIS — T451X5D Adverse effect of antineoplastic and immunosuppressive drugs, subsequent encounter: Secondary | ICD-10-CM

## 2022-02-25 DIAGNOSIS — T451X5A Adverse effect of antineoplastic and immunosuppressive drugs, initial encounter: Secondary | ICD-10-CM

## 2022-02-25 LAB — ECHOCARDIOGRAM COMPLETE
AR max vel: 3.02 cm2
AV Area VTI: 3.36 cm2
AV Area mean vel: 3.26 cm2
AV Mean grad: 3 mmHg
AV Peak grad: 5.5 mmHg
Ao pk vel: 1.17 m/s
Area-P 1/2: 3.5 cm2
S' Lateral: 3.9 cm
Single Plane A4C EF: 53.3 %

## 2022-02-25 MED ORDER — PERFLUTREN LIPID MICROSPHERE
1.0000 mL | INTRAVENOUS | Status: AC | PRN
  Administered 2022-02-25: 2 mL via INTRAVENOUS

## 2022-02-28 ENCOUNTER — Telehealth: Payer: Self-pay

## 2022-02-28 ENCOUNTER — Telehealth: Payer: Self-pay | Admitting: *Deleted

## 2022-02-28 NOTE — Telephone Encounter (Signed)
-----   Message from Rise Mu, PA-C sent at 02/26/2022  7:13 AM EDT ----- ?Please inform the patient her echo shows her pump function has improved and is back to normal. The wall motion is normal, the heart is mildly stiff, there are no significant valvular abnormalities, and the pressure in the right side of the heart is normal. Overall, great news. We will discuss resumption of oncology treatment at her visit this upcoming week.  ?

## 2022-02-28 NOTE — Telephone Encounter (Signed)
Received call from the PET department saying that there are delays in the reopening and pt needs to be r/s. Please contact pt to r/s either to [facility] asap or if they want it done here, they said it will be approx mid may.  ?

## 2022-02-28 NOTE — Telephone Encounter (Signed)
LVM about moving appt to [facility] left CB number to call if need be she looks at [facility] ?

## 2022-02-28 NOTE — Telephone Encounter (Signed)
Reviewed results, recommendations, and confirmed upcoming appointment. She verbalized understanding with no further questions at this time.  

## 2022-02-28 NOTE — Telephone Encounter (Signed)
Left voicemail message to call back for review of results.  

## 2022-03-03 NOTE — Progress Notes (Signed)
? ?Cardiology Office Note   ? ?Date:  03/04/2022  ? ?ID:  Theresa Chaney, DOB 1981-06-12, MRN [id number] ? ?PCP:  Marguerita Merles, MD  ?Cardiologist:  Nelva Bush, MD  ?Electrophysiologist:  None  ? ?Chief Complaint: Follow-up ? ?History of Present Illness:  ? ?Theresa Chaney is a 41 y.o. female with history of recurrent breast cancer with isolated bone metastasis s/p mastectomy and chemoradiation complicated by a mildly reduced LVEF, WCT, paroxysmal SVT, lymphedema, COPD, anemia, migraine disorder, and GERD who presents for follow up of echo. ?  ?She was previously evaluated in 10/2017 for intermittent sinus tachycardia and DOE. At that time echoes in 10/2017 and 04/2018 showed normal LV function and it was ultimately felt her symptoms were secondary to chemotherapy. In 08/2018, she experienced upper extremity swelling with repeat echo at that time showing an EF of 45% with question of anteroseptal hypokinesis and moderate right ventricular enlargement. Upon her primary cardiologist reviewing the echo, it was felt her EF was closer to 50-55%. She was placed on Toprol XL. Lexiscan Myoview in 10/2018 showed a medium defect of moderate severity present in the the basal anterior and mild anterior location, EF 55-65%. This was a very suboptimal study due to breast attenuation artifact as well as extracardiac uptake at the inferior border of the heart. There was reversible anterior wall defect suggestive of ischemia, though it was felt this was likely artifact given the defect did not extend to the apex. She was seen in the office in 11/2018 for follow up and was feeling well outside of intermittent palpitations over the prior couple of weeks with report of heart rates of 150 bpm. Episodes would typically last a few minutes. In this setting, she wore a Zio monitor that showed the predominant rhythm was sinus with an average heart rate of 82 bpm (range 48-175 bpm). Rare PACs and PVCs were noted with a single atrial run  lasting 4 beats. There were no sustained arrhythmias or prolonged pauses. Patient triggered events corresponded to sinus rhythm and artifact. In this setting, her Toprol XL was increased to 25 mg. She underwent echo on 02/19/2019 which showed improvement in her EF to 56-38%, normal diastolic function, and no significant valvular abnormalities. She was seen in the office in 02/2019 for presyncope. Repeat cardiac monitoring at that time showed a predominant rhythm of sinus with an average heart rate of 88 bpm (range 47-181 bpm in sinus), 2 episodes of WCT favored to be NSVT over SVT with aberrancy lasting up to 7 beats, and rare PACs/PVCs. She has continued to undergo periodic echoes for chemotherapy evaluation as outlined below.  ?  ?She has undergone periodic echocardiograms for chemotherapy monitoring which have demonstrated:  ?  ?  ?Echo in 10/2017 demonstrated an EF of 60 to 65%, normal wall motion, grade 1 diastolic dysfunction, normal RV systolic function, normal PASP, and no significant valvular abnormalities. ?  ?Echo in 04/2018 showed an EF of 55 to 60%, normal wall motion, normal LV diastolic function. ?  ?Echo in 08/2018 showed an EF of 45%, diffuse hypokinesis, grade 1 diastolic dysfunction, mild mitral regurgitation, mild biatrial enlargement, and moderately dilated RV. ?  ?Echo in 01/2020 demonstrated an EF of 60 to 65%, no regional wall motion abnormalities, normal RV systolic function and ventricular cavity size, and no significant valvular abnormalities.   ?  ?Echo in 09/2020 continues to show a preserved LV systolic function with an EF of 60 to 93%, grade 1 diastolic dysfunction,  normal RV systolic function and ventricular cavity size, and no significant valvular abnormalities.   ?  ?Echo in 01/2021 demonstrated an EF of 55-60%, no RWMA, normal RV systolic function and ventricular cavity size, and no significant valvular abnormalities.  ?  ?Echo in 04/2021 showed an EF of 55 to 60%, no regional wall motion  abnormalities, normal RV systolic function and ventricular cavity size, and no significant valvular abnormalities. ?  ?Echo in 08/2021 showed a low normal LV systolic function with an EF of 50 to 55%, normal LV diastolic function parameters, normal RV systolic function and ventricular cavity size, and no significant valvular abnormalities.   ?  ?Echo from 09/28/2021 demonstrated an EF of 45%, global hypokinesis, normal RV systolic function and ventricular cavity size, and trivial mitral regurgitation.   ?  ?Following noted drop in LV systolic function, trastuzumab and pertuzumab were held.  She was seen in follow-up in 09/2021 with relative hypotension precluding escalation of GDMT.  He underwent repeat echo in 11/2021 which demonstrated an EF of 45 to 50%, normal RV systolic function and ventricular cavity size, and no significant valvular abnormalities.  Given persistent mild LV dysfunction, Toprol was decreased to 12.5 mg and she was initiated on losartan 12.5 mg.  Subsequent repeat echo on 02/25/2022 demonstrated an improvement in her LV systolic function with an EF of 55 to 60%, no regional wall motion abnormalities, grade 1 diastolic dysfunction, normal RV systolic function and ventricular cavity size, no significant valvular abnormalities, and an estimated right atrial pressure of 3 mmHg. ? ?She comes in doing reasonably well from a cardiac perspective.  She is without symptoms of angina or decompensation.  She does continue to note fatigue and some positional dizziness with symptoms being largely stable.  With previously recommended increase in fluid intake, based on the symptoms, she did note swelling leading her to taper back off fluid intake to prior baseline.  With this, she has noted some improvement in her swelling.  Her weight is noted to be up 11 pounds today when compared to her prior clinic visit.  Currently without lower extremity swelling, abdominal distention, or orthopnea.  She has a follow-up  PET scan with oncology scheduled for 4/5 with follow-up with them later that day.  She is tolerating Toprol-XL and losartan. ? ? ?Labs independently reviewed: ?02/2022 - magnesium 2.0, Hgb 11.6, PLT 278, potassium 3.3, BUN 16, serum creatinine 0.83, albumin 4.1, AST/ALT normal ?07/2021 - TSH normal ? ?Past Medical History:  ?Diagnosis Date  ? Anemia   ? Arthritis   ? BRCA negative 11/26/2017  ? Breast cancer ([place]) 10/11/2017  ? Multifocal, ER positive, PR negative, HER-2 negative. ypT3 ypN2a 8.7 cm, 4/15 nodes  ? Cardiomyopathy ([place])   ? a. 10/2017 Echo: EF 60-65%, no rwma, Gr1 DD, nl RV size/fxn; b. 04/2018 Echo: EF 55-60%, no rwma, Nl RV size/fxn; c. 08/2018 Echo: EF 45%, diff HK, ? HK of antsept wall. Gr1 DD. Mild MR. Mild LAE/RAE. Mod dil RV.   ? Chronic bronchitis ([place]) 11/2017  ? COPD (chronic obstructive pulmonary disease) ([place])   ? MILD PER CXR  ? Depression   ? Family history of cancer   ? GERD (gastroesophageal reflux disease)   ? Headache   ? MIGRAINES  ? Heart murmur   ? ASYMPTOMATIC  ? Personal history of chemotherapy   ? current for right breast ca  ? ? ?Past Surgical History:  ?Procedure Laterality Date  ? AXILLARY LYMPH NODE DISSECTION Right 03/19/2018  ?  Procedure: AXILLARY LYMPH NODE DISSECTION;  Surgeon: Robert Bellow, MD;  Location: [facility] OR[facility];  Service: General;  Laterality: Right;  ? BREAST BIOPSY Right 10/11/2017  ? 12:30 posterior coil clip invasive mammary carcinoma  ? BREAST BIOPSY Right 10/11/2017  ? 11:30 middle depth ribbon clip DCIS  ? BREAST BIOPSY Right 10/11/2017  ? 5:30 anterior depth x shape invasive ductal carcinoma  ? BREAST IMPLANT REMOVAL Right 06/03/2019  ? Procedure: REMOVAL OF RIGHT BREAST IMPLANTS;  Surgeon: Wallace Going, DO;  Location: [facility];  Service: Plastics;  Laterality: Right;  ? BREAST RECONSTRUCTION WITH PLACEMENT OF TISSUE EXPANDER AND FLEX HD (ACELLULAR HYDRATED DERMIS) Right 03/19/2018  ? Procedure: BREAST RECONSTRUCTION WITH PLACEMENT OF TISSUE EXPANDER AND  FLEX HD (ACELLULAR HYDRATED DERMIS);  Surgeon: Wallace Going, DO;  Location: [facility];  Service: Plastics;  Laterality: Right;  ? CARPAL TUNNEL RELEASE Bilateral 2020  ? CHOLECYSTECTOMY N/A 5/17/2

## 2022-03-04 ENCOUNTER — Encounter: Payer: Self-pay | Admitting: Physician Assistant

## 2022-03-04 ENCOUNTER — Other Ambulatory Visit: Payer: Self-pay

## 2022-03-04 ENCOUNTER — Ambulatory Visit: Payer: Medicare HMO | Admitting: Physician Assistant

## 2022-03-04 VITALS — BP 110/70 | HR 110 | Ht 67.0 in | Wt 188.0 lb

## 2022-03-04 DIAGNOSIS — I428 Other cardiomyopathies: Secondary | ICD-10-CM

## 2022-03-04 DIAGNOSIS — C50411 Malignant neoplasm of upper-outer quadrant of right female breast: Secondary | ICD-10-CM | POA: Diagnosis not present

## 2022-03-04 DIAGNOSIS — I427 Cardiomyopathy due to drug and external agent: Secondary | ICD-10-CM

## 2022-03-04 DIAGNOSIS — R002 Palpitations: Secondary | ICD-10-CM | POA: Diagnosis not present

## 2022-03-04 DIAGNOSIS — R000 Tachycardia, unspecified: Secondary | ICD-10-CM | POA: Diagnosis not present

## 2022-03-04 DIAGNOSIS — I471 Supraventricular tachycardia: Secondary | ICD-10-CM | POA: Diagnosis not present

## 2022-03-04 DIAGNOSIS — T451X5D Adverse effect of antineoplastic and immunosuppressive drugs, subsequent encounter: Secondary | ICD-10-CM

## 2022-03-04 DIAGNOSIS — Z170 Estrogen receptor positive status [ER+]: Secondary | ICD-10-CM

## 2022-03-04 NOTE — Patient Instructions (Signed)
Medication Instructions:  ?No changes at this time.  ? ?*If you need a refill on your cardiac medications before your next appointment, please call your pharmacy* ? ? ?Lab Work: ?None ? ?If you have labs (blood work) drawn today and your tests are completely normal, you will receive your results only by: ?MyChart Message (if you have MyChart) OR ?A paper copy in the mail ?If you have any lab test that is abnormal or we need to change your treatment, we will call you to review the results. ? ? ?Testing/Procedures: ?Your physician has requested that you have a limited echocardiogram between May 10-15 th. If they should NOT restart chemotherapy then give [place] a call to cancel.  ? ? Echocardiography is a painless test that uses sound waves to create images of your heart. It provides your doctor with information about the size and shape of your heart and how well your heart?s chambers and valves are working. This procedure takes approximately one hour. There are no restrictions for this procedure. ? ? ? ?Follow-Up: ?At [facility], you and your health needs are our priority.  As part of our continuing mission to provide you with exceptional heart care, we have created designated Provider Care Teams.  These Care Teams include your primary Cardiologist (physician) and Advanced Practice Providers (APPs -  Physician Assistants and Nurse Practitioners) who all work together to provide you with the care you need, when you need it. ? ? ?Your next appointment:   ?3 month(s) ? ?The format for your next appointment:   ?In Person ? ?Provider:   ?Nelva Bush, MD or Christell Faith, PA-C ?

## 2022-03-14 ENCOUNTER — Ambulatory Visit: Payer: Medicare HMO | Admitting: Oncology

## 2022-03-14 ENCOUNTER — Inpatient Hospital Stay: Payer: Medicare HMO

## 2022-03-14 ENCOUNTER — Other Ambulatory Visit: Payer: Medicare HMO

## 2022-03-16 ENCOUNTER — Inpatient Hospital Stay: Payer: Medicare HMO

## 2022-03-16 ENCOUNTER — Ambulatory Visit (HOSPITAL_COMMUNITY)
Admission: RE | Admit: 2022-03-16 | Discharge: 2022-03-16 | Disposition: A | Discharge status: Home / Self Care | Payer: Medicare HMO | Source: Ambulatory Visit | Attending: Oncology | Admitting: Oncology

## 2022-03-16 ENCOUNTER — Inpatient Hospital Stay: Payer: Medicare HMO | Admitting: Oncology

## 2022-03-16 ENCOUNTER — Inpatient Hospital Stay: Payer: Medicare HMO | Attending: Oncology

## 2022-03-16 VITALS — BP 103/61 | HR 103 | Temp 97.1°F | Resp 18

## 2022-03-16 DIAGNOSIS — C50411 Malignant neoplasm of upper-outer quadrant of right female breast: Secondary | ICD-10-CM | POA: Diagnosis present

## 2022-03-16 DIAGNOSIS — C50919 Malignant neoplasm of unspecified site of unspecified female breast: Secondary | ICD-10-CM

## 2022-03-16 DIAGNOSIS — Z79899 Other long term (current) drug therapy: Secondary | ICD-10-CM | POA: Diagnosis not present

## 2022-03-16 DIAGNOSIS — C7951 Secondary malignant neoplasm of bone: Secondary | ICD-10-CM | POA: Diagnosis present

## 2022-03-16 DIAGNOSIS — Z95828 Presence of other vascular implants and grafts: Secondary | ICD-10-CM

## 2022-03-16 DIAGNOSIS — Z5111 Encounter for antineoplastic chemotherapy: Secondary | ICD-10-CM | POA: Insufficient documentation

## 2022-03-16 DIAGNOSIS — Z170 Estrogen receptor positive status [ER+]: Secondary | ICD-10-CM

## 2022-03-16 LAB — COMPREHENSIVE METABOLIC PANEL
ALT: 17 U/L (ref 0–44)
AST: 25 U/L (ref 15–41)
Albumin: 4.2 g/dL (ref 3.5–5.0)
Alkaline Phosphatase: 65 U/L (ref 38–126)
Anion gap: 4 — ABNORMAL LOW (ref 5–15)
BUN: 18 mg/dL (ref 6–20)
CO2: 25 mmol/L (ref 22–32)
Calcium: 8.7 mg/dL — ABNORMAL LOW (ref 8.9–10.3)
Chloride: 108 mmol/L (ref 98–111)
Creatinine, Ser: 0.82 mg/dL (ref 0.44–1.00)
GFR, Estimated: >60 mL/min (ref >60)
Glucose, Bld: 142 mg/dL — ABNORMAL HIGH (ref 70–99)
Potassium: 3.3 mmol/L — ABNORMAL LOW (ref 3.5–5.1)
Sodium: 137 mmol/L (ref 135–145)
Total Bilirubin: 0.4 mg/dL (ref 0.3–1.2)
Total Protein: 7.1 g/dL (ref 6.5–8.1)

## 2022-03-16 LAB — CBC WITH DIFFERENTIAL/PLATELET
Abs Immature Granulocytes: 0.04 10*3/uL (ref 0.00–0.07)
Basophils Absolute: 0 10*3/uL (ref 0.0–0.1)
Basophils Relative: 0 %
Eosinophils Absolute: 0.1 10*3/uL (ref 0.0–0.5)
Eosinophils Relative: 1 %
HCT: 32.2 % — ABNORMAL LOW (ref 36.0–46.0)
Hemoglobin: 10.9 g/dL — ABNORMAL LOW (ref 12.0–15.0)
Immature Granulocytes: 0 %
Lymphocytes Relative: 26 %
Lymphs Abs: 2.4 10*3/uL (ref 0.7–4.0)
MCH: 30.4 pg (ref 26.0–34.0)
MCHC: 33.9 g/dL (ref 30.0–36.0)
MCV: 89.7 fL (ref 80.0–100.0)
Monocytes Absolute: 0.6 10*3/uL (ref 0.1–1.0)
Monocytes Relative: 6 %
Neutro Abs: 6.1 10*3/uL (ref 1.7–7.7)
Neutrophils Relative %: 67 %
Platelets: 271 10*3/uL (ref 150–400)
RBC: 3.59 MIL/uL — ABNORMAL LOW (ref 3.87–5.11)
RDW: 13.5 % (ref 11.5–15.5)
WBC: 9.3 10*3/uL (ref 4.0–10.5)
nRBC: 0 % (ref 0.0–0.2)

## 2022-03-16 LAB — MAGNESIUM: Magnesium: 2 mg/dL (ref 1.7–2.4)

## 2022-03-16 LAB — GLUCOSE, CAPILLARY: Glucose-Capillary: 99 mg/dL (ref 70–99)

## 2022-03-16 IMAGING — CT NM PET TUM IMG RESTAG (PS) SKULL BASE T - THIGH
7 of 8 series · 23 of 25 positions shown · non-contrast
Comparison: Multiple previous PET CTs. The most recent is
[DATE]

CLINICAL DATA: Subsequent treatment strategy for breast cancer.

EXAM:
NUCLEAR MEDICINE PET SKULL BASE TO THIGH
TECHNIQUE: 7.3 mCi F-18 FDG was injected intravenously. Full-ring PET imaging
was performed from the skull base to thigh after the radiotracer. CT
data was obtained and used for attenuation correction and anatomic
localization.
Fasting blood glucose: 99 mg/dl

[Series 3: pet sk_thigh ac · axial · 5.0mm · 4.07mm/px · z∈[-946,-78]mm · 6 of 218 slices shown]
[im 1/218]
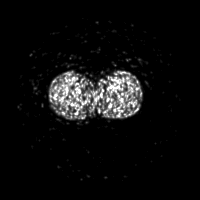
[im 44/218]
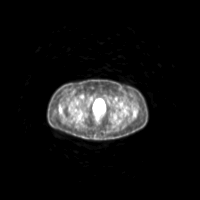
[im 87/218]
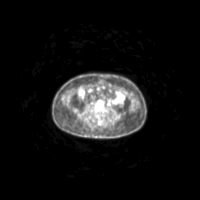
[im 131/218]
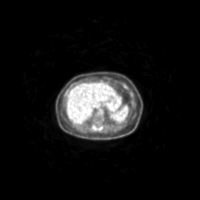
[im 174/218]
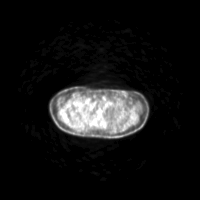
[im 218/218]
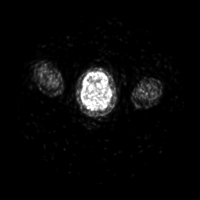

[Series 4: ct sk_thigh 5.0 bf37 · axial · 5.0mm · 0.98mm/px · z∈[-730,-78]mm · 4 of 218 slices shown]
[im 55/218]
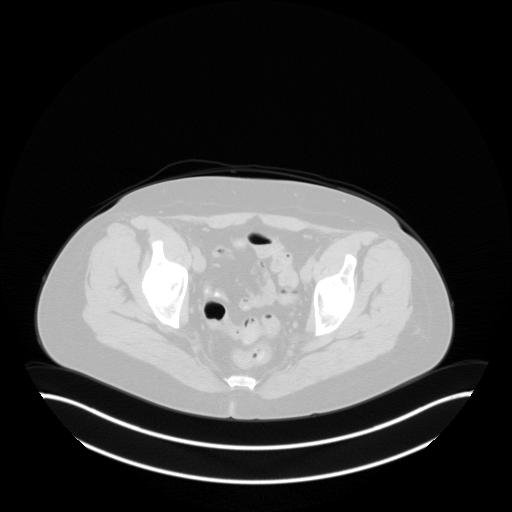
[im 109/218]
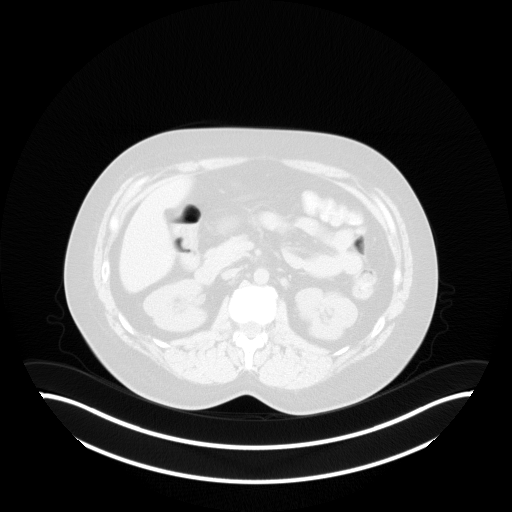
[im 163/218]
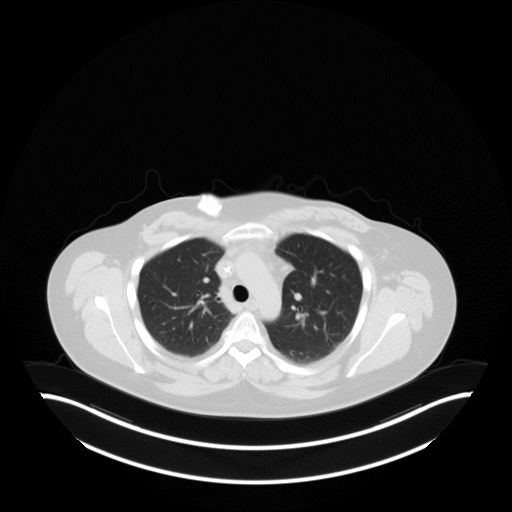
[im 218/218  brain]
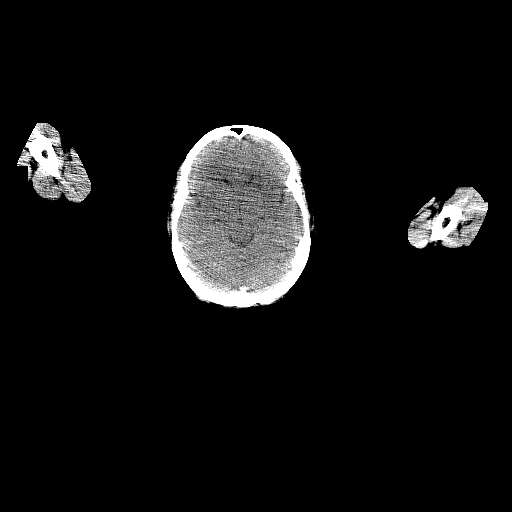

[Series 5: pet sk_thigh nac · axial · 5.0mm · 4.07mm/px · z∈[-946,-78]mm · 5 of 218 slices shown]
[im 1/218]
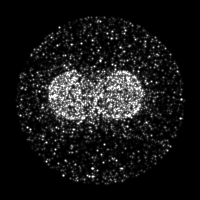
[im 55/218]
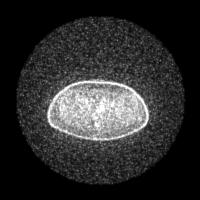
[im 109/218]
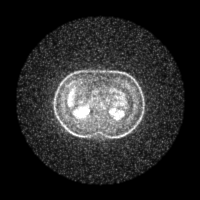
[im 163/218]
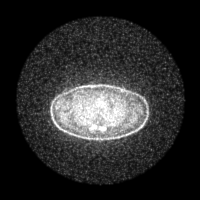
[im 218/218]
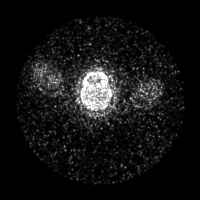

[Series 8: ct sk_thigh 5.0 br59 lung_bone · axial · 5.0mm · 0.63mm/px · 1 of 55 slices shown]
[im 1/55  brain]
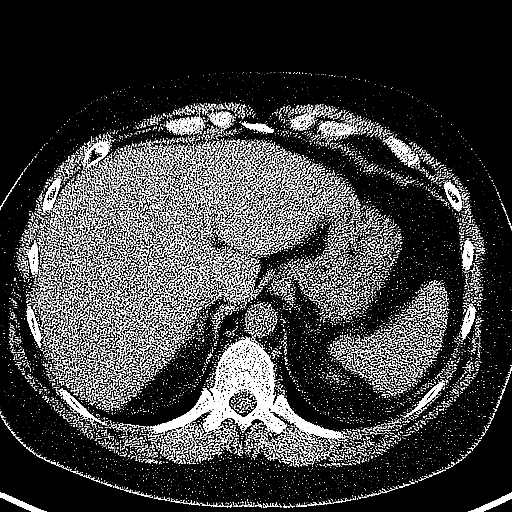

[Series 603: <mip collection> · coronal · 1.80mm/px · 1 of 32 slices shown]
[im 1/32]
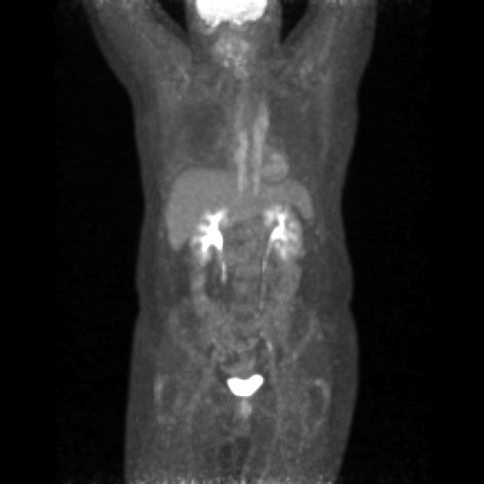

[Series 605: range-ct sk_thigh 5.0 bf37-tra-<alpha range> · 5 of 213 slices shown]
[im 1/213]
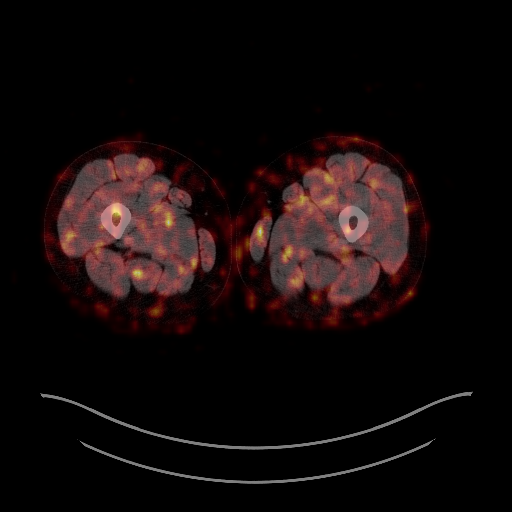
[im 54/213]
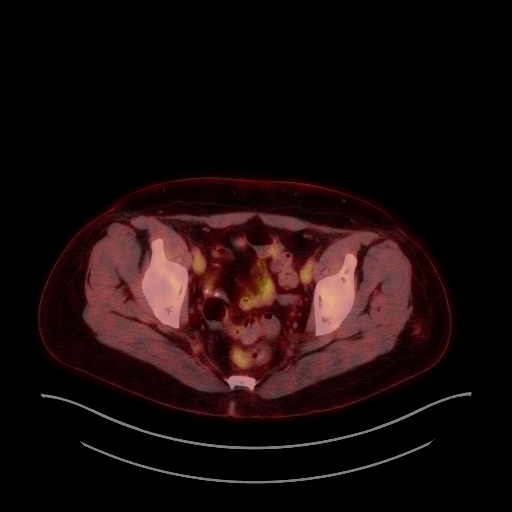
[im 107/213]
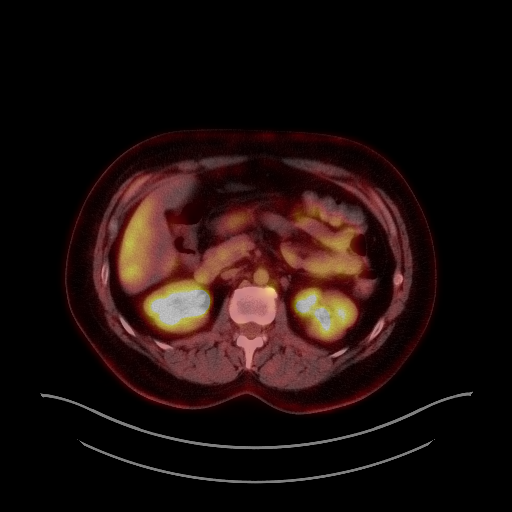
[im 160/213]
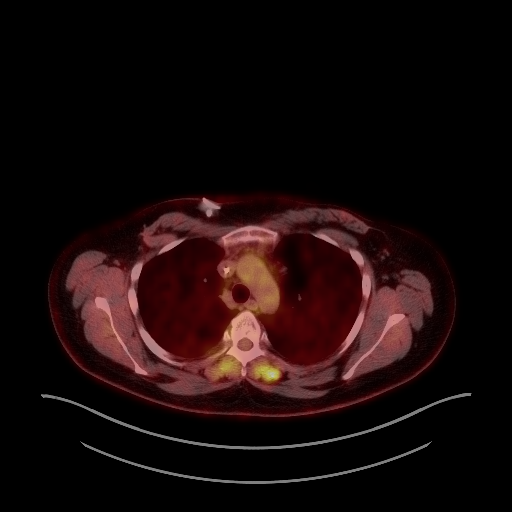
[im 213/213]
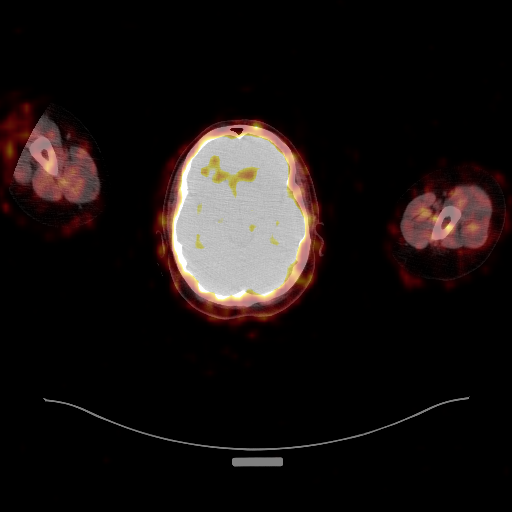

[Series 8860: results mm oncology reading · 5.0mm · 0.50mm/px · 1 of 1 slices shown]
[im 1/1]
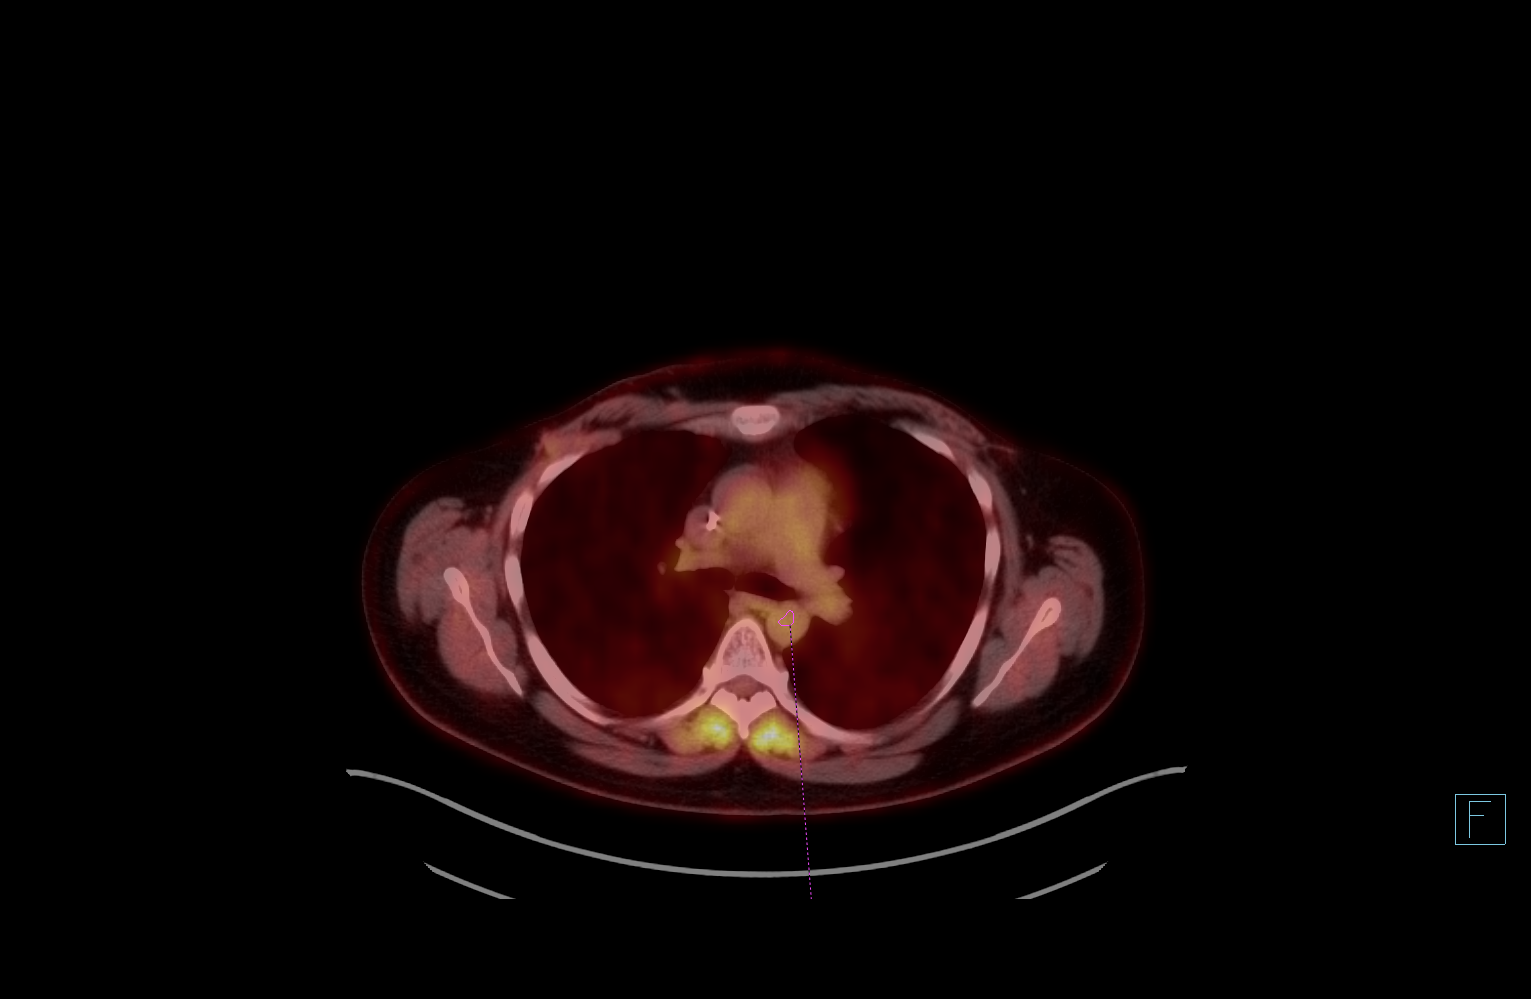

[23 of 25 positions shown; findings below may reference images not displayed]

FINDINGS: Mediastinal blood pool activity: SUV max

Liver activity: SUV max NA

NECK: No hypermetabolic lymph nodes in the neck.

Incidental CT findings: none

CHEST: No hypermetabolic mediastinal or hilar nodes. No suspicious
pulmonary nodules on the CT scan.

Bilateral mastectomies are noted. No hypermetabolic chest wall
lesions to suggest recurrent breast cancer. No enlarged or
hypermetabolic supraclavicular or axillary nodes.

Incidental CT findings: The right-sided Port-A-Cath is stable.

ABDOMEN/PELVIS: No abnormal hypermetabolic activity within the
liver, pancreas, adrenal glands, or spleen. No hypermetabolic lymph
nodes in the abdomen or pelvis.

Incidental CT findings: The gallbladder is surgically absent. No
biliary dilatation.

SKELETON: No lytic or sclerotic hypermetabolic bone lesions to
suggest osseous metastatic disease. Diffuse but symmetric uptake is
noted in the paraspinal musculature in the thoracic region.

Incidental CT findings: none
IMPRESSION: Stable PET-CT. No findings for local recurrent breast cancer,
locoregional adenopathy or distant metastatic disease.

## 2022-03-16 MED ORDER — FLUDEOXYGLUCOSE F - 18 (FDG) INJECTION: 9.5000 | Freq: Once | INTRAVENOUS | Status: DC | PRN

## 2022-03-16 MED ORDER — SODIUM CHLORIDE 0.9 % IV SOLN
INTRAVENOUS | Status: DC
  Filled 2022-03-16: qty 250

## 2022-03-16 MED ORDER — FULVESTRANT 250 MG/5ML IM SOSY
500.0000 mg | PREFILLED_SYRINGE | INTRAMUSCULAR | Status: DC
  Administered 2022-03-16: 500 mg via INTRAMUSCULAR
  Filled 2022-03-16: qty 10

## 2022-03-16 MED ORDER — HEPARIN SOD (PORK) LOCK FLUSH 100 UNIT/ML IV SOLN
500.0000 [IU] | Freq: Once | INTRAVENOUS | Status: AC
  Administered 2022-03-16: 500 [IU] via INTRAVENOUS
  Filled 2022-03-16: qty 5

## 2022-03-16 MED ORDER — ZOLEDRONIC ACID 4 MG/100ML IV SOLN
4.0000 mg | Freq: Once | INTRAVENOUS | Status: AC
  Administered 2022-03-16: 4 mg via INTRAVENOUS
  Filled 2022-03-16: qty 100

## 2022-03-20 ENCOUNTER — Other Ambulatory Visit: Payer: Self-pay | Admitting: Oncology

## 2022-03-29 ENCOUNTER — Other Ambulatory Visit: Payer: Self-pay | Admitting: Oncology

## 2022-03-29 MED ORDER — OXYCODONE HCL 5 MG PO TABS: 5.0000 mg | ORAL_TABLET | Freq: Four times a day (QID) | ORAL | 0 refills | Status: DC | PRN

## 2022-04-08 ENCOUNTER — Other Ambulatory Visit: Payer: Self-pay | Admitting: Oncology

## 2022-04-08 NOTE — Progress Notes (Signed)
Herceptin/Perjeta resuming on 5/1. Last dose 9/30. Paused due to decrease EF from 55-60 to 45. Last EF on March was back to normal 55-60. Due to this, per MD will not reload and continue normal maintenance infusion rate. ?

## 2022-04-11 ENCOUNTER — Inpatient Hospital Stay: Payer: Medicare HMO

## 2022-04-11 ENCOUNTER — Inpatient Hospital Stay: Payer: Medicare HMO | Attending: Oncology

## 2022-04-11 ENCOUNTER — Ambulatory Visit: Payer: Medicare HMO | Admitting: Oncology

## 2022-04-11 ENCOUNTER — Encounter: Payer: Self-pay | Admitting: Oncology

## 2022-04-11 ENCOUNTER — Inpatient Hospital Stay (HOSPITAL_BASED_OUTPATIENT_CLINIC_OR_DEPARTMENT_OTHER): Payer: Medicare HMO | Admitting: Oncology

## 2022-04-11 VITALS — BP 120/75 | HR 110 | Temp 97.3°F | Ht 67.0 in | Wt 185.0 lb

## 2022-04-11 DIAGNOSIS — Z79811 Long term (current) use of aromatase inhibitors: Secondary | ICD-10-CM

## 2022-04-11 DIAGNOSIS — C50411 Malignant neoplasm of upper-outer quadrant of right female breast: Secondary | ICD-10-CM | POA: Diagnosis present

## 2022-04-11 DIAGNOSIS — Z5111 Encounter for antineoplastic chemotherapy: Secondary | ICD-10-CM | POA: Insufficient documentation

## 2022-04-11 DIAGNOSIS — C50919 Malignant neoplasm of unspecified site of unspecified female breast: Secondary | ICD-10-CM

## 2022-04-11 DIAGNOSIS — C7951 Secondary malignant neoplasm of bone: Secondary | ICD-10-CM | POA: Insufficient documentation

## 2022-04-11 DIAGNOSIS — Z79899 Other long term (current) drug therapy: Secondary | ICD-10-CM | POA: Diagnosis not present

## 2022-04-11 DIAGNOSIS — E876 Hypokalemia: Secondary | ICD-10-CM

## 2022-04-11 DIAGNOSIS — C50811 Malignant neoplasm of overlapping sites of right female breast: Secondary | ICD-10-CM

## 2022-04-11 DIAGNOSIS — Z5112 Encounter for antineoplastic immunotherapy: Secondary | ICD-10-CM

## 2022-04-11 LAB — CBC WITH DIFFERENTIAL/PLATELET
Abs Immature Granulocytes: 0.02 10*3/uL (ref 0.00–0.07)
Basophils Absolute: 0 10*3/uL (ref 0.0–0.1)
Basophils Relative: 0 %
Eosinophils Absolute: 0.1 10*3/uL (ref 0.0–0.5)
Eosinophils Relative: 1 %
HCT: 34.4 % — ABNORMAL LOW (ref 36.0–46.0)
Hemoglobin: 11.5 g/dL — ABNORMAL LOW (ref 12.0–15.0)
Immature Granulocytes: 0 %
Lymphocytes Relative: 27 %
Lymphs Abs: 2.2 10*3/uL (ref 0.7–4.0)
MCH: 30 pg (ref 26.0–34.0)
MCHC: 33.4 g/dL (ref 30.0–36.0)
MCV: 89.8 fL (ref 80.0–100.0)
Monocytes Absolute: 0.7 10*3/uL (ref 0.1–1.0)
Monocytes Relative: 9 %
Neutro Abs: 4.9 10*3/uL (ref 1.7–7.7)
Neutrophils Relative %: 63 %
Platelets: 298 10*3/uL (ref 150–400)
RBC: 3.83 MIL/uL — ABNORMAL LOW (ref 3.87–5.11)
RDW: 13.2 % (ref 11.5–15.5)
WBC: 7.9 10*3/uL (ref 4.0–10.5)
nRBC: 0 % (ref 0.0–0.2)

## 2022-04-11 LAB — COMPREHENSIVE METABOLIC PANEL
ALT: 18 U/L (ref 0–44)
AST: 29 U/L (ref 15–41)
Albumin: 4.4 g/dL (ref 3.5–5.0)
Alkaline Phosphatase: 70 U/L (ref 38–126)
Anion gap: 9 (ref 5–15)
BUN: 16 mg/dL (ref 6–20)
CO2: 25 mmol/L (ref 22–32)
Calcium: 9.7 mg/dL (ref 8.9–10.3)
Chloride: 103 mmol/L (ref 98–111)
Creatinine, Ser: 0.84 mg/dL (ref 0.44–1.00)
GFR, Estimated: >60 mL/min (ref >60)
Glucose, Bld: 104 mg/dL — ABNORMAL HIGH (ref 70–99)
Potassium: 3.5 mmol/L (ref 3.5–5.1)
Sodium: 137 mmol/L (ref 135–145)
Total Bilirubin: 0.5 mg/dL (ref 0.3–1.2)
Total Protein: 7.8 g/dL (ref 6.5–8.1)

## 2022-04-11 LAB — MAGNESIUM: Magnesium: 2 mg/dL (ref 1.7–2.4)

## 2022-04-11 MED ORDER — ACETAMINOPHEN 325 MG PO TABS
650.0000 mg | ORAL_TABLET | Freq: Once | ORAL | Status: AC
  Administered 2022-04-11: 650 mg via ORAL
  Filled 2022-04-11: qty 2

## 2022-04-11 MED ORDER — HEPARIN SOD (PORK) LOCK FLUSH 100 UNIT/ML IV SOLN
500.0000 [IU] | Freq: Once | INTRAVENOUS | Status: AC | PRN
  Administered 2022-04-11: 500 [IU]
  Filled 2022-04-11: qty 5

## 2022-04-11 MED ORDER — SODIUM CHLORIDE 0.9 % IV SOLN
Freq: Once | INTRAVENOUS | Status: AC
  Filled 2022-04-11: qty 250

## 2022-04-11 MED ORDER — SODIUM CHLORIDE 0.9% FLUSH
10.0000 mL | INTRAVENOUS | Status: DC | PRN
  Filled 2022-04-11: qty 10

## 2022-04-11 MED ORDER — DIPHENHYDRAMINE HCL 25 MG PO CAPS
50.0000 mg | ORAL_CAPSULE | Freq: Once | ORAL | Status: AC
  Administered 2022-04-11: 50 mg via ORAL
  Filled 2022-04-11: qty 2

## 2022-04-11 MED ORDER — TRASTUZUMAB-ANNS CHEMO 150 MG IV SOLR
6.0000 mg/kg | Freq: Once | INTRAVENOUS | Status: AC
  Administered 2022-04-11: 504 mg via INTRAVENOUS
  Filled 2022-04-11: qty 24

## 2022-04-11 MED ORDER — SODIUM CHLORIDE 0.9 % IV SOLN
420.0000 mg | Freq: Once | INTRAVENOUS | Status: AC
  Administered 2022-04-11: 420 mg via INTRAVENOUS
  Filled 2022-04-11: qty 14

## 2022-04-11 NOTE — Progress Notes (Signed)
? ?Hematology/Oncology Progress note ?[phone] [phone] ?  ? ? ?Patient Care Team: ?Marguerita Merles, MD as PCP - General (Family Medicine) ?Nelva Bush, MD as PCP - Cardiology (Cardiology) ?Rico Junker, RN as Oncology Nurse Navigator ?Earlie Server, MD as Consulting Physician (Oncology) ?Robert Bellow, MD (General Surgery) ?Noreene Filbert, MD as Referring Physician (Radiation Oncology) ?Noreene Filbert, MD as Radiation Oncologist (Radiation Oncology) ?Noreene Filbert, MD as Radiation Oncologist (Radiation Oncology)  ? ?Name of the patient: Theresa Chaney  ?[id number]  ?19-Jul-1981  ? ?Date of visit: 04/11/22 ?REASON FOR VISIT ?Follow up for  treatment of breast cancer ? ?Oncology History ?Cancer TREATMENT ?Neoadjuvant ddAC +one dose of Taxol, due to lack of response, surgery was offered. Case was discussed on breast tumor board. 03/19/2018 S/p right mastectomy and right axillary dissection, immediate breast reconstruction with placement of expanders.  ypT3 ypN2, + lymphovascular invasion,  Grade 3, margin is negative, close. ER 90%, PR 0%, HER2 IHC negative.  ?Also had elective bilateral salpingo-oophorectomy.[name]  ?# 06/11/2018 s/p 11 cycles Taxol adjuvantly. Tolerated well.  ?# She has obtained dental clearance for starting Zometa.  S/p Zometa on 6/3/ 2019 ?# s/p adjuvant right chest wall radiation, finished 10/10/2018 ?#06/03/2019 underwent elective left prophylactic mastectomy and sentinel lymph node biopsy of left axilla. ? ?# 06/03/2019 underwent elective left prophylactic mastectomy and sentinel lymph node biopsy of left axilla. ?Pathology negative for malignancy. ?Status post Mediport removal on 06/03/2019. ?She also underwent right implant removal on 06/03/2019. ?She also underwent right implant removal on 06/03/2019. ?#Negative genetic testing ? ?#  chemotherapy-induced neuropathy, bilateral fingertips and lower extremities.  Patient is on Lyrica and nortriptyline.  Follows up with  neurology. ?She follows up with lymphedema clinic. ? ?#  07/07/2020, MRI thoracic spine without contrast showed lesions involving the T7 posterior elements most concerning for metastatic lesion.  No evidence of epidural tumor.  Minimal thoracic spondylosis without stenosis. ?MRI was reviewed by me and a PET scan was obtained for further evaluation. ?07/20/2020, PET scan showed hypermetabolic metastasis involving the posterior element of T7, no additional evidence of metastasis in the neck, chest, abdomen or pelvis. ? ?# 07/29/2020 T7 lesion biopsy showed metastatic carcinoma, compatible with breast origin.  Receptor status staining showed ER 71-80% positive, PR negative, HER-2 positive IHC 3+ ?# Patient finished spine radiation on 08/31/2020  ? ?positive for COVID-19 on 01/11/2021 ? ?# 03/15/2021, PET scan showed no focal hypermetabolic activity to suggest skeletal metastasis.  Mild hypermetabolic activity along the right T7-8 paraspinal musculature.  Max SUV 3.3.  Likely postprocedural- ? ?#Lower extremity cramps when she eats banana or oral potassium supplementation.not able to tolerate po potassium.  ? ?# 09/28/2021, echocardiogram showed further decrease of LVEF to 45% ?#11/12/21 echocardiogram showed LVEF of 45-50%. ? ?INTERVAL HISTORY ?41 yo female with above oncology history reviewed by me presents for follow-up of management of metastatic breast cancer.  ?02/25/2022, echocardiogram showed LVEF 55-60%.  Improved.  Patient follows up closely with cardiology.  She was cleared by cardiology to resume treatments. ?Intermittent back pain, unchanged.  No other new complaints. ? ? Review of Systems  ?Constitutional:  Negative for chills, fever, malaise/fatigue and weight loss.  ?HENT:  Negative for sore throat.   ?Eyes:  Negative for redness.  ?Respiratory:  Negative for cough, shortness of breath and wheezing.   ?Cardiovascular:  Negative for chest pain, palpitations and leg swelling.  ?Gastrointestinal:  Negative for  abdominal pain, blood in stool, heartburn, nausea and vomiting.  ?Genitourinary:  Negative for dysuria.  ?Musculoskeletal:  Positive for back pain and joint pain. Negative for myalgias.  ?Skin:  Negative for rash.  ?Neurological:  Positive for tingling. Negative for dizziness and tremors.  ?Endo/Heme/Allergies:  Does not bruise/bleed easily.  ?Psychiatric/Behavioral:  Negative for hallucinations.   ? ?No Known Allergies ? ?Patient Active Problem List  ? Diagnosis Date Noted  ? Tachycardia, unspecified 04/15/2021  ? Bone metastasis 03/25/2021  ? Encounter for monoclonal antibody treatment for malignancy 12/10/2020  ? Bone lesion 11/19/2020  ? Hypocalcemia 11/19/2020  ? Inflammatory arthritis 11/19/2020  ? Encounter for antineoplastic chemotherapy 10/29/2020  ? Metastatic breast cancer 08/31/2020  ? Goals of care, counseling/discussion 08/11/2020  ? Neuropathy due to chemotherapeutic drug ([place]) 08/11/2020  ? HER2-positive carcinoma of breast ([place]) 08/11/2020  ? Rheumatoid arteritis ([place]) 10/13/2019  ? Bilateral hand swelling 10/03/2019  ? Fracture of neck of metacarpal bone 05/14/2019  ? Chronic fatigue 04/09/2019  ? Polyarthralgia 04/09/2019  ? Status post right breast reconstruction 02/26/2019  ? Status post right mastectomy 02/26/2019  ? Mastalgia 02/15/2019  ? Shortness of breath 08/23/2018  ? Nonischemic cardiomyopathy ([place]) 08/23/2018  ? Preprocedural cardiovascular examination 08/23/2018  ? Tachycardia 08/23/2018  ? Palpitations 08/23/2018  ? Estrogen receptor positive status (ER+) 04/04/2018  ? Acquired absence of right breast and nipple 04/03/2018  ? Breast cancer of upper-outer quadrant of right female breast ([place]) 03/19/2018  ? Family history of cancer   ? Malignant neoplasm of overlapping sites of right breast in female, estrogen receptor positive ([place]) 10/19/2017  ? Gastroesophageal reflux disease without esophagitis 02/24/2017  ? Generalized anxiety disorder 10/03/2014  ? Headache 10/03/2014  ? ? ? ?Past  Medical History:  ?Diagnosis Date  ? Anemia   ? Arthritis   ? BRCA negative 11/26/2017  ? Breast cancer ([place]) 10/11/2017  ? Multifocal, ER positive, PR negative, HER-2 negative. ypT3 ypN2a 8.7 cm, 4/15 nodes  ? Cardiomyopathy ([place])   ? a. 10/2017 Echo: EF 60-65%, no rwma, Gr1 DD, nl RV size/fxn; b. 04/2018 Echo: EF 55-60%, no rwma, Nl RV size/fxn; c. 08/2018 Echo: EF 45%, diff HK, ? HK of antsept wall. Gr1 DD. Mild MR. Mild LAE/RAE. Mod dil RV.   ? Chronic bronchitis ([place]) 11/2017  ? COPD (chronic obstructive pulmonary disease) ([place])   ? MILD PER CXR  ? Depression   ? Family history of cancer   ? GERD (gastroesophageal reflux disease)   ? Headache   ? MIGRAINES  ? Heart murmur   ? ASYMPTOMATIC  ? Personal history of chemotherapy   ? current for right breast ca  ? ? ? ?Past Surgical History:  ?Procedure Laterality Date  ? AXILLARY LYMPH NODE DISSECTION Right 03/19/2018  ? Procedure: AXILLARY LYMPH NODE DISSECTION;  Surgeon: Robert Bellow, MD;  Location: [facility];  Service: General;  Laterality: Right;  ? BREAST BIOPSY Right 10/11/2017  ? 12:30 posterior coil clip invasive mammary carcinoma  ? BREAST BIOPSY Right 10/11/2017  ? 11:30 middle depth ribbon clip DCIS  ? BREAST BIOPSY Right 10/11/2017  ? 5:30 anterior depth x shape invasive ductal carcinoma  ? BREAST IMPLANT REMOVAL Right 06/03/2019  ? Procedure: REMOVAL OF RIGHT BREAST IMPLANTS;  Surgeon: Wallace Going, DO;  Location: [facility];  Service: Plastics;  Laterality: Right;  ? BREAST RECONSTRUCTION WITH PLACEMENT OF TISSUE EXPANDER AND FLEX HD (ACELLULAR HYDRATED DERMIS) Right 03/19/2018  ? Procedure: BREAST RECONSTRUCTION WITH PLACEMENT OF TISSUE EXPANDER AND FLEX HD (ACELLULAR HYDRATED DERMIS);  Surgeon: Wallace Going,  DO;  Location: [facility];  Service: Plastics;  Laterality: Right;  ? CARPAL TUNNEL RELEASE Bilateral 2020  ? CHOLECYSTECTOMY N/A 04/27/2020  ? Procedure: LAPAROSCOPIC CHOLECYSTECTOMY WITH INTRAOPERATIVE CHOLANGIOGRAM;  Surgeon: Robert Bellow, MD;  Location: [facility];  Service: General;  Laterality: N/A;  ? ESOPHAGOGASTRODUODENOSCOPY (EGD) WITH PROPOFOL N/A 04/17/2020  ? Procedure: ESOPHAGOGASTRODUODENOSCOPY (EGD) WITH PROPOFOL;  Surgeon:

## 2022-04-11 NOTE — Progress Notes (Signed)
HR elevated. MD aware. OK to proceed with treatment. ?

## 2022-04-15 ENCOUNTER — Inpatient Hospital Stay: Payer: Medicare HMO

## 2022-04-15 VITALS — BP 132/87 | HR 104 | Temp 96.7°F | Resp 18 | Ht 67.0 in | Wt 186.1 lb

## 2022-04-15 DIAGNOSIS — Z170 Estrogen receptor positive status [ER+]: Secondary | ICD-10-CM

## 2022-04-15 DIAGNOSIS — C50919 Malignant neoplasm of unspecified site of unspecified female breast: Secondary | ICD-10-CM

## 2022-04-15 DIAGNOSIS — Z5111 Encounter for antineoplastic chemotherapy: Secondary | ICD-10-CM | POA: Diagnosis not present

## 2022-04-15 DIAGNOSIS — Z5112 Encounter for antineoplastic immunotherapy: Secondary | ICD-10-CM

## 2022-04-15 LAB — COMPREHENSIVE METABOLIC PANEL
ALT: 21 U/L (ref 0–44)
AST: 30 U/L (ref 15–41)
Albumin: 4.3 g/dL (ref 3.5–5.0)
Alkaline Phosphatase: 68 U/L (ref 38–126)
Anion gap: 7 (ref 5–15)
BUN: 15 mg/dL (ref 6–20)
CO2: 22 mmol/L (ref 22–32)
Calcium: 9.3 mg/dL (ref 8.9–10.3)
Chloride: 106 mmol/L (ref 98–111)
Creatinine, Ser: 0.65 mg/dL (ref 0.44–1.00)
GFR, Estimated: >60 mL/min (ref >60)
Glucose, Bld: 101 mg/dL — ABNORMAL HIGH (ref 70–99)
Potassium: 3.6 mmol/L (ref 3.5–5.1)
Sodium: 135 mmol/L (ref 135–145)
Total Bilirubin: 0.7 mg/dL (ref 0.3–1.2)
Total Protein: 7.7 g/dL (ref 6.5–8.1)

## 2022-04-15 LAB — CBC WITH DIFFERENTIAL/PLATELET
Abs Immature Granulocytes: 0.02 10*3/uL (ref 0.00–0.07)
Basophils Absolute: 0 10*3/uL (ref 0.0–0.1)
Basophils Relative: 1 %
Eosinophils Absolute: 0.1 10*3/uL (ref 0.0–0.5)
Eosinophils Relative: 2 %
HCT: 33.3 % — ABNORMAL LOW (ref 36.0–46.0)
Hemoglobin: 11.4 g/dL — ABNORMAL LOW (ref 12.0–15.0)
Immature Granulocytes: 0 %
Lymphocytes Relative: 28 %
Lymphs Abs: 1.9 10*3/uL (ref 0.7–4.0)
MCH: 30.4 pg (ref 26.0–34.0)
MCHC: 34.2 g/dL (ref 30.0–36.0)
MCV: 88.8 fL (ref 80.0–100.0)
Monocytes Absolute: 0.5 10*3/uL (ref 0.1–1.0)
Monocytes Relative: 8 %
Neutro Abs: 4.2 10*3/uL (ref 1.7–7.7)
Neutrophils Relative %: 61 %
Platelets: 287 10*3/uL (ref 150–400)
RBC: 3.75 MIL/uL — ABNORMAL LOW (ref 3.87–5.11)
RDW: 13.2 % (ref 11.5–15.5)
WBC: 6.8 10*3/uL (ref 4.0–10.5)
nRBC: 0 % (ref 0.0–0.2)

## 2022-04-15 LAB — SEDIMENTATION RATE: Sed Rate: 34 mm/hr — ABNORMAL HIGH (ref 0–20)

## 2022-04-15 LAB — MAGNESIUM: Magnesium: 2.1 mg/dL (ref 1.7–2.4)

## 2022-04-15 MED ORDER — SODIUM CHLORIDE 0.9 % IV SOLN
INTRAVENOUS | Status: DC
  Filled 2022-04-15: qty 250

## 2022-04-15 MED ORDER — HEPARIN SOD (PORK) LOCK FLUSH 100 UNIT/ML IV SOLN
500.0000 [IU] | Freq: Once | INTRAVENOUS | Status: AC
  Filled 2022-04-15: qty 5

## 2022-04-15 MED ORDER — HEPARIN SOD (PORK) LOCK FLUSH 100 UNIT/ML IV SOLN
INTRAVENOUS | Status: AC
  Administered 2022-04-15: 500 [IU] via INTRAVENOUS
  Filled 2022-04-15: qty 5

## 2022-04-15 MED ORDER — FULVESTRANT 250 MG/5ML IM SOSY
500.0000 mg | PREFILLED_SYRINGE | INTRAMUSCULAR | Status: DC
  Administered 2022-04-15: 500 mg via INTRAMUSCULAR
  Filled 2022-04-15: qty 10

## 2022-04-15 MED ORDER — ZOLEDRONIC ACID 4 MG/100ML IV SOLN
4.0000 mg | Freq: Once | INTRAVENOUS | Status: AC
  Administered 2022-04-15: 4 mg via INTRAVENOUS
  Filled 2022-04-15: qty 100

## 2022-04-15 NOTE — Patient Instructions (Signed)

## 2022-04-20 ENCOUNTER — Other Ambulatory Visit: Payer: Medicare HMO

## 2022-05-06 ENCOUNTER — Telehealth: Payer: Self-pay | Admitting: Oncology

## 2022-05-06 ENCOUNTER — Ambulatory Visit: Payer: Medicare HMO

## 2022-05-06 ENCOUNTER — Ambulatory Visit: Payer: Medicare HMO | Admitting: Oncology

## 2022-05-06 ENCOUNTER — Other Ambulatory Visit: Payer: Medicare HMO

## 2022-05-06 NOTE — Telephone Encounter (Signed)
CompletedCommunicated - Called pt to notify her of updated and adjusted appt information..Pt confirmed appts.Theresa KitchenKj

## 2022-05-13 ENCOUNTER — Encounter: Payer: Self-pay | Admitting: Oncology

## 2022-05-13 ENCOUNTER — Inpatient Hospital Stay: Payer: Medicare HMO | Attending: Oncology

## 2022-05-13 ENCOUNTER — Inpatient Hospital Stay: Payer: Medicare HMO

## 2022-05-13 ENCOUNTER — Other Ambulatory Visit: Payer: Medicare HMO

## 2022-05-13 ENCOUNTER — Ambulatory Visit: Payer: Medicare HMO

## 2022-05-13 ENCOUNTER — Inpatient Hospital Stay (HOSPITAL_BASED_OUTPATIENT_CLINIC_OR_DEPARTMENT_OTHER): Payer: Medicare HMO | Admitting: Oncology

## 2022-05-13 ENCOUNTER — Ambulatory Visit: Payer: Medicare HMO | Admitting: Oncology

## 2022-05-13 VITALS — BP 121/75 | HR 98 | Temp 96.5°F | Wt 182.0 lb

## 2022-05-13 DIAGNOSIS — M899 Disorder of bone, unspecified: Secondary | ICD-10-CM

## 2022-05-13 DIAGNOSIS — C50919 Malignant neoplasm of unspecified site of unspecified female breast: Secondary | ICD-10-CM | POA: Diagnosis not present

## 2022-05-13 DIAGNOSIS — T451X5A Adverse effect of antineoplastic and immunosuppressive drugs, initial encounter: Secondary | ICD-10-CM

## 2022-05-13 DIAGNOSIS — E876 Hypokalemia: Secondary | ICD-10-CM

## 2022-05-13 DIAGNOSIS — G620 Drug-induced polyneuropathy: Secondary | ICD-10-CM

## 2022-05-13 DIAGNOSIS — G8929 Other chronic pain: Secondary | ICD-10-CM

## 2022-05-13 DIAGNOSIS — C7951 Secondary malignant neoplasm of bone: Secondary | ICD-10-CM | POA: Diagnosis present

## 2022-05-13 DIAGNOSIS — Z5111 Encounter for antineoplastic chemotherapy: Secondary | ICD-10-CM | POA: Insufficient documentation

## 2022-05-13 DIAGNOSIS — Z79811 Long term (current) use of aromatase inhibitors: Secondary | ICD-10-CM | POA: Diagnosis not present

## 2022-05-13 DIAGNOSIS — Z170 Estrogen receptor positive status [ER+]: Secondary | ICD-10-CM

## 2022-05-13 DIAGNOSIS — Z95828 Presence of other vascular implants and grafts: Secondary | ICD-10-CM

## 2022-05-13 DIAGNOSIS — M546 Pain in thoracic spine: Secondary | ICD-10-CM

## 2022-05-13 DIAGNOSIS — Z79899 Other long term (current) drug therapy: Secondary | ICD-10-CM | POA: Insufficient documentation

## 2022-05-13 DIAGNOSIS — Z5112 Encounter for antineoplastic immunotherapy: Secondary | ICD-10-CM

## 2022-05-13 DIAGNOSIS — C50411 Malignant neoplasm of upper-outer quadrant of right female breast: Secondary | ICD-10-CM | POA: Insufficient documentation

## 2022-05-13 LAB — CBC WITH DIFFERENTIAL/PLATELET
Abs Immature Granulocytes: 0.02 10*3/uL (ref 0.00–0.07)
Basophils Absolute: 0.1 10*3/uL (ref 0.0–0.1)
Basophils Relative: 1 %
Eosinophils Absolute: 0.1 10*3/uL (ref 0.0–0.5)
Eosinophils Relative: 1 %
HCT: 33 % — ABNORMAL LOW (ref 36.0–46.0)
Hemoglobin: 11.2 g/dL — ABNORMAL LOW (ref 12.0–15.0)
Immature Granulocytes: 0 %
Lymphocytes Relative: 24 %
Lymphs Abs: 2 10*3/uL (ref 0.7–4.0)
MCH: 30 pg (ref 26.0–34.0)
MCHC: 33.9 g/dL (ref 30.0–36.0)
MCV: 88.5 fL (ref 80.0–100.0)
Monocytes Absolute: 0.7 10*3/uL (ref 0.1–1.0)
Monocytes Relative: 9 %
Neutro Abs: 5.5 10*3/uL (ref 1.7–7.7)
Neutrophils Relative %: 65 %
Platelets: 269 10*3/uL (ref 150–400)
RBC: 3.73 MIL/uL — ABNORMAL LOW (ref 3.87–5.11)
RDW: 13.1 % (ref 11.5–15.5)
WBC: 8.4 10*3/uL (ref 4.0–10.5)
nRBC: 0 % (ref 0.0–0.2)

## 2022-05-13 LAB — COMPREHENSIVE METABOLIC PANEL
ALT: 20 U/L (ref 0–44)
AST: 28 U/L (ref 15–41)
Albumin: 4.2 g/dL (ref 3.5–5.0)
Alkaline Phosphatase: 65 U/L (ref 38–126)
Anion gap: 9 (ref 5–15)
BUN: 20 mg/dL (ref 6–20)
CO2: 24 mmol/L (ref 22–32)
Calcium: 9.7 mg/dL (ref 8.9–10.3)
Chloride: 105 mmol/L (ref 98–111)
Creatinine, Ser: 0.86 mg/dL (ref 0.44–1.00)
GFR, Estimated: >60 mL/min (ref >60)
Glucose, Bld: 113 mg/dL — ABNORMAL HIGH (ref 70–99)
Potassium: 3.3 mmol/L — ABNORMAL LOW (ref 3.5–5.1)
Sodium: 138 mmol/L (ref 135–145)
Total Bilirubin: 0.2 mg/dL — ABNORMAL LOW (ref 0.3–1.2)
Total Protein: 7.5 g/dL (ref 6.5–8.1)

## 2022-05-13 LAB — MAGNESIUM: Magnesium: 2 mg/dL (ref 1.7–2.4)

## 2022-05-13 MED ORDER — SODIUM CHLORIDE 0.9% FLUSH
10.0000 mL | INTRAVENOUS | Status: DC | PRN
  Administered 2022-05-13: 10 mL
  Filled 2022-05-13: qty 10

## 2022-05-13 MED ORDER — SODIUM CHLORIDE 0.9 % IV SOLN
Freq: Once | INTRAVENOUS | Status: AC
  Filled 2022-05-13: qty 250

## 2022-05-13 MED ORDER — FULVESTRANT 250 MG/5ML IM SOSY
500.0000 mg | PREFILLED_SYRINGE | INTRAMUSCULAR | Status: DC
  Filled 2022-05-13: qty 10

## 2022-05-13 MED ORDER — ZOLEDRONIC ACID 4 MG/100ML IV SOLN
4.0000 mg | Freq: Once | INTRAVENOUS | Status: AC
  Administered 2022-05-13: 4 mg via INTRAVENOUS
  Filled 2022-05-13: qty 100

## 2022-05-13 MED ORDER — FULVESTRANT 250 MG/5ML IM SOSY
500.0000 mg | PREFILLED_SYRINGE | Freq: Once | INTRAMUSCULAR | Status: AC
  Administered 2022-05-13: 500 mg via INTRAMUSCULAR
  Filled 2022-05-13: qty 10

## 2022-05-13 MED ORDER — ACETAMINOPHEN 325 MG PO TABS
650.0000 mg | ORAL_TABLET | Freq: Once | ORAL | Status: AC
  Administered 2022-05-13: 650 mg via ORAL
  Filled 2022-05-13: qty 2

## 2022-05-13 MED ORDER — HEPARIN SOD (PORK) LOCK FLUSH 100 UNIT/ML IV SOLN
500.0000 [IU] | Freq: Once | INTRAVENOUS | Status: AC | PRN
  Administered 2022-05-13: 500 [IU]
  Filled 2022-05-13: qty 5

## 2022-05-13 MED ORDER — TRASTUZUMAB-ANNS CHEMO 150 MG IV SOLR
6.0000 mg/kg | Freq: Once | INTRAVENOUS | Status: AC
  Administered 2022-05-13: 504 mg via INTRAVENOUS
  Filled 2022-05-13: qty 24

## 2022-05-13 MED ORDER — SODIUM CHLORIDE 0.9 % IV SOLN
420.0000 mg | Freq: Once | INTRAVENOUS | Status: AC
  Administered 2022-05-13: 420 mg via INTRAVENOUS
  Filled 2022-05-13: qty 14

## 2022-05-13 MED ORDER — DIPHENHYDRAMINE HCL 25 MG PO CAPS
50.0000 mg | ORAL_CAPSULE | Freq: Once | ORAL | Status: AC
  Administered 2022-05-13: 50 mg via ORAL
  Filled 2022-05-13: qty 2

## 2022-05-13 NOTE — Patient Instructions (Signed)
[facility] AT [place]  Discharge Instructions: Thank you for choosing [facility] to provide your oncology and hematology care.  If you have a lab appointment with the [facility], please go directly to the [facility] and check in at the registration area.  Wear comfortable clothing and clothing appropriate for easy access to any Portacath or PICC line.   We strive to give you quality time with your provider. You may need to reschedule your appointment if you arrive late (15 or more minutes).  Arriving late affects you and other patients whose appointments are after yours.  Also, if you miss three or more appointments without notifying the office, you may be dismissed from the clinic at the provider's discretion.      For prescription refill requests, have your pharmacy contact our office and allow 72 hours for refills to be completed.    Today you received the following chemotherapy and/or immunotherapy agents Trastuzumab & Pertuzumab      To help prevent nausea and vomiting after your treatment, we encourage you to take your nausea medication as directed.  BELOW ARE SYMPTOMS THAT SHOULD BE REPORTED IMMEDIATELY: *FEVER GREATER THAN 100.4 F (38 C) OR HIGHER *CHILLS OR SWEATING *NAUSEA AND VOMITING THAT IS NOT CONTROLLED WITH YOUR NAUSEA MEDICATION *UNUSUAL SHORTNESS OF BREATH *UNUSUAL BRUISING OR BLEEDING *URINARY PROBLEMS (pain or burning when urinating, or frequent urination) *BOWEL PROBLEMS (unusual diarrhea, constipation, pain near the anus) TENDERNESS IN MOUTH AND THROAT WITH OR WITHOUT PRESENCE OF ULCERS (sore throat, sores in mouth, or a toothache) UNUSUAL RASH, SWELLING OR PAIN  UNUSUAL VAGINAL DISCHARGE OR ITCHING   Items with * indicate a potential emergency and should be followed up as soon as possible or go to the Emergency Department if any problems should occur.  Please show the CHEMOTHERAPY ALERT CARD or IMMUNOTHERAPY ALERT CARD  at check-in to the Emergency Department and triage nurse.  Should you have questions after your visit or need to cancel or reschedule your appointment, please contact [facility] [place] AT [place]  [phone] and follow the prompts.  Office hours are 8:00 a.m. to 4:30 p.m. Monday - Friday. Please note that voicemails left after 4:00 p.m. may not be returned until the following business day.  We are closed weekends and major holidays. You have access to a nurse at all times for urgent questions. Please call the main number to the clinic [phone] and follow the prompts.  For any non-urgent questions, you may also contact your provider using MyChart. We now offer e-Visits for anyone 17 and older to request care online for non-urgent symptoms. For details visit [email or web address].   Also download the MyChart app! Go to the app store, search "MyChart", open the app, select [facility], and log in with your MyChart username and password.  Due to Covid, a mask is required upon entering the hospital/clinic. If you do not have a mask, one will be given to you upon arrival. For doctor visits, patients may have 1 support person aged 49 or older with them. For treatment visits, patients cannot have anyone with them due to current Covid guidelines and our immunocompromised population.

## 2022-05-13 NOTE — Progress Notes (Signed)
Hematology/Oncology Progress note [phone] [phone]     Patient Care Team: Marguerita Merles, MD as PCP - General (Family Medicine) End, Harrell Gave, MD as PCP - Cardiology (Cardiology) Rico Junker, RN as Oncology Nurse Navigator Earlie Server, MD as Consulting Physician (Oncology) Bary Castilla, Forest Gleason, MD (General Surgery) Noreene Filbert, MD as Referring Physician (Radiation Oncology) Noreene Filbert, MD as Radiation Oncologist (Radiation Oncology) Noreene Filbert, MD as Radiation Oncologist (Radiation Oncology)   Name of the patient: Theresa Chaney  [id number]  September 17, 1981   Date of visit: 05/13/22 REASON FOR VISIT Follow up for  treatment of breast cancer  Oncology History Cancer TREATMENT Neoadjuvant ddAC +one dose of Taxol, due to lack of response, surgery was offered. Case was discussed on breast tumor board. 03/19/2018 S/p right mastectomy and right axillary dissection, immediate breast reconstruction with placement of expanders.  ypT3 ypN2, + lymphovascular invasion,  Grade 3, margin is negative, close. ER 90%, PR 0%, HER2 IHC negative.  Also had elective bilateral salpingo-oophorectomy..  # 06/11/2018 s/p 11 cycles Taxol adjuvantly. Tolerated well.  # She has obtained dental clearance for starting Zometa.  S/p Zometa on 6/3/ 2019 # s/p adjuvant right chest wall radiation, finished 10/10/2018 #06/03/2019 underwent elective left prophylactic mastectomy and sentinel lymph node biopsy of left axilla.  # 06/03/2019 underwent elective left prophylactic mastectomy and sentinel lymph node biopsy of left axilla. Pathology negative for malignancy. Status post Mediport removal on 06/03/2019. She also underwent right implant removal on 06/03/2019. She also underwent right implant removal on 06/03/2019. #Negative genetic testing  #  chemotherapy-induced neuropathy, bilateral fingertips and lower extremities.  Patient is on Lyrica and nortriptyline.  Follows up with  neurology. She follows up with lymphedema clinic.  #  07/07/2020, MRI thoracic spine without contrast showed lesions involving the T7 posterior elements most concerning for metastatic lesion.  No evidence of epidural tumor.  Minimal thoracic spondylosis without stenosis. MRI was reviewed by me and a PET scan was obtained for further evaluation. 07/20/2020, PET scan showed hypermetabolic metastasis involving the posterior element of T7, no additional evidence of metastasis in the neck, chest, abdomen or pelvis.  # 07/29/2020 T7 lesion biopsy showed metastatic carcinoma, compatible with breast origin.  Receptor status staining showed ER 71-80% positive, PR negative, HER-2 positive IHC 3+ # Patient finished spine radiation on 08/31/2020   positive for COVID-19 on 01/11/2021  # 03/15/2021, PET scan showed no focal hypermetabolic activity to suggest skeletal metastasis.  Mild hypermetabolic activity along the right T7-8 paraspinal musculature.  Max SUV 3.3.  Likely postprocedural-  #Lower extremity cramps when she eats banana or oral potassium supplementation.not able to tolerate po potassium.   # 09/28/2021, echocardiogram showed further decrease of LVEF to 45% #11/12/21 echocardiogram showed LVEF of 45-50%. # 02/25/2022, echocardiogram showed LVEF 55-60%.  INTERVAL HISTORY 41 yo female with above oncology history reviewed by me presents for follow-up of management of metastatic breast cancer.  Patient reports feeling well.  She has no new complaint Chronic back pain, unchanged.   Patient has 2D echo scheduled by cardiology to monitor heart function    Review of Systems  Constitutional:  Negative for chills, fever, malaise/fatigue and weight loss.  HENT:  Negative for sore throat.   Eyes:  Negative for redness.  Respiratory:  Negative for cough, shortness of breath and wheezing.   Cardiovascular:  Negative for chest pain, palpitations and leg swelling.  Gastrointestinal:  Negative for abdominal  pain, blood in stool, heartburn, nausea and vomiting.  Genitourinary:  Negative for dysuria.  Musculoskeletal:  Positive for back pain and joint pain. Negative for myalgias.  Skin:  Negative for rash.  Neurological:  Positive for tingling. Negative for dizziness and tremors.  Endo/Heme/Allergies:  Does not bruise/bleed easily.  Psychiatric/Behavioral:  Negative for hallucinations.    No Known Allergies  Patient Active Problem List   Diagnosis Date Noted   Tachycardia, unspecified 04/15/2021   Bone metastasis 03/25/2021   Encounter for monoclonal antibody treatment for malignancy 12/10/2020   Bone lesion 11/19/2020   Hypocalcemia 11/19/2020   Inflammatory arthritis 11/19/2020   Encounter for antineoplastic chemotherapy 10/29/2020   Metastatic breast cancer 08/31/2020   Goals of care, counseling/discussion 08/11/2020   Neuropathy due to chemotherapeutic drug ([place]) 08/11/2020   HER2-positive carcinoma of breast ([place]) 08/11/2020   Rheumatoid arteritis ([place]) 10/13/2019   Bilateral hand swelling 10/03/2019   Fracture of neck of metacarpal bone 05/14/2019   Chronic fatigue 04/09/2019   Polyarthralgia 04/09/2019   Status post right breast reconstruction 02/26/2019   Status post right mastectomy 02/26/2019   Mastalgia 02/15/2019   Shortness of breath 08/23/2018   Nonischemic cardiomyopathy ([place]) 08/23/2018   Preprocedural cardiovascular examination 08/23/2018   Tachycardia 08/23/2018   Palpitations 08/23/2018   Estrogen receptor positive status (ER+) 04/04/2018   Acquired absence of right breast and nipple 04/03/2018   Breast cancer of upper-outer quadrant of right female breast ([place]) 03/19/2018   Family history of cancer    Malignant neoplasm of overlapping sites of right breast in female, estrogen receptor positive ([place]) 10/19/2017   Gastroesophageal reflux disease without esophagitis 02/24/2017   Generalized anxiety disorder 10/03/2014   Headache 10/03/2014     Past Medical  History:  Diagnosis Date   Anemia    Arthritis    BRCA negative 11/26/2017   Breast cancer ([place]) 10/11/2017   Multifocal, ER positive, PR negative, HER-2 negative. ypT3 ypN2a 8.7 cm, 4/15 nodes   Cardiomyopathy ([place])    a. 10/2017 Echo: EF 60-65%, no rwma, Gr1 DD, nl RV size/fxn; b. 04/2018 Echo: EF 55-60%, no rwma, Nl RV size/fxn; c. 08/2018 Echo: EF 45%, diff HK, ? HK of antsept wall. Gr1 DD. Mild MR. Mild LAE/RAE. Mod dil RV.    Chronic bronchitis ([place]) 11/2017   COPD (chronic obstructive pulmonary disease) ([facility])    MILD PER CXR   Depression    Family history of cancer    GERD (gastroesophageal reflux disease)    Headache    MIGRAINES   Heart murmur    ASYMPTOMATIC   Personal history of chemotherapy    current for right breast ca     Past Surgical History:  Procedure Laterality Date   AXILLARY LYMPH NODE DISSECTION Right 03/19/2018   Procedure: AXILLARY LYMPH NODE DISSECTION;  Surgeon: Robert Bellow, MD;  Location: [facility];  Service: General;  Laterality: Right;   BREAST BIOPSY Right 10/11/2017   12:30 posterior coil clip invasive mammary carcinoma   BREAST BIOPSY Right 10/11/2017   11:30 middle depth ribbon clip DCIS   BREAST BIOPSY Right 10/11/2017   5:30 anterior depth x shape invasive ductal carcinoma   BREAST IMPLANT REMOVAL Right 06/03/2019   Procedure: REMOVAL OF RIGHT BREAST IMPLANTS;  Surgeon: Wallace Going, DO;  Location: [facility];  Service: Plastics;  Laterality: Right;   BREAST RECONSTRUCTION WITH PLACEMENT OF TISSUE EXPANDER AND FLEX HD (ACELLULAR HYDRATED DERMIS) Right 03/19/2018   Procedure: BREAST RECONSTRUCTION WITH PLACEMENT OF TISSUE EXPANDER AND FLEX HD (ACELLULAR HYDRATED DERMIS);  Surgeon: Marla Roe,  Loel Lofty, DO;  Location: [facility];  Service: Plastics;  Laterality: Right;   CARPAL TUNNEL RELEASE Bilateral 2020   CHOLECYSTECTOMY N/A 04/27/2020   Procedure: LAPAROSCOPIC CHOLECYSTECTOMY WITH INTRAOPERATIVE CHOLANGIOGRAM;  Surgeon: Robert Bellow, MD;  Location: [facility];  Service: General;  Laterality: N/A;   ESOPHAGOGASTRODUODENOSCOPY (EGD) WITH PROPOFOL N/A 04/17/2020   Procedure: ESOPHAGOGASTRODUODENOSCOPY (EGD) WITH PROPOFOL;  Surgeon: Robert Bellow, MD;  Location: [facility];  Service: Endoscopy;  Laterality: N/A;  with biopsy   LAPAROSCOPIC BILATERAL SALPINGO OOPHERECTOMY Bilateral 03/19/2018   Procedure: LAPAROSCOPIC BILATERAL SALPINGO OOPHORECTOMY;  Surgeon: Benjaman Kindler, MD;  Location: [facility];  Service: Gynecology;  Laterality: Bilateral;   MASTECTOMY Right 03/2018   MASTECTOMY W/ SENTINEL NODE BIOPSY Right 03/19/2018   Procedure: MASTECTOMY WITH SENTINEL LYMPH NODE BIOPSY;  Surgeon: Robert Bellow, MD;  Location: [facility];  Service: General;  Laterality: Right;   PORT-A-CATH REMOVAL Left 06/03/2019   Procedure: REMOVAL PORT-A-CATH;  Surgeon: Robert Bellow, MD;  Location: [facility];  Service: General;  Laterality: Left;   PORTACATH PLACEMENT Left 10/24/2017   Procedure: INSERTION PORT-A-CATH;  Surgeon: Robert Bellow, MD;  Location: [facility];  Service: General;  Laterality: Left;   PORTACATH PLACEMENT Right 09/28/2020   Procedure: INSERTION PORT-A-CATH;  Surgeon: Robert Bellow, MD;  Location: [facility];  Service: General;  Laterality: Right;   REMOVAL OF TISSUE EXPANDER AND PLACEMENT OF IMPLANT Right 07/20/2018   Procedure: REMOVAL OF RIGHT BREAST TISSUE EXPANDER AND PLACEMENT OF IMPLANT;  Surgeon: Wallace Going, DO;  Location: [facility];  Service: Plastics;  Laterality: Right;   SIMPLE MASTECTOMY WITH AXILLARY SENTINEL NODE BIOPSY Left 06/03/2019   Procedure: SIMPLE MASTECTOMY LEFT;  Surgeon: Robert Bellow, MD;  Location: [facility];  Service: General;  Laterality: Left;    Social History   Socioeconomic History   Marital status: Married    Spouse name: Not on file   Number of children: Not on file   Years of education: Not on file   Highest education level: Not on file   Occupational History   Occupation: pharmacy tech    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 [employer]   Tobacco Use   Smoking status: Every Day    Packs/day: 0.50    Years: 18.00    Pack years: 9.00    Types: Cigarettes    Start date: 06/21/2018   Smokeless tobacco: Never  Vaping Use   Vaping Use: Never used  Substance and Sexual Activity   Alcohol use: No   Drug use: No   Sexual activity: Yes    Birth control/protection: Injection, Other-see comments    Comment: has had hysterectomy  Other Topics Concern   Not on file  Social History Narrative   Lives at home with husband and daughter   Social Determinants of Health   Financial Resource Strain: Not on file  Food Insecurity: Not on file  Transportation Needs: Not on file  Physical Activity: Not on file  Stress: Not on file  Social Connections: Not on file  Intimate Partner Violence: Not on file     Family History  Problem Relation Age of Onset   Diabetes Father    Hypertension Father    Hyperlipidemia Father    Heart attack Father 55       "mild"   Melanoma Maternal Aunt        other aunts with BCC/SCC/Melanoma   Cervical cancer Maternal Aunt 64  daughter w/ cervical cancer as well   Lung cancer Maternal Aunt    Melanoma Maternal Uncle        other uncles with BCC/SCC/Melanoma   Breast cancer Paternal Aunt    Bladder Cancer Maternal Grandmother   Biological mother had Grave's disease.    Current Outpatient Medications:    acetaminophen (TYLENOL) 500 MG tablet, Take 500 mg by mouth every 6 (six) hours as needed., Disp: , Rfl:    albuterol (VENTOLIN HFA) 108 (90 Base) MCG/ACT inhaler, Inhale 2 puffs into the lungs every 6 (six) hours as needed for wheezing or shortness of breath. , Disp: , Rfl:    CALCIUM CARBONATE-VITAMIN D PO, Take 600-800 mg by mouth in the morning, at noon, and at bedtime., Disp: , Rfl:    cyanocobalamin (,VITAMIN B-12,) 1000 MCG/ML injection, Inject 1,000 mcg into the skin every 30  (thirty) days., Disp: , Rfl:    cyclobenzaprine (FLEXERIL) 5 MG tablet, Take 5 mg by mouth 3 (three) times daily as needed for muscle spasms., Disp: , Rfl:    diphenoxylate-atropine (LOMOTIL) 2.5-0.025 MG tablet, Take 1 tablet by mouth 4 (four) times daily as needed for diarrhea or loose stools., Disp: 60 tablet, Rfl: 0   escitalopram (LEXAPRO) 20 MG tablet, Take 20 mg by mouth daily., Disp: , Rfl:    esomeprazole (NEXIUM) 40 MG capsule, Take 40 mg by mouth daily before breakfast. , Disp: , Rfl:    Eszopiclone 3 MG TABS, Take 3 mg by mouth at bedtime as needed (sleep). , Disp: , Rfl:    folic acid (FOLVITE) 1 MG tablet, Take 1 mg by mouth daily., Disp: , Rfl:    hydroxychloroquine (PLAQUENIL) 200 MG tablet, Take 200 mg by mouth daily., Disp: , Rfl:    ibuprofen (ADVIL) 800 MG tablet, Take 800 mg by mouth every 8 (eight) hours as needed for moderate pain., Disp: , Rfl:    loperamide (IMODIUM) 2 MG capsule, Take 2 tablets with onset of diarrhea, then take 1 tablet every 2 hours until diarrhea stops. Maximum 8 tablets in 24hours, Disp: 30 capsule, Rfl: 3   loratadine (CLARITIN) 10 MG tablet, Take 10 mg by mouth daily. , Disp: , Rfl:    LORazepam (ATIVAN) 1 MG tablet, Take 1 mg by mouth 3 (three) times daily., Disp: , Rfl:    losartan (COZAAR) 25 MG tablet, Take 0.5 tablets (12.5 mg total) by mouth daily., Disp: 45 tablet, Rfl: 3   Magnesium 500 MG TABS, Take 500 mg by mouth 2 (two) times daily., Disp: , Rfl:    methotrexate (RHEUMATREX) 2.5 MG tablet, Take 25 mg by mouth every Sunday. 10 tablets once a week, Disp: , Rfl:    metoprolol succinate (TOPROL XL) 25 MG 24 hr tablet, Take 0.5 tablets (12.5 mg total) by mouth daily., Disp: 45 tablet, Rfl: 3   mupirocin ointment (BACTROBAN) 2 %, Place 1 application into the nose 2 (two) times daily. Use in each nostril twice daily for five (5) days., Disp: 22 g, Rfl: 5   nortriptyline (PAMELOR) 10 MG capsule, Take 30 mg by mouth at bedtime. , Disp: , Rfl:     oxyCODONE (OXY IR/ROXICODONE) 5 MG immediate release tablet, Take 1 tablet (5 mg total) by mouth every 6 (six) hours as needed for moderate pain or severe pain., Disp: 90 tablet, Rfl: 0   phentermine (ADIPEX-P) 37.5 MG tablet, Take 37.5 mg by mouth daily before breakfast. , Disp: , Rfl:    pregabalin (LYRICA) 150  MG capsule, Take 150 mg by mouth 2 (two) times daily., Disp: , Rfl:    promethazine (PHENERGAN) 25 MG tablet, Take 1 tablet (25 mg total) by mouth every 8 (eight) hours as needed for nausea or vomiting., Disp: 90 tablet, Rfl: 0   pyridOXINE (VITAMIN B-6) 100 MG tablet, Take 100 mg by mouth daily., Disp: , Rfl:    topiramate (TOPAMAX) 50 MG tablet, Take 50 mg by mouth 2 (two) times daily. , Disp: , Rfl:    vitamin C (ASCORBIC ACID) 500 MG tablet, Take 500 mg by mouth daily., Disp: , Rfl:    lidocaine-prilocaine (EMLA) cream, Apply to port and cover 1-2 hours prior to appointment (Patient not taking: Reported on 03/04/2022), Disp: 30 g, Rfl: 3   PARoxetine (PAXIL-CR) 12.5 MG 24 hr tablet, Take 1 tablet by mouth daily. (Patient not taking: Reported on 03/04/2022), Disp: , Rfl:  No current facility-administered medications for this visit.  Facility-Administered Medications Ordered in Other Visits:    heparin lock flush 100 unit/mL, 500 Units, Intravenous, Once, Earlie Server, MD   sodium chloride flush (NS) 0.9 % injection 10 mL, 10 mL, Intravenous, PRN, Earlie Server, MD, 10 mL at 11/19/18 1249   sodium chloride flush (NS) 0.9 % injection 10 mL, 10 mL, Intravenous, Once, Earlie Server, MD   Physical exam:  Vitals:   05/13/22 0841  BP: 121/75  Pulse: 98  Temp: (!) 96.5 F (35.8 C)  TempSrc: Tympanic  Weight: 182 lb (82.6 kg)  ECOG 1 Physical Exam Constitutional:      General: She is not in acute distress.    Appearance: She is not diaphoretic.  HENT:     Head: Normocephalic and atraumatic.     Nose: Nose normal.     Mouth/Throat:     Pharynx: No oropharyngeal exudate.  Eyes:     General: No  scleral icterus.    Pupils: Pupils are equal, round, and reactive to light.  Neck:     Vascular: No JVD.  Cardiovascular:     Rate and Rhythm: Normal rate and regular rhythm.     Heart sounds: Normal heart sounds. No murmur heard. Pulmonary:     Effort: Pulmonary effort is normal. No respiratory distress.     Breath sounds: Normal breath sounds. No wheezing or rales.  Chest:     Chest wall: No tenderness.  Abdominal:     General: Bowel sounds are normal. There is no distension.     Palpations: Abdomen is soft. There is no mass.     Tenderness: There is no abdominal tenderness. There is no rebound.  Musculoskeletal:        General: Normal range of motion.     Cervical back: Normal range of motion and neck supple.  Lymphadenopathy:     Cervical: No cervical adenopathy.  Skin:    General: Skin is warm and dry.     Findings: No erythema or rash.  Neurological:     Mental Status: She is alert and oriented to person, place, and time.     Cranial Nerves: No cranial nerve deficit.     Motor: No abnormal muscle tone.     Coordination: Coordination normal.  Psychiatric:        Mood and Affect: Affect normal.       Labs     Latest Ref Rng & Units 05/13/2022    8:29 AM  CMP  Glucose 70 - 99 mg/dL 113    BUN 6 - 20  mg/dL 20    Creatinine 0.44 - 1.00 mg/dL 0.86    Sodium 135 - 145 mmol/L 138    Potassium 3.5 - 5.1 mmol/L 3.3    Chloride 98 - 111 mmol/L 105    CO2 22 - 32 mmol/L 24    Calcium 8.9 - 10.3 mg/dL 9.7    Total Protein 6.5 - 8.1 g/dL 7.5    Total Bilirubin 0.3 - 1.2 mg/dL 0.2    Alkaline Phos 38 - 126 U/L 65    AST 15 - 41 U/L 28    ALT 0 - 44 U/L 20        Latest Ref Rng & Units 05/13/2022    8:29 AM  CBC  WBC 4.0 - 10.5 K/uL 8.4    Hemoglobin 12.0 - 15.0 g/dL 11.2    Hematocrit 36.0 - 46.0 % 33.0    Platelets 150 - 400 K/uL 269     RADIOGRAPHIC STUDIES: I have personally reviewed the radiological images as listed and agreed with the findings in the  report. MR Thoracic Spine W Wo Contrast  Result Date: 02/18/2022 CLINICAL DATA:  Initial evaluation for mid lower back pain. History of metastatic breast cancer. EXAM: MRI THORACIC AND LUMBAR SPINE WITHOUT AND WITH CONTRAST TECHNIQUE: Multiplanar and multiecho pulse sequences of the thoracic and lumbar spine were obtained without and with intravenous contrast. CONTRAST:  7.48m GADAVIST GADOBUTROL 1 MMOL/ML IV SOLN COMPARISON:  Prior MRI from 07/20/2021. FINDINGS: MRI THORACIC SPINE FINDINGS Alignment: Physiologic with preservation of the normal thoracic kyphosis. No listhesis. Vertebrae: Vertebral body height maintained without acute or chronic fracture. Bone marrow signal intensity within normal limits. No discrete or worrisome osseous lesions or evidence for metastatic disease. No evidence for locally recurrent disease about the posterior elements of T7. No abnormal marrow edema or enhancement. Cord: Normal signal and morphology. No abnormal enhancement or epidural tumor. Paraspinal and other soft tissues: Unremarkable. Disc levels: No significant disc pathology seen within the thoracic spine. No disc bulge or focal disc herniation. Residual posterior element hypertrophy present at T6-7, stable from prior. No canal or foraminal stenosis or evidence for neural impingement. MRI LUMBAR SPINE FINDINGS Segmentation: Standard. Lowest well-formed disc space labeled the L5-S1 level. Alignment: Physiologic with preservation of the normal lumbar lordosis. No listhesis. Vertebrae: Vertebral body height maintained without acute or chronic fracture. Bone marrow signal intensity within normal limits. No discrete or worrisome osseous lesions or evidence for metastatic disease. No abnormal marrow edema or enhancement. Conus medullaris: Extends to the T12-L1 level and appears normal. No abnormal enhancement. Paraspinal and other soft tissues: Unremarkable. Disc levels: No significant disc pathology seen within the lumbar spine.  No focal disc herniation. No significant facet pathology. No significant canal or lateral recess stenosis. Neural foramina remain patent. No neural impingement. IMPRESSION: 1. Negative MRIs of the thoracic and lumbar spine. No evidence for locally recurrent metastasis at the level of T7. No other new metastatic disease elsewhere with thoracolumbar spine. 2. No significant disc pathology, stenosis, or evidence for neural impingement. Electronically Signed   By: BJeannine BogaM.D.   On: 02/18/2022 19:44   MR Lumbar Spine W Wo Contrast  Result Date: 02/18/2022 CLINICAL DATA:  Initial evaluation for mid lower back pain. History of metastatic breast cancer. EXAM: MRI THORACIC AND LUMBAR SPINE WITHOUT AND WITH CONTRAST TECHNIQUE: Multiplanar and multiecho pulse sequences of the thoracic and lumbar spine were obtained without and with intravenous contrast. CONTRAST:  7.557mGADAVIST GADOBUTROL 1 MMOL/ML IV SOLN  COMPARISON:  Prior MRI from 07/20/2021. FINDINGS: MRI THORACIC SPINE FINDINGS Alignment: Physiologic with preservation of the normal thoracic kyphosis. No listhesis. Vertebrae: Vertebral body height maintained without acute or chronic fracture. Bone marrow signal intensity within normal limits. No discrete or worrisome osseous lesions or evidence for metastatic disease. No evidence for locally recurrent disease about the posterior elements of T7. No abnormal marrow edema or enhancement. Cord: Normal signal and morphology. No abnormal enhancement or epidural tumor. Paraspinal and other soft tissues: Unremarkable. Disc levels: No significant disc pathology seen within the thoracic spine. No disc bulge or focal disc herniation. Residual posterior element hypertrophy present at T6-7, stable from prior. No canal or foraminal stenosis or evidence for neural impingement. MRI LUMBAR SPINE FINDINGS Segmentation: Standard. Lowest well-formed disc space labeled the L5-S1 level. Alignment: Physiologic with  preservation of the normal lumbar lordosis. No listhesis. Vertebrae: Vertebral body height maintained without acute or chronic fracture. Bone marrow signal intensity within normal limits. No discrete or worrisome osseous lesions or evidence for metastatic disease. No abnormal marrow edema or enhancement. Conus medullaris: Extends to the T12-L1 level and appears normal. No abnormal enhancement. Paraspinal and other soft tissues: Unremarkable. Disc levels: No significant disc pathology seen within the lumbar spine. No focal disc herniation. No significant facet pathology. No significant canal or lateral recess stenosis. Neural foramina remain patent. No neural impingement. IMPRESSION: 1. Negative MRIs of the thoracic and lumbar spine. No evidence for locally recurrent metastasis at the level of T7. No other new metastatic disease elsewhere with thoracolumbar spine. 2. No significant disc pathology, stenosis, or evidence for neural impingement. Electronically Signed   By: Jeannine Boga M.D.   On: 02/18/2022 19:44   NM PET Image Restag (PS) Skull Base To Thigh  Result Date: 03/16/2022 CLINICAL DATA:  Subsequent treatment strategy for breast cancer. EXAM: NUCLEAR MEDICINE PET SKULL BASE TO THIGH TECHNIQUE: 7.3 mCi F-18 FDG was injected intravenously. Full-ring PET imaging was performed from the skull base to thigh after the radiotracer. CT data was obtained and used for attenuation correction and anatomic localization. Fasting blood glucose: 99 mg/dl COMPARISON:  Multiple previous PET CTs. The most recent is 10/14/2021 FINDINGS: Mediastinal blood pool activity: SUV max 2.75 Liver activity: SUV max NA NECK: No hypermetabolic lymph nodes in the neck. Incidental CT findings: none CHEST: No hypermetabolic mediastinal or hilar nodes. No suspicious pulmonary nodules on the CT scan. Bilateral mastectomies are noted. No hypermetabolic chest wall lesions to suggest recurrent breast cancer. No enlarged or hypermetabolic  supraclavicular or axillary nodes. Incidental CT findings: The right-sided Port-A-Cath is stable. ABDOMEN/PELVIS: No abnormal hypermetabolic activity within the liver, pancreas, adrenal glands, or spleen. No hypermetabolic lymph nodes in the abdomen or pelvis. Incidental CT findings: The gallbladder is surgically absent. No biliary dilatation. SKELETON: No lytic or sclerotic hypermetabolic bone lesions to suggest osseous metastatic disease. Diffuse but symmetric uptake is noted in the paraspinal musculature in the thoracic region. Incidental CT findings: none IMPRESSION: Stable PET-CT. No findings for local recurrent breast cancer, locoregional adenopathy or distant metastatic disease. Electronically Signed   By: Marijo Sanes M.D.   On: 03/16/2022 14:40   ECHOCARDIOGRAM COMPLETE  Result Date: 02/25/2022    ECHOCARDIOGRAM REPORT   Patient Name:   XIAO GRAUL Date of Exam: 02/25/2022 Medical Rec #:  [id number]      Height:       67.0 in Accession #:    [id number]     Weight:       186.1  lb Date of Birth:  12/02/1981      BSA:          1.961 m Patient Age:    41 years       BP:           110/72 mmHg Patient Gender: F              HR:           96 bpm. Exam Location:  [place] Procedure: 2D Echo, Cardiac Doppler, Color Doppler and Intracardiac            Opacification Agent Indications:    I42.80 Non-ischemic cardiomyopathy; R53.83 Fatigue; R00.0                 Tachycardia  History:        Patient has prior history of Echocardiogram examinations, most                 recent 11/11/2021. Cardiomyopathy, COPD, Arrythmias:Tachycardia,                 Signs/Symptoms:Fatigue and Shortness of Breath; Risk                 Factors:Current Smoker. H/o chemotherapy and metastatic breast                 cancer.  Sonographer:    Pilar Jarvis RDMS, RVT, RDCS Referring Phys: [id number] [place]  1. Left ventricular ejection fraction, by estimation, is 55 to 60%. The left ventricle has normal function. The left  ventricle has no regional wall motion abnormalities. Left ventricular diastolic parameters are consistent with Grade I diastolic dysfunction (impaired relaxation).  2. Right ventricular systolic function is normal. The right ventricular size is normal.  3. The mitral valve is normal in structure. No evidence of mitral valve regurgitation. No evidence of mitral stenosis.  4. The aortic valve is normal in structure. Aortic valve regurgitation is not visualized. No aortic stenosis is present.  5. The inferior vena cava is normal in size with greater than 50% respiratory variability, suggesting right atrial pressure of 3 mmHg. Comparison(s): Previous limited echo reported LVEF of 45-50%. FINDINGS  Left Ventricle: Left ventricular ejection fraction, by estimation, is 55 to 60%. The left ventricle has normal function. The left ventricle has no regional wall motion abnormalities. Definity contrast agent was given IV to delineate the left ventricular  endocardial borders. The left ventricular internal cavity size was normal in size. There is no left ventricular hypertrophy. Left ventricular diastolic parameters are consistent with Grade I diastolic dysfunction (impaired relaxation). Right Ventricle: The right ventricular size is normal. No increase in right ventricular wall thickness. Right ventricular systolic function is normal. Left Atrium: Left atrial size was normal in size. Right Atrium: Right atrial size was normal in size. Pericardium: There is no evidence of pericardial effusion. Mitral Valve: The mitral valve is normal in structure. No evidence of mitral valve regurgitation. No evidence of mitral valve stenosis. Tricuspid Valve: The tricuspid valve is normal in structure. Tricuspid valve regurgitation is not demonstrated. No evidence of tricuspid stenosis. Aortic Valve: The aortic valve is normal in structure. Aortic valve regurgitation is not visualized. No aortic stenosis is present. Aortic valve mean gradient  measures 3.0 mmHg. Aortic valve peak gradient measures 5.5 mmHg. Aortic valve area, by VTI measures 3.36 cm. Pulmonic Valve: The pulmonic valve was normal in structure. Pulmonic valve regurgitation is not visualized. No evidence of pulmonic stenosis. Aorta: The aortic  root is normal in size and structure. Venous: The inferior vena cava is normal in size with greater than 50% respiratory variability, suggesting right atrial pressure of 3 mmHg. IAS/Shunts: No atrial level shunt detected by color flow Doppler.  LEFT VENTRICLE PLAX 2D LVIDd:         5.00 cm     Diastology LVIDs:         3.90 cm     LV e' medial:    8.92 cm/s LV PW:         0.70 cm     LV E/e' medial:  6.5 LV IVS:        0.60 cm     LV e' lateral:   10.70 cm/s LVOT diam:     2.10 cm     LV E/e' lateral: 5.4 LV SV:         67 LV SV Index:   34 LVOT Area:     3.46 cm  LV Volumes (MOD) LV vol d, MOD A4C: 80.5 ml LV vol s, MOD A4C: 37.6 ml LV SV MOD A4C:     80.5 ml RIGHT VENTRICLE RV Basal diam:  3.10 cm RV S prime:     10.60 cm/s TAPSE (M-mode): 2.2 cm LEFT ATRIUM             Index        RIGHT ATRIUM           Index LA diam:        3.30 cm 1.68 cm/m   RA Area:     11.80 cm LA Vol (A2C):   37.2 ml 18.97 ml/m  RA Volume:   28.20 ml  14.38 ml/m LA Vol (A4C):   29.7 ml 15.14 ml/m LA Biplane Vol: 33.3 ml 16.98 ml/m  AORTIC VALVE                    PULMONIC VALVE AV Area (Vmax):    3.02 cm     PV Vmax:       0.70 m/s AV Area (Vmean):   3.26 cm     PV Peak grad:  2.0 mmHg AV Area (VTI):     3.36 cm AV Vmax:           117.00 cm/s AV Vmean:          77.400 cm/s AV VTI:            0.198 m AV Peak Grad:      5.5 mmHg AV Mean Grad:      3.0 mmHg LVOT Vmax:         102.00 cm/s LVOT Vmean:        72.900 cm/s LVOT VTI:          0.192 m LVOT/AV VTI ratio: 0.97  AORTA Ao Root diam: 2.70 cm Ao Asc diam:  2.70 cm Ao Arch diam: 2.4 cm MITRAL VALVE MV Area (PHT): 3.50 cm    SHUNTS MV Decel Time: 217 msec    Systemic VTI:  0.19 m MV E velocity: 57.80 cm/s  Systemic  Diam: 2.10 cm MV A velocity: 77.10 cm/s MV E/A ratio:  0.75 Ida Rogue MD Electronically signed by Ida Rogue MD Signature Date/Time: 02/25/2022/6:14:42 PM    Final       Assessment and plan-   Cancer Staging  Breast cancer of upper-outer quadrant of right female breast [facility]) Staging form: Breast, AJCC 8th Edition - Clinical: G2, ER+, PR+, HER2- -  Signed by Earlie Server, MD on 04/05/2018 Histologic grading system: 3 grade system - Pathologic stage from 04/04/2018: No Stage Recommended (ypT3, pN2, cM0, G3, ER+, PR-, HER2-) - Signed by Earlie Server, MD on 04/04/2018 Stage prefix: Post-therapy Neoadjuvant therapy: Yes Response to neoadjuvant therapy: No response Method of lymph node assessment: Axillary lymph node dissection Multigene prognostic tests performed: None Histologic grading system: 3 grade system Laterality: Right - Pathologic: Stage IV (rpTX, pNX, cM1, ER+, PR-, HER2+) - Signed by Earlie Server, MD on 12/10/2020 Stage prefix: Recurrence    1. Primary malignant neoplasm of breast with metastasis ([place])   2. Encounter for monoclonal antibody treatment for malignancy   3. HER2-positive carcinoma of breast ([place])   4. Aromatase inhibitor use   5. Port-A-Cath in place   6. Hypokalemia   7. Bone lesion   8. Neuropathy due to chemotherapeutic drug ([place])   9. Chronic midline thoracic back pain    Recurrent breast cancer with isolated bone metastasis, stage IV #Initially stage IIIA right breast cancer, ER positive, HER-2 negative status post bilateral oophorectomy,Right mastectomy and right axillary lymph node dissection, status post implant, and implant removal.  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 Estrogen positive, HER-2 positive breast cancer.- s/p Palliative radiation to T7  Labs reviewed and discussed Proceed with Kanjinti Perjecta.   Continue fulvestrant monthly.  #Chemotherapy-induced  cardiomyopathy, Continue follow-up with cardiology # Hypokalemia, does not tolerate oral potassium.  Potassium mildly decreased, stable. # Bone metastasis, status post radiation. Continue Zometa monthly.  Proceed with Zometa today.  #Back pain   Continue oxycodone as needed.  Recent PET scan showed no evidence of local recurrence, distant metastasis disease.  #Chemotherapy-induced neuropathy, continue Lyrica and nortriptyline symptoms stable. #Depression.  Continue Lexapro 20 mg daily. #Rheumatoid arthritis,   Follow up with  rheumatology.  Patient is on methotrexate, recently added plaquenil.  # thyroid nodules -  thyroid US showed left lobe nodules are small no need for follow-up.  Right lobe nodule 1.1 cm, repeat thyroid ultrasound in Aug 2023.   Follow-up lab MD [place] 3 weeks.  fulvestrant/Zometa Q 4 weeks    Earlie Server, MD, PhD 05/13/22

## 2022-05-13 NOTE — Progress Notes (Signed)
Patient declined 30 minute post Perjeta observation.

## 2022-05-17 ENCOUNTER — Ambulatory Visit (INDEPENDENT_AMBULATORY_CARE_PROVIDER_SITE_OTHER): Payer: Medicare HMO

## 2022-05-17 DIAGNOSIS — T451X5D Adverse effect of antineoplastic and immunosuppressive drugs, subsequent encounter: Secondary | ICD-10-CM

## 2022-05-17 DIAGNOSIS — T451X5A Adverse effect of antineoplastic and immunosuppressive drugs, initial encounter: Secondary | ICD-10-CM

## 2022-05-17 DIAGNOSIS — I427 Cardiomyopathy due to drug and external agent: Secondary | ICD-10-CM | POA: Diagnosis not present

## 2022-05-17 LAB — ECHOCARDIOGRAM LIMITED
Calc EF: 50.1 %
S' Lateral: 3.3 cm
Single Plane A2C EF: 45.6 %
Single Plane A4C EF: 53.2 %

## 2022-05-23 ENCOUNTER — Other Ambulatory Visit: Payer: Self-pay | Admitting: *Deleted

## 2022-05-23 DIAGNOSIS — I427 Cardiomyopathy due to drug and external agent: Secondary | ICD-10-CM

## 2022-05-26 ENCOUNTER — Other Ambulatory Visit (INDEPENDENT_AMBULATORY_CARE_PROVIDER_SITE_OTHER): Payer: Self-pay | Admitting: Oncology

## 2022-05-26 ENCOUNTER — Encounter: Payer: Self-pay | Admitting: Oncology

## 2022-05-27 ENCOUNTER — Other Ambulatory Visit: Payer: Self-pay

## 2022-05-27 MED ORDER — CHLORHEXIDINE GLUCONATE 0.12 % MT SOLN: 15.0000 mL | Freq: Two times a day (BID) | OROMUCOSAL | 0 refills | Status: AC

## 2022-06-03 ENCOUNTER — Inpatient Hospital Stay: Payer: Medicare HMO

## 2022-06-03 ENCOUNTER — Encounter: Payer: Self-pay | Admitting: Oncology

## 2022-06-03 ENCOUNTER — Inpatient Hospital Stay (HOSPITAL_BASED_OUTPATIENT_CLINIC_OR_DEPARTMENT_OTHER): Payer: Medicare HMO | Admitting: Oncology

## 2022-06-03 VITALS — HR 115

## 2022-06-03 DIAGNOSIS — Z170 Estrogen receptor positive status [ER+]: Secondary | ICD-10-CM

## 2022-06-03 DIAGNOSIS — Z5112 Encounter for antineoplastic immunotherapy: Secondary | ICD-10-CM

## 2022-06-03 DIAGNOSIS — E876 Hypokalemia: Secondary | ICD-10-CM | POA: Insufficient documentation

## 2022-06-03 DIAGNOSIS — C50919 Malignant neoplasm of unspecified site of unspecified female breast: Secondary | ICD-10-CM

## 2022-06-03 DIAGNOSIS — T451X5A Adverse effect of antineoplastic and immunosuppressive drugs, initial encounter: Secondary | ICD-10-CM

## 2022-06-03 DIAGNOSIS — Z5111 Encounter for antineoplastic chemotherapy: Secondary | ICD-10-CM | POA: Diagnosis not present

## 2022-06-03 DIAGNOSIS — C50411 Malignant neoplasm of upper-outer quadrant of right female breast: Secondary | ICD-10-CM | POA: Diagnosis not present

## 2022-06-03 DIAGNOSIS — I428 Other cardiomyopathies: Secondary | ICD-10-CM

## 2022-06-03 DIAGNOSIS — G620 Drug-induced polyneuropathy: Secondary | ICD-10-CM | POA: Diagnosis not present

## 2022-06-03 DIAGNOSIS — C50811 Malignant neoplasm of overlapping sites of right female breast: Secondary | ICD-10-CM

## 2022-06-03 DIAGNOSIS — M0520 Rheumatoid vasculitis with rheumatoid arthritis of unspecified site: Secondary | ICD-10-CM

## 2022-06-03 DIAGNOSIS — E041 Nontoxic single thyroid nodule: Secondary | ICD-10-CM

## 2022-06-03 LAB — COMPREHENSIVE METABOLIC PANEL
ALT: 36 U/L (ref 0–44)
AST: 39 U/L (ref 15–41)
Albumin: 4.5 g/dL (ref 3.5–5.0)
Alkaline Phosphatase: 72 U/L (ref 38–126)
Anion gap: 11 (ref 5–15)
BUN: 19 mg/dL (ref 6–20)
CO2: 21 mmol/L — ABNORMAL LOW (ref 22–32)
Calcium: 9.2 mg/dL (ref 8.9–10.3)
Chloride: 106 mmol/L (ref 98–111)
Creatinine, Ser: 0.98 mg/dL (ref 0.44–1.00)
GFR, Estimated: >60 mL/min (ref >60)
Glucose, Bld: 154 mg/dL — ABNORMAL HIGH (ref 70–99)
Potassium: 3.5 mmol/L (ref 3.5–5.1)
Sodium: 138 mmol/L (ref 135–145)
Total Bilirubin: 0.5 mg/dL (ref 0.3–1.2)
Total Protein: 8.2 g/dL — ABNORMAL HIGH (ref 6.5–8.1)

## 2022-06-03 LAB — CBC WITH DIFFERENTIAL/PLATELET
Abs Immature Granulocytes: 0.03 10*3/uL (ref 0.00–0.07)
Basophils Absolute: 0 10*3/uL (ref 0.0–0.1)
Basophils Relative: 0 %
Eosinophils Absolute: 0.1 10*3/uL (ref 0.0–0.5)
Eosinophils Relative: 1 %
HCT: 37.9 % (ref 36.0–46.0)
Hemoglobin: 12.8 g/dL (ref 12.0–15.0)
Immature Granulocytes: 0 %
Lymphocytes Relative: 31 %
Lymphs Abs: 2.2 10*3/uL (ref 0.7–4.0)
MCH: 29.9 pg (ref 26.0–34.0)
MCHC: 33.8 g/dL (ref 30.0–36.0)
MCV: 88.6 fL (ref 80.0–100.0)
Monocytes Absolute: 0.6 10*3/uL (ref 0.1–1.0)
Monocytes Relative: 8 %
Neutro Abs: 4.2 10*3/uL (ref 1.7–7.7)
Neutrophils Relative %: 60 %
Platelets: 303 10*3/uL (ref 150–400)
RBC: 4.28 MIL/uL (ref 3.87–5.11)
RDW: 13.2 % (ref 11.5–15.5)
WBC: 7.2 10*3/uL (ref 4.0–10.5)
nRBC: 0 % (ref 0.0–0.2)

## 2022-06-03 LAB — MAGNESIUM: Magnesium: 2 mg/dL (ref 1.7–2.4)

## 2022-06-03 MED ORDER — SODIUM CHLORIDE 0.9 % IV SOLN
420.0000 mg | Freq: Once | INTRAVENOUS | Status: AC
  Administered 2022-06-03: 420 mg via INTRAVENOUS
  Filled 2022-06-03: qty 14

## 2022-06-03 MED ORDER — TRASTUZUMAB-ANNS CHEMO 150 MG IV SOLR
6.0000 mg/kg | Freq: Once | INTRAVENOUS | Status: AC
  Administered 2022-06-03: 504 mg via INTRAVENOUS
  Filled 2022-06-03: qty 24

## 2022-06-03 MED ORDER — HEPARIN SOD (PORK) LOCK FLUSH 100 UNIT/ML IV SOLN
500.0000 [IU] | Freq: Once | INTRAVENOUS | Status: AC | PRN
  Administered 2022-06-03: 500 [IU]
  Filled 2022-06-03: qty 5

## 2022-06-03 MED ORDER — SODIUM CHLORIDE 0.9 % IV SOLN
Freq: Once | INTRAVENOUS | Status: AC
  Filled 2022-06-03: qty 250

## 2022-06-03 MED ORDER — ACETAMINOPHEN 325 MG PO TABS
650.0000 mg | ORAL_TABLET | Freq: Once | ORAL | Status: AC
  Administered 2022-06-03: 650 mg via ORAL
  Filled 2022-06-03: qty 2

## 2022-06-03 MED ORDER — DIPHENHYDRAMINE HCL 25 MG PO CAPS
50.0000 mg | ORAL_CAPSULE | Freq: Once | ORAL | Status: AC
  Administered 2022-06-03: 50 mg via ORAL
  Filled 2022-06-03: qty 2

## 2022-06-03 NOTE — Progress Notes (Signed)
Hematology/Oncology Progress note [phone] [phone]     Patient Care Team: Leanna Sato, MD as PCP - General (Family Medicine) End, Cristal Deer, MD as PCP - Cardiology (Cardiology) Jim Like, RN as Oncology Nurse Navigator [name], MD as Consulting Physician (Oncology) Lemar Livings, Merrily Pew, MD (General Surgery) Carmina Miller, MD as Referring Physician (Radiation Oncology) Carmina Miller, MD as Radiation Oncologist (Radiation Oncology) Carmina Miller, MD as Radiation Oncologist (Radiation Oncology)   Name of the patient: Theresa Chaney  [id number]  1981/06/06   Date of visit: 06/03/22   ASSESSMENT & PLAN:   Cancer Staging  Breast cancer of upper-outer quadrant of right female breast [facility]) Staging form: Breast, AJCC 8th Edition - Clinical: G2, ER+, PR+, HER2- - Signed by [name], MD on 04/05/2018 - Pathologic stage from 04/04/2018: No Stage Recommended (ypT3, pN2, cM0, G3, ER+, PR-, HER2-) - Signed by [name], MD on 04/04/2018 - Pathologic: Stage IV (rpTX, pNX, cM1, ER+, PR-, HER2+) - Signed by [name], MD on 12/10/2020   Breast cancer of upper-outer quadrant of right female breast (HCC) Recurrent breast cancer with isolated bone metastasis, stage IV #Initially stage IIIA right breast cancer, ER positive, HER-2 negative status post bilateral oophorectomy,Right mastectomy and right axillary lymph node dissection, status post implant, and implant removal.  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 Estrogen positive, HER-2 positive breast cancer.- s/p Palliative radiation to T7  Labs reviewed and discussed Proceed with Kanjinti Perjecta.   Continue fulvestrant monthly.  Neuropathy due to chemotherapeutic drug (HCC) continue Lyrica and nortriptyline symptoms stable  Bone metastasis status post radiation. Continue Zometa monthly.  Continue oxycodone as  needed.  last PET scan showed no evidence of local recurrence, distant metastasis disease.  Hypokalemia She does not tolerate oral potassium.  Potassium is stable.  Encounter for monoclonal antibody treatment for malignancy Antibody treatment plan as list above.  Rheumatoid arteritis (HCC) Follow up with  rheumatology.  Patient is on methotrexate,  plaquenil.   Thyroid nodule Left lobe nodules are small no need for follow-up.   Right lobe nodule 1.1 cm, repeat thyroid ultrasound in Aug 2023.  Nonischemic cardiomyopathy (HCC) Follow up with cardiology. Chronic tachycardia Echo results were reviewed.  No orders of the defined types were placed in this encounter.  lab MD Kanjinti Perjecta 3 weeks.  fulvestrant/Zometa Q 4 weeks   All questions were answered. The patient knows to call the clinic with any problems, questions or concerns.  [name], MD, PhD [facility] Hematology Oncology 06/03/2022      REASON FOR VISIT Follow up for  treatment of breast cancer  Oncology History Cancer TREATMENT Neoadjuvant ddAC +one dose of Taxol, due to lack of response, surgery was offered. Case was discussed on breast tumor board. 03/19/2018 S/p right mastectomy and right axillary dissection, immediate breast reconstruction with placement of expanders.  ypT3 ypN2, + lymphovascular invasion,  Grade 3, margin is negative, close. ER 90%, PR 0%, HER2 IHC negative.  Also had elective bilateral salpingo-oophorectomy..  # 06/11/2018 s/p 11 cycles Taxol adjuvantly. Tolerated well.  # She has obtained dental clearance for starting Zometa.  S/p Zometa on 6/3/ 2019 # s/p adjuvant right chest wall radiation, finished 10/10/2018 #06/03/2019 underwent elective left prophylactic mastectomy and sentinel lymph node biopsy of left axilla.  # 06/03/2019 underwent elective left prophylactic mastectomy and sentinel lymph node biopsy of left axilla. Pathology negative for malignancy. Status post Mediport removal on  06/03/2019. She also underwent right implant removal on 06/03/2019. She also underwent right implant removal on 06/03/2019. #Negative genetic testing  #  chemotherapy-induced neuropathy, bilateral fingertips and lower extremities.  Patient is on Lyrica and nortriptyline.  Follows up with neurology. She follows up with lymphedema clinic.  #  07/07/2020, MRI thoracic spine without contrast showed lesions involving the T7 posterior elements most concerning for metastatic lesion.  No evidence of epidural tumor.  Minimal thoracic spondylosis without stenosis. MRI was reviewed by me and a PET scan was obtained for further evaluation. 07/20/2020, PET scan showed hypermetabolic metastasis involving the posterior element of T7, no additional evidence of metastasis in the neck, chest, abdomen or pelvis.  # 07/29/2020 T7 lesion biopsy showed metastatic carcinoma, compatible with breast origin.  Receptor status staining showed ER 71-80% positive, PR negative, HER-2 positive IHC 3+ # Patient finished spine radiation on 08/31/2020   positive for COVID-19 on 01/11/2021  # 03/15/2021, PET scan showed no focal hypermetabolic activity to suggest skeletal metastasis.  Mild hypermetabolic activity along the right T7-8 paraspinal musculature.  Max SUV 3.3.  Likely postprocedural-  #Lower extremity cramps when she eats banana or oral potassium supplementation.not able to tolerate po potassium.   # 09/28/2021, echocardiogram showed further decrease of LVEF to 45% #11/12/21 echocardiogram showed LVEF of 45-50%. # 02/25/2022, echocardiogram showed LVEF 55-60%.  INTERVAL HISTORY 41 yo female with above oncology history reviewed by me presents for follow-up of management of metastatic breast cancer.  Patient reports feeling well.  She has no new complaint Chronic back pain, unchanged.   Echo is done, LVEF is slightly decreased comparing to last Echo.  Intermittent diarrhea, manageable.  She has no new complaints.      Review of Systems  Constitutional:  Negative for chills, fever, malaise/fatigue and weight loss.  HENT:  Negative for sore throat.   Eyes:  Negative for redness.  Respiratory:  Negative for cough, shortness of breath and wheezing.   Cardiovascular:  Negative for chest pain, palpitations and leg swelling.  Gastrointestinal:  Negative for abdominal pain, blood in stool, heartburn, nausea and vomiting.  Genitourinary:  Negative for dysuria.  Musculoskeletal:  Positive for back pain and joint pain. Negative for myalgias.  Skin:  Negative for rash.  Neurological:  Positive for tingling. Negative for dizziness and tremors.  Endo/Heme/Allergies:  Does not bruise/bleed easily.  Psychiatric/Behavioral:  Negative for hallucinations.     No Known Allergies  Patient Active Problem List   Diagnosis Date Noted   Breast cancer of upper-outer quadrant of right female breast (HCC) 03/19/2018    Priority: High   Hypokalemia 06/03/2022    Priority: Medium    Bone metastasis 03/25/2021    Priority: Medium    Encounter for monoclonal antibody treatment for malignancy 12/10/2020    Priority: Medium    Hypocalcemia 11/19/2020    Priority: Medium    Encounter for antineoplastic chemotherapy 10/29/2020    Priority: Medium    Neuropathy due to chemotherapeutic drug (HCC) 08/11/2020    Priority: Medium    Goals of care, counseling/discussion 08/11/2020    Priority: Low   Tachycardia, unspecified 04/15/2021   Bone lesion 11/19/2020   Inflammatory arthritis 11/19/2020   HER2-positive carcinoma of breast (HCC) 08/11/2020   Rheumatoid arteritis (HCC) 10/13/2019   Bilateral hand swelling 10/03/2019   Fracture of neck of metacarpal bone 05/14/2019   Chronic fatigue 04/09/2019   Polyarthralgia 04/09/2019   Status post right breast reconstruction 02/26/2019   Status post right mastectomy  02/26/2019   Mastalgia 02/15/2019   Shortness of breath 08/23/2018   Nonischemic cardiomyopathy (HCC) 08/23/2018    Preprocedural cardiovascular examination 08/23/2018   Tachycardia 08/23/2018   Palpitations 08/23/2018   Estrogen receptor positive status (ER+) 04/04/2018   Acquired absence of right breast and nipple 04/03/2018   Family history of cancer    Malignant neoplasm of overlapping sites of right breast in female, estrogen receptor positive (HCC) 10/19/2017   Gastroesophageal reflux disease without esophagitis 02/24/2017   Generalized anxiety disorder 10/03/2014   Headache 10/03/2014     Past Medical History:  Diagnosis Date   Anemia    Arthritis    BRCA negative 11/26/2017   Breast cancer (HCC) 10/11/2017   Multifocal, ER positive, PR negative, HER-2 negative. ypT3 ypN2a 8.7 cm, 4/15 nodes   Cardiomyopathy (HCC)    a. 10/2017 Echo: EF 60-65%, no rwma, Gr1 DD, nl RV size/fxn; b. 04/2018 Echo: EF 55-60%, no rwma, Nl RV size/fxn; c. 08/2018 Echo: EF 45%, diff HK, ? HK of antsept wall. Gr1 DD. Mild MR. Mild LAE/RAE. Mod dil RV.    Chronic bronchitis (HCC) 11/2017   COPD (chronic obstructive pulmonary disease) (HCC)    MILD PER CXR   Depression    Family history of cancer    GERD (gastroesophageal reflux disease)    Headache    MIGRAINES   Heart murmur    ASYMPTOMATIC   Personal history of chemotherapy    current for right breast ca     Past Surgical History:  Procedure Laterality Date   AXILLARY LYMPH NODE DISSECTION Right 03/19/2018   Procedure: AXILLARY LYMPH NODE DISSECTION;  Surgeon: Earline Mayotte, MD;  Location: [facility];  Service: General;  Laterality: Right;   BREAST BIOPSY Right 10/11/2017   12:30 posterior coil clip invasive mammary carcinoma   BREAST BIOPSY Right 10/11/2017   11:30 middle depth ribbon clip DCIS   BREAST BIOPSY Right 10/11/2017   5:30 anterior depth x shape invasive ductal carcinoma   BREAST IMPLANT REMOVAL Right 06/03/2019   Procedure: REMOVAL OF RIGHT BREAST IMPLANTS;  Surgeon: Peggye Form, DO;  Location: [facility];  Service: Plastics;   Laterality: Right;   BREAST RECONSTRUCTION WITH PLACEMENT OF TISSUE EXPANDER AND FLEX HD (ACELLULAR HYDRATED DERMIS) Right 03/19/2018   Procedure: BREAST RECONSTRUCTION WITH PLACEMENT OF TISSUE EXPANDER AND FLEX HD (ACELLULAR HYDRATED DERMIS);  Surgeon: Peggye Form, DO;  Location: [facility];  Service: Plastics;  Laterality: Right;   CARPAL TUNNEL RELEASE Bilateral 2020   CHOLECYSTECTOMY N/A 04/27/2020   Procedure: LAPAROSCOPIC CHOLECYSTECTOMY WITH INTRAOPERATIVE CHOLANGIOGRAM;  Surgeon: Earline Mayotte, MD;  Location: [facility];  Service: General;  Laterality: N/A;   ESOPHAGOGASTRODUODENOSCOPY (EGD) WITH PROPOFOL N/A 04/17/2020   Procedure: ESOPHAGOGASTRODUODENOSCOPY (EGD) WITH PROPOFOL;  Surgeon: Earline Mayotte, MD;  Location: [facility];  Service: Endoscopy;  Laterality: N/A;  with biopsy   LAPAROSCOPIC BILATERAL SALPINGO OOPHERECTOMY Bilateral 03/19/2018   Procedure: LAPAROSCOPIC BILATERAL SALPINGO OOPHORECTOMY;  Surgeon: Christeen Douglas, MD;  Location: [facility];  Service: Gynecology;  Laterality: Bilateral;   MASTECTOMY Right 03/2018   MASTECTOMY W/ SENTINEL NODE BIOPSY Right 03/19/2018   Procedure: MASTECTOMY WITH SENTINEL LYMPH NODE BIOPSY;  Surgeon: Earline Mayotte, MD;  Location: [facility];  Service: General;  Laterality: Right;   PORT-A-CATH REMOVAL Left 06/03/2019   Procedure: REMOVAL PORT-A-CATH;  Surgeon: Earline Mayotte, MD;  Location: [facility];  Service: General;  Laterality: Left;   PORTACATH PLACEMENT Left 10/24/2017   Procedure: INSERTION PORT-A-CATH;  Surgeon: Earline Mayotte, MD;  Location: [facility];  Service: General;  Laterality: Left;   PORTACATH PLACEMENT Right 09/28/2020   Procedure: INSERTION PORT-A-CATH;  Surgeon: Earline Mayotte, MD;  Location: [facility];  Service: General;  Laterality: Right;   REMOVAL OF TISSUE EXPANDER AND PLACEMENT OF IMPLANT Right 07/20/2018   Procedure: REMOVAL OF RIGHT BREAST TISSUE EXPANDER AND PLACEMENT OF IMPLANT;  Surgeon:  Peggye Form, DO;  Location: [facility];  Service: Plastics;  Laterality: Right;   SIMPLE MASTECTOMY WITH AXILLARY SENTINEL NODE BIOPSY Left 06/03/2019   Procedure: SIMPLE MASTECTOMY LEFT;  Surgeon: Earline Mayotte, MD;  Location: [facility];  Service: General;  Laterality: Left;    Social History   Socioeconomic History   Marital status: Married    Spouse name: Not on file   Number of children: Not on file   Years of education: Not on file   Highest education level: Not on file  Occupational History   Occupation: pharmacy tech    Comment: [employer]ist [employer]   Tobacco Use   Smoking status: Every Day    Packs/day: 0.50    Years: 18.00    Total pack years: 9.00    Types: Cigarettes    Start date: 06/21/2018   Smokeless tobacco: Never  Vaping Use   Vaping Use: Never used  Substance and Sexual Activity   Alcohol use: No   Drug use: No   Sexual activity: Yes    Birth control/protection: Injection, Other-see comments    Comment: has had hysterectomy  Other Topics Concern   Not on file  Social History Narrative   Lives at home with husband and daughter   Social Determinants of Health   Financial Resource Strain: Not on file  Food Insecurity: Not on file  Transportation Needs: Not on file  Physical Activity: Not on file  Stress: Not on file  Social Connections: Not on file  Intimate Partner Violence: Not on file     Family History  Problem Relation Age of Onset   Diabetes Father    Hypertension Father    Hyperlipidemia Father    Heart attack Father 10       "mild"   Melanoma Maternal Aunt        other aunts with BCC/SCC/Melanoma   Cervical cancer Maternal Aunt 4       daughter w/ cervical cancer as well   Lung cancer Maternal Aunt    Melanoma Maternal Uncle        other uncles with BCC/SCC/Melanoma   Breast cancer Paternal Aunt    Bladder Cancer Maternal Grandmother   Biological mother had Grave's disease.     Current Outpatient Medications:    acetaminophen (TYLENOL) 500 MG tablet, Take 500 mg by mouth every 6 (six) hours as needed., Disp: , Rfl:    albuterol (VENTOLIN HFA) 108 (90 Base) MCG/ACT inhaler, Inhale 2 puffs into the lungs every 6 (six) hours as needed for wheezing or shortness of breath. , Disp: , Rfl:    CALCIUM CARBONATE-VITAMIN D PO, Take 600-800 mg by mouth in the morning, at noon, and at bedtime., Disp: , Rfl:    chlorhexidine (PERIDEX) 0.12 % solution, Use as directed 15 mLs in the mouth or throat 2 (two) times daily., Disp: 120 mL, Rfl: 0   cyanocobalamin (,VITAMIN B-12,) 1000 MCG/ML injection, Inject 1,000 mcg into the skin every 30 (thirty) days., Disp: , Rfl:    cyclobenzaprine (FLEXERIL) 5 MG tablet,  Take 5 mg by mouth 3 (three) times daily as needed for muscle spasms., Disp: , Rfl:    diphenoxylate-atropine (LOMOTIL) 2.5-0.025 MG tablet, Take 1 tablet by mouth 4 (four) times daily as needed for diarrhea or loose stools., Disp: 60 tablet, Rfl: 0   escitalopram (LEXAPRO) 20 MG tablet, Take 20 mg by mouth daily., Disp: , Rfl:    esomeprazole (NEXIUM) 40 MG capsule, Take 40 mg by mouth daily before breakfast. , Disp: , Rfl:    Eszopiclone 3 MG TABS, Take 3 mg by mouth at bedtime as needed (sleep). , Disp: , Rfl:    folic acid (FOLVITE) 1 MG tablet, Take 1 mg by mouth daily., Disp: , Rfl:    hydroxychloroquine (PLAQUENIL) 200 MG tablet, Take 200 mg by mouth daily., Disp: , Rfl:    ibuprofen (ADVIL) 800 MG tablet, Take 800 mg by mouth every 8 (eight) hours as needed for moderate pain., Disp: , Rfl:    loperamide (IMODIUM) 2 MG capsule, Take 2 tablets with onset of diarrhea, then take 1 tablet every 2 hours until diarrhea stops. Maximum 8 tablets in 24hours, Disp: 30 capsule, Rfl: 3   loratadine (CLARITIN) 10 MG tablet, Take 10 mg by mouth daily. , Disp: , Rfl:    LORazepam (ATIVAN) 1 MG tablet, Take 1 mg by mouth 3 (three) times daily., Disp: , Rfl:    losartan (COZAAR) 25 MG  tablet, Take 0.5 tablets (12.5 mg total) by mouth daily., Disp: 45 tablet, Rfl: 3   Magnesium 500 MG TABS, Take 500 mg by mouth 2 (two) times daily., Disp: , Rfl:    methotrexate (RHEUMATREX) 2.5 MG tablet, Take 25 mg by mouth every Sunday. 10 tablets once a week, Disp: , Rfl:    metoprolol succinate (TOPROL XL) 25 MG 24 hr tablet, Take 0.5 tablets (12.5 mg total) by mouth daily., Disp: 45 tablet, Rfl: 3   mupirocin ointment (BACTROBAN) 2 %, Place 1 application into the nose 2 (two) times daily. Use in each nostril twice daily for five (5) days., Disp: 22 g, Rfl: 5   nortriptyline (PAMELOR) 10 MG capsule, Take 30 mg by mouth at bedtime. , Disp: , Rfl:    oxyCODONE (OXY IR/ROXICODONE) 5 MG immediate release tablet, Take 1 tablet (5 mg total) by mouth every 6 (six) hours as needed for moderate pain or severe pain., Disp: 90 tablet, Rfl: 0   phentermine (ADIPEX-P) 37.5 MG tablet, Take 37.5 mg by mouth daily before breakfast. , Disp: , Rfl:    pregabalin (LYRICA) 150 MG capsule, Take 150 mg by mouth 2 (two) times daily., Disp: , Rfl:    promethazine (PHENERGAN) 25 MG tablet, Take 1 tablet (25 mg total) by mouth every 8 (eight) hours as needed for nausea or vomiting., Disp: 90 tablet, Rfl: 0   pyridOXINE (VITAMIN B-6) 100 MG tablet, Take 100 mg by mouth daily., Disp: , Rfl:    topiramate (TOPAMAX) 50 MG tablet, Take 50 mg by mouth 2 (two) times daily. , Disp: , Rfl:    vitamin C (ASCORBIC ACID) 500 MG tablet, Take 500 mg by mouth daily., Disp: , Rfl:    lidocaine-prilocaine (EMLA) cream, Apply to port and cover 1-2 hours prior to appointment (Patient not taking: Reported on 03/04/2022), Disp: 30 g, Rfl: 3   PARoxetine (PAXIL-CR) 12.5 MG 24 hr tablet, Take 1 tablet by mouth daily. (Patient not taking: Reported on 03/04/2022), Disp: , Rfl:  No current facility-administered medications for this visit.  Facility-Administered Medications  Ordered in Other Visits:    heparin lock flush 100 unit/mL, 500 Units,  Intravenous, Once, [name], MD   sodium chloride flush (NS) 0.9 % injection 10 mL, 10 mL, Intravenous, PRN, [name], MD, 10 mL at 11/19/18 1249   sodium chloride flush (NS) 0.9 % injection 10 mL, 10 mL, Intravenous, Once, [name], MD   Physical exam:  Vitals:   06/03/22 0837  BP: 114/84  Pulse: (!) 123  Temp: (!) 96.5 F (35.8 C)  TempSrc: Tympanic  Weight: 179 lb (81.2 kg)  Height: 5\' 7"  (1.702 m)  ECOG 1 Physical Exam Constitutional:      General: She is not in acute distress.    Appearance: She is not diaphoretic.  HENT:     Head: Normocephalic and atraumatic.     Nose: Nose normal.     Mouth/Throat:     Pharynx: No oropharyngeal exudate.  Eyes:     General: No scleral icterus.    Pupils: Pupils are equal, round, and reactive to light.  Neck:     Vascular: No JVD.  Cardiovascular:     Rate and Rhythm: Normal rate and regular rhythm.     Heart sounds: Normal heart sounds. No murmur heard. Pulmonary:     Effort: Pulmonary effort is normal. No respiratory distress.     Breath sounds: Normal breath sounds. No wheezing or rales.  Chest:     Chest wall: No tenderness.  Abdominal:     General: Bowel sounds are normal. There is no distension.     Palpations: Abdomen is soft. There is no mass.     Tenderness: There is no abdominal tenderness. There is no rebound.  Musculoskeletal:        General: Normal range of motion.     Cervical back: Normal range of motion and neck supple.  Lymphadenopathy:     Cervical: No cervical adenopathy.  Skin:    General: Skin is warm and dry.     Findings: No erythema or rash.  Neurological:     Mental Status: She is alert and oriented to person, place, and time.     Cranial Nerves: No cranial nerve deficit.     Motor: No abnormal muscle tone.     Coordination: Coordination normal.  Psychiatric:        Mood and Affect: Affect normal.        Labs     Latest Ref Rng & Units 05/13/2022    8:29 AM  CMP  Glucose 70 - 99 mg/dL  784   BUN 6 - 20 mg/dL 20   Creatinine 6.96 - 1.00 mg/dL 2.95   Sodium 284 - 132 mmol/L 138   Potassium 3.5 - 5.1 mmol/L 3.3   Chloride 98 - 111 mmol/L 105   CO2 22 - 32 mmol/L 24   Calcium 8.9 - 10.3 mg/dL 9.7   Total Protein 6.5 - 8.1 g/dL 7.5   Total Bilirubin 0.3 - 1.2 mg/dL 0.2   Alkaline Phos 38 - 126 U/L 65   AST 15 - 41 U/L 28   ALT 0 - 44 U/L 20       Latest Ref Rng & Units 06/03/2022    8:18 AM  CBC  WBC 4.0 - 10.5 K/uL 7.2   Hemoglobin 12.0 - 15.0 g/dL 44.0   Hematocrit 10.2 - 46.0 % 37.9   Platelets 150 - 400 K/uL 303    RADIOGRAPHIC STUDIES: I have personally reviewed the radiological images as  listed and agreed with the findings in the report. ECHOCARDIOGRAM LIMITED  Result Date: 05/17/2022    ECHOCARDIOGRAM LIMITED REPORT   Patient Name:   SUNDA WIEMKEN Date of Exam: 05/17/2022 Medical Rec #:  [id number]      Height:       67.0 in Accession #:    [id number]     Weight:       182.0 lb Date of Birth:  1981-04-03      BSA:          1.943 m Patient Age:    41 years       BP:           120/70 mmHg Patient Gender: F              HR:           91 bpm. Exam Location:  [facility] Procedure: Limited Echo, Limited Color Doppler and Strain Analysis Indications:    I42.7,T45.1X5A (ICD-10-CM) - Cardiomyopathy due to chemotherapy  History:        Patient has prior history of Echocardiogram examinations, most                 recent 02/25/2022. Risk Factors:Current Smoker. Breast cancer of                 upper-outer quadrant of right female breast.  Sonographer:    Dondra Prader RVT Referring Phys: [id number] Raymon Mutton DUNN IMPRESSIONS  1. Left ventricular ejection fraction, by estimation, is 50 to 55%. The left ventricle has low normal function. The left ventricle has no regional wall motion abnormalities. The average left ventricular global longitudinal strain is -11.7 %.  2. Right ventricular systolic function is normal. The right ventricular size is normal.  3. The mitral valve is normal in  structure. No evidence of mitral valve regurgitation. No evidence of mitral stenosis.  4. The aortic valve is normal in structure. Aortic valve regurgitation is not visualized. No aortic stenosis is present.  5. The inferior vena cava is normal in size with greater than 50% respiratory variability, suggesting right atrial pressure of 3 mmHg. Comparison(s): 02/25/2022-EF 60%-65%. FINDINGS  Left Ventricle: Left ventricular ejection fraction, by estimation, is 50 to 55%. The left ventricle has low normal function. The left ventricle has no regional wall motion abnormalities. The average left ventricular global longitudinal strain is -11.7 %. The left ventricular internal cavity size was normal in size. There is no left ventricular hypertrophy. Right Ventricle: The right ventricular size is normal. No increase in right ventricular wall thickness. Right ventricular systolic function is normal. Left Atrium: Left atrial size was normal in size. Right Atrium: Right atrial size was normal in size. Pericardium: There is no evidence of pericardial effusion. Mitral Valve: The mitral valve is normal in structure. No evidence of mitral valve stenosis. Tricuspid Valve: The tricuspid valve is normal in structure. Tricuspid valve regurgitation is not demonstrated. No evidence of tricuspid stenosis. Aortic Valve: The aortic valve is normal in structure. Aortic valve regurgitation is not visualized. No aortic stenosis is present. Pulmonic Valve: The pulmonic valve was normal in structure. Pulmonic valve regurgitation is not visualized. No evidence of pulmonic stenosis. Aorta: The aortic root is normal in size and structure. Venous: The inferior vena cava is normal in size with greater than 50% respiratory variability, suggesting right atrial pressure of 3 mmHg. IAS/Shunts: No atrial level shunt detected by color flow Doppler. LEFT VENTRICLE PLAX 2D LVIDd:  4.70 cm LVIDs:         3.30 cm     2D Longitudinal Strain LV PW:          1.00 cm     2D Strain GLS Avg:     -11.7 % LV IVS:        0.80 cm  LV Volumes (MOD) LV vol d, MOD A2C: 86.2 ml LV vol d, MOD A4C: 95.5 ml LV vol s, MOD A2C: 46.9 ml LV vol s, MOD A4C: 44.7 ml LV SV MOD A2C:     39.3 ml LV SV MOD A4C:     95.5 ml LV SV MOD BP:      46.0 ml RIGHT VENTRICLE TAPSE (M-mode): 1.6 cm LEFT ATRIUM             Index LA diam:        3.00 cm 1.54 cm/m LA Vol (A2C):   36.3 ml 18.67 ml/m LA Vol (A4C):   26.7 ml 13.74 ml/m LA Biplane Vol: 35.8 ml 18.43 ml/m Julien Nordmann MD Electronically signed by Julien Nordmann MD Signature Date/Time: 05/17/2022/7:33:23 PM    Final    NM PET Image Restag (PS) Skull Base To Thigh  Result Date: 03/16/2022 CLINICAL DATA:  Subsequent treatment strategy for breast cancer. EXAM: NUCLEAR MEDICINE PET SKULL BASE TO THIGH TECHNIQUE: 7.3 mCi F-18 FDG was injected intravenously. Full-ring PET imaging was performed from the skull base to thigh after the radiotracer. CT data was obtained and used for attenuation correction and anatomic localization. Fasting blood glucose: 99 mg/dl COMPARISON:  Multiple previous PET CTs. The most recent is 10/14/2021 FINDINGS: Mediastinal blood pool activity: SUV max 2.75 Liver activity: SUV max NA NECK: No hypermetabolic lymph nodes in the neck. Incidental CT findings: none CHEST: No hypermetabolic mediastinal or hilar nodes. No suspicious pulmonary nodules on the CT scan. Bilateral mastectomies are noted. No hypermetabolic chest wall lesions to suggest recurrent breast cancer. No enlarged or hypermetabolic supraclavicular or axillary nodes. Incidental CT findings: The right-sided Port-A-Cath is stable. ABDOMEN/PELVIS: No abnormal hypermetabolic activity within the liver, pancreas, adrenal glands, or spleen. No hypermetabolic lymph nodes in the abdomen or pelvis. Incidental CT findings: The gallbladder is surgically absent. No biliary dilatation. SKELETON: No lytic or sclerotic hypermetabolic bone lesions to suggest osseous metastatic  disease. Diffuse but symmetric uptake is noted in the paraspinal musculature in the thoracic region. Incidental CT findings: none IMPRESSION: Stable PET-CT. No findings for local recurrent breast cancer, locoregional adenopathy or distant metastatic disease. Electronically Signed   By: Rudie Meyer M.D.   On: 03/16/2022 14:40

## 2022-06-04 ENCOUNTER — Encounter: Payer: Self-pay | Admitting: Oncology

## 2022-06-04 DIAGNOSIS — E041 Nontoxic single thyroid nodule: Secondary | ICD-10-CM | POA: Insufficient documentation

## 2022-06-04 NOTE — Assessment & Plan Note (Signed)
She does not tolerate oral potassium.  Potassium is stable. 

## 2022-06-06 ENCOUNTER — Ambulatory Visit: Payer: Medicare HMO | Admitting: Physician Assistant

## 2022-06-10 ENCOUNTER — Inpatient Hospital Stay: Payer: Medicare HMO

## 2022-06-10 VITALS — BP 106/67 | HR 72 | Temp 97.1°F | Resp 18

## 2022-06-10 DIAGNOSIS — Z95828 Presence of other vascular implants and grafts: Secondary | ICD-10-CM

## 2022-06-10 DIAGNOSIS — C50411 Malignant neoplasm of upper-outer quadrant of right female breast: Secondary | ICD-10-CM

## 2022-06-10 DIAGNOSIS — C50919 Malignant neoplasm of unspecified site of unspecified female breast: Secondary | ICD-10-CM

## 2022-06-10 DIAGNOSIS — Z5111 Encounter for antineoplastic chemotherapy: Secondary | ICD-10-CM | POA: Diagnosis not present

## 2022-06-10 DIAGNOSIS — Z5112 Encounter for antineoplastic immunotherapy: Secondary | ICD-10-CM

## 2022-06-10 LAB — CBC WITH DIFFERENTIAL/PLATELET
Abs Immature Granulocytes: 0.01 10*3/uL (ref 0.00–0.07)
Basophils Absolute: 0 10*3/uL (ref 0.0–0.1)
Basophils Relative: 1 %
Eosinophils Absolute: 0.1 10*3/uL (ref 0.0–0.5)
Eosinophils Relative: 1 %
HCT: 30.7 % — ABNORMAL LOW (ref 36.0–46.0)
Hemoglobin: 10.3 g/dL — ABNORMAL LOW (ref 12.0–15.0)
Immature Granulocytes: 0 %
Lymphocytes Relative: 31 %
Lymphs Abs: 2.3 10*3/uL (ref 0.7–4.0)
MCH: 30.5 pg (ref 26.0–34.0)
MCHC: 33.6 g/dL (ref 30.0–36.0)
MCV: 90.8 fL (ref 80.0–100.0)
Monocytes Absolute: 0.8 10*3/uL (ref 0.1–1.0)
Monocytes Relative: 11 %
Neutro Abs: 4.1 10*3/uL (ref 1.7–7.7)
Neutrophils Relative %: 56 %
Platelets: 292 10*3/uL (ref 150–400)
RBC: 3.38 MIL/uL — ABNORMAL LOW (ref 3.87–5.11)
RDW: 13.6 % (ref 11.5–15.5)
WBC: 7.4 10*3/uL (ref 4.0–10.5)
nRBC: 0 % (ref 0.0–0.2)

## 2022-06-10 LAB — COMPREHENSIVE METABOLIC PANEL
ALT: 30 U/L (ref 0–44)
AST: 32 U/L (ref 15–41)
Albumin: 4 g/dL (ref 3.5–5.0)
Alkaline Phosphatase: 64 U/L (ref 38–126)
Anion gap: 8 (ref 5–15)
BUN: 19 mg/dL (ref 6–20)
CO2: 22 mmol/L (ref 22–32)
Calcium: 8.6 mg/dL — ABNORMAL LOW (ref 8.9–10.3)
Chloride: 110 mmol/L (ref 98–111)
Creatinine, Ser: 0.86 mg/dL (ref 0.44–1.00)
GFR, Estimated: >60 mL/min (ref >60)
Glucose, Bld: 107 mg/dL — ABNORMAL HIGH (ref 70–99)
Potassium: 3.4 mmol/L — ABNORMAL LOW (ref 3.5–5.1)
Sodium: 140 mmol/L (ref 135–145)
Total Bilirubin: 0.3 mg/dL (ref 0.3–1.2)
Total Protein: 7 g/dL (ref 6.5–8.1)

## 2022-06-10 LAB — MAGNESIUM: Magnesium: 2.1 mg/dL (ref 1.7–2.4)

## 2022-06-10 MED ORDER — FULVESTRANT 250 MG/5ML IM SOSY
500.0000 mg | PREFILLED_SYRINGE | INTRAMUSCULAR | Status: DC
  Filled 2022-06-10: qty 10

## 2022-06-10 MED ORDER — HEPARIN SOD (PORK) LOCK FLUSH 100 UNIT/ML IV SOLN
500.0000 [IU] | Freq: Once | INTRAVENOUS | Status: AC
  Administered 2022-06-10: 500 [IU] via INTRAVENOUS
  Filled 2022-06-10: qty 5

## 2022-06-10 MED ORDER — ZOLEDRONIC ACID 4 MG/100ML IV SOLN
4.0000 mg | Freq: Once | INTRAVENOUS | Status: AC
  Administered 2022-06-10: 4 mg via INTRAVENOUS
  Filled 2022-06-10: qty 100

## 2022-06-10 MED ORDER — FULVESTRANT 250 MG/5ML IM SOSY
500.0000 mg | PREFILLED_SYRINGE | Freq: Once | INTRAMUSCULAR | Status: AC
  Administered 2022-06-10: 500 mg via INTRAMUSCULAR
  Filled 2022-06-10: qty 10

## 2022-06-10 MED ORDER — SODIUM CHLORIDE 0.9 % IV SOLN
INTRAVENOUS | Status: DC
  Filled 2022-06-10: qty 250

## 2022-06-10 NOTE — Patient Instructions (Signed)
[facility] CANCER CTR AT [place]  Discharge Instructions: Thank you for choosing [facility] to provide your oncology and hematology care.  If you have a lab appointment with the [facility], please go directly to the [facility] and check in at the registration area.  Wear comfortable clothing and clothing appropriate for easy access to any Portacath or PICC line.   We strive to give you quality time with your provider. You may need to reschedule your appointment if you arrive late (15 or more minutes).  Arriving late affects you and other patients whose appointments are after yours.  Also, if you miss three or more appointments without notifying the office, you may be dismissed from the clinic at the provider's discretion.      For prescription refill requests, have your pharmacy contact our office and allow 72 hours for refills to be completed.    Today you received the following chemotherapy and/or immunotherapy agents ZOMETA and FULVESTRANT      To help prevent nausea and vomiting after your treatment, we encourage you to take your nausea medication as directed.  BELOW ARE SYMPTOMS THAT SHOULD BE REPORTED IMMEDIATELY: *FEVER GREATER THAN 100.4 F (38 C) OR HIGHER *CHILLS OR SWEATING *NAUSEA AND VOMITING THAT IS NOT CONTROLLED WITH YOUR NAUSEA MEDICATION *UNUSUAL SHORTNESS OF BREATH *UNUSUAL BRUISING OR BLEEDING *URINARY PROBLEMS (pain or burning when urinating, or frequent urination) *BOWEL PROBLEMS (unusual diarrhea, constipation, pain near the anus) TENDERNESS IN MOUTH AND THROAT WITH OR WITHOUT PRESENCE OF ULCERS (sore throat, sores in mouth, or a toothache) UNUSUAL RASH, SWELLING OR PAIN  UNUSUAL VAGINAL DISCHARGE OR ITCHING   Items with * indicate a potential emergency and should be followed up as soon as possible or go to the Emergency Department if any problems should occur.  Please show the CHEMOTHERAPY ALERT CARD or IMMUNOTHERAPY ALERT CARD at  check-in to the Emergency Department and triage nurse.  Should you have questions after your visit or need to cancel or reschedule your appointment, please contact [facility] CANCER [place] AT [place]  [phone] and follow the prompts.  Office hours are 8:00 a.m. to 4:30 p.m. Monday - Friday. Please note that voicemails left after 4:00 p.m. may not be returned until the following business day.  We are closed weekends and major holidays. You have access to a nurse at all times for urgent questions. Please call the main number to the clinic [phone] and follow the prompts.  For any non-urgent questions, you may also contact your provider using MyChart. We now offer e-Visits for anyone 4 and older to request care online for non-urgent symptoms. For details visit [email or web address].   Also download the MyChart app! Go to the app store, search "MyChart", open the app, select [facility], and log in with your MyChart username and password.  Masks are optional in the cancer centers. If you would like for your care team to wear a mask while they are taking care of you, please let them know. For doctor visits, patients may have with them one support person who is at least 39 years old. At this time, visitors are not allowed in the infusion area.  Zoledronic Acid Injection (Cancer) What is this medication? ZOLEDRONIC ACID (ZOE le dron ik AS id) treats high calcium levels in the blood caused by cancer. It may also be used with chemotherapy to treat weakened bones caused by cancer. It works by slowing down the release of calcium from bones. This lowers calcium  levels in your blood. It also makes your bones stronger and less likely to break (fracture). It belongs to a group of medications called bisphosphonates. This medicine may be used for other purposes; ask your health care provider or pharmacist if you have questions. COMMON BRAND NAME(S): Zometa, Zometa Powder What should I tell my care  team before I take this medication? They need to know if you have any of these conditions: Dehydration Dental disease Kidney disease Liver disease Low levels of calcium in the blood Lung or breathing disease, such as asthma Receiving steroids, such as dexamethasone or prednisone An unusual or allergic reaction to zoledronic acid, other medications, foods, dyes, or preservatives Pregnant or trying to get pregnant Breast-feeding How should I use this medication? This medication is injected into a vein. It is given by your care team in a hospital or clinic setting. Talk to your care team about the use of this medication in children. Special care may be needed. Overdosage: If you think you have taken too much of this medicine contact a poison control center or emergency room at once. NOTE: This medicine is only for you. Do not share this medicine with others. What if I miss a dose? Keep appointments for follow-up doses. It is important not to miss your dose. Call your care team if you are unable to keep an appointment. What may interact with this medication? Certain antibiotics given by injection Diuretics, such as bumetanide, furosemide NSAIDs, medications for pain and inflammation, such as ibuprofen or naproxen Teriparatide Thalidomide This list may not describe all possible interactions. Give your health care provider a list of all the medicines, herbs, non-prescription drugs, or dietary supplements you use. Also tell them if you smoke, drink alcohol, or use illegal drugs. Some items may interact with your medicine. What should I watch for while using this medication? Visit your care team for regular checks on your progress. It may be some time before you see the benefit from this medication. Some people who take this medication have severe bone, joint, or muscle pain. This medication may also increase your risk for jaw problems or a broken thigh bone. Tell your care team right away if you  have severe pain in your jaw, bones, joints, or muscles. Tell you care team if you have any pain that does not go away or that gets worse. Tell your dentist and dental surgeon that you are taking this medication. You should not have major dental surgery while on this medication. See your dentist to have a dental exam and fix any dental problems before starting this medication. Take good care of your teeth while on this medication. Make sure you see your dentist for regular follow-up appointments. You should make sure you get enough calcium and vitamin D while you are taking this medication. Discuss the foods you eat and the vitamins you take with your care team. Check with your care team if you have severe diarrhea, nausea, and vomiting, or if you sweat a lot. The loss of too much body fluid may make it dangerous for you to take this medication. You may need bloodwork while taking this medication. Talk to your care team if you wish to become pregnant or think you might be pregnant. This medication can cause serious birth defects. What side effects may I notice from receiving this medication? Side effects that you should report to your care team as soon as possible: Allergic reactions--skin rash, itching, hives, swelling of the face, lips, tongue,  or throat Kidney injury--decrease in the amount of urine, swelling of the ankles, hands, or feet Low calcium level--muscle pain or cramps, confusion, tingling, or numbness in the hands or feet Osteonecrosis of the jaw--pain, swelling, or redness in the mouth, numbness of the jaw, poor healing after dental work, unusual discharge from the mouth, visible bones in the mouth Severe bone, joint, or muscle pain Side effects that usually do not require medical attention (report to your care team if they continue or are bothersome): Constipation Fatigue Fever Loss of appetite Nausea Stomach pain This list may not describe all possible side effects. Call your  doctor for medical advice about side effects. You may report side effects to FDA at 1-800-FDA-1088. Where should I keep my medication? This medication is given in a hospital or clinic. It will not be stored at home. NOTE: This sheet is a summary. It may not cover all possible information. If you have questions about this medicine, talk to your doctor, pharmacist, or health care provider.  2023 Elsevier/Gold Standard (2022-01-21 00:00:00)  Fulvestrant injection What is this medication? FULVESTRANT (ful VES trant) blocks the effects of estrogen. It is used to treat breast cancer. This medicine may be used for other purposes; ask your health care provider or pharmacist if you have questions. COMMON BRAND NAME(S): FASLODEX What should I tell my care team before I take this medication? They need to know if you have any of these conditions: bleeding disorders liver disease low blood counts, like low white cell, platelet, or red cell counts an unusual or allergic reaction to fulvestrant, other medicines, foods, dyes, or preservatives pregnant or trying to get pregnant breast-feeding How should I use this medication? This medicine is for injection into a muscle. It is usually given by a health care professional in a hospital or clinic setting. Talk to your pediatrician regarding the use of this medicine in children. Special care may be needed. Overdosage: If you think you have taken too much of this medicine contact a poison control center or emergency room at once. NOTE: This medicine is only for you. Do not share this medicine with others. What if I miss a dose? It is important not to miss your dose. Call your doctor or health care professional if you are unable to keep an appointment. What may interact with this medication? medicines that treat or prevent blood clots like warfarin, enoxaparin, dalteparin, apixaban, dabigatran, and rivaroxaban This list may not describe all possible  interactions. Give your health care provider a list of all the medicines, herbs, non-prescription drugs, or dietary supplements you use. Also tell them if you smoke, drink alcohol, or use illegal drugs. Some items may interact with your medicine. What should I watch for while using this medication? Your condition will be monitored carefully while you are receiving this medicine. You will need important blood work done while you are taking this medicine. Do not become pregnant while taking this medicine or for at least 1 year after stopping it. Women of child-bearing potential will need to have a negative pregnancy test before starting this medicine. Women should inform their doctor if they wish to become pregnant or think they might be pregnant. There is a potential for serious side effects to an unborn child. Men should inform their doctors if they wish to father a child. This medicine may lower sperm counts. Talk to your health care professional or pharmacist for more information. Do not breast-feed an infant while taking this medicine or for  1 year after the last dose. What side effects may I notice from receiving this medication? Side effects that you should report to your doctor or health care professional as soon as possible: allergic reactions like skin rash, itching or hives, swelling of the face, lips, or tongue feeling faint or lightheaded, falls pain, tingling, numbness, or weakness in the legs signs and symptoms of infection like fever or chills; cough; flu-like symptoms; sore throat vaginal bleeding Side effects that usually do not require medical attention (report to your doctor or health care professional if they continue or are bothersome): aches, pains constipation diarrhea headache hot flashes nausea, vomiting pain at site where injected stomach pain This list may not describe all possible side effects. Call your doctor for medical advice about side effects. You may report side  effects to FDA at 1-800-FDA-1088. Where should I keep my medication? This drug is given in a hospital or clinic and will not be stored at home. NOTE: This sheet is a summary. It may not cover all possible information. If you have questions about this medicine, talk to your doctor, pharmacist, or health care provider.  2023 Elsevier/Gold Standard (2018-03-13 00:00:00)

## 2022-06-24 ENCOUNTER — Inpatient Hospital Stay: Payer: Medicare HMO | Attending: Oncology

## 2022-06-24 ENCOUNTER — Inpatient Hospital Stay: Payer: Medicare HMO

## 2022-06-24 VITALS — BP 106/64 | HR 77 | Temp 97.0°F | Resp 18

## 2022-06-24 DIAGNOSIS — Z5112 Encounter for antineoplastic immunotherapy: Secondary | ICD-10-CM | POA: Diagnosis present

## 2022-06-24 DIAGNOSIS — C50919 Malignant neoplasm of unspecified site of unspecified female breast: Secondary | ICD-10-CM

## 2022-06-24 DIAGNOSIS — C50411 Malignant neoplasm of upper-outer quadrant of right female breast: Secondary | ICD-10-CM | POA: Diagnosis present

## 2022-06-24 DIAGNOSIS — Z170 Estrogen receptor positive status [ER+]: Secondary | ICD-10-CM

## 2022-06-24 DIAGNOSIS — Z79899 Other long term (current) drug therapy: Secondary | ICD-10-CM | POA: Diagnosis not present

## 2022-06-24 DIAGNOSIS — C7951 Secondary malignant neoplasm of bone: Secondary | ICD-10-CM | POA: Diagnosis present

## 2022-06-24 LAB — COMPREHENSIVE METABOLIC PANEL
ALT: 17 U/L (ref 0–44)
AST: 28 U/L (ref 15–41)
Albumin: 4 g/dL (ref 3.5–5.0)
Alkaline Phosphatase: 66 U/L (ref 38–126)
Anion gap: 5 (ref 5–15)
BUN: 20 mg/dL (ref 6–20)
CO2: 22 mmol/L (ref 22–32)
Calcium: 8.1 mg/dL — ABNORMAL LOW (ref 8.9–10.3)
Chloride: 111 mmol/L (ref 98–111)
Creatinine, Ser: 0.86 mg/dL (ref 0.44–1.00)
GFR, Estimated: >60 mL/min (ref >60)
Glucose, Bld: 121 mg/dL — ABNORMAL HIGH (ref 70–99)
Potassium: 3.2 mmol/L — ABNORMAL LOW (ref 3.5–5.1)
Sodium: 138 mmol/L (ref 135–145)
Total Bilirubin: 0.2 mg/dL — ABNORMAL LOW (ref 0.3–1.2)
Total Protein: 7 g/dL (ref 6.5–8.1)

## 2022-06-24 LAB — CBC WITH DIFFERENTIAL/PLATELET
Abs Immature Granulocytes: 0.03 10*3/uL (ref 0.00–0.07)
Basophils Absolute: 0.1 10*3/uL (ref 0.0–0.1)
Basophils Relative: 1 %
Eosinophils Absolute: 0.1 10*3/uL (ref 0.0–0.5)
Eosinophils Relative: 1 %
HCT: 31 % — ABNORMAL LOW (ref 36.0–46.0)
Hemoglobin: 10.3 g/dL — ABNORMAL LOW (ref 12.0–15.0)
Immature Granulocytes: 0 %
Lymphocytes Relative: 29 %
Lymphs Abs: 2.2 10*3/uL (ref 0.7–4.0)
MCH: 30 pg (ref 26.0–34.0)
MCHC: 33.2 g/dL (ref 30.0–36.0)
MCV: 90.4 fL (ref 80.0–100.0)
Monocytes Absolute: 0.7 10*3/uL (ref 0.1–1.0)
Monocytes Relative: 9 %
Neutro Abs: 4.6 10*3/uL (ref 1.7–7.7)
Neutrophils Relative %: 60 %
Platelets: 245 10*3/uL (ref 150–400)
RBC: 3.43 MIL/uL — ABNORMAL LOW (ref 3.87–5.11)
RDW: 13.7 % (ref 11.5–15.5)
WBC: 7.7 10*3/uL (ref 4.0–10.5)
nRBC: 0 % (ref 0.0–0.2)

## 2022-06-24 LAB — MAGNESIUM: Magnesium: 2 mg/dL (ref 1.7–2.4)

## 2022-06-24 MED ORDER — ACETAMINOPHEN 325 MG PO TABS
650.0000 mg | ORAL_TABLET | Freq: Once | ORAL | Status: AC
  Administered 2022-06-24: 650 mg via ORAL
  Filled 2022-06-24: qty 2

## 2022-06-24 MED ORDER — SODIUM CHLORIDE 0.9 % IV SOLN
Freq: Once | INTRAVENOUS | Status: AC
  Filled 2022-06-24: qty 250

## 2022-06-24 MED ORDER — TRASTUZUMAB-ANNS CHEMO 150 MG IV SOLR
6.0000 mg/kg | Freq: Once | INTRAVENOUS | Status: AC
  Administered 2022-06-24: 504 mg via INTRAVENOUS
  Filled 2022-06-24: qty 24

## 2022-06-24 MED ORDER — HEPARIN SOD (PORK) LOCK FLUSH 100 UNIT/ML IV SOLN
500.0000 [IU] | Freq: Once | INTRAVENOUS | Status: AC | PRN
  Administered 2022-06-24: 500 [IU]
  Filled 2022-06-24: qty 5

## 2022-06-24 MED ORDER — SODIUM CHLORIDE 0.9 % IV SOLN
420.0000 mg | Freq: Once | INTRAVENOUS | Status: AC
  Administered 2022-06-24: 420 mg via INTRAVENOUS
  Filled 2022-06-24: qty 14

## 2022-06-24 MED ORDER — DIPHENHYDRAMINE HCL 25 MG PO CAPS: 50.0000 mg | ORAL_CAPSULE | Freq: Once | ORAL | Status: DC

## 2022-06-24 NOTE — Patient Instructions (Signed)
[facility] AT [facility]  Discharge Instructions: Thank you for choosing [facility] to provide your oncology and hematology care.  If you have a lab appointment with the Cancer Center, please go directly to the Cancer Center and check in at the registration area.  Wear comfortable clothing and clothing appropriate for easy access to any Portacath or PICC line.   We strive to give you quality time with your provider. You may need to reschedule your appointment if you arrive late (15 or more minutes).  Arriving late affects you and other patients whose appointments are after yours.  Also, if you miss three or more appointments without notifying the office, you may be dismissed from the clinic at the provider's discretion.      For prescription refill requests, have your pharmacy contact our office and allow 72 hours for refills to be completed.    Today you received the following chemotherapy and/or immunotherapy agents Kanjinti and Perjeta        To help prevent nausea and vomiting after your treatment, we encourage you to take your nausea medication as directed.  BELOW ARE SYMPTOMS THAT SHOULD BE REPORTED IMMEDIATELY: *FEVER GREATER THAN 100.4 F (38 C) OR HIGHER *CHILLS OR SWEATING *NAUSEA AND VOMITING THAT IS NOT CONTROLLED WITH YOUR NAUSEA MEDICATION *UNUSUAL SHORTNESS OF BREATH *UNUSUAL BRUISING OR BLEEDING *URINARY PROBLEMS (pain or burning when urinating, or frequent urination) *BOWEL PROBLEMS (unusual diarrhea, constipation, pain near the anus) TENDERNESS IN MOUTH AND THROAT WITH OR WITHOUT PRESENCE OF ULCERS (sore throat, sores in mouth, or a toothache) UNUSUAL RASH, SWELLING OR PAIN  UNUSUAL VAGINAL DISCHARGE OR ITCHING   Items with * indicate a potential emergency and should be followed up as soon as possible or go to the Emergency Department if any problems should occur.  Please show the CHEMOTHERAPY ALERT CARD or IMMUNOTHERAPY ALERT CARD at  check-in to the Emergency Department and triage nurse.  Should you have questions after your visit or need to cancel or reschedule your appointment, please contact [facility]  [phone] and follow the prompts.  Office hours are 8:00 a.m. to 4:30 p.m. Monday - Friday. Please note that voicemails left after 4:00 p.m. may not be returned until the following business day.  We are closed weekends and major holidays. You have access to a nurse at all times for urgent questions. Please call the main number to the clinic [phone] and follow the prompts.  For any non-urgent questions, you may also contact your provider using MyChart. We now offer e-Visits for anyone 18 and older to request care online for non-urgent symptoms. For details visit [email or web address].   Also download the MyChart app! Go to the app store, search "MyChart", open the app, select [facility], and log in with your MyChart username and password.  Masks are optional in the cancer centers. If you would like for your care team to wear a mask while they are taking care of you, please let them know. For doctor visits, patients may have with them one support person who is at least 39 years old. At this time, visitors are not allowed in the infusion area.  

## 2022-07-01 ENCOUNTER — Inpatient Hospital Stay: Payer: Medicare HMO

## 2022-07-04 ENCOUNTER — Other Ambulatory Visit: Payer: Self-pay

## 2022-07-08 ENCOUNTER — Inpatient Hospital Stay: Payer: Medicare HMO

## 2022-07-08 VITALS — BP 109/69 | HR 98 | Temp 97.1°F | Resp 18 | Ht 67.0 in | Wt 175.7 lb

## 2022-07-08 DIAGNOSIS — Z5112 Encounter for antineoplastic immunotherapy: Secondary | ICD-10-CM | POA: Diagnosis not present

## 2022-07-08 DIAGNOSIS — C50919 Malignant neoplasm of unspecified site of unspecified female breast: Secondary | ICD-10-CM

## 2022-07-08 DIAGNOSIS — Z170 Estrogen receptor positive status [ER+]: Secondary | ICD-10-CM

## 2022-07-08 DIAGNOSIS — Z95828 Presence of other vascular implants and grafts: Secondary | ICD-10-CM

## 2022-07-08 LAB — COMPREHENSIVE METABOLIC PANEL
ALT: 19 U/L (ref 0–44)
AST: 29 U/L (ref 15–41)
Albumin: 4.4 g/dL (ref 3.5–5.0)
Alkaline Phosphatase: 66 U/L (ref 38–126)
Anion gap: 8 (ref 5–15)
BUN: 19 mg/dL (ref 6–20)
CO2: 22 mmol/L (ref 22–32)
Calcium: 9.5 mg/dL (ref 8.9–10.3)
Chloride: 110 mmol/L (ref 98–111)
Creatinine, Ser: 0.92 mg/dL (ref 0.44–1.00)
GFR, Estimated: >60 mL/min (ref >60)
Glucose, Bld: 112 mg/dL — ABNORMAL HIGH (ref 70–99)
Potassium: 3.3 mmol/L — ABNORMAL LOW (ref 3.5–5.1)
Sodium: 140 mmol/L (ref 135–145)
Total Bilirubin: 0.5 mg/dL (ref 0.3–1.2)
Total Protein: 7.4 g/dL (ref 6.5–8.1)

## 2022-07-08 LAB — CBC WITH DIFFERENTIAL/PLATELET
Abs Immature Granulocytes: 0.02 10*3/uL (ref 0.00–0.07)
Basophils Absolute: 0 10*3/uL (ref 0.0–0.1)
Basophils Relative: 0 %
Eosinophils Absolute: 0.1 10*3/uL (ref 0.0–0.5)
Eosinophils Relative: 1 %
HCT: 33.4 % — ABNORMAL LOW (ref 36.0–46.0)
Hemoglobin: 11.1 g/dL — ABNORMAL LOW (ref 12.0–15.0)
Immature Granulocytes: 0 %
Lymphocytes Relative: 26 %
Lymphs Abs: 2.4 10*3/uL (ref 0.7–4.0)
MCH: 29.8 pg (ref 26.0–34.0)
MCHC: 33.2 g/dL (ref 30.0–36.0)
MCV: 89.5 fL (ref 80.0–100.0)
Monocytes Absolute: 0.8 10*3/uL (ref 0.1–1.0)
Monocytes Relative: 9 %
Neutro Abs: 5.8 10*3/uL (ref 1.7–7.7)
Neutrophils Relative %: 64 %
Platelets: 279 10*3/uL (ref 150–400)
RBC: 3.73 MIL/uL — ABNORMAL LOW (ref 3.87–5.11)
RDW: 13.7 % (ref 11.5–15.5)
WBC: 9.2 10*3/uL (ref 4.0–10.5)
nRBC: 0 % (ref 0.0–0.2)

## 2022-07-08 MED ORDER — SODIUM CHLORIDE 0.9 % IV SOLN
Freq: Once | INTRAVENOUS | Status: AC
  Filled 2022-07-08: qty 250

## 2022-07-08 MED ORDER — FULVESTRANT 250 MG/5ML IM SOLN: 500.0000 mg | INTRAMUSCULAR | Status: DC

## 2022-07-08 MED ORDER — HEPARIN SOD (PORK) LOCK FLUSH 100 UNIT/ML IV SOLN
500.0000 [IU] | Freq: Once | INTRAVENOUS | Status: AC
  Administered 2022-07-08: 500 [IU] via INTRAVENOUS
  Filled 2022-07-08: qty 5

## 2022-07-08 MED ORDER — FULVESTRANT 250 MG/5ML IM SOSY
500.0000 mg | PREFILLED_SYRINGE | Freq: Once | INTRAMUSCULAR | Status: AC
  Administered 2022-07-08: 500 mg via INTRAMUSCULAR
  Filled 2022-07-08: qty 10

## 2022-07-08 MED ORDER — ZOLEDRONIC ACID 4 MG/100ML IV SOLN
4.0000 mg | Freq: Once | INTRAVENOUS | Status: AC
  Administered 2022-07-08: 4 mg via INTRAVENOUS
  Filled 2022-07-08: qty 100

## 2022-07-08 NOTE — Patient Instructions (Signed)
[facility] AT [place]  Discharge Instructions: Thank you for choosing [facility] to provide your oncology and hematology care.  If you have a lab appointment with the [facility], please go directly to the [facility] and check in at the registration area.  Wear comfortable clothing and clothing appropriate for easy access to any Portacath or PICC line.   We strive to give you quality time with your provider. You may need to reschedule your appointment if you arrive late (15 or more minutes).  Arriving late affects you and other patients whose appointments are after yours.  Also, if you miss three or more appointments without notifying the office, you may be dismissed from the clinic at the provider's discretion.      For prescription refill requests, have your pharmacy contact our office and allow 72 hours for refills to be completed.    Today you received the following chemotherapy and/or immunotherapy agents ZOMETA and FASLODEX      To help prevent nausea and vomiting after your treatment, we encourage you to take your nausea medication as directed.  BELOW ARE SYMPTOMS THAT SHOULD BE REPORTED IMMEDIATELY: *FEVER GREATER THAN 100.4 F (38 C) OR HIGHER *CHILLS OR SWEATING *NAUSEA AND VOMITING THAT IS NOT CONTROLLED WITH YOUR NAUSEA MEDICATION *UNUSUAL SHORTNESS OF BREATH *UNUSUAL BRUISING OR BLEEDING *URINARY PROBLEMS (pain or burning when urinating, or frequent urination) *BOWEL PROBLEMS (unusual diarrhea, constipation, pain near the anus) TENDERNESS IN MOUTH AND THROAT WITH OR WITHOUT PRESENCE OF ULCERS (sore throat, sores in mouth, or a toothache) UNUSUAL RASH, SWELLING OR PAIN  UNUSUAL VAGINAL DISCHARGE OR ITCHING   Items with * indicate a potential emergency and should be followed up as soon as possible or go to the Emergency Department if any problems should occur.  Please show the CHEMOTHERAPY ALERT CARD or IMMUNOTHERAPY ALERT CARD at  check-in to the Emergency Department and triage nurse.  Should you have questions after your visit or need to cancel or reschedule your appointment, please contact [facility] [place] AT [place]  [phone] and follow the prompts.  Office hours are 8:00 a.m. to 4:30 p.m. Monday - Friday. Please note that voicemails left after 4:00 p.m. may not be returned until the following business day.  We are closed weekends and major holidays. You have access to a nurse at all times for urgent questions. Please call the main number to the clinic [phone] and follow the prompts.  For any non-urgent questions, you may also contact your provider using MyChart. We now offer e-Visits for anyone 59 and older to request care online for non-urgent symptoms. For details visit [email or web address].   Also download the MyChart app! Go to the app store, search "MyChart", open the app, select [facility], and log in with your MyChart username and password.  Masks are optional in the cancer centers. If you would like for your care team to wear a mask while they are taking care of you, please let them know. For doctor visits, patients may have with them one support person who is at least 39 years old. At this time, visitors are not allowed in the infusion area.   Zoledronic Acid Injection (Cancer) What is this medication? ZOLEDRONIC ACID (ZOE le dron ik AS id) treats high calcium levels in the blood caused by cancer. It may also be used with chemotherapy to treat weakened bones caused by cancer. It works by slowing down the release of calcium from bones. This lowers  calcium levels in your blood. It also makes your bones stronger and less likely to break (fracture). It belongs to a group of medications called bisphosphonates. This medicine may be used for other purposes; ask your health care provider or pharmacist if you have questions. COMMON BRAND NAME(S): Zometa, Zometa Powder What should I tell my  care team before I take this medication? They need to know if you have any of these conditions: Dehydration Dental disease Kidney disease Liver disease Low levels of calcium in the blood Lung or breathing disease, such as asthma Receiving steroids, such as dexamethasone or prednisone An unusual or allergic reaction to zoledronic acid, other medications, foods, dyes, or preservatives Pregnant or trying to get pregnant Breast-feeding How should I use this medication? This medication is injected into a vein. It is given by your care team in a hospital or clinic setting. Talk to your care team about the use of this medication in children. Special care may be needed. Overdosage: If you think you have taken too much of this medicine contact a poison control center or emergency room at once. NOTE: This medicine is only for you. Do not share this medicine with others. What if I miss a dose? Keep appointments for follow-up doses. It is important not to miss your dose. Call your care team if you are unable to keep an appointment. What may interact with this medication? Certain antibiotics given by injection Diuretics, such as bumetanide, furosemide NSAIDs, medications for pain and inflammation, such as ibuprofen or naproxen Teriparatide Thalidomide This list may not describe all possible interactions. Give your health care provider a list of all the medicines, herbs, non-prescription drugs, or dietary supplements you use. Also tell them if you smoke, drink alcohol, or use illegal drugs. Some items may interact with your medicine. What should I watch for while using this medication? Visit your care team for regular checks on your progress. It may be some time before you see the benefit from this medication. Some people who take this medication have severe bone, joint, or muscle pain. This medication may also increase your risk for jaw problems or a broken thigh bone. Tell your care team right away  if you have severe pain in your jaw, bones, joints, or muscles. Tell you care team if you have any pain that does not go away or that gets worse. Tell your dentist and dental surgeon that you are taking this medication. You should not have major dental surgery while on this medication. See your dentist to have a dental exam and fix any dental problems before starting this medication. Take good care of your teeth while on this medication. Make sure you see your dentist for regular follow-up appointments. You should make sure you get enough calcium and vitamin D while you are taking this medication. Discuss the foods you eat and the vitamins you take with your care team. Check with your care team if you have severe diarrhea, nausea, and vomiting, or if you sweat a lot. The loss of too much body fluid may make it dangerous for you to take this medication. You may need bloodwork while taking this medication. Talk to your care team if you wish to become pregnant or think you might be pregnant. This medication can cause serious birth defects. What side effects may I notice from receiving this medication? Side effects that you should report to your care team as soon as possible: Allergic reactions--skin rash, itching, hives, swelling of the face, lips,  tongue, or throat Kidney injury--decrease in the amount of urine, swelling of the ankles, hands, or feet Low calcium level--muscle pain or cramps, confusion, tingling, or numbness in the hands or feet Osteonecrosis of the jaw--pain, swelling, or redness in the mouth, numbness of the jaw, poor healing after dental work, unusual discharge from the mouth, visible bones in the mouth Severe bone, joint, or muscle pain Side effects that usually do not require medical attention (report to your care team if they continue or are bothersome): Constipation Fatigue Fever Loss of appetite Nausea Stomach pain This list may not describe all possible side effects. Call  your doctor for medical advice about side effects. You may report side effects to FDA at 1-800-FDA-1088. Where should I keep my medication? This medication is given in a hospital or clinic. It will not be stored at home. NOTE: This sheet is a summary. It may not cover all possible information. If you have questions about this medicine, talk to your doctor, pharmacist, or health care provider.  2023 Elsevier/Gold Standard (2022-01-21 00:00:00)  Fulvestrant injection What is this medication? FULVESTRANT (ful VES trant) blocks the effects of estrogen. It is used to treat breast cancer. This medicine may be used for other purposes; ask your health care provider or pharmacist if you have questions. COMMON BRAND NAME(S): FASLODEX What should I tell my care team before I take this medication? They need to know if you have any of these conditions: bleeding disorders liver disease low blood counts, like low white cell, platelet, or red cell counts an unusual or allergic reaction to fulvestrant, other medicines, foods, dyes, or preservatives pregnant or trying to get pregnant breast-feeding How should I use this medication? This medicine is for injection into a muscle. It is usually given by a health care professional in a hospital or clinic setting. Talk to your pediatrician regarding the use of this medicine in children. Special care may be needed. Overdosage: If you think you have taken too much of this medicine contact a poison control center or emergency room at once. NOTE: This medicine is only for you. Do not share this medicine with others. What if I miss a dose? It is important not to miss your dose. Call your doctor or health care professional if you are unable to keep an appointment. What may interact with this medication? medicines that treat or prevent blood clots like warfarin, enoxaparin, dalteparin, apixaban, dabigatran, and rivaroxaban This list may not describe all possible  interactions. Give your health care provider a list of all the medicines, herbs, non-prescription drugs, or dietary supplements you use. Also tell them if you smoke, drink alcohol, or use illegal drugs. Some items may interact with your medicine. What should I watch for while using this medication? Your condition will be monitored carefully while you are receiving this medicine. You will need important blood work done while you are taking this medicine. Do not become pregnant while taking this medicine or for at least 1 year after stopping it. Women of child-bearing potential will need to have a negative pregnancy test before starting this medicine. Women should inform their doctor if they wish to become pregnant or think they might be pregnant. There is a potential for serious side effects to an unborn child. Men should inform their doctors if they wish to father a child. This medicine may lower sperm counts. Talk to your health care professional or pharmacist for more information. Do not breast-feed an infant while taking this medicine or  for 1 year after the last dose. What side effects may I notice from receiving this medication? Side effects that you should report to your doctor or health care professional as soon as possible: allergic reactions like skin rash, itching or hives, swelling of the face, lips, or tongue feeling faint or lightheaded, falls pain, tingling, numbness, or weakness in the legs signs and symptoms of infection like fever or chills; cough; flu-like symptoms; sore throat vaginal bleeding Side effects that usually do not require medical attention (report to your doctor or health care professional if they continue or are bothersome): aches, pains constipation diarrhea headache hot flashes nausea, vomiting pain at site where injected stomach pain This list may not describe all possible side effects. Call your doctor for medical advice about side effects. You may report side  effects to FDA at 1-800-FDA-1088. Where should I keep my medication? This drug is given in a hospital or clinic and will not be stored at home. NOTE: This sheet is a summary. It may not cover all possible information. If you have questions about this medicine, talk to your doctor, pharmacist, or health care provider.  2023 Elsevier/Gold Standard (2018-03-13 00:00:00)

## 2022-07-15 ENCOUNTER — Inpatient Hospital Stay (HOSPITAL_BASED_OUTPATIENT_CLINIC_OR_DEPARTMENT_OTHER): Payer: Medicare HMO | Admitting: Oncology

## 2022-07-15 ENCOUNTER — Inpatient Hospital Stay: Payer: Medicare HMO | Attending: Oncology

## 2022-07-15 ENCOUNTER — Inpatient Hospital Stay: Payer: Medicare HMO

## 2022-07-15 ENCOUNTER — Encounter: Payer: Self-pay | Admitting: Oncology

## 2022-07-15 VITALS — BP 97/65 | HR 90 | Temp 97.1°F | Resp 16 | Wt 174.5 lb

## 2022-07-15 DIAGNOSIS — Z79899 Other long term (current) drug therapy: Secondary | ICD-10-CM | POA: Insufficient documentation

## 2022-07-15 DIAGNOSIS — M899 Disorder of bone, unspecified: Secondary | ICD-10-CM

## 2022-07-15 DIAGNOSIS — G620 Drug-induced polyneuropathy: Secondary | ICD-10-CM

## 2022-07-15 DIAGNOSIS — Z170 Estrogen receptor positive status [ER+]: Secondary | ICD-10-CM

## 2022-07-15 DIAGNOSIS — Z5112 Encounter for antineoplastic immunotherapy: Secondary | ICD-10-CM | POA: Diagnosis present

## 2022-07-15 DIAGNOSIS — Z5111 Encounter for antineoplastic chemotherapy: Secondary | ICD-10-CM

## 2022-07-15 DIAGNOSIS — C50411 Malignant neoplasm of upper-outer quadrant of right female breast: Secondary | ICD-10-CM

## 2022-07-15 DIAGNOSIS — C7951 Secondary malignant neoplasm of bone: Secondary | ICD-10-CM | POA: Insufficient documentation

## 2022-07-15 DIAGNOSIS — T451X5A Adverse effect of antineoplastic and immunosuppressive drugs, initial encounter: Secondary | ICD-10-CM

## 2022-07-15 DIAGNOSIS — C50919 Malignant neoplasm of unspecified site of unspecified female breast: Secondary | ICD-10-CM

## 2022-07-15 LAB — COMPREHENSIVE METABOLIC PANEL
ALT: 20 U/L (ref 0–44)
AST: 31 U/L (ref 15–41)
Albumin: 4.1 g/dL (ref 3.5–5.0)
Alkaline Phosphatase: 67 U/L (ref 38–126)
Anion gap: 8 (ref 5–15)
BUN: 18 mg/dL (ref 6–20)
CO2: 22 mmol/L (ref 22–32)
Calcium: 9.1 mg/dL (ref 8.9–10.3)
Chloride: 109 mmol/L (ref 98–111)
Creatinine, Ser: 0.9 mg/dL (ref 0.44–1.00)
GFR, Estimated: >60 mL/min (ref >60)
Glucose, Bld: 120 mg/dL — ABNORMAL HIGH (ref 70–99)
Potassium: 3.5 mmol/L (ref 3.5–5.1)
Sodium: 139 mmol/L (ref 135–145)
Total Bilirubin: 0.2 mg/dL — ABNORMAL LOW (ref 0.3–1.2)
Total Protein: 7.3 g/dL (ref 6.5–8.1)

## 2022-07-15 LAB — CBC WITH DIFFERENTIAL/PLATELET
Abs Immature Granulocytes: 0.01 10*3/uL (ref 0.00–0.07)
Basophils Absolute: 0 10*3/uL (ref 0.0–0.1)
Basophils Relative: 0 %
Eosinophils Absolute: 0.1 10*3/uL (ref 0.0–0.5)
Eosinophils Relative: 1 %
HCT: 31.8 % — ABNORMAL LOW (ref 36.0–46.0)
Hemoglobin: 10.7 g/dL — ABNORMAL LOW (ref 12.0–15.0)
Immature Granulocytes: 0 %
Lymphocytes Relative: 20 %
Lymphs Abs: 1.8 10*3/uL (ref 0.7–4.0)
MCH: 30.2 pg (ref 26.0–34.0)
MCHC: 33.6 g/dL (ref 30.0–36.0)
MCV: 89.8 fL (ref 80.0–100.0)
Monocytes Absolute: 0.7 10*3/uL (ref 0.1–1.0)
Monocytes Relative: 8 %
Neutro Abs: 6.6 10*3/uL (ref 1.7–7.7)
Neutrophils Relative %: 71 %
Platelets: 261 10*3/uL (ref 150–400)
RBC: 3.54 MIL/uL — ABNORMAL LOW (ref 3.87–5.11)
RDW: 13.6 % (ref 11.5–15.5)
WBC: 9.2 10*3/uL (ref 4.0–10.5)
nRBC: 0 % (ref 0.0–0.2)

## 2022-07-15 LAB — MAGNESIUM: Magnesium: 2.1 mg/dL (ref 1.7–2.4)

## 2022-07-15 MED ORDER — SODIUM CHLORIDE 0.9 % IV SOLN
420.0000 mg | Freq: Once | INTRAVENOUS | Status: AC
  Administered 2022-07-15: 420 mg via INTRAVENOUS
  Filled 2022-07-15: qty 14

## 2022-07-15 MED ORDER — SODIUM CHLORIDE 0.9 % IV SOLN
Freq: Once | INTRAVENOUS | Status: AC
  Filled 2022-07-15: qty 250

## 2022-07-15 MED ORDER — TRASTUZUMAB-ANNS CHEMO 150 MG IV SOLR
6.0000 mg/kg | Freq: Once | INTRAVENOUS | Status: AC
  Administered 2022-07-15: 504 mg via INTRAVENOUS
  Filled 2022-07-15: qty 24

## 2022-07-15 MED ORDER — HEPARIN SOD (PORK) LOCK FLUSH 100 UNIT/ML IV SOLN
500.0000 [IU] | Freq: Once | INTRAVENOUS | Status: AC | PRN
  Administered 2022-07-15: 500 [IU]
  Filled 2022-07-15: qty 5

## 2022-07-15 MED ORDER — ACETAMINOPHEN 325 MG PO TABS
650.0000 mg | ORAL_TABLET | Freq: Once | ORAL | Status: AC
  Administered 2022-07-15: 650 mg via ORAL
  Filled 2022-07-15: qty 2

## 2022-07-15 MED ORDER — SODIUM CHLORIDE 0.9% FLUSH
10.0000 mL | Freq: Once | INTRAVENOUS | Status: AC
  Administered 2022-07-15: 10 mL via INTRAVENOUS
  Filled 2022-07-15: qty 10

## 2022-07-15 MED ORDER — DIPHENHYDRAMINE HCL 25 MG PO CAPS: 50.0000 mg | ORAL_CAPSULE | Freq: Once | ORAL | Status: DC

## 2022-07-15 NOTE — Assessment & Plan Note (Addendum)
status post radiation. Continue Zometa monthly. Continue calcium supplementation. Continue oxycodone as needed.  last PET scan showed no evidence of local recurrence, distant metastasis disease.

## 2022-07-15 NOTE — Progress Notes (Signed)
Hematology/Oncology Progress note [phone] [phone]     Patient Care Team: Marguerita Merles, MD as PCP - General (Family Medicine) End, Harrell Gave, MD as PCP - Cardiology (Cardiology) Rico Junker, RN as Oncology Nurse Navigator Earlie Server, MD as Consulting Physician (Oncology) Bary Castilla, Forest Gleason, MD (General Surgery) Noreene Filbert, MD as Referring Physician (Radiation Oncology) Noreene Filbert, MD as Radiation Oncologist (Radiation Oncology) Noreene Filbert, MD as Radiation Oncologist (Radiation Oncology)   Name of the patient: Theresa Chaney  [id number]  1981/04/27   Date of visit: 07/15/22   ASSESSMENT & PLAN:   Cancer Staging  Breast cancer of upper-outer quadrant of right female breast [facility]) Staging form: Breast, AJCC 8th Edition - Clinical: G2, ER+, PR+, HER2- - Signed by Earlie Server, MD on 04/05/2018 - Pathologic stage from 04/04/2018: No Stage Recommended (ypT3, pN2, cM0, G3, ER+, PR-, HER2-) - Signed by Earlie Server, MD on 04/04/2018 - Pathologic: Stage IV (rpTX, pNX, cM1, ER+, PR-, HER2+) - Signed by Earlie Server, MD on 12/10/2020   Breast cancer of upper-outer quadrant of right female breast ([place]) Recurrent breast cancer with isolated bone metastasis, stage IV #Initially stage IIIA right breast cancer, ER positive, HER-2 negative status post bilateral oophorectomy,Right mastectomy and right axillary lymph node dissection, status post implant, and implant removal.  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 Estrogen positive, HER-2 positive breast cancer.- s/p Palliative radiation to T7  Labs reviewed and discussed Proceed with Kanjinti Perjecta.   Continue fulvestrant monthly.  Neuropathy due to chemotherapeutic drug ([place]) continue Lyrica and nortriptyline symptoms stable  Encounter for monoclonal antibody treatment for malignancy Antibody treatment plan as list  above.  Bone metastasis status post radiation. Continue Zometa monthly.  Continue oxycodone as needed.   last PET scan showed no evidence of local recurrence, distant metastasis disease.  Orders Placed This Encounter  Procedures   Basic metabolic panel    Draw BMP prior to Zometa    Standing Status:   Standing    Number of Occurrences:   40    Standing Expiration Date:   07/16/2023   lab [place] 3 weeks.  lab MD Kanjinti Perjecta 6 weeks.  fulvestrant/Zometa Q 4 weeks   All questions were answered. The patient knows to call the clinic with any problems, questions or concerns.  Earlie Server, MD, PhD [facility] Hematology Oncology 07/15/2022      REASON FOR VISIT Follow up for  treatment of breast cancer  Oncology History Cancer TREATMENT Neoadjuvant ddAC +one dose of Taxol, due to lack of response, surgery was offered. Case was discussed on breast tumor board. 03/19/2018 S/p right mastectomy and right axillary dissection, immediate breast reconstruction with placement of expanders.  ypT3 ypN2, + lymphovascular invasion,  Grade 3, margin is negative, close. ER 90%, PR 0%, HER2 IHC negative.  Also had elective bilateral salpingo-oophorectomy..  # 06/11/2018 s/p 11 cycles Taxol adjuvantly. Tolerated well.  # She has obtained dental clearance for starting Zometa.  S/p Zometa on 6/3/ 2019 # s/p adjuvant right chest wall radiation, finished 10/10/2018 #06/03/2019 underwent elective left prophylactic mastectomy and sentinel lymph node biopsy of left axilla.  # 06/03/2019 underwent elective left prophylactic mastectomy and sentinel lymph node biopsy of left axilla. Pathology negative for malignancy. Status post Mediport removal on 06/03/2019. She also underwent right implant removal on 06/03/2019. She also underwent right implant removal on 06/03/2019. #Negative genetic testing  #  chemotherapy-induced neuropathy, bilateral  fingertips and lower extremities.  Patient is on Lyrica and  nortriptyline.  Follows up with neurology. She follows up with lymphedema clinic.  #  07/07/2020, MRI thoracic spine without contrast showed lesions involving the T7 posterior elements most concerning for metastatic lesion.  No evidence of epidural tumor.  Minimal thoracic spondylosis without stenosis. MRI was reviewed by me and a PET scan was obtained for further evaluation. 07/20/2020, PET scan showed hypermetabolic metastasis involving the posterior element of T7, no additional evidence of metastasis in the neck, chest, abdomen or pelvis.  # 07/29/2020 T7 lesion biopsy showed metastatic carcinoma, compatible with breast origin.  Receptor status staining showed ER 71-80% positive, PR negative, HER-2 positive IHC 3+ # Patient finished spine radiation on 08/31/2020   positive for COVID-19 on 01/11/2021  # 03/15/2021, PET scan showed no focal hypermetabolic activity to suggest skeletal metastasis.  Mild hypermetabolic activity along the right T7-8 paraspinal musculature.  Max SUV 3.3.  Likely postprocedural-  #Lower extremity cramps when she eats banana or oral potassium supplementation.not able to tolerate po potassium.   # 09/28/2021, echocardiogram showed further decrease of LVEF to 45% #11/12/21 echocardiogram showed LVEF of 45-50%. # 02/25/2022, echocardiogram showed LVEF 55-60%.  INTERVAL HISTORY 41 yo female with above oncology history reviewed by me presents for follow-up of management of metastatic breast cancer.  Patient reports feeling well.  She has no new complaint Chronic back pain, unchanged.   Intermittent diarrhea, manageable.  She has no new complaints.  BP is soft today in the clinic. She has held her losartan.  She tries to drink more water.  Did not sleep well last night.     Review of Systems  Constitutional:  Negative for chills, fever, malaise/fatigue and weight loss.  HENT:  Negative for sore throat.   Eyes:  Negative for redness.  Respiratory:  Negative for cough,  shortness of breath and wheezing.   Cardiovascular:  Negative for chest pain, palpitations and leg swelling.  Gastrointestinal:  Negative for abdominal pain, blood in stool, heartburn, nausea and vomiting.  Genitourinary:  Negative for dysuria.  Musculoskeletal:  Positive for back pain and joint pain. Negative for myalgias.  Skin:  Negative for rash.  Neurological:  Positive for tingling. Negative for dizziness and tremors.  Endo/Heme/Allergies:  Does not bruise/bleed easily.  Psychiatric/Behavioral:  Negative for hallucinations. The patient has insomnia.     No Known Allergies  Patient Active Problem List   Diagnosis Date Noted   Breast cancer of upper-outer quadrant of right female breast ([place]) 03/19/2018    Priority: High   Thyroid nodule 06/04/2022    Priority: Medium    Hypokalemia 06/03/2022    Priority: Medium    Bone metastasis 03/25/2021    Priority: Medium    Encounter for monoclonal antibody treatment for malignancy 12/10/2020    Priority: Medium    Hypocalcemia 11/19/2020    Priority: Medium    Encounter for antineoplastic chemotherapy 10/29/2020    Priority: Medium    Neuropathy due to chemotherapeutic drug ([place]) 08/11/2020    Priority: Medium    Rheumatoid arteritis ([place]) 10/13/2019    Priority: Medium    Nonischemic cardiomyopathy ([place]) 08/23/2018    Priority: Medium    Goals of care, counseling/discussion 08/11/2020    Priority: Low   Tachycardia, unspecified 04/15/2021   Bone lesion 11/19/2020   Inflammatory arthritis 11/19/2020   HER2-positive carcinoma of breast ([place]) 08/11/2020   Bilateral hand swelling 10/03/2019   Fracture of neck of metacarpal bone 05/14/2019  Chronic fatigue 04/09/2019   Polyarthralgia 04/09/2019   Status post right breast reconstruction 02/26/2019   Status post right mastectomy 02/26/2019   Mastalgia 02/15/2019   Shortness of breath 08/23/2018   Preprocedural cardiovascular examination 08/23/2018   Tachycardia 08/23/2018    Palpitations 08/23/2018   Estrogen receptor positive status (ER+) 04/04/2018   Acquired absence of right breast and nipple 04/03/2018   Family history of cancer    Malignant neoplasm of overlapping sites of right breast in female, estrogen receptor positive ([place]) 10/19/2017   Gastroesophageal reflux disease without esophagitis 02/24/2017   Generalized anxiety disorder 10/03/2014   Headache 10/03/2014     Past Medical History:  Diagnosis Date   Anemia    Arthritis    BRCA negative 11/26/2017   Breast cancer ([place]) 10/11/2017   Multifocal, ER positive, PR negative, HER-2 negative. ypT3 ypN2a 8.7 cm, 4/15 nodes   Cardiomyopathy ([place])    a. 10/2017 Echo: EF 60-65%, no rwma, Gr1 DD, nl RV size/fxn; b. 04/2018 Echo: EF 55-60%, no rwma, Nl RV size/fxn; c. 08/2018 Echo: EF 45%, diff HK, ? HK of antsept wall. Gr1 DD. Mild MR. Mild LAE/RAE. Mod dil RV.    Chronic bronchitis ([place]) 11/2017   COPD (chronic obstructive pulmonary disease) (HCC)    MILD PER CXR   Depression    Family history of cancer    GERD (gastroesophageal reflux disease)    Headache    MIGRAINES   Heart murmur    ASYMPTOMATIC   Personal history of chemotherapy    current for right breast ca     Past Surgical History:  Procedure Laterality Date   AXILLARY LYMPH NODE DISSECTION Right 03/19/2018   Procedure: AXILLARY LYMPH NODE DISSECTION;  Surgeon: Robert Bellow, MD;  Location: [facility];  Service: General;  Laterality: Right;   BREAST BIOPSY Right 10/11/2017   12:30 posterior coil clip invasive mammary carcinoma   BREAST BIOPSY Right 10/11/2017   11:30 middle depth ribbon clip DCIS   BREAST BIOPSY Right 10/11/2017   5:30 anterior depth x shape invasive ductal carcinoma   BREAST IMPLANT REMOVAL Right 06/03/2019   Procedure: REMOVAL OF RIGHT BREAST IMPLANTS;  Surgeon: Wallace Going, DO;  Location: [facility];  Service: Plastics;  Laterality: Right;   BREAST RECONSTRUCTION WITH PLACEMENT OF TISSUE EXPANDER AND FLEX  HD (ACELLULAR HYDRATED DERMIS) Right 03/19/2018   Procedure: BREAST RECONSTRUCTION WITH PLACEMENT OF TISSUE EXPANDER AND FLEX HD (ACELLULAR HYDRATED DERMIS);  Surgeon: Wallace Going, DO;  Location: [facility];  Service: Plastics;  Laterality: Right;   CARPAL TUNNEL RELEASE Bilateral 2020   CHOLECYSTECTOMY N/A 04/27/2020   Procedure: LAPAROSCOPIC CHOLECYSTECTOMY WITH INTRAOPERATIVE CHOLANGIOGRAM;  Surgeon: Robert Bellow, MD;  Location: [facility];  Service: General;  Laterality: N/A;   ESOPHAGOGASTRODUODENOSCOPY (EGD) WITH PROPOFOL N/A 04/17/2020   Procedure: ESOPHAGOGASTRODUODENOSCOPY (EGD) WITH PROPOFOL;  Surgeon: Robert Bellow, MD;  Location: [facility];  Service: Endoscopy;  Laterality: N/A;  with biopsy   LAPAROSCOPIC BILATERAL SALPINGO OOPHERECTOMY Bilateral 03/19/2018   Procedure: LAPAROSCOPIC BILATERAL SALPINGO OOPHORECTOMY;  Surgeon: Benjaman Kindler, MD;  Location: [facility];  Service: Gynecology;  Laterality: Bilateral;   MASTECTOMY Right 03/2018   MASTECTOMY W/ SENTINEL NODE BIOPSY Right 03/19/2018   Procedure: MASTECTOMY WITH SENTINEL LYMPH NODE BIOPSY;  Surgeon: Robert Bellow, MD;  Location: [facility];  Service: General;  Laterality: Right;   PORT-A-CATH REMOVAL Left 06/03/2019   Procedure: REMOVAL PORT-A-CATH;  Surgeon: Robert Bellow, MD;  Location: [facility];  Service: General;  Laterality: Left;   PORTACATH PLACEMENT Left 10/24/2017   Procedure: INSERTION PORT-A-CATH;  Surgeon: Robert Bellow, MD;  Location: [facility];  Service: General;  Laterality: Left;   PORTACATH PLACEMENT Right 09/28/2020   Procedure: INSERTION PORT-A-CATH;  Surgeon: Robert Bellow, MD;  Location: [facility];  Service: General;  Laterality: Right;   REMOVAL OF TISSUE EXPANDER AND PLACEMENT OF IMPLANT Right 07/20/2018   Procedure: REMOVAL OF RIGHT BREAST TISSUE EXPANDER AND PLACEMENT OF IMPLANT;  Surgeon: Wallace Going, DO;  Location: [facility];  Service: Plastics;   Laterality: Right;   SIMPLE MASTECTOMY WITH AXILLARY SENTINEL NODE BIOPSY Left 06/03/2019   Procedure: SIMPLE MASTECTOMY LEFT;  Surgeon: Robert Bellow, MD;  Location: [facility];  Service: General;  Laterality: Left;    Social History   Socioeconomic History   Marital status: Married    Spouse name: Not on file   Number of children: Not on file   Years of education: Not on file   Highest education level: Not on file  Occupational History   Occupation: pharmacy tech    Comment: [employer]   Tobacco Use   Smoking status: Every Day    Packs/day: 0.50    Years: 18.00    Total pack years: 9.00    Types: Cigarettes    Start date: 06/21/2018   Smokeless tobacco: Never  Vaping Use   Vaping Use: Never used  Substance and Sexual Activity   Alcohol use: No   Drug use: No   Sexual activity: Yes    Birth control/protection: Injection, Other-see comments    Comment: has had hysterectomy  Other Topics Concern   Not on file  Social History Narrative   Lives at home with husband and daughter   Social Determinants of Health   Financial Resource Strain: Not on file  Food Insecurity: Not on file  Transportation Needs: Not on file  Physical Activity: Not on file  Stress: Not on file  Social Connections: Not on file  Intimate Partner Violence: Not on file     Family History  Problem Relation Age of Onset   Diabetes Father    Hypertension Father    Hyperlipidemia Father    Heart attack Father 26       "mild"   Melanoma Maternal Aunt        other aunts with BCC/SCC/Melanoma   Cervical cancer Maternal Aunt 79       daughter w/ cervical cancer as well   Lung cancer Maternal Aunt    Melanoma Maternal Uncle        other uncles with BCC/SCC/Melanoma   Breast cancer Paternal Aunt    Bladder Cancer Maternal Grandmother   Biological mother had Grave's disease.    Current Outpatient Medications:    acetaminophen (TYLENOL) 500 MG tablet, Take 500 mg by  mouth every 6 (six) hours as needed., Disp: , Rfl:    albuterol (VENTOLIN HFA) 108 (90 Base) MCG/ACT inhaler, Inhale 2 puffs into the lungs every 6 (six) hours as needed for wheezing or shortness of breath. , Disp: , Rfl:    CALCIUM CARBONATE-VITAMIN D PO, Take 600-800 mg by mouth in the morning, at noon, and at bedtime., Disp: , Rfl:    chlorhexidine (PERIDEX) 0.12 % solution, Use as directed 15 mLs in the mouth or throat 2 (two) times daily., Disp: 120 mL, Rfl: 0   cyanocobalamin (,VITAMIN B-12,) 1000 MCG/ML injection, Inject 1,000 mcg into the skin every  30 (thirty) days., Disp: , Rfl:    cyclobenzaprine (FLEXERIL) 5 MG tablet, Take 5 mg by mouth 3 (three) times daily as needed for muscle spasms., Disp: , Rfl:    diphenoxylate-atropine (LOMOTIL) 2.5-0.025 MG tablet, Take 1 tablet by mouth 4 (four) times daily as needed for diarrhea or loose stools., Disp: 60 tablet, Rfl: 0   escitalopram (LEXAPRO) 20 MG tablet, Take 20 mg by mouth daily., Disp: , Rfl:    esomeprazole (NEXIUM) 40 MG capsule, Take 40 mg by mouth daily before breakfast. , Disp: , Rfl:    Eszopiclone 3 MG TABS, Take 3 mg by mouth at bedtime as needed (sleep). , Disp: , Rfl:    folic acid (FOLVITE) 1 MG tablet, Take 1 mg by mouth daily., Disp: , Rfl:    hydroxychloroquine (PLAQUENIL) 200 MG tablet, Take 200 mg by mouth daily., Disp: , Rfl:    ibuprofen (ADVIL) 800 MG tablet, Take 800 mg by mouth every 8 (eight) hours as needed for moderate pain., Disp: , Rfl:    loperamide (IMODIUM) 2 MG capsule, Take 2 tablets with onset of diarrhea, then take 1 tablet every 2 hours until diarrhea stops. Maximum 8 tablets in 24hours, Disp: 30 capsule, Rfl: 3   loratadine (CLARITIN) 10 MG tablet, Take 10 mg by mouth daily. , Disp: , Rfl:    LORazepam (ATIVAN) 1 MG tablet, Take 1 mg by mouth 3 (three) times daily., Disp: , Rfl:    Magnesium 500 MG TABS, Take 500 mg by mouth 2 (two) times daily., Disp: , Rfl:    methotrexate (RHEUMATREX) 2.5 MG tablet,  Take 25 mg by mouth every Sunday. 10 tablets once a week, Disp: , Rfl:    metoprolol succinate (TOPROL XL) 25 MG 24 hr tablet, Take 0.5 tablets (12.5 mg total) by mouth daily., Disp: 45 tablet, Rfl: 3   mupirocin ointment (BACTROBAN) 2 %, Place 1 application into the nose 2 (two) times daily. Use in each nostril twice daily for five (5) days., Disp: 22 g, Rfl: 5   nortriptyline (PAMELOR) 10 MG capsule, Take 30 mg by mouth at bedtime. , Disp: , Rfl:    oxyCODONE (OXY IR/ROXICODONE) 5 MG immediate release tablet, Take 1 tablet (5 mg total) by mouth every 6 (six) hours as needed for moderate pain or severe pain., Disp: 90 tablet, Rfl: 0   phentermine (ADIPEX-P) 37.5 MG tablet, Take 37.5 mg by mouth daily before breakfast. , Disp: , Rfl:    pregabalin (LYRICA) 150 MG capsule, Take 150 mg by mouth 2 (two) times daily., Disp: , Rfl:    promethazine (PHENERGAN) 25 MG tablet, Take 1 tablet (25 mg total) by mouth every 8 (eight) hours as needed for nausea or vomiting., Disp: 90 tablet, Rfl: 0   pyridOXINE (VITAMIN B-6) 100 MG tablet, Take 100 mg by mouth daily., Disp: , Rfl:    topiramate (TOPAMAX) 50 MG tablet, Take 50 mg by mouth 2 (two) times daily. , Disp: , Rfl:    vitamin C (ASCORBIC ACID) 500 MG tablet, Take 500 mg by mouth daily., Disp: , Rfl:    lidocaine-prilocaine (EMLA) cream, Apply to port and cover 1-2 hours prior to appointment (Patient not taking: Reported on 03/04/2022), Disp: 30 g, Rfl: 3   losartan (COZAAR) 25 MG tablet, Take 0.5 tablets (12.5 mg total) by mouth daily. (Patient not taking: Reported on 07/15/2022), Disp: 45 tablet, Rfl: 3 No current facility-administered medications for this visit.  Facility-Administered Medications Ordered in Other Visits:  diphenhydrAMINE (BENADRYL) capsule 50 mg, 50 mg, Oral, Once, Earlie Server, MD   heparin lock flush 100 unit/mL, 500 Units, Intravenous, Once, Earlie Server, MD   heparin lock flush 100 unit/mL, 500 Units, Intracatheter, Once PRN, Earlie Server, MD    pertuzumab (PERJETA) 420 mg in sodium chloride 0.9 % 250 mL chemo infusion, 420 mg, Intravenous, Once, Earlie Server, MD, Last Rate: 528 mL/hr at 07/15/22 1053, 420 mg at 07/15/22 1053   sodium chloride flush (NS) 0.9 % injection 10 mL, 10 mL, Intravenous, PRN, Earlie Server, MD, 10 mL at 11/19/18 1249   sodium chloride flush (NS) 0.9 % injection 10 mL, 10 mL, Intravenous, Once, Earlie Server, MD   Physical exam:  Vitals:   07/15/22 0842  BP: 97/65  Pulse: 90  Resp: 16  Temp: (!) 97.1 F (36.2 C)  TempSrc: Tympanic  SpO2: 100%  Weight: 174 lb 8 oz (79.2 kg)  ECOG 1 Physical Exam Constitutional:      General: She is not in acute distress.    Appearance: She is not diaphoretic.  HENT:     Head: Normocephalic and atraumatic.     Nose: Nose normal.     Mouth/Throat:     Pharynx: No oropharyngeal exudate.  Eyes:     General: No scleral icterus.    Pupils: Pupils are equal, round, and reactive to light.  Neck:     Vascular: No JVD.  Cardiovascular:     Rate and Rhythm: Normal rate and regular rhythm.     Heart sounds: Normal heart sounds. No murmur heard. Pulmonary:     Effort: Pulmonary effort is normal. No respiratory distress.     Breath sounds: Normal breath sounds. No wheezing or rales.  Chest:     Chest wall: No tenderness.  Abdominal:     General: Bowel sounds are normal. There is no distension.     Palpations: Abdomen is soft. There is no mass.     Tenderness: There is no abdominal tenderness. There is no rebound.  Musculoskeletal:        General: Normal range of motion.     Cervical back: Normal range of motion and neck supple.  Lymphadenopathy:     Cervical: No cervical adenopathy.  Skin:    General: Skin is warm and dry.     Findings: No erythema or rash.  Neurological:     Mental Status: She is alert and oriented to person, place, and time.     Cranial Nerves: No cranial nerve deficit.     Motor: No abnormal muscle tone.     Coordination: Coordination normal.   Psychiatric:        Mood and Affect: Affect normal.        Labs     Latest Ref Rng & Units 07/15/2022    8:14 AM  CMP  Glucose 70 - 99 mg/dL 120   BUN 6 - 20 mg/dL 18   Creatinine 0.44 - 1.00 mg/dL 0.90   Sodium 135 - 145 mmol/L 139   Potassium 3.5 - 5.1 mmol/L 3.5   Chloride 98 - 111 mmol/L 109   CO2 22 - 32 mmol/L 22   Calcium 8.9 - 10.3 mg/dL 9.1   Total Protein 6.5 - 8.1 g/dL 7.3   Total Bilirubin 0.3 - 1.2 mg/dL 0.2   Alkaline Phos 38 - 126 U/L 67   AST 15 - 41 U/L 31   ALT 0 - 44 U/L 20  Latest Ref Rng & Units 07/15/2022    8:14 AM  CBC  WBC 4.0 - 10.5 K/uL 9.2   Hemoglobin 12.0 - 15.0 g/dL 10.7   Hematocrit 36.0 - 46.0 % 31.8   Platelets 150 - 400 K/uL 261    RADIOGRAPHIC STUDIES: I have personally reviewed the radiological images as listed and agreed with the findings in the report. ECHOCARDIOGRAM LIMITED  Result Date: 05/17/2022    ECHOCARDIOGRAM LIMITED REPORT   Patient Name:   Theresa Chaney Date of Exam: 05/17/2022 Medical Rec #:  [id number]      Height:       67.0 in Accession #:    [id number]     Weight:       182.0 lb Date of Birth:  1981-08-19      BSA:          1.943 m Patient Age:    41 years       BP:           120/70 mmHg Patient Gender: F              HR:           91 bpm. Exam Location:  [place] Procedure: Limited Echo, Limited Color Doppler and Strain Analysis Indications:    I42.7,T45.1X5A (ICD-10-CM) - Cardiomyopathy due to chemotherapy  History:        Patient has prior history of Echocardiogram examinations, most                 recent 02/25/2022. Risk Factors:Current Smoker. Breast cancer of                 upper-outer quadrant of right female breast.  Sonographer:    Wilkie Aye RVT Referring Phys: [id number] [place]  1. Left ventricular ejection fraction, by estimation, is 50 to 55%. The left ventricle has low normal function. The left ventricle has no regional wall motion abnormalities. The average left ventricular global  longitudinal strain is -11.7 %.  2. Right ventricular systolic function is normal. The right ventricular size is normal.  3. The mitral valve is normal in structure. No evidence of mitral valve regurgitation. No evidence of mitral stenosis.  4. The aortic valve is normal in structure. Aortic valve regurgitation is not visualized. No aortic stenosis is present.  5. The inferior vena cava is normal in size with greater than 50% respiratory variability, suggesting right atrial pressure of 3 mmHg. Comparison(s): 02/25/2022-EF 60%-65%. FINDINGS  Left Ventricle: Left ventricular ejection fraction, by estimation, is 50 to 55%. The left ventricle has low normal function. The left ventricle has no regional wall motion abnormalities. The average left ventricular global longitudinal strain is -11.7 %. The left ventricular internal cavity size was normal in size. There is no left ventricular hypertrophy. Right Ventricle: The right ventricular size is normal. No increase in right ventricular wall thickness. Right ventricular systolic function is normal. Left Atrium: Left atrial size was normal in size. Right Atrium: Right atrial size was normal in size. Pericardium: There is no evidence of pericardial effusion. Mitral Valve: The mitral valve is normal in structure. No evidence of mitral valve stenosis. Tricuspid Valve: The tricuspid valve is normal in structure. Tricuspid valve regurgitation is not demonstrated. No evidence of tricuspid stenosis. Aortic Valve: The aortic valve is normal in structure. Aortic valve regurgitation is not visualized. No aortic stenosis is present. Pulmonic Valve: The pulmonic valve was normal in structure. Pulmonic valve regurgitation is not visualized.  No evidence of pulmonic stenosis. Aorta: The aortic root is normal in size and structure. Venous: The inferior vena cava is normal in size with greater than 50% respiratory variability, suggesting right atrial pressure of 3 mmHg. IAS/Shunts: No atrial  level shunt detected by color flow Doppler. LEFT VENTRICLE PLAX 2D LVIDd:         4.70 cm LVIDs:         3.30 cm     2D Longitudinal Strain LV PW:         1.00 cm     2D Strain GLS Avg:     -11.7 % LV IVS:        0.80 cm  LV Volumes (MOD) LV vol d, MOD A2C: 86.2 ml LV vol d, MOD A4C: 95.5 ml LV vol s, MOD A2C: 46.9 ml LV vol s, MOD A4C: 44.7 ml LV SV MOD A2C:     39.3 ml LV SV MOD A4C:     95.5 ml LV SV MOD BP:      46.0 ml RIGHT VENTRICLE TAPSE (M-mode): 1.6 cm LEFT ATRIUM             Index LA diam:        3.00 cm 1.54 cm/m LA Vol (A2C):   36.3 ml 18.67 ml/m LA Vol (A4C):   26.7 ml 13.74 ml/m LA Biplane Vol: 35.8 ml 18.43 ml/m Ida Rogue MD Electronically signed by Ida Rogue MD Signature Date/Time: 05/17/2022/7:33:23 PM    Final

## 2022-07-15 NOTE — Assessment & Plan Note (Signed)
continue Lyrica and nortriptyline symptoms stable 

## 2022-07-15 NOTE — Assessment & Plan Note (Deleted)
Antibody treatment plan as list above. 

## 2022-07-15 NOTE — Assessment & Plan Note (Signed)
Antibody treatment plan as list above. 

## 2022-07-15 NOTE — Progress Notes (Signed)
Pt in for follow up, denies any concerns today. Pt reports losartan on hold.

## 2022-07-15 NOTE — Patient Instructions (Signed)
[facility] CANCER CTR AT [place]  Discharge Instructions: Thank you for choosing [facility] to provide your oncology and hematology care.  If you have a lab appointment with the [facility], please go directly to the [facility] and check in at the registration area.  Wear comfortable clothing and clothing appropriate for easy access to any Portacath or PICC line.   We strive to give you quality time with your provider. You may need to reschedule your appointment if you arrive late (15 or more minutes).  Arriving late affects you and other patients whose appointments are after yours.  Also, if you miss three or more appointments without notifying the office, you may be dismissed from the clinic at the provider's discretion.      For prescription refill requests, have your pharmacy contact our office and allow 72 hours for refills to be completed.    Today you received the following chemotherapy and/or immunotherapy agents KANJINTI and PERJETA      To help prevent nausea and vomiting after your treatment, we encourage you to take your nausea medication as directed.  BELOW ARE SYMPTOMS THAT SHOULD BE REPORTED IMMEDIATELY: *FEVER GREATER THAN 100.4 F (38 C) OR HIGHER *CHILLS OR SWEATING *NAUSEA AND VOMITING THAT IS NOT CONTROLLED WITH YOUR NAUSEA MEDICATION *UNUSUAL SHORTNESS OF BREATH *UNUSUAL BRUISING OR BLEEDING *URINARY PROBLEMS (pain or burning when urinating, or frequent urination) *BOWEL PROBLEMS (unusual diarrhea, constipation, pain near the anus) TENDERNESS IN MOUTH AND THROAT WITH OR WITHOUT PRESENCE OF ULCERS (sore throat, sores in mouth, or a toothache) UNUSUAL RASH, SWELLING OR PAIN  UNUSUAL VAGINAL DISCHARGE OR ITCHING   Items with * indicate a potential emergency and should be followed up as soon as possible or go to the Emergency Department if any problems should occur.  Please show the CHEMOTHERAPY ALERT CARD or IMMUNOTHERAPY ALERT CARD at  check-in to the Emergency Department and triage nurse.  Should you have questions after your visit or need to cancel or reschedule your appointment, please contact [facility] CANCER [place] AT [place]  [phone] and follow the prompts.  Office hours are 8:00 a.m. to 4:30 p.m. Monday - Friday. Please note that voicemails left after 4:00 p.m. may not be returned until the following business day.  We are closed weekends and major holidays. You have access to a nurse at all times for urgent questions. Please call the main number to the clinic [phone] and follow the prompts.  For any non-urgent questions, you may also contact your provider using MyChart. We now offer e-Visits for anyone 67 and older to request care online for non-urgent symptoms. For details visit [email or web address].   Also download the MyChart app! Go to the app store, search "MyChart", open the app, select [place], and log in with your MyChart username and password.  Masks are optional in the cancer centers. If you would like for your care team to wear a mask while they are taking care of you, please let them know. For doctor visits, patients may have with them one support person who is at least 39 years old. At this time, visitors are not allowed in the infusion area.   Pertuzumab Injection What is this medication? PERTUZUMAB (per TOOZ ue mab) treats breast cancer. It works by blocking a protein that causes cancer cells to grow and multiply. This helps to slow or stop the spread of cancer cells. It is a monoclonal antibody. This medicine may be used for other purposes; ask  your health care provider or pharmacist if you have questions. COMMON BRAND NAME(S): PERJETA What should I tell my care team before I take this medication? They need to know if you have any of these conditions: Heart failure An unusual or allergic reaction to pertuzumab, other medications, foods, dyes, or preservatives Pregnant or trying  to get pregnant Breast-feeding How should I use this medication? This medication is injected into a vein. It is given by your care team in a hospital or clinic setting. Talk to your care team about the use of this medication in children. Special care may be needed. Overdosage: If you think you have taken too much of this medicine contact a poison control center or emergency room at once. NOTE: This medicine is only for you. Do not share this medicine with others. What if I miss a dose? Keep appointments for follow-up doses. It is important not to miss your dose. Call your care team if you are unable to keep an appointment. What may interact with this medication? Interactions are not expected. This list may not describe all possible interactions. Give your health care provider a list of all the medicines, herbs, non-prescription drugs, or dietary supplements you use. Also tell them if you smoke, drink alcohol, or use illegal drugs. Some items may interact with your medicine. What should I watch for while using this medication? Your condition will be monitored carefully while you are receiving this medication. This medication may make you feel generally unwell. This is not uncommon as chemotherapy can affect healthy cells as well as cancer cells. Report any side effects. Continue your course of treatment even though you feel ill unless your care team tells you to stop. Talk to your care team if you may be pregnant. Serious birth defects can occur if you take this medication during pregnancy and for 7 months after the last dose. You will need a negative pregnancy test before starting this medication. Contraception is recommended while taking this medication and for 7 months after the last dose. Your care team can help you find the option that works for you. Do not breastfeed while taking this medication and for 7 months after the last dose. What side effects may I notice from receiving this  medication? Side effects that you should report to your care team as soon as possible: Allergic reactions or angioedema--skin rash, itching or hives, swelling of the face, eyes, lips, tongue, arms, or legs, trouble swallowing or breathing Heart failure--shortness of breath, swelling of the ankles, feet, or hands, sudden weight gain, unusual weakness or fatigue Infusion reactions--chest pain, shortness of breath or trouble breathing, feeling faint or lightheaded Side effects that usually do not require medical attention (report to your care team if they continue or are bothersome): Diarrhea Dry skin Fatigue Hair loss Nausea Vomiting This list may not describe all possible side effects. Call your doctor for medical advice about side effects. You may report side effects to FDA at 1-800-FDA-1088. Where should I keep my medication? This medication is given in a hospital or clinic. It will not be stored at home. NOTE: This sheet is a summary. It may not cover all possible information. If you have questions about this medicine, talk to your doctor, pharmacist, or health care provider.  2023 Elsevier/Gold Standard (2022-03-31 00:00:00)   Trastuzumab Injection What is this medication? TRASTUZUMAB (tras TOO zoo mab) treats breast cancer and stomach cancer. It works by blocking a protein that causes cancer cells to grow and multiply.  This helps to slow or stop the spread of cancer cells. This medicine may be used for other purposes; ask your health care provider or pharmacist if you have questions. COMMON BRAND NAME(S): Herceptin, Janae Bridgeman, Ontruzant, Trazimera What should I tell my care team before I take this medication? They need to know if you have any of these conditions: Heart failure Lung disease An unusual or allergic reaction to trastuzumab, other medications, foods, dyes, or preservatives Pregnant or trying to get pregnant Breast-feeding How should I use this  medication? This medication is injected into a vein. It is given by your care team in a hospital or clinic setting. Talk to your care team about the use of this medication in children. It is not approved for use in children. Overdosage: If you think you have taken too much of this medicine contact a poison control center or emergency room at once. NOTE: This medicine is only for you. Do not share this medicine with others. What if I miss a dose? Keep appointments for follow-up doses. It is important not to miss your dose. Call your care team if you are unable to keep an appointment. What may interact with this medication? Certain types of chemotherapy, such as daunorubicin, doxorubicin, epirubicin, idarubicin This list may not describe all possible interactions. Give your health care provider a list of all the medicines, herbs, non-prescription drugs, or dietary supplements you use. Also tell them if you smoke, drink alcohol, or use illegal drugs. Some items may interact with your medicine. What should I watch for while using this medication? Your condition will be monitored carefully while you are receiving this medication. This medication may make you feel generally unwell. This is not uncommon, as chemotherapy affects healthy cells as well as cancer cells. Report any side effects. Continue your course of treatment even though you feel ill unless your care team tells you to stop. This medication may increase your risk of getting an infection. Call your care team for advice if you get a fever, chills, sore throat, or other symptoms of a cold or flu. Do not treat yourself. Try to avoid being around people who are sick. Avoid taking medications that contain aspirin, acetaminophen, ibuprofen, naproxen, or ketoprofen unless instructed by your care team. These medications can hide a fever. Talk to your care team if you may be pregnant. Serious birth defects can occur if you take this medication during  pregnancy and for 7 months after the last dose. You will need a negative pregnancy test before starting this medication. Contraception is recommended while taking this medication and for 7 months after the last dose. Your care team can help you find the option that works for you. Do not breastfeed while taking this medication and for 7 months after stopping treatment. What side effects may I notice from receiving this medication? Side effects that you should report to your care team as soon as possible: Allergic reactions or angioedema--skin rash, itching or hives, swelling of the face, eyes, lips, tongue, arms, or legs, trouble swallowing or breathing Dry cough, shortness of breath or trouble breathing Heart failure--shortness of breath, swelling of the ankles, feet, or hands, sudden weight gain, unusual weakness or fatigue Infection--fever, chills, cough, or sore throat Infusion reactions--chest pain, shortness of breath or trouble breathing, feeling faint or lightheaded Side effects that usually do not require medical attention (report to your care team if they continue or are bothersome): Diarrhea Dizziness Headache Nausea Trouble sleeping Vomiting This list  may not describe all possible side effects. Call your doctor for medical advice about side effects. You may report side effects to FDA at 1-800-FDA-1088. Where should I keep my medication? This medication is given in a hospital or clinic. It will not be stored at home. NOTE: This sheet is a summary. It may not cover all possible information. If you have questions about this medicine, talk to your doctor, pharmacist, or health care provider.  2023 Elsevier/Gold Standard (2022-04-12 00:00:00)

## 2022-07-15 NOTE — Assessment & Plan Note (Signed)
Recurrent breast cancer with isolated bone metastasis, stage IV #Initially stage IIIA right breast cancer, ER positive, HER-2 negative status post bilateral oophorectomy,Right mastectomy and right axillary lymph node dissection, status post implant, and implant removal.  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 Estrogen positive, HER-2 positive breast cancer.- s/p Palliative radiation to T7  Labs reviewed and discussed Proceed with Kanjinti Perjecta.   Continue fulvestrant monthly.

## 2022-07-16 ENCOUNTER — Other Ambulatory Visit: Payer: Self-pay

## 2022-07-21 ENCOUNTER — Other Ambulatory Visit: Payer: Self-pay | Admitting: *Deleted

## 2022-07-22 ENCOUNTER — Encounter: Payer: Self-pay | Admitting: Oncology

## 2022-07-22 MED ORDER — OXYCODONE HCL 5 MG PO TABS: 5.0000 mg | ORAL_TABLET | Freq: Four times a day (QID) | ORAL | 0 refills | Status: DC | PRN

## 2022-07-29 ENCOUNTER — Inpatient Hospital Stay: Payer: Medicare HMO

## 2022-08-05 ENCOUNTER — Inpatient Hospital Stay: Payer: Medicare HMO

## 2022-08-05 VITALS — BP 96/68 | HR 93 | Temp 97.6°F | Resp 16 | Wt 173.5 lb

## 2022-08-05 DIAGNOSIS — Z5112 Encounter for antineoplastic immunotherapy: Secondary | ICD-10-CM | POA: Diagnosis not present

## 2022-08-05 DIAGNOSIS — Z170 Estrogen receptor positive status [ER+]: Secondary | ICD-10-CM

## 2022-08-05 DIAGNOSIS — C50919 Malignant neoplasm of unspecified site of unspecified female breast: Secondary | ICD-10-CM

## 2022-08-05 LAB — COMPREHENSIVE METABOLIC PANEL
ALT: 19 U/L (ref 0–44)
AST: 26 U/L (ref 15–41)
Albumin: 4.1 g/dL (ref 3.5–5.0)
Alkaline Phosphatase: 68 U/L (ref 38–126)
Anion gap: 5 (ref 5–15)
BUN: 21 mg/dL — ABNORMAL HIGH (ref 6–20)
CO2: 23 mmol/L (ref 22–32)
Calcium: 8.9 mg/dL (ref 8.9–10.3)
Chloride: 108 mmol/L (ref 98–111)
Creatinine, Ser: 0.91 mg/dL (ref 0.44–1.00)
GFR, Estimated: >60 mL/min (ref >60)
Glucose, Bld: 94 mg/dL (ref 70–99)
Potassium: 3.5 mmol/L (ref 3.5–5.1)
Sodium: 136 mmol/L (ref 135–145)
Total Bilirubin: 0.3 mg/dL (ref 0.3–1.2)
Total Protein: 7.4 g/dL (ref 6.5–8.1)

## 2022-08-05 LAB — CBC WITH DIFFERENTIAL/PLATELET
Abs Immature Granulocytes: 0.01 10*3/uL (ref 0.00–0.07)
Basophils Absolute: 0 10*3/uL (ref 0.0–0.1)
Basophils Relative: 1 %
Eosinophils Absolute: 0.1 10*3/uL (ref 0.0–0.5)
Eosinophils Relative: 1 %
HCT: 32.8 % — ABNORMAL LOW (ref 36.0–46.0)
Hemoglobin: 11 g/dL — ABNORMAL LOW (ref 12.0–15.0)
Immature Granulocytes: 0 %
Lymphocytes Relative: 27 %
Lymphs Abs: 1.7 10*3/uL (ref 0.7–4.0)
MCH: 30 pg (ref 26.0–34.0)
MCHC: 33.5 g/dL (ref 30.0–36.0)
MCV: 89.4 fL (ref 80.0–100.0)
Monocytes Absolute: 0.5 10*3/uL (ref 0.1–1.0)
Monocytes Relative: 8 %
Neutro Abs: 4.1 10*3/uL (ref 1.7–7.7)
Neutrophils Relative %: 63 %
Platelets: 260 10*3/uL (ref 150–400)
RBC: 3.67 MIL/uL — ABNORMAL LOW (ref 3.87–5.11)
RDW: 13.6 % (ref 11.5–15.5)
WBC: 6.5 10*3/uL (ref 4.0–10.5)
nRBC: 0 % (ref 0.0–0.2)

## 2022-08-05 MED ORDER — DIPHENHYDRAMINE HCL 25 MG PO CAPS
50.0000 mg | ORAL_CAPSULE | Freq: Once | ORAL | Status: AC
  Administered 2022-08-05: 50 mg via ORAL
  Filled 2022-08-05: qty 2

## 2022-08-05 MED ORDER — SODIUM CHLORIDE 0.9% FLUSH
10.0000 mL | INTRAVENOUS | Status: DC | PRN
  Administered 2022-08-05: 10 mL
  Filled 2022-08-05: qty 10

## 2022-08-05 MED ORDER — ZOLEDRONIC ACID 4 MG/100ML IV SOLN
4.0000 mg | Freq: Once | INTRAVENOUS | Status: AC
  Administered 2022-08-05: 4 mg via INTRAVENOUS
  Filled 2022-08-05: qty 100

## 2022-08-05 MED ORDER — ACETAMINOPHEN 325 MG PO TABS
650.0000 mg | ORAL_TABLET | Freq: Once | ORAL | Status: AC
  Administered 2022-08-05: 650 mg via ORAL
  Filled 2022-08-05: qty 2

## 2022-08-05 MED ORDER — TRASTUZUMAB-ANNS CHEMO 150 MG IV SOLR
6.0000 mg/kg | Freq: Once | INTRAVENOUS | Status: AC
  Administered 2022-08-05: 504 mg via INTRAVENOUS
  Filled 2022-08-05: qty 24

## 2022-08-05 MED ORDER — FULVESTRANT 250 MG/5ML IM SOSY
500.0000 mg | PREFILLED_SYRINGE | INTRAMUSCULAR | Status: DC
  Administered 2022-08-05: 500 mg via INTRAMUSCULAR
  Filled 2022-08-05: qty 10

## 2022-08-05 MED ORDER — SODIUM CHLORIDE 0.9 % IV SOLN
420.0000 mg | Freq: Once | INTRAVENOUS | Status: AC
  Administered 2022-08-05: 420 mg via INTRAVENOUS
  Filled 2022-08-05: qty 14

## 2022-08-05 MED ORDER — HEPARIN SOD (PORK) LOCK FLUSH 100 UNIT/ML IV SOLN
500.0000 [IU] | Freq: Once | INTRAVENOUS | Status: AC | PRN
  Administered 2022-08-05: 500 [IU]
  Filled 2022-08-05: qty 5

## 2022-08-05 MED ORDER — SODIUM CHLORIDE 0.9 % IV SOLN
Freq: Once | INTRAVENOUS | Status: AC
  Filled 2022-08-05: qty 250

## 2022-08-05 NOTE — Patient Instructions (Signed)
[facility] CANCER CTR AT [place]  Discharge Instructions: Thank you for choosing [facility] to provide your oncology and hematology care.  If you have a lab appointment with the [facility], please go directly to the [facility] [facility] and check in at the registration area.  Wear comfortable clothing and clothing appropriate for easy access to any Portacath or PICC line.   We strive to give you quality time with your provider. You may need to reschedule your appointment if you arrive late (15 or more minutes).  Arriving late affects you and other patients whose appointments are after yours.  Also, if you miss three or more appointments without notifying the office, you may be dismissed from the clinic at the provider's discretion.      For prescription refill requests, have your pharmacy contact our office and allow 72 hours for refills to be completed.    Today you received the following chemotherapy and/or immunotherapy agents herceptin, perjeta, zometa, faslodex      To help prevent nausea and vomiting after your treatment, we encourage you to take your nausea medication as directed.  BELOW ARE SYMPTOMS THAT SHOULD BE REPORTED IMMEDIATELY: *FEVER GREATER THAN 100.4 F (38 C) OR HIGHER *CHILLS OR SWEATING *NAUSEA AND VOMITING THAT IS NOT CONTROLLED WITH YOUR NAUSEA MEDICATION *UNUSUAL SHORTNESS OF BREATH *UNUSUAL BRUISING OR BLEEDING *URINARY PROBLEMS (pain or burning when urinating, or frequent urination) *BOWEL PROBLEMS (unusual diarrhea, constipation, pain near the anus) TENDERNESS IN MOUTH AND THROAT WITH OR WITHOUT PRESENCE OF ULCERS (sore throat, sores in mouth, or a toothache) UNUSUAL RASH, SWELLING OR PAIN  UNUSUAL VAGINAL DISCHARGE OR ITCHING   Items with * indicate a potential emergency and should be followed up as soon as possible or go to the Emergency Department if any problems should occur.  Please show the CHEMOTHERAPY ALERT CARD or IMMUNOTHERAPY  ALERT CARD at check-in to the Emergency Department and triage nurse.  Should you have questions after your visit or need to cancel or reschedule your appointment, please contact [facility] CANCER [place] AT [place]  [phone] and follow the prompts.  Office hours are 8:00 a.m. to 4:30 p.m. Monday - Friday. Please note that voicemails left after 4:00 p.m. may not be returned until the following business day.  We are closed weekends and major holidays. You have access to a nurse at all times for urgent questions. Please call the main number to the clinic [phone] and follow the prompts.  For any non-urgent questions, you may also contact your provider using MyChart. We now offer e-Visits for anyone 45 and older to request care online for non-urgent symptoms. For details visit [email or web address].   Also download the MyChart app! Go to the app store, search "MyChart", open the app, select [place], and log in with your MyChart username and password.  Masks are optional in the cancer centers. If you would like for your care team to wear a mask while they are taking care of you, please let them know. For doctor visits, patients may have with them one support person who is at least 39 years old. At this time, visitors are not allowed in the infusion area.

## 2022-08-19 ENCOUNTER — Other Ambulatory Visit: Payer: Self-pay | Admitting: Oncology

## 2022-08-21 ENCOUNTER — Other Ambulatory Visit: Payer: Self-pay | Admitting: Oncology

## 2022-08-21 DIAGNOSIS — C50811 Malignant neoplasm of overlapping sites of right female breast: Secondary | ICD-10-CM

## 2022-08-21 NOTE — Progress Notes (Signed)
ON [facility] - Breast  No Change  Continue With Treatment as Ordered.  Original Decision Date/Time: 08/31/2020 08:25     A cycle is every 21 days:     Pertuzumab      Pertuzumab      Trastuzumab-xxxx      Trastuzumab-xxxx   **Always confirm dose/schedule in your pharmacy ordering system**  Patient Characteristics: Distant Metastases or Locoregional Recurrent Disease - Unresected or Locally Advanced Unresectable Disease Progressing after Neoadjuvant and Local Therapies, HER2 Positive, ER Positive, HER2-Targeted Therapy (Concurrent with Endocrine Therapy) Therapeutic Status: Distant Metastases ER Status: Positive (+) HER2 Status: Positive (+) PR Status: Negative (-) Intent of Therapy: Non-Curative / Palliative Intent, Not Discussed with Patient

## 2022-08-22 ENCOUNTER — Encounter: Payer: Self-pay | Admitting: Oncology

## 2022-08-23 ENCOUNTER — Ambulatory Visit: Payer: Medicare HMO | Attending: Nurse Practitioner

## 2022-08-23 DIAGNOSIS — T451X5D Adverse effect of antineoplastic and immunosuppressive drugs, subsequent encounter: Secondary | ICD-10-CM

## 2022-08-23 DIAGNOSIS — I427 Cardiomyopathy due to drug and external agent: Secondary | ICD-10-CM

## 2022-08-23 DIAGNOSIS — T451X5A Adverse effect of antineoplastic and immunosuppressive drugs, initial encounter: Secondary | ICD-10-CM

## 2022-08-23 LAB — ECHOCARDIOGRAM LIMITED
Area-P 1/2: 2.95 cm2
Calc EF: 49.7 %
S' Lateral: 3.6 cm
Single Plane A2C EF: 51 %
Single Plane A4C EF: 50.1 %

## 2022-08-24 ENCOUNTER — Telehealth: Payer: Self-pay | Admitting: *Deleted

## 2022-08-24 DIAGNOSIS — T451X5A Adverse effect of antineoplastic and immunosuppressive drugs, initial encounter: Secondary | ICD-10-CM

## 2022-08-24 DIAGNOSIS — I427 Cardiomyopathy due to drug and external agent: Secondary | ICD-10-CM

## 2022-08-24 NOTE — Telephone Encounter (Signed)
Left voicemail message to call back for review of results and to schedule repeat testing in 3 months.

## 2022-08-24 NOTE — Telephone Encounter (Signed)
-----   Message from Theora Gianotti, NP sent at 08/24/2022  7:22 AM EDT ----- Stable low-normal heart squeezing function at 50-55%.  No significant valvular disease.  Follow-up limited echo in 3 months - please place order under Dr. Saunders Revel or Thurmond Butts, as I haven't seen Theresa Chaney in 4 years.

## 2022-08-25 NOTE — Telephone Encounter (Signed)
Reviewed results and recommendations with patient. She verbalized understanding with no further questions at this time. Order has been placed for repeat testing and sending over to scheduling to assist with making that appointment.

## 2022-08-26 ENCOUNTER — Inpatient Hospital Stay: Payer: Medicare HMO

## 2022-08-26 ENCOUNTER — Other Ambulatory Visit: Payer: Self-pay | Admitting: Oncology

## 2022-08-26 ENCOUNTER — Inpatient Hospital Stay: Payer: Medicare HMO | Attending: Oncology

## 2022-08-26 ENCOUNTER — Other Ambulatory Visit: Payer: Self-pay

## 2022-08-26 ENCOUNTER — Inpatient Hospital Stay: Payer: Medicare HMO | Admitting: Oncology

## 2022-08-26 VITALS — BP 116/76 | HR 88 | Temp 96.9°F | Resp 16 | Wt 172.2 lb

## 2022-08-26 DIAGNOSIS — Z5111 Encounter for antineoplastic chemotherapy: Secondary | ICD-10-CM | POA: Diagnosis not present

## 2022-08-26 DIAGNOSIS — Z79899 Other long term (current) drug therapy: Secondary | ICD-10-CM | POA: Diagnosis not present

## 2022-08-26 DIAGNOSIS — C50411 Malignant neoplasm of upper-outer quadrant of right female breast: Secondary | ICD-10-CM | POA: Diagnosis present

## 2022-08-26 DIAGNOSIS — C50919 Malignant neoplasm of unspecified site of unspecified female breast: Secondary | ICD-10-CM

## 2022-08-26 DIAGNOSIS — Z5112 Encounter for antineoplastic immunotherapy: Secondary | ICD-10-CM | POA: Diagnosis present

## 2022-08-26 DIAGNOSIS — C7951 Secondary malignant neoplasm of bone: Secondary | ICD-10-CM | POA: Insufficient documentation

## 2022-08-26 DIAGNOSIS — C50811 Malignant neoplasm of overlapping sites of right female breast: Secondary | ICD-10-CM

## 2022-08-26 LAB — COMPREHENSIVE METABOLIC PANEL
ALT: 19 U/L (ref 0–44)
AST: 28 U/L (ref 15–41)
Albumin: 4.2 g/dL (ref 3.5–5.0)
Alkaline Phosphatase: 67 U/L (ref 38–126)
Anion gap: 5 (ref 5–15)
BUN: 17 mg/dL (ref 6–20)
CO2: 24 mmol/L (ref 22–32)
Calcium: 9.2 mg/dL (ref 8.9–10.3)
Chloride: 111 mmol/L (ref 98–111)
Creatinine, Ser: 0.93 mg/dL (ref 0.44–1.00)
GFR, Estimated: >60 mL/min (ref >60)
Glucose, Bld: 108 mg/dL — ABNORMAL HIGH (ref 70–99)
Potassium: 3.4 mmol/L — ABNORMAL LOW (ref 3.5–5.1)
Sodium: 140 mmol/L (ref 135–145)
Total Bilirubin: <0.1 mg/dL — ABNORMAL LOW (ref 0.3–1.2)
Total Protein: 7.5 g/dL (ref 6.5–8.1)

## 2022-08-26 LAB — CBC WITH DIFFERENTIAL/PLATELET
Abs Immature Granulocytes: 0.02 10*3/uL (ref 0.00–0.07)
Basophils Absolute: 0 10*3/uL (ref 0.0–0.1)
Basophils Relative: 1 %
Eosinophils Absolute: 0.1 10*3/uL (ref 0.0–0.5)
Eosinophils Relative: 1 %
HCT: 32.6 % — ABNORMAL LOW (ref 36.0–46.0)
Hemoglobin: 10.9 g/dL — ABNORMAL LOW (ref 12.0–15.0)
Immature Granulocytes: 0 %
Lymphocytes Relative: 28 %
Lymphs Abs: 2 10*3/uL (ref 0.7–4.0)
MCH: 30.3 pg (ref 26.0–34.0)
MCHC: 33.4 g/dL (ref 30.0–36.0)
MCV: 90.6 fL (ref 80.0–100.0)
Monocytes Absolute: 0.5 10*3/uL (ref 0.1–1.0)
Monocytes Relative: 7 %
Neutro Abs: 4.3 10*3/uL (ref 1.7–7.7)
Neutrophils Relative %: 63 %
Platelets: 278 10*3/uL (ref 150–400)
RBC: 3.6 MIL/uL — ABNORMAL LOW (ref 3.87–5.11)
RDW: 13.6 % (ref 11.5–15.5)
WBC: 6.9 10*3/uL (ref 4.0–10.5)
nRBC: 0 % (ref 0.0–0.2)

## 2022-08-26 MED ORDER — DIPHENHYDRAMINE HCL 25 MG PO CAPS: 50.0000 mg | ORAL_CAPSULE | Freq: Once | ORAL | Status: DC

## 2022-08-26 MED ORDER — TRASTUZUMAB-ANNS CHEMO 150 MG IV SOLR
6.0000 mg/kg | Freq: Once | INTRAVENOUS | Status: AC
  Administered 2022-08-26: 462 mg via INTRAVENOUS
  Filled 2022-08-26: qty 22

## 2022-08-26 MED ORDER — ACETAMINOPHEN 325 MG PO TABS: 650.0000 mg | ORAL_TABLET | Freq: Once | ORAL | Status: DC

## 2022-08-26 MED ORDER — SODIUM CHLORIDE 0.9 % IV SOLN
Freq: Once | INTRAVENOUS | Status: AC
  Filled 2022-08-26: qty 250

## 2022-08-26 MED ORDER — HEPARIN SOD (PORK) LOCK FLUSH 100 UNIT/ML IV SOLN
500.0000 [IU] | Freq: Once | INTRAVENOUS | Status: AC | PRN
  Administered 2022-08-26: 500 [IU]
  Filled 2022-08-26: qty 5

## 2022-08-26 MED ORDER — SODIUM CHLORIDE 0.9 % IV SOLN
420.0000 mg | Freq: Once | INTRAVENOUS | Status: AC
  Administered 2022-08-26: 420 mg via INTRAVENOUS
  Filled 2022-08-26: qty 14

## 2022-09-01 ENCOUNTER — Other Ambulatory Visit: Payer: Self-pay | Admitting: Oncology

## 2022-09-02 ENCOUNTER — Inpatient Hospital Stay: Payer: Medicare HMO

## 2022-09-02 VITALS — BP 116/73 | HR 88 | Temp 97.3°F | Resp 20 | Wt 173.5 lb

## 2022-09-02 DIAGNOSIS — C50919 Malignant neoplasm of unspecified site of unspecified female breast: Secondary | ICD-10-CM

## 2022-09-02 DIAGNOSIS — Z5111 Encounter for antineoplastic chemotherapy: Secondary | ICD-10-CM | POA: Diagnosis not present

## 2022-09-02 DIAGNOSIS — C50411 Malignant neoplasm of upper-outer quadrant of right female breast: Secondary | ICD-10-CM

## 2022-09-02 LAB — CBC WITH DIFFERENTIAL/PLATELET
Abs Immature Granulocytes: 0.03 10*3/uL (ref 0.00–0.07)
Basophils Absolute: 0 10*3/uL (ref 0.0–0.1)
Basophils Relative: 1 %
Eosinophils Absolute: 0.1 10*3/uL (ref 0.0–0.5)
Eosinophils Relative: 1 %
HCT: 32.5 % — ABNORMAL LOW (ref 36.0–46.0)
Hemoglobin: 10.8 g/dL — ABNORMAL LOW (ref 12.0–15.0)
Immature Granulocytes: 0 %
Lymphocytes Relative: 23 %
Lymphs Abs: 1.8 10*3/uL (ref 0.7–4.0)
MCH: 30.1 pg (ref 26.0–34.0)
MCHC: 33.2 g/dL (ref 30.0–36.0)
MCV: 90.5 fL (ref 80.0–100.0)
Monocytes Absolute: 0.6 10*3/uL (ref 0.1–1.0)
Monocytes Relative: 8 %
Neutro Abs: 5.1 10*3/uL (ref 1.7–7.7)
Neutrophils Relative %: 67 %
Platelets: 269 10*3/uL (ref 150–400)
RBC: 3.59 MIL/uL — ABNORMAL LOW (ref 3.87–5.11)
RDW: 13.8 % (ref 11.5–15.5)
WBC: 7.7 10*3/uL (ref 4.0–10.5)
nRBC: 0 % (ref 0.0–0.2)

## 2022-09-02 LAB — COMPREHENSIVE METABOLIC PANEL
ALT: 19 U/L (ref 0–44)
AST: 28 U/L (ref 15–41)
Albumin: 4 g/dL (ref 3.5–5.0)
Alkaline Phosphatase: 64 U/L (ref 38–126)
Anion gap: 3 — ABNORMAL LOW (ref 5–15)
BUN: 17 mg/dL (ref 6–20)
CO2: 23 mmol/L (ref 22–32)
Calcium: 8.8 mg/dL — ABNORMAL LOW (ref 8.9–10.3)
Chloride: 111 mmol/L (ref 98–111)
Creatinine, Ser: 0.76 mg/dL (ref 0.44–1.00)
GFR, Estimated: >60 mL/min (ref >60)
Glucose, Bld: 124 mg/dL — ABNORMAL HIGH (ref 70–99)
Potassium: 3.5 mmol/L (ref 3.5–5.1)
Sodium: 137 mmol/L (ref 135–145)
Total Bilirubin: 0.2 mg/dL — ABNORMAL LOW (ref 0.3–1.2)
Total Protein: 7.5 g/dL (ref 6.5–8.1)

## 2022-09-02 MED ORDER — ZOLEDRONIC ACID 4 MG/100ML IV SOLN
4.0000 mg | Freq: Once | INTRAVENOUS | Status: AC
  Administered 2022-09-02: 4 mg via INTRAVENOUS
  Filled 2022-09-02: qty 100

## 2022-09-02 MED ORDER — SODIUM CHLORIDE 0.9 % IV SOLN
INTRAVENOUS | Status: DC
  Filled 2022-09-02: qty 250

## 2022-09-02 MED ORDER — HEPARIN SOD (PORK) LOCK FLUSH 100 UNIT/ML IV SOLN
500.0000 [IU] | Freq: Once | INTRAVENOUS | Status: AC
  Administered 2022-09-02: 500 [IU] via INTRAVENOUS
  Filled 2022-09-02: qty 5

## 2022-09-02 MED ORDER — FULVESTRANT 250 MG/5ML IM SOSY
500.0000 mg | PREFILLED_SYRINGE | INTRAMUSCULAR | Status: DC
  Administered 2022-09-02: 500 mg via INTRAMUSCULAR
  Filled 2022-09-02: qty 10

## 2022-09-16 ENCOUNTER — Inpatient Hospital Stay (HOSPITAL_BASED_OUTPATIENT_CLINIC_OR_DEPARTMENT_OTHER): Payer: Medicare HMO | Admitting: Oncology

## 2022-09-16 ENCOUNTER — Inpatient Hospital Stay: Payer: Medicare HMO

## 2022-09-16 ENCOUNTER — Encounter: Payer: Self-pay | Admitting: Oncology

## 2022-09-16 ENCOUNTER — Inpatient Hospital Stay: Payer: Medicare HMO | Attending: Oncology

## 2022-09-16 VITALS — BP 107/71 | HR 89 | Temp 98.2°F | Resp 17 | Wt 171.0 lb

## 2022-09-16 DIAGNOSIS — I428 Other cardiomyopathies: Secondary | ICD-10-CM | POA: Diagnosis not present

## 2022-09-16 DIAGNOSIS — Z5112 Encounter for antineoplastic immunotherapy: Secondary | ICD-10-CM

## 2022-09-16 DIAGNOSIS — Z79899 Other long term (current) drug therapy: Secondary | ICD-10-CM | POA: Diagnosis not present

## 2022-09-16 DIAGNOSIS — C50811 Malignant neoplasm of overlapping sites of right female breast: Secondary | ICD-10-CM

## 2022-09-16 DIAGNOSIS — C50411 Malignant neoplasm of upper-outer quadrant of right female breast: Secondary | ICD-10-CM | POA: Diagnosis present

## 2022-09-16 DIAGNOSIS — M1990 Unspecified osteoarthritis, unspecified site: Secondary | ICD-10-CM

## 2022-09-16 DIAGNOSIS — Z170 Estrogen receptor positive status [ER+]: Secondary | ICD-10-CM

## 2022-09-16 DIAGNOSIS — C7951 Secondary malignant neoplasm of bone: Secondary | ICD-10-CM | POA: Diagnosis present

## 2022-09-16 DIAGNOSIS — T451X5A Adverse effect of antineoplastic and immunosuppressive drugs, initial encounter: Secondary | ICD-10-CM

## 2022-09-16 DIAGNOSIS — G620 Drug-induced polyneuropathy: Secondary | ICD-10-CM | POA: Diagnosis not present

## 2022-09-16 DIAGNOSIS — C50919 Malignant neoplasm of unspecified site of unspecified female breast: Secondary | ICD-10-CM

## 2022-09-16 LAB — CBC WITH DIFFERENTIAL/PLATELET
Abs Immature Granulocytes: 0.02 10*3/uL (ref 0.00–0.07)
Basophils Absolute: 0 10*3/uL (ref 0.0–0.1)
Basophils Relative: 0 %
Eosinophils Absolute: 0.1 10*3/uL (ref 0.0–0.5)
Eosinophils Relative: 1 %
HCT: 33 % — ABNORMAL LOW (ref 36.0–46.0)
Hemoglobin: 11.1 g/dL — ABNORMAL LOW (ref 12.0–15.0)
Immature Granulocytes: 0 %
Lymphocytes Relative: 27 %
Lymphs Abs: 1.9 10*3/uL (ref 0.7–4.0)
MCH: 30 pg (ref 26.0–34.0)
MCHC: 33.6 g/dL (ref 30.0–36.0)
MCV: 89.2 fL (ref 80.0–100.0)
Monocytes Absolute: 0.4 10*3/uL (ref 0.1–1.0)
Monocytes Relative: 6 %
Neutro Abs: 4.6 10*3/uL (ref 1.7–7.7)
Neutrophils Relative %: 66 %
Platelets: 314 10*3/uL (ref 150–400)
RBC: 3.7 MIL/uL — ABNORMAL LOW (ref 3.87–5.11)
RDW: 13.5 % (ref 11.5–15.5)
WBC: 7.1 10*3/uL (ref 4.0–10.5)
nRBC: 0 % (ref 0.0–0.2)

## 2022-09-16 LAB — COMPREHENSIVE METABOLIC PANEL
ALT: 21 U/L (ref 0–44)
AST: 30 U/L (ref 15–41)
Albumin: 4.2 g/dL (ref 3.5–5.0)
Alkaline Phosphatase: 72 U/L (ref 38–126)
Anion gap: 5 (ref 5–15)
BUN: 14 mg/dL (ref 6–20)
CO2: 21 mmol/L — ABNORMAL LOW (ref 22–32)
Calcium: 8.8 mg/dL — ABNORMAL LOW (ref 8.9–10.3)
Chloride: 111 mmol/L (ref 98–111)
Creatinine, Ser: 0.93 mg/dL (ref 0.44–1.00)
GFR, Estimated: >60 mL/min (ref >60)
Glucose, Bld: 152 mg/dL — ABNORMAL HIGH (ref 70–99)
Potassium: 3.4 mmol/L — ABNORMAL LOW (ref 3.5–5.1)
Sodium: 137 mmol/L (ref 135–145)
Total Bilirubin: 0.3 mg/dL (ref 0.3–1.2)
Total Protein: 7.6 g/dL (ref 6.5–8.1)

## 2022-09-16 LAB — SEDIMENTATION RATE: Sed Rate: 41 mm/hr — ABNORMAL HIGH (ref 0–20)

## 2022-09-16 LAB — C-REACTIVE PROTEIN: CRP: 1.1 mg/dL — ABNORMAL HIGH (ref <1.0)

## 2022-09-16 MED ORDER — ACETAMINOPHEN 325 MG PO TABS: 650.0000 mg | ORAL_TABLET | Freq: Once | ORAL | Status: DC

## 2022-09-16 MED ORDER — SODIUM CHLORIDE 0.9 % IV SOLN
420.0000 mg | Freq: Once | INTRAVENOUS | Status: AC
  Administered 2022-09-16: 420 mg via INTRAVENOUS
  Filled 2022-09-16: qty 14

## 2022-09-16 MED ORDER — DIPHENHYDRAMINE HCL 25 MG PO CAPS: 50.0000 mg | ORAL_CAPSULE | Freq: Once | ORAL | Status: DC

## 2022-09-16 MED ORDER — TRASTUZUMAB-ANNS CHEMO 150 MG IV SOLR
6.0000 mg/kg | Freq: Once | INTRAVENOUS | Status: AC
  Administered 2022-09-16: 462 mg via INTRAVENOUS
  Filled 2022-09-16: qty 22

## 2022-09-16 MED ORDER — SODIUM CHLORIDE 0.9 % IV SOLN
Freq: Once | INTRAVENOUS | Status: AC
  Filled 2022-09-16: qty 250

## 2022-09-16 MED ORDER — HEPARIN SOD (PORK) LOCK FLUSH 100 UNIT/ML IV SOLN
500.0000 [IU] | Freq: Once | INTRAVENOUS | Status: AC | PRN
  Administered 2022-09-16: 500 [IU]
  Filled 2022-09-16: qty 5

## 2022-09-16 NOTE — Progress Notes (Signed)
Hematology/Oncology Progress note [phone] [phone]    Name of the patient: Theresa Chaney  [id number]  May 30, 1981   Date of visit: 09/16/22   ASSESSMENT & PLAN:   Cancer Staging  Breast cancer of upper-outer quadrant of right female breast [facility]) Staging form: Breast, AJCC 8th Edition - Clinical: G2, ER+, PR+, HER2- - Signed by Earlie Server, MD on 04/05/2018 - Pathologic stage from 04/04/2018: No Stage Recommended (ypT3, pN2, cM0, G3, ER+, PR-, HER2-) - Signed by Earlie Server, MD on 04/04/2018 - Pathologic: Stage IV (rpTX, pNX, cM1, ER+, PR-, HER2+) - Signed by Earlie Server, MD on 12/10/2020   Breast cancer of upper-outer quadrant of right female breast ([place]) Recurrent breast cancer with isolated bone metastasis, stage IV #Initially stage IIIA right breast cancer, ER positive, HER-2 negative status post bilateral oophorectomy,Right mastectomy and right axillary lymph node dissection, status post implant, and implant removal.  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 Estrogen positive, HER-2 positive breast cancer.- s/p Palliative radiation to T7  Labs reviewed and discussed Proceed with Kanjinti Perjecta.   Continue fulvestrant monthly. Obtain restaging PET  Nonischemic cardiomyopathy ([place]) Follow up with cardiology. Chronic tachycardia Stable low-normal LVEF 50-55%.  Neuropathy due to chemotherapeutic drug (HCC) continue Lyrica and nortriptyline symptoms stable  Bone metastasis status post radiation. Continue Zometa monthly. Continue calcium supplementation. Continue oxycodone as needed.  last PET scan showed no evidence of local recurrence, distant metastasis disease.  Encounter for monoclonal antibody treatment for malignancy Antibody treatment plan as list above.  Orders Placed This Encounter  Procedures   NM PET Image Restag (PS) Skull Base To Thigh    Standing Status:    Future    Standing Expiration Date:   09/16/2023    Order Specific Question:   If indicated for the ordered procedure, I authorize the administration of a radiopharmaceutical per Radiology protocol    Answer:   Yes    Order Specific Question:   Is the patient pregnant?    Answer:   No    Order Specific Question:   Preferred imaging location?    Answer:   [facility]   Sedimentation rate    Standing Status:   Future    Number of Occurrences:   1    Standing Expiration Date:   09/16/2023   C-reactive protein    Standing Status:   Future    Number of Occurrences:   1    Standing Expiration Date:   09/17/2023   Comprehensive metabolic panel    Standing Status:   Future    Standing Expiration Date:   10/07/2023   CBC with Differential    Standing Status:   Future    Standing Expiration Date:   10/07/2023   Comprehensive metabolic panel    Standing Status:   Future    Standing Expiration Date:   10/28/2023   CBC with Differential    Standing Status:   Future    Standing Expiration Date:   10/28/2023   Comprehensive metabolic panel    Standing Status:   Future    Standing Expiration Date:   11/18/2023   CBC with Differential    Standing Status:   Future    Standing Expiration Date:   11/18/2023   C-reactive protein    Standing Status:   Future    Standing Expiration Date:   12/10/2023   Sedimentation rate    Standing Status:  Future    Standing Expiration Date:   12/09/2023   Comprehensive metabolic panel    Standing Status:   Future    Standing Expiration Date:   12/09/2023   CBC with Differential    Standing Status:   Future    Standing Expiration Date:   12/09/2023   lab [place] 3 weeks.  lab MD Kanjinti Perjecta 6 weeks.  fulvestrant/Zometa Q 4 weeks   All questions were answered. The patient knows to call the clinic with any problems, questions or concerns.  Earlie Server, MD, PhD [facility] Hematology Oncology 09/16/2022      REASON FOR VISIT Follow  up for  treatment of breast cancer  Oncology History  Oncology History Overview Note        Malignant neoplasm of overlapping sites of right breast in female, estrogen receptor positive ([place])  10/19/2017 Initial Diagnosis   Malignant neoplasm of overlapping sites of right breast in female, estrogen receptor positive ([place])   09/17/2020 - 08/05/2022 Chemotherapy   Patient is on Treatment Plan : BREAST Trastuzumab + Pertuzumab q21d     09/17/2020 -  Chemotherapy   Patient is on Treatment Plan : BREAST Trastuzumab + Pertuzumab q21d     Breast cancer of upper-outer quadrant of right female breast ([place])  11/08/2017 - 01/03/2018 Chemotherapy   Neoadjuvant ddAC x 4 + 1 cycle of Taxol  due to lack of response, surgery was offered.   03/19/2018 Surgery   S/p right mastectomy and right axillary dissection, immediate breast reconstruction with placement of expanders.  ypT3 ypN2, + lymphovascular invasion,  Grade 3, margin is negative, close. ER 90%, PR 0%, HER2 IHC negative.   # elective bilateral salpingo-oophorectomy..    06/11/2018 Imaging   s/p 11 cycles Taxol adjuvantly   10/10/2018 -  Radiation Therapy   adjuvant right chest wall radiation   06/03/2019 Surgery   underwent elective left prophylactic mastectomy and sentinel lymph node biopsy of left axilla Pathology negative for malignancy  # Mediport removal  # right implant removal on    07/07/2020 Imaging   MRI thoracic spine without contrast showed lesions involving the T7 posterior elements most concerning for metastatic lesion.  No evidence of epidural tumor.  Minimal thoracic spondylosis without stenosis. MRI was reviewed by me and a PET scan was obtained for further evaluation   07/20/2020 Imaging   PET scan showed hypermetabolic metastasis involving the posterior element of T7, no additional evidence of metastasis in the neck, chest, abdomen or pelvis.   07/29/2020 Procedure   T7 lesion biopsy showed metastatic carcinoma,  compatible with breast origin.  Receptor status staining showed ER 71-80% positive, PR negative, HER-2 positive IHC 3+   08/31/2020 -  Radiation Therapy    finished spine radiation    09/17/2020 -  Chemotherapy   Patient is on Treatment Plan :  BREAST Trastuzumab + Pertuzumab q21d     12/10/2020 Cancer Staging   Staging form: Breast, AJCC 8th Edition - Pathologic: Stage IV (rpTX, pNX, cM1, ER+, PR-, HER2+) - Signed by Earlie Server, MD on 12/10/2020   03/15/2021 Imaging    PET scan showed no focal hypermetabolic activity to suggest skeletal metastasis.  Mild hypermetabolic activity along the right T7-8 paraspinal musculature.  Max SUV 3.3.  Likely postprocedural-   09/28/2021 Echocardiogram   further decrease of LVEF to 45%   10/14/2021 Imaging   PET  Post bilateral mastectomy and RIGHT axillary dissection without signs of recurrent or metastatic disease.  11/12/2021 Echocardiogram   LVEF of 45-50%.   02/18/2022 Imaging   MRI thoracic and lumbar spine w wo contrast  1. Negative MRIs of the thoracic and lumbar spine. No evidence for locally recurrent metastasis at the level of T7. No other new metastatic disease elsewhere with thoracolumbar spine. 2. No significant disc pathology, stenosis, or evidence for neural impingement.    02/25/2022 Echocardiogram   LVEF 55-60%.   03/16/2022 Imaging   PET showed Stable PET-CT. No findings for local recurrent breast cancer,locoregional adenopathy or distant metastatic disease.    07/20/2022 Imaging   MRI Thoracic Spine w wo contrast 1. Interval resolution of the increased T2 signal contrast enhancement in the posterior elements of T7. No new lesion identified. 2. No spinal canal or neural foraminal stenosis.   08/23/2022 Echocardiogram   Stable low-normal LVEF at 50-55%.      INTERVAL HISTORY 41 yo female with above oncology history reviewed by me presents for follow-up of management of metastatic breast cancer.  Chronic back pain, unchanged.    Intermittent diarrhea, manageable.  She has no new complaints.      Review of Systems  Constitutional:  Negative for chills, fever, malaise/fatigue and weight loss.  HENT:  Negative for sore throat.   Eyes:  Negative for redness.  Respiratory:  Negative for cough, shortness of breath and wheezing.   Cardiovascular:  Negative for chest pain, palpitations and leg swelling.  Gastrointestinal:  Negative for abdominal pain, blood in stool, heartburn, nausea and vomiting.  Genitourinary:  Negative for dysuria.  Musculoskeletal:  Positive for back pain and joint pain. Negative for myalgias.  Skin:  Negative for rash.  Neurological:  Positive for tingling. Negative for dizziness and tremors.  Endo/Heme/Allergies:  Does not bruise/bleed easily.  Psychiatric/Behavioral:  Negative for hallucinations. The patient does not have insomnia.     No Known Allergies  Patient Active Problem List   Diagnosis Date Noted   Breast cancer of upper-outer quadrant of right female breast ([place]) 03/19/2018    Priority: High   Thyroid nodule 06/04/2022    Priority: Medium    Hypokalemia 06/03/2022    Priority: Medium    Bone metastasis 03/25/2021    Priority: Medium    Encounter for monoclonal antibody treatment for malignancy 12/10/2020    Priority: Medium    Hypocalcemia 11/19/2020    Priority: Medium    Encounter for antineoplastic chemotherapy 10/29/2020    Priority: Medium    Neuropathy due to chemotherapeutic drug ([place]) 08/11/2020    Priority: Medium    Rheumatoid arteritis ([place]) 10/13/2019    Priority: Medium    Nonischemic cardiomyopathy ([place]) 08/23/2018    Priority: Medium    Goals of care, counseling/discussion 08/11/2020    Priority: Low   Tachycardia, unspecified 04/15/2021   Bone lesion 11/19/2020   Inflammatory arthritis 11/19/2020   HER2-positive carcinoma of breast ([place]) 08/11/2020   Bilateral hand swelling 10/03/2019   Fracture of neck of metacarpal bone 05/14/2019   Chronic  fatigue 04/09/2019   Polyarthralgia 04/09/2019   Status post right breast reconstruction 02/26/2019   Status post right mastectomy 02/26/2019   Mastalgia 02/15/2019   Shortness of breath 08/23/2018   Preprocedural cardiovascular examination 08/23/2018   Tachycardia 08/23/2018   Palpitations 08/23/2018   Estrogen receptor positive status (ER+) 04/04/2018   Acquired absence of right breast and nipple 04/03/2018   Family history of cancer    Malignant neoplasm of overlapping sites of right breast in female, estrogen receptor positive ([place]) 10/19/2017  Gastroesophageal reflux disease without esophagitis 02/24/2017   Generalized anxiety disorder 10/03/2014   Headache 10/03/2014     Past Medical History:  Diagnosis Date   Anemia    Arthritis    BRCA negative 11/26/2017   Breast cancer ([place]) 10/11/2017   Multifocal, ER positive, PR negative, HER-2 negative. ypT3 ypN2a 8.7 cm, 4/15 nodes   Cardiomyopathy ([place])    a. 10/2017 Echo: EF 60-65%, no rwma, Gr1 DD, nl RV size/fxn; b. 04/2018 Echo: EF 55-60%, no rwma, Nl RV size/fxn; c. 08/2018 Echo: EF 45%, diff HK, ? HK of antsept wall. Gr1 DD. Mild MR. Mild LAE/RAE. Mod dil RV.    Chronic bronchitis ([place]) 11/2017   COPD (chronic obstructive pulmonary disease) (HCC)    MILD PER CXR   Depression    Family history of cancer    GERD (gastroesophageal reflux disease)    Headache    MIGRAINES   Heart murmur    ASYMPTOMATIC   Personal history of chemotherapy    current for right breast ca     Past Surgical History:  Procedure Laterality Date   AXILLARY LYMPH NODE DISSECTION Right 03/19/2018   Procedure: AXILLARY LYMPH NODE DISSECTION;  Surgeon: Robert Bellow, MD;  Location: [facility];  Service: General;  Laterality: Right;   BREAST BIOPSY Right 10/11/2017   12:30 posterior coil clip invasive mammary carcinoma   BREAST BIOPSY Right 10/11/2017   11:30 middle depth ribbon clip DCIS   BREAST BIOPSY Right 10/11/2017   5:30 anterior depth x  shape invasive ductal carcinoma   BREAST IMPLANT REMOVAL Right 06/03/2019   Procedure: REMOVAL OF RIGHT BREAST IMPLANTS;  Surgeon: Wallace Going, DO;  Location: [facility];  Service: Plastics;  Laterality: Right;   BREAST RECONSTRUCTION WITH PLACEMENT OF TISSUE EXPANDER AND FLEX HD (ACELLULAR HYDRATED DERMIS) Right 03/19/2018   Procedure: BREAST RECONSTRUCTION WITH PLACEMENT OF TISSUE EXPANDER AND FLEX HD (ACELLULAR HYDRATED DERMIS);  Surgeon: Wallace Going, DO;  Location: [facility];  Service: Plastics;  Laterality: Right;   CARPAL TUNNEL RELEASE Bilateral 2020   CHOLECYSTECTOMY N/A 04/27/2020   Procedure: LAPAROSCOPIC CHOLECYSTECTOMY WITH INTRAOPERATIVE CHOLANGIOGRAM;  Surgeon: Robert Bellow, MD;  Location: [facility];  Service: General;  Laterality: N/A;   ESOPHAGOGASTRODUODENOSCOPY (EGD) WITH PROPOFOL N/A 04/17/2020   Procedure: ESOPHAGOGASTRODUODENOSCOPY (EGD) WITH PROPOFOL;  Surgeon: Robert Bellow, MD;  Location: [facility];  Service: Endoscopy;  Laterality: N/A;  with biopsy   LAPAROSCOPIC BILATERAL SALPINGO OOPHERECTOMY Bilateral 03/19/2018   Procedure: LAPAROSCOPIC BILATERAL SALPINGO OOPHORECTOMY;  Surgeon: Benjaman Kindler, MD;  Location: [facility];  Service: Gynecology;  Laterality: Bilateral;   MASTECTOMY Right 03/2018   MASTECTOMY W/ SENTINEL NODE BIOPSY Right 03/19/2018   Procedure: MASTECTOMY WITH SENTINEL LYMPH NODE BIOPSY;  Surgeon: Robert Bellow, MD;  Location: [place] [facility];  Service: General;  Laterality: Right;   PORT-A-CATH REMOVAL Left 06/03/2019   Procedure: REMOVAL PORT-A-CATH;  Surgeon: Robert Bellow, MD;  Location: [place] [facility];  Service: General;  Laterality: Left;   PORTACATH PLACEMENT Left 10/24/2017   Procedure: INSERTION PORT-A-CATH;  Surgeon: Robert Bellow, MD;  Location: [facility];  Service: General;  Laterality: Left;   PORTACATH PLACEMENT Right 09/28/2020   Procedure: INSERTION PORT-A-CATH;  Surgeon: Robert Bellow, MD;  Location: [facility];   Service: General;  Laterality: Right;   REMOVAL OF TISSUE EXPANDER AND PLACEMENT OF IMPLANT Right 07/20/2018   Procedure: REMOVAL OF RIGHT BREAST TISSUE EXPANDER AND PLACEMENT OF IMPLANT;  Surgeon: Wallace Going, DO;  Location:  [place];  Service: Plastics;  Laterality: Right;   SIMPLE MASTECTOMY WITH AXILLARY SENTINEL NODE BIOPSY Left 06/03/2019   Procedure: SIMPLE MASTECTOMY LEFT;  Surgeon: Robert Bellow, MD;  Location: [facility];  Service: General;  Laterality: Left;    Social History   Socioeconomic History   Marital status: Married    Spouse name: Not on file   Number of children: Not on file   Years of education: Not on file   Highest education level: Not on file  Occupational History   Occupation: pharmacy tech    Comment: [employer]   Tobacco Use   Smoking status: Every Day    Packs/day: 0.50    Years: 18.00    Total pack years: 9.00    Types: Cigarettes    Start date: 06/21/2018   Smokeless tobacco: Never  Vaping Use   Vaping Use: Never used  Substance and Sexual Activity   Alcohol use: No   Drug use: No   Sexual activity: Yes    Birth control/protection: Injection, Other-see comments    Comment: has had hysterectomy  Other Topics Concern   Not on file  Social History Narrative   Lives at home with husband and daughter   Social Determinants of Health   Financial Resource Strain: Not on file  Food Insecurity: Not on file  Transportation Needs: Not on file  Physical Activity: Not on file  Stress: Not on file  Social Connections: Not on file  Intimate Partner Violence: Not on file     Family History  Problem Relation Age of Onset   Diabetes Father    Hypertension Father    Hyperlipidemia Father    Heart attack Father 45       "mild"   Melanoma Maternal Aunt        other aunts with BCC/SCC/Melanoma   Cervical cancer Maternal Aunt 34       daughter w/ cervical cancer as well   Lung cancer Maternal  Aunt    Melanoma Maternal Uncle        other uncles with BCC/SCC/Melanoma   Breast cancer Paternal Aunt    Bladder Cancer Maternal Grandmother   Biological mother had Grave's disease.    Current Outpatient Medications:    acetaminophen (TYLENOL) 500 MG tablet, Take 500 mg by mouth every 6 (six) hours as needed., Disp: , Rfl:    albuterol (VENTOLIN HFA) 108 (90 Base) MCG/ACT inhaler, Inhale 2 puffs into the lungs every 6 (six) hours as needed for wheezing or shortness of breath. , Disp: , Rfl:    CALCIUM CARBONATE-VITAMIN D PO, Take 600-800 mg by mouth in the morning, at noon, and at bedtime., Disp: , Rfl:    chlorhexidine (PERIDEX) 0.12 % solution, Use as directed 15 mLs in the mouth or throat 2 (two) times daily., Disp: 120 mL, Rfl: 0   cyanocobalamin (,VITAMIN B-12,) 1000 MCG/ML injection, Inject 1,000 mcg into the skin every 30 (thirty) days., Disp: , Rfl:    cyclobenzaprine (FLEXERIL) 5 MG tablet, Take 5 mg by mouth 3 (three) times daily as needed for muscle spasms., Disp: , Rfl:    diphenoxylate-atropine (LOMOTIL) 2.5-0.025 MG tablet, Take 1 tablet by mouth 4 (four) times daily as needed for diarrhea or loose stools., Disp: 60 tablet, Rfl: 0   escitalopram (LEXAPRO) 20 MG tablet, Take 20 mg by mouth daily., Disp: , Rfl:    esomeprazole (NEXIUM) 40 MG capsule, Take 40 mg by mouth daily  before breakfast. , Disp: , Rfl:    Eszopiclone 3 MG TABS, Take 3 mg by mouth at bedtime as needed (sleep). , Disp: , Rfl:    folic acid (FOLVITE) 1 MG tablet, Take 1 mg by mouth daily., Disp: , Rfl:    hydroxychloroquine (PLAQUENIL) 200 MG tablet, Take 200 mg by mouth daily., Disp: , Rfl:    ibuprofen (ADVIL) 800 MG tablet, Take 800 mg by mouth every 8 (eight) hours as needed for moderate pain., Disp: , Rfl:    loperamide (IMODIUM) 2 MG capsule, Take 1 tablet (2 mg total) by mouth See admin instructions. Take 2 tablets with onset of diarrhea, then take 1 tablet every 2 hours until diarrhea stops. Maximum 8  tablets in 24, Disp: 90 capsule, Rfl: 0   loratadine (CLARITIN) 10 MG tablet, Take 10 mg by mouth daily. , Disp: , Rfl:    LORazepam (ATIVAN) 1 MG tablet, Take 1 mg by mouth 3 (three) times daily., Disp: , Rfl:    Magnesium 500 MG TABS, Take 500 mg by mouth 2 (two) times daily., Disp: , Rfl:    methotrexate (RHEUMATREX) 2.5 MG tablet, Take 25 mg by mouth every Sunday. 10 tablets once a week, Disp: , Rfl:    metoprolol succinate (TOPROL XL) 25 MG 24 hr tablet, Take 0.5 tablets (12.5 mg total) by mouth daily., Disp: 45 tablet, Rfl: 3   mupirocin ointment (BACTROBAN) 2 %, Place 1 application into the nose 2 (two) times daily. Use in each nostril twice daily for five (5) days., Disp: 22 g, Rfl: 5   nortriptyline (PAMELOR) 10 MG capsule, Take 30 mg by mouth at bedtime. , Disp: , Rfl:    oxyCODONE (OXY IR/ROXICODONE) 5 MG immediate release tablet, Take 1 tablet (5 mg total) by mouth every 6 (six) hours as needed for moderate pain or severe pain., Disp: 90 tablet, Rfl: 0   phentermine (ADIPEX-P) 37.5 MG tablet, Take 37.5 mg by mouth daily before breakfast. , Disp: , Rfl:    pregabalin (LYRICA) 150 MG capsule, Take 150 mg by mouth 2 (two) times daily., Disp: , Rfl:    promethazine (PHENERGAN) 25 MG tablet, Take 1 tablet (25 mg total) by mouth every 8 (eight) hours as needed for nausea or vomiting., Disp: 90 tablet, Rfl: 0   pyridOXINE (VITAMIN B-6) 100 MG tablet, Take 100 mg by mouth daily., Disp: , Rfl:    sulfaSALAzine (AZULFIDINE) 500 MG EC tablet, Take by mouth., Disp: , Rfl:    topiramate (TOPAMAX) 50 MG tablet, Take 50 mg by mouth 2 (two) times daily. , Disp: , Rfl:    vitamin C (ASCORBIC ACID) 500 MG tablet, Take 500 mg by mouth daily., Disp: , Rfl:    lidocaine-prilocaine (EMLA) cream, Apply to port and cover 1-2 hours prior to appointment (Patient not taking: Reported on 03/04/2022), Disp: 30 g, Rfl: 3   losartan (COZAAR) 25 MG tablet, Take 0.5 tablets (12.5 mg total) by mouth daily. (Patient not  taking: Reported on 07/15/2022), Disp: 45 tablet, Rfl: 3 No current facility-administered medications for this visit.  Facility-Administered Medications Ordered in Other Visits:    heparin lock flush 100 unit/mL, 500 Units, Intravenous, Once, Earlie Server, MD   sodium chloride flush (NS) 0.9 % injection 10 mL, 10 mL, Intravenous, PRN, Earlie Server, MD, 10 mL at 11/19/18 1249   sodium chloride flush (NS) 0.9 % injection 10 mL, 10 mL, Intravenous, Once, Earlie Server, MD   Physical exam:  Vitals:  09/16/22 0958  BP: 107/71  Pulse: 89  Resp: 17  Temp: 98.2 F (36.8 C)  SpO2: 100%  Weight: 171 lb (77.6 kg)  ECOG 1 Physical Exam Constitutional:      General: She is not in acute distress.    Appearance: She is not diaphoretic.  HENT:     Head: Normocephalic and atraumatic.     Nose: Nose normal.     Mouth/Throat:     Pharynx: No oropharyngeal exudate.  Eyes:     General: No scleral icterus.    Pupils: Pupils are equal, round, and reactive to light.  Neck:     Vascular: No JVD.  Cardiovascular:     Rate and Rhythm: Normal rate and regular rhythm.     Heart sounds: Normal heart sounds. No murmur heard. Pulmonary:     Effort: Pulmonary effort is normal. No respiratory distress.     Breath sounds: Normal breath sounds. No wheezing or rales.  Chest:     Chest wall: No tenderness.  Abdominal:     General: Bowel sounds are normal. There is no distension.     Palpations: Abdomen is soft. There is no mass.     Tenderness: There is no abdominal tenderness. There is no rebound.  Musculoskeletal:        General: Normal range of motion.     Cervical back: Normal range of motion and neck supple.  Lymphadenopathy:     Cervical: No cervical adenopathy.  Skin:    General: Skin is warm and dry.     Findings: No erythema or rash.  Neurological:     Mental Status: She is alert and oriented to person, place, and time.     Cranial Nerves: No cranial nerve deficit.     Motor: No abnormal muscle tone.      Coordination: Coordination normal.  Psychiatric:        Mood and Affect: Affect normal.       Labs     Latest Ref Rng & Units 09/16/2022    9:38 AM  CMP  Glucose 70 - 99 mg/dL 152   BUN 6 - 20 mg/dL 14   Creatinine 0.44 - 1.00 mg/dL 0.93   Sodium 135 - 145 mmol/L 137   Potassium 3.5 - 5.1 mmol/L 3.4   Chloride 98 - 111 mmol/L 111   CO2 22 - 32 mmol/L 21   Calcium 8.9 - 10.3 mg/dL 8.8   Total Protein 6.5 - 8.1 g/dL 7.6   Total Bilirubin 0.3 - 1.2 mg/dL 0.3   Alkaline Phos 38 - 126 U/L 72   AST 15 - 41 U/L 30   ALT 0 - 44 U/L 21       Latest Ref Rng & Units 09/16/2022    9:38 AM  CBC  WBC 4.0 - 10.5 K/uL 7.1   Hemoglobin 12.0 - 15.0 g/dL 11.1   Hematocrit 36.0 - 46.0 % 33.0   Platelets 150 - 400 K/uL 314    RADIOGRAPHIC STUDIES: I have personally reviewed the radiological images as listed and agreed with the findings in the report. ECHOCARDIOGRAM LIMITED  Result Date: 08/23/2022    ECHOCARDIOGRAM LIMITED REPORT   Patient Name:   DEZARAY SHIBUYA Date of Exam: 08/23/2022 Medical Rec #:  [id number]      Height:       67.0 in Accession #:    [id number]     Weight:       173.5 lb Date of Birth:  [birth year]      BSA:          1.904 m Patient Age:    41 years       BP:           114/84 mmHg Patient Gender: F              HR:           88 bpm. Exam Location:  [place] Procedure: Limited Echo, Limited Color Doppler and Strain Analysis Indications:    I42.80 Non-ischemic cardiomyopathy  History:        Patient has prior history of Echocardiogram examinations, most                 recent 05/17/2022. Cardiomyopathy, COPD, Arrythmias:Tachycardia,                 Signs/Symptoms:Shortness of Breath and Murmur; Risk Factors:h/o                 chemotherapy and Current Smoker.  Sonographer:    Pilar Jarvis RDMS, RVT, RDCS Referring Phys: [id number] [place]  Sonographer Comments: Last chemo round was 3 weeks ago IMPRESSIONS  1. Left ventricular ejection fraction, by estimation, is 50  to 55%. The left ventricle has low normal function. The left ventricle has no regional wall motion abnormalities. Left ventricular diastolic parameters are consistent with Grade I diastolic dysfunction (impaired relaxation). The average left ventricular global longitudinal strain is -15.4 %.  2. Right ventricular systolic function is normal. The right ventricular size is normal.  3. The mitral valve is normal in structure. Trivial mitral valve regurgitation. No evidence of mitral stenosis.  4. The aortic valve was not well visualized. Aortic valve regurgitation is not visualized. No aortic stenosis is present.  5. The inferior vena cava is normal in size with greater than 50% respiratory variability, suggesting right atrial pressure of 3 mmHg. FINDINGS  Left Ventricle: Left ventricular ejection fraction, by estimation, is 50 to 55%. The left ventricle has low normal function. The left ventricle has no regional wall motion abnormalities. The average left ventricular global longitudinal strain is -15.4 %. The left ventricular internal cavity size was normal in size. There is no left ventricular hypertrophy. Left ventricular diastolic parameters are consistent with Grade I diastolic dysfunction (impaired relaxation). Right Ventricle: The right ventricular size is normal. No increase in right ventricular wall thickness. Right ventricular systolic function is normal. Left Atrium: Left atrial size was normal in size. Right Atrium: Right atrial size was normal in size. Pericardium: There is no evidence of pericardial effusion. Mitral Valve: The mitral valve is normal in structure. Trivial mitral valve regurgitation. No evidence of mitral valve stenosis. Tricuspid Valve: The tricuspid valve is normal in structure. Tricuspid valve regurgitation is not demonstrated. No evidence of tricuspid stenosis. Aortic Valve: The aortic valve was not well visualized. Aortic valve regurgitation is not visualized. No aortic stenosis is  present. Pulmonic Valve: The pulmonic valve was normal in structure. Pulmonic valve regurgitation is not visualized. No evidence of pulmonic stenosis. Aorta: The aortic root is normal in size and structure. Venous: The inferior vena cava is normal in size with greater than 50% respiratory variability, suggesting right atrial pressure of 3 mmHg. IAS/Shunts: No atrial level shunt detected by color flow Doppler. LEFT VENTRICLE PLAX 2D LVIDd:         5.10 cm      Diastology LVIDs:         3.60 cm  LV e' medial:    8.92 cm/s LV PW:         0.60 cm      LV E/e' medial:  7.0 LV IVS:        0.60 cm      LV e' lateral:   13.70 cm/s                             LV E/e' lateral: 4.6  LV Volumes (MOD)            2D Longitudinal Strain LV vol d, MOD A2C: 90.8 ml  2D Strain GLS Avg:     -15.4 % LV vol d, MOD A4C: 100.0 ml LV vol s, MOD A2C: 44.5 ml LV vol s, MOD A4C: 49.9 ml LV SV MOD A2C:     46.3 ml  3D Volume EF: LV SV MOD A4C:     100.0 ml 3D EF:        49 % LV SV MOD BP:      47.3 ml  LV EDV:       158 ml                             LV ESV:       81 ml                             LV SV:        77 ml RIGHT VENTRICLE RV S prime:     12.50 cm/s TAPSE (M-mode): 2.4 cm MITRAL VALVE MV Area (PHT): 2.95 cm MV Decel Time: 257 msec MV E velocity: 62.60 cm/s MV A velocity: 91.30 cm/s MV E/A ratio:  0.69 Ida Rogue MD Electronically signed by Ida Rogue MD Signature Date/Time: 08/23/2022/1:20:22 PM    Final

## 2022-09-16 NOTE — Assessment & Plan Note (Addendum)
Recurrent breast cancer with isolated bone metastasis, stage IV #Initially stage IIIA right breast cancer, ER positive, HER-2 negative status post bilateral oophorectomy,Right mastectomy and right axillary lymph node dissection, status post implant, and implant removal.  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 Estrogen positive, HER-2 positive breast cancer.- s/p Palliative radiation to T7  Labs reviewed and discussed Proceed with Kanjinti Perjecta.   Continue fulvestrant monthly. Obtain restaging PET

## 2022-09-16 NOTE — Progress Notes (Signed)
No new concerns today 

## 2022-09-16 NOTE — Assessment & Plan Note (Signed)
status post radiation. Continue Zometa monthly. Continue calcium supplementation. Continue oxycodone as needed.  last PET scan showed no evidence of local recurrence, distant metastasis disease.

## 2022-09-16 NOTE — Progress Notes (Signed)
Pt refusing premeds stating has declined premeds for several encounters without reaction.

## 2022-09-16 NOTE — Assessment & Plan Note (Addendum)
Follow up with cardiology. Chronic tachycardia Stable low-normal LVEF 50-55%.

## 2022-09-16 NOTE — Patient Instructions (Signed)
[facility] AT [place]  Discharge Instructions: Thank you for choosing [facility] to provide your oncology and hematology care.  If you have a lab appointment with the [facility], please go directly to the [facility] and check in at the registration area.  Wear comfortable clothing and clothing appropriate for easy access to any Portacath or PICC line.   We strive to give you quality time with your provider. You may need to reschedule your appointment if you arrive late (15 or more minutes).  Arriving late affects you and other patients whose appointments are after yours.  Also, if you miss three or more appointments without notifying the office, you may be dismissed from the clinic at the provider's discretion.      For prescription refill requests, have your pharmacy contact our office and allow 72 hours for refills to be completed.    Today you received the following chemotherapy and/or immunotherapy agents Kanjinti & Perjeta      To help prevent nausea and vomiting after your treatment, we encourage you to take your nausea medication as directed.  BELOW ARE SYMPTOMS THAT SHOULD BE REPORTED IMMEDIATELY: *FEVER GREATER THAN 100.4 F (38 C) OR HIGHER *CHILLS OR SWEATING *NAUSEA AND VOMITING THAT IS NOT CONTROLLED WITH YOUR NAUSEA MEDICATION *UNUSUAL SHORTNESS OF BREATH *UNUSUAL BRUISING OR BLEEDING *URINARY PROBLEMS (pain or burning when urinating, or frequent urination) *BOWEL PROBLEMS (unusual diarrhea, constipation, pain near the anus) TENDERNESS IN MOUTH AND THROAT WITH OR WITHOUT PRESENCE OF ULCERS (sore throat, sores in mouth, or a toothache) UNUSUAL RASH, SWELLING OR PAIN  UNUSUAL VAGINAL DISCHARGE OR ITCHING   Items with * indicate a potential emergency and should be followed up as soon as possible or go to the Emergency Department if any problems should occur.  Please show the CHEMOTHERAPY ALERT CARD or IMMUNOTHERAPY ALERT CARD at  check-in to the Emergency Department and triage nurse.  Should you have questions after your visit or need to cancel or reschedule your appointment, please contact [facility] [place] AT [place]  [phone] and follow the prompts.  Office hours are 8:00 a.m. to 4:30 p.m. Monday - Friday. Please note that voicemails left after 4:00 p.m. may not be returned until the following business day.  We are closed weekends and major holidays. You have access to a nurse at all times for urgent questions. Please call the main number to the clinic [phone] and follow the prompts.  For any non-urgent questions, you may also contact your provider using MyChart. We now offer e-Visits for anyone 64 and older to request care online for non-urgent symptoms. For details visit [email or web address].   Also download the MyChart app! Go to the app store, search "MyChart", open the app, select [facility], and log in with your MyChart username and password.  Masks are optional in the cancer centers. If you would like for your care team to wear a mask while they are taking care of you, please let them know. For doctor visits, patients may have with them one support person who is at least 39 years old. At this time, visitors are not allowed in the infusion area.

## 2022-09-16 NOTE — Assessment & Plan Note (Signed)
continue Lyrica and nortriptyline symptoms stable 

## 2022-09-16 NOTE — Assessment & Plan Note (Signed)
Antibody treatment plan as list above. 

## 2022-09-17 ENCOUNTER — Other Ambulatory Visit: Payer: Self-pay

## 2022-09-19 ENCOUNTER — Other Ambulatory Visit: Payer: Self-pay

## 2022-09-22 ENCOUNTER — Encounter: Payer: Self-pay | Admitting: Oncology

## 2022-09-23 ENCOUNTER — Inpatient Hospital Stay: Payer: Medicare HMO

## 2022-09-30 ENCOUNTER — Inpatient Hospital Stay: Payer: Medicare HMO

## 2022-09-30 VITALS — BP 96/73 | HR 100 | Temp 98.1°F | Resp 16

## 2022-09-30 DIAGNOSIS — Z170 Estrogen receptor positive status [ER+]: Secondary | ICD-10-CM

## 2022-09-30 DIAGNOSIS — Z5112 Encounter for antineoplastic immunotherapy: Secondary | ICD-10-CM | POA: Diagnosis not present

## 2022-09-30 DIAGNOSIS — Z95828 Presence of other vascular implants and grafts: Secondary | ICD-10-CM

## 2022-09-30 LAB — COMPREHENSIVE METABOLIC PANEL
ALT: 20 U/L (ref 0–44)
AST: 30 U/L (ref 15–41)
Albumin: 4.3 g/dL (ref 3.5–5.0)
Alkaline Phosphatase: 67 U/L (ref 38–126)
Anion gap: 7 (ref 5–15)
BUN: 12 mg/dL (ref 6–20)
CO2: 22 mmol/L (ref 22–32)
Calcium: 8.9 mg/dL (ref 8.9–10.3)
Chloride: 109 mmol/L (ref 98–111)
Creatinine, Ser: 0.87 mg/dL (ref 0.44–1.00)
GFR, Estimated: >60 mL/min (ref >60)
Glucose, Bld: 107 mg/dL — ABNORMAL HIGH (ref 70–99)
Potassium: 3.5 mmol/L (ref 3.5–5.1)
Sodium: 138 mmol/L (ref 135–145)
Total Bilirubin: 0.2 mg/dL — ABNORMAL LOW (ref 0.3–1.2)
Total Protein: 7.6 g/dL (ref 6.5–8.1)

## 2022-09-30 LAB — CBC WITH DIFFERENTIAL/PLATELET
Abs Immature Granulocytes: 0.01 10*3/uL (ref 0.00–0.07)
Basophils Absolute: 0 10*3/uL (ref 0.0–0.1)
Basophils Relative: 0 %
Eosinophils Absolute: 0.1 10*3/uL (ref 0.0–0.5)
Eosinophils Relative: 2 %
HCT: 34.7 % — ABNORMAL LOW (ref 36.0–46.0)
Hemoglobin: 11.5 g/dL — ABNORMAL LOW (ref 12.0–15.0)
Immature Granulocytes: 0 %
Lymphocytes Relative: 35 %
Lymphs Abs: 1.9 10*3/uL (ref 0.7–4.0)
MCH: 30.2 pg (ref 26.0–34.0)
MCHC: 33.1 g/dL (ref 30.0–36.0)
MCV: 91.1 fL (ref 80.0–100.0)
Monocytes Absolute: 0.5 10*3/uL (ref 0.1–1.0)
Monocytes Relative: 9 %
Neutro Abs: 2.9 10*3/uL (ref 1.7–7.7)
Neutrophils Relative %: 54 %
Platelets: 277 10*3/uL (ref 150–400)
RBC: 3.81 MIL/uL — ABNORMAL LOW (ref 3.87–5.11)
RDW: 13.6 % (ref 11.5–15.5)
WBC: 5.3 10*3/uL (ref 4.0–10.5)
nRBC: 0 % (ref 0.0–0.2)

## 2022-09-30 MED ORDER — ZOLEDRONIC ACID 4 MG/100ML IV SOLN
4.0000 mg | Freq: Once | INTRAVENOUS | Status: AC
  Administered 2022-09-30: 4 mg via INTRAVENOUS
  Filled 2022-09-30: qty 100

## 2022-09-30 MED ORDER — SODIUM CHLORIDE 0.9% FLUSH
10.0000 mL | Freq: Once | INTRAVENOUS | Status: AC
  Administered 2022-09-30: 10 mL via INTRAVENOUS
  Filled 2022-09-30: qty 10

## 2022-09-30 MED ORDER — HEPARIN SOD (PORK) LOCK FLUSH 100 UNIT/ML IV SOLN
INTRAVENOUS | Status: AC
  Administered 2022-09-30: 500 [IU]
  Filled 2022-09-30: qty 5

## 2022-09-30 MED ORDER — SODIUM CHLORIDE 0.9 % IV SOLN
INTRAVENOUS | Status: DC
  Filled 2022-09-30: qty 250

## 2022-09-30 MED ORDER — HEPARIN SOD (PORK) LOCK FLUSH 100 UNIT/ML IV SOLN
INTRAVENOUS | Status: AC
  Filled 2022-09-30: qty 5

## 2022-09-30 MED ORDER — FULVESTRANT 250 MG/5ML IM SOSY
500.0000 mg | PREFILLED_SYRINGE | INTRAMUSCULAR | Status: DC
  Administered 2022-09-30: 500 mg via INTRAMUSCULAR
  Filled 2022-09-30: qty 10

## 2022-09-30 NOTE — Patient Instructions (Signed)
Fulvestrant Injection What is this medication? FULVESTRANT (ful VES trant) treats breast cancer. It works by blocking the hormone estrogen in breast tissue, which prevents breast cancer cells from spreading or growing. This medicine may be used for other purposes; ask your health care provider or pharmacist if you have questions. COMMON BRAND NAME(S): FASLODEX What should I tell my care team before I take this medication? They need to know if you have any of these conditions: Bleeding disorder Liver disease Low blood cell levels, such as low white cells, red cells, and platelets An unusual or allergic reaction to fulvestrant, other medications, foods, dyes, or preservatives Pregnant or trying to get pregnant Breast-feeding How should I use this medication? This medication is injected into a muscle. It is given by your care team in a hospital or clinic setting. Talk to your care team about the use of this medication in children. Special care may be needed. Overdosage: If you think you have taken too much of this medicine contact a poison control center or emergency room at once. NOTE: This medicine is only for you. Do not share this medicine with others. What if I miss a dose? Keep appointments for follow-up doses. It is important not to miss your dose. Call your care team if you are unable to keep an appointment. What may interact with this medication? Certain medications that prevent or treat blood clots, such as warfarin, enoxaparin, dalteparin, apixaban, dabigatran, rivaroxaban This list may not describe all possible interactions. Give your health care provider a list of all the medicines, herbs, non-prescription drugs, or dietary supplements you use. Also tell them if you smoke, drink alcohol, or use illegal drugs. Some items may interact with your medicine. What should I watch for while using this medication? Your condition will be monitored carefully while you are receiving this  medication. You may need blood work while taking this medication. Talk to your care team if you may be pregnant. Serious birth defects can occur if you take this medication during pregnancy and for 1 year after the last dose. You will need a negative pregnancy test before starting this medication. Contraception is recommended while taking this medication and for 1 year after the last dose. Your care team can help you find the option that works for you. Do not breastfeed while taking this medication and for 1 year after the last dose. This medication may cause infertility. Talk to your care team if you are concerned about your fertility. What side effects may I notice from receiving this medication? Side effects that you should report to your care team as soon as possible: Allergic reactions or angioedema--skin rash, itching or hives, swelling of the face, eyes, lips, tongue, arms, or legs, trouble swallowing or breathing Pain, tingling, or numbness in the hands or feet Side effects that usually do not require medical attention (report to your care team if they continue or are bothersome): Bone, joint, or muscle pain Constipation Headache Hot flashes Nausea Pain, redness, or irritation at injection site Unusual weakness or fatigue This list may not describe all possible side effects. Call your doctor for medical advice about side effects. You may report side effects to FDA at 1-800-FDA-1088. Where should I keep my medication? This medication is given in a hospital or clinic. It will not be stored at home. NOTE: This sheet is a summary. It may not cover all possible information. If you have questions about this medicine, talk to your doctor, pharmacist, or health care provider.    2023 Elsevier/Gold Standard (2022-04-11 00:00:00) Zoledronic Acid Injection (Cancer) What is this medication? ZOLEDRONIC ACID (ZOE le dron ik AS id) treats high calcium levels in the blood caused by cancer. It may also  be used with chemotherapy to treat weakened bones caused by cancer. It works by slowing down the release of calcium from bones. This lowers calcium levels in your blood. It also makes your bones stronger and less likely to break (fracture). It belongs to a group of medications called bisphosphonates. This medicine may be used for other purposes; ask your health care provider or pharmacist if you have questions. COMMON BRAND NAME(S): Zometa, Zometa Powder What should I tell my care team before I take this medication? They need to know if you have any of these conditions: Dehydration Dental disease Kidney disease Liver disease Low levels of calcium in the blood Lung or breathing disease, such as asthma Receiving steroids, such as dexamethasone or prednisone An unusual or allergic reaction to zoledronic acid, other medications, foods, dyes, or preservatives Pregnant or trying to get pregnant Breast-feeding How should I use this medication? This medication is injected into a vein. It is given by your care team in a hospital or clinic setting. Talk to your care team about the use of this medication in children. Special care may be needed. Overdosage: If you think you have taken too much of this medicine contact a poison control center or emergency room at once. NOTE: This medicine is only for you. Do not share this medicine with others. What if I miss a dose? Keep appointments for follow-up doses. It is important not to miss your dose. Call your care team if you are unable to keep an appointment. What may interact with this medication? Certain antibiotics given by injection Diuretics, such as bumetanide, furosemide NSAIDs, medications for pain and inflammation, such as ibuprofen or naproxen Teriparatide Thalidomide This list may not describe all possible interactions. Give your health care provider a list of all the medicines, herbs, non-prescription drugs, or dietary supplements you use. Also  tell them if you smoke, drink alcohol, or use illegal drugs. Some items may interact with your medicine. What should I watch for while using this medication? Visit your care team for regular checks on your progress. It may be some time before you see the benefit from this medication. Some people who take this medication have severe bone, joint, or muscle pain. This medication may also increase your risk for jaw problems or a broken thigh bone. Tell your care team right away if you have severe pain in your jaw, bones, joints, or muscles. Tell you care team if you have any pain that does not go away or that gets worse. Tell your dentist and dental surgeon that you are taking this medication. You should not have major dental surgery while on this medication. See your dentist to have a dental exam and fix any dental problems before starting this medication. Take good care of your teeth while on this medication. Make sure you see your dentist for regular follow-up appointments. You should make sure you get enough calcium and vitamin D while you are taking this medication. Discuss the foods you eat and the vitamins you take with your care team. Check with your care team if you have severe diarrhea, nausea, and vomiting, or if you sweat a lot. The loss of too much body fluid may make it dangerous for you to take this medication. You may need bloodwork while taking this medication.   Talk to your care team if you wish to become pregnant or think you might be pregnant. This medication can cause serious birth defects. What side effects may I notice from receiving this medication? Side effects that you should report to your care team as soon as possible: Allergic reactions--skin rash, itching, hives, swelling of the face, lips, tongue, or throat Kidney injury--decrease in the amount of urine, swelling of the ankles, hands, or feet Low calcium level--muscle pain or cramps, confusion, tingling, or numbness in the  hands or feet Osteonecrosis of the jaw--pain, swelling, or redness in the mouth, numbness of the jaw, poor healing after dental work, unusual discharge from the mouth, visible bones in the mouth Severe bone, joint, or muscle pain Side effects that usually do not require medical attention (report to your care team if they continue or are bothersome): Constipation Fatigue Fever Loss of appetite Nausea Stomach pain This list may not describe all possible side effects. Call your doctor for medical advice about side effects. You may report side effects to FDA at 1-800-FDA-1088. Where should I keep my medication? This medication is given in a hospital or clinic. It will not be stored at home. NOTE: This sheet is a summary. It may not cover all possible information. If you have questions about this medicine, talk to your doctor, pharmacist, or health care provider.  2023 Elsevier/Gold Standard (2022-01-13 00:00:00)  

## 2022-10-05 ENCOUNTER — Ambulatory Visit
Admission: RE | Admit: 2022-10-05 | Discharge: 2022-10-05 | Disposition: A | Discharge status: Home / Self Care | Payer: Medicare HMO | Source: Ambulatory Visit | Attending: Oncology | Admitting: Oncology

## 2022-10-05 DIAGNOSIS — Z01818 Encounter for other preprocedural examination: Secondary | ICD-10-CM | POA: Diagnosis not present

## 2022-10-05 DIAGNOSIS — C50411 Malignant neoplasm of upper-outer quadrant of right female breast: Secondary | ICD-10-CM | POA: Diagnosis present

## 2022-10-05 DIAGNOSIS — Z170 Estrogen receptor positive status [ER+]: Secondary | ICD-10-CM | POA: Diagnosis not present

## 2022-10-05 LAB — GLUCOSE, CAPILLARY: Glucose-Capillary: 96 mg/dL (ref 70–99)

## 2022-10-05 MED ORDER — FLUDEOXYGLUCOSE F - 18 (FDG) INJECTION
8.9000 | Freq: Once | INTRAVENOUS | Status: AC | PRN
  Administered 2022-10-05: 9.72 via INTRAVENOUS

## 2022-10-07 ENCOUNTER — Inpatient Hospital Stay: Payer: Medicare HMO

## 2022-10-07 ENCOUNTER — Ambulatory Visit: Payer: Medicare HMO | Admitting: Oncology

## 2022-10-07 VITALS — BP 103/63 | HR 88 | Temp 97.8°F | Resp 16 | Wt 173.7 lb

## 2022-10-07 DIAGNOSIS — E876 Hypokalemia: Secondary | ICD-10-CM

## 2022-10-07 DIAGNOSIS — C50811 Malignant neoplasm of overlapping sites of right female breast: Secondary | ICD-10-CM

## 2022-10-07 DIAGNOSIS — Z5112 Encounter for antineoplastic immunotherapy: Secondary | ICD-10-CM | POA: Diagnosis not present

## 2022-10-07 LAB — CBC WITH DIFFERENTIAL/PLATELET
Abs Immature Granulocytes: 0.02 10*3/uL (ref 0.00–0.07)
Basophils Absolute: 0 10*3/uL (ref 0.0–0.1)
Basophils Relative: 1 %
Eosinophils Absolute: 0.1 10*3/uL (ref 0.0–0.5)
Eosinophils Relative: 1 %
HCT: 30.8 % — ABNORMAL LOW (ref 36.0–46.0)
Hemoglobin: 10.3 g/dL — ABNORMAL LOW (ref 12.0–15.0)
Immature Granulocytes: 0 %
Lymphocytes Relative: 35 %
Lymphs Abs: 2.6 10*3/uL (ref 0.7–4.0)
MCH: 30.7 pg (ref 26.0–34.0)
MCHC: 33.4 g/dL (ref 30.0–36.0)
MCV: 91.7 fL (ref 80.0–100.0)
Monocytes Absolute: 0.7 10*3/uL (ref 0.1–1.0)
Monocytes Relative: 10 %
Neutro Abs: 4 10*3/uL (ref 1.7–7.7)
Neutrophils Relative %: 53 %
Platelets: 268 10*3/uL (ref 150–400)
RBC: 3.36 MIL/uL — ABNORMAL LOW (ref 3.87–5.11)
RDW: 13.5 % (ref 11.5–15.5)
WBC: 7.5 10*3/uL (ref 4.0–10.5)
nRBC: 0 % (ref 0.0–0.2)

## 2022-10-07 LAB — COMPREHENSIVE METABOLIC PANEL
ALT: 28 U/L (ref 0–44)
AST: 33 U/L (ref 15–41)
Albumin: 4 g/dL (ref 3.5–5.0)
Alkaline Phosphatase: 76 U/L (ref 38–126)
Anion gap: 7 (ref 5–15)
BUN: 20 mg/dL (ref 6–20)
CO2: 23 mmol/L (ref 22–32)
Calcium: 8.5 mg/dL — ABNORMAL LOW (ref 8.9–10.3)
Chloride: 107 mmol/L (ref 98–111)
Creatinine, Ser: 0.93 mg/dL (ref 0.44–1.00)
GFR, Estimated: >60 mL/min (ref >60)
Glucose, Bld: 119 mg/dL — ABNORMAL HIGH (ref 70–99)
Potassium: 3 mmol/L — ABNORMAL LOW (ref 3.5–5.1)
Sodium: 137 mmol/L (ref 135–145)
Total Bilirubin: 0.1 mg/dL — ABNORMAL LOW (ref 0.3–1.2)
Total Protein: 7.2 g/dL (ref 6.5–8.1)

## 2022-10-07 MED ORDER — HEPARIN SOD (PORK) LOCK FLUSH 100 UNIT/ML IV SOLN
500.0000 [IU] | Freq: Once | INTRAVENOUS | Status: AC | PRN
  Administered 2022-10-07: 500 [IU]
  Filled 2022-10-07: qty 5

## 2022-10-07 MED ORDER — TRASTUZUMAB-ANNS CHEMO 150 MG IV SOLR
6.0000 mg/kg | Freq: Once | INTRAVENOUS | Status: AC
  Administered 2022-10-07: 462 mg via INTRAVENOUS
  Filled 2022-10-07: qty 22

## 2022-10-07 MED ORDER — ACETAMINOPHEN 325 MG PO TABS
650.0000 mg | ORAL_TABLET | Freq: Once | ORAL | Status: DC
  Filled 2022-10-07: qty 2

## 2022-10-07 MED ORDER — DIPHENHYDRAMINE HCL 25 MG PO CAPS
50.0000 mg | ORAL_CAPSULE | Freq: Once | ORAL | Status: DC
  Filled 2022-10-07: qty 2

## 2022-10-07 MED ORDER — SODIUM CHLORIDE 0.9 % IV SOLN
Freq: Once | INTRAVENOUS | Status: AC
  Filled 2022-10-07: qty 250

## 2022-10-07 MED ORDER — POTASSIUM CHLORIDE 20 MEQ/100ML IV SOLN
20.0000 meq | Freq: Once | INTRAVENOUS | Status: AC
  Administered 2022-10-07: 20 meq via INTRAVENOUS

## 2022-10-07 MED ORDER — SODIUM CHLORIDE 0.9 % IV SOLN
420.0000 mg | Freq: Once | INTRAVENOUS | Status: AC
  Administered 2022-10-07: 420 mg via INTRAVENOUS
  Filled 2022-10-07: qty 14

## 2022-10-20 ENCOUNTER — Other Ambulatory Visit: Payer: Self-pay

## 2022-10-20 DIAGNOSIS — Z170 Estrogen receptor positive status [ER+]: Secondary | ICD-10-CM

## 2022-10-21 ENCOUNTER — Other Ambulatory Visit: Payer: Self-pay | Admitting: Oncology

## 2022-10-21 ENCOUNTER — Inpatient Hospital Stay: Payer: Medicare HMO

## 2022-10-21 VITALS — BP 113/67 | HR 96 | Temp 97.0°F | Resp 16 | Wt 176.8 lb

## 2022-10-21 DIAGNOSIS — C7951 Secondary malignant neoplasm of bone: Secondary | ICD-10-CM | POA: Diagnosis present

## 2022-10-21 DIAGNOSIS — Z79899 Other long term (current) drug therapy: Secondary | ICD-10-CM | POA: Insufficient documentation

## 2022-10-21 DIAGNOSIS — Z5112 Encounter for antineoplastic immunotherapy: Secondary | ICD-10-CM | POA: Diagnosis present

## 2022-10-21 DIAGNOSIS — Z5111 Encounter for antineoplastic chemotherapy: Secondary | ICD-10-CM | POA: Insufficient documentation

## 2022-10-21 DIAGNOSIS — C50411 Malignant neoplasm of upper-outer quadrant of right female breast: Secondary | ICD-10-CM | POA: Diagnosis present

## 2022-10-21 LAB — CBC WITH DIFFERENTIAL/PLATELET
Abs Immature Granulocytes: 0.02 10*3/uL (ref 0.00–0.07)
Basophils Absolute: 0.1 10*3/uL (ref 0.0–0.1)
Basophils Relative: 1 %
Eosinophils Absolute: 0.1 10*3/uL (ref 0.0–0.5)
Eosinophils Relative: 1 %
HCT: 31.9 % — ABNORMAL LOW (ref 36.0–46.0)
Hemoglobin: 10.6 g/dL — ABNORMAL LOW (ref 12.0–15.0)
Immature Granulocytes: 0 %
Lymphocytes Relative: 25 %
Lymphs Abs: 2 10*3/uL (ref 0.7–4.0)
MCH: 30.5 pg (ref 26.0–34.0)
MCHC: 33.2 g/dL (ref 30.0–36.0)
MCV: 91.9 fL (ref 80.0–100.0)
Monocytes Absolute: 0.8 10*3/uL (ref 0.1–1.0)
Monocytes Relative: 10 %
Neutro Abs: 5.1 10*3/uL (ref 1.7–7.7)
Neutrophils Relative %: 63 %
Platelets: 283 10*3/uL (ref 150–400)
RBC: 3.47 MIL/uL — ABNORMAL LOW (ref 3.87–5.11)
RDW: 13.8 % (ref 11.5–15.5)
WBC: 8 10*3/uL (ref 4.0–10.5)
nRBC: 0 % (ref 0.0–0.2)

## 2022-10-21 LAB — COMPREHENSIVE METABOLIC PANEL
ALT: 20 U/L (ref 0–44)
AST: 25 U/L (ref 15–41)
Albumin: 3.9 g/dL (ref 3.5–5.0)
Alkaline Phosphatase: 61 U/L (ref 38–126)
Anion gap: 6 (ref 5–15)
BUN: 18 mg/dL (ref 6–20)
CO2: 21 mmol/L — ABNORMAL LOW (ref 22–32)
Calcium: 8.5 mg/dL — ABNORMAL LOW (ref 8.9–10.3)
Chloride: 112 mmol/L — ABNORMAL HIGH (ref 98–111)
Creatinine, Ser: 0.86 mg/dL (ref 0.44–1.00)
GFR, Estimated: >60 mL/min (ref >60)
Glucose, Bld: 105 mg/dL — ABNORMAL HIGH (ref 70–99)
Potassium: 3.2 mmol/L — ABNORMAL LOW (ref 3.5–5.1)
Sodium: 139 mmol/L (ref 135–145)
Total Bilirubin: 0.3 mg/dL (ref 0.3–1.2)
Total Protein: 7 g/dL (ref 6.5–8.1)

## 2022-10-21 MED ORDER — DIPHENHYDRAMINE HCL 25 MG PO CAPS: 50.0000 mg | ORAL_CAPSULE | Freq: Once | ORAL | Status: DC

## 2022-10-21 MED ORDER — SODIUM CHLORIDE 0.9 % IV SOLN
Freq: Once | INTRAVENOUS | Status: AC
  Filled 2022-10-21: qty 250

## 2022-10-21 MED ORDER — TRASTUZUMAB-ANNS CHEMO 150 MG IV SOLR: 6.0000 mg/kg | Freq: Once | INTRAVENOUS | Status: DC

## 2022-10-21 MED ORDER — SODIUM CHLORIDE 0.9 % IV SOLN: 420.0000 mg | Freq: Once | INTRAVENOUS | Status: DC

## 2022-10-21 MED ORDER — ACETAMINOPHEN 325 MG PO TABS: 650.0000 mg | ORAL_TABLET | Freq: Once | ORAL | Status: DC

## 2022-10-21 MED ORDER — HEPARIN SOD (PORK) LOCK FLUSH 100 UNIT/ML IV SOLN
500.0000 [IU] | Freq: Once | INTRAVENOUS | Status: AC | PRN
  Administered 2022-10-21: 500 [IU]
  Filled 2022-10-21: qty 5

## 2022-10-21 NOTE — Patient Instructions (Signed)

## 2022-10-28 ENCOUNTER — Inpatient Hospital Stay: Payer: Medicare HMO

## 2022-10-28 ENCOUNTER — Encounter: Payer: Self-pay | Admitting: Oncology

## 2022-10-28 ENCOUNTER — Inpatient Hospital Stay: Payer: Medicare HMO | Attending: Oncology

## 2022-10-28 ENCOUNTER — Inpatient Hospital Stay (HOSPITAL_BASED_OUTPATIENT_CLINIC_OR_DEPARTMENT_OTHER): Payer: Medicare HMO | Admitting: Oncology

## 2022-10-28 VITALS — BP 121/74 | HR 102 | Temp 98.5°F | Wt 175.7 lb

## 2022-10-28 DIAGNOSIS — Z170 Estrogen receptor positive status [ER+]: Secondary | ICD-10-CM

## 2022-10-28 DIAGNOSIS — I428 Other cardiomyopathies: Secondary | ICD-10-CM | POA: Diagnosis not present

## 2022-10-28 DIAGNOSIS — C50411 Malignant neoplasm of upper-outer quadrant of right female breast: Secondary | ICD-10-CM

## 2022-10-28 DIAGNOSIS — G620 Drug-induced polyneuropathy: Secondary | ICD-10-CM | POA: Diagnosis not present

## 2022-10-28 DIAGNOSIS — Z5111 Encounter for antineoplastic chemotherapy: Secondary | ICD-10-CM | POA: Diagnosis not present

## 2022-10-28 DIAGNOSIS — C50811 Malignant neoplasm of overlapping sites of right female breast: Secondary | ICD-10-CM

## 2022-10-28 DIAGNOSIS — Z5112 Encounter for antineoplastic immunotherapy: Secondary | ICD-10-CM

## 2022-10-28 DIAGNOSIS — E876 Hypokalemia: Secondary | ICD-10-CM

## 2022-10-28 DIAGNOSIS — M0520 Rheumatoid vasculitis with rheumatoid arthritis of unspecified site: Secondary | ICD-10-CM

## 2022-10-28 DIAGNOSIS — C7951 Secondary malignant neoplasm of bone: Secondary | ICD-10-CM

## 2022-10-28 DIAGNOSIS — T451X5A Adverse effect of antineoplastic and immunosuppressive drugs, initial encounter: Secondary | ICD-10-CM

## 2022-10-28 LAB — CBC WITH DIFFERENTIAL/PLATELET
Abs Immature Granulocytes: 0.03 10*3/uL (ref 0.00–0.07)
Basophils Absolute: 0 10*3/uL (ref 0.0–0.1)
Basophils Relative: 0 %
Eosinophils Absolute: 0.1 10*3/uL (ref 0.0–0.5)
Eosinophils Relative: 1 %
HCT: 30.4 % — ABNORMAL LOW (ref 36.0–46.0)
Hemoglobin: 10.2 g/dL — ABNORMAL LOW (ref 12.0–15.0)
Immature Granulocytes: 0 %
Lymphocytes Relative: 28 %
Lymphs Abs: 2.6 10*3/uL (ref 0.7–4.0)
MCH: 30.2 pg (ref 26.0–34.0)
MCHC: 33.6 g/dL (ref 30.0–36.0)
MCV: 89.9 fL (ref 80.0–100.0)
Monocytes Absolute: 0.6 10*3/uL (ref 0.1–1.0)
Monocytes Relative: 7 %
Neutro Abs: 5.9 10*3/uL (ref 1.7–7.7)
Neutrophils Relative %: 64 %
Platelets: 299 10*3/uL (ref 150–400)
RBC: 3.38 MIL/uL — ABNORMAL LOW (ref 3.87–5.11)
RDW: 14 % (ref 11.5–15.5)
WBC: 9.3 10*3/uL (ref 4.0–10.5)
nRBC: 0 % (ref 0.0–0.2)

## 2022-10-28 LAB — COMPREHENSIVE METABOLIC PANEL
ALT: 19 U/L (ref 0–44)
AST: 28 U/L (ref 15–41)
Albumin: 4.3 g/dL (ref 3.5–5.0)
Alkaline Phosphatase: 62 U/L (ref 38–126)
Anion gap: 8 (ref 5–15)
BUN: 17 mg/dL (ref 6–20)
CO2: 21 mmol/L — ABNORMAL LOW (ref 22–32)
Calcium: 8.8 mg/dL — ABNORMAL LOW (ref 8.9–10.3)
Chloride: 107 mmol/L (ref 98–111)
Creatinine, Ser: 0.8 mg/dL (ref 0.44–1.00)
GFR, Estimated: >60 mL/min (ref >60)
Glucose, Bld: 103 mg/dL — ABNORMAL HIGH (ref 70–99)
Potassium: 3.3 mmol/L — ABNORMAL LOW (ref 3.5–5.1)
Sodium: 136 mmol/L (ref 135–145)
Total Bilirubin: 0.3 mg/dL (ref 0.3–1.2)
Total Protein: 7.4 g/dL (ref 6.5–8.1)

## 2022-10-28 MED ORDER — ZOLEDRONIC ACID 4 MG/5ML IV CONC
4.0000 mg | Freq: Once | INTRAVENOUS | Status: AC
  Administered 2022-10-28: 4 mg via INTRAVENOUS
  Filled 2022-10-28: qty 5

## 2022-10-28 MED ORDER — DIPHENHYDRAMINE HCL 25 MG PO CAPS
50.0000 mg | ORAL_CAPSULE | Freq: Once | ORAL | Status: AC
  Administered 2022-10-28: 50 mg via ORAL
  Filled 2022-10-28: qty 2

## 2022-10-28 MED ORDER — HEPARIN SOD (PORK) LOCK FLUSH 100 UNIT/ML IV SOLN
500.0000 [IU] | Freq: Once | INTRAVENOUS | Status: AC
  Administered 2022-10-28: 500 [IU] via INTRAVENOUS
  Filled 2022-10-28: qty 5

## 2022-10-28 MED ORDER — TRASTUZUMAB-ANNS CHEMO 420 MG IV SOLR
6.0000 mg/kg | Freq: Once | INTRAVENOUS | Status: AC
  Administered 2022-10-28: 462 mg via INTRAVENOUS
  Filled 2022-10-28: qty 22

## 2022-10-28 MED ORDER — ACETAMINOPHEN 325 MG PO TABS
650.0000 mg | ORAL_TABLET | Freq: Once | ORAL | Status: AC
  Administered 2022-10-28: 650 mg via ORAL
  Filled 2022-10-28: qty 2

## 2022-10-28 MED ORDER — ZOLEDRONIC ACID 4 MG/100ML IV SOLN: 4.0000 mg | Freq: Once | INTRAVENOUS | Status: DC

## 2022-10-28 MED ORDER — FULVESTRANT 250 MG/5ML IM SOSY
500.0000 mg | PREFILLED_SYRINGE | Freq: Once | INTRAMUSCULAR | Status: AC
  Administered 2022-10-28: 500 mg via INTRAMUSCULAR
  Filled 2022-10-28: qty 10

## 2022-10-28 MED ORDER — POTASSIUM CHLORIDE 20 MEQ/100ML IV SOLN
20.0000 meq | Freq: Once | INTRAVENOUS | Status: AC
  Administered 2022-10-28: 20 meq via INTRAVENOUS

## 2022-10-28 MED ORDER — SODIUM CHLORIDE 0.9 % IV SOLN
Freq: Once | INTRAVENOUS | Status: AC
  Filled 2022-10-28: qty 250

## 2022-10-28 MED ORDER — SODIUM CHLORIDE 0.9% FLUSH
10.0000 mL | Freq: Once | INTRAVENOUS | Status: AC
  Administered 2022-10-28: 10 mL via INTRAVENOUS
  Filled 2022-10-28: qty 10

## 2022-10-28 MED ORDER — SODIUM CHLORIDE 0.9 % IV SOLN
420.0000 mg | Freq: Once | INTRAVENOUS | Status: AC
  Administered 2022-10-28: 420 mg via INTRAVENOUS
  Filled 2022-10-28: qty 14

## 2022-10-28 NOTE — Assessment & Plan Note (Signed)
Chronic tachycardia Stable low-normal LVEF 50-55%. Follow up with cardiology. Repeat Echo in Jan 2024

## 2022-10-28 NOTE — Assessment & Plan Note (Signed)
-   Continue calcium supplementation. 

## 2022-10-28 NOTE — Assessment & Plan Note (Signed)
Follow up with  rheumatology.  Patient is on methotrexate,  plaquenil.  

## 2022-10-28 NOTE — Assessment & Plan Note (Signed)
Antibody treatment plan as list above. 

## 2022-10-28 NOTE — Progress Notes (Signed)
Hematology/Oncology Progress note [phone] [phone]    Name of the patient: Theresa Chaney  [id number]  1981-12-06   Date of visit: 10/28/22   ASSESSMENT & PLAN:   Cancer Staging  Breast cancer of upper-outer quadrant of right female breast [facility]) Staging form: Breast, AJCC 8th Edition - Clinical: G2, ER+, PR+, HER2- - Signed by Earlie Server, MD on 04/05/2018 - Pathologic stage from 04/04/2018: No Stage Recommended (ypT3, pN2, cM0, G3, ER+, PR-, HER2-) - Signed by Earlie Server, MD on 04/04/2018 - Pathologic: Stage IV (rpTX, pNX, cM1, ER+, PR-, HER2+) - Signed by Earlie Server, MD on 12/10/2020   Breast cancer of upper-outer quadrant of right female breast ([place]) Recurrent breast cancer with isolated bone metastasis, stage IV #Initially stage IIIA right breast cancer, ER positive, HER-2 negative status post bilateral oophorectomy,Right mastectomy and right axillary lymph node dissection, status post implant, and implant removal.  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 ER positive, HER-2 positive  .- s/p Palliative radiation to T7  Labs reviewed and discussed Proceed with Kanjinti Perjecta.   Continue fulvestrant monthly.  restaging PET- stable  Nonischemic cardiomyopathy (HCC)  Chronic tachycardia Stable low-normal LVEF 50-55%. Follow up with cardiology. Repeat Echo in Jan 2024  Neuropathy due to chemotherapeutic drug ([place]) continue Lyrica and nortriptyline symptoms stable  Rheumatoid arteritis ([place]) Follow up with  rheumatology.  Patient is on methotrexate,  plaquenil.   Encounter for monoclonal antibody treatment for malignancy Antibody treatment plan as list above.  Hypocalcemia Continue calcium supplementation  Malignant neoplasm metastatic to bone [facility]) status post radiation. Continue Zometa monthly, hold if Calcium is <8.6 Continue calcium supplementation. Continue  oxycodone as needed.    Orders Placed This Encounter  Procedures   Comprehensive metabolic panel    Standing Status:   Future    Standing Expiration Date:   12/30/2023   CBC with Differential    Standing Status:   Future    Standing Expiration Date:   12/30/2023   Follow up  lab [place] 3 weeks.  lab MD Kanjinti Perjecta 6 weeks.  fulvestrant/Zometa Q 4 weeks   All questions were answered. The patient knows to call the clinic with any problems, questions or concerns.  Earlie Server, MD, PhD [facility] Hematology Oncology 10/28/2022      REASON FOR VISIT Follow up for  treatment of breast cancer  Oncology History  Oncology History Overview Note        Malignant neoplasm of overlapping sites of right breast in female, estrogen receptor positive ([place])  Breast cancer of upper-outer quadrant of right female breast ([place])  10/19/2017 Initial Diagnosis   Malignant neoplasm of overlapping sites of right breast in female, estrogen receptor positive ([place])   11/08/2017 - 01/03/2018 Chemotherapy   Neoadjuvant ddAC x 4 + 1 cycle of Taxol  due to lack of response, surgery was offered.   03/19/2018 Surgery   S/p right mastectomy and right axillary dissection, immediate breast reconstruction with placement of expanders.  ypT3 ypN2, + lymphovascular invasion,  Grade 3, margin is negative, close. ER 90%, PR 0%, HER2 IHC negative.   # elective bilateral salpingo-oophorectomy..    06/11/2018 Imaging   s/p 11 cycles Taxol adjuvantly   10/10/2018 -  Radiation Therapy   adjuvant right chest wall radiation   06/03/2019 Surgery   underwent elective left prophylactic mastectomy and sentinel lymph node biopsy of left axilla Pathology negative for malignancy  #  Mediport removal  # right implant removal on    07/07/2020 Imaging   MRI thoracic spine without contrast showed lesions involving the T7 posterior elements most concerning for metastatic lesion.  No evidence of epidural tumor.   Minimal thoracic spondylosis without stenosis. MRI was reviewed by me and a PET scan was obtained for further evaluation   07/20/2020 Imaging   PET scan showed hypermetabolic metastasis involving the posterior element of T7, no additional evidence of metastasis in the neck, chest, abdomen or pelvis.   07/29/2020 Procedure   T7 lesion biopsy showed metastatic carcinoma, compatible with breast origin.  Receptor status staining showed ER 71-80% positive, PR negative, HER-2 positive IHC 3+   08/31/2020 -  Radiation Therapy    finished spine radiation    09/17/2020 -  Chemotherapy   Patient is on Treatment Plan :  BREAST Trastuzumab + Pertuzumab q21d     12/10/2020 Cancer Staging   Staging form: Breast, AJCC 8th Edition - Pathologic: Stage IV (rpTX, pNX, cM1, ER+, PR-, HER2+) - Signed by Earlie Server, MD on 12/10/2020   03/15/2021 Imaging    PET scan showed no focal hypermetabolic activity to suggest skeletal metastasis.  Mild hypermetabolic activity along the right T7-8 paraspinal musculature.  Max SUV 3.3.  Likely postprocedural-   09/28/2021 Echocardiogram   further decrease of LVEF to 45%   10/14/2021 Imaging   PET  Post bilateral mastectomy and RIGHT axillary dissection without signs of recurrent or metastatic disease.   11/12/2021 Echocardiogram   LVEF of 45-50%.   02/18/2022 Imaging   MRI thoracic and lumbar spine w wo contrast  1. Negative MRIs of the thoracic and lumbar spine. No evidence for locally recurrent metastasis at the level of T7. No other new metastatic disease elsewhere with thoracolumbar spine. 2. No significant disc pathology, stenosis, or evidence for neural impingement.    02/25/2022 Echocardiogram   LVEF 55-60%.   03/16/2022 Imaging   PET showed Stable PET-CT. No findings for local recurrent breast cancer,locoregional adenopathy or distant metastatic disease.    07/20/2022 Imaging   MRI Thoracic Spine w wo contrast 1. Interval resolution of the increased T2 signal  contrast enhancement in the posterior elements of T7. No new lesion identified. 2. No spinal canal or neural foraminal stenosis.   08/23/2022 Echocardiogram   Stable low-normal LVEF at 50-55%.   10/05/2022 Imaging   PET restaging Stable examination without evidence of hypermetabolic recurrence or metastatic disease.   Persistent minimally metabolic stranding in the posterior bilateral gluteal soft tissues may reflect sequela of subcutaneous injections      INTERVAL HISTORY 41 yo female with above oncology history reviewed by me presents for follow-up of management of metastatic breast cancer.  Chronic back pain, unchanged.   Intermittent diarrhea, manageable.  + upper and lower extremities cramp.      Review of Systems  Constitutional:  Negative for chills, fever, malaise/fatigue and weight loss.  HENT:  Negative for sore throat.   Eyes:  Negative for redness.  Respiratory:  Negative for cough, shortness of breath and wheezing.   Cardiovascular:  Negative for chest pain, palpitations and leg swelling.  Gastrointestinal:  Negative for abdominal pain, blood in stool, heartburn, nausea and vomiting.  Genitourinary:  Negative for dysuria.  Musculoskeletal:  Positive for back pain and joint pain. Negative for myalgias.  Skin:  Negative for rash.  Neurological:  Positive for tingling. Negative for dizziness and tremors.  Endo/Heme/Allergies:  Does not bruise/bleed easily.  Psychiatric/Behavioral:  Negative for  hallucinations. The patient does not have insomnia.     No Known Allergies  Patient Active Problem List   Diagnosis Date Noted   Breast cancer of upper-outer quadrant of right female breast ([place]) 03/19/2018    Priority: High   Thyroid nodule 06/04/2022    Priority: Medium    Hypokalemia 06/03/2022    Priority: Medium    Bone metastasis 03/25/2021    Priority: Medium    Encounter for monoclonal antibody treatment for malignancy 12/10/2020    Priority: Medium     Hypocalcemia 11/19/2020    Priority: Medium    Neuropathy due to chemotherapeutic drug ([place]) 08/11/2020    Priority: Medium    Rheumatoid arteritis ([place]) 10/13/2019    Priority: Medium    Nonischemic cardiomyopathy ([place]) 08/23/2018    Priority: Medium    Encounter for antineoplastic chemotherapy 10/29/2020    Priority: Low   Goals of care, counseling/discussion 08/11/2020    Priority: Low   Tachycardia, unspecified 04/15/2021   Bone lesion 11/19/2020   Inflammatory arthritis 11/19/2020   HER2-positive carcinoma of breast ([place]) 08/11/2020   Bilateral hand swelling 10/03/2019   Fracture of neck of metacarpal bone 05/14/2019   Chronic fatigue 04/09/2019   Polyarthralgia 04/09/2019   Status post right breast reconstruction 02/26/2019   Status post right mastectomy 02/26/2019   Mastalgia 02/15/2019   Shortness of breath 08/23/2018   Preprocedural cardiovascular examination 08/23/2018   Tachycardia 08/23/2018   Palpitations 08/23/2018   Estrogen receptor positive status (ER+) 04/04/2018   Acquired absence of right breast and nipple 04/03/2018   Family history of cancer    Malignant neoplasm of overlapping sites of right breast in female, estrogen receptor positive ([place]) 10/19/2017   Gastroesophageal reflux disease without esophagitis 02/24/2017   Generalized anxiety disorder 10/03/2014   Headache 10/03/2014     Past Medical History:  Diagnosis Date   Anemia    Arthritis    BRCA negative 11/26/2017   Breast cancer ([place]) 10/11/2017   Multifocal, ER positive, PR negative, HER-2 negative. ypT3 ypN2a 8.7 cm, 4/15 nodes   Cardiomyopathy ([place])    a. 10/2017 Echo: EF 60-65%, no rwma, Gr1 DD, nl RV size/fxn; b. 04/2018 Echo: EF 55-60%, no rwma, Nl RV size/fxn; c. 08/2018 Echo: EF 45%, diff HK, ? HK of antsept wall. Gr1 DD. Mild MR. Mild LAE/RAE. Mod dil RV.    Chronic bronchitis ([place]) 11/2017   COPD (chronic obstructive pulmonary disease) (HCC)    MILD PER CXR   Depression    Family  history of cancer    GERD (gastroesophageal reflux disease)    Headache    MIGRAINES   Heart murmur    ASYMPTOMATIC   Personal history of chemotherapy    current for right breast ca     Past Surgical History:  Procedure Laterality Date   AXILLARY LYMPH NODE DISSECTION Right 03/19/2018   Procedure: AXILLARY LYMPH NODE DISSECTION;  Surgeon: Robert Bellow, MD;  Location: [facility];  Service: General;  Laterality: Right;   BREAST BIOPSY Right 10/11/2017   12:30 posterior coil clip invasive mammary carcinoma   BREAST BIOPSY Right 10/11/2017   11:30 middle depth ribbon clip DCIS   BREAST BIOPSY Right 10/11/2017   5:30 anterior depth x shape invasive ductal carcinoma   BREAST IMPLANT REMOVAL Right 06/03/2019   Procedure: REMOVAL OF RIGHT BREAST IMPLANTS;  Surgeon: Wallace Going, DO;  Location: [facility];  Service: Plastics;  Laterality: Right;   BREAST RECONSTRUCTION WITH PLACEMENT OF TISSUE EXPANDER  AND FLEX HD (ACELLULAR HYDRATED DERMIS) Right 03/19/2018   Procedure: BREAST RECONSTRUCTION WITH PLACEMENT OF TISSUE EXPANDER AND FLEX HD (ACELLULAR HYDRATED DERMIS);  Surgeon: Wallace Going, DO;  Location: [facility];  Service: Plastics;  Laterality: Right;   CARPAL TUNNEL RELEASE Bilateral 2020   CHOLECYSTECTOMY N/A 04/27/2020   Procedure: LAPAROSCOPIC CHOLECYSTECTOMY WITH INTRAOPERATIVE CHOLANGIOGRAM;  Surgeon: Robert Bellow, MD;  Location: [facility];  Service: General;  Laterality: N/A;   ESOPHAGOGASTRODUODENOSCOPY (EGD) WITH PROPOFOL N/A 04/17/2020   Procedure: ESOPHAGOGASTRODUODENOSCOPY (EGD) WITH PROPOFOL;  Surgeon: Robert Bellow, MD;  Location: [facility];  Service: Endoscopy;  Laterality: N/A;  with biopsy   LAPAROSCOPIC BILATERAL SALPINGO OOPHERECTOMY Bilateral 03/19/2018   Procedure: LAPAROSCOPIC BILATERAL SALPINGO OOPHORECTOMY;  Surgeon: Benjaman Kindler, MD;  Location: [facility];  Service: Gynecology;  Laterality: Bilateral;   MASTECTOMY Right 03/2018   MASTECTOMY  W/ SENTINEL NODE BIOPSY Right 03/19/2018   Procedure: MASTECTOMY WITH SENTINEL LYMPH NODE BIOPSY;  Surgeon: Robert Bellow, MD;  Location: [place] [facility];  Service: General;  Laterality: Right;   PORT-A-CATH REMOVAL Left 06/03/2019   Procedure: REMOVAL PORT-A-CATH;  Surgeon: Robert Bellow, MD;  Location: [place] [facility];  Service: General;  Laterality: Left;   PORTACATH PLACEMENT Left 10/24/2017   Procedure: INSERTION PORT-A-CATH;  Surgeon: Robert Bellow, MD;  Location: [facility];  Service: General;  Laterality: Left;   PORTACATH PLACEMENT Right 09/28/2020   Procedure: INSERTION PORT-A-CATH;  Surgeon: Robert Bellow, MD;  Location: [facility];  Service: General;  Laterality: Right;   REMOVAL OF TISSUE EXPANDER AND PLACEMENT OF IMPLANT Right 07/20/2018   Procedure: REMOVAL OF RIGHT BREAST TISSUE EXPANDER AND PLACEMENT OF IMPLANT;  Surgeon: Wallace Going, DO;  Location: [place];  Service: Plastics;  Laterality: Right;   SIMPLE MASTECTOMY WITH AXILLARY SENTINEL NODE BIOPSY Left 06/03/2019   Procedure: SIMPLE MASTECTOMY LEFT;  Surgeon: Robert Bellow, MD;  Location: [facility];  Service: General;  Laterality: Left;    Social History   Socioeconomic History   Marital status: Married    Spouse name: Not on file   Number of children: Not on file   Years of education: Not on file   Highest education level: Not on file  Occupational History   Occupation: pharmacy tech    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   Tobacco Use   Smoking status: Every Day    Packs/day: 0.50    Years: 18.00    Total pack years: 9.00    Types: Cigarettes    Start date: 06/21/2018   Smokeless tobacco: Never  Vaping Use   Vaping Use: Never used  Substance and Sexual Activity   Alcohol use: No   Drug use: No   Sexual activity: Yes    Birth control/protection: Injection, Other-see comments    Comment: has had hysterectomy  Other Topics Concern   Not on file  Social History  Narrative   Lives at home with husband and daughter   Social Determinants of Health   Financial Resource Strain: Not on file  Food Insecurity: Not on file  Transportation Needs: Not on file  Physical Activity: Not on file  Stress: Not on file  Social Connections: Not on file  Intimate Partner Violence: Not on file     Family History  Problem Relation Age of Onset   Diabetes Father    Hypertension Father    Hyperlipidemia Father    Heart attack Father 37       "mild"  Melanoma Maternal Aunt        other aunts with BCC/SCC/Melanoma   Cervical cancer Maternal Aunt 26       daughter w/ cervical cancer as well   Lung cancer Maternal Aunt    Melanoma Maternal Uncle        other uncles with BCC/SCC/Melanoma   Breast cancer Paternal Aunt    Bladder Cancer Maternal Grandmother   Biological mother had Grave's disease.    Current Outpatient Medications:    acetaminophen (TYLENOL) 500 MG tablet, Take 500 mg by mouth every 6 (six) hours as needed., Disp: , Rfl:    albuterol (VENTOLIN HFA) 108 (90 Base) MCG/ACT inhaler, Inhale 2 puffs into the lungs every 6 (six) hours as needed for wheezing or shortness of breath. , Disp: , Rfl:    CALCIUM CARBONATE-VITAMIN D PO, Take 600-800 mg by mouth in the morning, at noon, and at bedtime., Disp: , Rfl:    chlorhexidine (PERIDEX) 0.12 % solution, Use as directed 15 mLs in the mouth or throat 2 (two) times daily., Disp: 120 mL, Rfl: 0   cyanocobalamin (,VITAMIN B-12,) 1000 MCG/ML injection, Inject 1,000 mcg into the skin every 30 (thirty) days., Disp: , Rfl:    cyclobenzaprine (FLEXERIL) 5 MG tablet, Take 5 mg by mouth 3 (three) times daily as needed for muscle spasms., Disp: , Rfl:    diphenoxylate-atropine (LOMOTIL) 2.5-0.025 MG tablet, Take 1 tablet by mouth 4 (four) times daily as needed for diarrhea or loose stools., Disp: 60 tablet, Rfl: 0   escitalopram (LEXAPRO) 20 MG tablet, Take 20 mg by mouth daily., Disp: , Rfl:    esomeprazole (NEXIUM)  40 MG capsule, Take 40 mg by mouth daily before breakfast. , Disp: , Rfl:    Eszopiclone 3 MG TABS, Take 3 mg by mouth at bedtime as needed (sleep). , Disp: , Rfl:    folic acid (FOLVITE) 1 MG tablet, Take 1 mg by mouth daily., Disp: , Rfl:    hydroxychloroquine (PLAQUENIL) 200 MG tablet, Take 200 mg by mouth daily., Disp: , Rfl:    ibuprofen (ADVIL) 800 MG tablet, Take 800 mg by mouth every 8 (eight) hours as needed for moderate pain., Disp: , Rfl:    loperamide (IMODIUM) 2 MG capsule, Take 1 tablet (2 mg total) by mouth See admin instructions. Take 2 tablets with onset of diarrhea, then take 1 tablet every 2 hours until diarrhea stops. Maximum 8 tablets in 24, Disp: 90 capsule, Rfl: 0   loratadine (CLARITIN) 10 MG tablet, Take 10 mg by mouth daily. , Disp: , Rfl:    LORazepam (ATIVAN) 1 MG tablet, Take 1 mg by mouth 3 (three) times daily., Disp: , Rfl:    Magnesium 500 MG TABS, Take 500 mg by mouth 2 (two) times daily., Disp: , Rfl:    methotrexate (RHEUMATREX) 2.5 MG tablet, Take 25 mg by mouth every Sunday. 10 tablets once a week, Disp: , Rfl:    metoprolol succinate (TOPROL XL) 25 MG 24 hr tablet, Take 0.5 tablets (12.5 mg total) by mouth daily., Disp: 45 tablet, Rfl: 3   mupirocin ointment (BACTROBAN) 2 %, Place 1 application into the nose 2 (two) times daily. Use in each nostril twice daily for five (5) days., Disp: 22 g, Rfl: 5   nortriptyline (PAMELOR) 10 MG capsule, Take 30 mg by mouth at bedtime. , Disp: , Rfl:    oxyCODONE (OXY IR/ROXICODONE) 5 MG immediate release tablet, Take 1 tablet (5 mg total) by mouth  every 6 (six) hours as needed for moderate pain or severe pain., Disp: 90 tablet, Rfl: 0   phentermine (ADIPEX-P) 37.5 MG tablet, Take 37.5 mg by mouth daily before breakfast. , Disp: , Rfl:    pregabalin (LYRICA) 150 MG capsule, Take 150 mg by mouth 2 (two) times daily., Disp: , Rfl:    promethazine (PHENERGAN) 25 MG tablet, Take 1 tablet (25 mg total) by mouth every 8 (eight) hours  as needed for nausea or vomiting., Disp: 90 tablet, Rfl: 0   pyridOXINE (VITAMIN B-6) 100 MG tablet, Take 100 mg by mouth daily., Disp: , Rfl:    sulfaSALAzine (AZULFIDINE) 500 MG EC tablet, Take by mouth., Disp: , Rfl:    topiramate (TOPAMAX) 50 MG tablet, Take 50 mg by mouth 2 (two) times daily. , Disp: , Rfl:    vitamin C (ASCORBIC ACID) 500 MG tablet, Take 500 mg by mouth daily., Disp: , Rfl:    lidocaine-prilocaine (EMLA) cream, Apply to port and cover 1-2 hours prior to appointment (Patient not taking: Reported on 03/04/2022), Disp: 30 g, Rfl: 3   losartan (COZAAR) 25 MG tablet, Take 0.5 tablets (12.5 mg total) by mouth daily. (Patient not taking: Reported on 07/15/2022), Disp: 45 tablet, Rfl: 3 No current facility-administered medications for this visit.  Facility-Administered Medications Ordered in Other Visits:    heparin lock flush 100 unit/mL, 500 Units, Intravenous, Once, Earlie Server, MD   sodium chloride flush (NS) 0.9 % injection 10 mL, 10 mL, Intravenous, PRN, Earlie Server, MD, 10 mL at 11/19/18 1249   sodium chloride flush (NS) 0.9 % injection 10 mL, 10 mL, Intravenous, Once, Earlie Server, MD   Physical exam:  Vitals:   10/28/22 1019  BP: 121/74  Pulse: (!) 102  Temp: 98.5 F (36.9 C)  TempSrc: Tympanic  SpO2: 99%  Weight: 175 lb 11.2 oz (79.7 kg)  ECOG 1 Physical Exam Constitutional:      General: She is not in acute distress.    Appearance: She is not diaphoretic.  HENT:     Head: Normocephalic and atraumatic.     Nose: Nose normal.  Eyes:     General: No scleral icterus. Neck:     Vascular: No JVD.  Cardiovascular:     Rate and Rhythm: Normal rate.  Pulmonary:     Effort: Pulmonary effort is normal. No respiratory distress.     Breath sounds: Normal breath sounds. No wheezing.  Abdominal:     General: There is no distension.     Palpations: Abdomen is soft.  Musculoskeletal:        General: Normal range of motion.     Cervical back: Normal range of motion.   Lymphadenopathy:     Cervical: No cervical adenopathy.  Skin:    General: Skin is warm.     Findings: No erythema or rash.  Neurological:     Mental Status: She is alert and oriented to person, place, and time. Mental status is at baseline.     Cranial Nerves: No cranial nerve deficit.     Motor: No abnormal muscle tone.     Coordination: Coordination normal.  Psychiatric:        Mood and Affect: Mood and affect normal.        Labs     Latest Ref Rng & Units 10/28/2022   10:12 AM  CMP  Glucose 70 - 99 mg/dL 103   BUN 6 - 20 mg/dL 17   Creatinine 0.44 - 1.00  mg/dL 0.80   Sodium 135 - 145 mmol/L 136   Potassium 3.5 - 5.1 mmol/L 3.3   Chloride 98 - 111 mmol/L 107   CO2 22 - 32 mmol/L 21   Calcium 8.9 - 10.3 mg/dL 8.8   Total Protein 6.5 - 8.1 g/dL 7.4   Total Bilirubin 0.3 - 1.2 mg/dL 0.3   Alkaline Phos 38 - 126 U/L 62   AST 15 - 41 U/L 28   ALT 0 - 44 U/L 19       Latest Ref Rng & Units 10/28/2022   10:12 AM  CBC  WBC 4.0 - 10.5 K/uL 9.3   Hemoglobin 12.0 - 15.0 g/dL 10.2   Hematocrit 36.0 - 46.0 % 30.4   Platelets 150 - 400 K/uL 299    RADIOGRAPHIC STUDIES: I have personally reviewed the radiological images as listed and agreed with the findings in the report. NM PET Image Restag (PS) Skull Base To Thigh  Result Date: 10/06/2022 CLINICAL DATA:  Subsequent treatment strategy for breast cancer. EXAM: NUCLEAR MEDICINE PET SKULL BASE TO THIGH TECHNIQUE: 9.82 mCi F-18 FDG was injected intravenously. Full-ring PET imaging was performed from the skull base to thigh after the radiotracer. CT data was obtained and used for attenuation correction and anatomic localization. Fasting blood glucose: 96 mg/dl COMPARISON:  Multiple priors including most recent PET-CT March 16, 2022 FINDINGS: Mediastinal blood pool activity: SUV max 1.5 Liver activity: SUV max NA NECK: No hypermetabolic cervical adenopathy. Incidental CT findings: None. CHEST: No hypermetabolic thoracic  adenopathy. No hypermetabolic pulmonary nodules or masses. Status post bilateral mastectomies without hypermetabolic chest wall lesions to suggest recurrent breast cancer. Incidental CT findings: Right chest Port-A-Cath with tip in the right atrium. ABDOMEN/PELVIS: No abnormal hypermetabolic activity within the liver, pancreas, adrenal glands, or spleen. No hypermetabolic lymph nodes in the abdomen or pelvis. Incidental CT findings: None. SKELETON: No focal hypermetabolic activity to suggest skeletal metastasis. Persistent minimally metabolic stranding in the bilateral posterior gluteal soft tissues with a max SUV of 1.4. Incidental CT findings: None. IMPRESSION: Stable examination without evidence of hypermetabolic recurrence or metastatic disease. Persistent minimally metabolic stranding in the posterior bilateral gluteal soft tissues may reflect sequela of subcutaneous injections. Electronically Signed   By: Dahlia Bailiff M.D.   On: 10/06/2022 17:14   ECHOCARDIOGRAM LIMITED  Result Date: 08/23/2022    ECHOCARDIOGRAM LIMITED REPORT   Patient Name:   KAITLYNE FRIEDHOFF Date of Exam: 08/23/2022 Medical Rec #:  [id number]      Height:       67.0 in Accession #:    [id number]     Weight:       173.5 lb Date of Birth:  08/08/1981      BSA:          1.904 m Patient Age:    41 years       BP:           114/84 mmHg Patient Gender: F              HR:           88 bpm. Exam Location:  [place] Procedure: Limited Echo, Limited Color Doppler and Strain Analysis Indications:    I42.80 Non-ischemic cardiomyopathy  History:        Patient has prior history of Echocardiogram examinations, most                 recent 05/17/2022. Cardiomyopathy, COPD, Arrythmias:Tachycardia,  Signs/Symptoms:Shortness of Breath and Murmur; Risk Factors:h/o                 chemotherapy and Current Smoker.  Sonographer:    Pilar Jarvis RDMS, RVT, RDCS Referring Phys: [id number] [place]  Sonographer Comments: Last chemo round  was 3 weeks ago IMPRESSIONS  1. Left ventricular ejection fraction, by estimation, is 50 to 55%. The left ventricle has low normal function. The left ventricle has no regional wall motion abnormalities. Left ventricular diastolic parameters are consistent with Grade I diastolic dysfunction (impaired relaxation). The average left ventricular global longitudinal strain is -15.4 %.  2. Right ventricular systolic function is normal. The right ventricular size is normal.  3. The mitral valve is normal in structure. Trivial mitral valve regurgitation. No evidence of mitral stenosis.  4. The aortic valve was not well visualized. Aortic valve regurgitation is not visualized. No aortic stenosis is present.  5. The inferior vena cava is normal in size with greater than 50% respiratory variability, suggesting right atrial pressure of 3 mmHg. FINDINGS  Left Ventricle: Left ventricular ejection fraction, by estimation, is 50 to 55%. The left ventricle has low normal function. The left ventricle has no regional wall motion abnormalities. The average left ventricular global longitudinal strain is -15.4 %. The left ventricular internal cavity size was normal in size. There is no left ventricular hypertrophy. Left ventricular diastolic parameters are consistent with Grade I diastolic dysfunction (impaired relaxation). Right Ventricle: The right ventricular size is normal. No increase in right ventricular wall thickness. Right ventricular systolic function is normal. Left Atrium: Left atrial size was normal in size. Right Atrium: Right atrial size was normal in size. Pericardium: There is no evidence of pericardial effusion. Mitral Valve: The mitral valve is normal in structure. Trivial mitral valve regurgitation. No evidence of mitral valve stenosis. Tricuspid Valve: The tricuspid valve is normal in structure. Tricuspid valve regurgitation is not demonstrated. No evidence of tricuspid stenosis. Aortic Valve: The aortic valve was not  well visualized. Aortic valve regurgitation is not visualized. No aortic stenosis is present. Pulmonic Valve: The pulmonic valve was normal in structure. Pulmonic valve regurgitation is not visualized. No evidence of pulmonic stenosis. Aorta: The aortic root is normal in size and structure. Venous: The inferior vena cava is normal in size with greater than 50% respiratory variability, suggesting right atrial pressure of 3 mmHg. IAS/Shunts: No atrial level shunt detected by color flow Doppler. LEFT VENTRICLE PLAX 2D LVIDd:         5.10 cm      Diastology LVIDs:         3.60 cm      LV e' medial:    8.92 cm/s LV PW:         0.60 cm      LV E/e' medial:  7.0 LV IVS:        0.60 cm      LV e' lateral:   13.70 cm/s                             LV E/e' lateral: 4.6  LV Volumes (MOD)            2D Longitudinal Strain LV vol d, MOD A2C: 90.8 ml  2D Strain GLS Avg:     -15.4 % LV vol d, MOD A4C: 100.0 ml LV vol s, MOD A2C: 44.5 ml LV vol s, MOD A4C: 49.9 ml LV SV  MOD A2C:     46.3 ml  3D Volume EF: LV SV MOD A4C:     100.0 ml 3D EF:        49 % LV SV MOD BP:      47.3 ml  LV EDV:       158 ml                             LV ESV:       81 ml                             LV SV:        77 ml RIGHT VENTRICLE RV S prime:     12.50 cm/s TAPSE (M-mode): 2.4 cm MITRAL VALVE MV Area (PHT): 2.95 cm MV Decel Time: 257 msec MV E velocity: 62.60 cm/s MV A velocity: 91.30 cm/s MV E/A ratio:  0.69 Ida Rogue MD Electronically signed by Ida Rogue MD Signature Date/Time: 08/23/2022/1:20:22 PM    Final

## 2022-10-28 NOTE — Assessment & Plan Note (Signed)
continue Lyrica and nortriptyline symptoms stable 

## 2022-10-28 NOTE — Assessment & Plan Note (Addendum)
Recurrent breast cancer with isolated bone metastasis, stage IV #Initially stage IIIA right breast cancer, ER positive, HER-2 negative status post bilateral oophorectomy,Right mastectomy and right axillary lymph node dissection, status post implant, and implant removal.  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 ER positive, HER-2 positive  .- s/p Palliative radiation to T7  Labs reviewed and discussed Proceed with Kanjinti Perjecta.   Continue fulvestrant monthly.  restaging PET- stable 

## 2022-10-28 NOTE — Patient Instructions (Signed)
[facility] AT [place]  Discharge Instructions: Thank you for choosing [facility] to provide your oncology and hematology care.  If you have a lab appointment with the [facility], please go directly to the [facility] and check in at the registration area.  Wear comfortable clothing and clothing appropriate for easy access to any Portacath or PICC line.   We strive to give you quality time with your provider. You may need to reschedule your appointment if you arrive late (15 or more minutes).  Arriving late affects you and other patients whose appointments are after yours.  Also, if you miss three or more appointments without notifying the office, you may be dismissed from the clinic at the provider's discretion.      For prescription refill requests, have your pharmacy contact our office and allow 72 hours for refills to be completed.    Today you received the following chemotherapy and/or immunotherapy agents Faslodex, Kanjinti, Perjeta, Zometa, Potassium.      To help prevent nausea and vomiting after your treatment, we encourage you to take your nausea medication as directed.  BELOW ARE SYMPTOMS THAT SHOULD BE REPORTED IMMEDIATELY: *FEVER GREATER THAN 100.4 F (38 C) OR HIGHER *CHILLS OR SWEATING *NAUSEA AND VOMITING THAT IS NOT CONTROLLED WITH YOUR NAUSEA MEDICATION *UNUSUAL SHORTNESS OF BREATH *UNUSUAL BRUISING OR BLEEDING *URINARY PROBLEMS (pain or burning when urinating, or frequent urination) *BOWEL PROBLEMS (unusual diarrhea, constipation, pain near the anus) TENDERNESS IN MOUTH AND THROAT WITH OR WITHOUT PRESENCE OF ULCERS (sore throat, sores in mouth, or a toothache) UNUSUAL RASH, SWELLING OR PAIN  UNUSUAL VAGINAL DISCHARGE OR ITCHING   Items with * indicate a potential emergency and should be followed up as soon as possible or go to the Emergency Department if any problems should occur.  Please show the CHEMOTHERAPY ALERT CARD or  IMMUNOTHERAPY ALERT CARD at check-in to the Emergency Department and triage nurse.  Should you have questions after your visit or need to cancel or reschedule your appointment, please contact [facility] [place] AT [place]  [phone] and follow the prompts.  Office hours are 8:00 a.m. to 4:30 p.m. Monday - Friday. Please note that voicemails left after 4:00 p.m. may not be returned until the following business day.  We are closed weekends and major holidays. You have access to a nurse at all times for urgent questions. Please call the main number to the clinic [phone] and follow the prompts.  For any non-urgent questions, you may also contact your provider using MyChart. We now offer e-Visits for anyone 65 and older to request care online for non-urgent symptoms. For details visit [email or web address].   Also download the MyChart app! Go to the app store, search "MyChart", open the app, select [facility], and log in with your MyChart username and password.  Masks are optional in the cancer centers. If you would like for your care team to wear a mask while they are taking care of you, please let them know. For doctor visits, patients may have with them one support person who is at least 39 years old. At this time, visitors are not allowed in the infusion area.

## 2022-10-28 NOTE — Assessment & Plan Note (Signed)
status post radiation. Continue Zometa monthly, hold if Calcium is <8.6 Continue calcium supplementation. Continue oxycodone as needed.   

## 2022-11-01 ENCOUNTER — Other Ambulatory Visit: Payer: Self-pay | Admitting: Physician Assistant

## 2022-11-01 NOTE — Telephone Encounter (Signed)
Please schedule overdue F/U appt for refills. Thank you! 

## 2022-11-02 NOTE — Telephone Encounter (Signed)
Scheduled 12/15

## 2022-11-14 NOTE — Progress Notes (Signed)
Cardiology Office Note    Date:  11/25/2022   ID:  Theresa Chaney, Theresa Chaney [birth year], MRN [id number]  PCP:  Marguerita Merles, MD  Cardiologist:  Nelva Bush, MD  Electrophysiologist:  None   Chief Complaint: Follow-up  History of Present Illness:   Theresa Chaney is a 41 y.o. female with history of recurrent breast cancer with isolated bone metastasis s/p mastectomy and chemoradiation complicated by a mildly reduced LVEF, WCT, paroxysmal SVT, lymphedema, COPD, anemia, migraine disorder, and GERD who presents for follow up of echo.   She was previously evaluated in 10/2017 for intermittent sinus tachycardia and DOE. At that time echoes in 10/2017 and 04/2018 showed normal LV function and it was ultimately felt her symptoms were secondary to chemotherapy. In 08/2018, she experienced upper extremity swelling with repeat echo at that time showing an EF of 45% with question of anteroseptal hypokinesis and moderate right ventricular enlargement. Upon her primary cardiologist reviewing the echo, it was felt her EF was closer to 50-55%. She was placed on Toprol XL. Lexiscan Myoview in 10/2018 showed a medium defect of moderate severity present in the the basal anterior and mild anterior location, EF 55-65%. This was a very suboptimal study due to breast attenuation artifact as well as extracardiac uptake at the inferior border of the heart. There was reversible anterior wall defect suggestive of ischemia, though it was felt this was likely artifact given the defect did not extend to the apex. She was seen in the office in 11/2018 for follow up and was feeling well outside of intermittent palpitations over the prior couple of weeks with report of heart rates of 150 bpm. Episodes would typically last a few minutes. In this setting, she wore a Zio monitor that showed the predominant rhythm was sinus with an average heart rate of 82 bpm (range 48-175 bpm). Rare PACs and PVCs were noted with a single atrial run  lasting 4 beats. There were no sustained arrhythmias or prolonged pauses. Patient triggered events corresponded to sinus rhythm and artifact. In this setting, her Toprol XL was increased to 25 mg. She underwent echo on 02/19/2019 which showed improvement in her EF to 67-89%, normal diastolic function, and no significant valvular abnormalities. She was seen in the office in 02/2019 for presyncope. Repeat cardiac monitoring at that time showed a predominant rhythm of sinus with an average heart rate of 88 bpm (range 47-181 bpm in sinus), 2 episodes of WCT favored to be NSVT over SVT with aberrancy lasting up to 7 beats, and rare PACs/PVCs. She has continued to undergo periodic echoes for chemotherapy evaluation as outlined below.    She has undergone periodic echocardiograms for chemotherapy monitoring which have demonstrated:      Echo in 10/2017 demonstrated an EF of 60 to 65%, normal wall motion, grade 1 diastolic dysfunction, normal RV systolic function, normal PASP, and no significant valvular abnormalities.   Echo in 04/2018 showed an EF of 55 to 60%, normal wall motion, normal LV diastolic function.   Echo in 08/2018 showed an EF of 45%, diffuse hypokinesis, grade 1 diastolic dysfunction, mild mitral regurgitation, mild biatrial enlargement, and moderately dilated RV.   Echo in 01/2020 demonstrated an EF of 60 to 65%, no regional wall motion abnormalities, normal RV systolic function and ventricular cavity size, and no significant valvular abnormalities.     Echo in 09/2020 continues to show a preserved LV systolic function with an EF of 60 to 38%, grade 1 diastolic dysfunction,  normal RV systolic function and ventricular cavity size, and no significant valvular abnormalities.     Echo in 01/2021 demonstrated an EF of 55-60%, no RWMA, normal RV systolic function and ventricular cavity size, and no significant valvular abnormalities.    Echo in 04/2021 showed an EF of 55 to 60%, no regional wall motion  abnormalities, normal RV systolic function and ventricular cavity size, and no significant valvular abnormalities.   Echo in 08/2021 showed a low normal LV systolic function with an EF of 50 to 55%, normal LV diastolic function parameters, normal RV systolic function and ventricular cavity size, and no significant valvular abnormalities.     Echo from 09/28/2021 demonstrated an EF of 45%, global hypokinesis, normal RV systolic function and ventricular cavity size, and trivial mitral regurgitation.     Following noted drop in LV systolic function, trastuzumab and pertuzumab were held.  She was seen in follow-up in 09/2021 with relative hypotension precluding escalation of GDMT.  She underwent repeat echo in 11/2021 which demonstrated an EF of 45 to 50%, normal RV systolic function and ventricular cavity size, and no significant valvular abnormalities.  Given persistent mild LV dysfunction, Toprol was decreased to 12.5 mg and she was initiated on losartan 12.5 mg.  Subsequent repeat echo on 02/25/2022 demonstrated an improvement in her LV systolic function with an EF of 55 to 60%, no regional wall motion abnormalities, grade 1 diastolic dysfunction, normal RV systolic function and ventricular cavity size, no significant valvular abnormalities, and an estimated right atrial pressure of 3 mmHg.  She was last seen in the office in 02/2022 and was without symptoms of angina or decompensation.  She did continue to note fatigue and positional dizziness that were largely stable.  Echo in 05/2022 showed an EF of 50 to 55%, no regional wall motion abnormalities, normal RV systolic function and ventricular cavity size, no significant valvular abnormalities, and an estimated right atrial pressure of 3 mmHg.  With noted lightheadedness/dizziness was subsequently recommended she hold losartan in 06/2022.  Echo from 08/2022 55 to 55%, no regional wall motion abnormalities, grade 1 diastolic dysfunction, normal RV systolic  function and ventricular cavity size, trivial mitral regurgitation, and an estimated right atrial pressure of 3 mmHg.  Most recent PET scan from 09/2022 was stable without evidence of hypermetabolic recurrence or metastatic disease.  Echo from 11/2022 showed an EF of 50-55%, no regional wall motion abnormalities, indeterminate diastolic function, normal RV systolic function and ventricular cavity size, no significant valvular abnormalities, and an estimated right atrial pressure of 3 mmHg.   She comes in doing reasonably well from a cardiac perspective.  She is without symptoms of angina or decompensation.  No significant dyspnea.  She does continue to note stable fatigue and positional dizziness.  Dizziness is improved following discontinuation of losartan.  She does not report any significantly high or low blood pressure readings.  Her weight is down 14 pounds by our scale when compared to her last visit with [place] in 02/2022.  She reports a good appetite.  No significant lower extremity swelling or progressive orthopnea.   Labs independently reviewed: 10/2022 - potassium 3.3, BUN 17, serum creatinine 0.8, albumin 4.3, AST/ALT normal, Hgb 10.2, PLT 299 07/2022 - magnesium 2.1 07/2021 - TSH normal  Past Medical History:  Diagnosis Date   Anemia    Arthritis    BRCA negative 11/26/2017   Breast cancer ([place]) 10/11/2017   Multifocal, ER positive, PR negative, HER-2 negative. ypT3 ypN2a 8.7 cm, 4/15  nodes   Cardiomyopathy ([place])    a. 10/2017 Echo: EF 60-65%, no rwma, Gr1 DD, nl RV size/fxn; b. 04/2018 Echo: EF 55-60%, no rwma, Nl RV size/fxn; c. 08/2018 Echo: EF 45%, diff HK, ? HK of antsept wall. Gr1 DD. Mild MR. Mild LAE/RAE. Mod dil RV.    Chronic bronchitis ([place]) 11/2017   COPD (chronic obstructive pulmonary disease) (H[facility])    MILD PER CXR   Depression    Family history of cancer    GERD (gastroesophageal reflux disease)    Headache    MIGRAINES   Heart murmur    ASYMPTOMATIC   Personal  history of chemotherapy    current for right breast ca    Past Surgical History:  Procedure Laterality Date   AXILLARY LYMPH NODE DISSECTION Right 03/19/2018   Procedure: AXILLARY LYMPH NODE DISSECTION;  Surgeon: Robert Bellow, MD;  Location: [facility];  Service: General;  Laterality: Right;   BREAST BIOPSY Right 10/11/2017   12:30 posterior coil clip invasive mammary carcinoma   BREAST BIOPSY Right 10/11/2017   11:30 middle depth ribbon clip DCIS   BREAST BIOPSY Right 10/11/2017   5:30 anterior depth x shape invasive ductal carcinoma   BREAST IMPLANT REMOVAL Right 06/03/2019   Procedure: REMOVAL OF RIGHT BREAST IMPLANTS;  Surgeon: Wallace Going, DO;  Location: [facility];  Service: Plastics;  Laterality: Right;   BREAST RECONSTRUCTION WITH PLACEMENT OF TISSUE EXPANDER AND FLEX HD (ACELLULAR HYDRATED DERMIS) Right 03/19/2018   Procedure: BREAST RECONSTRUCTION WITH PLACEMENT OF TISSUE EXPANDER AND FLEX HD (ACELLULAR HYDRATED DERMIS);  Surgeon: Wallace Going, DO;  Location: [facility];  Service: Plastics;  Laterality: Right;   CARPAL TUNNEL RELEASE Bilateral 2020   CHOLECYSTECTOMY N/A 04/27/2020   Procedure: LAPAROSCOPIC CHOLECYSTECTOMY WITH INTRAOPERATIVE CHOLANGIOGRAM;  Surgeon: Robert Bellow, MD;  Location: [facility];  Service: General;  Laterality: N/A;   ESOPHAGOGASTRODUODENOSCOPY (EGD) WITH PROPOFOL N/A 04/17/2020   Procedure: ESOPHAGOGASTRODUODENOSCOPY (EGD) WITH PROPOFOL;  Surgeon: Robert Bellow, MD;  Location: [facility];  Service: Endoscopy;  Laterality: N/A;  with biopsy   LAPAROSCOPIC BILATERAL SALPINGO OOPHERECTOMY Bilateral 03/19/2018   Procedure: LAPAROSCOPIC BILATERAL SALPINGO OOPHORECTOMY;  Surgeon: Benjaman Kindler, MD;  Location: [facility];  Service: Gynecology;  Laterality: Bilateral;   MASTECTOMY Right 03/2018   MASTECTOMY W/ SENTINEL NODE BIOPSY Right 03/19/2018   Procedure: MASTECTOMY WITH SENTINEL LYMPH NODE BIOPSY;  Surgeon: Robert Bellow, MD;   Location: [facility];  Service: General;  Laterality: Right;   PORT-A-CATH REMOVAL Left 06/03/2019   Procedure: REMOVAL PORT-A-CATH;  Surgeon: Robert Bellow, MD;  Location: [facility];  Service: General;  Laterality: Left;   PORTACATH PLACEMENT Left 10/24/2017   Procedure: INSERTION PORT-A-CATH;  Surgeon: Robert Bellow, MD;  Location: [facility];  Service: General;  Laterality: Left;   PORTACATH PLACEMENT Right 09/28/2020   Procedure: INSERTION PORT-A-CATH;  Surgeon: Robert Bellow, MD;  Location: [facility];  Service: General;  Laterality: Right;   REMOVAL OF TISSUE EXPANDER AND PLACEMENT OF IMPLANT Right 07/20/2018   Procedure: REMOVAL OF RIGHT BREAST TISSUE EXPANDER AND PLACEMENT OF IMPLANT;  Surgeon: Wallace Going, DO;  Location: [facility];  Service: Plastics;  Laterality: Right;   SIMPLE MASTECTOMY WITH AXILLARY SENTINEL NODE BIOPSY Left 06/03/2019   Procedure: SIMPLE MASTECTOMY LEFT;  Surgeon: Robert Bellow, MD;  Location: [facility];  Service: General;  Laterality: Left;    Current Medications: Current Meds  Medication Sig   acetaminophen (TYLENOL) 500 MG tablet  Take 500 mg by mouth every 6 (six) hours as needed.   albuterol (VENTOLIN HFA) 108 (90 Base) MCG/ACT inhaler Inhale 2 puffs into the lungs every 6 (six) hours as needed for wheezing or shortness of breath.    CALCIUM CARBONATE-VITAMIN D PO Take 600-800 mg by mouth in the morning, at noon, and at bedtime.   chlorhexidine (PERIDEX) 0.12 % solution Use as directed 15 mLs in the mouth or throat 2 (two) times daily.   cyanocobalamin (,VITAMIN B-12,) 1000 MCG/ML injection Inject 1,000 mcg into the skin every 30 (thirty) days.   cyclobenzaprine (FLEXERIL) 5 MG tablet Take 5 mg by mouth 3 (three) times daily as needed for muscle spasms.   diphenoxylate-atropine (LOMOTIL) 2.5-0.025 MG tablet Take 1 tablet by mouth 4 (four) times daily as needed for diarrhea or loose stools.   escitalopram (LEXAPRO) 20 MG  tablet Take 20 mg by mouth daily.   esomeprazole (NEXIUM) 40 MG capsule Take 40 mg by mouth daily before breakfast.    Eszopiclone 3 MG TABS Take 3 mg by mouth at bedtime as needed (sleep).    folic acid (FOLVITE) 1 MG tablet Take 1 mg by mouth daily.   hydroxychloroquine (PLAQUENIL) 200 MG tablet Take 200 mg by mouth daily.   ibuprofen (ADVIL) 800 MG tablet Take 800 mg by mouth every 8 (eight) hours as needed for moderate pain.   loperamide (IMODIUM) 2 MG capsule Take 1 tablet (2 mg total) by mouth See admin instructions. Take 2 tablets with onset of diarrhea, then take 1 tablet every 2 hours until diarrhea stops. Maximum 8 tablets in 24   loratadine (CLARITIN) 10 MG tablet Take 10 mg by mouth daily.    LORazepam (ATIVAN) 1 MG tablet Take 1 mg by mouth 3 (three) times daily.   Magnesium 500 MG TABS Take 500 mg by mouth 2 (two) times daily.   methotrexate (RHEUMATREX) 2.5 MG tablet Take 25 mg by mouth every Sunday. 10 tablets once a week   mupirocin ointment (BACTROBAN) 2 % Place 1 application into the nose 2 (two) times daily. Use in each nostril twice daily for five (5) days.   nortriptyline (PAMELOR) 10 MG capsule Take 30 mg by mouth at bedtime.    oxyCODONE (OXY IR/ROXICODONE) 5 MG immediate release tablet Take 1 tablet (5 mg total) by mouth every 6 (six) hours as needed for moderate pain or severe pain.   phentermine (ADIPEX-P) 37.5 MG tablet Take 37.5 mg by mouth daily before breakfast.    predniSONE (DELTASONE) 5 MG tablet Take 5 mg by mouth daily with breakfast.   pregabalin (LYRICA) 150 MG capsule Take 150 mg by mouth 2 (two) times daily.   promethazine (PHENERGAN) 25 MG tablet Take 1 tablet (25 mg total) by mouth every 8 (eight) hours as needed for nausea or vomiting.   pyridOXINE (VITAMIN B-6) 100 MG tablet Take 100 mg by mouth daily.   topiramate (TOPAMAX) 50 MG tablet Take 50 mg by mouth 2 (two) times daily.    vitamin C (ASCORBIC ACID) 500 MG tablet Take 500 mg by mouth daily.    [DISCONTINUED] metoprolol succinate (TOPROL XL) 25 MG 24 hr tablet Take 0.5 tablets (12.5 mg total) by mouth daily.    Allergies:   Patient has no known allergies.   Social History   Socioeconomic History   Marital status: Married    Spouse name: Not on file   Number of children: Not on file   Years of education: Not on  file   Highest education level: Not on file  Occupational History   Occupation: pharmacy tech    Comment: [employer]   Tobacco Use   Smoking status: Every Day    Packs/day: 0.50    Years: 18.00    Total pack years: 9.00    Types: Cigarettes    Start date: 06/21/2018   Smokeless tobacco: Never  Vaping Use   Vaping Use: Never used  Substance and Sexual Activity   Alcohol use: No   Drug use: No   Sexual activity: Yes    Birth control/protection: Injection, Other-see comments    Comment: has had hysterectomy  Other Topics Concern   Not on file  Social History Narrative   Lives at home with husband and daughter   Social Determinants of Health   Financial Resource Strain: Not on file  Food Insecurity: Not on file  Transportation Needs: Not on file  Physical Activity: Not on file  Stress: Not on file  Social Connections: Not on file     Family History:  The patient's family history includes Bladder Cancer in her maternal grandmother; Breast cancer in her paternal aunt; Cervical cancer (age of onset: 31) in her maternal aunt; Diabetes in her father; Heart attack (age of onset: 11) in her father; Hyperlipidemia in her father; Hypertension in her father; Lung cancer in her maternal aunt; Melanoma in her maternal aunt and maternal uncle.  ROS:   12-point review of systems is negative unless otherwise noted in HPI.   EKGs/Labs/Other Studies Reviewed:    Studies reviewed were summarized above. The additional studies were reviewed today:  Limited echo 11/25/2022: 1. Left ventricular ejection fraction, by estimation, is 50 to 55%.  The  left ventricle has low normal function. The left ventricle has no regional  wall motion abnormalities. Left ventricular diastolic parameters are  indeterminate. The average left  ventricular global longitudinal strain is -10.5 %. The global longitudinal  strain is abnormal.   2. Right ventricular systolic function is normal. The right ventricular  size is normal.   3. The mitral valve is normal in structure. No evidence of mitral valve  regurgitation. No evidence of mitral stenosis.   4. The aortic valve was not well visualized. Aortic valve regurgitation  is not visualized. No aortic stenosis is present.   5. The inferior vena cava is normal in size with greater than 50%  respiratory variability, suggesting right atrial pressure of 3 mmHg.  ___________  Limited echo 08/23/2022: 1. Left ventricular ejection fraction, by estimation, is 50 to 55%. The  left ventricle has low normal function. The left ventricle has no regional  wall motion abnormalities. Left ventricular diastolic parameters are  consistent with Grade I diastolic  dysfunction (impaired relaxation). The average left ventricular global  longitudinal strain is -15.4 %.   2. Right ventricular systolic function is normal. The right ventricular  size is normal.   3. The mitral valve is normal in structure. Trivial mitral valve  regurgitation. No evidence of mitral stenosis.   4. The aortic valve was not well visualized. Aortic valve regurgitation  is not visualized. No aortic stenosis is present.   5. The inferior vena cava is normal in size with greater than 50%  respiratory variability, suggesting right atrial pressure of 3 mmHg.  __________  Limited echo 05/17/2022: 1. Left ventricular ejection fraction, by estimation, is 50 to 55%. The  left ventricle has low normal function. The left ventricle has no regional  wall motion abnormalities. The average left ventricular global  longitudinal strain is -11.7 %.   2.  Right ventricular systolic function is normal. The right ventricular  size is normal.   3. The mitral valve is normal in structure. No evidence of mitral valve  regurgitation. No evidence of mitral stenosis.   4. The aortic valve is normal in structure. Aortic valve regurgitation is  not visualized. No aortic stenosis is present.   5. The inferior vena cava is normal in size with greater than 50%  respiratory variability, suggesting right atrial pressure of 3 mmHg.   Comparison(s): 02/25/2022-EF 60%-65%.  __________  2D echo 02/25/2022: 1. Left ventricular ejection fraction, by estimation, is 55 to 60%. The  left ventricle has normal function. The left ventricle has no regional  wall motion abnormalities. Left ventricular diastolic parameters are  consistent with Grade I diastolic  dysfunction (impaired relaxation).   2. Right ventricular systolic function is normal. The right ventricular  size is normal.   3. The mitral valve is normal in structure. No evidence of mitral valve  regurgitation. No evidence of mitral stenosis.   4. The aortic valve is normal in structure. Aortic valve regurgitation is  not visualized. No aortic stenosis is present.   5. The inferior vena cava is normal in size with greater than 50%  respiratory variability, suggesting right atrial pressure of 3 mmHg.   Comparison(s): Previous limited echo reported LVEF of 45-50%. __________   Limited echo 11/11/2021: 1. Left ventricular ejection fraction, by estimation, is 45 to 50%. Left  ventricular ejection fraction by 2D MOD biplane is 46.8 %. The left  ventricle has mildly decreased function.   2. Right ventricular systolic function is normal. The right ventricular  size is normal.   3. The mitral valve is normal in structure. No evidence of mitral valve  regurgitation. __________   2D echo 09/28/2021: 1. Left ventricular ejection fraction, by estimation, is 45%. The left  ventricle has mild to moderately  decreased function. The left ventricle  demonstrates global hypokinesis. Left ventricular diastolic parameters  were normal. The average left  ventricular global longitudinal strain is -15.0 %. The global longitudinal  strain is abnormal.   2. Right ventricular systolic function is normal. The right ventricular  size is normal. Tricuspid regurgitation signal is inadequate for assessing  PA pressure.   3. The mitral valve is normal in structure. Trivial mitral valve  regurgitation. No evidence of mitral stenosis.   4. The aortic valve is tricuspid. Aortic valve regurgitation is not  visualized. No aortic stenosis is present.   Comparison(s): A prior study was performed on 08/31/2021. The left  ventricular function is slightly worse.  __________   2D echo 08/31/2021: 1. Left ventricular ejection fraction, by estimation, is 50 to 55%. The  left ventricle has low normal function. Left ventricular endocardial  border not optimally defined to evaluate regional wall motion. Left  ventricular diastolic parameters were  normal. The average left ventricular global longitudinal strain is -13.4  %. The global longitudinal strain is abnormal.   2. Right ventricular systolic function is normal. The right ventricular  size is normal.   3. The mitral valve is normal in structure. No evidence of mitral valve  regurgitation. No evidence of mitral stenosis.   4. The aortic valve is tricuspid. Aortic valve regurgitation is trivial.  No aortic stenosis is present.  __________   Limited 2D echo 05/05/2021: 1. Left ventricular ejection fraction, by estimation, is 55 to  60%. The  left ventricle has normal function. The left ventricle has no regional  wall motion abnormalities.   2. Right ventricular systolic function is normal. The right ventricular  size is normal.   3. The mitral valve is normal in structure. No evidence of mitral valve  regurgitation.   4. The aortic valve is tricuspid. Aortic valve  regurgitation is not  visualized.  __________   Limited 2D echo 01/29/2021:  1. Left ventricular ejection fraction, by estimation, is 55 to 60%. The  left ventricle has normal function. The left ventricle has no regional  wall motion abnormalities. Left ventricular diastolic function could not  be evaluated.   2. Right ventricular systolic function is normal. The right ventricular  size is normal.   3. The mitral valve is normal in structure. Trivial mitral valve  regurgitation.   4. The aortic valve is normal in structure. Aortic valve regurgitation is  not visualized.  __________   Limited 2D echo 09/14/2020: 1. Left ventricular ejection fraction, by estimation, is 60 to 65%. Left  ventricular ejection fraction by PLAX is 68 %. The left ventricle has  normal function. Left ventricular diastolic parameters are consistent with  Grade I diastolic dysfunction  (impaired relaxation).   2. Right ventricular systolic function is normal. The right ventricular  size is normal.   3. The mitral valve was not well visualized. No evidence of mitral valve  regurgitation.   4. Aortic valve regurgitation is not visualized.  __________   2D echo 01/29/2020: 1. Left ventricular ejection fraction, by estimation, is 60 to 65%. The  left ventricle has normal function. The left ventricle has no regional  wall motion abnormalities. Indeterminate diastolic filling due to E-A  fusion.   2. Right ventricular systolic function is normal. The right ventricular  size is normal.   3. The mitral valve is normal in structure and function. No evidence of  mitral valve regurgitation. No evidence of mitral stenosis.   4. The aortic valve is normal in structure and function. Aortic valve  regurgitation is not visualized. No aortic stenosis is present.  __________   [facility]wyn Reach patch 02/2019: The patient was monitored for 7 days. The predominant rhythm was sinus with an average rate of 88 bpm (range 47 to 181 bpm in  sinus). Rare PAC's and PVC's were noted. Two episodes of wide-complex tachycardia (favor nonsustained ventricular tachycardia over supraventricular tachycardia with aberrancy) occurred, lasting up to 7 beats. Patient triggered events correspond to normal sinus rhythm and narrow complex tachycardia (favor sinus tachycardia).   Predominantly sinus rhythm with two brief episodes of wide-complex tachycardia (favor NSVT over SVT with aberrancy).  Rare PAC's and PVC's also noted.  Patient triggered events correspond to sinus rhythm and narrow complex tachycardia (favor sinus tachycardia but cannot exclude atrial tachycardia). __________   Limited 2D echo 02/19/2019: 1. The left ventricle has normal systolic function, with an ejection  fraction of 55-60%. Left ventricular diastolic parameters were normal.   2. The right ventricle has normal systolc function. The cavity was  normal. There is no increase in right ventricular wall thickness.   3. The aortic valve was not well visualized. __________   Elwyn Reach patch 11/2018: The patient was monitored for 13 days, 8 hours. The predominant rhythm was sinus with an average rate of 82 bpm (range 48 to 175 bpm). Rare PACs and PVCs were noted. A single atrial run lasting 4 beats was noted. There was no sustained arrhythmia or prolonged pause. Patient triggered  events correspond to sinus rhythm and artifact.   Predominantly sinus rhythm with rare PACs and PVCs.  Brief atrial run lasting 4 beats noted. __________   Carlton Adam MPI 10/12/2018: T wave inversion was noted during stress in the III, II, aVF, V3, V2, V4, V5 and V6 leads, and returning to baseline after 1-5 mins of recovery. There was no ST segment deviation noted during stress. Defect 1: There is a medium defect of moderate severity present in the basal anterior and mid anterior location. The left ventricular ejection fraction is normal (55-65%). Very suboptimal study due to breast attenuation artifact  as well as extracardiac uptake at the inferior border of the heart. There is a reversible anterior wall defect suggestive of ischemia. However, I suspect that this is likely artifact given that the defect does not extend to the apex. Correlate clinically. __________   2D echo 08/15/2018: - Left ventricle: The cavity size was mildly dilated. Systolic    function was mildly reduced. The estimated ejection fraction was    45%. Diffuse hypokinesis. Hypokinesis of the anteroseptal    myocardium. Doppler parameters are consistent with abnormal left    ventricular relaxation (grade 1 diastolic dysfunction).  - Aortic valve: Valve area (VTI): 1.88 cm^2. Valve area (Vmax): 1.8    cm^2. Valve area (Vmean): 1.74 cm^2.  - Mitral valve: There was mild regurgitation. Valve area by    continuity equation (using LVOT flow): 2.22 cm^2.  - Left atrium: The atrium was mildly dilated.  - Right ventricle: The cavity size was moderately dilated.  - Right atrium: The atrium was mildly dilated.   Impressions:   - Mild LV systolic dysfunction with wall motion abnormalities    suggestive of CAD.  __________   2D echo 04/26/2018: - Left ventricle: The cavity size was normal. Wall thickness was    normal. Systolic function was normal. The estimated ejection    fraction was in the range of 55% to 60%. Wall motion was normal;    there were no regional wall motion abnormalities. Left    ventricular diastolic function parameters were normal.  - Pulmonary arteries: Systolic pressure could not be accurately    estimated.  __________   2D echo 10/26/2017: - Left ventricle: The cavity size was normal. Systolic function was    normal. The estimated ejection fraction was in the range of 60%    to 65%. Wall motion was normal; there were no regional wall    motion abnormalities. Doppler parameters are consistent with    abnormal left ventricular relaxation (grade 1 diastolic    dysfunction).  - Left atrium: The atrium  was normal in size.  - Right ventricle: Systolic function was normal.  - Pulmonary arteries: Systolic pressure was within the normal    range.    EKG:  EKG is ordered today.  The EKG ordered today demonstrates NSR, 94 bpm, no acute ST-T changes  Recent Labs: 07/15/2022: Magnesium 2.1 11/18/2022: ALT 18; Hemoglobin 10.6; Platelets 294 11/25/2022: BUN 18; Creatinine, Ser 0.84; Potassium 3.6; Sodium 139  Recent Lipid Panel No results found for: "CHOL", "TRIG", "HDL", "CHOLHDL", "VLDL", "LDLCALC", "LDLDIRECT"  PHYSICAL EXAM:    VS:  BP 100/62 (BP Location: Left Arm, Patient Position: Sitting, Cuff Size: Normal)   Pulse 94   Ht _0  (1.702 m)   Wt 174 lb (78.9 kg)   LMP  (LMP Unknown) Comment: Ovaries and tubes removed.April 2019  SpO2 98%   BMI 27.25 kg/m   BMI: Body mass  index is 27.25 kg/m.  Physical Exam Vitals reviewed.  Constitutional:      Appearance: She is well-developed.  HENT:     Head: Normocephalic and atraumatic.  Eyes:     General:        Right eye: No discharge.        Left eye: No discharge.  Neck:     Vascular: No JVD.  Cardiovascular:     Rate and Rhythm: Normal rate and regular rhythm.     Pulses:          Posterior tibial pulses are 2+ on the right side and 2+ on the left side.     Heart sounds: Normal heart sounds, S1 normal and S2 normal. Heart sounds not distant. No midsystolic click and no opening snap. No murmur heard.    No friction rub.  Pulmonary:     Effort: Pulmonary effort is normal. No respiratory distress.     Breath sounds: Normal breath sounds. No decreased breath sounds, wheezing or rales.  Chest:     Chest wall: No tenderness.  Abdominal:     General: There is no distension.  Musculoskeletal:     Cervical back: Normal range of motion.  Skin:    General: Skin is warm and dry.     Nails: There is no clubbing.  Neurological:     Mental Status: She is alert and oriented to person, place, and time.  Psychiatric:        Speech:  Speech normal.        Behavior: Behavior normal.        Thought Content: Thought content normal.        Judgment: Judgment normal.     Wt Readings from Last 3 Encounters:  11/25/22 174 lb (78.9 kg)  11/25/22 173 lb 11.6 oz (78.8 kg)  11/18/22 175 lb 4.3 oz (79.5 kg)     ASSESSMENT & PLAN:   Chemotherapy-induced systolic dysfunction/NICM: She appears euvolemic and well compensated.  Limited echo earlier today showed a stable low normal LV systolic function at 50 to 55%.  She continues to note fatigue and positional dizziness.  Given stability of EF, after discussion with her primary cardiologist, we will undergo a trial of discontinuing Toprol-XL to see if this will allow for improvement in her positional dizziness.  We will follow this up with an echo in 3 months time to evaluate for stability of LV systolic function.  Should her cardiomyopathy return, would consider evaluation with Dr. Haroldine Laws here locally.  WCT/paroxysmal SVT/palpitations: Quiescent.  Monitor off beta-blocker.  Recurrent breast cancer with isolated bone metastasis: Management per oncology.    Disposition: F/u with Dr. Saunders Revel or an APP in 3 months (after echo).   Medication Adjustments/Labs and Tests Ordered: Current medicines are reviewed at length with the patient today.  Concerns regarding medicines are outlined above. Medication changes, Labs and Tests ordered today are summarized above and listed in the Patient Instructions accessible in Encounters.   Signed, Christell Faith, PA-C 11/25/2022 5:12 PM     [facility] [address] [place], [place] [address] [phone]

## 2022-11-18 ENCOUNTER — Inpatient Hospital Stay: Payer: Medicare HMO

## 2022-11-18 ENCOUNTER — Inpatient Hospital Stay: Payer: Medicare HMO | Attending: Oncology

## 2022-11-18 VITALS — BP 103/62 | HR 100 | Temp 96.1°F | Resp 17 | Wt 175.3 lb

## 2022-11-18 DIAGNOSIS — C7951 Secondary malignant neoplasm of bone: Secondary | ICD-10-CM | POA: Insufficient documentation

## 2022-11-18 DIAGNOSIS — C50411 Malignant neoplasm of upper-outer quadrant of right female breast: Secondary | ICD-10-CM | POA: Insufficient documentation

## 2022-11-18 DIAGNOSIS — Z5112 Encounter for antineoplastic immunotherapy: Secondary | ICD-10-CM | POA: Insufficient documentation

## 2022-11-18 DIAGNOSIS — Z79899 Other long term (current) drug therapy: Secondary | ICD-10-CM | POA: Diagnosis not present

## 2022-11-18 DIAGNOSIS — Z170 Estrogen receptor positive status [ER+]: Secondary | ICD-10-CM

## 2022-11-18 LAB — CBC WITH DIFFERENTIAL/PLATELET
Abs Immature Granulocytes: 0.02 10*3/uL (ref 0.00–0.07)
Basophils Absolute: 0 10*3/uL (ref 0.0–0.1)
Basophils Relative: 0 %
Eosinophils Absolute: 0 10*3/uL (ref 0.0–0.5)
Eosinophils Relative: 1 %
HCT: 31.8 % — ABNORMAL LOW (ref 36.0–46.0)
Hemoglobin: 10.6 g/dL — ABNORMAL LOW (ref 12.0–15.0)
Immature Granulocytes: 0 %
Lymphocytes Relative: 15 %
Lymphs Abs: 1.1 10*3/uL (ref 0.7–4.0)
MCH: 30 pg (ref 26.0–34.0)
MCHC: 33.3 g/dL (ref 30.0–36.0)
MCV: 90.1 fL (ref 80.0–100.0)
Monocytes Absolute: 0.3 10*3/uL (ref 0.1–1.0)
Monocytes Relative: 4 %
Neutro Abs: 5.8 10*3/uL (ref 1.7–7.7)
Neutrophils Relative %: 80 %
Platelets: 294 10*3/uL (ref 150–400)
RBC: 3.53 MIL/uL — ABNORMAL LOW (ref 3.87–5.11)
RDW: 13.5 % (ref 11.5–15.5)
WBC: 7.2 10*3/uL (ref 4.0–10.5)
nRBC: 0 % (ref 0.0–0.2)

## 2022-11-18 LAB — COMPREHENSIVE METABOLIC PANEL
ALT: 18 U/L (ref 0–44)
AST: 28 U/L (ref 15–41)
Albumin: 4.1 g/dL (ref 3.5–5.0)
Alkaline Phosphatase: 63 U/L (ref 38–126)
Anion gap: 7 (ref 5–15)
BUN: 18 mg/dL (ref 6–20)
CO2: 22 mmol/L (ref 22–32)
Calcium: 9 mg/dL (ref 8.9–10.3)
Chloride: 109 mmol/L (ref 98–111)
Creatinine, Ser: 0.87 mg/dL (ref 0.44–1.00)
GFR, Estimated: >60 mL/min (ref >60)
Glucose, Bld: 124 mg/dL — ABNORMAL HIGH (ref 70–99)
Potassium: 3.6 mmol/L (ref 3.5–5.1)
Sodium: 138 mmol/L (ref 135–145)
Total Bilirubin: 0.4 mg/dL (ref 0.3–1.2)
Total Protein: 7.2 g/dL (ref 6.5–8.1)

## 2022-11-18 MED ORDER — SODIUM CHLORIDE 0.9% FLUSH
10.0000 mL | Freq: Once | INTRAVENOUS | Status: DC
  Filled 2022-11-18: qty 10

## 2022-11-18 MED ORDER — ACETAMINOPHEN 325 MG PO TABS: 650.0000 mg | ORAL_TABLET | Freq: Once | ORAL | Status: DC

## 2022-11-18 MED ORDER — SODIUM CHLORIDE 0.9 % IV SOLN
Freq: Once | INTRAVENOUS | Status: AC
  Filled 2022-11-18: qty 250

## 2022-11-18 MED ORDER — DIPHENHYDRAMINE HCL 25 MG PO CAPS: 50.0000 mg | ORAL_CAPSULE | Freq: Once | ORAL | Status: DC

## 2022-11-18 MED ORDER — HEPARIN SOD (PORK) LOCK FLUSH 100 UNIT/ML IV SOLN
500.0000 [IU] | Freq: Once | INTRAVENOUS | Status: AC
  Filled 2022-11-18: qty 5

## 2022-11-18 MED ORDER — HEPARIN SOD (PORK) LOCK FLUSH 100 UNIT/ML IV SOLN
INTRAVENOUS | Status: AC
  Administered 2022-11-18: 500 [IU] via INTRAVENOUS
  Filled 2022-11-18: qty 5

## 2022-11-18 MED ORDER — SODIUM CHLORIDE 0.9 % IV SOLN
420.0000 mg | Freq: Once | INTRAVENOUS | Status: AC
  Administered 2022-11-18: 420 mg via INTRAVENOUS
  Filled 2022-11-18: qty 14

## 2022-11-18 MED ORDER — TRASTUZUMAB-ANNS CHEMO 150 MG IV SOLR
6.0000 mg/kg | Freq: Once | INTRAVENOUS | Status: AC
  Administered 2022-11-18: 462 mg via INTRAVENOUS
  Filled 2022-11-18: qty 22

## 2022-11-18 NOTE — Patient Instructions (Signed)
[facility] CANCER CTR AT [place]  Discharge Instructions: Thank you for choosing [facility] to provide your oncology and hematology care.  If you have a lab appointment with the [facility], please go directly to the [facility] and check in at the registration area.  Wear comfortable clothing and clothing appropriate for easy access to any Portacath or PICC line.   We strive to give you quality time with your provider. You may need to reschedule your appointment if you arrive late (15 or more minutes).  Arriving late affects you and other patients whose appointments are after yours.  Also, if you miss three or more appointments without notifying the office, you may be dismissed from the clinic at the provider's discretion.      For prescription refill requests, have your pharmacy contact our office and allow 72 hours for refills to be completed.    Today you received the following chemotherapy and/or immunotherapy agents Kanjinti and Perjeta        To help prevent nausea and vomiting after your treatment, we encourage you to take your nausea medication as directed.  BELOW ARE SYMPTOMS THAT SHOULD BE REPORTED IMMEDIATELY: *FEVER GREATER THAN 100.4 F (38 C) OR HIGHER *CHILLS OR SWEATING *NAUSEA AND VOMITING THAT IS NOT CONTROLLED WITH YOUR NAUSEA MEDICATION *UNUSUAL SHORTNESS OF BREATH *UNUSUAL BRUISING OR BLEEDING *URINARY PROBLEMS (pain or burning when urinating, or frequent urination) *BOWEL PROBLEMS (unusual diarrhea, constipation, pain near the anus) TENDERNESS IN MOUTH AND THROAT WITH OR WITHOUT PRESENCE OF ULCERS (sore throat, sores in mouth, or a toothache) UNUSUAL RASH, SWELLING OR PAIN  UNUSUAL VAGINAL DISCHARGE OR ITCHING   Items with * indicate a potential emergency and should be followed up as soon as possible or go to the Emergency Department if any problems should occur.  Please show the CHEMOTHERAPY ALERT CARD or IMMUNOTHERAPY ALERT CARD at  check-in to the Emergency Department and triage nurse.  Should you have questions after your visit or need to cancel or reschedule your appointment, please contact [facility] CANCER [place] AT [place]  [phone] and follow the prompts.  Office hours are 8:00 a.m. to 4:30 p.m. Monday - Friday. Please note that voicemails left after 4:00 p.m. may not be returned until the following business day.  We are closed weekends and major holidays. You have access to a nurse at all times for urgent questions. Please call the main number to the clinic [phone] and follow the prompts.  For any non-urgent questions, you may also contact your provider using MyChart. We now offer e-Visits for anyone 39 and older to request care online for non-urgent symptoms. For details visit [email or web address].   Also download the MyChart app! Go to the app store, search "MyChart", open the app, select [facility], and log in with your MyChart username and password.  Masks are optional in the cancer centers. If you would like for your care team to wear a mask while they are taking care of you, please let them know. For doctor visits, patients may have with them one support person who is at least 39 years old. At this time, visitors are not allowed in the infusion area.

## 2022-11-25 ENCOUNTER — Inpatient Hospital Stay: Payer: Medicare HMO

## 2022-11-25 ENCOUNTER — Ambulatory Visit (INDEPENDENT_AMBULATORY_CARE_PROVIDER_SITE_OTHER): Payer: Medicare HMO | Admitting: Physician Assistant

## 2022-11-25 ENCOUNTER — Encounter: Payer: Self-pay | Admitting: Physician Assistant

## 2022-11-25 ENCOUNTER — Ambulatory Visit: Payer: Medicare HMO | Attending: Internal Medicine

## 2022-11-25 VITALS — BP 100/62 | HR 94 | Ht 67.0 in | Wt 174.0 lb

## 2022-11-25 VITALS — Wt 173.7 lb

## 2022-11-25 DIAGNOSIS — Z5112 Encounter for antineoplastic immunotherapy: Secondary | ICD-10-CM | POA: Diagnosis not present

## 2022-11-25 DIAGNOSIS — T451X5D Adverse effect of antineoplastic and immunosuppressive drugs, subsequent encounter: Secondary | ICD-10-CM

## 2022-11-25 DIAGNOSIS — I428 Other cardiomyopathies: Secondary | ICD-10-CM

## 2022-11-25 DIAGNOSIS — Z170 Estrogen receptor positive status [ER+]: Secondary | ICD-10-CM

## 2022-11-25 DIAGNOSIS — I427 Cardiomyopathy due to drug and external agent: Secondary | ICD-10-CM | POA: Diagnosis not present

## 2022-11-25 DIAGNOSIS — C50411 Malignant neoplasm of upper-outer quadrant of right female breast: Secondary | ICD-10-CM

## 2022-11-25 DIAGNOSIS — R000 Tachycardia, unspecified: Secondary | ICD-10-CM | POA: Diagnosis not present

## 2022-11-25 DIAGNOSIS — I471 Supraventricular tachycardia, unspecified: Secondary | ICD-10-CM | POA: Diagnosis not present

## 2022-11-25 LAB — BASIC METABOLIC PANEL
Anion gap: 9 (ref 5–15)
BUN: 18 mg/dL (ref 6–20)
CO2: 22 mmol/L (ref 22–32)
Calcium: 9 mg/dL (ref 8.9–10.3)
Chloride: 108 mmol/L (ref 98–111)
Creatinine, Ser: 0.84 mg/dL (ref 0.44–1.00)
GFR, Estimated: >60 mL/min (ref >60)
Glucose, Bld: 120 mg/dL — ABNORMAL HIGH (ref 70–99)
Potassium: 3.6 mmol/L (ref 3.5–5.1)
Sodium: 139 mmol/L (ref 135–145)

## 2022-11-25 LAB — ECHOCARDIOGRAM LIMITED
Area-P 1/2: 4.74 cm2
S' Lateral: 3.45 cm

## 2022-11-25 MED ORDER — FULVESTRANT 250 MG/5ML IM SOSY
500.0000 mg | PREFILLED_SYRINGE | INTRAMUSCULAR | Status: DC
  Administered 2022-11-25: 500 mg via INTRAMUSCULAR
  Filled 2022-11-25: qty 10

## 2022-11-25 MED ORDER — SODIUM CHLORIDE 0.9 % IV SOLN
Freq: Once | INTRAVENOUS | Status: AC
  Filled 2022-11-25: qty 250

## 2022-11-25 MED ORDER — HEPARIN SOD (PORK) LOCK FLUSH 100 UNIT/ML IV SOLN
INTRAVENOUS | Status: AC
  Filled 2022-11-25: qty 5

## 2022-11-25 MED ORDER — PERFLUTREN LIPID MICROSPHERE
1.0000 mL | INTRAVENOUS | Status: AC | PRN
  Administered 2022-11-25: 2 mL via INTRAVENOUS

## 2022-11-25 MED ORDER — SODIUM CHLORIDE 0.9% FLUSH
10.0000 mL | Freq: Once | INTRAVENOUS | Status: AC
  Administered 2022-11-25: 10 mL via INTRAVENOUS
  Filled 2022-11-25: qty 10

## 2022-11-25 MED ORDER — ZOLEDRONIC ACID 4 MG/100ML IV SOLN
4.0000 mg | Freq: Once | INTRAVENOUS | Status: AC
  Administered 2022-11-25: 4 mg via INTRAVENOUS
  Filled 2022-11-25: qty 100

## 2022-11-25 NOTE — Patient Instructions (Signed)
Medication Instructions:  Your physician has recommended you make the following change in your medication:   HOLD Metoprolol  *If you need a refill on your cardiac medications before your next appointment, please call your pharmacy*   Lab Work: None  If you have labs (blood work) drawn today and your tests are completely normal, you will receive your results only by: [facility] (if you have MyChart) OR A paper copy in the mail If you have any lab test that is abnormal or we need to change your treatment, we will call you to review the results.   Testing/Procedures: Your physician has requested that you have an echocardiogram. Echocardiography is a painless test that uses sound waves to create images of your heart. It provides your doctor with information about the size and shape of your heart and how well your heart's chambers and valves are working. This procedure takes approximately one hour. There are no restrictions for this procedure. Please do NOT wear cologne, perfume, aftershave, or lotions (deodorant is allowed). Please arrive 15 minutes prior to your appointment time.    Follow-Up: At [facility], you and your health needs are our priority.  As part of our continuing mission to provide you with exceptional heart care, we have created designated Provider Care Teams.  These Care Teams include your primary Cardiologist (physician) and Advanced Practice Providers (APPs -  Physician Assistants and Nurse Practitioners) who all work together to provide you with the care you need, when you need it.   Your next appointment:   Follow up after echocardiogram  The format for your next appointment:   In Person  Provider:   Nelva Bush, MD or Christell Faith, PA-C        Important Information About [facility]

## 2022-12-09 ENCOUNTER — Inpatient Hospital Stay (HOSPITAL_BASED_OUTPATIENT_CLINIC_OR_DEPARTMENT_OTHER): Payer: Medicare HMO | Admitting: Oncology

## 2022-12-09 ENCOUNTER — Inpatient Hospital Stay: Payer: Medicare HMO

## 2022-12-09 ENCOUNTER — Telehealth: Payer: Self-pay

## 2022-12-09 ENCOUNTER — Encounter: Payer: Self-pay | Admitting: Oncology

## 2022-12-09 VITALS — BP 114/77 | HR 111 | Temp 97.2°F | Wt 174.2 lb

## 2022-12-09 DIAGNOSIS — Z170 Estrogen receptor positive status [ER+]: Secondary | ICD-10-CM

## 2022-12-09 DIAGNOSIS — M0520 Rheumatoid vasculitis with rheumatoid arthritis of unspecified site: Secondary | ICD-10-CM

## 2022-12-09 DIAGNOSIS — C50811 Malignant neoplasm of overlapping sites of right female breast: Secondary | ICD-10-CM

## 2022-12-09 DIAGNOSIS — C7951 Secondary malignant neoplasm of bone: Secondary | ICD-10-CM

## 2022-12-09 DIAGNOSIS — I428 Other cardiomyopathies: Secondary | ICD-10-CM

## 2022-12-09 DIAGNOSIS — C50411 Malignant neoplasm of upper-outer quadrant of right female breast: Secondary | ICD-10-CM | POA: Diagnosis not present

## 2022-12-09 DIAGNOSIS — Z5112 Encounter for antineoplastic immunotherapy: Secondary | ICD-10-CM

## 2022-12-09 DIAGNOSIS — G620 Drug-induced polyneuropathy: Secondary | ICD-10-CM

## 2022-12-09 DIAGNOSIS — T451X5A Adverse effect of antineoplastic and immunosuppressive drugs, initial encounter: Secondary | ICD-10-CM

## 2022-12-09 DIAGNOSIS — E041 Nontoxic single thyroid nodule: Secondary | ICD-10-CM

## 2022-12-09 LAB — CBC WITH DIFFERENTIAL/PLATELET
Abs Immature Granulocytes: 0.01 10*3/uL (ref 0.00–0.07)
Basophils Absolute: 0 10*3/uL (ref 0.0–0.1)
Basophils Relative: 1 %
Eosinophils Absolute: 0.1 10*3/uL (ref 0.0–0.5)
Eosinophils Relative: 2 %
HCT: 34.1 % — ABNORMAL LOW (ref 36.0–46.0)
Hemoglobin: 11.1 g/dL — ABNORMAL LOW (ref 12.0–15.0)
Immature Granulocytes: 0 %
Lymphocytes Relative: 30 %
Lymphs Abs: 1.6 10*3/uL (ref 0.7–4.0)
MCH: 29.8 pg (ref 26.0–34.0)
MCHC: 32.6 g/dL (ref 30.0–36.0)
MCV: 91.4 fL (ref 80.0–100.0)
Monocytes Absolute: 0.7 10*3/uL (ref 0.1–1.0)
Monocytes Relative: 14 %
Neutro Abs: 2.8 10*3/uL (ref 1.7–7.7)
Neutrophils Relative %: 53 %
Platelets: 243 10*3/uL (ref 150–400)
RBC: 3.73 MIL/uL — ABNORMAL LOW (ref 3.87–5.11)
RDW: 13.8 % (ref 11.5–15.5)
WBC: 5.3 10*3/uL (ref 4.0–10.5)
nRBC: 0 % (ref 0.0–0.2)

## 2022-12-09 LAB — COMPREHENSIVE METABOLIC PANEL
ALT: 19 U/L (ref 0–44)
AST: 29 U/L (ref 15–41)
Albumin: 4 g/dL (ref 3.5–5.0)
Alkaline Phosphatase: 64 U/L (ref 38–126)
Anion gap: 7 (ref 5–15)
BUN: 17 mg/dL (ref 6–20)
CO2: 22 mmol/L (ref 22–32)
Calcium: 8.5 mg/dL — ABNORMAL LOW (ref 8.9–10.3)
Chloride: 110 mmol/L (ref 98–111)
Creatinine, Ser: 0.81 mg/dL (ref 0.44–1.00)
GFR, Estimated: >60 mL/min (ref >60)
Glucose, Bld: 125 mg/dL — ABNORMAL HIGH (ref 70–99)
Potassium: 3.3 mmol/L — ABNORMAL LOW (ref 3.5–5.1)
Sodium: 139 mmol/L (ref 135–145)
Total Bilirubin: 0.3 mg/dL (ref 0.3–1.2)
Total Protein: 7.4 g/dL (ref 6.5–8.1)

## 2022-12-09 LAB — C-REACTIVE PROTEIN: CRP: 2.4 mg/dL — ABNORMAL HIGH (ref <1.0)

## 2022-12-09 LAB — SEDIMENTATION RATE: Sed Rate: 46 mm/hr — ABNORMAL HIGH (ref 0–20)

## 2022-12-09 MED ORDER — HEPARIN SOD (PORK) LOCK FLUSH 100 UNIT/ML IV SOLN
500.0000 [IU] | Freq: Once | INTRAVENOUS | Status: AC | PRN
  Administered 2022-12-09: 500 [IU]
  Filled 2022-12-09: qty 5

## 2022-12-09 MED ORDER — SODIUM CHLORIDE 0.9 % IV SOLN
2.0000 g | Freq: Once | INTRAVENOUS | Status: AC
  Administered 2022-12-09: 2 g via INTRAVENOUS
  Filled 2022-12-09: qty 20

## 2022-12-09 MED ORDER — ACETAMINOPHEN 325 MG PO TABS: 650.0000 mg | ORAL_TABLET | Freq: Once | ORAL | Status: DC

## 2022-12-09 MED ORDER — SODIUM CHLORIDE 0.9 % IV SOLN
Freq: Once | INTRAVENOUS | Status: AC
  Filled 2022-12-09: qty 250

## 2022-12-09 MED ORDER — SODIUM CHLORIDE 0.9% FLUSH
10.0000 mL | INTRAVENOUS | Status: DC | PRN
  Administered 2022-12-09: 10 mL
  Filled 2022-12-09: qty 10

## 2022-12-09 MED ORDER — DIPHENHYDRAMINE HCL 25 MG PO CAPS: 50.0000 mg | ORAL_CAPSULE | Freq: Once | ORAL | Status: DC

## 2022-12-09 MED ORDER — TRASTUZUMAB-ANNS CHEMO 150 MG IV SOLR
6.0000 mg/kg | Freq: Once | INTRAVENOUS | Status: AC
  Administered 2022-12-09: 462 mg via INTRAVENOUS
  Filled 2022-12-09: qty 22

## 2022-12-09 MED ORDER — SODIUM CHLORIDE 0.9 % IV SOLN
420.0000 mg | Freq: Once | INTRAVENOUS | Status: AC
  Administered 2022-12-09: 420 mg via INTRAVENOUS
  Filled 2022-12-09: qty 14

## 2022-12-09 NOTE — Assessment & Plan Note (Signed)
Recurrent breast cancer with isolated bone metastasis, stage IV #Initially stage IIIA right breast cancer, ER positive, HER-2 negative status post bilateral oophorectomy,Right mastectomy and right axillary lymph node dissection, status post implant, and implant removal.  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 ER positive, HER-2 positive  .- s/p Palliative radiation to T7  Labs reviewed and discussed Proceed with Kanjinti Perjecta.   Continue fulvestrant monthly.  restaging PET- stable

## 2022-12-09 NOTE — Assessment & Plan Note (Signed)
continue Lyrica and nortriptyline symptoms stable 

## 2022-12-09 NOTE — Assessment & Plan Note (Signed)
Antibody treatment plan as list above. 

## 2022-12-09 NOTE — Assessment & Plan Note (Signed)
Follow up with  rheumatology.  Patient is on methotrexate,  plaquenil.  

## 2022-12-09 NOTE — Patient Instructions (Signed)
[facility] CANCER CTR AT [place]  Discharge Instructions: Thank you for choosing [facility] to provide your oncology and hematology care.  If you have a lab appointment with the [facility], please go directly to the [facility] and check in at the registration area.  Wear comfortable clothing and clothing appropriate for easy access to any Portacath or PICC line.   We strive to give you quality time with your provider. You may need to reschedule your appointment if you arrive late (15 or more minutes).  Arriving late affects you and other patients whose appointments are after yours.  Also, if you miss three or more appointments without notifying the office, you may be dismissed from the clinic at the provider's discretion.      For prescription refill requests, have your pharmacy contact our office and allow 72 hours for refills to be completed.    Today you received the following chemotherapy and/or immunotherapy agents Kanjinti and Perjeta        To help prevent nausea and vomiting after your treatment, we encourage you to take your nausea medication as directed.  BELOW ARE SYMPTOMS THAT SHOULD BE REPORTED IMMEDIATELY: *FEVER GREATER THAN 100.4 F (38 C) OR HIGHER *CHILLS OR SWEATING *NAUSEA AND VOMITING THAT IS NOT CONTROLLED WITH YOUR NAUSEA MEDICATION *UNUSUAL SHORTNESS OF BREATH *UNUSUAL BRUISING OR BLEEDING *URINARY PROBLEMS (pain or burning when urinating, or frequent urination) *BOWEL PROBLEMS (unusual diarrhea, constipation, pain near the anus) TENDERNESS IN MOUTH AND THROAT WITH OR WITHOUT PRESENCE OF ULCERS (sore throat, sores in mouth, or a toothache) UNUSUAL RASH, SWELLING OR PAIN  UNUSUAL VAGINAL DISCHARGE OR ITCHING   Items with * indicate a potential emergency and should be followed up as soon as possible or go to the Emergency Department if any problems should occur.  Please show the CHEMOTHERAPY ALERT CARD or IMMUNOTHERAPY ALERT CARD at  check-in to the Emergency Department and triage nurse.  Should you have questions after your visit or need to cancel or reschedule your appointment, please contact [facility] CANCER [place] AT [place]  [phone] and follow the prompts.  Office hours are 8:00 a.m. to 4:30 p.m. Monday - Friday. Please note that voicemails left after 4:00 p.m. may not be returned until the following business day.  We are closed weekends and major holidays. You have access to a nurse at all times for urgent questions. Please call the main number to the clinic [phone] and follow the prompts.  For any non-urgent questions, you may also contact your provider using MyChart. We now offer e-Visits for anyone 57 and older to request care online for non-urgent symptoms. For details visit [email or web address].   Also download the MyChart app! Go to the app store, search "MyChart", open the app, select [facility], and log in with your MyChart username and password.  Masks are optional in the cancer centers. If you would like for your care team to wear a mask while they are taking care of you, please let them know. For doctor visits, patients may have with them one support person who is at least 39 years old. At this time, visitors are not allowed in the infusion area.

## 2022-12-09 NOTE — Telephone Encounter (Signed)
-----   Message from Theresa Server, MD sent at 12/09/2022  2:25 PM EST ----- She is due for repeat US thyroid for thyroid nodule. I called her and she agrees. Please arrange. Non urgent.

## 2022-12-09 NOTE — Assessment & Plan Note (Signed)
Chronic tachycardia Stable low-normal LVEF 50-55%. Follow up with cardiology. She gets ECHO Q3 months. 

## 2022-12-09 NOTE — Assessment & Plan Note (Signed)
status post radiation. Continue Zometa monthly, hold if Calcium is <8.6 Continue calcium supplementation. Continue oxycodone as needed.   

## 2022-12-09 NOTE — Progress Notes (Signed)
Hematology/Oncology Progress note [phone] [phone]    Name of the patient: Theresa Chaney  [id number]  02/06/1981   Date of visit: 12/09/22   ASSESSMENT & PLAN:   Cancer Staging  Breast cancer of upper-outer quadrant of right female breast [facility]) Staging form: Breast, AJCC 8th Edition - Clinical: G2, ER+, PR+, HER2- - Signed by Earlie Server, MD on 04/05/2018 - Pathologic stage from 04/04/2018: No Stage Recommended (ypT3, pN2, cM0, G3, ER+, PR-, HER2-) - Signed by Earlie Server, MD on 04/04/2018 - Pathologic: Stage IV (rpTX, pNX, cM1, ER+, PR-, HER2+) - Signed by Earlie Server, MD on 12/10/2020   Breast cancer of upper-outer quadrant of right female breast ([place]) Recurrent breast cancer with isolated bone metastasis, stage IV #Initially stage IIIA right breast cancer, ER positive, HER-2 negative status post bilateral oophorectomy,Right mastectomy and right axillary lymph node dissection, status post implant, and implant removal.  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 ER positive, HER-2 positive  .- s/p Palliative radiation to T7  Labs reviewed and discussed Proceed with Kanjinti Perjecta.   Continue fulvestrant monthly.  restaging PET- stable  Encounter for monoclonal antibody treatment for malignancy Antibody treatment plan as list above.  Hypocalcemia Continue calcium supplementation 62m TID.  IV calcium gluconate 2 g x 1 today.   Malignant neoplasm metastatic to bone ([facility] status post radiation. Continue Zometa monthly, hold if Calcium is <8.6 Continue calcium supplementation. Continue oxycodone as needed.    Neuropathy due to chemotherapeutic drug (HCC) continue Lyrica and nortriptyline symptoms stable  Nonischemic cardiomyopathy (HCC)  Chronic tachycardia Stable low-normal LVEF 50-55%. Follow up with cardiology. She gets ECHO Q3 months.  Rheumatoid arteritis  ([place] Follow up with  rheumatology.  Patient is on methotrexate,  plaquenil.   Thyroid nodule Left lobe nodules are small no need for follow-up.   Right lobe nodule 1.1 cm, repeat thyroid ultrasound    Orders Placed This Encounter  Procedures   Comprehensive metabolic panel    Standing Status:   Future    Standing Expiration Date:   01/20/2024   CBC with Differential    Standing Status:   Future    Standing Expiration Date:   01/20/2024   Follow up  lab [place] weeks.  lab MD Kanjinti Perjecta 6 weeks.  fulvestrant/Zometa Q 4 weeks   All questions were answered. The patient knows to call the clinic with any problems, questions or concerns.  ZEarlie Server MD, PhD [facility] H[facility] Oncology 12/09/2022      REASON FOR VISIT Follow up for  treatment of breast cancer  Oncology History  Oncology History Overview Note        Malignant neoplasm of overlapping sites of right breast in female, estrogen receptor positive ([place]  Breast cancer of upper-outer quadrant of right female breast ([facility]  10/19/2017 Initial Diagnosis   Malignant neoplasm of overlapping sites of right breast in female, estrogen receptor positive ([place]   11/08/2017 - 01/03/2018 Chemotherapy   Neoadjuvant ddAC x 4 + 1 cycle of Taxol  due to lack of response, surgery was offered.   03/19/2018 Surgery   S/p right mastectomy and right axillary dissection, immediate breast reconstruction with placement of expanders.  ypT3 ypN2, + lymphovascular invasion,  Grade 3, margin is negative, close. ER 90%, PR 0%, HER2 IHC negative.   # elective bilateral salpingo-oophorectomy..    06/11/2018 Imaging   s/p 11 cycles Taxol adjuvantly   10/10/2018 -  Radiation Therapy   adjuvant right chest wall radiation   06/03/2019 Surgery   underwent elective left prophylactic mastectomy and sentinel lymph node biopsy of left axilla Pathology negative for malignancy  # Mediport removal  # right implant removal on     07/07/2020 Imaging   MRI thoracic spine without contrast showed lesions involving the T7 posterior elements most concerning for metastatic lesion.  No evidence of epidural tumor.  Minimal thoracic spondylosis without stenosis. MRI was reviewed by me and a PET scan was obtained for further evaluation   07/20/2020 Imaging   PET scan showed hypermetabolic metastasis involving the posterior element of T7, no additional evidence of metastasis in the neck, chest, abdomen or pelvis.   07/29/2020 Procedure   T7 lesion biopsy showed metastatic carcinoma, compatible with breast origin.  Receptor status staining showed ER 71-80% positive, PR negative, HER-2 positive IHC 3+   08/31/2020 -  Radiation Therapy    finished spine radiation    09/17/2020 -  Chemotherapy   Patient is on Treatment Plan :  BREAST Trastuzumab + Pertuzumab q21d     12/10/2020 Cancer Staging   Staging form: Breast, AJCC 8th Edition - Pathologic: Stage IV (rpTX, pNX, cM1, ER+, PR-, HER2+) - Signed by Earlie Server, MD on 12/10/2020   03/15/2021 Imaging    PET scan showed no focal hypermetabolic activity to suggest skeletal metastasis.  Mild hypermetabolic activity along the right T7-8 paraspinal musculature.  Max SUV 3.3.  Likely postprocedural-   09/28/2021 Echocardiogram   further decrease of LVEF to 45%   10/14/2021 Imaging   PET  Post bilateral mastectomy and RIGHT axillary dissection without signs of recurrent or metastatic disease.   11/12/2021 Echocardiogram   LVEF of 45-50%.   02/18/2022 Imaging   MRI thoracic and lumbar spine w wo contrast  1. Negative MRIs of the thoracic and lumbar spine. No evidence for locally recurrent metastasis at the level of T7. No other new metastatic disease elsewhere with thoracolumbar spine. 2. No significant disc pathology, stenosis, or evidence for neural impingement.    02/25/2022 Echocardiogram   LVEF 55-60%.   03/16/2022 Imaging   PET showed Stable PET-CT. No findings for local  recurrent breast cancer,locoregional adenopathy or distant metastatic disease.    07/20/2022 Imaging   MRI Thoracic Spine w wo contrast 1. Interval resolution of the increased T2 signal contrast enhancement in the posterior elements of T7. No new lesion identified. 2. No spinal canal or neural foraminal stenosis.   08/23/2022 Echocardiogram   Stable low-normal LVEF at 50-55%.   10/05/2022 Imaging   PET restaging Stable examination without evidence of hypermetabolic recurrence or metastatic disease.   Persistent minimally metabolic stranding in the posterior bilateral gluteal soft tissues may reflect sequela of subcutaneous injections      INTERVAL HISTORY 41 yo female with above oncology history reviewed by me presents for follow-up of management of metastatic breast cancer.  Chronic back pain, unchanged.   Intermittent diarrhea, manageable.  + left side of chest wall cramp.      Review of Systems  Constitutional:  Negative for chills, fever, malaise/fatigue and weight loss.  HENT:  Negative for sore throat.   Eyes:  Negative for redness.  Respiratory:  Negative for cough, shortness of breath and wheezing.   Cardiovascular:  Negative for chest pain, palpitations and leg swelling.  Gastrointestinal:  Negative for abdominal pain, blood in stool, heartburn, nausea and vomiting.  Genitourinary:  Negative for dysuria.  Musculoskeletal:  Positive for back pain  and joint pain. Negative for myalgias.  Skin:  Negative for rash.  Neurological:  Positive for tingling. Negative for dizziness and tremors.  Endo/Heme/Allergies:  Does not bruise/bleed easily.  Psychiatric/Behavioral:  Negative for hallucinations. The patient does not have insomnia.     No Known Allergies  Patient Active Problem List   Diagnosis Date Noted   Breast cancer of upper-outer quadrant of right female breast ([place]) 03/19/2018    Priority: High   Thyroid nodule 06/04/2022    Priority: Medium    Hypokalemia  06/03/2022    Priority: Medium    Malignant neoplasm metastatic to bone ([place]) 03/25/2021    Priority: Medium    Encounter for monoclonal antibody treatment for malignancy 12/10/2020    Priority: Medium    Hypocalcemia 11/19/2020    Priority: Medium    Neuropathy due to chemotherapeutic drug ([place]) 08/11/2020    Priority: Medium    Rheumatoid arteritis ([place]) 10/13/2019    Priority: Medium    Nonischemic cardiomyopathy ([place]) 08/23/2018    Priority: Medium    Encounter for antineoplastic chemotherapy 10/29/2020    Priority: Low   Goals of care, counseling/discussion 08/11/2020    Priority: Low   Tachycardia, unspecified 04/15/2021   Bone lesion 11/19/2020   Inflammatory arthritis 11/19/2020   HER2-positive carcinoma of breast ([place]) 08/11/2020   Bilateral hand swelling 10/03/2019   Fracture of neck of metacarpal bone 05/14/2019   Chronic fatigue 04/09/2019   Polyarthralgia 04/09/2019   Status post right breast reconstruction 02/26/2019   Status post right mastectomy 02/26/2019   Mastalgia 02/15/2019   Shortness of breath 08/23/2018   Preprocedural cardiovascular examination 08/23/2018   Tachycardia 08/23/2018   Palpitations 08/23/2018   Estrogen receptor positive status (ER+) 04/04/2018   Acquired absence of right breast and nipple 04/03/2018   Family history of cancer    Malignant neoplasm of overlapping sites of right breast in female, estrogen receptor positive ([place]) 10/19/2017   Gastroesophageal reflux disease without esophagitis 02/24/2017   Generalized anxiety disorder 10/03/2014   Headache 10/03/2014     Past Medical History:  Diagnosis Date   Anemia    Arthritis    BRCA negative 11/26/2017   Breast cancer ([place]) 10/11/2017   Multifocal, ER positive, PR negative, HER-2 negative. ypT3 ypN2a 8.7 cm, 4/15 nodes   Cardiomyopathy ([place])    a. 10/2017 Echo: EF 60-65%, no rwma, Gr1 DD, nl RV size/fxn; b. 04/2018 Echo: EF 55-60%, no rwma, Nl RV size/fxn; c. 08/2018 Echo: EF  45%, diff HK, ? HK of antsept wall. Gr1 DD. Mild MR. Mild LAE/RAE. Mod dil RV.    Chronic bronchitis ([place]) 11/2017   COPD (chronic obstructive pulmonary disease) (HCC)    MILD PER CXR   Depression    Family history of cancer    GERD (gastroesophageal reflux disease)    Headache    MIGRAINES   Heart murmur    ASYMPTOMATIC   Personal history of chemotherapy    current for right breast ca     Past Surgical History:  Procedure Laterality Date   AXILLARY LYMPH NODE DISSECTION Right 03/19/2018   Procedure: AXILLARY LYMPH NODE DISSECTION;  Surgeon: Robert Bellow, MD;  Location: [facility];  Service: General;  Laterality: Right;   BREAST BIOPSY Right 10/11/2017   12:30 posterior coil clip invasive mammary carcinoma   BREAST BIOPSY Right 10/11/2017   11:30 middle depth ribbon clip DCIS   BREAST BIOPSY Right 10/11/2017   5:30 anterior depth x shape invasive ductal carcinoma  BREAST IMPLANT REMOVAL Right 06/03/2019   Procedure: REMOVAL OF RIGHT BREAST IMPLANTS;  Surgeon: Wallace Going, DO;  Location: [facility];  Service: Plastics;  Laterality: Right;   BREAST RECONSTRUCTION WITH PLACEMENT OF TISSUE EXPANDER AND FLEX HD (ACELLULAR HYDRATED DERMIS) Right 03/19/2018   Procedure: BREAST RECONSTRUCTION WITH PLACEMENT OF TISSUE EXPANDER AND FLEX HD (ACELLULAR HYDRATED DERMIS);  Surgeon: Wallace Going, DO;  Location: [facility];  Service: Plastics;  Laterality: Right;   CARPAL TUNNEL RELEASE Bilateral 2020   CHOLECYSTECTOMY N/A 04/27/2020   Procedure: LAPAROSCOPIC CHOLECYSTECTOMY WITH INTRAOPERATIVE CHOLANGIOGRAM;  Surgeon: Robert Bellow, MD;  Location: [facility];  Service: General;  Laterality: N/A;   ESOPHAGOGASTRODUODENOSCOPY (EGD) WITH PROPOFOL N/A 04/17/2020   Procedure: ESOPHAGOGASTRODUODENOSCOPY (EGD) WITH PROPOFOL;  Surgeon: Robert Bellow, MD;  Location: [facility];  Service: Endoscopy;  Laterality: N/A;  with biopsy   LAPAROSCOPIC BILATERAL SALPINGO OOPHERECTOMY  Bilateral 03/19/2018   Procedure: LAPAROSCOPIC BILATERAL SALPINGO OOPHORECTOMY;  Surgeon: Benjaman Kindler, MD;  Location: [facility];  Service: Gynecology;  Laterality: Bilateral;   MASTECTOMY Right 03/2018   MASTECTOMY W/ SENTINEL NODE BIOPSY Right 03/19/2018   Procedure: MASTECTOMY WITH SENTINEL LYMPH NODE BIOPSY;  Surgeon: Robert Bellow, MD;  Location: [facility];  Service: General;  Laterality: Right;   PORT-A-CATH REMOVAL Left 06/03/2019   Procedure: REMOVAL PORT-A-CATH;  Surgeon: Robert Bellow, MD;  Location: [facility];  Service: General;  Laterality: Left;   PORTACATH PLACEMENT Left 10/24/2017   Procedure: INSERTION PORT-A-CATH;  Surgeon: Robert Bellow, MD;  Location: [facility];  Service: General;  Laterality: Left;   PORTACATH PLACEMENT Right 09/28/2020   Procedure: INSERTION PORT-A-CATH;  Surgeon: Robert Bellow, MD;  Location: [facility];  Service: General;  Laterality: Right;   REMOVAL OF TISSUE EXPANDER AND PLACEMENT OF IMPLANT Right 07/20/2018   Procedure: REMOVAL OF RIGHT BREAST TISSUE EXPANDER AND PLACEMENT OF IMPLANT;  Surgeon: Wallace Going, DO;  Location: [facility];  Service: Plastics;  Laterality: Right;   SIMPLE MASTECTOMY WITH AXILLARY SENTINEL NODE BIOPSY Left 06/03/2019   Procedure: SIMPLE MASTECTOMY LEFT;  Surgeon: Robert Bellow, MD;  Location: [facility];  Service: General;  Laterality: Left;    Social History   Socioeconomic History   Marital status: Married    Spouse name: Not on file   Number of children: Not on file   Years of education: Not on file   Highest education level: Not on file  Occupational History   Occupation: pharmacy tech    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 [employer]   Tobacco Use   Smoking status: Every Day    Packs/day: 0.50    Years: 18.00    Total pack years: 9.00    Types: Cigarettes    Start date: 06/21/2018   Smokeless tobacco: Never  Vaping Use   Vaping Use: Never used  Substance and Sexual  Activity   Alcohol use: No   Drug use: No   Sexual activity: Yes    Birth control/protection: Injection, Other-see comments    Comment: has had hysterectomy  Other Topics Concern   Not on file  Social History Narrative   Lives at home with husband and daughter   Social Determinants of Health   Financial Resource Strain: Not on file  Food Insecurity: Not on file  Transportation Needs: Not on file  Physical Activity: Not on file  Stress: Not on file  Social Connections: Not on file  Intimate Partner Violence: Not on file  Family History  Problem Relation Age of Onset   Diabetes Father    Hypertension Father    Hyperlipidemia Father    Heart attack Father 73       "mild"   Melanoma Maternal Aunt        other aunts with BCC/SCC/Melanoma   Cervical cancer Maternal Aunt 64       daughter w/ cervical cancer as well   Lung cancer Maternal Aunt    Melanoma Maternal Uncle        other uncles with BCC/SCC/Melanoma   Breast cancer Paternal Aunt    Bladder Cancer Maternal Grandmother   Biological mother had Grave's disease.    Current Outpatient Medications:    acetaminophen (TYLENOL) 500 MG tablet, Take 500 mg by mouth every 6 (six) hours as needed., Disp: , Rfl:    albuterol (VENTOLIN HFA) 108 (90 Base) MCG/ACT inhaler, Inhale 2 puffs into the lungs every 6 (six) hours as needed for wheezing or shortness of breath. , Disp: , Rfl:    CALCIUM CARBONATE-VITAMIN D PO, Take 600-800 mg by mouth in the morning, at noon, and at bedtime., Disp: , Rfl:    chlorhexidine (PERIDEX) 0.12 % solution, Use as directed 15 mLs in the mouth or throat 2 (two) times daily., Disp: 120 mL, Rfl: 0   cyanocobalamin (,VITAMIN B-12,) 1000 MCG/ML injection, Inject 1,000 mcg into the skin every 30 (thirty) days., Disp: , Rfl:    cyclobenzaprine (FLEXERIL) 5 MG tablet, Take 5 mg by mouth 3 (three) times daily as needed for muscle spasms., Disp: , Rfl:    diphenoxylate-atropine (LOMOTIL) 2.5-0.025 MG tablet,  Take 1 tablet by mouth 4 (four) times daily as needed for diarrhea or loose stools., Disp: 60 tablet, Rfl: 0   escitalopram (LEXAPRO) 20 MG tablet, Take 20 mg by mouth daily., Disp: , Rfl:    esomeprazole (NEXIUM) 40 MG capsule, Take 40 mg by mouth daily before breakfast. , Disp: , Rfl:    Eszopiclone 3 MG TABS, Take 3 mg by mouth at bedtime as needed (sleep). , Disp: , Rfl:    folic acid (FOLVITE) 1 MG tablet, Take 1 mg by mouth daily., Disp: , Rfl:    hydroxychloroquine (PLAQUENIL) 200 MG tablet, Take 200 mg by mouth daily., Disp: , Rfl:    ibuprofen (ADVIL) 800 MG tablet, Take 800 mg by mouth every 8 (eight) hours as needed for moderate pain., Disp: , Rfl:    loperamide (IMODIUM) 2 MG capsule, Take 1 tablet (2 mg total) by mouth See admin instructions. Take 2 tablets with onset of diarrhea, then take 1 tablet every 2 hours until diarrhea stops. Maximum 8 tablets in 24, Disp: 90 capsule, Rfl: 0   loratadine (CLARITIN) 10 MG tablet, Take 10 mg by mouth daily. , Disp: , Rfl:    LORazepam (ATIVAN) 1 MG tablet, Take 1 mg by mouth 3 (three) times daily., Disp: , Rfl:    Magnesium 500 MG TABS, Take 500 mg by mouth 2 (two) times daily., Disp: , Rfl:    methotrexate (RHEUMATREX) 2.5 MG tablet, Take 25 mg by mouth every Sunday. 10 tablets once a week, Disp: , Rfl:    mupirocin ointment (BACTROBAN) 2 %, Place 1 application into the nose 2 (two) times daily. Use in each nostril twice daily for five (5) days., Disp: 22 g, Rfl: 5   nortriptyline (PAMELOR) 10 MG capsule, Take 30 mg by mouth at bedtime. , Disp: , Rfl:    oxyCODONE (OXY IR/ROXICODONE)  5 MG immediate release tablet, Take 1 tablet (5 mg total) by mouth every 6 (six) hours as needed for moderate pain or severe pain., Disp: 90 tablet, Rfl: 0   phentermine (ADIPEX-P) 37.5 MG tablet, Take 37.5 mg by mouth daily before breakfast. , Disp: , Rfl:    predniSONE (DELTASONE) 5 MG tablet, Take 5 mg by mouth daily with breakfast., Disp: , Rfl:    pregabalin  (LYRICA) 150 MG capsule, Take 150 mg by mouth 2 (two) times daily., Disp: , Rfl:    promethazine (PHENERGAN) 25 MG tablet, Take 1 tablet (25 mg total) by mouth every 8 (eight) hours as needed for nausea or vomiting., Disp: 90 tablet, Rfl: 0   pyridOXINE (VITAMIN B-6) 100 MG tablet, Take 100 mg by mouth daily., Disp: , Rfl:    sulfaSALAzine (AZULFIDINE) 500 MG EC tablet, Take by mouth., Disp: , Rfl:    topiramate (TOPAMAX) 50 MG tablet, Take 50 mg by mouth 2 (two) times daily. , Disp: , Rfl:    vitamin C (ASCORBIC ACID) 500 MG tablet, Take 500 mg by mouth daily., Disp: , Rfl:  No current facility-administered medications for this visit.  Facility-Administered Medications Ordered in Other Visits:    acetaminophen (TYLENOL) tablet 650 mg, 650 mg, Oral, Once, Earlie Server, MD   diphenhydrAMINE (BENADRYL) capsule 50 mg, 50 mg, Oral, Once, Earlie Server, MD   heparin lock flush 100 unit/mL, 500 Units, Intravenous, Once, Earlie Server, MD   sodium chloride flush (NS) 0.9 % injection 10 mL, 10 mL, Intravenous, PRN, Earlie Server, MD, 10 mL at 11/19/18 1249   sodium chloride flush (NS) 0.9 % injection 10 mL, 10 mL, Intravenous, Once, Earlie Server, MD   sodium chloride flush (NS) 0.9 % injection 10 mL, 10 mL, Intracatheter, PRN, Earlie Server, MD, 10 mL at 12/09/22 1321   Physical exam:  Vitals:   12/09/22 0914  BP: 114/77  Pulse: (!) 111  Temp: (!) 97.2 F (36.2 C)  TempSrc: Tympanic  SpO2: 100%  Weight: 174 lb 3.2 oz (79 kg)  ECOG 1 Physical Exam Constitutional:      General: She is not in acute distress.    Appearance: She is not diaphoretic.  HENT:     Head: Normocephalic and atraumatic.     Nose: Nose normal.  Eyes:     General: No scleral icterus. Neck:     Vascular: No JVD.  Cardiovascular:     Rate and Rhythm: Normal rate.  Pulmonary:     Effort: Pulmonary effort is normal. No respiratory distress.     Breath sounds: Normal breath sounds. No wheezing.  Abdominal:     General: There is no distension.      Palpations: Abdomen is soft.  Musculoskeletal:        General: Normal range of motion.     Cervical back: Normal range of motion.  Lymphadenopathy:     Cervical: No cervical adenopathy.  Skin:    General: Skin is warm.     Findings: No erythema or rash.  Neurological:     Mental Status: She is alert and oriented to person, place, and time. Mental status is at baseline.     Cranial Nerves: No cranial nerve deficit.     Motor: No abnormal muscle tone.     Coordination: Coordination normal.  Psychiatric:        Mood and Affect: Mood and affect normal.        Labs     Latest Ref  Rng & Units 12/09/2022    8:49 AM  CMP  Glucose 70 - 99 mg/dL 125   BUN 6 - 20 mg/dL 17   Creatinine 0.44 - 1.00 mg/dL 0.81   Sodium 135 - 145 mmol/L 139   Potassium 3.5 - 5.1 mmol/L 3.3   Chloride 98 - 111 mmol/L 110   CO2 22 - 32 mmol/L 22   Calcium 8.9 - 10.3 mg/dL 8.5   Total Protein 6.5 - 8.1 g/dL 7.4   Total Bilirubin 0.3 - 1.2 mg/dL 0.3   Alkaline Phos 38 - 126 U/L 64   AST 15 - 41 U/L 29   ALT 0 - 44 U/L 19       Latest Ref Rng & Units 12/09/2022    8:49 AM  CBC  WBC 4.0 - 10.5 K/uL 5.3   Hemoglobin 12.0 - 15.0 g/dL 11.1   Hematocrit 36.0 - 46.0 % 34.1   Platelets 150 - 400 K/uL 243    RADIOGRAPHIC STUDIES: I have personally reviewed the radiological images as listed and agreed with the findings in the report. ECHOCARDIOGRAM LIMITED  Result Date: 11/25/2022    ECHOCARDIOGRAM LIMITED REPORT   Patient Name:   PERSIA LINTNER Date of Exam: 11/25/2022 Medical Rec #:  [id number]      Height:       67.0 in Accession #:    [id number]     Weight:       175.3 lb Date of Birth:  01/21/1981      BSA:          1.912 m Patient Age:    4 years       BP:           121/74 mmHg Patient Gender: F              HR:           92 bpm. Exam Location:  [place] Procedure: Limited Echo, Limited Color Doppler and Intracardiac Opacification            Agent Indications:    I42.80 Non-ischemic  cardiomyopathy  History:        Patient has prior history of Echocardiogram examinations, most                 recent 08/23/2022. Cardiomyopathy, COPD, Arrythmias:Tachycardia;                 Signs/Symptoms:Shortness of Breath and Murmur. Currently                 receiving chemotherapy.  Sonographer:    Pilar Jarvis RDMS, RVT, RDCS Referring Phys: [id number] CHRI[facility] END  Sonographer Comments: Markedly suboptimal tracking on GLS and DHM images IMPRESSIONS  1. Left ventricular ejection fraction, by estimation, is 50 to 55%. The left ventricle has low normal function. The left ventricle has no regional wall motion abnormalities. Left ventricular diastolic parameters are indeterminate. The average left ventricular global longitudinal strain is -10.5 %. The global longitudinal strain is abnormal.  2. Right ventricular systolic function is normal. The right ventricular size is normal.  3. The mitral valve is normal in structure. No evidence of mitral valve regurgitation. No evidence of mitral stenosis.  4. The aortic valve was not well visualized. Aortic valve regurgitation is not visualized. No aortic stenosis is present.  5. The inferior vena cava is normal in size with greater than 50% respiratory variability, suggesting right atrial pressure of 3 mmHg. FINDINGS  Left Ventricle: Left ventricular  ejection fraction, by estimation, is 50 to 55%. The left ventricle has low normal function. The left ventricle has no regional wall motion abnormalities. The average left ventricular global longitudinal strain is -10.5 %. The global longitudinal strain is abnormal. The left ventricular internal cavity size was normal in size. There is no left ventricular hypertrophy. Left ventricular diastolic parameters are indeterminate. Right Ventricle: The right ventricular size is normal. No increase in right ventricular wall thickness. Right ventricular systolic function is normal. Left Atrium: Left atrial size was normal in size. Right  Atrium: Right atrial size was normal in size. Pericardium: There is no evidence of pericardial effusion. Mitral Valve: The mitral valve is normal in structure. No evidence of mitral valve stenosis. Tricuspid Valve: The tricuspid valve is normal in structure. Tricuspid valve regurgitation is not demonstrated. No evidence of tricuspid stenosis. Aortic Valve: The aortic valve was not well visualized. Aortic valve regurgitation is not visualized. No aortic stenosis is present. Pulmonic Valve: The pulmonic valve was normal in structure. Pulmonic valve regurgitation is not visualized. No evidence of pulmonic stenosis. Aorta: The aortic root is normal in size and structure. Venous: The inferior vena cava is normal in size with greater than 50% respiratory variability, suggesting right atrial pressure of 3 mmHg. IAS/Shunts: No atrial level shunt detected by color flow Doppler. Additional Comments: Color Doppler performed.  LEFT VENTRICLE PLAX 2D LVIDd:         4.75 cm   Diastology LVIDs:         3.45 cm   LV e' medial:    5.77 cm/s LV PW:         0.60 cm   LV E/e' medial:  13.9 LV IVS:        0.60 cm   LV e' lateral:   15.10 cm/s LVOT diam:     1.90 cm   LV E/e' lateral: 5.3 LVOT Area:     2.84 cm                          2D Longitudinal Strain                          2D Strain GLS Avg:     -10.5 % RIGHT VENTRICLE RV S prime:     11.30 cm/s TAPSE (M-mode): 2.3 cm LEFT ATRIUM         Index LA diam:    3.70 cm 1.94 cm/m   AORTA Ao Root diam: 2.70 cm MITRAL VALVE MV Area (PHT): 4.74 cm    SHUNTS MV Decel Time: 160 msec    Systemic Diam: 1.90 cm MV E velocity: 80.10 cm/s Ida Rogue MD Electronically signed by Ida Rogue MD Signature Date/Time: 11/25/2022/11:53:32 AM    Final    NM PET Image Restag (PS) Skull Base To Thigh  Result Date: 10/06/2022 CLINICAL DATA:  Subsequent treatment strategy for breast cancer. EXAM: NUCLEAR MEDICINE PET SKULL BASE TO THIGH TECHNIQUE: 9.82 mCi F-18 FDG was injected  intravenously. Full-ring PET imaging was performed from the skull base to thigh after the radiotracer. CT data was obtained and used for attenuation correction and anatomic localization. Fasting blood glucose: 96 mg/dl COMPARISON:  Multiple priors including most recent PET-CT March 16, 2022 FINDINGS: Mediastinal blood pool activity: SUV max 1.5 Liver activity: SUV max NA NECK: No hypermetabolic cervical adenopathy. Incidental CT findings: None. CHEST: No hypermetabolic thoracic adenopathy. No hypermetabolic pulmonary nodules or  masses. Status post bilateral mastectomies without hypermetabolic chest wall lesions to suggest recurrent breast cancer. Incidental CT findings: Right chest Port-A-Cath with tip in the right atrium. ABDOMEN/PELVIS: No abnormal hypermetabolic activity within the liver, pancreas, adrenal glands, or spleen. No hypermetabolic lymph nodes in the abdomen or pelvis. Incidental CT findings: None. SKELETON: No focal hypermetabolic activity to suggest skeletal metastasis. Persistent minimally metabolic stranding in the bilateral posterior gluteal soft tissues with a max SUV of 1.4. Incidental CT findings: None. IMPRESSION: Stable examination without evidence of hypermetabolic recurrence or metastatic disease. Persistent minimally metabolic stranding in the posterior bilateral gluteal soft tissues may reflect sequela of subcutaneous injections. Electronically Signed   By: Dahlia Bailiff M.D.   On: 10/06/2022 17:14

## 2022-12-09 NOTE — Assessment & Plan Note (Signed)
Continue calcium supplementation '600mg'$  TID.  IV calcium gluconate 2 g x 1 today.

## 2022-12-09 NOTE — Assessment & Plan Note (Signed)
Left lobe nodules are small no need for follow-up.   Right lobe nodule 1.1 cm, repeat thyroid ultrasound

## 2022-12-13 ENCOUNTER — Ambulatory Visit
Admission: RE | Admit: 2022-12-13 | Discharge: 2022-12-13 | Disposition: A | Discharge status: Home / Self Care | Payer: Medicare HMO | Source: Ambulatory Visit | Attending: Oncology | Admitting: Oncology

## 2022-12-13 DIAGNOSIS — E041 Nontoxic single thyroid nodule: Secondary | ICD-10-CM | POA: Diagnosis present

## 2022-12-15 ENCOUNTER — Encounter: Payer: Self-pay | Admitting: Oncology

## 2022-12-15 ENCOUNTER — Other Ambulatory Visit: Payer: Self-pay

## 2022-12-15 DIAGNOSIS — E041 Nontoxic single thyroid nodule: Secondary | ICD-10-CM

## 2022-12-15 NOTE — Telephone Encounter (Signed)
Sorry, I thought I put order in. Its in now

## 2022-12-15 NOTE — Telephone Encounter (Signed)
Theresa Chaney, can you follow up on thyroid US scheduling please

## 2022-12-23 ENCOUNTER — Inpatient Hospital Stay: Payer: Medicare HMO | Attending: Oncology

## 2022-12-23 ENCOUNTER — Inpatient Hospital Stay: Payer: Medicare HMO

## 2022-12-23 VITALS — BP 104/78 | HR 98 | Temp 97.3°F | Resp 15 | Wt 179.0 lb

## 2022-12-23 DIAGNOSIS — C50411 Malignant neoplasm of upper-outer quadrant of right female breast: Secondary | ICD-10-CM | POA: Diagnosis present

## 2022-12-23 DIAGNOSIS — C7951 Secondary malignant neoplasm of bone: Secondary | ICD-10-CM | POA: Insufficient documentation

## 2022-12-23 DIAGNOSIS — Z79899 Other long term (current) drug therapy: Secondary | ICD-10-CM | POA: Insufficient documentation

## 2022-12-23 DIAGNOSIS — Z5111 Encounter for antineoplastic chemotherapy: Secondary | ICD-10-CM | POA: Diagnosis present

## 2022-12-23 DIAGNOSIS — Z95828 Presence of other vascular implants and grafts: Secondary | ICD-10-CM

## 2022-12-23 DIAGNOSIS — Z170 Estrogen receptor positive status [ER+]: Secondary | ICD-10-CM

## 2022-12-23 DIAGNOSIS — Z5112 Encounter for antineoplastic immunotherapy: Secondary | ICD-10-CM | POA: Insufficient documentation

## 2022-12-23 LAB — BASIC METABOLIC PANEL
Anion gap: 8 (ref 5–15)
BUN: 20 mg/dL (ref 6–20)
CO2: 23 mmol/L (ref 22–32)
Calcium: 8.5 mg/dL — ABNORMAL LOW (ref 8.9–10.3)
Chloride: 107 mmol/L (ref 98–111)
Creatinine, Ser: 0.91 mg/dL (ref 0.44–1.00)
GFR, Estimated: >60 mL/min (ref >60)
Glucose, Bld: 109 mg/dL — ABNORMAL HIGH (ref 70–99)
Potassium: 3.5 mmol/L (ref 3.5–5.1)
Sodium: 138 mmol/L (ref 135–145)

## 2022-12-23 MED ORDER — FULVESTRANT 250 MG/5ML IM SOSY
500.0000 mg | PREFILLED_SYRINGE | INTRAMUSCULAR | Status: DC
  Administered 2022-12-23: 500 mg via INTRAMUSCULAR
  Filled 2022-12-23: qty 10

## 2022-12-23 MED ORDER — HEPARIN SOD (PORK) LOCK FLUSH 100 UNIT/ML IV SOLN
500.0000 [IU] | Freq: Once | INTRAVENOUS | Status: AC
  Administered 2022-12-23: 500 [IU] via INTRAVENOUS
  Filled 2022-12-23: qty 5

## 2022-12-23 MED ORDER — ZOLEDRONIC ACID 4 MG/100ML IV SOLN: 4.0000 mg | Freq: Once | INTRAVENOUS | Status: DC

## 2022-12-23 MED ORDER — SODIUM CHLORIDE 0.9% FLUSH
10.0000 mL | Freq: Once | INTRAVENOUS | Status: AC
  Administered 2022-12-23: 10 mL via INTRAVENOUS
  Filled 2022-12-23: qty 10

## 2022-12-23 MED ORDER — ALTEPLASE 2 MG IJ SOLR
2.0000 mg | Freq: Once | INTRAMUSCULAR | Status: AC
  Administered 2022-12-23: 2 mg
  Filled 2022-12-23: qty 2

## 2022-12-23 NOTE — Progress Notes (Signed)
Per Dr. Tasia Catchings, hold Zometa today due to serum calcium 8.5.

## 2022-12-30 ENCOUNTER — Ambulatory Visit: Payer: Medicare HMO | Admitting: Oncology

## 2022-12-30 ENCOUNTER — Inpatient Hospital Stay: Payer: Medicare HMO

## 2022-12-30 VITALS — BP 105/66 | HR 99 | Temp 97.2°F | Resp 18

## 2022-12-30 DIAGNOSIS — Z5111 Encounter for antineoplastic chemotherapy: Secondary | ICD-10-CM | POA: Diagnosis not present

## 2022-12-30 DIAGNOSIS — C50811 Malignant neoplasm of overlapping sites of right female breast: Secondary | ICD-10-CM

## 2022-12-30 DIAGNOSIS — E876 Hypokalemia: Secondary | ICD-10-CM

## 2022-12-30 LAB — CBC WITH DIFFERENTIAL/PLATELET
Abs Immature Granulocytes: 0.03 10*3/uL (ref 0.00–0.07)
Basophils Absolute: 0.1 10*3/uL (ref 0.0–0.1)
Basophils Relative: 1 %
Eosinophils Absolute: 0.1 10*3/uL (ref 0.0–0.5)
Eosinophils Relative: 1 %
HCT: 30.6 % — ABNORMAL LOW (ref 36.0–46.0)
Hemoglobin: 10 g/dL — ABNORMAL LOW (ref 12.0–15.0)
Immature Granulocytes: 0 %
Lymphocytes Relative: 20 %
Lymphs Abs: 1.6 10*3/uL (ref 0.7–4.0)
MCH: 29.8 pg (ref 26.0–34.0)
MCHC: 32.7 g/dL (ref 30.0–36.0)
MCV: 91.1 fL (ref 80.0–100.0)
Monocytes Absolute: 0.7 10*3/uL (ref 0.1–1.0)
Monocytes Relative: 9 %
Neutro Abs: 5.5 10*3/uL (ref 1.7–7.7)
Neutrophils Relative %: 69 %
Platelets: 279 10*3/uL (ref 150–400)
RBC: 3.36 MIL/uL — ABNORMAL LOW (ref 3.87–5.11)
RDW: 13.7 % (ref 11.5–15.5)
WBC: 8 10*3/uL (ref 4.0–10.5)
nRBC: 0 % (ref 0.0–0.2)

## 2022-12-30 LAB — COMPREHENSIVE METABOLIC PANEL
ALT: 26 U/L (ref 0–44)
AST: 33 U/L (ref 15–41)
Albumin: 4.2 g/dL (ref 3.5–5.0)
Alkaline Phosphatase: 58 U/L (ref 38–126)
Anion gap: 7 (ref 5–15)
BUN: 18 mg/dL (ref 6–20)
CO2: 24 mmol/L (ref 22–32)
Calcium: 8.4 mg/dL — ABNORMAL LOW (ref 8.9–10.3)
Chloride: 108 mmol/L (ref 98–111)
Creatinine, Ser: 0.91 mg/dL (ref 0.44–1.00)
GFR, Estimated: >60 mL/min (ref >60)
Glucose, Bld: 111 mg/dL — ABNORMAL HIGH (ref 70–99)
Potassium: 3.1 mmol/L — ABNORMAL LOW (ref 3.5–5.1)
Sodium: 139 mmol/L (ref 135–145)
Total Bilirubin: 0.4 mg/dL (ref 0.3–1.2)
Total Protein: 7.2 g/dL (ref 6.5–8.1)

## 2022-12-30 MED ORDER — SODIUM CHLORIDE 0.9 % IV SOLN
420.0000 mg | Freq: Once | INTRAVENOUS | Status: AC
  Administered 2022-12-30: 420 mg via INTRAVENOUS
  Filled 2022-12-30: qty 14

## 2022-12-30 MED ORDER — DIPHENHYDRAMINE HCL 25 MG PO CAPS: 50.0000 mg | ORAL_CAPSULE | Freq: Once | ORAL | Status: DC

## 2022-12-30 MED ORDER — ACETAMINOPHEN 325 MG PO TABS: 650.0000 mg | ORAL_TABLET | Freq: Once | ORAL | Status: DC

## 2022-12-30 MED ORDER — TRASTUZUMAB-ANNS CHEMO 150 MG IV SOLR
6.0000 mg/kg | Freq: Once | INTRAVENOUS | Status: AC
  Administered 2022-12-30: 462 mg via INTRAVENOUS
  Filled 2022-12-30: qty 22

## 2022-12-30 MED ORDER — SODIUM CHLORIDE 0.9 % IV SOLN
2.0000 g | Freq: Once | INTRAVENOUS | Status: AC
  Administered 2022-12-30: 2 g via INTRAVENOUS
  Filled 2022-12-30: qty 20

## 2022-12-30 MED ORDER — SODIUM CHLORIDE 0.9 % IV SOLN
Freq: Once | INTRAVENOUS | Status: AC
  Filled 2022-12-30: qty 250

## 2022-12-30 MED ORDER — POTASSIUM CHLORIDE 20 MEQ/100ML IV SOLN
20.0000 meq | Freq: Once | INTRAVENOUS | Status: AC
  Administered 2022-12-30: 20 meq via INTRAVENOUS

## 2022-12-30 NOTE — Patient Instructions (Signed)
[facility] AT [place]  Discharge Instructions: Thank you for choosing [facility] to provide your oncology and hematology care.  If you have a lab appointment with the [facility], please go directly to the [facility] and check in at the registration area.  Wear comfortable clothing and clothing appropriate for easy access to any Portacath or PICC line.   We strive to give you quality time with your provider. You may need to reschedule your appointment if you arrive late (15 or more minutes).  Arriving late affects you and other patients whose appointments are after yours.  Also, if you miss three or more appointments without notifying the office, you may be dismissed from the clinic at the provider's discretion.      For prescription refill requests, have your pharmacy contact our office and allow 72 hours for refills to be completed.    Today you received the following chemotherapy and/or immunotherapy agents PERJETA and KANJINTI      To help prevent nausea and vomiting after your treatment, we encourage you to take your nausea medication as directed.  BELOW ARE SYMPTOMS THAT SHOULD BE REPORTED IMMEDIATELY: *FEVER GREATER THAN 100.4 F (38 C) OR HIGHER *CHILLS OR SWEATING *NAUSEA AND VOMITING THAT IS NOT CONTROLLED WITH YOUR NAUSEA MEDICATION *UNUSUAL SHORTNESS OF BREATH *UNUSUAL BRUISING OR BLEEDING *URINARY PROBLEMS (pain or burning when urinating, or frequent urination) *BOWEL PROBLEMS (unusual diarrhea, constipation, pain near the anus) TENDERNESS IN MOUTH AND THROAT WITH OR WITHOUT PRESENCE OF ULCERS (sore throat, sores in mouth, or a toothache) UNUSUAL RASH, SWELLING OR PAIN  UNUSUAL VAGINAL DISCHARGE OR ITCHING   Items with * indicate a potential emergency and should be followed up as soon as possible or go to the Emergency Department if any problems should occur.  Please show the CHEMOTHERAPY ALERT CARD or IMMUNOTHERAPY ALERT CARD at  check-in to the Emergency Department and triage nurse.  Should you have questions after your visit or need to cancel or reschedule your appointment, please contact [facility] [place] AT [place]  [phone] and follow the prompts.  Office hours are 8:00 a.m. to 4:30 p.m. Monday - Friday. Please note that voicemails left after 4:00 p.m. may not be returned until the following business day.  We are closed weekends and major holidays. You have access to a nurse at all times for urgent questions. Please call the main number to the clinic [phone] and follow the prompts.  For any non-urgent questions, you may also contact your provider using MyChart. We now offer e-Visits for anyone 55 and older to request care online for non-urgent symptoms. For details visit [email or web address].   Also download the MyChart app! Go to the app store, search "MyChart", open the app, select [facility], and log in with your MyChart username and password.  Pertuzumab Injection What is this medication? PERTUZUMAB (per TOOZ ue mab) treats breast cancer. It works by blocking a protein that causes cancer cells to grow and multiply. This helps to slow or stop the spread of cancer cells. It is a monoclonal antibody. This medicine may be used for other purposes; ask your health care provider or pharmacist if you have questions. COMMON BRAND NAME(S): PERJETA What should I tell my care team before I take this medication? They need to know if you have any of these conditions: Heart failure An unusual or allergic reaction to pertuzumab, other medications, foods, dyes, or preservatives Pregnant or trying to get pregnant Breast-feeding How should  I use this medication? This medication is injected into a vein. It is given by your care team in a hospital or clinic setting. Talk to your care team about the use of this medication in children. Special care may be needed. Overdosage: If you think you have taken  too much of this medicine contact a poison control center or emergency room at once. NOTE: This medicine is only for you. Do not share this medicine with others. What if I miss a dose? Keep appointments for follow-up doses. It is important not to miss your dose. Call your care team if you are unable to keep an appointment. What may interact with this medication? Interactions are not expected. This list may not describe all possible interactions. Give your health care provider a list of all the medicines, herbs, non-prescription drugs, or dietary supplements you use. Also tell them if you smoke, drink alcohol, or use illegal drugs. Some items may interact with your medicine. What should I watch for while using this medication? Your condition will be monitored carefully while you are receiving this medication. This medication may make you feel generally unwell. This is not uncommon as chemotherapy can affect healthy cells as well as cancer cells. Report any side effects. Continue your course of treatment even though you feel ill unless your care team tells you to stop. Talk to your care team if you may be pregnant. Serious birth defects can occur if you take this medication during pregnancy and for 7 months after the last dose. You will need a negative pregnancy test before starting this medication. Contraception is recommended while taking this medication and for 7 months after the last dose. Your care team can help you find the option that works for you. Do not breastfeed while taking this medication and for 7 months after the last dose. What side effects may I notice from receiving this medication? Side effects that you should report to your care team as soon as possible: Allergic reactions or angioedema--skin rash, itching or hives, swelling of the face, eyes, lips, tongue, arms, or legs, trouble swallowing or breathing Heart failure--shortness of breath, swelling of the ankles, feet, or hands,  sudden weight gain, unusual weakness or fatigue Infusion reactions--chest pain, shortness of breath or trouble breathing, feeling faint or lightheaded Side effects that usually do not require medical attention (report to your care team if they continue or are bothersome): Diarrhea Dry skin Fatigue Hair loss Nausea Vomiting This list may not describe all possible side effects. Call your doctor for medical advice about side effects. You may report side effects to FDA at 1-800-FDA-1088. Where should I keep my medication? This medication is given in a hospital or clinic. It will not be stored at home. NOTE: This sheet is a summary. It may not cover all possible information. If you have questions about this medicine, talk to your doctor, pharmacist, or health care provider.  2023 Elsevier/Gold Standard (2022-04-12 00:00:00)   Trastuzumab Injection What is this medication? TRASTUZUMAB (tras TOO zoo mab) treats breast cancer and stomach cancer. It works by blocking a protein that causes cancer cells to grow and multiply. This helps to slow or stop the spread of cancer cells. This medicine may be used for other purposes; ask your health care provider or pharmacist if you have questions. COMMON BRAND NAME(S): Herceptin, Janae Bridgeman, Ontruzant, Trazimera What should I tell my care team before I take this medication? They need to know if you have any of these  conditions: Heart failure Lung disease An unusual or allergic reaction to trastuzumab, other medications, foods, dyes, or preservatives Pregnant or trying to get pregnant Breast-feeding How should I use this medication? This medication is injected into a vein. It is given by your care team in a hospital or clinic setting. Talk to your care team about the use of this medication in children. It is not approved for use in children. Overdosage: If you think you have taken too much of this medicine contact a poison control center  or emergency room at once. NOTE: This medicine is only for you. Do not share this medicine with others. What if I miss a dose? Keep appointments for follow-up doses. It is important not to miss your dose. Call your care team if you are unable to keep an appointment. What may interact with this medication? Certain types of chemotherapy, such as daunorubicin, doxorubicin, epirubicin, idarubicin This list may not describe all possible interactions. Give your health care provider a list of all the medicines, herbs, non-prescription drugs, or dietary supplements you use. Also tell them if you smoke, drink alcohol, or use illegal drugs. Some items may interact with your medicine. What should I watch for while using this medication? Your condition will be monitored carefully while you are receiving this medication. This medication may make you feel generally unwell. This is not uncommon, as chemotherapy affects healthy cells as well as cancer cells. Report any side effects. Continue your course of treatment even though you feel ill unless your care team tells you to stop. This medication may increase your risk of getting an infection. Call your care team for advice if you get a fever, chills, sore throat, or other symptoms of a cold or flu. Do not treat yourself. Try to avoid being around people who are sick. Avoid taking medications that contain aspirin, acetaminophen, ibuprofen, naproxen, or ketoprofen unless instructed by your care team. These medications can hide a fever. Talk to your care team if you may be pregnant. Serious birth defects can occur if you take this medication during pregnancy and for 7 months after the last dose. You will need a negative pregnancy test before starting this medication. Contraception is recommended while taking this medication and for 7 months after the last dose. Your care team can help you find the option that works for you. Do not breastfeed while taking this medication  and for 7 months after stopping treatment. What side effects may I notice from receiving this medication? Side effects that you should report to your care team as soon as possible: Allergic reactions or angioedema--skin rash, itching or hives, swelling of the face, eyes, lips, tongue, arms, or legs, trouble swallowing or breathing Dry cough, shortness of breath or trouble breathing Heart failure--shortness of breath, swelling of the ankles, feet, or hands, sudden weight gain, unusual weakness or fatigue Infection--fever, chills, cough, or sore throat Infusion reactions--chest pain, shortness of breath or trouble breathing, feeling faint or lightheaded Side effects that usually do not require medical attention (report to your care team if they continue or are bothersome): Diarrhea Dizziness Headache Nausea Trouble sleeping Vomiting This list may not describe all possible side effects. Call your doctor for medical advice about side effects. You may report side effects to FDA at 1-800-FDA-1088. Where should I keep my medication? This medication is given in a hospital or clinic. It will not be stored at home. NOTE: This sheet is a summary. It may not cover all possible information. If  you have questions about this medicine, talk to your doctor, pharmacist, or health care provider.  2023 Elsevier/Gold Standard (2022-03-31 00:00:00)

## 2023-01-20 ENCOUNTER — Other Ambulatory Visit: Payer: Self-pay

## 2023-01-20 ENCOUNTER — Ambulatory Visit
Admission: RE | Admit: 2023-01-20 | Discharge: 2023-01-20 | Disposition: A | Discharge status: Home / Self Care | Payer: Medicare HMO | Attending: Oncology | Admitting: Oncology

## 2023-01-20 ENCOUNTER — Inpatient Hospital Stay (HOSPITAL_BASED_OUTPATIENT_CLINIC_OR_DEPARTMENT_OTHER): Payer: Medicare HMO | Admitting: Oncology

## 2023-01-20 ENCOUNTER — Inpatient Hospital Stay: Payer: Medicare HMO | Attending: Oncology

## 2023-01-20 ENCOUNTER — Inpatient Hospital Stay: Payer: Medicare HMO

## 2023-01-20 ENCOUNTER — Encounter: Payer: Self-pay | Admitting: Oncology

## 2023-01-20 ENCOUNTER — Ambulatory Visit
Admission: RE | Admit: 2023-01-20 | Discharge: 2023-01-20 | Disposition: A | Discharge status: Home / Self Care | Payer: Medicare HMO | Source: Ambulatory Visit | Attending: Oncology | Admitting: Oncology

## 2023-01-20 VITALS — BP 123/75 | HR 110 | Temp 97.9°F | Resp 16 | Wt 184.0 lb

## 2023-01-20 VITALS — BP 123/75 | HR 99 | Temp 97.9°F | Resp 16 | Wt 184.4 lb

## 2023-01-20 DIAGNOSIS — C7951 Secondary malignant neoplasm of bone: Secondary | ICD-10-CM | POA: Insufficient documentation

## 2023-01-20 DIAGNOSIS — Z79899 Other long term (current) drug therapy: Secondary | ICD-10-CM | POA: Diagnosis not present

## 2023-01-20 DIAGNOSIS — C50411 Malignant neoplasm of upper-outer quadrant of right female breast: Secondary | ICD-10-CM | POA: Diagnosis present

## 2023-01-20 DIAGNOSIS — Z5111 Encounter for antineoplastic chemotherapy: Secondary | ICD-10-CM | POA: Insufficient documentation

## 2023-01-20 DIAGNOSIS — Z79818 Long term (current) use of other agents affecting estrogen receptors and estrogen levels: Secondary | ICD-10-CM | POA: Insufficient documentation

## 2023-01-20 DIAGNOSIS — T451X5A Adverse effect of antineoplastic and immunosuppressive drugs, initial encounter: Secondary | ICD-10-CM

## 2023-01-20 DIAGNOSIS — Z170 Estrogen receptor positive status [ER+]: Secondary | ICD-10-CM | POA: Diagnosis not present

## 2023-01-20 DIAGNOSIS — Z5112 Encounter for antineoplastic immunotherapy: Secondary | ICD-10-CM | POA: Insufficient documentation

## 2023-01-20 DIAGNOSIS — E876 Hypokalemia: Secondary | ICD-10-CM | POA: Diagnosis not present

## 2023-01-20 DIAGNOSIS — M0520 Rheumatoid vasculitis with rheumatoid arthritis of unspecified site: Secondary | ICD-10-CM

## 2023-01-20 DIAGNOSIS — G620 Drug-induced polyneuropathy: Secondary | ICD-10-CM

## 2023-01-20 DIAGNOSIS — I428 Other cardiomyopathies: Secondary | ICD-10-CM

## 2023-01-20 DIAGNOSIS — E041 Nontoxic single thyroid nodule: Secondary | ICD-10-CM | POA: Insufficient documentation

## 2023-01-20 DIAGNOSIS — M25511 Pain in right shoulder: Secondary | ICD-10-CM

## 2023-01-20 LAB — COMPREHENSIVE METABOLIC PANEL
ALT: 21 U/L (ref 0–44)
AST: 29 U/L (ref 15–41)
Albumin: 4.1 g/dL (ref 3.5–5.0)
Alkaline Phosphatase: 64 U/L (ref 38–126)
Anion gap: 9 (ref 5–15)
BUN: 22 mg/dL — ABNORMAL HIGH (ref 6–20)
CO2: 23 mmol/L (ref 22–32)
Calcium: 8.3 mg/dL — ABNORMAL LOW (ref 8.9–10.3)
Chloride: 105 mmol/L (ref 98–111)
Creatinine, Ser: 0.97 mg/dL (ref 0.44–1.00)
GFR, Estimated: >60 mL/min (ref >60)
Glucose, Bld: 114 mg/dL — ABNORMAL HIGH (ref 70–99)
Potassium: 3.5 mmol/L (ref 3.5–5.1)
Sodium: 137 mmol/L (ref 135–145)
Total Bilirubin: 0.2 mg/dL — ABNORMAL LOW (ref 0.3–1.2)
Total Protein: 7.4 g/dL (ref 6.5–8.1)

## 2023-01-20 LAB — CBC WITH DIFFERENTIAL/PLATELET
Abs Immature Granulocytes: 0.03 10*3/uL (ref 0.00–0.07)
Basophils Absolute: 0.1 10*3/uL (ref 0.0–0.1)
Basophils Relative: 1 %
Eosinophils Absolute: 0.1 10*3/uL (ref 0.0–0.5)
Eosinophils Relative: 1 %
HCT: 32.9 % — ABNORMAL LOW (ref 36.0–46.0)
Hemoglobin: 10.8 g/dL — ABNORMAL LOW (ref 12.0–15.0)
Immature Granulocytes: 0 %
Lymphocytes Relative: 19 %
Lymphs Abs: 1.5 10*3/uL (ref 0.7–4.0)
MCH: 29.9 pg (ref 26.0–34.0)
MCHC: 32.8 g/dL (ref 30.0–36.0)
MCV: 91.1 fL (ref 80.0–100.0)
Monocytes Absolute: 0.6 10*3/uL (ref 0.1–1.0)
Monocytes Relative: 8 %
Neutro Abs: 5.5 10*3/uL (ref 1.7–7.7)
Neutrophils Relative %: 71 %
Platelets: 279 10*3/uL (ref 150–400)
RBC: 3.61 MIL/uL — ABNORMAL LOW (ref 3.87–5.11)
RDW: 13.6 % (ref 11.5–15.5)
WBC: 7.7 10*3/uL (ref 4.0–10.5)
nRBC: 0 % (ref 0.0–0.2)

## 2023-01-20 LAB — TSH: TSH: 1.318 u[IU]/mL (ref 0.350–4.500)

## 2023-01-20 MED ORDER — FULVESTRANT 250 MG/5ML IM SOSY
500.0000 mg | PREFILLED_SYRINGE | INTRAMUSCULAR | Status: DC
  Filled 2023-01-20: qty 10

## 2023-01-20 MED ORDER — TRASTUZUMAB-ANNS CHEMO 150 MG IV SOLR
6.0000 mg/kg | Freq: Once | INTRAVENOUS | Status: AC
  Administered 2023-01-20: 462 mg via INTRAVENOUS
  Filled 2023-01-20: qty 22

## 2023-01-20 MED ORDER — SODIUM CHLORIDE 0.9 % IV SOLN
420.0000 mg | Freq: Once | INTRAVENOUS | Status: AC
  Administered 2023-01-20: 420 mg via INTRAVENOUS
  Filled 2023-01-20: qty 14

## 2023-01-20 MED ORDER — FULVESTRANT 250 MG/5ML IM SOSY
500.0000 mg | PREFILLED_SYRINGE | Freq: Once | INTRAMUSCULAR | Status: AC
  Administered 2023-01-20: 500 mg via INTRAMUSCULAR
  Filled 2023-01-20: qty 10

## 2023-01-20 MED ORDER — ACETAMINOPHEN 325 MG PO TABS
650.0000 mg | ORAL_TABLET | Freq: Once | ORAL | Status: DC
  Filled 2023-01-20: qty 2

## 2023-01-20 MED ORDER — DIPHENHYDRAMINE HCL 25 MG PO CAPS
50.0000 mg | ORAL_CAPSULE | Freq: Once | ORAL | Status: DC
  Filled 2023-01-20: qty 2

## 2023-01-20 MED ORDER — SODIUM CHLORIDE 0.9 % IV SOLN
Freq: Once | INTRAVENOUS | Status: AC
  Filled 2023-01-20: qty 250

## 2023-01-20 MED ORDER — HEPARIN SOD (PORK) LOCK FLUSH 100 UNIT/ML IV SOLN
500.0000 [IU] | Freq: Once | INTRAVENOUS | Status: AC | PRN
  Administered 2023-01-20: 500 [IU]
  Filled 2023-01-20: qty 5

## 2023-01-20 NOTE — Assessment & Plan Note (Signed)
continue Lyrica and nortriptyline symptoms stable

## 2023-01-20 NOTE — Assessment & Plan Note (Signed)
Check shouder X ray Possible adhesive capsulitis. Recommend patient to discuss with PCP and rheumatology for further management.

## 2023-01-20 NOTE — Assessment & Plan Note (Signed)
Left lobe nodules are small no need for follow-up.   Right lobe nodule 1.1 cm,stable on  thyroid ultrasound annual imaging surveillance until 5 year stability is confirmed.

## 2023-01-20 NOTE — Assessment & Plan Note (Signed)
She does not tolerate oral potassium.  Potassium is stable.

## 2023-01-20 NOTE — Assessment & Plan Note (Signed)
status post radiation. Continue Zometa monthly, hold if Calcium is <8.6 Continue calcium supplementation. Continue oxycodone as needed.

## 2023-01-20 NOTE — Assessment & Plan Note (Signed)
Antibody treatment plan as list above.

## 2023-01-20 NOTE — Progress Notes (Signed)
Hematology/Oncology Progress note [phone] [phone]    REASON FOR VISIT Follow up for  treatment of breast cancer   ASSESSMENT & PLAN:   Cancer Staging  Breast cancer of upper-outer quadrant of right female breast ([place]) Staging form: Breast, AJCC 8th Edition - Clinical: G2, ER+, PR+, HER2- - Signed by Earlie Server, MD on 04/05/2018 - Pathologic stage from 04/04/2018: No Stage Recommended (ypT3, pN2, cM0, G3, ER+, PR-, HER2-) - Signed by Earlie Server, MD on 04/04/2018 - Pathologic: Stage IV (rpTX, pNX, cM1, ER+, PR-, HER2+) - Signed by Earlie Server, MD on 12/10/2020   Breast cancer of upper-outer quadrant of right female breast ([place]) Recurrent breast cancer with isolated bone metastasis, stage IV #Initially stage IIIA right breast cancer, ER positive, HER-2 negative status post bilateral oophorectomy,Right mastectomy and right axillary lymph node dissection, status post implant, and implant removal.  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 ER positive, HER-2 positive  .- s/p Palliative radiation to T7  Labs reviewed and discussed Proceed with Kanjinti Perjecta.   Continue fulvestrant monthly.  restaging PET- stable  Encounter for monoclonal antibody treatment for malignancy Antibody treatment plan as list above.  Hypocalcemia Continue calcium supplementation 661m TID.  Check Vitamin D level  Hypokalemia She does not tolerate oral potassium.  Potassium is stable.  Malignant neoplasm metastatic to bone ([facility] status post radiation. Continue Zometa monthly, hold if Calcium is <8.6 Continue calcium supplementation. Continue oxycodone as needed.    Neuropathy due to chemotherapeutic drug (HCC) continue Lyrica and nortriptyline symptoms stable  Nonischemic cardiomyopathy (HCC)  Chronic tachycardia Stable low-normal LVEF 50-55%. Follow up with cardiology. She gets ECHO Q3  months.  [place] up with  rheumatology.  Patient is on methotrexate,  plaquenil.   Thyroid nodule Left lobe nodules are small no need for follow-up.   Right lobe nodule 1.1 cm,stable on  thyroid ultrasound annual imaging surveillance until 5 year stability is confirmed.  Right shoulder pain Check shouder X ray Possible adhesive capsulitis. Recommend patient to discuss with PCP and rheumatology for further management.     Orders Placed This Encounter  Procedures   DG Shoulder Right    Standing Status:   Future    Number of Occurrences:   1    Standing Expiration Date:   01/21/2024    Order Specific Question:   Reason for Exam (SYMPTOM  OR DIAGNOSIS REQUIRED)    Answer:   right shoulder pain    Order Specific Question:   Is patient pregnant?    Answer:   No    Order Specific Question:   Preferred imaging location?    Answer:   [facility]   Comprehensive metabolic panel    Standing Status:   Future    Standing Expiration Date:   02/10/2024   CBC with Differential    Standing Status:   Future    Standing Expiration Date:   02/10/2024   TSH    Standing Status:   Future    Number of Occurrences:   1    Standing Expiration Date:   01/21/2024   T4    Standing Status:   Future    Number of Occurrences:   1    Standing Expiration Date:   01/21/2024   Follow up  lab [place]3 weeks.  lab MD Kanjinti Perjecta 6 weeks.  fulvestrant/Zometa Q 4 weeks   All questions were answered.  The patient knows to call the clinic with any problems, questions or concerns.  Earlie Server, MD, PhD [facility] Hematology Oncology 01/20/2023       Oncology History  Oncology History Overview Note        Malignant neoplasm of overlapping sites of right breast in female, estrogen receptor positive ([place])  Breast cancer of upper-outer quadrant of right female breast ([place])  10/19/2017 Initial Diagnosis   Malignant neoplasm of overlapping sites of right breast in  female, estrogen receptor positive ([place])   11/08/2017 - 01/03/2018 Chemotherapy   Neoadjuvant ddAC x 4 + 1 cycle of Taxol  due to lack of response, surgery was offered.   03/19/2018 Surgery   S/p right mastectomy and right axillary dissection, immediate breast reconstruction with placement of expanders.  ypT3 ypN2, + lymphovascular invasion,  Grade 3, margin is negative, close. ER 90%, PR 0%, HER2 IHC negative.   # elective bilateral salpingo-oophorectomy..    06/11/2018 Imaging   s/p 11 cycles Taxol adjuvantly   10/10/2018 -  Radiation Therapy   adjuvant right chest wall radiation   06/03/2019 Surgery   underwent elective left prophylactic mastectomy and sentinel lymph node biopsy of left axilla Pathology negative for malignancy  # Mediport removal  # right implant removal on    07/07/2020 Imaging   MRI thoracic spine without contrast showed lesions involving the T7 posterior elements most concerning for metastatic lesion.  No evidence of epidural tumor.  Minimal thoracic spondylosis without stenosis. MRI was reviewed by me and a PET scan was obtained for further evaluation   07/20/2020 Imaging   PET scan showed hypermetabolic metastasis involving the posterior element of T7, no additional evidence of metastasis in the neck, chest, abdomen or pelvis.   07/29/2020 Procedure   T7 lesion biopsy showed metastatic carcinoma, compatible with breast origin.  Receptor status staining showed ER 71-80% positive, PR negative, HER-2 positive IHC 3+   08/31/2020 -  Radiation Therapy    finished spine radiation    09/17/2020 -  Chemotherapy   Patient is on Treatment Plan :  BREAST Trastuzumab + Pertuzumab q21d     12/10/2020 Cancer Staging   Staging form: Breast, AJCC 8th Edition - Pathologic: Stage IV (rpTX, pNX, cM1, ER+, PR-, HER2+) - Signed by Earlie Server, MD on 12/10/2020   03/15/2021 Imaging    PET scan showed no focal hypermetabolic activity to suggest skeletal metastasis.  Mild hypermetabolic  activity along the right T7-8 paraspinal musculature.  Max SUV 3.3.  Likely postprocedural-   09/28/2021 Echocardiogram   further decrease of LVEF to 45%   10/14/2021 Imaging   PET  Post bilateral mastectomy and RIGHT axillary dissection without signs of recurrent or metastatic disease.   11/12/2021 Echocardiogram   LVEF of 45-50%.   02/18/2022 Imaging   MRI thoracic and lumbar spine w wo contrast  1. Negative MRIs of the thoracic and lumbar spine. No evidence for locally recurrent metastasis at the level of T7. No other new metastatic disease elsewhere with thoracolumbar spine. 2. No significant disc pathology, stenosis, or evidence for neural impingement.    02/25/2022 Echocardiogram   LVEF 55-60%.   03/16/2022 Imaging   PET showed Stable PET-CT. No findings for local recurrent breast cancer,locoregional adenopathy or distant metastatic disease.    07/20/2022 Imaging   MRI Thoracic Spine w wo contrast 1. Interval resolution of the increased T2 signal contrast enhancement in the posterior elements of T7. No new lesion identified. 2. No spinal canal or neural  foraminal stenosis.   08/23/2022 Echocardiogram   Stable low-normal LVEF at 50-55%.   10/05/2022 Imaging   PET restaging Stable examination without evidence of hypermetabolic recurrence or metastatic disease.   Persistent minimally metabolic stranding in the posterior bilateral gluteal soft tissues may reflect sequela of subcutaneous injections      INTERVAL HISTORY 42 yo female with above oncology history reviewed by me presents for follow-up of management of metastatic breast cancer.  Chronic back pain, unchanged.   Intermittent diarrhea, manageable.  + hair loss + right shoulder pain, acute onset of symptoms, worse with raising her RUE     Review of Systems  Constitutional:  Negative for chills, fever, malaise/fatigue and weight loss.  HENT:  Negative for sore throat.        Hair loss  Eyes:  Negative for  redness.  Respiratory:  Negative for cough, shortness of breath and wheezing.   Cardiovascular:  Negative for chest pain, palpitations and leg swelling.  Gastrointestinal:  Negative for abdominal pain, blood in stool, heartburn, nausea and vomiting.  Genitourinary:  Negative for dysuria.  Musculoskeletal:  Positive for back pain and joint pain.  Skin:  Negative for rash.  Neurological:  Positive for tingling. Negative for dizziness and tremors.  Endo/Heme/Allergies:  Does not bruise/bleed easily.  Psychiatric/Behavioral:  Negative for hallucinations. The patient does not have insomnia.     No Known Allergies  Patient Active Problem List   Diagnosis Date Noted   Breast cancer of upper-outer quadrant of right female breast ([place]) 03/19/2018    Priority: High   Right shoulder pain 01/20/2023    Priority: Medium    Thyroid nodule 06/04/2022    Priority: Medium    Hypokalemia 06/03/2022    Priority: Medium    Malignant neoplasm metastatic to bone ([place]) 03/25/2021    Priority: Medium    Encounter for monoclonal antibody treatment for malignancy 12/10/2020    Priority: Medium    Hypocalcemia 11/19/2020    Priority: Medium    Neuropathy due to chemotherapeutic drug ([place]) 08/11/2020    Priority: Medium    Rheumatoid arteritis ([place]) 10/13/2019    Priority: Medium    Nonischemic cardiomyopathy ([place]) 08/23/2018    Priority: Medium    Encounter for antineoplastic chemotherapy 10/29/2020    Priority: Low   Goals of care, counseling/discussion 08/11/2020    Priority: Low   Tachycardia, unspecified 04/15/2021   Bone lesion 11/19/2020   Inflammatory arthritis 11/19/2020   HER2-positive carcinoma of breast ([place]) 08/11/2020   Bilateral hand swelling 10/03/2019   Fracture of neck of metacarpal bone 05/14/2019   Chronic fatigue 04/09/2019   Polyarthralgia 04/09/2019   Status post right breast reconstruction 02/26/2019   Status post right mastectomy 02/26/2019   Mastalgia 02/15/2019    Shortness of breath 08/23/2018   Preprocedural cardiovascular examination 08/23/2018   Tachycardia 08/23/2018   Palpitations 08/23/2018   Estrogen receptor positive status (ER+) 04/04/2018   Acquired absence of right breast and nipple 04/03/2018   Family history of cancer    Malignant neoplasm of overlapping sites of right breast in female, estrogen receptor positive ([place]) 10/19/2017   Gastroesophageal reflux disease without esophagitis 02/24/2017   Generalized anxiety disorder 10/03/2014   Headache 10/03/2014     Past Medical History:  Diagnosis Date   Anemia    Arthritis    BRCA negative 11/26/2017   Breast cancer ([place]) 10/11/2017   Multifocal, ER positive, PR negative, HER-2 negative. ypT3 ypN2a 8.7 cm, 4/15 nodes   Cardiomyopathy ([place])  a. 10/2017 Echo: EF 60-65%, no rwma, Gr1 DD, nl RV size/fxn; b. 04/2018 Echo: EF 55-60%, no rwma, Nl RV size/fxn; c. 08/2018 Echo: EF 45%, diff HK, ? HK of antsept wall. Gr1 DD. Mild MR. Mild LAE/RAE. Mod dil RV.    Chronic bronchitis ([place]) 11/2017   COPD (chronic obstructive pulmonary disease) (HCC)    MILD PER CXR   Depression    Family history of cancer    GERD (gastroesophageal reflux disease)    Headache    MIGRAINES   Heart murmur    ASYMPTOMATIC   Personal history of chemotherapy    current for right breast ca     Past Surgical History:  Procedure Laterality Date   AXILLARY LYMPH NODE DISSECTION Right 03/19/2018   Procedure: AXILLARY LYMPH NODE DISSECTION;  Surgeon: Robert Bellow, MD;  Location: [facility];  Service: General;  Laterality: Right;   BREAST BIOPSY Right 10/11/2017   12:30 posterior coil clip invasive mammary carcinoma   BREAST BIOPSY Right 10/11/2017   11:30 middle depth ribbon clip DCIS   BREAST BIOPSY Right 10/11/2017   5:30 anterior depth x shape invasive ductal carcinoma   BREAST IMPLANT REMOVAL Right 06/03/2019   Procedure: REMOVAL OF RIGHT BREAST IMPLANTS;  Surgeon: Wallace Going, DO;  Location:  [facility];  Service: Plastics;  Laterality: Right;   BREAST RECONSTRUCTION WITH PLACEMENT OF TISSUE EXPANDER AND FLEX HD (ACELLULAR HYDRATED DERMIS) Right 03/19/2018   Procedure: BREAST RECONSTRUCTION WITH PLACEMENT OF TISSUE EXPANDER AND FLEX HD (ACELLULAR HYDRATED DERMIS);  Surgeon: Wallace Going, DO;  Location: [facility];  Service: Plastics;  Laterality: Right;   CARPAL TUNNEL RELEASE Bilateral 2020   CHOLECYSTECTOMY N/A 04/27/2020   Procedure: LAPAROSCOPIC CHOLECYSTECTOMY WITH INTRAOPERATIVE CHOLANGIOGRAM;  Surgeon: Robert Bellow, MD;  Location: [facility];  Service: General;  Laterality: N/A;   ESOPHAGOGASTRODUODENOSCOPY (EGD) WITH PROPOFOL N/A 04/17/2020   Procedure: ESOPHAGOGASTRODUODENOSCOPY (EGD) WITH PROPOFOL;  Surgeon: Robert Bellow, MD;  Location: [facility];  Service: Endoscopy;  Laterality: N/A;  with biopsy   LAPAROSCOPIC BILATERAL SALPINGO OOPHERECTOMY Bilateral 03/19/2018   Procedure: LAPAROSCOPIC BILATERAL SALPINGO OOPHORECTOMY;  Surgeon: Benjaman Kindler, MD;  Location: [facility];  Service: Gynecology;  Laterality: Bilateral;   MASTECTOMY Right 03/2018   MASTECTOMY W/ SENTINEL NODE BIOPSY Right 03/19/2018   Procedure: MASTECTOMY WITH SENTINEL LYMPH NODE BIOPSY;  Surgeon: Robert Bellow, MD;  Location: [facility];  Service: General;  Laterality: Right;   PORT-A-CATH REMOVAL Left 06/03/2019   Procedure: REMOVAL PORT-A-CATH;  Surgeon: Robert Bellow, MD;  Location: [facility];  Service: General;  Laterality: Left;   PORTACATH PLACEMENT Left 10/24/2017   Procedure: INSERTION PORT-A-CATH;  Surgeon: Robert Bellow, MD;  Location: [facility];  Service: General;  Laterality: Left;   PORTACATH PLACEMENT Right 09/28/2020   Procedure: INSERTION PORT-A-CATH;  Surgeon: Robert Bellow, MD;  Location: [facility];  Service: General;  Laterality: Right;   REMOVAL OF TISSUE EXPANDER AND PLACEMENT OF IMPLANT Right 07/20/2018   Procedure: REMOVAL OF RIGHT BREAST TISSUE EXPANDER AND  PLACEMENT OF IMPLANT;  Surgeon: Wallace Going, DO;  Location: [place];  Service: Plastics;  Laterality: Right;   SIMPLE MASTECTOMY WITH AXILLARY SENTINEL NODE BIOPSY Left 06/03/2019   Procedure: SIMPLE MASTECTOMY LEFT;  Surgeon: Robert Bellow, MD;  Location: [facility];  Service: General;  Laterality: Left;    Social History   Socioeconomic History   Marital status: Married    Spouse name: Not on file  Number of children: Not on file   Years of education: Not on file   Highest education level: Not on file  Occupational History   Occupation: pharmacy tech    Comment: [employer]   Tobacco Use   Smoking status: Every Day    Packs/day: 0.50    Years: 18.00    Total pack years: 9.00    Types: Cigarettes    Start date: 06/21/2018   Smokeless tobacco: Never  Vaping Use   Vaping Use: Never used  Substance and Sexual Activity   Alcohol use: No   Drug use: No   Sexual activity: Yes    Birth control/protection: Injection, Other-see comments    Comment: has had hysterectomy  Other Topics Concern   Not on file  Social History Narrative   Lives at home with husband and daughter   Social Determinants of Health   Financial Resource Strain: Not on file  Food Insecurity: Not on file  Transportation Needs: Not on file  Physical Activity: Not on file  Stress: Not on file  Social Connections: Not on file  Intimate Partner Violence: Not on file     Family History  Problem Relation Age of Onset   Diabetes Father    Hypertension Father    Hyperlipidemia Father    Heart attack Father 50       "mild"   Melanoma Maternal Aunt        other aunts with BCC/SCC/Melanoma   Cervical cancer Maternal Aunt 24       daughter w/ cervical cancer as well   Lung cancer Maternal Aunt    Melanoma Maternal Uncle        other uncles with BCC/SCC/Melanoma   Breast cancer Paternal Aunt    Bladder Cancer Maternal Grandmother   Biological mother  had Grave's disease.    Current Outpatient Medications:    acetaminophen (TYLENOL) 500 MG tablet, Take 500 mg by mouth every 6 (six) hours as needed., Disp: , Rfl:    albuterol (VENTOLIN HFA) 108 (90 Base) MCG/ACT inhaler, Inhale 2 puffs into the lungs every 6 (six) hours as needed for wheezing or shortness of breath. , Disp: , Rfl:    CALCIUM CARBONATE-VITAMIN D PO, Take 600-800 mg by mouth in the morning, at noon, and at bedtime., Disp: , Rfl:    chlorhexidine (PERIDEX) 0.12 % solution, Use as directed 15 mLs in the mouth or throat 2 (two) times daily., Disp: 120 mL, Rfl: 0   cyanocobalamin (,VITAMIN B-12,) 1000 MCG/ML injection, Inject 1,000 mcg into the skin every 30 (thirty) days., Disp: , Rfl:    cyclobenzaprine (FLEXERIL) 5 MG tablet, Take 5 mg by mouth 3 (three) times daily as needed for muscle spasms., Disp: , Rfl:    diphenoxylate-atropine (LOMOTIL) 2.5-0.025 MG tablet, Take 1 tablet by mouth 4 (four) times daily as needed for diarrhea or loose stools., Disp: 60 tablet, Rfl: 0   escitalopram (LEXAPRO) 20 MG tablet, Take 20 mg by mouth daily., Disp: , Rfl:    esomeprazole (NEXIUM) 40 MG capsule, Take 40 mg by mouth daily before breakfast. , Disp: , Rfl:    Eszopiclone 3 MG TABS, Take 3 mg by mouth at bedtime as needed (sleep). , Disp: , Rfl:    folic acid (FOLVITE) 1 MG tablet, Take 1 mg by mouth daily., Disp: , Rfl:    hydroxychloroquine (PLAQUENIL) 200 MG tablet, Take 200 mg by mouth daily., Disp: , Rfl:    ibuprofen (ADVIL)  800 MG tablet, Take 800 mg by mouth every 8 (eight) hours as needed for moderate pain., Disp: , Rfl:    loperamide (IMODIUM) 2 MG capsule, Take 1 tablet (2 mg total) by mouth See admin instructions. Take 2 tablets with onset of diarrhea, then take 1 tablet every 2 hours until diarrhea stops. Maximum 8 tablets in 24, Disp: 90 capsule, Rfl: 0   loratadine (CLARITIN) 10 MG tablet, Take 10 mg by mouth daily. , Disp: , Rfl:    LORazepam (ATIVAN) 1 MG tablet, Take 1 mg by  mouth 3 (three) times daily., Disp: , Rfl:    Magnesium 500 MG TABS, Take 500 mg by mouth 2 (two) times daily., Disp: , Rfl:    methocarbamol (ROBAXIN) 500 MG tablet, Take by mouth., Disp: , Rfl:    methotrexate (RHEUMATREX) 2.5 MG tablet, Take 25 mg by mouth every Sunday. 10 tablets once a week, Disp: , Rfl:    mupirocin ointment (BACTROBAN) 2 %, Place 1 application into the nose 2 (two) times daily. Use in each nostril twice daily for five (5) days., Disp: 22 g, Rfl: 5   nortriptyline (PAMELOR) 10 MG capsule, Take 30 mg by mouth at bedtime. , Disp: , Rfl:    oxyCODONE (OXY IR/ROXICODONE) 5 MG immediate release tablet, Take 1 tablet (5 mg total) by mouth every 6 (six) hours as needed for moderate pain or severe pain., Disp: 90 tablet, Rfl: 0   phentermine (ADIPEX-P) 37.5 MG tablet, Take 37.5 mg by mouth daily before breakfast. , Disp: , Rfl:    predniSONE (DELTASONE) 5 MG tablet, Take 5 mg by mouth daily with breakfast., Disp: , Rfl:    pregabalin (LYRICA) 150 MG capsule, Take 150 mg by mouth 2 (two) times daily., Disp: , Rfl:    promethazine (PHENERGAN) 25 MG tablet, Take 1 tablet (25 mg total) by mouth every 8 (eight) hours as needed for nausea or vomiting., Disp: 90 tablet, Rfl: 0   pyridOXINE (VITAMIN B-6) 100 MG tablet, Take 100 mg by mouth daily., Disp: , Rfl:    topiramate (TOPAMAX) 50 MG tablet, Take 50 mg by mouth 2 (two) times daily. , Disp: , Rfl:    vitamin C (ASCORBIC ACID) 500 MG tablet, Take 500 mg by mouth daily., Disp: , Rfl:  No current facility-administered medications for this visit.  Facility-Administered Medications Ordered in Other Visits:    heparin lock flush 100 unit/mL, 500 Units, Intravenous, Once, Earlie Server, MD   sodium chloride flush (NS) 0.9 % injection 10 mL, 10 mL, Intravenous, PRN, Earlie Server, MD, 10 mL at 11/19/18 1249   sodium chloride flush (NS) 0.9 % injection 10 mL, 10 mL, Intravenous, Once, Earlie Server, MD   Physical exam:  Vitals:   01/20/23 1019  BP:  123/75  Pulse: (!) 110  Resp: 16  Temp: 97.9 F (36.6 C)  TempSrc: Tympanic  SpO2: 100%  Weight: 184 lb (83.5 kg)  ECOG 1 Physical Exam Constitutional:      General: She is not in acute distress.    Appearance: She is not diaphoretic.  HENT:     Head: Normocephalic and atraumatic.     Nose: Nose normal.  Eyes:     General: No scleral icterus. Neck:     Vascular: No JVD.  Cardiovascular:     Rate and Rhythm: Normal rate.  Pulmonary:     Effort: Pulmonary effort is normal. No respiratory distress.     Breath sounds: Normal breath sounds. No wheezing.  Abdominal:     General: There is no distension.     Palpations: Abdomen is soft.  Musculoskeletal:     Cervical back: Normal range of motion.     Comments: Limited right shoulder range of moton due to pain  Lymphadenopathy:     Cervical: No cervical adenopathy.  Skin:    General: Skin is warm.     Findings: No erythema or rash.  Neurological:     Mental Status: She is alert and oriented to person, place, and time. Mental status is at baseline.     Cranial Nerves: No cranial nerve deficit.     Motor: No abnormal muscle tone.     Coordination: Coordination normal.  Psychiatric:        Mood and Affect: Mood and affect normal.        Labs     Latest Ref Rng & Units 01/20/2023    9:49 AM  CMP  Glucose 70 - 99 mg/dL 114   BUN 6 - 20 mg/dL 22   Creatinine 0.44 - 1.00 mg/dL 0.97   Sodium 135 - 145 mmol/L 137   Potassium 3.5 - 5.1 mmol/L 3.5   Chloride 98 - 111 mmol/L 105   CO2 22 - 32 mmol/L 23   Calcium 8.9 - 10.3 mg/dL 8.3   Total Protein 6.5 - 8.1 g/dL 7.4   Total Bilirubin 0.3 - 1.2 mg/dL 0.2   Alkaline Phos 38 - 126 U/L 64   AST 15 - 41 U/L 29   ALT 0 - 44 U/L 21       Latest Ref Rng & Units 01/20/2023    9:49 AM  CBC  WBC 4.0 - 10.5 K/uL 7.7   Hemoglobin 12.0 - 15.0 g/dL 10.8   Hematocrit 36.0 - 46.0 % 32.9   Platelets 150 - 400 K/uL 279    RADIOGRAPHIC STUDIES: I have personally reviewed the  radiological images as listed and agreed with the findings in the report. US THYROID  Result Date: 12/14/2022 CLINICAL DATA:  Prior ultrasound follow-up. Right-sided 1.1 cm TI-RADS category 4 nodule currently under imaging surveillance EXAM: THYROID ULTRASOUND TECHNIQUE: Ultrasound examination of the thyroid gland and adjacent soft tissues was performed. COMPARISON:  Prior thyroid ultrasound 08/09/2021 FINDINGS: Parenchymal Echotexture: Normal Isthmus: 0.4 cm Right lobe: 6.5 x 1.5 x 2.4 cm Left lobe: 6.1 x 1.6 x 1.9 cm _________________________________________________________ Estimated total number of nodules >/= 1 cm: 1 Number of spongiform nodules >/=  2 cm not described below (TR1): 0 Number of mixed cystic and solid nodules >/= 1.5 cm not described below ([place]): 0 _________________________________________________________ Nodule # 1: Prior biopsy: No Location: Right; Superior Maximum size: 1.2 cm; Other 2 dimensions: 0.8 x 0.5 cm, previously, 1.1 x 0.8 x 0.5 cm Composition: solid/almost completely solid (2) Echogenicity: hypoechoic (2) Shape: not taller-than-wide (0) Margins: smooth (0) Echogenic foci: none (0) ACR TI-RADS total points: 4. ACR TI-RADS risk category:  TR4 (4-6 points). Significant change in size (>/= 20% in two dimensions and minimal increase of 2 mm): No Change in features: No Change in ACR TI-RADS risk category: No ACR TI-RADS recommendations: *Given size (>/= 1 - 1.4 cm) and appearance, a follow-up ultrasound in 1 year should be considered based on TI-RADS criteria. _________________________________________________________ Additional small subcentimeter nodules present bilaterally. None meet criteria to warrant further evaluation. IMPRESSION: 1. Confirmed 1 year stability of TI-RADS category 4 nodule in the right superior gland. Recommend continued annual imaging surveillance until 5 year stability is confirmed.  2. No new nodules. The above is in keeping with the ACR TI-RADS recommendations -  J Am Coll Radiol 2017;14:587-595. Electronically Signed   By: Jacqulynn Cadet M.D.   On: 12/14/2022 06:31   ECHOCARDIOGRAM LIMITED  Result Date: 11/25/2022    ECHOCARDIOGRAM LIMITED REPORT   Patient Name:   CYLEIGH CIONI Date of Exam: 11/25/2022 Medical Rec #:  [id number]      Height:       67.0 in Accession #:    [id number]     Weight:       175.3 lb Date of Birth:  [birth year]      BSA:          1.912 m Patient Age:    7 years       BP:           121/74 mmHg Patient Gender: F              HR:           92 bpm. Exam Location:  [place] Procedure: Limited Echo, Limited Color Doppler and Intracardiac Opacification            Agent Indications:    I42.80 Non-ischemic cardiomyopathy  History:        Patient has prior history of Echocardiogram examinations, most                 recent 08/23/2022. Cardiomyopathy, COPD, Arrythmias:Tachycardia;                 Signs/Symptoms:Shortness of Breath and Murmur. Currently                 receiving chemotherapy.  Sonographer:    Pilar Jarvis RDMS, RVT, RDCS Referring Phys: [id number] [address]  Sonographer Comments: Markedly suboptimal tracking on GLS and DHM images IMPRESSIONS  1. Left ventricular ejection fraction, by estimation, is 50 to 55%. The left ventricle has low normal function. The left ventricle has no regional wall motion abnormalities. Left ventricular diastolic parameters are indeterminate. The average left ventricular global longitudinal strain is -10.5 %. The global longitudinal strain is abnormal.  2. Right ventricular systolic function is normal. The right ventricular size is normal.  3. The mitral valve is normal in structure. No evidence of mitral valve regurgitation. No evidence of mitral stenosis.  4. The aortic valve was not well visualized. Aortic valve regurgitation is not visualized. No aortic stenosis is present.  5. The inferior vena cava is normal in size with greater than 50% respiratory variability, suggesting right atrial pressure of 3  mmHg. FINDINGS  Left Ventricle: Left ventricular ejection fraction, by estimation, is 50 to 55%. The left ventricle has low normal function. The left ventricle has no regional wall motion abnormalities. The average left ventricular global longitudinal strain is -10.5 %. The global longitudinal strain is abnormal. The left ventricular internal cavity size was normal in size. There is no left ventricular hypertrophy. Left ventricular diastolic parameters are indeterminate. Right Ventricle: The right ventricular size is normal. No increase in right ventricular wall thickness. Right ventricular systolic function is normal. Left Atrium: Left atrial size was normal in size. Right Atrium: Right atrial size was normal in size. Pericardium: There is no evidence of pericardial effusion. Mitral Valve: The mitral valve is normal in structure. No evidence of mitral valve stenosis. Tricuspid Valve: The tricuspid valve is normal in structure. Tricuspid valve regurgitation is not demonstrated. No evidence of tricuspid stenosis. Aortic Valve: The aortic valve was not well  visualized. Aortic valve regurgitation is not visualized. No aortic stenosis is present. Pulmonic Valve: The pulmonic valve was normal in structure. Pulmonic valve regurgitation is not visualized. No evidence of pulmonic stenosis. Aorta: The aortic root is normal in size and structure. Venous: The inferior vena cava is normal in size with greater than 50% respiratory variability, suggesting right atrial pressure of 3 mmHg. IAS/Shunts: No atrial level shunt detected by color flow Doppler. Additional Comments: Color Doppler performed.  LEFT VENTRICLE PLAX 2D LVIDd:         4.75 cm   Diastology LVIDs:         3.45 cm   LV e' medial:    5.77 cm/s LV PW:         0.60 cm   LV E/e' medial:  13.9 LV IVS:        0.60 cm   LV e' lateral:   15.10 cm/s LVOT diam:     1.90 cm   LV E/e' lateral: 5.3 LVOT Area:     2.84 cm                          2D Longitudinal Strain                           2D Strain GLS Avg:     -10.5 % RIGHT VENTRICLE RV S prime:     11.30 cm/s TAPSE (M-mode): 2.3 cm LEFT ATRIUM         Index LA diam:    3.70 cm 1.94 cm/m   AORTA Ao Root diam: 2.70 cm MITRAL VALVE MV Area (PHT): 4.74 cm    SHUNTS MV Decel Time: 160 msec    Systemic Diam: 1.90 cm MV E velocity: 80.10 cm/s Ida Rogue MD Electronically signed by Ida Rogue MD Signature Date/Time: 11/25/2022/11:53:32 AM    Final

## 2023-01-20 NOTE — Assessment & Plan Note (Signed)
Chronic tachycardia Stable low-normal LVEF 50-55%. Follow up with cardiology. She gets ECHO Q3 months.

## 2023-01-20 NOTE — Assessment & Plan Note (Signed)
Continue calcium supplementation 655m TID.  Check Vitamin D level

## 2023-01-20 NOTE — Patient Instructions (Signed)
[facility]  Discharge Instructions: Thank you for choosing [facility] to provide your oncology and hematology care.  If you have a lab appointment with the [facility], please go directly to the [facility] and check in at the registration area.  Wear comfortable clothing and clothing appropriate for easy access to any Portacath or PICC line.   We strive to give you quality time with your provider. You may need to reschedule your appointment if you arrive late (15 or more minutes).  Arriving late affects you and other patients whose appointments are after yours.  Also, if you miss three or more appointments without notifying the office, you may be dismissed from the clinic at the provider's discretion.      For prescription refill requests, have your pharmacy contact our office and allow 72 hours for refills to be completed.    Today you received the following chemotherapy and/or immunotherapy agents Kanjinti, Perjeta & Faslodex      To help prevent nausea and vomiting after your treatment, we encourage you to take your nausea medication as directed.  BELOW ARE SYMPTOMS THAT SHOULD BE REPORTED IMMEDIATELY: *FEVER GREATER THAN 100.4 F (38 C) OR HIGHER *CHILLS OR SWEATING *NAUSEA AND VOMITING THAT IS NOT CONTROLLED WITH YOUR NAUSEA MEDICATION *UNUSUAL SHORTNESS OF BREATH *UNUSUAL BRUISING OR BLEEDING *URINARY PROBLEMS (pain or burning when urinating, or frequent urination) *BOWEL PROBLEMS (unusual diarrhea, constipation, pain near the anus) TENDERNESS IN MOUTH AND THROAT WITH OR WITHOUT PRESENCE OF ULCERS (sore throat, sores in mouth, or a toothache) UNUSUAL RASH, SWELLING OR PAIN  UNUSUAL VAGINAL DISCHARGE OR ITCHING   Items with * indicate a potential emergency and should be followed up as soon as possible or go to the Emergency Department if any problems should occur.  Please show the CHEMOTHERAPY ALERT CARD or IMMUNOTHERAPY ALERT  CARD at check-in to the Emergency Department and triage nurse.  Should you have questions after your visit or need to cancel or reschedule your appointment, please contact [facility]  [phone] and follow the prompts.  Office hours are 8:00 a.m. to 4:30 p.m. Monday - Friday. Please note that voicemails left after 4:00 p.m. may not be returned until the following business day.  We are closed weekends and major holidays. You have access to a nurse at all times for urgent questions. Please call the main number to the clinic [phone] and follow the prompts.  For any non-urgent questions, you may also contact your provider using MyChart. We now offer e-Visits for anyone 42 and older to request care online for non-urgent symptoms. For details visit [email or web address].   Also download the MyChart app! Go to the app store, search "MyChart", open the app, select [facility], and log in with your MyChart username and password.

## 2023-01-20 NOTE — Patient Instructions (Addendum)
Right shoulder Xray- Xray is a walk-in, no appointment is needed. You may go to medical mall to have this scan done at your own convenience. Thank you.

## 2023-01-20 NOTE — Assessment & Plan Note (Signed)
Follow up with  rheumatology.  Patient is on methotrexate,  plaquenil.

## 2023-01-20 NOTE — Assessment & Plan Note (Signed)
Recurrent breast cancer with isolated bone metastasis, stage IV #Initially stage IIIA right breast cancer, ER positive, HER-2 negative status post bilateral oophorectomy,Right mastectomy and right axillary lymph node dissection, status post implant, and implant removal.  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 ER positive, HER-2 positive  .- s/p Palliative radiation to T7  Labs reviewed and discussed Proceed with Kanjinti Perjecta.   Continue fulvestrant monthly.  restaging PET- stable 

## 2023-01-21 LAB — MISC LABCORP TEST (SEND OUT): Labcorp test code: 81950

## 2023-01-22 LAB — T4: T4, Total: 7.8 ug/dL (ref 4.5–12.0)

## 2023-02-10 ENCOUNTER — Inpatient Hospital Stay: Payer: Medicare HMO | Attending: Oncology

## 2023-02-10 ENCOUNTER — Inpatient Hospital Stay: Payer: Medicare HMO

## 2023-02-10 ENCOUNTER — Other Ambulatory Visit: Payer: Self-pay | Admitting: Oncology

## 2023-02-10 ENCOUNTER — Inpatient Hospital Stay: Payer: Medicare HMO | Admitting: Oncology

## 2023-02-10 ENCOUNTER — Encounter: Payer: Self-pay | Admitting: Oncology

## 2023-02-10 VITALS — BP 129/61 | HR 109 | Temp 96.4°F | Resp 18

## 2023-02-10 DIAGNOSIS — Z5112 Encounter for antineoplastic immunotherapy: Secondary | ICD-10-CM | POA: Insufficient documentation

## 2023-02-10 DIAGNOSIS — C7951 Secondary malignant neoplasm of bone: Secondary | ICD-10-CM | POA: Insufficient documentation

## 2023-02-10 DIAGNOSIS — Z170 Estrogen receptor positive status [ER+]: Secondary | ICD-10-CM

## 2023-02-10 DIAGNOSIS — C50411 Malignant neoplasm of upper-outer quadrant of right female breast: Secondary | ICD-10-CM | POA: Diagnosis present

## 2023-02-10 DIAGNOSIS — Z79899 Other long term (current) drug therapy: Secondary | ICD-10-CM | POA: Insufficient documentation

## 2023-02-10 DIAGNOSIS — Z5111 Encounter for antineoplastic chemotherapy: Secondary | ICD-10-CM | POA: Diagnosis present

## 2023-02-10 LAB — COMPREHENSIVE METABOLIC PANEL
ALT: 22 U/L (ref 0–44)
AST: 33 U/L (ref 15–41)
Albumin: 4 g/dL (ref 3.5–5.0)
Alkaline Phosphatase: 58 U/L (ref 38–126)
Anion gap: 9 (ref 5–15)
BUN: 23 mg/dL — ABNORMAL HIGH (ref 6–20)
CO2: 23 mmol/L (ref 22–32)
Calcium: 9.2 mg/dL (ref 8.9–10.3)
Chloride: 107 mmol/L (ref 98–111)
Creatinine, Ser: 0.92 mg/dL (ref 0.44–1.00)
GFR, Estimated: >60 mL/min (ref >60)
Glucose, Bld: 113 mg/dL — ABNORMAL HIGH (ref 70–99)
Potassium: 3.5 mmol/L (ref 3.5–5.1)
Sodium: 139 mmol/L (ref 135–145)
Total Bilirubin: 0.2 mg/dL — ABNORMAL LOW (ref 0.3–1.2)
Total Protein: 7.1 g/dL (ref 6.5–8.1)

## 2023-02-10 LAB — CBC WITH DIFFERENTIAL/PLATELET
Abs Immature Granulocytes: 0.01 10*3/uL (ref 0.00–0.07)
Basophils Absolute: 0.1 10*3/uL (ref 0.0–0.1)
Basophils Relative: 1 %
Eosinophils Absolute: 0.1 10*3/uL (ref 0.0–0.5)
Eosinophils Relative: 1 %
HCT: 30.7 % — ABNORMAL LOW (ref 36.0–46.0)
Hemoglobin: 10.1 g/dL — ABNORMAL LOW (ref 12.0–15.0)
Immature Granulocytes: 0 %
Lymphocytes Relative: 22 %
Lymphs Abs: 1.5 10*3/uL (ref 0.7–4.0)
MCH: 29.6 pg (ref 26.0–34.0)
MCHC: 32.9 g/dL (ref 30.0–36.0)
MCV: 90 fL (ref 80.0–100.0)
Monocytes Absolute: 0.8 10*3/uL (ref 0.1–1.0)
Monocytes Relative: 11 %
Neutro Abs: 4.5 10*3/uL (ref 1.7–7.7)
Neutrophils Relative %: 65 %
Platelets: 293 10*3/uL (ref 150–400)
RBC: 3.41 MIL/uL — ABNORMAL LOW (ref 3.87–5.11)
RDW: 13.4 % (ref 11.5–15.5)
WBC: 6.9 10*3/uL (ref 4.0–10.5)
nRBC: 0 % (ref 0.0–0.2)

## 2023-02-10 MED ORDER — DIPHENHYDRAMINE HCL 25 MG PO CAPS: 50.0000 mg | ORAL_CAPSULE | Freq: Once | ORAL | Status: DC

## 2023-02-10 MED ORDER — HEPARIN SOD (PORK) LOCK FLUSH 100 UNIT/ML IV SOLN
500.0000 [IU] | Freq: Once | INTRAVENOUS | Status: AC | PRN
  Administered 2023-02-10: 500 [IU]
  Filled 2023-02-10: qty 5

## 2023-02-10 MED ORDER — ACETAMINOPHEN 325 MG PO TABS: 650.0000 mg | ORAL_TABLET | Freq: Once | ORAL | Status: DC

## 2023-02-10 MED ORDER — SODIUM CHLORIDE 0.9 % IV SOLN
Freq: Once | INTRAVENOUS | Status: AC
  Filled 2023-02-10: qty 250

## 2023-02-10 MED ORDER — TRASTUZUMAB-ANNS CHEMO 150 MG IV SOLR
6.0000 mg/kg | Freq: Once | INTRAVENOUS | Status: AC
  Administered 2023-02-10: 462 mg via INTRAVENOUS
  Filled 2023-02-10: qty 22

## 2023-02-10 MED ORDER — SODIUM CHLORIDE 0.9 % IV SOLN
420.0000 mg | Freq: Once | INTRAVENOUS | Status: AC
  Administered 2023-02-10: 420 mg via INTRAVENOUS
  Filled 2023-02-10: qty 14

## 2023-02-10 NOTE — Assessment & Plan Note (Signed)
status post radiation. Continue Zometa monthly, hold if Calcium is <8.6 Continue calcium supplementation. Continue oxycodone as needed.

## 2023-02-10 NOTE — Assessment & Plan Note (Signed)
Antibody treatment plan as list above.

## 2023-02-10 NOTE — Assessment & Plan Note (Signed)
Recurrent breast cancer with isolated bone metastasis, stage IV #Initially stage IIIA right breast cancer, ER positive, HER-2 negative status post bilateral oophorectomy,Right mastectomy and right axillary lymph node dissection, status post implant, and implant removal.  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 ER positive, HER-2 positive  .- s/p Palliative radiation to T7  Labs reviewed and discussed Proceed with Kanjinti Perjecta.   Continue fulvestrant monthly.

## 2023-02-10 NOTE — Patient Instructions (Signed)
[facility]  Discharge Instructions: Thank you for choosing [facility] to provide your oncology and hematology care.  If you have a lab appointment with the [facility], please go directly to the [facility] and check in at the registration area.  Wear comfortable clothing and clothing appropriate for easy access to any Portacath or PICC line.   We strive to give you quality time with your provider. You may need to reschedule your appointment if you arrive late (15 or more minutes).  Arriving late affects you and other patients whose appointments are after yours.  Also, if you miss three or more appointments without notifying the office, you may be dismissed from the clinic at the provider's discretion.      For prescription refill requests, have your pharmacy contact our office and allow 72 hours for refills to be completed.    Today you received the following chemotherapy and/or immunotherapy agents- trastuzumab, pertuzumab      To help prevent nausea and vomiting after your treatment, we encourage you to take your nausea medication as directed.  BELOW ARE SYMPTOMS THAT SHOULD BE REPORTED IMMEDIATELY: *FEVER GREATER THAN 100.4 F (38 C) OR HIGHER *CHILLS OR SWEATING *NAUSEA AND VOMITING THAT IS NOT CONTROLLED WITH YOUR NAUSEA MEDICATION *UNUSUAL SHORTNESS OF BREATH *UNUSUAL BRUISING OR BLEEDING *URINARY PROBLEMS (pain or burning when urinating, or frequent urination) *BOWEL PROBLEMS (unusual diarrhea, constipation, pain near the anus) TENDERNESS IN MOUTH AND THROAT WITH OR WITHOUT PRESENCE OF ULCERS (sore throat, sores in mouth, or a toothache) UNUSUAL RASH, SWELLING OR PAIN  UNUSUAL VAGINAL DISCHARGE OR ITCHING   Items with * indicate a potential emergency and should be followed up as soon as possible or go to the Emergency Department if any problems should occur.  Please show the CHEMOTHERAPY ALERT CARD or IMMUNOTHERAPY ALERT CARD  at check-in to the Emergency Department and triage nurse.  Should you have questions after your visit or need to cancel or reschedule your appointment, please contact [facility]  [phone] and follow the prompts.  Office hours are 8:00 a.m. to 4:30 p.m. Monday - Friday. Please note that voicemails left after 4:00 p.m. may not be returned until the following business day.  We are closed weekends and major holidays. You have access to a nurse at all times for urgent questions. Please call the main number to the clinic [phone] and follow the prompts.  For any non-urgent questions, you may also contact your provider using MyChart. We now offer e-Visits for anyone 29 and older to request care online for non-urgent symptoms. For details visit [email or web address].   Also download the MyChart app! Go to the app store, search "MyChart", open the app, select [facility], and log in with your MyChart username and password.

## 2023-02-11 ENCOUNTER — Other Ambulatory Visit: Payer: Self-pay

## 2023-02-14 ENCOUNTER — Encounter: Payer: Self-pay | Admitting: Oncology

## 2023-02-14 NOTE — Progress Notes (Signed)
Encounter cancelled  °

## 2023-02-17 ENCOUNTER — Inpatient Hospital Stay: Payer: Medicare HMO

## 2023-02-17 VITALS — BP 116/68 | HR 109 | Temp 97.8°F | Wt 186.9 lb

## 2023-02-17 DIAGNOSIS — Z95828 Presence of other vascular implants and grafts: Secondary | ICD-10-CM

## 2023-02-17 DIAGNOSIS — Z5111 Encounter for antineoplastic chemotherapy: Secondary | ICD-10-CM | POA: Diagnosis not present

## 2023-02-17 DIAGNOSIS — Z170 Estrogen receptor positive status [ER+]: Secondary | ICD-10-CM

## 2023-02-17 LAB — BASIC METABOLIC PANEL WITH GFR
Anion gap: 8 (ref 5–15)
BUN: 17 mg/dL (ref 6–20)
CO2: 24 mmol/L (ref 22–32)
Calcium: 9.3 mg/dL (ref 8.9–10.3)
Chloride: 108 mmol/L (ref 98–111)
Creatinine, Ser: 1.25 mg/dL — ABNORMAL HIGH (ref 0.44–1.00)
GFR, Estimated: 56 mL/min — ABNORMAL LOW (ref >60)
Glucose, Bld: 115 mg/dL — ABNORMAL HIGH (ref 70–99)
Potassium: 3.5 mmol/L (ref 3.5–5.1)
Sodium: 140 mmol/L (ref 135–145)

## 2023-02-17 MED ORDER — SODIUM CHLORIDE 0.9 % IV SOLN
INTRAVENOUS | Status: DC
  Filled 2023-02-17: qty 250

## 2023-02-17 MED ORDER — ZOLEDRONIC ACID 4 MG/100ML IV SOLN
4.0000 mg | Freq: Once | INTRAVENOUS | Status: AC
  Administered 2023-02-17: 4 mg via INTRAVENOUS
  Filled 2023-02-17: qty 100

## 2023-02-17 MED ORDER — FULVESTRANT 250 MG/5ML IM SOSY
500.0000 mg | PREFILLED_SYRINGE | INTRAMUSCULAR | Status: DC
  Administered 2023-02-17: 500 mg via INTRAMUSCULAR
  Filled 2023-02-17: qty 10

## 2023-02-17 MED ORDER — HEPARIN SOD (PORK) LOCK FLUSH 100 UNIT/ML IV SOLN
500.0000 [IU] | Freq: Once | INTRAVENOUS | Status: AC
  Administered 2023-02-17: 500 [IU] via INTRAVENOUS
  Filled 2023-02-17: qty 5

## 2023-02-17 NOTE — Patient Instructions (Signed)
Zoledronic Acid Injection (Cancer) What is this medication? ZOLEDRONIC ACID (ZOE le dron ik AS id) treats high calcium levels in the blood caused by cancer. It may also be used with chemotherapy to treat weakened bones caused by cancer. It works by slowing down the release of calcium from bones. This lowers calcium levels in your blood. It also makes your bones stronger and less likely to break (fracture). It belongs to a group of medications called bisphosphonates. This medicine may be used for other purposes; ask your health care provider or pharmacist if you have questions. COMMON BRAND NAME(S): Zometa, Zometa Powder What should I tell my care team before I take this medication? They need to know if you have any of these conditions: Dehydration Dental disease Kidney disease Liver disease Low levels of calcium in the blood Lung or breathing disease, such as asthma Receiving steroids, such as dexamethasone or prednisone An unusual or allergic reaction to zoledronic acid, other medications, foods, dyes, or preservatives Pregnant or trying to get pregnant Breast-feeding How should I use this medication? This medication is injected into a vein. It is given by your care team in a hospital or clinic setting. Talk to your care team about the use of this medication in children. Special care may be needed. Overdosage: If you think you have taken too much of this medicine contact a poison control center or emergency room at once. NOTE: This medicine is only for you. Do not share this medicine with others. What if I miss a dose? Keep appointments for follow-up doses. It is important not to miss your dose. Call your care team if you are unable to keep an appointment. What may interact with this medication? Certain antibiotics given by injection Diuretics, such as bumetanide, furosemide NSAIDs, medications for pain and inflammation, such as ibuprofen or naproxen Teriparatide Thalidomide This list  may not describe all possible interactions. Give your health care provider a list of all the medicines, herbs, non-prescription drugs, or dietary supplements you use. Also tell them if you smoke, drink alcohol, or use illegal drugs. Some items may interact with your medicine. What should I watch for while using this medication? Visit your care team for regular checks on your progress. It may be some time before you see the benefit from this medication. Some people who take this medication have severe bone, joint, or muscle pain. This medication may also increase your risk for jaw problems or a broken thigh bone. Tell your care team right away if you have severe pain in your jaw, bones, joints, or muscles. Tell you care team if you have any pain that does not go away or that gets worse. Tell your dentist and dental surgeon that you are taking this medication. You should not have major dental surgery while on this medication. See your dentist to have a dental exam and fix any dental problems before starting this medication. Take good care of your teeth while on this medication. Make sure you see your dentist for regular follow-up appointments. You should make sure you get enough calcium and vitamin D while you are taking this medication. Discuss the foods you eat and the vitamins you take with your care team. Check with your care team if you have severe diarrhea, nausea, and vomiting, or if you sweat a lot. The loss of too much body fluid may make it dangerous for you to take this medication. You may need bloodwork while taking this medication. Talk to your care team if   you wish to become pregnant or think you might be pregnant. This medication can cause serious birth defects. What side effects may I notice from receiving this medication? Side effects that you should report to your care team as soon as possible: Allergic reactions--skin rash, itching, hives, swelling of the face, lips, tongue, or  throat Kidney injury--decrease in the amount of urine, swelling of the ankles, hands, or feet Low calcium level--muscle pain or cramps, confusion, tingling, or numbness in the hands or feet Osteonecrosis of the jaw--pain, swelling, or redness in the mouth, numbness of the jaw, poor healing after dental work, unusual discharge from the mouth, visible bones in the mouth Severe bone, joint, or muscle pain Side effects that usually do not require medical attention (report to your care team if they continue or are bothersome): Constipation Fatigue Fever Loss of appetite Nausea Stomach pain This list may not describe all possible side effects. Call your doctor for medical advice about side effects. You may report side effects to FDA at 1-800-FDA-1088. Where should I keep my medication? This medication is given in a hospital or clinic. It will not be stored at home. NOTE: This sheet is a summary. It may not cover all possible information. If you have questions about this medicine, talk to your doctor, pharmacist, or health care provider. Fulvestrant Injection What is this medication? FULVESTRANT (ful VES trant) treats breast cancer. It works by blocking the hormone estrogen in breast tissue, which prevents breast cancer cells from spreading or growing. This medicine may be used for other purposes; ask your health care provider or pharmacist if you have questions. COMMON BRAND NAME(S): FASLODEX What should I tell my care team before I take this medication? They need to know if you have any of these conditions: Bleeding disorder Liver disease Low blood cell levels, such as low white cells, red cells, and platelets An unusual or allergic reaction to fulvestrant, other medications, foods, dyes, or preservatives Pregnant or trying to get pregnant Breast-feeding How should I use this medication? This medication is injected into a muscle. It is given by your care team in a hospital or clinic  setting. Talk to your care team about the use of this medication in children. Special care may be needed. Overdosage: If you think you have taken too much of this medicine contact a poison control center or emergency room at once. NOTE: This medicine is only for you. Do not share this medicine with others. What if I miss a dose? Keep appointments for follow-up doses. It is important not to miss your dose. Call your care team if you are unable to keep an appointment. What may interact with this medication? Certain medications that prevent or treat blood clots, such as warfarin, enoxaparin, dalteparin, apixaban, dabigatran, rivaroxaban This list may not describe all possible interactions. Give your health care provider a list of all the medicines, herbs, non-prescription drugs, or dietary supplements you use. Also tell them if you smoke, drink alcohol, or use illegal drugs. Some items may interact with your medicine. What should I watch for while using this medication? Your condition will be monitored carefully while you are receiving this medication. You may need blood work while taking this medication. Talk to your care team if you may be pregnant. Serious birth defects can occur if you take this medication during pregnancy and for 1 year after the last dose. You will need a negative pregnancy test before starting this medication. Contraception is recommended while taking this medication  and for 1 year after the last dose. Your care team can help you find the option that works for you. Do not breastfeed while taking this medication and for 1 year after the last dose. This medication may cause infertility. Talk to your care team if you are concerned about your fertility. What side effects may I notice from receiving this medication? Side effects that you should report to your care team as soon as possible: Allergic reactions or angioedema--skin rash, itching or hives, swelling of the face, eyes, lips,  tongue, arms, or legs, trouble swallowing or breathing Pain, tingling, or numbness in the hands or feet Side effects that usually do not require medical attention (report to your care team if they continue or are bothersome): Bone, joint, or muscle pain Constipation Headache Hot flashes Nausea Pain, redness, or irritation at injection site Unusual weakness or fatigue This list may not describe all possible side effects. Call your doctor for medical advice about side effects. You may report side effects to FDA at 1-800-FDA-1088. Where should I keep my medication? This medication is given in a hospital or clinic. It will not be stored at home. NOTE: This sheet is a summary. It may not cover all possible information. If you have questions about this medicine, talk to your doctor, pharmacist, or health care provider.  2023 Elsevier/Gold Standard (2008-01-19 00:00:00)   2023 Elsevier/Gold Standard (2008-01-19 00:00:00)

## 2023-02-23 NOTE — Progress Notes (Signed)
Cardiology Office Note    Date:  02/24/2023   ID:  KOEY GETHERS, DOB June 16, 1981, MRN [id number]  PCP:  Default, Provider, MD  Cardiologist:  Yvonne Kendall, MD  Electrophysiologist:  None   Chief Complaint: Follow up  History of Present Illness:   Theresa Chaney is a 42 y.o. female with history of recurrent breast cancer with isolated bone metastasis s/p mastectomy and chemoradiation complicated by a mildly reduced LVEF, WCT, paroxysmal SVT, lymphedema, COPD, anemia, migraine disorder, and GERD who presents for follow up of her cardiomyopathy.   She was previously evaluated in 10/2017 for intermittent sinus tachycardia and DOE. At that time echoes in 10/2017 and 04/2018 showed normal LV function and it was ultimately felt her symptoms were secondary to chemotherapy. In 08/2018, she experienced upper extremity swelling with repeat echo at that time showing an EF of 45% with question of anteroseptal hypokinesis and moderate right ventricular enlargement. Upon her primary cardiologist reviewing the echo, it was felt her EF was closer to 50-55%. She was placed on Toprol XL. Lexiscan Myoview in 10/2018 showed a medium defect of moderate severity present in the the basal anterior and mild anterior location, EF 55-65%. This was a very suboptimal study due to breast attenuation artifact as well as extracardiac uptake at the inferior border of the heart. There was reversible anterior wall defect suggestive of ischemia, though it was felt this was likely artifact given the defect did not extend to the apex. She was seen in the office in 11/2018 for follow up and was feeling well outside of intermittent palpitations over the prior couple of weeks with report of heart rates of 150 bpm. Episodes would typically last a few minutes. In this setting, she wore a Zio monitor that showed the predominant rhythm was sinus with an average heart rate of 82 bpm (range 48-175 bpm). Rare PACs and PVCs were noted with a  single atrial run lasting 4 beats. There were no sustained arrhythmias or prolonged pauses. Patient triggered events corresponded to sinus rhythm and artifact. In this setting, her Toprol XL was increased to 25 mg. She underwent echo on 02/19/2019 which showed improvement in her EF to 55-60%, normal diastolic function, and no significant valvular abnormalities. She was seen in the office in 02/2019 for presyncope. Repeat cardiac monitoring at that time showed a predominant rhythm of sinus with an average heart rate of 88 bpm (range 47-181 bpm in sinus), 2 episodes of WCT favored to be NSVT over SVT with aberrancy lasting up to 7 beats, and rare PACs/PVCs. She has continued to undergo periodic echoes for chemotherapy evaluation as outlined below.    She has undergone periodic echocardiograms for chemotherapy monitoring which have demonstrated:      Echo in 10/2017 demonstrated an EF of 60 to 65%, normal wall motion, grade 1 diastolic dysfunction, normal RV systolic function, normal PASP, and no significant valvular abnormalities.   Echo in 04/2018 showed an EF of 55 to 60%, normal wall motion, normal LV diastolic function.   Echo in 08/2018 showed an EF of 45%, diffuse hypokinesis, grade 1 diastolic dysfunction, mild mitral regurgitation, mild biatrial enlargement, and moderately dilated RV.   Echo in 01/2020 demonstrated an EF of 60 to 65%, no regional wall motion abnormalities, normal RV systolic function and ventricular cavity size, and no significant valvular abnormalities.     Echo in 09/2020 continues to show a preserved LV systolic function with an EF of 60 to 65%, grade 1 diastolic  dysfunction, normal RV systolic function and ventricular cavity size, and no significant valvular abnormalities.     Echo in 01/2021 demonstrated an EF of 55-60%, no RWMA, normal RV systolic function and ventricular cavity size, and no significant valvular abnormalities.    Echo in 04/2021 showed an EF of 55 to 60%, no  regional wall motion abnormalities, normal RV systolic function and ventricular cavity size, and no significant valvular abnormalities.   Echo in 08/2021 showed a low normal LV systolic function with an EF of 50 to 55%, normal LV diastolic function parameters, normal RV systolic function and ventricular cavity size, and no significant valvular abnormalities.     Echo from 09/28/2021 demonstrated an EF of 45%, global hypokinesis, normal RV systolic function and ventricular cavity size, and trivial mitral regurgitation.     Following noted drop in LV systolic function, trastuzumab and pertuzumab were held.  She was seen in follow-up in 09/2021 with relative hypotension precluding escalation of GDMT.  She underwent repeat echo in 11/2021 which demonstrated an EF of 45 to 50%, normal RV systolic function and ventricular cavity size, and no significant valvular abnormalities.  Given persistent mild LV dysfunction, Toprol was decreased to 12.5 mg and she was initiated on losartan 12.5 mg.  Subsequent repeat echo on 02/25/2022 demonstrated an improvement in her LV systolic function with an EF of 55 to 60%, no regional wall motion abnormalities, grade 1 diastolic dysfunction, normal RV systolic function and ventricular cavity size, no significant valvular abnormalities, and an estimated right atrial pressure of 3 mmHg.     Echo in 05/2022 showed an EF of 50 to 55%, no regional wall motion abnormalities, normal RV systolic function and ventricular cavity size, no significant valvular abnormalities, and an estimated right atrial pressure of 3 mmHg.   With noted lightheadedness/dizziness was subsequently recommended she hold losartan in 06/2022.   Echo from 08/2022 55 to 55%, no regional wall motion abnormalities, grade 1 diastolic dysfunction, normal RV systolic function and ventricular cavity size, trivial mitral regurgitation, and an estimated right atrial pressure of 3 mmHg.   Most recent PET scan from 09/2022  was stable without evidence of hypermetabolic recurrence or metastatic disease.   Echo from 11/2022 showed an EF of 50-55%, no regional wall motion abnormalities, indeterminate diastolic function, normal RV systolic function and ventricular cavity size, no significant valvular abnormalities, and an estimated right atrial pressure of 3 mmHg.   She was last seen in the office in 11/2022 and was without symptoms of angina or cardiac decompensation.  She noted stable fatigue and positional dizziness with dizziness improving some following discontinuation of losartan.  Given stable cardiomyopathy and in the setting of ongoing fatigue and positional dizziness, we underwent a trial of holding Toprol-XL.  She comes in doing well from a cardiac perspective and is without symptoms of angina or cardiac decompensation.  Following discontinuation of metoprolol, she did note some increase in palpitation burden, however this has improved.  She does continue to note some intermittent palpitations that are generally short-lived.  She also notes some randomly occurring sharp fleeting split-second chest discomfort.  She notes ongoing fatigue.  She has had some intermittent left sided lower abdominal discomfort that occasionally will radiate towards the right side.  Symptoms seem to be worse after eating, though can occur randomly.  Her weight is up 10 pounds today when compared to her visit back in December, though is consistent with historical readings.   Labs independently reviewed: 02/2023 - potassium 3.5, BUN  17, serum creatinine 1.25, albumin 4.0, AST/ALT normal, Hgb 10.1, PLT 293 01/2023 -TSH normal 07/2022 - magnesium 2.1   Past Medical History:  Diagnosis Date   Anemia    Arthritis    BRCA negative 11/26/2017   Breast cancer (HCC) 10/11/2017   Multifocal, ER positive, PR negative, HER-2 negative. ypT3 ypN2a 8.7 cm, 4/15 nodes   Cardiomyopathy (HCC)    a. 10/2017 Echo: EF 60-65%, no rwma, Gr1 DD, nl RV  size/fxn; b. 04/2018 Echo: EF 55-60%, no rwma, Nl RV size/fxn; c. 08/2018 Echo: EF 45%, diff HK, ? HK of antsept wall. Gr1 DD. Mild MR. Mild LAE/RAE. Mod dil RV.    Chronic bronchitis (HCC) 11/2017   COPD (chronic obstructive pulmonary disease) (HCC)    MILD PER CXR   Depression    Family history of cancer    GERD (gastroesophageal reflux disease)    Headache    MIGRAINES   Heart murmur    ASYMPTOMATIC   Personal history of chemotherapy    current for right breast ca    Past Surgical History:  Procedure Laterality Date   AXILLARY LYMPH NODE DISSECTION Right 03/19/2018   Procedure: AXILLARY LYMPH NODE DISSECTION;  Surgeon: Earline Mayotte, MD;  Location: [facility];  Service: General;  Laterality: Right;   BREAST BIOPSY Right 10/11/2017   12:30 posterior coil clip invasive mammary carcinoma   BREAST BIOPSY Right 10/11/2017   11:30 middle depth ribbon clip DCIS   BREAST BIOPSY Right 10/11/2017   5:30 anterior depth x shape invasive ductal carcinoma   BREAST IMPLANT REMOVAL Right 06/03/2019   Procedure: REMOVAL OF RIGHT BREAST IMPLANTS;  Surgeon: Peggye Form, DO;  Location: [facility];  Service: Plastics;  Laterality: Right;   BREAST RECONSTRUCTION WITH PLACEMENT OF TISSUE EXPANDER AND FLEX HD (ACELLULAR HYDRATED DERMIS) Right 03/19/2018   Procedure: BREAST RECONSTRUCTION WITH PLACEMENT OF TISSUE EXPANDER AND FLEX HD (ACELLULAR HYDRATED DERMIS);  Surgeon: Peggye Form, DO;  Location: [facility];  Service: Plastics;  Laterality: Right;   CARPAL TUNNEL RELEASE Bilateral 2020   CHOLECYSTECTOMY N/A 04/27/2020   Procedure: LAPAROSCOPIC CHOLECYSTECTOMY WITH INTRAOPERATIVE CHOLANGIOGRAM;  Surgeon: Earline Mayotte, MD;  Location: [facility];  Service: General;  Laterality: N/A;   ESOPHAGOGASTRODUODENOSCOPY (EGD) WITH PROPOFOL N/A 04/17/2020   Procedure: ESOPHAGOGASTRODUODENOSCOPY (EGD) WITH PROPOFOL;  Surgeon: Earline Mayotte, MD;  Location: [facility];  Service: Endoscopy;   Laterality: N/A;  with biopsy   LAPAROSCOPIC BILATERAL SALPINGO OOPHERECTOMY Bilateral 03/19/2018   Procedure: LAPAROSCOPIC BILATERAL SALPINGO OOPHORECTOMY;  Surgeon: Christeen Douglas, MD;  Location: [facility];  Service: Gynecology;  Laterality: Bilateral;   MASTECTOMY Right 03/2018   MASTECTOMY W/ SENTINEL NODE BIOPSY Right 03/19/2018   Procedure: MASTECTOMY WITH SENTINEL LYMPH NODE BIOPSY;  Surgeon: Earline Mayotte, MD;  Location: [facility];  Service: General;  Laterality: Right;   PORT-A-CATH REMOVAL Left 06/03/2019   Procedure: REMOVAL PORT-A-CATH;  Surgeon: Earline Mayotte, MD;  Location: [facility];  Service: General;  Laterality: Left;   PORTACATH PLACEMENT Left 10/24/2017   Procedure: INSERTION PORT-A-CATH;  Surgeon: Earline Mayotte, MD;  Location: [facility];  Service: General;  Laterality: Left;   PORTACATH PLACEMENT Right 09/28/2020   Procedure: INSERTION PORT-A-CATH;  Surgeon: Earline Mayotte, MD;  Location: [facility];  Service: General;  Laterality: Right;   REMOVAL OF TISSUE EXPANDER AND PLACEMENT OF IMPLANT Right 07/20/2018   Procedure: REMOVAL OF RIGHT BREAST TISSUE EXPANDER AND PLACEMENT OF IMPLANT;  Surgeon: Peggye Form, DO;  Location:  [facility];  Service: Plastics;  Laterality: Right;   SIMPLE MASTECTOMY WITH AXILLARY SENTINEL NODE BIOPSY Left 06/03/2019   Procedure: SIMPLE MASTECTOMY LEFT;  Surgeon: Earline MayotteByrnett, Jeffrey W, MD;  Location: [facility];  Service: General;  Laterality: Left;    Current Medications: Current Meds  Medication Sig   acetaminophen (TYLENOL) 500 MG tablet Take 500 mg by mouth every 6 (six) hours as needed.   albuterol (VENTOLIN HFA) 108 (90 Base) MCG/ACT inhaler Inhale 2 puffs into the lungs every 6 (six) hours as needed for wheezing or shortness of breath.    CALCIUM CARBONATE-VITAMIN D PO Take 600-800 mg by mouth in the morning, at noon, and at bedtime.   chlorhexidine (PERIDEX) 0.12 % solution Use as directed 15 mLs in the mouth or  throat 2 (two) times daily.   cyanocobalamin (,VITAMIN B-12,) 1000 MCG/ML injection Inject 1,000 mcg into the skin every 30 (thirty) days.   cyclobenzaprine (FLEXERIL) 5 MG tablet Take 5 mg by mouth 3 (three) times daily as needed for muscle spasms.   diphenoxylate-atropine (LOMOTIL) 2.5-0.025 MG tablet Take 1 tablet by mouth 4 (four) times daily as needed for diarrhea or loose stools.   escitalopram (LEXAPRO) 20 MG tablet Take 20 mg by mouth daily.   esomeprazole (NEXIUM) 40 MG capsule Take 40 mg by mouth daily before breakfast.    Eszopiclone 3 MG TABS Take 3 mg by mouth at bedtime as needed (sleep).    folic acid (FOLVITE) 1 MG tablet Take 1 mg by mouth daily.   hydroxychloroquine (PLAQUENIL) 200 MG tablet Take 200 mg by mouth daily.   ibuprofen (ADVIL) 800 MG tablet Take 800 mg by mouth every 8 (eight) hours as needed for moderate pain.   loperamide (IMODIUM) 2 MG capsule Take 1 tablet (2 mg total) by mouth See admin instructions. Take 2 tablets with onset of diarrhea, then take 1 tablet every 2 hours until diarrhea stops. Maximum 8 tablets in 24   loratadine (CLARITIN) 10 MG tablet Take 10 mg by mouth daily.    LORazepam (ATIVAN) 1 MG tablet Take 1 mg by mouth 3 (three) times daily.   Magnesium 500 MG TABS Take 500 mg by mouth 2 (two) times daily.   methocarbamol (ROBAXIN) 500 MG tablet Take by mouth.   methotrexate (RHEUMATREX) 2.5 MG tablet Take 25 mg by mouth every Sunday. 10 tablets once a week   metoprolol tartrate (LOPRESSOR) 25 MG tablet Take 0.5 tablets (12.5 mg total) by mouth as needed.   mupirocin ointment (BACTROBAN) 2 % Place 1 application into the nose 2 (two) times daily. Use in each nostril twice daily for five (5) days.   nortriptyline (PAMELOR) 10 MG capsule Take 30 mg by mouth at bedtime.    oxyCODONE (OXY IR/ROXICODONE) 5 MG immediate release tablet Take 1 tablet (5 mg total) by mouth every 6 (six) hours as needed for moderate pain or severe pain.   phentermine (ADIPEX-P)  37.5 MG tablet Take 37.5 mg by mouth daily before breakfast.    pregabalin (LYRICA) 150 MG capsule Take 150 mg by mouth 2 (two) times daily.   promethazine (PHENERGAN) 25 MG tablet Take 1 tablet (25 mg total) by mouth every 8 (eight) hours as needed for nausea or vomiting.   pyridOXINE (VITAMIN B-6) 100 MG tablet Take 100 mg by mouth daily.   topiramate (TOPAMAX) 50 MG tablet Take 50 mg by mouth 2 (two) times daily.    vitamin C (ASCORBIC ACID) 500 MG tablet Take  500 mg by mouth daily.    Allergies:   Patient has no known allergies.   Social History   Socioeconomic History   Marital status: Married    Spouse name: Not on file   Number of children: Not on file   Years of education: Not on file   Highest education level: Not on file  Occupational History   Occupation: pharmacy tech    Comment: Acupuncturist [employer]   Tobacco Use   Smoking status: Former    Packs/day: 0.50    Years: 18.00    Additional pack years: 0.00    Total pack years: 9.00    Types: Cigarettes    Start date: 06/21/2018    Quit date: 12/11/2022    Years since quitting: 0.2   Smokeless tobacco: Never  Vaping Use   Vaping Use: Never used  Substance and Sexual Activity   Alcohol use: No   Drug use: No   Sexual activity: Yes    Birth control/protection: Injection, Other-see comments    Comment: has had hysterectomy  Other Topics Concern   Not on file  Social History Narrative   Lives at home with husband and daughter   Social Determinants of Health   Financial Resource Strain: Not on file  Food Insecurity: Not on file  Transportation Needs: Not on file  Physical Activity: Not on file  Stress: Not on file  Social Connections: Not on file     Family History:  The patient's family history includes Bladder Cancer in her maternal grandmother; Breast cancer in her paternal aunt; Cervical cancer (age of onset: 93) in her maternal aunt; Diabetes in her father; Heart attack (age of onset:  28) in her father; Hyperlipidemia in her father; Hypertension in her father; Lung cancer in her maternal aunt; Melanoma in her maternal aunt and maternal uncle.  ROS:   12-point review of systems is negative unless otherwise noted in the HPI.   EKGs/Labs/Other Studies Reviewed:    Studies reviewed were summarized above. The additional studies were reviewed today:  Limited echo 11/25/2022: 1. Left ventricular ejection fraction, by estimation, is 50 to 55%. The  left ventricle has low normal function. The left ventricle has no regional  wall motion abnormalities. Left ventricular diastolic parameters are  indeterminate. The average left  ventricular global longitudinal strain is -10.5 %. The global longitudinal  strain is abnormal.   2. Right ventricular systolic function is normal. The right ventricular  size is normal.   3. The mitral valve is normal in structure. No evidence of mitral valve  regurgitation. No evidence of mitral stenosis.   4. The aortic valve was not well visualized. Aortic valve regurgitation  is not visualized. No aortic stenosis is present.   5. The inferior vena cava is normal in size with greater than 50%  respiratory variability, suggesting right atrial pressure of 3 mmHg.  ___________   Limited echo 08/23/2022: 1. Left ventricular ejection fraction, by estimation, is 50 to 55%. The  left ventricle has low normal function. The left ventricle has no regional  wall motion abnormalities. Left ventricular diastolic parameters are  consistent with Grade I diastolic  dysfunction (impaired relaxation). The average left ventricular global  longitudinal strain is -15.4 %.   2. Right ventricular systolic function is normal. The right ventricular  size is normal.   3. The mitral valve is normal in structure. Trivial mitral valve  regurgitation. No evidence of mitral stenosis.   4. The aortic  valve was not well visualized. Aortic valve regurgitation  is not  visualized. No aortic stenosis is present.   5. The inferior vena cava is normal in size with greater than 50%  respiratory variability, suggesting right atrial pressure of 3 mmHg.  __________   Limited echo 05/17/2022: 1. Left ventricular ejection fraction, by estimation, is 50 to 55%. The  left ventricle has low normal function. The left ventricle has no regional  wall motion abnormalities. The average left ventricular global  longitudinal strain is -11.7 %.   2. Right ventricular systolic function is normal. The right ventricular  size is normal.   3. The mitral valve is normal in structure. No evidence of mitral valve  regurgitation. No evidence of mitral stenosis.   4. The aortic valve is normal in structure. Aortic valve regurgitation is  not visualized. No aortic stenosis is present.   5. The inferior vena cava is normal in size with greater than 50%  respiratory variability, suggesting right atrial pressure of 3 mmHg.   Comparison(s): 02/25/2022-EF 60%-65%.  __________   2D echo 02/25/2022: 1. Left ventricular ejection fraction, by estimation, is 55 to 60%. The  left ventricle has normal function. The left ventricle has no regional  wall motion abnormalities. Left ventricular diastolic parameters are  consistent with Grade I diastolic  dysfunction (impaired relaxation).   2. Right ventricular systolic function is normal. The right ventricular  size is normal.   3. The mitral valve is normal in structure. No evidence of mitral valve  regurgitation. No evidence of mitral stenosis.   4. The aortic valve is normal in structure. Aortic valve regurgitation is  not visualized. No aortic stenosis is present.   5. The inferior vena cava is normal in size with greater than 50%  respiratory variability, suggesting right atrial pressure of 3 mmHg.   Comparison(s): Previous limited echo reported LVEF of 45-50%. __________   Limited echo 11/11/2021: 1. Left ventricular ejection  fraction, by estimation, is 45 to 50%. Left  ventricular ejection fraction by 2D MOD biplane is 46.8 %. The left  ventricle has mildly decreased function.   2. Right ventricular systolic function is normal. The right ventricular  size is normal.   3. The mitral valve is normal in structure. No evidence of mitral valve  regurgitation. __________   2D echo 09/28/2021: 1. Left ventricular ejection fraction, by estimation, is 45%. The left  ventricle has mild to moderately decreased function. The left ventricle  demonstrates global hypokinesis. Left ventricular diastolic parameters  were normal. The average left  ventricular global longitudinal strain is -15.0 %. The global longitudinal  strain is abnormal.   2. Right ventricular systolic function is normal. The right ventricular  size is normal. Tricuspid regurgitation signal is inadequate for assessing  PA pressure.   3. The mitral valve is normal in structure. Trivial mitral valve  regurgitation. No evidence of mitral stenosis.   4. The aortic valve is tricuspid. Aortic valve regurgitation is not  visualized. No aortic stenosis is present.   Comparison(s): A prior study was performed on 08/31/2021. The left  ventricular function is slightly worse.  __________   2D echo 08/31/2021: 1. Left ventricular ejection fraction, by estimation, is 50 to 55%. The  left ventricle has low normal function. Left ventricular endocardial  border not optimally defined to evaluate regional wall motion. Left  ventricular diastolic parameters were  normal. The average left ventricular global longitudinal strain is -13.4  %. The global longitudinal strain is  abnormal.   2. Right ventricular systolic function is normal. The right ventricular  size is normal.   3. The mitral valve is normal in structure. No evidence of mitral valve  regurgitation. No evidence of mitral stenosis.   4. The aortic valve is tricuspid. Aortic valve regurgitation is trivial.   No aortic stenosis is present.  __________   Limited 2D echo 05/05/2021: 1. Left ventricular ejection fraction, by estimation, is 55 to 60%. The  left ventricle has normal function. The left ventricle has no regional  wall motion abnormalities.   2. Right ventricular systolic function is normal. The right ventricular  size is normal.   3. The mitral valve is normal in structure. No evidence of mitral valve  regurgitation.   4. The aortic valve is tricuspid. Aortic valve regurgitation is not  visualized.  __________   Limited 2D echo 01/29/2021:  1. Left ventricular ejection fraction, by estimation, is 55 to 60%. The  left ventricle has normal function. The left ventricle has no regional  wall motion abnormalities. Left ventricular diastolic function could not  be evaluated.   2. Right ventricular systolic function is normal. The right ventricular  size is normal.   3. The mitral valve is normal in structure. Trivial mitral valve  regurgitation.   4. The aortic valve is normal in structure. Aortic valve regurgitation is  not visualized.  __________   Limited 2D echo 09/14/2020: 1. Left ventricular ejection fraction, by estimation, is 60 to 65%. Left  ventricular ejection fraction by PLAX is 68 %. The left ventricle has  normal function. Left ventricular diastolic parameters are consistent with  Grade I diastolic dysfunction  (impaired relaxation).   2. Right ventricular systolic function is normal. The right ventricular  size is normal.   3. The mitral valve was not well visualized. No evidence of mitral valve  regurgitation.   4. Aortic valve regurgitation is not visualized.  __________   2D echo 01/29/2020: 1. Left ventricular ejection fraction, by estimation, is 60 to 65%. The  left ventricle has normal function. The left ventricle has no regional  wall motion abnormalities. Indeterminate diastolic filling due to E-A  fusion.   2. Right ventricular systolic function is  normal. The right ventricular  size is normal.   3. The mitral valve is normal in structure and function. No evidence of  mitral valve regurgitation. No evidence of mitral stenosis.   4. The aortic valve is normal in structure and function. Aortic valve  regurgitation is not visualized. No aortic stenosis is present.  __________   Elwyn Reach patch 02/2019: The patient was monitored for 7 days. The predominant rhythm was sinus with an average rate of 88 bpm (range 47 to 181 bpm in sinus). Rare PAC's and PVC's were noted. Two episodes of wide-complex tachycardia (favor nonsustained ventricular tachycardia over supraventricular tachycardia with aberrancy) occurred, lasting up to 7 beats. Patient triggered events correspond to normal sinus rhythm and narrow complex tachycardia (favor sinus tachycardia).   Predominantly sinus rhythm with two brief episodes of wide-complex tachycardia (favor NSVT over SVT with aberrancy).  Rare PAC's and PVC's also noted.  Patient triggered events correspond to sinus rhythm and narrow complex tachycardia (favor sinus tachycardia but cannot exclude atrial tachycardia). __________   Limited 2D echo 02/19/2019: 1. The left ventricle has normal systolic function, with an ejection  fraction of 55-60%. Left ventricular diastolic parameters were normal.   2. The right ventricle has normal systolc function. The cavity was  normal.  There is no increase in right ventricular wall thickness.   3. The aortic valve was not well visualized. __________   Luci Bank patch 11/2018: The patient was monitored for 13 days, 8 hours. The predominant rhythm was sinus with an average rate of 82 bpm (range 48 to 175 bpm). Rare PACs and PVCs were noted. A single atrial run lasting 4 beats was noted. There was no sustained arrhythmia or prolonged pause. Patient triggered events correspond to sinus rhythm and artifact.   Predominantly sinus rhythm with rare PACs and PVCs.  Brief atrial run lasting 4  beats noted. __________   Eugenie Birks MPI 10/12/2018: T wave inversion was noted during stress in the III, II, aVF, V3, V2, V4, V5 and V6 leads, and returning to baseline after 1-5 mins of recovery. There was no ST segment deviation noted during stress. Defect 1: There is a medium defect of moderate severity present in the basal anterior and mid anterior location. The left ventricular ejection fraction is normal (55-65%). Very suboptimal study due to breast attenuation artifact as well as extracardiac uptake at the inferior border of the heart. There is a reversible anterior wall defect suggestive of ischemia. However, I suspect that this is likely artifact given that the defect does not extend to the apex. Correlate clinically. __________   2D echo 08/15/2018: - Left ventricle: The cavity size was mildly dilated. Systolic    function was mildly reduced. The estimated ejection fraction was    45%. Diffuse hypokinesis. Hypokinesis of the anteroseptal    myocardium. Doppler parameters are consistent with abnormal left    ventricular relaxation (grade 1 diastolic dysfunction).  - Aortic valve: Valve area (VTI): 1.88 cm^2. Valve area (Vmax): 1.8    cm^2. Valve area (Vmean): 1.74 cm^2.  - Mitral valve: There was mild regurgitation. Valve area by    continuity equation (using LVOT flow): 2.22 cm^2.  - Left atrium: The atrium was mildly dilated.  - Right ventricle: The cavity size was moderately dilated.  - Right atrium: The atrium was mildly dilated.   Impressions:   - Mild LV systolic dysfunction with wall motion abnormalities    suggestive of CAD.  __________   2D echo 04/26/2018: - Left ventricle: The cavity size was normal. Wall thickness was    normal. Systolic function was normal. The estimated ejection    fraction was in the range of 55% to 60%. Wall motion was normal;    there were no regional wall motion abnormalities. Left    ventricular diastolic function parameters were normal.   - Pulmonary arteries: Systolic pressure could not be accurately    estimated.  __________   2D echo 10/26/2017: - Left ventricle: The cavity size was normal. Systolic function was    normal. The estimated ejection fraction was in the range of 60%    to 65%. Wall motion was normal; there were no regional wall    motion abnormalities. Doppler parameters are consistent with    abnormal left ventricular relaxation (grade 1 diastolic    dysfunction).  - Left atrium: The atrium was normal in size.  - Right ventricle: Systolic function was normal.  - Pulmonary arteries: Systolic pressure was within the normal    range.    EKG:  EKG is ordered today.  The EKG ordered today demonstrates NSR, 100 bpm, no acute st/t changes  Recent Labs: 07/15/2022: Magnesium 2.1 01/20/2023: TSH 1.318 02/10/2023: ALT 22; Hemoglobin 10.1; Platelets 293 02/17/2023: BUN 17; Creatinine, Ser 1.25; Potassium 3.5; Sodium  140  Recent Lipid Panel No results found for: "CHOL", "TRIG", "HDL", "CHOLHDL", "VLDL", "LDLCALC", "LDLDIRECT"  PHYSICAL EXAM:    VS:  BP 110/70 (BP Location: Left Arm, Patient Position: Sitting, Cuff Size: Normal)   Pulse 100   Ht 5\' 7"  (1.702 m)   Wt 184 lb 8 oz (83.7 kg)   LMP  (LMP Unknown) Comment: Ovaries and tubes removed.April 2019  SpO2 98%   BMI 28.90 kg/m   BMI: Body mass index is 28.9 kg/m.  Physical Exam Vitals reviewed.  Constitutional:      Appearance: She is well-developed.  HENT:     Head: Normocephalic and atraumatic.  Eyes:     General:        Right eye: No discharge.        Left eye: No discharge.  Neck:     Vascular: No JVD.  Cardiovascular:     Rate and Rhythm: Normal rate and regular rhythm.     Heart sounds: Normal heart sounds, S1 normal and S2 normal. Heart sounds not distant. No midsystolic click and no opening snap. No murmur heard.    No friction rub.  Pulmonary:     Effort: Pulmonary effort is normal. No respiratory distress.     Breath sounds: Normal  breath sounds. No decreased breath sounds, wheezing or rales.  Chest:     Chest wall: No tenderness.  Abdominal:     General: There is no distension.  Musculoskeletal:     Cervical back: Normal range of motion.     Right lower leg: No edema.     Left lower leg: No edema.  Skin:    General: Skin is warm and dry.     Nails: There is no clubbing.  Neurological:     Mental Status: She is alert and oriented to person, place, and time.  Psychiatric:        Speech: Speech normal.        Behavior: Behavior normal.        Thought Content: Thought content normal.        Judgment: Judgment normal.     Wt Readings from Last 3 Encounters:  02/24/23 184 lb 8 oz (83.7 kg)  02/17/23 186 lb 15.2 oz (84.8 kg)  02/10/23 187 lb 2.7 oz (84.9 kg)     ASSESSMENT & PLAN:   Chemotherapy-induced systolic dysfunction/NICM: She is euvolemic and well compensated.  Preliminary read on limited echo performed earlier this morning showed a stable low normal LV systolic function at 50 to 55% off beta-blocker.  Given stability of LVEF we will continue to defer rechallenge of ARB or beta-blocker given prior history of fatigue and positional dizziness.  Orthostatics are positive in the office today when comparing supine to sitting BP.  Repeat limited echo in 3 months to ensure stability of LV systolic function off medical therapy.  WCT/paroxysmal SVT/palpitations: Overall, symptoms are stable.  We will provide her with a prescription for Lopressor 12.5 mg twice daily as needed for sustained tachypalpitations.  Recurrent breast cancer with isolated bone metastasis: Management per oncology.    Disposition: F/u with Dr. Okey Dupre or an APP in 3 months.   Medication Adjustments/Labs and Tests Ordered: Current medicines are reviewed at length with the patient today.  Concerns regarding medicines are outlined above. Medication changes, Labs and Tests ordered today are summarized above and listed in the Patient Instructions  accessible in Encounters.   Signed, Eula Listen, PA-C 02/24/2023 9:58 AM     [facility] [address] [place], [place] [address] [phone]

## 2023-02-24 ENCOUNTER — Encounter: Payer: Self-pay | Admitting: Physician Assistant

## 2023-02-24 ENCOUNTER — Ambulatory Visit: Payer: Medicare HMO | Admitting: Physician Assistant

## 2023-02-24 ENCOUNTER — Ambulatory Visit: Payer: Medicare HMO

## 2023-02-24 VITALS — BP 110/70 | HR 100 | Ht 67.0 in | Wt 184.5 lb

## 2023-02-24 DIAGNOSIS — I427 Cardiomyopathy due to drug and external agent: Secondary | ICD-10-CM

## 2023-02-24 DIAGNOSIS — I428 Other cardiomyopathies: Secondary | ICD-10-CM

## 2023-02-24 DIAGNOSIS — T451X5D Adverse effect of antineoplastic and immunosuppressive drugs, subsequent encounter: Secondary | ICD-10-CM

## 2023-02-24 DIAGNOSIS — R000 Tachycardia, unspecified: Secondary | ICD-10-CM

## 2023-02-24 DIAGNOSIS — I471 Supraventricular tachycardia, unspecified: Secondary | ICD-10-CM

## 2023-02-24 DIAGNOSIS — C50411 Malignant neoplasm of upper-outer quadrant of right female breast: Secondary | ICD-10-CM

## 2023-02-24 DIAGNOSIS — T451X5A Adverse effect of antineoplastic and immunosuppressive drugs, initial encounter: Secondary | ICD-10-CM

## 2023-02-24 DIAGNOSIS — Z170 Estrogen receptor positive status [ER+]: Secondary | ICD-10-CM

## 2023-02-24 LAB — ECHOCARDIOGRAM LIMITED
Area-P 1/2: 5.2 cm2
S' Lateral: 3.6 cm

## 2023-02-24 MED ORDER — METOPROLOL TARTRATE 25 MG PO TABS: 12.5000 mg | ORAL_TABLET | ORAL | 0 refills | Status: DC | PRN

## 2023-02-24 NOTE — Patient Instructions (Signed)
Medication Instructions:  Your physician has recommended you make the following change in your medication:  START LOPRESSOR - TAKE HALF TABLET BY MOUTH AS NEEDED FOR PALPITATIONS.   *If you need a refill on your cardiac medications before your next appointment, please call your pharmacy*   Lab Work: NONE   If you have labs (blood work) drawn today and your tests are completely normal, you will receive your results only by: [place] (if you have MyChart) OR A paper copy in the mail If you have any lab test that is abnormal or we need to change your treatment, we will call you to review the results.   Testing/Procedures: Your physician has requested that you have an echocardiogram in 3 months. Echocardiography is a painless test that uses sound waves to create images of your heart. It provides your doctor with information about the size and shape of your heart and how well your heart's chambers and valves are working. This procedure takes approximately one hour. There are no restrictions for this procedure. Please do NOT wear cologne, perfume, aftershave, or lotions (deodorant is allowed). Please arrive 15 minutes prior to your appointment time.    Follow-Up: At [facility], you and your health needs are our priority.  As part of our continuing mission to provide you with exceptional heart care, we have created designated Provider Care Teams.  These Care Teams include your primary Cardiologist (physician) and Advanced Practice Providers (APPs -  Physician Assistants and Nurse Practitioners) who all work together to provide you with the care you need, when you need it.  We recommend signing up for the patient portal called "MyChart".  Sign up information is provided on this After Visit Summary.  MyChart is used to connect with patients for Virtual Visits (Telemedicine).  Patients are able to view lab/test results, encounter notes, upcoming appointments, etc.  Non-urgent messages  can be sent to your provider as well.   To learn more about what you can do with MyChart, go to [email or web address].    Your next appointment:   AFTER ECHOCARDIOGRAM   Provider:   You may see Nelva Bush, MD or one of the following Advanced Practice Providers on your designated Care Team:   Murray Hodgkins, NP Christell Faith, PA-C Cadence Kathlen Mody, PA-C Gerrie Nordmann, NP

## 2023-02-27 ENCOUNTER — Encounter: Payer: Self-pay | Admitting: Oncology

## 2023-03-01 NOTE — Telephone Encounter (Signed)
Called patient  Patient preferred to have ECHO and appt same day due to travel, lives 30 mins away  Patient appreciative

## 2023-03-03 ENCOUNTER — Inpatient Hospital Stay (HOSPITAL_BASED_OUTPATIENT_CLINIC_OR_DEPARTMENT_OTHER): Payer: Medicare HMO | Admitting: Oncology

## 2023-03-03 ENCOUNTER — Inpatient Hospital Stay: Payer: Medicare HMO

## 2023-03-03 ENCOUNTER — Encounter: Payer: Self-pay | Admitting: Oncology

## 2023-03-03 VITALS — BP 111/66 | HR 102 | Temp 97.0°F | Resp 18

## 2023-03-03 VITALS — BP 109/73 | HR 105 | Temp 97.0°F | Resp 18 | Wt 183.2 lb

## 2023-03-03 DIAGNOSIS — Z170 Estrogen receptor positive status [ER+]: Secondary | ICD-10-CM

## 2023-03-03 DIAGNOSIS — Z5112 Encounter for antineoplastic immunotherapy: Secondary | ICD-10-CM | POA: Diagnosis not present

## 2023-03-03 DIAGNOSIS — G8929 Other chronic pain: Secondary | ICD-10-CM

## 2023-03-03 DIAGNOSIS — E876 Hypokalemia: Secondary | ICD-10-CM

## 2023-03-03 DIAGNOSIS — E041 Nontoxic single thyroid nodule: Secondary | ICD-10-CM

## 2023-03-03 DIAGNOSIS — K219 Gastro-esophageal reflux disease without esophagitis: Secondary | ICD-10-CM

## 2023-03-03 DIAGNOSIS — I428 Other cardiomyopathies: Secondary | ICD-10-CM

## 2023-03-03 DIAGNOSIS — C50411 Malignant neoplasm of upper-outer quadrant of right female breast: Secondary | ICD-10-CM | POA: Diagnosis not present

## 2023-03-03 DIAGNOSIS — C7951 Secondary malignant neoplasm of bone: Secondary | ICD-10-CM

## 2023-03-03 DIAGNOSIS — M069 Rheumatoid arthritis, unspecified: Secondary | ICD-10-CM

## 2023-03-03 DIAGNOSIS — G620 Drug-induced polyneuropathy: Secondary | ICD-10-CM

## 2023-03-03 DIAGNOSIS — Z5111 Encounter for antineoplastic chemotherapy: Secondary | ICD-10-CM | POA: Diagnosis not present

## 2023-03-03 DIAGNOSIS — C50811 Malignant neoplasm of overlapping sites of right female breast: Secondary | ICD-10-CM | POA: Diagnosis not present

## 2023-03-03 DIAGNOSIS — M25511 Pain in right shoulder: Secondary | ICD-10-CM

## 2023-03-03 DIAGNOSIS — T451X5A Adverse effect of antineoplastic and immunosuppressive drugs, initial encounter: Secondary | ICD-10-CM

## 2023-03-03 LAB — COMPREHENSIVE METABOLIC PANEL
ALT: 15 U/L (ref 0–44)
AST: 27 U/L (ref 15–41)
Albumin: 4.2 g/dL (ref 3.5–5.0)
Alkaline Phosphatase: 72 U/L (ref 38–126)
Anion gap: 7 (ref 5–15)
BUN: 17 mg/dL (ref 6–20)
CO2: 23 mmol/L (ref 22–32)
Calcium: 8.9 mg/dL (ref 8.9–10.3)
Chloride: 107 mmol/L (ref 98–111)
Creatinine, Ser: 0.86 mg/dL (ref 0.44–1.00)
GFR, Estimated: >60 mL/min (ref >60)
Glucose, Bld: 99 mg/dL (ref 70–99)
Potassium: 3.5 mmol/L (ref 3.5–5.1)
Sodium: 137 mmol/L (ref 135–145)
Total Bilirubin: 0.3 mg/dL (ref 0.3–1.2)
Total Protein: 7.5 g/dL (ref 6.5–8.1)

## 2023-03-03 LAB — CBC WITH DIFFERENTIAL/PLATELET
Abs Immature Granulocytes: 0.02 10*3/uL (ref 0.00–0.07)
Basophils Absolute: 0 10*3/uL (ref 0.0–0.1)
Basophils Relative: 1 %
Eosinophils Absolute: 0.1 10*3/uL (ref 0.0–0.5)
Eosinophils Relative: 2 %
HCT: 33.4 % — ABNORMAL LOW (ref 36.0–46.0)
Hemoglobin: 11.1 g/dL — ABNORMAL LOW (ref 12.0–15.0)
Immature Granulocytes: 0 %
Lymphocytes Relative: 29 %
Lymphs Abs: 1.8 10*3/uL (ref 0.7–4.0)
MCH: 29.3 pg (ref 26.0–34.0)
MCHC: 33.2 g/dL (ref 30.0–36.0)
MCV: 88.1 fL (ref 80.0–100.0)
Monocytes Absolute: 0.6 10*3/uL (ref 0.1–1.0)
Monocytes Relative: 10 %
Neutro Abs: 3.7 10*3/uL (ref 1.7–7.7)
Neutrophils Relative %: 58 %
Platelets: 330 10*3/uL (ref 150–400)
RBC: 3.79 MIL/uL — ABNORMAL LOW (ref 3.87–5.11)
RDW: 13.2 % (ref 11.5–15.5)
WBC: 6.3 10*3/uL (ref 4.0–10.5)
nRBC: 0 % (ref 0.0–0.2)

## 2023-03-03 MED ORDER — HEPARIN SOD (PORK) LOCK FLUSH 100 UNIT/ML IV SOLN
500.0000 [IU] | Freq: Once | INTRAVENOUS | Status: AC | PRN
  Filled 2023-03-03: qty 5

## 2023-03-03 MED ORDER — SODIUM CHLORIDE 0.9 % IV SOLN
420.0000 mg | Freq: Once | INTRAVENOUS | Status: AC
  Administered 2023-03-03: 420 mg via INTRAVENOUS
  Filled 2023-03-03: qty 14

## 2023-03-03 MED ORDER — DIPHENHYDRAMINE HCL 25 MG PO CAPS: 50.0000 mg | ORAL_CAPSULE | Freq: Once | ORAL | Status: DC

## 2023-03-03 MED ORDER — HEPARIN SOD (PORK) LOCK FLUSH 100 UNIT/ML IV SOLN
INTRAVENOUS | Status: AC
  Administered 2023-03-03: 500 [IU]
  Filled 2023-03-03: qty 5

## 2023-03-03 MED ORDER — ACETAMINOPHEN 325 MG PO TABS: 650.0000 mg | ORAL_TABLET | Freq: Once | ORAL | Status: DC

## 2023-03-03 MED ORDER — TRASTUZUMAB-ANNS CHEMO 150 MG IV SOLR
6.0000 mg/kg | Freq: Once | INTRAVENOUS | Status: AC
  Administered 2023-03-03: 462 mg via INTRAVENOUS
  Filled 2023-03-03: qty 22

## 2023-03-03 MED ORDER — SODIUM CHLORIDE 0.9 % IV SOLN
Freq: Once | INTRAVENOUS | Status: AC
  Filled 2023-03-03: qty 250

## 2023-03-03 NOTE — Progress Notes (Signed)
Hematology/Oncology Progress note [phone] [phone]    REASON FOR VISIT Follow up for  treatment of breast cancer   ASSESSMENT & PLAN:   Cancer Staging  Breast cancer of upper-outer quadrant of right female breast ([place]) Staging form: Breast, AJCC 8th Edition - Clinical: G2, ER+, PR+, HER2- - Signed by Earlie Server, MD on 04/05/2018 - Pathologic stage from 04/04/2018: No Stage Recommended (ypT3, pN2, cM0, G3, ER+, PR-, HER2-) - Signed by Earlie Server, MD on 04/04/2018 - Pathologic: Stage IV (rpTX, pNX, cM1, ER+, PR-, HER2+) - Signed by Earlie Server, MD on 12/10/2020   Breast cancer of upper-outer quadrant of right female breast ([place]) Recurrent breast cancer with isolated bone metastasis, stage IV #Initially stage IIIA right breast cancer, ER positive, HER-2 negative status post bilateral oophorectomy,Right mastectomy and right axillary lymph node dissection, status post implant, and implant removal.  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 ER positive, HER-2 positive  .- s/p Palliative radiation to T7  Labs reviewed and discussed Proceed with Kanjinti Perjecta.   Continue fulvestrant monthly. Repeat PET scan in late April 2024.  Encounter for monoclonal antibody treatment for malignancy Antibody treatment plan as list above.  Hypocalcemia Continue calcium supplementation 600mg  TID.  normal Vitamin D level  Hypokalemia She does not tolerate oral potassium.  Potassium is stable.  Malignant neoplasm metastatic to bone [facility]) status post radiation. Continue Zometa monthly, hold if Calcium is <8.6 Continue calcium supplementation. Continue oxycodone as needed.    Neuropathy due to chemotherapeutic drug (HCC) continue Lyrica and nortriptyline symptoms stable  Nonischemic cardiomyopathy (HCC)  Chronic tachycardia Stable low-normal LVEF 50-55%. Follow up with cardiology. She gets  ECHO Q3 months.  Rheumatoid arthritis ([place]) Follow up with  rheumatology.  Patient is on methotrexate,  plaquenil.   Right shoulder pain Check shouder X ray - negative for destructive bone lesions.     Gastroesophageal reflux disease without esophagitis Continue PPI and tums.  She may have had viral gastroenteritis which is getting better. Continue supportive care.  Encourage hydration.  Thyroid nodule Left lobe nodules are small no need for follow-up.   Right lobe nodule 1.1 cm,stable on  thyroid ultrasound - next Due Jan 2025 annual imaging surveillance until 5 year stability is confirmed.   Orders Placed This Encounter  Procedures   NM PET Image Restage (PS) Skull Base to Thigh (F-18 FDG)    Standing Status:   Future    Standing Expiration Date:   03/02/2024    Order Specific Question:   If indicated for the ordered procedure, I authorize the administration of a radiopharmaceutical per Radiology protocol    Answer:   Yes    Order Specific Question:   Preferred imaging location?    Answer:   [facility]    Order Specific Question:   Radiology Contrast Protocol - do NOT remove file path    Answer:   [email or web address]    Order Specific Question:   Is the patient pregnant?    Answer:   No   Comprehensive metabolic panel    Standing Status:   Future    Standing Expiration Date:   04/13/2024   CBC with Differential    Standing Status:   Future    Standing Expiration Date:   04/13/2024   Comprehensive metabolic panel    Standing Status:   Future    Standing Expiration Date:   05/04/2024   CBC with Differential  Standing Status:   Future    Standing Expiration Date:   05/04/2024   Follow up  lab [facility] 3 weeks.  lab MD Kanjinti Perjecta 6 weeks.  fulvestrant/Zometa Q 4 weeks   All questions were answered. The patient knows to call the clinic with any problems, questions or concerns.  Earlie Server, MD, PhD [facility]  Hematology Oncology 03/03/2023       Oncology History  Oncology History Overview Note        Malignant neoplasm of overlapping sites of right breast in female, estrogen receptor positive ([place])  Breast cancer of upper-outer quadrant of right female breast ([place])  10/19/2017 Initial Diagnosis   Malignant neoplasm of overlapping sites of right breast in female, estrogen receptor positive ([place])   11/08/2017 - 01/03/2018 Chemotherapy   Neoadjuvant ddAC x 4 + 1 cycle of Taxol  due to lack of response, surgery was offered.   03/19/2018 Surgery   S/p right mastectomy and right axillary dissection, immediate breast reconstruction with placement of expanders.  ypT3 ypN2, + lymphovascular invasion,  Grade 3, margin is negative, close. ER 90%, PR 0%, HER2 IHC negative.   # elective bilateral salpingo-oophorectomy..    06/11/2018 Imaging   s/p 11 cycles Taxol adjuvantly   10/10/2018 -  Radiation Therapy   adjuvant right chest wall radiation   06/03/2019 Surgery   underwent elective left prophylactic mastectomy and sentinel lymph node biopsy of left axilla Pathology negative for malignancy  # Mediport removal  # right implant removal on    07/07/2020 Imaging   MRI thoracic spine without contrast showed lesions involving the T7 posterior elements most concerning for metastatic lesion.  No evidence of epidural tumor.  Minimal thoracic spondylosis without stenosis. MRI was reviewed by me and a PET scan was obtained for further evaluation   07/20/2020 Imaging   PET scan showed hypermetabolic metastasis involving the posterior element of T7, no additional evidence of metastasis in the neck, chest, abdomen or pelvis.   07/29/2020 Procedure   T7 lesion biopsy showed metastatic carcinoma, compatible with breast origin.  Receptor status staining showed ER 71-80% positive, PR negative, HER-2 positive IHC 3+   08/31/2020 -  Radiation Therapy    finished spine radiation    09/17/2020 -  Chemotherapy    Patient is on Treatment Plan :  BREAST Trastuzumab + Pertuzumab q21d     12/10/2020 Cancer Staging   Staging form: Breast, AJCC 8th Edition - Pathologic: Stage IV (rpTX, pNX, cM1, ER+, PR-, HER2+) - Signed by Earlie Server, MD on 12/10/2020   03/15/2021 Imaging    PET scan showed no focal hypermetabolic activity to suggest skeletal metastasis.  Mild hypermetabolic activity along the right T7-8 paraspinal musculature.  Max SUV 3.3.  Likely postprocedural-   09/28/2021 Echocardiogram   further decrease of LVEF to 45%   10/14/2021 Imaging   PET  Post bilateral mastectomy and RIGHT axillary dissection without signs of recurrent or metastatic disease.   11/12/2021 Echocardiogram   LVEF of 45-50%.   02/18/2022 Imaging   MRI thoracic and lumbar spine w wo contrast  1. Negative MRIs of the thoracic and lumbar spine. No evidence for locally recurrent metastasis at the level of T7. No other new metastatic disease elsewhere with thoracolumbar spine. 2. No significant disc pathology, stenosis, or evidence for neural impingement.    02/25/2022 Echocardiogram   LVEF 55-60%.   03/16/2022 Imaging   PET showed Stable PET-CT. No findings for local recurrent breast cancer,locoregional adenopathy or  distant metastatic disease.    07/20/2022 Imaging   MRI Thoracic Spine w wo contrast 1. Interval resolution of the increased T2 signal contrast enhancement in the posterior elements of T7. No new lesion identified. 2. No spinal canal or neural foraminal stenosis.   08/23/2022 Echocardiogram   Stable low-normal LVEF at 50-55%.   10/05/2022 Imaging   PET restaging Stable examination without evidence of hypermetabolic recurrence or metastatic disease.   Persistent minimally metabolic stranding in the posterior bilateral gluteal soft tissues may reflect sequela of subcutaneous injections   02/24/2023 Echocardiogram   Echocardiogram showed stable low normal LVEF 50-55%.      INTERVAL HISTORY 42 yo female  with above oncology history reviewed by me presents for follow-up of management of metastatic Triple positive breast cancer.  Chronic back pain, unchanged.   Intermittent diarrhea, manageable.  + right shoulder pain, chronic.  X-ray showed no destructive lesions. Patient noticed worsening of her acid reflux and bilateral lower abdominal pain for 2 weeks, abdominal pain has improved, still has stomach discomfort.  No fever or chills.     Review of Systems  Constitutional:  Negative for chills, fever, malaise/fatigue and weight loss.  HENT:  Negative for sore throat.        Hair loss  Eyes:  Negative for redness.  Respiratory:  Negative for cough, shortness of breath and wheezing.   Cardiovascular:  Negative for chest pain, palpitations and leg swelling.  Gastrointestinal:  Negative for abdominal pain, blood in stool, heartburn, nausea and vomiting.  Genitourinary:  Negative for dysuria.  Musculoskeletal:  Positive for back pain and joint pain.  Skin:  Negative for rash.  Neurological:  Positive for tingling. Negative for dizziness and tremors.  Endo/Heme/Allergies:  Does not bruise/bleed easily.  Psychiatric/Behavioral:  Negative for hallucinations. The patient does not have insomnia.     No Known Allergies  Patient Active Problem List   Diagnosis Date Noted   Breast cancer of upper-outer quadrant of right female breast ([place]) 03/19/2018    Priority: High   Right shoulder pain 01/20/2023    Priority: Medium    Thyroid nodule 06/04/2022    Priority: Medium    Hypokalemia 06/03/2022    Priority: Medium    Malignant neoplasm metastatic to bone ([place]) 03/25/2021    Priority: Medium    Encounter for monoclonal antibody treatment for malignancy 12/10/2020    Priority: Medium    Hypocalcemia 11/19/2020    Priority: Medium    Neuropathy due to chemotherapeutic drug ([place]) 08/11/2020    Priority: Medium    Rheumatoid arthritis ([place]) 10/13/2019    Priority: Medium    Nonischemic  cardiomyopathy ([place]) 08/23/2018    Priority: Medium    Encounter for antineoplastic chemotherapy 10/29/2020    Priority: Low   Goals of care, counseling/discussion 08/11/2020    Priority: Low   Gastroesophageal reflux disease without esophagitis 02/24/2017    Priority: Low   Tachycardia, unspecified 04/15/2021   Bone lesion 11/19/2020   Inflammatory arthritis 11/19/2020   HER2-positive carcinoma of breast ([place]) 08/11/2020   Bilateral hand swelling 10/03/2019   Fracture of neck of metacarpal bone 05/14/2019   Chronic fatigue 04/09/2019   Polyarthralgia 04/09/2019   Status post right breast reconstruction 02/26/2019   Status post right mastectomy 02/26/2019   Mastalgia 02/15/2019   Shortness of breath 08/23/2018   Preprocedural cardiovascular examination 08/23/2018   Tachycardia 08/23/2018   Palpitations 08/23/2018   Estrogen receptor positive status (ER+) 04/04/2018   Acquired absence of right breast  and nipple 04/03/2018   Family history of cancer    Malignant neoplasm of overlapping sites of right breast in female, estrogen receptor positive ([place]) 10/19/2017   Generalized anxiety disorder 10/03/2014   Headache 10/03/2014     Past Medical History:  Diagnosis Date   Anemia    Arthritis    BRCA negative 11/26/2017   Breast cancer ([place]) 10/11/2017   Multifocal, ER positive, PR negative, HER-2 negative. ypT3 ypN2a 8.7 cm, 4/15 nodes   Cardiomyopathy ([place])    a. 10/2017 Echo: EF 60-65%, no rwma, Gr1 DD, nl RV size/fxn; b. 04/2018 Echo: EF 55-60%, no rwma, Nl RV size/fxn; c. 08/2018 Echo: EF 45%, diff HK, ? HK of antsept wall. Gr1 DD. Mild MR. Mild LAE/RAE. Mod dil RV.    Chronic bronchitis ([place]) 11/2017   COPD (chronic obstructive pulmonary disease) (HCC)    MILD PER CXR   Depression    Family history of cancer    GERD (gastroesophageal reflux disease)    Headache    MIGRAINES   Heart murmur    ASYMPTOMATIC   Personal history of chemotherapy    current for right breast ca      Past Surgical History:  Procedure Laterality Date   AXILLARY LYMPH NODE DISSECTION Right 03/19/2018   Procedure: AXILLARY LYMPH NODE DISSECTION;  Surgeon: Robert Bellow, MD;  Location: [facility];  Service: General;  Laterality: Right;   BREAST BIOPSY Right 10/11/2017   12:30 posterior coil clip invasive mammary carcinoma   BREAST BIOPSY Right 10/11/2017   11:30 middle depth ribbon clip DCIS   BREAST BIOPSY Right 10/11/2017   5:30 anterior depth x shape invasive ductal carcinoma   BREAST IMPLANT REMOVAL Right 06/03/2019   Procedure: REMOVAL OF RIGHT BREAST IMPLANTS;  Surgeon: Wallace Going, DO;  Location: [facility];  Service: Plastics;  Laterality: Right;   BREAST RECONSTRUCTION WITH PLACEMENT OF TISSUE EXPANDER AND FLEX HD (ACELLULAR HYDRATED DERMIS) Right 03/19/2018   Procedure: BREAST RECONSTRUCTION WITH PLACEMENT OF TISSUE EXPANDER AND FLEX HD (ACELLULAR HYDRATED DERMIS);  Surgeon: Wallace Going, DO;  Location: [facility];  Service: Plastics;  Laterality: Right;   CARPAL TUNNEL RELEASE Bilateral 2020   CHOLECYSTECTOMY N/A 04/27/2020   Procedure: LAPAROSCOPIC CHOLECYSTECTOMY WITH INTRAOPERATIVE CHOLANGIOGRAM;  Surgeon: Robert Bellow, MD;  Location: [facility];  Service: General;  Laterality: N/A;   ESOPHAGOGASTRODUODENOSCOPY (EGD) WITH PROPOFOL N/A 04/17/2020   Procedure: ESOPHAGOGASTRODUODENOSCOPY (EGD) WITH PROPOFOL;  Surgeon: Robert Bellow, MD;  Location: [facility];  Service: Endoscopy;  Laterality: N/A;  with biopsy   LAPAROSCOPIC BILATERAL SALPINGO OOPHERECTOMY Bilateral 03/19/2018   Procedure: LAPAROSCOPIC BILATERAL SALPINGO OOPHORECTOMY;  Surgeon: Benjaman Kindler, MD;  Location: [facility];  Service: Gynecology;  Laterality: Bilateral;   MASTECTOMY Right 03/2018   MASTECTOMY W/ SENTINEL NODE BIOPSY Right 03/19/2018   Procedure: MASTECTOMY WITH SENTINEL LYMPH NODE BIOPSY;  Surgeon: Robert Bellow, MD;  Location: [facility];  Service: General;  Laterality:  Right;   PORT-A-CATH REMOVAL Left 06/03/2019   Procedure: REMOVAL PORT-A-CATH;  Surgeon: Robert Bellow, MD;  Location: [facility];  Service: General;  Laterality: Left;   PORTACATH PLACEMENT Left 10/24/2017   Procedure: INSERTION PORT-A-CATH;  Surgeon: Robert Bellow, MD;  Location: [facility];  Service: General;  Laterality: Left;   PORTACATH PLACEMENT Right 09/28/2020   Procedure: INSERTION PORT-A-CATH;  Surgeon: Robert Bellow, MD;  Location: [facility];  Service: General;  Laterality: Right;   REMOVAL OF TISSUE EXPANDER AND PLACEMENT OF IMPLANT Right 07/20/2018  Procedure: REMOVAL OF RIGHT BREAST TISSUE EXPANDER AND PLACEMENT OF IMPLANT;  Surgeon: Wallace Going, DO;  Location: [place];  Service: Plastics;  Laterality: Right;   SIMPLE MASTECTOMY WITH AXILLARY SENTINEL NODE BIOPSY Left 06/03/2019   Procedure: SIMPLE MASTECTOMY LEFT;  Surgeon: Robert Bellow, MD;  Location: [facility];  Service: General;  Laterality: Left;    Social History   Socioeconomic History   Marital status: Married    Spouse name: Not on file   Number of children: Not on file   Years of education: Not on file   Highest education level: Not on file  Occupational History   Occupation: pharmacy tech    Comment: [employer]   Tobacco Use   Smoking status: Former    Packs/day: 0.50    Years: 18.00    Additional pack years: 0.00    Total pack years: 9.00    Types: Cigarettes    Start date: 06/21/2018    Quit date: 12/11/2022    Years since quitting: 0.2   Smokeless tobacco: Never  Vaping Use   Vaping Use: Never used  Substance and Sexual Activity   Alcohol use: No   Drug use: No   Sexual activity: Yes    Birth control/protection: Injection, Other-see comments    Comment: has had hysterectomy  Other Topics Concern   Not on file  Social History Narrative   Lives at home with husband and daughter   Social Determinants of Health   Financial  Resource Strain: Not on file  Food Insecurity: Not on file  Transportation Needs: Not on file  Physical Activity: Not on file  Stress: Not on file  Social Connections: Not on file  Intimate Partner Violence: Not on file     Family History  Problem Relation Age of Onset   Diabetes Father    Hypertension Father    Hyperlipidemia Father    Heart attack Father 37       "mild"   Melanoma Maternal Aunt        other aunts with BCC/SCC/Melanoma   Cervical cancer Maternal Aunt 68       daughter w/ cervical cancer as well   Lung cancer Maternal Aunt    Melanoma Maternal Uncle        other uncles with BCC/SCC/Melanoma   Breast cancer Paternal Aunt    Bladder Cancer Maternal Grandmother   Biological mother had Grave's disease.    Current Outpatient Medications:    acetaminophen (TYLENOL) 500 MG tablet, Take 500 mg by mouth every 6 (six) hours as needed., Disp: , Rfl:    albuterol (VENTOLIN HFA) 108 (90 Base) MCG/ACT inhaler, Inhale 2 puffs into the lungs every 6 (six) hours as needed for wheezing or shortness of breath. , Disp: , Rfl:    CALCIUM CARBONATE-VITAMIN D PO, Take 600-800 mg by mouth in the morning, at noon, and at bedtime., Disp: , Rfl:    chlorhexidine (PERIDEX) 0.12 % solution, Use as directed 15 mLs in the mouth or throat 2 (two) times daily., Disp: 120 mL, Rfl: 0   cyanocobalamin (,VITAMIN B-12,) 1000 MCG/ML injection, Inject 1,000 mcg into the skin every 30 (thirty) days., Disp: , Rfl:    cyclobenzaprine (FLEXERIL) 5 MG tablet, Take 5 mg by mouth 3 (three) times daily as needed for muscle spasms., Disp: , Rfl:    diphenoxylate-atropine (LOMOTIL) 2.5-0.025 MG tablet, Take 1 tablet by mouth 4 (four) times daily as needed for diarrhea  or loose stools., Disp: 60 tablet, Rfl: 0   escitalopram (LEXAPRO) 20 MG tablet, Take 20 mg by mouth daily., Disp: , Rfl:    esomeprazole (NEXIUM) 40 MG capsule, Take 40 mg by mouth daily before breakfast. , Disp: , Rfl:    Eszopiclone 3 MG TABS,  Take 3 mg by mouth at bedtime as needed (sleep). , Disp: , Rfl:    folic acid (FOLVITE) 1 MG tablet, Take 1 mg by mouth daily., Disp: , Rfl:    hydroxychloroquine (PLAQUENIL) 200 MG tablet, Take 200 mg by mouth daily., Disp: , Rfl:    ibuprofen (ADVIL) 800 MG tablet, Take 800 mg by mouth every 8 (eight) hours as needed for moderate pain., Disp: , Rfl:    loperamide (IMODIUM) 2 MG capsule, Take 1 tablet (2 mg total) by mouth See admin instructions. Take 2 tablets with onset of diarrhea, then take 1 tablet every 2 hours until diarrhea stops. Maximum 8 tablets in 24, Disp: 90 capsule, Rfl: 0   loratadine (CLARITIN) 10 MG tablet, Take 10 mg by mouth daily. , Disp: , Rfl:    LORazepam (ATIVAN) 1 MG tablet, Take 1 mg by mouth 3 (three) times daily., Disp: , Rfl:    Magnesium 500 MG TABS, Take 500 mg by mouth 2 (two) times daily., Disp: , Rfl:    methocarbamol (ROBAXIN) 500 MG tablet, Take by mouth., Disp: , Rfl:    methotrexate (RHEUMATREX) 2.5 MG tablet, Take 25 mg by mouth every Sunday. 10 tablets once a week, Disp: , Rfl:    metoprolol tartrate (LOPRESSOR) 25 MG tablet, Take 0.5 tablets (12.5 mg total) by mouth as needed., Disp: 30 tablet, Rfl: 0   mupirocin ointment (BACTROBAN) 2 %, Place 1 application into the nose 2 (two) times daily. Use in each nostril twice daily for five (5) days., Disp: 22 g, Rfl: 5   nortriptyline (PAMELOR) 10 MG capsule, Take 30 mg by mouth at bedtime. , Disp: , Rfl:    oxyCODONE (OXY IR/ROXICODONE) 5 MG immediate release tablet, Take 1 tablet (5 mg total) by mouth every 6 (six) hours as needed for moderate pain or severe pain., Disp: 90 tablet, Rfl: 0   phentermine (ADIPEX-P) 37.5 MG tablet, Take 37.5 mg by mouth daily before breakfast. , Disp: , Rfl:    pregabalin (LYRICA) 150 MG capsule, Take 150 mg by mouth 2 (two) times daily., Disp: , Rfl:    promethazine (PHENERGAN) 25 MG tablet, Take 1 tablet (25 mg total) by mouth every 8 (eight) hours as needed for nausea or  vomiting., Disp: 90 tablet, Rfl: 0   pyridOXINE (VITAMIN B-6) 100 MG tablet, Take 100 mg by mouth daily., Disp: , Rfl:    topiramate (TOPAMAX) 50 MG tablet, Take 50 mg by mouth 2 (two) times daily. , Disp: , Rfl:    vitamin C (ASCORBIC ACID) 500 MG tablet, Take 500 mg by mouth daily., Disp: , Rfl:  No current facility-administered medications for this visit.  Facility-Administered Medications Ordered in Other Visits:    heparin lock flush 100 unit/mL, 500 Units, Intravenous, Once, Earlie Server, MD   sodium chloride flush (NS) 0.9 % injection 10 mL, 10 mL, Intravenous, PRN, Earlie Server, MD, 10 mL at 11/19/18 1249   sodium chloride flush (NS) 0.9 % injection 10 mL, 10 mL, Intravenous, Once, Earlie Server, MD   Physical exam:  Vitals:   03/03/23 0847  BP: 109/73  Pulse: (!) 105  Resp: 18  Temp: (!) 97 F (36.1  C)  Weight: 183 lb 3.2 oz (83.1 kg)  ECOG 1 Physical Exam Constitutional:      General: She is not in acute distress.    Appearance: She is not diaphoretic.  HENT:     Head: Normocephalic and atraumatic.     Nose: Nose normal.  Eyes:     General: No scleral icterus. Neck:     Vascular: No JVD.  Cardiovascular:     Rate and Rhythm: Normal rate.  Pulmonary:     Effort: Pulmonary effort is normal. No respiratory distress.     Breath sounds: Normal breath sounds. No wheezing.  Abdominal:     General: There is no distension.     Palpations: Abdomen is soft.  Musculoskeletal:     Cervical back: Normal range of motion.     Comments: Limited right shoulder range of moton due to pain  Lymphadenopathy:     Cervical: No cervical adenopathy.  Skin:    General: Skin is warm.     Findings: No erythema or rash.  Neurological:     Mental Status: She is alert and oriented to person, place, and time. Mental status is at baseline.     Cranial Nerves: No cranial nerve deficit.     Motor: No abnormal muscle tone.     Coordination: Coordination normal.  Psychiatric:        Mood and Affect:  Mood and affect normal.        Labs     Latest Ref Rng & Units 03/03/2023    8:12 AM  CMP  Glucose 70 - 99 mg/dL 99   BUN 6 - 20 mg/dL 17   Creatinine 0.44 - 1.00 mg/dL 0.86   Sodium 135 - 145 mmol/L 137   Potassium 3.5 - 5.1 mmol/L 3.5   Chloride 98 - 111 mmol/L 107   CO2 22 - 32 mmol/L 23   Calcium 8.9 - 10.3 mg/dL 8.9   Total Protein 6.5 - 8.1 g/dL 7.5   Total Bilirubin 0.3 - 1.2 mg/dL 0.3   Alkaline Phos 38 - 126 U/L 72   AST 15 - 41 U/L 27   ALT 0 - 44 U/L 15       Latest Ref Rng & Units 03/03/2023    8:12 AM  CBC  WBC 4.0 - 10.5 K/uL 6.3   Hemoglobin 12.0 - 15.0 g/dL 11.1   Hematocrit 36.0 - 46.0 % 33.4   Platelets 150 - 400 K/uL 330    RADIOGRAPHIC STUDIES: I have personally reviewed the radiological images as listed and agreed with the findings in the report. ECHOCARDIOGRAM LIMITED  Result Date: 02/24/2023    ECHOCARDIOGRAM LIMITED REPORT   Patient Name:   Theresa Chaney Date of Exam: 02/24/2023 Medical Rec #:  [id number]      Height:       67.0 in Accession #:    [id number]     Weight:       186.9 lb Date of Birth:  [birth year]      BSA:          1.965 m Patient Age:    42 years       BP:           123/75 mmHg Patient Gender: F              HR:           94 bpm. Exam Location:  [facility] Procedure: 3D Echo, Limited Echo, Limited Color Doppler  and Strain Analysis Indications:    I42.80 Non-ischemic cardiomyopathy  History:        Patient has prior history of Echocardiogram examinations, most                 recent 11/25/2022. Cardiomyopathy, COPD, Arrythmias:Tachycardia,                 Signs/Symptoms:Murmur; Risk Factors:Former Smoker and h/o                 Chemotherapy.  Sonographer:    Pilar Jarvis RDMS, RVT, RDCS Referring Phys: [id number] Areta Haber DUNN  Sonographer Comments: New equipment c/t previous echo allowed for good quality DHM and GLS IMPRESSIONS  1. Left ventricular ejection fraction, by estimation, is 50 to 55%. Left ventricular ejection fraction by 3D volume is  52 %. The left ventricle has low normal function. The left ventricle has no regional wall motion abnormalities. Left ventricular diastolic parameters were normal. The average left ventricular global longitudinal strain is -18.7 %.  2. Right ventricular systolic function is normal. The right ventricular size is normal.  3. The mitral valve is normal in structure. No evidence of mitral valve regurgitation. No evidence of mitral stenosis.  4. The aortic valve has an indeterminant number of cusps. Aortic valve regurgitation is not visualized. No aortic stenosis is present.  5. The inferior vena cava is normal in size with greater than 50% respiratory variability, suggesting right atrial pressure of 3 mmHg. Comparison(s): Suboptimal GLS on previous exam. FINDINGS  Left Ventricle: Left ventricular ejection fraction, by estimation, is 50 to 55%. Left ventricular ejection fraction by 3D volume is 52 %. The left ventricle has low normal function. The left ventricle has no regional wall motion abnormalities. The average left ventricular global longitudinal strain is -18.7 %. The left ventricular internal cavity size was normal in size. There is no left ventricular hypertrophy. Left ventricular diastolic parameters were normal. Right Ventricle: The right ventricular size is normal. No increase in right ventricular wall thickness. Right ventricular systolic function is normal. Left Atrium: Left atrial size was normal in size. Right Atrium: Right atrial size was normal in size. Pericardium: There is no evidence of pericardial effusion. Mitral Valve: The mitral valve is normal in structure. No evidence of mitral valve stenosis. Tricuspid Valve: The tricuspid valve is normal in structure. Tricuspid valve regurgitation is not demonstrated. No evidence of tricuspid stenosis. Aortic Valve: The aortic valve has an indeterminant number of cusps. Aortic valve regurgitation is not visualized. No aortic stenosis is present. Pulmonic  Valve: The pulmonic valve was normal in structure. Pulmonic valve regurgitation is not visualized. No evidence of pulmonic stenosis. Aorta: The aortic root is normal in size and structure. Venous: The inferior vena cava is normal in size with greater than 50% respiratory variability, suggesting right atrial pressure of 3 mmHg. IAS/Shunts: No atrial level shunt detected by color flow Doppler. Additional Comments: Spectral Doppler performed. Color Doppler performed.  LEFT VENTRICLE PLAX 2D LVIDd:         5.00 cm         Diastology LVIDs:         3.60 cm         LV e' medial:    7.72 cm/s LV PW:         0.80 cm         LV E/e' medial:  13.5 LV IVS:        0.90 cm  LV e' lateral:   13.10 cm/s                                LV E/e' lateral: 7.9                                 2D                                Longitudinal                                Strain                                2D Strain GLS  -18.7 %                                Avg:                                 3D Volume EF                                LV 3D EF:    Left                                             ventricul                                             ar                                             ejection                                             fraction                                             by 3D                                             volume is                                             52 %.  3D Volume EF:                                3D EF:        52 %                                LV EDV:       114 ml                                LV ESV:       55 ml                                LV SV:        60 ml RIGHT VENTRICLE RV S prime:     14.30 cm/s TAPSE (M-mode): 2.1 cm MITRAL VALVE MV Area (PHT): 5.20 cm MV Decel Time: 146 msec MV E velocity: 104.00 cm/s MV A velocity: 48.20 cm/s MV E/A ratio:  2.16 Ida Rogue MD Electronically signed by Ida Rogue MD Signature  Date/Time: 02/24/2023/2:36:22 PM    Final    DG Shoulder Right  Result Date: 01/21/2023 CLINICAL DATA:  Pain EXAM: RIGHT SHOULDER - 3 VIEW COMPARISON:  None Available. FINDINGS: There is no evidence of fracture or dislocation. There is no evidence of arthropathy or other focal bone abnormality. Soft tissues are unremarkable. IMPRESSION: Negative. Electronically Signed   By: Sammie Bench M.D.   On: 01/21/2023 00:49   US THYROID  Result Date: 12/14/2022 CLINICAL DATA:  Prior ultrasound follow-up. Right-sided 1.1 cm TI-RADS category 4 nodule currently under imaging surveillance EXAM: THYROID ULTRASOUND TECHNIQUE: Ultrasound examination of the thyroid gland and adjacent soft tissues was performed. COMPARISON:  Prior thyroid ultrasound 08/09/2021 FINDINGS: Parenchymal Echotexture: Normal Isthmus: 0.4 cm Right lobe: 6.5 x 1.5 x 2.4 cm Left lobe: 6.1 x 1.6 x 1.9 cm _________________________________________________________ Estimated total number of nodules >/= 1 cm: 1 Number of spongiform nodules >/=  2 cm not described below (TR1): 0 Number of mixed cystic and solid nodules >/= 1.5 cm not described below ([place]): 0 _________________________________________________________ Nodule # 1: Prior biopsy: No Location: Right; Superior Maximum size: 1.2 cm; Other 2 dimensions: 0.8 x 0.5 cm, previously, 1.1 x 0.8 x 0.5 cm Composition: solid/almost completely solid (2) Echogenicity: hypoechoic (2) Shape: not taller-than-wide (0) Margins: smooth (0) Echogenic foci: none (0) ACR TI-RADS total points: 4. ACR TI-RADS risk category:  TR4 (4-6 points). Significant change in size (>/= 20% in two dimensions and minimal increase of 2 mm): No Change in features: No Change in ACR TI-RADS risk category: No ACR TI-RADS recommendations: *Given size (>/= 1 - 1.4 cm) and appearance, a follow-up ultrasound in 1 year should be considered based on TI-RADS criteria. _________________________________________________________ Additional small  subcentimeter nodules present bilaterally. None meet criteria to warrant further evaluation. IMPRESSION: 1. Confirmed 1 year stability of TI-RADS category 4 nodule in the right superior gland. Recommend continued annual imaging surveillance until 5 year stability is confirmed. 2. No new nodules. The above is in keeping with the ACR TI-RADS recommendations - J Am Coll Radiol 2017;14:587-595. Electronically Signed   By: Jacqulynn Cadet M.D.   On: 12/14/2022 06:31

## 2023-03-03 NOTE — Assessment & Plan Note (Signed)
Check shouder X ray - negative for destructive bone lesions.

## 2023-03-03 NOTE — Patient Instructions (Signed)
[facility]  Discharge Instructions: Thank you for choosing [facility] to provide your oncology and hematology care.  If you have a lab appointment with the Cancer Center, please go directly to the Cancer Center and check in at the registration area.  Wear comfortable clothing and clothing appropriate for easy access to any Portacath or PICC line.   We strive to give you quality time with your provider. You may need to reschedule your appointment if you arrive late (15 or more minutes).  Arriving late affects you and other patients whose appointments are after yours.  Also, if you miss three or more appointments without notifying the office, you may be dismissed from the clinic at the provider's discretion.      For prescription refill requests, have your pharmacy contact our office and allow 72 hours for refills to be completed.    Today you received the following chemotherapy and/or immunotherapy agents Kanjinti and Perjeta      To help prevent nausea and vomiting after your treatment, we encourage you to take your nausea medication as directed.  BELOW ARE SYMPTOMS THAT SHOULD BE REPORTED IMMEDIATELY: *FEVER GREATER THAN 100.4 F (38 C) OR HIGHER *CHILLS OR SWEATING *NAUSEA AND VOMITING THAT IS NOT CONTROLLED WITH YOUR NAUSEA MEDICATION *UNUSUAL SHORTNESS OF BREATH *UNUSUAL BRUISING OR BLEEDING *URINARY PROBLEMS (pain or burning when urinating, or frequent urination) *BOWEL PROBLEMS (unusual diarrhea, constipation, pain near the anus) TENDERNESS IN MOUTH AND THROAT WITH OR WITHOUT PRESENCE OF ULCERS (sore throat, sores in mouth, or a toothache) UNUSUAL RASH, SWELLING OR PAIN  UNUSUAL VAGINAL DISCHARGE OR ITCHING   Items with * indicate a potential emergency and should be followed up as soon as possible or go to the Emergency Department if any problems should occur.  Please show the CHEMOTHERAPY ALERT CARD or IMMUNOTHERAPY ALERT CARD at  check-in to the Emergency Department and triage nurse.  Should you have questions after your visit or need to cancel or reschedule your appointment, please contact [facility]  [phone] and follow the prompts.  Office hours are 8:00 a.m. to 4:30 p.m. Monday - Friday. Please note that voicemails left after 4:00 p.m. may not be returned until the following business day.  We are closed weekends and major holidays. You have access to a nurse at all times for urgent questions. Please call the main number to the clinic [phone] and follow the prompts.  For any non-urgent questions, you may also contact your provider using MyChart. We now offer e-Visits for anyone 18 and older to request care online for non-urgent symptoms. For details visit [email or web address].   Also download the MyChart app! Go to the app store, search "MyChart", open the app, select , and log in with your MyChart username and password.    

## 2023-03-03 NOTE — Assessment & Plan Note (Signed)
Antibody treatment plan as list above. 

## 2023-03-03 NOTE — Assessment & Plan Note (Signed)
Follow up with  rheumatology.  Patient is on methotrexate,  plaquenil.  

## 2023-03-03 NOTE — Assessment & Plan Note (Addendum)
Continue calcium supplementation 600mg  TID.  normal Vitamin D level

## 2023-03-03 NOTE — Assessment & Plan Note (Signed)
She does not tolerate oral potassium.  Potassium is stable. 

## 2023-03-03 NOTE — Assessment & Plan Note (Signed)
status post radiation. Continue Zometa monthly, hold if Calcium is <8.6 Continue calcium supplementation. Continue oxycodone as needed.   

## 2023-03-03 NOTE — Progress Notes (Signed)
Pt here for follow up. Reports that she was having abdominal discomfort, which she believed it was why her bowel were messed up. Bowels have improved. Pt also reports that she continues to have pain to right shoulder and increased reflux.

## 2023-03-03 NOTE — Assessment & Plan Note (Signed)
Left lobe nodules are small no need for follow-up.   Right lobe nodule 1.1 cm,stable on  thyroid ultrasound - next Due Jan 2025 annual imaging surveillance until 5 year stability is confirmed.

## 2023-03-03 NOTE — Assessment & Plan Note (Addendum)
Continue PPI and tums.  She may have had viral gastroenteritis which is getting better. Continue supportive care.  Encourage hydration.

## 2023-03-03 NOTE — Assessment & Plan Note (Signed)
Chronic tachycardia Stable low-normal LVEF 50-55%. Follow up with cardiology. She gets ECHO Q3 months. 

## 2023-03-03 NOTE — Assessment & Plan Note (Signed)
continue Lyrica and nortriptyline symptoms stable 

## 2023-03-03 NOTE — Assessment & Plan Note (Addendum)
Recurrent breast cancer with isolated bone metastasis, stage IV #Initially stage IIIA right breast cancer, ER positive, HER-2 negative status post bilateral oophorectomy,Right mastectomy and right axillary lymph node dissection, status post implant, and implant removal.  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 ER positive, HER-2 positive  .- s/p Palliative radiation to T7  Labs reviewed and discussed Proceed with Kanjinti Perjecta.   Continue fulvestrant monthly. Repeat PET scan in late April 2024.

## 2023-03-14 ENCOUNTER — Other Ambulatory Visit: Payer: Self-pay | Admitting: Physician Assistant

## 2023-03-14 ENCOUNTER — Other Ambulatory Visit: Payer: Self-pay

## 2023-03-17 ENCOUNTER — Inpatient Hospital Stay: Payer: Medicare HMO

## 2023-03-17 ENCOUNTER — Inpatient Hospital Stay: Payer: Medicare HMO | Attending: Oncology

## 2023-03-17 VITALS — BP 103/74 | HR 101 | Temp 97.6°F

## 2023-03-17 DIAGNOSIS — Z5112 Encounter for antineoplastic immunotherapy: Secondary | ICD-10-CM | POA: Diagnosis present

## 2023-03-17 DIAGNOSIS — C7951 Secondary malignant neoplasm of bone: Secondary | ICD-10-CM | POA: Insufficient documentation

## 2023-03-17 DIAGNOSIS — Z79899 Other long term (current) drug therapy: Secondary | ICD-10-CM | POA: Insufficient documentation

## 2023-03-17 DIAGNOSIS — Z170 Estrogen receptor positive status [ER+]: Secondary | ICD-10-CM

## 2023-03-17 DIAGNOSIS — C50411 Malignant neoplasm of upper-outer quadrant of right female breast: Secondary | ICD-10-CM | POA: Insufficient documentation

## 2023-03-17 LAB — BASIC METABOLIC PANEL
Anion gap: 4 — ABNORMAL LOW (ref 5–15)
BUN: 18 mg/dL (ref 6–20)
CO2: 23 mmol/L (ref 22–32)
Calcium: 8.5 mg/dL — ABNORMAL LOW (ref 8.9–10.3)
Chloride: 109 mmol/L (ref 98–111)
Creatinine, Ser: 0.86 mg/dL (ref 0.44–1.00)
GFR, Estimated: >60 mL/min (ref >60)
Glucose, Bld: 123 mg/dL — ABNORMAL HIGH (ref 70–99)
Potassium: 3.6 mmol/L (ref 3.5–5.1)
Sodium: 136 mmol/L (ref 135–145)

## 2023-03-17 MED ORDER — ZOLEDRONIC ACID 4 MG/100ML IV SOLN: 4.0000 mg | Freq: Once | INTRAVENOUS | Status: DC

## 2023-03-17 MED ORDER — FULVESTRANT 250 MG/5ML IM SOSY
500.0000 mg | PREFILLED_SYRINGE | Freq: Once | INTRAMUSCULAR | Status: AC
  Administered 2023-03-17: 500 mg via INTRAMUSCULAR
  Filled 2023-03-17: qty 10

## 2023-03-17 MED ORDER — FULVESTRANT 250 MG/5ML IM SOSY
500.0000 mg | PREFILLED_SYRINGE | INTRAMUSCULAR | Status: DC
  Filled 2023-03-17: qty 10

## 2023-03-17 NOTE — Patient Instructions (Addendum)

## 2023-03-24 ENCOUNTER — Other Ambulatory Visit: Payer: Self-pay | Admitting: Oncology

## 2023-03-24 ENCOUNTER — Inpatient Hospital Stay: Payer: Medicare HMO

## 2023-03-24 ENCOUNTER — Other Ambulatory Visit: Payer: Self-pay

## 2023-03-24 VITALS — BP 120/72 | HR 105 | Temp 97.8°F | Ht 67.0 in | Wt 186.8 lb

## 2023-03-24 DIAGNOSIS — E876 Hypokalemia: Secondary | ICD-10-CM

## 2023-03-24 DIAGNOSIS — Z170 Estrogen receptor positive status [ER+]: Secondary | ICD-10-CM

## 2023-03-24 DIAGNOSIS — Z5112 Encounter for antineoplastic immunotherapy: Secondary | ICD-10-CM | POA: Diagnosis not present

## 2023-03-24 DIAGNOSIS — R252 Cramp and spasm: Secondary | ICD-10-CM

## 2023-03-24 LAB — COMPREHENSIVE METABOLIC PANEL
ALT: 21 U/L (ref 0–44)
AST: 34 U/L (ref 15–41)
Albumin: 4.2 g/dL (ref 3.5–5.0)
Alkaline Phosphatase: 70 U/L (ref 38–126)
Anion gap: 7 (ref 5–15)
BUN: 21 mg/dL — ABNORMAL HIGH (ref 6–20)
CO2: 24 mmol/L (ref 22–32)
Calcium: 8.9 mg/dL (ref 8.9–10.3)
Chloride: 107 mmol/L (ref 98–111)
Creatinine, Ser: 0.99 mg/dL (ref 0.44–1.00)
GFR, Estimated: >60 mL/min (ref >60)
Glucose, Bld: 107 mg/dL — ABNORMAL HIGH (ref 70–99)
Potassium: 3.2 mmol/L — ABNORMAL LOW (ref 3.5–5.1)
Sodium: 138 mmol/L (ref 135–145)
Total Bilirubin: 0.5 mg/dL (ref 0.3–1.2)
Total Protein: 7.1 g/dL (ref 6.5–8.1)

## 2023-03-24 LAB — CBC WITH DIFFERENTIAL/PLATELET
Abs Immature Granulocytes: 0.02 10*3/uL (ref 0.00–0.07)
Basophils Absolute: 0 10*3/uL (ref 0.0–0.1)
Basophils Relative: 1 %
Eosinophils Absolute: 0.1 10*3/uL (ref 0.0–0.5)
Eosinophils Relative: 2 %
HCT: 29.7 % — ABNORMAL LOW (ref 36.0–46.0)
Hemoglobin: 9.7 g/dL — ABNORMAL LOW (ref 12.0–15.0)
Immature Granulocytes: 0 %
Lymphocytes Relative: 31 %
Lymphs Abs: 2.1 10*3/uL (ref 0.7–4.0)
MCH: 29 pg (ref 26.0–34.0)
MCHC: 32.7 g/dL (ref 30.0–36.0)
MCV: 88.9 fL (ref 80.0–100.0)
Monocytes Absolute: 0.7 10*3/uL (ref 0.1–1.0)
Monocytes Relative: 10 %
Neutro Abs: 3.7 10*3/uL (ref 1.7–7.7)
Neutrophils Relative %: 56 %
Platelets: 279 10*3/uL (ref 150–400)
RBC: 3.34 MIL/uL — ABNORMAL LOW (ref 3.87–5.11)
RDW: 13.5 % (ref 11.5–15.5)
WBC: 6.6 10*3/uL (ref 4.0–10.5)
nRBC: 0 % (ref 0.0–0.2)

## 2023-03-24 LAB — MAGNESIUM: Magnesium: 2.1 mg/dL (ref 1.7–2.4)

## 2023-03-24 MED ORDER — ZOLEDRONIC ACID 4 MG/100ML IV SOLN: 4.0000 mg | Freq: Once | INTRAVENOUS | Status: DC

## 2023-03-24 MED ORDER — TRASTUZUMAB-ANNS CHEMO 150 MG IV SOLR
6.0000 mg/kg | Freq: Once | INTRAVENOUS | Status: AC
  Administered 2023-03-24: 462 mg via INTRAVENOUS
  Filled 2023-03-24: qty 22

## 2023-03-24 MED ORDER — SODIUM CHLORIDE 0.9 % IV SOLN
Freq: Once | INTRAVENOUS | Status: AC
  Filled 2023-03-24: qty 250

## 2023-03-24 MED ORDER — HEPARIN SOD (PORK) LOCK FLUSH 100 UNIT/ML IV SOLN
500.0000 [IU] | Freq: Once | INTRAVENOUS | Status: AC | PRN
  Administered 2023-03-24: 500 [IU]
  Filled 2023-03-24: qty 5

## 2023-03-24 MED ORDER — POTASSIUM CHLORIDE 20 MEQ/100ML IV SOLN
20.0000 meq | Freq: Once | INTRAVENOUS | Status: AC
  Administered 2023-03-24: 20 meq via INTRAVENOUS

## 2023-03-24 MED ORDER — SODIUM CHLORIDE 0.9 % IV SOLN
420.0000 mg | Freq: Once | INTRAVENOUS | Status: AC
  Administered 2023-03-24: 420 mg via INTRAVENOUS
  Filled 2023-03-24: qty 14

## 2023-03-27 ENCOUNTER — Encounter: Payer: Self-pay | Admitting: Oncology

## 2023-03-31 ENCOUNTER — Ambulatory Visit: Payer: Medicare HMO

## 2023-04-06 ENCOUNTER — Ambulatory Visit: Payer: Medicare HMO

## 2023-04-07 ENCOUNTER — Ambulatory Visit
Admission: RE | Admit: 2023-04-07 | Discharge: 2023-04-07 | Disposition: A | Discharge status: Home / Self Care | Payer: Medicare HMO | Source: Ambulatory Visit | Attending: Oncology | Admitting: Oncology

## 2023-04-07 DIAGNOSIS — C50811 Malignant neoplasm of overlapping sites of right female breast: Secondary | ICD-10-CM | POA: Diagnosis present

## 2023-04-07 DIAGNOSIS — Z170 Estrogen receptor positive status [ER+]: Secondary | ICD-10-CM

## 2023-04-07 DIAGNOSIS — Z9013 Acquired absence of bilateral breasts and nipples: Secondary | ICD-10-CM | POA: Diagnosis not present

## 2023-04-07 DIAGNOSIS — R222 Localized swelling, mass and lump, trunk: Secondary | ICD-10-CM | POA: Insufficient documentation

## 2023-04-07 LAB — GLUCOSE, CAPILLARY: Glucose-Capillary: 84 mg/dL (ref 70–99)

## 2023-04-07 MED ORDER — FLUDEOXYGLUCOSE F - 18 (FDG) INJECTION
9.6000 | Freq: Once | INTRAVENOUS | Status: AC | PRN
  Administered 2023-04-07: 10.04 via INTRAVENOUS

## 2023-04-14 ENCOUNTER — Inpatient Hospital Stay: Payer: Medicare HMO

## 2023-04-14 ENCOUNTER — Encounter: Payer: Self-pay | Admitting: Oncology

## 2023-04-14 ENCOUNTER — Inpatient Hospital Stay: Payer: Medicare HMO | Attending: Oncology

## 2023-04-14 ENCOUNTER — Inpatient Hospital Stay (HOSPITAL_BASED_OUTPATIENT_CLINIC_OR_DEPARTMENT_OTHER): Payer: Medicare HMO | Admitting: Oncology

## 2023-04-14 VITALS — BP 106/69 | HR 102 | Temp 96.8°F | Resp 18 | Wt 185.0 lb

## 2023-04-14 DIAGNOSIS — I428 Other cardiomyopathies: Secondary | ICD-10-CM

## 2023-04-14 DIAGNOSIS — Z170 Estrogen receptor positive status [ER+]: Secondary | ICD-10-CM

## 2023-04-14 DIAGNOSIS — C50411 Malignant neoplasm of upper-outer quadrant of right female breast: Secondary | ICD-10-CM

## 2023-04-14 DIAGNOSIS — C50811 Malignant neoplasm of overlapping sites of right female breast: Secondary | ICD-10-CM

## 2023-04-14 DIAGNOSIS — L0102 Bockhart's impetigo: Secondary | ICD-10-CM | POA: Insufficient documentation

## 2023-04-14 DIAGNOSIS — E876 Hypokalemia: Secondary | ICD-10-CM

## 2023-04-14 DIAGNOSIS — C7951 Secondary malignant neoplasm of bone: Secondary | ICD-10-CM | POA: Diagnosis present

## 2023-04-14 DIAGNOSIS — Z0000 Encounter for general adult medical examination without abnormal findings: Secondary | ICD-10-CM

## 2023-04-14 DIAGNOSIS — Z5112 Encounter for antineoplastic immunotherapy: Secondary | ICD-10-CM | POA: Diagnosis present

## 2023-04-14 DIAGNOSIS — R948 Abnormal results of function studies of other organs and systems: Secondary | ICD-10-CM

## 2023-04-14 DIAGNOSIS — Z5111 Encounter for antineoplastic chemotherapy: Secondary | ICD-10-CM | POA: Insufficient documentation

## 2023-04-14 DIAGNOSIS — Z79899 Other long term (current) drug therapy: Secondary | ICD-10-CM | POA: Diagnosis not present

## 2023-04-14 DIAGNOSIS — R198 Other specified symptoms and signs involving the digestive system and abdomen: Secondary | ICD-10-CM

## 2023-04-14 DIAGNOSIS — G620 Drug-induced polyneuropathy: Secondary | ICD-10-CM

## 2023-04-14 DIAGNOSIS — R21 Rash and other nonspecific skin eruption: Secondary | ICD-10-CM

## 2023-04-14 DIAGNOSIS — T451X5A Adverse effect of antineoplastic and immunosuppressive drugs, initial encounter: Secondary | ICD-10-CM

## 2023-04-14 LAB — CBC WITH DIFFERENTIAL/PLATELET
Abs Immature Granulocytes: 0.01 10*3/uL (ref 0.00–0.07)
Basophils Absolute: 0 10*3/uL (ref 0.0–0.1)
Basophils Relative: 1 %
Eosinophils Absolute: 0.1 10*3/uL (ref 0.0–0.5)
Eosinophils Relative: 2 %
HCT: 32.1 % — ABNORMAL LOW (ref 36.0–46.0)
Hemoglobin: 10.5 g/dL — ABNORMAL LOW (ref 12.0–15.0)
Immature Granulocytes: 0 %
Lymphocytes Relative: 31 %
Lymphs Abs: 1.7 10*3/uL (ref 0.7–4.0)
MCH: 29.2 pg (ref 26.0–34.0)
MCHC: 32.7 g/dL (ref 30.0–36.0)
MCV: 89.2 fL (ref 80.0–100.0)
Monocytes Absolute: 0.5 10*3/uL (ref 0.1–1.0)
Monocytes Relative: 9 %
Neutro Abs: 3.2 10*3/uL (ref 1.7–7.7)
Neutrophils Relative %: 57 %
Platelets: 285 10*3/uL (ref 150–400)
RBC: 3.6 MIL/uL — ABNORMAL LOW (ref 3.87–5.11)
RDW: 13.6 % (ref 11.5–15.5)
WBC: 5.6 10*3/uL (ref 4.0–10.5)
nRBC: 0 % (ref 0.0–0.2)

## 2023-04-14 LAB — COMPREHENSIVE METABOLIC PANEL
ALT: 18 U/L (ref 0–44)
AST: 30 U/L (ref 15–41)
Albumin: 4.1 g/dL (ref 3.5–5.0)
Alkaline Phosphatase: 60 U/L (ref 38–126)
Anion gap: 5 (ref 5–15)
BUN: 20 mg/dL (ref 6–20)
CO2: 24 mmol/L (ref 22–32)
Calcium: 8.7 mg/dL — ABNORMAL LOW (ref 8.9–10.3)
Chloride: 110 mmol/L (ref 98–111)
Creatinine, Ser: 0.94 mg/dL (ref 0.44–1.00)
GFR, Estimated: >60 mL/min (ref >60)
Glucose, Bld: 116 mg/dL — ABNORMAL HIGH (ref 70–99)
Potassium: 3.4 mmol/L — ABNORMAL LOW (ref 3.5–5.1)
Sodium: 139 mmol/L (ref 135–145)
Total Bilirubin: 0.2 mg/dL — ABNORMAL LOW (ref 0.3–1.2)
Total Protein: 7.1 g/dL (ref 6.5–8.1)

## 2023-04-14 LAB — SEDIMENTATION RATE: Sed Rate: 31 mm/hr — ABNORMAL HIGH (ref 0–20)

## 2023-04-14 MED ORDER — HEPARIN SOD (PORK) LOCK FLUSH 100 UNIT/ML IV SOLN
500.0000 [IU] | Freq: Once | INTRAVENOUS | Status: DC | PRN
  Filled 2023-04-14: qty 5

## 2023-04-14 MED ORDER — ZOLEDRONIC ACID 4 MG/100ML IV SOLN
4.0000 mg | Freq: Once | INTRAVENOUS | Status: AC
  Administered 2023-04-14: 4 mg via INTRAVENOUS
  Filled 2023-04-14: qty 100

## 2023-04-14 MED ORDER — SODIUM CHLORIDE 0.9 % IV SOLN
10.0000 mg | Freq: Once | INTRAVENOUS | Status: AC
  Administered 2023-04-14: 10 mg via INTRAVENOUS
  Filled 2023-04-14: qty 10

## 2023-04-14 MED ORDER — TRASTUZUMAB-ANNS CHEMO 150 MG IV SOLR
6.0000 mg/kg | Freq: Once | INTRAVENOUS | Status: AC
  Administered 2023-04-14: 462 mg via INTRAVENOUS
  Filled 2023-04-14: qty 22

## 2023-04-14 MED ORDER — SODIUM CHLORIDE 0.9 % IV SOLN
420.0000 mg | Freq: Once | INTRAVENOUS | Status: AC
  Administered 2023-04-14: 420 mg via INTRAVENOUS
  Filled 2023-04-14: qty 14

## 2023-04-14 MED ORDER — SODIUM CHLORIDE 0.9 % IV SOLN
Freq: Once | INTRAVENOUS | Status: AC
  Filled 2023-04-14: qty 250

## 2023-04-14 MED ORDER — PREDNISONE 10 MG (21) PO TBPK: ORAL_TABLET | ORAL | 0 refills | Status: DC

## 2023-04-14 MED ORDER — FULVESTRANT 250 MG/5ML IM SOSY
500.0000 mg | PREFILLED_SYRINGE | INTRAMUSCULAR | Status: DC
  Administered 2023-04-14: 500 mg via INTRAMUSCULAR
  Filled 2023-04-14: qty 10

## 2023-04-14 NOTE — Assessment & Plan Note (Signed)
She does not tolerate oral potassium.  Potassium is stable. 

## 2023-04-14 NOTE — Assessment & Plan Note (Signed)
Chronic tachycardia Stable low-normal LVEF 50-55%. Follow up with cardiology. She gets ECHO Q3 months. 

## 2023-04-14 NOTE — Assessment & Plan Note (Addendum)
This could be a side effect from her target therapy,  Refer to GI for evaluation.

## 2023-04-14 NOTE — Patient Instructions (Signed)
[facility]  Discharge Instructions: Thank you for choosing [facility] to provide your oncology and hematology care.  If you have a lab appointment with the Cancer Center, please go directly to the Cancer Center and check in at the registration area.  Wear comfortable clothing and clothing appropriate for easy access to any Portacath or PICC line.   We strive to give you quality time with your provider. You may need to reschedule your appointment if you arrive late (15 or more minutes).  Arriving late affects you and other patients whose appointments are after yours.  Also, if you miss three or more appointments without notifying the office, you may be dismissed from the clinic at the provider's discretion.      For prescription refill requests, have your pharmacy contact our office and allow 72 hours for refills to be completed.    Today you received the following chemotherapy and/or immunotherapy agents FASLODEX, ZOMETA, KANJINTI, PERJETA      To help prevent nausea and vomiting after your treatment, we encourage you to take your nausea medication as directed.  BELOW ARE SYMPTOMS THAT SHOULD BE REPORTED IMMEDIATELY: *FEVER GREATER THAN 100.4 F (38 C) OR HIGHER *CHILLS OR SWEATING *NAUSEA AND VOMITING THAT IS NOT CONTROLLED WITH YOUR NAUSEA MEDICATION *UNUSUAL SHORTNESS OF BREATH *UNUSUAL BRUISING OR BLEEDING *URINARY PROBLEMS (pain or burning when urinating, or frequent urination) *BOWEL PROBLEMS (unusual diarrhea, constipation, pain near the anus) TENDERNESS IN MOUTH AND THROAT WITH OR WITHOUT PRESENCE OF ULCERS (sore throat, sores in mouth, or a toothache) UNUSUAL RASH, SWELLING OR PAIN  UNUSUAL VAGINAL DISCHARGE OR ITCHING   Items with * indicate a potential emergency and should be followed up as soon as possible or go to the Emergency Department if any problems should occur.  Please show the CHEMOTHERAPY ALERT CARD or IMMUNOTHERAPY  ALERT CARD at check-in to the Emergency Department and triage nurse.  Should you have questions after your visit or need to cancel or reschedule your appointment, please contact [facility]  [phone] and follow the prompts.  Office hours are 8:00 a.m. to 4:30 p.m. Monday - Friday. Please note that voicemails left after 4:00 p.m. may not be returned until the following business day.  We are closed weekends and major holidays. You have access to a nurse at all times for urgent questions. Please call the main number to the clinic [phone] and follow the prompts.  For any non-urgent questions, you may also contact your provider using MyChart. We now offer e-Visits for anyone 77 and older to request care online for non-urgent symptoms. For details visit [email or web address].   Also download the MyChart app! Go to the app store, search "MyChart", open the app, select [place], and log in with your MyChart username and password.  Fulvestrant Injection What is this medication? FULVESTRANT (ful VES trant) treats breast cancer. It works by blocking the hormone estrogen in breast tissue, which prevents breast cancer cells from spreading or growing. This medicine may be used for other purposes; ask your health care provider or pharmacist if you have questions. COMMON BRAND NAME(S): FASLODEX What should I tell my care team before I take this medication? They need to know if you have any of these conditions: Bleeding disorder Liver disease Low blood cell levels, such as low white cells, red cells, and platelets An unusual or allergic reaction to fulvestrant, other medications, foods, dyes, or preservatives Pregnant or trying  to get pregnant Breast-feeding How should I use this medication? This medication is injected into a muscle. It is given by your care team in a hospital or clinic setting. Talk to your care team about the use of this medication in children.  Special care may be needed. Overdosage: If you think you have taken too much of this medicine contact a poison control center or emergency room at once. NOTE: This medicine is only for you. Do not share this medicine with others. What if I miss a dose? Keep appointments for follow-up doses. It is important not to miss your dose. Call your care team if you are unable to keep an appointment. What may interact with this medication? Certain medications that prevent or treat blood clots, such as warfarin, enoxaparin, dalteparin, apixaban, dabigatran, rivaroxaban This list may not describe all possible interactions. Give your health care provider a list of all the medicines, herbs, non-prescription drugs, or dietary supplements you use. Also tell them if you smoke, drink alcohol, or use illegal drugs. Some items may interact with your medicine. What should I watch for while using this medication? Your condition will be monitored carefully while you are receiving this medication. You may need blood work while taking this medication. Talk to your care team if you may be pregnant. Serious birth defects can occur if you take this medication during pregnancy and for 1 year after the last dose. You will need a negative pregnancy test before starting this medication. Contraception is recommended while taking this medication and for 1 year after the last dose. Your care team can help you find the option that works for you. Do not breastfeed while taking this medication and for 1 year after the last dose. This medication may cause infertility. Talk to your care team if you are concerned about your fertility. What side effects may I notice from receiving this medication? Side effects that you should report to your care team as soon as possible: Allergic reactions or angioedema--skin rash, itching or hives, swelling of the face, eyes, lips, tongue, arms, or legs, trouble swallowing or breathing Pain, tingling, or  numbness in the hands or feet Side effects that usually do not require medical attention (report to your care team if they continue or are bothersome): Bone, joint, or muscle pain Constipation Headache Hot flashes Nausea Pain, redness, or irritation at injection site Unusual weakness or fatigue This list may not describe all possible side effects. Call your doctor for medical advice about side effects. You may report side effects to FDA at 1-800-FDA-1088. Where should I keep my medication? This medication is given in a hospital or clinic. It will not be stored at home. NOTE: This sheet is a summary. It may not cover all possible information. If you have questions about this medicine, talk to your doctor, pharmacist, or health care provider.  2023 Elsevier/Gold Standard (2008-01-19 00:00:00)   Zoledronic Acid Injection (Cancer) What is this medication? ZOLEDRONIC ACID (ZOE le dron ik AS id) treats high calcium levels in the blood caused by cancer. It may also be used with chemotherapy to treat weakened bones caused by cancer. It works by slowing down the release of calcium from bones. This lowers calcium levels in your blood. It also makes your bones stronger and less likely to break (fracture). It belongs to a group of medications called bisphosphonates. This medicine may be used for other purposes; ask your health care provider or pharmacist if you have questions. COMMON BRAND NAME(S): Zometa,  Zometa Powder What should I tell my care team before I take this medication? They need to know if you have any of these conditions: Dehydration Dental disease Kidney disease Liver disease Low levels of calcium in the blood Lung or breathing disease, such as asthma Receiving steroids, such as dexamethasone or prednisone An unusual or allergic reaction to zoledronic acid, other medications, foods, dyes, or preservatives Pregnant or trying to get pregnant Breast-feeding How should I use this  medication? This medication is injected into a vein. It is given by your care team in a hospital or clinic setting. Talk to your care team about the use of this medication in children. Special care may be needed. Overdosage: If you think you have taken too much of this medicine contact a poison control center or emergency room at once. NOTE: This medicine is only for you. Do not share this medicine with others. What if I miss a dose? Keep appointments for follow-up doses. It is important not to miss your dose. Call your care team if you are unable to keep an appointment. What may interact with this medication? Certain antibiotics given by injection Diuretics, such as bumetanide, furosemide NSAIDs, medications for pain and inflammation, such as ibuprofen or naproxen Teriparatide Thalidomide This list may not describe all possible interactions. Give your health care provider a list of all the medicines, herbs, non-prescription drugs, or dietary supplements you use. Also tell them if you smoke, drink alcohol, or use illegal drugs. Some items may interact with your medicine. What should I watch for while using this medication? Visit your care team for regular checks on your progress. It may be some time before you see the benefit from this medication. Some people who take this medication have severe bone, joint, or muscle pain. This medication may also increase your risk for jaw problems or a broken thigh bone. Tell your care team right away if you have severe pain in your jaw, bones, joints, or muscles. Tell you care team if you have any pain that does not go away or that gets worse. Tell your dentist and dental surgeon that you are taking this medication. You should not have major dental surgery while on this medication. See your dentist to have a dental exam and fix any dental problems before starting this medication. Take good care of your teeth while on this medication. Make sure you see your  dentist for regular follow-up appointments. You should make sure you get enough calcium and vitamin D while you are taking this medication. Discuss the foods you eat and the vitamins you take with your care team. Check with your care team if you have severe diarrhea, nausea, and vomiting, or if you sweat a lot. The loss of too much body fluid may make it dangerous for you to take this medication. You may need bloodwork while taking this medication. Talk to your care team if you wish to become pregnant or think you might be pregnant. This medication can cause serious birth defects. What side effects may I notice from receiving this medication? Side effects that you should report to your care team as soon as possible: Allergic reactions--skin rash, itching, hives, swelling of the face, lips, tongue, or throat Kidney injury--decrease in the amount of urine, swelling of the ankles, hands, or feet Low calcium level--muscle pain or cramps, confusion, tingling, or numbness in the hands or feet Osteonecrosis of the jaw--pain, swelling, or redness in the mouth, numbness of the jaw, poor healing after  dental work, unusual discharge from the mouth, visible bones in the mouth Severe bone, joint, or muscle pain Side effects that usually do not require medical attention (report to your care team if they continue or are bothersome): Constipation Fatigue Fever Loss of appetite Nausea Stomach pain This list may not describe all possible side effects. Call your doctor for medical advice about side effects. You may report side effects to FDA at 1-800-FDA-1088. Where should I keep my medication? This medication is given in a hospital or clinic. It will not be stored at home. NOTE: This sheet is a summary. It may not cover all possible information. If you have questions about this medicine, talk to your doctor, pharmacist, or health care provider.  2023 Elsevier/Gold Standard (2008-01-19  00:00:00)    Trastuzumab Injection What is this medication? TRASTUZUMAB (tras TOO zoo mab) treats breast cancer and stomach cancer. It works by blocking a protein that causes cancer cells to grow and multiply. This helps to slow or stop the spread of cancer cells. This medicine may be used for other purposes; ask your health care provider or pharmacist if you have questions. COMMON BRAND NAME(S): Herceptin, Marlowe Alt, Ontruzant, Trazimera What should I tell my care team before I take this medication? They need to know if you have any of these conditions: Heart failure Lung disease An unusual or allergic reaction to trastuzumab, other medications, foods, dyes, or preservatives Pregnant or trying to get pregnant Breast-feeding How should I use this medication? This medication is injected into a vein. It is given by your care team in a hospital or clinic setting. Talk to your care team about the use of this medication in children. It is not approved for use in children. Overdosage: If you think you have taken too much of this medicine contact a poison control center or emergency room at once. NOTE: This medicine is only for you. Do not share this medicine with others. What if I miss a dose? Keep appointments for follow-up doses. It is important not to miss your dose. Call your care team if you are unable to keep an appointment. What may interact with this medication? Certain types of chemotherapy, such as daunorubicin, doxorubicin, epirubicin, idarubicin This list may not describe all possible interactions. Give your health care provider a list of all the medicines, herbs, non-prescription drugs, or dietary supplements you use. Also tell them if you smoke, drink alcohol, or use illegal drugs. Some items may interact with your medicine. What should I watch for while using this medication? Your condition will be monitored carefully while you are receiving this medication. This  medication may make you feel generally unwell. This is not uncommon, as chemotherapy affects healthy cells as well as cancer cells. Report any side effects. Continue your course of treatment even though you feel ill unless your care team tells you to stop. This medication may increase your risk of getting an infection. Call your care team for advice if you get a fever, chills, sore throat, or other symptoms of a cold or flu. Do not treat yourself. Try to avoid being around people who are sick. Avoid taking medications that contain aspirin, acetaminophen, ibuprofen, naproxen, or ketoprofen unless instructed by your care team. These medications can hide a fever. Talk to your care team if you may be pregnant. Serious birth defects can occur if you take this medication during pregnancy and for 7 months after the last dose. You will need a negative pregnancy test before  starting this medication. Contraception is recommended while taking this medication and for 7 months after the last dose. Your care team can help you find the option that works for you. Do not breastfeed while taking this medication and for 7 months after stopping treatment. What side effects may I notice from receiving this medication? Side effects that you should report to your care team as soon as possible: Allergic reactions or angioedema--skin rash, itching or hives, swelling of the face, eyes, lips, tongue, arms, or legs, trouble swallowing or breathing Dry cough, shortness of breath or trouble breathing Heart failure--shortness of breath, swelling of the ankles, feet, or hands, sudden weight gain, unusual weakness or fatigue Infection--fever, chills, cough, or sore throat Infusion reactions--chest pain, shortness of breath or trouble breathing, feeling faint or lightheaded Side effects that usually do not require medical attention (report to your care team if they continue or are  bothersome): Diarrhea Dizziness Headache Nausea Trouble sleeping Vomiting This list may not describe all possible side effects. Call your doctor for medical advice about side effects. You may report side effects to FDA at 1-800-FDA-1088. Where should I keep my medication? This medication is given in a hospital or clinic. It will not be stored at home. NOTE: This sheet is a summary. It may not cover all possible information. If you have questions about this medicine, talk to your doctor, pharmacist, or health care provider.  2023 Elsevier/Gold Standard (2022-03-31 00:00:00)  Pertuzumab Injection What is this medication? PERTUZUMAB (per TOOZ ue mab) treats breast cancer. It works by blocking a protein that causes cancer cells to grow and multiply. This helps to slow or stop the spread of cancer cells. It is a monoclonal antibody. This medicine may be used for other purposes; ask your health care provider or pharmacist if you have questions. COMMON BRAND NAME(S): PERJETA What should I tell my care team before I take this medication? They need to know if you have any of these conditions: Heart failure An unusual or allergic reaction to pertuzumab, other medications, foods, dyes, or preservatives Pregnant or trying to get pregnant Breast-feeding How should I use this medication? This medication is injected into a vein. It is given by your care team in a hospital or clinic setting. Talk to your care team about the use of this medication in children. Special care may be needed. Overdosage: If you think you have taken too much of this medicine contact a poison control center or emergency room at once. NOTE: This medicine is only for you. Do not share this medicine with others. What if I miss a dose? Keep appointments for follow-up doses. It is important not to miss your dose. Call your care team if you are unable to keep an appointment. What may interact with this medication? Interactions  are not expected. This list may not describe all possible interactions. Give your health care provider a list of all the medicines, herbs, non-prescription drugs, or dietary supplements you use. Also tell them if you smoke, drink alcohol, or use illegal drugs. Some items may interact with your medicine. What should I watch for while using this medication? Your condition will be monitored carefully while you are receiving this medication. This medication may make you feel generally unwell. This is not uncommon as chemotherapy can affect healthy cells as well as cancer cells. Report any side effects. Continue your course of treatment even though you feel ill unless your care team tells you to stop. Talk to your care team  if you may be pregnant. Serious birth defects can occur if you take this medication during pregnancy and for 7 months after the last dose. You will need a negative pregnancy test before starting this medication. Contraception is recommended while taking this medication and for 7 months after the last dose. Your care team can help you find the option that works for you. Do not breastfeed while taking this medication and for 7 months after the last dose. What side effects may I notice from receiving this medication? Side effects that you should report to your care team as soon as possible: Allergic reactions or angioedema--skin rash, itching or hives, swelling of the face, eyes, lips, tongue, arms, or legs, trouble swallowing or breathing Heart failure--shortness of breath, swelling of the ankles, feet, or hands, sudden weight gain, unusual weakness or fatigue Infusion reactions--chest pain, shortness of breath or trouble breathing, feeling faint or lightheaded Side effects that usually do not require medical attention (report to your care team if they continue or are bothersome): Diarrhea Dry skin Fatigue Hair loss Nausea Vomiting This list may not describe all possible side effects.  Call your doctor for medical advice about side effects. You may report side effects to FDA at 1-800-FDA-1088. Where should I keep my medication? This medication is given in a hospital or clinic. It will not be stored at home. NOTE: This sheet is a summary. It may not cover all possible information. If you have questions about this medicine, talk to your doctor, pharmacist, or health care provider.  2023 Elsevier/Gold Standard (2011-05-24 00:00:00)

## 2023-04-14 NOTE — Patient Instructions (Signed)

## 2023-04-14 NOTE — Assessment & Plan Note (Signed)
Antibody treatment plan as list above. 

## 2023-04-14 NOTE — Assessment & Plan Note (Signed)
Continue calcium supplementation 600mg TID.  normal Vitamin D level 

## 2023-04-14 NOTE — Assessment & Plan Note (Signed)
status post radiation. Continue Zometa monthly, hold if Calcium is <8.6 Continue calcium supplementation. Continue oxycodone as needed.   

## 2023-04-14 NOTE — Progress Notes (Signed)
Patient here for oncology follow-up appointment, concerns itchy rash

## 2023-04-14 NOTE — Assessment & Plan Note (Signed)
Etiology unknown.  DDX inclues allergic reaction secondary to medication, pollen, sunlight, etc.  Trials of steroid, Dex 10mg  x 1 today, Prednisone tapering course.  I advise patient to see Dermatology if no improvement.

## 2023-04-14 NOTE — Assessment & Plan Note (Addendum)
Recurrent breast cancer with isolated bone metastasis, stage IV #Initially stage IIIA right breast cancer, ER positive, HER-2 negative status post bilateral oophorectomy,Right mastectomy and right axillary lymph node dissection, status post implant, and implant removal.  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 ER positive, HER-2 positive  .- s/p Palliative radiation to T7  Labs reviewed and discussed Proceed with Kanjinti Perjecta.   Continue fulvestrant monthly. PET scan April 2024 results were reviewed with patient.   Tonsil activity is likely inflammation, refer to ENT for visualization.

## 2023-04-14 NOTE — Progress Notes (Signed)
Hematology/Oncology Progress note [phone] [phone]    REASON FOR VISIT Follow up for  treatment of breast cancer   ASSESSMENT & PLAN:   Cancer Staging  Breast cancer of upper-outer quadrant of right female breast (HCC) Staging form: Breast, AJCC 8th Edition - Clinical: G2, ER+, PR+, HER2- - Signed by [name], MD on 04/05/2018 - Pathologic stage from 04/04/2018: No Stage Recommended (ypT3, pN2, cM0, G3, ER+, PR-, HER2-) - Signed by [name], MD on 04/04/2018 - Pathologic: Stage IV (rpTX, pNX, cM1, ER+, PR-, HER2+) - Signed by [name], MD on 12/10/2020   Breast cancer of upper-outer quadrant of right female breast (HCC) Recurrent breast cancer with isolated bone metastasis, stage IV #Initially stage IIIA right breast cancer, ER positive, HER-2 negative status post bilateral oophorectomy,Right mastectomy and right axillary lymph node dissection, status post implant, and implant removal.  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 ER positive, HER-2 positive  .- s/p Palliative radiation to T7  Labs reviewed and discussed Proceed with Kanjinti Perjecta.   Continue fulvestrant monthly. PET scan April 2024 results were reviewed with patient.   Tonsil activity is likely inflammation, refer to ENT for visualization.    Nonischemic cardiomyopathy (HCC)  Chronic tachycardia Stable low-normal LVEF 50-55%. Follow up with cardiology. She gets ECHO Q3 months.  Neuropathy due to chemotherapeutic drug (HCC) continue Lyrica and nortriptyline symptoms stable  Hypocalcemia Continue calcium supplementation 600mg  TID.  normal Vitamin D level  Encounter for monoclonal antibody treatment for malignancy Antibody treatment plan as list above.  Malignant neoplasm metastatic to bone [facility]) status post radiation. Continue Zometa monthly, hold if Calcium is <8.6 Continue calcium  supplementation. Continue oxycodone as needed.    Hypokalemia She does not tolerate oral potassium.  Potassium is stable.  Skin rash Etiology unknown.  DDX inclues allergic reaction secondary to medication, pollen, sunlight, etc.  Trials of steroid, Dex 10mg  x 1 today, Prednisone tapering course.  I advise patient to see Dermatology if no improvement.   Abnormal bowel movement This could be a side effect from her target therapy,  Refer to GI for evaluation.    Orders Placed This Encounter  Procedures   Comprehensive metabolic panel    Standing Status:   Future    Standing Expiration Date:   05/25/2024   CBC with Differential    Standing Status:   Future    Standing Expiration Date:   05/25/2024   Comprehensive metabolic panel    Standing Status:   Future    Standing Expiration Date:   06/15/2024   CBC with Differential    Standing Status:   Future    Standing Expiration Date:   06/15/2024   Sedimentation rate    Standing Status:   Future    Number of Occurrences:   1    Standing Expiration Date:   04/13/2024   Ambulatory referral to ENT    Referral Priority:   Routine    Referral Type:   Consultation    Referral Reason:   Specialty Services Required    Requested Specialty:   Otolaryngology    Number of Visits Requested:   1   Ambulatory referral to Gastroenterology    Referral Priority:   Routine    Referral Type:   Consultation    Referral Reason:   Specialty Services Required    Number of Visits Requested:   1   Follow up  lab Kanjinti  Perjecta 3 weeks.  lab MD Kanjinti Perjecta 6 weeks.  fulvestrant/Zometa Q 4 weeks   All questions were answered. The patient knows to call the clinic with any problems, questions or concerns.  [name], MD, PhD [facility] Hematology Oncology 04/14/2023       Oncology History  Oncology History Overview Note        Malignant neoplasm of overlapping sites of right breast in female, estrogen receptor positive (HCC)  Breast  cancer of upper-outer quadrant of right female breast (HCC)  10/19/2017 Initial Diagnosis   Malignant neoplasm of overlapping sites of right breast in female, estrogen receptor positive (HCC)   11/08/2017 - 01/03/2018 Chemotherapy   Neoadjuvant ddAC x 4 + 1 cycle of Taxol  due to lack of response, surgery was offered.   03/19/2018 Surgery   S/p right mastectomy and right axillary dissection, immediate breast reconstruction with placement of expanders.  ypT3 ypN2, + lymphovascular invasion,  Grade 3, margin is negative, close. ER 90%, PR 0%, HER2 IHC negative.   # elective bilateral salpingo-oophorectomy..    06/11/2018 Imaging   s/p 11 cycles Taxol adjuvantly   10/10/2018 -  Radiation Therapy   adjuvant right chest wall radiation   06/03/2019 Surgery   underwent elective left prophylactic mastectomy and sentinel lymph node biopsy of left axilla Pathology negative for malignancy  # Mediport removal  # right implant removal on    07/07/2020 Imaging   MRI thoracic spine without contrast showed lesions involving the T7 posterior elements most concerning for metastatic lesion.  No evidence of epidural tumor.  Minimal thoracic spondylosis without stenosis. MRI was reviewed by me and a PET scan was obtained for further evaluation   07/20/2020 Imaging   PET scan showed hypermetabolic metastasis involving the posterior element of T7, no additional evidence of metastasis in the neck, chest, abdomen or pelvis.   07/29/2020 Procedure   T7 lesion biopsy showed metastatic carcinoma, compatible with breast origin.  Receptor status staining showed ER 71-80% positive, PR negative, HER-2 positive IHC 3+   08/31/2020 -  Radiation Therapy    finished spine radiation    09/17/2020 -  Chemotherapy   Patient is on Treatment Plan :  BREAST Trastuzumab + Pertuzumab q21d     12/10/2020 Cancer Staging   Staging form: Breast, AJCC 8th Edition - Pathologic: Stage IV (rpTX, pNX, cM1, ER+, PR-, HER2+) - Signed by  [name], MD on 12/10/2020   03/15/2021 Imaging    PET scan showed no focal hypermetabolic activity to suggest skeletal metastasis.  Mild hypermetabolic activity along the right T7-8 paraspinal musculature.  Max SUV 3.3.  Likely postprocedural-   09/28/2021 Echocardiogram   further decrease of LVEF to 45%   10/14/2021 Imaging   PET  Post bilateral mastectomy and RIGHT axillary dissection without signs of recurrent or metastatic disease.   11/12/2021 Echocardiogram   LVEF of 45-50%.   02/18/2022 Imaging   MRI thoracic and lumbar spine w wo contrast  1. Negative MRIs of the thoracic and lumbar spine. No evidence for locally recurrent metastasis at the level of T7. No other new metastatic disease elsewhere with thoracolumbar spine. 2. No significant disc pathology, stenosis, or evidence for neural impingement.    02/25/2022 Echocardiogram   LVEF 55-60%.   03/16/2022 Imaging   PET showed Stable PET-CT. No findings for local recurrent breast cancer,locoregional adenopathy or distant metastatic disease.    07/20/2022 Imaging   MRI Thoracic Spine w wo contrast 1. Interval resolution of the  increased T2 signal contrast enhancement in the posterior elements of T7. No new lesion identified. 2. No spinal canal or neural foraminal stenosis.   08/23/2022 Echocardiogram   Stable low-normal LVEF at 50-55%.   10/05/2022 Imaging   PET restaging Stable examination without evidence of hypermetabolic recurrence or metastatic disease.   Persistent minimally metabolic stranding in the posterior bilateral gluteal soft tissues may reflect sequela of subcutaneous injections   02/24/2023 Echocardiogram   Echocardiogram showed stable low normal LVEF 50-55%.      INTERVAL HISTORY 42 yo female with above oncology history reviewed by me presents for follow-up of management of metastatic Triple positive breast cancer.  Chronic back pain, unchanged.   Intermittent diarrhea, manageable.  No fever or  chills. Bilateral upper extremities skin rash, itchy, she took benadryl with no improvement.      Review of Systems  Constitutional:  Negative for chills, fever, malaise/fatigue and weight loss.  HENT:  Negative for sore throat.        Hair loss  Eyes:  Negative for redness.  Respiratory:  Negative for cough, shortness of breath and wheezing.   Cardiovascular:  Negative for chest pain, palpitations and leg swelling.  Gastrointestinal:  Negative for abdominal pain, blood in stool, heartburn, nausea and vomiting.  Genitourinary:  Negative for dysuria.  Musculoskeletal:  Positive for back pain and joint pain.  Skin:  Positive for rash.  Neurological:  Positive for tingling. Negative for dizziness and tremors.  Endo/Heme/Allergies:  Does not bruise/bleed easily.  Psychiatric/Behavioral:  Negative for hallucinations. The patient does not have insomnia.     No Known Allergies  Patient Active Problem List   Diagnosis Date Noted   Breast cancer of upper-outer quadrant of right female breast (HCC) 03/19/2018    Priority: High   Skin rash 04/14/2023    Priority: Medium    Hypokalemia 06/03/2022    Priority: Medium    Malignant neoplasm metastatic to bone (HCC) 03/25/2021    Priority: Medium    Encounter for monoclonal antibody treatment for malignancy 12/10/2020    Priority: Medium    Hypocalcemia 11/19/2020    Priority: Medium    Neuropathy due to chemotherapeutic drug (HCC) 08/11/2020    Priority: Medium    Rheumatoid arthritis (HCC) 10/13/2019    Priority: Medium    Nonischemic cardiomyopathy (HCC) 08/23/2018    Priority: Medium    Right shoulder pain 01/20/2023    Priority: Low   Thyroid nodule 06/04/2022    Priority: Low   Encounter for antineoplastic chemotherapy 10/29/2020    Priority: Low   Goals of care, counseling/discussion 08/11/2020    Priority: Low   Gastroesophageal reflux disease without esophagitis 02/24/2017    Priority: Low   Tachycardia, unspecified  04/15/2021   Bone lesion 11/19/2020   Inflammatory arthritis 11/19/2020   HER2-positive carcinoma of breast (HCC) 08/11/2020   Bilateral hand swelling 10/03/2019   Fracture of neck of metacarpal bone 05/14/2019   Chronic fatigue 04/09/2019   Polyarthralgia 04/09/2019   Status post right breast reconstruction 02/26/2019   Status post right mastectomy 02/26/2019   Mastalgia 02/15/2019   Shortness of breath 08/23/2018   Preprocedural cardiovascular examination 08/23/2018   Tachycardia 08/23/2018   Palpitations 08/23/2018   Estrogen receptor positive status (ER+) 04/04/2018   Acquired absence of right breast and nipple 04/03/2018   Family history of cancer    Malignant neoplasm of overlapping sites of right breast in female, estrogen receptor positive (HCC) 10/19/2017   Generalized anxiety disorder 10/03/2014  Headache 10/03/2014     Past Medical History:  Diagnosis Date   Anemia    Arthritis    BRCA negative 11/26/2017   Breast cancer (HCC) 10/11/2017   Multifocal, ER positive, PR negative, HER-2 negative. ypT3 ypN2a 8.7 cm, 4/15 nodes   Cardiomyopathy (HCC)    a. 10/2017 Echo: EF 60-65%, no rwma, Gr1 DD, nl RV size/fxn; b. 04/2018 Echo: EF 55-60%, no rwma, Nl RV size/fxn; c. 08/2018 Echo: EF 45%, diff HK, ? HK of antsept wall. Gr1 DD. Mild MR. Mild LAE/RAE. Mod dil RV.    Chronic bronchitis (HCC) 11/2017   COPD (chronic obstructive pulmonary disease) (HCC)    MILD PER CXR   Depression    Family history of cancer    GERD (gastroesophageal reflux disease)    Headache    MIGRAINES   Heart murmur    ASYMPTOMATIC   Personal history of chemotherapy    current for right breast ca     Past Surgical History:  Procedure Laterality Date   AXILLARY LYMPH NODE DISSECTION Right 03/19/2018   Procedure: AXILLARY LYMPH NODE DISSECTION;  Surgeon: Earline Mayotte, MD;  Location: [facility];  Service: General;  Laterality: Right;   BREAST BIOPSY Right 10/11/2017   12:30 posterior coil  clip invasive mammary carcinoma   BREAST BIOPSY Right 10/11/2017   11:30 middle depth ribbon clip DCIS   BREAST BIOPSY Right 10/11/2017   5:30 anterior depth x shape invasive ductal carcinoma   BREAST IMPLANT REMOVAL Right 06/03/2019   Procedure: REMOVAL OF RIGHT BREAST IMPLANTS;  Surgeon: Peggye Form, DO;  Location: [facility];  Service: Plastics;  Laterality: Right;   BREAST RECONSTRUCTION WITH PLACEMENT OF TISSUE EXPANDER AND FLEX HD (ACELLULAR HYDRATED DERMIS) Right 03/19/2018   Procedure: BREAST RECONSTRUCTION WITH PLACEMENT OF TISSUE EXPANDER AND FLEX HD (ACELLULAR HYDRATED DERMIS);  Surgeon: Peggye Form, DO;  Location: [facility];  Service: Plastics;  Laterality: Right;   CARPAL TUNNEL RELEASE Bilateral 2020   CHOLECYSTECTOMY N/A 04/27/2020   Procedure: LAPAROSCOPIC CHOLECYSTECTOMY WITH INTRAOPERATIVE CHOLANGIOGRAM;  Surgeon: Earline Mayotte, MD;  Location: [facility];  Service: General;  Laterality: N/A;   ESOPHAGOGASTRODUODENOSCOPY (EGD) WITH PROPOFOL N/A 04/17/2020   Procedure: ESOPHAGOGASTRODUODENOSCOPY (EGD) WITH PROPOFOL;  Surgeon: Earline Mayotte, MD;  Location: [facility];  Service: Endoscopy;  Laterality: N/A;  with biopsy   LAPAROSCOPIC BILATERAL SALPINGO OOPHERECTOMY Bilateral 03/19/2018   Procedure: LAPAROSCOPIC BILATERAL SALPINGO OOPHORECTOMY;  Surgeon: Christeen Douglas, MD;  Location: [facility];  Service: Gynecology;  Laterality: Bilateral;   MASTECTOMY Right 03/2018   MASTECTOMY W/ SENTINEL NODE BIOPSY Right 03/19/2018   Procedure: MASTECTOMY WITH SENTINEL LYMPH NODE BIOPSY;  Surgeon: Earline Mayotte, MD;  Location: [facility];  Service: General;  Laterality: Right;   PORT-A-CATH REMOVAL Left 06/03/2019   Procedure: REMOVAL PORT-A-CATH;  Surgeon: Earline Mayotte, MD;  Location: [facility];  Service: General;  Laterality: Left;   PORTACATH PLACEMENT Left 10/24/2017   Procedure: INSERTION PORT-A-CATH;  Surgeon: Earline Mayotte, MD;  Location: [facility];   Service: General;  Laterality: Left;   PORTACATH PLACEMENT Right 09/28/2020   Procedure: INSERTION PORT-A-CATH;  Surgeon: Earline Mayotte, MD;  Location: [facility];  Service: General;  Laterality: Right;   REMOVAL OF TISSUE EXPANDER AND PLACEMENT OF IMPLANT Right 07/20/2018   Procedure: REMOVAL OF RIGHT BREAST TISSUE EXPANDER AND PLACEMENT OF IMPLANT;  Surgeon: Peggye Form, DO;  Location: [facility];  Service: Plastics;  Laterality: Right;   SIMPLE MASTECTOMY  WITH AXILLARY SENTINEL NODE BIOPSY Left 06/03/2019   Procedure: SIMPLE MASTECTOMY LEFT;  Surgeon: Earline Mayotte, MD;  Location: [facility];  Service: General;  Laterality: Left;    Social History   Socioeconomic History   Marital status: Married    Spouse name: Not on file   Number of children: Not on file   Years of education: Not on file   Highest education level: Not on file  Occupational History   Occupation: pharmacy tech    Comment: [employer]up[employer]urist [employer]   Tobacco Use   Smoking status: Former    Packs/day: 0.50    Years: 18.00    Additional pack years: 0.00    Total pack years: 9.00    Types: Cigarettes    Start date: 06/21/2018    Quit date: 12/11/2022    Years since quitting: 0.3   Smokeless tobacco: Never  Vaping Use   Vaping Use: Never used  Substance and Sexual Activity   Alcohol use: No   Drug use: No   Sexual activity: Yes    Birth control/protection: Injection, Other-see comments    Comment: has had hysterectomy  Other Topics Concern   Not on file  Social History Narrative   Lives at home with husband and daughter   Social Determinants of Health   Financial Resource Strain: Not on file  Food Insecurity: Not on file  Transportation Needs: Not on file  Physical Activity: Not on file  Stress: Not on file  Social Connections: Not on file  Intimate Partner Violence: Not on file     Family History  Problem Relation Age of Onset   Diabetes Father     Hypertension Father    Hyperlipidemia Father    Heart attack Father 73       "mild"   Melanoma Maternal Aunt        other aunts with BCC/SCC/Melanoma   Cervical cancer Maternal Aunt 51       daughter w/ cervical cancer as well   Lung cancer Maternal Aunt    Melanoma Maternal Uncle        other uncles with BCC/SCC/Melanoma   Breast cancer Paternal Aunt    Bladder Cancer Maternal Grandmother   Biological mother had Grave's disease.    Current Outpatient Medications:    acetaminophen (TYLENOL) 500 MG tablet, Take 500 mg by mouth every 6 (six) hours as needed., Disp: , Rfl:    albuterol (VENTOLIN HFA) 108 (90 Base) MCG/ACT inhaler, Inhale 2 puffs into the lungs every 6 (six) hours as needed for wheezing or shortness of breath. , Disp: , Rfl:    CALCIUM CARBONATE-VITAMIN D PO, Take 600-800 mg by mouth in the morning, at noon, and at bedtime., Disp: , Rfl:    chlorhexidine (PERIDEX) 0.12 % solution, Use as directed 15 mLs in the mouth or throat 2 (two) times daily., Disp: 120 mL, Rfl: 0   cyanocobalamin (,VITAMIN B-12,) 1000 MCG/ML injection, Inject 1,000 mcg into the skin every 30 (thirty) days., Disp: , Rfl:    cyclobenzaprine (FLEXERIL) 5 MG tablet, Take 5 mg by mouth 3 (three) times daily as needed for muscle spasms., Disp: , Rfl:    diphenoxylate-atropine (LOMOTIL) 2.5-0.025 MG tablet, Take 1 tablet by mouth 4 (four) times daily as needed for diarrhea or loose stools., Disp: 60 tablet, Rfl: 0   escitalopram (LEXAPRO) 20 MG tablet, Take 20 mg by mouth daily., Disp: , Rfl:    esomeprazole (NEXIUM) 40 MG capsule, Take  40 mg by mouth daily before breakfast. , Disp: , Rfl:    Eszopiclone 3 MG TABS, Take 3 mg by mouth at bedtime as needed (sleep). , Disp: , Rfl:    folic acid (FOLVITE) 1 MG tablet, Take 1 mg by mouth daily., Disp: , Rfl:    hydroxychloroquine (PLAQUENIL) 200 MG tablet, Take 200 mg by mouth daily., Disp: , Rfl:    ibuprofen (ADVIL) 800 MG tablet, Take 800 mg by mouth every 8  (eight) hours as needed for moderate pain., Disp: , Rfl:    loperamide (IMODIUM) 2 MG capsule, Take 1 tablet (2 mg total) by mouth See admin instructions. Take 2 tablets with onset of diarrhea, then take 1 tablet every 2 hours until diarrhea stops. Maximum 8 tablets in 24, Disp: 90 capsule, Rfl: 0   loratadine (CLARITIN) 10 MG tablet, Take 10 mg by mouth daily. , Disp: , Rfl:    LORazepam (ATIVAN) 1 MG tablet, Take 1 mg by mouth 3 (three) times daily., Disp: , Rfl:    Magnesium 500 MG TABS, Take 500 mg by mouth 2 (two) times daily., Disp: , Rfl:    methocarbamol (ROBAXIN) 500 MG tablet, Take by mouth., Disp: , Rfl:    methotrexate (RHEUMATREX) 2.5 MG tablet, Take 25 mg by mouth every Sunday. 10 tablets once a week, Disp: , Rfl:    metoprolol tartrate (LOPRESSOR) 25 MG tablet, Take 0.5 tablets (12.5 mg total) by mouth as needed., Disp: 30 tablet, Rfl: 0   mupirocin ointment (BACTROBAN) 2 %, Place 1 application into the nose 2 (two) times daily. Use in each nostril twice daily for five (5) days., Disp: 22 g, Rfl: 5   nortriptyline (PAMELOR) 10 MG capsule, Take 30 mg by mouth at bedtime. , Disp: , Rfl:    oxyCODONE (OXY IR/ROXICODONE) 5 MG immediate release tablet, Take 1 tablet (5 mg total) by mouth every 6 (six) hours as needed for moderate pain or severe pain., Disp: 90 tablet, Rfl: 0   phentermine (ADIPEX-P) 37.5 MG tablet, Take 37.5 mg by mouth daily before breakfast. , Disp: , Rfl:    predniSONE (STERAPRED UNI-PAK 21 TAB) 10 MG (21) TBPK tablet, Day 1 take 6 tablets, Day 2 take 5 tablets, Day 3 take 4 tablets, Day 4 take 3 tablets, Day 5 take 2 tablets D6 take 1 tablet, Disp: 1 each, Rfl: 0   pregabalin (LYRICA) 150 MG capsule, Take 150 mg by mouth 2 (two) times daily., Disp: , Rfl:    promethazine (PHENERGAN) 25 MG tablet, Take 1 tablet (25 mg total) by mouth every 8 (eight) hours as needed for nausea or vomiting., Disp: 90 tablet, Rfl: 0   pyridOXINE (VITAMIN B-6) 100 MG tablet, Take 100 mg by  mouth daily., Disp: , Rfl:    topiramate (TOPAMAX) 50 MG tablet, Take 50 mg by mouth 2 (two) times daily. , Disp: , Rfl:    vitamin C (ASCORBIC ACID) 500 MG tablet, Take 500 mg by mouth daily., Disp: , Rfl:  No current facility-administered medications for this visit.  Facility-Administered Medications Ordered in Other Visits:    fulvestrant (FASLODEX) injection 500 mg, 500 mg, Intramuscular, Q30 days, [name], MD, 500 mg at 04/14/23 1130   heparin lock flush 100 unit/mL, 500 Units, Intravenous, Once, [name], MD   heparin lock flush 100 unit/mL, 500 Units, Intracatheter, Once PRN, [name], MD   sodium chloride flush (NS) 0.9 % injection 10 mL, 10 mL, Intravenous, PRN, [name], MD, 10  mL at 11/19/18 1249   sodium chloride flush (NS) 0.9 % injection 10 mL, 10 mL, Intravenous, Once, [name], MD   Physical exam:  Vitals:   04/14/23 1001  BP: 106/69  Pulse: (!) 102  Resp: 18  Temp: (!) 96.8 F (36 C)  TempSrc: Tympanic  SpO2: 100%  Weight: 185 lb (83.9 kg)  ECOG 1 Physical Exam Constitutional:      Appearance: Normal appearance.  HENT:     Head: Normocephalic and atraumatic.  Eyes:     General: No scleral icterus. Neck:     Vascular: No JVD.  Cardiovascular:     Rate and Rhythm: Normal rate.  Pulmonary:     Effort: Pulmonary effort is normal. No respiratory distress.     Breath sounds: Normal breath sounds. No wheezing.  Abdominal:     General: There is no distension.     Palpations: Abdomen is soft.  Musculoskeletal:     Cervical back: Normal range of motion.     Comments: Limited right shoulder range of moton due to pain  Lymphadenopathy:     Cervical: No cervical adenopathy.  Skin:    General: Skin is warm.     Findings: No erythema or rash.  Neurological:     Mental Status: She is alert and oriented to person, place, and time. Mental status is at baseline.     Cranial Nerves: No cranial nerve deficit.     Motor: No abnormal muscle tone.  Psychiatric:         Mood and Affect: Mood and affect normal.        Labs     Latest Ref Rng & Units 04/14/2023    9:39 AM  CMP  Glucose 70 - 99 mg/dL 161   BUN 6 - 20 mg/dL 20   Creatinine 0.96 - 1.00 mg/dL 0.45   Sodium 409 - 811 mmol/L 139   Potassium 3.5 - 5.1 mmol/L 3.4   Chloride 98 - 111 mmol/L 110   CO2 22 - 32 mmol/L 24   Calcium 8.9 - 10.3 mg/dL 8.7   Total Protein 6.5 - 8.1 g/dL 7.1   Total Bilirubin 0.3 - 1.2 mg/dL 0.2   Alkaline Phos 38 - 126 U/L 60   AST 15 - 41 U/L 30   ALT 0 - 44 U/L 18       Latest Ref Rng & Units 04/14/2023    9:39 AM  CBC  WBC 4.0 - 10.5 K/uL 5.6   Hemoglobin 12.0 - 15.0 g/dL 91.4   Hematocrit 78.2 - 46.0 % 32.1   Platelets 150 - 400 K/uL 285    RADIOGRAPHIC STUDIES: I have personally reviewed the radiological images as listed and agreed with the findings in the report. NM PET Image Restage (PS) Skull Base to Thigh (F-18 FDG)  Result Date: 04/09/2023 CLINICAL DATA:  Subsequent treatment strategy for breast cancer. EXAM: NUCLEAR MEDICINE PET SKULL BASE TO THIGH TECHNIQUE: 10.04 mCi F-18 FDG was injected intravenously. Full-ring PET imaging was performed from the skull base to thigh after the radiotracer. CT data was obtained and used for attenuation correction and anatomic localization. Fasting blood glucose: 84 mg/dl COMPARISON:  Multiple priors including most recent PET-CT October 05, 2022 FINDINGS: Mediastinal blood pool activity: SUV max 1.9 Liver activity: SUV max NA NECK: Asymmetric right-sided tonsillar FDG avidity max SUV of 6.0. Incidental CT findings: None. CHEST: No hypermetabolic thoracic adenopathy. No hypermetabolic pulmonary nodules or masses. Status post bilateral mastectomies without hypermetabolic  chest wall lesion Incidental CT findings: Right chest wall Port-A-Cath with tip in the right atrium. ABDOMEN/PELVIS: No abnormal hypermetabolic activity within the liver, pancreas, adrenal glands, or spleen. No hypermetabolic lymph nodes in the abdomen or  pelvis. Similar nodular stranding in the posterior gluteal subcutaneous soft tissues with low-level FDG avidity max SUV of 2.2. Incidental CT findings: Multiple small hyperdense foci in the cecum likely reflects ingested material. Gas fluid levels in the proximal colon. SKELETON: No focal hypermetabolic activity to suggest skeletal metastasis. Incidental CT findings: None. IMPRESSION: 1. Status post bilateral mastectomies without evidence of hypermetabolic local recurrence. 2. No evidence of hypermetabolic metastatic disease. 3. Asymmetric right-sided tonsillar FDG avidity is nonspecific consider further evaluation with direct visualization. 4. Gas fluid levels in the proximal colon may reflect a diarrheal illness or laxative use. Electronically Signed   By: Maudry Mayhew M.D.   On: 04/09/2023 09:50   ECHOCARDIOGRAM LIMITED  Result Date: 02/24/2023    ECHOCARDIOGRAM LIMITED REPORT   Patient Name:   Theresa Chaney Date of Exam: 02/24/2023 Medical Rec #:  [id number]      Height:       67.0 in Accession #:    [id number]     Weight:       186.9 lb Date of Birth:  1981/01/01      BSA:          1.965 m Patient Age:    42 years       BP:           123/75 mmHg Patient Gender: F              HR:           94 bpm. Exam Location:  [facility] Procedure: 3D Echo, Limited Echo, Limited Color Doppler and Strain Analysis Indications:    I42.80 Non-ischemic cardiomyopathy  History:        Patient has prior history of Echocardiogram examinations, most                 recent 11/25/2022. Cardiomyopathy, COPD, Arrythmias:Tachycardia,                 Signs/Symptoms:Murmur; Risk Factors:Former Smoker and h/o                 Chemotherapy.  Sonographer:    Quentin Ore RDMS, RVT, RDCS Referring Phys: [id number] Raymon Mutton DUNN  Sonographer Comments: New equipment c/t previous echo allowed for good quality DHM and GLS IMPRESSIONS  1. Left ventricular ejection fraction, by estimation, is 50 to 55%. Left ventricular ejection fraction by 3D volume is  52 %. The left ventricle has low normal function. The left ventricle has no regional wall motion abnormalities. Left ventricular diastolic parameters were normal. The average left ventricular global longitudinal strain is -18.7 %.  2. Right ventricular systolic function is normal. The right ventricular size is normal.  3. The mitral valve is normal in structure. No evidence of mitral valve regurgitation. No evidence of mitral stenosis.  4. The aortic valve has an indeterminant number of cusps. Aortic valve regurgitation is not visualized. No aortic stenosis is present.  5. The inferior vena cava is normal in size with greater than 50% respiratory variability, suggesting right atrial pressure of 3 mmHg. Comparison(s): Suboptimal GLS on previous exam. FINDINGS  Left Ventricle: Left ventricular ejection fraction, by estimation, is 50 to 55%. Left ventricular ejection fraction by 3D volume is 52 %. The left ventricle has low normal function. The left  ventricle has no regional wall motion abnormalities. The average left ventricular global longitudinal strain is -18.7 %. The left ventricular internal cavity size was normal in size. There is no left ventricular hypertrophy. Left ventricular diastolic parameters were normal. Right Ventricle: The right ventricular size is normal. No increase in right ventricular wall thickness. Right ventricular systolic function is normal. Left Atrium: Left atrial size was normal in size. Right Atrium: Right atrial size was normal in size. Pericardium: There is no evidence of pericardial effusion. Mitral Valve: The mitral valve is normal in structure. No evidence of mitral valve stenosis. Tricuspid Valve: The tricuspid valve is normal in structure. Tricuspid valve regurgitation is not demonstrated. No evidence of tricuspid stenosis. Aortic Valve: The aortic valve has an indeterminant number of cusps. Aortic valve regurgitation is not visualized. No aortic stenosis is present. Pulmonic  Valve: The pulmonic valve was normal in structure. Pulmonic valve regurgitation is not visualized. No evidence of pulmonic stenosis. Aorta: The aortic root is normal in size and structure. Venous: The inferior vena cava is normal in size with greater than 50% respiratory variability, suggesting right atrial pressure of 3 mmHg. IAS/Shunts: No atrial level shunt detected by color flow Doppler. Additional Comments: Spectral Doppler performed. Color Doppler performed.  LEFT VENTRICLE PLAX 2D LVIDd:         5.00 cm         Diastology LVIDs:         3.60 cm         LV e' medial:    7.72 cm/s LV PW:         0.80 cm         LV E/e' medial:  13.5 LV IVS:        0.90 cm         LV e' lateral:   13.10 cm/s                                LV E/e' lateral: 7.9                                 2D                                Longitudinal                                Strain                                2D Strain GLS  -18.7 %                                Avg:                                 3D Volume EF                                LV 3D EF:    Left  ventricul                                             ar                                             ejection                                             fraction                                             by 3D                                             volume is                                             52 %.                                 3D Volume EF:                                3D EF:        52 %                                LV EDV:       114 ml                                LV ESV:       55 ml                                LV SV:        60 ml RIGHT VENTRICLE RV S prime:     14.30 cm/s TAPSE (M-mode): 2.1 cm MITRAL VALVE MV Area (PHT): 5.20 cm MV Decel Time: 146 msec MV E velocity: 104.00 cm/s MV A velocity: 48.20 cm/s MV E/A ratio:  2.16 Julien Nordmann MD Electronically signed by Julien Nordmann MD Signature  Date/Time: 02/24/2023/2:36:22 PM    Final    DG Shoulder Right  Result Date: 01/21/2023 CLINICAL DATA:  Pain EXAM: RIGHT SHOULDER - 3 VIEW COMPARISON:  None Available. FINDINGS: There is no evidence of fracture or dislocation. There is no evidence of arthropathy or other focal bone abnormality. Soft tissues are unremarkable. IMPRESSION: Negative. Electronically Signed   By: Layla Maw M.D.   On: 01/21/2023 00:49

## 2023-04-14 NOTE — Assessment & Plan Note (Signed)
continue Lyrica and nortriptyline symptoms stable 

## 2023-04-19 ENCOUNTER — Telehealth: Payer: Self-pay

## 2023-04-19 NOTE — Telephone Encounter (Signed)
-----   Message from Reggy Eye sent at 04/19/2023  3:02 PM EDT ----- Regarding: APPOINTMENT SCHEDULED- [facility] APPOINTMENT SCHEDULEDRandell Chaney ENT set to her chart.  Appt is 05/29/2023 @1 :45 pm w/ Dr Willeen Cass.

## 2023-04-24 ENCOUNTER — Encounter: Payer: Self-pay | Admitting: Oncology

## 2023-04-25 ENCOUNTER — Other Ambulatory Visit: Payer: Self-pay | Admitting: Oncology

## 2023-04-25 NOTE — Progress Notes (Unsigned)
Celso Amy, PA-C [address]  [address]  [place], [place] [address]  Main: [phone]  Fax: [phone]   Gastroenterology Consultation  Referring Provider:     [name], MD Primary Care Physician:  Darreld Mclean at [facility] Primary Gastroenterologist:  Celso Amy, PA-C / Dr. Wyline Mood  Reason for Consultation:    Anemia, Schedule EGD & Colonoscopy; Diarrhea        HPI:   Theresa Chaney is a 42 y.o. y/o female referred for consultation & management  by Default, Provider, MD.    History of Right Breast Cancer diagnosed in 2018 with recurrence and isolated bone metastasis to spine.  Treated with Surgery, Mastectomy, Chemo and Radiation, Followed by Dr. Cathie Hoops.  Currently on Chemo.  Labs 03/03/23 showed Hgb 11.1, Hct 33, MCV 11.1. Labs 03/24/23 showed Hgb 9.7. Labs 04/14/23 showed Hgb 10.5, Hct 32, MCV 89, Plt 285. No Recent Iron or B12 Labs.  Hx Chronic Anemia (Hgb was 11.1g in 2021). She takes B12 and folic acid.  Not on iron.  Patient states that she is having very irregular bowel habits and change in bowel habits for few months.  She has diarrhea alternating with normal stools alternating with small hard stools.  Occasional bilateral lower abdominal cramping which comes and goes.  No abdominal pain today.  She denies rectal bleeding, melena, or unintentional weight loss.  She admits to abdominal bloating and increased nausea after eating.  She takes Nexium 40 mg once daily with fair control of acid reflux.  She reports breakthrough heartburn.  Denies dysphagia.  Recent PET Scan 04/09/23 showed No evidence of hypermetabolic metastatic disease.  There was gas fluid levels in the proximal colon, reflective of diarrhea.  No hypermetabolic activitiy in the abdomen or pelvis.  NO previous Colonoscopy.  No family history of colon cancer.  Both her father and sister had colon polyps.  Pt. Had EGD done 04/2020 by Dr. Lemar Livings which shoed gastritis and a small amount of food in the  stomach.  Normal Esophagus and Duodenum.  Gastric biopsies showed focal intestinal metaplasia.  Negative for H. Pylori.  Past Medical History:  Diagnosis Date   Anemia    Arthritis    BRCA negative 11/26/2017   Breast cancer (HCC) 10/11/2017   Multifocal, ER positive, PR negative, HER-2 negative. ypT3 ypN2a 8.7 cm, 4/15 nodes   Cardiomyopathy (HCC)    a. 10/2017 Echo: EF 60-65%, no rwma, Gr1 DD, nl RV size/fxn; b. 04/2018 Echo: EF 55-60%, no rwma, Nl RV size/fxn; c. 08/2018 Echo: EF 45%, diff HK, ? HK of antsept wall. Gr1 DD. Mild MR. Mild LAE/RAE. Mod dil RV.    Chronic bronchitis (HCC) 11/2017   COPD (chronic obstructive pulmonary disease) (HCC)    MILD PER CXR   Depression    Family history of cancer    GERD (gastroesophageal reflux disease)    Headache    MIGRAINES   Heart murmur    ASYMPTOMATIC   Personal history of chemotherapy    current for right breast ca    Past Surgical History:  Procedure Laterality Date   AXILLARY LYMPH NODE DISSECTION Right 03/19/2018   Procedure: AXILLARY LYMPH NODE DISSECTION;  Surgeon: Earline Mayotte, MD;  Location: [facility];  Service: General;  Laterality: Right;   BREAST BIOPSY Right 10/11/2017   12:30 posterior coil clip invasive mammary carcinoma   BREAST BIOPSY Right 10/11/2017   11:30 middle depth ribbon clip DCIS   BREAST BIOPSY Right  10/11/2017   5:30 anterior depth x shape invasive ductal carcinoma   BREAST IMPLANT REMOVAL Right 06/03/2019   Procedure: REMOVAL OF RIGHT BREAST IMPLANTS;  Surgeon: Peggye Form, DO;  Location: [facility];  Service: Plastics;  Laterality: Right;   BREAST RECONSTRUCTION WITH PLACEMENT OF TISSUE EXPANDER AND FLEX HD (ACELLULAR HYDRATED DERMIS) Right 03/19/2018   Procedure: BREAST RECONSTRUCTION WITH PLACEMENT OF TISSUE EXPANDER AND FLEX HD (ACELLULAR HYDRATED DERMIS);  Surgeon: Peggye Form, DO;  Location: [facility];  Service: Plastics;  Laterality: Right;   CARPAL TUNNEL RELEASE Bilateral 2020    CHOLECYSTECTOMY N/A 04/27/2020   Procedure: LAPAROSCOPIC CHOLECYSTECTOMY WITH INTRAOPERATIVE CHOLANGIOGRAM;  Surgeon: Earline Mayotte, MD;  Location: [facility];  Service: General;  Laterality: N/A;   ESOPHAGOGASTRODUODENOSCOPY (EGD) WITH PROPOFOL N/A 04/17/2020   Procedure: ESOPHAGOGASTRODUODENOSCOPY (EGD) WITH PROPOFOL;  Surgeon: Earline Mayotte, MD;  Location: [facility];  Service: Endoscopy;  Laterality: N/A;  with biopsy   LAPAROSCOPIC BILATERAL SALPINGO OOPHERECTOMY Bilateral 03/19/2018   Procedure: LAPAROSCOPIC BILATERAL SALPINGO OOPHORECTOMY;  Surgeon: Christeen Douglas, MD;  Location: [facility];  Service: Gynecology;  Laterality: Bilateral;   MASTECTOMY Right 03/2018   MASTECTOMY W/ SENTINEL NODE BIOPSY Right 03/19/2018   Procedure: MASTECTOMY WITH SENTINEL LYMPH NODE BIOPSY;  Surgeon: Earline Mayotte, MD;  Location: [facility];  Service: General;  Laterality: Right;   PORT-A-CATH REMOVAL Left 06/03/2019   Procedure: REMOVAL PORT-A-CATH;  Surgeon: Earline Mayotte, MD;  Location: [facility];  Service: General;  Laterality: Left;   PORTACATH PLACEMENT Left 10/24/2017   Procedure: INSERTION PORT-A-CATH;  Surgeon: Earline Mayotte, MD;  Location: [facility];  Service: General;  Laterality: Left;   PORTACATH PLACEMENT Right 09/28/2020   Procedure: INSERTION PORT-A-CATH;  Surgeon: Earline Mayotte, MD;  Location: [facility];  Service: General;  Laterality: Right;   REMOVAL OF TISSUE EXPANDER AND PLACEMENT OF IMPLANT Right 07/20/2018   Procedure: REMOVAL OF RIGHT BREAST TISSUE EXPANDER AND PLACEMENT OF IMPLANT;  Surgeon: Peggye Form, DO;  Location: [facility];  Service: Plastics;  Laterality: Right;   SIMPLE MASTECTOMY WITH AXILLARY SENTINEL NODE BIOPSY Left 06/03/2019   Procedure: SIMPLE MASTECTOMY LEFT;  Surgeon: Earline Mayotte, MD;  Location: [facility];  Service: General;  Laterality: Left;    Prior to Admission medications   Medication Sig Start Date End Date  Taking? Authorizing Provider  acetaminophen (TYLENOL) 500 MG tablet Take 500 mg by mouth every 6 (six) hours as needed.    [provider]  albuterol (VENTOLIN HFA) 108 (90 Base) MCG/ACT inhaler Inhale 2 puffs into the lungs every 6 (six) hours as needed for wheezing or shortness of breath.     [provider]  CALCIUM CARBONATE-VITAMIN D PO Take 600-800 mg by mouth in the morning, at noon, and at bedtime.    [provider]  chlorhexidine (PERIDEX) 0.12 % solution Use as directed 15 mLs in the mouth or throat 2 (two) times daily. 05/27/22   [name], MD  cyanocobalamin (,VITAMIN B-12,) 1000 MCG/ML injection Inject 1,000 mcg into the skin every 30 (thirty) days. 09/02/19   [provider]  cyclobenzaprine (FLEXERIL) 5 MG tablet Take 5 mg by mouth 3 (three) times daily as needed for muscle spasms. 08/06/19   [provider]  diphenoxylate-atropine (LOMOTIL) 2.5-0.025 MG tablet Take 1 tablet by mouth 4 (four) times daily as needed for diarrhea or loose stools. 10/28/20   [name], MD  escitalopram (LEXAPRO) 20 MG tablet Take 20 mg  by mouth daily.    [provider]  esomeprazole (NEXIUM) 40 MG capsule Take 40 mg by mouth daily before breakfast.     [provider]  Eszopiclone 3 MG TABS Take 3 mg by mouth at bedtime as needed (sleep).  08/21/20   [provider]  folic acid (FOLVITE) 1 MG tablet Take 1 mg by mouth daily. 09/02/19   [provider]  hydroxychloroquine (PLAQUENIL) 200 MG tablet Take 200 mg by mouth daily. 06/08/21   [provider]  ibuprofen (ADVIL) 800 MG tablet Take 800 mg by mouth every 8 (eight) hours as needed for moderate pain.    [provider]  loperamide (IMODIUM) 2 MG capsule Take 1 tablet (2 mg total) by mouth See admin instructions. Take 2 tablets with onset of diarrhea, then take 1 tablet every 2 hours until diarrhea stops. Maximum 8 tablets in 24 08/19/22   [name], MD  loratadine  (CLARITIN) 10 MG tablet Take 10 mg by mouth daily.     [provider]  LORazepam (ATIVAN) 1 MG tablet Take 1 mg by mouth 3 (three) times daily.    [provider]  Magnesium 500 MG TABS Take 500 mg by mouth 2 (two) times daily.    [provider]  methocarbamol (ROBAXIN) 500 MG tablet Take by mouth. 01/10/23   [provider]  methotrexate (RHEUMATREX) 2.5 MG tablet Take 25 mg by mouth every Sunday. 10 tablets once a week 09/02/19   [provider]  metoprolol tartrate (LOPRESSOR) 25 MG tablet Take 0.5 tablets (12.5 mg total) by mouth as needed. 02/24/23   Dunn, Raymon Mutton, PA-C  mupirocin ointment (BACTROBAN) 2 % Place 1 application into the nose 2 (two) times daily. Use in each nostril twice daily for five (5) days. 05/27/21   [name], MD  nortriptyline (PAMELOR) 10 MG capsule Take 30 mg by mouth at bedtime.     [provider]  oxyCODONE (OXY IR/ROXICODONE) 5 MG immediate release tablet Take 1 tablet (5 mg total) by mouth every 6 (six) hours as needed for moderate pain or severe pain. 07/22/22   [name], MD  phentermine (ADIPEX-P) 37.5 MG tablet Take 37.5 mg by mouth daily before breakfast.  09/17/19   [provider]  predniSONE (STERAPRED UNI-PAK 21 TAB) 10 MG (21) TBPK tablet Day 1 take 6 tablets, Day 2 take 5 tablets, Day 3 take 4 tablets, Day 4 take 3 tablets, Day 5 take 2 tablets D6 take 1 tablet 04/14/23   [name], MD  pregabalin (LYRICA) 150 MG capsule Take 150 mg by mouth 2 (two) times daily. 07/27/20   [provider]  promethazine (PHENERGAN) 25 MG tablet Take 1 tablet (25 mg total) by mouth every 8 (eight) hours as needed for nausea or vomiting. 02/15/22   [name], MD  pyridOXINE (VITAMIN B-6) 100 MG tablet Take 100 mg by mouth daily.    [provider]  topiramate (TOPAMAX) 50 MG tablet Take 50 mg by mouth 2 (two) times daily.  11/06/19   [provider]  vitamin C (ASCORBIC ACID) 500 MG tablet Take 500 mg  by mouth daily.    [provider]    Family History  Problem Relation Age of Onset   Diabetes Father    Hypertension Father    Hyperlipidemia Father    Heart attack Father 34       "mild"   Melanoma Maternal Aunt  other aunts with BCC/SCC/Melanoma   Cervical cancer Maternal Aunt 14       daughter w/ cervical cancer as well   Lung cancer Maternal Aunt    Melanoma Maternal Uncle        other uncles with BCC/SCC/Melanoma   Breast cancer Paternal Aunt    Bladder Cancer Maternal Grandmother      Social History   Tobacco Use   Smoking status: Former    Packs/day: 0.50    Years: 18.00    Additional pack years: 0.00    Total pack years: 9.00    Types: Cigarettes    Start date: 06/21/2018    Quit date: 12/11/2022    Years since quitting: 0.3   Smokeless tobacco: Never  Vaping Use   Vaping Use: Never used  Substance Use Topics   Alcohol use: No   Drug use: No    Allergies as of 04/26/2023   (No Known Allergies)    Review of Systems:    All systems reviewed and negative except where noted in HPI.   Physical Exam:  BP 107/70   Pulse (!) 108   Temp 98 F (36.7 C)   Ht 5\' 7"  (1.702 m)   Wt 185 lb (83.9 kg)   LMP  (LMP Unknown) Comment: Ovaries and tubes removed.April 2019  BMI 28.98 kg/m  No LMP recorded (lmp unknown). Patient has had a hysterectomy. Psych:  Alert and cooperative. Normal mood and affect. General:   Alert,  Well-developed, well-nourished, pleasant and cooperative in NAD Head:  Normocephalic and atraumatic. Eyes:  Sclera clear, no icterus.   Conjunctiva pink. Neck:  Supple; no masses or thyromegaly. Lungs:  Respirations even and unlabored.  Clear throughout to auscultation.   No wheezes, crackles, or rhonchi. No acute distress. Heart:  Regular rate and rhythm; no murmurs, clicks, rubs, or gallops. Abdomen:  Normal bowel sounds.  No bruits.  Soft, and mildly obese without masses, hepatosplenomegaly or hernias noted.  No Tenderness.  No  guarding or rebound tenderness.    Neurologic:  Alert and oriented x3;  grossly normal neurologically. Psych:  Alert and cooperative. Normal mood and affect.  Imaging Studies: NM PET Image Restage (PS) Skull Base to Thigh (F-18 FDG)  Result Date: 04/09/2023 CLINICAL DATA:  Subsequent treatment strategy for breast cancer. EXAM: NUCLEAR MEDICINE PET SKULL BASE TO THIGH TECHNIQUE: 10.04 mCi F-18 FDG was injected intravenously. Full-ring PET imaging was performed from the skull base to thigh after the radiotracer. CT data was obtained and used for attenuation correction and anatomic localization. Fasting blood glucose: 84 mg/dl COMPARISON:  Multiple priors including most recent PET-CT October 05, 2022 FINDINGS: Mediastinal blood pool activity: SUV max 1.9 Liver activity: SUV max NA NECK: Asymmetric right-sided tonsillar FDG avidity max SUV of 6.0. Incidental CT findings: None. CHEST: No hypermetabolic thoracic adenopathy. No hypermetabolic pulmonary nodules or masses. Status post bilateral mastectomies without hypermetabolic chest wall lesion Incidental CT findings: Right chest wall Port-A-Cath with tip in the right atrium. ABDOMEN/PELVIS: No abnormal hypermetabolic activity within the liver, pancreas, adrenal glands, or spleen. No hypermetabolic lymph nodes in the abdomen or pelvis. Similar nodular stranding in the posterior gluteal subcutaneous soft tissues with low-level FDG avidity max SUV of 2.2. Incidental CT findings: Multiple small hyperdense foci in the cecum likely reflects ingested material. Gas fluid levels in the proximal colon. SKELETON: No focal hypermetabolic activity to suggest skeletal metastasis. Incidental CT findings: None. IMPRESSION: 1. Status post bilateral mastectomies without evidence of hypermetabolic local recurrence.  2. No evidence of hypermetabolic metastatic disease. 3. Asymmetric right-sided tonsillar FDG avidity is nonspecific consider further evaluation with direct visualization.  4. Gas fluid levels in the proximal colon may reflect a diarrheal illness or laxative use. Electronically Signed   By: Maudry Mayhew M.D.   On: 04/09/2023 09:50    Assessment and Plan:   SHAQUELLE HANBACK is a 42 y.o. y/o female has been referred for   Diarrhea / Irregular Bowel Habits / Change in Bowel Habits  Scheduling Colonoscopy I discussed risks of colonoscopy with patient to include risk of bleeding, colon perforation, and risk of sedation.   Patient expressed understanding and agrees to proceed with colonoscopy.   Gastritis / GERD Continue Nexium 40 Mg once daily. Add Pepcid 20 Mg twice daily and Tums antacid as needed.  Scheduling EGD I discussed risks of EGD with patient to include risk of bleeding, perforation, and risk of sedation.   Patient expressed understanding and agrees to proceed with EGD.   Recommend Lifestyle Modifications to prevent Acid Reflux.  Rec. Avoid coffee, sodas, peppermint, citrus fruits, and spicey foods.  Avoid eating 2-3 hours before bedtime.     3.   Chronic Anemia; Likely Anemia of Chronic Disease  Lab: CBC, Iron Panal, Ferritin, B12, Folate  Schedule EGD / Colon  Patient is currently on B12 and folate.  Does not take iron.  4.   Recurrent Metastatic Breast Cancer; Currently on Chemo Continue f/u with oncologist DR. Yu.  Follow up in 4 weeks after Procedures with TG  Celso Amy, PA-C

## 2023-04-25 NOTE — Telephone Encounter (Signed)
Please adjust the following appt on 5/31.   Pt will only be getting fulvestrant Inj Cancel lab/ zometa

## 2023-04-26 ENCOUNTER — Encounter: Payer: Self-pay | Admitting: Oncology

## 2023-04-26 ENCOUNTER — Other Ambulatory Visit: Payer: Self-pay

## 2023-04-26 ENCOUNTER — Encounter: Payer: Self-pay | Admitting: Physician Assistant

## 2023-04-26 ENCOUNTER — Ambulatory Visit (INDEPENDENT_AMBULATORY_CARE_PROVIDER_SITE_OTHER): Payer: Medicare HMO | Admitting: Physician Assistant

## 2023-04-26 VITALS — BP 107/70 | HR 108 | Temp 98.0°F | Ht 67.0 in | Wt 185.0 lb

## 2023-04-26 DIAGNOSIS — Z170 Estrogen receptor positive status [ER+]: Secondary | ICD-10-CM

## 2023-04-26 DIAGNOSIS — C50811 Malignant neoplasm of overlapping sites of right female breast: Secondary | ICD-10-CM

## 2023-04-26 DIAGNOSIS — C7951 Secondary malignant neoplasm of bone: Secondary | ICD-10-CM

## 2023-04-26 DIAGNOSIS — R194 Change in bowel habit: Secondary | ICD-10-CM

## 2023-04-26 DIAGNOSIS — D649 Anemia, unspecified: Secondary | ICD-10-CM

## 2023-04-26 DIAGNOSIS — K219 Gastro-esophageal reflux disease without esophagitis: Secondary | ICD-10-CM

## 2023-04-26 DIAGNOSIS — K2950 Unspecified chronic gastritis without bleeding: Secondary | ICD-10-CM | POA: Diagnosis not present

## 2023-04-26 MED ORDER — PEG 3350-KCL-NA BICARB-NACL 420 G PO SOLR: 4000.0000 mL | Freq: Once | ORAL | 0 refills | Status: AC

## 2023-04-27 LAB — CBC WITH DIFFERENTIAL
Basophils Absolute: 0.1 10*3/uL (ref 0.0–0.2)
Basos: 1 %
EOS (ABSOLUTE): 0.1 10*3/uL (ref 0.0–0.4)
Eos: 1 %
Hematocrit: 35.4 % (ref 34.0–46.6)
Hemoglobin: 11.5 g/dL (ref 11.1–15.9)
Immature Grans (Abs): 0 10*3/uL (ref 0.0–0.1)
Immature Granulocytes: 1 %
Lymphocytes Absolute: 1.8 10*3/uL (ref 0.7–3.1)
Lymphs: 27 %
MCH: 28.8 pg (ref 26.6–33.0)
MCHC: 32.5 g/dL (ref 31.5–35.7)
MCV: 89 fL (ref 79–97)
Monocytes Absolute: 0.7 10*3/uL (ref 0.1–0.9)
Monocytes: 10 %
Neutrophils Absolute: 3.9 10*3/uL (ref 1.4–7.0)
Neutrophils: 60 %
RBC: 3.99 x10E6/uL (ref 3.77–5.28)
RDW: 13 % (ref 11.7–15.4)
WBC: 6.5 10*3/uL (ref 3.4–10.8)

## 2023-04-27 LAB — IRON,TIBC AND FERRITIN PANEL
Ferritin: 36 ng/mL (ref 15–150)
Iron Saturation: 15 % (ref 15–55)
Iron: 64 ug/dL (ref 27–159)
Total Iron Binding Capacity: 427 ug/dL (ref 250–450)
UIBC: 363 ug/dL (ref 131–425)

## 2023-04-27 LAB — B12 AND FOLATE PANEL
Folate: >20 ng/mL (ref >3.0)
Vitamin B-12: 674 pg/mL (ref 232–1245)

## 2023-04-28 ENCOUNTER — Ambulatory Visit: Payer: Medicare HMO

## 2023-05-05 ENCOUNTER — Inpatient Hospital Stay: Payer: Medicare HMO

## 2023-05-05 VITALS — BP 108/73 | HR 100 | Temp 97.0°F | Resp 16 | Wt 186.3 lb

## 2023-05-05 DIAGNOSIS — C50811 Malignant neoplasm of overlapping sites of right female breast: Secondary | ICD-10-CM

## 2023-05-05 DIAGNOSIS — Z5111 Encounter for antineoplastic chemotherapy: Secondary | ICD-10-CM | POA: Diagnosis not present

## 2023-05-05 LAB — COMPREHENSIVE METABOLIC PANEL
ALT: 20 U/L (ref 0–44)
AST: 29 U/L (ref 15–41)
Albumin: 4 g/dL (ref 3.5–5.0)
Alkaline Phosphatase: 61 U/L (ref 38–126)
Anion gap: 7 (ref 5–15)
BUN: 19 mg/dL (ref 6–20)
CO2: 24 mmol/L (ref 22–32)
Calcium: 8.5 mg/dL — ABNORMAL LOW (ref 8.9–10.3)
Chloride: 107 mmol/L (ref 98–111)
Creatinine, Ser: 0.88 mg/dL (ref 0.44–1.00)
GFR, Estimated: >60 mL/min (ref >60)
Glucose, Bld: 121 mg/dL — ABNORMAL HIGH (ref 70–99)
Potassium: 3.3 mmol/L — ABNORMAL LOW (ref 3.5–5.1)
Sodium: 138 mmol/L (ref 135–145)
Total Bilirubin: 0.3 mg/dL (ref 0.3–1.2)
Total Protein: 7.1 g/dL (ref 6.5–8.1)

## 2023-05-05 LAB — CBC WITH DIFFERENTIAL/PLATELET
Abs Immature Granulocytes: 0.02 10*3/uL (ref 0.00–0.07)
Basophils Absolute: 0 10*3/uL (ref 0.0–0.1)
Basophils Relative: 1 %
Eosinophils Absolute: 0.1 10*3/uL (ref 0.0–0.5)
Eosinophils Relative: 1 %
HCT: 32.5 % — ABNORMAL LOW (ref 36.0–46.0)
Hemoglobin: 10.6 g/dL — ABNORMAL LOW (ref 12.0–15.0)
Immature Granulocytes: 0 %
Lymphocytes Relative: 29 %
Lymphs Abs: 1.5 10*3/uL (ref 0.7–4.0)
MCH: 29 pg (ref 26.0–34.0)
MCHC: 32.6 g/dL (ref 30.0–36.0)
MCV: 89 fL (ref 80.0–100.0)
Monocytes Absolute: 0.5 10*3/uL (ref 0.1–1.0)
Monocytes Relative: 9 %
Neutro Abs: 3.2 10*3/uL (ref 1.7–7.7)
Neutrophils Relative %: 60 %
Platelets: 265 10*3/uL (ref 150–400)
RBC: 3.65 MIL/uL — ABNORMAL LOW (ref 3.87–5.11)
RDW: 13.2 % (ref 11.5–15.5)
WBC: 5.3 10*3/uL (ref 4.0–10.5)
nRBC: 0 % (ref 0.0–0.2)

## 2023-05-05 MED ORDER — TRASTUZUMAB-ANNS CHEMO 150 MG IV SOLR
6.0000 mg/kg | Freq: Once | INTRAVENOUS | Status: AC
  Administered 2023-05-05: 462 mg via INTRAVENOUS
  Filled 2023-05-05: qty 22

## 2023-05-05 MED ORDER — SODIUM CHLORIDE 0.9 % IV SOLN
420.0000 mg | Freq: Once | INTRAVENOUS | Status: AC
  Administered 2023-05-05: 420 mg via INTRAVENOUS
  Filled 2023-05-05: qty 14

## 2023-05-05 MED ORDER — SODIUM CHLORIDE 0.9% FLUSH
10.0000 mL | INTRAVENOUS | Status: DC | PRN
  Filled 2023-05-05: qty 10

## 2023-05-05 MED ORDER — HEPARIN SOD (PORK) LOCK FLUSH 100 UNIT/ML IV SOLN
500.0000 [IU] | Freq: Once | INTRAVENOUS | Status: AC | PRN
  Administered 2023-05-05: 500 [IU]
  Filled 2023-05-05: qty 5

## 2023-05-05 MED ORDER — SODIUM CHLORIDE 0.9 % IV SOLN
Freq: Once | INTRAVENOUS | Status: AC
  Filled 2023-05-05: qty 250

## 2023-05-05 NOTE — Patient Instructions (Signed)
[facility]  Discharge Instructions: Thank you for choosing [facility] to provide your oncology and hematology care.  If you have a lab appointment with the Cancer Center, please go directly to the Cancer Center and check in at the registration area.  Wear comfortable clothing and clothing appropriate for easy access to any Portacath or PICC line.   We strive to give you quality time with your provider. You may need to reschedule your appointment if you arrive late (15 or more minutes).  Arriving late affects you and other patients whose appointments are after yours.  Also, if you miss three or more appointments without notifying the office, you may be dismissed from the clinic at the provider's discretion.      For prescription refill requests, have your pharmacy contact our office and allow 72 hours for refills to be completed.    Today you received the following chemotherapy and/or immunotherapy agents FASLODEX, ZOMETA, KANJINTI, PERJETA      To help prevent nausea and vomiting after your treatment, we encourage you to take your nausea medication as directed.  BELOW ARE SYMPTOMS THAT SHOULD BE REPORTED IMMEDIATELY: *FEVER GREATER THAN 100.4 F (38 C) OR HIGHER *CHILLS OR SWEATING *NAUSEA AND VOMITING THAT IS NOT CONTROLLED WITH YOUR NAUSEA MEDICATION *UNUSUAL SHORTNESS OF BREATH *UNUSUAL BRUISING OR BLEEDING *URINARY PROBLEMS (pain or burning when urinating, or frequent urination) *BOWEL PROBLEMS (unusual diarrhea, constipation, pain near the anus) TENDERNESS IN MOUTH AND THROAT WITH OR WITHOUT PRESENCE OF ULCERS (sore throat, sores in mouth, or a toothache) UNUSUAL RASH, SWELLING OR PAIN  UNUSUAL VAGINAL DISCHARGE OR ITCHING   Items with * indicate a potential emergency and should be followed up as soon as possible or go to the Emergency Department if any problems should occur.  Please show the CHEMOTHERAPY ALERT CARD or IMMUNOTHERAPY  ALERT CARD at check-in to the Emergency Department and triage nurse.  Should you have questions after your visit or need to cancel or reschedule your appointment, please contact [facility] [place] [facility]  [phone] and follow the prompts.  Office hours are 8:00 a.m. to 4:30 p.m. Monday - Friday. Please note that voicemails left after 4:00 p.m. may not be returned until the following business day.  We are closed weekends and major holidays. You have access to a nurse at all times for urgent questions. Please call the main number to the clinic [phone] and follow the prompts.  For any non-urgent questions, you may also contact your provider using MyChart. We now offer e-Visits for anyone 18 and older to request care online for non-urgent symptoms. For details visit [email or web address].   Also download the MyChart app! Go to the app store, search "MyChart", open the app, select [facility], and log in with your MyChart username and password.  Fulvestrant Injection What is this medication? FULVESTRANT (ful VES trant) treats breast cancer. It works by blocking the hormone estrogen in breast tissue, which prevents breast cancer cells from spreading or growing. This medicine may be used for other purposes; ask your health care provider or pharmacist if you have questions. COMMON BRAND NAME(S): FASLODEX What should I tell my care team before I take this medication? They need to know if you have any of these conditions: Bleeding disorder Liver disease Low blood cell levels, such as low white cells, red cells, and platelets An unusual or allergic reaction to fulvestrant, other medications, foods, dyes, or preservatives Pregnant or trying   to get pregnant Breast-feeding How should I use this medication? This medication is injected into a muscle. It is given by your care team in a hospital or clinic setting. Talk to your care team about the use of this medication in children.  Special care may be needed. Overdosage: If you think you have taken too much of this medicine contact a poison control center or emergency room at once. NOTE: This medicine is only for you. Do not share this medicine with others. What if I miss a dose? Keep appointments for follow-up doses. It is important not to miss your dose. Call your care team if you are unable to keep an appointment. What may interact with this medication? Certain medications that prevent or treat blood clots, such as warfarin, enoxaparin, dalteparin, apixaban, dabigatran, rivaroxaban This list may not describe all possible interactions. Give your health care provider a list of all the medicines, herbs, non-prescription drugs, or dietary supplements you use. Also tell them if you smoke, drink alcohol, or use illegal drugs. Some items may interact with your medicine. What should I watch for while using this medication? Your condition will be monitored carefully while you are receiving this medication. You may need blood work while taking this medication. Talk to your care team if you may be pregnant. Serious birth defects can occur if you take this medication during pregnancy and for 1 year after the last dose. You will need a negative pregnancy test before starting this medication. Contraception is recommended while taking this medication and for 1 year after the last dose. Your care team can help you find the option that works for you. Do not breastfeed while taking this medication and for 1 year after the last dose. This medication may cause infertility. Talk to your care team if you are concerned about your fertility. What side effects may I notice from receiving this medication? Side effects that you should report to your care team as soon as possible: Allergic reactions or angioedema--skin rash, itching or hives, swelling of the face, eyes, lips, tongue, arms, or legs, trouble swallowing or breathing Pain, tingling, or  numbness in the hands or feet Side effects that usually do not require medical attention (report to your care team if they continue or are bothersome): Bone, joint, or muscle pain Constipation Headache Hot flashes Nausea Pain, redness, or irritation at injection site Unusual weakness or fatigue This list may not describe all possible side effects. Call your doctor for medical advice about side effects. You may report side effects to FDA at 1-800-FDA-1088. Where should I keep my medication? This medication is given in a hospital or clinic. It will not be stored at home. NOTE: This sheet is a summary. It may not cover all possible information. If you have questions about this medicine, talk to your doctor, pharmacist, or health care provider.  2023 Elsevier/Gold Standard (2008-01-19 00:00:00)   Zoledronic Acid Injection (Cancer) What is this medication? ZOLEDRONIC ACID (ZOE le dron ik AS id) treats high calcium levels in the blood caused by cancer. It may also be used with chemotherapy to treat weakened bones caused by cancer. It works by slowing down the release of calcium from bones. This lowers calcium levels in your blood. It also makes your bones stronger and less likely to break (fracture). It belongs to a group of medications called bisphosphonates. This medicine may be used for other purposes; ask your health care provider or pharmacist if you have questions. COMMON BRAND NAME(S): Zometa,   Zometa Powder What should I tell my care team before I take this medication? They need to know if you have any of these conditions: Dehydration Dental disease Kidney disease Liver disease Low levels of calcium in the blood Lung or breathing disease, such as asthma Receiving steroids, such as dexamethasone or prednisone An unusual or allergic reaction to zoledronic acid, other medications, foods, dyes, or preservatives Pregnant or trying to get pregnant Breast-feeding How should I use this  medication? This medication is injected into a vein. It is given by your care team in a hospital or clinic setting. Talk to your care team about the use of this medication in children. Special care may be needed. Overdosage: If you think you have taken too much of this medicine contact a poison control center or emergency room at once. NOTE: This medicine is only for you. Do not share this medicine with others. What if I miss a dose? Keep appointments for follow-up doses. It is important not to miss your dose. Call your care team if you are unable to keep an appointment. What may interact with this medication? Certain antibiotics given by injection Diuretics, such as bumetanide, furosemide NSAIDs, medications for pain and inflammation, such as ibuprofen or naproxen Teriparatide Thalidomide This list may not describe all possible interactions. Give your health care provider a list of all the medicines, herbs, non-prescription drugs, or dietary supplements you use. Also tell them if you smoke, drink alcohol, or use illegal drugs. Some items may interact with your medicine. What should I watch for while using this medication? Visit your care team for regular checks on your progress. It may be some time before you see the benefit from this medication. Some people who take this medication have severe bone, joint, or muscle pain. This medication may also increase your risk for jaw problems or a broken thigh bone. Tell your care team right away if you have severe pain in your jaw, bones, joints, or muscles. Tell you care team if you have any pain that does not go away or that gets worse. Tell your dentist and dental surgeon that you are taking this medication. You should not have major dental surgery while on this medication. See your dentist to have a dental exam and fix any dental problems before starting this medication. Take good care of your teeth while on this medication. Make sure you see your  dentist for regular follow-up appointments. You should make sure you get enough calcium and vitamin D while you are taking this medication. Discuss the foods you eat and the vitamins you take with your care team. Check with your care team if you have severe diarrhea, nausea, and vomiting, or if you sweat a lot. The loss of too much body fluid may make it dangerous for you to take this medication. You may need bloodwork while taking this medication. Talk to your care team if you wish to become pregnant or think you might be pregnant. This medication can cause serious birth defects. What side effects may I notice from receiving this medication? Side effects that you should report to your care team as soon as possible: Allergic reactions--skin rash, itching, hives, swelling of the face, lips, tongue, or throat Kidney injury--decrease in the amount of urine, swelling of the ankles, hands, or feet Low calcium level--muscle pain or cramps, confusion, tingling, or numbness in the hands or feet Osteonecrosis of the jaw--pain, swelling, or redness in the mouth, numbness of the jaw, poor healing after   dental work, unusual discharge from the mouth, visible bones in the mouth Severe bone, joint, or muscle pain Side effects that usually do not require medical attention (report to your care team if they continue or are bothersome): Constipation Fatigue Fever Loss of appetite Nausea Stomach pain This list may not describe all possible side effects. Call your doctor for medical advice about side effects. You may report side effects to FDA at 1-800-FDA-1088. Where should I keep my medication? This medication is given in a hospital or clinic. It will not be stored at home. NOTE: This sheet is a summary. It may not cover all possible information. If you have questions about this medicine, talk to your doctor, pharmacist, or health care provider.  2023 Elsevier/Gold Standard (2008-01-19  00:00:00)    Trastuzumab Injection What is this medication? TRASTUZUMAB (tras TOO zoo mab) treats breast cancer and stomach cancer. It works by blocking a protein that causes cancer cells to grow and multiply. This helps to slow or stop the spread of cancer cells. This medicine may be used for other purposes; ask your health care provider or pharmacist if you have questions. COMMON BRAND NAME(S): Herceptin, Herzuma, KANJINTI, Ogivri, Ontruzant, Trazimera What should I tell my care team before I take this medication? They need to know if you have any of these conditions: Heart failure Lung disease An unusual or allergic reaction to trastuzumab, other medications, foods, dyes, or preservatives Pregnant or trying to get pregnant Breast-feeding How should I use this medication? This medication is injected into a vein. It is given by your care team in a hospital or clinic setting. Talk to your care team about the use of this medication in children. It is not approved for use in children. Overdosage: If you think you have taken too much of this medicine contact a poison control center or emergency room at once. NOTE: This medicine is only for you. Do not share this medicine with others. What if I miss a dose? Keep appointments for follow-up doses. It is important not to miss your dose. Call your care team if you are unable to keep an appointment. What may interact with this medication? Certain types of chemotherapy, such as daunorubicin, doxorubicin, epirubicin, idarubicin This list may not describe all possible interactions. Give your health care provider a list of all the medicines, herbs, non-prescription drugs, or dietary supplements you use. Also tell them if you smoke, drink alcohol, or use illegal drugs. Some items may interact with your medicine. What should I watch for while using this medication? Your condition will be monitored carefully while you are receiving this medication. This  medication may make you feel generally unwell. This is not uncommon, as chemotherapy affects healthy cells as well as cancer cells. Report any side effects. Continue your course of treatment even though you feel ill unless your care team tells you to stop. This medication may increase your risk of getting an infection. Call your care team for advice if you get a fever, chills, sore throat, or other symptoms of a cold or flu. Do not treat yourself. Try to avoid being around people who are sick. Avoid taking medications that contain aspirin, acetaminophen, ibuprofen, naproxen, or ketoprofen unless instructed by your care team. These medications can hide a fever. Talk to your care team if you may be pregnant. Serious birth defects can occur if you take this medication during pregnancy and for 7 months after the last dose. You will need a negative pregnancy test before   starting this medication. Contraception is recommended while taking this medication and for 7 months after the last dose. Your care team can help you find the option that works for you. Do not breastfeed while taking this medication and for 7 months after stopping treatment. What side effects may I notice from receiving this medication? Side effects that you should report to your care team as soon as possible: Allergic reactions or angioedema--skin rash, itching or hives, swelling of the face, eyes, lips, tongue, arms, or legs, trouble swallowing or breathing Dry cough, shortness of breath or trouble breathing Heart failure--shortness of breath, swelling of the ankles, feet, or hands, sudden weight gain, unusual weakness or fatigue Infection--fever, chills, cough, or sore throat Infusion reactions--chest pain, shortness of breath or trouble breathing, feeling faint or lightheaded Side effects that usually do not require medical attention (report to your care team if they continue or are  bothersome): Diarrhea Dizziness Headache Nausea Trouble sleeping Vomiting This list may not describe all possible side effects. Call your doctor for medical advice about side effects. You may report side effects to FDA at 1-800-FDA-1088. Where should I keep my medication? This medication is given in a hospital or clinic. It will not be stored at home. NOTE: This sheet is a summary. It may not cover all possible information. If you have questions about this medicine, talk to your doctor, pharmacist, or health care provider.  2023 Elsevier/Gold Standard (2022-03-31 00:00:00)  Pertuzumab Injection What is this medication? PERTUZUMAB (per TOOZ ue mab) treats breast cancer. It works by blocking a protein that causes cancer cells to grow and multiply. This helps to slow or stop the spread of cancer cells. It is a monoclonal antibody. This medicine may be used for other purposes; ask your health care provider or pharmacist if you have questions. COMMON BRAND NAME(S): PERJETA What should I tell my care team before I take this medication? They need to know if you have any of these conditions: Heart failure An unusual or allergic reaction to pertuzumab, other medications, foods, dyes, or preservatives Pregnant or trying to get pregnant Breast-feeding How should I use this medication? This medication is injected into a vein. It is given by your care team in a hospital or clinic setting. Talk to your care team about the use of this medication in children. Special care may be needed. Overdosage: If you think you have taken too much of this medicine contact a poison control center or emergency room at once. NOTE: This medicine is only for you. Do not share this medicine with others. What if I miss a dose? Keep appointments for follow-up doses. It is important not to miss your dose. Call your care team if you are unable to keep an appointment. What may interact with this medication? Interactions  are not expected. This list may not describe all possible interactions. Give your health care provider a list of all the medicines, herbs, non-prescription drugs, or dietary supplements you use. Also tell them if you smoke, drink alcohol, or use illegal drugs. Some items may interact with your medicine. What should I watch for while using this medication? Your condition will be monitored carefully while you are receiving this medication. This medication may make you feel generally unwell. This is not uncommon as chemotherapy can affect healthy cells as well as cancer cells. Report any side effects. Continue your course of treatment even though you feel ill unless your care team tells you to stop. Talk to your care team   if you may be pregnant. Serious birth defects can occur if you take this medication during pregnancy and for 7 months after the last dose. You will need a negative pregnancy test before starting this medication. Contraception is recommended while taking this medication and for 7 months after the last dose. Your care team can help you find the option that works for you. Do not breastfeed while taking this medication and for 7 months after the last dose. What side effects may I notice from receiving this medication? Side effects that you should report to your care team as soon as possible: Allergic reactions or angioedema--skin rash, itching or hives, swelling of the face, eyes, lips, tongue, arms, or legs, trouble swallowing or breathing Heart failure--shortness of breath, swelling of the ankles, feet, or hands, sudden weight gain, unusual weakness or fatigue Infusion reactions--chest pain, shortness of breath or trouble breathing, feeling faint or lightheaded Side effects that usually do not require medical attention (report to your care team if they continue or are bothersome): Diarrhea Dry skin Fatigue Hair loss Nausea Vomiting This list may not describe all possible side effects.  Call your doctor for medical advice about side effects. You may report side effects to FDA at 1-800-FDA-1088. Where should I keep my medication? This medication is given in a hospital or clinic. It will not be stored at home. NOTE: This sheet is a summary. It may not cover all possible information. If you have questions about this medicine, talk to your doctor, pharmacist, or health care provider.  2023 Elsevier/Gold Standard (2011-05-24 00:00:00)   

## 2023-05-05 NOTE — Patient Instructions (Signed)

## 2023-05-09 DIAGNOSIS — S92353A Displaced fracture of fifth metatarsal bone, unspecified foot, initial encounter for closed fracture: Secondary | ICD-10-CM | POA: Insufficient documentation

## 2023-05-10 ENCOUNTER — Other Ambulatory Visit (INDEPENDENT_AMBULATORY_CARE_PROVIDER_SITE_OTHER): Payer: Self-pay | Admitting: Oncology

## 2023-05-10 ENCOUNTER — Other Ambulatory Visit: Payer: Self-pay | Admitting: Oncology

## 2023-05-11 ENCOUNTER — Encounter: Payer: Self-pay | Admitting: Oncology

## 2023-05-11 MED ORDER — OXYCODONE HCL 5 MG PO TABS: 5.0000 mg | ORAL_TABLET | Freq: Four times a day (QID) | ORAL | 0 refills | Status: DC | PRN

## 2023-05-12 ENCOUNTER — Inpatient Hospital Stay: Payer: Medicare HMO

## 2023-05-12 ENCOUNTER — Ambulatory Visit: Payer: Medicare HMO

## 2023-05-12 ENCOUNTER — Other Ambulatory Visit: Payer: Self-pay | Admitting: Oncology

## 2023-05-15 ENCOUNTER — Inpatient Hospital Stay: Payer: Medicare HMO | Attending: Oncology

## 2023-05-15 DIAGNOSIS — C7951 Secondary malignant neoplasm of bone: Secondary | ICD-10-CM | POA: Diagnosis not present

## 2023-05-15 DIAGNOSIS — Z5111 Encounter for antineoplastic chemotherapy: Secondary | ICD-10-CM | POA: Diagnosis present

## 2023-05-15 DIAGNOSIS — C50411 Malignant neoplasm of upper-outer quadrant of right female breast: Secondary | ICD-10-CM | POA: Insufficient documentation

## 2023-05-15 DIAGNOSIS — Z79899 Other long term (current) drug therapy: Secondary | ICD-10-CM | POA: Diagnosis not present

## 2023-05-15 DIAGNOSIS — Z5112 Encounter for antineoplastic immunotherapy: Secondary | ICD-10-CM | POA: Insufficient documentation

## 2023-05-15 MED ORDER — FULVESTRANT 250 MG/5ML IM SOSY
500.0000 mg | PREFILLED_SYRINGE | INTRAMUSCULAR | Status: DC
  Administered 2023-05-15: 500 mg via INTRAMUSCULAR
  Filled 2023-05-15: qty 10

## 2023-05-26 ENCOUNTER — Inpatient Hospital Stay (HOSPITAL_BASED_OUTPATIENT_CLINIC_OR_DEPARTMENT_OTHER): Payer: Medicare HMO | Admitting: Oncology

## 2023-05-26 ENCOUNTER — Inpatient Hospital Stay: Payer: Medicare HMO

## 2023-05-26 ENCOUNTER — Ambulatory Visit: Payer: Medicare HMO

## 2023-05-26 ENCOUNTER — Encounter: Payer: Self-pay | Admitting: Oncology

## 2023-05-26 VITALS — BP 115/75 | HR 104 | Temp 96.7°F | Resp 18 | Wt 188.5 lb

## 2023-05-26 DIAGNOSIS — G620 Drug-induced polyneuropathy: Secondary | ICD-10-CM

## 2023-05-26 DIAGNOSIS — Z5112 Encounter for antineoplastic immunotherapy: Secondary | ICD-10-CM

## 2023-05-26 DIAGNOSIS — T451X5A Adverse effect of antineoplastic and immunosuppressive drugs, initial encounter: Secondary | ICD-10-CM

## 2023-05-26 DIAGNOSIS — Z170 Estrogen receptor positive status [ER+]: Secondary | ICD-10-CM

## 2023-05-26 DIAGNOSIS — C7951 Secondary malignant neoplasm of bone: Secondary | ICD-10-CM

## 2023-05-26 DIAGNOSIS — R21 Rash and other nonspecific skin eruption: Secondary | ICD-10-CM | POA: Diagnosis not present

## 2023-05-26 DIAGNOSIS — C50411 Malignant neoplasm of upper-outer quadrant of right female breast: Secondary | ICD-10-CM

## 2023-05-26 DIAGNOSIS — C50811 Malignant neoplasm of overlapping sites of right female breast: Secondary | ICD-10-CM | POA: Diagnosis not present

## 2023-05-26 DIAGNOSIS — E876 Hypokalemia: Secondary | ICD-10-CM

## 2023-05-26 DIAGNOSIS — I428 Other cardiomyopathies: Secondary | ICD-10-CM

## 2023-05-26 DIAGNOSIS — M069 Rheumatoid arthritis, unspecified: Secondary | ICD-10-CM

## 2023-05-26 LAB — COMPREHENSIVE METABOLIC PANEL
ALT: 20 U/L (ref 0–44)
AST: 27 U/L (ref 15–41)
Albumin: 4.1 g/dL (ref 3.5–5.0)
Alkaline Phosphatase: 70 U/L (ref 38–126)
Anion gap: 8 (ref 5–15)
BUN: 18 mg/dL (ref 6–20)
CO2: 24 mmol/L (ref 22–32)
Calcium: 9 mg/dL (ref 8.9–10.3)
Chloride: 107 mmol/L (ref 98–111)
Creatinine, Ser: 0.87 mg/dL (ref 0.44–1.00)
GFR, Estimated: >60 mL/min (ref >60)
Glucose, Bld: 119 mg/dL — ABNORMAL HIGH (ref 70–99)
Potassium: 3.3 mmol/L — ABNORMAL LOW (ref 3.5–5.1)
Sodium: 139 mmol/L (ref 135–145)
Total Bilirubin: 0.4 mg/dL (ref 0.3–1.2)
Total Protein: 7.4 g/dL (ref 6.5–8.1)

## 2023-05-26 LAB — CBC WITH DIFFERENTIAL/PLATELET
Abs Immature Granulocytes: 0.01 10*3/uL (ref 0.00–0.07)
Basophils Absolute: 0.1 10*3/uL (ref 0.0–0.1)
Basophils Relative: 1 %
Eosinophils Absolute: 0.1 10*3/uL (ref 0.0–0.5)
Eosinophils Relative: 1 %
HCT: 31.4 % — ABNORMAL LOW (ref 36.0–46.0)
Hemoglobin: 10.5 g/dL — ABNORMAL LOW (ref 12.0–15.0)
Immature Granulocytes: 0 %
Lymphocytes Relative: 26 %
Lymphs Abs: 1.9 10*3/uL (ref 0.7–4.0)
MCH: 29.2 pg (ref 26.0–34.0)
MCHC: 33.4 g/dL (ref 30.0–36.0)
MCV: 87.5 fL (ref 80.0–100.0)
Monocytes Absolute: 0.8 10*3/uL (ref 0.1–1.0)
Monocytes Relative: 11 %
Neutro Abs: 4.6 10*3/uL (ref 1.7–7.7)
Neutrophils Relative %: 61 %
Platelets: 275 10*3/uL (ref 150–400)
RBC: 3.59 MIL/uL — ABNORMAL LOW (ref 3.87–5.11)
RDW: 13.4 % (ref 11.5–15.5)
WBC: 7.4 10*3/uL (ref 4.0–10.5)
nRBC: 0 % (ref 0.0–0.2)

## 2023-05-26 MED ORDER — SODIUM CHLORIDE 0.9 % IV SOLN
420.0000 mg | Freq: Once | INTRAVENOUS | Status: AC
  Administered 2023-05-26: 420 mg via INTRAVENOUS
  Filled 2023-05-26: qty 14

## 2023-05-26 MED ORDER — HEPARIN SOD (PORK) LOCK FLUSH 100 UNIT/ML IV SOLN
500.0000 [IU] | Freq: Once | INTRAVENOUS | Status: AC | PRN
  Administered 2023-05-26: 500 [IU]
  Filled 2023-05-26: qty 5

## 2023-05-26 MED ORDER — TRASTUZUMAB-ANNS CHEMO 150 MG IV SOLR
6.0000 mg/kg | Freq: Once | INTRAVENOUS | Status: AC
  Administered 2023-05-26: 462 mg via INTRAVENOUS
  Filled 2023-05-26: qty 22

## 2023-05-26 MED ORDER — SODIUM CHLORIDE 0.9 % IV SOLN
Freq: Once | INTRAVENOUS | Status: AC
  Filled 2023-05-26: qty 250

## 2023-05-26 MED ORDER — HYDROXYZINE HCL 10 MG PO TABS: 10.0000 mg | ORAL_TABLET | Freq: Four times a day (QID) | ORAL | 5 refills | Status: DC | PRN

## 2023-05-26 NOTE — Assessment & Plan Note (Signed)
Continue calcium supplementation 600mg  TID.

## 2023-05-26 NOTE — Assessment & Plan Note (Addendum)
Etiology unknown.  DDX inclues allergic reaction secondary to medication, pollen, sunlight, etc.  She finished prednisone tapering course, some improvement, but worse again.  I advise patient to see Dermatology if no improvement.

## 2023-05-26 NOTE — Assessment & Plan Note (Signed)
status post radiation. Continue Zometa monthly, hold if Calcium is <8.6 Continue calcium supplementation. Continue oxycodone as needed.   

## 2023-05-26 NOTE — Assessment & Plan Note (Signed)
Follow up with  rheumatology.  Patient is on methotrexate,  plaquenil.  

## 2023-05-26 NOTE — Assessment & Plan Note (Signed)
Chronic tachycardia Stable low-normal LVEF 50-55%. Follow up with cardiology. She gets ECHO Q3 months. 

## 2023-05-26 NOTE — Patient Instructions (Signed)
[facility] [place] [facility]  Discharge Instructions: Thank you for choosing [facility] to provide your oncology and hematology care.  If you have a lab appointment with the Cancer Center, please go directly to the Cancer Center and check in at the registration area.  Wear comfortable clothing and clothing appropriate for easy access to any Portacath or PICC line.   We strive to give you quality time with your provider. You may need to reschedule your appointment if you arrive late (15 or more minutes).  Arriving late affects you and other patients whose appointments are after yours.  Also, if you miss three or more appointments without notifying the office, you may be dismissed from the clinic at the provider's discretion.      For prescription refill requests, have your pharmacy contact our office and allow 72 hours for refills to be completed.    Today you received the following chemotherapy and/or immunotherapy agents- Trastuzumab, Pertuzumab      To help prevent nausea and vomiting after your treatment, we encourage you to take your nausea medication as directed.  BELOW ARE SYMPTOMS THAT SHOULD BE REPORTED IMMEDIATELY: *FEVER GREATER THAN 100.4 F (38 C) OR HIGHER *CHILLS OR SWEATING *NAUSEA AND VOMITING THAT IS NOT CONTROLLED WITH YOUR NAUSEA MEDICATION *UNUSUAL SHORTNESS OF BREATH *UNUSUAL BRUISING OR BLEEDING *URINARY PROBLEMS (pain or burning when urinating, or frequent urination) *BOWEL PROBLEMS (unusual diarrhea, constipation, pain near the anus) TENDERNESS IN MOUTH AND THROAT WITH OR WITHOUT PRESENCE OF ULCERS (sore throat, sores in mouth, or a toothache) UNUSUAL RASH, SWELLING OR PAIN  UNUSUAL VAGINAL DISCHARGE OR ITCHING   Items with * indicate a potential emergency and should be followed up as soon as possible or go to the Emergency Department if any problems should occur.  Please show the CHEMOTHERAPY ALERT CARD or IMMUNOTHERAPY ALERT CARD  at check-in to the Emergency Department and triage nurse.  Should you have questions after your visit or need to cancel or reschedule your appointment, please contact [facility] [place] [facility]  [phone] and follow the prompts.  Office hours are 8:00 a.m. to 4:30 p.m. Monday - Friday. Please note that voicemails left after 4:00 p.m. may not be returned until the following business day.  We are closed weekends and major holidays. You have access to a nurse at all times for urgent questions. Please call the main number to the clinic [phone] and follow the prompts.  For any non-urgent questions, you may also contact your provider using MyChart. We now offer e-Visits for anyone 18 and older to request care online for non-urgent symptoms. For details visit [email or web address].   Also download the MyChart app! Go to the app store, search "MyChart", open the app, select [facility], and log in with your MyChart username and password.    

## 2023-05-26 NOTE — Assessment & Plan Note (Addendum)
Recurrent breast cancer with isolated bone metastasis, stage IV #Initially stage IIIA right breast cancer, ER positive, HER-2 negative status post bilateral oophorectomy,Right mastectomy and right axillary lymph node dissection, status post implant, and implant removal.  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 ER positive, HER-2 positive  .- s/p Palliative radiation to T7  Labs reviewed and discussed Proceed with Kanjinti Perjecta.   Continue fulvestrant monthly.  

## 2023-05-26 NOTE — Assessment & Plan Note (Signed)
Antibody treatment plan as list above. 

## 2023-05-26 NOTE — Progress Notes (Signed)
Hematology/Oncology Progress note [phone] [phone]    REASON FOR VISIT Follow up for  treatment of breast cancer   ASSESSMENT & PLAN:   Cancer Staging  Breast cancer of upper-outer quadrant of right female breast (HCC) Staging form: Breast, AJCC 8th Edition - Clinical: G2, ER+, PR+, HER2- - Signed by [name], MD on 04/05/2018 - Pathologic stage from 04/04/2018: No Stage Recommended (ypT3, pN2, cM0, G3, ER+, PR-, HER2-) - Signed by [name], MD on 04/04/2018 - Pathologic: Stage IV (rpTX, pNX, cM1, ER+, PR-, HER2+) - Signed by [name], MD on 12/10/2020   Breast cancer of upper-outer quadrant of right female breast (HCC) Recurrent breast cancer with isolated bone metastasis, stage IV #Initially stage IIIA right breast cancer, ER positive, HER-2 negative status post bilateral oophorectomy,Right mastectomy and right axillary lymph node dissection, status post implant, and implant removal.  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 ER positive, HER-2 positive  .- s/p Palliative radiation to T7  Labs reviewed and discussed Proceed with Kanjinti Perjecta.   Continue fulvestrant monthly.     Nonischemic cardiomyopathy (HCC)  Chronic tachycardia Stable low-normal LVEF 50-55%. Follow up with cardiology. She gets ECHO Q3 months.  Neuropathy due to chemotherapeutic drug (HCC) continue Lyrica and nortriptyline symptoms stable  Rheumatoid arthritis (HCC) Follow up with  rheumatology.  Patient is on methotrexate,  plaquenil.   Hypocalcemia Continue calcium supplementation 600mg  TID.    Encounter for monoclonal antibody treatment for malignancy Antibody treatment plan as list above.  Malignant neoplasm metastatic to bone [facility]) status post radiation. Continue Zometa monthly, hold if Calcium is <8.6 Continue calcium supplementation. Continue oxycodone as needed.     Hypokalemia She does not tolerate oral potassium.  Potassium is stable.  Skin rash Etiology unknown.  DDX inclues allergic reaction secondary to medication, pollen, sunlight, etc.  She finished prednisone tapering course, some improvement, but worse again.  I advise patient to see Dermatology if no improvement.    Orders Placed This Encounter  Procedures   Comprehensive metabolic panel    Standing Status:   Future    Standing Expiration Date:   07/06/2024   CBC with Differential    Standing Status:   Future    Standing Expiration Date:   07/06/2024   Ambulatory referral to Dermatology    Referral Priority:   Routine    Referral Type:   Consultation    Referral Reason:   Specialty Services Required    Requested Specialty:   Dermatology    Number of Visits Requested:   1   Follow up  lab Kanjinti Perjecta 3 weeks.  lab MD Kanjinti Perjecta 6 weeks.  fulvestrant/Zometa Q 4 weeks   All questions were answered. The patient knows to call the clinic with any problems, questions or concerns.  [name], MD, PhD [facility] Hematology Oncology 05/26/2023       Oncology History  Oncology History Overview Note        Malignant neoplasm of overlapping sites of right breast in female, estrogen receptor positive (HCC)  Breast cancer of upper-outer quadrant of right female breast (HCC)  10/19/2017 Initial Diagnosis   Malignant neoplasm of overlapping sites of right breast in female, estrogen receptor positive (HCC)   11/08/2017 - 01/03/2018 Chemotherapy   Neoadjuvant ddAC x 4 + 1 cycle of Taxol  due to lack of response, surgery was offered.   03/19/2018 Surgery   S/p right mastectomy  and right axillary dissection, immediate breast reconstruction with placement of expanders.  ypT3 ypN2, + lymphovascular invasion,  Grade 3, margin is negative, close. ER 90%, PR 0%, HER2 IHC negative.   # elective bilateral salpingo-oophorectomy..    06/11/2018 Imaging   s/p 11 cycles Taxol  adjuvantly   10/10/2018 -  Radiation Therapy   adjuvant right chest wall radiation   06/03/2019 Surgery   underwent elective left prophylactic mastectomy and sentinel lymph node biopsy of left axilla Pathology negative for malignancy  # Mediport removal  # right implant removal on    07/07/2020 Imaging   MRI thoracic spine without contrast showed lesions involving the T7 posterior elements most concerning for metastatic lesion.  No evidence of epidural tumor.  Minimal thoracic spondylosis without stenosis. MRI was reviewed by me and a PET scan was obtained for further evaluation   07/20/2020 Imaging   PET scan showed hypermetabolic metastasis involving the posterior element of T7, no additional evidence of metastasis in the neck, chest, abdomen or pelvis.   07/29/2020 Procedure   T7 lesion biopsy showed metastatic carcinoma, compatible with breast origin.  Receptor status staining showed ER 71-80% positive, PR negative, HER-2 positive IHC 3+   08/31/2020 -  Radiation Therapy    finished spine radiation    09/17/2020 -  Chemotherapy   Patient is on Treatment Plan :  BREAST Trastuzumab + Pertuzumab q21d     12/10/2020 Cancer Staging   Staging form: Breast, AJCC 8th Edition - Pathologic: Stage IV (rpTX, pNX, cM1, ER+, PR-, HER2+) - Signed by [name], MD on 12/10/2020   03/15/2021 Imaging    PET scan showed no focal hypermetabolic activity to suggest skeletal metastasis.  Mild hypermetabolic activity along the right T7-8 paraspinal musculature.  Max SUV 3.3.  Likely postprocedural-   09/28/2021 Echocardiogram   further decrease of LVEF to 45%   10/14/2021 Imaging   PET  Post bilateral mastectomy and RIGHT axillary dissection without signs of recurrent or metastatic disease.   11/12/2021 Echocardiogram   LVEF of 45-50%.   02/18/2022 Imaging   MRI thoracic and lumbar spine w wo contrast  1. Negative MRIs of the thoracic and lumbar spine. No evidence for locally recurrent metastasis at  the level of T7. No other new metastatic disease elsewhere with thoracolumbar spine. 2. No significant disc pathology, stenosis, or evidence for neural impingement.    02/25/2022 Echocardiogram   LVEF 55-60%.   03/16/2022 Imaging   PET showed Stable PET-CT. No findings for local recurrent breast cancer,locoregional adenopathy or distant metastatic disease.    07/20/2022 Imaging   MRI Thoracic Spine w wo contrast 1. Interval resolution of the increased T2 signal contrast enhancement in the posterior elements of T7. No new lesion identified. 2. No spinal canal or neural foraminal stenosis.   08/23/2022 Echocardiogram   Stable low-normal LVEF at 50-55%.   10/05/2022 Imaging   PET restaging Stable examination without evidence of hypermetabolic recurrence or metastatic disease.   Persistent minimally metabolic stranding in the posterior bilateral gluteal soft tissues may reflect sequela of subcutaneous injections   02/24/2023 Echocardiogram   Echocardiogram showed stable low normal LVEF 50-55%.      INTERVAL HISTORY 42 yo female with above oncology history reviewed by me presents for follow-up of management of metastatic Triple positive breast cancer.  Chronic back pain, unchanged.  Neck/upper back pain for 1 week Intermittent diarrhea, manageable.  No fever or chills. Bilateral upper extremities skin rash, itchy, she took prednisone benadryl with no  significant improvement.      Review of Systems  Constitutional:  Negative for chills, fever, malaise/fatigue and weight loss.  HENT:  Negative for sore throat.        Hair loss  Eyes:  Negative for redness.  Respiratory:  Negative for cough, shortness of breath and wheezing.   Cardiovascular:  Negative for chest pain, palpitations and leg swelling.  Gastrointestinal:  Negative for abdominal pain, blood in stool, heartburn, nausea and vomiting.  Genitourinary:  Negative for dysuria.  Musculoskeletal:  Positive for back pain and joint  pain.  Skin:  Positive for rash.  Neurological:  Positive for tingling. Negative for dizziness and tremors.  Endo/Heme/Allergies:  Does not bruise/bleed easily.  Psychiatric/Behavioral:  Negative for hallucinations. The patient does not have insomnia.     No Known Allergies  Patient Active Problem List   Diagnosis Date Noted   Breast cancer of upper-outer quadrant of right female breast (HCC) 03/19/2018    Priority: High   Skin rash 04/14/2023    Priority: Medium    Hypokalemia 06/03/2022    Priority: Medium    Malignant neoplasm metastatic to bone (HCC) 03/25/2021    Priority: Medium    Encounter for monoclonal antibody treatment for malignancy 12/10/2020    Priority: Medium    Hypocalcemia 11/19/2020    Priority: Medium    Neuropathy due to chemotherapeutic drug (HCC) 08/11/2020    Priority: Medium    Rheumatoid arthritis (HCC) 10/13/2019    Priority: Medium    Nonischemic cardiomyopathy (HCC) 08/23/2018    Priority: Medium    Abnormal bowel movement 04/14/2023    Priority: Low   Right shoulder pain 01/20/2023    Priority: Low   Thyroid nodule 06/04/2022    Priority: Low   Encounter for antineoplastic chemotherapy 10/29/2020    Priority: Low   Goals of care, counseling/discussion 08/11/2020    Priority: Low   Gastroesophageal reflux disease without esophagitis 02/24/2017    Priority: Low   Tachycardia, unspecified 04/15/2021   Bone lesion 11/19/2020   Inflammatory arthritis 11/19/2020   HER2-positive carcinoma of breast (HCC) 08/11/2020   Bilateral hand swelling 10/03/2019   Fracture of neck of metacarpal bone 05/14/2019   Chronic fatigue 04/09/2019   Polyarthralgia 04/09/2019   Status post right breast reconstruction 02/26/2019   Status post right mastectomy 02/26/2019   Mastalgia 02/15/2019   Shortness of breath 08/23/2018   Preprocedural cardiovascular examination 08/23/2018   Tachycardia 08/23/2018   Palpitations 08/23/2018   Estrogen receptor positive  status (ER+) 04/04/2018   Acquired absence of right breast and nipple 04/03/2018   Family history of cancer    Malignant neoplasm of overlapping sites of right breast in female, estrogen receptor positive (HCC) 10/19/2017   Generalized anxiety disorder 10/03/2014   Headache 10/03/2014     Past Medical History:  Diagnosis Date   Anemia    Arthritis    BRCA negative 11/26/2017   Breast cancer (HCC) 10/11/2017   Multifocal, ER positive, PR negative, HER-2 negative. ypT3 ypN2a 8.7 cm, 4/15 nodes   Cardiomyopathy (HCC)    a. 10/2017 Echo: EF 60-65%, no rwma, Gr1 DD, nl RV size/fxn; b. 04/2018 Echo: EF 55-60%, no rwma, Nl RV size/fxn; c. 08/2018 Echo: EF 45%, diff HK, ? HK of antsept wall. Gr1 DD. Mild MR. Mild LAE/RAE. Mod dil RV.    Chronic bronchitis (HCC) 11/2017   COPD (chronic obstructive pulmonary disease) (HCC)    MILD PER CXR   Depression    Family history  of cancer    GERD (gastroesophageal reflux disease)    Headache    MIGRAINES   Heart murmur    ASYMPTOMATIC   Personal history of chemotherapy    current for right breast ca     Past Surgical History:  Procedure Laterality Date   AXILLARY LYMPH NODE DISSECTION Right 03/19/2018   Procedure: AXILLARY LYMPH NODE DISSECTION;  Surgeon: Earline Mayotte, MD;  Location: [facility];  Service: General;  Laterality: Right;   BREAST BIOPSY Right 10/11/2017   12:30 posterior coil clip invasive mammary carcinoma   BREAST BIOPSY Right 10/11/2017   11:30 middle depth ribbon clip DCIS   BREAST BIOPSY Right 10/11/2017   5:30 anterior depth x shape invasive ductal carcinoma   BREAST IMPLANT REMOVAL Right 06/03/2019   Procedure: REMOVAL OF RIGHT BREAST IMPLANTS;  Surgeon: Peggye Form, DO;  Location: [facility];  Service: Plastics;  Laterality: Right;   BREAST RECONSTRUCTION WITH PLACEMENT OF TISSUE EXPANDER AND FLEX HD (ACELLULAR HYDRATED DERMIS) Right 03/19/2018   Procedure: BREAST RECONSTRUCTION WITH PLACEMENT OF TISSUE EXPANDER AND  FLEX HD (ACELLULAR HYDRATED DERMIS);  Surgeon: Peggye Form, DO;  Location: [facility];  Service: Plastics;  Laterality: Right;   CARPAL TUNNEL RELEASE Bilateral 2020   CHOLECYSTECTOMY N/A 04/27/2020   Procedure: LAPAROSCOPIC CHOLECYSTECTOMY WITH INTRAOPERATIVE CHOLANGIOGRAM;  Surgeon: Earline Mayotte, MD;  Location: [facility];  Service: General;  Laterality: N/A;   ESOPHAGOGASTRODUODENOSCOPY (EGD) WITH PROPOFOL N/A 04/17/2020   Procedure: ESOPHAGOGASTRODUODENOSCOPY (EGD) WITH PROPOFOL;  Surgeon: Earline Mayotte, MD;  Location: [facility];  Service: Endoscopy;  Laterality: N/A;  with biopsy   LAPAROSCOPIC BILATERAL SALPINGO OOPHERECTOMY Bilateral 03/19/2018   Procedure: LAPAROSCOPIC BILATERAL SALPINGO OOPHORECTOMY;  Surgeon: Christeen Douglas, MD;  Location: [facility];  Service: Gynecology;  Laterality: Bilateral;   MASTECTOMY Right 03/2018   MASTECTOMY W/ SENTINEL NODE BIOPSY Right 03/19/2018   Procedure: MASTECTOMY WITH SENTINEL LYMPH NODE BIOPSY;  Surgeon: Earline Mayotte, MD;  Location: [facility];  Service: General;  Laterality: Right;   PORT-A-CATH REMOVAL Left 06/03/2019   Procedure: REMOVAL PORT-A-CATH;  Surgeon: Earline Mayotte, MD;  Location: [facility];  Service: General;  Laterality: Left;   PORTACATH PLACEMENT Left 10/24/2017   Procedure: INSERTION PORT-A-CATH;  Surgeon: Earline Mayotte, MD;  Location: [facility];  Service: General;  Laterality: Left;   PORTACATH PLACEMENT Right 09/28/2020   Procedure: INSERTION PORT-A-CATH;  Surgeon: Earline Mayotte, MD;  Location: [facility];  Service: General;  Laterality: Right;   REMOVAL OF TISSUE EXPANDER AND PLACEMENT OF IMPLANT Right 07/20/2018   Procedure: REMOVAL OF RIGHT BREAST TISSUE EXPANDER AND PLACEMENT OF IMPLANT;  Surgeon: Peggye Form, DO;  Location: [facility];  Service: Plastics;  Laterality: Right;   SIMPLE MASTECTOMY WITH AXILLARY SENTINEL NODE BIOPSY Left 06/03/2019   Procedure: SIMPLE MASTECTOMY  LEFT;  Surgeon: Earline Mayotte, MD;  Location: [facility];  Service: General;  Laterality: Left;    Social History   Socioeconomic History   Marital status: Married    Spouse name: Not on file   Number of children: Not on file   Years of education: Not on file   Highest education level: Not on file  Occupational History   Occupation: pharmacy tech    Comment: [employer]up[employer]ist [employer]   Tobacco Use   Smoking status: Former    Packs/day: 0.50    Years: 18.00    Additional pack years: 0.00    Total pack years: 9.00  Types: Cigarettes    Start date: 06/21/2018    Quit date: 12/11/2022    Years since quitting: 0.4   Smokeless tobacco: Never  Vaping Use   Vaping Use: Never used  Substance and Sexual Activity   Alcohol use: No   Drug use: No   Sexual activity: Yes    Birth control/protection: Injection, Other-see comments    Comment: has had hysterectomy  Other Topics Concern   Not on file  Social History Narrative   Lives at home with husband and daughter   Social Determinants of Health   Financial Resource Strain: Not on file  Food Insecurity: Not on file  Transportation Needs: Not on file  Physical Activity: Not on file  Stress: Not on file  Social Connections: Not on file  Intimate Partner Violence: Not on file     Family History  Problem Relation Age of Onset   Diabetes Father    Hypertension Father    Hyperlipidemia Father    Heart attack Father 61       "mild"   Melanoma Maternal Aunt        other aunts with BCC/SCC/Melanoma   Cervical cancer Maternal Aunt 17       daughter w/ cervical cancer as well   Lung cancer Maternal Aunt    Melanoma Maternal Uncle        other uncles with BCC/SCC/Melanoma   Breast cancer Paternal Aunt    Bladder Cancer Maternal Grandmother   Biological mother had Grave's disease.    Current Outpatient Medications:    acetaminophen (TYLENOL) 500 MG tablet, Take 500 mg by mouth every 6 (six) hours as needed.,  Disp: , Rfl:    albuterol (VENTOLIN HFA) 108 (90 Base) MCG/ACT inhaler, Inhale 2 puffs into the lungs every 6 (six) hours as needed for wheezing or shortness of breath. , Disp: , Rfl:    CALCIUM CARBONATE-VITAMIN D PO, Take 600-800 mg by mouth in the morning, at noon, and at bedtime., Disp: , Rfl:    chlorhexidine (PERIDEX) 0.12 % solution, Use as directed 15 mLs in the mouth or throat 2 (two) times daily., Disp: 120 mL, Rfl: 0   cyanocobalamin (,VITAMIN B-12,) 1000 MCG/ML injection, Inject 1,000 mcg into the skin every 30 (thirty) days., Disp: , Rfl:    cyclobenzaprine (FLEXERIL) 5 MG tablet, Take 5 mg by mouth 3 (three) times daily as needed for muscle spasms., Disp: , Rfl:    diphenoxylate-atropine (LOMOTIL) 2.5-0.025 MG tablet, Take 1 tablet by mouth 4 (four) times daily as needed for diarrhea or loose stools., Disp: 60 tablet, Rfl: 0   escitalopram (LEXAPRO) 20 MG tablet, Take 20 mg by mouth daily., Disp: , Rfl:    esomeprazole (NEXIUM) 40 MG capsule, Take 40 mg by mouth daily before breakfast. , Disp: , Rfl:    Eszopiclone 3 MG TABS, Take 3 mg by mouth at bedtime as needed (sleep). , Disp: , Rfl:    folic acid (FOLVITE) 1 MG tablet, Take 1 mg by mouth daily., Disp: , Rfl:    hydroxychloroquine (PLAQUENIL) 200 MG tablet, Take 200 mg by mouth daily., Disp: , Rfl:    hydrOXYzine (ATARAX) 10 MG tablet, Take 1 tablet (10 mg total) by mouth every 6 (six) hours as needed., Disp: 60 tablet, Rfl: 5   ibuprofen (ADVIL) 800 MG tablet, Take 800 mg by mouth every 8 (eight) hours as needed for moderate pain., Disp: , Rfl:    loperamide (IMODIUM) 2 MG capsule, Take 1  tablet (2 mg total) by mouth See admin instructions. Take 2 tablets with onset of diarrhea, then take 1 tablet every 2 hours until diarrhea stops. Maximum 8 tablets in 24, Disp: 90 capsule, Rfl: 0   loratadine (CLARITIN) 10 MG tablet, Take 10 mg by mouth daily. , Disp: , Rfl:    LORazepam (ATIVAN) 1 MG tablet, Take 1 mg by mouth 3 (three) times  daily., Disp: , Rfl:    Magnesium 500 MG TABS, Take 500 mg by mouth 2 (two) times daily., Disp: , Rfl:    methocarbamol (ROBAXIN) 500 MG tablet, Take by mouth., Disp: , Rfl:    methotrexate (RHEUMATREX) 2.5 MG tablet, Take 25 mg by mouth every Sunday. 10 tablets once a week, Disp: , Rfl:    metoprolol tartrate (LOPRESSOR) 25 MG tablet, Take 0.5 tablets (12.5 mg total) by mouth as needed., Disp: 30 tablet, Rfl: 0   mupirocin ointment (BACTROBAN) 2 %, Place 1 application into the nose 2 (two) times daily. Use in each nostril twice daily for five (5) days., Disp: 22 g, Rfl: 5   nortriptyline (PAMELOR) 10 MG capsule, Take 30 mg by mouth at bedtime. , Disp: , Rfl:    oxyCODONE (OXY IR/ROXICODONE) 5 MG immediate release tablet, Take 1 tablet (5 mg total) by mouth every 6 (six) hours as needed for moderate pain or severe pain., Disp: 90 tablet, Rfl: 0   phentermine (ADIPEX-P) 37.5 MG tablet, Take 37.5 mg by mouth daily before breakfast. , Disp: , Rfl:    predniSONE (STERAPRED UNI-PAK 21 TAB) 10 MG (21) TBPK tablet, Day 1 take 6 tablets, Day 2 take 5 tablets, Day 3 take 4 tablets, Day 4 take 3 tablets, Day 5 take 2 tablets D6 take 1 tablet, Disp: 1 each, Rfl: 0   pregabalin (LYRICA) 150 MG capsule, Take 150 mg by mouth 2 (two) times daily., Disp: , Rfl:    promethazine (PHENERGAN) 25 MG tablet, Take 1 tablet (25 mg total) by mouth every 8 (eight) hours as needed for nausea or vomiting., Disp: 90 tablet, Rfl: 0   pyridOXINE (VITAMIN B-6) 100 MG tablet, Take 100 mg by mouth daily., Disp: , Rfl:    topiramate (TOPAMAX) 50 MG tablet, Take 50 mg by mouth 2 (two) times daily. , Disp: , Rfl:    vitamin C (ASCORBIC ACID) 500 MG tablet, Take 500 mg by mouth daily., Disp: , Rfl:  No current facility-administered medications for this visit.  Facility-Administered Medications Ordered in Other Visits:    heparin lock flush 100 unit/mL, 500 Units, Intravenous, Once, [name], MD   sodium chloride flush (NS) 0.9 %  injection 10 mL, 10 mL, Intravenous, PRN, [name], MD, 10 mL at 11/19/18 1249   sodium chloride flush (NS) 0.9 % injection 10 mL, 10 mL, Intravenous, Once, [name], MD   Physical exam:  Vitals:   05/26/23 0837  BP: 115/75  Pulse: (!) 104  Resp: 18  Temp: (!) 96.7 F (35.9 C)  Weight: 188 lb 8 oz (85.5 kg)  ECOG 1 Physical Exam Constitutional:      Appearance: Normal appearance.  HENT:     Head: Normocephalic and atraumatic.  Eyes:     General: No scleral icterus. Neck:     Vascular: No JVD.  Cardiovascular:     Rate and Rhythm: Normal rate.  Pulmonary:     Effort: Pulmonary effort is normal. No respiratory distress.     Breath sounds: Normal breath sounds. No wheezing.  Abdominal:  General: There is no distension.     Palpations: Abdomen is soft.  Musculoskeletal:     Cervical back: Normal range of motion.     Comments: Limited right shoulder range of moton due to pain  Lymphadenopathy:     Cervical: No cervical adenopathy.  Skin:    General: Skin is warm.     Findings: No erythema or rash.  Neurological:     Mental Status: She is alert and oriented to person, place, and time. Mental status is at baseline.     Cranial Nerves: No cranial nerve deficit.     Motor: No abnormal muscle tone.  Psychiatric:        Mood and Affect: Mood and affect normal.        Labs     Latest Ref Rng & Units 05/26/2023    8:21 AM  CMP  Glucose 70 - 99 mg/dL 962   BUN 6 - 20 mg/dL 18   Creatinine 9.52 - 1.00 mg/dL 8.41   Sodium 324 - 401 mmol/L 139   Potassium 3.5 - 5.1 mmol/L 3.3   Chloride 98 - 111 mmol/L 107   CO2 22 - 32 mmol/L 24   Calcium 8.9 - 10.3 mg/dL 9.0   Total Protein 6.5 - 8.1 g/dL 7.4   Total Bilirubin 0.3 - 1.2 mg/dL 0.4   Alkaline Phos 38 - 126 U/L 70   AST 15 - 41 U/L 27   ALT 0 - 44 U/L 20       Latest Ref Rng & Units 05/26/2023    8:21 AM  CBC  WBC 4.0 - 10.5 K/uL 7.4   Hemoglobin 12.0 - 15.0 g/dL 02.7   Hematocrit 25.3 - 46.0 % 31.4    Platelets 150 - 400 K/uL 275    RADIOGRAPHIC STUDIES: I have personally reviewed the radiological images as listed and agreed with the findings in the report. NM PET Image Restage (PS) Skull Base to Thigh (F-18 FDG)  Result Date: 04/09/2023 CLINICAL DATA:  Subsequent treatment strategy for breast cancer. EXAM: NUCLEAR MEDICINE PET SKULL BASE TO THIGH TECHNIQUE: 10.04 mCi F-18 FDG was injected intravenously. Full-ring PET imaging was performed from the skull base to thigh after the radiotracer. CT data was obtained and used for attenuation correction and anatomic localization. Fasting blood glucose: 84 mg/dl COMPARISON:  Multiple priors including most recent PET-CT October 05, 2022 FINDINGS: Mediastinal blood pool activity: SUV max 1.9 Liver activity: SUV max NA NECK: Asymmetric right-sided tonsillar FDG avidity max SUV of 6.0. Incidental CT findings: None. CHEST: No hypermetabolic thoracic adenopathy. No hypermetabolic pulmonary nodules or masses. Status post bilateral mastectomies without hypermetabolic chest wall lesion Incidental CT findings: Right chest wall Port-A-Cath with tip in the right atrium. ABDOMEN/PELVIS: No abnormal hypermetabolic activity within the liver, pancreas, adrenal glands, or spleen. No hypermetabolic lymph nodes in the abdomen or pelvis. Similar nodular stranding in the posterior gluteal subcutaneous soft tissues with low-level FDG avidity max SUV of 2.2. Incidental CT findings: Multiple small hyperdense foci in the cecum likely reflects ingested material. Gas fluid levels in the proximal colon. SKELETON: No focal hypermetabolic activity to suggest skeletal metastasis. Incidental CT findings: None. IMPRESSION: 1. Status post bilateral mastectomies without evidence of hypermetabolic local recurrence. 2. No evidence of hypermetabolic metastatic disease. 3. Asymmetric right-sided tonsillar FDG avidity is nonspecific consider further evaluation with direct visualization. 4. Gas fluid  levels in the proximal colon may reflect a diarrheal illness or laxative use. Electronically Signed   By:  Maudry Mayhew M.D.   On: 04/09/2023 09:50

## 2023-05-26 NOTE — Assessment & Plan Note (Signed)
continue Lyrica and nortriptyline symptoms stable 

## 2023-05-26 NOTE — Progress Notes (Signed)
Pt here for follow up. Pt reports that she has been having upper spine pain.  Rash to arms shows no improvement and typically itches more at night.

## 2023-05-26 NOTE — Assessment & Plan Note (Signed)
She does not tolerate oral potassium.  Potassium is stable. 

## 2023-05-29 NOTE — Progress Notes (Unsigned)
Cardiology Office Note    Date:  05/30/2023   ID:  AMELIAMAE HECKEL, DOB 1981/01/26, MRN [id number]  PCP:  Leanna Sato, MD  Cardiologist:  Yvonne Kendall, MD  Electrophysiologist:  None   Chief Complaint: Follow up  History of Present Illness:   Theresa Chaney is a 42 y.o. female with history of recurrent breast cancer with isolated bone metastasis s/p mastectomy and chemoradiation complicated by a mildly reduced LVEF, WCT, paroxysmal SVT, lymphedema, COPD, anemia, migraine disorder, and GERD who presents for follow up of her cardiomyopathy.   She was previously evaluated in 10/2017 for intermittent sinus tachycardia and DOE. At that time echoes in 10/2017 and 04/2018 showed normal LV function and it was ultimately felt her symptoms were secondary to chemotherapy. In 08/2018, she experienced upper extremity swelling with repeat echo at that time showing an EF of 45% with question of anteroseptal hypokinesis and moderate right ventricular enlargement. Upon her primary cardiologist reviewing the echo, it was felt her EF was closer to 50-55%. She was placed on Toprol XL. Lexiscan Myoview in 10/2018 showed a medium defect of moderate severity present in the the basal anterior and mild anterior location, EF 55-65%. This was a very suboptimal study due to breast attenuation artifact as well as extracardiac uptake at the inferior border of the heart. There was reversible anterior wall defect suggestive of ischemia, though it was felt this was likely artifact given the defect did not extend to the apex. She was seen in the office in 11/2018 for follow up and was feeling well outside of intermittent palpitations over the prior couple of weeks with report of heart rates of 150 bpm. Episodes would typically last a few minutes. In this setting, she wore a Zio monitor that showed the predominant rhythm was sinus with an average heart rate of 82 bpm (range 48-175 bpm). Rare PACs and PVCs were noted with a single  atrial run lasting 4 beats. There were no sustained arrhythmias or prolonged pauses. Patient triggered events corresponded to sinus rhythm and artifact. In this setting, her Toprol XL was increased to 25 mg. She underwent echo on 02/19/2019 which showed improvement in her EF to 55-60%, normal diastolic function, and no significant valvular abnormalities. She was seen in the office in 02/2019 for presyncope. Repeat cardiac monitoring at that time showed a predominant rhythm of sinus with an average heart rate of 88 bpm (range 47-181 bpm in sinus), 2 episodes of WCT favored to be NSVT over SVT with aberrancy lasting up to 7 beats, and rare PACs/PVCs. She has continued to undergo periodic echoes for chemotherapy evaluation as outlined below.    She has undergone periodic echocardiograms for chemotherapy monitoring which have demonstrated:      Echo in 10/2017 demonstrated an EF of 60 to 65%, normal wall motion, grade 1 diastolic dysfunction, normal RV systolic function, normal PASP, and no significant valvular abnormalities.   Echo in 04/2018 showed an EF of 55 to 60%, normal wall motion, normal LV diastolic function.   Echo in 08/2018 showed an EF of 45%, diffuse hypokinesis, grade 1 diastolic dysfunction, mild mitral regurgitation, mild biatrial enlargement, and moderately dilated RV.   Echo in 01/2020 demonstrated an EF of 60 to 65%, no regional wall motion abnormalities, normal RV systolic function and ventricular cavity size, and no significant valvular abnormalities.     Echo in 09/2020 continues to show a preserved LV systolic function with an EF of 60 to 65%, grade 1  diastolic dysfunction, normal RV systolic function and ventricular cavity size, and no significant valvular abnormalities.     Echo in 01/2021 demonstrated an EF of 55-60%, no RWMA, normal RV systolic function and ventricular cavity size, and no significant valvular abnormalities.    Echo in 04/2021 showed an EF of 55 to 60%, no regional  wall motion abnormalities, normal RV systolic function and ventricular cavity size, and no significant valvular abnormalities.   Echo in 08/2021 showed a low normal LV systolic function with an EF of 50 to 55%, normal LV diastolic function parameters, normal RV systolic function and ventricular cavity size, and no significant valvular abnormalities.     Echo from 09/28/2021 demonstrated an EF of 45%, global hypokinesis, normal RV systolic function and ventricular cavity size, and trivial mitral regurgitation.     Following noted drop in LV systolic function, trastuzumab and pertuzumab were held.  She was seen in follow-up in 09/2021 with relative hypotension precluding escalation of GDMT.  She underwent repeat echo in 11/2021 which demonstrated an EF of 45 to 50%, normal RV systolic function and ventricular cavity size, and no significant valvular abnormalities.  Given persistent mild LV dysfunction, Toprol was decreased to 12.5 mg and she was initiated on losartan 12.5 mg.  Subsequent repeat echo on 02/25/2022 demonstrated an improvement in her LV systolic function with an EF of 55 to 60%, no regional wall motion abnormalities, grade 1 diastolic dysfunction, normal RV systolic function and ventricular cavity size, no significant valvular abnormalities, and an estimated right atrial pressure of 3 mmHg.     Echo in 05/2022 showed an EF of 50 to 55%, no regional wall motion abnormalities, normal RV systolic function and ventricular cavity size, no significant valvular abnormalities, and an estimated right atrial pressure of 3 mmHg.   With noted lightheadedness/dizziness was subsequently recommended she hold losartan in 06/2022.   Echo from 08/2022 55 to 55%, no regional wall motion abnormalities, grade 1 diastolic dysfunction, normal RV systolic function and ventricular cavity size, trivial mitral regurgitation, and an estimated right atrial pressure of 3 mmHg.   Most recent PET scan from 09/2022 was stable  without evidence of hypermetabolic recurrence or metastatic disease.   Echo from 11/2022 showed an EF of 50-55%, no regional wall motion abnormalities, indeterminate diastolic function, normal RV systolic function and ventricular cavity size, no significant valvular abnormalities, and an estimated right atrial pressure of 3 mmHg.  With ongoing fatigue and positional dizziness, Toprol-XL was held in 11/2022.  She was last seen in the office in 02/2023 and remained without symptoms of angina or cardiac decompensation.  Following discontinuation of metoprolol, she noted an increase in palpitation burden, however this had improved.  Ongoing fatigue persisted.  Given stability in LVEF, different of rechallenge an ARB/beta-blocker was deferred.  Echo from 02/2023 showed an EF of 50 to 55%, no regional wall motion abnormalities, normal LV diastolic function parameters, normal RV systolic function and ventricular cavity size, no significant valvular abnormalities, and an estimated right atrial pressure of 3 mmHg.   She comes in doing well from a cardiac perspective and is without symptoms of angina or cardiac decompensation.  Baseline fatigue is stable.  She remains very active at baseline, resting when she is fatigued.  She has noted some intermittent short-lived palpitations that will typically last for a minute or 2 and resolve without further intervention.  She has not taken any as needed metoprolol.  No progressive orthopnea or early satiety.  No lower extremity swelling.  Weight stable.  Preliminary read from echo earlier this morning with a stable EF of 50 to 55%.   Labs independently reviewed: 05/2023 - potassium 3.3, BUN 18, serum creatinine 0.87, albumin 4.1, AST/ALT normal, Hgb 10.5, PLT 275 03/2023 - magnesium 2.1 01/2023 - TSH normal  Past Medical History:  Diagnosis Date   Anemia    Arthritis    BRCA negative 11/26/2017   Breast cancer (HCC) 10/11/2017   Multifocal, ER positive, PR negative,  HER-2 negative. ypT3 ypN2a 8.7 cm, 4/15 nodes   Cardiomyopathy (HCC)    a. 10/2017 Echo: EF 60-65%, no rwma, Gr1 DD, nl RV size/fxn; b. 04/2018 Echo: EF 55-60%, no rwma, Nl RV size/fxn; c. 08/2018 Echo: EF 45%, diff HK, ? HK of antsept wall. Gr1 DD. Mild MR. Mild LAE/RAE. Mod dil RV.    Chronic bronchitis (HCC) 11/2017   COPD (chronic obstructive pulmonary disease) (HCC)    MILD PER CXR   Depression    Family history of cancer    GERD (gastroesophageal reflux disease)    Headache    MIGRAINES   Heart murmur    ASYMPTOMATIC   Personal history of chemotherapy    current for right breast ca    Past Surgical History:  Procedure Laterality Date   AXILLARY LYMPH NODE DISSECTION Right 03/19/2018   Procedure: AXILLARY LYMPH NODE DISSECTION;  Surgeon: Earline Mayotte, MD;  Location: [facility];  Service: General;  Laterality: Right;   BREAST BIOPSY Right 10/11/2017   12:30 posterior coil clip invasive mammary carcinoma   BREAST BIOPSY Right 10/11/2017   11:30 middle depth ribbon clip DCIS   BREAST BIOPSY Right 10/11/2017   5:30 anterior depth x shape invasive ductal carcinoma   BREAST IMPLANT REMOVAL Right 06/03/2019   Procedure: REMOVAL OF RIGHT BREAST IMPLANTS;  Surgeon: Peggye Form, DO;  Location: [facility];  Service: Plastics;  Laterality: Right;   BREAST RECONSTRUCTION WITH PLACEMENT OF TISSUE EXPANDER AND FLEX HD (ACELLULAR HYDRATED DERMIS) Right 03/19/2018   Procedure: BREAST RECONSTRUCTION WITH PLACEMENT OF TISSUE EXPANDER AND FLEX HD (ACELLULAR HYDRATED DERMIS);  Surgeon: Peggye Form, DO;  Location: [facility];  Service: Plastics;  Laterality: Right;   CARPAL TUNNEL RELEASE Bilateral 2020   CHOLECYSTECTOMY N/A 04/27/2020   Procedure: LAPAROSCOPIC CHOLECYSTECTOMY WITH INTRAOPERATIVE CHOLANGIOGRAM;  Surgeon: Earline Mayotte, MD;  Location: [facility];  Service: General;  Laterality: N/A;   ESOPHAGOGASTRODUODENOSCOPY (EGD) WITH PROPOFOL N/A 04/17/2020   Procedure:  ESOPHAGOGASTRODUODENOSCOPY (EGD) WITH PROPOFOL;  Surgeon: Earline Mayotte, MD;  Location: [facility];  Service: Endoscopy;  Laterality: N/A;  with biopsy   LAPAROSCOPIC BILATERAL SALPINGO OOPHERECTOMY Bilateral 03/19/2018   Procedure: LAPAROSCOPIC BILATERAL SALPINGO OOPHORECTOMY;  Surgeon: Christeen Douglas, MD;  Location: [facility];  Service: Gynecology;  Laterality: Bilateral;   MASTECTOMY Right 03/2018   MASTECTOMY W/ SENTINEL NODE BIOPSY Right 03/19/2018   Procedure: MASTECTOMY WITH SENTINEL LYMPH NODE BIOPSY;  Surgeon: Earline Mayotte, MD;  Location: [facility];  Service: General;  Laterality: Right;   PORT-A-CATH REMOVAL Left 06/03/2019   Procedure: REMOVAL PORT-A-CATH;  Surgeon: Earline Mayotte, MD;  Location: [facility];  Service: General;  Laterality: Left;   PORTACATH PLACEMENT Left 10/24/2017   Procedure: INSERTION PORT-A-CATH;  Surgeon: Earline Mayotte, MD;  Location: [facility];  Service: General;  Laterality: Left;   PORTACATH PLACEMENT Right 09/28/2020   Procedure: INSERTION PORT-A-CATH;  Surgeon: Earline Mayotte, MD;  Location: [facility];  Service: General;  Laterality: Right;   REMOVAL OF TISSUE  EXPANDER AND PLACEMENT OF IMPLANT Right 07/20/2018   Procedure: REMOVAL OF RIGHT BREAST TISSUE EXPANDER AND PLACEMENT OF IMPLANT;  Surgeon: Peggye Form, DO;  Location: [facility];  Service: Plastics;  Laterality: Right;   SIMPLE MASTECTOMY WITH AXILLARY SENTINEL NODE BIOPSY Left 06/03/2019   Procedure: SIMPLE MASTECTOMY LEFT;  Surgeon: Earline Mayotte, MD;  Location: [facility];  Service: General;  Laterality: Left;    Current Medications: Current Meds  Medication Sig   acetaminophen (TYLENOL) 500 MG tablet Take 500 mg by mouth every 6 (six) hours as needed.   albuterol (VENTOLIN HFA) 108 (90 Base) MCG/ACT inhaler Inhale 2 puffs into the lungs every 6 (six) hours as needed for wheezing or shortness of breath.    CALCIUM CARBONATE-VITAMIN D PO Take 600-800 mg  by mouth in the morning, at noon, and at bedtime.   chlorhexidine (PERIDEX) 0.12 % solution Use as directed 15 mLs in the mouth or throat 2 (two) times daily.   cyanocobalamin (,VITAMIN B-12,) 1000 MCG/ML injection Inject 1,000 mcg into the skin every 30 (thirty) days.   cyclobenzaprine (FLEXERIL) 5 MG tablet Take 5 mg by mouth 3 (three) times daily as needed for muscle spasms.   diphenoxylate-atropine (LOMOTIL) 2.5-0.025 MG tablet Take 1 tablet by mouth 4 (four) times daily as needed for diarrhea or loose stools.   escitalopram (LEXAPRO) 20 MG tablet Take 20 mg by mouth daily.   esomeprazole (NEXIUM) 40 MG capsule Take 40 mg by mouth daily before breakfast.    Eszopiclone 3 MG TABS Take 3 mg by mouth at bedtime as needed (sleep).    folic acid (FOLVITE) 1 MG tablet Take 1 mg by mouth daily.   hydroxychloroquine (PLAQUENIL) 200 MG tablet Take 200 mg by mouth daily.   hydrOXYzine (ATARAX) 10 MG tablet Take 1 tablet (10 mg total) by mouth every 6 (six) hours as needed.   ibuprofen (ADVIL) 800 MG tablet Take 800 mg by mouth every 8 (eight) hours as needed for moderate pain.   loperamide (IMODIUM) 2 MG capsule Take 1 tablet (2 mg total) by mouth See admin instructions. Take 2 tablets with onset of diarrhea, then take 1 tablet every 2 hours until diarrhea stops. Maximum 8 tablets in 24   loratadine (CLARITIN) 10 MG tablet Take 10 mg by mouth daily.    LORazepam (ATIVAN) 1 MG tablet Take 1 mg by mouth 3 (three) times daily.   Magnesium 500 MG TABS Take 500 mg by mouth 2 (two) times daily.   methocarbamol (ROBAXIN) 500 MG tablet Take by mouth.   methotrexate (RHEUMATREX) 2.5 MG tablet Take 25 mg by mouth every Sunday. 10 tablets once a week   metoprolol tartrate (LOPRESSOR) 25 MG tablet Take 0.5 tablets (12.5 mg total) by mouth as needed.   mupirocin ointment (BACTROBAN) 2 % Place 1 application into the nose 2 (two) times daily. Use in each nostril twice daily for five (5) days.   nortriptyline  (PAMELOR) 10 MG capsule Take 30 mg by mouth at bedtime.    oxyCODONE (OXY IR/ROXICODONE) 5 MG immediate release tablet Take 1 tablet (5 mg total) by mouth every 6 (six) hours as needed for moderate pain or severe pain.   phentermine (ADIPEX-P) 37.5 MG tablet Take 37.5 mg by mouth daily before breakfast.    predniSONE (STERAPRED UNI-PAK 21 TAB) 10 MG (21) TBPK tablet Day 1 take 6 tablets, Day 2 take 5 tablets, Day 3 take 4 tablets, Day 4 take 3 tablets, Day  5 take 2 tablets D6 take 1 tablet   pregabalin (LYRICA) 150 MG capsule Take 150 mg by mouth 2 (two) times daily.   promethazine (PHENERGAN) 25 MG tablet Take 1 tablet (25 mg total) by mouth every 8 (eight) hours as needed for nausea or vomiting.   pyridOXINE (VITAMIN B-6) 100 MG tablet Take 100 mg by mouth daily.   topiramate (TOPAMAX) 50 MG tablet Take 50 mg by mouth 2 (two) times daily.    vitamin C (ASCORBIC ACID) 500 MG tablet Take 500 mg by mouth daily.    Allergies:   Patient has no known allergies.   Social History   Socioeconomic History   Marital status: Married    Spouse name: Not on file   Number of children: Not on file   Years of education: Not on file   Highest education level: Not on file  Occupational History   Occupation: pharmacy tech    Comment: Acupuncturist [employer]   Tobacco Use   Smoking status: Former    Packs/day: 0.50    Years: 18.00    Additional pack years: 0.00    Total pack years: 9.00    Types: Cigarettes    Start date: 06/21/2018    Quit date: 12/11/2022    Years since quitting: 0.4   Smokeless tobacco: Never  Vaping Use   Vaping Use: Never used  Substance and Sexual Activity   Alcohol use: No   Drug use: No   Sexual activity: Yes    Birth control/protection: Injection, Other-see comments    Comment: has had hysterectomy  Other Topics Concern   Not on file  Social History Narrative   Lives at home with husband and daughter   Social Determinants of Health   Financial  Resource Strain: Not on file  Food Insecurity: Not on file  Transportation Needs: Not on file  Physical Activity: Not on file  Stress: Not on file  Social Connections: Not on file     Family History:  The patient's family history includes Bladder Cancer in her maternal grandmother; Breast cancer in her paternal aunt; Cervical cancer (age of onset: 58) in her maternal aunt; Diabetes in her father; Heart attack (age of onset: 31) in her father; Hyperlipidemia in her father; Hypertension in her father; Lung cancer in her maternal aunt; Melanoma in her maternal aunt and maternal uncle.  ROS:   12-point review of systems is negative unless otherwise noted in the HPI.   EKGs/Labs/Other Studies Reviewed:    Studies reviewed were summarized above. The additional studies were reviewed today:  Limited echo 02/24/2023: 1. Left ventricular ejection fraction, by estimation, is 50 to 55%. Left  ventricular ejection fraction by 3D volume is 52 %. The left ventricle has  low normal function. The left ventricle has no regional wall motion  abnormalities. Left ventricular  diastolic parameters were normal. The average left ventricular global  longitudinal strain is -18.7 %.   2. Right ventricular systolic function is normal. The right ventricular  size is normal.   3. The mitral valve is normal in structure. No evidence of mitral valve  regurgitation. No evidence of mitral stenosis.   4. The aortic valve has an indeterminant number of cusps. Aortic valve  regurgitation is not visualized. No aortic stenosis is present.   5. The inferior vena cava is normal in size with greater than 50%  respiratory variability, suggesting right atrial pressure of 3 mmHg.  __________  Limited echo 11/25/2022: 1.  Left ventricular ejection fraction, by estimation, is 50 to 55%. The  left ventricle has low normal function. The left ventricle has no regional  wall motion abnormalities. Left ventricular diastolic  parameters are  indeterminate. The average left  ventricular global longitudinal strain is -10.5 %. The global longitudinal  strain is abnormal.   2. Right ventricular systolic function is normal. The right ventricular  size is normal.   3. The mitral valve is normal in structure. No evidence of mitral valve  regurgitation. No evidence of mitral stenosis.   4. The aortic valve was not well visualized. Aortic valve regurgitation  is not visualized. No aortic stenosis is present.   5. The inferior vena cava is normal in size with greater than 50%  respiratory variability, suggesting right atrial pressure of 3 mmHg.  ___________   Limited echo 08/23/2022: 1. Left ventricular ejection fraction, by estimation, is 50 to 55%. The  left ventricle has low normal function. The left ventricle has no regional  wall motion abnormalities. Left ventricular diastolic parameters are  consistent with Grade I diastolic  dysfunction (impaired relaxation). The average left ventricular global  longitudinal strain is -15.4 %.   2. Right ventricular systolic function is normal. The right ventricular  size is normal.   3. The mitral valve is normal in structure. Trivial mitral valve  regurgitation. No evidence of mitral stenosis.   4. The aortic valve was not well visualized. Aortic valve regurgitation  is not visualized. No aortic stenosis is present.   5. The inferior vena cava is normal in size with greater than 50%  respiratory variability, suggesting right atrial pressure of 3 mmHg.  __________   Limited echo 05/17/2022: 1. Left ventricular ejection fraction, by estimation, is 50 to 55%. The  left ventricle has low normal function. The left ventricle has no regional  wall motion abnormalities. The average left ventricular global  longitudinal strain is -11.7 %.   2. Right ventricular systolic function is normal. The right ventricular  size is normal.   3. The mitral valve is normal in structure. No  evidence of mitral valve  regurgitation. No evidence of mitral stenosis.   4. The aortic valve is normal in structure. Aortic valve regurgitation is  not visualized. No aortic stenosis is present.   5. The inferior vena cava is normal in size with greater than 50%  respiratory variability, suggesting right atrial pressure of 3 mmHg.   Comparison(s): 02/25/2022-EF 60%-65%.  __________   2D echo 02/25/2022: 1. Left ventricular ejection fraction, by estimation, is 55 to 60%. The  left ventricle has normal function. The left ventricle has no regional  wall motion abnormalities. Left ventricular diastolic parameters are  consistent with Grade I diastolic  dysfunction (impaired relaxation).   2. Right ventricular systolic function is normal. The right ventricular  size is normal.   3. The mitral valve is normal in structure. No evidence of mitral valve  regurgitation. No evidence of mitral stenosis.   4. The aortic valve is normal in structure. Aortic valve regurgitation is  not visualized. No aortic stenosis is present.   5. The inferior vena cava is normal in size with greater than 50%  respiratory variability, suggesting right atrial pressure of 3 mmHg.   Comparison(s): Previous limited echo reported LVEF of 45-50%. __________   Limited echo 11/11/2021: 1. Left ventricular ejection fraction, by estimation, is 45 to 50%. Left  ventricular ejection fraction by 2D MOD biplane is 46.8 %. The left  ventricle  has mildly decreased function.   2. Right ventricular systolic function is normal. The right ventricular  size is normal.   3. The mitral valve is normal in structure. No evidence of mitral valve  regurgitation. __________   2D echo 09/28/2021: 1. Left ventricular ejection fraction, by estimation, is 45%. The left  ventricle has mild to moderately decreased function. The left ventricle  demonstrates global hypokinesis. Left ventricular diastolic parameters  were normal. The average  left  ventricular global longitudinal strain is -15.0 %. The global longitudinal  strain is abnormal.   2. Right ventricular systolic function is normal. The right ventricular  size is normal. Tricuspid regurgitation signal is inadequate for assessing  PA pressure.   3. The mitral valve is normal in structure. Trivial mitral valve  regurgitation. No evidence of mitral stenosis.   4. The aortic valve is tricuspid. Aortic valve regurgitation is not  visualized. No aortic stenosis is present.   Comparison(s): A prior study was performed on 08/31/2021. The left  ventricular function is slightly worse.  __________   2D echo 08/31/2021: 1. Left ventricular ejection fraction, by estimation, is 50 to 55%. The  left ventricle has low normal function. Left ventricular endocardial  border not optimally defined to evaluate regional wall motion. Left  ventricular diastolic parameters were  normal. The average left ventricular global longitudinal strain is -13.4  %. The global longitudinal strain is abnormal.   2. Right ventricular systolic function is normal. The right ventricular  size is normal.   3. The mitral valve is normal in structure. No evidence of mitral valve  regurgitation. No evidence of mitral stenosis.   4. The aortic valve is tricuspid. Aortic valve regurgitation is trivial.  No aortic stenosis is present.  __________   Limited 2D echo 05/05/2021: 1. Left ventricular ejection fraction, by estimation, is 55 to 60%. The  left ventricle has normal function. The left ventricle has no regional  wall motion abnormalities.   2. Right ventricular systolic function is normal. The right ventricular  size is normal.   3. The mitral valve is normal in structure. No evidence of mitral valve  regurgitation.   4. The aortic valve is tricuspid. Aortic valve regurgitation is not  visualized.  __________   Limited 2D echo 01/29/2021:  1. Left ventricular ejection fraction, by estimation, is  55 to 60%. The  left ventricle has normal function. The left ventricle has no regional  wall motion abnormalities. Left ventricular diastolic function could not  be evaluated.   2. Right ventricular systolic function is normal. The right ventricular  size is normal.   3. The mitral valve is normal in structure. Trivial mitral valve  regurgitation.   4. The aortic valve is normal in structure. Aortic valve regurgitation is  not visualized.  __________   Limited 2D echo 09/14/2020: 1. Left ventricular ejection fraction, by estimation, is 60 to 65%. Left  ventricular ejection fraction by PLAX is 68 %. The left ventricle has  normal function. Left ventricular diastolic parameters are consistent with  Grade I diastolic dysfunction  (impaired relaxation).   2. Right ventricular systolic function is normal. The right ventricular  size is normal.   3. The mitral valve was not well visualized. No evidence of mitral valve  regurgitation.   4. Aortic valve regurgitation is not visualized.  __________   2D echo 01/29/2020: 1. Left ventricular ejection fraction, by estimation, is 60 to 65%. The  left ventricle has normal function. The left ventricle  has no regional  wall motion abnormalities. Indeterminate diastolic filling due to E-A  fusion.   2. Right ventricular systolic function is normal. The right ventricular  size is normal.   3. The mitral valve is normal in structure and function. No evidence of  mitral valve regurgitation. No evidence of mitral stenosis.   4. The aortic valve is normal in structure and function. Aortic valve  regurgitation is not visualized. No aortic stenosis is present.  __________   Luci Bank patch 02/2019: The patient was monitored for 7 days. The predominant rhythm was sinus with an average rate of 88 bpm (range 47 to 181 bpm in sinus). Rare PAC's and PVC's were noted. Two episodes of wide-complex tachycardia (favor nonsustained ventricular tachycardia over  supraventricular tachycardia with aberrancy) occurred, lasting up to 7 beats. Patient triggered events correspond to normal sinus rhythm and narrow complex tachycardia (favor sinus tachycardia).   Predominantly sinus rhythm with two brief episodes of wide-complex tachycardia (favor NSVT over SVT with aberrancy).  Rare PAC's and PVC's also noted.  Patient triggered events correspond to sinus rhythm and narrow complex tachycardia (favor sinus tachycardia but cannot exclude atrial tachycardia). __________   Limited 2D echo 02/19/2019: 1. The left ventricle has normal systolic function, with an ejection  fraction of 55-60%. Left ventricular diastolic parameters were normal.   2. The right ventricle has normal systolc function. The cavity was  normal. There is no increase in right ventricular wall thickness.   3. The aortic valve was not well visualized. __________   Luci Bank patch 11/2018: The patient was monitored for 13 days, 8 hours. The predominant rhythm was sinus with an average rate of 82 bpm (range 48 to 175 bpm). Rare PACs and PVCs were noted. A single atrial run lasting 4 beats was noted. There was no sustained arrhythmia or prolonged pause. Patient triggered events correspond to sinus rhythm and artifact.   Predominantly sinus rhythm with rare PACs and PVCs.  Brief atrial run lasting 4 beats noted. __________   Eugenie Birks MPI 10/12/2018: T wave inversion was noted during stress in the III, II, aVF, V3, V2, V4, V5 and V6 leads, and returning to baseline after 1-5 mins of recovery. There was no ST segment deviation noted during stress. Defect 1: There is a medium defect of moderate severity present in the basal anterior and mid anterior location. The left ventricular ejection fraction is normal (55-65%). Very suboptimal study due to breast attenuation artifact as well as extracardiac uptake at the inferior border of the heart. There is a reversible anterior wall defect suggestive of  ischemia. However, I suspect that this is likely artifact given that the defect does not extend to the apex. Correlate clinically. __________   2D echo 08/15/2018: - Left ventricle: The cavity size was mildly dilated. Systolic    function was mildly reduced. The estimated ejection fraction was    45%. Diffuse hypokinesis. Hypokinesis of the anteroseptal    myocardium. Doppler parameters are consistent with abnormal left    ventricular relaxation (grade 1 diastolic dysfunction).  - Aortic valve: Valve area (VTI): 1.88 cm^2. Valve area (Vmax): 1.8    cm^2. Valve area (Vmean): 1.74 cm^2.  - Mitral valve: There was mild regurgitation. Valve area by    continuity equation (using LVOT flow): 2.22 cm^2.  - Left atrium: The atrium was mildly dilated.  - Right ventricle: The cavity size was moderately dilated.  - Right atrium: The atrium was mildly dilated.   Impressions:   - Mild  LV systolic dysfunction with wall motion abnormalities    suggestive of CAD.  __________   2D echo 04/26/2018: - Left ventricle: The cavity size was normal. Wall thickness was    normal. Systolic function was normal. The estimated ejection    fraction was in the range of 55% to 60%. Wall motion was normal;    there were no regional wall motion abnormalities. Left    ventricular diastolic function parameters were normal.  - Pulmonary arteries: Systolic pressure could not be accurately    estimated.  __________   2D echo 10/26/2017: - Left ventricle: The cavity size was normal. Systolic function was    normal. The estimated ejection fraction was in the range of 60%    to 65%. Wall motion was normal; there were no regional wall    motion abnormalities. Doppler parameters are consistent with    abnormal left ventricular relaxation (grade 1 diastolic    dysfunction).  - Left atrium: The atrium was normal in size.  - Right ventricle: Systolic function was normal.  - Pulmonary arteries: Systolic pressure was within  the normal    range.    EKG:  EKG is ordered today.  The EKG ordered today demonstrates sinus tachycardia, 103 bpm, no acute ST-T changes  Recent Labs: 01/20/2023: TSH 1.318 03/24/2023: Magnesium 2.1 05/26/2023: ALT 20; BUN 18; Creatinine, Ser 0.87; Hemoglobin 10.5; Platelets 275; Potassium 3.3; Sodium 139  Recent Lipid Panel No results found for: "CHOL", "TRIG", "HDL", "CHOLHDL", "VLDL", "LDLCALC", "LDLDIRECT"  PHYSICAL EXAM:    VS:  BP 104/68 (BP Location: Left Arm, Patient Position: Sitting, Cuff Size: Normal)   Pulse (!) 103   Ht 5\' 7"  (1.702 m)   Wt 185 lb 12.8 oz (84.3 kg)   LMP  (LMP Unknown) Comment: Ovaries and tubes removed.April 2019  SpO2 98%   BMI 29.10 kg/m   BMI: Body mass index is 29.1 kg/m.  Physical Exam Vitals reviewed.  Constitutional:      Appearance: She is well-developed.  HENT:     Head: Normocephalic and atraumatic.  Eyes:     General:        Right eye: No discharge.        Left eye: No discharge.  Neck:     Vascular: No JVD.  Cardiovascular:     Rate and Rhythm: Normal rate and regular rhythm.     Heart sounds: Normal heart sounds, S1 normal and S2 normal. Heart sounds not distant. No midsystolic click and no opening snap. No murmur heard.    No friction rub.  Pulmonary:     Effort: Pulmonary effort is normal. No respiratory distress.     Breath sounds: Normal breath sounds. No decreased breath sounds, wheezing or rales.  Chest:     Chest wall: No tenderness.  Abdominal:     General: There is no distension.  Musculoskeletal:     Cervical back: Normal range of motion.     Right lower leg: No edema.     Left lower leg: No edema.  Skin:    General: Skin is warm and dry.     Nails: There is no clubbing.  Neurological:     Mental Status: She is alert and oriented to person, place, and time.  Psychiatric:        Speech: Speech normal.        Behavior: Behavior normal.        Thought Content: Thought content normal.  Judgment: Judgment  normal.     Wt Readings from Last 3 Encounters:  05/30/23 185 lb 12.8 oz (84.3 kg)  05/26/23 188 lb 8 oz (85.5 kg)  05/05/23 186 lb 4.6 oz (84.5 kg)     ASSESSMENT & PLAN:   Chemotherapy-induced systolic dysfunction/NICM: Euvolemic and well compensated.  Preliminary read on limited echo performed earlier this morning showed a stable low normal LV systolic function at 50 to 55% off beta-blocker and ARB.  Given stability of LVEF, and in the context of relative hypotension and underlying fatigue, we have elected to defer rechallenge of ARB or beta-blocker.  She prefers to defer escalation of medical therapy at this time.  Should she have recurrence of cardiomyopathy, or develops symptoms of volume overload, would look to escalate GDMT at that time.  Continue active lifestyle.  No indication for standing loop diuretic.  WCT/paroxysmal SVT/palpitations: Overall, symptoms are stable.  Continue as needed Lopressor 12.5 mg as needed for sustained tachypalpitations, she has not needed any of this.  Recurrent breast cancer with isolated bone metastasis: Management per oncology.   Disposition: F/u with Dr. Okey Dupre or an APP in 3 months, after echo.   Medication Adjustments/Labs and Tests Ordered: Current medicines are reviewed at length with the patient today.  Concerns regarding medicines are outlined above. Medication changes, Labs and Tests ordered today are summarized above and listed in the Patient Instructions accessible in Encounters.   Signed, Eula Listen, PA-C 05/30/2023 11:41 AM     [facility] [address] [place], [place] [address] [phone]

## 2023-05-30 ENCOUNTER — Encounter: Payer: Self-pay | Admitting: Physician Assistant

## 2023-05-30 ENCOUNTER — Ambulatory Visit (INDEPENDENT_AMBULATORY_CARE_PROVIDER_SITE_OTHER): Payer: Medicare HMO | Admitting: Physician Assistant

## 2023-05-30 ENCOUNTER — Ambulatory Visit: Payer: Medicare HMO | Attending: Physician Assistant

## 2023-05-30 VITALS — BP 104/68 | HR 103 | Ht 67.0 in | Wt 185.8 lb

## 2023-05-30 DIAGNOSIS — I427 Cardiomyopathy due to drug and external agent: Secondary | ICD-10-CM

## 2023-05-30 DIAGNOSIS — C50411 Malignant neoplasm of upper-outer quadrant of right female breast: Secondary | ICD-10-CM | POA: Diagnosis not present

## 2023-05-30 DIAGNOSIS — R000 Tachycardia, unspecified: Secondary | ICD-10-CM

## 2023-05-30 DIAGNOSIS — T451X5A Adverse effect of antineoplastic and immunosuppressive drugs, initial encounter: Secondary | ICD-10-CM | POA: Diagnosis not present

## 2023-05-30 DIAGNOSIS — I428 Other cardiomyopathies: Secondary | ICD-10-CM

## 2023-05-30 DIAGNOSIS — I471 Supraventricular tachycardia, unspecified: Secondary | ICD-10-CM | POA: Diagnosis not present

## 2023-05-30 DIAGNOSIS — Z170 Estrogen receptor positive status [ER+]: Secondary | ICD-10-CM

## 2023-05-30 LAB — ECHOCARDIOGRAM LIMITED
Calc EF: 46.5 %
S' Lateral: 3.7 cm
Single Plane A2C EF: 51 %
Single Plane A4C EF: 44.4 %

## 2023-05-30 MED ORDER — PERFLUTREN LIPID MICROSPHERE
1.0000 mL | INTRAVENOUS | Status: AC | PRN
Start: 2023-05-30 — End: 2023-05-30
  Administered 2023-05-30: 2 mL via INTRAVENOUS

## 2023-05-30 NOTE — Patient Instructions (Signed)
Medication Instructions:  Your physician recommends that you continue on your current medications as directed. Please refer to the Current Medication list given to you today.  *If you need a refill on your cardiac medications before your next appointment, please call your pharmacy*   Lab Work: none If you have labs (blood work) drawn today and your tests are completely normal, you will receive your results only by: MyChart Message (if you have MyChart) OR A paper copy in the mail If you have any lab test that is abnormal or we need to change your treatment, we will call you to review the results.   Testing/Procedures: Your physician has requested that you have an echocardiogram. Echocardiography is a painless test that uses sound waves to create images of your heart. It provides your doctor with information about the size and shape of your heart and how well your heart's chambers and valves are working.   You may receive an ultrasound enhancing agent through an IV if needed to better visualize your heart during the echo. This procedure takes approximately one hour.  There are no restrictions for this procedure.  This will take place at [address] [address], [place] [address]    Follow-Up: At [facility], you and your health needs are our priority.  As part of our continuing mission to provide you with exceptional heart care, we have created designated Provider Care Teams.  These Care Teams include your primary Cardiologist (physician) and Advanced Practice Providers (APPs -  Physician Assistants and Nurse Practitioners) who all work together to provide you with the care you need, when you need it.  We recommend signing up for the patient portal called "MyChart".  Sign up information is provided on this After Visit Summary.  MyChart is used to connect with patients for Virtual Visits (Telemedicine).  Patients are able to view lab/test results, encounter  notes, upcoming appointments, etc.  Non-urgent messages can be sent to your provider as well.   To learn more about what you can do with MyChart, go to [email or web address].    Your next appointment:   Follow up same day as Echo  Provider:   Eula Listen, PA-C

## 2023-06-01 ENCOUNTER — Other Ambulatory Visit: Payer: Self-pay

## 2023-06-06 ENCOUNTER — Ambulatory Visit: Payer: Medicare HMO | Admitting: Physician Assistant

## 2023-06-09 ENCOUNTER — Inpatient Hospital Stay: Payer: Medicare HMO

## 2023-06-09 ENCOUNTER — Ambulatory Visit: Payer: Medicare HMO

## 2023-06-12 ENCOUNTER — Other Ambulatory Visit: Payer: Medicare HMO

## 2023-06-12 ENCOUNTER — Ambulatory Visit: Payer: Medicare HMO

## 2023-06-16 ENCOUNTER — Inpatient Hospital Stay: Payer: Medicare HMO

## 2023-06-16 ENCOUNTER — Inpatient Hospital Stay: Payer: Medicare HMO | Attending: Oncology

## 2023-06-16 VITALS — BP 100/54 | HR 91 | Resp 16

## 2023-06-16 DIAGNOSIS — C50411 Malignant neoplasm of upper-outer quadrant of right female breast: Secondary | ICD-10-CM | POA: Diagnosis present

## 2023-06-16 DIAGNOSIS — Z5111 Encounter for antineoplastic chemotherapy: Secondary | ICD-10-CM | POA: Diagnosis present

## 2023-06-16 DIAGNOSIS — Z5112 Encounter for antineoplastic immunotherapy: Secondary | ICD-10-CM | POA: Diagnosis present

## 2023-06-16 DIAGNOSIS — Z79899 Other long term (current) drug therapy: Secondary | ICD-10-CM | POA: Insufficient documentation

## 2023-06-16 DIAGNOSIS — Z170 Estrogen receptor positive status [ER+]: Secondary | ICD-10-CM

## 2023-06-16 DIAGNOSIS — C7951 Secondary malignant neoplasm of bone: Secondary | ICD-10-CM | POA: Diagnosis not present

## 2023-06-16 LAB — COMPREHENSIVE METABOLIC PANEL
ALT: 22 U/L (ref 0–44)
AST: 35 U/L (ref 15–41)
Albumin: 4 g/dL (ref 3.5–5.0)
Alkaline Phosphatase: 57 U/L (ref 38–126)
Anion gap: 9 (ref 5–15)
BUN: 21 mg/dL — ABNORMAL HIGH (ref 6–20)
CO2: 21 mmol/L — ABNORMAL LOW (ref 22–32)
Calcium: 8.4 mg/dL — ABNORMAL LOW (ref 8.9–10.3)
Chloride: 109 mmol/L (ref 98–111)
Creatinine, Ser: 0.74 mg/dL (ref 0.44–1.00)
GFR, Estimated: >60 mL/min (ref >60)
Glucose, Bld: 117 mg/dL — ABNORMAL HIGH (ref 70–99)
Potassium: 3.1 mmol/L — ABNORMAL LOW (ref 3.5–5.1)
Sodium: 139 mmol/L (ref 135–145)
Total Bilirubin: 0.3 mg/dL (ref 0.3–1.2)
Total Protein: 6.9 g/dL (ref 6.5–8.1)

## 2023-06-16 LAB — CBC WITH DIFFERENTIAL/PLATELET
Abs Immature Granulocytes: 0.01 10*3/uL (ref 0.00–0.07)
Basophils Absolute: 0 10*3/uL (ref 0.0–0.1)
Basophils Relative: 1 %
Eosinophils Absolute: 0.1 10*3/uL (ref 0.0–0.5)
Eosinophils Relative: 1 %
HCT: 30.7 % — ABNORMAL LOW (ref 36.0–46.0)
Hemoglobin: 10.3 g/dL — ABNORMAL LOW (ref 12.0–15.0)
Immature Granulocytes: 0 %
Lymphocytes Relative: 29 %
Lymphs Abs: 1.7 10*3/uL (ref 0.7–4.0)
MCH: 28.9 pg (ref 26.0–34.0)
MCHC: 33.6 g/dL (ref 30.0–36.0)
MCV: 86.2 fL (ref 80.0–100.0)
Monocytes Absolute: 0.7 10*3/uL (ref 0.1–1.0)
Monocytes Relative: 11 %
Neutro Abs: 3.4 10*3/uL (ref 1.7–7.7)
Neutrophils Relative %: 58 %
Platelets: 277 10*3/uL (ref 150–400)
RBC: 3.56 MIL/uL — ABNORMAL LOW (ref 3.87–5.11)
RDW: 13.5 % (ref 11.5–15.5)
WBC: 5.9 10*3/uL (ref 4.0–10.5)
nRBC: 0 % (ref 0.0–0.2)

## 2023-06-16 MED ORDER — TRASTUZUMAB-ANNS CHEMO 150 MG IV SOLR
6.0000 mg/kg | Freq: Once | INTRAVENOUS | Status: AC
  Administered 2023-06-16: 462 mg via INTRAVENOUS
  Filled 2023-06-16: qty 22

## 2023-06-16 MED ORDER — SODIUM CHLORIDE 0.9 % IV SOLN
Freq: Once | INTRAVENOUS | Status: AC
  Filled 2023-06-16: qty 250

## 2023-06-16 MED ORDER — FULVESTRANT 250 MG/5ML IM SOSY
500.0000 mg | PREFILLED_SYRINGE | INTRAMUSCULAR | Status: DC
  Administered 2023-06-16: 500 mg via INTRAMUSCULAR
  Filled 2023-06-16: qty 10

## 2023-06-16 MED ORDER — SODIUM CHLORIDE 0.9 % IV SOLN
420.0000 mg | Freq: Once | INTRAVENOUS | Status: AC
  Administered 2023-06-16: 420 mg via INTRAVENOUS
  Filled 2023-06-16: qty 14

## 2023-06-16 MED ORDER — HEPARIN SOD (PORK) LOCK FLUSH 100 UNIT/ML IV SOLN
500.0000 [IU] | Freq: Once | INTRAVENOUS | Status: AC | PRN
  Administered 2023-06-16: 500 [IU]
  Filled 2023-06-16: qty 5

## 2023-06-16 NOTE — Patient Instructions (Signed)

## 2023-06-16 NOTE — Progress Notes (Signed)
Pt tolerated treatment without complaints.  VSS.  Pt refused 30 minute post observation.  Pt understands risks.   

## 2023-06-16 NOTE — Patient Instructions (Signed)
[facility]  Discharge Instructions: Thank you for choosing [facility] to provide your oncology and hematology care.  If you have a lab appointment with the Cancer Center, please go directly to the Cancer Center and check in at the registration area.  Wear comfortable clothing and clothing appropriate for easy access to any Portacath or PICC line.   We strive to give you quality time with your provider. You may need to reschedule your appointment if you arrive late (15 or more minutes).  Arriving late affects you and other patients whose appointments are after yours.  Also, if you miss three or more appointments without notifying the office, you may be dismissed from the clinic at the provider's discretion.      For prescription refill requests, have your pharmacy contact our office and allow 72 hours for refills to be completed.    Today you received the following chemotherapy and/or immunotherapy agents- Trastuzumab, Pertuzumab      To help prevent nausea and vomiting after your treatment, we encourage you to take your nausea medication as directed.  BELOW ARE SYMPTOMS THAT SHOULD BE REPORTED IMMEDIATELY: *FEVER GREATER THAN 100.4 F (38 C) OR HIGHER *CHILLS OR SWEATING *NAUSEA AND VOMITING THAT IS NOT CONTROLLED WITH YOUR NAUSEA MEDICATION *UNUSUAL SHORTNESS OF BREATH *UNUSUAL BRUISING OR BLEEDING *URINARY PROBLEMS (pain or burning when urinating, or frequent urination) *BOWEL PROBLEMS (unusual diarrhea, constipation, pain near the anus) TENDERNESS IN MOUTH AND THROAT WITH OR WITHOUT PRESENCE OF ULCERS (sore throat, sores in mouth, or a toothache) UNUSUAL RASH, SWELLING OR PAIN  UNUSUAL VAGINAL DISCHARGE OR ITCHING   Items with * indicate a potential emergency and should be followed up as soon as possible or go to the Emergency Department if any problems should occur.  Please show the CHEMOTHERAPY ALERT CARD or IMMUNOTHERAPY ALERT CARD  at check-in to the Emergency Department and triage nurse.  Should you have questions after your visit or need to cancel or reschedule your appointment, please contact [facility]  [phone] and follow the prompts.  Office hours are 8:00 a.m. to 4:30 p.m. Monday - Friday. Please note that voicemails left after 4:00 p.m. may not be returned until the following business day.  We are closed weekends and major holidays. You have access to a nurse at all times for urgent questions. Please call the main number to the clinic [phone] and follow the prompts.  For any non-urgent questions, you may also contact your provider using MyChart. We now offer e-Visits for anyone 18 and older to request care online for non-urgent symptoms. For details visit [email or web address].   Also download the MyChart app! Go to the app store, search "MyChart", open the app, select [facility], and log in with your MyChart username and password.    

## 2023-06-22 ENCOUNTER — Encounter: Payer: Self-pay | Admitting: Gastroenterology

## 2023-06-23 ENCOUNTER — Ambulatory Visit: Payer: Medicare HMO | Admitting: Anesthesiology

## 2023-06-23 ENCOUNTER — Encounter: Payer: Self-pay | Admitting: Gastroenterology

## 2023-06-23 ENCOUNTER — Ambulatory Visit: Payer: Medicare HMO

## 2023-06-23 ENCOUNTER — Encounter
Admission: RE | Disposition: A | Discharge status: Home / Self Care | Payer: Self-pay | Source: Home / Self Care | Attending: Gastroenterology

## 2023-06-23 ENCOUNTER — Ambulatory Visit
Admission: RE | Admit: 2023-06-23 | Discharge: 2023-06-23 | Disposition: A | Discharge status: Home / Self Care | Payer: Medicare HMO | Attending: Gastroenterology | Admitting: Gastroenterology

## 2023-06-23 DIAGNOSIS — R194 Change in bowel habit: Secondary | ICD-10-CM

## 2023-06-23 DIAGNOSIS — Z853 Personal history of malignant neoplasm of breast: Secondary | ICD-10-CM | POA: Insufficient documentation

## 2023-06-23 DIAGNOSIS — Z9221 Personal history of antineoplastic chemotherapy: Secondary | ICD-10-CM | POA: Insufficient documentation

## 2023-06-23 DIAGNOSIS — K529 Noninfective gastroenteritis and colitis, unspecified: Secondary | ICD-10-CM | POA: Insufficient documentation

## 2023-06-23 DIAGNOSIS — F32A Depression, unspecified: Secondary | ICD-10-CM | POA: Insufficient documentation

## 2023-06-23 DIAGNOSIS — D649 Anemia, unspecified: Secondary | ICD-10-CM | POA: Diagnosis not present

## 2023-06-23 DIAGNOSIS — Z87891 Personal history of nicotine dependence: Secondary | ICD-10-CM | POA: Diagnosis not present

## 2023-06-23 DIAGNOSIS — K219 Gastro-esophageal reflux disease without esophagitis: Secondary | ICD-10-CM | POA: Diagnosis not present

## 2023-06-23 DIAGNOSIS — J449 Chronic obstructive pulmonary disease, unspecified: Secondary | ICD-10-CM | POA: Insufficient documentation

## 2023-06-23 DIAGNOSIS — K2970 Gastritis, unspecified, without bleeding: Secondary | ICD-10-CM | POA: Insufficient documentation

## 2023-06-23 DIAGNOSIS — R109 Unspecified abdominal pain: Secondary | ICD-10-CM | POA: Diagnosis not present

## 2023-06-23 DIAGNOSIS — Z79899 Other long term (current) drug therapy: Secondary | ICD-10-CM | POA: Diagnosis not present

## 2023-06-23 DIAGNOSIS — M1990 Unspecified osteoarthritis, unspecified site: Secondary | ICD-10-CM | POA: Insufficient documentation

## 2023-06-23 DIAGNOSIS — K2950 Unspecified chronic gastritis without bleeding: Secondary | ICD-10-CM | POA: Insufficient documentation

## 2023-06-23 HISTORY — PX: ESOPHAGOGASTRODUODENOSCOPY (EGD) WITH PROPOFOL: SHX5813

## 2023-06-23 HISTORY — PX: COLONOSCOPY WITH PROPOFOL: SHX5780

## 2023-06-23 HISTORY — PX: BIOPSY: SHX5522

## 2023-06-23 SURGERY — COLONOSCOPY WITH PROPOFOL
Anesthesia: General

## 2023-06-23 MED ORDER — PROPOFOL 1000 MG/100ML IV EMUL
INTRAVENOUS | Status: AC
  Filled 2023-06-23: qty 100

## 2023-06-23 MED ORDER — LIDOCAINE HCL (CARDIAC) PF 100 MG/5ML IV SOSY
PREFILLED_SYRINGE | INTRAVENOUS | Status: DC | PRN
  Administered 2023-06-23: 50 mg via INTRAVENOUS

## 2023-06-23 MED ORDER — SODIUM CHLORIDE 0.9 % IV SOLN
INTRAVENOUS | Status: DC
  Administered 2023-06-23: 20 mL/h via INTRAVENOUS

## 2023-06-23 MED ORDER — PROPOFOL 10 MG/ML IV BOLUS: INTRAVENOUS | Status: DC | PRN

## 2023-06-23 MED ORDER — PROPOFOL 10 MG/ML IV BOLUS
INTRAVENOUS | Status: DC | PRN
Start: 2023-06-23 — End: 2023-06-23
  Administered 2023-06-23: 100 mg via INTRAVENOUS
  Administered 2023-06-23: 150 ug/kg/min via INTRAVENOUS

## 2023-06-23 MED ORDER — LIDOCAINE HCL (PF) 2 % IJ SOLN
INTRAMUSCULAR | Status: AC
  Filled 2023-06-23: qty 5

## 2023-06-23 NOTE — Anesthesia Preprocedure Evaluation (Signed)
Anesthesia Evaluation  Patient identified by MRN, date of birth, ID band Patient awake    Reviewed: Allergy & Precautions, NPO status , Patient's Chart, lab work & pertinent test results  Airway Mallampati: III  TM Distance: >3 FB Neck ROM: Full    Dental  (+) Teeth Intact   Pulmonary neg pulmonary ROS, COPD,  COPD inhaler, Patient abstained from smoking., former smoker   Pulmonary exam normal  + decreased breath sounds      Cardiovascular Exercise Tolerance: Good negative cardio ROS Normal cardiovascular exam Rhythm:Regular Rate:Normal     Neuro/Psych  Headaches   Depression    negative neurological ROS  negative psych ROS   GI/Hepatic negative GI ROS, Neg liver ROS,GERD  Medicated,,  Endo/Other  negative endocrine ROS    Renal/GU negative Renal ROS  negative genitourinary   Musculoskeletal  (+) Arthritis ,    Abdominal   Peds negative pediatric ROS (+)  Hematology negative hematology ROS (+) Blood dyscrasia, anemia   Anesthesia Other Findings Past Medical History: No date: Anemia No date: Arthritis 11/26/2017: BRCA negative 10/11/2017: Breast cancer (HCC)     Comment:  Multifocal, ER positive, PR negative, HER-2 negative.               ypT3 ypN2a 8.7 cm, 4/15 nodes No date: Cardiomyopathy [facility])     Comment:  a. 10/2017 Echo: EF 60-65%, no rwma, Gr1 DD, nl RV               size/fxn; b. 04/2018 Echo: EF 55-60%, no rwma, Nl RV               size/fxn; c. 08/2018 Echo: EF 45%, diff HK, ? HK of               antsept wall. Gr1 DD. Mild MR. Mild LAE/RAE. Mod dil RV.  11/2017: Chronic bronchitis (HCC) No date: COPD (chronic obstructive pulmonary disease) (HCC)     Comment:  MILD PER CXR No date: Depression No date: Family history of cancer No date: GERD (gastroesophageal reflux disease) No date: Headache     Comment:  MIGRAINES No date: Heart murmur     Comment:  ASYMPTOMATIC No date: Personal history of  chemotherapy     Comment:  current for right breast ca  Past Surgical History: 03/19/2018: AXILLARY LYMPH NODE DISSECTION; Right     Comment:  Procedure: AXILLARY LYMPH NODE DISSECTION;  Surgeon:               Earline Mayotte, MD;  Location: [facility] ORS;  Service:               General;  Laterality: Right; 10/11/2017: BREAST BIOPSY; Right     Comment:  12:30 posterior coil clip invasive mammary carcinoma 10/11/2017: BREAST BIOPSY; Right     Comment:  11:30 middle depth ribbon clip DCIS 10/11/2017: BREAST BIOPSY; Right     Comment:  5:30 anterior depth x shape invasive ductal carcinoma 06/03/2019: BREAST IMPLANT REMOVAL; Right     Comment:  Procedure: REMOVAL OF RIGHT BREAST IMPLANTS;  Surgeon:               Peggye Form, DO;  Location: [facility];  Service:               Plastics;  Laterality: Right; 03/19/2018: BREAST RECONSTRUCTION WITH PLACEMENT OF TISSUE EXPANDER AND  FLEX HD (ACELLULAR HYDRATED DERMIS); Right     Comment:  Procedure: BREAST RECONSTRUCTION WITH PLACEMENT OF  TISSUE EXPANDER AND FLEX HD (ACELLULAR HYDRATED DERMIS);               Surgeon: Peggye Form, DO;  Location: [facility];                Service: Plastics;  Laterality: Right; 2020: CARPAL TUNNEL RELEASE; Bilateral 04/27/2020: CHOLECYSTECTOMY; N/A     Comment:  Procedure: LAPAROSCOPIC CHOLECYSTECTOMY WITH               INTRAOPERATIVE CHOLANGIOGRAM;  Surgeon: Earline Mayotte, MD;  Location: [facility];  Service: General;                Laterality: N/A; 04/17/2020: ESOPHAGOGASTRODUODENOSCOPY (EGD) WITH PROPOFOL; N/A     Comment:  Procedure: ESOPHAGOGASTRODUODENOSCOPY (EGD) WITH               PROPOFOL;  Surgeon: Earline Mayotte, MD;  Location:               [facility];  Service: Endoscopy;  Laterality: N/A;                with biopsy 03/19/2018: LAPAROSCOPIC BILATERAL SALPINGO OOPHERECTOMY; Bilateral     Comment:  Procedure: LAPAROSCOPIC BILATERAL SALPINGO OOPHORECTOMY;               Surgeon: Christeen Douglas, MD;  Location: [facility];                Service: Gynecology;  Laterality: Bilateral; 03/2018: MASTECTOMY; Right 03/19/2018: MASTECTOMY W/ SENTINEL NODE BIOPSY; Right     Comment:  Procedure: MASTECTOMY WITH SENTINEL LYMPH NODE BIOPSY;                Surgeon: Earline Mayotte, MD;  Location: [facility];                Service: General;  Laterality: Right; 06/03/2019: PORT-A-CATH REMOVAL; Left     Comment:  Procedure: REMOVAL PORT-A-CATH;  Surgeon: Earline Mayotte, MD;  Location: [facility];  Service: General;                Laterality: Left; 10/24/2017: PORTACATH PLACEMENT; Left     Comment:  Procedure: INSERTION PORT-A-CATH;  Surgeon: Earline Mayotte, MD;  Location: [facility];  Service: General;                Laterality: Left; 09/28/2020: PORTACATH PLACEMENT; Right     Comment:  Procedure: INSERTION PORT-A-CATH;  Surgeon: Earline Mayotte, MD;  Location: [facility];  Service: General;                Laterality: Right; 07/20/2018: REMOVAL OF TISSUE EXPANDER AND PLACEMENT OF IMPLANT; Right     Comment:  Procedure: REMOVAL OF RIGHT BREAST TISSUE EXPANDER AND               PLACEMENT OF IMPLANT;  Surgeon: Peggye Form, DO;              Location: [facility];  Service: Plastics;               Laterality: Right; 06/03/2019: SIMPLE MASTECTOMY WITH AXILLARY SENTINEL NODE BIOPSY; Left  Comment:  Procedure: SIMPLE MASTECTOMY LEFT;  Surgeon: Earline Mayotte, MD;  Location: [facility];  Service: General;                Laterality: Left;  BMI    Body Mass Index: 28.72 kg/m      Reproductive/Obstetrics negative OB ROS                             Anesthesia Physical Anesthesia Plan  ASA: 3  Anesthesia Plan: General   Post-op Pain Management:    Induction: Intravenous  PONV Risk Score and Plan: Propofol infusion and TIVA  Airway Management  Planned: Natural Airway  Additional Equipment:   Intra-op Plan:   Post-operative Plan:   Informed Consent: I have reviewed the patients History and Physical, chart, labs and discussed the procedure including the risks, benefits and alternatives for the proposed anesthesia with the patient or authorized representative who has indicated his/her understanding and acceptance.     Dental Advisory Given  Plan Discussed with: CRNA and Surgeon  Anesthesia Plan Comments:        Anesthesia Quick Evaluation

## 2023-06-23 NOTE — Transfer of Care (Signed)
Immediate Anesthesia Transfer of Care Note  Patient: Theresa Chaney  Procedure(s) Performed: COLONOSCOPY WITH PROPOFOL ESOPHAGOGASTRODUODENOSCOPY (EGD) WITH PROPOFOL  Patient Location: PACU and Endoscopy Unit  Anesthesia Type:General  Level of Consciousness: awake  Airway & Oxygen Therapy: Patient Spontanous Breathing  Post-op Assessment: Report given to RN  Post vital signs: stable  Last Vitals:  Vitals Value Taken Time  BP 126/57 06/23/23 1003  Temp 35.8 C 06/23/23 1001  Pulse 101 06/23/23 1004  Resp 26 06/23/23 1004  SpO2 98 % 06/23/23 1004  Vitals shown include unfiled device data.  Last Pain:  Vitals:   06/23/23 1001  TempSrc: Temporal  PainSc: 0-No pain         Complications: No notable events documented.

## 2023-06-23 NOTE — Anesthesia Postprocedure Evaluation (Signed)
Anesthesia Post Note  Patient: KEYUNA NAEGLE  Procedure(s) Performed: COLONOSCOPY WITH PROPOFOL ESOPHAGOGASTRODUODENOSCOPY (EGD) WITH PROPOFOL  Patient location during evaluation: PACU Anesthesia Type: General Level of consciousness: awake and awake and alert Pain management: pain level controlled Vital Signs Assessment: post-procedure vital signs reviewed and stable Respiratory status: spontaneous breathing and nonlabored ventilation Cardiovascular status: blood pressure returned to baseline Anesthetic complications: no   No notable events documented.   Last Vitals:  Vitals:   06/23/23 1001 06/23/23 1011  BP: (!) 126/57 (!) 134/96  Pulse:    Resp:    Temp: (!) 35.8 C   SpO2: 95%     Last Pain:  Vitals:   06/23/23 1011  TempSrc:   PainSc: 0-No pain                 VAN STAVEREN,Armina Galloway

## 2023-06-23 NOTE — H&P (Signed)
Wyline Mood, MD [address], [address], [place], [place], [address] [address], [address], [place], [place], [address] Phone: [phone]  Fax: [phone]  Primary Care Physician:  Leanna Sato, MD   Pre-Procedure History & Physical: HPI:  Theresa Chaney is a 42 y.o. female is here for an endoscopy and colonoscopy    Past Medical History:  Diagnosis Date   Anemia    Arthritis    BRCA negative 11/26/2017   Breast cancer (HCC) 10/11/2017   Multifocal, ER positive, PR negative, HER-2 negative. ypT3 ypN2a 8.7 cm, 4/15 nodes   Cardiomyopathy (HCC)    a. 10/2017 Echo: EF 60-65%, no rwma, Gr1 DD, nl RV size/fxn; b. 04/2018 Echo: EF 55-60%, no rwma, Nl RV size/fxn; c. 08/2018 Echo: EF 45%, diff HK, ? HK of antsept wall. Gr1 DD. Mild MR. Mild LAE/RAE. Mod dil RV.    Chronic bronchitis (HCC) 11/2017   COPD (chronic obstructive pulmonary disease) (HCC)    MILD PER CXR   Depression    Family history of cancer    GERD (gastroesophageal reflux disease)    Headache    MIGRAINES   Heart murmur    ASYMPTOMATIC   Personal history of chemotherapy    current for right breast ca    Past Surgical History:  Procedure Laterality Date   AXILLARY LYMPH NODE DISSECTION Right 03/19/2018   Procedure: AXILLARY LYMPH NODE DISSECTION;  Surgeon: Earline Mayotte, MD;  Location: [facility];  Service: General;  Laterality: Right;   BREAST BIOPSY Right 10/11/2017   12:30 posterior coil clip invasive mammary carcinoma   BREAST BIOPSY Right 10/11/2017   11:30 middle depth ribbon clip DCIS   BREAST BIOPSY Right 10/11/2017   5:30 anterior depth x shape invasive ductal carcinoma   BREAST IMPLANT REMOVAL Right 06/03/2019   Procedure: REMOVAL OF RIGHT BREAST IMPLANTS;  Surgeon: Peggye Form, DO;  Location: [facility];  Service: Plastics;  Laterality: Right;   BREAST RECONSTRUCTION WITH PLACEMENT OF TISSUE EXPANDER AND FLEX HD (ACELLULAR HYDRATED DERMIS) Right 03/19/2018   Procedure: BREAST  RECONSTRUCTION WITH PLACEMENT OF TISSUE EXPANDER AND FLEX HD (ACELLULAR HYDRATED DERMIS);  Surgeon: Peggye Form, DO;  Location: [facility];  Service: Plastics;  Laterality: Right;   CARPAL TUNNEL RELEASE Bilateral 2020   CHOLECYSTECTOMY N/A 04/27/2020   Procedure: LAPAROSCOPIC CHOLECYSTECTOMY WITH INTRAOPERATIVE CHOLANGIOGRAM;  Surgeon: Earline Mayotte, MD;  Location: [facility];  Service: General;  Laterality: N/A;   ESOPHAGOGASTRODUODENOSCOPY (EGD) WITH PROPOFOL N/A 04/17/2020   Procedure: ESOPHAGOGASTRODUODENOSCOPY (EGD) WITH PROPOFOL;  Surgeon: Earline Mayotte, MD;  Location: [facility];  Service: Endoscopy;  Laterality: N/A;  with biopsy   LAPAROSCOPIC BILATERAL SALPINGO OOPHERECTOMY Bilateral 03/19/2018   Procedure: LAPAROSCOPIC BILATERAL SALPINGO OOPHORECTOMY;  Surgeon: Christeen Douglas, MD;  Location: [facility];  Service: Gynecology;  Laterality: Bilateral;   MASTECTOMY Right 03/2018   MASTECTOMY W/ SENTINEL NODE BIOPSY Right 03/19/2018   Procedure: MASTECTOMY WITH SENTINEL LYMPH NODE BIOPSY;  Surgeon: Earline Mayotte, MD;  Location: [facility];  Service: General;  Laterality: Right;   PORT-A-CATH REMOVAL Left 06/03/2019   Procedure: REMOVAL PORT-A-CATH;  Surgeon: Earline Mayotte, MD;  Location: [facility];  Service: General;  Laterality: Left;   PORTACATH PLACEMENT Left 10/24/2017   Procedure: INSERTION PORT-A-CATH;  Surgeon: Earline Mayotte, MD;  Location: [facility];  Service: General;  Laterality: Left;   PORTACATH PLACEMENT Right 09/28/2020   Procedure: INSERTION PORT-A-CATH;  Surgeon: Earline Mayotte, MD;  Location:  [facility];  Service: General;  Laterality: Right;   REMOVAL OF TISSUE EXPANDER AND PLACEMENT OF IMPLANT Right 07/20/2018   Procedure: REMOVAL OF RIGHT BREAST TISSUE EXPANDER AND PLACEMENT OF IMPLANT;  Surgeon: Peggye Form, DO;  Location: [facility];  Service: Plastics;  Laterality: Right;   SIMPLE MASTECTOMY WITH AXILLARY SENTINEL NODE  BIOPSY Left 06/03/2019   Procedure: SIMPLE MASTECTOMY LEFT;  Surgeon: Earline Mayotte, MD;  Location: [facility];  Service: General;  Laterality: Left;    Prior to Admission medications   Medication Sig Start Date End Date Taking? Authorizing Provider  acetaminophen (TYLENOL) 500 MG tablet Take 500 mg by mouth every 6 (six) hours as needed.   Yes [provider]  albuterol (VENTOLIN HFA) 108 (90 Base) MCG/ACT inhaler Inhale 2 puffs into the lungs every 6 (six) hours as needed for wheezing or shortness of breath.    Yes [provider]  CALCIUM CARBONATE-VITAMIN D PO Take 600-800 mg by mouth in the morning, at noon, and at bedtime.   Yes [provider]  chlorhexidine (PERIDEX) 0.12 % solution Use as directed 15 mLs in the mouth or throat 2 (two) times daily. 05/27/22  Yes [name], MD  cyanocobalamin (,VITAMIN B-12,) 1000 MCG/ML injection Inject 1,000 mcg into the skin every 30 (thirty) days. 09/02/19  Yes [provider]  cyclobenzaprine (FLEXERIL) 5 MG tablet Take 5 mg by mouth 3 (three) times daily as needed for muscle spasms. 08/06/19  Yes [provider]  diphenoxylate-atropine (LOMOTIL) 2.5-0.025 MG tablet Take 1 tablet by mouth 4 (four) times daily as needed for diarrhea or loose stools. 10/28/20  Yes [name], MD  escitalopram (LEXAPRO) 20 MG tablet Take 20 mg by mouth daily.   Yes [provider]  esomeprazole (NEXIUM) 40 MG capsule Take 40 mg by mouth daily before breakfast.    Yes [provider]  Eszopiclone 3 MG TABS Take 3 mg by mouth at bedtime as needed (sleep).  08/21/20  Yes [provider]  folic acid (FOLVITE) 1 MG tablet Take 1 mg by mouth daily. 09/02/19  Yes [provider]  hydrOXYzine (ATARAX) 10 MG tablet Take 1 tablet (10 mg total) by mouth every 6 (six) hours as needed. 05/26/23  Yes [name], MD  ibuprofen (ADVIL) 800 MG tablet Take 800 mg by mouth every 8 (eight) hours as needed for moderate  pain.   Yes [provider]  loperamide (IMODIUM) 2 MG capsule Take 1 tablet (2 mg total) by mouth See admin instructions. Take 2 tablets with onset of diarrhea, then take 1 tablet every 2 hours until diarrhea stops. Maximum 8 tablets in 24 08/19/22  Yes [name], MD  LORazepam (ATIVAN) 1 MG tablet Take 1 mg by mouth 3 (three) times daily.   Yes [provider]  Magnesium 500 MG TABS Take 500 mg by mouth 2 (two) times daily.   Yes [provider]  methocarbamol (ROBAXIN) 500 MG tablet Take by mouth. 01/10/23  Yes [provider]  methotrexate (RHEUMATREX) 2.5 MG tablet Take 25 mg by mouth every Sunday. 10 tablets once a week 09/02/19  Yes [provider]  metoprolol tartrate (LOPRESSOR) 25 MG tablet Take 0.5 tablets (12.5 mg total) by mouth as needed. 02/24/23  Yes Dunn, Raymon Mutton, PA-C  mupirocin ointment (BACTROBAN) 2 % Place 1 application into the nose 2 (two) times daily. Use in each nostril twice daily for five (5) days. 05/27/21  Yes [name],  MD  nortriptyline (PAMELOR) 10 MG capsule Take 30 mg by mouth at bedtime.    Yes [provider]  oxyCODONE (OXY IR/ROXICODONE) 5 MG immediate release tablet Take 1 tablet (5 mg total) by mouth every 6 (six) hours as needed for moderate pain or severe pain. 05/11/23  Yes [name], MD  phentermine (ADIPEX-P) 37.5 MG tablet Take 37.5 mg by mouth daily before breakfast.  09/17/19  Yes [provider]  predniSONE (STERAPRED UNI-PAK 21 TAB) 10 MG (21) TBPK tablet Day 1 take 6 tablets, Day 2 take 5 tablets, Day 3 take 4 tablets, Day 4 take 3 tablets, Day 5 take 2 tablets D6 take 1 tablet 04/14/23  Yes [name], MD  pregabalin (LYRICA) 150 MG capsule Take 150 mg by mouth 2 (two) times daily. 07/27/20  Yes [provider]  promethazine (PHENERGAN) 25 MG tablet Take 1 tablet (25 mg total) by mouth every 8 (eight) hours as needed for nausea or vomiting. 02/15/22  Yes [name], MD  pyridOXINE (VITAMIN B-6) 100 MG  tablet Take 100 mg by mouth daily.   Yes [provider]  topiramate (TOPAMAX) 50 MG tablet Take 50 mg by mouth 2 (two) times daily.  11/06/19  Yes [provider]  vitamin C (ASCORBIC ACID) 500 MG tablet Take 500 mg by mouth daily.   Yes [provider]  hydroxychloroquine (PLAQUENIL) 200 MG tablet Take 200 mg by mouth daily. 06/08/21   [provider]  loratadine (CLARITIN) 10 MG tablet Take 10 mg by mouth daily.     [provider]    Allergies as of 04/26/2023   (No Known Allergies)    Family History  Problem Relation Age of Onset   Diabetes Father    Hypertension Father    Hyperlipidemia Father    Heart attack Father 76       "mild"   Melanoma Maternal Aunt        other aunts with BCC/SCC/Melanoma   Cervical cancer Maternal Aunt 73       daughter w/ cervical cancer as well   Lung cancer Maternal Aunt    Melanoma Maternal Uncle        other uncles with BCC/SCC/Melanoma   Breast cancer Paternal Aunt    Bladder Cancer Maternal Grandmother     Social History   Socioeconomic History   Marital status: Married    Spouse name: Not on file   Number of children: Not on file   Years of education: Not on file   Highest education level: Not on file  Occupational History   Occupation: pharmacy tech    Comment: [employer]ist [employer]   Tobacco Use   Smoking status: Former    Current packs/day: 0.00    Average packs/day: 0.5 packs/day for 18.0 years (9.0 ttl pk-yrs)    Types: Cigarettes    Start date: 06/21/2018    Quit date: 12/11/2022    Years since quitting: 0.5   Smokeless tobacco: Never  Vaping Use   Vaping status: Never Used  Substance and Sexual Activity   Alcohol use: No   Drug use: No   Sexual activity: Yes    Birth control/protection: Injection, Other-see comments    Comment: has had hysterectomy  Other Topics Concern   Not on file  Social History Narrative   Lives at home with husband and daughter    Social Determinants of Health   Financial Resource Strain: Not on file  Food Insecurity:  Not on file  Transportation Needs: Not on file  Physical Activity: Not on file  Stress: Not on file  Social Connections: Not on file  Intimate Partner Violence: Not on file    Review of Systems: See HPI, otherwise negative ROS  Physical Exam: BP 111/72   Pulse 96   Temp (!) 96.7 F (35.9 C) (Temporal)   Resp 20   Ht 5\' 7"  (1.702 m)   Wt 83.2 kg   LMP  (LMP Unknown) Comment: Ovaries and tubes removed.April 2019  SpO2 100%   BMI 28.72 kg/m  General:   Alert,  pleasant and cooperative in NAD Head:  Normocephalic and atraumatic. Neck:  Supple; no masses or thyromegaly. Lungs:  Clear throughout to auscultation, normal respiratory effort.    Heart:  +S1, +S2, Regular rate and rhythm, No edema. Abdomen:  Soft, nontender and nondistended. Normal bowel sounds, without guarding, and without rebound.   Neurologic:  Alert and  oriented x4;  grossly normal neurologically.  Impression/Plan: Theresa Chaney is here for an endoscopy and colonoscopy  to be performed for  evaluation of anemia,diarrhea    Risks, benefits, limitations, and alternatives regarding endoscopy have been reviewed with the patient.  Questions have been answered.  All parties agreeable.   Wyline Mood, MD  06/23/2023, 8:45 AM

## 2023-06-23 NOTE — Op Note (Signed)
[facility] Patient Name: Theresa Chaney Procedure Date: 06/23/2023 9:09 AM MRN: [id number] Account #: [id number] Date of Birth: [birth year] Admit Type: Outpatient Age: 42 Room: [facility] ENDO ROOM 4 Gender: Female Note Status: Finalized Instrument Name: Prentice Docker [id number] Procedure:             Colonoscopy Indications:           Chronic diarrhea Providers:             Wyline Mood MD, MD Medicines:             Monitored Anesthesia Care Complications:         No immediate complications. Procedure:             Pre-Anesthesia Assessment:                        - Prior to the procedure, a History and Physical was                         performed, and patient medications, allergies and                         sensitivities were reviewed. The patient's tolerance                         of previous anesthesia was reviewed.                        - The risks and benefits of the procedure and the                         sedation options and risks were discussed with the                         patient. All questions were answered and informed                         consent was obtained.                        - After reviewing the risks and benefits, the patient                         was deemed in satisfactory condition to undergo the                         procedure.                        - ASA Grade Assessment: II - A patient with mild                         systemic disease.                        After obtaining informed consent, the colonoscope was                         passed under direct vision. Throughout the procedure,  the patient's blood pressure, pulse, and oxygen                         saturations were monitored continuously. The                         Colonoscope was introduced through the anus and                         advanced to the the terminal ileum. The colonoscopy                         was performed with  ease. The patient tolerated the                         procedure well. The quality of the bowel preparation                         was excellent. The terminal ileum was photographed. Findings:      The perianal and digital rectal examinations were normal.      The terminal ileum appeared normal. Biopsies were taken with a cold       forceps for histology.      The colon (entire examined portion) appeared normal. Biopsies were taken       with a cold forceps for histology.      The exam was otherwise without abnormality on direct and retroflexion       views. Impression:            - The examined portion of the ileum was normal.                         Biopsied.                        - The entire examined colon is normal. Biopsied.                        - The examination was otherwise normal on direct and                         retroflexion views. Recommendation:        - Discharge patient to home (with escort).                        - Resume previous diet.                        - Continue present medications.                        - Await pathology results.                        - Return to GI office as previously scheduled. Procedure Code(s):     --- Professional ---                        [phone], Colonoscopy, flexible; with biopsy, single or  multiple Diagnosis Code(s):     --- Professional ---                        K52.9, Noninfective gastroenteritis and colitis,                         unspecified CPT copyright 2022 American Medical Association. All rights reserved. The codes documented in this report are preliminary and upon coder review may  be revised to meet current compliance requirements. Wyline Mood, MD Wyline Mood MD, MD 06/23/2023 9:58:58 AM This report has been signed electronically. Number of Addenda: 0 Note Initiated On: 06/23/2023 9:09 AM Scope Withdrawal Time: 0 hours 8 minutes 20 seconds  Total Procedure Duration: 0 hours 15 minutes  36 seconds  Estimated Blood Loss:  Estimated blood loss: none.      [facility]

## 2023-06-23 NOTE — Op Note (Signed)
[facility] Patient Name: Theresa Chaney Procedure Date: 06/23/2023 9:09 AM MRN: [id number] Account #: [id number] Date of Birth: [birth year] Admit Type: Outpatient Age: 42 Room: [facility] ENDO ROOM 4 Gender: Female Note Status: Finalized Instrument Name: Patton Salles Endoscope [id number] Procedure:             Upper GI endoscopy Indications:           Abdominal pain Providers:             Wyline Mood MD, MD Medicines:             Monitored Anesthesia Care Complications:         No immediate complications. Procedure:             Pre-Anesthesia Assessment:                        - Prior to the procedure, a History and Physical was                         performed, and patient medications, allergies and                         sensitivities were reviewed. The patient's tolerance                         of previous anesthesia was reviewed.                        - The risks and benefits of the procedure and the                         sedation options and risks were discussed with the                         patient. All questions were answered and informed                         consent was obtained.                        - ASA Grade Assessment: II - A patient with mild                         systemic disease.                        After obtaining informed consent, the endoscope was                         passed under direct vision. Throughout the procedure,                         the patient's blood pressure, pulse, and oxygen                         saturations were monitored continuously. The Endoscope                         was introduced through the mouth, and advanced to the  third part of duodenum. The upper GI endoscopy was                         accomplished with ease. The patient tolerated the                         procedure well. Findings:      The esophagus was normal.      Diffuse moderate inflammation characterized by  congestion (edema) and       erythema was found on the greater curvature of the stomach. Biopsies       were taken with a cold forceps for histology.      The examined duodenum was normal. Biopsies were taken with a cold       forceps for histology.      The cardia and gastric fundus were normal on retroflexion. Impression:            - Normal esophagus.                        - Gastritis. Biopsied.                        - Normal examined duodenum. Biopsied. Recommendation:        - Await pathology results.                        - Perform a colonoscopy today. Procedure Code(s):     --- Professional ---                        [id number], Esophagogastroduodenoscopy, flexible,                         transoral; with biopsy, single or multiple Diagnosis Code(s):     --- Professional ---                        K29.70, Gastritis, unspecified, without bleeding                        R10.9, Unspecified abdominal pain CPT copyright 2022 American Medical Association. All rights reserved. The codes documented in this report are preliminary and upon coder review may  be revised to meet current compliance requirements. Wyline Mood, MD Wyline Mood MD, MD 06/23/2023 9:37:47 AM This report has been signed electronically. Number of Addenda: 0 Note Initiated On: 06/23/2023 9:09 AM Estimated Blood Loss:  Estimated blood loss: none.      [facility]

## 2023-06-28 NOTE — Progress Notes (Signed)
Bx showed only gastritis otherwise was normal

## 2023-07-05 ENCOUNTER — Encounter: Payer: Self-pay | Admitting: Oncology

## 2023-07-06 ENCOUNTER — Other Ambulatory Visit: Payer: Self-pay

## 2023-07-06 ENCOUNTER — Encounter: Payer: Self-pay | Admitting: Oncology

## 2023-07-06 ENCOUNTER — Other Ambulatory Visit: Payer: Self-pay | Admitting: Oncology

## 2023-07-06 DIAGNOSIS — C50811 Malignant neoplasm of overlapping sites of right female breast: Secondary | ICD-10-CM

## 2023-07-06 NOTE — Telephone Encounter (Signed)
FYI

## 2023-07-06 NOTE — Telephone Encounter (Signed)
Please cancel [facility]om[facility] on 8/2. Keep lab/ [facility] inj on 8/2. Pt will get updated AVS tomorrow.

## 2023-07-07 ENCOUNTER — Encounter: Payer: Self-pay | Admitting: Oncology

## 2023-07-07 ENCOUNTER — Inpatient Hospital Stay: Payer: Medicare HMO

## 2023-07-07 ENCOUNTER — Ambulatory Visit: Payer: Medicare HMO

## 2023-07-07 ENCOUNTER — Inpatient Hospital Stay (HOSPITAL_BASED_OUTPATIENT_CLINIC_OR_DEPARTMENT_OTHER): Payer: Medicare HMO | Admitting: Oncology

## 2023-07-07 VITALS — BP 107/62 | HR 104 | Resp 18

## 2023-07-07 VITALS — BP 98/68 | HR 118 | Temp 97.0°F | Resp 18 | Wt 168.0 lb

## 2023-07-07 DIAGNOSIS — M069 Rheumatoid arthritis, unspecified: Secondary | ICD-10-CM

## 2023-07-07 DIAGNOSIS — Z5111 Encounter for antineoplastic chemotherapy: Secondary | ICD-10-CM | POA: Diagnosis not present

## 2023-07-07 DIAGNOSIS — G620 Drug-induced polyneuropathy: Secondary | ICD-10-CM | POA: Diagnosis not present

## 2023-07-07 DIAGNOSIS — Z5112 Encounter for antineoplastic immunotherapy: Secondary | ICD-10-CM

## 2023-07-07 DIAGNOSIS — I428 Other cardiomyopathies: Secondary | ICD-10-CM | POA: Diagnosis not present

## 2023-07-07 DIAGNOSIS — Z170 Estrogen receptor positive status [ER+]: Secondary | ICD-10-CM

## 2023-07-07 DIAGNOSIS — C50811 Malignant neoplasm of overlapping sites of right female breast: Secondary | ICD-10-CM | POA: Diagnosis not present

## 2023-07-07 DIAGNOSIS — L0102 Bockhart's impetigo: Secondary | ICD-10-CM

## 2023-07-07 DIAGNOSIS — T451X5A Adverse effect of antineoplastic and immunosuppressive drugs, initial encounter: Secondary | ICD-10-CM

## 2023-07-07 DIAGNOSIS — C50411 Malignant neoplasm of upper-outer quadrant of right female breast: Secondary | ICD-10-CM

## 2023-07-07 DIAGNOSIS — C7951 Secondary malignant neoplasm of bone: Secondary | ICD-10-CM

## 2023-07-07 DIAGNOSIS — L739 Follicular disorder, unspecified: Secondary | ICD-10-CM

## 2023-07-07 DIAGNOSIS — E876 Hypokalemia: Secondary | ICD-10-CM

## 2023-07-07 LAB — COMPREHENSIVE METABOLIC PANEL
ALT: 18 U/L (ref 0–44)
AST: 26 U/L (ref 15–41)
Albumin: 4.1 g/dL (ref 3.5–5.0)
Alkaline Phosphatase: 67 U/L (ref 38–126)
Anion gap: 8 (ref 5–15)
BUN: 14 mg/dL (ref 6–20)
CO2: 21 mmol/L — ABNORMAL LOW (ref 22–32)
Calcium: 9.2 mg/dL (ref 8.9–10.3)
Chloride: 109 mmol/L (ref 98–111)
Creatinine, Ser: 0.92 mg/dL (ref 0.44–1.00)
GFR, Estimated: >60 mL/min (ref >60)
Glucose, Bld: 141 mg/dL — ABNORMAL HIGH (ref 70–99)
Potassium: 3.1 mmol/L — ABNORMAL LOW (ref 3.5–5.1)
Sodium: 138 mmol/L (ref 135–145)
Total Bilirubin: 0.4 mg/dL (ref 0.3–1.2)
Total Protein: 7.5 g/dL (ref 6.5–8.1)

## 2023-07-07 LAB — CBC WITH DIFFERENTIAL/PLATELET
Abs Immature Granulocytes: 0.06 10*3/uL (ref 0.00–0.07)
Basophils Absolute: 0.1 10*3/uL (ref 0.0–0.1)
Basophils Relative: 0 %
Eosinophils Absolute: 0.1 10*3/uL (ref 0.0–0.5)
Eosinophils Relative: 1 %
HCT: 31.5 % — ABNORMAL LOW (ref 36.0–46.0)
Hemoglobin: 10.4 g/dL — ABNORMAL LOW (ref 12.0–15.0)
Immature Granulocytes: 0 %
Lymphocytes Relative: 16 %
Lymphs Abs: 2.4 10*3/uL (ref 0.7–4.0)
MCH: 28.4 pg (ref 26.0–34.0)
MCHC: 33 g/dL (ref 30.0–36.0)
MCV: 86.1 fL (ref 80.0–100.0)
Monocytes Absolute: 1.3 10*3/uL — ABNORMAL HIGH (ref 0.1–1.0)
Monocytes Relative: 9 %
Neutro Abs: 11.3 10*3/uL — ABNORMAL HIGH (ref 1.7–7.7)
Neutrophils Relative %: 74 %
Platelets: 295 10*3/uL (ref 150–400)
RBC: 3.66 MIL/uL — ABNORMAL LOW (ref 3.87–5.11)
RDW: 13.1 % (ref 11.5–15.5)
WBC: 15.2 10*3/uL — ABNORMAL HIGH (ref 4.0–10.5)
nRBC: 0 % (ref 0.0–0.2)

## 2023-07-07 LAB — VITAMIN B12: Vitamin B-12: 524 pg/mL (ref 180–914)

## 2023-07-07 MED ORDER — TRASTUZUMAB-ANNS CHEMO 150 MG IV SOLR
6.0000 mg/kg | Freq: Once | INTRAVENOUS | Status: AC
  Administered 2023-07-07: 462 mg via INTRAVENOUS
  Filled 2023-07-07: qty 22

## 2023-07-07 MED ORDER — DOXYCYCLINE HYCLATE 100 MG PO TABS: 100.0000 mg | ORAL_TABLET | Freq: Two times a day (BID) | ORAL | 0 refills | Status: DC

## 2023-07-07 MED ORDER — SODIUM CHLORIDE 0.9 % IV SOLN
420.0000 mg | Freq: Once | INTRAVENOUS | Status: AC
  Administered 2023-07-07: 420 mg via INTRAVENOUS
  Filled 2023-07-07: qty 14

## 2023-07-07 MED ORDER — SODIUM CHLORIDE 0.9 % IV SOLN
Freq: Once | INTRAVENOUS | Status: AC
  Filled 2023-07-07: qty 250

## 2023-07-07 MED ORDER — HEPARIN SOD (PORK) LOCK FLUSH 100 UNIT/ML IV SOLN
500.0000 [IU] | Freq: Once | INTRAVENOUS | Status: AC | PRN
  Administered 2023-07-07: 500 [IU]
  Filled 2023-07-07: qty 5

## 2023-07-07 NOTE — Patient Instructions (Signed)
[facility]  Discharge Instructions: Thank you for choosing [facility] to provide your oncology and hematology care.  If you have a lab appointment with the Cancer Center, please go directly to the Cancer Center and check in at the registration area.  Wear comfortable clothing and clothing appropriate for easy access to any Portacath or PICC line.   We strive to give you quality time with your provider. You may need to reschedule your appointment if you arrive late (15 or more minutes).  Arriving late affects you and other patients whose appointments are after yours.  Also, if you miss three or more appointments without notifying the office, you may be dismissed from the clinic at the provider's discretion.      For prescription refill requests, have your pharmacy contact our office and allow 72 hours for refills to be completed.    Today you received the following chemotherapy and/or immunotherapy agents- Trastuzumab, Pertuzumab      To help prevent nausea and vomiting after your treatment, we encourage you to take your nausea medication as directed.  BELOW ARE SYMPTOMS THAT SHOULD BE REPORTED IMMEDIATELY: *FEVER GREATER THAN 100.4 F (38 C) OR HIGHER *CHILLS OR SWEATING *NAUSEA AND VOMITING THAT IS NOT CONTROLLED WITH YOUR NAUSEA MEDICATION *UNUSUAL SHORTNESS OF BREATH *UNUSUAL BRUISING OR BLEEDING *URINARY PROBLEMS (pain or burning when urinating, or frequent urination) *BOWEL PROBLEMS (unusual diarrhea, constipation, pain near the anus) TENDERNESS IN MOUTH AND THROAT WITH OR WITHOUT PRESENCE OF ULCERS (sore throat, sores in mouth, or a toothache) UNUSUAL RASH, SWELLING OR PAIN  UNUSUAL VAGINAL DISCHARGE OR ITCHING   Items with * indicate a potential emergency and should be followed up as soon as possible or go to the Emergency Department if any problems should occur.  Please show the CHEMOTHERAPY ALERT CARD or IMMUNOTHERAPY ALERT CARD  at check-in to the Emergency Department and triage nurse.  Should you have questions after your visit or need to cancel or reschedule your appointment, please contact [facility]  [phone] and follow the prompts.  Office hours are 8:00 a.m. to 4:30 p.m. Monday - Friday. Please note that voicemails left after 4:00 p.m. may not be returned until the following business day.  We are closed weekends and major holidays. You have access to a nurse at all times for urgent questions. Please call the main number to the clinic [phone] and follow the prompts.  For any non-urgent questions, you may also contact your provider using MyChart. We now offer e-Visits for anyone 27 and older to request care online for non-urgent symptoms. For details visit [email or web address].   Also download the MyChart app! Go to the app store, search "MyChart", open the app, select [facility], and log in with your MyChart username and password.

## 2023-07-07 NOTE — Assessment & Plan Note (Signed)
continue Lyrica and nortriptyline symptoms stable 

## 2023-07-07 NOTE — Assessment & Plan Note (Signed)
Recommend doxycycline 100mg  BID x 5 days.

## 2023-07-07 NOTE — Assessment & Plan Note (Signed)
Follow up with  rheumatology.  Patient is on methotrexate,  plaquenil.  

## 2023-07-07 NOTE — Progress Notes (Signed)
Pt tolerated treatment without complaints.  VSS.  Pt refused 30 minute post observation.  Pt understands risks.   

## 2023-07-07 NOTE — Assessment & Plan Note (Addendum)
Chronic tachycardia Stable low-normal LVEF 50-55%. Follow up with cardiology. She gets ECHO Q3 months. 

## 2023-07-07 NOTE — Progress Notes (Signed)
Hematology/Oncology Progress note [phone] [phone]    REASON FOR VISIT Follow up for  treatment of breast cancer   ASSESSMENT & PLAN:   Cancer Staging  Breast cancer of upper-outer quadrant of right female breast (HCC) Staging form: Breast, AJCC 8th Edition - Clinical: G2, ER+, PR+, HER2- - Signed by [name], MD on 04/05/2018 - Pathologic stage from 04/04/2018: No Stage Recommended (ypT3, pN2, cM0, G3, ER+, PR-, HER2-) - Signed by [name], MD on 04/04/2018 - Pathologic: Stage IV (rpTX, pNX, cM1, ER+, PR-, HER2+) - Signed by [name], MD on 12/10/2020   Breast cancer of upper-outer quadrant of right female breast (HCC) Recurrent breast cancer with isolated bone metastasis, stage IV #Initially stage IIIA right breast cancer, ER positive, HER-2 negative status post bilateral oophorectomy,Right mastectomy and right axillary lymph node dissection, status post implant, and implant removal.  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 ER positive, HER-2 positive  .- s/p Palliative radiation to T7  Labs reviewed and discussed Proceed with Kanjinti Perjecta.   Continue fulvestrant monthly.  Nonischemic cardiomyopathy (HCC)  Chronic tachycardia Stable low-normal LVEF 50-55%. Follow up with cardiology. She gets ECHO Q3 months.  Neuropathy due to chemotherapeutic drug (HCC) continue Lyrica and nortriptyline symptoms stable  Rheumatoid arthritis (HCC) Follow up with  rheumatology.  Patient is on methotrexate,  plaquenil.   Hypocalcemia Continue calcium supplementation 600mg  TID.    Encounter for monoclonal antibody treatment for malignancy Antibody treatment plan as list above.  Malignant neoplasm metastatic to bone [facility]) status post radiation. Continue Zometa monthly, hold if Calcium is <8.6  - hold due to recent bone fracture Continue calcium supplementation. Continue  oxycodone as needed.    Hypokalemia She does not tolerate oral potassium.  Potassium is stable.  Folliculitis and perifolliculitis Recommend doxycycline 100mg  BID x 5 days.    Orders Placed This Encounter  Procedures   Comprehensive metabolic panel    Standing Status:   Future    Standing Expiration Date:   07/27/2024   CBC with Differential    Standing Status:   Future    Standing Expiration Date:   07/27/2024   Comprehensive metabolic panel    Standing Status:   Future    Standing Expiration Date:   08/17/2024   CBC with Differential    Standing Status:   Future    Standing Expiration Date:   08/17/2024   Follow up  lab Kanjinti Perjecta 3 weeks.  lab MD Kanjinti Perjecta 6 weeks.  fulvestrant/Zometa Q 4 weeks   All questions were answered. The patient knows to call the clinic with any problems, questions or concerns.  [name], MD, PhD [facility] Hematology Oncology 07/07/2023       Oncology History  Oncology History Overview Note        Malignant neoplasm of overlapping sites of right breast in female, estrogen receptor positive (HCC)  Breast cancer of upper-outer quadrant of right female breast (HCC)  10/19/2017 Initial Diagnosis   Malignant neoplasm of overlapping sites of right breast in female, estrogen receptor positive (HCC)   11/08/2017 - 01/03/2018 Chemotherapy   Neoadjuvant ddAC x 4 + 1 cycle of Taxol  due to lack of response, surgery was offered.   03/19/2018 Surgery   S/p right mastectomy and right axillary dissection, immediate breast reconstruction with placement of expanders.  ypT3 ypN2, + lymphovascular invasion,  Grade 3, margin is negative, close. ER 90%, PR  0%, HER2 IHC negative.   # elective bilateral salpingo-oophorectomy..    06/11/2018 Imaging   s/p 11 cycles Taxol adjuvantly   10/10/2018 -  Radiation Therapy   adjuvant right chest wall radiation   06/03/2019 Surgery   underwent elective left prophylactic mastectomy and sentinel lymph  node biopsy of left axilla Pathology negative for malignancy  # Mediport removal  # right implant removal on    07/07/2020 Imaging   MRI thoracic spine without contrast showed lesions involving the T7 posterior elements most concerning for metastatic lesion.  No evidence of epidural tumor.  Minimal thoracic spondylosis without stenosis. MRI was reviewed by me and a PET scan was obtained for further evaluation   07/20/2020 Imaging   PET scan showed hypermetabolic metastasis involving the posterior element of T7, no additional evidence of metastasis in the neck, chest, abdomen or pelvis.   07/29/2020 Procedure   T7 lesion biopsy showed metastatic carcinoma, compatible with breast origin.  Receptor status staining showed ER 71-80% positive, PR negative, HER-2 positive IHC 3+   08/31/2020 -  Radiation Therapy    finished spine radiation    09/17/2020 -  Chemotherapy   Patient is on Treatment Plan :  BREAST Trastuzumab + Pertuzumab q21d     12/10/2020 Cancer Staging   Staging form: Breast, AJCC 8th Edition - Pathologic: Stage IV (rpTX, pNX, cM1, ER+, PR-, HER2+) - Signed by [name], MD on 12/10/2020   03/15/2021 Imaging    PET scan showed no focal hypermetabolic activity to suggest skeletal metastasis.  Mild hypermetabolic activity along the right T7-8 paraspinal musculature.  Max SUV 3.3.  Likely postprocedural-   09/28/2021 Echocardiogram   further decrease of LVEF to 45%   10/14/2021 Imaging   PET  Post bilateral mastectomy and RIGHT axillary dissection without signs of recurrent or metastatic disease.   11/12/2021 Echocardiogram   LVEF of 45-50%.   02/18/2022 Imaging   MRI thoracic and lumbar spine w wo contrast  1. Negative MRIs of the thoracic and lumbar spine. No evidence for locally recurrent metastasis at the level of T7. No other new metastatic disease elsewhere with thoracolumbar spine. 2. No significant disc pathology, stenosis, or evidence for neural impingement.     02/25/2022 Echocardiogram   LVEF 55-60%.   03/16/2022 Imaging   PET showed Stable PET-CT. No findings for local recurrent breast cancer,locoregional adenopathy or distant metastatic disease.    07/20/2022 Imaging   MRI Thoracic Spine w wo contrast 1. Interval resolution of the increased T2 signal contrast enhancement in the posterior elements of T7. No new lesion identified. 2. No spinal canal or neural foraminal stenosis.   08/23/2022 Echocardiogram   Stable low-normal LVEF at 50-55%.   10/05/2022 Imaging   PET restaging Stable examination without evidence of hypermetabolic recurrence or metastatic disease.   Persistent minimally metabolic stranding in the posterior bilateral gluteal soft tissues may reflect sequela of subcutaneous injections   02/24/2023 Echocardiogram   Echocardiogram showed stable low normal LVEF 50-55%.   04/07/2023 Imaging   PET scan  1. Status post bilateral mastectomies without evidence of hypermetabolic local recurrence. 2. No evidence of hypermetabolic metastatic disease. 3. Asymmetric right-sided tonsillar FDG avidity is nonspecific consider further evaluation with direct visualization. 4. Gas fluid levels in the proximal colon may reflect a diarrheal illness or laxative use.      INTERVAL HISTORY 42 yo female with above oncology history reviewed by me presents for follow-up of management of metastatic Triple positive breast cancer.  Chronic  back pain, unchanged.  Neck/upper back pain for 1 week Intermittent diarrhea, manageable.  No fever or chills. + arthralgia + "bug bite" on her left flank, redness. Non tender or itchy.  + "right ankle broken" she will see ortho next week.    Review of Systems  Constitutional:  Negative for chills, fever, malaise/fatigue and weight loss.  HENT:  Negative for sore throat.        Hair loss  Eyes:  Negative for redness.  Respiratory:  Negative for cough, shortness of breath and wheezing.   Cardiovascular:   Negative for chest pain, palpitations and leg swelling.  Gastrointestinal:  Negative for abdominal pain, blood in stool, heartburn, nausea and vomiting.  Genitourinary:  Negative for dysuria.  Musculoskeletal:  Positive for back pain and joint pain.  Skin:  Positive for rash.  Neurological:  Positive for tingling. Negative for dizziness and tremors.  Endo/Heme/Allergies:  Does not bruise/bleed easily.  Psychiatric/Behavioral:  Negative for hallucinations. The patient does not have insomnia.     Not on File  Patient Active Problem List   Diagnosis Date Noted   Breast cancer of upper-outer quadrant of right female breast (HCC) 03/19/2018    Priority: High   Folliculitis and perifolliculitis 04/14/2023    Priority: Medium    Hypokalemia 06/03/2022    Priority: Medium    Malignant neoplasm metastatic to bone (HCC) 03/25/2021    Priority: Medium    Encounter for monoclonal antibody treatment for malignancy 12/10/2020    Priority: Medium    Hypocalcemia 11/19/2020    Priority: Medium    Neuropathy due to chemotherapeutic drug (HCC) 08/11/2020    Priority: Medium    Rheumatoid arthritis (HCC) 10/13/2019    Priority: Medium    Nonischemic cardiomyopathy (HCC) 08/23/2018    Priority: Medium    Abnormal bowel movement 04/14/2023    Priority: Low   Right shoulder pain 01/20/2023    Priority: Low   Thyroid nodule 06/04/2022    Priority: Low   Encounter for antineoplastic chemotherapy 10/29/2020    Priority: Low   Goals of care, counseling/discussion 08/11/2020    Priority: Low   Gastroesophageal reflux disease without esophagitis 02/24/2017    Priority: Low   Anemia 06/23/2023   Change in bowel habits 06/23/2023   Tachycardia, unspecified 04/15/2021   Bone lesion 11/19/2020   Inflammatory arthritis 11/19/2020   HER2-positive carcinoma of breast (HCC) 08/11/2020   Bilateral hand swelling 10/03/2019   Fracture of neck of metacarpal bone 05/14/2019   Chronic fatigue 04/09/2019    Polyarthralgia 04/09/2019   Status post right breast reconstruction 02/26/2019   Status post right mastectomy 02/26/2019   Mastalgia 02/15/2019   Shortness of breath 08/23/2018   Preprocedural cardiovascular examination 08/23/2018   Tachycardia 08/23/2018   Palpitations 08/23/2018   Estrogen receptor positive status (ER+) 04/04/2018   Acquired absence of right breast and nipple 04/03/2018   Family history of cancer    Malignant neoplasm of overlapping sites of right breast in female, estrogen receptor positive (HCC) 10/19/2017   Generalized anxiety disorder 10/03/2014   Headache 10/03/2014     Past Medical History:  Diagnosis Date   Anemia    Arthritis    BRCA negative 11/26/2017   Breast cancer (HCC) 10/11/2017   Multifocal, ER positive, PR negative, HER-2 negative. ypT3 ypN2a 8.7 cm, 4/15 nodes   Cardiomyopathy (HCC)    a. 10/2017 Echo: EF 60-65%, no rwma, Gr1 DD, nl RV size/fxn; b. 04/2018 Echo: EF 55-60%, no rwma, Nl RV  size/fxn; c. 08/2018 Echo: EF 45%, diff HK, ? HK of antsept wall. Gr1 DD. Mild MR. Mild LAE/RAE. Mod dil RV.    Chronic bronchitis (HCC) 11/2017   COPD (chronic obstructive pulmonary disease) (HCC)    MILD PER CXR   Depression    Family history of cancer    GERD (gastroesophageal reflux disease)    Headache    MIGRAINES   Heart murmur    ASYMPTOMATIC   Personal history of chemotherapy    current for right breast ca     Past Surgical History:  Procedure Laterality Date   AXILLARY LYMPH NODE DISSECTION Right 03/19/2018   Procedure: AXILLARY LYMPH NODE DISSECTION;  Surgeon: Earline Mayotte, MD;  Location: [facility] ORS;  Service: General;  Laterality: Right;   BIOPSY  06/23/2023   Procedure: BIOPSY;  Surgeon: Wyline Mood, MD;  Location: [facility];  Service: Gastroenterology;;   BREAST BIOPSY Right 10/11/2017   12:30 posterior coil clip invasive mammary carcinoma   BREAST BIOPSY Right 10/11/2017   11:30 middle depth ribbon clip DCIS   BREAST BIOPSY  Right 10/11/2017   5:30 anterior depth x shape invasive ductal carcinoma   BREAST IMPLANT REMOVAL Right 06/03/2019   Procedure: REMOVAL OF RIGHT BREAST IMPLANTS;  Surgeon: Peggye Form, DO;  Location: [facility] ORS;  Service: Plastics;  Laterality: Right;   BREAST RECONSTRUCTION WITH PLACEMENT OF TISSUE EXPANDER AND FLEX HD (ACELLULAR HYDRATED DERMIS) Right 03/19/2018   Procedure: BREAST RECONSTRUCTION WITH PLACEMENT OF TISSUE EXPANDER AND FLEX HD (ACELLULAR HYDRATED DERMIS);  Surgeon: Peggye Form, DO;  Location: [facility] ORS;  Service: Plastics;  Laterality: Right;   CARPAL TUNNEL RELEASE Bilateral 2020   CHOLECYSTECTOMY N/A 04/27/2020   Procedure: LAPAROSCOPIC CHOLECYSTECTOMY WITH INTRAOPERATIVE CHOLANGIOGRAM;  Surgeon: Earline Mayotte, MD;  Location: [facility] ORS;  Service: General;  Laterality: N/A;   COLONOSCOPY WITH PROPOFOL N/A 06/23/2023   Procedure: COLONOSCOPY WITH PROPOFOL;  Surgeon: Wyline Mood, MD;  Location: [facility] ENDOSCOPY;  Service: Gastroenterology;  Laterality: N/A;   ESOPHAGOGASTRODUODENOSCOPY (EGD) WITH PROPOFOL N/A 04/17/2020   Procedure: ESOPHAGOGASTRODUODENOSCOPY (EGD) WITH PROPOFOL;  Surgeon: Earline Mayotte, MD;  Location: [facility] ENDOSCOPY;  Service: Endoscopy;  Laterality: N/A;  with biopsy   ESOPHAGOGASTRODUODENOSCOPY (EGD) WITH PROPOFOL N/A 06/23/2023   Procedure: ESOPHAGOGASTRODUODENOSCOPY (EGD) WITH PROPOFOL;  Surgeon: Wyline Mood, MD;  Location: [facility] ENDOSCOPY;  Service: Gastroenterology;  Laterality: N/A;   LAPAROSCOPIC BILATERAL SALPINGO OOPHERECTOMY Bilateral 03/19/2018   Procedure: LAPAROSCOPIC BILATERAL SALPINGO OOPHORECTOMY;  Surgeon: Christeen Douglas, MD;  Location: [facility] ORS;  Service: Gynecology;  Laterality: Bilateral;   MASTECTOMY Right 03/2018   MASTECTOMY W/ SENTINEL NODE BIOPSY Right 03/19/2018   Procedure: MASTECTOMY WITH SENTINEL LYMPH NODE BIOPSY;  Surgeon: Earline Mayotte, MD;  Location: [facility] ORS;  Service: General;  Laterality: Right;   PORT-A-CATH  REMOVAL Left 06/03/2019   Procedure: REMOVAL PORT-A-CATH;  Surgeon: Earline Mayotte, MD;  Location: [facility];  Service: General;  Laterality: Left;   PORTACATH PLACEMENT Left 10/24/2017   Procedure: INSERTION PORT-A-CATH;  Surgeon: Earline Mayotte, MD;  Location: [facility];  Service: General;  Laterality: Left;   PORTACATH PLACEMENT Right 09/28/2020   Procedure: INSERTION PORT-A-CATH;  Surgeon: Earline Mayotte, MD;  Location: [facility] ORS;  Service: General;  Laterality: Right;   REMOVAL OF TISSUE EXPANDER AND PLACEMENT OF IMPLANT Right 07/20/2018   Procedure: REMOVAL OF RIGHT BREAST TISSUE EXPANDER AND PLACEMENT OF IMPLANT;  Surgeon: Peggye Form, DO;  Location:  SURGERY CENTER;  Service: Plastics;  Laterality:  Right;   SIMPLE MASTECTOMY WITH AXILLARY SENTINEL NODE BIOPSY Left 06/03/2019   Procedure: SIMPLE MASTECTOMY LEFT;  Surgeon: Earline Mayotte, MD;  Location: [facility] ORS;  Service: General;  Laterality: Left;    Social History   Socioeconomic History   Marital status: Married    Spouse name: Not on file   Number of children: Not on file   Years of education: Not on file   Highest education level: Not on file  Occupational History   Occupation: pharmacy tech    Comment: [employer]up[employer]ist [employer]   Tobacco Use   Smoking status: Former    Current packs/day: 0.00    Average packs/day: 0.5 packs/day for 18.0 years (9.0 ttl pk-yrs)    Types: Cigarettes    Start date: 06/21/2018    Quit date: 12/11/2022    Years since quitting: 0.5   Smokeless tobacco: Never  Vaping Use   Vaping status: Never Used  Substance and Sexual Activity   Alcohol use: No   Drug use: No   Sexual activity: Yes    Birth control/protection: Injection, Other-see comments    Comment: has had hysterectomy  Other Topics Concern   Not on file  Social History Narrative   Lives at home with husband and daughter   Social Determinants of Health   Financial Resource Strain: Not  on file  Food Insecurity: Not on file  Transportation Needs: Not on file  Physical Activity: Not on file  Stress: Not on file  Social Connections: Not on file  Intimate Partner Violence: Not on file     Family History  Problem Relation Age of Onset   Diabetes Father    Hypertension Father    Hyperlipidemia Father    Heart attack Father 26       "mild"   Melanoma Maternal Aunt        other aunts with BCC/SCC/Melanoma   Cervical cancer Maternal Aunt 77       daughter w/ cervical cancer as well   Lung cancer Maternal Aunt    Melanoma Maternal Uncle        other uncles with BCC/SCC/Melanoma   Breast cancer Paternal Aunt    Bladder Cancer Maternal Grandmother   Biological mother had Grave's disease.    Current Outpatient Medications:    acetaminophen (TYLENOL) 500 MG tablet, Take 500 mg by mouth every 6 (six) hours as needed., Disp: , Rfl:    albuterol (VENTOLIN HFA) 108 (90 Base) MCG/ACT inhaler, Inhale 2 puffs into the lungs every 6 (six) hours as needed for wheezing or shortness of breath. , Disp: , Rfl:    CALCIUM CARBONATE-VITAMIN D PO, Take 600-800 mg by mouth in the morning, at noon, and at bedtime., Disp: , Rfl:    chlorhexidine (PERIDEX) 0.12 % solution, Use as directed 15 mLs in the mouth or throat 2 (two) times daily., Disp: 120 mL, Rfl: 0   cyanocobalamin (,VITAMIN B-12,) 1000 MCG/ML injection, Inject 1,000 mcg into the skin every 30 (thirty) days., Disp: , Rfl:    cyclobenzaprine (FLEXERIL) 5 MG tablet, Take 5 mg by mouth 3 (three) times daily as needed for muscle spasms., Disp: , Rfl:    diphenoxylate-atropine (LOMOTIL) 2.5-0.025 MG tablet, Take 1 tablet by mouth 4 (four) times daily as needed for diarrhea or loose stools., Disp: 60 tablet, Rfl: 0   doxycycline (VIBRA-TABS) 100 MG tablet, Take 1 tablet (100 mg total) by mouth 2 (two) times daily., Disp: 10 tablet, Rfl: 0  escitalopram (LEXAPRO) 20 MG tablet, Take 20 mg by mouth daily., Disp: , Rfl:    esomeprazole  (NEXIUM) 40 MG capsule, Take 40 mg by mouth daily before breakfast. , Disp: , Rfl:    Eszopiclone 3 MG TABS, Take 3 mg by mouth at bedtime as needed (sleep). , Disp: , Rfl:    folic acid (FOLVITE) 1 MG tablet, Take 1 mg by mouth daily., Disp: , Rfl:    hydroxychloroquine (PLAQUENIL) 200 MG tablet, Take 200 mg by mouth daily., Disp: , Rfl:    hydrOXYzine (ATARAX) 10 MG tablet, Take 1 tablet (10 mg total) by mouth every 6 (six) hours as needed., Disp: 60 tablet, Rfl: 5   ibuprofen (ADVIL) 800 MG tablet, Take 800 mg by mouth every 8 (eight) hours as needed for moderate pain., Disp: , Rfl:    loperamide (IMODIUM) 2 MG capsule, Take 1 tablet (2 mg total) by mouth See admin instructions. Take 2 tablets with onset of diarrhea, then take 1 tablet every 2 hours until diarrhea stops. Maximum 8 tablets in 24, Disp: 90 capsule, Rfl: 0   loratadine (CLARITIN) 10 MG tablet, Take 10 mg by mouth daily. , Disp: , Rfl:    LORazepam (ATIVAN) 1 MG tablet, Take 1 mg by mouth 3 (three) times daily., Disp: , Rfl:    Magnesium 500 MG TABS, Take 500 mg by mouth 2 (two) times daily., Disp: , Rfl:    methocarbamol (ROBAXIN) 500 MG tablet, Take by mouth., Disp: , Rfl:    methotrexate (RHEUMATREX) 2.5 MG tablet, Take 25 mg by mouth every Sunday. 10 tablets once a week, Disp: , Rfl:    metoprolol tartrate (LOPRESSOR) 25 MG tablet, Take 0.5 tablets (12.5 mg total) by mouth as needed., Disp: 30 tablet, Rfl: 0   mupirocin ointment (BACTROBAN) 2 %, Place 1 application into the nose 2 (two) times daily. Use in each nostril twice daily for five (5) days., Disp: 22 g, Rfl: 5   nortriptyline (PAMELOR) 10 MG capsule, Take 30 mg by mouth at bedtime. , Disp: , Rfl:    oxyCODONE (OXY IR/ROXICODONE) 5 MG immediate release tablet, Take 1 tablet (5 mg total) by mouth every 6 (six) hours as needed for moderate pain or severe pain., Disp: 90 tablet, Rfl: 0   phentermine (ADIPEX-P) 37.5 MG tablet, Take 37.5 mg by mouth daily before breakfast. ,  Disp: , Rfl:    pregabalin (LYRICA) 150 MG capsule, Take 150 mg by mouth 2 (two) times daily., Disp: , Rfl:    promethazine (PHENERGAN) 25 MG tablet, Take 1 tablet (25 mg total) by mouth every 8 (eight) hours as needed for nausea or vomiting., Disp: 90 tablet, Rfl: 0   pyridOXINE (VITAMIN B-6) 100 MG tablet, Take 100 mg by mouth daily., Disp: , Rfl:    topiramate (TOPAMAX) 50 MG tablet, Take 50 mg by mouth 2 (two) times daily. , Disp: , Rfl:    vitamin C (ASCORBIC ACID) 500 MG tablet, Take 500 mg by mouth daily., Disp: , Rfl:  No current facility-administered medications for this visit.  Facility-Administered Medications Ordered in Other Visits:    heparin lock flush 100 unit/mL, 500 Units, Intravenous, Once, [name], MD   sodium chloride flush (NS) 0.9 % injection 10 mL, 10 mL, Intravenous, PRN, [name], MD, 10 mL at 11/19/18 1249   sodium chloride flush (NS) 0.9 % injection 10 mL, 10 mL, Intravenous, Once, [name], MD   Physical exam:  Vitals:   07/07/23 2130  BP: 98/68  Pulse: (!) 118  Resp: 18  Temp: (!) 97 F (36.1 C)  TempSrc: Tympanic  SpO2: 100%  Weight: 168 lb (76.2 kg)  ECOG 1 Physical Exam Constitutional:      Appearance: Normal appearance.  HENT:     Head: Normocephalic and atraumatic.  Eyes:     General: No scleral icterus. Neck:     Vascular: No JVD.  Cardiovascular:     Rate and Rhythm: Normal rate.  Pulmonary:     Effort: Pulmonary effort is normal. No respiratory distress.     Breath sounds: Normal breath sounds.  Abdominal:     General: There is no distension.     Palpations: Abdomen is soft.  Musculoskeletal:     Cervical back: Normal range of motion.     Comments: Limited right shoulder range of moton due to pain  Lymphadenopathy:     Cervical: No cervical adenopathy.  Skin:    General: Skin is warm.     Findings: No erythema or rash.  Neurological:     Mental Status: She is alert and oriented to person, place, and time. Mental status is at  baseline.     Cranial Nerves: No cranial nerve deficit.     Motor: No abnormal muscle tone.  Psychiatric:        Mood and Affect: Mood and affect normal.        Labs     Latest Ref Rng & Units 07/07/2023    8:21 AM  CMP  Glucose 70 - 99 mg/dL 034   BUN 6 - 20 mg/dL 14   Creatinine 7.42 - 1.00 mg/dL 5.95   Sodium 638 - 756 mmol/L 138   Potassium 3.5 - 5.1 mmol/L 3.1   Chloride 98 - 111 mmol/L 109   CO2 22 - 32 mmol/L 21   Calcium 8.9 - 10.3 mg/dL 9.2   Total Protein 6.5 - 8.1 g/dL 7.5   Total Bilirubin 0.3 - 1.2 mg/dL 0.4   Alkaline Phos 38 - 126 U/L 67   AST 15 - 41 U/L 26   ALT 0 - 44 U/L 18       Latest Ref Rng & Units 07/07/2023    8:21 AM  CBC  WBC 4.0 - 10.5 K/uL 15.2   Hemoglobin 12.0 - 15.0 g/dL 43.3   Hematocrit 29.5 - 46.0 % 31.5   Platelets 150 - 400 K/uL 295    RADIOGRAPHIC STUDIES: I have personally reviewed the radiological images as listed and agreed with the findings in the report. ECHOCARDIOGRAM LIMITED  Result Date: 05/30/2023    ECHOCARDIOGRAM LIMITED REPORT   Patient Name:   Theresa Chaney Date of Exam: 05/30/2023 Medical Rec #:  [id number]      Height:       67.0 in Accession #:    [id number]     Weight:       188.5 lb Date of Birth:  1981-05-14      BSA:          1.972 m Patient Age:    42 years       BP:           107/70 mmHg Patient Gender: F              HR:           104 bpm. Exam Location:  [place] Procedure: Limited Echo, 3D Echo, Limited Color Doppler and Strain Analysis Indications:    I42.80 Non-ischemic  cardiomyopathy  History:        Patient has prior history of Echocardiogram examinations, most                 recent 02/24/2023. Cardiomyopathy, COPD, Arrythmias:Tachycardia,                 Signs/Symptoms:Murmur and Shortness of Breath; Risk                 Factors:Former Smoker. Last chemo round was 05/26/23.  Sonographer:    Quentin Ore RDMS, RVT, RDCS Referring Phys: [id number] Raymon Mutton DUNN  Sonographer Comments: Global longitudinal strain was  attempted. IMPRESSIONS  1. Left ventricular ejection fraction, by estimation, is 50 to 55%. Left ventricular ejection fraction by PLAX is 53 %. The left ventricle has low normal function. The left ventricle has no regional wall motion abnormalities. Left ventricular diastolic parameters are indeterminate. The average left ventricular global longitudinal strain is -13.6 %.  2. Right ventricular systolic function is normal. The right ventricular size is normal. Tricuspid regurgitation signal is inadequate for assessing PA pressure.  3. The mitral valve is normal in structure. No evidence of mitral valve regurgitation.  4. The aortic valve is normal in structure. Aortic valve regurgitation is not visualized. No aortic stenosis is present.  5. The inferior vena cava is normal in size with greater than 50% respiratory variability, suggesting right atrial pressure of 3 mmHg. FINDINGS  Left Ventricle: Left ventricular ejection fraction, by estimation, is 50 to 55%. Left ventricular ejection fraction by PLAX is 53 %. The left ventricle has low normal function. The left ventricle has no regional wall motion abnormalities. Definity contrast agent was given IV to delineate the left ventricular endocardial borders. The average left ventricular global longitudinal strain is -13.6 %. The left ventricular internal cavity size was normal in size. There is no left ventricular hypertrophy.  Left ventricular diastolic parameters are indeterminate. Right Ventricle: The right ventricular size is normal. No increase in right ventricular wall thickness. Right ventricular systolic function is normal. Tricuspid regurgitation signal is inadequate for assessing PA pressure. Left Atrium: Left atrial size was normal in size. Right Atrium: Right atrial size was normal in size. Pericardium: There is no evidence of pericardial effusion. Mitral Valve: The mitral valve is normal in structure. Tricuspid Valve: The tricuspid valve is normal in  structure. Aortic Valve: The aortic valve is normal in structure. Aortic valve regurgitation is not visualized. No aortic stenosis is present. Pulmonic Valve: The pulmonic valve was normal in structure. Aorta: The aortic root is normal in size and structure. Venous: The inferior vena cava is normal in size with greater than 50% respiratory variability, suggesting right atrial pressure of 3 mmHg. IAS/Shunts: No atrial level shunt detected by color flow Doppler. LEFT VENTRICLE PLAX 2D LV EF:         Left                ventricular     2D                ejection        Longitudinal                fraction by     Strain                PLAX is 53      2D Strain GLS  -13.6 %                %.  Avg: LVIDd:         5.10 cm LVIDs:         3.70 cm LV PW:         0.80 cm LV IVS:        0.70 cm         3D Volume EF:                                3D EF:        47 %                                LV EDV:       136 ml LV Volumes (MOD)               LV ESV:       72 ml LV vol d, MOD    99.5 ml       LV SV:        64 ml A2C: LV vol d, MOD    99.7 ml A4C: LV vol s, MOD    48.8 ml A2C: LV vol s, MOD    55.4 ml A4C: LV SV MOD A2C:   50.7 ml LV SV MOD A4C:   99.7 ml LV SV MOD BP:    46.4 ml Julien Nordmann MD Electronically signed by Julien Nordmann MD Signature Date/Time: 05/30/2023/8:13:57 PM    Final

## 2023-07-07 NOTE — Assessment & Plan Note (Signed)
Antibody treatment plan as list above. 

## 2023-07-07 NOTE — Assessment & Plan Note (Addendum)
Recurrent breast cancer with isolated bone metastasis, stage IV #Initially stage IIIA right breast cancer, ER positive, HER-2 negative status post bilateral oophorectomy,Right mastectomy and right axillary lymph node dissection, status post implant, and implant removal.  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 ER positive, HER-2 positive  .- s/p Palliative radiation to T7  Labs reviewed and discussed Proceed with Kanjinti Perjecta.   Continue fulvestrant monthly.  

## 2023-07-07 NOTE — Assessment & Plan Note (Addendum)
status post radiation. Continue Zometa monthly, hold if Calcium is <8.6  - hold due to recent bone fracture Continue calcium supplementation. Continue oxycodone as needed.

## 2023-07-07 NOTE — Assessment & Plan Note (Signed)
Continue calcium supplementation 600mg  TID.

## 2023-07-07 NOTE — Assessment & Plan Note (Signed)
She does not tolerate oral potassium.  Potassium is stable. 

## 2023-07-10 ENCOUNTER — Other Ambulatory Visit: Payer: Self-pay | Admitting: Oncology

## 2023-07-10 ENCOUNTER — Ambulatory Visit: Payer: Medicare HMO

## 2023-07-10 ENCOUNTER — Encounter: Payer: Self-pay | Admitting: Oncology

## 2023-07-10 ENCOUNTER — Inpatient Hospital Stay: Payer: Medicare HMO

## 2023-07-10 NOTE — Telephone Encounter (Signed)
Pt on antibiotics for Folliculitis and perifolliculitis, she would like pt evaluated by [facility] to determine if additional abx is needed. Please advise on scheduling

## 2023-07-11 ENCOUNTER — Encounter: Payer: Self-pay | Admitting: *Deleted

## 2023-07-11 ENCOUNTER — Encounter: Payer: Self-pay | Admitting: Oncology

## 2023-07-11 DIAGNOSIS — Z170 Estrogen receptor positive status [ER+]: Secondary | ICD-10-CM

## 2023-07-11 DIAGNOSIS — C50919 Malignant neoplasm of unspecified site of unspecified female breast: Secondary | ICD-10-CM | POA: Insufficient documentation

## 2023-07-11 DIAGNOSIS — M84374A Stress fracture, right foot, initial encounter for fracture: Secondary | ICD-10-CM | POA: Insufficient documentation

## 2023-07-11 DIAGNOSIS — M899 Disorder of bone, unspecified: Secondary | ICD-10-CM

## 2023-07-12 ENCOUNTER — Encounter: Payer: Self-pay | Admitting: Oncology

## 2023-07-12 NOTE — Telephone Encounter (Signed)
Please schedule bone density (next avail)and inform pt of appt.   Last bone density was on 10/2021, but pt has a foot fracture and surgeon is requesting a bone density to be done.

## 2023-07-14 ENCOUNTER — Ambulatory Visit: Payer: Medicare HMO

## 2023-07-14 ENCOUNTER — Inpatient Hospital Stay: Payer: Medicare HMO

## 2023-07-14 ENCOUNTER — Inpatient Hospital Stay: Payer: Medicare HMO | Attending: Oncology

## 2023-07-14 DIAGNOSIS — Z79899 Other long term (current) drug therapy: Secondary | ICD-10-CM | POA: Diagnosis not present

## 2023-07-14 DIAGNOSIS — C50411 Malignant neoplasm of upper-outer quadrant of right female breast: Secondary | ICD-10-CM

## 2023-07-14 DIAGNOSIS — Z5111 Encounter for antineoplastic chemotherapy: Secondary | ICD-10-CM | POA: Insufficient documentation

## 2023-07-14 MED ORDER — FULVESTRANT 250 MG/5ML IM SOSY
500.0000 mg | PREFILLED_SYRINGE | INTRAMUSCULAR | Status: DC
  Administered 2023-07-14: 500 mg via INTRAMUSCULAR
  Filled 2023-07-14: qty 10

## 2023-07-19 ENCOUNTER — Other Ambulatory Visit: Payer: Self-pay

## 2023-07-20 ENCOUNTER — Encounter: Payer: Self-pay | Admitting: Oncology

## 2023-07-21 ENCOUNTER — Other Ambulatory Visit: Payer: Self-pay

## 2023-07-21 ENCOUNTER — Ambulatory Visit: Payer: Medicare HMO | Admitting: Physician Assistant

## 2023-07-21 ENCOUNTER — Ambulatory Visit: Payer: Medicare HMO

## 2023-07-24 ENCOUNTER — Telehealth: Payer: Self-pay | Admitting: *Deleted

## 2023-07-24 ENCOUNTER — Encounter: Payer: Self-pay | Admitting: Oncology

## 2023-07-24 ENCOUNTER — Ambulatory Visit: Payer: Medicare HMO | Attending: Physician Assistant

## 2023-07-24 DIAGNOSIS — Z01810 Encounter for preprocedural cardiovascular examination: Secondary | ICD-10-CM

## 2023-07-24 NOTE — Telephone Encounter (Signed)
   Pre-operative Risk Assessment    Patient Name: Theresa Chaney  DOB: 08/13/1981 MRN: [id number]      Request for Surgical Clearance    Procedure:   Right Fifth metatarsal nonunion  Date of Surgery:  Clearance 07/26/23                                 Surgeon:  Dr. Inocencio Homes Surgeon's Group or Practice Name:  [facility] Phone number:  [phone] Fax number:  [phone]   Type of Clearance Requested:   - Medical    Type of Anesthesia:  Not Indicated   Additional requests/questions:    Signed, Emmit Pomfret   07/24/2023, 12:26 PM

## 2023-07-24 NOTE — Telephone Encounter (Signed)
Left message for pt to call back today that we just received a clearance request for surgery in 2 days on 07/26/23. Pt is going to need a tele pre op appt. I will update the requesting as well the pt needs a tele appt.

## 2023-07-24 NOTE — Telephone Encounter (Signed)
Pt did call back per operator, though I was finishing with another pt. I called the pt back and she has been scheduled for tele pre op appt 3:20 today. Add on ok per Jari Favre, PAC due to procedure date.  Med rec and consent are done.

## 2023-07-24 NOTE — Telephone Encounter (Signed)
Pt did call back per operator, though I was finishing with another pt. I called the pt back and she has been scheduled for tele pre op appt 3:20 today. Add on ok per Jari Favre, PAC due to procedure date.  Med rec and consent are done.      Patient Consent for Virtual Visit        [name] has provided verbal consent on 07/24/2023 for a virtual visit (video or telephone).   CONSENT FOR VIRTUAL VISIT FOR:  Theresa Chaney  By participating in this virtual visit I agree to the following:  I hereby voluntarily request, consent and authorize [facility] and its employed or contracted physicians, physician assistants, nurse practitioners or other licensed health care professionals (the Practitioner), to provide me with telemedicine health care services (the "Services") as deemed necessary by the treating Practitioner. I acknowledge and consent to receive the Services by the Practitioner via telemedicine. I understand that the telemedicine visit will involve communicating with the Practitioner through live audiovisual communication technology and the disclosure of certain medical information by electronic transmission. I acknowledge that I have been given the opportunity to request an in-person assessment or other available alternative prior to the telemedicine visit and am voluntarily participating in the telemedicine visit.  I understand that I have the right to withhold or withdraw my consent to the use of telemedicine in the course of my care at any time, without affecting my right to future care or treatment, and that the Practitioner or I may terminate the telemedicine visit at any time. I understand that I have the right to inspect all information obtained and/or recorded in the course of the telemedicine visit and may receive copies of available information for a reasonable fee.  I understand that some of the potential risks of receiving the Services via telemedicine include:   Delay or interruption in medical evaluation due to technological equipment failure or disruption; Information transmitted may not be sufficient (e.g. poor resolution of images) to allow for appropriate medical decision making by the Practitioner; and/or  In rare instances, security protocols could fail, causing a breach of personal health information.  Furthermore, I acknowledge that it is my responsibility to provide information about my medical history, conditions and care that is complete and accurate to the best of my ability. I acknowledge that Practitioner's advice, recommendations, and/or decision may be based on factors not within their control, such as incomplete or inaccurate data provided by me or distortions of diagnostic images or specimens that may result from electronic transmissions. I understand that the practice of medicine is not an exact science and that Practitioner makes no warranties or guarantees regarding treatment outcomes. I acknowledge that a copy of this consent can be made available to me via my patient portal [facility] MyChart), or I can request a printed copy by calling the office of [facility].    I understand that my insurance will be billed for this visit.   I have read or had this consent read to me. I understand the contents of this consent, which adequately explains the benefits and risks of the Services being provided via telemedicine.  I have been provided ample opportunity to ask questions regarding this consent and the Services and have had my questions answered to my satisfaction. I give my informed consent for the services to be provided through the use of telemedicine in my medical care

## 2023-07-24 NOTE — Progress Notes (Signed)
Virtual Visit via Telephone Note   Because of Theresa Chaney's co-morbid illnesses, she is at least at moderate risk for complications without adequate follow up.  This format is felt to be most appropriate for this patient at this time.  The patient did not have access to video technology/had technical difficulties with video requiring transitioning to audio format only (telephone).  All issues noted in this document were discussed and addressed.  No physical exam could be performed with this format.  Please refer to the patient's chart for her consent to telehealth for [facility].  Evaluation Performed:  Preoperative cardiovascular risk assessment _____________   Date:  07/24/2023   Patient ID:  Theresa Chaney, DOB May 02, 1981, MRN [id number] Patient Location:  Home Provider location:   Office  Primary Care Provider:  Leanna Sato, MD Primary Cardiologist:  Yvonne Kendall, MD  Chief Complaint / Patient Profile   42 y.o. y/o female with a h/o recurrent breast cancer with isolated bone metastasis status post mastectomy and chemoradiation complicated by mildly reduced LVEF, he WCT, paroxysmal SVT, lymphedema, COPD, anemia, migraine disorder, and GERD who is pending right fifth metatarsal nonunion on 07/26/2023 and presents today for telephonic preoperative cardiovascular risk assessment.  History of Present Illness    Theresa Karie Schwalbe Chaney is a 42 y.o. female who presents via audio/video conferencing for a telehealth visit today.  Pt was last seen in cardiology clinic on 05/30/2023 by Eula Listen, PA-C.  At that time SHALIKA DERY was doing well from a cardiovascular standpoint without any symptoms of angina or cardiac decompensation.  The patient is now pending procedure as outlined above. Since her last visit, she tells me she has had no heart related symptoms.  No chest pain or shortness of breath.  She does have neuropathy in her hands and feet.  However, she is still able to do quite  a bit activity wise and meets minimal METS.  No medications need to be held from a cardiac standpoint.  Past Medical History    Past Medical History:  Diagnosis Date   Anemia    Arthritis    BRCA negative 11/26/2017   Breast cancer (HCC) 10/11/2017   Multifocal, ER positive, PR negative, HER-2 negative. ypT3 ypN2a 8.7 cm, 4/15 nodes   Cardiomyopathy (HCC)    a. 10/2017 Echo: EF 60-65%, no rwma, Gr1 DD, nl RV size/fxn; b. 04/2018 Echo: EF 55-60%, no rwma, Nl RV size/fxn; c. 08/2018 Echo: EF 45%, diff HK, ? HK of antsept wall. Gr1 DD. Mild MR. Mild LAE/RAE. Mod dil RV.    Chronic bronchitis (HCC) 11/2017   COPD (chronic obstructive pulmonary disease) (HCC)    MILD PER CXR   Depression    Family history of cancer    GERD (gastroesophageal reflux disease)    Headache    MIGRAINES   Heart murmur    ASYMPTOMATIC   Personal history of chemotherapy    current for right breast ca   Past Surgical History:  Procedure Laterality Date   AXILLARY LYMPH NODE DISSECTION Right 03/19/2018   Procedure: AXILLARY LYMPH NODE DISSECTION;  Surgeon: Earline Mayotte, MD;  Location: [facility] ORS;  Service: General;  Laterality: Right;   BIOPSY  06/23/2023   Procedure: BIOPSY;  Surgeon: Wyline Mood, MD;  Location: [facility] ENDOSCOPY;  Service: Gastroenterology;;   BREAST BIOPSY Right 10/11/2017   12:30 posterior coil clip invasive mammary carcinoma   BREAST BIOPSY Right 10/11/2017   11:30 middle depth ribbon clip DCIS  BREAST BIOPSY Right 10/11/2017   5:30 anterior depth x shape invasive ductal carcinoma   BREAST IMPLANT REMOVAL Right 06/03/2019   Procedure: REMOVAL OF RIGHT BREAST IMPLANTS;  Surgeon: Peggye Form, DO;  Location: [facility] ORS;  Service: Plastics;  Laterality: Right;   BREAST RECONSTRUCTION WITH PLACEMENT OF TISSUE EXPANDER AND FLEX HD (ACELLULAR HYDRATED DERMIS) Right 03/19/2018   Procedure: BREAST RECONSTRUCTION WITH PLACEMENT OF TISSUE EXPANDER AND FLEX HD (ACELLULAR HYDRATED DERMIS);   Surgeon: Peggye Form, DO;  Location: [facility] ORS;  Service: Plastics;  Laterality: Right;   CARPAL TUNNEL RELEASE Bilateral 2020   CHOLECYSTECTOMY N/A 04/27/2020   Procedure: LAPAROSCOPIC CHOLECYSTECTOMY WITH INTRAOPERATIVE CHOLANGIOGRAM;  Surgeon: Earline Mayotte, MD;  Location: [facility] ORS;  Service: General;  Laterality: N/A;   COLONOSCOPY WITH PROPOFOL N/A 06/23/2023   Procedure: COLONOSCOPY WITH PROPOFOL;  Surgeon: Wyline Mood, MD;  Location: [facility] ENDOSCOPY;  Service: Gastroenterology;  Laterality: N/A;   ESOPHAGOGASTRODUODENOSCOPY (EGD) WITH PROPOFOL N/A 04/17/2020   Procedure: ESOPHAGOGASTRODUODENOSCOPY (EGD) WITH PROPOFOL;  Surgeon: Earline Mayotte, MD;  Location: [facility] ENDOSCOPY;  Service: Endoscopy;  Laterality: N/A;  with biopsy   ESOPHAGOGASTRODUODENOSCOPY (EGD) WITH PROPOFOL N/A 06/23/2023   Procedure: ESOPHAGOGASTRODUODENOSCOPY (EGD) WITH PROPOFOL;  Surgeon: Wyline Mood, MD;  Location: [facility] ENDOSCOPY;  Service: Gastroenterology;  Laterality: N/A;   LAPAROSCOPIC BILATERAL SALPINGO OOPHERECTOMY Bilateral 03/19/2018   Procedure: LAPAROSCOPIC BILATERAL SALPINGO OOPHORECTOMY;  Surgeon: Christeen Douglas, MD;  Location: [facility] ORS;  Service: Gynecology;  Laterality: Bilateral;   MASTECTOMY Right 03/2018   MASTECTOMY W/ SENTINEL NODE BIOPSY Right 03/19/2018   Procedure: MASTECTOMY WITH SENTINEL LYMPH NODE BIOPSY;  Surgeon: Earline Mayotte, MD;  Location: [facility] ORS;  Service: General;  Laterality: Right;   PORT-A-CATH REMOVAL Left 06/03/2019   Procedure: REMOVAL PORT-A-CATH;  Surgeon: Earline Mayotte, MD;  Location: [facility] ORS;  Service: General;  Laterality: Left;   PORTACATH PLACEMENT Left 10/24/2017   Procedure: INSERTION PORT-A-CATH;  Surgeon: Earline Mayotte, MD;  Location: [facility] ORS;  Service: General;  Laterality: Left;   PORTACATH PLACEMENT Right 09/28/2020   Procedure: INSERTION PORT-A-CATH;  Surgeon: Earline Mayotte, MD;  Location: [facility] ORS;  Service: General;  Laterality:  Right;   REMOVAL OF TISSUE EXPANDER AND PLACEMENT OF IMPLANT Right 07/20/2018   Procedure: REMOVAL OF RIGHT BREAST TISSUE EXPANDER AND PLACEMENT OF IMPLANT;  Surgeon: Peggye Form, DO;  Location: [facility];  Service: Plastics;  Laterality: Right;   SIMPLE MASTECTOMY WITH AXILLARY SENTINEL NODE BIOPSY Left 06/03/2019   Procedure: SIMPLE MASTECTOMY LEFT;  Surgeon: Earline Mayotte, MD;  Location: [facility] ORS;  Service: General;  Laterality: Left;    Allergies  Not on File  Home Medications    Prior to Admission medications   Medication Sig Start Date End Date Taking? Authorizing Provider  acetaminophen (TYLENOL) 500 MG tablet Take 500 mg by mouth every 6 (six) hours as needed.    [provider]  albuterol (VENTOLIN HFA) 108 (90 Base) MCG/ACT inhaler Inhale 2 puffs into the lungs every 6 (six) hours as needed for wheezing or shortness of breath.     [provider]  B-D ULTRAFINE III SHORT PEN 31G X 8 MM MISC Inject 1 Syringe into the skin daily. 06/16/23   [provider]  CALCIUM CARBONATE-VITAMIN D PO Take 600-800 mg by mouth in the morning, at noon, and at bedtime.    [provider]  chlorhexidine (PERIDEX) 0.12 % solution Use as directed 15 mLs in the mouth or throat  2 (two) times daily. 05/27/22   [name], MD  cyanocobalamin (,VITAMIN B-12,) 1000 MCG/ML injection Inject 1,000 mcg into the skin every 30 (thirty) days. 09/02/19   [provider]  cyclobenzaprine (FLEXERIL) 5 MG tablet Take 5 mg by mouth 3 (three) times daily as needed for muscle spasms. 08/06/19   [provider]  diphenoxylate-atropine (LOMOTIL) 2.5-0.025 MG tablet Take 1 tablet by mouth 4 (four) times daily as needed for diarrhea or loose stools. 10/28/20   [name], MD  doxycycline (VIBRA-TABS) 100 MG tablet Take 1 tablet (100 mg total) by mouth 2 (two) times daily. 07/07/23   [name], MD  escitalopram (LEXAPRO) 20 MG tablet Take 20 mg by mouth daily.     [provider]  esomeprazole (NEXIUM) 40 MG capsule Take 40 mg by mouth daily before breakfast.     [provider]  Eszopiclone 3 MG TABS Take 3 mg by mouth at bedtime as needed (sleep).  08/21/20   [provider]  folic acid (FOLVITE) 1 MG tablet Take 1 mg by mouth daily. 09/02/19   [provider]  hydrocortisone 2.5 % cream     [provider]  hydroxychloroquine (PLAQUENIL) 200 MG tablet Take 200 mg by mouth daily. 06/08/21   [provider]  hydrOXYzine (ATARAX) 10 MG tablet Take 1 tablet (10 mg total) by mouth every 6 (six) hours as needed. 05/26/23   [name], MD  ibuprofen (ADVIL) 800 MG tablet Take 800 mg by mouth every 8 (eight) hours as needed for moderate pain.    [provider]  loperamide (IMODIUM) 2 MG capsule Take 1 tablet (2 mg total) by mouth See admin instructions. Take 2 tablets with onset of diarrhea, then take 1 tablet every 2 hours until diarrhea stops. Maximum 8 tablets in 24 08/19/22   [name], MD  loratadine (CLARITIN) 10 MG tablet Take 10 mg by mouth daily.     [provider]  LORazepam (ATIVAN) 1 MG tablet Take 1 mg by mouth 3 (three) times daily.    [provider]  Magnesium 500 MG TABS Take 500 mg by mouth 2 (two) times daily.    [provider]  methocarbamol (ROBAXIN) 500 MG tablet Take by mouth. 01/10/23   [provider]  methotrexate (RHEUMATREX) 2.5 MG tablet Take 25 mg by mouth every Sunday. 10 tablets once a week 09/02/19   [provider]  metoprolol tartrate (LOPRESSOR) 25 MG tablet Take 0.5 tablets (12.5 mg total) by mouth as needed. 02/24/23   Dunn, Raymon Mutton, PA-C  mupirocin ointment (BACTROBAN) 2 % Place 1 application into the nose 2 (two) times daily. Use in each nostril twice daily for five (5) days. 05/27/21   [name], MD  nortriptyline (PAMELOR) 10 MG capsule Take 30 mg by mouth at bedtime.     [provider]  oxyCODONE (OXY  IR/ROXICODONE) 5 MG immediate release tablet Take 1 tablet (5 mg total) by mouth every 6 (six) hours as needed for moderate pain or severe pain. 05/11/23   [name], MD  phentermine (ADIPEX-P) 37.5 MG tablet Take 37.5 mg by mouth daily before breakfast.  09/17/19   [provider]  polyethylene glycol (GOLYTELY) 236 g solution     [provider]  pregabalin (LYRICA) 150 MG capsule Take 150 mg by mouth 2 (two) times daily. 07/27/20   [provider]  promethazine (PHENERGAN) 25 MG tablet Take 1 tablet (25 mg total) by mouth  every 8 (eight) hours as needed for nausea or vomiting. 02/15/22   [name], MD  pyridOXINE (VITAMIN B-6) 100 MG tablet Take 100 mg by mouth daily.    [provider]  topiramate (TOPAMAX) 50 MG tablet Take 50 mg by mouth 2 (two) times daily.  11/06/19   [provider]  VICTOZA 18 MG/3ML SOPN Inject into the skin. 06/16/23   [provider]  vitamin C (ASCORBIC ACID) 500 MG tablet Take 500 mg by mouth daily.    [provider]    Physical Exam    Vital Signs:  Teasha T Gotte does not have vital signs available for review today.  Given telephonic nature of communication, physical exam is limited. AAOx3. NAD. Normal affect.  Speech and respirations are unlabored.  Accessory Clinical Findings    None  Assessment & Plan    1.  Preoperative Cardiovascular Risk Assessment:  Ms. Grennell perioperative risk of a major cardiac event is 0.9% according to the Revised Cardiac Risk Index (RCRI).  Therefore, she is at low risk for perioperative complications.   Her functional capacity is good at 5.62 METs according to the Duke Activity Status Index (DASI). Recommendations: According to ACC/AHA guidelines, no further cardiovascular testing needed.  The patient may proceed to surgery at acceptable risk.     The patient was advised that if she develops new symptoms prior to surgery to contact our office to arrange for a  follow-up visit, and she verbalized understanding.   A copy of this note will be routed to requesting surgeon.  Time:   Today, I have spent 10 minutes with the patient with telehealth technology discussing medical history, symptoms, and management plan.     Sharlene Dory, PA-C  07/24/2023, 3:24 PM

## 2023-07-24 NOTE — Telephone Encounter (Signed)
   Name: Theresa Chaney  DOB: 1981/03/22  MRN: [id number]  Primary Cardiologist: Yvonne Kendall, MD  Chart reviewed as part of pre-operative protocol coverage. Because of [name]'s past medical history and time since last visit, she will require a follow-up telephone visit in order to better assess preoperative cardiovascular risk.  Pre-op covering staff: - Please schedule appointment and call patient to inform them. If patient already had an upcoming appointment within acceptable timeframe, please add "pre-op clearance" to the appointment notes so provider is aware. - Please contact requesting surgeon's office via preferred method (i.e, phone, fax) to inform them of need for appointment prior to surgery.  No medications indicated as needing held.  Sharlene Dory, PA-C  07/24/2023, 12:47 PM

## 2023-07-25 ENCOUNTER — Other Ambulatory Visit: Payer: Self-pay | Admitting: Oncology

## 2023-07-25 ENCOUNTER — Telehealth: Payer: Self-pay | Admitting: Internal Medicine

## 2023-07-25 DIAGNOSIS — Z170 Estrogen receptor positive status [ER+]: Secondary | ICD-10-CM

## 2023-07-25 NOTE — Telephone Encounter (Signed)
Caller wants call back on status of patient's clearance for surgery scheduled tomorrow.

## 2023-07-25 NOTE — Telephone Encounter (Signed)
I will forward to pre op APP to review if pt has been cleared.

## 2023-07-25 NOTE — Telephone Encounter (Signed)
Requesting a copy of her a ECHO results. Fax [phone]. Please advise

## 2023-07-26 ENCOUNTER — Other Ambulatory Visit: Payer: Medicare HMO

## 2023-07-26 NOTE — Telephone Encounter (Signed)
I re-faxed my note from Monday to the fax number on the request. Please let me know if anything else is needed.   Sharlene Dory, PA-C

## 2023-07-26 NOTE — Telephone Encounter (Signed)
Left VM for [facility] to call back.

## 2023-07-27 ENCOUNTER — Encounter: Payer: Self-pay | Admitting: Oncology

## 2023-07-27 ENCOUNTER — Other Ambulatory Visit: Payer: Self-pay

## 2023-07-28 ENCOUNTER — Ambulatory Visit: Payer: Medicare HMO

## 2023-07-28 ENCOUNTER — Encounter: Payer: Self-pay | Admitting: Oncology

## 2023-07-28 ENCOUNTER — Other Ambulatory Visit: Payer: Medicare HMO

## 2023-07-28 MED ORDER — OXYCODONE HCL 5 MG PO TABS: 5.0000 mg | ORAL_TABLET | Freq: Four times a day (QID) | ORAL | 0 refills | Status: DC | PRN

## 2023-08-04 ENCOUNTER — Inpatient Hospital Stay: Payer: Medicare HMO

## 2023-08-04 ENCOUNTER — Encounter: Payer: Self-pay | Admitting: Oncology

## 2023-08-04 ENCOUNTER — Other Ambulatory Visit: Payer: Self-pay

## 2023-08-04 ENCOUNTER — Other Ambulatory Visit: Payer: Medicare HMO

## 2023-08-04 ENCOUNTER — Inpatient Hospital Stay (HOSPITAL_BASED_OUTPATIENT_CLINIC_OR_DEPARTMENT_OTHER): Payer: Medicare HMO | Admitting: Oncology

## 2023-08-04 VITALS — BP 114/76 | HR 106 | Temp 97.2°F

## 2023-08-04 DIAGNOSIS — C50811 Malignant neoplasm of overlapping sites of right female breast: Secondary | ICD-10-CM

## 2023-08-04 DIAGNOSIS — Z170 Estrogen receptor positive status [ER+]: Secondary | ICD-10-CM

## 2023-08-04 DIAGNOSIS — C50411 Malignant neoplasm of upper-outer quadrant of right female breast: Secondary | ICD-10-CM

## 2023-08-04 DIAGNOSIS — Z5111 Encounter for antineoplastic chemotherapy: Secondary | ICD-10-CM | POA: Diagnosis not present

## 2023-08-04 DIAGNOSIS — M069 Rheumatoid arthritis, unspecified: Secondary | ICD-10-CM

## 2023-08-04 DIAGNOSIS — C7951 Secondary malignant neoplasm of bone: Secondary | ICD-10-CM

## 2023-08-04 DIAGNOSIS — G620 Drug-induced polyneuropathy: Secondary | ICD-10-CM

## 2023-08-04 DIAGNOSIS — I428 Other cardiomyopathies: Secondary | ICD-10-CM

## 2023-08-04 DIAGNOSIS — T451X5A Adverse effect of antineoplastic and immunosuppressive drugs, initial encounter: Secondary | ICD-10-CM

## 2023-08-04 DIAGNOSIS — Z5112 Encounter for antineoplastic immunotherapy: Secondary | ICD-10-CM

## 2023-08-04 LAB — CBC WITH DIFFERENTIAL/PLATELET
Abs Immature Granulocytes: 0.02 10*3/uL (ref 0.00–0.07)
Basophils Absolute: 0.1 10*3/uL (ref 0.0–0.1)
Basophils Relative: 1 %
Eosinophils Absolute: 0.1 10*3/uL (ref 0.0–0.5)
Eosinophils Relative: 2 %
HCT: 34.3 % — ABNORMAL LOW (ref 36.0–46.0)
Hemoglobin: 10.9 g/dL — ABNORMAL LOW (ref 12.0–15.0)
Immature Granulocytes: 0 %
Lymphocytes Relative: 35 %
Lymphs Abs: 2.1 10*3/uL (ref 0.7–4.0)
MCH: 28.2 pg (ref 26.0–34.0)
MCHC: 31.8 g/dL (ref 30.0–36.0)
MCV: 88.6 fL (ref 80.0–100.0)
Monocytes Absolute: 0.7 10*3/uL (ref 0.1–1.0)
Monocytes Relative: 12 %
Neutro Abs: 3.1 10*3/uL (ref 1.7–7.7)
Neutrophils Relative %: 50 %
Platelets: 295 10*3/uL (ref 150–400)
RBC: 3.87 MIL/uL (ref 3.87–5.11)
RDW: 13.4 % (ref 11.5–15.5)
WBC: 6.2 10*3/uL (ref 4.0–10.5)
nRBC: 0 % (ref 0.0–0.2)

## 2023-08-04 LAB — COMPREHENSIVE METABOLIC PANEL
ALT: 20 U/L (ref 0–44)
AST: 28 U/L (ref 15–41)
Albumin: 4.4 g/dL (ref 3.5–5.0)
Alkaline Phosphatase: 59 U/L (ref 38–126)
Anion gap: 7 (ref 5–15)
BUN: 15 mg/dL (ref 6–20)
CO2: 23 mmol/L (ref 22–32)
Calcium: 9 mg/dL (ref 8.9–10.3)
Chloride: 107 mmol/L (ref 98–111)
Creatinine, Ser: 0.9 mg/dL (ref 0.44–1.00)
GFR, Estimated: >60 mL/min (ref >60)
Glucose, Bld: 121 mg/dL — ABNORMAL HIGH (ref 70–99)
Potassium: 3.5 mmol/L (ref 3.5–5.1)
Sodium: 137 mmol/L (ref 135–145)
Total Bilirubin: 0.3 mg/dL (ref 0.3–1.2)
Total Protein: 7.7 g/dL (ref 6.5–8.1)

## 2023-08-04 LAB — SEDIMENTATION RATE: Sed Rate: 34 mm/hr — ABNORMAL HIGH (ref 0–20)

## 2023-08-04 MED ORDER — HEPARIN SOD (PORK) LOCK FLUSH 100 UNIT/ML IV SOLN
500.0000 [IU] | Freq: Once | INTRAVENOUS | Status: AC | PRN
  Administered 2023-08-04: 500 [IU]
  Filled 2023-08-04: qty 5

## 2023-08-04 MED ORDER — SODIUM CHLORIDE 0.9 % IV SOLN
Freq: Once | INTRAVENOUS | Status: AC
  Filled 2023-08-04: qty 250

## 2023-08-04 MED ORDER — TRASTUZUMAB-ANNS CHEMO 150 MG IV SOLR
6.0000 mg/kg | Freq: Once | INTRAVENOUS | Status: AC
  Administered 2023-08-04: 462 mg via INTRAVENOUS
  Filled 2023-08-04: qty 22

## 2023-08-04 MED ORDER — SODIUM CHLORIDE 0.9 % IV SOLN
420.0000 mg | Freq: Once | INTRAVENOUS | Status: AC
  Administered 2023-08-04: 420 mg via INTRAVENOUS
  Filled 2023-08-04: qty 14

## 2023-08-04 NOTE — Progress Notes (Signed)
Hematology/Oncology Progress note [phone] [phone]    REASON FOR VISIT Follow up for  treatment of breast cancer   ASSESSMENT & PLAN:   Cancer Staging  Breast cancer of upper-outer quadrant of right female breast (HCC) Staging form: Breast, AJCC 8th Edition - Clinical: G2, ER+, PR+, HER2- - Signed by [name], MD on 04/05/2018 - Pathologic stage from 04/04/2018: No Stage Recommended (ypT3, pN2, cM0, G3, ER+, PR-, HER2-) - Signed by [name], MD on 04/04/2018 - Pathologic: Stage IV (rpTX, pNX, cM1, ER+, PR-, HER2+) - Signed by [name], MD on 12/10/2020   Breast cancer of upper-outer quadrant of right female breast (HCC) Recurrent breast cancer with isolated bone metastasis, stage IV #Initially stage IIIA right breast cancer, ER positive, HER-2 negative status post bilateral oophorectomy,Right mastectomy and right axillary lymph node dissection, status post implant, and implant removal.  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 ER positive, HER-2 positive  .- s/p Palliative radiation to T7  Labs reviewed and discussed Proceed with Kanjinti Perjecta.   Continue fulvestrant monthly.  Nonischemic cardiomyopathy (HCC) Chronic tachycardia Stable low-normal LVEF 50-55%. Follow up with cardiology. She gets ECHO Q3 months.  Neuropathy due to chemotherapeutic drug (HCC) continue Lyrica and nortriptyline symptoms stable  Rheumatoid arthritis (HCC) Follow up with  rheumatology.  Patient is on methotrexate,  plaquenil.  Add ESR per patient's request.   Hypocalcemia Continue calcium supplementation 600mg  TID.    Encounter for monoclonal antibody treatment for malignancy Antibody treatment plan as list above.  Malignant neoplasm metastatic to bone [facility]) status post radiation. Continue Zometa monthly, hold if Calcium is <8.6  - hold due to recent bone fracture/surgery - until  10/06/23 Continue calcium supplementation. Continue oxycodone as needed.     Orders Placed This Encounter  Procedures   Sedimentation rate    Standing Status:   Future    Number of Occurrences:   1    Standing Expiration Date:   08/03/2024   Comprehensive metabolic panel    Standing Status:   Future    Standing Expiration Date:   09/14/2024   CBC with Differential    Standing Status:   Future    Standing Expiration Date:   09/14/2024   Comprehensive metabolic panel    Standing Status:   Future    Standing Expiration Date:   10/05/2024   CBC with Differential    Standing Status:   Future    Standing Expiration Date:   10/05/2024   Follow up  lab Kanjinti Perjecta 3 weeks.  lab MD Kanjinti Perjecta 6 weeks.  fulvestrant Q 4 weeks   All questions were answered. The patient knows to call the clinic with any problems, questions or concerns.  [name], MD, PhD [facility] Hematology Oncology 08/04/2023       Oncology History  Oncology History Overview Note        Malignant neoplasm of overlapping sites of right breast in female, estrogen receptor positive (HCC)  Breast cancer of upper-outer quadrant of right female breast (HCC)  10/19/2017 Initial Diagnosis   Malignant neoplasm of overlapping sites of right breast in female, estrogen receptor positive (HCC)   11/08/2017 - 01/03/2018 Chemotherapy   Neoadjuvant ddAC x 4 + 1 cycle of Taxol  due to lack of response, surgery was offered.   03/19/2018 Surgery   S/p right mastectomy and right axillary dissection, immediate breast reconstruction with placement of expanders.  ypT3  ypN2, + lymphovascular invasion,  Grade 3, margin is negative, close. ER 90%, PR 0%, HER2 IHC negative.   # elective bilateral salpingo-oophorectomy..    06/11/2018 Imaging   s/p 11 cycles Taxol adjuvantly   10/10/2018 -  Radiation Therapy   adjuvant right chest wall radiation   06/03/2019 Surgery   underwent elective left prophylactic mastectomy  and sentinel lymph node biopsy of left axilla Pathology negative for malignancy  # Mediport removal  # right implant removal on    07/07/2020 Imaging   MRI thoracic spine without contrast showed lesions involving the T7 posterior elements most concerning for metastatic lesion.  No evidence of epidural tumor.  Minimal thoracic spondylosis without stenosis. MRI was reviewed by me and a PET scan was obtained for further evaluation   07/20/2020 Imaging   PET scan showed hypermetabolic metastasis involving the posterior element of T7, no additional evidence of metastasis in the neck, chest, abdomen or pelvis.   07/29/2020 Procedure   T7 lesion biopsy showed metastatic carcinoma, compatible with breast origin.  Receptor status staining showed ER 71-80% positive, PR negative, HER-2 positive IHC 3+   08/31/2020 -  Radiation Therapy    finished spine radiation    09/17/2020 -  Chemotherapy   Patient is on Treatment Plan :  BREAST Trastuzumab + Pertuzumab q21d     12/10/2020 Cancer Staging   Staging form: Breast, AJCC 8th Edition - Pathologic: Stage IV (rpTX, pNX, cM1, ER+, PR-, HER2+) - Signed by [name], MD on 12/10/2020   03/15/2021 Imaging    PET scan showed no focal hypermetabolic activity to suggest skeletal metastasis.  Mild hypermetabolic activity along the right T7-8 paraspinal musculature.  Max SUV 3.3.  Likely postprocedural-   09/28/2021 Echocardiogram   further decrease of LVEF to 45%   10/14/2021 Imaging   PET  Post bilateral mastectomy and RIGHT axillary dissection without signs of recurrent or metastatic disease.   11/12/2021 Echocardiogram   LVEF of 45-50%.   02/18/2022 Imaging   MRI thoracic and lumbar spine w wo contrast  1. Negative MRIs of the thoracic and lumbar spine. No evidence for locally recurrent metastasis at the level of T7. No other new metastatic disease elsewhere with thoracolumbar spine. 2. No significant disc pathology, stenosis, or evidence for neural  impingement.    02/25/2022 Echocardiogram   LVEF 55-60%.   03/16/2022 Imaging   PET showed Stable PET-CT. No findings for local recurrent breast cancer,locoregional adenopathy or distant metastatic disease.    07/20/2022 Imaging   MRI Thoracic Spine w wo contrast 1. Interval resolution of the increased T2 signal contrast enhancement in the posterior elements of T7. No new lesion identified. 2. No spinal canal or neural foraminal stenosis.   08/23/2022 Echocardiogram   Stable low-normal LVEF at 50-55%.   10/05/2022 Imaging   PET restaging Stable examination without evidence of hypermetabolic recurrence or metastatic disease.   Persistent minimally metabolic stranding in the posterior bilateral gluteal soft tissues may reflect sequela of subcutaneous injections   02/24/2023 Echocardiogram   Echocardiogram showed stable low normal LVEF 50-55%.   04/07/2023 Imaging   PET scan  1. Status post bilateral mastectomies without evidence of hypermetabolic local recurrence. 2. No evidence of hypermetabolic metastatic disease. 3. Asymmetric right-sided tonsillar FDG avidity is nonspecific consider further evaluation with direct visualization. 4. Gas fluid levels in the proximal colon may reflect a diarrheal illness or laxative use.      INTERVAL HISTORY 42 yo female with above oncology history reviewed by  me presents for follow-up of management of metastatic Triple positive breast cancer.  Chronic back pain, unchanged.  Neck/upper back pain for 1 week Intermittent diarrhea, manageable.  No fever or chills. + arthralgia + right ankle fracture, s/p surgery    Review of Systems  Constitutional:  Negative for chills, fever, malaise/fatigue and weight loss.  HENT:  Negative for sore throat.        Hair loss  Eyes:  Negative for redness.  Respiratory:  Negative for cough, shortness of breath and wheezing.   Cardiovascular:  Negative for chest pain, palpitations and leg swelling.   Gastrointestinal:  Negative for abdominal pain, blood in stool, heartburn, nausea and vomiting.  Genitourinary:  Negative for dysuria.  Musculoskeletal:  Positive for back pain and joint pain.  Skin:  Positive for rash.  Neurological:  Positive for tingling. Negative for dizziness and tremors.  Endo/Heme/Allergies:  Does not bruise/bleed easily.  Psychiatric/Behavioral:  Negative for hallucinations. The patient does not have insomnia.     Not on File  Patient Active Problem List   Diagnosis Date Noted   Breast cancer of upper-outer quadrant of right female breast (HCC) 03/19/2018    Priority: High   Hypokalemia 06/03/2022    Priority: Medium    Malignant neoplasm metastatic to bone (HCC) 03/25/2021    Priority: Medium    Encounter for monoclonal antibody treatment for malignancy 12/10/2020    Priority: Medium    Hypocalcemia 11/19/2020    Priority: Medium    Neuropathy due to chemotherapeutic drug (HCC) 08/11/2020    Priority: Medium    Rheumatoid arthritis (HCC) 10/13/2019    Priority: Medium    Nonischemic cardiomyopathy (HCC) 08/23/2018    Priority: Medium    Folliculitis and perifolliculitis 04/14/2023    Priority: Low   Abnormal bowel movement 04/14/2023    Priority: Low   Right shoulder pain 01/20/2023    Priority: Low   Thyroid nodule 06/04/2022    Priority: Low   Encounter for antineoplastic chemotherapy 10/29/2020    Priority: Low   Goals of care, counseling/discussion 08/11/2020    Priority: Low   Gastroesophageal reflux disease without esophagitis 02/24/2017    Priority: Low   Malignant tumor of breast (HCC) 07/11/2023   Stress fracture of metatarsal bone of right foot 07/11/2023   Anemia 06/23/2023   Change in bowel habits 06/23/2023   Closed fracture of fifth metatarsal bone 05/09/2023   Tachycardia, unspecified 04/15/2021   Bone lesion 11/19/2020   Inflammatory arthritis 11/19/2020   HER2-positive carcinoma of breast (HCC) 08/11/2020   Bilateral  hand swelling 10/03/2019   Fracture of neck of metacarpal bone 05/14/2019   Chronic fatigue 04/09/2019   Polyarthralgia 04/09/2019   Status post right breast reconstruction 02/26/2019   Status post right mastectomy 02/26/2019   Mastalgia 02/15/2019   Shortness of breath 08/23/2018   Preprocedural cardiovascular examination 08/23/2018   Tachycardia 08/23/2018   Palpitations 08/23/2018   Estrogen receptor positive status (ER+) 04/04/2018   Acquired absence of right breast and nipple 04/03/2018   Family history of cancer    Malignant neoplasm of overlapping sites of right breast in female, estrogen receptor positive (HCC) 10/19/2017   Generalized anxiety disorder 10/03/2014   Headache 10/03/2014     Past Medical History:  Diagnosis Date   Anemia    Arthritis    BRCA negative 11/26/2017   Breast cancer (HCC) 10/11/2017   Multifocal, ER positive, PR negative, HER-2 negative. ypT3 ypN2a 8.7 cm, 4/15 nodes   Cardiomyopathy (HCC)  a. 10/2017 Echo: EF 60-65%, no rwma, Gr1 DD, nl RV size/fxn; b. 04/2018 Echo: EF 55-60%, no rwma, Nl RV size/fxn; c. 08/2018 Echo: EF 45%, diff HK, ? HK of antsept wall. Gr1 DD. Mild MR. Mild LAE/RAE. Mod dil RV.    Chronic bronchitis (HCC) 11/2017   COPD (chronic obstructive pulmonary disease) (HCC)    MILD PER CXR   Depression    Family history of cancer    GERD (gastroesophageal reflux disease)    Headache    MIGRAINES   Heart murmur    ASYMPTOMATIC   Personal history of chemotherapy    current for right breast ca     Past Surgical History:  Procedure Laterality Date   AXILLARY LYMPH NODE DISSECTION Right 03/19/2018   Procedure: AXILLARY LYMPH NODE DISSECTION;  Surgeon: Earline Mayotte, MD;  Location: [facility];  Service: General;  Laterality: Right;   BIOPSY  06/23/2023   Procedure: BIOPSY;  Surgeon: Wyline Mood, MD;  Location: [facility];  Service: Gastroenterology;;   BREAST BIOPSY Right 10/11/2017   12:30 posterior coil clip invasive  mammary carcinoma   BREAST BIOPSY Right 10/11/2017   11:30 middle depth ribbon clip DCIS   BREAST BIOPSY Right 10/11/2017   5:30 anterior depth x shape invasive ductal carcinoma   BREAST IMPLANT REMOVAL Right 06/03/2019   Procedure: REMOVAL OF RIGHT BREAST IMPLANTS;  Surgeon: Peggye Form, DO;  Location: [facility];  Service: Plastics;  Laterality: Right;   BREAST RECONSTRUCTION WITH PLACEMENT OF TISSUE EXPANDER AND FLEX HD (ACELLULAR HYDRATED DERMIS) Right 03/19/2018   Procedure: BREAST RECONSTRUCTION WITH PLACEMENT OF TISSUE EXPANDER AND FLEX HD (ACELLULAR HYDRATED DERMIS);  Surgeon: Peggye Form, DO;  Location: [facility];  Service: Plastics;  Laterality: Right;   CARPAL TUNNEL RELEASE Bilateral 2020   CHOLECYSTECTOMY N/A 04/27/2020   Procedure: LAPAROSCOPIC CHOLECYSTECTOMY WITH INTRAOPERATIVE CHOLANGIOGRAM;  Surgeon: Earline Mayotte, MD;  Location: [facility];  Service: General;  Laterality: N/A;   COLONOSCOPY WITH PROPOFOL N/A 06/23/2023   Procedure: COLONOSCOPY WITH PROPOFOL;  Surgeon: Wyline Mood, MD;  Location[facility] ENDOSCOPY;  Service: Gastroenterology;  Laterality: N/A;   ESOPHAGOGASTRODUODENOSCOPY (EGD) WITH PROPOFOL N/A 04/17/2020   Procedure: ESOPHAGOGASTRODUODENOSCOPY (EGD) WITH PROPOFOL;  Surgeon: Earline Mayotte, MD;  Location: [facility] ENDOSCOPY;  Service: Endoscopy;  Laterality: N/A;  with biopsy   ESOPHAGOGASTRODUODENOSCOPY (EGD) WITH PROPOFOL N/A 06/23/2023   Procedure: ESOPHAGOGASTRODUODENOSCOPY (EGD) WITH PROPOFOL;  Surgeon: Wyline Mood, MD;  Location: [facility] ENDOSCOPY;  Service: Gastroenterology;  Laterality: N/A;   LAPAROSCOPIC BILATERAL SALPINGO OOPHERECTOMY Bilateral 03/19/2018   Procedure: LAPAROSCOPIC BILATERAL SALPINGO OOPHORECTOMY;  Surgeon: Christeen Douglas, MD;  Location: [facility];  Service: Gynecology;  Laterality: Bilateral;   MASTECTOMY Right 03/2018   MASTECTOMY W/ SENTINEL NODE BIOPSY Right 03/19/2018   Procedure: MASTECTOMY WITH SENTINEL LYMPH NODE BIOPSY;   Surgeon: Earline Mayotte, MD;  Location: [facility];  Service: General;  Laterality: Right;   PORT-A-CATH REMOVAL Left 06/03/2019   Procedure: REMOVAL PORT-A-CATH;  Surgeon: Earline Mayotte, MD;  Location: [facility];  Service: General;  Laterality: Left;   PORTACATH PLACEMENT Left 10/24/2017   Procedure: INSERTION PORT-A-CATH;  Surgeon: Earline Mayotte, MD;  Location: [facility];  Service: General;  Laterality: Left;   PORTACATH PLACEMENT Right 09/28/2020   Procedure: INSERTION PORT-A-CATH;  Surgeon: Earline Mayotte, MD;  Location: [facility];  Service: General;  Laterality: Right;   REMOVAL OF TISSUE EXPANDER AND PLACEMENT OF IMPLANT Right 07/20/2018   Procedure: REMOVAL OF RIGHT BREAST TISSUE EXPANDER  AND PLACEMENT OF IMPLANT;  Surgeon: Peggye Form, DO;  Location: [facility];  Service: Plastics;  Laterality: Right;   SIMPLE MASTECTOMY WITH AXILLARY SENTINEL NODE BIOPSY Left 06/03/2019   Procedure: SIMPLE MASTECTOMY LEFT;  Surgeon: Earline Mayotte, MD;  Location: [facility];  Service: General;  Laterality: Left;    Social History   Socioeconomic History   Marital status: Married    Spouse name: Not on file   Number of children: Not on file   Years of education: Not on file   Highest education level: Not on file  Occupational History   Occupation: pharmacy tech    Comment: [employer]up[employer]ist [employer]   Tobacco Use   Smoking status: Former    Current packs/day: 0.00    Average packs/day: 0.5 packs/day for 18.0 years (9.0 ttl pk-yrs)    Types: Cigarettes    Start date: 06/21/2018    Quit date: 12/11/2022    Years since quitting: 0.6   Smokeless tobacco: Never  Vaping Use   Vaping status: Never Used  Substance and Sexual Activity   Alcohol use: No   Drug use: No   Sexual activity: Yes    Birth control/protection: Injection, Other-see comments    Comment: has had hysterectomy  Other Topics Concern   Not on file  Social History Narrative    Lives at home with husband and daughter   Social Determinants of Health   Financial Resource Strain: Not on file  Food Insecurity: Not on file  Transportation Needs: Not on file  Physical Activity: Not on file  Stress: Not on file  Social Connections: Not on file  Intimate Partner Violence: Not on file     Family History  Problem Relation Age of Onset   Diabetes Father    Hypertension Father    Hyperlipidemia Father    Heart attack Father 67       "mild"   Melanoma Maternal Aunt        other aunts with BCC/SCC/Melanoma   Cervical cancer Maternal Aunt 40       daughter w/ cervical cancer as well   Lung cancer Maternal Aunt    Melanoma Maternal Uncle        other uncles with BCC/SCC/Melanoma   Breast cancer Paternal Aunt    Bladder Cancer Maternal Grandmother   Biological mother had Grave's disease.    Current Outpatient Medications:    acetaminophen (TYLENOL) 500 MG tablet, Take 500 mg by mouth every 6 (six) hours as needed., Disp: , Rfl:    albuterol (VENTOLIN HFA) 108 (90 Base) MCG/ACT inhaler, Inhale 2 puffs into the lungs every 6 (six) hours as needed for wheezing or shortness of breath. , Disp: , Rfl:    B-D ULTRAFINE III SHORT PEN 31G X 8 MM MISC, Inject 1 Syringe into the skin daily., Disp: , Rfl:    CALCIUM CARBONATE-VITAMIN D PO, Take 600-800 mg by mouth in the morning, at noon, and at bedtime., Disp: , Rfl:    chlorhexidine (PERIDEX) 0.12 % solution, Use as directed 15 mLs in the mouth or throat 2 (two) times daily., Disp: 120 mL, Rfl: 0   cyanocobalamin (,VITAMIN B-12,) 1000 MCG/ML injection, Inject 1,000 mcg into the skin every 30 (thirty) days., Disp: , Rfl:    cyclobenzaprine (FLEXERIL) 5 MG tablet, Take 5 mg by mouth 3 (three) times daily as needed for muscle spasms., Disp: , Rfl:    diphenoxylate-atropine (LOMOTIL) 2.5-0.025 MG tablet, Take 1  tablet by mouth 4 (four) times daily as needed for diarrhea or loose stools., Disp: 60 tablet, Rfl: 0   escitalopram  (LEXAPRO) 20 MG tablet, Take 20 mg by mouth daily., Disp: , Rfl:    esomeprazole (NEXIUM) 40 MG capsule, Take 40 mg by mouth daily before breakfast. , Disp: , Rfl:    Eszopiclone 3 MG TABS, Take 3 mg by mouth at bedtime as needed (sleep). , Disp: , Rfl:    folic acid (FOLVITE) 1 MG tablet, Take 1 mg by mouth daily., Disp: , Rfl:    hydrocortisone 2.5 % cream, , Disp: , Rfl:    hydroxychloroquine (PLAQUENIL) 200 MG tablet, Take 200 mg by mouth daily., Disp: , Rfl:    hydrOXYzine (ATARAX) 10 MG tablet, Take 1 tablet (10 mg total) by mouth every 6 (six) hours as needed., Disp: 60 tablet, Rfl: 5   ibuprofen (ADVIL) 800 MG tablet, Take 800 mg by mouth every 8 (eight) hours as needed for moderate pain., Disp: , Rfl:    loperamide (IMODIUM) 2 MG capsule, Take 1 tablet (2 mg total) by mouth See admin instructions. Take 2 tablets with onset of diarrhea, then take 1 tablet every 2 hours until diarrhea stops. Maximum 8 tablets in 24, Disp: 90 capsule, Rfl: 0   loratadine (CLARITIN) 10 MG tablet, Take 10 mg by mouth daily. , Disp: , Rfl:    LORazepam (ATIVAN) 1 MG tablet, Take 1 mg by mouth 3 (three) times daily., Disp: , Rfl:    Magnesium 500 MG TABS, Take 500 mg by mouth 2 (two) times daily., Disp: , Rfl:    methocarbamol (ROBAXIN) 500 MG tablet, Take by mouth., Disp: , Rfl:    methotrexate (RHEUMATREX) 2.5 MG tablet, Take 25 mg by mouth every Sunday. 10 tablets once a week, Disp: , Rfl:    metoprolol tartrate (LOPRESSOR) 25 MG tablet, Take 0.5 tablets (12.5 mg total) by mouth as needed., Disp: 30 tablet, Rfl: 0   mupirocin ointment (BACTROBAN) 2 %, Place 1 application into the nose 2 (two) times daily. Use in each nostril twice daily for five (5) days., Disp: 22 g, Rfl: 5   nortriptyline (PAMELOR) 10 MG capsule, Take 30 mg by mouth at bedtime. , Disp: , Rfl:    oxyCODONE (OXY IR/ROXICODONE) 5 MG immediate release tablet, Take 1 tablet (5 mg total) by mouth every 6 (six) hours as needed for moderate pain or  severe pain., Disp: 90 tablet, Rfl: 0   phentermine (ADIPEX-P) 37.5 MG tablet, Take 37.5 mg by mouth daily before breakfast. , Disp: , Rfl:    polyethylene glycol (GOLYTELY) 236 g solution, , Disp: , Rfl:    pregabalin (LYRICA) 150 MG capsule, Take 150 mg by mouth 2 (two) times daily., Disp: , Rfl:    promethazine (PHENERGAN) 25 MG tablet, Take 1 tablet (25 mg total) by mouth every 8 (eight) hours as needed for nausea or vomiting., Disp: 90 tablet, Rfl: 0   pyridOXINE (VITAMIN B-6) 100 MG tablet, Take 100 mg by mouth daily., Disp: , Rfl:    topiramate (TOPAMAX) 50 MG tablet, Take 50 mg by mouth 2 (two) times daily. , Disp: , Rfl:    vitamin C (ASCORBIC ACID) 500 MG tablet, Take 500 mg by mouth daily., Disp: , Rfl:    XARELTO 10 MG TABS tablet, Take 10 mg by mouth daily., Disp: , Rfl:    doxycycline (VIBRA-TABS) 100 MG tablet, Take 1 tablet (100 mg total) by mouth 2 (two) times  daily. (Patient not taking: Reported on 08/04/2023), Disp: 10 tablet, Rfl: 0   VICTOZA 18 MG/3ML SOPN, Inject into the skin. (Patient not taking: Reported on 08/04/2023), Disp: , Rfl:  No current facility-administered medications for this visit.  Facility-Administered Medications Ordered in Other Visits:    heparin lock flush 100 unit/mL, 500 Units, Intravenous, Once, [name], MD   [COMPLETED] heparin lock flush 100 unit/mL, 500 Units, Intracatheter, Once PRN, [name], MD, 500 Units at 08/04/23 1105   pertuzumab (PERJETA) 420 mg in sodium chloride 0.9 % 250 mL chemo infusion, 420 mg, Intravenous, Once, [name], MD, Last Rate: 528 mL/hr at 08/04/23 1026, 420 mg at 08/04/23 1026   sodium chloride flush (NS) 0.9 % injection 10 mL, 10 mL, Intravenous, PRN, [name], MD, 10 mL at 11/19/18 1249   sodium chloride flush (NS) 0.9 % injection 10 mL, 10 mL, Intravenous, Once, [name], MD   Physical exam:  Vitals:   08/04/23 0838  BP: 114/76  Pulse: (!) 106  Temp: (!) 97.2 F (36.2 C)  ECOG 1 Physical Exam Constitutional:       Appearance: Normal appearance.  HENT:     Head: Normocephalic and atraumatic.  Eyes:     General: No scleral icterus. Neck:     Vascular: No JVD.  Cardiovascular:     Rate and Rhythm: Normal rate.  Pulmonary:     Effort: Pulmonary effort is normal. No respiratory distress.     Breath sounds: Normal breath sounds.  Abdominal:     General: There is no distension.     Palpations: Abdomen is soft.  Musculoskeletal:     Cervical back: Normal range of motion.     Comments: Right lower extremity with dressing.   Lymphadenopathy:     Cervical: No cervical adenopathy.  Skin:    General: Skin is warm.     Findings: No erythema or rash.  Neurological:     Mental Status: She is alert and oriented to person, place, and time. Mental status is at baseline.     Cranial Nerves: No cranial nerve deficit.     Motor: No abnormal muscle tone.  Psychiatric:        Mood and Affect: Mood and affect normal.        Labs     Latest Ref Rng & Units 08/04/2023    8:01 AM  CMP  Glucose 70 - 99 mg/dL 161   BUN 6 - 20 mg/dL 15   Creatinine 0.96 - 1.00 mg/dL 0.45   Sodium 409 - 811 mmol/L 137   Potassium 3.5 - 5.1 mmol/L 3.5   Chloride 98 - 111 mmol/L 107   CO2 22 - 32 mmol/L 23   Calcium 8.9 - 10.3 mg/dL 9.0   Total Protein 6.5 - 8.1 g/dL 7.7   Total Bilirubin 0.3 - 1.2 mg/dL 0.3   Alkaline Phos 38 - 126 U/L 59   AST 15 - 41 U/L 28   ALT 0 - 44 U/L 20       Latest Ref Rng & Units 08/04/2023    8:01 AM  CBC  WBC 4.0 - 10.5 K/uL 6.2   Hemoglobin 12.0 - 15.0 g/dL 91.4   Hematocrit 78.2 - 46.0 % 34.3   Platelets 150 - 400 K/uL 295    RADIOGRAPHIC STUDIES: I have personally reviewed the radiological images as listed and agreed with the findings in the report. ECHOCARDIOGRAM LIMITED  Result Date: 05/30/2023    ECHOCARDIOGRAM LIMITED  REPORT   Patient Name:   Theresa Chaney Date of Exam: 05/30/2023 Medical Rec #:  [id number]      Height:       67.0 in Accession #:    [id number]      Weight:       188.5 lb Date of Birth:  May 29, 1981      BSA:          1.972 m Patient Age:    42 years       BP:           107/70 mmHg Patient Gender: F              HR:           104 bpm. Exam Location:  [facility] Procedure: Limited Echo, 3D Echo, Limited Color Doppler and Strain Analysis Indications:    I42.80 Non-ischemic cardiomyopathy  History:        Patient has prior history of Echocardiogram examinations, most                 recent 02/24/2023. Cardiomyopathy, COPD, Arrythmias:Tachycardia,                 Signs/Symptoms:Murmur and Shortness of Breath; Risk                 Factors:Former Smoker. Last chemo round was 05/26/23.  Sonographer:    Quentin Ore RDMS, RVT, RDCS Referring Phys: [id number] Raymon Mutton DUNN  Sonographer Comments: Global longitudinal strain was attempted. IMPRESSIONS  1. Left ventricular ejection fraction, by estimation, is 50 to 55%. Left ventricular ejection fraction by PLAX is 53 %. The left ventricle has low normal function. The left ventricle has no regional wall motion abnormalities. Left ventricular diastolic parameters are indeterminate. The average left ventricular global longitudinal strain is -13.6 %.  2. Right ventricular systolic function is normal. The right ventricular size is normal. Tricuspid regurgitation signal is inadequate for assessing PA pressure.  3. The mitral valve is normal in structure. No evidence of mitral valve regurgitation.  4. The aortic valve is normal in structure. Aortic valve regurgitation is not visualized. No aortic stenosis is present.  5. The inferior vena cava is normal in size with greater than 50% respiratory variability, suggesting right atrial pressure of 3 mmHg. FINDINGS  Left Ventricle: Left ventricular ejection fraction, by estimation, is 50 to 55%. Left ventricular ejection fraction by PLAX is 53 %. The left ventricle has low normal function. The left ventricle has no regional wall motion abnormalities. Definity contrast agent was given IV to  delineate the left ventricular endocardial borders. The average left ventricular global longitudinal strain is -13.6 %. The left ventricular internal cavity size was normal in size. There is no left ventricular hypertrophy.  Left ventricular diastolic parameters are indeterminate. Right Ventricle: The right ventricular size is normal. No increase in right ventricular wall thickness. Right ventricular systolic function is normal. Tricuspid regurgitation signal is inadequate for assessing PA pressure. Left Atrium: Left atrial size was normal in size. Right Atrium: Right atrial size was normal in size. Pericardium: There is no evidence of pericardial effusion. Mitral Valve: The mitral valve is normal in structure. Tricuspid Valve: The tricuspid valve is normal in structure. Aortic Valve: The aortic valve is normal in structure. Aortic valve regurgitation is not visualized. No aortic stenosis is present. Pulmonic Valve: The pulmonic valve was normal in structure. Aorta: The aortic root is normal in size and structure. Venous: The inferior vena cava is normal  in size with greater than 50% respiratory variability, suggesting right atrial pressure of 3 mmHg. IAS/Shunts: No atrial level shunt detected by color flow Doppler. LEFT VENTRICLE PLAX 2D LV EF:         Left                ventricular     2D                ejection        Longitudinal                fraction by     Strain                PLAX is 53      2D Strain GLS  -13.6 %                %.              Avg: LVIDd:         5.10 cm LVIDs:         3.70 cm LV PW:         0.80 cm LV IVS:        0.70 cm         3D Volume EF:                                3D EF:        47 %                                LV EDV:       136 ml LV Volumes (MOD)               LV ESV:       72 ml LV vol d, MOD    99.5 ml       LV SV:        64 ml A2C: LV vol d, MOD    99.7 ml A4C: LV vol s, MOD    48.8 ml A2C: LV vol s, MOD    55.4 ml A4C: LV SV MOD A2C:   50.7 ml LV SV MOD A4C:   99.7 ml  LV SV MOD BP:    46.4 ml Julien Nordmann MD Electronically signed by Julien Nordmann MD Signature Date/Time: 05/30/2023/8:13:57 PM    Final

## 2023-08-04 NOTE — Patient Instructions (Signed)
[facility] [facility]  Discharge Instructions: Thank you for choosing [facility] to provide your oncology and hematology care.  If you have a lab appointment with the Cancer Center, please go directly to the Cancer Center and check in at the registration area.  Wear comfortable clothing and clothing appropriate for easy access to any Portacath or PICC line.   We strive to give you quality time with your provider. You may need to reschedule your appointment if you arrive late (15 or more minutes).  Arriving late affects you and other patients whose appointments are after yours.  Also, if you miss three or more appointments without notifying the office, you may be dismissed from the clinic at the provider's discretion.      For prescription refill requests, have your pharmacy contact our office and allow 72 hours for refills to be completed.    Today you received the following chemotherapy and/or immunotherapy agents Perjeta and Kanjinti.      To help prevent nausea and vomiting after your treatment, we encourage you to take your nausea medication as directed.  BELOW ARE SYMPTOMS THAT SHOULD BE REPORTED IMMEDIATELY: *FEVER GREATER THAN 100.4 F (38 C) OR HIGHER *CHILLS OR SWEATING *NAUSEA AND VOMITING THAT IS NOT CONTROLLED WITH YOUR NAUSEA MEDICATION *UNUSUAL SHORTNESS OF BREATH *UNUSUAL BRUISING OR BLEEDING *URINARY PROBLEMS (pain or burning when urinating, or frequent urination) *BOWEL PROBLEMS (unusual diarrhea, constipation, pain near the anus) TENDERNESS IN MOUTH AND THROAT WITH OR WITHOUT PRESENCE OF ULCERS (sore throat, sores in mouth, or a toothache) UNUSUAL RASH, SWELLING OR PAIN  UNUSUAL VAGINAL DISCHARGE OR ITCHING   Items with * indicate a potential emergency and should be followed up as soon as possible or go to the Emergency Department if any problems should occur.  Please show the CHEMOTHERAPY ALERT CARD or IMMUNOTHERAPY ALERT CARD at  check-in to the Emergency Department and triage nurse.  Should you have questions after your visit or need to cancel or reschedule your appointment, please contact [facility] [facility]  [phone] and follow the prompts.  Office hours are 8:00 a.m. to 4:30 p.m. Monday - Friday. Please note that voicemails left after 4:00 p.m. may not be returned until the following business day.  We are closed weekends and major holidays. You have access to a nurse at all times for urgent questions. Please call the main number to the clinic [phone] and follow the prompts.  For any non-urgent questions, you may also contact your provider using MyChart. We now offer e-Visits for anyone 69 and older to request care online for non-urgent symptoms. For details visit [email or web address].   Also download the MyChart app! Go to the app store, search "MyChart", open the app, select [place], and log in with your MyChart username and password.

## 2023-08-04 NOTE — Assessment & Plan Note (Signed)
continue Lyrica and nortriptyline symptoms stable 

## 2023-08-04 NOTE — Progress Notes (Signed)
Pt here for follow up. No new concerns voiced. Pt states that she would like sed rate lab collected today as she will see RA doctor in a few weeks

## 2023-08-04 NOTE — Assessment & Plan Note (Signed)
Follow up with  rheumatology.  Patient is on methotrexate,  plaquenil.  Add ESR per patient's request.

## 2023-08-04 NOTE — Assessment & Plan Note (Signed)
Recurrent breast cancer with isolated bone metastasis, stage IV #Initially stage IIIA right breast cancer, ER positive, HER-2 negative status post bilateral oophorectomy,Right mastectomy and right axillary lymph node dissection, status post implant, and implant removal.  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 ER positive, HER-2 positive  .- s/p Palliative radiation to T7  Labs reviewed and discussed Proceed with Kanjinti Perjecta.   Continue fulvestrant monthly.  

## 2023-08-04 NOTE — Assessment & Plan Note (Signed)
Continue calcium supplementation 600mg  TID.

## 2023-08-04 NOTE — Assessment & Plan Note (Addendum)
Chronic tachycardia Stable low-normal LVEF 50-55%. Follow up with cardiology. She gets ECHO Q3 months. 

## 2023-08-04 NOTE — Assessment & Plan Note (Signed)
status post radiation. Continue Zometa monthly, hold if Calcium is <8.6  - hold due to recent bone fracture/surgery - until 10/06/23 Continue calcium supplementation. Continue oxycodone as needed.

## 2023-08-04 NOTE — Assessment & Plan Note (Signed)
Antibody treatment plan as list above. 

## 2023-08-06 ENCOUNTER — Other Ambulatory Visit: Payer: Self-pay

## 2023-08-11 ENCOUNTER — Inpatient Hospital Stay: Payer: Medicare HMO

## 2023-08-11 ENCOUNTER — Other Ambulatory Visit: Payer: Self-pay

## 2023-08-11 DIAGNOSIS — C50411 Malignant neoplasm of upper-outer quadrant of right female breast: Secondary | ICD-10-CM

## 2023-08-11 DIAGNOSIS — Z5111 Encounter for antineoplastic chemotherapy: Secondary | ICD-10-CM | POA: Diagnosis not present

## 2023-08-11 MED ORDER — FULVESTRANT 250 MG/5ML IM SOSY
500.0000 mg | PREFILLED_SYRINGE | INTRAMUSCULAR | Status: DC
  Administered 2023-08-11: 500 mg via INTRAMUSCULAR
  Filled 2023-08-11: qty 10

## 2023-08-11 MED ORDER — FULVESTRANT 250 MG/5ML IM SOLN: 500.0000 mg | INTRAMUSCULAR | Status: DC

## 2023-08-18 ENCOUNTER — Other Ambulatory Visit: Payer: Medicare HMO

## 2023-08-18 ENCOUNTER — Ambulatory Visit: Payer: Medicare HMO

## 2023-08-18 ENCOUNTER — Ambulatory Visit: Payer: Medicare HMO | Admitting: Oncology

## 2023-08-25 ENCOUNTER — Inpatient Hospital Stay: Payer: Medicare HMO | Attending: Oncology

## 2023-08-25 ENCOUNTER — Inpatient Hospital Stay: Payer: Medicare HMO

## 2023-08-25 ENCOUNTER — Ambulatory Visit: Payer: Medicare HMO | Admitting: Oncology

## 2023-08-25 ENCOUNTER — Other Ambulatory Visit: Payer: Medicare HMO

## 2023-08-25 VITALS — BP 107/62 | HR 108 | Temp 95.7°F | Resp 18

## 2023-08-25 DIAGNOSIS — Z79899 Other long term (current) drug therapy: Secondary | ICD-10-CM | POA: Diagnosis not present

## 2023-08-25 DIAGNOSIS — C7951 Secondary malignant neoplasm of bone: Secondary | ICD-10-CM | POA: Diagnosis present

## 2023-08-25 DIAGNOSIS — Z5112 Encounter for antineoplastic immunotherapy: Secondary | ICD-10-CM | POA: Insufficient documentation

## 2023-08-25 DIAGNOSIS — Z170 Estrogen receptor positive status [ER+]: Secondary | ICD-10-CM

## 2023-08-25 DIAGNOSIS — C50411 Malignant neoplasm of upper-outer quadrant of right female breast: Secondary | ICD-10-CM | POA: Insufficient documentation

## 2023-08-25 LAB — COMPREHENSIVE METABOLIC PANEL
ALT: 20 U/L (ref 0–44)
AST: 29 U/L (ref 15–41)
Albumin: 4.5 g/dL (ref 3.5–5.0)
Alkaline Phosphatase: 70 U/L (ref 38–126)
Anion gap: 8 (ref 5–15)
BUN: 19 mg/dL (ref 6–20)
CO2: 20 mmol/L — ABNORMAL LOW (ref 22–32)
Calcium: 9.1 mg/dL (ref 8.9–10.3)
Chloride: 107 mmol/L (ref 98–111)
Creatinine, Ser: 1 mg/dL (ref 0.44–1.00)
GFR, Estimated: >60 mL/min (ref >60)
Glucose, Bld: 126 mg/dL — ABNORMAL HIGH (ref 70–99)
Potassium: 3.4 mmol/L — ABNORMAL LOW (ref 3.5–5.1)
Sodium: 135 mmol/L (ref 135–145)
Total Bilirubin: 0.4 mg/dL (ref 0.3–1.2)
Total Protein: 8.2 g/dL — ABNORMAL HIGH (ref 6.5–8.1)

## 2023-08-25 LAB — CBC WITH DIFFERENTIAL/PLATELET
Abs Immature Granulocytes: 0.03 10*3/uL (ref 0.00–0.07)
Basophils Absolute: 0.1 10*3/uL (ref 0.0–0.1)
Basophils Relative: 1 %
Eosinophils Absolute: 0.1 10*3/uL (ref 0.0–0.5)
Eosinophils Relative: 1 %
HCT: 36.9 % (ref 36.0–46.0)
Hemoglobin: 11.9 g/dL — ABNORMAL LOW (ref 12.0–15.0)
Immature Granulocytes: 0 %
Lymphocytes Relative: 24 %
Lymphs Abs: 1.8 10*3/uL (ref 0.7–4.0)
MCH: 28.3 pg (ref 26.0–34.0)
MCHC: 32.2 g/dL (ref 30.0–36.0)
MCV: 87.6 fL (ref 80.0–100.0)
Monocytes Absolute: 0.5 10*3/uL (ref 0.1–1.0)
Monocytes Relative: 7 %
Neutro Abs: 5.1 10*3/uL (ref 1.7–7.7)
Neutrophils Relative %: 67 %
Platelets: 356 10*3/uL (ref 150–400)
RBC: 4.21 MIL/uL (ref 3.87–5.11)
RDW: 13.7 % (ref 11.5–15.5)
WBC: 7.6 10*3/uL (ref 4.0–10.5)
nRBC: 0 % (ref 0.0–0.2)

## 2023-08-25 MED ORDER — TRASTUZUMAB-ANNS CHEMO 150 MG IV SOLR
6.0000 mg/kg | Freq: Once | INTRAVENOUS | Status: AC
  Administered 2023-08-25: 462 mg via INTRAVENOUS
  Filled 2023-08-25: qty 22

## 2023-08-25 MED ORDER — HEPARIN SOD (PORK) LOCK FLUSH 100 UNIT/ML IV SOLN
500.0000 [IU] | Freq: Once | INTRAVENOUS | Status: AC | PRN
  Administered 2023-08-25: 500 [IU]
  Filled 2023-08-25: qty 5

## 2023-08-25 MED ORDER — SODIUM CHLORIDE 0.9 % IV SOLN
Freq: Once | INTRAVENOUS | Status: AC
  Filled 2023-08-25: qty 250

## 2023-08-25 MED ORDER — SODIUM CHLORIDE 0.9 % IV SOLN
420.0000 mg | Freq: Once | INTRAVENOUS | Status: AC
  Administered 2023-08-25: 420 mg via INTRAVENOUS
  Filled 2023-08-25: qty 14

## 2023-08-25 NOTE — Patient Instructions (Addendum)
[place] [facility]  Discharge Instructions: Thank you for choosing [facility] to provide your oncology and hematology care.  If you have a lab appointment with the Cancer Center, please go directly to the Cancer Center and check in at the registration area.  Wear comfortable clothing and clothing appropriate for easy access to any Portacath or PICC line.   We strive to give you quality time with your provider. You may need to reschedule your appointment if you arrive late (15 or more minutes).  Arriving late affects you and other patients whose appointments are after yours.  Also, if you miss three or more appointments without notifying the office, you may be dismissed from the clinic at the provider's discretion.      For prescription refill requests, have your pharmacy contact our office and allow 72 hours for refills to be completed.    Today you received the following chemotherapy and/or immunotherapy agents kanjinti and perjeta      To help prevent nausea and vomiting after your treatment, we encourage you to take your nausea medication as directed.  BELOW ARE SYMPTOMS THAT SHOULD BE REPORTED IMMEDIATELY: *FEVER GREATER THAN 100.4 F (38 C) OR HIGHER *CHILLS OR SWEATING *NAUSEA AND VOMITING THAT IS NOT CONTROLLED WITH YOUR NAUSEA MEDICATION *UNUSUAL SHORTNESS OF BREATH *UNUSUAL BRUISING OR BLEEDING *URINARY PROBLEMS (pain or burning when urinating, or frequent urination) *BOWEL PROBLEMS (unusual diarrhea, constipation, pain near the anus) TENDERNESS IN MOUTH AND THROAT WITH OR WITHOUT PRESENCE OF ULCERS (sore throat, sores in mouth, or a toothache) UNUSUAL RASH, SWELLING OR PAIN  UNUSUAL VAGINAL DISCHARGE OR ITCHING   Items with * indicate a potential emergency and should be followed up as soon as possible or go to the Emergency Department if any problems should occur.  Please show the CHEMOTHERAPY ALERT CARD or IMMUNOTHERAPY ALERT CARD at  check-in to the Emergency Department and triage nurse.  Should you have questions after your visit or need to cancel or reschedule your appointment, please contact [facility]  [phone] and follow the prompts.  Office hours are 8:00 a.m. to 4:30 p.m. Monday - Friday. Please note that voicemails left after 4:00 p.m. may not be returned until the following business day.  We are closed weekends and major holidays. You have access to a nurse at all times for urgent questions. Please call the main number to the clinic [phone] and follow the prompts.  For any non-urgent questions, you may also contact your provider using MyChart. We now offer e-Visits for anyone 12 and older to request care online for non-urgent symptoms. For details visit [email or web address].   Also download the MyChart app! Go to the app store, search "MyChart", open the app, select [place], and log in with your MyChart username and password.

## 2023-08-27 NOTE — Progress Notes (Unsigned)
Cardiology Office Note    Date:  08/31/2023   ID:  Theresa Chaney, DOB Apr 02, 1981, MRN [id number]  PCP:  Leanna Sato, MD  Cardiologist:  Yvonne Kendall, MD  Electrophysiologist:  None   Chief Complaint: Follow up  History of Present Illness:   Theresa Chaney is a 42 y.o. female with history of recurrent breast cancer (initial diagnosis 10/19/2017 status post neoadjuvant ddAC x 4 post 1 cycle of Taxol status post right mastectomy status post 11 cycles of Taxol and radiation with prophylactic left mastectomy in 2020 with recurrence of malignancy noted on MRI of the thoracic spine in 06/2020 with hypermetabolic metastasis involving posterior elements of T7 on PET scan status post chemoradiation) complicated by a mildly reduced LVEF, WCT, paroxysmal SVT, lymphedema, COPD, anemia, migraine disorder, and GERD who presents for follow up of her cardiomyopathy.   She was previously evaluated in 10/2017 for intermittent sinus tachycardia and DOE. At that time echoes in 10/2017 and 04/2018 showed normal LV function and it was ultimately felt her symptoms were secondary to chemotherapy. In 08/2018, she experienced upper extremity swelling with repeat echo at that time showing an EF of 45% with question of anteroseptal hypokinesis and moderate right ventricular enlargement. Upon her primary cardiologist reviewing the echo, it was felt her EF was closer to 50-55%. She was placed on Toprol XL. Lexiscan Myoview in 10/2018 showed a medium defect of moderate severity present in the the basal anterior and mild anterior location, EF 55-65%. This was a very suboptimal study due to breast attenuation artifact as well as extracardiac uptake at the inferior border of the heart. There was reversible anterior wall defect suggestive of ischemia, though it was felt this was likely artifact given the defect did not extend to the apex. She was seen in the office in 11/2018 for follow up and was feeling well outside of  intermittent palpitations over the prior couple of weeks with report of heart rates of 150 bpm. Episodes would typically last a few minutes. In this setting, she wore a Zio monitor that showed the predominant rhythm was sinus with an average heart rate of 82 bpm (range 48-175 bpm). Rare PACs and PVCs were noted with a single atrial run lasting 4 beats. There were no sustained arrhythmias or prolonged pauses. Patient triggered events corresponded to sinus rhythm and artifact. In this setting, her Toprol XL was increased to 25 mg. She underwent echo on 02/19/2019 which showed improvement in her EF to 55-60%, normal diastolic function, and no significant valvular abnormalities. She was seen in the office in 02/2019 for presyncope. Repeat cardiac monitoring at that time showed a predominant rhythm of sinus with an average heart rate of 88 bpm (range 47-181 bpm in sinus), 2 episodes of WCT favored to be NSVT over SVT with aberrancy lasting up to 7 beats, and rare PACs/PVCs. She has continued to undergo periodic echoes for chemotherapy evaluation as outlined below.    She has undergone periodic echocardiograms for chemotherapy monitoring which have demonstrated:      Echo in 10/2017 demonstrated an EF of 60 to 65%, normal wall motion, grade 1 diastolic dysfunction, normal RV systolic function, normal PASP, and no significant valvular abnormalities.   Echo in 04/2018 showed an EF of 55 to 60%, normal wall motion, normal LV diastolic function.   Echo in 08/2018 showed an EF of 45%, diffuse hypokinesis, grade 1 diastolic dysfunction, mild mitral regurgitation, mild biatrial enlargement, and moderately dilated RV.   Echo  in 01/2020 demonstrated an EF of 60 to 65%, no regional wall motion abnormalities, normal RV systolic function and ventricular cavity size, and no significant valvular abnormalities.     Echo in 09/2020 continues to show a preserved LV systolic function with an EF of 60 to 65%, grade 1 diastolic  dysfunction, normal RV systolic function and ventricular cavity size, and no significant valvular abnormalities.     Echo in 01/2021 demonstrated an EF of 55-60%, no RWMA, normal RV systolic function and ventricular cavity size, and no significant valvular abnormalities.    Echo in 04/2021 showed an EF of 55 to 60%, no regional wall motion abnormalities, normal RV systolic function and ventricular cavity size, and no significant valvular abnormalities.   Echo in 08/2021 showed a low normal LV systolic function with an EF of 50 to 55%, normal LV diastolic function parameters, normal RV systolic function and ventricular cavity size, and no significant valvular abnormalities.     Echo from 09/28/2021 demonstrated an EF of 45%, global hypokinesis, normal RV systolic function and ventricular cavity size, and trivial mitral regurgitation.     Following noted drop in LV systolic function, trastuzumab and pertuzumab were held.  She was seen in follow-up in 09/2021 with relative hypotension precluding escalation of GDMT.  She underwent repeat echo in 11/2021 which demonstrated an EF of 45 to 50%, normal RV systolic function and ventricular cavity size, and no significant valvular abnormalities.  Given persistent mild LV dysfunction, Toprol was decreased to 12.5 mg and she was initiated on losartan 12.5 mg.  Subsequent repeat echo on 02/25/2022 demonstrated an improvement in her LV systolic function with an EF of 55 to 60%, no regional wall motion abnormalities, grade 1 diastolic dysfunction, normal RV systolic function and ventricular cavity size, no significant valvular abnormalities, and an estimated right atrial pressure of 3 mmHg.     Echo in 05/2022 showed an EF of 50 to 55%, no regional wall motion abnormalities, normal RV systolic function and ventricular cavity size, no significant valvular abnormalities, and an estimated right atrial pressure of 3 mmHg.   With noted lightheadedness/dizziness was  subsequently recommended she hold losartan in 06/2022.   Echo from 08/2022 55 to 55%, no regional wall motion abnormalities, grade 1 diastolic dysfunction, normal RV systolic function and ventricular cavity size, trivial mitral regurgitation, and an estimated right atrial pressure of 3 mmHg.   Echo from 11/2022 showed an EF of 50-55%, no regional wall motion abnormalities, indeterminate diastolic function, normal RV systolic function and ventricular cavity size, no significant valvular abnormalities, and an estimated right atrial pressure of 3 mmHg.   With ongoing fatigue and positional dizziness, Toprol-XL was held in 11/2022.  Given stability in LVEF, different of rechallenge an ARB/beta-blocker was deferred.   Echo from 02/2023 showed an EF of 50 to 55%, no regional wall motion abnormalities, normal LV diastolic function parameters, normal RV systolic function and ventricular cavity size, no significant valvular abnormalities, and an estimated right atrial pressure of 3 mmHg.  Most recent PET scan from 03/2023 was without evidence of hypermetabolic local recurrence or hypermetabolic metastatic disease.  She was last seen in the office in 05/2023 stable baseline fatigue and remains very active at baseline.  She noted some intermittent short-lived palpitations.  She had not needed any as needed metoprolol.  Echo in 05/2023 showed a stable EF of 50 to 55%, no regional wall motion abnormalities, normal RV systolic function and ventricular cavity size, and no significant valvular abnormalities.  She comes in doing well from a cardiac perspective and is without symptoms of angina or cardiac decompensation.  She remains active at baseline, though is limited in the setting of her right foot fracture now status post fifth metatarsal ORIF.  Palpitations well-controlled.  Has not needed any as needed metoprolol.  No dizziness, presyncope, or syncope.  Preliminary read from echo earlier this morning with a stable EF  of 50 to 55%.   Labs independently reviewed: 08/2023 - potassium 3.4, BUN 19, serum creatinine 1.00, albumin 4.5, AST/ALT normal, Hgb 11.9, PLT 356 03/2023 - magnesium 2.1 01/2023 - TSH normal  Past Medical History:  Diagnosis Date   Anemia    Arthritis    BRCA negative 11/26/2017   Breast cancer (HCC) 10/11/2017   Multifocal, ER positive, PR negative, HER-2 negative. ypT3 ypN2a 8.7 cm, 4/15 nodes   Cardiomyopathy (HCC)    a. 10/2017 Echo: EF 60-65%, no rwma, Gr1 DD, nl RV size/fxn; b. 04/2018 Echo: EF 55-60%, no rwma, Nl RV size/fxn; c. 08/2018 Echo: EF 45%, diff HK, ? HK of antsept wall. Gr1 DD. Mild MR. Mild LAE/RAE. Mod dil RV.    Chronic bronchitis (HCC) 11/2017   COPD (chronic obstructive pulmonary disease) (HCC)    MILD PER CXR   Depression    Family history of cancer    GERD (gastroesophageal reflux disease)    Headache    MIGRAINES   Heart murmur    ASYMPTOMATIC   Personal history of chemotherapy    current for right breast ca    Past Surgical History:  Procedure Laterality Date   AXILLARY LYMPH NODE DISSECTION Right 03/19/2018   Procedure: AXILLARY LYMPH NODE DISSECTION;  Surgeon: Earline Mayotte, MD;  Location: [facility] ORS;  Service: General;  Laterality: Right;   BIOPSY  06/23/2023   Procedure: BIOPSY;  Surgeon: Wyline Mood, MD;  Location: [facility] ENDOSCOPY;  Service: Gastroenterology;;   BREAST BIOPSY Right 10/11/2017   12:30 posterior coil clip invasive mammary carcinoma   BREAST BIOPSY Right 10/11/2017   11:30 middle depth ribbon clip DCIS   BREAST BIOPSY Right 10/11/2017   5:30 anterior depth x shape invasive ductal carcinoma   BREAST IMPLANT REMOVAL Right 06/03/2019   Procedure: REMOVAL OF RIGHT BREAST IMPLANTS;  Surgeon: Peggye Form, DO;  Location: [facility];  Service: Plastics;  Laterality: Right;   BREAST RECONSTRUCTION WITH PLACEMENT OF TISSUE EXPANDER AND FLEX HD (ACELLULAR HYDRATED DERMIS) Right 03/19/2018   Procedure: BREAST RECONSTRUCTION WITH  PLACEMENT OF TISSUE EXPANDER AND FLEX HD (ACELLULAR HYDRATED DERMIS);  Surgeon: Peggye Form, DO;  Location: [facility];  Service: Plastics;  Laterality: Right;   CARPAL TUNNEL RELEASE Bilateral 2020   CHOLECYSTECTOMY N/A 04/27/2020   Procedure: LAPAROSCOPIC CHOLECYSTECTOMY WITH INTRAOPERATIVE CHOLANGIOGRAM;  Surgeon: Earline Mayotte, MD;  Location: [facility];  Service: General;  Laterality: N/A;   COLONOSCOPY WITH PROPOFOL N/A 06/23/2023   Procedure: COLONOSCOPY WITH PROPOFOL;  Surgeon: Wyline Mood, MD;  Location: [facility]COPY;  Service: Gastroenterology;  Laterality: N/A;   ESOPHAGOGASTRODUODENOSCOPY (EGD) WITH PROPOFOL N/A 04/17/2020   Procedure: ESOPHAGOGASTRODUODENOSCOPY (EGD) WITH PROPOFOL;  Surgeon: Earline Mayotte, MD;  Location: [facility] ENDOSCOPY;  Service: Endoscopy;  Laterality: N/A;  with biopsy   ESOPHAGOGASTRODUODENOSCOPY (EGD) WITH PROPOFOL N/A 06/23/2023   Procedure: ESOPHAGOGASTRODUODENOSCOPY (EGD) WITH PROPOFOL;  Surgeon: Wyline Mood, MD;  Location: [facility] ENDOSCOPY;  Service: Gastroenterology;  Laterality: N/A;   LAPAROSCOPIC BILATERAL SALPINGO OOPHERECTOMY Bilateral 03/19/2018   Procedure: LAPAROSCOPIC BILATERAL SALPINGO OOPHORECTOMY;  Surgeon: Christeen Douglas, MD;  Location: [facility];  Service: Gynecology;  Laterality: Bilateral;   MASTECTOMY Right 03/2018   MASTECTOMY W/ SENTINEL NODE BIOPSY Right 03/19/2018   Procedure: MASTECTOMY WITH SENTINEL LYMPH NODE BIOPSY;  Surgeon: Earline Mayotte, MD;  Location: [facility];  Service: General;  Laterality: Right;   PORT-A-CATH REMOVAL Left 06/03/2019   Procedure: REMOVAL PORT-A-CATH;  Surgeon: Earline Mayotte, MD;  Location: [facility];  Service: General;  Laterality: Left;   PORTACATH PLACEMENT Left 10/24/2017   Procedure: INSERTION PORT-A-CATH;  Surgeon: Earline Mayotte, MD;  Location: [facility];  Service: General;  Laterality: Left;   PORTACATH PLACEMENT Right 09/28/2020   Procedure: INSERTION PORT-A-CATH;  Surgeon: Earline Mayotte, MD;  Location: [facility];  Service: General;  Laterality: Right;   REMOVAL OF TISSUE EXPANDER AND PLACEMENT OF IMPLANT Right 07/20/2018   Procedure: REMOVAL OF RIGHT BREAST TISSUE EXPANDER AND PLACEMENT OF IMPLANT;  Surgeon: Peggye Form, DO;  Location: [facility];  Service: Plastics;  Laterality: Right;   SIMPLE MASTECTOMY WITH AXILLARY SENTINEL NODE BIOPSY Left 06/03/2019   Procedure: SIMPLE MASTECTOMY LEFT;  Surgeon: Earline Mayotte, MD;  Location: [facility];  Service: General;  Laterality: Left;    Current Medications: Current Meds  Medication Sig   acetaminophen (TYLENOL) 500 MG tablet Take 500 mg by mouth every 6 (six) hours as needed.   albuterol (VENTOLIN HFA) 108 (90 Base) MCG/ACT inhaler Inhale 2 puffs into the lungs every 6 (six) hours as needed for wheezing or shortness of breath.    CALCIUM CARBONATE-VITAMIN D PO Take 600-800 mg by mouth in the morning, at noon, and at bedtime.   chlorhexidine (PERIDEX) 0.12 % solution Use as directed 15 mLs in the mouth or throat 2 (two) times daily.   cyanocobalamin (,VITAMIN B-12,) 1000 MCG/ML injection Inject 1,000 mcg into the skin every 30 (thirty) days.   cyclobenzaprine (FLEXERIL) 5 MG tablet Take 5 mg by mouth 3 (three) times daily as needed for muscle spasms.   diphenoxylate-atropine (LOMOTIL) 2.5-0.025 MG tablet Take 1 tablet by mouth 4 (four) times daily as needed for diarrhea or loose stools.   escitalopram (LEXAPRO) 20 MG tablet Take 20 mg by mouth daily.   esomeprazole (NEXIUM) 40 MG capsule Take 40 mg by mouth daily before breakfast.    Eszopiclone 3 MG TABS Take 3 mg by mouth at bedtime as needed (sleep).    folic acid (FOLVITE) 1 MG tablet Take 1 mg by mouth daily.   hydrocortisone 2.5 % cream    hydroxychloroquine (PLAQUENIL) 200 MG tablet Take 200 mg by mouth daily.   hydrOXYzine (ATARAX) 10 MG tablet Take 1 tablet (10 mg total) by mouth every 6 (six) hours as needed.   ibuprofen (ADVIL) 800 MG  tablet Take 800 mg by mouth every 8 (eight) hours as needed for moderate pain.   loperamide (IMODIUM) 2 MG capsule Take 1 tablet (2 mg total) by mouth See admin instructions. Take 2 tablets with onset of diarrhea, then take 1 tablet every 2 hours until diarrhea stops. Maximum 8 tablets in 24   loratadine (CLARITIN) 10 MG tablet Take 10 mg by mouth daily.    LORazepam (ATIVAN) 1 MG tablet Take 1 mg by mouth 3 (three) times daily.   Magnesium 500 MG TABS Take 500 mg by mouth 2 (two) times daily.   methocarbamol (ROBAXIN) 500 MG tablet Take by mouth.   methotrexate (RHEUMATREX) 2.5 MG tablet Take 25 mg by mouth every Sunday. 10 tablets once  a week   metoprolol tartrate (LOPRESSOR) 25 MG tablet Take 0.5 tablets (12.5 mg total) by mouth as needed.   mupirocin ointment (BACTROBAN) 2 % Place 1 application into the nose 2 (two) times daily. Use in each nostril twice daily for five (5) days.   nortriptyline (PAMELOR) 10 MG capsule Take 30 mg by mouth at bedtime.    oxyCODONE (OXY IR/ROXICODONE) 5 MG immediate release tablet Take 1 tablet (5 mg total) by mouth every 6 (six) hours as needed for moderate pain or severe pain.   phentermine (ADIPEX-P) 37.5 MG tablet Take 37.5 mg by mouth daily before breakfast.    pregabalin (LYRICA) 150 MG capsule Take 150 mg by mouth 2 (two) times daily.   promethazine (PHENERGAN) 25 MG tablet Take 1 tablet (25 mg total) by mouth every 8 (eight) hours as needed for nausea or vomiting.   pyridOXINE (VITAMIN B-6) 100 MG tablet Take 100 mg by mouth daily.   topiramate (TOPAMAX) 50 MG tablet Take 50 mg by mouth 2 (two) times daily.    vitamin C (ASCORBIC ACID) 500 MG tablet Take 500 mg by mouth daily.    Allergies:   Patient has no known allergies.   Social History   Socioeconomic History   Marital status: Married    Spouse name: Not on file   Number of children: Not on file   Years of education: Not on file   Highest education level: Not on file  Occupational History    Occupation: pharmacy tech    Comment: Acupuncturist [employer]   Tobacco Use   Smoking status: Former    Current packs/day: 0.00    Average packs/day: 0.5 packs/day for 18.0 years (9.0 ttl pk-yrs)    Types: Cigarettes    Start date: 06/21/2018    Quit date: 12/11/2022    Years since quitting: 0.7   Smokeless tobacco: Never  Vaping Use   Vaping status: Never Used  Substance and Sexual Activity   Alcohol use: No   Drug use: No   Sexual activity: Yes    Birth control/protection: Injection, Other-see comments    Comment: has had hysterectomy  Other Topics Concern   Not on file  Social History Narrative   Lives at home with husband and daughter   Social Determinants of Health   Financial Resource Strain: Not on file  Food Insecurity: Not on file  Transportation Needs: Not on file  Physical Activity: Not on file  Stress: Not on file  Social Connections: Not on file     Family History:  The patient's family history includes Bladder Cancer in her maternal grandmother; Breast cancer in her paternal aunt; Cervical cancer (age of onset: 53) in her maternal aunt; Diabetes in her father; Heart attack (age of onset: 77) in her father; Hyperlipidemia in her father; Hypertension in her father; Lung cancer in her maternal aunt; Melanoma in her maternal aunt and maternal uncle.  ROS:   12-point review of systems is negative unless otherwise noted in the HPI.   EKGs/Labs/Other Studies Reviewed:    Studies reviewed were summarized above. The additional studies were reviewed today:  Limited echo 05/30/2023: 1. Left ventricular ejection fraction, by estimation, is 50 to 55%. Left  ventricular ejection fraction by PLAX is 53 %. The left ventricle has low  normal function. The left ventricle has no regional wall motion  abnormalities. Left ventricular diastolic  parameters are indeterminate. The average left ventricular global  longitudinal strain is -13.6 %.  2. Right  ventricular systolic function is normal. The right ventricular  size is normal. Tricuspid regurgitation signal is inadequate for assessing  PA pressure.   3. The mitral valve is normal in structure. No evidence of mitral valve  regurgitation.   4. The aortic valve is normal in structure. Aortic valve regurgitation is  not visualized. No aortic stenosis is present.   5. The inferior vena cava is normal in size with greater than 50%  respiratory variability, suggesting right atrial pressure of 3 mmHg.  __________  Limited echo 02/24/2023: 1. Left ventricular ejection fraction, by estimation, is 50 to 55%. Left  ventricular ejection fraction by 3D volume is 52 %. The left ventricle has  low normal function. The left ventricle has no regional wall motion  abnormalities. Left ventricular  diastolic parameters were normal. The average left ventricular global  longitudinal strain is -18.7 %.   2. Right ventricular systolic function is normal. The right ventricular  size is normal.   3. The mitral valve is normal in structure. No evidence of mitral valve  regurgitation. No evidence of mitral stenosis.   4. The aortic valve has an indeterminant number of cusps. Aortic valve  regurgitation is not visualized. No aortic stenosis is present.   5. The inferior vena cava is normal in size with greater than 50%  respiratory variability, suggesting right atrial pressure of 3 mmHg.  __________   Limited echo 11/25/2022: 1. Left ventricular ejection fraction, by estimation, is 50 to 55%. The  left ventricle has low normal function. The left ventricle has no regional  wall motion abnormalities. Left ventricular diastolic parameters are  indeterminate. The average left  ventricular global longitudinal strain is -10.5 %. The global longitudinal  strain is abnormal.   2. Right ventricular systolic function is normal. The right ventricular  size is normal.   3. The mitral valve is normal in structure. No  evidence of mitral valve  regurgitation. No evidence of mitral stenosis.   4. The aortic valve was not well visualized. Aortic valve regurgitation  is not visualized. No aortic stenosis is present.   5. The inferior vena cava is normal in size with greater than 50%  respiratory variability, suggesting right atrial pressure of 3 mmHg.  ___________   Limited echo 08/23/2022: 1. Left ventricular ejection fraction, by estimation, is 50 to 55%. The  left ventricle has low normal function. The left ventricle has no regional  wall motion abnormalities. Left ventricular diastolic parameters are  consistent with Grade I diastolic  dysfunction (impaired relaxation). The average left ventricular global  longitudinal strain is -15.4 %.   2. Right ventricular systolic function is normal. The right ventricular  size is normal.   3. The mitral valve is normal in structure. Trivial mitral valve  regurgitation. No evidence of mitral stenosis.   4. The aortic valve was not well visualized. Aortic valve regurgitation  is not visualized. No aortic stenosis is present.   5. The inferior vena cava is normal in size with greater than 50%  respiratory variability, suggesting right atrial pressure of 3 mmHg.  __________   Limited echo 05/17/2022: 1. Left ventricular ejection fraction, by estimation, is 50 to 55%. The  left ventricle has low normal function. The left ventricle has no regional  wall motion abnormalities. The average left ventricular global  longitudinal strain is -11.7 %.   2. Right ventricular systolic function is normal. The right ventricular  size is normal.   3. The mitral  valve is normal in structure. No evidence of mitral valve  regurgitation. No evidence of mitral stenosis.   4. The aortic valve is normal in structure. Aortic valve regurgitation is  not visualized. No aortic stenosis is present.   5. The inferior vena cava is normal in size with greater than 50%  respiratory  variability, suggesting right atrial pressure of 3 mmHg.   Comparison(s): 02/25/2022-EF 60%-65%.  __________   2D echo 02/25/2022: 1. Left ventricular ejection fraction, by estimation, is 55 to 60%. The  left ventricle has normal function. The left ventricle has no regional  wall motion abnormalities. Left ventricular diastolic parameters are  consistent with Grade I diastolic  dysfunction (impaired relaxation).   2. Right ventricular systolic function is normal. The right ventricular  size is normal.   3. The mitral valve is normal in structure. No evidence of mitral valve  regurgitation. No evidence of mitral stenosis.   4. The aortic valve is normal in structure. Aortic valve regurgitation is  not visualized. No aortic stenosis is present.   5. The inferior vena cava is normal in size with greater than 50%  respiratory variability, suggesting right atrial pressure of 3 mmHg.   Comparison(s): Previous limited echo reported LVEF of 45-50%. __________   Limited echo 11/11/2021: 1. Left ventricular ejection fraction, by estimation, is 45 to 50%. Left  ventricular ejection fraction by 2D MOD biplane is 46.8 %. The left  ventricle has mildly decreased function.   2. Right ventricular systolic function is normal. The right ventricular  size is normal.   3. The mitral valve is normal in structure. No evidence of mitral valve  regurgitation. __________   2D echo 09/28/2021: 1. Left ventricular ejection fraction, by estimation, is 45%. The left  ventricle has mild to moderately decreased function. The left ventricle  demonstrates global hypokinesis. Left ventricular diastolic parameters  were normal. The average left  ventricular global longitudinal strain is -15.0 %. The global longitudinal  strain is abnormal.   2. Right ventricular systolic function is normal. The right ventricular  size is normal. Tricuspid regurgitation signal is inadequate for assessing  PA pressure.   3. The  mitral valve is normal in structure. Trivial mitral valve  regurgitation. No evidence of mitral stenosis.   4. The aortic valve is tricuspid. Aortic valve regurgitation is not  visualized. No aortic stenosis is present.   Comparison(s): A prior study was performed on 08/31/2021. The left  ventricular function is slightly worse.  __________   2D echo 08/31/2021: 1. Left ventricular ejection fraction, by estimation, is 50 to 55%. The  left ventricle has low normal function. Left ventricular endocardial  border not optimally defined to evaluate regional wall motion. Left  ventricular diastolic parameters were  normal. The average left ventricular global longitudinal strain is -13.4  %. The global longitudinal strain is abnormal.   2. Right ventricular systolic function is normal. The right ventricular  size is normal.   3. The mitral valve is normal in structure. No evidence of mitral valve  regurgitation. No evidence of mitral stenosis.   4. The aortic valve is tricuspid. Aortic valve regurgitation is trivial.  No aortic stenosis is present.  __________   Limited 2D echo 05/05/2021: 1. Left ventricular ejection fraction, by estimation, is 55 to 60%. The  left ventricle has normal function. The left ventricle has no regional  wall motion abnormalities.   2. Right ventricular systolic function is normal. The right ventricular  size is normal.  3. The mitral valve is normal in structure. No evidence of mitral valve  regurgitation.   4. The aortic valve is tricuspid. Aortic valve regurgitation is not  visualized.  __________   Limited 2D echo 01/29/2021:  1. Left ventricular ejection fraction, by estimation, is 55 to 60%. The  left ventricle has normal function. The left ventricle has no regional  wall motion abnormalities. Left ventricular diastolic function could not  be evaluated.   2. Right ventricular systolic function is normal. The right ventricular  size is normal.   3. The  mitral valve is normal in structure. Trivial mitral valve  regurgitation.   4. The aortic valve is normal in structure. Aortic valve regurgitation is  not visualized.  __________   Limited 2D echo 09/14/2020: 1. Left ventricular ejection fraction, by estimation, is 60 to 65%. Left  ventricular ejection fraction by PLAX is 68 %. The left ventricle has  normal function. Left ventricular diastolic parameters are consistent with  Grade I diastolic dysfunction  (impaired relaxation).   2. Right ventricular systolic function is normal. The right ventricular  size is normal.   3. The mitral valve was not well visualized. No evidence of mitral valve  regurgitation.   4. Aortic valve regurgitation is not visualized.  __________   2D echo 01/29/2020: 1. Left ventricular ejection fraction, by estimation, is 60 to 65%. The  left ventricle has normal function. The left ventricle has no regional  wall motion abnormalities. Indeterminate diastolic filling due to E-A  fusion.   2. Right ventricular systolic function is normal. The right ventricular  size is normal.   3. The mitral valve is normal in structure and function. No evidence of  mitral valve regurgitation. No evidence of mitral stenosis.   4. The aortic valve is normal in structure and function. Aortic valve  regurgitation is not visualized. No aortic stenosis is present.  __________   Luci Bank patch 02/2019: The patient was monitored for 7 days. The predominant rhythm was sinus with an average rate of 88 bpm (range 47 to 181 bpm in sinus). Rare PAC's and PVC's were noted. Two episodes of wide-complex tachycardia (favor nonsustained ventricular tachycardia over supraventricular tachycardia with aberrancy) occurred, lasting up to 7 beats. Patient triggered events correspond to normal sinus rhythm and narrow complex tachycardia (favor sinus tachycardia).   Predominantly sinus rhythm with two brief episodes of wide-complex tachycardia (favor  NSVT over SVT with aberrancy).  Rare PAC's and PVC's also noted.  Patient triggered events correspond to sinus rhythm and narrow complex tachycardia (favor sinus tachycardia but cannot exclude atrial tachycardia). __________   Limited 2D echo 02/19/2019: 1. The left ventricle has normal systolic function, with an ejection  fraction of 55-60%. Left ventricular diastolic parameters were normal.   2. The right ventricle has normal systolc function. The cavity was  normal. There is no increase in right ventricular wall thickness.   3. The aortic valve was not well visualized. __________   Luci Bank patch 11/2018: The patient was monitored for 13 days, 8 hours. The predominant rhythm was sinus with an average rate of 82 bpm (range 48 to 175 bpm). Rare PACs and PVCs were noted. A single atrial run lasting 4 beats was noted. There was no sustained arrhythmia or prolonged pause. Patient triggered events correspond to sinus rhythm and artifact.   Predominantly sinus rhythm with rare PACs and PVCs.  Brief atrial run lasting 4 beats noted. __________   Eugenie Birks MPI 10/12/2018: T wave inversion was noted  during stress in the III, II, aVF, V3, V2, V4, V5 and V6 leads, and returning to baseline after 1-5 mins of recovery. There was no ST segment deviation noted during stress. Defect 1: There is a medium defect of moderate severity present in the basal anterior and mid anterior location. The left ventricular ejection fraction is normal (55-65%). Very suboptimal study due to breast attenuation artifact as well as extracardiac uptake at the inferior border of the heart. There is a reversible anterior wall defect suggestive of ischemia. However, I suspect that this is likely artifact given that the defect does not extend to the apex. Correlate clinically. __________   2D echo 08/15/2018: - Left ventricle: The cavity size was mildly dilated. Systolic    function was mildly reduced. The estimated ejection fraction  was    45%. Diffuse hypokinesis. Hypokinesis of the anteroseptal    myocardium. Doppler parameters are consistent with abnormal left    ventricular relaxation (grade 1 diastolic dysfunction).  - Aortic valve: Valve area (VTI): 1.88 cm^2. Valve area (Vmax): 1.8    cm^2. Valve area (Vmean): 1.74 cm^2.  - Mitral valve: There was mild regurgitation. Valve area by    continuity equation (using LVOT flow): 2.22 cm^2.  - Left atrium: The atrium was mildly dilated.  - Right ventricle: The cavity size was moderately dilated.  - Right atrium: The atrium was mildly dilated.   Impressions:   - Mild LV systolic dysfunction with wall motion abnormalities    suggestive of CAD.  __________   2D echo 04/26/2018: - Left ventricle: The cavity size was normal. Wall thickness was    normal. Systolic function was normal. The estimated ejection    fraction was in the range of 55% to 60%. Wall motion was normal;    there were no regional wall motion abnormalities. Left    ventricular diastolic function parameters were normal.  - Pulmonary arteries: Systolic pressure could not be accurately    estimated.  __________   2D echo 10/26/2017: - Left ventricle: The cavity size was normal. Systolic function was    normal. The estimated ejection fraction was in the range of 60%    to 65%. Wall motion was normal; there were no regional wall    motion abnormalities. Doppler parameters are consistent with    abnormal left ventricular relaxation (grade 1 diastolic    dysfunction).  - Left atrium: The atrium was normal in size.  - Right ventricle: Systolic function was normal.  - Pulmonary arteries: Systolic pressure was within the normal    range.    EKG:  EKG is not ordered today.    Recent Labs: 01/20/2023: TSH 1.318 03/24/2023: Magnesium 2.1 08/25/2023: ALT 20; BUN 19; Creatinine, Ser 1.00; Hemoglobin 11.9; Platelets 356; Potassium 3.4; Sodium 135  Recent Lipid Panel No results found for: "CHOL", "TRIG",  "HDL", "CHOLHDL", "VLDL", "LDLCALC", "LDLDIRECT"  PHYSICAL EXAM:    VS:  BP 110/64 (BP Location: Left Arm, Patient Position: Sitting, Cuff Size: Normal)   Pulse (!) 104   Ht 5\' 7"  (1.702 m)   Wt 180 lb (81.6 kg)   LMP  (LMP Unknown) Comment: Ovaries and tubes removed.April 2019  SpO2 98%   BMI 28.19 kg/m   BMI: Body mass index is 28.19 kg/m.  Physical Exam Vitals reviewed.  Constitutional:      Appearance: She is well-developed.  HENT:     Head: Normocephalic and atraumatic.  Eyes:     General:  Right eye: No discharge.        Left eye: No discharge.  Neck:     Vascular: No JVD.  Cardiovascular:     Rate and Rhythm: Normal rate and regular rhythm.     Heart sounds: Normal heart sounds, S1 normal and S2 normal. Heart sounds not distant. No midsystolic click and no opening snap. No murmur heard.    No friction rub.  Pulmonary:     Effort: Pulmonary effort is normal. No respiratory distress.     Breath sounds: Normal breath sounds. No decreased breath sounds, wheezing or rales.  Chest:     Chest wall: No tenderness.  Abdominal:     General: There is no distension.  Musculoskeletal:     Cervical back: Normal range of motion.     Left lower leg: No edema.     Comments: Right leg in Aircast walking boot.  Skin:    General: Skin is warm and dry.     Nails: There is no clubbing.  Neurological:     Mental Status: She is alert and oriented to person, place, and time.  Psychiatric:        Speech: Speech normal.        Behavior: Behavior normal.        Thought Content: Thought content normal.        Judgment: Judgment normal.     Wt Readings from Last 3 Encounters:  08/31/23 180 lb (81.6 kg)  07/07/23 168 lb (76.2 kg)  06/23/23 183 lb 6.4 oz (83.2 kg)     ASSESSMENT & PLAN:   Chemotherapy-induced systolic dysfunction/NICM: Euvolemic and well compensated. Preliminary read on limited echo performed earlier this morning showed a stable low normal LV systolic  function at 50 to 55% off beta-blocker and ARB. Given stability of LVEF, and in the context of relative hypotension and underlying fatigue, we have elected to defer rechallenge of ARB or beta-blocker. She prefers to defer escalation of medical therapy at this time. Should she have recurrence of cardiomyopathy, or develops symptoms of volume overload, would look to escalate GDMT at that time. Continue active lifestyle. No indication for standing loop diuretic.  Continue to monitor echo every 3 months.  WCT/paroxysmal SVT/palpitations: Quiescent.  Continue as needed Lopressor 12.5 mg for sustained tachypalpitations.    Recurrent breast cancer with isolated bone metastasis: Most recent PET scan from 03/2023 showed no evidence of local or metastatic hypermetabolic activity.  Ongoing management per oncology.   Disposition: F/u with Dr. Okey Dupre or an APP in 3 months.   Medication Adjustments/Labs and Tests Ordered: Current medicines are reviewed at length with the patient today.  Concerns regarding medicines are outlined above. Medication changes, Labs and Tests ordered today are summarized above and listed in the Patient Instructions accessible in Encounters.   SignedEula Listen, PA-C 08/31/2023 10:02 AM     [facility] - [place] [address] [place], [place] [address] [phone]

## 2023-08-28 ENCOUNTER — Encounter: Payer: Self-pay | Admitting: Oncology

## 2023-08-31 ENCOUNTER — Ambulatory Visit: Payer: Medicare HMO | Attending: Physician Assistant

## 2023-08-31 ENCOUNTER — Encounter: Payer: Self-pay | Admitting: Physician Assistant

## 2023-08-31 ENCOUNTER — Ambulatory Visit (INDEPENDENT_AMBULATORY_CARE_PROVIDER_SITE_OTHER): Payer: Medicare HMO | Admitting: Physician Assistant

## 2023-08-31 VITALS — BP 110/64 | HR 104 | Ht 67.0 in | Wt 180.0 lb

## 2023-08-31 DIAGNOSIS — I428 Other cardiomyopathies: Secondary | ICD-10-CM | POA: Diagnosis not present

## 2023-08-31 DIAGNOSIS — T451X5A Adverse effect of antineoplastic and immunosuppressive drugs, initial encounter: Secondary | ICD-10-CM

## 2023-08-31 DIAGNOSIS — C50411 Malignant neoplasm of upper-outer quadrant of right female breast: Secondary | ICD-10-CM

## 2023-08-31 DIAGNOSIS — R000 Tachycardia, unspecified: Secondary | ICD-10-CM

## 2023-08-31 DIAGNOSIS — I471 Supraventricular tachycardia, unspecified: Secondary | ICD-10-CM

## 2023-08-31 DIAGNOSIS — T451X5D Adverse effect of antineoplastic and immunosuppressive drugs, subsequent encounter: Secondary | ICD-10-CM

## 2023-08-31 DIAGNOSIS — Z170 Estrogen receptor positive status [ER+]: Secondary | ICD-10-CM

## 2023-08-31 DIAGNOSIS — I427 Cardiomyopathy due to drug and external agent: Secondary | ICD-10-CM

## 2023-08-31 LAB — ECHOCARDIOGRAM LIMITED
Calc EF: 47.4 %
S' Lateral: 3.2 cm
Single Plane A2C EF: 43.8 %
Single Plane A4C EF: 50.4 %

## 2023-08-31 MED ORDER — PERFLUTREN LIPID MICROSPHERE
1.0000 mL | INTRAVENOUS | Status: AC | PRN
  Administered 2023-08-31: 3 mL via INTRAVENOUS

## 2023-08-31 NOTE — Patient Instructions (Signed)
Medication Instructions:  Your Physician recommend you continue on your current medication as directed.    *If you need a refill on your cardiac medications before your next appointment, please call your pharmacy*   Lab Work: None If you have labs (blood work) drawn today and your tests are completely normal, you will receive your results only by: MyChart Message (if you have MyChart) OR A paper copy in the mail If you have any lab test that is abnormal or we need to change your treatment, we will call you to review the results.   Testing/Procedures: Your physician has requested that you have an LIMITED echocardiogram in 3 months. Echocardiography is a painless test that uses sound waves to create images of your heart. It provides your doctor with information about the size and shape of your heart and how well your heart's chambers and valves are working.   You may receive an ultrasound enhancing agent through an IV if needed to better visualize your heart during the echo. This procedure takes approximately one hour.  There are no restrictions for this procedure.  This will take place at [address] [address], [place] [address]    Follow-Up: At [facility], you and your health needs are our priority.  As part of our continuing mission to provide you with exceptional heart care, we have created designated Provider Care Teams.  These Care Teams include your primary Cardiologist (physician) and Advanced Practice Providers (APPs -  Physician Assistants and Nurse Practitioners) who all work together to provide you with the care you need, when you need it.  We recommend signing up for the patient portal called "MyChart".  Sign up information is provided on this After Visit Summary.  MyChart is used to connect with patients for Virtual Visits (Telemedicine).  Patients are able to view lab/test results, encounter notes, upcoming appointments, etc.  Non-urgent  messages can be sent to your provider as well.   To learn more about what you can do with MyChart, go to [email or web address].    Your next appointment:   3 month(s)  Provider:   You may see Yvonne Kendall, MD or one of the following Advanced Practice Providers on your designated Care Team:   Eula Listen, [place]

## 2023-09-02 ENCOUNTER — Other Ambulatory Visit: Payer: Self-pay

## 2023-09-04 NOTE — Telephone Encounter (Signed)
Patient is questioning her EF

## 2023-09-08 ENCOUNTER — Inpatient Hospital Stay: Payer: Medicare HMO

## 2023-09-08 DIAGNOSIS — Z170 Estrogen receptor positive status [ER+]: Secondary | ICD-10-CM

## 2023-09-08 DIAGNOSIS — Z5112 Encounter for antineoplastic immunotherapy: Secondary | ICD-10-CM | POA: Diagnosis not present

## 2023-09-08 MED ORDER — FULVESTRANT 250 MG/5ML IM SOSY
500.0000 mg | PREFILLED_SYRINGE | INTRAMUSCULAR | Status: DC
  Administered 2023-09-08: 500 mg via INTRAMUSCULAR
  Filled 2023-09-08: qty 10

## 2023-09-15 ENCOUNTER — Inpatient Hospital Stay: Payer: Medicare HMO

## 2023-09-15 ENCOUNTER — Inpatient Hospital Stay: Payer: Medicare HMO | Attending: Oncology

## 2023-09-15 ENCOUNTER — Inpatient Hospital Stay (HOSPITAL_BASED_OUTPATIENT_CLINIC_OR_DEPARTMENT_OTHER): Payer: Medicare HMO | Admitting: Oncology

## 2023-09-15 ENCOUNTER — Encounter: Payer: Self-pay | Admitting: Oncology

## 2023-09-15 VITALS — BP 115/76 | HR 102 | Temp 96.0°F | Resp 18 | Wt 180.1 lb

## 2023-09-15 DIAGNOSIS — C50411 Malignant neoplasm of upper-outer quadrant of right female breast: Secondary | ICD-10-CM | POA: Insufficient documentation

## 2023-09-15 DIAGNOSIS — C7951 Secondary malignant neoplasm of bone: Secondary | ICD-10-CM | POA: Insufficient documentation

## 2023-09-15 DIAGNOSIS — I428 Other cardiomyopathies: Secondary | ICD-10-CM | POA: Diagnosis not present

## 2023-09-15 DIAGNOSIS — C50811 Malignant neoplasm of overlapping sites of right female breast: Secondary | ICD-10-CM

## 2023-09-15 DIAGNOSIS — M069 Rheumatoid arthritis, unspecified: Secondary | ICD-10-CM

## 2023-09-15 DIAGNOSIS — Z79899 Other long term (current) drug therapy: Secondary | ICD-10-CM | POA: Insufficient documentation

## 2023-09-15 DIAGNOSIS — Z5112 Encounter for antineoplastic immunotherapy: Secondary | ICD-10-CM | POA: Diagnosis present

## 2023-09-15 DIAGNOSIS — T451X5A Adverse effect of antineoplastic and immunosuppressive drugs, initial encounter: Secondary | ICD-10-CM

## 2023-09-15 DIAGNOSIS — Z170 Estrogen receptor positive status [ER+]: Secondary | ICD-10-CM

## 2023-09-15 DIAGNOSIS — E876 Hypokalemia: Secondary | ICD-10-CM

## 2023-09-15 DIAGNOSIS — G620 Drug-induced polyneuropathy: Secondary | ICD-10-CM

## 2023-09-15 LAB — CBC WITH DIFFERENTIAL/PLATELET
Abs Immature Granulocytes: 0.02 10*3/uL (ref 0.00–0.07)
Basophils Absolute: 0 10*3/uL (ref 0.0–0.1)
Basophils Relative: 1 %
Eosinophils Absolute: 0.1 10*3/uL (ref 0.0–0.5)
Eosinophils Relative: 2 %
HCT: 31.2 % — ABNORMAL LOW (ref 36.0–46.0)
Hemoglobin: 10.1 g/dL — ABNORMAL LOW (ref 12.0–15.0)
Immature Granulocytes: 0 %
Lymphocytes Relative: 29 %
Lymphs Abs: 1.9 10*3/uL (ref 0.7–4.0)
MCH: 28.8 pg (ref 26.0–34.0)
MCHC: 32.4 g/dL (ref 30.0–36.0)
MCV: 88.9 fL (ref 80.0–100.0)
Monocytes Absolute: 0.5 10*3/uL (ref 0.1–1.0)
Monocytes Relative: 8 %
Neutro Abs: 4 10*3/uL (ref 1.7–7.7)
Neutrophils Relative %: 60 %
Platelets: 276 10*3/uL (ref 150–400)
RBC: 3.51 MIL/uL — ABNORMAL LOW (ref 3.87–5.11)
RDW: 14.2 % (ref 11.5–15.5)
WBC: 6.6 10*3/uL (ref 4.0–10.5)
nRBC: 0 % (ref 0.0–0.2)

## 2023-09-15 LAB — COMPREHENSIVE METABOLIC PANEL
ALT: 18 U/L (ref 0–44)
AST: 28 U/L (ref 15–41)
Albumin: 4.1 g/dL (ref 3.5–5.0)
Alkaline Phosphatase: 67 U/L (ref 38–126)
Anion gap: 8 (ref 5–15)
BUN: 12 mg/dL (ref 6–20)
CO2: 22 mmol/L (ref 22–32)
Calcium: 8.9 mg/dL (ref 8.9–10.3)
Chloride: 107 mmol/L (ref 98–111)
Creatinine, Ser: 0.87 mg/dL (ref 0.44–1.00)
GFR, Estimated: >60 mL/min (ref >60)
Glucose, Bld: 119 mg/dL — ABNORMAL HIGH (ref 70–99)
Potassium: 3.4 mmol/L — ABNORMAL LOW (ref 3.5–5.1)
Sodium: 137 mmol/L (ref 135–145)
Total Bilirubin: 0.4 mg/dL (ref 0.3–1.2)
Total Protein: 7.1 g/dL (ref 6.5–8.1)

## 2023-09-15 MED ORDER — TRASTUZUMAB-ANNS CHEMO 150 MG IV SOLR
6.0000 mg/kg | Freq: Once | INTRAVENOUS | Status: AC
  Administered 2023-09-15: 462 mg via INTRAVENOUS
  Filled 2023-09-15: qty 22

## 2023-09-15 MED ORDER — HEPARIN SOD (PORK) LOCK FLUSH 100 UNIT/ML IV SOLN
500.0000 [IU] | Freq: Once | INTRAVENOUS | Status: AC | PRN
  Administered 2023-09-15: 500 [IU]
  Filled 2023-09-15: qty 5

## 2023-09-15 MED ORDER — SODIUM CHLORIDE 0.9 % IV SOLN
420.0000 mg | Freq: Once | INTRAVENOUS | Status: AC
  Administered 2023-09-15: 420 mg via INTRAVENOUS
  Filled 2023-09-15: qty 14

## 2023-09-15 MED ORDER — SODIUM CHLORIDE 0.9 % IV SOLN
Freq: Once | INTRAVENOUS | Status: AC
  Filled 2023-09-15: qty 250

## 2023-09-15 NOTE — Assessment & Plan Note (Signed)
Chronic tachycardia Stable low-normal LVEF 50-55%. Follow up with cardiology. She gets ECHO Q3 months. 

## 2023-09-15 NOTE — Assessment & Plan Note (Signed)
Follow up with  rheumatology.  Patient is on methotrexate,  plaquenil.  

## 2023-09-15 NOTE — Patient Instructions (Signed)
[facility]  Discharge Instructions: Thank you for choosing [facility] to provide your oncology and hematology care.  If you have a lab appointment with the Cancer Center, please go directly to the Cancer Center and check in at the registration area.  Wear comfortable clothing and clothing appropriate for easy access to any Portacath or PICC line.   We strive to give you quality time with your provider. You may need to reschedule your appointment if you arrive late (15 or more minutes).  Arriving late affects you and other patients whose appointments are after yours.  Also, if you miss three or more appointments without notifying the office, you may be dismissed from the clinic at the provider's discretion.      For prescription refill requests, have your pharmacy contact our office and allow 72 hours for refills to be completed.    Today you received the following chemotherapy and/or immunotherapy agents KANJINTI and PERJETA      To help prevent nausea and vomiting after your treatment, we encourage you to take your nausea medication as directed.  BELOW ARE SYMPTOMS THAT SHOULD BE REPORTED IMMEDIATELY: *FEVER GREATER THAN 100.4 F (38 C) OR HIGHER *CHILLS OR SWEATING *NAUSEA AND VOMITING THAT IS NOT CONTROLLED WITH YOUR NAUSEA MEDICATION *UNUSUAL SHORTNESS OF BREATH *UNUSUAL BRUISING OR BLEEDING *URINARY PROBLEMS (pain or burning when urinating, or frequent urination) *BOWEL PROBLEMS (unusual diarrhea, constipation, pain near the anus) TENDERNESS IN MOUTH AND THROAT WITH OR WITHOUT PRESENCE OF ULCERS (sore throat, sores in mouth, or a toothache) UNUSUAL RASH, SWELLING OR PAIN  UNUSUAL VAGINAL DISCHARGE OR ITCHING   Items with * indicate a potential emergency and should be followed up as soon as possible or go to the Emergency Department if any problems should occur.  Please show the CHEMOTHERAPY ALERT CARD or IMMUNOTHERAPY ALERT CARD at  check-in to the Emergency Department and triage nurse.  Should you have questions after your visit or need to cancel or reschedule your appointment, please contact [facility]  [phone] and follow the prompts.  Office hours are 8:00 a.m. to 4:30 p.m. Monday - Friday. Please note that voicemails left after 4:00 p.m. may not be returned until the following business day.  We are closed weekends and major holidays. You have access to a nurse at all times for urgent questions. Please call the main number to the clinic [phone] and follow the prompts.  For any non-urgent questions, you may also contact your provider using MyChart. We now offer e-Visits for anyone 60 and older to request care online for non-urgent symptoms. For details visit [email or web address].   Also download the MyChart app! Go to the app store, search "MyChart", open the app, select , and log in with your MyChart username and password.   Trastuzumab Injection What is this medication? TRASTUZUMAB (tras TOO zoo mab) treats breast cancer and stomach cancer. It works by blocking a protein that causes cancer cells to grow and multiply. This helps to slow or stop the spread of cancer cells. This medicine may be used for other purposes; ask your health care provider or pharmacist if you have questions. COMMON BRAND NAME(S): Herceptin, Marlowe Alt, Ontruzant, Trazimera What should I tell my care team before I take this medication? They need to know if you have any of these conditions: Heart failure Lung disease An unusual or allergic reaction to trastuzumab, other medications, foods, dyes, or preservatives Pregnant  or trying to get pregnant Breast-feeding How should I use this medication? This medication is injected into a vein. It is given by your care team in a hospital or clinic setting. Talk to your care team about the use of this medication in children. It is not  approved for use in children. Overdosage: If you think you have taken too much of this medicine contact a poison control center or emergency room at once. NOTE: This medicine is only for you. Do not share this medicine with others. What if I miss a dose? Keep appointments for follow-up doses. It is important not to miss your dose. Call your care team if you are unable to keep an appointment. What may interact with this medication? Certain types of chemotherapy, such as daunorubicin, doxorubicin, epirubicin, idarubicin This list may not describe all possible interactions. Give your health care provider a list of all the medicines, herbs, non-prescription drugs, or dietary supplements you use. Also tell them if you smoke, drink alcohol, or use illegal drugs. Some items may interact with your medicine. What should I watch for while using this medication? Your condition will be monitored carefully while you are receiving this medication. This medication may make you feel generally unwell. This is not uncommon, as chemotherapy affects healthy cells as well as cancer cells. Report any side effects. Continue your course of treatment even though you feel ill unless your care team tells you to stop. This medication may increase your risk of getting an infection. Call your care team for advice if you get a fever, chills, sore throat, or other symptoms of a cold or flu. Do not treat yourself. Try to avoid being around people who are sick. Avoid taking medications that contain aspirin, acetaminophen, ibuprofen, naproxen, or ketoprofen unless instructed by your care team. These medications can hide a fever. Talk to your care team if you may be pregnant. Serious birth defects can occur if you take this medication during pregnancy and for 7 months after the last dose. You will need a negative pregnancy test before starting this medication. Contraception is recommended while taking this medication and for 7 months  after the last dose. Your care team can help you find the option that works for you. Do not breastfeed while taking this medication and for 7 months after stopping treatment. What side effects may I notice from receiving this medication? Side effects that you should report to your care team as soon as possible: Allergic reactions or angioedema--skin rash, itching or hives, swelling of the face, eyes, lips, tongue, arms, or legs, trouble swallowing or breathing Dry cough, shortness of breath or trouble breathing Heart failure--shortness of breath, swelling of the ankles, feet, or hands, sudden weight gain, unusual weakness or fatigue Infection--fever, chills, cough, or sore throat Infusion reactions--chest pain, shortness of breath or trouble breathing, feeling faint or lightheaded Side effects that usually do not require medical attention (report to your care team if they continue or are bothersome): Diarrhea Dizziness Headache Nausea Trouble sleeping Vomiting This list may not describe all possible side effects. Call your doctor for medical advice about side effects. You may report side effects to FDA at 1-800-FDA-1088. Where should I keep my medication? This medication is given in a hospital or clinic. It will not be stored at home. NOTE: This sheet is a summary. It may not cover all possible information. If you have questions about this medicine, talk to your doctor, pharmacist, or health care provider.  2024 Elsevier/Gold Standard (  2022-04-12 00:00:00)  Pertuzumab Injection What is this medication? PERTUZUMAB (per TOOZ ue mab) treats breast cancer. It works by blocking a protein that causes cancer cells to grow and multiply. This helps to slow or stop the spread of cancer cells. It is a monoclonal antibody. This medicine may be used for other purposes; ask your health care provider or pharmacist if you have questions. COMMON BRAND NAME(S): PERJETA What should I tell my care team  before I take this medication? They need to know if you have any of these conditions: Heart failure An unusual or allergic reaction to pertuzumab, other medications, foods, dyes, or preservatives Pregnant or trying to get pregnant Breast-feeding How should I use this medication? This medication is injected into a vein. It is given by your care team in a hospital or clinic setting. Talk to your care team about the use of this medication in children. Special care may be needed. Overdosage: If you think you have taken too much of this medicine contact a poison control center or emergency room at once. NOTE: This medicine is only for you. Do not share this medicine with others. What if I miss a dose? Keep appointments for follow-up doses. It is important not to miss your dose. Call your care team if you are unable to keep an appointment. What may interact with this medication? Interactions are not expected. This list may not describe all possible interactions. Give your health care provider a list of all the medicines, herbs, non-prescription drugs, or dietary supplements you use. Also tell them if you smoke, drink alcohol, or use illegal drugs. Some items may interact with your medicine. What should I watch for while using this medication? Your condition will be monitored carefully while you are receiving this medication. This medication may make you feel generally unwell. This is not uncommon as chemotherapy can affect healthy cells as well as cancer cells. Report any side effects. Continue your course of treatment even though you feel ill unless your care team tells you to stop. Talk to your care team if you may be pregnant. Serious birth defects can occur if you take this medication during pregnancy and for 7 months after the last dose. You will need a negative pregnancy test before starting this medication. Contraception is recommended while taking this medication and for 7 months after the last  dose. Your care team can help you find the option that works for you. Do not breastfeed while taking this medication and for 7 months after the last dose. What side effects may I notice from receiving this medication? Side effects that you should report to your care team as soon as possible: Allergic reactions or angioedema--skin rash, itching or hives, swelling of the face, eyes, lips, tongue, arms, or legs, trouble swallowing or breathing Heart failure--shortness of breath, swelling of the ankles, feet, or hands, sudden weight gain, unusual weakness or fatigue Infusion reactions--chest pain, shortness of breath or trouble breathing, feeling faint or lightheaded Side effects that usually do not require medical attention (report to your care team if they continue or are bothersome): Diarrhea Dry skin Fatigue Hair loss Nausea Vomiting This list may not describe all possible side effects. Call your doctor for medical advice about side effects. You may report side effects to FDA at 1-800-FDA-1088. Where should I keep my medication? This medication is given in a hospital or clinic. It will not be stored at home. NOTE: This sheet is a summary. It may not cover all possible  information. If you have questions about this medicine, talk to your doctor, pharmacist, or health care provider.  2024 Elsevier/Gold Standard (2022-04-12 00:00:00)

## 2023-09-15 NOTE — Assessment & Plan Note (Addendum)
status post radiation. Continue Zometa monthly, hold if Calcium is <8.6  - hold due to recent bone fracture/surgery - Continue calcium supplementation. Continue oxycodone as needed.

## 2023-09-15 NOTE — Assessment & Plan Note (Signed)
She does not tolerate oral potassium.  Potassium is stable. 

## 2023-09-15 NOTE — Progress Notes (Signed)
Hematology/Oncology Progress note [phone] [phone]    REASON FOR VISIT Follow up for  treatment of breast cancer   ASSESSMENT & PLAN:   Cancer Staging  Breast cancer of upper-outer quadrant of right female breast (HCC) Staging form: Breast, AJCC 8th Edition - Clinical: G2, ER+, PR+, HER2- - Signed by [name], MD on 04/05/2018 - Pathologic stage from 04/04/2018: No Stage Recommended (ypT3, pN2, cM0, G3, ER+, PR-, HER2-) - Signed by [name], MD on 04/04/2018 - Pathologic: Stage IV (rpTX, pNX, cM1, ER+, PR-, HER2+) - Signed by [name], MD on 12/10/2020   Breast cancer of upper-outer quadrant of right female breast (HCC) Recurrent breast cancer with isolated bone metastasis, stage IV #Initially stage IIIA right breast cancer, ER positive, HER-2 negative status post bilateral oophorectomy,Right mastectomy and right axillary lymph node dissection, status post implant, and implant removal.  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 ER positive, HER-2 positive  .- s/p Palliative radiation to T7  Labs reviewed and discussed Proceed with Kanjinti Perjecta.   Continue fulvestrant monthly.  Malignant neoplasm metastatic to bone [facility]) status post radiation. Continue Zometa monthly, hold if Calcium is <8.6  - hold due to recent bone fracture/surgery - Continue calcium supplementation. Continue oxycodone as needed.    Nonischemic cardiomyopathy (HCC) Chronic tachycardia Stable low-normal LVEF 50-55%. Follow up with cardiology. She gets ECHO Q3 months.  Neuropathy due to chemotherapeutic drug (HCC) continue Lyrica and nortriptyline symptoms stable  Rheumatoid arthritis (HCC) Follow up with  rheumatology.  Patient is on methotrexate,  plaquenil.    Encounter for monoclonal antibody treatment for malignancy Antibody treatment plan as list above.  Hypocalcemia Continue calcium  supplementation 600mg  TID.    Hypokalemia She does not tolerate oral potassium.  Potassium is stable.  Patient declines influenza vaccination.   Orders Placed This Encounter  Procedures   NM PET Image Restag (PS) Skull Base To Thigh    Standing Status:   Future    Standing Expiration Date:   09/14/2024    Order Specific Question:   If indicated for the ordered procedure, I authorize the administration of a radiopharmaceutical per Radiology protocol    Answer:   Yes    Order Specific Question:   Is the patient pregnant?    Answer:   No    Order Specific Question:   Preferred imaging location?    Answer:   [facility]   Comprehensive metabolic panel    Standing Status:   Future    Standing Expiration Date:   10/26/2024   CBC with Differential    Standing Status:   Future    Standing Expiration Date:   10/26/2024   Comprehensive metabolic panel    Standing Status:   Future    Standing Expiration Date:   11/16/2024   CBC with Differential    Standing Status:   Future    Standing Expiration Date:   11/16/2024   Comprehensive metabolic panel    Standing Status:   Future    Standing Expiration Date:   12/14/2024   CBC with Differential    Standing Status:   Future    Standing Expiration Date:   12/14/2024   Follow up  lab Kanjinti Perjecta 3 weeks.  lab MD Kanjinti Perjecta 6 weeks.  fulvestrant Q 4 weeks   All questions were answered. The patient knows to call the clinic with any problems, questions or concerns.  Janyth Contes  Cathie Hoops, MD, PhD [facility] Hematology Oncology 09/15/2023       Oncology History  Oncology History Overview Note        Malignant neoplasm of overlapping sites of right breast in female, estrogen receptor positive (HCC)  Breast cancer of upper-outer quadrant of right female breast (HCC)  10/19/2017 Initial Diagnosis   Malignant neoplasm of overlapping sites of right breast in female, estrogen receptor positive (HCC)   11/08/2017 - 01/03/2018  Chemotherapy   Neoadjuvant ddAC x 4 + 1 cycle of Taxol  due to lack of response, surgery was offered.   03/19/2018 Surgery   S/p right mastectomy and right axillary dissection, immediate breast reconstruction with placement of expanders.  ypT3 ypN2, + lymphovascular invasion,  Grade 3, margin is negative, close. ER 90%, PR 0%, HER2 IHC negative.   # elective bilateral salpingo-oophorectomy..    06/11/2018 Imaging   s/p 11 cycles Taxol adjuvantly   10/10/2018 -  Radiation Therapy   adjuvant right chest wall radiation   06/03/2019 Surgery   underwent elective left prophylactic mastectomy and sentinel lymph node biopsy of left axilla Pathology negative for malignancy  # Mediport removal  # right implant removal on    07/07/2020 Imaging   MRI thoracic spine without contrast showed lesions involving the T7 posterior elements most concerning for metastatic lesion.  No evidence of epidural tumor.  Minimal thoracic spondylosis without stenosis. MRI was reviewed by me and a PET scan was obtained for further evaluation   07/20/2020 Imaging   PET scan showed hypermetabolic metastasis involving the posterior element of T7, no additional evidence of metastasis in the neck, chest, abdomen or pelvis.   07/29/2020 Procedure   T7 lesion biopsy showed metastatic carcinoma, compatible with breast origin.  Receptor status staining showed ER 71-80% positive, PR negative, HER-2 positive IHC 3+   08/31/2020 -  Radiation Therapy    finished spine radiation    09/17/2020 -  Chemotherapy   Patient is on Treatment Plan :  BREAST Trastuzumab + Pertuzumab q21d     12/10/2020 Cancer Staging   Staging form: Breast, AJCC 8th Edition - Pathologic: Stage IV (rpTX, pNX, cM1, ER+, PR-, HER2+) - Signed by [name], MD on 12/10/2020   03/15/2021 Imaging    PET scan showed no focal hypermetabolic activity to suggest skeletal metastasis.  Mild hypermetabolic activity along the right T7-8 paraspinal musculature.  Max SUV 3.3.   Likely postprocedural-   09/28/2021 Echocardiogram   further decrease of LVEF to 45%   10/14/2021 Imaging   PET  Post bilateral mastectomy and RIGHT axillary dissection without signs of recurrent or metastatic disease.   11/12/2021 Echocardiogram   LVEF of 45-50%.   02/18/2022 Imaging   MRI thoracic and lumbar spine w wo contrast  1. Negative MRIs of the thoracic and lumbar spine. No evidence for locally recurrent metastasis at the level of T7. No other new metastatic disease elsewhere with thoracolumbar spine. 2. No significant disc pathology, stenosis, or evidence for neural impingement.    02/25/2022 Echocardiogram   LVEF 55-60%.   03/16/2022 Imaging   PET showed Stable PET-CT. No findings for local recurrent breast cancer,locoregional adenopathy or distant metastatic disease.    07/20/2022 Imaging   MRI Thoracic Spine w wo contrast 1. Interval resolution of the increased T2 signal contrast enhancement in the posterior elements of T7. No new lesion identified. 2. No spinal canal or neural foraminal stenosis.   08/23/2022 Echocardiogram   Stable low-normal LVEF at 50-55%.  10/05/2022 Imaging   PET restaging Stable examination without evidence of hypermetabolic recurrence or metastatic disease.   Persistent minimally metabolic stranding in the posterior bilateral gluteal soft tissues may reflect sequela of subcutaneous injections   02/24/2023 Echocardiogram   Echocardiogram showed stable low normal LVEF 50-55%.   04/07/2023 Imaging   PET scan  1. Status post bilateral mastectomies without evidence of hypermetabolic local recurrence. 2. No evidence of hypermetabolic metastatic disease. 3. Asymmetric right-sided tonsillar FDG avidity is nonspecific consider further evaluation with direct visualization. 4. Gas fluid levels in the proximal colon may reflect a diarrheal illness or laxative use.      INTERVAL HISTORY 42 yo female with above oncology history reviewed by me  presents for follow-up of management of metastatic Triple positive breast cancer.  Chronic back pain, unchanged.  Neck/upper back pain for 1 week Intermittent diarrhea, manageable.  No fever or chills. + arthralgia + right ankle fracture, s/p surgery    Review of Systems  Constitutional:  Negative for chills, fever, malaise/fatigue and weight loss.  HENT:  Negative for sore throat.        Hair loss  Eyes:  Negative for redness.  Respiratory:  Negative for cough, shortness of breath and wheezing.   Cardiovascular:  Negative for chest pain, palpitations and leg swelling.  Gastrointestinal:  Negative for abdominal pain, blood in stool, heartburn, nausea and vomiting.  Genitourinary:  Negative for dysuria.  Musculoskeletal:  Positive for back pain and joint pain.  Skin:  Positive for rash.  Neurological:  Positive for tingling. Negative for dizziness and tremors.  Endo/Heme/Allergies:  Does not bruise/bleed easily.  Psychiatric/Behavioral:  Negative for hallucinations. The patient does not have insomnia.     No Known Allergies  Patient Active Problem List   Diagnosis Date Noted   Breast cancer of upper-outer quadrant of right female breast (HCC) 03/19/2018    Priority: High   Hypokalemia 06/03/2022    Priority: Medium    Malignant neoplasm metastatic to bone (HCC) 03/25/2021    Priority: Medium    Encounter for monoclonal antibody treatment for malignancy 12/10/2020    Priority: Medium    Hypocalcemia 11/19/2020    Priority: Medium    Neuropathy due to chemotherapeutic drug (HCC) 08/11/2020    Priority: Medium    Rheumatoid arthritis (HCC) 10/13/2019    Priority: Medium    Nonischemic cardiomyopathy (HCC) 08/23/2018    Priority: Medium    Folliculitis and perifolliculitis 04/14/2023    Priority: Low   Abnormal bowel movement 04/14/2023    Priority: Low   Right shoulder pain 01/20/2023    Priority: Low   Thyroid nodule 06/04/2022    Priority: Low   Encounter for  antineoplastic chemotherapy 10/29/2020    Priority: Low   Goals of care, counseling/discussion 08/11/2020    Priority: Low   Gastroesophageal reflux disease without esophagitis 02/24/2017    Priority: Low   Malignant tumor of breast (HCC) 07/11/2023   Stress fracture of metatarsal bone of right foot 07/11/2023   Anemia 06/23/2023   Change in bowel habits 06/23/2023   Closed fracture of fifth metatarsal bone 05/09/2023   Tachycardia, unspecified 04/15/2021   Bone lesion 11/19/2020   Inflammatory arthritis 11/19/2020   HER2-positive carcinoma of breast (HCC) 08/11/2020   Bilateral hand swelling 10/03/2019   Fracture of neck of metacarpal bone 05/14/2019   Chronic fatigue 04/09/2019   Polyarthralgia 04/09/2019   Status post right breast reconstruction 02/26/2019   Status post right mastectomy 02/26/2019   Mastalgia  02/15/2019   Shortness of breath 08/23/2018   Preprocedural cardiovascular examination 08/23/2018   Tachycardia 08/23/2018   Palpitations 08/23/2018   Estrogen receptor positive status (ER+) 04/04/2018   Acquired absence of right breast and nipple 04/03/2018   Family history of cancer    Malignant neoplasm of overlapping sites of right breast in female, estrogen receptor positive (HCC) 10/19/2017   Generalized anxiety disorder 10/03/2014   Headache 10/03/2014     Past Medical History:  Diagnosis Date   Anemia    Arthritis    BRCA negative 11/26/2017   Breast cancer (HCC) 10/11/2017   Multifocal, ER positive, PR negative, HER-2 negative. ypT3 ypN2a 8.7 cm, 4/15 nodes   Cardiomyopathy (HCC)    a. 10/2017 Echo: EF 60-65%, no rwma, Gr1 DD, nl RV size/fxn; b. 04/2018 Echo: EF 55-60%, no rwma, Nl RV size/fxn; c. 08/2018 Echo: EF 45%, diff HK, ? HK of antsept wall. Gr1 DD. Mild MR. Mild LAE/RAE. Mod dil RV.    Chronic bronchitis (HCC) 11/2017   COPD (chronic obstructive pulmonary disease) (HCC)    MILD PER CXR   Depression    Family history of cancer    GERD  (gastroesophageal reflux disease)    Headache    MIGRAINES   Heart murmur    ASYMPTOMATIC   Personal history of chemotherapy    current for right breast ca     Past Surgical History:  Procedure Laterality Date   AXILLARY LYMPH NODE DISSECTION Right 03/19/2018   Procedure: AXILLARY LYMPH NODE DISSECTION;  Surgeon: Earline Mayotte, MD;  Location: [facility] ORS;  Service: General;  Laterality: Right;   BIOPSY  06/23/2023   Procedure: BIOPSY;  Surgeon: Wyline Mood, MD;  Location: [facility] ENDOSCOPY;  Service: Gastroenterology;;   BREAST BIOPSY Right 10/11/2017   12:30 posterior coil clip invasive mammary carcinoma   BREAST BIOPSY Right 10/11/2017   11:30 middle depth ribbon clip DCIS   BREAST BIOPSY Right 10/11/2017   5:30 anterior depth x shape invasive ductal carcinoma   BREAST IMPLANT REMOVAL Right 06/03/2019   Procedure: REMOVAL OF RIGHT BREAST IMPLANTS;  Surgeon: Peggye Form, DO;  Location: [facility] ORS;  Service: Plastics;  Laterality: Right;   BREAST RECONSTRUCTION WITH PLACEMENT OF TISSUE EXPANDER AND FLEX HD (ACELLULAR HYDRATED DERMIS) Right 03/19/2018   Procedure: BREAST RECONSTRUCTION WITH PLACEMENT OF TISSUE EXPANDER AND FLEX HD (ACELLULAR HYDRATED DERMIS);  Surgeon: Peggye Form, DO;  Location: [facility];  Service: Plastics;  Laterality: Right;   CARPAL TUNNEL RELEASE Bilateral 2020   CHOLECYSTECTOMY N/A 04/27/2020   Procedure: LAPAROSCOPIC CHOLECYSTECTOMY WITH INTRAOPERATIVE CHOLANGIOGRAM;  Surgeon: Earline Mayotte, MD;  Location: [facility];  Service: General;  Laterality: N/A;   COLONOSCOPY WITH PROPOFOL N/A 06/23/2023   Procedure: COLONOSCOPY WITH PROPOFOL;  Surgeon: Wyline Mood, MD;  Location: [facility] ENDOSCOPY;  Service: Gastroenterology;  Laterality: N/A;   ESOPHAGOGASTRODUODENOSCOPY (EGD) WITH PROPOFOL N/A 04/17/2020   Procedure: ESOPHAGOGASTRODUODENOSCOPY (EGD) WITH PROPOFOL;  Surgeon: Earline Mayotte, MD;  Location: [facility] ENDOSCOPY;  Service: Endoscopy;  Laterality:  N/A;  with biopsy   ESOPHAGOGASTRODUODENOSCOPY (EGD) WITH PROPOFOL N/A 06/23/2023   Procedure: ESOPHAGOGASTRODUODENOSCOPY (EGD) WITH PROPOFOL;  Surgeon: Wyline Mood, MD;  Location[facility] ENDOSCOPY;  Service: Gastroenterology;  Laterality: N/A;   LAPAROSCOPIC BILATERAL SALPINGO OOPHERECTOMY Bilateral 03/19/2018   Procedure: LAPAROSCOPIC BILATERAL SALPINGO OOPHORECTOMY;  Surgeon: Christeen Douglas, MD;  Location: [facility] ORS;  Service: Gynecology;  Laterality: Bilateral;   MASTECTOMY Right 03/2018   MASTECTOMY W/ SENTINEL NODE BIOPSY Right 03/19/2018   Procedure:  MASTECTOMY WITH SENTINEL LYMPH NODE BIOPSY;  Surgeon: Earline Mayotte, MD;  Location: [facility];  Service: General;  Laterality: Right;   PORT-A-CATH REMOVAL Left 06/03/2019   Procedure: REMOVAL PORT-A-CATH;  Surgeon: Earline Mayotte, MD;  Location: [facility];  Service: General;  Laterality: Left;   PORTACATH PLACEMENT Left 10/24/2017   Procedure: INSERTION PORT-A-CATH;  Surgeon: Earline Mayotte, MD;  Location: [facility];  Service: General;  Laterality: Left;   PORTACATH PLACEMENT Right 09/28/2020   Procedure: INSERTION PORT-A-CATH;  Surgeon: Earline Mayotte, MD;  Location: [facility];  Service: General;  Laterality: Right;   REMOVAL OF TISSUE EXPANDER AND PLACEMENT OF IMPLANT Right 07/20/2018   Procedure: REMOVAL OF RIGHT BREAST TISSUE EXPANDER AND PLACEMENT OF IMPLANT;  Surgeon: Peggye Form, DO;  Location: [facility];  Service: Plastics;  Laterality: Right;   SIMPLE MASTECTOMY WITH AXILLARY SENTINEL NODE BIOPSY Left 06/03/2019   Procedure: SIMPLE MASTECTOMY LEFT;  Surgeon: Earline Mayotte, MD;  Location: [facility]S;  Service: General;  Laterality: Left;    Social History   Socioeconomic History   Marital status: Married    Spouse name: Not on file   Number of children: Not on file   Years of education: Not on file   Highest education level: Not on file  Occupational History   Occupation: pharmacy tech     Comment: Acupuncturist [employer]   Tobacco Use   Smoking status: Former    Current packs/day: 0.00    Average packs/day: 0.5 packs/day for 18.0 years (9.0 ttl pk-yrs)    Types: Cigarettes    Start date: 06/21/2018    Quit date: 12/11/2022    Years since quitting: 0.7   Smokeless tobacco: Never  Vaping Use   Vaping status: Never Used  Substance and Sexual Activity   Alcohol use: No   Drug use: No   Sexual activity: Yes    Birth control/protection: Injection, Other-see comments    Comment: has had hysterectomy  Other Topics Concern   Not on file  Social History Narrative   Lives at home with husband and daughter   Social Determinants of Health   Financial Resource Strain: Not on file  Food Insecurity: Not on file  Transportation Needs: Not on file  Physical Activity: Not on file  Stress: Not on file  Social Connections: Not on file  Intimate Partner Violence: Not on file     Family History  Problem Relation Age of Onset   Diabetes Father    Hypertension Father    Hyperlipidemia Father    Heart attack Father 91       "mild"   Melanoma Maternal Aunt        other aunts with BCC/SCC/Melanoma   Cervical cancer Maternal Aunt 53       daughter w/ cervical cancer as well   Lung cancer Maternal Aunt    Melanoma Maternal Uncle        other uncles with BCC/SCC/Melanoma   Breast cancer Paternal Aunt    Bladder Cancer Maternal Grandmother   Biological mother had Grave's disease.    Current Outpatient Medications:    acetaminophen (TYLENOL) 500 MG tablet, Take 500 mg by mouth every 6 (six) hours as needed., Disp: , Rfl:    albuterol (VENTOLIN HFA) 108 (90 Base) MCG/ACT inhaler, Inhale 2 puffs into the lungs every 6 (six) hours as needed for wheezing or shortness of breath. , Disp: , Rfl:    CALCIUM CARBONATE-VITAMIN  D PO, Take 600-800 mg by mouth in the morning, at noon, and at bedtime., Disp: , Rfl:    chlorhexidine (PERIDEX) 0.12 % solution, Use as directed 15  mLs in the mouth or throat 2 (two) times daily., Disp: 120 mL, Rfl: 0   cyanocobalamin (,VITAMIN B-12,) 1000 MCG/ML injection, Inject 1,000 mcg into the skin every 30 (thirty) days., Disp: , Rfl:    diphenoxylate-atropine (LOMOTIL) 2.5-0.025 MG tablet, Take 1 tablet by mouth 4 (four) times daily as needed for diarrhea or loose stools., Disp: 60 tablet, Rfl: 0   escitalopram (LEXAPRO) 20 MG tablet, Take 20 mg by mouth daily., Disp: , Rfl:    esomeprazole (NEXIUM) 40 MG capsule, Take 40 mg by mouth daily before breakfast. , Disp: , Rfl:    Eszopiclone 3 MG TABS, Take 3 mg by mouth at bedtime as needed (sleep). , Disp: , Rfl:    folic acid (FOLVITE) 1 MG tablet, Take 1 mg by mouth daily., Disp: , Rfl:    hydrocortisone 2.5 % cream, , Disp: , Rfl:    hydroxychloroquine (PLAQUENIL) 200 MG tablet, Take 200 mg by mouth daily., Disp: , Rfl:    hydrOXYzine (ATARAX) 10 MG tablet, Take 1 tablet (10 mg total) by mouth every 6 (six) hours as needed., Disp: 60 tablet, Rfl: 5   ibuprofen (ADVIL) 800 MG tablet, Take 800 mg by mouth every 8 (eight) hours as needed for moderate pain., Disp: , Rfl:    loperamide (IMODIUM) 2 MG capsule, Take 1 tablet (2 mg total) by mouth See admin instructions. Take 2 tablets with onset of diarrhea, then take 1 tablet every 2 hours until diarrhea stops. Maximum 8 tablets in 24, Disp: 90 capsule, Rfl: 0   loratadine (CLARITIN) 10 MG tablet, Take 10 mg by mouth daily. , Disp: , Rfl:    LORazepam (ATIVAN) 1 MG tablet, Take 1 mg by mouth 3 (three) times daily., Disp: , Rfl:    Magnesium 500 MG TABS, Take 500 mg by mouth 2 (two) times daily., Disp: , Rfl:    methocarbamol (ROBAXIN) 500 MG tablet, Take by mouth., Disp: , Rfl:    methotrexate (RHEUMATREX) 2.5 MG tablet, Take 25 mg by mouth every Sunday. 10 tablets once a week, Disp: , Rfl:    metoprolol tartrate (LOPRESSOR) 25 MG tablet, Take 0.5 tablets (12.5 mg total) by mouth as needed., Disp: 30 tablet, Rfl: 0   mupirocin ointment  (BACTROBAN) 2 %, Place 1 application into the nose 2 (two) times daily. Use in each nostril twice daily for five (5) days., Disp: 22 g, Rfl: 5   nortriptyline (PAMELOR) 10 MG capsule, Take 30 mg by mouth at bedtime. , Disp: , Rfl:    oxyCODONE (OXY IR/ROXICODONE) 5 MG immediate release tablet, Take 1 tablet (5 mg total) by mouth every 6 (six) hours as needed for moderate pain or severe pain., Disp: 90 tablet, Rfl: 0   phentermine (ADIPEX-P) 37.5 MG tablet, Take 37.5 mg by mouth daily before breakfast. , Disp: , Rfl:    pregabalin (LYRICA) 150 MG capsule, Take 150 mg by mouth 2 (two) times daily., Disp: , Rfl:    promethazine (PHENERGAN) 25 MG tablet, Take 1 tablet (25 mg total) by mouth every 8 (eight) hours as needed for nausea or vomiting., Disp: 90 tablet, Rfl: 0   pyridOXINE (VITAMIN B-6) 100 MG tablet, Take 100 mg by mouth daily., Disp: , Rfl:    topiramate (TOPAMAX) 50 MG tablet, Take 50 mg by mouth  2 (two) times daily. , Disp: , Rfl:    vitamin C (ASCORBIC ACID) 500 MG tablet, Take 500 mg by mouth daily., Disp: , Rfl:  No current facility-administered medications for this visit.  Facility-Administered Medications Ordered in Other Visits:    heparin lock flush 100 unit/mL, 500 Units, Intravenous, Once, [name], MD   sodium chloride flush (NS) 0.9 % injection 10 mL, 10 mL, Intravenous, PRN, [name], MD, 10 mL at 11/19/18 1249   sodium chloride flush (NS) 0.9 % injection 10 mL, 10 mL, Intravenous, Once, [name], MD   Physical exam:  Vitals:   09/15/23 0859  BP: 115/76  Pulse: (!) 102  Resp: 18  Temp: (!) 96 F (35.6 C)  TempSrc: Tympanic  SpO2: 99%  Weight: 180 lb 1.6 oz (81.7 kg)  ECOG 1 Physical Exam Constitutional:      Appearance: Normal appearance.  HENT:     Head: Normocephalic and atraumatic.  Eyes:     General: No scleral icterus. Neck:     Vascular: No JVD.  Cardiovascular:     Rate and Rhythm: Normal rate.  Pulmonary:     Effort: Pulmonary effort is normal.  No respiratory distress.     Breath sounds: Normal breath sounds.  Abdominal:     General: There is no distension.     Palpations: Abdomen is soft.  Musculoskeletal:     Cervical back: Normal range of motion.     Comments: Right lower extremity with dressing.   Lymphadenopathy:     Cervical: No cervical adenopathy.  Skin:    General: Skin is warm.     Findings: No erythema or rash.  Neurological:     Mental Status: She is alert and oriented to person, place, and time. Mental status is at baseline.     Cranial Nerves: No cranial nerve deficit.     Motor: No abnormal muscle tone.  Psychiatric:        Mood and Affect: Mood and affect normal.        Labs     Latest Ref Rng & Units 09/15/2023    8:43 AM  CMP  Glucose 70 - 99 mg/dL 782   BUN 6 - 20 mg/dL 12   Creatinine 9.56 - 1.00 mg/dL 2.13   Sodium 086 - 578 mmol/L 137   Potassium 3.5 - 5.1 mmol/L 3.4   Chloride 98 - 111 mmol/L 107   CO2 22 - 32 mmol/L 22   Calcium 8.9 - 10.3 mg/dL 8.9   Total Protein 6.5 - 8.1 g/dL 7.1   Total Bilirubin 0.3 - 1.2 mg/dL 0.4   Alkaline Phos 38 - 126 U/L 67   AST 15 - 41 U/L 28   ALT 0 - 44 U/L 18       Latest Ref Rng & Units 09/15/2023    8:43 AM  CBC  WBC 4.0 - 10.5 K/uL 6.6   Hemoglobin 12.0 - 15.0 g/dL 46.9   Hematocrit 62.9 - 46.0 % 31.2   Platelets 150 - 400 K/uL 276    RADIOGRAPHIC STUDIES: I have personally reviewed the radiological images as listed and agreed with the findings in the report. ECHOCARDIOGRAM LIMITED  Result Date: 08/31/2023    ECHOCARDIOGRAM LIMITED REPORT   Patient Name:   Theresa Chaney Date of Exam: 08/31/2023 Medical Rec #:  [id number]      Height:       67.0 in Accession #:    [id number]  Weight:       168.0 lb Date of Birth:  06/15/1981      BSA:          1.878 m Patient Age:    42 years       BP:           110/64 mmHg Patient Gender: F              HR:           96 bpm. Exam Location:  [place] Procedure: 2D Echo, Limited Echo, Intracardiac  Opacification Agent, Limited            Color Doppler and Color Doppler Indications:    I42.80 Non-ischemic cardiomyopathy  History:        Patient has prior history of Echocardiogram examinations, most                 recent 05/30/2023. Cardiomyopathy, COPD, Signs/Symptoms:Murmur                 and Shortness of Breath; Risk Factors:Former Smoker.  Sonographer:    Ilda Mori MHA, BS, RDCS Referring Phys: [phone] Sondra Barges  Sonographer Comments: Suboptimal apical window. Image acquisition challenging due to mastectomy and Image acquisition challenging due to COPD. IMPRESSIONS  1. Left ventricular ejection fraction, by estimation, is 45 to 50%. The left ventricle has mildly decreased function. The left ventricle demonstrates global hypokinesis. Left ventricular diastolic parameters are indeterminate.  2. Right ventricular systolic function is normal. The right ventricular size is normal. Tricuspid regurgitation signal is inadequate for assessing PA pressure.  3. The mitral valve is normal in structure. No evidence of mitral valve regurgitation. No evidence of mitral stenosis.  4. The aortic valve is normal in structure. Aortic valve regurgitation is not visualized. No aortic stenosis is present.  5. The inferior vena cava is normal in size with greater than 50% respiratory variability, suggesting right atrial pressure of 3 mmHg. FINDINGS  Left Ventricle: Left ventricular ejection fraction, by estimation, is 45 to 50%. The left ventricle has mildly decreased function. The left ventricle demonstrates global hypokinesis. The left ventricular internal cavity size was normal in size. There is  no left ventricular hypertrophy. Left ventricular diastolic parameters are indeterminate. Right Ventricle: The right ventricular size is normal. No increase in right ventricular wall thickness. Right ventricular systolic function is normal. Tricuspid regurgitation signal is inadequate for assessing PA pressure. Left Atrium:  Left atrial size was normal in size. Right Atrium: Right atrial size was normal in size. Pericardium: There is no evidence of pericardial effusion. Mitral Valve: The mitral valve is normal in structure. No evidence of mitral valve stenosis. Tricuspid Valve: The tricuspid valve is normal in structure. Tricuspid valve regurgitation is mild . No evidence of tricuspid stenosis. Aortic Valve: The aortic valve is normal in structure. Aortic valve regurgitation is not visualized. No aortic stenosis is present. Pulmonic Valve: The pulmonic valve was normal in structure. Pulmonic valve regurgitation is not visualized. No evidence of pulmonic stenosis. Aorta: The aortic root is normal in size and structure. Venous: The inferior vena cava is normal in size with greater than 50% respiratory variability, suggesting right atrial pressure of 3 mmHg. IAS/Shunts: No atrial level shunt detected by color flow Doppler. LEFT VENTRICLE PLAX 2D LVIDd:         4.50 cm      Diastology LVIDs:         3.20 cm      LV e'  medial:  14.30 cm/s LV PW:         0.70 cm      LV e' lateral: 19.00 cm/s LV IVS:        0.70 cm  LV Volumes (MOD) LV vol d, MOD A2C: 136.0 ml LV vol d, MOD A4C: 126.0 ml LV vol s, MOD A2C: 76.4 ml LV vol s, MOD A4C: 62.5 ml LV SV MOD A2C:     59.6 ml LV SV MOD A4C:     126.0 ml LV SV MOD BP:      62.4 ml Julien Nordmann MD Electronically signed by Julien Nordmann MD Signature Date/Time: 08/31/2023/6:28:16 PM    Final

## 2023-09-15 NOTE — Assessment & Plan Note (Signed)
Continue calcium supplementation 600mg  TID.

## 2023-09-15 NOTE — Assessment & Plan Note (Signed)
Recurrent breast cancer with isolated bone metastasis, stage IV #Initially stage IIIA right breast cancer, ER positive, HER-2 negative status post bilateral oophorectomy,Right mastectomy and right axillary lymph node dissection, status post implant, and implant removal.  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 ER positive, HER-2 positive  .- s/p Palliative radiation to T7  Labs reviewed and discussed Proceed with Kanjinti Perjecta.   Continue fulvestrant monthly.  

## 2023-09-15 NOTE — Assessment & Plan Note (Signed)
Antibody treatment plan as list above. 

## 2023-09-15 NOTE — Assessment & Plan Note (Signed)
continue Lyrica and nortriptyline symptoms stable 

## 2023-10-03 ENCOUNTER — Encounter: Payer: Self-pay | Admitting: Oncology

## 2023-10-06 ENCOUNTER — Inpatient Hospital Stay: Payer: Medicare HMO

## 2023-10-06 VITALS — BP 105/68 | HR 92 | Temp 97.8°F | Resp 18 | Wt 176.1 lb

## 2023-10-06 DIAGNOSIS — C50411 Malignant neoplasm of upper-outer quadrant of right female breast: Secondary | ICD-10-CM

## 2023-10-06 DIAGNOSIS — Z5112 Encounter for antineoplastic immunotherapy: Secondary | ICD-10-CM | POA: Diagnosis not present

## 2023-10-06 DIAGNOSIS — C50811 Malignant neoplasm of overlapping sites of right female breast: Secondary | ICD-10-CM

## 2023-10-06 LAB — COMPREHENSIVE METABOLIC PANEL
ALT: 18 U/L (ref 0–44)
AST: 25 U/L (ref 15–41)
Albumin: 4.3 g/dL (ref 3.5–5.0)
Alkaline Phosphatase: 67 U/L (ref 38–126)
Anion gap: 8 (ref 5–15)
BUN: 21 mg/dL — ABNORMAL HIGH (ref 6–20)
CO2: 21 mmol/L — ABNORMAL LOW (ref 22–32)
Calcium: 8.8 mg/dL — ABNORMAL LOW (ref 8.9–10.3)
Chloride: 108 mmol/L (ref 98–111)
Creatinine, Ser: 0.93 mg/dL (ref 0.44–1.00)
GFR, Estimated: >60 mL/min (ref >60)
Glucose, Bld: 85 mg/dL (ref 70–99)
Potassium: 3.3 mmol/L — ABNORMAL LOW (ref 3.5–5.1)
Sodium: 137 mmol/L (ref 135–145)
Total Bilirubin: 0.4 mg/dL (ref 0.3–1.2)
Total Protein: 7.4 g/dL (ref 6.5–8.1)

## 2023-10-06 LAB — CBC WITH DIFFERENTIAL/PLATELET
Abs Immature Granulocytes: 0.03 10*3/uL (ref 0.00–0.07)
Basophils Absolute: 0 10*3/uL (ref 0.0–0.1)
Basophils Relative: 1 %
Eosinophils Absolute: 0.1 10*3/uL (ref 0.0–0.5)
Eosinophils Relative: 2 %
HCT: 31.3 % — ABNORMAL LOW (ref 36.0–46.0)
Hemoglobin: 10.4 g/dL — ABNORMAL LOW (ref 12.0–15.0)
Immature Granulocytes: 1 %
Lymphocytes Relative: 31 %
Lymphs Abs: 1.8 10*3/uL (ref 0.7–4.0)
MCH: 29.1 pg (ref 26.0–34.0)
MCHC: 33.2 g/dL (ref 30.0–36.0)
MCV: 87.4 fL (ref 80.0–100.0)
Monocytes Absolute: 0.6 10*3/uL (ref 0.1–1.0)
Monocytes Relative: 10 %
Neutro Abs: 3.4 10*3/uL (ref 1.7–7.7)
Neutrophils Relative %: 55 %
Platelets: 288 10*3/uL (ref 150–400)
RBC: 3.58 MIL/uL — ABNORMAL LOW (ref 3.87–5.11)
RDW: 14.4 % (ref 11.5–15.5)
WBC: 6 10*3/uL (ref 4.0–10.5)
nRBC: 0 % (ref 0.0–0.2)

## 2023-10-06 MED ORDER — FULVESTRANT 250 MG/5ML IM SOSY
500.0000 mg | PREFILLED_SYRINGE | Freq: Once | INTRAMUSCULAR | Status: AC
Start: 2023-10-06 — End: 2023-10-06
  Administered 2023-10-06: 500 mg via INTRAMUSCULAR
  Filled 2023-10-06: qty 10

## 2023-10-06 MED ORDER — SODIUM CHLORIDE 0.9 % IV SOLN
Freq: Once | INTRAVENOUS | Status: AC
  Filled 2023-10-06: qty 250

## 2023-10-06 MED ORDER — TRASTUZUMAB-ANNS CHEMO 150 MG IV SOLR
6.0000 mg/kg | Freq: Once | INTRAVENOUS | Status: AC
  Administered 2023-10-06: 462 mg via INTRAVENOUS
  Filled 2023-10-06: qty 22

## 2023-10-06 MED ORDER — SODIUM CHLORIDE 0.9 % IV SOLN
420.0000 mg | Freq: Once | INTRAVENOUS | Status: AC
  Administered 2023-10-06: 420 mg via INTRAVENOUS
  Filled 2023-10-06: qty 14

## 2023-10-06 MED ORDER — FULVESTRANT 250 MG/5ML IM SOLN: 500.0000 mg | INTRAMUSCULAR | Status: DC

## 2023-10-06 MED ORDER — HEPARIN SOD (PORK) LOCK FLUSH 100 UNIT/ML IV SOLN
500.0000 [IU] | Freq: Once | INTRAVENOUS | Status: AC | PRN
  Administered 2023-10-06: 500 [IU]
  Filled 2023-10-06: qty 5

## 2023-10-06 NOTE — Progress Notes (Signed)
Last Echo on 08/31/23 showed LVEF of 45-50% per Dr. Cathie Hoops okay to proceed with treatment as scheduled (kanjinti/Perjeta/Faslodex).

## 2023-10-06 NOTE — Progress Notes (Signed)
[facility] Clinical Social Work  Clinical Social Work was referred by nurse for assessment of psychosocial needs.  Clinical Social Worker met with patient to offer support and assess for needs.    Discussed the upcoming [employer] event and she expressed interest.  Provided her with the [employer] flyer regarding assistance with treatment payment.  CSW provided active listening and supportive counseling.     Dorothey Baseman, LCSW  Clinical Social Worker [facility]

## 2023-10-06 NOTE — Patient Instructions (Signed)
[facility] [place] [facility]  Discharge Instructions: Thank you for choosing [facility] to provide your oncology and hematology care.  If you have a lab appointment with the Cancer Center, please go directly to the Cancer Center and check in at the registration area.  Wear comfortable clothing and clothing appropriate for easy access to any Portacath or PICC line.   We strive to give you quality time with your provider. You may need to reschedule your appointment if you arrive late (15 or more minutes).  Arriving late affects you and other patients whose appointments are after yours.  Also, if you miss three or more appointments without notifying the office, you may be dismissed from the clinic at the provider's discretion.      For prescription refill requests, have your pharmacy contact our office and allow 72 hours for refills to be completed.    Today you received the following chemotherapy and/or immunotherapy agents Kanjinti and Perjeta      To help prevent nausea and vomiting after your treatment, we encourage you to take your nausea medication as directed.  BELOW ARE SYMPTOMS THAT SHOULD BE REPORTED IMMEDIATELY: *FEVER GREATER THAN 100.4 F (38 C) OR HIGHER *CHILLS OR SWEATING *NAUSEA AND VOMITING THAT IS NOT CONTROLLED WITH YOUR NAUSEA MEDICATION *UNUSUAL SHORTNESS OF BREATH *UNUSUAL BRUISING OR BLEEDING *URINARY PROBLEMS (pain or burning when urinating, or frequent urination) *BOWEL PROBLEMS (unusual diarrhea, constipation, pain near the anus) TENDERNESS IN MOUTH AND THROAT WITH OR WITHOUT PRESENCE OF ULCERS (sore throat, sores in mouth, or a toothache) UNUSUAL RASH, SWELLING OR PAIN  UNUSUAL VAGINAL DISCHARGE OR ITCHING   Items with * indicate a potential emergency and should be followed up as soon as possible or go to the Emergency Department if any problems should occur.  Please show the CHEMOTHERAPY ALERT CARD or IMMUNOTHERAPY ALERT CARD at  check-in to the Emergency Department and triage nurse.  Should you have questions after your visit or need to cancel or reschedule your appointment, please contact [facility] [place] [facility]  [phone] and follow the prompts.  Office hours are 8:00 a.m. to 4:30 p.m. Monday - Friday. Please note that voicemails left after 4:00 p.m. may not be returned until the following business day.  We are closed weekends and major holidays. You have access to a nurse at all times for urgent questions. Please call the main number to the clinic [phone] and follow the prompts.  For any non-urgent questions, you may also contact your provider using MyChart. We now offer e-Visits for anyone 18 and older to request care online for non-urgent symptoms. For details visit [email or web address].   Also download the MyChart app! Go to the app store, search "MyChart", open the app, select [facility], and log in with your MyChart username and password.    

## 2023-10-06 NOTE — Progress Notes (Signed)
OK to proceed with ECHO   T.O. Dr Loman Chroman, PharmD

## 2023-10-10 NOTE — Addendum Note (Signed)
Encounter addended by: Loyal Gambler, RT on: 10/10/2023 8:34 AM  Actions taken: Imaging Exam ended

## 2023-10-11 ENCOUNTER — Ambulatory Visit
Admission: RE | Admit: 2023-10-11 | Discharge: 2023-10-11 | Disposition: A | Discharge status: Home / Self Care | Payer: Medicare HMO | Source: Ambulatory Visit | Attending: Oncology | Admitting: Oncology

## 2023-10-11 DIAGNOSIS — Z9013 Acquired absence of bilateral breasts and nipples: Secondary | ICD-10-CM | POA: Diagnosis not present

## 2023-10-11 DIAGNOSIS — C50411 Malignant neoplasm of upper-outer quadrant of right female breast: Secondary | ICD-10-CM | POA: Insufficient documentation

## 2023-10-11 DIAGNOSIS — Z170 Estrogen receptor positive status [ER+]: Secondary | ICD-10-CM | POA: Diagnosis not present

## 2023-10-11 LAB — GLUCOSE, CAPILLARY: Glucose-Capillary: 89 mg/dL (ref 70–99)

## 2023-10-11 MED ORDER — FLUDEOXYGLUCOSE F - 18 (FDG) INJECTION
9.1000 | Freq: Once | INTRAVENOUS | Status: AC | PRN
  Administered 2023-10-11: 9.92 via INTRAVENOUS

## 2023-10-17 ENCOUNTER — Other Ambulatory Visit: Payer: Self-pay

## 2023-10-17 ENCOUNTER — Encounter: Payer: Self-pay | Admitting: Oncology

## 2023-10-17 ENCOUNTER — Ambulatory Visit
Admission: RE | Admit: 2023-10-17 | Discharge: 2023-10-17 | Disposition: A | Discharge status: Home / Self Care | Payer: Medicare HMO | Source: Ambulatory Visit | Attending: Oncology | Admitting: Oncology

## 2023-10-17 DIAGNOSIS — Z79899 Other long term (current) drug therapy: Secondary | ICD-10-CM | POA: Diagnosis not present

## 2023-10-17 DIAGNOSIS — Z8781 Personal history of (healed) traumatic fracture: Secondary | ICD-10-CM | POA: Insufficient documentation

## 2023-10-17 DIAGNOSIS — Z7983 Long term (current) use of bisphosphonates: Secondary | ICD-10-CM | POA: Insufficient documentation

## 2023-10-17 DIAGNOSIS — M899 Disorder of bone, unspecified: Secondary | ICD-10-CM

## 2023-10-17 DIAGNOSIS — M069 Rheumatoid arthritis, unspecified: Secondary | ICD-10-CM | POA: Diagnosis not present

## 2023-10-17 DIAGNOSIS — C50411 Malignant neoplasm of upper-outer quadrant of right female breast: Secondary | ICD-10-CM

## 2023-10-17 DIAGNOSIS — Z90722 Acquired absence of ovaries, bilateral: Secondary | ICD-10-CM | POA: Diagnosis not present

## 2023-10-17 DIAGNOSIS — Z780 Asymptomatic menopausal state: Secondary | ICD-10-CM | POA: Diagnosis not present

## 2023-10-17 DIAGNOSIS — Z170 Estrogen receptor positive status [ER+]: Secondary | ICD-10-CM | POA: Insufficient documentation

## 2023-10-22 ENCOUNTER — Other Ambulatory Visit: Payer: Self-pay

## 2023-10-27 ENCOUNTER — Inpatient Hospital Stay: Payer: Medicare HMO | Attending: Oncology

## 2023-10-27 ENCOUNTER — Inpatient Hospital Stay: Payer: Medicare HMO

## 2023-10-27 ENCOUNTER — Encounter: Payer: Self-pay | Admitting: Oncology

## 2023-10-27 ENCOUNTER — Other Ambulatory Visit: Payer: Self-pay

## 2023-10-27 ENCOUNTER — Inpatient Hospital Stay (HOSPITAL_BASED_OUTPATIENT_CLINIC_OR_DEPARTMENT_OTHER): Payer: Medicare HMO | Admitting: Oncology

## 2023-10-27 VITALS — BP 106/94 | HR 99 | Temp 95.0°F | Resp 19 | Ht 67.0 in | Wt 180.1 lb

## 2023-10-27 VITALS — BP 113/71 | HR 111 | Temp 97.1°F | Resp 18 | Wt 180.1 lb

## 2023-10-27 DIAGNOSIS — G620 Drug-induced polyneuropathy: Secondary | ICD-10-CM | POA: Diagnosis not present

## 2023-10-27 DIAGNOSIS — D649 Anemia, unspecified: Secondary | ICD-10-CM

## 2023-10-27 DIAGNOSIS — C7951 Secondary malignant neoplasm of bone: Secondary | ICD-10-CM

## 2023-10-27 DIAGNOSIS — Z170 Estrogen receptor positive status [ER+]: Secondary | ICD-10-CM | POA: Diagnosis not present

## 2023-10-27 DIAGNOSIS — Z5112 Encounter for antineoplastic immunotherapy: Secondary | ICD-10-CM | POA: Insufficient documentation

## 2023-10-27 DIAGNOSIS — T451X5A Adverse effect of antineoplastic and immunosuppressive drugs, initial encounter: Secondary | ICD-10-CM

## 2023-10-27 DIAGNOSIS — E876 Hypokalemia: Secondary | ICD-10-CM

## 2023-10-27 DIAGNOSIS — M069 Rheumatoid arthritis, unspecified: Secondary | ICD-10-CM | POA: Diagnosis not present

## 2023-10-27 DIAGNOSIS — C50411 Malignant neoplasm of upper-outer quadrant of right female breast: Secondary | ICD-10-CM | POA: Insufficient documentation

## 2023-10-27 DIAGNOSIS — Z79899 Other long term (current) drug therapy: Secondary | ICD-10-CM | POA: Diagnosis not present

## 2023-10-27 DIAGNOSIS — I428 Other cardiomyopathies: Secondary | ICD-10-CM

## 2023-10-27 LAB — CBC WITH DIFFERENTIAL/PLATELET
Abs Immature Granulocytes: 0.02 10*3/uL (ref 0.00–0.07)
Basophils Absolute: 0 10*3/uL (ref 0.0–0.1)
Basophils Relative: 1 %
Eosinophils Absolute: 0.1 10*3/uL (ref 0.0–0.5)
Eosinophils Relative: 2 %
HCT: 30.7 % — ABNORMAL LOW (ref 36.0–46.0)
Hemoglobin: 10.2 g/dL — ABNORMAL LOW (ref 12.0–15.0)
Immature Granulocytes: 0 %
Lymphocytes Relative: 27 %
Lymphs Abs: 2 10*3/uL (ref 0.7–4.0)
MCH: 29.4 pg (ref 26.0–34.0)
MCHC: 33.2 g/dL (ref 30.0–36.0)
MCV: 88.5 fL (ref 80.0–100.0)
Monocytes Absolute: 0.6 10*3/uL (ref 0.1–1.0)
Monocytes Relative: 9 %
Neutro Abs: 4.5 10*3/uL (ref 1.7–7.7)
Neutrophils Relative %: 61 %
Platelets: 286 10*3/uL (ref 150–400)
RBC: 3.47 MIL/uL — ABNORMAL LOW (ref 3.87–5.11)
RDW: 14.1 % (ref 11.5–15.5)
WBC: 7.3 10*3/uL (ref 4.0–10.5)
nRBC: 0 % (ref 0.0–0.2)

## 2023-10-27 LAB — COMPREHENSIVE METABOLIC PANEL
ALT: 21 U/L (ref 0–44)
AST: 28 U/L (ref 15–41)
Albumin: 4.1 g/dL (ref 3.5–5.0)
Alkaline Phosphatase: 71 U/L (ref 38–126)
Anion gap: 8 (ref 5–15)
BUN: 20 mg/dL (ref 6–20)
CO2: 25 mmol/L (ref 22–32)
Calcium: 8.9 mg/dL (ref 8.9–10.3)
Chloride: 106 mmol/L (ref 98–111)
Creatinine, Ser: 0.75 mg/dL (ref 0.44–1.00)
GFR, Estimated: >60 mL/min (ref >60)
Glucose, Bld: 129 mg/dL — ABNORMAL HIGH (ref 70–99)
Potassium: 3.5 mmol/L (ref 3.5–5.1)
Sodium: 139 mmol/L (ref 135–145)
Total Bilirubin: 0.4 mg/dL (ref <1.2)
Total Protein: 7.3 g/dL (ref 6.5–8.1)

## 2023-10-27 LAB — FOLATE: Folate: 38 ng/mL (ref >5.9)

## 2023-10-27 LAB — RETIC PANEL
Immature Retic Fract: 10.7 % (ref 2.3–15.9)
RBC.: 3.45 MIL/uL — ABNORMAL LOW (ref 3.87–5.11)
Retic Count, Absolute: 44.2 10*3/uL (ref 19.0–186.0)
Retic Ct Pct: 1.3 % (ref 0.4–3.1)
Reticulocyte Hemoglobin: 33.2 pg (ref >27.9)

## 2023-10-27 LAB — IRON AND TIBC
Iron: 47 ug/dL (ref 28–170)
Saturation Ratios: 10 % — ABNORMAL LOW (ref 10.4–31.8)
TIBC: 476 ug/dL — ABNORMAL HIGH (ref 250–450)
UIBC: 429 ug/dL

## 2023-10-27 LAB — FERRITIN: Ferritin: 21 ng/mL (ref 11–307)

## 2023-10-27 MED ORDER — SODIUM CHLORIDE 0.9 % IV SOLN
Freq: Once | INTRAVENOUS | Status: AC
  Filled 2023-10-27: qty 250

## 2023-10-27 MED ORDER — SODIUM CHLORIDE 0.9 % IV SOLN
420.0000 mg | Freq: Once | INTRAVENOUS | Status: AC
  Administered 2023-10-27: 420 mg via INTRAVENOUS
  Filled 2023-10-27: qty 14

## 2023-10-27 MED ORDER — TRASTUZUMAB-ANNS CHEMO 150 MG IV SOLR
6.0000 mg/kg | Freq: Once | INTRAVENOUS | Status: AC
  Administered 2023-10-27: 462 mg via INTRAVENOUS
  Filled 2023-10-27: qty 22

## 2023-10-27 MED ORDER — HEPARIN SOD (PORK) LOCK FLUSH 100 UNIT/ML IV SOLN
500.0000 [IU] | Freq: Once | INTRAVENOUS | Status: AC | PRN
  Administered 2023-10-27: 500 [IU]
  Filled 2023-10-27: qty 5

## 2023-10-27 NOTE — Assessment & Plan Note (Signed)
status post radiation. Continue Zometa monthly, hold if Calcium is <8.6  - hold due to recent bone fracture/surgery - Continue calcium supplementation. Continue oxycodone as needed.

## 2023-10-27 NOTE — Assessment & Plan Note (Signed)
Continue calcium supplementation 600mg  TID.

## 2023-10-27 NOTE — Assessment & Plan Note (Signed)
Antibody treatment plan as list above. 

## 2023-10-27 NOTE — Progress Notes (Signed)
Hematology/Oncology Progress note [phone] [phone]    REASON FOR VISIT Follow up for  treatment of breast cancer   ASSESSMENT & PLAN:   Cancer Staging  Breast cancer of upper-outer quadrant of right female breast (HCC) Staging form: Breast, AJCC 8th Edition - Clinical: G2, ER+, PR+, HER2- - Signed by [name], MD on 04/05/2018 - Pathologic stage from 04/04/2018: No Stage Recommended (ypT3, pN2, cM0, G3, ER+, PR-, HER2-) - Signed by [name], MD on 04/04/2018 - Pathologic: Stage IV (rpTX, pNX, cM1, ER+, PR-, HER2+) - Signed by [name], MD on 12/10/2020   Breast cancer of upper-outer quadrant of right female breast (HCC) Recurrent breast cancer with isolated bone metastasis, stage IV #Initially stage IIIA right breast cancer, ER positive, HER-2 negative status post bilateral oophorectomy,Right mastectomy and right axillary lymph node dissection, status post implant, and implant removal.  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 ER positive, HER-2 positive  .- s/p Palliative radiation to T7  Labs reviewed and discussed Proceed with Kanjinti Perjecta.   Continue fulvestrant monthly. 10/20/23 PET NED, discussed with patient.  10/17/23 normal bone density on DEXA  Nonischemic cardiomyopathy (HCC) Chronic tachycardia Stable low-normal LVEF 50-55%. Follow up with cardiology. She gets ECHO Q3 months.  Neuropathy due to chemotherapeutic drug (HCC) continue Lyrica and nortriptyline symptoms stable  Rheumatoid arthritis (HCC) Follow up with  rheumatology.  Patient is on methotrexate,  plaquenil.    Hypocalcemia Continue calcium supplementation 600mg  TID.    Encounter for monoclonal antibody treatment for malignancy Antibody treatment plan as list above.  Malignant neoplasm metastatic to bone [facility]) status post radiation. Continue Zometa monthly, hold if Calcium is <8.6  -  hold due to recent bone fracture/surgery - Continue calcium supplementation. Continue oxycodone as needed.    Hypokalemia She does not tolerate oral potassium.  Potassium is stable.  Normocytic anemia Check iron tibc ferritin, retic panel, B12 and folate.   Patient declines influenza vaccination.   Orders Placed This Encounter  Procedures   Comprehensive metabolic panel    Standing Status:   Future    Standing Expiration Date:   01/04/2025   CBC with Differential    Standing Status:   Future    Standing Expiration Date:   01/04/2025   Iron and TIBC    Standing Status:   Future    Number of Occurrences:   1    Standing Expiration Date:   10/26/2024   Ferritin    Standing Status:   Future    Number of Occurrences:   1    Standing Expiration Date:   10/26/2024   Retic Panel    Standing Status:   Future    Number of Occurrences:   1    Standing Expiration Date:   10/26/2024   Folate    Standing Status:   Future    Number of Occurrences:   1    Standing Expiration Date:   10/26/2024   Follow up  lab Kanjinti Perjecta 3 weeks.  lab MD Kanjinti Perjecta 6 weeks.  fulvestrant Q 4 weeks   All questions were answered. The patient knows to call the clinic with any problems, questions or concerns.  [name], MD, PhD [facility] Hematology Oncology 10/27/2023       Oncology History  Oncology History Overview Note        Malignant neoplasm of overlapping sites of right breast in female, estrogen receptor  positive (HCC)  Breast cancer of upper-outer quadrant of right female breast (HCC)  10/19/2017 Initial Diagnosis   Malignant neoplasm of overlapping sites of right breast in female, estrogen receptor positive (HCC)   11/08/2017 - 01/03/2018 Chemotherapy   Neoadjuvant ddAC x 4 + 1 cycle of Taxol  due to lack of response, surgery was offered.   03/19/2018 Surgery   S/p right mastectomy and right axillary dissection, immediate breast reconstruction with placement of  expanders.  ypT3 ypN2, + lymphovascular invasion,  Grade 3, margin is negative, close. ER 90%, PR 0%, HER2 IHC negative.   # elective bilateral salpingo-oophorectomy..    06/11/2018 Imaging   s/p 11 cycles Taxol adjuvantly   10/10/2018 -  Radiation Therapy   adjuvant right chest wall radiation   06/03/2019 Surgery   underwent elective left prophylactic mastectomy and sentinel lymph node biopsy of left axilla Pathology negative for malignancy  # Mediport removal  # right implant removal on    07/07/2020 Imaging   MRI thoracic spine without contrast showed lesions involving the T7 posterior elements most concerning for metastatic lesion.  No evidence of epidural tumor.  Minimal thoracic spondylosis without stenosis. MRI was reviewed by me and a PET scan was obtained for further evaluation   07/20/2020 Imaging   PET scan showed hypermetabolic metastasis involving the posterior element of T7, no additional evidence of metastasis in the neck, chest, abdomen or pelvis.   07/29/2020 Procedure   T7 lesion biopsy showed metastatic carcinoma, compatible with breast origin.  Receptor status staining showed ER 71-80% positive, PR negative, HER-2 positive IHC 3+   08/31/2020 -  Radiation Therapy    finished spine radiation    09/17/2020 -  Chemotherapy   Patient is on Treatment Plan :  BREAST Trastuzumab + Pertuzumab q21d     12/10/2020 Cancer Staging   Staging form: Breast, AJCC 8th Edition - Pathologic: Stage IV (rpTX, pNX, cM1, ER+, PR-, HER2+) - Signed by [name], MD on 12/10/2020   03/15/2021 Imaging    PET scan showed no focal hypermetabolic activity to suggest skeletal metastasis.  Mild hypermetabolic activity along the right T7-8 paraspinal musculature.  Max SUV 3.3.  Likely postprocedural-   09/28/2021 Echocardiogram   further decrease of LVEF to 45%   10/14/2021 Imaging   PET  Post bilateral mastectomy and RIGHT axillary dissection without signs of recurrent or metastatic disease.    11/12/2021 Echocardiogram   LVEF of 45-50%.   02/18/2022 Imaging   MRI thoracic and lumbar spine w wo contrast  1. Negative MRIs of the thoracic and lumbar spine. No evidence for locally recurrent metastasis at the level of T7. No other new metastatic disease elsewhere with thoracolumbar spine. 2. No significant disc pathology, stenosis, or evidence for neural impingement.    02/25/2022 Echocardiogram   LVEF 55-60%.   03/16/2022 Imaging   PET showed Stable PET-CT. No findings for local recurrent breast cancer,locoregional adenopathy or distant metastatic disease.    07/20/2022 Imaging   MRI Thoracic Spine w wo contrast 1. Interval resolution of the increased T2 signal contrast enhancement in the posterior elements of T7. No new lesion identified. 2. No spinal canal or neural foraminal stenosis.   08/23/2022 Echocardiogram   Stable low-normal LVEF at 50-55%.   10/05/2022 Imaging   PET restaging Stable examination without evidence of hypermetabolic recurrence or metastatic disease.   Persistent minimally metabolic stranding in the posterior bilateral gluteal soft tissues may reflect sequela of subcutaneous injections   02/24/2023 Echocardiogram  Echocardiogram showed stable low normal LVEF 50-55%.   04/07/2023 Imaging   PET scan  1. Status post bilateral mastectomies without evidence of hypermetabolic local recurrence. 2. No evidence of hypermetabolic metastatic disease. 3. Asymmetric right-sided tonsillar FDG avidity is nonspecific consider further evaluation with direct visualization. 4. Gas fluid levels in the proximal colon may reflect a diarrheal illness or laxative use.      INTERVAL HISTORY 42 yo female with above oncology history reviewed by me presents for follow-up of management of metastatic Triple positive breast cancer.  Chronic back pain, unchanged.  Neck/upper back pain for 1 week Intermittent diarrhea, manageable.  No fever or chills. + arthralgia + right  ankle fracture, s/p surgery    Review of Systems  Constitutional:  Negative for chills, fever, malaise/fatigue and weight loss.  HENT:  Negative for sore throat.        Hair loss  Eyes:  Negative for redness.  Respiratory:  Negative for cough, shortness of breath and wheezing.   Cardiovascular:  Negative for chest pain, palpitations and leg swelling.  Gastrointestinal:  Negative for abdominal pain, blood in stool, heartburn, nausea and vomiting.  Genitourinary:  Negative for dysuria.  Musculoskeletal:  Positive for back pain and joint pain.  Skin:  Positive for rash.  Neurological:  Positive for tingling. Negative for dizziness and tremors.  Endo/Heme/Allergies:  Does not bruise/bleed easily.  Psychiatric/Behavioral:  Negative for hallucinations. The patient does not have insomnia.     No Known Allergies  Patient Active Problem List   Diagnosis Date Noted   Breast cancer of upper-outer quadrant of right female breast (HCC) 03/19/2018    Priority: High   Hypokalemia 06/03/2022    Priority: Medium    Malignant neoplasm metastatic to bone (HCC) 03/25/2021    Priority: Medium    Encounter for monoclonal antibody treatment for malignancy 12/10/2020    Priority: Medium    Hypocalcemia 11/19/2020    Priority: Medium    Neuropathy due to chemotherapeutic drug (HCC) 08/11/2020    Priority: Medium    Rheumatoid arthritis (HCC) 10/13/2019    Priority: Medium    Nonischemic cardiomyopathy (HCC) 08/23/2018    Priority: Medium    Folliculitis and perifolliculitis 04/14/2023    Priority: Low   Abnormal bowel movement 04/14/2023    Priority: Low   Right shoulder pain 01/20/2023    Priority: Low   Thyroid nodule 06/04/2022    Priority: Low   Encounter for antineoplastic chemotherapy 10/29/2020    Priority: Low   Goals of care, counseling/discussion 08/11/2020    Priority: Low   Gastroesophageal reflux disease without esophagitis 02/24/2017    Priority: Low   Malignant tumor of  breast (HCC) 07/11/2023   Stress fracture of metatarsal bone of right foot 07/11/2023   Anemia 06/23/2023   Change in bowel habits 06/23/2023   Closed fracture of fifth metatarsal bone 05/09/2023   Tachycardia, unspecified 04/15/2021   Bone lesion 11/19/2020   Inflammatory arthritis 11/19/2020   HER2-positive carcinoma of breast (HCC) 08/11/2020   Bilateral hand swelling 10/03/2019   Fracture of neck of metacarpal bone 05/14/2019   Chronic fatigue 04/09/2019   Polyarthralgia 04/09/2019   Status post right breast reconstruction 02/26/2019   Status post right mastectomy 02/26/2019   Mastalgia 02/15/2019   Shortness of breath 08/23/2018   Preprocedural cardiovascular examination 08/23/2018   Tachycardia 08/23/2018   Palpitations 08/23/2018   Estrogen receptor positive status (ER+) 04/04/2018   Acquired absence of right breast and nipple 04/03/2018  Family history of cancer    Malignant neoplasm of overlapping sites of right breast in female, estrogen receptor positive (HCC) 10/19/2017   Generalized anxiety disorder 10/03/2014   Headache 10/03/2014     Past Medical History:  Diagnosis Date   Anemia    Arthritis    BRCA negative 11/26/2017   Breast cancer (HCC) 10/11/2017   Multifocal, ER positive, PR negative, HER-2 negative. ypT3 ypN2a 8.7 cm, 4/15 nodes   Cardiomyopathy (HCC)    a. 10/2017 Echo: EF 60-65%, no rwma, Gr1 DD, nl RV size/fxn; b. 04/2018 Echo: EF 55-60%, no rwma, Nl RV size/fxn; c. 08/2018 Echo: EF 45%, diff HK, ? HK of antsept wall. Gr1 DD. Mild MR. Mild LAE/RAE. Mod dil RV.    Chronic bronchitis (HCC) 11/2017   COPD (chronic obstructive pulmonary disease) (HCC)    MILD PER CXR   Depression    Family history of cancer    GERD (gastroesophageal reflux disease)    Headache    MIGRAINES   Heart murmur    ASYMPTOMATIC   Personal history of chemotherapy    current for right breast ca     Past Surgical History:  Procedure Laterality Date   AXILLARY LYMPH  NODE DISSECTION Right 03/19/2018   Procedure: AXILLARY LYMPH NODE DISSECTION;  Surgeon: Earline Mayotte, MD;  Location: [facility] ORS;  Service: General;  Laterality: Right;   BIOPSY  06/23/2023   Procedure: BIOPSY;  Surgeon: Wyline Mood, MD;  Location: [facility] ENDOSCOPY;  Service: Gastroenterology;;   BREAST BIOPSY Right 10/11/2017   12:30 posterior coil clip invasive mammary carcinoma   BREAST BIOPSY Right 10/11/2017   11:30 middle depth ribbon clip DCIS   BREAST BIOPSY Right 10/11/2017   5:30 anterior depth x shape invasive ductal carcinoma   BREAST IMPLANT REMOVAL Right 06/03/2019   Procedure: REMOVAL OF RIGHT BREAST IMPLANTS;  Surgeon: Peggye Form, DO;  Location: [facility]S;  Service: Plastics;  Laterality: Right;   BREAST RECONSTRUCTION WITH PLACEMENT OF TISSUE EXPANDER AND FLEX HD (ACELLULAR HYDRATED DERMIS) Right 03/19/2018   Procedure: BREAST RECONSTRUCTION WITH PLACEMENT OF TISSUE EXPANDER AND FLEX HD (ACELLULAR HYDRATED DERMIS);  Surgeon: Peggye Form, DO;  Location: [facility];  Service: Plastics;  Laterality: Right;   CARPAL TUNNEL RELEASE Bilateral 2020   CHOLECYSTECTOMY N/A 04/27/2020   Procedure: LAPAROSCOPIC CHOLECYSTECTOMY WITH INTRAOPERATIVE CHOLANGIOGRAM;  Surgeon: Earline Mayotte, MD;  Location: [facility];  Service: General;  Laterality: N/A;   COLONOSCOPY WITH PROPOFOL N/A 06/23/2023   Procedure: COLONOSCOPY WITH PROPOFOL;  Surgeon: Wyline Mood, MD;  Location: [facility] ENDOSCOPY;  Service: Gastroenterology;  Laterality: N/A;   ESOPHAGOGASTRODUODENOSCOPY (EGD) WITH PROPOFOL N/A 04/17/2020   Procedure: ESOPHAGOGASTRODUODENOSCOPY (EGD) WITH PROPOFOL;  Surgeon: Earline Mayotte, MD;  Location: [facility] ENDOSCOPY;  Service: Endoscopy;  Laterality: N/A;  with biopsy   ESOPHAGOGASTRODUODENOSCOPY (EGD) WITH PROPOFOL N/A 06/23/2023   Procedure: ESOPHAGOGASTRODUODENOSCOPY (EGD) WITH PROPOFOL;  Surgeon: Wyline Mood, MD;  Location: [facility] ENDOSCOPY;  Service: Gastroenterology;  Laterality:  N/A;   LAPAROSCOPIC BILATERAL SALPINGO OOPHERECTOMY Bilateral 03/19/2018   Procedure: LAPAROSCOPIC BILATERAL SALPINGO OOPHORECTOMY;  Surgeon: Christeen Douglas, MD;  Location: [facility];  Service: Gynecology;  Laterality: Bilateral;   MASTECTOMY Right 03/2018   MASTECTOMY W/ SENTINEL NODE BIOPSY Right 03/19/2018   Procedure: MASTECTOMY WITH SENTINEL LYMPH NODE BIOPSY;  Surgeon: Earline Mayotte, MD;  Location: [facility];  Service: General;  Laterality: Right;   PORT-A-CATH REMOVAL Left 06/03/2019   Procedure: REMOVAL PORT-A-CATH;  Surgeon: Earline Mayotte, MD;  Location:  [facility];  Service: General;  Laterality: Left;   PORTACATH PLACEMENT Left 10/24/2017   Procedure: INSERTION PORT-A-CATH;  Surgeon: Earline Mayotte, MD;  Location: [facility];  Service: General;  Laterality: Left;   PORTACATH PLACEMENT Right 09/28/2020   Procedure: INSERTION PORT-A-CATH;  Surgeon: Earline Mayotte, MD;  Location: [facility];  Service: General;  Laterality: Right;   REMOVAL OF TISSUE EXPANDER AND PLACEMENT OF IMPLANT Right 07/20/2018   Procedure: REMOVAL OF RIGHT BREAST TISSUE EXPANDER AND PLACEMENT OF IMPLANT;  Surgeon: Peggye Form, DO;  Location: [facility];  Service: Plastics;  Laterality: Right;   SIMPLE MASTECTOMY WITH AXILLARY SENTINEL NODE BIOPSY Left 06/03/2019   Procedure: SIMPLE MASTECTOMY LEFT;  Surgeon: Earline Mayotte, MD;  Location: [facility];  Service: General;  Laterality: Left;    Social History   Socioeconomic History   Marital status: Married    Spouse name: Not on file   Number of children: Not on file   Years of education: Not on file   Highest education level: Not on file  Occupational History   Occupation: pharmacy tech    Comment: Acupuncturist [employer]   Tobacco Use   Smoking status: Former    Current packs/day: 0.00    Average packs/day: 0.5 packs/day for 18.0 years (9.0 ttl pk-yrs)    Types: Cigarettes    Start date: 06/21/2018    Quit  date: 12/11/2022    Years since quitting: 0.8   Smokeless tobacco: Never  Vaping Use   Vaping status: Never Used  Substance and Sexual Activity   Alcohol use: No   Drug use: No   Sexual activity: Yes    Birth control/protection: Injection, Other-see comments    Comment: has had hysterectomy  Other Topics Concern   Not on file  Social History Narrative   Lives at home with husband and daughter   Social Determinants of Health   Financial Resource Strain: Not on file  Food Insecurity: Not on file  Transportation Needs: Not on file  Physical Activity: Not on file  Stress: Not on file  Social Connections: Not on file  Intimate Partner Violence: Not on file     Family History  Problem Relation Age of Onset   Diabetes Father    Hypertension Father    Hyperlipidemia Father    Heart attack Father 83       "mild"   Melanoma Maternal Aunt        other aunts with BCC/SCC/Melanoma   Cervical cancer Maternal Aunt 26       daughter w/ cervical cancer as well   Lung cancer Maternal Aunt    Melanoma Maternal Uncle        other uncles with BCC/SCC/Melanoma   Breast cancer Paternal Aunt    Bladder Cancer Maternal Grandmother   Biological mother had Grave's disease.    Current Outpatient Medications:    acetaminophen (TYLENOL) 500 MG tablet, Take 500 mg by mouth every 6 (six) hours as needed., Disp: , Rfl:    albuterol (VENTOLIN HFA) 108 (90 Base) MCG/ACT inhaler, Inhale 2 puffs into the lungs every 6 (six) hours as needed for wheezing or shortness of breath. , Disp: , Rfl:    CALCIUM CARBONATE-VITAMIN D PO, Take 600-800 mg by mouth in the morning, at noon, and at bedtime., Disp: , Rfl:    chlorhexidine (PERIDEX) 0.12 % solution, Use as directed 15 mLs in the mouth or throat 2 (two) times daily., Disp:  120 mL, Rfl: 0   cyanocobalamin (,VITAMIN B-12,) 1000 MCG/ML injection, Inject 1,000 mcg into the skin every 30 (thirty) days., Disp: , Rfl:    diphenoxylate-atropine (LOMOTIL)  2.5-0.025 MG tablet, Take 1 tablet by mouth 4 (four) times daily as needed for diarrhea or loose stools., Disp: 60 tablet, Rfl: 0   escitalopram (LEXAPRO) 20 MG tablet, Take 20 mg by mouth daily., Disp: , Rfl:    esomeprazole (NEXIUM) 40 MG capsule, Take 40 mg by mouth daily before breakfast. , Disp: , Rfl:    Eszopiclone 3 MG TABS, Take 3 mg by mouth at bedtime as needed (sleep). , Disp: , Rfl:    folic acid (FOLVITE) 1 MG tablet, Take 1 mg by mouth daily., Disp: , Rfl:    hydrocortisone 2.5 % cream, , Disp: , Rfl:    hydroxychloroquine (PLAQUENIL) 200 MG tablet, Take 200 mg by mouth daily., Disp: , Rfl:    hydrOXYzine (ATARAX) 10 MG tablet, Take 1 tablet (10 mg total) by mouth every 6 (six) hours as needed., Disp: 60 tablet, Rfl: 5   ibuprofen (ADVIL) 800 MG tablet, Take 800 mg by mouth every 8 (eight) hours as needed for moderate pain., Disp: , Rfl:    loperamide (IMODIUM) 2 MG capsule, Take 1 tablet (2 mg total) by mouth See admin instructions. Take 2 tablets with onset of diarrhea, then take 1 tablet every 2 hours until diarrhea stops. Maximum 8 tablets in 24, Disp: 90 capsule, Rfl: 0   loratadine (CLARITIN) 10 MG tablet, Take 10 mg by mouth daily. , Disp: , Rfl:    LORazepam (ATIVAN) 1 MG tablet, Take 1 mg by mouth 3 (three) times daily., Disp: , Rfl:    Magnesium 500 MG TABS, Take 500 mg by mouth 2 (two) times daily., Disp: , Rfl:    methocarbamol (ROBAXIN) 500 MG tablet, Take by mouth., Disp: , Rfl:    methotrexate (RHEUMATREX) 2.5 MG tablet, Take 25 mg by mouth every Sunday. 10 tablets once a week, Disp: , Rfl:    metoprolol tartrate (LOPRESSOR) 25 MG tablet, Take 0.5 tablets (12.5 mg total) by mouth as needed., Disp: 30 tablet, Rfl: 0   mupirocin ointment (BACTROBAN) 2 %, Place 1 application into the nose 2 (two) times daily. Use in each nostril twice daily for five (5) days., Disp: 22 g, Rfl: 5   nortriptyline (PAMELOR) 10 MG capsule, Take 30 mg by mouth at bedtime. , Disp: , Rfl:     oxyCODONE (OXY IR/ROXICODONE) 5 MG immediate release tablet, Take 1 tablet (5 mg total) by mouth every 6 (six) hours as needed for moderate pain or severe pain., Disp: 90 tablet, Rfl: 0   phentermine (ADIPEX-P) 37.5 MG tablet, Take 37.5 mg by mouth daily before breakfast. , Disp: , Rfl:    pregabalin (LYRICA) 150 MG capsule, Take 150 mg by mouth 2 (two) times daily., Disp: , Rfl:    promethazine (PHENERGAN) 25 MG tablet, Take 1 tablet (25 mg total) by mouth every 8 (eight) hours as needed for nausea or vomiting., Disp: 90 tablet, Rfl: 0   pyridOXINE (VITAMIN B-6) 100 MG tablet, Take 100 mg by mouth daily., Disp: , Rfl:    topiramate (TOPAMAX) 50 MG tablet, Take 50 mg by mouth 2 (two) times daily. , Disp: , Rfl:    vitamin C (ASCORBIC ACID) 500 MG tablet, Take 500 mg by mouth daily., Disp: , Rfl:  No current facility-administered medications for this visit.  Facility-Administered Medications Ordered in Other  Visits:    heparin lock flush 100 unit/mL, 500 Units, Intravenous, Once, [name], MD   heparin lock flush 100 unit/mL, 500 Units, Intracatheter, Once PRN, [name], MD   pertuzumab (PERJETA) 420 mg in sodium chloride 0.9 % 250 mL chemo infusion, 420 mg, Intravenous, Once, [name], MD, Last Rate: 528 mL/hr at 10/27/23 1045, 420 mg at 10/27/23 1045   sodium chloride flush (NS) 0.9 % injection 10 mL, 10 mL, Intravenous, PRN, [name], MD, 10 mL at 11/19/18 1249   sodium chloride flush (NS) 0.9 % injection 10 mL, 10 mL, Intravenous, Once, [name], MD   Physical exam:  Vitals:   10/27/23 0837  BP: 113/71  Pulse: (!) 111  Resp: 18  Temp: (!) 97.1 F (36.2 C)  Weight: 180 lb 1.6 oz (81.7 kg)  ECOG 1 Physical Exam Constitutional:      Appearance: Normal appearance.  HENT:     Head: Normocephalic and atraumatic.  Eyes:     General: No scleral icterus. Neck:     Vascular: No JVD.  Cardiovascular:     Rate and Rhythm: Normal rate.  Pulmonary:     Effort: Pulmonary effort is normal.  No respiratory distress.     Breath sounds: Normal breath sounds.  Abdominal:     General: There is no distension.     Palpations: Abdomen is soft.  Musculoskeletal:     Cervical back: Normal range of motion.     Comments: Right lower extremity with dressing.   Lymphadenopathy:     Cervical: No cervical adenopathy.  Skin:    General: Skin is warm.     Findings: No erythema or rash.  Neurological:     Mental Status: She is alert and oriented to person, place, and time. Mental status is at baseline.     Cranial Nerves: No cranial nerve deficit.     Motor: No abnormal muscle tone.  Psychiatric:        Mood and Affect: Mood and affect normal.        Labs     Latest Ref Rng & Units 10/27/2023    8:22 AM  CMP  Glucose 70 - 99 mg/dL 469   BUN 6 - 20 mg/dL 20   Creatinine 6.29 - 1.00 mg/dL 5.28   Sodium 413 - 244 mmol/L 139   Potassium 3.5 - 5.1 mmol/L 3.5   Chloride 98 - 111 mmol/L 106   CO2 22 - 32 mmol/L 25   Calcium 8.9 - 10.3 mg/dL 8.9   Total Protein 6.5 - 8.1 g/dL 7.3   Total Bilirubin <0.1 mg/dL 0.4   Alkaline Phos 38 - 126 U/L 71   AST 15 - 41 U/L 28   ALT 0 - 44 U/L 21       Latest Ref Rng & Units 10/27/2023    8:22 AM  CBC  WBC 4.0 - 10.5 K/uL 7.3   Hemoglobin 12.0 - 15.0 g/dL 02.7   Hematocrit 25.3 - 46.0 % 30.7   Platelets 150 - 400 K/uL 286    RADIOGRAPHIC STUDIES: I have personally reviewed the radiological images as listed and agreed with the findings in the report. NM PET Image Restag (PS) Skull Base To Thigh  Result Date: 10/20/2023 CLINICAL DATA:  Subsequent treatment strategy for breast cancer. EXAM: NUCLEAR MEDICINE PET SKULL BASE TO THIGH TECHNIQUE: 9.92 mCi F-18 FDG was injected intravenously. Full-ring PET imaging was performed from the skull base to thigh after the radiotracer. CT  data was obtained and used for attenuation correction and anatomic localization. Fasting blood glucose: 89 mg/dl COMPARISON:  Multiple prior PET CTs.  The most  recent is 04/07/2023 FINDINGS: Mediastinal blood pool activity: SUV max 2.19 Liver activity: SUV max NA NECK: No hypermetabolic lymph nodes in the neck. Incidental CT findings: None. CHEST: Surgical changes from bilateral mastectomies. No findings for recurrent chest wall tumor, supraclavicular axillary adenopathy. No enlarged or hypermetabolic mediastinal or hilar lymph nodes. No worrisome pulmonary nodules on the chest CT to suggest pulmonary metastatic disease. No pleural effusions or pleural lesions. Incidental CT findings: The Port-A-Cath is stable. ABDOMEN/PELVIS: No abnormal hypermetabolic activity within the liver, pancreas, adrenal glands, or spleen. No hypermetabolic lymph nodes in the abdomen or pelvis. Incidental CT findings: Status post cholecystectomy. No biliary dilatation. SKELETON: No focal hypermetabolic activity to suggest skeletal metastasis. Incidental CT findings: No worrisome lytic or sclerotic bone lesion on the CT scan. IMPRESSION: 1. Surgical changes from bilateral mastectomies. No findings for recurrent chest wall tumor, supraclavicular or axillary adenopathy. 2. No findings for metastatic disease involving the chest, abdomen/pelvis or bony structures. Electronically Signed   By: Rudie Meyer M.D.   On: 10/20/2023 14:30   DG Bone Density  Result Date: 10/17/2023 EXAM: DUAL X-RAY ABSORPTIOMETRY (DXA) FOR BONE MINERAL DENSITY IMPRESSION: Your patient Bendetta Soltani completed a BMD test on 10/17/2023 using the [employer] Advance DXA System (analysis version: 14.10) manufactured by [employer]. The following summarizes the results of our evaluation. Technologist: LCE PATIENT BIOGRAPHICAL: Name: Neela, Berk[place] Patient ID: [id number] Birth Date: 09/20/1981 Height: 67.0 in. Gender: Female Exam Date: 10/17/2023 Weight: 178.2 lbs. Indications: Caucasian, Early Menopause, High Risk Medications, History of Fracture (Adult), History of Metastatic Breast Cancer, Hysterectomy, Oophorectomy  Bilateral, POSTmenopausal, Rheumatoid Arthritis Fractures: Foot Right Treatments: Calcium, Vitamin D, Zometa ASSESSMENT: The BMD measured at Femur Neck Right is 0.910 g/cm2 with a T-score of -0.9. This patient is considered normal according to World Health Organization [facility]) criteria. L-1 and L-2 were excluded due to degenerative changes/compression fracture, etc. The scan quality is good. Site Region Measured Measured WHO Young Adult BMD Date       Age      Classification T-score AP Spine L3-L4 10/17/2023 40.7 Normal 0.1 1.207 g/cm2 DualFemur Neck Right 10/17/2023 40.7 Normal -0.9 0.910 g/cm2 World Health Organization [facility]) criteria for post-menopausal, Caucasian Women: Normal:       T-score at or above -1 SD Osteopenia:   T-score between -1 and -2.5 SD Osteoporosis: T-score at or below -2.5 SD RECOMMENDATIONS: 1. All patients should optimize calcium and vitamin D intake. 2. Consider FDA-approved medical therapies in postmenopausal women and men aged 16 years and older, based on the following: a. A hip or vertebral (clinical or morphometric) fracture b. T-score < -2.5 at the femoral neck or spine after appropriate evaluation to exclude secondary causes c. Low bone mass (T-score between -1.0 and -2.5 at the femoral neck or spine) and a 10-year probability of a hip fracture > 3% or a 10-year probability of a major osteoporosis-related fracture > 20% based on the [place]-adapted WHO algorithm d. Clinician judgment and/or patient preferences may indicate treatment for people with 10-year fracture probabilities above or below these levels FOLLOW-UP: People with diagnosed cases of osteoporosis or at high risk for fracture should have regular bone mineral density tests. For patients eligible for Medicare, routine testing is allowed once every 2 years. The testing frequency can be increased to one year for patients who have rapidly progressing  disease, those who are receiving or discontinuing medical therapy to restore bone  mass, or have additional risk factors. I have reviewed this report, and agree with the above findings. [facility] Electronically Signed   By: Romona Curls M.D.   On: 10/17/2023 09:24   ECHOCARDIOGRAM LIMITED  Result Date: 08/31/2023    ECHOCARDIOGRAM LIMITED REPORT   Patient Name:   APIFFANY HURLBUTT Date of Exam: 08/31/2023 Medical Rec #:  [id number]      Height:       67.0 in Accession #:    [id number]     Weight:       168.0 lb Date of Birth:  05/19/1981      BSA:          1.878 m Patient Age:    42 years       BP:           110/64 mmHg Patient Gender: F              HR:           96 bpm. Exam Location:   Procedure: 2D Echo, Limited Echo, Intracardiac Opacification Agent, Limited            Color Doppler and Color Doppler Indications:    I42.80 Non-ischemic cardiomyopathy  History:        Patient has prior history of Echocardiogram examinations, most                 recent 05/30/2023. Cardiomyopathy, COPD, Signs/Symptoms:Murmur                 and Shortness of Breath; Risk Factors:Former Smoker.  Sonographer:    Ilda Mori MHA, BS, RDCS Referring Phys: [phone] Sondra Barges  Sonographer Comments: Suboptimal apical window. Image acquisition challenging due to mastectomy and Image acquisition challenging due to COPD. IMPRESSIONS  1. Left ventricular ejection fraction, by estimation, is 45 to 50%. The left ventricle has mildly decreased function. The left ventricle demonstrates global hypokinesis. Left ventricular diastolic parameters are indeterminate.  2. Right ventricular systolic function is normal. The right ventricular size is normal. Tricuspid regurgitation signal is inadequate for assessing PA pressure.  3. The mitral valve is normal in structure. No evidence of mitral valve regurgitation. No evidence of mitral stenosis.  4. The aortic valve is normal in structure. Aortic valve regurgitation is not visualized. No aortic stenosis is present.  5. The inferior vena cava is normal in size  with greater than 50% respiratory variability, suggesting right atrial pressure of 3 mmHg. FINDINGS  Left Ventricle: Left ventricular ejection fraction, by estimation, is 45 to 50%. The left ventricle has mildly decreased function. The left ventricle demonstrates global hypokinesis. The left ventricular internal cavity size was normal in size. There is  no left ventricular hypertrophy. Left ventricular diastolic parameters are indeterminate. Right Ventricle: The right ventricular size is normal. No increase in right ventricular wall thickness. Right ventricular systolic function is normal. Tricuspid regurgitation signal is inadequate for assessing PA pressure. Left Atrium: Left atrial size was normal in size. Right Atrium: Right atrial size was normal in size. Pericardium: There is no evidence of pericardial effusion. Mitral Valve: The mitral valve is normal in structure. No evidence of mitral valve stenosis. Tricuspid Valve: The tricuspid valve is normal in structure. Tricuspid valve regurgitation is mild . No evidence of tricuspid stenosis. Aortic Valve: The aortic valve is normal in structure. Aortic valve regurgitation is not visualized. No aortic stenosis is present.  Pulmonic Valve: The pulmonic valve was normal in structure. Pulmonic valve regurgitation is not visualized. No evidence of pulmonic stenosis. Aorta: The aortic root is normal in size and structure. Venous: The inferior vena cava is normal in size with greater than 50% respiratory variability, suggesting right atrial pressure of 3 mmHg. IAS/Shunts: No atrial level shunt detected by color flow Doppler. LEFT VENTRICLE PLAX 2D LVIDd:         4.50 cm      Diastology LVIDs:         3.20 cm      LV e' medial:  14.30 cm/s LV PW:         0.70 cm      LV e' lateral: 19.00 cm/s LV IVS:        0.70 cm  LV Volumes (MOD) LV vol d, MOD A2C: 136.0 ml LV vol d, MOD A4C: 126.0 ml LV vol s, MOD A2C: 76.4 ml LV vol s, MOD A4C: 62.5 ml LV SV MOD A2C:     59.6 ml LV SV  MOD A4C:     126.0 ml LV SV MOD BP:      62.4 ml Julien Nordmann MD Electronically signed by Julien Nordmann MD Signature Date/Time: 08/31/2023/6:28:16 PM    Final

## 2023-10-27 NOTE — Assessment & Plan Note (Addendum)
Recurrent breast cancer with isolated bone metastasis, stage IV #Initially stage IIIA right breast cancer, ER positive, HER-2 negative status post bilateral oophorectomy,Right mastectomy and right axillary lymph node dissection, status post implant, and implant removal.  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 ER positive, HER-2 positive  .- s/p Palliative radiation to T7  Labs reviewed and discussed Proceed with Kanjinti Perjecta.   Continue fulvestrant monthly. 10/20/23 PET NED, discussed with patient.  10/17/23 normal bone density on DEXA

## 2023-10-27 NOTE — Assessment & Plan Note (Signed)
Chronic tachycardia Stable low-normal LVEF 50-55%. Follow up with cardiology. She gets ECHO Q3 months. 

## 2023-10-27 NOTE — Assessment & Plan Note (Signed)
continue Lyrica and nortriptyline symptoms stable 

## 2023-10-27 NOTE — Assessment & Plan Note (Signed)
She does not tolerate oral potassium.  Potassium is stable. 

## 2023-10-27 NOTE — Assessment & Plan Note (Signed)
Check iron tibc ferritin, retic panel, B12 and folate.

## 2023-10-27 NOTE — Patient Instructions (Signed)
[facility] - A DEPT OF [facility]  Discharge Instructions: Thank you for choosing [facility] to provide your oncology and hematology care.  If you have a lab appointment with the Cancer Center, please go directly to the Cancer Center and check in at the registration area.  Wear comfortable clothing and clothing appropriate for easy access to any Portacath or PICC line.   We strive to give you quality time with your provider. You may need to reschedule your appointment if you arrive late (15 or more minutes).  Arriving late affects you and other patients whose appointments are after yours.  Also, if you miss three or more appointments without notifying the office, you may be dismissed from the clinic at the provider's discretion.      For prescription refill requests, have your pharmacy contact our office and allow 72 hours for refills to be completed.    Today you received the following chemotherapy and/or immunotherapy agents kanjinti and perjeta      To help prevent nausea and vomiting after your treatment, we encourage you to take your nausea medication as directed.  BELOW ARE SYMPTOMS THAT SHOULD BE REPORTED IMMEDIATELY: *FEVER GREATER THAN 100.4 F (38 C) OR HIGHER *CHILLS OR SWEATING *NAUSEA AND VOMITING THAT IS NOT CONTROLLED WITH YOUR NAUSEA MEDICATION *UNUSUAL SHORTNESS OF BREATH *UNUSUAL BRUISING OR BLEEDING *URINARY PROBLEMS (pain or burning when urinating, or frequent urination) *BOWEL PROBLEMS (unusual diarrhea, constipation, pain near the anus) TENDERNESS IN MOUTH AND THROAT WITH OR WITHOUT PRESENCE OF ULCERS (sore throat, sores in mouth, or a toothache) UNUSUAL RASH, SWELLING OR PAIN  UNUSUAL VAGINAL DISCHARGE OR ITCHING   Items with * indicate a potential emergency and should be followed up as soon as possible or go to the Emergency Department if any problems should occur.  Please show the CHEMOTHERAPY ALERT CARD or  IMMUNOTHERAPY ALERT CARD at check-in to the Emergency Department and triage nurse.  Should you have questions after your visit or need to cancel or reschedule your appointment, please contact [place] [facility] - A DEPT OF [facility]  [phone] and follow the prompts.  Office hours are 8:00 a.m. to 4:30 p.m. Monday - Friday. Please note that voicemails left after 4:00 p.m. may not be returned until the following business day.  We are closed weekends and major holidays. You have access to a nurse at all times for urgent questions. Please call the main number to the clinic [phone] and follow the prompts.  For any non-urgent questions, you may also contact your provider using MyChart. We now offer e-Visits for anyone 51 and older to request care online for non-urgent symptoms. For details visit [email or web address].   Also download the MyChart app! Go to the app store, search "MyChart", open the app, select , and log in with your MyChart username and password.

## 2023-10-27 NOTE — Assessment & Plan Note (Signed)
Follow up with  rheumatology.  Patient is on methotrexate,  plaquenil.  

## 2023-11-03 ENCOUNTER — Ambulatory Visit: Payer: Medicare HMO

## 2023-11-03 ENCOUNTER — Inpatient Hospital Stay: Payer: Medicare HMO

## 2023-11-03 VITALS — BP 102/78 | HR 104 | Temp 95.8°F | Resp 18

## 2023-11-03 DIAGNOSIS — Z170 Estrogen receptor positive status [ER+]: Secondary | ICD-10-CM

## 2023-11-03 DIAGNOSIS — Z5112 Encounter for antineoplastic immunotherapy: Secondary | ICD-10-CM | POA: Diagnosis not present

## 2023-11-03 MED ORDER — ZOLEDRONIC ACID 4 MG/100ML IV SOLN
4.0000 mg | Freq: Once | INTRAVENOUS | Status: AC
Start: 2023-11-03 — End: 2023-11-03
  Administered 2023-11-03: 4 mg via INTRAVENOUS
  Filled 2023-11-03: qty 100

## 2023-11-03 MED ORDER — FULVESTRANT 250 MG/5ML IM SOSY
500.0000 mg | PREFILLED_SYRINGE | INTRAMUSCULAR | Status: DC
  Administered 2023-11-03: 500 mg via INTRAMUSCULAR
  Filled 2023-11-03: qty 10

## 2023-11-03 MED ORDER — SODIUM CHLORIDE 0.9 % IV SOLN
Freq: Once | INTRAVENOUS | Status: AC
  Filled 2023-11-03: qty 250

## 2023-11-15 ENCOUNTER — Encounter: Payer: Self-pay | Admitting: Oncology

## 2023-11-16 ENCOUNTER — Telehealth: Payer: Self-pay | Admitting: *Deleted

## 2023-11-16 ENCOUNTER — Other Ambulatory Visit: Payer: Self-pay | Admitting: Oncology

## 2023-11-16 ENCOUNTER — Other Ambulatory Visit: Payer: Self-pay

## 2023-11-16 DIAGNOSIS — Z170 Estrogen receptor positive status [ER+]: Secondary | ICD-10-CM

## 2023-11-16 NOTE — Telephone Encounter (Signed)
CAll returned to patient and informed of doctor response. She states she will see if her PCP can do the testing Her next appointment is 12/20, Does she need to come back before then or just keep that appointment?

## 2023-11-16 NOTE — Telephone Encounter (Signed)
Patient called reporting that she is having a cough and congestion blowing out and coughing up green mucous. She denies fever and states that her daughter was taken to doctor yesterday for same and was put on a Zpak. Daughter  had been tested for COVID and Flu and both were negative. Patient is scheduled for chemotherapy tomorrow and is asking if she is ok to come or does she need to reschedule it. Does she need antibiotics too? Please advise

## 2023-11-17 ENCOUNTER — Other Ambulatory Visit: Payer: Self-pay

## 2023-11-17 ENCOUNTER — Inpatient Hospital Stay: Payer: Medicare HMO | Attending: Oncology

## 2023-11-17 ENCOUNTER — Inpatient Hospital Stay: Payer: Medicare HMO

## 2023-11-22 ENCOUNTER — Other Ambulatory Visit: Payer: Self-pay | Admitting: Oncology

## 2023-11-22 ENCOUNTER — Telehealth: Payer: Self-pay | Admitting: *Deleted

## 2023-11-22 DIAGNOSIS — Z170 Estrogen receptor positive status [ER+]: Secondary | ICD-10-CM

## 2023-11-22 MED ORDER — OXYCODONE HCL 5 MG PO TABS: 5.0000 mg | ORAL_TABLET | Freq: Four times a day (QID) | ORAL | 0 refills | Status: DC | PRN

## 2023-11-22 NOTE — Telephone Encounter (Signed)
Patient called and reports that she was sick last week and her husband is sick this week and went to physician yesterday and came home with 5 prescriptions. She is trying to keep her distance from him, but knows that this is contagious as her daughter had it first thenm  her now her husband. She has appointment for treatment tomorrow and is asking if she needs to postpone it so she does not infect anyone here with this and just all her treatment next Friday, the 20th or is it safe for her to come tomorrow. Please advise

## 2023-11-22 NOTE — Telephone Encounter (Signed)
Patient hesitant to come tomorrow is going to see if scheduling can move er to next Friday and if not she will come tomorrow. Transferred her to scheduling line

## 2023-11-22 NOTE — Telephone Encounter (Signed)
Per Dr Cathie Hoops if she is not sick, ok to keep appt

## 2023-11-22 NOTE — Telephone Encounter (Signed)
Per Abbie: "pt is on the phone. She stated she cannot come tomorrow and wanted to do if she could do " all of her infusions" next Friday.   IS has been updated.

## 2023-11-23 ENCOUNTER — Other Ambulatory Visit: Payer: Self-pay

## 2023-11-23 ENCOUNTER — Inpatient Hospital Stay: Payer: Medicare HMO

## 2023-11-24 ENCOUNTER — Encounter: Payer: Self-pay | Admitting: Oncology

## 2023-11-27 NOTE — Progress Notes (Signed)
Cardiology Office Note    Date:  11/28/2023   ID:  Theresa Chaney, DOB [birth year], MRN [id number]  PCP:  Leanna Sato, MD  Cardiologist:  Yvonne Kendall, MD  Electrophysiologist:  None   Chief Complaint: Follow-up  History of Present Illness:   Theresa Chaney is a 42 y.o. female with history of recurrent breast cancer (initial diagnosis 10/19/2017 status post neoadjuvant ddAC x 4 post 1 cycle of Taxol status post right mastectomy status post 11 cycles of Taxol and radiation with prophylactic left mastectomy in 2020 with recurrence of malignancy noted on MRI of the thoracic spine in 06/2020 with hypermetabolic metastasis involving posterior elements of T7 on PET scan status post chemoradiation) complicated by a mildly reduced LVEF, WCT, paroxysmal SVT, lymphedema, COPD, anemia, migraine disorder, and GERD who presents for follow up of her cardiomyopathy.   She was previously evaluated in 10/2017 for intermittent sinus tachycardia and DOE. At that time echoes in 10/2017 and 04/2018 showed normal LV function and it was ultimately felt her symptoms were secondary to chemotherapy. In 08/2018, she experienced upper extremity swelling with repeat echo at that time showing an EF of 45% with question of anteroseptal hypokinesis and moderate right ventricular enlargement. Upon her primary cardiologist reviewing the echo, it was felt her EF was closer to 50-55%. She was placed on Toprol XL. Lexiscan Myoview in 10/2018 showed a medium defect of moderate severity present in the the basal anterior and mild anterior location, EF 55-65%. This was a very suboptimal study due to breast attenuation artifact as well as extracardiac uptake at the inferior border of the heart. There was reversible anterior wall defect suggestive of ischemia, though it was felt this was likely artifact given the defect did not extend to the apex. She was seen in the office in 11/2018 for follow up and was feeling well outside of  intermittent palpitations over the prior couple of weeks with report of heart rates of 150 bpm. Episodes would typically last a few minutes. In this setting, she wore a Zio monitor that showed the predominant rhythm was sinus with an average heart rate of 82 bpm (range 48-175 bpm). Rare PACs and PVCs were noted with a single atrial run lasting 4 beats. There were no sustained arrhythmias or prolonged pauses. Patient triggered events corresponded to sinus rhythm and artifact. In this setting, her Toprol XL was increased to 25 mg. She underwent echo on 02/19/2019 which showed improvement in her EF to 55-60%, normal diastolic function, and no significant valvular abnormalities. She was seen in the office in 02/2019 for presyncope. Repeat cardiac monitoring at that time showed a predominant rhythm of sinus with an average heart rate of 88 bpm (range 47-181 bpm in sinus), 2 episodes of WCT favored to be NSVT over SVT with aberrancy lasting up to 7 beats, and rare PACs/PVCs. She has continued to undergo periodic echoes for chemotherapy evaluation as outlined below.    She has undergone periodic echocardiograms for chemotherapy monitoring which have demonstrated:      Echo in 10/2017 demonstrated an EF of 60 to 65%, normal wall motion, grade 1 diastolic dysfunction, normal RV systolic function, normal PASP, and no significant valvular abnormalities.   Echo in 04/2018 showed an EF of 55 to 60%, normal wall motion, normal LV diastolic function.   Echo in 08/2018 showed an EF of 45%, diffuse hypokinesis, grade 1 diastolic dysfunction, mild mitral regurgitation, mild biatrial enlargement, and moderately dilated RV.   Echo in  01/2020 demonstrated an EF of 60 to 65%, no regional wall motion abnormalities, normal RV systolic function and ventricular cavity size, and no significant valvular abnormalities.     Echo in 09/2020 continues to show a preserved LV systolic function with an EF of 60 to 65%, grade 1 diastolic  dysfunction, normal RV systolic function and ventricular cavity size, and no significant valvular abnormalities.     Echo in 01/2021 demonstrated an EF of 55-60%, no RWMA, normal RV systolic function and ventricular cavity size, and no significant valvular abnormalities.    Echo in 04/2021 showed an EF of 55 to 60%, no regional wall motion abnormalities, normal RV systolic function and ventricular cavity size, and no significant valvular abnormalities.   Echo in 08/2021 showed a low normal LV systolic function with an EF of 50 to 55%, normal LV diastolic function parameters, normal RV systolic function and ventricular cavity size, and no significant valvular abnormalities.     Echo from 09/28/2021 demonstrated an EF of 45%, global hypokinesis, normal RV systolic function and ventricular cavity size, and trivial mitral regurgitation.     Following noted drop in LV systolic function, trastuzumab and pertuzumab were held.  She was seen in follow-up in 09/2021 with relative hypotension precluding escalation of GDMT.  She underwent repeat echo in 11/2021 which demonstrated an EF of 45 to 50%, normal RV systolic function and ventricular cavity size, and no significant valvular abnormalities.  Given persistent mild LV dysfunction, Toprol was decreased to 12.5 mg and she was initiated on losartan 12.5 mg.  Subsequent repeat echo on 02/25/2022 demonstrated an improvement in her LV systolic function with an EF of 55 to 60%, no regional wall motion abnormalities, grade 1 diastolic dysfunction, normal RV systolic function and ventricular cavity size, no significant valvular abnormalities, and an estimated right atrial pressure of 3 mmHg.     Echo in 05/2022 showed an EF of 50 to 55%, no regional wall motion abnormalities, normal RV systolic function and ventricular cavity size, no significant valvular abnormalities, and an estimated right atrial pressure of 3 mmHg.   With noted lightheadedness/dizziness was  subsequently recommended she hold losartan in 06/2022.   Echo from 08/2022 55 to 55%, no regional wall motion abnormalities, grade 1 diastolic dysfunction, normal RV systolic function and ventricular cavity size, trivial mitral regurgitation, and an estimated right atrial pressure of 3 mmHg.   Echo from 11/2022 showed an EF of 50-55%, no regional wall motion abnormalities, indeterminate diastolic function, normal RV systolic function and ventricular cavity size, no significant valvular abnormalities, and an estimated right atrial pressure of 3 mmHg.   With ongoing fatigue and positional dizziness, Toprol-XL was held in 11/2022.  Given stability in LVEF, different of rechallenge an ARB/beta-blocker was deferred.   Echo from 02/2023 showed an EF of 50 to 55%, no regional wall motion abnormalities, normal LV diastolic function parameters, normal RV systolic function and ventricular cavity size, no significant valvular abnormalities, and an estimated right atrial pressure of 3 mmHg.  Echo from 05/2023 showed an EF of 50 to 55%, no regional wall motion normalities, normal RV systolic function and ventricular cavity size, and no significant valvular abnormalities.  Echo from 08/2023 showed an EF of 45 to 50% with primary cardiologist review indicating echo was unchanged with an EF of 50 to 55%, global hypokinesis, normal RV systolic function and ventricular cavity size, no significant valvular abnormalities, and an estimated right atrial pressure of 3 mmHg.   Most recent PET scan from  10/2023 was without evidence of hypermetabolic local recurrence or hypermetabolic metastatic disease.     She comes in doing well from a cardiac perspective and is without symptoms of angina or cardiac decompensation.  She notes chronic stable fatigue and indicates that she does not sleep well at night.  She does remain active at baseline.  She has had some intermittent nonsustained palpitations, though has not needed any as  needed metoprolol.  Overall feels well from a cardiac perspective.  Preliminary read of echo today with EF of 55%.   Labs independently reviewed: 10/2023 - potassium 3.5, BUN 20, serum creatinine 0.75, albumin 4.1, AST/ALT normal, Hgb 10.2, PLT 286 03/2023 - magnesium 2.1 01/2023 - TSH normal  Past Medical History:  Diagnosis Date   Anemia    Arthritis    BRCA negative 11/26/2017   Breast cancer (HCC) 10/11/2017   Multifocal, ER positive, PR negative, HER-2 negative. ypT3 ypN2a 8.7 cm, 4/15 nodes   Cardiomyopathy (HCC)    a. 10/2017 Echo: EF 60-65%, no rwma, Gr1 DD, nl RV size/fxn; b. 04/2018 Echo: EF 55-60%, no rwma, Nl RV size/fxn; c. 08/2018 Echo: EF 45%, diff HK, ? HK of antsept wall. Gr1 DD. Mild MR. Mild LAE/RAE. Mod dil RV.    Chronic bronchitis (HCC) 11/2017   COPD (chronic obstructive pulmonary disease) (HCC)    MILD PER CXR   Depression    Family history of cancer    GERD (gastroesophageal reflux disease)    Headache    MIGRAINES   Heart murmur    ASYMPTOMATIC   Personal history of chemotherapy    current for right breast ca    Past Surgical History:  Procedure Laterality Date   AXILLARY LYMPH NODE DISSECTION Right 03/19/2018   Procedure: AXILLARY LYMPH NODE DISSECTION;  Surgeon: Earline Mayotte, MD;  Location: [facility] ORS;  Service: General;  Laterality: Right;   BIOPSY  06/23/2023   Procedure: BIOPSY;  Surgeon: Wyline Mood, MD;  Location: [facility] ENDOSCOPY;  Service: Gastroenterology;;   BREAST BIOPSY Right 10/11/2017   12:30 posterior coil clip invasive mammary carcinoma   BREAST BIOPSY Right 10/11/2017   11:30 middle depth ribbon clip DCIS   BREAST BIOPSY Right 10/11/2017   5:30 anterior depth x shape invasive ductal carcinoma   BREAST IMPLANT REMOVAL Right 06/03/2019   Procedure: REMOVAL OF RIGHT BREAST IMPLANTS;  Surgeon: Peggye Form, DO;  Location: [facility];  Service: Plastics;  Laterality: Right;   BREAST RECONSTRUCTION WITH PLACEMENT OF TISSUE EXPANDER  AND FLEX HD (ACELLULAR HYDRATED DERMIS) Right 03/19/2018   Procedure: BREAST RECONSTRUCTION WITH PLACEMENT OF TISSUE EXPANDER AND FLEX HD (ACELLULAR HYDRATED DERMIS);  Surgeon: Peggye Form, DO;  Location: [facility];  Service: Plastics;  Laterality: Right;   CARPAL TUNNEL RELEASE Bilateral 2020   CHOLECYSTECTOMY N/A 04/27/2020   Procedure: LAPAROSCOPIC CHOLECYSTECTOMY WITH INTRAOPERATIVE CHOLANGIOGRAM;  Surgeon: Earline Mayotte, MD;  Location: [facility];  Service: General;  Laterality: N/A;   COLONOSCOPY WITH PROPOFOL N/A 06/23/2023   Procedure: COLONOSCOPY WITH PROPOFOL;  Surgeon: Wyline Mood, MD;  Location: [facility] ENDOSCOPY;  Service: Gastroenterology;  Laterality: N/A;   ESOPHAGOGASTRODUODENOSCOPY (EGD) WITH PROPOFOL N/A 04/17/2020   Procedure: ESOPHAGOGASTRODUODENOSCOPY (EGD) WITH PROPOFOL;  Surgeon: Earline Mayotte, MD;  Location: [facility] ENDOSCOPY;  Service: Endoscopy;  Laterality: N/A;  with biopsy   ESOPHAGOGASTRODUODENOSCOPY (EGD) WITH PROPOFOL N/A 06/23/2023   Procedure: ESOPHAGOGASTRODUODENOSCOPY (EGD) WITH PROPOFOL;  Surgeon: Wyline Mood, MD;  Location: [facility] ENDOSCOPY;  Service: Gastroenterology;  Laterality: N/A;   LAPAROSCOPIC  BILATERAL SALPINGO OOPHERECTOMY Bilateral 03/19/2018   Procedure: LAPAROSCOPIC BILATERAL SALPINGO OOPHORECTOMY;  Surgeon: Christeen Douglas, MD;  Location: [facility];  Service: Gynecology;  Laterality: Bilateral;   MASTECTOMY Right 03/2018   MASTECTOMY W/ SENTINEL NODE BIOPSY Right 03/19/2018   Procedure: MASTECTOMY WITH SENTINEL LYMPH NODE BIOPSY;  Surgeon: Earline Mayotte, MD;  Location: [facility];  Service: General;  Laterality: Right;   PORT-A-CATH REMOVAL Left 06/03/2019   Procedure: REMOVAL PORT-A-CATH;  Surgeon: Earline Mayotte, MD;  Location: [facility];  Service: General;  Laterality: Left;   PORTACATH PLACEMENT Left 10/24/2017   Procedure: INSERTION PORT-A-CATH;  Surgeon: Earline Mayotte, MD;  Location: [facility];  Service: General;  Laterality: Left;    PORTACATH PLACEMENT Right 09/28/2020   Procedure: INSERTION PORT-A-CATH;  Surgeon: Earline Mayotte, MD;  Location: [facility];  Service: General;  Laterality: Right;   REMOVAL OF TISSUE EXPANDER AND PLACEMENT OF IMPLANT Right 07/20/2018   Procedure: REMOVAL OF RIGHT BREAST TISSUE EXPANDER AND PLACEMENT OF IMPLANT;  Surgeon: Peggye Form, DO;  Location: [facility];  Service: Plastics;  Laterality: Right;   SIMPLE MASTECTOMY WITH AXILLARY SENTINEL NODE BIOPSY Left 06/03/2019   Procedure: SIMPLE MASTECTOMY LEFT;  Surgeon: Earline Mayotte, MD;  Location: [facility];  Service: General;  Laterality: Left;    Current Medications: Current Meds  Medication Sig   acetaminophen (TYLENOL) 500 MG tablet Take 500 mg by mouth every 6 (six) hours as needed.   albuterol (VENTOLIN HFA) 108 (90 Base) MCG/ACT inhaler Inhale 2 puffs into the lungs every 6 (six) hours as needed for wheezing or shortness of breath.    CALCIUM CARBONATE-VITAMIN D PO Take 600-800 mg by mouth in the morning, at noon, and at bedtime.   chlorhexidine (PERIDEX) 0.12 % solution Use as directed 15 mLs in the mouth or throat 2 (two) times daily.   cyanocobalamin (,VITAMIN B-12,) 1000 MCG/ML injection Inject 1,000 mcg into the skin every 30 (thirty) days.   diphenoxylate-atropine (LOMOTIL) 2.5-0.025 MG tablet Take 1 tablet by mouth 4 (four) times daily as needed for diarrhea or loose stools.   escitalopram (LEXAPRO) 20 MG tablet Take 20 mg by mouth daily.   esomeprazole (NEXIUM) 40 MG capsule Take 40 mg by mouth daily before breakfast.    Eszopiclone 3 MG TABS Take 3 mg by mouth at bedtime as needed (sleep).    folic acid (FOLVITE) 1 MG tablet Take 1 mg by mouth daily.   hydrocortisone 2.5 % cream    hydroxychloroquine (PLAQUENIL) 200 MG tablet Take 200 mg by mouth daily.   hydrOXYzine (ATARAX) 10 MG tablet Take 1 tablet (10 mg total) by mouth every 6 (six) hours as needed.   ibuprofen (ADVIL) 800 MG tablet Take 800  mg by mouth every 8 (eight) hours as needed for moderate pain.   loperamide (IMODIUM) 2 MG capsule Take 1 tablet (2 mg total) by mouth See admin instructions. Take 2 tablets with onset of diarrhea, then take 1 tablet every 2 hours until diarrhea stops. Maximum 8 tablets in 24   loratadine (CLARITIN) 10 MG tablet Take 10 mg by mouth daily.    LORazepam (ATIVAN) 1 MG tablet Take 1 mg by mouth 3 (three) times daily.   Magnesium 500 MG TABS Take 500 mg by mouth 2 (two) times daily.   methocarbamol (ROBAXIN) 500 MG tablet Take by mouth.   methotrexate (RHEUMATREX) 2.5 MG tablet Take 25 mg by mouth every Sunday. 10 tablets once a week  metoprolol tartrate (LOPRESSOR) 25 MG tablet Take 0.5 tablets (12.5 mg total) by mouth as needed.   mupirocin ointment (BACTROBAN) 2 % Place 1 application into the nose 2 (two) times daily. Use in each nostril twice daily for five (5) days.   nortriptyline (PAMELOR) 10 MG capsule Take 30 mg by mouth at bedtime.    oxyCODONE (OXY IR/ROXICODONE) 5 MG immediate release tablet Take 1 tablet (5 mg total) by mouth every 6 (six) hours as needed for moderate pain (pain score 4-6) or severe pain (pain score 7-10).   phentermine (ADIPEX-P) 37.5 MG tablet Take 37.5 mg by mouth daily before breakfast.    pregabalin (LYRICA) 150 MG capsule Take 150 mg by mouth 2 (two) times daily.   promethazine (PHENERGAN) 25 MG tablet Take 1 tablet (25 mg total) by mouth every 8 (eight) hours as needed for nausea or vomiting.   pyridOXINE (VITAMIN B-6) 100 MG tablet Take 100 mg by mouth daily.   topiramate (TOPAMAX) 50 MG tablet Take 50 mg by mouth 2 (two) times daily.    vitamin C (ASCORBIC ACID) 500 MG tablet Take 500 mg by mouth daily.    Allergies:   Patient has no known allergies.   Social History   Socioeconomic History   Marital status: Married    Spouse name: Not on file   Number of children: Not on file   Years of education: Not on file   Highest education level: Not on file   Occupational History   Occupation: pharmacy tech    Comment: Acupuncturist [employer]   Tobacco Use   Smoking status: Former    Current packs/day: 0.00    Average packs/day: 0.5 packs/day for 18.0 years (9.0 ttl pk-yrs)    Types: Cigarettes    Start date: 06/21/2018    Quit date: 12/11/2022    Years since quitting: 0.9   Smokeless tobacco: Never  Vaping Use   Vaping status: Never Used  Substance and Sexual Activity   Alcohol use: No   Drug use: No   Sexual activity: Yes    Birth control/protection: Injection, Other-see comments    Comment: has had hysterectomy  Other Topics Concern   Not on file  Social History Narrative   Lives at home with husband and daughter   Social Drivers of Corporate investment banker Strain: Not on file  Food Insecurity: Not on file  Transportation Needs: Not on file  Physical Activity: Not on file  Stress: Not on file  Social Connections: Not on file     Family History:  The patient's family history includes Bladder Cancer in her maternal grandmother; Breast cancer in her paternal aunt; Cervical cancer (age of onset: 67) in her maternal aunt; Diabetes in her father; Heart attack (age of onset: 70) in her father; Hyperlipidemia in her father; Hypertension in her father; Lung cancer in her maternal aunt; Melanoma in her maternal aunt and maternal uncle.  ROS:   12-point review of systems is negative unless otherwise noted in the HPI.   EKGs/Labs/Other Studies Reviewed:    Studies reviewed were summarized above. The additional studies were reviewed today:  Limited echo 11/28/2023: Preliminary read with EF of 55% __________  Limited echo 05/30/2023: 1. Left ventricular ejection fraction, by estimation, is 50 to 55%. Left  ventricular ejection fraction by PLAX is 53 %. The left ventricle has low  normal function. The left ventricle has no regional wall motion  abnormalities. Left ventricular diastolic  parameters  are  indeterminate. The average left ventricular global  longitudinal strain is -13.6 %.   2. Right ventricular systolic function is normal. The right ventricular  size is normal. Tricuspid regurgitation signal is inadequate for assessing  PA pressure.   3. The mitral valve is normal in structure. No evidence of mitral valve  regurgitation.   4. The aortic valve is normal in structure. Aortic valve regurgitation is  not visualized. No aortic stenosis is present.   5. The inferior vena cava is normal in size with greater than 50%  respiratory variability, suggesting right atrial pressure of 3 mmHg.  __________   Limited echo 02/24/2023: 1. Left ventricular ejection fraction, by estimation, is 50 to 55%. Left  ventricular ejection fraction by 3D volume is 52 %. The left ventricle has  low normal function. The left ventricle has no regional wall motion  abnormalities. Left ventricular  diastolic parameters were normal. The average left ventricular global  longitudinal strain is -18.7 %.   2. Right ventricular systolic function is normal. The right ventricular  size is normal.   3. The mitral valve is normal in structure. No evidence of mitral valve  regurgitation. No evidence of mitral stenosis.   4. The aortic valve has an indeterminant number of cusps. Aortic valve  regurgitation is not visualized. No aortic stenosis is present.   5. The inferior vena cava is normal in size with greater than 50%  respiratory variability, suggesting right atrial pressure of 3 mmHg.  __________   Limited echo 11/25/2022: 1. Left ventricular ejection fraction, by estimation, is 50 to 55%. The  left ventricle has low normal function. The left ventricle has no regional  wall motion abnormalities. Left ventricular diastolic parameters are  indeterminate. The average left  ventricular global longitudinal strain is -10.5 %. The global longitudinal  strain is abnormal.   2. Right ventricular systolic function is  normal. The right ventricular  size is normal.   3. The mitral valve is normal in structure. No evidence of mitral valve  regurgitation. No evidence of mitral stenosis.   4. The aortic valve was not well visualized. Aortic valve regurgitation  is not visualized. No aortic stenosis is present.   5. The inferior vena cava is normal in size with greater than 50%  respiratory variability, suggesting right atrial pressure of 3 mmHg.  ___________   Limited echo 08/23/2022: 1. Left ventricular ejection fraction, by estimation, is 50 to 55%. The  left ventricle has low normal function. The left ventricle has no regional  wall motion abnormalities. Left ventricular diastolic parameters are  consistent with Grade I diastolic  dysfunction (impaired relaxation). The average left ventricular global  longitudinal strain is -15.4 %.   2. Right ventricular systolic function is normal. The right ventricular  size is normal.   3. The mitral valve is normal in structure. Trivial mitral valve  regurgitation. No evidence of mitral stenosis.   4. The aortic valve was not well visualized. Aortic valve regurgitation  is not visualized. No aortic stenosis is present.   5. The inferior vena cava is normal in size with greater than 50%  respiratory variability, suggesting right atrial pressure of 3 mmHg.  __________   Limited echo 05/17/2022: 1. Left ventricular ejection fraction, by estimation, is 50 to 55%. The  left ventricle has low normal function. The left ventricle has no regional  wall motion abnormalities. The average left ventricular global  longitudinal strain is -11.7 %.   2. Right ventricular  systolic function is normal. The right ventricular  size is normal.   3. The mitral valve is normal in structure. No evidence of mitral valve  regurgitation. No evidence of mitral stenosis.   4. The aortic valve is normal in structure. Aortic valve regurgitation is  not visualized. No aortic stenosis is  present.   5. The inferior vena cava is normal in size with greater than 50%  respiratory variability, suggesting right atrial pressure of 3 mmHg.   Comparison(s): 02/25/2022-EF 60%-65%.  __________   2D echo 02/25/2022: 1. Left ventricular ejection fraction, by estimation, is 55 to 60%. The  left ventricle has normal function. The left ventricle has no regional  wall motion abnormalities. Left ventricular diastolic parameters are  consistent with Grade I diastolic  dysfunction (impaired relaxation).   2. Right ventricular systolic function is normal. The right ventricular  size is normal.   3. The mitral valve is normal in structure. No evidence of mitral valve  regurgitation. No evidence of mitral stenosis.   4. The aortic valve is normal in structure. Aortic valve regurgitation is  not visualized. No aortic stenosis is present.   5. The inferior vena cava is normal in size with greater than 50%  respiratory variability, suggesting right atrial pressure of 3 mmHg.   Comparison(s): Previous limited echo reported LVEF of 45-50%. __________   Limited echo 11/11/2021: 1. Left ventricular ejection fraction, by estimation, is 45 to 50%. Left  ventricular ejection fraction by 2D MOD biplane is 46.8 %. The left  ventricle has mildly decreased function.   2. Right ventricular systolic function is normal. The right ventricular  size is normal.   3. The mitral valve is normal in structure. No evidence of mitral valve  regurgitation. __________   2D echo 09/28/2021: 1. Left ventricular ejection fraction, by estimation, is 45%. The left  ventricle has mild to moderately decreased function. The left ventricle  demonstrates global hypokinesis. Left ventricular diastolic parameters  were normal. The average left  ventricular global longitudinal strain is -15.0 %. The global longitudinal  strain is abnormal.   2. Right ventricular systolic function is normal. The right ventricular  size is  normal. Tricuspid regurgitation signal is inadequate for assessing  PA pressure.   3. The mitral valve is normal in structure. Trivial mitral valve  regurgitation. No evidence of mitral stenosis.   4. The aortic valve is tricuspid. Aortic valve regurgitation is not  visualized. No aortic stenosis is present.   Comparison(s): A prior study was performed on 08/31/2021. The left  ventricular function is slightly worse.  __________   2D echo 08/31/2021: 1. Left ventricular ejection fraction, by estimation, is 50 to 55%. The  left ventricle has low normal function. Left ventricular endocardial  border not optimally defined to evaluate regional wall motion. Left  ventricular diastolic parameters were  normal. The average left ventricular global longitudinal strain is -13.4  %. The global longitudinal strain is abnormal.   2. Right ventricular systolic function is normal. The right ventricular  size is normal.   3. The mitral valve is normal in structure. No evidence of mitral valve  regurgitation. No evidence of mitral stenosis.   4. The aortic valve is tricuspid. Aortic valve regurgitation is trivial.  No aortic stenosis is present.  __________   Limited 2D echo 05/05/2021: 1. Left ventricular ejection fraction, by estimation, is 55 to 60%. The  left ventricle has normal function. The left ventricle has no regional  wall motion abnormalities.  2. Right ventricular systolic function is normal. The right ventricular  size is normal.   3. The mitral valve is normal in structure. No evidence of mitral valve  regurgitation.   4. The aortic valve is tricuspid. Aortic valve regurgitation is not  visualized.  __________   Limited 2D echo 01/29/2021:  1. Left ventricular ejection fraction, by estimation, is 55 to 60%. The  left ventricle has normal function. The left ventricle has no regional  wall motion abnormalities. Left ventricular diastolic function could not  be evaluated.   2. Right  ventricular systolic function is normal. The right ventricular  size is normal.   3. The mitral valve is normal in structure. Trivial mitral valve  regurgitation.   4. The aortic valve is normal in structure. Aortic valve regurgitation is  not visualized.  __________   Limited 2D echo 09/14/2020: 1. Left ventricular ejection fraction, by estimation, is 60 to 65%. Left  ventricular ejection fraction by PLAX is 68 %. The left ventricle has  normal function. Left ventricular diastolic parameters are consistent with  Grade I diastolic dysfunction  (impaired relaxation).   2. Right ventricular systolic function is normal. The right ventricular  size is normal.   3. The mitral valve was not well visualized. No evidence of mitral valve  regurgitation.   4. Aortic valve regurgitation is not visualized.  __________   2D echo 01/29/2020: 1. Left ventricular ejection fraction, by estimation, is 60 to 65%. The  left ventricle has normal function. The left ventricle has no regional  wall motion abnormalities. Indeterminate diastolic filling due to E-A  fusion.   2. Right ventricular systolic function is normal. The right ventricular  size is normal.   3. The mitral valve is normal in structure and function. No evidence of  mitral valve regurgitation. No evidence of mitral stenosis.   4. The aortic valve is normal in structure and function. Aortic valve  regurgitation is not visualized. No aortic stenosis is present.  __________   Luci Bank patch 02/2019: The patient was monitored for 7 days. The predominant rhythm was sinus with an average rate of 88 bpm (range 47 to 181 bpm in sinus). Rare PAC's and PVC's were noted. Two episodes of wide-complex tachycardia (favor nonsustained ventricular tachycardia over supraventricular tachycardia with aberrancy) occurred, lasting up to 7 beats. Patient triggered events correspond to normal sinus rhythm and narrow complex tachycardia (favor sinus tachycardia).    Predominantly sinus rhythm with two brief episodes of wide-complex tachycardia (favor NSVT over SVT with aberrancy).  Rare PAC's and PVC's also noted.  Patient triggered events correspond to sinus rhythm and narrow complex tachycardia (favor sinus tachycardia but cannot exclude atrial tachycardia). __________   Limited 2D echo 02/19/2019: 1. The left ventricle has normal systolic function, with an ejection  fraction of 55-60%. Left ventricular diastolic parameters were normal.   2. The right ventricle has normal systolc function. The cavity was  normal. There is no increase in right ventricular wall thickness.   3. The aortic valve was not well visualized. __________   Luci Bank patch 11/2018: The patient was monitored for 13 days, 8 hours. The predominant rhythm was sinus with an average rate of 82 bpm (range 48 to 175 bpm). Rare PACs and PVCs were noted. A single atrial run lasting 4 beats was noted. There was no sustained arrhythmia or prolonged pause. Patient triggered events correspond to sinus rhythm and artifact.   Predominantly sinus rhythm with rare PACs and PVCs.  Brief atrial  run lasting 4 beats noted. __________   Eugenie Birks MPI 10/12/2018: T wave inversion was noted during stress in the III, II, aVF, V3, V2, V4, V5 and V6 leads, and returning to baseline after 1-5 mins of recovery. There was no ST segment deviation noted during stress. Defect 1: There is a medium defect of moderate severity present in the basal anterior and mid anterior location. The left ventricular ejection fraction is normal (55-65%). Very suboptimal study due to breast attenuation artifact as well as extracardiac uptake at the inferior border of the heart. There is a reversible anterior wall defect suggestive of ischemia. However, I suspect that this is likely artifact given that the defect does not extend to the apex. Correlate clinically. __________   2D echo 08/15/2018: - Left ventricle: The cavity size was  mildly dilated. Systolic    function was mildly reduced. The estimated ejection fraction was    45%. Diffuse hypokinesis. Hypokinesis of the anteroseptal    myocardium. Doppler parameters are consistent with abnormal left    ventricular relaxation (grade 1 diastolic dysfunction).  - Aortic valve: Valve area (VTI): 1.88 cm^2. Valve area (Vmax): 1.8    cm^2. Valve area (Vmean): 1.74 cm^2.  - Mitral valve: There was mild regurgitation. Valve area by    continuity equation (using LVOT flow): 2.22 cm^2.  - Left atrium: The atrium was mildly dilated.  - Right ventricle: The cavity size was moderately dilated.  - Right atrium: The atrium was mildly dilated.   Impressions:   - Mild LV systolic dysfunction with wall motion abnormalities    suggestive of CAD.  __________   2D echo 04/26/2018: - Left ventricle: The cavity size was normal. Wall thickness was    normal. Systolic function was normal. The estimated ejection    fraction was in the range of 55% to 60%. Wall motion was normal;    there were no regional wall motion abnormalities. Left    ventricular diastolic function parameters were normal.  - Pulmonary arteries: Systolic pressure could not be accurately    estimated.  __________   2D echo 10/26/2017: - Left ventricle: The cavity size was normal. Systolic function was    normal. The estimated ejection fraction was in the range of 60%    to 65%. Wall motion was normal; there were no regional wall    motion abnormalities. Doppler parameters are consistent with    abnormal left ventricular relaxation (grade 1 diastolic    dysfunction).  - Left atrium: The atrium was normal in size.  - Right ventricle: Systolic function was normal.  - Pulmonary arteries: Systolic pressure was within the normal    range.    EKG:  Rhythm strip today demonstrates sinus tachycardia, 111 bpm, no acute ST-T changes  Recent Labs: 01/20/2023: TSH 1.318 03/24/2023: Magnesium 2.1 10/27/2023: ALT 21; BUN  20; Creatinine, Ser 0.75; Hemoglobin 10.2; Platelets 286; Potassium 3.5; Sodium 139  Recent Lipid Panel No results found for: "CHOL", "TRIG", "HDL", "CHOLHDL", "VLDL", "LDLCALC", "LDLDIRECT"  PHYSICAL EXAM:    VS:  BP 118/78 (BP Location: Left Arm, Patient Position: Sitting)   Pulse (!) 108   Ht 5\' 7"  (1.702 m)   Wt 173 lb 12.8 oz (78.8 kg)   LMP  (LMP Unknown) Comment: Ovaries and tubes removed.April 2019  SpO2 98%   BMI 27.22 kg/m   BMI: Body mass index is 27.22 kg/m.  Physical Exam Vitals reviewed.  Constitutional:      Appearance: She is well-developed.  HENT:  Head: Normocephalic and atraumatic.  Eyes:     General:        Right eye: No discharge.        Left eye: No discharge.  Cardiovascular:     Rate and Rhythm: Regular rhythm. Tachycardia present.     Heart sounds: Normal heart sounds, S1 normal and S2 normal. Heart sounds not distant. No midsystolic click and no opening snap. No murmur heard.    No friction rub.  Pulmonary:     Effort: Pulmonary effort is normal. No respiratory distress.     Breath sounds: Normal breath sounds. No decreased breath sounds, wheezing, rhonchi or rales.  Chest:     Chest wall: No tenderness.  Abdominal:     General: There is no distension.  Musculoskeletal:     Cervical back: Normal range of motion.     Right lower leg: No edema.     Left lower leg: No edema.  Skin:    General: Skin is warm and dry.     Nails: There is no clubbing.  Neurological:     Mental Status: She is alert and oriented to person, place, and time.  Psychiatric:        Speech: Speech normal.        Behavior: Behavior normal.        Thought Content: Thought content normal.        Judgment: Judgment normal.     Wt Readings from Last 3 Encounters:  11/28/23 173 lb 12.8 oz (78.8 kg)  10/27/23 180 lb 1.5 oz (81.7 kg)  10/27/23 180 lb 1.6 oz (81.7 kg)     ASSESSMENT & PLAN:   Chemotherapy-induced systolic dysfunction/NICM: Euvolemic and well  compensated.  Preliminary read of limited echo performed this morning showed a stable EF of 55% off beta-blocker and ARB.  Weight down 7 pounds today when compared to her visit in 08/2023.  In the context of stable LV systolic function and with history of relative hypotension with underlying fatigue, continue to defer rechallenge of beta-blocker or ARB.  Should she have recurrence of cardiomyopathy, or develops symptoms of volume overload, would look to reinitiate GDMT at that time.  Continue active lifestyle.  No indication for standing loop diuretic.  Continue to monitor echo every 3 months.  WCT/paroxysmal SVT/palpitations: She has noted a few palpitations, though has not needed any as needed Lopressor.  Recurrent breast cancer with isolated bone metastasis: Most recent PET scan from 10/2023 showed no evidence of local or metastatic hypermetabolic activity. Ongoing management per oncology.      Disposition: F/u with Dr. Okey Dupre or an APP in 3 months.   Medication Adjustments/Labs and Tests Ordered: Current medicines are reviewed at length with the patient today.  Concerns regarding medicines are outlined above. Medication changes, Labs and Tests ordered today are summarized above and listed in the Patient Instructions accessible in Encounters.   Signed, Eula Listen, PA-C 11/28/2023 10:20 AM     [facility] [place] [address] [place], [place] [address] [phone]

## 2023-11-28 ENCOUNTER — Encounter: Payer: Self-pay | Admitting: Physician Assistant

## 2023-11-28 ENCOUNTER — Ambulatory Visit: Payer: Medicare HMO | Admitting: Physician Assistant

## 2023-11-28 ENCOUNTER — Ambulatory Visit: Payer: Medicare HMO | Attending: Physician Assistant

## 2023-11-28 VITALS — BP 118/78 | HR 108 | Ht 67.0 in | Wt 173.8 lb

## 2023-11-28 DIAGNOSIS — T451X5A Adverse effect of antineoplastic and immunosuppressive drugs, initial encounter: Secondary | ICD-10-CM

## 2023-11-28 DIAGNOSIS — C50411 Malignant neoplasm of upper-outer quadrant of right female breast: Secondary | ICD-10-CM | POA: Diagnosis not present

## 2023-11-28 DIAGNOSIS — I471 Supraventricular tachycardia, unspecified: Secondary | ICD-10-CM

## 2023-11-28 DIAGNOSIS — R000 Tachycardia, unspecified: Secondary | ICD-10-CM

## 2023-11-28 DIAGNOSIS — I427 Cardiomyopathy due to drug and external agent: Secondary | ICD-10-CM

## 2023-11-28 DIAGNOSIS — I428 Other cardiomyopathies: Secondary | ICD-10-CM | POA: Diagnosis not present

## 2023-11-28 DIAGNOSIS — T451X5D Adverse effect of antineoplastic and immunosuppressive drugs, subsequent encounter: Secondary | ICD-10-CM

## 2023-11-28 DIAGNOSIS — Z170 Estrogen receptor positive status [ER+]: Secondary | ICD-10-CM

## 2023-11-28 LAB — ECHOCARDIOGRAM LIMITED
Calc EF: 55.5 %
S' Lateral: 3.8 cm
Single Plane A2C EF: 55.7 %
Single Plane A4C EF: 49.8 %

## 2023-11-28 MED ORDER — PERFLUTREN LIPID MICROSPHERE
1.0000 mL | INTRAVENOUS | Status: AC | PRN
  Administered 2023-11-28: 3 mL via INTRAVENOUS

## 2023-11-28 NOTE — Patient Instructions (Signed)
Medication Instructions:  Your Physician recommend you continue on your current medication as directed.    *If you need a refill on your cardiac medications before your next appointment, please call your pharmacy*   Lab Work: None ordered at this time    Testing/Procedures: Your physician has requested that you have an LIMITED echocardiogram. Echocardiography is a painless test that uses sound waves to create images of your heart. It provides your doctor with information about the size and shape of your heart and how well your heart's chambers and valves are working.   You may receive an ultrasound enhancing agent through an IV if needed to better visualize your heart during the echo. This procedure takes approximately one hour.  There are no restrictions for this procedure.  This will take place at [address] [facility] [address], [place] [address]  Please note: We ask at that you not bring children with you during ultrasound (echo/ vascular) testing. Due to room size and safety concerns, children are not allowed in the ultrasound rooms during exams. Our front office staff cannot provide observation of children in our lobby area while testing is being conducted. An adult accompanying a patient to their appointment will only be allowed in the ultrasound room at the discretion of the ultrasound technician under special circumstances. We apologize for any inconvenience.    Follow-Up: At [facility], you and your health needs are our priority.  As part of our continuing mission to provide you with exceptional heart care, we have created designated Provider Care Teams.  These Care Teams include your primary Cardiologist (physician) and Advanced Practice Providers (APPs -  Physician Assistants and Nurse Practitioners) who all work together to provide you with the care you need, when you need it.  We recommend signing up for the patient portal called "MyChart".  Sign up  information is provided on this After Visit Summary.  MyChart is used to connect with patients for Virtual Visits (Telemedicine).  Patients are able to view lab/test results, encounter notes, upcoming appointments, etc.  Non-urgent messages can be sent to your provider as well.   To learn more about what you can do with MyChart, go to [email or web address].    Your next appointment:   3 month(s) and on the same day as the LIMITED echo  Provider:   You may see Yvonne Kendall, MD or one of the following Advanced Practice Providers on your designated Care Team:   Eula Listen, [place]

## 2023-11-30 ENCOUNTER — Other Ambulatory Visit: Payer: Self-pay

## 2023-12-01 ENCOUNTER — Inpatient Hospital Stay: Payer: Medicare HMO | Attending: Oncology

## 2023-12-01 ENCOUNTER — Ambulatory Visit: Payer: Medicare HMO

## 2023-12-01 ENCOUNTER — Inpatient Hospital Stay: Payer: Medicare HMO

## 2023-12-01 VITALS — BP 115/65 | HR 98 | Temp 97.0°F | Resp 18 | Wt 179.7 lb

## 2023-12-01 DIAGNOSIS — C50411 Malignant neoplasm of upper-outer quadrant of right female breast: Secondary | ICD-10-CM | POA: Insufficient documentation

## 2023-12-01 DIAGNOSIS — Z170 Estrogen receptor positive status [ER+]: Secondary | ICD-10-CM

## 2023-12-01 DIAGNOSIS — C7951 Secondary malignant neoplasm of bone: Secondary | ICD-10-CM | POA: Insufficient documentation

## 2023-12-01 DIAGNOSIS — Z5111 Encounter for antineoplastic chemotherapy: Secondary | ICD-10-CM | POA: Insufficient documentation

## 2023-12-01 DIAGNOSIS — Z5112 Encounter for antineoplastic immunotherapy: Secondary | ICD-10-CM | POA: Insufficient documentation

## 2023-12-01 DIAGNOSIS — Z79899 Other long term (current) drug therapy: Secondary | ICD-10-CM | POA: Insufficient documentation

## 2023-12-01 LAB — CBC WITH DIFFERENTIAL/PLATELET
Abs Immature Granulocytes: 0.02 10*3/uL (ref 0.00–0.07)
Basophils Absolute: 0.1 10*3/uL (ref 0.0–0.1)
Basophils Relative: 1 %
Eosinophils Absolute: 0.2 10*3/uL (ref 0.0–0.5)
Eosinophils Relative: 2 %
HCT: 32.6 % — ABNORMAL LOW (ref 36.0–46.0)
Hemoglobin: 10.7 g/dL — ABNORMAL LOW (ref 12.0–15.0)
Immature Granulocytes: 0 %
Lymphocytes Relative: 25 %
Lymphs Abs: 1.8 10*3/uL (ref 0.7–4.0)
MCH: 29.2 pg (ref 26.0–34.0)
MCHC: 32.8 g/dL (ref 30.0–36.0)
MCV: 88.8 fL (ref 80.0–100.0)
Monocytes Absolute: 0.8 10*3/uL (ref 0.1–1.0)
Monocytes Relative: 11 %
Neutro Abs: 4.5 10*3/uL (ref 1.7–7.7)
Neutrophils Relative %: 61 %
Platelets: 291 10*3/uL (ref 150–400)
RBC: 3.67 MIL/uL — ABNORMAL LOW (ref 3.87–5.11)
RDW: 13.4 % (ref 11.5–15.5)
WBC: 7.4 10*3/uL (ref 4.0–10.5)
nRBC: 0 % (ref 0.0–0.2)

## 2023-12-01 LAB — COMPREHENSIVE METABOLIC PANEL
ALT: 16 U/L (ref 0–44)
AST: 24 U/L (ref 15–41)
Albumin: 4.2 g/dL (ref 3.5–5.0)
Alkaline Phosphatase: 75 U/L (ref 38–126)
Anion gap: 11 (ref 5–15)
BUN: 14 mg/dL (ref 6–20)
CO2: 24 mmol/L (ref 22–32)
Calcium: 9.2 mg/dL (ref 8.9–10.3)
Chloride: 103 mmol/L (ref 98–111)
Creatinine, Ser: 0.7 mg/dL (ref 0.44–1.00)
GFR, Estimated: >60 mL/min (ref >60)
Glucose, Bld: 113 mg/dL — ABNORMAL HIGH (ref 70–99)
Potassium: 3.6 mmol/L (ref 3.5–5.1)
Sodium: 138 mmol/L (ref 135–145)
Total Bilirubin: 0.4 mg/dL (ref <1.2)
Total Protein: 7.4 g/dL (ref 6.5–8.1)

## 2023-12-01 LAB — VITAMIN B12: Vitamin B-12: 551 pg/mL (ref 180–914)

## 2023-12-01 MED ORDER — TRASTUZUMAB-ANNS CHEMO 150 MG IV SOLR
6.0000 mg/kg | Freq: Once | INTRAVENOUS | Status: AC
  Administered 2023-12-01: 462 mg via INTRAVENOUS
  Filled 2023-12-01: qty 22

## 2023-12-01 MED ORDER — SODIUM CHLORIDE 0.9 % IV SOLN
Freq: Once | INTRAVENOUS | Status: AC
  Filled 2023-12-01: qty 250

## 2023-12-01 MED ORDER — FULVESTRANT 250 MG/5ML IM SOSY
500.0000 mg | PREFILLED_SYRINGE | INTRAMUSCULAR | Status: DC
  Administered 2023-12-01: 500 mg via INTRAMUSCULAR
  Filled 2023-12-01: qty 10

## 2023-12-01 MED ORDER — SODIUM CHLORIDE 0.9 % IV SOLN
420.0000 mg | Freq: Once | INTRAVENOUS | Status: AC
  Administered 2023-12-01: 420 mg via INTRAVENOUS
  Filled 2023-12-01: qty 14

## 2023-12-01 MED ORDER — HEPARIN SOD (PORK) LOCK FLUSH 100 UNIT/ML IV SOLN
500.0000 [IU] | Freq: Once | INTRAVENOUS | Status: AC | PRN
Start: 2023-12-01 — End: 2023-12-01
  Administered 2023-12-01: 500 [IU]
  Filled 2023-12-01: qty 5

## 2023-12-01 MED ORDER — ZOLEDRONIC ACID 4 MG/100ML IV SOLN
4.0000 mg | Freq: Once | INTRAVENOUS | Status: AC
Start: 2023-12-01 — End: 2023-12-01
  Administered 2023-12-01: 4 mg via INTRAVENOUS
  Filled 2023-12-01: qty 100

## 2023-12-01 NOTE — Progress Notes (Signed)
Per Dr Cathie Hoops,  No reload on trastuzumab. Continue maintenance dose of 6 mg/kg   Sharen Hones, PharmD, BCPS Clinical Pharmacist

## 2023-12-01 NOTE — Patient Instructions (Signed)
 [facility] - A DEPT OF [facility]  Discharge Instructions: Thank you for choosing [facility] to provide your oncology and hematology care.  If you have a lab appointment with the Cancer Center, please go directly to the Cancer Center and check in at the registration area.  Wear comfortable clothing and clothing appropriate for easy access to any Portacath or PICC line.   We strive to give you quality time with your provider. You may need to reschedule your appointment if you arrive late (15 or more minutes).  Arriving late affects you and other patients whose appointments are after yours.  Also, if you miss three or more appointments without notifying the office, you may be dismissed from the clinic at the provider's discretion.      For prescription refill requests, have your pharmacy contact our office and allow 72 hours for refills to be completed.    Today you received the following chemotherapy and/or immunotherapy agents kanjinti and perjeta      To help prevent nausea and vomiting after your treatment, we encourage you to take your nausea medication as directed.  BELOW ARE SYMPTOMS THAT SHOULD BE REPORTED IMMEDIATELY: *FEVER GREATER THAN 100.4 F (38 C) OR HIGHER *CHILLS OR SWEATING *NAUSEA AND VOMITING THAT IS NOT CONTROLLED WITH YOUR NAUSEA MEDICATION *UNUSUAL SHORTNESS OF BREATH *UNUSUAL BRUISING OR BLEEDING *URINARY PROBLEMS (pain or burning when urinating, or frequent urination) *BOWEL PROBLEMS (unusual diarrhea, constipation, pain near the anus) TENDERNESS IN MOUTH AND THROAT WITH OR WITHOUT PRESENCE OF ULCERS (sore throat, sores in mouth, or a toothache) UNUSUAL RASH, SWELLING OR PAIN  UNUSUAL VAGINAL DISCHARGE OR ITCHING   Items with * indicate a potential emergency and should be followed up as soon as possible or go to the Emergency Department if any problems should occur.  Please show the CHEMOTHERAPY ALERT CARD or  IMMUNOTHERAPY ALERT CARD at check-in to the Emergency Department and triage nurse.  Should you have questions after your visit or need to cancel or reschedule your appointment, please contact [facility] - A DEPT OF [facility]  [phone] and follow the prompts.  Office hours are 8:00 a.m. to 4:30 p.m. Monday - Friday. Please note that voicemails left after 4:00 p.m. may not be returned until the following business day.  We are closed weekends and major holidays. You have access to a nurse at all times for urgent questions. Please call the main number to the clinic [phone] and follow the prompts.  For any non-urgent questions, you may also contact your provider using MyChart. We now offer e-Visits for anyone 58 and older to request care online for non-urgent symptoms. For details visit [email or web address].   Also download the MyChart app! Go to the app store, search "MyChart", open the app, select , and log in with your MyChart username and password.

## 2023-12-13 ENCOUNTER — Other Ambulatory Visit: Payer: Self-pay

## 2023-12-15 ENCOUNTER — Ambulatory Visit: Payer: Medicare HMO | Admitting: Oncology

## 2023-12-15 ENCOUNTER — Ambulatory Visit: Payer: Medicare HMO

## 2023-12-15 ENCOUNTER — Other Ambulatory Visit: Payer: Medicare HMO

## 2023-12-22 ENCOUNTER — Inpatient Hospital Stay (HOSPITAL_BASED_OUTPATIENT_CLINIC_OR_DEPARTMENT_OTHER): Payer: Medicare HMO | Attending: Oncology | Admitting: Oncology

## 2023-12-22 ENCOUNTER — Encounter: Payer: Self-pay | Admitting: Oncology

## 2023-12-22 ENCOUNTER — Inpatient Hospital Stay: Payer: Medicare HMO | Attending: Oncology

## 2023-12-22 ENCOUNTER — Inpatient Hospital Stay: Payer: Medicare HMO

## 2023-12-22 VITALS — BP 98/80 | HR 112 | Temp 98.3°F | Resp 18 | Wt 183.0 lb

## 2023-12-22 VITALS — HR 109

## 2023-12-22 DIAGNOSIS — C7951 Secondary malignant neoplasm of bone: Secondary | ICD-10-CM

## 2023-12-22 DIAGNOSIS — Z170 Estrogen receptor positive status [ER+]: Secondary | ICD-10-CM | POA: Diagnosis not present

## 2023-12-22 DIAGNOSIS — T451X5A Adverse effect of antineoplastic and immunosuppressive drugs, initial encounter: Secondary | ICD-10-CM

## 2023-12-22 DIAGNOSIS — I428 Other cardiomyopathies: Secondary | ICD-10-CM

## 2023-12-22 DIAGNOSIS — Z5112 Encounter for antineoplastic immunotherapy: Secondary | ICD-10-CM | POA: Diagnosis not present

## 2023-12-22 DIAGNOSIS — M79604 Pain in right leg: Secondary | ICD-10-CM

## 2023-12-22 DIAGNOSIS — Z5111 Encounter for antineoplastic chemotherapy: Secondary | ICD-10-CM | POA: Diagnosis present

## 2023-12-22 DIAGNOSIS — C50411 Malignant neoplasm of upper-outer quadrant of right female breast: Secondary | ICD-10-CM | POA: Diagnosis not present

## 2023-12-22 DIAGNOSIS — C50811 Malignant neoplasm of overlapping sites of right female breast: Secondary | ICD-10-CM | POA: Diagnosis not present

## 2023-12-22 DIAGNOSIS — G620 Drug-induced polyneuropathy: Secondary | ICD-10-CM

## 2023-12-22 DIAGNOSIS — E876 Hypokalemia: Secondary | ICD-10-CM

## 2023-12-22 DIAGNOSIS — M545 Low back pain, unspecified: Secondary | ICD-10-CM

## 2023-12-22 LAB — COMPREHENSIVE METABOLIC PANEL
ALT: 18 U/L (ref 0–44)
AST: 28 U/L (ref 15–41)
Albumin: 4.2 g/dL (ref 3.5–5.0)
Alkaline Phosphatase: 71 U/L (ref 38–126)
Anion gap: 10 (ref 5–15)
BUN: 17 mg/dL (ref 6–20)
CO2: 24 mmol/L (ref 22–32)
Calcium: 8.6 mg/dL — ABNORMAL LOW (ref 8.9–10.3)
Chloride: 105 mmol/L (ref 98–111)
Creatinine, Ser: 0.67 mg/dL (ref 0.44–1.00)
GFR, Estimated: >60 mL/min (ref >60)
Glucose, Bld: 130 mg/dL — ABNORMAL HIGH (ref 70–99)
Potassium: 3.5 mmol/L (ref 3.5–5.1)
Sodium: 139 mmol/L (ref 135–145)
Total Bilirubin: 0.4 mg/dL (ref 0.0–1.2)
Total Protein: 7.5 g/dL (ref 6.5–8.1)

## 2023-12-22 LAB — CBC WITH DIFFERENTIAL/PLATELET
Abs Immature Granulocytes: 0.04 10*3/uL (ref 0.00–0.07)
Basophils Absolute: 0.1 10*3/uL (ref 0.0–0.1)
Basophils Relative: 1 %
Eosinophils Absolute: 0.2 10*3/uL (ref 0.0–0.5)
Eosinophils Relative: 2 %
HCT: 32.8 % — ABNORMAL LOW (ref 36.0–46.0)
Hemoglobin: 10.7 g/dL — ABNORMAL LOW (ref 12.0–15.0)
Immature Granulocytes: 0 %
Lymphocytes Relative: 18 %
Lymphs Abs: 1.7 10*3/uL (ref 0.7–4.0)
MCH: 29.1 pg (ref 26.0–34.0)
MCHC: 32.6 g/dL (ref 30.0–36.0)
MCV: 89.1 fL (ref 80.0–100.0)
Monocytes Absolute: 0.7 10*3/uL (ref 0.1–1.0)
Monocytes Relative: 8 %
Neutro Abs: 6.7 10*3/uL (ref 1.7–7.7)
Neutrophils Relative %: 71 %
Platelets: 295 10*3/uL (ref 150–400)
RBC: 3.68 MIL/uL — ABNORMAL LOW (ref 3.87–5.11)
RDW: 13.3 % (ref 11.5–15.5)
WBC: 9.4 10*3/uL (ref 4.0–10.5)
nRBC: 0 % (ref 0.0–0.2)

## 2023-12-22 MED ORDER — HEPARIN SOD (PORK) LOCK FLUSH 100 UNIT/ML IV SOLN
500.0000 [IU] | Freq: Once | INTRAVENOUS | Status: AC | PRN
  Administered 2023-12-22: 500 [IU]
  Filled 2023-12-22: qty 5

## 2023-12-22 MED ORDER — TRASTUZUMAB-ANNS CHEMO 150 MG IV SOLR
6.0000 mg/kg | Freq: Once | INTRAVENOUS | Status: AC
  Administered 2023-12-22: 462 mg via INTRAVENOUS
  Filled 2023-12-22: qty 22

## 2023-12-22 MED ORDER — SODIUM CHLORIDE 0.9 % IV SOLN
420.0000 mg | Freq: Once | INTRAVENOUS | Status: AC
  Administered 2023-12-22: 420 mg via INTRAVENOUS
  Filled 2023-12-22: qty 14

## 2023-12-22 MED ORDER — SODIUM CHLORIDE 0.9 % IV SOLN
Freq: Once | INTRAVENOUS | Status: AC
  Filled 2023-12-22: qty 250

## 2023-12-22 NOTE — Assessment & Plan Note (Signed)
She does not tolerate oral potassium.  Potassium is stable. 

## 2023-12-22 NOTE — Patient Instructions (Signed)

## 2023-12-22 NOTE — Patient Instructions (Signed)
 [facility] - A DEPT OF [facility]  Discharge Instructions: Thank you for choosing [facility] to provide your oncology and hematology care.  If you have a lab appointment with the Cancer Center, please go directly to the Cancer Center and check in at the registration area.  Wear comfortable clothing and clothing appropriate for easy access to any Portacath or PICC line.   We strive to give you quality time with your provider. You may need to reschedule your appointment if you arrive late (15 or more minutes).  Arriving late affects you and other patients whose appointments are after yours.  Also, if you miss three or more appointments without notifying the office, you may be dismissed from the clinic at the provider's discretion.      For prescription refill requests, have your pharmacy contact our office and allow 72 hours for refills to be completed.    Today you received the following chemotherapy and/or immunotherapy agents kanjinti and perjeta      To help prevent nausea and vomiting after your treatment, we encourage you to take your nausea medication as directed.  BELOW ARE SYMPTOMS THAT SHOULD BE REPORTED IMMEDIATELY: *FEVER GREATER THAN 100.4 F (38 C) OR HIGHER *CHILLS OR SWEATING *NAUSEA AND VOMITING THAT IS NOT CONTROLLED WITH YOUR NAUSEA MEDICATION *UNUSUAL SHORTNESS OF BREATH *UNUSUAL BRUISING OR BLEEDING *URINARY PROBLEMS (pain or burning when urinating, or frequent urination) *BOWEL PROBLEMS (unusual diarrhea, constipation, pain near the anus) TENDERNESS IN MOUTH AND THROAT WITH OR WITHOUT PRESENCE OF ULCERS (sore throat, sores in mouth, or a toothache) UNUSUAL RASH, SWELLING OR PAIN  UNUSUAL VAGINAL DISCHARGE OR ITCHING   Items with * indicate a potential emergency and should be followed up as soon as possible or go to the Emergency Department if any problems should occur.  Please show the CHEMOTHERAPY ALERT CARD or  IMMUNOTHERAPY ALERT CARD at check-in to the Emergency Department and triage nurse.  Should you have questions after your visit or need to cancel or reschedule your appointment, please contact [facility] - A DEPT OF [facility]  [phone] and follow the prompts.  Office hours are 8:00 a.m. to 4:30 p.m. Monday - Friday. Please note that voicemails left after 4:00 p.m. may not be returned until the following business day.  We are closed weekends and major holidays. You have access to a nurse at all times for urgent questions. Please call the main number to the clinic [phone] and follow the prompts.  For any non-urgent questions, you may also contact your provider using MyChart. We now offer e-Visits for anyone 58 and older to request care online for non-urgent symptoms. For details visit [email or web address].   Also download the MyChart app! Go to the app store, search "MyChart", open the app, select , and log in with your MyChart username and password.

## 2023-12-22 NOTE — Assessment & Plan Note (Signed)
Chronic tachycardia Stable low-normal LVEF 50-55%. Follow up with cardiology. She gets ECHO Q3 months. 

## 2023-12-22 NOTE — Assessment & Plan Note (Signed)
continue Lyrica and nortriptyline symptoms stable 

## 2023-12-22 NOTE — Progress Notes (Signed)
 Hematology/Oncology Progress note [phone] [phone]    REASON FOR VISIT Follow up for  treatment of breast cancer   ASSESSMENT & PLAN:   Cancer Staging  Breast cancer of upper-outer quadrant of right female breast (HCC) Staging form: Breast, AJCC 8th Edition - Clinical: G2, ER+, PR+, HER2- - Signed by Babara Call, MD on 04/05/2018 - Pathologic stage from 04/04/2018: No Stage Recommended (ypT3, pN2, cM0, G3, ER+, PR-, HER2-) - Signed by Babara Call, MD on 04/04/2018 - Pathologic: Stage IV (rpTX, pNX, cM1, ER+, PR-, HER2+) - Signed by Babara Call, MD on 12/10/2020   Breast cancer of upper-outer quadrant of right female breast (HCC) Recurrent breast cancer with isolated bone metastasis, stage IV #Initially stage IIIA right breast cancer, ER positive, HER-2 negative status post bilateral oophorectomy,Right mastectomy and right axillary lymph node dissection, status post implant, and implant removal.  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 ER positive, HER-2 positive  .- s/p Palliative radiation to T7  Labs reviewed and discussed Proceed with Kanjinti  Perjecta.   Continue fulvestrant  monthly. 10/20/23 PET NED, discussed with patient.  10/17/23 normal bone density on DEXA  Encounter for monoclonal antibody treatment for malignancy Antibody treatment plan as list above.  Hypocalcemia Continue calcium  supplementation 600mg  TID.    Hypokalemia She does not tolerate oral potassium.  Potassium is stable.  Malignant neoplasm metastatic to bone [facility]) status post radiation. Continue Zometa  monthly, hold if Calcium  is <8.6  - hold due to recent bone fracture/surgery - Continue calcium  supplementation. Continue oxycodone  as needed.    Neuropathy due to chemotherapeutic drug (HCC) continue Lyrica  and nortriptyline symptoms stable  Nonischemic cardiomyopathy (HCC) Chronic  tachycardia Stable low-normal LVEF 50-55%. Follow up with cardiology. She gets ECHO Q3 months.  Low back pain radiating to right lower extremity Possible SI joint arthritis, other differential includes radiculopathy, bone mets.  No bone lesions on Nov 2024 PET.  Shared decision was made to observe for now. She plans to get chiropractor evaluation.   Patient declines influenza vaccination.   Orders Placed This Encounter  Procedures   Comprehensive metabolic panel    Standing Status:   Future    Expected Date:   02/02/2024    Expiration Date:   02/01/2025   CBC with Differential    Standing Status:   Future    Expected Date:   02/02/2024    Expiration Date:   02/01/2025   Ambulatory referral to Social Work    Referral Priority:   Routine    Referral Type:   Consultation    Referral Reason:   Specialty Services Required    Number of Visits Requested:   1   Follow up  lab Kanjinti  Perjecta 3 weeks.  lab MD Kanjinti  Perjecta 6 weeks.  fulvestrant  Q 4 weeks   All questions were answered. The patient knows to call the clinic with any problems, questions or concerns.  Call Babara, MD, PhD [facility] Hematology Oncology 12/22/2023       Oncology History  Oncology History Overview Note        Malignant neoplasm of overlapping sites of right breast in female, estrogen receptor positive (HCC)  Breast cancer of upper-outer quadrant of right female breast (HCC)  10/19/2017 Initial Diagnosis   Malignant neoplasm of overlapping sites of right breast in female, estrogen receptor positive (HCC)   11/08/2017 - 01/03/2018 Chemotherapy   Neoadjuvant ddAC x 4 + 1 cycle of Taxol   due to lack of response, surgery was offered.   03/19/2018 Surgery   S/p right mastectomy and right axillary dissection, immediate breast reconstruction with placement of expanders.  ypT3 ypN2, + lymphovascular invasion,  Grade 3, margin is negative, close. ER 90%, PR 0%, HER2 IHC negative.   # elective bilateral  salpingo-oophorectomy..    06/11/2018 Imaging   s/p 11 cycles Taxol  adjuvantly   10/10/2018 -  Radiation Therapy   adjuvant right chest wall radiation   06/03/2019 Surgery   underwent elective left prophylactic mastectomy and sentinel lymph node biopsy of left axilla Pathology negative for malignancy  # Mediport removal  # right implant removal on    07/07/2020 Imaging   MRI thoracic spine without contrast showed lesions involving the T7 posterior elements most concerning for metastatic lesion.  No evidence of epidural tumor.  Minimal thoracic spondylosis without stenosis. MRI was reviewed by me and a PET scan was obtained for further evaluation   07/20/2020 Imaging   PET scan showed hypermetabolic metastasis involving the posterior element of T7, no additional evidence of metastasis in the neck, chest, abdomen or pelvis.   07/29/2020 Procedure   T7 lesion biopsy showed metastatic carcinoma, compatible with breast origin.  Receptor status staining showed ER 71-80% positive, PR negative, HER-2 positive IHC 3+   08/31/2020 -  Radiation Therapy    finished spine radiation    09/17/2020 -  Chemotherapy   Patient is on Treatment Plan :  BREAST Trastuzumab  + Pertuzumab  q21d     12/10/2020 Cancer Staging   Staging form: Breast, AJCC 8th Edition - Pathologic: Stage IV (rpTX, pNX, cM1, ER+, PR-, HER2+) - Signed by Babara Call, MD on 12/10/2020   03/15/2021 Imaging    PET scan showed no focal hypermetabolic activity to suggest skeletal metastasis.  Mild hypermetabolic activity along the right T7-8 paraspinal musculature.  Max SUV 3.3.  Likely postprocedural-   09/28/2021 Echocardiogram   further decrease of LVEF to 45%   10/14/2021 Imaging   PET  Post bilateral mastectomy and RIGHT axillary dissection without signs of recurrent or metastatic disease.   11/12/2021 Echocardiogram   LVEF of 45-50%.   02/18/2022 Imaging   MRI thoracic and lumbar spine w wo contrast  1. Negative MRIs of the  thoracic and lumbar spine. No evidence for locally recurrent metastasis at the level of T7. No other new metastatic disease elsewhere with thoracolumbar spine. 2. No significant disc pathology, stenosis, or evidence for neural impingement.    02/25/2022 Echocardiogram   LVEF 55-60%.   03/16/2022 Imaging   PET showed Stable PET-CT. No findings for local recurrent breast cancer,locoregional adenopathy or distant metastatic disease.    07/20/2022 Imaging   MRI Thoracic Spine w wo contrast 1. Interval resolution of the increased T2 signal contrast enhancement in the posterior elements of T7. No new lesion identified. 2. No spinal canal or neural foraminal stenosis.   08/23/2022 Echocardiogram   Stable low-normal LVEF at 50-55%.   10/05/2022 Imaging   PET restaging Stable examination without evidence of hypermetabolic recurrence or metastatic disease.   Persistent minimally metabolic stranding in the posterior bilateral gluteal soft tissues may reflect sequela of subcutaneous injections   02/24/2023 Echocardiogram   Echocardiogram showed stable low normal LVEF 50-55%.   04/07/2023 Imaging   PET scan  1. Status post bilateral mastectomies without evidence of hypermetabolic local recurrence. 2. No evidence of hypermetabolic metastatic disease. 3. Asymmetric right-sided tonsillar FDG avidity is nonspecific consider further evaluation with direct visualization. 4. Gas  fluid levels in the proximal colon may reflect a diarrheal illness or laxative use.      INTERVAL HISTORY 43 yo female with above oncology history reviewed by me presents for follow-up of management of metastatic Triple positive breast cancer.  Chronic back pain, unchanged.  Neck/upper back pain for 1 week Intermittent diarrhea, manageable.  No fever or chills. + arthralgia + right lower back pain since Nov/Dec, sometimes radiate to right lower extremity.     Review of Systems  Constitutional:  Negative for chills,  fever, malaise/fatigue and weight loss.  HENT:  Negative for sore throat.        Hair loss  Eyes:  Negative for redness.  Respiratory:  Negative for cough, shortness of breath and wheezing.   Cardiovascular:  Negative for chest pain, palpitations and leg swelling.  Gastrointestinal:  Negative for abdominal pain, blood in stool, heartburn, nausea and vomiting.  Genitourinary:  Negative for dysuria.  Musculoskeletal:  Positive for back pain and joint pain.  Skin:  Positive for rash.  Neurological:  Positive for tingling. Negative for dizziness and tremors.  Endo/Heme/Allergies:  Does not bruise/bleed easily.  Psychiatric/Behavioral:  Negative for hallucinations. The patient does not have insomnia.     No Known Allergies  Patient Active Problem List   Diagnosis Date Noted   Breast cancer of upper-outer quadrant of right female breast (HCC) 03/19/2018    Priority: High   Normocytic anemia 06/23/2023    Priority: Medium    Hypokalemia 06/03/2022    Priority: Medium    Malignant neoplasm metastatic to bone (HCC) 03/25/2021    Priority: Medium    Encounter for monoclonal antibody treatment for malignancy 12/10/2020    Priority: Medium    Hypocalcemia 11/19/2020    Priority: Medium    Neuropathy due to chemotherapeutic drug (HCC) 08/11/2020    Priority: Medium    Rheumatoid arthritis (HCC) 10/13/2019    Priority: Medium    Nonischemic cardiomyopathy (HCC) 08/23/2018    Priority: Medium    Folliculitis and perifolliculitis 04/14/2023    Priority: Low   Abnormal bowel movement 04/14/2023    Priority: Low   Right shoulder pain 01/20/2023    Priority: Low   Thyroid  nodule 06/04/2022    Priority: Low   Encounter for antineoplastic chemotherapy 10/29/2020    Priority: Low   Goals of care, counseling/discussion 08/11/2020    Priority: Low   Gastroesophageal reflux disease without esophagitis 02/24/2017    Priority: Low   Malignant tumor of breast (HCC) 07/11/2023   Stress  fracture of metatarsal bone of right foot 07/11/2023   Change in bowel habits 06/23/2023   Closed fracture of fifth metatarsal bone 05/09/2023   Tachycardia, unspecified 04/15/2021   Bone lesion 11/19/2020   Inflammatory arthritis 11/19/2020   HER2-positive carcinoma of breast (HCC) 08/11/2020   Bilateral hand swelling 10/03/2019   Fracture of neck of metacarpal bone 05/14/2019   Chronic fatigue 04/09/2019   Polyarthralgia 04/09/2019   Status post right breast reconstruction 02/26/2019   Status post right mastectomy 02/26/2019   Mastalgia 02/15/2019   Shortness of breath 08/23/2018   Preprocedural cardiovascular examination 08/23/2018   Tachycardia 08/23/2018   Palpitations 08/23/2018   Estrogen receptor positive status (ER+) 04/04/2018   Acquired absence of right breast and nipple 04/03/2018   Family history of cancer    Malignant neoplasm of overlapping sites of right breast in female, estrogen receptor positive (HCC) 10/19/2017   Generalized anxiety disorder 10/03/2014   Headache 10/03/2014  Past Medical History:  Diagnosis Date   Anemia    Arthritis    BRCA negative 11/26/2017   Breast cancer (HCC) 10/11/2017   Multifocal, ER positive, PR negative, HER-2 negative. ypT3 ypN2a 8.7 cm, 4/15 nodes   Cardiomyopathy (HCC)    a. 10/2017 Echo: EF 60-65%, no rwma, Gr1 DD, nl RV size/fxn; b. 04/2018 Echo: EF 55-60%, no rwma, Nl RV size/fxn; c. 08/2018 Echo: EF 45%, diff HK, ? HK of antsept wall. Gr1 DD. Mild MR. Mild LAE/RAE. Mod dil RV.    Chronic bronchitis (HCC) 11/2017   COPD (chronic obstructive pulmonary disease) (HCC)    MILD PER CXR   Depression    Family history of cancer    GERD (gastroesophageal reflux disease)    Headache    MIGRAINES   Heart murmur    ASYMPTOMATIC   Personal history of chemotherapy    current for right breast ca     Past Surgical History:  Procedure Laterality Date   AXILLARY LYMPH NODE DISSECTION Right 03/19/2018   Procedure: AXILLARY  LYMPH NODE DISSECTION;  Surgeon: Dessa Reyes ORN, MD;  Location: [facility] ORS;  Service: General;  Laterality: Right;   BIOPSY  06/23/2023   Procedure: BIOPSY;  Surgeon: Therisa Bi, MD;  Location[facility] ENDOSCOPY;  Service: Gastroenterology;;   BREAST BIOPSY Right 10/11/2017   12:30 posterior coil clip invasive mammary carcinoma   BREAST BIOPSY Right 10/11/2017   11:30 middle depth ribbon clip DCIS   BREAST BIOPSY Right 10/11/2017   5:30 anterior depth x shape invasive ductal carcinoma   BREAST IMPLANT REMOVAL Right 06/03/2019   Procedure: REMOVAL OF RIGHT BREAST IMPLANTS;  Surgeon: Lowery Estefana RAMAN, DO;  Location: [facility] ORS;  Service: Plastics;  Laterality: Right;   BREAST RECONSTRUCTION WITH PLACEMENT OF TISSUE EXPANDER AND FLEX HD (ACELLULAR HYDRATED DERMIS) Right 03/19/2018   Procedure: BREAST RECONSTRUCTION WITH PLACEMENT OF TISSUE EXPANDER AND FLEX HD (ACELLULAR HYDRATED DERMIS);  Surgeon: Lowery Estefana RAMAN, DO;  Location: [facility] ORS;  Service: Plastics;  Laterality: Right;   CARPAL TUNNEL RELEASE Bilateral 2020   CHOLECYSTECTOMY N/A 04/27/2020   Procedure: LAPAROSCOPIC CHOLECYSTECTOMY WITH INTRAOPERATIVE CHOLANGIOGRAM;  Surgeon: Dessa Reyes ORN, MD;  Location: [facility]S;  Service: General;  Laterality: N/A;   COLONOSCOPY WITH PROPOFOL  N/A 06/23/2023   Procedure: COLONOSCOPY WITH PROPOFOL ;  Surgeon: Therisa Bi, MD;  Location: [facility] ENDOSCOPY;  Service: Gastroenterology;  Laterality: N/A;   ESOPHAGOGASTRODUODENOSCOPY (EGD) WITH PROPOFOL  N/A 04/17/2020   Procedure: ESOPHAGOGASTRODUODENOSCOPY (EGD) WITH PROPOFOL ;  Surgeon: Dessa Reyes ORN, MD;  Location: [facility] ENDOSCOPY;  Service: Endoscopy;  Laterality: N/A;  with biopsy   ESOPHAGOGASTRODUODENOSCOPY (EGD) WITH PROPOFOL  N/A 06/23/2023   Procedure: ESOPHAGOGASTRODUODENOSCOPY (EGD) WITH PROPOFOL ;  Surgeon: Therisa Bi, MD;  Location[facility] ENDOSCOPY;  Service: Gastroenterology;  Laterality: N/A;   LAPAROSCOPIC BILATERAL SALPINGO OOPHERECTOMY  Bilateral 03/19/2018   Procedure: LAPAROSCOPIC BILATERAL SALPINGO OOPHORECTOMY;  Surgeon: Verdon Keen, MD;  Location: [facility] ORS;  Service: Gynecology;  Laterality: Bilateral;   MASTECTOMY Right 03/2018   MASTECTOMY W/ SENTINEL NODE BIOPSY Right 03/19/2018   Procedure: MASTECTOMY WITH SENTINEL LYMPH NODE BIOPSY;  Surgeon: Dessa Reyes ORN, MD;  Location: [facility] ORS;  Service: General;  Laterality: Right;   PORT-A-CATH REMOVAL Left 06/03/2019   Procedure: REMOVAL PORT-A-CATH;  Surgeon: Dessa Reyes ORN, MD;  Location: [facility];  Service: General;  Laterality: Left;   PORTACATH PLACEMENT Left 10/24/2017   Procedure: INSERTION PORT-A-CATH;  Surgeon: Dessa Reyes ORN, MD;  Location: [facility];  Service: General;  Laterality: Left;  PORTACATH PLACEMENT Right 09/28/2020   Procedure: INSERTION PORT-A-CATH;  Surgeon: Dessa Reyes ORN, MD;  Location: [facility];  Service: General;  Laterality: Right;   REMOVAL OF TISSUE EXPANDER AND PLACEMENT OF IMPLANT Right 07/20/2018   Procedure: REMOVAL OF RIGHT BREAST TISSUE EXPANDER AND PLACEMENT OF IMPLANT;  Surgeon: Lowery Estefana RAMAN, DO;  Location: [facility];  Service: Plastics;  Laterality: Right;   SIMPLE MASTECTOMY WITH AXILLARY SENTINEL NODE BIOPSY Left 06/03/2019   Procedure: SIMPLE MASTECTOMY LEFT;  Surgeon: Dessa Reyes ORN, MD;  Location: [facility];  Service: General;  Laterality: Left;    Social History   Socioeconomic History   Marital status: Married    Spouse name: Not on file   Number of children: Not on file   Years of education: Not on file   Highest education level: Not on file  Occupational History   Occupation: pharmacy tech    Comment: Acupuncturist [employer]   Tobacco Use   Smoking status: Former    Current packs/day: 0.00    Average packs/day: 0.5 packs/day for 18.0 years (9.0 ttl pk-yrs)    Types: Cigarettes    Start date: 06/21/2018    Quit date: 12/11/2022    Years since quitting: 1.0    Smokeless tobacco: Never  Vaping Use   Vaping status: Never Used  Substance and Sexual Activity   Alcohol use: No   Drug use: No   Sexual activity: Yes    Birth control/protection: Injection, Other-see comments    Comment: has had hysterectomy  Other Topics Concern   Not on file  Social History Narrative   Lives at home with husband and daughter   Social Drivers of Corporate Investment Banker Strain: Not on file  Food Insecurity: Not on file  Transportation Needs: Not on file  Physical Activity: Not on file  Stress: Not on file  Social Connections: Not on file  Intimate Partner Violence: Not on file     Family History  Problem Relation Age of Onset   Diabetes Father    Hypertension Father    Hyperlipidemia Father    Heart attack Father 62       mild   Melanoma Maternal Aunt        other aunts with BCC/SCC/Melanoma   Cervical cancer Maternal Aunt 41       daughter w/ cervical cancer as well   Lung cancer Maternal Aunt    Melanoma Maternal Uncle        other uncles with BCC/SCC/Melanoma   Breast cancer Paternal Aunt    Bladder Cancer Maternal Grandmother   Biological mother had Grave's disease.    Current Outpatient Medications:    acetaminophen  (TYLENOL ) 500 MG tablet, Take 500 mg by mouth every 6 (six) hours as needed., Disp: , Rfl:    albuterol  (VENTOLIN  HFA) 108 (90 Base) MCG/ACT inhaler, Inhale 2 puffs into the lungs every 6 (six) hours as needed for wheezing or shortness of breath. , Disp: , Rfl:    CALCIUM  CARBONATE-VITAMIN D  PO, Take 600-800 mg by mouth in the morning, at noon, and at bedtime., Disp: , Rfl:    chlorhexidine  (PERIDEX ) 0.12 % solution, Use as directed 15 mLs in the mouth or throat 2 (two) times daily., Disp: 120 mL, Rfl: 0   cyanocobalamin  (,VITAMIN B-12,) 1000 MCG/ML injection, Inject 1,000 mcg into the skin every 30 (thirty) days., Disp: , Rfl:    diphenoxylate -atropine  (LOMOTIL ) 2.5-0.025 MG tablet, Take 1 tablet by  mouth 4 (four) times  daily as needed for diarrhea or loose stools., Disp: 60 tablet, Rfl: 0   escitalopram  (LEXAPRO ) 20 MG tablet, Take 20 mg by mouth daily., Disp: , Rfl:    esomeprazole (NEXIUM) 40 MG capsule, Take 40 mg by mouth daily before breakfast. , Disp: , Rfl:    Eszopiclone 3 MG TABS, Take 3 mg by mouth at bedtime as needed (sleep). , Disp: , Rfl:    folic acid  (FOLVITE ) 1 MG tablet, Take 1 mg by mouth daily., Disp: , Rfl:    hydrocortisone 2.5 % cream, , Disp: , Rfl:    hydroxychloroquine (PLAQUENIL) 200 MG tablet, Take 200 mg by mouth daily., Disp: , Rfl:    hydrOXYzine  (ATARAX ) 10 MG tablet, Take 1 tablet (10 mg total) by mouth every 6 (six) hours as needed., Disp: 60 tablet, Rfl: 5   ibuprofen  (ADVIL ) 800 MG tablet, Take 800 mg by mouth every 8 (eight) hours as needed for moderate pain., Disp: , Rfl:    loperamide  (IMODIUM ) 2 MG capsule, Take 1 tablet (2 mg total) by mouth See admin instructions. Take 2 tablets with onset of diarrhea, then take 1 tablet every 2 hours until diarrhea stops. Maximum 8 tablets in 24, Disp: 90 capsule, Rfl: 0   loratadine  (CLARITIN ) 10 MG tablet, Take 10 mg by mouth daily. , Disp: , Rfl:    LORazepam (ATIVAN) 1 MG tablet, Take 1 mg by mouth 3 (three) times daily., Disp: , Rfl:    Magnesium 500 MG TABS, Take 500 mg by mouth 2 (two) times daily., Disp: , Rfl:    methocarbamol  (ROBAXIN ) 500 MG tablet, Take by mouth., Disp: , Rfl:    methotrexate (RHEUMATREX) 2.5 MG tablet, Take 25 mg by mouth every Sunday. 10 tablets once a week, Disp: , Rfl:    metoprolol  tartrate (LOPRESSOR ) 25 MG tablet, Take 0.5 tablets (12.5 mg total) by mouth as needed., Disp: 30 tablet, Rfl: 0   mupirocin  ointment (BACTROBAN ) 2 %, Place 1 application into the nose 2 (two) times daily. Use in each nostril twice daily for five (5) days., Disp: 22 g, Rfl: 5   nortriptyline (PAMELOR) 10 MG capsule, Take 30 mg by mouth at bedtime. , Disp: , Rfl:    oxyCODONE  (OXY IR/ROXICODONE ) 5 MG immediate release tablet,  Take 1 tablet (5 mg total) by mouth every 6 (six) hours as needed for moderate pain (pain score 4-6) or severe pain (pain score 7-10)., Disp: 90 tablet, Rfl: 0   phentermine (ADIPEX-P) 37.5 MG tablet, Take 37.5 mg by mouth daily before breakfast. , Disp: , Rfl:    pregabalin  (LYRICA ) 150 MG capsule, Take 150 mg by mouth 2 (two) times daily., Disp: , Rfl:    promethazine  (PHENERGAN ) 25 MG tablet, Take 1 tablet (25 mg total) by mouth every 8 (eight) hours as needed for nausea or vomiting., Disp: 90 tablet, Rfl: 0   pyridOXINE (VITAMIN B-6) 100 MG tablet, Take 100 mg by mouth daily., Disp: , Rfl:    topiramate (TOPAMAX) 50 MG tablet, Take 50 mg by mouth 2 (two) times daily. , Disp: , Rfl:    vitamin C (ASCORBIC ACID) 500 MG tablet, Take 500 mg by mouth daily., Disp: , Rfl:  No current facility-administered medications for this visit.  Facility-Administered Medications Ordered in Other Visits:    heparin  lock flush 100 unit/mL, 500 Units, Intravenous, Once, Babara Call, MD   sodium chloride  flush (NS) 0.9 % injection 10 mL, 10 mL, Intravenous, PRN,  Babara Call, MD, 10 mL at 11/19/18 1249   sodium chloride  flush (NS) 0.9 % injection 10 mL, 10 mL, Intravenous, Once, Babara Call, MD   Physical exam:  Vitals:   12/22/23 0843  BP: 98/80  Pulse: (!) 112  Resp: 18  Temp: 98.3 F (36.8 C)  TempSrc: Tympanic  SpO2: 100%  Weight: 183 lb (83 kg)  ECOG 1 Physical Exam Constitutional:      Appearance: Normal appearance.  HENT:     Head: Normocephalic and atraumatic.  Eyes:     General: No scleral icterus. Neck:     Vascular: No JVD.  Cardiovascular:     Rate and Rhythm: Normal rate.  Pulmonary:     Effort: Pulmonary effort is normal. No respiratory distress.     Breath sounds: Normal breath sounds.  Abdominal:     General: There is no distension.     Palpations: Abdomen is soft.  Musculoskeletal:     Cervical back: Normal range of motion.     Comments: Right lower extremity with dressing.    Lymphadenopathy:     Cervical: No cervical adenopathy.  Skin:    General: Skin is warm.     Findings: No erythema or rash.  Neurological:     Mental Status: She is alert and oriented to person, place, and time. Mental status is at baseline.     Cranial Nerves: No cranial nerve deficit.     Motor: No abnormal muscle tone.  Psychiatric:        Mood and Affect: Mood and affect normal.        Labs     Latest Ref Rng & Units 12/22/2023    8:17 AM  CMP  Glucose 70 - 99 mg/dL 869   BUN 6 - 20 mg/dL 17   Creatinine 9.55 - 1.00 mg/dL 9.32   Sodium 864 - 854 mmol/L 139   Potassium 3.5 - 5.1 mmol/L 3.5   Chloride 98 - 111 mmol/L 105   CO2 22 - 32 mmol/L 24   Calcium  8.9 - 10.3 mg/dL 8.6   Total Protein 6.5 - 8.1 g/dL 7.5   Total Bilirubin 0.0 - 1.2 mg/dL 0.4   Alkaline Phos 38 - 126 U/L 71   AST 15 - 41 U/L 28   ALT 0 - 44 U/L 18       Latest Ref Rng & Units 12/22/2023    8:17 AM  CBC  WBC 4.0 - 10.5 K/uL 9.4   Hemoglobin 12.0 - 15.0 g/dL 89.2   Hematocrit 63.9 - 46.0 % 32.8   Platelets 150 - 400 K/uL 295    RADIOGRAPHIC STUDIES: I have personally reviewed the radiological images as listed and agreed with the findings in the report. ECHOCARDIOGRAM LIMITED Result Date: 11/28/2023    ECHOCARDIOGRAM LIMITED REPORT   Patient Name:   TUNYA HELD Date of Exam: 11/28/2023 Medical Rec #:  [id number]      Height:       67.0 in Accession #:    [id number]     Weight:       180.1 lb Date of Birth:  21-Aug-1981      BSA:          1.934 m Patient Age:    42 years       BP:           118/78 mmHg Patient Gender: F              HR:  110 bpm. Exam Location:  [place] Procedure: 2D Echo, Cardiac Doppler, Limited Color Doppler and Intracardiac            Opacification Agent Indications:    Z51.11 Encounter for antineoplastic chemotheraphy; I42.80                 Non-ischemic cardiomyopathy  History:        Patient has prior history of Echocardiogram examinations, most                  recent 08/31/2023. Cardiomyopathy, COPD, Arrythmias:Tachycardia,                 Signs/Symptoms:Shortness of Breath and Fatigue; Risk                 Factors:Former Smoker.  Sonographer:    Doyal Point MHA, BS, RDCS Referring Phys: [id number] BERNARDINO CHRISTELLA BRING  Sonographer Comments: Technically challenging study due to limited acoustic windows. Image acquisition challenging due to mastectomy. IMPRESSIONS  1. Left ventricular ejection fraction, by estimation, is 50 to 55%. Left ventricular ejection fraction by PLAX is 50 %. The left ventricle has low normal function. The left ventricle has no regional wall motion abnormalities. The left ventricular internal cavity size was moderately dilated. Left ventricular diastolic parameters are indeterminate.  2. Right ventricular systolic function is normal. The right ventricular size is normal. Tricuspid regurgitation signal is inadequate for assessing PA pressure.  3. The mitral valve is normal in structure. Mild mitral valve regurgitation. No evidence of mitral stenosis.  4. The aortic valve is normal in structure. Aortic valve regurgitation is not visualized. No aortic stenosis is present.  5. The inferior vena cava is normal in size with greater than 50% respiratory variability, suggesting right atrial pressure of 3 mmHg. FINDINGS  Left Ventricle: Left ventricular ejection fraction, by estimation, is 50 to 55%. Left ventricular ejection fraction by PLAX is 50 %. The left ventricle has low normal function. The left ventricle has no regional wall motion abnormalities. The left ventricular internal cavity size was moderately dilated. There is no left ventricular hypertrophy. Left ventricular diastolic parameters are indeterminate. Right Ventricle: The right ventricular size is normal. No increase in right ventricular wall thickness. Right ventricular systolic function is normal. Tricuspid regurgitation signal is inadequate for assessing PA pressure. Left Atrium: Left atrial size  was normal in size. Right Atrium: Right atrial size was normal in size. Pericardium: There is no evidence of pericardial effusion. Mitral Valve: The mitral valve is normal in structure. Mild mitral valve regurgitation. No evidence of mitral valve stenosis. Tricuspid Valve: The tricuspid valve is normal in structure. Tricuspid valve regurgitation is mild . No evidence of tricuspid stenosis. Aortic Valve: The aortic valve is normal in structure. Aortic valve regurgitation is not visualized. No aortic stenosis is present. Pulmonic Valve: The pulmonic valve was normal in structure. Pulmonic valve regurgitation is not visualized. No evidence of pulmonic stenosis. Aorta: The aortic root is normal in size and structure. Venous: The inferior vena cava is normal in size with greater than 50% respiratory variability, suggesting right atrial pressure of 3 mmHg. IAS/Shunts: No atrial level shunt detected by color flow Doppler. LEFT VENTRICLE PLAX 2D LV EF:         Left ventricular ejection fraction by PLAX is 50 %. LVIDd:         5.10 cm LVIDs:         3.80 cm LV PW:         0.70  cm LV IVS:        0.70 cm  LV Volumes (MOD) LV vol d, MOD A2C: 142.0 ml LV vol d, MOD A4C: 119.0 ml LV vol s, MOD A2C: 62.9 ml LV vol s, MOD A4C: 59.7 ml LV SV MOD A2C:     79.1 ml LV SV MOD A4C:     119.0 ml LV SV MOD BP:      76.5 ml Evalene Lunger MD Electronically signed by Evalene Lunger MD Signature Date/Time: 11/28/2023/1:47:36 PM    Final    NM PET Image Restag (PS) Skull Base To Thigh Result Date: 10/20/2023 CLINICAL DATA:  Subsequent treatment strategy for breast cancer. EXAM: NUCLEAR MEDICINE PET SKULL BASE TO THIGH TECHNIQUE: 9.92 mCi F-18 FDG was injected intravenously. Full-ring PET imaging was performed from the skull base to thigh after the radiotracer. CT data was obtained and used for attenuation correction and anatomic localization. Fasting blood glucose: 89 mg/dl COMPARISON:  Multiple prior PET CTs.  The most recent is 04/07/2023  FINDINGS: Mediastinal blood pool activity: SUV max 2.19 Liver activity: SUV max NA NECK: No hypermetabolic lymph nodes in the neck. Incidental CT findings: None. CHEST: Surgical changes from bilateral mastectomies. No findings for recurrent chest wall tumor, supraclavicular axillary adenopathy. No enlarged or hypermetabolic mediastinal or hilar lymph nodes. No worrisome pulmonary nodules on the chest CT to suggest pulmonary metastatic disease. No pleural effusions or pleural lesions. Incidental CT findings: The Port-A-Cath is stable. ABDOMEN/PELVIS: No abnormal hypermetabolic activity within the liver, pancreas, adrenal glands, or spleen. No hypermetabolic lymph nodes in the abdomen or pelvis. Incidental CT findings: Status post cholecystectomy. No biliary dilatation. SKELETON: No focal hypermetabolic activity to suggest skeletal metastasis. Incidental CT findings: No worrisome lytic or sclerotic bone lesion on the CT scan. IMPRESSION: 1. Surgical changes from bilateral mastectomies. No findings for recurrent chest wall tumor, supraclavicular or axillary adenopathy. 2. No findings for metastatic disease involving the chest, abdomen/pelvis or bony structures. Electronically Signed   By: MYRTIS Stammer M.D.   On: 10/20/2023 14:30   DG Bone Density Result Date: 10/17/2023 EXAM: DUAL X-RAY ABSORPTIOMETRY (DXA) FOR BONE MINERAL DENSITY IMPRESSION: Your patient Shawneen Deetz completed a BMD test on 10/17/2023 using the [employer] Advance DXA System (analysis version: 14.10) manufactured by [employer]. The following summarizes the results of our evaluation. Technologist: LCE PATIENT BIOGRAPHICAL: Name: Jadelyn, Elks Patient ID: [id number] Birth Date: 13-Feb-1981 Height: 67.0 in. Gender: Female Exam Date: 10/17/2023 Weight: 178.2 lbs. Indications: Caucasian, Early Menopause, High Risk Medications, History of Fracture (Adult), History of Metastatic Breast Cancer, Hysterectomy, Oophorectomy Bilateral, POSTmenopausal,  Rheumatoid Arthritis Fractures: Foot Right Treatments: Calcium , Vitamin D , Zometa  ASSESSMENT: The BMD measured at Femur Neck Right is 0.910 g/cm2 with a T-score of -0.9. This patient is considered normal according to World Health Organization [facility]) criteria. L-1 and L-2 were excluded due to degenerative changes/compression fracture, etc. The scan quality is good. Site Region Measured Measured WHO Young Adult BMD Date       Age      Classification T-score AP Spine L3-L4 10/17/2023 40.7 Normal 0.1 1.207 g/cm2 DualFemur Neck Right 10/17/2023 40.7 Normal -0.9 0.910 g/cm2 World Health Organization [facility]) criteria for post-menopausal, Caucasian Women: Normal:       T-score at or above -1 SD Osteopenia:   T-score between -1 and -2.5 SD Osteoporosis: T-score at or below -2.5 SD RECOMMENDATIONS: 1. All patients should optimize calcium  and vitamin D  intake. 2. Consider FDA-approved medical therapies in postmenopausal women and  men aged 69 years and older, based on the following: a. A hip or vertebral (clinical or morphometric) fracture b. T-score < -2.5 at the femoral neck or spine after appropriate evaluation to exclude secondary causes c. Low bone mass (T-score between -1.0 and -2.5 at the femoral neck or spine) and a 10-year probability of a hip fracture > 3% or a 10-year probability of a major osteoporosis-related fracture > 20% based on the [place] -adapted WHO algorithm d. Clinician judgment and/or patient preferences may indicate treatment for people with 10-year fracture probabilities above or below these levels FOLLOW-UP: People with diagnosed cases of osteoporosis or at high risk for fracture should have regular bone mineral density tests. For patients eligible for Medicare, routine testing is allowed once every 2 years. The testing frequency can be increased to one year for patients who have rapidly progressing disease, those who are receiving or discontinuing medical therapy to restore bone mass, or have additional risk  factors. I have reviewed this report, and agree with the above findings. [facility] Electronically Signed   By: Norman Hopper M.D.   On: 10/17/2023 09:24

## 2023-12-22 NOTE — Progress Notes (Signed)
 Patient has been having lower back pain for about 2 months now, she already goes to [facility] in [place]. She would like for [place] to send a referral to them for her to have acupuncture.

## 2023-12-22 NOTE — Assessment & Plan Note (Signed)
 Possible SI joint arthritis, other differential includes radiculopathy, bone mets.  No bone lesions on Nov 2024 PET.  Shared decision was made to observe for now. She plans to get chiropractor evaluation.

## 2023-12-22 NOTE — Assessment & Plan Note (Signed)
Continue calcium supplementation 600mg  TID.

## 2023-12-22 NOTE — Assessment & Plan Note (Signed)
 Recurrent breast cancer with isolated bone metastasis, stage IV #Initially stage IIIA right breast cancer, ER positive, HER-2 negative status post bilateral oophorectomy,Right mastectomy and right axillary lymph node dissection, status post implant, and implant removal.  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 ER positive, HER-2 positive  .- s/p Palliative radiation to T7  Labs reviewed and discussed Proceed with Kanjinti Perjecta.   Continue fulvestrant monthly. 10/20/23 PET NED, discussed with patient.  10/17/23 normal bone density on DEXA

## 2023-12-22 NOTE — Assessment & Plan Note (Signed)
status post radiation. Continue Zometa monthly, hold if Calcium is <8.6  - hold due to recent bone fracture/surgery - Continue calcium supplementation. Continue oxycodone as needed.

## 2023-12-22 NOTE — Assessment & Plan Note (Signed)
Antibody treatment plan as list above. 

## 2023-12-25 ENCOUNTER — Inpatient Hospital Stay: Payer: Medicare HMO

## 2023-12-25 ENCOUNTER — Encounter: Payer: Self-pay | Admitting: Oncology

## 2023-12-25 NOTE — Progress Notes (Signed)
 [facility] CSW Progress Note  Clinical Child Psychotherapist contacted patient by phone to discuss breast cancer grants.  Received referral from Dr. Babara.  Patient stated she was interested in receiving a list of grants which CSW securely emailed to her.  Encouraged her to contact CSW with any questions or concerns.    Macario CHRISTELLA Au, LCSW Clinical Social Worker [facility]

## 2023-12-29 ENCOUNTER — Inpatient Hospital Stay: Payer: Medicare HMO

## 2023-12-29 VITALS — BP 117/60 | HR 107 | Temp 97.7°F | Resp 18

## 2023-12-29 DIAGNOSIS — Z170 Estrogen receptor positive status [ER+]: Secondary | ICD-10-CM

## 2023-12-29 DIAGNOSIS — Z5111 Encounter for antineoplastic chemotherapy: Secondary | ICD-10-CM | POA: Diagnosis not present

## 2023-12-29 MED ORDER — FULVESTRANT 250 MG/5ML IM SOLN
500.0000 mg | INTRAMUSCULAR | Status: DC
Start: 2023-12-29 — End: 2023-12-29

## 2023-12-29 MED ORDER — ZOLEDRONIC ACID 4 MG/100ML IV SOLN
4.0000 mg | Freq: Once | INTRAVENOUS | Status: AC
  Administered 2023-12-29: 4 mg via INTRAVENOUS
  Filled 2023-12-29: qty 100

## 2023-12-29 MED ORDER — FULVESTRANT 250 MG/5ML IM SOSY
500.0000 mg | PREFILLED_SYRINGE | Freq: Once | INTRAMUSCULAR | Status: AC
  Administered 2023-12-29: 500 mg via INTRAMUSCULAR
  Filled 2023-12-29: qty 10

## 2023-12-29 MED ORDER — HEPARIN SOD (PORK) LOCK FLUSH 100 UNIT/ML IV SOLN
500.0000 [IU] | Freq: Once | INTRAVENOUS | Status: AC
  Administered 2023-12-29: 500 [IU] via INTRAVENOUS
  Filled 2023-12-29: qty 5

## 2023-12-29 NOTE — Patient Instructions (Signed)
[facility] - A DEPT OF [facility]  Discharge Instructions: Thank you for choosing [facility] to provide your oncology and hematology care.  If you have a lab appointment with the Cancer Center, please go directly to the Cancer Center and check in at the registration area.  Wear comfortable clothing and clothing appropriate for easy access to any Portacath or PICC line.   We strive to give you quality time with your provider. You may need to reschedule your appointment if you arrive late (15 or more minutes).  Arriving late affects you and other patients whose appointments are after yours.  Also, if you miss three or more appointments without notifying the office, you may be dismissed from the clinic at the provider's discretion.      For prescription refill requests, have your pharmacy contact our office and allow 72 hours for refills to be completed.    Today you received the following chemotherapy and/or immunotherapy agents Faslodex and Zometa.      To help prevent nausea and vomiting after your treatment, we encourage you to take your nausea medication as directed.  BELOW ARE SYMPTOMS THAT SHOULD BE REPORTED IMMEDIATELY: *FEVER GREATER THAN 100.4 F (38 C) OR HIGHER *CHILLS OR SWEATING *NAUSEA AND VOMITING THAT IS NOT CONTROLLED WITH YOUR NAUSEA MEDICATION *UNUSUAL SHORTNESS OF BREATH *UNUSUAL BRUISING OR BLEEDING *URINARY PROBLEMS (pain or burning when urinating, or frequent urination) *BOWEL PROBLEMS (unusual diarrhea, constipation, pain near the anus) TENDERNESS IN MOUTH AND THROAT WITH OR WITHOUT PRESENCE OF ULCERS (sore throat, sores in mouth, or a toothache) UNUSUAL RASH, SWELLING OR PAIN  UNUSUAL VAGINAL DISCHARGE OR ITCHING   Items with * indicate a potential emergency and should be followed up as soon as possible or go to the Emergency Department if any problems should occur.  Please show the CHEMOTHERAPY ALERT CARD or  IMMUNOTHERAPY ALERT CARD at check-in to the Emergency Department and triage nurse.  Should you have questions after your visit or need to cancel or reschedule your appointment, please contact [facility] - A DEPT OF [facility]  [phone] and follow the prompts.  Office hours are 8:00 a.m. to 4:30 p.m. Monday - Friday. Please note that voicemails left after 4:00 p.m. may not be returned until the following business day.  We are closed weekends and major holidays. You have access to a nurse at all times for urgent questions. Please call the main number to the clinic [phone] and follow the prompts.  For any non-urgent questions, you may also contact your provider using MyChart. We now offer e-Visits for anyone 38 and older to request care online for non-urgent symptoms. For details visit [email or web address].   Also download the MyChart app! Go to the app store, search "MyChart", open the app, select [place], and log in with your MyChart username and password.

## 2023-12-31 ENCOUNTER — Encounter: Payer: Self-pay | Admitting: Oncology

## 2024-01-02 ENCOUNTER — Other Ambulatory Visit: Payer: Self-pay

## 2024-01-02 ENCOUNTER — Emergency Department: Payer: Medicare HMO

## 2024-01-02 ENCOUNTER — Encounter: Payer: Self-pay | Admitting: *Deleted

## 2024-01-02 ENCOUNTER — Emergency Department
Admission: EM | Admit: 2024-01-02 | Discharge: 2024-01-02 | Disposition: A | Discharge status: Home / Self Care | Payer: Medicare HMO | Attending: Emergency Medicine | Admitting: Emergency Medicine

## 2024-01-02 DIAGNOSIS — Z853 Personal history of malignant neoplasm of breast: Secondary | ICD-10-CM | POA: Insufficient documentation

## 2024-01-02 DIAGNOSIS — M545 Low back pain, unspecified: Secondary | ICD-10-CM

## 2024-01-02 DIAGNOSIS — J449 Chronic obstructive pulmonary disease, unspecified: Secondary | ICD-10-CM | POA: Insufficient documentation

## 2024-01-02 DIAGNOSIS — M5127 Other intervertebral disc displacement, lumbosacral region: Secondary | ICD-10-CM | POA: Insufficient documentation

## 2024-01-02 LAB — COMPREHENSIVE METABOLIC PANEL
ALT: 15 U/L (ref 0–44)
AST: 13 U/L — ABNORMAL LOW (ref 15–41)
Albumin: 4.4 g/dL (ref 3.5–5.0)
Alkaline Phosphatase: 67 U/L (ref 38–126)
Anion gap: 9 (ref 5–15)
BUN: 16 mg/dL (ref 6–20)
CO2: 23 mmol/L (ref 22–32)
Calcium: 9.7 mg/dL (ref 8.9–10.3)
Chloride: 104 mmol/L (ref 98–111)
Creatinine, Ser: 0.76 mg/dL (ref 0.44–1.00)
GFR, Estimated: >60 mL/min (ref >60)
Glucose, Bld: 87 mg/dL (ref 70–99)
Potassium: 3.8 mmol/L (ref 3.5–5.1)
Sodium: 136 mmol/L (ref 135–145)
Total Bilirubin: 0.2 mg/dL (ref 0.0–1.2)
Total Protein: 8 g/dL (ref 6.5–8.1)

## 2024-01-02 LAB — CBC WITH DIFFERENTIAL/PLATELET
Abs Immature Granulocytes: 0.04 10*3/uL (ref 0.00–0.07)
Basophils Absolute: 0.1 10*3/uL (ref 0.0–0.1)
Basophils Relative: 1 %
Eosinophils Absolute: 0.1 10*3/uL (ref 0.0–0.5)
Eosinophils Relative: 1 %
HCT: 36.5 % (ref 36.0–46.0)
Hemoglobin: 11.8 g/dL — ABNORMAL LOW (ref 12.0–15.0)
Immature Granulocytes: 0 %
Lymphocytes Relative: 21 %
Lymphs Abs: 2.3 10*3/uL (ref 0.7–4.0)
MCH: 29.3 pg (ref 26.0–34.0)
MCHC: 32.3 g/dL (ref 30.0–36.0)
MCV: 90.6 fL (ref 80.0–100.0)
Monocytes Absolute: 0.7 10*3/uL (ref 0.1–1.0)
Monocytes Relative: 7 %
Neutro Abs: 7.6 10*3/uL (ref 1.7–7.7)
Neutrophils Relative %: 70 %
Platelets: 327 10*3/uL (ref 150–400)
RBC: 4.03 MIL/uL (ref 3.87–5.11)
RDW: 13.8 % (ref 11.5–15.5)
WBC: 10.7 10*3/uL — ABNORMAL HIGH (ref 4.0–10.5)
nRBC: 0 % (ref 0.0–0.2)

## 2024-01-02 LAB — LIPASE, BLOOD: Lipase: 53 U/L — ABNORMAL HIGH (ref 11–51)

## 2024-01-02 MED ORDER — MELOXICAM 15 MG PO TABS
15.0000 mg | ORAL_TABLET | Freq: Every day | ORAL | 0 refills | Status: AC
Start: 2024-01-02 — End: 2024-01-16

## 2024-01-02 NOTE — ED Provider Notes (Signed)
[facility] Provider Note   Event Date/Time   First MD Initiated Contact with Patient 01/02/24 2028     (approximate) History  Back Pain  HPI Theresa Chaney is a 43 y.o. female currently undergoing chemotherapy for breast cancer as well as past medical history of COPD and GERD who presents complaining of lower lumbar back pain that is been present over the last month but acutely worsened today.  Patient states that she is scheduled for an MRI later in the week however the pain acutely worsened today and brought her to the emergency department.  Patient denies any bowel/bladder incontinence or saddle anesthesia. ROS: Patient currently denies any vision changes, tinnitus, difficulty speaking, facial droop, sore throat, chest pain, shortness of breath, abdominal pain, nausea/vomiting/diarrhea, dysuria, or weakness/numbness/paresthesias in any extremity   Physical Exam  Triage Vital Signs: ED Triage Vitals  Encounter Vitals Group     BP 01/02/24 1825 112/89     Systolic BP Percentile --      Diastolic BP Percentile --      Pulse Rate 01/02/24 1825 (!) 130     Resp 01/02/24 1825 20     Temp 01/02/24 1825 98.6 F (37 C)     Temp src --      SpO2 01/02/24 1825 100 %     Weight 01/02/24 1822 180 lb (81.6 kg)     Height 01/02/24 1822 5\' 7"  (1.702 m)     Head Circumference --      Peak Flow --      Pain Score 01/02/24 1822 10     Pain Loc --      Pain Education --      Exclude from Growth Chart --    Most recent vital signs: Vitals:   01/02/24 1825  BP: 112/89  Pulse: (!) 130  Resp: 20  Temp: 98.6 F (37 C)  SpO2: 100%   General: Awake, oriented x4. CV:  Good peripheral perfusion.  Resp:  Normal effort.  Abd:  No distention.  Other:  Middle-aged overweight Caucasian female resting comfortably in no acute distress.  Tenderness to palpation over lower lumbar paraspinal musculature ED Results / Procedures / Treatments  Labs (all labs ordered are listed,  but only abnormal results are displayed) Labs Reviewed  COMPREHENSIVE METABOLIC PANEL - Abnormal; Notable for the following components:      Result Value   AST 13 (*)    All other components within normal limits  CBC WITH DIFFERENTIAL/PLATELET - Abnormal; Notable for the following components:   WBC 10.7 (*)    Hemoglobin 11.8 (*)    All other components within normal limits  LIPASE, BLOOD  URINALYSIS, ROUTINE W REFLEX MICROSCOPIC   RADIOLOGY ED MD interpretation: CT of the lumbar spine interpreted independently and shows mild broad-based disc bulge at L5-S1 -Agree with radiology assessment Official radiology report(s): CT Lumbar Spine Wo Contrast Result Date: 01/02/2024 CLINICAL DATA:  Low back pain.  History of metastatic breast cancer. EXAM: CT LUMBAR SPINE WITHOUT CONTRAST TECHNIQUE: Multidetector CT imaging of the lumbar spine was performed without intravenous contrast administration. Multiplanar CT image reconstructions were also generated. RADIATION DOSE REDUCTION: This exam was performed according to the departmental dose-optimization program which includes automated exposure control, adjustment of the mA and/or kV according to patient size and/or use of iterative reconstruction technique. COMPARISON:  Nuclear medicine PET CT 10/11/2023 FINDINGS: Segmentation: 5 lumbar type vertebrae. Alignment: Normal. Vertebrae: No acute fracture or focal pathologic process. Paraspinal  and other soft tissues: Negative. Scattered calcifications in the distal abdominal aorta and iliac vessels. Disc levels: Mild disc bulge at L5-S1.  Disc spaces maintained. IMPRESSION: No acute or focal bony abnormality. Mild broad-based disc bulge at L5-S1. Electronically Signed   By: Charlett Nose M.D.   On: 01/02/2024 19:15   PROCEDURES: Critical Care performed: No Procedures MEDICATIONS ORDERED IN ED: Medications - No data to display IMPRESSION / MDM / ASSESSMENT AND PLAN / ED COURSE  I reviewed the triage vital  signs and the nursing notes.                             The patient is on the cardiac monitor to evaluate for evidence of arrhythmia and/or significant heart rate changes. Patient's presentation is most consistent with acute presentation with potential threat to life or bodily function. Patient presents for low back pain. Given History and Exam the patient appears to be at low risk for Spinal Cord Compression Syndrome, Vertebral Malignancy/Mets, acute Spinal Fracture, Vertebral Osteomyelitis, Epidural Abscess, Infected or Obstructing Kidney Stone.  Their presentation appears most likely to be secondary to non-emergent musculoskeletal etiology vs non-emergent disc herniation.  ED Workup: CT of the lumbar spine shows mild broad-based disc bulge at L5-S1  Disposition: Discharge. Strict return precautions discussed with patient with full understanding. Advised patient to follow up promptly with primary care provider   FINAL CLINICAL IMPRESSION(S) / ED DIAGNOSES   Final diagnoses:  Herniation of intervertebral disc between L5 and S1   Rx / DC Orders   ED Discharge Orders          Ordered    meloxicam (MOBIC) 15 MG tablet  Daily        01/02/24 2112           Note:  This document was prepared using Dragon voice recognition software and may include unintentional dictation errors.   Merwyn Katos, MD 01/02/24 2121

## 2024-01-02 NOTE — ED Provider Triage Note (Signed)
Emergency Medicine Provider Triage Evaluation Note  Theresa Chaney , a 43 y.o. female  was evaluated in triage.  Pt complains of low back pain x1 month. History of metastatic breast cancer, undergoing chemo. No injury. Pain across low back. No radicular symptoms. No paresthesias or weakness in legs. No urinary/fecal incontinence or retention. No dysuria or hematuria. No fever or constitutional symptoms.  Review of Systems  Positive: Back pain Negative: Numbness/tingling/weakness, urinary/bowel problems, fevers, abd pain  Physical Exam  BP 112/89 (BP Location: Right Arm)   Pulse (!) 130   Temp 98.6 F (37 C)   Resp 20   Ht 5\' 7"  (1.702 m)   Wt 81.6 kg   LMP  (LMP Unknown) Comment: Ovaries and tubes removed.April 2019  SpO2 100%   BMI 28.19 kg/m  Gen:   Awake, no distress   Resp:  Normal effort  MSK:   Moves extremities without difficulty  Other:    Medical Decision Making  Medically screening exam initiated at 6:25 PM.  Appropriate orders placed.  Nada HRITZ was informed that the remainder of the evaluation will be completed by another provider, this initial triage assessment does not replace that evaluation, and the importance of remaining in the ED until their evaluation is complete.     Jackelyn Hoehn, PA-C 01/02/24 1830

## 2024-01-02 NOTE — ED Triage Notes (Signed)
Pt ambulatory to triage.  Pt has lower back pain for 1 month  treated by chiropractor.  Pt states mri set up, but pain is worse.  Hx breast cancer  chemo last week.  Pt has a port.  Pt alert.  Speech clear.

## 2024-01-02 NOTE — ED Notes (Signed)
 Patient discharged from ED by provider. Discharge instructions reviewed with patient and all questions answered. Patient ambulatory from ED in NAD.

## 2024-01-03 ENCOUNTER — Ambulatory Visit
Admission: RE | Admit: 2024-01-03 | Discharge: 2024-01-03 | Disposition: A | Discharge status: Home / Self Care | Payer: Medicare HMO | Source: Ambulatory Visit | Attending: Oncology | Admitting: Oncology

## 2024-01-03 DIAGNOSIS — M545 Low back pain, unspecified: Secondary | ICD-10-CM | POA: Diagnosis present

## 2024-01-03 DIAGNOSIS — M79604 Pain in right leg: Secondary | ICD-10-CM | POA: Diagnosis present

## 2024-01-03 MED ORDER — GADOBUTROL 1 MMOL/ML IV SOLN
8.0000 mL | Freq: Once | INTRAVENOUS | Status: AC | PRN
  Administered 2024-01-03: 8 mL via INTRAVENOUS

## 2024-01-05 ENCOUNTER — Ambulatory Visit: Payer: Medicare HMO

## 2024-01-05 ENCOUNTER — Other Ambulatory Visit: Payer: Medicare HMO

## 2024-01-09 ENCOUNTER — Other Ambulatory Visit: Payer: Self-pay | Admitting: Oncology

## 2024-01-09 ENCOUNTER — Ambulatory Visit: Payer: Medicare HMO

## 2024-01-10 ENCOUNTER — Encounter: Payer: Self-pay | Admitting: Oncology

## 2024-01-10 MED ORDER — OXYCODONE HCL 5 MG PO TABS: 5.0000 mg | ORAL_TABLET | Freq: Four times a day (QID) | ORAL | 0 refills | Status: DC | PRN

## 2024-01-11 ENCOUNTER — Other Ambulatory Visit: Payer: Self-pay | Admitting: Oncology

## 2024-01-12 ENCOUNTER — Inpatient Hospital Stay: Payer: Medicare HMO

## 2024-01-12 VITALS — BP 109/79 | HR 102 | Temp 96.7°F | Resp 18 | Wt 179.9 lb

## 2024-01-12 DIAGNOSIS — C50811 Malignant neoplasm of overlapping sites of right female breast: Secondary | ICD-10-CM

## 2024-01-12 DIAGNOSIS — Z5111 Encounter for antineoplastic chemotherapy: Secondary | ICD-10-CM | POA: Diagnosis not present

## 2024-01-12 LAB — COMPREHENSIVE METABOLIC PANEL
ALT: 18 U/L (ref 0–44)
AST: 24 U/L (ref 15–41)
Albumin: 4.2 g/dL (ref 3.5–5.0)
Alkaline Phosphatase: 67 U/L (ref 38–126)
Anion gap: 8 (ref 5–15)
BUN: 20 mg/dL (ref 6–20)
CO2: 24 mmol/L (ref 22–32)
Calcium: 8.8 mg/dL — ABNORMAL LOW (ref 8.9–10.3)
Chloride: 106 mmol/L (ref 98–111)
Creatinine, Ser: 0.8 mg/dL (ref 0.44–1.00)
GFR, Estimated: >60 mL/min (ref >60)
Glucose, Bld: 118 mg/dL — ABNORMAL HIGH (ref 70–99)
Potassium: 3.3 mmol/L — ABNORMAL LOW (ref 3.5–5.1)
Sodium: 138 mmol/L (ref 135–145)
Total Bilirubin: 0.6 mg/dL (ref 0.0–1.2)
Total Protein: 7.5 g/dL (ref 6.5–8.1)

## 2024-01-12 LAB — CBC WITH DIFFERENTIAL/PLATELET
Abs Immature Granulocytes: 0.02 10*3/uL (ref 0.00–0.07)
Basophils Absolute: 0.1 10*3/uL (ref 0.0–0.1)
Basophils Relative: 1 %
Eosinophils Absolute: 0.1 10*3/uL (ref 0.0–0.5)
Eosinophils Relative: 2 %
HCT: 33.7 % — ABNORMAL LOW (ref 36.0–46.0)
Hemoglobin: 11 g/dL — ABNORMAL LOW (ref 12.0–15.0)
Immature Granulocytes: 0 %
Lymphocytes Relative: 20 %
Lymphs Abs: 1.6 10*3/uL (ref 0.7–4.0)
MCH: 28.7 pg (ref 26.0–34.0)
MCHC: 32.6 g/dL (ref 30.0–36.0)
MCV: 88 fL (ref 80.0–100.0)
Monocytes Absolute: 0.7 10*3/uL (ref 0.1–1.0)
Monocytes Relative: 8 %
Neutro Abs: 5.6 10*3/uL (ref 1.7–7.7)
Neutrophils Relative %: 69 %
Platelets: 299 10*3/uL (ref 150–400)
RBC: 3.83 MIL/uL — ABNORMAL LOW (ref 3.87–5.11)
RDW: 13.2 % (ref 11.5–15.5)
WBC: 8.1 10*3/uL (ref 4.0–10.5)
nRBC: 0 % (ref 0.0–0.2)

## 2024-01-12 MED ORDER — SODIUM CHLORIDE 0.9% FLUSH
10.0000 mL | INTRAVENOUS | Status: DC | PRN
  Administered 2024-01-12: 10 mL
  Filled 2024-01-12: qty 10

## 2024-01-12 MED ORDER — PERTUZUMAB CHEMO INJECTION 420 MG/14ML
420.0000 mg | Freq: Once | INTRAVENOUS | Status: AC
  Administered 2024-01-12: 420 mg via INTRAVENOUS
  Filled 2024-01-12: qty 14

## 2024-01-12 MED ORDER — TRASTUZUMAB-ANNS CHEMO 150 MG IV SOLR
6.0000 mg/kg | Freq: Once | INTRAVENOUS | Status: AC
  Administered 2024-01-12: 462 mg via INTRAVENOUS
  Filled 2024-01-12: qty 22

## 2024-01-12 MED ORDER — HEPARIN SOD (PORK) LOCK FLUSH 100 UNIT/ML IV SOLN
500.0000 [IU] | Freq: Once | INTRAVENOUS | Status: AC | PRN
  Administered 2024-01-12: 500 [IU]
  Filled 2024-01-12: qty 5

## 2024-01-12 MED ORDER — SODIUM CHLORIDE 0.9 % IV SOLN
Freq: Once | INTRAVENOUS | Status: AC
  Filled 2024-01-12: qty 250

## 2024-01-16 ENCOUNTER — Encounter: Payer: Self-pay | Admitting: Oncology

## 2024-01-25 ENCOUNTER — Telehealth: Payer: Self-pay | Admitting: Genetic Counselor

## 2024-01-25 ENCOUNTER — Encounter: Payer: Self-pay | Admitting: Oncology

## 2024-01-25 NOTE — Progress Notes (Deleted)
Error

## 2024-01-25 NOTE — Telephone Encounter (Signed)
Received message that Theresa Chaney needed copy of genetic results from 2018.  LVM that we will mail copy to her home address.  Also uploaded to "results tab" so she can view in MyChart.

## 2024-01-26 ENCOUNTER — Other Ambulatory Visit: Payer: Self-pay | Admitting: Oncology

## 2024-01-26 ENCOUNTER — Inpatient Hospital Stay: Payer: Medicare HMO | Attending: Oncology

## 2024-01-26 ENCOUNTER — Encounter: Payer: Self-pay | Admitting: Oncology

## 2024-01-26 VITALS — BP 108/70 | HR 103 | Temp 96.2°F | Resp 19

## 2024-01-26 DIAGNOSIS — C50411 Malignant neoplasm of upper-outer quadrant of right female breast: Secondary | ICD-10-CM | POA: Insufficient documentation

## 2024-01-26 DIAGNOSIS — Z170 Estrogen receptor positive status [ER+]: Secondary | ICD-10-CM | POA: Insufficient documentation

## 2024-01-26 DIAGNOSIS — Z79899 Other long term (current) drug therapy: Secondary | ICD-10-CM | POA: Insufficient documentation

## 2024-01-26 DIAGNOSIS — Z5112 Encounter for antineoplastic immunotherapy: Secondary | ICD-10-CM | POA: Diagnosis present

## 2024-01-26 LAB — CMP (CANCER CENTER ONLY)
ALT: 19 U/L (ref 0–44)
AST: 21 U/L (ref 15–41)
Albumin: 4 g/dL (ref 3.5–5.0)
Alkaline Phosphatase: 67 U/L (ref 38–126)
Anion gap: 8 (ref 5–15)
BUN: 19 mg/dL (ref 6–20)
CO2: 20 mmol/L — ABNORMAL LOW (ref 22–32)
Calcium: 8.3 mg/dL — ABNORMAL LOW (ref 8.9–10.3)
Chloride: 108 mmol/L (ref 98–111)
Creatinine: 0.73 mg/dL (ref 0.44–1.00)
GFR, Estimated: >60 mL/min (ref >60)
Glucose, Bld: 136 mg/dL — ABNORMAL HIGH (ref 70–99)
Potassium: 3.4 mmol/L — ABNORMAL LOW (ref 3.5–5.1)
Sodium: 136 mmol/L (ref 135–145)
Total Bilirubin: 0.3 mg/dL (ref 0.0–1.2)
Total Protein: 7.5 g/dL (ref 6.5–8.1)

## 2024-01-26 MED ORDER — SODIUM CHLORIDE 0.9 % IV SOLN
Freq: Once | INTRAVENOUS | Status: AC
  Filled 2024-01-26: qty 250

## 2024-01-26 MED ORDER — HEPARIN SOD (PORK) LOCK FLUSH 100 UNIT/ML IV SOLN
500.0000 [IU] | Freq: Once | INTRAVENOUS | Status: AC | PRN
  Administered 2024-01-26: 500 [IU]
  Filled 2024-01-26: qty 5

## 2024-01-26 MED ORDER — FULVESTRANT 250 MG/5ML IM SOSY
500.0000 mg | PREFILLED_SYRINGE | INTRAMUSCULAR | Status: DC
  Administered 2024-01-26: 500 mg via INTRAMUSCULAR
  Filled 2024-01-26: qty 10

## 2024-01-26 MED ORDER — SODIUM CHLORIDE 0.9 % IV SOLN: 420.0000 mg | Freq: Once | INTRAVENOUS | Status: DC

## 2024-01-26 MED ORDER — ZOLEDRONIC ACID 4 MG/100ML IV SOLN: 4.0000 mg | Freq: Once | INTRAVENOUS | Status: DC

## 2024-01-26 MED ORDER — TRASTUZUMAB-ANNS CHEMO 150 MG IV SOLR: 6.0000 mg/kg | Freq: Once | INTRAVENOUS | Status: DC

## 2024-01-26 NOTE — Patient Instructions (Addendum)
[facility] - A DEPT OF [facility]  Discharge Instructions: Thank you for choosing [facility] to provide your oncology and hematology care.  If you have a lab appointment with the Cancer Center, please go directly to the Cancer Center and check in at the registration area.  Wear comfortable clothing and clothing appropriate for easy access to any Portacath or PICC line.   We strive to give you quality time with your provider. You may need to reschedule your appointment if you arrive late (15 or more minutes).  Arriving late affects you and other patients whose appointments are after yours.  Also, if you miss three or more appointments without notifying the office, you may be dismissed from the clinic at the provider's discretion.      For prescription refill requests, have your pharmacy contact our office and allow 72 hours for refills to be completed.    Today you received the following chemotherapy and/or immunotherapy agents  FULVESTRANT   To help prevent nausea and vomiting after your treatment, we encourage you to take your nausea medication as directed.  BELOW ARE SYMPTOMS THAT SHOULD BE REPORTED IMMEDIATELY: *FEVER GREATER THAN 100.4 F (38 C) OR HIGHER *CHILLS OR SWEATING *NAUSEA AND VOMITING THAT IS NOT CONTROLLED WITH YOUR NAUSEA MEDICATION *UNUSUAL SHORTNESS OF BREATH *UNUSUAL BRUISING OR BLEEDING *URINARY PROBLEMS (pain or burning when urinating, or frequent urination) *BOWEL PROBLEMS (unusual diarrhea, constipation, pain near the anus) TENDERNESS IN MOUTH AND THROAT WITH OR WITHOUT PRESENCE OF ULCERS (sore throat, sores in mouth, or a toothache) UNUSUAL RASH, SWELLING OR PAIN  UNUSUAL VAGINAL DISCHARGE OR ITCHING   Items with * indicate a potential emergency and should be followed up as soon as possible or go to the Emergency Department if any problems should occur.  Please show the CHEMOTHERAPY ALERT CARD or IMMUNOTHERAPY  ALERT CARD at check-in to the Emergency Department and triage nurse.  Should you have questions after your visit or need to cancel or reschedule your appointment, please contact [facility] - A DEPT OF [facility]  [phone] and follow the prompts.  Office hours are 8:00 a.m. to 4:30 p.m. Monday - Friday. Please note that voicemails left after 4:00 p.m. may not be returned until the following business day.  We are closed weekends and major holidays. You have access to a nurse at all times for urgent questions. Please call the main number to the clinic [phone] and follow the prompts.  For any non-urgent questions, you may also contact your provider using MyChart. We now offer e-Visits for anyone 50 and older to request care online for non-urgent symptoms. For details visit [email or web address].   Also download the MyChart app! Go to the app store, search "MyChart", open the app, select , and log in with your MyChart username and password.  Fulvestrant Injection What is this medication? FULVESTRANT (ful VES trant) treats breast cancer. It works by blocking the hormone estrogen in breast tissue, which prevents breast cancer cells from spreading or growing. This medicine may be used for other purposes; ask your health care provider or pharmacist if you have questions. COMMON BRAND NAME(S): FASLODEX What should I tell my care team before I take this medication? They need to know if you have any of these conditions: Bleeding disorder Liver disease Low blood cell levels (white cells, red cells, and platelets) An unusual or allergic reaction to fulvestrant, other  medications, foods, dyes, or preservatives Pregnant or trying to get pregnant Breastfeeding How should I use this medication? This medication is injected into a muscle. It is given by your care team in a hospital or clinic setting. Talk to your care team about the use of this medication  in children. Special care may be needed. Overdosage: If you think you have taken too much of this medicine contact a poison control center or emergency room at once. NOTE: This medicine is only for you. Do not share this medicine with others. What if I miss a dose? Keep appointments for follow-up doses. It is important not to miss your dose. Call your care team if you are unable to keep an appointment. What may interact with this medication? Fluoroestradiol F18 This list may not describe all possible interactions. Give your health care provider a list of all the medicines, herbs, non-prescription drugs, or dietary supplements you use. Also tell them if you smoke, drink alcohol, or use illegal drugs. Some items may interact with your medicine. What should I watch for while using this medication? Your condition will be monitored carefully while you are receiving this medication. You may need blood work done while you are taking this medication. This medication is injected into a muscle. Talk to your care team if you also take medications that prevent or treat blood clots, such as warfarin. Blood thinners may increase the risk of bleeding or bruising in the muscle where this medication is injected. The benefits of this medication may outweigh the risks. Your care team can help you find the option that works for you. They can also help limit the risk of bleeding. Talk to your care team if you may be pregnant. Serious birth defects can occur if you take this medication during pregnancy and for 1 year after the last dose. You will need a negative pregnancy test before starting this medication. Contraception is recommended while taking this medication and for 1 year after the last dose. Your care team can help you find the option that works for you. Do not breastfeed while taking this medication and for 1 year after the last dose. This medication may cause infertility. Talk to your care team if you are  concerned about your fertility. What side effects may I notice from receiving this medication? Side effects that you should report to your care team as soon as possible: Allergic reactions or angioedema--skin rash, itching or hives, swelling of the face, eyes, lips, tongue, arms, or legs, trouble swallowing or breathing Pain, tingling, or numbness in the hands or feet Side effects that usually do not require medical attention (report to your care team if they continue or are bothersome): Bone, joint, or muscle pain Constipation Headache Hot flashes Nausea Pain, redness, or irritation at injection site Unusual weakness or fatigue This list may not describe all possible side effects. Call your doctor for medical advice about side effects. You may report side effects to FDA at 1-800-FDA-1088. Where should I keep my medication? This medication is given in a hospital or clinic. It will not be stored at home. NOTE: This sheet is a summary. It may not cover all possible information. If you have questions about this medicine, talk to your doctor, pharmacist, or health care provider.  2024 Elsevier/Gold Standard (2023-08-04 00:00:00)

## 2024-02-02 ENCOUNTER — Inpatient Hospital Stay (HOSPITAL_BASED_OUTPATIENT_CLINIC_OR_DEPARTMENT_OTHER): Payer: Medicare HMO | Admitting: Oncology

## 2024-02-02 ENCOUNTER — Inpatient Hospital Stay: Payer: Medicare HMO

## 2024-02-02 ENCOUNTER — Encounter: Payer: Self-pay | Admitting: Oncology

## 2024-02-02 VITALS — BP 122/74 | HR 108 | Temp 96.0°F | Resp 18 | Wt 182.2 lb

## 2024-02-02 DIAGNOSIS — C7951 Secondary malignant neoplasm of bone: Secondary | ICD-10-CM

## 2024-02-02 DIAGNOSIS — Z170 Estrogen receptor positive status [ER+]: Secondary | ICD-10-CM

## 2024-02-02 DIAGNOSIS — C50411 Malignant neoplasm of upper-outer quadrant of right female breast: Secondary | ICD-10-CM

## 2024-02-02 DIAGNOSIS — E876 Hypokalemia: Secondary | ICD-10-CM | POA: Diagnosis not present

## 2024-02-02 DIAGNOSIS — Z5112 Encounter for antineoplastic immunotherapy: Secondary | ICD-10-CM | POA: Diagnosis not present

## 2024-02-02 DIAGNOSIS — M545 Low back pain, unspecified: Secondary | ICD-10-CM

## 2024-02-02 DIAGNOSIS — C50811 Malignant neoplasm of overlapping sites of right female breast: Secondary | ICD-10-CM

## 2024-02-02 DIAGNOSIS — M069 Rheumatoid arthritis, unspecified: Secondary | ICD-10-CM

## 2024-02-02 DIAGNOSIS — M79604 Pain in right leg: Secondary | ICD-10-CM

## 2024-02-02 DIAGNOSIS — I428 Other cardiomyopathies: Secondary | ICD-10-CM

## 2024-02-02 LAB — COMPREHENSIVE METABOLIC PANEL
ALT: 17 U/L (ref 0–44)
AST: 25 U/L (ref 15–41)
Albumin: 4.2 g/dL (ref 3.5–5.0)
Alkaline Phosphatase: 61 U/L (ref 38–126)
Anion gap: 9 (ref 5–15)
BUN: 16 mg/dL (ref 6–20)
CO2: 26 mmol/L (ref 22–32)
Calcium: 9 mg/dL (ref 8.9–10.3)
Chloride: 105 mmol/L (ref 98–111)
Creatinine, Ser: 0.82 mg/dL (ref 0.44–1.00)
GFR, Estimated: >60 mL/min (ref >60)
Glucose, Bld: 116 mg/dL — ABNORMAL HIGH (ref 70–99)
Potassium: 3.5 mmol/L (ref 3.5–5.1)
Sodium: 140 mmol/L (ref 135–145)
Total Bilirubin: 0.4 mg/dL (ref 0.0–1.2)
Total Protein: 6.9 g/dL (ref 6.5–8.1)

## 2024-02-02 LAB — CBC WITH DIFFERENTIAL/PLATELET
Abs Immature Granulocytes: 0.03 10*3/uL (ref 0.00–0.07)
Basophils Absolute: 0.1 10*3/uL (ref 0.0–0.1)
Basophils Relative: 1 %
Eosinophils Absolute: 0.1 10*3/uL (ref 0.0–0.5)
Eosinophils Relative: 2 %
HCT: 32.9 % — ABNORMAL LOW (ref 36.0–46.0)
Hemoglobin: 11 g/dL — ABNORMAL LOW (ref 12.0–15.0)
Immature Granulocytes: 0 %
Lymphocytes Relative: 29 %
Lymphs Abs: 2.1 10*3/uL (ref 0.7–4.0)
MCH: 29.3 pg (ref 26.0–34.0)
MCHC: 33.4 g/dL (ref 30.0–36.0)
MCV: 87.7 fL (ref 80.0–100.0)
Monocytes Absolute: 0.6 10*3/uL (ref 0.1–1.0)
Monocytes Relative: 8 %
Neutro Abs: 4.4 10*3/uL (ref 1.7–7.7)
Neutrophils Relative %: 60 %
Platelets: 339 10*3/uL (ref 150–400)
RBC: 3.75 MIL/uL — ABNORMAL LOW (ref 3.87–5.11)
RDW: 13.2 % (ref 11.5–15.5)
WBC: 7.3 10*3/uL (ref 4.0–10.5)
nRBC: 0 % (ref 0.0–0.2)

## 2024-02-02 MED ORDER — TRASTUZUMAB-ANNS CHEMO 150 MG IV SOLR
6.0000 mg/kg | Freq: Once | INTRAVENOUS | Status: AC
  Administered 2024-02-02: 462 mg via INTRAVENOUS
  Filled 2024-02-02: qty 22

## 2024-02-02 MED ORDER — SODIUM CHLORIDE 0.9 % IV SOLN
Freq: Once | INTRAVENOUS | Status: AC
  Filled 2024-02-02: qty 250

## 2024-02-02 MED ORDER — PERTUZUMAB CHEMO INJECTION 420 MG/14ML
420.0000 mg | Freq: Once | INTRAVENOUS | Status: AC
  Administered 2024-02-02: 420 mg via INTRAVENOUS
  Filled 2024-02-02: qty 14

## 2024-02-02 MED ORDER — HEPARIN SOD (PORK) LOCK FLUSH 100 UNIT/ML IV SOLN
500.0000 [IU] | Freq: Once | INTRAVENOUS | Status: AC | PRN
Start: 2024-02-02 — End: 2024-02-02
  Administered 2024-02-02: 500 [IU]
  Filled 2024-02-02: qty 5

## 2024-02-02 NOTE — Patient Instructions (Signed)
 [facility]  Discharge Instructions: Thank you for choosing [facility] to provide your oncology and hematology care.  If you have a lab appointment with the Cancer Center, please go directly to the Cancer Center and check in at the registration area.  Wear comfortable clothing and clothing appropriate for easy access to any Portacath or PICC line.   We strive to give you quality time with your provider. You may need to reschedule your appointment if you arrive late (15 or more minutes).  Arriving late affects you and other patients whose appointments are after yours.  Also, if you miss three or more appointments without notifying the office, you may be dismissed from the clinic at the provider's discretion.      For prescription refill requests, have your pharmacy contact our office and allow 72 hours for refills to be completed.    Today you received the following chemotherapy and/or immunotherapy agents KANJINTI and PERJETA       To help prevent nausea and vomiting after your treatment, we encourage you to take your nausea medication as directed.  BELOW ARE SYMPTOMS THAT SHOULD BE REPORTED IMMEDIATELY: *FEVER GREATER THAN 100.4 F (38 C) OR HIGHER *CHILLS OR SWEATING *NAUSEA AND VOMITING THAT IS NOT CONTROLLED WITH YOUR NAUSEA MEDICATION *UNUSUAL SHORTNESS OF BREATH *UNUSUAL BRUISING OR BLEEDING *URINARY PROBLEMS (pain or burning when urinating, or frequent urination) *BOWEL PROBLEMS (unusual diarrhea, constipation, pain near the anus) TENDERNESS IN MOUTH AND THROAT WITH OR WITHOUT PRESENCE OF ULCERS (sore throat, sores in mouth, or a toothache) UNUSUAL RASH, SWELLING OR PAIN  UNUSUAL VAGINAL DISCHARGE OR ITCHING   Items with * indicate a potential emergency and should be followed up as soon as possible or go to the Emergency Department if any problems should occur.  Please show the CHEMOTHERAPY ALERT CARD or  IMMUNOTHERAPY ALERT CARD at check-in to the Emergency Department and triage nurse.  Should you have questions after your visit or need to cancel or reschedule your appointment, please contact [facility] - A DEPT OF [facility]  [phone] and follow the prompts.  Office hours are 8:00 a.m. to 4:30 p.m. Monday - Friday. Please note that voicemails left after 4:00 p.m. may not be returned until the following business day.  We are closed weekends and major holidays. You have access to a nurse at all times for urgent questions. Please call the main number to the clinic [phone] and follow the prompts.  For any non-urgent questions, you may also contact your provider using MyChart. We now offer e-Visits for anyone 81 and older to request care online for non-urgent symptoms. For details visit [email or web address].   Also download the MyChart app! Go to the app store, search "MyChart", open the app, select [facility], and log in with your MyChart username and password.   Trastuzumab Injection What is this medication? TRASTUZUMAB (tras TOO zoo mab) treats breast cancer and stomach cancer. It works by blocking a protein that causes cancer cells to grow and multiply. This helps to slow or stop the spread of cancer cells. This medicine may be used for other purposes; ask your health care provider or pharmacist if you have questions. COMMON BRAND NAME(S): Herceptin, HERCESSI, Herzuma, KANJINTI, Ogivri, Ontruzant, Trazimera What should I tell my care team before I take this medication? They need to know if you have any of these conditions:  Heart failure Lung disease An unusual or allergic reaction to trastuzumab, other medications, foods, dyes, or preservatives Pregnant or trying to get pregnant Breast-feeding How should I use this medication? This medication is injected into a vein. It is given by your care team in a hospital or clinic setting. Talk to your  care team about the use of this medication in children. It is not approved for use in children. Overdosage: If you think you have taken too much of this medicine contact a poison control center or emergency room at once. NOTE: This medicine is only for you. Do not share this medicine with others. What if I miss a dose? Keep appointments for follow-up doses. It is important not to miss your dose. Call your care team if you are unable to keep an appointment. What may interact with this medication? Certain types of chemotherapy, such as daunorubicin, doxorubicin, epirubicin, idarubicin This list may not describe all possible interactions. Give your health care provider a list of all the medicines, herbs, non-prescription drugs, or dietary supplements you use. Also tell them if you smoke, drink alcohol, or use illegal drugs. Some items may interact with your medicine. What should I watch for while using this medication? Your condition will be monitored carefully while you are receiving this medication. This medication may make you feel generally unwell. This is not uncommon, as chemotherapy affects healthy cells as well as cancer cells. Report any side effects. Continue your course of treatment even though you feel ill unless your care team tells you to stop. This medication may increase your risk of getting an infection. Call your care team for advice if you get a fever, chills, sore throat, or other symptoms of a cold or flu. Do not treat yourself. Try to avoid being around people who are sick. Avoid taking medications that contain aspirin, acetaminophen, ibuprofen, naproxen, or ketoprofen unless instructed by your care team. These medications can hide a fever. Talk to your care team if you may be pregnant. Serious birth defects can occur if you take this medication during pregnancy and for 7 months after the last dose. You will need a negative pregnancy test before starting this medication. Contraception  is recommended while taking this medication and for 7 months after the last dose. Your care team can help you find the option that works for you. Do not breastfeed while taking this medication and for 7 months after stopping treatment. What side effects may I notice from receiving this medication? Side effects that you should report to your care team as soon as possible: Allergic reactions or angioedema--skin rash, itching or hives, swelling of the face, eyes, lips, tongue, arms, or legs, trouble swallowing or breathing Dry cough, shortness of breath or trouble breathing Heart failure--shortness of breath, swelling of the ankles, feet, or hands, sudden weight gain, unusual weakness or fatigue Infection--fever, chills, cough, or sore throat Infusion reactions--chest pain, shortness of breath or trouble breathing, feeling faint or lightheaded Side effects that usually do not require medical attention (report to your care team if they continue or are bothersome): Diarrhea Dizziness Headache Nausea Trouble sleeping Vomiting This list may not describe all possible side effects. Call your doctor for medical advice about side effects. You may report side effects to FDA at 1-800-FDA-1088. Where should I keep my medication? This medication is given in a hospital or clinic. It will not be stored at home. NOTE: This sheet is a summary. It may not cover all possible information. If you  have questions about this medicine, talk to your doctor, pharmacist, or health care provider.  2024 Elsevier/Gold Standard (2022-04-12 00:00:00)  Pertuzumab Injection What is this medication? PERTUZUMAB (per TOOZ ue mab) treats breast cancer. It works by blocking a protein that causes cancer cells to grow and multiply. This helps to slow or stop the spread of cancer cells. It is a monoclonal antibody. This medicine may be used for other purposes; ask your health care provider or pharmacist if you have questions. COMMON  BRAND NAME(S): PERJETA What should I tell my care team before I take this medication? They need to know if you have any of these conditions: Heart failure An unusual or allergic reaction to pertuzumab, other medications, foods, dyes, or preservatives Pregnant or trying to get pregnant Breast-feeding How should I use this medication? This medication is injected into a vein. It is given by your care team in a hospital or clinic setting. Talk to your care team about the use of this medication in children. Special care may be needed. Overdosage: If you think you have taken too much of this medicine contact a poison control center or emergency room at once. NOTE: This medicine is only for you. Do not share this medicine with others. What if I miss a dose? Keep appointments for follow-up doses. It is important not to miss your dose. Call your care team if you are unable to keep an appointment. What may interact with this medication? Interactions are not expected. This list may not describe all possible interactions. Give your health care provider a list of all the medicines, herbs, non-prescription drugs, or dietary supplements you use. Also tell them if you smoke, drink alcohol, or use illegal drugs. Some items may interact with your medicine. What should I watch for while using this medication? Your condition will be monitored carefully while you are receiving this medication. This medication may make you feel generally unwell. This is not uncommon as chemotherapy can affect healthy cells as well as cancer cells. Report any side effects. Continue your course of treatment even though you feel ill unless your care team tells you to stop. Talk to your care team if you may be pregnant. Serious birth defects can occur if you take this medication during pregnancy and for 7 months after the last dose. You will need a negative pregnancy test before starting this medication. Contraception is recommended while  taking this medication and for 7 months after the last dose. Your care team can help you find the option that works for you. Do not breastfeed while taking this medication and for 7 months after the last dose. What side effects may I notice from receiving this medication? Side effects that you should report to your care team as soon as possible: Allergic reactions or angioedema--skin rash, itching or hives, swelling of the face, eyes, lips, tongue, arms, or legs, trouble swallowing or breathing Heart failure--shortness of breath, swelling of the ankles, feet, or hands, sudden weight gain, unusual weakness or fatigue Infusion reactions--chest pain, shortness of breath or trouble breathing, feeling faint or lightheaded Side effects that usually do not require medical attention (report to your care team if they continue or are bothersome): Diarrhea Dry skin Fatigue Hair loss Nausea Vomiting This list may not describe all possible side effects. Call your doctor for medical advice about side effects. You may report side effects to FDA at 1-800-FDA-1088. Where should I keep my medication? This medication is given in a hospital or clinic. It  will not be stored at home. NOTE: This sheet is a summary. It may not cover all possible information. If you have questions about this medicine, talk to your doctor, pharmacist, or health care provider.  2024 Elsevier/Gold Standard (2022-04-12 00:00:00)

## 2024-02-02 NOTE — Assessment & Plan Note (Signed)
She does not tolerate oral potassium.  Potassium is stable. 

## 2024-02-02 NOTE — Assessment & Plan Note (Signed)
Continue calcium supplementation 600mg  TID.

## 2024-02-02 NOTE — Assessment & Plan Note (Addendum)
No bone lesions on Nov 2024 PET.  01/03/24 MRI lumbar w wo contrast showed no bony metastatic disease.

## 2024-02-02 NOTE — Assessment & Plan Note (Addendum)
Recurrent breast cancer with isolated bone metastasis, stage IV #Initially stage IIIA right breast cancer, ER positive, HER-2 negative status post bilateral oophorectomy,Right mastectomy and right axillary lymph node dissection, status post implant, and implant removal.  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 ER positive, HER-2 positive  .- s/p Palliative radiation to T7   10/20/23 PET NED,10/17/23 normal bone density on DEXA Labs reviewed and discussed Proceed with Kanjinti Perjecta.   Continue fulvestrant monthly.

## 2024-02-02 NOTE — Assessment & Plan Note (Signed)
Chronic tachycardia Stable low-normal LVEF 50-55%. Follow up with cardiology. She gets ECHO Q3 months. 

## 2024-02-02 NOTE — Assessment & Plan Note (Signed)
Follow up with  rheumatology.  Patient is on methotrexate,  plaquenil.  

## 2024-02-02 NOTE — Assessment & Plan Note (Addendum)
status post radiation. Continue Zometa monthly, hold if Calcium is <8.6  - calcium was held last week due to hypocalcemia.  Continue calcium supplementation. Continue oxycodone as needed.

## 2024-02-02 NOTE — Assessment & Plan Note (Signed)
Antibody treatment plan as list above. 

## 2024-02-02 NOTE — Progress Notes (Signed)
Hematology/Oncology Progress note [phone] [phone]    REASON FOR VISIT Follow up for  treatment of breast cancer   ASSESSMENT & PLAN:   Cancer Staging  Breast cancer of upper-outer quadrant of right female breast (HCC) Staging form: Breast, AJCC 8th Edition - Clinical: G2, ER+, PR+, HER2- - Signed by [name], MD on 04/05/2018 - Pathologic stage from 04/04/2018: No Stage Recommended (ypT3, pN2, cM0, G3, ER+, PR-, HER2-) - Signed by [name], MD on 04/04/2018 - Pathologic: Stage IV (rpTX, pNX, cM1, ER+, PR-, HER2+) - Signed by [name], MD on 12/10/2020   Breast cancer of upper-outer quadrant of right female breast (HCC) Recurrent breast cancer with isolated bone metastasis, stage IV #Initially stage IIIA right breast cancer, ER positive, HER-2 negative status post bilateral oophorectomy,Right mastectomy and right axillary lymph node dissection, status post implant, and implant removal.  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 ER positive, HER-2 positive  .- s/p Palliative radiation to T7   10/20/23 PET NED,10/17/23 normal bone density on DEXA Labs reviewed and discussed Proceed with Kanjinti Perjecta.   Continue fulvestrant monthly.  Encounter for monoclonal antibody treatment for malignancy Antibody treatment plan as list above.  Hypocalcemia Continue calcium supplementation 600mg  TID.    Hypokalemia She does not tolerate oral potassium.  Potassium is stable.  Low back pain radiating to right lower extremity No bone lesions on Nov 2024 PET.  01/03/24 MRI lumbar w wo contrast showed no bony metastatic disease.    Malignant neoplasm metastatic to bone [facility]) status post radiation. Continue Zometa monthly, hold if Calcium is <8.6  - calcium was held last week due to hypocalcemia.  Continue calcium supplementation. Continue oxycodone as needed.    Nonischemic  cardiomyopathy (HCC) Chronic tachycardia Stable low-normal LVEF 50-55%. Follow up with cardiology. She gets ECHO Q3 months.  Rheumatoid arthritis (HCC) Follow up with  rheumatology.  Patient is on methotrexate,  plaquenil.    Patient declines influenza vaccination.   Orders Placed This Encounter  Procedures   Comprehensive metabolic panel    Standing Status:   Future    Expected Date:   02/23/2024    Expiration Date:   02/22/2025   CBC with Differential    Standing Status:   Future    Expected Date:   02/23/2024    Expiration Date:   02/22/2025   Comprehensive metabolic panel    Standing Status:   Future    Expected Date:   03/15/2024    Expiration Date:   03/15/2025   CBC with Differential    Standing Status:   Future    Expected Date:   03/15/2024    Expiration Date:   03/15/2025   Follow up  lab Kanjinti Perjecta 3 weeks.  lab MD Kanjinti Perjecta 6 weeks.  fulvestrant Q 4 weeks   All questions were answered. The patient knows to call the clinic with any problems, questions or concerns.  [name], MD, PhD [facility] Hematology Oncology 02/02/2024       Oncology History  Oncology History Overview Note        Malignant neoplasm of overlapping sites of right breast in female, estrogen receptor positive (HCC)  Breast cancer of upper-outer quadrant of right female breast (HCC)  10/19/2017 Initial Diagnosis   Malignant neoplasm of overlapping sites of right breast in female, estrogen receptor positive (HCC)   11/08/2017 - 01/03/2018 Chemotherapy   Neoadjuvant ddAC x 4 +  1 cycle of Taxol  due to lack of response, surgery was offered.   03/19/2018 Surgery   S/p right mastectomy and right axillary dissection, immediate breast reconstruction with placement of expanders.  ypT3 ypN2, + lymphovascular invasion,  Grade 3, margin is negative, close. ER 90%, PR 0%, HER2 IHC negative.   # elective bilateral salpingo-oophorectomy..    06/11/2018 Imaging   s/p 11 cycles Taxol  adjuvantly   10/10/2018 -  Radiation Therapy   adjuvant right chest wall radiation   06/03/2019 Surgery   underwent elective left prophylactic mastectomy and sentinel lymph node biopsy of left axilla Pathology negative for malignancy  # Mediport removal  # right implant removal on    07/07/2020 Imaging   MRI thoracic spine without contrast showed lesions involving the T7 posterior elements most concerning for metastatic lesion.  No evidence of epidural tumor.  Minimal thoracic spondylosis without stenosis. MRI was reviewed by me and a PET scan was obtained for further evaluation   07/20/2020 Imaging   PET scan showed hypermetabolic metastasis involving the posterior element of T7, no additional evidence of metastasis in the neck, chest, abdomen or pelvis.   07/29/2020 Procedure   T7 lesion biopsy showed metastatic carcinoma, compatible with breast origin.  Receptor status staining showed ER 71-80% positive, PR negative, HER-2 positive IHC 3+   08/31/2020 -  Radiation Therapy    finished spine radiation    09/17/2020 -  Chemotherapy   Patient is on Treatment Plan :  BREAST Trastuzumab + Pertuzumab q21d     12/10/2020 Cancer Staging   Staging form: Breast, AJCC 8th Edition - Pathologic: Stage IV (rpTX, pNX, cM1, ER+, PR-, HER2+) - Signed by [name], MD on 12/10/2020   03/15/2021 Imaging    PET scan showed no focal hypermetabolic activity to suggest skeletal metastasis.  Mild hypermetabolic activity along the right T7-8 paraspinal musculature.  Max SUV 3.3.  Likely postprocedural-   09/28/2021 Echocardiogram   further decrease of LVEF to 45%   10/14/2021 Imaging   PET  Post bilateral mastectomy and RIGHT axillary dissection without signs of recurrent or metastatic disease.   11/12/2021 Echocardiogram   LVEF of 45-50%.   02/18/2022 Imaging   MRI thoracic and lumbar spine w wo contrast  1. Negative MRIs of the thoracic and lumbar spine. No evidence for locally recurrent metastasis at  the level of T7. No other new metastatic disease elsewhere with thoracolumbar spine. 2. No significant disc pathology, stenosis, or evidence for neural impingement.    02/25/2022 Echocardiogram   LVEF 55-60%.   03/16/2022 Imaging   PET showed Stable PET-CT. No findings for local recurrent breast cancer,locoregional adenopathy or distant metastatic disease.    07/20/2022 Imaging   MRI Thoracic Spine w wo contrast 1. Interval resolution of the increased T2 signal contrast enhancement in the posterior elements of T7. No new lesion identified. 2. No spinal canal or neural foraminal stenosis.   08/23/2022 Echocardiogram   Stable low-normal LVEF at 50-55%.   10/05/2022 Imaging   PET restaging Stable examination without evidence of hypermetabolic recurrence or metastatic disease.   Persistent minimally metabolic stranding in the posterior bilateral gluteal soft tissues may reflect sequela of subcutaneous injections   02/24/2023 Echocardiogram   Echocardiogram showed stable low normal LVEF 50-55%.   04/07/2023 Imaging   PET scan  1. Status post bilateral mastectomies without evidence of hypermetabolic local recurrence. 2. No evidence of hypermetabolic metastatic disease. 3. Asymmetric right-sided tonsillar FDG avidity is nonspecific consider further evaluation  with direct visualization. 4. Gas fluid levels in the proximal colon may reflect a diarrheal illness or laxative use.      INTERVAL HISTORY 43 yo female with above oncology history reviewed by me presents for follow-up of management of metastatic Triple positive breast cancer.  Chronic back pain, unchanged.  Neck/upper back pain for 1 week Intermittent diarrhea, manageable.  No fever or chills. + arthralgia + right lower back pain since Nov/Dec, sometimes radiate to right lower extremity. She plans to establish with neurosurgeon.  +MRSA infection of lowe extremity, on a course of doxycycline     Review of Systems   Constitutional:  Negative for chills, fever, malaise/fatigue and weight loss.  HENT:  Negative for sore throat.        Hair loss  Eyes:  Negative for redness.  Respiratory:  Negative for cough, shortness of breath and wheezing.   Cardiovascular:  Negative for chest pain, palpitations and leg swelling.  Gastrointestinal:  Negative for abdominal pain, blood in stool, heartburn, nausea and vomiting.  Genitourinary:  Negative for dysuria.  Musculoskeletal:  Positive for back pain and joint pain.  Skin:  Negative for rash.  Neurological:  Positive for tingling. Negative for dizziness and tremors.  Endo/Heme/Allergies:  Does not bruise/bleed easily.  Psychiatric/Behavioral:  Negative for hallucinations. The patient does not have insomnia.     No Known Allergies  Patient Active Problem List   Diagnosis Date Noted   Breast cancer of upper-outer quadrant of right female breast (HCC) 03/19/2018    Priority: High   Low back pain radiating to right lower extremity 12/22/2023    Priority: Medium    Normocytic anemia 06/23/2023    Priority: Medium    Hypokalemia 06/03/2022    Priority: Medium    Malignant neoplasm metastatic to bone (HCC) 03/25/2021    Priority: Medium    Encounter for monoclonal antibody treatment for malignancy 12/10/2020    Priority: Medium    Hypocalcemia 11/19/2020    Priority: Medium    Neuropathy due to chemotherapeutic drug (HCC) 08/11/2020    Priority: Medium    Rheumatoid arthritis (HCC) 10/13/2019    Priority: Medium    Nonischemic cardiomyopathy (HCC) 08/23/2018    Priority: Medium    Folliculitis and perifolliculitis 04/14/2023    Priority: Low   Abnormal bowel movement 04/14/2023    Priority: Low   Right shoulder pain 01/20/2023    Priority: Low   Thyroid nodule 06/04/2022    Priority: Low   Encounter for antineoplastic chemotherapy 10/29/2020    Priority: Low   Goals of care, counseling/discussion 08/11/2020    Priority: Low   Gastroesophageal  reflux disease without esophagitis 02/24/2017    Priority: Low   Malignant tumor of breast (HCC) 07/11/2023   Stress fracture of metatarsal bone of right foot 07/11/2023   Change in bowel habits 06/23/2023   Closed fracture of fifth metatarsal bone 05/09/2023   Tachycardia, unspecified 04/15/2021   Bone lesion 11/19/2020   Inflammatory arthritis 11/19/2020   HER2-positive carcinoma of breast (HCC) 08/11/2020   Bilateral hand swelling 10/03/2019   Fracture of neck of metacarpal bone 05/14/2019   Chronic fatigue 04/09/2019   Polyarthralgia 04/09/2019   Status post right breast reconstruction 02/26/2019   Status post right mastectomy 02/26/2019   Mastalgia 02/15/2019   Shortness of breath 08/23/2018   Preprocedural cardiovascular examination 08/23/2018   Tachycardia 08/23/2018   Palpitations 08/23/2018   Estrogen receptor positive status (ER+) 04/04/2018   Acquired absence of right breast and  nipple 04/03/2018   Family history of cancer    Malignant neoplasm of overlapping sites of right breast in female, estrogen receptor positive (HCC) 10/19/2017   Generalized anxiety disorder 10/03/2014   Headache 10/03/2014     Past Medical History:  Diagnosis Date   Anemia    Arthritis    BRCA negative 11/26/2017   Breast cancer (HCC) 10/11/2017   Multifocal, ER positive, PR negative, HER-2 negative. ypT3 ypN2a 8.7 cm, 4/15 nodes   Cardiomyopathy (HCC)    a. 10/2017 Echo: EF 60-65%, no rwma, Gr1 DD, nl RV size/fxn; b. 04/2018 Echo: EF 55-60%, no rwma, Nl RV size/fxn; c. 08/2018 Echo: EF 45%, diff HK, ? HK of antsept wall. Gr1 DD. Mild MR. Mild LAE/RAE. Mod dil RV.    Chronic bronchitis (HCC) 11/2017   COPD (chronic obstructive pulmonary disease) (HCC)    MILD PER CXR   Depression    Family history of cancer    GERD (gastroesophageal reflux disease)    Headache    MIGRAINES   Heart murmur    ASYMPTOMATIC   Personal history of chemotherapy    current for right breast ca     Past  Surgical History:  Procedure Laterality Date   AXILLARY LYMPH NODE DISSECTION Right 03/19/2018   Procedure: AXILLARY LYMPH NODE DISSECTION;  Surgeon: Earline Mayotte, MD;  Location: [facility] ORS;  Service: General;  Laterality: Right;   BIOPSY  06/23/2023   Procedure: BIOPSY;  Surgeon: Wyline Mood, MD;  Location: [facility] ENDOSCOPY;  Service: Gastroenterology;;   BREAST BIOPSY Right 10/11/2017   12:30 posterior coil clip invasive mammary carcinoma   BREAST BIOPSY Right 10/11/2017   11:30 middle depth ribbon clip DCIS   BREAST BIOPSY Right 10/11/2017   5:30 anterior depth x shape invasive ductal carcinoma   BREAST IMPLANT REMOVAL Right 06/03/2019   Procedure: REMOVAL OF RIGHT BREAST IMPLANTS;  Surgeon: Peggye Form, DO;  Location: [facility] ORS;  Service: Plastics;  Laterality: Right;   BREAST RECONSTRUCTION WITH PLACEMENT OF TISSUE EXPANDER AND FLEX HD (ACELLULAR HYDRATED DERMIS) Right 03/19/2018   Procedure: BREAST RECONSTRUCTION WITH PLACEMENT OF TISSUE EXPANDER AND FLEX HD (ACELLULAR HYDRATED DERMIS);  Surgeon: Peggye Form, DO;  Location: [facility];  Service: Plastics;  Laterality: Right;   CARPAL TUNNEL RELEASE Bilateral 2020   CHOLECYSTECTOMY N/A 04/27/2020   Procedure: LAPAROSCOPIC CHOLECYSTECTOMY WITH INTRAOPERATIVE CHOLANGIOGRAM;  Surgeon: Earline Mayotte, MD;  Location: [facility];  Service: General;  Laterality: N/A;   COLONOSCOPY WITH PROPOFOL N/A 06/23/2023   Procedure: COLONOSCOPY WITH PROPOFOL;  Surgeon: Wyline Mood, MD;  Location: [facility] ENDOSCOPY;  Service: Gastroenterology;  Laterality: N/A;   ESOPHAGOGASTRODUODENOSCOPY (EGD) WITH PROPOFOL N/A 04/17/2020   Procedure: ESOPHAGOGASTRODUODENOSCOPY (EGD) WITH PROPOFOL;  Surgeon: Earline Mayotte, MD;  Location: [facility] ENDOSCOPY;  Service: Endoscopy;  Laterality: N/A;  with biopsy   ESOPHAGOGASTRODUODENOSCOPY (EGD) WITH PROPOFOL N/A 06/23/2023   Procedure: ESOPHAGOGASTRODUODENOSCOPY (EGD) WITH PROPOFOL;  Surgeon: Wyline Mood, MD;   Location[facility]COPY;  Service: Gastroenterology;  Laterality: N/A;   LAPAROSCOPIC BILATERAL SALPINGO OOPHERECTOMY Bilateral 03/19/2018   Procedure: LAPAROSCOPIC BILATERAL SALPINGO OOPHORECTOMY;  Surgeon: Christeen Douglas, MD;  Location: [facility]S;  Service: Gynecology;  Laterality: Bilateral;   MASTECTOMY Right 03/2018   MASTECTOMY W/ SENTINEL NODE BIOPSY Right 03/19/2018   Procedure: MASTECTOMY WITH SENTINEL LYMPH NODE BIOPSY;  Surgeon: Earline Mayotte, MD;  Location: [facility];  Service: General;  Laterality: Right;   PORT-A-CATH REMOVAL Left 06/03/2019   Procedure: REMOVAL PORT-A-CATH;  Surgeon: Donnalee Curry  W, MD;  Location: [facility];  Service: General;  Laterality: Left;   PORTACATH PLACEMENT Left 10/24/2017   Procedure: INSERTION PORT-A-CATH;  Surgeon: Earline Mayotte, MD;  Location: [facility];  Service: General;  Laterality: Left;   PORTACATH PLACEMENT Right 09/28/2020   Procedure: INSERTION PORT-A-CATH;  Surgeon: Earline Mayotte, MD;  Location: [facility];  Service: General;  Laterality: Right;   REMOVAL OF TISSUE EXPANDER AND PLACEMENT OF IMPLANT Right 07/20/2018   Procedure: REMOVAL OF RIGHT BREAST TISSUE EXPANDER AND PLACEMENT OF IMPLANT;  Surgeon: Peggye Form, DO;  Location: [facility];  Service: Plastics;  Laterality: Right;   SIMPLE MASTECTOMY WITH AXILLARY SENTINEL NODE BIOPSY Left 06/03/2019   Procedure: SIMPLE MASTECTOMY LEFT;  Surgeon: Earline Mayotte, MD;  Location: [facility];  Service: General;  Laterality: Left;    Social History   Socioeconomic History   Marital status: Married    Spouse name: Not on file   Number of children: Not on file   Years of education: Not on file   Highest education level: Not on file  Occupational History   Occupation: pharmacy tech    Comment: Acupuncturist [employer]   Tobacco Use   Smoking status: Former    Current packs/day: 0.00    Average packs/day: 0.5 packs/day for 18.0 years (9.0 ttl  pk-yrs)    Types: Cigarettes    Start date: 06/21/2018    Quit date: 12/11/2022    Years since quitting: 1.1   Smokeless tobacco: Never  Vaping Use   Vaping status: Never Used  Substance and Sexual Activity   Alcohol use: No   Drug use: No   Sexual activity: Yes    Birth control/protection: Injection, Other-see comments    Comment: has had hysterectomy  Other Topics Concern   Not on file  Social History Narrative   Lives at home with husband and daughter   Social Drivers of Corporate investment banker Strain: Not on file  Food Insecurity: No Food Insecurity (12/25/2023)   Hunger Vital Sign    Worried About Running Out of Food in the Last Year: Never true    Ran Out of Food in the Last Year: Never true  Transportation Needs: No Transportation Needs (12/25/2023)   PRAPARE - Administrator, Civil Service (Medical): No    Lack of Transportation (Non-Medical): No  Physical Activity: Not on file  Stress: Not on file  Social Connections: Not on file  Intimate Partner Violence: Not on file     Family History  Problem Relation Age of Onset   Diabetes Father    Hypertension Father    Hyperlipidemia Father    Heart attack Father 45       "mild"   Melanoma Maternal Aunt        other aunts with BCC/SCC/Melanoma   Cervical cancer Maternal Aunt 70       daughter w/ cervical cancer as well   Lung cancer Maternal Aunt    Melanoma Maternal Uncle        other uncles with BCC/SCC/Melanoma   Breast cancer Paternal Aunt    Bladder Cancer Maternal Grandmother   Biological mother had Grave's disease.    Current Outpatient Medications:    acetaminophen (TYLENOL) 500 MG tablet, Take 500 mg by mouth every 6 (six) hours as needed., Disp: , Rfl:    albuterol (VENTOLIN HFA) 108 (90 Base) MCG/ACT inhaler, Inhale 2 puffs into the lungs every 6 (  six) hours as needed for wheezing or shortness of breath. , Disp: , Rfl:    CALCIUM CARBONATE-VITAMIN D PO, Take 600-800 mg by mouth in the  morning, at noon, and at bedtime., Disp: , Rfl:    chlorhexidine (PERIDEX) 0.12 % solution, Use as directed 15 mLs in the mouth or throat 2 (two) times daily., Disp: 120 mL, Rfl: 0   cyanocobalamin (,VITAMIN B-12,) 1000 MCG/ML injection, Inject 1,000 mcg into the skin every 30 (thirty) days., Disp: , Rfl:    diphenoxylate-atropine (LOMOTIL) 2.5-0.025 MG tablet, Take 1 tablet by mouth 4 (four) times daily as needed for diarrhea or loose stools., Disp: 60 tablet, Rfl: 0   doxycycline (ADOXA) 100 MG tablet, Take by mouth., Disp: , Rfl:    escitalopram (LEXAPRO) 20 MG tablet, Take 20 mg by mouth daily., Disp: , Rfl:    esomeprazole (NEXIUM) 40 MG capsule, Take 40 mg by mouth daily before breakfast. , Disp: , Rfl:    Eszopiclone 3 MG TABS, Take 3 mg by mouth at bedtime as needed (sleep). , Disp: , Rfl:    folic acid (FOLVITE) 1 MG tablet, Take 1 mg by mouth daily., Disp: , Rfl:    hydrocortisone 2.5 % cream, , Disp: , Rfl:    hydroxychloroquine (PLAQUENIL) 200 MG tablet, Take 200 mg by mouth daily., Disp: , Rfl:    hydrOXYzine (ATARAX) 10 MG tablet, Take 1 tablet (10 mg total) by mouth every 6 (six) hours as needed., Disp: 60 tablet, Rfl: 0   ibuprofen (ADVIL) 800 MG tablet, Take 800 mg by mouth every 8 (eight) hours as needed for moderate pain., Disp: , Rfl:    loperamide (IMODIUM) 2 MG capsule, Take 1 tablet (2 mg total) by mouth See admin instructions. Take 2 tablets with onset of diarrhea, then take 1 tablet every 2 hours until diarrhea stops. Maximum 8 tablets in 24, Disp: 90 capsule, Rfl: 0   loratadine (CLARITIN) 10 MG tablet, Take 10 mg by mouth daily. , Disp: , Rfl:    LORazepam (ATIVAN) 1 MG tablet, Take 1 mg by mouth 3 (three) times daily., Disp: , Rfl:    Magnesium 500 MG TABS, Take 500 mg by mouth 2 (two) times daily., Disp: , Rfl:    methocarbamol (ROBAXIN) 500 MG tablet, Take by mouth., Disp: , Rfl:    methotrexate (RHEUMATREX) 2.5 MG tablet, Take 25 mg by mouth every Sunday. 10 tablets  once a week, Disp: , Rfl:    metoprolol tartrate (LOPRESSOR) 25 MG tablet, Take 0.5 tablets (12.5 mg total) by mouth as needed., Disp: 30 tablet, Rfl: 0   mupirocin ointment (BACTROBAN) 2 %, Place 1 application into the nose 2 (two) times daily. Use in each nostril twice daily for five (5) days., Disp: 22 g, Rfl: 5   nortriptyline (PAMELOR) 10 MG capsule, Take 30 mg by mouth at bedtime. , Disp: , Rfl:    oxyCODONE (OXY IR/ROXICODONE) 5 MG immediate release tablet, Take 1 tablet (5 mg total) by mouth every 6 (six) hours as needed for moderate pain (pain score 4-6) or severe pain (pain score 7-10)., Disp: 90 tablet, Rfl: 0   phentermine (ADIPEX-P) 37.5 MG tablet, Take 37.5 mg by mouth daily before breakfast. , Disp: , Rfl:    pregabalin (LYRICA) 150 MG capsule, Take 150 mg by mouth 2 (two) times daily., Disp: , Rfl:    promethazine (PHENERGAN) 25 MG tablet, Take 1 tablet (25 mg total) by mouth every 8 (eight) hours as needed  for nausea or vomiting., Disp: 90 tablet, Rfl: 0   pyridOXINE (VITAMIN B-6) 100 MG tablet, Take 100 mg by mouth daily., Disp: , Rfl:    topiramate (TOPAMAX) 50 MG tablet, Take 50 mg by mouth 2 (two) times daily. , Disp: , Rfl:    vitamin C (ASCORBIC ACID) 500 MG tablet, Take 500 mg by mouth daily., Disp: , Rfl:  No current facility-administered medications for this visit.  Facility-Administered Medications Ordered in Other Visits:    heparin lock flush 100 unit/mL, 500 Units, Intravenous, Once, [name], MD   pertuzumab (PERJETA) 420 mg in sodium chloride 0.9 % 250 mL chemo infusion, 420 mg, Intravenous, Once, [name], MD   sodium chloride flush (NS) 0.9 % injection 10 mL, 10 mL, Intravenous, PRN, [name], MD, 10 mL at 11/19/18 1249   sodium chloride flush (NS) 0.9 % injection 10 mL, 10 mL, Intravenous, Once, [name], MD   trastuzumab-[facility]) 462 mg in sodium chloride 0.9 % 250 mL chemo infusion, 6 mg/kg (Treatment Plan Recorded), Intravenous, Once, [name],  MD   Physical exam:  Vitals:   02/02/24 0836  BP: 122/74  Pulse: (!) 108  Resp: 18  Temp: (!) 96 F (35.6 C)  SpO2: 100%  Weight: 182 lb 3.2 oz (82.6 kg)  ECOG 1 Physical Exam Constitutional:      Appearance: Normal appearance.  HENT:     Head: Normocephalic and atraumatic.  Eyes:     General: No scleral icterus. Neck:     Vascular: No JVD.  Cardiovascular:     Rate and Rhythm: Normal rate.  Pulmonary:     Effort: Pulmonary effort is normal. No respiratory distress.     Breath sounds: Normal breath sounds.  Abdominal:     General: There is no distension.     Palpations: Abdomen is soft.  Musculoskeletal:     Cervical back: Normal range of motion.  Lymphadenopathy:     Cervical: No cervical adenopathy.  Skin:    General: Skin is warm.     Findings: No erythema or rash.  Neurological:     Mental Status: She is alert and oriented to person, place, and time. Mental status is at baseline.     Cranial Nerves: No cranial nerve deficit.     Motor: No abnormal muscle tone.  Psychiatric:        Mood and Affect: Mood and affect normal.        Labs     Latest Ref Rng & Units 02/02/2024    8:07 AM  CMP  Glucose 70 - 99 mg/dL 191   BUN 6 - 20 mg/dL 16   Creatinine 4.78 - 1.00 mg/dL 2.95   Sodium 621 - 308 mmol/L 140   Potassium 3.5 - 5.1 mmol/L 3.5   Chloride 98 - 111 mmol/L 105   CO2 22 - 32 mmol/L 26   Calcium 8.9 - 10.3 mg/dL 9.0   Total Protein 6.5 - 8.1 g/dL 6.9   Total Bilirubin 0.0 - 1.2 mg/dL 0.4   Alkaline Phos 38 - 126 U/L 61   AST 15 - 41 U/L 25   ALT 0 - 44 U/L 17       Latest Ref Rng & Units 02/02/2024    8:07 AM  CBC  WBC 4.0 - 10.5 K/uL 7.3   Hemoglobin 12.0 - 15.0 g/dL 65.7   Hematocrit 84.6 - 46.0 % 32.9   Platelets 150 - 400 K/uL 339  RADIOGRAPHIC STUDIES: I have personally reviewed the radiological images as listed and agreed with the findings in the report. MR Lumbar Spine W Wo Contrast Result Date: 01/03/2024 CLINICAL DATA:   lower back pain. History of metastatic breast cancer. EXAM: MRI LUMBAR SPINE WITHOUT AND WITH CONTRAST TECHNIQUE: Multiplanar and multiecho pulse sequences of the lumbar spine were obtained without and with intravenous contrast. CONTRAST:  8mL GADAVIST GADOBUTROL 1 MMOL/ML IV SOLN COMPARISON:  CT lumbar spine 01/02/2024. MRI lumbar spine February 18, 2022. FINDINGS: Segmentation: Standard. Alignment:  Physiologic. Vertebrae: No evidence of fracture, evidence of discitis, or suspicious bone lesion. There is no pathologic enhancement. Conus medullaris and cauda equina: Conus extends to the L1 level. Conus and cauda equina appear normal. Paraspinal and other soft tissues: Negative. Disc levels: Small right paracentral disc protrusion at L5-S1. No significant canal or foraminal stenosis. IMPRESSION: 1. No evidence of bony metastatic disease in the lumbar spine. 2. No significant canal or foraminal stenosis. Electronically Signed   By: Feliberto Harts M.D.   On: 01/03/2024 15:55   CT Lumbar Spine Wo Contrast Result Date: 01/02/2024 CLINICAL DATA:  Low back pain.  History of metastatic breast cancer. EXAM: CT LUMBAR SPINE WITHOUT CONTRAST TECHNIQUE: Multidetector CT imaging of the lumbar spine was performed without intravenous contrast administration. Multiplanar CT image reconstructions were also generated. RADIATION DOSE REDUCTION: This exam was performed according to the departmental dose-optimization program which includes automated exposure control, adjustment of the mA and/or kV according to patient size and/or use of iterative reconstruction technique. COMPARISON:  Nuclear medicine PET CT 10/11/2023 FINDINGS: Segmentation: 5 lumbar type vertebrae. Alignment: Normal. Vertebrae: No acute fracture or focal pathologic process. Paraspinal and other soft tissues: Negative. Scattered calcifications in the distal abdominal aorta and iliac vessels. Disc levels: Mild disc bulge at L5-S1.  Disc spaces maintained.  IMPRESSION: No acute or focal bony abnormality. Mild broad-based disc bulge at L5-S1. Electronically Signed   By: Charlett Nose M.D.   On: 01/02/2024 19:15   ECHOCARDIOGRAM LIMITED Result Date: 11/28/2023    ECHOCARDIOGRAM LIMITED REPORT   Patient Name:   GWENDOLEN HEWLETT Date of Exam: 11/28/2023 Medical Rec #:  [id number]      Height:       67.0 in Accession #:    [id number]     Weight:       180.1 lb Date of Birth:  03-17-1981      BSA:          1.934 m Patient Age:    42 years       BP:           118/78 mmHg Patient Gender: F              HR:           110 bpm. Exam Location:  [facility] Procedure: 2D Echo, Cardiac Doppler, Limited Color Doppler and Intracardiac            Opacification Agent Indications:    Z51.11 Encounter for antineoplastic chemotheraphy; I42.80                 Non-ischemic cardiomyopathy  History:        Patient has prior history of Echocardiogram examinations, most                 recent 08/31/2023. Cardiomyopathy, COPD, Arrythmias:Tachycardia,                 Signs/Symptoms:Shortness of Breath and Fatigue; Risk  Factors:Former Smoker.  Sonographer:    Ilda Mori MHA, BS, RDCS Referring Phys: [id number] Sondra Barges  Sonographer Comments: Technically challenging study due to limited acoustic windows. Image acquisition challenging due to mastectomy. IMPRESSIONS  1. Left ventricular ejection fraction, by estimation, is 50 to 55%. Left ventricular ejection fraction by PLAX is 50 %. The left ventricle has low normal function. The left ventricle has no regional wall motion abnormalities. The left ventricular internal cavity size was moderately dilated. Left ventricular diastolic parameters are indeterminate.  2. Right ventricular systolic function is normal. The right ventricular size is normal. Tricuspid regurgitation signal is inadequate for assessing PA pressure.  3. The mitral valve is normal in structure. Mild mitral valve regurgitation. No evidence of mitral stenosis.  4. The  aortic valve is normal in structure. Aortic valve regurgitation is not visualized. No aortic stenosis is present.  5. The inferior vena cava is normal in size with greater than 50% respiratory variability, suggesting right atrial pressure of 3 mmHg. FINDINGS  Left Ventricle: Left ventricular ejection fraction, by estimation, is 50 to 55%. Left ventricular ejection fraction by PLAX is 50 %. The left ventricle has low normal function. The left ventricle has no regional wall motion abnormalities. The left ventricular internal cavity size was moderately dilated. There is no left ventricular hypertrophy. Left ventricular diastolic parameters are indeterminate. Right Ventricle: The right ventricular size is normal. No increase in right ventricular wall thickness. Right ventricular systolic function is normal. Tricuspid regurgitation signal is inadequate for assessing PA pressure. Left Atrium: Left atrial size was normal in size. Right Atrium: Right atrial size was normal in size. Pericardium: There is no evidence of pericardial effusion. Mitral Valve: The mitral valve is normal in structure. Mild mitral valve regurgitation. No evidence of mitral valve stenosis. Tricuspid Valve: The tricuspid valve is normal in structure. Tricuspid valve regurgitation is mild . No evidence of tricuspid stenosis. Aortic Valve: The aortic valve is normal in structure. Aortic valve regurgitation is not visualized. No aortic stenosis is present. Pulmonic Valve: The pulmonic valve was normal in structure. Pulmonic valve regurgitation is not visualized. No evidence of pulmonic stenosis. Aorta: The aortic root is normal in size and structure. Venous: The inferior vena cava is normal in size with greater than 50% respiratory variability, suggesting right atrial pressure of 3 mmHg. IAS/Shunts: No atrial level shunt detected by color flow Doppler. LEFT VENTRICLE PLAX 2D LV EF:         Left ventricular ejection fraction by PLAX is 50 %. LVIDd:          5.10 cm LVIDs:         3.80 cm LV PW:         0.70 cm LV IVS:        0.70 cm  LV Volumes (MOD) LV vol d, MOD A2C: 142.0 ml LV vol d, MOD A4C: 119.0 ml LV vol s, MOD A2C: 62.9 ml LV vol s, MOD A4C: 59.7 ml LV SV MOD A2C:     79.1 ml LV SV MOD A4C:     119.0 ml LV SV MOD BP:      76.5 ml Julien Nordmann MD Electronically signed by Julien Nordmann MD Signature Date/Time: 11/28/2023/1:47:36 PM    Final

## 2024-02-03 ENCOUNTER — Other Ambulatory Visit: Payer: Self-pay

## 2024-02-06 ENCOUNTER — Other Ambulatory Visit: Payer: Self-pay

## 2024-02-14 NOTE — Progress Notes (Unsigned)
 Referring Physician:  Leanna Sato, MD [address] [place],  [place] [address]  Primary Physician:  Leanna Sato, MD  History of Present Illness: 02/15/2024 Ms. Theresa Chaney is a 43 y.o with a history of recurrent stage IV breast cancer, nonischemic cardiomyopathy, rheumatoid arthritis, who is here today with a chief complaint of acute on chronic low back pain.  She states that she is having intermittent low back pain particularly off to the right-hand side for several months which acutely worsened resulting in ER visit on 01/02/2024.  Since then her pain has improved some and is largely localized to the middle of her back and off to the left-hand side.  It is worse with standing for longer than a few minutes and walking for prolonged periods of time and improves with being still.  She has attempted conservative treatment with ibuprofen, oxycodone, and Robaxin which has not provided any significant relief.  Overall the severity of her pain has improved over the course of the last month and a half but she still has some throbbing/toothache type pain in her low back that is interfering with her activity level.  She has not undergone any conservative treatment with physical therapy.  Past Surgery: no spinal surgeries  Theresa Chaney has no symptoms of cervical myelopathy.  The symptoms are causing a significant impact on the patient's life.   Review of Systems:  A 10 point review of systems is negative, except for the pertinent positives and negatives detailed in the HPI.  Past Medical History: Past Medical History:  Diagnosis Date   Anemia    Arthritis    BRCA negative 11/26/2017   Breast cancer (HCC) 10/11/2017   Multifocal, ER positive, PR negative, HER-2 negative. ypT3 ypN2a 8.7 cm, 4/15 nodes   Cardiomyopathy (HCC)    a. 10/2017 Echo: EF 60-65%, no rwma, Gr1 DD, nl RV size/fxn; b. 04/2018 Echo: EF 55-60%, no rwma, Nl RV size/fxn; c. 08/2018 Echo: EF 45%, diff HK, ? HK of antsept  wall. Gr1 DD. Mild MR. Mild LAE/RAE. Mod dil RV.    Chronic bronchitis (HCC) 11/2017   COPD (chronic obstructive pulmonary disease) (HCC)    MILD PER CXR   Depression    Family history of cancer    GERD (gastroesophageal reflux disease)    Headache    MIGRAINES   Heart murmur    ASYMPTOMATIC   Personal history of chemotherapy    current for right breast ca    Past Surgical History: Past Surgical History:  Procedure Laterality Date   AXILLARY LYMPH NODE DISSECTION Right 03/19/2018   Procedure: AXILLARY LYMPH NODE DISSECTION;  Surgeon: Earline Mayotte, MD;  Location: [facility];  Service: General;  Laterality: Right;   BIOPSY  06/23/2023   Procedure: BIOPSY;  Surgeon: Wyline Mood, MD;  Location: [facility];  Service: Gastroenterology;;   BREAST BIOPSY Right 10/11/2017   12:30 posterior coil clip invasive mammary carcinoma   BREAST BIOPSY Right 10/11/2017   11:30 middle depth ribbon clip DCIS   BREAST BIOPSY Right 10/11/2017   5:30 anterior depth x shape invasive ductal carcinoma   BREAST IMPLANT REMOVAL Right 06/03/2019   Procedure: REMOVAL OF RIGHT BREAST IMPLANTS;  Surgeon: Peggye Form, DO;  Location: [facility];  Service: Plastics;  Laterality: Right;   BREAST RECONSTRUCTION WITH PLACEMENT OF TISSUE EXPANDER AND FLEX HD (ACELLULAR HYDRATED DERMIS) Right 03/19/2018   Procedure: BREAST RECONSTRUCTION WITH PLACEMENT OF TISSUE EXPANDER AND FLEX HD (ACELLULAR HYDRATED DERMIS);  Surgeon: Peggye Form, DO;  Location: [facility];  Service: Plastics;  Laterality: Right;   CARPAL TUNNEL RELEASE Bilateral 2020   CHOLECYSTECTOMY N/A 04/27/2020   Procedure: LAPAROSCOPIC CHOLECYSTECTOMY WITH INTRAOPERATIVE CHOLANGIOGRAM;  Surgeon: Earline Mayotte, MD;  Location: [facility];  Service: General;  Laterality: N/A;   COLONOSCOPY WITH PROPOFOL N/A 06/23/2023   Procedure: COLONOSCOPY WITH PROPOFOL;  Surgeon: Wyline Mood, MD;  Location: [facility] END[facility]COPY;  Service: Gastroenterology;   Laterality: N/A;   ESOPHAGOGASTRODUODENOSCOPY (EGD) WITH PROPOFOL N/A 04/17/2020   Procedure: ESOPHAGOGASTRODUODENOSCOPY (EGD) WITH PROPOFOL;  Surgeon: Earline Mayotte, MD;  Location: [facility];  Service: Endoscopy;  Laterality: N/A;  with biopsy   ESOPHAGOGASTRODUODENOSCOPY (EGD) WITH PROPOFOL N/A 06/23/2023   Procedure: ESOPHAGOGASTRODUODENOSCOPY (EGD) WITH PROPOFOL;  Surgeon: Wyline Mood, MD;  Location: [facility] END[facility]COPY;  Service: Gastroenterology;  Laterality: N/A;   LAPAROSCOPIC BILATERAL SALPINGO OOPHERECTOMY Bilateral 03/19/2018   Procedure: LAPAROSCOPIC BILATERAL SALPINGO OOPHORECTOMY;  Surgeon: Christeen Douglas, MD;  Location: [facility];  Service: Gynecology;  Laterality: Bilateral;   MASTECTOMY Right 03/2018   MASTECTOMY W/ SENTINEL NODE BIOPSY Right 03/19/2018   Procedure: MASTECTOMY WITH SENTINEL LYMPH NODE BIOPSY;  Surgeon: Earline Mayotte, MD;  Location: [facility];  Service: General;  Laterality: Right;   PORT-A-CATH REMOVAL Left 06/03/2019   Procedure: REMOVAL PORT-A-CATH;  Surgeon: Earline Mayotte, MD;  Location: [facility];  Service: General;  Laterality: Left;   PORTACATH PLACEMENT Left 10/24/2017   Procedure: INSERTION PORT-A-CATH;  Surgeon: Earline Mayotte, MD;  Location: [facility];  Service: General;  Laterality: Left;   PORTACATH PLACEMENT Right 09/28/2020   Procedure: INSERTION PORT-A-CATH;  Surgeon: Earline Mayotte, MD;  Location: [facility];  Service: General;  Laterality: Right;   REMOVAL OF TISSUE EXPANDER AND PLACEMENT OF IMPLANT Right 07/20/2018   Procedure: REMOVAL OF RIGHT BREAST TISSUE EXPANDER AND PLACEMENT OF IMPLANT;  Surgeon: Peggye Form, DO;  Location: [facility];  Service: Plastics;  Laterality: Right;   SIMPLE MASTECTOMY WITH AXILLARY SENTINEL NODE BIOPSY Left 06/03/2019   Procedure: SIMPLE MASTECTOMY LEFT;  Surgeon: Earline Mayotte, MD;  Location: [facility];  Service: General;  Laterality: Left;    Allergies: Allergies as of  02/15/2024   (No Known Allergies)    Medications: Outpatient Encounter Medications as of 02/15/2024  Medication Sig   acetaminophen (TYLENOL) 500 MG tablet Take 500 mg by mouth every 6 (six) hours as needed.   albuterol (VENTOLIN HFA) 108 (90 Base) MCG/ACT inhaler Inhale 2 puffs into the lungs every 6 (six) hours as needed for wheezing or shortness of breath.    CALCIUM CARBONATE-VITAMIN D PO Take 600-800 mg by mouth in the morning, at noon, and at bedtime.   chlorhexidine (PERIDEX) 0.12 % solution Use as directed 15 mLs in the mouth or throat 2 (two) times daily.   cyanocobalamin (,VITAMIN B-12,) 1000 MCG/ML injection Inject 1,000 mcg into the skin every 30 (thirty) days.   diphenoxylate-atropine (LOMOTIL) 2.5-0.025 MG tablet Take 1 tablet by mouth 4 (four) times daily as needed for diarrhea or loose stools.   escitalopram (LEXAPRO) 20 MG tablet Take 20 mg by mouth daily.   esomeprazole (NEXIUM) 40 MG capsule Take 40 mg by mouth daily before breakfast.    Eszopiclone 3 MG TABS Take 3 mg by mouth at bedtime as needed (sleep).    folic acid (FOLVITE) 1 MG tablet Take 1 mg by mouth daily.   hydrocortisone 2.5 % cream    hydroxychloroquine (PLAQUENIL) 200 MG tablet Take  200 mg by mouth daily.   hydrOXYzine (ATARAX) 10 MG tablet Take 1 tablet (10 mg total) by mouth every 6 (six) hours as needed.   ibuprofen (ADVIL) 800 MG tablet Take 800 mg by mouth every 8 (eight) hours as needed for moderate pain.   loperamide (IMODIUM) 2 MG capsule Take 1 tablet (2 mg total) by mouth See admin instructions. Take 2 tablets with onset of diarrhea, then take 1 tablet every 2 hours until diarrhea stops. Maximum 8 tablets in 24   loratadine (CLARITIN) 10 MG tablet Take 10 mg by mouth daily.    LORazepam (ATIVAN) 1 MG tablet Take 1 mg by mouth 3 (three) times daily.   Magnesium 500 MG TABS Take 500 mg by mouth 2 (two) times daily.   methocarbamol (ROBAXIN) 500 MG tablet Take by mouth.   methotrexate (RHEUMATREX) 2.5  MG tablet Take 25 mg by mouth every Sunday. 10 tablets once a week   metoprolol tartrate (LOPRESSOR) 25 MG tablet Take 0.5 tablets (12.5 mg total) by mouth as needed.   mupirocin ointment (BACTROBAN) 2 % Place 1 application into the nose 2 (two) times daily. Use in each nostril twice daily for five (5) days.   nortriptyline (PAMELOR) 10 MG capsule Take 30 mg by mouth at bedtime.    oxyCODONE (OXY IR/ROXICODONE) 5 MG immediate release tablet Take 1 tablet (5 mg total) by mouth every 6 (six) hours as needed for moderate pain (pain score 4-6) or severe pain (pain score 7-10).   phentermine (ADIPEX-P) 37.5 MG tablet Take 37.5 mg by mouth daily before breakfast.    pregabalin (LYRICA) 150 MG capsule Take 150 mg by mouth 2 (two) times daily.   promethazine (PHENERGAN) 25 MG tablet Take 1 tablet (25 mg total) by mouth every 8 (eight) hours as needed for nausea or vomiting.   pyridOXINE (VITAMIN B-6) 100 MG tablet Take 100 mg by mouth daily.   topiramate (TOPAMAX) 50 MG tablet Take 50 mg by mouth 2 (two) times daily.    vitamin C (ASCORBIC ACID) 500 MG tablet Take 500 mg by mouth daily.   Facility-Administered Encounter Medications as of 02/15/2024  Medication   heparin lock flush 100 unit/mL   sodium chloride flush (NS) 0.9 % injection 10 mL   sodium chloride flush (NS) 0.9 % injection 10 mL    Social History: Social History   Tobacco Use   Smoking status: Former    Current packs/day: 0.00    Average packs/day: 0.5 packs/day for 18.0 years (9.0 ttl pk-yrs)    Types: Cigarettes    Start date: 06/21/2018    Quit date: 12/11/2022    Years since quitting: 1.1   Smokeless tobacco: Never  Vaping Use   Vaping status: Never Used  Substance Use Topics   Alcohol use: No   Drug use: No    Family Medical History: Family History  Problem Relation Age of Onset   Diabetes Father    Hypertension Father    Hyperlipidemia Father    Heart attack Father 29       "mild"   Melanoma Maternal Aunt         other aunts with BCC/SCC/Melanoma   Cervical cancer Maternal Aunt 47       daughter w/ cervical cancer as well   Lung cancer Maternal Aunt    Melanoma Maternal Uncle        other uncles with BCC/SCC/Melanoma   Breast cancer Paternal Aunt    Bladder Cancer Maternal  Grandmother     Physical Examination: Today's Vitals   02/15/24 1355  BP: 126/82  Weight: 82.6 kg  Height: 5\' 7"  (1.702 m)  PainSc: 0-No pain  PainLoc: Back   Body mass index is 28.51 kg/m.   General: Patient is well developed, well nourished, calm, collected, and in no apparent distress. Attention to examination is appropriate.  Psychiatric: Patient is non-anxious.  Head:  Pupils equal, round, and reactive to light.  ENT:  Oral mucosa appears well hydrated.  Neck:   Supple.    Respiratory: Patient is breathing without any difficulty.  Extremities: No edema.  Vascular: Palpable dorsal pedal pulses.  Skin:   On exposed skin, there are no abnormal skin lesions.  NEUROLOGICAL:     Awake, alert, oriented to person, place, and time.  Speech is clear and fluent. Fund of knowledge is appropriate.   Cranial Nerves: Pupils equal round and reactive to light.  Facial tone is symmetric.  Facial sensation is symmetric.  ROM of spine: full.  Palpation of spine: non tender.    Strength: Side Biceps Triceps Deltoid Interossei Grip Wrist Ext. Wrist Flex.  R 5 5 5 5 5 5 5   L 5 5 5 5 5 5 5    Side Iliopsoas Quads Hamstring PF DF EHL  R 5 5 5 5 5 5   L 5 5 5 5 5 5    Reflexes are 2+ and symmetric at the biceps, triceps, brachioradialis, patella and achilles.   Hoffman's is absent.  Clonus is not present.  Toes are down-going.  Bilateral upper and lower extremity sensation is intact to light touch.    Gait is normal.    Medical Decision Making  Imaging: 01/03/24 MRI L spine FINDINGS: Segmentation: Standard.   Alignment:  Physiologic.   Vertebrae: No evidence of fracture, evidence of discitis, or suspicious  bone lesion. There is no pathologic enhancement.   Conus medullaris and cauda equina: Conus extends to the L1 level. Conus and cauda equina appear normal.   Paraspinal and other soft tissues: Negative.   Disc levels: Small right paracentral disc protrusion at L5-S1. No significant canal or foraminal stenosis.   IMPRESSION: 1. No evidence of bony metastatic disease in the lumbar spine. 2. No significant canal or foraminal stenosis.     Electronically Signed   By: Feliberto Harts M.D.   On: 01/03/2024 15:55  I have personally reviewed the images and agree with the above interpretation.  Assessment and Plan: Ms. Otoole is a pleasant 43 y.o. female with acute on chronic low back pain without radiculopathy.  Despite ongoing symptoms she feels her symptoms have improved significantly since her ER visit in January.  We discussed her MRI in detail and she does have degenerative disc disease particularly at L5-S1 which may be contributing to her symptoms.  I recommended continued conservative treatment and offered her referral to physical therapy however given the of her other appointments she would like to start with home exercises.  I gave her a printout of our home exercise program.  I will follow-up with her via telephone visit in 6 to 8 weeks to evaluate her progress.  Should she continue to have symptoms that are unimproved we will move forward with a formal referral to physical therapy.  We briefly discussed considering injections should she fail to improve with therapy efforts.  She was encouraged to call the office in the interim with any questions or concerns.  She expressed understanding and was in agreement with this  plan.   Thank you for involving me in the care of this patient.   I spent a total of 30 minutes in both face-to-face and non-face-to-face activities for this visit on the date of this encounter.   [facility]

## 2024-02-15 ENCOUNTER — Ambulatory Visit: Payer: Medicare HMO | Admitting: Neurosurgery

## 2024-02-15 ENCOUNTER — Encounter: Payer: Self-pay | Admitting: Oncology

## 2024-02-15 ENCOUNTER — Encounter: Payer: Self-pay | Admitting: Neurosurgery

## 2024-02-15 VITALS — BP 126/82 | Ht 67.0 in | Wt 182.0 lb

## 2024-02-15 DIAGNOSIS — M5126 Other intervertebral disc displacement, lumbar region: Secondary | ICD-10-CM

## 2024-02-16 ENCOUNTER — Other Ambulatory Visit: Payer: Self-pay

## 2024-02-23 ENCOUNTER — Inpatient Hospital Stay: Payer: Medicare HMO

## 2024-02-23 ENCOUNTER — Inpatient Hospital Stay: Payer: Medicare HMO | Attending: Oncology

## 2024-02-23 VITALS — BP 140/86 | HR 90 | Temp 98.0°F | Resp 16

## 2024-02-23 DIAGNOSIS — Z95828 Presence of other vascular implants and grafts: Secondary | ICD-10-CM

## 2024-02-23 DIAGNOSIS — Z170 Estrogen receptor positive status [ER+]: Secondary | ICD-10-CM | POA: Insufficient documentation

## 2024-02-23 DIAGNOSIS — C50411 Malignant neoplasm of upper-outer quadrant of right female breast: Secondary | ICD-10-CM | POA: Diagnosis present

## 2024-02-23 DIAGNOSIS — Z5112 Encounter for antineoplastic immunotherapy: Secondary | ICD-10-CM | POA: Diagnosis present

## 2024-02-23 DIAGNOSIS — C50811 Malignant neoplasm of overlapping sites of right female breast: Secondary | ICD-10-CM

## 2024-02-23 LAB — CBC WITH DIFFERENTIAL/PLATELET
Abs Immature Granulocytes: 0.01 10*3/uL (ref 0.00–0.07)
Basophils Absolute: 0 10*3/uL (ref 0.0–0.1)
Basophils Relative: 1 %
Eosinophils Absolute: 0.1 10*3/uL (ref 0.0–0.5)
Eosinophils Relative: 2 %
HCT: 32.6 % — ABNORMAL LOW (ref 36.0–46.0)
Hemoglobin: 10.6 g/dL — ABNORMAL LOW (ref 12.0–15.0)
Immature Granulocytes: 0 %
Lymphocytes Relative: 31 %
Lymphs Abs: 1.7 10*3/uL (ref 0.7–4.0)
MCH: 29 pg (ref 26.0–34.0)
MCHC: 32.5 g/dL (ref 30.0–36.0)
MCV: 89.3 fL (ref 80.0–100.0)
Monocytes Absolute: 0.5 10*3/uL (ref 0.1–1.0)
Monocytes Relative: 9 %
Neutro Abs: 3.2 10*3/uL (ref 1.7–7.7)
Neutrophils Relative %: 57 %
Platelets: 278 10*3/uL (ref 150–400)
RBC: 3.65 MIL/uL — ABNORMAL LOW (ref 3.87–5.11)
RDW: 13.5 % (ref 11.5–15.5)
WBC: 5.6 10*3/uL (ref 4.0–10.5)
nRBC: 0 % (ref 0.0–0.2)

## 2024-02-23 LAB — COMPREHENSIVE METABOLIC PANEL
ALT: 19 U/L (ref 0–44)
AST: 28 U/L (ref 15–41)
Albumin: 3.9 g/dL (ref 3.5–5.0)
Alkaline Phosphatase: 59 U/L (ref 38–126)
Anion gap: 8 (ref 5–15)
BUN: 13 mg/dL (ref 6–20)
CO2: 23 mmol/L (ref 22–32)
Calcium: 8 mg/dL — ABNORMAL LOW (ref 8.9–10.3)
Chloride: 106 mmol/L (ref 98–111)
Creatinine, Ser: 0.67 mg/dL (ref 0.44–1.00)
GFR, Estimated: >60 mL/min (ref >60)
Glucose, Bld: 116 mg/dL — ABNORMAL HIGH (ref 70–99)
Potassium: 3.5 mmol/L (ref 3.5–5.1)
Sodium: 137 mmol/L (ref 135–145)
Total Bilirubin: 0.3 mg/dL (ref 0.0–1.2)
Total Protein: 6.9 g/dL (ref 6.5–8.1)

## 2024-02-23 MED ORDER — ZOLEDRONIC ACID 4 MG/100ML IV SOLN: 4.0000 mg | Freq: Once | INTRAVENOUS | Status: DC

## 2024-02-23 MED ORDER — HEPARIN SOD (PORK) LOCK FLUSH 100 UNIT/ML IV SOLN
500.0000 [IU] | Freq: Once | INTRAVENOUS | Status: AC
  Administered 2024-02-23: 500 [IU] via INTRAVENOUS
  Filled 2024-02-23: qty 5

## 2024-02-23 MED ORDER — SODIUM CHLORIDE 0.9 % IV SOLN
INTRAVENOUS | Status: DC
  Filled 2024-02-23: qty 250

## 2024-02-23 MED ORDER — SODIUM CHLORIDE 0.9% FLUSH
10.0000 mL | Freq: Once | INTRAVENOUS | Status: AC
Start: 2024-02-23 — End: 2024-02-23
  Administered 2024-02-23: 10 mL via INTRAVENOUS
  Filled 2024-02-23: qty 10

## 2024-02-23 MED ORDER — CALCIUM GLUCONATE 10 % IV SOLN
2.0000 g | Freq: Once | INTRAVENOUS | Status: AC
  Administered 2024-02-23: 2 g via INTRAVENOUS
  Filled 2024-02-23: qty 20

## 2024-02-23 MED ORDER — FULVESTRANT 250 MG/5ML IM SOSY
500.0000 mg | PREFILLED_SYRINGE | INTRAMUSCULAR | Status: DC
  Administered 2024-02-23: 500 mg via INTRAMUSCULAR
  Filled 2024-02-23 (×2): qty 10

## 2024-02-23 NOTE — Patient Instructions (Signed)
Calcium Gluconate Injection What is this medication? CALCIUM GLUCONATE (KAL see um GLOO koe nate) increases calcium levels in your body. Calcium is a mineral that plays an important role in building strong bones and maintaining heart health. This medicine may be used for other purposes; ask your health care provider or pharmacist if you have questions. What should I tell my care team before I take this medication? They need to know if you have any of these conditions: High levels of calcium in the blood History of irregular heartbeat History of kidney stones Kidney disease Parathyroid disease An unusual or allergic reaction to calcium, other medications, foods, dyes, or preservatives Pregnant or trying to get pregnant Breast-feeding How should I use this medication? This medication is injected into a vein. It is given by your care team in a hospital or clinic setting. Talk to your care team about the use of this medication in children. While it may be given to children for selected conditions, precautions do apply. Overdosage: If you think you have taken too much of this medicine contact a poison control center or emergency room at once. NOTE: This medicine is only for you. Do not share this medicine with others. What if I miss a dose? This does not apply. What may interact with this medication? Ceftriaxone Certain diuretics Digoxin Other calcium products This list may not describe all possible interactions. Give your health care provider a list of all the medicines, herbs, non-prescription drugs, or dietary supplements you use. Also tell them if you smoke, drink alcohol, or use illegal drugs. Some items may interact with your medicine. What should I watch for while using this medication? Your condition will be monitored carefully while you are receiving this medication. You may need blood work while you are taking this medication. What side effects may I notice from receiving this  medication? Side effects that you should report to your care team as soon as possible: Allergic reactions--skin rash, itching, hives, swelling of the face, lips, tongue, or throat Fast or irregular heartbeat High calcium level--increased thirst or amount of urine, nausea, vomiting, confusion, unusual weakness or fatigue, bone pain Low blood pressure--dizziness, feeling faint or lightheaded, blurry vision Pain, redness, or irritation at injection site Side effects that usually do not require medical attention (report to your care team if they continue or are bothersome): Change in taste Flushing, mostly over the face, neck, and chest, during injection This list may not describe all possible side effects. Call your doctor for medical advice about side effects. You may report side effects to FDA at 1-800-FDA-1088. Where should I keep my medication? This medication is given in a hospital or clinic. It will not be stored at home. NOTE: This sheet is a summary. It may not cover all possible information. If you have questions about this medicine, talk to your doctor, pharmacist, or health care provider.  2024 Elsevier/Gold Standard (2022-06-20 00:00:00)

## 2024-02-25 NOTE — Progress Notes (Unsigned)
 Cardiology Office Note    Date:  02/27/2024   ID:  Theresa Chaney, DOB 1981/03/05, MRN [id number]  PCP:  Leanna Sato, MD  Cardiologist:  Yvonne Kendall, MD  Electrophysiologist:  None   Chief Complaint: Follow-up  History of Present Illness:   Theresa Chaney is a 43 y.o. female with history of recurrent breast cancer (initial diagnosis 10/19/2017 status post neoadjuvant ddAC x 4 post 1 cycle of Taxol status post right mastectomy status post 11 cycles of Taxol and radiation with prophylactic left mastectomy in 2020 with recurrence of malignancy noted on MRI of the thoracic spine in 06/2020 with hypermetabolic metastasis involving posterior elements of T7 on PET scan status post chemoradiation) complicated by a mildly reduced LVEF, WCT, paroxysmal SVT, lymphedema, COPD, anemia, migraine disorder, and GERD who presents for follow up of her cardiomyopathy.   She was previously evaluated in 10/2017 for intermittent sinus tachycardia and DOE. At that time echoes in 10/2017 and 04/2018 showed normal LV function and it was ultimately felt her symptoms were secondary to chemotherapy. In 08/2018, she experienced upper extremity swelling with repeat echo at that time showing an EF of 45% with question of anteroseptal hypokinesis and moderate right ventricular enlargement. Upon her primary cardiologist reviewing the echo, it was felt her EF was closer to 50-55%. She was placed on Toprol XL. Lexiscan Myoview in 10/2018 showed a medium defect of moderate severity present in the the basal anterior and mild anterior location, EF 55-65%. This was a very suboptimal study due to breast attenuation artifact as well as extracardiac uptake at the inferior border of the heart. There was reversible anterior wall defect suggestive of ischemia, though it was felt this was likely artifact given the defect did not extend to the apex. She was seen in the office in 11/2018 for follow up and was feeling well outside of  intermittent palpitations over the prior couple of weeks with report of heart rates of 150 bpm. Episodes would typically last a few minutes. In this setting, she wore a Zio monitor that showed the predominant rhythm was sinus with an average heart rate of 82 bpm (range 48-175 bpm). Rare PACs and PVCs were noted with a single atrial run lasting 4 beats. There were no sustained arrhythmias or prolonged pauses. Patient triggered events corresponded to sinus rhythm and artifact. In this setting, her Toprol XL was increased to 25 mg. She underwent echo on 02/19/2019 which showed improvement in her EF to 55-60%, normal diastolic function, and no significant valvular abnormalities. She was seen in the office in 02/2019 for presyncope. Repeat cardiac monitoring at that time showed a predominant rhythm of sinus with an average heart rate of 88 bpm (range 47-181 bpm in sinus), 2 episodes of WCT favored to be NSVT over SVT with aberrancy lasting up to 7 beats, and rare PACs/PVCs. She has continued to undergo periodic echoes for chemotherapy evaluation as outlined below.    She has undergone periodic echocardiograms for chemotherapy monitoring which have demonstrated:      Echo in 10/2017 demonstrated an EF of 60 to 65%, normal wall motion, grade 1 diastolic dysfunction, normal RV systolic function, normal PASP, and no significant valvular abnormalities.   Echo in 04/2018 showed an EF of 55 to 60%, normal wall motion, normal LV diastolic function.   Echo in 08/2018 showed an EF of 45%, diffuse hypokinesis, grade 1 diastolic dysfunction, mild mitral regurgitation, mild biatrial enlargement, and moderately dilated RV.   Echo in  01/2020 demonstrated an EF of 60 to 65%, no regional wall motion abnormalities, normal RV systolic function and ventricular cavity size, and no significant valvular abnormalities.     Echo in 09/2020 continues to show a preserved LV systolic function with an EF of 60 to 65%, grade 1 diastolic  dysfunction, normal RV systolic function and ventricular cavity size, and no significant valvular abnormalities.     Echo in 01/2021 demonstrated an EF of 55-60%, no RWMA, normal RV systolic function and ventricular cavity size, and no significant valvular abnormalities.    Echo in 04/2021 showed an EF of 55 to 60%, no regional wall motion abnormalities, normal RV systolic function and ventricular cavity size, and no significant valvular abnormalities.   Echo in 08/2021 showed a low normal LV systolic function with an EF of 50 to 55%, normal LV diastolic function parameters, normal RV systolic function and ventricular cavity size, and no significant valvular abnormalities.     Echo from 09/28/2021 demonstrated an EF of 45%, global hypokinesis, normal RV systolic function and ventricular cavity size, and trivial mitral regurgitation.     Following noted drop in LV systolic function, trastuzumab and pertuzumab were held.  She was seen in follow-up in 09/2021 with relative hypotension precluding escalation of GDMT.  She underwent repeat echo in 11/2021 which demonstrated an EF of 45 to 50%, normal RV systolic function and ventricular cavity size, and no significant valvular abnormalities.  Given persistent mild LV dysfunction, Toprol was decreased to 12.5 mg and she was initiated on losartan 12.5 mg.  Subsequent repeat echo on 02/25/2022 demonstrated an improvement in her LV systolic function with an EF of 55 to 60%, no regional wall motion abnormalities, grade 1 diastolic dysfunction, normal RV systolic function and ventricular cavity size, no significant valvular abnormalities, and an estimated right atrial pressure of 3 mmHg.     Echo in 05/2022 showed an EF of 50 to 55%, no regional wall motion abnormalities, normal RV systolic function and ventricular cavity size, no significant valvular abnormalities, and an estimated right atrial pressure of 3 mmHg.   With noted lightheadedness/dizziness was  subsequently recommended she hold losartan in 06/2022.   Echo from 08/2022 55 to 55%, no regional wall motion abnormalities, grade 1 diastolic dysfunction, normal RV systolic function and ventricular cavity size, trivial mitral regurgitation, and an estimated right atrial pressure of 3 mmHg.   Echo from 11/2022 showed an EF of 50-55%, no regional wall motion abnormalities, indeterminate diastolic function, normal RV systolic function and ventricular cavity size, no significant valvular abnormalities, and an estimated right atrial pressure of 3 mmHg.   With ongoing fatigue and positional dizziness, Toprol-XL was held in 11/2022.  Given stability in LVEF, different of rechallenge an ARB/beta-blocker was deferred.   Echo from 02/2023 showed an EF of 50 to 55%, no regional wall motion abnormalities, normal LV diastolic function parameters, normal RV systolic function and ventricular cavity size, no significant valvular abnormalities, and an estimated right atrial pressure of 3 mmHg.   Echo from 05/2023 showed an EF of 50 to 55%, no regional wall motion normalities, normal RV systolic function and ventricular cavity size, and no significant valvular abnormalities.   Echo from 08/2023 showed an EF of 45 to 50% with primary cardiologist review indicating echo was unchanged with an EF of 50 to 55%, global hypokinesis, normal RV systolic function and ventricular cavity size, no significant valvular abnormalities, and an estimated right atrial pressure of 3 mmHg.  Echo from 11/2023 showed  an EF of 50 to 55%, no regional wall motion rise, moderately dilated LV internal cavity size, normal RV systolic function and ventricular cavity size, mild mitral regurgitation, and an estimated right atrial pressure of 3 mmHg.   Most recent PET scan from 10/2023 was without evidence of hypermetabolic local recurrence or hypermetabolic metastatic disease.   She comes in continuing to do well from a cardiac perspective and is  without symptoms of angina or cardiac decompensation.  Since she was last seen she has noted a couple episodes of longer lasting tachypalpitations, lasting several minutes.  She did not need any as needed metoprolol for these episodes.  No associated dizziness, presyncope, or syncope.  She is remaining active, though is limited by back pain in the setting of herniated disc between L5 and S1.  No lower extremity swelling or progressive orthopnea.  Overall feels well from a cardiac perspective.   Labs independently reviewed: 02/2024 - potassium 3.5, BUN 13, serum creatinine 0.67, albumin 3.9, AST/ALT normal, Hgb 10.6, PLT 278 03/2023 - magnesium 2.1 01/2023 - TSH normal  Past Medical History:  Diagnosis Date   Anemia    Arthritis    BRCA negative 11/26/2017   Breast cancer (HCC) 10/11/2017   Multifocal, ER positive, PR negative, HER-2 negative. ypT3 ypN2a 8.7 cm, 4/15 nodes   Cardiomyopathy (HCC)    a. 10/2017 Echo: EF 60-65%, no rwma, Gr1 DD, nl RV size/fxn; b. 04/2018 Echo: EF 55-60%, no rwma, Nl RV size/fxn; c. 08/2018 Echo: EF 45%, diff HK, ? HK of antsept wall. Gr1 DD. Mild MR. Mild LAE/RAE. Mod dil RV.    Chronic bronchitis (HCC) 11/2017   COPD (chronic obstructive pulmonary disease) (HCC)    MILD PER CXR   Depression    Family history of cancer    GERD (gastroesophageal reflux disease)    Headache    MIGRAINES   Heart murmur    ASYMPTOMATIC   Personal history of chemotherapy    current for right breast ca    Past Surgical History:  Procedure Laterality Date   AXILLARY LYMPH NODE DISSECTION Right 03/19/2018   Procedure: AXILLARY LYMPH NODE DISSECTION;  Surgeon: Earline Mayotte, MD;  Location: [facility] ORS;  Service: General;  Laterality: Right;   BIOPSY  06/23/2023   Procedure: BIOPSY;  Surgeon: Wyline Mood, MD;  Location: [facility] ENDOSCOPY;  Service: Gastroenterology;;   BREAST BIOPSY Right 10/11/2017   12:30 posterior coil clip invasive mammary carcinoma   BREAST BIOPSY Right  10/11/2017   11:30 middle depth ribbon clip DCIS   BREAST BIOPSY Right 10/11/2017   5:30 anterior depth x shape invasive ductal carcinoma   BREAST IMPLANT REMOVAL Right 06/03/2019   Procedure: REMOVAL OF RIGHT BREAST IMPLANTS;  Surgeon: Peggye Form, DO;  Location: [facility];  Service: Plastics;  Laterality: Right;   BREAST RECONSTRUCTION WITH PLACEMENT OF TISSUE EXPANDER AND FLEX HD (ACELLULAR HYDRATED DERMIS) Right 03/19/2018   Procedure: BREAST RECONSTRUCTION WITH PLACEMENT OF TISSUE EXPANDER AND FLEX HD (ACELLULAR HYDRATED DERMIS);  Surgeon: Peggye Form, DO;  Location: [facility];  Service: Plastics;  Laterality: Right;   CARPAL TUNNEL RELEASE Bilateral 2020   CHOLECYSTECTOMY N/A 04/27/2020   Procedure: LAPAROSCOPIC CHOLECYSTECTOMY WITH INTRAOPERATIVE CHOLANGIOGRAM;  Surgeon: Earline Mayotte, MD;  Location: [facility];  Service: General;  Laterality: N/A;   COLONOSCOPY WITH PROPOFOL N/A 06/23/2023   Procedure: COLONOSCOPY WITH PROPOFOL;  Surgeon: Wyline Mood, MD;  Location: [facility] ENDOSCOPY;  Service: Gastroenterology;  Laterality: N/A;   ESOPHAGOGASTRODUODENOSCOPY (EGD) WITH  PROPOFOL N/A 04/17/2020   Procedure: ESOPHAGOGASTRODUODENOSCOPY (EGD) WITH PROPOFOL;  Surgeon: Earline Mayotte, MD;  Location: [facility] ENDOSCOPY;  Service: Endoscopy;  Laterality: N/A;  with biopsy   ESOPHAGOGASTRODUODENOSCOPY (EGD) WITH PROPOFOL N/A 06/23/2023   Procedure: ESOPHAGOGASTRODUODENOSCOPY (EGD) WITH PROPOFOL;  Surgeon: Wyline Mood, MD;  Location: [facility] ENDOSCOPY;  Service: Gastroenterology;  Laterality: N/A;   LAPAROSCOPIC BILATERAL SALPINGO OOPHERECTOMY Bilateral 03/19/2018   Procedure: LAPAROSCOPIC BILATERAL SALPINGO OOPHORECTOMY;  Surgeon: Christeen Douglas, MD;  Location: [facility] OR[facility];  Service: Gynecology;  Laterality: Bilateral;   MASTECTOMY Right 03/2018   MASTECTOMY W/ SENTINEL NODE BIOPSY Right 03/19/2018   Procedure: MASTECTOMY WITH SENTINEL LYMPH NODE BIOPSY;  Surgeon: Earline Mayotte, MD;   Location: [facility];  Service: General;  Laterality: Right;   PORT-A-CATH REMOVAL Left 06/03/2019   Procedure: REMOVAL PORT-A-CATH;  Surgeon: Earline Mayotte, MD;  Location: [facility];  Service: General;  Laterality: Left;   PORTACATH PLACEMENT Left 10/24/2017   Procedure: INSERTION PORT-A-CATH;  Surgeon: Earline Mayotte, MD;  Location: [facility];  Service: General;  Laterality: Left;   PORTACATH PLACEMENT Right 09/28/2020   Procedure: INSERTION PORT-A-CATH;  Surgeon: Earline Mayotte, MD;  Location: [facility];  Service: General;  Laterality: Right;   REMOVAL OF TISSUE EXPANDER AND PLACEMENT OF IMPLANT Right 07/20/2018   Procedure: REMOVAL OF RIGHT BREAST TISSUE EXPANDER AND PLACEMENT OF IMPLANT;  Surgeon: Peggye Form, DO;  Location: [facility];  Service: Plastics;  Laterality: Right;   SIMPLE MASTECTOMY WITH AXILLARY SENTINEL NODE BIOPSY Left 06/03/2019   Procedure: SIMPLE MASTECTOMY LEFT;  Surgeon: Earline Mayotte, MD;  Location: [facility];  Service: General;  Laterality: Left;    Current Medications: Current Meds  Medication Sig   acetaminophen (TYLENOL) 500 MG tablet Take 500 mg by mouth every 6 (six) hours as needed.   albuterol (VENTOLIN HFA) 108 (90 Base) MCG/ACT inhaler Inhale 2 puffs into the lungs every 6 (six) hours as needed for wheezing or shortness of breath.    CALCIUM CARBONATE-VITAMIN D PO Take 600-800 mg by mouth in the morning, at noon, and at bedtime.   chlorhexidine (PERIDEX) 0.12 % solution Use as directed 15 mLs in the mouth or throat 2 (two) times daily.   cyanocobalamin (,VITAMIN B-12,) 1000 MCG/ML injection Inject 1,000 mcg into the skin every 30 (thirty) days.   diphenoxylate-atropine (LOMOTIL) 2.5-0.025 MG tablet Take 1 tablet by mouth 4 (four) times daily as needed for diarrhea or loose stools.   escitalopram (LEXAPRO) 20 MG tablet Take 20 mg by mouth daily.   esomeprazole (NEXIUM) 40 MG capsule Take 40 mg by mouth daily before breakfast.     Eszopiclone 3 MG TABS Take 3 mg by mouth at bedtime as needed (sleep).    folic acid (FOLVITE) 1 MG tablet Take 1 mg by mouth daily.   hydrocortisone 2.5 % cream    hydroxychloroquine (PLAQUENIL) 200 MG tablet Take 200 mg by mouth daily.   hydrOXYzine (ATARAX) 10 MG tablet Take 1 tablet (10 mg total) by mouth every 6 (six) hours as needed.   ibuprofen (ADVIL) 800 MG tablet Take 800 mg by mouth every 8 (eight) hours as needed for moderate pain.   loperamide (IMODIUM) 2 MG capsule Take 1 tablet (2 mg total) by mouth See admin instructions. Take 2 tablets with onset of diarrhea, then take 1 tablet every 2 hours until diarrhea stops. Maximum 8 tablets in 24   loratadine (CLARITIN) 10 MG tablet Take 10 mg by mouth daily.  LORazepam (ATIVAN) 1 MG tablet Take 1 mg by mouth 3 (three) times daily.   Magnesium 500 MG TABS Take 500 mg by mouth 2 (two) times daily.   methocarbamol (ROBAXIN) 500 MG tablet Take by mouth.   methotrexate (RHEUMATREX) 2.5 MG tablet Take 25 mg by mouth every Sunday. 10 tablets once a week   metoprolol tartrate (LOPRESSOR) 25 MG tablet Take 0.5 tablets (12.5 mg total) by mouth as needed.   mupirocin ointment (BACTROBAN) 2 % Place 1 application into the nose 2 (two) times daily. Use in each nostril twice daily for five (5) days.   nortriptyline (PAMELOR) 10 MG capsule Take 30 mg by mouth at bedtime.    oxyCODONE (OXY IR/ROXICODONE) 5 MG immediate release tablet Take 1 tablet (5 mg total) by mouth every 6 (six) hours as needed for moderate pain (pain score 4-6) or severe pain (pain score 7-10).   phentermine (ADIPEX-P) 37.5 MG tablet Take 37.5 mg by mouth daily before breakfast.    pregabalin (LYRICA) 150 MG capsule Take 150 mg by mouth 2 (two) times daily.   promethazine (PHENERGAN) 25 MG tablet Take 1 tablet (25 mg total) by mouth every 8 (eight) hours as needed for nausea or vomiting.   pyridOXINE (VITAMIN B-6) 100 MG tablet Take 100 mg by mouth daily.   topiramate (TOPAMAX)  50 MG tablet Take 50 mg by mouth 2 (two) times daily.    vitamin C (ASCORBIC ACID) 500 MG tablet Take 500 mg by mouth daily.    Allergies:   Patient has no known allergies.   Social History   Socioeconomic History   Marital status: Married    Spouse name: Not on file   Number of children: Not on file   Years of education: Not on file   Highest education level: Not on file  Occupational History   Occupation: pharmacy tech    Comment: Acupuncturist [employer]   Tobacco Use   Smoking status: Former    Current packs/day: 0.00    Average packs/day: 0.5 packs/day for 18.0 years (9.0 ttl pk-yrs)    Types: Cigarettes    Start date: 06/21/2018    Quit date: 12/11/2022    Years since quitting: 1.2   Smokeless tobacco: Never  Vaping Use   Vaping status: Never Used  Substance and Sexual Activity   Alcohol use: No   Drug use: No   Sexual activity: Yes    Birth control/protection: Injection, Other-see comments    Comment: has had hysterectomy  Other Topics Concern   Not on file  Social History Narrative   Lives at home with husband and daughter   Social Drivers of Corporate investment banker Strain: Not on file  Food Insecurity: No Food Insecurity (12/25/2023)   Hunger Vital Sign    Worried About Running Out of Food in the Last Year: Never true    Ran Out of Food in the Last Year: Never true  Transportation Needs: No Transportation Needs (12/25/2023)   PRAPARE - Administrator, Civil Service (Medical): No    Lack of Transportation (Non-Medical): No  Physical Activity: Not on file  Stress: Not on file  Social Connections: Not on file     Family History:  The patient's family history includes Bladder Cancer in her maternal grandmother; Breast cancer in her paternal aunt; Cervical cancer (age of onset: 48) in her maternal aunt; Diabetes in her father; Heart attack (age of onset: 43) in her father;  Hyperlipidemia in her father; Hypertension in her father;  Lung cancer in her maternal aunt; Melanoma in her maternal aunt and maternal uncle.  ROS:   12-point review of systems is negative unless otherwise noted in the HPI.   EKGs/Labs/Other Studies Reviewed:    Studies reviewed were summarized above. The additional studies were reviewed today:  Limited echo 02/27/2024: Pending __________  Limited echo 11/28/2023: 1. Left ventricular ejection fraction, by estimation, is 50 to 55%. Left  ventricular ejection fraction by PLAX is 50 %. The left ventricle has low  normal function. The left ventricle has no regional wall motion  abnormalities. The left ventricular  internal cavity size was moderately dilated. Left ventricular diastolic  parameters are indeterminate.   2. Right ventricular systolic function is normal. The right ventricular  size is normal. Tricuspid regurgitation signal is inadequate for assessing  PA pressure.   3. The mitral valve is normal in structure. Mild mitral valve  regurgitation. No evidence of mitral stenosis.   4. The aortic valve is normal in structure. Aortic valve regurgitation is  not visualized. No aortic stenosis is present.   5. The inferior vena cava is normal in size with greater than 50%  respiratory variability, suggesting right atrial pressure of 3 mmHg.  __________   Limited echo 05/30/2023: 1. Left ventricular ejection fraction, by estimation, is 50 to 55%. Left  ventricular ejection fraction by PLAX is 53 %. The left ventricle has low  normal function. The left ventricle has no regional wall motion  abnormalities. Left ventricular diastolic  parameters are indeterminate. The average left ventricular global  longitudinal strain is -13.6 %.   2. Right ventricular systolic function is normal. The right ventricular  size is normal. Tricuspid regurgitation signal is inadequate for assessing  PA pressure.   3. The mitral valve is normal in structure. No evidence of mitral valve  regurgitation.   4.  The aortic valve is normal in structure. Aortic valve regurgitation is  not visualized. No aortic stenosis is present.   5. The inferior vena cava is normal in size with greater than 50%  respiratory variability, suggesting right atrial pressure of 3 mmHg.  __________   Limited echo 02/24/2023: 1. Left ventricular ejection fraction, by estimation, is 50 to 55%. Left  ventricular ejection fraction by 3D volume is 52 %. The left ventricle has  low normal function. The left ventricle has no regional wall motion  abnormalities. Left ventricular  diastolic parameters were normal. The average left ventricular global  longitudinal strain is -18.7 %.   2. Right ventricular systolic function is normal. The right ventricular  size is normal.   3. The mitral valve is normal in structure. No evidence of mitral valve  regurgitation. No evidence of mitral stenosis.   4. The aortic valve has an indeterminant number of cusps. Aortic valve  regurgitation is not visualized. No aortic stenosis is present.   5. The inferior vena cava is normal in size with greater than 50%  respiratory variability, suggesting right atrial pressure of 3 mmHg.  __________   Limited echo 11/25/2022: 1. Left ventricular ejection fraction, by estimation, is 50 to 55%. The  left ventricle has low normal function. The left ventricle has no regional  wall motion abnormalities. Left ventricular diastolic parameters are  indeterminate. The average left  ventricular global longitudinal strain is -10.5 %. The global longitudinal  strain is abnormal.   2. Right ventricular systolic function is normal. The right ventricular  size is  normal.   3. The mitral valve is normal in structure. No evidence of mitral valve  regurgitation. No evidence of mitral stenosis.   4. The aortic valve was not well visualized. Aortic valve regurgitation  is not visualized. No aortic stenosis is present.   5. The inferior vena cava is normal in size  with greater than 50%  respiratory variability, suggesting right atrial pressure of 3 mmHg.  ___________   Limited echo 08/23/2022: 1. Left ventricular ejection fraction, by estimation, is 50 to 55%. The  left ventricle has low normal function. The left ventricle has no regional  wall motion abnormalities. Left ventricular diastolic parameters are  consistent with Grade I diastolic  dysfunction (impaired relaxation). The average left ventricular global  longitudinal strain is -15.4 %.   2. Right ventricular systolic function is normal. The right ventricular  size is normal.   3. The mitral valve is normal in structure. Trivial mitral valve  regurgitation. No evidence of mitral stenosis.   4. The aortic valve was not well visualized. Aortic valve regurgitation  is not visualized. No aortic stenosis is present.   5. The inferior vena cava is normal in size with greater than 50%  respiratory variability, suggesting right atrial pressure of 3 mmHg.  __________   Limited echo 05/17/2022: 1. Left ventricular ejection fraction, by estimation, is 50 to 55%. The  left ventricle has low normal function. The left ventricle has no regional  wall motion abnormalities. The average left ventricular global  longitudinal strain is -11.7 %.   2. Right ventricular systolic function is normal. The right ventricular  size is normal.   3. The mitral valve is normal in structure. No evidence of mitral valve  regurgitation. No evidence of mitral stenosis.   4. The aortic valve is normal in structure. Aortic valve regurgitation is  not visualized. No aortic stenosis is present.   5. The inferior vena cava is normal in size with greater than 50%  respiratory variability, suggesting right atrial pressure of 3 mmHg.   Comparison(s): 02/25/2022-EF 60%-65%.  __________   2D echo 02/25/2022: 1. Left ventricular ejection fraction, by estimation, is 55 to 60%. The  left ventricle has normal function. The left  ventricle has no regional  wall motion abnormalities. Left ventricular diastolic parameters are  consistent with Grade I diastolic  dysfunction (impaired relaxation).   2. Right ventricular systolic function is normal. The right ventricular  size is normal.   3. The mitral valve is normal in structure. No evidence of mitral valve  regurgitation. No evidence of mitral stenosis.   4. The aortic valve is normal in structure. Aortic valve regurgitation is  not visualized. No aortic stenosis is present.   5. The inferior vena cava is normal in size with greater than 50%  respiratory variability, suggesting right atrial pressure of 3 mmHg.   Comparison(s): Previous limited echo reported LVEF of 45-50%. __________   Limited echo 11/11/2021: 1. Left ventricular ejection fraction, by estimation, is 45 to 50%. Left  ventricular ejection fraction by 2D MOD biplane is 46.8 %. The left  ventricle has mildly decreased function.   2. Right ventricular systolic function is normal. The right ventricular  size is normal.   3. The mitral valve is normal in structure. No evidence of mitral valve  regurgitation. __________   2D echo 09/28/2021: 1. Left ventricular ejection fraction, by estimation, is 45%. The left  ventricle has mild to moderately decreased function. The left ventricle  demonstrates global  hypokinesis. Left ventricular diastolic parameters  were normal. The average left  ventricular global longitudinal strain is -15.0 %. The global longitudinal  strain is abnormal.   2. Right ventricular systolic function is normal. The right ventricular  size is normal. Tricuspid regurgitation signal is inadequate for assessing  PA pressure.   3. The mitral valve is normal in structure. Trivial mitral valve  regurgitation. No evidence of mitral stenosis.   4. The aortic valve is tricuspid. Aortic valve regurgitation is not  visualized. No aortic stenosis is present.   Comparison(s): A prior study  was performed on 08/31/2021. The left  ventricular function is slightly worse.  __________   2D echo 08/31/2021: 1. Left ventricular ejection fraction, by estimation, is 50 to 55%. The  left ventricle has low normal function. Left ventricular endocardial  border not optimally defined to evaluate regional wall motion. Left  ventricular diastolic parameters were  normal. The average left ventricular global longitudinal strain is -13.4  %. The global longitudinal strain is abnormal.   2. Right ventricular systolic function is normal. The right ventricular  size is normal.   3. The mitral valve is normal in structure. No evidence of mitral valve  regurgitation. No evidence of mitral stenosis.   4. The aortic valve is tricuspid. Aortic valve regurgitation is trivial.  No aortic stenosis is present.  __________   Limited 2D echo 05/05/2021: 1. Left ventricular ejection fraction, by estimation, is 55 to 60%. The  left ventricle has normal function. The left ventricle has no regional  wall motion abnormalities.   2. Right ventricular systolic function is normal. The right ventricular  size is normal.   3. The mitral valve is normal in structure. No evidence of mitral valve  regurgitation.   4. The aortic valve is tricuspid. Aortic valve regurgitation is not  visualized.  __________   Limited 2D echo 01/29/2021:  1. Left ventricular ejection fraction, by estimation, is 55 to 60%. The  left ventricle has normal function. The left ventricle has no regional  wall motion abnormalities. Left ventricular diastolic function could not  be evaluated.   2. Right ventricular systolic function is normal. The right ventricular  size is normal.   3. The mitral valve is normal in structure. Trivial mitral valve  regurgitation.   4. The aortic valve is normal in structure. Aortic valve regurgitation is  not visualized.  __________   Limited 2D echo 09/14/2020: 1. Left ventricular ejection fraction, by  estimation, is 60 to 65%. Left  ventricular ejection fraction by PLAX is 68 %. The left ventricle has  normal function. Left ventricular diastolic parameters are consistent with  Grade I diastolic dysfunction  (impaired relaxation).   2. Right ventricular systolic function is normal. The right ventricular  size is normal.   3. The mitral valve was not well visualized. No evidence of mitral valve  regurgitation.   4. Aortic valve regurgitation is not visualized.  __________   2D echo 01/29/2020: 1. Left ventricular ejection fraction, by estimation, is 60 to 65%. The  left ventricle has normal function. The left ventricle has no regional  wall motion abnormalities. Indeterminate diastolic filling due to E-A  fusion.   2. Right ventricular systolic function is normal. The right ventricular  size is normal.   3. The mitral valve is normal in structure and function. No evidence of  mitral valve regurgitation. No evidence of mitral stenosis.   4. The aortic valve is normal in structure and function. Aortic  valve  regurgitation is not visualized. No aortic stenosis is present.  __________   Luci Bank patch 02/2019: The patient was monitored for 7 days. The predominant rhythm was sinus with an average rate of 88 bpm (range 47 to 181 bpm in sinus). Rare PAC's and PVC's were noted. Two episodes of wide-complex tachycardia (favor nonsustained ventricular tachycardia over supraventricular tachycardia with aberrancy) occurred, lasting up to 7 beats. Patient triggered events correspond to normal sinus rhythm and narrow complex tachycardia (favor sinus tachycardia).   Predominantly sinus rhythm with two brief episodes of wide-complex tachycardia (favor NSVT over SVT with aberrancy).  Rare PAC's and PVC's also noted.  Patient triggered events correspond to sinus rhythm and narrow complex tachycardia (favor sinus tachycardia but cannot exclude atrial tachycardia). __________   Limited 2D echo  02/19/2019: 1. The left ventricle has normal systolic function, with an ejection  fraction of 55-60%. Left ventricular diastolic parameters were normal.   2. The right ventricle has normal systolc function. The cavity was  normal. There is no increase in right ventricular wall thickness.   3. The aortic valve was not well visualized. __________   Luci Bank patch 11/2018: The patient was monitored for 13 days, 8 hours. The predominant rhythm was sinus with an average rate of 82 bpm (range 48 to 175 bpm). Rare PACs and PVCs were noted. A single atrial run lasting 4 beats was noted. There was no sustained arrhythmia or prolonged pause. Patient triggered events correspond to sinus rhythm and artifact.   Predominantly sinus rhythm with rare PACs and PVCs.  Brief atrial run lasting 4 beats noted. __________   Eugenie Birks MPI 10/12/2018: T wave inversion was noted during stress in the III, II, aVF, V3, V2, V4, V5 and V6 leads, and returning to baseline after 1-5 mins of recovery. There was no ST segment deviation noted during stress. Defect 1: There is a medium defect of moderate severity present in the basal anterior and mid anterior location. The left ventricular ejection fraction is normal (55-65%). Very suboptimal study due to breast attenuation artifact as well as extracardiac uptake at the inferior border of the heart. There is a reversible anterior wall defect suggestive of ischemia. However, I suspect that this is likely artifact given that the defect does not extend to the apex. Correlate clinically. __________   2D echo 08/15/2018: - Left ventricle: The cavity size was mildly dilated. Systolic    function was mildly reduced. The estimated ejection fraction was    45%. Diffuse hypokinesis. Hypokinesis of the anteroseptal    myocardium. Doppler parameters are consistent with abnormal left    ventricular relaxation (grade 1 diastolic dysfunction).  - Aortic valve: Valve area (VTI): 1.88 cm^2.  Valve area (Vmax): 1.8    cm^2. Valve area (Vmean): 1.74 cm^2.  - Mitral valve: There was mild regurgitation. Valve area by    continuity equation (using LVOT flow): 2.22 cm^2.  - Left atrium: The atrium was mildly dilated.  - Right ventricle: The cavity size was moderately dilated.  - Right atrium: The atrium was mildly dilated.   Impressions:   - Mild LV systolic dysfunction with wall motion abnormalities    suggestive of CAD.  __________   2D echo 04/26/2018: - Left ventricle: The cavity size was normal. Wall thickness was    normal. Systolic function was normal. The estimated ejection    fraction was in the range of 55% to 60%. Wall motion was normal;    there were no regional wall motion abnormalities.  Left    ventricular diastolic function parameters were normal.  - Pulmonary arteries: Systolic pressure could not be accurately    estimated.  __________   2D echo 10/26/2017: - Left ventricle: The cavity size was normal. Systolic function was    normal. The estimated ejection fraction was in the range of 60%    to 65%. Wall motion was normal; there were no regional wall    motion abnormalities. Doppler parameters are consistent with    abnormal left ventricular relaxation (grade 1 diastolic    dysfunction).  - Left atrium: The atrium was normal in size.  - Right ventricle: Systolic function was normal.  - Pulmonary arteries: Systolic pressure was within the normal    range.   EKG:  EKG is ordered today.  The EKG ordered today demonstrates sinus tachycardia, 109 bpm, no acute st/t changes  Recent Labs: 03/24/2023: Magnesium 2.1 02/23/2024: ALT 19; BUN 13; Creatinine, Ser 0.67; Hemoglobin 10.6; Platelets 278; Potassium 3.5; Sodium 137  Recent Lipid Panel No results found for: "CHOL", "TRIG", "HDL", "CHOLHDL", "VLDL", "LDLCALC", "LDLDIRECT"  PHYSICAL EXAM:    VS:  BP 112/70 (BP Location: Left Arm)   Pulse (!) 109   Ht 5\' 7"  (1.702 m)   Wt 184 lb (83.5 kg)   LMP  (LMP  Unknown) Comment: Ovaries and tubes removed.April 2019  SpO2 97%   BMI 28.82 kg/m   BMI: Body mass index is 28.82 kg/m.  Physical Exam Vitals reviewed.  Constitutional:      Appearance: She is well-developed.  HENT:     Head: Normocephalic and atraumatic.  Eyes:     General:        Right eye: No discharge.        Left eye: No discharge.  Cardiovascular:     Rate and Rhythm: Regular rhythm. Tachycardia present.     Heart sounds: Normal heart sounds, S1 normal and S2 normal. Heart sounds not distant. No midsystolic click and no opening snap. No murmur heard.    No friction rub.  Pulmonary:     Effort: Pulmonary effort is normal. No respiratory distress.     Breath sounds: Normal breath sounds. No decreased breath sounds, wheezing, rhonchi or rales.  Chest:     Chest wall: No tenderness.  Musculoskeletal:     Cervical back: Normal range of motion.     Right lower leg: No edema.     Left lower leg: No edema.  Skin:    General: Skin is warm and dry.     Nails: There is no clubbing.  Neurological:     Mental Status: She is alert and oriented to person, place, and time.  Psychiatric:        Speech: Speech normal.        Behavior: Behavior normal.        Thought Content: Thought content normal.        Judgment: Judgment normal.     Wt Readings from Last 3 Encounters:  02/27/24 184 lb (83.5 kg)  02/15/24 182 lb (82.6 kg)  02/02/24 182 lb 3.2 oz (82.6 kg)     ASSESSMENT & PLAN:   Chemotherapy-induced systolic dysfunction/NICM: Euvolemic and well compensated.  Echo pending this morning.  Historically, she has had a stable LV systolic function of 50 to 55% off beta-blocker and ARB.  In the context of stable LV systolic function, relative hypotension, and lack of heart failure symptoms, we will defer rechallenge of beta-blocker or ARB at this time.  Should she have recurrence of cardiomyopathy, or develops symptoms of volume overload will look to reinitiate GDMT.  Continue active  lifestyle.  Not requiring standing loop diuretic.  Monitor echo every 3 months.  WCT/paroxysmal SVT/palpitations: She does note 2 episodes of tachypalpitations that were longer lasting, though did not need as needed metoprolol.  For now, defer addition of a scheduled beta-blocker.  Remains on as needed metoprolol.  Continue to monitor.  Recurrent prolonged cancer with isolated bone metastasis: Most recent PET scan from 10/2023 showed no evidence of local or metastatic hypermetabolic activity.  Ongoing management per oncology.     Disposition: F/u with Dr. Okey Dupre or an APP in 3 months (echo same day prior).   Medication Adjustments/Labs and Tests Ordered: Current medicines are reviewed at length with the patient today.  Concerns regarding medicines are outlined above. Medication changes, Labs and Tests ordered today are summarized above and listed in the Patient Instructions accessible in Encounters.   Signed, Eula Listen, PA-C 02/27/2024 10:19 AM     [facility] [place] [address] [place], [place] [address] [phone]

## 2024-02-26 ENCOUNTER — Other Ambulatory Visit: Payer: Medicare HMO

## 2024-02-27 ENCOUNTER — Ambulatory Visit: Payer: Medicare HMO

## 2024-02-27 ENCOUNTER — Ambulatory Visit: Attending: Physician Assistant

## 2024-02-27 ENCOUNTER — Ambulatory Visit: Payer: Medicare HMO | Attending: Physician Assistant | Admitting: Physician Assistant

## 2024-02-27 ENCOUNTER — Encounter: Payer: Self-pay | Admitting: Physician Assistant

## 2024-02-27 VITALS — BP 112/70 | HR 109 | Ht 67.0 in | Wt 184.0 lb

## 2024-02-27 DIAGNOSIS — I428 Other cardiomyopathies: Secondary | ICD-10-CM

## 2024-02-27 DIAGNOSIS — I471 Supraventricular tachycardia, unspecified: Secondary | ICD-10-CM | POA: Diagnosis not present

## 2024-02-27 DIAGNOSIS — T451X5D Adverse effect of antineoplastic and immunosuppressive drugs, subsequent encounter: Secondary | ICD-10-CM | POA: Diagnosis not present

## 2024-02-27 DIAGNOSIS — C50411 Malignant neoplasm of upper-outer quadrant of right female breast: Secondary | ICD-10-CM | POA: Diagnosis not present

## 2024-02-27 DIAGNOSIS — I427 Cardiomyopathy due to drug and external agent: Secondary | ICD-10-CM | POA: Diagnosis not present

## 2024-02-27 DIAGNOSIS — Z170 Estrogen receptor positive status [ER+]: Secondary | ICD-10-CM

## 2024-02-27 DIAGNOSIS — R000 Tachycardia, unspecified: Secondary | ICD-10-CM

## 2024-02-27 DIAGNOSIS — T451X5A Adverse effect of antineoplastic and immunosuppressive drugs, initial encounter: Secondary | ICD-10-CM

## 2024-02-27 LAB — ECHOCARDIOGRAM LIMITED
Height: 67 in
Weight: 2944 [oz_av]

## 2024-02-27 MED ORDER — PERFLUTREN LIPID MICROSPHERE
1.0000 mL | INTRAVENOUS | Status: AC | PRN
  Administered 2024-02-27: 3 mL via INTRAVENOUS

## 2024-02-27 NOTE — Patient Instructions (Addendum)
 Medication Instructions:  Your physician recommends that you continue on your current medications as directed. Please refer to the Current Medication list given to you today.  *If you need a refill on your cardiac medications before your next appointment, please call your pharmacy*   Lab Work: No labs ordered today  If you have labs (blood work) drawn today and your tests are completely normal, you will receive your results only by: MyChart Message (if you have MyChart) OR A paper copy in the mail If you have any lab test that is abnormal or we need to change your treatment, we will call you to review the results.   Testing/Procedures: Your physician has requested that you have an echocardiogram. Echocardiography is a painless test that uses sound waves to create images of your heart. It provides your doctor with information about the size and shape of your heart and how well your heart's chambers and valves are working.   You may receive an ultrasound enhancing agent through an IV if needed to better visualize your heart during the echo. This procedure takes approximately one hour.  There are no restrictions for this procedure.  This will take place at [address] [facility] [address]) [address], [place] [address]  Please note: We ask at that you not bring children with you during ultrasound (echo/ vascular) testing. Due to room size and safety concerns, children are not allowed in the ultrasound rooms during exams. Our front office staff cannot provide observation of children in our lobby area while testing is being conducted. An adult accompanying a patient to their appointment will only be allowed in the ultrasound room at the discretion of the ultrasound technician under special circumstances. We apologize for any inconvenience.    Follow-Up: At [facility], you and your health needs are our priority.  As part of our continuing mission to provide you with exceptional  heart care, we have created designated Provider Care Teams.  These Care Teams include your primary Cardiologist (physician) and Advanced Practice Providers (APPs -  Physician Assistants and Nurse Practitioners) who all work together to provide you with the care you need, when you need it.  Your next appointment:     3 month Echocardiogram   Appointment with Eula Listen P.A after Echocardiogram  Provider:   You will see one of the following Advanced Practice Providers on your designated Care Team:    Eula Listen, [place]

## 2024-02-28 ENCOUNTER — Other Ambulatory Visit: Payer: Self-pay

## 2024-02-28 ENCOUNTER — Other Ambulatory Visit: Payer: Self-pay | Admitting: Emergency Medicine

## 2024-02-28 MED ORDER — METOPROLOL SUCCINATE ER 25 MG PO TB24: 12.5000 mg | ORAL_TABLET | Freq: Every day | ORAL | 3 refills | Status: DC

## 2024-02-29 ENCOUNTER — Encounter: Payer: Self-pay | Admitting: Oncology

## 2024-02-29 NOTE — Telephone Encounter (Signed)
 Hi Dr. Cathie Hoops,  Thanks for your message.  I think it would be best to hold her HER2 therapy for now and for [place] to get her in to the advanced heart failure clinic as soon as possible for further evaluation and management, as she has not tolerated goal-directed medical therapy well due to her sort BP.  Ryan and Coffey, would you be kind enough to get her in to see one of the HF MD's at her earliest convenience?  Thanks.  Thayer Ohm

## 2024-03-01 ENCOUNTER — Telehealth: Payer: Self-pay | Admitting: Physician Assistant

## 2024-03-01 ENCOUNTER — Other Ambulatory Visit: Payer: Self-pay | Admitting: Oncology

## 2024-03-01 DIAGNOSIS — T451X5A Adverse effect of antineoplastic and immunosuppressive drugs, initial encounter: Secondary | ICD-10-CM

## 2024-03-01 NOTE — Telephone Encounter (Signed)
 Pt was returning nurse call and is requesting a callback. Please advise.

## 2024-03-01 NOTE — Addendum Note (Signed)
 Addended by: Parke Poisson on: 03/01/2024 11:40 AM   Modules accepted: Orders

## 2024-03-01 NOTE — Telephone Encounter (Signed)
 Spoke with pt who stated she was able to schedule an appointment with the HF clinic on 03/12/24

## 2024-03-01 NOTE — Telephone Encounter (Signed)
 See mychart message Referral order placed for urgent HF clinic appointment

## 2024-03-01 NOTE — Telephone Encounter (Signed)
 Left a message for the patient to call back.

## 2024-03-01 NOTE — Telephone Encounter (Signed)
 Please refer the patient to the advanced heart failure clinic to be evaluated by the physicians on that team for recurrent chemotherapy-induced cardiomyopathy/NICM with limited pharmacotherapy options.

## 2024-03-05 ENCOUNTER — Other Ambulatory Visit: Payer: Self-pay

## 2024-03-08 ENCOUNTER — Telehealth: Payer: Self-pay | Admitting: Cardiology

## 2024-03-08 NOTE — Telephone Encounter (Incomplete)
 Called to confirm/remind patient of their appointment at the Advanced Heart Failure Clinic on 03/12/24***.   Appointment:   [x] Confirmed  [] Left mess   [] No answer/No voice mail  [] Phone not in service  Patient reminded to bring all medications and/or complete list.  Confirmed patient has transportation. Gave directions, instructed to utilize valet parking.

## 2024-03-12 ENCOUNTER — Ambulatory Visit: Attending: Cardiology | Admitting: Cardiology

## 2024-03-12 VITALS — BP 118/69 | HR 95 | Wt 183.0 lb

## 2024-03-12 DIAGNOSIS — Z923 Personal history of irradiation: Secondary | ICD-10-CM | POA: Insufficient documentation

## 2024-03-12 DIAGNOSIS — M069 Rheumatoid arthritis, unspecified: Secondary | ICD-10-CM | POA: Insufficient documentation

## 2024-03-12 DIAGNOSIS — Z9221 Personal history of antineoplastic chemotherapy: Secondary | ICD-10-CM | POA: Insufficient documentation

## 2024-03-12 DIAGNOSIS — I427 Cardiomyopathy due to drug and external agent: Secondary | ICD-10-CM | POA: Diagnosis present

## 2024-03-12 DIAGNOSIS — Z9013 Acquired absence of bilateral breasts and nipples: Secondary | ICD-10-CM | POA: Diagnosis not present

## 2024-03-12 DIAGNOSIS — I428 Other cardiomyopathies: Secondary | ICD-10-CM

## 2024-03-12 DIAGNOSIS — Z79631 Long term (current) use of antimetabolite agent: Secondary | ICD-10-CM | POA: Insufficient documentation

## 2024-03-12 DIAGNOSIS — F17210 Nicotine dependence, cigarettes, uncomplicated: Secondary | ICD-10-CM | POA: Insufficient documentation

## 2024-03-12 DIAGNOSIS — Z853 Personal history of malignant neoplasm of breast: Secondary | ICD-10-CM | POA: Diagnosis not present

## 2024-03-12 DIAGNOSIS — T451X5A Adverse effect of antineoplastic and immunosuppressive drugs, initial encounter: Secondary | ICD-10-CM | POA: Diagnosis present

## 2024-03-12 DIAGNOSIS — Z79899 Other long term (current) drug therapy: Secondary | ICD-10-CM | POA: Insufficient documentation

## 2024-03-12 MED ORDER — METOPROLOL SUCCINATE ER 25 MG PO TB24: 25.0000 mg | ORAL_TABLET | Freq: Every day | ORAL | 3 refills | Status: DC

## 2024-03-12 MED ORDER — VALSARTAN 40 MG PO TABS: 20.0000 mg | ORAL_TABLET | Freq: Every day | ORAL | 3 refills | Status: DC

## 2024-03-12 NOTE — Progress Notes (Signed)
 PCP: Leanna Sato, MD Oncology: Dr. Cathie Hoops Cardiology: Dr. Okey Dupre HF Cardiology: Dr. Shirlee Latch  43 y.o. 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 Patient has breast cancer initially diagnosed in 11/28, treated with doxorubicin/cyclophosphamide x 4 cycles then Taxol x 12 cycles.  Right mastectomy + radiation.  Echo in 9/19 showed EF down to 45%, but by 9/20, EF was up to 55-60%.  Prophylactic left mastectomy in 2020. She was found to have T-spine mets in 7/21, biopsy showed ER+/HER2+.  She was started on trastuzumab.  In 10/22, echo showed EF down to 45%.  Trastuzumab was held and Toprol XL was started.  EF was up to 55-60% by 3/23.  Patient is currently on Kanjinti (trastuzumab anns) and Perjeta.  Most recent echo in 3/25 showed EF down to 35-40%, global hypokinesis, normal RV. Ernestine Mcmurray has been stopped and she was restarted on Toprol XL 12.5 mg daily (had been off this medication).  Patient has history of SVT.    Patient is short of breath walking up hills or stairs.  She has no problems walking on flat ground. Rare atypical chest pain.  Mild palpitations every week or 2, no lightheadedness or syncope.  No orthopnea/PND. She is smoking 3-4 cigarettes/day.   ECG (personally reviewed, 02/27/24): NSR, sinus tachy 109  Labs (2/25): LDL 138 Labs (3/25): K 3.5, creatinine 0.67  PMH: 1. Depression 2. GERD 3. Rheumatoid arthritis: on methotrexate, hydroxychloroquine.  4. SVT 5. Migraines 6. Breast cancer: Diagnosed 11/18, treated with doxorubicin/cyclophosphamide x 4 cycles then Taxol x 12 cycles.  Right mastectomy + radiation.  Prophylactic left mastectomy in 2020. Bilateral oophorectomy.  - T-spine mets in 7/21. Biopsy showed ER+/HER2+ - Now getting Kanjinti + Perjeta.  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  - Echo (9/19): EF 45% - Echo (9/20): EF up to 55-60%  - Echo (10/22): EF back down to 45%, trastuzumab held.  -  Echo (12/22): EF 45-50% - Echo (3/23): EF 55-60%, back on trastuzumab - Echo (3/24): EF 50-55% - Echo (6/24): EF 50-55% - Echo (9/24): EF 45-50% - Echo (12/24): EF 50-55% - Echo (3/25): EF 35-40%, global hypokinesis, normal RV => trastuzumab anns held.   FH: Father with MI in his 22s, brother with pacemaker in his 60s.   SH: Married, lives in [place], smoking 3-4 cigarettes/day  ROS: All systems reviewed and negative except as per HPI.   Current Outpatient Medications  Medication Sig Dispense Refill   acetaminophen (TYLENOL) 500 MG tablet Take 500 mg by mouth every 6 (six) hours as needed.     albuterol (VENTOLIN HFA) 108 (90 Base) MCG/ACT inhaler Inhale 2 puffs into the lungs every 6 (six) hours as needed for wheezing or shortness of breath.      CALCIUM CARBONATE-VITAMIN D PO Take 600-800 mg by mouth in the morning, at noon, and at bedtime.     chlorhexidine (PERIDEX) 0.12 % solution Use as directed 15 mLs in the mouth or throat 2 (two) times daily. 120 mL 0   cyanocobalamin (,VITAMIN B-12,) 1000 MCG/ML injection Inject 1,000 mcg into the skin every 30 (thirty) days.     diphenoxylate-atropine (LOMOTIL) 2.5-0.025 MG tablet Take 1 tablet by mouth 4 (four) times daily as needed for diarrhea or loose stools. 60 tablet 0   escitalopram (LEXAPRO) 20 MG tablet Take 20 mg by mouth daily.     esomeprazole (NEXIUM) 40 MG capsule Take 40 mg by mouth daily before breakfast.      Eszopiclone 3  MG TABS Take 3 mg by mouth at bedtime as needed (sleep).      folic acid (FOLVITE) 1 MG tablet Take 1 mg by mouth daily.     hydrocortisone 2.5 % cream      hydroxychloroquine (PLAQUENIL) 200 MG tablet Take 200 mg by mouth daily.     hydrOXYzine (ATARAX) 10 MG tablet Take 1 tablet (10 mg total) by mouth every 6 (six) hours as needed. 60 tablet 0   ibuprofen (ADVIL) 800 MG tablet Take 800 mg by mouth every 8 (eight) hours as needed for moderate pain.     loperamide (IMODIUM) 2 MG capsule Take 1 tablet (2  mg total) by mouth See admin instructions. Take 2 tablets with onset of diarrhea, then take 1 tablet every 2 hours until diarrhea stops. Maximum 8 tablets in 24 90 capsule 0   loratadine (CLARITIN) 10 MG tablet Take 10 mg by mouth daily.      LORazepam (ATIVAN) 1 MG tablet Take 1 mg by mouth 3 (three) times daily.     Magnesium 500 MG TABS Take 500 mg by mouth 2 (two) times daily.     methocarbamol (ROBAXIN) 500 MG tablet Take by mouth.     methotrexate (RHEUMATREX) 2.5 MG tablet Take 25 mg by mouth every Sunday. 10 tablets once a week     mupirocin ointment (BACTROBAN) 2 % Place 1 application into the nose 2 (two) times daily. Use in each nostril twice daily for five (5) days. 22 g 5   nortriptyline (PAMELOR) 10 MG capsule Take 30 mg by mouth at bedtime.      oxyCODONE (OXY IR/ROXICODONE) 5 MG immediate release tablet Take 1 tablet (5 mg total) by mouth every 6 (six) hours as needed for moderate pain (pain score 4-6) or severe pain (pain score 7-10). 90 tablet 0   phentermine (ADIPEX-P) 37.5 MG tablet Take 37.5 mg by mouth daily before breakfast.      pregabalin (LYRICA) 150 MG capsule Take 150 mg by mouth 2 (two) times daily.     promethazine (PHENERGAN) 25 MG tablet Take 1 tablet (25 mg total) by mouth every 8 (eight) hours as needed for nausea or vomiting. 90 tablet 0   pyridOXINE (VITAMIN B-6) 100 MG tablet Take 100 mg by mouth daily.     topiramate (TOPAMAX) 50 MG tablet Take 50 mg by mouth 2 (two) times daily.      valsartan (DIOVAN) 40 MG tablet Take 0.5 tablets (20 mg total) by mouth at bedtime. 45 tablet 3   vitamin C (ASCORBIC ACID) 500 MG tablet Take 500 mg by mouth daily.     metoprolol succinate (TOPROL XL) 25 MG 24 hr tablet Take 1 tablet (25 mg total) by mouth at bedtime. Monitor for lightheadedness and dizziness. 90 tablet 3   No current facility-administered medications for this visit.   Facility-Administered Medications Ordered in Other Visits  Medication Dose Route Frequency  Provider Last Rate Last Admin   heparin lock flush 100 unit/mL  500 Units Intravenous Once [name], MD       sodium chloride flush (NS) 0.9 % injection 10 mL  10 mL Intravenous PRN [name], MD   10 mL at 11/19/18 1249   sodium chloride flush (NS) 0.9 % injection 10 mL  10 mL Intravenous Once [name], MD       BP 118/69   Pulse 95   Wt 183 lb (83 kg)   LMP  (LMP Unknown) Comment: Ovaries  and tubes removed.April 2019  SpO2 100%   BMI 28.66 kg/m  General: NAD Neck: No JVD, no thyromegaly or thyroid nodule.  Lungs: Clear to auscultation bilaterally with normal respiratory effort. CV: Nondisplaced PMI.  Heart regular S1/S2, no S3/S4, no murmur.  No peripheral edema.  No carotid bruit.  Normal pedal pulses.  Abdomen: Soft, nontender, no hepatosplenomegaly, no distention.  Skin: Intact without lesions or rashes.  Neurologic: Alert and oriented x 3.  Psych: Normal affect. Extremities: No clubbing or cyanosis.  HEENT: Normal.   Assessment/Plan: 1. Cardiomyopathy: Echo in 3/25 with EF 35-40%, global hypokinesis, normal RV. 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  Patient had  a prior drop in EF while on Herceptin-based chemotherapy in 10/22, trastuzumab was held and Toprol XL started with increase in EF back to normal range. She stopped the Toprol XL.  Patient is now off Kanjinti (trastuzumab anns) and has restarted on Toprol XL 12.5 mg daily.  Chronic NYHA class II symptoms, not volume overloaded on exam.  - Stay off trastuzumab-based chemotherapy for now.  - Increase Toprol XL to 25 mg daily.  - Start valsartan 20 mg at bedtime (she did not tolerate losartan in the past due to low BP).  BMET/BNP in 10 days.  - I will arrange for cardiac MRI to assess for infiltrative disease, etc (is there some other cause for her cardiomyopathy other than trastuzumab?).   - Echo in 2 months.  If EF recovers to normal, consider restarting trastuzumab-related chemotherapy but will need to stay on HF  medications. If EF does not recover, may need to hold off on trastuzumab.  2. Smoking: I strongly encouraged her to completely quit.  3. Rheumatoid arthritis: On methotrexate and hydroxychloroquine. Hydroxychloroquine can cause a cardiomyopathy.   Followup with me in 1 month.   I spent 55 minutes reviewing all the old records, interviewing/examining patient, and managing orders.   Marca Ancona 03/12/2024

## 2024-03-12 NOTE — Patient Instructions (Signed)
 No Labs done today.   START Valsartan 20mg  (1/2 tablet) by mouth daily at bedtime.  INCREASE Metoprolol to 25mg  (1 tablet) by mouth daily at bedtime.   No other medication changes were made. Please continue all current medications as prescribed.  Your physician has requested that you have a cardiac MRI. Cardiac MRI uses a computer to create images of your heart as its beating, producing both still and moving pictures of your heart and major blood vessels. We will contact you at a later date to schedule an appointment.   Your physician recommends that you schedule a follow-up appointment in: 10 days for a lab only appointment, 1 month with Dr. Shirlee Latch and in 2 months for an echo  Your physician has requested that you have an echocardiogram. Echocardiography is a painless test that uses sound waves to create images of your heart. It provides your doctor with information about the size and shape of your heart and how well your heart's chambers and valves are working. This procedure takes approximately one hour. There are no restrictions for this procedure. Please do NOT wear cologne, perfume, aftershave, or lotions (deodorant is allowed). Please arrive 15 minutes prior to your appointment time.  Please note: We ask at that you not bring children with you during ultrasound (echo/ vascular) testing. Due to room size and safety concerns, children are not allowed in the ultrasound rooms during exams. Our front office staff cannot provide observation of children in our lobby area while testing is being conducted. An adult accompanying a patient to their appointment will only be allowed in the ultrasound room at the discretion of the ultrasound technician under special circumstances. We apologize for any inconvenience.  If you have any questions or concerns before your next appointment please send [place] a message through [employer] or call our office at [phone].    TO LEAVE A MESSAGE FOR THE NURSE SELECT  OPTION 2, PLEASE LEAVE A MESSAGE INCLUDING: YOUR NAME DATE OF BIRTH CALL BACK NUMBER REASON FOR CALL**this is important as we prioritize the call backs  YOU WILL RECEIVE A CALL BACK THE SAME DAY AS LONG AS YOU CALL BEFORE 4:00 PM   Do the following things EVERYDAY: Weigh yourself in the morning before breakfast. Write it down and keep it in a log. Take your medicines as prescribed Eat low salt foods--Limit salt (sodium) to 2000 mg per day.  Stay as active as you can everyday Limit all fluids for the day to less than 2 liters   At [facility], you and your health needs are our priority. As part of our continuing mission to provide you with exceptional heart care, we have created designated Provider Care Teams. These Care Teams include your primary Cardiologist (physician) and Advanced Practice Providers (APPs- Physician Assistants and Nurse Practitioners) who all work together to provide you with the care you need, when you need it.   You may see any of the following providers on your designated Care Team at your next follow up: Dr Arvilla Meres Dr Marca Ancona Dr. Marcos Eke, NP Robbie Lis, [place] [facility] [place], [place] Brynda Peon, NP Karle Plumber, PharmD   Please be sure to bring in all your medications bottles to every appointment.    Thank you for choosing  [facility]

## 2024-03-14 ENCOUNTER — Encounter: Payer: Self-pay | Admitting: Oncology

## 2024-03-15 ENCOUNTER — Ambulatory Visit: Payer: Medicare HMO

## 2024-03-15 ENCOUNTER — Ambulatory Visit: Payer: Medicare HMO | Admitting: Oncology

## 2024-03-15 ENCOUNTER — Other Ambulatory Visit: Payer: Medicare HMO

## 2024-03-21 ENCOUNTER — Other Ambulatory Visit: Payer: Self-pay

## 2024-03-21 DIAGNOSIS — Z170 Estrogen receptor positive status [ER+]: Secondary | ICD-10-CM

## 2024-03-22 ENCOUNTER — Inpatient Hospital Stay

## 2024-03-22 ENCOUNTER — Inpatient Hospital Stay (HOSPITAL_BASED_OUTPATIENT_CLINIC_OR_DEPARTMENT_OTHER): Admitting: Oncology

## 2024-03-22 ENCOUNTER — Other Ambulatory Visit: Payer: Medicare HMO

## 2024-03-22 ENCOUNTER — Inpatient Hospital Stay: Attending: Oncology

## 2024-03-22 ENCOUNTER — Ambulatory Visit: Payer: Medicare HMO

## 2024-03-22 ENCOUNTER — Encounter: Payer: Self-pay | Admitting: Oncology

## 2024-03-22 VITALS — BP 112/81 | HR 92

## 2024-03-22 VITALS — BP 120/66 | HR 101 | Temp 96.1°F | Resp 18 | Wt 183.0 lb

## 2024-03-22 DIAGNOSIS — Z7983 Long term (current) use of bisphosphonates: Secondary | ICD-10-CM | POA: Insufficient documentation

## 2024-03-22 DIAGNOSIS — I428 Other cardiomyopathies: Secondary | ICD-10-CM

## 2024-03-22 DIAGNOSIS — Z170 Estrogen receptor positive status [ER+]: Secondary | ICD-10-CM | POA: Insufficient documentation

## 2024-03-22 DIAGNOSIS — E876 Hypokalemia: Secondary | ICD-10-CM

## 2024-03-22 DIAGNOSIS — C7951 Secondary malignant neoplasm of bone: Secondary | ICD-10-CM

## 2024-03-22 DIAGNOSIS — Z5111 Encounter for antineoplastic chemotherapy: Secondary | ICD-10-CM | POA: Diagnosis present

## 2024-03-22 DIAGNOSIS — Z79899 Other long term (current) drug therapy: Secondary | ICD-10-CM | POA: Diagnosis not present

## 2024-03-22 DIAGNOSIS — M79604 Pain in right leg: Secondary | ICD-10-CM

## 2024-03-22 DIAGNOSIS — C50411 Malignant neoplasm of upper-outer quadrant of right female breast: Secondary | ICD-10-CM | POA: Diagnosis present

## 2024-03-22 DIAGNOSIS — C50811 Malignant neoplasm of overlapping sites of right female breast: Secondary | ICD-10-CM

## 2024-03-22 DIAGNOSIS — M545 Low back pain, unspecified: Secondary | ICD-10-CM

## 2024-03-22 DIAGNOSIS — M069 Rheumatoid arthritis, unspecified: Secondary | ICD-10-CM

## 2024-03-22 LAB — CBC WITH DIFFERENTIAL (CANCER CENTER ONLY)
Abs Immature Granulocytes: 0.03 10*3/uL (ref 0.00–0.07)
Basophils Absolute: 0 10*3/uL (ref 0.0–0.1)
Basophils Relative: 1 %
Eosinophils Absolute: 0.1 10*3/uL (ref 0.0–0.5)
Eosinophils Relative: 1 %
HCT: 33.6 % — ABNORMAL LOW (ref 36.0–46.0)
Hemoglobin: 11.2 g/dL — ABNORMAL LOW (ref 12.0–15.0)
Immature Granulocytes: 0 %
Lymphocytes Relative: 22 %
Lymphs Abs: 1.6 10*3/uL (ref 0.7–4.0)
MCH: 29.2 pg (ref 26.0–34.0)
MCHC: 33.3 g/dL (ref 30.0–36.0)
MCV: 87.7 fL (ref 80.0–100.0)
Monocytes Absolute: 0.6 10*3/uL (ref 0.1–1.0)
Monocytes Relative: 8 %
Neutro Abs: 5 10*3/uL (ref 1.7–7.7)
Neutrophils Relative %: 68 %
Platelet Count: 271 10*3/uL (ref 150–400)
RBC: 3.83 MIL/uL — ABNORMAL LOW (ref 3.87–5.11)
RDW: 13.4 % (ref 11.5–15.5)
WBC Count: 7.4 10*3/uL (ref 4.0–10.5)
nRBC: 0 % (ref 0.0–0.2)

## 2024-03-22 LAB — CMP (CANCER CENTER ONLY)
ALT: 18 U/L (ref 0–44)
AST: 27 U/L (ref 15–41)
Albumin: 3.9 g/dL (ref 3.5–5.0)
Alkaline Phosphatase: 66 U/L (ref 38–126)
Anion gap: 9 (ref 5–15)
BUN: 21 mg/dL — ABNORMAL HIGH (ref 6–20)
CO2: 20 mmol/L — ABNORMAL LOW (ref 22–32)
Calcium: 8.8 mg/dL — ABNORMAL LOW (ref 8.9–10.3)
Chloride: 108 mmol/L (ref 98–111)
Creatinine: 0.82 mg/dL (ref 0.44–1.00)
GFR, Estimated: >60 mL/min (ref >60)
Glucose, Bld: 112 mg/dL — ABNORMAL HIGH (ref 70–99)
Potassium: 3.4 mmol/L — ABNORMAL LOW (ref 3.5–5.1)
Sodium: 137 mmol/L (ref 135–145)
Total Bilirubin: 0.4 mg/dL (ref 0.0–1.2)
Total Protein: 7.1 g/dL (ref 6.5–8.1)

## 2024-03-22 MED ORDER — FULVESTRANT 250 MG/5ML IM SOSY
500.0000 mg | PREFILLED_SYRINGE | INTRAMUSCULAR | Status: DC
Start: 2024-03-22 — End: 2024-03-22
  Administered 2024-03-22: 500 mg via INTRAMUSCULAR
  Filled 2024-03-22: qty 10

## 2024-03-22 MED ORDER — SODIUM CHLORIDE 0.9 % IV SOLN
Freq: Once | INTRAVENOUS | Status: AC
  Filled 2024-03-22: qty 250

## 2024-03-22 MED ORDER — HEPARIN SOD (PORK) LOCK FLUSH 100 UNIT/ML IV SOLN
500.0000 [IU] | Freq: Once | INTRAVENOUS | Status: AC
  Administered 2024-03-22: 500 [IU] via INTRAVENOUS
  Filled 2024-03-22: qty 5

## 2024-03-22 MED ORDER — ZOLEDRONIC ACID 4 MG/100ML IV SOLN
4.0000 mg | Freq: Once | INTRAVENOUS | Status: AC
Start: 2024-03-22 — End: 2024-03-22
  Administered 2024-03-22: 4 mg via INTRAVENOUS
  Filled 2024-03-22: qty 100

## 2024-03-22 MED ORDER — SODIUM CHLORIDE 0.9% FLUSH
10.0000 mL | INTRAVENOUS | Status: DC | PRN
Start: 2024-03-22 — End: 2024-03-22
  Filled 2024-03-22: qty 10

## 2024-03-22 NOTE — Assessment & Plan Note (Signed)
Follow up with  rheumatology.  Patient is on methotrexate,  plaquenil.  

## 2024-03-22 NOTE — Assessment & Plan Note (Signed)
She does not tolerate oral potassium.  Potassium is stable. 

## 2024-03-22 NOTE — Assessment & Plan Note (Signed)
status post radiation. Continue Zometa monthly, hold if Calcium is <8.6 Continue calcium supplementation. Continue oxycodone as needed.   

## 2024-03-22 NOTE — Assessment & Plan Note (Signed)
 Chronic tachycardia March 2025 reduction of LVEF 35-40%. Follow up with cardiology. She gets ECHO Q3 months. Hold off anti HER 2 treatment

## 2024-03-22 NOTE — Progress Notes (Signed)
 Hematology/Oncology Progress note [phone] [phone]    REASON FOR VISIT Follow up for  treatment of breast cancer   ASSESSMENT & PLAN:   Cancer Staging  Breast cancer of upper-outer quadrant of right female breast (HCC) Staging form: Breast, AJCC 8th Edition - Clinical: G2, ER+, PR+, HER2- - Signed by Theresa Patience, MD on 04/05/2018 - Pathologic stage from 04/04/2018: No Stage Recommended (ypT3, pN2, cM0, G3, ER+, PR-, HER2-) - Signed by Theresa Patience, MD on 04/04/2018 - Pathologic: Stage IV (rpTX, pNX, cM1, ER+, PR-, HER2+) - Signed by Theresa Patience, MD on 12/10/2020   Breast cancer of upper-outer quadrant of right female breast (HCC) Recurrent breast cancer with isolated bone metastasis, stage IV #Initially stage IIIA right breast cancer, ER positive, HER-2 negative status post bilateral oophorectomy,Right mastectomy and right axillary lymph node dissection, status post implant, and implant removal.  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 ER positive, HER-2 positive  .- s/p Palliative radiation to T7   10/20/23 PET NED,10/17/23 normal bone density on DEXA Labs reviewed and discussed Hold Kanjinti Perjecta due to reduction of LVEF Obtain PET in June  Continue fulvestrant monthly.  Hypocalcemia Continue calcium supplementation 600mg  TID.    Hypokalemia She does not tolerate oral potassium.  Potassium is stable.  Low back pain radiating to right lower extremity No bone lesions on Nov 2024 PET.  01/03/24 MRI lumbar w wo contrast showed no bony metastatic disease.    Malignant neoplasm metastatic to bone [facility]) status post radiation. Continue Zometa monthly, hold if Calcium is <8.6 .  Continue calcium supplementation. Continue oxycodone as needed.    Nonischemic cardiomyopathy (HCC) Chronic tachycardia March 2025 reduction of LVEF 35-40%. Follow up with cardiology. She gets ECHO  Q3 months. Hold off anti HER 2 treatment  Rheumatoid arthritis (HCC) Follow up with  rheumatology.  Patient is on methotrexate,  plaquenil.    Patient declines influenza vaccination.   Orders Placed This Encounter  Procedures   NM PET Image Restag (PS) Skull Base To Thigh    Standing Status:   Future    Expected Date:   06/06/2024    Expiration Date:   03/22/2025    If indicated for the ordered procedure, I authorize the administration of a radiopharmaceutical per Radiology protocol:   Yes    Is the patient pregnant?:   No    Preferred imaging location?:   [facility]   Basic Metabolic Panel - Cancer Center Only    Standing Status:   Future    Expected Date:   05/17/2024    Expiration Date:   03/22/2025   Basic Metabolic Panel - Cancer Center Only    Standing Status:   Future    Expected Date:   04/19/2024    Expiration Date:   03/22/2025   Basic Metabolic Panel - Cancer Center Only    Standing Status:   Future    Expected Date:   06/14/2024    Expiration Date:   03/22/2025   Follow up  lab Kanjinti Perjecta 3 weeks.  lab MD Kanjinti Perjecta 6 weeks.  fulvestrant Q 4 weeks   All questions were answered. The patient knows to call the clinic with any problems, questions or concerns.  Theresa Patience, MD, PhD [facility] Hematology Oncology 03/22/2024       Oncology History  Oncology History Overview Note        Malignant neoplasm of overlapping sites  of right breast in female, estrogen receptor positive (HCC)  Breast cancer of upper-outer quadrant of right female breast (HCC)  10/19/2017 Initial Diagnosis   Malignant neoplasm of overlapping sites of right breast in female, estrogen receptor positive (HCC)   11/08/2017 - 01/03/2018 Chemotherapy   Neoadjuvant ddAC x 4 + 1 cycle of Taxol  due to lack of response, surgery was offered.   03/19/2018 Surgery   S/p right mastectomy and right axillary dissection, immediate breast reconstruction with placement of expanders.  ypT3  ypN2, + lymphovascular invasion,  Grade 3, margin is negative, close. ER 90%, PR 0%, HER2 IHC negative.   # elective bilateral salpingo-oophorectomy..    06/11/2018 Imaging   s/p 11 cycles Taxol adjuvantly   10/10/2018 -  Radiation Therapy   adjuvant right chest wall radiation   06/03/2019 Surgery   underwent elective left prophylactic mastectomy and sentinel lymph node biopsy of left axilla Pathology negative for malignancy  # Mediport removal  # right implant removal on    07/07/2020 Imaging   MRI thoracic spine without contrast showed lesions involving the T7 posterior elements most concerning for metastatic lesion.  No evidence of epidural tumor.  Minimal thoracic spondylosis without stenosis. MRI was reviewed by me and a PET scan was obtained for further evaluation   07/20/2020 Imaging   PET scan showed hypermetabolic metastasis involving the posterior element of T7, no additional evidence of metastasis in the neck, chest, abdomen or pelvis.   07/29/2020 Procedure   T7 lesion biopsy showed metastatic carcinoma, compatible with breast origin.  Receptor status staining showed ER 71-80% positive, PR negative, HER-2 positive IHC 3+   08/31/2020 -  Radiation Therapy    finished spine radiation    09/17/2020 -  Chemotherapy   Patient is on Treatment Plan :  BREAST Trastuzumab + Pertuzumab q21d     12/10/2020 Cancer Staging   Staging form: Breast, AJCC 8th Edition - Pathologic: Stage IV (rpTX, pNX, cM1, ER+, PR-, HER2+) - Signed by Theresa Patience, MD on 12/10/2020   03/15/2021 Imaging    PET scan showed no focal hypermetabolic activity to suggest skeletal metastasis.  Mild hypermetabolic activity along the right T7-8 paraspinal musculature.  Max SUV 3.3.  Likely postprocedural-   09/28/2021 Echocardiogram   further decrease of LVEF to 45%   10/14/2021 Imaging   PET  Post bilateral mastectomy and RIGHT axillary dissection without signs of recurrent or metastatic disease.   11/12/2021  Echocardiogram   LVEF of 45-50%.   02/18/2022 Imaging   MRI thoracic and lumbar spine w wo contrast  1. Negative MRIs of the thoracic and lumbar spine. No evidence for locally recurrent metastasis at the level of T7. No other new metastatic disease elsewhere with thoracolumbar spine. 2. No significant disc pathology, stenosis, or evidence for neural impingement.    02/25/2022 Echocardiogram   LVEF 55-60%.   03/16/2022 Imaging   PET showed Stable PET-CT. No findings for local recurrent breast cancer,locoregional adenopathy or distant metastatic disease.    07/20/2022 Imaging   MRI Thoracic Spine w wo contrast 1. Interval resolution of the increased T2 signal contrast enhancement in the posterior elements of T7. No new lesion identified. 2. No spinal canal or neural foraminal stenosis.   08/23/2022 Echocardiogram   Stable low-normal LVEF at 50-55%.   10/05/2022 Imaging   PET restaging Stable examination without evidence of hypermetabolic recurrence or metastatic disease.   Persistent minimally metabolic stranding in the posterior bilateral gluteal soft tissues may reflect sequela of  subcutaneous injections   02/24/2023 Echocardiogram   Echocardiogram showed stable low normal LVEF 50-55%.   04/07/2023 Imaging   PET scan  1. Status post bilateral mastectomies without evidence of hypermetabolic local recurrence. 2. No evidence of hypermetabolic metastatic disease. 3. Asymmetric right-sided tonsillar FDG avidity is nonspecific consider further evaluation with direct visualization. 4. Gas fluid levels in the proximal colon may reflect a diarrheal illness or laxative use.    +MRSA infection of lowe extremity, finished a course of doxycycline   INTERVAL HISTORY 43 yo female with above oncology history reviewed by me presents for follow-up of management of metastatic Triple positive breast cancer.  Chronic back pain, unchanged.  Neck/upper back pain for 1 week Intermittent diarrhea,  manageable.  No fever or chills. + arthralgia + right lower back pain, intermittent.  Off anti HER 2 treatment due to LVEF reduction on recent Echo. She follows up with cardiology. She denies lower extremity swelling.  + mild Sob with exertion.      Review of Systems  Constitutional:  Negative for chills, fever, malaise/fatigue and weight loss.  HENT:  Negative for sore throat.   Eyes:  Negative for redness.  Respiratory:  Negative for cough, shortness of breath and wheezing.   Cardiovascular:  Negative for chest pain, palpitations and leg swelling.  Gastrointestinal:  Negative for abdominal pain, blood in stool, heartburn, nausea and vomiting.  Genitourinary:  Negative for dysuria.  Musculoskeletal:  Positive for back pain and joint pain.  Skin:  Negative for rash.  Neurological:  Positive for tingling. Negative for dizziness and tremors.  Endo/Heme/Allergies:  Does not bruise/bleed easily.  Psychiatric/Behavioral:  Negative for hallucinations. The patient does not have insomnia.     No Known Allergies  Patient Active Problem List   Diagnosis Date Noted   Breast cancer of upper-outer quadrant of right female breast (HCC) 03/19/2018    Priority: High   Low back pain radiating to right lower extremity 12/22/2023    Priority: Medium    Normocytic anemia 06/23/2023    Priority: Medium    Hypokalemia 06/03/2022    Priority: Medium    Malignant neoplasm metastatic to bone (HCC) 03/25/2021    Priority: Medium    Encounter for monoclonal antibody treatment for malignancy 12/10/2020    Priority: Medium    Hypocalcemia 11/19/2020    Priority: Medium    Neuropathy due to chemotherapeutic drug (HCC) 08/11/2020    Priority: Medium    Rheumatoid arthritis (HCC) 10/13/2019    Priority: Medium    Nonischemic cardiomyopathy (HCC) 08/23/2018    Priority: Medium    Folliculitis and perifolliculitis 04/14/2023    Priority: Low   Abnormal bowel movement 04/14/2023    Priority: Low    Right shoulder pain 01/20/2023    Priority: Low   Thyroid nodule 06/04/2022    Priority: Low   Encounter for antineoplastic chemotherapy 10/29/2020    Priority: Low   Goals of care, counseling/discussion 08/11/2020    Priority: Low   Gastroesophageal reflux disease without esophagitis 02/24/2017    Priority: Low   Malignant tumor of breast (HCC) 07/11/2023   Stress fracture of metatarsal bone of right foot 07/11/2023   Change in bowel habits 06/23/2023   Closed fracture of fifth metatarsal bone 05/09/2023   Tachycardia, unspecified 04/15/2021   Bone lesion 11/19/2020   Inflammatory arthritis 11/19/2020   HER2-positive carcinoma of breast (HCC) 08/11/2020   Bilateral hand swelling 10/03/2019   Fracture of neck of metacarpal bone 05/14/2019   Chronic  fatigue 04/09/2019   Polyarthralgia 04/09/2019   Status post right breast reconstruction 02/26/2019   Status post right mastectomy 02/26/2019   Mastalgia 02/15/2019   Shortness of breath 08/23/2018   Preprocedural cardiovascular examination 08/23/2018   Tachycardia 08/23/2018   Palpitations 08/23/2018   Estrogen receptor positive status (ER+) 04/04/2018   Acquired absence of right breast and nipple 04/03/2018   Family history of cancer    Malignant neoplasm of overlapping sites of right breast in female, estrogen receptor positive (HCC) 10/19/2017   Generalized anxiety disorder 10/03/2014   Headache 10/03/2014     Past Medical History:  Diagnosis Date   Anemia    Arthritis    BRCA negative 11/26/2017   Breast cancer (HCC) 10/11/2017   Multifocal, ER positive, PR negative, HER-2 negative. ypT3 ypN2a 8.7 cm, 4/15 nodes   Cardiomyopathy (HCC)    a. 10/2017 Echo: EF 60-65%, no rwma, Gr1 DD, nl RV size/fxn; b. 04/2018 Echo: EF 55-60%, no rwma, Nl RV size/fxn; c. 08/2018 Echo: EF 45%, diff HK, ? HK of antsept wall. Gr1 DD. Mild MR. Mild LAE/RAE. Mod dil RV.    Chronic bronchitis (HCC) 11/2017   COPD (chronic obstructive pulmonary  disease) (HCC)    MILD PER CXR   Depression    Family history of cancer    GERD (gastroesophageal reflux disease)    Headache    MIGRAINES   Heart murmur    ASYMPTOMATIC   Personal history of chemotherapy    current for right breast ca     Past Surgical History:  Procedure Laterality Date   AXILLARY LYMPH NODE DISSECTION Right 03/19/2018   Procedure: AXILLARY LYMPH NODE DISSECTION;  Surgeon: Earline Mayotte, MD;  Location: [facility] ORS;  Service: General;  Laterality: Right;   BIOPSY  06/23/2023   Procedure: BIOPSY;  Surgeon: Wyline Mood, MD;  Location: [facility] ENDOSCOPY;  Service: Gastroenterology;;   BREAST BIOPSY Right 10/11/2017   12:30 posterior coil clip invasive mammary carcinoma   BREAST BIOPSY Right 10/11/2017   11:30 middle depth ribbon clip DCIS   BREAST BIOPSY Right 10/11/2017   5:30 anterior depth x shape invasive ductal carcinoma   BREAST IMPLANT REMOVAL Right 06/03/2019   Procedure: REMOVAL OF RIGHT BREAST IMPLANTS;  Surgeon: Peggye Form, DO;  Location: [facility] ORS;  Service: Plastics;  Laterality: Right;   BREAST RECONSTRUCTION WITH PLACEMENT OF TISSUE EXPANDER AND FLEX HD (ACELLULAR HYDRATED DERMIS) Right 03/19/2018   Procedure: BREAST RECONSTRUCTION WITH PLACEMENT OF TISSUE EXPANDER AND FLEX HD (ACELLULAR HYDRATED DERMIS);  Surgeon: Peggye Form, DO;  Location: [facility] ORS;  Service: Plastics;  Laterality: Right;   CARPAL TUNNEL RELEASE Bilateral 2020   CHOLECYSTECTOMY N/A 04/27/2020   Procedure: LAPAROSCOPIC CHOLECYSTECTOMY WITH INTRAOPERATIVE CHOLANGIOGRAM;  Surgeon: Earline Mayotte, MD;  Location: [facility] ORS;  Service: General;  Laterality: N/A;   COLONOSCOPY WITH PROPOFOL N/A 06/23/2023   Procedure: COLONOSCOPY WITH PROPOFOL;  Surgeon: Wyline Mood, MD;  Location: [facility] ENDOSCOPY;  Service: Gastroenterology;  Laterality: N/A;   ESOPHAGOGASTRODUODENOSCOPY (EGD) WITH PROPOFOL N/A 04/17/2020   Procedure: ESOPHAGOGASTRODUODENOSCOPY (EGD) WITH PROPOFOL;  Surgeon:  Earline Mayotte, MD;  Location: [facility] ENDOSCOPY;  Service: Endoscopy;  Laterality: N/A;  with biopsy   ESOPHAGOGASTRODUODENOSCOPY (EGD) WITH PROPOFOL N/A 06/23/2023   Procedure: ESOPHAGOGASTRODUODENOSCOPY (EGD) WITH PROPOFOL;  Surgeon: Wyline Mood, MD;  Location: [facility] ENDOSCOPY;  Service: Gastroenterology;  Laterality: N/A;   LAPAROSCOPIC BILATERAL SALPINGO OOPHERECTOMY Bilateral 03/19/2018   Procedure: LAPAROSCOPIC BILATERAL SALPINGO OOPHORECTOMY;  Surgeon: Christeen Douglas, MD;  Location: [facility]  ORS;  Service: Gynecology;  Laterality: Bilateral;   MASTECTOMY Right 03/2018   MASTECTOMY W/ SENTINEL NODE BIOPSY Right 03/19/2018   Procedure: MASTECTOMY WITH SENTINEL LYMPH NODE BIOPSY;  Surgeon: Earline Mayotte, MD;  Location: [facility] ORS;  Service: General;  Laterality: Right;   PORT-A-CATH REMOVAL Left 06/03/2019   Procedure: REMOVAL PORT-A-CATH;  Surgeon: Earline Mayotte, MD;  Location: [facility] ORS;  Service: General;  Laterality: Left;   PORTACATH PLACEMENT Left 10/24/2017   Procedure: INSERTION PORT-A-CATH;  Surgeon: Earline Mayotte, MD;  Location: [facility] ORS;  Service: General;  Laterality: Left;   PORTACATH PLACEMENT Right 09/28/2020   Procedure: INSERTION PORT-A-CATH;  Surgeon: Earline Mayotte, MD;  Location: [facility] ORS;  Service: General;  Laterality: Right;   REMOVAL OF TISSUE EXPANDER AND PLACEMENT OF IMPLANT Right 07/20/2018   Procedure: REMOVAL OF RIGHT BREAST TISSUE EXPANDER AND PLACEMENT OF IMPLANT;  Surgeon: Peggye Form, DO;  Location: [facility];  Service: Plastics;  Laterality: Right;   SIMPLE MASTECTOMY WITH AXILLARY SENTINEL NODE BIOPSY Left 06/03/2019   Procedure: SIMPLE MASTECTOMY LEFT;  Surgeon: Earline Mayotte, MD;  Location: [facility] ORS;  Service: General;  Laterality: Left;    Social History   Socioeconomic History   Marital status: Married    Spouse name: Not on file   Number of children: Not on file   Years of education: Not on file   Highest  education level: Not on file  Occupational History   Occupation: pharmacy tech    Comment: Acupuncturist [employer]   Tobacco Use   Smoking status: Former    Current packs/day: 0.00    Average packs/day: 0.5 packs/day for 18.0 years (9.0 ttl pk-yrs)    Types: Cigarettes    Start date: 06/21/2018    Quit date: 12/11/2022    Years since quitting: 1.2   Smokeless tobacco: Never  Vaping Use   Vaping status: Never Used  Substance and Sexual Activity   Alcohol use: No   Drug use: No   Sexual activity: Yes    Birth control/protection: Injection, Other-see comments    Comment: has had hysterectomy  Other Topics Concern   Not on file  Social History Narrative   Lives at home with husband and daughter   Social Drivers of Corporate investment banker Strain: Not on file  Food Insecurity: No Food Insecurity (12/25/2023)   Hunger Vital Sign    Worried About Running Out of Food in the Last Year: Never true    Ran Out of Food in the Last Year: Never true  Transportation Needs: No Transportation Needs (12/25/2023)   PRAPARE - Administrator, Civil Service (Medical): No    Lack of Transportation (Non-Medical): No  Physical Activity: Not on file  Stress: Not on file  Social Connections: Not on file  Intimate Partner Violence: Not on file     Family History  Problem Relation Age of Onset   Diabetes Father    Hypertension Father    Hyperlipidemia Father    Heart attack Father 21       "mild"   Melanoma Maternal Aunt        other aunts with BCC/SCC/Melanoma   Cervical cancer Maternal Aunt 33       daughter w/ cervical cancer as well   Lung cancer Maternal Aunt    Melanoma Maternal Uncle        other uncles with BCC/SCC/Melanoma   Breast cancer Paternal Aunt  Bladder Cancer Maternal Grandmother   Biological mother had Grave's disease.    Current Outpatient Medications:    acetaminophen (TYLENOL) 500 MG tablet, Take 500 mg by mouth every 6 (six) hours as  needed., Disp: , Rfl:    albuterol (VENTOLIN HFA) 108 (90 Base) MCG/ACT inhaler, Inhale 2 puffs into the lungs every 6 (six) hours as needed for wheezing or shortness of breath. , Disp: , Rfl:    CALCIUM CARBONATE-VITAMIN D PO, Take 600-800 mg by mouth in the morning, at noon, and at bedtime., Disp: , Rfl:    chlorhexidine (PERIDEX) 0.12 % solution, Use as directed 15 mLs in the mouth or throat 2 (two) times daily., Disp: 120 mL, Rfl: 0   cyanocobalamin (,VITAMIN B-12,) 1000 MCG/ML injection, Inject 1,000 mcg into the skin every 30 (thirty) days., Disp: , Rfl:    diphenoxylate-atropine (LOMOTIL) 2.5-0.025 MG tablet, Take 1 tablet by mouth 4 (four) times daily as needed for diarrhea or loose stools., Disp: 60 tablet, Rfl: 0   escitalopram (LEXAPRO) 20 MG tablet, Take 20 mg by mouth daily., Disp: , Rfl:    esomeprazole (NEXIUM) 40 MG capsule, Take 40 mg by mouth daily before breakfast. , Disp: , Rfl:    Eszopiclone 3 MG TABS, Take 3 mg by mouth at bedtime as needed (sleep). , Disp: , Rfl:    folic acid (FOLVITE) 1 MG tablet, Take 1 mg by mouth daily., Disp: , Rfl:    hydrocortisone 2.5 % cream, , Disp: , Rfl:    hydroxychloroquine (PLAQUENIL) 200 MG tablet, Take 200 mg by mouth daily., Disp: , Rfl:    hydrOXYzine (ATARAX) 10 MG tablet, Take 1 tablet (10 mg total) by mouth every 6 (six) hours as needed., Disp: 60 tablet, Rfl: 0   ibuprofen (ADVIL) 800 MG tablet, Take 800 mg by mouth every 8 (eight) hours as needed for moderate pain., Disp: , Rfl:    loperamide (IMODIUM) 2 MG capsule, Take 1 tablet (2 mg total) by mouth See admin instructions. Take 2 tablets with onset of diarrhea, then take 1 tablet every 2 hours until diarrhea stops. Maximum 8 tablets in 24, Disp: 90 capsule, Rfl: 0   loratadine (CLARITIN) 10 MG tablet, Take 10 mg by mouth daily. , Disp: , Rfl:    LORazepam (ATIVAN) 1 MG tablet, Take 1 mg by mouth 3 (three) times daily., Disp: , Rfl:    Magnesium 500 MG TABS, Take 500 mg by mouth 2  (two) times daily., Disp: , Rfl:    methocarbamol (ROBAXIN) 500 MG tablet, Take by mouth., Disp: , Rfl:    methotrexate (RHEUMATREX) 2.5 MG tablet, Take 25 mg by mouth every Sunday. 10 tablets once a week, Disp: , Rfl:    metoprolol succinate (TOPROL XL) 25 MG 24 hr tablet, Take 1 tablet (25 mg total) by mouth at bedtime. Monitor for lightheadedness and dizziness., Disp: 90 tablet, Rfl: 3   mupirocin ointment (BACTROBAN) 2 %, Place 1 application into the nose 2 (two) times daily. Use in each nostril twice daily for five (5) days., Disp: 22 g, Rfl: 5   nortriptyline (PAMELOR) 10 MG capsule, Take 30 mg by mouth at bedtime. , Disp: , Rfl:    oxyCODONE (OXY IR/ROXICODONE) 5 MG immediate release tablet, Take 1 tablet (5 mg total) by mouth every 6 (six) hours as needed for moderate pain (pain score 4-6) or severe pain (pain score 7-10)., Disp: 90 tablet, Rfl: 0   phentermine (ADIPEX-P) 37.5 MG tablet, Take 37.5  mg by mouth daily before breakfast. , Disp: , Rfl:    pregabalin (LYRICA) 150 MG capsule, Take 150 mg by mouth 2 (two) times daily., Disp: , Rfl:    promethazine (PHENERGAN) 25 MG tablet, Take 1 tablet (25 mg total) by mouth every 8 (eight) hours as needed for nausea or vomiting., Disp: 90 tablet, Rfl: 0   pyridOXINE (VITAMIN B-6) 100 MG tablet, Take 100 mg by mouth daily., Disp: , Rfl:    topiramate (TOPAMAX) 50 MG tablet, Take 50 mg by mouth 2 (two) times daily. , Disp: , Rfl:    valsartan (DIOVAN) 40 MG tablet, Take 0.5 tablets (20 mg total) by mouth at bedtime., Disp: 45 tablet, Rfl: 3   vitamin C (ASCORBIC ACID) 500 MG tablet, Take 500 mg by mouth daily., Disp: , Rfl:  No current facility-administered medications for this visit.  Facility-Administered Medications Ordered in Other Visits:    fulvestrant (FASLODEX) injection 500 mg, 500 mg, Intramuscular, Q30 days, Theresa Patience, MD, 500 mg at 03/22/24 0932   heparin lock flush 100 unit/mL, 500 Units, Intravenous, Once, Theresa Patience, MD   sodium  chloride flush (NS) 0.9 % injection 10 mL, 10 mL, Intravenous, PRN, Theresa Patience, MD, 10 mL at 11/19/18 1249   sodium chloride flush (NS) 0.9 % injection 10 mL, 10 mL, Intravenous, Once, Theresa Patience, MD   sodium chloride flush (NS) 0.9 % injection 10 mL, 10 mL, Intravenous, PRN, Theresa Patience, MD   Physical exam:  Vitals:   03/22/24 0844  BP: 120/66  Pulse: (!) 101  Resp: 18  Temp: (!) 96.1 F (35.6 C)  TempSrc: Tympanic  SpO2: 100%  Weight: 183 lb (83 kg)  ECOG 1 Physical Exam Constitutional:      Appearance: Normal appearance.  HENT:     Head: Normocephalic and atraumatic.  Eyes:     General: No scleral icterus. Neck:     Vascular: No JVD.  Cardiovascular:     Rate and Rhythm: Normal rate.  Pulmonary:     Effort: Pulmonary effort is normal. No respiratory distress.     Breath sounds: Normal breath sounds.  Abdominal:     General: There is no distension.     Palpations: Abdomen is soft.  Musculoskeletal:     Cervical back: Normal range of motion.  Lymphadenopathy:     Cervical: No cervical adenopathy.  Skin:    General: Skin is warm.     Findings: No erythema or rash.  Neurological:     Mental Status: She is alert and oriented to person, place, and time. Mental status is at baseline.     Cranial Nerves: No cranial nerve deficit.     Motor: No abnormal muscle tone.  Psychiatric:        Mood and Affect: Mood and affect normal.        Labs     Latest Ref Rng & Units 03/22/2024    8:08 AM  CMP  Glucose 70 - 99 mg/dL 161   BUN 6 - 20 mg/dL 21   Creatinine 0.96 - 1.00 mg/dL 0.45   Sodium 409 - 811 mmol/L 137   Potassium 3.5 - 5.1 mmol/L 3.4   Chloride 98 - 111 mmol/L 108   CO2 22 - 32 mmol/L 20   Calcium 8.9 - 10.3 mg/dL 8.8   Total Protein 6.5 - 8.1 g/dL 7.1   Total Bilirubin 0.0 - 1.2 mg/dL 0.4   Alkaline Phos 38 - 126 U/L 66   AST  15 - 41 U/L 27   ALT 0 - 44 U/L 18       Latest Ref Rng & Units 03/22/2024    8:08 AM  CBC  WBC 4.0 - 10.5 K/uL 7.4    Hemoglobin 12.0 - 15.0 g/dL 78.2   Hematocrit 95.6 - 46.0 % 33.6   Platelets 150 - 400 K/uL 271    RADIOGRAPHIC STUDIES: I have personally reviewed the radiological images as listed and agreed with the findings in the report. ECHOCARDIOGRAM LIMITED Result Date: 02/27/2024    ECHOCARDIOGRAM LIMITED REPORT   Patient Name:   Theresa Chaney Date of Exam: 02/27/2024 Medical Rec #:  [id number]      Height:       67.0 in Accession #:    [id number]     Weight:       182.0 lb Date of Birth:  1981/04/10      BSA:          1.943 m Patient Age:    43 years       BP:           112/70 mmHg Patient Gender: F              HR:           95 bpm. Exam Location:  [place] Procedure: 2D Echo, Limited Echo, Intracardiac Opacification Agent, Cardiac            Doppler and Limited Color Doppler (Both Spectral and Color Flow            Doppler were utilized during procedure). Indications:    Z51.11 Encounter for antineoplastic chemotheraphy; I42.80                 Non-ischemic cardiomyopathy  History:        Patient has prior history of Echocardiogram examinations, most                 recent 11/28/2023. Cardiomyopathy, COPD, Arrythmias:Tachycardia,                 Signs/Symptoms:Fatigue, Edema and Shortness of Breath; Risk                 Factors:Former Smoker.  Sonographer:    Ilda Mori MHA, BS, RDCS Referring Phys: [id number] Sondra Barges  Sonographer Comments: Technically challenging study due to limited acoustic windows. Image acquisition challenging due to COPD and Image acquisition challenging due to mastectomy. IMPRESSIONS  1. Left ventricular ejection fraction, by estimation, is 35 to 40%. The left ventricle has moderately decreased function. The left ventricle demonstrates global hypokinesis. The left ventricular internal cavity size was mildly dilated. Left ventricular diastolic parameters are indeterminate.  2. Right ventricular systolic function is normal. The right ventricular size is normal.  3. The mitral valve is  normal in structure. No evidence of mitral valve regurgitation. No evidence of mitral stenosis.  4. The aortic valve is normal in structure. Aortic valve regurgitation is mild. No aortic stenosis is present.  5. The inferior vena cava is normal in size with greater than 50% respiratory variability, suggesting right atrial pressure of 3 mmHg. FINDINGS  Left Ventricle: Left ventricular ejection fraction, by estimation, is 35 to 40%. The left ventricle has moderately decreased function. The left ventricle demonstrates global hypokinesis. Definity contrast agent was given IV to delineate the left ventricular endocardial borders. The left ventricular internal cavity size was mildly dilated. There is no left ventricular hypertrophy. Left ventricular diastolic parameters are  indeterminate. Right Ventricle: The right ventricular size is normal. No increase in right ventricular wall thickness. Right ventricular systolic function is normal. Left Atrium: Left atrial size was normal in size. Right Atrium: Right atrial size was normal in size. Pericardium: There is no evidence of pericardial effusion. Mitral Valve: The mitral valve is normal in structure. No evidence of mitral valve stenosis. Tricuspid Valve: The tricuspid valve is normal in structure. Tricuspid valve regurgitation is not demonstrated. No evidence of tricuspid stenosis. Aortic Valve: The aortic valve is normal in structure. Aortic valve regurgitation is mild. No aortic stenosis is present. Pulmonic Valve: The pulmonic valve was normal in structure. Pulmonic valve regurgitation is not visualized. No evidence of pulmonic stenosis. Aorta: The aortic root is normal in size and structure. Venous: The inferior vena cava is normal in size with greater than 50% respiratory variability, suggesting right atrial pressure of 3 mmHg. IAS/Shunts: No atrial level shunt detected by color flow Doppler. LEFT VENTRICLE PLAX 2D LVIDd:         4.70 cm LV PW:         0.80 cm LV IVS:         0.60 cm  Julien Nordmann MD Electronically signed by Julien Nordmann MD Signature Date/Time: 02/27/2024/6:58:33 PM    Final    MR Lumbar Spine W Wo Contrast Result Date: 01/03/2024 CLINICAL DATA:  lower back pain. History of metastatic breast cancer. EXAM: MRI LUMBAR SPINE WITHOUT AND WITH CONTRAST TECHNIQUE: Multiplanar and multiecho pulse sequences of the lumbar spine were obtained without and with intravenous contrast. CONTRAST:  8mL GADAVIST GADOBUTROL 1 MMOL/ML IV SOLN COMPARISON:  CT lumbar spine 01/02/2024. MRI lumbar spine February 18, 2022. FINDINGS: Segmentation: Standard. Alignment:  Physiologic. Vertebrae: No evidence of fracture, evidence of discitis, or suspicious bone lesion. There is no pathologic enhancement. Conus medullaris and cauda equina: Conus extends to the L1 level. Conus and cauda equina appear normal. Paraspinal and other soft tissues: Negative. Disc levels: Small right paracentral disc protrusion at L5-S1. No significant canal or foraminal stenosis. IMPRESSION: 1. No evidence of bony metastatic disease in the lumbar spine. 2. No significant canal or foraminal stenosis. Electronically Signed   By: Feliberto Harts M.D.   On: 01/03/2024 15:55   CT Lumbar Spine Wo Contrast Result Date: 01/02/2024 CLINICAL DATA:  Low back pain.  History of metastatic breast cancer. EXAM: CT LUMBAR SPINE WITHOUT CONTRAST TECHNIQUE: Multidetector CT imaging of the lumbar spine was performed without intravenous contrast administration. Multiplanar CT image reconstructions were also generated. RADIATION DOSE REDUCTION: This exam was performed according to the departmental dose-optimization program which includes automated exposure control, adjustment of the mA and/or kV according to patient size and/or use of iterative reconstruction technique. COMPARISON:  Nuclear medicine PET CT 10/11/2023 FINDINGS: Segmentation: 5 lumbar type vertebrae. Alignment: Normal. Vertebrae: No acute fracture or focal pathologic  process. Paraspinal and other soft tissues: Negative. Scattered calcifications in the distal abdominal aorta and iliac vessels. Disc levels: Mild disc bulge at L5-S1.  Disc spaces maintained. IMPRESSION: No acute or focal bony abnormality. Mild broad-based disc bulge at L5-S1. Electronically Signed   By: Charlett Nose M.D.   On: 01/02/2024 19:15

## 2024-03-22 NOTE — Assessment & Plan Note (Signed)
 No bone lesions on Nov 2024 PET.  01/03/24 MRI lumbar w wo contrast showed no bony metastatic disease.

## 2024-03-22 NOTE — Assessment & Plan Note (Signed)
Continue calcium supplementation 600mg  TID.

## 2024-03-22 NOTE — Assessment & Plan Note (Addendum)
 Recurrent breast cancer with isolated bone metastasis, stage IV #Initially stage IIIA right breast cancer, ER positive, HER-2 negative status post bilateral oophorectomy,Right mastectomy and right axillary lymph node dissection, status post implant, and implant removal.  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 ER positive, HER-2 positive  .- s/p Palliative radiation to T7   10/20/23 PET NED,10/17/23 normal bone density on DEXA Labs reviewed and discussed Hold Kanjinti Perjecta due to reduction of LVEF Obtain PET in June  Continue fulvestrant monthly.

## 2024-03-23 ENCOUNTER — Other Ambulatory Visit: Payer: Self-pay

## 2024-03-25 ENCOUNTER — Other Ambulatory Visit: Payer: Self-pay

## 2024-04-04 ENCOUNTER — Ambulatory Visit (INDEPENDENT_AMBULATORY_CARE_PROVIDER_SITE_OTHER): Admitting: Neurosurgery

## 2024-04-04 DIAGNOSIS — M5126 Other intervertebral disc displacement, lumbar region: Secondary | ICD-10-CM

## 2024-04-04 DIAGNOSIS — M5136 Other intervertebral disc degeneration, lumbar region with discogenic back pain only: Secondary | ICD-10-CM

## 2024-04-04 NOTE — Progress Notes (Signed)
 Neurosurgery Telephone (Audio-Only) Note  Requesting Provider     Macie Saxon, MD [address] [place],  [place] [address] T: [phone] F: [phone]  Primary Care Provider Macie Saxon, MD [address] [place] [address] T: [phone] F: [phone]  Telehealth visit was conducted with Theresa Chaney, a 43 y.o. female via telephone.  History of Present Illness: Theresa Chaney is a 43 y.o presenting today via telephone visit to discuss her progress since her last visit after initiating home exercises for low back pain. Today she reports some improvement of her low back pain since starting home exercises and also feels that the weather has improved her pain as well. She denies any new concerns.  02/15/24 Theresa Chaney is a 43 y.o with a history of recurrent stage IV breast cancer, nonischemic cardiomyopathy, rheumatoid arthritis, who is here today with a chief complaint of acute on chronic low back pain.  She states that she is having intermittent low back pain particularly off to the right-hand side for several months which acutely worsened resulting in ER visit on 01/02/2024.  Since then her pain has improved some and is largely localized to the middle of her back and off to the left-hand side.  It is worse with standing for longer than a few minutes and walking for prolonged periods of time and improves with being still.  She has attempted conservative treatment with ibuprofen , oxycodone , and Robaxin  which has not provided any significant relief.  Overall the severity of her pain has improved over the course of the last month and a half but she still has some throbbing/toothache type pain in her low back that is interfering with her activity level.  She has not undergone any conservative treatment with physical therapy.   Past Surgery: no spinal surgeries   Theresa Chaney has no symptoms of cervical myelopathy.   The symptoms are causing a significant impact on the  patient's life.     General Review of Systems:  A ROS was performed including pertinent positive and negatives as documented.  All other systems are negative.   Prior to Admission medications   Medication Sig Start Date End Date Taking? Authorizing Provider  acetaminophen  (TYLENOL ) 500 MG tablet Take 500 mg by mouth every 6 (six) hours as needed.    [provider]  albuterol  (VENTOLIN  HFA) 108 (90 Base) MCG/ACT inhaler Inhale 2 puffs into the lungs every 6 (six) hours as needed for wheezing or shortness of breath.     [provider]  CALCIUM  CARBONATE-VITAMIN D  PO Take 600-800 mg by mouth in the morning, at noon, and at bedtime.    [provider]  chlorhexidine  (PERIDEX ) 0.12 % solution Use as directed 15 mLs in the mouth or throat 2 (two) times daily. 05/27/22   Timmy Forbes, MD  cyanocobalamin  (,VITAMIN B-12,) 1000 MCG/ML injection Inject 1,000 mcg into the skin every 30 (thirty) days. 09/02/19   [provider]  diphenoxylate -atropine  (LOMOTIL ) 2.5-0.025 MG tablet Take 1 tablet by mouth 4 (four) times daily as needed for diarrhea or loose stools. 10/28/20   Timmy Forbes, MD  escitalopram  (LEXAPRO ) 20 MG tablet Take 20 mg by mouth daily.    [provider]  esomeprazole (NEXIUM) 40 MG capsule Take 40 mg by mouth daily before breakfast.     [provider]  Eszopiclone 3 MG TABS Take 3 mg by mouth at bedtime as needed (sleep).  08/21/20   [provider]  folic acid  (FOLVITE ) 1 MG tablet Take 1 mg by mouth daily. 09/02/19   [provider]  hydrocortisone 2.5 % cream     [provider]  hydroxychloroquine (PLAQUENIL) 200 MG tablet Take 200 mg by mouth daily. 06/08/21   [provider]  hydrOXYzine  (ATARAX ) 10 MG tablet Take 1 tablet (10 mg total) by mouth every 6 (six) hours as needed. 01/16/24   Timmy Forbes, MD  ibuprofen  (ADVIL ) 800 MG tablet Take 800 mg by mouth every 8 (eight) hours as needed for moderate pain.     [provider]  loperamide  (IMODIUM ) 2 MG capsule Take 1 tablet (2 mg total) by mouth See admin instructions. Take 2 tablets with onset of diarrhea, then take 1 tablet every 2 hours until diarrhea stops. Maximum 8 tablets in 24 08/19/22   Timmy Forbes, MD  loratadine  (CLARITIN ) 10 MG tablet Take 10 mg by mouth daily.     [provider]  LORazepam (ATIVAN) 1 MG tablet Take 1 mg by mouth 3 (three) times daily.    [provider]  Magnesium 500 MG TABS Take 500 mg by mouth 2 (two) times daily.    [provider]  methocarbamol  (ROBAXIN ) 500 MG tablet Take by mouth. 01/10/23   [provider]  methotrexate (RHEUMATREX) 2.5 MG tablet Take 25 mg by mouth every Sunday. 10 tablets once a week 09/02/19   [provider]  metoprolol  succinate (TOPROL  XL) 25 MG 24 hr tablet Take 1 tablet (25 mg total) by mouth at bedtime. Monitor for lightheadedness and dizziness. 03/12/24   Darlis Eisenmenger, MD  mupirocin  ointment (BACTROBAN ) 2 % Place 1 application into the nose 2 (two) times daily. Use in each nostril twice daily for five (5) days. 05/27/21   Timmy Forbes, MD  nortriptyline (PAMELOR) 10 MG capsule Take 30 mg by mouth at bedtime.     [provider]  oxyCODONE  (OXY IR/ROXICODONE ) 5 MG immediate release tablet Take 1 tablet (5 mg total) by mouth every 6 (six) hours as needed for moderate pain (pain score 4-6) or severe pain (pain score 7-10). 01/10/24   Timmy Forbes, MD  phentermine (ADIPEX-P) 37.5 MG tablet Take 37.5 mg by mouth daily before breakfast.  09/17/19   [provider]  pregabalin  (LYRICA ) 150 MG capsule Take 150 mg by mouth 2 (two) times daily. 07/27/20   [provider]  promethazine  (PHENERGAN ) 25 MG tablet Take 1 tablet (25 mg total) by mouth every 8 (eight) hours as needed for nausea or vomiting. 02/15/22   Timmy Forbes, MD  pyridOXINE (VITAMIN B-6) 100 MG tablet Take 100 mg by mouth daily.    [provider]  topiramate (TOPAMAX)  50 MG tablet Take 50 mg by mouth 2 (two) times daily.  11/06/19   [provider]  valsartan  (DIOVAN ) 40 MG tablet Take 0.5 tablets (20 mg total) by mouth at bedtime. 03/12/24   Darlis Eisenmenger, MD  vitamin C (ASCORBIC ACID) 500 MG tablet Take 500 mg by mouth daily.    [provider]    DATA REVIEWED    Imaging Studies  No interval imaging to review  IMPRESSION  Theresa Chaney is a 43 y.o. female who I performed a telephone encounter today for evaluation and management of low back pain and mild degenerative disc disease   PLAN  Theresa Chaney presents today via telephone visit to discuss her low back pain. She has made improvement in her back pain since starting  home exercises.  We briefly discussed additional treatment options including formal physical therapy however she feels she is managing well at this time.  I will see her going forward on an as-needed basis.  She expressed understanding and was in agreement with this plan.  No orders of the defined types were placed in this encounter.  DISPOSITION  Follow up: In person appointment in [facility]  Noble Bateman, PA Neurosurgery  TELEPHONE DOCUMENTATION   This visit was performed via telephone.  Patient location: home Provider location: office  I spent a total of 5 minutes non-face-to-face activities for this visit on the date of this encounter including review of current clinical condition and response to treatment.  The patient is aware of and accepts the limits of this telehealth visit.

## 2024-04-10 ENCOUNTER — Other Ambulatory Visit: Payer: Self-pay | Admitting: Oncology

## 2024-04-10 ENCOUNTER — Other Ambulatory Visit: Payer: Self-pay | Admitting: Physician Assistant

## 2024-04-11 ENCOUNTER — Other Ambulatory Visit: Payer: Self-pay

## 2024-04-11 MED ORDER — OXYCODONE HCL 5 MG PO TABS: 5.0000 mg | ORAL_TABLET | Freq: Four times a day (QID) | ORAL | 0 refills | Status: DC | PRN

## 2024-04-16 ENCOUNTER — Encounter: Payer: Self-pay | Admitting: Oncology

## 2024-04-19 ENCOUNTER — Inpatient Hospital Stay: Attending: Oncology

## 2024-04-19 ENCOUNTER — Inpatient Hospital Stay

## 2024-04-19 DIAGNOSIS — C7951 Secondary malignant neoplasm of bone: Secondary | ICD-10-CM | POA: Insufficient documentation

## 2024-04-19 DIAGNOSIS — Z170 Estrogen receptor positive status [ER+]: Secondary | ICD-10-CM | POA: Diagnosis not present

## 2024-04-19 DIAGNOSIS — C50811 Malignant neoplasm of overlapping sites of right female breast: Secondary | ICD-10-CM

## 2024-04-19 DIAGNOSIS — Z5111 Encounter for antineoplastic chemotherapy: Secondary | ICD-10-CM | POA: Diagnosis present

## 2024-04-19 DIAGNOSIS — C50411 Malignant neoplasm of upper-outer quadrant of right female breast: Secondary | ICD-10-CM | POA: Diagnosis present

## 2024-04-19 LAB — BASIC METABOLIC PANEL - CANCER CENTER ONLY
Anion gap: 8 (ref 5–15)
BUN: 22 mg/dL — ABNORMAL HIGH (ref 6–20)
CO2: 23 mmol/L (ref 22–32)
Calcium: 8.8 mg/dL — ABNORMAL LOW (ref 8.9–10.3)
Chloride: 107 mmol/L (ref 98–111)
Creatinine: 1.01 mg/dL — ABNORMAL HIGH (ref 0.44–1.00)
GFR, Estimated: >60 mL/min (ref >60)
Glucose, Bld: 124 mg/dL — ABNORMAL HIGH (ref 70–99)
Potassium: 3.4 mmol/L — ABNORMAL LOW (ref 3.5–5.1)
Sodium: 138 mmol/L (ref 135–145)

## 2024-04-19 MED ORDER — ZOLEDRONIC ACID 4 MG/100ML IV SOLN
4.0000 mg | Freq: Once | INTRAVENOUS | Status: AC
  Administered 2024-04-19: 4 mg via INTRAVENOUS
  Filled 2024-04-19: qty 100

## 2024-04-19 MED ORDER — SODIUM CHLORIDE 0.9 % IV SOLN
INTRAVENOUS | Status: DC
  Filled 2024-04-19: qty 250

## 2024-04-19 MED ORDER — FULVESTRANT 250 MG/5ML IM SOSY
500.0000 mg | PREFILLED_SYRINGE | INTRAMUSCULAR | Status: DC
  Administered 2024-04-19: 500 mg via INTRAMUSCULAR
  Filled 2024-04-19: qty 10

## 2024-04-19 MED ORDER — HEPARIN SOD (PORK) LOCK FLUSH 100 UNIT/ML IV SOLN
500.0000 [IU] | Freq: Once | INTRAVENOUS | Status: AC
  Administered 2024-04-19: 500 [IU] via INTRAVENOUS
  Filled 2024-04-19: qty 5

## 2024-04-19 NOTE — Patient Instructions (Signed)
 [facility] - A DEPT OF . [facility]  Discharge Instructions: Thank you for choosing [facility] to provide your oncology and hematology care.  If you have a lab appointment with the Cancer Center, please go directly to the Cancer Center and check in at the registration area.  Wear comfortable clothing and clothing appropriate for easy access to any Portacath or PICC line.   We strive to give you quality time with your provider. You may need to reschedule your appointment if you arrive late (15 or more minutes).  Arriving late affects you and other patients whose appointments are after yours.  Also, if you miss three or more appointments without notifying the office, you may be dismissed from the clinic at the provider's discretion.      For prescription refill requests, have your pharmacy contact our office and allow 72 hours for refills to be completed.    Today you received the following chemotherapy and/or immunotherapy agents Zometa  and Faslodex .      To help prevent nausea and vomiting after your treatment, we encourage you to take your nausea medication as directed.  BELOW ARE SYMPTOMS THAT SHOULD BE REPORTED IMMEDIATELY: *FEVER GREATER THAN 100.4 F (38 C) OR HIGHER *CHILLS OR SWEATING *NAUSEA AND VOMITING THAT IS NOT CONTROLLED WITH YOUR NAUSEA MEDICATION *UNUSUAL SHORTNESS OF BREATH *UNUSUAL BRUISING OR BLEEDING *URINARY PROBLEMS (pain or burning when urinating, or frequent urination) *BOWEL PROBLEMS (unusual diarrhea, constipation, pain near the anus) TENDERNESS IN MOUTH AND THROAT WITH OR WITHOUT PRESENCE OF ULCERS (sore throat, sores in mouth, or a toothache) UNUSUAL RASH, SWELLING OR PAIN  UNUSUAL VAGINAL DISCHARGE OR ITCHING   Items with * indicate a potential emergency and should be followed up as soon as possible or go to the Emergency Department if any problems should occur.  Please show the CHEMOTHERAPY ALERT CARD or  IMMUNOTHERAPY ALERT CARD at check-in to the Emergency Department and triage nurse.  Should you have questions after your visit or need to cancel or reschedule your appointment, please contact [facility] - A DEPT OF [facility]  [phone] and follow the prompts.  Office hours are 8:00 a.m. to 4:30 p.m. Monday - Friday. Please note that voicemails left after 4:00 p.m. may not be returned until the following business day.  We are closed weekends and major holidays. You have access to a nurse at all times for urgent questions. Please call the main number to the clinic [phone] and follow the prompts.  For any non-urgent questions, you may also contact your provider using MyChart. We now offer e-Visits for anyone 22 and older to request care online for non-urgent symptoms. For details visit [email or web address].   Also download the MyChart app! Go to the app store, search "MyChart", open the app, select [facility], and log in with your MyChart username and password.

## 2024-05-02 ENCOUNTER — Encounter: Payer: Self-pay | Admitting: Cardiology

## 2024-05-03 ENCOUNTER — Other Ambulatory Visit
Admission: RE | Admit: 2024-05-03 | Discharge: 2024-05-03 | Disposition: A | Discharge status: Home / Self Care | Source: Ambulatory Visit | Attending: Cardiology | Admitting: Cardiology

## 2024-05-03 ENCOUNTER — Other Ambulatory Visit: Payer: Self-pay

## 2024-05-03 ENCOUNTER — Telehealth: Payer: Self-pay | Admitting: Family

## 2024-05-03 ENCOUNTER — Ambulatory Visit (HOSPITAL_COMMUNITY): Payer: Self-pay | Admitting: Cardiology

## 2024-05-03 DIAGNOSIS — I428 Other cardiomyopathies: Secondary | ICD-10-CM | POA: Diagnosis present

## 2024-05-03 LAB — BASIC METABOLIC PANEL WITH GFR
Anion gap: 10 (ref 5–15)
BUN: 20 mg/dL (ref 6–20)
CO2: 23 mmol/L (ref 22–32)
Calcium: 8.8 mg/dL — ABNORMAL LOW (ref 8.9–10.3)
Chloride: 106 mmol/L (ref 98–111)
Creatinine, Ser: 0.85 mg/dL (ref 0.44–1.00)
GFR, Estimated: >60 mL/min (ref >60)
Glucose, Bld: 118 mg/dL — ABNORMAL HIGH (ref 70–99)
Potassium: 3.6 mmol/L (ref 3.5–5.1)
Sodium: 139 mmol/L (ref 135–145)

## 2024-05-03 LAB — BRAIN NATRIURETIC PEPTIDE: B Natriuretic Peptide: 116.1 pg/mL — ABNORMAL HIGH (ref 0.0–100.0)

## 2024-05-03 NOTE — Telephone Encounter (Signed)
 Called to confirm/remind patient of their appointment at the [facility] on 05/07/24.   Appointment:   [x] Confirmed  [] Left mess   [] No answer/No voice mail  [] VM Full/unable to leave message  [] Phone not in service  Patient reminded to bring all medications and/or complete list.  Confirmed patient has transportation. Gave directions, instructed to utilize valet parking.

## 2024-05-07 ENCOUNTER — Ambulatory Visit: Attending: Cardiology | Admitting: Cardiology

## 2024-05-07 ENCOUNTER — Encounter: Payer: Self-pay | Admitting: Cardiology

## 2024-05-07 VITALS — BP 103/59 | HR 96 | Wt 183.6 lb

## 2024-05-07 DIAGNOSIS — Z9013 Acquired absence of bilateral breasts and nipples: Secondary | ICD-10-CM | POA: Diagnosis not present

## 2024-05-07 DIAGNOSIS — T451X5S Adverse effect of antineoplastic and immunosuppressive drugs, sequela: Secondary | ICD-10-CM | POA: Insufficient documentation

## 2024-05-07 DIAGNOSIS — I427 Cardiomyopathy due to drug and external agent: Secondary | ICD-10-CM | POA: Insufficient documentation

## 2024-05-07 DIAGNOSIS — I5022 Chronic systolic (congestive) heart failure: Secondary | ICD-10-CM | POA: Diagnosis not present

## 2024-05-07 DIAGNOSIS — I471 Supraventricular tachycardia, unspecified: Secondary | ICD-10-CM | POA: Insufficient documentation

## 2024-05-07 DIAGNOSIS — C7951 Secondary malignant neoplasm of bone: Secondary | ICD-10-CM | POA: Diagnosis not present

## 2024-05-07 DIAGNOSIS — Z79631 Long term (current) use of antimetabolite agent: Secondary | ICD-10-CM | POA: Diagnosis not present

## 2024-05-07 DIAGNOSIS — F17210 Nicotine dependence, cigarettes, uncomplicated: Secondary | ICD-10-CM | POA: Insufficient documentation

## 2024-05-07 DIAGNOSIS — Z923 Personal history of irradiation: Secondary | ICD-10-CM | POA: Diagnosis not present

## 2024-05-07 DIAGNOSIS — Z853 Personal history of malignant neoplasm of breast: Secondary | ICD-10-CM | POA: Diagnosis not present

## 2024-05-07 DIAGNOSIS — I428 Other cardiomyopathies: Secondary | ICD-10-CM | POA: Diagnosis present

## 2024-05-07 DIAGNOSIS — Z79899 Other long term (current) drug therapy: Secondary | ICD-10-CM | POA: Insufficient documentation

## 2024-05-07 DIAGNOSIS — M069 Rheumatoid arthritis, unspecified: Secondary | ICD-10-CM | POA: Insufficient documentation

## 2024-05-07 MED ORDER — VALSARTAN 40 MG PO TABS: 20.0000 mg | ORAL_TABLET | Freq: Two times a day (BID) | ORAL | 3 refills | Status: AC

## 2024-05-07 NOTE — Patient Instructions (Signed)
 Medication Changes:  INCREASE Valsartan  to 20 MG (a half tablet) TWO times daily.  A new prescription has been sent to [facility].   Lab Work:  Please return to the [facility] for LAB WORK in 2 weeks. We will call you with any abnormal results, and results will be available in your MyChart.   Special Instructions // Education:  Do the following things EVERYDAY: Weigh yourself in the morning before breakfast. Write it down and keep it in a log. Take your medicines as prescribed Eat low salt foods--Limit salt (sodium) to 2000 mg per day.  Stay as active as you can everyday Limit all fluids for the day to less than 2 liters   Follow-Up in: 5-6 weeks with Dr. Mclean. Please call our office in 2 weeks to schedule this appointment.     If you have any questions or concerns before your next appointment please send us  a message through [place] or call our office at [phone], If it is after office hours your call will be answered by our answering service and directed appropriately.     At the [facility], you and your health needs are our priority. We have a designated team specialized in the treatment of Heart Failure. This Care Team includes your primary Heart Failure Specialized Cardiologist (physician), Advanced Practice Providers (APPs- Physician Assistants and Nurse Practitioners), and Pharmacist who all work together to provide you with the care you need, when you need it.   You may see any of the following providers on your designated Care Team at your next follow up:  Dr. Jules Oar Dr. Peder Bourdon Dr. Alwin Baars Dr. Judyth Nunnery Nieves Bars, NP [place], [place] [address] [place], [place] Dennise Fitz, NP [place] Lee, NP [place] Dellen, NP Bevely Brush, PharmD

## 2024-05-07 NOTE — Progress Notes (Signed)
 PCP: Theresa Saxon, MD Oncology: Dr. Wilhelmenia Harada Cardiology: Dr. Nolan Battle HF Cardiology: Dr. Mitzie Anda  Chief complaint: CHF  43 y.o. 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 Patient has breast cancer initially diagnosed in 11/28, treated with doxorubicin /cyclophosphamide  x 4 cycles then Taxol  x 12 cycles.  Right mastectomy + radiation.  Echo in 9/19 showed EF down to 45%, but by 9/20, EF was up to 55-60%.  Prophylactic left mastectomy in 2020. She was found to have T-spine mets in 7/21, biopsy showed ER+/HER2+.  She was started on trastuzumab .  In 10/22, echo showed EF down to 45%.  Trastuzumab  was held and Toprol  XL was started.  EF was up to 55-60% by 3/23.  Patient is currently on Kanjinti  (trastuzumab  anns) and Perjeta .  Most recent echo in 3/25 showed EF down to 35-40%, global hypokinesis, normal RV. Kanjinti  has been stopped and she was restarted on Toprol  XL (had been off this medication) and then valsartan .  Patient has history of SVT.    Patient returns for followup of CHF.  She remains off Kanjinti .  Weight is stable.  Still smoking but working on quitting.  She remains short of breath walking fast up stairs or with heavy yardwork. She has no problems with her ADLs. No chest pain.  No palpitations or lightheadedness.  No orthopnea/PND.    Labs (2/25): LDL 138 Labs (3/25): K 3.5, creatinine 0.67 Labs (5/25): K 3.6, creatinine 0.85, BNP 116  PMH: 1. Depression 2. GERD 3. Rheumatoid arthritis: on methotrexate, hydroxychloroquine.  4. SVT 5. Migraines 6. Breast cancer: Diagnosed 11/18, treated with doxorubicin /cyclophosphamide  x 4 cycles then Taxol  x 12 cycles.  Right mastectomy + radiation.  Prophylactic left mastectomy in 2020. Bilateral oophorectomy.  - T-spine mets in 7/21. Biopsy showed ER+/HER2+ - Now getting Kanjinti  + Perjeta .  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  - Echo (9/19): EF 45% - Echo (9/20): EF  up to 55-60%  - Echo (10/22): EF back down to 45%, trastuzumab  held.  - Echo (12/22): EF 45-50% - Echo (3/23): EF 55-60%, back on trastuzumab  - Echo (3/24): EF 50-55% - Echo (6/24): EF 50-55% - Echo (9/24): EF 45-50% - Echo (12/24): EF 50-55% - Echo (3/25): EF 35-40%, global hypokinesis, normal RV => trastuzumab  anns held.   FH: Father with MI in his 24s, brother with pacemaker in his 38s.   SH: Married, lives in [place], smoking 3-4 cigarettes/day  ROS: All systems reviewed and negative except as per HPI.   Current Outpatient Medications  Medication Sig Dispense Refill   acetaminophen  (TYLENOL ) 500 MG tablet Take 500 mg by mouth every 6 (six) hours as needed.     albuterol  (VENTOLIN  HFA) 108 (90 Base) MCG/ACT inhaler Inhale 2 puffs into the lungs every 6 (six) hours as needed for wheezing or shortness of breath.      CALCIUM  CARBONATE-VITAMIN D  PO Take 600-800 mg by mouth in the morning, at noon, and at bedtime.     chlorhexidine  (PERIDEX ) 0.12 % solution Use as directed 15 mLs in the mouth or throat 2 (two) times daily. 120 mL 0   cyanocobalamin  (,VITAMIN B-12,) 1000 MCG/ML injection Inject 1,000 mcg into the skin every 30 (thirty) days.     diphenoxylate -atropine  (LOMOTIL ) 2.5-0.025 MG tablet Take 1 tablet by mouth 4 (four) times daily as needed for diarrhea or loose stools. 60 tablet 0   escitalopram  (LEXAPRO ) 20 MG tablet Take 20 mg by mouth daily.     esomeprazole (NEXIUM) 40 MG  capsule Take 40 mg by mouth daily before breakfast.      Eszopiclone 3 MG TABS Take 3 mg by mouth at bedtime as needed (sleep).      folic acid  (FOLVITE ) 1 MG tablet Take 1 mg by mouth daily.     hydrocortisone 2.5 % cream      hydroxychloroquine (PLAQUENIL) 200 MG tablet Take 200 mg by mouth daily.     hydrOXYzine  (ATARAX ) 10 MG tablet Take 1 tablet (10 mg total) by mouth every 6 (six) hours as needed. 60 tablet 0   ibuprofen  (ADVIL ) 800 MG tablet Take 800 mg by mouth every 8 (eight) hours as needed  for moderate pain.     loperamide  (IMODIUM ) 2 MG capsule Take 1 tablet (2 mg total) by mouth See admin instructions. Take 2 tablets with onset of diarrhea, then take 1 tablet every 2 hours until diarrhea stops. Maximum 8 tablets in 24 90 capsule 0   loratadine  (CLARITIN ) 10 MG tablet Take 10 mg by mouth daily.      LORazepam (ATIVAN) 1 MG tablet Take 1 mg by mouth 3 (three) times daily.     Magnesium 500 MG TABS Take 500 mg by mouth 2 (two) times daily.     methocarbamol  (ROBAXIN ) 500 MG tablet Take by mouth.     methotrexate (RHEUMATREX) 2.5 MG tablet Take 25 mg by mouth every Sunday. 10 tablets once a week     metoprolol  succinate (TOPROL  XL) 25 MG 24 hr tablet Take 1 tablet (25 mg total) by mouth at bedtime. Monitor for lightheadedness and dizziness. 90 tablet 3   mupirocin  ointment (BACTROBAN ) 2 % Place 1 application into the nose 2 (two) times daily. Use in each nostril twice daily for five (5) days. 22 g 5   nortriptyline (PAMELOR) 10 MG capsule Take 30 mg by mouth at bedtime.      oxyCODONE  (OXY IR/ROXICODONE ) 5 MG immediate release tablet Take 1 tablet (5 mg total) by mouth every 6 (six) hours as needed for moderate pain (pain score 4-6) or severe pain (pain score 7-10). 90 tablet 0   phentermine (ADIPEX-P) 37.5 MG tablet Take 37.5 mg by mouth daily before breakfast.      pregabalin  (LYRICA ) 150 MG capsule Take 150 mg by mouth 2 (two) times daily.     promethazine  (PHENERGAN ) 25 MG tablet Take 1 tablet (25 mg total) by mouth every 8 (eight) hours as needed for nausea or vomiting. 90 tablet 0   pyridOXINE (VITAMIN B-6) 100 MG tablet Take 100 mg by mouth daily.     topiramate (TOPAMAX) 50 MG tablet Take 50 mg by mouth 2 (two) times daily.      valsartan  (DIOVAN ) 40 MG tablet Take 0.5 tablets (20 mg total) by mouth 2 (two) times daily. 90 tablet 3   vitamin C (ASCORBIC ACID) 500 MG tablet Take 500 mg by mouth daily.     No current facility-administered medications for this visit.    Facility-Administered Medications Ordered in Other Visits  Medication Dose Route Frequency Provider Last Rate Last Admin   heparin  lock flush 100 unit/mL  500 Units Intravenous Once Timmy Forbes, MD       sodium chloride  flush (NS) 0.9 % injection 10 mL  10 mL Intravenous PRN Timmy Forbes, MD   10 mL at 11/19/18 1249   sodium chloride  flush (NS) 0.9 % injection 10 mL  10 mL Intravenous Once Timmy Forbes, MD       BP (!) 103/59  Pulse 96   Wt 183 lb 9.6 oz (83.3 kg)   LMP  (LMP Unknown) Comment: Ovaries and tubes removed.April 2019  SpO2 100%   BMI 28.76 kg/m  General: NAD Neck: No JVD, no thyromegaly or thyroid  nodule.  Lungs: Clear to auscultation bilaterally with normal respiratory effort. CV: Nondisplaced PMI.  Heart regular S1/S2, no S3/S4, no murmur.  No peripheral edema.  No carotid bruit.  Normal pedal pulses.  Abdomen: Soft, nontender, no hepatosplenomegaly, no distention.  Skin: Intact without lesions or rashes.  Neurologic: Alert and oriented x 3.  Psych: Normal affect. Extremities: No clubbing or cyanosis.  HEENT: Normal.   Assessment/Plan: 1. Cardiomyopathy: Echo in 3/25 with EF 35-40%, global hypokinesis, normal RV. Possible trastuzumab -related cardiomyopathy.  Patient had  a prior drop in EF while on Herceptin -based chemotherapy in 10/22, trastuzumab  was held and Toprol  XL started with increase in EF back to normal range. She stopped the Toprol  XL.  Patient is now off Kanjinti  (trastuzumab  anns) and has restarted on Toprol  XL and valsartan .  Chronic NYHA class II symptoms and not volume overloaded on exam.  - Stay off trastuzumab -based chemotherapy for now.  - Continue Toprol  XL 25 mg daily.  - Increase valsartan  to 20 mg bid.  BMET/BNP today, BMET in 10 days.  - I will arrange for cardiac MRI to assess for infiltrative disease, etc (is there some other cause for her cardiomyopathy other than trastuzumab ?) => scheduled in 6/25.   - She has an echo scheduled next week.  If EF  recovers to normal, consider restarting trastuzumab -related chemotherapy but will need to stay on HF medications. If EF does not recover, may need to hold off on trastuzumab .  2. Smoking: I strongly encouraged her to completely quit.  3. Rheumatoid arthritis: On methotrexate and hydroxychloroquine. Hydroxychloroquine can cause a cardiomyopathy.   Followup with me in 5-6 weeks.    I spent 32 minutes reviewing records, interviewing/examining patient, and managing orders.   Peder Bourdon 05/07/2024

## 2024-05-14 ENCOUNTER — Ambulatory Visit
Admission: RE | Admit: 2024-05-14 | Discharge: 2024-05-14 | Disposition: A | Discharge status: Home / Self Care | Source: Ambulatory Visit | Attending: Cardiology | Admitting: Cardiology

## 2024-05-14 DIAGNOSIS — I428 Other cardiomyopathies: Secondary | ICD-10-CM | POA: Diagnosis present

## 2024-05-14 DIAGNOSIS — I081 Rheumatic disorders of both mitral and tricuspid valves: Secondary | ICD-10-CM | POA: Diagnosis not present

## 2024-05-14 DIAGNOSIS — J449 Chronic obstructive pulmonary disease, unspecified: Secondary | ICD-10-CM | POA: Diagnosis not present

## 2024-05-14 LAB — ECHOCARDIOGRAM COMPLETE
AR max vel: 2.28 cm2
AV Area VTI: 2.56 cm2
AV Area mean vel: 2.34 cm2
AV Mean grad: 3 mmHg
AV Peak grad: 5.5 mmHg
Ao pk vel: 1.17 m/s
Area-P 1/2: 3.93 cm2
MV VTI: 3.09 cm2
S' Lateral: 3.4 cm

## 2024-05-14 NOTE — Progress Notes (Signed)
*  PRELIMINARY RESULTS* Echocardiogram 2D Echocardiogram has been performed.  Theresa Chaney 05/14/2024, 9:16 AM

## 2024-05-15 ENCOUNTER — Ambulatory Visit (HOSPITAL_COMMUNITY): Payer: Self-pay | Admitting: Cardiology

## 2024-05-20 ENCOUNTER — Encounter (HOSPITAL_COMMUNITY): Payer: Self-pay

## 2024-05-20 ENCOUNTER — Other Ambulatory Visit: Payer: Self-pay | Admitting: Oncology

## 2024-05-20 DIAGNOSIS — Z170 Estrogen receptor positive status [ER+]: Secondary | ICD-10-CM

## 2024-05-22 ENCOUNTER — Other Ambulatory Visit: Payer: Self-pay | Admitting: Cardiology

## 2024-05-22 ENCOUNTER — Other Ambulatory Visit: Payer: Self-pay

## 2024-05-22 ENCOUNTER — Inpatient Hospital Stay: Attending: Oncology

## 2024-05-22 ENCOUNTER — Encounter: Payer: Self-pay | Admitting: Cardiology

## 2024-05-22 ENCOUNTER — Ambulatory Visit
Admission: RE | Admit: 2024-05-22 | Discharge: 2024-05-22 | Disposition: A | Discharge status: Home / Self Care | Source: Ambulatory Visit | Attending: Cardiology | Admitting: Cardiology

## 2024-05-22 DIAGNOSIS — C50411 Malignant neoplasm of upper-outer quadrant of right female breast: Secondary | ICD-10-CM | POA: Diagnosis present

## 2024-05-22 DIAGNOSIS — I428 Other cardiomyopathies: Secondary | ICD-10-CM | POA: Diagnosis present

## 2024-05-22 DIAGNOSIS — C7951 Secondary malignant neoplasm of bone: Secondary | ICD-10-CM | POA: Diagnosis present

## 2024-05-22 DIAGNOSIS — Z79899 Other long term (current) drug therapy: Secondary | ICD-10-CM | POA: Insufficient documentation

## 2024-05-22 DIAGNOSIS — Z5111 Encounter for antineoplastic chemotherapy: Secondary | ICD-10-CM | POA: Insufficient documentation

## 2024-05-22 MED ORDER — FULVESTRANT 250 MG/5ML IM SOSY
500.0000 mg | PREFILLED_SYRINGE | INTRAMUSCULAR | Status: DC
  Administered 2024-05-22: 500 mg via INTRAMUSCULAR
  Filled 2024-05-22: qty 10

## 2024-05-22 MED ORDER — GADOBUTROL 1 MMOL/ML IV SOLN
11.0000 mL | Freq: Once | INTRAVENOUS | Status: AC | PRN
  Administered 2024-05-22: 11 mL via INTRAVENOUS

## 2024-05-23 ENCOUNTER — Inpatient Hospital Stay

## 2024-05-23 ENCOUNTER — Ambulatory Visit (HOSPITAL_COMMUNITY): Payer: Self-pay | Admitting: Cardiology

## 2024-05-31 ENCOUNTER — Other Ambulatory Visit

## 2024-05-31 ENCOUNTER — Telehealth: Payer: Self-pay

## 2024-05-31 ENCOUNTER — Ambulatory Visit: Admitting: Physician Assistant

## 2024-05-31 DIAGNOSIS — I428 Other cardiomyopathies: Secondary | ICD-10-CM

## 2024-05-31 NOTE — Telephone Encounter (Signed)
 Reviewed the results of the cardiac mri. Also spoke to pt about lab work that was to be drawn after the start of valsartan  according to Dr. Mitzie Anda. Orders placed. Follow up appointment made. No further questions at this time.

## 2024-05-31 NOTE — Telephone Encounter (Signed)
-----   Message from Peder Bourdon sent at 05/23/2024  4:00 PM EDT ----- Mildly decreased EF, 48%.  Mild nonischemic cardiomyopathy.  ----- Message ----- From: Interface, Rad Results In Sent: 05/22/2024   2:00 PM EDT To: Darlis Eisenmenger, MD

## 2024-05-31 NOTE — Telephone Encounter (Signed)
 Left voicemail to discuss mri.

## 2024-06-02 ENCOUNTER — Other Ambulatory Visit: Payer: Self-pay

## 2024-06-04 ENCOUNTER — Ambulatory Visit
Admission: RE | Admit: 2024-06-04 | Discharge: 2024-06-04 | Disposition: A | Discharge status: Home / Self Care | Source: Ambulatory Visit | Attending: Oncology | Admitting: Oncology

## 2024-06-04 ENCOUNTER — Ambulatory Visit (HOSPITAL_COMMUNITY): Payer: Self-pay | Admitting: Cardiology

## 2024-06-04 ENCOUNTER — Other Ambulatory Visit
Admission: RE | Admit: 2024-06-04 | Discharge: 2024-06-04 | Disposition: A | Discharge status: Home / Self Care | Source: Ambulatory Visit | Attending: Oncology | Admitting: Oncology

## 2024-06-04 DIAGNOSIS — C50411 Malignant neoplasm of upper-outer quadrant of right female breast: Secondary | ICD-10-CM | POA: Diagnosis not present

## 2024-06-04 DIAGNOSIS — I428 Other cardiomyopathies: Secondary | ICD-10-CM | POA: Insufficient documentation

## 2024-06-04 DIAGNOSIS — Z170 Estrogen receptor positive status [ER+]: Secondary | ICD-10-CM | POA: Insufficient documentation

## 2024-06-04 LAB — BASIC METABOLIC PANEL WITH GFR
Anion gap: 7 (ref 5–15)
BUN: 33 mg/dL — ABNORMAL HIGH (ref 6–20)
CO2: 25 mmol/L (ref 22–32)
Calcium: 9.3 mg/dL (ref 8.9–10.3)
Chloride: 108 mmol/L (ref 98–111)
Creatinine, Ser: 1.11 mg/dL — ABNORMAL HIGH (ref 0.44–1.00)
GFR, Estimated: >60 mL/min (ref >60)
Glucose, Bld: 109 mg/dL — ABNORMAL HIGH (ref 70–99)
Potassium: 3.8 mmol/L (ref 3.5–5.1)
Sodium: 140 mmol/L (ref 135–145)

## 2024-06-04 LAB — GLUCOSE, CAPILLARY: Glucose-Capillary: 105 mg/dL — ABNORMAL HIGH (ref 70–99)

## 2024-06-04 MED ORDER — FLUDEOXYGLUCOSE F - 18 (FDG) INJECTION
9.5000 | Freq: Once | INTRAVENOUS | Status: AC | PRN
Start: 2024-06-04 — End: 2024-06-04
  Administered 2024-06-04: 10.14 via INTRAVENOUS

## 2024-06-09 ENCOUNTER — Encounter: Payer: Self-pay | Admitting: Oncology

## 2024-06-10 ENCOUNTER — Encounter: Payer: Self-pay | Admitting: Oncology

## 2024-06-10 ENCOUNTER — Other Ambulatory Visit: Payer: Self-pay

## 2024-06-17 ENCOUNTER — Telehealth: Payer: Self-pay | Admitting: Cardiology

## 2024-06-17 NOTE — Telephone Encounter (Signed)
 Called to confirm/remind patient of their appointment at [facility] on 06/18/24.   Appointment:   [x] Confirmed  [] Left mess   [] No answer/No voice mail  [] VM Full/unable to leave message  [] Phone not in service  Patient reminded to bring all medications and/or complete list.  Confirmed patient has transportation. Gave directions, instructed to utilize valet parking.

## 2024-06-18 ENCOUNTER — Ambulatory Visit

## 2024-06-18 ENCOUNTER — Other Ambulatory Visit

## 2024-06-18 ENCOUNTER — Ambulatory Visit: Admitting: Oncology

## 2024-06-18 ENCOUNTER — Ambulatory Visit: Attending: Cardiology | Admitting: Cardiology

## 2024-06-18 VITALS — BP 107/70 | HR 98 | Wt 187.0 lb

## 2024-06-18 DIAGNOSIS — M069 Rheumatoid arthritis, unspecified: Secondary | ICD-10-CM | POA: Insufficient documentation

## 2024-06-18 DIAGNOSIS — Z9013 Acquired absence of bilateral breasts and nipples: Secondary | ICD-10-CM | POA: Insufficient documentation

## 2024-06-18 DIAGNOSIS — R002 Palpitations: Secondary | ICD-10-CM | POA: Diagnosis not present

## 2024-06-18 DIAGNOSIS — Z853 Personal history of malignant neoplasm of breast: Secondary | ICD-10-CM | POA: Diagnosis not present

## 2024-06-18 DIAGNOSIS — I428 Other cardiomyopathies: Secondary | ICD-10-CM | POA: Insufficient documentation

## 2024-06-18 DIAGNOSIS — Z79899 Other long term (current) drug therapy: Secondary | ICD-10-CM | POA: Diagnosis not present

## 2024-06-18 DIAGNOSIS — F17200 Nicotine dependence, unspecified, uncomplicated: Secondary | ICD-10-CM | POA: Insufficient documentation

## 2024-06-18 DIAGNOSIS — I427 Cardiomyopathy due to drug and external agent: Secondary | ICD-10-CM | POA: Diagnosis not present

## 2024-06-18 DIAGNOSIS — T451X5A Adverse effect of antineoplastic and immunosuppressive drugs, initial encounter: Secondary | ICD-10-CM

## 2024-06-18 MED ORDER — METOPROLOL SUCCINATE ER 25 MG PO TB24
25.0000 mg | ORAL_TABLET | Freq: Two times a day (BID) | ORAL | 1 refills | Status: AC
Start: 2024-06-18 — End: ?

## 2024-06-18 NOTE — Patient Instructions (Signed)
 Medication Changes:  INCREASE Metoprolol  25mg  (1 tab) two times daily   Testing/Procedures:  Your physician has requested that you have an echocardiogram. Echocardiography is a painless test that uses sound waves to create images of your heart. It provides your doctor with information about the size and shape of your heart and how well your heart's chambers and valves are working. This procedure takes approximately one hour. There are no restrictions for this procedure. Please do NOT wear cologne, perfume, aftershave, or lotions (deodorant is allowed). Please arrive 15 minutes prior to your appointment time.  Please note: We ask at that you not bring children with you during ultrasound (echo/ vascular) testing. Due to room size and safety concerns, children are not allowed in the ultrasound rooms during exams. Our front office staff cannot provide observation of children in our lobby area while testing is being conducted. An adult accompanying a patient to their appointment will only be allowed in the ultrasound room at the discretion of the ultrasound technician under special circumstances. We apologize for any inconvenience.   Follow-Up in: Please follow up with the [facility] in 3 months with Dr. Rolan with an echocardiogram.   At the [facility], you and your health needs are our priority. We have a designated team specialized in the treatment of Heart Failure. This Care Team includes your primary Heart Failure Specialized Cardiologist (physician), Advanced Practice Providers (APPs- Physician Assistants and Nurse Practitioners), and Pharmacist who all work together to provide you with the care you need, when you need it.   You may see any of the following providers on your designated Care Team at your next follow up:  Dr. Toribio Fuel Dr. Ezra Rolan Dr. Ria Commander Dr. Odis Brownie Ellouise Class, FNP Jaun Bash, RPH-CPP  Please be sure to  bring in all your medications bottles to every appointment.   Need to Contact Us :  If you have any questions or concerns before your next appointment please send us  a message through [facility] or call our office at [phone].    TO LEAVE A MESSAGE FOR THE NURSE SELECT OPTION 2, PLEASE LEAVE A MESSAGE INCLUDING: YOUR NAME DATE OF BIRTH CALL BACK NUMBER REASON FOR CALL**this is important as we prioritize the call backs  YOU WILL RECEIVE A CALL BACK THE SAME DAY AS LONG AS YOU CALL BEFORE 4:00 PM

## 2024-06-18 NOTE — Progress Notes (Unsigned)
   [facility] FOLLOW UP CLINIC NOTE  Referring Physician: Buren Rock HERO, MD  Primary Care: Buren Rock HERO, MD Primary Cardiologist:  HPI: Theresa Chaney is a 43 y.o. female who presents for follow up of chemotherapy related cardiomyopathy.      Patient has breast cancer initially diagnosed in 11/28, treated with doxorubicin /cyclophosphamide  x 4 cycles then Taxol  x 12 cycles.  Right mastectomy + radiation.  Echo in 9/19 showed EF down to 45%, but by 9/20, EF was up to 55-60%.  Prophylactic left mastectomy in 2020. She was found to have T-spine mets in 7/21, biopsy showed ER+/HER2+.  She was started on trastuzumab .  In 10/22, echo showed EF down to 45%.  Trastuzumab  was held and Toprol  XL was started.  EF was up to 55-60% by 3/23.  Patient is currently on Kanjinti  (trastuzumab  anns) and Perjeta .  Most recent echo in 3/25 showed EF down to 35-40%, global hypokinesis, normal RV. Kanjinti  has been stopped and she was restarted on Toprol  XL (had been off this medication) and then valsartan .  Patient has history of SVT.       SUBJECTIVE:  Reviewed her recent MRI results showing a mild, NICM with an EF of 48%. She has been taking her medications without issues, though does note that she has been having occasional palpitations throughout the day. No associated dizziness, description not consistent with PVCs, do not generally occur with exertion.   PMH, current medications, allergies, social history, and family history reviewed in epic.  PHYSICAL EXAM: Vitals:   06/18/24 0937  BP: 107/70  Pulse: 98  SpO2: 100%   GENERAL: Well nourished and in no apparent distress at rest.  PULM:  Normal work of breathing, clear to auscultation bilaterally. Respirations are unlabored.  CARDIAC:  JVP: flat         Normal rate with regular rhythm. No murmurs, rubs or gallops.  Trace edema. Warm and well perfused extremities. ABDOMEN: Soft, non-tender, non-distended. NEUROLOGIC: Patient is oriented  x3 with no focal or lateralizing neurologic deficits.   ASSESSMENT & PLAN:  Chemotherapy induced cardiac dysfunction: Prior history of this EF now around 50% on medical therapy. Plan to restart therapy, will need close echo follow up. Will increase metoprolol  succinate given palpitations, if persistent at next  visit would recommend zio. - Increase metoprolol  to 25mg  BID - Ok to restart trastuzumab  cardiomyopathy, CMR reviewed - Echo in 3 months - Continue valsartan  20mg  BID, BP limits significant titration - Would continue cardioprotective medications  Smoking: Encouraged cessation  RA: On methotrexate and HCQ. Does not appear to be related to her cardiomyopathy.   Follow up in 3 months with Dr. Rolan Morene Brownie, MD [facility] Mechanical Circulatory Support 06/19/24

## 2024-06-19 ENCOUNTER — Inpatient Hospital Stay (HOSPITAL_BASED_OUTPATIENT_CLINIC_OR_DEPARTMENT_OTHER): Admitting: Oncology

## 2024-06-19 ENCOUNTER — Other Ambulatory Visit: Payer: Self-pay

## 2024-06-19 ENCOUNTER — Inpatient Hospital Stay: Attending: Oncology

## 2024-06-19 ENCOUNTER — Encounter: Payer: Self-pay | Admitting: Oncology

## 2024-06-19 ENCOUNTER — Inpatient Hospital Stay

## 2024-06-19 VITALS — BP 123/73 | HR 77 | Temp 96.2°F | Resp 19

## 2024-06-19 VITALS — BP 109/67 | HR 95 | Temp 97.3°F | Resp 18 | Ht 67.0 in | Wt 187.0 lb

## 2024-06-19 DIAGNOSIS — C7951 Secondary malignant neoplasm of bone: Secondary | ICD-10-CM | POA: Diagnosis not present

## 2024-06-19 DIAGNOSIS — Z79620 Long term (current) use of immunosuppressive biologic: Secondary | ICD-10-CM | POA: Insufficient documentation

## 2024-06-19 DIAGNOSIS — C50811 Malignant neoplasm of overlapping sites of right female breast: Secondary | ICD-10-CM | POA: Diagnosis not present

## 2024-06-19 DIAGNOSIS — C50411 Malignant neoplasm of upper-outer quadrant of right female breast: Secondary | ICD-10-CM | POA: Diagnosis present

## 2024-06-19 DIAGNOSIS — Z170 Estrogen receptor positive status [ER+]: Secondary | ICD-10-CM

## 2024-06-19 DIAGNOSIS — R7989 Other specified abnormal findings of blood chemistry: Secondary | ICD-10-CM

## 2024-06-19 DIAGNOSIS — D649 Anemia, unspecified: Secondary | ICD-10-CM

## 2024-06-19 DIAGNOSIS — Z5112 Encounter for antineoplastic immunotherapy: Secondary | ICD-10-CM

## 2024-06-19 DIAGNOSIS — E876 Hypokalemia: Secondary | ICD-10-CM

## 2024-06-19 DIAGNOSIS — G620 Drug-induced polyneuropathy: Secondary | ICD-10-CM

## 2024-06-19 DIAGNOSIS — Z5111 Encounter for antineoplastic chemotherapy: Secondary | ICD-10-CM | POA: Diagnosis present

## 2024-06-19 DIAGNOSIS — Z79899 Other long term (current) drug therapy: Secondary | ICD-10-CM | POA: Insufficient documentation

## 2024-06-19 DIAGNOSIS — M069 Rheumatoid arthritis, unspecified: Secondary | ICD-10-CM

## 2024-06-19 DIAGNOSIS — I428 Other cardiomyopathies: Secondary | ICD-10-CM

## 2024-06-19 DIAGNOSIS — T451X5A Adverse effect of antineoplastic and immunosuppressive drugs, initial encounter: Secondary | ICD-10-CM

## 2024-06-19 LAB — FERRITIN: Ferritin: 29 ng/mL (ref 11–307)

## 2024-06-19 LAB — CBC WITH DIFFERENTIAL/PLATELET
Abs Immature Granulocytes: 0.02 K/uL (ref 0.00–0.07)
Basophils Absolute: 0 K/uL (ref 0.0–0.1)
Basophils Relative: 0 %
Eosinophils Absolute: 0.1 K/uL (ref 0.0–0.5)
Eosinophils Relative: 2 %
HCT: 29.9 % — ABNORMAL LOW (ref 36.0–46.0)
Hemoglobin: 9.9 g/dL — ABNORMAL LOW (ref 12.0–15.0)
Immature Granulocytes: 0 %
Lymphocytes Relative: 32 %
Lymphs Abs: 2.3 K/uL (ref 0.7–4.0)
MCH: 29.4 pg (ref 26.0–34.0)
MCHC: 33.1 g/dL (ref 30.0–36.0)
MCV: 88.7 fL (ref 80.0–100.0)
Monocytes Absolute: 0.7 K/uL (ref 0.1–1.0)
Monocytes Relative: 9 %
Neutro Abs: 4.1 K/uL (ref 1.7–7.7)
Neutrophils Relative %: 57 %
Platelets: 246 K/uL (ref 150–400)
RBC: 3.37 MIL/uL — ABNORMAL LOW (ref 3.87–5.11)
RDW: 14.6 % (ref 11.5–15.5)
WBC: 7.3 K/uL (ref 4.0–10.5)
nRBC: 0 % (ref 0.0–0.2)

## 2024-06-19 LAB — COMPREHENSIVE METABOLIC PANEL WITH GFR
ALT: 19 U/L (ref 0–44)
AST: 27 U/L (ref 15–41)
Albumin: 3.8 g/dL (ref 3.5–5.0)
Alkaline Phosphatase: 66 U/L (ref 38–126)
Anion gap: 5 (ref 5–15)
BUN: 20 mg/dL (ref 6–20)
CO2: 22 mmol/L (ref 22–32)
Calcium: 8.5 mg/dL — ABNORMAL LOW (ref 8.9–10.3)
Chloride: 108 mmol/L (ref 98–111)
Creatinine, Ser: 1.12 mg/dL — ABNORMAL HIGH (ref 0.44–1.00)
GFR, Estimated: >60 mL/min (ref >60)
Glucose, Bld: 108 mg/dL — ABNORMAL HIGH (ref 70–99)
Potassium: 3.4 mmol/L — ABNORMAL LOW (ref 3.5–5.1)
Sodium: 135 mmol/L (ref 135–145)
Total Bilirubin: 0.5 mg/dL (ref 0.0–1.2)
Total Protein: 6.7 g/dL (ref 6.5–8.1)

## 2024-06-19 LAB — RETIC PANEL
Immature Retic Fract: 7 % (ref 2.3–15.9)
RBC.: 3.38 MIL/uL — ABNORMAL LOW (ref 3.87–5.11)
Retic Count, Absolute: 62.9 K/uL (ref 19.0–186.0)
Retic Ct Pct: 1.9 % (ref 0.4–3.1)
Reticulocyte Hemoglobin: 31.5 pg (ref >27.9)

## 2024-06-19 LAB — IRON AND TIBC
Iron: 91 ug/dL (ref 28–170)
Saturation Ratios: 22 % (ref 10.4–31.8)
TIBC: 413 ug/dL (ref 250–450)
UIBC: 322 ug/dL

## 2024-06-19 MED ORDER — HEPARIN SOD (PORK) LOCK FLUSH 100 UNIT/ML IV SOLN
500.0000 [IU] | Freq: Once | INTRAVENOUS | Status: DC | PRN
Start: 2024-06-19 — End: 2024-06-19
  Filled 2024-06-19: qty 5

## 2024-06-19 MED ORDER — FULVESTRANT 250 MG/5ML IM SOSY
500.0000 mg | PREFILLED_SYRINGE | INTRAMUSCULAR | Status: DC
  Administered 2024-06-19: 500 mg via INTRAMUSCULAR
  Filled 2024-06-19: qty 10

## 2024-06-19 MED ORDER — TRASTUZUMAB-ANNS CHEMO 150 MG IV SOLR
6.0000 mg/kg | Freq: Once | INTRAVENOUS | Status: AC
  Administered 2024-06-19: 462 mg via INTRAVENOUS
  Filled 2024-06-19: qty 22

## 2024-06-19 MED ORDER — SODIUM CHLORIDE 0.9 % IV SOLN
420.0000 mg | Freq: Once | INTRAVENOUS | Status: AC
  Administered 2024-06-19: 420 mg via INTRAVENOUS
  Filled 2024-06-19: qty 14

## 2024-06-19 MED ORDER — SODIUM CHLORIDE 0.9 % IV SOLN
Freq: Once | INTRAVENOUS | Status: AC
  Filled 2024-06-19: qty 250

## 2024-06-19 NOTE — Assessment & Plan Note (Signed)
 Encourage oral hydration and avoid nephrotoxins.

## 2024-06-19 NOTE — Assessment & Plan Note (Signed)
Antibody treatment plan as list above. 

## 2024-06-19 NOTE — Assessment & Plan Note (Signed)
continue Lyrica and nortriptyline symptoms stable 

## 2024-06-19 NOTE — Assessment & Plan Note (Signed)
 Check iron tibc ferritin, retic panel, B12 and folate.

## 2024-06-19 NOTE — Progress Notes (Signed)
 Hematology/Oncology Progress note [phone] [phone]    REASON FOR VISIT Follow up for  treatment of breast cancer   ASSESSMENT & PLAN:   Cancer Staging  Breast cancer of upper-outer quadrant of right female breast (HCC) Staging form: Breast, AJCC 8th Edition - Clinical: G2, ER+, PR+, HER2- - Signed by Babara Call, MD on 04/05/2018 - Pathologic stage from 04/04/2018: ypT3, ypN2, cM0, G3, ER+, PR-, HER2- - Signed by Babara Call, MD on 04/04/2018 - Pathologic: Stage IV (rpTX, rpNX, rcM1, ER+, PR-, HER2+) - Signed by Babara Call, MD on 12/10/2020   Breast cancer of upper-outer quadrant of right female breast (HCC) Recurrent breast cancer with isolated bone metastasis, stage IV #Initially stage IIIA right breast cancer, ER positive, HER-2 negative status post bilateral oophorectomy,Right mastectomy and right axillary lymph node dissection, status post implant, and implant removal.  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 ER positive, HER-2 positive  .- s/p Palliative radiation to T7  10/20/23 PET NED,10/17/23 normal bone density on DEXA 624/2025 PET NED  ECHO showed improved LVEF and cardiology clears patient to resume treatments Labs reviewed and discussed Proceed with Kanjinti  Perjecta Continue fulvestrant  monthly.  Encounter for monoclonal antibody treatment for malignancy Antibody treatment plan as list above.  Hypocalcemia Continue calcium  supplementation 600mg  TID.    Hypokalemia She does not tolerate oral potassium.  Potassium is stable.  Malignant neoplasm metastatic to bone [facility]) status post radiation. Continue Zometa  monthly, hold if Calcium  is <8.6 . Hold due to left lower extremity stress fracture.  Continue calcium  supplementation.    Neuropathy due to chemotherapeutic drug (HCC) continue Lyrica  and nortriptyline symptoms stable  Nonischemic cardiomyopathy  (HCC) Chronic tachycardia Follow up with cardiology. She gets ECHO Q3 months.   Normocytic anemia Check iron tibc ferritin, retic panel, B12 and folate.   Rheumatoid arthritis (HCC) Follow up with  rheumatology.  Patient is on methotrexate,  plaquenil.    Elevated serum creatinine Encourage oral hydration and avoid nephrotoxins.    Patient declines influenza vaccination.   Orders Placed This Encounter  Procedures   Comprehensive metabolic panel    Standing Status:   Future    Expected Date:   07/10/2024    Expiration Date:   07/10/2025   CBC with Differential    Standing Status:   Future    Expected Date:   07/10/2024    Expiration Date:   07/10/2025   Comprehensive metabolic panel    Standing Status:   Future    Expected Date:   07/31/2024    Expiration Date:   07/31/2025   CBC with Differential    Standing Status:   Future    Expected Date:   07/31/2024    Expiration Date:   07/31/2025   Iron and TIBC    Standing Status:   Future    Number of Occurrences:   1    Expected Date:   06/19/2024    Expiration Date:   09/17/2024   Ferritin    Standing Status:   Future    Number of Occurrences:   1    Expected Date:   06/19/2024    Expiration Date:   09/17/2024   Retic Panel    Standing Status:   Future    Number of Occurrences:   1    Expected Date:   06/19/2024    Expiration Date:   09/17/2024   Vitamin B12    Standing  Status:   Future    Expected Date:   07/10/2024    Expiration Date:   07/10/2025   Follow up  lab Kanjinti  Perjecta 3 weeks.  lab MD Kanjinti  Perjecta 6 weeks.  fulvestrant  Q 4 weeks   All questions were answered. The patient knows to call the clinic with any problems, questions or concerns.  Zelphia Cap, MD, PhD [facility] Hematology Oncology 06/19/2024       Oncology History  Oncology History Overview Note        Malignant neoplasm of overlapping sites of right breast in female, estrogen receptor positive (HCC)  Breast cancer of upper-outer  quadrant of right female breast (HCC)  10/19/2017 Initial Diagnosis   Malignant neoplasm of overlapping sites of right breast in female, estrogen receptor positive (HCC)   11/08/2017 - 01/03/2018 Chemotherapy   Neoadjuvant ddAC x 4 + 1 cycle of Taxol   due to lack of response, surgery was offered.   03/19/2018 Surgery   S/p right mastectomy and right axillary dissection, immediate breast reconstruction with placement of expanders.  ypT3 ypN2, + lymphovascular invasion,  Grade 3, margin is negative, close. ER 90%, PR 0%, HER2 IHC negative.   # elective bilateral salpingo-oophorectomy..    06/11/2018 Imaging   s/p 11 cycles Taxol  adjuvantly   10/10/2018 -  Radiation Therapy   adjuvant right chest wall radiation   06/03/2019 Surgery   underwent elective left prophylactic mastectomy and sentinel lymph node biopsy of left axilla Pathology negative for malignancy  # Mediport removal  # right implant removal on    07/07/2020 Imaging   MRI thoracic spine without contrast showed lesions involving the T7 posterior elements most concerning for metastatic lesion.  No evidence of epidural tumor.  Minimal thoracic spondylosis without stenosis. MRI was reviewed by me and a PET scan was obtained for further evaluation   07/20/2020 Imaging   PET scan showed hypermetabolic metastasis involving the posterior element of T7, no additional evidence of metastasis in the neck, chest, abdomen or pelvis.   07/29/2020 Procedure   T7 lesion biopsy showed metastatic carcinoma, compatible with breast origin.  Receptor status staining showed ER 71-80% positive, PR negative, HER-2 positive IHC 3+   08/31/2020 -  Radiation Therapy    finished spine radiation    09/17/2020 -  Chemotherapy   Patient is on Treatment Plan :  BREAST Trastuzumab  + Pertuzumab  q21d     12/10/2020 Cancer Staging   Staging form: Breast, AJCC 8th Edition - Pathologic: Stage IV (rpTX, pNX, cM1, ER+, PR-, HER2+) - Signed by Cap Zelphia, MD on  12/10/2020   03/15/2021 Imaging    PET scan showed no focal hypermetabolic activity to suggest skeletal metastasis.  Mild hypermetabolic activity along the right T7-8 paraspinal musculature.  Max SUV 3.3.  Likely postprocedural-   09/28/2021 Echocardiogram   further decrease of LVEF to 45%   10/14/2021 Imaging   PET  Post bilateral mastectomy and RIGHT axillary dissection without signs of recurrent or metastatic disease.   11/12/2021 Echocardiogram   LVEF of 45-50%.   02/18/2022 Imaging   MRI thoracic and lumbar spine w wo contrast  1. Negative MRIs of the thoracic and lumbar spine. No evidence for locally recurrent metastasis at the level of T7. No other new metastatic disease elsewhere with thoracolumbar spine. 2. No significant disc pathology, stenosis, or evidence for neural impingement.    02/25/2022 Echocardiogram   LVEF 55-60%.   03/16/2022 Imaging   PET showed Stable PET-CT. No findings for  local recurrent breast cancer,locoregional adenopathy or distant metastatic disease.    07/20/2022 Imaging   MRI Thoracic Spine w wo contrast 1. Interval resolution of the increased T2 signal contrast enhancement in the posterior elements of T7. No new lesion identified. 2. No spinal canal or neural foraminal stenosis.   08/23/2022 Echocardiogram   Stable low-normal LVEF at 50-55%.   10/05/2022 Imaging   PET restaging Stable examination without evidence of hypermetabolic recurrence or metastatic disease.   Persistent minimally metabolic stranding in the posterior bilateral gluteal soft tissues may reflect sequela of subcutaneous injections   02/24/2023 Echocardiogram   Echocardiogram showed stable low normal LVEF 50-55%.   04/07/2023 Imaging   PET scan  1. Status post bilateral mastectomies without evidence of hypermetabolic local recurrence. 2. No evidence of hypermetabolic metastatic disease. 3. Asymmetric right-sided tonsillar FDG avidity is nonspecific consider further evaluation  with direct visualization. 4. Gas fluid levels in the proximal colon may reflect a diarrheal illness or laxative use.   02/27/2024 Echocardiogram   LVEF 35-40%    +MRSA infection of lowe extremity, finished a course of doxycycline    INTERVAL HISTORY 43 yo female with above oncology history reviewed by me presents for follow-up of management of metastatic Triple positive breast cancer.  Chronic back pain, unchanged.  Neck/upper back pain for 1 week Intermittent diarrhea, manageable.  No fever or chills. + arthralgia + right lower back pain, intermittent.  Off anti HER 2 treatment due to LVEF reduction on recent Echo. She follows up with cardiology. She denies lower extremity swelling.  + mild Sob with exertion.      Review of Systems  Constitutional:  Negative for chills, fever, malaise/fatigue and weight loss.  HENT:  Negative for sore throat.   Eyes:  Negative for redness.  Respiratory:  Negative for cough, shortness of breath and wheezing.   Cardiovascular:  Negative for chest pain, palpitations and leg swelling.  Gastrointestinal:  Negative for abdominal pain, blood in stool, heartburn, nausea and vomiting.  Genitourinary:  Negative for dysuria.  Musculoskeletal:  Positive for back pain and joint pain.  Skin:  Negative for rash.  Neurological:  Positive for tingling. Negative for dizziness and tremors.  Endo/Heme/Allergies:  Does not bruise/bleed easily.  Psychiatric/Behavioral:  Negative for hallucinations. The patient does not have insomnia.     No Known Allergies  Patient Active Problem List   Diagnosis Date Noted   Breast cancer of upper-outer quadrant of right female breast (HCC) 03/19/2018    Priority: High   Low back pain radiating to right lower extremity 12/22/2023    Priority: Medium    Normocytic anemia 06/23/2023    Priority: Medium    Hypokalemia 06/03/2022    Priority: Medium    Malignant neoplasm metastatic to bone (HCC) 03/25/2021    Priority:  Medium    Encounter for monoclonal antibody treatment for malignancy 12/10/2020    Priority: Medium    Hypocalcemia 11/19/2020    Priority: Medium    Neuropathy due to chemotherapeutic drug (HCC) 08/11/2020    Priority: Medium    Rheumatoid arthritis (HCC) 10/13/2019    Priority: Medium    Nonischemic cardiomyopathy (HCC) 08/23/2018    Priority: Medium    Folliculitis and perifolliculitis 04/14/2023    Priority: Low   Abnormal bowel movement 04/14/2023    Priority: Low   Right shoulder pain 01/20/2023    Priority: Low   Thyroid  nodule 06/04/2022    Priority: Low   Encounter for antineoplastic chemotherapy 10/29/2020  Priority: Low   Goals of care, counseling/discussion 08/11/2020    Priority: Low   Gastroesophageal reflux disease without esophagitis 02/24/2017    Priority: Low   Malignant tumor of breast (HCC) 07/11/2023   Stress fracture of metatarsal bone of right foot 07/11/2023   Change in bowel habits 06/23/2023   Closed fracture of fifth metatarsal bone 05/09/2023   Tachycardia, unspecified 04/15/2021   Bone lesion 11/19/2020   Inflammatory arthritis 11/19/2020   HER2-positive carcinoma of breast (HCC) 08/11/2020   Bilateral hand swelling 10/03/2019   Fracture of neck of metacarpal bone 05/14/2019   Chronic fatigue 04/09/2019   Polyarthralgia 04/09/2019   Status post right breast reconstruction 02/26/2019   Status post right mastectomy 02/26/2019   Mastalgia 02/15/2019   Shortness of breath 08/23/2018   Preprocedural cardiovascular examination 08/23/2018   Tachycardia 08/23/2018   Palpitations 08/23/2018   Estrogen receptor positive status (ER+) 04/04/2018   Acquired absence of right breast and nipple 04/03/2018   Family history of cancer    Malignant neoplasm of overlapping sites of right breast in female, estrogen receptor positive (HCC) 10/19/2017   Generalized anxiety disorder 10/03/2014   Headache 10/03/2014     Past Medical History:  Diagnosis Date    Anemia    Arthritis    BRCA negative 11/26/2017   Breast cancer (HCC) 10/11/2017   Multifocal, ER positive, PR negative, HER-2 negative. ypT3 ypN2a 8.7 cm, 4/15 nodes   Cardiomyopathy (HCC)    a. 10/2017 Echo: EF 60-65%, no rwma, Gr1 DD, nl RV size/fxn; b. 04/2018 Echo: EF 55-60%, no rwma, Nl RV size/fxn; c. 08/2018 Echo: EF 45%, diff HK, ? HK of antsept wall. Gr1 DD. Mild MR. Mild LAE/RAE. Mod dil RV.    Chronic bronchitis (HCC) 11/2017   COPD (chronic obstructive pulmonary disease) (HCC)    MILD PER CXR   Depression    Family history of cancer    GERD (gastroesophageal reflux disease)    Headache    MIGRAINES   Heart murmur    ASYMPTOMATIC   Personal history of chemotherapy    current for right breast ca     Past Surgical History:  Procedure Laterality Date   AXILLARY LYMPH NODE DISSECTION Right 03/19/2018   Procedure: AXILLARY LYMPH NODE DISSECTION;  Surgeon: Dessa Reyes ORN, MD;  Location: [facility] ORS;  Service: General;  Laterality: Right;   BIOPSY  06/23/2023   Procedure: BIOPSY;  Surgeon: Therisa Bi, MD;  Location: [facility] ENDOSCOPY;  Service: Gastroenterology;;   BREAST BIOPSY Right 10/11/2017   12:30 posterior coil clip invasive mammary carcinoma   BREAST BIOPSY Right 10/11/2017   11:30 middle depth ribbon clip DCIS   BREAST BIOPSY Right 10/11/2017   5:30 anterior depth x shape invasive ductal carcinoma   BREAST IMPLANT REMOVAL Right 06/03/2019   Procedure: REMOVAL OF RIGHT BREAST IMPLANTS;  Surgeon: Lowery Estefana RAMAN, DO;  Location: [facility] ORS;  Service: Plastics;  Laterality: Right;   BREAST RECONSTRUCTION WITH PLACEMENT OF TISSUE EXPANDER AND FLEX HD (ACELLULAR HYDRATED DERMIS) Right 03/19/2018   Procedure: BREAST RECONSTRUCTION WITH PLACEMENT OF TISSUE EXPANDER AND FLEX HD (ACELLULAR HYDRATED DERMIS);  Surgeon: Lowery Estefana RAMAN, DO;  Location: [facility];  Service: Plastics;  Laterality: Right;   CARPAL TUNNEL RELEASE Bilateral 2020   CHOLECYSTECTOMY N/A 04/27/2020    Procedure: LAPAROSCOPIC CHOLECYSTECTOMY WITH INTRAOPERATIVE CHOLANGIOGRAM;  Surgeon: Dessa Reyes ORN, MD;  Location: [facility];  Service: General;  Laterality: N/A;   COLONOSCOPY WITH PROPOFOL  N/A 06/23/2023   Procedure: COLONOSCOPY WITH  PROPOFOL ;  Surgeon: Therisa Bi, MD;  Location: [facility] ENDOSCOPY;  Service: Gastroenterology;  Laterality: N/A;   ESOPHAGOGASTRODUODENOSCOPY (EGD) WITH PROPOFOL  N/A 04/17/2020   Procedure: ESOPHAGOGASTRODUODENOSCOPY (EGD) WITH PROPOFOL ;  Surgeon: Dessa Reyes ORN, MD;  Location: [facility] ENDOSCOPY;  Service: Endoscopy;  Laterality: N/A;  with biopsy   ESOPHAGOGASTRODUODENOSCOPY (EGD) WITH PROPOFOL  N/A 06/23/2023   Procedure: ESOPHAGOGASTRODUODENOSCOPY (EGD) WITH PROPOFOL ;  Surgeon: Therisa Bi, MD;  Location: [facility] ENDOSCOPY;  Service: Gastroenterology;  Laterality: N/A;   LAPAROSCOPIC BILATERAL SALPINGO OOPHERECTOMY Bilateral 03/19/2018   Procedure: LAPAROSCOPIC BILATERAL SALPINGO OOPHORECTOMY;  Surgeon: Verdon Keen, MD;  Location: [facility];  Service: Gynecology;  Laterality: Bilateral;   MASTECTOMY Right 03/2018   MASTECTOMY W/ SENTINEL NODE BIOPSY Right 03/19/2018   Procedure: MASTECTOMY WITH SENTINEL LYMPH NODE BIOPSY;  Surgeon: Dessa Reyes ORN, MD;  Location: [facility];  Service: General;  Laterality: Right;   PORT-A-CATH REMOVAL Left 06/03/2019   Procedure: REMOVAL PORT-A-CATH;  Surgeon: Dessa Reyes ORN, MD;  Location: [facility];  Service: General;  Laterality: Left;   PORTACATH PLACEMENT Left 10/24/2017   Procedure: INSERTION PORT-A-CATH;  Surgeon: Dessa Reyes ORN, MD;  Location: [facility];  Service: General;  Laterality: Left;   PORTACATH PLACEMENT Right 09/28/2020   Procedure: INSERTION PORT-A-CATH;  Surgeon: Dessa Reyes ORN, MD;  Location: [facility];  Service: General;  Laterality: Right;   REMOVAL OF TISSUE EXPANDER AND PLACEMENT OF IMPLANT Right 07/20/2018   Procedure: REMOVAL OF RIGHT BREAST TISSUE EXPANDER AND PLACEMENT OF IMPLANT;  Surgeon:  Lowery Estefana RAMAN, DO;  Location: [facility];  Service: Plastics;  Laterality: Right;   SIMPLE MASTECTOMY WITH AXILLARY SENTINEL NODE BIOPSY Left 06/03/2019   Procedure: SIMPLE MASTECTOMY LEFT;  Surgeon: Dessa Reyes ORN, MD;  Location: [facility];  Service: General;  Laterality: Left;    Social History   Socioeconomic History   Marital status: Married    Spouse name: Not on file   Number of children: Not on file   Years of education: Not on file   Highest education level: Not on file  Occupational History   Occupation: pharmacy tech    Comment: Acupuncturist [employer]   Tobacco Use   Smoking status: Former    Current packs/day: 0.00    Average packs/day: 0.5 packs/day for 18.0 years (9.0 ttl pk-yrs)    Types: Cigarettes    Start date: 06/21/2018    Quit date: 12/11/2022    Years since quitting: 1.5   Smokeless tobacco: Never  Vaping Use   Vaping status: Never Used  Substance and Sexual Activity   Alcohol use: No   Drug use: No   Sexual activity: Yes    Birth control/protection: Injection, Other-see comments    Comment: has had hysterectomy  Other Topics Concern   Not on file  Social History Narrative   Lives at home with husband and daughter   Social Drivers of Corporate investment banker Strain: Not on file  Food Insecurity: No Food Insecurity (12/25/2023)   Hunger Vital Sign    Worried About Running Out of Food in the Last Year: Never true    Ran Out of Food in the Last Year: Never true  Transportation Needs: No Transportation Needs (12/25/2023)   PRAPARE - Administrator, Civil Service (Medical): No    Lack of Transportation (Non-Medical): No  Physical Activity: Not on file  Stress: Not on file  Social Connections: Not on file  Intimate Partner Violence: Not on file  Family History  Problem Relation Age of Onset   Diabetes Father    Hypertension Father    Hyperlipidemia Father    Heart attack Father 15        mild   Melanoma Maternal Aunt        other aunts with BCC/SCC/Melanoma   Cervical cancer Maternal Aunt 46       daughter w/ cervical cancer as well   Lung cancer Maternal Aunt    Melanoma Maternal Uncle        other uncles with BCC/SCC/Melanoma   Breast cancer Paternal Aunt    Bladder Cancer Maternal Grandmother   Biological mother had Grave's disease.    Current Outpatient Medications:    acetaminophen  (TYLENOL ) 500 MG tablet, Take 500 mg by mouth every 6 (six) hours as needed., Disp: , Rfl:    albuterol  (VENTOLIN  HFA) 108 (90 Base) MCG/ACT inhaler, Inhale 2 puffs into the lungs every 6 (six) hours as needed for wheezing or shortness of breath. , Disp: , Rfl:    CALCIUM  CARBONATE-VITAMIN D  PO, Take 600-800 mg by mouth in the morning, at noon, and at bedtime., Disp: , Rfl:    chlorhexidine  (PERIDEX ) 0.12 % solution, Use as directed 15 mLs in the mouth or throat 2 (two) times daily., Disp: 120 mL, Rfl: 0   cyanocobalamin  (,VITAMIN B-12,) 1000 MCG/ML injection, Inject 1,000 mcg into the skin every 30 (thirty) days., Disp: , Rfl:    diphenoxylate -atropine  (LOMOTIL ) 2.5-0.025 MG tablet, Take 1 tablet by mouth 4 (four) times daily as needed for diarrhea or loose stools., Disp: 60 tablet, Rfl: 0   escitalopram  (LEXAPRO ) 20 MG tablet, Take 20 mg by mouth daily., Disp: , Rfl:    esomeprazole (NEXIUM) 40 MG capsule, Take 40 mg by mouth daily before breakfast. , Disp: , Rfl:    Eszopiclone 3 MG TABS, Take 3 mg by mouth at bedtime as needed (sleep). , Disp: , Rfl:    folic acid  (FOLVITE ) 1 MG tablet, Take 1 mg by mouth daily., Disp: , Rfl:    hydrocortisone 2.5 % cream, , Disp: , Rfl:    hydroxychloroquine (PLAQUENIL) 200 MG tablet, Take 200 mg by mouth daily., Disp: , Rfl:    hydrOXYzine  (ATARAX ) 10 MG tablet, Take 1 tablet (10 mg total) by mouth every 6 (six) hours as needed., Disp: 60 tablet, Rfl: 0   ibuprofen  (ADVIL ) 800 MG tablet, Take 800 mg by mouth every 8 (eight) hours as needed for  moderate pain., Disp: , Rfl:    loperamide  (IMODIUM ) 2 MG capsule, Take 1 tablet (2 mg total) by mouth See admin instructions. Take 2 tablets with onset of diarrhea, then take 1 tablet every 2 hours until diarrhea stops. Maximum 8 tablets in 24, Disp: 90 capsule, Rfl: 0   loratadine  (CLARITIN ) 10 MG tablet, Take 10 mg by mouth daily. , Disp: , Rfl:    LORazepam (ATIVAN) 1 MG tablet, Take 1 mg by mouth 3 (three) times daily., Disp: , Rfl:    Magnesium 500 MG TABS, Take 500 mg by mouth 2 (two) times daily., Disp: , Rfl:    methocarbamol  (ROBAXIN ) 500 MG tablet, Take by mouth., Disp: , Rfl:    methotrexate (RHEUMATREX) 2.5 MG tablet, Take 25 mg by mouth every Sunday. 10 tablets once a week, Disp: , Rfl:    metoprolol  succinate (TOPROL  XL) 25 MG 24 hr tablet, Take 1 tablet (25 mg total) by mouth 2 (two) times daily. Monitor for lightheadedness and dizziness., Disp:  180 tablet, Rfl: 1   mupirocin  ointment (BACTROBAN ) 2 %, Place 1 application into the nose 2 (two) times daily. Use in each nostril twice daily for five (5) days., Disp: 22 g, Rfl: 5   nortriptyline (PAMELOR) 10 MG capsule, Take 30 mg by mouth at bedtime. , Disp: , Rfl:    oxyCODONE  (OXY IR/ROXICODONE ) 5 MG immediate release tablet, Take 1 tablet (5 mg total) by mouth every 6 (six) hours as needed for moderate pain (pain score 4-6) or severe pain (pain score 7-10)., Disp: 90 tablet, Rfl: 0   phentermine (ADIPEX-P) 37.5 MG tablet, Take 37.5 mg by mouth daily before breakfast. , Disp: , Rfl:    pregabalin  (LYRICA ) 150 MG capsule, Take 150 mg by mouth 2 (two) times daily., Disp: , Rfl:    promethazine  (PHENERGAN ) 25 MG tablet, Take 1 tablet (25 mg total) by mouth every 8 (eight) hours as needed for nausea or vomiting., Disp: 90 tablet, Rfl: 0   pyridOXINE (VITAMIN B-6) 100 MG tablet, Take 100 mg by mouth daily., Disp: , Rfl:    topiramate (TOPAMAX) 50 MG tablet, Take 50 mg by mouth 2 (two) times daily. , Disp: , Rfl:    valsartan  (DIOVAN ) 40 MG  tablet, Take 0.5 tablets (20 mg total) by mouth 2 (two) times daily., Disp: 90 tablet, Rfl: 3   vitamin C (ASCORBIC ACID) 500 MG tablet, Take 500 mg by mouth daily., Disp: , Rfl:  No current facility-administered medications for this visit.  Facility-Administered Medications Ordered in Other Visits:    fulvestrant  (FASLODEX ) injection 500 mg, 500 mg, Intramuscular, Q30 days, Babara Call, MD, 500 mg at 06/19/24 1603   heparin  lock flush 100 unit/mL, 500 Units, Intravenous, Once, Babara Call, MD   heparin  lock flush 100 unit/mL, 500 Units, Intracatheter, Once PRN, Babara Call, MD   sodium chloride  flush (NS) 0.9 % injection 10 mL, 10 mL, Intravenous, PRN, Babara Call, MD, 10 mL at 11/19/18 1249   sodium chloride  flush (NS) 0.9 % injection 10 mL, 10 mL, Intravenous, Once, Babara Call, MD   Physical exam:  Vitals:   06/19/24 1355  BP: 109/67  Pulse: 95  Resp: 18  Temp: (!) 97.3 F (36.3 C)  TempSrc: Tympanic  SpO2: 100%  Weight: 187 lb (84.8 kg)  Height: 5' 7 (1.702 m)  ECOG 1 Physical Exam Constitutional:      Appearance: Normal appearance.  HENT:     Head: Normocephalic and atraumatic.  Eyes:     General: No scleral icterus. Neck:     Vascular: No JVD.  Cardiovascular:     Rate and Rhythm: Normal rate.  Pulmonary:     Effort: Pulmonary effort is normal. No respiratory distress.     Breath sounds: Normal breath sounds.  Abdominal:     General: There is no distension.     Palpations: Abdomen is soft.  Musculoskeletal:     Cervical back: Normal range of motion.  Lymphadenopathy:     Cervical: No cervical adenopathy.  Skin:    General: Skin is warm.     Findings: No erythema or rash.  Neurological:     Mental Status: She is alert and oriented to person, place, and time. Mental status is at baseline.     Cranial Nerves: No cranial nerve deficit.     Motor: No abnormal muscle tone.  Psychiatric:        Mood and Affect: Mood and affect normal.        Labs  Latest Ref Rng &  Units 06/19/2024    1:40 PM  CMP  Glucose 70 - 99 mg/dL 891   BUN 6 - 20 mg/dL 20   Creatinine 9.55 - 1.00 mg/dL 8.87   Sodium 864 - 854 mmol/L 135   Potassium 3.5 - 5.1 mmol/L 3.4   Chloride 98 - 111 mmol/L 108   CO2 22 - 32 mmol/L 22   Calcium  8.9 - 10.3 mg/dL 8.5   Total Protein 6.5 - 8.1 g/dL 6.7   Total Bilirubin 0.0 - 1.2 mg/dL 0.5   Alkaline Phos 38 - 126 U/L 66   AST 15 - 41 U/L 27   ALT 0 - 44 U/L 19       Latest Ref Rng & Units 06/19/2024    1:40 PM  CBC  WBC 4.0 - 10.5 K/uL 7.3   Hemoglobin 12.0 - 15.0 g/dL 9.9   Hematocrit 63.9 - 46.0 % 29.9   Platelets 150 - 400 K/uL 246    RADIOGRAPHIC STUDIES: I have personally reviewed the radiological images as listed and agreed with the findings in the report. NM PET Image Restag (PS) Skull Base To Thigh Result Date: 06/09/2024 CLINICAL DATA:  Subsequent treatment strategy for breast cancer. EXAM: NUCLEAR MEDICINE PET SKULL BASE TO THIGH TECHNIQUE: 10.1 mCi F-18 FDG was injected intravenously. Full-ring PET imaging was performed from the skull base to thigh after the radiotracer. CT data was obtained and used for attenuation correction and anatomic localization. Fasting blood glucose: 105 mg/dl COMPARISON:  89/69/7975. FINDINGS: Mediastinal blood pool activity: SUV max 2.0 Liver activity: SUV max NA NECK: No abnormal hypermetabolism. Incidental CT findings: None. CHEST: No abnormal hypermetabolism. Incidental CT findings: Right IJ Port-A-Cath terminates in the right atrium. Heart is enlarged. No pericardial or pleural effusion. Post radiation scarring in the apex of the right upper lobe. ABDOMEN/PELVIS: No abnormal hypermetabolism. Incidental CT findings: Cholecystectomy. SKELETON: No abnormal hypermetabolism. Incidental CT findings: Degenerative changes in the spine.  Bilateral mastectomies. IMPRESSION: No evidence of recurrent or metastatic disease. Electronically Signed   By: Newell Eke M.D.   On: 06/09/2024 16:08   MR CARDIAC  VELOCITY FLOW MAP Result Date: 05/22/2024 CLINICAL DATA:  Cardiomyopathy of uncertain etiology EXAM: CARDIAC MRI TECHNIQUE: The patient was scanned on a 1.5 Tesla GE magnet. A dedicated cardiac coil was used. Functional imaging was done using Fiesta sequences. 2,3, and 4 chamber views were done to assess for RWMA's. Modified Simpson's rule using a short axis stack was used to calculate an ejection fraction on a dedicated work [employer]. The patient received 8 cc of Gadavist . After 10 minutes inversion recovery sequences were used to assess for infiltration and scar tissue. FINDINGS: Limited images of the lung fields showed no gross abnormalities. Normal left ventricular size and wall thickness. Mild diffuse hypokinesis, LV EF 48%. Normal right ventricular size, RV EF 50%. Normal left and right atrial sizes. Trileaflet aortic valve, no significant stenosis or regurgitation. Trivial mitral regurgitation, regurgitant fraction 8%. First pass perfusion appears normal. On delayed enhancement imaging, there was no myocardial late gadolinium enhancement (LGE). MEASUREMENTS: MEASUREMENTS LVEDV 161 mL LVEDVi 81 mL/m2 LVSV 78 mL LVEF 48% RVEDV 132 mL RVEDVi 66 mL/m2 RVSV 66 mL RVEF 50% Aortic forward volume 72 mL Aortic regurgitant fraction 2% Global T1 1085, ECV 29% Global T2 51 (normal) IMPRESSION: 1.  Normal LV size with mild diffuse hypokinesis, LV EF 48%. 2.  Normal RV size and systolic function, RV EF 50%. 3. No  myocardial LGE, so no definitive evidence for prior MI, infiltrative disease, or myocarditis. 4.  Normal T1 and T2 indices. This appears to show a mild nonischemic cardiomyopathy. Dalton Mclean Electronically Signed   By: Ezra Shuck M.D.   On: 05/22/2024 13:57   MR CARDIAC VELOCITY FLOW MAP Result Date: 05/22/2024 CLINICAL DATA:  Cardiomyopathy of uncertain etiology EXAM: CARDIAC MRI TECHNIQUE: The patient was scanned on a 1.5 Tesla GE magnet. A dedicated cardiac coil was used.  Functional imaging was done using Fiesta sequences. 2,3, and 4 chamber views were done to assess for RWMA's. Modified Simpson's rule using a short axis stack was used to calculate an ejection fraction on a dedicated work [employer], [employer]. The patient received 8 cc of Gadavist . After 10 minutes inversion recovery sequences were used to assess for infiltration and scar tissue. FINDINGS: Limited images of the lung fields showed no gross abnormalities. Normal left ventricular size and wall thickness. Mild diffuse hypokinesis, LV EF 48%. Normal right ventricular size, RV EF 50%. Normal left and right atrial sizes. Trileaflet aortic valve, no significant stenosis or regurgitation. Trivial mitral regurgitation, regurgitant fraction 8%. First pass perfusion appears normal. On delayed enhancement imaging, there was no myocardial late gadolinium enhancement (LGE). MEASUREMENTS: MEASUREMENTS LVEDV 161 mL LVEDVi 81 mL/m2 LVSV 78 mL LVEF 48% RVEDV 132 mL RVEDVi 66 mL/m2 RVSV 66 mL RVEF 50% Aortic forward volume 72 mL Aortic regurgitant fraction 2% Global T1 1085, ECV 29% Global T2 51 (normal) IMPRESSION: 1.  Normal LV size with mild diffuse hypokinesis, LV EF 48%. 2.  Normal RV size and systolic function, RV EF 50%. 3. No myocardial LGE, so no definitive evidence for prior MI, infiltrative disease, or myocarditis. 4.  Normal T1 and T2 indices. This appears to show a mild nonischemic cardiomyopathy. Dalton Mclean Electronically Signed   By: Ezra Shuck M.D.   On: 05/22/2024 13:57   MR CARDIAC MORPHOLOGY W WO CONTRAST Result Date: 05/22/2024 CLINICAL DATA:  Cardiomyopathy of uncertain etiology EXAM: CARDIAC MRI TECHNIQUE: The patient was scanned on a 1.5 Tesla GE magnet. A dedicated cardiac coil was used. Functional imaging was done using Fiesta sequences. 2,3, and 4 chamber views were done to assess for RWMA's. Modified Simpson's rule using a short axis stack was used to calculate an ejection fraction on a  dedicated work [employer]. The patient received 8 cc of Gadavist . After 10 minutes inversion recovery sequences were used to assess for infiltration and scar tissue. FINDINGS: Limited images of the lung fields showed no gross abnormalities. Normal left ventricular size and wall thickness. Mild diffuse hypokinesis, LV EF 48%. Normal right ventricular size, RV EF 50%. Normal left and right atrial sizes. Trileaflet aortic valve, no significant stenosis or regurgitation. Trivial mitral regurgitation, regurgitant fraction 8%. First pass perfusion appears normal. On delayed enhancement imaging, there was no myocardial late gadolinium enhancement (LGE). MEASUREMENTS: MEASUREMENTS LVEDV 161 mL LVEDVi 81 mL/m2 LVSV 78 mL LVEF 48% RVEDV 132 mL RVEDVi 66 mL/m2 RVSV 66 mL RVEF 50% Aortic forward volume 72 mL Aortic regurgitant fraction 2% Global T1 1085, ECV 29% Global T2 51 (normal) IMPRESSION: 1.  Normal LV size with mild diffuse hypokinesis, LV EF 48%. 2.  Normal RV size and systolic function, RV EF 50%. 3. No myocardial LGE, so no definitive evidence for prior MI, infiltrative disease, or myocarditis. 4.  Normal T1 and T2 indices. This appears to show a mild nonischemic cardiomyopathy. Dalton [employer] Electronically Signed   By: Ezra  Mclean M.D.   On: 05/22/2024 13:57   ECHOCARDIOGRAM COMPLETE Result Date: 05/14/2024    ECHOCARDIOGRAM REPORT   Patient Name:   Theresa Chaney Date of Exam: 05/14/2024 Medical Rec #:  [id number]      Height:       67.0 in Accession #:    [id number]     Weight:       183.6 lb Date of Birth:  September 01, 1981      BSA:          1.950 m Patient Age:    43 years       BP:           103/59 mmHg Patient Gender: F              HR:           96 bpm. Exam Location:  [facility] Procedure: 2D Echo, Cardiac Doppler, Color Doppler, Strain Analysis and 3D Echo            (Both Spectral and Color Flow Doppler were utilized during            procedure). Indications:     Cardiomyopathy, unspecified I42.9                   Nonischemic cardiomyopathy I42.8  History:         Patient has prior history of Echocardiogram examinations, most                  recent 02/27/2024. Cardiomyopathy; COPD. Masectomy.  Sonographer:     Christopher Furnace Referring Phys:  [id number] EZRA GORMAN SHUCK Diagnosing Phys: Lonni Hanson MD  Sonographer Comments: Global longitudinal strain was attempted. IMPRESSIONS  1. Left ventricular ejection fraction, by estimation, is 50 to 55%. Left ventricular ejection fraction by 3D volume is 54 %. The left ventricle has low normal function. The left ventricle has no regional wall motion abnormalities. Left ventricular diastolic parameters were normal. The average left ventricular global longitudinal strain is -14.7 %. The global longitudinal strain is abnormal.  2. Right ventricular systolic function is normal. The right ventricular size is normal. Tricuspid regurgitation signal is inadequate for assessing PA pressure.  3. The mitral valve is normal in structure. Mild mitral valve regurgitation. No evidence of mitral stenosis.  4. The aortic valve is tricuspid. Aortic valve regurgitation is not visualized. No aortic stenosis is present.  5. The inferior vena cava is normal in size with greater than 50% respiratory variability, suggesting right atrial pressure of 3 mmHg. FINDINGS  Left Ventricle: Left ventricular ejection fraction, by estimation, is 50 to 55%. Left ventricular ejection fraction by 3D volume is 54 %. The left ventricle has low normal function. The left ventricle has no regional wall motion abnormalities. The average left ventricular global longitudinal strain is -14.7 %. Strain was performed and the global longitudinal strain is abnormal. The left ventricular internal cavity size was normal in size. There is no left ventricular hypertrophy. Left ventricular diastolic parameters were normal. Right Ventricle: The right ventricular size is normal. No increase in right ventricular wall thickness. Right  ventricular systolic function is normal. Tricuspid regurgitation signal is inadequate for assessing PA pressure. Left Atrium: Left atrial size was normal in size. Right Atrium: Right atrial size was normal in size. Pericardium: There is no evidence of pericardial effusion. Mitral Valve: The mitral valve is normal in structure. Mild mitral valve regurgitation. No evidence of mitral valve stenosis. MV peak gradient, 3.0 mmHg. The mean  mitral valve gradient is 2.0 mmHg. Tricuspid Valve: The tricuspid valve is normal in structure. Tricuspid valve regurgitation is mild. Aortic Valve: The aortic valve is tricuspid. Aortic valve regurgitation is not visualized. No aortic stenosis is present. Aortic valve mean gradient measures 3.0 mmHg. Aortic valve peak gradient measures 5.5 mmHg. Aortic valve area, by VTI measures 2.56 cm. Pulmonic Valve: The pulmonic valve was normal in structure. Pulmonic valve regurgitation is trivial. No evidence of pulmonic stenosis. Aorta: The aortic root is normal in size and structure. Pulmonary Artery: The pulmonary artery is of normal size. Venous: The inferior vena cava is normal in size with greater than 50% respiratory variability, suggesting right atrial pressure of 3 mmHg. IAS/Shunts: No atrial level shunt detected by color flow Doppler. Additional Comments: 3D was performed not requiring image post processing on an independent workstation and was normal. A venous catheter is visualized in the right atrium.  LEFT VENTRICLE PLAX 2D LVIDd:         4.70 cm         Diastology LVIDs:         3.40 cm         LV e' medial:    11.10 cm/s LV PW:         0.87 cm         LV E/e' medial:  7.2 LV IVS:        0.66 cm         LV e' lateral:   14.50 cm/s LVOT diam:     2.20 cm         LV E/e' lateral: 5.5 LV SV:         61 LV SV Index:   31              2D Longitudinal LVOT Area:     3.80 cm        Strain                                2D Strain GLS   -14.7 %                                Avg:                                  3D Volume EF                                LV 3D EF:    Left                                             ventricul                                             ar  ejection                                             fraction                                             by 3D                                             volume is                                             54 %.                                 3D Volume EF:                                3D EF:        54 % RIGHT VENTRICLE RV Basal diam:  2.70 cm RV Mid diam:    2.40 cm LEFT ATRIUM             Index        RIGHT ATRIUM          Index LA diam:        2.60 cm 1.33 cm/m   RA Area:     8.66 cm LA Vol (A2C):   27.6 ml 14.15 ml/m  RA Volume:   18.00 ml 9.23 ml/m LA Vol (A4C):   27.4 ml 14.05 ml/m LA Biplane Vol: 27.4 ml 14.05 ml/m  AORTIC VALVE AV Area (Vmax):    2.28 cm AV Area (Vmean):   2.34 cm AV Area (VTI):     2.56 cm AV Vmax:           117.00 cm/s AV Vmean:          86.600 cm/s AV VTI:            0.239 m AV Peak Grad:      5.5 mmHg AV Mean Grad:      3.0 mmHg LVOT Vmax:         70.30 cm/s LVOT Vmean:        53.200 cm/s LVOT VTI:          0.161 m LVOT/AV VTI ratio: 0.67  AORTA Ao Root diam: 2.60 cm MITRAL VALVE MV Area (PHT): 3.93 cm    SHUNTS MV Area VTI:   3.09 cm    Systemic VTI:  0.16 m MV Peak grad:  3.0 mmHg    Systemic Diam: 2.20 cm MV Mean grad:  2.0 mmHg MV Vmax:       0.86 m/s MV Vmean:      61.6 cm/s MV Decel Time: 193 msec MV E velocity: 79.70 cm/s MV A velocity: 62.90 cm/s MV E/A ratio:  1.27 Lonni Hanson MD Electronically signed by Lonni Hanson MD Signature Date/Time: 05/14/2024/5:25:57  PM    Final

## 2024-06-19 NOTE — Progress Notes (Signed)
 Patient had a PET scan on 06/04/2024.

## 2024-06-19 NOTE — Assessment & Plan Note (Signed)
She does not tolerate oral potassium.  Potassium is stable. 

## 2024-06-19 NOTE — Progress Notes (Signed)
 Per Dr. Babara  Continue with maintenance doses of trastuzumab /perjeta  without reloading. Patient had been off them due to cardiotoxicity.   Maudie FORBES Andreas, PharmD, BCPS Clinical Pharmacist

## 2024-06-19 NOTE — Assessment & Plan Note (Signed)
Follow up with  rheumatology.  Patient is on methotrexate,  plaquenil.  

## 2024-06-19 NOTE — Assessment & Plan Note (Signed)
 Recurrent breast cancer with isolated bone metastasis, stage IV #Initially stage IIIA right breast cancer, ER positive, HER-2 negative status post bilateral oophorectomy,Right mastectomy and right axillary lymph node dissection, status post implant, and implant removal.  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 ER positive, HER-2 positive  .- s/p Palliative radiation to T7  10/20/23 PET NED,10/17/23 normal bone density on DEXA 624/2025 PET NED  ECHO showed improved LVEF and cardiology clears patient to resume treatments Labs reviewed and discussed Proceed with Kanjinti  Perjecta Continue fulvestrant  monthly.

## 2024-06-19 NOTE — Addendum Note (Signed)
 Addended by: Ramell Wacha E on: 06/19/2024 02:28 PM   Modules accepted: Orders

## 2024-06-19 NOTE — Assessment & Plan Note (Signed)
 status post radiation. Continue Zometa  monthly, hold if Calcium  is <8.6 . Hold due to left lower extremity stress fracture.  Continue calcium  supplementation.

## 2024-06-19 NOTE — Assessment & Plan Note (Addendum)
 Chronic tachycardia Follow up with cardiology. She gets ECHO Q3 months.

## 2024-06-19 NOTE — Assessment & Plan Note (Signed)
Continue calcium supplementation 600mg  TID.

## 2024-07-04 ENCOUNTER — Encounter: Payer: Self-pay | Admitting: Oncology

## 2024-07-10 ENCOUNTER — Inpatient Hospital Stay

## 2024-07-10 ENCOUNTER — Ambulatory Visit

## 2024-07-10 ENCOUNTER — Other Ambulatory Visit

## 2024-07-10 VITALS — BP 108/58 | HR 83 | Temp 97.7°F | Resp 18 | Wt 181.9 lb

## 2024-07-10 DIAGNOSIS — C50811 Malignant neoplasm of overlapping sites of right female breast: Secondary | ICD-10-CM

## 2024-07-10 DIAGNOSIS — Z5111 Encounter for antineoplastic chemotherapy: Secondary | ICD-10-CM | POA: Diagnosis not present

## 2024-07-10 LAB — COMPREHENSIVE METABOLIC PANEL WITH GFR
ALT: 19 U/L (ref 0–44)
AST: 28 U/L (ref 15–41)
Albumin: 4 g/dL (ref 3.5–5.0)
Alkaline Phosphatase: 71 U/L (ref 38–126)
Anion gap: 7 (ref 5–15)
BUN: 21 mg/dL — ABNORMAL HIGH (ref 6–20)
CO2: 22 mmol/L (ref 22–32)
Calcium: 8.8 mg/dL — ABNORMAL LOW (ref 8.9–10.3)
Chloride: 110 mmol/L (ref 98–111)
Creatinine, Ser: 1.05 mg/dL — ABNORMAL HIGH (ref 0.44–1.00)
GFR, Estimated: >60 mL/min (ref >60)
Glucose, Bld: 115 mg/dL — ABNORMAL HIGH (ref 70–99)
Potassium: 3.2 mmol/L — ABNORMAL LOW (ref 3.5–5.1)
Sodium: 139 mmol/L (ref 135–145)
Total Bilirubin: 0.5 mg/dL (ref 0.0–1.2)
Total Protein: 7.3 g/dL (ref 6.5–8.1)

## 2024-07-10 LAB — CBC WITH DIFFERENTIAL/PLATELET
Abs Immature Granulocytes: 0.02 K/uL (ref 0.00–0.07)
Basophils Absolute: 0 K/uL (ref 0.0–0.1)
Basophils Relative: 1 %
Eosinophils Absolute: 0.2 K/uL (ref 0.0–0.5)
Eosinophils Relative: 2 %
HCT: 31.4 % — ABNORMAL LOW (ref 36.0–46.0)
Hemoglobin: 10.6 g/dL — ABNORMAL LOW (ref 12.0–15.0)
Immature Granulocytes: 0 %
Lymphocytes Relative: 31 %
Lymphs Abs: 2.6 K/uL (ref 0.7–4.0)
MCH: 29.9 pg (ref 26.0–34.0)
MCHC: 33.8 g/dL (ref 30.0–36.0)
MCV: 88.5 fL (ref 80.0–100.0)
Monocytes Absolute: 0.7 K/uL (ref 0.1–1.0)
Monocytes Relative: 8 %
Neutro Abs: 4.9 K/uL (ref 1.7–7.7)
Neutrophils Relative %: 58 %
Platelets: 285 K/uL (ref 150–400)
RBC: 3.55 MIL/uL — ABNORMAL LOW (ref 3.87–5.11)
RDW: 14.1 % (ref 11.5–15.5)
WBC: 8.5 K/uL (ref 4.0–10.5)
nRBC: 0 % (ref 0.0–0.2)

## 2024-07-10 LAB — VITAMIN B12: Vitamin B-12: 466 pg/mL (ref 180–914)

## 2024-07-10 LAB — FOLATE: Folate: 26 ng/mL (ref >5.9)

## 2024-07-10 MED ORDER — SODIUM CHLORIDE 0.9% FLUSH
10.0000 mL | INTRAVENOUS | Status: DC | PRN
  Administered 2024-07-10: 10 mL
  Filled 2024-07-10: qty 10

## 2024-07-10 MED ORDER — SODIUM CHLORIDE 0.9 % IV SOLN
420.0000 mg | Freq: Once | INTRAVENOUS | Status: AC
  Administered 2024-07-10: 420 mg via INTRAVENOUS
  Filled 2024-07-10: qty 14

## 2024-07-10 MED ORDER — SODIUM CHLORIDE 0.9 % IV SOLN
Freq: Once | INTRAVENOUS | Status: AC
Start: 2024-07-10 — End: 2024-07-10
  Filled 2024-07-10: qty 250

## 2024-07-10 MED ORDER — HEPARIN SOD (PORK) LOCK FLUSH 100 UNIT/ML IV SOLN
500.0000 [IU] | Freq: Once | INTRAVENOUS | Status: AC | PRN
  Administered 2024-07-10: 500 [IU]
  Filled 2024-07-10: qty 5

## 2024-07-10 MED ORDER — TRASTUZUMAB-ANNS CHEMO 150 MG IV SOLR
6.0000 mg/kg | Freq: Once | INTRAVENOUS | Status: AC
  Administered 2024-07-10: 462 mg via INTRAVENOUS
  Filled 2024-07-10: qty 22

## 2024-07-10 NOTE — Patient Instructions (Signed)
 [facility] - A DEPT OF [facility]  Discharge Instructions: Thank you for choosing [facility] to provide your oncology and hematology care.  If you have a lab appointment with the Cancer Center, please go directly to the Cancer Center and check in at the registration area.  Wear comfortable clothing and clothing appropriate for easy access to any Portacath or PICC line.   We strive to give you quality time with your provider. You may need to reschedule your appointment if you arrive late (15 or more minutes).  Arriving late affects you and other patients whose appointments are after yours.  Also, if you miss three or more appointments without notifying the office, you may be dismissed from the clinic at the provider's discretion.      For prescription refill requests, have your pharmacy contact our office and allow 72 hours for refills to be completed.    Today you received the following chemotherapy and/or immunotherapy agents- trastuzumab, pertuzumab      To help prevent nausea and vomiting after your treatment, we encourage you to take your nausea medication as directed.  BELOW ARE SYMPTOMS THAT SHOULD BE REPORTED IMMEDIATELY: *FEVER GREATER THAN 100.4 F (38 C) OR HIGHER *CHILLS OR SWEATING *NAUSEA AND VOMITING THAT IS NOT CONTROLLED WITH YOUR NAUSEA MEDICATION *UNUSUAL SHORTNESS OF BREATH *UNUSUAL BRUISING OR BLEEDING *URINARY PROBLEMS (pain or burning when urinating, or frequent urination) *BOWEL PROBLEMS (unusual diarrhea, constipation, pain near the anus) TENDERNESS IN MOUTH AND THROAT WITH OR WITHOUT PRESENCE OF ULCERS (sore throat, sores in mouth, or a toothache) UNUSUAL RASH, SWELLING OR PAIN  UNUSUAL VAGINAL DISCHARGE OR ITCHING   Items with * indicate a potential emergency and should be followed up as soon as possible or go to the Emergency Department if any problems should occur.  Please show the CHEMOTHERAPY ALERT CARD or  IMMUNOTHERAPY ALERT CARD at check-in to the Emergency Department and triage nurse.  Should you have questions after your visit or need to cancel or reschedule your appointment, please contact [facility] - A DEPT OF [facility]  [phone] and follow the prompts.  Office hours are 8:00 a.m. to 4:30 p.m. Monday - Friday. Please note that voicemails left after 4:00 p.m. may not be returned until the following business day.  We are closed weekends and major holidays. You have access to a nurse at all times for urgent questions. Please call the main number to the clinic [phone] and follow the prompts.  For any non-urgent questions, you may also contact your provider using MyChart. We now offer e-Visits for anyone 26 and older to request care online for non-urgent symptoms. For details visit [email or web address].   Also download the MyChart app! Go to the app store, search "MyChart", open the app, select [place], and log in with your MyChart username and password.

## 2024-07-17 ENCOUNTER — Inpatient Hospital Stay: Attending: Oncology

## 2024-07-17 DIAGNOSIS — C50411 Malignant neoplasm of upper-outer quadrant of right female breast: Secondary | ICD-10-CM | POA: Insufficient documentation

## 2024-07-17 DIAGNOSIS — Z5111 Encounter for antineoplastic chemotherapy: Secondary | ICD-10-CM | POA: Diagnosis present

## 2024-07-17 DIAGNOSIS — C7951 Secondary malignant neoplasm of bone: Secondary | ICD-10-CM | POA: Insufficient documentation

## 2024-07-17 DIAGNOSIS — Z170 Estrogen receptor positive status [ER+]: Secondary | ICD-10-CM | POA: Diagnosis not present

## 2024-07-17 MED ORDER — FULVESTRANT 250 MG/5ML IM SOSY
500.0000 mg | PREFILLED_SYRINGE | INTRAMUSCULAR | Status: DC
  Administered 2024-07-17: 500 mg via INTRAMUSCULAR
  Filled 2024-07-17: qty 10

## 2024-07-22 ENCOUNTER — Other Ambulatory Visit: Payer: Self-pay

## 2024-07-22 MED ORDER — OXYCODONE HCL 5 MG PO TABS: 5.0000 mg | ORAL_TABLET | Freq: Four times a day (QID) | ORAL | 0 refills | Status: DC | PRN

## 2024-07-31 ENCOUNTER — Encounter: Payer: Self-pay | Admitting: Oncology

## 2024-07-31 ENCOUNTER — Inpatient Hospital Stay

## 2024-07-31 ENCOUNTER — Inpatient Hospital Stay: Admitting: Oncology

## 2024-07-31 VITALS — BP 121/72 | HR 81 | Temp 96.1°F | Resp 18 | Wt 180.8 lb

## 2024-07-31 VITALS — BP 100/75 | HR 84

## 2024-07-31 DIAGNOSIS — M069 Rheumatoid arthritis, unspecified: Secondary | ICD-10-CM

## 2024-07-31 DIAGNOSIS — D649 Anemia, unspecified: Secondary | ICD-10-CM

## 2024-07-31 DIAGNOSIS — E876 Hypokalemia: Secondary | ICD-10-CM

## 2024-07-31 DIAGNOSIS — C50411 Malignant neoplasm of upper-outer quadrant of right female breast: Secondary | ICD-10-CM

## 2024-07-31 DIAGNOSIS — Z5111 Encounter for antineoplastic chemotherapy: Secondary | ICD-10-CM | POA: Diagnosis not present

## 2024-07-31 DIAGNOSIS — Z170 Estrogen receptor positive status [ER+]: Secondary | ICD-10-CM

## 2024-07-31 DIAGNOSIS — I428 Other cardiomyopathies: Secondary | ICD-10-CM

## 2024-07-31 DIAGNOSIS — C50811 Malignant neoplasm of overlapping sites of right female breast: Secondary | ICD-10-CM | POA: Diagnosis not present

## 2024-07-31 DIAGNOSIS — R7989 Other specified abnormal findings of blood chemistry: Secondary | ICD-10-CM | POA: Diagnosis not present

## 2024-07-31 DIAGNOSIS — Z5112 Encounter for antineoplastic immunotherapy: Secondary | ICD-10-CM

## 2024-07-31 DIAGNOSIS — C7951 Secondary malignant neoplasm of bone: Secondary | ICD-10-CM

## 2024-07-31 LAB — CBC WITH DIFFERENTIAL/PLATELET
Abs Immature Granulocytes: 0.07 K/uL (ref 0.00–0.07)
Basophils Absolute: 0.1 K/uL (ref 0.0–0.1)
Basophils Relative: 0 %
Eosinophils Absolute: 0.2 K/uL (ref 0.0–0.5)
Eosinophils Relative: 1 %
HCT: 29.8 % — ABNORMAL LOW (ref 36.0–46.0)
Hemoglobin: 9.9 g/dL — ABNORMAL LOW (ref 12.0–15.0)
Immature Granulocytes: 1 %
Lymphocytes Relative: 17 %
Lymphs Abs: 2 K/uL (ref 0.7–4.0)
MCH: 29.7 pg (ref 26.0–34.0)
MCHC: 33.2 g/dL (ref 30.0–36.0)
MCV: 89.5 fL (ref 80.0–100.0)
Monocytes Absolute: 0.9 K/uL (ref 0.1–1.0)
Monocytes Relative: 7 %
Neutro Abs: 9.1 K/uL — ABNORMAL HIGH (ref 1.7–7.7)
Neutrophils Relative %: 74 %
Platelets: 282 K/uL (ref 150–400)
RBC: 3.33 MIL/uL — ABNORMAL LOW (ref 3.87–5.11)
RDW: 14 % (ref 11.5–15.5)
WBC: 12.3 K/uL — ABNORMAL HIGH (ref 4.0–10.5)
nRBC: 0 % (ref 0.0–0.2)

## 2024-07-31 LAB — COMPREHENSIVE METABOLIC PANEL WITH GFR
ALT: 16 U/L (ref 0–44)
AST: 24 U/L (ref 15–41)
Albumin: 3.8 g/dL (ref 3.5–5.0)
Alkaline Phosphatase: 72 U/L (ref 38–126)
Anion gap: 8 (ref 5–15)
BUN: 20 mg/dL (ref 6–20)
CO2: 21 mmol/L — ABNORMAL LOW (ref 22–32)
Calcium: 8.9 mg/dL (ref 8.9–10.3)
Chloride: 109 mmol/L (ref 98–111)
Creatinine, Ser: 0.83 mg/dL (ref 0.44–1.00)
GFR, Estimated: >60 mL/min (ref >60)
Glucose, Bld: 112 mg/dL — ABNORMAL HIGH (ref 70–99)
Potassium: 3.2 mmol/L — ABNORMAL LOW (ref 3.5–5.1)
Sodium: 138 mmol/L (ref 135–145)
Total Bilirubin: 0.4 mg/dL (ref 0.0–1.2)
Total Protein: 7.1 g/dL (ref 6.5–8.1)

## 2024-07-31 MED ORDER — SODIUM CHLORIDE 0.9 % IV SOLN
420.0000 mg | Freq: Once | INTRAVENOUS | Status: AC
  Administered 2024-07-31: 420 mg via INTRAVENOUS
  Filled 2024-07-31: qty 14

## 2024-07-31 MED ORDER — TRASTUZUMAB-ANNS CHEMO 150 MG IV SOLR
6.0000 mg/kg | Freq: Once | INTRAVENOUS | Status: AC
  Administered 2024-07-31: 462 mg via INTRAVENOUS
  Filled 2024-07-31: qty 22

## 2024-07-31 MED ORDER — SODIUM CHLORIDE 0.9 % IV SOLN
Freq: Once | INTRAVENOUS | Status: AC
  Filled 2024-07-31: qty 250

## 2024-07-31 NOTE — Assessment & Plan Note (Signed)
Continue calcium supplementation 600mg  TID.

## 2024-07-31 NOTE — Assessment & Plan Note (Addendum)
Improved. Encourage oral hydration and avoid nephrotoxins.

## 2024-07-31 NOTE — Assessment & Plan Note (Signed)
 status post radiation. hold if Calcium  is <8.6 . Hold zometa  due to left lower extremity stress fracture.  Continue calcium  supplementation.

## 2024-07-31 NOTE — Assessment & Plan Note (Signed)
 Chronic tachycardia Follow up with cardiology. She gets ECHO Q3 months.

## 2024-07-31 NOTE — Assessment & Plan Note (Signed)
Follow up with  rheumatology.  Patient is on methotrexate,  plaquenil.  

## 2024-07-31 NOTE — Assessment & Plan Note (Addendum)
 Normal iron tibc ferritin, normal  B12 and folate.  Monitor. - she can not tolerate oral iron supplementation due to diarrhea Anemia could be due to methotrexate, anemia of chronic disease.

## 2024-07-31 NOTE — Assessment & Plan Note (Signed)
 Recurrent breast cancer with isolated bone metastasis, stage IV #Initially stage IIIA right breast cancer, ER positive, HER-2 negative status post bilateral oophorectomy,Right mastectomy and right axillary lymph node dissection, status post implant, and implant removal.  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 ER positive, HER-2 positive  .- s/p Palliative radiation to T7  10/20/23 PET NED,10/17/23 normal bone density on DEXA 06/04/2024 PET NED  Labs reviewed and discussed Proceed with Kanjinti  Perjecta Continue fulvestrant  monthly.

## 2024-07-31 NOTE — Progress Notes (Signed)
 Hematology/Oncology Progress note [phone] [phone]    REASON FOR VISIT Follow up for  treatment of breast cancer   ASSESSMENT & PLAN:   Cancer Staging  Breast cancer of upper-outer quadrant of right female breast (HCC) Staging form: Breast, AJCC 8th Edition - Clinical: G2, ER+, PR+, HER2- - Signed by Babara Call, MD on 04/05/2018 - Pathologic stage from 04/04/2018: ypT3, ypN2, cM0, G3, ER+, PR-, HER2- - Signed by Babara Call, MD on 04/04/2018 - Pathologic: Stage IV (rpTX, rpNX, rcM1, ER+, PR-, HER2+) - Signed by Babara Call, MD on 12/10/2020   Breast cancer of upper-outer quadrant of right female breast (HCC) Recurrent breast cancer with isolated bone metastasis, stage IV #Initially stage IIIA right breast cancer, ER positive, HER-2 negative status post bilateral oophorectomy,Right mastectomy and right axillary lymph node dissection, status post implant, and implant removal.  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 ER positive, HER-2 positive  .- s/p Palliative radiation to T7  10/20/23 PET NED,10/17/23 normal bone density on DEXA 06/04/2024 PET NED  Labs reviewed and discussed Proceed with Kanjinti  Perjecta Continue fulvestrant  monthly.  Normocytic anemia Normal iron tibc ferritin, normal  B12 and folate.  Monitor. - she can not tolerate oral iron supplementation due to diarrhea Anemia could be due to methotrexate, anemia of chronic disease.   Elevated serum creatinine Improved.  Encourage oral hydration and avoid nephrotoxins.    Encounter for monoclonal antibody treatment for malignancy Antibody treatment plan as list above.  Hypocalcemia Continue calcium  supplementation 600mg  TID.    Hypokalemia She does not tolerate oral potassium.  Potassium is stable.  Malignant neoplasm metastatic to bone [facility]) status post radiation. hold if Calcium  is <8.6 . Hold zometa  due  to left lower extremity stress fracture.  Continue calcium  supplementation.    Nonischemic cardiomyopathy (HCC) Chronic tachycardia Follow up with cardiology. She gets ECHO Q3 months.   Rheumatoid arthritis (HCC) Follow up with  rheumatology.  Patient is on methotrexate,  plaquenil.     Orders Placed This Encounter  Procedures   Comprehensive metabolic panel    Standing Status:   Future    Expected Date:   09/11/2024    Expiration Date:   09/11/2025   CBC with Differential    Standing Status:   Future    Expected Date:   09/11/2024    Expiration Date:   09/11/2025   Comprehensive metabolic panel    Standing Status:   Future    Expected Date:   08/21/2024    Expiration Date:   08/21/2025   CBC with Differential    Standing Status:   Future    Expected Date:   08/21/2024    Expiration Date:   08/21/2025   Follow up  lab Kanjinti  Perjecta 3 weeks.  lab MD Kanjinti  Perjecta 6 weeks.  fulvestrant  Q 4 weeks   All questions were answered. The patient knows to call the clinic with any problems, questions or concerns.  Call Babara, MD, PhD [facility] Hematology Oncology 07/31/2024       Oncology History  Oncology History Overview Note        Malignant neoplasm of overlapping sites of right breast in female, estrogen receptor positive (HCC)  Breast cancer of upper-outer quadrant of right female breast (HCC)  10/19/2017 Initial Diagnosis   Malignant neoplasm of overlapping sites of right breast in female, estrogen receptor positive (HCC)   11/08/2017 - 01/03/2018 Chemotherapy   Neoadjuvant  ddAC x 4 + 1 cycle of Taxol   due to lack of response, surgery was offered.   03/19/2018 Surgery   S/p right mastectomy and right axillary dissection, immediate breast reconstruction with placement of expanders.  ypT3 ypN2, + lymphovascular invasion,  Grade 3, margin is negative, close. ER 90%, PR 0%, HER2 IHC negative.   # elective bilateral salpingo-oophorectomy..    06/11/2018 Imaging    s/p 11 cycles Taxol  adjuvantly   10/10/2018 -  Radiation Therapy   adjuvant right chest wall radiation   06/03/2019 Surgery   underwent elective left prophylactic mastectomy and sentinel lymph node biopsy of left axilla Pathology negative for malignancy  # Mediport removal  # right implant removal on    07/07/2020 Imaging   MRI thoracic spine without contrast showed lesions involving the T7 posterior elements most concerning for metastatic lesion.  No evidence of epidural tumor.  Minimal thoracic spondylosis without stenosis. MRI was reviewed by me and a PET scan was obtained for further evaluation   07/20/2020 Imaging   PET scan showed hypermetabolic metastasis involving the posterior element of T7, no additional evidence of metastasis in the neck, chest, abdomen or pelvis.   07/29/2020 Procedure   T7 lesion biopsy showed metastatic carcinoma, compatible with breast origin.  Receptor status staining showed ER 71-80% positive, PR negative, HER-2 positive IHC 3+   08/31/2020 -  Radiation Therapy    finished spine radiation    09/17/2020 -  Chemotherapy   Patient is on Treatment Plan :  BREAST Trastuzumab  + Pertuzumab  q21d     12/10/2020 Cancer Staging   Staging form: Breast, AJCC 8th Edition - Pathologic: Stage IV (rpTX, pNX, cM1, ER+, PR-, HER2+) - Signed by Babara Call, MD on 12/10/2020   03/15/2021 Imaging    PET scan showed no focal hypermetabolic activity to suggest skeletal metastasis.  Mild hypermetabolic activity along the right T7-8 paraspinal musculature.  Max SUV 3.3.  Likely postprocedural-   09/28/2021 Echocardiogram   further decrease of LVEF to 45%   10/14/2021 Imaging   PET  Post bilateral mastectomy and RIGHT axillary dissection without signs of recurrent or metastatic disease.   11/12/2021 Echocardiogram   LVEF of 45-50%.   02/18/2022 Imaging   MRI thoracic and lumbar spine w wo contrast  1. Negative MRIs of the thoracic and lumbar spine. No evidence for locally  recurrent metastasis at the level of T7. No other new metastatic disease elsewhere with thoracolumbar spine. 2. No significant disc pathology, stenosis, or evidence for neural impingement.    02/25/2022 Echocardiogram   LVEF 55-60%.   03/16/2022 Imaging   PET showed Stable PET-CT. No findings for local recurrent breast cancer,locoregional adenopathy or distant metastatic disease.    07/20/2022 Imaging   MRI Thoracic Spine w wo contrast 1. Interval resolution of the increased T2 signal contrast enhancement in the posterior elements of T7. No new lesion identified. 2. No spinal canal or neural foraminal stenosis.   08/23/2022 Echocardiogram   Stable low-normal LVEF at 50-55%.   10/05/2022 Imaging   PET restaging Stable examination without evidence of hypermetabolic recurrence or metastatic disease.   Persistent minimally metabolic stranding in the posterior bilateral gluteal soft tissues may reflect sequela of subcutaneous injections   02/24/2023 Echocardiogram   Echocardiogram showed stable low normal LVEF 50-55%.   04/07/2023 Imaging   PET scan  1. Status post bilateral mastectomies without evidence of hypermetabolic local recurrence. 2. No evidence of hypermetabolic metastatic disease. 3. Asymmetric right-sided tonsillar FDG avidity is  nonspecific consider further evaluation with direct visualization. 4. Gas fluid levels in the proximal colon may reflect a diarrheal illness or laxative use.   02/27/2024 Echocardiogram   LVEF 35-40%   06/04/2024 Imaging   PET scan showed No evidence of recurrent or metastatic disease.     +MRSA infection of lowe extremity, finished a course of doxycycline    INTERVAL HISTORY 43 yo female with above oncology history reviewed by me presents for follow-up of management of metastatic Triple positive breast cancer.  Chronic back pain, unchanged.  Neck/upper back pain for 1 week Intermittent diarrhea, manageable. Oral iron makes diarrhea worse, she  stopped.  + arthralgia + right lower back pain, intermittent.       Review of Systems  Constitutional:  Negative for chills, fever, malaise/fatigue and weight loss.  HENT:  Negative for sore throat.   Eyes:  Negative for redness.  Respiratory:  Negative for cough, shortness of breath and wheezing.   Cardiovascular:  Negative for chest pain, palpitations and leg swelling.  Gastrointestinal:  Negative for abdominal pain, blood in stool, heartburn, nausea and vomiting.  Genitourinary:  Negative for dysuria.  Musculoskeletal:  Positive for back pain and joint pain.  Skin:  Negative for rash.  Neurological:  Positive for tingling. Negative for dizziness and tremors.  Endo/Heme/Allergies:  Does not bruise/bleed easily.  Psychiatric/Behavioral:  Negative for hallucinations. The patient does not have insomnia.     No Known Allergies  Patient Active Problem List   Diagnosis Date Noted   Breast cancer of upper-outer quadrant of right female breast (HCC) 03/19/2018    Priority: High   Elevated serum creatinine 06/19/2024    Priority: Medium    Low back pain radiating to right lower extremity 12/22/2023    Priority: Medium    Normocytic anemia 06/23/2023    Priority: Medium    Hypokalemia 06/03/2022    Priority: Medium    Malignant neoplasm metastatic to bone (HCC) 03/25/2021    Priority: Medium    Encounter for monoclonal antibody treatment for malignancy 12/10/2020    Priority: Medium    Hypocalcemia 11/19/2020    Priority: Medium    Neuropathy due to chemotherapeutic drug (HCC) 08/11/2020    Priority: Medium    Rheumatoid arthritis (HCC) 10/13/2019    Priority: Medium    Nonischemic cardiomyopathy (HCC) 08/23/2018    Priority: Medium    Folliculitis and perifolliculitis 04/14/2023    Priority: Low   Abnormal bowel movement 04/14/2023    Priority: Low   Right shoulder pain 01/20/2023    Priority: Low   Thyroid  nodule 06/04/2022    Priority: Low   Encounter for  antineoplastic chemotherapy 10/29/2020    Priority: Low   Goals of care, counseling/discussion 08/11/2020    Priority: Low   Gastroesophageal reflux disease without esophagitis 02/24/2017    Priority: Low   Malignant tumor of breast (HCC) 07/11/2023   Stress fracture of metatarsal bone of right foot 07/11/2023   Change in bowel habits 06/23/2023   Closed fracture of fifth metatarsal bone 05/09/2023   Tachycardia, unspecified 04/15/2021   Bone lesion 11/19/2020   Inflammatory arthritis 11/19/2020   HER2-positive carcinoma of breast (HCC) 08/11/2020   Bilateral hand swelling 10/03/2019   Fracture of neck of metacarpal bone 05/14/2019   Chronic fatigue 04/09/2019   Polyarthralgia 04/09/2019   Status post right breast reconstruction 02/26/2019   Status post right mastectomy 02/26/2019   Mastalgia 02/15/2019   Shortness of breath 08/23/2018   Preprocedural cardiovascular examination 08/23/2018  Tachycardia 08/23/2018   Palpitations 08/23/2018   Estrogen receptor positive status (ER+) 04/04/2018   Acquired absence of right breast and nipple 04/03/2018   Family history of cancer    Malignant neoplasm of overlapping sites of right breast in female, estrogen receptor positive (HCC) 10/19/2017   Generalized anxiety disorder 10/03/2014   Headache 10/03/2014     Past Medical History:  Diagnosis Date   Anemia    Arthritis    BRCA negative 11/26/2017   Breast cancer (HCC) 10/11/2017   Multifocal, ER positive, PR negative, HER-2 negative. ypT3 ypN2a 8.7 cm, 4/15 nodes   Cardiomyopathy (HCC)    a. 10/2017 Echo: EF 60-65%, no rwma, Gr1 DD, nl RV size/fxn; b. 04/2018 Echo: EF 55-60%, no rwma, Nl RV size/fxn; c. 08/2018 Echo: EF 45%, diff HK, ? HK of antsept wall. Gr1 DD. Mild MR. Mild LAE/RAE. Mod dil RV.    Chronic bronchitis (HCC) 11/2017   COPD (chronic obstructive pulmonary disease) (HCC)    MILD PER CXR   Depression    Family history of cancer    GERD (gastroesophageal reflux  disease)    Headache    MIGRAINES   Heart murmur    ASYMPTOMATIC   Personal history of chemotherapy    current for right breast ca     Past Surgical History:  Procedure Laterality Date   AXILLARY LYMPH NODE DISSECTION Right 03/19/2018   Procedure: AXILLARY LYMPH NODE DISSECTION;  Surgeon: Dessa Reyes ORN, MD;  Location: [facility] ORS;  Service: General;  Laterality: Right;   BIOPSY  06/23/2023   Procedure: BIOPSY;  Surgeon: Therisa Bi, MD;  Location: [facility] ENDOSCOPY;  Service: Gastroenterology;;   BREAST BIOPSY Right 10/11/2017   12:30 posterior coil clip invasive mammary carcinoma   BREAST BIOPSY Right 10/11/2017   11:30 middle depth ribbon clip DCIS   BREAST BIOPSY Right 10/11/2017   5:30 anterior depth x shape invasive ductal carcinoma   BREAST IMPLANT REMOVAL Right 06/03/2019   Procedure: REMOVAL OF RIGHT BREAST IMPLANTS;  Surgeon: Lowery Estefana RAMAN, DO;  Location: [facility] ORS;  Service: Plastics;  Laterality: Right;   BREAST RECONSTRUCTION WITH PLACEMENT OF TISSUE EXPANDER AND FLEX HD (ACELLULAR HYDRATED DERMIS) Right 03/19/2018   Procedure: BREAST RECONSTRUCTION WITH PLACEMENT OF TISSUE EXPANDER AND FLEX HD (ACELLULAR HYDRATED DERMIS);  Surgeon: Lowery Estefana RAMAN, DO;  Location: [facility] ORS;  Service: Plastics;  Laterality: Right;   CARPAL TUNNEL RELEASE Bilateral 2020   CHOLECYSTECTOMY N/A 04/27/2020   Procedure: LAPAROSCOPIC CHOLECYSTECTOMY WITH INTRAOPERATIVE CHOLANGIOGRAM;  Surgeon: Dessa Reyes ORN, MD;  Location: [facility] ORS;  Service: General;  Laterality: N/A;   COLONOSCOPY WITH PROPOFOL  N/A 06/23/2023   Procedure: COLONOSCOPY WITH PROPOFOL ;  Surgeon: Therisa Bi, MD;  Location: [facility] ENDOSCOPY;  Service: Gastroenterology;  Laterality: N/A;   ESOPHAGOGASTRODUODENOSCOPY (EGD) WITH PROPOFOL  N/A 04/17/2020   Procedure: ESOPHAGOGASTRODUODENOSCOPY (EGD) WITH PROPOFOL ;  Surgeon: Dessa Reyes ORN, MD;  Location: [facility] ENDOSCOPY;  Service: Endoscopy;  Laterality: N/A;  with biopsy    ESOPHAGOGASTRODUODENOSCOPY (EGD) WITH PROPOFOL  N/A 06/23/2023   Procedure: ESOPHAGOGASTRODUODENOSCOPY (EGD) WITH PROPOFOL ;  Surgeon: Therisa Bi, MD;  Location: [facility] ENDOSCOPY;  Service: Gastroenterology;  Laterality: N/A;   LAPAROSCOPIC BILATERAL SALPINGO OOPHERECTOMY Bilateral 03/19/2018   Procedure: LAPAROSCOPIC BILATERAL SALPINGO OOPHORECTOMY;  Surgeon: Verdon Keen, MD;  Location: [facility] ORS;  Service: Gynecology;  Laterality: Bilateral;   MASTECTOMY Right 03/2018   MASTECTOMY W/ SENTINEL NODE BIOPSY Right 03/19/2018   Procedure: MASTECTOMY WITH SENTINEL LYMPH NODE BIOPSY;  Surgeon: Dessa Reyes ORN, MD;  Location: [facility]  ORS;  Service: General;  Laterality: Right;   PORT-A-CATH REMOVAL Left 06/03/2019   Procedure: REMOVAL PORT-A-CATH;  Surgeon: Dessa Reyes ORN, MD;  Location: [facility] ORS;  Service: General;  Laterality: Left;   PORTACATH PLACEMENT Left 10/24/2017   Procedure: INSERTION PORT-A-CATH;  Surgeon: Dessa Reyes ORN, MD;  Location: [facility] ORS;  Service: General;  Laterality: Left;   PORTACATH PLACEMENT Right 09/28/2020   Procedure: INSERTION PORT-A-CATH;  Surgeon: Dessa Reyes ORN, MD;  Location: [facility] ORS;  Service: General;  Laterality: Right;   REMOVAL OF TISSUE EXPANDER AND PLACEMENT OF IMPLANT Right 07/20/2018   Procedure: REMOVAL OF RIGHT BREAST TISSUE EXPANDER AND PLACEMENT OF IMPLANT;  Surgeon: Lowery Estefana RAMAN, DO;  Location: [facility];  Service: Plastics;  Laterality: Right;   SIMPLE MASTECTOMY WITH AXILLARY SENTINEL NODE BIOPSY Left 06/03/2019   Procedure: SIMPLE MASTECTOMY LEFT;  Surgeon: Dessa Reyes ORN, MD;  Location: [facility] ORS;  Service: General;  Laterality: Left;    Social History   Socioeconomic History   Marital status: Married    Spouse name: Not on file   Number of children: Not on file   Years of education: Not on file   Highest education level: Not on file  Occupational History   Occupation: pharmacy tech    Comment: Acupuncturist [employer]   Tobacco Use   Smoking status: Former    Current packs/day: 0.00    Average packs/day: 0.5 packs/day for 18.0 years (9.0 ttl pk-yrs)    Types: Cigarettes    Start date: 06/21/2018    Quit date: 12/11/2022    Years since quitting: 1.6   Smokeless tobacco: Never  Vaping Use   Vaping status: Never Used  Substance and Sexual Activity   Alcohol use: No   Drug use: No   Sexual activity: Yes    Birth control/protection: Injection, Other-see comments    Comment: has had hysterectomy  Other Topics Concern   Not on file  Social History Narrative   Lives at home with husband and daughter   Social Drivers of Corporate investment banker Strain: Not on file  Food Insecurity: No Food Insecurity (12/25/2023)   Hunger Vital Sign    Worried About Running Out of Food in the Last Year: Never true    Ran Out of Food in the Last Year: Never true  Transportation Needs: No Transportation Needs (12/25/2023)   PRAPARE - Administrator, Civil Service (Medical): No    Lack of Transportation (Non-Medical): No  Physical Activity: Not on file  Stress: Not on file  Social Connections: Not on file  Intimate Partner Violence: Not on file     Family History  Problem Relation Age of Onset   Diabetes Father    Hypertension Father    Hyperlipidemia Father    Heart attack Father 49       mild   Melanoma Maternal Aunt        other aunts with BCC/SCC/Melanoma   Cervical cancer Maternal Aunt 59       daughter w/ cervical cancer as well   Lung cancer Maternal Aunt    Melanoma Maternal Uncle        other uncles with BCC/SCC/Melanoma   Breast cancer Paternal Aunt    Bladder Cancer Maternal Grandmother   Biological mother had Grave's disease.    Current Outpatient Medications:    acetaminophen  (TYLENOL ) 500 MG tablet, Take 500 mg by mouth every 6 (six) hours as needed.,  Disp: , Rfl:    albuterol  (VENTOLIN  HFA) 108 (90 Base) MCG/ACT inhaler, Inhale 2 puffs into the  lungs every 6 (six) hours as needed for wheezing or shortness of breath. , Disp: , Rfl:    CALCIUM  CARBONATE-VITAMIN D  PO, Take 600-800 mg by mouth in the morning, at noon, and at bedtime., Disp: , Rfl:    chlorhexidine  (PERIDEX ) 0.12 % solution, Use as directed 15 mLs in the mouth or throat 2 (two) times daily., Disp: 120 mL, Rfl: 0   cyanocobalamin  (,VITAMIN B-12,) 1000 MCG/ML injection, Inject 1,000 mcg into the skin every 30 (thirty) days., Disp: , Rfl:    diphenoxylate -atropine  (LOMOTIL ) 2.5-0.025 MG tablet, Take 1 tablet by mouth 4 (four) times daily as needed for diarrhea or loose stools., Disp: 60 tablet, Rfl: 0   escitalopram  (LEXAPRO ) 20 MG tablet, Take 20 mg by mouth daily., Disp: , Rfl:    esomeprazole (NEXIUM) 40 MG capsule, Take 40 mg by mouth daily before breakfast. , Disp: , Rfl:    Eszopiclone 3 MG TABS, Take 3 mg by mouth at bedtime as needed (sleep). , Disp: , Rfl:    folic acid  (FOLVITE ) 1 MG tablet, Take 1 mg by mouth daily., Disp: , Rfl:    hydrocortisone 2.5 % cream, , Disp: , Rfl:    hydroxychloroquine (PLAQUENIL) 200 MG tablet, Take 200 mg by mouth daily., Disp: , Rfl:    hydrOXYzine  (ATARAX ) 10 MG tablet, Take 1 tablet (10 mg total) by mouth every 6 (six) hours as needed., Disp: 60 tablet, Rfl: 0   ibuprofen  (ADVIL ) 800 MG tablet, Take 800 mg by mouth every 8 (eight) hours as needed for moderate pain., Disp: , Rfl:    loperamide  (IMODIUM ) 2 MG capsule, Take 1 tablet (2 mg total) by mouth See admin instructions. Take 2 tablets with onset of diarrhea, then take 1 tablet every 2 hours until diarrhea stops. Maximum 8 tablets in 24, Disp: 90 capsule, Rfl: 0   loratadine  (CLARITIN ) 10 MG tablet, Take 10 mg by mouth daily. , Disp: , Rfl:    LORazepam (ATIVAN) 1 MG tablet, Take 1 mg by mouth 3 (three) times daily., Disp: , Rfl:    Magnesium 500 MG TABS, Take 500 mg by mouth 2 (two) times daily., Disp: , Rfl:    methocarbamol  (ROBAXIN ) 500 MG tablet, Take by mouth., Disp: , Rfl:     methotrexate (RHEUMATREX) 2.5 MG tablet, Take 25 mg by mouth every Sunday. 10 tablets once a week, Disp: , Rfl:    metoprolol  succinate (TOPROL  XL) 25 MG 24 hr tablet, Take 1 tablet (25 mg total) by mouth 2 (two) times daily. Monitor for lightheadedness and dizziness., Disp: 180 tablet, Rfl: 1   mupirocin  ointment (BACTROBAN ) 2 %, Place 1 application into the nose 2 (two) times daily. Use in each nostril twice daily for five (5) days., Disp: 22 g, Rfl: 5   nortriptyline (PAMELOR) 10 MG capsule, Take 30 mg by mouth at bedtime. , Disp: , Rfl:    oxyCODONE  (OXY IR/ROXICODONE ) 5 MG immediate release tablet, Take 1 tablet (5 mg total) by mouth every 6 (six) hours as needed for moderate pain (pain score 4-6) or severe pain (pain score 7-10)., Disp: 90 tablet, Rfl: 0   phentermine (ADIPEX-P) 37.5 MG tablet, Take 37.5 mg by mouth daily before breakfast. , Disp: , Rfl:    pregabalin  (LYRICA ) 150 MG capsule, Take 150 mg by mouth 2 (two) times daily., Disp: , Rfl:    promethazine  (  PHENERGAN ) 25 MG tablet, Take 1 tablet (25 mg total) by mouth every 8 (eight) hours as needed for nausea or vomiting., Disp: 90 tablet, Rfl: 0   pyridOXINE (VITAMIN B-6) 100 MG tablet, Take 100 mg by mouth daily., Disp: , Rfl:    topiramate (TOPAMAX) 50 MG tablet, Take 50 mg by mouth 2 (two) times daily. , Disp: , Rfl:    valsartan  (DIOVAN ) 40 MG tablet, Take 0.5 tablets (20 mg total) by mouth 2 (two) times daily., Disp: 90 tablet, Rfl: 3   vitamin C (ASCORBIC ACID) 500 MG tablet, Take 500 mg by mouth daily., Disp: , Rfl:  No current facility-administered medications for this visit.  Facility-Administered Medications Ordered in Other Visits:    heparin  lock flush 100 unit/mL, 500 Units, Intravenous, Once, Babara Call, MD   sodium chloride  flush (NS) 0.9 % injection 10 mL, 10 mL, Intravenous, PRN, Babara Call, MD, 10 mL at 11/19/18 1249   sodium chloride  flush (NS) 0.9 % injection 10 mL, 10 mL, Intravenous, Once, Babara Call, MD   Physical  exam:  Vitals:   07/31/24 0831  BP: 121/72  Pulse: 81  Resp: 18  Temp: (!) 96.1 F (35.6 C)  TempSrc: Tympanic  SpO2: 99%  Weight: 180 lb 12.8 oz (82 kg)  ECOG 1 Physical Exam Constitutional:      Appearance: Normal appearance.  HENT:     Head: Normocephalic and atraumatic.  Eyes:     General: No scleral icterus. Neck:     Vascular: No JVD.  Cardiovascular:     Rate and Rhythm: Normal rate.  Pulmonary:     Effort: Pulmonary effort is normal. No respiratory distress.     Breath sounds: Normal breath sounds.  Abdominal:     General: There is no distension.     Palpations: Abdomen is soft.  Musculoskeletal:     Cervical back: Normal range of motion.  Lymphadenopathy:     Cervical: No cervical adenopathy.  Skin:    General: Skin is warm.     Findings: No erythema or rash.  Neurological:     Mental Status: She is alert and oriented to person, place, and time. Mental status is at baseline.     Cranial Nerves: No cranial nerve deficit.     Motor: No abnormal muscle tone.  Psychiatric:        Mood and Affect: Mood and affect normal.        Labs     Latest Ref Rng & Units 07/31/2024    8:12 AM  CMP  Glucose 70 - 99 mg/dL 887   BUN 6 - 20 mg/dL 20   Creatinine 9.55 - 1.00 mg/dL 9.16   Sodium 864 - 854 mmol/L 138   Potassium 3.5 - 5.1 mmol/L 3.2   Chloride 98 - 111 mmol/L 109   CO2 22 - 32 mmol/L 21   Calcium  8.9 - 10.3 mg/dL 8.9   Total Protein 6.5 - 8.1 g/dL 7.1   Total Bilirubin 0.0 - 1.2 mg/dL 0.4   Alkaline Phos 38 - 126 U/L 72   AST 15 - 41 U/L 24   ALT 0 - 44 U/L 16       Latest Ref Rng & Units 07/31/2024    8:12 AM  CBC  WBC 4.0 - 10.5 K/uL 12.3   Hemoglobin 12.0 - 15.0 g/dL 9.9   Hematocrit 63.9 - 46.0 % 29.8   Platelets 150 - 400 K/uL 282    RADIOGRAPHIC STUDIES: I  have personally reviewed the radiological images as listed and agreed with the findings in the report. NM PET Image Restag (PS) Skull Base To Thigh Result Date:  06/09/2024 CLINICAL DATA:  Subsequent treatment strategy for breast cancer. EXAM: NUCLEAR MEDICINE PET SKULL BASE TO THIGH TECHNIQUE: 10.1 mCi F-18 FDG was injected intravenously. Full-ring PET imaging was performed from the skull base to thigh after the radiotracer. CT data was obtained and used for attenuation correction and anatomic localization. Fasting blood glucose: 105 mg/dl COMPARISON:  89/69/7975. FINDINGS: Mediastinal blood pool activity: SUV max 2.0 Liver activity: SUV max NA NECK: No abnormal hypermetabolism. Incidental CT findings: None. CHEST: No abnormal hypermetabolism. Incidental CT findings: Right IJ Port-A-Cath terminates in the right atrium. Heart is enlarged. No pericardial or pleural effusion. Post radiation scarring in the apex of the right upper lobe. ABDOMEN/PELVIS: No abnormal hypermetabolism. Incidental CT findings: Cholecystectomy. SKELETON: No abnormal hypermetabolism. Incidental CT findings: Degenerative changes in the spine.  Bilateral mastectomies. IMPRESSION: No evidence of recurrent or metastatic disease. Electronically Signed   By: Newell Eke M.D.   On: 06/09/2024 16:08   MR CARDIAC VELOCITY FLOW MAP Result Date: 05/22/2024 CLINICAL DATA:  Cardiomyopathy of uncertain etiology EXAM: CARDIAC MRI TECHNIQUE: The patient was scanned on a 1.5 Tesla GE magnet. A dedicated cardiac coil was used. Functional imaging was done using Fiesta sequences. 2,3, and 4 chamber views were done to assess for RWMA's. Modified Simpson's rule using a short axis stack was used to calculate an ejection fraction on a dedicated work [employer]. The patient received 8 cc of Gadavist . After 10 minutes inversion recovery sequences were used to assess for infiltration and scar tissue. FINDINGS: Limited images of the lung fields showed no gross abnormalities. Normal left ventricular size and wall thickness. Mild diffuse hypokinesis, LV EF 48%. Normal right ventricular size, RV EF 50%.  Normal left and right atrial sizes. Trileaflet aortic valve, no significant stenosis or regurgitation. Trivial mitral regurgitation, regurgitant fraction 8%. First pass perfusion appears normal. On delayed enhancement imaging, there was no myocardial late gadolinium enhancement (LGE). MEASUREMENTS: MEASUREMENTS LVEDV 161 mL LVEDVi 81 mL/m2 LVSV 78 mL LVEF 48% RVEDV 132 mL RVEDVi 66 mL/m2 RVSV 66 mL RVEF 50% Aortic forward volume 72 mL Aortic regurgitant fraction 2% Global T1 1085, ECV 29% Global T2 51 (normal) IMPRESSION: 1.  Normal LV size with mild diffuse hypokinesis, LV EF 48%. 2.  Normal RV size and systolic function, RV EF 50%. 3. No myocardial LGE, so no definitive evidence for prior MI, infiltrative disease, or myocarditis. 4.  Normal T1 and T2 indices. This appears to show a mild nonischemic cardiomyopathy. Dalton Mclean Electronically Signed   By: Ezra Shuck M.D.   On: 05/22/2024 13:57   MR CARDIAC VELOCITY FLOW MAP Result Date: 05/22/2024 CLINICAL DATA:  Cardiomyopathy of uncertain etiology EXAM: CARDIAC MRI TECHNIQUE: The patient was scanned on a 1.5 Tesla GE magnet. A dedicated cardiac coil was used. Functional imaging was done using Fiesta sequences. 2,3, and 4 chamber views were done to assess for RWMA's. Modified Simpson's rule using a short axis stack was used to calculate an ejection fraction on a dedicated work [employer]. The patient received 8 cc of Gadavist . After 10 minutes inversion recovery sequences were used to assess for infiltration and scar tissue. FINDINGS: Limited images of the lung fields showed no gross abnormalities. Normal left ventricular size and wall thickness. Mild diffuse hypokinesis, LV EF 48%. Normal right ventricular size, RV EF 50%.  Normal left and right atrial sizes. Trileaflet aortic valve, no significant stenosis or regurgitation. Trivial mitral regurgitation, regurgitant fraction 8%. First pass perfusion appears normal. On delayed enhancement  imaging, there was no myocardial late gadolinium enhancement (LGE). MEASUREMENTS: MEASUREMENTS LVEDV 161 mL LVEDVi 81 mL/m2 LVSV 78 mL LVEF 48% RVEDV 132 mL RVEDVi 66 mL/m2 RVSV 66 mL RVEF 50% Aortic forward volume 72 mL Aortic regurgitant fraction 2% Global T1 1085, ECV 29% Global T2 51 (normal) IMPRESSION: 1.  Normal LV size with mild diffuse hypokinesis, LV EF 48%. 2.  Normal RV size and systolic function, RV EF 50%. 3. No myocardial LGE, so no definitive evidence for prior MI, infiltrative disease, or myocarditis. 4.  Normal T1 and T2 indices. This appears to show a mild nonischemic cardiomyopathy. Dalton Mclean Electronically Signed   By: Ezra Shuck M.D.   On: 05/22/2024 13:57   MR CARDIAC MORPHOLOGY W WO CONTRAST Result Date: 05/22/2024 CLINICAL DATA:  Cardiomyopathy of uncertain etiology EXAM: CARDIAC MRI TECHNIQUE: The patient was scanned on a 1.5 Tesla GE magnet. A dedicated cardiac coil was used. Functional imaging was done using Fiesta sequences. 2,3, and 4 chamber views were done to assess for RWMA's. Modified Simpson's rule using a short axis stack was used to calculate an ejection fraction on a dedicated work [employer]. The patient received 8 cc of Gadavist . After 10 minutes inversion recovery sequences were used to assess for infiltration and scar tissue. FINDINGS: Limited images of the lung fields showed no gross abnormalities. Normal left ventricular size and wall thickness. Mild diffuse hypokinesis, LV EF 48%. Normal right ventricular size, RV EF 50%. Normal left and right atrial sizes. Trileaflet aortic valve, no significant stenosis or regurgitation. Trivial mitral regurgitation, regurgitant fraction 8%. First pass perfusion appears normal. On delayed enhancement imaging, there was no myocardial late gadolinium enhancement (LGE). MEASUREMENTS: MEASUREMENTS LVEDV 161 mL LVEDVi 81 mL/m2 LVSV 78 mL LVEF 48% RVEDV 132 mL RVEDVi 66 mL/m2 RVSV 66 mL RVEF 50% Aortic forward  volume 72 mL Aortic regurgitant fraction 2% Global T1 1085, ECV 29% Global T2 51 (normal) IMPRESSION: 1.  Normal LV size with mild diffuse hypokinesis, LV EF 48%. 2.  Normal RV size and systolic function, RV EF 50%. 3. No myocardial LGE, so no definitive evidence for prior MI, infiltrative disease, or myocarditis. 4.  Normal T1 and T2 indices. This appears to show a mild nonischemic cardiomyopathy. Dalton Mclean Electronically Signed   By: Ezra Shuck M.D.   On: 05/22/2024 13:57   ECHOCARDIOGRAM COMPLETE Result Date: 05/14/2024    ECHOCARDIOGRAM REPORT   Patient Name:   Theresa Chaney Date of Exam: 05/14/2024 Medical Rec #:  [id number]      Height:       67.0 in Accession #:    [id number]     Weight:       183.6 lb Date of Birth:  [birth year]      BSA:          1.950 m Patient Age:    43 years       BP:           103/59 mmHg Patient Gender: F              HR:           96 bpm. Exam Location:  [facility] Procedure: 2D Echo, Cardiac Doppler, Color Doppler, Strain Analysis and 3D Echo            (Both  Spectral and Color Flow Doppler were utilized during            procedure). Indications:     Cardiomyopathy, unspecified I42.9                  Nonischemic cardiomyopathy I42.8  History:         Patient has prior history of Echocardiogram examinations, most                  recent 02/27/2024. Cardiomyopathy; COPD. Masectomy.  Sonographer:     Christopher Furnace Referring Phys:  [id number] EZRA GORMAN SHUCK Diagnosing Phys: Lonni Hanson MD  Sonographer Comments: Global longitudinal strain was attempted. IMPRESSIONS  1. Left ventricular ejection fraction, by estimation, is 50 to 55%. Left ventricular ejection fraction by 3D volume is 54 %. The left ventricle has low normal function. The left ventricle has no regional wall motion abnormalities. Left ventricular diastolic parameters were normal. The average left ventricular global longitudinal strain is -14.7 %. The global longitudinal strain is abnormal.  2. Right ventricular systolic function  is normal. The right ventricular size is normal. Tricuspid regurgitation signal is inadequate for assessing PA pressure.  3. The mitral valve is normal in structure. Mild mitral valve regurgitation. No evidence of mitral stenosis.  4. The aortic valve is tricuspid. Aortic valve regurgitation is not visualized. No aortic stenosis is present.  5. The inferior vena cava is normal in size with greater than 50% respiratory variability, suggesting right atrial pressure of 3 mmHg. FINDINGS  Left Ventricle: Left ventricular ejection fraction, by estimation, is 50 to 55%. Left ventricular ejection fraction by 3D volume is 54 %. The left ventricle has low normal function. The left ventricle has no regional wall motion abnormalities. The average left ventricular global longitudinal strain is -14.7 %. Strain was performed and the global longitudinal strain is abnormal. The left ventricular internal cavity size was normal in size. There is no left ventricular hypertrophy. Left ventricular diastolic parameters were normal. Right Ventricle: The right ventricular size is normal. No increase in right ventricular wall thickness. Right ventricular systolic function is normal. Tricuspid regurgitation signal is inadequate for assessing PA pressure. Left Atrium: Left atrial size was normal in size. Right Atrium: Right atrial size was normal in size. Pericardium: There is no evidence of pericardial effusion. Mitral Valve: The mitral valve is normal in structure. Mild mitral valve regurgitation. No evidence of mitral valve stenosis. MV peak gradient, 3.0 mmHg. The mean mitral valve gradient is 2.0 mmHg. Tricuspid Valve: The tricuspid valve is normal in structure. Tricuspid valve regurgitation is mild. Aortic Valve: The aortic valve is tricuspid. Aortic valve regurgitation is not visualized. No aortic stenosis is present. Aortic valve mean gradient measures 3.0 mmHg. Aortic valve peak gradient measures 5.5 mmHg. Aortic valve area, by VTI  measures 2.56 cm. Pulmonic Valve: The pulmonic valve was normal in structure. Pulmonic valve regurgitation is trivial. No evidence of pulmonic stenosis. Aorta: The aortic root is normal in size and structure. Pulmonary Artery: The pulmonary artery is of normal size. Venous: The inferior vena cava is normal in size with greater than 50% respiratory variability, suggesting right atrial pressure of 3 mmHg. IAS/Shunts: No atrial level shunt detected by color flow Doppler. Additional Comments: 3D was performed not requiring image post processing on an independent workstation and was normal. A venous catheter is visualized in the right atrium.  LEFT VENTRICLE PLAX 2D LVIDd:         4.70 cm  Diastology LVIDs:         3.40 cm         LV e' medial:    11.10 cm/s LV PW:         0.87 cm         LV E/e' medial:  7.2 LV IVS:        0.66 cm         LV e' lateral:   14.50 cm/s LVOT diam:     2.20 cm         LV E/e' lateral: 5.5 LV SV:         61 LV SV Index:   31              2D Longitudinal LVOT Area:     3.80 cm        Strain                                2D Strain GLS   -14.7 %                                Avg:                                 3D Volume EF                                LV 3D EF:    Left                                             ventricul                                             ar                                             ejection                                             fraction                                             by 3D                                             volume is  54 %.                                 3D Volume EF:                                3D EF:        54 % RIGHT VENTRICLE RV Basal diam:  2.70 cm RV Mid diam:    2.40 cm LEFT ATRIUM             Index        RIGHT ATRIUM          Index LA diam:        2.60 cm 1.33 cm/m   RA Area:     8.66 cm LA Vol (A2C):   27.6 ml 14.15 ml/m  RA Volume:   18.00 ml 9.23 ml/m LA Vol  (A4C):   27.4 ml 14.05 ml/m LA Biplane Vol: 27.4 ml 14.05 ml/m  AORTIC VALVE AV Area (Vmax):    2.28 cm AV Area (Vmean):   2.34 cm AV Area (VTI):     2.56 cm AV Vmax:           117.00 cm/s AV Vmean:          86.600 cm/s AV VTI:            0.239 m AV Peak Grad:      5.5 mmHg AV Mean Grad:      3.0 mmHg LVOT Vmax:         70.30 cm/s LVOT Vmean:        53.200 cm/s LVOT VTI:          0.161 m LVOT/AV VTI ratio: 0.67  AORTA Ao Root diam: 2.60 cm MITRAL VALVE MV Area (PHT): 3.93 cm    SHUNTS MV Area VTI:   3.09 cm    Systemic VTI:  0.16 m MV Peak grad:  3.0 mmHg    Systemic Diam: 2.20 cm MV Mean grad:  2.0 mmHg MV Vmax:       0.86 m/s MV Vmean:      61.6 cm/s MV Decel Time: 193 msec MV E velocity: 79.70 cm/s MV A velocity: 62.90 cm/s MV E/A ratio:  1.27 Lonni Hanson MD Electronically signed by Lonni Hanson MD Signature Date/Time: 05/14/2024/5:25:57 PM    Final

## 2024-07-31 NOTE — Assessment & Plan Note (Signed)
Antibody treatment plan as list above. 

## 2024-07-31 NOTE — Assessment & Plan Note (Signed)
She does not tolerate oral potassium.  Potassium is stable. 

## 2024-07-31 NOTE — Patient Instructions (Signed)
 [facility]  Discharge Instructions: Thank you for choosing [facility] to provide your oncology and hematology care.  If you have a lab appointment with the Cancer Center, please go directly to the Cancer Center and check in at the registration area.  Wear comfortable clothing and clothing appropriate for easy access to any Portacath or PICC line.   We strive to give you quality time with your provider. You may need to reschedule your appointment if you arrive late (15 or more minutes).  Arriving late affects you and other patients whose appointments are after yours.  Also, if you miss three or more appointments without notifying the office, you may be dismissed from the clinic at the provider's discretion.      For prescription refill requests, have your pharmacy contact our office and allow 72 hours for refills to be completed.    Today you received the following chemotherapy and/or immunotherapy agents KANJINTI and PERJETA       To help prevent nausea and vomiting after your treatment, we encourage you to take your nausea medication as directed.  BELOW ARE SYMPTOMS THAT SHOULD BE REPORTED IMMEDIATELY: *FEVER GREATER THAN 100.4 F (38 C) OR HIGHER *CHILLS OR SWEATING *NAUSEA AND VOMITING THAT IS NOT CONTROLLED WITH YOUR NAUSEA MEDICATION *UNUSUAL SHORTNESS OF BREATH *UNUSUAL BRUISING OR BLEEDING *URINARY PROBLEMS (pain or burning when urinating, or frequent urination) *BOWEL PROBLEMS (unusual diarrhea, constipation, pain near the anus) TENDERNESS IN MOUTH AND THROAT WITH OR WITHOUT PRESENCE OF ULCERS (sore throat, sores in mouth, or a toothache) UNUSUAL RASH, SWELLING OR PAIN  UNUSUAL VAGINAL DISCHARGE OR ITCHING   Items with * indicate a potential emergency and should be followed up as soon as possible or go to the Emergency Department if any problems should occur.  Please show the CHEMOTHERAPY ALERT CARD or  IMMUNOTHERAPY ALERT CARD at check-in to the Emergency Department and triage nurse.  Should you have questions after your visit or need to cancel or reschedule your appointment, please contact [facility] - A DEPT OF [facility]  [phone] and follow the prompts.  Office hours are 8:00 a.m. to 4:30 p.m. Monday - Friday. Please note that voicemails left after 4:00 p.m. may not be returned until the following business day.  We are closed weekends and major holidays. You have access to a nurse at all times for urgent questions. Please call the main number to the clinic [phone] and follow the prompts.  For any non-urgent questions, you may also contact your provider using MyChart. We now offer e-Visits for anyone 81 and older to request care online for non-urgent symptoms. For details visit [email or web address].   Also download the MyChart app! Go to the app store, search "MyChart", open the app, select [facility], and log in with your MyChart username and password.   Trastuzumab Injection What is this medication? TRASTUZUMAB (tras TOO zoo mab) treats breast cancer and stomach cancer. It works by blocking a protein that causes cancer cells to grow and multiply. This helps to slow or stop the spread of cancer cells. This medicine may be used for other purposes; ask your health care provider or pharmacist if you have questions. COMMON BRAND NAME(S): Herceptin, HERCESSI, Herzuma, KANJINTI, Ogivri, Ontruzant, Trazimera What should I tell my care team before I take this medication? They need to know if you have any of these conditions:  Heart failure Lung disease An unusual or allergic reaction to trastuzumab, other medications, foods, dyes, or preservatives Pregnant or trying to get pregnant Breast-feeding How should I use this medication? This medication is injected into a vein. It is given by your care team in a hospital or clinic setting. Talk to your  care team about the use of this medication in children. It is not approved for use in children. Overdosage: If you think you have taken too much of this medicine contact a poison control center or emergency room at once. NOTE: This medicine is only for you. Do not share this medicine with others. What if I miss a dose? Keep appointments for follow-up doses. It is important not to miss your dose. Call your care team if you are unable to keep an appointment. What may interact with this medication? Certain types of chemotherapy, such as daunorubicin, doxorubicin, epirubicin, idarubicin This list may not describe all possible interactions. Give your health care provider a list of all the medicines, herbs, non-prescription drugs, or dietary supplements you use. Also tell them if you smoke, drink alcohol, or use illegal drugs. Some items may interact with your medicine. What should I watch for while using this medication? Your condition will be monitored carefully while you are receiving this medication. This medication may make you feel generally unwell. This is not uncommon, as chemotherapy affects healthy cells as well as cancer cells. Report any side effects. Continue your course of treatment even though you feel ill unless your care team tells you to stop. This medication may increase your risk of getting an infection. Call your care team for advice if you get a fever, chills, sore throat, or other symptoms of a cold or flu. Do not treat yourself. Try to avoid being around people who are sick. Avoid taking medications that contain aspirin, acetaminophen, ibuprofen, naproxen, or ketoprofen unless instructed by your care team. These medications can hide a fever. Talk to your care team if you may be pregnant. Serious birth defects can occur if you take this medication during pregnancy and for 7 months after the last dose. You will need a negative pregnancy test before starting this medication. Contraception  is recommended while taking this medication and for 7 months after the last dose. Your care team can help you find the option that works for you. Do not breastfeed while taking this medication and for 7 months after stopping treatment. What side effects may I notice from receiving this medication? Side effects that you should report to your care team as soon as possible: Allergic reactions or angioedema--skin rash, itching or hives, swelling of the face, eyes, lips, tongue, arms, or legs, trouble swallowing or breathing Dry cough, shortness of breath or trouble breathing Heart failure--shortness of breath, swelling of the ankles, feet, or hands, sudden weight gain, unusual weakness or fatigue Infection--fever, chills, cough, or sore throat Infusion reactions--chest pain, shortness of breath or trouble breathing, feeling faint or lightheaded Side effects that usually do not require medical attention (report to your care team if they continue or are bothersome): Diarrhea Dizziness Headache Nausea Trouble sleeping Vomiting This list may not describe all possible side effects. Call your doctor for medical advice about side effects. You may report side effects to FDA at 1-800-FDA-1088. Where should I keep my medication? This medication is given in a hospital or clinic. It will not be stored at home. NOTE: This sheet is a summary. It may not cover all possible information. If you  have questions about this medicine, talk to your doctor, pharmacist, or health care provider.  2024 Elsevier/Gold Standard (2022-04-12 00:00:00)  Pertuzumab Injection What is this medication? PERTUZUMAB (per TOOZ ue mab) treats breast cancer. It works by blocking a protein that causes cancer cells to grow and multiply. This helps to slow or stop the spread of cancer cells. It is a monoclonal antibody. This medicine may be used for other purposes; ask your health care provider or pharmacist if you have questions. COMMON  BRAND NAME(S): PERJETA What should I tell my care team before I take this medication? They need to know if you have any of these conditions: Heart failure An unusual or allergic reaction to pertuzumab, other medications, foods, dyes, or preservatives Pregnant or trying to get pregnant Breast-feeding How should I use this medication? This medication is injected into a vein. It is given by your care team in a hospital or clinic setting. Talk to your care team about the use of this medication in children. Special care may be needed. Overdosage: If you think you have taken too much of this medicine contact a poison control center or emergency room at once. NOTE: This medicine is only for you. Do not share this medicine with others. What if I miss a dose? Keep appointments for follow-up doses. It is important not to miss your dose. Call your care team if you are unable to keep an appointment. What may interact with this medication? Interactions are not expected. This list may not describe all possible interactions. Give your health care provider a list of all the medicines, herbs, non-prescription drugs, or dietary supplements you use. Also tell them if you smoke, drink alcohol, or use illegal drugs. Some items may interact with your medicine. What should I watch for while using this medication? Your condition will be monitored carefully while you are receiving this medication. This medication may make you feel generally unwell. This is not uncommon as chemotherapy can affect healthy cells as well as cancer cells. Report any side effects. Continue your course of treatment even though you feel ill unless your care team tells you to stop. Talk to your care team if you may be pregnant. Serious birth defects can occur if you take this medication during pregnancy and for 7 months after the last dose. You will need a negative pregnancy test before starting this medication. Contraception is recommended while  taking this medication and for 7 months after the last dose. Your care team can help you find the option that works for you. Do not breastfeed while taking this medication and for 7 months after the last dose. What side effects may I notice from receiving this medication? Side effects that you should report to your care team as soon as possible: Allergic reactions or angioedema--skin rash, itching or hives, swelling of the face, eyes, lips, tongue, arms, or legs, trouble swallowing or breathing Heart failure--shortness of breath, swelling of the ankles, feet, or hands, sudden weight gain, unusual weakness or fatigue Infusion reactions--chest pain, shortness of breath or trouble breathing, feeling faint or lightheaded Side effects that usually do not require medical attention (report to your care team if they continue or are bothersome): Diarrhea Dry skin Fatigue Hair loss Nausea Vomiting This list may not describe all possible side effects. Call your doctor for medical advice about side effects. You may report side effects to FDA at 1-800-FDA-1088. Where should I keep my medication? This medication is given in a hospital or clinic. It  will not be stored at home. NOTE: This sheet is a summary. It may not cover all possible information. If you have questions about this medicine, talk to your doctor, pharmacist, or health care provider.  2024 Elsevier/Gold Standard (2022-04-12 00:00:00)

## 2024-08-14 ENCOUNTER — Inpatient Hospital Stay: Attending: Oncology

## 2024-08-14 DIAGNOSIS — Z79899 Other long term (current) drug therapy: Secondary | ICD-10-CM | POA: Diagnosis not present

## 2024-08-14 DIAGNOSIS — Z5112 Encounter for antineoplastic immunotherapy: Secondary | ICD-10-CM | POA: Diagnosis present

## 2024-08-14 DIAGNOSIS — Z79620 Long term (current) use of immunosuppressive biologic: Secondary | ICD-10-CM | POA: Diagnosis not present

## 2024-08-14 DIAGNOSIS — C50411 Malignant neoplasm of upper-outer quadrant of right female breast: Secondary | ICD-10-CM | POA: Insufficient documentation

## 2024-08-14 DIAGNOSIS — C7951 Secondary malignant neoplasm of bone: Secondary | ICD-10-CM | POA: Diagnosis present

## 2024-08-14 MED ORDER — FULVESTRANT 250 MG/5ML IM SOSY
500.0000 mg | PREFILLED_SYRINGE | INTRAMUSCULAR | Status: DC
  Administered 2024-08-14: 500 mg via INTRAMUSCULAR
  Filled 2024-08-14: qty 10

## 2024-08-21 ENCOUNTER — Inpatient Hospital Stay

## 2024-08-21 VITALS — BP 107/51 | HR 79 | Temp 97.2°F | Resp 17 | Wt 176.8 lb

## 2024-08-21 DIAGNOSIS — Z5112 Encounter for antineoplastic immunotherapy: Secondary | ICD-10-CM | POA: Diagnosis not present

## 2024-08-21 DIAGNOSIS — C50811 Malignant neoplasm of overlapping sites of right female breast: Secondary | ICD-10-CM

## 2024-08-21 LAB — CBC WITH DIFFERENTIAL/PLATELET
Abs Immature Granulocytes: 0.03 K/uL (ref 0.00–0.07)
Basophils Absolute: 0 K/uL (ref 0.0–0.1)
Basophils Relative: 1 %
Eosinophils Absolute: 0.2 K/uL (ref 0.0–0.5)
Eosinophils Relative: 3 %
HCT: 30.8 % — ABNORMAL LOW (ref 36.0–46.0)
Hemoglobin: 10.3 g/dL — ABNORMAL LOW (ref 12.0–15.0)
Immature Granulocytes: 0 %
Lymphocytes Relative: 24 %
Lymphs Abs: 1.9 K/uL (ref 0.7–4.0)
MCH: 29.9 pg (ref 26.0–34.0)
MCHC: 33.4 g/dL (ref 30.0–36.0)
MCV: 89.3 fL (ref 80.0–100.0)
Monocytes Absolute: 0.5 K/uL (ref 0.1–1.0)
Monocytes Relative: 7 %
Neutro Abs: 5.1 K/uL (ref 1.7–7.7)
Neutrophils Relative %: 65 %
Platelets: 286 K/uL (ref 150–400)
RBC: 3.45 MIL/uL — ABNORMAL LOW (ref 3.87–5.11)
RDW: 13.8 % (ref 11.5–15.5)
WBC: 7.7 K/uL (ref 4.0–10.5)
nRBC: 0 % (ref 0.0–0.2)

## 2024-08-21 LAB — COMPREHENSIVE METABOLIC PANEL WITH GFR
ALT: 15 U/L (ref 0–44)
AST: 25 U/L (ref 15–41)
Albumin: 4.1 g/dL (ref 3.5–5.0)
Alkaline Phosphatase: 69 U/L (ref 38–126)
Anion gap: 9 (ref 5–15)
BUN: 23 mg/dL — ABNORMAL HIGH (ref 6–20)
CO2: 20 mmol/L — ABNORMAL LOW (ref 22–32)
Calcium: 8.6 mg/dL — ABNORMAL LOW (ref 8.9–10.3)
Chloride: 109 mmol/L (ref 98–111)
Creatinine, Ser: 0.89 mg/dL (ref 0.44–1.00)
GFR, Estimated: >60 mL/min (ref >60)
Glucose, Bld: 110 mg/dL — ABNORMAL HIGH (ref 70–99)
Potassium: 3.4 mmol/L — ABNORMAL LOW (ref 3.5–5.1)
Sodium: 138 mmol/L (ref 135–145)
Total Bilirubin: 0.4 mg/dL (ref 0.0–1.2)
Total Protein: 6.9 g/dL (ref 6.5–8.1)

## 2024-08-21 MED ORDER — TRASTUZUMAB-ANNS CHEMO 150 MG IV SOLR
6.0000 mg/kg | Freq: Once | INTRAVENOUS | Status: AC
  Administered 2024-08-21: 462 mg via INTRAVENOUS
  Filled 2024-08-21: qty 22

## 2024-08-21 MED ORDER — SODIUM CHLORIDE 0.9 % IV SOLN
Freq: Once | INTRAVENOUS | Status: AC
  Filled 2024-08-21: qty 250

## 2024-08-21 MED ORDER — SODIUM CHLORIDE 0.9 % IV SOLN
420.0000 mg | Freq: Once | INTRAVENOUS | Status: AC
  Administered 2024-08-21: 420 mg via INTRAVENOUS
  Filled 2024-08-21: qty 14

## 2024-09-11 ENCOUNTER — Inpatient Hospital Stay: Attending: Oncology

## 2024-09-11 ENCOUNTER — Inpatient Hospital Stay (HOSPITAL_BASED_OUTPATIENT_CLINIC_OR_DEPARTMENT_OTHER): Admitting: Oncology

## 2024-09-11 ENCOUNTER — Encounter: Payer: Self-pay | Admitting: Oncology

## 2024-09-11 ENCOUNTER — Inpatient Hospital Stay

## 2024-09-11 VITALS — BP 111/60 | HR 88 | Temp 97.4°F | Resp 16 | Wt 174.0 lb

## 2024-09-11 VITALS — BP 100/60 | HR 85

## 2024-09-11 DIAGNOSIS — Z79899 Other long term (current) drug therapy: Secondary | ICD-10-CM | POA: Diagnosis not present

## 2024-09-11 DIAGNOSIS — C50811 Malignant neoplasm of overlapping sites of right female breast: Secondary | ICD-10-CM

## 2024-09-11 DIAGNOSIS — M069 Rheumatoid arthritis, unspecified: Secondary | ICD-10-CM

## 2024-09-11 DIAGNOSIS — Z5111 Encounter for antineoplastic chemotherapy: Secondary | ICD-10-CM | POA: Insufficient documentation

## 2024-09-11 DIAGNOSIS — C7951 Secondary malignant neoplasm of bone: Secondary | ICD-10-CM

## 2024-09-11 DIAGNOSIS — Z452 Encounter for adjustment and management of vascular access device: Secondary | ICD-10-CM | POA: Diagnosis not present

## 2024-09-11 DIAGNOSIS — Z79620 Long term (current) use of immunosuppressive biologic: Secondary | ICD-10-CM | POA: Diagnosis not present

## 2024-09-11 DIAGNOSIS — G620 Drug-induced polyneuropathy: Secondary | ICD-10-CM | POA: Diagnosis not present

## 2024-09-11 DIAGNOSIS — Z95828 Presence of other vascular implants and grafts: Secondary | ICD-10-CM | POA: Insufficient documentation

## 2024-09-11 DIAGNOSIS — Z170 Estrogen receptor positive status [ER+]: Secondary | ICD-10-CM

## 2024-09-11 DIAGNOSIS — Z5112 Encounter for antineoplastic immunotherapy: Secondary | ICD-10-CM | POA: Diagnosis present

## 2024-09-11 DIAGNOSIS — T451X5A Adverse effect of antineoplastic and immunosuppressive drugs, initial encounter: Secondary | ICD-10-CM

## 2024-09-11 DIAGNOSIS — C50411 Malignant neoplasm of upper-outer quadrant of right female breast: Secondary | ICD-10-CM

## 2024-09-11 DIAGNOSIS — D649 Anemia, unspecified: Secondary | ICD-10-CM

## 2024-09-11 DIAGNOSIS — I428 Other cardiomyopathies: Secondary | ICD-10-CM

## 2024-09-11 LAB — COMPREHENSIVE METABOLIC PANEL WITH GFR
ALT: 18 U/L (ref 0–44)
AST: 27 U/L (ref 15–41)
Albumin: 3.8 g/dL (ref 3.5–5.0)
Alkaline Phosphatase: 70 U/L (ref 38–126)
Anion gap: 6 (ref 5–15)
BUN: 18 mg/dL (ref 6–20)
CO2: 23 mmol/L (ref 22–32)
Calcium: 8.9 mg/dL (ref 8.9–10.3)
Chloride: 110 mmol/L (ref 98–111)
Creatinine, Ser: 0.98 mg/dL (ref 0.44–1.00)
GFR, Estimated: >60 mL/min (ref >60)
Glucose, Bld: 111 mg/dL — ABNORMAL HIGH (ref 70–99)
Potassium: 4.2 mmol/L (ref 3.5–5.1)
Sodium: 139 mmol/L (ref 135–145)
Total Bilirubin: 0.4 mg/dL (ref 0.0–1.2)
Total Protein: 6.8 g/dL (ref 6.5–8.1)

## 2024-09-11 LAB — CBC WITH DIFFERENTIAL/PLATELET
Abs Immature Granulocytes: 0.02 K/uL (ref 0.00–0.07)
Basophils Absolute: 0 K/uL (ref 0.0–0.1)
Basophils Relative: 1 %
Eosinophils Absolute: 0.2 K/uL (ref 0.0–0.5)
Eosinophils Relative: 2 %
HCT: 31.6 % — ABNORMAL LOW (ref 36.0–46.0)
Hemoglobin: 10.6 g/dL — ABNORMAL LOW (ref 12.0–15.0)
Immature Granulocytes: 0 %
Lymphocytes Relative: 28 %
Lymphs Abs: 2 K/uL (ref 0.7–4.0)
MCH: 30.3 pg (ref 26.0–34.0)
MCHC: 33.5 g/dL (ref 30.0–36.0)
MCV: 90.3 fL (ref 80.0–100.0)
Monocytes Absolute: 0.6 K/uL (ref 0.1–1.0)
Monocytes Relative: 9 %
Neutro Abs: 4.2 K/uL (ref 1.7–7.7)
Neutrophils Relative %: 60 %
Platelets: 248 K/uL (ref 150–400)
RBC: 3.5 MIL/uL — ABNORMAL LOW (ref 3.87–5.11)
RDW: 14.1 % (ref 11.5–15.5)
WBC: 7 K/uL (ref 4.0–10.5)
nRBC: 0 % (ref 0.0–0.2)

## 2024-09-11 MED ORDER — ALTEPLASE 2 MG IJ SOLR
2.0000 mg | Freq: Once | INTRAMUSCULAR | Status: AC | PRN
  Administered 2024-09-11: 2 mg
  Filled 2024-09-11: qty 2

## 2024-09-11 MED ORDER — SODIUM CHLORIDE 0.9 % IV SOLN
Freq: Once | INTRAVENOUS | Status: AC
  Filled 2024-09-11: qty 250

## 2024-09-11 MED ORDER — FULVESTRANT 250 MG/5ML IM SOSY
500.0000 mg | PREFILLED_SYRINGE | INTRAMUSCULAR | Status: DC
  Administered 2024-09-11: 500 mg via INTRAMUSCULAR
  Filled 2024-09-11: qty 10

## 2024-09-11 MED ORDER — SODIUM CHLORIDE 0.9 % IV SOLN
420.0000 mg | Freq: Once | INTRAVENOUS | Status: AC
  Administered 2024-09-11: 420 mg via INTRAVENOUS
  Filled 2024-09-11: qty 14

## 2024-09-11 MED ORDER — TRASTUZUMAB-ANNS CHEMO 150 MG IV SOLR
6.0000 mg/kg | Freq: Once | INTRAVENOUS | Status: AC
  Administered 2024-09-11: 462 mg via INTRAVENOUS
  Filled 2024-09-11: qty 22

## 2024-09-11 NOTE — Progress Notes (Signed)
 No blood return after 2 hours of cathflo. Needle removed, and replaced with new needle. No blood return with new needle. Flushed and deaccessed. MD notified. Will be scheduled for dye study.

## 2024-09-11 NOTE — Assessment & Plan Note (Signed)
 Normal iron tibc ferritin, normal  B12 and folate.  Monitor. - she can not tolerate oral iron supplementation due to diarrhea Anemia could be due to methotrexate, anemia of chronic disease.

## 2024-09-11 NOTE — Assessment & Plan Note (Signed)
Follow up with  rheumatology.  Patient is on methotrexate,  plaquenil.  

## 2024-09-11 NOTE — Assessment & Plan Note (Signed)
 No blood return, despite cath flow incubation.  Recommend dye study

## 2024-09-11 NOTE — Assessment & Plan Note (Addendum)
 Recurrent breast cancer with isolated bone metastasis, stage IV #Initially stage IIIA right breast cancer, ER positive, HER-2 negative status post bilateral oophorectomy,Right mastectomy and right axillary lymph node dissection, status post implant, and implant removal.  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 ER positive, HER-2 positive  On maintenance Kanjinti  Perjecta .- s/p Palliative radiation to T7  10/20/23 PET NED,10/17/23 normal bone density on DEXA 06/04/2024 PET NED  Labs reviewed and discussed Proceed with Kanjinti  Perjecta Continue fulvestrant  monthly. Discussed about the possibility of future discontinuation of treatments, especially if she experiences more cardiology toxicities.

## 2024-09-11 NOTE — Patient Instructions (Signed)
 [facility] - A DEPT OF [facility]  Discharge Instructions: Thank you for choosing [facility] to provide your oncology and hematology care.  If you have a lab appointment with the Cancer Center, please go directly to the Cancer Center and check in at the registration area.  Wear comfortable clothing and clothing appropriate for easy access to any Portacath or PICC line.   We strive to give you quality time with your provider. You may need to reschedule your appointment if you arrive late (15 or more minutes).  Arriving late affects you and other patients whose appointments are after yours.  Also, if you miss three or more appointments without notifying the office, you may be dismissed from the clinic at the provider's discretion.      For prescription refill requests, have your pharmacy contact our office and allow 72 hours for refills to be completed.    Today you received the following chemotherapy and/or immunotherapy agents- trastuzumab , pertuzumab , fulvestrant       To help prevent nausea and vomiting after your treatment, we encourage you to take your nausea medication as directed.  BELOW ARE SYMPTOMS THAT SHOULD BE REPORTED IMMEDIATELY: *FEVER GREATER THAN 100.4 F (38 C) OR HIGHER *CHILLS OR SWEATING *NAUSEA AND VOMITING THAT IS NOT CONTROLLED WITH YOUR NAUSEA MEDICATION *UNUSUAL SHORTNESS OF BREATH *UNUSUAL BRUISING OR BLEEDING *URINARY PROBLEMS (pain or burning when urinating, or frequent urination) *BOWEL PROBLEMS (unusual diarrhea, constipation, pain near the anus) TENDERNESS IN MOUTH AND THROAT WITH OR WITHOUT PRESENCE OF ULCERS (sore throat, sores in mouth, or a toothache) UNUSUAL RASH, SWELLING OR PAIN  UNUSUAL VAGINAL DISCHARGE OR ITCHING   Items with * indicate a potential emergency and should be followed up as soon as possible or go to the Emergency Department if any problems should occur.  Please show the CHEMOTHERAPY  ALERT CARD or IMMUNOTHERAPY ALERT CARD at check-in to the Emergency Department and triage nurse.  Should you have questions after your visit or need to cancel or reschedule your appointment, please contact [facility] - A DEPT OF [facility]  [phone] and follow the prompts.  Office hours are 8:00 a.m. to 4:30 p.m. Monday - Friday. Please note that voicemails left after 4:00 p.m. may not be returned until the following business day.  We are closed weekends and major holidays. You have access to a nurse at all times for urgent questions. Please call the main number to the clinic [phone] and follow the prompts.  For any non-urgent questions, you may also contact your provider using MyChart. We now offer e-Visits for anyone 69 and older to request care online for non-urgent symptoms. For details visit [email or web address].   Also download the MyChart app! Go to the app store, search MyChart, open the app, select [place], and log in with your MyChart username and password.

## 2024-09-11 NOTE — Assessment & Plan Note (Signed)
Continue calcium supplementation 600mg  TID.

## 2024-09-11 NOTE — Assessment & Plan Note (Signed)
continue Lyrica and nortriptyline symptoms stable 

## 2024-09-11 NOTE — Assessment & Plan Note (Signed)
 status post radiation. hold if Calcium  is <8.6 . Hold zometa  due to left lower extremity stress fracture. Awaiting clearance.  Continue calcium  supplementation.

## 2024-09-11 NOTE — Assessment & Plan Note (Signed)
 Chronic tachycardia Follow up with cardiology. She gets ECHO Q3 months.

## 2024-09-11 NOTE — Progress Notes (Signed)
 Hematology/Oncology Progress note [phone] [phone]    REASON FOR VISIT Follow up for  treatment of breast cancer   ASSESSMENT & PLAN:   Cancer Staging  Breast cancer of upper-outer quadrant of right female breast (HCC) Staging form: Breast, AJCC 8th Edition - Clinical: G2, ER+, PR+, HER2- - Signed by Babara Call, MD on 04/05/2018 - Pathologic stage from 04/04/2018: ypT3, ypN2, cM0, G3, ER+, PR-, HER2- - Signed by Babara Call, MD on 04/04/2018 - Pathologic: Stage IV (rpTX, rpNX, rcM1, ER+, PR-, HER2+) - Signed by Babara Call, MD on 12/10/2020   Breast cancer of upper-outer quadrant of right female breast (HCC) Recurrent breast cancer with isolated bone metastasis, stage IV #Initially stage IIIA right breast cancer, ER positive, HER-2 negative status post bilateral oophorectomy,Right mastectomy and right axillary lymph node dissection, status post implant, and implant removal.  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 ER positive, HER-2 positive  On maintenance Kanjinti  Perjecta .- s/p Palliative radiation to T7  10/20/23 PET NED,10/17/23 normal bone density on DEXA 06/04/2024 PET NED  Labs reviewed and discussed Proceed with Kanjinti  Perjecta Continue fulvestrant  monthly. Discussed about the possibility of future discontinuation of treatments, especially if she experiences more cardiology toxicities.   Encounter for monoclonal antibody treatment for malignancy Antibody treatment plan as list above.  Neuropathy due to chemotherapeutic drug continue Lyrica  and nortriptyline symptoms stable  Hypocalcemia Continue calcium  supplementation 600mg  TID.    Malignant neoplasm metastatic to bone [facility]) status post radiation. hold if Calcium  is <8.6 . Hold zometa  due to left lower extremity stress fracture. Awaiting clearance.  Continue calcium  supplementation.    Nonischemic  cardiomyopathy (HCC) Chronic tachycardia Follow up with cardiology. She gets ECHO Q3 months.   Normocytic anemia Normal iron tibc ferritin, normal  B12 and folate.  Monitor. - she can not tolerate oral iron supplementation due to diarrhea Anemia could be due to methotrexate, anemia of chronic disease.   Rheumatoid arthritis (HCC) Follow up with  rheumatology.  Patient is on methotrexate,  plaquenil.     Orders Placed This Encounter  Procedures   IR Fluoro Guide CV Line Right    Standing Status:   Future    Expiration Date:   09/11/2025    Reason for Exam (SYMPTOM  OR DIAGNOSIS REQUIRED):   no blood return from port    Is the patient pregnant?:   No    Preferred Imaging Location?:   [facility]   Comprehensive metabolic panel    Standing Status:   Future    Expected Date:   10/02/2024    Expiration Date:   10/02/2025   CBC with Differential    Standing Status:   Future    Expected Date:   10/02/2024    Expiration Date:   10/02/2025   Comprehensive metabolic panel    Standing Status:   Future    Expected Date:   10/23/2024    Expiration Date:   10/23/2025   CBC with Differential    Standing Status:   Future    Expected Date:   10/23/2024    Expiration Date:   10/23/2025   Follow up  lab Kanjinti  Perjecta 3 weeks.  lab MD Kanjinti  Perjecta 6 weeks.  fulvestrant  Q 4 weeks   All questions were answered. The patient knows to call the clinic with any problems, questions or concerns.  Call Babara, MD, PhD [facility] Hematology Oncology 09/11/2024  Oncology History  Oncology History Overview Note        Malignant neoplasm of overlapping sites of right breast in female, estrogen receptor positive (HCC)  Breast cancer of upper-outer quadrant of right female breast (HCC)  10/19/2017 Initial Diagnosis   Malignant neoplasm of overlapping sites of right breast in female, estrogen receptor positive (HCC)   11/08/2017 - 01/03/2018 Chemotherapy   Neoadjuvant  ddAC x 4 + 1 cycle of Taxol   due to lack of response, surgery was offered.   03/19/2018 Surgery   S/p right mastectomy and right axillary dissection, immediate breast reconstruction with placement of expanders.  ypT3 ypN2, + lymphovascular invasion,  Grade 3, margin is negative, close. ER 90%, PR 0%, HER2 IHC negative.   # elective bilateral salpingo-oophorectomy..    06/11/2018 Imaging   s/p 11 cycles Taxol  adjuvantly   10/10/2018 -  Radiation Therapy   adjuvant right chest wall radiation   06/03/2019 Surgery   underwent elective left prophylactic mastectomy and sentinel lymph node biopsy of left axilla Pathology negative for malignancy  # Mediport removal  # right implant removal on    07/07/2020 Imaging   MRI thoracic spine without contrast showed lesions involving the T7 posterior elements most concerning for metastatic lesion.  No evidence of epidural tumor.  Minimal thoracic spondylosis without stenosis. MRI was reviewed by me and a PET scan was obtained for further evaluation   07/20/2020 Imaging   PET scan showed hypermetabolic metastasis involving the posterior element of T7, no additional evidence of metastasis in the neck, chest, abdomen or pelvis.   07/29/2020 Procedure   T7 lesion biopsy showed metastatic carcinoma, compatible with breast origin.  Receptor status staining showed ER 71-80% positive, PR negative, HER-2 positive IHC 3+   08/31/2020 -  Radiation Therapy    finished spine radiation    09/17/2020 -  Chemotherapy   Patient is on Treatment Plan :  BREAST Trastuzumab  + Pertuzumab  q21d     12/10/2020 Cancer Staging   Staging form: Breast, AJCC 8th Edition - Pathologic: Stage IV (rpTX, pNX, cM1, ER+, PR-, HER2+) - Signed by Babara Call, MD on 12/10/2020   03/15/2021 Imaging    PET scan showed no focal hypermetabolic activity to suggest skeletal metastasis.  Mild hypermetabolic activity along the right T7-8 paraspinal musculature.  Max SUV 3.3.  Likely postprocedural-    09/28/2021 Echocardiogram   further decrease of LVEF to 45%   10/14/2021 Imaging   PET  Post bilateral mastectomy and RIGHT axillary dissection without signs of recurrent or metastatic disease.   11/12/2021 Echocardiogram   LVEF of 45-50%.   02/18/2022 Imaging   MRI thoracic and lumbar spine w wo contrast  1. Negative MRIs of the thoracic and lumbar spine. No evidence for locally recurrent metastasis at the level of T7. No other new metastatic disease elsewhere with thoracolumbar spine. 2. No significant disc pathology, stenosis, or evidence for neural impingement.    02/25/2022 Echocardiogram   LVEF 55-60%.   03/16/2022 Imaging   PET showed Stable PET-CT. No findings for local recurrent breast cancer,locoregional adenopathy or distant metastatic disease.    07/20/2022 Imaging   MRI Thoracic Spine w wo contrast 1. Interval resolution of the increased T2 signal contrast enhancement in the posterior elements of T7. No new lesion identified. 2. No spinal canal or neural foraminal stenosis.   08/23/2022 Echocardiogram   Stable low-normal LVEF at 50-55%.   10/05/2022 Imaging   PET restaging Stable examination without evidence of hypermetabolic recurrence or  metastatic disease.   Persistent minimally metabolic stranding in the posterior bilateral gluteal soft tissues may reflect sequela of subcutaneous injections   02/24/2023 Echocardiogram   Echocardiogram showed stable low normal LVEF 50-55%.   04/07/2023 Imaging   PET scan  1. Status post bilateral mastectomies without evidence of hypermetabolic local recurrence. 2. No evidence of hypermetabolic metastatic disease. 3. Asymmetric right-sided tonsillar FDG avidity is nonspecific consider further evaluation with direct visualization. 4. Gas fluid levels in the proximal colon may reflect a diarrheal illness or laxative use.   02/27/2024 Echocardiogram   LVEF 35-40%   06/04/2024 Imaging   PET scan showed No evidence of recurrent  or metastatic disease.     +MRSA infection of lowe extremity, finished a course of doxycycline    INTERVAL HISTORY 43 yo female with above oncology history reviewed by me presents for follow-up of management of metastatic Triple positive breast cancer.  Intermittent diarrhea, manageable. Oral iron makes diarrhea worse, she stopped.  + arthralgia + right lower back pain, intermittent,chronic.       Review of Systems  Constitutional:  Negative for chills, fever, malaise/fatigue and weight loss.  HENT:  Negative for sore throat.   Eyes:  Negative for redness.  Respiratory:  Negative for cough, shortness of breath and wheezing.   Cardiovascular:  Negative for chest pain, palpitations and leg swelling.  Gastrointestinal:  Negative for abdominal pain, blood in stool, heartburn, nausea and vomiting.  Genitourinary:  Negative for dysuria.  Musculoskeletal:  Positive for back pain and joint pain.  Skin:  Negative for rash.  Neurological:  Positive for tingling. Negative for dizziness and tremors.  Endo/Heme/Allergies:  Does not bruise/bleed easily.  Psychiatric/Behavioral:  Negative for hallucinations. The patient does not have insomnia.     No Known Allergies  Patient Active Problem List   Diagnosis Date Noted   Breast cancer of upper-outer quadrant of right female breast (HCC) 03/19/2018    Priority: High   Normocytic anemia 06/23/2023    Priority: Medium    Hypokalemia 06/03/2022    Priority: Medium    Malignant neoplasm metastatic to bone (HCC) 03/25/2021    Priority: Medium    Encounter for monoclonal antibody treatment for malignancy 12/10/2020    Priority: Medium    Hypocalcemia 11/19/2020    Priority: Medium    Neuropathy due to chemotherapeutic drug 08/11/2020    Priority: Medium    Rheumatoid arthritis (HCC) 10/13/2019    Priority: Medium    Nonischemic cardiomyopathy (HCC) 08/23/2018    Priority: Medium    Elevated serum creatinine 06/19/2024    Priority: Low    Low back pain radiating to right lower extremity 12/22/2023    Priority: Low   Folliculitis and perifolliculitis 04/14/2023    Priority: Low   Abnormal bowel movement 04/14/2023    Priority: Low   Right shoulder pain 01/20/2023    Priority: Low   Thyroid  nodule 06/04/2022    Priority: Low   Encounter for antineoplastic chemotherapy 10/29/2020    Priority: Low   Goals of care, counseling/discussion 08/11/2020    Priority: Low   Gastroesophageal reflux disease without esophagitis 02/24/2017    Priority: Low   Malignant tumor of breast (HCC) 07/11/2023   Stress fracture of metatarsal bone of right foot 07/11/2023   Change in bowel habits 06/23/2023   Closed fracture of fifth metatarsal bone 05/09/2023   Tachycardia, unspecified 04/15/2021   Bone lesion 11/19/2020   Inflammatory arthritis 11/19/2020   HER2-positive carcinoma of breast (HCC) 08/11/2020  Bilateral hand swelling 10/03/2019   Fracture of neck of metacarpal bone 05/14/2019   Chronic fatigue 04/09/2019   Polyarthralgia 04/09/2019   Status post right breast reconstruction 02/26/2019   Status post right mastectomy 02/26/2019   Mastalgia 02/15/2019   Shortness of breath 08/23/2018   Preprocedural cardiovascular examination 08/23/2018   Tachycardia 08/23/2018   Palpitations 08/23/2018   Estrogen receptor positive status (ER+) 04/04/2018   Acquired absence of right breast and nipple 04/03/2018   Family history of cancer    Malignant neoplasm of overlapping sites of right breast in female, estrogen receptor positive (HCC) 10/19/2017   Generalized anxiety disorder 10/03/2014   Headache 10/03/2014     Past Medical History:  Diagnosis Date   Anemia    Arthritis    BRCA negative 11/26/2017   Breast cancer (HCC) 10/11/2017   Multifocal, ER positive, PR negative, HER-2 negative. ypT3 ypN2a 8.7 cm, 4/15 nodes   Cardiomyopathy (HCC)    a. 10/2017 Echo: EF 60-65%, no rwma, Gr1 DD, nl RV size/fxn; b. 04/2018 Echo: EF  55-60%, no rwma, Nl RV size/fxn; c. 08/2018 Echo: EF 45%, diff HK, ? HK of antsept wall. Gr1 DD. Mild MR. Mild LAE/RAE. Mod dil RV.    Chronic bronchitis (HCC) 11/2017   COPD (chronic obstructive pulmonary disease) (HCC)    MILD PER CXR   Depression    Family history of cancer    GERD (gastroesophageal reflux disease)    Headache    MIGRAINES   Heart murmur    ASYMPTOMATIC   Personal history of chemotherapy    current for right breast ca     Past Surgical History:  Procedure Laterality Date   AXILLARY LYMPH NODE DISSECTION Right 03/19/2018   Procedure: AXILLARY LYMPH NODE DISSECTION;  Surgeon: Dessa Reyes ORN, MD;  Location: [facility] ORS;  Service: General;  Laterality: Right;   BIOPSY  06/23/2023   Procedure: BIOPSY;  Surgeon: Therisa Bi, MD;  Location: [facility] ENDOSCOPY;  Service: Gastroenterology;;   BREAST BIOPSY Right 10/11/2017   12:30 posterior coil clip invasive mammary carcinoma   BREAST BIOPSY Right 10/11/2017   11:30 middle depth ribbon clip DCIS   BREAST BIOPSY Right 10/11/2017   5:30 anterior depth x shape invasive ductal carcinoma   BREAST IMPLANT REMOVAL Right 06/03/2019   Procedure: REMOVAL OF RIGHT BREAST IMPLANTS;  Surgeon: Lowery Estefana RAMAN, DO;  Location: [facility] ORS;  Service: Plastics;  Laterality: Right;   BREAST RECONSTRUCTION WITH PLACEMENT OF TISSUE EXPANDER AND FLEX HD (ACELLULAR HYDRATED DERMIS) Right 03/19/2018   Procedure: BREAST RECONSTRUCTION WITH PLACEMENT OF TISSUE EXPANDER AND FLEX HD (ACELLULAR HYDRATED DERMIS);  Surgeon: Lowery Estefana RAMAN, DO;  Location: [facility] ORS;  Service: Plastics;  Laterality: Right;   CARPAL TUNNEL RELEASE Bilateral 2020   CHOLECYSTECTOMY N/A 04/27/2020   Procedure: LAPAROSCOPIC CHOLECYSTECTOMY WITH INTRAOPERATIVE CHOLANGIOGRAM;  Surgeon: Dessa Reyes ORN, MD;  Location: [facility] ORS;  Service: General;  Laterality: N/A;   COLONOSCOPY WITH PROPOFOL  N/A 06/23/2023   Procedure: COLONOSCOPY WITH PROPOFOL ;  Surgeon: Therisa Bi, MD;   Location: [facility] ENDOSCOPY;  Service: Gastroenterology;  Laterality: N/A;   ESOPHAGOGASTRODUODENOSCOPY (EGD) WITH PROPOFOL  N/A 04/17/2020   Procedure: ESOPHAGOGASTRODUODENOSCOPY (EGD) WITH PROPOFOL ;  Surgeon: Dessa Reyes ORN, MD;  Location: [facility] ENDOSCOPY;  Service: Endoscopy;  Laterality: N/A;  with biopsy   ESOPHAGOGASTRODUODENOSCOPY (EGD) WITH PROPOFOL  N/A 06/23/2023   Procedure: ESOPHAGOGASTRODUODENOSCOPY (EGD) WITH PROPOFOL ;  Surgeon: Therisa Bi, MD;  Location: [facility] ENDOSCOPY;  Service: Gastroenterology;  Laterality: N/A;   LAPAROSCOPIC BILATERAL SALPINGO OOPHERECTOMY Bilateral  03/19/2018   Procedure: LAPAROSCOPIC BILATERAL SALPINGO OOPHORECTOMY;  Surgeon: Verdon Keen, MD;  Location: [facility] ORS;  Service: Gynecology;  Laterality: Bilateral;   MASTECTOMY Right 03/2018   MASTECTOMY W/ SENTINEL NODE BIOPSY Right 03/19/2018   Procedure: MASTECTOMY WITH SENTINEL LYMPH NODE BIOPSY;  Surgeon: Dessa Reyes ORN, MD;  Location: [facility]S;  Service: General;  Laterality: Right;   PORT-A-CATH REMOVAL Left 06/03/2019   Procedure: REMOVAL PORT-A-CATH;  Surgeon: Dessa Reyes ORN, MD;  Location: [facility];  Service: General;  Laterality: Left;   PORTACATH PLACEMENT Left 10/24/2017   Procedure: INSERTION PORT-A-CATH;  Surgeon: Dessa Reyes ORN, MD;  Location: [facility];  Service: General;  Laterality: Left;   PORTACATH PLACEMENT Right 09/28/2020   Procedure: INSERTION PORT-A-CATH;  Surgeon: Dessa Reyes ORN, MD;  Location: [facility];  Service: General;  Laterality: Right;   REMOVAL OF TISSUE EXPANDER AND PLACEMENT OF IMPLANT Right 07/20/2018   Procedure: REMOVAL OF RIGHT BREAST TISSUE EXPANDER AND PLACEMENT OF IMPLANT;  Surgeon: Lowery Estefana RAMAN, DO;  Location: [facility];  Service: Plastics;  Laterality: Right;   SIMPLE MASTECTOMY WITH AXILLARY SENTINEL NODE BIOPSY Left 06/03/2019   Procedure: SIMPLE MASTECTOMY LEFT;  Surgeon: Dessa Reyes ORN, MD;  Location: [facility]S;  Service: General;   Laterality: Left;    Social History   Socioeconomic History   Marital status: Married    Spouse name: Not on file   Number of children: Not on file   Years of education: Not on file   Highest education level: Not on file  Occupational History   Occupation: pharmacy tech    Comment: Acupuncturist [employer]   Tobacco Use   Smoking status: Former    Current packs/day: 0.00    Average packs/day: 0.5 packs/day for 18.0 years (9.0 ttl pk-yrs)    Types: Cigarettes    Start date: 06/21/2018    Quit date: 12/11/2022    Years since quitting: 1.7   Smokeless tobacco: Never  Vaping Use   Vaping status: Never Used  Substance and Sexual Activity   Alcohol use: No   Drug use: No   Sexual activity: Yes    Birth control/protection: Injection, Other-see comments    Comment: has had hysterectomy  Other Topics Concern   Not on file  Social History Narrative   Lives at home with husband and daughter   Social Drivers of Corporate investment banker Strain: Not on file  Food Insecurity: No Food Insecurity (12/25/2023)   Hunger Vital Sign    Worried About Running Out of Food in the Last Year: Never true    Ran Out of Food in the Last Year: Never true  Transportation Needs: No Transportation Needs (12/25/2023)   PRAPARE - Administrator, Civil Service (Medical): No    Lack of Transportation (Non-Medical): No  Physical Activity: Not on file  Stress: Not on file  Social Connections: Not on file  Intimate Partner Violence: Not on file     Family History  Problem Relation Age of Onset   Diabetes Father    Hypertension Father    Hyperlipidemia Father    Heart attack Father 63       mild   Melanoma Maternal Aunt        other aunts with BCC/SCC/Melanoma   Cervical cancer Maternal Aunt 74       daughter w/ cervical cancer as well   Lung cancer Maternal Aunt    Melanoma Maternal Uncle  other uncles with BCC/SCC/Melanoma   Breast cancer Paternal Aunt     Bladder Cancer Maternal Grandmother   Biological mother had Grave's disease.    Current Outpatient Medications:    acetaminophen  (TYLENOL ) 500 MG tablet, Take 500 mg by mouth every 6 (six) hours as needed., Disp: , Rfl:    albuterol  (VENTOLIN  HFA) 108 (90 Base) MCG/ACT inhaler, Inhale 2 puffs into the lungs every 6 (six) hours as needed for wheezing or shortness of breath. , Disp: , Rfl:    CALCIUM  CARBONATE-VITAMIN D  PO, Take 600-800 mg by mouth in the morning, at noon, and at bedtime., Disp: , Rfl:    chlorhexidine  (PERIDEX ) 0.12 % solution, Use as directed 15 mLs in the mouth or throat 2 (two) times daily., Disp: 120 mL, Rfl: 0   cyanocobalamin  (,VITAMIN B-12,) 1000 MCG/ML injection, Inject 1,000 mcg into the skin every 30 (thirty) days., Disp: , Rfl:    diphenoxylate -atropine  (LOMOTIL ) 2.5-0.025 MG tablet, Take 1 tablet by mouth 4 (four) times daily as needed for diarrhea or loose stools., Disp: 60 tablet, Rfl: 0   escitalopram  (LEXAPRO ) 20 MG tablet, Take 20 mg by mouth daily., Disp: , Rfl:    esomeprazole (NEXIUM) 40 MG capsule, Take 40 mg by mouth daily before breakfast. , Disp: , Rfl:    Eszopiclone 3 MG TABS, Take 3 mg by mouth at bedtime as needed (sleep). , Disp: , Rfl:    folic acid  (FOLVITE ) 1 MG tablet, Take 1 mg by mouth daily., Disp: , Rfl:    hydrocortisone 2.5 % cream, , Disp: , Rfl:    hydroxychloroquine (PLAQUENIL) 200 MG tablet, Take 200 mg by mouth daily., Disp: , Rfl:    hydrOXYzine  (ATARAX ) 10 MG tablet, Take 1 tablet (10 mg total) by mouth every 6 (six) hours as needed., Disp: 60 tablet, Rfl: 0   ibuprofen  (ADVIL ) 800 MG tablet, Take 800 mg by mouth every 8 (eight) hours as needed for moderate pain., Disp: , Rfl:    loperamide  (IMODIUM ) 2 MG capsule, Take 1 tablet (2 mg total) by mouth See admin instructions. Take 2 tablets with onset of diarrhea, then take 1 tablet every 2 hours until diarrhea stops. Maximum 8 tablets in 24, Disp: 90 capsule, Rfl: 0   loratadine   (CLARITIN ) 10 MG tablet, Take 10 mg by mouth daily. , Disp: , Rfl:    LORazepam (ATIVAN) 1 MG tablet, Take 1 mg by mouth 3 (three) times daily., Disp: , Rfl:    Magnesium 500 MG TABS, Take 500 mg by mouth 2 (two) times daily., Disp: , Rfl:    methocarbamol  (ROBAXIN ) 500 MG tablet, Take by mouth., Disp: , Rfl:    methotrexate (RHEUMATREX) 2.5 MG tablet, Take 25 mg by mouth every Sunday. 10 tablets once a week, Disp: , Rfl:    metoprolol  succinate (TOPROL  XL) 25 MG 24 hr tablet, Take 1 tablet (25 mg total) by mouth 2 (two) times daily. Monitor for lightheadedness and dizziness., Disp: 180 tablet, Rfl: 1   mupirocin  ointment (BACTROBAN ) 2 %, Place 1 application into the nose 2 (two) times daily. Use in each nostril twice daily for five (5) days., Disp: 22 g, Rfl: 5   nortriptyline (PAMELOR) 10 MG capsule, Take 30 mg by mouth at bedtime. , Disp: , Rfl:    oxyCODONE  (OXY IR/ROXICODONE ) 5 MG immediate release tablet, Take 1 tablet (5 mg total) by mouth every 6 (six) hours as needed for moderate pain (pain score 4-6) or severe pain (pain score  7-10)., Disp: 90 tablet, Rfl: 0   phentermine (ADIPEX-P) 37.5 MG tablet, Take 37.5 mg by mouth daily before breakfast. , Disp: , Rfl:    pregabalin  (LYRICA ) 150 MG capsule, Take 150 mg by mouth 2 (two) times daily., Disp: , Rfl:    promethazine  (PHENERGAN ) 25 MG tablet, Take 1 tablet (25 mg total) by mouth every 8 (eight) hours as needed for nausea or vomiting., Disp: 90 tablet, Rfl: 0   pyridOXINE (VITAMIN B-6) 100 MG tablet, Take 100 mg by mouth daily., Disp: , Rfl:    topiramate (TOPAMAX) 50 MG tablet, Take 50 mg by mouth 2 (two) times daily. , Disp: , Rfl:    valsartan  (DIOVAN ) 40 MG tablet, Take 0.5 tablets (20 mg total) by mouth 2 (two) times daily., Disp: 90 tablet, Rfl: 3   vitamin C (ASCORBIC ACID) 500 MG tablet, Take 500 mg by mouth daily., Disp: , Rfl:  No current facility-administered medications for this visit.  Facility-Administered Medications Ordered  in Other Visits:    fulvestrant  (FASLODEX ) injection 500 mg, 500 mg, Intramuscular, Q30 days, Babara Call, MD, 500 mg at 09/11/24 0950   heparin  lock flush 100 unit/mL, 500 Units, Intravenous, Once, Babara Call, MD   sodium chloride  flush (NS) 0.9 % injection 10 mL, 10 mL, Intravenous, PRN, Babara Call, MD, 10 mL at 11/19/18 1249   sodium chloride  flush (NS) 0.9 % injection 10 mL, 10 mL, Intravenous, Once, Babara Call, MD   Physical exam:  Vitals:   09/11/24 0830  BP: 111/60  Pulse: 88  Resp: 16  Temp: (!) 97.4 F (36.3 C)  TempSrc: Tympanic  SpO2: 100%  Weight: 174 lb (78.9 kg)  ECOG 1 Physical Exam Constitutional:      Appearance: Normal appearance.  HENT:     Head: Normocephalic and atraumatic.  Eyes:     General: No scleral icterus. Neck:     Vascular: No JVD.  Cardiovascular:     Rate and Rhythm: Normal rate.  Pulmonary:     Effort: Pulmonary effort is normal. No respiratory distress.     Breath sounds: Normal breath sounds.  Abdominal:     General: There is no distension.     Palpations: Abdomen is soft.  Musculoskeletal:     Cervical back: Normal range of motion.  Lymphadenopathy:     Cervical: No cervical adenopathy.  Skin:    General: Skin is warm.     Findings: No erythema or rash.  Neurological:     Mental Status: She is alert and oriented to person, place, and time. Mental status is at baseline.     Cranial Nerves: No cranial nerve deficit.     Motor: No abnormal muscle tone.  Psychiatric:        Mood and Affect: Mood and affect normal.        Labs     Latest Ref Rng & Units 09/11/2024    8:06 AM  CMP  Glucose 70 - 99 mg/dL 888   BUN 6 - 20 mg/dL 18   Creatinine 9.55 - 1.00 mg/dL 9.01   Sodium 864 - 854 mmol/L 139   Potassium 3.5 - 5.1 mmol/L 4.2   Chloride 98 - 111 mmol/L 110   CO2 22 - 32 mmol/L 23   Calcium  8.9 - 10.3 mg/dL 8.9   Total Protein 6.5 - 8.1 g/dL 6.8   Total Bilirubin 0.0 - 1.2 mg/dL 0.4   Alkaline Phos 38 - 126 U/L 70   AST 15 -  41 U/L 27   ALT 0 - 44 U/L 18       Latest Ref Rng & Units 09/11/2024    8:06 AM  CBC  WBC 4.0 - 10.5 K/uL 7.0   Hemoglobin 12.0 - 15.0 g/dL 89.3   Hematocrit 63.9 - 46.0 % 31.6   Platelets 150 - 400 K/uL 248    RADIOGRAPHIC STUDIES: I have personally reviewed the radiological images as listed and agreed with the findings in the report. No results found.

## 2024-09-11 NOTE — Assessment & Plan Note (Signed)
Antibody treatment plan as list above. 

## 2024-09-12 ENCOUNTER — Ambulatory Visit
Admission: RE | Admit: 2024-09-12 | Discharge: 2024-09-12 | Disposition: A | Discharge status: Home / Self Care | Source: Ambulatory Visit | Attending: Oncology | Admitting: Oncology

## 2024-09-12 DIAGNOSIS — Z452 Encounter for adjustment and management of vascular access device: Secondary | ICD-10-CM | POA: Insufficient documentation

## 2024-09-12 DIAGNOSIS — Z95828 Presence of other vascular implants and grafts: Secondary | ICD-10-CM

## 2024-09-12 MED ORDER — IOHEXOL 300 MG/ML  SOLN
10.0000 mL | Freq: Once | INTRAMUSCULAR | Status: AC | PRN
  Administered 2024-09-12: 10 mL via INTRAVENOUS

## 2024-09-12 MED ORDER — HEPARIN SOD (PORK) LOCK FLUSH 100 UNIT/ML IV SOLN
INTRAVENOUS | Status: AC
  Filled 2024-09-12: qty 5

## 2024-09-13 ENCOUNTER — Other Ambulatory Visit: Payer: Self-pay

## 2024-09-15 ENCOUNTER — Other Ambulatory Visit: Payer: Self-pay

## 2024-09-17 ENCOUNTER — Ambulatory Visit
Admission: RE | Admit: 2024-09-17 | Discharge: 2024-09-17 | Disposition: A | Discharge status: Home / Self Care | Source: Ambulatory Visit | Attending: Cardiology | Admitting: Cardiology

## 2024-09-17 DIAGNOSIS — K449 Diaphragmatic hernia without obstruction or gangrene: Secondary | ICD-10-CM | POA: Insufficient documentation

## 2024-09-17 DIAGNOSIS — I427 Cardiomyopathy due to drug and external agent: Secondary | ICD-10-CM | POA: Insufficient documentation

## 2024-09-17 DIAGNOSIS — Z853 Personal history of malignant neoplasm of breast: Secondary | ICD-10-CM | POA: Insufficient documentation

## 2024-09-17 DIAGNOSIS — Z9010 Acquired absence of unspecified breast and nipple: Secondary | ICD-10-CM | POA: Insufficient documentation

## 2024-09-17 DIAGNOSIS — T451X5A Adverse effect of antineoplastic and immunosuppressive drugs, initial encounter: Secondary | ICD-10-CM | POA: Insufficient documentation

## 2024-09-17 LAB — ECHOCARDIOGRAM COMPLETE
AR max vel: 2.6 cm2
AV Area VTI: 2.83 cm2
AV Area mean vel: 2.56 cm2
AV Mean grad: 2.5 mmHg
AV Peak grad: 4.4 mmHg
Ao pk vel: 1.05 m/s
Area-P 1/2: 7.22 cm2
Calc EF: 42.1 %
S' Lateral: 3.3 cm
Single Plane A2C EF: 44 %
Single Plane A4C EF: 39.5 %

## 2024-09-17 NOTE — Progress Notes (Signed)
*  PRELIMINARY RESULTS* Echocardiogram 2D Echocardiogram has been performed.  Theresa Chaney 09/17/2024, 10:17 AM

## 2024-09-19 ENCOUNTER — Other Ambulatory Visit: Payer: Self-pay | Admitting: Oncology

## 2024-09-19 ENCOUNTER — Encounter: Payer: Self-pay | Admitting: Oncology

## 2024-09-20 ENCOUNTER — Ambulatory Visit (HOSPITAL_COMMUNITY): Payer: Self-pay | Admitting: Cardiology

## 2024-09-20 ENCOUNTER — Other Ambulatory Visit: Payer: Self-pay

## 2024-09-20 DIAGNOSIS — C50411 Malignant neoplasm of upper-outer quadrant of right female breast: Secondary | ICD-10-CM

## 2024-09-22 ENCOUNTER — Other Ambulatory Visit: Payer: Self-pay

## 2024-09-23 ENCOUNTER — Other Ambulatory Visit: Payer: Self-pay

## 2024-09-24 ENCOUNTER — Encounter: Payer: Self-pay | Admitting: Cardiology

## 2024-10-01 ENCOUNTER — Encounter: Payer: Self-pay | Admitting: Oncology

## 2024-10-02 ENCOUNTER — Ambulatory Visit

## 2024-10-02 ENCOUNTER — Other Ambulatory Visit

## 2024-10-09 ENCOUNTER — Ambulatory Visit

## 2024-10-09 ENCOUNTER — Inpatient Hospital Stay

## 2024-10-09 DIAGNOSIS — Z5111 Encounter for antineoplastic chemotherapy: Secondary | ICD-10-CM | POA: Diagnosis not present

## 2024-10-09 DIAGNOSIS — Z170 Estrogen receptor positive status [ER+]: Secondary | ICD-10-CM

## 2024-10-09 MED ORDER — FULVESTRANT 250 MG/5ML IM SOSY
500.0000 mg | PREFILLED_SYRINGE | INTRAMUSCULAR | Status: DC
  Administered 2024-10-09: 500 mg via INTRAMUSCULAR
  Filled 2024-10-09: qty 10

## 2024-10-10 ENCOUNTER — Other Ambulatory Visit: Payer: Self-pay | Admitting: Oncology

## 2024-10-10 MED ORDER — OXYCODONE HCL 5 MG PO TABS: 5.0000 mg | ORAL_TABLET | Freq: Four times a day (QID) | ORAL | 0 refills | Status: DC | PRN

## 2024-10-21 ENCOUNTER — Ambulatory Visit: Admitting: Cardiology

## 2024-10-23 ENCOUNTER — Other Ambulatory Visit

## 2024-10-23 ENCOUNTER — Ambulatory Visit

## 2024-10-23 ENCOUNTER — Ambulatory Visit: Admitting: Oncology

## 2024-11-04 ENCOUNTER — Telehealth: Payer: Self-pay | Admitting: Cardiology

## 2024-11-04 NOTE — Telephone Encounter (Signed)
 Called to confirm/remind patient of their appointment at the [facility] on 11/05/24.   Appointment:   [] Confirmed  [x] Left mess   [] No answer/No voice mail  [] VM Full/unable to leave message  [] Phone not in service  Patient reminded to bring all medications and/or complete list.  Confirmed patient has transportation. Gave directions, instructed to utilize valet parking.

## 2024-11-05 ENCOUNTER — Ambulatory Visit: Attending: Cardiology | Admitting: Cardiology

## 2024-11-05 ENCOUNTER — Other Ambulatory Visit (HOSPITAL_COMMUNITY): Payer: Self-pay | Admitting: *Deleted

## 2024-11-05 VITALS — BP 115/65 | HR 98 | Wt 175.0 lb

## 2024-11-05 DIAGNOSIS — I428 Other cardiomyopathies: Secondary | ICD-10-CM | POA: Insufficient documentation

## 2024-11-05 DIAGNOSIS — R5383 Other fatigue: Secondary | ICD-10-CM | POA: Diagnosis not present

## 2024-11-05 DIAGNOSIS — Z853 Personal history of malignant neoplasm of breast: Secondary | ICD-10-CM | POA: Insufficient documentation

## 2024-11-05 DIAGNOSIS — Z79899 Other long term (current) drug therapy: Secondary | ICD-10-CM | POA: Insufficient documentation

## 2024-11-05 DIAGNOSIS — Z79631 Long term (current) use of antimetabolite agent: Secondary | ICD-10-CM | POA: Diagnosis not present

## 2024-11-05 DIAGNOSIS — T451X5A Adverse effect of antineoplastic and immunosuppressive drugs, initial encounter: Secondary | ICD-10-CM | POA: Diagnosis not present

## 2024-11-05 DIAGNOSIS — F17210 Nicotine dependence, cigarettes, uncomplicated: Secondary | ICD-10-CM | POA: Insufficient documentation

## 2024-11-05 DIAGNOSIS — M069 Rheumatoid arthritis, unspecified: Secondary | ICD-10-CM | POA: Diagnosis not present

## 2024-11-05 DIAGNOSIS — Z9011 Acquired absence of right breast and nipple: Secondary | ICD-10-CM | POA: Insufficient documentation

## 2024-11-05 MED ORDER — SPIRONOLACTONE 25 MG PO TABS: 12.5000 mg | ORAL_TABLET | Freq: Every day | ORAL | 3 refills | Status: DC

## 2024-11-05 NOTE — Progress Notes (Signed)
 PCP: Buren Rock HERO, MD Oncology: Dr. Babara Cardiology: Dr. Mady HF Cardiology: Dr. Rolan  Chief complaint: CHF  43 y.o. 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 Patient has breast cancer initially diagnosed in 11/28, treated with doxorubicin /cyclophosphamide  x 4 cycles then Taxol  x 12 cycles.  Right mastectomy + radiation.  Echo in 9/19 showed EF down to 45%, but by 9/20, EF was up to 55-60%.  Prophylactic left mastectomy in 2020. She was found to have T-spine mets in 7/21, biopsy showed ER+/HER2+.  She was started on trastuzumab .  In 10/22, echo showed EF down to 45%.  Trastuzumab  was held and Toprol  XL was started.  EF was up to 55-60% by 3/23.  Patient has been on Kanjinti  (trastuzumab  anns) and Perjeta .  Echo in 3/25 showed EF down to 35-40%, global hypokinesis, normal RV. Kanjinti  was stopped and she was restarted on Toprol  XL (had been off this medication) and then valsartan .  Echo in 6/25 showed EF 50-55%, Kanjinti  was restarted.  Cardiac MRI in 6/25 showed LV EF 48%, no LGE, RV EF 50%. Repeat echo in 10/25 showed EF back down to 35-40% and Kanjinti  was stopped again.   Patient returns for followup of CHF.  She is currently off Kanjinti  after most recent echo.  She reports unchanged generalized fatigue.  Dyspnea only with heavy exertion. No chest pain.  She had some lightheadedness with standing, Toprol  XL cut down to 25 qam/12.5 qpm.  Minimal orthostatic symptoms at this point.  No palpitations. .    Labs (2/25): LDL 138 Labs (3/25): K 3.5, creatinine 0.67 Labs (5/25): K 3.6, creatinine 0.85, BNP 116 Labs (10/25): K 4.2, creatinine 0.98  PMH: 1. Depression 2. GERD 3. Rheumatoid arthritis: on methotrexate, hydroxychloroquine.  4. SVT 5. Migraines 6. Breast cancer: Diagnosed 11/18, treated with doxorubicin /cyclophosphamide  x 4 cycles then Taxol  x 12 cycles.  Right mastectomy + radiation.  Prophylactic left mastectomy in 2020. Bilateral oophorectomy.   - T-spine mets in 7/21. Biopsy showed ER+/HER2+ - Now getting Kanjinti  + Perjeta .  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  - Echo (9/19): EF 45% - Echo (9/20): EF up to 55-60%  - Echo (10/22): EF back down to 45%, trastuzumab  held.  - Echo (12/22): EF 45-50% - Echo (3/23): EF 55-60%, back on trastuzumab  - Echo (3/24): EF 50-55% - Echo (6/24): EF 50-55% - Echo (9/24): EF 45-50% - Echo (12/24): EF 50-55% - Echo (3/25): EF 35-40%, global hypokinesis, normal RV => trastuzumab  anns held.  - Echo (6/25): EF 50-55% => Kanjinti  restarted.  - Cardiac MRI (6/25): LV EF 48%, no LGE, RV EF 50%.  - Echo (10/25): EF 35-40%, global hypokinesis, RV normal => Kanjinti  held.   FH: Father with MI in his 5s, brother with pacemaker in his 31s.   SH: Married, lives in [place], smoking 3-4 cigarettes/day  ROS: All systems reviewed and negative except as per HPI.   Current Outpatient Medications  Medication Sig Dispense Refill   acetaminophen  (TYLENOL ) 500 MG tablet Take 500 mg by mouth every 6 (six) hours as needed.     albuterol  (VENTOLIN  HFA) 108 (90 Base) MCG/ACT inhaler Inhale 2 puffs into the lungs every 6 (six) hours as needed for wheezing or shortness of breath.      CALCIUM  CARBONATE-VITAMIN D  PO Take 600-800 mg by mouth in the morning, at noon, and at bedtime.     chlorhexidine  (PERIDEX ) 0.12 % solution Use as directed 15 mLs in the mouth or throat 2 (  two) times daily. 120 mL 0   cyanocobalamin  (,VITAMIN B-12,) 1000 MCG/ML injection Inject 1,000 mcg into the skin every 30 (thirty) days.     diphenoxylate -atropine  (LOMOTIL ) 2.5-0.025 MG tablet Take 1 tablet by mouth 4 (four) times daily as needed for diarrhea or loose stools. 60 tablet 0   escitalopram  (LEXAPRO ) 20 MG tablet Take 20 mg by mouth daily.     esomeprazole (NEXIUM) 40 MG capsule Take 40 mg by mouth daily before breakfast.      Eszopiclone 3 MG TABS Take 3 mg by mouth at bedtime as  needed (sleep).      folic acid  (FOLVITE ) 1 MG tablet Take 1 mg by mouth daily.     hydrocortisone 2.5 % cream      hydroxychloroquine (PLAQUENIL) 200 MG tablet Take 200 mg by mouth daily.     hydrOXYzine  (ATARAX ) 10 MG tablet Take 1 tablet (10 mg total) by mouth every 6 (six) hours as needed. 60 tablet 0   ibuprofen  (ADVIL ) 800 MG tablet Take 800 mg by mouth every 8 (eight) hours as needed for moderate pain.     loperamide  (IMODIUM ) 2 MG capsule Take 1 tablet (2 mg total) by mouth See admin instructions. Take 2 tablets with onset of diarrhea, then take 1 tablet every 2 hours until diarrhea stops. Maximum 8 tablets in 24 90 capsule 0   loratadine  (CLARITIN ) 10 MG tablet Take 10 mg by mouth daily.      LORazepam (ATIVAN) 1 MG tablet Take 1 mg by mouth 3 (three) times daily.     Magnesium 500 MG TABS Take 500 mg by mouth 2 (two) times daily.     methocarbamol  (ROBAXIN ) 500 MG tablet Take by mouth.     methotrexate (RHEUMATREX) 2.5 MG tablet Take 25 mg by mouth every Sunday. 10 tablets once a week     metoprolol  succinate (TOPROL  XL) 25 MG 24 hr tablet Take 1 tablet (25 mg total) by mouth 2 (two) times daily. Monitor for lightheadedness and dizziness. (Patient taking differently: Take 25 mg by mouth 2 (two) times daily. Monitor for lightheadedness and dizziness. Taking 25 mg in AM and 12.5 mg in PM) 180 tablet 1   mupirocin  ointment (BACTROBAN ) 2 % Place 1 application into the nose 2 (two) times daily. Use in each nostril twice daily for five (5) days. 22 g 5   nortriptyline (PAMELOR) 10 MG capsule Take 30 mg by mouth at bedtime.      oxyCODONE  (OXY IR/ROXICODONE ) 5 MG immediate release tablet Take 1 tablet (5 mg total) by mouth every 6 (six) hours as needed for moderate pain (pain score 4-6) or severe pain (pain score 7-10). 90 tablet 0   phentermine (ADIPEX-P) 37.5 MG tablet Take 37.5 mg by mouth daily before breakfast.      pregabalin  (LYRICA ) 150 MG capsule Take 150 mg by mouth 2 (two) times daily.      promethazine  (PHENERGAN ) 25 MG tablet Take 1 tablet (25 mg total) by mouth every 8 (eight) hours as needed for nausea or vomiting. 90 tablet 0   pyridOXINE (VITAMIN B-6) 100 MG tablet Take 100 mg by mouth daily.     topiramate (TOPAMAX) 50 MG tablet Take 50 mg by mouth 2 (two) times daily.      valsartan  (DIOVAN ) 40 MG tablet Take 0.5 tablets (20 mg total) by mouth 2 (two) times daily. 90 tablet 3   vitamin C (ASCORBIC ACID) 500 MG tablet Take 500 mg by mouth daily.  No current facility-administered medications for this visit.   Facility-Administered Medications Ordered in Other Visits  Medication Dose Route Frequency Provider Last Rate Last Admin   heparin  lock flush 100 unit/mL  500 Units Intravenous Once Babara Call, MD       sodium chloride  flush (NS) 0.9 % injection 10 mL  10 mL Intravenous PRN Babara Call, MD   10 mL at 11/19/18 1249   sodium chloride  flush (NS) 0.9 % injection 10 mL  10 mL Intravenous Once Babara Call, MD       BP 115/65   Pulse 98   Wt 175 lb (79.4 kg)   LMP  (LMP Unknown) Comment: Ovaries and tubes removed.April 2019  SpO2 100%   BMI 27.41 kg/m  General: NAD Neck: No JVD, no thyromegaly or thyroid  nodule.  Lungs: Clear to auscultation bilaterally with normal respiratory effort. CV: Nondisplaced PMI.  Heart regular S1/S2, no S3/S4, no murmur.  No peripheral edema.  No carotid bruit.  Normal pedal pulses.  Abdomen: Soft, nontender, no hepatosplenomegaly, no distention.  Skin: Intact without lesions or rashes.  Neurologic: Alert and oriented x 3.  Psych: Normal affect. Extremities: No clubbing or cyanosis.  HEENT: Normal.   Assessment/Plan: 1. Cardiomyopathy: I suspect patient has a baseline mild cardiomyopathy due to prior doxorubicin .  This seems to worsen when she gets trastuzumab -based chemotherapy but has appeared somewhat reversible with stopping trastuzumab -based chemo.  Patient had  a prior drop in EF while on Herceptin -based chemotherapy in 10/22,  trastuzumab  was held and Toprol  XL started with increase in EF back to normal range. Kanjinti  was begun, echo in 3/25 showed EF 35-40%, global hypokinesis, normal RV, and Kanjinti  was stopped.  Echo in 6/25 showed EF up to 50-55% and Kanjinti  restarted.  Cardiac MRI in 6/25 showed LV EF 48%, no delayed enhancement.  Most recently, patient experienced another drop in EF on 10/25 echo to 35-40% range and Kanjinti  was stopped gain.  Chronic NYHA class II symptoms and not volume overloaded on exam.  - Stay off trastuzumab -based chemotherapy for now.  - Continue Toprol  XL 25 qam/12.5 qpm.  Orthostatic at higher dose.  - Continue valsartan  20 mg bid.  - Add spironolactone  12.5 mg at bedtime.  BMET in 10 days.  - Add Farxiga in 2 wks when she sees pharmacist for med titration as long as symptoms/BP are stable.  - Echo in 1/26, if EF is recovered can consider restart trastuzumab -based chemo with understanding that echoes will have to be followed very closely given prior drops.  I am not sure that she has any other options for treatment of her metastatic breast cancer.  2. Smoking: I strongly encouraged her to completely quit.  3. Rheumatoid arthritis: On methotrexate and hydroxychloroquine. Hydroxychloroquine can cause a cardiomyopathy but cardiac MRI did not show typical LGE pattern to suggest hydoxychloroquine CMP.   Followup in 2 wks with HF pharmacist for med titration, see me back in 1/26 after echo.   I spent 31 minutes reviewing records, interviewing/examining patient, and managing orders.   Ezra Shuck 11/05/2024

## 2024-11-05 NOTE — Patient Instructions (Addendum)
 Medication Changes:  START Spironolactone  12.5 mg (1/2 tab) Daily at bedtime  Lab Work:  Your provider would like for you to return in 1-2 weeks to have repeat lab work.  Please go to [facility] [address] ([facility] [address], [place] [address] You do not need an appointment.  They are open from 8 am- 4:30 pm.  Lunch from 1:00 pm- 2:00 pm  Testing/Procedures:  Your physician has requested that you have an echocardiogram. Echocardiography is a painless test that uses sound waves to create images of your heart. It provides your doctor with information about the size and shape of your heart and how well your heart's chambers and valves are working. This procedure takes approximately one hour. There are no restrictions for this procedure. Please do NOT wear cologne, perfume, aftershave, or lotions (deodorant is allowed). Please arrive 15 minutes prior to your appointment time. IN [place], you will be called to schedule this  Please note: We ask at that you not bring children with you during ultrasound (echo/ vascular) testing. Due to room size and safety concerns, children are not allowed in the ultrasound rooms during exams. Our front office staff cannot provide observation of children in our lobby area while testing is being conducted. An adult accompanying a patient to their appointment will only be allowed in the ultrasound room at the discretion of the ultrasound technician under special circumstances. We apologize for any inconvenience.  Special Instructions // Education:  Do the following things EVERYDAY: Weigh yourself in the morning before breakfast. Write it down and keep it in a log. Take your medicines as prescribed Eat low salt foods--Limit salt (sodium) to 2000 mg per day.  Stay as active as you can everyday Limit all fluids for the day to less than 2 liters   Follow-Up in: January 2026, we will call you closer to this time to  schedule    If you have any questions or concerns before your next appointment please send us  a message through [place] or call our office at [phone], If it is after office hours your call will be answered by our answering service and directed appropriately.     At the [facility], you and your health needs are our priority. We have a designated team specialized in the treatment of Heart Failure. This Care Team includes your primary Heart Failure Specialized Cardiologist (physician), Advanced Practice Providers (APPs- Physician Assistants and Nurse Practitioners), and Pharmacist who all work together to provide you with the care you need, when you need it.   You may see any of the following providers on your designated Care Team at your next follow up:  Dr. Toribio Fuel Dr. Ezra Shuck Dr. Ria Commander Dr. Odis Brownie Greig Mosses, NP Caffie Shed, [place] [address] [place], [place] Beckey Coe, NP Jordan Lee, NP Ellouise Class, NP Jaun Bash, PharmD

## 2024-11-06 ENCOUNTER — Inpatient Hospital Stay: Attending: Oncology

## 2024-11-06 DIAGNOSIS — Z170 Estrogen receptor positive status [ER+]: Secondary | ICD-10-CM | POA: Diagnosis present

## 2024-11-06 DIAGNOSIS — Z79899 Other long term (current) drug therapy: Secondary | ICD-10-CM | POA: Diagnosis not present

## 2024-11-06 DIAGNOSIS — Z5111 Encounter for antineoplastic chemotherapy: Secondary | ICD-10-CM | POA: Diagnosis present

## 2024-11-06 DIAGNOSIS — C50411 Malignant neoplasm of upper-outer quadrant of right female breast: Secondary | ICD-10-CM | POA: Diagnosis present

## 2024-11-06 MED ORDER — FULVESTRANT 250 MG/5ML IM SOSY
500.0000 mg | PREFILLED_SYRINGE | INTRAMUSCULAR | Status: DC
  Administered 2024-11-06: 500 mg via INTRAMUSCULAR
  Filled 2024-11-06: qty 10

## 2024-11-18 ENCOUNTER — Encounter: Payer: Self-pay | Admitting: Cardiology

## 2024-11-20 ENCOUNTER — Other Ambulatory Visit (HOSPITAL_COMMUNITY): Payer: Self-pay

## 2024-11-20 ENCOUNTER — Telehealth: Payer: Self-pay

## 2024-11-20 ENCOUNTER — Encounter: Payer: Self-pay | Admitting: Oncology

## 2024-11-20 ENCOUNTER — Ambulatory Visit: Attending: Family | Admitting: Pharmacist

## 2024-11-20 VITALS — BP 96/72 | HR 98 | Wt 178.2 lb

## 2024-11-20 DIAGNOSIS — I428 Other cardiomyopathies: Secondary | ICD-10-CM | POA: Diagnosis not present

## 2024-11-20 MED ORDER — EMPAGLIFLOZIN 10 MG PO TABS: 10.0000 mg | ORAL_TABLET | Freq: Every day | ORAL | 2 refills | Status: AC

## 2024-11-20 NOTE — Telephone Encounter (Signed)
 Advanced [facility] Failure Patient Advocate Encounter  Test billing for this patient's current coverage ([employer]) returns a $47 copay for 30 day supply of Jardiance, $141 for 90 day supply.  Dapagliflozin is not covered by this plan, and brand Doreen shows $253.82 for 30 days, $761.18 for 90 day supply.  This patient may be eligible for a grant that would cover the cost of these medications to $0.  This test claim was processed through [facility]- copay amounts may vary at other pharmacies due to [employer], or as the patient moves through the different stages of their insurance plan.  Rachel DEL, CPhT Rx Patient Advocate Phone: [phone]

## 2024-11-20 NOTE — Patient Instructions (Signed)
 It was a pleasure seeing you today!  MEDICATIONS: -Start Jardiance 10 mg tablet by mouth once daily -Call if you have questions about your medications.  LABS: -We will call you if your labs need attention.  NEXT APPOINTMENT: Return to clinic 12/18/23 to see Pharmacy, then 01/07/24 to see Dr. Rolan.  In general, to take care of your heart failure: -Limit your fluid intake to 2 Liters (half-gallon) per day.   -Limit your salt intake to ideally 2-3 grams (2000-3000 mg) per day. -Weigh yourself daily and record, and bring that weight diary to your next appointment.  (Weight gain of 2-3 pounds in 1 day typically means fluid weight.) -The medications for your heart are to help your heart and help you live longer.   -Please contact us  before stopping any of your heart medications.  Call the clinic at [phone] with questions or to reschedule future appointments.

## 2024-11-20 NOTE — Progress Notes (Signed)
 [facility] Note  PCP: Buren Rock HERO, MD PCP-Cardiologist: Lonni Hanson, MD HF-Cardiologist: Ezra Shuck, MD  HPI:  43 y.o. 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 Patient has breast cancer initially diagnosed in 10/2017, treated with doxorubicin /cyclophosphamide  x 4 cycles then Taxol  x 12 cycles.  Right mastectomy + radiation.  Echo in 08/2018 showed EF down to 45%, but by 08/2019, EF was up to 55-60%.  Prophylactic left mastectomy in 2020. She was found to have T-spine mets in 06/2020, biopsy showed ER+/HER2+.  She was started on trastuzumab .  In 09/2021, echo showed EF down to 45%.  Trastuzumab  was held and Toprol  XL was started.  EF was up to 55-60% by 02/2022.  Patient has been on Kanjinti  (trastuzumab  anns) and Perjeta .  Echo in 02/2024 showed EF down to 35-40%, global hypokinesis, normal RV. Kanjinti  was stopped and she was restarted on Toprol  XL (had been off this medication) and then valsartan .  Echo in 05/2024 showed EF 50-55%, Kanjinti  was restarted.  Cardiac MRI in 05/2024 showed LV EF 48%, no LGE, RV EF 50%. Repeat echo in 09/2024 showed EF back down to 35-40% and Kanjinti  was stopped again.   Last seen by Dr. Shuck on 11/05/24 where her symptoms were stable. Reported dyspnea with only heavy exertion, baseline fatigue. Her lightheadedness on standing improved after metoprolol  was reduced to 25 in the AM and 12.5 in the PM. At this visit spironolactone  12.5 mg daily was started.  Today Theresa Chaney returns to [facility] for pharmacist medication titration. Reports feeling relatively unchanged since last visit. Experienced stomach pain for a week after starting spironolactone , though this has improved. Reports orthostasis, fatigue, and chest pain are at baseline. Denies palpitations SOB LEE orthopnea orthostasis PND. Reports being able to complete all activities of daily living (ADLs). Is somewhat active throughout the  day, limited by ankle injury. Weight at home is 172-174 pounds. Takes no loop diuretic at home. Appetite has been fair. Does follow a low sodium diet.   Current Heart Failure Medications: Loop diuretic: none Beta-Blocker: metoprolol  succinate 12.5 mg in the AM and 25 mg in the PM ACEI/ARB/ARNI: valsartan  20 mg daily MRA: spironolactone  12.5 mg daily  SGLT2i: none Other: none  Has the patient been experiencing any side effects to the medications prescribed? Yes, patient had GI upset after starting spironolactone  x 1 week, but this has improved over the past few days.  Does the patient have any problems obtaining medications due to transportation or finances? No. Has been getting medicaitons filled at [facility] for reasonable prices.  Understanding of regimen: Good  Understanding of indications: Good  Potential of adherence: Good  Patient understands to avoid NSAIDs.  Patient understands to avoid decongestants.  PMH: 1. Depression 2. GERD 3. Rheumatoid arthritis: on methotrexate, hydroxychloroquine.  4. SVT 5. Migraines 6. Breast cancer: Diagnosed 11/18, treated with doxorubicin /cyclophosphamide  x 4 cycles then Taxol  x 12 cycles.  Right mastectomy + radiation.  Prophylactic left mastectomy in 2020. Bilateral oophorectomy.  - T-spine mets in 7/21. Biopsy showed ER+/HER2+ - Now getting Kanjinti  + Perjeta .  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  - Echo (9/19): EF 45% - Echo (9/20): EF up to 55-60%  - Echo (10/22): EF back down to 45%, trastuzumab  held.  - Echo (12/22): EF 45-50% - Echo (3/23): EF 55-60%, back on trastuzumab  - Echo (3/24): EF 50-55% - Echo (6/24): EF 50-55% - Echo (9/24): EF 45-50% - Echo (12/24): EF 50-55% -  Echo (3/25): EF 35-40%, global hypokinesis, normal RV => trastuzumab  anns held.  - Echo (6/25): EF 50-55% => Kanjinti  restarted.  - Cardiac MRI (6/25): LV EF 48%, no LGE, RV EF 50%.  - Echo (10/25): EF  35-40%, global hypokinesis, RV normal => Kanjinti  held.   Pertinent Lab Values: Creatinine  Date Value Ref Range Status  04/19/2024 1.01 (H) 0.44 - 1.00 mg/dL Final   Creatinine, Ser  Date Value Ref Range Status  09/11/2024 0.98 0.44 - 1.00 mg/dL Final   BUN  Date Value Ref Range Status  09/11/2024 18 6 - 20 mg/dL Final  87/83/7977 12 6 - 20 mg/dL Final   Potassium  Date Value Ref Range Status  09/11/2024 4.2 3.5 - 5.1 mmol/L Final   Sodium  Date Value Ref Range Status  09/11/2024 139 135 - 145 mmol/L Final  11/26/2021 142 134 - 144 mmol/L Final   B Natriuretic Peptide  Date Value Ref Range Status  05/03/2024 116.1 (H) 0.0 - 100.0 pg/mL Final    Comment:    Performed at [facility], [address] [place], [place] [address]   Magnesium  Date Value Ref Range Status  03/24/2023 2.1 1.7 - 2.4 mg/dL Final    Comment:    Performed at [facility], [address] [place], [place] [address]   TSH  Date Value Ref Range Status  01/20/2023 1.318 0.350 - 4.500 uIU/mL Final    Comment:    Performed by a 3rd Generation assay with a functional sensitivity of <=0.01 uIU/mL. Performed at [facility], [address] [place], [place] [address]   01/22/2019 0.701 0.450 - 4.500 uIU/mL Final    Vital Signs: Today's Vitals   11/20/24 1327  BP: 96/72  Pulse: 98  SpO2: 97%  Weight: 178 lb 3.2 oz (80.8 kg)    Assessment/Plan:  1. Cardiomyopathy: MD suspects patient has a baseline mild cardiomyopathy due to prior doxorubicin . Which seemed to worsen when she gets trastuzumab -based chemotherapy, though has appeared somewhat reversible with stopping trastuzumab -based chemo.  Patient had  a prior drop in EF while on Herceptin -based chemotherapy in 09/2021, trastuzumab  was held and Toprol  XL started with increase in EF back to normal range. Kanjinti  was begun, echo in 02/2024 showed EF 35-40%, global hypokinesis, normal RV, and Kanjinti  was stopped.  Echo in  05/2024 showed EF up to 50-55% and Kanjinti  restarted.  Cardiac MRI in 05/2024 showed LV EF 48%, no delayed enhancement.  Most recently, patient experienced another drop in EF on 09/2024 echo to 35-40% range and Kanjinti  was stopped gain.  Chronic NYHA class II symptoms and not volume overloaded on exam. Weight relatively stable. - MD recommended to stay off trastuzumab -based chemotherapy for now.  - Continue Toprol  XL 12.5 qam/25 qpm.  Orthostatic at higher dose, report recently swapped low dose to the AM..  - Continue valsartan  20 mg bid.  - Continue spironolactone  12.5 mg at bedtime.  BMET drawn this AM, pending results. - Add Farxiga 10 mg daily. BP soft but asymptotic. Reports BP at home is 100s/70s mmHg. BMET next visit. - Echo in 12/2024, if EF is recovered MD to consider restarting trastuzumab -based chemo with understanding that echoes will have to be followed very closely given prior drops.   2. Smoking: I strongly encouraged her to completely quit.  3. Rheumatoid arthritis: On methotrexate and hydroxychloroquine. Hydroxychloroquine can cause a cardiomyopathy but cardiac MRI did not show typical LGE pattern to suggest hydoxychloroquine CMP per MD.  Follow up: 2 weeks with pharmacy, 1-2 months with MD  Please do not hesitate to reach out with questions or concerns,  Jaun Bash, PharmD, CPP, BCPS, [facility] Heart Failure Pharmacist  Phone - [phone] 11/20/2024 2:33 PM

## 2024-11-21 ENCOUNTER — Ambulatory Visit (HOSPITAL_COMMUNITY): Payer: Self-pay | Admitting: Cardiology

## 2024-11-21 ENCOUNTER — Encounter: Payer: Self-pay | Admitting: Oncology

## 2024-11-21 LAB — BASIC METABOLIC PANEL WITH GFR
BUN/Creatinine Ratio: 19 (ref 9–23)
BUN: 18 mg/dL (ref 6–24)
CO2: 21 mmol/L (ref 20–29)
Calcium: 9.5 mg/dL (ref 8.7–10.2)
Chloride: 104 mmol/L (ref 96–106)
Creatinine, Ser: 0.96 mg/dL (ref 0.57–1.00)
Glucose: 86 mg/dL (ref 70–99)
Potassium: 4.3 mmol/L (ref 3.5–5.2)
Sodium: 139 mmol/L (ref 134–144)
eGFR: 76 mL/min/1.73 (ref >59)

## 2024-11-25 ENCOUNTER — Encounter: Payer: Self-pay | Admitting: Oncology

## 2024-11-26 ENCOUNTER — Encounter: Payer: Self-pay | Admitting: Oncology

## 2024-11-27 ENCOUNTER — Ambulatory Visit: Admission: RE | Admit: 2024-11-27 | Discharge: 2024-11-27 | Attending: Oncology

## 2024-11-27 DIAGNOSIS — R590 Localized enlarged lymph nodes: Secondary | ICD-10-CM | POA: Insufficient documentation

## 2024-11-27 DIAGNOSIS — Z170 Estrogen receptor positive status [ER+]: Secondary | ICD-10-CM | POA: Diagnosis not present

## 2024-11-27 DIAGNOSIS — C50411 Malignant neoplasm of upper-outer quadrant of right female breast: Secondary | ICD-10-CM | POA: Diagnosis present

## 2024-11-27 LAB — GLUCOSE, CAPILLARY: Glucose-Capillary: 98 mg/dL (ref 70–99)

## 2024-11-27 MED ORDER — FLUDEOXYGLUCOSE F - 18 (FDG) INJECTION
9.5700 | Freq: Once | INTRAVENOUS | Status: AC | PRN
  Administered 2024-11-27: 08:00:00 9.57 via INTRAVENOUS

## 2024-12-02 ENCOUNTER — Encounter: Payer: Self-pay | Admitting: Oncology

## 2024-12-04 ENCOUNTER — Telehealth: Payer: Self-pay | Admitting: Oncology

## 2024-12-04 ENCOUNTER — Other Ambulatory Visit: Payer: Self-pay

## 2024-12-04 ENCOUNTER — Encounter: Payer: Self-pay | Admitting: Oncology

## 2024-12-04 DIAGNOSIS — R948 Abnormal results of function studies of other organs and systems: Secondary | ICD-10-CM

## 2024-12-04 NOTE — Telephone Encounter (Signed)
 Called to sched CT - left vm asking for return call - LH

## 2024-12-06 ENCOUNTER — Inpatient Hospital Stay

## 2024-12-09 ENCOUNTER — Encounter: Payer: Self-pay | Admitting: Oncology

## 2024-12-09 ENCOUNTER — Inpatient Hospital Stay: Attending: Oncology

## 2024-12-09 DIAGNOSIS — Z170 Estrogen receptor positive status [ER+]: Secondary | ICD-10-CM | POA: Diagnosis present

## 2024-12-09 DIAGNOSIS — Z5111 Encounter for antineoplastic chemotherapy: Secondary | ICD-10-CM | POA: Diagnosis present

## 2024-12-09 DIAGNOSIS — Z79899 Other long term (current) drug therapy: Secondary | ICD-10-CM | POA: Insufficient documentation

## 2024-12-09 DIAGNOSIS — C50411 Malignant neoplasm of upper-outer quadrant of right female breast: Secondary | ICD-10-CM | POA: Diagnosis present

## 2024-12-09 MED ORDER — FULVESTRANT 250 MG/5ML IM SOSY
500.0000 mg | PREFILLED_SYRINGE | INTRAMUSCULAR | Status: DC
  Administered 2024-12-09: 500 mg via INTRAMUSCULAR

## 2024-12-16 ENCOUNTER — Telehealth: Payer: Self-pay | Admitting: Pharmacist

## 2024-12-16 NOTE — Telephone Encounter (Signed)
 Called to confirm/remind patient of their appointment at the [facility] on 12/17/24.   Appointment:   [] Confirmed  [x] Left mess   [] No answer/No voice mail  [] VM Full/unable to leave message  [] Phone not in service  Patient reminded to bring all medications and/or complete list.  Confirmed patient has transportation. Gave directions, instructed to utilize valet parking.

## 2024-12-17 ENCOUNTER — Ambulatory Visit: Payer: Self-pay | Admitting: Family

## 2024-12-17 ENCOUNTER — Ambulatory Visit: Admitting: Pharmacist

## 2024-12-17 ENCOUNTER — Other Ambulatory Visit
Admission: RE | Admit: 2024-12-17 | Discharge: 2024-12-17 | Disposition: A | Discharge status: Home / Self Care | Source: Ambulatory Visit | Attending: Family | Admitting: Family

## 2024-12-17 VITALS — BP 102/66 | HR 94 | Wt 178.8 lb

## 2024-12-17 DIAGNOSIS — I427 Cardiomyopathy due to drug and external agent: Secondary | ICD-10-CM | POA: Diagnosis not present

## 2024-12-17 DIAGNOSIS — C7951 Secondary malignant neoplasm of bone: Secondary | ICD-10-CM | POA: Diagnosis not present

## 2024-12-17 DIAGNOSIS — M069 Rheumatoid arthritis, unspecified: Secondary | ICD-10-CM | POA: Diagnosis not present

## 2024-12-17 DIAGNOSIS — Z79899 Other long term (current) drug therapy: Secondary | ICD-10-CM | POA: Diagnosis not present

## 2024-12-17 DIAGNOSIS — Z923 Personal history of irradiation: Secondary | ICD-10-CM | POA: Diagnosis not present

## 2024-12-17 DIAGNOSIS — T451X5A Adverse effect of antineoplastic and immunosuppressive drugs, initial encounter: Secondary | ICD-10-CM

## 2024-12-17 DIAGNOSIS — T451X5D Adverse effect of antineoplastic and immunosuppressive drugs, subsequent encounter: Secondary | ICD-10-CM | POA: Diagnosis not present

## 2024-12-17 DIAGNOSIS — Z853 Personal history of malignant neoplasm of breast: Secondary | ICD-10-CM | POA: Diagnosis not present

## 2024-12-17 LAB — PRO BRAIN NATRIURETIC PEPTIDE: Pro Brain Natriuretic Peptide: 55.3 pg/mL

## 2024-12-17 LAB — BASIC METABOLIC PANEL WITH GFR
Anion gap: 11 (ref 5–15)
BUN: 18 mg/dL (ref 6–20)
CO2: 25 mmol/L (ref 22–32)
Calcium: 9.7 mg/dL (ref 8.9–10.3)
Chloride: 104 mmol/L (ref 98–111)
Creatinine, Ser: 0.87 mg/dL (ref 0.44–1.00)
GFR, Estimated: >60 mL/min
Glucose, Bld: 113 mg/dL — ABNORMAL HIGH (ref 70–99)
Potassium: 4.1 mmol/L (ref 3.5–5.1)
Sodium: 140 mmol/L (ref 135–145)

## 2024-12-17 LAB — TROPONIN T, HIGH SENSITIVITY: Troponin T High Sensitivity: <15 ng/L (ref 0–19)

## 2024-12-17 MED ORDER — SPIRONOLACTONE 25 MG PO TABS: 25.0000 mg | ORAL_TABLET | Freq: Every day | ORAL | 3 refills | Status: DC

## 2024-12-17 NOTE — Patient Instructions (Signed)
 It was a pleasure seeing you today!  MEDICATIONS: -Increase spironolactone  to 25 mg (whole tablet) once daily -Call if you have questions about your medications.  LABS: -We will call you if your labs need attention.  NEXT APPOINTMENT: Return to clinic in 2 weeks to see the heart failure physician.  In general, to take care of your heart failure: -Limit your fluid intake to 2 Liters (half-gallon) per day.   -Limit your salt intake to ideally 2-3 grams (2000-3000 mg) per day. -Weigh yourself daily and record, and bring that weight diary to your next appointment.  (Weight gain of 2-3 pounds in 1 day typically means fluid weight.) -The medications for your heart are to help your heart and help you live longer.   -Please contact us  before stopping any of your heart medications.  Call the clinic at [phone] with questions or to reschedule future appointments.

## 2024-12-17 NOTE — Progress Notes (Signed)
 " Advanced Heart Failure Clinic Note  PCP: [place] HERO, MD PCP-Cardiologist: Lonni Hanson, MD HF-Cardiologist: Ezra Shuck, MD  HPI:  44 y.o. 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 Patient has breast cancer initially diagnosed in 10/2017, treated with doxorubicin /cyclophosphamide  x 4 cycles then Taxol  x 12 cycles.  Right mastectomy + radiation.  Echo in 08/2018 showed EF down to 45%, but by 08/2019, EF was up to 55-60%.  Prophylactic left mastectomy in 2020. She was found to have T-spine mets in 06/2020, biopsy showed ER+/HER2+.  She was started on trastuzumab .  In 09/2021, echo showed EF down to 45%.  Trastuzumab  was held and Toprol  XL was started.  EF was up to 55-60% by 02/2022.  Patient has been on Kanjinti  (trastuzumab  anns) and Perjeta .  Echo in 02/2024 showed EF down to 35-40%, global hypokinesis, normal RV. Kanjinti  was stopped and she was restarted on Toprol  XL (had been off this medication) and then valsartan .  Echo in 05/2024 showed EF 50-55%, Kanjinti  was restarted.  Cardiac MRI in 05/2024 showed LV EF 48%, no LGE, RV EF 50%. Repeat echo in 09/2024 showed EF back down to 35-40% and Kanjinti  was stopped again.   Last seen by Dr. Shuck on 11/05/24 where her symptoms were stable. Reported dyspnea with only heavy exertion, baseline fatigue. Her lightheadedness on standing improved after metoprolol  was reduced to 25 in the AM and 12.5 in the PM. At that visit spironolactone  12.5 mg daily was started.  At last visit with pharmacy, Jardiance  10 mg daily was added.  Today Theresa Chaney returns to [facility] for pharmacist medication titration. Reports feeling more fatigued since last visit, but otherwise no changes. No further stomach cramping with spironolactone . Denies palpitations, SOB, LEE, orthopnea, orthostasis, PND, and chest pain. Reports being able to complete all activities of daily living (ADLs). Is somewhat active throughout the  day, limited by ankle injury. Weight at home is ~172-174 pounds. Takes no loop diuretic at home. Appetite has been fair. Does follow a low sodium diet.   Current Heart Failure Medications: Loop diuretic: none Beta-Blocker: metoprolol  succinate 12.5 mg in the AM and 25 mg in the PM ACEI/ARB/ARNI: valsartan  20 mg twice daily MRA: spironolactone  12.5 mg daily  SGLT2i: Jardiance  10 mg daily Other: none  Has the patient been experiencing any side effects to the medications prescribed? Yes, patient had GI upset after starting spironolactone  x 1 week, but this has improved over the past few days.  Does the patient have any problems obtaining medications due to transportation or finances? No. Has been getting medicaitons filled at [facility] for reasonable prices.  Understanding of regimen: Good  Understanding of indications: Good  Potential of adherence: Good  Patient understands to avoid NSAIDs.  Patient understands to avoid decongestants.  PMH: 1. Depression 2. GERD 3. Rheumatoid arthritis: on methotrexate, hydroxychloroquine.  4. SVT 5. Migraines 6. Breast cancer: Diagnosed 11/18, treated with doxorubicin /cyclophosphamide  x 4 cycles then Taxol  x 12 cycles.  Right mastectomy + radiation.  Prophylactic left mastectomy in 2020. Bilateral oophorectomy.  - T-spine mets in 7/21. Biopsy showed ER+/HER2+ - Now getting Kanjinti  + Perjeta .  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  - Echo (9/19): EF 45% - Echo (9/20): EF up to 55-60%  - Echo (10/22): EF back down to 45%, trastuzumab  held.  - Echo (12/22): EF 45-50% - Echo (3/23): EF 55-60%, back on trastuzumab  - Echo (3/24): EF 50-55% - Echo (6/24): EF 50-55% - Echo (9/24):  EF 45-50% - Echo (12/24): EF 50-55% - Echo (3/25): EF 35-40%, global hypokinesis, normal RV => trastuzumab  anns held.  - Echo (6/25): EF 50-55% => Kanjinti  restarted.  - Cardiac MRI (6/25): LV EF 48%, no LGE, RV EF 50%.   - Echo (10/25): EF 35-40%, global hypokinesis, RV normal => Kanjinti  held.   Pertinent Lab Values: Creatinine  Date Value Ref Range Status  04/19/2024 1.01 (H) 0.44 - 1.00 mg/dL Final   Creatinine, Ser  Date Value Ref Range Status  12/17/2024 0.87 0.44 - 1.00 mg/dL Final   BUN  Date Value Ref Range Status  12/17/2024 18 6 - 20 mg/dL Final  87/89/7974 18 6 - 24 mg/dL Final   Potassium  Date Value Ref Range Status  12/17/2024 4.1 3.5 - 5.1 mmol/L Final   Sodium  Date Value Ref Range Status  12/17/2024 140 135 - 145 mmol/L Final  11/20/2024 139 134 - 144 mmol/L Final   B Natriuretic Peptide  Date Value Ref Range Status  05/03/2024 116.1 (H) 0.0 - 100.0 pg/mL Final    Comment:    Performed at [facility], [address] [place], [place] [address]   Magnesium  Date Value Ref Range Status  03/24/2023 2.1 1.7 - 2.4 mg/dL Final    Comment:    Performed at [facility], [address] [place], [place] [address]   TSH  Date Value Ref Range Status  01/20/2023 1.318 0.350 - 4.500 uIU/mL Final    Comment:    Performed by a 3rd Generation assay with a functional sensitivity of <=0.01 uIU/mL. Performed at [facility], [address] [place], [place] [address]   01/22/2019 0.701 0.450 - 4.500 uIU/mL Final    Vital Signs: Today's Vitals   12/17/24 1043  BP: 102/66  Pulse: 94  SpO2: 97%  Weight: 178 lb 12.8 oz (81.1 kg)     Assessment/Plan:  1. Cardiomyopathy: MD suspects patient has a baseline mild cardiomyopathy due to prior doxorubicin . Which seemed to worsen when she gets trastuzumab -based chemotherapy, though has appeared somewhat reversible with stopping trastuzumab -based chemo.  Patient had a prior drop in EF while on Herceptin -based chemotherapy in 09/2021, trastuzumab  was held and Toprol  XL started with increase in EF back to normal range. Kanjinti  was begun, echo in 02/2024 showed EF 35-40%, global hypokinesis, normal RV, and Kanjinti   was stopped.  Echo in 05/2024 showed EF up to 50-55% and Kanjinti  restarted.  Cardiac MRI in 05/2024 showed LV EF 48%, no delayed enhancement.  Most recently, patient experienced another drop in EF on 09/2024 echo to 35-40% range and Kanjinti  was stopped gain.  Chronic NYHA class II symptoms and not volume overloaded on exam. Weight relatively stable, even after stopping phentermine. - MD recommended to stay off trastuzumab -based chemotherapy for now. Upcoming TTE to re-evaluate, patient will meet with MD afterwards to discuss. Troponin and BNP checked today were stable. - Continue Toprol  XL 12.5 qam/25 qpm.  Orthostatic at higher dose, report recently swapped low dose to the AM. - Continue valsartan  20 mg bid.  - Increase spironolactone  to 25 mg at bedtime.  BMET drawn this AM WNL. Repeat BMET in 7 days. - Continue Farxiga 10 mg daily.   - Echo in 12/2024, if EF is recovered MD to consider restarting trastuzumab -based chemo with understanding that echoes will have to be followed very closely given prior drops.   2. Smoking: I strongly encouraged her to completely quit.  3. Rheumatoid arthritis: On methotrexate and  hydroxychloroquine. Hydroxychloroquine can cause a cardiomyopathy but cardiac MRI did not show typical LGE pattern to suggest hydoxychloroquine CMP per MD.   Follow up: 1 month with MD  Please do not hesitate to reach out with questions or concerns,  Jaun Bash, PharmD, CPP, BCPS, [facility] Heart Failure Pharmacist  Phone - [phone] 12/17/2024 3:39 PM  "

## 2024-12-17 NOTE — Addendum Note (Signed)
 Addended by: DELSIE JAUN RAMAN on: 12/17/2024 03:50 PM   Modules accepted: Orders

## 2024-12-18 ENCOUNTER — Other Ambulatory Visit: Payer: Self-pay

## 2024-12-19 ENCOUNTER — Encounter: Payer: Self-pay | Admitting: Oncology

## 2024-12-24 ENCOUNTER — Encounter: Payer: Self-pay | Admitting: Oncology

## 2024-12-25 ENCOUNTER — Encounter: Payer: Self-pay | Admitting: Oncology

## 2024-12-26 ENCOUNTER — Telehealth: Payer: Self-pay | Admitting: Oncology

## 2024-12-26 NOTE — Telephone Encounter (Signed)
 Called pt to confirm CT for 1/19 - pt confirmed date/time/location - [facility]

## 2024-12-30 ENCOUNTER — Encounter: Payer: Self-pay | Admitting: Oncology

## 2024-12-30 ENCOUNTER — Ambulatory Visit
Admission: RE | Admit: 2024-12-30 | Discharge: 2024-12-30 | Disposition: A | Discharge status: Home / Self Care | Source: Ambulatory Visit | Attending: Oncology | Admitting: Oncology

## 2024-12-30 DIAGNOSIS — R948 Abnormal results of function studies of other organs and systems: Secondary | ICD-10-CM | POA: Diagnosis present

## 2024-12-30 MED ORDER — IOHEXOL 300 MG/ML  SOLN
75.0000 mL | Freq: Once | INTRAMUSCULAR | Status: AC | PRN
  Administered 2024-12-30: 75 mL via INTRAVENOUS

## 2024-12-30 MED ORDER — SODIUM CHLORIDE 0.9 % IV SOLN: INTRAVENOUS | Status: DC

## 2024-12-31 ENCOUNTER — Ambulatory Visit: Attending: Cardiology

## 2024-12-31 DIAGNOSIS — I428 Other cardiomyopathies: Secondary | ICD-10-CM

## 2024-12-31 LAB — ECHOCARDIOGRAM COMPLETE
AR max vel: 1.8 cm2
AV Area VTI: 1.76 cm2
AV Area mean vel: 1.73 cm2
AV Mean grad: 3 mmHg
AV Peak grad: 5 mmHg
Ao pk vel: 1.12 m/s
Area-P 1/2: 3.6 cm2
S' Lateral: 2.6 cm

## 2025-01-01 ENCOUNTER — Other Ambulatory Visit: Payer: Self-pay | Admitting: Cardiology

## 2025-01-02 ENCOUNTER — Other Ambulatory Visit: Payer: Self-pay

## 2025-01-02 ENCOUNTER — Telehealth: Payer: Self-pay | Admitting: Oncology

## 2025-01-02 NOTE — Telephone Encounter (Signed)
 Per MD virtual only on Monday due to weather,  I called and spoke with pt and confirmed virtual appt with MD and moved injection to Tuesday. Time confirmed.

## 2025-01-03 ENCOUNTER — Ambulatory Visit

## 2025-01-03 ENCOUNTER — Other Ambulatory Visit: Payer: Self-pay

## 2025-01-04 ENCOUNTER — Other Ambulatory Visit: Payer: Self-pay

## 2025-01-06 ENCOUNTER — Inpatient Hospital Stay: Attending: Oncology | Admitting: Oncology

## 2025-01-06 ENCOUNTER — Ambulatory Visit: Admitting: Cardiology

## 2025-01-06 ENCOUNTER — Inpatient Hospital Stay

## 2025-01-06 ENCOUNTER — Encounter: Payer: Self-pay | Admitting: Oncology

## 2025-01-06 DIAGNOSIS — C7951 Secondary malignant neoplasm of bone: Secondary | ICD-10-CM

## 2025-01-06 DIAGNOSIS — I428 Other cardiomyopathies: Secondary | ICD-10-CM | POA: Diagnosis not present

## 2025-01-06 DIAGNOSIS — G620 Drug-induced polyneuropathy: Secondary | ICD-10-CM

## 2025-01-06 DIAGNOSIS — Z170 Estrogen receptor positive status [ER+]: Secondary | ICD-10-CM | POA: Diagnosis not present

## 2025-01-06 DIAGNOSIS — T451X5A Adverse effect of antineoplastic and immunosuppressive drugs, initial encounter: Secondary | ICD-10-CM

## 2025-01-06 DIAGNOSIS — C50411 Malignant neoplasm of upper-outer quadrant of right female breast: Secondary | ICD-10-CM

## 2025-01-06 NOTE — Assessment & Plan Note (Signed)
 Chronic tachycardia Follow up with cardiology. Recent repeat Echo results were reviewed.

## 2025-01-06 NOTE — Assessment & Plan Note (Signed)
 continue Lyrica  and nortriptyline symptoms stable follow up with neurologist

## 2025-01-06 NOTE — Assessment & Plan Note (Signed)
 status post radiation. hold if Calcium  is <8.6 . Hold zometa  due to left lower extremity stress fracture. Awaiting clearance.  Continue calcium  supplementation.

## 2025-01-06 NOTE — Assessment & Plan Note (Addendum)
 Recurrent breast cancer with isolated bone metastasis, stage IV #Initially stage IIIA right breast cancer, ER positive, HER-2 negative status post bilateral oophorectomy,Right mastectomy and right axillary lymph node dissection, status post implant, and implant removal.  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 ER positive, HER-2 positive  On maintenance Kanjinti  Perjecta .- s/p Palliative radiation to T7  10/20/23 PET NED,10/17/23 normal bone density on DEXA 06/04/2024 PET NED 11/2024 PET scan showed small minimally hypermetabolic cervical, pre-vascular and possibly right internal mammary lymph nodes. Repeat CT in 12/2024 showed stable node.   Continue hold off Kanjinti  Perjecta, recent Echo showed improved LVEF 50-55%  Continue fulvestrant  monthly.

## 2025-01-06 NOTE — Progress Notes (Signed)
 HEMATOLOGY-ONCOLOGY TeleHEALTH VISIT PROGRESS NOTE  I connected with Theresa Chaney Hint on 01/06/25  at 10:15 AM EST by video enabled telemedicine visit and verified that I am speaking with the correct person using two identifiers. I discussed the limitations, risks, security and privacy concerns of performing an evaluation and management service by telemedicine and the availability of in-person appointments. The patient expressed understanding and agreed to proceed.   Other persons participating in the visit and their role in the encounter:  None  Patient's location: Home  Provider's location: office Chief Complaint: HER2 positive breast cancer   INTERVAL HISTORY Theresa Chaney is a 44 y.o. female who has above history reviewed by me today presents for follow up visit for management of HER2 positive breast cancer Problems and complaints are listed below:  Patient reports feeling well today.  She follows up with cardiology and has had a repeated echocardiogram which showed LVEF has improved to 50% to 55%. She has no new complaints today.  Review of Systems  Constitutional:  Negative for appetite change, chills, fatigue and fever.  HENT:   Negative for hearing loss and voice change.   Eyes:  Negative for eye problems.  Respiratory:  Negative for chest tightness and cough.   Cardiovascular:  Negative for chest pain.  Gastrointestinal:  Negative for abdominal distention, abdominal pain and blood in stool.  Endocrine: Negative for hot flashes.  Genitourinary:  Negative for difficulty urinating and frequency.   Musculoskeletal:  Positive for arthralgias and back pain.  Skin:  Negative for itching and rash.  Neurological:  Positive for numbness. Negative for extremity weakness.  Hematological:  Negative for adenopathy.  Psychiatric/Behavioral:  Negative for confusion.     Past Medical History:  Diagnosis Date   Anemia    Arthritis    BRCA negative 11/26/2017   Breast cancer (HCC)  10/11/2017   Multifocal, ER positive, PR negative, HER-2 negative. ypT3 ypN2a 8.7 cm, 4/15 nodes   Cardiomyopathy (HCC)    a. 10/2017 Echo: EF 60-65%, no rwma, Gr1 DD, nl RV size/fxn; b. 04/2018 Echo: EF 55-60%, no rwma, Nl RV size/fxn; c. 08/2018 Echo: EF 45%, diff HK, ? HK of antsept wall. Gr1 DD. Mild MR. Mild LAE/RAE. Mod dil RV.    Chronic bronchitis (HCC) 11/2017   COPD (chronic obstructive pulmonary disease) (HCC)    MILD PER CXR   Depression    Family history of cancer    GERD (gastroesophageal reflux disease)    Headache    MIGRAINES   Heart murmur    ASYMPTOMATIC   Personal history of chemotherapy    current for right breast ca   Past Surgical History:  Procedure Laterality Date   AXILLARY LYMPH NODE DISSECTION Right 03/19/2018   Procedure: AXILLARY LYMPH NODE DISSECTION;  Surgeon: Dessa Reyes ORN, MD;  Location: [facility];  Service: General;  Laterality: Right;   BIOPSY  06/23/2023   Procedure: BIOPSY;  Surgeon: Therisa Bi, MD;  Location: [facility];  Service: Gastroenterology;;   BREAST BIOPSY Right 10/11/2017   12:30 posterior coil clip invasive mammary carcinoma   BREAST BIOPSY Right 10/11/2017   11:30 middle depth ribbon clip DCIS   BREAST BIOPSY Right 10/11/2017   5:30 anterior depth x shape invasive ductal carcinoma   BREAST IMPLANT REMOVAL Right 06/03/2019   Procedure: REMOVAL OF RIGHT BREAST IMPLANTS;  Surgeon: Lowery Estefana RAMAN, DO;  Location: [facility];  Service: Plastics;  Laterality: Right;   BREAST RECONSTRUCTION WITH PLACEMENT OF TISSUE EXPANDER AND FLEX  HD (ACELLULAR HYDRATED DERMIS) Right 03/19/2018   Procedure: BREAST RECONSTRUCTION WITH PLACEMENT OF TISSUE EXPANDER AND FLEX HD (ACELLULAR HYDRATED DERMIS);  Surgeon: Lowery Estefana RAMAN, DO;  Location: [facility];  Service: Plastics;  Laterality: Right;   CARPAL TUNNEL RELEASE Bilateral 2020   CHOLECYSTECTOMY N/A 04/27/2020   Procedure: LAPAROSCOPIC CHOLECYSTECTOMY WITH INTRAOPERATIVE CHOLANGIOGRAM;   Surgeon: Dessa Reyes ORN, MD;  Location: [facility];  Service: General;  Laterality: N/A;   COLONOSCOPY WITH PROPOFOL  N/A 06/23/2023   Procedure: COLONOSCOPY WITH PROPOFOL ;  Surgeon: Therisa Bi, MD;  Location: [facility] ENDOSCOPY;  Service: Gastroenterology;  Laterality: N/A;   ESOPHAGOGASTRODUODENOSCOPY (EGD) WITH PROPOFOL  N/A 04/17/2020   Procedure: ESOPHAGOGASTRODUODENOSCOPY (EGD) WITH PROPOFOL ;  Surgeon: Dessa Reyes ORN, MD;  Location: [facility];  Service: Endoscopy;  Laterality: N/A;  with biopsy   ESOPHAGOGASTRODUODENOSCOPY (EGD) WITH PROPOFOL  N/A 06/23/2023   Procedure: ESOPHAGOGASTRODUODENOSCOPY (EGD) WITH PROPOFOL ;  Surgeon: Therisa Bi, MD;  Location: [facility];  Service: Gastroenterology;  Laterality: N/A;   IR CV LINE INJECTION  09/12/2024   LAPAROSCOPIC BILATERAL SALPINGO OOPHERECTOMY Bilateral 03/19/2018   Procedure: LAPAROSCOPIC BILATERAL SALPINGO OOPHORECTOMY;  Surgeon: Verdon Keen, MD;  Location: [facility];  Service: Gynecology;  Laterality: Bilateral;   MASTECTOMY Right 03/2018   MASTECTOMY W/ SENTINEL NODE BIOPSY Right 03/19/2018   Procedure: MASTECTOMY WITH SENTINEL LYMPH NODE BIOPSY;  Surgeon: Dessa Reyes ORN, MD;  Location: [facility];  Service: General;  Laterality: Right;   PORT-A-CATH REMOVAL Left 06/03/2019   Procedure: REMOVAL PORT-A-CATH;  Surgeon: Dessa Reyes ORN, MD;  Location: [facility];  Service: General;  Laterality: Left;   PORTACATH PLACEMENT Left 10/24/2017   Procedure: INSERTION PORT-A-CATH;  Surgeon: Dessa Reyes ORN, MD;  Location: [facility];  Service: General;  Laterality: Left;   PORTACATH PLACEMENT Right 09/28/2020   Procedure: INSERTION PORT-A-CATH;  Surgeon: Dessa Reyes ORN, MD;  Location: [facility];  Service: General;  Laterality: Right;   REMOVAL OF TISSUE EXPANDER AND PLACEMENT OF IMPLANT Right 07/20/2018   Procedure: REMOVAL OF RIGHT BREAST TISSUE EXPANDER AND PLACEMENT OF IMPLANT;  Surgeon: Lowery Estefana RAMAN, DO;  Location: [facility];  Service: Plastics;  Laterality: Right;   SIMPLE MASTECTOMY WITH AXILLARY SENTINEL NODE BIOPSY Left 06/03/2019   Procedure: SIMPLE MASTECTOMY LEFT;  Surgeon: Dessa Reyes ORN, MD;  Location: [facility];  Service: General;  Laterality: Left;    Family History  Problem Relation Age of Onset   Diabetes Father    Hypertension Father    Hyperlipidemia Father    Heart attack Father 13       mild   Melanoma Maternal Aunt        other aunts with BCC/SCC/Melanoma   Cervical cancer Maternal Aunt 75       daughter w/ cervical cancer as well   Lung cancer Maternal Aunt    Melanoma Maternal Uncle        other uncles with BCC/SCC/Melanoma   Breast cancer Paternal Aunt    Bladder Cancer Maternal Grandmother     Social History   Socioeconomic History   Marital status: Married    Spouse name: Not on file   Number of children: Not on file   Years of education: Not on file   Highest education level: Not on file  Occupational History   Occupation: pharmacy tech    Comment: [employer]   Tobacco Use   Smoking status: Former    Current packs/day: 0.00    Average packs/day: 0.5 packs/day for 18.0  years (9.0 ttl pk-yrs)    Types: Cigarettes    Start date: 06/21/2018    Quit date: 12/11/2022    Years since quitting: 2.0   Smokeless tobacco: Never  Vaping Use   Vaping status: Never Used  Substance and Sexual Activity   Alcohol use: No   Drug use: No   Sexual activity: Yes    Birth control/protection: Injection, Other-see comments    Comment: has had hysterectomy  Other Topics Concern   Not on file  Social History Narrative   Lives at home with husband and daughter   Social Drivers of Health   Tobacco Use: Medium Risk (11/14/2024)   Received from [facility]   Patient History    Smoking Tobacco Use: Former    Smokeless Tobacco Use: Never    Passive Exposure: Not on file  Financial Resource Strain: Not on file  Food  Insecurity: No Food Insecurity (12/25/2023)   Hunger Vital Sign    Worried About Running Out of Food in the Last Year: Never true    Ran Out of Food in the Last Year: Never true  Transportation Needs: No Transportation Needs (12/25/2023)   PRAPARE - Administrator, Civil Service (Medical): No    Lack of Transportation (Non-Medical): No  Physical Activity: Not on file  Stress: Not on file  Social Connections: Not on file  Intimate Partner Violence: Not on file  Depression (PHQ2-9): Low Risk (09/11/2024)   Depression (PHQ2-9)    PHQ-2 Score: 0  Alcohol Screen: Not on file  Housing: Unknown (12/25/2023)   Housing Stability Vital Sign    Unable to Pay for Housing in the Last Year: No    Number of Times Moved in the Last Year: Not on file    Homeless in the Last Year: No  Utilities: Not At Risk (12/25/2023)   [employer] Utilities    Threatened with loss of utilities: No  Health Literacy: Adequate Health Literacy (12/25/2023)   B1300 Health Literacy    Frequency of need for help with medical instructions: Never    Medications Ordered Prior to Encounter[1]  Allergies[2]     Observations/Objective: There were no vitals filed for this visit. There is no height or weight on file to calculate BMI.  Physical Exam Neurological:     Mental Status: She is alert.     CBC    Component Value Date/Time   WBC 7.0 09/11/2024 0806   RBC 3.50 (L) 09/11/2024 0806   HGB 10.6 (L) 09/11/2024 0806   HGB 11.2 (L) 03/22/2024 0808   HGB 11.5 04/26/2023 0850   HCT 31.6 (L) 09/11/2024 0806   HCT 35.4 04/26/2023 0850   PLT 248 09/11/2024 0806   PLT 271 03/22/2024 0808   PLT 334 01/22/2019 0915   MCV 90.3 09/11/2024 0806   MCV 89 04/26/2023 0850   MCH 30.3 09/11/2024 0806   MCHC 33.5 09/11/2024 0806   RDW 14.1 09/11/2024 0806   RDW 13.0 04/26/2023 0850   LYMPHSABS 2.0 09/11/2024 0806   LYMPHSABS 1.8 04/26/2023 0850   MONOABS 0.6 09/11/2024 0806   EOSABS 0.2 09/11/2024 0806   EOSABS 0.1  04/26/2023 0850   BASOSABS 0.0 09/11/2024 0806   BASOSABS 0.1 04/26/2023 0850    CMP     Component Value Date/Time   NA 140 12/17/2024 1112   NA 139 11/20/2024 0828   K 4.1 12/17/2024 1112   CL 104 12/17/2024 1112   CO2 25 12/17/2024 1112  GLUCOSE 113 (H) 12/17/2024 1112   BUN 18 12/17/2024 1112   BUN 18 11/20/2024 0828   CREATININE 0.87 12/17/2024 1112   CREATININE 1.01 (H) 04/19/2024 1007   CALCIUM  9.7 12/17/2024 1112   PROT 6.8 09/11/2024 0806   PROT 7.4 01/22/2019 0915   ALBUMIN 3.8 09/11/2024 0806   ALBUMIN 4.8 01/22/2019 0915   AST 27 09/11/2024 0806   AST 27 03/22/2024 0808   ALT 18 09/11/2024 0806   ALT 18 03/22/2024 0808   ALKPHOS 70 09/11/2024 0806   BILITOT 0.4 09/11/2024 0806   BILITOT 0.4 03/22/2024 0808   GFRNONAA >60 12/17/2024 1112   GFRNONAA >60 04/19/2024 1007   GFRAA >60 07/22/2020 1328      ASSESSMENT & PLAN:   Breast cancer of upper-outer quadrant of right female breast (HCC) Recurrent breast cancer with isolated bone metastasis, stage IV #Initially stage IIIA right breast cancer, ER positive, HER-2 negative status post bilateral oophorectomy,Right mastectomy and right axillary lymph node dissection, status post implant, and implant removal.  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 ER positive, HER-2 positive  On maintenance Kanjinti  Perjecta .- s/p Palliative radiation to T7  10/20/23 PET NED,10/17/23 normal bone density on DEXA 06/04/2024 PET NED 11/2024 PET scan showed small minimally hypermetabolic cervical, pre-vascular and possibly right internal mammary lymph nodes. Repeat CT in 12/2024 showed stable node.   Continue hold off Kanjinti  Perjecta, recent Echo showed improved LVEF 50-55%  Continue fulvestrant  monthly.   Malignant neoplasm metastatic to bone [facility]) status post radiation. hold if Calcium  is <8.6 . Hold zometa  due to left lower extremity stress  fracture. Awaiting clearance.  Continue calcium  supplementation.    Neuropathy due to chemotherapeutic drug continue Lyrica  and nortriptyline symptoms stable follow up with neurologist   Nonischemic cardiomyopathy (HCC) Chronic tachycardia Follow up with cardiology. Recent repeat Echo results were reviewed.    Orders Placed This Encounter  Procedures   CBC (Cancer Center Only)    Standing Status:   Future    Expected Date:   03/31/2025    Expiration Date:   06/29/2025   Comprehensive metabolic panel with GFR    Standing Status:   Future    Expected Date:   03/31/2025    Expiration Date:   06/29/2025   Follow-up-monthly follow-up as planned.  Follow-up in 3 months.  I discussed the assessment and treatment plan with the patient. The patient was provided an opportunity to ask questions and all were answered. The patient agreed with the plan and demonstrated an understanding of the instructions.  The patient was advised to call back or seek an in-person evaluation if the symptoms worsen or if the condition fails to improve as anticipated.    Zelphia Cap, MD 01/06/2025 10:49 AM       [1]  Current Outpatient Medications on File Prior to Visit  Medication Sig Dispense Refill   acetaminophen  (TYLENOL ) 500 MG tablet Take 500 mg by mouth every 6 (six) hours as needed.     albuterol  (VENTOLIN  HFA) 108 (90 Base) MCG/ACT inhaler Inhale 2 puffs into the lungs every 6 (six) hours as needed for wheezing or shortness of breath.      CALCIUM  CARBONATE-VITAMIN D  PO Take 600-800 mg by mouth in the morning, at noon, and at bedtime.     cetirizine (ZYRTEC) 10 MG tablet Take 10 mg by mouth daily.     chlorhexidine  (PERIDEX ) 0.12 % solution Use as directed 15  mLs in the mouth or throat 2 (two) times daily. 120 mL 0   cyanocobalamin  (,VITAMIN B-12,) 1000 MCG/ML injection Inject 1,000 mcg into the skin every 30 (thirty) days.     diphenoxylate -atropine  (LOMOTIL ) 2.5-0.025 MG tablet Take 1 tablet by mouth 4  (four) times daily as needed for diarrhea or loose stools. 60 tablet 0   empagliflozin  (JARDIANCE ) 10 MG TABS tablet Take 1 tablet (10 mg total) by mouth daily before breakfast. 90 tablet 2   escitalopram  (LEXAPRO ) 20 MG tablet Take 20 mg by mouth daily.     esomeprazole (NEXIUM) 40 MG capsule Take 40 mg by mouth daily before breakfast.      Eszopiclone 3 MG TABS Take 3 mg by mouth at bedtime as needed (sleep).      folic acid  (FOLVITE ) 1 MG tablet Take 1 mg by mouth daily.     hydrocortisone 2.5 % cream      hydroxychloroquine (PLAQUENIL) 200 MG tablet Take 200 mg by mouth daily.     hydrOXYzine  (ATARAX ) 10 MG tablet Take 1 tablet (10 mg total) by mouth every 6 (six) hours as needed. 60 tablet 0   loperamide  (IMODIUM ) 2 MG capsule Take 1 tablet (2 mg total) by mouth See admin instructions. Take 2 tablets with onset of diarrhea, then take 1 tablet every 2 hours until diarrhea stops. Maximum 8 tablets in 24 90 capsule 0   LORazepam (ATIVAN) 1 MG tablet Take 1 mg by mouth 3 (three) times daily.     Magnesium 500 MG TABS Take 500 mg by mouth 2 (two) times daily. (Patient not taking: Reported on 11/20/2024)     methocarbamol  (ROBAXIN ) 500 MG tablet Take by mouth.     methotrexate (RHEUMATREX) 2.5 MG tablet Take 25 mg by mouth every Sunday. 10 tablets once a week     metoprolol  succinate (TOPROL  XL) 25 MG 24 hr tablet Take 1 tablet (25 mg total) by mouth 2 (two) times daily. Monitor for lightheadedness and dizziness. 180 tablet 1   mupirocin  ointment (BACTROBAN ) 2 % Place 1 application into the nose 2 (two) times daily. Use in each nostril twice daily for five (5) days. 22 g 5   nortriptyline (PAMELOR) 10 MG capsule Take 30 mg by mouth at bedtime.      oxyCODONE  (OXY IR/ROXICODONE ) 5 MG immediate release tablet Take 1 tablet (5 mg total) by mouth every 6 (six) hours as needed for moderate pain (pain score 4-6) or severe pain (pain score 7-10). 90 tablet 0   pregabalin  (LYRICA ) 150 MG capsule Take 150 mg  by mouth 2 (two) times daily.     promethazine  (PHENERGAN ) 25 MG tablet Take 1 tablet (25 mg total) by mouth every 8 (eight) hours as needed for nausea or vomiting. 90 tablet 0   pyridOXINE (VITAMIN B-6) 100 MG tablet Take 100 mg by mouth daily.     spironolactone  (ALDACTONE ) 25 MG tablet Take 1 tablet (25 mg total) by mouth at bedtime. 90 tablet 3   topiramate (TOPAMAX) 50 MG tablet Take 50 mg by mouth 2 (two) times daily.      valsartan  (DIOVAN ) 40 MG tablet Take 0.5 tablets (20 mg total) by mouth 2 (two) times daily. 90 tablet 3   vitamin C (ASCORBIC ACID) 500 MG tablet Take 500 mg by mouth daily.     Current Facility-Administered Medications on File Prior to Visit  Medication Dose Route Frequency Provider Last Rate Last Admin   heparin  lock flush 100 unit/mL  500 Units Intravenous Once Babara Call, MD       sodium chloride  flush (NS) 0.9 % injection 10 mL  10 mL Intravenous PRN Babara Call, MD   10 mL at 11/19/18 1249   sodium chloride  flush (NS) 0.9 % injection 10 mL  10 mL Intravenous Once Babara Call, MD      [2] No Known Allergies

## 2025-01-07 ENCOUNTER — Inpatient Hospital Stay

## 2025-01-07 ENCOUNTER — Encounter: Payer: Self-pay | Admitting: Oncology

## 2025-01-07 ENCOUNTER — Other Ambulatory Visit: Payer: Self-pay

## 2025-01-07 DIAGNOSIS — C7951 Secondary malignant neoplasm of bone: Secondary | ICD-10-CM

## 2025-01-07 DIAGNOSIS — Z170 Estrogen receptor positive status [ER+]: Secondary | ICD-10-CM

## 2025-01-07 MED ORDER — OXYCODONE HCL 5 MG PO TABS: 5.0000 mg | ORAL_TABLET | Freq: Four times a day (QID) | ORAL | 0 refills | Status: AC | PRN

## 2025-01-09 ENCOUNTER — Ambulatory Visit: Admitting: Cardiology

## 2025-01-10 ENCOUNTER — Inpatient Hospital Stay

## 2025-01-10 DIAGNOSIS — Z170 Estrogen receptor positive status [ER+]: Secondary | ICD-10-CM

## 2025-01-10 MED ORDER — FULVESTRANT 250 MG/5ML IM SOSY
500.0000 mg | PREFILLED_SYRINGE | INTRAMUSCULAR | Status: DC
  Administered 2025-01-10: 500 mg via INTRAMUSCULAR
  Filled 2025-01-10: qty 10

## 2025-01-12 ENCOUNTER — Other Ambulatory Visit: Payer: Self-pay

## 2025-02-03 ENCOUNTER — Inpatient Hospital Stay

## 2025-02-04 ENCOUNTER — Inpatient Hospital Stay

## 2025-03-03 ENCOUNTER — Inpatient Hospital Stay

## 2025-03-04 ENCOUNTER — Inpatient Hospital Stay

## 2025-03-19 ENCOUNTER — Other Ambulatory Visit

## 2025-03-27 ENCOUNTER — Ambulatory Visit: Admitting: Cardiology

## 2025-03-31 ENCOUNTER — Inpatient Hospital Stay: Admitting: Oncology

## 2025-03-31 ENCOUNTER — Inpatient Hospital Stay

## 2025-04-01 ENCOUNTER — Inpatient Hospital Stay

## 2025-04-01 ENCOUNTER — Inpatient Hospital Stay: Admitting: Oncology
